# Patient Record
Sex: Male | Born: 1960 | Race: Black or African American | Hispanic: No | Marital: Single | Smoking: Never smoker
Health system: Southern US, Community
[De-identification: ages and names within clinical notes are randomized; demographics above are authoritative.]

## PROBLEM LIST (undated history)

## (undated) DIAGNOSIS — E119 Type 2 diabetes mellitus without complications: Secondary | ICD-10-CM

## (undated) DIAGNOSIS — I480 Paroxysmal atrial fibrillation: Secondary | ICD-10-CM

## (undated) DIAGNOSIS — I428 Other cardiomyopathies: Secondary | ICD-10-CM

## (undated) DIAGNOSIS — K633 Ulcer of intestine: Secondary | ICD-10-CM

## (undated) DIAGNOSIS — E669 Obesity, unspecified: Secondary | ICD-10-CM

## (undated) DIAGNOSIS — I219 Acute myocardial infarction, unspecified: Secondary | ICD-10-CM

## (undated) DIAGNOSIS — K559 Vascular disorder of intestine, unspecified: Secondary | ICD-10-CM

## (undated) DIAGNOSIS — H269 Unspecified cataract: Secondary | ICD-10-CM

## (undated) DIAGNOSIS — N183 Chronic kidney disease, stage 3 unspecified: Secondary | ICD-10-CM

## (undated) DIAGNOSIS — D126 Benign neoplasm of colon, unspecified: Secondary | ICD-10-CM

## (undated) DIAGNOSIS — E785 Hyperlipidemia, unspecified: Secondary | ICD-10-CM

## (undated) DIAGNOSIS — H35039 Hypertensive retinopathy, unspecified eye: Secondary | ICD-10-CM

## (undated) DIAGNOSIS — I2510 Atherosclerotic heart disease of native coronary artery without angina pectoris: Secondary | ICD-10-CM

## (undated) DIAGNOSIS — G4730 Sleep apnea, unspecified: Secondary | ICD-10-CM

## (undated) DIAGNOSIS — Z86010 Personal history of colonic polyps: Secondary | ICD-10-CM

## (undated) DIAGNOSIS — J352 Hypertrophy of adenoids: Secondary | ICD-10-CM

## (undated) DIAGNOSIS — M1990 Unspecified osteoarthritis, unspecified site: Secondary | ICD-10-CM

## (undated) DIAGNOSIS — K219 Gastro-esophageal reflux disease without esophagitis: Secondary | ICD-10-CM

## (undated) DIAGNOSIS — I10 Essential (primary) hypertension: Secondary | ICD-10-CM

## (undated) DIAGNOSIS — E11319 Type 2 diabetes mellitus with unspecified diabetic retinopathy without macular edema: Secondary | ICD-10-CM

## (undated) HISTORY — PX: POLYPECTOMY: SHX149

## (undated) HISTORY — DX: Hyperlipidemia, unspecified: E78.5

## (undated) HISTORY — DX: Chronic kidney disease, stage 3 unspecified: N18.30

## (undated) HISTORY — DX: Paroxysmal atrial fibrillation: I48.0

## (undated) HISTORY — DX: Benign neoplasm of colon, unspecified: D12.6

## (undated) HISTORY — DX: Ulcer of intestine: K63.3

## (undated) HISTORY — DX: Type 2 diabetes mellitus with unspecified diabetic retinopathy without macular edema: E11.319

## (undated) HISTORY — DX: Hypertrophy of adenoids: J35.2

## (undated) HISTORY — PX: COLONOSCOPY: SHX174

## (undated) HISTORY — DX: Obesity, unspecified: E66.9

## (undated) HISTORY — DX: Essential (primary) hypertension: I10

## (undated) HISTORY — PX: MUSCLE BIOPSY: SHX716

## (undated) HISTORY — DX: Gastro-esophageal reflux disease without esophagitis: K21.9

## (undated) HISTORY — DX: Personal history of colonic polyps: Z86.010

## (undated) HISTORY — DX: Unspecified cataract: H26.9

## (undated) HISTORY — DX: Hypertensive retinopathy, unspecified eye: H35.039

## (undated) HISTORY — DX: Atherosclerotic heart disease of native coronary artery without angina pectoris: I25.10

## (undated) HISTORY — DX: Unspecified osteoarthritis, unspecified site: M19.90

## (undated) HISTORY — DX: Sleep apnea, unspecified: G47.30

## (undated) HISTORY — PX: FOREARM SURGERY: SHX651

## (undated) HISTORY — DX: Vascular disorder of intestine, unspecified: K55.9

## (undated) MED ORDER — INSULIN LISPRO 100 UNIT/ML INJECTION
100 unit/mL | Freq: Three times a day (TID) | SUBCUTANEOUS | Status: DC
Start: ? — End: 2013-08-06

---

## 1998-05-24 ENCOUNTER — Emergency Department (HOSPITAL_COMMUNITY): Admission: EM | Admit: 1998-05-24 | Discharge: 1998-05-24 | Payer: Self-pay | Admitting: Emergency Medicine

## 2001-05-16 ENCOUNTER — Emergency Department (HOSPITAL_COMMUNITY): Admission: EM | Admit: 2001-05-16 | Discharge: 2001-05-16 | Payer: Self-pay | Admitting: Emergency Medicine

## 2001-05-16 ENCOUNTER — Encounter: Payer: Self-pay | Admitting: Emergency Medicine

## 2001-09-20 ENCOUNTER — Emergency Department (HOSPITAL_COMMUNITY): Admission: EM | Admit: 2001-09-20 | Discharge: 2001-09-21 | Payer: Self-pay | Admitting: Emergency Medicine

## 2001-09-20 ENCOUNTER — Encounter: Payer: Self-pay | Admitting: Emergency Medicine

## 2001-09-22 ENCOUNTER — Emergency Department (HOSPITAL_COMMUNITY): Admission: EM | Admit: 2001-09-22 | Discharge: 2001-09-23 | Payer: Self-pay | Admitting: Emergency Medicine

## 2001-09-22 ENCOUNTER — Encounter: Payer: Self-pay | Admitting: Emergency Medicine

## 2001-11-10 ENCOUNTER — Encounter: Payer: Self-pay | Admitting: Emergency Medicine

## 2001-11-10 ENCOUNTER — Emergency Department (HOSPITAL_COMMUNITY): Admission: EM | Admit: 2001-11-10 | Discharge: 2001-11-10 | Payer: Self-pay | Admitting: Emergency Medicine

## 2002-01-06 ENCOUNTER — Encounter: Payer: Self-pay | Admitting: Emergency Medicine

## 2002-01-06 ENCOUNTER — Emergency Department (HOSPITAL_COMMUNITY): Admission: EM | Admit: 2002-01-06 | Discharge: 2002-01-06 | Payer: Self-pay | Admitting: Emergency Medicine

## 2002-06-20 ENCOUNTER — Ambulatory Visit (HOSPITAL_COMMUNITY): Admission: RE | Admit: 2002-06-20 | Discharge: 2002-06-20 | Payer: Self-pay | Admitting: Internal Medicine

## 2002-06-20 ENCOUNTER — Encounter: Payer: Self-pay | Admitting: Internal Medicine

## 2004-05-03 ENCOUNTER — Emergency Department (HOSPITAL_COMMUNITY): Admission: EM | Admit: 2004-05-03 | Discharge: 2004-05-03 | Payer: Self-pay | Admitting: Emergency Medicine

## 2006-01-01 ENCOUNTER — Emergency Department (HOSPITAL_COMMUNITY): Admission: EM | Admit: 2006-01-01 | Discharge: 2006-01-01 | Payer: Self-pay | Admitting: Emergency Medicine

## 2006-05-12 ENCOUNTER — Encounter: Admission: RE | Admit: 2006-05-12 | Discharge: 2006-05-12 | Payer: Self-pay | Admitting: Neurology

## 2006-06-17 ENCOUNTER — Encounter: Admission: RE | Admit: 2006-06-17 | Discharge: 2006-06-17 | Payer: Self-pay | Admitting: Neurology

## 2007-10-03 ENCOUNTER — Emergency Department (HOSPITAL_COMMUNITY): Admission: EM | Admit: 2007-10-03 | Discharge: 2007-10-04 | Payer: Self-pay | Admitting: Emergency Medicine

## 2009-08-03 ENCOUNTER — Emergency Department (HOSPITAL_COMMUNITY): Admission: EM | Admit: 2009-08-03 | Discharge: 2009-08-03 | Payer: Self-pay | Admitting: Family Medicine

## 2009-10-20 ENCOUNTER — Emergency Department (HOSPITAL_COMMUNITY): Admission: EM | Admit: 2009-10-20 | Discharge: 2009-10-20 | Payer: Self-pay | Admitting: Family Medicine

## 2009-10-20 ENCOUNTER — Observation Stay (HOSPITAL_COMMUNITY): Admission: EM | Admit: 2009-10-20 | Discharge: 2009-10-21 | Payer: Self-pay | Admitting: Emergency Medicine

## 2010-02-12 ENCOUNTER — Inpatient Hospital Stay (HOSPITAL_COMMUNITY): Admission: EM | Admit: 2010-02-12 | Discharge: 2010-02-16 | Payer: Self-pay | Admitting: Emergency Medicine

## 2010-07-04 DIAGNOSIS — D126 Benign neoplasm of colon, unspecified: Secondary | ICD-10-CM

## 2010-07-04 HISTORY — DX: Benign neoplasm of colon, unspecified: D12.6

## 2010-09-17 LAB — COMPREHENSIVE METABOLIC PANEL
ALT: 32 U/L (ref 0–53)
AST: 28 U/L (ref 0–37)
Albumin: 3.1 g/dL — ABNORMAL LOW (ref 3.5–5.2)
Alkaline Phosphatase: 85 U/L (ref 39–117)
BUN: 9 mg/dL (ref 6–23)
CO2: 25 mEq/L (ref 19–32)
Calcium: 9.1 mg/dL (ref 8.4–10.5)
Chloride: 101 mEq/L (ref 96–112)
Creatinine, Ser: 1.17 mg/dL (ref 0.4–1.5)
GFR calc Af Amer: >60 mL/min (ref >60)
GFR calc non Af Amer: >60 mL/min (ref >60)
Glucose, Bld: 208 mg/dL — ABNORMAL HIGH (ref 70–99)
Potassium: 3.7 mEq/L (ref 3.5–5.1)
Sodium: 137 mEq/L (ref 135–145)
Total Bilirubin: 0.4 mg/dL (ref 0.3–1.2)
Total Protein: 5.9 g/dL — ABNORMAL LOW (ref 6.0–8.3)

## 2010-09-17 LAB — DIFFERENTIAL
Basophils Absolute: 0 10*3/uL (ref 0.0–0.1)
Basophils Absolute: 0 10*3/uL (ref 0.0–0.1)
Basophils Relative: 0 % (ref 0–1)
Basophils Relative: 0 % (ref 0–1)
Eosinophils Absolute: 0.1 10*3/uL (ref 0.0–0.7)
Eosinophils Absolute: 0.1 10*3/uL (ref 0.0–0.7)
Eosinophils Relative: 2 % (ref 0–5)
Eosinophils Relative: 1 % (ref 0–5)
Lymphocytes Relative: 42 % (ref 12–46)
Lymphocytes Relative: 36 % (ref 12–46)
Lymphs Abs: 2.7 10*3/uL (ref 0.7–4.0)
Lymphs Abs: 2.7 10*3/uL (ref 0.7–4.0)
Monocytes Absolute: 0.5 10*3/uL (ref 0.1–1.0)
Monocytes Absolute: 0.4 10*3/uL (ref 0.1–1.0)
Monocytes Relative: 8 % (ref 3–12)
Monocytes Relative: 6 % (ref 3–12)
Neutro Abs: 3.1 10*3/uL (ref 1.7–7.7)
Neutro Abs: 4.3 10*3/uL (ref 1.7–7.7)
Neutrophils Relative %: 48 % (ref 43–77)
Neutrophils Relative %: 57 % (ref 43–77)

## 2010-09-17 LAB — GLUCOSE, CAPILLARY
Glucose-Capillary: 162 mg/dL — ABNORMAL HIGH (ref 70–99)
Glucose-Capillary: 168 mg/dL — ABNORMAL HIGH (ref 70–99)
Glucose-Capillary: 215 mg/dL — ABNORMAL HIGH (ref 70–99)
Glucose-Capillary: 216 mg/dL — ABNORMAL HIGH (ref 70–99)
Glucose-Capillary: 246 mg/dL — ABNORMAL HIGH (ref 70–99)
Glucose-Capillary: 115 mg/dL — ABNORMAL HIGH (ref 70–99)
Glucose-Capillary: 152 mg/dL — ABNORMAL HIGH (ref 70–99)
Glucose-Capillary: 157 mg/dL — ABNORMAL HIGH (ref 70–99)
Glucose-Capillary: 224 mg/dL — ABNORMAL HIGH (ref 70–99)
Glucose-Capillary: 258 mg/dL — ABNORMAL HIGH (ref 70–99)
Glucose-Capillary: 259 mg/dL — ABNORMAL HIGH (ref 70–99)
Glucose-Capillary: 299 mg/dL — ABNORMAL HIGH (ref 70–99)
Glucose-Capillary: 310 mg/dL — ABNORMAL HIGH (ref 70–99)
Glucose-Capillary: 358 mg/dL — ABNORMAL HIGH (ref 70–99)
Glucose-Capillary: 363 mg/dL — ABNORMAL HIGH (ref 70–99)
Glucose-Capillary: 393 mg/dL — ABNORMAL HIGH (ref 70–99)
Glucose-Capillary: 446 mg/dL — ABNORMAL HIGH (ref 70–99)
Glucose-Capillary: 480 mg/dL — ABNORMAL HIGH (ref 70–99)
Glucose-Capillary: 519 mg/dL — ABNORMAL HIGH (ref 70–99)

## 2010-09-17 LAB — POCT CARDIAC MARKERS
CKMB, poc: 1.2 ng/mL (ref 1.0–8.0)
CKMB, poc: 2.1 ng/mL (ref 1.0–8.0)
Myoglobin, poc: 143 ng/mL (ref 12–200)
Myoglobin, poc: 156 ng/mL (ref 12–200)
Troponin i, poc: <0.05 ng/mL (ref 0.00–0.09)
Troponin i, poc: <0.05 ng/mL (ref 0.00–0.09)

## 2010-09-17 LAB — POCT I-STAT, CHEM 8
BUN: 17 mg/dL (ref 6–23)
Calcium, Ion: 1.04 mmol/L — ABNORMAL LOW (ref 1.12–1.32)
Chloride: 102 mEq/L (ref 96–112)
Creatinine, Ser: 1.3 mg/dL (ref 0.4–1.5)
Glucose, Bld: 531 mg/dL — ABNORMAL HIGH (ref 70–99)
HCT: 49 % (ref 39.0–52.0)
Hemoglobin: 16.7 g/dL (ref 13.0–17.0)
Potassium: 4.2 mEq/L (ref 3.5–5.1)
Sodium: 130 mEq/L — ABNORMAL LOW (ref 135–145)
TCO2: 22 mmol/L (ref 0–100)

## 2010-09-17 LAB — URINALYSIS, ROUTINE W REFLEX MICROSCOPIC
Bilirubin Urine: NEGATIVE
Glucose, UA: >1000 mg/dL — AB
Hgb urine dipstick: NEGATIVE
Ketones, ur: 15 mg/dL — AB
Nitrite: NEGATIVE
Protein, ur: NEGATIVE mg/dL
Specific Gravity, Urine: 1.038 — ABNORMAL HIGH (ref 1.005–1.030)
Urobilinogen, UA: 0.2 mg/dL (ref 0.0–1.0)
pH: 6 (ref 5.0–8.0)

## 2010-09-17 LAB — CBC
HCT: 44.6 % (ref 39.0–52.0)
HCT: 45.5 % (ref 39.0–52.0)
Hemoglobin: 15.8 g/dL (ref 13.0–17.0)
Hemoglobin: 16.3 g/dL (ref 13.0–17.0)
MCH: 30.8 pg (ref 26.0–34.0)
MCH: 30.4 pg (ref 26.0–34.0)
MCHC: 35.4 g/dL (ref 30.0–36.0)
MCHC: 35.8 g/dL (ref 30.0–36.0)
MCV: 86.9 fL (ref 78.0–100.0)
MCV: 84.9 fL (ref 78.0–100.0)
Platelets: 198 10*3/uL (ref 150–400)
Platelets: 207 10*3/uL (ref 150–400)
RBC: 5.13 MIL/uL (ref 4.22–5.81)
RBC: 5.36 MIL/uL (ref 4.22–5.81)
RDW: 12.7 % (ref 11.5–15.5)
RDW: 12.3 % (ref 11.5–15.5)
WBC: 6.4 10*3/uL (ref 4.0–10.5)
WBC: 7.5 10*3/uL (ref 4.0–10.5)

## 2010-09-17 LAB — URINE MICROSCOPIC-ADD ON

## 2010-09-17 LAB — URINE CULTURE
Colony Count: 85000
Culture  Setup Time: 201108130309

## 2010-09-17 LAB — HEMOGLOBIN A1C
Hgb A1c MFr Bld: 13.4 % — ABNORMAL HIGH (ref <5.7)
Hgb A1c MFr Bld: 13.6 % — ABNORMAL HIGH (ref <5.7)
Mean Plasma Glucose: 338 mg/dL — ABNORMAL HIGH (ref <117)
Mean Plasma Glucose: 344 mg/dL — ABNORMAL HIGH (ref <117)

## 2010-09-17 LAB — GLUCOSE, RANDOM: Glucose, Bld: 391 mg/dL — ABNORMAL HIGH (ref 70–99)

## 2010-09-17 LAB — GC/CHLAMYDIA PROBE AMP, URINE
Chlamydia, Swab/Urine, PCR: NEGATIVE
GC Probe Amp, Urine: NEGATIVE

## 2010-09-17 LAB — C-PEPTIDE: C-Peptide: 1.33 ng/mL (ref 0.80–3.90)

## 2010-09-17 LAB — URIC ACID: Uric Acid, Serum: 4.8 mg/dL (ref 4.0–7.8)

## 2010-09-17 LAB — HIV ANTIBODY (ROUTINE TESTING W REFLEX): HIV: NONREACTIVE

## 2010-09-17 LAB — C-REACTIVE PROTEIN: CRP: 1.4 mg/dL — ABNORMAL HIGH (ref <0.6)

## 2010-09-17 LAB — SEDIMENTATION RATE: Sed Rate: 7 mm/hr (ref 0–16)

## 2010-09-19 LAB — POCT I-STAT, CHEM 8
BUN: 11 mg/dL (ref 6–23)
Calcium, Ion: 1.1 mmol/L — ABNORMAL LOW (ref 1.12–1.32)
Chloride: 103 mEq/L (ref 96–112)
Creatinine, Ser: 1.5 mg/dL (ref 0.4–1.5)
Glucose, Bld: 266 mg/dL — ABNORMAL HIGH (ref 70–99)
HCT: 53 % — ABNORMAL HIGH (ref 39.0–52.0)
Hemoglobin: 18 g/dL — ABNORMAL HIGH (ref 13.0–17.0)
Potassium: 4 mEq/L (ref 3.5–5.1)
Sodium: 134 mEq/L — ABNORMAL LOW (ref 135–145)
TCO2: 25 mmol/L (ref 0–100)

## 2010-09-21 LAB — COMPREHENSIVE METABOLIC PANEL
ALT: 28 U/L (ref 0–53)
AST: 24 U/L (ref 0–37)
Albumin: 3.2 g/dL — ABNORMAL LOW (ref 3.5–5.2)
Alkaline Phosphatase: 70 U/L (ref 39–117)
BUN: 11 mg/dL (ref 6–23)
CO2: 27 mEq/L (ref 19–32)
Calcium: 8.6 mg/dL (ref 8.4–10.5)
Chloride: 107 mEq/L (ref 96–112)
Creatinine, Ser: 1.14 mg/dL (ref 0.4–1.5)
GFR calc Af Amer: >60 mL/min (ref >60)
GFR calc non Af Amer: >60 mL/min (ref >60)
Glucose, Bld: 249 mg/dL — ABNORMAL HIGH (ref 70–99)
Potassium: 4 mEq/L (ref 3.5–5.1)
Sodium: 137 mEq/L (ref 135–145)
Total Bilirubin: 0.7 mg/dL (ref 0.3–1.2)
Total Protein: 5.7 g/dL — ABNORMAL LOW (ref 6.0–8.3)

## 2010-09-21 LAB — POCT CARDIAC MARKERS
CKMB, poc: 1 ng/mL (ref 1.0–8.0)
CKMB, poc: <1 ng/mL — ABNORMAL LOW (ref 1.0–8.0)
Myoglobin, poc: 62.7 ng/mL (ref 12–200)
Myoglobin, poc: 65.8 ng/mL (ref 12–200)
Troponin i, poc: <0.05 ng/mL (ref 0.00–0.09)
Troponin i, poc: <0.05 ng/mL (ref 0.00–0.09)

## 2010-09-21 LAB — POCT I-STAT, CHEM 8
BUN: 10 mg/dL (ref 6–23)
Calcium, Ion: 1.16 mmol/L (ref 1.12–1.32)
Chloride: 104 mEq/L (ref 96–112)
Creatinine, Ser: 1.1 mg/dL (ref 0.4–1.5)
Glucose, Bld: 350 mg/dL — ABNORMAL HIGH (ref 70–99)
HCT: 48 % (ref 39.0–52.0)
Hemoglobin: 16.3 g/dL (ref 13.0–17.0)
Potassium: 3.6 mEq/L (ref 3.5–5.1)
Sodium: 136 mEq/L (ref 135–145)
TCO2: 23 mmol/L (ref 0–100)

## 2010-09-21 LAB — URINALYSIS, ROUTINE W REFLEX MICROSCOPIC
Bilirubin Urine: NEGATIVE
Glucose, UA: >1000 mg/dL — AB
Hgb urine dipstick: NEGATIVE
Ketones, ur: NEGATIVE mg/dL
Leukocytes, UA: NEGATIVE
Nitrite: NEGATIVE
Protein, ur: NEGATIVE mg/dL
Specific Gravity, Urine: 1.025 (ref 1.005–1.030)
Urobilinogen, UA: 0.2 mg/dL (ref 0.0–1.0)
pH: 5.5 (ref 5.0–8.0)

## 2010-09-21 LAB — GLUCOSE, CAPILLARY
Glucose-Capillary: 241 mg/dL — ABNORMAL HIGH (ref 70–99)
Glucose-Capillary: 253 mg/dL — ABNORMAL HIGH (ref 70–99)
Glucose-Capillary: 260 mg/dL — ABNORMAL HIGH (ref 70–99)
Glucose-Capillary: 272 mg/dL — ABNORMAL HIGH (ref 70–99)

## 2010-09-21 LAB — URINE MICROSCOPIC-ADD ON

## 2010-09-21 LAB — DIFFERENTIAL
Basophils Absolute: 0.1 10*3/uL (ref 0.0–0.1)
Basophils Relative: 1 % (ref 0–1)
Eosinophils Absolute: 0.1 10*3/uL (ref 0.0–0.7)
Eosinophils Relative: 1 % (ref 0–5)
Lymphocytes Relative: 32 % (ref 12–46)
Lymphs Abs: 2.5 10*3/uL (ref 0.7–4.0)
Monocytes Absolute: 0.3 10*3/uL (ref 0.1–1.0)
Monocytes Relative: 4 % (ref 3–12)
Neutro Abs: 5 10*3/uL (ref 1.7–7.7)
Neutrophils Relative %: 63 % (ref 43–77)

## 2010-09-21 LAB — CBC
HCT: 42.3 % (ref 39.0–52.0)
Hemoglobin: 14.4 g/dL (ref 13.0–17.0)
MCHC: 34 g/dL (ref 30.0–36.0)
MCV: 91.9 fL (ref 78.0–100.0)
Platelets: 186 10*3/uL (ref 150–400)
RBC: 4.6 MIL/uL (ref 4.22–5.81)
RDW: 13.4 % (ref 11.5–15.5)
WBC: 7.9 10*3/uL (ref 4.0–10.5)

## 2010-09-21 LAB — LIPASE, BLOOD: Lipase: 30 U/L (ref 11–59)

## 2010-12-15 ENCOUNTER — Encounter (HOSPITAL_COMMUNITY)
Admission: RE | Admit: 2010-12-15 | Discharge: 2010-12-15 | Disposition: A | Discharge status: Home / Self Care | Payer: BC Managed Care – PPO | Source: Ambulatory Visit | Attending: General Surgery | Admitting: General Surgery

## 2010-12-15 LAB — URINALYSIS, ROUTINE W REFLEX MICROSCOPIC
Bilirubin Urine: NEGATIVE
Glucose, UA: >1000 mg/dL — AB
Hgb urine dipstick: NEGATIVE
Ketones, ur: NEGATIVE mg/dL
Leukocytes, UA: NEGATIVE
Nitrite: NEGATIVE
Protein, ur: NEGATIVE mg/dL
Specific Gravity, Urine: 1.032 — ABNORMAL HIGH (ref 1.005–1.030)
Urobilinogen, UA: 0.2 mg/dL (ref 0.0–1.0)
pH: 5.5 (ref 5.0–8.0)

## 2010-12-15 LAB — CBC
HCT: 43.8 % (ref 39.0–52.0)
Hemoglobin: 15.5 g/dL (ref 13.0–17.0)
MCH: 30.6 pg (ref 26.0–34.0)
MCHC: 35.4 g/dL (ref 30.0–36.0)
MCV: 86.6 fL (ref 78.0–100.0)
Platelets: 220 10*3/uL (ref 150–400)
RBC: 5.06 MIL/uL (ref 4.22–5.81)
RDW: 12.7 % (ref 11.5–15.5)
WBC: 8.5 10*3/uL (ref 4.0–10.5)

## 2010-12-15 LAB — COMPREHENSIVE METABOLIC PANEL
ALT: 23 U/L (ref 0–53)
AST: 14 U/L (ref 0–37)
Albumin: 3.8 g/dL (ref 3.5–5.2)
Alkaline Phosphatase: 102 U/L (ref 39–117)
BUN: 17 mg/dL (ref 6–23)
CO2: 26 mEq/L (ref 19–32)
Calcium: 9.8 mg/dL (ref 8.4–10.5)
Chloride: 100 mEq/L (ref 96–112)
Creatinine, Ser: 1.25 mg/dL (ref 0.4–1.5)
GFR calc Af Amer: >60 mL/min (ref >60)
GFR calc non Af Amer: >60 mL/min (ref >60)
Glucose, Bld: 311 mg/dL — ABNORMAL HIGH (ref 70–99)
Potassium: 4.3 mEq/L (ref 3.5–5.1)
Sodium: 137 mEq/L (ref 135–145)
Total Bilirubin: 0.3 mg/dL (ref 0.3–1.2)
Total Protein: 7 g/dL (ref 6.0–8.3)

## 2010-12-15 LAB — SURGICAL PCR SCREEN
MRSA, PCR: NEGATIVE
Staphylococcus aureus: NEGATIVE

## 2010-12-15 LAB — DIFFERENTIAL
Basophils Absolute: 0 10*3/uL (ref 0.0–0.1)
Basophils Relative: 0 % (ref 0–1)
Eosinophils Absolute: 0.1 10*3/uL (ref 0.0–0.7)
Eosinophils Relative: 1 % (ref 0–5)
Lymphocytes Relative: 33 % (ref 12–46)
Lymphs Abs: 2.8 10*3/uL (ref 0.7–4.0)
Monocytes Absolute: 0.4 10*3/uL (ref 0.1–1.0)
Monocytes Relative: 5 % (ref 3–12)
Neutro Abs: 5.1 10*3/uL (ref 1.7–7.7)
Neutrophils Relative %: 61 % (ref 43–77)

## 2010-12-15 LAB — URINE MICROSCOPIC-ADD ON

## 2010-12-16 ENCOUNTER — Other Ambulatory Visit (INDEPENDENT_AMBULATORY_CARE_PROVIDER_SITE_OTHER): Payer: Self-pay | Admitting: General Surgery

## 2010-12-16 ENCOUNTER — Ambulatory Visit (HOSPITAL_COMMUNITY)
Admission: RE | Admit: 2010-12-16 | Discharge: 2010-12-16 | Disposition: A | Discharge status: Home / Self Care | Payer: BC Managed Care – PPO | Source: Ambulatory Visit | Attending: General Surgery | Admitting: General Surgery

## 2010-12-16 DIAGNOSIS — I10 Essential (primary) hypertension: Secondary | ICD-10-CM | POA: Insufficient documentation

## 2010-12-16 DIAGNOSIS — G4733 Obstructive sleep apnea (adult) (pediatric): Secondary | ICD-10-CM | POA: Insufficient documentation

## 2010-12-16 DIAGNOSIS — IMO0001 Reserved for inherently not codable concepts without codable children: Secondary | ICD-10-CM | POA: Insufficient documentation

## 2010-12-16 DIAGNOSIS — E119 Type 2 diabetes mellitus without complications: Secondary | ICD-10-CM | POA: Insufficient documentation

## 2010-12-16 LAB — GLUCOSE, CAPILLARY
Glucose-Capillary: 270 mg/dL — ABNORMAL HIGH (ref 70–99)
Glucose-Capillary: 249 mg/dL — ABNORMAL HIGH (ref 70–99)

## 2010-12-19 NOTE — Op Note (Signed)
  Calvin [name], WHITENACK NO.:  [id number]  MEDICAL RECORD NO.:  [id number]  LOCATION:  [facility]                         FACILITY:  [facility]  PHYSICIAN:  Edsel Petrin. [name], M.D.DATE OF BIRTH:  September 23, 1960  DATE OF PROCEDURE:  12/16/2010 DATE OF DISCHARGE:  12/16/2010                              OPERATIVE REPORT   PREOPERATIVE DIAGNOSIS:  Myositis.  POSTOPERATIVE DIAGNOSIS:  Myositis.  OPERATION PERFORMED:  Right thigh quadriceps muscle biopsy.  SURGEON:  Edsel Petrin. [name], MD  OPERATIVE INDICATIONS:  This is a 50 year old African American male who has been having problems with muscle pain, intermittent weakness and spasm.  He has been found to have elevated CK levels and elevated CRP levels.  He is referred for muscle biopsy by his rheumatologist.  He has been counseled as an outpatient.  He is brought to the operating room electively.  OPERATIVE TECHNIQUE:  Following induction with general LMA anesthetic, the patient's right thigh was prepped and draped in sterile fashion. Intravenous antibiotics were given. Surgical time-out was held identifying correct patient, correct procedure, and correct site. Xylocaine 1% with epinephrine was used as a local infiltration anesthetic.  A slightly oblique incision was made overlying the anterior quadriceps muscle compartment in the proximal right thigh.  The deep investing fascia was exposed and incised.  Self-retaining retractors were placed.  According to the protocol, I isolated a 2 cm x 5 cm slip of muscle and completely freed  this up.  It was clamped proximally and distally and the muscle was removed and placed in a moistened Telfa pad.  This was sent promptly to the pathology lab with the attached clinical history and lab work for processing and transferred for electron microscopy. The ends of the cut muscle were tied off with 2-0 Vicryl ties. Hemostasis was excellent.  The wound was irrigated with saline.   The deeper tissues and fascia were closed with interrupted sutures of 3-0 Vicryl.  Superficial subcutaneous tissue was closed with 3-0 Vicryl sutures.  Skin was closed with a running subcuticular suture of 4-0 Monocryl and Dermabond.  The patient tolerated the procedure well, was taken to the recovery room in stable condition. Estimated blood loss was about 10 mL or less.  Complications none. Sponge and instrument counts were correct.     Edsel Petrin. [name], M.D.     HMI/MEDQ  D:  12/16/2010  T:  12/16/2010  Job:  [id number]  cc:   Dr. Tobie Lords Dr. Lutricia Feil  Electronically Signed by Fanny Skates M.D. on 12/19/2010 11:37:30 AM

## 2010-12-24 ENCOUNTER — Encounter (INDEPENDENT_AMBULATORY_CARE_PROVIDER_SITE_OTHER): Payer: Self-pay | Admitting: General Surgery

## 2010-12-28 ENCOUNTER — Encounter (INDEPENDENT_AMBULATORY_CARE_PROVIDER_SITE_OTHER): Payer: Self-pay | Admitting: General Surgery

## 2010-12-28 DIAGNOSIS — G8918 Other acute postprocedural pain: Secondary | ICD-10-CM

## 2010-12-28 NOTE — Progress Notes (Signed)
Patient walk in requesting RTW note - incision closed - no signs/symptoms of infection - patient c/o soreness on ambulation - RTW note written for 01/03/11, patient evaluated and note signed by Michela Pitcher PA

## 2010-12-28 NOTE — Progress Notes (Signed)
This encounter was created in error - please disregard.

## 2010-12-30 ENCOUNTER — Encounter (INDEPENDENT_AMBULATORY_CARE_PROVIDER_SITE_OTHER): Payer: BC Managed Care – PPO | Admitting: General Surgery

## 2011-01-03 ENCOUNTER — Telehealth (INDEPENDENT_AMBULATORY_CARE_PROVIDER_SITE_OTHER): Payer: Self-pay | Admitting: General Surgery

## 2011-01-03 NOTE — Telephone Encounter (Signed)
Pt given  rtw slip-- called pt/[id number] eh

## 2011-01-03 NOTE — Telephone Encounter (Signed)
Pt called not ready to go back to work today needs RTW form pls call

## 2011-03-29 LAB — GC/CHLAMYDIA PROBE AMP, GENITAL
Chlamydia, DNA Probe: NEGATIVE
GC Probe Amp, Genital: NEGATIVE

## 2011-03-29 LAB — RPR: RPR Ser Ql: NONREACTIVE

## 2011-04-05 ENCOUNTER — Encounter: Payer: Self-pay | Admitting: Internal Medicine

## 2011-04-08 ENCOUNTER — Encounter (INDEPENDENT_AMBULATORY_CARE_PROVIDER_SITE_OTHER): Payer: Self-pay | Admitting: General Surgery

## 2011-04-11 ENCOUNTER — Encounter (INDEPENDENT_AMBULATORY_CARE_PROVIDER_SITE_OTHER): Payer: BC Managed Care – PPO | Admitting: General Surgery

## 2011-04-15 ENCOUNTER — Encounter (INDEPENDENT_AMBULATORY_CARE_PROVIDER_SITE_OTHER): Payer: Self-pay | Admitting: General Surgery

## 2011-04-29 ENCOUNTER — Encounter: Payer: Self-pay | Admitting: Internal Medicine

## 2011-04-29 ENCOUNTER — Ambulatory Visit (AMBULATORY_SURGERY_CENTER): Payer: BC Managed Care – PPO | Admitting: *Deleted

## 2011-04-29 ENCOUNTER — Other Ambulatory Visit: Payer: Self-pay | Admitting: Internal Medicine

## 2011-04-29 VITALS — Ht 65.0 in | Wt 195.7 lb

## 2011-04-29 DIAGNOSIS — Z1211 Encounter for screening for malignant neoplasm of colon: Secondary | ICD-10-CM

## 2011-04-29 MED ORDER — PEG-KCL-NACL-NASULF-NA ASC-C 100 G PO SOLR: ORAL | Status: DC

## 2011-04-30 ENCOUNTER — Emergency Department (HOSPITAL_COMMUNITY)
Admission: EM | Admit: 2011-04-30 | Discharge: 2011-04-30 | Disposition: A | Discharge status: Home / Self Care | Payer: BC Managed Care – PPO | Attending: Emergency Medicine | Admitting: Emergency Medicine

## 2011-04-30 ENCOUNTER — Emergency Department (HOSPITAL_COMMUNITY): Payer: BC Managed Care – PPO

## 2011-04-30 DIAGNOSIS — R059 Cough, unspecified: Secondary | ICD-10-CM | POA: Insufficient documentation

## 2011-04-30 DIAGNOSIS — R05 Cough: Secondary | ICD-10-CM | POA: Insufficient documentation

## 2011-04-30 DIAGNOSIS — Z7982 Long term (current) use of aspirin: Secondary | ICD-10-CM | POA: Insufficient documentation

## 2011-04-30 DIAGNOSIS — Z79899 Other long term (current) drug therapy: Secondary | ICD-10-CM | POA: Insufficient documentation

## 2011-04-30 DIAGNOSIS — I10 Essential (primary) hypertension: Secondary | ICD-10-CM | POA: Insufficient documentation

## 2011-04-30 DIAGNOSIS — M25519 Pain in unspecified shoulder: Secondary | ICD-10-CM | POA: Insufficient documentation

## 2011-04-30 DIAGNOSIS — Z794 Long term (current) use of insulin: Secondary | ICD-10-CM | POA: Insufficient documentation

## 2011-04-30 DIAGNOSIS — E119 Type 2 diabetes mellitus without complications: Secondary | ICD-10-CM | POA: Insufficient documentation

## 2011-04-30 DIAGNOSIS — R071 Chest pain on breathing: Secondary | ICD-10-CM | POA: Insufficient documentation

## 2011-04-30 DIAGNOSIS — IMO0001 Reserved for inherently not codable concepts without codable children: Secondary | ICD-10-CM | POA: Insufficient documentation

## 2011-04-30 DIAGNOSIS — M2560 Stiffness of unspecified joint, not elsewhere classified: Secondary | ICD-10-CM | POA: Insufficient documentation

## 2011-04-30 DIAGNOSIS — M2550 Pain in unspecified joint: Secondary | ICD-10-CM | POA: Insufficient documentation

## 2011-04-30 DIAGNOSIS — R112 Nausea with vomiting, unspecified: Secondary | ICD-10-CM | POA: Insufficient documentation

## 2011-04-30 DIAGNOSIS — M79609 Pain in unspecified limb: Secondary | ICD-10-CM | POA: Insufficient documentation

## 2011-04-30 DIAGNOSIS — R6883 Chills (without fever): Secondary | ICD-10-CM | POA: Insufficient documentation

## 2011-04-30 LAB — DIFFERENTIAL
Basophils Absolute: 0 10*3/uL (ref 0.0–0.1)
Basophils Relative: 0 % (ref 0–1)
Eosinophils Absolute: 0.1 10*3/uL (ref 0.0–0.7)
Eosinophils Relative: 1 % (ref 0–5)
Lymphocytes Relative: 35 % (ref 12–46)
Lymphs Abs: 3.2 10*3/uL (ref 0.7–4.0)
Monocytes Absolute: 0.5 10*3/uL (ref 0.1–1.0)
Monocytes Relative: 6 % (ref 3–12)
Neutro Abs: 5.2 10*3/uL (ref 1.7–7.7)
Neutrophils Relative %: 58 % (ref 43–77)

## 2011-04-30 LAB — COMPREHENSIVE METABOLIC PANEL
ALT: 21 U/L (ref 0–53)
AST: 19 U/L (ref 0–37)
Albumin: 3.8 g/dL (ref 3.5–5.2)
Alkaline Phosphatase: 96 U/L (ref 39–117)
BUN: 15 mg/dL (ref 6–23)
CO2: 23 mEq/L (ref 19–32)
Calcium: 9.9 mg/dL (ref 8.4–10.5)
Chloride: 100 mEq/L (ref 96–112)
Creatinine, Ser: 1.11 mg/dL (ref 0.50–1.35)
GFR calc Af Amer: 87 mL/min — ABNORMAL LOW (ref >90)
GFR calc non Af Amer: 75 mL/min — ABNORMAL LOW (ref >90)
Glucose, Bld: 380 mg/dL — ABNORMAL HIGH (ref 70–99)
Potassium: 4 mEq/L (ref 3.5–5.1)
Sodium: 134 mEq/L — ABNORMAL LOW (ref 135–145)
Total Bilirubin: 0.3 mg/dL (ref 0.3–1.2)
Total Protein: 7.2 g/dL (ref 6.0–8.3)

## 2011-04-30 LAB — CBC
HCT: 43.2 % (ref 39.0–52.0)
Hemoglobin: 15.3 g/dL (ref 13.0–17.0)
MCH: 31.1 pg (ref 26.0–34.0)
MCHC: 35.4 g/dL (ref 30.0–36.0)
MCV: 87.8 fL (ref 78.0–100.0)
Platelets: 213 10*3/uL (ref 150–400)
RBC: 4.92 MIL/uL (ref 4.22–5.81)
RDW: 12.7 % (ref 11.5–15.5)
WBC: 9.1 10*3/uL (ref 4.0–10.5)

## 2011-04-30 LAB — POCT I-STAT TROPONIN I: Troponin i, poc: 0.01 ng/mL (ref 0.00–0.08)

## 2011-05-13 ENCOUNTER — Encounter: Payer: Self-pay | Admitting: Internal Medicine

## 2011-05-13 ENCOUNTER — Ambulatory Visit (AMBULATORY_SURGERY_CENTER): Payer: BC Managed Care – PPO | Admitting: Internal Medicine

## 2011-05-13 VITALS — BP 123/66 | HR 64 | Temp 97.4°F | Resp 16 | Ht 65.0 in | Wt 195.0 lb

## 2011-05-13 DIAGNOSIS — D1330 Benign neoplasm of unspecified part of small intestine: Secondary | ICD-10-CM

## 2011-05-13 DIAGNOSIS — D126 Benign neoplasm of colon, unspecified: Secondary | ICD-10-CM

## 2011-05-13 DIAGNOSIS — Z1211 Encounter for screening for malignant neoplasm of colon: Secondary | ICD-10-CM

## 2011-05-13 HISTORY — PX: COLONOSCOPY: SHX174

## 2011-05-13 LAB — GLUCOSE, CAPILLARY
Glucose-Capillary: 225 mg/dL — ABNORMAL HIGH (ref 70–99)
Glucose-Capillary: 240 mg/dL — ABNORMAL HIGH (ref 70–99)

## 2011-05-13 MED ORDER — SODIUM CHLORIDE 0.9 % IV SOLN: 500.0000 mL | INTRAVENOUS | Status: DC

## 2011-05-13 NOTE — Patient Instructions (Addendum)
Nine (9) polyps were removed today. That is a large number of polyps but they were all small and appear benign. I will send you a letter about the results and timing of next routine colonoscopy to help prevent colorectal cancer. [name], MD, [facility]  Please follow discharge instructions handouts given today. Resume your current medications. Blood sugar was 240. Call us with any questions or concerns [phone]. We will call you Monday morning for follow up. Thank you!

## 2011-05-16 ENCOUNTER — Telehealth: Payer: Self-pay | Admitting: *Deleted

## 2011-05-16 NOTE — Telephone Encounter (Signed)
Spoke with sister,she said saw him yesterday and he was fine,but she will let him know we called.

## 2011-05-18 ENCOUNTER — Encounter: Payer: Self-pay | Admitting: Internal Medicine

## 2011-05-18 NOTE — Progress Notes (Signed)
Quick Note:  9 adenomas Repeat 05/2014 ______

## 2011-05-19 DIAGNOSIS — Z860100 Personal history of colon polyps, unspecified: Secondary | ICD-10-CM | POA: Insufficient documentation

## 2011-05-19 DIAGNOSIS — Z860101 Personal history of adenomatous and serrated colon polyps: Secondary | ICD-10-CM

## 2011-05-19 HISTORY — DX: Personal history of colonic polyps: Z86.010

## 2011-05-19 HISTORY — DX: Personal history of adenomatous and serrated colon polyps: Z86.0101

## 2011-07-10 ENCOUNTER — Emergency Department (HOSPITAL_COMMUNITY)
Admission: EM | Admit: 2011-07-10 | Discharge: 2011-07-10 | Disposition: A | Discharge status: Home / Self Care | Payer: BC Managed Care – PPO | Attending: Emergency Medicine | Admitting: Emergency Medicine

## 2011-07-10 ENCOUNTER — Encounter (HOSPITAL_COMMUNITY): Payer: Self-pay | Admitting: *Deleted

## 2011-07-10 DIAGNOSIS — R109 Unspecified abdominal pain: Secondary | ICD-10-CM | POA: Insufficient documentation

## 2011-07-10 DIAGNOSIS — R6883 Chills (without fever): Secondary | ICD-10-CM | POA: Insufficient documentation

## 2011-07-10 DIAGNOSIS — G4730 Sleep apnea, unspecified: Secondary | ICD-10-CM | POA: Insufficient documentation

## 2011-07-10 DIAGNOSIS — J111 Influenza due to unidentified influenza virus with other respiratory manifestations: Secondary | ICD-10-CM | POA: Insufficient documentation

## 2011-07-10 DIAGNOSIS — I10 Essential (primary) hypertension: Secondary | ICD-10-CM | POA: Insufficient documentation

## 2011-07-10 DIAGNOSIS — R197 Diarrhea, unspecified: Secondary | ICD-10-CM | POA: Insufficient documentation

## 2011-07-10 DIAGNOSIS — E785 Hyperlipidemia, unspecified: Secondary | ICD-10-CM | POA: Insufficient documentation

## 2011-07-10 DIAGNOSIS — E119 Type 2 diabetes mellitus without complications: Secondary | ICD-10-CM | POA: Insufficient documentation

## 2011-07-10 DIAGNOSIS — IMO0001 Reserved for inherently not codable concepts without codable children: Secondary | ICD-10-CM | POA: Insufficient documentation

## 2011-07-10 DIAGNOSIS — Z794 Long term (current) use of insulin: Secondary | ICD-10-CM | POA: Insufficient documentation

## 2011-07-10 LAB — URINALYSIS, ROUTINE W REFLEX MICROSCOPIC
Bilirubin Urine: NEGATIVE
Glucose, UA: >1000 mg/dL — AB
Hgb urine dipstick: NEGATIVE
Ketones, ur: NEGATIVE mg/dL
Leukocytes, UA: NEGATIVE
Nitrite: NEGATIVE
Protein, ur: NEGATIVE mg/dL
Specific Gravity, Urine: 1.035 — ABNORMAL HIGH (ref 1.005–1.030)
Urobilinogen, UA: 0.2 mg/dL (ref 0.0–1.0)
pH: 5 (ref 5.0–8.0)

## 2011-07-10 LAB — CBC
HCT: 43.1 % (ref 39.0–52.0)
Hemoglobin: 15.3 g/dL (ref 13.0–17.0)
MCH: 30.5 pg (ref 26.0–34.0)
MCHC: 35.5 g/dL (ref 30.0–36.0)
MCV: 86 fL (ref 78.0–100.0)
Platelets: 205 10*3/uL (ref 150–400)
RBC: 5.01 MIL/uL (ref 4.22–5.81)
RDW: 12.2 % (ref 11.5–15.5)
WBC: 7.5 10*3/uL (ref 4.0–10.5)

## 2011-07-10 LAB — BASIC METABOLIC PANEL
BUN: 11 mg/dL (ref 6–23)
CO2: 23 mEq/L (ref 19–32)
Calcium: 9.3 mg/dL (ref 8.4–10.5)
Chloride: 100 mEq/L (ref 96–112)
Creatinine, Ser: 1.08 mg/dL (ref 0.50–1.35)
GFR calc Af Amer: >90 mL/min (ref >90)
GFR calc non Af Amer: 78 mL/min — ABNORMAL LOW (ref >90)
Glucose, Bld: 314 mg/dL — ABNORMAL HIGH (ref 70–99)
Potassium: 4.1 mEq/L (ref 3.5–5.1)
Sodium: 134 mEq/L — ABNORMAL LOW (ref 135–145)

## 2011-07-10 LAB — URINE MICROSCOPIC-ADD ON

## 2011-07-10 LAB — DIFFERENTIAL
Basophils Absolute: 0 10*3/uL (ref 0.0–0.1)
Basophils Relative: 0 % (ref 0–1)
Eosinophils Absolute: 0.1 10*3/uL (ref 0.0–0.7)
Eosinophils Relative: 1 % (ref 0–5)
Lymphocytes Relative: 43 % (ref 12–46)
Lymphs Abs: 3.2 10*3/uL (ref 0.7–4.0)
Monocytes Absolute: 0.4 10*3/uL (ref 0.1–1.0)
Monocytes Relative: 5 % (ref 3–12)
Neutro Abs: 3.8 10*3/uL (ref 1.7–7.7)
Neutrophils Relative %: 52 % (ref 43–77)

## 2011-07-10 MED ORDER — SODIUM CHLORIDE 0.9 % IV BOLUS (SEPSIS)
500.0000 mL | Freq: Once | INTRAVENOUS | Status: AC
  Administered 2011-07-10: 500 mL via INTRAVENOUS

## 2011-07-10 NOTE — ED Notes (Signed)
Pt states he is still unable to void. Encouraged pt to try again. Pt states he would.

## 2011-07-10 NOTE — ED Notes (Signed)
Instructed pt to void as soon as possible. Informed pt it was the last test md was waiting on for dispostion. Pt stated he would try to void

## 2011-07-10 NOTE — ED Provider Notes (Signed)
History     CSN: [id number]  Arrival date & time 07/10/11  52   First MD Initiated Contact with Patient 07/10/11 1539      Chief Complaint  Patient presents with  . Abdominal Pain  . Generalized Body Aches    (Consider location/radiation/quality/duration/timing/severity/associated sxs/prior treatment) Patient is a 51 y.o. male presenting with abdominal pain.  Abdominal Pain The primary symptoms of the illness include abdominal pain.   Reports having diarrhea, chills, body aches x 4 days.  Patient has been urinating a lot.  Patient is a diabetic.  Patient has not taken his temperature.  Past Medical History  Diagnosis Date  . Diabetes mellitus   . Hypertension   . Arthritis   . Hyperlipidemia   . Sleep apnea     cpap    Past Surgical History  Procedure Date  . Forearm surgery   . Muscle biopsy   . Colonoscopy 05/13/11    9 adenomas    Family History  Problem Relation Age of Onset  . Heart disease Mother   . Heart disease Father   . Colon cancer Neg Hx   . Stomach cancer Neg Hx     History  Substance Use Topics  . Smoking status: Never Smoker   . Smokeless tobacco: Not on file  . Alcohol Use: No      Review of Systems  Gastrointestinal: Positive for abdominal pain.  All other systems reviewed and are negative.    Allergies  Review of patient's allergies indicates no known allergies.  Home Medications   Current Outpatient Rx  Name Route Sig Dispense Refill  . ASPIRIN 81 MG PO TABS Oral Take 81 mg by mouth daily.      . INSULIN ASPART 100 UNIT/ML [place] SOLN Subcutaneous Inject 20 Units into the skin 3 (three) times daily before meals.     . INSULIN GLARGINE 100 UNIT/ML [place] SOLN Subcutaneous Inject 50 Units into the skin at bedtime.      [name] LISINOPRIL 20 MG PO TABS Oral Take 20 mg by mouth daily.      [name] METFORMIN HCL 1000 MG PO TABS Oral Take 1,000 mg by mouth 2 (two) times daily with a meal.      . NAPROXEN 500 MG PO TABS Oral Take 500 mg by mouth 2  (two) times daily with a meal.        BP 129/77  Pulse 85  Temp(Src) 98 F (36.7 C) (Oral)  Resp 20  SpO2 98%  Physical Exam  Nursing note and vitals reviewed. Constitutional: He is oriented to person, place, and time. He appears well-developed and well-nourished. No distress.  HENT:  Head: Normocephalic and atraumatic.  Mouth/Throat: No oropharyngeal exudate.  Eyes: Pupils are equal, round, and reactive to light.  Neck: Normal range of motion.  Cardiovascular: Normal rate and intact distal pulses.   Pulmonary/Chest: No respiratory distress.  Abdominal: Soft. Normal appearance and bowel sounds are normal. He exhibits no distension. There is tenderness. There is rebound. There is no guarding.  Musculoskeletal: Normal range of motion.  Neurological: He is alert and oriented to person, place, and time. No cranial nerve deficit.  Skin: Skin is warm and dry. No rash noted.  Psychiatric: He has a normal mood and affect. His behavior is normal.    ED Course  Procedures (including critical care time) Patient given a liter of IV fluids prior to discharge.  Patient encouraged followup with his PMD and to keep close watch on  his blood sugars. Labs Reviewed  BASIC METABOLIC PANEL - Abnormal; Notable for the following:    Sodium 134 (*)    Glucose, Bld 314 (*)    GFR calc non Af Amer 78 (*)    All other components within normal limits  CBC  DIFFERENTIAL  URINALYSIS, ROUTINE W REFLEX MICROSCOPIC   No results found.   1. Influenza       MDM   Evaluation of his blood sugar at 314 reveals that this is generally where he lives.         Dot Lanes, MD 07/10/11 [phone]

## 2011-07-10 NOTE — ED Notes (Signed)
In and out cath performed via sterile technique by Jeral Fruit, nurse tech. UA to lab

## 2011-07-10 NOTE — ED Notes (Signed)
Encouraged pt to void for urine specimen

## 2011-07-10 NOTE — ED Notes (Signed)
Reports having n/v/d, chills, body aches x 4 days.

## 2011-11-11 DIAGNOSIS — J40 Bronchitis, not specified as acute or chronic: Secondary | ICD-10-CM | POA: Insufficient documentation

## 2011-11-11 DIAGNOSIS — Z79899 Other long term (current) drug therapy: Secondary | ICD-10-CM | POA: Insufficient documentation

## 2011-11-11 DIAGNOSIS — M129 Arthropathy, unspecified: Secondary | ICD-10-CM | POA: Insufficient documentation

## 2011-11-11 DIAGNOSIS — I10 Essential (primary) hypertension: Secondary | ICD-10-CM | POA: Insufficient documentation

## 2011-11-11 DIAGNOSIS — Z794 Long term (current) use of insulin: Secondary | ICD-10-CM | POA: Insufficient documentation

## 2011-11-11 DIAGNOSIS — E119 Type 2 diabetes mellitus without complications: Secondary | ICD-10-CM | POA: Insufficient documentation

## 2011-11-11 DIAGNOSIS — R51 Headache: Secondary | ICD-10-CM | POA: Insufficient documentation

## 2011-11-11 DIAGNOSIS — J3489 Other specified disorders of nose and nasal sinuses: Secondary | ICD-10-CM | POA: Insufficient documentation

## 2011-11-11 DIAGNOSIS — IMO0001 Reserved for inherently not codable concepts without codable children: Secondary | ICD-10-CM | POA: Insufficient documentation

## 2011-11-11 DIAGNOSIS — R059 Cough, unspecified: Secondary | ICD-10-CM | POA: Insufficient documentation

## 2011-11-11 DIAGNOSIS — R070 Pain in throat: Secondary | ICD-10-CM | POA: Insufficient documentation

## 2011-11-11 DIAGNOSIS — R05 Cough: Secondary | ICD-10-CM | POA: Insufficient documentation

## 2011-11-11 DIAGNOSIS — R509 Fever, unspecified: Secondary | ICD-10-CM | POA: Insufficient documentation

## 2011-11-11 DIAGNOSIS — E785 Hyperlipidemia, unspecified: Secondary | ICD-10-CM | POA: Insufficient documentation

## 2011-11-11 DIAGNOSIS — Z7982 Long term (current) use of aspirin: Secondary | ICD-10-CM | POA: Insufficient documentation

## 2011-11-12 ENCOUNTER — Encounter (HOSPITAL_COMMUNITY): Payer: Self-pay | Admitting: *Deleted

## 2011-11-12 ENCOUNTER — Emergency Department (HOSPITAL_COMMUNITY): Payer: BC Managed Care – PPO

## 2011-11-12 ENCOUNTER — Emergency Department (HOSPITAL_COMMUNITY)
Admission: EM | Admit: 2011-11-12 | Discharge: 2011-11-12 | Disposition: A | Discharge status: Home / Self Care | Payer: BC Managed Care – PPO | Attending: Emergency Medicine | Admitting: Emergency Medicine

## 2011-11-12 DIAGNOSIS — J40 Bronchitis, not specified as acute or chronic: Secondary | ICD-10-CM

## 2011-11-12 LAB — POCT I-STAT, CHEM 8
BUN: 19 mg/dL (ref 6–23)
Calcium, Ion: 1.24 mmol/L (ref 1.12–1.32)
Chloride: 105 mEq/L (ref 96–112)
Creatinine, Ser: 1.2 mg/dL (ref 0.50–1.35)
Glucose, Bld: 240 mg/dL — ABNORMAL HIGH (ref 70–99)
HCT: 46 % (ref 39.0–52.0)
Hemoglobin: 15.6 g/dL (ref 13.0–17.0)
Potassium: 4.2 mEq/L (ref 3.5–5.1)
Sodium: 139 mEq/L (ref 135–145)
TCO2: 25 mmol/L (ref 0–100)

## 2011-11-12 LAB — DIFFERENTIAL
Basophils Absolute: 0 10*3/uL (ref 0.0–0.1)
Basophils Relative: 0 % (ref 0–1)
Eosinophils Absolute: 0.1 10*3/uL (ref 0.0–0.7)
Eosinophils Relative: 1 % (ref 0–5)
Lymphocytes Relative: 22 % (ref 12–46)
Lymphs Abs: 2.6 10*3/uL (ref 0.7–4.0)
Monocytes Absolute: 0.8 10*3/uL (ref 0.1–1.0)
Monocytes Relative: 6 % (ref 3–12)
Neutro Abs: 8.7 10*3/uL — ABNORMAL HIGH (ref 1.7–7.7)
Neutrophils Relative %: 71 % (ref 43–77)

## 2011-11-12 LAB — URINALYSIS, ROUTINE W REFLEX MICROSCOPIC
Bilirubin Urine: NEGATIVE
Glucose, UA: >1000 mg/dL — AB
Hgb urine dipstick: NEGATIVE
Ketones, ur: NEGATIVE mg/dL
Nitrite: NEGATIVE
Protein, ur: NEGATIVE mg/dL
Specific Gravity, Urine: 1.033 — ABNORMAL HIGH (ref 1.005–1.030)
Urobilinogen, UA: 0.2 mg/dL (ref 0.0–1.0)
pH: 5 (ref 5.0–8.0)

## 2011-11-12 LAB — CBC
HCT: 43.6 % (ref 39.0–52.0)
Hemoglobin: 15.1 g/dL (ref 13.0–17.0)
MCH: 30.4 pg (ref 26.0–34.0)
MCHC: 34.6 g/dL (ref 30.0–36.0)
MCV: 87.7 fL (ref 78.0–100.0)
Platelets: 210 10*3/uL (ref 150–400)
RBC: 4.97 MIL/uL (ref 4.22–5.81)
RDW: 13.2 % (ref 11.5–15.5)
WBC: 12.2 10*3/uL — ABNORMAL HIGH (ref 4.0–10.5)

## 2011-11-12 LAB — URINE MICROSCOPIC-ADD ON

## 2011-11-12 LAB — POCT I-STAT TROPONIN I: Troponin i, poc: 0 ng/mL (ref 0.00–0.08)

## 2011-11-12 LAB — RAPID STREP SCREEN (MED CTR MEBANE ONLY): Streptococcus, Group A Screen (Direct): NEGATIVE

## 2011-11-12 MED ORDER — NITROGLYCERIN 0.4 MG SL SUBL: 0.4000 mg | SUBLINGUAL_TABLET | SUBLINGUAL | Status: DC | PRN

## 2011-11-12 MED ORDER — AEROCHAMBER PLUS W/MASK MISC
Status: AC
  Administered 2011-11-12: 07:00:00
  Filled 2011-11-12: qty 1

## 2011-11-12 MED ORDER — ASPIRIN 81 MG PO CHEW: 324.0000 mg | CHEWABLE_TABLET | Freq: Once | ORAL | Status: DC

## 2011-11-12 MED ORDER — AZITHROMYCIN 250 MG PO TABS: 250.0000 mg | ORAL_TABLET | Freq: Every day | ORAL | Status: AC

## 2011-11-12 MED ORDER — ALBUTEROL SULFATE HFA 108 (90 BASE) MCG/ACT IN AERS
2.0000 | INHALATION_SPRAY | Freq: Once | RESPIRATORY_TRACT | Status: AC
  Administered 2011-11-12: 2 via RESPIRATORY_TRACT
  Filled 2011-11-12: qty 6.7

## 2011-11-12 MED ORDER — HYDROCOD POLST-CHLORPHEN POLST 10-8 MG/5ML PO LQCR: 5.0000 mL | Freq: Two times a day (BID) | ORAL | Status: DC | PRN

## 2011-11-12 NOTE — ED Provider Notes (Signed)
Medical screening examination/treatment/procedure(s) were performed by non-physician practitioner and as supervising physician I was immediately available for consultation/collaboration.  Carlisle Beers, MD 11/12/11 [phone]

## 2011-11-12 NOTE — ED Notes (Signed)
C/o HA, nv, chills, L shoulder pain & sore throat. Onset 2 weeks ago ("have been putting off seeing MD"). Alert, NAD, calm, interactive, skin W&D, resps e/u, speaking in hoarse voice. LS CTA, radial pulses strong.

## 2011-11-12 NOTE — ED Provider Notes (Signed)
History     CSN: [id number]  Arrival date & time 11/11/11  2359   First MD Initiated Contact with Patient 11/12/11 0344      Chief Complaint  Patient presents with  . Emesis  . Headache  . Chills    (Consider location/radiation/quality/duration/timing/severity/associated sxs/prior treatment) HPI Comments: Patient here with a week history of headache, fever, chills, cough with green sputum production and sore throat, reports onset 2 weeks ago and he has not seen his PCP - denies chest pain or shortness of breath.  Reports has vomited related to the cough a couple of times.    Patient is a 51 y.o. male presenting with headaches and cough. The history is provided by the patient. No language interpreter was used.  Headache  Pertinent negatives include no shortness of breath.  Cough This is a new problem. The current episode started more than 1 week ago. The problem occurs constantly. The problem has not changed since onset.The cough is productive of sputum. The maximum temperature recorded prior to his arrival was 100 to 100.9 F. The fever has been present for 5 days or more. Associated symptoms include chills, headaches, rhinorrhea, sore throat and myalgias. Pertinent negatives include no chest pain, no sweats, no weight loss, no ear congestion, no ear pain, no shortness of breath, no wheezing and no eye redness. He has tried nothing for the symptoms. The treatment provided no relief. He is not a smoker. His past medical history does not include pneumonia or COPD.    Past Medical History  Diagnosis Date  . Diabetes mellitus   . Hypertension   . Arthritis   . Hyperlipidemia   . Sleep apnea     cpap    Past Surgical History  Procedure Date  . Forearm surgery   . Muscle biopsy   . Colonoscopy 05/13/11    9 adenomas    Family History  Problem Relation Age of Onset  . Heart disease Mother   . Heart disease Father   . Colon cancer Neg Hx   . Stomach cancer Neg Hx     History   Substance Use Topics  . Smoking status: Never Smoker   . Smokeless tobacco: Not on file  . Alcohol Use: No      Review of Systems  Constitutional: Positive for chills. Negative for weight loss.  HENT: Positive for sore throat and rhinorrhea. Negative for ear pain.   Eyes: Negative for redness.  Respiratory: Positive for cough. Negative for shortness of breath and wheezing.   Cardiovascular: Negative for chest pain.  Musculoskeletal: Positive for myalgias.  Neurological: Positive for headaches.  All other systems reviewed and are negative.    Allergies  Review of patient's allergies indicates no known allergies.  Home Medications   Current Outpatient Rx  Name Route Sig Dispense Refill  . ASPIRIN 81 MG PO TABS Oral Take 81 mg by mouth daily.      [name] EZETIMIBE 10 MG PO TABS Oral Take 10 mg by mouth daily.    . INSULIN ASPART 100 UNIT/ML [place] SOLN Subcutaneous Inject 45 Units into the skin 3 (three) times daily before meals.     . INSULIN GLARGINE 100 UNIT/ML [place] SOLN Subcutaneous Inject 55 Units into the skin at bedtime.     [name] LISINOPRIL 20 MG PO TABS Oral Take 20 mg by mouth daily.        BP 129/75  Pulse 85  Temp(Src) 98.4 F (36.9 C) (Oral)  Resp 18  SpO2 98%  Physical Exam  Nursing note and vitals reviewed. Constitutional: He is oriented to person, place, and time. He appears well-developed and well-nourished. No distress.  HENT:  Head: Normocephalic and atraumatic.  Right Ear: External ear normal.  Left Ear: External ear normal.  Nose: Nose normal.  Mouth/Throat: Oropharynx is clear and moist. No oropharyngeal exudate.       Pharyngeal erythema   Eyes: Conjunctivae are normal. Pupils are equal, round, and reactive to light. No scleral icterus.  Neck: Normal range of motion. Neck supple.  Cardiovascular: Normal rate, regular rhythm and normal heart sounds.  Exam reveals no gallop and no friction rub.   No murmur heard. Pulmonary/Chest: Effort normal and breath  sounds normal. No respiratory distress. He has no wheezes. He has no rales. He exhibits no tenderness.  Abdominal: Soft. Bowel sounds are normal. He exhibits no distension. There is no tenderness.  Musculoskeletal: Normal range of motion. He exhibits no edema and no tenderness.  Lymphadenopathy:    He has no cervical adenopathy.  Neurological: He is alert and oriented to person, place, and time. No cranial nerve deficit.  Skin: Skin is warm and dry. No rash noted. No erythema. No pallor.  Psychiatric: He has a normal mood and affect. His behavior is normal. Judgment and thought content normal.    ED Course  Procedures (including critical care time)  Labs Reviewed  CBC - Abnormal; Notable for the following:    WBC 12.2 (*)    All other components within normal limits  DIFFERENTIAL - Abnormal; Notable for the following:    Neutro Abs 8.7 (*)    All other components within normal limits  URINALYSIS, ROUTINE W REFLEX MICROSCOPIC - Abnormal; Notable for the following:    APPearance CLOUDY (*)    Specific Gravity, Urine 1.033 (*)    Glucose, UA >1000 (*)    Leukocytes, UA TRACE (*)    All other components within normal limits  POCT I-STAT, CHEM 8 - Abnormal; Notable for the following:    Glucose, Bld 240 (*)    All other components within normal limits  URINE MICROSCOPIC-ADD ON - Abnormal; Notable for the following:    Squamous Epithelial / LPF FEW (*)    All other components within normal limits  POCT I-STAT TROPONIN I  RAPID STREP SCREEN   Dg Chest 2 View  11/12/2011  *RADIOLOGY REPORT*  Clinical Data: Productive cough, fever, chills, vomiting.  CHEST - 2 VIEW  Comparison: 04/30/2011  Findings: The heart size and pulmonary vascularity are normal. The lungs appear clear and expanded without focal air space disease or consolidation. No blunting of the costophrenic angles.  No pneumothorax.  No significant change since previous study.  IMPRESSION: No evidence of active pulmonary disease.   Original Report Authenticated By: Neale Burly, M.D.    Results for orders placed during the hospital encounter of 11/12/11  CBC      Component Value Range   WBC 12.2 (*) 4.0 - 10.5 (K/uL)   RBC 4.97  4.22 - 5.81 (MIL/uL)   Hemoglobin 15.1  13.0 - 17.0 (g/dL)   HCT 43.6  39.0 - 52.0 (%)   MCV 87.7  78.0 - 100.0 (fL)   MCH 30.4  26.0 - 34.0 (pg)   MCHC 34.6  30.0 - 36.0 (g/dL)   RDW 13.2  11.5 - 15.5 (%)   Platelets 210  150 - 400 (K/uL)  DIFFERENTIAL      Component Value Range  Neutrophils Relative 71  43 - 77 (%)   Neutro Abs 8.7 (*) 1.7 - 7.7 (K/uL)   Lymphocytes Relative 22  12 - 46 (%)   Lymphs Abs 2.6  0.7 - 4.0 (K/uL)   Monocytes Relative 6  3 - 12 (%)   Monocytes Absolute 0.8  0.1 - 1.0 (K/uL)   Eosinophils Relative 1  0 - 5 (%)   Eosinophils Absolute 0.1  0.0 - 0.7 (K/uL)   Basophils Relative 0  0 - 1 (%)   Basophils Absolute 0.0  0.0 - 0.1 (K/uL)  URINALYSIS, ROUTINE W REFLEX MICROSCOPIC      Component Value Range   Color, Urine YELLOW  YELLOW    APPearance CLOUDY (*) CLEAR    Specific Gravity, Urine 1.033 (*) 1.005 - 1.030    pH 5.0  5.0 - 8.0    Glucose, UA >1000 (*) NEGATIVE (mg/dL)   Hgb urine dipstick NEGATIVE  NEGATIVE    Bilirubin Urine NEGATIVE  NEGATIVE    Ketones, ur NEGATIVE  NEGATIVE (mg/dL)   Protein, ur NEGATIVE  NEGATIVE (mg/dL)   Urobilinogen, UA 0.2  0.0 - 1.0 (mg/dL)   Nitrite NEGATIVE  NEGATIVE    Leukocytes, UA TRACE (*) NEGATIVE   POCT I-STAT, CHEM 8      Component Value Range   Sodium 139  135 - 145 (mEq/L)   Potassium 4.2  3.5 - 5.1 (mEq/L)   Chloride 105  96 - 112 (mEq/L)   BUN 19  6 - 23 (mg/dL)   Creatinine, Ser 1.20  0.50 - 1.35 (mg/dL)   Glucose, Bld 240 (*) 70 - 99 (mg/dL)   Calcium, Ion 1.24  1.12 - 1.32 (mmol/L)   TCO2 25  0 - 100 (mmol/L)   Hemoglobin 15.6  13.0 - 17.0 (g/dL)   HCT 46.0  39.0 - 52.0 (%)  POCT I-STAT TROPONIN I      Component Value Range   Troponin i, poc 0.00  0.00 - 0.08 (ng/mL)   Comment 3            RAPID STREP SCREEN      Component Value Range   Streptococcus, Group A Screen (Direct) NEGATIVE  NEGATIVE   URINE MICROSCOPIC-ADD ON      Component Value Range   Squamous Epithelial / LPF FEW (*) RARE    WBC, UA 0-2  <3 (WBC/hpf)   Urine-Other MUCOUS PRESENT     Dg Chest 2 View  11/12/2011  *RADIOLOGY REPORT*  Clinical Data: Productive cough, fever, chills, vomiting.  CHEST - 2 VIEW  Comparison: 04/30/2011  Findings: The heart size and pulmonary vascularity are normal. The lungs appear clear and expanded without focal air space disease or consolidation. No blunting of the costophrenic angles.  No pneumothorax.  No significant change since previous study.  IMPRESSION: No evidence of active pulmonary disease.  Original Report Authenticated By: Neale Burly, M.D.    Bronchitis     MDM  Patien there with cough, sore throat, headache and fever for the past week - mild leukocytosis noted without left shift, hyperglycemia without acidosis.  Plan to treat as bronchitis and give course of antibiotics as well as cough medication and inhaler.        Idalia Needle [place], [place] 11/12/11 [phone]

## 2011-11-12 NOTE — Discharge Instructions (Signed)

## 2011-12-20 ENCOUNTER — Emergency Department (HOSPITAL_COMMUNITY): Payer: BC Managed Care – PPO

## 2011-12-20 ENCOUNTER — Emergency Department (HOSPITAL_COMMUNITY)
Admission: EM | Admit: 2011-12-20 | Discharge: 2011-12-20 | Disposition: A | Discharge status: Home / Self Care | Payer: BC Managed Care – PPO | Attending: Emergency Medicine | Admitting: Emergency Medicine

## 2011-12-20 ENCOUNTER — Encounter (HOSPITAL_COMMUNITY): Payer: Self-pay | Admitting: Emergency Medicine

## 2011-12-20 DIAGNOSIS — Z794 Long term (current) use of insulin: Secondary | ICD-10-CM | POA: Insufficient documentation

## 2011-12-20 DIAGNOSIS — M79609 Pain in unspecified limb: Secondary | ICD-10-CM | POA: Insufficient documentation

## 2011-12-20 DIAGNOSIS — R0602 Shortness of breath: Secondary | ICD-10-CM | POA: Insufficient documentation

## 2011-12-20 DIAGNOSIS — M7989 Other specified soft tissue disorders: Secondary | ICD-10-CM | POA: Insufficient documentation

## 2011-12-20 DIAGNOSIS — R079 Chest pain, unspecified: Secondary | ICD-10-CM | POA: Insufficient documentation

## 2011-12-20 DIAGNOSIS — R071 Chest pain on breathing: Secondary | ICD-10-CM | POA: Insufficient documentation

## 2011-12-20 DIAGNOSIS — E119 Type 2 diabetes mellitus without complications: Secondary | ICD-10-CM | POA: Insufficient documentation

## 2011-12-20 DIAGNOSIS — R0789 Other chest pain: Secondary | ICD-10-CM

## 2011-12-20 DIAGNOSIS — I10 Essential (primary) hypertension: Secondary | ICD-10-CM | POA: Insufficient documentation

## 2011-12-20 DIAGNOSIS — K7689 Other specified diseases of liver: Secondary | ICD-10-CM | POA: Insufficient documentation

## 2011-12-20 DIAGNOSIS — R000 Tachycardia, unspecified: Secondary | ICD-10-CM | POA: Insufficient documentation

## 2011-12-20 DIAGNOSIS — I951 Orthostatic hypotension: Secondary | ICD-10-CM | POA: Insufficient documentation

## 2011-12-20 LAB — CARDIAC PANEL(CRET KIN+CKTOT+MB+TROPI)
CK, MB: 3.6 ng/mL (ref 0.3–4.0)
CK, MB: 2.8 ng/mL (ref 0.3–4.0)
Relative Index: 0.9 (ref 0.0–2.5)
Relative Index: 1 (ref 0.0–2.5)
Total CK: 398 U/L — ABNORMAL HIGH (ref 7–232)
Total CK: 294 U/L — ABNORMAL HIGH (ref 7–232)
Troponin I: <0.3 ng/mL (ref <0.30)
Troponin I: <0.3 ng/mL (ref <0.30)

## 2011-12-20 LAB — HEPATIC FUNCTION PANEL
ALT: 23 U/L (ref 0–53)
AST: 17 U/L (ref 0–37)
Albumin: 3.2 g/dL — ABNORMAL LOW (ref 3.5–5.2)
Alkaline Phosphatase: 83 U/L (ref 39–117)
Bilirubin, Direct: <0.1 mg/dL (ref 0.0–0.3)
Total Bilirubin: 0.5 mg/dL (ref 0.3–1.2)
Total Protein: 6.4 g/dL (ref 6.0–8.3)

## 2011-12-20 LAB — BASIC METABOLIC PANEL
BUN: 13 mg/dL (ref 6–23)
CO2: 23 mEq/L (ref 19–32)
Calcium: 9.6 mg/dL (ref 8.4–10.5)
Chloride: 95 mEq/L — ABNORMAL LOW (ref 96–112)
Creatinine, Ser: 1.21 mg/dL (ref 0.50–1.35)
GFR calc Af Amer: 79 mL/min — ABNORMAL LOW (ref >90)
GFR calc non Af Amer: 68 mL/min — ABNORMAL LOW (ref >90)
Glucose, Bld: 297 mg/dL — ABNORMAL HIGH (ref 70–99)
Potassium: 4.3 mEq/L (ref 3.5–5.1)
Sodium: 132 mEq/L — ABNORMAL LOW (ref 135–145)

## 2011-12-20 LAB — DIFFERENTIAL
Basophils Absolute: 0 10*3/uL (ref 0.0–0.1)
Basophils Relative: 0 % (ref 0–1)
Eosinophils Absolute: 0 10*3/uL (ref 0.0–0.7)
Eosinophils Relative: 0 % (ref 0–5)
Lymphocytes Relative: 12 % (ref 12–46)
Lymphs Abs: 1.3 10*3/uL (ref 0.7–4.0)
Monocytes Absolute: 0.4 10*3/uL (ref 0.1–1.0)
Monocytes Relative: 4 % (ref 3–12)
Neutro Abs: 8.6 10*3/uL — ABNORMAL HIGH (ref 1.7–7.7)
Neutrophils Relative %: 84 % — ABNORMAL HIGH (ref 43–77)

## 2011-12-20 LAB — LIPASE, BLOOD: Lipase: 25 U/L (ref 11–59)

## 2011-12-20 LAB — D-DIMER, QUANTITATIVE: D-Dimer, Quant: 0.25 ug/mL-FEU (ref 0.00–0.48)

## 2011-12-20 LAB — CBC
HCT: 45.6 % (ref 39.0–52.0)
Hemoglobin: 16.4 g/dL (ref 13.0–17.0)
MCH: 31.2 pg (ref 26.0–34.0)
MCHC: 36 g/dL (ref 30.0–36.0)
MCV: 86.9 fL (ref 78.0–100.0)
Platelets: 181 10*3/uL (ref 150–400)
RBC: 5.25 MIL/uL (ref 4.22–5.81)
RDW: 12.7 % (ref 11.5–15.5)
WBC: 10.3 10*3/uL (ref 4.0–10.5)

## 2011-12-20 MED ORDER — IOHEXOL 350 MG/ML SOLN
100.0000 mL | Freq: Once | INTRAVENOUS | Status: AC | PRN
Start: 2011-12-20 — End: 2011-12-20
  Administered 2011-12-20: 100 mL via INTRAVENOUS

## 2011-12-20 MED ORDER — SODIUM CHLORIDE 0.9 % IV BOLUS (SEPSIS)
1000.0000 mL | Freq: Once | INTRAVENOUS | Status: AC
  Administered 2011-12-20: 1000 mL via INTRAVENOUS

## 2011-12-20 MED ORDER — HYDROMORPHONE HCL PF 1 MG/ML IJ SOLN
1.0000 mg | Freq: Once | INTRAMUSCULAR | Status: AC
  Administered 2011-12-20: 1 mg via INTRAVENOUS
  Filled 2011-12-20: qty 1

## 2011-12-20 MED ORDER — KETOROLAC TROMETHAMINE 30 MG/ML IJ SOLN
30.0000 mg | Freq: Once | INTRAMUSCULAR | Status: AC
  Administered 2011-12-20: 30 mg via INTRAVENOUS
  Filled 2011-12-20: qty 1

## 2011-12-20 NOTE — ED Notes (Signed)
Pt presents to department for evaluation of diffuse chest pain radiating down R leg. Started last night while at home resting. States 10/10 pain at the time. Also states SOB and generalized weakness. Pain increases with movement and deep breathing. Skin warm and dry. Pt is conscious alert and oriented x4.

## 2011-12-20 NOTE — ED Notes (Signed)
Pt still unable to urinate at this time

## 2011-12-20 NOTE — ED Notes (Signed)
Pt was made aware that he may have to be cathed for urine per RN; pt understands and acknowledges

## 2011-12-20 NOTE — ED Notes (Signed)
Pt unable to urinate at this time; will check back later

## 2011-12-20 NOTE — ED Notes (Signed)
Patient transported to X-ray 

## 2011-12-20 NOTE — Discharge Instructions (Signed)
Chest Wall Pain Follow up with Dr. Brigitte Pulse regarding the abnormalities of your pancreas.  Return to the ED if you develop new or worsening symptoms. Chest wall pain is pain in or around the bones and muscles of your chest. It may take up to 6 weeks to get better. It may take longer if you must stay physically active in your work and activities.  CAUSES  Chest wall pain may happen on its own. However, it may be caused by:  A viral illness like the flu.   Injury.   Coughing.   Exercise.   Arthritis.   Fibromyalgia.   Shingles.  HOME CARE INSTRUCTIONS   Avoid overtiring physical activity. Try not to strain or perform activities that cause pain. This includes any activities using your chest or your abdominal and side muscles, especially if heavy weights are used.   Put ice on the sore area.   Put ice in a plastic bag.   Place a towel between your skin and the bag.   Leave the ice on for 15 to 20 minutes per hour while awake for the first 2 days.   Only take over-the-counter or prescription medicines for pain, discomfort, or fever as directed by your caregiver.  SEEK IMMEDIATE MEDICAL CARE IF:   Your pain increases, or you are very uncomfortable.   You have a fever.   Your chest pain becomes worse.   You have new, unexplained symptoms.   You have nausea or vomiting.   You feel sweaty or lightheaded.   You have a cough with phlegm (sputum), or you cough up blood.  MAKE SURE YOU:   Understand these instructions.   Will watch your condition.   Will get help right away if you are not doing well or get worse.  Document Released: 06/20/2005 Document Revised: 06/09/2011 Document Reviewed: 02/14/2011 [facility] Patient Information 2012 [place].  Orthostatic Hypotension Orthostatic hypotension is a sudden fall in blood pressure. It occurs when a person goes from a sitting or lying position to a standing position. CAUSES   Loss of body fluids (dehydration).    Medicines that lower blood pressure.   Sudden changes in posture, such as sudden standing when you have been sitting or lying down.   Taking too much of your medicine.  SYMPTOMS   Lightheadedness or dizziness.   Fainting or near-fainting.   A fast heart rate (tachycardia).   Weakness.   Feeling tired (fatigue).  DIAGNOSIS  Your caregiver may find the cause of orthostatic hypotension through:  A history and/or physical exam.   Checking your blood pressure. Your caregiver will check your blood pressure when you are:   Lying down.   Sitting.   Standing.   Tilt table testing. In this test, you are placed on a table that goes from a lying position to a standing position. You will be strapped to the table. This test helps to monitor your blood pressure and heart rate when you are in different positions.  TREATMENT   If orthostatic hypotension is caused by your medicines, your caregiver will need to adjust your dosage. Do not stop or adjust your medicine on your own.   When changing positions, make these changes slowly. This allows your body to adjust to the different position.   Compression stockings that are worn on your lower legs may be helpful.   Your caregiver may have you consume extra salt. Do not add extra salt to your diet unless directed by your caregiver.  Eat frequent, small meals. Avoid sudden standing after eating.   Avoid hot showers or excessive heat.   Your caregiver may give you fluids through the vein (intravenous).   Your caregiver may put you on medicine to help enhance fluid retention.  SEEK IMMEDIATE MEDICAL CARE IF:   You faint or have a near-fainting episode. Call your local emergency services (911 in [place]   You have or develop chest pain.   You feel sick to your stomach (nauseous) or vomit.   You have a loss of feeling or movement in your arms or legs.   You have difficulty talking, slurred speech, or you are unable to talk.   You  have difficulty thinking or have confused thinking.  MAKE SURE YOU:   Understand these instructions.   Will watch your condition.   Will get help right away if you are not doing well or get worse.  Document Released: 06/10/2002 Document Revised: 06/09/2011 Document Reviewed: 10/03/2008 [facility] Patient Information 2012 [place].

## 2011-12-20 NOTE — ED Notes (Signed)
Pt states some relief of pain with medication, now states 7/10 diffuse chest pain. He is resting quietly at the time. Remains on cardiac monitor. No signs of distress noted at the time.

## 2011-12-20 NOTE — ED Notes (Signed)
Pt. Stated, I just don't feel good at all I'm having chest pain and both arms are hurting.  I'm also SOB.  This all started last night.

## 2011-12-20 NOTE — ED Notes (Signed)
Pt transported to CT scan.

## 2011-12-20 NOTE — ED Provider Notes (Addendum)
History     CSN: [id number]  Arrival date & time 12/20/11  1000   First MD Initiated Contact with Patient 12/20/11 1044      Chief Complaint  Patient presents with  . Chest Pain    (Consider location/radiation/quality/duration/timing/severity/associated sxs/prior treatment) HPI Comments: Patient presents with anterior chest pain that spreads to the left and right of his chest has been constant for the past one day. Associated with shortness of breath pain radiates into both arms. His history diabetes, hypertension but no cardiac history. He reports having a stress test one year ago through Dr. Brigitte Pulse. He states is arousable to the ER he started having pain in his right thigh that feels crampy. He denies any weakness, numbness, tingling, fevers, bladder continence, back pain.  The history is provided by the patient.    Past Medical History  Diagnosis Date  . Diabetes mellitus   . Hypertension   . Arthritis   . Hyperlipidemia   . Sleep apnea     cpap    Past Surgical History  Procedure Date  . Forearm surgery   . Muscle biopsy   . Colonoscopy 05/13/11    9 adenomas    Family History  Problem Relation Age of Onset  . Heart disease Mother   . Heart disease Father   . Colon cancer Neg Hx   . Stomach cancer Neg Hx     History  Substance Use Topics  . Smoking status: Never Smoker   . Smokeless tobacco: Not on file  . Alcohol Use: No      Review of Systems  Constitutional: Negative for fever, activity change and appetite change.  HENT: Negative for congestion and rhinorrhea.   Respiratory: Positive for chest tightness and shortness of breath. Negative for cough.   Cardiovascular: Positive for chest pain and leg swelling.  Gastrointestinal: Negative for nausea, vomiting and abdominal pain.  Genitourinary: Negative for dysuria and hematuria.  Musculoskeletal: Positive for myalgias and arthralgias. Negative for back pain.    Allergies  Review of patient's allergies  indicates no known allergies.  Home Medications   Current Outpatient Rx  Name Route Sig Dispense Refill  . ASPIRIN EC 81 MG PO TBEC Oral Take 81 mg by mouth daily.    [name] EZETIMIBE 10 MG PO TABS Oral Take 10 mg by mouth daily.    . INSULIN ASPART 100 UNIT/ML [place] SOLN Subcutaneous Inject 45 Units into the skin 3 (three) times daily before meals.     . INSULIN GLARGINE 100 UNIT/ML [place] SOLN Subcutaneous Inject 50 Units into the skin 2 (two) times daily.     [name] LISINOPRIL 20 MG PO TABS Oral Take 20 mg by mouth daily.        BP 137/82  Pulse 106  Temp 98.9 F (37.2 C) (Oral)  Resp 16  Ht 6' 4"  (1.93 m)  Wt 225 lb (102.059 kg)  BMI 27.39 kg/m2  SpO2 99%  Physical Exam  Constitutional: He is oriented to person, place, and time. He appears well-developed and well-nourished. No distress.  HENT:  Head: Normocephalic and atraumatic.  Mouth/Throat: Oropharynx is clear and moist. No oropharyngeal exudate.  Eyes: Conjunctivae are normal. Pupils are equal, round, and reactive to light.  Neck: Normal range of motion. Neck supple.  Cardiovascular: Normal rate, regular rhythm and normal heart sounds.   No murmur heard.      Tachycardic  Pulmonary/Chest: Effort normal and breath sounds normal. No respiratory distress. He exhibits tenderness.  Diffuse reproducible chest wall pain  Abdominal: Soft. There is no tenderness. There is no rebound and no guarding.  Musculoskeletal: Normal range of motion. He exhibits no edema and no tenderness.       Tender to palpation right anterior and lateral thigh  Neurological: He is alert and oriented to person, place, and time. No cranial nerve deficit.  Skin: Skin is warm.    ED Course  Procedures (including critical care time)  Labs Reviewed  DIFFERENTIAL - Abnormal; Notable for the following:    Neutrophils Relative 84 (*)     Neutro Abs 8.6 (*)     All other components within normal limits  CBC  D-DIMER, QUANTITATIVE  BASIC METABOLIC PANEL    CARDIAC PANEL(CRET KIN+CKTOT+MB+TROPI)  URINALYSIS, ROUTINE W REFLEX MICROSCOPIC   Dg Chest 2 View  12/20/2011  *RADIOLOGY REPORT*  Clinical Data: 51 year old male with chest pain, shortness of breath.  CHEST - 2 VIEW  Comparison: 11/12/2011 and earlier.  Findings: Stable lung volumes.  Cardiac size and mediastinal contours are stable and within normal limits (right cardiophrenic angle epicardial fat described on chest CT 09/22/2001, no images available).  Visualized tracheal air column is within normal limits.  The lungs remain clear.  No pneumothorax or effusion. No acute osseous abnormality identified.  IMPRESSION: No acute cardiopulmonary abnormality.  Original Report Authenticated By: Randall An, M.D.     No diagnosis found.    MDM  Reproducible chest pain associated with shortness of breath and bilateral arm pain. No EKG changes.  Chest x-ray negative, d-dimer negative  Chest pain and leg pain much improved after analgesia. Orthostasis noted. CT negative for pulmonary embolism or aortic dissection. Chest pain is reproducible and atypical for ACS. He has no EKG changes ears negative cardiac enzymes. Patient's leg pain is resolved. There is no evidence of acute limb ischemia. His +2 pulses throughout and no motor deficits.   Date: 12/20/2011  Rate: 108  Rhythm: sinus tachycardia  QRS Axis: normal  Intervals: normal  ST/T Wave abnormalities: nonspecific ST/T changes  Conduction Disutrbances:none  Narrative Interpretation:   Old EKG Reviewed: unchanged  Patient informed of pancreas abnormalities.  States he will follow up with Dr. Brigitte Pulse.  Ezequiel Essex, MD 12/20/11 1517  Ezequiel Essex, MD 12/20/11 1524

## 2011-12-20 NOTE — ED Notes (Signed)
Pt.still unable to viod at this time.

## 2011-12-20 NOTE — ED Notes (Signed)
Pt aware of need of urine specimen; pt handed an urinal to see if he can provide an urine specimen at this time

## 2011-12-23 ENCOUNTER — Other Ambulatory Visit: Payer: Self-pay | Admitting: Internal Medicine

## 2011-12-23 DIAGNOSIS — E279 Disorder of adrenal gland, unspecified: Secondary | ICD-10-CM

## 2011-12-23 DIAGNOSIS — E278 Other specified disorders of adrenal gland: Secondary | ICD-10-CM

## 2011-12-26 ENCOUNTER — Ambulatory Visit
Admission: RE | Admit: 2011-12-26 | Discharge: 2011-12-26 | Disposition: A | Discharge status: Home / Self Care | Payer: BC Managed Care – PPO | Source: Ambulatory Visit | Attending: Internal Medicine | Admitting: Internal Medicine

## 2011-12-26 DIAGNOSIS — E278 Other specified disorders of adrenal gland: Secondary | ICD-10-CM

## 2011-12-26 MED ORDER — GADOBENATE DIMEGLUMINE 529 MG/ML IV SOLN
18.0000 mL | Freq: Once | INTRAVENOUS | Status: AC | PRN
  Administered 2011-12-26: 18 mL via INTRAVENOUS

## 2011-12-27 ENCOUNTER — Telehealth: Payer: Self-pay | Admitting: Internal Medicine

## 2011-12-27 NOTE — Telephone Encounter (Signed)
[facility] is closed for lunch, I will try and reach sharon after 2 pm

## 2011-12-27 NOTE — Telephone Encounter (Signed)
I have left a message for Calvin [name] to return my call.  I have also attempted to reach the patient

## 2011-12-28 NOTE — Telephone Encounter (Signed)
I have left another message for Calvin [name] to return my call

## 2011-12-29 NOTE — Telephone Encounter (Signed)
Patient will come in and see Tye Savoy RNP on Monday 01/02/12 @ 1:30

## 2012-01-02 ENCOUNTER — Ambulatory Visit: Payer: BC Managed Care – PPO | Admitting: Nurse Practitioner

## 2012-02-17 ENCOUNTER — Emergency Department (HOSPITAL_COMMUNITY): Payer: No Typology Code available for payment source

## 2012-02-17 ENCOUNTER — Emergency Department (HOSPITAL_COMMUNITY)
Admission: EM | Admit: 2012-02-17 | Discharge: 2012-02-17 | Disposition: A | Discharge status: Home / Self Care | Payer: No Typology Code available for payment source | Attending: Emergency Medicine | Admitting: Emergency Medicine

## 2012-02-17 ENCOUNTER — Encounter (HOSPITAL_COMMUNITY): Payer: Self-pay | Admitting: Emergency Medicine

## 2012-02-17 DIAGNOSIS — Y92410 Unspecified street and highway as the place of occurrence of the external cause: Secondary | ICD-10-CM | POA: Insufficient documentation

## 2012-02-17 DIAGNOSIS — M779 Enthesopathy, unspecified: Secondary | ICD-10-CM

## 2012-02-17 DIAGNOSIS — E785 Hyperlipidemia, unspecified: Secondary | ICD-10-CM | POA: Insufficient documentation

## 2012-02-17 DIAGNOSIS — Z794 Long term (current) use of insulin: Secondary | ICD-10-CM | POA: Insufficient documentation

## 2012-02-17 DIAGNOSIS — T148XXA Other injury of unspecified body region, initial encounter: Secondary | ICD-10-CM

## 2012-02-17 DIAGNOSIS — I10 Essential (primary) hypertension: Secondary | ICD-10-CM | POA: Insufficient documentation

## 2012-02-17 DIAGNOSIS — G4730 Sleep apnea, unspecified: Secondary | ICD-10-CM | POA: Insufficient documentation

## 2012-02-17 DIAGNOSIS — E119 Type 2 diabetes mellitus without complications: Secondary | ICD-10-CM | POA: Insufficient documentation

## 2012-02-17 DIAGNOSIS — M898X9 Other specified disorders of bone, unspecified site: Secondary | ICD-10-CM | POA: Insufficient documentation

## 2012-02-17 DIAGNOSIS — M549 Dorsalgia, unspecified: Secondary | ICD-10-CM | POA: Insufficient documentation

## 2012-02-17 MED ORDER — IBUPROFEN 800 MG PO TABS: 800.0000 mg | ORAL_TABLET | Freq: Three times a day (TID) | ORAL | Status: AC | PRN

## 2012-02-17 MED ORDER — CYCLOBENZAPRINE HCL 10 MG PO TABS: 10.0000 mg | ORAL_TABLET | Freq: Three times a day (TID) | ORAL | Status: AC | PRN
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
## 2012-02-17 NOTE — ED Provider Notes (Signed)
Medical screening examination/treatment/procedure(s) were performed by non-physician practitioner and as supervising physician I was immediately available for consultation/collaboration.  Orlie Dakin, MD 02/17/12 [phone]
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
## 2012-06-04 ENCOUNTER — Emergency Department (HOSPITAL_COMMUNITY): Payer: Self-pay

## 2012-06-04 ENCOUNTER — Emergency Department (HOSPITAL_COMMUNITY)
Admission: EM | Admit: 2012-06-04 | Discharge: 2012-06-04 | Disposition: A | Discharge status: Home / Self Care | Payer: Self-pay | Attending: Emergency Medicine | Admitting: Emergency Medicine

## 2012-06-04 ENCOUNTER — Encounter (HOSPITAL_COMMUNITY): Payer: Self-pay | Admitting: Emergency Medicine

## 2012-06-04 DIAGNOSIS — Z79899 Other long term (current) drug therapy: Secondary | ICD-10-CM | POA: Insufficient documentation

## 2012-06-04 DIAGNOSIS — R51 Headache: Secondary | ICD-10-CM | POA: Insufficient documentation

## 2012-06-04 DIAGNOSIS — E785 Hyperlipidemia, unspecified: Secondary | ICD-10-CM | POA: Insufficient documentation

## 2012-06-04 DIAGNOSIS — Z794 Long term (current) use of insulin: Secondary | ICD-10-CM | POA: Insufficient documentation

## 2012-06-04 DIAGNOSIS — R739 Hyperglycemia, unspecified: Secondary | ICD-10-CM

## 2012-06-04 DIAGNOSIS — E1169 Type 2 diabetes mellitus with other specified complication: Secondary | ICD-10-CM | POA: Insufficient documentation

## 2012-06-04 DIAGNOSIS — G4730 Sleep apnea, unspecified: Secondary | ICD-10-CM | POA: Insufficient documentation

## 2012-06-04 DIAGNOSIS — Z8739 Personal history of other diseases of the musculoskeletal system and connective tissue: Secondary | ICD-10-CM | POA: Insufficient documentation

## 2012-06-04 DIAGNOSIS — Z7982 Long term (current) use of aspirin: Secondary | ICD-10-CM | POA: Insufficient documentation

## 2012-06-04 DIAGNOSIS — I10 Essential (primary) hypertension: Secondary | ICD-10-CM | POA: Insufficient documentation

## 2012-06-04 DIAGNOSIS — R509 Fever, unspecified: Secondary | ICD-10-CM | POA: Insufficient documentation

## 2012-06-04 DIAGNOSIS — R197 Diarrhea, unspecified: Secondary | ICD-10-CM | POA: Insufficient documentation

## 2012-06-04 DIAGNOSIS — R112 Nausea with vomiting, unspecified: Secondary | ICD-10-CM | POA: Insufficient documentation

## 2012-06-04 LAB — CBC WITH DIFFERENTIAL/PLATELET
Basophils Absolute: 0 10*3/uL (ref 0.0–0.1)
Basophils Relative: 0 % (ref 0–1)
Eosinophils Absolute: 0 10*3/uL (ref 0.0–0.7)
Eosinophils Relative: 1 % (ref 0–5)
HCT: 44.9 % (ref 39.0–52.0)
Hemoglobin: 16 g/dL (ref 13.0–17.0)
Lymphocytes Relative: 38 % (ref 12–46)
Lymphs Abs: 1.8 10*3/uL (ref 0.7–4.0)
MCH: 30.8 pg (ref 26.0–34.0)
MCHC: 35.6 g/dL (ref 30.0–36.0)
MCV: 86.5 fL (ref 78.0–100.0)
Monocytes Absolute: 0.5 10*3/uL (ref 0.1–1.0)
Monocytes Relative: 10 % (ref 3–12)
Neutro Abs: 2.5 10*3/uL (ref 1.7–7.7)
Neutrophils Relative %: 51 % (ref 43–77)
Platelets: 168 10*3/uL (ref 150–400)
RBC: 5.19 MIL/uL (ref 4.22–5.81)
RDW: 12 % (ref 11.5–15.5)
WBC: 4.8 10*3/uL (ref 4.0–10.5)

## 2012-06-04 LAB — BASIC METABOLIC PANEL
BUN: 11 mg/dL (ref 6–23)
CO2: 25 mEq/L (ref 19–32)
Calcium: 9.2 mg/dL (ref 8.4–10.5)
Chloride: 96 mEq/L (ref 96–112)
Creatinine, Ser: 1.04 mg/dL (ref 0.50–1.35)
GFR calc Af Amer: >90 mL/min (ref >90)
GFR calc non Af Amer: 81 mL/min — ABNORMAL LOW (ref >90)
Glucose, Bld: 318 mg/dL — ABNORMAL HIGH (ref 70–99)
Potassium: 3.9 mEq/L (ref 3.5–5.1)
Sodium: 133 mEq/L — ABNORMAL LOW (ref 135–145)

## 2012-06-04 LAB — URINALYSIS, ROUTINE W REFLEX MICROSCOPIC
Bilirubin Urine: NEGATIVE
Glucose, UA: >1000 mg/dL — AB
Hgb urine dipstick: NEGATIVE
Ketones, ur: 15 mg/dL — AB
Leukocytes, UA: NEGATIVE
Nitrite: NEGATIVE
Protein, ur: NEGATIVE mg/dL
Specific Gravity, Urine: 1.04 — ABNORMAL HIGH (ref 1.005–1.030)
Urobilinogen, UA: 0.2 mg/dL (ref 0.0–1.0)
pH: 5 (ref 5.0–8.0)

## 2012-06-04 LAB — URINE MICROSCOPIC-ADD ON

## 2012-06-04 LAB — GLUCOSE, CAPILLARY
Glucose-Capillary: 296 mg/dL — ABNORMAL HIGH (ref 70–99)
Glucose-Capillary: 220 mg/dL — ABNORMAL HIGH (ref 70–99)

## 2012-06-04 MED ORDER — SODIUM CHLORIDE 0.9 % IV BOLUS (SEPSIS)
1000.0000 mL | Freq: Once | INTRAVENOUS | Status: AC
  Administered 2012-06-04: 1000 mL via INTRAVENOUS

## 2012-06-04 MED ORDER — SODIUM CHLORIDE 0.9 % IV BOLUS (SEPSIS): 1000.0000 mL | Freq: Once | INTRAVENOUS | Status: DC

## 2012-06-04 NOTE — ED Provider Notes (Signed)
History     CSN: [id number]  Arrival date & time 06/04/12  1054   First MD Initiated Contact with Patient 06/04/12 1138      Chief Complaint  Patient presents with  . Hyperglycemia  . Headache    (Consider location/radiation/quality/duration/timing/severity/associated sxs/prior treatment) HPI  Calvin [name] is a 51 y.o. male is an insulin-dependent diabetic complaining of headache and hyperglycemia for the last 3 days home sugar checks have been between 300- 400 patient has had several episodes of vomiting and diarrhea over the past 2 days he denies any chest pain or shortness of breath. He endorses subjective fever and dry cough, polydipsia and polyuria. Patient has positive sick contact: his daughter who was seen and evaluated in the pediatric ED earlier today for gastroenteritis.  Past Medical History  Diagnosis Date  . Diabetes mellitus   . Hypertension   . Arthritis   . Hyperlipidemia   . Sleep apnea     cpap    Past Surgical History  Procedure Date  . Forearm surgery   . Muscle biopsy   . Colonoscopy 05/13/11    9 adenomas    Family History  Problem Relation Age of Onset  . Heart disease Mother   . Heart disease Father   . Colon cancer Neg Hx   . Stomach cancer Neg Hx     History  Substance Use Topics  . Smoking status: Never Smoker   . Smokeless tobacco: Not on file  . Alcohol Use: No      Review of Systems  Constitutional: Positive for fever.  Respiratory: Negative for shortness of breath.   Cardiovascular: Negative for chest pain.  Gastrointestinal: Positive for nausea, vomiting and diarrhea. Negative for abdominal pain.  Genitourinary: Positive for frequency.  All other systems reviewed and are negative.    Allergies  Review of patient's allergies indicates no known allergies.  Home Medications   Current Outpatient Rx  Name  Route  Sig  Dispense  Refill  . ASPIRIN EC 81 MG PO TBEC   Oral   Take 81 mg by mouth daily.         .  ATORVASTATIN CALCIUM 40 MG PO TABS   Oral   Take 40 mg by mouth daily.         [name] EZETIMIBE 10 MG PO TABS   Oral   Take 10 mg by mouth daily.         . INSULIN ASPART 100 UNIT/ML [place] SOLN   Subcutaneous   Inject 45 Units into the skin 3 (three) times daily before meals.          . INSULIN GLARGINE 100 UNIT/ML [place] SOLN   Subcutaneous   Inject 50 Units into the skin 2 (two) times daily.          [name] LISINOPRIL 20 MG PO TABS   Oral   Take 20 mg by mouth daily.             BP 135/85  Pulse 96  Temp 97.7 F (36.5 C)  Resp 18  SpO2 100%  Physical Exam  Nursing note and vitals reviewed. Constitutional: He is oriented to person, place, and time. He appears well-developed and well-nourished. No distress.  HENT:  Head: Normocephalic and atraumatic.  Mouth/Throat: Oropharynx is clear and moist.  Eyes: Conjunctivae normal and EOM are normal. Pupils are equal, round, and reactive to light.  Neck: Normal range of motion.  Cardiovascular: Normal rate, regular rhythm and intact distal  pulses.   Pulmonary/Chest: Effort normal and breath sounds normal. No stridor. No respiratory distress. He has no wheezes. He has no rales. He exhibits no tenderness.  Abdominal: Soft. Bowel sounds are normal. He exhibits no distension and no mass. There is no tenderness. There is no rebound and no guarding.  Musculoskeletal: Normal range of motion.  Neurological: He is alert and oriented to person, place, and time.  Psychiatric: He has a normal mood and affect.    ED Course  Procedures (including critical care time)  Labs Reviewed  GLUCOSE, CAPILLARY - Abnormal; Notable for the following:    Glucose-Capillary 296 (*)     All other components within normal limits  BASIC METABOLIC PANEL - Abnormal; Notable for the following:    Sodium 133 (*)     Glucose, Bld 318 (*)     GFR calc non Af Amer 81 (*)     All other components within normal limits  URINALYSIS, ROUTINE W REFLEX MICROSCOPIC -  Abnormal; Notable for the following:    Specific Gravity, Urine 1.040 (*)     Glucose, UA >1000 (*)     Ketones, ur 15 (*)     All other components within normal limits  GLUCOSE, CAPILLARY - Abnormal; Notable for the following:    Glucose-Capillary 220 (*)     All other components within normal limits  URINE MICROSCOPIC-ADD ON - Abnormal; Notable for the following:    Squamous Epithelial / LPF FEW (*)     Casts HYALINE CASTS (*)     All other components within normal limits  CBC WITH DIFFERENTIAL   No results found.   1. Hyperglycemia without ketosis       MDM  Patient with hyperglycemia normal anion gap of 12.0.  Chest x-ray shows no abnormalities. Patient has no leukocytosis and his electrolytes are normal.  Pt's CBG reduced to 220 after hydration. He is tolerating PO and has outpatient follow up with primary care.    Pt verbalized understanding and agrees with care plan. Outpatient follow-up and return precautions given.           Monico Blitz, [name] 06/04/12 1559

## 2012-06-04 NOTE — ED Notes (Signed)
Pt is in peds w/ his  52 yo daughter will be seen after her

## 2012-06-04 NOTE — ED Notes (Signed)
Pt received lunch bag & is tolerating PO fluids & food well

## 2012-06-04 NOTE — ED Notes (Signed)
C/o HA X2d, also reports hypergylcemia X3d, CBG 400 pta, V/D X2-3d, denies CP/SOB, pt A/O and ambulatory, NAD

## 2012-06-05 NOTE — ED Provider Notes (Signed)
Medical screening examination/treatment/procedure(s) were performed by non-physician practitioner and as supervising physician I was immediately available for consultation/collaboration.   Julianne Rice, MD 06/05/12 [phone]

## 2012-09-28 ENCOUNTER — Emergency Department (HOSPITAL_COMMUNITY): Payer: Self-pay

## 2012-09-28 ENCOUNTER — Encounter (HOSPITAL_COMMUNITY): Payer: Self-pay

## 2012-09-28 ENCOUNTER — Emergency Department (HOSPITAL_COMMUNITY)
Admission: EM | Admit: 2012-09-28 | Discharge: 2012-09-28 | Disposition: A | Discharge status: Home / Self Care | Payer: Self-pay | Attending: Emergency Medicine | Admitting: Emergency Medicine

## 2012-09-28 DIAGNOSIS — R630 Anorexia: Secondary | ICD-10-CM | POA: Insufficient documentation

## 2012-09-28 DIAGNOSIS — R51 Headache: Secondary | ICD-10-CM | POA: Insufficient documentation

## 2012-09-28 DIAGNOSIS — Z79899 Other long term (current) drug therapy: Secondary | ICD-10-CM | POA: Insufficient documentation

## 2012-09-28 DIAGNOSIS — R079 Chest pain, unspecified: Secondary | ICD-10-CM | POA: Insufficient documentation

## 2012-09-28 DIAGNOSIS — I10 Essential (primary) hypertension: Secondary | ICD-10-CM | POA: Insufficient documentation

## 2012-09-28 DIAGNOSIS — Z794 Long term (current) use of insulin: Secondary | ICD-10-CM | POA: Insufficient documentation

## 2012-09-28 DIAGNOSIS — H538 Other visual disturbances: Secondary | ICD-10-CM | POA: Insufficient documentation

## 2012-09-28 DIAGNOSIS — G4730 Sleep apnea, unspecified: Secondary | ICD-10-CM | POA: Insufficient documentation

## 2012-09-28 DIAGNOSIS — M129 Arthropathy, unspecified: Secondary | ICD-10-CM | POA: Insufficient documentation

## 2012-09-28 DIAGNOSIS — E1169 Type 2 diabetes mellitus with other specified complication: Secondary | ICD-10-CM | POA: Insufficient documentation

## 2012-09-28 DIAGNOSIS — Z7982 Long term (current) use of aspirin: Secondary | ICD-10-CM | POA: Insufficient documentation

## 2012-09-28 DIAGNOSIS — R42 Dizziness and giddiness: Secondary | ICD-10-CM | POA: Insufficient documentation

## 2012-09-28 DIAGNOSIS — R739 Hyperglycemia, unspecified: Secondary | ICD-10-CM

## 2012-09-28 DIAGNOSIS — R631 Polydipsia: Secondary | ICD-10-CM | POA: Insufficient documentation

## 2012-09-28 DIAGNOSIS — E785 Hyperlipidemia, unspecified: Secondary | ICD-10-CM | POA: Insufficient documentation

## 2012-09-28 LAB — GLUCOSE, CAPILLARY
Glucose-Capillary: 342 mg/dL — ABNORMAL HIGH (ref 70–99)
Glucose-Capillary: 263 mg/dL — ABNORMAL HIGH (ref 70–99)

## 2012-09-28 LAB — BASIC METABOLIC PANEL
BUN: 9 mg/dL (ref 6–23)
CO2: 24 mEq/L (ref 19–32)
Calcium: 9.5 mg/dL (ref 8.4–10.5)
Chloride: 99 mEq/L (ref 96–112)
Creatinine, Ser: 1.08 mg/dL (ref 0.50–1.35)
GFR calc Af Amer: 89 mL/min — ABNORMAL LOW (ref >90)
GFR calc non Af Amer: 77 mL/min — ABNORMAL LOW (ref >90)
Glucose, Bld: 357 mg/dL — ABNORMAL HIGH (ref 70–99)
Potassium: 4.3 mEq/L (ref 3.5–5.1)
Sodium: 135 mEq/L (ref 135–145)

## 2012-09-28 LAB — CBC
HCT: 46.6 % (ref 39.0–52.0)
Hemoglobin: 16 g/dL (ref 13.0–17.0)
MCH: 29.7 pg (ref 26.0–34.0)
MCHC: 34.3 g/dL (ref 30.0–36.0)
MCV: 86.6 fL (ref 78.0–100.0)
Platelets: 220 10*3/uL (ref 150–400)
RBC: 5.38 MIL/uL (ref 4.22–5.81)
RDW: 12.5 % (ref 11.5–15.5)
WBC: 6 10*3/uL (ref 4.0–10.5)

## 2012-09-28 LAB — URINALYSIS, ROUTINE W REFLEX MICROSCOPIC
Bilirubin Urine: NEGATIVE
Glucose, UA: >1000 mg/dL — AB
Hgb urine dipstick: NEGATIVE
Ketones, ur: NEGATIVE mg/dL
Nitrite: NEGATIVE
Protein, ur: NEGATIVE mg/dL
Specific Gravity, Urine: 1.044 — ABNORMAL HIGH (ref 1.005–1.030)
Urobilinogen, UA: 0.2 mg/dL (ref 0.0–1.0)
pH: 5 (ref 5.0–8.0)

## 2012-09-28 LAB — POCT I-STAT TROPONIN I: Troponin i, poc: 0.01 ng/mL (ref 0.00–0.08)

## 2012-09-28 LAB — URINE MICROSCOPIC-ADD ON

## 2012-09-28 MED ORDER — INSULIN GLARGINE 100 UNIT/ML ~~LOC~~ SOLN
50.0000 [IU] | Freq: Two times a day (BID) | SUBCUTANEOUS | Status: DC
  Administered 2012-09-28: 50 [IU] via SUBCUTANEOUS
  Filled 2012-09-28 (×2): qty 0.5

## 2012-09-28 MED ORDER — LISINOPRIL 20 MG PO TABS
20.0000 mg | ORAL_TABLET | Freq: Every day | ORAL | Status: DC
Start: 2012-09-28 — End: 2012-09-28
  Administered 2012-09-28: 20 mg via ORAL
  Filled 2012-09-28: qty 1

## 2012-09-28 MED ORDER — EZETIMIBE 10 MG PO TABS
10.0000 mg | ORAL_TABLET | Freq: Every day | ORAL | Status: DC
  Administered 2012-09-28: 10 mg via ORAL
  Filled 2012-09-28: qty 1

## 2012-09-28 MED ORDER — SODIUM CHLORIDE 0.9 % IV BOLUS (SEPSIS)
2000.0000 mL | Freq: Once | INTRAVENOUS | Status: AC
  Administered 2012-09-28: 2000 mL via INTRAVENOUS

## 2012-09-28 MED ORDER — INSULIN ASPART 100 UNIT/ML ~~LOC~~ SOLN
45.0000 [IU] | Freq: Three times a day (TID) | SUBCUTANEOUS | Status: DC
  Administered 2012-09-28: 45 [IU] via SUBCUTANEOUS
  Filled 2012-09-28 (×2): qty 1

## 2012-09-28 MED ORDER — ASPIRIN EC 81 MG PO TBEC
81.0000 mg | DELAYED_RELEASE_TABLET | Freq: Every day | ORAL | Status: DC
  Administered 2012-09-28: 81 mg via ORAL
  Filled 2012-09-28: qty 1

## 2012-09-28 NOTE — ED Notes (Signed)
Called x 2 for triage.

## 2012-09-28 NOTE — ED Notes (Signed)
Called pt for triage; no answer. Registration sts pt is with daughter in [facility] ED

## 2012-09-28 NOTE — ED Provider Notes (Signed)
History     CSN: [id number]  Arrival date & time 09/28/12  1129   First MD Initiated Contact with Patient 09/28/12 1253      Chief Complaint  Patient presents with  . Hyperglycemia  . Chest Pain     HPI Pt c/o dizziness, weakness, blurred vision, headache, and elevated blood sugar starting this am. Pt also c/o (R) side chest pain and decrease appetite x1 week, reports increase thirst. Pt recently had URI and GI symptoms last week   Past Medical History  Diagnosis Date  . Diabetes mellitus   . Hypertension   . Arthritis   . Hyperlipidemia   . Sleep apnea     cpap    Past Surgical History  Procedure Laterality Date  . Forearm surgery    . Muscle biopsy    . Colonoscopy  05/13/11    9 adenomas    Family History  Problem Relation Age of Onset  . Heart disease Mother   . Heart disease Father   . Colon cancer Neg Hx   . Stomach cancer Neg Hx     History  Substance Use Topics  . Smoking status: Never Smoker   . Smokeless tobacco: Not on file  . Alcohol Use: No      Review of Systems  All other systems reviewed and are negative.    Allergies  Review of patient's allergies indicates no known allergies.  Home Medications   Current Outpatient Rx  Name  Route  Sig  Dispense  Refill  . aspirin EC 81 MG tablet   Oral   Take 81 mg by mouth daily.         [name] atorvastatin (LIPITOR) 40 MG tablet   Oral   Take 40 mg by mouth daily.         [name] ezetimibe (ZETIA) 10 MG tablet   Oral   Take 10 mg by mouth daily.         . insulin aspart (NOVOLOG) 100 UNIT/ML injection   Subcutaneous   Inject 45 Units into the skin 3 (three) times daily before meals.          . insulin glargine (LANTUS) 100 UNIT/ML injection   Subcutaneous   Inject 50 Units into the skin 2 (two) times daily.          [name] lisinopril (PRINIVIL,ZESTRIL) 20 MG tablet   Oral   Take 20 mg by mouth daily.             BP 125/65  Pulse 92  Temp(Src) 97.7 F (36.5 C) (Oral)  Resp 17   SpO2 98%  Physical Exam  Nursing note and vitals reviewed. Constitutional: He is oriented to person, place, and time. He appears well-developed and well-nourished. No distress.  HENT:  Head: Normocephalic and atraumatic.  Eyes: Pupils are equal, round, and reactive to light.  Neck: Normal range of motion.  Cardiovascular: Normal rate and intact distal pulses.   Pulmonary/Chest: No respiratory distress.  Abdominal: Normal appearance. He exhibits no distension. There is no tenderness.  Musculoskeletal: Normal range of motion.  Neurological: He is alert and oriented to person, place, and time. No cranial nerve deficit. Coordination normal.  Skin: Skin is warm and dry. No rash noted.  Psychiatric: He has a normal mood and affect. His behavior is normal.    ED Course  Procedures (including critical care time)  Date: 09/30/2012  Rate: 89  Rhythm: normal sinus rhythm  QRS Axis: normal  Intervals:  normal  ST/T Wave abnormalities: normal  Conduction Disutrbances: none  Narrative Interpretation: unremarkable      Labs Reviewed  BASIC METABOLIC PANEL - Abnormal; Notable for the following:    Glucose, Bld 357 (*)    GFR calc non Af Amer 77 (*)    GFR calc Af Amer 89 (*)    All other components within normal limits  GLUCOSE, CAPILLARY - Abnormal; Notable for the following:    Glucose-Capillary 342 (*)    All other components within normal limits  URINALYSIS, ROUTINE W REFLEX MICROSCOPIC - Abnormal; Notable for the following:    APPearance CLOUDY (*)    Specific Gravity, Urine 1.044 (*)    Glucose, UA >1000 (*)    Leukocytes, UA SMALL (*)    All other components within normal limits  GLUCOSE, CAPILLARY - Abnormal; Notable for the following:    Glucose-Capillary 263 (*)    All other components within normal limits  URINE CULTURE  CBC  URINE MICROSCOPIC-ADD ON  POCT I-STAT TROPONIN I   No results found.   1. Hyperglycemia       MDM          Dot Lanes,  MD 09/30/12 2137

## 2012-09-28 NOTE — ED Notes (Signed)
Pt c/o dizziness, weakness, blurred vision, headache, and elevated blood sugar starting this am. Pt also c/o (R) side chest pain and decrease appetite x1 week, reports increase thirst. Pt recently had URI and GI symptoms last week

## 2012-09-30 LAB — URINE CULTURE: Colony Count: 45000

## 2012-11-28 ENCOUNTER — Encounter (HOSPITAL_COMMUNITY): Payer: Self-pay

## 2012-11-28 ENCOUNTER — Emergency Department (HOSPITAL_COMMUNITY): Payer: Self-pay

## 2012-11-28 ENCOUNTER — Emergency Department (HOSPITAL_COMMUNITY)
Admission: EM | Admit: 2012-11-28 | Discharge: 2012-11-28 | Disposition: A | Discharge status: Home / Self Care | Payer: Self-pay | Attending: Emergency Medicine | Admitting: Emergency Medicine

## 2012-11-28 DIAGNOSIS — Z794 Long term (current) use of insulin: Secondary | ICD-10-CM | POA: Insufficient documentation

## 2012-11-28 DIAGNOSIS — X58XXXA Exposure to other specified factors, initial encounter: Secondary | ICD-10-CM | POA: Insufficient documentation

## 2012-11-28 DIAGNOSIS — Y939 Activity, unspecified: Secondary | ICD-10-CM | POA: Insufficient documentation

## 2012-11-28 DIAGNOSIS — Y929 Unspecified place or not applicable: Secondary | ICD-10-CM | POA: Insufficient documentation

## 2012-11-28 DIAGNOSIS — Z79899 Other long term (current) drug therapy: Secondary | ICD-10-CM | POA: Insufficient documentation

## 2012-11-28 DIAGNOSIS — R079 Chest pain, unspecified: Secondary | ICD-10-CM | POA: Insufficient documentation

## 2012-11-28 DIAGNOSIS — S139XXA Sprain of joints and ligaments of unspecified parts of neck, initial encounter: Secondary | ICD-10-CM | POA: Insufficient documentation

## 2012-11-28 DIAGNOSIS — I10 Essential (primary) hypertension: Secondary | ICD-10-CM | POA: Insufficient documentation

## 2012-11-28 DIAGNOSIS — Z8739 Personal history of other diseases of the musculoskeletal system and connective tissue: Secondary | ICD-10-CM | POA: Insufficient documentation

## 2012-11-28 DIAGNOSIS — R739 Hyperglycemia, unspecified: Secondary | ICD-10-CM

## 2012-11-28 DIAGNOSIS — S161XXA Strain of muscle, fascia and tendon at neck level, initial encounter: Secondary | ICD-10-CM

## 2012-11-28 DIAGNOSIS — E1169 Type 2 diabetes mellitus with other specified complication: Secondary | ICD-10-CM | POA: Insufficient documentation

## 2012-11-28 DIAGNOSIS — Z7982 Long term (current) use of aspirin: Secondary | ICD-10-CM | POA: Insufficient documentation

## 2012-11-28 LAB — COMPREHENSIVE METABOLIC PANEL
ALT: 19 U/L (ref 0–53)
AST: 17 U/L (ref 0–37)
Albumin: 3.9 g/dL (ref 3.5–5.2)
Alkaline Phosphatase: 100 U/L (ref 39–117)
BUN: 11 mg/dL (ref 6–23)
CO2: 27 mEq/L (ref 19–32)
Calcium: 9.8 mg/dL (ref 8.4–10.5)
Chloride: 95 mEq/L — ABNORMAL LOW (ref 96–112)
Creatinine, Ser: 1.15 mg/dL (ref 0.50–1.35)
GFR calc Af Amer: 83 mL/min — ABNORMAL LOW (ref >90)
GFR calc non Af Amer: 72 mL/min — ABNORMAL LOW (ref >90)
Glucose, Bld: 319 mg/dL — ABNORMAL HIGH (ref 70–99)
Potassium: 3.9 mEq/L (ref 3.5–5.1)
Sodium: 134 mEq/L — ABNORMAL LOW (ref 135–145)
Total Bilirubin: 0.5 mg/dL (ref 0.3–1.2)
Total Protein: 7.3 g/dL (ref 6.0–8.3)

## 2012-11-28 LAB — URINE MICROSCOPIC-ADD ON

## 2012-11-28 LAB — CBC WITH DIFFERENTIAL/PLATELET
Basophils Absolute: 0 10*3/uL (ref 0.0–0.1)
Basophils Relative: 0 % (ref 0–1)
Eosinophils Absolute: 0.1 10*3/uL (ref 0.0–0.7)
Eosinophils Relative: 1 % (ref 0–5)
HCT: 48.1 % (ref 39.0–52.0)
Hemoglobin: 16.6 g/dL (ref 13.0–17.0)
Lymphocytes Relative: 38 % (ref 12–46)
Lymphs Abs: 2.9 10*3/uL (ref 0.7–4.0)
MCH: 30.3 pg (ref 26.0–34.0)
MCHC: 34.5 g/dL (ref 30.0–36.0)
MCV: 87.8 fL (ref 78.0–100.0)
Monocytes Absolute: 0.4 10*3/uL (ref 0.1–1.0)
Monocytes Relative: 5 % (ref 3–12)
Neutro Abs: 4.3 10*3/uL (ref 1.7–7.7)
Neutrophils Relative %: 56 % (ref 43–77)
Platelets: 226 10*3/uL (ref 150–400)
RBC: 5.48 MIL/uL (ref 4.22–5.81)
RDW: 12.2 % (ref 11.5–15.5)
WBC: 7.7 10*3/uL (ref 4.0–10.5)

## 2012-11-28 LAB — URINALYSIS, ROUTINE W REFLEX MICROSCOPIC
Bilirubin Urine: NEGATIVE
Glucose, UA: >1000 mg/dL — AB
Hgb urine dipstick: NEGATIVE
Ketones, ur: NEGATIVE mg/dL
Leukocytes, UA: NEGATIVE
Nitrite: NEGATIVE
Protein, ur: NEGATIVE mg/dL
Specific Gravity, Urine: 1.02 (ref 1.005–1.030)
Urobilinogen, UA: 0.2 mg/dL (ref 0.0–1.0)
pH: 5.5 (ref 5.0–8.0)

## 2012-11-28 LAB — GLUCOSE, CAPILLARY
Glucose-Capillary: 345 mg/dL — ABNORMAL HIGH (ref 70–99)
Glucose-Capillary: 299 mg/dL — ABNORMAL HIGH (ref 70–99)

## 2012-11-28 LAB — POCT I-STAT TROPONIN I: Troponin i, poc: 0 ng/mL (ref 0.00–0.08)

## 2012-11-28 MED ORDER — ONDANSETRON HCL 4 MG/2ML IJ SOLN
4.0000 mg | Freq: Once | INTRAMUSCULAR | Status: AC
  Administered 2012-11-28: 4 mg via INTRAVENOUS
  Filled 2012-11-28 (×2): qty 2

## 2012-11-28 MED ORDER — SODIUM CHLORIDE 0.9 % IV BOLUS (SEPSIS)
1000.0000 mL | Freq: Once | INTRAVENOUS | Status: AC
  Administered 2012-11-28: 1000 mL via INTRAVENOUS

## 2012-11-28 MED ORDER — HYDROMORPHONE HCL PF 1 MG/ML IJ SOLN
1.0000 mg | Freq: Once | INTRAMUSCULAR | Status: AC
  Administered 2012-11-28: 1 mg via INTRAVENOUS
  Filled 2012-11-28: qty 1

## 2012-11-28 MED ORDER — INSULIN ASPART 100 UNIT/ML ~~LOC~~ SOLN
5.0000 [IU] | Freq: Once | SUBCUTANEOUS | Status: AC
  Administered 2012-11-28: 5 [IU] via SUBCUTANEOUS
  Filled 2012-11-28: qty 1

## 2012-11-28 MED ORDER — KETOROLAC TROMETHAMINE 30 MG/ML IJ SOLN
15.0000 mg | Freq: Once | INTRAMUSCULAR | Status: AC
  Administered 2012-11-28: 15 mg via INTRAVENOUS
  Filled 2012-11-28: qty 1

## 2012-11-28 NOTE — ED Provider Notes (Signed)
Complains of left-sided chest pain and left arm pain lightheadedness onset yesterday. No shortness of breath no abdominal pain no other complaint on exam alert nontoxic lungs clear auscultation heart regular rate and rhythm no murmurs chest pain is easily reproducible by forcible abduction of left shoulder.  Orlie Dakin, MD 11/29/12 [phone]

## 2012-11-28 NOTE — ED Notes (Signed)
The patient is AOx4 and comfortable with the discharge instructions.

## 2012-11-28 NOTE — Discharge Instructions (Signed)
Chest Pain (Nonspecific) It is often hard to give a specific diagnosis for the cause of chest pain. There is always a chance that your pain could be related to something serious, such as a heart attack or a blood clot in the lungs. You need to follow up with your caregiver for further evaluation. CAUSES   Heartburn.  Pneumonia or bronchitis.  Anxiety or stress.  Inflammation around your heart (pericarditis) or lung (pleuritis or pleurisy).  A blood clot in the lung.  A collapsed lung (pneumothorax). It can develop suddenly on its own (spontaneous pneumothorax) or from injury (trauma) to the chest.  Shingles infection (herpes zoster virus). The chest wall is composed of bones, muscles, and cartilage. Any of these can be the source of the pain.  The bones can be bruised by injury.  The muscles or cartilage can be strained by coughing or overwork.  The cartilage can be affected by inflammation and become sore (costochondritis). DIAGNOSIS  Lab tests or other studies, such as X-rays, electrocardiography, stress testing, or cardiac imaging, may be needed to find the cause of your pain.  TREATMENT   Treatment depends on what may be causing your chest pain. Treatment may include:  Acid blockers for heartburn.  Anti-inflammatory medicine.  Pain medicine for inflammatory conditions.  Antibiotics if an infection is present.  You may be advised to change lifestyle habits. This includes stopping smoking and avoiding alcohol, caffeine, and chocolate.  You may be advised to keep your head raised (elevated) when sleeping. This reduces the chance of acid going backward from your stomach into your esophagus.  Most of the time, nonspecific chest pain will improve within 2 to 3 days with rest and mild pain medicine. HOME CARE INSTRUCTIONS   If antibiotics were prescribed, take your antibiotics as directed. Finish them even if you start to feel better.  For the next few days, avoid physical  activities that bring on chest pain. Continue physical activities as directed.  Do not smoke.  Avoid drinking alcohol.  Only take over-the-counter or prescription medicine for pain, discomfort, or fever as directed by your caregiver.  Follow your caregiver's suggestions for further testing if your chest pain does not go away.  Keep any follow-up appointments you made. If you do not go to an appointment, you could develop lasting (chronic) problems with pain. If there is any problem keeping an appointment, you must call to reschedule. SEEK MEDICAL CARE IF:   You think you are having problems from the medicine you are taking. Read your medicine instructions carefully.  Your chest pain does not go away, even after treatment.  You develop a rash with blisters on your chest. SEEK IMMEDIATE MEDICAL CARE IF:   You have increased chest pain or pain that spreads to your arm, neck, jaw, back, or abdomen.  You develop shortness of breath, an increasing cough, or you are coughing up blood.  You have severe back or abdominal pain, feel nauseous, or vomit.  You develop severe weakness, fainting, or chills.  You have a fever. THIS IS AN EMERGENCY. Do not wait to see if the pain will go away. Get medical help at once. Call your local emergency services (911 in [place] Do not drive yourself to the hospital. MAKE SURE YOU:   Understand these instructions.  Will watch your condition.  Will get help right away if you are not doing well or get worse. Document Released: 03/30/2005 Document Revised: 09/12/2011 Document Reviewed: 01/24/2008 [facility] Patient Information 2014 [employer]  Cervical Sprain A cervical sprain is when the ligaments in the neck stretch or tear. The ligaments are the tissues that hold the neck bones in place. HOME CARE   Put ice on the injured area.  Put ice in a plastic bag.  Place a towel between your skin and the bag.  Leave the ice on for 15-20 minutes, 3-4  times a day.  Only take medicine as told by your doctor.  Keep all doctor visits as told.  Keep all physical therapy visits as told.  If your doctor gives you a neck collar, wear it as told.  Do not drive while wearing a neck collar.  Adjust your work station so that you have good posture while you work.  Avoid positions and activities that make your problems worse.  Warm up and stretch before being active. GET HELP RIGHT AWAY IF:   You are bleeding or your stomach is upset.  You have an allergic reaction to your medicine.  Your problems (symptoms) get worse.  You develop new problems.  You lose feeling (numbness) or you cannot move (paralysis) any part of your body.  You have tingling or weakness in any part of your body.  Your pain is not controlled with medicine.  You cannot take less pain medicine over time as planned.  Your activity level does not improve as expected. MAKE SURE YOU:   Understand these instructions.  Will watch your condition.  Will get help right away if you are not doing well or get worse. Document Released: 12/07/2007 Document Revised: 09/12/2011 Document Reviewed: 03/24/2011 [facility] Patient Information 2014 [place], [place].

## 2012-11-28 NOTE — ED Notes (Signed)
CBG 345 in triage

## 2012-11-28 NOTE — ED Notes (Signed)
Pt reports constant elevated blood sugars in spite of taking his diabetes medication. Pt also c/o dizziness, headache, Left arm pain, Left side chest pain, and increase urination x3 days

## 2012-11-28 NOTE — ED Provider Notes (Signed)
History     CSN: [id number]  Arrival date & time 11/28/12  1818   First MD Initiated Contact with Patient 11/28/12 1915      Chief Complaint  Patient presents with  . Hyperglycemia    HPI 52 year old male history of diabetes and hypertension presents complaining of chest pain and he is also noted to be hyperglycemic for the 350s.  His chest pain started yesterday. It is located to the lateral aspect of his chest on the left side with radiation to the left trapezius, down his left arm, down his left torso, down his left leg. Pain is described as a sharp pain. It is constant and has been present constantly without waxing or waning since yesterday morning. States he never had similar pain before. His pain is worsened by movements of the shoulder, turning and bending his neck. He's had no shortness of breath, nausea, vomiting, diaphoresis, dizziness, abdominal pain, leg swelling, leg pain or any other significant symptoms.  To person his diabetes is always very poorly controlled. He takes insulin and states that he is compliant with his insulin. He states that 350 is good for him. States that usually his blood glucoses in the 500s. He doesn't force polydipsia and polyuria, but states that this is usual for him no worse than normal.  He has been followed by Doctor Brigitte Pulse. However he states that he is unable to get in to see his primary care physician currently because he doesn't have any insurance.  He has no history of coronary artery disease, congestive heart failure, or other heart disease. He also has a history of PE or DVT. He has not had any prolonged immobilization, recent trauma, recent surgery, recent hospitalization. No hemoptysis, no cough, no dyspnea.   Past Medical History  Diagnosis Date  . Diabetes mellitus   . Hypertension   . Arthritis   . Hyperlipidemia   . Sleep apnea     cpap    Past Surgical History  Procedure Laterality Date  . Forearm surgery    . Muscle biopsy     . Colonoscopy  05/13/11    9 adenomas    Family History  Problem Relation Age of Onset  . Heart disease Mother   . Heart disease Father   . Colon cancer Neg Hx   . Stomach cancer Neg Hx     History  Substance Use Topics  . Smoking status: Never Smoker   . Smokeless tobacco: Not on file  . Alcohol Use: No      Review of Systems  Constitutional: Negative for fever and chills.  HENT: Negative for congestion, rhinorrhea, neck pain and neck stiffness.   Eyes: Negative for visual disturbance.  Respiratory: Negative for cough and shortness of breath.   Cardiovascular: Positive for chest pain. Negative for leg swelling.  Gastrointestinal: Negative for nausea, vomiting, abdominal pain and diarrhea.  Endocrine: Positive for polydipsia and polyuria.  Genitourinary: Negative for dysuria, urgency, frequency, flank pain and difficulty urinating.  Musculoskeletal: Negative for back pain.  Skin: Negative for rash.  Neurological: Positive for headaches. Negative for syncope, weakness and numbness.  All other systems reviewed and are negative.    Allergies  Review of patient's allergies indicates no known allergies.  Home Medications   Current Outpatient Rx  Name  Route  Sig  Dispense  Refill  . aspirin EC 81 MG tablet   Oral   Take 81 mg by mouth daily.         [name]  atorvastatin (LIPITOR) 40 MG tablet   Oral   Take 40 mg by mouth daily.         . insulin aspart (NOVOLOG) 100 UNIT/ML injection   Subcutaneous   Inject 45 Units into the skin 3 (three) times daily before meals.          . insulin glargine (LANTUS) 100 UNIT/ML injection   Subcutaneous   Inject 50 Units into the skin 2 (two) times daily.          [name] lisinopril (PRINIVIL,ZESTRIL) 20 MG tablet   Oral   Take 20 mg by mouth daily.             BP 141/86  Pulse 75  Temp(Src) 98.1 F (36.7 C) (Oral)  Resp 16  SpO2 100%  Physical Exam  Nursing note and vitals reviewed. Constitutional: He is oriented  to person, place, and time. He appears well-developed and well-nourished. No distress.  HENT:  Head: Normocephalic and atraumatic.  Mouth/Throat: Oropharynx is clear and moist.  Eyes: Conjunctivae and EOM are normal. Pupils are equal, round, and reactive to light. No scleral icterus.  Neck: Normal range of motion. Neck supple. No JVD present.  Cardiovascular: Normal rate, regular rhythm, normal heart sounds and intact distal pulses.  Exam reveals no gallop and no friction rub.   No murmur heard. Pulmonary/Chest: Effort normal and breath sounds normal. No respiratory distress. He has no wheezes. He has no rales.  Pain is reproducible with palpation over the lateral aspect of the chest, left side. Also reproducible in the left trapezius and left shoulder. He is reproducible with range of motion at the left shoulder particularly with overhead and abduction movements.  Abdominal: Soft. Bowel sounds are normal. He exhibits no distension. There is no tenderness. There is no rebound and no guarding.  Musculoskeletal: He exhibits no edema.  Neurological: He is alert and oriented to person, place, and time. No cranial nerve deficit. He exhibits normal muscle tone. Coordination normal.  Skin: Skin is warm and dry. He is not diaphoretic.    ED Course  Procedures (including critical care time)  Labs Reviewed  GLUCOSE, CAPILLARY - Abnormal; Notable for the following:    Glucose-Capillary 345 (*)    All other components within normal limits  COMPREHENSIVE METABOLIC PANEL - Abnormal; Notable for the following:    Sodium 134 (*)    Chloride 95 (*)    Glucose, Bld 319 (*)    GFR calc non Af Amer 72 (*)    GFR calc Af Amer 83 (*)    All other components within normal limits  URINALYSIS, ROUTINE W REFLEX MICROSCOPIC - Abnormal; Notable for the following:    Glucose, UA >1000 (*)    All other components within normal limits  URINE MICROSCOPIC-ADD ON - Abnormal; Notable for the following:    Squamous  Epithelial / LPF FEW (*)    All other components within normal limits  GLUCOSE, CAPILLARY - Abnormal; Notable for the following:    Glucose-Capillary 299 (*)    All other components within normal limits  CBC WITH DIFFERENTIAL  POCT I-STAT TROPONIN I   Dg Chest 2 View  11/28/2012   *RADIOLOGY REPORT*  Clinical Data: Hyperglycemia  CHEST - 2 VIEW  Comparison: Chest radiograph 09/28/2012  Findings: Normal mediastinum and heart silhouette.  Costophrenic angles are clear.  No effusion, infiltrate,  or pneumothorax.  IMPRESSION: No acute cardiopulmonary process.   Original Report Authenticated By: Suzy Bouchard, M.D.  Date: 11/29/2012  Rate: 87  Rhythm: normal sinus rhythm  QRS Axis: normal  Intervals: normal  ST/T Wave abnormalities: nonspecific T wave changes  Conduction Disutrbances:none  Narrative Interpretation:   Old EKG Reviewed: unchanged    1. Hyperglycemia   2. Chest pain   3. Neck strain, initial encounter       MDM  52 year old male with a history of diabetes and hypertension presents complaining of atypical chest pain. Onset yesterday. Pain has been absolutely constant since yesterday morning. The pain is worse with turning and bending his neck and with movements at his left shoulder. He also has pain radiating down his left arm, left torso, and left leg.  He also has hyperglycemia with glucose in the 350s.  His blood pressure is 141/86, vitals otherwise within normal limits. He is well appearing, in no acute distress. He has pain in his left trapezius, left shoulder, and left lateral chest reproducible with palpation. Cardiopulmonary exam is unremarkable. He has 2+ bilateral radial pulses and 2+ bilateral DP pulses. No peripheral edema.  EKG as documented above there is trace no acute ischemic changes.  Chest x-ray injuries no acute cardiopulmonary process. His complete metabolic panel, CBC, and urine studies are remarkable only for hyperglycemia as described,  sodium 134, large glucose in his urine but no ketones. He is not acidotic.  Although his chest pain is highly atypical and I do not feel like he needs further cardiac workup, the triage nurse did order a troponin per protocol and it is negative. His hyperglycemia is treated with 1 L IV fluid bolus and 5 units of insulin with improvement to the 200s. His chest pain is musculoskeletal in nature and possibly related to cervical radiculopathy as well given the pain radiating in his left arm and left leg as well as his left torso. Do not suspect aortic dissection or pulmonary embolus or other emergent cause. He is discharged home with instructions to followup with his primary care physician. He is given return precautions.        Wendall Papa, MD 11/29/12 [id number]  Wendall Papa, MD 11/29/12 9937

## 2012-11-29 NOTE — ED Provider Notes (Signed)
I have personally seen and examined the patient.  I have discussed the plan of care with the resident.  I have reviewed the documentation on PMH/FH/Soc. History.  I have reviewed the documentation of the resident and agree.  Orlie Dakin, MD 11/29/12 0110

## 2012-12-21 ENCOUNTER — Encounter (HOSPITAL_COMMUNITY): Payer: Self-pay | Admitting: Family Medicine

## 2012-12-21 ENCOUNTER — Emergency Department (HOSPITAL_COMMUNITY)
Admission: EM | Admit: 2012-12-21 | Discharge: 2012-12-21 | Disposition: A | Discharge status: Home / Self Care | Payer: Self-pay | Attending: Emergency Medicine | Admitting: Emergency Medicine

## 2012-12-21 DIAGNOSIS — G4730 Sleep apnea, unspecified: Secondary | ICD-10-CM | POA: Insufficient documentation

## 2012-12-21 DIAGNOSIS — Z794 Long term (current) use of insulin: Secondary | ICD-10-CM | POA: Insufficient documentation

## 2012-12-21 DIAGNOSIS — E785 Hyperlipidemia, unspecified: Secondary | ICD-10-CM | POA: Insufficient documentation

## 2012-12-21 DIAGNOSIS — Z7982 Long term (current) use of aspirin: Secondary | ICD-10-CM | POA: Insufficient documentation

## 2012-12-21 DIAGNOSIS — L259 Unspecified contact dermatitis, unspecified cause: Secondary | ICD-10-CM | POA: Insufficient documentation

## 2012-12-21 DIAGNOSIS — L539 Erythematous condition, unspecified: Secondary | ICD-10-CM | POA: Insufficient documentation

## 2012-12-21 DIAGNOSIS — M129 Arthropathy, unspecified: Secondary | ICD-10-CM | POA: Insufficient documentation

## 2012-12-21 DIAGNOSIS — R739 Hyperglycemia, unspecified: Secondary | ICD-10-CM

## 2012-12-21 DIAGNOSIS — I10 Essential (primary) hypertension: Secondary | ICD-10-CM | POA: Insufficient documentation

## 2012-12-21 DIAGNOSIS — E1069 Type 1 diabetes mellitus with other specified complication: Secondary | ICD-10-CM | POA: Insufficient documentation

## 2012-12-21 LAB — POCT I-STAT, CHEM 8
BUN: 13 mg/dL (ref 6–23)
Calcium, Ion: 1.08 mmol/L — ABNORMAL LOW (ref 1.12–1.23)
Chloride: 102 mEq/L (ref 96–112)
Creatinine, Ser: 1 mg/dL (ref 0.50–1.35)
Glucose, Bld: 402 mg/dL — ABNORMAL HIGH (ref 70–99)
HCT: 45 % (ref 39.0–52.0)
Hemoglobin: 15.3 g/dL (ref 13.0–17.0)
Potassium: 4 mEq/L (ref 3.5–5.1)
Sodium: 133 mEq/L — ABNORMAL LOW (ref 135–145)
TCO2: 22 mmol/L (ref 0–100)

## 2012-12-21 LAB — GLUCOSE, CAPILLARY
Glucose-Capillary: 403 mg/dL — ABNORMAL HIGH (ref 70–99)
Glucose-Capillary: 336 mg/dL — ABNORMAL HIGH (ref 70–99)
Glucose-Capillary: 337 mg/dL — ABNORMAL HIGH (ref 70–99)

## 2012-12-21 MED ORDER — CALAMINE EX LOTN: TOPICAL_LOTION | CUTANEOUS | Status: DC | PRN

## 2012-12-21 MED ORDER — INSULIN ASPART 100 UNIT/ML ~~LOC~~ SOLN
10.0000 [IU] | Freq: Once | SUBCUTANEOUS | Status: AC
  Administered 2012-12-21: 10 [IU] via SUBCUTANEOUS
  Filled 2012-12-21: qty 1

## 2012-12-21 MED ORDER — DIPHENHYDRAMINE HCL 12.5 MG/5ML PO ELIX
25.0000 mg | ORAL_SOLUTION | Freq: Once | ORAL | Status: AC
  Administered 2012-12-21: 25 mg via ORAL
  Filled 2012-12-21: qty 10

## 2012-12-21 MED ORDER — HYDROCORTISONE 1 % EX CREA: TOPICAL_CREAM | CUTANEOUS | Status: DC

## 2012-12-21 MED ORDER — DIPHENHYDRAMINE HCL 25 MG PO CAPS
25.0000 mg | ORAL_CAPSULE | Freq: Once | ORAL | Status: DC
  Filled 2012-12-21: qty 1

## 2012-12-21 NOTE — ED Notes (Signed)
Patient states that he has had a rash to his arms and abdomen since Sunday. States rash itches and burns.

## 2012-12-21 NOTE — ED Notes (Signed)
Patient initially offered a benadryl capsule. Patient states he is unable to swallow pills. Patient then offered liquid benadryl.

## 2012-12-21 NOTE — ED Provider Notes (Signed)
History     CSN: [id number]  Arrival date & time 12/21/12  0144   First MD Initiated Contact with Patient 12/21/12 0241      Chief Complaint  Patient presents with  . Rash   HPI  History provided by the patient. Patient is a 52 year old male with history of hypertension, hyperlipidemia and diabetes who presents with complaints of persistent purpuric rash to his bilateral arms. Rash first began on Sunday. He does state that he was outside cutting down a tree at that time. He is unsure of any contact with poison oak or poison ivy. He does admit to scratching his arms frequently. He has not scratched to the point of any bleeding. He denies any pain to the area on the itching. Patient did use some lemon juice over the areas which seem to help with the itching. He has not used any other treatments for symptoms. Denies any other aggravating or alleviating factors. No other associated symptoms. No fever, chills or sweats.    Past Medical History  Diagnosis Date  . Diabetes mellitus   . Hypertension   . Arthritis   . Hyperlipidemia   . Sleep apnea     cpap    Past Surgical History  Procedure Laterality Date  . Forearm surgery    . Muscle biopsy    . Colonoscopy  05/13/11    9 adenomas    Family History  Problem Relation Age of Onset  . Heart disease Mother   . Heart disease Father   . Colon cancer Neg Hx   . Stomach cancer Neg Hx     History  Substance Use Topics  . Smoking status: Never Smoker   . Smokeless tobacco: Not on file  . Alcohol Use: No      Review of Systems  Constitutional: Negative for fever, chills and diaphoresis.  Skin: Positive for rash.  All other systems reviewed and are negative.    Allergies  Review of patient's allergies indicates no known allergies.  Home Medications   Current Outpatient Rx  Name  Route  Sig  Dispense  Refill  . albuterol (PROVENTIL HFA;VENTOLIN HFA) 108 (90 BASE) MCG/ACT inhaler   Inhalation   Inhale 2 puffs into  the lungs every 6 (six) hours as needed for wheezing.         [name] aspirin EC 81 MG tablet   Oral   Take 81 mg by mouth daily.         [name] atorvastatin (LIPITOR) 40 MG tablet   Oral   Take 40 mg by mouth daily.         . insulin aspart (NOVOLOG) 100 UNIT/ML injection   Subcutaneous   Inject 45 Units into the skin 3 (three) times daily before meals.          . insulin glargine (LANTUS) 100 UNIT/ML injection   Subcutaneous   Inject 40 Units into the skin 2 (two) times daily.            BP 129/71  Pulse 77  Temp(Src) 98.7 F (37.1 C) (Oral)  Resp 18  SpO2 99%  Physical Exam  Nursing note and vitals reviewed. Constitutional: He appears well-developed and well-nourished. No distress.  HENT:  Head: Normocephalic.  Cardiovascular: Normal rate and regular rhythm.   Pulmonary/Chest: Effort normal and breath sounds normal. No respiratory distress. He has no wheezes. He has no rales.  Abdominal: Soft.  Musculoskeletal: Normal range of motion.  Skin: Skin is warm. Rash  noted. There is erythema.  Subtle areas of maculopapular lesions with erythema to the bilateral forearms. There are some secondary excoriations present. No induration of the skin. No increased warmth. No erythematous streaks.    ED Course  Procedures   Results for orders placed during the hospital encounter of 12/21/12  GLUCOSE, CAPILLARY      Result Value Range   Glucose-Capillary 403 (*) 70 - 99 mg/dL   Comment 1 Documented in Chart     Comment 2 Notify RN    GLUCOSE, CAPILLARY      Result Value Range   Glucose-Capillary 336 (*) 70 - 99 mg/dL  GLUCOSE, CAPILLARY      Result Value Range   Glucose-Capillary 337 (*) 70 - 99 mg/dL   Comment 1 Documented in Chart     Comment 2 Notify RN    POCT I-STAT, CHEM 8      Result Value Range   Sodium 133 (*) 135 - 145 mEq/L   Potassium 4.0  3.5 - 5.1 mEq/L   Chloride 102  96 - 112 mEq/L   BUN 13  6 - 23 mg/dL   Creatinine, Ser 1.00  0.50 - 1.35 mg/dL    Glucose, Bld 402 (*) 70 - 99 mg/dL   Calcium, Ion 1.08 (*) 1.12 - 1.23 mmol/L   TCO2 22  0 - 100 mmol/L   Hemoglobin 15.3  13.0 - 17.0 g/dL   HCT 45.0  39.0 - 52.0 %         1. Contact dermatitis   2. Hyperglycemia       MDM  Patient seen and evaluated. Patient well-appearing in no acute distress. Patient with pruritic rash bilateral upper extremities. Consistent with contact dermatitis. Given his elevated blood sugars we'll not treat with steroids. Will advise on topical ointments.  Blood sugar elevated. No signs of anion gap. Patient instructed to have close followup of his elevated sugars with PCP.      Martie Lee, [name] 12/21/12 [phone]

## 2012-12-21 NOTE — ED Provider Notes (Signed)
Medical screening examination/treatment/procedure(s) were performed by non-physician practitioner and as supervising physician I was immediately available for consultation/collaboration.  Kalman Drape, MD 12/21/12 [phone]

## 2012-12-21 NOTE — ED Notes (Signed)
Patient with rash and itching to BUE. Patient states he was cutting down a tree and may have gotten into poison ivy/oak. Patient states he has used lemon juice at home to help with the itching. Patient reports a hx of diabetes.

## 2012-12-24 LAB — AMB EXT HGBA1C: Hemoglobin A1c, External: 13.4

## 2012-12-24 LAB — AMB EXT LDL-C: LDL-C, External: 123

## 2012-12-25 LAB — AMB EXT EJECTION FRACTION

## 2013-02-12 LAB — AMB POC GLUCOSE BLOOD, BY GLUCOSE MONITORING DEVICE: Glucose POC: 196 mg/dL

## 2013-02-12 MED ORDER — CARVEDILOL 3.125 MG TAB
3.125 mg | ORAL_TABLET | Freq: Two times a day (BID) | ORAL | Status: DC
Start: 2013-02-12 — End: 2013-06-04

## 2013-02-12 NOTE — Progress Notes (Signed)
Kendrick Ranch, MSN, RN, FNP-BC, BC-ADM  Office Visit  02/12/2013  [facility]  Subjective:   Alec Martin is a 52 y.o. male.  Chief Complaint   Patient presents with   ??? Blood sugar problem   ??? Heart Problem     pt had a heart attack 3 months ago and has not followed up with a dr since     History of Present Illness:  12/27/12, was hospitalized in [place] for a week.  Had chest pain.    Currently, he has noticed that he gets tired easily, dizzy if he changes positions too quickly; still has occasional chest pain and occasional left arm pain.    Current Medications:  Aspirin ec 81mg   Carvedilol 3.125mg  bid  Lantus 8 units daily (in the morning)  Humalog 8 units with meals  Lisinopril 2.5mg  at 11pm  Omeprazole 20mg .    Surgeries: History reviewed. No pertinent past surgical history.  Past Medical History: History reviewed. No pertinent past medical history.  There are no active problems to display for this patient.    Social History:  reports that he has never smoked. He does not have any smokeless tobacco history on file. He reports that he does not drink alcohol or use illicit drugs.    Family History: History reviewed. No pertinent family history.    Allergies: No Known Allergies    Review of Systems:  CARDIOVASCULAR: Denies: dyspnea on exertion, orthopnea, paroxysmal nocturnal dyspnea, edema, palpitations  RESPIRATORY: Denies: cough, shortness of breath, wheezing  Objective:   BP 139/84   Pulse 76   Temp(Src) 97.5 ??F (36.4 ??C) (Oral)   Ht 5' 5.25" (1.657 m)   Wt 178 lb (80.74 kg)   BMI 29.41 kg/m2    AMB POC GLUCOSE BLOOD, BY GLUCOSE MONITORING DEVICE       Result Value Range    Glucose POC 196 non fasting       Physical Examination:   General appearance: alert, cooperative, no distress, appears stated age  Eyes: no papilledema  Neck: supple, symmetrical, trachea midline, no adenopathy, thyroid: not enlarged, symmetric, no tenderness/mass/nodules, no carotid bruit and no JVD  Lungs:  clear to auscultation bilaterally  Heart: regular rate and rhythm, S1, S2 normal, no murmur, click, rub or gallop  Abdomen: soft, non-tender. Bowel sounds normal. No masses,  no organomegaly  Extremities: extremities normal, atraumatic, no cyanosis or edema  Pulses: 2+ and symmetric  Assessment / Plan:       ICD-9-CM    1. Diabetes 250.00 AMB POC GLUCOSE BLOOD, BY GLUCOSE MONITORING DEVICE     HEMOGLOBIN A1C     MICROALBUMIN, UR, RAND     PR COLLECTION VENOUS BLOOD,VENIPUNCTURE   2. Hypertension 401.9 METABOLIC PANEL, COMPREHENSIVE     carvedilol (COREG) 3.125 mg tablet     lisinopril (PRINIVIL, ZESTRIL) 2.5 mg tablet   3. History of MI (myocardial infarction) 412      Follow-up Disposition:  Return in about 4 weeks (around 03/12/2013) for as a walk in; may need cardiac consult.  Plan:  -do all meds from [facility].  He cannot afford to buy any medicines.  (research regarding carvedilol and lisinopril 2.5mg , if substitutions can be chosen that are available from [facility])  -arrange for 1 vial of NPH and 2 vials of regular  (he will need to use NPH bid and regular tid)  -he reports that he does have sleep apnea, untreated  -get hospital records.    -has  he had test for sleep apnea?

## 2013-02-12 NOTE — Progress Notes (Signed)
Alec Martin  Request To Health Care Provider For Disclosure Of Confidential Health Information document signed by patient for retrieval of records from [facility]. Will be faxed and confirmation to be attached and returned to [facility] with daily [facility] paperwork. Lantus (or Levemir) and Humalog to be prescribed by provider and obtained through [facility]. Explained [facility] program and required financial screening and application. Assisted patient with partial completion of application. Patient to return fully completed [facility] application and income documentation to [facility] at his earliest convenience. [facility] calendar given. Two documents from patient's [facility] visit to be scanned into chart. Expressed understanding of above stated items and had no further questions.  Richardine Service, RN

## 2013-02-12 NOTE — Telephone Encounter (Signed)
Nurses,  This patient will soon run out of his insulins.  He is applying for PAP insulin.  In the meantime, I will switch him to NPH and regular insulin.    He will need to take it as follows:  NPH 5 units sc every 12 hours (this will replace his Lantus; he will need a total of 10 units NPH to replace his once a day 8 units of Lantus)  Regular insulin, 8 units sc 30-60 minutes BEFORE meals three times a day (he currently takes his humalog insulin immediately after meals.  I reviewed with him but please remind him that this substitution requires him to use this insulin BEFORE meals because it takes longer to start working).    I'll document these in the chart.  Please call him to arrange for a meeting time/place for these insulins.

## 2013-02-13 LAB — METABOLIC PANEL, COMPREHENSIVE
A-G Ratio: 1.5 (ref 1.1–2.2)
ALT (SGPT): 24 U/L (ref 12–78)
AST (SGOT): 8 U/L — ABNORMAL LOW (ref 15–37)
Albumin: 4.1 g/dL (ref 3.5–5.0)
Alk. phosphatase: 86 U/L (ref 45–117)
Anion gap: 10 mmol/L (ref 5–15)
BUN/Creatinine ratio: 11 — ABNORMAL LOW (ref 12–20)
BUN: 11 MG/DL (ref 6–20)
Bilirubin, total: 0.5 MG/DL (ref 0.2–1.0)
CO2: 26 mmol/L (ref 21–32)
Calcium: 9.6 MG/DL (ref 8.5–10.1)
Chloride: 104 mmol/L (ref 97–108)
Creatinine: 1.02 MG/DL (ref 0.45–1.15)
GFR est AA: >60 mL/min/{1.73_m2} (ref >60)
GFR est non-AA: >60 mL/min/{1.73_m2} (ref >60)
Globulin: 2.8 g/dL (ref 2.0–4.0)
Glucose: 171 mg/dL — ABNORMAL HIGH (ref 65–100)
Potassium: 4.2 mmol/L (ref 3.5–5.1)
Protein, total: 6.9 g/dL (ref 6.4–8.2)
Sodium: 140 mmol/L (ref 136–145)

## 2013-02-13 LAB — MICROALBUMIN, UR, RAND W/ MICROALB/CREAT RATIO
Creatinine, urine random: 179.4 mg/dL
Microalbumin,urine random: 0.93 MG/DL
Microalbumin/Creat ratio (mg/g creat): 5 mg/g (ref 0–30)

## 2013-02-13 LAB — HEMOGLOBIN A1C WITH EAG
Est. average glucose: 255 mg/dL
Hemoglobin A1c: 10.5 % — ABNORMAL HIGH (ref 4.2–6.3)

## 2013-02-13 NOTE — Progress Notes (Signed)
Quick Note:    A1C 10.5 (eAG 255), patient has not been taking full doses of insulin; arrangements to provide insulin are being made. Encourage patient to take his doses exactly as prescribed for the new NPH and Regular. He may need to be on more insulin for a short amount of time now to overcome "insulin resistance" which happens when the blood glucose has been high for a while. Kidney tests normal.  ______

## 2013-02-14 NOTE — Telephone Encounter (Addendum)
RN spoke with Desma Maxim, FNP, regarding availability of one Levemir vial in stock, and also Humalog vials .  Misty Stanley advised that pt should have Levemir instead of NPH, and he is to take 8 units daily.  She also advised that pt may continue to take Humalog 8 units directly after meals, tid.  RN called pt and made arrangements for him to come to [facility] tomorrow, 02/15/13, to pick up one vial of Levemir and one vial of Humalog, and gave him the directions for each per Misty Stanley. Pt stated that he will run out of his insulin on 02/16/13.   RN also encouraged pt to bring the completed TPC application with required documentation.  He said he would bring this also on 02/15/13.   Pt expressed understanding of the above information.  Brooks Sailors.

## 2013-02-14 NOTE — Progress Notes (Signed)
Quick Note:    RN spoke with Desma Maxim, FNP, regarding availability of one Levemir vial in stock, and also Humalog vials . Misty Stanley advised that pt should have Levemir instead of NPH, and he is to take 8 units daily. She also advised that pt may continue to take Humalog 8 units directly after meals, tid. RN called pt and made arrangements for him to come to [facility] tomorrow, 02/15/13, to pick up one vial of Levemir and one vial of Humalog, and gave him the directions for each per Misty Stanley. Pt stated that he will run out of his insulin on 02/16/13. RN also encouraged pt to bring the completed TPC application with required documentation. He said he would bring this also on 02/15/13. RN also gave pt lab results as stated in Lisa's note. Pt expressed understanding of the above information. Brooks Sailors.  ______

## 2013-02-14 NOTE — Telephone Encounter (Signed)
Pt called [facility] office and left message asking if his sister can pick up his insulin tomorrow.  Rn called his number and left a message that this would be ok.  Alec Martin.

## 2013-02-15 NOTE — Progress Notes (Signed)
Patient presents to [facility] to pick up insulin.  RN gives patient one vial of of Levemir insulin and one vial of Humalog insulin.  Reviewed dosage with patient per provider notes, patient is taking 8 units of each. Patient signed for pick up of these vials.  Patient asks about needles for his pen.  Did not have any on this van but Deidre Ala, RN advises they have some at [facility] site and patient will go there now and pick them up.  Confirmed meds were sent electronically to [facility] in [place].  Patient given calendar with address to [facility] site to pick up needles.  Patient expresses understanding of above information and denies questions at discharge. Camelia Eng, RN

## 2013-02-16 ENCOUNTER — Encounter

## 2013-02-16 NOTE — Progress Notes (Signed)
In review of his records, he is taking carvedilol 3.125mg  po bid and lisinopril 2.5mg  po qday s/p MI.  He has ischemic cardiomyopathy and reduced LVEF.  He has continued to have mild chest and left arm pain.  MRI of c-spine showed cervical degenerative disc disease with disc bulging and spondylosis.    He has uncontrolled diabetes.  A1C in the hospital was 13.4.  Will likely need to augment his basal (Levemir) insulin and consider using it twice a day.  He had a normal cardiac stress test and normal lipid profile in the hospital (except for LDL 123).

## 2013-02-18 NOTE — Progress Notes (Signed)
Faxed to Morene Rankins at [phone] with the Doc RX/PAP program the following: new PAP for Lantus or Levemir 8 units sq daily in am and humalog 8 units sq tid with meals. Ordered by Desma Maxim. [name], RN

## 2013-03-05 NOTE — Progress Notes (Signed)
Faxed to Morene Rankins at [phone] with the Doc RX/PAP program the following: [facility] PAP for Humalog 8 units TID with meals for 24 units a day. Signed by Dr. Illene Labrador. And Novonordisk PAP for Levemir vials 8 units q am signed by dr. Illene Labrador. [name], RN

## 2013-03-14 LAB — AMB POC GLUCOSE BLOOD, BY GLUCOSE MONITORING DEVICE: Glucose POC: 265 mg/dL

## 2013-03-14 NOTE — Progress Notes (Signed)
Pt not known to me, here for f/up DM. Told my nurse that he is having acute chest pain, left substernal, that was the same as when he had MI 3 months ago. Was treated at [facility] with a week admission. Doesn't recall if he had a cath or stents.   Has some ? Radiation of pain to left shoulder  Vitals are stable and blood sugar is 260    I quickly assessed him and am calling 911 as he reiterates the same history to me that he has acute left sided chest pain  He is being given an aspirin today.  He has some paperwork from the hospital but he needs to be seen emergently, not wait for me to review his folder    F/up in one week in order to go over this ER eval and possible re-admission

## 2013-03-14 NOTE — Telephone Encounter (Signed)
Pt's sister, Arcenio Mullaly (on PHI), called [facility] office and inquired about patient's insulin, which will need to be discarded on 03/16/13, as it will be 28 days since it was opened.  She is with pt in the ED at [facility] (sent to ER from [facility] today).   She stated that it has not been decided whether pt will need to stay in the hospital.   RN advised her that pt's insulin from the PAP has not yet arrived (just faxed 03/05/13 to [facility]ins), but that it may be possible to arrange for insulin from stock  be given to him.   Advised her to let [facility] office know when they know more about whether pt will be admitted.  She agreed to do so.  THis message will be routed to Rolm Gala, Environmental education officer.  Brooks Sailors.

## 2013-03-14 NOTE — Progress Notes (Signed)
Results for orders placed in visit on 03/14/13   AMB POC GLUCOSE BLOOD, BY GLUCOSE MONITORING DEVICE       Result Value Range    Glucose POC 265 non fasting

## 2013-03-15 NOTE — Telephone Encounter (Signed)
RN made contact with patient by phone.  He was discharged from the ER, but they did tell him he had fluid around his heart.  He says his insulin is expiring, but he has enough to last him a few days.  He would like to pick up at [facility] a vial to hold him until his PAP medicine comes in if possible.  RN told him we would call him if that is not possible.  Patient expresses understanding of above information and denies questions at close of call. Camelia Eng, RN    RN reviewed chart.  Patient uses Levemir insulin and Humalog insulin.  There is plenty of Humalog insulin vials in the fridge and Levemir pens.  Will route message to Dr. Illene Labrador to see if it is okay to give a vial and also copy Rolm Gala, RN supervisor as well as Leeanne Mannan, RN in charge of PAP program.  Camelia Eng, RN

## 2013-03-15 NOTE — Telephone Encounter (Signed)
Ok to give 1 vial or box of pens of each, I will check to see if we have levemir.  Looks like we sent him to the hospital yesterday, is he out already?

## 2013-03-19 NOTE — Telephone Encounter (Signed)
Pt called and asked to sign a records release for [facility] provider to get recent records while at clinic this afternoon.

## 2013-03-19 NOTE — Telephone Encounter (Signed)
Pt is out of insulin and is able to walk to to [facility] clinic . Will send insulin Levemir pens 1 box of 5 and Humalog pen 1 box of 5 to clinic for pick up between 1 and 3:30p today. Pt informed. Reviewed PAP insulin take 8 to 12 weeks to arrive once paperwork completed and faxed. Press photographer on site today Dorien Chihuahua RN informed

## 2013-03-19 NOTE — Progress Notes (Signed)
Pt came to clinic and picked up 1 box of Humalog pens, 1 box of Levemir pens, and 1 box of pen needles.  Pt signed authorization for release of medical records from [facility] for 03/14/13 ER visit.  Donelle Hise Concha Se, RN

## 2013-03-25 NOTE — Progress Notes (Signed)
His last A1C was > 10 on these doses.  He just picked up some insulin.  Before he runs out, please have him measure 3 fasting glucoses and 3 post-prandial (2 hours after dinner, for example) glucoses so that we have some information on how we might adjust his insulin to improve his A1C.  This follow up should be with a provider.

## 2013-03-25 NOTE — Progress Notes (Signed)
Logged into logbook that pt has 4 vials of Humalog insulin and three vials of Levemir vials to deliver. Pt was a no show to appt on 03/21/13. Routing message to provider to see if okay to deliver insulin to pt.Misty Stanley, will dosages of humalog as 8 units tid be the same? Levemir 8 units sc in the morning?  [name], RN

## 2013-04-09 NOTE — Telephone Encounter (Signed)
RN attempted to make contact with patient by phone.  Mailbox was full.  No way to leave message at home and cell phone number.  Will route this to CBN to try again. If we reach patient we need to make a follow up appointment with provider, but patient needs to log glucoses as directed below before appointment.   Alec Martin A Alec Matthews, RN    Note from [employer]:  His last A1C was > 10 on these doses.  He just picked up some insulin.  Before he runs out, please have him measure 3 fasting glucoses and 3 post-prandial (2 hours after dinner, for example) glucoses so that we have some information on how we might adjust his insulin to improve his A1C.  This follow up should be with a provider.

## 2013-04-09 NOTE — Progress Notes (Signed)
His last A1C was > 10 on these doses.  He just picked up some insulin.  Before he runs out, please have him measure 3 fasting glucoses and 3 post-prandial (2 hours after dinner, for example) glucoses so that we have some information on how we might adjust his insulin to improve his A1C.  This follow up should be with a provider.

## 2013-04-10 NOTE — Telephone Encounter (Signed)
Attempt to reach on both numbers in demographics. Neither number would take messages. Letter done.

## 2013-05-06 NOTE — Telephone Encounter (Signed)
Patient never received M. Meadows letter asking him to call for an appointment and take fasting readings prior to visit.  However, he was calling for an appointment.  I could not find one at a location where he could reach in the month of November.  I encouraged him to make the line at [facility] on Thursday, 11/6, and he said he would get there at 5am.    Alec Martin

## 2013-05-08 NOTE — Telephone Encounter (Signed)
Patient's sister called late Tuesday and said she had a call from [facility] and they were going to fax a prescription to Dr. Illene Labrador, and she wanted me to check to see if prescription was here.  It wasn't.  I asked Dr. Illene Labrador about it, and she had never heard of [facility], but she said we do have his medicine, and he just needs to make the line and see the provider.  I returned the call this morning and talked with the sister, and she said she would bring him to the [facility] either Thursday or Saturday.    Vinetta Bergamo April      .

## 2013-05-08 NOTE — Telephone Encounter (Signed)
This morning there was a fax from [employer] PAP.  It is a reorder request.  I put it in [name]'s box.    Vinetta Bergamo April

## 2013-05-09 NOTE — Progress Notes (Signed)
Requests flu vaccine; denies fever, egg allergy.  Immunization given per protocol by Mary Meadows, RN and recorded in VIIS.  VIS information sheet given, explained possible S/E.  Reviewed sx indicating need to be seen in ER.  Pt had no adverse reaction at time of discharge. Joal Eakle A Marasia Newhall, RN

## 2013-06-04 LAB — AMB POC HEMOGLOBIN (HGB): Hemoglobin (POC): 15.2

## 2013-06-04 LAB — AMB POC GLUCOSE BLOOD, BY GLUCOSE MONITORING DEVICE: Glucose POC: 379 mg/dL

## 2013-06-04 NOTE — Progress Notes (Signed)
HISTORY OF PRESENT ILLNESS  Alec Martin is a 52 y.o. male.  HPI Comments: Reports neuropathic sx in foot are much better, and reflux sx improved.  Checking glucoses ac each meal and they continue to be 200s/300s.  Has been inching up on levemir as directed last visit, 1 unit q night (whenever hs glucose is >200, he increases the levemir by 1 unit the next night), so now he is on 20 units at hs, with 8 units humalog each meal.  Eating 3 meals a day with a snack between breakfast and lunch and lunch and supper.  24 hour recall for foods/drinks yesterday was healthy, about 1 carb/meal with 1 carb snacks.  No sx of hypoglycemia.    Glucose at hs last night arounde 240, today fasting over 300 here.        Reports known h/o diabetes x 3 years, weight initially 245 and sounds as if uncontrolled diabetes has dropped his weight to current, about 60 lb lighter..    Chief c/o is tiredness during the day.  Finds exercising difficult because he's tired, isn't working and states he 'isn't doing anything'.  H/o sleep apnea dx when he was at the heavier weight, still using cpap but finds it hard to wear, no update of equipment.  Family members still report he snores a lot.    H/o ischemic cardiomyopathy with last ef 46%. Taking lisinopril and carvedolol as directed.        Diabetes        ROS    Physical Exam   Constitutional: He appears well-developed and well-nourished. No distress.       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  At about .5 units insulin/kg now.  Will have him continue to increase levemir as above for 3 more weeks.  If still no improvement in glucoses next visit, could consider metformin in case insulin-resistance is a major factor.  At 1 unit/kg, total daily dose of insulin would need to be 80 units, for instance 50 levemir and 10 of humalog each meal.  Possible sleep apnea-- sleep eval through [facility].  Cardiomyopathy, ischemic.  On appropriate meds  except no statin, need to discuss next visit.

## 2013-06-04 NOTE — Progress Notes (Signed)
Chief Complaint   Patient presents with   ??? Diabetes     f/u     Results for orders placed in visit on 06/04/13   AMB POC GLUCOSE BLOOD, BY GLUCOSE MONITORING DEVICE       Result Value Range    Glucose POC 379 fsting     AMB POC HEMOGLOBIN (HGB)       Result Value Range    Hemoglobin (POC) 15.2

## 2013-06-04 NOTE — Progress Notes (Signed)
Jossie Ng  Labs drawn today - reviewed use of dial tell to access lab results. Advised that any abnormal results will be address by CBN/provider via telephone. Reviewed escribed rx's for Coreg, Lisinopril, Amitriptyline, and Zantac. Confirmed pharmacy of choice. Consultation for sleep evaluation scheduled for Thursday, 06/13/2013 at 11:30 am with Dr. Theodoro Kos at [facility], [address] [place], [place] [address], telephone [phone]. Appointment/location/contact information provided to patient. Spoke to Beatrice in their office. Explained Care Card/financial assistance process and emphasized need to inquire about this at outpatient registration. Advised that [facility] will also attempt to establish contact either in the hospital/provider's office or via telephone and further emphasized the necessity of this additional financial screening. Follow up [facility] appointment scheduled for 06/25/2013 at 0900 with M. McFerren, [name] at [facility]. Appointment card given to patient. Expressed understanding of above stated items and had no further questions.Richardine Service, RN

## 2013-06-05 LAB — METABOLIC PANEL, COMPREHENSIVE
A-G Ratio: 1.2 (ref 1.1–2.2)
ALT (SGPT): 20 U/L (ref 12–78)
AST (SGOT): 10 U/L — ABNORMAL LOW (ref 15–37)
Albumin: 3.8 g/dL (ref 3.5–5.0)
Alk. phosphatase: 106 U/L (ref 45–117)
Anion gap: 8 mmol/L (ref 5–15)
BUN/Creatinine ratio: 9 — ABNORMAL LOW (ref 12–20)
BUN: 10 MG/DL (ref 6–20)
Bilirubin, total: 0.4 MG/DL (ref 0.2–1.0)
CO2: 27 mmol/L (ref 21–32)
Calcium: 9.4 MG/DL (ref 8.5–10.1)
Chloride: 100 mmol/L (ref 97–108)
Creatinine: 1.1 MG/DL (ref 0.45–1.15)
GFR est AA: >60 mL/min/{1.73_m2} (ref >60)
GFR est non-AA: >60 mL/min/{1.73_m2} (ref >60)
Globulin: 3.2 g/dL (ref 2.0–4.0)
Glucose: 358 mg/dL — ABNORMAL HIGH (ref 65–100)
Potassium: 4.6 mmol/L (ref 3.5–5.1)
Protein, total: 7 g/dL (ref 6.4–8.2)
Sodium: 135 mmol/L — ABNORMAL LOW (ref 136–145)

## 2013-06-05 LAB — HEMOGLOBIN A1C WITH EAG
Est. average glucose: 312 mg/dL
Hemoglobin A1c: 12.5 % — ABNORMAL HIGH (ref 4.2–6.3)

## 2013-06-05 LAB — MICROALBUMIN, UR, RAND W/ MICROALB/CREAT RATIO
Creatinine, urine random: 155.32 mg/dL
Microalbumin,urine random: 0.67 MG/DL
Microalbumin/Creat ratio (mg/g creat): 4 mg/g (ref 0–30)

## 2013-06-06 NOTE — Progress Notes (Signed)
Quick Note:    A1C higher than previous, LM on dialtell to continue the recs made at the last visit, should have f/u soon.  ______

## 2013-06-17 NOTE — Telephone Encounter (Signed)
Patient said he is completely out of Insulin. Said he called in last week and someone gave him a number to call but the number was out of order.

## 2013-06-17 NOTE — Progress Notes (Signed)
Per phone call with Leeanne Mannan, RN, patient's glucoses are still 300s or over, he thinks he's up to 30 units of levemir once daily, and is still taking 8 units humalog with each meal. Leaving to go out of town emergently in about 4 days and may not be back until 1/15, doesn't have enough insulin.   Will have him increase the humalog to 10 units with each meal now, come in this week for nurse so we can determine how much levemir he his taking, will increase that dose and supply him with levemir pens/show him how to use, and give him more humalog. If he is on 30 units levemir, will have him increase by about 10 units and then continue to step up as he has been by 1 unit every night for fbs over 200.

## 2013-06-17 NOTE — Telephone Encounter (Signed)
I called pt and he is not quite out of insulin but has one vial of Humalog left and 1/2 vial of Levemir left. He is not coming for a follow up appt until 07/09/13 and heading out of town by Friday for family emergencies. He reports that he checks his sugars tid and they are consistently over 400 even on the insulin doses he is taking and that he is taking a "full syringe" of Levemir which he thinks is reading at 40 units. Dr. Illene Labrador notified and she called me back. She would like pt to come to [facility]. [facility] tomorrow with his insulin and his syringes to review with a nurse and his insulin may be adjusted and reordered after that visit. She asked me to instruct pt to increase his humalog to 10 units TID and to keep his Levemir the same at this point. Also, we are sending to the [facility] one box of Levemir flexpens and one box of humalog kwikpens in the meantime since we do not have any stock of the vials of these insulins available. [name], RN

## 2013-06-18 NOTE — Progress Notes (Signed)
Documented pt instructions and reviewed information about Levemir Flexpens and Humalog Kwikpens.  Pt stated he has used pens before.  Printed AVS and provided to pt.  Pt indicated understanding and had no questions.  Ryanna Teschner Concha Se, RN

## 2013-06-18 NOTE — Patient Instructions (Signed)
You were given 1 box of Humalog Kwikpens and 1 box of Levemir Flexpens and 1 box of pen needles.  Continue using Humalog at 10 units after each meal.  Increase Levemir to 40 units at bedtime and increase by 1 unit every time your blood sugar is > 200 at bedtime.  Record your blood sugar readings and bring to next appointment on 07/09/13.  Keirsten Matuska Concha Se, RN

## 2013-06-19 NOTE — Telephone Encounter (Signed)
See clinical note support note Dorien Chihuahua, RN) regarding [facility] visit on 06/18/13.  Brooks Sailors.

## 2013-07-09 LAB — AMB POC URINALYSIS DIP STICK MANUAL W/O MICRO
Bilirubin (UA POC): NEGATIVE
Blood (UA POC): NEGATIVE
Ketones (UA POC): NEGATIVE
Leukocyte esterase (UA POC): NEGATIVE
Nitrites (UA POC): NEGATIVE
Protein (UA POC): NEGATIVE mg/dL
Specific gravity (UA POC): 1.015 (ref 1.001–1.035)
Urobilinogen (UA POC): 0.2 (ref 0.2–1)
pH (UA POC): 5 (ref 4.6–8.0)

## 2013-07-09 LAB — AMB POC GLUCOSE BLOOD, BY GLUCOSE MONITORING DEVICE: Glucose POC: 357 mg/dL

## 2013-07-09 NOTE — Progress Notes (Signed)
E-mail sent to Leeanne MannanKatherine Dowell, RN, Trinda PascalSandra Newton, LPN-B and Rolm GalaKathy Carroll, RN Manager to see if we have stock insulin that we can get [facility] next Tuesday for patient so that he does not run out before his insulin comes in from PAP program.  Message also sent through connect care to the same people.  Camelia EngAMANDA A Serafino Burciaga, RN

## 2013-07-09 NOTE — Progress Notes (Signed)
S:  Established patient presents for a recheck after titrating insulin to get blood sugar under control at last visit last month.  Currently his long acting insulin, Levemir, is at 52 units daily and the humalog is at 12 units with each meal.  He states he is compliant with medication and checking blood sugars and they are consistently in upper 200's and 300's and have not come down significantly since last visit (despite insulin dosage being increased quite a bit).  He doses himself in the abdomen.  He keeps the insulin in a refrigerator.  He denies changes to activity or diet.  He stays with his sisters - one with with one sisteer and the next week with another sister.  He does not sleep much at night and wakes aroud 2-3 and will have a snack of crackers.  He has not checked his blood sugar at this time.  He naps a lot during the day.  Dr. [name] has ordered a sleep study evaluation on him but due to an emergency he was not able to make the appt so this needs to be rescheduled.    Patient Active Problem List   Diagnosis Code   ??? Ischemic cardiomyopathy 414.8   ??? Ejection fraction < 50% (40-45%) 785.9   ??? Degenerative disc disease, cervical 722.4   ??? Myocardial infarction, lateral wall, subsequent care 410.52   ??? Normal cardiac stress test V81.2   ??? Type II or unspecified type diabetes mellitus without mention of complication, not stated as uncontrolled 250.00     We reviewed his medication bottles (he brought all except the insulins) and med list was reviewed.  The only changes are the insulin dosages as noted above.    ROS:  Chest pain ongoing is improved but not resolved.  He has never been treated with nitroglycerin for this but was given ranitidine.  He denies SOB and edema.  Blurry vision for the past several weeks.  C/o fatigue.    O:  WNWD in NAD  Filed Vitals:    07/09/13 0859   BP: 139/87   Pulse: 93   Temp: 98 ??F (36.7 ??C)   TempSrc: Oral   Weight: 177 lb (80.287 kg)   Eyes: unable to visualize fundi due  to equipment problem  Neck:  No masses or thyromegaly  CV:  RRR, no M/R/G  Lungs:  CTAB  Ext:  No clubbing or edema    Encounter Diagnoses   Name Primary?   ??? Type II or unspecified type diabetes mellitus without mention of complication, not stated as uncontrolled Yes   ??? Diabetes mellitus    ??? Ischemic cardiomyopathy    ??? Blurred vision    ??? Fatigue    ??? Sleep disorder      Instructed him to keep a blood sugar log for next visit.  I reviewed his meter's memory and it confirms his reported blood sugars.  I tried to update the date and time so that we may have a little more accurate idea of patterns.  I advised him that sleeping at night and not during the day would help blood sugar control.  Will have DCRN work on new appt with sleep center.    He is apparently getting insulin through PAP program and will need updated Rx's for this since his dosages have changed.  I send rx's to be printed and forwarded to the person in charge of the PAP program.  He will continue to titrate levemir up  when fasting blood sugar is > 200 and will also increase his humalog to 14 units tid with meals.  Total insulin dose will be 94 units.   We also reviewed sick day care if he becomes ill to the point that he is not eating.  If unable to eat at all, he should skip the humalog and decrease levemir to 40 units but continue to monitor blood sugars.  Next appt one month    Orders Placed This Encounter   ??? AMB POC GLUCOSE BLOOD, BY GLUCOSE MONITORING DEVICE   ??? AMB POC URINALYSIS DIP STICK MANUAL W/O MICRO   ??? insulin lispro (HUMALOG) 100 unit/mL injection     Sig: 14 Units by SubCUTAneous route three (3) times daily (after meals).     Dispense:  3 vial     Refill:  0   ??? insulin detemir (LEVEMIR) 100 unit/mL (3 mL) pen     Sig: 52 Units by SubCUTAneous route nightly. And increase by 1 unit the next night each night that hs glucose is over 200     Dispense:  50 mL     Refill:  0

## 2013-07-09 NOTE — Patient Instructions (Addendum)
Please call Alec Martin 2-3 weeks before your insulin runs out for a refill at [phone].    [facility] on Thursday, January 15th at 3:00 PM.  Please arrive at 2:45 PM.  [address]  [place]  [address]  [phone]    Make an appointment for 1 month with any provider.    Learning About Sleeping Well  What does sleeping well mean?  Sleeping well means getting enough sleep. How much sleep is enough varies among people.  The number of hours you sleep is not as important as how you feel when you wake up. If you do not feel refreshed, you probably need more sleep. Another sign of not getting enough sleep is feeling tired during the day.  The average total nightly sleep time is 7?? to 8 hours. Healthy adults may need a little more or a little less than this.  Why is getting enough sleep important?  Getting enough quality sleep is a basic part of good health. When your sleep suffers, your mood and your thoughts can suffer too. You may find yourself feeling more grumpy or stressed. Not getting enough sleep also can lead to serious problems, including injury, accidents, anxiety, and depression.  What might cause poor sleeping?  Many things can cause sleep problems, including:  ?? Stress. Stress can be caused by fear about a single event, such as giving a speech. Or you may have ongoing stress, such as worry about work or school.  ?? Depression, anxiety, and other mental or emotional conditions.  ?? Changes in your sleep habits or surroundings. This includes changes that happen where you sleep, such as noise, light, or sleeping in a different bed. It also includes changes in your sleep pattern, such as having jet lag or working a late shift.  ?? Health problems, such as pain, breathing problems, and restless legs syndrome.  ?? Lack of regular exercise.  How can you help yourself?  Here are some tips that may help you sleep more soundly and wake up feeling more  refreshed.  Your sleeping area   ?? Use your bedroom only for sleeping and sex. A bit of light reading may help you fall asleep. But if it doesn't, do your reading elsewhere in the house. Don't watch TV in bed.  ?? Be sure your bed is big enough to stretch out comfortably, especially if you have a sleep partner.  ?? Keep your bedroom quiet, dark, and cool. Use curtains, blinds, or a sleep mask to block out light. To block out noise, use earplugs, soothing music, or a "white noise" machine.  Your evening and bedtime routine   ?? Get regular exercise, but not within 3 to 4 hours before your bedtime.  ?? Create a relaxing bedtime routine. You might want to take a warm shower or bath, listen to soothing music, or drink a cup of noncaffeinated tea.  ?? Go to bed at the same time every night. And get up at the same time every morning, even if you feel tired.  What to avoid   ?? Limit caffeine (coffee, tea, caffeinated sodas) during the day, and don't have any for at least 4 to 6 hours before bedtime.  ?? Don't drink alcohol before bedtime. Alcohol can cause you to wake up more often during the night.  ?? Don't smoke or use tobacco, especially in the evening. Nicotine can keep you awake.  ?? Don't take naps  during the day, especially close to bedtime.  ?? Don't lie in bed awake for too long. If you can't fall asleep, or if you wake up in the middle of the night and can't get back to sleep within 15 minutes or so, get out of bed and go to another room until you feel sleepy.  ?? Don't take medicine right before bed that may keep you awake or make you feel hyper or energized. Your doctor can tell you if your medicine may do this and if you can take it earlier in the day.  If you can't sleep   ?? Imagine yourself in a peaceful, pleasant scene. Focus on the details and feelings of being in a place that is relaxing.  ?? Get up and do a quiet or boring activity until you feel sleepy.  ?? Don't drink any liquids after 6 p.m. if you wake up often  because you have to go to the bathroom.   Where can you learn more?   Go to [email or web address].huhttp://www.healthwise.net/[facility]  Enter [id number] in the search box to learn more about "Learning About Sleeping Well."   ?? 2006-2014 Healthwise, Incorporated. Care instructions adapted under license by [employer] (which disclaims liability or warranty for this information). This care instruction is for use with your licensed healthcare professional. If you have questions about a medical condition or this instruction, always ask your healthcare professional. Healthwise, Incorporated disclaims any warranty or liability for your use of this information.  Content Version: 10.2.346038; Current as of: September 12, 2012

## 2013-07-09 NOTE — Progress Notes (Signed)
The following was performed at the discharge station per provider:  Insulin diary given and RN demonstrated how to use it to keep track of glucose measurements.   RN explained patient needs to call [name] BreachKatherin Carmon for refills.  RN called Morene RankinsKatherine Carmon and ordered refills for Levemir 52 units subcutaneous nightly and increase by 1 unit the next night each night glucose is over 200.  Also ordered Insulin 14 units by route 3 x daily after meals. Stressed importance of calling Kathering Carmon when patient is 2-3 weeks before needing insulin. Provided patient with Rockie NeighboursKatherine Carmon's name and phone number.   Patient now with insulin increase, so was did not realize he would run out.  Prescriptions were printed, but on GoodRx, even with coupon these meds are over $800 and patient cannot afford.  RN advised I would check to see if we can obtain at office.  RN scheduled patient with sleep disorder doctor at [facility] location for Thursday, January 15th at 3:00pm.  Told patient to arrive at 2:45 pm.  Told pt to ask about the care card which is our financial assistance program.  Also told pt that they will be required to do a financial screening and that they will receive a phone call from a company named [employer].  Advised them that they must talk to this company in order to qualify for the care card so it is very important not to avoid this phone call.  Provided patient with practice name, address and date and time of appointment.  An After Visit Summary was printed and given to the patient. Sent pt to registration to schedule f/u appt for one month.   Patient verbalizes understanding of above information. Patient denies questions at time of discharge.  ABowles/RN

## 2013-07-09 NOTE — Progress Notes (Signed)
1. Have you been to the ER, urgent care clinic since your last visit?  Hospitalized since your last visit?No    2. Have you seen or consulted any other health care providers outside of the [facility] since your last visit?  Include any pap smears or colon screening. No  Results for orders placed in visit on 07/09/13   AMB POC GLUCOSE BLOOD, BY GLUCOSE MONITORING DEVICE       Result Value Range    Glucose POC 357       FASTING

## 2013-07-15 NOTE — Progress Notes (Signed)
Faxed to Morene RankinsKatherine Carmon at [phone] with the Doc RX/PAP program the following: [employer] [facility] PAP for Humalog 14 units three times a day. Signed by Dr. [name] for Dr. [name].Ewing SchleinKATHERINE H Keviana Guida, RN

## 2013-07-15 NOTE — Progress Notes (Signed)
Faxed to Morene RankinsKatherine Carmon at [phone] with the Doc RX/PAP program the following: Novonordisk PAP for Levemir vials 55 units sliding scale daily. Signed by Dr. [name] for Dr. [name]. Ewing SchleinKATHERINE H Denay Pleitez, RN
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
## 2013-07-17 NOTE — Telephone Encounter (Signed)
This Care-A-Van patient's Le[facility].  Routing this message to [facility] Dowell,RN.  Lynnell GrainJoyce R Skyelar Swigart, LPN
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
## 2013-07-25 NOTE — Telephone Encounter (Signed)
See note for 07/18/13.  Iantha FallenEBORAH W Tierney Behl, RN

## 2013-07-29 NOTE — Progress Notes (Signed)
Logged into logbook that pt has 6 vials of Humalog and 7 vials of Levemir on 07/18/12 and has appt on 08/06/13 with [name] Will send insulin then to clinic. Dr. [name], please let discharge nurse know if dosage is changed. Ewing SchleinKATHERINE H Acxel Dingee, RN

## 2013-07-31 NOTE — Progress Notes (Signed)
ok 

## 2013-08-06 LAB — AMB POC GLUCOSE BLOOD, BY GLUCOSE MONITORING DEVICE: Glucose POC: 341 mg/dL

## 2013-08-06 NOTE — Progress Notes (Signed)
HISTORY OF PRESENT ILLNESS  Alec Martin is a 53 y.o. male.  HPI Comments: Here for f/u of diabetes but can't stay as he has appt with DSS because his food stamps were stopped.  Taking levemir 60 units at hs now, and 16 units humalog with each meal.  Has written in log-- still has ppglucoses over 300 and sometimes 400 at hs; ac glucoses sometimes in 200s at lunch/supper now.    Hypertension     Diabetes        ROS    Physical Exam    ASSESSMENT and PLAN  Didn't have a chance to do anything else besides glance at his log.  He is to continue to increase the levemir by 1 unit each night that glucose is over 200 and continue humalog at 16 units with each meal.  I am adding metformin 500 mg daily, and I want him to see Dr. Laural BenesJohnson.  Will have nurse contact him for [facility] fin. Screen and appt with Dr. Laural BenesJohnson and also needs 2 week f/u to further assess glucoses and increase metformin if he tolerates it.

## 2013-08-06 NOTE — Progress Notes (Signed)
The following was performed at discharge station per provider:  Gave pt insulin from PAP.  Gave pt 6 vials of Humalog and 7 vials of Levemir.   Iantha FallenEBORAH W Yasin Ducat, RN

## 2013-08-06 NOTE — Progress Notes (Signed)
Pt picked up PAP insulin today.  Humalog 6 vials, Levemir 7 vials.  Iantha FallenEBORAH W Briton Sellman, RN

## 2013-08-07 NOTE — Telephone Encounter (Signed)
Caregiver, Cordelia Penthel, left msg that she needs to talk with somebody regarding his visit yesterday.  She understands that he is no longer allowed to come to [facility].  Please call her.  Also, we need to do something about getting her name on the template for calls so her name and phone number shows up as an option in addition to patient's name and phone number.    Alec BergamoBarbara Martin April

## 2013-08-07 NOTE — Telephone Encounter (Signed)
RN returned phone call and left message that pt will be seeing Desma MaximLisa Krieg, FNP on 08/20/13 @ 1:15PM.  Also left message regarding financial screening process at [facility].  RN advised that support letter forms are being sent to pt and will need to be completed by both sisters, notarized, and brought to the financial screening appt on 08/20/13.  Iantha FallenEBORAH W Tyrell Seifer, RN

## 2013-08-07 NOTE — Telephone Encounter (Signed)
Alec Martin returned Alec Martin's call and said 2/17 appointment at 1:15 with Dr. Illene Martin is great.    Alec Martin

## 2013-08-07 NOTE — Telephone Encounter (Signed)
Alec Martin said there are still outstanding issues.  [name] was working on this and thought the matter was settled.  Will somebody please call Alec Martin again.    [name]

## 2013-08-07 NOTE — Telephone Encounter (Signed)
RN returned call to pt's sister, Eldred Mangesthel Smith, (on [place]), who stated that pt lives one week with her and the next week with their sister.  She stated that they are trying to help pt take his medications accurately as he doesn't always seem to remember what the provider tells him.  RN reviewed with Cordelia PenEthel Dr. Sharrie RothmanEddy's instructions regarding his insulin, per Dr. Sharrie RothmanEddy's note from visit on 08/06/13.  RN also scheduled [facility] financial screening appt for 08/20/13 @ 8:45AM.  THe F/S appt for 08/20/13 @ 10:30AM was cancelled as the sister stated that the earlier time was preferred due to transportation issues.  RN also scheduled an appt for follow up with appt with Desma MaximLisa Krieg, FNP, on 08/20/13 @ 1:15PM.  Pt also has a [facility] appt scheduled for 08/20/13 @ 4:30PM.  Cordelia Penthel advised that they will not be able to provide transportation for pt to that appt and it should be cancelled.  RN called pt to discuss all above information with him.  RN gave him the dates and times of the new appts and explained [facility] financial screening process.  Pt stated that he is not working and is not getting food stamps any longer.  His sisters are providing for his financial needs.  RN explained to pt that financial support letters would be mailed to him for the sisters to complete and have notarized and are to be taken to [facility] for financial screening.  Pt stated that he does not want to cancel the [facility] study and thinks his nephew will be able to take him.  RN advised him to call the [facility] office if this needs to be cancelled.   Pt expressed understanding of the above information.  Brooks Sailorseborah Amariah Kierstead,RN.

## 2013-08-08 NOTE — Telephone Encounter (Signed)
error 

## 2013-08-08 NOTE — Telephone Encounter (Signed)
I returned her phone call and she wants it noted that the pt will not have a ride to the sleep center on the date scheduled. I called Theresia MajorsDebbie Bradley who talked at length yesterday to pt and he wants to keep this appointment. After talking with [name] and knowing that [facility] is [facility] on that same day for follow up, I am leaving the appt as scheduled since pt has not called us to cancel. Ewing SchleinKATHERINE H Jet Armbrust, RN

## 2013-08-30 NOTE — Progress Notes (Signed)
A user error has taken place: encounter opened in error, closed for administrative reasons.
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
## 2013-09-30 NOTE — Telephone Encounter (Signed)
3/30 - Again we placed another f/u call to patient's cell and his sibling phone who is on the HIPPA.  (sa)
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
## 2014-01-17 ENCOUNTER — Encounter (HOSPITAL_COMMUNITY): Payer: Self-pay | Admitting: Emergency Medicine

## 2014-01-17 ENCOUNTER — Emergency Department (HOSPITAL_COMMUNITY)
Admission: EM | Admit: 2014-01-17 | Discharge: 2014-01-17 | Disposition: A | Discharge status: Home / Self Care | Payer: BC Managed Care – PPO | Attending: Emergency Medicine | Admitting: Emergency Medicine

## 2014-01-17 ENCOUNTER — Emergency Department (HOSPITAL_COMMUNITY): Payer: BC Managed Care – PPO

## 2014-01-17 DIAGNOSIS — R109 Unspecified abdominal pain: Secondary | ICD-10-CM | POA: Insufficient documentation

## 2014-01-17 DIAGNOSIS — Z9981 Dependence on supplemental oxygen: Secondary | ICD-10-CM | POA: Insufficient documentation

## 2014-01-17 DIAGNOSIS — G4730 Sleep apnea, unspecified: Secondary | ICD-10-CM | POA: Insufficient documentation

## 2014-01-17 DIAGNOSIS — Z79899 Other long term (current) drug therapy: Secondary | ICD-10-CM | POA: Insufficient documentation

## 2014-01-17 DIAGNOSIS — I10 Essential (primary) hypertension: Secondary | ICD-10-CM | POA: Insufficient documentation

## 2014-01-17 DIAGNOSIS — E119 Type 2 diabetes mellitus without complications: Secondary | ICD-10-CM | POA: Insufficient documentation

## 2014-01-17 DIAGNOSIS — M129 Arthropathy, unspecified: Secondary | ICD-10-CM | POA: Insufficient documentation

## 2014-01-17 DIAGNOSIS — I252 Old myocardial infarction: Secondary | ICD-10-CM | POA: Insufficient documentation

## 2014-01-17 DIAGNOSIS — Z7982 Long term (current) use of aspirin: Secondary | ICD-10-CM | POA: Insufficient documentation

## 2014-01-17 DIAGNOSIS — R739 Hyperglycemia, unspecified: Secondary | ICD-10-CM

## 2014-01-17 DIAGNOSIS — E785 Hyperlipidemia, unspecified: Secondary | ICD-10-CM | POA: Insufficient documentation

## 2014-01-17 DIAGNOSIS — Z794 Long term (current) use of insulin: Secondary | ICD-10-CM | POA: Insufficient documentation

## 2014-01-17 HISTORY — DX: Acute myocardial infarction, unspecified: I21.9

## 2014-01-17 LAB — BASIC METABOLIC PANEL
Anion gap: 13 (ref 5–15)
BUN: 12 mg/dL (ref 6–23)
CO2: 24 mEq/L (ref 19–32)
Calcium: 9.2 mg/dL (ref 8.4–10.5)
Chloride: 97 mEq/L (ref 96–112)
Creatinine, Ser: 1.09 mg/dL (ref 0.50–1.35)
GFR calc Af Amer: 88 mL/min — ABNORMAL LOW (ref >90)
GFR calc non Af Amer: 76 mL/min — ABNORMAL LOW (ref >90)
Glucose, Bld: 314 mg/dL — ABNORMAL HIGH (ref 70–99)
Potassium: 4.1 mEq/L (ref 3.7–5.3)
Sodium: 134 mEq/L — ABNORMAL LOW (ref 137–147)

## 2014-01-17 LAB — CBC
HCT: 47.4 % (ref 39.0–52.0)
Hemoglobin: 16.4 g/dL (ref 13.0–17.0)
MCH: 30.8 pg (ref 26.0–34.0)
MCHC: 34.6 g/dL (ref 30.0–36.0)
MCV: 88.9 fL (ref 78.0–100.0)
Platelets: 212 K/uL (ref 150–400)
RBC: 5.33 MIL/uL (ref 4.22–5.81)
RDW: 12.8 % (ref 11.5–15.5)
WBC: 6.7 K/uL (ref 4.0–10.5)

## 2014-01-17 LAB — I-STAT TROPONIN, ED: Troponin i, poc: 0.02 ng/mL (ref 0.00–0.08)

## 2014-01-17 LAB — CBG MONITORING, ED: Glucose-Capillary: 268 mg/dL — ABNORMAL HIGH (ref 70–99)

## 2014-01-17 MED ORDER — GI COCKTAIL ~~LOC~~
30.0000 mL | Freq: Once | ORAL | Status: AC
  Administered 2014-01-17: 30 mL via ORAL
  Filled 2014-01-17: qty 30

## 2014-01-17 MED ORDER — PROMETHAZINE HCL 25 MG PO TABS: 25.0000 mg | ORAL_TABLET | Freq: Four times a day (QID) | ORAL | Status: DC | PRN

## 2014-01-17 MED ORDER — OMEPRAZOLE 20 MG PO CPDR: 20.0000 mg | DELAYED_RELEASE_CAPSULE | Freq: Every day | ORAL | Status: DC

## 2014-01-17 MED ORDER — KETOROLAC TROMETHAMINE 30 MG/ML IJ SOLN
30.0000 mg | Freq: Once | INTRAMUSCULAR | Status: AC
  Administered 2014-01-17: 30 mg via INTRAVENOUS
  Filled 2014-01-17: qty 1

## 2014-01-17 MED ORDER — SODIUM CHLORIDE 0.9 % IV SOLN
1000.0000 mL | INTRAVENOUS | Status: DC
  Administered 2014-01-17: 1000 mL via INTRAVENOUS

## 2014-01-17 MED ORDER — MORPHINE SULFATE 4 MG/ML IJ SOLN
6.0000 mg | Freq: Once | INTRAMUSCULAR | Status: AC
  Administered 2014-01-17: 6 mg via INTRAVENOUS
  Filled 2014-01-17: qty 2

## 2014-01-17 MED ORDER — SODIUM CHLORIDE 0.9 % IV SOLN
1000.0000 mL | Freq: Once | INTRAVENOUS | Status: AC
  Administered 2014-01-17: 1000 mL via INTRAVENOUS

## 2014-01-17 NOTE — ED Notes (Signed)
PT ambulated with baseline gait; VSS; A&Ox3; no signs of distress; respirations even and unlabored; skin warm and dry; no questions upon discharge.  

## 2014-01-17 NOTE — Discharge Instructions (Signed)

## 2014-01-17 NOTE — ED Notes (Signed)
Patient transported to X-ray without distress.  

## 2014-01-17 NOTE — ED Notes (Addendum)
He states "my blood sugar was 450 at home and im cold and dizzy and my head  And chest hurts.' hes A&Ox4, resp e/u

## 2014-01-17 NOTE — ED Provider Notes (Signed)
CSN: [id number]     Arrival date & time 01/17/14  1809 History   First MD Initiated Contact with Patient 01/17/14 1935     Chief Complaint  Patient presents with  . Hyperglycemia      HPI Patient presents complaining of elevated blood sugars.  The patient states he is diabetic and compliant with his medications.  He's had some mild upper abdominal discomfort.  He does report some mild chest discomfort which has been constant over the past 24 hours.  Pain is worsened by movement and palpation of his chest.  No shortness of breath.  No diaphoresis.  No history of cardiac disease.  Denies active discomfort at this time.  No urinary complaints.  No diarrhea.  Denies focal abdominal discomfort.   Past Medical History  Diagnosis Date  . Diabetes mellitus   . Hypertension   . Arthritis   . Hyperlipidemia   . Sleep apnea     cpap  . Heart attack    Past Surgical History  Procedure Laterality Date  . Forearm surgery    . Muscle biopsy    . Colonoscopy  05/13/11    9 adenomas   Family History  Problem Relation Age of Onset  . Heart disease Mother   . Heart disease Father   . Colon cancer Neg Hx   . Stomach cancer Neg Hx    History  Substance Use Topics  . Smoking status: Never Smoker   . Smokeless tobacco: Not on file  . Alcohol Use: No    Review of Systems  All other systems reviewed and are negative.     Allergies  Review of patient's allergies indicates no known allergies.  Home Medications   Prior to Admission medications   Medication Sig Start Date End Date Taking? Authorizing Provider  albuterol (PROVENTIL HFA;VENTOLIN HFA) 108 (90 BASE) MCG/ACT inhaler Inhale 2 puffs into the lungs every 6 (six) hours as needed for wheezing.   Yes Historical Provider, MD  amitriptyline (ELAVIL) 25 MG tablet Take 25 mg by mouth at bedtime.   Yes Historical Provider, MD  aspirin EC 81 MG tablet Take 81 mg by mouth daily.   Yes Historical Provider, MD  atorvastatin (LIPITOR) 40 MG  tablet Take 40 mg by mouth daily.    Yes Historical Provider, MD  calamine lotion Apply 1 application topically as needed for itching.   Yes Historical Provider, MD  glucose blood test strip 1 each by Other route See admin instructions. Check blood sugar 3 times daily.   Yes Historical Provider, MD  insulin aspart (NOVOLOG) 100 UNIT/ML injection Inject 30-45 Units into the skin 3 (three) times daily before meals. Per sliding scale   Yes Historical Provider, MD  insulin detemir (LEVEMIR) 100 UNIT/ML injection Inject into the skin 2 (two) times daily.   Yes Historical Provider, MD  nitroGLYCERIN (NITROSTAT) 0.4 MG SL tablet Place 0.4 mg under the tongue every 5 (five) minutes as needed for chest pain.   Yes Historical Provider, MD   BP 141/74  Pulse 54  Temp(Src) 97.6 F (36.4 C) (Oral)  Resp 14  Ht 5' 5"  (1.651 m)  Wt 195 lb (88.451 kg)  BMI 32.45 kg/m2  SpO2 100% Physical Exam  Nursing note and vitals reviewed. Constitutional: He is oriented to person, place, and time. He appears well-developed and well-nourished.  HENT:  Head: Normocephalic and atraumatic.  Eyes: EOM are normal.  Neck: Normal range of motion.  Cardiovascular: Normal rate, regular rhythm, normal  heart sounds and intact distal pulses.   Pulmonary/Chest: Effort normal and breath sounds normal. No respiratory distress.  Abdominal: Soft. He exhibits no distension. There is no tenderness.  Musculoskeletal: Normal range of motion.  Neurological: He is alert and oriented to person, place, and time.  Skin: Skin is warm and dry.  Psychiatric: He has a normal mood and affect. Judgment normal.    ED Course  Procedures  Labs Review Labs Reviewed  BASIC METABOLIC PANEL - Abnormal; Notable for the following:    Sodium 134 (*)    Glucose, Bld 314 (*)    GFR calc non Af Amer 76 (*)    GFR calc Af Amer 88 (*)    All other components within normal limits  CBG MONITORING, ED - Abnormal; Notable for the following:     Glucose-Capillary 268 (*)    All other components within normal limits  CBC  I-STAT TROPOININ, ED  CBG MONITORING, ED    Imaging Review Dg Chest 2 View  01/17/2014   CLINICAL DATA:  Hyperglycemia. Diabetes. Hypertension. Sleep apnea. Previous myocardial infarct.  EXAM: CHEST  2 VIEW  COMPARISON:  11/28/2012  FINDINGS: The heart size and mediastinal contours are within normal limits. Both lungs are clear. The visualized skeletal structures are unremarkable.  IMPRESSION: No active cardiopulmonary disease.   Electronically Signed   By: Earle Gell M.D.   On: 01/17/2014 21:02  I personally reviewed the imaging tests through PACS system I reviewed available ER/hospitalization records through the EMR     EKG Interpretation   Date/Time:  Friday January 17 2014 18:30:21 EDT Ventricular Rate:  87 PR Interval:  182 QRS Duration: 88 QT Interval:  364 QTC Calculation: 438 R Axis:   4 Text Interpretation:  Normal sinus rhythm Nonspecific T wave abnormality  Abnormal ECG No significant change was found Confirmed by Reigna Ruperto  MD,  Lennette Bihari ([id number]) on 01/17/2014 8:26:17 PM      MDM   Final diagnoses:  Hyperglycemia  Abdominal pain, unspecified abdominal location   Patient is feeling better in emergency apartment this time.  His abdominal exam is benign.  Labs, EKG, troponin, chest x-ray without abnormality.  Doubt ACS.  Doubt significant intra-abdominal issues.  Patient will need to followup with his primary care physician.  Vas that the patient return to the ER for any new or worsening symptoms.  Vital signs stable.  Patient currently has lost his insurance and no longer follows with Dr. Brigitte Pulse.  He has an orange card and is working at closely following up with his physicians at the [place], MD 01/17/14 2215

## 2014-06-16 ENCOUNTER — Encounter: Payer: Self-pay | Admitting: Internal Medicine

## 2014-08-28 NOTE — Progress Notes (Signed)
Computer down. See scanned in office note

## 2014-10-07 ENCOUNTER — Encounter: Payer: Self-pay | Admitting: Internal Medicine

## 2014-10-09 ENCOUNTER — Ambulatory Visit: Payer: Self-pay

## 2014-12-19 ENCOUNTER — Encounter (HOSPITAL_COMMUNITY): Payer: Self-pay | Admitting: *Deleted

## 2014-12-19 ENCOUNTER — Emergency Department (HOSPITAL_COMMUNITY)
Admission: EM | Admit: 2014-12-19 | Discharge: 2014-12-19 | Disposition: A | Discharge status: Home / Self Care | Payer: Self-pay | Attending: Emergency Medicine | Admitting: Emergency Medicine

## 2014-12-19 DIAGNOSIS — R739 Hyperglycemia, unspecified: Secondary | ICD-10-CM

## 2014-12-19 DIAGNOSIS — M1990 Unspecified osteoarthritis, unspecified site: Secondary | ICD-10-CM | POA: Insufficient documentation

## 2014-12-19 DIAGNOSIS — E1165 Type 2 diabetes mellitus with hyperglycemia: Secondary | ICD-10-CM | POA: Insufficient documentation

## 2014-12-19 DIAGNOSIS — E785 Hyperlipidemia, unspecified: Secondary | ICD-10-CM | POA: Insufficient documentation

## 2014-12-19 DIAGNOSIS — L02416 Cutaneous abscess of left lower limb: Secondary | ICD-10-CM | POA: Insufficient documentation

## 2014-12-19 DIAGNOSIS — Z794 Long term (current) use of insulin: Secondary | ICD-10-CM | POA: Insufficient documentation

## 2014-12-19 DIAGNOSIS — L0291 Cutaneous abscess, unspecified: Secondary | ICD-10-CM

## 2014-12-19 DIAGNOSIS — Z7982 Long term (current) use of aspirin: Secondary | ICD-10-CM | POA: Insufficient documentation

## 2014-12-19 DIAGNOSIS — Z8674 Personal history of sudden cardiac arrest: Secondary | ICD-10-CM | POA: Insufficient documentation

## 2014-12-19 DIAGNOSIS — I10 Essential (primary) hypertension: Secondary | ICD-10-CM | POA: Insufficient documentation

## 2014-12-19 DIAGNOSIS — Z79899 Other long term (current) drug therapy: Secondary | ICD-10-CM | POA: Insufficient documentation

## 2014-12-19 DIAGNOSIS — G4730 Sleep apnea, unspecified: Secondary | ICD-10-CM | POA: Insufficient documentation

## 2014-12-19 DIAGNOSIS — Z9981 Dependence on supplemental oxygen: Secondary | ICD-10-CM | POA: Insufficient documentation

## 2014-12-19 LAB — CBC WITH DIFFERENTIAL/PLATELET
Basophils Absolute: 0 10*3/uL (ref 0.0–0.1)
Basophils Relative: 0 % (ref 0–1)
Eosinophils Absolute: 0.2 10*3/uL (ref 0.0–0.7)
Eosinophils Relative: 3 % (ref 0–5)
HCT: 45.5 % (ref 39.0–52.0)
Hemoglobin: 15.8 g/dL (ref 13.0–17.0)
Lymphocytes Relative: 41 % (ref 12–46)
Lymphs Abs: 2.6 10*3/uL (ref 0.7–4.0)
MCH: 30.8 pg (ref 26.0–34.0)
MCHC: 34.7 g/dL (ref 30.0–36.0)
MCV: 88.7 fL (ref 78.0–100.0)
Monocytes Absolute: 0.3 10*3/uL (ref 0.1–1.0)
Monocytes Relative: 5 % (ref 3–12)
Neutro Abs: 3.3 10*3/uL (ref 1.7–7.7)
Neutrophils Relative %: 51 % (ref 43–77)
Platelets: 198 10*3/uL (ref 150–400)
RBC: 5.13 MIL/uL (ref 4.22–5.81)
RDW: 12.1 % (ref 11.5–15.5)
WBC: 6.4 10*3/uL (ref 4.0–10.5)

## 2014-12-19 LAB — BASIC METABOLIC PANEL
Anion gap: 5 (ref 5–15)
BUN: 11 mg/dL (ref 6–20)
CO2: 28 mmol/L (ref 22–32)
Calcium: 9 mg/dL (ref 8.9–10.3)
Chloride: 100 mmol/L — ABNORMAL LOW (ref 101–111)
Creatinine, Ser: 1.16 mg/dL (ref 0.61–1.24)
GFR calc Af Amer: >60 mL/min (ref >60)
GFR calc non Af Amer: >60 mL/min (ref >60)
Glucose, Bld: 378 mg/dL — ABNORMAL HIGH (ref 65–99)
Potassium: 4 mmol/L (ref 3.5–5.1)
Sodium: 133 mmol/L — ABNORMAL LOW (ref 135–145)

## 2014-12-19 LAB — CBG MONITORING, ED: Glucose-Capillary: 465 mg/dL — ABNORMAL HIGH (ref 65–99)

## 2014-12-19 MED ORDER — SODIUM CHLORIDE 0.9 % IV BOLUS (SEPSIS): 1000.0000 mL | INTRAVENOUS | Status: DC

## 2014-12-19 MED ORDER — CEPHALEXIN 500 MG PO CAPS: 500.0000 mg | ORAL_CAPSULE | Freq: Four times a day (QID) | ORAL | Status: DC

## 2014-12-19 MED ORDER — BACITRACIN ZINC 500 UNIT/GM EX OINT
1.0000 | TOPICAL_OINTMENT | Freq: Two times a day (BID) | CUTANEOUS | Status: DC
Start: 2014-12-19 — End: 2015-11-22

## 2014-12-19 MED ORDER — BACITRACIN ZINC 500 UNIT/GM EX OINT
TOPICAL_OINTMENT | CUTANEOUS | Status: AC
  Administered 2014-12-19: 1
  Filled 2014-12-19: qty 0.9

## 2014-12-19 NOTE — ED Notes (Signed)
MD at bedside. 

## 2014-12-19 NOTE — ED Provider Notes (Signed)
CSN: [id number]     Arrival date & time 12/19/14  6546 History   First MD Initiated Contact with Patient 12/19/14 2211     Chief Complaint  Patient presents with  . Abscess   (Consider location/radiation/quality/duration/timing/severity/associated sxs/prior Treatment) HPI   Calvin [name] is a 54 year old male with history of uncontrolled insulin-dependent diabetes, hypertension, hyperlipidemia, who presents to the emergency department tonight with a sore on his left shin which has been there for approximately one week.  There was no trauma, and there was no pain, he just noticed that there was a bump on his leg, which then started draining spontaneously. The drainage was initially purulent, but now is mostly clear.  He describes it as an abscess that was initially coming to a head, but he has not treated it with anything and has not bandaged it at all.  It does not cause him any pain unless directly touching it.  He has not had any streaking redness, spreading edema, fever, chills, sweats. He cannot tell me any of his daily medicines, and regularly has sugars in the 500-600.  He says he is frequently thirsty and urinates a lot, however does not feel dehydrated, is not lightheaded or weak and does not have any other constitutional symptoms or complaints at this time.  Past Medical History  Diagnosis Date  . Diabetes mellitus   . Hypertension   . Arthritis   . Hyperlipidemia   . Sleep apnea     cpap  . Heart attack    Past Surgical History  Procedure Laterality Date  . Forearm surgery    . Muscle biopsy    . Colonoscopy  05/13/11    9 adenomas   Family History  Problem Relation Age of Onset  . Heart disease Mother   . Heart disease Father   . Colon cancer Neg Hx   . Stomach cancer Neg Hx    History  Substance Use Topics  . Smoking status: Never Smoker   . Smokeless tobacco: Not on file  . Alcohol Use: No    Review of Systems  All other systems reviewed and are  negative.  Allergies  Review of patient's allergies indicates no known allergies.  Home Medications   Prior to Admission medications   Medication Sig Start Date End Date Taking? Authorizing Provider  amitriptyline (ELAVIL) 25 MG tablet Take 25 mg by mouth at bedtime.   Yes Historical Provider, MD  aspirin EC 81 MG tablet Take 81 mg by mouth daily.   Yes Historical Provider, MD  atorvastatin (LIPITOR) 40 MG tablet Take 40 mg by mouth daily.    Yes Historical Provider, MD  glucose blood test strip 1 each by Other route See admin instructions. Check blood sugar 3 times daily.   Yes Historical Provider, MD  insulin aspart (NOVOLOG) 100 UNIT/ML injection Inject 30-45 Units into the skin 3 (three) times daily before meals. Per sliding scale   Yes Historical Provider, MD  insulin detemir (LEVEMIR) 100 UNIT/ML injection Inject into the skin 2 (two) times daily.   Yes Historical Provider, MD  omeprazole (PRILOSEC) 20 MG capsule Take 1 capsule (20 mg total) by mouth daily. 01/17/14  Yes Jola Schmidt, MD  albuterol (PROVENTIL HFA;VENTOLIN HFA) 108 (90 BASE) MCG/ACT inhaler Inhale 2 puffs into the lungs every 6 (six) hours as needed for wheezing.    Historical Provider, MD  bacitracin ointment Apply 1 application topically 2 (two) times daily. 12/19/14   Delsa Grana, [name]  cephALEXin (KEFLEX)  500 MG capsule Take 1 capsule (500 mg total) by mouth 4 (four) times daily. 12/19/14   Delsa Grana, [name]  nitroGLYCERIN (NITROSTAT) 0.4 MG SL tablet Place 0.4 mg under the tongue every 5 (five) minutes as needed for chest pain.    Historical Provider, MD  promethazine (PHENERGAN) 25 MG tablet Take 1 tablet (25 mg total) by mouth every 6 (six) hours as needed for nausea or vomiting. Patient not taking: Reported on 12/19/2014 01/17/14   Jola Schmidt, MD   BP 146/90 mmHg  Pulse 89  Temp(Src) 98.4 F (36.9 C) (Oral)  Resp 17  SpO2 99% Physical Exam  Constitutional: He is oriented to person, place, and time. He appears  well-developed and well-nourished. No distress.  HENT:  Head: Normocephalic and atraumatic.  Right Ear: External ear normal.  Left Ear: External ear normal.  Nose: Nose normal.  Mouth/Throat: Uvula is midline, oropharynx is clear and moist and mucous membranes are normal. Mucous membranes are not pale, not dry and not cyanotic. No posterior oropharyngeal edema.  Eyes: Conjunctivae and EOM are normal. Pupils are equal, round, and reactive to light. Right eye exhibits no discharge. Left eye exhibits no discharge. No scleral icterus.  Neck: Normal range of motion. No JVD present. No tracheal deviation present.  Cardiovascular: Normal rate, regular rhythm and normal heart sounds.  Exam reveals no gallop and no friction rub.   No murmur heard. Pulmonary/Chest: Effort normal and breath sounds normal. No accessory muscle usage. No tachypnea. No respiratory distress. He has no decreased breath sounds. He has no wheezes. He has no rhonchi. He has no rales. He exhibits no tenderness.  Musculoskeletal: Normal range of motion. He exhibits no edema or tenderness.  Neurological: He is alert and oriented to person, place, and time. He exhibits normal muscle tone. Coordination normal.  Skin: Skin is warm and dry. Lesion noted. No rash noted. He is not diaphoretic. No cyanosis or erythema. No pallor. Nails show no clubbing.     Psychiatric: He has a normal mood and affect. His behavior is normal. Judgment and thought content normal.   ED Course  Procedures (including critical care time) Labs Review Labs Reviewed  BASIC METABOLIC PANEL - Abnormal; Notable for the following:    Sodium 133 (*)    Chloride 100 (*)    Glucose, Bld 378 (*)    All other components within normal limits  CBG MONITORING, ED - Abnormal; Notable for the following:    Glucose-Capillary 465 (*)    All other components within normal limits  CBC WITH DIFFERENTIAL/PLATELET   Imaging Review No results found.   EKG  Interpretation None      MDM   Final diagnoses:  Hyperglycemia  Abscess    Patient is a 54 year old male with uncontrolled insulin-dependent diabetes, presents with a sore on his left lower extremity. There was no trauma, he just noticed it about a week ago, it was bulging and then spontaneously drained.  She denies any fever, chills, nausea, vomiting, diarrhea.  He is afebrile here in the ER.  CBG is 465, patient states his sugars regularly ran 500-600 and he sees Dr. Brigitte Pulse, who he has seen recently.  Vision does not want workup at this time for hyperglycemia.  He appears well-hydrated, and is not ill-appearing.  We will give him a prescription of abx to cover for any future infectious process, however, exam and labs are not concerning for infection or cellulitis.  Pt says he is going out of town  tomorrow, he was given return precautions for infection and DKA, pt verbalizes understanding.  Pt clear to discharge at this time.  Dr. Aline Brochure has also seen the pt, and is in agreement with this plan, which is inline with the patients wishes.   Medications  bacitracin 500 UNIT/GM ointment (1 application  Given 7/39/58 2354)   Filed Vitals:   12/19/14 1918 12/19/14 2354  BP: 137/72 146/90  Pulse: 100 89  Temp: 98.5 F (36.9 C) 98.4 F (36.9 C)  TempSrc: Oral Oral  Resp: 20 17  SpO2: 100% 99%         Delsa Grana, [name] 12/20/14 0105  Pamella Pert, MD 12/20/14 1004

## 2014-12-19 NOTE — Discharge Instructions (Signed)
Cellulitis Cellulitis is an infection of the skin and the tissue under the skin. The infected area is usually red and tender. This happens most often in the arms and lower legs. HOME CARE   Take your antibiotic medicine as told. Finish the medicine even if you start to feel better.  Keep the infected arm or leg raised (elevated).  Put a warm cloth on the area up to 4 times per day.  Only take medicines as told by your doctor.  Keep all doctor visits as told. GET HELP IF:  You see red streaks on the skin coming from the infected area.  Your red area gets bigger or turns a dark color.  Your bone or joint under the infected area is painful after the skin heals.  Your infection comes back in the same area or different area.  You have a puffy (swollen) bump in the infected area.  You have new symptoms.  You have a fever. GET HELP RIGHT AWAY IF:   You feel very sleepy.  You throw up (vomit) or have watery poop (diarrhea).  You feel sick and have muscle aches and pains. MAKE SURE YOU:   Understand these instructions.  Will watch your condition.  Will get help right away if you are not doing well or get worse. Document Released: 12/07/2007 Document Revised: 11/04/2013 Document Reviewed: 09/05/2011 [facility] Patient Information 2015 [place], [place]. This information is not intended to replace advice given to you by your health care provider. Make sure you discuss any questions you have with your health care provider.  Blood Glucose Monitoring Monitoring your blood glucose (also know as blood sugar) helps you to manage your diabetes. It also helps you and your health care provider monitor your diabetes and determine how well your treatment plan is working. WHY SHOULD YOU MONITOR YOUR BLOOD GLUCOSE?  It can help you understand how food, exercise, and medicine affect your blood glucose.  It allows you to know what your blood glucose is at any given moment. You can quickly tell  if you are having low blood glucose (hypoglycemia) or high blood glucose (hyperglycemia).  It can help you and your health care provider know how to adjust your medicines.  It can help you understand how to manage an illness or adjust medicine for exercise. WHEN SHOULD YOU TEST? Your health care provider will help you decide how often you should check your blood glucose. This may depend on the type of diabetes you have, your diabetes control, or the types of medicines you are taking. Be sure to write down all of your blood glucose readings so that this information can be reviewed with your health care provider. See below for examples of testing times that your health care provider may suggest. Type 1 Diabetes  Test 4 times a day if you are in good control, using an insulin pump, or perform multiple daily injections.  If your diabetes is not well controlled or if you are sick, you may need to monitor more often.  It is a good idea to also monitor:  Before and after exercise.  Between meals and 2 hours after a meal.  Occasionally between 2:00 a.m. and 3:00 a.m. Type 2 Diabetes  It can vary with each person, but generally, if you are on insulin, test 4 times a day.  If you take medicines by mouth (orally), test 2 times a day.  If you are on a controlled diet, test once a day.  If your diabetes  is not well controlled or if you are sick, you may need to monitor more often. HOW TO MONITOR YOUR BLOOD GLUCOSE Supplies Needed  Blood glucose meter.  Test strips for your meter. Each meter has its own strips. You must use the strips that go with your own meter.  A pricking needle (lancet).  A device that holds the lancet (lancing device).  A journal or log book to write down your results. Procedure  Wash your hands with soap and water. Alcohol is not preferred.  Prick the side of your finger (not the tip) with the lancet.  Gently milk the finger until a small drop of blood  appears.  Follow the instructions that come with your meter for inserting the test strip, applying blood to the strip, and using your blood glucose meter. Other Areas to Get Blood for Testing Some meters allow you to use other areas of your body (other than your finger) to test your blood. These areas are called alternative sites. The most common alternative sites are:  The forearm.  The thigh.  The back area of the lower leg.  The palm of the hand. The blood flow in these areas is slower. Therefore, the blood glucose values you get may be delayed, and the numbers are different from what you would get from your fingers. Do not use alternative sites if you think you are having hypoglycemia. Your reading will not be accurate. Always use a finger if you are having hypoglycemia. Also, if you cannot feel your lows (hypoglycemia unawareness), always use your fingers for your blood glucose checks. ADDITIONAL TIPS FOR GLUCOSE MONITORING  Do not reuse lancets.  Always carry your supplies with you.  All blood glucose meters have a 24-hour "hotline" number to call if you have questions or need help.  Adjust (calibrate) your blood glucose meter with a control solution after finishing a few boxes of strips. BLOOD GLUCOSE RECORD KEEPING It is a good idea to keep a daily record or log of your blood glucose readings. Most glucose meters, if not all, keep your glucose records stored in the meter. Some meters come with the ability to download your records to your home computer. Keeping a record of your blood glucose readings is especially helpful if you are wanting to look for patterns. Make notes to go along with the blood glucose readings because you might forget what happened at that exact time. Keeping good records helps you and your health care provider to work together to achieve good diabetes management.  Document Released: 06/23/2003 Document Revised: 11/04/2013 Document Reviewed:  11/12/2012 [facility] Patient Information 2015 [place], [place]. This information is not intended to replace advice given to you by your health care provider. Make sure you discuss any questions you have with your health care provider.

## 2014-12-19 NOTE — ED Notes (Signed)
Pt states that he has had a wound / abscess to his left lower leg x 1 week; pt states that his MD advised to get it evaluated today bc she could not see him; pt is a diabetic and is concerned over wound; area is reddened with drainage

## 2015-04-16 ENCOUNTER — Encounter (HOSPITAL_COMMUNITY): Payer: Self-pay | Admitting: Emergency Medicine

## 2015-04-16 ENCOUNTER — Emergency Department (HOSPITAL_COMMUNITY)
Admission: EM | Admit: 2015-04-16 | Discharge: 2015-04-16 | Disposition: A | Discharge status: Home / Self Care | Payer: Self-pay | Attending: Emergency Medicine | Admitting: Emergency Medicine

## 2015-04-16 ENCOUNTER — Emergency Department (HOSPITAL_COMMUNITY): Payer: Self-pay

## 2015-04-16 DIAGNOSIS — Z7982 Long term (current) use of aspirin: Secondary | ICD-10-CM | POA: Insufficient documentation

## 2015-04-16 DIAGNOSIS — E785 Hyperlipidemia, unspecified: Secondary | ICD-10-CM | POA: Insufficient documentation

## 2015-04-16 DIAGNOSIS — I10 Essential (primary) hypertension: Secondary | ICD-10-CM | POA: Insufficient documentation

## 2015-04-16 DIAGNOSIS — G4730 Sleep apnea, unspecified: Secondary | ICD-10-CM | POA: Insufficient documentation

## 2015-04-16 DIAGNOSIS — Z9981 Dependence on supplemental oxygen: Secondary | ICD-10-CM | POA: Insufficient documentation

## 2015-04-16 DIAGNOSIS — I252 Old myocardial infarction: Secondary | ICD-10-CM | POA: Insufficient documentation

## 2015-04-16 DIAGNOSIS — Z79899 Other long term (current) drug therapy: Secondary | ICD-10-CM | POA: Insufficient documentation

## 2015-04-16 DIAGNOSIS — Z794 Long term (current) use of insulin: Secondary | ICD-10-CM | POA: Insufficient documentation

## 2015-04-16 DIAGNOSIS — L03211 Cellulitis of face: Secondary | ICD-10-CM | POA: Insufficient documentation

## 2015-04-16 DIAGNOSIS — Z792 Long term (current) use of antibiotics: Secondary | ICD-10-CM | POA: Insufficient documentation

## 2015-04-16 DIAGNOSIS — E119 Type 2 diabetes mellitus without complications: Secondary | ICD-10-CM | POA: Insufficient documentation

## 2015-04-16 LAB — BASIC METABOLIC PANEL
Anion gap: 10 (ref 5–15)
BUN: 7 mg/dL (ref 6–20)
CO2: 28 mmol/L (ref 22–32)
Calcium: 9.5 mg/dL (ref 8.9–10.3)
Chloride: 97 mmol/L — ABNORMAL LOW (ref 101–111)
Creatinine, Ser: 1.17 mg/dL (ref 0.61–1.24)
GFR calc Af Amer: >60 mL/min (ref >60)
GFR calc non Af Amer: >60 mL/min (ref >60)
Glucose, Bld: 304 mg/dL — ABNORMAL HIGH (ref 65–99)
Potassium: 4.1 mmol/L (ref 3.5–5.1)
Sodium: 135 mmol/L (ref 135–145)

## 2015-04-16 LAB — CBC
HCT: 45.2 % (ref 39.0–52.0)
Hemoglobin: 16 g/dL (ref 13.0–17.0)
MCH: 31.4 pg (ref 26.0–34.0)
MCHC: 35.4 g/dL (ref 30.0–36.0)
MCV: 88.8 fL (ref 78.0–100.0)
Platelets: 188 10*3/uL (ref 150–400)
RBC: 5.09 MIL/uL (ref 4.22–5.81)
RDW: 11.8 % (ref 11.5–15.5)
WBC: 11 10*3/uL — ABNORMAL HIGH (ref 4.0–10.5)

## 2015-04-16 LAB — CBG MONITORING, ED: Glucose-Capillary: 274 mg/dL — ABNORMAL HIGH (ref 65–99)

## 2015-04-16 MED ORDER — SULFAMETHOXAZOLE-TRIMETHOPRIM 800-160 MG PO TABS: 1.0000 | ORAL_TABLET | Freq: Two times a day (BID) | ORAL | Status: AC

## 2015-04-16 MED ORDER — SODIUM CHLORIDE 0.9 % IV BOLUS (SEPSIS)
1000.0000 mL | Freq: Once | INTRAVENOUS | Status: AC
  Administered 2015-04-16: 1000 mL via INTRAVENOUS

## 2015-04-16 MED ORDER — SULFAMETHOXAZOLE-TRIMETHOPRIM 800-160 MG PO TABS
1.0000 | ORAL_TABLET | Freq: Once | ORAL | Status: AC
  Administered 2015-04-16: 1 via ORAL
  Filled 2015-04-16: qty 1

## 2015-04-16 MED ORDER — CEPHALEXIN 250 MG PO CAPS
500.0000 mg | ORAL_CAPSULE | Freq: Once | ORAL | Status: AC
  Administered 2015-04-16: 500 mg via ORAL
  Filled 2015-04-16: qty 2

## 2015-04-16 MED ORDER — HYDROCODONE-ACETAMINOPHEN 5-325 MG PO TABS: 1.0000 | ORAL_TABLET | Freq: Four times a day (QID) | ORAL | Status: DC | PRN

## 2015-04-16 MED ORDER — IOHEXOL 300 MG/ML  SOLN
75.0000 mL | Freq: Once | INTRAMUSCULAR | Status: AC | PRN
  Administered 2015-04-16: 75 mL via INTRAVENOUS

## 2015-04-16 MED ORDER — CEPHALEXIN 500 MG PO CAPS: 500.0000 mg | ORAL_CAPSULE | Freq: Three times a day (TID) | ORAL | Status: DC

## 2015-04-16 MED ORDER — CLINDAMYCIN PHOSPHATE 600 MG/50ML IV SOLN
600.0000 mg | Freq: Once | INTRAVENOUS | Status: AC
  Administered 2015-04-16: 600 mg via INTRAVENOUS
  Filled 2015-04-16: qty 50

## 2015-04-16 NOTE — ED Notes (Signed)
Pt arrives POV from home with left sided facial swelling for the last day. Denies recent fever, cough, congestion, dental pain. Pt c/o headache, alert, oriented x4, VSS.

## 2015-04-16 NOTE — ED Provider Notes (Signed)
CSN: [id number]     Arrival date & time 04/16/15  1322 History   First MD Initiated Contact with Patient 04/16/15 1421     Chief Complaint  Patient presents with  . Facial Swelling     (Consider location/radiation/quality/duration/timing/severity/associated sxs/prior Treatment) Patient is a 54 y.o. male presenting with general illness. The history is provided by the patient.  Illness Location:  Left cheek Quality:  Pain, swelling Severity:  Moderate Onset quality:  Gradual Duration:  2 days Timing:  Constant Progression:  Unchanged Chronicity:  New Context:  Left cheek pain, began yesterday morning when awakening Relieved by:  Nothing Worsened by:  Nothing Associated symptoms: no cough, no diarrhea, no fever and no shortness of breath     Past Medical History  Diagnosis Date  . Diabetes mellitus   . Hypertension   . Arthritis   . Hyperlipidemia   . Sleep apnea     cpap  . Heart attack [facility])    Past Surgical History  Procedure Laterality Date  . Forearm surgery    . Muscle biopsy    . Colonoscopy  05/13/11    9 adenomas   Family History  Problem Relation Age of Onset  . Heart disease Mother   . Heart disease Father   . Colon cancer Neg Hx   . Stomach cancer Neg Hx    Social History  Substance Use Topics  . Smoking status: Never Smoker   . Smokeless tobacco: None  . Alcohol Use: No    Review of Systems  Constitutional: Negative for fever.  Respiratory: Negative for cough and shortness of breath.   Gastrointestinal: Negative for diarrhea.  All other systems reviewed and are negative.     Allergies  Review of patient's allergies indicates no known allergies.  Home Medications   Prior to Admission medications   Medication Sig Start Date End Date Taking? Authorizing Provider  albuterol (PROVENTIL HFA;VENTOLIN HFA) 108 (90 BASE) MCG/ACT inhaler Inhale 2 puffs into the lungs every 6 (six) hours as needed for wheezing.    Historical Provider, MD    amitriptyline (ELAVIL) 25 MG tablet Take 25 mg by mouth at bedtime.    Historical Provider, MD  aspirin EC 81 MG tablet Take 81 mg by mouth daily.    Historical Provider, MD  atorvastatin (LIPITOR) 40 MG tablet Take 40 mg by mouth daily.     Historical Provider, MD  bacitracin ointment Apply 1 application topically 2 (two) times daily. 12/19/14   Delsa Grana, [name]  cephALEXin (KEFLEX) 500 MG capsule Take 1 capsule (500 mg total) by mouth 4 (four) times daily. 12/19/14   Delsa Grana, [name]  glucose blood test strip 1 each by Other route See admin instructions. Check blood sugar 3 times daily.    Historical Provider, MD  insulin aspart (NOVOLOG) 100 UNIT/ML injection Inject 30-45 Units into the skin 3 (three) times daily before meals. Per sliding scale    Historical Provider, MD  insulin detemir (LEVEMIR) 100 UNIT/ML injection Inject into the skin 2 (two) times daily.    Historical Provider, MD  nitroGLYCERIN (NITROSTAT) 0.4 MG SL tablet Place 0.4 mg under the tongue every 5 (five) minutes as needed for chest pain.    Historical Provider, MD  omeprazole (PRILOSEC) 20 MG capsule Take 1 capsule (20 mg total) by mouth daily. 01/17/14   Jola Schmidt, MD  promethazine (PHENERGAN) 25 MG tablet Take 1 tablet (25 mg total) by mouth every 6 (six) hours as needed for nausea  or vomiting. Patient not taking: Reported on 12/19/2014 01/17/14   Jola Schmidt, MD   BP 135/80 mmHg  Pulse 87  Temp(Src) 98.3 F (36.8 C) (Oral)  Resp 18  SpO2 100% Physical Exam  Constitutional: He is oriented to person, place, and time. He appears well-developed and well-nourished. No distress.  HENT:  Head: Normocephalic and atraumatic.    Mouth/Throat: Oropharynx is clear and moist. No oropharyngeal exudate.  Eyes: EOM are normal. Pupils are equal, round, and reactive to light.  Neck: Normal range of motion. Neck supple.  Cardiovascular: Normal rate and regular rhythm.  Exam reveals no friction rub.   No murmur  heard. Pulmonary/Chest: Effort normal and breath sounds normal. No respiratory distress. He has no wheezes. He has no rales.  Abdominal: Soft. He exhibits no distension. There is no tenderness. There is no rebound.  Musculoskeletal: Normal range of motion. He exhibits no edema.  Neurological: He is alert and oriented to person, place, and time.  Skin: No rash noted. He is not diaphoretic.  Nursing note and vitals reviewed.   ED Course  Procedures (including critical care time) Labs Review Labs Reviewed  CBC  BASIC METABOLIC PANEL    Imaging Review Ct Maxillofacial W/cm  04/16/2015  CLINICAL DATA:  Left cheek swelling. EXAM: CT MAXILLOFACIAL WITH CONTRAST TECHNIQUE: Multidetector CT imaging of the maxillofacial structures was performed with intravenous contrast. Multiplanar CT image reconstructions were also generated. A small metallic BB was placed on the right temple in order to reliably differentiate right from left. CONTRAST:  78m OMNIPAQUE IOHEXOL 300 MG/ML  SOLN COMPARISON:  None. FINDINGS: The globes are intact. The orbital walls are intact. The orbital floors are intact. The maxilla is intact. The mandible is intact. The zygomatic arches are intact. The nasal septum is midline. There is no nasal bone fracture. The temporomandibular joints are normal. There is mild soft tissue edema within the subcutaneous fat of the left cheek. There is no drainable fluid collection. There is no hematoma. There are dental caries involving the left upper first and second premolars and left lower second premolar. There are dental caries involving the right upper first and second premolars. There is bilateral carotid artery atherosclerosis. There is mild bilateral maxillary sinus mucosal thickening. The visualized portions of the mastoid sinuses are well aerated. IMPRESSION: 1. Findings concerning for left facial cellulitis. No drainable abscess. 2. Multiple dental caries as described above. Recommend  correlation with dental exam. Electronically Signed   By: HKathreen Devoid  On: 04/16/2015 16:23   I have personally reviewed and evaluated these images and lab results as part of my medical decision-making.   EKG Interpretation None      MDM   Final diagnoses:  Cellulitis, face    54year old male with left sided facial swelling. Woke up like this earlier int he day, no fever, cough, rhinorrhea, dental pain. He doesn't feel like this originated with a tooth. Here L cheek swollen, noticeable swelling on buccal mucosa on the left. No discrete identifiable dental abscess. Will scan his face. Will give antibiotics. Well appearing otherwise, airway patent, nontoxic appearing.  CT shows mild L facial cellulitis, no abscess. I discussed admission vs discharge, patient would rather go home. I agree, he's well appearing and hasn't tried outpatient therapy. No fever or vomiting, I think he will do well. Will send him home on keflex and bactrim.   BEvelina Bucy MD 04/16/15 [phone]

## 2015-04-16 NOTE — Discharge Instructions (Signed)

## 2015-06-03 ENCOUNTER — Emergency Department (HOSPITAL_COMMUNITY)
Admission: EM | Admit: 2015-06-03 | Discharge: 2015-06-03 | Disposition: A | Discharge status: Home / Self Care | Payer: Self-pay | Attending: Emergency Medicine | Admitting: Emergency Medicine

## 2015-06-03 ENCOUNTER — Encounter (HOSPITAL_COMMUNITY): Payer: Self-pay | Admitting: Emergency Medicine

## 2015-06-03 DIAGNOSIS — Z9981 Dependence on supplemental oxygen: Secondary | ICD-10-CM | POA: Insufficient documentation

## 2015-06-03 DIAGNOSIS — Z7982 Long term (current) use of aspirin: Secondary | ICD-10-CM | POA: Insufficient documentation

## 2015-06-03 DIAGNOSIS — I252 Old myocardial infarction: Secondary | ICD-10-CM | POA: Insufficient documentation

## 2015-06-03 DIAGNOSIS — G4730 Sleep apnea, unspecified: Secondary | ICD-10-CM | POA: Insufficient documentation

## 2015-06-03 DIAGNOSIS — E785 Hyperlipidemia, unspecified: Secondary | ICD-10-CM | POA: Insufficient documentation

## 2015-06-03 DIAGNOSIS — R0981 Nasal congestion: Secondary | ICD-10-CM | POA: Insufficient documentation

## 2015-06-03 DIAGNOSIS — R05 Cough: Secondary | ICD-10-CM | POA: Insufficient documentation

## 2015-06-03 DIAGNOSIS — I10 Essential (primary) hypertension: Secondary | ICD-10-CM | POA: Insufficient documentation

## 2015-06-03 DIAGNOSIS — Z79899 Other long term (current) drug therapy: Secondary | ICD-10-CM | POA: Insufficient documentation

## 2015-06-03 DIAGNOSIS — H65191 Other acute nonsuppurative otitis media, right ear: Secondary | ICD-10-CM

## 2015-06-03 DIAGNOSIS — H6591 Unspecified nonsuppurative otitis media, right ear: Secondary | ICD-10-CM | POA: Insufficient documentation

## 2015-06-03 DIAGNOSIS — Z794 Long term (current) use of insulin: Secondary | ICD-10-CM | POA: Insufficient documentation

## 2015-06-03 DIAGNOSIS — M1990 Unspecified osteoarthritis, unspecified site: Secondary | ICD-10-CM | POA: Insufficient documentation

## 2015-06-03 DIAGNOSIS — R51 Headache: Secondary | ICD-10-CM | POA: Insufficient documentation

## 2015-06-03 DIAGNOSIS — R238 Other skin changes: Secondary | ICD-10-CM | POA: Insufficient documentation

## 2015-06-03 DIAGNOSIS — E119 Type 2 diabetes mellitus without complications: Secondary | ICD-10-CM | POA: Insufficient documentation

## 2015-06-03 MED ORDER — HYDROCODONE-ACETAMINOPHEN 5-325 MG PO TABS: 1.0000 | ORAL_TABLET | Freq: Four times a day (QID) | ORAL | Status: DC | PRN

## 2015-06-03 MED ORDER — AMOXICILLIN 500 MG PO CAPS: 500.0000 mg | ORAL_CAPSULE | Freq: Three times a day (TID) | ORAL | Status: DC

## 2015-06-03 MED ORDER — VALACYCLOVIR HCL 1 G PO TABS: 1000.0000 mg | ORAL_TABLET | Freq: Three times a day (TID) | ORAL | Status: DC

## 2015-06-03 MED ORDER — IBUPROFEN 400 MG PO TABS
800.0000 mg | ORAL_TABLET | Freq: Once | ORAL | Status: AC
  Administered 2015-06-03: 800 mg via ORAL
  Filled 2015-06-03: qty 2

## 2015-06-03 NOTE — ED Notes (Signed)
Patient states headache and R ear pain that started last night.   Patient states R ear pain only.   Patient denies drainage.  Patient denies taking anything at home for pain.

## 2015-06-03 NOTE — ED Provider Notes (Signed)
CSN: [id number]     Arrival date & time 06/03/15  0165 History  By signing my name below, I, Calvin [name], attest that this documentation has been prepared under the direction and in the presence of [name], [name]. Electronically Signed: Stephania [name], ED Scribe. 06/03/2015. 9:54 AM.    Chief Complaint  Patient presents with  . Otalgia   The history is provided by the patient. No language interpreter was used.    HPI Comments: Calvin [name] is a 54 y.o. male who presents to the Emergency Department complaining of atraumatic, gradual-onset, constant, right otalgia that began last night. He complains of an associated generalized headache, nasal congestion, and cough. Patient has not tried any medications or treatments for this at home. He reports a history of DM, sleep apnea, and MI that occurred 2-3 years ago. Patient denies any recent medication changes. He denies any left otalgia, ear drainage, or hearing loss. Patient has NKDA.   When asked about the blister in his ear, patient states he has not tried inserting anything, such as Q-tips or a bobby pin, to scratch or dig into his ear.  Past Medical History  Diagnosis Date  . Diabetes mellitus   . Hypertension   . Arthritis   . Hyperlipidemia   . Sleep apnea     cpap  . Heart attack [facility])    Past Surgical History  Procedure Laterality Date  . Forearm surgery    . Muscle biopsy    . Colonoscopy  05/13/11    9 adenomas   Family History  Problem Relation Age of Onset  . Heart disease Mother   . Heart disease Father   . Colon cancer Neg Hx   . Stomach cancer Neg Hx    Social History  Substance Use Topics  . Smoking status: Never Smoker   . Smokeless tobacco: Not on file  . Alcohol Use: No    Review of Systems  HENT: Positive for congestion and ear pain.   Respiratory: Positive for cough.   Neurological: Positive for headaches.    Allergies  Review of patient's allergies indicates no known allergies.  Home  Medications   Prior to Admission medications   Medication Sig Start Date End Date Taking? Authorizing Provider  albuterol (PROVENTIL HFA;VENTOLIN HFA) 108 (90 BASE) MCG/ACT inhaler Inhale 2 puffs into the lungs every 6 (six) hours as needed for wheezing.    Historical Provider, MD  amitriptyline (ELAVIL) 25 MG tablet Take 25 mg by mouth at bedtime.    Historical Provider, MD  aspirin EC 81 MG tablet Take 81 mg by mouth daily.    Historical Provider, MD  atorvastatin (LIPITOR) 40 MG tablet Take 40 mg by mouth daily.     Historical Provider, MD  bacitracin ointment Apply 1 application topically 2 (two) times daily. 12/19/14   Delsa Grana, [name]  cephALEXin (KEFLEX) 500 MG capsule Take 1 capsule (500 mg total) by mouth 4 (four) times daily. 12/19/14   Delsa Grana, [name]  cephALEXin (KEFLEX) 500 MG capsule Take 1 capsule (500 mg total) by mouth 3 (three) times daily. 04/16/15   Evelina Bucy, MD  glucose blood test strip 1 each by Other route See admin instructions. Check blood sugar 3 times daily.    Historical Provider, MD  HYDROcodone-acetaminophen (NORCO/VICODIN) 5-325 MG tablet Take 1 tablet by mouth every 6 (six) hours as needed for moderate pain. 04/16/15   Evelina Bucy, MD  insulin aspart (NOVOLOG) 100 UNIT/ML injection Inject 30-45 Units into  the skin 3 (three) times daily before meals. Per sliding scale    Historical Provider, MD  insulin detemir (LEVEMIR) 100 UNIT/ML injection Inject into the skin 2 (two) times daily.    Historical Provider, MD  nitroGLYCERIN (NITROSTAT) 0.4 MG SL tablet Place 0.4 mg under the tongue every 5 (five) minutes as needed for chest pain.    Historical Provider, MD  omeprazole (PRILOSEC) 20 MG capsule Take 1 capsule (20 mg total) by mouth daily. 01/17/14   Jola Schmidt, MD  promethazine (PHENERGAN) 25 MG tablet Take 1 tablet (25 mg total) by mouth every 6 (six) hours as needed for nausea or vomiting. Patient not taking: Reported on 12/19/2014 01/17/14   Jola Schmidt, MD    BP 134/74 mmHg  Pulse 97  Temp(Src) 97.8 F (36.6 C) (Oral)  Resp 17  SpO2 100% Physical Exam  Constitutional: He is oriented to person, place, and time. He appears well-developed and well-nourished. No distress.  HENT:  Head: Normocephalic and atraumatic.  Right Ear: Tympanic membrane is bulging.  Left Ear: Tympanic membrane and ear canal normal.  Right ear: Bulging right TM with air-fluid levels. There is also a fluid-filled blister in the ear canal, with some blood settled at the bottom of the canal.   Left ear: Normal.  Eyes: Conjunctivae and EOM are normal.  Neck: Neck supple. No tracheal deviation present.  Cardiovascular: Normal rate.   Pulmonary/Chest: Effort normal. No respiratory distress.  Musculoskeletal: Normal range of motion.  Neurological: He is alert and oriented to person, place, and time.  Skin: Skin is warm and dry.  Psychiatric: He has a normal mood and affect. His behavior is normal.  Nursing note and vitals reviewed.   ED Course  Procedures (including critical care time)  DIAGNOSTIC STUDIES: Oxygen Saturation is 100% on RA, normal by my interpretation.    COORDINATION OF CARE: 9:45 AM - Discussed treatment plan with pt at bedside which includes discussion of case with attending physician Dr. [place] Singer. Pt verbalized understanding and agreed to plan.   9:49 AM - Discussed patient's case with attending physician Dr. [place] Singer. He advises using Valtrex to cover for possible herpetic etiology of blister. Discussed this with pt, who verbalized understanding and agreed to plan.    MDM   Final diagnoses:  None    Patient presents with otalgia and exam consistent with acute otitis media. No concern for acute mastoiditis, meningitis.  No antibiotic use in the last month.  Patient discharged home with Amoxicillin. Will also discharge home with Valtrex to cover for possible herpetic etiology of blister in patient's ear.  Advised patient to call ENT for follow-up.  I  have also discussed reasons to return immediately to the ER, including if he develops any facial rashes, visual changes, and/or eye pain.  Patient expresses understanding and agrees with plan.   I personally performed the services described in this documentation, which was scribed in my presence. The recorded information has been reviewed and is accurate.        [name], [name] 06/03/15 [place], [name] 06/03/15 5300  Virgel Manifold, MD 06/06/15 2100

## 2015-06-03 NOTE — Discharge Instructions (Signed)

## 2015-11-20 ENCOUNTER — Emergency Department (HOSPITAL_COMMUNITY): Payer: Self-pay

## 2015-11-20 ENCOUNTER — Encounter (HOSPITAL_COMMUNITY): Payer: Self-pay | Admitting: Emergency Medicine

## 2015-11-20 ENCOUNTER — Observation Stay (HOSPITAL_COMMUNITY)
Admission: EM | Admit: 2015-11-20 | Discharge: 2015-11-22 | Disposition: A | Discharge status: Home / Self Care | Payer: Self-pay | Attending: Internal Medicine | Admitting: Internal Medicine

## 2015-11-20 DIAGNOSIS — G4730 Sleep apnea, unspecified: Secondary | ICD-10-CM | POA: Insufficient documentation

## 2015-11-20 DIAGNOSIS — E785 Hyperlipidemia, unspecified: Secondary | ICD-10-CM

## 2015-11-20 DIAGNOSIS — Z79899 Other long term (current) drug therapy: Secondary | ICD-10-CM | POA: Insufficient documentation

## 2015-11-20 DIAGNOSIS — R9431 Abnormal electrocardiogram [ECG] [EKG]: Secondary | ICD-10-CM | POA: Insufficient documentation

## 2015-11-20 DIAGNOSIS — Z7982 Long term (current) use of aspirin: Secondary | ICD-10-CM | POA: Insufficient documentation

## 2015-11-20 DIAGNOSIS — E119 Type 2 diabetes mellitus without complications: Secondary | ICD-10-CM

## 2015-11-20 DIAGNOSIS — I119 Hypertensive heart disease without heart failure: Secondary | ICD-10-CM

## 2015-11-20 DIAGNOSIS — Z8673 Personal history of transient ischemic attack (TIA), and cerebral infarction without residual deficits: Secondary | ICD-10-CM | POA: Insufficient documentation

## 2015-11-20 DIAGNOSIS — Z9981 Dependence on supplemental oxygen: Secondary | ICD-10-CM | POA: Insufficient documentation

## 2015-11-20 DIAGNOSIS — R739 Hyperglycemia, unspecified: Secondary | ICD-10-CM

## 2015-11-20 DIAGNOSIS — E1165 Type 2 diabetes mellitus with hyperglycemia: Secondary | ICD-10-CM

## 2015-11-20 DIAGNOSIS — E118 Type 2 diabetes mellitus with unspecified complications: Secondary | ICD-10-CM

## 2015-11-20 DIAGNOSIS — I10 Essential (primary) hypertension: Secondary | ICD-10-CM | POA: Insufficient documentation

## 2015-11-20 DIAGNOSIS — M1990 Unspecified osteoarthritis, unspecified site: Secondary | ICD-10-CM | POA: Insufficient documentation

## 2015-11-20 DIAGNOSIS — R079 Chest pain, unspecified: Principal | ICD-10-CM | POA: Insufficient documentation

## 2015-11-20 DIAGNOSIS — Z792 Long term (current) use of antibiotics: Secondary | ICD-10-CM | POA: Insufficient documentation

## 2015-11-20 DIAGNOSIS — E1159 Type 2 diabetes mellitus with other circulatory complications: Secondary | ICD-10-CM

## 2015-11-20 DIAGNOSIS — Z794 Long term (current) use of insulin: Secondary | ICD-10-CM | POA: Insufficient documentation

## 2015-11-20 DIAGNOSIS — R071 Chest pain on breathing: Secondary | ICD-10-CM

## 2015-11-20 DIAGNOSIS — I5022 Chronic systolic (congestive) heart failure: Secondary | ICD-10-CM | POA: Diagnosis present

## 2015-11-20 HISTORY — DX: Type 2 diabetes mellitus with other circulatory complications: E11.59

## 2015-11-20 HISTORY — DX: Hyperlipidemia, unspecified: E78.5

## 2015-11-20 HISTORY — DX: Type 2 diabetes mellitus without complications: E11.9

## 2015-11-20 LAB — COMPREHENSIVE METABOLIC PANEL
ALT: 21 U/L (ref 17–63)
AST: 20 U/L (ref 15–41)
Albumin: 3.5 g/dL (ref 3.5–5.0)
Alkaline Phosphatase: 78 U/L (ref 38–126)
Anion gap: 9 (ref 5–15)
BUN: 11 mg/dL (ref 6–20)
CO2: 23 mmol/L (ref 22–32)
Calcium: 9 mg/dL (ref 8.9–10.3)
Chloride: 106 mmol/L (ref 101–111)
Creatinine, Ser: 1.03 mg/dL (ref 0.61–1.24)
GFR calc Af Amer: >60 mL/min (ref >60)
GFR calc non Af Amer: >60 mL/min (ref >60)
Glucose, Bld: 202 mg/dL — ABNORMAL HIGH (ref 65–99)
Potassium: 3.5 mmol/L (ref 3.5–5.1)
Sodium: 138 mmol/L (ref 135–145)
Total Bilirubin: 0.6 mg/dL (ref 0.3–1.2)
Total Protein: 6.2 g/dL — ABNORMAL LOW (ref 6.5–8.1)

## 2015-11-20 LAB — CBC
HCT: 43.3 % (ref 39.0–52.0)
Hemoglobin: 15.1 g/dL (ref 13.0–17.0)
MCH: 30.2 pg (ref 26.0–34.0)
MCHC: 34.9 g/dL (ref 30.0–36.0)
MCV: 86.6 fL (ref 78.0–100.0)
Platelets: 180 10*3/uL (ref 150–400)
RBC: 5 MIL/uL (ref 4.22–5.81)
RDW: 12.1 % (ref 11.5–15.5)
WBC: 5.6 10*3/uL (ref 4.0–10.5)

## 2015-11-20 LAB — TROPONIN I
Troponin I: 0.03 ng/mL (ref <0.031)
Troponin I: <0.03 ng/mL (ref <0.031)
Troponin I: <0.03 ng/mL (ref <0.031)

## 2015-11-20 LAB — GLUCOSE, CAPILLARY: Glucose-Capillary: 335 mg/dL — ABNORMAL HIGH (ref 65–99)

## 2015-11-20 LAB — PROTIME-INR
INR: 1.09 (ref 0.00–1.49)
Prothrombin Time: 14.3 seconds (ref 11.6–15.2)

## 2015-11-20 LAB — I-STAT TROPONIN, ED: Troponin i, poc: 0.02 ng/mL (ref 0.00–0.08)

## 2015-11-20 LAB — HEPARIN LEVEL (UNFRACTIONATED): Heparin Unfractionated: 0.38 IU/mL (ref 0.30–0.70)

## 2015-11-20 LAB — BRAIN NATRIURETIC PEPTIDE: B Natriuretic Peptide: 88.6 pg/mL (ref 0.0–100.0)

## 2015-11-20 MED ORDER — PROMETHAZINE HCL 25 MG PO TABS: 25.0000 mg | ORAL_TABLET | Freq: Four times a day (QID) | ORAL | Status: DC | PRN

## 2015-11-20 MED ORDER — ACETAMINOPHEN 325 MG PO TABS
650.0000 mg | ORAL_TABLET | ORAL | Status: DC | PRN
  Administered 2015-11-21: 650 mg via ORAL
  Filled 2015-11-20: qty 2

## 2015-11-20 MED ORDER — ASPIRIN EC 81 MG PO TBEC
81.0000 mg | DELAYED_RELEASE_TABLET | Freq: Every day | ORAL | Status: DC
  Administered 2015-11-21 – 2015-11-22 (×2): 81 mg via ORAL
  Filled 2015-11-20 (×2): qty 1

## 2015-11-20 MED ORDER — INSULIN DETEMIR 100 UNIT/ML ~~LOC~~ SOLN
10.0000 [IU] | Freq: Two times a day (BID) | SUBCUTANEOUS | Status: DC
  Administered 2015-11-20 – 2015-11-21 (×2): 10 [IU] via SUBCUTANEOUS
  Filled 2015-11-20 (×3): qty 0.1

## 2015-11-20 MED ORDER — ASPIRIN 81 MG PO CHEW
324.0000 mg | CHEWABLE_TABLET | Freq: Once | ORAL | Status: DC
Start: 2015-11-20 — End: 2015-11-22

## 2015-11-20 MED ORDER — HEPARIN BOLUS VIA INFUSION
4000.0000 [IU] | Freq: Once | INTRAVENOUS | Status: AC
  Administered 2015-11-20: 4000 [IU] via INTRAVENOUS
  Filled 2015-11-20: qty 4000

## 2015-11-20 MED ORDER — HEPARIN (PORCINE) IN NACL 100-0.45 UNIT/ML-% IJ SOLN
850.0000 [IU]/h | INTRAMUSCULAR | Status: DC
  Administered 2015-11-20 – 2015-11-21 (×2): 850 [IU]/h via INTRAVENOUS
  Filled 2015-11-20 (×2): qty 250

## 2015-11-20 MED ORDER — ATORVASTATIN CALCIUM 40 MG PO TABS
40.0000 mg | ORAL_TABLET | Freq: Every day | ORAL | Status: DC
  Administered 2015-11-21 – 2015-11-22 (×2): 40 mg via ORAL
  Filled 2015-11-20 (×2): qty 1

## 2015-11-20 MED ORDER — HYDROCODONE-ACETAMINOPHEN 5-325 MG PO TABS
1.0000 | ORAL_TABLET | Freq: Four times a day (QID) | ORAL | Status: DC | PRN
  Administered 2015-11-21: 1 via ORAL

## 2015-11-20 MED ORDER — PANTOPRAZOLE SODIUM 40 MG PO TBEC
40.0000 mg | DELAYED_RELEASE_TABLET | Freq: Every day | ORAL | Status: DC
  Administered 2015-11-21 – 2015-11-22 (×2): 40 mg via ORAL
  Filled 2015-11-20 (×2): qty 1

## 2015-11-20 MED ORDER — ALBUTEROL SULFATE (2.5 MG/3ML) 0.083% IN NEBU: 2.5000 mg | INHALATION_SOLUTION | Freq: Four times a day (QID) | RESPIRATORY_TRACT | Status: DC | PRN

## 2015-11-20 MED ORDER — HEPARIN (PORCINE) IN NACL 100-0.45 UNIT/ML-% IJ SOLN: 1000.0000 [IU]/h | INTRAMUSCULAR | Status: DC

## 2015-11-20 MED ORDER — ONDANSETRON HCL 4 MG/2ML IJ SOLN: 4.0000 mg | Freq: Four times a day (QID) | INTRAMUSCULAR | Status: DC | PRN

## 2015-11-20 MED ORDER — HEPARIN BOLUS VIA INFUSION: 4000.0000 [IU] | Freq: Once | INTRAVENOUS | Status: DC

## 2015-11-20 MED ORDER — INSULIN ASPART 100 UNIT/ML ~~LOC~~ SOLN
0.0000 [IU] | Freq: Three times a day (TID) | SUBCUTANEOUS | Status: DC
  Administered 2015-11-21 (×2): 5 [IU] via SUBCUTANEOUS
  Administered 2015-11-21 – 2015-11-22 (×2): 11 [IU] via SUBCUTANEOUS

## 2015-11-20 MED ORDER — NITROGLYCERIN 0.4 MG SL SUBL
0.4000 mg | SUBLINGUAL_TABLET | SUBLINGUAL | Status: DC | PRN
  Administered 2015-11-20 (×2): 0.4 mg via SUBLINGUAL
  Filled 2015-11-20 (×2): qty 1

## 2015-11-20 NOTE — ED Notes (Signed)
Dinner tray at bedside

## 2015-11-20 NOTE — Consult Note (Signed)
Cardiology Consult    Patient ID: Calvin [name] MRN: [id number], DOB/AGE: 56/22/1962   Admit date: 11/20/2015 Date of Consult: 11/20/2015  Primary Physician: No PCP Per Patient Primary Cardiologist: New Requesting Provider: Dr. Jeanell Sparrow  Patient Profile    55 yo male with PMH of DM II Insulin Dependent/HTN/HLD reports an MI about 2 years ago while living in [place] who presented to the [facility] ED from PCP office c/o chest pain.   Past Medical History   Past Medical History  Diagnosis Date  . Diabetes mellitus   . Hypertension   . Arthritis   . Hyperlipidemia   . Sleep apnea     cpap  . Heart attack ([place])     While living in [place].    Past Surgical History  Procedure Laterality Date  . Forearm surgery    . Muscle biopsy    . Colonoscopy  05/13/11    9 adenomas     Allergies  No Known Allergies  History of Present Illness    Calvin [name] is a 55 year old African-American male with a past medical history of hypertension/hyperlipidemia/DM II insulin-dependent who is normally seen at [facility]. Reports he was living in [place] about 2 years ago when he had a reported MI, states this was without intervention and was only placed on "medications". Reports being compliant with medications.  Reports being in his normal state of health up until about one week ago. States he was watching television whenever he developed left-sided chest pain that was sharp in nature. Reports this pain radiated throughout the entire left side of his body. Rates the pain in 9/10, with symptoms noted with rest and activity. Associated symptoms of dyspnea and fatigue. Reports some decrease of his activities of daily living related to this pain. Also had a dry cough, but denies any fevers chills or lower extremity edema. He held out on being seen for the symptoms until today when he was seen at his PCP office. He reports that this pain stayed consistent mostly at a 9/10 for the entire week. At that visit  he reported these symptoms to his PCP and instructed to come to the ER for further evaluation.  In the ED, he was noted to have a negative when of care troponin, BNP 88.6, hemoglobin 15.1, creatinine 1.03, along with a negative chest x-ray. EKG did show sinus rhythm with some abnormalities with noted T-wave inversion in the anterior/ lateral leads, which appears to be a new finding since tracing in 01/17/2014. He was given aspirin along with 2 sublingual nitroglycerin, but pain still persists. Was started on IV heparin via EDP.  Cardiology has been consulted in relation to patient's report of chest pain.   Inpatient Medications    . aspirin  324 mg Oral Once  . heparin  4,000 Units Intravenous Once    Family History    Family History  Problem Relation Age of Onset  . Heart disease Mother   . Heart disease Father   . Colon cancer Neg Hx   . Stomach cancer Neg Hx   . Heart attack Father 58  . Heart attack Mother 25  . Heart attack Sister 43  . Hypertension Father   . Hypertension Mother   . Hyperlipidemia Father   . Hyperlipidemia Mother     Social History    Social History   Social History  . Marital Status: Single    Spouse Name: N/A  . Number of Children: N/A  . Years  of Education: N/A   Occupational History  . Not on file.   Social History Main Topics  . Smoking status: Never Smoker   . Smokeless tobacco: Not on file  . Alcohol Use: No  . Drug Use: No  . Sexual Activity: Not on file   Other Topics Concern  . Not on file   Social History Narrative     Review of Systems    General:  No chills, fever, night sweats or weight changes.  Cardiovascular:  + chest pain, dyspnea on exertion, edema, orthopnea, palpitations, paroxysmal nocturnal dyspnea. Dermatological: No rash, lesions/masses Respiratory: No cough, + dyspnea Urologic: No hematuria, dysuria Abdominal:   No nausea, vomiting, diarrhea, bright red blood per rectum, melena, or hematemesis Neurologic:   No visual changes, wkns, changes in mental status, +fatigue All other systems reviewed and are otherwise negative except as noted above.  Physical Exam    Blood pressure 144/83, pulse 85, temperature 97.7 F (36.5 C), resp. rate 12, weight 162 lb 12.8 oz (73.846 kg), SpO2 99 %.  General: Pleasant AA male, NAD Psych: Normal affect. Neuro: Alert and oriented X 3. Moves all extremities spontaneously. HEENT: Normal  Neck: Supple without bruits or JVD. Lungs:  Resp regular and unlabored, CTA. Heart: RRR no s3, + s4, or murmurs. Abdomen: Soft, non-tender, non-distended, BS + x 4.  Extremities: No clubbing, cyanosis or edema. DP/PT/Radials 2+ and equal bilaterally.  Labs    Troponin [facility] of Care Test)  Recent Labs  11/20/15 1256  TROPIPOC 0.02   No results for input(s): CKTOTAL, CKMB, TROPONINI in the last 72 hours. Lab Results  Component Value Date   WBC 5.6 11/20/2015   HGB 15.1 11/20/2015   HCT 43.3 11/20/2015   MCV 86.6 11/20/2015   PLT 180 11/20/2015     Recent Labs Lab 11/20/15 1250  NA 138  K 3.5  CL 106  CO2 23  BUN 11  CREATININE 1.03  CALCIUM 9.0  PROT 6.2*  BILITOT 0.6  ALKPHOS 78  ALT 21  AST 20  GLUCOSE 202*   No results found for: CHOL, HDL, LDLCALC, TRIG Lab Results  Component Value Date   DDIMER 0.25 12/20/2011     Radiology Studies    Dg Chest Portable 1 View  11/20/2015  CLINICAL DATA:  Chest pain. EXAM: PORTABLE CHEST 1 VIEW COMPARISON:  January 17, 2014. FINDINGS: The heart size and mediastinal contours are within normal limits. Both lungs are clear. No pneumothorax or pleural effusion is noted. The visualized skeletal structures are unremarkable. IMPRESSION: No acute cardiopulmonary abnormality seen. Electronically Signed   By: Marijo Conception, M.D.   On: 11/20/2015 13:22    ECG & Cardiac Imaging    EKG: Sinus rhythm Rate-78, T-wave inversion in anterior/lateral leads   Assessment & Plan    1. Chest pain with moderate risk factors:  He reports a one-week history of left-sided chest pain, with radiation up and down the entire left side of his body. Associated symptoms of dyspnea and fatigue with chest pain. He was sent to the ED for further evaluation from PCP office today when he presented with the symptoms. In the ED has had a negative POC troponin, stable creatinine along with hemoglobin. Chest x-ray was negative, vital signs stable and not currently hypertensive. He was given 324 of aspirin, along with 2 sublingual nitroglycerin which did not relieve his pain. On physical exam he is noted to have chest wall pain, which does suggest a musculoskeletal component. --  Given both his personal and family risk factors, would set patient up for treadmill stress test. --Cycle troponins 3 --Started on heparin via EDP, will continue.  --Will make NPO tonight  2: HTN: Currently controlled, we'll assess the need for further agent.  3: HLD: On statin, no recent lipid panel. Will recheck  4. DM II: Currently on insulin at home, with continued.   Barnet Pall, NP-C Pager [phone] 11/20/2015, 4:56 PM  I have seen and examined the patient along with Reino Bellis, NP-C.  I have reviewed the chart, notes and new data.  I agree with NP's note.  Key new complaints: chest pain tenderness is clearly costochondritis. Key examination changes: loud distinct S4, no overt hypervolemia Key new findings / data: ECG shows prominent inferolateral T wave inversions that could be ascribed to either LVH or ischemia. They are similar to 2014, but were not present on ECG from 2015  PLAN: I am more concerned about his ECG changes (new from most recent tracing) then I am about his chest pain symptoms. I suspect he has LVH. Not sure how to explain the normal tracing from 2015. He has virtually every known conventional CAD risk factor. Recommend echo and stress test tomorrow. Rx for costochondritis with antiinflammatories.  Sanda Klein,  MD, [place] [phone] 11/20/2015, 5:20 PM

## 2015-11-20 NOTE — Progress Notes (Signed)
ANTICOAGULATION CONSULT NOTE - Initial Consult  Pharmacy Consult for heparin Indication: chest pain/ACS  No Known Allergies  Patient Measurements: Weight: 162 lb 12.8 oz (73.846 kg) Heparin Dosing Weight: 73.8 kg  Vital Signs: Temp: 97.7 F (36.5 C) (05/19 1223) BP: 129/86 mmHg (05/19 1615) Pulse Rate: 78 (05/19 1615)  Labs:  Recent Labs  11/20/15 1250  HGB 15.1  HCT 43.3  PLT 180  LABPROT 14.3  INR 1.09  CREATININE 1.03    CrCl cannot be calculated (Unknown ideal weight.).   Medical History: Past Medical History  Diagnosis Date  . Diabetes mellitus   . Hypertension   . Arthritis   . Hyperlipidemia   . Sleep apnea     cpap  . Heart attack ([place])     Medications:  Scheduled:  . aspirin  324 mg Oral Once  . heparin  4,000 Units Intravenous Once    Assessment: 55 yo man admitted 11/20/2015 with CP. Pharmacy consulted to dose heparin.  PMH DM, HTN  CBC wnl, no noted bleeding. No anticoagulants prior to admission.   Goal of Therapy:  Heparin level 0.3-0.7 units/ml Monitor platelets by anticoagulation protocol: Yes   Plan:  Give 4000 units bolus x 1 Start heparin infusion at 850 units/hr Check anti-Xa level in 6 hours and daily while on heparin Continue to monitor H&H and platelets  Monitor s/sx bleeding   Heloise Ochoa, Pharm.D., BCPS PGY2 Cardiology Pharmacy Resident Pager: [phone] 11/20/2015,4:39 PM

## 2015-11-20 NOTE — ED Notes (Signed)
Pt arrives via [facility] from his pcp, upon arrival to his pcp for med refills, pt c/o left sided chest pain x1 week with some sob and and a headache. Pt was found to by hyperglycemic at his pcp and was given 10 units of insulin, ems reports cbg of 254. Pt received 1 sl nitro and 341m asa prior to ems arrival. Pt reports increase of pain with movement of arms and palpation of chest.  Pt a/ox4.

## 2015-11-20 NOTE — ED Notes (Signed)
Patient reported no chest pain relief from 1 sl nitro and refused second sl nitro.

## 2015-11-20 NOTE — ED Notes (Signed)
MD at bedside. 

## 2015-11-20 NOTE — ED Provider Notes (Signed)
CSN: [id number]     Arrival date & time 11/20/15  1216 History   First MD Initiated Contact with Patient 11/20/15 1225     Chief Complaint  Patient presents with  . Chest Pain     (Consider location/radiation/quality/duration/timing/severity/associated sxs/prior Treatment) HPI   55 year old male with diabetes and hypertension who presents today stating that he has had chest pain for week. He describes the pain is in the left chest. It is severe. He is not able to tell me anything that has made it better or worse. He states it is on the entire left side of his body radiating from chest all the way down to his leg. He has not had any trauma, ho dvt or pe, no weakness, headache. He has had exertional dyspnea that has caused him to stop walking around.   Past Medical History  Diagnosis Date  . Diabetes mellitus   . Hypertension   . Arthritis   . Hyperlipidemia   . Sleep apnea     cpap  . Heart attack [facility])    Past Surgical History  Procedure Laterality Date  . Forearm surgery    . Muscle biopsy    . Colonoscopy  05/13/11    9 adenomas   Family History  Problem Relation Age of Onset  . Heart disease Mother   . Heart disease Father   . Colon cancer Neg Hx   . Stomach cancer Neg Hx    Social History  Substance Use Topics  . Smoking status: Never Smoker   . Smokeless tobacco: None  . Alcohol Use: No    Review of Systems  All other systems reviewed and are negative.     Allergies  Review of patient's allergies indicates no known allergies.  Home Medications   Prior to Admission medications   Medication Sig Start Date End Date Taking? Authorizing Provider  aspirin EC 81 MG tablet Take 81 mg by mouth daily.   Yes Historical Provider, MD  atorvastatin (LIPITOR) 40 MG tablet Take 40 mg by mouth daily.    Yes Historical Provider, MD  insulin aspart (NOVOLOG) 100 UNIT/ML injection Inject 30-45 Units into the skin 3 (three) times daily before meals. Per sliding scale   Yes  Historical Provider, MD  insulin detemir (LEVEMIR) 100 UNIT/ML injection Inject into the skin 2 (two) times daily.   Yes Historical Provider, MD  omeprazole (PRILOSEC) 20 MG capsule Take 1 capsule (20 mg total) by mouth daily. 01/17/14  Yes Jola Schmidt, MD  albuterol (PROVENTIL HFA;VENTOLIN HFA) 108 (90 BASE) MCG/ACT inhaler Inhale 2 puffs into the lungs every 6 (six) hours as needed for wheezing.    Historical Provider, MD  amitriptyline (ELAVIL) 25 MG tablet Take 25 mg by mouth at bedtime.    Historical Provider, MD  amoxicillin (AMOXIL) 500 MG capsule Take 1 capsule (500 mg total) by mouth 3 (three) times daily. 06/03/15   [name], [name]  bacitracin ointment Apply 1 application topically 2 (two) times daily. 12/19/14   Delsa Grana, [name]  cephALEXin (KEFLEX) 500 MG capsule Take 1 capsule (500 mg total) by mouth 4 (four) times daily. 12/19/14   Delsa Grana, [name]  cephALEXin (KEFLEX) 500 MG capsule Take 1 capsule (500 mg total) by mouth 3 (three) times daily. 04/16/15   Evelina Bucy, MD  glucose blood test strip 1 each by Other route See admin instructions. Check blood sugar 3 times daily.    Historical Provider, MD  HYDROcodone-acetaminophen (NORCO/VICODIN) 5-325 MG tablet Take  1-2 tablets by mouth every 6 (six) hours as needed for moderate pain. 06/03/15   [name], [name]  nitroGLYCERIN (NITROSTAT) 0.4 MG SL tablet Place 0.4 mg under the tongue every 5 (five) minutes as needed for chest pain.    Historical Provider, MD  promethazine (PHENERGAN) 25 MG tablet Take 1 tablet (25 mg total) by mouth every 6 (six) hours as needed for nausea or vomiting. 01/17/14   Jola Schmidt, MD  valACYclovir (VALTREX) 1000 MG tablet Take 1 tablet (1,000 mg total) by mouth 3 (three) times daily. 06/03/15   Abigail Harris, [name]   BP 132/84 mmHg  Pulse 86  Temp(Src) 97.7 F (36.5 C)  Resp 20  SpO2 100% Physical Exam  Constitutional: He is oriented to person, place, and time. He appears well-developed and  well-nourished.  HENT:  Head: Normocephalic and atraumatic.  Right Ear: External ear normal.  Left Ear: External ear normal.  Nose: Nose normal.  Mouth/Throat: Oropharynx is clear and moist.  Eyes: Conjunctivae and EOM are normal. Pupils are equal, round, and reactive to light.  Neck: Normal range of motion. Neck supple.  Cardiovascular: Normal rate, regular rhythm, normal heart sounds and intact distal pulses.   Pulmonary/Chest: Effort normal and breath sounds normal.  Abdominal: Soft. Bowel sounds are normal.  Musculoskeletal: Normal range of motion.  Neurological: He is alert and oriented to person, place, and time.  Skin: Skin is warm and dry.  Psychiatric: He has a normal mood and affect. His behavior is normal. Judgment and thought content normal.  Nursing note and vitals reviewed.   ED Course  Procedures (including critical care time) Labs Review Labs Reviewed  COMPREHENSIVE METABOLIC PANEL - Abnormal; Notable for the following:    Glucose, Bld 202 (*)    Total Protein 6.2 (*)    All other components within normal limits  CBC  BRAIN NATRIURETIC PEPTIDE  PROTIME-INR  I-STAT TROPOININ, ED  Randolm Idol, ED    Imaging Review Dg Chest Portable 1 View  11/20/2015  CLINICAL DATA:  Chest pain. EXAM: PORTABLE CHEST 1 VIEW COMPARISON:  January 17, 2014. FINDINGS: The heart size and mediastinal contours are within normal limits. Both lungs are clear. No pneumothorax or pleural effusion is noted. The visualized skeletal structures are unremarkable. IMPRESSION: No acute cardiopulmonary abnormality seen. Electronically Signed   By: Marijo Conception, M.D.   On: 11/20/2015 13:22   I have personally reviewed and evaluated these images and lab results as part of my medical decision-making.   EKG Interpretation   Date/Time:  Friday Nov 20 2015 12:21:19 EDT Ventricular Rate:  78 PR Interval:  177 QRS Duration: 104 QT Interval:  362 QTC Calculation: 412 R Axis:   35 Text  Interpretation:  Sinus rhythm Abnormal T, consider ischemia, diffuse  leads Confirmed by Darlynn Ricco MD, Atziry Baranski [phone]) on 11/20/2015 12:41:23 PM      MDM   Final diagnoses:  Abnormal EKG  Chest pain, unspecified chest pain type  Hyperglycemia   Heart score 5-  Heart pathway would indicate admit to observation wit cardiac imaging.  Patient with chest pain decreased after one nitro and asa.  Initial troponin negative.  Patient agreed to second nitro.  Doubt nstemi as negative trop with pain present for a week, but given t wave inversion with chest pain will need to be admitted for further evaluation.  Discussed with Dr. Ellyn Hack. Plan heparinization. Cardiology will be in to evaluate   Pattricia Boss, MD 11/20/15 [phone]

## 2015-11-20 NOTE — ED Notes (Signed)
Pt ate 1/4 of his meal, stated he "thought I was hungry, but I guess I was wrong...just tired now"

## 2015-11-20 NOTE — ED Notes (Signed)
Dr. Jeanell Sparrow in to speak with patient. Dr. Jeanell Sparrow aware that patient refused second nitro earlier from this RN. MD advised this RN to administer another nitro to patient at this time as patient has agreed to take it.

## 2015-11-20 NOTE — H&P (Signed)
History and Physical    Akito Boomhower YSA:[id number] DOB: [birth year] DOA: 11/20/2015  PCP: No PCP Per Patient  Patient coming from: home   Chief Complaint: chest pain  HPI: Calvin [name] is a 55 y.o. male with medical history significant of hypertension, hyperlipidemia, presents to the emergency room with a chief complaint of chest pain. Patient states that he has had chest pain for the past week, he started while he was watching TV and not doing anything extending [place]. He states that his chest pain. On the left side of the chest, also feeling on the left side of his whole body, and has been there for the past week pretty much all the time without complete remission. He also endorses that when he walks his chest pain is getting worse, and he also gets short of breath and he needs to stop. With resting shortness of breath is improving and his chest pain is easing off some. He denies any nausea or vomiting, denies any abdominal pain. He denies any fever or chills however he "feels cold all the time". He endorses a headache. He has no cough or chest congestion. He tells me his diabetes is poorly controlled, he is not sure how much insulin he is injecting himself as he tells me he has a sliding scale at home. States that he is using his Levemir as sliding scale and NovoLog as fixed number of units with each meal. He is a very poor historian. He also tells her that he had a heart attack about 2 years ago when he was in [place], and he was managed medically. He never smoked and never consumed alcohol.   ED Course: In the ED he was found to have an EKG with T-wave inversions which is new, cardiology was consulted and recommended admission for chest pain rule out, recommended heparin infusion as well as stress test in the morning. His vital signs are stable, his blood work is fairly unremarkable, CBC is normal.  Review of Systems: As per HPI otherwise 10 point review of systems negative.   Past  Medical History  Diagnosis Date  . Diabetes mellitus   . Hypertension   . Arthritis   . Hyperlipidemia   . Sleep apnea     cpap  . Heart attack ([place])     While living in [place]    Past Surgical History  Procedure Laterality Date  . Forearm surgery    . Muscle biopsy    . Colonoscopy  05/13/11    9 adenomas     reports that he has never smoked. He does not have any smokeless tobacco history on file. He reports that he does not drink alcohol or use illicit drugs.  No Known Allergies  Family History  Problem Relation Age of Onset  . Heart disease Mother   . Heart disease Father   . Colon cancer Neg Hx   . Stomach cancer Neg Hx   . Heart attack Father 35  . Heart attack Mother 72  . Heart attack Sister 52  . Hypertension Father   . Hypertension Mother   . Hyperlipidemia Father   . Hyperlipidemia Mother     Prior to Admission medications   Medication Sig Start Date End Date Taking? Authorizing Provider  aspirin EC 81 MG tablet Take 81 mg by mouth daily.   Yes Historical Provider, MD  atorvastatin (LIPITOR) 40 MG tablet Take 40 mg by mouth daily.    Yes Historical Provider, MD  insulin aspart (  NOVOLOG) 100 UNIT/ML injection Inject 30-45 Units into the skin 3 (three) times daily before meals. Per sliding scale   Yes Historical Provider, MD  insulin detemir (LEVEMIR) 100 UNIT/ML injection Inject into the skin 2 (two) times daily.   Yes Historical Provider, MD  omeprazole (PRILOSEC) 20 MG capsule Take 1 capsule (20 mg total) by mouth daily. 01/17/14  Yes Jola Schmidt, MD  albuterol (PROVENTIL HFA;VENTOLIN HFA) 108 (90 BASE) MCG/ACT inhaler Inhale 2 puffs into the lungs every 6 (six) hours as needed for wheezing.    Historical Provider, MD  amitriptyline (ELAVIL) 25 MG tablet Take 25 mg by mouth at bedtime.    Historical Provider, MD  amoxicillin (AMOXIL) 500 MG capsule Take 1 capsule (500 mg total) by mouth 3 (three) times daily. 06/03/15   [name], [name]  bacitracin ointment  Apply 1 application topically 2 (two) times daily. 12/19/14   Delsa Grana, [name]  cephALEXin (KEFLEX) 500 MG capsule Take 1 capsule (500 mg total) by mouth 4 (four) times daily. 12/19/14   Delsa Grana, [name]  cephALEXin (KEFLEX) 500 MG capsule Take 1 capsule (500 mg total) by mouth 3 (three) times daily. 04/16/15   Evelina Bucy, MD  glucose blood test strip 1 each by Other route See admin instructions. Check blood sugar 3 times daily.    Historical Provider, MD  HYDROcodone-acetaminophen (NORCO/VICODIN) 5-325 MG tablet Take 1-2 tablets by mouth every 6 (six) hours as needed for moderate pain. 06/03/15   [name], [name]  nitroGLYCERIN (NITROSTAT) 0.4 MG SL tablet Place 0.4 mg under the tongue every 5 (five) minutes as needed for chest pain.    Historical Provider, MD  promethazine (PHENERGAN) 25 MG tablet Take 1 tablet (25 mg total) by mouth every 6 (six) hours as needed for nausea or vomiting. 01/17/14   Jola Schmidt, MD  valACYclovir (VALTREX) 1000 MG tablet Take 1 tablet (1,000 mg total) by mouth 3 (three) times daily. 06/03/15   [name], [name]    Physical Exam: Filed Vitals:   11/20/15 1645 11/20/15 1700 11/20/15 1715 11/20/15 1730  BP: 144/83 124/73 137/84 147/86  Pulse: 85 83 78 77  Temp:      Resp: 12 22 16 15   Weight:      SpO2: 99% 100% 100% 97%      Constitutional: NAD, calm, comfortable Filed Vitals:   11/20/15 1645 11/20/15 1700 11/20/15 1715 11/20/15 1730  BP: 144/83 124/73 137/84 147/86  Pulse: 85 83 78 77  Temp:      Resp: 12 22 16 15   Weight:      SpO2: 99% 100% 100% 97%   Eyes: PERRL, lids and conjunctivae normal ENMT: Mucous membranes are moist. Posterior pharynx clear of any exudate or lesions. Respiratory: clear to auscultation bilaterally, no wheezing, no crackles. Normal respiratory effort. No accessory muscle use.  Cardiovascular: Regular rate and rhythm, no murmurs / rubs / gallops. No extremity edema. 2+ pedal pulses.  Abdomen: no tenderness, no  masses palpated. Bowel sounds positive. Musculoskeletal: no clubbing / cyanosis. Very tender to palpation on anterior chest.  Skin: no rashes, lesions, ulcers. No induration Neurologic: CN 2-12 grossly intact. Sensation intact, DTR normal. Strength 5/5 in all 4.  Psychiatric: Normal judgment and insight. Alert and oriented x 3. Normal mood.   Labs on Admission: I have personally reviewed following labs and imaging studies  CBC:  Recent Labs Lab 11/20/15 1250  WBC 5.6  HGB 15.1  HCT 43.3  MCV 86.6  PLT 180  Basic Metabolic Panel:  Recent Labs Lab 11/20/15 1250  NA 138  K 3.5  CL 106  CO2 23  GLUCOSE 202*  BUN 11  CREATININE 1.03  CALCIUM 9.0   GFR: CrCl cannot be calculated (Unknown ideal weight.). Liver Function Tests:  Recent Labs Lab 11/20/15 1250  AST 20  ALT 21  ALKPHOS 78  BILITOT 0.6  PROT 6.2*  ALBUMIN 3.5   No results for input(s): LIPASE, AMYLASE in the last 168 hours. No results for input(s): AMMONIA in the last 168 hours. Coagulation Profile:  Recent Labs Lab 11/20/15 1250  INR 1.09   Cardiac Enzymes: No results for input(s): CKTOTAL, CKMB, CKMBINDEX, TROPONINI in the last 168 hours. BNP (last 3 results) No results for input(s): PROBNP in the last 8760 hours. HbA1C: No results for input(s): HGBA1C in the last 72 hours. CBG: No results for input(s): GLUCAP in the last 168 hours. Lipid Profile: No results for input(s): CHOL, HDL, LDLCALC, TRIG, CHOLHDL, LDLDIRECT in the last 72 hours. Thyroid Function Tests: No results for input(s): TSH, T4TOTAL, FREET4, T3FREE, THYROIDAB in the last 72 hours. Anemia Panel: No results for input(s): VITAMINB12, FOLATE, FERRITIN, TIBC, IRON, RETICCTPCT in the last 72 hours. Urine analysis:    Component Value Date/Time   COLORURINE YELLOW 11/28/2012 1857   APPEARANCEUR CLEAR 11/28/2012 1857   LABSPEC 1.020 11/28/2012 1857   PHURINE 5.5 11/28/2012 1857   GLUCOSEU >1000* 11/28/2012 1857   HGBUR  NEGATIVE 11/28/2012 1857   BILIRUBINUR NEGATIVE 11/28/2012 1857   KETONESUR NEGATIVE 11/28/2012 1857   PROTEINUR NEGATIVE 11/28/2012 1857   UROBILINOGEN 0.2 11/28/2012 1857   NITRITE NEGATIVE 11/28/2012 1857   LEUKOCYTESUR NEGATIVE 11/28/2012 1857    Radiological Exams on Admission: Dg Chest Portable 1 View  11/20/2015  CLINICAL DATA:  Chest pain. EXAM: PORTABLE CHEST 1 VIEW COMPARISON:  January 17, 2014. FINDINGS: The heart size and mediastinal contours are within normal limits. Both lungs are clear. No pneumothorax or pleural effusion is noted. The visualized skeletal structures are unremarkable. IMPRESSION: No acute cardiopulmonary abnormality seen. Electronically Signed   By: Marijo Conception, M.D.   On: 11/20/2015 13:22    EKG: Independently reviewed. Sinus rhythm with T-wave inversions in V4-5-6  Assessment/Plan Active Problems:   Chest pain   DM (diabetes mellitus) (HCC)   HLD (hyperlipidemia)   Chest pain - Patient's chest pain is very atypical as it is reproducible to palpation, has been constant for the past week. Troponin is negative in the ED. His EKG is however concerning with T-wave inversions which are new. He is diabetic, has family history of heart attacks in his parents in their 33s. Cardiology was consulted in the ED, recommended admission for observation and plan for stress test in the morning. On heparin gtt. We'll make nothing by mouth after midnight. Cycle cardiac enzymes overnight. Repeat EKG in the morning.  Diabetes mellitus - Obtain hemoglobin A1c to determine control. We'll need to discuss again with the patient as it seems unlikely that he should be getting his Levemir sliding scale and NovoLog is fixed mealtime dosed. Diabetes educator consult. - Place on Levemir 10 units twice a day and moderate sliding scale  Hyperlipidemia - Continue home statin  HTN - borderline hypertensive in the ED, monitor and will start tx if necessary    DVT prophylaxis:  heparin gtt  Code Status: Full  Family Communication:  No family bedside Disposition Plan: admit to tele Consults called: cardiology   Admission status: obs  Marzetta Board, MD [facility] Pager [phone]  If 7PM-7AM, please contact night-coverage [email or web address] Password [facility]  11/20/2015, 6:13 PM

## 2015-11-20 NOTE — ED Notes (Signed)
Patient undressed in gown, on monitor, continuous pulse oximetry and blood pressure cuff

## 2015-11-21 ENCOUNTER — Observation Stay (HOSPITAL_COMMUNITY): Payer: Self-pay

## 2015-11-21 DIAGNOSIS — R079 Chest pain, unspecified: Secondary | ICD-10-CM

## 2015-11-21 DIAGNOSIS — R0789 Other chest pain: Secondary | ICD-10-CM

## 2015-11-21 LAB — GLUCOSE, CAPILLARY
Glucose-Capillary: 235 mg/dL — ABNORMAL HIGH (ref 65–99)
Glucose-Capillary: 238 mg/dL — ABNORMAL HIGH (ref 65–99)
Glucose-Capillary: 306 mg/dL — ABNORMAL HIGH (ref 65–99)
Glucose-Capillary: 235 mg/dL — ABNORMAL HIGH (ref 65–99)

## 2015-11-21 LAB — LIPID PANEL
Cholesterol: 221 mg/dL — ABNORMAL HIGH (ref 0–200)
HDL: 45 mg/dL (ref >40)
LDL Cholesterol: 149 mg/dL — ABNORMAL HIGH (ref 0–99)
Total CHOL/HDL Ratio: 4.9 RATIO
Triglycerides: 135 mg/dL (ref <150)
VLDL: 27 mg/dL (ref 0–40)

## 2015-11-21 LAB — NM MYOCAR MULTI W/SPECT W/WALL MOTION / EF
Peak HR: 104 {beats}/min
Rest HR: 80 {beats}/min

## 2015-11-21 LAB — BASIC METABOLIC PANEL
Anion gap: 10 (ref 5–15)
BUN: 11 mg/dL (ref 6–20)
CO2: 25 mmol/L (ref 22–32)
Calcium: 8.7 mg/dL — ABNORMAL LOW (ref 8.9–10.3)
Chloride: 102 mmol/L (ref 101–111)
Creatinine, Ser: 1.14 mg/dL (ref 0.61–1.24)
GFR calc Af Amer: >60 mL/min (ref >60)
GFR calc non Af Amer: >60 mL/min (ref >60)
Glucose, Bld: 256 mg/dL — ABNORMAL HIGH (ref 65–99)
Potassium: 3.7 mmol/L (ref 3.5–5.1)
Sodium: 137 mmol/L (ref 135–145)

## 2015-11-21 LAB — CBC
HCT: 44.3 % (ref 39.0–52.0)
Hemoglobin: 15.2 g/dL (ref 13.0–17.0)
MCH: 30 pg (ref 26.0–34.0)
MCHC: 34.3 g/dL (ref 30.0–36.0)
MCV: 87.4 fL (ref 78.0–100.0)
Platelets: 187 10*3/uL (ref 150–400)
RBC: 5.07 MIL/uL (ref 4.22–5.81)
RDW: 12.3 % (ref 11.5–15.5)
WBC: 5.8 10*3/uL (ref 4.0–10.5)

## 2015-11-21 LAB — TROPONIN I: Troponin I: <0.03 ng/mL (ref <0.031)

## 2015-11-21 LAB — HEPARIN LEVEL (UNFRACTIONATED): Heparin Unfractionated: 0.54 IU/mL (ref 0.30–0.70)

## 2015-11-21 MED ORDER — REGADENOSON 0.4 MG/5ML IV SOLN
INTRAVENOUS | Status: AC
  Administered 2015-11-21: 10:00:00
  Filled 2015-11-21: qty 5

## 2015-11-21 MED ORDER — TECHNETIUM TC 99M TETROFOSMIN IV KIT
10.0000 | PACK | Freq: Once | INTRAVENOUS | Status: AC | PRN
  Administered 2015-11-21: 10 via INTRAVENOUS

## 2015-11-21 MED ORDER — INSULIN ASPART 100 UNIT/ML ~~LOC~~ SOLN
3.0000 [IU] | Freq: Three times a day (TID) | SUBCUTANEOUS | Status: DC
  Administered 2015-11-22: 3 [IU] via SUBCUTANEOUS

## 2015-11-21 MED ORDER — INSULIN DETEMIR 100 UNIT/ML ~~LOC~~ SOLN
12.0000 [IU] | Freq: Two times a day (BID) | SUBCUTANEOUS | Status: DC
  Administered 2015-11-21 – 2015-11-22 (×2): 12 [IU] via SUBCUTANEOUS
  Filled 2015-11-21 (×3): qty 0.12

## 2015-11-21 MED ORDER — HYDROCODONE-ACETAMINOPHEN 5-325 MG PO TABS
ORAL_TABLET | ORAL | Status: AC
  Administered 2015-11-21: 10:00:00
  Filled 2015-11-21: qty 1

## 2015-11-21 MED ORDER — TECHNETIUM TC 99M TETROFOSMIN IV KIT
30.0000 | PACK | Freq: Once | INTRAVENOUS | Status: AC | PRN
  Administered 2015-11-21: 30 via INTRAVENOUS

## 2015-11-21 MED ORDER — CARVEDILOL 3.125 MG PO TABS
3.1250 mg | ORAL_TABLET | Freq: Two times a day (BID) | ORAL | Status: DC
  Administered 2015-11-21 – 2015-11-22 (×2): 3.125 mg via ORAL
  Filled 2015-11-21 (×2): qty 1

## 2015-11-21 MED ORDER — TECHNETIUM TC 99M SESTAMIBI GENERIC - CARDIOLITE: 10.0000 | Freq: Once | INTRAVENOUS | Status: DC | PRN

## 2015-11-21 MED ORDER — METHOCARBAMOL 500 MG PO TABS
500.0000 mg | ORAL_TABLET | Freq: Once | ORAL | Status: AC
  Administered 2015-11-21: 500 mg via ORAL
  Filled 2015-11-21: qty 1

## 2015-11-21 MED ORDER — REGADENOSON 0.4 MG/5ML IV SOLN
0.4000 mg | Freq: Once | INTRAVENOUS | Status: AC
  Administered 2015-11-21: 0.4 mg via INTRAVENOUS

## 2015-11-21 NOTE — Progress Notes (Signed)
    Subjective: Still with a little CP 6/10.  Objective: Vital signs in last 24 hours: Temp:  [97.6 F (36.4 C)-98.4 F (36.9 C)] 97.6 F (36.4 C) (05/20 0400) Pulse Rate:  [77-95] 92 (05/20 0900) Resp:  [11-25] 17 (05/20 0400) BP: (115-167)/(64-96) 115/81 mmHg (05/20 0900) SpO2:  [97 %-100 %] 100 % (05/20 0400) Weight:  [161 lb 14.4 oz (73.437 kg)-162 lb 12.8 oz (73.846 kg)] 161 lb 14.4 oz (73.437 kg) (05/20 0400) Last BM Date:  (can't recall last BM)  Intake/Output from previous day: 05/19 0701 - 05/20 0700 In: 240 [P.O.:240] Out: 525 [Urine:525] Intake/Output this shift:    Medications Scheduled Meds: . aspirin  324 mg Oral Once  . aspirin EC  81 mg Oral Daily  . atorvastatin  40 mg Oral Daily  . insulin aspart  0-15 Units Subcutaneous TID WC  . insulin detemir  10 Units Subcutaneous BID  . pantoprazole  40 mg Oral Daily   Continuous Infusions: . heparin 850 Units/hr (11/20/15 1735)   PRN Meds:.acetaminophen, albuterol, HYDROcodone-acetaminophen, nitroGLYCERIN, ondansetron (ZOFRAN) IV, promethazine  PE:  Well nourished, well developed, in no acute distress HEENT: Pupils are equal round react to light accommodation extraocular movements are intact.  Neck: no JVDNo cervical lymphadenopathy. Cardiac: Regular rate and rhythm without murmurs rubs or gallops. Lungs:  clear to auscultation bilaterally, no wheezing, rhonchi or rales Ext: no lower extremity edema.  2+ radial and dorsalis pedis pulses. Skin: warm and dry Neuro:  Grossly normal  Lab Results:   Recent Labs  11/20/15 1250 11/21/15 0545  WBC 5.6 5.8  HGB 15.1 15.2  HCT 43.3 44.3  PLT 180 187   BMET  Recent Labs  11/20/15 1250 11/21/15 0545  NA 138 137  K 3.5 3.7  CL 106 102  CO2 23 25  GLUCOSE 202* 256*  BUN 11 11  CREATININE 1.03 1.14  CALCIUM 9.0 8.7*   PT/INR  Recent Labs  11/20/15 1250  LABPROT 14.3  INR 1.09   Cholesterol  Recent Labs  11/21/15 0545  CHOL 221*    Lipid Panel     Component Value Date/Time   CHOL 221* 11/21/2015 0545   TRIG 135 11/21/2015 0545   HDL 45 11/21/2015 0545   CHOLHDL 4.9 11/21/2015 0545   VLDL 27 11/21/2015 0545   LDLCALC 149* 11/21/2015 0545   Cardiac Panel (last 3 results)  Recent Labs  11/20/15 2012 11/20/15 2312 11/21/15 0545  TROPONINI <0.03 <0.03 <0.03    Assessment/Plan  Active Problems:   Chest pain   DM (diabetes mellitus) (HCC)   HLD (hyperlipidemia)  Ruled out for MI.  CP 6/10 radiating down left side. [place] he was on the treadmill at a HR of 117, the Cp did not get worse and there was not change in EKG.   Stopped treadmill due to excessive fatigue and changed to [place] which he tolerated well.  BP stable.     Tarri Fuller [name] 11/21/2015 9:47 AM  Patient examined chart reviewed Atypical pain fairly constant R/O Unable to achieve adequate HR with exercise Changed to [place] pending Ok to d/c home if normal  [employer]

## 2015-11-21 NOTE — Progress Notes (Signed)
Inpatient Diabetes Program Recommendations  AACE/ADA: New Consensus Statement on Inpatient Glycemic Control (2015)  Target Ranges:  Prepandial:   less than 140 mg/dL      Peak postprandial:   less than 180 mg/dL (1-2 hours)      Critically ill patients:  140 - 180 mg/dL   Review of Glycemic Control  Diabetes history: DM 2 Outpatient Diabetes medications: Levemir BID (dose not listed on home med rec), Novolog 30-45 units TID Current orders for Inpatient glycemic control: Levemir 10 units BID, Novolog Moderate TID  Inpatient Diabetes Program Recommendations:  Patient NPO this am received 10 units of Levemir last night and glucose is elevated still at 235 mg/dl. Consider increasing Levemir tonight to 12 units BID.  A1c is pending. Will most likely result on Monday due to the lab being done out of the facility. Did note elevated glucose in the 200s on admission. Tried to call in patient's room for dose verification of Levemir and compliance since patient is showing as no insurance. No answer. Will follow trends while here.  Thanks,  Tama Headings RN, MSN, [facility] Inpatient Diabetes Coordinator Team Pager [phone] (8a-5p)

## 2015-11-21 NOTE — Progress Notes (Signed)
ANTICOAGULATION CONSULT NOTE - Follow-up Consult  Pharmacy Consult for heparin Indication: chest pain/ACS  No Known Allergies  Patient Measurements: Height: 5' 9"  (175.3 cm) Weight: 162 lb 4.8 oz (73.619 kg) IBW/kg (Calculated) : 70.7 Heparin Dosing Weight: 73.8 kg  Vital Signs: Temp: 98.4 F (36.9 C) (05/19 2107) Temp Source: Oral (05/19 2107) BP: 117/64 mmHg (05/19 2107) Pulse Rate: 93 (05/19 2107)  Labs:  Recent Labs  11/20/15 1250 11/20/15 1730 11/20/15 2012 11/20/15 2312 11/20/15 2314  HGB 15.1  --   --   --   --   HCT 43.3  --   --   --   --   PLT 180  --   --   --   --   LABPROT 14.3  --   --   --   --   INR 1.09  --   --   --   --   HEPARINUNFRC  --   --   --   --  0.38  CREATININE 1.03  --   --   --   --   TROPONINI  --  0.03 <0.03 <0.03  --     Estimated Creatinine Clearance: 81 mL/min (by C-G formula based on Cr of 1.03).   Assessment: 55 yo male on heparin for r/o ACS. Heparin level therapeutic on 850 units/hr. No bleeding noted.  Goal of Therapy:  Heparin level 0.3-0.7 units/ml Monitor platelets by anticoagulation protocol: Yes   Plan:  Continue heparin 850 units/hr F/u a.m. level to confirm therapeutic  Sherlon Handing, PharmD, BCPS Clinical pharmacist, pager [phone] 11/21/2015,12:15 AM

## 2015-11-21 NOTE — Progress Notes (Signed)
ANTICOAGULATION CONSULT NOTE - Follow-up Consult  Pharmacy Consult for heparin Indication: chest pain/ACS  No Known Allergies  Patient Measurements: Height: 5' 9"  (175.3 cm) Weight: 161 lb 14.4 oz (73.437 kg) IBW/kg (Calculated) : 70.7 Heparin Dosing Weight: 73.8 kg  Vital Signs: Temp: 97.6 F (36.4 C) (05/20 0400) BP: 121/64 mmHg (05/20 1009) Pulse Rate: 96 (05/20 1009)  Labs:  Recent Labs  11/20/15 1250  11/20/15 2012 11/20/15 2312 11/20/15 2314 11/21/15 0545  HGB 15.1  --   --   --   --  15.2  HCT 43.3  --   --   --   --  44.3  PLT 180  --   --   --   --  187  LABPROT 14.3  --   --   --   --   --   INR 1.09  --   --   --   --   --   HEPARINUNFRC  --   --   --   --  0.38 0.54  CREATININE 1.03  --   --   --   --  1.14  TROPONINI  --   < > <0.03 <0.03  --  <0.03  < > = values in this interval not displayed.  Estimated Creatinine Clearance: 73.2 mL/min (by C-G formula based on Cr of 1.14).   Assessment: 55 yo male on heparin for r/o ACS. Heparin level therapeutic on 850 units/hr. No bleeding noted. Pending lexiscan  Goal of Therapy:  Heparin level 0.3-0.7 units/ml Monitor platelets by anticoagulation protocol: Yes   Plan:  Continue heparin 850 units/hr F/u a.m. level to confirm therapeutic  Onnie Boer, PharmD Pager: [phone] 11/21/2015 12:11 PM

## 2015-11-21 NOTE — Progress Notes (Signed)
PROGRESS NOTE  Shooter Tangen JEH:[id number] DOB: Jul 20, 1960 DOA: 11/20/2015 PCP: No PCP Per Patient     Brief Narrative: 55 y.o. male with medical history significant of hypertension, hyperlipidemia, presents to the emergency room with a chief complaint of chest pain.  Assessment & Plan: Active Problems:   Chest pain   DM (diabetes mellitus) (HCC)   HLD (hyperlipidemia)   Chest pain - atypical however patient had significant EKG changes - underwent a stress test today which shows no reversible ischemia however it did sho global hypokinesis, LV dilatation and EF 30% - appreciate cardiology input re further testing - obtain a 2D echo  Patient offers additional information today that he has been having intermittent chest tightness over the past 2 years with occasional LE swelling that "comes and goes"  CHFrEF - appears chronic, without decompensation currently - start low dose Coreg - will need ACEI prior to d/c, if BP OK on Coreg will start tomorrow  Diabetes mellitus - A1C not back yet - CBGs need further control, increase Lantus and add scheduled mealtime insulin  Hyperlipidemia - Continue home statin  HTN - start Coreg, ACEI tomorrow  DVT prophylaxis: Heparin Code Status: Full Family Communication: d/w family bedside Disposition Plan: home when ready   Consultants:   Cardiology   Procedures:   2D echo: pending  Stress test  Antimicrobials:  None    Subjective: - ongoing chest pain, no palpitations.   Objective: Filed Vitals:   11/21/15 1005 11/21/15 1007 11/21/15 1009 11/21/15 1410  BP: 132/84 119/63 121/64 149/85  Pulse: 102 98 96 91  Temp:    97.8 F (36.6 C)  TempSrc:    Oral  Resp:    18  Height:      Weight:      SpO2:    100%    Intake/Output Summary (Last 24 hours) at 11/21/15 1707 Last data filed at 11/21/15 1300  Gross per 24 hour  Intake    720 ml  Output    525 ml  Net    195 ml   Filed Weights   11/20/15 1610  11/20/15 2011 11/21/15 0400  Weight: 73.846 kg (162 lb 12.8 oz) 73.619 kg (162 lb 4.8 oz) 73.437 kg (161 lb 14.4 oz)    Examination: Constitutional: NAD Filed Vitals:   11/21/15 1005 11/21/15 1007 11/21/15 1009 11/21/15 1410  BP: 132/84 119/63 121/64 149/85  Pulse: 102 98 96 91  Temp:    97.8 F (36.6 C)  TempSrc:    Oral  Resp:    18  Height:      Weight:      SpO2:    100%   Eyes: lids and conjunctivae normal ENMT: Mucous membranes are moist.  Respiratory: clear to auscultation bilaterally, no wheezing, no crackles. Normal respiratory effort. No accessory muscle use.  Cardiovascular: Regular rate and rhythm, no murmurs / rubs / gallops. No LE edema. 2+ pedal pulses. No carotid bruits.  Abdomen: no tenderness. Bowel sounds positive.   Data Reviewed: I have personally reviewed following labs and imaging studies  CBC:  Recent Labs Lab 11/20/15 1250 11/21/15 0545  WBC 5.6 5.8  HGB 15.1 15.2  HCT 43.3 44.3  MCV 86.6 87.4  PLT 180 378   Basic Metabolic Panel:  Recent Labs Lab 11/20/15 1250 11/21/15 0545  NA 138 137  K 3.5 3.7  CL 106 102  CO2 23 25  GLUCOSE 202* 256*  BUN 11 11  CREATININE 1.03 1.14  CALCIUM 9.0  8.7*   GFR: Estimated Creatinine Clearance: 73.2 mL/min (by C-G formula based on Cr of 1.14). Liver Function Tests:  Recent Labs Lab 11/20/15 1250  AST 20  ALT 21  ALKPHOS 78  BILITOT 0.6  PROT 6.2*  ALBUMIN 3.5   No results for input(s): LIPASE, AMYLASE in the last 168 hours. No results for input(s): AMMONIA in the last 168 hours. Coagulation Profile:  Recent Labs Lab 11/20/15 1250  INR 1.09   Cardiac Enzymes:  Recent Labs Lab 11/20/15 1730 11/20/15 2012 11/20/15 2312 11/21/15 0545  TROPONINI 0.03 <0.03 <0.03 <0.03   BNP (last 3 results) No results for input(s): PROBNP in the last 8760 hours. HbA1C: No results for input(s): HGBA1C in the last 72 hours. CBG:  Recent Labs Lab 11/20/15 2115 11/21/15 0749 11/21/15 1148    GLUCAP 335* 235* 238*   Lipid Profile:  Recent Labs  11/21/15 0545  CHOL 221*  HDL 45  LDLCALC 149*  TRIG 135  CHOLHDL 4.9   Thyroid Function Tests: No results for input(s): TSH, T4TOTAL, FREET4, T3FREE, THYROIDAB in the last 72 hours. Anemia Panel: No results for input(s): VITAMINB12, FOLATE, FERRITIN, TIBC, IRON, RETICCTPCT in the last 72 hours. Urine analysis:    Component Value Date/Time   COLORURINE YELLOW 11/28/2012 1857   APPEARANCEUR CLEAR 11/28/2012 1857   LABSPEC 1.020 11/28/2012 1857   PHURINE 5.5 11/28/2012 1857   GLUCOSEU >1000* 11/28/2012 1857   HGBUR NEGATIVE 11/28/2012 1857   BILIRUBINUR NEGATIVE 11/28/2012 1857   KETONESUR NEGATIVE 11/28/2012 1857   PROTEINUR NEGATIVE 11/28/2012 1857   UROBILINOGEN 0.2 11/28/2012 1857   NITRITE NEGATIVE 11/28/2012 1857   LEUKOCYTESUR NEGATIVE 11/28/2012 1857   Sepsis Labs: Invalid input(s): PROCALCITONIN, LACTICIDVEN  No results found for this or any previous visit (from the past 240 hour(s)).    Radiology Studies: Nm Myocar Multi W/spect W/wall Motion / Ef  11/21/2015  ADDENDUM REPORT: 11/21/2015 15:48 ADDENDUM: Dictation error in the findings and impression of the original report. There is no evidence of a fixed defect. Additionally, there is a typo in the perfusion section, which should read "No findings to suggest reversible ischemia." ADDENDED IMPRESSION: 1.  No reversible ischemia or infarction. 2.  Global hypokinesis.  Associated left ventricular dilatation. 3.  Left ventricular ejection fraction 30%. 4. Non invasive risk stratification*: High, based on low ejection fraction. *2012 Appropriate Use Criteria for Coronary Revascularization Focused Update: J Am Coll Cardiol. 3220;25(4):270-623. http://content.[email or web address]?[email or web address] Electronically Signed   By: Julian Hy M.D.   On: 11/21/2015 15:48  11/21/2015  CLINICAL DATA:  Cardiomyopathy, increasing shortness of breath EXAM: MYOCARDIAL  IMAGING WITH SPECT (REST AND PHARMACOLOGIC-STRESS) GATED LEFT VENTRICULAR WALL MOTION STUDY LEFT VENTRICULAR EJECTION FRACTION TECHNIQUE: Standard myocardial SPECT imaging was performed after resting intravenous injection of 10 mCi Tc-51mtetrofosmin. Subsequently, intravenous infusion of Lexiscan was performed under the supervision of the Cardiology staff. At peak effect of the drug, 30 mCi Tc-924metrofosmin was injected intravenously and standard myocardial SPECT imaging was performed. Quantitative gated imaging was also performed to evaluate left ventricular wall motion, and estimate left ventricular ejection fraction. COMPARISON:  None. FINDINGS: Perfusion: Large fixed defect along the inferolateral wall. No findings to suggest reversible is the. Wall Motion: Global hypokinesis. Associated left ventricular dilatation. Left Ventricular Ejection Fraction: 30 % End diastolic volume 13762l End systolic volume 94 ml IMPRESSION: 1. No reversible ischemia or infarction. Large fixed defect along the inferolateral wall, raising the possibility of prior myocardial infarction. 2. Global hypokinesis.  Associated left  ventricular dilatation. 3. Left ventricular ejection fraction 30% 4. Non invasive risk stratification*: High, based on low ejection fraction and a large fixed defect with LV dilatation. *2012 Appropriate Use Criteria for Coronary Revascularization Focused Update: J Am Coll Cardiol. 8916;94(5):038-882. http://content.[email or web address]?[email or web address] Electronically Signed: By: Julian Hy M.D. On: 11/21/2015 15:32   Dg Chest Portable 1 View  11/20/2015  CLINICAL DATA:  Chest pain. EXAM: PORTABLE CHEST 1 VIEW COMPARISON:  January 17, 2014. FINDINGS: The heart size and mediastinal contours are within normal limits. Both lungs are clear. No pneumothorax or pleural effusion is noted. The visualized skeletal structures are unremarkable. IMPRESSION: No acute cardiopulmonary abnormality seen.  Electronically Signed   By: Marijo Conception, M.D.   On: 11/20/2015 13:22     Scheduled Meds: . aspirin  324 mg Oral Once  . aspirin EC  81 mg Oral Daily  . atorvastatin  40 mg Oral Daily  . carvedilol  3.125 mg Oral BID WC  . HYDROcodone-acetaminophen      . insulin aspart  0-15 Units Subcutaneous TID WC  . insulin detemir  10 Units Subcutaneous BID  . pantoprazole  40 mg Oral Daily  . regadenoson       Continuous Infusions: . heparin 850 Units/hr (11/20/15 1735)    Marzetta Board, MD, PhD [facility] Pager [phone]  If 7PM-7AM, please contact night-coverage [email or web address] Password [email or web address] 11/21/2015, 5:07 PM

## 2015-11-22 ENCOUNTER — Observation Stay (HOSPITAL_BASED_OUTPATIENT_CLINIC_OR_DEPARTMENT_OTHER): Payer: Self-pay

## 2015-11-22 DIAGNOSIS — I5022 Chronic systolic (congestive) heart failure: Secondary | ICD-10-CM | POA: Diagnosis present

## 2015-11-22 DIAGNOSIS — I509 Heart failure, unspecified: Secondary | ICD-10-CM

## 2015-11-22 DIAGNOSIS — I10 Essential (primary) hypertension: Secondary | ICD-10-CM | POA: Diagnosis present

## 2015-11-22 HISTORY — DX: Chronic systolic (congestive) heart failure: I50.22

## 2015-11-22 HISTORY — DX: Essential (primary) hypertension: I10

## 2015-11-22 LAB — BASIC METABOLIC PANEL
Anion gap: 8 (ref 5–15)
BUN: 12 mg/dL (ref 6–20)
CO2: 26 mmol/L (ref 22–32)
Calcium: 8.8 mg/dL — ABNORMAL LOW (ref 8.9–10.3)
Chloride: 102 mmol/L (ref 101–111)
Creatinine, Ser: 1.23 mg/dL (ref 0.61–1.24)
GFR calc Af Amer: >60 mL/min (ref >60)
GFR calc non Af Amer: >60 mL/min (ref >60)
Glucose, Bld: 298 mg/dL — ABNORMAL HIGH (ref 65–99)
Potassium: 3.9 mmol/L (ref 3.5–5.1)
Sodium: 136 mmol/L (ref 135–145)

## 2015-11-22 LAB — GLUCOSE, CAPILLARY
Glucose-Capillary: 322 mg/dL — ABNORMAL HIGH (ref 65–99)
Glucose-Capillary: 79 mg/dL (ref 65–99)

## 2015-11-22 LAB — ECHOCARDIOGRAM COMPLETE
Height: 69 in
Weight: 2665.6 oz

## 2015-11-22 LAB — CBC
HCT: 45.1 % (ref 39.0–52.0)
Hemoglobin: 15.2 g/dL (ref 13.0–17.0)
MCH: 29.8 pg (ref 26.0–34.0)
MCHC: 33.7 g/dL (ref 30.0–36.0)
MCV: 88.4 fL (ref 78.0–100.0)
Platelets: 201 10*3/uL (ref 150–400)
RBC: 5.1 MIL/uL (ref 4.22–5.81)
RDW: 12.3 % (ref 11.5–15.5)
WBC: 5.9 10*3/uL (ref 4.0–10.5)

## 2015-11-22 LAB — HEPARIN LEVEL (UNFRACTIONATED): Heparin Unfractionated: 0.4 IU/mL (ref 0.30–0.70)

## 2015-11-22 MED ORDER — CARVEDILOL 3.125 MG PO TABS: 3.1250 mg | ORAL_TABLET | Freq: Two times a day (BID) | ORAL | Status: DC

## 2015-11-22 MED ORDER — LISINOPRIL 10 MG PO TABS
10.0000 mg | ORAL_TABLET | Freq: Every day | ORAL | Status: DC
  Administered 2015-11-22: 10 mg via ORAL
  Filled 2015-11-22: qty 1

## 2015-11-22 MED ORDER — LISINOPRIL 10 MG PO TABS: 10.0000 mg | ORAL_TABLET | Freq: Every day | ORAL | Status: DC

## 2015-11-22 MED ORDER — HYDROCODONE-ACETAMINOPHEN 5-325 MG PO TABS: 1.0000 | ORAL_TABLET | Freq: Four times a day (QID) | ORAL | Status: DC | PRN

## 2015-11-22 NOTE — Progress Notes (Signed)
  Echocardiogram 2D Echocardiogram has been performed.  Calvin [name] 11/22/2015, 9:56 AM

## 2015-11-22 NOTE — Discharge Summary (Signed)
Physician Discharge Summary  Calvin [name] OZH:[id number] DOB: [birth year] DOA: 11/20/2015  PCP: No PCP Per Patient  Admit date: 11/20/2015 Discharge date: 11/22/2015  Time spent: > 30 minutes  Recommendations for Outpatient Follow-up:  1. Follow-up with cardiology in 1-2 weeks 2. Follow-up with PCP in 1-2 weeks for diabetes management  Discharge Diagnoses:  Active Problems:   Chest pain   DM (diabetes mellitus) ([place])   HLD (hyperlipidemia)   Chronic systolic heart failure [facility])   Essential hypertension   Discharge Condition: Stable  Diet recommendation: Heart healthy/diabetic  Filed Weights   11/20/15 2011 11/21/15 0400 11/22/15 0625  Weight: 73.619 kg (162 lb 4.8 oz) 73.437 kg (161 lb 14.4 oz) 75.569 kg (166 lb 9.6 oz)    History of present illness:  See H&P, Labs, Consult and Test reports for all details in brief, patient is a 55 y.o. male with medical history significant of hypertension, hyperlipidemia, presents to the emergency room with a chief complaint of chest pain  Hospital Course:  Chest pain in the setting of congestive heart failure with reduced ejection fraction, unknown chronicity - Patient was admitted to the hospital with atypical chest pain, reproducible with palpation, on the left side of his chest, left axilla, left arm, shoulder and scapula as well as left flank and left lower extremity. This is very atypical to be cardiac in origin however his initial EKG on admission showed T wave inversions in V4, 5 and 6 which are new. Cardiology was consulted and followed patient while hospitalized. He underwent a stress test on 5/20 which showed no reversible ischemia, however he did show global hypokinesis and depressed ejection fraction. He underwent a 2-D echo on 5/21 which showed an ejection fraction of 40% with diffuse hypokinesis, and grade 1 diastolic dysfunction. Patient had no edema, and no other signs of fluid overload, his heart failure appears to be  chronic in nature. He did report that occasionally he gets chest tightness. He occasionally also reports lower extremity swelling that "come and go". He was placed on Coreg as well as an ACE inhibitor, tolerating well. Cardiology. The patient for discharge home, he was sent home in stable condition.  Diabetes mellitus - A1c is not back yet, however suspect poorly controlled, advised patient to discuss this further as an outpatient Hypertension - started on Coreg and lisinopril, monitor in an outpatient setting further Hyperlipidemia - already on statin  Procedures:  2D echo Study Conclusions - Left ventricle: The cavity size was mildly dilated. Wall thickness was normal. The estimated ejection fraction was 40%. Diffuse hypokinesis. Doppler parameters are consistent with abnormal left ventricular relaxation (grade 1 diastolic dysfunction). - Atrial septum: No defect or patent foramen ovale was identified.   Consultations:  Cardiology   Discharge Exam: Filed Vitals:   11/21/15 1009 11/21/15 1410 11/21/15 2100 11/22/15 0625  BP: 121/64 149/85 132/79 135/77  Pulse: 96 91 91 85  Temp:  97.8 F (36.6 C) 98.3 F (36.8 C) 98.2 F (36.8 C)  TempSrc:  Oral    Resp:  18 20 18   Height:      Weight:    75.569 kg (166 lb 9.6 oz)  SpO2:  100% 100% 98%    General: No acute distress Cardiovascular: Regular rate and rhythm, no murmurs  Respiratory: Clear to auscultation bilaterally   Discharge Instructions Activity:  As tolerated   Get Medicines reviewed and adjusted: Please take all your medications with you for your next visit with your Primary MD  Please  request your Primary MD to go over all hospital tests and procedure/radiological results at the follow up, please ask your Primary MD to get all Hospital records sent to his/her office.  If you experience worsening of your admission symptoms, develop shortness of breath, life threatening emergency, suicidal or homicidal thoughts you  must seek medical attention immediately by calling 911 or calling your MD immediately if symptoms less severe.  You must read complete instructions/literature along with all the possible adverse reactions/side effects for all the Medicines you take and that have been prescribed to you. Take any new Medicines after you have completely understood and accpet all the possible adverse reactions/side effects.   Do not drive when taking Pain medications.   Do not take more than prescribed Pain, Sleep and Anxiety Medications  Special Instructions: If you have smoked or chewed Tobacco in the last 2 yrs please stop smoking, stop any regular Alcohol and or any Recreational drug use.  Wear Seat belts while driving.  Please note  You were cared for by a hospitalist during your hospital stay. Once you are discharged, your primary care physician will handle any further medical issues. Please note that NO REFILLS for any discharge medications will be authorized once you are discharged, as it is imperative that you return to your primary care physician (or establish a relationship with a primary care physician if you do not have one) for your aftercare needs so that they can reassess your need for medications and monitor your lab values.    Medication List    STOP taking these medications        amoxicillin 500 MG capsule  Commonly known as:  AMOXIL     bacitracin ointment     cephALEXin 500 MG capsule  Commonly known as:  KEFLEX     valACYclovir 1000 MG tablet  Commonly known as:  VALTREX      TAKE these medications        albuterol 108 (90 Base) MCG/ACT inhaler  Commonly known as:  PROVENTIL HFA;VENTOLIN HFA  Inhale 2 puffs into the lungs every 6 (six) hours as needed for wheezing.     amitriptyline 25 MG tablet  Commonly known as:  ELAVIL  Take 25 mg by mouth at bedtime.     aspirin EC 81 MG tablet  Take 81 mg by mouth daily.     atorvastatin 40 MG tablet  Commonly known as:   LIPITOR  Take 40 mg by mouth daily.     carvedilol 3.125 MG tablet  Commonly known as:  COREG  Take 1 tablet (3.125 mg total) by mouth 2 (two) times daily with a meal.     glucose blood test strip  1 each by Other route See admin instructions. Check blood sugar 3 times daily.     HYDROcodone-acetaminophen 5-325 MG tablet  Commonly known as:  NORCO/VICODIN  Take 1-2 tablets by mouth every 6 (six) hours as needed for moderate pain.     insulin aspart 100 UNIT/ML injection  Commonly known as:  novoLOG  Inject 30-45 Units into the skin 3 (three) times daily before meals. Per sliding scale     insulin detemir 100 UNIT/ML injection  Commonly known as:  LEVEMIR  Inject into the skin 2 (two) times daily.     lisinopril 10 MG tablet  Commonly known as:  PRINIVIL,ZESTRIL  Take 1 tablet (10 mg total) by mouth daily.     nitroGLYCERIN 0.4 MG SL tablet  Commonly known  as:  NITROSTAT  Place 0.4 mg under the tongue every 5 (five) minutes as needed for chest pain.     omeprazole 20 MG capsule  Commonly known as:  PRILOSEC  Take 1 capsule (20 mg total) by mouth daily.     promethazine 25 MG tablet  Commonly known as:  PHENERGAN  Take 1 tablet (25 mg total) by mouth every 6 (six) hours as needed for nausea or vomiting.           Follow-up Information    Follow up with Sanda Klein, MD. Schedule an appointment as soon as possible for a visit in 2 weeks.   Specialty:  Cardiology   Contact information:   [address] [place] [address] [phone]       The results of significant diagnostics from this hospitalization (including imaging, microbiology, ancillary and laboratory) are listed below for reference.    Significant Diagnostic Studies: Nm Myocar Multi W/spect W/wall Motion / Ef  11/21/2015  ADDENDUM REPORT: 11/21/2015 15:48 ADDENDUM: Dictation error in the findings and impression of the original report. There is no evidence of a fixed defect.  Additionally, there is a typo in the perfusion section, which should read "No findings to suggest reversible ischemia." ADDENDED IMPRESSION: 1.  No reversible ischemia or infarction. 2.  Global hypokinesis.  Associated left ventricular dilatation. 3.  Left ventricular ejection fraction 30%. 4. Non invasive risk stratification*: High, based on low ejection fraction. *2012 Appropriate Use Criteria for Coronary Revascularization Focused Update: J Am Coll Cardiol. 9629;52(8):413-244. http://content.[email or web address]?articleid=[email or web address] Electronically Signed   By: Julian Hy M.D.   On: 11/21/2015 15:48  11/21/2015  CLINICAL DATA:  Cardiomyopathy, increasing shortness of breath EXAM: MYOCARDIAL IMAGING WITH SPECT (REST AND PHARMACOLOGIC-STRESS) GATED LEFT VENTRICULAR WALL MOTION STUDY LEFT VENTRICULAR EJECTION FRACTION TECHNIQUE: Standard myocardial SPECT imaging was performed after resting intravenous injection of 10 mCi Tc-25mtetrofosmin. Subsequently, intravenous infusion of Lexiscan was performed under the supervision of the Cardiology staff. At peak effect of the drug, 30 mCi Tc-911metrofosmin was injected intravenously and standard myocardial SPECT imaging was performed. Quantitative gated imaging was also performed to evaluate left ventricular wall motion, and estimate left ventricular ejection fraction. COMPARISON:  None. FINDINGS: Perfusion: Large fixed defect along the inferolateral wall. No findings to suggest reversible is the. Wall Motion: Global hypokinesis. Associated left ventricular dilatation. Left Ventricular Ejection Fraction: 30 % End diastolic volume 13010l End systolic volume 94 ml IMPRESSION: 1. No reversible ischemia or infarction. Large fixed defect along the inferolateral wall, raising the possibility of prior myocardial infarction. 2. Global hypokinesis.  Associated left ventricular dilatation. 3. Left ventricular ejection fraction 30% 4. Non invasive risk stratification*:  High, based on low ejection fraction and a large fixed defect with LV dilatation. *2012 Appropriate Use Criteria for Coronary Revascularization Focused Update: J Am Coll Cardiol. 202725;36(6):440-347http://content.[email or web address]?[email or web address] Electronically Signed: By: SrJulian Hy.D. On: 11/21/2015 15:32   Dg Chest Portable 1 View  11/20/2015  CLINICAL DATA:  Chest pain. EXAM: PORTABLE CHEST 1 VIEW COMPARISON:  January 17, 2014. FINDINGS: The heart size and mediastinal contours are within normal limits. Both lungs are clear. No pneumothorax or pleural effusion is noted. The visualized skeletal structures are unremarkable. IMPRESSION: No acute cardiopulmonary abnormality seen. Electronically Signed   By: JaMarijo ConceptionM.D.   On: 11/20/2015 13:22    Microbiology: No results found for this or any previous visit (from the past 240 hour(s)).   Labs: Basic Metabolic Panel:  Recent  Labs Lab 11/20/15 1250 11/21/15 0545 11/22/15 0429  NA 138 137 136  K 3.5 3.7 3.9  CL 106 102 102  CO2 23 25 26   GLUCOSE 202* 256* 298*  BUN 11 11 12   CREATININE 1.03 1.14 1.23  CALCIUM 9.0 8.7* 8.8*   Liver Function Tests:  Recent Labs Lab 11/20/15 1250  AST 20  ALT 21  ALKPHOS 78  BILITOT 0.6  PROT 6.2*  ALBUMIN 3.5   No results for input(s): LIPASE, AMYLASE in the last 168 hours. No results for input(s): AMMONIA in the last 168 hours. CBC:  Recent Labs Lab 11/20/15 1250 11/21/15 0545 11/22/15 0429  WBC 5.6 5.8 5.9  HGB 15.1 15.2 15.2  HCT 43.3 44.3 45.1  MCV 86.6 87.4 88.4  PLT 180 187 201   Cardiac Enzymes:  Recent Labs Lab 11/20/15 1730 11/20/15 2012 11/20/15 2312 11/21/15 0545  TROPONINI 0.03 <0.03 <0.03 <0.03   BNP: BNP (last 3 results)  Recent Labs  11/20/15 1250  BNP 88.6    ProBNP (last 3 results) No results for input(s): PROBNP in the last 8760 hours.  CBG:  Recent Labs Lab 11/21/15 1148 11/21/15 1712 11/21/15 2057 11/22/15 0752  11/22/15 1147  GLUCAP 238* 306* 235* 322* 79       Signed:  Neema Barreira  [facility] 11/22/2015, 2:26 PM

## 2015-11-22 NOTE — Progress Notes (Signed)
Patient ID: Calvin [name], male   DOB: Mar 29, 1961, 55 y.o.   MRN: [id number]    Subjective: No pain "need more rest"  Objective: Vital signs in last 24 hours: Temp:  [97.8 F (36.6 C)-98.3 F (36.8 C)] 98.2 F (36.8 C) (05/21 0625) Pulse Rate:  [85-91] 85 (05/21 0625) Resp:  [18-20] 18 (05/21 0625) BP: (132-149)/(77-85) 135/77 mmHg (05/21 0625) SpO2:  [98 %-100 %] 98 % (05/21 0625) Weight:  [75.569 kg (166 lb 9.6 oz)] 75.569 kg (166 lb 9.6 oz) (05/21 0625) Last BM Date:  (can't recall last BM)  Intake/Output from previous day: 05/20 0701 - 05/21 0700 In: 933.5 [P.O.:840; I.V.:93.5] Out: 525 [Urine:525] Intake/Output this shift:    Medications Scheduled Meds: . aspirin  324 mg Oral Once  . aspirin EC  81 mg Oral Daily  . atorvastatin  40 mg Oral Daily  . carvedilol  3.125 mg Oral BID WC  . insulin aspart  0-15 Units Subcutaneous TID WC  . insulin aspart  3 Units Subcutaneous TID WC  . insulin detemir  12 Units Subcutaneous BID  . pantoprazole  40 mg Oral Daily   Continuous Infusions: . heparin 850 Units/hr (11/21/15 1924)   PRN Meds:.acetaminophen, albuterol, HYDROcodone-acetaminophen, nitroGLYCERIN, ondansetron (ZOFRAN) IV, promethazine  PE:  Well nourished, well developed, in no acute distress HEENT: Pupils are equal round react to light accommodation extraocular movements are intact.  Neck: no JVDNo cervical lymphadenopathy. Cardiac: Regular rate and rhythm without murmurs rubs or gallops. Lungs:  clear to auscultation bilaterally, no wheezing, rhonchi or rales Ext: no lower extremity edema.  2+ radial and dorsalis pedis pulses. Skin: warm and dry Neuro:  Grossly normal  Lab Results:   Recent Labs  11/20/15 1250 11/21/15 0545 11/22/15 0429  WBC 5.6 5.8 5.9  HGB 15.1 15.2 15.2  HCT 43.3 44.3 45.1  PLT 180 187 201   BMET  Recent Labs  11/20/15 1250 11/21/15 0545 11/22/15 0429  NA 138 137 136  K 3.5 3.7 3.9  CL 106 102 102  CO2 23 25 26     GLUCOSE 202* 256* 298*  BUN 11 11 12   CREATININE 1.03 1.14 1.23  CALCIUM 9.0 8.7* 8.8*   PT/INR  Recent Labs  11/20/15 1250  LABPROT 14.3  INR 1.09   Cholesterol  Recent Labs  11/21/15 0545  CHOL 221*   Lipid Panel     Component Value Date/Time   CHOL 221* 11/21/2015 0545   TRIG 135 11/21/2015 0545   HDL 45 11/21/2015 0545   CHOLHDL 4.9 11/21/2015 0545   VLDL 27 11/21/2015 0545   LDLCALC 149* 11/21/2015 0545   Cardiac Panel (last 3 results)  Recent Labs  11/20/15 2012 11/20/15 2312 11/21/15 0545  TROPONINI <0.03 <0.03 <0.03    Assessment/Plan  Active Problems:   Chest pain   DM (diabetes mellitus) ([facility]CC)   HLD (hyperlipidemia)  Chest Pain: R/O lateral ST changes ? From LVH.  Reviewed myovue and no ischemia or infarct EF 30%.  However review of echo shows 40% DCM:  Likely HTN DCM add ACE to coreg will arrange outpatient f/u with Dr Recardo Evangelist HTN;  Improved adding ACE due to DCM and DM DM:  Discussed low carb diet.  Target hemoglobin A1c is 6.5 or less.  Continue current medications.  Ok to d/c latter today at discretion of primary service  Jenkins Rouge

## 2015-11-22 NOTE — Progress Notes (Addendum)
ANTICOAGULATION CONSULT NOTE - Follow-up Consult  Pharmacy Consult for heparin Indication: chest pain/ACS  No Known Allergies  Patient Measurements: Height: 5' 9"  (175.3 cm) Weight: 166 lb 9.6 oz (75.569 kg) IBW/kg (Calculated) : 70.7 Heparin Dosing Weight: 73.8 kg  Vital Signs: Temp: 98.2 F (36.8 C) (05/21 0625) BP: 135/77 mmHg (05/21 0625) Pulse Rate: 85 (05/21 0625)  Labs:  Recent Labs  11/20/15 1250  11/20/15 2012 11/20/15 2312 11/20/15 2314 11/21/15 0545 11/22/15 0429  HGB 15.1  --   --   --   --  15.2 15.2  HCT 43.3  --   --   --   --  44.3 45.1  PLT 180  --   --   --   --  187 201  LABPROT 14.3  --   --   --   --   --   --   INR 1.09  --   --   --   --   --   --   HEPARINUNFRC  --   --   --   --  0.38 0.54 0.40  CREATININE 1.03  --   --   --   --  1.14 1.23  TROPONINI  --   < > <0.03 <0.03  --  <0.03  --   < > = values in this interval not displayed.  Estimated Creatinine Clearance: 67.9 mL/min (by C-G formula based on Cr of 1.23).   Assessment: 55 yo male on heparin for r/o ACS. Heparin level therapeutic on 850 units/hr. No bleeding noted. Pending lexiscan. ECHO today.  Goal of Therapy:  Heparin level 0.3-0.7 units/ml Monitor platelets by anticoagulation protocol: Yes   Plan:   Continue heparin 850 units/hr Daily HL and CBC  Onnie Boer, PharmD Pager: [phone] 11/22/2015 10:22 AM

## 2015-11-23 LAB — HEMOGLOBIN A1C
Hgb A1c MFr Bld: 13.1 % — ABNORMAL HIGH (ref 4.8–5.6)
Mean Plasma Glucose: 329 mg/dL

## 2016-01-26 ENCOUNTER — Emergency Department (HOSPITAL_COMMUNITY): Payer: Self-pay

## 2016-01-26 ENCOUNTER — Emergency Department (HOSPITAL_COMMUNITY)
Admission: EM | Admit: 2016-01-26 | Discharge: 2016-01-27 | Disposition: A | Discharge status: Home / Self Care | Payer: Self-pay | Attending: Emergency Medicine | Admitting: Emergency Medicine

## 2016-01-26 ENCOUNTER — Encounter (HOSPITAL_COMMUNITY): Payer: Self-pay | Admitting: Emergency Medicine

## 2016-01-26 DIAGNOSIS — Z794 Long term (current) use of insulin: Secondary | ICD-10-CM | POA: Insufficient documentation

## 2016-01-26 DIAGNOSIS — R739 Hyperglycemia, unspecified: Secondary | ICD-10-CM

## 2016-01-26 DIAGNOSIS — E1165 Type 2 diabetes mellitus with hyperglycemia: Secondary | ICD-10-CM | POA: Insufficient documentation

## 2016-01-26 DIAGNOSIS — R0602 Shortness of breath: Secondary | ICD-10-CM

## 2016-01-26 DIAGNOSIS — Z7982 Long term (current) use of aspirin: Secondary | ICD-10-CM | POA: Insufficient documentation

## 2016-01-26 DIAGNOSIS — I110 Hypertensive heart disease with heart failure: Secondary | ICD-10-CM | POA: Insufficient documentation

## 2016-01-26 DIAGNOSIS — I5022 Chronic systolic (congestive) heart failure: Secondary | ICD-10-CM | POA: Insufficient documentation

## 2016-01-26 LAB — URINE MICROSCOPIC-ADD ON: RBC / HPF: NONE SEEN RBC/hpf (ref 0–5)

## 2016-01-26 LAB — CBC
HCT: 45.7 % (ref 39.0–52.0)
Hemoglobin: 15.6 g/dL (ref 13.0–17.0)
MCH: 30.6 pg (ref 26.0–34.0)
MCHC: 34.1 g/dL (ref 30.0–36.0)
MCV: 89.6 fL (ref 78.0–100.0)
Platelets: 229 10*3/uL (ref 150–400)
RBC: 5.1 MIL/uL (ref 4.22–5.81)
RDW: 12.1 % (ref 11.5–15.5)
WBC: 4.7 10*3/uL (ref 4.0–10.5)

## 2016-01-26 LAB — BASIC METABOLIC PANEL
Anion gap: 6 (ref 5–15)
BUN: 15 mg/dL (ref 6–20)
CO2: 26 mmol/L (ref 22–32)
Calcium: 8.9 mg/dL (ref 8.9–10.3)
Chloride: 103 mmol/L (ref 101–111)
Creatinine, Ser: 1.42 mg/dL — ABNORMAL HIGH (ref 0.61–1.24)
GFR calc Af Amer: >60 mL/min (ref >60)
GFR calc non Af Amer: 54 mL/min — ABNORMAL LOW (ref >60)
Glucose, Bld: 272 mg/dL — ABNORMAL HIGH (ref 65–99)
Potassium: 3.7 mmol/L (ref 3.5–5.1)
Sodium: 135 mmol/L (ref 135–145)

## 2016-01-26 LAB — URINALYSIS, ROUTINE W REFLEX MICROSCOPIC
Glucose, UA: 500 mg/dL — AB
Hgb urine dipstick: NEGATIVE
Ketones, ur: 15 mg/dL — AB
Nitrite: NEGATIVE
Protein, ur: 30 mg/dL — AB
Specific Gravity, Urine: 1.034 — ABNORMAL HIGH (ref 1.005–1.030)
pH: 5 (ref 5.0–8.0)

## 2016-01-26 LAB — CBG MONITORING, ED: Glucose-Capillary: 270 mg/dL — ABNORMAL HIGH (ref 65–99)

## 2016-01-26 LAB — TROPONIN I: Troponin I: <0.03 ng/mL (ref <0.03)

## 2016-01-26 MED ORDER — SODIUM CHLORIDE 0.9 % IV BOLUS (SEPSIS)
1000.0000 mL | Freq: Once | INTRAVENOUS | Status: AC
  Administered 2016-01-26: 1000 mL via INTRAVENOUS

## 2016-01-26 MED ORDER — ALBUTEROL SULFATE HFA 108 (90 BASE) MCG/ACT IN AERS
2.0000 | INHALATION_SPRAY | Freq: Once | RESPIRATORY_TRACT | Status: AC
  Administered 2016-01-26: 2 via RESPIRATORY_TRACT
  Filled 2016-01-26: qty 6.7

## 2016-01-26 MED ORDER — IPRATROPIUM-ALBUTEROL 0.5-2.5 (3) MG/3ML IN SOLN
3.0000 mL | Freq: Once | RESPIRATORY_TRACT | Status: AC
  Administered 2016-01-26: 3 mL via RESPIRATORY_TRACT
  Filled 2016-01-26: qty 3

## 2016-01-26 NOTE — ED Triage Notes (Signed)
Patient reports elevated blood sugar today with fatigue , SOB and chest tightness . Denies emesis or fever .

## 2016-01-26 NOTE — ED Notes (Signed)
Patient ambulated in the hall sats stayed at 99% on [employer]

## 2016-01-26 NOTE — Discharge Instructions (Signed)
Use an inhaler, 2 puffs every 4-6 hours, as needed for shortness of breath and/or cough. Continue taking your daily medications. Follow up with a primary care doctor regarding your visit today. Return to the ED, as needed, for worsening symptoms.

## 2016-01-26 NOTE — ED Provider Notes (Signed)
[place] DEPT Provider Note   CSN: [id number] Arrival date & time: 01/26/16  2012  First Provider Contact:  First MD Initiated Contact with Patient 01/26/16 2133        History   Chief Complaint Chief Complaint  Patient presents with  . Hyperglycemia  . Fatigue  . Shortness of Breath    HPI [name] is a 55 y.o. male.  55 year old male with a history of diabetes mellitus, hypertension, dyslipidemia presents to the emergency department for multiple complaints. He reports a generalized feeling of being unwell and fatigued which began yesterday. He believes that this may be due to elevated blood sugar. He states that his CBG is usually between 200-300. He states that he has been compliant with his insulin regimen. Patient also complaining of intermittent shortness of breath with associated chest tightness. He denies any modifying factors of his symptoms, stating that they will happen at rest or with activity. He denies worsening of his symptoms with activity, specifically. Patient denies taking any medications for his symptoms. He believes that he may have been wheezing some, but denies a history of asthma. He denies sick contacts, other upper respiratory symptoms, nausea, vomiting, and fever.  Patient with hx of hospitalization for chest pain in May 2017. He states that his chest tightness and shortness of breath today feel different than the symptoms he experienced during this admission. Per d/c summary, "He underwent a stress test on 5/20 which showed no reversible ischemia, however he did show global hypokinesis and depressed ejection fraction. He underwent a 2-D echo on 5/21 which showed an ejection fraction of 40% with diffuse hypokinesis, and grade 1 diastolic dysfunction. Patient had no edema, and no other signs of fluid overload, his heart failure appears to be chronic in nature".      Past Medical History:  Diagnosis Date  . Arthritis   . Diabetes mellitus   .  Heart attack ([place])    While living in [place]  [name] Hyperlipidemia   . Hypertension   . Sleep apnea    cpap    Patient Active Problem List   Diagnosis Date Noted  . Chronic systolic heart failure ([place]) 11/22/2015  . Essential hypertension 11/22/2015  . Chest pain 11/20/2015  . DM (diabetes mellitus) ([place]) 11/20/2015  . HLD (hyperlipidemia) 11/20/2015    Past Surgical History:  Procedure Laterality Date  . COLONOSCOPY  05/13/11   9 adenomas  . FOREARM SURGERY    . MUSCLE BIOPSY         Home Medications    Prior to Admission medications   Medication Sig Start Date End Date Taking? Authorizing Provider  amitriptyline (ELAVIL) 25 MG tablet Take 25 mg by mouth at bedtime.   Yes Historical Provider, MD  aspirin EC 81 MG tablet Take 81 mg by mouth daily.   Yes Historical Provider, MD  atorvastatin (LIPITOR) 40 MG tablet Take 40 mg by mouth daily.    Yes Historical Provider, MD  carvedilol (COREG) 3.125 MG tablet Take 1 tablet (3.125 mg total) by mouth 2 (two) times daily with a meal. 11/22/15  Yes Costin Karlyne Greenspan, MD  glucose blood test strip 1 each by Other route See admin instructions. Check blood sugar 3 times daily.   Yes Historical Provider, MD  insulin aspart (NOVOLOG) 100 UNIT/ML injection Inject 30-45 Units into the skin 3 (three) times daily before meals. Per sliding scale   Yes Historical Provider, MD  insulin detemir (LEVEMIR) 100 UNIT/ML injection Inject 6-8 Units  into the skin 2 (two) times daily.    Yes Historical Provider, MD  lisinopril (PRINIVIL,ZESTRIL) 10 MG tablet Take 1 tablet (10 mg total) by mouth daily. 11/22/15  Yes Costin Karlyne Greenspan, MD  nitroGLYCERIN (NITROSTAT) 0.4 MG SL tablet Place 0.4 mg under the tongue every 5 (five) minutes as needed for chest pain.   Yes Historical Provider, MD  omeprazole (PRILOSEC) 20 MG capsule Take 1 capsule (20 mg total) by mouth daily. Patient not taking: Reported on 01/26/2016 01/17/14   Jola Schmidt, MD  promethazine (PHENERGAN) 25  MG tablet Take 1 tablet (25 mg total) by mouth every 6 (six) hours as needed for nausea or vomiting. Patient not taking: Reported on 01/26/2016 01/17/14   Jola Schmidt, MD    Family History Family History  Problem Relation Age of Onset  . Heart disease Mother   . Heart attack Mother 26  . Hypertension Mother   . Hyperlipidemia Mother   . Heart disease Father   . Heart attack Father 15  . Hypertension Father   . Hyperlipidemia Father   . Heart attack Sister 39  . Colon cancer Neg Hx   . Stomach cancer Neg Hx     Social History Social History  Substance Use Topics  . Smoking status: Never Smoker  . Smokeless tobacco: Not on file  . Alcohol use No     Allergies   Review of patient's allergies indicates no known allergies.   Review of Systems Review of Systems  Constitutional: Positive for fatigue.  Respiratory: Positive for chest tightness and shortness of breath.   Gastrointestinal: Negative for nausea and vomiting.  Neurological: Positive for weakness (generalized).  All other systems reviewed and are negative.    Physical Exam Updated Vital Signs BP 123/71   Pulse 90   Temp 98.9 F (37.2 C) (Oral)   Resp 22   SpO2 99%   Physical Exam  Constitutional: He is oriented to person, place, and time. He appears well-developed and well-nourished. No distress.  Nontoxic/nonseptic appearing  HENT:  Head: Normocephalic and atraumatic.  Mildly dry mucous membranes  Eyes: Conjunctivae and EOM are normal. No scleral icterus.  Neck: Normal range of motion.  No JVD  Cardiovascular: Normal rate, regular rhythm and intact distal pulses.   Pulmonary/Chest: Effort normal. No respiratory distress. He has no wheezes. He has no rales.  Respirations even and unlabored  Abdominal: Soft. He exhibits no distension. There is no tenderness. There is no guarding.  Soft, nontender, nondistended abdomen.  Musculoskeletal: Normal range of motion.  No edema in bilateral lower  extremities  Neurological: He is alert and oriented to person, place, and time.  Skin: Skin is warm and dry. No rash noted. He is not diaphoretic. No erythema. No pallor.  Psychiatric: He has a normal mood and affect. His behavior is normal.  Nursing note and vitals reviewed.    ED Treatments / Results  Labs (all labs ordered are listed, but only abnormal results are displayed) Labs Reviewed  BASIC METABOLIC PANEL - Abnormal; Notable for the following:       Result Value   Glucose, Bld 272 (*)    Creatinine, Ser 1.42 (*)    GFR calc non Af Amer 54 (*)    All other components within normal limits  URINALYSIS, ROUTINE W REFLEX MICROSCOPIC (NOT AT [facility]) - Abnormal; Notable for the following:    Color, Urine AMBER (*)    APPearance CLOUDY (*)    Specific Gravity, Urine 1.034 (*)  Glucose, UA 500 (*)    Bilirubin Urine SMALL (*)    Ketones, ur 15 (*)    Protein, ur 30 (*)    Leukocytes, UA SMALL (*)    All other components within normal limits  URINE MICROSCOPIC-ADD ON - Abnormal; Notable for the following:    Squamous Epithelial / LPF 0-5 (*)    Bacteria, UA RARE (*)    Casts HYALINE CASTS (*)    All other components within normal limits  CBG MONITORING, ED - Abnormal; Notable for the following:    Glucose-Capillary 270 (*)    All other components within normal limits  CBC  TROPONIN I  I-STAT TROPOININ, ED    EKG  EKG Interpretation None       Radiology Dg Chest 2 View  Result Date: 01/26/2016 CLINICAL DATA:  Shortness of breath and chest tightness for 2 days EXAM: CHEST  2 VIEW COMPARISON:  11/20/2015 FINDINGS: The heart size and mediastinal contours are within normal limits. Both lungs are clear. The visualized skeletal structures are unremarkable. IMPRESSION: No active cardiopulmonary disease. Electronically Signed   By: Inez Catalina M.D.   On: 01/26/2016 21:15   Procedures Procedures (including critical care time)  Medications Ordered in ED Medications    sodium chloride 0.9 % bolus 1,000 mL (0 mLs Intravenous Stopped 01/26/16 2347)  ipratropium-albuterol (DUONEB) 0.5-2.5 (3) MG/3ML nebulizer solution 3 mL (3 mLs Nebulization Given 01/26/16 2219)  albuterol (PROVENTIL HFA;VENTOLIN HFA) 108 (90 Base) MCG/ACT inhaler 2 puff (2 puffs Inhalation Given 01/26/16 2351)     Initial Impression / Assessment and Plan / ED Course  I have reviewed the triage vital signs and the nursing notes.  Pertinent labs & imaging results that were available during my care of the patient were reviewed by me and considered in my medical decision making (see chart for details).  Clinical Course    55 year old male presents to the emergency department for evaluation of fatigue. He also has complaints of shortness of breath and chest tightness. Symptoms have been persistent since yesterday. Shortness of breath and chest tightness have been intermittent and without modifying factors. Patient denies shortness of breath being aggravated with activity or exercise. Patient has a history of hospitalization in May for evaluation of chest pain. He had an echo at this time which showed chronic hypokinesis. No concern for MI during this evaluation.  Patient today with a reassuring workup. He is noted to be hyperglycemic, but this is fairly consistent with his baseline from prior ED visits. No evidence of DKA. Normal anion gap. Cardiac workup performed. Troponin is negative and chest x-ray without any evidence of acute cardiopulmonary changes. EKG is largely unchanged from prior. No evidence of STEMI.  Shortness of breath and chest tightness have improved with an albuterol treatment. Patient also hydrated with IV fluids. He states that he is feeling much better. Patient ambulatory in the ED without hypoxia and without complaints of chest pain. He states that he is ready for discharge. I have recommended that he follow up with a primary care doctor regarding his visit today. Albuterol  inhaler given for symptom management. Return precautions discussed and provided. Patient discharged in satisfactory condition with no unaddressed concerns.   Final Clinical Impressions(s) / ED Diagnoses   Final diagnoses:  Hyperglycemia  Shortness of breath    New Prescriptions New Prescriptions   No medications on file     Antonietta Breach, [name] 01/27/16 0018    Charlesetta Shanks, MD 01/27/16 2308

## 2016-01-27 NOTE — ED Notes (Signed)
Patient Alert and oriented X4. Stable and ambulatory. Patient verbalized understanding of the discharge instructions.  Patient belongings were taken by the patient.  

## 2016-03-07 ENCOUNTER — Encounter (HOSPITAL_COMMUNITY): Payer: Self-pay | Admitting: Emergency Medicine

## 2016-03-07 DIAGNOSIS — E119 Type 2 diabetes mellitus without complications: Secondary | ICD-10-CM | POA: Insufficient documentation

## 2016-03-07 DIAGNOSIS — Z7982 Long term (current) use of aspirin: Secondary | ICD-10-CM | POA: Insufficient documentation

## 2016-03-07 DIAGNOSIS — I5022 Chronic systolic (congestive) heart failure: Secondary | ICD-10-CM | POA: Insufficient documentation

## 2016-03-07 DIAGNOSIS — Z79899 Other long term (current) drug therapy: Secondary | ICD-10-CM | POA: Insufficient documentation

## 2016-03-07 DIAGNOSIS — I110 Hypertensive heart disease with heart failure: Secondary | ICD-10-CM | POA: Insufficient documentation

## 2016-03-07 DIAGNOSIS — L03115 Cellulitis of right lower limb: Secondary | ICD-10-CM | POA: Insufficient documentation

## 2016-03-07 DIAGNOSIS — Z794 Long term (current) use of insulin: Secondary | ICD-10-CM | POA: Insufficient documentation

## 2016-03-07 LAB — CBC WITH DIFFERENTIAL/PLATELET
Basophils Absolute: 0 10*3/uL (ref 0.0–0.1)
Basophils Relative: 0 %
Eosinophils Absolute: 0.1 10*3/uL (ref 0.0–0.7)
Eosinophils Relative: 1 %
HCT: 43.7 % (ref 39.0–52.0)
Hemoglobin: 14.6 g/dL (ref 13.0–17.0)
Lymphocytes Relative: 36 %
Lymphs Abs: 2.2 10*3/uL (ref 0.7–4.0)
MCH: 30.1 pg (ref 26.0–34.0)
MCHC: 33.4 g/dL (ref 30.0–36.0)
MCV: 90.1 fL (ref 78.0–100.0)
Monocytes Absolute: 0.4 10*3/uL (ref 0.1–1.0)
Monocytes Relative: 6 %
Neutro Abs: 3.5 10*3/uL (ref 1.7–7.7)
Neutrophils Relative %: 57 %
Platelets: 249 10*3/uL (ref 150–400)
RBC: 4.85 MIL/uL (ref 4.22–5.81)
RDW: 12.2 % (ref 11.5–15.5)
WBC: 6.2 10*3/uL (ref 4.0–10.5)

## 2016-03-07 LAB — COMPREHENSIVE METABOLIC PANEL
ALT: 15 U/L — ABNORMAL LOW (ref 17–63)
AST: 17 U/L (ref 15–41)
Albumin: 3.9 g/dL (ref 3.5–5.0)
Alkaline Phosphatase: 81 U/L (ref 38–126)
Anion gap: 6 (ref 5–15)
BUN: 14 mg/dL (ref 6–20)
CO2: 23 mmol/L (ref 22–32)
Calcium: 9.2 mg/dL (ref 8.9–10.3)
Chloride: 104 mmol/L (ref 101–111)
Creatinine, Ser: 1.29 mg/dL — ABNORMAL HIGH (ref 0.61–1.24)
GFR calc Af Amer: >60 mL/min (ref >60)
GFR calc non Af Amer: >60 mL/min (ref >60)
Glucose, Bld: 357 mg/dL — ABNORMAL HIGH (ref 65–99)
Potassium: 3.7 mmol/L (ref 3.5–5.1)
Sodium: 133 mmol/L — ABNORMAL LOW (ref 135–145)
Total Bilirubin: 0.4 mg/dL (ref 0.3–1.2)
Total Protein: 6.9 g/dL (ref 6.5–8.1)

## 2016-03-07 NOTE — ED Triage Notes (Signed)
Pt. reports wound infections with itching/" burning" for 2 weeks at right posterior lower leg with no drainage . Denies fever or chills.

## 2016-03-08 ENCOUNTER — Emergency Department (HOSPITAL_COMMUNITY)
Admission: EM | Admit: 2016-03-08 | Discharge: 2016-03-08 | Disposition: A | Discharge status: Home / Self Care | Payer: Self-pay | Attending: Emergency Medicine | Admitting: Emergency Medicine

## 2016-03-08 DIAGNOSIS — L03115 Cellulitis of right lower limb: Secondary | ICD-10-CM

## 2016-03-08 MED ORDER — CEPHALEXIN 250 MG PO CAPS
500.0000 mg | ORAL_CAPSULE | Freq: Once | ORAL | Status: AC
  Administered 2016-03-08: 500 mg via ORAL
  Filled 2016-03-08: qty 2

## 2016-03-08 MED ORDER — SULFAMETHOXAZOLE-TRIMETHOPRIM 800-160 MG PO TABS: 1.0000 | ORAL_TABLET | Freq: Two times a day (BID) | ORAL | 0 refills | Status: AC

## 2016-03-08 MED ORDER — SULFAMETHOXAZOLE-TRIMETHOPRIM 800-160 MG PO TABS
1.0000 | ORAL_TABLET | Freq: Once | ORAL | Status: AC
  Administered 2016-03-08: 1 via ORAL
  Filled 2016-03-08: qty 1

## 2016-03-08 MED ORDER — CEPHALEXIN 500 MG PO CAPS: 500.0000 mg | ORAL_CAPSULE | Freq: Four times a day (QID) | ORAL | 0 refills | Status: DC

## 2016-03-08 NOTE — ED Provider Notes (Signed)
[place] DEPT Provider Note   CSN: [id number] Arrival date & time: 03/07/16  2149     History   Chief Complaint Chief Complaint  Patient presents with  . Cellulitis    HPI Calvin [name] is a 55 y.o. male.  Patient presents to the ED with a chief complaint of sores on right leg.  He states that the sores have been there for the past 3-4 weeks. He states that he was told to have the wounds looked at because he is diabetic.  He has not had a fever, chills, nausea, or vomiting.  He states that the wounds are very itchy, but not painful.  He denies any bug bites or injuries.  Denies taking anything for his symptoms.   The history is provided by the patient. No language interpreter was used.    Past Medical History:  Diagnosis Date  . Arthritis   . Diabetes mellitus   . Heart attack ([place])    While living in [place]  [name] Hyperlipidemia   . Hypertension   . Sleep apnea    cpap    Patient Active Problem List   Diagnosis Date Noted  . Chronic systolic heart failure ([place]) 11/22/2015  . Essential hypertension 11/22/2015  . Chest pain 11/20/2015  . DM (diabetes mellitus) ([place]) 11/20/2015  . HLD (hyperlipidemia) 11/20/2015    Past Surgical History:  Procedure Laterality Date  . COLONOSCOPY  05/13/11   9 adenomas  . FOREARM SURGERY    . MUSCLE BIOPSY         Home Medications    Prior to Admission medications   Medication Sig Start Date End Date Taking? Authorizing Provider  amitriptyline (ELAVIL) 25 MG tablet Take 25 mg by mouth at bedtime.    Historical Provider, MD  aspirin EC 81 MG tablet Take 81 mg by mouth daily.    Historical Provider, MD  atorvastatin (LIPITOR) 40 MG tablet Take 40 mg by mouth daily.     Historical Provider, MD  carvedilol (COREG) 3.125 MG tablet Take 1 tablet (3.125 mg total) by mouth 2 (two) times daily with a meal. 11/22/15   Costin Karlyne Greenspan, MD  glucose blood test strip 1 each by Other route See admin instructions. Check blood sugar 3  times daily.    Historical Provider, MD  insulin aspart (NOVOLOG) 100 UNIT/ML injection Inject 30-45 Units into the skin 3 (three) times daily before meals. Per sliding scale    Historical Provider, MD  insulin detemir (LEVEMIR) 100 UNIT/ML injection Inject 6-8 Units into the skin 2 (two) times daily.     Historical Provider, MD  lisinopril (PRINIVIL,ZESTRIL) 10 MG tablet Take 1 tablet (10 mg total) by mouth daily. 11/22/15   Costin Karlyne Greenspan, MD  nitroGLYCERIN (NITROSTAT) 0.4 MG SL tablet Place 0.4 mg under the tongue every 5 (five) minutes as needed for chest pain.    Historical Provider, MD  omeprazole (PRILOSEC) 20 MG capsule Take 1 capsule (20 mg total) by mouth daily. Patient not taking: Reported on 01/26/2016 01/17/14   Jola Schmidt, MD  promethazine (PHENERGAN) 25 MG tablet Take 1 tablet (25 mg total) by mouth every 6 (six) hours as needed for nausea or vomiting. Patient not taking: Reported on 01/26/2016 01/17/14   Jola Schmidt, MD    Family History Family History  Problem Relation Age of Onset  . Heart disease Mother   . Heart attack Mother 51  . Hypertension Mother   . Hyperlipidemia Mother   . Heart disease  Father   . Heart attack Father 47  . Hypertension Father   . Hyperlipidemia Father   . Heart attack Sister 19  . Colon cancer Neg Hx   . Stomach cancer Neg Hx     Social History Social History  Substance Use Topics  . Smoking status: Never Smoker  . Smokeless tobacco: Not on file  . Alcohol use No     Allergies   Review of patient's allergies indicates no known allergies.   Review of Systems Review of Systems  All other systems reviewed and are negative.    Physical Exam Updated Vital Signs BP 134/78 (BP Location: Left Arm)   Pulse 105   Temp 98.3 F (36.8 C) (Oral)   Resp 20   Ht 5' 6"  (1.676 m)   Wt 73.9 kg   SpO2 98%   BMI 26.31 kg/m   Physical Exam  Constitutional: He is oriented to person, place, and time. He appears well-developed and  well-nourished.  HENT:  Head: Normocephalic and atraumatic.  Eyes: Conjunctivae and EOM are normal. Pupils are equal, round, and reactive to light. Right eye exhibits no discharge. Left eye exhibits no discharge. No scleral icterus.  Neck: Normal range of motion. Neck supple. No JVD present.  Cardiovascular: Normal rate, regular rhythm and normal heart sounds.  Exam reveals no gallop and no friction rub.   No murmur heard. Pulmonary/Chest: Effort normal and breath sounds normal. No respiratory distress. He has no wheezes. He has no rales. He exhibits no tenderness.  Abdominal: Soft. He exhibits no distension and no mass. There is no tenderness. There is no rebound and no guarding.  Musculoskeletal: Normal range of motion. He exhibits no edema or tenderness.  Neurological: He is alert and oriented to person, place, and time.  Skin: Skin is warm and dry.  As pictured below  Psychiatric: He has a normal mood and affect. His behavior is normal. Judgment and thought content normal.  Nursing note and vitals reviewed.      ED Treatments / Results  Labs (all labs ordered are listed, but only abnormal results are displayed) Labs Reviewed  COMPREHENSIVE METABOLIC PANEL - Abnormal; Notable for the following:       Result Value   Sodium 133 (*)    Glucose, Bld 357 (*)    Creatinine, Ser 1.29 (*)    ALT 15 (*)    All other components within normal limits  CBC WITH DIFFERENTIAL/PLATELET    EKG  EKG Interpretation None       Radiology No results found.  Procedures Procedures (including critical care time)  Medications Ordered in ED Medications - No data to display   Initial Impression / Assessment and Plan / ED Course  I have reviewed the triage vital signs and the nursing notes.  Pertinent labs & imaging results that were available during my care of the patient were reviewed by me and considered in my medical decision making (see chart for details).  Clinical Course     Patient seen by and discussed with Dr. Claudine Mouton, who agrees with plan for outpatient treatment.  Wound check in 3 days.  Patient understands and agrees with the plan.  He is stable and ready for discharge.  Final Clinical Impressions(s) / ED Diagnoses   Final diagnoses:  Cellulitis of right leg    New Prescriptions New Prescriptions   CEPHALEXIN (KEFLEX) 500 MG CAPSULE    Take 1 capsule (500 mg total) by mouth 4 (four) times daily.  SULFAMETHOXAZOLE-TRIMETHOPRIM (BACTRIM DS,SEPTRA DS) 800-160 MG TABLET    Take 1 tablet by mouth 2 (two) times daily.     Montine Circle, [name] 03/08/16 1025    Everlene Balls, MD 03/08/16 0530

## 2016-05-24 ENCOUNTER — Encounter (HOSPITAL_COMMUNITY): Payer: Self-pay

## 2016-05-24 ENCOUNTER — Emergency Department (HOSPITAL_COMMUNITY): Payer: Self-pay

## 2016-05-24 DIAGNOSIS — Z79899 Other long term (current) drug therapy: Secondary | ICD-10-CM | POA: Insufficient documentation

## 2016-05-24 DIAGNOSIS — R0789 Other chest pain: Secondary | ICD-10-CM | POA: Insufficient documentation

## 2016-05-24 DIAGNOSIS — E119 Type 2 diabetes mellitus without complications: Secondary | ICD-10-CM | POA: Insufficient documentation

## 2016-05-24 DIAGNOSIS — Z7982 Long term (current) use of aspirin: Secondary | ICD-10-CM | POA: Insufficient documentation

## 2016-05-24 DIAGNOSIS — I110 Hypertensive heart disease with heart failure: Secondary | ICD-10-CM | POA: Insufficient documentation

## 2016-05-24 DIAGNOSIS — Z794 Long term (current) use of insulin: Secondary | ICD-10-CM | POA: Insufficient documentation

## 2016-05-24 DIAGNOSIS — I252 Old myocardial infarction: Secondary | ICD-10-CM | POA: Insufficient documentation

## 2016-05-24 DIAGNOSIS — I5022 Chronic systolic (congestive) heart failure: Secondary | ICD-10-CM | POA: Insufficient documentation

## 2016-05-24 LAB — I-STAT TROPONIN, ED: Troponin i, poc: 0.02 ng/mL (ref 0.00–0.08)

## 2016-05-24 NOTE — ED Triage Notes (Signed)
Pt states 2 days ago he started having  Chest pain across entire chest; pt states pain has increased over 2 days; pt states he is having hot flashes as well; pt states pain at 10/10 on arrival. Pt a&ox 4 on arrival.

## 2016-05-25 ENCOUNTER — Encounter (HOSPITAL_COMMUNITY): Payer: Self-pay | Admitting: *Deleted

## 2016-05-25 ENCOUNTER — Emergency Department (HOSPITAL_COMMUNITY)
Admission: EM | Admit: 2016-05-25 | Discharge: 2016-05-25 | Disposition: A | Discharge status: Home / Self Care | Payer: Self-pay | Attending: Emergency Medicine | Admitting: Emergency Medicine

## 2016-05-25 DIAGNOSIS — R0789 Other chest pain: Secondary | ICD-10-CM

## 2016-05-25 LAB — BASIC METABOLIC PANEL
Anion gap: 10 (ref 5–15)
BUN: 8 mg/dL (ref 6–20)
CO2: 22 mmol/L (ref 22–32)
Calcium: 9.2 mg/dL (ref 8.9–10.3)
Chloride: 97 mmol/L — ABNORMAL LOW (ref 101–111)
Creatinine, Ser: 1.22 mg/dL (ref 0.61–1.24)
GFR calc Af Amer: >60 mL/min (ref >60)
GFR calc non Af Amer: >60 mL/min (ref >60)
Glucose, Bld: 512 mg/dL (ref 65–99)
Potassium: 4.1 mmol/L (ref 3.5–5.1)
Sodium: 129 mmol/L — ABNORMAL LOW (ref 135–145)

## 2016-05-25 LAB — CBC
HCT: 46.4 % (ref 39.0–52.0)
Hemoglobin: 16.1 g/dL (ref 13.0–17.0)
MCH: 30.6 pg (ref 26.0–34.0)
MCHC: 34.7 g/dL (ref 30.0–36.0)
MCV: 88 fL (ref 78.0–100.0)
Platelets: 200 10*3/uL (ref 150–400)
RBC: 5.27 MIL/uL (ref 4.22–5.81)
RDW: 12.3 % (ref 11.5–15.5)
WBC: 6.2 10*3/uL (ref 4.0–10.5)

## 2016-05-25 LAB — CBG MONITORING, ED
Glucose-Capillary: 347 mg/dL — ABNORMAL HIGH (ref 65–99)
Glucose-Capillary: 320 mg/dL — ABNORMAL HIGH (ref 65–99)

## 2016-05-25 LAB — I-STAT TROPONIN, ED: Troponin i, poc: 0 ng/mL (ref 0.00–0.08)

## 2016-05-25 MED ORDER — NAPROXEN 500 MG PO TABS: 500.0000 mg | ORAL_TABLET | Freq: Two times a day (BID) | ORAL | 0 refills | Status: DC

## 2016-05-25 MED ORDER — HYDROCODONE-ACETAMINOPHEN 5-325 MG PO TABS
1.0000 | ORAL_TABLET | Freq: Once | ORAL | Status: AC
  Administered 2016-05-25: 1 via ORAL
  Filled 2016-05-25: qty 1

## 2016-05-25 MED ORDER — SODIUM CHLORIDE 0.9 % IV BOLUS (SEPSIS): 1000.0000 mL | Freq: Once | INTRAVENOUS | Status: DC

## 2016-05-25 MED ORDER — NAPROXEN 250 MG PO TABS
500.0000 mg | ORAL_TABLET | Freq: Once | ORAL | Status: AC
  Administered 2016-05-25: 500 mg via ORAL
  Filled 2016-05-25: qty 2

## 2016-05-25 MED ORDER — INSULIN ASPART 100 UNIT/ML ~~LOC~~ SOLN: 10.0000 [IU] | Freq: Once | SUBCUTANEOUS | Status: DC

## 2016-05-25 MED ORDER — SODIUM CHLORIDE 0.9 % IV BOLUS (SEPSIS)
1000.0000 mL | Freq: Once | INTRAVENOUS | Status: AC
  Administered 2016-05-25: 1000 mL via INTRAVENOUS

## 2016-05-25 NOTE — ED Notes (Signed)
EDP at bedside  

## 2016-05-25 NOTE — ED Provider Notes (Signed)
[facility] DEPT Provider Note   CSN: [id number] Arrival date & time: 05/24/16  2315   By signing my name below, I, Dolores Hoose, attest that this documentation has been prepared under the direction and in the presence of Merryl Hacker, MD . Electronically Signed: Dolores Hoose, Scribe. 05/25/2016. 2:50 AM.   History   Chief Complaint Chief Complaint  Patient presents with  . Chest Pain   HPI  HPI Comments:  Calvin [name] is a 55 y.o. male with pmhx of MI, DM, HTN and HLDwho presents to the Emergency Department complaining of sudden-onset constant slightly worsening chest pain beginning 2 days ago. He describes his pain as 10/10 sharp pain that radiates down his left arm. Pt takes aspirin daily but his aspirin has not relieved his pain. He notes that he has had similar pain in the past.  Pt reports associated SOB, exacerbated by exertion. Reports compliance with insulin but has not taken insulin since yesterday afternoon.  Reports that his blood sugars are normally 300-400.  Patient had a stress test in May 2017. Considered high risk secondary to low EF; however, there was no inducible ischemia. He was due to follow up with cardiology. He has not done that.  Past Medical History:  Diagnosis Date  . Arthritis   . Diabetes mellitus   . Heart attack    While living in [place]  [name] Hyperlipidemia   . Hypertension   . Sleep apnea    cpap    Patient Active Problem List   Diagnosis Date Noted  . Chronic systolic heart failure ([place]) 11/22/2015  . Essential hypertension 11/22/2015  . Chest pain 11/20/2015  . DM (diabetes mellitus) ([place]) 11/20/2015  . HLD (hyperlipidemia) 11/20/2015    Past Surgical History:  Procedure Laterality Date  . COLONOSCOPY  05/13/11   9 adenomas  . FOREARM SURGERY    . MUSCLE BIOPSY         Home Medications    Prior to Admission medications   Medication Sig Start Date End Date Taking? Authorizing Provider  amitriptyline (ELAVIL) 25 MG  tablet Take 25 mg by mouth at bedtime.   Yes Historical Provider, MD  aspirin EC 81 MG tablet Take 81 mg by mouth daily.   Yes Historical Provider, MD  atorvastatin (LIPITOR) 40 MG tablet Take 40 mg by mouth daily.    Yes Historical Provider, MD  carvedilol (COREG) 3.125 MG tablet Take 1 tablet (3.125 mg total) by mouth 2 (two) times daily with a meal. 11/22/15  Yes Costin Karlyne Greenspan, MD  insulin aspart (NOVOLOG) 100 UNIT/ML injection Inject 30-45 Units into the skin 3 (three) times daily before meals. Per sliding scale   Yes Historical Provider, MD  insulin detemir (LEVEMIR) 100 UNIT/ML injection Inject 8 Units into the skin 2 (two) times daily.    Yes Historical Provider, MD  lisinopril (PRINIVIL,ZESTRIL) 10 MG tablet Take 1 tablet (10 mg total) by mouth daily. 11/22/15  Yes Costin Karlyne Greenspan, MD  nitroGLYCERIN (NITROSTAT) 0.4 MG SL tablet Place 0.4 mg under the tongue every 5 (five) minutes as needed for chest pain.   Yes Historical Provider, MD  cephALEXin (KEFLEX) 500 MG capsule Take 1 capsule (500 mg total) by mouth 4 (four) times daily. Patient not taking: Reported on 05/25/2016 03/08/16   Montine Circle, [name]  glucose blood test strip 1 each by Other route See admin instructions. Check blood sugar 3 times daily.    Historical Provider, MD  naproxen (NAPROSYN) 500 MG tablet  Take 1 tablet (500 mg total) by mouth 2 (two) times daily. Limit use to 5 days 05/25/16   Merryl Hacker, MD  omeprazole (PRILOSEC) 20 MG capsule Take 1 capsule (20 mg total) by mouth daily. Patient not taking: Reported on 05/25/2016 01/17/14   Jola Schmidt, MD  promethazine (PHENERGAN) 25 MG tablet Take 1 tablet (25 mg total) by mouth every 6 (six) hours as needed for nausea or vomiting. Patient not taking: Reported on 05/25/2016 01/17/14   Jola Schmidt, MD    Family History Family History  Problem Relation Age of Onset  . Heart disease Mother   . Heart attack Mother 26  . Hypertension Mother   . Hyperlipidemia Mother    . Heart disease Father   . Heart attack Father 56  . Hypertension Father   . Hyperlipidemia Father   . Heart attack Sister 70  . Colon cancer Neg Hx   . Stomach cancer Neg Hx     Social History Social History  Substance Use Topics  . Smoking status: Never Smoker  . Smokeless tobacco: Never Used  . Alcohol use No     Allergies   Patient has no known allergies.   Review of Systems Review of Systems  Constitutional: Negative for fever.  Respiratory: Positive for shortness of breath.   Cardiovascular: Positive for chest pain.  Neurological: Positive for dizziness.  All other systems reviewed and are negative.    Physical Exam Updated Vital Signs BP 146/87   Pulse 79   Temp 97.4 F (36.3 C) (Oral)   Resp 16   SpO2 100%   Physical Exam  Constitutional: He is oriented to person, place, and time. No distress.  HENT:  Head: Normocephalic and atraumatic.  Eyes: Pupils are equal, round, and reactive to light.  Cardiovascular: Normal rate, regular rhythm and normal heart sounds.   No murmur heard. Pulmonary/Chest: Effort normal and breath sounds normal. No respiratory distress. He has no wheezes. He exhibits tenderness.  Tenderness palpation anterior chest wall  Abdominal: Soft. Bowel sounds are normal. There is no tenderness. There is no rebound.  Musculoskeletal: He exhibits edema.  Trace bilateral lower extremity edema  Neurological: He is alert and oriented to person, place, and time.  Skin: Skin is warm and dry.  Psychiatric: He has a normal mood and affect.  Nursing note and vitals reviewed.    ED Treatments / Results  DIAGNOSTIC STUDIES:  Oxygen Saturation is 100% on Ra, Normal by my interpretation.    COORDINATION OF CARE:  3:00 AM Discussed treatment plan with pt at bedside which includes lab work and pt agreed to plan.  Labs (all labs ordered are listed, but only abnormal results are displayed) Labs Reviewed  BASIC METABOLIC PANEL - Abnormal;  Notable for the following:       Result Value   Sodium 129 (*)    Chloride 97 (*)    Glucose, Bld 512 (*)    All other components within normal limits  CBG MONITORING, ED - Abnormal; Notable for the following:    Glucose-Capillary 347 (*)    All other components within normal limits  CBG MONITORING, ED - Abnormal; Notable for the following:    Glucose-Capillary 320 (*)    All other components within normal limits  CBC  I-STAT TROPOININ, ED  Randolm Idol, ED    EKG  EKG Interpretation  Date/Time:  Tuesday May 24 2016 23:25:19 EST Ventricular Rate:  103 PR Interval:  162 QRS Duration: 84  QT Interval:  340 QTC Calculation: 445 R Axis:   33 Text Interpretation:  Sinus tachycardia T wave abnormality, consider lateral ischemia Abnormal ECG No significant change since last tracing Confirmed by Shalaine Payson  MD, Loma Sousa ([id number]) on 05/25/2016 3:04:57 AM       Radiology Dg Chest 2 View  Result Date: 05/24/2016 CLINICAL DATA:  55 y/o M; chest pain tonight across the chest with left arm pain. History of heart attack. EXAM: CHEST  2 VIEW COMPARISON:  01/26/2016 chest radiograph FINDINGS: The heart size and mediastinal contours are within normal limits and stable. Both lungs are clear. The visualized skeletal structures are unremarkable. IMPRESSION: No acute pulmonary process. Electronically Signed   By: Kristine Garbe M.D.   On: 05/24/2016 23:46    Procedures Procedures (including critical care time)  Medications Ordered in ED Medications  sodium chloride 0.9 % bolus 1,000 mL (0 mLs Intravenous Stopped 05/25/16 0609)  HYDROcodone-acetaminophen (NORCO/VICODIN) 5-325 MG per tablet 1 tablet (1 tablet Oral Given 05/25/16 0310)  naproxen (NAPROSYN) tablet 500 mg (500 mg Oral Given 05/25/16 0606)     Initial Impression / Assessment and Plan / ED Course  I have reviewed the triage vital signs and the nursing notes.  Pertinent labs & imaging results that were available  during my care of the patient were reviewed by me and considered in my medical decision making (see chart for details).  Clinical Course     Patient presents with chest pain. Ongoing for last 2 days. Very atypical. It is also reproducible. However, patient does have a history of reduced EF. No inducible ischemia on recent stress post. He has not followed up with cardiology. He is otherwise nontoxic. Blood sugar noted to be greater than 500. No anion gap. Patient was given 1 L of fluid. We'll hold further fluid resuscitation given known EF of 40%. Initial troponin and EKG are reassuring. Repeat troponin ordered at 4 hours. Patient was given Norco and naproxen for pain. On recheck, he does report some improvement. He continues to be reproducible. Repeat troponin is negative. Doubt ACS; however, patient will need close cardiology follow-up and I have encouraged him to see Dr. Sallyanne Kuster asap.    After history, exam, and medical workup I feel the patient has been appropriately medically screened and is safe for discharge home. Pertinent diagnoses were discussed with the patient. Patient was given return precautions.   Final Clinical Impressions(s) / ED Diagnoses   Final diagnoses:  Atypical chest pain    New Prescriptions New Prescriptions   NAPROXEN (NAPROSYN) 500 MG TABLET    Take 1 tablet (500 mg total) by mouth 2 (two) times daily. Limit use to 5 days   I personally performed the services described in this documentation, which was scribed in my presence. The recorded information has been reviewed and is accurate.     Merryl Hacker, MD 05/25/16 0630

## 2016-05-25 NOTE — Discharge Instructions (Signed)
You were seen today for chest pain. Your workup is reassuring including your heart tests. Because the pain is reproducible on exam or hurts when I touch, you may have a musculoskeletal component. However, given your history of low ejection fraction, it is very important that you follow-up with Dr. Sallyanne Kuster as above.

## 2016-07-15 LAB — BASIC METABOLIC PANEL
BUN: 12 (ref 4–21)
Creatinine: 1.2 (ref 0.6–1.3)
Glucose: 337
Potassium: 4.2 (ref 3.4–5.3)
Sodium: 132 — AB (ref 137–147)

## 2016-07-15 LAB — VITAMIN D 25 HYDROXY (VIT D DEFICIENCY, FRACTURES): Vit D, 25-Hydroxy: 14

## 2016-07-15 LAB — CBC AND DIFFERENTIAL
HCT: 48 (ref 41–53)
Hemoglobin: 16.2 (ref 13.5–17.5)
Platelets: 240 (ref 150–399)
WBC: 7.6

## 2016-07-15 LAB — LIPID PANEL
Cholesterol: 244 — AB (ref 0–200)
HDL: 54 (ref 35–70)
LDL Cholesterol: 166
Triglycerides: 119 (ref 40–160)

## 2016-07-15 LAB — HEPATIC FUNCTION PANEL
ALT: 15 (ref 10–40)
AST: 16 (ref 14–40)
Alkaline Phosphatase: 97 (ref 25–125)
Bilirubin, Total: 0.5

## 2016-07-15 LAB — HEMOGLOBIN A1C: Hemoglobin A1C: 13.1

## 2016-11-18 ENCOUNTER — Emergency Department (HOSPITAL_COMMUNITY)
Admission: EM | Admit: 2016-11-18 | Discharge: 2016-11-18 | Disposition: A | Discharge status: Home / Self Care | Payer: Self-pay | Attending: Emergency Medicine | Admitting: Emergency Medicine

## 2016-11-18 ENCOUNTER — Encounter (HOSPITAL_COMMUNITY): Payer: Self-pay

## 2016-11-18 ENCOUNTER — Emergency Department (HOSPITAL_COMMUNITY): Payer: Self-pay

## 2016-11-18 DIAGNOSIS — I5022 Chronic systolic (congestive) heart failure: Secondary | ICD-10-CM | POA: Insufficient documentation

## 2016-11-18 DIAGNOSIS — I110 Hypertensive heart disease with heart failure: Secondary | ICD-10-CM | POA: Insufficient documentation

## 2016-11-18 DIAGNOSIS — E1165 Type 2 diabetes mellitus with hyperglycemia: Secondary | ICD-10-CM | POA: Insufficient documentation

## 2016-11-18 DIAGNOSIS — Z794 Long term (current) use of insulin: Secondary | ICD-10-CM | POA: Insufficient documentation

## 2016-11-18 DIAGNOSIS — Z79899 Other long term (current) drug therapy: Secondary | ICD-10-CM | POA: Insufficient documentation

## 2016-11-18 DIAGNOSIS — Z7982 Long term (current) use of aspirin: Secondary | ICD-10-CM | POA: Insufficient documentation

## 2016-11-18 DIAGNOSIS — R0789 Other chest pain: Secondary | ICD-10-CM | POA: Insufficient documentation

## 2016-11-18 DIAGNOSIS — R739 Hyperglycemia, unspecified: Secondary | ICD-10-CM

## 2016-11-18 LAB — I-STAT TROPONIN, ED
Troponin i, poc: 0.01 ng/mL (ref 0.00–0.08)
Troponin i, poc: 0.01 ng/mL (ref 0.00–0.08)

## 2016-11-18 LAB — COMPREHENSIVE METABOLIC PANEL
ALT: 20 U/L (ref 17–63)
AST: 19 U/L (ref 15–41)
Albumin: 3.6 g/dL (ref 3.5–5.0)
Alkaline Phosphatase: 80 U/L (ref 38–126)
Anion gap: 8 (ref 5–15)
BUN: 11 mg/dL (ref 6–20)
CO2: 25 mmol/L (ref 22–32)
Calcium: 8.9 mg/dL (ref 8.9–10.3)
Chloride: 101 mmol/L (ref 101–111)
Creatinine, Ser: 1.14 mg/dL (ref 0.61–1.24)
GFR calc Af Amer: >60 mL/min (ref >60)
GFR calc non Af Amer: >60 mL/min (ref >60)
Glucose, Bld: 340 mg/dL — ABNORMAL HIGH (ref 65–99)
Potassium: 4.1 mmol/L (ref 3.5–5.1)
Sodium: 134 mmol/L — ABNORMAL LOW (ref 135–145)
Total Bilirubin: 0.7 mg/dL (ref 0.3–1.2)
Total Protein: 6.4 g/dL — ABNORMAL LOW (ref 6.5–8.1)

## 2016-11-18 LAB — CBC WITH DIFFERENTIAL/PLATELET
Basophils Absolute: 0 10*3/uL (ref 0.0–0.1)
Basophils Relative: 0 %
Eosinophils Absolute: 0.1 10*3/uL (ref 0.0–0.7)
Eosinophils Relative: 2 %
HCT: 44 % (ref 39.0–52.0)
Hemoglobin: 15.3 g/dL (ref 13.0–17.0)
Lymphocytes Relative: 33 %
Lymphs Abs: 1.7 10*3/uL (ref 0.7–4.0)
MCH: 30.8 pg (ref 26.0–34.0)
MCHC: 34.8 g/dL (ref 30.0–36.0)
MCV: 88.5 fL (ref 78.0–100.0)
Monocytes Absolute: 0.4 10*3/uL (ref 0.1–1.0)
Monocytes Relative: 9 %
Neutro Abs: 2.8 10*3/uL (ref 1.7–7.7)
Neutrophils Relative %: 56 %
Platelets: 183 10*3/uL (ref 150–400)
RBC: 4.97 MIL/uL (ref 4.22–5.81)
RDW: 12.1 % (ref 11.5–15.5)
WBC: 5 10*3/uL (ref 4.0–10.5)

## 2016-11-18 LAB — CBG MONITORING, ED: Glucose-Capillary: 251 mg/dL — ABNORMAL HIGH (ref 65–99)

## 2016-11-18 MED ORDER — SODIUM CHLORIDE 0.9 % IV BOLUS (SEPSIS)
1000.0000 mL | Freq: Once | INTRAVENOUS | Status: AC
  Administered 2016-11-18: 1000 mL via INTRAVENOUS

## 2016-11-18 MED ORDER — CYCLOBENZAPRINE HCL 5 MG PO TABS: 5.0000 mg | ORAL_TABLET | Freq: Three times a day (TID) | ORAL | 0 refills | Status: DC | PRN

## 2016-11-18 MED ORDER — INSULIN ASPART 100 UNIT/ML ~~LOC~~ SOLN
5.0000 [IU] | Freq: Once | SUBCUTANEOUS | Status: AC
  Administered 2016-11-18: 5 [IU] via SUBCUTANEOUS
  Filled 2016-11-18: qty 1

## 2016-11-18 MED ORDER — MORPHINE SULFATE (PF) 4 MG/ML IV SOLN
4.0000 mg | Freq: Once | INTRAVENOUS | Status: AC
  Administered 2016-11-18: 4 mg via INTRAVENOUS
  Filled 2016-11-18: qty 1

## 2016-11-18 MED ORDER — CYCLOBENZAPRINE HCL 10 MG PO TABS
5.0000 mg | ORAL_TABLET | Freq: Once | ORAL | Status: AC
  Administered 2016-11-18: 5 mg via ORAL
  Filled 2016-11-18: qty 1

## 2016-11-18 NOTE — ED Provider Notes (Signed)
[facility] DEPT Provider Note   CSN: [id number] Arrival date & time: 11/18/16  1013     History   Chief Complaint Chief Complaint  Patient presents with  . Chest Pain    HPI Calvin [name] is a 56 y.o. male history of diabetes, hypertension, hyperlipidemia, previous MI, here presenting with chest pain. Patient states that Calvin [name] has intermittent substernal chest pain with radiation to the left arm about the last 2-3 days. Patient states that it is not associated with exertion or food or positioning. Denies any back pain or abdominal pain or weakness or numbness. Patient was seen here in November of last year for similar symptoms and had negative troponins in the ER was sent home. Patient saw Calvin [name] doctor today and had nonspecific EKG changes so sent here for evaluation. Patient states that Calvin [name] did have MI several years ago while in [place] but never had a cath or stents in Calvin [name] heart. Does not see a heart doctor currently.   The history is provided by the patient.    Past Medical History:  Diagnosis Date  . Arthritis   . Diabetes mellitus   . Heart attack ([place])    While living in [place]  [name] Hyperlipidemia   . Hypertension   . Sleep apnea    cpap    Patient Active Problem List   Diagnosis Date Noted  . Chronic systolic heart failure ([place]) 11/22/2015  . Essential hypertension 11/22/2015  . Chest pain 11/20/2015  . DM (diabetes mellitus) ([place]) 11/20/2015  . HLD (hyperlipidemia) 11/20/2015    Past Surgical History:  Procedure Laterality Date  . COLONOSCOPY  05/13/11   9 adenomas  . FOREARM SURGERY    . MUSCLE BIOPSY         Home Medications    Prior to Admission medications   Medication Sig Start Date End Date Taking? Authorizing Provider  amitriptyline (ELAVIL) 25 MG tablet Take 25 mg by mouth at bedtime.    [provider]  aspirin EC 81 MG tablet Take 81 mg by mouth daily.    [provider]  atorvastatin (LIPITOR) 40 MG tablet Take 40 mg by  mouth daily.     [provider]  carvedilol (COREG) 3.125 MG tablet Take 1 tablet (3.125 mg total) by mouth 2 (two) times daily with a meal. 11/22/15   Gherghe, Vella Redhead, MD  cephALEXin (KEFLEX) 500 MG capsule Take 1 capsule (500 mg total) by mouth 4 (four) times daily. Patient not taking: Reported on 05/25/2016 03/08/16   Montine Circle, [name]  glucose blood test strip 1 each by Other route See admin instructions. Check blood sugar 3 times daily.    [provider]  insulin aspart (NOVOLOG) 100 UNIT/ML injection Inject 30-45 Units into the skin 3 (three) times daily before meals. Per sliding scale    [provider]  insulin detemir (LEVEMIR) 100 UNIT/ML injection Inject 8 Units into the skin 2 (two) times daily.     [provider]  lisinopril (PRINIVIL,ZESTRIL) 10 MG tablet Take 1 tablet (10 mg total) by mouth daily. 11/22/15   Caren Griffins, MD  naproxen (NAPROSYN) 500 MG tablet Take 1 tablet (500 mg total) by mouth 2 (two) times daily. Limit use to 5 days 05/25/16   Horton, Barbette Hair, MD  nitroGLYCERIN (NITROSTAT) 0.4 MG SL tablet Place 0.4 mg under the tongue every 5 (five) minutes as needed for chest pain.    [provider]  omeprazole (PRILOSEC) 20 MG capsule  Take 1 capsule (20 mg total) by mouth daily. Patient not taking: Reported on 05/25/2016 01/17/14   Jola Schmidt, MD  promethazine (PHENERGAN) 25 MG tablet Take 1 tablet (25 mg total) by mouth every 6 (six) hours as needed for nausea or vomiting. Patient not taking: Reported on 05/25/2016 01/17/14   Jola Schmidt, MD    Family History Family History  Problem Relation Age of Onset  . Heart disease Mother   . Heart attack Mother 48  . Hypertension Mother   . Hyperlipidemia Mother   . Heart disease Father   . Heart attack Father 38  . Hypertension Father   . Hyperlipidemia Father   . Heart attack Sister 34  . Colon cancer Neg Hx   . Stomach cancer Neg Hx     Social  History Social History  Substance Use Topics  . Smoking status: Never Smoker  . Smokeless tobacco: Never Used  . Alcohol use No     Allergies   Patient has no known allergies.   Review of Systems Review of Systems  Cardiovascular: Positive for chest pain.  All other systems reviewed and are negative.    Physical Exam Updated Vital Signs BP 108/67   Pulse 87   Temp 98 F (36.7 C) (Oral)   Resp 11   Ht 5' 10"  (1.778 m)   Wt 167 lb (75.8 kg)   SpO2 100%   BMI 23.96 kg/m   Physical Exam  Constitutional: Calvin [name] is oriented to person, place, and time. Calvin [name] appears well-developed and well-nourished.  HENT:  Head: Normocephalic.  Mouth/Throat: Oropharynx is clear and moist.  Eyes: EOM are normal. Pupils are equal, round, and reactive to light.  Neck: Normal range of motion. Neck supple.  Cardiovascular: Normal rate, regular rhythm and normal heart sounds.   Pulmonary/Chest: Effort normal and breath sounds normal. No respiratory distress. Calvin [name] has no wheezes.  + reproducible chest tenderness   Abdominal: Soft. Bowel sounds are normal.  Musculoskeletal: Normal range of motion. Calvin [name] exhibits no edema.  Neurological: Calvin [name] is alert and oriented to person, place, and time. No cranial nerve deficit. Coordination normal.  Skin: Skin is warm.  Psychiatric: Calvin [name] has a normal mood and affect. Calvin [name] behavior is normal.  Nursing note and vitals reviewed.    ED Treatments / Results  Labs (all labs ordered are listed, but only abnormal results are displayed) Labs Reviewed  COMPREHENSIVE METABOLIC PANEL - Abnormal; Notable for the following:       Result Value   Sodium 134 (*)    Glucose, Bld 340 (*)    Total Protein 6.4 (*)    All other components within normal limits  CBG MONITORING, ED - Abnormal; Notable for the following:    Glucose-Capillary 251 (*)    All other components within normal limits  CBC WITH DIFFERENTIAL/PLATELET  I-STAT TROPOININ, ED  Randolm Idol, ED    EKG  EKG  Interpretation  Date/Time:  Friday Nov 18 2016 10:13:57 EDT Ventricular Rate:  86 PR Interval:    QRS Duration: 91 QT Interval:  382 QTC Calculation: 457 R Axis:   -22 Text Interpretation:  Sinus rhythm Borderline left axis deviation Nonspecific T abnrm, anterolateral leads Minimal ST elevation, anterior leads No significant change since last tracing Confirmed by Krimson Massmann  MD, Latriece Anstine ([id number]) on 11/18/2016 10:17:13 AM       Radiology Dg Chest 2 View  Result Date: 11/18/2016 CLINICAL DATA:  Mid to left-sided chest pain and cough over the last week.  EXAM: CHEST  2 VIEW COMPARISON:  05/24/2016 FINDINGS: Artifact overlies chest. Heart size is normal. Mediastinal shadows are normal. The lungs are clear. No bronchial thickening. No infiltrate, mass, effusion or collapse. Pulmonary vascularity is normal. No bony abnormality. IMPRESSION: Overlying artifact.  Otherwise normal. Electronically Signed   By: Nelson Chimes M.D.   On: 11/18/2016 11:09    Procedures Procedures (including critical care time)  Medications Ordered in ED Medications  morphine 4 MG/ML injection 4 mg (4 mg Intravenous Given 11/18/16 1038)  cyclobenzaprine (FLEXERIL) tablet 5 mg (5 mg Oral Given 11/18/16 1130)  sodium chloride 0.9 % bolus 1,000 mL (1,000 mLs Intravenous New Bag/Given 11/18/16 1213)  insulin aspart (novoLOG) injection 5 Units (5 Units Subcutaneous Given 11/18/16 1213)     Initial Impression / Assessment and Plan / ED Course  I have reviewed the triage vital signs and the nursing notes.  Pertinent labs & imaging results that were available during my care of the patient were reviewed by me and considered in my medical decision making (see chart for details).     Calvin [name] is a 56 y.o. male here with chest pain. Going on for several days. ? Hx of MI but never had a cath or stents. PCP thought there were some EKG changes but it is unchanged since previous. Pain is reproducible so I think likely MSK. Will get  labs, trop x 2, CXR   2:34 PM Trop neg x 2. Glucose 340, AG 8. Given NS 1 L bolus and insulin and glucose down to 251. Calvin [name] has levemir prescribed by PCP. Calvin [name] blood sugar is always 300-400s when Calvin [name] is in the ED. Calvin [name] chest pain likely MSK. Recommend close follow up with PCP for management of diabetes.    Final Clinical Impressions(s) / ED Diagnoses   Final diagnoses:  None    New Prescriptions New Prescriptions   No medications on file     Drenda Freeze, MD 11/18/16 1436

## 2016-11-18 NOTE — ED Notes (Signed)
Patient transported to X-ray 

## 2016-11-18 NOTE — ED Triage Notes (Signed)
Pt brought in by EMS from MD office due to having chest pain. Per pt, pain radiates to left arm. Pt a&ox4.

## 2016-11-18 NOTE — Discharge Instructions (Signed)
Take motrin, tylenol for pain.   Take flexeril for muscle spasms.   Your blood sugar is over 300. Please take your meds for diabetes as prescribed.   Please check your sugar frequently.  See your doctor next week for follow up   Return to ER if you have worse chest pain, trouble breathing, abdominal pain, fever, vomiting, sugar > 500

## 2017-01-13 ENCOUNTER — Emergency Department (HOSPITAL_COMMUNITY): Payer: Self-pay

## 2017-01-13 ENCOUNTER — Other Ambulatory Visit: Payer: Self-pay

## 2017-01-13 ENCOUNTER — Encounter (HOSPITAL_COMMUNITY): Payer: Self-pay

## 2017-01-13 ENCOUNTER — Emergency Department (HOSPITAL_COMMUNITY)
Admission: EM | Admit: 2017-01-13 | Discharge: 2017-01-13 | Disposition: A | Discharge status: Home / Self Care | Payer: Self-pay | Attending: Emergency Medicine | Admitting: Emergency Medicine

## 2017-01-13 DIAGNOSIS — R0602 Shortness of breath: Secondary | ICD-10-CM | POA: Insufficient documentation

## 2017-01-13 DIAGNOSIS — Z7982 Long term (current) use of aspirin: Secondary | ICD-10-CM | POA: Insufficient documentation

## 2017-01-13 DIAGNOSIS — E119 Type 2 diabetes mellitus without complications: Secondary | ICD-10-CM | POA: Insufficient documentation

## 2017-01-13 DIAGNOSIS — M62838 Other muscle spasm: Secondary | ICD-10-CM | POA: Insufficient documentation

## 2017-01-13 DIAGNOSIS — Z794 Long term (current) use of insulin: Secondary | ICD-10-CM | POA: Insufficient documentation

## 2017-01-13 DIAGNOSIS — Z8673 Personal history of transient ischemic attack (TIA), and cerebral infarction without residual deficits: Secondary | ICD-10-CM | POA: Insufficient documentation

## 2017-01-13 DIAGNOSIS — R0789 Other chest pain: Secondary | ICD-10-CM

## 2017-01-13 DIAGNOSIS — I5022 Chronic systolic (congestive) heart failure: Secondary | ICD-10-CM | POA: Insufficient documentation

## 2017-01-13 DIAGNOSIS — I110 Hypertensive heart disease with heart failure: Secondary | ICD-10-CM | POA: Insufficient documentation

## 2017-01-13 DIAGNOSIS — Z79899 Other long term (current) drug therapy: Secondary | ICD-10-CM | POA: Insufficient documentation

## 2017-01-13 LAB — CBC
HCT: 44.8 % (ref 39.0–52.0)
Hemoglobin: 16 g/dL (ref 13.0–17.0)
MCH: 31.4 pg (ref 26.0–34.0)
MCHC: 35.7 g/dL (ref 30.0–36.0)
MCV: 87.8 fL (ref 78.0–100.0)
Platelets: 205 10*3/uL (ref 150–400)
RBC: 5.1 MIL/uL (ref 4.22–5.81)
RDW: 11.9 % (ref 11.5–15.5)
WBC: 5.2 10*3/uL (ref 4.0–10.5)

## 2017-01-13 LAB — I-STAT CHEM 8, ED
BUN: 7 mg/dL (ref 6–20)
Calcium, Ion: 1.17 mmol/L (ref 1.15–1.40)
Chloride: 102 mmol/L (ref 101–111)
Creatinine, Ser: 1 mg/dL (ref 0.61–1.24)
Glucose, Bld: 223 mg/dL — ABNORMAL HIGH (ref 65–99)
HCT: 44 % (ref 39.0–52.0)
Hemoglobin: 15 g/dL (ref 13.0–17.0)
Potassium: 3.6 mmol/L (ref 3.5–5.1)
Sodium: 140 mmol/L (ref 135–145)
TCO2: 24 mmol/L (ref 0–100)

## 2017-01-13 LAB — BASIC METABOLIC PANEL
Anion gap: 9 (ref 5–15)
BUN: 8 mg/dL (ref 6–20)
CO2: 21 mmol/L — ABNORMAL LOW (ref 22–32)
Calcium: 9 mg/dL (ref 8.9–10.3)
Chloride: 102 mmol/L (ref 101–111)
Creatinine, Ser: 1.19 mg/dL (ref 0.61–1.24)
GFR calc Af Amer: >60 mL/min (ref >60)
GFR calc non Af Amer: >60 mL/min (ref >60)
Glucose, Bld: 386 mg/dL — ABNORMAL HIGH (ref 65–99)
Potassium: 3.9 mmol/L (ref 3.5–5.1)
Sodium: 132 mmol/L — ABNORMAL LOW (ref 135–145)

## 2017-01-13 LAB — URINALYSIS, ROUTINE W REFLEX MICROSCOPIC
Bacteria, UA: NONE SEEN
Bilirubin Urine: NEGATIVE
Glucose, UA: >=500 mg/dL — AB
Hgb urine dipstick: NEGATIVE
Ketones, ur: NEGATIVE mg/dL
Leukocytes, UA: NEGATIVE
Nitrite: NEGATIVE
Protein, ur: NEGATIVE mg/dL
Specific Gravity, Urine: 1.03 (ref 1.005–1.030)
pH: 5 (ref 5.0–8.0)

## 2017-01-13 LAB — D-DIMER, QUANTITATIVE: D-Dimer, Quant: <0.27 ug/mL-FEU (ref 0.00–0.50)

## 2017-01-13 LAB — I-STAT TROPONIN, ED
Troponin i, poc: 0.01 ng/mL (ref 0.00–0.08)
Troponin i, poc: 0.01 ng/mL (ref 0.00–0.08)

## 2017-01-13 MED ORDER — SODIUM CHLORIDE 0.9 % IV BOLUS (SEPSIS)
1000.0000 mL | Freq: Once | INTRAVENOUS | Status: AC
  Administered 2017-01-13: 1000 mL via INTRAVENOUS

## 2017-01-13 MED ORDER — NAPROXEN 500 MG PO TABS: 500.0000 mg | ORAL_TABLET | Freq: Two times a day (BID) | ORAL | 0 refills | Status: DC

## 2017-01-13 MED ORDER — METHOCARBAMOL 500 MG PO TABS: 500.0000 mg | ORAL_TABLET | Freq: Every evening | ORAL | 0 refills | Status: DC | PRN

## 2017-01-13 NOTE — ED Triage Notes (Signed)
Pt reports he woke up this morning around 9am with right sided chest pain, dizziness and lightheadedness and SOB. Hx of MI 3 years ago.

## 2017-01-13 NOTE — Discharge Instructions (Signed)
As discussed, the medicine prescribed can help with muscle spasm but cannot betaken if driving, with alcohol or operating machinery.  Naproxen for pain.  Stay well-hydrated and follow up with your primary care provider for better management of your blood sugar.

## 2017-01-13 NOTE — ED Provider Notes (Signed)
[name] DEPT Provider Note   CSN: [id number] Arrival date & time: 01/13/17  1426     History   Chief Complaint Chief Complaint  Patient presents with  . Chest Pain    HPI Calvin [name] is a 56 y.o. male history of diabetes insulin-dependent, hypertension, sleep apnea, depression, presenting with 2 days of constant chest pain which he describes as right upper chest radiating across his chest to the left and left arm cramping pain. He also reports shortness of breath and fatigue with any exertion. He denies any aggravating or alleviating factors. He states that he has had an MI 3 years ago while in [place] but denies ever seeing a cardiologist or getting a stress test echo or cath. He states that he doesn't really remember what they did that time. He also states that he has had this kind of pain in the past.  He has not tried anything for the pain. He reports chills, denies fever, nausea, vomiting, history of DVT/PE, malignancy, recent surgery, prolonged immobilization, cough, hemoptysis, or unilateral leg swelling or calf pain. Although he reports getting cramping spasm in his calves bilaterally as well as his arm while in the emergency department.  HPI  Past Medical History:  Diagnosis Date  . Arthritis   . Diabetes mellitus   . Heart attack ([place])    While living in [place]  [name] Hyperlipidemia   . Hypertension   . Sleep apnea    cpap    Patient Active Problem List   Diagnosis Date Noted  . Chronic systolic heart failure ([place]) 11/22/2015  . Essential hypertension 11/22/2015  . Chest pain 11/20/2015  . DM (diabetes mellitus) ([place]) 11/20/2015  . HLD (hyperlipidemia) 11/20/2015    Past Surgical History:  Procedure Laterality Date  . COLONOSCOPY  05/13/11   9 adenomas  . FOREARM SURGERY    . MUSCLE BIOPSY         Home Medications    Prior to Admission medications   Medication Sig Start Date End Date Taking? Authorizing Provider  amitriptyline (ELAVIL) 25 MG  tablet Take 25 mg by mouth at bedtime.   Yes [provider]  aspirin EC 81 MG tablet Take 81 mg by mouth daily.   Yes [provider]  atorvastatin (LIPITOR) 40 MG tablet Take 40 mg by mouth daily.    Yes [provider]  carvedilol (COREG) 3.125 MG tablet Take 1 tablet (3.125 mg total) by mouth 2 (two) times daily with a meal. 11/22/15  Yes Gherghe, Vella Redhead, MD  cyclobenzaprine (FLEXERIL) 5 MG tablet Take 1 tablet (5 mg total) by mouth 3 (three) times daily as needed for muscle spasms. 11/18/16  Yes Drenda Freeze, MD  insulin aspart (NOVOLOG) 100 UNIT/ML injection Inject 30-45 Units into the skin 3 (three) times daily before meals. Per sliding scale   Yes [provider]  insulin detemir (LEVEMIR) 100 UNIT/ML injection Inject 8 Units into the skin 2 (two) times daily.    Yes [provider]  lisinopril (PRINIVIL,ZESTRIL) 10 MG tablet Take 1 tablet (10 mg total) by mouth daily. 11/22/15  Yes Gherghe, Vella Redhead, MD  cephALEXin (KEFLEX) 500 MG capsule Take 1 capsule (500 mg total) by mouth 4 (four) times daily. Patient not taking: Reported on 05/25/2016 03/08/16   Montine Circle, [name]  glucose blood test strip 1 each by Other route See admin instructions. Check blood sugar 3 times daily.    [provider]  methocarbamol (ROBAXIN) 500 MG tablet  Take 1 tablet (500 mg total) by mouth at bedtime as needed for muscle spasms. 01/13/17   Avie Echevaria B, [name]  naproxen (NAPROSYN) 500 MG tablet Take 1 tablet (500 mg total) by mouth 2 (two) times daily with a meal. 01/13/17   Avie Echevaria B, [name]  nitroGLYCERIN (NITROSTAT) 0.4 MG SL tablet Place 0.4 mg under the tongue every 5 (five) minutes as needed for chest pain.    [provider]  omeprazole (PRILOSEC) 20 MG capsule Take 1 capsule (20 mg total) by mouth daily. Patient not taking: Reported on 05/25/2016 01/17/14   Jola Schmidt, MD  promethazine (PHENERGAN) 25 MG tablet Take 1 tablet  (25 mg total) by mouth every 6 (six) hours as needed for nausea or vomiting. Patient not taking: Reported on 05/25/2016 01/17/14   Jola Schmidt, MD    Family History Family History  Problem Relation Age of Onset  . Heart disease Mother   . Heart attack Mother 29  . Hypertension Mother   . Hyperlipidemia Mother   . Heart disease Father   . Heart attack Father 19  . Hypertension Father   . Hyperlipidemia Father   . Heart attack Sister 38  . Colon cancer Neg Hx   . Stomach cancer Neg Hx     Social History Social History  Substance Use Topics  . Smoking status: Never Smoker  . Smokeless tobacco: Never Used  . Alcohol use No     Allergies   Patient has no known allergies.   Review of Systems Review of Systems  Constitutional: Positive for chills and fatigue. Negative for fever.  HENT: Negative for ear pain and sore throat.   Eyes: Negative for pain and visual disturbance.  Respiratory: Positive for chest tightness and shortness of breath. Negative for cough, choking, wheezing and stridor.   Cardiovascular: Positive for chest pain. Negative for palpitations and leg swelling.  Gastrointestinal: Negative for abdominal distention, abdominal pain, diarrhea, nausea and vomiting.  Genitourinary: Negative for dysuria and hematuria.  Musculoskeletal: Positive for myalgias. Negative for arthralgias, back pain, gait problem, joint swelling, neck pain and neck stiffness.  Skin: Negative for color change, pallor and rash.  Neurological: Negative for seizures, syncope and weakness.     Physical Exam Updated Vital Signs BP (!) 157/96   Pulse 83   Temp 98.1 F (36.7 C) (Oral)   Resp (!) 22   SpO2 100%   Physical Exam  Constitutional: He appears well-developed and well-nourished. No distress.  Afebrile, nontoxic-appearing, sitting comfortably in bed in no acute distress.  HENT:  Head: Normocephalic and atraumatic.  Eyes: Conjunctivae and EOM are normal.  Neck: Neck supple.    Cardiovascular: Normal rate, regular rhythm, normal heart sounds and intact distal pulses.   No murmur heard. Pulmonary/Chest: Effort normal and breath sounds normal. No respiratory distress. He has no wheezes. He has no rales. He exhibits tenderness.  Abdominal: Soft. He exhibits no distension. There is no tenderness.  Musculoskeletal: Normal range of motion. He exhibits tenderness. He exhibits no edema or deformity.  Patient was experiencing a cramp and spasm in his right calf as I was examining him which was of sudden onset while in the emergency department. Resolved. No edema bilaterally  Neurological: He is alert.  Skin: Skin is warm and dry. Capillary refill takes less than 2 seconds. No rash noted. He is not diaphoretic. No erythema. No pallor.  Psychiatric: He has a normal mood and affect.  Nursing note and vitals reviewed.  ED Treatments / Results  Labs (all labs ordered are listed, but only abnormal results are displayed) Labs Reviewed  BASIC METABOLIC PANEL - Abnormal; Notable for the following:       Result Value   Sodium 132 (*)    CO2 21 (*)    Glucose, Bld 386 (*)    All other components within normal limits  URINALYSIS, ROUTINE W REFLEX MICROSCOPIC - Abnormal; Notable for the following:    Glucose, UA >=500 (*)    Squamous Epithelial / LPF 0-5 (*)    All other components within normal limits  I-STAT CHEM 8, ED - Abnormal; Notable for the following:    Glucose, Bld 223 (*)    All other components within normal limits  CBC  D-DIMER, QUANTITATIVE (NOT AT [facility])  I-STAT TROPOININ, ED  I-STAT TROPOININ, ED    EKG  EKG Interpretation  Date/Time:  Friday January 13 2017 16:47:44 EDT Ventricular Rate:  82 PR Interval:  152 QRS Duration: 95 QT Interval:  373 QTC Calculation: 436 R Axis:   8 Text Interpretation:  Sinus rhythm Nonspecific T abnormalities, diffuse leads Baseline wander in lead(s) V6 Confirmed by Veryl Speak [phone]) on 01/13/2017 7:39:54 PM        Radiology Dg Chest 2 View  Result Date: 01/13/2017 CLINICAL DATA:  Chest pain EXAM: CHEST  2 VIEW COMPARISON:  Nov 18, 2016 FINDINGS: Lungs are clear. Heart size and pulmonary vascularity are normal. No adenopathy. No pneumothorax. No bone lesions. IMPRESSION: No edema or consolidation. Electronically Signed   By: Lowella Grip III M.D.   On: 01/13/2017 15:09    Procedures Procedures (including critical care time)  Medications Ordered in ED Medications  sodium chloride 0.9 % bolus 1,000 mL (0 mLs Intravenous Stopped 01/13/17 1820)     Initial Impression / Assessment and Plan / ED Course  I have reviewed the triage vital signs and the nursing notes.  Pertinent labs & imaging results that were available during my care of the patient were reviewed by me and considered in my medical decision making (see chart for details).     Patient presents with 2 days of chest pain and cramping spasm the left upper extremity and lower extremities. He also endorses shortness of breath and fatigue.  Workup today is negative, labs unremarkable other than hyperglycemia without anion gap or ketones in the urine. Patient was given IV fluids and glucose improved.  He is well-appearing, nontoxic and afebrile. Delta troponin negative. Low suspicion for ACS as etiology of patient's symptoms. Negative dimer, low suspicion for PE in this patient  Patient was experiencing muscle spasm while I was in the room with him. I believe that there is a musculoskeletal component to his symptoms and will discharge home with symptomatic relief including naproxen and Robaxin with close PCP follow-up.  Urge patient to have close follow-up with his PCP to discuss blood glucose management. Advised to stay well-hydrated. Patient was well-appearing and stable prior to discharge.  Discussed strict return precautions and advised to return to the emergency department if experiencing any new or worsening symptoms.  Instructions were understood and patient agreed with discharge plan.  Final Clinical Impressions(s) / ED Diagnoses   Final diagnoses:  Chest wall pain  Muscle spasm    New Prescriptions New Prescriptions   METHOCARBAMOL (ROBAXIN) 500 MG TABLET    Take 1 tablet (500 mg total) by mouth at bedtime as needed for muscle spasms.   NAPROXEN (NAPROSYN) 500 MG TABLET  Take 1 tablet (500 mg total) by mouth 2 (two) times daily with a meal.     Emeline General, [name] 01/13/17 Leonides Schanz    Veryl Speak, MD 01/13/17 2358

## 2017-03-18 ENCOUNTER — Emergency Department (HOSPITAL_COMMUNITY)
Admission: EM | Admit: 2017-03-18 | Discharge: 2017-03-18 | Disposition: A | Discharge status: Home / Self Care | Payer: Self-pay | Attending: Emergency Medicine | Admitting: Emergency Medicine

## 2017-03-18 ENCOUNTER — Encounter (HOSPITAL_COMMUNITY): Payer: Self-pay | Admitting: Emergency Medicine

## 2017-03-18 ENCOUNTER — Emergency Department (HOSPITAL_COMMUNITY): Payer: Self-pay

## 2017-03-18 DIAGNOSIS — Z7982 Long term (current) use of aspirin: Secondary | ICD-10-CM | POA: Insufficient documentation

## 2017-03-18 DIAGNOSIS — R0789 Other chest pain: Secondary | ICD-10-CM | POA: Insufficient documentation

## 2017-03-18 DIAGNOSIS — E1165 Type 2 diabetes mellitus with hyperglycemia: Secondary | ICD-10-CM | POA: Insufficient documentation

## 2017-03-18 DIAGNOSIS — Z794 Long term (current) use of insulin: Secondary | ICD-10-CM | POA: Insufficient documentation

## 2017-03-18 DIAGNOSIS — I5022 Chronic systolic (congestive) heart failure: Secondary | ICD-10-CM | POA: Insufficient documentation

## 2017-03-18 DIAGNOSIS — I252 Old myocardial infarction: Secondary | ICD-10-CM | POA: Insufficient documentation

## 2017-03-18 DIAGNOSIS — I110 Hypertensive heart disease with heart failure: Secondary | ICD-10-CM | POA: Insufficient documentation

## 2017-03-18 DIAGNOSIS — Z79899 Other long term (current) drug therapy: Secondary | ICD-10-CM | POA: Insufficient documentation

## 2017-03-18 DIAGNOSIS — R739 Hyperglycemia, unspecified: Secondary | ICD-10-CM

## 2017-03-18 LAB — BASIC METABOLIC PANEL
Anion gap: 9 (ref 5–15)
BUN: 17 mg/dL (ref 6–20)
CO2: 22 mmol/L (ref 22–32)
Calcium: 8.9 mg/dL (ref 8.9–10.3)
Chloride: 102 mmol/L (ref 101–111)
Creatinine, Ser: 1.26 mg/dL — ABNORMAL HIGH (ref 0.61–1.24)
GFR calc Af Amer: >60 mL/min (ref >60)
GFR calc non Af Amer: >60 mL/min (ref >60)
Glucose, Bld: 349 mg/dL — ABNORMAL HIGH (ref 65–99)
Potassium: 3.8 mmol/L (ref 3.5–5.1)
Sodium: 133 mmol/L — ABNORMAL LOW (ref 135–145)

## 2017-03-18 LAB — CBC
HCT: 45.6 % (ref 39.0–52.0)
Hemoglobin: 15.7 g/dL (ref 13.0–17.0)
MCH: 30.4 pg (ref 26.0–34.0)
MCHC: 34.4 g/dL (ref 30.0–36.0)
MCV: 88.2 fL (ref 78.0–100.0)
Platelets: 192 10*3/uL (ref 150–400)
RBC: 5.17 MIL/uL (ref 4.22–5.81)
RDW: 12.1 % (ref 11.5–15.5)
WBC: 4.6 10*3/uL (ref 4.0–10.5)

## 2017-03-18 LAB — CBG MONITORING, ED: Glucose-Capillary: 350 mg/dL — ABNORMAL HIGH (ref 65–99)

## 2017-03-18 LAB — URINALYSIS, ROUTINE W REFLEX MICROSCOPIC
Bilirubin Urine: NEGATIVE
Glucose, UA: >=500 mg/dL — AB
Hgb urine dipstick: NEGATIVE
Ketones, ur: NEGATIVE mg/dL
Leukocytes, UA: NEGATIVE
Nitrite: NEGATIVE
Protein, ur: NEGATIVE mg/dL
Specific Gravity, Urine: 1.021 (ref 1.005–1.030)
pH: 5 (ref 5.0–8.0)

## 2017-03-18 LAB — D-DIMER, QUANTITATIVE: D-Dimer, Quant: <0.27 ug/mL-FEU (ref 0.00–0.50)

## 2017-03-18 LAB — I-STAT TROPONIN, ED
Troponin i, poc: 0.04 ng/mL (ref 0.00–0.08)
Troponin i, poc: 0.01 ng/mL (ref 0.00–0.08)

## 2017-03-18 MED ORDER — SODIUM CHLORIDE 0.9 % IV BOLUS (SEPSIS)
500.0000 mL | Freq: Once | INTRAVENOUS | Status: AC
  Administered 2017-03-18: 500 mL via INTRAVENOUS

## 2017-03-18 MED ORDER — SODIUM CHLORIDE 0.9 % IV BOLUS (SEPSIS): 1000.0000 mL | Freq: Once | INTRAVENOUS | Status: DC

## 2017-03-18 MED ORDER — CARVEDILOL 3.125 MG PO TABS: 3.1250 mg | ORAL_TABLET | Freq: Two times a day (BID) | ORAL | 1 refills | Status: DC

## 2017-03-18 NOTE — ED Triage Notes (Addendum)
Pt states 3 days of left sided chest pain radiating into left neck/shoulder. Hx of MI but denies any cardiac caths/stents. Pain 10/10. Denies shortness of breath/nausea. Pt states he is a diabetic and his CBGS run in the 400-500s on a daily basis for 3 years. He does use insulin.

## 2017-03-18 NOTE — Discharge Instructions (Signed)
Use Tylenol as needed for chest pain. You may take this 3 times a day. Start taking Coreg twice a day. Continue to take baby aspirin and insulin as prescribed. Follow-up with your primary care doctor to discuss further management of your blood sugars. It is very important that you follow the diet that you and your primary care doctor have set. Try to decrease your soda intake. Follow-up with cardiology for further evaluation of your heart. This is very important. Return to the emergency room if you develop persistent chest pain, shortness of breath, difficulty breathing, or any new or worsening symptoms.

## 2017-03-18 NOTE — ED Notes (Signed)
Declined W/C at D/C and was escorted to lobby by RN. 

## 2017-03-18 NOTE — ED Provider Notes (Signed)
[facility] DEPT Provider Note   CSN: [id number] Arrival date & time: 03/18/17  1212     History   Chief Complaint Chief Complaint  Patient presents with  . Chest Pain    HPI Calvin [name] is a 56 y.o. male presenting with 3 days of L upper chest pain.  Pt states that his pain has been a constant, sharp pain in his L upper chest. It feels similar to when he had a MI 3 years ago. This happened when he was in [place], and he states he has never had cardiology f/u since. He does not think he has had a cath or stress testing done. He was seen in the ED last year and admitted with cardiology f/u, but never follow up after d/c. He has been seen here several times for CP in the past few months, without obvious cardiac cause of pain. He denies associated SOB, diaphoresis, or N/V. He pain does not radiate. Nothing makes it better, and touching his L chest wall makes it worse. He is not sure which medications he is taking, but is not taking his coreg. He is taking insulin for DM, but states he BGLs are usually 300-400. He has discussed this with his PCP, and they are working on a diet and trying to manage his blood sugar better.  He denies fevers, chills, HA, cough, palpitations, abd pain, urinary sxs or abnormal BMs. He reports feeling more thirsty recently. He denies leg pain or swelling.    HPI  Past Medical History:  Diagnosis Date  . Arthritis   . Diabetes mellitus   . Heart attack ([place])    While living in [place]  [name] Hyperlipidemia   . Hypertension   . Sleep apnea    cpap    Patient Active Problem List   Diagnosis Date Noted  . Chronic systolic heart failure ([place]) 11/22/2015  . Essential hypertension 11/22/2015  . Chest pain 11/20/2015  . DM (diabetes mellitus) ([place]) 11/20/2015  . HLD (hyperlipidemia) 11/20/2015    Past Surgical History:  Procedure Laterality Date  . COLONOSCOPY  05/13/11   9 adenomas  . FOREARM SURGERY    . MUSCLE BIOPSY         Home Medications     Prior to Admission medications   Medication Sig Start Date End Date Taking? Authorizing Provider  amitriptyline (ELAVIL) 25 MG tablet Take 25 mg by mouth at bedtime.   Yes [provider]  aspirin EC 81 MG tablet Take 81 mg by mouth daily.   Yes [provider]  atorvastatin (LIPITOR) 40 MG tablet Take 40 mg by mouth daily.    Yes [provider]  cyclobenzaprine (FLEXERIL) 5 MG tablet Take 1 tablet (5 mg total) by mouth 3 (three) times daily as needed for muscle spasms. 11/18/16  Yes Drenda Freeze, MD  insulin aspart (NOVOLOG) 100 UNIT/ML injection Inject 30-45 Units into the skin 3 (three) times daily before meals. Per sliding scale   Yes [provider]  insulin detemir (LEVEMIR) 100 UNIT/ML injection Inject 8 Units into the skin 2 (two) times daily after a meal.    Yes [provider]  lisinopril (PRINIVIL,ZESTRIL) 10 MG tablet Take 1 tablet (10 mg total) by mouth daily. 11/22/15  Yes Caren Griffins, MD  naproxen (NAPROSYN) 500 MG tablet Take 1 tablet (500 mg total) by mouth 2 (two) times daily with a meal. 01/13/17  Yes Avie Echevaria B, [name]  PRESCRIPTION MEDICATION Take 1 tablet by  mouth daily as needed (acid reflux). Prescription medication for acid reflux   Yes [provider]  carvedilol (COREG) 3.125 MG tablet Take 1 tablet (3.125 mg total) by mouth 2 (two) times daily with a meal. 03/18/17   Adolphe Fortunato, [name]  glucose blood test strip 1 each by Other route See admin instructions. Check blood sugar 3 times daily.    [provider]  methocarbamol (ROBAXIN) 500 MG tablet Take 1 tablet (500 mg total) by mouth at bedtime as needed for muscle spasms. Patient not taking: Reported on 03/18/2017 01/13/17   Avie Echevaria B, [name]  nitroGLYCERIN (NITROSTAT) 0.4 MG SL tablet Place 0.4 mg under the tongue every 5 (five) minutes as needed for chest pain.    [provider]  omeprazole (PRILOSEC) 20 MG capsule  Take 1 capsule (20 mg total) by mouth daily. Patient not taking: Reported on 05/25/2016 01/17/14   Jola Schmidt, MD  promethazine (PHENERGAN) 25 MG tablet Take 1 tablet (25 mg total) by mouth every 6 (six) hours as needed for nausea or vomiting. Patient not taking: Reported on 05/25/2016 01/17/14   Jola Schmidt, MD    Family History Family History  Problem Relation Age of Onset  . Heart disease Mother   . Heart attack Mother 67  . Hypertension Mother   . Hyperlipidemia Mother   . Heart disease Father   . Heart attack Father 39  . Hypertension Father   . Hyperlipidemia Father   . Heart attack Sister 52  . Colon cancer Neg Hx   . Stomach cancer Neg Hx     Social History Social History  Substance Use Topics  . Smoking status: Never Smoker  . Smokeless tobacco: Never Used  . Alcohol use No     Allergies   Patient has no known allergies.   Review of Systems Review of Systems  Constitutional: Negative for chills and fever.  HENT: Negative for congestion and sore throat.   Eyes: Negative for visual disturbance.  Respiratory: Negative for cough, chest tightness and shortness of breath.   Cardiovascular: Positive for chest pain. Negative for palpitations and leg swelling.  Gastrointestinal: Negative for abdominal pain, constipation, diarrhea, nausea and vomiting.  Endocrine: Positive for polydipsia. Negative for polyuria.  Genitourinary: Negative for dysuria, frequency and hematuria.  Musculoskeletal: Negative for back pain and neck stiffness.  Skin: Negative for wound.  Allergic/Immunologic: Negative for immunocompromised state.  Neurological: Negative for dizziness, light-headedness and headaches.  Hematological: Does not bruise/bleed easily.     Physical Exam Updated Vital Signs BP (!) 151/92   Pulse 83   Temp 97.7 F (36.5 C) (Oral)   Resp 17   SpO2 100%   Physical Exam  Constitutional: He is oriented to person, place, and time. He appears well-developed and  well-nourished. No distress.  HENT:  Head: Normocephalic and atraumatic.  Eyes: EOM are normal.  Neck: Normal range of motion.  Cardiovascular: Normal rate, regular rhythm and intact distal pulses.   Pulmonary/Chest: Effort normal and breath sounds normal. No respiratory distress. He has no decreased breath sounds. He has no wheezes. He has no rhonchi. He has no rales. He exhibits tenderness.    TTP of entire L chest wall, worse at L upper chest (see picture)  Abdominal: Soft. Bowel sounds are normal. He exhibits no distension. There is no tenderness. There is no rigidity, no rebound and no guarding.  Musculoskeletal: Normal range of motion. He exhibits no edema or tenderness.  Strength intact x4. Sensation intact  x4. Radial and pedal pulses equal bilaterally. No pain or swelling of legs.   Lymphadenopathy:    He has no cervical adenopathy.  Neurological: He is alert and oriented to person, place, and time. No sensory deficit.  Skin: Skin is warm and dry.  Psychiatric: He has a normal mood and affect.  Nursing note and vitals reviewed.    ED Treatments / Results  Labs (all labs ordered are listed, but only abnormal results are displayed) Labs Reviewed  BASIC METABOLIC PANEL - Abnormal; Notable for the following:       Result Value   Sodium 133 (*)    Glucose, Bld 349 (*)    Creatinine, Ser 1.26 (*)    All other components within normal limits  URINALYSIS, ROUTINE W REFLEX MICROSCOPIC - Abnormal; Notable for the following:    Glucose, UA >=500 (*)    Bacteria, UA RARE (*)    Squamous Epithelial / LPF 0-5 (*)    All other components within normal limits  CBG MONITORING, ED - Abnormal; Notable for the following:    Glucose-Capillary 350 (*)    All other components within normal limits  CBC  D-DIMER, QUANTITATIVE (NOT AT [facility])  I-STAT TROPONIN, ED  I-STAT TROPONIN, ED    EKG  EKG Interpretation  Date/Time:  Saturday March 18 2017 12:20:46 EDT Ventricular Rate:   97 PR Interval:  160 QRS Duration: 88 QT Interval:  344 QTC Calculation: 436 R Axis:   9 Text Interpretation:  Normal sinus rhythm Septal infarct , age undetermined T wave abnormality, consider lateral ischemia Abnormal ECG since last tracing no significant change Confirmed by Daleen Bo [id number]) on 03/18/2017 4:19:40 PM       Radiology Dg Chest 2 View  Result Date: 03/18/2017 CLINICAL DATA:  56 year old male with a history of left-sided chest pain EXAM: CHEST  2 VIEW COMPARISON:  01/13/2017 FINDINGS: The heart size and mediastinal contours are within normal limits. Both lungs are clear. The visualized skeletal structures are unremarkable. Similar appearance of asymmetric elevation the right hemidiaphragm IMPRESSION: No radiographic evidence of acute cardiopulmonary disease Electronically Signed   By: Corrie Mckusick D.O.   On: 03/18/2017 12:49    Procedures Procedures (including critical care time)  Medications Ordered in ED Medications  sodium chloride 0.9 % bolus 500 mL (0 mLs Intravenous Stopped 03/18/17 1742)     Initial Impression / Assessment and Plan / ED Course  I have reviewed the triage vital signs and the nursing notes.  Pertinent labs & imaging results that were available during my care of the patient were reviewed by me and considered in my medical decision making (see chart for details).     Pt presenting with 3 day h/o CP without associated SOB, n/v, or diaphoresis. Physical exam reassuring, as pt with reproducible chest wall pain, and otherwise negative exam. Initial cardiac labs show negative troponin, reassuring CBC and hyperglycemia. No anion gap or change in CO2, doubt DKA. UA negative for UTI. EKG unchanged from last tracing. CXR negative for infiltrate, PNX, or obvious injury. Will start IVF for hyperglycemia and repeat troponin after 3 hrs. D dimer to r/o PE.  Repeat troponin negative. D dimer negative. Pt heart score of 3/low risk. At this time, I believe  pt is safe for discharge. Will restart him on coreg and discussed importance of DM control and cardio f/u. Discussed case with attending, and Dr. Eulis Foster agrees to plan. Strict return precautions given. Pt states he understands and agrees to  plan.   Final Clinical Impressions(s) / ED Diagnoses   Final diagnoses:  Atypical chest pain  Hyperglycemia    New Prescriptions Discharge Medication List as of 03/18/2017  5:25 PM       Franchot Heidelberg, [name] 03/19/17 1730    Daleen Bo, MD 03/22/17 1646

## 2017-04-14 ENCOUNTER — Encounter (HOSPITAL_COMMUNITY): Payer: Self-pay | Admitting: Emergency Medicine

## 2017-04-14 ENCOUNTER — Emergency Department (HOSPITAL_COMMUNITY)
Admission: EM | Admit: 2017-04-14 | Discharge: 2017-04-14 | Disposition: A | Discharge status: Home / Self Care | Payer: Self-pay | Attending: Emergency Medicine | Admitting: Emergency Medicine

## 2017-04-14 DIAGNOSIS — I5022 Chronic systolic (congestive) heart failure: Secondary | ICD-10-CM | POA: Insufficient documentation

## 2017-04-14 DIAGNOSIS — Z7982 Long term (current) use of aspirin: Secondary | ICD-10-CM | POA: Insufficient documentation

## 2017-04-14 DIAGNOSIS — I252 Old myocardial infarction: Secondary | ICD-10-CM | POA: Insufficient documentation

## 2017-04-14 DIAGNOSIS — Z794 Long term (current) use of insulin: Secondary | ICD-10-CM | POA: Insufficient documentation

## 2017-04-14 DIAGNOSIS — Z79899 Other long term (current) drug therapy: Secondary | ICD-10-CM | POA: Insufficient documentation

## 2017-04-14 DIAGNOSIS — E119 Type 2 diabetes mellitus without complications: Secondary | ICD-10-CM | POA: Insufficient documentation

## 2017-04-14 DIAGNOSIS — L237 Allergic contact dermatitis due to plants, except food: Secondary | ICD-10-CM | POA: Insufficient documentation

## 2017-04-14 DIAGNOSIS — I110 Hypertensive heart disease with heart failure: Secondary | ICD-10-CM | POA: Insufficient documentation

## 2017-04-14 NOTE — ED Notes (Signed)
Pt after being seen by the PA walked out of the room and left. Pt unable to receive discharge paperwork. Or unable to sign for discharge.

## 2017-04-14 NOTE — ED Triage Notes (Signed)
Pt reports itchy rash to forearms for the last week. States has tried OTC benedryl capsules and cortisone cream with little improvement.

## 2017-04-14 NOTE — ED Provider Notes (Signed)
[name] DEPT Provider Note   CSN: [id number] Arrival date & time: 04/14/17  1352     History   Chief Complaint Chief Complaint  Patient presents with  . Rash    HPI Calvin [name] is a 56 y.o. male past medical history of uncontrolled diabetes, hyperlipidemia, hypertension, presenting to the ED for itchy persistent rash 2 days. She states he was working in the yard and may have come in contact with poison ivy. He states rashes to his bilateral forearms are not painful, no purulent drainage. He has been taking Benadryl and applying hydrocortisone cream with mild relief of symptoms. He denies new medications or recent antibiotics. Denies lip or tongue swelling, difficulty breathing or swallowing, fever, or any other symptoms.  The history is provided by the patient.    Past Medical History:  Diagnosis Date  . Arthritis   . Diabetes mellitus   . Heart attack ([place])    [place]  [name] Hyperlipidemia   . Hypertension   . Sleep apnea    cpap    Patient Active Problem List   Diagnosis Date Noted  . Chronic systolic heart failure ([place]) 11/22/2015  . Essential hypertension 11/22/2015  . Chest pain 11/20/2015  . DM (diabetes mellitus) ([place]) 11/20/2015  . HLD (hyperlipidemia) 11/20/2015    Past Surgical History:  Procedure Laterality Date  . COLONOSCOPY  05/13/11   9 adenomas  . FOREARM SURGERY    . MUSCLE BIOPSY         Home Medications    Prior to Admission medications   Medication Sig Start Date End Date Taking? Authorizing Provider  amitriptyline (ELAVIL) 25 MG tablet Take 25 mg by mouth at bedtime.    [provider]  aspirin EC 81 MG tablet Take 81 mg by mouth daily.    [provider]  atorvastatin (LIPITOR) 40 MG tablet Take 40 mg by mouth daily.     [provider]  carvedilol (COREG) 3.125 MG tablet Take 1 tablet (3.125 mg total) by mouth 2 (two) times daily with a meal. 03/18/17   [name], [name], [name]    cyclobenzaprine (FLEXERIL) 5 MG tablet Take 1 tablet (5 mg total) by mouth 3 (three) times daily as needed for muscle spasms. 11/18/16   Drenda Freeze, MD  glucose blood test strip 1 each by Other route See admin instructions. Check blood sugar 3 times daily.    [provider]  insulin aspart (NOVOLOG) 100 UNIT/ML injection Inject 30-45 Units into the skin 3 (three) times daily before meals. Per sliding scale    [provider]  insulin detemir (LEVEMIR) 100 UNIT/ML injection Inject 8 Units into the skin 2 (two) times daily after a meal.     [provider]  lisinopril (PRINIVIL,ZESTRIL) 10 MG tablet Take 1 tablet (10 mg total) by mouth daily. 11/22/15   Caren Griffins, MD  methocarbamol (ROBAXIN) 500 MG tablet Take 1 tablet (500 mg total) by mouth at bedtime as needed for muscle spasms. Patient not taking: Reported on 03/18/2017 01/13/17   Avie Echevaria B, [name]  naproxen (NAPROSYN) 500 MG tablet Take 1 tablet (500 mg total) by mouth 2 (two) times daily with a meal. 01/13/17   Avie Echevaria B, [name]  nitroGLYCERIN (NITROSTAT) 0.4 MG SL tablet Place 0.4 mg under the tongue every 5 (five) minutes as needed for chest pain.    [provider]  omeprazole (PRILOSEC) 20 MG capsule Take 1 capsule (20 mg total) by  mouth daily. Patient not taking: Reported on 05/25/2016 01/17/14   Jola Schmidt, MD  PRESCRIPTION MEDICATION Take 1 tablet by mouth daily as needed (acid reflux). Prescription medication for acid reflux    [provider]  promethazine (PHENERGAN) 25 MG tablet Take 1 tablet (25 mg total) by mouth every 6 (six) hours as needed for nausea or vomiting. Patient not taking: Reported on 05/25/2016 01/17/14   Jola Schmidt, MD    Family History Family History  Problem Relation Age of Onset  . Heart disease Mother   . Heart attack Mother 50  . Hypertension Mother   . Hyperlipidemia Mother   . Heart disease Father   . Heart attack Father 71   . Hypertension Father   . Hyperlipidemia Father   . Heart attack Sister 65  . Colon cancer Neg Hx   . Stomach cancer Neg Hx     Social History Social History  Substance Use Topics  . Smoking status: Never Smoker  . Smokeless tobacco: Never Used  . Alcohol use No     Allergies   Patient has no known allergies.   Review of Systems Review of Systems  Constitutional: Negative for fever.  HENT: Negative for facial swelling, trouble swallowing and voice change.   Respiratory: Negative for shortness of breath and stridor.   Skin: Positive for rash.     Physical Exam Updated Vital Signs BP 127/90 (BP Location: Right Arm)   Pulse (!) 107   Temp 98.3 F (36.8 C) (Oral)   Resp 18   Ht 5' 7"  (1.702 m)   SpO2 100%   Physical Exam  Constitutional: He appears well-developed and well-nourished. No distress.  well appearing, tolerating secretions.  HENT:  Head: Normocephalic and atraumatic.  Mouth/Throat: Oropharynx is clear and moist.  Eyes: Conjunctivae are normal.  Cardiovascular: Normal rate and intact distal pulses.   Pulmonary/Chest: Effort normal.  Skin:  Erythematous papular rash with some clear vesicles to bilateral forearms. Some are confluent and some are linearly distributed. No bulla, petechia, desquamation, or purulent drainage. Rash is nontender.  Psychiatric: He has a normal mood and affect. His behavior is normal.  Nursing note and vitals reviewed.    ED Treatments / Results  Labs (all labs ordered are listed, but only abnormal results are displayed) Labs Reviewed - No data to display  EKG  EKG Interpretation None       Radiology No results found.  Procedures Procedures (including critical care time)  Medications Ordered in ED Medications - No data to display   Initial Impression / Assessment and Plan / ED Course  I have reviewed the triage vital signs and the nursing notes.  Pertinent labs & imaging results that were available during  my care of the patient were reviewed by me and considered in my medical decision making (see chart for details).      Pt presentation consistant with contact dermatitis likely 2/t poison ivy. No recent antibiotics or medications.  No bulla, petechia, purulent drainage or desquamation. Pt afebrile and in NAD prior to dc. Airway intact without compromise. Given patient's uncontrolled diabetes, and reported daily blood glucose readings in the 400s to 500s, feel prednisone is not appropriate at this time. Plan for Rx for hydroxyzine. Patient to continue using hydrocortisone cream if it provides him with relief. Strict return precautions discussed.  Pt left prior to receiving discharge paperwork and Rx.  Discussed results, findings, treatment and follow up. Patient advised of return precautions. Patient verbalized understanding  and agreed with plan.  Final Clinical Impressions(s) / ED Diagnoses   Final diagnoses:  Allergic contact dermatitis due to plant    New Prescriptions Discharge Medication List as of 04/14/2017  4:10 PM       Russo, [place] N, [name] 04/14/17 1731    Nat Christen, MD 04/15/17 [phone]

## 2017-04-14 NOTE — Discharge Instructions (Signed)
Please read instructions below. Take 5m of benadryl every 6 hours, and 271mof pepcid every 12 hours for rash and itching.  You can continue applying hydrocortisone cream to your rash. Schedule an appointment with your primary care provider if rash persists. Return to the ER immediately for feeling your throat closing, swelling of your lips or tongue, difficulty breathing, or new or concerning symptoms.

## 2017-05-10 ENCOUNTER — Ambulatory Visit (INDEPENDENT_AMBULATORY_CARE_PROVIDER_SITE_OTHER): Payer: Medicare HMO | Admitting: Internal Medicine

## 2017-05-10 ENCOUNTER — Encounter: Payer: Self-pay | Admitting: Internal Medicine

## 2017-05-10 VITALS — BP 128/80 | HR 92 | Temp 97.7°F | Resp 16 | Ht 67.0 in | Wt 166.4 lb

## 2017-05-10 DIAGNOSIS — G4700 Insomnia, unspecified: Secondary | ICD-10-CM | POA: Diagnosis not present

## 2017-05-10 DIAGNOSIS — Z9989 Dependence on other enabling machines and devices: Secondary | ICD-10-CM | POA: Diagnosis not present

## 2017-05-10 DIAGNOSIS — E1142 Type 2 diabetes mellitus with diabetic polyneuropathy: Secondary | ICD-10-CM | POA: Diagnosis not present

## 2017-05-10 DIAGNOSIS — Z794 Long term (current) use of insulin: Secondary | ICD-10-CM

## 2017-05-10 DIAGNOSIS — B351 Tinea unguium: Secondary | ICD-10-CM | POA: Insufficient documentation

## 2017-05-10 DIAGNOSIS — G4733 Obstructive sleep apnea (adult) (pediatric): Secondary | ICD-10-CM | POA: Diagnosis not present

## 2017-05-10 DIAGNOSIS — E1140 Type 2 diabetes mellitus with diabetic neuropathy, unspecified: Secondary | ICD-10-CM

## 2017-05-10 DIAGNOSIS — I5022 Chronic systolic (congestive) heart failure: Secondary | ICD-10-CM | POA: Diagnosis not present

## 2017-05-10 DIAGNOSIS — K219 Gastro-esophageal reflux disease without esophagitis: Secondary | ICD-10-CM | POA: Diagnosis not present

## 2017-05-10 DIAGNOSIS — I10 Essential (primary) hypertension: Secondary | ICD-10-CM

## 2017-05-10 DIAGNOSIS — E785 Hyperlipidemia, unspecified: Secondary | ICD-10-CM

## 2017-05-10 DIAGNOSIS — E1169 Type 2 diabetes mellitus with other specified complication: Secondary | ICD-10-CM

## 2017-05-10 DIAGNOSIS — Z23 Encounter for immunization: Secondary | ICD-10-CM

## 2017-05-10 DIAGNOSIS — R079 Chest pain, unspecified: Secondary | ICD-10-CM | POA: Diagnosis not present

## 2017-05-10 HISTORY — DX: Type 2 diabetes mellitus with diabetic polyneuropathy: E11.42

## 2017-05-10 HISTORY — DX: Gastro-esophageal reflux disease without esophagitis: K21.9

## 2017-05-10 HISTORY — DX: Obstructive sleep apnea (adult) (pediatric): G47.33

## 2017-05-10 HISTORY — DX: Insomnia, unspecified: G47.00

## 2017-05-10 HISTORY — DX: Tinea unguium: B35.1

## 2017-05-10 MED ORDER — "INSULIN SYRINGE 29G X 1/2"" 0.3 ML MISC": 1.0000 | Freq: Three times a day (TID) | 3 refills | Status: AC

## 2017-05-10 MED ORDER — CARVEDILOL 3.125 MG PO TABS
3.1250 mg | ORAL_TABLET | Freq: Two times a day (BID) | ORAL | 3 refills | Status: DC
Start: 2017-05-10 — End: 2017-05-10

## 2017-05-10 MED ORDER — METFORMIN HCL 1000 MG PO TABS: 1000.0000 mg | ORAL_TABLET | Freq: Two times a day (BID) | ORAL | 3 refills | Status: DC

## 2017-05-10 MED ORDER — TRUE METRIX AIR GLUCOSE METER W/DEVICE KIT: 1.0000 | PACK | Freq: Three times a day (TID) | 0 refills | Status: DC

## 2017-05-10 MED ORDER — CARVEDILOL 3.125 MG PO TABS: 3.1250 mg | ORAL_TABLET | Freq: Two times a day (BID) | ORAL | 3 refills | Status: DC

## 2017-05-10 MED ORDER — NITROGLYCERIN 0.4 MG SL SUBL: 0.4000 mg | SUBLINGUAL_TABLET | SUBLINGUAL | 3 refills | Status: DC | PRN

## 2017-05-10 MED ORDER — LOVASTATIN 20 MG PO TABS: 20.0000 mg | ORAL_TABLET | Freq: Every day | ORAL | 3 refills | Status: DC

## 2017-05-10 MED ORDER — INSULIN DETEMIR 100 UNIT/ML ~~LOC~~ SOLN: SUBCUTANEOUS | 3 refills | Status: DC

## 2017-05-10 MED ORDER — LISINOPRIL 5 MG PO TABS: 5.0000 mg | ORAL_TABLET | Freq: Every day | ORAL | 3 refills | Status: DC

## 2017-05-10 NOTE — Patient Instructions (Addendum)
START MEDICATIONS AS DISCUSSED - CARVEDILOL, LISINOPRIL, ASPIRIN (81 mg), LOVASTATIN, LEVEMIR, NITROGLYCERIN, METFORMIN  Will call with lab results  Flu shot given today  Will call with referral appts  Check blood sugars 3 times daily and record. Call if blood sugar >400.  Follow up in 1 month for fasting CPE/AWV.
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
## 2017-05-11 ENCOUNTER — Telehealth: Payer: Self-pay

## 2017-05-11 ENCOUNTER — Encounter: Payer: Self-pay | Admitting: Internal Medicine

## 2017-05-11 ENCOUNTER — Other Ambulatory Visit: Payer: Medicare HMO

## 2017-05-11 DIAGNOSIS — Z794 Long term (current) use of insulin: Principal | ICD-10-CM

## 2017-05-11 DIAGNOSIS — R079 Chest pain, unspecified: Secondary | ICD-10-CM

## 2017-05-11 DIAGNOSIS — E785 Hyperlipidemia, unspecified: Secondary | ICD-10-CM | POA: Diagnosis not present

## 2017-05-11 DIAGNOSIS — E1140 Type 2 diabetes mellitus with diabetic neuropathy, unspecified: Secondary | ICD-10-CM

## 2017-05-11 DIAGNOSIS — Z9989 Dependence on other enabling machines and devices: Secondary | ICD-10-CM

## 2017-05-11 DIAGNOSIS — G4733 Obstructive sleep apnea (adult) (pediatric): Secondary | ICD-10-CM

## 2017-05-11 DIAGNOSIS — I5022 Chronic systolic (congestive) heart failure: Secondary | ICD-10-CM

## 2017-05-11 DIAGNOSIS — I10 Essential (primary) hypertension: Secondary | ICD-10-CM

## 2017-05-11 LAB — CBC WITH DIFFERENTIAL/PLATELET
Basophils Absolute: 17 cells/uL (ref 0–200)
Basophils Relative: 0.3 %
Eosinophils Absolute: 57 cells/uL (ref 15–500)
Eosinophils Relative: 1 %
HCT: 48.2 % (ref 38.5–50.0)
Hemoglobin: 16.5 g/dL (ref 13.2–17.1)
Lymphs Abs: 2132 cells/uL (ref 850–3900)
MCH: 30.5 pg (ref 27.0–33.0)
MCHC: 34.2 g/dL (ref 32.0–36.0)
MCV: 89.1 fL (ref 80.0–100.0)
MPV: 12.3 fL (ref 7.5–12.5)
Monocytes Relative: 5.8 %
Neutro Abs: 3164 cells/uL (ref 1500–7800)
Neutrophils Relative %: 55.5 %
Platelets: 232 10*3/uL (ref 140–400)
RBC: 5.41 10*6/uL (ref 4.20–5.80)
RDW: 12.1 % (ref 11.0–15.0)
Total Lymphocyte: 37.4 %
WBC mixed population: 331 cells/uL (ref 200–950)
WBC: 5.7 10*3/uL (ref 3.8–10.8)

## 2017-05-11 LAB — COMPLETE METABOLIC PANEL WITH GFR
AG Ratio: 1.6 (calc) (ref 1.0–2.5)
ALT: 13 U/L (ref 9–46)
AST: 11 U/L (ref 10–35)
Albumin: 4.2 g/dL (ref 3.6–5.1)
Alkaline phosphatase (APISO): 74 U/L (ref 40–115)
BUN: 11 mg/dL (ref 7–25)
CO2: 28 mmol/L (ref 20–32)
Calcium: 9.5 mg/dL (ref 8.6–10.3)
Chloride: 98 mmol/L (ref 98–110)
Creat: 1.27 mg/dL (ref 0.70–1.33)
GFR, Est African American: 72 mL/min/{1.73_m2} (ref >=60)
GFR, Est Non African American: 62 mL/min/{1.73_m2} (ref >=60)
Globulin: 2.6 g/dL (calc) (ref 1.9–3.7)
Glucose, Bld: 313 mg/dL — ABNORMAL HIGH (ref 65–99)
Potassium: 4.3 mmol/L (ref 3.5–5.3)
Sodium: 135 mmol/L (ref 135–146)
Total Bilirubin: 0.6 mg/dL (ref 0.2–1.2)
Total Protein: 6.8 g/dL (ref 6.1–8.1)

## 2017-05-11 LAB — LIPID PANEL
Cholesterol: 250 mg/dL — ABNORMAL HIGH (ref <200)
HDL: 60 mg/dL (ref >40)
LDL Cholesterol (Calc): 158 mg/dL (calc) — ABNORMAL HIGH
Non-HDL Cholesterol (Calc): 190 mg/dL (calc) — ABNORMAL HIGH (ref <130)
Total CHOL/HDL Ratio: 4.2 (calc) (ref <5.0)
Triglycerides: 172 mg/dL — ABNORMAL HIGH (ref <150)

## 2017-05-11 LAB — HEMOGLOBIN A1C
Hgb A1c MFr Bld: 11.9 % of total Hgb — ABNORMAL HIGH (ref <5.7)
Mean Plasma Glucose: 295 (calc)
eAG (mmol/L): 16.3 (calc)

## 2017-05-11 LAB — TSH: TSH: 1.3 mIU/L (ref 0.40–4.50)

## 2017-05-11 NOTE — Telephone Encounter (Signed)
Patient dropped off urine sample that he was unable to obtain at yesterday's appointment. Patient mentioned that he is unable to get his medications until next Wednesday when he gets paid.  I asked patient if he is interested in a referral to our Care Guide, she will be able to connect patient with resources for assistance with medications, patient was pleased to hear about this services    Referral pending if you agree with patient's need for medication assistance, please complete

## 2017-05-11 NOTE — Telephone Encounter (Signed)
Referral signed.

## 2017-05-12 LAB — MICROALBUMIN / CREATININE URINE RATIO
Creatinine, Urine: 62 mg/dL (ref 20–320)
Microalb Creat Ratio: 8 mcg/mg creat (ref <30)
Microalb, Ur: 0.5 mg/dL

## 2017-05-12 LAB — URINALYSIS, ROUTINE W REFLEX MICROSCOPIC
Bilirubin Urine: NEGATIVE
Hgb urine dipstick: NEGATIVE
Ketones, ur: NEGATIVE
Leukocytes, UA: NEGATIVE
Nitrite: NEGATIVE
Protein, ur: NEGATIVE
Specific Gravity, Urine: 1.029 (ref 1.001–1.03)
pH: 6 (ref 5.0–8.0)

## 2017-05-22 ENCOUNTER — Ambulatory Visit: Payer: Medicare HMO | Admitting: Podiatry

## 2017-05-22 ENCOUNTER — Encounter: Payer: Self-pay | Admitting: Podiatry

## 2017-05-22 DIAGNOSIS — B351 Tinea unguium: Secondary | ICD-10-CM | POA: Diagnosis not present

## 2017-05-22 DIAGNOSIS — M79676 Pain in unspecified toe(s): Secondary | ICD-10-CM | POA: Diagnosis not present

## 2017-05-22 DIAGNOSIS — E0843 Diabetes mellitus due to underlying condition with diabetic autonomic (poly)neuropathy: Secondary | ICD-10-CM

## 2017-05-22 MED ORDER — GABAPENTIN 100 MG PO CAPS: 100.0000 mg | ORAL_CAPSULE | Freq: Three times a day (TID) | ORAL | 3 refills | Status: DC

## 2017-05-24 MED ORDER — NONFORMULARY OR COMPOUNDED ITEM: 0 refills | Status: DC

## 2017-05-27 NOTE — Progress Notes (Signed)
   SUBJECTIVE Patient with a history of diabetes mellitus presents to office today complaining of elongated, thickened nails. Pain while ambulating in shoes. Patient is unable to trim their own nails.   Past Medical History:  Diagnosis Date  . Adenoidal hypertrophy   . Arthritis   . Diabetes mellitus   . GERD (gastroesophageal reflux disease)   . Heart attack ([place])    [place] living in [place]  [name] Hyperlipidemia   . Hypertension   . Obesity   . Sleep apnea    cpap  . Tobacco abuse     OBJECTIVE General Patient is awake, alert, and oriented x 3 and in no acute distress. Derm Skin is dry and supple bilateral. Negative open lesions or macerations. Remaining integument unremarkable. Nails are tender, long, thickened and dystrophic with subungual debris, consistent with onychomycosis, 1-5 bilateral. No signs of infection noted. Vasc  DP and PT pedal pulses palpable bilaterally. Temperature gradient within normal limits.  Neuro Epicritic and protective threshold sensation diminished bilaterally.  Musculoskeletal Exam No symptomatic pedal deformities noted bilateral. Muscular strength within normal limits.  ASSESSMENT 1. Diabetes Mellitus w/ peripheral neuropathy 2. Onychomycosis of nail due to dermatophyte bilateral 3. Pain in foot bilateral  PLAN OF CARE 1. Patient evaluated today. 2. Instructed to maintain good pedal hygiene and foot care. Stressed importance of controlling blood sugar.  3. Mechanical debridement of nails 1-5 bilaterally performed using a nail nipper. Filed with dremel without incident.  4. Prescription for gabapentin 100 mg three times daily provided for patient. 5. Prescription for peripheral neuropathy pain cream to be dispensed from [facility]. 6. Return to clinic in 3 mos.     Edrick Kins, DPM [facility]  Dr. Edrick Kins, [address]                                        [place], [place] [address]                Office  [phone]  Fax [phone]

## 2017-05-31 NOTE — Progress Notes (Deleted)
No chief complaint on file.   History of Present Illness: 56 yo male with history of HTN, HLD, CAD with prior MI, obesity, sleep apnea, tobacco abuse *** and DM who is here today as a new consult, referred by Dr. [name], for the evaluation of chest pain. He tells me today that he ***  He was evaluated in May 2017 in the hospital by Dr. Recardo Evangelist.   Primary Care Physician: [name], DO  Primary Cardiologist: [place]  Past Medical History:  Diagnosis Date  . Adenoidal hypertrophy   . Arthritis   . Diabetes mellitus   . GERD (gastroesophageal reflux disease)   . Heart [place])    [place]  [name] Hyperlipidemia   . Hypertension   . Obesity   . Sleep apnea    cpap  . Tobacco abuse     Past Surgical History:  Procedure Laterality Date  . COLONOSCOPY  05/13/11   9 adenomas  . FOREARM SURGERY    . MUSCLE BIOPSY      Current Outpatient Medications  Medication Sig Dispense Refill  . aspirin EC 81 MG tablet Take 81 mg by mouth daily.    . Blood Glucose Monitoring Suppl (TRUE METRIX AIR GLUCOSE METER) w/Device KIT 1 strip 3 (three) times daily by Does not apply route. Use 1 strip to check blood sugar three (3) times daily. Dx:E11.9 1 kit 0  . carvedilol (COREG) 3.125 MG tablet Take 1 tablet (3.125 mg total) 2 (two) times daily with a meal by mouth. For blood pressure 60 tablet 3  . gabapentin (NEURONTIN) 100 MG capsule Take 1 capsule (100 mg total) 3 (three) times daily by mouth. 90 capsule 3  . glucose blood test strip 1 each by Other route See admin instructions. Check blood sugar 3 times daily. Dx: E11.9    . insulin detemir (LEVEMIR) 100 UNIT/ML injection Inject 20 units subcut daily for diabetes 10 mL 3  . Insulin Syringe-Needle U-100 (INSULIN SYRINGE .3CC/29GX1/2") 29G X 1/2" 0.3 ML MISC Inject 1 Syringe 3 (three) times daily as directed. Check blood sugar three times daily. Dx: E11.9 100 each 3  . Lancets MISC by Does not apply route. Use to check blood sugar  three times daily. Dx: E11.9    . lisinopril (PRINIVIL,ZESTRIL) 5 MG tablet Take 1 tablet (5 mg total) daily by mouth. For blood pressure and kidney protection 30 tablet 3  . lovastatin (MEVACOR) 20 MG tablet Take 1 tablet (20 mg total) at bedtime by mouth. For cholesterol 30 tablet 3  . metFORMIN (GLUCOPHAGE) 1000 MG tablet Take 1 tablet (1,000 mg total) 2 (two) times daily with a meal by mouth. For diabetes 60 tablet 3  . nitroGLYCERIN (NITROSTAT) 0.4 MG SL tablet Place 1 tablet (0.4 mg total) every 5 (five) minutes as needed under the tongue for chest pain. 20 tablet 3  . NONFORMULARY OR COMPOUNDED ITEM [facility]  Peripheral Neuropathy Cream- Bupivacaine 1%, Doxepin 3%, Gabapentin 6%, Pentoxifylline 3%, Topiramate 1% Apply 1-2 grams to affected area 3-4 times daily Qty. 120 gm 3 refills 1 each 0   No current facility-administered medications for this visit.     No Known Allergies  Social History   Socioeconomic History  . Marital status: Single    Spouse name: Not on file  . Number of children: Not on file  . Years of education: Not on file  . Highest education level: Not on file  Social Needs  . Emergency planning/management officer  strain: Not on file  . Food insecurity - worry: Not on file  . Food insecurity - inability: Not on file  . Transportation needs - medical: Not on file  . Transportation needs - non-medical: Not on file  Occupational History  . Not on file  Tobacco Use  . Smoking status: Never Smoker  . Smokeless tobacco: Never Used  Substance and Sexual Activity  . Alcohol use: No  . Drug use: No  . Sexual activity: Yes    Birth control/protection: None  Other Topics Concern  . Not on file  Social History Narrative   Social History      Diet?       Do you drink/eat things with caffeine? yes      Marital status?       single                             What year were you married?      Do you live in a house, apartment, assisted living, condo, trailer, etc.? yes       Is it one or more stories? One story      How many persons live in your home?      Do you have any pets in your home? (please list) none      Highest level of education completed? graduate      Current or past profession:      Do you exercise?            no                          Type & how often?      Advanced Directives      Do you have a living will? no      Do you have a DNR form?                                  If not, do you want to discuss one? no      Do you have signed POA/HPOA for forms? no      Functional Status      Do you have difficulty bathing or dressing yourself? no      Do you have difficulty preparing food or eating? no      Do you have difficulty managing your medications? no      Do you have difficulty managing your finances? no      Do you have difficulty affording your medications? no    Family History  Problem Relation Age of Onset  . Heart disease Mother   . Heart attack Mother 11  . Hypertension Mother   . Hyperlipidemia Mother   . Heart disease Father   . Heart attack Father 47  . Hypertension Father   . Hyperlipidemia Father   . Heart attack Sister 77  . Colon cancer Neg Hx   . Stomach cancer Neg Hx     Review of Systems:  As stated in the HPI and otherwise negative.   There were no vitals taken for this visit.  Physical Examination: General: Well developed, well nourished, NAD  HEENT: OP clear, mucus membranes moist  SKIN: warm, dry. No rashes. Neuro: No focal deficits  Musculoskeletal: Muscle strength 5/5 all ext  Psychiatric: Mood and affect normal  Neck: No JVD, no carotid bruits, no thyromegaly, no lymphadenopathy.  Lungs:Clear bilaterally, no wheezes, rhonci, crackles Cardiovascular: Regular rate and rhythm. No murmurs, gallops or rubs. Abdomen:Soft. Bowel sounds present. Non-tender.  Extremities: No lower extremity edema. Pulses are 2 + in the bilateral DP/PT.  EKG:  EKG {ACTION; IS/IS [id number]} ordered  today. The ekg ordered today demonstrates ***  Recent Labs: 05/10/2017: ALT 13; BUN 11; Creat 1.27; Hemoglobin 16.5; Platelets 232; Potassium 4.3; Sodium 135; TSH 1.30   Lipid Panel    Component Value Date/Time   CHOL 250 (H) 05/10/2017 1238   TRIG 172 (H) 05/10/2017 1238   HDL 60 05/10/2017 1238   CHOLHDL 4.2 05/10/2017 1238   VLDL 27 11/21/2015 0545   LDLCALC 166 07/15/2016     Wt Readings from Last 3 Encounters:  05/10/17 166 lb 6.4 oz (75.5 kg)  11/18/16 167 lb (75.8 kg)  03/07/16 163 lb (73.9 kg)     Other studies Reviewed: Additional studies/ records that were reviewed today include: ***. Review of the above records demonstrates: ***   Assessment and Plan:   1. Chest pain: ***  Current medicines are reviewed at length with the patient today.  The patient {ACTIONS; HAS/DOES NOT HAVE:19233} concerns regarding medicines.  The following changes have been made:  {PLAN; NO CHANGE:13088:s}  Labs/ tests ordered today include: *** No orders of the defined types were placed in this encounter.    Disposition:   FU with *** in {gen number [id number]} {TIME; UNITS DAY/WEEK/MONTH:19136}   Signed, [name], MD 05/31/2017 10:38 AM    [facility] [place], [place],   [address] Phone: [phone]; Fax: [phone]

## 2017-06-01 ENCOUNTER — Ambulatory Visit: Payer: Self-pay | Admitting: Cardiovascular Disease

## 2017-06-08 ENCOUNTER — Ambulatory Visit: Payer: Medicare HMO

## 2017-06-20 ENCOUNTER — Encounter: Payer: Self-pay | Admitting: Internal Medicine

## 2017-06-20 ENCOUNTER — Ambulatory Visit (INDEPENDENT_AMBULATORY_CARE_PROVIDER_SITE_OTHER): Payer: Medicare HMO | Admitting: Internal Medicine

## 2017-06-20 VITALS — BP 122/74 | HR 86 | Temp 97.5°F | Ht 67.0 in | Wt 168.2 lb

## 2017-06-20 DIAGNOSIS — Z0000 Encounter for general adult medical examination without abnormal findings: Secondary | ICD-10-CM | POA: Diagnosis not present

## 2017-06-20 DIAGNOSIS — Z23 Encounter for immunization: Secondary | ICD-10-CM | POA: Diagnosis not present

## 2017-06-20 DIAGNOSIS — Z1211 Encounter for screening for malignant neoplasm of colon: Secondary | ICD-10-CM

## 2017-06-20 DIAGNOSIS — G3184 Mild cognitive impairment, so stated: Secondary | ICD-10-CM

## 2017-06-20 MED ORDER — DONEPEZIL HCL 5 MG PO TABS: 5.0000 mg | ORAL_TABLET | Freq: Every day | ORAL | 3 refills | Status: DC

## 2017-06-20 NOTE — Progress Notes (Signed)
Patient ID: Calvin [name], male   DOB: 09-04-1960, 56 y.o.   MRN: [id number]   Location:  [facility]  Place of Service:  OFFICE  Provider: Arletha Grippe, DO  Patient Care Team: [name], DO as PCP - General (Internal Medicine)  Extended Emergency Contact Information Primary Emergency Contact: [facility] Address: [address]          [place], [place] [address] [place] Phone: [phone] Mobile Phone: [phone] Relation: Sister Secondary Emergency Contact: Herring,Martha Address: [place], [place] [address] [place] Phone: [phone] Relation: Other  Code Status:  Goals of Care: Advanced Directive information Advanced Directives 04/14/2017  Does Patient Have a Medical Advance Directive? No  Would patient like information on creating a medical advance directive? No - Patient declined     Chief Complaint  Patient presents with  . Welcome To Medicare    Welcome To Medicare  . Immunizations    Need for Pneu  . Colonoscopy    Colon screening    HPI: Patient is a 56 y.o. male seen in today for welcome to medicare exam.  He saw podiatry Dr Amalia Hailey and nails were trimmed. He was Rx gabapentin 169m TID but was unable to afford medicine for neuropathy.   He has not scheduled eye appt yet as he had to get new phone as old one with his telephone numbers died. He actually missed 105-Dec-2024appt with Dr GEulas Post  He had a family emergency and had to cancel cardiology appt  [facility]  Decreased Interest 0  Down, Depressed, Hopeless 0  PHQ - 2 Score 0    Fall Risk  06/20/2017  Falls in the past year? No   No flowsheet data found.   Health Maintenance  Topic Date Due  . Hepatitis C Screening  109-03-1961 . PNEUMOCOCCAL POLYSACCHARIDE VACCINE (1) 04/12/1962  . FOOT EXAM  04/12/1970  . OPHTHALMOLOGY EXAM  04/12/1970  . COLONOSCOPY  05/12/2014  . HEMOGLOBIN A1C  11/07/2017    . TETANUS/TDAP  09/27/2025  . INFLUENZA VACCINE  Completed  . HIV Screening  Completed    Urinary incontinence? No issues  Functional Status Survey: Is the patient deaf or have difficulty hearing?: No Does the patient have difficulty seeing, even when wearing glasses/contacts?: Yes(blurry) Does the patient have difficulty concentrating, remembering, or making decisions?: Yes(short term memory concerns) Does the patient have difficulty walking or climbing stairs?: No Does the patient have difficulty dressing or bathing?: No Does the patient have difficulty doing errands alone such as visiting a doctor's office or shopping?: No  Exercise? No regular exercise routine  Diet? He does not follow healthy diet; appetite down some days; he prefers baked meats; little to no fruit/vegetables   No exam data present  Hearing: no issues    Dentition: no regular dental care  Pain: none  Past Medical History:  Diagnosis Date  . Adenoidal hypertrophy   . Arthritis   . Diabetes mellitus   . GERD (gastroesophageal reflux disease)   [place]  .Marland KitchenHyperlipidemia   . Hypertension   . Obesity   . Sleep apnea    cpap  . Tobacco abuse     Past Surgical History:  Procedure Laterality Date  . COLONOSCOPY  05/13/11   9 adenomas  . FOREARM SURGERY    . MUSCLE BIOPSY  Family History  Problem Relation Age of Onset  . Heart disease Mother   . Heart attack Mother 27  . Hypertension Mother   . Hyperlipidemia Mother   . Heart disease Father   . Heart attack Father 14  . Hypertension Father   . Hyperlipidemia Father   . Heart attack Sister 57  . Colon cancer Neg Hx   . Stomach cancer Neg Hx    Family Status  Relation Name Status  . Mother  Deceased  . Father  Deceased  . Sister Darliss Ridgel  . Brother [employer]  . Sister Florian Buff  . Sister Diane Alive  . Sister [employer]  . Neg Hx  (Not Specified)    Social History   Socioeconomic  History  . Marital status: Single    Spouse name: Not on file  . Number of children: Not on file  . Years of education: Not on file  . Highest education level: Not on file  Social Needs  . Financial resource strain: Not on file  . Food insecurity - worry: Not on file  . Food insecurity - inability: Not on file  . Transportation needs - medical: Not on file  . Transportation needs - non-medical: Not on file  Occupational History  . Not on file  Tobacco Use  . Smoking status: Never Smoker  . Smokeless tobacco: Never Used  Substance and Sexual Activity  . Alcohol use: No  . Drug use: No  . Sexual activity: Yes    Birth control/protection: None  Other Topics Concern  . Not on file  Social History Narrative   Social History      Diet?       Do you drink/eat things with caffeine? yes      Marital status?       single                             What year were you married?      Do you live in a house, apartment, assisted living, condo, trailer, etc.? yes      Is it one or more stories? One story      How many persons live in your home?      Do you have any pets in your home? (please list) none      Highest level of education completed? graduate      Current or past profession:      Do you exercise?            no                          Type & how often?      Advanced Directives      Do you have a living will? no      Do you have a DNR form?                                  If not, do you want to discuss one? no      Do you have signed POA/HPOA for forms? no      Functional Status      Do you have difficulty bathing or dressing yourself? no      Do you have difficulty preparing food or eating? no  Do you have difficulty managing your medications? no      Do you have difficulty managing your finances? no      Do you have difficulty affording your medications? no    No Known Allergies  Allergies as of 06/20/2017   No Known Allergies     Medication  List        Accurate as of 06/20/17 10:51 AM. Always use your most recent med list.          aspirin EC 81 MG tablet Take 81 mg by mouth daily.   carvedilol 3.125 MG tablet Commonly known as:  COREG Take 1 tablet (3.125 mg total) 2 (two) times daily with a meal by mouth. For blood pressure   gabapentin 100 MG capsule Commonly known as:  NEURONTIN Take 1 capsule (100 mg total) 3 (three) times daily by mouth.   glucose blood test strip 1 each by Other route See admin instructions. Check blood sugar 3 times daily. Dx: E11.9   insulin detemir 100 UNIT/ML injection Commonly known as:  LEVEMIR Inject 20 units subcut daily for diabetes   INSULIN SYRINGE .3CC/29GX1/2" 29G X 1/2" 0.3 ML Misc Inject 1 Syringe 3 (three) times daily as directed. Check blood sugar three times daily. Dx: E11.9   Lancets Misc by Does not apply route. Use to check blood sugar three times daily. Dx: E11.9   lisinopril 5 MG tablet Commonly known as:  PRINIVIL,ZESTRIL Take 1 tablet (5 mg total) daily by mouth. For blood pressure and kidney protection   lovastatin 20 MG tablet Commonly known as:  MEVACOR Take 1 tablet (20 mg total) at bedtime by mouth. For cholesterol   metFORMIN 1000 MG tablet Commonly known as:  GLUCOPHAGE Take 1 tablet (1,000 mg total) 2 (two) times daily with a meal by mouth. For diabetes   nitroGLYCERIN 0.4 MG SL tablet Commonly known as:  NITROSTAT Place 1 tablet (0.4 mg total) every 5 (five) minutes as needed under the tongue for chest pain.   NONFORMULARY OR COMPOUNDED ITEM [facility]  Peripheral Neuropathy Cream- Bupivacaine 1%, Doxepin 3%, Gabapentin 6%, Pentoxifylline 3%, Topiramate 1% Apply 1-2 grams to affected area 3-4 times daily Qty. 120 gm 3 refills   TRUE METRIX AIR GLUCOSE METER w/Device Kit 1 strip 3 (three) times daily by Does not apply route. Use 1 strip to check blood sugar three (3) times daily. Dx:E11.9        Review of Systems:  Review of  Systems  Constitutional: Positive for fatigue.  Neurological: Positive for numbness.  Psychiatric/Behavioral: Positive for sleep disturbance.  All other systems reviewed and are negative.   Physical Exam: Vitals:   06/20/17 1009  BP: 122/74  Pulse: 86  Temp: (!) 97.5 F (36.4 C)  TempSrc: Oral  SpO2: 99%  Weight: 168 lb 3.2 oz (76.3 kg)  Height: 5' 7" (1.702 m)   Body mass index is 26.34 kg/m. Physical Exam  Constitutional: He appears well-developed and well-nourished.  Psychiatric: He has a normal mood and affect. His speech is normal and behavior is normal. Judgment and thought content normal. Cognition and memory are impaired.   MMSE - Mini Mental State Exam 06/20/2017  Orientation to time 5  Orientation to Place 5  Registration 3  Attention/ Calculation 0  Recall 1  Language- name 2 objects 2  Language- repeat 1  Language- follow 3 step command 2  Language- read & follow direction 1  Write a sentence 0  Copy design 1  Total score 21    Labs reviewed:  Basic Metabolic Panel: Recent Labs    01/13/17 1527 01/13/17 1902 03/18/17 1220 05/10/17 1238  NA 132* 140 133* 135  K 3.9 3.6 3.8 4.3  CL 102 102 102 98  CO2 21*  --  22 28  GLUCOSE 386* 223* 349* 313*  BUN _0 CREATININE 1.19 1.00 1.26* 1.27  CALCIUM 9.0  --  8.9 9.5  TSH  --   --   --  1.30   Liver Function Tests: Recent Labs    07/15/16 11/18/16 1025 05/10/17 1238  AST _1 ALT _2 ALKPHOS 97 80  --   BILITOT  --  0.7 0.6  PROT  --  6.4* 6.8  ALBUMIN  --  3.6  --    No results for input(s): LIPASE, AMYLASE in the last 8760 hours. No results for input(s): AMMONIA in the last 8760 hours. CBC: Recent Labs    11/18/16 1025 01/13/17 1527 01/13/17 1902 03/18/17 1220 05/10/17 1238  WBC 5.0 5.2  --  4.6 5.7  NEUTROABS 2.8  --   --   --  3,164  HGB 15.3 16.0 15.0 15.7 16.5  HCT 44.0 44.8 44.0 45.6 48.2  MCV 88.5 87.8  --  88.2 89.1  PLT 183 205  --  192 232   Lipid  Panel: Recent Labs    07/15/16 05/10/17 1238  CHOL 244* 250*  HDL 54 60  LDLCALC 166  --   TRIG 119 172*  CHOLHDL  --  4.2   Lab Results  Component Value Date   HGBA1C 11.9 (H) 05/10/2017    Procedures: No results found.  Assessment/Plan   ICD-10-CM   1. Welcome to Medicare preventive visit Z00.00   2. Screening for colon cancer Z12.11 Ambulatory referral to Gastroenterology  3. MCI (mild cognitive impairment) G31.84 donepezil (ARICEPT) 5 MG tablet  4. Need for vaccination with 13-polyvalent pneumococcal conjugate vaccine Z23 Pneumococcal conjugate vaccine 13-valent   Follow up as scheduled for routine visit for DM, hyperlipidemia, CP  F/u with specialists as scheduled  prevnar vaccine given today  START ARICEPT 5MG AT BEDTIME FOR MEMORY LOSS  Keeping you healthy handout given   S. Perlie Gold  [facility] [address] [place], [place] [address] [phone] Cell (Monday-Friday 8 AM - 5 PM) [phone] After 5 PM and follow prompts

## 2017-06-20 NOTE — Patient Instructions (Addendum)
Follow up as scheduled for routine visit for DM, hyperlipidemia, CP  prevnar vaccine given today  START ARICEPT 5MG AT BEDTIME FOR MEMORY LOSS  Keeping you healthy  Get these tests  Blood pressure- Have your blood pressure checked once a year by your healthcare provider.  Normal blood pressure is 120/80  Weight- Have your body mass index (BMI) calculated to screen for obesity.  BMI is a measure of body fat based on height and weight. You can also calculate your own BMI at ViewBanking.si.  Cholesterol- Have your cholesterol checked every year.  Diabetes- Have your blood sugar checked regularly if you have high blood pressure, high cholesterol, have a family history of diabetes or if you are overweight.  Screening for Colon Cancer- Colonoscopy starting at age 70.  Screening may begin sooner depending on your family history and other health conditions. Follow up colonoscopy as directed by your Gastroenterologist.  Screening for Prostate Cancer- Both blood work (PSA) and a rectal exam help screen for Prostate Cancer.  Screening begins at age 52 with African-American men and at age 22 with Caucasian men.  Screening may begin sooner depending on your family history.  Take these medicines  Aspirin- One aspirin daily can help prevent Heart disease and Stroke.  Flu shot- Every fall.  Tetanus- Every 10 years.  Zostavax- Once after the age of 46 to prevent Shingles.  Pneumonia shot- Once after the age of 35; if you are younger than 34, ask your healthcare provider if you need a Pneumonia shot.  Take these steps  Don't smoke- If you do smoke, talk to your doctor about quitting.  For tips on how to quit, go to www.smokefree.gov or call 1-800-QUIT-NOW.  Be physically active- Exercise 5 days a week for at least 30 minutes.  If you are not already physically active start slow and gradually work up to 30 minutes of moderate physical activity.  Examples of moderate activity include  walking briskly, mowing the yard, dancing, swimming, bicycling, etc.  Eat a healthy diet- Eat a variety of healthy food such as fruits, vegetables, low fat milk, low fat cheese, yogurt, lean meant, poultry, fish, beans, tofu, etc. For more information go to www.thenutritionsource.org  Drink alcohol in moderation- Limit alcohol intake to less than two drinks a day. Never drink and drive.  Dentist- Brush and floss twice daily; visit your dentist twice a year.  Depression- Your emotional health is as important as your physical health. If you're feeling down, or losing interest in things you would normally enjoy please talk to your healthcare provider.  Eye exam- Visit your eye doctor every year.  Safe sex- If you may be exposed to a sexually transmitted infection, use a condom.  Seat belts- Seat belts can save your life; always wear one.  Smoke/Carbon Monoxide detectors- These detectors need to be installed on the appropriate level of your home.  Replace batteries at least once a year.  Skin cancer- When out in the sun, cover up and use sunscreen 15 SPF or higher.  Violence- If anyone is threatening you, please tell your healthcare provider.  Living Will/ Health care power of attorney- Speak with your healthcare provider and family.   Dementia Dementia means losing some of your brain ability. People with dementia may have problems with:  Memory.  Making decisions.  Behavior.  Speaking.  Thinking.  Solving problems.  Follow these instructions at home: Medicine  Take over-the-counter and prescription medicines only as told by your doctor.  Avoid  taking medicines that can change how you think. These include pain or sleeping medicines. Lifestyle   Make healthy choices: ? Be active as told by your doctor. ? Do not use any tobacco products, such as cigarettes, chewing tobacco, and e-cigarettes. If you need help quitting, ask your doctor. ? Eat a healthy diet. ? When you  get stressed, do something to help yourself relax. Your doctor can give you tips. ? Spend time with other people.  Drink enough fluid to keep your pee (urine) clear or pale yellow.  Make sure you get good sleep. Use these tips to help you get a good night's rest: ? Try not to take naps during the day. ? Keep your sleeping area dark and cool. ? In the few hours before you go to bed, try not to do any exercise. ? Try not to have foods and drinks with caffeine in the evening. General instructions  Talk with your doctor to figure out: ? What you need help with. ? What your safety needs are.  If you were given a bracelet that tracks your location, make sure to wear it.  Keep all follow-up visits as told by your doctor. This is important. Contact a doctor if:  You have any new problems.  You have problems with choking or swallowing.  You have any symptoms of a different sickness. Get help right away if:  You have a fever.  You feel mixed up (confused) or more mixed up than before.  You have new sleepiness.  You have sleepiness that gets worse.  You have a hard time staying awake.  You or your family members are worried for your safety. This information is not intended to replace advice given to you by your health care provider. Make sure you discuss any questions you have with your health care provider. Document Released: 06/02/2008 Document Revised: 11/26/2015 Document Reviewed: 03/18/2015 Elsevier Interactive Patient Education  [employer].

## 2017-06-23 DIAGNOSIS — G3184 Mild cognitive impairment, so stated: Secondary | ICD-10-CM | POA: Insufficient documentation

## 2017-07-03 ENCOUNTER — Telehealth: Payer: Self-pay | Admitting: Internal Medicine

## 2017-07-03 NOTE — Telephone Encounter (Signed)
I spoke with the patient to find out if there are any medications in particular that he needs assistance with affording.  He stated that all of them are expensive.  He has Calvin [name] now but will be switching to [facility] 2019.  I will look into resources for the patient. VDM (DD)

## 2017-07-14 DIAGNOSIS — E113313 Type 2 diabetes mellitus with moderate nonproliferative diabetic retinopathy with macular edema, bilateral: Secondary | ICD-10-CM | POA: Diagnosis not present

## 2017-07-14 DIAGNOSIS — H524 Presbyopia: Secondary | ICD-10-CM | POA: Diagnosis not present

## 2017-07-14 DIAGNOSIS — H52223 Regular astigmatism, bilateral: Secondary | ICD-10-CM | POA: Diagnosis not present

## 2017-07-14 DIAGNOSIS — H2513 Age-related nuclear cataract, bilateral: Secondary | ICD-10-CM | POA: Diagnosis not present

## 2017-07-14 DIAGNOSIS — H35033 Hypertensive retinopathy, bilateral: Secondary | ICD-10-CM | POA: Diagnosis not present

## 2017-07-14 DIAGNOSIS — H5213 Myopia, bilateral: Secondary | ICD-10-CM | POA: Diagnosis not present

## 2017-07-17 ENCOUNTER — Ambulatory Visit: Payer: Self-pay | Admitting: Cardiovascular Disease

## 2017-07-24 ENCOUNTER — Ambulatory Visit (AMBULATORY_SURGERY_CENTER): Payer: Self-pay | Admitting: *Deleted

## 2017-07-24 ENCOUNTER — Other Ambulatory Visit: Payer: Self-pay

## 2017-07-24 VITALS — Ht 66.0 in | Wt 172.4 lb

## 2017-07-24 DIAGNOSIS — Z86010 Personal history of colonic polyps: Secondary | ICD-10-CM

## 2017-07-24 NOTE — Progress Notes (Signed)
Denies allergies to eggs or soy products. Denies complications with sedation or anesthesia. Denies O2 use. Denies use of diet or weight loss medications.  Emmi instructions not given for colonoscopy, pt does not have Internet.

## 2017-07-25 ENCOUNTER — Encounter: Payer: Self-pay | Admitting: Internal Medicine

## 2017-07-25 ENCOUNTER — Ambulatory Visit (INDEPENDENT_AMBULATORY_CARE_PROVIDER_SITE_OTHER): Payer: Medicare HMO | Admitting: Internal Medicine

## 2017-07-25 VITALS — BP 146/74 | HR 93 | Temp 97.4°F | Resp 20 | Ht 67.0 in | Wt 169.6 lb

## 2017-07-25 DIAGNOSIS — I10 Essential (primary) hypertension: Secondary | ICD-10-CM | POA: Diagnosis not present

## 2017-07-25 DIAGNOSIS — G4733 Obstructive sleep apnea (adult) (pediatric): Secondary | ICD-10-CM

## 2017-07-25 DIAGNOSIS — E785 Hyperlipidemia, unspecified: Secondary | ICD-10-CM

## 2017-07-25 DIAGNOSIS — K5900 Constipation, unspecified: Secondary | ICD-10-CM

## 2017-07-25 DIAGNOSIS — Z794 Long term (current) use of insulin: Secondary | ICD-10-CM | POA: Diagnosis not present

## 2017-07-25 DIAGNOSIS — G3184 Mild cognitive impairment, so stated: Secondary | ICD-10-CM | POA: Diagnosis not present

## 2017-07-25 DIAGNOSIS — E1140 Type 2 diabetes mellitus with diabetic neuropathy, unspecified: Secondary | ICD-10-CM | POA: Diagnosis not present

## 2017-07-25 DIAGNOSIS — Z9989 Dependence on other enabling machines and devices: Secondary | ICD-10-CM

## 2017-07-25 MED ORDER — GABAPENTIN 300 MG PO CAPS: 300.0000 mg | ORAL_CAPSULE | Freq: Two times a day (BID) | ORAL | 3 refills | Status: DC

## 2017-07-25 MED ORDER — DOCUSATE SODIUM 100 MG PO CAPS: 200.0000 mg | ORAL_CAPSULE | Freq: Every day | ORAL | 3 refills | Status: DC

## 2017-07-25 MED ORDER — DONEPEZIL HCL 10 MG PO TABS: 10.0000 mg | ORAL_TABLET | Freq: Every day | ORAL | 3 refills | Status: DC

## 2017-07-25 MED ORDER — INSULIN DETEMIR 100 UNIT/ML ~~LOC~~ SOLN: SUBCUTANEOUS | 3 refills | Status: DC
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
## 2017-07-25 NOTE — Patient Instructions (Addendum)
INCREASE DONEPEZIL TO 10MG AT BEDTIME FOR MEMORY  CHANGE GABAPENTIN TO 300MG 2 TIMES DAILY FOR NEUROPATHY  INCREASE LEVEMIR TO 26 UNITS DAILY INJECTION  START COLACE (docusate sodium) 100MG TAKE 2 CAPS DAILY FOR CONSTIPATION (please hold while doing prep for colonoscopy)  Follow up with GI for colonoscopy next month as scheduled  Will need follow up with sleep study and cardiology at next visit  Follow up with eye specialist as scheduled  Follow up in 2 mos for DM, HTN, hyperlipidemia, neuropathy. Fasting labs prior to appt

## 2017-07-27 ENCOUNTER — Encounter: Payer: Self-pay | Admitting: Internal Medicine

## 2017-07-28 ENCOUNTER — Other Ambulatory Visit: Payer: Self-pay

## 2017-07-28 DIAGNOSIS — Z794 Long term (current) use of insulin: Principal | ICD-10-CM

## 2017-07-28 DIAGNOSIS — E1140 Type 2 diabetes mellitus with diabetic neuropathy, unspecified: Secondary | ICD-10-CM

## 2017-07-28 MED ORDER — LOVASTATIN 20 MG PO TABS: 20.0000 mg | ORAL_TABLET | Freq: Every day | ORAL | 1 refills | Status: DC

## 2017-07-28 MED ORDER — GABAPENTIN 300 MG PO CAPS: 300.0000 mg | ORAL_CAPSULE | Freq: Two times a day (BID) | ORAL | 1 refills | Status: DC

## 2017-07-28 MED ORDER — LISINOPRIL 5 MG PO TABS: 5.0000 mg | ORAL_TABLET | Freq: Every day | ORAL | 1 refills | Status: DC

## 2017-07-28 MED ORDER — NITROGLYCERIN 0.4 MG SL SUBL: 0.4000 mg | SUBLINGUAL_TABLET | SUBLINGUAL | 3 refills | Status: DC | PRN

## 2017-07-28 MED ORDER — CARVEDILOL 3.125 MG PO TABS: 3.1250 mg | ORAL_TABLET | Freq: Two times a day (BID) | ORAL | 1 refills | Status: DC

## 2017-07-28 MED ORDER — METFORMIN HCL 1000 MG PO TABS: 1000.0000 mg | ORAL_TABLET | Freq: Two times a day (BID) | ORAL | 1 refills | Status: DC

## 2017-07-28 NOTE — Telephone Encounter (Signed)
Forms for [facility] were completed by patient and prescriptions for the following medications were faxed to [phone].   Carvedilol 3.125 mg Gabapentin 300 mg Lisinopril 5 mg  Lovastatin 20 mg metformin 1000 mg  Nitroglycerin 0.4 mg   Patient was informed that application and prescriptions have been faxed to [facility].

## 2017-08-07 ENCOUNTER — Ambulatory Visit: Payer: Self-pay | Admitting: Cardiovascular Disease

## 2017-08-10 ENCOUNTER — Other Ambulatory Visit: Payer: Self-pay

## 2017-08-10 ENCOUNTER — Ambulatory Visit (AMBULATORY_SURGERY_CENTER): Payer: Medicare HMO | Admitting: Internal Medicine

## 2017-08-10 ENCOUNTER — Encounter: Payer: Self-pay | Admitting: Internal Medicine

## 2017-08-10 VITALS — BP 121/71 | HR 74 | Temp 98.9°F | Resp 13 | Ht 66.0 in | Wt 172.0 lb

## 2017-08-10 DIAGNOSIS — D122 Benign neoplasm of ascending colon: Secondary | ICD-10-CM | POA: Diagnosis not present

## 2017-08-10 DIAGNOSIS — Z1211 Encounter for screening for malignant neoplasm of colon: Secondary | ICD-10-CM | POA: Diagnosis not present

## 2017-08-10 DIAGNOSIS — Z86010 Personal history of colonic polyps: Secondary | ICD-10-CM | POA: Diagnosis present

## 2017-08-10 DIAGNOSIS — D124 Benign neoplasm of descending colon: Secondary | ICD-10-CM

## 2017-08-10 DIAGNOSIS — D123 Benign neoplasm of transverse colon: Secondary | ICD-10-CM

## 2017-08-10 MED ORDER — SODIUM CHLORIDE 0.9 % IV SOLN: 500.0000 mL | Freq: Once | INTRAVENOUS | Status: DC

## 2017-08-10 NOTE — Progress Notes (Signed)
Patient abdomin soft and non distended.  Denies pain or discomfort. Instructed to drink hot coffee or tea. May take mylanta or gas x.

## 2017-08-10 NOTE — Patient Instructions (Addendum)
   I found and removed 7 small polyps today. I will let you know pathology results and when to have another routine colonoscopy by mail and/or My Chart.  I appreciate the opportunity to care for you. [name], MD, FACG    YOU HAD AN ENDOSCOPIC PROCEDURE TODAY: Refer to the procedure report and other information in the discharge instructions given to you for any specific questions about what was found during the examination. If this information does not answer your questions, please call [place] office at [phone] to clarify.   YOU SHOULD EXPECT: Some feelings of bloating in the abdomen. Passage of more gas than usual. Walking can help get rid of the air that was put into your GI tract during the procedure and reduce the bloating. If you had a lower endoscopy (such as a colonoscopy or flexible sigmoidoscopy) you may notice spotting of blood in your stool or on the toilet paper. Some abdominal soreness may be present for a day or two, also.  DIET: Your first meal following the procedure should be a light meal and then it is ok to progress to your normal diet. A half-sandwich or bowl of soup is an example of a good first meal. Heavy or fried foods are harder to digest and may make you feel nauseous or bloated. Drink plenty of fluids but you should avoid alcoholic beverages for 24 hours. If you had a esophageal dilation, please see attached instructions for diet.    ACTIVITY: Your care partner should take you home directly after the procedure. You should plan to take it easy, moving slowly for the rest of the day. You can resume normal activity the day after the procedure however YOU SHOULD NOT DRIVE, use power tools, machinery or perform tasks that involve climbing or major physical exertion for 24 hours (because of the sedation medicines used during the test).   SYMPTOMS TO REPORT IMMEDIATELY: A gastroenterologist can be reached at any hour. Please call [phone]  for any of the  following symptoms:  Following lower endoscopy (colonoscopy, flexible sigmoidoscopy) Excessive amounts of blood in the stool  Significant tenderness, worsening of abdominal pains  Swelling of the abdomen that is new, acute  Fever of 100 or higher   Please see handout on colon polyps.  FOLLOW UP:  If any biopsies were taken you will be contacted by phone or by letter within the next 1-3 weeks. Call [phone]  if you have not heard about the biopsies in 3 weeks.  Please also call with any specific questions about appointments or follow up tests.   Thank you for allowing [place] to provide your health care needs today.

## 2017-08-10 NOTE — Progress Notes (Signed)
Called to room to assist during endoscopic procedure.  Patient ID and intended procedure confirmed with present staff. Received instructions for my participation in the procedure from the performing physician.  

## 2017-08-10 NOTE — Progress Notes (Signed)
To PACU, VSS. Report to [email or web address] 

## 2017-08-10 NOTE — Op Note (Signed)
[facility] Patient Name: Calvin [name] Procedure Date: 08/10/2017 11:52 AM MRN: [id number] Endoscopist: [name] , MD Age: 57 Referring MD:  Date of Birth: 05/29/1961 Gender: Male Account #: [id number] Procedure:                Colonoscopy Indications:              Surveillance: Personal history of adenomatous                            polyps on last colonoscopy > 5 years ago Medicines:                Propofol per Anesthesia, Monitored Anesthesia Care Procedure:                Pre-Anesthesia Assessment:                           - Prior to the procedure, a History and Physical                            was performed, and patient medications and                            allergies were reviewed. The patient's tolerance of                            previous anesthesia was also reviewed. The risks                            and benefits of the procedure and the sedation                            options and risks were discussed with the patient.                            All questions were answered, and informed consent                            was obtained. Prior Anticoagulants: The patient has                            taken no previous anticoagulant or antiplatelet                            agents. ASA Grade Assessment: III - A patient with                            severe systemic disease. After reviewing the risks                            and benefits, the patient was deemed in                            satisfactory condition to undergo the procedure.  After obtaining informed consent, the colonoscope                            was passed under direct vision. Throughout the                            procedure, the patient's blood pressure, pulse, and                            oxygen saturations were monitored continuously. The                            Colonoscope was introduced through the anus and         advanced to the the cecum, identified by                            appendiceal orifice and ileocecal valve. The                            colonoscopy was performed without difficulty. The                            patient tolerated the procedure well. The quality                            of the bowel preparation was good. The ileocecal                            valve, appendiceal orifice, and rectum were                            photographed. The bowel preparation used was                            Miralax. Scope In: 11:57:06 AM Scope Out: 12:15:55 PM Scope Withdrawal Time: 0 hours 9 minutes 57 seconds  Total Procedure Duration: 0 hours 18 minutes 49 seconds  Findings:                 The perianal and digital rectal examinations were                            normal.                           Two sessile polyps were found in the descending                            colon and transverse colon. The polyps were                            diminutive in size. These polyps were removed with                            a cold snare. Resection and  retrieval were                            complete. Verification of patient identification                            for the specimen was done. Estimated blood loss was                            minimal.                           Five sessile polyps were found in the ascending                            colon. The polyps were 1 to 2 mm in size. These                            polyps were removed with a cold biopsy forceps.                            Resection and retrieval were complete. Verification                            of patient identification for the specimen was                            done. Estimated blood loss was minimal.                           The exam was otherwise without abnormality on                            direct and retroflexion views. Complications:            No immediate complications. Estimated  Blood Loss:     Estimated blood loss was minimal. Impression:               - Two diminutive polyps in the descending colon and                            in the transverse colon, removed with a cold snare.                            Resected and retrieved.                           - Five 1 to 2 mm polyps in the ascending colon,                            removed with a cold biopsy forceps. Resected and                            retrieved.                           -  The examination was otherwise normal on direct                            and retroflexion views.                           - Personal history of colonic polyps. 9 adenomas                            2012 Recommendation:           - Patient has a contact number available for                            emergencies. The signs and symptoms of potential                            delayed complications were discussed with the                            patient. Return to normal activities tomorrow.                            Written discharge instructions were provided to the                            patient.                           - Resume previous diet.                           - Continue present medications.                           - Repeat colonoscopy is recommended for                            surveillance. The colonoscopy date will be                            determined after pathology results from today's                            exam become available for review. [name], MD 08/10/2017 12:22:09 PM This report has been signed electronically.

## 2017-08-11 ENCOUNTER — Telehealth: Payer: Self-pay

## 2017-08-11 ENCOUNTER — Telehealth: Payer: Self-pay | Admitting: *Deleted

## 2017-08-11 NOTE — Telephone Encounter (Signed)
No answer, message left for the patient. 

## 2017-08-11 NOTE — Telephone Encounter (Signed)
Attempted to reach patient for post-procedure f/u call. No answer. Left message that we hope is doing well and for him to please call us if he has any questions/concerns regarding his care.

## 2017-08-15 NOTE — Progress Notes (Signed)
Chief Complaint  Patient presents with  . Follow-up    CAD    History of Present Illness: 57 yo male with history of DM, CAD, HLD, HTN, obesity, sleep apnea and GERD who is here today for follow up. He is new to my clinic. He was seen by Dr. Recardo Evangelist in May 2017 as a consult at [facility]. He reports an MI in 31 in [place] but had no stents placed. He was admitted in May 2017 at [facility] with chest pain. He ruled out for an MI with serial troponin. Nuclear stress test with no ischemia. Echo with LVEF=40. No valve disease. His cardiomyopathy was presumed to be non-ischemic. He was discharged on Lisinopril and Coreg. He missed follow-up appointments with Dr. Recardo Evangelist.   He is here today for follow up. He describes chest pain every other day for three years. There is pounding of his heart. He has associated dyspnea. He has occasional palpitations. lower extremity edema, orthopnea, PND, dizziness, near syncope or syncope.    Primary Care Physician: [name], DO   Past Medical History:  Diagnosis Date  . Adenoidal hypertrophy   . Adenomatous colon polyps 2012  . Arthritis   . Diabetes mellitus   . GERD (gastroesophageal reflux disease)   . Heart attack ([place])    [place] living in [place]  [name] Hyperlipidemia   . Hypertension   . Obesity   . Sleep apnea    cpap    Past Surgical History:  Procedure Laterality Date  . COLONOSCOPY  05/13/11   9 adenomas  . FOREARM SURGERY    . MUSCLE BIOPSY      Current Outpatient Medications  Medication Sig Dispense Refill  . amitriptyline (ELAVIL) 25 MG tablet Take 25 mg by mouth at bedtime.    [name] aspirin EC 81 MG tablet Take 81 mg by mouth daily.    . Blood Glucose Monitoring Suppl (TRUE METRIX AIR GLUCOSE METER) w/Device KIT 1 strip 3 (three) times daily by Does not apply route. Use 1 strip to check blood sugar three (3) times daily. Dx:E11.9 1 kit 0  . carvedilol (COREG) 3.125 MG tablet Take 1 tablet (3.125 mg total) by mouth 2 (two) times daily with a  meal. For blood pressure 180 tablet 1  . docusate sodium (COLACE) 100 MG capsule Take 2 capsules (200 mg total) by mouth daily. 60 capsule 3  . donepezil (ARICEPT) 10 MG tablet Take 1 tablet (10 mg total) by mouth at bedtime. 30 tablet 3  . gabapentin (NEURONTIN) 300 MG capsule Take 1 capsule (300 mg total) by mouth 2 (two) times daily. 180 capsule 1  . glucose blood test strip 1 each by Other route See admin instructions. Check blood sugar 3 times daily. Dx: E11.9    . insulin detemir (LEVEMIR) 100 UNIT/ML injection Inject 26 units subcut daily for diabetes 10 mL 3  . Insulin Syringe-Needle U-100 (INSULIN SYRINGE .3CC/29GX1/2") 29G X 1/2" 0.3 ML MISC Inject 1 Syringe 3 (three) times daily as directed. Check blood sugar three times daily. Dx: E11.9 100 each 3  . Lancets MISC by Does not apply route. Use to check blood sugar three times daily. Dx: E11.9    . lisinopril (PRINIVIL,ZESTRIL) 5 MG tablet Take 1 tablet (5 mg total) by mouth daily. For blood pressure and kidney protection 90 tablet 1  . lovastatin (MEVACOR) 20 MG tablet Take 1 tablet (20 mg total) by mouth at bedtime. For cholesterol 90 tablet 1  . metFORMIN (GLUCOPHAGE) 1000  MG tablet Take 1 tablet (1,000 mg total) by mouth 2 (two) times daily with a meal. For diabetes 180 tablet 1  . nitroGLYCERIN (NITROSTAT) 0.4 MG SL tablet Place 1 tablet (0.4 mg total) under the tongue every 5 (five) minutes as needed for chest pain. 30 tablet 3  . NONFORMULARY OR COMPOUNDED ITEM [facility]  Peripheral Neuropathy Cream- Bupivacaine 1%, Doxepin 3%, Gabapentin 6%, Pentoxifylline 3%, Topiramate 1% Apply 1-2 grams to affected area 3-4 times daily Qty. 120 gm 3 refills 1 each 0   Current Facility-Administered Medications  Medication Dose Route Frequency Provider Last Rate Last Dose  . 0.9 %  sodium chloride infusion  500 mL Intravenous Once Gessner, Carl E, MD        No Known Allergies  Social History   Socioeconomic History  . Marital  status: Single    Spouse name: Not on file  . Number of children: 0  . Years of education: Not on file  . Highest education level: Not on file  Social Needs  . Financial resource strain: Not on file  . Food insecurity - worry: Not on file  . Food insecurity - inability: Not on file  . Transportation needs - medical: Not on file  . Transportation needs - non-medical: Not on file  Occupational History  . Occupation: Disability  Tobacco Use  . Smoking status: Never Smoker  . Smokeless tobacco: Never Used  Substance and Sexual Activity  . Alcohol use: No  . Drug use: No  . Sexual activity: Yes    Birth control/protection: None  Other Topics Concern  . Not on file  Social History Narrative   Social History      Diet?       Do you drink/eat things with caffeine? yes      Marital status?       single                             What year were you married?      Do you live in a house, apartment, assisted living, condo, trailer, etc.? yes      Is it one or more stories? One story      How many persons live in your home?      Do you have any pets in your home? (please list) none      Highest level of education completed? graduate      Current or past profession:      Do you exercise?            no                          Type & how often?      Advanced Directives      Do you have a living will? no      Do you have a DNR form?                                  If not, do you want to discuss one? no      Do you have signed POA/HPOA for forms? no      Functional Status      Do you have difficulty bathing or dressing yourself? no      Do you have difficulty preparing food or eating?   no      Do you have difficulty managing your medications? no      Do you have difficulty managing your finances? no      Do you have difficulty affording your medications? no    Family History  Problem Relation Age of Onset  . Heart disease Mother   . Heart attack Mother 50  .  Hypertension Mother   . Hyperlipidemia Mother   . Heart disease Father   . Heart attack Father 60  . Hypertension Father   . Hyperlipidemia Father   . Heart attack Sister 53  . Colon cancer Neg Hx   . Stomach cancer Neg Hx   . Esophageal cancer Neg Hx   . Rectal cancer Neg Hx     Review of Systems:  As stated in the HPI and otherwise negative.   BP 122/70   Pulse 85   Ht 5' 6" (1.676 m)   Wt 170 lb (77.1 kg)   SpO2 98%   BMI 27.44 kg/m   Physical Examination: General: Well developed, well nourished, NAD  HEENT: OP clear, mucus membranes moist  SKIN: warm, dry. No rashes. Neuro: No focal deficits  Musculoskeletal: Muscle strength 5/5 all ext  Psychiatric: Mood and affect normal  Neck: No JVD, no carotid bruits, no thyromegaly, no lymphadenopathy.  Lungs:Clear bilaterally, no wheezes, rhonci, crackles Cardiovascular: Regular rate and rhythm. No murmurs, gallops or rubs. Abdomen:Soft. Bowel sounds present. Non-tender.  Extremities: No lower extremity edema. Pulses are 2 + in the bilateral DP/PT.  Echo may 2017: - Left ventricle: The cavity size was mildly dilated. Wall   thickness was normal. The estimated ejection fraction was 40%.   Diffuse hypokinesis. Doppler parameters are consistent with   abnormal left ventricular relaxation (grade 1 diastolic   dysfunction). - Atrial septum: No defect or patent foramen ovale was identified.  EKG:  EKG is ordered today. The ekg ordered today demonstrates NSR, rate 85 bpm. LVH. Possible prior septal infarct. T wave inversions lateral, anterolateral and inferior leads. Unchanged from EKG in 2017.   Recent Labs: 05/10/2017: ALT 13; BUN 11; Creat 1.27; Hemoglobin 16.5; Platelets 232; Potassium 4.3; Sodium 135; TSH 1.30   Lipid Panel    Component Value Date/Time   CHOL 250 (H) 05/10/2017 1238   TRIG 172 (H) 05/10/2017 1238   HDL 60 05/10/2017 1238   CHOLHDL 4.2 05/10/2017 1238   VLDL 27 11/21/2015 0545   LDLCALC 166 07/15/2016       Wt Readings from Last 3 Encounters:  08/16/17 170 lb (77.1 kg)  08/10/17 172 lb (78 kg)  07/25/17 169 lb 9.6 oz (76.9 kg)     Other studies Reviewed: Additional studies/ records that were reviewed today include:  Review of the above records demonstrates:    Assessment and Plan:   1. CAD with unstable angina: Reported CAD by cath in [place] in 2015 but those cath records are not available for review. He is on ASA and a beta blocker. He is having chest pain several days per week. Cannot exclude unstable angina. Will arrange right and left heart cath at [facility] 08/25/17 with possible PCI.  I have reviewed the risks, indications, and alternatives to cardiac catheterization, possible angioplasty, and stenting with the patient. Risks include but are not limited to bleeding, infection, vascular injury, stroke, myocardial infection, arrhythmia, kidney injury, radiation-related injury in the case of prolonged fluoroscopy use, emergency cardiac surgery, and death. The patient understands the risks of serious complication is 1-2   in 1000 with diagnostic cardiac cath and 1-2% or less with angioplasty/stenting. Continue ASA, statin and beta blocker.   2. Cardiomyopathy: Ongoing dyspnea. No weight change or evidence of CHF. Will repeat echo now to assess LVEF. Right and left heart cath as above.   3. HTN: BP controlled  4. HLD: continue statin  Current medicines are reviewed at length with the patient today.  The patient does not have concerns regarding medicines.  The following changes have been made:  no change  Labs/ tests ordered today include:   Orders Placed This Encounter  Procedures  . Basic Metabolic Panel (BMET)  . CBC  . INR/PT  . EKG 12-Lead  . ECHOCARDIOGRAM COMPLETE     Disposition:   FU with me or office APP 3-4 weeks.    Signed, [name], MD 08/16/2017 10:28 AM    [address], [place], [place]  [address] Phone: [phone]; Fax: [phone]

## 2017-08-15 NOTE — H&P (View-Only) (Signed)
Chief Complaint  Patient presents with  . Follow-up    CAD    History of Present Illness: 57 yo male with history of DM, CAD, HLD, HTN, obesity, sleep apnea and GERD who is here today for follow up. He is new to my clinic. He was seen by Dr. Recardo Evangelist in May 2017 as a consult at [facility]. He reports an MI in 31 in [place] but had no stents placed. He was admitted in May 2017 at [facility] with chest pain. He ruled out for an MI with serial troponin. Nuclear stress test with no ischemia. Echo with LVEF=40. No valve disease. His cardiomyopathy was presumed to be non-ischemic. He was discharged on Lisinopril and Coreg. He missed follow-up appointments with Dr. Recardo Evangelist.   He is here today for follow up. He describes chest pain every other day for three years. There is pounding of his heart. He has associated dyspnea. He has occasional palpitations. lower extremity edema, orthopnea, PND, dizziness, near syncope or syncope.    Primary Care Physician: [name], DO   Past Medical History:  Diagnosis Date  . Adenoidal hypertrophy   . Adenomatous colon polyps 2012  . Arthritis   . Diabetes mellitus   . GERD (gastroesophageal reflux disease)   . Heart attack ([place])    [place] living in [place]  [name] Hyperlipidemia   . Hypertension   . Obesity   . Sleep apnea    cpap    Past Surgical History:  Procedure Laterality Date  . COLONOSCOPY  05/13/11   9 adenomas  . FOREARM SURGERY    . MUSCLE BIOPSY      Current Outpatient Medications  Medication Sig Dispense Refill  . amitriptyline (ELAVIL) 25 MG tablet Take 25 mg by mouth at bedtime.    [name] aspirin EC 81 MG tablet Take 81 mg by mouth daily.    . Blood Glucose Monitoring Suppl (TRUE METRIX AIR GLUCOSE METER) w/Device KIT 1 strip 3 (three) times daily by Does not apply route. Use 1 strip to check blood sugar three (3) times daily. Dx:E11.9 1 kit 0  . carvedilol (COREG) 3.125 MG tablet Take 1 tablet (3.125 mg total) by mouth 2 (two) times daily with a  meal. For blood pressure 180 tablet 1  . docusate sodium (COLACE) 100 MG capsule Take 2 capsules (200 mg total) by mouth daily. 60 capsule 3  . donepezil (ARICEPT) 10 MG tablet Take 1 tablet (10 mg total) by mouth at bedtime. 30 tablet 3  . gabapentin (NEURONTIN) 300 MG capsule Take 1 capsule (300 mg total) by mouth 2 (two) times daily. 180 capsule 1  . glucose blood test strip 1 each by Other route See admin instructions. Check blood sugar 3 times daily. Dx: E11.9    . insulin detemir (LEVEMIR) 100 UNIT/ML injection Inject 26 units subcut daily for diabetes 10 mL 3  . Insulin Syringe-Needle U-100 (INSULIN SYRINGE .3CC/29GX1/2") 29G X 1/2" 0.3 ML MISC Inject 1 Syringe 3 (three) times daily as directed. Check blood sugar three times daily. Dx: E11.9 100 each 3  . Lancets MISC by Does not apply route. Use to check blood sugar three times daily. Dx: E11.9    . lisinopril (PRINIVIL,ZESTRIL) 5 MG tablet Take 1 tablet (5 mg total) by mouth daily. For blood pressure and kidney protection 90 tablet 1  . lovastatin (MEVACOR) 20 MG tablet Take 1 tablet (20 mg total) by mouth at bedtime. For cholesterol 90 tablet 1  . metFORMIN (GLUCOPHAGE) 1000  MG tablet Take 1 tablet (1,000 mg total) by mouth 2 (two) times daily with a meal. For diabetes 180 tablet 1  . nitroGLYCERIN (NITROSTAT) 0.4 MG SL tablet Place 1 tablet (0.4 mg total) under the tongue every 5 (five) minutes as needed for chest pain. 30 tablet 3  . NONFORMULARY OR COMPOUNDED ITEM [facility]  Peripheral Neuropathy Cream- Bupivacaine 1%, Doxepin 3%, Gabapentin 6%, Pentoxifylline 3%, Topiramate 1% Apply 1-2 grams to affected area 3-4 times daily Qty. 120 gm 3 refills 1 each 0   Current Facility-Administered Medications  Medication Dose Route Frequency Provider Last Rate Last Dose  . 0.9 %  sodium chloride infusion  500 mL Intravenous Once [name], MD        No Known Allergies  Social History   Socioeconomic History  . Marital  status: Single    Spouse name: Not on file  . Number of children: 0  . Years of education: Not on file  . Highest education level: Not on file  Social Needs  . Financial resource strain: Not on file  . Food insecurity - worry: Not on file  . Food insecurity - inability: Not on file  . Transportation needs - medical: Not on file  . Transportation needs - non-medical: Not on file  Occupational History  . Occupation: Disability  Tobacco Use  . Smoking status: Never Smoker  . Smokeless tobacco: Never Used  Substance and Sexual Activity  . Alcohol use: No  . Drug use: No  . Sexual activity: Yes    Birth control/protection: None  Other Topics Concern  . Not on file  Social History Narrative   Social History      Diet?       Do you drink/eat things with caffeine? yes      Marital status?       single                             What year were you married?      Do you live in a house, apartment, assisted living, condo, trailer, etc.? yes      Is it one or more stories? One story      How many persons live in your home?      Do you have any pets in your home? (please list) none      Highest level of education completed? graduate      Current or past profession:      Do you exercise?            no                          Type & how often?      Advanced Directives      Do you have a living will? no      Do you have a DNR form?                                  If not, do you want to discuss one? no      Do you have signed POA/HPOA for forms? no      Functional Status      Do you have difficulty bathing or dressing yourself? no      Do you have difficulty preparing food or eating?  no      Do you have difficulty managing your medications? no      Do you have difficulty managing your finances? no      Do you have difficulty affording your medications? no    Family History  Problem Relation Age of Onset  . Heart disease Mother   . Heart attack Mother 8  .  Hypertension Mother   . Hyperlipidemia Mother   . Heart disease Father   . Heart attack Father 16  . Hypertension Father   . Hyperlipidemia Father   . Heart attack Sister 14  . Colon cancer Neg Hx   . Stomach cancer Neg Hx   . Esophageal cancer Neg Hx   . Rectal cancer Neg Hx     Review of Systems:  As stated in the HPI and otherwise negative.   BP 122/70   Pulse 85   Ht _0  (1.676 m)   Wt 170 lb (77.1 kg)   SpO2 98%   BMI 27.44 kg/m   Physical Examination: General: Well developed, well nourished, NAD  HEENT: OP clear, mucus membranes moist  SKIN: warm, dry. No rashes. Neuro: No focal deficits  Musculoskeletal: Muscle strength 5/5 all ext  Psychiatric: Mood and affect normal  Neck: No JVD, no carotid bruits, no thyromegaly, no lymphadenopathy.  Lungs:Clear bilaterally, no wheezes, rhonci, crackles Cardiovascular: Regular rate and rhythm. No murmurs, gallops or rubs. Abdomen:Soft. Bowel sounds present. Non-tender.  Extremities: No lower extremity edema. Pulses are 2 + in the bilateral DP/PT.  Echo may 2017: - Left ventricle: The cavity size was mildly dilated. Wall   thickness was normal. The estimated ejection fraction was 40%.   Diffuse hypokinesis. Doppler parameters are consistent with   abnormal left ventricular relaxation (grade 1 diastolic   dysfunction). - Atrial septum: No defect or patent foramen ovale was identified.  EKG:  EKG is ordered today. The ekg ordered today demonstrates NSR, rate 85 bpm. LVH. Possible prior septal infarct. T wave inversions lateral, anterolateral and inferior leads. Unchanged from EKG in 2017.   Recent Labs: 05/10/2017: ALT 13; BUN 11; Creat 1.27; Hemoglobin 16.5; Platelets 232; Potassium 4.3; Sodium 135; TSH 1.30   Lipid Panel    Component Value Date/Time   CHOL 250 (H) 05/10/2017 1238   TRIG 172 (H) 05/10/2017 1238   HDL 60 05/10/2017 1238   CHOLHDL 4.2 05/10/2017 1238   VLDL 27 11/21/2015 0545   LDLCALC 166 07/15/2016       Wt Readings from Last 3 Encounters:  08/16/17 170 lb (77.1 kg)  08/10/17 172 lb (78 kg)  07/25/17 169 lb 9.6 oz (76.9 kg)     Other studies Reviewed: Additional studies/ records that were reviewed today include:  Review of the above records demonstrates:    Assessment and Plan:   1. CAD with unstable angina: Reported CAD by cath in [place] in 2015 but those cath records are not available for review. He is on ASA and a beta blocker. He is having chest pain several days per week. Cannot exclude unstable angina. Will arrange right and left heart cath at [facility] 08/25/17 with possible PCI.  I have reviewed the risks, indications, and alternatives to cardiac catheterization, possible angioplasty, and stenting with the patient. Risks include but are not limited to bleeding, infection, vascular injury, stroke, myocardial infection, arrhythmia, kidney injury, radiation-related injury in the case of prolonged fluoroscopy use, emergency cardiac surgery, and death. The patient understands the risks of serious complication is 1-2  in 1000 with diagnostic cardiac cath and 1-2% or less with angioplasty/stenting. Continue ASA, statin and beta blocker.   2. Cardiomyopathy: Ongoing dyspnea. No weight change or evidence of CHF. Will repeat echo now to assess LVEF. Right and left heart cath as above.   3. HTN: BP controlled  4. HLD: continue statin  Current medicines are reviewed at length with the patient today.  The patient does not have concerns regarding medicines.  The following changes have been made:  no change  Labs/ tests ordered today include:   Orders Placed This Encounter  Procedures  . Basic Metabolic Panel (BMET)  . CBC  . INR/PT  . EKG 12-Lead  . ECHOCARDIOGRAM COMPLETE     Disposition:   FU with me or office APP 3-4 weeks.    Signed, [name], MD 08/16/2017 10:28 AM    [address], [place], [place]  [address] Phone: [phone]; Fax: [phone]

## 2017-08-16 ENCOUNTER — Encounter: Payer: Self-pay | Admitting: Cardiovascular Disease

## 2017-08-16 ENCOUNTER — Ambulatory Visit (INDEPENDENT_AMBULATORY_CARE_PROVIDER_SITE_OTHER): Payer: Medicare HMO | Admitting: Cardiovascular Disease

## 2017-08-16 VITALS — BP 122/70 | HR 85 | Ht 66.0 in | Wt 170.0 lb

## 2017-08-16 DIAGNOSIS — I25110 Atherosclerotic heart disease of native coronary artery with unstable angina pectoris: Secondary | ICD-10-CM | POA: Diagnosis not present

## 2017-08-16 DIAGNOSIS — I428 Other cardiomyopathies: Secondary | ICD-10-CM | POA: Diagnosis not present

## 2017-08-16 NOTE — Patient Instructions (Addendum)
Medication Instructions:  Your physician recommends that you continue on your current medications as directed. Please refer to the Current Medication list given to you today.   Labwork: Lab work to be done today--BMP, CBC, PT  Testing/Procedures: Your physician has requested that you have an echocardiogram. Echocardiography is a painless test that uses sound waves to create images of your heart. It provides your doctor with information about the size and shape of your heart and how well your heart's chambers and valves are working. This procedure takes approximately one hour. There are no restrictions for this procedure.  Your physician has requested that you have a cardiac catheterization. Cardiac catheterization is used to diagnose and/or treat various heart conditions. Doctors may recommend this procedure for a number of different reasons. The most common reason is to evaluate chest pain. Chest pain can be a symptom of coronary artery disease (CAD), and cardiac catheterization can show whether plaque is narrowing or blocking your heart's arteries. This procedure is also used to evaluate the valves, as well as measure the blood flow and oxygen levels in different parts of your heart. For further information please visit [email or web address]. Please follow instruction sheet, as given.  Scheduled for 08/25/17  Follow-Up: Your physician recommends that you schedule a follow-up appointment in: 3-4 weeks with NP or PA    Any Other Special Instructions Will Be Listed Below (If Applicable).    [place] OFFICE [address] [place] [address] [place] [address] Dept: [phone] Loc: [phone]  Julias Mould  08/16/2017  You are scheduled for a Cardiac Catheterization on Friday, February 22 with Dr. [name].  1. Please arrive at the [facility] (Main Entrance A) at [facility]: [place], [place] [address] at 10:00 AM (two hours before your procedure to ensure your preparation). Free valet parking service is available.   Special note: Every effort is made to have your procedure done on time. Please understand that emergencies sometimes delay scheduled procedures.  2. Diet: Do not eat or drink anything after midnight prior to your procedure except sips of water to take medications.  3. Labs: Lab work was done in office on 08/16/17  4. Medication instructions in preparation for your procedure:  Do not take metformin the  evening before the procedure, morning of the procedure and for 48 hours after procedure. Do not take any insulin or diabetes medication the morning of the procedure      On the morning of your procedure, take your Aspirin and any morning medicines NOT listed above.  You may use sips of water.  5. Plan for one night stay--bring personal belongings. 6. Bring a current list of your medications and current insurance cards. 7. You MUST have a responsible person to drive you home. 8. Someone MUST be with you the first 24 hours after you arrive home or your discharge will be delayed. 9. Please wear clothes that are easy to get on and off and wear slip-on shoes.  Thank you for allowing [place] to care for you!   -- [facility]    If you need a refill on your cardiac medications before your next appointment, please call your pharmacy.

## 2017-08-17 LAB — CBC
Hematocrit: 44.7 % (ref 37.5–51.0)
Hemoglobin: 15.6 g/dL (ref 13.0–17.7)
MCH: 30.9 pg (ref 26.6–33.0)
MCHC: 34.9 g/dL (ref 31.5–35.7)
MCV: 89 fL (ref 79–97)
Platelets: 226 10*3/uL (ref 150–379)
RBC: 5.05 x10E6/uL (ref 4.14–5.80)
RDW: 13 % (ref 12.3–15.4)
WBC: 5.6 10*3/uL (ref 3.4–10.8)

## 2017-08-17 LAB — BASIC METABOLIC PANEL
BUN/Creatinine Ratio: 9 (ref 9–20)
BUN: 11 mg/dL (ref 6–24)
CO2: 22 mmol/L (ref 20–29)
Calcium: 9.3 mg/dL (ref 8.7–10.2)
Chloride: 100 mmol/L (ref 96–106)
Creatinine, Ser: 1.24 mg/dL (ref 0.76–1.27)
GFR calc Af Amer: 74 mL/min/{1.73_m2} (ref >59)
GFR calc non Af Amer: 64 mL/min/{1.73_m2} (ref >59)
Glucose: 254 mg/dL — ABNORMAL HIGH (ref 65–99)
Potassium: 4.1 mmol/L (ref 3.5–5.2)
Sodium: 137 mmol/L (ref 134–144)

## 2017-08-17 LAB — PROTIME-INR
INR: 1.1 (ref 0.8–1.2)
Prothrombin Time: 11 s (ref 9.1–12.0)

## 2017-08-18 ENCOUNTER — Encounter: Payer: Self-pay | Admitting: Internal Medicine

## 2017-08-18 DIAGNOSIS — Z86010 Personal history of colonic polyps: Secondary | ICD-10-CM

## 2017-08-18 HISTORY — DX: Personal history of colonic polyps: Z86.010

## 2017-08-18 NOTE — Progress Notes (Signed)
5 adenomas recall 2022

## 2017-08-21 ENCOUNTER — Ambulatory Visit: Payer: Medicare HMO | Admitting: Podiatry

## 2017-08-24 ENCOUNTER — Telehealth: Payer: Self-pay | Admitting: *Deleted

## 2017-08-24 ENCOUNTER — Ambulatory Visit (HOSPITAL_COMMUNITY): Payer: Medicare HMO

## 2017-08-24 NOTE — Telephone Encounter (Signed)
Pt contacted pre-catheterization scheduled at [facility] for: Friday February 22,2019 12 noon Verified arrival time and place: [place] at: 10 AM Nothing to eat or drink after midnight night before cath. Verified no known allergies.  Hold: No Metformin 08/24/17, 08/25/17 and 48 hours post procedure. No Insulin AM of cath   Except hold medications AM meds can be  taken pre-cath with sip of water including: ASA 81 mg am of cath   Confirmed patient has responsible person to drive home post procedure and observe patient for 24 hours: yes

## 2017-08-25 ENCOUNTER — Ambulatory Visit (HOSPITAL_COMMUNITY)
Admission: RE | Admit: 2017-08-25 | Discharge: 2017-08-25 | Disposition: A | Discharge status: Home / Self Care | Payer: Medicare HMO | Source: Ambulatory Visit | Attending: Cardiovascular Disease | Admitting: Cardiovascular Disease

## 2017-08-25 ENCOUNTER — Ambulatory Visit (HOSPITAL_COMMUNITY)
Admission: RE | Disposition: A | Discharge status: Home / Self Care | Payer: Self-pay | Source: Ambulatory Visit | Attending: Cardiovascular Disease

## 2017-08-25 DIAGNOSIS — I428 Other cardiomyopathies: Secondary | ICD-10-CM | POA: Diagnosis not present

## 2017-08-25 DIAGNOSIS — I10 Essential (primary) hypertension: Secondary | ICD-10-CM | POA: Insufficient documentation

## 2017-08-25 DIAGNOSIS — K219 Gastro-esophageal reflux disease without esophagitis: Secondary | ICD-10-CM | POA: Diagnosis not present

## 2017-08-25 DIAGNOSIS — Z8249 Family history of ischemic heart disease and other diseases of the circulatory system: Secondary | ICD-10-CM | POA: Insufficient documentation

## 2017-08-25 DIAGNOSIS — E669 Obesity, unspecified: Secondary | ICD-10-CM | POA: Insufficient documentation

## 2017-08-25 DIAGNOSIS — I252 Old myocardial infarction: Secondary | ICD-10-CM | POA: Diagnosis not present

## 2017-08-25 DIAGNOSIS — E785 Hyperlipidemia, unspecified: Secondary | ICD-10-CM | POA: Insufficient documentation

## 2017-08-25 DIAGNOSIS — G4730 Sleep apnea, unspecified: Secondary | ICD-10-CM | POA: Diagnosis not present

## 2017-08-25 DIAGNOSIS — R072 Precordial pain: Secondary | ICD-10-CM

## 2017-08-25 DIAGNOSIS — E119 Type 2 diabetes mellitus without complications: Secondary | ICD-10-CM | POA: Diagnosis not present

## 2017-08-25 DIAGNOSIS — Z7982 Long term (current) use of aspirin: Secondary | ICD-10-CM | POA: Diagnosis not present

## 2017-08-25 DIAGNOSIS — M1990 Unspecified osteoarthritis, unspecified site: Secondary | ICD-10-CM | POA: Insufficient documentation

## 2017-08-25 DIAGNOSIS — I25110 Atherosclerotic heart disease of native coronary artery with unstable angina pectoris: Secondary | ICD-10-CM | POA: Diagnosis not present

## 2017-08-25 DIAGNOSIS — Z794 Long term (current) use of insulin: Secondary | ICD-10-CM | POA: Diagnosis not present

## 2017-08-25 HISTORY — PX: RIGHT/LEFT HEART CATH AND CORONARY ANGIOGRAPHY: CATH118266

## 2017-08-25 LAB — POCT I-STAT 3, ART BLOOD GAS (G3+)
Acid-base deficit: 2 mmol/L (ref 0.0–2.0)
Bicarbonate: 23.9 mmol/L (ref 20.0–28.0)
O2 Saturation: 98 %
TCO2: 25 mmol/L (ref 22–32)
pCO2 arterial: 44.4 mmHg (ref 32.0–48.0)
pH, Arterial: 7.338 — ABNORMAL LOW (ref 7.350–7.450)
pO2, Arterial: 105 mmHg (ref 83.0–108.0)

## 2017-08-25 LAB — POCT I-STAT 3, VENOUS BLOOD GAS (G3P V)
Acid-base deficit: 8 mmol/L — ABNORMAL HIGH (ref 0.0–2.0)
Bicarbonate: 18 mmol/L — ABNORMAL LOW (ref 20.0–28.0)
O2 Saturation: 78 %
TCO2: 19 mmol/L — ABNORMAL LOW (ref 22–32)
pCO2, Ven: 36.2 mmHg — ABNORMAL LOW (ref 44.0–60.0)
pH, Ven: 7.304 (ref 7.250–7.430)
pO2, Ven: 46 mmHg — ABNORMAL HIGH (ref 32.0–45.0)

## 2017-08-25 LAB — GLUCOSE, CAPILLARY
Glucose-Capillary: 281 mg/dL — ABNORMAL HIGH (ref 65–99)
Glucose-Capillary: 244 mg/dL — ABNORMAL HIGH (ref 65–99)

## 2017-08-25 SURGERY — RIGHT/LEFT HEART CATH AND CORONARY ANGIOGRAPHY
Anesthesia: LOCAL

## 2017-08-25 MED ORDER — ASPIRIN 81 MG PO CHEW
81.0000 mg | CHEWABLE_TABLET | ORAL | Status: AC
  Administered 2017-08-25: 81 mg via ORAL

## 2017-08-25 MED ORDER — IOPAMIDOL (ISOVUE-370) INJECTION 76%
INTRAVENOUS | Status: DC | PRN
  Administered 2017-08-25: 90 mL

## 2017-08-25 MED ORDER — SODIUM CHLORIDE 0.9% FLUSH: 3.0000 mL | Freq: Two times a day (BID) | INTRAVENOUS | Status: DC

## 2017-08-25 MED ORDER — SODIUM CHLORIDE 0.9 % IV SOLN: INTRAVENOUS | Status: AC

## 2017-08-25 MED ORDER — HEPARIN (PORCINE) IN NACL 2-0.9 UNIT/ML-% IJ SOLN
INTRAMUSCULAR | Status: AC | PRN
  Administered 2017-08-25 (×2): 500 mL

## 2017-08-25 MED ORDER — MIDAZOLAM HCL 2 MG/2ML IJ SOLN
INTRAMUSCULAR | Status: AC
  Filled 2017-08-25: qty 2

## 2017-08-25 MED ORDER — VERAPAMIL HCL 2.5 MG/ML IV SOLN
INTRAVENOUS | Status: AC
  Filled 2017-08-25: qty 2

## 2017-08-25 MED ORDER — SODIUM CHLORIDE 0.9 % IV SOLN: 250.0000 mL | INTRAVENOUS | Status: DC | PRN

## 2017-08-25 MED ORDER — HEPARIN SODIUM (PORCINE) 1000 UNIT/ML IJ SOLN
INTRAMUSCULAR | Status: DC | PRN
  Administered 2017-08-25: 4000 [IU] via INTRAVENOUS

## 2017-08-25 MED ORDER — HEPARIN SODIUM (PORCINE) 1000 UNIT/ML IJ SOLN
INTRAMUSCULAR | Status: AC
  Filled 2017-08-25: qty 1

## 2017-08-25 MED ORDER — ASPIRIN 81 MG PO CHEW
CHEWABLE_TABLET | ORAL | Status: AC
  Administered 2017-08-25: 81 mg via ORAL
  Filled 2017-08-25: qty 1

## 2017-08-25 MED ORDER — MIDAZOLAM HCL 2 MG/2ML IJ SOLN
INTRAMUSCULAR | Status: DC | PRN
  Administered 2017-08-25: 2 mg via INTRAVENOUS

## 2017-08-25 MED ORDER — SODIUM CHLORIDE 0.9% FLUSH: 3.0000 mL | INTRAVENOUS | Status: DC | PRN

## 2017-08-25 MED ORDER — HEPARIN (PORCINE) IN NACL 2-0.9 UNIT/ML-% IJ SOLN
INTRAMUSCULAR | Status: AC
  Filled 2017-08-25: qty 1000

## 2017-08-25 MED ORDER — SODIUM CHLORIDE 0.9 % IV SOLN
INTRAVENOUS | Status: AC
  Administered 2017-08-25: 11:00:00 via INTRAVENOUS

## 2017-08-25 MED ORDER — VERAPAMIL HCL 2.5 MG/ML IV SOLN
INTRAVENOUS | Status: DC | PRN
  Administered 2017-08-25: 10 mL via INTRA_ARTERIAL

## 2017-08-25 MED ORDER — IOPAMIDOL (ISOVUE-370) INJECTION 76%
INTRAVENOUS | Status: AC
  Filled 2017-08-25: qty 100

## 2017-08-25 MED ORDER — LIDOCAINE HCL (PF) 1 % IJ SOLN
INTRAMUSCULAR | Status: AC
  Filled 2017-08-25: qty 30

## 2017-08-25 MED ORDER — LIDOCAINE HCL (PF) 1 % IJ SOLN
INTRAMUSCULAR | Status: DC | PRN
  Administered 2017-08-25 (×2): 2 mL via INTRADERMAL

## 2017-08-25 MED ORDER — FENTANYL CITRATE (PF) 100 MCG/2ML IJ SOLN
INTRAMUSCULAR | Status: AC
  Filled 2017-08-25: qty 2

## 2017-08-25 MED ORDER — FENTANYL CITRATE (PF) 100 MCG/2ML IJ SOLN
INTRAMUSCULAR | Status: DC | PRN
  Administered 2017-08-25: 50 ug via INTRAVENOUS

## 2017-08-25 SURGICAL SUPPLY — 15 items
CATH 5FR JL3.5 JR4 ANG PIG MP (CATHETERS) ×1 IMPLANT
CATH BALLN WEDGE 5F 110CM (CATHETERS) ×1 IMPLANT
CATH INFINITI 5FR AL1 (CATHETERS) ×1 IMPLANT
CATH VISTA GUIDE 6FR XBLAD3.5 (CATHETERS) ×1 IMPLANT
DEVICE RAD COMP TR BAND LRG (VASCULAR PRODUCTS) ×1 IMPLANT
GLIDESHEATH SLEND SS 6F .021 (SHEATH) ×1 IMPLANT
GUIDEWIRE .025 260CM (WIRE) ×1 IMPLANT
GUIDEWIRE INQWIRE 1.5J.035X260 (WIRE) IMPLANT
INQWIRE 1.5J .035X260CM (WIRE) ×2
KIT HEART LEFT (KITS) ×2 IMPLANT
PACK CARDIAC CATHETERIZATION (CUSTOM PROCEDURE TRAY) ×2 IMPLANT
SHEATH GLIDE SLENDER 4/5FR (SHEATH) ×1 IMPLANT
SYR MEDRAD MARK V 150ML (SYRINGE) ×2 IMPLANT
TRANSDUCER W/STOPCOCK (MISCELLANEOUS) ×2 IMPLANT
TUBING CIL FLEX 10 FLL-RA (TUBING) ×2 IMPLANT

## 2017-08-25 NOTE — Discharge Instructions (Signed)

## 2017-08-25 NOTE — Interval H&P Note (Signed)
History and Physical Interval Note:  08/25/2017 10:41 AM  Calvin [name]  has presented today for cardiac cath with the diagnosis of unstable angina - shortness of breath - cad  The various methods of treatment have been discussed with the patient and family. After consideration of risks, benefits and other options for treatment, the patient has consented to  Procedure(s): RIGHT/LEFT HEART CATH AND CORONARY ANGIOGRAPHY (N/A) as a surgical intervention .  The patient's history has been reviewed, patient examined, no change in status, stable for surgery.  I have reviewed the patient's chart and labs.  Questions were answered to the patient's satisfaction.    Cath Lab Visit (complete for each Cath Lab visit)  Clinical Evaluation Leading to the Procedure:   ACS: No.  Non-ACS:    Anginal Classification: CCS III  Anti-ischemic medical therapy: Minimal Therapy (1 class of medications)  Non-Invasive Test Results: No non-invasive testing performed  Prior CABG: No previous CABG       Calvin [name]

## 2017-08-25 NOTE — Progress Notes (Signed)
L brachial sheath pulled, pressure held 10 minutes. VSS. Level 0. Gauze/tegaderm dsg applied. Clean, dry, intact.

## 2017-08-28 ENCOUNTER — Encounter (HOSPITAL_COMMUNITY): Payer: Self-pay | Admitting: Cardiovascular Disease

## 2017-08-28 ENCOUNTER — Telehealth: Payer: Self-pay | Admitting: *Deleted

## 2017-08-28 DIAGNOSIS — I428 Other cardiomyopathies: Secondary | ICD-10-CM

## 2017-08-28 MED FILL — Heparin Sodium (Porcine) 2 Unit/ML in Sodium Chloride 0.9%: INTRAMUSCULAR | Qty: 1000 | Status: AC

## 2017-08-28 NOTE — Telephone Encounter (Signed)
Pt had echo scheduled for 08/24/17 but he arrived late for this appointment. Per Dr. Angelena Form pt needs to have echo rescheduled. I spoke with pt and scheduled echo for March 4,2019 at 11:30.  Pt aware to arrive at 11:00

## 2017-09-04 ENCOUNTER — Other Ambulatory Visit: Payer: Self-pay

## 2017-09-04 ENCOUNTER — Ambulatory Visit (HOSPITAL_COMMUNITY): Payer: Medicare HMO | Attending: Cardiology

## 2017-09-04 DIAGNOSIS — I509 Heart failure, unspecified: Secondary | ICD-10-CM | POA: Insufficient documentation

## 2017-09-04 DIAGNOSIS — I110 Hypertensive heart disease with heart failure: Secondary | ICD-10-CM | POA: Insufficient documentation

## 2017-09-04 DIAGNOSIS — E119 Type 2 diabetes mellitus without complications: Secondary | ICD-10-CM | POA: Diagnosis not present

## 2017-09-04 DIAGNOSIS — E785 Hyperlipidemia, unspecified: Secondary | ICD-10-CM | POA: Diagnosis not present

## 2017-09-04 DIAGNOSIS — G4733 Obstructive sleep apnea (adult) (pediatric): Secondary | ICD-10-CM | POA: Diagnosis not present

## 2017-09-04 DIAGNOSIS — I428 Other cardiomyopathies: Secondary | ICD-10-CM | POA: Diagnosis not present

## 2017-09-04 LAB — ECHOCARDIOGRAM COMPLETE
Ao-asc: 35 cm
Area-P 1/2: 3.55 cm2
E decel time: 211 msec
E/e' ratio: 9.81
FS: 10 % — AB (ref 28–44)
IVS/LV PW RATIO, ED: 0.82
LA ID, A-P, ES: 33 mm
LA diam end sys: 33 mm
LA diam index: 1.74 cm/m2
LA vol A4C: 35.4 ml
LA vol index: 24.3 mL/m2
LA vol: 46.1 mL
LV E/e' medial: 9.81
LV E/e'average: 9.81
LV PW d: 11 mm — AB (ref 0.6–1.1)
LV e' LATERAL: 7 cm/s
LVOT SV: 48 mL
LVOT VTI: 12.5 cm
LVOT area: 3.8 cm2
LVOT diameter: 22 mm
LVOT peak vel: 71.2 cm/s
Lateral S' vel: 5.81 cm/s
MV Dec: 211
MV pk A vel: 84 m/s
MV pk E vel: 68.7 m/s
P 1/2 time: 62 ms
TAPSE: 17.5 mm
TDI e' lateral: 7
TDI e' medial: 5.62

## 2017-09-15 ENCOUNTER — Ambulatory Visit: Payer: Medicare HMO | Admitting: Cardiology

## 2017-09-15 ENCOUNTER — Encounter: Payer: Self-pay | Admitting: Cardiology

## 2017-09-15 VITALS — BP 120/80 | HR 87 | Ht 66.0 in | Wt 166.0 lb

## 2017-09-15 DIAGNOSIS — E785 Hyperlipidemia, unspecified: Secondary | ICD-10-CM

## 2017-09-15 MED ORDER — ATORVASTATIN CALCIUM 40 MG PO TABS: 40.0000 mg | ORAL_TABLET | Freq: Every day | ORAL | 3 refills | Status: DC

## 2017-09-15 NOTE — Progress Notes (Signed)
09/15/2017 Calvin [name]   07-31-1960  [id number]  Primary Physician [name], DO Primary Cardiologist: Dr. Angelena Form   Reason for Visit/CC: Post Cath F/u + Management of Nonischemic CM/ Systolic HF  HPI:  Calvin [name] is a 57 y.o. male who is being seen today for post cath f/u and recent presentation for chest pain as well as for management of systolic HF/ nonischemic CM.   He was recently seen by Dr. Angelena Form and describes symptoms concerning for unstable angina.  Echocardiogram was obtained which showed cardiomyopathy.  EF was reduced down to 20-25%.  Patient was set up for left heart catheterization 320-second 2019 which showed mild nonobstructive CAD.  Normal filling pressures.  EF measurement my cath was slightly higher than echo at 35-45%. His cardiomyopathy was felt to be nonischemic.  Medical therapy was recommended for CAD as well as for his cardiomyopathy.  He was placed on low-dose carvedilol and lisinopril.  Additional past medical history includes hyperlipidemia and poorly controlled diabetes mellitus, on insulin.    In clinic today, he appears euvolemic on exam. Lungs are CTAB. No LEE. He denies resting dyspnea. No exertional dyspnea. He sleeps with 2 pillows. No chest pain. Denies palpitations but offen feels dizzy. Denies syncope/near syncope. BP this morning, before taking coreg and lisinopril 120/80. Pulse rate 87 bpm. Right radial cath site is stable. 2+ radial pulse.   Cardiac Studies  Cardiac Cath 08/25/17 Procedures   RIGHT/LEFT HEART CATH AND CORONARY ANGIOGRAPHY  Conclusion     Prox RCA to Mid RCA lesion is 30% stenosed.  Mid RCA to Dist RCA lesion is 30% stenosed.  Prox Cx to Dist Cx lesion is 20% stenosed.  Mid LAD lesion is 30% stenosed.  There is mild to moderate left ventricular systolic dysfunction.  LV end diastolic pressure is mildly elevated.  The left ventricular ejection fraction is 35-45% by visual estimate.  There is no  mitral valve regurgitation.   1. Mild non-obstructive CAD 2. Moderate LV systolic dysfunction. Non-ischemic cardiomyopathy 3. Normal filling pressures.     Procedures   RIGHT/LEFT HEART CATH AND CORONARY ANGIOGRAPHY  Conclusion     Prox RCA to Mid RCA lesion is 30% stenosed.  Mid RCA to Dist RCA lesion is 30% stenosed.  Prox Cx to Dist Cx lesion is 20% stenosed.  Mid LAD lesion is 30% stenosed.  There is mild to moderate left ventricular systolic dysfunction.  LV end diastolic pressure is mildly elevated.  The left ventricular ejection fraction is 35-45% by visual estimate.  There is no mitral valve regurgitation.   1. Mild non-obstructive CAD 2. Moderate LV systolic dysfunction. Non-ischemic cardiomyopathy 3. Normal filling pressures.   Recommendations: Medical management of CAD and cardiomyopathy.      2D Echo 09/04/17 Study Conclusions  - Left ventricle: The cavity size was normal. Wall thickness was   increased in a pattern of mild LVH. Systolic function was   severely reduced. The estimated ejection fraction was in the   range of 20% to 25%. Diffuse hypokinesis. Doppler parameters are   consistent with abnormal left ventricular relaxation (grade 1   diastolic dysfunction). - Aortic valve: There was trivial regurgitation.  Impressions:  - Severe global reduction in LV systolic function; mild LVH; mild   diastolic dysfunction; trace AI, MR and TR.    Current Meds  Medication Sig  . aspirin EC 81 MG tablet Take 81 mg by mouth daily.  . Blood Glucose Monitoring Suppl (TRUE METRIX AIR GLUCOSE METER) w/Device  KIT 1 strip 3 (three) times daily by Does not apply route. Use 1 strip to check blood sugar three (3) times daily. Dx:E11.9  . carvedilol (COREG) 3.125 MG tablet Take 1 tablet (3.125 mg total) by mouth 2 (two) times daily with a meal. For blood pressure  . docusate sodium (COLACE) 100 MG capsule Take 2 capsules (200 mg total) by mouth daily.  [name]  donepezil (ARICEPT) 10 MG tablet Take 1 tablet (10 mg total) by mouth at bedtime.  . gabapentin (NEURONTIN) 300 MG capsule Take 1 capsule (300 mg total) by mouth 2 (two) times daily.  . insulin detemir (LEVEMIR) 100 UNIT/ML injection Inject 26 units subcut daily for diabetes  . Insulin Syringe-Needle U-100 (INSULIN SYRINGE .3CC/29GX1/2") 29G X 1/2" 0.3 ML MISC Inject 1 Syringe 3 (three) times daily as directed. Check blood sugar three times daily. Dx: E11.9  . lisinopril (PRINIVIL,ZESTRIL) 5 MG tablet Take 1 tablet (5 mg total) by mouth daily. For blood pressure and kidney protection  . metFORMIN (GLUCOPHAGE) 1000 MG tablet Take 1 tablet (1,000 mg total) by mouth 2 (two) times daily with a meal. For diabetes  . nitroGLYCERIN (NITROSTAT) 0.4 MG SL tablet Place 1 tablet (0.4 mg total) under the tongue every 5 (five) minutes as needed for chest pain.  . NONFORMULARY OR COMPOUNDED ITEM [facility]  Peripheral Neuropathy Cream- Bupivacaine 1%, Doxepin 3%, Gabapentin 6%, Pentoxifylline 3%, Topiramate 1% Apply 1-2 grams to affected area 3-4 times daily Qty. 120 gm 3 refills  . [DISCONTINUED] lovastatin (MEVACOR) 20 MG tablet Take 1 tablet (20 mg total) by mouth at bedtime. For cholesterol   Current Facility-Administered Medications for the 09/15/17 encounter (Office Visit) with Consuelo Pandy, [name]  Medication  . 0.9 %  sodium chloride infusion   No Known Allergies Past Medical History:  Diagnosis Date  . Adenoidal hypertrophy   . Adenomatous colon polyps 2012  . Arthritis   . Diabetes mellitus   . GERD (gastroesophageal reflux disease)   . Heart attack ([place])    While living in [place]  [name] Hx of adenomatous colonic polyps 08/18/2017  . Hyperlipidemia   . Hypertension   . Obesity   . Sleep apnea    cpap   Family History  Problem Relation Age of Onset  . Heart disease Mother   . Heart attack Mother 85  . Hypertension Mother   . Hyperlipidemia Mother   . Heart disease Father   .  Heart attack Father 35  . Hypertension Father   . Hyperlipidemia Father   . Heart attack Sister 73  . Colon cancer Neg Hx   . Stomach cancer Neg Hx   . Esophageal cancer Neg Hx   . Rectal cancer Neg Hx    Past Surgical History:  Procedure Laterality Date  . COLONOSCOPY  05/13/11   9 adenomas  . FOREARM SURGERY    . MUSCLE BIOPSY    . RIGHT/LEFT HEART CATH AND CORONARY ANGIOGRAPHY N/A 08/25/2017   Procedure: RIGHT/LEFT HEART CATH AND CORONARY ANGIOGRAPHY;  Surgeon: [name], MD;  Location: [facility];  Service: Cardiovascular;  Laterality: N/A;   Social History   Socioeconomic History  . Marital status: Single    Spouse name: Not on file  . Number of children: 0  . Years of education: Not on file  . Highest education level: Not on file  Social Needs  . Financial resource strain: Not on file  . Food insecurity - worry: Not on file  .  Food insecurity - inability: Not on file  . Transportation needs - medical: Not on file  . Transportation needs - non-medical: Not on file  Occupational History  . Occupation: Disability  Tobacco Use  . Smoking status: Never Smoker  . Smokeless tobacco: Never Used  Substance and Sexual Activity  . Alcohol use: No  . Drug use: No  . Sexual activity: Yes    Birth control/protection: None  Other Topics Concern  . Not on file  Social History Narrative   Social History      Diet?       Do you drink/eat things with caffeine? yes      Marital status?       single                             What year were you married?      Do you live in a house, apartment, assisted living, condo, trailer, etc.? yes      Is it one or more stories? One story      How many persons live in your home?      Do you have any pets in your home? (please list) none      Highest level of education completed? graduate      Current or past profession:      Do you exercise?            no                          Type & how often?       Advanced Directives      Do you have a living will? no      Do you have a DNR form?                                  If not, do you want to discuss one? no      Do you have signed POA/HPOA for forms? no      Functional Status      Do you have difficulty bathing or dressing yourself? no      Do you have difficulty preparing food or eating? no      Do you have difficulty managing your medications? no      Do you have difficulty managing your finances? no      Do you have difficulty affording your medications? no     Review of Systems: General: negative for chills, fever, night sweats or weight changes.  Cardiovascular: negative for chest pain, dyspnea on exertion, edema, orthopnea, palpitations, paroxysmal nocturnal dyspnea or shortness of breath Dermatological: negative for rash Respiratory: negative for cough or wheezing Urologic: negative for hematuria Abdominal: negative for nausea, vomiting, diarrhea, bright red blood per rectum, melena, or hematemesis Neurologic: negative for visual changes, syncope, or dizziness All other systems reviewed and are otherwise negative except as noted above.   Physical Exam:  Blood pressure 120/80, pulse 87, height _0  (1.676 m), weight 166 lb (75.3 kg), SpO2 99 %.  General appearance: alert, cooperative and no distress Neck: no carotid bruit and no JVD Lungs: clear to auscultation bilaterally Heart: regular rate and rhythm, S1, S2 normal, no murmur, click, rub or gallop Extremities: extremities normal, atraumatic, no cyanosis or edema Pulses: 2+ and symmetric Skin: Skin color,  texture, turgor normal. No rashes or lesions Neurologic: Grossly normal  EKG not performed -- personally reviewed   ASSESSMENT AND PLAN:    1. Nonischemic CM/ Systolic HF: minimal CAD on recent cath. EF by echo 20-25%. EF by cath estimate 35-45%. Continue medical management with Coreg and lisinopril. His BP prior to any medications this morning is 120/80,  thus unable to further titrate HF meds today, as I think he would not have much BP room. He is euvolemic on exam. We discussed low sodium diet and daily weights.   2. CAD: mild nonobstructive as outlined in cath report above. Continue aggressive risk factor reduction. Will change statin for better LDL reduction. Stop Lovastatin. Add Lipitor 40 mg Continue ASA.   3. HLD: LDL was 158 mg/dL 05/2017. Stop Lovastatin. Start Lipitor 40 mg nightly. Recheck FLP and HFTs in 8 weeks. Goal LDL < 70 mg/dL.   4. DM: poorly controlled. Hgb A1c in 05/2017 was 11.9. He is on multiple DM meds. He has f/u with his PCP next month. Needs to get levels down to < 7.0.   PLAN    Follow-Up w/ PharmD in 3 weeks to readust HF meds. F/u with Dr. Angelena Form or myself in 6 weeks.   Mykenzi Vanzile Ladoris Gene, [facility] 09/15/2017 10:45 AM

## 2017-09-15 NOTE — Patient Instructions (Addendum)
Medication Instructions:    STOP TAKING LOVASTATIN  START TAKING LIPITOR 40 MG ONCE  A DAY   If you need a refill on your cardiac medications before your next appointment, please call your pharmacy.  Labwork:  RETURN IN  8 WEEKS FOR FASTING LIPID AND LIVER   Testing/Procedures: NONE ORDERED  TODAY    Follow-Up: 3 WEEKS WITH  [facility]  FOR MEDICATION MANAGEMENT    6  TO 8 WEEKS WITH [facility] OR [facility]   Any Other Special Instructions Will Be Listed Below (If Applicable).

## 2017-09-22 ENCOUNTER — Other Ambulatory Visit: Payer: Medicare HMO

## 2017-09-22 DIAGNOSIS — E1140 Type 2 diabetes mellitus with diabetic neuropathy, unspecified: Secondary | ICD-10-CM | POA: Diagnosis not present

## 2017-09-22 DIAGNOSIS — E785 Hyperlipidemia, unspecified: Secondary | ICD-10-CM | POA: Diagnosis not present

## 2017-09-22 DIAGNOSIS — Z794 Long term (current) use of insulin: Secondary | ICD-10-CM | POA: Diagnosis not present

## 2017-09-23 LAB — COMPLETE METABOLIC PANEL WITH GFR
AG Ratio: 1.6 (calc) (ref 1.0–2.5)
ALT: 17 U/L (ref 9–46)
AST: 16 U/L (ref 10–35)
Albumin: 4 g/dL (ref 3.6–5.1)
Alkaline phosphatase (APISO): 65 U/L (ref 40–115)
BUN: 13 mg/dL (ref 7–25)
CO2: 29 mmol/L (ref 20–32)
Calcium: 9.4 mg/dL (ref 8.6–10.3)
Chloride: 101 mmol/L (ref 98–110)
Creat: 1.13 mg/dL (ref 0.70–1.33)
GFR, Est African American: 83 mL/min/{1.73_m2} (ref >=60)
GFR, Est Non African American: 72 mL/min/{1.73_m2} (ref >=60)
Globulin: 2.5 g/dL (calc) (ref 1.9–3.7)
Glucose, Bld: 264 mg/dL — ABNORMAL HIGH (ref 65–99)
Potassium: 4.4 mmol/L (ref 3.5–5.3)
Sodium: 135 mmol/L (ref 135–146)
Total Bilirubin: 0.6 mg/dL (ref 0.2–1.2)
Total Protein: 6.5 g/dL (ref 6.1–8.1)

## 2017-09-23 LAB — HEMOGLOBIN A1C
Hgb A1c MFr Bld: 11.6 % of total Hgb — ABNORMAL HIGH (ref <5.7)
Mean Plasma Glucose: 286 (calc)
eAG (mmol/L): 15.9 (calc)

## 2017-09-23 LAB — LIPID PANEL
Cholesterol: 208 mg/dL — ABNORMAL HIGH (ref <200)
HDL: 53 mg/dL (ref >40)
LDL Cholesterol (Calc): 135 mg/dL (calc) — ABNORMAL HIGH
Non-HDL Cholesterol (Calc): 155 mg/dL (calc) — ABNORMAL HIGH (ref <130)
Total CHOL/HDL Ratio: 3.9 (calc) (ref <5.0)
Triglycerides: 96 mg/dL (ref <150)

## 2017-09-26 ENCOUNTER — Ambulatory Visit (INDEPENDENT_AMBULATORY_CARE_PROVIDER_SITE_OTHER): Payer: Medicare HMO | Admitting: Internal Medicine

## 2017-09-26 ENCOUNTER — Encounter: Payer: Self-pay | Admitting: Internal Medicine

## 2017-09-26 VITALS — BP 128/70 | HR 80 | Temp 97.7°F | Ht 66.0 in | Wt 167.0 lb

## 2017-09-26 DIAGNOSIS — Z794 Long term (current) use of insulin: Secondary | ICD-10-CM | POA: Diagnosis not present

## 2017-09-26 DIAGNOSIS — E1149 Type 2 diabetes mellitus with other diabetic neurological complication: Secondary | ICD-10-CM

## 2017-09-26 DIAGNOSIS — E1140 Type 2 diabetes mellitus with diabetic neuropathy, unspecified: Secondary | ICD-10-CM

## 2017-09-26 DIAGNOSIS — G3184 Mild cognitive impairment, so stated: Secondary | ICD-10-CM

## 2017-09-26 DIAGNOSIS — E785 Hyperlipidemia, unspecified: Secondary | ICD-10-CM | POA: Diagnosis not present

## 2017-09-26 HISTORY — DX: Type 2 diabetes mellitus with diabetic neuropathy, unspecified: E11.40

## 2017-09-26 MED ORDER — METFORMIN HCL 500 MG/5ML PO SOLN: 1000.0000 mg | Freq: Two times a day (BID) | ORAL | 6 refills | Status: DC

## 2017-09-26 MED ORDER — GABAPENTIN 300 MG/6ML PO SOLN: 300.0000 mg | Freq: Two times a day (BID) | ORAL | 6 refills | Status: DC

## 2017-09-26 NOTE — Progress Notes (Signed)
Location:  [facility]  Provider: DR Arletha Grippe  Goals of Care:  Advanced Directives 08/25/2017  Does Patient Have a Medical Advance Directive? No  Does patient want to make changes to medical advance directive? No - Patient declined  Would patient like information on creating a medical advance directive? Yes (MAU/Ambulatory/Procedural Areas - Information given)     Chief Complaint  Patient presents with  . Medical Management of Chronic Issues    2 month follow-up on DM, HTN Hyperlipidemia, and Neuropathy. DM foot exam today and patient c/o headaches and decreased appetite   . Medication Management    Patient not sure which medications to take. Patient having trouble taking Gabapentin and Metformin due to size of pill   . Health Maintenance    Eye exam due, patient states eye exam is UTD, could not recall name of eye doctor. Discuss need for Hep C screening     HPI: Patient is a 57 y.o. male seen today for medical management of chronic diseases.  He has trouble swallowing larger pills (metformin and gabapentin) and has not taken them in 2 mos.   HTN -controlled on coreg and lisinopril. He takes ASA daily.  Cognitive impairment - stable on aricpet. He still gets confused  DM - BS >400. No low BS reactions. He has numbness/tingling in hands/feet.  He is unable to swallow gabapentin 376m daily. He takes levemir TID; unable to swallow metformin 10075mBID. A1c 11.6% (prev 11.9%). Urine microalbumin/Cr ratio 8; LDL 158  Hyperlipidemia - uncontrolled but improved. He states he has not been taking statin as ordered yet cholesterol improved from 4 mos ago per labs. He saw cardio on 09/15/17 and lipitor 4045mtarted. LDL 135; Tchol 208; TG 96; HDL 53.   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 2D echo in 09/2017 revealed EF 20-25%. He takes ASA daily, coreg and lisinopril. Followed by cardio Dr McAAngelena Forme is on  permanent disability/SSI 2/2 diabetes, sleep apnea.  Living arrangements - he is now living in an apt  Past Medical History:  Diagnosis Date  . Adenoidal hypertrophy   . Adenomatous colon polyps 2012  . Arthritis   . Diabetes mellitus   . GERD (gastroesophageal reflux disease)   . [place]  [place]  . HMarland Kitchen of adenomatous colonic polyps 08/18/2017  . Hyperlipidemia   . Hypertension   . Obesity   . Sleep apnea    cpap    Past Surgical History:  Procedure Laterality Date  . COLONOSCOPY  05/13/11   9 adenomas  . FOREARM SURGERY    . MUSCLE BIOPSY    . RIGHT/LEFT HEART CATH AND CORONARY ANGIOGRAPHY N/A 08/25/2017   Procedure: RIGHT/LEFT HEART CATH AND CORONARY ANGIOGRAPHY;  Surgeon: McABurnell BlanksD;  Location: [facility];  Service: Cardiovascular;  Laterality: N/A;     reports that he has never smoked. He has never used smokeless tobacco. He reports that he does not drink alcohol or use drugs. Social History   Socioeconomic History  . Marital status: Single    Spouse name: Not on file  . Number of children: 0  . Years of education: Not on file  . Highest education level: Not on file  Occupational History  . Occupation: Disability  Social Needs  . Financial resource strain: Not on file  . Food insecurity:    Worry: Not  on file    Inability: Not on file  . Transportation needs:    Medical: Not on file    Non-medical: Not on file  Tobacco Use  . Smoking status: Never Smoker  . Smokeless tobacco: Never Used  Substance and Sexual Activity  . Alcohol use: No  . Drug use: No  . Sexual activity: Yes    Birth control/protection: None  Lifestyle  . Physical activity:    Days per week: Not on file    Minutes per session: Not on file  . Stress: Not on file  Relationships  . Social connections:    Talks on phone: Not on file    Gets together: Not on file    Attends religious service: Not on file    Active member of club or  organization: Not on file    Attends meetings of clubs or organizations: Not on file    Relationship status: Not on file  . Intimate partner violence:    Fear of current or ex partner: Not on file    Emotionally abused: Not on file    Physically abused: Not on file    Forced sexual activity: Not on file  Other Topics Concern  . Not on file  Social History Narrative   Social History      Diet?       Do you drink/eat things with caffeine? yes      Marital status?       single                             What year were you married?      Do you live in a house, apartment, assisted living, condo, trailer, etc.? yes      Is it one or more stories? One story      How many persons live in your home?      Do you have any pets in your home? (please list) none      Highest level of education completed? graduate      Current or past profession:      Do you exercise?            no                          Type & how often?      Advanced Directives      Do you have a living will? no      Do you have a DNR form?                                  If not, do you want to discuss one? no      Do you have signed POA/HPOA for forms? no      Functional Status      Do you have difficulty bathing or dressing yourself? no      Do you have difficulty preparing food or eating? no      Do you have difficulty managing your medications? no      Do you have difficulty managing your finances? no      Do you have difficulty affording your medications? no    Family History  Problem Relation Age of Onset  . Heart disease Mother   . Heart attack Mother 29  .  Hypertension Mother   . Hyperlipidemia Mother   . Heart disease Father   . Heart attack Father 17  . Hypertension Father   . Hyperlipidemia Father   . Heart attack Sister 19  . Colon cancer Neg Hx   . Stomach cancer Neg Hx   . Esophageal cancer Neg Hx   . Rectal cancer Neg Hx     No Known Allergies  Outpatient Encounter  Medications as of 09/26/2017  Medication Sig  . aspirin EC 81 MG tablet Take 81 mg by mouth daily.  . carvedilol (COREG) 3.125 MG tablet Take 1 tablet (3.125 mg total) by mouth 2 (two) times daily with a meal. For blood pressure  . donepezil (ARICEPT) 10 MG tablet Take 1 tablet (10 mg total) by mouth at bedtime.  . insulin detemir (LEVEMIR) 100 UNIT/ML injection Inject 26 units subcut daily for diabetes  . Insulin Syringe-Needle U-100 (INSULIN SYRINGE .3CC/29GX1/2") 29G X 1/2" 0.3 ML MISC Inject 1 Syringe 3 (three) times daily as directed. Check blood sugar three times daily. Dx: E11.9  . lisinopril (PRINIVIL,ZESTRIL) 5 MG tablet Take 1 tablet (5 mg total) by mouth daily. For blood pressure and kidney protection  . nitroGLYCERIN (NITROSTAT) 0.4 MG SL tablet Place 1 tablet (0.4 mg total) under the tongue every 5 (five) minutes as needed for chest pain.  [name] atorvastatin (LIPITOR) 40 MG tablet Take 1 tablet (40 mg total) by mouth daily. (Patient not taking: Reported on 09/26/2017)  . Blood Glucose Monitoring Suppl (TRUE METRIX AIR GLUCOSE METER) w/Device KIT 1 strip 3 (three) times daily by Does not apply route. Use 1 strip to check blood sugar three (3) times daily. Dx:E11.9 (Patient not taking: Reported on 09/26/2017)  . docusate sodium (COLACE) 100 MG capsule Take 2 capsules (200 mg total) by mouth daily. (Patient not taking: Reported on 09/26/2017)  . Gabapentin 300 MG/6ML SOLN Take 300 mg by mouth 2 (two) times daily. For nerve pain  . Metformin HCl 500 MG/5ML SOLN Take 10 mLs (1,000 mg total) by mouth 2 (two) times daily. For diabetes  . [DISCONTINUED] gabapentin (NEURONTIN) 300 MG capsule Take 1 capsule (300 mg total) by mouth 2 (two) times daily. (Patient not taking: Reported on 09/26/2017)  . [DISCONTINUED] metFORMIN (GLUCOPHAGE) 1000 MG tablet Take 1 tablet (1,000 mg total) by mouth 2 (two) times daily with a meal. For diabetes (Patient not taking: Reported on 09/26/2017)  . [DISCONTINUED]  NONFORMULARY OR COMPOUNDED ITEM [facility]  Peripheral Neuropathy Cream- Bupivacaine 1%, Doxepin 3%, Gabapentin 6%, Pentoxifylline 3%, Topiramate 1% Apply 1-2 grams to affected area 3-4 times daily Qty. 120 gm 3 refills (Patient not taking: Reported on 09/26/2017)   Facility-Administered Encounter Medications as of 09/26/2017  Medication  . 0.9 %  sodium chloride infusion    Review of Systems:  Review of Systems  Unable to perform ROS: Other (COGNITIVE IMPAIRMENT)    Health Maintenance  Topic Date Due  . Hepatitis C Screening  [birth year]  . FOOT EXAM  04/12/1970  . OPHTHALMOLOGY EXAM  04/12/1970  . PNEUMOCOCCAL POLYSACCHARIDE VACCINE (1) 06/13/2018 (Originally 04/12/1962)  . HEMOGLOBIN A1C  03/25/2018  . COLONOSCOPY  08/10/2020  . TETANUS/TDAP  09/27/2025  . INFLUENZA VACCINE  Completed  . HIV Screening  Completed    Physical Exam: Vitals:   09/26/17 1042  BP: 128/70  Pulse: 80  Temp: 97.7 F (36.5 C)  TempSrc: Oral  SpO2: 99%  Weight: 167 lb (75.8 kg)  Height: 5' 6"  (1.676 m)  Body mass index is 26.95 kg/m. Physical Exam  Constitutional: He appears well-developed and well-nourished.  HENT:  Mouth/Throat: Oropharynx is clear and moist.  MMM; no oral thrush  Eyes: Pupils are equal, round, and reactive to light. No scleral icterus.  Neck: Neck supple. Carotid bruit is not present. No thyromegaly present.  Cardiovascular: Normal rate, regular rhythm and intact distal pulses. Exam reveals no gallop and no friction rub.  Murmur (1/6 sem) heard. No distal LE edema. No calf TTP  Pulmonary/Chest: Effort normal and breath sounds normal. He has no wheezes. He has no rales. He exhibits no tenderness.  Abdominal: Soft. Normal appearance and bowel sounds are normal. He exhibits no distension, no abdominal bruit, no pulsatile midline mass and no mass. There is no hepatomegaly. There is no tenderness. There is no rigidity, no rebound and no guarding. No hernia.    Musculoskeletal: He exhibits edema.  Lymphadenopathy:    He has no cervical adenopathy.  Neurological: He is alert.  Skin: Skin is warm and dry. No rash noted.  Psychiatric: He has a normal mood and affect. His behavior is normal.   Diabetic Foot Exam - Simple   Simple Foot Form Diabetic Foot exam was performed with the following findings:  Yes 09/26/2017 12:35 PM  Visual Inspection See comments:  Yes Sensation Testing See comments:  Yes Pulse Check Posterior Tibialis and Dorsalis pulse intact bilaterally:  Yes Comments Reduced monofilament testing b/l toes > foot; R>L bunion; no calluses or ulceration; b/l toenail dystrophic changes      Labs reviewed: Basic Metabolic Panel: Recent Labs    05/10/17 1238 08/16/17 1037 09/22/17 0954  NA 135 137 135  K 4.3 4.1 4.4  CL 98 100 101  CO2 28 22 29   GLUCOSE 313* 254* 264*  BUN 11 11 13   CREATININE 1.27 1.24 1.13  CALCIUM 9.5 9.3 9.4  TSH 1.30  --   --    Liver Function Tests: Recent Labs    11/18/16 1025 05/10/17 1238 09/22/17 0954  AST 19 11 16   ALT 20 13 17   ALKPHOS 80  --   --   BILITOT 0.7 0.6 0.6  PROT 6.4* 6.8 6.5  ALBUMIN 3.6  --   --    No results for input(s): LIPASE, AMYLASE in the last 8760 hours. No results for input(s): AMMONIA in the last 8760 hours. CBC: Recent Labs    11/18/16 1025  03/18/17 1220 05/10/17 1238 08/16/17 1037  WBC 5.0   < > 4.6 5.7 5.6  NEUTROABS 2.8  --   --  3,164  --   HGB 15.3   < > 15.7 16.5 15.6  HCT 44.0   < > 45.6 48.2 44.7  MCV 88.5   < > 88.2 89.1 89  PLT 183   < > 192 232 226   < > = values in this interval not displayed.   Lipid Panel: Recent Labs    05/10/17 1238 09/22/17 0954  CHOL 250* 208*  HDL 60 53  LDLCALC 158* 135*  TRIG 172* 96  CHOLHDL 4.2 3.9   Lab Results  Component Value Date   HGBA1C 11.6 (H) 09/22/2017    Procedures since last visit: No results found.  Assessment/Plan   ICD-10-CM   1. Type 2 diabetes mellitus with diabetic  neuropathy, with long-term current use of insulin (HCC) - uncontrolled due to medication noncompliance E11.40 Metformin HCl 500 MG/5ML SOLN   Z79.4   2. Other diabetic neurological complication associated with  type 2 diabetes mellitus (HCC) E11.49 Gabapentin 300 MG/6ML SOLN  3. MCI (mild cognitive impairment) G31.84   4. Hyperlipidemia, unspecified hyperlipidemia type E78.5      STOP METFORMIN AND GABAPENTIN PILLS  START LIQUID METFORMIN AND GABAPENTIN AS ORDERED to improve compliance of meds  PLEASE SCHEDULE APPT WITH CATHEY MILLER FOR DIABETIC EDUCATION/MANAGEMENT  Continue other medications as ordered  Follow up with cardiology as scheduled  Follow up in 3 mos for DM, HTN, CAD, hyperlipidemia. Will assess need of labs at next [facility] S. Perlie Gold  [facility] [address] [place], [place] [address] [phone] Cell (Monday-Friday 8 AM - 5 PM) [phone] After 5 PM and follow prompts

## 2017-09-26 NOTE — Patient Instructions (Signed)
STOP METFORMIN AND GABAPENTIN PILLS  START LIQUID METFORMIN AND GABAPENTIN AS ORDERED  PLEASE SCHEDULE APPT WITH CATHEY MILLER FOR DIABETIC EDUCATION/MANAGEMENT  Continue other medications as ordered  Follow up with cardiology as scheduled  Follow up in 3 mos for DM, HTN, CAD, hyperlipidemia

## 2017-10-09 ENCOUNTER — Ambulatory Visit: Payer: Medicare HMO | Admitting: Pharmacotherapy

## 2017-10-10 ENCOUNTER — Ambulatory Visit: Payer: Medicare HMO

## 2017-10-10 NOTE — Progress Notes (Deleted)
Patient ID: Calvin [name]                 DOB: 1960/08/28                      MRN: [id number]     HPI: Calvin [name] is a 57 y.o. male patient of Dr. Angelena Form, referred by Ellen Henri, PA who presents today for hypertension evaluation. PMH significant for systolic HF/nonischemic CM, HTN, DM2, HLD, and mild cognitive impairment. At his most recent OV, his medications were not titrated as pressure was 120/80 without taking medications.   He presents today for additional medication titration if warranted.    Current HTN meds:  Carvedilol 3.138m BID Lisinopril 575mdaily   Previously tried:   BP goal: <130/80  Family History: mother with MI, HTN, HLD, and heart disease; father with MI, HTN, HLD, heart disease; sister with MI  Social History: denies tobacco products and alcohol  Diet:   Exercise:   Home BP readings:   Wt Readings from Last 3 Encounters:  09/26/17 167 lb (75.8 kg)  09/15/17 166 lb (75.3 kg)  08/25/17 178 lb (80.7 kg)   BP Readings from Last 3 Encounters:  09/26/17 128/70  09/15/17 120/80  08/25/17 (!) 173/94   Pulse Readings from Last 3 Encounters:  09/26/17 80  09/15/17 87  08/25/17 85    Renal function: Estimated Creatinine Clearance: 65.1 mL/min (by C-G formula based on SCr of 1.13 mg/dL).  Past Medical History:  Diagnosis Date  . Adenoidal hypertrophy   . Adenomatous colon polyps 2012  . Arthritis   . Diabetes mellitus   . GERD (gastroesophageal reflux disease)   . Heart attack [place]   [place]  . Marland Kitchenx of adenomatous colonic polyps 08/18/2017  . Hyperlipidemia   . Hypertension   . Obesity   . Sleep apnea    cpap    Current Outpatient Medications on File Prior to Visit  Medication Sig Dispense Refill  . aspirin EC 81 MG tablet Take 81 mg by mouth daily.    . Marland Kitchentorvastatin (LIPITOR) 40 MG tablet Take 1 tablet (40 mg total) by mouth daily. (Patient not taking: Reported on 09/26/2017) 90 tablet 3  . Blood Glucose  Monitoring Suppl (TRUE METRIX AIR GLUCOSE METER) w/Device KIT 1 strip 3 (three) times daily by Does not apply route. Use 1 strip to check blood sugar three (3) times daily. Dx:E11.9 (Patient not taking: Reported on 09/26/2017) 1 kit 0  . carvedilol (COREG) 3.125 MG tablet Take 1 tablet (3.125 mg total) by mouth 2 (two) times daily with a meal. For blood pressure 180 tablet 1  . docusate sodium (COLACE) 100 MG capsule Take 2 capsules (200 mg total) by mouth daily. (Patient not taking: Reported on 09/26/2017) 60 capsule 3  . donepezil (ARICEPT) 10 MG tablet Take 1 tablet (10 mg total) by mouth at bedtime. 30 tablet 3  . Gabapentin 300 MG/6ML SOLN Take 300 mg by mouth 2 (two) times daily. For nerve pain 360 mL 6  . insulin detemir (LEVEMIR) 100 UNIT/ML injection Inject 26 units subcut daily for diabetes 10 mL 3  . Insulin Syringe-Needle U-100 (INSULIN SYRINGE .3CC/29GX1/2") 29G X 1/2" 0.3 ML MISC Inject 1 Syringe 3 (three) times daily as directed. Check blood sugar three times daily. Dx: E11.9 100 each 3  . lisinopril (PRINIVIL,ZESTRIL) 5 MG tablet Take 1 tablet (5 mg total) by mouth daily. For blood pressure and kidney protection 90  tablet 1  . Metformin HCl 500 MG/5ML SOLN Take 10 mLs (1,000 mg total) by mouth 2 (two) times daily. For diabetes 600 mL 6  . nitroGLYCERIN (NITROSTAT) 0.4 MG SL tablet Place 1 tablet (0.4 mg total) under the tongue every 5 (five) minutes as needed for chest pain. 30 tablet 3   Current Facility-Administered Medications on File Prior to Visit  Medication Dose Route Frequency Provider Last Rate Last Dose  . 0.9 %  sodium chloride infusion  500 mL Intravenous Once [name], MD        No Known Allergies  There were no vitals taken for this visit.   Assessment/Plan: Hypertension: increase carvedilol?   Thank you, Lelan Pons. Patterson Hammersmith, [facility]  10/10/2017 7:52 AM

## 2017-10-14 ENCOUNTER — Other Ambulatory Visit: Payer: Self-pay

## 2017-10-14 ENCOUNTER — Emergency Department (HOSPITAL_COMMUNITY): Payer: Medicare HMO

## 2017-10-14 ENCOUNTER — Observation Stay (HOSPITAL_COMMUNITY): Payer: Medicare HMO

## 2017-10-14 ENCOUNTER — Encounter (HOSPITAL_COMMUNITY): Payer: Self-pay

## 2017-10-14 ENCOUNTER — Observation Stay (HOSPITAL_COMMUNITY)
Admission: EM | Admit: 2017-10-14 | Discharge: 2017-10-15 | Disposition: A | Discharge status: Home / Self Care | Payer: Medicare HMO | Attending: Internal Medicine | Admitting: Internal Medicine

## 2017-10-14 DIAGNOSIS — G309 Alzheimer's disease, unspecified: Secondary | ICD-10-CM | POA: Diagnosis present

## 2017-10-14 DIAGNOSIS — E1140 Type 2 diabetes mellitus with diabetic neuropathy, unspecified: Secondary | ICD-10-CM | POA: Diagnosis present

## 2017-10-14 DIAGNOSIS — Z9119 Patient's noncompliance with other medical treatment and regimen: Secondary | ICD-10-CM | POA: Insufficient documentation

## 2017-10-14 DIAGNOSIS — M1990 Unspecified osteoarthritis, unspecified site: Secondary | ICD-10-CM | POA: Diagnosis not present

## 2017-10-14 DIAGNOSIS — W01190A Fall on same level from slipping, tripping and stumbling with subsequent striking against furniture, initial encounter: Secondary | ICD-10-CM | POA: Diagnosis not present

## 2017-10-14 DIAGNOSIS — R0602 Shortness of breath: Secondary | ICD-10-CM | POA: Diagnosis not present

## 2017-10-14 DIAGNOSIS — I2510 Atherosclerotic heart disease of native coronary artery without angina pectoris: Secondary | ICD-10-CM | POA: Diagnosis not present

## 2017-10-14 DIAGNOSIS — E1165 Type 2 diabetes mellitus with hyperglycemia: Secondary | ICD-10-CM | POA: Insufficient documentation

## 2017-10-14 DIAGNOSIS — M25512 Pain in left shoulder: Secondary | ICD-10-CM | POA: Insufficient documentation

## 2017-10-14 DIAGNOSIS — I10 Essential (primary) hypertension: Secondary | ICD-10-CM | POA: Diagnosis present

## 2017-10-14 DIAGNOSIS — Z9989 Dependence on other enabling machines and devices: Secondary | ICD-10-CM | POA: Insufficient documentation

## 2017-10-14 DIAGNOSIS — K219 Gastro-esophageal reflux disease without esophagitis: Secondary | ICD-10-CM | POA: Diagnosis not present

## 2017-10-14 DIAGNOSIS — E785 Hyperlipidemia, unspecified: Secondary | ICD-10-CM | POA: Diagnosis present

## 2017-10-14 DIAGNOSIS — E119 Type 2 diabetes mellitus without complications: Secondary | ICD-10-CM

## 2017-10-14 DIAGNOSIS — Z79899 Other long term (current) drug therapy: Secondary | ICD-10-CM | POA: Insufficient documentation

## 2017-10-14 DIAGNOSIS — E1149 Type 2 diabetes mellitus with other diabetic neurological complication: Secondary | ICD-10-CM | POA: Diagnosis not present

## 2017-10-14 DIAGNOSIS — I5022 Chronic systolic (congestive) heart failure: Secondary | ICD-10-CM | POA: Diagnosis not present

## 2017-10-14 DIAGNOSIS — S0992XA Unspecified injury of nose, initial encounter: Secondary | ICD-10-CM | POA: Diagnosis not present

## 2017-10-14 DIAGNOSIS — Z8249 Family history of ischemic heart disease and other diseases of the circulatory system: Secondary | ICD-10-CM | POA: Insufficient documentation

## 2017-10-14 DIAGNOSIS — R0789 Other chest pain: Secondary | ICD-10-CM | POA: Diagnosis not present

## 2017-10-14 DIAGNOSIS — R531 Weakness: Secondary | ICD-10-CM

## 2017-10-14 DIAGNOSIS — I429 Cardiomyopathy, unspecified: Secondary | ICD-10-CM | POA: Diagnosis not present

## 2017-10-14 DIAGNOSIS — F0390 Unspecified dementia without behavioral disturbance: Secondary | ICD-10-CM | POA: Insufficient documentation

## 2017-10-14 DIAGNOSIS — M79602 Pain in left arm: Secondary | ICD-10-CM | POA: Diagnosis not present

## 2017-10-14 DIAGNOSIS — Z794 Long term (current) use of insulin: Secondary | ICD-10-CM | POA: Insufficient documentation

## 2017-10-14 DIAGNOSIS — Z86010 Personal history of colonic polyps: Secondary | ICD-10-CM | POA: Diagnosis not present

## 2017-10-14 DIAGNOSIS — Z7982 Long term (current) use of aspirin: Secondary | ICD-10-CM | POA: Insufficient documentation

## 2017-10-14 DIAGNOSIS — E1142 Type 2 diabetes mellitus with diabetic polyneuropathy: Secondary | ICD-10-CM | POA: Diagnosis not present

## 2017-10-14 DIAGNOSIS — G3184 Mild cognitive impairment, so stated: Secondary | ICD-10-CM | POA: Diagnosis not present

## 2017-10-14 DIAGNOSIS — G4733 Obstructive sleep apnea (adult) (pediatric): Secondary | ICD-10-CM | POA: Diagnosis not present

## 2017-10-14 DIAGNOSIS — I110 Hypertensive heart disease with heart failure: Secondary | ICD-10-CM | POA: Diagnosis not present

## 2017-10-14 DIAGNOSIS — R51 Headache: Secondary | ICD-10-CM | POA: Diagnosis not present

## 2017-10-14 DIAGNOSIS — N400 Enlarged prostate without lower urinary tract symptoms: Secondary | ICD-10-CM | POA: Insufficient documentation

## 2017-10-14 DIAGNOSIS — R079 Chest pain, unspecified: Secondary | ICD-10-CM | POA: Diagnosis not present

## 2017-10-14 DIAGNOSIS — Y9302 Activity, running: Secondary | ICD-10-CM | POA: Insufficient documentation

## 2017-10-14 DIAGNOSIS — I252 Old myocardial infarction: Secondary | ICD-10-CM | POA: Diagnosis not present

## 2017-10-14 DIAGNOSIS — E1159 Type 2 diabetes mellitus with other circulatory complications: Secondary | ICD-10-CM

## 2017-10-14 DIAGNOSIS — I6523 Occlusion and stenosis of bilateral carotid arteries: Secondary | ICD-10-CM | POA: Insufficient documentation

## 2017-10-14 DIAGNOSIS — F067 Alzheimer's disease, unspecified: Secondary | ICD-10-CM | POA: Diagnosis present

## 2017-10-14 HISTORY — DX: Other cardiomyopathies: I42.8

## 2017-10-14 LAB — CBC
HCT: 47.4 % (ref 39.0–52.0)
Hemoglobin: 16.7 g/dL (ref 13.0–17.0)
MCH: 31.3 pg (ref 26.0–34.0)
MCHC: 35.2 g/dL (ref 30.0–36.0)
MCV: 88.8 fL (ref 78.0–100.0)
Platelets: 214 10*3/uL (ref 150–400)
RBC: 5.34 MIL/uL (ref 4.22–5.81)
RDW: 11.9 % (ref 11.5–15.5)
WBC: 5.9 10*3/uL (ref 4.0–10.5)

## 2017-10-14 LAB — RAPID URINE DRUG SCREEN, HOSP PERFORMED
Amphetamines: NOT DETECTED
Barbiturates: NOT DETECTED
Benzodiazepines: NOT DETECTED
Cocaine: NOT DETECTED
Opiates: NOT DETECTED
Tetrahydrocannabinol: NOT DETECTED

## 2017-10-14 LAB — BASIC METABOLIC PANEL
Anion gap: 9 (ref 5–15)
BUN: 8 mg/dL (ref 6–20)
CO2: 25 mmol/L (ref 22–32)
Calcium: 9.3 mg/dL (ref 8.9–10.3)
Chloride: 102 mmol/L (ref 101–111)
Creatinine, Ser: 1.12 mg/dL (ref 0.61–1.24)
GFR calc Af Amer: >60 mL/min (ref >60)
GFR calc non Af Amer: >60 mL/min (ref >60)
Glucose, Bld: 288 mg/dL — ABNORMAL HIGH (ref 65–99)
Potassium: 4.1 mmol/L (ref 3.5–5.1)
Sodium: 136 mmol/L (ref 135–145)

## 2017-10-14 LAB — TROPONIN I: Troponin I: <0.03 ng/mL (ref <0.03)

## 2017-10-14 LAB — I-STAT TROPONIN, ED: Troponin i, poc: 0 ng/mL (ref 0.00–0.08)

## 2017-10-14 LAB — BRAIN NATRIURETIC PEPTIDE: B Natriuretic Peptide: 130.9 pg/mL — ABNORMAL HIGH (ref 0.0–100.0)

## 2017-10-14 MED ORDER — IOPAMIDOL (ISOVUE-370) INJECTION 76%
100.0000 mL | Freq: Once | INTRAVENOUS | Status: AC | PRN
  Administered 2017-10-14: 100 mL via INTRAVENOUS

## 2017-10-14 MED ORDER — IOPAMIDOL (ISOVUE-370) INJECTION 76%
INTRAVENOUS | Status: AC
  Filled 2017-10-14: qty 100

## 2017-10-14 MED ORDER — SODIUM CHLORIDE 0.9 % IV BOLUS
1000.0000 mL | Freq: Once | INTRAVENOUS | Status: AC
  Administered 2017-10-14: 1000 mL via INTRAVENOUS

## 2017-10-14 MED ORDER — HYDROMORPHONE HCL 2 MG/ML IJ SOLN
1.0000 mg | Freq: Once | INTRAMUSCULAR | Status: AC
  Administered 2017-10-14: 1 mg via INTRAVENOUS
  Filled 2017-10-14: qty 1

## 2017-10-14 MED ORDER — PROCHLORPERAZINE EDISYLATE 5 MG/ML IJ SOLN
10.0000 mg | Freq: Once | INTRAMUSCULAR | Status: AC
  Administered 2017-10-14: 10 mg via INTRAVENOUS
  Filled 2017-10-14: qty 2

## 2017-10-14 MED ORDER — FENTANYL CITRATE (PF) 100 MCG/2ML IJ SOLN
50.0000 ug | Freq: Once | INTRAMUSCULAR | Status: AC
  Administered 2017-10-14: 50 ug via INTRAVENOUS
  Filled 2017-10-14: qty 2

## 2017-10-14 MED ORDER — SODIUM CHLORIDE 0.9 % IV SOLN
INTRAVENOUS | Status: DC
  Administered 2017-10-14: 22:00:00 via INTRAVENOUS

## 2017-10-14 NOTE — ED Notes (Signed)
Patient transported to CT 

## 2017-10-14 NOTE — ED Triage Notes (Signed)
Pt endorses left sided chest pain with radiation to the left arm, dizziness and shob. Has hx of MI and has DM.

## 2017-10-14 NOTE — ED Provider Notes (Signed)
[facility] EMERGENCY DEPARTMENT Provider Note   CSN: [id number] Arrival date & time: 10/14/17  1431     History   Chief Complaint Chief Complaint  Patient presents with  . Chest Pain  . Headache    HPI Calvin [name] is a 57 y.o. male with a history of DM, hyperlipidemia, hypertension, sleep apnea, and CAD who presents to the emergency department today with complaint of chest pain that started yesterday afternoon.  Patient describes the pain as being in his left chest radiating down the left arm.  States the pain is sharp.  States he has a numbness sensation to the left upper extremity.  Had some nausea with 2 episodes of nonbloody emesis.  States that the symptoms have been constant, no alleviating or aggravating factors.  Rates pain a 10 out of 10 in severity.  Has not tried intervention prior to arrival.  Patient states this is similar to previous problems he has had with his heart.  Patient with additional complaint of gradual onset steady progression headache after hitting his head yesterday.  States that he bumped the bridge of his nose region on a table.  No loss of consciousness.  States the headache gradually progressed after this incident.  States he felt a little lightheaded/off balance.  Denies change in vision, weakness, change in speech, or dyspnea.   HPI  Past Medical History:  Diagnosis Date  . Adenoidal hypertrophy   . Adenomatous colon polyps 2012  . Arthritis   . Diabetes mellitus   . GERD (gastroesophageal reflux disease)   . Heart attack ([place])    While living in [place]  [name] Hx of adenomatous colonic polyps 08/18/2017  . Hyperlipidemia   . Hypertension   . Obesity   . Sleep apnea    cpap    Patient Active Problem List   Diagnosis Date Noted  . Diabetic neuropathy ([place]) 09/26/2017  . MCI (mild cognitive impairment) 06/23/2017  . OSA on CPAP 05/10/2017  . Gastroesophageal reflux disease 05/10/2017  . Insomnia 05/10/2017  . Onychomycosis  of multiple toenails with type 2 diabetes mellitus and peripheral neuropathy ([place]) 05/10/2017  . Chronic systolic heart failure ([place]) 11/22/2015  . Essential hypertension 11/22/2015  . Chest pain 11/20/2015  . DM (diabetes mellitus) ([place]) 11/20/2015  . HLD (hyperlipidemia) 11/20/2015  . Hx of adenomatous colonic polyps 05/19/2011    Past Surgical History:  Procedure Laterality Date  . COLONOSCOPY  05/13/11   9 adenomas  . FOREARM SURGERY    . MUSCLE BIOPSY    . RIGHT/LEFT HEART CATH AND CORONARY ANGIOGRAPHY N/A 08/25/2017   Procedure: RIGHT/LEFT HEART CATH AND CORONARY ANGIOGRAPHY;  Surgeon: [name], MD;  Location: [facility];  Service: Cardiovascular;  Laterality: N/A;        Home Medications    Prior to Admission medications   Medication Sig Start Date End Date Taking? Authorizing Provider  aspirin EC 81 MG tablet Take 81 mg by mouth daily.    [provider]  atorvastatin (LIPITOR) 40 MG tablet Take 1 tablet (40 mg total) by mouth daily. Patient not taking: Reported on 09/26/2017 09/15/17 12/14/17  Lyda Jester M, [name]  Blood Glucose Monitoring Suppl (TRUE METRIX AIR GLUCOSE METER) w/Device KIT 1 strip 3 (three) times daily by Does not apply route. Use 1 strip to check blood sugar three (3) times daily. Dx:E11.9 Patient not taking: Reported on 09/26/2017 05/10/17   [name], DO  carvedilol (COREG) 3.125 MG tablet Take  1 tablet (3.125 mg total) by mouth 2 (two) times daily with a meal. For blood pressure 07/28/17   [name], DO  docusate sodium (COLACE) 100 MG capsule Take 2 capsules (200 mg total) by mouth daily. Patient not taking: Reported on 09/26/2017 07/25/17   [name], DO  donepezil (ARICEPT) 10 MG tablet Take 1 tablet (10 mg total) by mouth at bedtime. 07/25/17   [name], DO  Gabapentin 300 MG/6ML SOLN Take 300 mg by mouth 2 (two) times daily. For nerve pain 09/26/17   [name], DO  insulin detemir (LEVEMIR) 100  UNIT/ML injection Inject 26 units subcut daily for diabetes 07/25/17   [name], DO  Insulin Syringe-Needle U-100 (INSULIN SYRINGE .3CC/29GX1/2") 29G X 1/2" 0.3 ML MISC Inject 1 Syringe 3 (three) times daily as directed. Check blood sugar three times daily. Dx: E11.9 05/10/17   [name], DO  lisinopril (PRINIVIL,ZESTRIL) 5 MG tablet Take 1 tablet (5 mg total) by mouth daily. For blood pressure and kidney protection 07/28/17   [name], DO  Metformin HCl 500 MG/5ML SOLN Take 10 mLs (1,000 mg total) by mouth 2 (two) times daily. For diabetes 09/26/17   [name], DO  nitroGLYCERIN (NITROSTAT) 0.4 MG SL tablet Place 1 tablet (0.4 mg total) under the tongue every 5 (five) minutes as needed for chest pain. 07/28/17   [name], DO    Family History Family History  Problem Relation Age of Onset  . Heart disease Mother   . Heart attack Mother 81  . Hypertension Mother   . Hyperlipidemia Mother   . Heart disease Father   . Heart attack Father 66  . Hypertension Father   . Hyperlipidemia Father   . Heart attack Sister 20  . Colon cancer Neg Hx   . Stomach cancer Neg Hx   . Esophageal cancer Neg Hx   . Rectal cancer Neg Hx     Social History Social History   Tobacco Use  . Smoking status: Never Smoker  . Smokeless tobacco: Never Used  Substance Use Topics  . Alcohol use: No  . Drug use: No     Allergies   Patient has no known allergies.   Review of Systems Review of Systems  Constitutional: Negative for chills and fever.  Respiratory: Negative for shortness of breath.   Cardiovascular: Positive for chest pain. Negative for palpitations and leg swelling.  Gastrointestinal: Positive for nausea and vomiting. Negative for abdominal pain, constipation and diarrhea.  Musculoskeletal: Negative for neck stiffness.  Neurological: Positive for light-headedness (and off balance), numbness (LUE) and headaches. Negative for dizziness, syncope, speech difficulty and  weakness.  All other systems reviewed and are negative.    Physical Exam Updated Vital Signs BP 133/80 (BP Location: Right Arm)   Pulse 86   Temp 98.1 F (36.7 C) (Oral)   Resp (!) 25   Ht 5' 6"  (1.676 m)   Wt 75.8 kg (167 lb)   SpO2 98%   BMI 26.95 kg/m   Physical Exam  Constitutional: He appears well-developed and well-nourished.  Non-toxic appearance. No distress.  HENT:  Head: Normocephalic.    Mouth/Throat: Uvula is midline and oropharynx is clear and moist.  Eyes: Pupils are equal, round, and reactive to light. Conjunctivae and EOM are normal. Right eye exhibits no discharge. Left eye exhibits no discharge.  Neck: Normal range of motion. Neck supple. No neck rigidity.  Cardiovascular: Normal rate and regular rhythm.  No murmur heard. Pulses:  Radial pulses are 2+ on the right side, and 2+ on the left side.  Pulmonary/Chest: Breath sounds normal. No respiratory distress. He has no wheezes. He has no rhonchi. He has no rales. He exhibits no tenderness.  Abdominal: Soft. He exhibits no distension. There is no tenderness.  Neurological: He is alert.  Clear speech.  No facial droop.  CN III through XII grossly intact.  Patient has 5 out of 5 symmetric grip strength.  Patient has 5 out of 5 strength with plantar and dorsiflexion bilaterally.  Patient sensation grossly intact to right upper extremity and bilateral lower extremities.  Patient has decreased sensation to the entire left upper extremity, attempted sharp/dull discrimination which he is unable to perform. Normal finger to nose bilaterally. Negative pronator drift. Patient a bit unsteady when attempting to ambulate.   Skin: Skin is warm and dry. No rash noted.  Psychiatric: He has a normal mood and affect. His behavior is normal.  Nursing note and vitals reviewed.    ED Treatments / Results  Labs Results for orders placed or performed during the hospital encounter of 34/19/37  Basic metabolic panel  Result  Value Ref Range   Sodium 136 135 - 145 mmol/L   Potassium 4.1 3.5 - 5.1 mmol/L   Chloride 102 101 - 111 mmol/L   CO2 25 22 - 32 mmol/L   Glucose, Bld 288 (H) 65 - 99 mg/dL   BUN 8 6 - 20 mg/dL   Creatinine, Ser 1.12 0.61 - 1.24 mg/dL   Calcium 9.3 8.9 - 10.3 mg/dL   GFR calc non Af Amer >60 >60 mL/min   GFR calc Af Amer >60 >60 mL/min   Anion gap 9 5 - 15  CBC  Result Value Ref Range   WBC 5.9 4.0 - 10.5 K/uL   RBC 5.34 4.22 - 5.81 MIL/uL   Hemoglobin 16.7 13.0 - 17.0 g/dL   HCT 47.4 39.0 - 52.0 %   MCV 88.8 78.0 - 100.0 fL   MCH 31.3 26.0 - 34.0 pg   MCHC 35.2 30.0 - 36.0 g/dL   RDW 11.9 11.5 - 15.5 %   Platelets 214 150 - 400 K/uL  I-stat troponin, ED  Result Value Ref Range   Troponin i, poc 0.00 0.00 - 0.08 ng/mL   Comment 3           EKG EKG Interpretation  Date/Time:  Saturday October 14 2017 14:38:20 EDT Ventricular Rate:  90 PR Interval:  174 QRS Duration: 88 QT Interval:  352 QTC Calculation: 430 R Axis:   30 Text Interpretation:  Normal sinus rhythm ST & T wave abnormality, consider inferolateral ischemia Abnormal ECG Since last EKG, ST depressions more evident in inferolateral leads Confirmed by Duffy Bruce [phone]) on 10/14/2017 5:44:20 PM   Radiology Dg Chest 2 View  Result Date: 10/14/2017 CLINICAL DATA:  Left-sided chest pain with radiation to left arm. Shortness of breath. Dizziness. Previous myocardial infarct. EXAM: CHEST - 2 VIEW COMPARISON:  03/18/2017 FINDINGS: The heart size and mediastinal contours are within normal limits. Stable prominence of epicardial fat pad in the right cardiophrenic angle. Both lungs are clear. The visualized skeletal structures are unremarkable. IMPRESSION: Stable exam.  No active cardiopulmonary disease. Electronically Signed   By: Earle Gell M.D.   On: 10/14/2017 15:15   Procedures Procedures (including critical care time)  Medications Ordered in ED Medications - No data to display   Initial Impression /  Assessment and Plan / ED Course  I have reviewed the triage vital signs and the nursing notes.  Pertinent labs & imaging results that were available during my care of the patient were reviewed by me and considered in my medical decision making (see chart for details).   Prior Results Reviewed:  08/25/2017: Right/Left Heart Cath & Coronary angiography Conclusion    Prox RCA to Mid RCA lesion is 30% stenosed.  Mid RCA to Dist RCA lesion is 30% stenosed.  Prox Cx to Dist Cx lesion is 20% stenosed.  Mid LAD lesion is 30% stenosed.  There is mild to moderate left ventricular systolic dysfunction.  LV end diastolic pressure is mildly elevated.  The left ventricular ejection fraction is 35-45% by visual estimate.  There is no mitral valve regurgitation.   1. Mild non-obstructive CAD 2. Moderate LV systolic dysfunction. Non-ischemic cardiomyopathy 3. Normal filling pressures.   Recommendations: Medical management of CAD and cardiomyopathy.   09/04/2017: ECHO Study Conclusions - Left ventricle: The cavity size was normal. Wall thickness was   increased in a pattern of mild LVH. Systolic function was   severely reduced. The estimated ejection fraction was in the   range of 20% to 25%. Diffuse hypokinesis. Doppler parameters are   consistent with abnormal left ventricular relaxation (grade 1   diastolic dysfunction). - Aortic valve: There was trivial regurgitation. Impressions: - Severe global reduction in LV systolic function; mild LVH; mild   diastolic dysfunction; trace AI, MR and TR.   Patient presents with complaints of chest pain and headache after mild head trauma.  Patient is nontoxic-appearing, in no apparent distress, initial vitals WNL.  Patient's labs are grossly unremarkable with the exception of hyperglycemia at 288 this appears consistent with previous blood work, of note no leukocytosis, no anemia, no significant electrolyte abnormalities.  Initial troponin within  normal limits.  Chest x-ray without significant abnormalities. ST depressions more evident in inferolateral leads on EKG. Given patient's chest pain with left upper extremity sensory deficit there is concern for dissection, CT order with dissection protocol.  Will additionally evaluate with CT head given headache following minor trauma.   CT for dissection negative.  CT head negative for acute intracranial abnormality.  20:45: RE-EVAL: Patient chest pain and LUE numbness resolved, still having headache. Patient sensation intact to bilateral upper extremities on exam. Discussed results and plan for admission for observation.   Headache-suspect mild post minor trauma headache, no evidence of bleed on CT scan. Patient presents with complaint of headache. Migraine cocktail ordered.  Chest pain- Given patient with CAD and high risk, will plan for admission for chest pain observation.  21:33: CONSULT: Discussed case with hospitalist Dr. Roel Cluck who accepts admission.   Findings and plan of care discussed with supervising physician Dr. Ellender Hose who has evaluated patient and is in agreement.    Final Clinical Impressions(s) / ED Diagnoses   Final diagnoses:  Chest pain, unspecified type    ED Discharge Orders    None       Amaryllis Dyke, [name] 10/15/17 0101    Duffy Bruce, MD 10/15/17 1112

## 2017-10-14 NOTE — H&P (Signed)
Calvin [name] OXB:[id number] DOB: [birth year] DOA: 10/14/2017     PCP: [name], DO   Outpatient Specialists:   CARDS:  Dr.McAlhany   Patient arrived to ER on 10/14/17 at 1431  Patient coming from:    home Lives alone,      Chief Complaint:  Chief Complaint  Patient presents with  . Chest Pain  . Headache    HPI: Calvin [name] is a 57 y.o. male with medical history significant of CAD, DM, HTN Hyperlipidemia, and Neuropathy, chronic systolic CHF, sleep apnea, dementia    Presented with left-sided chest pain with radiation to left arm associated shortness of breath and lightheadedness and discomfort started yesterday afternoon pain feels like sharp radiates to left arm nausea and has had 2 episodes of vomiting nothing seems to make it better or worse.  Initially pain was severe 10 out of 10 when he presented to emergency department Also reports headache since he hit his nose on a table yesterday no LOC. He was playing with the kids and running yesterday and tripped. He states while he was playing with the kids he was having chest pain too but ignored it.  At some point patient did state he felt off balance.  No localized weakness slurred speech  Reports numbness over all left side of his body. Reports he have had episodes that last few days long. This episode started 5 days ago.   Regarding pertinent Chronic problems: History of CAD last cardiac catheterization February 2019 mild nonobstructive CAD EF 35-45% in March 2D echo showed EF 20-25% patient on Coreg and lisinopril and aspirin followed by cardiology History of diabetes on Levemir last hemoglobin A1c 11.6 While in ER:   Following Medications were ordered in ER: Medications  iopamidol (ISOVUE-370) 76 % injection (has no administration in time range)  sodium chloride 0.9 % bolus 1,000 mL (1,000 mLs Intravenous New Bag/Given 10/14/17 2116)    And  0.9 %  sodium chloride infusion (has no administration in time  range)  fentaNYL (SUBLIMAZE) injection 50 mcg (50 mcg Intravenous Given 10/14/17 1903)  iopamidol (ISOVUE-370) 76 % injection 100 mL (100 mLs Intravenous Contrast Given 10/14/17 1951)  prochlorperazine (COMPAZINE) injection 10 mg (10 mg Intravenous Given 10/14/17 2115)  HYDROmorphone (DILAUDID) injection 1 mg (1 mg Intravenous Given 10/14/17 2115)    Significant initial  Findings: Abnormal Labs Reviewed  BASIC METABOLIC PANEL - Abnormal; Notable for the following components:      Result Value   Glucose, Bld 288 (*)    All other components within normal limits     Na 136 K 4.1  Cr   stable,    Lab Results  Component Value Date   CREATININE 1.12 10/14/2017   CREATININE 1.13 09/22/2017   CREATININE 1.24 08/16/2017      WBC 5.9  HG/HCT  stable,       Component Value Date/Time   HGB 16.7 10/14/2017 1444   HGB 15.6 08/16/2017 1037   HCT 47.4 10/14/2017 1444   HCT 44.7 08/16/2017 1037       Troponin (Point of Care Test) Recent Labs    10/14/17 1503  TROPIPOC 0.00    BNP (last 3 results) No results for input(s): BNP in the last 8760 hours.  ProBNP (last 3 results) No results for input(s): PROBNP in the last 8760 hours.     UA  not ordered   CT HEAD    NON acute  CXR - NON acute  CT A chest -  Non acute, no dissection  ECG:  Personally reviewed by me showing: HR : 90 Rhythm: NSR,   Ischemic changes ST segment depression in lateral leads QTC 430     ED Triage Vitals  Enc Vitals Group     BP 10/14/17 1446 132/84     Pulse Rate 10/14/17 1446 85     Resp 10/14/17 1446 20     Temp 10/14/17 1446 98.1 F (36.7 C)     Temp Source 10/14/17 1446 Oral     SpO2 10/14/17 1446 100 %     Weight 10/14/17 1450 167 lb (75.8 kg)     Height 10/14/17 1450 5' 6"  (1.676 m)     Head Circumference --      Peak Flow --      Pain Score 10/14/17 1450 10     Pain Loc --      Pain Edu? --      Excl. in [place]? --   TMAX(24)@       Latest  Blood pressure (!) 152/81, pulse  76, temperature 98.1 F (36.7 C), temperature source Oral, resp. rate 16, height 5' 6"  (1.676 m), weight 75.8 kg (167 lb), SpO2 99 %.    Hospitalist was called for admission for chest pain evaluation   Review of Systems:    Pertinent positives include:  shortness of breath at rest, chest pain,tingling  Weakness, left arm  Constitutional:  No weight loss, night sweats, Fevers, chills, fatigue, weight loss  HEENT:  No headaches, Difficulty swallowing,Tooth/dental problems,Sore throat,  No sneezing, itching, ear ache, nasal congestion, post nasal drip,  Cardio-vascular:  No  Orthopnea, PND, anasarca, dizziness, palpitations.no Bilateral lower extremity swelling  GI:  No heartburn, indigestion, abdominal pain, nausea, vomiting, diarrhea, change in bowel habits, loss of appetite, melena, blood in stool, hematemesis Resp:  no. No dyspnea on exertion, No excess mucus, no productive cough, No non-productive cough, No coughing up of blood.No change in color of mucus.No wheezing. Skin:  no rash or lesions. No jaundice GU:  no dysuria, change in color of urine, no urgency or frequency. No straining to urinate.  No flank pain.  Musculoskeletal:  No joint pain or no joint swelling. No decreased range of motion. No back pain.  Psych:  No change in mood or affect. No depression or anxiety. No memory loss.  Neuro: no localizing neurological complaints, no  no double vision, no gait abnormality, no slurred speech, no confusion  As per HPI otherwise 10 point review of systems negative.   Past Medical History:   Past Medical History:  Diagnosis Date  . Adenoidal hypertrophy   . Adenomatous colon polyps 2012  . Arthritis   . Diabetes mellitus   . GERD (gastroesophageal reflux disease)   . Heart attack ([place])    While living in [place]  [name] Hx of adenomatous colonic polyps 08/18/2017  . Hyperlipidemia   . Hypertension   . Obesity   . Sleep apnea    cpap      Past Surgical History:    Procedure Laterality Date  . COLONOSCOPY  05/13/11   9 adenomas  . FOREARM SURGERY    . MUSCLE BIOPSY    . RIGHT/LEFT HEART CATH AND CORONARY ANGIOGRAPHY N/A 08/25/2017   Procedure: RIGHT/LEFT HEART CATH AND CORONARY ANGIOGRAPHY;  Surgeon: [name], MD;  Location: [facility];  Service: Cardiovascular;  Laterality: N/A;    Social History:  Ambulatory  independently       reports  that he has never smoked. He has never used smokeless tobacco. He reports that he does not drink alcohol or use drugs.     Family History:   Family History  Problem Relation Age of Onset  . Heart disease Mother   . Heart attack Mother 63  . Hypertension Mother   . Hyperlipidemia Mother   . Heart disease Father   . Heart attack Father 77  . Hypertension Father   . Hyperlipidemia Father   . Heart attack Sister 53  . Colon cancer Neg Hx   . Stomach cancer Neg Hx   . Esophageal cancer Neg Hx   . Rectal cancer Neg Hx     Allergies: No Known Allergies   Prior to Admission medications   Medication Sig Start Date End Date Taking? Authorizing Provider  aspirin EC 81 MG tablet Take 81 mg by mouth daily.   Yes [provider]  atorvastatin (LIPITOR) 40 MG tablet Take 1 tablet (40 mg total) by mouth daily. 09/15/17 12/14/17 Yes Simmons, Brittainy M, [name]  carvedilol (COREG) 3.125 MG tablet Take 1 tablet (3.125 mg total) by mouth 2 (two) times daily with a meal. For blood pressure 07/28/17  Yes [name], DO  docusate sodium (COLACE) 100 MG capsule Take 2 capsules (200 mg total) by mouth daily. 07/25/17  Yes [name], [name], DO  insulin detemir (LEVEMIR) 100 UNIT/ML injection Inject 26 units subcut daily for diabetes 07/25/17  Yes [name], [name], DO  Blood Glucose Monitoring Suppl (TRUE METRIX AIR GLUCOSE METER) w/Device KIT 1 strip 3 (three) times daily by Does not apply route. Use 1 strip to check blood sugar three (3) times daily. Dx:E11.9 Patient not taking: Reported on  09/26/2017 05/10/17   [name], DO  donepezil (ARICEPT) 10 MG tablet Take 1 tablet (10 mg total) by mouth at bedtime. 07/25/17   [name], DO  Gabapentin 300 MG/6ML SOLN Take 300 mg by mouth 2 (two) times daily. For nerve pain 09/26/17   [name], DO  Insulin Syringe-Needle U-100 (INSULIN SYRINGE .3CC/29GX1/2") 29G X 1/2" 0.3 ML MISC Inject 1 Syringe 3 (three) times daily as directed. Check blood sugar three times daily. Dx: E11.9 05/10/17   [name], DO  lisinopril (PRINIVIL,ZESTRIL) 5 MG tablet Take 1 tablet (5 mg total) by mouth daily. For blood pressure and kidney protection 07/28/17   [name], DO  Metformin HCl 500 MG/5ML SOLN Take 10 mLs (1,000 mg total) by mouth 2 (two) times daily. For diabetes 09/26/17   [name], DO  nitroGLYCERIN (NITROSTAT) 0.4 MG SL tablet Place 1 tablet (0.4 mg total) under the tongue every 5 (five) minutes as needed for chest pain. 07/28/17   [name], DO   Physical Exam: Blood pressure (!) 152/81, pulse 76, temperature 98.1 F (36.7 C), temperature source Oral, resp. rate 16, height 5' 6"  (1.676 m), weight 75.8 kg (167 lb), SpO2 99 %. 1. General:  in No Acute distress  Chronically ill  -appearing 2. Psychological: Alert and Oriented 3. Head/ENT:    Dry Mucous Membranes                          Head Non traumatic, neck supple                          Poor Dentition 4. SKIN: normal  Skin turgor,  Skin clean Dry and intact no rash 5. Heart: Regular rate and  rhythm no  Murmur, no Rub or gallop 6. Lungs:   no wheezes or crackles   7. Abdomen: Soft,  non-tender, Non distended bowel sounds present 8. Lower extremities: no clubbing, cyanosis, or edema 9. Neurologically   strength 5 out of 5 in upper extremities, diminished on the left lower ext.  cranial nerves II through XII intact 10. MSK: Normal range of motion   LABS:     Recent Labs  Lab 10/14/17 1444  WBC 5.9  HGB 16.7  HCT 47.4  MCV 88.8  PLT 662   Basic  Metabolic Panel: Recent Labs  Lab 10/14/17 1444  NA 136  K 4.1  CL 102  CO2 25  GLUCOSE 288*  BUN 8  CREATININE 1.12  CALCIUM 9.3      No results for input(s): AST, ALT, ALKPHOS, BILITOT, PROT, ALBUMIN in the last 168 hours. No results for input(s): LIPASE, AMYLASE in the last 168 hours. No results for input(s): AMMONIA in the last 168 hours.    HbA1C: No results for input(s): HGBA1C in the last 72 hours. CBG: No results for input(s): GLUCAP in the last 168 hours.       Cultures:    Component Value Date/Time   SDES URINE, CLEAN CATCH 09/28/2012 1422   [place] [id number] 09/28/2012 1422   CULT  09/28/2012 1422    Multiple bacterial morphotypes present, none predominant. Suggest appropriate recollection if clinically indicated.   REPTSTATUS 09/30/2012 FINAL 09/28/2012 1422     Radiological Exams on Admission: Dg Chest 2 View  Result Date: 10/14/2017 CLINICAL DATA:  Left-sided chest pain with radiation to left arm. Shortness of breath. Dizziness. Previous myocardial infarct. EXAM: CHEST - 2 VIEW COMPARISON:  03/18/2017 FINDINGS: The heart size and mediastinal contours are within normal limits. Stable prominence of epicardial fat pad in the right cardiophrenic angle. Both lungs are clear. The visualized skeletal structures are unremarkable. IMPRESSION: Stable exam.  No active cardiopulmonary disease. Electronically Signed   By: Earle Gell M.D.   On: 10/14/2017 15:15   Ct Head Wo Contrast  Result Date: 10/14/2017 CLINICAL DATA:  Headache and dizziness. EXAM: CT HEAD WITHOUT CONTRAST TECHNIQUE: Contiguous axial images were obtained from the base of the skull through the vertex without intravenous contrast. COMPARISON:  Head CT February 12, 2010 FINDINGS: Brain: The ventricles are normal in size and configuration. There is no intracranial mass, hemorrhage, extra-axial fluid collection, or midline shift. The gray-white compartments appear normal. No evident acute  infarct. Vascular: No hyperdense vessel. There is mild calcification in each carotid siphon. Skull: The bony calvarium appears intact. Sinuses/Orbits: There is mucosal thickening in several ethmoid air cells. There is a retention cyst in the superior posterior right maxillary antrum. Other visualized paranasal sinuses are clear. Orbits appear symmetric bilaterally. Other: Mastoids on the left are clear. Mastoids on the right are nearly aplastic. IMPRESSION: No intracranial mass or hemorrhage. Gray-white compartments appear normal. There are foci of arterial vascular calcification. There are foci of paranasal sinus disease. Mastoids on the right are nearly aplastic. Electronically Signed   By: Lowella Grip III M.D.   On: 10/14/2017 20:23   Ct Angio Chest/abd/pel For Dissection W And/or W/wo  Result Date: 10/14/2017 CLINICAL DATA:  Left-sided chest pain radiating to the left arm with dizziness and dyspnea. History of myocardial infarction and diabetes. EXAM: CT ANGIOGRAPHY CHEST, ABDOMEN AND PELVIS TECHNIQUE: Multidetector CT imaging through the chest, abdomen and pelvis was performed using the standard protocol during bolus administration of  intravenous contrast. Multiplanar reconstructed images and MIPs were obtained and reviewed to evaluate the vascular anatomy. CONTRAST:  124m ISOVUE-370 IOPAMIDOL (ISOVUE-370) INJECTION 76% COMPARISON:  CXR 10/14/2017, CT chest, abdomen and pelvis from 12/20/2011 FINDINGS: CTA CHEST FINDINGS Cardiovascular: Normal branch pattern of the great vessels without stenosis, dissection or aneurysm. Mild ectasia of the thoracic aorta without aneurysm or dissection. Minimal aortic atherosclerosis at the arch and descending aorta. Borderline stable cardiomegaly without pericardial effusion. No acute pulmonary embolus. Minimal coronary arteriosclerosis suggested along the RCA. Mediastinum/Nodes: No enlarged mediastinal, hilar, or axillary lymph nodes. Thyroid gland, trachea, and  esophagus demonstrate no significant findings. Lungs/Pleura: Lungs are clear given motion degraded artifacts. No pleural effusion or pneumothorax. Musculoskeletal: No chest wall abnormality. No acute or significant osseous findings. Review of the MIP images confirms the above findings. CTA ABDOMEN AND PELVIS FINDINGS VASCULAR Aorta: Normal caliber aorta without aneurysm, dissection, vasculitis or significant stenosis. Celiac: Patent without evidence of aneurysm, dissection, vasculitis or significant stenosis. SMA: Patent without evidence of aneurysm, dissection, vasculitis or significant stenosis. Renals: Both renal arteries are patent without evidence of aneurysm, dissection, vasculitis, fibromuscular dysplasia or significant stenosis. IMA: Patent without evidence of aneurysm, dissection, vasculitis or significant stenosis. Inflow: Mild calcific atherosclerosis of the common iliac arteries without significant luminal narrowing. Atherosclerosis of both internal carotid arteries and branch vessels of without stenosis. Veins: No obvious venous abnormality within the limitations of this arterial phase study. Review of the MIP images confirms the above findings. NON-VASCULAR Hepatobiliary: No space-occupying mass of the liver. There is no biliary dilatation. The gallbladder is unremarkable. Pancreas: Normal without enhancing mass or inflammation. Stable 4 mm pancreatic ductal dilatation near the ampulla, unchanged. No ductal stone. Spleen: Normal size spleen. Heterogeneous enhancement likely due to timing of contrast bolus. Adrenals/Urinary Tract: Stable hyperdense nodule in the anterior limb of the left adrenal gland measuring 1 cm, stable in appearance from 2013 consistent with a benign finding. Symmetric cortical enhancement of both kidneys without renal mass, nephrolithiasis nor obstructive uropathy. Unremarkable bladder for the degree of distention. Stomach/Bowel: Normal appendix. Nondistended stomach with  unremarkable appearance of the small intestine. No large bowel obstruction or inflammation. Lymphatic: No lymphadenopathy by CT size criteria. Reproductive: Enlarged prostate measuring 5.6 x 4.1 x 4.7 cm. Other: No free air nor free fluid. Musculoskeletal: No acute or significant osseous findings. Review of the MIP images confirms the above findings. IMPRESSION: 1. No thoracic nor abdominal aortic aneurysm or dissection. 2. No acute solid nor hollow visceral organ abnormality. 3. Minimal right coronary arteriosclerosis suggested. 4. Stable 1 cm hyperdense left adrenal nodule dating back to 2013 unchanged in size and appearance compatible with a benign finding. Electronically Signed   By: DAshley RoyaltyM.D.   On: 10/14/2017 20:35    Chart has been reviewed    Assessment/Plan  57y.o. male with medical history significant of CAD, DM, HTN Hyperlipidemia, and Neuropathy, chronic systolic CHF, sleep apnea, dementia    Admitted for chest pain evaluation left-sided weakness  Present on Admission: . Chest pain known history of CAD high risk but somewhat atypical story of negative cardiac markers I would appreciate cardiology consult admit to stepdown given continuous chest pain.  Cycle cardiac enzymes obtain echogram Left-sided weakness unclear etiology recurrent will obtain MRI to evaluate for central abnormality if abnormal will need neurology consult given duration for past 5 days negative CT scan is somewhat reassuring no indication for TPA . Chronic systolic heart failure (HCC) currently appears to be somewhat on the  dry side patient will repeat echogram . Diabetic neuropathy ([place]) continue Neurontin . Essential hypertension continue beta-blocker otherwise allow some permissive hypertension while being evaluated for potential CVA . Gastroesophageal reflux disease currently symptomatic . HLD (hyperlipidemia) stable continue statin . MCI (mild cognitive impairment) we will continue Aricept DM 2 -  -  Order Sensitive  SSI   - continue home insulin regimen      Lantus decrease  20 units in case decreased PO intake ,  -  check TSH and HgA1C Control will benefit from diabetes coordinator consult  - Hold by mouth medications  History of sleep apnea patient has not been using CPAP at home   Other plan as per orders.  DVT prophylaxis:  SCD  Code Status:  FULL CODE  as per patient   I had personally discussed CODE STATUS with patient and family  Family Communication:   Family   at  Bedside  plan of care was discussed with  Sisters   Disposition Plan:    To home once workup is complete and patient is stable                        Would benefit from PT/OT eval prior to DC   ordered                       Diabetes coordinator     consulted                          Consults called: email cardiology  Admission status:   obs   Level of care    SDU          Toy Baker 10/14/2017, 10:49 PM    [facility]  Pager [phone]   after 2 AM please page floor coverage PA If 7AM-7PM, please contact the day team taking care of the patient  [email or web address]  Password [email or web address]

## 2017-10-15 ENCOUNTER — Other Ambulatory Visit: Payer: Self-pay

## 2017-10-15 ENCOUNTER — Encounter (HOSPITAL_COMMUNITY): Payer: Self-pay | Admitting: Student

## 2017-10-15 DIAGNOSIS — E785 Hyperlipidemia, unspecified: Secondary | ICD-10-CM

## 2017-10-15 DIAGNOSIS — E1140 Type 2 diabetes mellitus with diabetic neuropathy, unspecified: Secondary | ICD-10-CM | POA: Diagnosis not present

## 2017-10-15 DIAGNOSIS — R51 Headache: Secondary | ICD-10-CM | POA: Diagnosis not present

## 2017-10-15 DIAGNOSIS — I5022 Chronic systolic (congestive) heart failure: Secondary | ICD-10-CM | POA: Diagnosis not present

## 2017-10-15 DIAGNOSIS — R079 Chest pain, unspecified: Secondary | ICD-10-CM | POA: Diagnosis not present

## 2017-10-15 DIAGNOSIS — Z794 Long term (current) use of insulin: Secondary | ICD-10-CM | POA: Diagnosis not present

## 2017-10-15 DIAGNOSIS — I10 Essential (primary) hypertension: Secondary | ICD-10-CM

## 2017-10-15 LAB — CBC
HCT: 43.2 % (ref 39.0–52.0)
Hemoglobin: 15 g/dL (ref 13.0–17.0)
MCH: 30.4 pg (ref 26.0–34.0)
MCHC: 34.7 g/dL (ref 30.0–36.0)
MCV: 87.4 fL (ref 78.0–100.0)
Platelets: 196 10*3/uL (ref 150–400)
RBC: 4.94 MIL/uL (ref 4.22–5.81)
RDW: 12 % (ref 11.5–15.5)
WBC: 6.7 10*3/uL (ref 4.0–10.5)

## 2017-10-15 LAB — LIPID PANEL
Cholesterol: 219 mg/dL — ABNORMAL HIGH (ref 0–200)
HDL: 49 mg/dL (ref >40)
LDL Cholesterol: 154 mg/dL — ABNORMAL HIGH (ref 0–99)
Total CHOL/HDL Ratio: 4.5 RATIO
Triglycerides: 79 mg/dL (ref <150)
VLDL: 16 mg/dL (ref 0–40)

## 2017-10-15 LAB — URINALYSIS, COMPLETE (UACMP) WITH MICROSCOPIC
Bacteria, UA: NONE SEEN
Bilirubin Urine: NEGATIVE
Glucose, UA: >=500 mg/dL — AB
Hgb urine dipstick: NEGATIVE
Ketones, ur: NEGATIVE mg/dL
Leukocytes, UA: NEGATIVE
Nitrite: NEGATIVE
Protein, ur: NEGATIVE mg/dL
RBC / HPF: NONE SEEN RBC/hpf (ref 0–5)
Specific Gravity, Urine: 1.011 (ref 1.005–1.030)
pH: 5 (ref 5.0–8.0)

## 2017-10-15 LAB — COMPREHENSIVE METABOLIC PANEL
ALT: 14 U/L — ABNORMAL LOW (ref 17–63)
AST: 15 U/L (ref 15–41)
Albumin: 3.3 g/dL — ABNORMAL LOW (ref 3.5–5.0)
Alkaline Phosphatase: 60 U/L (ref 38–126)
Anion gap: 9 (ref 5–15)
BUN: 6 mg/dL (ref 6–20)
CO2: 22 mmol/L (ref 22–32)
Calcium: 8.5 mg/dL — ABNORMAL LOW (ref 8.9–10.3)
Chloride: 105 mmol/L (ref 101–111)
Creatinine, Ser: 1.03 mg/dL (ref 0.61–1.24)
GFR calc Af Amer: >60 mL/min (ref >60)
GFR calc non Af Amer: >60 mL/min (ref >60)
Glucose, Bld: 257 mg/dL — ABNORMAL HIGH (ref 65–99)
Potassium: 4.1 mmol/L (ref 3.5–5.1)
Sodium: 136 mmol/L (ref 135–145)
Total Bilirubin: 0.5 mg/dL (ref 0.3–1.2)
Total Protein: 5.8 g/dL — ABNORMAL LOW (ref 6.5–8.1)

## 2017-10-15 LAB — HEMOGLOBIN A1C
Hgb A1c MFr Bld: 10.3 % — ABNORMAL HIGH (ref 4.8–5.6)
Mean Plasma Glucose: 248.91 mg/dL

## 2017-10-15 LAB — TROPONIN I
Troponin I: <0.03 ng/mL (ref <0.03)
Troponin I: <0.03 ng/mL (ref <0.03)

## 2017-10-15 LAB — PHOSPHORUS: Phosphorus: 2.9 mg/dL (ref 2.5–4.6)

## 2017-10-15 LAB — CBG MONITORING, ED
Glucose-Capillary: 226 mg/dL — ABNORMAL HIGH (ref 65–99)
Glucose-Capillary: 223 mg/dL — ABNORMAL HIGH (ref 65–99)

## 2017-10-15 LAB — HIV ANTIBODY (ROUTINE TESTING W REFLEX): HIV Screen 4th Generation wRfx: NONREACTIVE

## 2017-10-15 LAB — TSH: TSH: 1.362 u[IU]/mL (ref 0.350–4.500)

## 2017-10-15 LAB — MAGNESIUM: Magnesium: 1.9 mg/dL (ref 1.7–2.4)

## 2017-10-15 MED ORDER — ASPIRIN 300 MG RE SUPP: 300.0000 mg | Freq: Every day | RECTAL | Status: DC

## 2017-10-15 MED ORDER — DONEPEZIL HCL 10 MG PO TABS: 10.0000 mg | ORAL_TABLET | Freq: Every day | ORAL | Status: DC

## 2017-10-15 MED ORDER — ONDANSETRON HCL 4 MG/2ML IJ SOLN: 4.0000 mg | Freq: Four times a day (QID) | INTRAMUSCULAR | Status: DC | PRN

## 2017-10-15 MED ORDER — STROKE: EARLY STAGES OF RECOVERY BOOK
Freq: Once | Status: DC
  Filled 2017-10-15: qty 1

## 2017-10-15 MED ORDER — HYDROCODONE-ACETAMINOPHEN 5-325 MG PO TABS: 1.0000 | ORAL_TABLET | ORAL | Status: DC | PRN

## 2017-10-15 MED ORDER — CARVEDILOL 3.125 MG PO TABS
3.1250 mg | ORAL_TABLET | Freq: Two times a day (BID) | ORAL | Status: DC
  Administered 2017-10-15: 3.125 mg via ORAL
  Filled 2017-10-15 (×2): qty 1

## 2017-10-15 MED ORDER — GABAPENTIN 250 MG/5ML PO SOLN
300.0000 mg | Freq: Two times a day (BID) | ORAL | Status: DC
  Administered 2017-10-15: 300 mg via ORAL
  Filled 2017-10-15: qty 6

## 2017-10-15 MED ORDER — ACETAMINOPHEN 325 MG PO TABS: 650.0000 mg | ORAL_TABLET | Freq: Four times a day (QID) | ORAL | Status: DC | PRN

## 2017-10-15 MED ORDER — SENNOSIDES-DOCUSATE SODIUM 8.6-50 MG PO TABS: 1.0000 | ORAL_TABLET | Freq: Every evening | ORAL | Status: DC | PRN

## 2017-10-15 MED ORDER — SODIUM CHLORIDE 0.9 % IV SOLN
INTRAVENOUS | Status: DC
  Administered 2017-10-15: 12:00:00 via INTRAVENOUS

## 2017-10-15 MED ORDER — INSULIN DETEMIR 100 UNIT/ML ~~LOC~~ SOLN
20.0000 [IU] | Freq: Every day | SUBCUTANEOUS | Status: DC
  Filled 2017-10-15: qty 0.2

## 2017-10-15 MED ORDER — ATORVASTATIN CALCIUM 40 MG PO TABS
40.0000 mg | ORAL_TABLET | Freq: Every evening | ORAL | Status: DC
  Filled 2017-10-15: qty 1

## 2017-10-15 MED ORDER — ASPIRIN 325 MG PO TABS: 325.0000 mg | ORAL_TABLET | Freq: Every day | ORAL | Status: DC

## 2017-10-15 MED ORDER — ACETAMINOPHEN 650 MG RE SUPP: 650.0000 mg | Freq: Four times a day (QID) | RECTAL | Status: DC | PRN

## 2017-10-15 MED ORDER — INSULIN ASPART 100 UNIT/ML ~~LOC~~ SOLN: 0.0000 [IU] | Freq: Every day | SUBCUTANEOUS | Status: DC

## 2017-10-15 MED ORDER — ONDANSETRON HCL 4 MG PO TABS: 4.0000 mg | ORAL_TABLET | Freq: Four times a day (QID) | ORAL | Status: DC | PRN

## 2017-10-15 MED ORDER — INSULIN ASPART 100 UNIT/ML ~~LOC~~ SOLN
0.0000 [IU] | Freq: Three times a day (TID) | SUBCUTANEOUS | Status: DC
  Administered 2017-10-15 (×2): 3 [IU] via SUBCUTANEOUS
  Filled 2017-10-15 (×2): qty 1

## 2017-10-15 NOTE — Discharge Summary (Addendum)
DISCHARGE SUMMARY  Deronte Solis  MR#: [id number]  DOB:1961-03-05  Date of Admission: 10/14/2017 Date of Discharge: 10/15/2017  Attending Physician:Jeffrey T McClung  Patient's XQJ:JHERDE, [name], DO  Consults:  [facility]   Disposition: D/C home   Follow-up Appts: Follow-up Information    [name], DO Follow up in 1 week(s).   Specialty:  Internal Medicine Contact information: [place] [address] [phone]        [name], MD .   Specialty:  Cardiology Contact information: [address] [place] [address] [phone]           Discharge Diagnoses: Atypical Chest Pain Nonischemic Cardiomyopathy Left-Sided Weakness HLD Uncontrolled DM  Initial presentation: 57 y.o. male with a history of CAD, DM, HTN Hyperlipidemia, Neuropathy, chronic systolic CHF, sleep apnea, and mild dementia who presented to the ED with a sharp left-sided chest and shoulder pain with radiation to left arm, associated with shortness of breath and lightheadedness.  As per Cardiology: "Mr. Munter was recently evaluated by Dr. Angelena Form in 08/2017 and reported having frequent episodes of chest pain for over the past 3 years with associated dyspnea. With his ongoing symptoms and reported history of CAD, a repeat cardiac catheterization was arranged. This was performed on 08/25/2017 and showed mild nonobstructive CAD with 20-30% scattered stenosis along LAD, LCx, and RCA. EF was estimated at 35-45% and a repeat echocardiogram was recommended. This showed his EF was reduced at 20-25% with diffuse HK and Grade 1 DD.   At the time of his follow-up visit on 09/15/2017, he was continued on Coreg 3.179m BID and Lisinopril 532mdaily as BP did not allow for further titration of his medications and he reported frequent dizziness concerning for orthostasis.   [name]2017 for evaluation of chest pain  which was radiating down his left arm. Reports having pain for the past 2-3 weeks which he describes as a sharp pain, 10/10, and can last for several days at a time. No association with exertion or positional changes. Also reported having a headache after hitting his nose on a table but denied any loss of consciousness. Did experience left-sided numbness along his upper and lower extremities. Denies any recent dyspnea on exertion, orthopnea, PND, or lower extremity edema.   Initial labs show WBC 5.9, Hgb 16.7, platelets 214, Na+ 136, K+ 4.1, and creatinine 1.12. BNP slightly elevated at 130.9. Initial and delta troponin have been negative. UDS negative. EKG shows NSR, HR 90, with diffuse TWI along inferior and lateral leads. CXR showing no active cardiopulmonary disease. CTA showing no evidence of abdominal aortic aneurysm or dissection and stable left adrenal nodule. CT Head with no acute abnormalities. MRI of Brain with no acute findings."   Hospital Course: The patient was admitted by the [facility] service.  Serial troponins were cycled and were in fact unremarkable.  EKGs remained unremarkable.  The patient's pain greatly improved during the course of his hospital stay.  Cardiology was consulted given his extensive cardiac history.  As per Cardiology "Chest pain has resolved.   Unlikely cardiac. Conservative management advised.  No further inpatient cardiology workup.  Follow-up with cardiology as previously scheduled as an outpatient."  The pt has also reported some L sided weakness at the time of his presentation.  This was not present at the time of his d/c.  MRI/A of the brain was obtained during his hospital stay and was without acute findings.  The pt was in agreement with d/c home, and no outstanding medical issues warranted prolongation of his hospital stay.     No adjustments were made in his usual home medications.  He was d/c home in stable condition.    Allergies as of  10/15/2017   No Known Allergies     Medication List    TAKE these medications   aspirin EC 81 MG tablet Take 81 mg by mouth daily.   atorvastatin 40 MG tablet Commonly known as:  LIPITOR Take 1 tablet (40 mg total) by mouth daily.   carvedilol 3.125 MG tablet Commonly known as:  COREG Take 1 tablet (3.125 mg total) by mouth 2 (two) times daily with a meal. For blood pressure   docusate sodium 100 MG capsule Commonly known as:  COLACE Take 2 capsules (200 mg total) by mouth daily.   donepezil 10 MG tablet Commonly known as:  ARICEPT Take 1 tablet (10 mg total) by mouth at bedtime.   Gabapentin 300 MG/6ML Soln Take 300 mg by mouth 2 (two) times daily. For nerve pain   insulin detemir 100 UNIT/ML injection Commonly known as:  LEVEMIR Inject 26 units subcut daily for diabetes   INSULIN SYRINGE .3CC/29GX1/2" 29G X 1/2" 0.3 ML Misc Inject 1 Syringe 3 (three) times daily as directed. Check blood sugar three times daily. Dx: E11.9   lisinopril 5 MG tablet Commonly known as:  PRINIVIL,ZESTRIL Take 1 tablet (5 mg total) by mouth daily. For blood pressure and kidney protection   Metformin HCl 500 MG/5ML Soln Take 10 mLs (1,000 mg total) by mouth 2 (two) times daily. For diabetes   nitroGLYCERIN 0.4 MG SL tablet Commonly known as:  NITROSTAT Place 1 tablet (0.4 mg total) under the tongue every 5 (five) minutes as needed for chest pain.   TRUE METRIX AIR GLUCOSE METER w/Device Kit 1 strip 3 (three) times daily by Does not apply route. Use 1 strip to check blood sugar three (3) times daily. Dx:E11.9       Day of Discharge BP (!) 120/57   Pulse 86   Temp 98.1 F (36.7 C)   Resp 13   Ht 5' 6" (1.676 m)   Wt 75.8 kg (167 lb)   SpO2 98%   BMI 26.95 kg/m   Physical Exam: General: No acute respiratory distress Lungs: Clear to auscultation bilaterally without wheezes or crackles Cardiovascular: Regular rate and rhythm without murmur gallop or rub normal S1 and  S2 Abdomen: Nontender, nondistended, soft, bowel sounds positive, no rebound, no ascites, no appreciable mass Extremities: No significant cyanosis, clubbing, or edema bilateral lower extremities  Basic Metabolic Panel: Recent Labs  Lab 10/14/17 1444 10/15/17 0754  NA 136 136  K 4.1 4.1  CL 102 105  CO2 25 22  GLUCOSE 288* 257*  BUN 8 6  CREATININE 1.12 1.03  CALCIUM 9.3 8.5*  MG  --  1.9  PHOS  --  2.9    Liver Function Tests: Recent Labs  Lab 10/15/17 0754  AST 15  ALT 14*  ALKPHOS 60  BILITOT 0.5  PROT 5.8*  ALBUMIN 3.3*    CBC: Recent Labs  Lab 10/14/17 1444 10/15/17 0754  WBC 5.9 6.7  HGB 16.7 15.0  HCT 47.4 43.2  MCV 88.8 87.4  PLT 214 196    Cardiac Enzymes: Recent Labs  Lab 10/14/17 2146 10/15/17 0424 10/15/17 0754  TROPONINI <0.03 <0.03 <0.03   BNP (last 3 results) Recent Labs    10/14/17  1444  BNP 130.9*    CBG: Recent Labs  Lab 10/15/17 0754 10/15/17 1208  GLUCAP 226* 223*    Time spent in discharge (includes decision making & examination of pt): 30 minutes  10/15/2017, 2:11 PM   Cherene Altes, MD [facility] Office  [phone] Pager [phone]  On-Call/Text Page:      [email or web address]      password [facility]

## 2017-10-15 NOTE — Consult Note (Addendum)
Cardiology Consult    Patient ID: Calvin [name]; [id number]; 1960/11/12   Admit date: 10/14/2017 Date of Consult: 10/15/2017  Primary Care Provider: [name], DO Primary Cardiologist: [name], MD    Patient Profile    Calvin [name] is a 57 y.o. male with past medical history of CAD (nonobstructive CAD by recent cath in 08/2017), nonischemic cardiomyopathy (EF 20-25% by echo in 09/2017), HTN, HLD, IDDM, and OSA who is being seen today for the evaluation of chest pain at the request of Dr. Roel Cluck.   History of Present Illness    Calvin [name] was recently evaluated by Dr. Angelena Form in 08/2017 and reported having frequent episodes of chest pain for over the past 3 years with associated dyspnea. With his ongoing symptoms and reported history of CAD, a repeat cardiac catheterization was arranged. This was performed on 08/25/2017 and showed mild nonobstructive CAD with 20-30% scattered stenosis along LAD, LCx, and RCA. EF was estimated at 35-45% and a repeat echocardiogram was recommended. This showed his EF was reduced at 20-25% with diffuse HK and Grade 1 DD.   At the time of his follow-up visit on 09/15/2017, he was continued on Coreg 3.130m BID and Lisinopril 542mdaily as BP did not allow for further titration of his medications and he reported frequent dizziness concerning for orthostasis.   [name] for evaluation of chest pain which was radiating down his left arm. Reports having pain for the past 2-3 weeks which he describes as a sharp pain, 10/10, and can last for several days at a time. No association with exertion or positional changes. Also reported having a headache after hitting his nose on a table but denied any loss of consciousness. Did experience left-sided numbness along his upper and lower extremities. Denies any recent dyspnea on exertion, orthopnea, PND, or lower extremity edema.   Initial labs show WBC 5.9, Hgb  16.7, platelets 214, Na+ 136, K+ 4.1, and creatinine 1.12. BNP slightly elevated at 130.9. Initial and delta troponin have been negative. UDS negative. EKG shows NSR, HR 90, with diffuse TWI along inferior and lateral leads. CXR showing no active cardiopulmonary disease. CTA showing no evidence of abdominal aortic aneurysm or dissection and stable left adrenal nodule. CT Head with no acute abnormalities. MRI of Brain with no acute findings.   Past Medical History:  Diagnosis Date  . Adenoidal hypertrophy   . Adenomatous colon polyps 2012  . Arthritis   . CAD (coronary artery disease)    a. cath in 08/2017 showing mild nonobstructive CAD with scattered 20-30% stenosis.   . Diabetes mellitus   . GERD (gastroesophageal reflux disease)   . [place]  . Marland Kitchenx of adenomatous colonic polyps 08/18/2017  . Hyperlipidemia   . Hypertension   . [place]   a. EF 20-25% by echo in 09/2017 with cath showing mild CAD.   . Marland Kitchenbesity   . Sleep apnea    cpap    Past Surgical History:  Procedure Laterality Date  . COLONOSCOPY  05/13/11   9 adenomas  . FOREARM SURGERY    . MUSCLE BIOPSY    . RIGHT/LEFT HEART CATH AND CORONARY ANGIOGRAPHY N/A 08/25/2017   Procedure: RIGHT/LEFT HEART CATH AND CORONARY ANGIOGRAPHY;  Surgeon: McBurnell BlanksMD;  [facility];  Service: Cardiovascular;  Laterality: N/A;     Home Medications:  Prior to Admission medications  Medication Sig Start Date End Date Taking? Authorizing Provider  aspirin EC 81 MG tablet Take 81 mg by mouth daily.   Yes [provider]  atorvastatin (LIPITOR) 40 MG tablet Take 1 tablet (40 mg total) by mouth daily. 09/15/17 12/14/17 Yes Simmons, Brittainy M, [name]  carvedilol (COREG) 3.125 MG tablet Take 1 tablet (3.125 mg total) by mouth 2 (two) times daily with a meal. For blood pressure 07/28/17  Yes [name], DO  docusate sodium (COLACE) 100 MG capsule Take  2 capsules (200 mg total) by mouth daily. 07/25/17  Yes [name], [name], DO  insulin detemir (LEVEMIR) 100 UNIT/ML injection Inject 26 units subcut daily for diabetes 07/25/17  Yes [name], [name], DO  Blood Glucose Monitoring Suppl (TRUE METRIX AIR GLUCOSE METER) w/Device KIT 1 strip 3 (three) times daily by Does not apply route. Use 1 strip to check blood sugar three (3) times daily. Dx:E11.9 Patient not taking: Reported on 09/26/2017 05/10/17   [name], DO  donepezil (ARICEPT) 10 MG tablet Take 1 tablet (10 mg total) by mouth at bedtime. 07/25/17   [name], DO  Gabapentin 300 MG/6ML SOLN Take 300 mg by mouth 2 (two) times daily. For nerve pain 09/26/17   [name], DO  Insulin Syringe-Needle U-100 (INSULIN SYRINGE .3CC/29GX1/2") 29G X 1/2" 0.3 ML MISC Inject 1 Syringe 3 (three) times daily as directed. Check blood sugar three times daily. Dx: E11.9 05/10/17   [name], DO  lisinopril (PRINIVIL,ZESTRIL) 5 MG tablet Take 1 tablet (5 mg total) by mouth daily. For blood pressure and kidney protection 07/28/17   [name], DO  Metformin HCl 500 MG/5ML SOLN Take 10 mLs (1,000 mg total) by mouth 2 (two) times daily. For diabetes 09/26/17   [name], DO  nitroGLYCERIN (NITROSTAT) 0.4 MG SL tablet Place 1 tablet (0.4 mg total) under the tongue every 5 (five) minutes as needed for chest pain. 07/28/17   [name], DO    Inpatient Medications: Scheduled Meds: .  stroke: mapping our early stages of recovery book   Does not apply Once  . atorvastatin  40 mg Oral QPM  . carvedilol  3.125 mg Oral BID WC  . donepezil  10 mg Oral QHS  . Gabapentin  300 mg Oral BID  . insulin aspart  0-5 Units Subcutaneous QHS  . insulin aspart  0-9 Units Subcutaneous TID WC  . insulin detemir  20 Units Subcutaneous QHS   Continuous Infusions: . sodium chloride     PRN Meds: acetaminophen **OR** acetaminophen, HYDROcodone-acetaminophen, ondansetron **OR** ondansetron (ZOFRAN) IV,  senna-docusate  Allergies:   No Known Allergies  Social History:   Social History   Socioeconomic History  . Marital status: Single    Spouse name: Not on file  . Number of children: 0  . Years of education: Not on file  . Highest education level: Not on file  Occupational History  . Occupation: Disability  Social Needs  . Financial resource strain: Not on file  . Food insecurity:    Worry: Not on file    Inability: Not on file  . Transportation needs:    Medical: Not on file    Non-medical: Not on file  Tobacco Use  . Smoking status: Never Smoker  . Smokeless tobacco: Never Used  Substance and Sexual Activity  . Alcohol use: No  . Drug use: No  . Sexual activity: Yes    Birth control/protection: None  Lifestyle  . Physical activity:    Days  per week: Not on file    Minutes per session: Not on file  . Stress: Not on file  Relationships  . Social connections:    Talks on phone: Not on file    Gets together: Not on file    Attends religious service: Not on file    Active member of club or organization: Not on file    Attends meetings of clubs or organizations: Not on file    Relationship status: Not on file  . Intimate partner violence:    Fear of current or ex partner: Not on file    Emotionally abused: Not on file    Physically abused: Not on file    Forced sexual activity: Not on file  Other Topics Concern  . Not on file  Social History Narrative   Social History      Diet?       Do you drink/eat things with caffeine? yes      Marital status?       single                             What year were you married?      Do you live in a house, apartment, assisted living, condo, trailer, etc.? yes      Is it one or more stories? One story      How many persons live in your home?      Do you have any pets in your home? (please list) none      Highest level of education completed? graduate      Current or past profession:      Do you exercise?             no                          Type & how often?      Advanced Directives      Do you have a living will? no      Do you have a DNR form?                                  If not, do you want to discuss one? no      Do you have signed POA/HPOA for forms? no      Functional Status      Do you have difficulty bathing or dressing yourself? no      Do you have difficulty preparing food or eating? no      Do you have difficulty managing your medications? no      Do you have difficulty managing your finances? no      Do you have difficulty affording your medications? no     Family History:    Family History  Problem Relation Age of Onset  . Heart disease Mother   . Heart attack Mother 72  . Hypertension Mother   . Hyperlipidemia Mother   . Heart disease Father   . Heart attack Father 48  . Hypertension Father   . Hyperlipidemia Father   . Heart attack Sister 5  . Colon cancer Neg Hx   . Stomach cancer Neg Hx   . Esophageal cancer Neg Hx   . Rectal cancer Neg Hx       Review of Systems  General:  No chills, fever, night sweats or weight changes.  Cardiovascular:  No dyspnea on exertion, edema, orthopnea, palpitations, paroxysmal nocturnal dyspnea. Positive for chest pain.  Dermatological: No rash, lesions/masses Respiratory: No cough, dyspnea Urologic: No hematuria, dysuria Abdominal:   No nausea, vomiting, diarrhea, bright red blood per rectum, melena, or hematemesis Neurologic:  No visual changes, wkns, changes in mental status. Positive for left-sided weakness.   All other systems reviewed and are otherwise negative except as noted above.  Physical Exam/Data    Vitals:   10/15/17 0722 10/15/17 0730 10/15/17 0800 10/15/17 0805  BP:  133/75 (!) 141/88 (!) 141/88  Pulse:  97 79   Resp:  (!) 21 12   Temp: 98.1 F (36.7 C)     TempSrc:      SpO2:  99% 100%   Weight:      Height:        Intake/Output Summary (Last 24 hours) at 10/15/2017 0837 Last data  filed at 10/14/2017 2221 Gross per 24 hour  Intake 975 ml  Output -  Net 975 ml   Filed Weights   10/14/17 1450  Weight: 167 lb (75.8 kg)   Body mass index is 26.95 kg/m.   General: Pleasant, African American male appearing in NAD Psych: Normal affect. Neuro: Alert and oriented X 3. Moves all extremities spontaneously. HEENT: Normal  Neck: Supple without bruits or JVD. Lungs:  Resp regular and unlabored, CTA without wheezing or rales. Heart: RRR no s3, s4, or murmurs. Abdomen: Soft, non-tender, non-distended, BS + x 4.  Extremities: No clubbing, cyanosis or edema. DP/PT/Radials 2+ and equal bilaterally.   EKG:  The EKG was personally reviewed and demonstrates: NSR, HR 90, with diffuse TWI along inferior and lateral leads.    Labs/Studies     Relevant CV Studies:  Cardiac Catheterization: 08/25/2017  Prox RCA to Mid RCA lesion is 30% stenosed.  Mid RCA to Dist RCA lesion is 30% stenosed.  Prox Cx to Dist Cx lesion is 20% stenosed.  Mid LAD lesion is 30% stenosed.  There is mild to moderate left ventricular systolic dysfunction.  LV end diastolic pressure is mildly elevated.  The left ventricular ejection fraction is 35-45% by visual estimate.  There is no mitral valve regurgitation.   1. Mild non-obstructive CAD 2. Moderate LV systolic dysfunction. Non-ischemic cardiomyopathy 3. Normal filling pressures.   Recommendations: Medical management of CAD and cardiomyopathy.   Echocardiogram: 09/04/2017 Study Conclusions  - Left ventricle: The cavity size was normal. Wall thickness was   increased in a pattern of mild LVH. Systolic function was   severely reduced. The estimated ejection fraction was in the   range of 20% to 25%. Diffuse hypokinesis. Doppler parameters are   consistent with abnormal left ventricular relaxation (grade 1   diastolic dysfunction). - Aortic valve: There was trivial regurgitation.  Impressions:  - Severe global reduction in  LV systolic function; mild LVH; mild   diastolic dysfunction; trace AI, MR and TR.  Laboratory Data:  Chemistry Recent Labs  Lab 10/14/17 1444  NA 136  K 4.1  CL 102  CO2 25  GLUCOSE 288*  BUN 8  CREATININE 1.12  CALCIUM 9.3  GFRNONAA >60  GFRAA >60  ANIONGAP 9    No results for input(s): PROT, ALBUMIN, AST, ALT, ALKPHOS, BILITOT in the last 168 hours. Hematology Recent Labs  Lab 10/14/17 1444 10/15/17 0754  WBC 5.9 6.7  RBC 5.34 4.94  HGB 16.7 15.0  HCT 47.4 43.2  MCV 88.8 87.4  MCH 31.3 30.4  MCHC 35.2 34.7  RDW 11.9 12.0  PLT 214 196   Cardiac Enzymes Recent Labs  Lab 10/14/17 2146 10/15/17 0424  TROPONINI <0.03 <0.03    Recent Labs  Lab 10/14/17 1503  TROPIPOC 0.00    BNP Recent Labs  Lab 10/14/17 1444  BNP 130.9*    DDimer No results for input(s): DDIMER in the last 168 hours.  Radiology/Studies:  Dg Chest 2 View  Result Date: 10/14/2017 CLINICAL DATA:  Left-sided chest pain with radiation to left arm. Shortness of breath. Dizziness. Previous myocardial infarct. EXAM: CHEST - 2 VIEW COMPARISON:  03/18/2017 FINDINGS: The heart size and mediastinal contours are within normal limits. Stable prominence of epicardial fat pad in the right cardiophrenic angle. Both lungs are clear. The visualized skeletal structures are unremarkable. IMPRESSION: Stable exam.  No active cardiopulmonary disease. Electronically Signed   By: Earle Gell M.D.   On: 10/14/2017 15:15   Ct Head Wo Contrast  Result Date: 10/14/2017 CLINICAL DATA:  Headache and dizziness. EXAM: CT HEAD WITHOUT CONTRAST TECHNIQUE: Contiguous axial images were obtained from the base of the skull through the vertex without intravenous contrast. COMPARISON:  Head CT February 12, 2010 FINDINGS: Brain: The ventricles are normal in size and configuration. There is no intracranial mass, hemorrhage, extra-axial fluid collection, or midline shift. The gray-white compartments appear normal. No evident acute  infarct. Vascular: No hyperdense vessel. There is mild calcification in each carotid siphon. Skull: The bony calvarium appears intact. Sinuses/Orbits: There is mucosal thickening in several ethmoid air cells. There is a retention cyst in the superior posterior right maxillary antrum. Other visualized paranasal sinuses are clear. Orbits appear symmetric bilaterally. Other: Mastoids on the left are clear. Mastoids on the right are nearly aplastic. IMPRESSION: No intracranial mass or hemorrhage. Gray-white compartments appear normal. There are foci of arterial vascular calcification. There are foci of paranasal sinus disease. Mastoids on the right are nearly aplastic. Electronically Signed   By: Lowella Grip III M.D.   On: 10/14/2017 20:23   Mr Jodene Nam Head Wo Contrast  Result Date: 10/15/2017 CLINICAL DATA:  Struck face on table yesterday, headache. Gait imbalance, LEFT body numbness for 5 days. Suspect stroke. History of hypertension, hyperlipidemia, diabetes. EXAM: MRI HEAD WITHOUT CONTRAST MRA HEAD WITHOUT CONTRAST TECHNIQUE: Multiplanar, multiecho pulse sequences of the brain and surrounding structures were obtained without intravenous contrast. Angiographic images of the head were obtained using MRA technique without contrast. COMPARISON:  CT HEAD October 14, 2017 and MRA of the head June 17, 2006. FINDINGS: MRI HEAD FINDINGS INTRACRANIAL CONTENTS: No reduced diffusion to suggest acute ischemia. Chronic microhemorrhage LEFT cerebellum. The ventricles and sulci are normal for patient's age. Faint supratentorial pontine white matter FLAIR T2 hyperintensities compatible with minimal chronic small vessel ischemic disease. No suspicious parenchymal signal, masses, mass effect. No abnormal extra-axial fluid collections. No extra-axial masses. VASCULAR: Normal major intracranial vascular flow voids present at skull base. SKULL AND UPPER CERVICAL SPINE: No abnormal sellar expansion. No suspicious calvarial bone  marrow signal. Craniocervical junction maintained. SINUSES/ORBITS: RIGHT maxillary mucosal retention cyst. Under pneumatized RIGHT mastoid air cells with trace fluid signal.The included ocular globes and orbital contents are non-suspicious. OTHER: None. MRA HEAD FINDINGS ANTERIOR CIRCULATION: Normal flow related enhancement of the included cervical, petrous, cavernous and supraclinoid internal carotid arteries. Patent anterior communicating artery. Patent anterior and middle cerebral arteries, including distal segments. No large vessel occlusion, flow limiting stenosis, aneurysm. POSTERIOR CIRCULATION: LEFT vertebral artery is  dominant. Basilar artery is patent, with normal flow related enhancement of the main branch vessels. Patent posterior cerebral arteries. RIGHT communicating artery present. No large vessel occlusion, flow limiting stenosis,  aneurysm. ANATOMIC VARIANTS: None. Source images and MIP images were reviewed. IMPRESSION: MRI HEAD: 1. No acute intracranial process. 2. Minimal chronic small vessel ischemic changes. MRA HEAD: 1. Normal noncontrast MRA head. Electronically Signed   By: Elon Alas M.D.   On: 10/15/2017 00:55   Mr Brain Wo Contrast  Result Date: 10/15/2017 CLINICAL DATA:  Struck face on table yesterday, headache. Gait imbalance, LEFT body numbness for 5 days. Suspect stroke. History of hypertension, hyperlipidemia, diabetes. EXAM: MRI HEAD WITHOUT CONTRAST MRA HEAD WITHOUT CONTRAST TECHNIQUE: Multiplanar, multiecho pulse sequences of the brain and surrounding structures were obtained without intravenous contrast. Angiographic images of the head were obtained using MRA technique without contrast. COMPARISON:  CT HEAD October 14, 2017 and MRA of the head June 17, 2006. FINDINGS: MRI HEAD FINDINGS INTRACRANIAL CONTENTS: No reduced diffusion to suggest acute ischemia. Chronic microhemorrhage LEFT cerebellum. The ventricles and sulci are normal for patient's age. Faint  supratentorial pontine white matter FLAIR T2 hyperintensities compatible with minimal chronic small vessel ischemic disease. No suspicious parenchymal signal, masses, mass effect. No abnormal extra-axial fluid collections. No extra-axial masses. VASCULAR: Normal major intracranial vascular flow voids present at skull base. SKULL AND UPPER CERVICAL SPINE: No abnormal sellar expansion. No suspicious calvarial bone marrow signal. Craniocervical junction maintained. SINUSES/ORBITS: RIGHT maxillary mucosal retention cyst. Under pneumatized RIGHT mastoid air cells with trace fluid signal.The included ocular globes and orbital contents are non-suspicious. OTHER: None. MRA HEAD FINDINGS ANTERIOR CIRCULATION: Normal flow related enhancement of the included cervical, petrous, cavernous and supraclinoid internal carotid arteries. Patent anterior communicating artery. Patent anterior and middle cerebral arteries, including distal segments. No large vessel occlusion, flow limiting stenosis, aneurysm. POSTERIOR CIRCULATION: LEFT vertebral artery is dominant. Basilar artery is patent, with normal flow related enhancement of the main branch vessels. Patent posterior cerebral arteries. RIGHT communicating artery present. No large vessel occlusion, flow limiting stenosis,  aneurysm. ANATOMIC VARIANTS: None. Source images and MIP images were reviewed. IMPRESSION: MRI HEAD: 1. No acute intracranial process. 2. Minimal chronic small vessel ischemic changes. MRA HEAD: 1. Normal noncontrast MRA head. Electronically Signed   By: Elon Alas M.D.   On: 10/15/2017 00:55   Ct Angio Chest/abd/pel For Dissection W And/or W/wo  Result Date: 10/14/2017 CLINICAL DATA:  Left-sided chest pain radiating to the left arm with dizziness and dyspnea. History of myocardial infarction and diabetes. EXAM: CT ANGIOGRAPHY CHEST, ABDOMEN AND PELVIS TECHNIQUE: Multidetector CT imaging through the chest, abdomen and pelvis was performed using the  standard protocol during bolus administration of intravenous contrast. Multiplanar reconstructed images and MIPs were obtained and reviewed to evaluate the vascular anatomy. CONTRAST:  158m ISOVUE-370 IOPAMIDOL (ISOVUE-370) INJECTION 76% COMPARISON:  CXR 10/14/2017, CT chest, abdomen and pelvis from 12/20/2011 FINDINGS: CTA CHEST FINDINGS Cardiovascular: Normal branch pattern of the great vessels without stenosis, dissection or aneurysm. Mild ectasia of the thoracic aorta without aneurysm or dissection. Minimal aortic atherosclerosis at the arch and descending aorta. Borderline stable cardiomegaly without pericardial effusion. No acute pulmonary embolus. Minimal coronary arteriosclerosis suggested along the RCA. Mediastinum/Nodes: No enlarged mediastinal, hilar, or axillary lymph nodes. Thyroid gland, trachea, and esophagus demonstrate no significant findings. Lungs/Pleura: Lungs are clear given motion degraded artifacts. No pleural effusion or pneumothorax. Musculoskeletal: No chest wall abnormality. No acute or significant osseous findings. Review of the MIP images  confirms the above findings. CTA ABDOMEN AND PELVIS FINDINGS VASCULAR Aorta: Normal caliber aorta without aneurysm, dissection, vasculitis or significant stenosis. Celiac: Patent without evidence of aneurysm, dissection, vasculitis or significant stenosis. SMA: Patent without evidence of aneurysm, dissection, vasculitis or significant stenosis. Renals: Both renal arteries are patent without evidence of aneurysm, dissection, vasculitis, fibromuscular dysplasia or significant stenosis. IMA: Patent without evidence of aneurysm, dissection, vasculitis or significant stenosis. Inflow: Mild calcific atherosclerosis of the common iliac arteries without significant luminal narrowing. Atherosclerosis of both internal carotid arteries and branch vessels of without stenosis. Veins: No obvious venous abnormality within the limitations of this arterial phase study.  Review of the MIP images confirms the above findings. NON-VASCULAR Hepatobiliary: No space-occupying mass of the liver. There is no biliary dilatation. The gallbladder is unremarkable. Pancreas: Normal without enhancing mass or inflammation. Stable 4 mm pancreatic ductal dilatation near the ampulla, unchanged. No ductal stone. Spleen: Normal size spleen. Heterogeneous enhancement likely due to timing of contrast bolus. Adrenals/Urinary Tract: Stable hyperdense nodule in the anterior limb of the left adrenal gland measuring 1 cm, stable in appearance from 2013 consistent with a benign finding. Symmetric cortical enhancement of both kidneys without renal mass, nephrolithiasis nor obstructive uropathy. Unremarkable bladder for the degree of distention. Stomach/Bowel: Normal appendix. Nondistended stomach with unremarkable appearance of the small intestine. No large bowel obstruction or inflammation. Lymphatic: No lymphadenopathy by CT size criteria. Reproductive: Enlarged prostate measuring 5.6 x 4.1 x 4.7 cm. Other: No free air nor free fluid. Musculoskeletal: No acute or significant osseous findings. Review of the MIP images confirms the above findings. IMPRESSION: 1. No thoracic nor abdominal aortic aneurysm or dissection. 2. No acute solid nor hollow visceral organ abnormality. 3. Minimal right coronary arteriosclerosis suggested. 4. Stable 1 cm hyperdense left adrenal nodule dating back to 2013 unchanged in size and appearance compatible with a benign finding. Electronically Signed   By: Ashley Royalty M.D.   On: 10/14/2017 20:35     Assessment & Plan    1. Atypical Chest Pain - reports episodes of a shooting chest pain occurring at rest and lasting for several days at a time. Pain is not worse with exertion or positional changes. No associated symptoms. Recent catheterization in 08/2017 showed mild nonobstructive CAD with 20-30% scattered stenosis along LAD, LCx, and RCA. - initial and delta troponin values  have been negative with EKG showing NSR, HR 90, with diffuse TWI along inferior and lateral leads (noted on prior tracings).  - with his atypical symptoms and recent cardiac catheterization less than 2 months ago showing mild CAD, would not plan for further ischemic evaluation this admission.  - continue statin and BB therapy.   2. Nonischemic Cardiomyopathy - EF 40% by echo in 2017 with repeat imagining in 09/2017 showing EF is further reduced at 20-25%. Will cancel repeat echo as this was just obtained last month.  - BNP minimally elevated at 130. CXR shows no acute findings.  - on Coreg and Lisinopril PTA. Lisinopril held for unclear reasons. Once held for > 36 hours, can consider initiation of low-dose Entersto 24-50m BID given his reduced EF (this can also be performed as an outpatient if discharged prior to this timeframe). Would need repeat BMET in 2 weeks following initiation of this.   3. Left-Sided Weakness - CT Head and MRI without acute findings. No focal neurological deficits by examination.  - per admitting team.   4. HLD - continue Atorvastatin 432mdaily. Goal LDL < 70 with known  CAD.    For questions or updates, please contact [facility] Please consult [email or web address] for contact info under Cardiology/STEMI.  Signed, Erma Heritage, [name] 10/15/2017, 8:37 AM Pager: [phone]   I have seen, examined the patient, and reviewed the above assessment and plan.  Changes to above are made where necessary.  On exam, resting comfortably.  NAD.  Chest pain has resolved.   Unlikely cardiac.  Conservative management advised.  No further inpatient cardiology workup.  Follow-up with cardiology as previously scheduled as an outpatient.   Please call with questions.  Co Sign: Thompson Grayer, MD 10/15/2017 11:40 AM

## 2017-10-15 NOTE — ED Notes (Signed)
Patient placed on hospital bed for comfort. No complaints at this time; states that he is feeling better.

## 2017-10-15 NOTE — ED Notes (Signed)
Ordered diet tray for pt  

## 2017-10-15 NOTE — ED Notes (Signed)
Pt provided with meal tray at this time

## 2017-10-15 NOTE — ED Notes (Signed)
Pt states he is ready to go home now. Attending MD paged

## 2017-10-15 NOTE — Discharge Instructions (Signed)
Chest Wall Pain Chest wall pain is pain in or around the bones and muscles of your chest. Sometimes, an injury causes this pain. Sometimes, the cause may not be known. This pain may take several weeks or longer to get better. Follow these instructions at home: Pay attention to any changes in your symptoms. Take these actions to help with your pain:  Rest as told by your doctor.  Avoid activities that cause pain. Try not to use your chest, belly (abdominal), or side muscles to lift heavy things.  If directed, apply ice to the painful area: ? Put ice in a plastic bag. ? Place a towel between your skin and the bag. ? Leave the ice on for 20 minutes, 2-3 times per day.  Take over-the-counter and prescription medicines only as told by your doctor.  Do not use tobacco products, including cigarettes, chewing tobacco, and e-cigarettes. If you need help quitting, ask your doctor.  Keep all follow-up visits as told by your doctor. This is important.  Contact a doctor if:  You have a fever.  Your chest pain gets worse.  You have new symptoms. Get help right away if:  You feel sick to your stomach (nauseous) or you throw up (vomit).  You feel sweaty or light-headed.  You have a cough with phlegm (sputum) or you cough up blood.  You are short of breath. This information is not intended to replace advice given to you by your health care provider. Make sure you discuss any questions you have with your health care provider. Document Released: 12/07/2007 Document Revised: 11/26/2015 Document Reviewed: 09/15/2014 Elsevier Interactive Patient Education  [employer].

## 2017-10-15 NOTE — ED Notes (Signed)
Pt sitting at this bedside eating meal tray

## 2017-10-15 NOTE — ED Notes (Signed)
Patient verbalizes understanding of discharge instructions. Opportunity for questioning and answers were provided. Armband removed by staff, pt discharged from ED ambulatory with family.

## 2017-10-16 ENCOUNTER — Telehealth: Payer: Self-pay

## 2017-10-16 NOTE — Telephone Encounter (Signed)
Called to do toc call and schedule toc visit. A young boy answered the phone and said nobody was home right now. I will try again later today

## 2017-10-16 NOTE — Telephone Encounter (Signed)
Transition Care Management Follow-Up Telephone Call   Date discharged and where: [facility] on 10/15/2017   How have you been since you were released from the hospital? Still has chest pain 6/10. Headache is 8/10 pain. Is lightheaded but has had no vomiting. He feels extra fatigued-going up the stairs feels like a workout  Any patient concerns? None  Items Reviewed:   Meds: Y  Allergies: Y  Dietary Changes Reviewed: Y  Functional Questionnaire:  Independent-I Dependent-D  ADLs:   Dressing- I    Eating-I   Maintaining continence-I   Transferring-I   Transportation-D   Meal Prep-I   Managing Meds- I  Confirmed importance and Date/Time of follow-up visits scheduled: Yes, 10/27/2017 Dr. [name] @ 8:45am   Confirmed with patient if condition worsens to call PCP or go to the Emergency Dept. Patient was given office number and encouraged to call back with questions or concerns: Yes

## 2017-10-27 ENCOUNTER — Encounter: Payer: Self-pay | Admitting: Internal Medicine

## 2017-10-27 ENCOUNTER — Ambulatory Visit (INDEPENDENT_AMBULATORY_CARE_PROVIDER_SITE_OTHER): Payer: Medicare HMO | Admitting: Internal Medicine

## 2017-10-27 VITALS — BP 116/60 | HR 88 | Temp 97.4°F | Resp 10 | Ht 66.0 in | Wt 162.0 lb

## 2017-10-27 DIAGNOSIS — E785 Hyperlipidemia, unspecified: Secondary | ICD-10-CM | POA: Diagnosis not present

## 2017-10-27 DIAGNOSIS — K219 Gastro-esophageal reflux disease without esophagitis: Secondary | ICD-10-CM | POA: Diagnosis not present

## 2017-10-27 DIAGNOSIS — E1149 Type 2 diabetes mellitus with other diabetic neurological complication: Secondary | ICD-10-CM | POA: Diagnosis not present

## 2017-10-27 DIAGNOSIS — G3184 Mild cognitive impairment, so stated: Secondary | ICD-10-CM

## 2017-10-27 DIAGNOSIS — Z794 Long term (current) use of insulin: Secondary | ICD-10-CM | POA: Diagnosis not present

## 2017-10-27 DIAGNOSIS — I5022 Chronic systolic (congestive) heart failure: Secondary | ICD-10-CM | POA: Diagnosis not present

## 2017-10-27 DIAGNOSIS — R079 Chest pain, unspecified: Secondary | ICD-10-CM

## 2017-10-27 DIAGNOSIS — I10 Essential (primary) hypertension: Secondary | ICD-10-CM | POA: Diagnosis not present

## 2017-10-27 DIAGNOSIS — J019 Acute sinusitis, unspecified: Secondary | ICD-10-CM | POA: Diagnosis not present

## 2017-10-27 DIAGNOSIS — E1140 Type 2 diabetes mellitus with diabetic neuropathy, unspecified: Secondary | ICD-10-CM

## 2017-10-27 MED ORDER — METFORMIN HCL 500 MG PO TABS: 1000.0000 mg | ORAL_TABLET | Freq: Two times a day (BID) | ORAL | 6 refills | Status: DC

## 2017-10-27 MED ORDER — NITROGLYCERIN 0.4 MG SL SUBL: 0.4000 mg | SUBLINGUAL_TABLET | SUBLINGUAL | 1 refills | Status: DC | PRN

## 2017-10-27 MED ORDER — ATORVASTATIN CALCIUM 40 MG PO TABS: 40.0000 mg | ORAL_TABLET | Freq: Every day | ORAL | 11 refills | Status: DC

## 2017-10-27 MED ORDER — AMOXICILLIN 500 MG PO CAPS: 500.0000 mg | ORAL_CAPSULE | Freq: Two times a day (BID) | ORAL | 0 refills | Status: DC

## 2017-10-27 MED ORDER — OMEPRAZOLE 20 MG PO CPDR: 20.0000 mg | DELAYED_RELEASE_CAPSULE | Freq: Every day | ORAL | 6 refills | Status: DC

## 2017-10-27 MED ORDER — CARVEDILOL 3.125 MG PO TABS: 3.1250 mg | ORAL_TABLET | Freq: Two times a day (BID) | ORAL | 11 refills | Status: DC

## 2017-10-27 MED ORDER — DONEPEZIL HCL 10 MG PO TABS: 10.0000 mg | ORAL_TABLET | Freq: Every day | ORAL | 11 refills | Status: DC

## 2017-10-27 MED ORDER — GABAPENTIN 300 MG/6ML PO SOLN: 300.0000 mg | Freq: Two times a day (BID) | ORAL | 6 refills | Status: DC

## 2017-10-27 MED ORDER — LISINOPRIL 5 MG PO TABS: 5.0000 mg | ORAL_TABLET | Freq: Every day | ORAL | 11 refills | Status: DC

## 2017-10-27 NOTE — Patient Instructions (Signed)
PLEASE INJECT 26 UNITS LEVEMIR AS DIRECTED  START AMOXIL 500MG 2 TIMES DAILY FOR SINUS INFECTION  TAKE PROBIOTIC (ACTIVIA YOGURT, CULTURELLE, FLORASTER, ETC) DAILY WHILE ON AMOXIL TO KEEP COLON HEALTHY  Continue other medications as ordered  Follow up with cardiology as scheduled  Follow up in 2 mos for DM, HTN, CP, GERD, HF.

## 2017-10-27 NOTE — Progress Notes (Signed)
Patient ID: Calvin [name], male   DOB: 12/19/1960, 57 y.o.   MRN: [id number]   Location:  [facility] of Service:  OFFICE Provider: DR Arletha Grippe  Code Status:  Goals of Care:  Advanced Directives 10/15/2017  Does Patient Have a Medical Advance Directive? No  Does patient want to make changes to medical advance directive? -  Would patient like information on creating a medical advance directive? No - Patient declined     Chief Complaint  Patient presents with  . Follow-up    ER follow-up, chest pains. Patient still with chest pain off/on, headache and light-headedness   . Cough    Ongoing cough since last on on 09/26/17, ? if related to any medications   . Medication Refill    [facility], [facility]     HPI: Patient is a 57 y.o. male seen today for ED follow-up for CP. CT chest neg for dissection. He had fallen 2-3 days prior to ED presentation and struck forehead on tree stump. CT head neg for acute process but did show sinus disease. He had LHC in Feb 2019 and no significant CAD requiring surgery. EF <35% by 2D echo. He has not used prn SLNTG for CP in last week. He c/o "pulling" sensation in left chest and intermittent cough with associated itchy eyes and ear pressure. He is on ACEI x > 2 yrs. Pt also repots HA and dizziness. He is a poor historian due to short term memory loss. Hx obtained from chart. He has chills but no fever.  Past Medical History:  Diagnosis Date  . Adenoidal hypertrophy   . Adenomatous colon polyps 2012  . Arthritis   . CAD (coronary artery disease)    a. cath in 08/2017 showing mild nonobstructive CAD with scattered 20-30% stenosis.   . Diabetes mellitus   . GERD (gastroesophageal reflux disease)   . Heart attack ([place])    While living in [place]  [name] Hx of adenomatous colonic polyps 08/18/2017  . Hyperlipidemia   . Hypertension   . Nonischemic cardiomyopathy ([place])    a. EF 20-25% by echo in 09/2017 with cath showing mild CAD.   [name] Obesity     . Sleep apnea    cpap    Past Surgical History:  Procedure Laterality Date  . COLONOSCOPY  05/13/11   9 adenomas  . FOREARM SURGERY    . MUSCLE BIOPSY    . RIGHT/LEFT HEART CATH AND CORONARY ANGIOGRAPHY N/A 08/25/2017   Procedure: RIGHT/LEFT HEART CATH AND CORONARY ANGIOGRAPHY;  Surgeon: [name], MD;  Location: [facility];  Service: Cardiovascular;  Laterality: N/A;    No Known Allergies  Outpatient Encounter Medications as of 10/27/2017  Medication Sig  . aspirin EC 81 MG tablet Take 81 mg by mouth daily.  [name] atorvastatin (LIPITOR) 40 MG tablet Take 1 tablet (40 mg total) by mouth daily.  . Blood Glucose Monitoring Suppl (TRUE METRIX AIR GLUCOSE METER) w/Device KIT 1 strip 3 (three) times daily by Does not apply route. Use 1 strip to check blood sugar three (3) times daily. Dx:E11.9  . carvedilol (COREG) 3.125 MG tablet Take 1 tablet (3.125 mg total) by mouth 2 (two) times daily with a meal. For blood pressure  . docusate sodium (COLACE) 100 MG capsule Take 2 capsules (200 mg total) by mouth daily.  [name] donepezil (ARICEPT) 10 MG tablet Take 1 tablet (10 mg total) by mouth at bedtime.  . Gabapentin 300 MG/6ML  SOLN Take 300 mg by mouth 2 (two) times daily. For nerve pain  . insulin detemir (LEVEMIR) 100 UNIT/ML injection Inject 26 units subcut daily for diabetes  . Insulin Syringe-Needle U-100 (INSULIN SYRINGE .3CC/29GX1/2") 29G X 1/2" 0.3 ML MISC Inject 1 Syringe 3 (three) times daily as directed. Check blood sugar three times daily. Dx: E11.9  . lisinopril (PRINIVIL,ZESTRIL) 5 MG tablet Take 1 tablet (5 mg total) by mouth daily. For blood pressure and kidney protection  . Metformin HCl 500 MG/5ML SOLN Take 10 mLs (1,000 mg total) by mouth 2 (two) times daily. For diabetes  . nitroGLYCERIN (NITROSTAT) 0.4 MG SL tablet Place 1 tablet (0.4 mg total) under the tongue every 5 (five) minutes as needed for chest pain.   No facility-administered encounter medications on  file as of 10/27/2017.     Review of Systems:  Review of Systems  Unable to perform ROS: Other (memory loss)    Health Maintenance  Topic Date Due  . Hepatitis C Screening  1960/07/31  . OPHTHALMOLOGY EXAM  04/12/1970  . PNEUMOCOCCAL POLYSACCHARIDE VACCINE (1) 06/13/2018 (Originally 04/12/1962)  . INFLUENZA VACCINE  02/01/2018  . HEMOGLOBIN A1C  04/16/2018  . FOOT EXAM  09/27/2018  . COLONOSCOPY  08/10/2020  . TETANUS/TDAP  09/27/2025  . HIV Screening  Completed    Physical Exam: Vitals:   10/27/17 0857  BP: 116/60  Pulse: 88  Resp: 10  Temp: (!) 97.4 F (36.3 C)  TempSrc: Oral  SpO2: 98%  Weight: 162 lb (73.5 kg)  Height: 5' 6" (1.676 m)   Body mass index is 26.15 kg/m. Physical Exam  Constitutional: He appears well-developed and well-nourished.  Looks well in NAD  HENT:  TMs intact and dull b/l with no redness or bulging but fluid level noted on right; nares patent with red dry turbinates; R>L maxillary sinus TTP with boggy tissue texture changes; oropharynx cobblestoning and redness but no exudate  Eyes: Pupils are equal, round, and reactive to light. No scleral icterus.  Neck: Neck supple. Muscular tenderness present. Carotid bruit is not present. Decreased range of motion present. No thyromegaly present.  Cardiovascular: Normal rate, regular rhythm and intact distal pulses. Exam reveals no gallop and no friction rub.  Murmur heard.  Systolic murmur is present with a grade of 1/6. no distal LE swelling. No calf TTP  Pulmonary/Chest: Effort normal and breath sounds normal. He has no wheezes. He has no rales. He exhibits no tenderness.  Abdominal: Soft. Bowel sounds are normal. He exhibits distension. He exhibits no abdominal bruit, no pulsatile midline mass and no mass. There is no hepatomegaly. There is tenderness (epigastric). There is no rebound and no guarding.  Musculoskeletal: He exhibits edema.  Lymphadenopathy:    He has cervical adenopathy (L>R proximal,  TTP).  Neurological: He is alert. He has normal reflexes.  Skin: Skin is warm and dry. No rash noted.  Psychiatric: He has a normal mood and affect. His behavior is normal. Thought content normal.    Labs reviewed: Basic Metabolic Panel: Recent Labs    05/10/17 1238  09/22/17 0954 10/14/17 1444 10/15/17 0754  NA 135   < > 135 136 136  K 4.3   < > 4.4 4.1 4.1  CL 98   < > 101 102 105  CO2 28   < > _0 GLUCOSE 313*   < > 264* 288* 257*  BUN 11   < > _1 CREATININE 1.27   < >  1.13 1.12 1.03  CALCIUM 9.5   < > 9.4 9.3 8.5*  MG  --   --   --   --  1.9  PHOS  --   --   --   --  2.9  TSH 1.30  --   --   --  1.362   < > = values in this interval not displayed.   Liver Function Tests: Recent Labs    11/18/16 1025 05/10/17 1238 09/22/17 0954 10/15/17 0754  AST _0 ALT _1 14*  ALKPHOS 80  --   --  60  BILITOT 0.7 0.6 0.6 0.5  PROT 6.4* 6.8 6.5 5.8*  ALBUMIN 3.6  --   --  3.3*   No results for input(s): LIPASE, AMYLASE in the last 8760 hours. No results for input(s): AMMONIA in the last 8760 hours. CBC: Recent Labs    11/18/16 1025  05/10/17 1238 08/16/17 1037 10/14/17 1444 10/15/17 0754  WBC 5.0   < > 5.7 5.6 5.9 6.7  NEUTROABS 2.8  --  3,164  --   --   --   HGB 15.3   < > 16.5 15.6 16.7 15.0  HCT 44.0   < > 48.2 44.7 47.4 43.2  MCV 88.5   < > 89.1 89 88.8 87.4  PLT 183   < > 232 226 214 196   < > = values in this interval not displayed.   Lipid Panel: Recent Labs    05/10/17 1238 09/22/17 0954 10/15/17 0754  CHOL 250* 208* 219*  HDL 60 53 49  LDLCALC 158* 135* 154*  TRIG 172* 96 79  CHOLHDL 4.2 3.9 4.5   Lab Results  Component Value Date   HGBA1C 10.3 (H) 10/15/2017    Procedures since last visit: Dg Chest 2 View  Result Date: 10/14/2017 CLINICAL DATA:  Left-sided chest pain with radiation to left arm. Shortness of breath. Dizziness. Previous myocardial infarct. EXAM: CHEST - 2 VIEW COMPARISON:  03/18/2017 FINDINGS: The  heart size and mediastinal contours are within normal limits. Stable prominence of epicardial fat pad in the right cardiophrenic angle. Both lungs are clear. The visualized skeletal structures are unremarkable. IMPRESSION: Stable exam.  No active cardiopulmonary disease. Electronically Signed   By: Earle Gell M.D.   On: 10/14/2017 15:15   Ct Head Wo Contrast  Result Date: 10/14/2017 CLINICAL DATA:  Headache and dizziness. EXAM: CT HEAD WITHOUT CONTRAST TECHNIQUE: Contiguous axial images were obtained from the base of the skull through the vertex without intravenous contrast. COMPARISON:  Head CT February 12, 2010 FINDINGS: Brain: The ventricles are normal in size and configuration. There is no intracranial mass, hemorrhage, extra-axial fluid collection, or midline shift. The gray-white compartments appear normal. No evident acute infarct. Vascular: No hyperdense vessel. There is mild calcification in each carotid siphon. Skull: The bony calvarium appears intact. Sinuses/Orbits: There is mucosal thickening in several ethmoid air cells. There is a retention cyst in the superior posterior right maxillary antrum. Other visualized paranasal sinuses are clear. Orbits appear symmetric bilaterally. Other: Mastoids on the left are clear. Mastoids on the right are nearly aplastic. IMPRESSION: No intracranial mass or hemorrhage. Gray-white compartments appear normal. There are foci of arterial vascular calcification. There are foci of paranasal sinus disease. Mastoids on the right are nearly aplastic. Electronically Signed   By: Lowella Grip III M.D.   On: 10/14/2017 20:23   Mr Jodene Nam Head Wo Contrast  Result Date: 10/15/2017 CLINICAL  DATA:  Struck face on table yesterday, headache. Gait imbalance, LEFT body numbness for 5 days. Suspect stroke. History of hypertension, hyperlipidemia, diabetes. EXAM: MRI HEAD WITHOUT CONTRAST MRA HEAD WITHOUT CONTRAST TECHNIQUE: Multiplanar, multiecho pulse sequences of the brain and  surrounding structures were obtained without intravenous contrast. Angiographic images of the head were obtained using MRA technique without contrast. COMPARISON:  CT HEAD October 14, 2017 and MRA of the head June 17, 2006. FINDINGS: MRI HEAD FINDINGS INTRACRANIAL CONTENTS: No reduced diffusion to suggest acute ischemia. Chronic microhemorrhage LEFT cerebellum. The ventricles and sulci are normal for patient's age. Faint supratentorial pontine white matter FLAIR T2 hyperintensities compatible with minimal chronic small vessel ischemic disease. No suspicious parenchymal signal, masses, mass effect. No abnormal extra-axial fluid collections. No extra-axial masses. VASCULAR: Normal major intracranial vascular flow voids present at skull base. SKULL AND UPPER CERVICAL SPINE: No abnormal sellar expansion. No suspicious calvarial bone marrow signal. Craniocervical junction maintained. SINUSES/ORBITS: RIGHT maxillary mucosal retention cyst. Under pneumatized RIGHT mastoid air cells with trace fluid signal.The included ocular globes and orbital contents are non-suspicious. OTHER: None. MRA HEAD FINDINGS ANTERIOR CIRCULATION: Normal flow related enhancement of the included cervical, petrous, cavernous and supraclinoid internal carotid arteries. Patent anterior communicating artery. Patent anterior and middle cerebral arteries, including distal segments. No large vessel occlusion, flow limiting stenosis, aneurysm. POSTERIOR CIRCULATION: LEFT vertebral artery is dominant. Basilar artery is patent, with normal flow related enhancement of the main branch vessels. Patent posterior cerebral arteries. RIGHT communicating artery present. No large vessel occlusion, flow limiting stenosis,  aneurysm. ANATOMIC VARIANTS: None. Source images and MIP images were reviewed. IMPRESSION: MRI HEAD: 1. No acute intracranial process. 2. Minimal chronic small vessel ischemic changes. MRA HEAD: 1. Normal noncontrast MRA head. Electronically  Signed   By: Elon Alas M.D.   On: 10/15/2017 00:55   Mr Brain Wo Contrast  Result Date: 10/15/2017 CLINICAL DATA:  Struck face on table yesterday, headache. Gait imbalance, LEFT body numbness for 5 days. Suspect stroke. History of hypertension, hyperlipidemia, diabetes. EXAM: MRI HEAD WITHOUT CONTRAST MRA HEAD WITHOUT CONTRAST TECHNIQUE: Multiplanar, multiecho pulse sequences of the brain and surrounding structures were obtained without intravenous contrast. Angiographic images of the head were obtained using MRA technique without contrast. COMPARISON:  CT HEAD October 14, 2017 and MRA of the head June 17, 2006. FINDINGS: MRI HEAD FINDINGS INTRACRANIAL CONTENTS: No reduced diffusion to suggest acute ischemia. Chronic microhemorrhage LEFT cerebellum. The ventricles and sulci are normal for patient's age. Faint supratentorial pontine white matter FLAIR T2 hyperintensities compatible with minimal chronic small vessel ischemic disease. No suspicious parenchymal signal, masses, mass effect. No abnormal extra-axial fluid collections. No extra-axial masses. VASCULAR: Normal major intracranial vascular flow voids present at skull base. SKULL AND UPPER CERVICAL SPINE: No abnormal sellar expansion. No suspicious calvarial bone marrow signal. Craniocervical junction maintained. SINUSES/ORBITS: RIGHT maxillary mucosal retention cyst. Under pneumatized RIGHT mastoid air cells with trace fluid signal.The included ocular globes and orbital contents are non-suspicious. OTHER: None. MRA HEAD FINDINGS ANTERIOR CIRCULATION: Normal flow related enhancement of the included cervical, petrous, cavernous and supraclinoid internal carotid arteries. Patent anterior communicating artery. Patent anterior and middle cerebral arteries, including distal segments. No large vessel occlusion, flow limiting stenosis, aneurysm. POSTERIOR CIRCULATION: LEFT vertebral artery is dominant. Basilar artery is patent, with normal flow related  enhancement of the main branch vessels. Patent posterior cerebral arteries. RIGHT communicating artery present. No large vessel occlusion, flow limiting stenosis,  aneurysm. ANATOMIC VARIANTS: None. Source images and MIP images  were reviewed. IMPRESSION: MRI HEAD: 1. No acute intracranial process. 2. Minimal chronic small vessel ischemic changes. MRA HEAD: 1. Normal noncontrast MRA head. Electronically Signed   By: Elon Alas M.D.   On: 10/15/2017 00:55   Ct Angio Chest/abd/pel For Dissection W And/or W/wo  Result Date: 10/14/2017 CLINICAL DATA:  Left-sided chest pain radiating to the left arm with dizziness and dyspnea. History of myocardial infarction and diabetes. EXAM: CT ANGIOGRAPHY CHEST, ABDOMEN AND PELVIS TECHNIQUE: Multidetector CT imaging through the chest, abdomen and pelvis was performed using the standard protocol during bolus administration of intravenous contrast. Multiplanar reconstructed images and MIPs were obtained and reviewed to evaluate the vascular anatomy. CONTRAST:  163m ISOVUE-370 IOPAMIDOL (ISOVUE-370) INJECTION 76% COMPARISON:  CXR 10/14/2017, CT chest, abdomen and pelvis from 12/20/2011 FINDINGS: CTA CHEST FINDINGS Cardiovascular: Normal branch pattern of the great vessels without stenosis, dissection or aneurysm. Mild ectasia of the thoracic aorta without aneurysm or dissection. Minimal aortic atherosclerosis at the arch and descending aorta. Borderline stable cardiomegaly without pericardial effusion. No acute pulmonary embolus. Minimal coronary arteriosclerosis suggested along the RCA. Mediastinum/Nodes: No enlarged mediastinal, hilar, or axillary lymph nodes. Thyroid gland, trachea, and esophagus demonstrate no significant findings. Lungs/Pleura: Lungs are clear given motion degraded artifacts. No pleural effusion or pneumothorax. Musculoskeletal: No chest wall abnormality. No acute or significant osseous findings. Review of the MIP images confirms the above findings. CTA  ABDOMEN AND PELVIS FINDINGS VASCULAR Aorta: Normal caliber aorta without aneurysm, dissection, vasculitis or significant stenosis. Celiac: Patent without evidence of aneurysm, dissection, vasculitis or significant stenosis. SMA: Patent without evidence of aneurysm, dissection, vasculitis or significant stenosis. Renals: Both renal arteries are patent without evidence of aneurysm, dissection, vasculitis, fibromuscular dysplasia or significant stenosis. IMA: Patent without evidence of aneurysm, dissection, vasculitis or significant stenosis. Inflow: Mild calcific atherosclerosis of the common iliac arteries without significant luminal narrowing. Atherosclerosis of both internal carotid arteries and branch vessels of without stenosis. Veins: No obvious venous abnormality within the limitations of this arterial phase study. Review of the MIP images confirms the above findings. NON-VASCULAR Hepatobiliary: No space-occupying mass of the liver. There is no biliary dilatation. The gallbladder is unremarkable. Pancreas: Normal without enhancing mass or inflammation. Stable 4 mm pancreatic ductal dilatation near the ampulla, unchanged. No ductal stone. Spleen: Normal size spleen. Heterogeneous enhancement likely due to timing of contrast bolus. Adrenals/Urinary Tract: Stable hyperdense nodule in the anterior limb of the left adrenal gland measuring 1 cm, stable in appearance from 2013 consistent with a benign finding. Symmetric cortical enhancement of both kidneys without renal mass, nephrolithiasis nor obstructive uropathy. Unremarkable bladder for the degree of distention. Stomach/Bowel: Normal appendix. Nondistended stomach with unremarkable appearance of the small intestine. No large bowel obstruction or inflammation. Lymphatic: No lymphadenopathy by CT size criteria. Reproductive: Enlarged prostate measuring 5.6 x 4.1 x 4.7 cm. Other: No free air nor free fluid. Musculoskeletal: No acute or significant osseous findings.  Review of the MIP images confirms the above findings. IMPRESSION: 1. No thoracic nor abdominal aortic aneurysm or dissection. 2. No acute solid nor hollow visceral organ abnormality. 3. Minimal right coronary arteriosclerosis suggested. 4. Stable 1 cm hyperdense left adrenal nodule dating back to 2013 unchanged in size and appearance compatible with a benign finding. Electronically Signed   By: DAshley RoyaltyM.D.   On: 10/14/2017 20:35    Assessment/Plan   ICD-10-CM   1. Acute sinusitis, recurrence not specified, unspecified location J01.90 amoxicillin (AMOXIL) 500 MG capsule  2. MCI (mild cognitive impairment)  G31.84 donepezil (ARICEPT) 10 MG tablet  3. Other diabetic neurological complication associated with type 2 diabetes mellitus ([place]) E11.49   4. Type 2 diabetes mellitus with diabetic neuropathy, with long-term current use of insulin (HCC) E11.40 Gabapentin 300 MG/6ML SOLN   Z79.4 metFORMIN (GLUCOPHAGE) 500 MG tablet  5. Gastroesophageal reflux disease, esophagitis presence not specified K21.9 omeprazole (PRILOSEC) 20 MG capsule  6. Essential hypertension I10 lisinopril (PRINIVIL,ZESTRIL) 5 MG tablet  7. Chronic systolic heart failure (HCC) I50.22 carvedilol (COREG) 3.125 MG tablet  8. Hyperlipidemia, unspecified hyperlipidemia type E78.5 atorvastatin (LIPITOR) 40 MG tablet  9. Chest pain, unspecified type R07.9 nitroGLYCERIN (NITROSTAT) 0.4 MG SL tablet   PLEASE INJECT 26 UNITS LEVEMIR AS DIRECTED  START AMOXIL 500MG 2 TIMES DAILY FOR SINUS INFECTION  TAKE PROBIOTIC (ACTIVIA YOGURT, CULTURELLE, FLORASTER, ETC) DAILY WHILE ON AMOXIL TO KEEP COLON HEALTHY  Continue other medications as ordered  Follow up with cardiology as scheduled  Follow up in 2 mos for DM, HTN, CP, GERD, HF.    S. Perlie Gold  [facility] [address] [place], [place] [address] [phone] Cell (Monday-Friday 8 AM - 5 PM) [phone] After 5 PM and  follow prompts

## 2017-11-10 ENCOUNTER — Other Ambulatory Visit: Payer: Medicare HMO | Admitting: *Deleted

## 2017-11-10 DIAGNOSIS — E785 Hyperlipidemia, unspecified: Secondary | ICD-10-CM | POA: Diagnosis not present

## 2017-11-10 LAB — HEPATIC FUNCTION PANEL
ALT: 14 IU/L (ref 0–44)
AST: 14 IU/L (ref 0–40)
Albumin: 3.8 g/dL (ref 3.5–5.5)
Alkaline Phosphatase: 76 IU/L (ref 39–117)
Bilirubin Total: 0.4 mg/dL (ref 0.0–1.2)
Bilirubin, Direct: 0.11 mg/dL (ref 0.00–0.40)
Total Protein: 6.2 g/dL (ref 6.0–8.5)

## 2017-11-10 LAB — LIPID PANEL
Chol/HDL Ratio: 4 ratio (ref 0.0–5.0)
Cholesterol, Total: 199 mg/dL (ref 100–199)
HDL: 50 mg/dL (ref >39)
LDL Calculated: 135 mg/dL — ABNORMAL HIGH (ref 0–99)
Triglycerides: 70 mg/dL (ref 0–149)
VLDL Cholesterol Cal: 14 mg/dL (ref 5–40)

## 2017-11-15 ENCOUNTER — Ambulatory Visit: Payer: Medicare HMO | Admitting: Cardiology

## 2017-11-15 ENCOUNTER — Encounter: Payer: Self-pay | Admitting: Cardiology

## 2017-11-15 VITALS — BP 116/72 | HR 84 | Ht 66.0 in | Wt 163.0 lb

## 2017-11-15 DIAGNOSIS — I5022 Chronic systolic (congestive) heart failure: Secondary | ICD-10-CM

## 2017-11-15 NOTE — Patient Instructions (Addendum)
Medication Instructions:   Your physician recommends that you continue on your current medications as directed. Please refer to the Current Medication list given to you today.    If you need a refill on your cardiac medications before your next appointment, please call your pharmacy.  Labwork:  NONE ORDERED  TODAY   Testing/Procedures:  IN ONE MONTH Your physician has requested that you have an echocardiogram. Echocardiography is a painless test that uses sound waves to create images of your heart. It provides your doctor with information about the size and shape of your heart and how well your heart's chambers and valves are working. This procedure takes approximately one hour. There are no restrictions for this procedure.   Follow-Up: 1 TO 2 WEEKS AFTER ECHO  WITH [facility] OR [facility]     Any Other Special Instructions Will Be Listed Below (If Applicable).  PLEASE MAKE SURE YOUR NEXT  OFFICE VISIT YOU BRING ALL YOUR MEDICATION BOTTLES IN A BAG

## 2017-11-15 NOTE — Progress Notes (Signed)
11/15/2017 Calvin [name]   October 02, 1960  [id number]  Primary Physician [name], DO Primary Cardiologist: Dr. Angelena Form  Reason for Visit/CC: Chronic systolic heart failure/nonischemic cardiomyopathy  HPI:  Calvin [name] is a 57 y.o. 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  This is a new diagnosis made earlier this year in February.  Cardiac catheterization was performed by Dr. Angelena Form on August 25, 2017 for exertional dyspnea and he was found to have mild nonobstructive coronary artery disease with 30% mid to distal RCA, 20% proximal to distal left circumflex and 30% mid LAD.  Echocardiogram showed severe LV dysfunction with an ejection fraction of 20 to 25%.  Filling pressures were normal.  Medical management for CAD and his cardiomyopathy were recommended.  Additional past medical history also includes poorly controlled type 2 diabetes and hyperlipidemia.  Since the diagnoses of systolic heart failure, we have been attempting to gradually titrate his heart failure regimen.  However, he has only been able to tolerate low-dose carvedilol and lisinopril due to borderline hypotension and dizziness.  He is currently on 3.125 mg of carvedilol twice daily and 5 mg of lisinopril once daily.  At his last office visit in March, we were unable to titrate meds further due to BP.  He was seen again by our practice in the hospital in April 2019 for atypical chest pain and his blood pressure was also noted to be soft at that time, limiting further titration.  He is back again today for follow-up.  His blood pressure before taking any medications this morning is 116/68.  He tells me that he often feels dizzy and fatigue but he denies syncope/near syncope.  He denies any resting dyspnea.  His exercise tolerance is "ok".  Does get short of breath if he tries to walk a long distance but does fairly well with basic ADLs.  No  weight gain or lower extremity edema.  Current Meds  Medication Sig  . amoxicillin (AMOXIL) 500 MG capsule Take 1 capsule (500 mg total) by mouth 2 (two) times daily. For sinus infection  . aspirin EC 81 MG tablet Take 81 mg by mouth daily.  [name] atorvastatin (LIPITOR) 40 MG tablet Take 1 tablet (40 mg total) by mouth daily.  . Blood Glucose Monitoring Suppl (TRUE METRIX AIR GLUCOSE METER) w/Device KIT 1 strip 3 (three) times daily by Does not apply route. Use 1 strip to check blood sugar three (3) times daily. Dx:E11.9  . carvedilol (COREG) 3.125 MG tablet Take 1 tablet (3.125 mg total) by mouth 2 (two) times daily with a meal. For blood pressure  . docusate sodium (COLACE) 100 MG capsule Take 2 capsules (200 mg total) by mouth daily.  [name] donepezil (ARICEPT) 10 MG tablet Take 1 tablet (10 mg total) by mouth at bedtime.  . Gabapentin 300 MG/6ML SOLN Take 300 mg by mouth 2 (two) times daily. For nerve pain  . insulin detemir (LEVEMIR) 100 UNIT/ML injection Inject 26 units subcut daily for diabetes  . Insulin Syringe-Needle U-100 (INSULIN SYRINGE .3CC/29GX1/2") 29G X 1/2" 0.3 ML MISC Inject 1 Syringe 3 (three) times daily as directed. Check blood sugar three times daily. Dx: E11.9  . lisinopril (PRINIVIL,ZESTRIL) 5 MG tablet Take 1 tablet (5 mg total) by mouth daily. For blood pressure and kidney protection  . metFORMIN (GLUCOPHAGE) 500 MG tablet Take 2 tablets (1,000 mg total) by mouth 2 (two) times daily with a meal.  . nitroGLYCERIN (NITROSTAT) 0.4  MG SL tablet Place 1 tablet (0.4 mg total) under the tongue every 5 (five) minutes as needed for chest pain.  [name] omeprazole (PRILOSEC) 20 MG capsule Take 1 capsule (20 mg total) by mouth daily.   No Known Allergies Past Medical History:  Diagnosis Date  . Adenoidal hypertrophy   . Adenomatous colon polyps 2012  . Arthritis   . CAD (coronary artery disease)    a. cath in 08/2017 showing mild nonobstructive CAD with scattered 20-30% stenosis.   .  Diabetes mellitus   . GERD (gastroesophageal reflux disease)   . Heart attack ([place])    [place] in [place]  [name] Hx of adenomatous colonic polyps 08/18/2017  . Hyperlipidemia   . Hypertension   . Nonischemic cardiomyopathy ([place])    a. EF 20-25% by echo in 09/2017 with cath showing mild CAD.   [name] Obesity   . Sleep apnea    cpap   Family History  Problem Relation Age of Onset  . Heart disease Mother   . Heart attack Mother 7  . Hypertension Mother   . Hyperlipidemia Mother   . Heart disease Father   . Heart attack Father 55  . Hypertension Father   . Hyperlipidemia Father   . Heart attack Sister 37  . Colon cancer Neg Hx   . Stomach cancer Neg Hx   . Esophageal cancer Neg Hx   . Rectal cancer Neg Hx    Past Surgical History:  Procedure Laterality Date  . COLONOSCOPY  05/13/11   9 adenomas  . FOREARM SURGERY    . MUSCLE BIOPSY    . RIGHT/LEFT HEART CATH AND CORONARY ANGIOGRAPHY N/A 08/25/2017   Procedure: RIGHT/LEFT HEART CATH AND CORONARY ANGIOGRAPHY;  Surgeon: [name], MD;  Location: [facility];  Service: Cardiovascular;  Laterality: N/A;   Social History   Socioeconomic History  . Marital status: Single    Spouse name: Not on file  . Number of children: 0  . Years of education: Not on file  . Highest education level: Not on file  Occupational History  . Occupation: Disability  Social Needs  . Financial resource strain: Not on file  . Food insecurity:    Worry: Not on file    Inability: Not on file  . Transportation needs:    Medical: Not on file    Non-medical: Not on file  Tobacco Use  . Smoking status: Never Smoker  . Smokeless tobacco: Never Used  Substance and Sexual Activity  . Alcohol use: No  . Drug use: No  . Sexual activity: Yes    Birth control/protection: None  Lifestyle  . Physical activity:    Days per week: Not on file    Minutes per session: Not on file  . Stress: Not on file  Relationships  . Social connections:      Talks on phone: Not on file    Gets together: Not on file    Attends religious service: Not on file    Active member of club or organization: Not on file    Attends meetings of clubs or organizations: Not on file    Relationship status: Not on file  . Intimate partner violence:    Fear of current or ex partner: Not on file    Emotionally abused: Not on file    Physically abused: Not on file    Forced sexual activity: Not on file  Other Topics Concern  . Not on file  Social History Narrative   Social History      Diet?       Do you drink/eat things with caffeine? yes      Marital status?       single                             What year were you married?      Do you live in a house, apartment, assisted living, condo, trailer, etc.? yes      Is it one or more stories? One story      How many persons live in your home?      Do you have any pets in your home? (please list) none      Highest level of education completed? graduate      Current or past profession:      Do you exercise?            no                          Type & how often?      Advanced Directives      Do you have a living will? no      Do you have a DNR form?                                  If not, do you want to discuss one? no      Do you have signed POA/HPOA for forms? no      Functional Status      Do you have difficulty bathing or dressing yourself? no      Do you have difficulty preparing food or eating? no      Do you have difficulty managing your medications? no      Do you have difficulty managing your finances? no      Do you have difficulty affording your medications? no     Review of Systems: General: negative for chills, fever, night sweats or weight changes.  Cardiovascular: negative for chest pain, dyspnea on exertion, edema, orthopnea, palpitations, paroxysmal nocturnal dyspnea or shortness of breath Dermatological: negative for rash Respiratory: negative for cough or  wheezing Urologic: negative for hematuria Abdominal: negative for nausea, vomiting, diarrhea, bright red blood per rectum, melena, or hematemesis Neurologic: negative for visual changes, syncope, or dizziness All other systems reviewed and are otherwise negative except as noted above.   Physical Exam:  Blood pressure 116/72, pulse 84, height _0  (1.676 m), weight 163 lb (73.9 kg), SpO2 97 %.  General appearance: alert, cooperative and no distress Neck: no carotid bruit and no JVD Lungs: clear to auscultation bilaterally Abdomen: soft, non-tender; bowel sounds normal; no masses,  no organomegaly Extremities: extremities normal, atraumatic, no cyanosis or edema Pulses: 2+ and symmetric Skin: Skin color, texture, turgor normal. No rashes or lesions Neurologic: Grossly normal  EKG not performed -- personally reviewed   ASSESSMENT AND PLAN:   1. Chronic systolic heart failure/nonischemic cardiomyopathy: EF 20 to 25% by echocardiogram September 04, 2017.  Cardiac catheterization revealed mild nonobstructive CAD.  He is stable from a volume standpoint but continues to have fatigue.  We have made efforts over the last several months to titrate his heart failure meds, based on guidelines.  However, this has been limited by soft  blood pressure and dizziness.  He has only been able to tolerate low-dose carvedilol and lisinopril.  He is on 3.125 mg of carvedilol twice daily along with 5 mg of lisinopril.  His blood pressure today in clinic is 116/68 before medications.  He reports frequent dizziness and fatigue but denies syncope/near syncope.  We will plan to repeat echocardiogram next month for repeat assessment.  If EF remains less than 35% we will refer to EP for consideration for ICD.   2.  CAD: cardiac catheterization August 25, 2017 showed 30% mid to distal RCA, 20% proximal to distal circumflex and 30% mid LAD disease.  Continue medical therapy and aggressive risk factor modification.  Continue  aspirin, high-dose statin therapy, beta-blocker and ACE inhibitor along with better control of his diabetes and hyperlipidemia  3.  Type 2 diabetes: poorly controlled.  Hemoglobin A1c remains elevated but is improved down from 11.8>>10.3.  He is on insulin and metformin, closely followed by his PCP.  He reports that he is compliant with yearly eye exams and also does routine foot checks at home.  4.  Hyperlipidemia: poorly controlled.  He has mild nonobstructive coronary artery disease and goal LDL is less than 70 mg/dL.  His statin was recently changed from lovastatin to Lipitor 40 mg however repeat fasting lipid panel on Nov 10, 2017 showed elevated LDL at 135 mg/dL.  When questioned about his compliance with Lipitor, the patient stated that he is unsure if he is taking this or not.  He plans to check his medications when he returns home and will bring all of his home medications with him at his next office visit for review.  Daily compliance with statin therapy was stressed.   Follow-Up in 1 month after echocardiogram  Calvin [name], M[facility] 11/15/2017 10:05 AM

## 2017-12-18 ENCOUNTER — Ambulatory Visit (HOSPITAL_COMMUNITY): Payer: Medicare HMO | Attending: Cardiology

## 2017-12-18 ENCOUNTER — Other Ambulatory Visit: Payer: Self-pay

## 2017-12-18 DIAGNOSIS — G4733 Obstructive sleep apnea (adult) (pediatric): Secondary | ICD-10-CM | POA: Diagnosis not present

## 2017-12-18 DIAGNOSIS — E119 Type 2 diabetes mellitus without complications: Secondary | ICD-10-CM | POA: Insufficient documentation

## 2017-12-18 DIAGNOSIS — E785 Hyperlipidemia, unspecified: Secondary | ICD-10-CM | POA: Insufficient documentation

## 2017-12-18 DIAGNOSIS — I5022 Chronic systolic (congestive) heart failure: Secondary | ICD-10-CM | POA: Insufficient documentation

## 2017-12-27 ENCOUNTER — Ambulatory Visit: Payer: Medicare HMO | Admitting: Internal Medicine

## 2017-12-28 ENCOUNTER — Encounter (HOSPITAL_COMMUNITY): Payer: Self-pay | Admitting: Emergency Medicine

## 2017-12-28 ENCOUNTER — Other Ambulatory Visit: Payer: Self-pay

## 2017-12-28 ENCOUNTER — Emergency Department (HOSPITAL_COMMUNITY)
Admission: EM | Admit: 2017-12-28 | Discharge: 2017-12-28 | Disposition: A | Discharge status: Home / Self Care | Payer: Medicare HMO | Attending: Emergency Medicine | Admitting: Emergency Medicine

## 2017-12-28 DIAGNOSIS — L299 Pruritus, unspecified: Secondary | ICD-10-CM | POA: Diagnosis not present

## 2017-12-28 DIAGNOSIS — Z7982 Long term (current) use of aspirin: Secondary | ICD-10-CM | POA: Insufficient documentation

## 2017-12-28 DIAGNOSIS — Z794 Long term (current) use of insulin: Secondary | ICD-10-CM | POA: Insufficient documentation

## 2017-12-28 DIAGNOSIS — I2510 Atherosclerotic heart disease of native coronary artery without angina pectoris: Secondary | ICD-10-CM | POA: Diagnosis not present

## 2017-12-28 DIAGNOSIS — E1140 Type 2 diabetes mellitus with diabetic neuropathy, unspecified: Secondary | ICD-10-CM | POA: Diagnosis not present

## 2017-12-28 DIAGNOSIS — Z79899 Other long term (current) drug therapy: Secondary | ICD-10-CM | POA: Insufficient documentation

## 2017-12-28 DIAGNOSIS — I110 Hypertensive heart disease with heart failure: Secondary | ICD-10-CM | POA: Insufficient documentation

## 2017-12-28 DIAGNOSIS — I5022 Chronic systolic (congestive) heart failure: Secondary | ICD-10-CM | POA: Insufficient documentation

## 2017-12-28 DIAGNOSIS — R21 Rash and other nonspecific skin eruption: Secondary | ICD-10-CM | POA: Insufficient documentation

## 2017-12-28 MED ORDER — CETIRIZINE HCL 10 MG PO TABS: 10.0000 mg | ORAL_TABLET | Freq: Every day | ORAL | 0 refills | Status: DC

## 2017-12-28 MED ORDER — RANITIDINE HCL 150 MG PO CAPS: 150.0000 mg | ORAL_CAPSULE | Freq: Every day | ORAL | 0 refills | Status: DC

## 2017-12-28 MED ORDER — TRIAMCINOLONE ACETONIDE 0.5 % EX CREA
TOPICAL_CREAM | Freq: Two times a day (BID) | CUTANEOUS | Status: DC
  Administered 2017-12-28: 15:00:00 via TOPICAL
  Filled 2017-12-28: qty 15

## 2017-12-28 NOTE — ED Notes (Signed)
Patient made aware of delay for cream from pharmacy for d/c

## 2017-12-28 NOTE — ED Notes (Signed)
Delay in triage due to patient leaving department to get his phone from his car prior to triage.

## 2017-12-28 NOTE — ED Notes (Signed)
Patient able to ambulate independently  

## 2017-12-28 NOTE — Discharge Instructions (Addendum)
Take Zyrtec and zantac as prescribed.  Use Kenalog cream as needed twice a day for itching. Do not use on the face.  Follow up with your primary care doctor if your symptoms are not improving.  Return to the ER if you develop fevers, skin peeling, or any new or concerning symptoms.

## 2017-12-28 NOTE — ED Provider Notes (Signed)
[facility] EMERGENCY DEPARTMENT Provider Note   CSN: [id number] Arrival date & time: 12/28/17  1311     History   Chief Complaint Chief Complaint  Patient presents with  . Rash    HPI Calvin [name] is a 57 y.o. male presenting for evaluation of rash on bilateral forearms.  Patient states that a month ago, he noticed a similar rash which is present for several days, then resolved.  4 days ago, rash returned.  It is on his forearms, no extension to his upper arms, head, face, or torso.  He reports some itching of his knees bilaterally, this is new.  Patient states rash is pruritic.  He scratches at it until it is excoriated and create scabs.  He dates that prior to the initial rash, he was outside doing yard work, may have come in contact with poison ivy.  He states that he is often outside, but no known contact.  He denies tick exposure.  He denies fevers, chills, cough, chest pain, shortness of breath, nausea, vomiting, abdominal pain.  He has a history of diabetes, which is poorly controlled.  He states his blood sugars normally 400-500.  No one else at home with similar rash.  He denies new environments, exposures, soaps, detergents, shampoos.  He has been using Benadryl with improvement of the itching, but no improvement of rash.  HPI  Past Medical History:  Diagnosis Date  . Adenoidal hypertrophy   . Adenomatous colon polyps 2012  . Arthritis   . CAD (coronary artery disease)    a. cath in 08/2017 showing mild nonobstructive CAD with scattered 20-30% stenosis.   . Diabetes mellitus   . GERD (gastroesophageal reflux disease)   . Heart attack ([place])    [place]  [name] Hx of adenomatous colonic polyps 08/18/2017  . Hyperlipidemia   . Hypertension   . Nonischemic cardiomyopathy ([place])    a. EF 20-25% by echo in 09/2017 with cath showing mild CAD.   [name] Obesity   . Sleep apnea    cpap    Patient Active Problem List   Diagnosis Date Noted  .  Diabetic neuropathy ([place]) 09/26/2017  . MCI (mild cognitive impairment) 06/23/2017  . OSA on CPAP 05/10/2017  . Gastroesophageal reflux disease 05/10/2017  . Insomnia 05/10/2017  . Onychomycosis of multiple toenails with type 2 diabetes mellitus and peripheral neuropathy ([place]) 05/10/2017  . Chronic systolic heart failure ([place]) 11/22/2015  . Essential hypertension 11/22/2015  . DM (diabetes mellitus) ([place]) 11/20/2015  . HLD (hyperlipidemia) 11/20/2015  . Hx of adenomatous colonic polyps 05/19/2011    Past Surgical History:  Procedure Laterality Date  . COLONOSCOPY  05/13/11   9 adenomas  . FOREARM SURGERY    . MUSCLE BIOPSY    . RIGHT/LEFT HEART CATH AND CORONARY ANGIOGRAPHY N/A 08/25/2017   Procedure: RIGHT/LEFT HEART CATH AND CORONARY ANGIOGRAPHY;  Surgeon: [name], MD;  Location: [facility];  Service: Cardiovascular;  Laterality: N/A;        Home Medications    Prior to Admission medications   Medication Sig Start Date End Date Taking? Authorizing Provider  amoxicillin (AMOXIL) 500 MG capsule Take 1 capsule (500 mg total) by mouth 2 (two) times daily. For sinus infection 10/27/17   [name], DO  aspirin EC 81 MG tablet Take 81 mg by mouth daily.    [provider]  atorvastatin (LIPITOR) 40 MG tablet Take 1 tablet (40 mg total) by mouth  daily. 10/27/17 01/25/18  [name], DO  Blood Glucose Monitoring Suppl (TRUE METRIX AIR GLUCOSE METER) w/Device KIT 1 strip 3 (three) times daily by Does not apply route. Use 1 strip to check blood sugar three (3) times daily. Dx:E11.9 05/10/17   [name], DO  carvedilol (COREG) 3.125 MG tablet Take 1 tablet (3.125 mg total) by mouth 2 (two) times daily with a meal. For blood pressure 10/27/17   [name], DO  cetirizine (ZYRTEC) 10 MG tablet Take 1 tablet (10 mg total) by mouth daily. 12/28/17   Sostenes Kauffmann, [name]  docusate sodium (COLACE) 100 MG capsule Take 2 capsules (200 mg total) by mouth  daily. 07/25/17   [name], DO  donepezil (ARICEPT) 10 MG tablet Take 1 tablet (10 mg total) by mouth at bedtime. 10/27/17   [name], DO  Gabapentin 300 MG/6ML SOLN Take 300 mg by mouth 2 (two) times daily. For nerve pain 10/27/17   [name], DO  insulin detemir (LEVEMIR) 100 UNIT/ML injection Inject 26 units subcut daily for diabetes 07/25/17   [name], DO  Insulin Syringe-Needle U-100 (INSULIN SYRINGE .3CC/29GX1/2") 29G X 1/2" 0.3 ML MISC Inject 1 Syringe 3 (three) times daily as directed. Check blood sugar three times daily. Dx: E11.9 05/10/17   [name], DO  lisinopril (PRINIVIL,ZESTRIL) 5 MG tablet Take 1 tablet (5 mg total) by mouth daily. For blood pressure and kidney protection 10/27/17   [name], DO  metFORMIN (GLUCOPHAGE) 500 MG tablet Take 2 tablets (1,000 mg total) by mouth 2 (two) times daily with a meal. 10/27/17   [name], DO  nitroGLYCERIN (NITROSTAT) 0.4 MG SL tablet Place 1 tablet (0.4 mg total) under the tongue every 5 (five) minutes as needed for chest pain. 10/27/17   [name], DO  omeprazole (PRILOSEC) 20 MG capsule Take 1 capsule (20 mg total) by mouth daily. 10/27/17   [name], DO  ranitidine (ZANTAC) 150 MG capsule Take 1 capsule (150 mg total) by mouth daily. 12/28/17   Tache Bobst, [name]    Family History Family History  Problem Relation Age of Onset  . Heart disease Mother   . Heart attack Mother 4  . Hypertension Mother   . Hyperlipidemia Mother   . Heart disease Father   . Heart attack Father 51  . Hypertension Father   . Hyperlipidemia Father   . Heart attack Sister 71  . Colon cancer Neg Hx   . Stomach cancer Neg Hx   . Esophageal cancer Neg Hx   . Rectal cancer Neg Hx     Social History Social History   Tobacco Use  . Smoking status: Never Smoker  . Smokeless tobacco: Never Used  Substance Use Topics  . Alcohol use: No  . Drug use: No     Allergies   Patient has no known  allergies.   Review of Systems Review of Systems  Constitutional: Negative for fever.  Skin: Positive for rash.     Physical Exam Updated Vital Signs BP 110/71 (BP Location: Right Arm)   Pulse 91   Temp 98 F (36.7 C) (Oral)   Resp 16   SpO2 100%   Physical Exam  Constitutional: He is oriented to person, place, and time. He appears well-developed and well-nourished. No distress.  Appears in no distress  HENT:  Head: Normocephalic and atraumatic.  Right Ear: Tympanic membrane, external ear and ear canal normal.  Left Ear: Tympanic membrane, external ear and ear canal normal.  Nose: Nose normal.  Mouth/Throat: Uvula is midline, oropharynx is clear and moist and mucous membranes are normal.  Eyes: Pupils are equal, round, and reactive to light. Conjunctivae and EOM are normal.  Neck: Normal range of motion. Neck supple.  Cardiovascular: Normal rate, regular rhythm and intact distal pulses.  Pulmonary/Chest: Effort normal and breath sounds normal. No respiratory distress. He has no wheezes.  Speaking in speaking full sentences.  Clear lung sounds in all fields.  No respiratory distress.  Abdominal: Soft. He exhibits no distension and no mass. There is no tenderness. There is no guarding.  Musculoskeletal: Normal range of motion.  Neurological: He is alert and oriented to person, place, and time.  Skin: Skin is warm and dry. Capillary refill takes less than 2 seconds. Rash noted.  Rash noted on bilateral forearms.  Ear;y rash with small macular raised grouped bumps.  Older rash is excoriated and scabbed.  No obvious vesicles.  No mucous membranes involvement.  No skin sloughing.  No involvement of the palms of the feet.  No blisters or bulla.  Psychiatric: He has a normal mood and affect.  Nursing note and vitals reviewed.    ED Treatments / Results  Labs (all labs ordered are listed, but only abnormal results are displayed) Labs Reviewed - No data to  display  EKG None  Radiology No results found.  Procedures Procedures (including critical care time)  Medications Ordered in ED Medications  triamcinolone cream (KENALOG) 0.5 % ( Topical Given 12/28/17 1454)     Initial Impression / Assessment and Plan / ED Course  I have reviewed the triage vital signs and the nursing notes.  Pertinent labs & imaging results that were available during my care of the patient were reviewed by me and considered in my medical decision making (see chart for details).     Patient presenting for evaluation of rash.  Physical exam reassuring, he is afebrile not tachycardic.  Appears nontoxic.  Rash without mucous membrane involvement or respiratory involvement.  Doubt SJS, TEN, RMSF, or syphilis rash.  Rash is only on forearms, likely contact related, ?poison ivy vs other.  Discussed with patient.  Discussed that at this time, I do not want to use prednisone due to his poorly controlled diabetes.  Will d/c with H1 and H2 blocker, and kenalog cream for itching.  Of note, heart rate is listed as 9, this is a typo.  On my exam, heart rate in the low 90s.  Discussed follow-up with primary care for evaluation of rash and for management of his diabetes.  At this time, patient appears safe for discharge.  He understands and agrees with   Final Clinical Impressions(s) / ED Diagnoses   Final diagnoses:  Rash and nonspecific skin eruption    ED Discharge Orders        Ordered    cetirizine (ZYRTEC) 10 MG tablet  Daily     12/28/17 1427    ranitidine (ZANTAC) 150 MG capsule  Daily     12/28/17 Laughlin, Jil Penland, [name] 12/28/17 1720    Fredia Sorrow, MD 01/01/18 [phone]

## 2017-12-28 NOTE — ED Triage Notes (Signed)
Patient complains of itching rashes to bilateral forearms x3 days. Denies exposure to known irritant, denies changing soap or detergent. Patient alert, oriented, and ambulating independently with steady gait.

## 2017-12-29 ENCOUNTER — Ambulatory Visit: Payer: Medicare HMO | Admitting: Internal Medicine

## 2018-01-11 ENCOUNTER — Ambulatory Visit: Payer: Medicare HMO | Admitting: Cardiology

## 2018-01-25 ENCOUNTER — Encounter (HOSPITAL_COMMUNITY): Payer: Self-pay | Admitting: *Deleted

## 2018-01-25 ENCOUNTER — Other Ambulatory Visit: Payer: Self-pay

## 2018-01-25 ENCOUNTER — Emergency Department (HOSPITAL_COMMUNITY): Payer: Medicare HMO

## 2018-01-25 ENCOUNTER — Emergency Department (HOSPITAL_COMMUNITY)
Admission: EM | Admit: 2018-01-25 | Discharge: 2018-01-26 | Disposition: A | Discharge status: Home / Self Care | Payer: Medicare HMO | Attending: Emergency Medicine | Admitting: Emergency Medicine

## 2018-01-25 DIAGNOSIS — R21 Rash and other nonspecific skin eruption: Secondary | ICD-10-CM | POA: Insufficient documentation

## 2018-01-25 DIAGNOSIS — I110 Hypertensive heart disease with heart failure: Secondary | ICD-10-CM | POA: Diagnosis not present

## 2018-01-25 DIAGNOSIS — Z794 Long term (current) use of insulin: Secondary | ICD-10-CM | POA: Insufficient documentation

## 2018-01-25 DIAGNOSIS — E1140 Type 2 diabetes mellitus with diabetic neuropathy, unspecified: Secondary | ICD-10-CM | POA: Diagnosis not present

## 2018-01-25 DIAGNOSIS — I428 Other cardiomyopathies: Secondary | ICD-10-CM | POA: Insufficient documentation

## 2018-01-25 DIAGNOSIS — R079 Chest pain, unspecified: Secondary | ICD-10-CM | POA: Insufficient documentation

## 2018-01-25 DIAGNOSIS — E785 Hyperlipidemia, unspecified: Secondary | ICD-10-CM | POA: Diagnosis not present

## 2018-01-25 DIAGNOSIS — Z7982 Long term (current) use of aspirin: Secondary | ICD-10-CM | POA: Insufficient documentation

## 2018-01-25 DIAGNOSIS — R0602 Shortness of breath: Secondary | ICD-10-CM | POA: Diagnosis not present

## 2018-01-25 DIAGNOSIS — I2510 Atherosclerotic heart disease of native coronary artery without angina pectoris: Secondary | ICD-10-CM | POA: Diagnosis not present

## 2018-01-25 DIAGNOSIS — Z79899 Other long term (current) drug therapy: Secondary | ICD-10-CM | POA: Insufficient documentation

## 2018-01-25 DIAGNOSIS — I5022 Chronic systolic (congestive) heart failure: Secondary | ICD-10-CM | POA: Diagnosis not present

## 2018-01-25 LAB — BASIC METABOLIC PANEL
Anion gap: 8 (ref 5–15)
BUN: 11 mg/dL (ref 6–20)
CO2: 24 mmol/L (ref 22–32)
Calcium: 8.8 mg/dL — ABNORMAL LOW (ref 8.9–10.3)
Chloride: 107 mmol/L (ref 98–111)
Creatinine, Ser: 1.33 mg/dL — ABNORMAL HIGH (ref 0.61–1.24)
GFR calc Af Amer: >60 mL/min (ref >60)
GFR calc non Af Amer: 58 mL/min — ABNORMAL LOW (ref >60)
Glucose, Bld: 248 mg/dL — ABNORMAL HIGH (ref 70–99)
Potassium: 3.9 mmol/L (ref 3.5–5.1)
Sodium: 139 mmol/L (ref 135–145)

## 2018-01-25 LAB — CBC WITH DIFFERENTIAL/PLATELET
Abs Immature Granulocytes: 0 10*3/uL (ref 0.0–0.1)
Basophils Absolute: 0 10*3/uL (ref 0.0–0.1)
Basophils Relative: 0 %
Eosinophils Absolute: 0.1 10*3/uL (ref 0.0–0.7)
Eosinophils Relative: 1 %
HCT: 46 % (ref 39.0–52.0)
Hemoglobin: 15.4 g/dL (ref 13.0–17.0)
Immature Granulocytes: 0 %
Lymphocytes Relative: 31 %
Lymphs Abs: 1.8 10*3/uL (ref 0.7–4.0)
MCH: 30.4 pg (ref 26.0–34.0)
MCHC: 33.5 g/dL (ref 30.0–36.0)
MCV: 90.9 fL (ref 78.0–100.0)
Monocytes Absolute: 0.3 10*3/uL (ref 0.1–1.0)
Monocytes Relative: 5 %
Neutro Abs: 3.5 10*3/uL (ref 1.7–7.7)
Neutrophils Relative %: 63 %
Platelets: 191 10*3/uL (ref 150–400)
RBC: 5.06 MIL/uL (ref 4.22–5.81)
RDW: 12.1 % (ref 11.5–15.5)
WBC: 5.7 10*3/uL (ref 4.0–10.5)

## 2018-01-25 LAB — I-STAT TROPONIN, ED: Troponin i, poc: 0.01 ng/mL (ref 0.00–0.08)

## 2018-01-25 MED ORDER — DIPHENHYDRAMINE HCL 50 MG/ML IJ SOLN
25.0000 mg | Freq: Once | INTRAMUSCULAR | Status: AC
  Administered 2018-01-25: 25 mg via INTRAVENOUS
  Filled 2018-01-25: qty 1

## 2018-01-25 MED ORDER — ASPIRIN 81 MG PO CHEW
324.0000 mg | CHEWABLE_TABLET | Freq: Once | ORAL | Status: AC
  Administered 2018-01-25: 324 mg via ORAL
  Filled 2018-01-25: qty 4

## 2018-01-25 MED ORDER — METHYLPREDNISOLONE SODIUM SUCC 125 MG IJ SOLR
125.0000 mg | Freq: Once | INTRAMUSCULAR | Status: AC
  Administered 2018-01-25: 125 mg via INTRAVENOUS
  Filled 2018-01-25: qty 2

## 2018-01-25 MED ORDER — FAMOTIDINE IN NACL 20-0.9 MG/50ML-% IV SOLN
20.0000 mg | Freq: Once | INTRAVENOUS | Status: AC
  Administered 2018-01-25: 20 mg via INTRAVENOUS
  Filled 2018-01-25: qty 50

## 2018-01-25 NOTE — ED Triage Notes (Addendum)
Pt took a shower about 30 minutes ago, noticed a rash, and has had itching since. Rash noted to arms, swelling noted to lip, hives noted to abd and back. Denies use of new medications, new soaps or new clothing

## 2018-01-25 NOTE — ED Provider Notes (Signed)
[facility] EMERGENCY DEPARTMENT Provider Note   CSN: [id number] Arrival date & time: 01/25/18  2150     History   Chief Complaint Chief Complaint  Patient presents with  . Rash  . Chest Pain    HPI Calvin [name] is a 57 y.o. male.  Patient presents to the emergency department with a chief complaint of rash.  He states that after getting out of the shower he noticed a rash on his trunk and extremities.  He states that it is very pruritic.  He denies any fevers or chills.  He states that he uses Dial soap, but has never had a reaction of this before.  He reports having had very sensitive skin.  He states that since arriving in the emergency department he feels short of breath and has chest pain.  He denies any cough.  Denies any other associated symptoms.  He has not taken anything for the symptoms.  The history is provided by the patient. No language interpreter was used.    Past Medical History:  Diagnosis Date  . Adenoidal hypertrophy   . Adenomatous colon polyps 2012  . Arthritis   . CAD (coronary artery disease)    a. cath in 08/2017 showing mild nonobstructive CAD with scattered 20-30% stenosis.   . Diabetes mellitus   . GERD (gastroesophageal reflux disease)   . Heart attack ([place])    While living in [place]  [name] Hx of adenomatous colonic polyps 08/18/2017  . Hyperlipidemia   . Hypertension   . Nonischemic cardiomyopathy ([place])    a. EF 20-25% by echo in 09/2017 with cath showing mild CAD.   [name] Obesity   . Sleep apnea    cpap    Patient Active Problem List   Diagnosis Date Noted  . Diabetic neuropathy ([place]) 09/26/2017  . MCI (mild cognitive impairment) 06/23/2017  . OSA on CPAP 05/10/2017  . Gastroesophageal reflux disease 05/10/2017  . Insomnia 05/10/2017  . Onychomycosis of multiple toenails with type 2 diabetes mellitus and peripheral neuropathy ([place]) 05/10/2017  . Chronic systolic heart failure ([place]) 11/22/2015  . Essential hypertension  11/22/2015  . DM (diabetes mellitus) ([place]) 11/20/2015  . HLD (hyperlipidemia) 11/20/2015  . Hx of adenomatous colonic polyps 05/19/2011    Past Surgical History:  Procedure Laterality Date  . COLONOSCOPY  05/13/11   9 adenomas  . FOREARM SURGERY    . MUSCLE BIOPSY    . RIGHT/LEFT HEART CATH AND CORONARY ANGIOGRAPHY N/A 08/25/2017   Procedure: RIGHT/LEFT HEART CATH AND CORONARY ANGIOGRAPHY;  Surgeon: [name], MD;  Location: [facility];  Service: Cardiovascular;  Laterality: N/A;        Home Medications    Prior to Admission medications   Medication Sig Start Date End Date Taking? Authorizing Provider  amoxicillin (AMOXIL) 500 MG capsule Take 1 capsule (500 mg total) by mouth 2 (two) times daily. For sinus infection 10/27/17   [name], DO  aspirin EC 81 MG tablet Take 81 mg by mouth daily.    [provider]  atorvastatin (LIPITOR) 40 MG tablet Take 1 tablet (40 mg total) by mouth daily. 10/27/17 01/25/18  [name], DO  Blood Glucose Monitoring Suppl (TRUE METRIX AIR GLUCOSE METER) w/Device KIT 1 strip 3 (three) times daily by Does not apply route. Use 1 strip to check blood sugar three (3) times daily. Dx:E11.9 05/10/17   [name], DO  carvedilol (COREG) 3.125 MG tablet Take 1 tablet (3.125 mg total)  by mouth 2 (two) times daily with a meal. For blood pressure 10/27/17   [name], DO  cetirizine (ZYRTEC) 10 MG tablet Take 1 tablet (10 mg total) by mouth daily. 12/28/17   [name], [name], [name]  docusate sodium (COLACE) 100 MG capsule Take 2 capsules (200 mg total) by mouth daily. 07/25/17   [name], DO  donepezil (ARICEPT) 10 MG tablet Take 1 tablet (10 mg total) by mouth at bedtime. 10/27/17   [name], DO  Gabapentin 300 MG/6ML SOLN Take 300 mg by mouth 2 (two) times daily. For nerve pain 10/27/17   [name], DO  insulin detemir (LEVEMIR) 100 UNIT/ML injection Inject 26 units subcut daily for diabetes 07/25/17    [name], DO  Insulin Syringe-Needle U-100 (INSULIN SYRINGE .3CC/29GX1/2") 29G X 1/2" 0.3 ML MISC Inject 1 Syringe 3 (three) times daily as directed. Check blood sugar three times daily. Dx: E11.9 05/10/17   [name], DO  lisinopril (PRINIVIL,ZESTRIL) 5 MG tablet Take 1 tablet (5 mg total) by mouth daily. For blood pressure and kidney protection 10/27/17   [name], DO  metFORMIN (GLUCOPHAGE) 500 MG tablet Take 2 tablets (1,000 mg total) by mouth 2 (two) times daily with a meal. 10/27/17   [name], DO  nitroGLYCERIN (NITROSTAT) 0.4 MG SL tablet Place 1 tablet (0.4 mg total) under the tongue every 5 (five) minutes as needed for chest pain. 10/27/17   [name], DO  omeprazole (PRILOSEC) 20 MG capsule Take 1 capsule (20 mg total) by mouth daily. 10/27/17   [name], DO  ranitidine (ZANTAC) 150 MG capsule Take 1 capsule (150 mg total) by mouth daily. 12/28/17   [name], [name], [name]    Family History Family History  Problem Relation Age of Onset  . Heart disease Mother   . Heart attack Mother 76  . Hypertension Mother   . Hyperlipidemia Mother   . Heart disease Father   . Heart attack Father 10  . Hypertension Father   . Hyperlipidemia Father   . Heart attack Sister 43  . Colon cancer Neg Hx   . Stomach cancer Neg Hx   . Esophageal cancer Neg Hx   . Rectal cancer Neg Hx     Social History Social History   Tobacco Use  . Smoking status: Never Smoker  . Smokeless tobacco: Never Used  Substance Use Topics  . Alcohol use: No  . Drug use: No     Allergies   Patient has no known allergies.   Review of Systems Review of Systems  All other systems reviewed and are negative.    Physical Exam Updated Vital Signs BP 113/70   Pulse (!) 114   Temp 98 F (36.7 C)   Resp (!) 22   SpO2 99%   Physical Exam  Constitutional: He is oriented to person, place, and time. He appears well-developed and well-nourished.  HENT:  Head: Normocephalic and  atraumatic.  Eyes: Pupils are equal, round, and reactive to light. Conjunctivae and EOM are normal. Right eye exhibits no discharge. Left eye exhibits no discharge. No scleral icterus.  Neck: Normal range of motion. Neck supple. No JVD present.  Cardiovascular: Normal rate, regular rhythm and normal heart sounds. Exam reveals no gallop and no friction rub.  No murmur heard. Pulmonary/Chest: Effort normal and breath sounds normal. No respiratory distress. He has no wheezes. He has no rales. He exhibits no tenderness.  Lungs are clear to auscultation bilaterally  Abdominal: Soft. He exhibits no  distension and no mass. There is no tenderness. There is no rebound and no guarding.  Musculoskeletal: Normal range of motion. He exhibits no edema or tenderness.  Neurological: He is alert and oriented to person, place, and time.  Skin: Skin is warm and dry.  Hives on trunk and upper extremities  Psychiatric: He has a normal mood and affect. His behavior is normal. Judgment and thought content normal.  Nursing note and vitals reviewed.    ED Treatments / Results  Labs (all labs ordered are listed, but only abnormal results are displayed) Labs Reviewed  BASIC METABOLIC PANEL - Abnormal; Notable for the following components:      Result Value   Glucose, Bld 248 (*)    Creatinine, Ser 1.33 (*)    Calcium 8.8 (*)    GFR calc non Af Amer 58 (*)    All other components within normal limits  CBC WITH DIFFERENTIAL/PLATELET  I-STAT TROPONIN, ED  I-STAT TROPONIN, ED    EKG EKG Interpretation  Date/Time:  Thursday January 25 2018 22:47:19 EDT Ventricular Rate:  87 PR Interval:    QRS Duration: 108 QT Interval:  360 QTC Calculation: 433 R Axis:   -27 Text Interpretation:  Sinus rhythm Borderline left axis deviation RSR' in V1 or V2, probably normal variant Nonspecific T abnrm, anterolateral leads Minimal ST elevation, anterior leads No significant change since last tracing Confirmed by Isla Pence [phone]) on 01/25/2018 10:52:10 PM   Radiology Dg Chest Port 1 View  Result Date: 01/25/2018 CLINICAL DATA:  Chest pain EXAM: PORTABLE CHEST 1 VIEW COMPARISON:  CT 10/14/2017, radiograph 10/14/2017 FINDINGS: The heart size and mediastinal contours are within normal limits. Both lungs are clear. The visualized skeletal structures are unremarkable. IMPRESSION: No active disease. Electronically Signed   By: Donavan Foil M.D.   On: 01/25/2018 22:40    Procedures Procedures (including critical care time)  Medications Ordered in ED Medications  diphenhydrAMINE (BENADRYL) injection 25 mg (25 mg Intravenous Given 01/25/18 2304)  aspirin chewable tablet 324 mg (324 mg Oral Given 01/25/18 2305)  famotidine (PEPCID) IVPB 20 mg premix (0 mg Intravenous Stopped 01/25/18 2339)  methylPREDNISolone sodium succinate (SOLU-MEDROL) 125 mg/2 mL injection 125 mg (125 mg Intravenous Given 01/25/18 2304)     Initial Impression / Assessment and Plan / ED Course  I have reviewed the triage vital signs and the nursing notes.  Pertinent labs & imaging results that were available during my care of the patient were reviewed by me and considered in my medical decision making (see chart for details).     Patient with 2 complaints.  1.  Rash: Appears to be urticaria.  Possibly secondary to heat versus exposure to soap while taking a shower.  Will give Benadryl, Pepcid, and Solu-Medrol.  Doubt anaphylaxis, no vomiting, no diarrhea, no wheezing, no throat swelling.  2.  Chest pain: Symptoms started when he arrived in the emergency department.  Could be anxiety, however patient does have a significant heart history, will check basic labs, troponin, and EKG.  No ischemic changes seen on EKG.  Troponin and repeat troponin are negative.  Patient feels improved.  We will discharged home.  Discussed with Dr. Leonette Monarch, who agrees with plan.  Final Clinical Impressions(s) / ED Diagnoses   Final diagnoses:  Rash  Chest  pain, unspecified type    ED Discharge Orders    None       Montine Circle, [name] 01/26/18 0301    Isla Pence, MD 01/26/18  1729  

## 2018-01-26 LAB — I-STAT TROPONIN, ED: Troponin i, poc: 0.02 ng/mL (ref 0.00–0.08)

## 2018-01-26 NOTE — Discharge Instructions (Signed)
Take Benadryl for the rash.  Return if symptoms worsen.  Please follow-up with your regular doctor and/or cardiologist.

## 2018-02-21 ENCOUNTER — Encounter: Payer: Self-pay | Admitting: Internal Medicine

## 2018-02-27 ENCOUNTER — Ambulatory Visit: Payer: Medicare HMO | Admitting: Cardiology

## 2018-03-01 ENCOUNTER — Other Ambulatory Visit: Payer: Self-pay

## 2018-03-01 DIAGNOSIS — E1140 Type 2 diabetes mellitus with diabetic neuropathy, unspecified: Secondary | ICD-10-CM

## 2018-03-01 DIAGNOSIS — Z794 Long term (current) use of insulin: Principal | ICD-10-CM

## 2018-03-02 ENCOUNTER — Other Ambulatory Visit: Payer: Medicare HMO

## 2018-03-02 DIAGNOSIS — E1140 Type 2 diabetes mellitus with diabetic neuropathy, unspecified: Secondary | ICD-10-CM

## 2018-03-02 DIAGNOSIS — Z794 Long term (current) use of insulin: Secondary | ICD-10-CM | POA: Diagnosis not present

## 2018-03-03 LAB — HEMOGLOBIN A1C
Hgb A1c MFr Bld: 12.1 % of total Hgb — ABNORMAL HIGH (ref <5.7)
Mean Plasma Glucose: 301 (calc)
eAG (mmol/L): 16.6 (calc)

## 2018-03-06 ENCOUNTER — Telehealth: Payer: Self-pay

## 2018-03-06 DIAGNOSIS — E1140 Type 2 diabetes mellitus with diabetic neuropathy, unspecified: Secondary | ICD-10-CM

## 2018-03-06 DIAGNOSIS — Z794 Long term (current) use of insulin: Principal | ICD-10-CM

## 2018-03-06 MED ORDER — INSULIN DETEMIR 100 UNIT/ML ~~LOC~~ SOLN: SUBCUTANEOUS | 3 refills | Status: DC

## 2018-03-06 NOTE — Telephone Encounter (Signed)
-----   Message from [place], [place] sent at 03/04/2018  9:49 AM EDT ----- Diabetes uncontrolled - avoid complex carbs; increase insulin to 30 units daily; continue other medications as ordered; highly recommend endocrinology referral (diabetic specialist) due to uncontrolled sugars; follow up as scheduled

## 2018-03-06 NOTE — Telephone Encounter (Signed)
Discussed results with patient, patient verbalized understanding of results  Patient in agreement with referral to endocrinology referral, order placed   RX sent for increased dose of insulin

## 2018-03-09 ENCOUNTER — Observation Stay (HOSPITAL_COMMUNITY)
Admission: EM | Admit: 2018-03-09 | Discharge: 2018-03-11 | Disposition: A | Discharge status: Home / Self Care | Payer: Medicare HMO | Attending: Internal Medicine | Admitting: Internal Medicine

## 2018-03-09 ENCOUNTER — Emergency Department (HOSPITAL_COMMUNITY): Payer: Medicare HMO

## 2018-03-09 ENCOUNTER — Encounter (HOSPITAL_COMMUNITY): Payer: Self-pay | Admitting: Emergency Medicine

## 2018-03-09 ENCOUNTER — Other Ambulatory Visit: Payer: Self-pay

## 2018-03-09 DIAGNOSIS — R0602 Shortness of breath: Secondary | ICD-10-CM

## 2018-03-09 DIAGNOSIS — I2510 Atherosclerotic heart disease of native coronary artery without angina pectoris: Secondary | ICD-10-CM | POA: Diagnosis not present

## 2018-03-09 DIAGNOSIS — Z794 Long term (current) use of insulin: Secondary | ICD-10-CM | POA: Diagnosis not present

## 2018-03-09 DIAGNOSIS — E1159 Type 2 diabetes mellitus with other circulatory complications: Secondary | ICD-10-CM

## 2018-03-09 DIAGNOSIS — Z86010 Personal history of colonic polyps: Secondary | ICD-10-CM

## 2018-03-09 DIAGNOSIS — Z9989 Dependence on other enabling machines and devices: Secondary | ICD-10-CM

## 2018-03-09 DIAGNOSIS — G3184 Mild cognitive impairment, so stated: Secondary | ICD-10-CM | POA: Diagnosis not present

## 2018-03-09 DIAGNOSIS — B351 Tinea unguium: Secondary | ICD-10-CM

## 2018-03-09 DIAGNOSIS — G4733 Obstructive sleep apnea (adult) (pediatric): Secondary | ICD-10-CM | POA: Diagnosis not present

## 2018-03-09 DIAGNOSIS — I252 Old myocardial infarction: Secondary | ICD-10-CM | POA: Insufficient documentation

## 2018-03-09 DIAGNOSIS — I110 Hypertensive heart disease with heart failure: Secondary | ICD-10-CM | POA: Insufficient documentation

## 2018-03-09 DIAGNOSIS — E785 Hyperlipidemia, unspecified: Secondary | ICD-10-CM | POA: Diagnosis present

## 2018-03-09 DIAGNOSIS — I480 Paroxysmal atrial fibrillation: Secondary | ICD-10-CM | POA: Diagnosis present

## 2018-03-09 DIAGNOSIS — Z7982 Long term (current) use of aspirin: Secondary | ICD-10-CM | POA: Insufficient documentation

## 2018-03-09 DIAGNOSIS — R112 Nausea with vomiting, unspecified: Secondary | ICD-10-CM | POA: Diagnosis not present

## 2018-03-09 DIAGNOSIS — R079 Chest pain, unspecified: Secondary | ICD-10-CM | POA: Diagnosis not present

## 2018-03-09 DIAGNOSIS — K219 Gastro-esophageal reflux disease without esophagitis: Secondary | ICD-10-CM | POA: Diagnosis not present

## 2018-03-09 DIAGNOSIS — E1169 Type 2 diabetes mellitus with other specified complication: Secondary | ICD-10-CM

## 2018-03-09 DIAGNOSIS — E119 Type 2 diabetes mellitus without complications: Secondary | ICD-10-CM

## 2018-03-09 DIAGNOSIS — M1990 Unspecified osteoarthritis, unspecified site: Secondary | ICD-10-CM | POA: Insufficient documentation

## 2018-03-09 DIAGNOSIS — Z860101 Personal history of adenomatous and serrated colon polyps: Secondary | ICD-10-CM

## 2018-03-09 DIAGNOSIS — I5022 Chronic systolic (congestive) heart failure: Secondary | ICD-10-CM | POA: Diagnosis not present

## 2018-03-09 DIAGNOSIS — I429 Cardiomyopathy, unspecified: Secondary | ICD-10-CM | POA: Insufficient documentation

## 2018-03-09 DIAGNOSIS — Z79899 Other long term (current) drug therapy: Secondary | ICD-10-CM | POA: Diagnosis not present

## 2018-03-09 DIAGNOSIS — E1149 Type 2 diabetes mellitus with other diabetic neurological complication: Secondary | ICD-10-CM

## 2018-03-09 DIAGNOSIS — Z7901 Long term (current) use of anticoagulants: Secondary | ICD-10-CM | POA: Insufficient documentation

## 2018-03-09 DIAGNOSIS — E1140 Type 2 diabetes mellitus with diabetic neuropathy, unspecified: Secondary | ICD-10-CM | POA: Diagnosis not present

## 2018-03-09 DIAGNOSIS — E1142 Type 2 diabetes mellitus with diabetic polyneuropathy: Secondary | ICD-10-CM | POA: Insufficient documentation

## 2018-03-09 DIAGNOSIS — R0789 Other chest pain: Principal | ICD-10-CM | POA: Diagnosis present

## 2018-03-09 DIAGNOSIS — G4700 Insomnia, unspecified: Secondary | ICD-10-CM

## 2018-03-09 DIAGNOSIS — I10 Essential (primary) hypertension: Secondary | ICD-10-CM | POA: Diagnosis present

## 2018-03-09 DIAGNOSIS — E1165 Type 2 diabetes mellitus with hyperglycemia: Secondary | ICD-10-CM | POA: Insufficient documentation

## 2018-03-09 DIAGNOSIS — R9431 Abnormal electrocardiogram [ECG] [EKG]: Secondary | ICD-10-CM

## 2018-03-09 LAB — HEMOGLOBIN A1C
Hgb A1c MFr Bld: 11.9 % — ABNORMAL HIGH (ref 4.8–5.6)
Mean Plasma Glucose: 294.83 mg/dL

## 2018-03-09 LAB — TROPONIN I
Troponin I: <0.03 ng/mL (ref <0.03)
Troponin I: <0.03 ng/mL (ref <0.03)

## 2018-03-09 LAB — COMPREHENSIVE METABOLIC PANEL
ALT: 18 U/L (ref 0–44)
AST: 18 U/L (ref 15–41)
Albumin: 3.7 g/dL (ref 3.5–5.0)
Alkaline Phosphatase: 72 U/L (ref 38–126)
Anion gap: 12 (ref 5–15)
BUN: 9 mg/dL (ref 6–20)
CO2: 26 mmol/L (ref 22–32)
Calcium: 9.5 mg/dL (ref 8.9–10.3)
Chloride: 99 mmol/L (ref 98–111)
Creatinine, Ser: 1.23 mg/dL (ref 0.61–1.24)
GFR calc Af Amer: >60 mL/min (ref >60)
GFR calc non Af Amer: >60 mL/min (ref >60)
Glucose, Bld: 274 mg/dL — ABNORMAL HIGH (ref 70–99)
Potassium: 4.2 mmol/L (ref 3.5–5.1)
Sodium: 137 mmol/L (ref 135–145)
Total Bilirubin: 1 mg/dL (ref 0.3–1.2)
Total Protein: 6.4 g/dL — ABNORMAL LOW (ref 6.5–8.1)

## 2018-03-09 LAB — CREATININE, SERUM
Creatinine, Ser: 1.14 mg/dL (ref 0.61–1.24)
GFR calc Af Amer: >60 mL/min (ref >60)
GFR calc non Af Amer: >60 mL/min (ref >60)

## 2018-03-09 LAB — LIPID PANEL
Cholesterol: 222 mg/dL — ABNORMAL HIGH (ref 0–200)
HDL: 58 mg/dL (ref >40)
LDL Cholesterol: 154 mg/dL — ABNORMAL HIGH (ref 0–99)
Total CHOL/HDL Ratio: 3.8 RATIO
Triglycerides: 52 mg/dL (ref <150)
VLDL: 10 mg/dL (ref 0–40)

## 2018-03-09 LAB — CBC
HCT: 46 % (ref 39.0–52.0)
HCT: 47.3 % (ref 39.0–52.0)
Hemoglobin: 15.7 g/dL (ref 13.0–17.0)
Hemoglobin: 15.7 g/dL (ref 13.0–17.0)
MCH: 30.5 pg (ref 26.0–34.0)
MCH: 30.1 pg (ref 26.0–34.0)
MCHC: 34.1 g/dL (ref 30.0–36.0)
MCHC: 33.2 g/dL (ref 30.0–36.0)
MCV: 89.3 fL (ref 78.0–100.0)
MCV: 90.6 fL (ref 78.0–100.0)
Platelets: 208 10*3/uL (ref 150–400)
Platelets: 229 10*3/uL (ref 150–400)
RBC: 5.15 MIL/uL (ref 4.22–5.81)
RBC: 5.22 MIL/uL (ref 4.22–5.81)
RDW: 11.8 % (ref 11.5–15.5)
RDW: 11.9 % (ref 11.5–15.5)
WBC: 7.6 10*3/uL (ref 4.0–10.5)
WBC: 7.8 10*3/uL (ref 4.0–10.5)

## 2018-03-09 LAB — I-STAT TROPONIN, ED
Troponin i, poc: 0.02 ng/mL (ref 0.00–0.08)
Troponin i, poc: 0.02 ng/mL (ref 0.00–0.08)

## 2018-03-09 LAB — PHOSPHORUS: Phosphorus: 3.5 mg/dL (ref 2.5–4.6)

## 2018-03-09 LAB — BRAIN NATRIURETIC PEPTIDE: B Natriuretic Peptide: 132.4 pg/mL — ABNORMAL HIGH (ref 0.0–100.0)

## 2018-03-09 LAB — LIPASE, BLOOD: Lipase: 33 U/L (ref 11–51)

## 2018-03-09 LAB — CBG MONITORING, ED: Glucose-Capillary: 262 mg/dL — ABNORMAL HIGH (ref 70–99)

## 2018-03-09 LAB — MAGNESIUM: Magnesium: 1.9 mg/dL (ref 1.7–2.4)

## 2018-03-09 LAB — GLUCOSE, CAPILLARY: Glucose-Capillary: 258 mg/dL — ABNORMAL HIGH (ref 70–99)

## 2018-03-09 MED ORDER — ONDANSETRON HCL 4 MG/2ML IJ SOLN
4.0000 mg | Freq: Once | INTRAMUSCULAR | Status: AC
  Administered 2018-03-09: 4 mg via INTRAVENOUS
  Filled 2018-03-09: qty 2

## 2018-03-09 MED ORDER — CARVEDILOL 3.125 MG PO TABS
3.1250 mg | ORAL_TABLET | Freq: Two times a day (BID) | ORAL | Status: DC
  Administered 2018-03-10: 3.125 mg via ORAL
  Filled 2018-03-09: qty 1

## 2018-03-09 MED ORDER — ONDANSETRON HCL 4 MG/2ML IJ SOLN
4.0000 mg | Freq: Four times a day (QID) | INTRAMUSCULAR | Status: DC | PRN
  Administered 2018-03-09 – 2018-03-10 (×2): 4 mg via INTRAVENOUS
  Filled 2018-03-09 (×2): qty 2

## 2018-03-09 MED ORDER — ASPIRIN EC 81 MG PO TBEC
81.0000 mg | DELAYED_RELEASE_TABLET | Freq: Every day | ORAL | Status: DC
  Administered 2018-03-10: 81 mg via ORAL
  Filled 2018-03-09 (×2): qty 1

## 2018-03-09 MED ORDER — ONDANSETRON HCL 4 MG/2ML IJ SOLN
INTRAMUSCULAR | Status: AC
  Filled 2018-03-09: qty 2

## 2018-03-09 MED ORDER — FAMOTIDINE IN NACL 20-0.9 MG/50ML-% IV SOLN
20.0000 mg | Freq: Once | INTRAVENOUS | Status: AC
  Administered 2018-03-09: 20 mg via INTRAVENOUS
  Filled 2018-03-09: qty 50

## 2018-03-09 MED ORDER — DONEPEZIL HCL 10 MG PO TABS
10.0000 mg | ORAL_TABLET | Freq: Every day | ORAL | Status: DC
  Administered 2018-03-09 – 2018-03-10 (×2): 10 mg via ORAL
  Filled 2018-03-09 (×2): qty 1

## 2018-03-09 MED ORDER — FAMOTIDINE IN NACL 20-0.9 MG/50ML-% IV SOLN: 20.0000 mg | Freq: Two times a day (BID) | INTRAVENOUS | Status: DC

## 2018-03-09 MED ORDER — LISINOPRIL 5 MG PO TABS
5.0000 mg | ORAL_TABLET | Freq: Every day | ORAL | Status: DC
  Administered 2018-03-09 – 2018-03-11 (×3): 5 mg via ORAL
  Filled 2018-03-09 (×3): qty 1

## 2018-03-09 MED ORDER — OXYCODONE-ACETAMINOPHEN 5-325 MG PO TABS: 1.0000 | ORAL_TABLET | Freq: Once | ORAL | Status: DC

## 2018-03-09 MED ORDER — MORPHINE SULFATE (PF) 4 MG/ML IV SOLN
4.0000 mg | Freq: Once | INTRAVENOUS | Status: AC
  Administered 2018-03-09: 4 mg via INTRAVENOUS
  Filled 2018-03-09: qty 1

## 2018-03-09 MED ORDER — ALUM & MAG HYDROXIDE-SIMETH 200-200-20 MG/5ML PO SUSP
30.0000 mL | ORAL | Status: DC | PRN
  Administered 2018-03-09 – 2018-03-10 (×2): 30 mL via ORAL
  Filled 2018-03-09 (×2): qty 30

## 2018-03-09 MED ORDER — ASPIRIN 81 MG PO CHEW
324.0000 mg | CHEWABLE_TABLET | Freq: Once | ORAL | Status: AC
Start: 2018-03-09 — End: 2018-03-09
  Administered 2018-03-09: 324 mg via ORAL
  Filled 2018-03-09: qty 4

## 2018-03-09 MED ORDER — GI COCKTAIL ~~LOC~~
30.0000 mL | Freq: Once | ORAL | Status: AC
  Administered 2018-03-09: 30 mL via ORAL
  Filled 2018-03-09: qty 30

## 2018-03-09 MED ORDER — ENOXAPARIN SODIUM 40 MG/0.4ML ~~LOC~~ SOLN
40.0000 mg | SUBCUTANEOUS | Status: DC
  Administered 2018-03-09: 40 mg via SUBCUTANEOUS
  Filled 2018-03-09: qty 0.4

## 2018-03-09 MED ORDER — LORATADINE 10 MG PO TABS
10.0000 mg | ORAL_TABLET | Freq: Every day | ORAL | Status: DC
  Administered 2018-03-09 – 2018-03-11 (×3): 10 mg via ORAL
  Filled 2018-03-09 (×3): qty 1

## 2018-03-09 MED ORDER — SODIUM CHLORIDE 0.9 % IV SOLN
INTRAVENOUS | Status: DC
  Administered 2018-03-09 – 2018-03-10 (×2): via INTRAVENOUS

## 2018-03-09 MED ORDER — SODIUM CHLORIDE 0.9 % IV BOLUS
1000.0000 mL | Freq: Once | INTRAVENOUS | Status: AC
  Administered 2018-03-09: 1000 mL via INTRAVENOUS

## 2018-03-09 MED ORDER — INSULIN ASPART 100 UNIT/ML ~~LOC~~ SOLN
0.0000 [IU] | SUBCUTANEOUS | Status: DC
  Administered 2018-03-09 (×2): 5 [IU] via SUBCUTANEOUS
  Administered 2018-03-10: 2 [IU] via SUBCUTANEOUS
  Administered 2018-03-10 (×2): 3 [IU] via SUBCUTANEOUS
  Administered 2018-03-10: 2 [IU] via SUBCUTANEOUS
  Administered 2018-03-10 – 2018-03-11 (×2): 3 [IU] via SUBCUTANEOUS
  Administered 2018-03-11: 2 [IU] via SUBCUTANEOUS

## 2018-03-09 MED ORDER — FAMOTIDINE IN NACL 20-0.9 MG/50ML-% IV SOLN
20.0000 mg | Freq: Two times a day (BID) | INTRAVENOUS | Status: DC
  Administered 2018-03-09 – 2018-03-10 (×2): 20 mg via INTRAVENOUS
  Filled 2018-03-09 (×2): qty 50

## 2018-03-09 MED ORDER — MORPHINE SULFATE (PF) 4 MG/ML IV SOLN
4.0000 mg | Freq: Once | INTRAVENOUS | Status: AC
Start: 2018-03-09 — End: 2018-03-09
  Administered 2018-03-09: 4 mg via INTRAVENOUS
  Filled 2018-03-09: qty 1

## 2018-03-09 MED ORDER — ONDANSETRON HCL 4 MG/2ML IJ SOLN
4.0000 mg | Freq: Once | INTRAMUSCULAR | Status: AC
  Administered 2018-03-09: 14:00:00 via INTRAVENOUS

## 2018-03-09 NOTE — ED Notes (Signed)
ED Provider at bedside. 

## 2018-03-09 NOTE — ED Provider Notes (Signed)
[facility]ER[facility] DEPARTMENT Provider Note   CSN: [id number] Arrival date & time: 03/09/18  5188     History   Chief Complaint Chief Complaint  Patient presents with  . Chest Pain  . Vomiting    HPI Calvin [name] is a 57 y.o. male.  He is complaining of substernal chest pain, nausea  vomiting, dizzy, since last evening.  He said it started at rest.  He vomited multiple times along with chest pain.  No blood in the vomitus.  His symptoms continued this morning and so he took the bus here.  He rates the pain as 10 out of 10 in his chest.  He states he gets chest pain about once or twice every couple weeks.  He is not sure if he has a heart attack but he was told he had one while in [place].  He denies any cocaine or alcohol.  Non-smoker.  He also states he knows his sugars are high.  The history is provided by the patient.  Chest Pain   This is a recurrent problem. The current episode started 6 to 12 hours ago. The problem occurs constantly. The problem has not changed since onset.The pain is associated with rest. The pain is present in the substernal region. The pain is at a severity of 10/10. The quality of the pain is described as pressure-like. The pain does not radiate. Associated symptoms include abdominal pain, diaphoresis, dizziness, nausea, near-syncope, shortness of breath, vomiting and weakness. Pertinent negatives include no fever, no headaches, no hemoptysis, no leg pain and no lower extremity edema. He has tried nothing for the symptoms. The treatment provided no relief. Risk factors include male gender.  His past medical history is significant for CAD and diabetes.  Procedure history is positive for cardiac catheterization.    Past Medical History:  Diagnosis Date  . Adenoidal hypertrophy   . Adenomatous colon polyps 2012  . Arthritis   . CAD (coronary artery disease)    a. cath in 08/2017 showing mild nonobstructive CAD with scattered 20-30%  stenosis.   . Diabetes mellitus   . GERD (gastroesophageal reflux disease)   . Heart attack ([place])    While living in [place]  [name] Hx of adenomatous colonic polyps 08/18/2017  . Hyperlipidemia   . Hypertension   . Nonischemic cardiomyopathy ([place])    a. EF 20-25% by echo in 09/2017 with cath showing mild CAD.   [name] Obesity   . Sleep apnea    cpap    Patient Active Problem List   Diagnosis Date Noted  . Diabetic neuropathy ([place]) 09/26/2017  . MCI (mild cognitive impairment) 06/23/2017  . OSA on CPAP 05/10/2017  . Gastroesophageal reflux disease 05/10/2017  . Insomnia 05/10/2017  . Onychomycosis of multiple toenails with type 2 diabetes mellitus and peripheral neuropathy ([place]) 05/10/2017  . Chronic systolic heart failure ([place]) 11/22/2015  . Essential hypertension 11/22/2015  . DM (diabetes mellitus) ([place]) 11/20/2015  . HLD (hyperlipidemia) 11/20/2015  . Hx of adenomatous colonic polyps 05/19/2011    Past Surgical History:  Procedure Laterality Date  . COLONOSCOPY  05/13/11   9 adenomas  . FOREARM SURGERY    . MUSCLE BIOPSY    . RIGHT/LEFT HEART CATH AND CORONARY ANGIOGRAPHY N/A 08/25/2017   Procedure: RIGHT/LEFT HEART CATH AND CORONARY ANGIOGRAPHY;  Surgeon: [name], MD;  Location: [facility];  Service: Cardiovascular;  Laterality: N/A;        Home Medications  Prior to Admission medications   Medication Sig Start Date End Date Taking? Authorizing Provider  amoxicillin (AMOXIL) 500 MG capsule Take 1 capsule (500 mg total) by mouth 2 (two) times daily. For sinus infection Patient not taking: Reported on 01/25/2018 10/27/17   [name], DO  aspirin EC 81 MG tablet Take 81 mg by mouth daily.    [provider]  atorvastatin (LIPITOR) 40 MG tablet Take 1 tablet (40 mg total) by mouth daily. Patient not taking: Reported on 01/25/2018 10/27/17 01/25/18  [name], DO  Blood Glucose Monitoring Suppl (TRUE METRIX AIR GLUCOSE METER) w/Device KIT 1  strip 3 (three) times daily by Does not apply route. Use 1 strip to check blood sugar three (3) times daily. Dx:E11.9 05/10/17   [name], DO  carvedilol (COREG) 3.125 MG tablet Take 1 tablet (3.125 mg total) by mouth 2 (two) times daily with a meal. For blood pressure 10/27/17   [name], DO  cetirizine (ZYRTEC) 10 MG tablet Take 1 tablet (10 mg total) by mouth daily. 12/28/17   [name], [name], [name]  docusate sodium (COLACE) 100 MG capsule Take 2 capsules (200 mg total) by mouth daily. Patient not taking: Reported on 01/25/2018 07/25/17   [name], DO  donepezil (ARICEPT) 10 MG tablet Take 1 tablet (10 mg total) by mouth at bedtime. 10/27/17   [name], DO  Gabapentin 300 MG/6ML SOLN Take 300 mg by mouth 2 (two) times daily. For nerve pain 10/27/17   [name], DO  insulin detemir (LEVEMIR) 100 UNIT/ML injection Inject 30 units subcut daily for diabetes 03/06/18   [name], DO  Insulin Syringe-Needle U-100 (INSULIN SYRINGE .3CC/29GX1/2") 29G X 1/2" 0.3 ML MISC Inject 1 Syringe 3 (three) times daily as directed. Check blood sugar three times daily. Dx: E11.9 05/10/17   [name], DO  lisinopril (PRINIVIL,ZESTRIL) 5 MG tablet Take 1 tablet (5 mg total) by mouth daily. For blood pressure and kidney protection 10/27/17   [name], DO  metFORMIN (GLUCOPHAGE) 500 MG tablet Take 2 tablets (1,000 mg total) by mouth 2 (two) times daily with a meal. 10/27/17   [name], DO  nitroGLYCERIN (NITROSTAT) 0.4 MG SL tablet Place 1 tablet (0.4 mg total) under the tongue every 5 (five) minutes as needed for chest pain. 10/27/17   [name], DO  omeprazole (PRILOSEC) 20 MG capsule Take 1 capsule (20 mg total) by mouth daily. 10/27/17   [name], DO  ranitidine (ZANTAC) 150 MG capsule Take 1 capsule (150 mg total) by mouth daily. 12/28/17   [name], [name], [name]    Family History Family History  Problem Relation Age of Onset  . Heart disease Mother   . Heart  attack Mother 24  . Hypertension Mother   . Hyperlipidemia Mother   . Heart disease Father   . Heart attack Father 57  . Hypertension Father   . Hyperlipidemia Father   . Heart attack Sister 2  . Colon cancer Neg Hx   . Stomach cancer Neg Hx   . Esophageal cancer Neg Hx   . Rectal cancer Neg Hx     Social History Social History   Tobacco Use  . Smoking status: Never Smoker  . Smokeless tobacco: Never Used  Substance Use Topics  . Alcohol use: No  . Drug use: No     Allergies   Patient has no known allergies.   Review of Systems Review of Systems  Constitutional: Positive for diaphoresis. Negative for fever.  HENT: Negative for sore throat.   Eyes: Negative for visual disturbance.  Respiratory: Positive for shortness of breath. Negative for hemoptysis.   Cardiovascular: Positive for chest pain and near-syncope.  Gastrointestinal: Positive for abdominal pain, nausea and vomiting.  Genitourinary: Negative for dysuria.  Musculoskeletal: Negative for neck pain.  Skin: Negative for rash.  Neurological: Positive for dizziness and weakness. Negative for headaches.     Physical Exam Updated Vital Signs BP 123/74 (BP Location: Right Arm)   Pulse 82   Temp (!) 97.2 F (36.2 C) (Oral)   Resp 16   SpO2 98%   Physical Exam  Constitutional: He appears well-developed and well-nourished.  HENT:  Head: Normocephalic and atraumatic.  Eyes: Conjunctivae are normal.  Neck: Neck supple.  Cardiovascular: Normal rate, regular rhythm and normal pulses.  No murmur heard. Pulmonary/Chest: Effort normal and breath sounds normal. No respiratory distress.  Abdominal: Soft. There is no tenderness.  Musculoskeletal: He exhibits no edema.       Right lower leg: He exhibits no tenderness and no edema.       Left lower leg: He exhibits no tenderness and no edema.  Neurological: He is alert.  Skin: Skin is warm and dry. Capillary refill takes less than 2 seconds.  Psychiatric: He  has a normal mood and affect.  Nursing note and vitals reviewed.    ED Treatments / Results  Labs (all labs ordered are listed, but only abnormal results are displayed) Labs Reviewed  COMPREHENSIVE METABOLIC PANEL - Abnormal; Notable for the following components:      Result Value   Glucose, Bld 274 (*)    Total Protein 6.4 (*)    All other components within normal limits  RAPID URINE DRUG SCREEN, HOSP PERFORMED - Abnormal; Notable for the following components:   Opiates POSITIVE (*)    All other components within normal limits  HEMOGLOBIN A1C - Abnormal; Notable for the following components:   Hgb A1c MFr Bld 11.9 (*)    All other components within normal limits  LIPID PANEL - Abnormal; Notable for the following components:   Cholesterol 222 (*)    LDL Cholesterol 154 (*)    All other components within normal limits  BRAIN NATRIURETIC PEPTIDE - Abnormal; Notable for the following components:   B Natriuretic Peptide 132.4 (*)    All other components within normal limits  URINALYSIS, ROUTINE W REFLEX MICROSCOPIC - Abnormal; Notable for the following components:   Glucose, UA >=500 (*)    Ketones, ur 5 (*)    All other components within normal limits  BASIC METABOLIC PANEL - Abnormal; Notable for the following components:   Glucose, Bld 241 (*)    Calcium 8.6 (*)    All other components within normal limits  GLUCOSE, CAPILLARY - Abnormal; Notable for the following components:   Glucose-Capillary 258 (*)    All other components within normal limits  GLUCOSE, CAPILLARY - Abnormal; Notable for the following components:   Glucose-Capillary 148 (*)    All other components within normal limits  GLUCOSE, CAPILLARY - Abnormal; Notable for the following components:   Glucose-Capillary 228 (*)    All other components within normal limits  GLUCOSE, CAPILLARY - Abnormal; Notable for the following components:   Glucose-Capillary 159 (*)    All other components within normal limits    GLUCOSE, CAPILLARY - Abnormal; Notable for the following components:   Glucose-Capillary 218 (*)    All other components within normal limits  CBG MONITORING, ED -  Abnormal; Notable for the following components:   Glucose-Capillary 262 (*)    All other components within normal limits  CBC  LIPASE, BLOOD  TROPONIN I  TROPONIN I  CBC  CREATININE, SERUM  MAGNESIUM  PHOSPHORUS  CBC  TROPONIN I  I-STAT TROPONIN, ED  I-STAT TROPONIN, ED    EKG EKG Interpretation  Date/Time:  Friday March 09 2018 09:21:29 EDT Ventricular Rate:  82 PR Interval:  174 QRS Duration: 88 QT Interval:  374 QTC Calculation: 436 R Axis:   33 Text Interpretation:  Normal sinus rhythm Cannot rule out Anterior infarct , age undetermined ST & T wave abnormality, consider inferolateral ischemia Abnormal ECG new inferolateral changes c/w 7/19 Confirmed by Aletta Edouard [phone]) on 03/09/2018 9:32:32 AM   Radiology Dg Chest Port 1 View  Result Date: 03/09/2018 CLINICAL DATA:  Patient reports sob, and chest pains since last night. Not exerting self at onset. Hx of cad, diabetes controlled with insulin, prior MI, htn controlled with medication, nonischemic cardiomyopathy. Hx of heart cath and coronary angiography 08/25/2017. Non smoker. EXAM: PORTABLE CHEST 1 VIEW COMPARISON:  01/25/2018 FINDINGS: The heart size and mediastinal contours are within normal limits. Both lungs are clear. No pleural effusion or pneumothorax. The visualized skeletal structures are unremarkable. IMPRESSION: No active disease. Electronically Signed   By: Lajean Manes M.D.   On: 03/09/2018 10:17    Procedures Procedures (including critical care time)  Medications Ordered in ED Medications  aspirin chewable tablet 324 mg (has no administration in time range)  ondansetron (ZOFRAN) injection 4 mg (has no administration in time range)  morphine 4 MG/ML injection 4 mg (has no administration in time range)  sodium chloride 0.9 % bolus  1,000 mL (has no administration in time range)     Initial Impression / Assessment and Plan / ED Course  I have reviewed the triage vital signs and the nursing notes.  Pertinent labs & imaging results that were available during my care of the patient were reviewed by me and considered in my medical decision making (see chart for details).  Clinical Course as of Mar 11 1527  Fri Mar 09, 7054  7127 57 year old male with multiple cardiac risk factors here with multiple complaints that started last evening.  His EKG may be suspicious for some ischemia and I reviewed it with interventional cardiology.  They do not see any indication for Cath Lab activation and recommend continuing to work patient up and consult as needed.  For the patient get some aspirin along with morphine and Zofran.  Is getting labs chest x-ray.   [MB]  1101 Patient states his pain is unchanged.  His lab work is fairly unremarkable other than an elevated glucose of 274.  First troponin negative at 0.02.   [MB]  4562 Cath 2/19 -  Prox RCA to Mid RCA lesion is 30% stenosed.  Mid RCA to Dist RCA lesion is 30% stenosed.  Prox Cx to Dist Cx lesion is 20% stenosed.  Mid LAD lesion is 30% stenosed.  There is mild to moderate left ventricular systolic dysfunction.  LV end diastolic pressure is mildly elevated.  The left ventricular ejection fraction is 35-45% by visual estimate.  There is no mitral valve regurgitation.   1. Mild non-obstructive CAD 2. Moderate LV systolic dysfunction. Non-ischemic cardiomyopathy 3. Normal filling pressures.     [MB]  1106 Echo 2/19 - Study Conclusions  - Left ventricle: The cavity size was normal. Wall thickness was   normal. Systolic  function was moderately reduced. The estimated   ejection fraction was in the range of 35% to 40%. Diffuse   hypokinesis. Features are consistent with a pseudonormal left   ventricular filling pattern, with concomitant abnormal relaxation   and  increased filling pressure (grade 2 diastolic dysfunction).   Doppler parameters are consistent with high ventricular filling   pressure. - Mitral valve: Calcified annulus. Mildly thickened leaflets .  Impressions:  - Moderate global reduction in LV systolic function; moderate   diastolic dysfunction.   [MB]  1301 patients second troponin negative.  He still having ongoing pain with no improvement with a GI cocktail.   [MB]  1336 Discussed with tried hospitalist.  They will admit the patient and asked if I can connect with cardiology for a formal consult.  CO2: 26 [MB]    Clinical Course User Index [MB] Hayden Rasmussen, MD     Final Clinical Impressions(s) / ED Diagnoses   Final diagnoses:  Other chest pain  Type 2 diabetes mellitus with diabetic neuropathy, with long-term current use of insulin ([place])  Hyperlipidemia, unspecified hyperlipidemia type  Chronic systolic heart failure ([place])  Essential hypertension  OSA on CPAP  Gastroesophageal reflux disease without esophagitis  Insomnia, unspecified type  Onychomycosis of multiple toenails with type 2 diabetes mellitus and peripheral neuropathy ([facility])  MCI (mild cognitive impairment)  Hx of adenomatous colonic polyps  Other diabetic neurological complication associated with type 2 diabetes mellitus [facility])    ED Discharge Orders    None       Hayden Rasmussen, MD 03/10/18 1529

## 2018-03-09 NOTE — Plan of Care (Signed)
  Problem: Education: Goal: Knowledge of General Education information will improve Description Including pain rating scale, medication(s)/side effects and non-pharmacologic comfort measures Outcome: Completed/Met

## 2018-03-09 NOTE — H&P (Signed)
History and Physical  Calvin [name] KGM:[id number] DOB: 03/02/1961 DOA: 03/09/2018  Referring physician: ER provider PCP: [name], DO  Outpatient Specialists:    Patient coming from: Home  Chief Complaint: Chest pain and nausea and vomiting.  HPI: Patient is a 57 year old African-American male, with past medical history significant for coronary artery disease, arthritis, diabetes mellitus, GERD, MI, hyperlipidemia, hypertension, OSA and combined congestive heart failure.  Echocardiogram done on 12/18/2017 revealed an EF of 35 to 40% and grade 2 diastolic dysfunction.  Patient was admitted with nausea and vomiting and left-sided chest pain.  Chest pain is reported to be intermittent, radiating to the left shoulder, non-pressure-like, but described as pin-like sensation.  2 sets of troponin have been negative.  EKG done today revealed minimal minimal ST segment elevation in lead I and aVL, and new T wave inversion in leads II, III and aVF.  ER physician has discussed EKG findings with the interventional cardiology team, and has also contacted the general cardiology team for consultation.  According to the patient, he has vomited 4 times since yesterday.  The patient has chronic shortness of breath.  No diaphoresis.  Patient will be admitted for further assessment and management.  ED Course: On presentation to the ER, temperature was 97.2, blood pressure of 167/91, heart rate of 83 with respiratory rate of 15.  Chemistry reveals sodium of 137, potassium of 4.2, chloride of 9 9, CO2 26, BUN of 9 9 creatinine of 1.23 with blood sugar of 274.  Troponin has been 0.02 on 2 occasions.  CBC revealed WBC of 7.6, hemoglobin of 15.7, hematocrit of 46, MCV of 89.3 with a platelet count of 208. Pertinent labs: See above EKG: Independently reviewed.  Imaging: independently reviewed.   Review of Systems:  Negative for fever, visual changes, sore throat, rash, new muscle aches, dysuria, bleeding,  abdominal pain.  Past Medical History:  Diagnosis Date  . Adenoidal hypertrophy   . Adenomatous colon polyps 2012  . Arthritis   . CAD (coronary artery disease)    a. cath in 08/2017 showing mild nonobstructive CAD with scattered 20-30% stenosis.   . Diabetes mellitus   . GERD (gastroesophageal reflux disease)   . Heart attack ([place])    While living in [place]  [name] Hx of adenomatous colonic polyps 08/18/2017  . Hyperlipidemia   . Hypertension   . Nonischemic cardiomyopathy ([place])    a. EF 20-25% by echo in 09/2017 with cath showing mild CAD.   [name] Obesity   . Sleep apnea    cpap    Past Surgical History:  Procedure Laterality Date  . COLONOSCOPY  05/13/11   9 adenomas  . FOREARM SURGERY    . MUSCLE BIOPSY    . RIGHT/LEFT HEART CATH AND CORONARY ANGIOGRAPHY N/A 08/25/2017   Procedure: RIGHT/LEFT HEART CATH AND CORONARY ANGIOGRAPHY;  Surgeon: [name], MD;  Location: [facility];  Service: Cardiovascular;  Laterality: N/A;     reports that he has never smoked. He has never used smokeless tobacco. He reports that he does not drink alcohol or use drugs.  No Known Allergies  Family History  Problem Relation Age of Onset  . Heart disease Mother   . Heart attack Mother 38  . Hypertension Mother   . Hyperlipidemia Mother   . Heart disease Father   . Heart attack Father 85  . Hypertension Father   . Hyperlipidemia Father   . Heart attack Sister 81  . Colon cancer Neg Hx   .  Stomach cancer Neg Hx   . Esophageal cancer Neg Hx   . Rectal cancer Neg Hx      Prior to Admission medications   Medication Sig Start Date End Date Taking? Authorizing Provider  aspirin EC 81 MG tablet Take 81 mg by mouth daily.   Yes [provider]  carvedilol (COREG) 3.125 MG tablet Take 1 tablet (3.125 mg total) by mouth 2 (two) times daily with a meal. For blood pressure 10/27/17  Yes [name], DO  cetirizine (ZYRTEC) 10 MG tablet Take 1 tablet (10 mg total) by mouth  daily. 12/28/17  Yes [name], [name], [name]  diphenhydrAMINE (BENADRYL) 25 MG tablet Take 25 mg by mouth as needed for itching.   Yes [provider]  donepezil (ARICEPT) 10 MG tablet Take 1 tablet (10 mg total) by mouth at bedtime. 10/27/17  Yes [name], [name], DO  insulin detemir (LEVEMIR) 100 UNIT/ML injection Inject 30 units subcut daily for diabetes 03/06/18  Yes [name], [name], DO  lisinopril (PRINIVIL,ZESTRIL) 5 MG tablet Take 1 tablet (5 mg total) by mouth daily. For blood pressure and kidney protection 10/27/17  Yes [name], [name], DO  metFORMIN (GLUCOPHAGE) 500 MG tablet Take 2 tablets (1,000 mg total) by mouth 2 (two) times daily with a meal. 10/27/17  Yes [name], DO  nitroGLYCERIN (NITROSTAT) 0.4 MG SL tablet Place 1 tablet (0.4 mg total) under the tongue every 5 (five) minutes as needed for chest pain. 10/27/17  Yes [name], [name], DO  omeprazole (PRILOSEC) 20 MG capsule Take 1 capsule (20 mg total) by mouth daily. 10/27/17  Yes [name], [name], DO  ranitidine (ZANTAC) 150 MG capsule Take 1 capsule (150 mg total) by mouth daily. 12/28/17  Yes [name], [name], [name]  atorvastatin (LIPITOR) 40 MG tablet Take 1 tablet (40 mg total) by mouth daily. Patient not taking: Reported on 01/25/2018 10/27/17 01/25/18  [name], DO  Blood Glucose Monitoring Suppl (TRUE METRIX AIR GLUCOSE METER) w/Device KIT 1 strip 3 (three) times daily by Does not apply route. Use 1 strip to check blood sugar three (3) times daily. Dx:E11.9 05/10/17   [name], DO  Insulin Syringe-Needle U-100 (INSULIN SYRINGE .3CC/29GX1/2") 29G X 1/2" 0.3 ML MISC Inject 1 Syringe 3 (three) times daily as directed. Check blood sugar three times daily. Dx: E11.9 05/10/17   [name], DO    Physical Exam: Vitals:   03/09/18 1139 03/09/18 1200 03/09/18 1215 03/09/18 1230  BP: (!) 163/87 (!) 143/77 (!) 142/77 139/76  Pulse: 80 82 84 82  Resp: (!) 21 18 15 16   Temp:      TempSrc:      SpO2: 100% 100% 100% 100%      Constitutional:  . Appears calm and comfortable Eyes:  . No pallor. No jaundice.  ENMT:  . external ears, nose appear normal Neck:  . Neck is supple. No JVD Respiratory:  . CTA bilaterally, no w/r/r.  . Respiratory effort normal. No retractions or accessory muscle use Cardiovascular:  . E7O3, soft systolic murmur with increased intensity of S2 component of the heart sound.   . No LE extremity edema   Abdomen:  . Abdomen is soft and non tender. Organs are difficult to assess. Neurologic:  . Awake and alert. . Moves all limbs.  Wt Readings from Last 3 Encounters:  11/15/17 73.9 kg  10/27/17 73.5 kg  10/14/17 75.8 kg    I have personally reviewed following labs and imaging studies  Labs on Admission:  CBC: Recent  Labs  Lab 03/09/18 0949  WBC 7.6  HGB 15.7  HCT 46.0  MCV 89.3  PLT 462   Basic Metabolic Panel: Recent Labs  Lab 03/09/18 0949  NA 137  K 4.2  CL 99  CO2 26  GLUCOSE 274*  BUN 9  CREATININE 1.23  CALCIUM 9.5   Liver Function Tests: Recent Labs  Lab 03/09/18 0949  AST 18  ALT 18  ALKPHOS 72  BILITOT 1.0  PROT 6.4*  ALBUMIN 3.7   Recent Labs  Lab 03/09/18 0949  LIPASE 33   No results for input(s): AMMONIA in the last 168 hours. Coagulation Profile: No results for input(s): INR, PROTIME in the last 168 hours. Cardiac Enzymes: No results for input(s): CKTOTAL, CKMB, CKMBINDEX, TROPONINI in the last 168 hours. BNP (last 3 results) No results for input(s): PROBNP in the last 8760 hours. HbA1C: No results for input(s): HGBA1C in the last 72 hours. CBG: No results for input(s): GLUCAP in the last 168 hours. Lipid Profile: No results for input(s): CHOL, HDL, LDLCALC, TRIG, CHOLHDL, LDLDIRECT in the last 72 hours. Thyroid Function Tests: No results for input(s): TSH, T4TOTAL, FREET4, T3FREE, THYROIDAB in the last 72 hours. Anemia Panel: No results for input(s): VITAMINB12, FOLATE, FERRITIN, TIBC, IRON, RETICCTPCT in the last 72  hours. Urine analysis:    Component Value Date/Time   COLORURINE STRAW (A) 10/15/2017 0754   APPEARANCEUR CLEAR 10/15/2017 0754   LABSPEC 1.011 10/15/2017 0754   PHURINE 5.0 10/15/2017 0754   GLUCOSEU >=500 (A) 10/15/2017 0754   HGBUR NEGATIVE 10/15/2017 0754   BILIRUBINUR NEGATIVE 10/15/2017 0754   KETONESUR NEGATIVE 10/15/2017 0754   PROTEINUR NEGATIVE 10/15/2017 0754   UROBILINOGEN 0.2 11/28/2012 1857   NITRITE NEGATIVE 10/15/2017 0754   LEUKOCYTESUR NEGATIVE 10/15/2017 0754   Sepsis Labs: @LABRCNTIP (procalcitonin:4,lacticidven:4) )No results found for this or any previous visit (from the past 240 hour(s)).    Radiological Exams on Admission: Dg Chest Port 1 View  Result Date: 03/09/2018 CLINICAL DATA:  Patient reports sob, and chest pains since last night. Not exerting self at onset. Hx of cad, diabetes controlled with insulin, prior MI, htn controlled with medication, nonischemic cardiomyopathy. Hx of heart cath and coronary angiography 08/25/2017. Non smoker. EXAM: PORTABLE CHEST 1 VIEW COMPARISON:  01/25/2018 FINDINGS: The heart size and mediastinal contours are within normal limits. Both lungs are clear. No pleural effusion or pneumothorax. The visualized skeletal structures are unremarkable. IMPRESSION: No active disease. Electronically Signed   By: Lajean Manes M.D.   On: 03/09/2018 10:17    EKG: Independently reviewed.  Active Problems:   Chest pain   Assessment/Plan Chest pain: -Repeat EKG -Repeat troponin. Continue aspirin.   ECHO Cardiology input is appreciated Check urine drug screening  Nausea and vomiting: Managed supportively. Hydrate patient. Monitor renal function and electrolytes.  Chronic shortness of breath: Check cardiac BNP Follow echocardiogram Patient denies orthopnea or paroxysmal nocturnal dyspnea Patient has history of OSA and combined congestive heart failure  Diabetes mellitus: Sliding scale insulin coverage Check A1c Monitor  closely  Hypertension: Continue to optimize. Continue current medications.  Hyperlipidemia: Check fasting lipid profile.  DVT prophylaxis: Subcutaneous Lovenox Code Status: Full Family Communication:  Disposition Plan: Home eventually Consults called: ER physician discussed with cardiology team Admission status: Observation  Time spent: 65 minutes  Dana Allan, MD  [facility] Pager #: [phone] 7PM-7AM contact night coverage as above   03/09/2018, 2:09 PM

## 2018-03-09 NOTE — ED Notes (Signed)
XR at bedside

## 2018-03-09 NOTE — ED Triage Notes (Signed)
Cp n/v and dizzy since last night

## 2018-03-09 NOTE — ED Notes (Signed)
CBG collected. Result "[id number]." RN, Larena Glassman., notified.

## 2018-03-10 ENCOUNTER — Other Ambulatory Visit (HOSPITAL_COMMUNITY): Payer: Medicare HMO

## 2018-03-10 ENCOUNTER — Encounter (HOSPITAL_COMMUNITY): Payer: Self-pay | Admitting: Internal Medicine

## 2018-03-10 DIAGNOSIS — E1140 Type 2 diabetes mellitus with diabetic neuropathy, unspecified: Secondary | ICD-10-CM | POA: Diagnosis not present

## 2018-03-10 DIAGNOSIS — G4733 Obstructive sleep apnea (adult) (pediatric): Secondary | ICD-10-CM | POA: Diagnosis not present

## 2018-03-10 DIAGNOSIS — I480 Paroxysmal atrial fibrillation: Secondary | ICD-10-CM | POA: Diagnosis not present

## 2018-03-10 DIAGNOSIS — R0789 Other chest pain: Secondary | ICD-10-CM

## 2018-03-10 DIAGNOSIS — I5022 Chronic systolic (congestive) heart failure: Secondary | ICD-10-CM | POA: Diagnosis not present

## 2018-03-10 DIAGNOSIS — E785 Hyperlipidemia, unspecified: Secondary | ICD-10-CM | POA: Diagnosis not present

## 2018-03-10 DIAGNOSIS — I2510 Atherosclerotic heart disease of native coronary artery without angina pectoris: Secondary | ICD-10-CM | POA: Diagnosis not present

## 2018-03-10 DIAGNOSIS — R072 Precordial pain: Secondary | ICD-10-CM

## 2018-03-10 DIAGNOSIS — I110 Hypertensive heart disease with heart failure: Secondary | ICD-10-CM | POA: Diagnosis not present

## 2018-03-10 DIAGNOSIS — Z7982 Long term (current) use of aspirin: Secondary | ICD-10-CM | POA: Diagnosis not present

## 2018-03-10 DIAGNOSIS — R112 Nausea with vomiting, unspecified: Secondary | ICD-10-CM | POA: Diagnosis present

## 2018-03-10 DIAGNOSIS — R079 Chest pain, unspecified: Secondary | ICD-10-CM | POA: Diagnosis not present

## 2018-03-10 HISTORY — DX: Paroxysmal atrial fibrillation: I48.0

## 2018-03-10 LAB — CBC
HCT: 42.9 % (ref 39.0–52.0)
Hemoglobin: 14.6 g/dL (ref 13.0–17.0)
MCH: 30.5 pg (ref 26.0–34.0)
MCHC: 34 g/dL (ref 30.0–36.0)
MCV: 89.6 fL (ref 78.0–100.0)
Platelets: 212 10*3/uL (ref 150–400)
RBC: 4.79 MIL/uL (ref 4.22–5.81)
RDW: 11.8 % (ref 11.5–15.5)
WBC: 7.5 10*3/uL (ref 4.0–10.5)

## 2018-03-10 LAB — URINALYSIS, ROUTINE W REFLEX MICROSCOPIC
Bacteria, UA: NONE SEEN
Bilirubin Urine: NEGATIVE
Glucose, UA: >=500 mg/dL — AB
Hgb urine dipstick: NEGATIVE
Ketones, ur: 5 mg/dL — AB
Leukocytes, UA: NEGATIVE
Nitrite: NEGATIVE
Protein, ur: NEGATIVE mg/dL
Specific Gravity, Urine: 1.023 (ref 1.005–1.030)
pH: 6 (ref 5.0–8.0)

## 2018-03-10 LAB — RAPID URINE DRUG SCREEN, HOSP PERFORMED
Amphetamines: NOT DETECTED
Barbiturates: NOT DETECTED
Benzodiazepines: NOT DETECTED
Cocaine: NOT DETECTED
Opiates: POSITIVE — AB
Tetrahydrocannabinol: NOT DETECTED

## 2018-03-10 LAB — BASIC METABOLIC PANEL
Anion gap: 7 (ref 5–15)
BUN: 7 mg/dL (ref 6–20)
CO2: 26 mmol/L (ref 22–32)
Calcium: 8.6 mg/dL — ABNORMAL LOW (ref 8.9–10.3)
Chloride: 104 mmol/L (ref 98–111)
Creatinine, Ser: 1.07 mg/dL (ref 0.61–1.24)
GFR calc Af Amer: >60 mL/min (ref >60)
GFR calc non Af Amer: >60 mL/min (ref >60)
Glucose, Bld: 241 mg/dL — ABNORMAL HIGH (ref 70–99)
Potassium: 3.8 mmol/L (ref 3.5–5.1)
Sodium: 137 mmol/L (ref 135–145)

## 2018-03-10 LAB — GLUCOSE, CAPILLARY
Glucose-Capillary: 148 mg/dL — ABNORMAL HIGH (ref 70–99)
Glucose-Capillary: 159 mg/dL — ABNORMAL HIGH (ref 70–99)
Glucose-Capillary: 159 mg/dL — ABNORMAL HIGH (ref 70–99)
Glucose-Capillary: 190 mg/dL — ABNORMAL HIGH (ref 70–99)
Glucose-Capillary: 210 mg/dL — ABNORMAL HIGH (ref 70–99)
Glucose-Capillary: 218 mg/dL — ABNORMAL HIGH (ref 70–99)
Glucose-Capillary: 228 mg/dL — ABNORMAL HIGH (ref 70–99)

## 2018-03-10 LAB — TROPONIN I: Troponin I: <0.03 ng/mL (ref <0.03)

## 2018-03-10 MED ORDER — CARVEDILOL 6.25 MG PO TABS
6.2500 mg | ORAL_TABLET | Freq: Two times a day (BID) | ORAL | Status: DC
  Administered 2018-03-10: 6.25 mg via ORAL
  Filled 2018-03-10: qty 1

## 2018-03-10 MED ORDER — ATORVASTATIN CALCIUM 40 MG PO TABS
40.0000 mg | ORAL_TABLET | Freq: Every day | ORAL | Status: DC
  Administered 2018-03-10: 40 mg via ORAL
  Filled 2018-03-10: qty 1

## 2018-03-10 MED ORDER — APIXABAN 5 MG PO TABS
5.0000 mg | ORAL_TABLET | Freq: Two times a day (BID) | ORAL | Status: DC
  Administered 2018-03-10 – 2018-03-11 (×3): 5 mg via ORAL
  Filled 2018-03-10 (×3): qty 1

## 2018-03-10 MED ORDER — ACETAMINOPHEN 325 MG PO TABS
650.0000 mg | ORAL_TABLET | Freq: Four times a day (QID) | ORAL | Status: DC | PRN
  Administered 2018-03-10: 650 mg via ORAL
  Filled 2018-03-10: qty 2

## 2018-03-10 MED ORDER — MAGNESIUM HYDROXIDE 400 MG/5ML PO SUSP
30.0000 mL | Freq: Every day | ORAL | Status: DC | PRN
  Administered 2018-03-10: 30 mL via ORAL
  Filled 2018-03-10: qty 30

## 2018-03-10 MED ORDER — CARVEDILOL 6.25 MG PO TABS: 6.2500 mg | ORAL_TABLET | Freq: Two times a day (BID) | ORAL | 0 refills | Status: DC

## 2018-03-10 MED ORDER — APIXABAN 5 MG PO TABS: 5.0000 mg | ORAL_TABLET | Freq: Two times a day (BID) | ORAL | 0 refills | Status: DC

## 2018-03-10 MED ORDER — ATORVASTATIN CALCIUM 40 MG PO TABS: 40.0000 mg | ORAL_TABLET | Freq: Every day | ORAL | 0 refills | Status: DC

## 2018-03-10 NOTE — Consult Note (Addendum)
Cardiology Consultation:   Patient ID: Calvin [name] MRN: [id number]; DOB: [birth year]  Admit date: 03/09/2018 Date of Consult: 03/10/2018  Primary Care Provider: [name], DO Primary Cardiologist: [name], MD  Primary Electrophysiologist:  None    Patient Profile:   Calvin [name] is a 57 y.o. male with a hx of non obstructive CAD in 08/2017 NICM-dilated and chronic systolic HF who is being seen today for the evaluation of chest pain at the request of Dr. Evangeline Gula.  History of Present Illness:   Mr. Ostrom with above hx -on cath mild nonobstructive coronary artery disease with 30% mid to distal RCA, 20% proximal to distal left circumflex and 30% mid LAD.  Echocardiogram showed severe LV dysfunction with an ejection fraction of 20 to 25%.   Also with poorly controlled DM-2 and HLD, GERD, .  Echo had improved on echo in June this year to 35-40% and G2 DD.  Now presents with chest pain, n/V and dizziness from night before admit.    EKG SR with LVH and T wave inversion inf. Laterally  Though increased in inf. Leads. I personally reviewed.    Tele:  I personally reviewed. Episode a fib.   troponins are all neg 0.02 to <0.03  BNP 132 Na 137, K+ 3.8, glucose 241, Cr 1.07  LDL 154  Currently no chest pain   Past Medical History:  Diagnosis Date  . Adenoidal hypertrophy   . Adenomatous colon polyps 2012  . Arthritis   . Atrial fibrillation ([place])   . CAD (coronary artery disease)    a. cath in 08/2017 showing mild nonobstructive CAD with scattered 20-30% stenosis.   . Diabetes mellitus   . GERD (gastroesophageal reflux disease)   . Heart attack ([place])    While living in [place]  [name] Hx of adenomatous colonic polyps 08/18/2017  . Hyperlipidemia   . Hypertension   . Nonischemic cardiomyopathy ([place])    a. EF 20-25% by echo in 09/2017 with cath showing mild CAD.   [name] Obesity   . Sleep apnea    cpap    Past Surgical History:  Procedure Laterality Date  .  COLONOSCOPY  05/13/11   9 adenomas  . FOREARM SURGERY    . MUSCLE BIOPSY    . RIGHT/LEFT HEART CATH AND CORONARY ANGIOGRAPHY N/A 08/25/2017   Procedure: RIGHT/LEFT HEART CATH AND CORONARY ANGIOGRAPHY;  Surgeon: [name], MD;  Location: [facility];  Service: Cardiovascular;  Laterality: N/A;     Home Medications:  Prior to Admission medications   Medication Sig Start Date End Date Taking? Authorizing Provider  aspirin EC 81 MG tablet Take 81 mg by mouth daily.   Yes [provider]  carvedilol (COREG) 3.125 MG tablet Take 1 tablet (3.125 mg total) by mouth 2 (two) times daily with a meal. For blood pressure 10/27/17  Yes [name], DO  cetirizine (ZYRTEC) 10 MG tablet Take 1 tablet (10 mg total) by mouth daily. 12/28/17  Yes [name], [name], [name]  diphenhydrAMINE (BENADRYL) 25 MG tablet Take 25 mg by mouth as needed for itching.   Yes [provider]  donepezil (ARICEPT) 10 MG tablet Take 1 tablet (10 mg total) by mouth at bedtime. 10/27/17  Yes [name], [name], DO  insulin detemir (LEVEMIR) 100 UNIT/ML injection Inject 30 units subcut daily for diabetes 03/06/18  Yes [name], [name], DO  lisinopril (PRINIVIL,ZESTRIL) 5 MG tablet Take 1 tablet (5 mg total) by mouth daily. For blood pressure and kidney protection 10/27/17  Yes [name], DO  metFORMIN (GLUCOPHAGE) 500 MG tablet Take 2 tablets (1,000 mg total) by mouth 2 (two) times daily with a meal. 10/27/17  Yes [name], DO  nitroGLYCERIN (NITROSTAT) 0.4 MG SL tablet Place 1 tablet (0.4 mg total) under the tongue every 5 (five) minutes as needed for chest pain. 10/27/17  Yes [name], [name], DO  omeprazole (PRILOSEC) 20 MG capsule Take 1 capsule (20 mg total) by mouth daily. 10/27/17  Yes [name], [name], DO  ranitidine (ZANTAC) 150 MG capsule Take 1 capsule (150 mg total) by mouth daily. 12/28/17  Yes [name], [name], [name]  atorvastatin (LIPITOR) 40 MG tablet Take 1 tablet (40 mg total) by mouth  daily. Patient not taking: Reported on 01/25/2018 10/27/17 01/25/18  [name], DO  Blood Glucose Monitoring Suppl (TRUE METRIX AIR GLUCOSE METER) w/Device KIT 1 strip 3 (three) times daily by Does not apply route. Use 1 strip to check blood sugar three (3) times daily. Dx:E11.9 05/10/17   [name], DO  Insulin Syringe-Needle U-100 (INSULIN SYRINGE .3CC/29GX1/2") 29G X 1/2" 0.3 ML MISC Inject 1 Syringe 3 (three) times daily as directed. Check blood sugar three times daily. Dx: E11.9 05/10/17   [name], DO    Inpatient Medications: Scheduled Meds: . aspirin EC  81 mg Oral Daily  . carvedilol  3.125 mg Oral BID WC  . donepezil  10 mg Oral QHS  . enoxaparin (LOVENOX) injection  40 mg Subcutaneous Q24H  . insulin aspart  0-9 Units Subcutaneous Q4H  . lisinopril  5 mg Oral Daily  . loratadine  10 mg Oral Daily   Continuous Infusions: . sodium chloride 75 mL/hr at 03/10/18 0400  . famotidine (PEPCID) IV 20 mg (03/10/18 0911)   PRN Meds: acetaminophen, alum & mag hydroxide-simeth, magnesium hydroxide, ondansetron (ZOFRAN) IV  Allergies:   No Known Allergies  Social History:   Social History   Socioeconomic History  . Marital status: Single    Spouse name: Not on file  . Number of children: 0  . Years of education: Not on file  . Highest education level: Not on file  Occupational History  . Occupation: Disability  Social Needs  . Financial resource strain: Not on file  . Food insecurity:    Worry: Not on file    Inability: Not on file  . Transportation needs:    Medical: Not on file    Non-medical: Not on file  Tobacco Use  . Smoking status: Never Smoker  . Smokeless tobacco: Never Used  Substance and Sexual Activity  . Alcohol use: No  . Drug use: No  . Sexual activity: Yes    Birth control/protection: None  Lifestyle  . Physical activity:    Days per week: Not on file    Minutes per session: Not on file  . Stress: Not on file  Relationships  . Social  connections:    Talks on phone: Not on file    Gets together: Not on file    Attends religious service: Not on file    Active member of club or organization: Not on file    Attends meetings of clubs or organizations: Not on file    Relationship status: Not on file  . Intimate partner violence:    Fear of current or ex partner: Not on file    Emotionally abused: Not on file    Physically abused: Not on file    Forced sexual activity: Not on file  Other Topics Concern  .  Not on file  Social History Narrative   Social History      Diet?       Do you drink/eat things with caffeine? yes      Marital status?       single                             What year were you married?      Do you live in a house, apartment, assisted living, condo, trailer, etc.? yes      Is it one or more stories? One story      How many persons live in your home?      Do you have any pets in your home? (please list) none      Highest level of education completed? graduate      Current or past profession:      Do you exercise?            no                          Type & how often?      Advanced Directives      Do you have a living will? no      Do you have a DNR form?                                  If not, do you want to discuss one? no      Do you have signed POA/HPOA for forms? no      Functional Status      Do you have difficulty bathing or dressing yourself? no      Do you have difficulty preparing food or eating? no      Do you have difficulty managing your medications? no      Do you have difficulty managing your finances? no      Do you have difficulty affording your medications? no    Family History:    Family History  Problem Relation Age of Onset  . Heart disease Mother   . Heart attack Mother 52  . Hypertension Mother   . Hyperlipidemia Mother   . Heart disease Father   . Heart attack Father 99  . Hypertension Father   . Hyperlipidemia Father   . Heart attack  Sister 22  . Colon cancer Neg Hx   . Stomach cancer Neg Hx   . Esophageal cancer Neg Hx   . Rectal cancer Neg Hx      ROS:  Please see the history of present illness.  General:no colds or fevers, no weight changes Skin:no rashes or ulcers HEENT:no blurred vision, no congestion CV:see HPI PUL:see HPI GI:no diarrhea constipation or melena, no indigestion GU:no hematuria, no dysuria MS:no joint pain, no claudication Neuro:no syncope, no lightheadedness Endo:+ diabetes, no thyroid disease  All other ROS reviewed and negative.     Physical Exam/Data:   Vitals:   03/09/18 1925 03/09/18 2109 03/10/18 0603 03/10/18 0907  BP: (!) 156/77 (!) 158/76 (!) 155/89 139/86  Pulse: 82 78 85 80  Resp: 19 20 17    Temp: 97.6 F (36.4 C) 97.7 F (36.5 C) (!) 97.4 F (36.3 C)   TempSrc: Oral Oral Oral   SpO2: 100% 99% 100%   Weight:   73 kg  Height:        Intake/Output Summary (Last 24 hours) at 03/10/2018 0951 Last data filed at 03/10/2018 0400 Gross per 24 hour  Intake 1581.6 ml  Output 300 ml  Net 1281.6 ml   Filed Weights   03/09/18 1827 03/09/18 1831 03/10/18 0603  Weight: 73.9 kg 72.8 kg 73 kg   Body mass index is 25.21 kg/m.   Physical exam HEENT is normal.  Normal eyelids.  Well-developed well-nourished no acute distress. Neck is supple with normal carotid upstroke and no bruits. Chest is clear to auscultation with normal expansion. Cardiovascular reveals regular rate and rhythm with normal S1 and S2.  No murmurs rubs or gallops. Abdominal exam nontender or distended positive bowel sounds no hepatospleno megaly and no masses.  2+ femoral pulses bilaterally.  No bruits. Extremities show no edema. Neurological exam grossly intact.  Relevant CV Studies: Echo 12/18/17 Study Conclusions  - Left ventricle: The cavity size was normal. Wall thickness was   normal. Systolic function was moderately reduced. The estimated   ejection fraction was in the range of 35% to 40%.  Diffuse   hypokinesis. Features are consistent with a pseudonormal left   ventricular filling pattern, with concomitant abnormal relaxation   and increased filling pressure (grade 2 diastolic dysfunction).   Doppler parameters are consistent with high ventricular filling   pressure. - Mitral valve: Calcified annulus. Mildly thickened leaflets .  Impressions:  - Moderate global reduction in LV systolic function; moderate   diastolic dysfunction.  Laboratory Data:  Chemistry Recent Labs  Lab 03/09/18 0949 03/09/18 1849 03/10/18 0345  NA 137  --  137  K 4.2  --  3.8  CL 99  --  104  CO2 26  --  26  GLUCOSE 274*  --  241*  BUN 9  --  7  CREATININE 1.23 1.14 1.07  CALCIUM 9.5  --  8.6*  GFRNONAA >60 >60 >60  GFRAA >60 >60 >60  ANIONGAP 12  --  7    Recent Labs  Lab 03/09/18 0949  PROT 6.4*  ALBUMIN 3.7  AST 18  ALT 18  ALKPHOS 72  BILITOT 1.0   Hematology Recent Labs  Lab 03/09/18 0949 03/09/18 1849 03/10/18 0345  WBC 7.6 7.8 7.5  RBC 5.15 5.22 4.79  HGB 15.7 15.7 14.6  HCT 46.0 47.3 42.9  MCV 89.3 90.6 89.6  MCH 30.5 30.1 30.5  MCHC 34.1 33.2 34.0  RDW 11.8 11.9 11.8  PLT 208 229 212   Cardiac Enzymes Recent Labs  Lab 03/09/18 1500 03/09/18 1727 03/10/18 0820  TROPONINI <0.03 <0.03 <0.03    Recent Labs  Lab 03/09/18 1002 03/09/18 1248  TROPIPOC 0.02 0.02    BNP Recent Labs  Lab 03/09/18 1849  BNP 132.4*     Radiology/Studies:  Dg Chest Port 1 View  Result Date: 03/09/2018 CLINICAL DATA:  Patient reports sob, and chest pains since last night. Not exerting self at onset. Hx of cad, diabetes controlled with insulin, prior MI, htn controlled with medication, nonischemic cardiomyopathy. Hx of heart cath and coronary angiography 08/25/2017. Non smoker. EXAM: PORTABLE CHEST 1 VIEW COMPARISON:  01/25/2018 FINDINGS: The heart size and mediastinal contours are within normal limits. Both lungs are clear. No pleural effusion or pneumothorax. The  visualized skeletal structures are unremarkable. IMPRESSION: No active disease. Electronically Signed   By: Lajean Manes M.D.   On: 03/09/2018 10:17    Assessment and Plan:   1. Chest pain, neg troponin minimal  CAD on cath  2. NICM with improved EF  3. PAF see Dr Jacalyn Lefevre note      For questions or updates, please contact [facility] Please consult [email or web address] for contact info under     Signed, Kirk Ruths, MD  03/10/2018 9:51 AM As above, patient seen and examined.  Briefly he is a 57 year old male with history of nonischemic cardiomyopathy, hypertension, hyperlipidemia, diabetes mellitus, sleep apnea who I am asked to evaluate for chest pain.  Patient underwent cardiac catheterization February 2019.  At that time he was found to have a 30% mid right coronary artery, 20% circumflex and 30% mid LAD.  Ejection fraction 35 to 45%.  Medical therapy recommended.  Most recent echocardiogram June 2019 showed ejection fraction 35 to 40%, moderate diastolic dysfunction.  Patient has had intermittent chest pain since his catheterization.  Patient complains of nausea and vomiting as well as chest pain that is substernal, radiates to left upper extremity and described as pin-like sensation.  Cardiology asked to evaluate.  Patient does have some dyspnea on exertion by his report.  Chest x-ray shows no active disease.  Troponins are normal.  BNP 132.  Hemoglobin 14.6.  Electrocardiogram shows sinus rhythm, slight lateral ST elevation in inferior lateral T wave inversion slightly more prominent compared to previous.  Review of telemetry shows paroxysmal atrial fibrillation.  1 chest pain-symptoms are atypical.  Electrocardiogram may demonstrate slight increased inferior lateral T wave inversion.  However it is similar to previous.  Enzymes are negative.  Recent catheterization revealed nonobstructive coronary disease.  Would not pursue further ischemia evaluation.  2 paroxysmal atrial  fibrillation-patient is noted to have paroxysmal atrial fibrillation on telemetry.  He had a recent echocardiogram that showed ejection fraction 30 to 40%.  Recent TSH normal.  Increase carvedilol to 6.25 mg twice daily. CHADSvasc 4.  Add apixaban 5 mg twice daily.  3 nonischemic cardiomyopathy-plan to increase carvedilol 6.25 mg twice daily and continue lisinopril.  These medications have not been titrated in the office as blood pressure felt to be borderline.  However pressure here is high.  We will follow and increase medications as tolerated.  4 history of nonobstructive coronary disease-discontinue aspirin given need for apixaban.  Add Lipitor 40 mg daily.  Check lipids and liver in 4 weeks.  5 hypertension-blood pressure is elevated.  Increase carvedilol and adjust regimen as needed.  Kirk Ruths, MD

## 2018-03-10 NOTE — Progress Notes (Signed)
PROGRESS NOTE    Calvin [name]  OFB:[id number] DOB: [birth year] DOA: 03/09/2018 PCP: [name], DO (Confirm with patient/family/NH records and if not entered, this HAS to be entered at [name] point of entry. "No PCP" if truly none.)   Brief Narrative:Patient is a 57 year old African-American male, with past medical history significant for coronary artery disease, arthritis, diabetes mellitus, GERD, MI, hyperlipidemia, hypertension, OSA and combined congestive heart failure.  Echocardiogram done on 12/18/2017 revealed an EF of 35 to 40% and grade 2 diastolic dysfunction.  Patient was admitted with nausea and vomiting and left-sided chest pain.    Assessment & Plan:   Principal Problem:   Chest pain Active Problems:   AF (paroxysmal atrial fibrillation) (HCC)   DM (diabetes mellitus) (HCC)   HLD (hyperlipidemia)   Chronic systolic heart failure (HCC)   Essential hypertension   OSA on CPAP   Diabetic neuropathy (HCC)   Nausea and vomiting  Chest pain: Symptoms are atypical.  Enzymes are negative.  Patient has had a recent cardiac catheterization and do not believe that further ischemic evaluation at this point is warranted.  Appreciate cardiology input.  Paroxysmal atrial fibrillation: This is a new diagnosis.  Appreciate cardiology input.  I have started apixaban 5 mg twice daily given chads VASC 2 score of 4.  A recent TSH was normal.  Echocardiogram has improved as recently as June to 30 to 40% ejection fraction.  Patient has a history of nonischemic cardiomyopathy.  Given his rates carb D[place]lo will increase to 6.25 mg twice a day.   Nausea and vomiting: Managed supportively. Hydrate patient. Monitor renal function and electrolytes.  Chronic shortness of breath: Related to nonischemic cardiomyopathy.  Echocardiogram was discontinued this morning as patient has recently had one.  Patient also with sleep apnea for which he uses CPAP.  Diabetes mellitus: Sliding scale  insulin coverage Hemoglobin A1c is grossly elevated at 11.9 suggesting a mean plasma glucose of 294.  Had an extensive discussion with the patient regarding dietary indiscretion and the need for close monitoring of glucoses as well as a significantly improved diet to include more plant-based foods, fewer processed foods, no preservatives, no additives, organic based foods, grass fed beef, avoidance of soy, dairy, and gluten to decrease inflammation.  Patient would benefit from evaluation by a nutritionist.  Hypertension: Continue to optimize. Continue current medications.  Hyperlipidemia: Cholesterol grossly elevated at 222.  Triglycerides acceptable at 52 HDL acceptable at 58.  LDL is elevated at 154.  States he has not been taking his atorvastatin at home.  Him the importance of taking this medication.  DVT prophylaxis: Now on apixaban; enoxaparin discontinued. Code Status: Full Family Communication:  Disposition Plan: Home eventually Consults called: ER physician discussed with cardiology team Admission status: Observation    Consultants:   Dr. Stanford Breed from cardiology     Subjective: 57 year old man who has not been taking his statin and has been having significant dietary indiscretion regarding his diabetes type 2 presents with nausea vomiting and left-sided chest pain.  Medications are being adjusted.  Has had a recent echocardiogram so I discontinued the one that was ordered.  He reported continued chest pain and medications have been adjusted.  Cardiology input very much appreciated.  Patient has ruled out for myocardial infarction  Objective: Vitals:   03/09/18 1925 03/09/18 2109 03/10/18 0603 03/10/18 0907  BP: (!) 156/77 (!) 158/76 (!) 155/89 139/86  Pulse: 82 78 85 80  Resp: 19 20 17    Temp: 97.6 F (36.4  C) 97.7 F (36.5 C) (!) 97.4 F (36.3 C)   TempSrc: Oral Oral Oral   SpO2: 100% 99% 100%   Weight:   73 kg   Height:        Intake/Output Summary (Last  24 hours) at 03/10/2018 1315 Last data filed at 03/10/2018 1101 Gross per 24 hour  Intake 1095.82 ml  Output 300 ml  Net 795.82 ml   Filed Weights   03/09/18 1827 03/09/18 1831 03/10/18 0603  Weight: 73.9 kg 72.8 kg 73 kg    Examination:  General exam: Appears calm and comfortable  Respiratory system: Clear to auscultation. Respiratory effort normal. Cardiovascular system: S1 & S2 heard, RRR. No JVD, murmurs, rubs, gallops or clicks. No pedal edema. Gastrointestinal system: Abdomen is nondistended, soft and nontender. No organomegaly or masses felt. Normal bowel sounds heard. Central nervous system: Alert and oriented. No focal neurological deficits. Extremities: Symmetric 5 x 5 power. Skin: No rashes, lesions or ulcers Psychiatry: Judgement and insight appear normal. Mood & affect appropriate.     Data Reviewed: I have personally reviewed following labs and imaging studies  CBC: Recent Labs  Lab 03/09/18 0949 03/09/18 1849 03/10/18 0345  WBC 7.6 7.8 7.5  HGB 15.7 15.7 14.6  HCT 46.0 47.3 42.9  MCV 89.3 90.6 89.6  PLT 208 229 481   Basic Metabolic Panel: Recent Labs  Lab 03/09/18 0949 03/09/18 1849 03/10/18 0345  NA 137  --  137  K 4.2  --  3.8  CL 99  --  104  CO2 26  --  26  GLUCOSE 274*  --  241*  BUN 9  --  7  CREATININE 1.23 1.14 1.07  CALCIUM 9.5  --  8.6*  MG  --  1.9  --   PHOS  --  3.5  --    GFR: Estimated Creatinine Clearance: 71.2 mL/min (by C-G formula based on SCr of 1.07 mg/dL). Liver Function Tests: Recent Labs  Lab 03/09/18 0949  AST 18  ALT 18  ALKPHOS 72  BILITOT 1.0  PROT 6.4*  ALBUMIN 3.7   Recent Labs  Lab 03/09/18 0949  LIPASE 33   No results for input(s): AMMONIA in the last 168 hours. Coagulation Profile: No results for input(s): INR, PROTIME in the last 168 hours. Cardiac Enzymes: Recent Labs  Lab 03/09/18 1500 03/09/18 1727 03/10/18 0820  TROPONINI <0.03 <0.03 <0.03   BNP (last 3 results) No results for  input(s): PROBNP in the last 8760 hours. HbA1C: Recent Labs    03/09/18 0949  HGBA1C 11.9*   CBG: Recent Labs  Lab 03/09/18 1951 03/10/18 0020 03/10/18 0421 03/10/18 0725 03/10/18 1124  GLUCAP 258* 148* 228* 159* 218*   Lipid Profile: Recent Labs    03/09/18 1500  CHOL 222*  HDL 58  LDLCALC 154*  TRIG 52  CHOLHDL 3.8    No results found for this or any previous visit (from the past 240 hour(s)).       Radiology Studies: Dg Chest Port 1 View  Result Date: 03/09/2018 CLINICAL DATA:  Patient reports sob, and chest pains since last night. Not exerting self at onset. Hx of cad, diabetes controlled with insulin, prior MI, htn controlled with medication, nonischemic cardiomyopathy. Hx of heart cath and coronary angiography 08/25/2017. Non smoker. EXAM: PORTABLE CHEST 1 VIEW COMPARISON:  01/25/2018 FINDINGS: The heart size and mediastinal contours are within normal limits. Both lungs are clear. No pleural effusion or pneumothorax. The visualized skeletal structures are unremarkable.  IMPRESSION: No active disease. Electronically Signed   By: Lajean Manes M.D.   On: 03/09/2018 10:17        Scheduled Meds: . apixaban  5 mg Oral BID  . atorvastatin  40 mg Oral q1800  . carvedilol  6.25 mg Oral BID WC  . donepezil  10 mg Oral QHS  . insulin aspart  0-9 Units Subcutaneous Q4H  . lisinopril  5 mg Oral Daily  . loratadine  10 mg Oral Daily   Continuous Infusions:   LOS: 0 days    Time spent: 60 minutes    Lady Deutscher, MD, FACP [facility] Pager [phone]  If 7PM-7AM, please contact night-coverage [email or web address] Password [email or web address] 03/10/2018, 1:15 PM

## 2018-03-10 NOTE — Discharge Instructions (Signed)

## 2018-03-11 DIAGNOSIS — R072 Precordial pain: Secondary | ICD-10-CM | POA: Diagnosis not present

## 2018-03-11 DIAGNOSIS — R0789 Other chest pain: Secondary | ICD-10-CM | POA: Diagnosis not present

## 2018-03-11 DIAGNOSIS — I480 Paroxysmal atrial fibrillation: Secondary | ICD-10-CM | POA: Diagnosis not present

## 2018-03-11 LAB — GLUCOSE, CAPILLARY
Glucose-Capillary: 194 mg/dL — ABNORMAL HIGH (ref 70–99)
Glucose-Capillary: 232 mg/dL — ABNORMAL HIGH (ref 70–99)
Glucose-Capillary: 180 mg/dL — ABNORMAL HIGH (ref 70–99)

## 2018-03-11 MED ORDER — CARVEDILOL 12.5 MG PO TABS
12.5000 mg | ORAL_TABLET | Freq: Two times a day (BID) | ORAL | Status: DC
  Administered 2018-03-11: 12.5 mg via ORAL
  Filled 2018-03-11: qty 1

## 2018-03-11 MED ORDER — INSULIN NPH ISOPHANE & REGULAR (70-30) 100 UNIT/ML ~~LOC~~ SUSP: SUBCUTANEOUS | 3 refills | Status: DC

## 2018-03-11 NOTE — Progress Notes (Signed)
Progress Note  Patient Name: Calvin [name] Date of Encounter: 58/02/2018  Primary Cardiologist: [name], MD   Subjective   No chest pain or dyspnea  Inpatient Medications    Scheduled Meds: . apixaban  5 mg Oral BID  . atorvastatin  40 mg Oral q1800  . carvedilol  6.25 mg Oral BID WC  . donepezil  10 mg Oral QHS  . insulin aspart  0-9 Units Subcutaneous Q4H  . lisinopril  5 mg Oral Daily  . loratadine  10 mg Oral Daily   Continuous Infusions:  PRN Meds: acetaminophen, alum & mag hydroxide-simeth, magnesium hydroxide, ondansetron (ZOFRAN) IV   Vital Signs    Vitals:   03/10/18 1730 03/10/18 2022 03/10/18 2342 03/11/18 0600  BP: (!) 167/99 133/75 (!) 154/83 (!) 141/86  Pulse: 79 77 77 80  Resp:  18 18 18   Temp:  97.9 F (36.6 C) (!) 97.5 F (36.4 C) 97.6 F (36.4 C)  TempSrc:  Oral Oral Oral  SpO2:  100% 100% 100%  Weight:    68.6 kg  Height:        Intake/Output Summary (Last 24 hours) at 03/11/2018 0749 Last data filed at 03/10/2018 2000 Gross per 24 hour  Intake 736.22 ml  Output -  Net 736.22 ml   Filed Weights   03/09/18 1831 03/10/18 0603 03/11/18 0600  Weight: 72.8 kg 73 kg 68.6 kg    Telemetry    Sinus- Personally Reviewed   Physical Exam   GEN: No acute distress.   Neck: No JVD Cardiac: RRR, no murmurs, rubs, or gallops.  Respiratory: Clear to auscultation bilaterally. GI: Soft, nontender, non-distended  MS: No edema Neuro:  Nonfocal  Psych: Normal affect   Labs    Chemistry Recent Labs  Lab 03/09/18 0949 03/09/18 1849 03/10/18 0345  NA 137  --  137  K 4.2  --  3.8  CL 99  --  104  CO2 26  --  26  GLUCOSE 274*  --  241*  BUN 9  --  7  CREATININE 1.23 1.14 1.07  CALCIUM 9.5  --  8.6*  PROT 6.4*  --   --   ALBUMIN 3.7  --   --   AST 18  --   --   ALT 18  --   --   ALKPHOS 72  --   --   BILITOT 1.0  --   --   GFRNONAA >60 >60 >60  GFRAA >60 >60 >60  ANIONGAP 12  --  7     Hematology Recent Labs    Lab 03/09/18 0949 03/09/18 1849 03/10/18 0345  WBC 7.6 7.8 7.5  RBC 5.15 5.22 4.79  HGB 15.7 15.7 14.6  HCT 46.0 47.3 42.9  MCV 89.3 90.6 89.6  MCH 30.5 30.1 30.5  MCHC 34.1 33.2 34.0  RDW 11.8 11.9 11.8  PLT 208 229 212    Cardiac Enzymes Recent Labs  Lab 03/09/18 1500 03/09/18 1727 03/10/18 0820  TROPONINI <0.03 <0.03 <0.03    Recent Labs  Lab 03/09/18 1002 03/09/18 1248  TROPIPOC 0.02 0.02     BNP Recent Labs  Lab 03/09/18 1849  BNP 132.4*      Radiology    Dg Chest Port 1 View  Result Date: 03/09/2018 CLINICAL DATA:  Patient reports sob, and chest pains since last night. Not exerting self at onset. Hx of cad, diabetes controlled with insulin, prior MI, htn controlled with medication, nonischemic cardiomyopathy. Hx of  heart cath and coronary angiography 08/25/2017. Non smoker. EXAM: PORTABLE CHEST 1 VIEW COMPARISON:  01/25/2018 FINDINGS: The heart size and mediastinal contours are within normal limits. Both lungs are clear. No pleural effusion or pneumothorax. The visualized skeletal structures are unremarkable. IMPRESSION: No active disease. Electronically Signed   By: Lajean Manes M.D.   On: 03/09/2018 10:17    Patient Profile     57 year old male with history of nonischemic cardiomyopathy, hypertension, hyperlipidemia, diabetes mellitus, sleep apnea who I am asked to evaluate for chest pain.  Patient underwent cardiac catheterization February 2019.  At that time he was found to have a 30% mid right coronary artery, 20% circumflex and 30% mid LAD.  Ejection fraction 35 to 45%.  Medical therapy recommended.  Most recent echocardiogram June 2019 showed ejection fraction 35 to 40%, moderate diastolic dysfunction.  Patient has had intermittent chest pain since his catheterization. Review of telemetry shows paroxysmal atrial fibrillation.  Assessment & Plan    1 chest pain-as outlined previously symptoms are felt to be atypical.  Enzymes negative.  Recent  catheterization without obstructive coronary disease.  We will not pursue further ischemia evaluation.    2 paroxysmal atrial fibrillation-patient remains in sinus rhythm this morning.  I will increase carvedilol to 12.5 mg twice daily.  Continue apixaban 5 mg twice daily.  He had a recent echocardiogram that showed ejection fraction 30 to 40%.  Recent TSH normal.    3 nonischemic cardiomyopathy-plan to continue ACE inhibitor and increase carvedilol to 12.5 mg twice daily.  4 history of nonobstructive coronary disease-continue statin.  No aspirin given need for anticoagulation.  5 hypertension-blood pressure is mildly elevated.  Increase carvedilol as outlined.  Patient can be discharged from a cardiac standpoint.  Follow-up with Dr. Angelena Form 4 to 6 weeks.  [facility] will sign off.   Medication Recommendations: Present medications as listed in MAR but increase carvedilol to 12.5 mg twice daily. Other recommendations (labs, testing, etc): Check hemoglobin, potassium, renal function, lipids and liver in 4 weeks. Follow up as an outpatient: Dr. Angelena Form 4 to 6 weeks.  For questions or updates, please contact [place] Please consult [email or web address] for contact info under        Signed, Kirk Ruths, MD  03/11/2018, 7:49 AM

## 2018-03-11 NOTE — Care Management Note (Signed)
Case Management Note  Patient Details  Name: Karen Kinnard MRN: [id number] Date of Birth: 1960/11/09  Subjective/Objective:               A fib flutter     Action/Plan:  Spoke w patient at bedside. Provided w 30 day Eliquis card. Patient verbalized understanding for use. Patient understands to check with pharmacist at pick up what cost will be for next month. Consulted for help affording Levemir. Unfortunately with patient's insurance there is no assistance. He states he will be able to get it Wednesday when he gets paid.  Spoke with nurse, suggested she ask MD if patient could switch to 70/30 as this is ~$25 a vial at [facility]. Spoke w patient about meter. Informed him if prescribed one is too expensive, [facility] sells them at pharmacy for ~$20 and it may end up being a more affordable option.    Expected Discharge Date:  03/11/18               Expected Discharge Plan:  Home/Self Care  In-House Referral:     Discharge planning Services  CM Consult, Medication Assistance  Post Acute Care Choice:    Choice offered to:     DME Arranged:    DME Agency:     HH Arranged:    HH Agency:     Status of Service:  Completed, signed off  If discussed at [employer] of Stay Meetings, dates discussed:    Additional Comments:  Carles Collet, RN 03/11/2018, 12:02 PM

## 2018-03-11 NOTE — Discharge Summary (Signed)
Physician Discharge Summary  Calvin [name] ZOX:[id number] DOB: Jun 12, 1961 DOA: 03/09/2018  PCP: [name], DO  Admit date: 03/09/2018 Discharge date: 03/11/2018  Admitted From: Home Disposition: Home  Recommendations for Outpatient Follow-up:  1. Follow up with PCP in 3-4 days 2. Make and keep follow-up appointment with cardiologist in 4 to 6 weeks 3. Take Xarelto as prescribed 5 mg twice daily until follow-up appointment with cardiologist then as directed by cardiology. 4. Speak with Dr. [name] about arranging outpatient diabetes education  Home Health: No Equipment/Devices: None  Discharge Condition: Stable CODE STATUS: Full code Diet recommendation: Heart Healthy / Carb Modified  Brief/Interim Summary: Patient is a 57 year old African-American male, with past medical history significant for coronary artery disease, arthritis, diabetes mellitus, GERD, MI, hyperlipidemia, hypertension, OSA and combined congestive heart failure. Echocardiogram done on 12/18/2017 revealed an EF of 35 to 40% and grade 2 diastolic dysfunction. Patient was admitted with nausea and vomiting and left-sided chest pain.   1 chest pain-as outlined previously symptoms are felt to be atypical.  Enzymes negative.  Recent catheterization without obstructive coronary disease.  We will not pursue further ischemia evaluation.    2 paroxysmal atrial fibrillation-patient remains in sinus rhythm.  Carvedilol increased to 12.5 mg twice daily.  Continue apixaban 5 mg twice daily.  He had a recent echocardiogram that showed ejection fraction 30 to 40%. Recent TSH normal.   3 nonischemic cardiomyopathy-plan to continue ACE inhibitor and increase carvedilol to 12.5 mg twice daily.  4history of nonobstructive coronary disease-continue statin.  No aspirin given need for anticoagulation.  5 hypertension-blood pressure is mildly elevated.  Increase carvedilol as outlined.  6.  Diabetes type 2: Patient unable to  afford his insulin.  We are switching him to 70 3030 units in the morning and 15 units in the p.m.  Case management can assist him with that.  7.  Hyperlipidemia: Patient admits to not taking his medication at home.  I have encouraged him to take his atorvastatin.  Patient has reached maximal benefit of hospitalization.  Discharge diagnosis, prognosis, plans, follow-up, medications and treatments discussed with the patient(or responsible party) and is in agreement with the plans as described.  Patient is stable for discharge.  Discharge Diagnoses:  Principal Problem:   Chest pain Active Problems:   AF (paroxysmal atrial fibrillation) (HCC)   DM (diabetes mellitus) (HCC)   HLD (hyperlipidemia)   Chronic systolic heart failure (HCC)   Essential hypertension   OSA on CPAP   Diabetic neuropathy (HCC)   Nausea and vomiting    Discharge Instructions  Discharge Instructions    (HEART FAILURE PATIENTS) Call MD:  Anytime you have any of the following symptoms: 1) 3 pound weight gain in 24 hours or 5 pounds in 1 week 2) shortness of breath, with or without a dry hacking cough 3) swelling in the hands, feet or stomach 4) if you have to sleep on extra pillows at night in order to breathe.   Complete by:  As directed    Call MD for:  extreme fatigue   Complete by:  As directed    Call MD for:  persistant dizziness or light-headedness   Complete by:  As directed    Call MD for:  persistant nausea and vomiting   Complete by:  As directed    Diet - low sodium heart healthy   Complete by:  As directed    Diet - low sodium heart healthy   Complete by:  As directed  Diet Carb Modified   Complete by:  As directed    Discharge instructions   Complete by:  As directed    Make and keep FU appt with Dr. Glennie Hawk from cardiology to be seen in 4-6 weeks See Dr [name] in the next 4-5 days for hospital follow up Take Eliquis 24m twice daily without fail until follow up with Cardiology, then as  directed by them   Increase activity slowly   Complete by:  As directed    Increase activity slowly   Complete by:  As directed      Allergies as of 03/11/2018   No Known Allergies     Medication List    STOP taking these medications   aspirin EC 81 MG tablet   insulin detemir 100 UNIT/ML injection Commonly known as:  LEVEMIR     TAKE these medications   apixaban 5 MG Tabs tablet Commonly known as:  ELIQUIS Take 1 tablet (5 mg total) by mouth 2 (two) times daily.   atorvastatin 40 MG tablet Commonly known as:  LIPITOR Take 1 tablet (40 mg total) by mouth daily at 6 PM. What changed:  when to take this   carvedilol 6.25 MG tablet Commonly known as:  COREG Take 1 tablet (6.25 mg total) by mouth 2 (two) times daily with a meal. What changed:    medication strength  how much to take  additional instructions   cetirizine 10 MG tablet Commonly known as:  ZYRTEC Take 1 tablet (10 mg total) by mouth daily.   diphenhydrAMINE 25 MG tablet Commonly known as:  BENADRYL Take 25 mg by mouth as needed for itching.   donepezil 10 MG tablet Commonly known as:  ARICEPT Take 1 tablet (10 mg total) by mouth at bedtime.   insulin NPH-regular Human (70-30) 100 UNIT/ML injection Commonly known as:  NOVOLIN 70/30 30 units SQ in the am and 15 units SQ in the pm   INSULIN SYRINGE .3CC/29GX1/2" 29G X 1/2" 0.3 ML Misc Inject 1 Syringe 3 (three) times daily as directed. Check blood sugar three times daily. Dx: E11.9   lisinopril 5 MG tablet Commonly known as:  PRINIVIL,ZESTRIL Take 1 tablet (5 mg total) by mouth daily. For blood pressure and kidney protection   metFORMIN 500 MG tablet Commonly known as:  GLUCOPHAGE Take 2 tablets (1,000 mg total) by mouth 2 (two) times daily with a meal.   nitroGLYCERIN 0.4 MG SL tablet Commonly known as:  NITROSTAT Place 1 tablet (0.4 mg total) under the tongue every 5 (five) minutes as needed for chest pain.   omeprazole 20 MG  capsule Commonly known as:  PRILOSEC Take 1 capsule (20 mg total) by mouth daily.   ranitidine 150 MG capsule Commonly known as:  ZANTAC Take 1 capsule (150 mg total) by mouth daily.   TRUE METRIX AIR GLUCOSE METER w/Device Kit 1 strip 3 (three) times daily by Does not apply route. Use 1 strip to check blood sugar three (3) times daily. Dx:E11.9      Follow-up Information    CGildardo Cranker DO. Schedule an appointment as soon as possible for a visit in 4 day(s).   Specialty:  Internal Medicine Contact information: [phone]       MBurnell Blanks MD Follow up in 4 week(s).   Specialty:  Cardiology Why:  the office will call with date and time  Contact information: [address]  [address] [phone]          No Known Allergies  Consultations: Dr. Stanford Breed from cardiology  Procedures/Studies: Dg Chest Port 1 View  Result Date: 03/09/2018 CLINICAL DATA:  Patient reports sob, and chest pains since last night. Not exerting self at onset. Hx of cad, diabetes controlled with insulin, prior MI, htn controlled with medication, nonischemic cardiomyopathy. Hx of heart cath and coronary angiography 08/25/2017. Non smoker. EXAM: PORTABLE CHEST 1 VIEW COMPARISON:  01/25/2018 FINDINGS: The heart size and mediastinal contours are within normal limits. Both lungs are clear. No pleural effusion or pneumothorax. The visualized skeletal structures are unremarkable. IMPRESSION: No active disease. Electronically Signed   By: Lajean Manes M.D.   On: 03/09/2018 10:17     Subjective: No new complaints  Discharge Exam: Vitals:   03/10/18 2342 03/11/18 0600  BP: (!) 154/83 (!) 141/86  Pulse: 77 80  Resp: 18 18  Temp: (!) 97.5 F (36.4 C) 97.6 F (36.4 C)  SpO2: 100% 100%   Vitals:   03/10/18 1730 03/10/18 2022 03/10/18 2342 03/11/18 0600  BP: (!) 167/99 133/75 (!) 154/83 (!) 141/86  Pulse: 79 77 77 80   Resp:  18 18 18   Temp:  97.9 F (36.6 C) (!) 97.5 F (36.4 C) 97.6 F (36.4 C)  TempSrc:  Oral Oral Oral  SpO2:  100% 100% 100%  Weight:    68.6 kg  Height:        General: Pt is alert, awake, not in acute distress Cardiovascular: RRR, S1/S2 +, no rubs, no gallops Respiratory: CTA bilaterally, no wheezing, no rhonchi Abdominal: Soft, NT, ND, bowel sounds + Extremities: no edema, no cyanosis    The results of significant diagnostics from this hospitalization (including imaging, microbiology, ancillary and laboratory) are listed below for reference.     Microbiology: No results found for this or any previous visit (from the past 240 hour(s)).   Labs: BNP (last 3 results) Recent Labs    10/14/17 1444 03/09/18 1849  BNP 130.9* 654.6*   Basic Metabolic Panel: Recent Labs  Lab 03/09/18 0949 03/09/18 1849 03/10/18 0345  NA 137  --  137  K 4.2  --  3.8  CL 99  --  104  CO2 26  --  26  GLUCOSE 274*  --  241*  BUN 9  --  7  CREATININE 1.23 1.14 1.07  CALCIUM 9.5  --  8.6*  MG  --  1.9  --   PHOS  --  3.5  --    Liver Function Tests: Recent Labs  Lab 03/09/18 0949  AST 18  ALT 18  ALKPHOS 72  BILITOT 1.0  PROT 6.4*  ALBUMIN 3.7   Recent Labs  Lab 03/09/18 0949  LIPASE 33   No results for input(s): AMMONIA in the last 168 hours. CBC: Recent Labs  Lab 03/09/18 0949 03/09/18 1849 03/10/18 0345  WBC 7.6 7.8 7.5  HGB 15.7 15.7 14.6  HCT 46.0 47.3 42.9  MCV 89.3 90.6 89.6  PLT 208 229 212   Cardiac Enzymes: Recent Labs  Lab 03/09/18 1500 03/09/18 1727 03/10/18 0820  TROPONINI <0.03 <0.03 <0.03   BNP: Invalid input(s): POCBNP CBG: Recent Labs  Lab 03/10/18 2016 03/10/18 2340 03/11/18 0601 03/11/18 0733 03/11/18 1158  GLUCAP 159* 190* 194* 232* 180*   Hgb A1c Recent Labs    03/09/18 0949  HGBA1C 11.9*   Lipid Profile Recent Labs    03/09/18 1500  CHOL 222*  HDL  58  LDLCALC 154*  TRIG 52  CHOLHDL 3.8   Urinalysis     Component Value Date/Time   COLORURINE YELLOW 03/10/2018 0436   APPEARANCEUR CLEAR 03/10/2018 0436   LABSPEC 1.023 03/10/2018 0436   PHURINE 6.0 03/10/2018 0436   GLUCOSEU >=500 (A) 03/10/2018 0436   HGBUR NEGATIVE 03/10/2018 0436   BILIRUBINUR NEGATIVE 03/10/2018 0436   KETONESUR 5 (A) 03/10/2018 0436   PROTEINUR NEGATIVE 03/10/2018 0436   UROBILINOGEN 0.2 11/28/2012 1857   NITRITE NEGATIVE 03/10/2018 0436   LEUKOCYTESUR NEGATIVE 03/10/2018 0436    Time coordinating discharge: 42 minutes  SIGNED:   Lady Deutscher, MD  FACP [facility] 03/11/2018, 12:06 PM Pager   If 7PM-7AM, please contact night-coverage [email or web address] Password [email or web address]

## 2018-03-12 ENCOUNTER — Telehealth: Payer: Self-pay

## 2018-03-12 NOTE — Telephone Encounter (Signed)
error 

## 2018-03-12 NOTE — Telephone Encounter (Signed)
I have made the 2nd attempt to contact the patient or family member in charge, in order to follow up from recently being discharged from the hospital. I left a message on voicemail but I will make another attempt at a different time.  

## 2018-03-12 NOTE — Telephone Encounter (Signed)
I have made the 1st attempt to contact the patient or family member in charge, in order to follow up from recently being discharged from the hospital. I left a message on voicemail but I will make another attempt at a different time.  

## 2018-03-20 ENCOUNTER — Encounter: Payer: Self-pay | Admitting: Physician Assistant

## 2018-03-20 NOTE — Progress Notes (Addendum)
Cardiology Office Note    Date:  03/21/2018  ID:  [name], DOB 14-Jun-1961, MRN [id number] PCP:  [name], DO  Cardiologist:  [name], MD   Chief Complaint: f/u atrial fib  History of Present Illness:  Cal[facility] [name] is a 57 y.o. male with history of frequent chest pain, nonischemic cardiomyopathy, PAF, hypertension, hyperlipidemia, diabetes mellitus, sleep apnea who presents for post-hospital follow-up.    He was seen by Dr. Sallyanne Kuster in May 2017 as a consult at [name] for chest pain. He had reported an MI in 50 in [name] but had no stents placed. During that admission in 2017 he ruled out for MI. Nuclear stress test showed no ischemia. Echo showedd LVEF 40%, felt to represent NICM. He missed his follow-up appointments with Dr. Sallyanne Kuster. He came to follow-up in 08/2017 reporting intermittent chest pain so definitive cath was arranged. This was done 08/25/17 showing mild nonobstructive CAD with LVEF 35-45% with normal filling pressures. F/u echo 09/2017 showed EF 20-25%. His med titration has been limited by orthostatic-type diziness. He returned to the hospital 10/2017 with chest pain. UDS was negative. CTA showed no evidence of PE, abdominal aortic aneurysm or dissection, + stable left adrenal nodule. He also had some left sided weakness after hitting nose on a table and there was no evidence of reported neurologic abnormality. Brain imaging was nonacute. TSH 10/2017 wnl.  Last echo 12/2017 EF 35-40%, grade 2 DD. He was readmitted 03/2018 with recurrent chest pain and found to have PAF on telemetry. he was started on apixaban. It is not clear to me from the notes if this was correlated with his chest pain. Otherwise recent labs 03/2018 showed Hgb 14.6, Cr 1.07, K 3.8, BNP 134, Mg 1.9, LDL 154 (started on atorvastatin).  He returns for follow-up continuing to report daily episodes of chronic sharp stabbing chest pain without particular trigger or relieving factor. He  confirms this has been consistent for years. It lasts 10-15 minutes without any other associated factors. He told the intake nurse he stopped taking his lisinopril. When questioned about this, he isn't really sure why. He denies any specific adverse reaction but continues to report chronic lightheadedness which has an orthostatic component. He manages his own medications. He stopped taking ranitidine and omeprazole as they made no change in his chest pain. He takes Aricept for memory loss as prescribed by his PCP. He states this was started when he realized sometimes he would walk into a room and not recognize where he was. The etiology for his memory issues is not clear per his report, but father did have Alzheimer's. He does not currently use CPAP. He is looking for someone to follow his OSA.   Past Medical History:  Diagnosis Date  . Adenoidal hypertrophy   . Adenomatous colon polyps 2012  . Arthritis   . Diabetes mellitus   . GERD (gastroesophageal reflux disease)   . Heart attack ([name])    While living in [place]  [name] Hx of adenomatous colonic polyps 08/18/2017  . Hyperlipidemia   . Hypertension   . Mild CAD    a. cath in 08/2017 showing mild nonobstructive CAD with scattered 20-30% stenosis.   . Nonischemic cardiomyopathy ([name])    a. EF 20-25% by echo in 09/2017 with cath showing mild CAD. b.  Last echo 12/2017 EF 35-40%, grade 2 DD.  [name] Obesity   . PAF (paroxysmal atrial fibrillation) ([name])   . Sleep apnea    cpap  Past Surgical History:  Procedure Laterality Date  . COLONOSCOPY  05/13/11   9 adenomas  . FOREARM SURGERY    . MUSCLE BIOPSY    . RIGHT/LEFT HEART CATH AND CORONARY ANGIOGRAPHY N/A 08/25/2017   Procedure: RIGHT/LEFT HEART CATH AND CORONARY ANGIOGRAPHY;  Surgeon: [name], MD;  Location: [name];  Service: Cardiovascular;  Laterality: N/A;    Current Medications: Current Meds  Medication Sig  . apixaban (ELIQUIS) 5 MG TABS tablet Take 1 tablet (5  mg total) by mouth 2 (two) times daily.  [name] atorvastatin (LIPITOR) 40 MG tablet Take 1 tablet (40 mg total) by mouth daily at 6 PM.  . Blood Glucose Monitoring Suppl (TRUE METRIX AIR GLUCOSE METER) w/Device KIT 1 strip 3 (three) times daily by Does not apply route. Use 1 strip to check blood sugar three (3) times daily. Dx:E11.9  . carvedilol (COREG) 6.25 MG tablet Take 1 tablet (6.25 mg total) by mouth 2 (two) times daily with a meal.  . cetirizine (ZYRTEC) 10 MG tablet Take 1 tablet (10 mg total) by mouth daily.  . diphenhydrAMINE (BENADRYL) 25 MG tablet Take 25 mg by mouth as needed for itching.  . donepezil (ARICEPT) 10 MG tablet Take 1 tablet (10 mg total) by mouth at bedtime.  . insulin NPH-regular Human (NOVOLIN 70/30) (70-30) 100 UNIT/ML injection 30 units SQ in the am and 15 units SQ in the pm  . Insulin Syringe-Needle U-100 (INSULIN SYRINGE .3CC/29GX1/2") 29G X 1/2" 0.3 ML MISC Inject 1 Syringe 3 (three) times daily as directed. Check blood sugar three times daily. Dx: E11.9  . metFORMIN (GLUCOPHAGE) 500 MG tablet Take 2 tablets (1,000 mg total) by mouth 2 (two) times daily with a meal.  . nitroGLYCERIN (NITROSTAT) 0.4 MG SL tablet Place 1 tablet (0.4 mg total) under the tongue every 5 (five) minutes as needed for chest pain.      Allergies:   Patient has no known allergies.   Social History   Socioeconomic History  . Marital status: Single    Spouse name: Not on file  . Number of children: 0  . Years of education: Not on file  . Highest education level: Not on file  Occupational History  . Occupation: Disability  Social Needs  . Financial resource strain: Not on file  . Food insecurity:    Worry: Not on file    Inability: Not on file  . Transportation needs:    Medical: Not on file    Non-medical: Not on file  Tobacco Use  . Smoking status: Never Smoker  . Smokeless tobacco: Never Used  Substance and Sexual Activity  . Alcohol use: No  . Drug use: No  . Sexual  activity: Yes    Birth control/protection: None  Lifestyle  . Physical activity:    Days per week: Not on file    Minutes per session: Not on file  . Stress: Not on file  Relationships  . Social connections:    Talks on phone: Not on file    Gets together: Not on file    Attends religious service: Not on file    Active member of club or organization: Not on file    Attends meetings of clubs or organizations: Not on file    Relationship status: Not on file  Other Topics Concern  . Not on file  Social History Narrative   Social History      Diet?  Do you drink/eat things with caffeine? yes      Marital status?       single                             What year were you married?      Do you live in a house, apartment, assisted living, condo, trailer, etc.? yes      Is it one or more stories? One story      How many persons live in your home?      Do you have any pets in your home? (please list) none      Highest level of education completed? graduate      Current or past profession:      Do you exercise?            no                          Type & how often?      Advanced Directives      Do you have a living will? no      Do you have a DNR form?                                  If not, do you want to discuss one? no      Do you have signed POA/HPOA for forms? no      Functional Status      Do you have difficulty bathing or dressing yourself? no      Do you have difficulty preparing food or eating? no      Do you have difficulty managing your medications? no      Do you have difficulty managing your finances? no      Do you have difficulty affording your medications? no     Family History:  The patient's family history includes Heart attack (age of onset: 51) in his mother; Heart attack (age of onset: 8) in his sister; Heart attack (age of onset: 73) in his father; Heart disease in his father and mother; Hyperlipidemia in his father and mother;  Hypertension in his father and mother. There is no history of Colon cancer, Stomach cancer, Esophageal cancer, or Rectal cancer.  ROS:   Please see the history of present illness.   All other systems are reviewed and otherwise negative.    PHYSICAL EXAM:   VS:  BP 126/86   Pulse 89   Ht 5' 7" (1.702 m)   Wt 163 lb (73.9 kg)   SpO2 98%   BMI 25.53 kg/m   BMI: Body mass index is 25.53 kg/m. GEN: Well nourished, well developed AAM, in no acute distress HEENT: normocephalic, atraumatic. Sclera mildly icteric Neck: no JVD, carotid bruits, or masses Cardiac: RRR; no murmurs, rubs, or gallops, no edema  Respiratory:  clear to auscultation bilaterally, normal work of breathing GI: soft, nontender, nondistended, + BS MS: no deformity or atrophy Skin: warm and dry, no rash Neuro:  Alert and Oriented x 3, Strength and sensation are intact, follows commands Psych: euthymic mood, full affect  Wt Readings from Last 3 Encounters:  03/21/18 163 lb (73.9 kg)  03/11/18 151 lb 3.2 oz (68.6 kg)  11/15/17 163 lb (73.9 kg)      Studies/Labs Reviewed:   EKG:  EKG  was ordered today and personally reviewed by me and demonstrates NSR 89bpm, nonspecific TW changes, similar to prior.  Recent Labs: 10/15/2017: TSH 1.362 03/09/2018: ALT 18; B Natriuretic Peptide 132.4; Magnesium 1.9 03/10/2018: BUN 7; Creatinine, Ser 1.07; Hemoglobin 14.6; Platelets 212; Potassium 3.8; Sodium 137   Lipid Panel    Component Value Date/Time   CHOL 222 (H) 03/09/2018 1500   CHOL 199 11/10/2017 0953   TRIG 52 03/09/2018 1500   HDL 58 03/09/2018 1500   HDL 50 11/10/2017 0953   CHOLHDL 3.8 03/09/2018 1500   VLDL 10 03/09/2018 1500   LDLCALC 154 (H) 03/09/2018 1500   LDLCALC 135 (H) 11/10/2017 0953   LDLCALC 135 (H) 09/22/2017 0954    Additional studies/ records that were reviewed today include: Summarized above, and  Cardiac Cath 08/25/17 Procedures   RIGHT/LEFT HEART CATH AND CORONARY ANGIOGRAPHY Conclusion       Prox RCA to Mid RCA lesion is 30% stenosed.  Mid RCA to Dist RCA lesion is 30% stenosed.  Prox Cx to Dist Cx lesion is 20% stenosed.  Mid LAD lesion is 30% stenosed.  There is mild to moderate left ventricular systolic dysfunction.  LV end diastolic pressure is mildly elevated.  The left ventricular ejection fraction is 35-45% by visual estimate.  There is no mitral valve regurgitation.  1. Mild non-obstructive CAD 2. Moderate LV systolic dysfunction. Non-ischemic cardiomyopathy 3. Normal filling pressures.    ASSESSMENT & PLAN:   1. Atypical chest pain - etiology unclear, no evidence of obstructive CAD, ACS, or thoracic vascular abnormality by CT imaging. Have recommended he discuss further evaluation with his PCP as he may require GI evaluation to exclude gastrointestinal cause. He stopped antiacid therapy as it made no difference in his symptoms. 2. Paroxysmal atrial fibrillation - in NSR today. Will place 48-hour Holter to get an idea as to whether or not perhaps he is experiencing episodes that correlate to his chest pain. Continue BB, apixaban. At next f/u visit, would consider a repeat surveillance CBC/BMET. 3. NICM - outlined as above. Etiology not clear. He stopped lisinopril and isn't sure why. He continues to experience daily lightheadedness with orthostatic component. BP did drop from 126 to 110 with standing with exacerbation of dizziness. Will leave off lisinopril for now and await Holter (if still having paroxysms of afib, may benefit from titration of carvedilol first). I wonder if cardiac MRI would be useful to further delineate his cardiomyopathy since he has not tolerated traditional HF therapies. I will d/w Dr. Angelena Form. 4. Hyperlipidemia - started on Lipitor in the hospital. However, upon review of records earlier this year he had been started on it in the office as well but with questionable compliance. If tolerating and taking at f/u OV, will need repeat  liver/lipids. 5. CAD - mild, not on ASA due to concomitant apixaban use. 6. Memory loss - he is A+Ox3 but describes this as sometimes he will walk into a room and forget where he is. I will refer to neurology to further investigate given his fairly young age. 7. OSA - he reports this is not followed by anyone at this point, and no one is managing his CPAP so he hasn't been wearing reliably. Question if this relates to #6 - will repeat sleep study and refer to Dr. Radford Pax to help manage.  Disposition: F/u with Dr. Angelena Form care team APP in 3-4 weeks.   Medication Adjustments/Labs and Tests Ordered: Current medicines are reviewed at length with the patient today.  Concerns regarding medicines are outlined above. Medication changes, Labs and Tests ordered today are summarized above and listed in the Patient Instructions accessible in Encounters.   Signed, Charlie Pitter, [name]  03/21/2018 11:33 AM    [facility] [address], [place], [place]  [address] Phone: [phone]; Fax: [phone]

## 2018-03-21 ENCOUNTER — Ambulatory Visit: Payer: Medicare HMO | Admitting: Physician Assistant

## 2018-03-21 ENCOUNTER — Encounter: Payer: Self-pay | Admitting: Physician Assistant

## 2018-03-21 ENCOUNTER — Encounter: Payer: Self-pay | Admitting: Neurology

## 2018-03-21 ENCOUNTER — Telehealth: Payer: Self-pay | Admitting: Physician Assistant

## 2018-03-21 VITALS — BP 126/86 | HR 89 | Ht 67.0 in | Wt 163.0 lb

## 2018-03-21 DIAGNOSIS — I2510 Atherosclerotic heart disease of native coronary artery without angina pectoris: Secondary | ICD-10-CM

## 2018-03-21 DIAGNOSIS — G4733 Obstructive sleep apnea (adult) (pediatric): Secondary | ICD-10-CM | POA: Diagnosis not present

## 2018-03-21 DIAGNOSIS — I428 Other cardiomyopathies: Secondary | ICD-10-CM

## 2018-03-21 DIAGNOSIS — R0789 Other chest pain: Secondary | ICD-10-CM | POA: Diagnosis not present

## 2018-03-21 DIAGNOSIS — R413 Other amnesia: Secondary | ICD-10-CM

## 2018-03-21 DIAGNOSIS — E785 Hyperlipidemia, unspecified: Secondary | ICD-10-CM | POA: Diagnosis not present

## 2018-03-21 DIAGNOSIS — I480 Paroxysmal atrial fibrillation: Secondary | ICD-10-CM

## 2018-03-21 DIAGNOSIS — I10 Essential (primary) hypertension: Secondary | ICD-10-CM | POA: Diagnosis not present

## 2018-03-21 DIAGNOSIS — I5022 Chronic systolic (congestive) heart failure: Secondary | ICD-10-CM

## 2018-03-21 NOTE — Telephone Encounter (Signed)
Please call pt. I reviewed his chart with Dr. Angelena Form. Since his EF is low and he has not been able to tolerate traditional CHF meds, we are recommending he have a cardiac MRI (MRI of his heart) to exclude any sort of unusual infiltrative disease that would precipitate a decline in his heart function the way it has presented in the last year. Please arrange. I was also going through the chart more in depth after his visit to make sure he's had the full workup for this LV dysfunction and the only other thing out of date would be an HIV screen. If agreeable, would suggest this blood draw. Tya Haughey [name]

## 2018-03-21 NOTE — Patient Instructions (Addendum)
Medication Instructions:  Your physician recommends that you continue on your current medications as directed. Please refer to the Current Medication list given to you today.   Labwork: None ordered  Testing/Procedures: Your physician has recommended that you wear a holter monitor. Holter monitors are medical devices that record the heart's electrical activity. Doctors most often use these monitors to diagnose arrhythmias. Arrhythmias are problems with the speed or rhythm of the heartbeat. The monitor is a small, portable device. You can wear one while you do your normal daily activities. This is usually used to diagnose what is causing palpitations/syncope (passing out).  You have been referred to [facility].  THEY WILL CONTACT YOU FOR AN APPT.  Your physician has recommended that you have a sleep study. This test records several body functions during sleep, including: brain activity, eye movement, oxygen and carbon dioxide blood levels, heart rate and rhythm, breathing rate and rhythm, the flow of air through your mouth and nose, snoring, body muscle movements, and chest and belly movement.     Follow-Up: Your physician recommends that you schedule a follow-up appointment in: 3-4 WEEKS WITH DAYNA DUNN, [name] OR DR. [facility] TEAM APP   Any Other Special Instructions Will Be Listed Below (If Applicable). Sleep Studies A sleep study (polysomnogram) is a series of tests done while you are sleeping. It can show how well you sleep. This can help your health care provider diagnose a sleep disorder and show how severe your sleep disorder is. A sleep study may lead to treatment that will help you sleep better and prevent other medical problems caused by poor sleep. If you have a sleep disorder, you may also be at risk for:  Sleep-related accidents.  High blood pressure.  Heart disease.  Stroke.  Other medical conditions.  Sleep disorders are common. Your health care provider  may suspect a sleep disorder if you:  Have loud snoring most nights.  Have brief periods when you stop breathing at night.  Feel sleepy on most days.  Fall asleep suddenly during the day.  Have trouble falling asleep or staying asleep.  Feel like you need to move your legs when trying to fall asleep.  Have dreams that seem very real shortly after falling asleep.  Feel like you cannot move when you first wake up.  Which tests will I need to have? Most sleep studies last all night and include these tests:  Recordings of your brain activity.  Recordings of your eye movements.  Recording of your heart rate and rhythm.  Blood pressure readings.  Readings of the amount of oxygen in your blood.  Measurements of your chest and belly movement as you breathe during sleep.  If you have signs of the sleep disorder called sleep apnea during your test, you may get a mask to wear for the second half of the night.  The mask provides continuous positive airway pressure (CPAP). This may improve sleep apnea significantly.  You will then have all tests done again with the mask in place to see if your measurements and recordings change.  How are sleep studies done? Most sleep studies are done over one full night of sleep.  You will arrive at the study center in the evening and can go home in the morning.  Bring your pajamas and toothbrush.  Do not have caffeine on the day of your sleep study.  Your health care provider will let you know if you need to stop taking any of your  regular medicines before the test.  To do the tests included in a polysomnogram, you will have:  Round, sticky patches with sensors attached to recording wires (electrodes) placed on your scalp, face, chest, and limbs.  Wires from all the electrodes and sensors run from your bed to a computer. The wires can be taken off and put back on if you need to get out of bed to go to the bathroom.  A sensor placed over  your nose to measure airflow.  A finger clip put on one finger to measure your blood oxygen level.  A belt around your belly and a belt around your chest to measure breathing movements.  Where are sleep studies done? Sleep studies are done at sleep centers. A sleep center may be inside a hospital, office, or clinic. The room where you have the study may look like a hospital room or a hotel room. The health care providers doing the study may come in and out of the room during the study. Most of the time, they will be in another room monitoring your test. How is information from sleep studies helpful? A polysomnogram can be used along with your medical history and a physical exam to diagnose conditions, such as:  Sleep apnea.  Restless legs syndrome.  Sleep-related seizure disorders.  Sleep-related movement disorders.  A medical doctor who specializes in sleep will evaluate your sleep study. The specialist will share the results with your primary health care provider. Treatments based on your sleep study may include:  Improving your sleep habits (sleep hygiene).  Wearing a CPAP mask.  Wearing an oral device at night to improve breathing and reduce snoring.  Taking medicine for: ? Restless legs syndrome. ? Sleep-related seizure disorder. ? Sleep-related movement disorder.  This information is not intended to replace advice given to you by your health care provider. Make sure you discuss any questions you have with your health care provider. Document Released: 12/25/2002 Document Revised: 02/14/2016 Document Reviewed: 08/26/2013 Elsevier Interactive Patient Education  2018 [place].     Holter Monitoring A Holter monitor is a small device that is used to detect abnormal heart rhythms. It clips to your clothing and is connected by wires to flat, sticky disks (electrodes) that attach to your chest. It is worn continuously for 24-48 hours. Follow these instructions at  home:  Wear your Holter monitor at all times, even while exercising and sleeping, for as long as directed by your health care provider.  Make sure that the Holter monitor is safely clipped to your clothing or close to your body as recommended by your health care provider.  Do not get the monitor or wires wet.  Do not put body lotion or moisturizer on your chest.  Keep your skin clean.  Keep a diary of your daily activities, such as walking and doing chores. If you feel that your heartbeat is abnormal or that your heart is fluttering or skipping a beat: ? Record what you are doing when it happens. ? Record what time of day the symptoms occur.  Return your Holter monitor as directed by your health care provider.  Keep all follow-up visits as directed by your health care provider. This is important. Get help right away if:  You feel lightheaded or you faint.  You have trouble breathing.  You feel pain in your chest, upper arm, or jaw.  You feel sick to your stomach and your skin is pale, cool, or damp.  You heartbeat feels  unusual or abnormal. This information is not intended to replace advice given to you by your health care provider. Make sure you discuss any questions you have with your health care provider. Document Released: 03/18/2004 Document Revised: 11/26/2015 Document Reviewed: 01/27/2014 Elsevier Interactive Patient Education  [employer].  If you need a refill on your cardiac medications before your next appointment, please call your pharmacy.

## 2018-03-22 NOTE — Telephone Encounter (Signed)
Spoke with pt re: [facility]opa, [name]'s message below re: Cardiac MRI and HIV Lab work. Pt is in agreeance with the recommendations. Order has been placed for both and pt will get the lab drawn at [facility] when he goes for his MRI. Pt thanked me for the call.

## 2018-03-27 ENCOUNTER — Encounter: Payer: Self-pay | Admitting: Endocrinology

## 2018-03-27 ENCOUNTER — Ambulatory Visit: Payer: Medicare HMO | Admitting: Endocrinology

## 2018-03-27 VITALS — BP 100/62 | HR 95 | Ht 67.0 in | Wt 164.4 lb

## 2018-03-27 DIAGNOSIS — E1140 Type 2 diabetes mellitus with diabetic neuropathy, unspecified: Secondary | ICD-10-CM | POA: Diagnosis not present

## 2018-03-27 DIAGNOSIS — Z794 Long term (current) use of insulin: Secondary | ICD-10-CM | POA: Diagnosis not present

## 2018-03-27 MED ORDER — GLUCOSE BLOOD VI STRP: 1.0000 | ORAL_STRIP | Freq: Two times a day (BID) | 12 refills | Status: DC

## 2018-03-27 NOTE — Progress Notes (Signed)
Subjective:    Patient ID: Calvin [name], male    DOB: [birth year], 57 y.o.   MRN: [id number]  HPI pt is referred by Dr [name], for diabetes.  Pt states DM was dx'ed in 2009; he has severe neuropathy of the lower extremities, and assoc pain; he also has CAD and DR; he has been on insulin since soon after dx; pt says his diet and exercise are poor; he has never had pancreatitis, pancreatic surgery, severe hypoglycemia or DKA.  He takes only human insulin, due to cost.  Pt says he was missing insulin prior to recent hospitalization, but since then, he does not miss it.   Past Medical History:  Diagnosis Date  . Adenoidal hypertrophy   . Adenomatous colon polyps 2012  . Arthritis   . Diabetes mellitus   . GERD (gastroesophageal reflux disease)   . Heart attack ([place])    [place] in [place]  [name] Hx of adenomatous colonic polyps 08/18/2017  . Hyperlipidemia   . Hypertension   . Mild CAD    a. cath in 08/2017 showing mild nonobstructive CAD with scattered 20-30% stenosis.   . Nonischemic cardiomyopathy ([place])    a. EF 20-25% by echo in 09/2017 with cath showing mild CAD. b.  Last echo 12/2017 EF 35-40%, grade 2 DD.  [name] Obesity   . PAF (paroxysmal atrial fibrillation) ([place])   . Sleep apnea    cpap    Past Surgical History:  Procedure Laterality Date  . COLONOSCOPY  05/13/11   9 adenomas  . FOREARM SURGERY    . MUSCLE BIOPSY    . RIGHT/LEFT HEART CATH AND CORONARY ANGIOGRAPHY N/A 08/25/2017   Procedure: RIGHT/LEFT HEART CATH AND CORONARY ANGIOGRAPHY;  Surgeon: [name], MD;  Location: [facility];  Service: Cardiovascular;  Laterality: N/A;    Social History   Socioeconomic History  . Marital status: Single    Spouse name: Not on file  . Number of children: 0  . Years of education: Not on file  . Highest education level: Not on file  Occupational History  . Occupation: Disability  Social Needs  . Financial resource strain: Not on file  . Food insecurity:      Worry: Not on file    Inability: Not on file  . Transportation needs:    Medical: Not on file    Non-medical: Not on file  Tobacco Use  . Smoking status: Never Smoker  . Smokeless tobacco: Never Used  Substance and Sexual Activity  . Alcohol use: No  . Drug use: No  . Sexual activity: Yes    Birth control/protection: None  Lifestyle  . Physical activity:    Days per week: Not on file    Minutes per session: Not on file  . Stress: Not on file  Relationships  . Social connections:    Talks on phone: Not on file    Gets together: Not on file    Attends religious service: Not on file    Active member of club or organization: Not on file    Attends meetings of clubs or organizations: Not on file    Relationship status: Not on file  . Intimate partner violence:    Fear of current or ex partner: Not on file    Emotionally abused: Not on file    Physically abused: Not on file    Forced sexual activity: Not on file  Other Topics Concern  . Not on file  Social History Narrative   Social History      Diet?       Do you drink/eat things with caffeine? yes      Marital status?       single                             What year were you married?      Do you live in a house, apartment, assisted living, condo, trailer, etc.? yes      Is it one or more stories? One story      How many persons live in your home?      Do you have any pets in your home? (please list) none      Highest level of education completed? graduate      Current or past profession:      Do you exercise?            no                          Type & how often?      Advanced Directives      Do you have a living will? no      Do you have a DNR form?                                  If not, do you want to discuss one? no      Do you have signed POA/HPOA for forms? no      Functional Status      Do you have difficulty bathing or dressing yourself? no      Do you have difficulty preparing food or  eating? no      Do you have difficulty managing your medications? no      Do you have difficulty managing your finances? no      Do you have difficulty affording your medications? no    Current Outpatient Medications on File Prior to Visit  Medication Sig Dispense Refill  . apixaban (ELIQUIS) 5 MG TABS tablet Take 1 tablet (5 mg total) by mouth 2 (two) times daily. 60 tablet 0  . atorvastatin (LIPITOR) 40 MG tablet Take 1 tablet (40 mg total) by mouth daily at 6 PM. 30 tablet 0  . Blood Glucose Monitoring Suppl (TRUE METRIX AIR GLUCOSE METER) w/Device KIT 1 strip 3 (three) times daily by Does not apply route. Use 1 strip to check blood sugar three (3) times daily. Dx:E11.9 1 kit 0  . carvedilol (COREG) 6.25 MG tablet Take 1 tablet (6.25 mg total) by mouth 2 (two) times daily with a meal. 60 tablet 0  . donepezil (ARICEPT) 10 MG tablet Take 1 tablet (10 mg total) by mouth at bedtime. 30 tablet 11  . insulin NPH-regular Human (NOVOLIN 70/30) (70-30) 100 UNIT/ML injection 30 units SQ in the am and 15 units SQ in the pm 10 mL 3  . metFORMIN (GLUCOPHAGE) 500 MG tablet Take 2 tablets (1,000 mg total) by mouth 2 (two) times daily with a meal. 120 tablet 6  . nitroGLYCERIN (NITROSTAT) 0.4 MG SL tablet Place 1 tablet (0.4 mg total) under the tongue every 5 (five) minutes as needed for chest pain. 30 tablet 1  . Insulin Syringe-Needle U-100 (INSULIN SYRINGE .3CC/29GX1/2") 29G X 1/2" 0.3 ML  MISC Inject 1 Syringe 3 (three) times daily as directed. Check blood sugar three times daily. Dx: E11.9 (Patient not taking: Reported on 03/27/2018) 100 each 3   No current facility-administered medications on file prior to visit.     No Known Allergies  Family History  Problem Relation Age of Onset  . Heart disease Mother   . Heart attack Mother 62  . Hypertension Mother   . Hyperlipidemia Mother   . Diabetes Mother   . Heart disease Father   . Heart attack Father 10  . Hypertension Father   .  Hyperlipidemia Father   . Diabetes Father   . Heart attack Sister 50  . Colon cancer Neg Hx   . Stomach cancer Neg Hx   . Esophageal cancer Neg Hx   . Rectal cancer Neg Hx     BP 100/62 (BP Location: Left Arm)   Pulse 95   Ht _0  (1.702 m)   Wt 164 lb 6.4 oz (74.6 kg)   SpO2 95%   BMI 25.75 kg/m     Review of Systems denies blurry vision, headache, chest pain, urinary frequency, cold intolerance, rhinorrhea, and easy bruising.  He has lost 40 lbs x a few years.  He has chronic sxs of headache, leg cramps, memory loss, diaphoresis, depression, and doe     Objective:   Physical Exam VS: see vs page GEN: no distress HEAD: head: no deformity eyes: no periorbital swelling, no proptosis external nose and ears are normal mouth: no lesion seen NECK: supple, thyroid is not enlarged CHEST WALL: no deformity LUNGS: clear to auscultation CV: reg rate and rhythm, no murmur ABD: abdomen is soft, nontender.  no hepatosplenomegaly.  not distended.  no hernia.  MUSCULOSKELETAL: muscle bulk and strength are grossly normal.  no obvious joint swelling.  gait is normal and steady.  EXTEMITIES: no deformity.  no ulcer on the feet.  feet are of normal color and temp.  no edema.  There is bilateral onychomycosis of the toenails.   PULSES: dorsalis pedis intact bilat.  no carotid bruit NEURO:  cn 2-12 grossly intact.   readily moves all 4's.  sensation is intact to touch on the feet, but decreased from normal.   SKIN:  Normal texture and temperature.  No rash or suspicious lesion is visible.   NODES:  None palpable at the neck PSYCH: alert, well-oriented.  Does not appear anxious nor depressed.  I have reviewed outside records, and summarized: Pt was noted to have elevated a1c, and referred here.  He was recently admitted wil multiple cardiovascular problems.    Lab Results  Component Value Date   HGBA1C 11.9 (H) 03/09/2018   Lab Results  Component Value Date   CREATININE 1.07  03/10/2018   BUN 7 03/10/2018   NA 137 03/10/2018   K 3.8 03/10/2018   CL 104 03/10/2018   CO2 26 03/10/2018   Lab Results  Component Value Date   TSH 1.362 10/15/2017    I personally reviewed electrocardiogram tracing (03/21/18): Indication: chest pain Impression: NSR.  No MI.  NS-TWA Compared to 03/09/18: no significant change      Assessment & Plan:  Insulin-requiring type 2 DM, with CAD: severe exacerbation Noncompliance with cbg recording and insulin, new to me.  We discussed.  Pt says she is doing better now  Patient Instructions  good diet and exercise significantly improve the control of your diabetes.  please let me know if you wish to be referred  to a dietician.  high blood sugar is very risky to your health.  you should see an eye doctor and dentist every year.  It is very important to get all recommended vaccinations.  Controlling your blood pressure and cholesterol drastically reduces the damage diabetes does to your body.  Those who smoke should quit.  Please discuss these with your doctor.  Here is a new meter.  I have sent a prescription to your pharmacy, for strips. check your blood sugar twice a day.  vary the time of day when you check, between before the 3 meals, and at bedtime.  also check if you have symptoms of your blood sugar being too high or too low.  please keep a record of the readings and bring it to your next appointment here (or you can bring the meter itself).  You can write it on any piece of paper.  please call us sooner if your blood sugar goes below 70, or if you have a lot of readings over 200. A different type of blood test is requested for you today.  We'll let you know about the results.   Please come back for a follow-up appointment in 2 months.

## 2018-03-27 NOTE — Patient Instructions (Addendum)
good diet and exercise significantly improve the control of your diabetes.  please let me know if you wish to be referred to a dietician.  high blood sugar is very risky to your health.  you should see an eye doctor and dentist every year.  It is very important to get all recommended vaccinations.  Controlling your blood pressure and cholesterol drastically reduces the damage diabetes does to your body.  Those who smoke should quit.  Please discuss these with your doctor.  Here is a new meter.  I have sent a prescription to your pharmacy, for strips. check your blood sugar twice a day.  vary the time of day when you check, between before the 3 meals, and at bedtime.  also check if you have symptoms of your blood sugar being too high or too low.  please keep a record of the readings and bring it to your next appointment here (or you can bring the meter itself).  You can write it on any piece of paper.  please call us sooner if your blood sugar goes below 70, or if you have a lot of readings over 200. A different type of blood test is requested for you today.  We'll let you know about the results.   Please come back for a follow-up appointment in 2 months.

## 2018-03-28 ENCOUNTER — Ambulatory Visit (INDEPENDENT_AMBULATORY_CARE_PROVIDER_SITE_OTHER): Payer: Medicare HMO

## 2018-03-28 ENCOUNTER — Telehealth: Payer: Self-pay

## 2018-03-28 ENCOUNTER — Other Ambulatory Visit: Payer: Self-pay | Admitting: Physician Assistant

## 2018-03-28 DIAGNOSIS — I428 Other cardiomyopathies: Secondary | ICD-10-CM

## 2018-03-28 DIAGNOSIS — I480 Paroxysmal atrial fibrillation: Secondary | ICD-10-CM | POA: Diagnosis not present

## 2018-03-28 DIAGNOSIS — G4733 Obstructive sleep apnea (adult) (pediatric): Secondary | ICD-10-CM

## 2018-03-28 DIAGNOSIS — I2510 Atherosclerotic heart disease of native coronary artery without angina pectoris: Secondary | ICD-10-CM

## 2018-03-28 DIAGNOSIS — I10 Essential (primary) hypertension: Secondary | ICD-10-CM

## 2018-03-28 DIAGNOSIS — R42 Dizziness and giddiness: Secondary | ICD-10-CM

## 2018-03-28 DIAGNOSIS — R0789 Other chest pain: Secondary | ICD-10-CM

## 2018-03-28 DIAGNOSIS — E785 Hyperlipidemia, unspecified: Secondary | ICD-10-CM

## 2018-03-28 DIAGNOSIS — R413 Other amnesia: Secondary | ICD-10-CM | POA: Diagnosis not present

## 2018-03-28 NOTE — Telephone Encounter (Signed)
Pt came in today to have a holter monitor placed. During his visit he had some c/o of right sided pain from his shoulder to his right foot. Pt denies SOB, back pain, mid or left sided chest pain. Pain does not radiate or become worse with activity. I advised pt this may be more muscular skeletal pain vs CP. I suggested he contact his PCP regarding this. He may also take tylenol to see if this relieves his pain. However, if it becomes worse, he should report to the ED.

## 2018-04-01 LAB — FRUCTOSAMINE: Fructosamine: 367 umol/L — ABNORMAL HIGH (ref 190–270)

## 2018-04-09 ENCOUNTER — Telehealth: Payer: Self-pay

## 2018-04-09 ENCOUNTER — Ambulatory Visit (INDEPENDENT_AMBULATORY_CARE_PROVIDER_SITE_OTHER): Payer: Medicare HMO | Admitting: Nurse Practitioner

## 2018-04-09 ENCOUNTER — Encounter: Payer: Self-pay | Admitting: Nurse Practitioner

## 2018-04-09 VITALS — BP 108/68 | HR 94 | Temp 98.7°F | Ht 67.0 in | Wt 163.4 lb

## 2018-04-09 DIAGNOSIS — G3184 Mild cognitive impairment, so stated: Secondary | ICD-10-CM | POA: Diagnosis not present

## 2018-04-09 DIAGNOSIS — I5022 Chronic systolic (congestive) heart failure: Secondary | ICD-10-CM | POA: Diagnosis not present

## 2018-04-09 DIAGNOSIS — I480 Paroxysmal atrial fibrillation: Secondary | ICD-10-CM

## 2018-04-09 DIAGNOSIS — Z794 Long term (current) use of insulin: Secondary | ICD-10-CM | POA: Diagnosis not present

## 2018-04-09 DIAGNOSIS — K5909 Other constipation: Secondary | ICD-10-CM | POA: Diagnosis not present

## 2018-04-09 DIAGNOSIS — E1140 Type 2 diabetes mellitus with diabetic neuropathy, unspecified: Secondary | ICD-10-CM

## 2018-04-09 NOTE — Progress Notes (Signed)
Careteam: Patient Care Team: [name], DO as PCP - General (Internal Medicine) [name], MD as PCP - Cardiology (Cardiology)  Advanced Directive information Does Patient Have a Medical Advance Directive?: No  No Known Allergies  Chief Complaint  Patient presents with  . Medical Management of Chronic Issues    Pt is being seen for a routine visit. Pt reports that he is getting very dizzy today.      HPI: Patient is a 57 y.o. male seen in the office today for routine follow up.  Previous pt of Dr [name] pt with hx of CHF/cardiomyopathy following with cardiology, GERD, DM, hyperlipidemia, OSA  DM- following with endocrinologist- currently taking 70/30. 30 units in the morning and 15 units in the evening. States his readings are high. Reports blood sugars in the 400s and he is taking his insulin.  Unsure what to do about food. Used to his blood sugars being around 400. Appears he has nutrition consult next month  Paroxysmal a fib- recently hospitalization due to chest pain. Hx of a fib but in SR on discharge. Reports he is taking eliquis 5 mg BID. EF 30-40%  CHF/cardiomyopathy- recently hospitalized due to chest pain pt with hx of chest pain. Per records pt reporting intermittent chest pain in feb 2019 so definitive cath was arranged. This was done 08/25/17 showing mild nonobstructive CAD with LVEF 35-45% with normal filling pressures. F/u echo 09/2017 showed EF 20-25%. His med titration has been limited by orthostatic-type dizziness. He returned to the hospital 10/2017 with chest pain. UDS was negative. CTA showed no evidence of PE, abdominal aortic aneurysm or dissection, + stable left adrenal nodule. He also had some left sided weakness after hitting nose on a table and there was no evidence of reported neurologic abnormality. Brain imaging was nonacute. TSH 10/2017 wnl.  Last echo 12/2017 EF 35-40%, grade 2 DD. He was readmitted 03/2018 with recurrent chest pain and  found to have PAF on telemetry. he was started on apixaban. Pt continues to complain of dizziness and lightheaded currently taking coreg 3.125 mg BID, reports he feels the same when he takes medication and when he does not. There has been a lot of adjustments in medications due to tolerability and he is actually not sure what he is taking  Pt reports he has trouble remembering.  States he has been having a "short memory" for as long as he has been coming here. ~1 year Has scheduled appt with neurologist for evaluation. MRI head done 10/14/17. 1. No acute intracranial process. 2. Minimal chronic small vessel ischemic changes  Not having stools. Bowels really slow.  Review of Systems:  Review of Systems  Constitutional: Negative for chills, fever and weight loss.  HENT: Negative for tinnitus.   Respiratory: Negative for cough, sputum production and shortness of breath.   Cardiovascular: Negative for chest pain, palpitations and leg swelling.  Gastrointestinal: Positive for constipation. Negative for abdominal pain, diarrhea and heartburn.  Genitourinary: Negative for dysuria, frequency and urgency.  Musculoskeletal: Negative for back pain, falls, joint pain and myalgias.  Skin: Negative.   Neurological: Negative for dizziness and headaches.  Psychiatric/Behavioral: Positive for memory loss. Negative for depression. The patient does not have insomnia.      Past Medical History:  Diagnosis Date  . Adenoidal hypertrophy   . Adenomatous colon polyps 2012  . Arthritis   . Diabetes mellitus   . GERD (gastroesophageal reflux disease)   . Heart attack ([place])  [place]  [name] Hx of adenomatous colonic polyps 08/18/2017  . Hyperlipidemia   . Hypertension   . Mild CAD    a. cath in 08/2017 showing mild nonobstructive CAD with scattered 20-30% stenosis.   . Nonischemic cardiomyopathy ([place])    a. EF 20-25% by echo in 09/2017 with cath showing mild CAD. b.  Last echo 12/2017 EF 35-40%, grade  2 DD.  [name] Obesity   . PAF (paroxysmal atrial fibrillation) ([place])   . Sleep apnea    cpap   Past Surgical History:  Procedure Laterality Date  . COLONOSCOPY  05/13/11   9 adenomas  . FOREARM SURGERY    . MUSCLE BIOPSY    . RIGHT/LEFT HEART CATH AND CORONARY ANGIOGRAPHY N/A 08/25/2017   Procedure: RIGHT/LEFT HEART CATH AND CORONARY ANGIOGRAPHY;  Surgeon: [name], MD;  Location: [facility];  Service: Cardiovascular;  Laterality: N/A;   Social History:   reports that he has never smoked. He has never used smokeless tobacco. He reports that he does not drink alcohol or use drugs.  Family History  Problem Relation Age of Onset  . Heart disease Mother   . Heart attack Mother 48  . Hypertension Mother   . Hyperlipidemia Mother   . Diabetes Mother   . Heart disease Father   . Heart attack Father 81  . Hypertension Father   . Hyperlipidemia Father   . Diabetes Father   . Heart attack Sister 67  . Colon cancer Neg Hx   . Stomach cancer Neg Hx   . Esophageal cancer Neg Hx   . Rectal cancer Neg Hx     Medications: Patient's Medications  New Prescriptions   No medications on file  Previous Medications   APIXABAN (ELIQUIS) 5 MG TABS TABLET    Take 1 tablet (5 mg total) by mouth 2 (two) times daily.   ATORVASTATIN (LIPITOR) 40 MG TABLET    Take 1 tablet (40 mg total) by mouth daily at 6 PM.   BLOOD GLUCOSE MONITORING SUPPL (TRUE METRIX AIR GLUCOSE METER) W/DEVICE KIT    1 strip 3 (three) times daily by Does not apply route. Use 1 strip to check blood sugar three (3) times daily. Dx:E11.9   CARVEDILOL (COREG) 3.125 MG TABLET    Take 3.125 mg by mouth 2 (two) times daily with a meal.   CETIRIZINE (ZYRTEC) 10 MG TABLET    Take 10 mg by mouth daily.   DONEPEZIL (ARICEPT) 10 MG TABLET    Take 1 tablet (10 mg total) by mouth at bedtime.   GLUCOSE BLOOD (ONETOUCH VERIO) TEST STRIP    1 each by Other route 2 (two) times daily. And lancets 2/day.   INSULIN NPH-REGULAR  HUMAN (NOVOLIN 70/30) (70-30) 100 UNIT/ML INJECTION    30 units SQ in the am and 15 units SQ in the pm   INSULIN SYRINGE-NEEDLE U-100 (INSULIN SYRINGE .3CC/29GX1/2") 29G X 1/2" 0.3 ML MISC    Inject 1 Syringe 3 (three) times daily as directed. Check blood sugar three times daily. Dx: E11.9   METFORMIN (GLUCOPHAGE) 500 MG TABLET    Take 2 tablets (1,000 mg total) by mouth 2 (two) times daily with a meal.   NITROGLYCERIN (NITROSTAT) 0.4 MG SL TABLET    Place 1 tablet (0.4 mg total) under the tongue every 5 (five) minutes as needed for chest pain.  Modified Medications   No medications on file  Discontinued Medications   CARVEDILOL (COREG) 6.25  MG TABLET    Take 1 tablet (6.25 mg total) by mouth 2 (two) times daily with a meal.     Physical Exam:  Vitals:   04/09/18 1422  BP: 108/68  Pulse: 94  Temp: 98.7 F (37.1 C)  TempSrc: Oral  SpO2: 99%  Weight: 163 lb 6.4 oz (74.1 kg)  Height: 5' 7"  (1.702 m)   Body mass index is 25.59 kg/m.  Physical Exam  Constitutional: He appears well-developed and well-nourished.  HENT:  Mouth/Throat: Oropharynx is clear and moist.  MMM; no oral thrush  Eyes: Pupils are equal, round, and reactive to light. No scleral icterus.  Neck: Neck supple. Carotid bruit is not present. No thyromegaly present.  Cardiovascular: Normal rate, regular rhythm and intact distal pulses. Exam reveals no gallop and no friction rub.  Murmur (1/6 sem) heard. No distal LE edema. No calf TTP  Pulmonary/Chest: Effort normal and breath sounds normal. He has no wheezes. He has no rales. He exhibits no tenderness.  Abdominal: Soft. Normal appearance and bowel sounds are normal. He exhibits no distension, no abdominal bruit, no pulsatile midline mass and no mass. There is no hepatomegaly. There is no tenderness. There is no rigidity, no rebound and no guarding. No hernia.  Musculoskeletal: He exhibits edema.  Lymphadenopathy:    He has no cervical adenopathy.  Neurological: He  is alert.  Skin: Skin is warm and dry. No rash noted.  Psychiatric: He has a normal mood and affect. His behavior is normal.    Labs reviewed: Basic Metabolic Panel: Recent Labs    05/10/17 1238  10/15/17 0754 01/25/18 2245 03/09/18 0949 03/09/18 1849 03/10/18 0345  NA 135   < > 136 139 137  --  137  K 4.3   < > 4.1 3.9 4.2  --  3.8  CL 98   < > 105 107 99  --  104  CO2 28   < > 22 24 26   --  26  GLUCOSE 313*   < > 257* 248* 274*  --  241*  BUN 11   < > 6 11 9   --  7  CREATININE 1.27   < > 1.03 1.33* 1.23 1.14 1.07  CALCIUM 9.5   < > 8.5* 8.8* 9.5  --  8.6*  MG  --   --  1.9  --   --  1.9  --   PHOS  --   --  2.9  --   --  3.5  --   TSH 1.30  --  1.362  --   --   --   --    < > = values in this interval not displayed.   Liver Function Tests: Recent Labs    10/15/17 0754 11/10/17 0953 03/09/18 0949  AST 15 14 18   ALT 14* 14 18  ALKPHOS 60 76 72  BILITOT 0.5 0.4 1.0  PROT 5.8* 6.2 6.4*  ALBUMIN 3.3* 3.8 3.7   Recent Labs    03/09/18 0949  LIPASE 33   No results for input(s): AMMONIA in the last 8760 hours. CBC: Recent Labs    05/10/17 1238  01/25/18 2245 03/09/18 0949 03/09/18 1849 03/10/18 0345  WBC 5.7   < > 5.7 7.6 7.8 7.5  NEUTROABS 3,164  --  3.5  --   --   --   HGB 16.5   < > 15.4 15.7 15.7 14.6  HCT 48.2   < > 46.0 46.0 47.3 42.9  MCV  89.1   < > 90.9 89.3 90.6 89.6  PLT 232   < > 191 208 229 212   < > = values in this interval not displayed.   Lipid Panel: Recent Labs    10/15/17 0754 11/10/17 0953 03/09/18 1500  CHOL 219* 199 222*  HDL 49 50 58  LDLCALC 154* 135* 154*  TRIG 79 70 52  CHOLHDL 4.5 4.0 3.8   TSH: Recent Labs    05/10/17 1238 10/15/17 0754  TSH 1.30 1.362   A1C: Lab Results  Component Value Date   HGBA1C 11.9 (H) 03/09/2018     Assessment/Plan 1. Type 2 diabetes mellitus with diabetic neuropathy, with long-term current use of insulin ([place]) -does not appear that endocrinology has added additional medication.  Pt does not have blood sugar logs but reports blood sugars remain high. Recent a1c of 11.9, call placed to endocrine office and plan is for follow up with Dr Loanne Drilling and to bring blood sugar log to visit.  - Ambulatory referral to Podiatry  2. MCI (mild cognitive impairment) Poor memory and recall. Most of hx obtained from chart. Pt unsure of medications and does not have them at today's visit. He has follow up with neurology scheduled  3. Chronic systolic heart failure (HCC) Stable, continue current medications at this time  4. Paroxysmal atrial fibrillation (HCC) Rate controlled. Continues on coreg and eliquis   5. Other constipation -to use miralax daily to help control  Next appt: 05/21/2018 Janett Billow K. [place], [facility] [phone]

## 2018-04-09 NOTE — Patient Instructions (Addendum)
Endocrinologist (Dr Cordelia Pen office) will call you today with instruction   To use miralax 17 gm - this is over the counter-- mix with water  Increase fluid intake.   Diabetes Mellitus and Nutrition When you have diabetes (diabetes mellitus), it is very important to have healthy eating habits because your blood sugar (glucose) levels are greatly affected by what you eat and drink. Eating healthy foods in the appropriate amounts, at about the same times every day, can help you:  Control your blood glucose.  Lower your risk of heart disease.  Improve your blood pressure.  Reach or maintain a healthy weight.  Every person with diabetes is different, and each person has different needs for a meal plan. Your health care provider may recommend that you work with a diet and nutrition specialist (dietitian) to make a meal plan that is best for you. Your meal plan may vary depending on factors such as:  The calories you need.  The medicines you take.  Your weight.  Your blood glucose, blood pressure, and cholesterol levels.  Your activity level.  Other health conditions you have, such as heart or kidney disease.  How do carbohydrates affect me? Carbohydrates affect your blood glucose level more than any other type of food. Eating carbohydrates naturally increases the amount of glucose in your blood. Carbohydrate counting is a method for keeping track of how many carbohydrates you eat. Counting carbohydrates is important to keep your blood glucose at a healthy level, especially if you use insulin or take certain oral diabetes medicines. It is important to know how many carbohydrates you can safely have in each meal. This is different for every person. Your dietitian can help you calculate how many carbohydrates you should have at each meal and for snack. Foods that contain carbohydrates include:  Bread, cereal, rice, pasta, and crackers.  Potatoes and corn.  Peas, beans, and  lentils.  Milk and yogurt.  Fruit and juice.  Desserts, such as cakes, cookies, ice cream, and candy.  How does alcohol affect me? Alcohol can cause a sudden decrease in blood glucose (hypoglycemia), especially if you use insulin or take certain oral diabetes medicines. Hypoglycemia can be a life-threatening condition. Symptoms of hypoglycemia (sleepiness, dizziness, and confusion) are similar to symptoms of having too much alcohol. If your health care provider says that alcohol is safe for you, follow these guidelines:  Limit alcohol intake to no more than 1 drink per day for nonpregnant women and 2 drinks per day for men. One drink equals 12 oz of beer, 5 oz of wine, or 1 oz of hard liquor.  Do not drink on an empty stomach.  Keep yourself hydrated with water, diet soda, or unsweetened iced tea.  Keep in mind that regular soda, juice, and other mixers may contain a lot of sugar and must be counted as carbohydrates.  What are tips for following this plan? Reading food labels  Start by checking the serving size on the label. The amount of calories, carbohydrates, fats, and other nutrients listed on the label are based on one serving of the food. Many foods contain more than one serving per package.  Check the total grams (g) of carbohydrates in one serving. You can calculate the number of servings of carbohydrates in one serving by dividing the total carbohydrates by 15. For example, if a food has 30 g of total carbohydrates, it would be equal to 2 servings of carbohydrates.  Check the number of grams (g) of  saturated and trans fats in one serving. Choose foods that have low or no amount of these fats.  Check the number of milligrams (mg) of sodium in one serving. Most people should limit total sodium intake to less than 2,300 mg per day.  Always check the nutrition information of foods labeled as "low-fat" or "nonfat". These foods may be higher in added sugar or refined carbohydrates  and should be avoided.  Talk to your dietitian to identify your daily goals for nutrients listed on the label. Shopping  Avoid buying canned, premade, or processed foods. These foods tend to be high in fat, sodium, and added sugar.  Shop around the outside edge of the grocery store. This includes fresh fruits and vegetables, bulk grains, fresh meats, and fresh dairy. Cooking  Use low-heat cooking methods, such as baking, instead of high-heat cooking methods like deep frying.  Cook using healthy oils, such as olive, canola, or sunflower oil.  Avoid cooking with butter, cream, or high-fat meats. Meal planning  Eat meals and snacks regularly, preferably at the same times every day. Avoid going long periods of time without eating.  Eat foods high in fiber, such as fresh fruits, vegetables, beans, and whole grains. Talk to your dietitian about how many servings of carbohydrates you can eat at each meal.  Eat 4-6 ounces of lean protein each day, such as lean meat, chicken, fish, eggs, or tofu. 1 ounce is equal to 1 ounce of meat, chicken, or fish, 1 egg, or 1/4 cup of tofu.  Eat some foods each day that contain healthy fats, such as avocado, nuts, seeds, and fish. Lifestyle   Check your blood glucose regularly.  Exercise at least 30 minutes 5 or more days each week, or as told by your health care provider.  Take medicines as told by your health care provider.  Do not use any products that contain nicotine or tobacco, such as cigarettes and e-cigarettes. If you need help quitting, ask your health care provider.  Work with a Social worker or diabetes educator to identify strategies to manage stress and any emotional and social challenges. What are some questions to ask my health care provider?  Do I need to meet with a diabetes educator?  Do I need to meet with a dietitian?  What number can I call if I have questions?  When are the best times to check my blood glucose? Where to find  more information:  American Diabetes Association: diabetes.org/food-and-fitness/food  Academy of Nutrition and Dietetics: PokerClues.dk  [employer] of Diabetes and Digestive and Kidney Diseases (NIH): ContactWire.be Summary  A healthy meal plan will help you control your blood glucose and maintain a healthy lifestyle.  Working with a diet and nutrition specialist (dietitian) can help you make a meal plan that is best for you.  Keep in mind that carbohydrates and alcohol have immediate effects on your blood glucose levels. It is important to count carbohydrates and to use alcohol carefully. This information is not intended to replace advice given to you by your health care provider. Make sure you discuss any questions you have with your health care provider. Document Released: 03/17/2005 Document Revised: 07/25/2016 Document Reviewed: 07/25/2016 Elsevier Interactive Patient Education  2018 [place] Eating Plan DASH stands for "Dietary Approaches to Stop Hypertension." The DASH eating plan is a healthy eating plan that has been shown to reduce high blood pressure (hypertension). It may also reduce your risk for type 2 diabetes, heart disease, and stroke. The  DASH eating plan may also help with weight loss. What are tips for following this plan? General guidelines  Avoid eating more than 2,300 mg (milligrams) of salt (sodium) a day. If you have hypertension, you may need to reduce your sodium intake to 1,500 mg a day.  Limit alcohol intake to no more than 1 drink a day for nonpregnant women and 2 drinks a day for men. One drink equals 12 oz of beer, 5 oz of wine, or 1 oz of hard liquor.  Work with your health care provider to maintain a healthy body weight or to lose weight. Ask what an ideal weight is for you.  Get at least 30 minutes of exercise  that causes your heart to beat faster (aerobic exercise) most days of the week. Activities may include walking, swimming, or biking.  Work with your health care provider or diet and nutrition specialist (dietitian) to adjust your eating plan to your individual calorie needs. Reading food labels  Check food labels for the amount of sodium per serving. Choose foods with less than 5 percent of the Daily Value of sodium. Generally, foods with less than 300 mg of sodium per serving fit into this eating plan.  To find whole grains, look for the word "whole" as the first word in the ingredient list. Shopping  Buy products labeled as "low-sodium" or "no salt added."  Buy fresh foods. Avoid canned foods and premade or frozen meals. Cooking  Avoid adding salt when cooking. Use salt-free seasonings or herbs instead of table salt or sea salt. Check with your health care provider or pharmacist before using salt substitutes.  Do not fry foods. Cook foods using healthy methods such as baking, boiling, grilling, and broiling instead.  Cook with heart-healthy oils, such as olive, canola, soybean, or sunflower oil. Meal planning   Eat a balanced diet that includes: ? 5 or more servings of fruits and vegetables each day. At each meal, try to fill half of your plate with fruits and vegetables. ? Up to 6-8 servings of whole grains each day. ? Less than 6 oz of lean meat, poultry, or fish each day. A 3-oz serving of meat is about the same size as a deck of cards. One egg equals 1 oz. ? 2 servings of low-fat dairy each day. ? A serving of nuts, seeds, or beans 5 times each week. ? Heart-healthy fats. Healthy fats called Omega-3 fatty acids are found in foods such as flaxseeds and coldwater fish, like sardines, salmon, and mackerel.  Limit how much you eat of the following: ? Canned or prepackaged foods. ? Food that is high in trans fat, such as fried foods. ? Food that is high in saturated fat, such as  fatty meat. ? Sweets, desserts, sugary drinks, and other foods with added sugar. ? Full-fat dairy products.  Do not salt foods before eating.  Try to eat at least 2 vegetarian meals each week.  Eat more home-cooked food and less restaurant, buffet, and fast food.  When eating at a restaurant, ask that your food be prepared with less salt or no salt, if possible. What foods are recommended? The items listed may not be a complete list. Talk with your dietitian about what dietary choices are best for you. Grains Whole-grain or whole-wheat bread. Whole-grain or whole-wheat pasta. Brown rice. [place]. Bulgur. Whole-grain and low-sodium cereals. Pita bread. Low-fat, low-sodium crackers. Whole-wheat flour tortillas. Vegetables Fresh or frozen vegetables (raw, steamed, roasted, or grilled). Low-sodium or  reduced-sodium tomato and vegetable juice. Low-sodium or reduced-sodium tomato sauce and tomato paste. Low-sodium or reduced-sodium canned vegetables. Fruits All fresh, dried, or frozen fruit. Canned fruit in natural juice (without added sugar). Meat and other protein foods Skinless chicken or [place]. Ground chicken or [place]. Pork with fat trimmed off. Fish and seafood. Egg whites. Dried beans, peas, or lentils. Unsalted nuts, nut butters, and seeds. Unsalted canned beans. Lean cuts of beef with fat trimmed off. Low-sodium, lean deli meat. Dairy Low-fat (1%) or fat-free (skim) milk. Fat-free, low-fat, or reduced-fat cheeses. Nonfat, low-sodium ricotta or cottage cheese. Low-fat or nonfat yogurt. Low-fat, low-sodium cheese. Fats and oils Soft margarine without trans fats. Vegetable oil. Low-fat, reduced-fat, or light mayonnaise and salad dressings (reduced-sodium). Canola, safflower, olive, soybean, and sunflower oils. Avocado. Seasoning and other foods Herbs. Spices. Seasoning mixes without salt. Unsalted popcorn and pretzels. Fat-free sweets. What foods are not recommended? The items  listed may not be a complete list. Talk with your dietitian about what dietary choices are best for you. Grains Baked goods made with fat, such as croissants, muffins, or some breads. Dry pasta or rice meal packs. Vegetables Creamed or fried vegetables. Vegetables in a cheese sauce. Regular canned vegetables (not low-sodium or reduced-sodium). Regular canned tomato sauce and paste (not low-sodium or reduced-sodium). Regular tomato and vegetable juice (not low-sodium or reduced-sodium). Angie Fava. Olives. Fruits Canned fruit in a light or heavy syrup. Fried fruit. Fruit in cream or butter sauce. Meat and other protein foods Fatty cuts of meat. Ribs. Fried meat. Berniece Salines. Sausage. Bologna and other processed lunch meats. Salami. Fatback. Hotdogs. Bratwurst. Salted nuts and seeds. Canned beans with added salt. Canned or smoked fish. Whole eggs or egg yolks. Chicken or [place] with skin. Dairy Whole or 2% milk, cream, and half-and-half. Whole or full-fat cream cheese. Whole-fat or sweetened yogurt. Full-fat cheese. Nondairy creamers. Whipped toppings. Processed cheese and cheese spreads. Fats and oils Butter. Stick margarine. Lard. Shortening. Ghee. Bacon fat. Tropical oils, such as coconut, palm kernel, or palm oil. Seasoning and other foods Salted popcorn and pretzels. Onion salt, garlic salt, seasoned salt, table salt, and sea salt. Worcestershire sauce. Tartar sauce. Barbecue sauce. Teriyaki sauce. Soy sauce, including reduced-sodium. Steak sauce. Canned and packaged gravies. Fish sauce. Oyster sauce. Cocktail sauce. Horseradish that you find on the shelf. Ketchup. Mustard. Meat flavorings and tenderizers. Bouillon cubes. Hot sauce and Tabasco sauce. Premade or packaged marinades. Premade or packaged taco seasonings. Relishes. Regular salad dressings. Where to find more information:  National Heart, Lung, and [place]: https://wilson-eaton.com/  American Heart Association: www.heart.org Summary  The  DASH eating plan is a healthy eating plan that has been shown to reduce high blood pressure (hypertension). It may also reduce your risk for type 2 diabetes, heart disease, and stroke.  With the DASH eating plan, you should limit salt (sodium) intake to 2,300 mg a day. If you have hypertension, you may need to reduce your sodium intake to 1,500 mg a day.  When on the DASH eating plan, aim to eat more fresh fruits and vegetables, whole grains, lean proteins, low-fat dairy, and heart-healthy fats.  Work with your health care provider or diet and nutrition specialist (dietitian) to adjust your eating plan to your individual calorie needs. This information is not intended to replace advice given to you by your health care provider. Make sure you discuss any questions you have with your health care provider. Document Released: 06/09/2011 Document Revised: 06/13/2016 Document Reviewed: 06/13/2016 Elsevier Interactive Patient Education  2018 [place].

## 2018-04-09 NOTE — Telephone Encounter (Signed)
Pt aware of increase and appt scheduled for 04/13/18 @ 8:30

## 2018-04-09 NOTE — Telephone Encounter (Signed)
Please increase the insulin to 40 units with breakfast, and 20 units with supper. Ov in 3-4 days.

## 2018-04-09 NOTE — Telephone Encounter (Signed)
Tervisery from [facility] called and stated that pt has had b/s running form 300-400 and wanted to know if pt should come in before 2 months to follow up or if dosage should be adjusted, please advise

## 2018-04-13 ENCOUNTER — Ambulatory Visit (INDEPENDENT_AMBULATORY_CARE_PROVIDER_SITE_OTHER): Payer: Medicare HMO | Admitting: Endocrinology

## 2018-04-13 ENCOUNTER — Encounter: Payer: Self-pay | Admitting: Endocrinology

## 2018-04-13 ENCOUNTER — Encounter: Payer: Medicare HMO | Admitting: *Deleted

## 2018-04-13 VITALS — BP 106/72 | HR 76 | Ht 67.0 in | Wt 165.0 lb

## 2018-04-13 DIAGNOSIS — E1140 Type 2 diabetes mellitus with diabetic neuropathy, unspecified: Secondary | ICD-10-CM | POA: Diagnosis not present

## 2018-04-13 DIAGNOSIS — Z794 Long term (current) use of insulin: Secondary | ICD-10-CM

## 2018-04-13 DIAGNOSIS — Z006 Encounter for examination for normal comparison and control in clinical research program: Secondary | ICD-10-CM

## 2018-04-13 MED ORDER — INSULIN NPH ISOPHANE & REGULAR (70-30) 100 UNIT/ML ~~LOC~~ SUSP: SUBCUTANEOUS | 3 refills | Status: DC

## 2018-04-13 NOTE — Patient Instructions (Addendum)
check your blood sugar twice a day.  vary the time of day when you check, between before the 3 meals, and at bedtime.  also check if you have symptoms of your blood sugar being too high or too low.  please keep a record of the readings and bring it to your next appointment here (or you can bring the meter itself).  You can write it on any piece of paper.  please call us sooner if your blood sugar goes below 70, or if you have a lot of readings over 200. Please increase the insulin to 70 units with breakfast, and 30 units with supper.   Please come back for a follow-up appointment in 1 month.

## 2018-04-13 NOTE — Progress Notes (Signed)
Subjective:    Patient ID: Calvin [name], male    DOB: September 30, 1960, 57 y.o.   MRN: [id number]  HPI  Pt returns for f/u of diabetes mellitus: DM type: Insulin-requiring type 2. Dx'ed: 6433 Complications: polyneuropathy, CAD, and DR Therapy: insulin since soon after dx.  DKA: never Severe hypoglycemia: never Pancreatitis: never Pancreatic imaging: normal on 2003 CT. Other: he takes human insulin, due to cost; due to noncompliance, he is not a candidate for multiple daily injections.   Interval history:  Despite the recent increase in insulin, cbg's are still in the 300's.  He says the insulin does not leak out.  He says he never misses the insulin.   Past Medical History:  Diagnosis Date  . Adenoidal hypertrophy   . Adenomatous colon polyps 2012  . Arthritis   . Diabetes mellitus   . GERD (gastroesophageal reflux disease)   . Heart attack ([place])    While living in [place]  [name] Hx of adenomatous colonic polyps 08/18/2017  . Hyperlipidemia   . Hypertension   . Mild CAD    a. cath in 08/2017 showing mild nonobstructive CAD with scattered 20-30% stenosis.   . Nonischemic cardiomyopathy ([place])    a. EF 20-25% by echo in 09/2017 with cath showing mild CAD. b.  Last echo 12/2017 EF 35-40%, grade 2 DD.  [name] Obesity   . PAF (paroxysmal atrial fibrillation) ([place])   . Sleep apnea    cpap    Past Surgical History:  Procedure Laterality Date  . COLONOSCOPY  05/13/11   9 adenomas  . FOREARM SURGERY    . MUSCLE BIOPSY    . RIGHT/LEFT HEART CATH AND CORONARY ANGIOGRAPHY N/A 08/25/2017   Procedure: RIGHT/LEFT HEART CATH AND CORONARY ANGIOGRAPHY;  Surgeon: [name], MD;  Location: [facility];  Service: Cardiovascular;  Laterality: N/A;    Social History   Socioeconomic History  . Marital status: Single    Spouse name: Not on file  . Number of children: 0  . Years of education: Not on file  . Highest education level: Not on file  Occupational History  . Occupation:  Disability  Social Needs  . Financial resource strain: Not on file  . Food insecurity:    Worry: Not on file    Inability: Not on file  . Transportation needs:    Medical: Not on file    Non-medical: Not on file  Tobacco Use  . Smoking status: Never Smoker  . Smokeless tobacco: Never Used  Substance and Sexual Activity  . Alcohol use: No  . Drug use: No  . Sexual activity: Yes    Birth control/protection: None  Lifestyle  . Physical activity:    Days per week: Not on file    Minutes per session: Not on file  . Stress: Not on file  Relationships  . Social connections:    Talks on phone: Not on file    Gets together: Not on file    Attends religious service: Not on file    Active member of club or organization: Not on file    Attends meetings of clubs or organizations: Not on file    Relationship status: Not on file  . Intimate partner violence:    Fear of current or ex partner: Not on file    Emotionally abused: Not on file    Physically abused: Not on file    Forced sexual activity: Not on file  Other Topics Concern  .  Not on file  Social History Narrative   Social History      Diet?       Do you drink/eat things with caffeine? yes      Marital status?       single                             What year were you married?      Do you live in a house, apartment, assisted living, condo, trailer, etc.? yes      Is it one or more stories? One story      How many persons live in your home?      Do you have any pets in your home? (please list) none      Highest level of education completed? graduate      Current or past profession:      Do you exercise?            no                          Type & how often?      Advanced Directives      Do you have a living will? no      Do you have a DNR form?                                  If not, do you want to discuss one? no      Do you have signed POA/HPOA for forms? no      Functional Status      Do you have  difficulty bathing or dressing yourself? no      Do you have difficulty preparing food or eating? no      Do you have difficulty managing your medications? no      Do you have difficulty managing your finances? no      Do you have difficulty affording your medications? no    Current Outpatient Medications on File Prior to Visit  Medication Sig Dispense Refill  . apixaban (ELIQUIS) 5 MG TABS tablet Take 1 tablet (5 mg total) by mouth 2 (two) times daily. 60 tablet 0  . atorvastatin (LIPITOR) 40 MG tablet Take 1 tablet (40 mg total) by mouth daily at 6 PM. 30 tablet 0  . Blood Glucose Monitoring Suppl (TRUE METRIX AIR GLUCOSE METER) w/Device KIT 1 strip 3 (three) times daily by Does not apply route. Use 1 strip to check blood sugar three (3) times daily. Dx:E11.9 1 kit 0  . carvedilol (COREG) 3.125 MG tablet Take 3.125 mg by mouth 2 (two) times daily with a meal.    . cetirizine (ZYRTEC) 10 MG tablet Take 10 mg by mouth daily.    [name] donepezil (ARICEPT) 10 MG tablet Take 1 tablet (10 mg total) by mouth at bedtime. 30 tablet 11  . glucose blood (ONETOUCH VERIO) test strip 1 each by Other route 2 (two) times daily. And lancets 2/day. 100 each 12  . Insulin Syringe-Needle U-100 (INSULIN SYRINGE .3CC/29GX1/2") 29G X 1/2" 0.3 ML MISC Inject 1 Syringe 3 (three) times daily as directed. Check blood sugar three times daily. Dx: E11.9 100 each 3  . metFORMIN (GLUCOPHAGE) 500 MG tablet Take 2 tablets (1,000 mg total) by mouth 2 (two) times daily with a meal.  120 tablet 6  . nitroGLYCERIN (NITROSTAT) 0.4 MG SL tablet Place 1 tablet (0.4 mg total) under the tongue every 5 (five) minutes as needed for chest pain. 30 tablet 1   No current facility-administered medications on file prior to visit.     No Known Allergies  Family History  Problem Relation Age of Onset  . Heart disease Mother   . Heart attack Mother 67  . Hypertension Mother   . Hyperlipidemia Mother   . Diabetes Mother   . Heart  disease Father   . Heart attack Father 44  . Hypertension Father   . Hyperlipidemia Father   . Diabetes Father   . Heart attack Sister 71  . Colon cancer Neg Hx   . Stomach cancer Neg Hx   . Esophageal cancer Neg Hx   . Rectal cancer Neg Hx     BP 106/72 (BP Location: Right Arm)   Pulse 76   Ht _0  (1.702 m)   Wt 165 lb (74.8 kg)   SpO2 98%   BMI 25.84 kg/m    Review of Systems He denies hypoglycemia.      Objective:   Physical Exam VITAL SIGNS:  See vs page GENERAL: no distress Pulses: dorsalis pedis intact bilat.   MSK: no deformity of the feet CV: no leg edema Skin:  no ulcer on the feet.  normal color and temp on the feet. Neuro: sensation is intact to touch on the feet.   Lab Results  Component Value Date   CREATININE 1.07 03/10/2018   BUN 7 03/10/2018   NA 137 03/10/2018   K 3.8 03/10/2018   CL 104 03/10/2018   CO2 26 03/10/2018       Assessment & Plan:  Insulin-requiring type 2 DM, with DR: he needs increased rx.   Patient Instructions  check your blood sugar twice a day.  vary the time of day when you check, between before the 3 meals, and at bedtime.  also check if you have symptoms of your blood sugar being too high or too low.  please keep a record of the readings and bring it to your next appointment here (or you can bring the meter itself).  You can write it on any piece of paper.  please call us sooner if your blood sugar goes below 70, or if you have a lot of readings over 200. Please increase the insulin to 70 units with breakfast, and 30 units with supper.   Please come back for a follow-up appointment in 1 month.

## 2018-04-16 ENCOUNTER — Encounter: Payer: Self-pay | Admitting: *Deleted

## 2018-04-16 DIAGNOSIS — Z006 Encounter for examination for normal comparison and control in clinical research program: Secondary | ICD-10-CM

## 2018-04-16 NOTE — Research (Addendum)
Late entry:  Subject met inclusion and exclusion criteria.  The informed consent form, study requirements and expectations were reviewed with the subject and questions and concerns were addressed prior to the signing of the consent form.  The subject verbalized understanding of the trial requirements.  The subject agreed to participate in the [facility] 4 trial and signed the informed consent.  The informed consent was obtained prior to performance of any protocol-specific procedures for the subject.  A copy of the signed informed consent was given to the subject and a copy was placed in the subject's medical record.  [facility] 4 screening visit complete on Friday 04/13/18.

## 2018-04-16 NOTE — Research (Signed)
Late entry:  Calvin [name] came to the research clinic for a screening visit in the [facility] 4 research trial.  He was a screen failure due to total cholesterol level of 141 mg/dL.

## 2018-04-20 ENCOUNTER — Encounter: Payer: Self-pay | Admitting: Cardiology

## 2018-04-20 ENCOUNTER — Ambulatory Visit: Payer: Medicare HMO | Admitting: Cardiology

## 2018-04-20 VITALS — BP 114/74 | HR 93 | Ht 67.0 in | Wt 163.4 lb

## 2018-04-20 DIAGNOSIS — I2510 Atherosclerotic heart disease of native coronary artery without angina pectoris: Secondary | ICD-10-CM

## 2018-04-20 DIAGNOSIS — I5022 Chronic systolic (congestive) heart failure: Secondary | ICD-10-CM

## 2018-04-20 DIAGNOSIS — I10 Essential (primary) hypertension: Secondary | ICD-10-CM | POA: Diagnosis not present

## 2018-04-20 DIAGNOSIS — E785 Hyperlipidemia, unspecified: Secondary | ICD-10-CM

## 2018-04-20 MED ORDER — ATORVASTATIN CALCIUM 80 MG PO TABS: 80.0000 mg | ORAL_TABLET | Freq: Every day | ORAL | 3 refills | Status: DC

## 2018-04-20 NOTE — Progress Notes (Signed)
04/20/2018 Calvin [name]   1960/12/12  [id number]  Primary Physician [name], DO Primary Cardiologist: Dr. Angelena Form   Reason for Visit/CC: f/u for Atypical CP, also h/o NICM  HPI:  Calvin [name] is a 57 y.o. male who is being seen today for f/u for atypical CP.   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  This is a new diagnosis made earlier this year in February.  Cardiac catheterization was performed by Dr. Angelena Form on August 25, 2017 for exertional dyspnea and he was found to have mild nonobstructive coronary artery disease with 30% mid to distal RCA, 20% proximal to distal left circumflex and 30% mid LAD.  Echocardiogram showed severe LV dysfunction with an ejection fraction of 20 to 25%.  Filling pressures were normal.  Medical management for CAD and his cardiomyopathy were recommended.  Additional past medical history also includes poorly controlled type 2 diabetes and hyperlipidemia.  He was placed on guidelines medical therapy with an ACEi and BB, however med titration was limited due to soft BP. He eventually had to stop lisinopril due to low BP and dizziness. He has continued on Coreg however. He had a f/u 2D Echo in June that showed improvement in LVEF, up from 20-25% to 35-40%.   He was recently seen by Melina Copa, [name], in September for evaluation of chest pain. Atypical sharp chest pain. She felt this was likely noncardiac and perhaps acid reflux and instructed him to f/u with his PCP. However there was also concern for possible PAF, given reported history, thus she ordered at 30 day monitor to assess for arrhthymias as the cause of his symptoms. 30 day monitor was reviewed by Dr. Angelena Form. This showed no afib. Monitor showed SR and sinus tach.   He is back today for f/u. Still with daily sharp CP. Some occasional exertional dyspnea but denies resting dyspnea, orthopnea and PND. He tells me his PCP  is referring him to see a gastroenterologist and an endocrinologist.   Current Meds  Medication Sig  . apixaban (ELIQUIS) 5 MG TABS tablet Take 1 tablet (5 mg total) by mouth 2 (two) times daily.  [name] atorvastatin (LIPITOR) 40 MG tablet Take 1 tablet (40 mg total) by mouth daily at 6 PM.  . Blood Glucose Monitoring Suppl (TRUE METRIX AIR GLUCOSE METER) w/Device KIT 1 strip 3 (three) times daily by Does not apply route. Use 1 strip to check blood sugar three (3) times daily. Dx:E11.9  . carvedilol (COREG) 3.125 MG tablet Take 3.125 mg by mouth 2 (two) times daily with a meal.  . cetirizine (ZYRTEC) 10 MG tablet Take 10 mg by mouth daily.  [name] donepezil (ARICEPT) 10 MG tablet Take 1 tablet (10 mg total) by mouth at bedtime.  [name] glucose blood (ONETOUCH VERIO) test strip 1 each by Other route 2 (two) times daily. And lancets 2/day.  . insulin NPH-regular Human (NOVOLIN 70/30) (70-30) 100 UNIT/ML injection 70 units with breakfast, and 30 units with supper.  . Insulin Syringe-Needle U-100 (INSULIN SYRINGE .3CC/29GX1/2") 29G X 1/2" 0.3 ML MISC Inject 1 Syringe 3 (three) times daily as directed. Check blood sugar three times daily. Dx: E11.9  . metFORMIN (GLUCOPHAGE) 500 MG tablet Take 2 tablets (1,000 mg total) by mouth 2 (two) times daily with a meal.  . nitroGLYCERIN (NITROSTAT) 0.4 MG SL tablet Place 1 tablet (0.4 mg total) under the tongue every 5 (five) minutes as needed for chest pain.   No Known  Allergies Past Medical History:  Diagnosis Date  . Adenoidal hypertrophy   . Adenomatous colon polyps 2012  . Arthritis   . Diabetes mellitus   . GERD (gastroesophageal reflux disease)   . Heart attack ([place])    While living in [place]  [name] Hx of adenomatous colonic polyps 08/18/2017  . Hyperlipidemia   . Hypertension   . Mild CAD    a. cath in 08/2017 showing mild nonobstructive CAD with scattered 20-30% stenosis.   . Nonischemic cardiomyopathy ([place])    a. EF 20-25% by echo in 09/2017 with cath showing  mild CAD. b.  Last echo 12/2017 EF 35-40%, grade 2 DD.  [name] Obesity   . PAF (paroxysmal atrial fibrillation) ([place])   . Sleep apnea    cpap   Family History  Problem Relation Age of Onset  . Heart disease Mother   . Heart attack Mother 34  . Hypertension Mother   . Hyperlipidemia Mother   . Diabetes Mother   . Heart disease Father   . Heart attack Father 30  . Hypertension Father   . Hyperlipidemia Father   . Diabetes Father   . Heart attack Sister 69  . Colon cancer Neg Hx   . Stomach cancer Neg Hx   . Esophageal cancer Neg Hx   . Rectal cancer Neg Hx    Past Surgical History:  Procedure Laterality Date  . COLONOSCOPY  05/13/11   9 adenomas  . FOREARM SURGERY    . MUSCLE BIOPSY    . RIGHT/LEFT HEART CATH AND CORONARY ANGIOGRAPHY N/A 08/25/2017   Procedure: RIGHT/LEFT HEART CATH AND CORONARY ANGIOGRAPHY;  Surgeon: [name], MD;  Location: [facility];  Service: Cardiovascular;  Laterality: N/A;   Social History   Socioeconomic History  . Marital status: Single    Spouse name: Not on file  . Number of children: 0  . Years of education: Not on file  . Highest education level: Not on file  Occupational History  . Occupation: Disability  Social Needs  . Financial resource strain: Not on file  . Food insecurity:    Worry: Not on file    Inability: Not on file  . Transportation needs:    Medical: Not on file    Non-medical: Not on file  Tobacco Use  . Smoking status: Never Smoker  . Smokeless tobacco: Never Used  Substance and Sexual Activity  . Alcohol use: No  . Drug use: No  . Sexual activity: Yes    Birth control/protection: None  Lifestyle  . Physical activity:    Days per week: Not on file    Minutes per session: Not on file  . Stress: Not on file  Relationships  . Social connections:    Talks on phone: Not on file    Gets together: Not on file    Attends religious service: Not on file    Active member of club or organization: Not  on file    Attends meetings of clubs or organizations: Not on file    Relationship status: Not on file  . Intimate partner violence:    Fear of current or ex partner: Not on file    Emotionally abused: Not on file    Physically abused: Not on file    Forced sexual activity: Not on file  Other Topics Concern  . Not on file  Social History Narrative   Social History      Diet?  Do you drink/eat things with caffeine? yes      Marital status?       single                             What year were you married?      Do you live in a house, apartment, assisted living, condo, trailer, etc.? yes      Is it one or more stories? One story      How many persons live in your home?      Do you have any pets in your home? (please list) none      Highest level of education completed? graduate      Current or past profession:      Do you exercise?            no                          Type & how often?      Advanced Directives      Do you have a living will? no      Do you have a DNR form?                                  If not, do you want to discuss one? no      Do you have signed POA/HPOA for forms? no      Functional Status      Do you have difficulty bathing or dressing yourself? no      Do you have difficulty preparing food or eating? no      Do you have difficulty managing your medications? no      Do you have difficulty managing your finances? no      Do you have difficulty affording your medications? no     Review of Systems: General: negative for chills, fever, night sweats or weight changes.  Cardiovascular: negative for chest pain, dyspnea on exertion, edema, orthopnea, palpitations, paroxysmal nocturnal dyspnea or shortness of breath Dermatological: negative for rash Respiratory: negative for cough or wheezing Urologic: negative for hematuria Abdominal: negative for nausea, vomiting, diarrhea, bright red blood per rectum, melena, or  hematemesis Neurologic: negative for visual changes, syncope, or dizziness All other systems reviewed and are otherwise negative except as noted above.   Physical Exam:  Blood pressure 114/74, pulse 93, height 5' 7"  (1.702 m), weight 163 lb 6.4 oz (74.1 kg), SpO2 98 %.  General appearance: alert, cooperative and no distress Neck: no carotid bruit and no JVD Lungs: clear to auscultation bilaterally Heart: regular rate and rhythm, S1, S2 normal, no murmur, click, rub or gallop Extremities: extremities normal, atraumatic, no cyanosis or edema Pulses: 2+ and symmetric Skin: Skin color, texture, turgor normal. No rashes or lesions Neurologic: Grossly normal  EKG not performed -- personally reviewed   ASSESSMENT AND PLAN:   1. Chronic systolic heart failure/nonischemic cardiomyopathy: EF 20 to 25% by echocardiogram September 04, 2017.  Cardiac catheterization revealed mild nonobstructive CAD.   He was placed on guidelines medical therapy with an ACEi and BB, however med titration was limited due to soft BP. He eventually had to stop lisinopril due to low BP and dizziness. He has continued on Coreg however. He had a f/u 2D Echo in June that showed improvement in LVEF,  up from 20-25% to 35-40%.  He overall looks stable/ euvolemic. No significant dyspnea and no orthopnea, PND or LEE. Continue Coreg. Would prefer to keep on BB vs stopping to allow room in BP for low dose ACE-I, given his HR in the 90s and CAD. We discussed low salt diet and daily weights.   2.  CAD: cardiac catheterization August 25, 2017 showed 30% mid to distal RCA, 20% proximal to distal circumflex and 30% mid LAD disease.  Continue medical therapy and aggressive risk factor modification.  Continue aspirin, high-dose statin therapy, beta-blocker along with better control of his diabetes and hyperlipidemia. I am increasing Lipitor to 80 mg nightly. His PCP is referring him to see an endocrinologist.   3.  Type 2 diabetes: poorly  controlled.  Hemoglobin A1c remains elevated at 11.8. He is on insulin and metformin, closely followed by his PCP and is being referred to endocrinology.   4.  Hyperlipidemia: poorly controlled.  He has mild nonobstructive coronary artery disease and goal LDL is less than 70 mg/dL. Fasting lipid panel in August showed elevated LDL at 154 mg/dL.  He reports full compliance with Lipitor 40. Will increase to 80 mg. Repeat FLP and HFTs in 8 weeks.  5. Chest Pain: atypical CP, not c/w cardiac etiology. Recent LHC showed mild nonobstructive CAD. 30 day monitor showed no arrhthymias. ? Reflux. He tells me his PCP has placed referral for him to see GI.   6. PAF: reported history. He is on Eliquis for a/c. Recent 30 day monitor showed no Afib. Continue BB for rate control.    Follow-Up w/ Dr. Angelena Form in 6 months.   Calvin [name], M[facility] 04/20/2018 8:39 AM

## 2018-04-20 NOTE — Patient Instructions (Signed)
Medication Instructions:  Your physician has recommended you make the following change in your medication:  1. INCREASE LIPITOR TO 80 MG DAILY.   If you need a refill on your cardiac medications before your next appointment, please call your pharmacy.   Lab work: LABS TO BE DONE IN 8 WEEKS: FASTING LIPIDS AND LFTS  If you have labs (blood work) drawn today and your tests are completely normal, you will receive your results only by: [name] MyChart Message (if you have MyChart) OR . A paper copy in the mail If you have any lab test that is abnormal or we need to change your treatment, we will call you to review the results.  Testing/Procedures: NONE  Follow-Up: At [facility], you and your health needs are our priority.  As part of our continuing mission to provide you with exceptional heart care, we have created designated Provider Care Teams.  These Care Teams include your primary Cardiologist (physician) and Advanced Practice Providers (APPs -  Physician Assistants and Nurse Practitioners) who all work together to provide you with the care you need, when you need it. You will need a follow up appointment in 6 months.  Please call our office 2 months in advance to schedule this appointment.  You may see [name], MD or one of the following Advanced Practice Providers on your designated Care Team:   [place], [name] Melina Copa, [name] . Ermalinda Barrios, [name]  Any Other Special Instructions Will Be Listed Below (If Applicable).

## 2018-04-22 ENCOUNTER — Other Ambulatory Visit: Payer: Self-pay | Admitting: Internal Medicine

## 2018-04-22 DIAGNOSIS — Z794 Long term (current) use of insulin: Principal | ICD-10-CM

## 2018-04-22 DIAGNOSIS — E1140 Type 2 diabetes mellitus with diabetic neuropathy, unspecified: Secondary | ICD-10-CM

## 2018-04-24 ENCOUNTER — Encounter: Payer: Self-pay | Admitting: Cardiology

## 2018-04-26 ENCOUNTER — Encounter: Payer: Medicare HMO | Attending: Endocrinology | Admitting: Dietician

## 2018-04-26 ENCOUNTER — Encounter: Payer: Self-pay | Admitting: Dietician

## 2018-04-26 DIAGNOSIS — Z794 Long term (current) use of insulin: Secondary | ICD-10-CM | POA: Diagnosis not present

## 2018-04-26 DIAGNOSIS — E1140 Type 2 diabetes mellitus with diabetic neuropathy, unspecified: Secondary | ICD-10-CM | POA: Insufficient documentation

## 2018-04-26 DIAGNOSIS — Z713 Dietary counseling and surveillance: Secondary | ICD-10-CM | POA: Diagnosis not present

## 2018-04-26 NOTE — Patient Instructions (Addendum)
Increase your insulin to 70 units with breakfast and 30 units with supper.  (per [name] note 04/13/18)  Buy Glucose tabs or hard candy.  Always take your bag when you walk or take the bus and carry these items:  Meter  Glucose tabs or hard candy  Snack (such as Peanut butter crackers)  Water  To treat a low blood sugar: (less than 70)  Drink 1/2 cup juice OR  3 glucose tabs OR  5 hard candies Have a snack when your sugar is normal.  Continue to walk as recommended by your MD.  Continue to avoid added salt. Rinse canned food or choose frozen. Fresh meat when able or rinse canned. Choose brown rice, beans, or baked sweet potato with dinner

## 2018-04-26 NOTE — Progress Notes (Signed)
Diabetes Self-Management Education  Visit Type: First/Initial  Appt. Start Time: 0905 Appt. End Time: 1010  04/26/2018  Mr. Kedarius Aloisi, identified by name and date of birth, is a 57 y.o. male with a diagnosis of Diabetes: Type 2.  History includes CHF- EF 20-25%, HLD, GERD, OSA (does not wear c-pap-lost it and is to go to sleep study again). Labs noted 03/27/18 Fructosamine 367 Medications include:  Novolin 70/30 40 units in the am and 20 units q HS, Metformin, Aricept  Patient's 58 yo [name] lives with him most of the time.  Patient does the shopping and cooking.  He is on disability.  Sometimes does not have enough money for adequate food.  He gets $15 per month in food stamps. He complains of a poor appetite.  He takes the city bus or walks. He has been having memory problems and has an appointment with a neurologist.  He states that his granddaughter wants information provided during today's visit and plans on cooking him dinner at times.  Patient states that he is more a a meat person and does not care for a lot of vegetables.  Chicken is frozen, vegetables are canned.  He does not use added salt but due to walking or bus to get groceries, purchasing fresh meat has been an issue.   ASSESSMENT  Height 5' 7"  (1.702 m), weight 167 lb (75.8 kg). Body mass index is 26.16 kg/m.  Diabetes Self-Management Education - 04/26/18 0921      Visit Information   Visit Type  First/Initial      Initial Visit   Diabetes Type  Type 2    Are you currently following a meal plan?  No    Are you taking your medications as prescribed?  No   sometimes forgets insulin   Date Diagnosed  2007      Health Coping   How would you rate your overall health?  Poor      Psychosocial Assessment   Patient Belief/Attitude about Diabetes  Motivated to manage diabetes    Self-care barriers  Lack of material resources;Other (comment)   lack of knowledge, poor memory   Self-management support   Doctor's office    Other persons present  Patient    Patient Concerns  Glycemic Control;Nutrition/Meal planning    Special Needs  Simplified materials    Preferred Learning Style  No preference indicated    Learning Readiness  Ready    How often do you need to have someone help you when you read instructions, pamphlets, or other written materials from your doctor or pharmacy?  2 - Rarely    What is the last grade level you completed in school?  12th grade      Pre-Education Assessment   Patient understands the diabetes disease and treatment process.  Needs Review    Patient understands incorporating nutritional management into lifestyle.  Needs Review    Patient undertands incorporating physical activity into lifestyle.  Needs Review    Patient understands using medications safely.  Needs Review    Patient understands monitoring blood glucose, interpreting and using results  Needs Review    Patient understands prevention, detection, and treatment of acute complications.  Needs Review    Patient understands prevention, detection, and treatment of chronic complications.  Needs Review    Patient understands how to develop strategies to address psychosocial issues.  Needs Review    Patient understands how to develop strategies to promote health/change behavior.  Needs Review  Complications   Last HgB A1C per patient/outside source  11.9 %   03/14/18 decreased from 12.1% 03/02/18   How often do you check your blood sugar?  1-2 times/day    Fasting Blood glucose range (mg/dL)  >200   240-300 each am   Postprandial Blood glucose range (mg/dL)  >200   240-300 after lunch   Number of hypoglycemic episodes per month  0    Number of hyperglycemic episodes per week  21    Can you tell when your blood sugar is high?  Yes    Have you had a dilated eye exam in the past 12 months?  Yes    Have you had a dental exam in the past 12 months?  No    Are you checking your feet?  Yes    How many days  per week are you checking your feet?  7      Dietary Intake   Breakfast  often skips    Snack (morning)  NABS    Lunch  bologna sandwich, occasional chips or fruit    Snack (afternoon)  none    Dinner  baked chicken, canned green beans or canned peas rarely eats out    Snack (evening)  grapes or chips    Beverage(s)  diet sierra mist and flavored water      Exercise   Exercise Type  Light (walking / raking leaves)    How many days per week to you exercise?  7    How many minutes per day do you exercise?  45    Total minutes per week of exercise  315      Patient Education   Previous Diabetes Education  Yes (please comment)   Classes at [employer] years ago   Nutrition management   Role of diet in the treatment of diabetes and the relationship between the three main macronutrients and blood glucose level;Other (comment);Meal options for control of blood glucose level and chronic complications.   portion control, plate method, low sodium diet   Physical activity and exercise   Role of exercise on diabetes management, blood pressure control and cardiac health.    Medications  Reviewed patients medication for diabetes, action, purpose, timing of dose and side effects.    Monitoring  Purpose and frequency of SMBG.    Acute complications  Taught treatment of hypoglycemia - the 15 rule.    Chronic complications  Relationship between chronic complications and blood glucose control    Psychosocial adjustment  Worked with patient to identify barriers to care and solutions      Individualized Goals (developed by patient)   Nutrition  General guidelines for healthy choices and portions discussed;Other (comment)   low sodium   Physical Activity  Exercise 5-7 days per week;30 minutes per day    Medications  take my medication as prescribed    Monitoring   test my blood glucose as discussed    Problem Solving  food access    Reducing Risk  do foot checks daily;treat hypoglycemia with 15 grams  of carbs if blood glucose less than 94m/dL    Health Coping  discuss diabetes with (comment)   MD, RD, CDE     Post-Education Assessment   Patient understands the diabetes disease and treatment process.  Demonstrates understanding / competency    Patient understands incorporating nutritional management into lifestyle.  Needs Review    Patient undertands incorporating physical activity into lifestyle.  Demonstrates understanding /  competency    Patient understands using medications safely.  Needs Review    Patient understands monitoring blood glucose, interpreting and using results  Needs Review    Patient understands prevention, detection, and treatment of acute complications.  Demonstrates understanding / competency    Patient understands prevention, detection, and treatment of chronic complications.  Demonstrates understanding / competency    Patient understands how to develop strategies to address psychosocial issues.  Needs Review    Patient understands how to develop strategies to promote health/change behavior.  Needs Review      Outcomes   Expected Outcomes  Demonstrated interest in learning. Expect positive outcomes    Future DMSE  4-6 wks    Program Status  Completed       Individualized Plan for Diabetes Self-Management Training:   Learning Objective:  Patient will have a greater understanding of diabetes self-management. Patient education plan is to attend individual and/or group sessions per assessed needs and concerns.   Plan:   Patient Instructions  Increase your insulin to 70 units with breakfast and 30 units with supper.  (per [name] note 04/13/18)  Buy Glucose tabs or hard candy.  Always take your bag when you walk or take the bus and carry these items:  Meter  Glucose tabs or hard candy  Snack (such as Peanut butter crackers)  Water  To treat a low blood sugar: (less than 70)  Drink 1/2 cup juice OR  3 glucose tabs OR  5 hard candies Have a snack  when your sugar is normal.  Continue to walk as recommended by your MD.  Continue to avoid added salt. Rinse canned food or choose frozen. Fresh meat when able or rinse canned. Choose brown rice, beans, or baked sweet potato with dinner        Expected Outcomes:  Demonstrated interest in learning. Expect positive outcomes  Education material provided: My plate, Heart Healthy Consistant, Carbohydrate Nutrition Therapy, Food Pantry list and food resource sheet  If problems or questions, patient to contact team via:  Phone  Future DSME appointment: 4-6 wks

## 2018-05-04 ENCOUNTER — Telehealth: Payer: Self-pay

## 2018-05-04 ENCOUNTER — Ambulatory Visit (HOSPITAL_COMMUNITY)
Admission: RE | Admit: 2018-05-04 | Discharge: 2018-05-04 | Disposition: A | Discharge status: Home / Self Care | Payer: Medicare HMO | Source: Ambulatory Visit | Attending: Physician Assistant | Admitting: Physician Assistant

## 2018-05-04 DIAGNOSIS — N289 Disorder of kidney and ureter, unspecified: Secondary | ICD-10-CM

## 2018-05-04 DIAGNOSIS — I5022 Chronic systolic (congestive) heart failure: Secondary | ICD-10-CM | POA: Insufficient documentation

## 2018-05-04 DIAGNOSIS — I517 Cardiomegaly: Secondary | ICD-10-CM | POA: Diagnosis not present

## 2018-05-04 LAB — CREATININE, SERUM
Creatinine, Ser: 1.38 mg/dL — ABNORMAL HIGH (ref 0.61–1.24)
GFR calc Af Amer: >60 mL/min (ref >60)
GFR calc non Af Amer: 55 mL/min — ABNORMAL LOW (ref >60)

## 2018-05-04 MED ORDER — GADOBUTROL 1 MMOL/ML IV SOLN
10.0000 mL | Freq: Once | INTRAVENOUS | Status: AC | PRN
  Administered 2018-05-04: 9 mL via INTRAVENOUS

## 2018-05-04 NOTE — Telephone Encounter (Signed)
Notes recorded by Frederik Schmidt, RN on 05/04/2018 at 4:51 PM EDT The patient has been notified of the result and verbalized understanding. He will come in Monday 11/4 for a BMET and will refer him to [facility]. All questions (if any) were answered. Frederik Schmidt, RN 05/04/2018 4:49 PM   ------

## 2018-05-04 NOTE — Telephone Encounter (Signed)
Notes recorded by Frederik Schmidt, RN on 05/04/2018 at 4:35 PM EDT [id number] 11/1 ------

## 2018-05-04 NOTE — Telephone Encounter (Signed)
-----   Message from Charlie Pitter, [place] sent at 05/04/2018  4:09 PM EDT ----- See other result note on cardiac MRI for additional info on results This was done before cMRI - kidney function appears elevated compared to most recent value, but similar to 01/2018. Please get a full BMET to trend, can be Monday. Dayna Dunn [name]

## 2018-05-07 ENCOUNTER — Telehealth: Payer: Self-pay | Admitting: *Deleted

## 2018-05-07 ENCOUNTER — Other Ambulatory Visit: Payer: Medicare HMO | Admitting: *Deleted

## 2018-05-07 DIAGNOSIS — N289 Disorder of kidney and ureter, unspecified: Secondary | ICD-10-CM | POA: Diagnosis not present

## 2018-05-07 DIAGNOSIS — I5022 Chronic systolic (congestive) heart failure: Secondary | ICD-10-CM

## 2018-05-07 LAB — BASIC METABOLIC PANEL
BUN/Creatinine Ratio: 10 (ref 9–20)
BUN: 13 mg/dL (ref 6–24)
CO2: 23 mmol/L (ref 20–29)
Calcium: 9.1 mg/dL (ref 8.7–10.2)
Chloride: 99 mmol/L (ref 96–106)
Creatinine, Ser: 1.34 mg/dL — ABNORMAL HIGH (ref 0.76–1.27)
GFR calc Af Amer: 67 mL/min/{1.73_m2} (ref >59)
GFR calc non Af Amer: 58 mL/min/{1.73_m2} — ABNORMAL LOW (ref >59)
Glucose: 266 mg/dL — ABNORMAL HIGH (ref 65–99)
Potassium: 4.5 mmol/L (ref 3.5–5.2)
Sodium: 138 mmol/L (ref 134–144)

## 2018-05-07 NOTE — Telephone Encounter (Signed)
-----   Message from Charlie Pitter, [place] sent at 05/04/2018  4:07 PM EDT ----- Please let patient know cardiac MRI showed no specific infiltrative disease but his EF is even lower on this scan than before. I would suggest he get referred to CHF clinic given inability to tolerate traditional HF therapies and continued low EF. He may eventually require a defibrillator due to his low EF - a device that treats abnormal heart rhythms due to weak heart muscles - but will need to see if CHF clinic can offer any other insight. Will also cc to Brittainy to make her aware (as she has since seen him after I did). Dayna Dunn [name]

## 2018-05-09 ENCOUNTER — Other Ambulatory Visit: Payer: Self-pay

## 2018-05-09 ENCOUNTER — Ambulatory Visit (INDEPENDENT_AMBULATORY_CARE_PROVIDER_SITE_OTHER): Payer: Medicare HMO | Admitting: Neurology

## 2018-05-09 ENCOUNTER — Encounter: Payer: Self-pay | Admitting: Neurology

## 2018-05-09 VITALS — BP 110/68 | HR 90 | Ht 67.0 in | Wt 163.0 lb

## 2018-05-09 DIAGNOSIS — R202 Paresthesia of skin: Secondary | ICD-10-CM

## 2018-05-09 DIAGNOSIS — G3184 Mild cognitive impairment, so stated: Secondary | ICD-10-CM

## 2018-05-09 DIAGNOSIS — R200 Anesthesia of skin: Secondary | ICD-10-CM

## 2018-05-09 NOTE — Progress Notes (Signed)
NEUROLOGY CONSULTATION NOTE  Calvin [name] MRN: [id number] DOB: 11-Jan-1961  Referring provider: Melina Copa, [name] Primary care provider: Dr. [name]  Reason for consult:  Memory loss  Thank you for your kind referral of Calvin [name] for consultation of the above symptoms. Although his history is well known to you, please allow me to reiterate it for the purpose of our medical record. He is alone in the office today. Records and images were personally reviewed where available.  HISTORY OF PRESENT ILLNESS: This is a pleasant 57 year old right-handed man with a history of nonischemic cardiomyopathy, paroxysmal atrial fibrillation on anticoagulation with Eliquis, hypertension, hyperlipidemia, diabetes mellitus, sleep apnea, presenting for evaluation of memory loss. He started noticing memory changes around 1-1.5 years ago. He has started to need to write down when he takes his medications or doctors appointments, otherwise he would forget them. He lives with his goddaughter but manages bills, he states he forgets them at least once a month but tries to put them in a pile as a reminder. He stopped driving 6 years ago due to vision issues. He has noticed increased irritability over the same time period, he gets ill over little things several times a week. No hallucinations. He has poor appetite. He has a diagnosis of sleep apnea but has lost his machine several years ago. He started having heart issues in his early 47s. EF in March 2019 was 20-25%, improved to 35-40% on last echo done June 2019. He has difficulties with his glucose levels, most recent HbA1c last September 2019 was 11.9. He had an MMSE of 21/30 in December 2018 and has been taking Donepezil 66m daily without side effects.  He has had daily headaches for the past 2 years, usually with pressure over the right temporal region, lasting 2-3 hours, then coming back. No associated nausea/vomiting, photo/phonophobia. He does not  take any medications for the headaches. He gets lightheaded all the time and has a history of orthostatic hypotension. Vision is blurred without his glasses. No dysarthria/dysphagia, neck/back pain, bladder dysfunction. He has occasional constipation. He has had pain over the right side of his body from the arm, down his trunk and leg for the past 2-3 years. Both feet get numb. No anosmia. He has occasional tremors. His parents had memory issues. No history of significant head injuries or alcohol use.   I personally reviewed MRI/MRA brain done 10/2017 which did not show any acute changes, there was minimal chronic microvascular disease.  Laboratory Data: Lab Results  Component Value Date   HGBA1C 11.9 (H) 03/09/2018   Lab Results  Component Value Date   TSH 1.362 10/15/2017    PAST MEDICAL HISTORY: Past Medical History:  Diagnosis Date  . Adenoidal hypertrophy   . Adenomatous colon polyps 2012  . Arthritis   . Diabetes mellitus   . GERD (gastroesophageal reflux disease)   [place]  .Marland KitchenHx of adenomatous colonic polyps 08/18/2017  . Hyperlipidemia   . Hypertension   . Mild CAD    a. cath in 08/2017 showing mild nonobstructive CAD with scattered 20-30% stenosis.   . Nonischemic cardiomyopathy ([facility]    a. EF 20-25% by echo in 09/2017 with cath showing mild CAD. b.  Last echo 12/2017 EF 35-40%, grade 2 DD.  .Marland KitchenObesity   . [place]mal [place]   . Sleep apnea    cpap    PAST SURGICAL HISTORY: Past Surgical  History:  Procedure Laterality Date  . COLONOSCOPY  05/13/11   9 adenomas  . FOREARM SURGERY    . MUSCLE BIOPSY    . RIGHT/LEFT HEART CATH AND CORONARY ANGIOGRAPHY N/A 08/25/2017   Procedure: RIGHT/LEFT HEART CATH AND CORONARY ANGIOGRAPHY;  Surgeon: [name], MD;  Location: [facility];  Service: Cardiovascular;  Laterality: N/A;    MEDICATIONS: Current Outpatient Medications on File Prior to Visit    Medication Sig Dispense Refill  . apixaban (ELIQUIS) 5 MG TABS tablet Take 1 tablet (5 mg total) by mouth 2 (two) times daily. 60 tablet 0  . atorvastatin (LIPITOR) 80 MG tablet Take 1 tablet (80 mg total) by mouth daily. 90 tablet 3  . Blood Glucose Monitoring Suppl (TRUE METRIX AIR GLUCOSE METER) w/Device KIT 1 strip 3 (three) times daily by Does not apply route. Use 1 strip to check blood sugar three (3) times daily. Dx:E11.9 1 kit 0  . carvedilol (COREG) 3.125 MG tablet Take 3.125 mg by mouth 2 (two) times daily with a meal.    . cetirizine (ZYRTEC) 10 MG tablet Take 10 mg by mouth daily.    [name] donepezil (ARICEPT) 10 MG tablet Take 1 tablet (10 mg total) by mouth at bedtime. 30 tablet 11  . glucose blood (ONETOUCH VERIO) test strip 1 each by Other route 2 (two) times daily. And lancets 2/day. 100 each 12  . insulin NPH-regular Human (NOVOLIN 70/30) (70-30) 100 UNIT/ML injection 70 units with breakfast, and 30 units with supper. 40 mL 3  . Insulin Syringe-Needle U-100 (INSULIN SYRINGE .3CC/29GX1/2") 29G X 1/2" 0.3 ML MISC Inject 1 Syringe 3 (three) times daily as directed. Check blood sugar three times daily. Dx: E11.9 100 each 3  . metFORMIN (GLUCOPHAGE) 500 MG tablet TAKE 2 TABLETS(1000 MG) BY MOUTH TWICE DAILY WITH A MEAL 120 tablet 2  . nitroGLYCERIN (NITROSTAT) 0.4 MG SL tablet Place 1 tablet (0.4 mg total) under the tongue every 5 (five) minutes as needed for chest pain. (Patient not taking: Reported on 04/26/2018) 30 tablet 1   No current facility-administered medications on file prior to visit.     ALLERGIES: No Known Allergies  FAMILY HISTORY: Family History  Problem Relation Age of Onset  . Heart disease Mother   . Heart attack Mother 53  . Hypertension Mother   . Hyperlipidemia Mother   . Diabetes Mother   . Heart disease Father   . Heart attack Father 44  . Hypertension Father   . Hyperlipidemia Father   . Diabetes Father   . Heart attack Sister 76  . Colon cancer Neg  Hx   . Stomach cancer Neg Hx   . Esophageal cancer Neg Hx   . Rectal cancer Neg Hx     SOCIAL HISTORY: Social History   Socioeconomic History  . Marital status: Single    Spouse name: Not on file  . Number of children: 0  . Years of education: Not on file  . Highest education level: Not on file  Occupational History  . Occupation: Disability  Social Needs  . Financial resource strain: Not on file  . Food insecurity:    Worry: Not on file    Inability: Not on file  . Transportation needs:    Medical: Not on file    Non-medical: Not on file  Tobacco Use  . Smoking status: Never Smoker  . Smokeless tobacco: Never Used  Substance and Sexual Activity  . Alcohol use: No  .  Drug use: No  . Sexual activity: Yes    Birth control/protection: None  Lifestyle  . Physical activity:    Days per week: Not on file    Minutes per session: Not on file  . Stress: Not on file  Relationships  . Social connections:    Talks on phone: Not on file    Gets together: Not on file    Attends religious service: Not on file    Active member of club or organization: Not on file    Attends meetings of clubs or organizations: Not on file    Relationship status: Not on file  . Intimate partner violence:    Fear of current or ex partner: Not on file    Emotionally abused: Not on file    Physically abused: Not on file    Forced sexual activity: Not on file  Other Topics Concern  . Not on file  Social History Narrative   Social History      Diet?       Do you drink/eat things with caffeine? yes      Marital status?       single                             What year were you married?      Do you live in a house, apartment, assisted living, condo, trailer, etc.? yes      Is it one or more stories? One story      How many persons live in your home?      Do you have any pets in your home? (please list) none      Highest level of education completed? graduate      Current or past  profession:      Do you exercise?            no                          Type & how often?      Advanced Directives      Do you have a living will? no      Do you have a DNR form?                                  If not, do you want to discuss one? no      Do you have signed POA/HPOA for forms? no      Functional Status      Do you have difficulty bathing or dressing yourself? no      Do you have difficulty preparing food or eating? no      Do you have difficulty managing your medications? no      Do you have difficulty managing your finances? no      Do you have difficulty affording your medications? no    REVIEW OF SYSTEMS: Constitutional: No fevers, chills, or sweats, no generalized fatigue, change in appetite Eyes: No visual changes, double vision, eye pain Ear, nose and throat: No hearing loss, ear pain, nasal congestion, sore throat Cardiovascular: No chest pain, palpitations Respiratory:  No shortness of breath at rest or with exertion, wheezes GastrointestinaI: No nausea, vomiting, diarrhea, abdominal pain, fecal incontinence Genitourinary:  No dysuria, urinary retention or frequency Musculoskeletal:  No neck pain, back  pain Integumentary: No rash, pruritus, skin lesions Neurological: as above Psychiatric: No depression, insomnia, anxiety Endocrine: No palpitations, fatigue, diaphoresis, mood swings, change in appetite, change in weight, increased thirst Hematologic/Lymphatic:  No anemia, purpura, petechiae. Allergic/Immunologic: no itchy/runny eyes, nasal congestion, recent allergic reactions, rashes  PHYSICAL EXAM: Vitals:   05/09/18 1036  BP: 110/68  Pulse: 90  SpO2: 99%   General: No acute distress Head:  Normocephalic/atraumatic Eyes: Fundoscopic exam shows bilateral sharp discs, no vessel changes, exudates, or hemorrhages Neck: supple, no paraspinal tenderness, full range of motion Back: No paraspinal tenderness Heart: regular rate and rhythm Lungs:  Clear to auscultation bilaterally. Vascular: No carotid bruits. Skin/Extremities: No rash, no edema Neurological Exam: Mental status: alert and oriented to person, place, and time, no dysarthria or aphasia, Fund of knowledge is appropriate.  Recent and remote memory are intact.  Attention and concentration are reduced.    Able to name objects and repeat phrases. CDT 0/5, drew numbers counterclockwise MMSE - Mini Mental State Exam 05/09/2018 06/20/2017  Orientation to time 5 5  Orientation to Place 5 5  Registration 3 3  Attention/ Calculation 4 0  Recall 2 1  Language- name 2 objects 2 2  Language- repeat 1 1  Language- follow 3 step command 3 2  Language- read & follow direction 1 1  Write a sentence 1 0  Copy design 1 1  Total score 28 21    Cranial nerves: CN I: not tested CN II: pupils equal, round and reactive to light, visual fields intact, fundi unremarkable. CN III, IV, VI:  full range of motion, no nystagmus, no ptosis CN V: decreased pin on right V1, V2 CN VII: upper and lower face symmetric CN VIII: hearing intact to finger rub CN IX, X: gag intact, uvula midline CN XI: sternocleidomastoid and trapezius muscles intact CN XII: tongue midline Bulk & Tone: normal, no fasciculations. Motor: 5/5 throughout with no pronator drift. Sensation: decreased pin on left UE, decreased cold on right UE, decreased pin and cold on right LE, decreased vibration sense to knees bilaterally. Romberg test negative Deep Tendon Reflexes: +1 throughout except for absent ankle reflexes, no ankle clonus Plantar responses: downgoing bilaterally Cerebellar: no incoordination on finger to nose, heel to shin. No dysdiadochokinesia Gait: slow and cautious, favoring right leg, reporting pain. Difficulty with tandem walk Tremor: none  IMPRESSION: This is a pleasant 57 year old right-handed man with a history of  nonischemic cardiomyopathy, paroxysmal atrial fibrillation on anticoagulation with  Eliquis, hypertension, hyperlipidemia, diabetes mellitus, sleep apnea, presenting for evaluation of memory loss. 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 MMSE today is normal, 28/30. He is taking Donepezil 46m daily. We discussed different causes of memory loss, cognitive dysfunction can be seen in patients with heart failure, particularly decreased attention, memory loss, psychomotor slowing, and diminished executive functioning. Poorly controlled sleep apnea and diabetes can also cause cognitive issues. We discussed proceeding with sleep study and CPAP titration, continue with working with his cardiologist and endocrinologist, and will reassess on follow-up visit. We discussed right-sided pain and numbness, cervical MRI without contrast will be ordered to assess for underlying structural abnormality, he will be referred to PT. We discussed the importance of control of vascular risk factors, physical exercise, and brain stimulation exercises for brain health. He will follow-up in 6 months and knows to call for any changes.   Thank you for allowing me to participate  in the care of this patient. Please do not hesitate to call for any questions or concerns.   Ellouise Newer, M.D.  CC: Melina Copa, [name], Dr. [name]

## 2018-05-09 NOTE — Patient Instructions (Addendum)
1. Schedule MRI cervical spine without contrast 2. Refer to Physical Therapy for right arm and leg pain 3. Please contact the [facility] to schedule your sleep study at [phone] 4. Continue Donepezil 32m daily 5. Follow-up in 6 months, call for any changes  We have [facility] and they will call you directly to schedule your appt. They are located at [address] If you need to contact them directly please call [phone]    RECOMMENDATIONS FOR ALL PATIENTS WITH MEMORY PROBLEMS: 1. Continue to exercise (Recommend 30 minutes of walking everyday, or 3 hours every week) 2. Increase social interactions - continue going to [place]and enjoy social gatherings with friends and family 3. Eat healthy, avoid fried foods and eat more fruits and vegetables 4. Maintain adequate blood pressure, blood sugar, and blood cholesterol level. Reducing the risk of stroke and cardiovascular disease also helps promoting better memory. 5. Avoid stressful situations. Live a simple life and avoid aggravations. Organize your time and prepare for the next day in anticipation. 6. Sleep well, avoid any interruptions of sleep and avoid any distractions in the bedroom that may interfere with adequate sleep quality 7. Avoid sugar, avoid sweets as there is a strong link between excessive sugar intake, diabetes, and cognitive impairment The Mediterranean diet has been shown to help patients reduce the risk of progressive memory disorders and reduces cardiovascular risk. This includes eating fish, eat fruits and green leafy vegetables, nuts like almonds and hazelnuts, walnuts, and also use olive oil. Avoid fast foods and fried foods as much as possible. Avoid sweets and sugar as sugar use has been linked to worsening of memory function.

## 2018-05-14 ENCOUNTER — Other Ambulatory Visit: Payer: Medicare HMO

## 2018-05-16 ENCOUNTER — Ambulatory Visit: Payer: Medicare HMO | Admitting: Endocrinology

## 2018-05-16 DIAGNOSIS — Z0289 Encounter for other administrative examinations: Secondary | ICD-10-CM

## 2018-05-17 ENCOUNTER — Ambulatory Visit: Payer: Medicare HMO | Admitting: Podiatry

## 2018-05-21 ENCOUNTER — Ambulatory Visit: Payer: Self-pay

## 2018-05-21 ENCOUNTER — Ambulatory Visit: Payer: Medicare HMO | Admitting: Nurse Practitioner

## 2018-05-22 ENCOUNTER — Telehealth (HOSPITAL_COMMUNITY): Payer: Self-pay | Admitting: Vascular Surgery

## 2018-05-22 NOTE — Telephone Encounter (Signed)
Left pt message giving NEW CHF appt, asked pt to call back to confirm appt

## 2018-05-28 ENCOUNTER — Ambulatory Visit: Payer: Medicare HMO | Admitting: Dietician

## 2018-05-28 ENCOUNTER — Ambulatory Visit: Payer: Medicare HMO | Admitting: Endocrinology

## 2018-05-28 DIAGNOSIS — Z0289 Encounter for other administrative examinations: Secondary | ICD-10-CM

## 2018-06-13 ENCOUNTER — Ambulatory Visit
Admission: RE | Admit: 2018-06-13 | Discharge: 2018-06-13 | Disposition: A | Discharge status: Home / Self Care | Payer: Medicare HMO | Source: Ambulatory Visit | Attending: Neurology | Admitting: Neurology

## 2018-06-13 DIAGNOSIS — M4802 Spinal stenosis, cervical region: Secondary | ICD-10-CM | POA: Diagnosis not present

## 2018-06-13 DIAGNOSIS — R202 Paresthesia of skin: Principal | ICD-10-CM

## 2018-06-13 DIAGNOSIS — R200 Anesthesia of skin: Secondary | ICD-10-CM

## 2018-06-15 ENCOUNTER — Other Ambulatory Visit: Payer: Medicare HMO | Admitting: *Deleted

## 2018-06-15 DIAGNOSIS — I2510 Atherosclerotic heart disease of native coronary artery without angina pectoris: Secondary | ICD-10-CM | POA: Diagnosis not present

## 2018-06-15 DIAGNOSIS — I5022 Chronic systolic (congestive) heart failure: Secondary | ICD-10-CM

## 2018-06-15 DIAGNOSIS — E785 Hyperlipidemia, unspecified: Secondary | ICD-10-CM | POA: Diagnosis not present

## 2018-06-15 DIAGNOSIS — I10 Essential (primary) hypertension: Secondary | ICD-10-CM | POA: Diagnosis not present

## 2018-06-15 LAB — LIPID PANEL
Chol/HDL Ratio: 4.6 ratio (ref 0.0–5.0)
Cholesterol, Total: 244 mg/dL — ABNORMAL HIGH (ref 100–199)
HDL: 53 mg/dL (ref >39)
LDL Calculated: 177 mg/dL — ABNORMAL HIGH (ref 0–99)
Triglycerides: 72 mg/dL (ref 0–149)
VLDL Cholesterol Cal: 14 mg/dL (ref 5–40)

## 2018-06-15 LAB — HEPATIC FUNCTION PANEL
ALT: 17 IU/L (ref 0–44)
AST: 16 IU/L (ref 0–40)
Albumin: 4.2 g/dL (ref 3.5–5.5)
Alkaline Phosphatase: 82 IU/L (ref 39–117)
Bilirubin Total: 0.3 mg/dL (ref 0.0–1.2)
Bilirubin, Direct: 0.08 mg/dL (ref 0.00–0.40)
Total Protein: 6.3 g/dL (ref 6.0–8.5)

## 2018-06-18 ENCOUNTER — Ambulatory Visit: Payer: Medicare HMO | Admitting: Dietician

## 2018-06-18 ENCOUNTER — Telehealth: Payer: Self-pay

## 2018-06-18 DIAGNOSIS — E785 Hyperlipidemia, unspecified: Secondary | ICD-10-CM

## 2018-06-18 NOTE — Telephone Encounter (Signed)
Notes recorded by Frederik Schmidt, RN on 06/18/2018 at 8:54 AM EST The patient has been notified of the result and verbalized understanding. All questions (if any) were answered. Frederik Schmidt, RN 06/18/2018 8:54 AM

## 2018-06-18 NOTE — Telephone Encounter (Signed)
-----   Message from Consuelo Pandy, [place] sent at 06/15/2018  4:29 PM EST ----- His LDL remains too high. We have been following this for a while and he always reports compliance with his statin. He has been on a high dose and I don't think adding Zetia will give him the reduction that he needs. Refer to Lipid clinic for further management.

## 2018-06-21 ENCOUNTER — Telehealth: Payer: Self-pay

## 2018-06-21 DIAGNOSIS — R200 Anesthesia of skin: Secondary | ICD-10-CM

## 2018-06-21 DIAGNOSIS — R202 Paresthesia of skin: Principal | ICD-10-CM

## 2018-06-21 NOTE — Telephone Encounter (Signed)
Spoke with pt relaying message below.  Pt states that he has not heard from physical therapy as of yet.  Looking it pt's chart it does not appear that referral to PT was placed.  Will place referral now.

## 2018-06-21 NOTE — Telephone Encounter (Signed)
Order placed for PT

## 2018-06-21 NOTE — Telephone Encounter (Signed)
-----   Message from Cameron Sprang, MD sent at 06/14/2018 10:07 AM EST ----- Pls let him know the MRI cervical spine showed arthritis changes with pinched nerves in the neck, more on the right, which is why he has the numbness and pain. Proceed with PT if he has not started it yet. Thanks

## 2018-06-22 ENCOUNTER — Ambulatory Visit (HOSPITAL_COMMUNITY)
Admission: RE | Admit: 2018-06-22 | Discharge: 2018-06-22 | Disposition: A | Discharge status: Home / Self Care | Payer: Medicare HMO | Source: Ambulatory Visit | Attending: Internal Medicine | Admitting: Internal Medicine

## 2018-06-22 ENCOUNTER — Encounter (HOSPITAL_COMMUNITY): Payer: Self-pay | Admitting: Internal Medicine

## 2018-06-22 VITALS — BP 150/80 | HR 94 | Wt 166.2 lb

## 2018-06-22 DIAGNOSIS — I5042 Chronic combined systolic (congestive) and diastolic (congestive) heart failure: Secondary | ICD-10-CM | POA: Insufficient documentation

## 2018-06-22 DIAGNOSIS — Z8249 Family history of ischemic heart disease and other diseases of the circulatory system: Secondary | ICD-10-CM | POA: Insufficient documentation

## 2018-06-22 DIAGNOSIS — E119 Type 2 diabetes mellitus without complications: Secondary | ICD-10-CM | POA: Insufficient documentation

## 2018-06-22 DIAGNOSIS — E785 Hyperlipidemia, unspecified: Secondary | ICD-10-CM | POA: Insufficient documentation

## 2018-06-22 DIAGNOSIS — I110 Hypertensive heart disease with heart failure: Secondary | ICD-10-CM | POA: Insufficient documentation

## 2018-06-22 DIAGNOSIS — R413 Other amnesia: Secondary | ICD-10-CM | POA: Diagnosis not present

## 2018-06-22 DIAGNOSIS — Z9989 Dependence on other enabling machines and devices: Secondary | ICD-10-CM | POA: Insufficient documentation

## 2018-06-22 DIAGNOSIS — I428 Other cardiomyopathies: Secondary | ICD-10-CM

## 2018-06-22 DIAGNOSIS — Z7901 Long term (current) use of anticoagulants: Secondary | ICD-10-CM | POA: Insufficient documentation

## 2018-06-22 DIAGNOSIS — E669 Obesity, unspecified: Secondary | ICD-10-CM | POA: Insufficient documentation

## 2018-06-22 DIAGNOSIS — Z833 Family history of diabetes mellitus: Secondary | ICD-10-CM | POA: Insufficient documentation

## 2018-06-22 DIAGNOSIS — Z79899 Other long term (current) drug therapy: Secondary | ICD-10-CM | POA: Diagnosis not present

## 2018-06-22 DIAGNOSIS — I2510 Atherosclerotic heart disease of native coronary artery without angina pectoris: Secondary | ICD-10-CM | POA: Diagnosis not present

## 2018-06-22 DIAGNOSIS — G4730 Sleep apnea, unspecified: Secondary | ICD-10-CM | POA: Insufficient documentation

## 2018-06-22 DIAGNOSIS — Z794 Long term (current) use of insulin: Secondary | ICD-10-CM | POA: Insufficient documentation

## 2018-06-22 DIAGNOSIS — I252 Old myocardial infarction: Secondary | ICD-10-CM | POA: Insufficient documentation

## 2018-06-22 DIAGNOSIS — I5022 Chronic systolic (congestive) heart failure: Secondary | ICD-10-CM

## 2018-06-22 DIAGNOSIS — I10 Essential (primary) hypertension: Secondary | ICD-10-CM | POA: Diagnosis not present

## 2018-06-22 DIAGNOSIS — I480 Paroxysmal atrial fibrillation: Secondary | ICD-10-CM | POA: Insufficient documentation

## 2018-06-22 MED ORDER — LISINOPRIL 10 MG PO TABS: 10.0000 mg | ORAL_TABLET | Freq: Every day | ORAL | 6 refills | Status: DC

## 2018-06-22 NOTE — Progress Notes (Signed)
ADVANCED HF CLINIC CONSULT NOTE  Referring Physician: Primary Care: Dr [name]  Primary Cardiologist: Dr Angelena Form  Neurology: Dr Delice Lesch   HPI: Calvin [name] is a 57 year old referred to the HF clinic by Dr [name] for HF evaluation.   Calvin [name] is 57 year old with a history of chronic systolic heart failure due to NICM with EF 35-40%, uncontrolled DMII, GERD, HTN, MI 2015, memory issues, and hyperlipidemia. Of note he was homeless for 6 years but was able to get housing in February of this year.   Admitted with 08/2017 with chest pain. 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  Readmitted in 10/2017 with chest pain. CTA was negative for PE. Marland KitchenHe was also admitted in 03/2018 of this year with chest pain. He had a brief episode of A fib and was started on eliquis and carvedilol was increased to 6.25 mg twice a day.  This was not thought to be ACS.   In the past he was on entresto but this was later stopped due to dizziness.  He was last seen by Ellen Henri [name] on 04/20/2018. From HF perspective he was stable. he was stable at that time. He was referred to HF clinic at that time. In November he was seen by Neurology for memory loss. He has been donepezil for several months. He was referred for sleep study.   Today he is feeling ok. Complaining of dizziness and headache. Complains of day time fatigue. SOB walking up steps. Denies chest pain. Denies fever of chills. He does not have a scale. Says he has had trouble taking his medication but he forgets his medications a lot. He has trouble paying for medications.  Says he can read a little. He asks his family to read medication bottles. He has trouble paying for medications and paying for food. He walks to many places and occasionally takes the bus. .  SH: Disabled for 6 years due to diabetes. He does not smoke. Denies cocaine abuse.   ECHO 12/2017  EF 35-40% Grade II DD. Moderate global reduction in LV systolic  function; moderate   diastolic dysfunction. ECHO 09/2017  EF 20-25% Grade IDD  LHC/RHC 08/2017  RA 5  PA 22/11 (15)  PCWP 9  CO 6 CI 3/14   Prox RCA to Mid RCA lesion is 30% stenosed.  Mid RCA to Dist RCA lesion is 30% stenosed.  Prox Cx to Dist Cx lesion is 20% stenosed.  Mid LAD lesion is 30% stenosed.  There is mild to moderate left ventricular systolic dysfunction.  LV end diastolic pressure is mildly elevated.  The left ventricular ejection fraction is 35-45% by visual estimate.  There is no mitral valve regurgitation.     Review of Systems: [y] = yes, _0  = no   General: Weight gain _1 ; Weight loss _2 ; Anorexia _3 ; Fatigue [ Y]; Fever _4 ; Chills _5 ; Weakness [ Y]  Cardiac: Chest pain/pressure _6 ; Resting SOB _7 ; Exertional SOB [ Y]; Orthopnea _8 ; Pedal Edema _9 ; Palpitations _10 ; Syncope _11 ; Presyncope _12 ; Paroxysmal nocturnal dyspnea_13   Pulmonary: Cough _14 ; Wheezing_15 ; Hemoptysis_16 ; Sputum _17 ; Snoring _18   GI: Vomiting_19 ; Dysphagia_20 ; Melena_21 ; Hematochezia _22 ; Heartburn_23 ; Abdominal pain _24 ; Constipation _25 ; Diarrhea _26 ; BRBPR _27   GU: Hematuria_28 ; Dysuria _29 ; Nocturia_30   Vascular: Pain in legs with  walking _0 ; Pain in feet with lying flat _1 ; Non-healing sores _2 ; Stroke _3 ; TIA _4 ; Slurred speech _5 ;  Neuro: Headaches_6 ; Vertigo_7 ; Seizures_8 ; Paresthesias_9 ;Blurred vision _10 ; Diplopia _11 ; Vision changes _12   Ortho/Skin: Arthritis _13 ; Joint pain [ Y]; Muscle pain _14 ; Joint swelling _15 ; Back Pain [ Y]; Rash _16   Psych: Depression_17 ; Anxiety_18   Heme: Bleeding problems _19 ; Clotting disorders _20 ; Anemia _21   Endocrine: Diabetes [Y ]; Thyroid dysfunction_22    Past Medical History:  Diagnosis Date  . Adenoidal hypertrophy   . Adenomatous colon polyps 2012  . Arthritis   . Diabetes mellitus   . GERD (gastroesophageal reflux disease)   . Heart attack ([place])    [place] living in [place]  [name] Hx of adenomatous colonic polyps  08/18/2017  . Hyperlipidemia   . Hypertension   . Mild CAD    a. cath in 08/2017 showing mild nonobstructive CAD with scattered 20-30% stenosis.   . Nonischemic cardiomyopathy ([place])    a. EF 20-25% by echo in 09/2017 with cath showing mild CAD. b.  Last echo 12/2017 EF 35-40%, grade 2 DD.  [name] Obesity   . PAF (paroxysmal atrial fibrillation) ([place])   . Sleep apnea    cpap    Current Outpatient Medications  Medication Sig Dispense Refill  . apixaban (ELIQUIS) 5 MG TABS tablet Take 1 tablet (5 mg total) by mouth 2 (two) times daily. 60 tablet 0  . atorvastatin (LIPITOR) 40 MG tablet   6  . Blood Glucose Monitoring Suppl (TRUE METRIX AIR GLUCOSE METER) w/Device KIT 1 strip 3 (three) times daily by Does not apply route. Use 1 strip to check blood sugar three (3) times daily. Dx:E11.9 1 kit 0  . carvedilol (COREG) 3.125 MG tablet Take 3.125 mg by mouth 2 (two) times daily with a meal.    . cetirizine (ZYRTEC) 10 MG tablet Take 10 mg by mouth daily.    [name] donepezil (ARICEPT) 10 MG tablet Take 1 tablet (10 mg total) by mouth at bedtime. 30 tablet 11  . glucose blood (ONETOUCH VERIO) test strip 1 each by Other route 2 (two) times daily. And lancets 2/day. 100 each 12  . insulin NPH-regular Human (NOVOLIN 70/30) (70-30) 100 UNIT/ML injection 70 units with breakfast, and 30 units with supper. 40 mL 3  . Insulin Syringe-Needle U-100 (INSULIN SYRINGE .3CC/29GX1/2") 29G X 1/2" 0.3 ML MISC Inject 1 Syringe 3 (three) times daily as directed. Check blood sugar three times daily. Dx: E11.9 100 each 3  . lisinopril (PRINIVIL,ZESTRIL) 5 MG tablet TK 1 T PO D FOR BLOOD PRESSURE AND KIDNEY PROTECTION  9  . metFORMIN (GLUCOPHAGE) 500 MG tablet TAKE 2 TABLETS(1000 MG) BY MOUTH TWICE DAILY WITH A MEAL 120 tablet 2  . nitroGLYCERIN (NITROSTAT) 0.4 MG SL tablet Place 1 tablet (0.4 mg total) under the tongue every 5 (five) minutes as needed for chest pain. (Patient not taking: Reported on 06/22/2018) 30 tablet 1   No  current facility-administered medications for this encounter.     No Known Allergies    Social History   Socioeconomic History  . Marital status: Single    Spouse name: Not on file  . Number of children: 0  . Years of education: Not on file  . Highest education level: Not on file  Occupational History  . Occupation: Disability  Social Needs  . Emergency planning/management officer  strain: Not on file  . Food insecurity:    Worry: Not on file    Inability: Not on file  . Transportation needs:    Medical: Not on file    Non-medical: Not on file  Tobacco Use  . Smoking status: Never Smoker  . Smokeless tobacco: Never Used  Substance and Sexual Activity  . Alcohol use: No  . Drug use: No  . Sexual activity: Yes    Birth control/protection: None  Lifestyle  . Physical activity:    Days per week: Not on file    Minutes per session: Not on file  . Stress: Not on file  Relationships  . Social connections:    Talks on phone: Not on file    Gets together: Not on file    Attends religious service: Not on file    Active member of club or organization: Not on file    Attends meetings of clubs or organizations: Not on file    Relationship status: Not on file  . Intimate partner violence:    Fear of current or ex partner: Not on file    Emotionally abused: Not on file    Physically abused: Not on file    Forced sexual activity: Not on file  Other Topics Concern  . Not on file  Social History Narrative   Social History      Diet?       Do you drink/eat things with caffeine? yes      Marital status?       single                             What year were you married?      Do you live in a house, apartment, assisted living, condo, trailer, etc.? yes      Is it one or more stories? One story      How many persons live in your home?      Do you have any pets in your home? (please list) none      Highest level of education completed? graduate      Current or past profession:      Do  you exercise?            no                          Type & how often?      Advanced Directives      Do you have a living will? no      Do you have a DNR form?                                  If not, do you want to discuss one? no      Do you have signed POA/HPOA for forms? no      Functional Status      Do you have difficulty bathing or dressing yourself? no      Do you have difficulty preparing food or eating? no      Do you have difficulty managing your medications? no      Do you have difficulty managing your finances? no      Do you have difficulty affording your medications? no      Family History  Problem Relation Age of  Onset  . Heart disease Mother   . Heart attack Mother 57  . Hypertension Mother   . Hyperlipidemia Mother   . Diabetes Mother   . Heart disease Father   . Heart attack Father 13  . Hypertension Father   . Hyperlipidemia Father   . Diabetes Father   . Heart attack Sister 7  . Colon cancer Neg Hx   . Stomach cancer Neg Hx   . Esophageal cancer Neg Hx   . Rectal cancer Neg Hx     Vitals:   06/22/18 1124  BP: (!) 150/80  Pulse: 94  SpO2: 98%  Weight: 75.4 kg (166 lb 3.2 oz)   Wt Readings from Last 3 Encounters:  06/22/18 75.4 kg (166 lb 3.2 oz)  05/09/18 73.9 kg (163 lb)  04/26/18 75.8 kg (167 lb)     PHYSICAL EXAM: General:  Well appearing. No resp difficulty HEENT: normal Neck: supple. no JVD. Carotids 2+ bilat; no bruits. No lymphadenopathy or thryomegaly appreciated. Cor: PMI nondisplaced. Regular rate & rhythm. No rubs, gallops or murmurs. Lungs: clear Abdomen: soft, nontender, nondistended. No hepatosplenomegaly. No bruits or masses. Good bowel sounds. Extremities: no cyanosis, clubbing, rash, edema Neuro: alert & orientedx3, cranial nerves grossly intact. moves all 4 extremities w/o difficulty. Affect pleasant   ECG: NSR 94 bpm  Personally reviewed.    ASSESSMENT & PLAN: 1. Chronic Systolic /Diastolic Heart Failure,  NICM ECHO 12/2017 EF 35-40%. Has LHC in February of this year with  Nonobstructive CAD. Plan to repeat ECHO after HF meds optimized.  NYHA II-III.  -Continue carvedilol 3.125 mg twice a day.  - Increase lisinopril to 10 mg at bed time. Repeat BMET in 7 days. He was intolerant entresto due to dizziness.  - He is not volume overloaded and does not need diuretics.  - Would not add spiro at this time due to compliance issues.   2. HTN Elevated but he has not had his medications. Increase lisinopril as abov.e  3. Memory Loss On Aricept per Neurology  - He will need help with medications.   4. Uncotrolled DM hgb A1C >11.   5. Daytime faitue He has been referred for sleep study   Today he was referred to HF Paramedicine. We need to know how/when he is taking his medications. Today he was provided with a pill box.  He will need a scale and we ask our Paramedic to provide. Many social barriers to care. He is at high risk for readmit due to numerous social/medical issues.   Follow up in the APP clinic in 4-6 weeks.   Amy Clegg NP-C  Patient seen and examined with the above-signed Advanced Practice Provider and/or Housestaff. I personally reviewed laboratory data, imaging studies and relevant notes. I independently examined the patient and formulated the important aspects of the plan. I have edited the note to reflect any of my changes or salient points. I have personally discussed the plan with the patient and/or family.  57 y/o male with HTN, DM2, memory loss, frequent noncardiac CP, chronic dizziness and systolic HF due to NICM referred for HF evaluation. He has limited insight into his health issues. Overall seems to be doing ok with NYHA II symptoms but medical therapy limited by dizziness which does not appear to be orthostatic in nature. Volume status ok.   We will need to try to increase his medication regimen slowly but steadily but I think this may be a challenge given his dizziness and  limited  insight. Will increase lisinopril to 108m daily today and engage Paramedicine program for him.   I have reviewed echo and cath images personally.   DGlori Bickers MD  7:20 PM

## 2018-06-22 NOTE — Patient Instructions (Signed)
You have been referred to paramedicine. They will call you to schedule an appointment  INCREASE lisinopril 12m (1 tab) daily at bedtime  Lab work will need to be done in 1 week.  Follow up with the Advanced Practice Provider in 4 weeks

## 2018-06-25 ENCOUNTER — Telehealth (HOSPITAL_COMMUNITY): Payer: Self-pay

## 2018-06-25 NOTE — Telephone Encounter (Signed)
Contacted pt regarding new refer to paramedicine program, due to time off this week I will see pt next Monday for home visit. Pt was very interested and looking forward to it.   Marylouise Stacks, EMT-Paramedic  06/25/18

## 2018-06-26 ENCOUNTER — Ambulatory Visit: Payer: Medicare HMO | Attending: Neurology | Admitting: Physical Therapy

## 2018-06-26 ENCOUNTER — Encounter: Payer: Self-pay | Admitting: Physical Therapy

## 2018-06-26 ENCOUNTER — Other Ambulatory Visit: Payer: Self-pay

## 2018-06-26 DIAGNOSIS — M542 Cervicalgia: Secondary | ICD-10-CM | POA: Insufficient documentation

## 2018-06-26 DIAGNOSIS — R208 Other disturbances of skin sensation: Secondary | ICD-10-CM | POA: Insufficient documentation

## 2018-06-26 DIAGNOSIS — R293 Abnormal posture: Secondary | ICD-10-CM | POA: Diagnosis not present

## 2018-06-26 DIAGNOSIS — R29818 Other symptoms and signs involving the nervous system: Secondary | ICD-10-CM | POA: Diagnosis not present

## 2018-06-26 NOTE — Therapy (Signed)
[facility] [address] [place], [place], [address] Phone: [phone]   Fax:  [phone]  Physical Therapy Evaluation  Patient Details  Name: Calvin [name] MRN: [id number] Date of Birth: 1960/08/06 Referring Provider (PT): Cameron Sprang, MD   Encounter Date: 06/26/2018  PT End of Session - 06/26/18 1531    Visit Number  1    Number of Visits  5    Date for PT Re-Evaluation  07/26/18    Authorization Type  [employer] $40 copay    Authorization Time Period  06/26/18 to 09/24/2018    PT Start Time  1025    PT Stop Time  1108    PT Time Calculation (min)  43 min    Activity Tolerance  Patient limited by pain    Behavior During Therapy  [facility] for tasks assessed/performed       Past Medical History:  Diagnosis Date  . Adenoidal hypertrophy   . Adenomatous colon polyps 2012  . Arthritis   . Diabetes mellitus   . GERD (gastroesophageal reflux disease)   . Heart attack ([place])    [place] living [place]  [name] Hx of adenomatous colonic polyps 08/18/2017  . Hyperlipidemia   . Hypertension   . Mild CAD    a. cath in 08/2017 showing mild nonobstructive CAD with scattered 20-30% stenosis.   . Nonischemic cardiomyopathy ([place])    a. EF 20-25% by echo in 09/2017 with cath showing mild CAD. b.  Last echo 12/2017 EF 35-40%, grade 2 DD.  [name] Obesity   . PAF (paroxysmal atrial fibrillation) ([place])   . Sleep apnea    cpap    Past Surgical History:  Procedure Laterality Date  . COLONOSCOPY  05/13/11   9 adenomas  . FOREARM SURGERY    . MUSCLE BIOPSY    . RIGHT/LEFT HEART CATH AND CORONARY ANGIOGRAPHY N/A 08/25/2017   Procedure: RIGHT/LEFT HEART CATH AND CORONARY ANGIOGRAPHY;  Surgeon: [name], MD;  Location: [facility];  Service: Cardiovascular;  Laterality: N/A;    There were no vitals filed for this visit.   Subjective Assessment - 06/26/18 1029    Subjective  It's been going on for years.  Numbness and tingling on the entire rt side of body. Asking the purpose of PT interventions and what to expect    Pertinent History  PMH-chronic systolic heart failure due to NICM with EF 35-40%, uncontrolled DMII, GERD, HTN, MI 2015, memory issues, and hyperlipidemia    Diagnostic tests  MRI cervical spine 06/13/18 "No compressive central canal stenosis. Foraminal narrowing that could possibly cause neural compression at C3-4, C4-5 and C5-6."    Currently in Pain?  Yes    Pain Score  9     Pain Location  Arm    Pain Orientation  Right    Pain Descriptors / Indicators  Aching;Tingling;Numbness    Pain Type  Chronic pain    Pain Onset  More than a month ago    Pain Frequency  Constant    Aggravating Factors   none    Pain Relieving Factors  none    Effect of Pain on Daily Activities  stopped me from coaching basketball; I don't do hardly do any activities anymore as the pain takes my energy away         [facility] PT Assessment - 06/26/18 1031      Assessment   Medical Diagnosis  Numbness and tingling of right arm  and leg    Referring Provider (PT)  Cameron Sprang, MD    Onset Date/Surgical Date  --   MD referral 06/21/18   Hand Dominance  Right    Prior Therapy  none      Precautions   Precautions  Fall      Balance Screen   Has the patient fallen in the past 6 months  No    Has the patient had a decrease in activity level because of a fear of falling?   No    Is the patient reluctant to leave their home because of a fear of falling?   No      Prior Function   Level of Independence  Independent    Vocation  On disability   heart trouble     Cognition   Overall Cognitive Status  Within Functional Limits for tasks assessed      Sensation   Light Touch  Impaired Detail   hypersensitive entire rt side     Posture/Postural Control   Posture/Postural Control  Postural limitations    Postural Limitations  Rounded Shoulders;Forward head      ROM / Strength   AROM / PROM /  Strength  AROM      AROM   Overall AROM   Deficits    AROM Assessment Site  Cervical    Cervical Flexion  8    Cervical Extension  23    Cervical - Right Side Bend  25    Cervical - Left Side Bend  35    Cervical - Right Rotation  32    Cervical - Left Rotation  33      Palpation   Palpation comment  palpation of cervical paraspinals and upper trapezius with tight bands and limited tolerance for pressure/palpation on his rt side      Special Tests    Special Tests  Cervical    Cervical Tests  Spurling's;Dictraction;other      Spurling's   Findings  Positive    Side  Right   and left; rt worse     Distraction Test   Findngs  Negative    Comment  increases pain with distraction; relieves when released;       other    Findings  Positive    Side  --   bil   Comment  UE neural tension test      Bed Mobility   Bed Mobility  Supine to Sit;Sit to Supine    Supine to Sit  Independent    Sit to Supine  Independent      Transfers   Transfers  Sit to Stand    Sit to Stand  7: Independent                Objective measurements completed on examination: See above findings.      Treatment- See pt instructions for HEP that pt was educated in and completed all reps        PT Education - 06/26/18 1530    Education Details  role of PT; results of eval; PT POC; importance of doing HEP; may decr to <1x/week if progressing and benefitting (due to financial concerns)    Person(s) Educated  Patient    Methods  Explanation;Demonstration;Tactile cues;Verbal cues;Handout    Comprehension  Verbalized understanding;Returned demonstration;Verbal cues required;Tactile cues required;Need further instruction          PT Long Term Goals - 06/26/18 1554  PT LONG TERM GOAL #1   Title  Patient will increase limited cervical ROM by at least 10 degrees (where appropriate). Target all LTGs Neurological consultation 05/2018 states, "His neurological exam shows evidence of  a length dependent neuropathy.")    Time  4    Period  Weeks    Status  New    Target Date  07/26/18      PT LONG TERM GOAL #2   Title  Patient will tolerate moderate to deep manual therapy on rt cervical paraspinals and UT.    Time  4    Period  Weeks    Status  New      PT LONG TERM GOAL #3   Title  Patient will report decrease in baseline pain to <=3/10.     Time  4    Period  Weeks    Status  New      PT LONG TERM GOAL #4   Title  Patient will be independent with HEP with written instructions.    Time  4    Period  Weeks    Status  New             Plan - 06/26/18 1530    Clinical Impression Statement  Patient referred to OPPT due to numbness and tingling of right arm and leg. Of note, Brain MRI April 2019 negative; Neurological consultation 05/2018 states, "His neurological exam shows evidence of a length dependent neuropathy." Cervical MRI 06/2018 with foraminal narrowing C4-C6 without sign of cord compression. Patient very hypersensitive to touch on right side of body. Although his Spurlings test and UE neural tension tests were both positive, this in part could be due to his hypersensitivity or possibly anticipated pain. His cervical distraction test was negative with more pain with distraction and less pain when released. Discussed possible referral for dry needling at some point in the future and he does not want to consider anything with needles. Patient educated in gentle ROM stretches to begin his HEP. Patient may benefit from PT to address cervical ROM, tightness, and trying to open up space around cervical nerves, however this does not explain the pain, numbness and tingling below his arm. Will utilize the interventions listed below to address the deficits listed below.     History and Personal Factors relevant to plan of care:  PMH-chronic systolic heart failure due to NICM with EF 35-40%, uncontrolled DMII, GERD, HTN, MI 2015, memory issues, hyperlipidemia     Clinical Presentation  Evolving    Clinical Presentation due to:  no clear diagnosis    Clinical Decision Making  Moderate    Rehab Potential  Fair    Clinical Impairments Affecting Rehab Potential  Due to unclear cause of his symptoms    PT Frequency  1x / week    PT Duration  4 weeks    PT Treatment/Interventions  ADLs/Self Care Home Management;Aquatic Therapy;Biofeedback;Electrical Stimulation;Ultrasound;Moist Heat;Therapeutic exercise;Neuromuscular re-education;Cognitive remediation;Patient/family education;Manual techniques;Passive range of motion;Dry needling    PT Next Visit Plan  see if he has done limited HEP given on eval; continue to address cervical muscle spasms/tightness (heat, manual therapy) and add to HEP    PT Home Exercise Plan  [email or web address]    Consulted and Agree with Plan of Care  Patient       Patient will benefit from skilled therapeutic intervention in order to improve the following deficits and impairments:  Decreased activity tolerance, Decreased cognition, Decreased range of motion, Increased fascial restricitons,  Increased muscle spasms, Impaired flexibility, Impaired UE functional use, Impaired sensation, Postural dysfunction  Visit Diagnosis: Cervicalgia - Plan: PT plan of care cert/re-cert  Abnormal posture - Plan: PT plan of care cert/re-cert  Other disturbances of skin sensation - Plan: PT plan of care cert/re-cert  Other symptoms and signs involving the nervous system - Plan: PT plan of care cert/re-cert     Problem List Patient Active Problem List   Diagnosis Date Noted  . Nausea and vomiting 03/10/2018  . AF (paroxysmal atrial fibrillation) ([place]) 03/10/2018  . Non-cardiac chest pain 03/09/2018  . Diabetic neuropathy ([place]) 09/26/2017  . MCI (mild cognitive impairment) 06/23/2017  . OSA on CPAP 05/10/2017  . Gastroesophageal reflux disease 05/10/2017  . Insomnia 05/10/2017  . Onychomycosis of multiple toenails with type 2 diabetes mellitus and  peripheral neuropathy ([place]) 05/10/2017  . Chronic systolic heart failure ([place]) 11/22/2015  . Essential hypertension 11/22/2015  . DM (diabetes mellitus) ([place]) 11/20/2015  . HLD (hyperlipidemia) 11/20/2015  . Hx of adenomatous colonic polyps 05/19/2011    Rexanne Mano, PT 06/26/2018, 4:01 PM  [facility] [address] [place], [place], [address] Phone: [phone]   Fax:  [phone]  Name: Calvin [name] MRN: [id number] Date of Birth: [birth year]

## 2018-06-26 NOTE — Patient Instructions (Signed)
Access Code: [email or web address]  URL: [email or web address]  Date: 06/26/2018  Prepared by: Barry Brunner   Exercises  Supine Cervical Rotation AROM on Pillow - 3 reps - 1 sets - 30 seconds hold - 2-3x daily - 7x weekly  Supine Chin Tuck - 3 reps - 1 sets - 30 seconds hold - 2-3x daily - 7x weekly

## 2018-07-02 ENCOUNTER — Ambulatory Visit (HOSPITAL_COMMUNITY)
Admission: RE | Admit: 2018-07-02 | Discharge: 2018-07-02 | Disposition: A | Discharge status: Home / Self Care | Payer: Medicare HMO | Source: Ambulatory Visit | Attending: Cardiology | Admitting: Cardiology

## 2018-07-02 ENCOUNTER — Other Ambulatory Visit (HOSPITAL_COMMUNITY): Payer: Self-pay

## 2018-07-02 DIAGNOSIS — I5022 Chronic systolic (congestive) heart failure: Secondary | ICD-10-CM | POA: Diagnosis not present

## 2018-07-02 LAB — BASIC METABOLIC PANEL
Anion gap: 9 (ref 5–15)
BUN: 10 mg/dL (ref 6–20)
CO2: 28 mmol/L (ref 22–32)
Calcium: 9 mg/dL (ref 8.9–10.3)
Chloride: 102 mmol/L (ref 98–111)
Creatinine, Ser: 1.31 mg/dL — ABNORMAL HIGH (ref 0.61–1.24)
GFR calc Af Amer: >60 mL/min (ref >60)
GFR calc non Af Amer: 60 mL/min — ABNORMAL LOW (ref >60)
Glucose, Bld: 252 mg/dL — ABNORMAL HIGH (ref 70–99)
Potassium: 4.4 mmol/L (ref 3.5–5.1)
Sodium: 139 mmol/L (ref 135–145)

## 2018-07-02 NOTE — Progress Notes (Signed)
Paramedicine Encounter    Patient ID: Calvin [name], male    DOB: 1961-07-04, 57 y.o.   MRN: [id number]   [name], NP as PCP - General (Geriatric Medicine) [name], MD as PCP - Cardiology (Cardiology)  Patient Active Problem List   Diagnosis Date Noted  . Nausea and vomiting 03/10/2018  . AF (paroxysmal atrial fibrillation) ([place]) 03/10/2018  . Non-cardiac chest pain 03/09/2018  . Diabetic neuropathy ([place]) 09/26/2017  . MCI (mild cognitive impairment) 06/23/2017  . OSA on CPAP 05/10/2017  . Gastroesophageal reflux disease 05/10/2017  . Insomnia 05/10/2017  . Onychomycosis of multiple toenails with type 2 diabetes mellitus and peripheral neuropathy ([place]) 05/10/2017  . Chronic systolic heart failure ([place]) 11/22/2015  . Essential hypertension 11/22/2015  . DM (diabetes mellitus) ([place]) 11/20/2015  . HLD (hyperlipidemia) 11/20/2015  . Hx of adenomatous colonic polyps 05/19/2011    Current Outpatient Medications:  .  atorvastatin (LIPITOR) 40 MG tablet, , Disp: , Rfl: 6 .  Blood Glucose Monitoring Suppl (TRUE METRIX AIR GLUCOSE METER) w/Device KIT, 1 strip 3 (three) times daily by Does not apply route. Use 1 strip to check blood sugar three (3) times daily. Dx:E11.9, Disp: 1 kit, Rfl: 0 .  carvedilol (COREG) 3.125 MG tablet, Take 3.125 mg by mouth 2 (two) times daily with a meal., Disp: , Rfl:  .  donepezil (ARICEPT) 10 MG tablet, Take 1 tablet (10 mg total) by mouth at bedtime., Disp: 30 tablet, Rfl: 11 .  glucose blood (ONETOUCH VERIO) test strip, 1 each by Other route 2 (two) times daily. And lancets 2/day., Disp: 100 each, Rfl: 12 .  insulin NPH-regular Human (NOVOLIN 70/30) (70-30) 100 UNIT/ML injection, 70 units with breakfast, and 30 units with supper., Disp: 40 mL, Rfl: 3 .  Insulin Syringe-Needle U-100 (INSULIN SYRINGE .3CC/29GX1/2") 29G X 1/2" 0.3 ML MISC, Inject 1 Syringe 3 (three) times daily as directed. Check blood sugar  three times daily. Dx: E11.9, Disp: 100 each, Rfl: 3 .  lisinopril (PRINIVIL,ZESTRIL) 10 MG tablet, Take 1 tablet (10 mg total) by mouth at bedtime., Disp: 30 tablet, Rfl: 6 .  metFORMIN (GLUCOPHAGE) 500 MG tablet, TAKE 2 TABLETS(1000 MG) BY MOUTH TWICE DAILY WITH A MEAL, Disp: 120 tablet, Rfl: 2 .  apixaban (ELIQUIS) 5 MG TABS tablet, Take 1 tablet (5 mg total) by mouth 2 (two) times daily. (Patient not taking: Reported on 07/02/2018), Disp: 60 tablet, Rfl: 0 .  cetirizine (ZYRTEC) 10 MG tablet, Take 10 mg by mouth daily., Disp: , Rfl:  .  nitroGLYCERIN (NITROSTAT) 0.4 MG SL tablet, Place 1 tablet (0.4 mg total) under the tongue every 5 (five) minutes as needed for chest pain. (Patient not taking: Reported on 06/22/2018), Disp: 30 tablet, Rfl: 1 No Known Allergies    Social History   Socioeconomic History  . Marital status: Single    Spouse name: Not on file  . Number of children: 0  . Years of education: Not on file  . Highest education level: Not on file  Occupational History  . Occupation: Disability  Social Needs  . Financial resource strain: Not on file  . Food insecurity:    Worry: Not on file    Inability: Not on file  . Transportation needs:    Medical: Not on file    Non-medical: Not on file  Tobacco Use  . Smoking status: Never Smoker  . Smokeless tobacco: Never Used  Substance and Sexual Activity  . Alcohol use:  No  . Drug use: No  . Sexual activity: Yes    Birth control/protection: None  Lifestyle  . Physical activity:    Days per week: Not on file    Minutes per session: Not on file  . Stress: Not on file  Relationships  . Social connections:    Talks on phone: Not on file    Gets together: Not on file    Attends religious service: Not on file    Active member of club or organization: Not on file    Attends meetings of clubs or organizations: Not on file    Relationship status: Not on file  . Intimate partner violence:    Fear of current or ex partner:  Not on file    Emotionally abused: Not on file    Physically abused: Not on file    Forced sexual activity: Not on file  Other Topics Concern  . Not on file  Social History Narrative   Social History      Diet?       Do you drink/eat things with caffeine? yes      Marital status?       single                             What year were you married?      Do you live in a house, apartment, assisted living, condo, trailer, etc.? yes      Is it one or more stories? One story      How many persons live in your home?      Do you have any pets in your home? (please list) none      Highest level of education completed? graduate      Current or past profession:      Do you exercise?            no                          Type & how often?      Advanced Directives      Do you have a living will? no      Do you have a DNR form?                                  If not, do you want to discuss one? no      Do you have signed POA/HPOA for forms? no      Functional Status      Do you have difficulty bathing or dressing yourself? no      Do you have difficulty preparing food or eating? no      Do you have difficulty managing your medications? no      Do you have difficulty managing your finances? no      Do you have difficulty affording your medications? no    Physical Exam      Future Appointments  Date Time Provider [place]  07/09/2018 11:30 AM [facility] PHARMACIST [facility]  07/13/2018 10:15 AM Parcell, Rosey Bath, PT [facility]  07/18/2018  9:30 AM Mervyn Gay, PT [facility]  07/19/2018  9:30 AM Marzetta Board, DPM [facility]  07/20/2018 11:00 AM [facility] PA/NP [facility] None  07/26/2018  9:30 AM Mervyn Gay, PT [facility]  [facility]  08/01/2018  9:30 AM Mervyn Gay, PT [facility]  12/26/2018  8:30 AM Cameron Sprang, MD [facility] None    BP (!) 144/80   Pulse 84   Resp 15   SpO2 99%   CBG EMS-234  Last  visit weight-166 @ clinic  First visit with pt, pt just got back from his lab appointment this morning, he just taken his insulin, he did take his other meds this morning.  He lives with family at apt.  His god-daughter, her husband and child. He uses [employer]. He states his insulin is expensive but his pills are usually low co-pay.  He has disability income-$1200/humana insurance.   Pt reports this is a new diagnosis for him, he states he can get out and walk without getting too tired, he likes to stay active, he states if he sits around too much he gets tired. He used to work for [employer] for [employer].  He doesn't sleep much at night. He states he doesn't feel sleepy. When he does sleep he uses 2-3 pillows, he has not heard from a sleep study yet- he reports numerous attempts for this sleep study from other providers as well but never heard back about it. I advised him to give it another week or so and we can address it if he hasnt heard anything,  He goes to rehab for a pinched nerve in his neck. He sometimes gets tingling and numbness to his rt arm and he also has it to bottom of feet-explained that it could be from his neuropathy.  He uses public bus transportation or uses friends to take him to visits. I advised him to lets call [employer] and see what his benefits is for transportation.  24 trips for transportation for $0 co-pay--[employer] transportation- [phone]  He has trouble remembering to take his meds---his carvedilol is dated 4/19, he does not have the eliquis, does not have zyrtec, needs insulin--humana advised it should cost him $13.61 each month.  The lisinopril 66m is being started tonight. He has been taking 552m  He does state that he sometimes feels like his heart is racing and beating out of rhythm and he feels light headed.   He denies any issues with swelling, scales I have did not work so I  Will have to get him another set of  scales. Explained the importance He has not been checking his CBG--  Called pharmacy and they advised the lisinopril 1076meady, they are going to file refill for zyrtec, and the novolin is $45 not the $13.61 like humana said it would cost.  He is seeing a pharmacist next Monday to assist with med cost--I asked him to let them know of this about his insulin or if they need to call me that is perfectly fine as well.  Spoke with LisLattie Hawout his eliquis-she will look into it and get back with me. ---she advised he was in [place] and she is awaiting to hear if he needs to restart it.  His pill box was filled with what he had--per Epic list he is missing the zyrtec and eliquis.  Pharmacy advised they only have aviva plus test strips-his test strips are agamatrix that is dated in 2016. His meter is one touch verio IQ-he has not strips for that machine. Will send note to his PCP to see if he can get another meter set since these are old and his are mis-matching sets.  Marylouise Stacks, [place] [facility] Paramedic  07/02/18

## 2018-07-03 ENCOUNTER — Telehealth: Payer: Self-pay

## 2018-07-03 NOTE — Telephone Encounter (Signed)
I called patient to inform him that we received correspondence from Marylouise Stacks with the paramedicine program about his diabetic concerns and needs. I advised patient that I will forward this information to his diabetic doctor, Dr.Ellison to further address. Patient agreed with action plan  Message forwarded to Dr.Ellison

## 2018-07-03 NOTE — Telephone Encounter (Signed)
-----   Message from [name], NP sent at 07/03/2018  7:06 AM EST ----- Regarding: RE: glucometer I will fwd this to my CMA to help with getting him a new glucometer ordered. What kind of assistance does he need with his insulin? He is also over due for follow up in our office  ----- Message ----- From: Marylouise Stacks, EMT Sent: 07/02/2018   4:53 PM EST To: [name], NP Subject: glucometer                                     Good evening,   Mr. Schreier was referred to the paramedicine program from the heart failure clinic and my first home visit was today.  I'm not sure how long he has had the glucometer that he showed me today, however he has some mis-matching supplies for it. He does not have matching test strips to the machine. His machine is one touch verio IQ and his strips are agamatrix that is dated 2016. I [facility], and they do not carry the test strips for his machine. Can you help with this?   Also, I called humana to verify his insulin co-pay cost and it is suppose to be $13.61 since it is a tier 3 drug and [facility] told us it would be $45.  Anyway you can assist with that as well?   Thanks,  Marylouise Stacks, [place] 07/02/18

## 2018-07-06 NOTE — Telephone Encounter (Signed)
Ov is overdue.  Please advise pt to make appt

## 2018-07-09 ENCOUNTER — Ambulatory Visit (INDEPENDENT_AMBULATORY_CARE_PROVIDER_SITE_OTHER): Payer: Medicare HMO | Admitting: Pharmacist

## 2018-07-09 DIAGNOSIS — E1140 Type 2 diabetes mellitus with diabetic neuropathy, unspecified: Secondary | ICD-10-CM | POA: Diagnosis not present

## 2018-07-09 DIAGNOSIS — Z794 Long term (current) use of insulin: Secondary | ICD-10-CM

## 2018-07-09 DIAGNOSIS — I10 Essential (primary) hypertension: Secondary | ICD-10-CM

## 2018-07-09 MED ORDER — EMPAGLIFLOZIN 25 MG PO TABS: 25.0000 mg | ORAL_TABLET | Freq: Every day | ORAL | 3 refills | Status: DC

## 2018-07-09 MED ORDER — APIXABAN 5 MG PO TABS: 5.0000 mg | ORAL_TABLET | Freq: Two times a day (BID) | ORAL | 1 refills | Status: DC

## 2018-07-09 MED ORDER — ATORVASTATIN CALCIUM 80 MG PO TABS: 80.0000 mg | ORAL_TABLET | Freq: Every day | ORAL | 3 refills | Status: DC

## 2018-07-09 NOTE — Patient Instructions (Addendum)
For your cholesterol:  Take your atorvastatin 48m once a day  We submitted paperwork to your insurance for Repatha shots every 2 weeks and will let you know when you can pick this up from the pharmacy  We sent in an Eliquis refill to your pharmacy as well  Start taking Jardiance (empagliflozin) 1 tablet once a day to help with your blood sugar, blood pressure, and heart  Call the Medicare social security office and apply for their [place]: #[phone] This will help with the cost of your medications

## 2018-07-09 NOTE — Progress Notes (Addendum)
Patient ID: Calvin [name]                 DOB: August 22, 1960                    MRN: [id number]     HPI: Calvin [name] is a 58 y.o. male patient of Dr Angelena Form referred to lipid clinic by Lyda Jester, PA. 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 Patient was seen in clinic in October and atorvastatin was increased to 80 mg daily due to poorly controlled LDL. Patient presents today for further HLD management.   Patients presents today in good spirits. Patient reports that due to his memory loss he sometimes forgets to take medications. Patient uses a pillbox and a nurse comes to his house weekly to fill it up. Patient tries to take his medications in the morning and some in the evening. Patient has been taking atorvastatin 40 mg instead of 80 mg.   In clinic, patient reports that he has not been taking apixaban because he ran out of refills. Refills have been [facility] to [facility]. Also discussed Repatha for HLD management and empagliflozin to help with his heart and T2DM management. Patient reports that he is comfortable using injectables. Submitted PA for Repatha.  Patient expresses financial restraints. Called [facility] to inquire about his co-pay on Eliquis ($135 for a 90d supply) and (empagliflozin $135 for a 90d supply). Patient currently has [facility] and is likely that he has not met the deductible for the year. He has never applied for Medicaid or [place] program before.  Current Medications: atorvastatin 80 mg daily - pt has been taking 62m daily Intolerances: None Risk Factors: CAD, family history of CAD, T2DM LDL goal: <70 mg/dL  Diet: Baked chicken. Enjoys snacking on cookies, chips. Eats mostly at home.  Exercise: Patient likes to walk but reports forgetting his location at times.  Family History: Mother and father with CAD, heart attack (age of 543and 660, HTN, HLD, DM, HTN. Sister  with heart attack at age of 559  Social History: Disabled for 6 years due to diabetes. Denies tobacco and cocaine use.  Labs: (06/25/18) TC 244, TG 72, HDL 53, LDL 177 (atorvastatin 40 mg daily)  Past Medical History:  Diagnosis Date  . Adenoidal hypertrophy   . Adenomatous colon polyps 2012  . Arthritis   . Diabetes mellitus   . GERD (gastroesophageal reflux disease)   . [place]  .Marland KitchenHx of adenomatous colonic polyps 08/18/2017  . Hyperlipidemia   . Hypertension   . Mild CAD    a. cath in 08/2017 showing mild nonobstructive CAD with scattered 20-30% stenosis.   . Nonischemic cardiomyopathy ([place]    a. EF 20-25% by echo in 09/2017 with cath showing mild CAD. b.  Last echo 12/2017 EF 35-40%, grade 2 DD.  .Marland KitchenObesity   . PAF (paroxysmal atrial fibrillation) ([place]   . Sleep apnea    cpap    Current Outpatient Medications on File Prior to Visit  Medication Sig Dispense Refill  . apixaban (ELIQUIS) 5 MG TABS tablet Take 1 tablet (5 mg total) by mouth 2 (two) times daily. (Patient not taking: Reported on 07/02/2018) 60 tablet 0  . atorvastatin (LIPITOR) 40 MG tablet   6  . Blood Glucose Monitoring Suppl (TRUE METRIX AIR GLUCOSE METER) w/Device KIT 1  strip 3 (three) times daily by Does not apply route. Use 1 strip to check blood sugar three (3) times daily. Dx:E11.9 1 kit 0  . carvedilol (COREG) 3.125 MG tablet Take 3.125 mg by mouth 2 (two) times daily with a meal.    . cetirizine (ZYRTEC) 10 MG tablet Take 10 mg by mouth daily.    [name] donepezil (ARICEPT) 10 MG tablet Take 1 tablet (10 mg total) by mouth at bedtime. 30 tablet 11  . glucose blood (ONETOUCH VERIO) test strip 1 each by Other route 2 (two) times daily. And lancets 2/day. 100 each 12  . insulin NPH-regular Human (NOVOLIN 70/30) (70-30) 100 UNIT/ML injection 70 units with breakfast, and 30 units with supper. 40 mL 3  . Insulin Syringe-Needle U-100 (INSULIN SYRINGE .3CC/29GX1/2") 29G X 1/2" 0.3 ML  MISC Inject 1 Syringe 3 (three) times daily as directed. Check blood sugar three times daily. Dx: E11.9 100 each 3  . lisinopril (PRINIVIL,ZESTRIL) 10 MG tablet Take 1 tablet (10 mg total) by mouth at bedtime. 30 tablet 6  . metFORMIN (GLUCOPHAGE) 500 MG tablet TAKE 2 TABLETS(1000 MG) BY MOUTH TWICE DAILY WITH A MEAL 120 tablet 2  . nitroGLYCERIN (NITROSTAT) 0.4 MG SL tablet Place 1 tablet (0.4 mg total) under the tongue every 5 (five) minutes as needed for chest pain. (Patient not taking: Reported on 06/22/2018) 30 tablet 1   No current facility-administered medications on file prior to visit.     No Known Allergies  Assessment/Plan:  1. Hyperlipidemia - Patient's LDL above goal  <70 mg/dL due to CAD, T2DM, and family history of CAD. Will increase atorvastatin to 12m daily. Repatha PA submitted and approved. Rx sent to pharmacy to determine copay.  2. Diabetes - DM poorly controlled with Hgb A1c 11.9 in September 2019. Will start Jardiance 26mdaily due to DM, BP, and cardiac benefit. Pt will continue on insulin and metformin. Advised pt to keep follow up with Dr ElLoanne Drillingomorrow as scheduled.  3. Medication access - Pt has [employer] Part D, however multiple medications are currently cost prohibitive since he has not met his deductible for 2020. Provided pt with 1 month of Eliquis samples. With financial constraints, encouraged the patient to apply [place] [facility] [place]. [place]ided [place] as filling out paper applications is more difficult for pt to complete.  Seen in clinic by JiAlmira BarPharmD Candidate.   E. Supple, PharmD, BCACP, [id number]. Ch[address] LaneGrInstitute[place] [address]hone: [phone]: [phone]/12/2018 12:51 PM

## 2018-07-09 NOTE — Telephone Encounter (Signed)
Called pt to schedule appt. Agreed to be seen 07/10/18 @ 3pm.

## 2018-07-10 ENCOUNTER — Telehealth: Payer: Self-pay | Admitting: *Deleted

## 2018-07-10 ENCOUNTER — Ambulatory Visit (INDEPENDENT_AMBULATORY_CARE_PROVIDER_SITE_OTHER): Payer: Medicare HMO | Admitting: Endocrinology

## 2018-07-10 ENCOUNTER — Encounter: Payer: Self-pay | Admitting: Endocrinology

## 2018-07-10 VITALS — BP 118/84 | HR 94 | Ht 67.0 in | Wt 165.8 lb

## 2018-07-10 DIAGNOSIS — Z794 Long term (current) use of insulin: Secondary | ICD-10-CM | POA: Diagnosis not present

## 2018-07-10 DIAGNOSIS — E1140 Type 2 diabetes mellitus with diabetic neuropathy, unspecified: Secondary | ICD-10-CM

## 2018-07-10 DIAGNOSIS — G4733 Obstructive sleep apnea (adult) (pediatric): Secondary | ICD-10-CM

## 2018-07-10 LAB — POCT GLYCOSYLATED HEMOGLOBIN (HGB A1C): Hemoglobin A1C: 10.1 % — AB (ref 4.0–5.6)

## 2018-07-10 MED ORDER — INSULIN NPH ISOPHANE & REGULAR (70-30) 100 UNIT/ML ~~LOC~~ SUSP: SUBCUTANEOUS | 3 refills | Status: DC

## 2018-07-10 MED ORDER — EVOLOCUMAB 140 MG/ML ~~LOC~~ SOAJ: 1.0000 "pen " | SUBCUTANEOUS | 11 refills | Status: DC

## 2018-07-10 NOTE — Telephone Encounter (Signed)
-----   Message from Sueanne Margarita, MD sent at 07/09/2018  9:34 PM EST ----- Regarding: RE: Sleep study referral Gae Bon can you address this?  Traci ----- Message ----- From: Georgiana Shore, NP Sent: 07/09/2018   2:31 PM EST To: Sueanne Margarita, MD Subject: Sleep study referral                           Hey Dr Radford Pax,  I was wondering if you could help me with one of our paramedicine patients. He was referred for a sleep study in September and hasn't heard back anything yet. It's still listed as an active request under appointments. Can you help me get him scheduled?  Thanks, Georgiana Shore, NP

## 2018-07-10 NOTE — Telephone Encounter (Signed)
Split night ordered today

## 2018-07-10 NOTE — Patient Instructions (Addendum)
check your blood sugar twice a day.  vary the time of day when you check, between before the 3 meals, and at bedtime.  also check if you have symptoms of your blood sugar being too high or too low.  please keep a record of the readings and bring it to your next appointment here (or you can bring the meter itself).  You can write it on any piece of paper.  please call us sooner if your blood sugar goes below 70, or if you have a lot of readings over 200. Please increase the insulin to 80 units with breakfast, and 40 units with supper.   Please come back for a follow-up appointment in 2 months.

## 2018-07-10 NOTE — Addendum Note (Signed)
Addended by: SUPPLE, MEGAN E on: 07/10/2018 07:20 AM   Modules accepted: Orders

## 2018-07-10 NOTE — Progress Notes (Signed)
Subjective:    Patient ID: Calvin [name], male    DOB: 1961/06/07, 58 y.o.   MRN: [id number]  HPI Pt returns for f/u of diabetes mellitus: DM type: Insulin-requiring type 2. Dx'ed: 4967 Complications: polyneuropathy, CAD, and DR Therapy: insulin since soon after dx.  DKA: never Severe hypoglycemia: never Pancreatitis: never Pancreatic imaging: normal on 2003 CT. Other: he takes human insulin, due to cost; due to noncompliance, he is not a candidate for multiple daily injections.   Interval history:no cbg record, but states cbg's vary from 140-200.  There is no trend throughout the day.  He says he never misses the insulin.   Past Medical History:  Diagnosis Date  . Adenoidal hypertrophy   . Adenomatous colon polyps 2012  . Arthritis   . Diabetes mellitus   . GERD (gastroesophageal reflux disease)   . Heart attack ([place])    While living in [place]  [name] Hx of adenomatous colonic polyps 08/18/2017  . Hyperlipidemia   . Hypertension   . Mild CAD    a. cath in 08/2017 showing mild nonobstructive CAD with scattered 20-30% stenosis.   . Nonischemic cardiomyopathy ([place])    a. EF 20-25% by echo in 09/2017 with cath showing mild CAD. b.  Last echo 12/2017 EF 35-40%, grade 2 DD.  [name] Obesity   . PAF (paroxysmal atrial fibrillation) ([place])   . Sleep apnea    cpap    Past Surgical History:  Procedure Laterality Date  . COLONOSCOPY  05/13/11   9 adenomas  . FOREARM SURGERY    . MUSCLE BIOPSY    . RIGHT/LEFT HEART CATH AND CORONARY ANGIOGRAPHY N/A 08/25/2017   Procedure: RIGHT/LEFT HEART CATH AND CORONARY ANGIOGRAPHY;  Surgeon: [name], MD;  Location: [facility];  Service: Cardiovascular;  Laterality: N/A;    Social History   Socioeconomic History  . Marital status: Single    Spouse name: Not on file  . Number of children: 0  . Years of education: Not on file  . Highest education level: Not on file  Occupational History  . Occupation: Disability  Social  Needs  . Financial resource strain: Not on file  . Food insecurity:    Worry: Not on file    Inability: Not on file  . Transportation needs:    Medical: Not on file    Non-medical: Not on file  Tobacco Use  . Smoking status: Never Smoker  . Smokeless tobacco: Never Used  Substance and Sexual Activity  . Alcohol use: No  . Drug use: No  . Sexual activity: Yes    Birth control/protection: None  Lifestyle  . Physical activity:    Days per week: Not on file    Minutes per session: Not on file  . Stress: Not on file  Relationships  . Social connections:    Talks on phone: Not on file    Gets together: Not on file    Attends religious service: Not on file    Active member of club or organization: Not on file    Attends meetings of clubs or organizations: Not on file    Relationship status: Not on file  . Intimate partner violence:    Fear of current or ex partner: Not on file    Emotionally abused: Not on file    Physically abused: Not on file    Forced sexual activity: Not on file  Other Topics Concern  . Not on file  Social History Narrative  Social History      Diet?       Do you drink/eat things with caffeine? yes      Marital status?       single                             What year were you married?      Do you live in a house, apartment, assisted living, condo, trailer, etc.? yes      Is it one or more stories? One story      How many persons live in your home?      Do you have any pets in your home? (please list) none      Highest level of education completed? graduate      Current or past profession:      Do you exercise?            no                          Type & how often?      Advanced Directives      Do you have a living will? no      Do you have a DNR form?                                  If not, do you want to discuss one? no      Do you have signed POA/HPOA for forms? no      Functional Status      Do you have difficulty bathing or  dressing yourself? no      Do you have difficulty preparing food or eating? no      Do you have difficulty managing your medications? no      Do you have difficulty managing your finances? no      Do you have difficulty affording your medications? no    Current Outpatient Medications on File Prior to Visit  Medication Sig Dispense Refill  . apixaban (ELIQUIS) 5 MG TABS tablet Take 1 tablet (5 mg total) by mouth 2 (two) times daily. (Patient not taking: Reported on 07/11/2018) 180 tablet 1  . atorvastatin (LIPITOR) 80 MG tablet Take 1 tablet (80 mg total) by mouth daily. 90 tablet 3  . Blood Glucose Monitoring Suppl (TRUE METRIX AIR GLUCOSE METER) w/Device KIT 1 strip 3 (three) times daily by Does not apply route. Use 1 strip to check blood sugar three (3) times daily. Dx:E11.9 1 kit 0  . carvedilol (COREG) 3.125 MG tablet Take 3.125 mg by mouth 2 (two) times daily with a meal.    . cetirizine (ZYRTEC) 10 MG tablet Take 10 mg by mouth daily.    [name] donepezil (ARICEPT) 10 MG tablet Take 1 tablet (10 mg total) by mouth at bedtime. 30 tablet 11  . empagliflozin (JARDIANCE) 25 MG TABS tablet Take 25 mg by mouth daily. (Patient not taking: Reported on 07/11/2018) 90 tablet 3  . Evolocumab (REPATHA SURECLICK) 378 MG/ML SOAJ Inject 1 pen into the skin every 14 (fourteen) days. (Patient not taking: Reported on 07/11/2018) 2 pen 11  . glucose blood (ONETOUCH VERIO) test strip 1 each by Other route 2 (two) times daily. And lancets 2/day. 100 each 12  . Insulin Syringe-Needle U-100 (INSULIN SYRINGE .3CC/29GX1/2") 29G X 1/2" 0.3  ML MISC Inject 1 Syringe 3 (three) times daily as directed. Check blood sugar three times daily. Dx: E11.9 100 each 3  . lisinopril (PRINIVIL,ZESTRIL) 10 MG tablet Take 1 tablet (10 mg total) by mouth at bedtime. 30 tablet 6  . metFORMIN (GLUCOPHAGE) 500 MG tablet TAKE 2 TABLETS(1000 MG) BY MOUTH TWICE DAILY WITH A MEAL 120 tablet 2  . nitroGLYCERIN (NITROSTAT) 0.4 MG SL tablet Place 1  tablet (0.4 mg total) under the tongue every 5 (five) minutes as needed for chest pain. (Patient not taking: Reported on 07/11/2018) 30 tablet 1   No current facility-administered medications on file prior to visit.     No Known Allergies  Family History  Problem Relation Age of Onset  . Heart disease Mother   . Heart attack Mother 73  . Hypertension Mother   . Hyperlipidemia Mother   . Diabetes Mother   . Heart disease Father   . Heart attack Father 40  . Hypertension Father   . Hyperlipidemia Father   . Diabetes Father   . Heart attack Sister 36  . Colon cancer Neg Hx   . Stomach cancer Neg Hx   . Esophageal cancer Neg Hx   . Rectal cancer Neg Hx     BP 118/84 (BP Location: Right Arm, Patient Position: Sitting, Cuff Size: Normal)   Pulse 94   Ht _0  (1.702 m)   Wt 165 lb 12.8 oz (75.2 kg)   SpO2 94%   BMI 25.97 kg/m    Review of Systems He denies hypoglycemia.      Objective:   Physical Exam VITAL SIGNS:  See vs page GENERAL: no distress Pulses: dorsalis pedis intact bilat.   MSK: no deformity of the feet CV: no leg edema Skin:  no ulcer on the feet.  normal color and temp on the feet. Neuro: sensation is intact to touch on the feet Ext: very long toenails.  Lab Results  Component Value Date   HGBA1C 10.1 (A) 07/10/2018       Assessment & Plan:  Insulin-requiring type 2 DM, with DR: worse CAD: in this setting, he should avoid hypoglycemia, so we'll increase insulin slowly.    Patient Instructions  check your blood sugar twice a day.  vary the time of day when you check, between before the 3 meals, and at bedtime.  also check if you have symptoms of your blood sugar being too high or too low.  please keep a record of the readings and bring it to your next appointment here (or you can bring the meter itself).  You can write it on any piece of paper.  please call us sooner if your blood sugar goes below 70, or if you have a lot of readings over 200. Please  increase the insulin to 80 units with breakfast, and 40 units with supper.   Please come back for a follow-up appointment in 2 months.

## 2018-07-11 ENCOUNTER — Telehealth: Payer: Self-pay | Admitting: *Deleted

## 2018-07-11 ENCOUNTER — Other Ambulatory Visit (HOSPITAL_COMMUNITY): Payer: Self-pay

## 2018-07-11 NOTE — Telephone Encounter (Signed)
-----   Message from Freada Bergeron, [place] sent at 07/10/2018  2:05 PM EST ----- Regarding: precert Split night

## 2018-07-11 NOTE — Progress Notes (Signed)
Paramedicine Encounter    Patient ID: Calvin [name], male    DOB: [birth year], 59 y.o.   MRN: [id number]   [name], NP as PCP - General (Geriatric Medicine) [name], MD as PCP - Cardiology (Cardiology)  Patient Active Problem List   Diagnosis Date Noted  . Nausea and vomiting 03/10/2018  . AF (paroxysmal atrial fibrillation) ([place]) 03/10/2018  . Non-cardiac chest pain 03/09/2018  . Diabetic neuropathy ([place]) 09/26/2017  . MCI (mild cognitive impairment) 06/23/2017  . OSA on CPAP 05/10/2017  . Gastroesophageal reflux disease 05/10/2017  . Insomnia 05/10/2017  . Onychomycosis of multiple toenails with type 2 diabetes mellitus and peripheral neuropathy ([place]) 05/10/2017  . Chronic systolic heart failure ([place]) 11/22/2015  . Essential hypertension 11/22/2015  . DM (diabetes mellitus) ([place]) 11/20/2015  . HLD (hyperlipidemia) 11/20/2015  . Hx of adenomatous colonic polyps 05/19/2011    Current Outpatient Medications:  .  atorvastatin (LIPITOR) 80 MG tablet, Take 1 tablet (80 mg total) by mouth daily., Disp: 90 tablet, Rfl: 3 .  Blood Glucose Monitoring Suppl (TRUE METRIX AIR GLUCOSE METER) w/Device KIT, 1 strip 3 (three) times daily by Does not apply route. Use 1 strip to check blood sugar three (3) times daily. Dx:E11.9, Disp: 1 kit, Rfl: 0 .  carvedilol (COREG) 3.125 MG tablet, Take 3.125 mg by mouth 2 (two) times daily with a meal., Disp: , Rfl:  .  donepezil (ARICEPT) 10 MG tablet, Take 1 tablet (10 mg total) by mouth at bedtime., Disp: 30 tablet, Rfl: 11 .  glucose blood (ONETOUCH VERIO) test strip, 1 each by Other route 2 (two) times daily. And lancets 2/day., Disp: 100 each, Rfl: 12 .  insulin NPH-regular Human (NOVOLIN 70/30) (70-30) 100 UNIT/ML injection, 80 units with breakfast, and 40 units with supper., Disp: 40 mL, Rfl: 3 .  Insulin Syringe-Needle U-100 (INSULIN SYRINGE .3CC/29GX1/2") 29G X 1/2" 0.3 ML MISC, Inject 1 Syringe 3  (three) times daily as directed. Check blood sugar three times daily. Dx: E11.9, Disp: 100 each, Rfl: 3 .  lisinopril (PRINIVIL,ZESTRIL) 10 MG tablet, Take 1 tablet (10 mg total) by mouth at bedtime., Disp: 30 tablet, Rfl: 6 .  metFORMIN (GLUCOPHAGE) 500 MG tablet, TAKE 2 TABLETS(1000 MG) BY MOUTH TWICE DAILY WITH A MEAL, Disp: 120 tablet, Rfl: 2 .  apixaban (ELIQUIS) 5 MG TABS tablet, Take 1 tablet (5 mg total) by mouth 2 (two) times daily. (Patient not taking: Reported on 07/11/2018), Disp: 180 tablet, Rfl: 1 .  cetirizine (ZYRTEC) 10 MG tablet, Take 10 mg by mouth daily., Disp: , Rfl:  .  empagliflozin (JARDIANCE) 25 MG TABS tablet, Take 25 mg by mouth daily. (Patient not taking: Reported on 07/11/2018), Disp: 90 tablet, Rfl: 3 .  Evolocumab (REPATHA SURECLICK) 989 MG/ML SOAJ, Inject 1 pen into the skin every 14 (fourteen) days. (Patient not taking: Reported on 07/11/2018), Disp: 2 pen, Rfl: 11 .  nitroGLYCERIN (NITROSTAT) 0.4 MG SL tablet, Place 1 tablet (0.4 mg total) under the tongue every 5 (five) minutes as needed for chest pain. (Patient not taking: Reported on 07/11/2018), Disp: 30 tablet, Rfl: 1 No Known Allergies    Social History   Socioeconomic History  . Marital status: Single    Spouse name: Not on file  . Number of children: 0  . Years of education: Not on file  . Highest education level: Not on file  Occupational History  . Occupation: Disability  Social Needs  . Financial resource strain:  Not on file  . Food insecurity:    Worry: Not on file    Inability: Not on file  . Transportation needs:    Medical: Not on file    Non-medical: Not on file  Tobacco Use  . Smoking status: Never Smoker  . Smokeless tobacco: Never Used  Substance and Sexual Activity  . Alcohol use: No  . Drug use: No  . Sexual activity: Yes    Birth control/protection: None  Lifestyle  . Physical activity:    Days per week: Not on file    Minutes per session: Not on file  . Stress: Not on file   Relationships  . Social connections:    Talks on phone: Not on file    Gets together: Not on file    Attends religious service: Not on file    Active member of club or organization: Not on file    Attends meetings of clubs or organizations: Not on file    Relationship status: Not on file  . Intimate partner violence:    Fear of current or ex partner: Not on file    Emotionally abused: Not on file    Physically abused: Not on file    Forced sexual activity: Not on file  Other Topics Concern  . Not on file  Social History Narrative   Social History      Diet?       Do you drink/eat things with caffeine? yes      Marital status?       single                             What year were you married?      Do you live in a house, apartment, assisted living, condo, trailer, etc.? yes      Is it one or more stories? One story      How many persons live in your home?      Do you have any pets in your home? (please list) none      Highest level of education completed? graduate      Current or past profession:      Do you exercise?            no                          Type & how often?      Advanced Directives      Do you have a living will? no      Do you have a DNR form?                                  If not, do you want to discuss one? no      Do you have signed POA/HPOA for forms? no      Functional Status      Do you have difficulty bathing or dressing yourself? no      Do you have difficulty preparing food or eating? no      Do you have difficulty managing your medications? no      Do you have difficulty managing your finances? no      Do you have difficulty affording your medications? no    Physical Exam      Future Appointments  Date Time Provider [place]  07/13/2018 10:15 AM Berniece Andreas, PT [facility]  07/18/2018  9:30 AM Mervyn Gay, PT [facility]  07/19/2018  9:30 AM Marzetta Board, DPM [facility]   07/19/2018  8:00 PM [facility] ROOM 2 [facility]  07/20/2018 11:00 AM [facility] PA/NP [facility] None  07/26/2018  9:30 AM Mervyn Gay, PT [facility]  08/01/2018  9:30 AM Mervyn Gay, PT [facility]  09/12/2018 10:00 AM [name], MD [facility] None  12/26/2018  8:30 AM Cameron Sprang, MD [facility] None    BP 130/72   Pulse 96   Resp 15   SpO2 99%  CBG EMS-300 Weight yesterday?? Last weight-165 @ clinic  Pt reports he is feeling up and down, he went to PCP yesterday for diabetes, and a pharmacist on Monday. His insulin was increased. And [facility] instead for lower pricing.  Spoke to [place], pharmacist about his eliquis--still awaiting to hear about if he needs to restart his eliquis or not.  He has not picked up his jardiance yet. Called the pharmacy and for 90 day supply it is $100 and even a 30 day supply is $45.  His CBG is elevated, he reports taking all meds--except jardiance.  He denies any sob, he does get dizzy when he bends over. Advised him to take his time. Pt denies c/p. No swelling noted.  Need to get more scales for him.   Marylouise Stacks, [place] [facility] Paramedic  07/11/18

## 2018-07-11 NOTE — Telephone Encounter (Signed)
PA submitted via web portal to [facility] for sleep study scheduled on 07/19/18.

## 2018-07-12 ENCOUNTER — Telehealth: Payer: Self-pay | Admitting: *Deleted

## 2018-07-12 NOTE — Telephone Encounter (Signed)
Staff message sent to [place] received. Ok to schedule sleep study. Auth # [id number]. Valid 07/19/18 to 08/18/18.

## 2018-07-12 NOTE — Telephone Encounter (Signed)
-----   Message from Freada Bergeron, [place] sent at 07/10/2018  2:05 PM EST ----- Regarding: precert Split night

## 2018-07-13 ENCOUNTER — Ambulatory Visit: Payer: Medicare HMO | Admitting: Rehabilitation

## 2018-07-16 ENCOUNTER — Encounter (HOSPITAL_COMMUNITY): Payer: Self-pay

## 2018-07-16 ENCOUNTER — Telehealth: Payer: Self-pay | Admitting: Endocrinology

## 2018-07-16 ENCOUNTER — Emergency Department (HOSPITAL_COMMUNITY): Payer: Medicare HMO

## 2018-07-16 ENCOUNTER — Emergency Department (HOSPITAL_COMMUNITY)
Admission: EM | Admit: 2018-07-16 | Discharge: 2018-07-17 | Disposition: A | Discharge status: Home / Self Care | Payer: Medicare HMO | Attending: Emergency Medicine | Admitting: Emergency Medicine

## 2018-07-16 DIAGNOSIS — E119 Type 2 diabetes mellitus without complications: Secondary | ICD-10-CM | POA: Diagnosis not present

## 2018-07-16 DIAGNOSIS — Z79899 Other long term (current) drug therapy: Secondary | ICD-10-CM | POA: Insufficient documentation

## 2018-07-16 DIAGNOSIS — R0602 Shortness of breath: Secondary | ICD-10-CM | POA: Diagnosis not present

## 2018-07-16 DIAGNOSIS — Z794 Long term (current) use of insulin: Secondary | ICD-10-CM | POA: Diagnosis not present

## 2018-07-16 DIAGNOSIS — I110 Hypertensive heart disease with heart failure: Secondary | ICD-10-CM | POA: Insufficient documentation

## 2018-07-16 DIAGNOSIS — M79604 Pain in right leg: Secondary | ICD-10-CM | POA: Diagnosis not present

## 2018-07-16 DIAGNOSIS — M791 Myalgia, unspecified site: Secondary | ICD-10-CM | POA: Diagnosis not present

## 2018-07-16 DIAGNOSIS — R079 Chest pain, unspecified: Secondary | ICD-10-CM | POA: Diagnosis not present

## 2018-07-16 DIAGNOSIS — I5022 Chronic systolic (congestive) heart failure: Secondary | ICD-10-CM | POA: Insufficient documentation

## 2018-07-16 DIAGNOSIS — R52 Pain, unspecified: Secondary | ICD-10-CM

## 2018-07-16 DIAGNOSIS — M79605 Pain in left leg: Secondary | ICD-10-CM | POA: Diagnosis not present

## 2018-07-16 LAB — BASIC METABOLIC PANEL
Anion gap: 11 (ref 5–15)
BUN: 11 mg/dL (ref 6–20)
CO2: 24 mmol/L (ref 22–32)
Calcium: 9.6 mg/dL (ref 8.9–10.3)
Chloride: 103 mmol/L (ref 98–111)
Creatinine, Ser: 1.45 mg/dL — ABNORMAL HIGH (ref 0.61–1.24)
GFR calc Af Amer: >60 mL/min (ref >60)
GFR calc non Af Amer: 53 mL/min — ABNORMAL LOW (ref >60)
Glucose, Bld: 219 mg/dL — ABNORMAL HIGH (ref 70–99)
Potassium: 3.8 mmol/L (ref 3.5–5.1)
Sodium: 138 mmol/L (ref 135–145)

## 2018-07-16 LAB — CBC
HCT: 45.6 % (ref 39.0–52.0)
Hemoglobin: 15.4 g/dL (ref 13.0–17.0)
MCH: 30.4 pg (ref 26.0–34.0)
MCHC: 33.8 g/dL (ref 30.0–36.0)
MCV: 89.9 fL (ref 80.0–100.0)
Platelets: 222 10*3/uL (ref 150–400)
RBC: 5.07 MIL/uL (ref 4.22–5.81)
RDW: 11.9 % (ref 11.5–15.5)
WBC: 6.7 10*3/uL (ref 4.0–10.5)
nRBC: 0 % (ref 0.0–0.2)

## 2018-07-16 LAB — I-STAT TROPONIN, ED: Troponin i, poc: 0.01 ng/mL (ref 0.00–0.08)

## 2018-07-16 NOTE — ED Notes (Signed)
PT stated arm and chest pain worsening. Repeat EKG ran. RN notified.

## 2018-07-16 NOTE — Telephone Encounter (Signed)
-----   Message from Marylouise Stacks, EMT sent at 07/11/2018  3:40 PM EST ----- Regarding: medication Hello,   I called [facility] pharmacy for Mr. Touhey to see if his Vania Rea was ready and for 90 day supply it was $100 and 30 day supply was $45, pt states he cannot afford that. He is able to get the insulin from [facility]. I'm not sure if [facility] would be any cheaper, it is not on the $4 list.  Any suggestions?  Thanks,  Marylouise Stacks, [place] 07/11/18

## 2018-07-16 NOTE — ED Triage Notes (Signed)
Pt reports chest pain since yesterday along with dizziness and a headache. Pt has a home health nurse that comes to his house and they recommended him come here. NAD noted in triage.

## 2018-07-16 NOTE — Telephone Encounter (Signed)
Thank you for your message.  He can stop the jardiance.  Our office will call him, to tell him that.

## 2018-07-16 NOTE — Telephone Encounter (Signed)
Patient is scheduled for lab study on 07/19/18. Patient understands her sleep study will be done at [facility] sleep lab. Patient understands she will receive a sleep packet in a week or so. Patient understands to call if she does not receive the sleep packet in a timely manner. Patient agrees with treatment and thanked me for call Left detailed message on voicemail with date and time of titration and informed patient to call back to confirm or reschedule.

## 2018-07-17 LAB — I-STAT TROPONIN, ED: Troponin i, poc: 0.02 ng/mL (ref 0.00–0.08)

## 2018-07-17 LAB — CK: Total CK: 511 U/L — ABNORMAL HIGH (ref 49–397)

## 2018-07-17 LAB — D-DIMER, QUANTITATIVE: D-Dimer, Quant: <0.27 ug/mL-FEU (ref 0.00–0.50)

## 2018-07-17 MED ORDER — OSELTAMIVIR PHOSPHATE 75 MG PO CAPS: 75.0000 mg | ORAL_CAPSULE | Freq: Two times a day (BID) | ORAL | 0 refills | Status: DC

## 2018-07-17 MED ORDER — ONDANSETRON HCL 4 MG/2ML IJ SOLN
4.0000 mg | Freq: Once | INTRAMUSCULAR | Status: AC
  Administered 2018-07-17: 4 mg via INTRAVENOUS
  Filled 2018-07-17: qty 2

## 2018-07-17 MED ORDER — MORPHINE SULFATE (PF) 4 MG/ML IV SOLN
4.0000 mg | Freq: Once | INTRAVENOUS | Status: AC
  Administered 2018-07-17: 4 mg via INTRAVENOUS
  Filled 2018-07-17: qty 1

## 2018-07-17 MED ORDER — CYCLOBENZAPRINE HCL 10 MG PO TABS: 10.0000 mg | ORAL_TABLET | Freq: Two times a day (BID) | ORAL | 0 refills | Status: DC | PRN

## 2018-07-17 NOTE — Telephone Encounter (Signed)
Pt informed

## 2018-07-17 NOTE — Telephone Encounter (Signed)
  Lauralee Evener, CMA  Freada Bergeron, [place] Auth received. Ok to schedule sleep study. Auth # [id number] Valid dates 07/19/18 to 08/18/18.     ----- Message -----  From: Freada Bergeron, CMA  Sent: 07/10/2018  2:05 PM EST  To: [facility] Sleep Studies  Subject: precert                      Split night

## 2018-07-17 NOTE — ED Provider Notes (Signed)
[facility] EMERGENCY DEPARTMENT Provider Note   CSN: [id number] Arrival date & time: 07/16/18  1913     History   Chief Complaint Chief Complaint  Patient presents with  . Chest Pain    HPI Calvin [name] is a 58 y.o. male.  Patient presents to the emergency department with a chief complaint of chest pain and leg pain.  He states that his symptoms started yesterday.  He states that the pain radiates from his chest down both legs.  He denies any abdominal pain, but states that he has had some indigestion.  States that he has not eaten anything today.  Denies any vomiting or diarrhea.  Denies any fevers or chills.  Denies any cough or shortness of breath.  He states that his symptoms are worsened with movement and with palpation.  He has not taken anything for the pain.  The history is provided by the patient. No language interpreter was used.    Past Medical History:  Diagnosis Date  . Adenoidal hypertrophy   . Adenomatous colon polyps 2012  . Arthritis   . Diabetes mellitus   . GERD (gastroesophageal reflux disease)   . Heart attack ([place])    While living in [place]  [name] Hx of adenomatous colonic polyps 08/18/2017  . Hyperlipidemia   . Hypertension   . Mild CAD    a. cath in 08/2017 showing mild nonobstructive CAD with scattered 20-30% stenosis.   . Nonischemic cardiomyopathy ([place])    a. EF 20-25% by echo in 09/2017 with cath showing mild CAD. b.  Last echo 12/2017 EF 35-40%, grade 2 DD.  [name] Obesity   . PAF (paroxysmal atrial fibrillation) ([place])   . Sleep apnea    cpap    Patient Active Problem List   Diagnosis Date Noted  . Nausea and vomiting 03/10/2018  . AF (paroxysmal atrial fibrillation) ([place]) 03/10/2018  . Non-cardiac chest pain 03/09/2018  . Diabetic neuropathy ([place]) 09/26/2017  . MCI (mild cognitive impairment) 06/23/2017  . OSA on CPAP 05/10/2017  . Gastroesophageal reflux disease 05/10/2017  . Insomnia 05/10/2017  . Onychomycosis of  multiple toenails with type 2 diabetes mellitus and peripheral neuropathy ([place]) 05/10/2017  . Chronic systolic heart failure ([place]) 11/22/2015  . Essential hypertension 11/22/2015  . DM (diabetes mellitus) ([place]) 11/20/2015  . HLD (hyperlipidemia) 11/20/2015  . Hx of adenomatous colonic polyps 05/19/2011    Past Surgical History:  Procedure Laterality Date  . COLONOSCOPY  05/13/11   9 adenomas  . FOREARM SURGERY    . MUSCLE BIOPSY    . RIGHT/LEFT HEART CATH AND CORONARY ANGIOGRAPHY N/A 08/25/2017   Procedure: RIGHT/LEFT HEART CATH AND CORONARY ANGIOGRAPHY;  Surgeon: [name], MD;  Location: [facility];  Service: Cardiovascular;  Laterality: N/A;        Home Medications    Prior to Admission medications   Medication Sig Start Date End Date Taking? Authorizing Provider  apixaban (ELIQUIS) 5 MG TABS tablet Take 1 tablet (5 mg total) by mouth 2 (two) times daily. 07/09/18  Yes [name], MD  atorvastatin (LIPITOR) 80 MG tablet Take 1 tablet (80 mg total) by mouth daily. 07/09/18 10/07/18 Yes [name], MD  Blood Glucose Monitoring Suppl (TRUE METRIX AIR GLUCOSE METER) w/Device KIT 1 strip 3 (three) times daily by Does not apply route. Use 1 strip to check blood sugar three (3) times daily. Dx:E11.9 05/10/17   [name], DO  carvedilol (COREG) 3.125 MG tablet  Take 3.125 mg by mouth 2 (two) times daily with a meal.    [provider]  cetirizine (ZYRTEC) 10 MG tablet Take 10 mg by mouth daily.    [provider]  donepezil (ARICEPT) 10 MG tablet Take 1 tablet (10 mg total) by mouth at bedtime. 10/27/17   [name], DO  empagliflozin (JARDIANCE) 25 MG TABS tablet Take 25 mg by mouth daily. 07/09/18   [name], MD  Evolocumab (REPATHA SURECLICK) 161 MG/ML SOAJ Inject 1 pen into the skin every 14 (fourteen) days. 07/10/18   [name], MD  glucose blood (ONETOUCH VERIO) test strip 1 each by Other route  2 (two) times daily. And lancets 2/day. 03/27/18   [name], MD  insulin NPH-regular Human (NOVOLIN 70/30) (70-30) 100 UNIT/ML injection 80 units with breakfast, and 40 units with supper. 07/10/18   [name], MD  Insulin Syringe-Needle U-100 (INSULIN SYRINGE .3CC/29GX1/2") 29G X 1/2" 0.3 ML MISC Inject 1 Syringe 3 (three) times daily as directed. Check blood sugar three times daily. Dx: E11.9 05/10/17   [name], DO  lisinopril (PRINIVIL,ZESTRIL) 10 MG tablet Take 1 tablet (10 mg total) by mouth at bedtime. 06/22/18   [name], [name], MD  metFORMIN (GLUCOPHAGE) 500 MG tablet TAKE 2 TABLETS(1000 MG) BY MOUTH TWICE DAILY WITH A MEAL 04/23/18   [name], DO  nitroGLYCERIN (NITROSTAT) 0.4 MG SL tablet Place 1 tablet (0.4 mg total) under the tongue every 5 (five) minutes as needed for chest pain. 10/27/17   [name], DO    Family History Family History  Problem Relation Age of Onset  . Heart disease Mother   . Heart attack Mother 45  . Hypertension Mother   . Hyperlipidemia Mother   . Diabetes Mother   . Heart disease Father   . Heart attack Father 60  . Hypertension Father   . Hyperlipidemia Father   . Diabetes Father   . Heart attack Sister 42  . Colon cancer Neg Hx   . Stomach cancer Neg Hx   . Esophageal cancer Neg Hx   . Rectal cancer Neg Hx     Social History Social History   Tobacco Use  . Smoking status: Never Smoker  . Smokeless tobacco: Never Used  Substance Use Topics  . Alcohol use: No  . Drug use: No     Allergies   Patient has no known allergies.   Review of Systems Review of Systems  All other systems reviewed and are negative.    Physical Exam Updated Vital Signs BP (!) 143/78   Pulse 75   Temp 98.2 F (36.8 C) (Oral)   Resp 15   SpO2 98%   Physical Exam Vitals signs and nursing note reviewed.  Constitutional:      Appearance: He is well-developed.  HENT:     Head: Normocephalic and atraumatic.  Eyes:     General:  No scleral icterus.       Right eye: No discharge.        Left eye: No discharge.     Conjunctiva/sclera: Conjunctivae normal.     Pupils: Pupils are equal, round, and reactive to light.  Neck:     Musculoskeletal: Normal range of motion and neck supple.     Vascular: No JVD.  Cardiovascular:     Rate and Rhythm: Normal rate and regular rhythm.     Heart sounds: Normal heart sounds. No murmur. No friction rub. No gallop.   Pulmonary:  Effort: Pulmonary effort is normal. No respiratory distress.     Breath sounds: Normal breath sounds. No wheezing or rales.  Chest:     Chest wall: No tenderness.  Abdominal:     General: There is no distension.     Palpations: Abdomen is soft. There is no mass.     Tenderness: There is no abdominal tenderness. There is no guarding or rebound.  Musculoskeletal: Normal range of motion.        General: No tenderness.  Skin:    General: Skin is warm and dry.  Neurological:     Mental Status: He is alert and oriented to person, place, and time.  Psychiatric:        Behavior: Behavior normal.        Thought Content: Thought content normal.        Judgment: Judgment normal.      ED Treatments / Results  Labs (all labs ordered are listed, but only abnormal results are displayed) Labs Reviewed  BASIC METABOLIC PANEL - Abnormal; Notable for the following components:      Result Value   Glucose, Bld 219 (*)    Creatinine, Ser 1.45 (*)    GFR calc non Af Amer 53 (*)    All other components within normal limits  CBC  D-DIMER, QUANTITATIVE (NOT AT [facility])  I-STAT TROPONIN, ED  I-STAT TROPONIN, ED    EKG None  Radiology Dg Chest 2 View  Result Date: 07/16/2018 CLINICAL DATA:  Chest pain radiating down left side of chest since yesterday. Dizziness and shortness of breath. EXAM: CHEST - 2 VIEW COMPARISON:  March 09, 2018 FINDINGS: The heart size and mediastinal contours are within normal limits. Both lungs are clear. The visualized skeletal  structures are unremarkable. IMPRESSION: No active cardiopulmonary disease. Electronically Signed   By: Dorise Bullion III M.D   On: 07/16/2018 20:27    Procedures Procedures (including critical care time)  Medications Ordered in ED Medications  morphine 4 MG/ML injection 4 mg (has no administration in time range)  ondansetron (ZOFRAN) injection 4 mg (has no administration in time range)     Initial Impression / Assessment and Plan / ED Course  I have reviewed the triage vital signs and the nursing notes.  Pertinent labs & imaging results that were available during my care of the patient were reviewed by me and considered in my medical decision making (see chart for details).     Patient with chest pain and leg pain that started yesterday.  Symptoms are easily reproducible with palpation.  There is no rash or sign of infection.  His symptoms are also worsened with movement.  Denies any trauma or injury.  Laboratory work-up is reassuring.  Troponin is negative.  Chest x-ray is clear.  Remaining labs are at or near patient's baseline.  EKG shows no acute ischemic changes.  I do not believe the patient's symptoms to be ACS.  I doubt PE or DVT, but given that he does have leg pain and chest pain, will check a d-dimer.  He is not hypoxic or tachycardic.  Again, his symptoms are easily reproducible with movement and palpation, but no trauma.  Question early influenza, but no fever or cough.  If repeat troponin and d-dimer are negative, I believe that the patient can be safely discharged home and have outpatient follow-up.  Repeat troponin is negative, d-dimer negative.  CK mildly elevated, but not enough to be suspicious of rhabdo.    Patient  now having cough.  Suspect early flu.  Final Clinical Impressions(s) / ED Diagnoses   Final diagnoses:  Body aches    ED Discharge Orders         Ordered    cyclobenzaprine (FLEXERIL) 10 MG tablet  2 times daily PRN,   Status:  Discontinued      07/17/18 0210    oseltamivir (TAMIFLU) 75 MG capsule  Every 12 hours     07/17/18 0215           Montine Circle, [name] 07/17/18 0227    Ward, Delice Bison, DO 07/17/18 0122

## 2018-07-18 ENCOUNTER — Ambulatory Visit: Payer: Medicare HMO | Admitting: Rehabilitative and Restorative Service Providers"

## 2018-07-18 ENCOUNTER — Other Ambulatory Visit (HOSPITAL_COMMUNITY): Payer: Self-pay
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
## 2018-07-19 ENCOUNTER — Ambulatory Visit (HOSPITAL_BASED_OUTPATIENT_CLINIC_OR_DEPARTMENT_OTHER): Payer: Medicare HMO | Attending: Physician Assistant | Admitting: Cardiology

## 2018-07-19 ENCOUNTER — Ambulatory Visit (INDEPENDENT_AMBULATORY_CARE_PROVIDER_SITE_OTHER): Payer: Medicare HMO | Admitting: Podiatry

## 2018-07-19 ENCOUNTER — Encounter: Payer: Self-pay | Admitting: Podiatry

## 2018-07-19 DIAGNOSIS — R413 Other amnesia: Secondary | ICD-10-CM | POA: Diagnosis not present

## 2018-07-19 DIAGNOSIS — I10 Essential (primary) hypertension: Secondary | ICD-10-CM | POA: Diagnosis not present

## 2018-07-19 DIAGNOSIS — G4733 Obstructive sleep apnea (adult) (pediatric): Secondary | ICD-10-CM | POA: Diagnosis not present

## 2018-07-19 DIAGNOSIS — I2510 Atherosclerotic heart disease of native coronary artery without angina pectoris: Secondary | ICD-10-CM

## 2018-07-19 DIAGNOSIS — M79676 Pain in unspecified toe(s): Secondary | ICD-10-CM | POA: Diagnosis not present

## 2018-07-19 DIAGNOSIS — E1142 Type 2 diabetes mellitus with diabetic polyneuropathy: Secondary | ICD-10-CM

## 2018-07-19 DIAGNOSIS — B351 Tinea unguium: Secondary | ICD-10-CM

## 2018-07-19 DIAGNOSIS — I480 Paroxysmal atrial fibrillation: Secondary | ICD-10-CM | POA: Diagnosis not present

## 2018-07-19 DIAGNOSIS — R0789 Other chest pain: Secondary | ICD-10-CM

## 2018-07-19 DIAGNOSIS — L84 Corns and callosities: Secondary | ICD-10-CM

## 2018-07-19 DIAGNOSIS — I428 Other cardiomyopathies: Secondary | ICD-10-CM

## 2018-07-19 DIAGNOSIS — E785 Hyperlipidemia, unspecified: Secondary | ICD-10-CM | POA: Diagnosis not present

## 2018-07-19 NOTE — Patient Instructions (Addendum)
Diabetes Mellitus and Foot Care Foot care is an important part of your health, especially when you have diabetes. Diabetes may cause you to have problems because of poor blood flow (circulation) to your feet and legs, which can cause your skin to:  Become thinner and drier.  Break more easily.  Heal more slowly.  Peel and crack. You may also have nerve damage (neuropathy) in your legs and feet, causing decreased feeling in them. This means that you may not notice minor injuries to your feet that could lead to more serious problems. Noticing and addressing any potential problems early is the best way to prevent future foot problems. How to care for your feet Foot hygiene  Wash your feet daily with warm water and mild soap. Do not use hot water. Then, pat your feet and the areas between your toes until they are completely dry. Do not soak your feet as this can dry your skin.  Trim your toenails straight across. Do not dig under them or around the cuticle. File the edges of your nails with an emery board or nail file.  Apply a moisturizing lotion or petroleum jelly to the skin on your feet and to dry, brittle toenails. Use lotion that does not contain alcohol and is unscented. Do not apply lotion between your toes. Shoes and socks  Wear clean socks or stockings every day. Make sure they are not too tight. Do not wear knee-high stockings since they may decrease blood flow to your legs.  Wear shoes that fit properly and have enough cushioning. Always look in your shoes before you put them on to be sure there are no objects inside.  To break in new shoes, wear them for just a few hours a day. This prevents injuries on your feet. Wounds, scrapes, corns, and calluses  Check your feet daily for blisters, cuts, bruises, sores, and redness. If you cannot see the bottom of your feet, use a mirror or ask someone for help.  Do not cut corns or calluses or try to remove them with medicine.  If you  find a minor scrape, cut, or break in the skin on your feet, keep it and the skin around it clean and dry. You may clean these areas with mild soap and water. Do not clean the area with peroxide, alcohol, or iodine.  If you have a wound, scrape, corn, or callus on your foot, look at it several times a day to make sure it is healing and not infected. Check for: ? Redness, swelling, or pain. ? Fluid or blood. ? Warmth. ? Pus or a bad smell. General instructions  Do not cross your legs. This may decrease blood flow to your feet.  Do not use heating pads or hot water bottles on your feet. They may burn your skin. If you have lost feeling in your feet or legs, you may not know this is happening until it is too late.  Protect your feet from hot and cold by wearing shoes, such as at the beach or on hot pavement.  Schedule a complete foot exam at least once a year (annually) or more often if you have foot problems. If you have foot problems, report any cuts, sores, or bruises to your health care provider immediately. Contact a health care provider if:  You have a medical condition that increases your risk of infection and you have any cuts, sores, or bruises on your feet.  You have an injury that is not  healing.  You have redness on your legs or feet.  You feel burning or tingling in your legs or feet.  You have pain or cramps in your legs and feet.  Your legs or feet are numb.  Your feet always feel cold.  You have pain around a toenail. Get help right away if:  You have a wound, scrape, corn, or callus on your foot and: ? You have pain, swelling, or redness that gets worse. ? You have fluid or blood coming from the wound, scrape, corn, or callus. ? Your wound, scrape, corn, or callus feels warm to the touch. ? You have pus or a bad smell coming from the wound, scrape, corn, or callus. ? You have a fever. ? You have a red line going up your leg. Summary  Check your feet every day  for cuts, sores, red spots, swelling, and blisters.  Moisturize feet and legs daily.  Wear shoes that fit properly and have enough cushioning.  If you have foot problems, report any cuts, sores, or bruises to your health care provider immediately.  Schedule a complete foot exam at least once a year (annually) or more often if you have foot problems. This information is not intended to replace advice given to you by your health care provider. Make sure you discuss any questions you have with your health care provider. Document Released: 06/17/2000 Document Revised: 08/02/2017 Document Reviewed: 07/22/2016 Elsevier Interactive Patient Education  2019 [employer]  Diabetic Neuropathy Diabetic neuropathy refers to nerve damage that is caused by diabetes (diabetes mellitus). Over time, people with diabetes can develop nerve damage throughout the body. There are several types of diabetic neuropathy:  Peripheral neuropathy. This is the most common type of diabetic neuropathy. It causes damage to nerves that carry signals between the spinal cord and other parts of the body (peripheral nerves). This usually affects nerves in the feet and legs first, and may eventually affect the hands and arms. The damage affects the ability to sense touch or temperature.  Autonomic neuropathy. This type causes damage to nerves that control involuntary functions (autonomic nerves). These nerves carry signals that control: ? Heartbeat. ? Body temperature. ? Blood pressure. ? Urination. ? Digestion. ? Sweating. ? Sexual function. ? Response to changing blood sugar (glucose) levels.  Focal neuropathy. This type of nerve damage affects one area of the body, such as an arm, a leg, or the face. The injury may involve one nerve or a small group of nerves. Focal neuropathy can be painful and unpredictable, and occurs most often in older adults with diabetes. This often develops suddenly, but usually improves over time  and does not cause long-term problems.  Proximal neuropathy. This type of nerve damage affects the nerves of the thighs, hips, buttocks, or legs. It causes severe pain, weakness, and muscle death (atrophy), usually in the thigh muscles. It is more common among older men and people who have type 2 diabetes. The length of recovery time may vary. What are the causes? Peripheral, autonomic, and focal neuropathies are caused by diabetes that is not well controlled with treatment. The cause of proximal neuropathy is not known, but it may be caused by inflammation related to uncontrolled blood glucose levels. What are the signs or symptoms? Peripheral neuropathy Peripheral neuropathy develops slowly over time. When the nerves of the feet and legs no longer work, you may experience:  Burning, stabbing, or aching pain in the legs or feet.  Pain or cramping in the  legs or feet.  Loss of feeling (numbness) and inability to feel pressure or pain in the feet. This can lead to: ? Thick calluses or sores on areas of constant pressure. ? Ulcers. ? Reduced ability to feel temperature changes.  Foot deformities.  Muscle weakness.  Loss of balance or coordination. Autonomic neuropathy The symptoms of autonomic neuropathy vary depending on which nerves are affected. Symptoms may include:  Problems with digestion, such as: ? Nausea or vomiting. ? Poor appetite. ? Bloating. ? Diarrhea or constipation. ? Trouble swallowing. ? Losing weight without trying to.  Problems with the heart, blood and lungs, such as: ? Dizziness, especially when standing up. ? Fainting. ? Shortness of breath. ? Irregular heartbeat.  Bladder problems, such as: ? Trouble starting or stopping urination. ? Leaking urine. ? Trouble emptying the bladder. ? Urinary tract infections (UTIs).  Problems with other body functions, such as: ? Sweat. You may sweat too much or too little. ? Temperature. You might get hot easily.  Or, you might feel cold more than usual. ? Sexual function. Men may not be able to get or maintain an erection. Women may have vaginal dryness and difficulty with arousal. Focal neuropathy Symptoms affect only one area of the body. Common symptoms include:  Numbness.  Tingling.  Burning pain.  Prickling feeling.  Very sensitive skin.  Weakness.  Inability to move (paralysis).  Muscle twitching.  Muscles getting smaller (wasting).  Poor coordination.  Double or blurred vision. Proximal neuropathy  Sudden, severe pain in the hip, thigh, or buttocks. Pain may spread from the back into the legs (sciatica).  Pain and numbness in the arms and legs.  Tingling.  Loss of bladder control or bowel control.  Weakness and wasting of thigh muscles.  Difficulty getting up from a seated position.  Abdominal swelling.  Unexplained weight loss. How is this diagnosed? Diagnosis usually involves reviewing your medical history and any symptoms you have. Diagnosis varies depending on the type of neuropathy your health care provider suspects. Peripheral neuropathy Your health care provider will check areas that are affected by your nervous system (neurologic exam), such as your reflexes, how you move, and what you can feel. You may have other tests, such as:  Blood tests.  Removal and examination of fluid that surrounds the spinal cord (lumbar puncture).  CT scan.  MRI.  A test to check the nerves that control muscles (electromyogram, EMG).  Tests of how quickly messages pass through your nerves (nerve conduction velocity tests).  Removal of a small piece of nerve to be examined under a microscope (biopsy). Autonomic neuropathy You may have tests, such as:  Tests to measure your blood pressure and heart rate. This may include monitoring you while you are safely secured to an exam table that moves you from a lying position to an upright position (table tilt test).  Breathing  tests to check your lungs.  Tests to check how food moves through the digestive system (gastric emptying tests).  Blood, sweat, or urine tests.  Ultrasound of your bladder.  Spinal fluid tests. Focal neuropathy This condition may be diagnosed with:  A neurologic exam.  CT scan.  MRI.  EMG.  Nerve conduction velocity tests. Proximal neuropathy There is no test to diagnose this type of neuropathy. You may have tests to rule out other possible causes of this type of neuropathy. Tests may include:  X-rays of your spine and lumbar region.  Lumbar puncture.  MRI. How is this treated? The  goal of treatment is to keep nerve damage from getting worse. The most important part of treatment is keeping your blood glucose level and your A1C level within your target range by following your diabetes management plan. Over time, maintaining lower blood glucose levels helps lessen symptoms. In some cases, you may need prescription pain medicine. Follow these instructions at home:  Lifestyle   Do not use any products that contain nicotine or tobacco, such as cigarettes and e-cigarettes. If you need help quitting, ask your health care provider.  Be physically active every day. Include strength training and balance exercises.  Follow a healthy meal plan.  Work with your health care provider to manage your blood pressure. General instructions  Follow your diabetes management plan as directed. ? Check your blood glucose levels as directed by your health care provider. ? Keep your blood glucose in your target range as directed by your health care provider. ? Have your A1C level checked at least two times a year, or as often as told by your health care provider.  Take over the counter and prescription medicines only as told by your health care provider. This includes insulin and diabetes medicine.  Do not drive or use heavy machinery while taking prescription pain medicines.  Check your  skin and feet every day for cuts, bruises, redness, blisters, or sores.  Keep all follow up visits as told by your health care provider. This is important. Contact a health care provider if:  You have burning, stabbing, or aching pain in your legs or feet.  You are unable to feel pressure or pain in your feet.  You develop problems with digestion, such as: ? Nausea. ? Vomiting. ? Bloating. ? Constipation. ? Diarrhea. ? Abdominal pain.  You have difficulty with urination, such as inability: ? To control when you urinate (incontinence). ? To completely empty the bladder (retention).  You have palpitations.  You feel dizzy, weak, or faint when you stand up. Get help right away if:  You cannot urinate.  You have sudden weakness or loss of coordination.  You have trouble speaking.  You have pain or pressure in your chest.  You have an irregular heart beat.  You have sudden inability to move a part of your body. Summary  Diabetic neuropathy refers to nerve damage that is caused by diabetes. It can affect nerves throughout the entire body, causing numbness and pain in the arms, legs, digestive tract, heart, and other body systems.  Keep your blood glucose level and your blood pressure in your target range, as directed by your health care provider. This can help prevent neuropathy from getting worse.  Check your skin and feet every day for cuts, bruises, redness, blisters, or sores.  Do not use any products that contain nicotine or tobacco, such as cigarettes and e-cigarettes. If you need help quitting, ask your health care provider. This information is not intended to replace advice given to you by your health care provider. Make sure you discuss any questions you have with your health care provider. Document Released: 08/29/2001 Document Revised: 08/02/2017 Document Reviewed: 07/25/2016 Elsevier Interactive Patient Education  2019 [employer]  are small areas of thickened skin that occur on the top, sides, or tip of a toe. They contain a cone-shaped core with a point that can press on a nerve below. This causes pain.  Calluses are areas of thickened skin that can occur anywhere on the body, including the hands, fingers,  palms, soles of the feet, and heels. Calluses are usually larger than corns. What are the causes? Corns and calluses are caused by rubbing (friction) or pressure, such as from shoes that are too tight or do not fit properly. What increases the risk? Corns are more likely to develop in people who have misshapen toes (toe deformities), such as hammer toes. Calluses can occur with friction to any area of the skin. They are more likely to develop in people who:  Work with their hands.  Wear shoes that fit poorly, are too tight, or are high-heeled.  Have toe deformities. What are the signs or symptoms? Symptoms of a corn or callus include:  A hard growth on the skin.  Pain or tenderness under the skin.  Redness and swelling.  Increased discomfort while wearing tight-fitting shoes, if your feet are affected. If a corn or callus becomes infected, symptoms may include:  Redness and swelling that gets worse.  Pain.  Fluid, blood, or pus draining from the corn or callus. How is this diagnosed? Corns and calluses may be diagnosed based on your symptoms, your medical history, and a physical exam. How is this treated? Treatment for corns and calluses may include:  Removing the cause of the friction or pressure. This may involve: ? Changing your shoes. ? Wearing shoe inserts (orthotics) or other protective layers in your shoes, such as a corn pad. ? Wearing gloves.  Applying medicine to the skin (topical medicine) to help soften skin in the hardened, thickened areas.  Removing layers of dead skin with a file to reduce the size of the corn or callus.  Removing the corn or callus with a scalpel or  laser.  Taking antibiotic medicines, if your corn or callus is infected.  Having surgery, if a toe deformity is the cause. Follow these instructions at home:   Take over-the-counter and prescription medicines only as told by your health care provider.  If you were prescribed an antibiotic, take it as told by your health care provider. Do not stop taking it even if your condition starts to improve.  Wear shoes that fit well. Avoid wearing high-heeled shoes and shoes that are too tight or too loose.  Wear any padding, protective layers, gloves, or orthotics as told by your health care provider.  Soak your hands or feet and then use a file or pumice stone to soften your corn or callus. Do this as told by your health care provider.  Check your corn or callus every day for symptoms of infection. Contact a health care provider if you:  Notice that your symptoms do not improve with treatment.  Have redness or swelling that gets worse.  Notice that your corn or callus becomes painful.  Have fluid, blood, or pus coming from your corn or callus.  Have new symptoms. Summary  Corns are small areas of thickened skin that occur on the top, sides, or tip of a toe.  Calluses are areas of thickened skin that can occur anywhere on the body, including the hands, fingers, palms, and soles of the feet. Calluses are usually larger than corns.  Corns and calluses are caused by rubbing (friction) or pressure, such as from shoes that are too tight or do not fit properly.  Treatment may include wearing any padding, protective layers, gloves, or orthotics as told by your health care provider. This information is not intended to replace advice given to you by your health care provider. Make sure you discuss any  questions you have with your health care provider. Document Released: 03/26/2004 Document Revised: 05/03/2017 Document Reviewed: 05/03/2017 Elsevier Interactive Patient Education  2019 [employer]  A bunion is a bump on the base of the big toe that forms when the bones of the big toe joint move out of position. Bunions may be small at first, but they often get larger over time. They can make walking painful. What are the causes? A bunion may be caused by:  Wearing narrow or pointed shoes that force the big toe to press against the other toes.  Abnormal foot development that causes the foot to roll inward (pronate).  Changes in the foot that are caused by certain diseases, such as rheumatoid arthritis or polio.  A foot injury. What increases the risk? The following factors may make you more likely to develop this condition:  Wearing shoes that squeeze the toes together.  Having certain diseases, such as: ? Rheumatoid arthritis. ? Polio. ? Cerebral palsy.  Having family members who have bunions.  Being born with a foot deformity, such as flat feet or low arches.  Doing activities that put a lot of pressure on the feet, such as ballet dancing. What are the signs or symptoms? The main symptom of a bunion is a noticeable bump on the big toe. Other symptoms may include:  Pain.  Swelling around the big toe.  Redness and inflammation.  Thick or hardened skin on the big toe or between the toes.  Stiffness or loss of motion in the big toe.  Trouble with walking. How is this diagnosed? A bunion may be diagnosed based on your symptoms, medical history, and activities. You may have tests, such as:  X-rays. These allow your health care provider to check the position of the bones in your foot and look for damage to your joint. They also help your health care provider determine the severity of your bunion and the best way to treat it.  Joint aspiration. In this test, a sample of fluid is removed from the toe joint. This test may be done if you are in a lot of pain. It helps rule out diseases that cause painful swelling of the joints, such as arthritis. How is  this treated? Treatment depends on the severity of your symptoms. The goal of treatment is to relieve symptoms and prevent the bunion from getting worse. Your health care provider may recommend:  Wearing shoes that have a wide toe box.  Using bunion pads to cushion the affected area.  Taping your toes together to keep them in a normal position.  Placing a device inside your shoe (orthotics) to help reduce pressure on your toe joint.  Taking medicine to ease pain, inflammation, and swelling.  Applying heat or ice to the affected area.  Doing stretching exercises.  Surgery to remove scar tissue and move the toes back into their normal position. This treatment is rare. Follow these instructions at home: Managing pain, stiffness, and swelling   If directed, put ice on the painful area: ? Put ice in a plastic bag. ? Place a towel between your skin and the bag. ? Leave the ice on for 20 minutes, 2-3 times a day. Activity   If directed, apply heat to the affected area before you exercise. Use the heat source that your health care provider recommends, such as a moist heat pack or a heating pad. ? Place a towel between your skin and the heat source. ? Leave  the heat on for 20-30 minutes. ? Remove the heat if your skin turns bright red. This is especially important if you are unable to feel pain, heat, or cold. You may have a greater risk of getting burned.  Do exercises as told by your health care provider. General instructions  Support your toe joint with proper footwear, shoe padding, or taping as told by your health care provider.  Take over-the-counter and prescription medicines only as told by your health care provider.  Keep all follow-up visits as told by your health care provider. This is important. Contact a health care provider if your symptoms:  Get worse.  Do not improve in 2 weeks. Get help right away if you have:  Severe pain and trouble with  walking. Summary  A bunion is a bump on the base of the big toe that forms when the bones of the big toe joint move out of position.  Bunions can make walking painful.  Treatment depends on the severity of your symptoms.  Support your toe joint with proper footwear, shoe padding, or taping as told by your health care provider. This information is not intended to replace advice given to you by your health care provider. Make sure you discuss any questions you have with your health care provider. Document Released: 06/20/2005 Document Revised: 10/31/2017 Document Reviewed: 10/31/2017 Elsevier Interactive Patient Education  2019 [employer].

## 2018-07-19 NOTE — Progress Notes (Signed)
Subjective: Calvin [name] presents to clinic for preventative diabetic foot care. His last visit to our clinic was 05/22/2017. He voices pain in feet and describes it as numbness, tingling and burning.  He is inquiring about obtaining diabetic shoes on today. Patient states he sees Dr. Loanne Drilling for his diabetes.  He is on blood thinner, Eliquis.  [name], NP    Current Outpatient Medications:  .  apixaban (ELIQUIS) 5 MG TABS tablet, Take 1 tablet (5 mg total) by mouth 2 (two) times daily., Disp: 180 tablet, Rfl: 1 .  atorvastatin (LIPITOR) 80 MG tablet, Take 1 tablet (80 mg total) by mouth daily., Disp: 90 tablet, Rfl: 3 .  Blood Glucose Monitoring Suppl (TRUE METRIX AIR GLUCOSE METER) w/Device KIT, 1 strip 3 (three) times daily by Does not apply route. Use 1 strip to check blood sugar three (3) times daily. Dx:E11.9, Disp: 1 kit, Rfl: 0 .  carvedilol (COREG) 3.125 MG tablet, Take 3.125 mg by mouth 2 (two) times daily with a meal., Disp: , Rfl:  .  cetirizine (ZYRTEC) 10 MG tablet, Take 10 mg by mouth daily., Disp: , Rfl:  .  donepezil (ARICEPT) 10 MG tablet, Take 1 tablet (10 mg total) by mouth at bedtime., Disp: 30 tablet, Rfl: 11 .  empagliflozin (JARDIANCE) 25 MG TABS tablet, Take 25 mg by mouth daily., Disp: 90 tablet, Rfl: 3 .  Evolocumab (REPATHA SURECLICK) 672 MG/ML SOAJ, Inject 1 pen into the skin every 14 (fourteen) days., Disp: 2 pen, Rfl: 11 .  glucose blood (ONETOUCH VERIO) test strip, 1 each by Other route 2 (two) times daily. And lancets 2/day., Disp: 100 each, Rfl: 12 .  insulin NPH-regular Human (NOVOLIN 70/30) (70-30) 100 UNIT/ML injection, 80 units with breakfast, and 40 units with supper., Disp: 40 mL, Rfl: 3 .  Insulin Syringe-Needle U-100 (INSULIN SYRINGE .3CC/29GX1/2") 29G X 1/2" 0.3 ML MISC, Inject 1 Syringe 3 (three) times daily as directed. Check blood sugar three times daily. Dx: E11.9, Disp: 100 each, Rfl: 3 .  lisinopril (PRINIVIL,ZESTRIL) 10 MG tablet,  Take 1 tablet (10 mg total) by mouth at bedtime., Disp: 30 tablet, Rfl: 6 .  metFORMIN (GLUCOPHAGE) 500 MG tablet, TAKE 2 TABLETS(1000 MG) BY MOUTH TWICE DAILY WITH A MEAL, Disp: 120 tablet, Rfl: 2 .  nitroGLYCERIN (NITROSTAT) 0.4 MG SL tablet, Place 1 tablet (0.4 mg total) under the tongue every 5 (five) minutes as needed for chest pain., Disp: 30 tablet, Rfl: 1 .  oseltamivir (TAMIFLU) 75 MG capsule, Take 1 capsule (75 mg total) by mouth every 12 (twelve) hours., Disp: 10 capsule, Rfl: 0  No Known Allergies  Vascular Examination: Capillary refill time <3 seconds x 10 digits Dorsalis pedis 2/4 b/l  Posterior tibial pulses 2/4 b/l No digital hair x 10 digits Skin temperature WNL b/l  Dermatological Examination: Skin with normal turgor, texture and tone b/l  Toenails 1-5 b/l discolored, thick, dystrophic with subungual debris and pain with palpation to nailbeds due to thickness of nails.  Hyperkeratotic lesion medial hallux b/l  Musculoskeletal: Muscle strength 5/5 to all LE muscle groups  HAV with bunion b/l  Neurological: Sensation intact with 10 gram monofilament. Vibratory sensation diminished b/l  Assessment: 1. Painful onychomycosis toenails 1-5 b/l 2. Callus b/l hallux  3. NIDDM with subjective diabetic neuropathy symptoms  Plan: 1. Continue diabetic foot care principles. Literature dispensed. 2. Toenails 1-5 b/l were debrided in length and girth without iatrogenic bleeding. 3. Hyperkeratotic lesion pared with sterile chisel blade b/l  hallux without incident. 4. Discussed process for obtaining diabetic shoes with patient. He may discuss at his next visit Dr. Loanne Drilling and let me know. 5. Patient to report any pedal injuries to medical professional. 6. Follow up 3 months.  7. Patient/POA to call should there be a concern in the interim.

## 2018-07-20 ENCOUNTER — Encounter (HOSPITAL_COMMUNITY): Payer: Self-pay

## 2018-07-20 ENCOUNTER — Other Ambulatory Visit: Payer: Self-pay

## 2018-07-20 ENCOUNTER — Ambulatory Visit (HOSPITAL_COMMUNITY)
Admission: RE | Admit: 2018-07-20 | Discharge: 2018-07-20 | Disposition: A | Discharge status: Home / Self Care | Payer: Medicare HMO | Source: Ambulatory Visit | Attending: Internal Medicine | Admitting: Internal Medicine

## 2018-07-20 VITALS — BP 146/98 | HR 98 | Wt 159.1 lb

## 2018-07-20 DIAGNOSIS — I5042 Chronic combined systolic (congestive) and diastolic (congestive) heart failure: Secondary | ICD-10-CM | POA: Insufficient documentation

## 2018-07-20 DIAGNOSIS — Z7901 Long term (current) use of anticoagulants: Secondary | ICD-10-CM | POA: Diagnosis not present

## 2018-07-20 DIAGNOSIS — I428 Other cardiomyopathies: Secondary | ICD-10-CM | POA: Diagnosis not present

## 2018-07-20 DIAGNOSIS — Z833 Family history of diabetes mellitus: Secondary | ICD-10-CM | POA: Diagnosis not present

## 2018-07-20 DIAGNOSIS — G4730 Sleep apnea, unspecified: Secondary | ICD-10-CM | POA: Insufficient documentation

## 2018-07-20 DIAGNOSIS — E119 Type 2 diabetes mellitus without complications: Secondary | ICD-10-CM | POA: Insufficient documentation

## 2018-07-20 DIAGNOSIS — Z9119 Patient's noncompliance with other medical treatment and regimen: Secondary | ICD-10-CM | POA: Diagnosis not present

## 2018-07-20 DIAGNOSIS — I480 Paroxysmal atrial fibrillation: Secondary | ICD-10-CM | POA: Diagnosis not present

## 2018-07-20 DIAGNOSIS — Z79899 Other long term (current) drug therapy: Secondary | ICD-10-CM | POA: Diagnosis not present

## 2018-07-20 DIAGNOSIS — I10 Essential (primary) hypertension: Secondary | ICD-10-CM | POA: Diagnosis not present

## 2018-07-20 DIAGNOSIS — E785 Hyperlipidemia, unspecified: Secondary | ICD-10-CM | POA: Diagnosis not present

## 2018-07-20 DIAGNOSIS — Z8249 Family history of ischemic heart disease and other diseases of the circulatory system: Secondary | ICD-10-CM | POA: Insufficient documentation

## 2018-07-20 DIAGNOSIS — I2510 Atherosclerotic heart disease of native coronary artery without angina pectoris: Secondary | ICD-10-CM | POA: Diagnosis not present

## 2018-07-20 DIAGNOSIS — I252 Old myocardial infarction: Secondary | ICD-10-CM | POA: Diagnosis not present

## 2018-07-20 DIAGNOSIS — Z794 Long term (current) use of insulin: Secondary | ICD-10-CM | POA: Insufficient documentation

## 2018-07-20 DIAGNOSIS — I5022 Chronic systolic (congestive) heart failure: Secondary | ICD-10-CM | POA: Diagnosis not present

## 2018-07-20 DIAGNOSIS — K219 Gastro-esophageal reflux disease without esophagitis: Secondary | ICD-10-CM | POA: Insufficient documentation

## 2018-07-20 DIAGNOSIS — I110 Hypertensive heart disease with heart failure: Secondary | ICD-10-CM | POA: Diagnosis not present

## 2018-07-20 DIAGNOSIS — M1990 Unspecified osteoarthritis, unspecified site: Secondary | ICD-10-CM | POA: Diagnosis not present

## 2018-07-20 DIAGNOSIS — R413 Other amnesia: Secondary | ICD-10-CM

## 2018-07-20 NOTE — Patient Instructions (Signed)
Your physician recommends that you schedule a follow-up appointment in [place].  Your physician recommends that you schedule a follow-up appointment in [place].

## 2018-07-20 NOTE — Progress Notes (Signed)
[facility]  Referring Physician: Primary Care: Dr [name]  Primary Cardiologist: Dr Angelena Form  Neurology: Dr Delice Lesch   HPI: Calvin [name] is a 58 year old referred to the HF clinic by Dr [name] for HF evaluation.   Calvin [name] is 58 year old with a history of chronic systolic heart failure due to NICM with EF 35-40%, uncontrolled DMII, GERD, HTN, MI 2015, memory issues, and hyperlipidemia. Of note he was homeless for 6 years but was able to get housing in February of this year.   Admitted with 08/2017 with chest pain. 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  Readmitted in 10/2017 with chest pain. CTA was negative for PE. Marland KitchenHe was also admitted in 03/2018 of this year with chest pain. He had a brief episode of A fib and was started on eliquis and carvedilol was increased to 6.25 mg twice a day.  This was not thought to be ACS.   In the past he was on entresto but this was later stopped due to dizziness.  He was last seen by Ellen Henri [name] on 04/20/2018. From HF perspective he was stable. he was stable at that time. He was referred to HF clinic at that time. In November he was seen by Neurology for memory loss. He has been donepezil for several months. He was referred for sleep study.   Today he returns for HF follow up. Has ongoing memory issues. Last visit lisinopril was increased and he was referred to HF paramedicine. Had sleep study last night. Overall feeling fine. Denies SOB/PND/Orthopnea. Appetite ok. No fever or chills. Not weighing at home. Taking all medications. Lives with his goddaughter and uncle. Requires transportation assistance. Gets food stamps. Followed by Paramedicine.   ECHO 12/2017  EF 35-40% Grade II DD. Moderate global reduction in LV systolic function; moderate   diastolic dysfunction. ECHO 09/2017  EF 20-25% Grade IDD  LHC/RHC 08/2017  RA 5  PA 22/11 (15)  PCWP 9  CO 6 CI 3/14   Prox RCA to Mid RCA lesion is 30%  stenosed.  Mid RCA to Dist RCA lesion is 30% stenosed.  Prox Cx to Dist Cx lesion is 20% stenosed.  Mid LAD lesion is 30% stenosed.  There is mild to moderate left ventricular systolic dysfunction.  LV end diastolic pressure is mildly elevated.  The left ventricular ejection fraction is 35-45% by visual estimate.  There is no mitral valve regurgitation.    SH: Disabled for 6 years due to diabetes. He does not smoke. Denies cocaine abuse.  Past Medical History:  Diagnosis Date  . Adenoidal hypertrophy   . Adenomatous colon polyps 2012  . Arthritis   . Diabetes mellitus   . GERD (gastroesophageal reflux disease)   . Heart attack ([place])    While living in [place]  [name] Hx of adenomatous colonic polyps 08/18/2017  . Hyperlipidemia   . Hypertension   . Mild CAD    a. cath in 08/2017 showing mild nonobstructive CAD with scattered 20-30% stenosis.   . Nonischemic cardiomyopathy ([place])    a. EF 20-25% by echo in 09/2017 with cath showing mild CAD. b.  Last echo 12/2017 EF 35-40%, grade 2 DD.  [name] Obesity   . PAF (paroxysmal atrial fibrillation) ([place])   . Sleep apnea    cpap    Current Outpatient Medications  Medication Sig Dispense Refill  . apixaban (ELIQUIS) 5 MG TABS tablet Take 1 tablet (5 mg total) by  mouth 2 (two) times daily. 180 tablet 1  . atorvastatin (LIPITOR) 80 MG tablet Take 1 tablet (80 mg total) by mouth daily. 90 tablet 3  . Blood Glucose Monitoring Suppl (TRUE METRIX AIR GLUCOSE METER) w/Device KIT 1 strip 3 (three) times daily by Does not apply route. Use 1 strip to check blood sugar three (3) times daily. Dx:E11.9 1 kit 0  . carvedilol (COREG) 3.125 MG tablet Take 3.125 mg by mouth 2 (two) times daily with a meal.    . donepezil (ARICEPT) 10 MG tablet Take 1 tablet (10 mg total) by mouth at bedtime. 30 tablet 11  . empagliflozin (JARDIANCE) 25 MG TABS tablet Take 25 mg by mouth daily. 90 tablet 3  . glucose blood (ONETOUCH VERIO) test strip 1 each by Other route 2 (two)  times daily. And lancets 2/day. 100 each 12  . insulin NPH-regular Human (NOVOLIN 70/30) (70-30) 100 UNIT/ML injection 80 units with breakfast, and 40 units with supper. 40 mL 3  . Insulin Syringe-Needle U-100 (INSULIN SYRINGE .3CC/29GX1/2") 29G X 1/2" 0.3 ML MISC Inject 1 Syringe 3 (three) times daily as directed. Check blood sugar three times daily. Dx: E11.9 100 each 3  . lisinopril (PRINIVIL,ZESTRIL) 10 MG tablet Take 1 tablet (10 mg total) by mouth at bedtime. 30 tablet 6  . metFORMIN (GLUCOPHAGE) 500 MG tablet TAKE 2 TABLETS(1000 MG) BY MOUTH TWICE DAILY WITH A MEAL 120 tablet 2  . nitroGLYCERIN (NITROSTAT) 0.4 MG SL tablet Place 1 tablet (0.4 mg total) under the tongue every 5 (five) minutes as needed for chest pain. 30 tablet 1   No current facility-administered medications for this encounter.     No Known Allergies    Social History   Socioeconomic History  . Marital status: Single    Spouse name: Not on file  . Number of children: 0  . Years of education: Not on file  . Highest education level: Not on file  Occupational History  . Occupation: Disability  Social Needs  . Financial resource strain: Not on file  . Food insecurity:    Worry: Not on file    Inability: Not on file  . Transportation needs:    Medical: Not on file    Non-medical: Not on file  Tobacco Use  . Smoking status: Never Smoker  . Smokeless tobacco: Never Used  Substance and Sexual Activity  . Alcohol use: No  . Drug use: No  . Sexual activity: Yes    Birth control/protection: None  Lifestyle  . Physical activity:    Days per week: Not on file    Minutes per session: Not on file  . Stress: Not on file  Relationships  . Social connections:    Talks on phone: Not on file    Gets together: Not on file    Attends religious service: Not on file    Active member of club or organization: Not on file    Attends meetings of clubs or organizations: Not on file    Relationship status: Not on file  .  Intimate partner violence:    Fear of current or ex partner: Not on file    Emotionally abused: Not on file    Physically abused: Not on file    Forced sexual activity: Not on file  Other Topics Concern  . Not on file  Social History Narrative   Social History      Diet?       Do you drink/eat things  with caffeine? yes      Marital status?       single                             What year were you married?      Do you live in a house, apartment, assisted living, condo, trailer, etc.? yes      Is it one or more stories? One story      How many persons live in your home?      Do you have any pets in your home? (please list) none      Highest level of education completed? graduate      Current or past profession:      Do you exercise?            no                          Type & how often?      Advanced Directives      Do you have a living will? no      Do you have a DNR form?                                  If not, do you want to discuss one? no      Do you have signed POA/HPOA for forms? no      Functional Status      Do you have difficulty bathing or dressing yourself? no      Do you have difficulty preparing food or eating? no      Do you have difficulty managing your medications? no      Do you have difficulty managing your finances? no      Do you have difficulty affording your medications? no      Family History  Problem Relation Age of Onset  . Heart disease Mother   . Heart attack Mother 64  . Hypertension Mother   . Hyperlipidemia Mother   . Diabetes Mother   . Heart disease Father   . Heart attack Father 73  . Hypertension Father   . Hyperlipidemia Father   . Diabetes Father   . Heart attack Sister 10  . Colon cancer Neg Hx   . Stomach cancer Neg Hx   . Esophageal cancer Neg Hx   . Rectal cancer Neg Hx     Vitals:   07/20/18 1105  BP: (!) 146/98  Pulse: 98  SpO2: 100%  Weight: 72.2 kg (159 lb 1.6 oz)   Wt Readings from Last 3  Encounters:  07/20/18 72.2 kg (159 lb 1.6 oz)  07/19/18 74.4 kg (164 lb)  07/10/18 75.2 kg (165 lb 12.8 oz)     PHYSICAL EXAM: General:  Well appearing. No resp difficulty HEENT: normal Neck: supple. no JVD. Carotids 2+ bilat; no bruits. No lymphadenopathy or thryomegaly appreciated. Cor: PMI nondisplaced. Regular rate & rhythm. No rubs, gallops or murmurs. Lungs: clear Abdomen: soft, nontender, nondistended. No hepatosplenomegaly. No bruits or masses. Good bowel sounds. Extremities: no cyanosis, clubbing, rash, edema Neuro: alert & orientedx3, cranial nerves grossly intact. moves all 4 extremities w/o difficulty. Affect pleasant   ASSESSMENT & PLAN: 1. Chronic Systolic /Diastolic Heart Failure, NICM ECHO 12/2017 EF 35-40%. Has LHC in February of this year with  Nonobstructive CAD.  Plan to repeat ECHO after HF meds optimized.  NYHA II.  -Increase carvedilol 6.25 mg twice a day.   - Continue lisinopril to 10 mg at bed time.  He was intolerant entresto due to dizziness.  - Does not need diuretics.  - Would not add spiro at this time due to compliance issues.   2. HTN Elevated but did not have his  medications today.   3. Memory Loss On Aricept per Neurology  - He will need help with medications.   4. Uncotrolled DM hgb A1C >11.   5. Daytime fatigue.  Had sleep study last night.   Follow up in 4 week with pharmacy and 8 week with APP. Continue HF Paramedicine.  Discussed medications though  I am not sure he will recall the conversation.    Amy Clegg NP-C 11:25 AM

## 2018-07-22 NOTE — Procedures (Signed)
   Patient Name: Calvin [name], Calvin [name] Date: 07/19/2018 Gender: Male D.O.B: Sep 16, 1960 Age (years): 63 Referring Provider: Melina Copa Height (inches): 60 Interpreting Physician: Fransico Him MD, ABSM Weight (lbs): 164 RPSGT: Earney [place] BMI: 28 MRN: [id number] Neck Size: 15.00  CLINICAL INFORMATION Sleep Study Type: NPSG  Indication for sleep study: OSA, Snoring  Epworth Sleepiness Score: 3  SLEEP STUDY TECHNIQUE As per the AASM Manual for the Scoring of Sleep and Associated Events v2.3 (April 2016) with a hypopnea requiring 4% desaturations.  The channels recorded and monitored were frontal, central and occipital EEG, electrooculogram (EOG), submentalis EMG (chin), nasal and oral airflow, thoracic and abdominal wall motion, anterior tibialis EMG, snore microphone, electrocardiogram, and pulse oximetry.  MEDICATIONS Medications self-administered by patient taken the night of the study : N/A  SLEEP ARCHITECTURE The study was initiated at 9:25:14 PM and ended at 4:24:21 AM.  Sleep onset time was 7.3 minutes and the sleep efficiency was 71.5%. The total sleep time was 299.5 minutes.  Stage REM latency was 160.5 minutes.  The patient spent 3.0% of the night in stage N1 sleep, 74.3% in stage N2 sleep, 0.0% in stage N3 and 22.7% in REM.  Alpha intrusion was absent.  Supine sleep was 51.39%.  RESPIRATORY PARAMETERS The overall apnea/hypopnea index (AHI) was 1.4 per hour. There were 1 total apneas, including 1 obstructive, 0 central and 0 mixed apneas. There were 6 hypopneas and 7 RERAs.  The AHI during Stage REM sleep was 0.9 per hour.  AHI while supine was 2.3 per hour.  The mean oxygen saturation was 96.0%. The minimum SpO2 during sleep was 88.0%.  moderate snoring was noted during this study.  CARDIAC DATA The 2 lead EKG demonstrated sinus rhythm. The mean heart rate was 79.7 beats per minute. Other EKG findings include: None.  LEG MOVEMENT DATA The  total PLMS were 0 with a resulting PLMS index of 0.0. Associated arousal with leg movement index was 0.0 .  IMPRESSIONS - No significant obstructive sleep apnea occurred during this study (AHI = 1.4/h). - No significant central sleep apnea occurred during this study (CAI = 0.0/h). - The patient had minimal or no oxygen desaturation during the study (Min O2 = 88.0%) - The patient snored with moderate snoring volume. - No cardiac abnormalities were noted during this study. - Clinically significant periodic limb movements did not occur during sleep. No significant associated arousals.  DIAGNOSIS - Normal Study  RECOMMENDATIONS - Avoid alcohol, sedatives and other CNS depressants that may worsen sleep apnea and disrupt normal sleep architecture. - Sleep hygiene should be reviewed to assess factors that may improve sleep quality. - Weight management and regular exercise should be initiated or continued if appropriate.  [Electronically signed] 07/22/2018 10:40 PM  Fransico Him MD, ABSM Diplomate, American Board of Sleep Medicine

## 2018-07-23 ENCOUNTER — Other Ambulatory Visit: Payer: Self-pay | Admitting: *Deleted

## 2018-07-23 ENCOUNTER — Telehealth: Payer: Self-pay | Admitting: *Deleted

## 2018-07-23 DIAGNOSIS — E1140 Type 2 diabetes mellitus with diabetic neuropathy, unspecified: Secondary | ICD-10-CM

## 2018-07-23 DIAGNOSIS — Z794 Long term (current) use of insulin: Principal | ICD-10-CM

## 2018-07-23 MED ORDER — METFORMIN HCL 500 MG PO TABS: ORAL_TABLET | ORAL | 0 refills | Status: DC

## 2018-07-23 NOTE — Telephone Encounter (Signed)
Tried calling patient. LMOM to return call. Patient needs an appointment before anymore future refills

## 2018-07-23 NOTE — Telephone Encounter (Signed)
Informed patient of sleep study results and patient understanding was verbalized. Patient understands his sleep study showed no significant sleep apnea.   Pt is aware and agreeable to normal results. 

## 2018-07-23 NOTE — Telephone Encounter (Signed)
-----   Message from Sueanne Margarita, MD sent at 07/22/2018 10:42 PM EST ----- Please let patient know that sleep study showed no significant sleep apnea.

## 2018-07-23 NOTE — Progress Notes (Signed)
Please let pt know normal

## 2018-07-24 ENCOUNTER — Telehealth: Payer: Self-pay | Admitting: *Deleted

## 2018-07-24 NOTE — Telephone Encounter (Signed)
-----   Message from Charlie Pitter, [place] sent at 07/23/2018  6:02 AM EST -----   ----- Message ----- From: Sueanne Margarita, MD Sent: 07/22/2018  10:42 PM EST To: Charlie Pitter, [name]

## 2018-07-24 NOTE — Telephone Encounter (Signed)
Pt has made aware that his sleep study is normal. Pt verbalized understanding.

## 2018-07-25 ENCOUNTER — Other Ambulatory Visit (HOSPITAL_COMMUNITY): Payer: Self-pay

## 2018-07-25 NOTE — Progress Notes (Signed)
Paramedicine Encounter    Patient ID: Calvin [name], male    DOB: 1960-12-31, 58 y.o.   MRN: [id number]   [name], NP as PCP - General (Geriatric Medicine) [name], MD as PCP - Cardiology (Cardiology)  Patient Active Problem List   Diagnosis Date Noted  . Nausea and vomiting 03/10/2018  . AF (paroxysmal atrial fibrillation) ([place]) 03/10/2018  . Non-cardiac chest pain 03/09/2018  . Diabetic neuropathy ([place]) 09/26/2017  . MCI (mild cognitive impairment) 06/23/2017  . OSA on CPAP 05/10/2017  . Gastroesophageal reflux disease 05/10/2017  . Insomnia 05/10/2017  . Onychomycosis of multiple toenails with type 2 diabetes mellitus and peripheral neuropathy ([place]) 05/10/2017  . Chronic systolic heart failure ([place]) 11/22/2015  . Essential hypertension 11/22/2015  . DM (diabetes mellitus) ([place]) 11/20/2015  . HLD (hyperlipidemia) 11/20/2015  . Hx of adenomatous colonic polyps 05/19/2011    Current Outpatient Medications:  .  atorvastatin (LIPITOR) 80 MG tablet, Take 1 tablet (80 mg total) by mouth daily., Disp: 90 tablet, Rfl: 3 .  Blood Glucose Monitoring Suppl (TRUE METRIX AIR GLUCOSE METER) w/Device KIT, 1 strip 3 (three) times daily by Does not apply route. Use 1 strip to check blood sugar three (3) times daily. Dx:E11.9, Disp: 1 kit, Rfl: 0 .  carvedilol (COREG) 3.125 MG tablet, Take 6.25 mg by mouth 2 (two) times daily with a meal., Disp: , Rfl:  .  donepezil (ARICEPT) 10 MG tablet, Take 1 tablet (10 mg total) by mouth at bedtime., Disp: 30 tablet, Rfl: 11 .  glucose blood (ONETOUCH VERIO) test strip, 1 each by Other route 2 (two) times daily. And lancets 2/day., Disp: 100 each, Rfl: 12 .  insulin NPH-regular Human (NOVOLIN 70/30) (70-30) 100 UNIT/ML injection, 80 units with breakfast, and 40 units with supper., Disp: 40 mL, Rfl: 3 .  Insulin Syringe-Needle U-100 (INSULIN SYRINGE .3CC/29GX1/2") 29G X 1/2" 0.3 ML MISC, Inject 1 Syringe 3  (three) times daily as directed. Check blood sugar three times daily. Dx: E11.9, Disp: 100 each, Rfl: 3 .  lisinopril (PRINIVIL,ZESTRIL) 10 MG tablet, Take 1 tablet (10 mg total) by mouth at bedtime., Disp: 30 tablet, Rfl: 6 .  metFORMIN (GLUCOPHAGE) 500 MG tablet, Take two tablets by mouth twice daily with meal. Patient Needs appointment before anymore future refills., Disp: 120 tablet, Rfl: 0 .  apixaban (ELIQUIS) 5 MG TABS tablet, Take 1 tablet (5 mg total) by mouth 2 (two) times daily. (Patient not taking: Reported on 07/25/2018), Disp: 180 tablet, Rfl: 1 .  empagliflozin (JARDIANCE) 25 MG TABS tablet, Take 25 mg by mouth daily. (Patient not taking: Reported on 07/25/2018), Disp: 90 tablet, Rfl: 3 .  nitroGLYCERIN (NITROSTAT) 0.4 MG SL tablet, Place 1 tablet (0.4 mg total) under the tongue every 5 (five) minutes as needed for chest pain. (Patient not taking: Reported on 07/25/2018), Disp: 30 tablet, Rfl: 1 No Known Allergies    Social History   Socioeconomic History  . Marital status: Single    Spouse name: Not on file  . Number of children: 0  . Years of education: Not on file  . Highest education level: Not on file  Occupational History  . Occupation: Disability  Social Needs  . Financial resource strain: Not on file  . Food insecurity:    Worry: Not on file    Inability: Not on file  . Transportation needs:    Medical: Not on file    Non-medical: Not on file  Tobacco  Use  . Smoking status: Never Smoker  . Smokeless tobacco: Never Used  Substance and Sexual Activity  . Alcohol use: No  . Drug use: No  . Sexual activity: Yes    Birth control/protection: None  Lifestyle  . Physical activity:    Days per week: Not on file    Minutes per session: Not on file  . Stress: Not on file  Relationships  . Social connections:    Talks on phone: Not on file    Gets together: Not on file    Attends religious service: Not on file    Active member of club or organization: Not on file     Attends meetings of clubs or organizations: Not on file    Relationship status: Not on file  . Intimate partner violence:    Fear of current or ex partner: Not on file    Emotionally abused: Not on file    Physically abused: Not on file    Forced sexual activity: Not on file  Other Topics Concern  . Not on file  Social History Narrative   Social History      Diet?       Do you drink/eat things with caffeine? yes      Marital status?       single                             What year were you married?      Do you live in a house, apartment, assisted living, condo, trailer, etc.? yes      Is it one or more stories? One story      How many persons live in your home?      Do you have any pets in your home? (please list) none      Highest level of education completed? graduate      Current or past profession:      Do you exercise?            no                          Type & how often?      Advanced Directives      Do you have a living will? no      Do you have a DNR form?                                  If not, do you want to discuss one? no      Do you have signed POA/HPOA for forms? no      Functional Status      Do you have difficulty bathing or dressing yourself? no      Do you have difficulty preparing food or eating? no      Do you have difficulty managing your medications? no      Do you have difficulty managing your finances? no      Do you have difficulty affording your medications? no    Physical Exam      Future Appointments  Date Time Provider [place]  07/26/2018  9:30 AM Mervyn Gay, PT [facility]  08/01/2018  9:30 AM Mervyn Gay, PT [facility]  08/27/2018  2:00 PM [place] [facility] None  09/12/2018 10:00 AM Loanne Drilling,  Hilliard Clark, MD [facility] None  09/14/2018 10:00 AM [facility] PA/NP [facility] None  10/18/2018  9:15 AM Marzetta Board, DPM [facility]  12/26/2018  8:30 AM Cameron Sprang, MD  [facility] None    BP 118/68   Pulse 84   Resp 15   Wt 151 lb (68.5 kg)   SpO2 99%   BMI 25.92 kg/m   Weight yesterday-? Last visit weight-159 @ clinic  CBG's-230-240  Pt reports he is doing better since last week. His flu symptoms have resolved. He was able to pick up the tamiflu and that helped him feel better.  He denies any increased sob. He did do sleep study and they advised he does not have sleep apnea and to f/u with PCP regarding further issues of him feeling so tired. He gets tired but is not sleepy and gets no rest at night time.  Still waiting to hear back about his eliquis.  meds verified and pill box refilled.  Dr. Loanne Drilling advised he could stop the jardiance since the costs was too significant for pt to afford.  Poor appetite.  Pt states he will start weighing daily, he was given scales at last visit- I seen a note that he needs to sch appoint with his geriatric doc for metformin refills, however he is seeing dr Loanne Drilling for diabetic care too. Advised him he needed to f/u with one of the docs about his diabetes since his CBG's are still running elevated.  I am concerned about his weight due to poor appetite so hopefully with him weighing daily we can see how his weight is trending.   Marylouise Stacks, [place] [name] Paramedic  07/25/18

## 2018-07-26 ENCOUNTER — Ambulatory Visit: Payer: Medicare HMO | Admitting: Rehabilitative and Restorative Service Providers"

## 2018-07-30 ENCOUNTER — Other Ambulatory Visit: Payer: Self-pay | Admitting: *Deleted

## 2018-07-30 ENCOUNTER — Other Ambulatory Visit: Payer: Self-pay | Admitting: Nurse Practitioner

## 2018-07-30 DIAGNOSIS — Z794 Long term (current) use of insulin: Principal | ICD-10-CM

## 2018-07-30 DIAGNOSIS — E1140 Type 2 diabetes mellitus with diabetic neuropathy, unspecified: Secondary | ICD-10-CM

## 2018-07-30 MED ORDER — METFORMIN HCL 500 MG PO TABS: ORAL_TABLET | ORAL | 0 refills | Status: DC

## 2018-07-30 NOTE — Telephone Encounter (Signed)
Patient has an up coming appointment on 08/06/2018 Last OV 04/09/2018 Ok to refill?

## 2018-07-30 NOTE — Telephone Encounter (Signed)
[employer]ed forces technical officer

## 2018-08-01 ENCOUNTER — Ambulatory Visit: Payer: Medicare HMO | Admitting: Rehabilitative and Restorative Service Providers"

## 2018-08-01 ENCOUNTER — Other Ambulatory Visit (HOSPITAL_COMMUNITY): Payer: Self-pay

## 2018-08-01 NOTE — Progress Notes (Signed)
Paramedicine Encounter    Patient ID: Calvin [name], male    DOB: 02-18-1961, 58 y.o.   MRN: [id number]   [name], NP as PCP - General (Geriatric Medicine) [name], MD as PCP - Cardiology (Cardiology)  Patient Active Problem List   Diagnosis Date Noted  . Nausea and vomiting 03/10/2018  . AF (paroxysmal atrial fibrillation) ([place]) 03/10/2018  . Non-cardiac chest pain 03/09/2018  . Diabetic neuropathy ([place]) 09/26/2017  . MCI (mild cognitive impairment) 06/23/2017  . OSA on CPAP 05/10/2017  . Gastroesophageal reflux disease 05/10/2017  . Insomnia 05/10/2017  . Onychomycosis of multiple toenails with type 2 diabetes mellitus and peripheral neuropathy ([place]) 05/10/2017  . Chronic systolic heart failure ([place]) 11/22/2015  . Essential hypertension 11/22/2015  . DM (diabetes mellitus) ([place]) 11/20/2015  . HLD (hyperlipidemia) 11/20/2015  . Hx of adenomatous colonic polyps 05/19/2011    Current Outpatient Medications:  .  atorvastatin (LIPITOR) 80 MG tablet, Take 1 tablet (80 mg total) by mouth daily., Disp: 90 tablet, Rfl: 3 .  Blood Glucose Monitoring Suppl (TRUE METRIX AIR GLUCOSE METER) w/Device KIT, 1 strip 3 (three) times daily by Does not apply route. Use 1 strip to check blood sugar three (3) times daily. Dx:E11.9, Disp: 1 kit, Rfl: 0 .  carvedilol (COREG) 3.125 MG tablet, Take 6.25 mg by mouth 2 (two) times daily with a meal., Disp: , Rfl:  .  donepezil (ARICEPT) 10 MG tablet, Take 1 tablet (10 mg total) by mouth at bedtime., Disp: 30 tablet, Rfl: 11 .  glucose blood (ONETOUCH VERIO) test strip, 1 each by Other route 2 (two) times daily. And lancets 2/day., Disp: 100 each, Rfl: 12 .  insulin NPH-regular Human (NOVOLIN 70/30) (70-30) 100 UNIT/ML injection, 80 units with breakfast, and 40 units with supper., Disp: 40 mL, Rfl: 3 .  Insulin Syringe-Needle U-100 (INSULIN SYRINGE .3CC/29GX1/2") 29G X 1/2" 0.3 ML MISC, Inject 1 Syringe 3  (three) times daily as directed. Check blood sugar three times daily. Dx: E11.9, Disp: 100 each, Rfl: 3 .  lisinopril (PRINIVIL,ZESTRIL) 10 MG tablet, Take 1 tablet (10 mg total) by mouth at bedtime., Disp: 30 tablet, Rfl: 6 .  metFORMIN (GLUCOPHAGE) 500 MG tablet, TAKE 2 TABLETS BY MOUTH TWICE DAILY WITH MEAL, Disp: 40 tablet, Rfl: 0 .  apixaban (ELIQUIS) 5 MG TABS tablet, Take 1 tablet (5 mg total) by mouth 2 (two) times daily. (Patient not taking: Reported on 07/25/2018), Disp: 180 tablet, Rfl: 1 .  empagliflozin (JARDIANCE) 25 MG TABS tablet, Take 25 mg by mouth daily. (Patient not taking: Reported on 07/25/2018), Disp: 90 tablet, Rfl: 3 .  nitroGLYCERIN (NITROSTAT) 0.4 MG SL tablet, Place 1 tablet (0.4 mg total) under the tongue every 5 (five) minutes as needed for chest pain. (Patient not taking: Reported on 07/25/2018), Disp: 30 tablet, Rfl: 1 No Known Allergies    Social History   Socioeconomic History  . Marital status: Single    Spouse name: Not on file  . Number of children: 0  . Years of education: Not on file  . Highest education level: Not on file  Occupational History  . Occupation: Disability  Social Needs  . Financial resource strain: Not on file  . Food insecurity:    Worry: Not on file    Inability: Not on file  . Transportation needs:    Medical: Not on file    Non-medical: Not on file  Tobacco Use  . Smoking status: Never Smoker  .  Smokeless tobacco: Never Used  Substance and Sexual Activity  . Alcohol use: No  . Drug use: No  . Sexual activity: Yes    Birth control/protection: None  Lifestyle  . Physical activity:    Days per week: Not on file    Minutes per session: Not on file  . Stress: Not on file  Relationships  . Social connections:    Talks on phone: Not on file    Gets together: Not on file    Attends religious service: Not on file    Active member of club or organization: Not on file    Attends meetings of clubs or organizations: Not on file     Relationship status: Not on file  . Intimate partner violence:    Fear of current or ex partner: Not on file    Emotionally abused: Not on file    Physically abused: Not on file    Forced sexual activity: Not on file  Other Topics Concern  . Not on file  Social History Narrative   Social History      Diet?       Do you drink/eat things with caffeine? yes      Marital status?       single                             What year were you married?      Do you live in a house, apartment, assisted living, condo, trailer, etc.? yes      Is it one or more stories? One story      How many persons live in your home?      Do you have any pets in your home? (please list) none      Highest level of education completed? graduate      Current or past profession:      Do you exercise?            no                          Type & how often?      Advanced Directives      Do you have a living will? no      Do you have a DNR form?                                  If not, do you want to discuss one? no      Do you have signed POA/HPOA for forms? no      Functional Status      Do you have difficulty bathing or dressing yourself? no      Do you have difficulty preparing food or eating? no      Do you have difficulty managing your medications? no      Do you have difficulty managing your finances? no      Do you have difficulty affording your medications? no    Physical Exam      Future Appointments  Date Time Provider [place]  08/06/2018  9:30 AM [name], NP [facility] None  08/27/2018  2:00 PM [facility] PHARMACY [facility] None  09/12/2018 10:00 AM [name], MD [facility] None  09/14/2018 10:00 AM [facility] PA/NP [facility] None  10/18/2018  9:15 AM Marzetta Board,  DPM [facility]  12/26/2018  8:30 AM Cameron Sprang, MD [facility] None    Wt 147 lb (66.7 kg)   BMI 25.23 kg/m   Weight yesterday-147 Last visit weight-151 CBG's-300-320   Pt  reports he is feeling better, pt reports breathing is doing better. He took meds this morning. He reports keeping a h/a. He doesn't take anything for it, he lays down to help it go away. His b/p have been fine. He goes to geriatric care doc on Monday, told him to relay this to doc along with his sleeping issues.  No swelling noted.  meds verified and pill box refilled.   Marylouise Stacks, [facility] Paramedic  08/01/18

## 2018-08-06 ENCOUNTER — Ambulatory Visit (INDEPENDENT_AMBULATORY_CARE_PROVIDER_SITE_OTHER): Payer: Medicare HMO | Admitting: Nurse Practitioner

## 2018-08-06 ENCOUNTER — Encounter: Payer: Self-pay | Admitting: Nurse Practitioner

## 2018-08-06 VITALS — BP 120/74 | HR 87 | Temp 97.6°F | Ht 64.0 in | Wt 159.2 lb

## 2018-08-06 DIAGNOSIS — K219 Gastro-esophageal reflux disease without esophagitis: Secondary | ICD-10-CM | POA: Diagnosis not present

## 2018-08-06 DIAGNOSIS — I480 Paroxysmal atrial fibrillation: Secondary | ICD-10-CM

## 2018-08-06 DIAGNOSIS — Z794 Long term (current) use of insulin: Secondary | ICD-10-CM

## 2018-08-06 DIAGNOSIS — E1140 Type 2 diabetes mellitus with diabetic neuropathy, unspecified: Secondary | ICD-10-CM | POA: Diagnosis not present

## 2018-08-06 DIAGNOSIS — I10 Essential (primary) hypertension: Secondary | ICD-10-CM

## 2018-08-06 DIAGNOSIS — J0140 Acute pansinusitis, unspecified: Secondary | ICD-10-CM

## 2018-08-06 DIAGNOSIS — K5909 Other constipation: Secondary | ICD-10-CM | POA: Diagnosis not present

## 2018-08-06 DIAGNOSIS — E785 Hyperlipidemia, unspecified: Secondary | ICD-10-CM

## 2018-08-06 DIAGNOSIS — I5022 Chronic systolic (congestive) heart failure: Secondary | ICD-10-CM | POA: Diagnosis not present

## 2018-08-06 DIAGNOSIS — G3184 Mild cognitive impairment, so stated: Secondary | ICD-10-CM | POA: Diagnosis not present

## 2018-08-06 MED ORDER — APIXABAN 5 MG PO TABS: 5.0000 mg | ORAL_TABLET | Freq: Two times a day (BID) | ORAL | 1 refills | Status: DC

## 2018-08-06 MED ORDER — FLUTICASONE PROPIONATE 50 MCG/ACT NA SUSP: 1.0000 | Freq: Two times a day (BID) | NASAL | 1 refills | Status: DC | PRN

## 2018-08-06 MED ORDER — AMOXICILLIN-POT CLAVULANATE 875-125 MG PO TABS: 1.0000 | ORAL_TABLET | Freq: Two times a day (BID) | ORAL | 0 refills | Status: DC

## 2018-08-06 NOTE — Patient Instructions (Signed)
neti pot daily Plain nasal saline spray throughout the day as needed May use tylenol 325 mg 2 tablets every 6 hours as needed aches and pains or sore throat humidifier in the home to help with the dry air Mucinex DM by mouth twice daily as needed for cough and congestion with full glass of water  Keep well hydrated Avoid forcefully blowing nose To use flonase 1 spray into each nare twice daily x1 week then as needed    Sinusitis, Adult Sinusitis is inflammation of your sinuses. Sinuses are hollow spaces in the bones around your face. Your sinuses are located:  Around your eyes.  In the middle of your forehead.  Behind your nose.  In your cheekbones. Mucus normally drains out of your sinuses. When your nasal tissues become inflamed or swollen, mucus can become trapped or blocked. This allows bacteria, viruses, and fungi to grow, which leads to infection. Most infections of the sinuses are caused by a virus. Sinusitis can develop quickly. It can last for up to 4 weeks (acute) or for more than 12 weeks (chronic). Sinusitis often develops after a cold. What are the causes? This condition is caused by anything that creates swelling in the sinuses or stops mucus from draining. This includes:  Allergies.  Asthma.  Infection from bacteria or viruses.  Deformities or blockages in your nose or sinuses.  Abnormal growths in the nose (nasal polyps).  Pollutants, such as chemicals or irritants in the air.  Infection from fungi (rare). What increases the risk? You are more likely to develop this condition if you:  Have a weak body defense system (immune system).  Do a lot of swimming or diving.  Overuse nasal sprays.  Smoke. What are the signs or symptoms? The main symptoms of this condition are pain and a feeling of pressure around the affected sinuses. Other symptoms include:  Stuffy nose or congestion.  Thick drainage from your nose.  Swelling and warmth over the affected  sinuses.  Headache.  Upper toothache.  A cough that may get worse at night.  Extra mucus that collects in the throat or the back of the nose (postnasal drip).  Decreased sense of smell and taste.  Fatigue.  A fever.  Sore throat.  Bad breath. How is this diagnosed? This condition is diagnosed based on:  Your symptoms.  Your medical history.  A physical exam.  Tests to find out if your condition is acute or chronic. This may include: ? Checking your nose for nasal polyps. ? Viewing your sinuses using a device that has a light (endoscope). ? Testing for allergies or bacteria. ? Imaging tests, such as an MRI or CT scan. In rare cases, a bone biopsy may be done to rule out more serious types of fungal sinus disease. How is this treated? Treatment for sinusitis depends on the cause and whether your condition is chronic or acute.  If caused by a virus, your symptoms should go away on their own within 10 days. You may be given medicines to relieve symptoms. They include: ? Medicines that shrink swollen nasal passages (topical intranasal decongestants). ? Medicines that treat allergies (antihistamines). ? A spray that eases inflammation of the nostrils (topical intranasal corticosteroids). ? Rinses that help get rid of thick mucus in your nose (nasal saline washes).  If caused by bacteria, your health care provider may recommend waiting to see if your symptoms improve. Most bacterial infections will get better without antibiotic medicine. You may be given antibiotics  if you have: ? A severe infection. ? A weak immune system.  If caused by narrow nasal passages or nasal polyps, you may need to have surgery. Follow these instructions at home: Medicines  Take, use, or apply over-the-counter and prescription medicines only as told by your health care provider. These may include nasal sprays.  If you were prescribed an antibiotic medicine, take it as told by your health care  provider. Do not stop taking the antibiotic even if you start to feel better. Hydrate and humidify   Drink enough fluid to keep your urine pale yellow. Staying hydrated will help to thin your mucus.  Use a cool mist humidifier to keep the humidity level in your home above 50%.  Inhale steam for 10-15 minutes, 3-4 times a day, or as told by your health care provider. You can do this in the bathroom while a hot shower is running.  Limit your exposure to cool or dry air. Rest  Rest as much as possible.  Sleep with your head raised (elevated).  Make sure you get enough sleep each night. General instructions   Apply a warm, moist washcloth to your face 3-4 times a day or as told by your health care provider. This will help with discomfort.  Wash your hands often with soap and water to reduce your exposure to germs. If soap and water are not available, use hand sanitizer.  Do not smoke. Avoid being around people who are smoking (secondhand smoke).  Keep all follow-up visits as told by your health care provider. This is important. Contact a health care provider if:  You have a fever.  Your symptoms get worse.  Your symptoms do not improve within 10 days. Get help right away if:  You have a severe headache.  You have persistent vomiting.  You have severe pain or swelling around your face or eyes.  You have vision problems.  You develop confusion.  Your neck is stiff.  You have trouble breathing. Summary  Sinusitis is soreness and inflammation of your sinuses. Sinuses are hollow spaces in the bones around your face.  This condition is caused by nasal tissues that become inflamed or swollen. The swelling traps or blocks the flow of mucus. This allows bacteria, viruses, and fungi to grow, which leads to infection.  If you were prescribed an antibiotic medicine, take it as told by your health care provider. Do not stop taking the antibiotic even if you start to feel  better.  Keep all follow-up visits as told by your health care provider. This is important. This information is not intended to replace advice given to you by your health care provider. Make sure you discuss any questions you have with your health care provider. Document Released: 06/20/2005 Document Revised: 11/20/2017 Document Reviewed: 11/20/2017 Elsevier Interactive Patient Education  2019 [employer].

## 2018-08-06 NOTE — Progress Notes (Addendum)
Careteam: [name], NP as PCP - General (Geriatric Medicine) [name], MD as PCP - Cardiology (Cardiology)  Advanced Directive information Does Patient Have a Medical Advance Directive?: No, Would patient like information on creating a medical advance directive?: Yes (ED - Information included in AVS)  No Known Allergies  Chief Complaint  Patient presents with  . Medical Management of Chronic Issues    trouble sleeping, having headaches for several months,due for flu vaccine      HPI: Patient is a 58 y.o. male seen in the office today for routine follow up.   Previous pt of Dr [name] pt with hx of CHF/cardiomyopathy following with cardiology, GERD, DM, hyperlipidemia, OSA   DM- following with endocrinologist- has no idea what he is taking. States his "nurse is in charge of that"     Paroxysmal a fib- Reports he is taking eliquis 5 mg BID, coreg for rate control.  EF 30-40%   CHF/cardiomyopathy- hx of chest pain. Currently without chest pains but reports on and off chest pains. Following with cardiology routinely. Per records pt reporting intermittent chest pain in feb 2019 so definitive cath was arranged. This was done 08/25/17 showing mild nonobstructive CAD with LVEF 35-45% with normal filling pressures. F/u echo 09/2017 showed EF 20-25%. His med titration has been limited by orthostatic-type dizziness. He returned to the hospital 10/2017 with chest pain. UDS was negative. CTA showed no evidence of PE, abdominal aortic aneurysm or dissection, + stable left adrenal nodule. He also had some left sided weakness after hitting nose on a table and there was no evidence of reported neurologic abnormality. Brain imaging was nonacute. TSH 10/2017 wnl.  Last echo 12/2017 EF 35-40%, grade 2 DD. He was readmitted 03/2018 with recurrent chest pain and found to have PAF on telemetry. he was started on apixaban. Pt continues to complain of dizziness and  lightheaded currently taking coreg 3.125 mg BID, reports he feels the same when he takes medication and when he does not. There has been a lot of adjustments in medications due to tolerability and he is actually not sure what he is taking   Mild cognitive impairment- Pt reports he has trouble remembering.  States he has been having a "short memory" for as long as he has been coming here. ~1 year. Remains stable.  Has scheduled appt with neurologist for evaluation. MRI head done 10/14/17. 1. No acute intracranial process. 2. Minimal chronic small vessel ischemic changes   Hyperlipidemia- on lipitor 80 mg daily, has been referred to the lipid clinic for further management.    Review of Systems:  Review of Systems  Constitutional: Positive for malaise/fatigue. Negative for chills, fever and weight loss.  HENT: Positive for congestion. Negative for sore throat and tinnitus.   Respiratory: Negative for cough, sputum production and shortness of breath.   Cardiovascular: Positive for chest pain (off and on). Negative for palpitations and leg swelling.  Gastrointestinal: Negative for abdominal pain, constipation, diarrhea and heartburn.  Genitourinary: Negative for dysuria, frequency and urgency.  Musculoskeletal: Negative for back pain, falls, joint pain and myalgias.  Skin: Negative.   Neurological: Positive for headaches. Negative for dizziness.  Psychiatric/Behavioral: Positive for memory loss. Negative for depression. The patient does not have insomnia.    Past Medical History:  Diagnosis Date  . Adenoidal hypertrophy   . Adenomatous colon polyps 2012  . Arthritis   . Diabetes mellitus   . GERD (gastroesophageal reflux disease)   .  Heart attack (Home)    While living in [place]  [name] Hx of adenomatous colonic polyps 08/18/2017  . Hyperlipidemia   . Hypertension   . Mild CAD    a. cath in 08/2017 showing mild nonobstructive CAD with scattered 20-30% stenosis.   . Nonischemic cardiomyopathy  ([place])    a. EF 20-25% by echo in 09/2017 with cath showing mild CAD. b.  Last echo 12/2017 EF 35-40%, grade 2 DD.  [name] Obesity   . PAF (paroxysmal atrial fibrillation) ([place])   . Sleep apnea    cpap   Past Surgical History:  Procedure Laterality Date  . COLONOSCOPY  05/13/11   9 adenomas  . FOREARM SURGERY    . MUSCLE BIOPSY    . RIGHT/LEFT HEART CATH AND CORONARY ANGIOGRAPHY N/A 08/25/2017   Procedure: RIGHT/LEFT HEART CATH AND CORONARY ANGIOGRAPHY;  Surgeon: [name], MD;  Location: [facility];  Service: Cardiovascular;  Laterality: N/A;   Social History:   reports that he has never smoked. He has never used smokeless tobacco. He reports that he does not drink alcohol or use drugs.  Family History  Problem Relation Age of Onset  . Heart disease Mother   . Heart attack Mother 53  . Hypertension Mother   . Hyperlipidemia Mother   . Diabetes Mother   . Heart disease Father   . Heart attack Father 19  . Hypertension Father   . Hyperlipidemia Father   . Diabetes Father   . Heart attack Sister 55  . Colon cancer Neg Hx   . Stomach cancer Neg Hx   . Esophageal cancer Neg Hx   . Rectal cancer Neg Hx     Medications: Patient's Medications  New Prescriptions   No medications on file  Previous Medications   APIXABAN (ELIQUIS) 5 MG TABS TABLET    Take 1 tablet (5 mg total) by mouth 2 (two) times daily.   ATORVASTATIN (LIPITOR) 80 MG TABLET    Take 1 tablet (80 mg total) by mouth daily.   BLOOD GLUCOSE MONITORING SUPPL (TRUE METRIX AIR GLUCOSE METER) W/DEVICE KIT    1 strip 3 (three) times daily by Does not apply route. Use 1 strip to check blood sugar three (3) times daily. Dx:E11.9   CARVEDILOL (COREG) 3.125 MG TABLET    Take 6.25 mg by mouth 2 (two) times daily with a meal.   DONEPEZIL (ARICEPT) 10 MG TABLET    Take 1 tablet (10 mg total) by mouth at bedtime.   EMPAGLIFLOZIN (JARDIANCE) 25 MG TABS TABLET    Take 25 mg by mouth daily.   GLUCOSE BLOOD (ONETOUCH  VERIO) TEST STRIP    1 each by Other route 2 (two) times daily. And lancets 2/day.   INSULIN NPH-REGULAR HUMAN (NOVOLIN 70/30) (70-30) 100 UNIT/ML INJECTION    80 units with breakfast, and 40 units with supper.   INSULIN SYRINGE-NEEDLE U-100 (INSULIN SYRINGE .3CC/29GX1/2") 29G X 1/2" 0.3 ML MISC    Inject 1 Syringe 3 (three) times daily as directed. Check blood sugar three times daily. Dx: E11.9   LISINOPRIL (PRINIVIL,ZESTRIL) 10 MG TABLET    Take 1 tablet (10 mg total) by mouth at bedtime.   METFORMIN (GLUCOPHAGE) 500 MG TABLET    TAKE 2 TABLETS BY MOUTH TWICE DAILY WITH MEAL   NITROGLYCERIN (NITROSTAT) 0.4 MG SL TABLET    Place 1 tablet (0.4 mg total) under the tongue every 5 (five) minutes as needed for chest pain.  Modified Medications  No medications on file  Discontinued Medications   No medications on file     Physical Exam:  Vitals:   08/06/18 0930  BP: 120/74  Pulse: 87  Temp: 97.6 F (36.4 C)  TempSrc: Oral  SpO2: 99%  Weight: 159 lb 3.2 oz (72.2 kg)  Height: _0  (1.626 m)   Body mass index is 27.33 kg/m.  Physical Exam Constitutional:      Appearance: Normal appearance. He is well-developed.  HENT:     Right Ear: Tympanic membrane, ear canal and external ear normal.     Left Ear: Tympanic membrane, ear canal and external ear normal.     Nose: Congestion present.     Mouth/Throat:     Mouth: Mucous membranes are moist.  Eyes:     General: No scleral icterus.    Pupils: Pupils are equal, round, and reactive to light.  Neck:     Musculoskeletal: Neck supple.     Thyroid: No thyromegaly.     Vascular: No carotid bruit.  Cardiovascular:     Rate and Rhythm: Normal rate and regular rhythm.     Heart sounds: Murmur (1/6 sem) present. No friction rub. No gallop.   Pulmonary:     Effort: Pulmonary effort is normal.     Breath sounds: Normal breath sounds. No wheezing or rales.  Chest:     Chest wall: No tenderness.  Abdominal:     General: Bowel sounds are  normal. There is no distension or abdominal bruit.     Palpations: Abdomen is soft. Abdomen is not rigid. There is no hepatomegaly, mass or pulsatile mass.     Tenderness: There is no abdominal tenderness. There is no guarding or rebound.     Hernia: No hernia is present.  Musculoskeletal:     Right lower leg: No edema.  Lymphadenopathy:     Cervical: No cervical adenopathy.  Skin:    General: Skin is warm and dry.     Findings: No rash.  Neurological:     Mental Status: He is alert.  Psychiatric:        Behavior: Behavior normal.     Labs reviewed: Basic Metabolic Panel: Recent Labs    10/15/17 0754  03/09/18 1849  05/07/18 0929 07/02/18 0928 07/16/18 1928  NA 136   < >  --    < > 138 139 138  K 4.1   < >  --    < > 4.5 4.4 3.8  CL 105   < >  --    < > 99 102 103  CO2 22   < >  --    < > _1 GLUCOSE 257*   < >  --    < > 266* 252* 219*  BUN 6   < >  --    < > _2 CREATININE 1.03   < > 1.14   < > 1.34* 1.31* 1.45*  CALCIUM 8.5*   < >  --    < > 9.1 9.0 9.6  MG 1.9  --  1.9  --   --   --   --   PHOS 2.9  --  3.5  --   --   --   --   TSH 1.362  --   --   --   --   --   --    < > = values in this interval not displayed.   Liver  Function Tests: Recent Labs    11/10/17 0953 03/09/18 0949 06/15/18 1033  AST _0 ALT _1 ALKPHOS 76 72 82  BILITOT 0.4 1.0 0.3  PROT 6.2 6.4* 6.3  ALBUMIN 3.8 3.7 4.2   Recent Labs    03/09/18 0949  LIPASE 33   No results for input(s): AMMONIA in the last 8760 hours. CBC: Recent Labs    01/25/18 2245  03/09/18 1849 03/10/18 0345 07/16/18 1928  WBC 5.7   < > 7.8 7.5 6.7  NEUTROABS 3.5  --   --   --   --   HGB 15.4   < > 15.7 14.6 15.4  HCT 46.0   < > 47.3 42.9 45.6  MCV 90.9   < > 90.6 89.6 89.9  PLT 191   < > 229 212 222   < > = values in this interval not displayed.   Lipid Panel: Recent Labs    11/10/17 0953 03/09/18 1500 06/15/18 1033  CHOL 199 222* 244*  HDL 50 58 53  LDLCALC 135* 154*  177*  TRIG 70 52 72  CHOLHDL 4.0 3.8 4.6   TSH: Recent Labs    10/15/17 0754  TSH 1.362   A1C: Lab Results  Component Value Date   HGBA1C 10.1 (A) 07/10/2018     Assessment/Plan 1. Type 2 diabetes mellitus with diabetic neuropathy, with long-term current use of insulin (HCC) -following with endocrine at this time. Not taking jardiance which is on medication list, verfied with Dr Loanne Drilling that this was stopped due to cost.  -continues on novolin and metformin. Does not have blood sugar readings today.    2. MCI (mild cognitive impairment) Stable. Following with neurology. Continues on aricept   3. Chronic systolic heart failure (HCC) -euvolemic at this time. Continues follow up with cardiology  4. Paroxysmal atrial fibrillation (HCC) Rate controlled. Continues on coreg. - apixaban (ELIQUIS) 5 MG TABS tablet; Take 1 tablet (5 mg total) by mouth 2 (two) times daily.  Dispense: 180 tablet; Refill: 1  5. Other constipation -controlled at this time   6. Acute non-recurrent pansinusitis neti pot twice daily Plain nasal saline spray throughout the day as needed May use tylenol 325 mg 2 tablets every 6 hours as needed aches and pains or sore throat humidifier in the home to help with the dry air Mucinex DM by mouth twice daily as needed for cough and congestion with full glass of water  Keep well hydrated Avoid forcefully blowing nose - amoxicillin-clavulanate (AUGMENTIN) 875-125 MG tablet; Take 1 tablet by mouth 2 (two) times daily.  Dispense: 20 tablet; Refill: 0 - fluticasone (FLONASE) 50 MCG/ACT nasal spray; Place 1 spray into both nostrils 2 (two) times daily as needed for allergies or rhinitis.  Dispense: 16 g; Refill: 1  7. Gastroesophageal reflux disease, esophagitis presence not specified Stable   8. Essential hypertension Blood pressure at goal. Continues on coreg.   9. Hyperlipidemia, unspecified hyperlipidemia type LDL not at goal. Has been referred to lipid  clinic at this time. Continues on lipitor 80 mg daily.   Next appt: 6 months Amor Hyle K. [place], [facility] [phone]

## 2018-08-07 ENCOUNTER — Telehealth: Payer: Self-pay | Admitting: Pharmacist

## 2018-08-07 NOTE — Addendum Note (Signed)
Addended by: [name] on: 08/07/2018 01:04 PM   Modules accepted: Orders

## 2018-08-07 NOTE — Telephone Encounter (Signed)
Spoke with patient - he has not started Repatha or Jardiance due to cost. He has Fiserv but copays are prohibitive. Had previously discussed applying to Medicaid or Medicare [place] programs. Pt states that he applied for Medicaid the other day and it may take 45 - 90 days to hear back regarding his application. Will plan to reach out to patient in another month or two to see if he has heard back.

## 2018-08-09 ENCOUNTER — Other Ambulatory Visit (HOSPITAL_COMMUNITY): Payer: Self-pay

## 2018-08-09 NOTE — Progress Notes (Signed)
Paramedicine Encounter    Patient ID: Calvin [name], male    DOB: Nov 15, 1960, 58 y.o.   MRN: [id number]   [name], NP as PCP - General (Geriatric Medicine) [name], MD as PCP - Cardiology (Cardiology)  Patient Active Problem List   Diagnosis Date Noted  . Nausea and vomiting 03/10/2018  . AF (paroxysmal atrial fibrillation) ([place]) 03/10/2018  . Non-cardiac chest pain 03/09/2018  . Diabetic neuropathy ([place]) 09/26/2017  . MCI (mild cognitive impairment) 06/23/2017  . OSA on CPAP 05/10/2017  . Gastroesophageal reflux disease 05/10/2017  . Insomnia 05/10/2017  . Onychomycosis of multiple toenails with type 2 diabetes mellitus and peripheral neuropathy ([place]) 05/10/2017  . Chronic systolic heart failure ([place]) 11/22/2015  . Essential hypertension 11/22/2015  . DM (diabetes mellitus) ([place]) 11/20/2015  . HLD (hyperlipidemia) 11/20/2015  . Hx of adenomatous colonic polyps 05/19/2011    Current Outpatient Medications:  .  apixaban (ELIQUIS) 5 MG TABS tablet, Take 1 tablet (5 mg total) by mouth 2 (two) times daily., Disp: 180 tablet, Rfl: 1 .  atorvastatin (LIPITOR) 80 MG tablet, Take 1 tablet (80 mg total) by mouth daily., Disp: 90 tablet, Rfl: 3 .  Blood Glucose Monitoring Suppl (TRUE METRIX AIR GLUCOSE METER) w/Device KIT, 1 strip 3 (three) times daily by Does not apply route. Use 1 strip to check blood sugar three (3) times daily. Dx:E11.9, Disp: 1 kit, Rfl: 0 .  carvedilol (COREG) 3.125 MG tablet, Take 6.25 mg by mouth 2 (two) times daily with a meal. , Disp: , Rfl:  .  donepezil (ARICEPT) 10 MG tablet, Take 1 tablet (10 mg total) by mouth at bedtime., Disp: 30 tablet, Rfl: 11 .  fluticasone (FLONASE) 50 MCG/ACT nasal spray, Place 1 spray into both nostrils 2 (two) times daily as needed for allergies or rhinitis., Disp: 16 g, Rfl: 1 .  glucose blood (ONETOUCH VERIO) test strip, 1 each by Other route 2 (two) times daily. And lancets 2/day.,  Disp: 100 each, Rfl: 12 .  insulin NPH-regular Human (NOVOLIN 70/30) (70-30) 100 UNIT/ML injection, 80 units with breakfast, and 40 units with supper., Disp: 40 mL, Rfl: 3 .  Insulin Syringe-Needle U-100 (INSULIN SYRINGE .3CC/29GX1/2") 29G X 1/2" 0.3 ML MISC, Inject 1 Syringe 3 (three) times daily as directed. Check blood sugar three times daily. Dx: E11.9, Disp: 100 each, Rfl: 3 .  lisinopril (PRINIVIL,ZESTRIL) 10 MG tablet, Take 1 tablet (10 mg total) by mouth at bedtime., Disp: 30 tablet, Rfl: 6 .  metFORMIN (GLUCOPHAGE) 500 MG tablet, TAKE 2 TABLETS BY MOUTH TWICE DAILY WITH MEAL, Disp: 40 tablet, Rfl: 0 .  amoxicillin-clavulanate (AUGMENTIN) 875-125 MG tablet, Take 1 tablet by mouth 2 (two) times daily. (Patient not taking: Reported on 08/09/2018), Disp: 20 tablet, Rfl: 0 .  nitroGLYCERIN (NITROSTAT) 0.4 MG SL tablet, Place 1 tablet (0.4 mg total) under the tongue every 5 (five) minutes as needed for chest pain. (Patient not taking: Reported on 08/09/2018), Disp: 30 tablet, Rfl: 1 No Known Allergies    Social History   Socioeconomic History  . Marital status: Single    Spouse name: Not on file  . Number of children: 0  . Years of education: Not on file  . Highest education level: Not on file  Occupational History  . Occupation: Disability  Social Needs  . Financial resource strain: Not on file  . Food insecurity:    Worry: Not on file    Inability: Not on file  .  Transportation needs:    Medical: Not on file    Non-medical: Not on file  Tobacco Use  . Smoking status: Never Smoker  . Smokeless tobacco: Never Used  Substance and Sexual Activity  . Alcohol use: No  . Drug use: No  . Sexual activity: Yes    Birth control/protection: None  Lifestyle  . Physical activity:    Days per week: Not on file    Minutes per session: Not on file  . Stress: Not on file  Relationships  . Social connections:    Talks on phone: Not on file    Gets together: Not on file    Attends  religious service: Not on file    Active member of club or organization: Not on file    Attends meetings of clubs or organizations: Not on file    Relationship status: Not on file  . Intimate partner violence:    Fear of current or ex partner: Not on file    Emotionally abused: Not on file    Physically abused: Not on file    Forced sexual activity: Not on file  Other Topics Concern  . Not on file  Social History Narrative   Social History      Diet?       Do you drink/eat things with caffeine? yes      Marital status?       single                             What year were you married?      Do you live in a house, apartment, assisted living, condo, trailer, etc.? yes      Is it one or more stories? One story      How many persons live in your home?      Do you have any pets in your home? (please list) none      Highest level of education completed? graduate      Current or past profession:      Do you exercise?            no                          Type & how often?      Advanced Directives      Do you have a living will? no      Do you have a DNR form?                                  If not, do you want to discuss one? no      Do you have signed POA/HPOA for forms? no      Functional Status      Do you have difficulty bathing or dressing yourself? no      Do you have difficulty preparing food or eating? no      Do you have difficulty managing your medications? no      Do you have difficulty managing your finances? no      Do you have difficulty affording your medications? no    Physical Exam      Future Appointments  Date Time Provider [place]  08/27/2018  2:00 PM [facility]ACY [facility] None  09/12/2018 10:00 AM [name], MD [facility] None  09/14/2018 10:00 AM [facility] PA/NP [facility] None  10/18/2018  9:15 AM Marzetta Board, DPM [facility]  12/26/2018  8:30 AM Cameron Sprang, MD [facility] None  02/04/2019 11:00 AM  [name], NP [facility] None    BP 118/72   Pulse 82   Resp 15   Wt 148 lb (67.1 kg)   SpO2 98%   BMI 25.40 kg/m   Weight yesterday-148 Last visit weight-147 CBG'S- 230-240  Pt reports doing ok, he was recently given antibiotic for productive cough of brown colored sputum, pt denies any sob, no dizziness, he has not picked up the antibiotic yet but plans to get it next week. He also needs metformin for refill, he said he will pick it up as well.  Just started the eliquis today, it was question if he needed it since he has been off of it so long and his last EKG was NSR. But it was confirmed by Lattie Haw, pharmacist yesterday for him to start it again.  meds verified, no missed doses of meds, pill box refilled.   Marylouise Stacks, [place] [facility] Paramedic  08/09/18

## 2018-08-10 ENCOUNTER — Telehealth (HOSPITAL_COMMUNITY): Payer: Self-pay | Admitting: Licensed Clinical Social Worker

## 2018-08-10 NOTE — Telephone Encounter (Signed)
CSW consulted to assist with medication costs.  CSW spoke with pt who confirms that his biggest problem at this time is paying for Eliquis (over $100 copay at this time).  Pt has applied for Medicaid but won't hear back for up to 3 months.  CSW mailed Eliquis assistance program application to pt to complete- no grant options available for pt at this time  Pt to complete application and return to clinic  CSW will continue to follow and assist as needed  Jorge Ny, [place] Worker [facility] [phone]

## 2018-08-16 ENCOUNTER — Other Ambulatory Visit (HOSPITAL_COMMUNITY): Payer: Self-pay

## 2018-08-16 NOTE — Progress Notes (Signed)
Paramedicine Encounter    Patient ID: Calvin [name], male    DOB: 09-13-1960, 58 y.o.   MRN: [id number]   [name], NP as PCP - General (Geriatric Medicine) [name], MD as PCP - Cardiology (Cardiology)  Patient Active Problem List   Diagnosis Date Noted  . Nausea and vomiting 03/10/2018  . AF (paroxysmal atrial fibrillation) ([place]) 03/10/2018  . Non-cardiac chest pain 03/09/2018  . Diabetic neuropathy ([place]) 09/26/2017  . MCI (mild cognitive impairment) 06/23/2017  . OSA on CPAP 05/10/2017  . Gastroesophageal reflux disease 05/10/2017  . Insomnia 05/10/2017  . Onychomycosis of multiple toenails with type 2 diabetes mellitus and peripheral neuropathy ([place]) 05/10/2017  . Chronic systolic heart failure ([place]) 11/22/2015  . Essential hypertension 11/22/2015  . DM (diabetes mellitus) ([place]) 11/20/2015  . HLD (hyperlipidemia) 11/20/2015  . Hx of adenomatous colonic polyps 05/19/2011    Current Outpatient Medications:  .  apixaban (ELIQUIS) 5 MG TABS tablet, Take 1 tablet (5 mg total) by mouth 2 (two) times daily., Disp: 180 tablet, Rfl: 1 .  atorvastatin (LIPITOR) 80 MG tablet, Take 1 tablet (80 mg total) by mouth daily., Disp: 90 tablet, Rfl: 3 .  Blood Glucose Monitoring Suppl (TRUE METRIX AIR GLUCOSE METER) w/Device KIT, 1 strip 3 (three) times daily by Does not apply route. Use 1 strip to check blood sugar three (3) times daily. Dx:E11.9, Disp: 1 kit, Rfl: 0 .  carvedilol (COREG) 3.125 MG tablet, Take 6.25 mg by mouth 2 (two) times daily with a meal. , Disp: , Rfl:  .  donepezil (ARICEPT) 10 MG tablet, Take 1 tablet (10 mg total) by mouth at bedtime., Disp: 30 tablet, Rfl: 11 .  fluticasone (FLONASE) 50 MCG/ACT nasal spray, Place 1 spray into both nostrils 2 (two) times daily as needed for allergies or rhinitis., Disp: 16 g, Rfl: 1 .  glucose blood (ONETOUCH VERIO) test strip, 1 each by Other route 2 (two) times daily. And lancets 2/day.,  Disp: 100 each, Rfl: 12 .  insulin NPH-regular Human (NOVOLIN 70/30) (70-30) 100 UNIT/ML injection, 80 units with breakfast, and 40 units with supper., Disp: 40 mL, Rfl: 3 .  Insulin Syringe-Needle U-100 (INSULIN SYRINGE .3CC/29GX1/2") 29G X 1/2" 0.3 ML MISC, Inject 1 Syringe 3 (three) times daily as directed. Check blood sugar three times daily. Dx: E11.9, Disp: 100 each, Rfl: 3 .  lisinopril (PRINIVIL,ZESTRIL) 10 MG tablet, Take 1 tablet (10 mg total) by mouth at bedtime., Disp: 30 tablet, Rfl: 6 .  metFORMIN (GLUCOPHAGE) 500 MG tablet, TAKE 2 TABLETS BY MOUTH TWICE DAILY WITH MEAL, Disp: 40 tablet, Rfl: 0 .  amoxicillin-clavulanate (AUGMENTIN) 875-125 MG tablet, Take 1 tablet by mouth 2 (two) times daily. (Patient not taking: Reported on 08/09/2018), Disp: 20 tablet, Rfl: 0 .  nitroGLYCERIN (NITROSTAT) 0.4 MG SL tablet, Place 1 tablet (0.4 mg total) under the tongue every 5 (five) minutes as needed for chest pain. (Patient not taking: Reported on 08/09/2018), Disp: 30 tablet, Rfl: 1 No Known Allergies    Social History   Socioeconomic History  . Marital status: Single    Spouse name: Not on file  . Number of children: 0  . Years of education: Not on file  . Highest education level: Not on file  Occupational History  . Occupation: Disability  Social Needs  . Financial resource strain: Not on file  . Food insecurity:    Worry: Not on file    Inability: Not on file  .  Transportation needs:    Medical: Not on file    Non-medical: Not on file  Tobacco Use  . Smoking status: Never Smoker  . Smokeless tobacco: Never Used  Substance and Sexual Activity  . Alcohol use: No  . Drug use: No  . Sexual activity: Yes    Birth control/protection: None  Lifestyle  . Physical activity:    Days per week: Not on file    Minutes per session: Not on file  . Stress: Not on file  Relationships  . Social connections:    Talks on phone: Not on file    Gets together: Not on file    Attends  religious service: Not on file    Active member of club or organization: Not on file    Attends meetings of clubs or organizations: Not on file    Relationship status: Not on file  . Intimate partner violence:    Fear of current or ex partner: Not on file    Emotionally abused: Not on file    Physically abused: Not on file    Forced sexual activity: Not on file  Other Topics Concern  . Not on file  Social History Narrative   Social History      Diet?       Do you drink/eat things with caffeine? yes      Marital status?       single                             What year were you married?      Do you live in a house, apartment, assisted living, condo, trailer, etc.? yes      Is it one or more stories? One story      How many persons live in your home?      Do you have any pets in your home? (please list) none      Highest level of education completed? graduate      Current or past profession:      Do you exercise?            no                          Type & how often?      Advanced Directives      Do you have a living will? no      Do you have a DNR form?                                  If not, do you want to discuss one? no      Do you have signed POA/HPOA for forms? no      Functional Status      Do you have difficulty bathing or dressing yourself? no      Do you have difficulty preparing food or eating? no      Do you have difficulty managing your medications? no      Do you have difficulty managing your finances? no      Do you have difficulty affording your medications? no    Physical Exam      Future Appointments  Date Time Provider [place]  08/27/2018  2:00 PM [facility] PHARMACY [facility] None  09/12/2018 10:00 AM [name], MD [facility] None  09/14/2018 10:00 AM [facility] PA/NP [facility] None  10/18/2018  9:15 AM Marzetta Board, DPM [facility]  12/26/2018  8:30 AM Cameron Sprang, MD [facility] None  02/04/2019 11:00 AM  [name], NP [facility] None    BP 128/70   Pulse 90   Resp 15   SpO2 99%   Weight yesterday-146  Last visit weight-148  Pt reports feeling well, he was up sweeping and cleaning his apt when I arrived.  No missed doses of his meds. meds verified and pill box refilled.  His cough has improved, he no longer feels the need for the antibiotic so he will tell pharmacy to put it back. He will need to get the metformin and finish placing it in his pill box.  He said a lady from [place] was suppose to come out to see him today about his meds but nobody came. Will discuss next week with [employer] what mail order options and benefits he has for this meds if this other lady doesn't show up by next week.    Marylouise Stacks, [facility] Paramedic  08/16/18

## 2018-08-20 NOTE — Telephone Encounter (Signed)
-  closing encounter only

## 2018-08-27 ENCOUNTER — Other Ambulatory Visit: Payer: Self-pay

## 2018-08-27 ENCOUNTER — Ambulatory Visit (HOSPITAL_COMMUNITY)
Admission: RE | Admit: 2018-08-27 | Discharge: 2018-08-27 | Disposition: A | Discharge status: Home / Self Care | Payer: Medicare HMO | Source: Ambulatory Visit | Attending: Cardiology | Admitting: Cardiology

## 2018-08-27 ENCOUNTER — Other Ambulatory Visit (HOSPITAL_COMMUNITY): Payer: Self-pay

## 2018-08-27 VITALS — BP 124/85 | HR 92 | Wt 150.0 lb

## 2018-08-27 DIAGNOSIS — Z79899 Other long term (current) drug therapy: Secondary | ICD-10-CM | POA: Insufficient documentation

## 2018-08-27 DIAGNOSIS — E1140 Type 2 diabetes mellitus with diabetic neuropathy, unspecified: Secondary | ICD-10-CM

## 2018-08-27 DIAGNOSIS — I252 Old myocardial infarction: Secondary | ICD-10-CM | POA: Diagnosis not present

## 2018-08-27 DIAGNOSIS — I110 Hypertensive heart disease with heart failure: Secondary | ICD-10-CM | POA: Insufficient documentation

## 2018-08-27 DIAGNOSIS — I5022 Chronic systolic (congestive) heart failure: Secondary | ICD-10-CM | POA: Diagnosis not present

## 2018-08-27 DIAGNOSIS — Z7901 Long term (current) use of anticoagulants: Secondary | ICD-10-CM | POA: Insufficient documentation

## 2018-08-27 DIAGNOSIS — E1165 Type 2 diabetes mellitus with hyperglycemia: Secondary | ICD-10-CM | POA: Insufficient documentation

## 2018-08-27 DIAGNOSIS — I428 Other cardiomyopathies: Secondary | ICD-10-CM | POA: Diagnosis not present

## 2018-08-27 DIAGNOSIS — R413 Other amnesia: Secondary | ICD-10-CM | POA: Insufficient documentation

## 2018-08-27 DIAGNOSIS — Z794 Long term (current) use of insulin: Principal | ICD-10-CM

## 2018-08-27 DIAGNOSIS — R5383 Other fatigue: Secondary | ICD-10-CM | POA: Insufficient documentation

## 2018-08-27 LAB — BASIC METABOLIC PANEL
Anion gap: 10 (ref 5–15)
BUN: 9 mg/dL (ref 6–20)
CO2: 24 mmol/L (ref 22–32)
Calcium: 8.8 mg/dL — ABNORMAL LOW (ref 8.9–10.3)
Chloride: 100 mmol/L (ref 98–111)
Creatinine, Ser: 1.22 mg/dL (ref 0.61–1.24)
GFR calc Af Amer: >60 mL/min (ref >60)
GFR calc non Af Amer: >60 mL/min (ref >60)
Glucose, Bld: 316 mg/dL — ABNORMAL HIGH (ref 70–99)
Potassium: 4.3 mmol/L (ref 3.5–5.1)
Sodium: 134 mmol/L — ABNORMAL LOW (ref 135–145)

## 2018-08-27 MED ORDER — SPIRONOLACTONE 25 MG PO TABS: 12.5000 mg | ORAL_TABLET | Freq: Every day | ORAL | 2 refills | Status: DC

## 2018-08-27 MED ORDER — METFORMIN HCL 500 MG PO TABS: ORAL_TABLET | ORAL | 2 refills | Status: DC

## 2018-08-27 NOTE — Patient Instructions (Signed)
It was nice meeting you today in the Heart failure clinic at [facility]    Medication changes: Depending on what your lab work shows, we mad add spironolactone 12.5 mg daily. I will call you with the lab results and if this will be the plan.  Look into if [facility] will cover a blood pressure monitor and the potential for mail order prescriptions.     Follow up with PA/NP in 3 weeks   Do the following things EVERYDAY: 1. Weigh yourself in the morning before breakfast. Write it down and keep it in a log. 2. Take your medicines as prescribed 3. Eat low salt foods-Limit salt (sodium) to 2000 mg per day.  4. Stay as active as you can everyday 5. Limit all fluids for the day to less than 2 liters

## 2018-08-27 NOTE — Progress Notes (Signed)
Paramedicine Encounter   Patient ID: Calvin [name] , male,   DOB: [birth year],57 y.o.,  MRN: [id number]   Met patient in clinic today with provider.   B/P-124/86 P-92 SP02- Weight @ clinic-163  Pt was unable to meet last week, he called me and reported he had to go out of town abruptly for an emergency. I just reminded him to take his meds with him. Pt reports he needs new rx sent in to pharmacy so they can fill it--It was the metformin that he needs-will have to get PCP or dr Loanne Drilling office to refill it. He reports sometimes when he walks he will begin to trip--he denies any increased sob or dizziness when this occurs.  He does get dizzy if he stands up too fast--he takes his time doing this.  Little appetite. Still not sleeping well at night.  Need to look into mail order pharmacy for better pricing. Lab work done and depending on the results if pt will be started on spiro--pharmacist will call pt after results are in and discuss plan.  pts pill box filled by pharmacist--I called dr Loanne Drilling office to order his metformin and they will send in refill.  Pt had atorvastatin in his metformin bottle--when I was there the other week he said he had another bottle of metformin upstairs as he was running out the other week at our visit. He must have just placed those other pills in the wrong bottle.   Marylouise Stacks, EMT-Paramedic [phone] 08/27/2018  ACTION: Home visit completed

## 2018-08-27 NOTE — Progress Notes (Signed)
Primary Care: Dr [name]  Primary Cardiologist: Dr Angelena Form  Neurology: Dr Delice Lesch   HPI: [name] is 58 year old with a history of chronic systolic heart failure due to NICM with EF 35-40%, uncontrolled DMII, GERD, HTN, MI 2015, memory issues, and hyperlipidemia. Of note he was homeless for 6 years but was able to get housing in February of this year.   Admitted with 08/2017 with chest pain. 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  Readmitted in 10/2017 with chest pain. CTA was negative for PE. Marland KitchenHe was also admitted in 03/2018 of this year with chest pain. He had a brief episode of A fib and was started on eliquis and carvedilol was increased to 6.25 mg twice a day.  This was not thought to be ACS.   In the past he was on entresto but this was later stopped due to dizziness.  He was last seen by Ellen Henri [name] on 04/20/2018. From HF perspective he was stable. he was stable at that time. He was referred to HF clinic at that time. In November he was seen by Neurology for memory loss. He has been donepezil for several months. He was referred for sleep study.   Today he returns for pharmacist-led HF medication titration. At last visit with Darrick Grinder, NP, his carvedilol was increased to 6.25 mg twice daily. He is followed by paramedicine, last seen 08/16/2018. BP 128/70 mmHg, HR 90 bpm. Katie from paramedicine was present for visit today. He reports he gets tired sometimes, but overall feeling great. He likes walking (about an hour a day), and will choose to walk home sometimes instead of taking the bus. Patient brought in his medications, reviewed medications and doses with Katie from paramedicine who confirms compliance. He has ongoing memory issues. Not weighing himself at home. Lives with his goddaughter and uncle. Requires transportation assistance. Gets food stamps.     . Shortness of breath/dyspnea on exertion? no  . Orthopnea/PND? no . Edema?  no . Lightheadedness/dizziness? no . Daily weights at home? Yes (home 150-155 lb) . Blood pressure/heart rate monitoring at home? No - paramedicine checks, has been pretty controlled  . Following low-sodium/fluid-restricted diet? yes  HF Medications: Carvedilol 6.25 mg twice daily Lisinopril 10 mg daily  Has the patient been experiencing any side effects to the medications prescribed?  No    Does the patient have any problems obtaining medications due to transportation or finances?   Yes patient receiving Eliquis through patient assistance   Understanding of regimen: good Understanding of indications: good Potential of compliance: good Patient understands to avoid NSAIDs. Patient understands to avoid decongestants.    Pertinent Lab Values: . 08/27/2018: Scr 1.22, K 4.3 . 07/16/2018: Scr 1.45, K 3.8 . 07/02/18: Scr 1.31, K 4.4 . 05/07/18: Scr 1.34, K 4.5   Vital Signs: . Weight: 163lb (home dry weight: 150-155) . Blood pressure:124/85 mmHg  . Heart rate: 92 bpm   Assessment: 1. Chronic systolic CHF (EF 81-19%, 07/4780), due to NICM. NYHA class II symptoms. - volume stable on exam, reports stable weights at home 150-155 lb - Continue carvedilol 6.25 mg twice a day.   - Continue lisinopril to 10 mg at bed time.  He was intolerant entresto due to dizziness.  - Start spironolactone 12.5 mg daily, Scr/K stable today - Does not need diuretics.  - Patient reports good compliance with medications, and Katie from paramedicine confirms. He uses a pill box at home by his  bed which has helped him with compliance.  - Joellen Jersey will work with him to see if he can get a blood pressure monitor through his insurance and if mail order is a more cost-effective option  - Basic disease state pathophysiology, medication indication, mechanism and side effects reviewed at length with patient and he verbalized understanding   2. HTN - Controlled today on above medications   3. Memory Loss On Aricept  per Neurology  - Katie from paramedicine helps fill his pillbox to increase compliance  4. Uncotrolled DM - A1C 10.1. Continue on novolin and metformin per endocrine. Has appointment 3/11  5. Daytime fatigue.  - Had sleep study 07/19/2018, normal study, no significant obstructive sleep apnea noted  Plan: 1) Medication changes: Based on clinical presentation, vital signs and recent labs will start spironolactone 12.5 mg daily 2) Labs: Today and in 1 week  3) Follow-up: PA/NP 09/14/2018  Claiborne Billings, PharmD PGY2 Cardiology Pharmacy Resident 08/27/2018 5:12 PM

## 2018-09-04 ENCOUNTER — Telehealth (HOSPITAL_COMMUNITY): Payer: Self-pay

## 2018-09-05 ENCOUNTER — Other Ambulatory Visit (HOSPITAL_COMMUNITY): Payer: Medicare HMO

## 2018-09-06 ENCOUNTER — Telehealth (HOSPITAL_COMMUNITY): Payer: Self-pay | Admitting: Pharmacist

## 2018-09-06 NOTE — Telephone Encounter (Signed)
Called patient regarding canceled lab appointment. He is currently out of town for a funeral and will return on Saturday. Rescheduled BMET f/u for newly start spironolactone for Monday.

## 2018-09-10 ENCOUNTER — Ambulatory Visit (HOSPITAL_COMMUNITY)
Admission: RE | Admit: 2018-09-10 | Discharge: 2018-09-10 | Disposition: A | Discharge status: Home / Self Care | Payer: Medicare HMO | Source: Ambulatory Visit | Attending: Internal Medicine | Admitting: Internal Medicine

## 2018-09-10 DIAGNOSIS — I5022 Chronic systolic (congestive) heart failure: Secondary | ICD-10-CM | POA: Insufficient documentation

## 2018-09-10 LAB — BASIC METABOLIC PANEL
Anion gap: 6 (ref 5–15)
BUN: 13 mg/dL (ref 6–20)
CO2: 26 mmol/L (ref 22–32)
Calcium: 9.2 mg/dL (ref 8.9–10.3)
Chloride: 103 mmol/L (ref 98–111)
Creatinine, Ser: 1.29 mg/dL — ABNORMAL HIGH (ref 0.61–1.24)
GFR calc Af Amer: >60 mL/min (ref >60)
GFR calc non Af Amer: >60 mL/min (ref >60)
Glucose, Bld: 260 mg/dL — ABNORMAL HIGH (ref 70–99)
Potassium: 4.2 mmol/L (ref 3.5–5.1)
Sodium: 135 mmol/L (ref 135–145)

## 2018-09-12 ENCOUNTER — Other Ambulatory Visit: Payer: Self-pay

## 2018-09-12 ENCOUNTER — Ambulatory Visit (INDEPENDENT_AMBULATORY_CARE_PROVIDER_SITE_OTHER): Payer: Medicare HMO | Admitting: Endocrinology

## 2018-09-12 ENCOUNTER — Encounter: Payer: Self-pay | Admitting: Endocrinology

## 2018-09-12 VITALS — BP 124/70 | HR 86 | Ht 64.0 in | Wt 163.4 lb

## 2018-09-12 DIAGNOSIS — E1140 Type 2 diabetes mellitus with diabetic neuropathy, unspecified: Secondary | ICD-10-CM

## 2018-09-12 DIAGNOSIS — Z794 Long term (current) use of insulin: Secondary | ICD-10-CM | POA: Diagnosis not present

## 2018-09-12 LAB — POCT GLYCOSYLATED HEMOGLOBIN (HGB A1C): Hemoglobin A1C: 10.9 % — AB (ref 4.0–5.6)

## 2018-09-12 MED ORDER — INSULIN NPH ISOPHANE & REGULAR (70-30) 100 UNIT/ML ~~LOC~~ SUSP: SUBCUTANEOUS | 3 refills | Status: DC

## 2018-09-12 NOTE — Patient Instructions (Addendum)
check your blood sugar twice a day.  vary the time of day when you check, between before the 3 meals, and at bedtime.  also check if you have symptoms of your blood sugar being too high or too low.  please keep a record of the readings and bring it to your next appointment here (or you can bring the meter itself).  You can write it on any piece of paper.  please call us sooner if your blood sugar goes below 70, or if you have a lot of readings over 200. Please increase the insulin to 90 units with breakfast, and 50 units with supper.   Here are some numbers to call, to get a new meter and strips.  Because of your kidneys, please stop taking the metformin.    Please come back for a follow-up appointment in 2 months.

## 2018-09-12 NOTE — Progress Notes (Signed)
Subjective:    Patient ID: Calvin [name], male    DOB: January 06, 1961, 58 y.o.   MRN: [id number]  HPI Pt returns for f/u of diabetes mellitus: DM type: Insulin-requiring type 2. Dx'ed: 3009 Complications: polyneuropathy, CAD, and DR Therapy: insulin since soon after dx.  DKA: never Severe hypoglycemia: never Pancreatitis: never Pancreatic imaging: normal on 2003 CT. Other: he takes human insulin, due to cost; due to noncompliance, he is not a candidate for multiple daily injections.   Interval history: He says he never misses the insulin.  Pt says he lost cbg meter.  pt states he feels well in general. Past Medical History:  Diagnosis Date  . Adenoidal hypertrophy   . Adenomatous colon polyps 2012  . Arthritis   . Diabetes mellitus   . GERD (gastroesophageal reflux disease)   . Heart attack ([place])    [place] living in [place]  [name] Hx of adenomatous colonic polyps 08/18/2017  . Hyperlipidemia   . Hypertension   . Mild CAD    a. cath in 08/2017 showing mild nonobstructive CAD with scattered 20-30% stenosis.   . Nonischemic cardiomyopathy ([place])    a. EF 20-25% by echo in 09/2017 with cath showing mild CAD. b.  Last echo 12/2017 EF 35-40%, grade 2 DD.  [name] Obesity   . PAF (paroxysmal atrial fibrillation) ([place])   . Sleep apnea    cpap    Past Surgical History:  Procedure Laterality Date  . COLONOSCOPY  05/13/11   9 adenomas  . FOREARM SURGERY    . MUSCLE BIOPSY    . RIGHT/LEFT HEART CATH AND CORONARY ANGIOGRAPHY N/A 08/25/2017   Procedure: RIGHT/LEFT HEART CATH AND CORONARY ANGIOGRAPHY;  Surgeon: [name], MD;  Location: [facility];  Service: Cardiovascular;  Laterality: N/A;    Social History   Socioeconomic History  . Marital status: Single    Spouse name: Not on file  . Number of children: 0  . Years of education: Not on file  . Highest education level: Not on file  Occupational History  . Occupation: Disability  Social Needs  . Financial resource  strain: Not on file  . Food insecurity:    Worry: Not on file    Inability: Not on file  . Transportation needs:    Medical: Not on file    Non-medical: Not on file  Tobacco Use  . Smoking status: Never Smoker  . Smokeless tobacco: Never Used  Substance and Sexual Activity  . Alcohol use: No  . Drug use: No  . Sexual activity: Yes    Birth control/protection: None  Lifestyle  . Physical activity:    Days per week: Not on file    Minutes per session: Not on file  . Stress: Not on file  Relationships  . Social connections:    Talks on phone: Not on file    Gets together: Not on file    Attends religious service: Not on file    Active member of club or organization: Not on file    Attends meetings of clubs or organizations: Not on file    Relationship status: Not on file  . Intimate partner violence:    Fear of current or ex partner: Not on file    Emotionally abused: Not on file    Physically abused: Not on file    Forced sexual activity: Not on file  Other Topics Concern  . Not on file  Social History Narrative   Social History  Diet?       Do you drink/eat things with caffeine? yes      Marital status?       single                             What year were you married?      Do you live in a house, apartment, assisted living, condo, trailer, etc.? yes      Is it one or more stories? One story      How many persons live in your home?      Do you have any pets in your home? (please list) none      Highest level of education completed? graduate      Current or past profession:      Do you exercise?            no                          Type & how often?      Advanced Directives      Do you have a living will? no      Do you have a DNR form?                                  If not, do you want to discuss one? no      Do you have signed POA/HPOA for forms? no      Functional Status      Do you have difficulty bathing or dressing yourself? no       Do you have difficulty preparing food or eating? no      Do you have difficulty managing your medications? no      Do you have difficulty managing your finances? no      Do you have difficulty affording your medications? no    Current Outpatient Medications on File Prior to Visit  Medication Sig Dispense Refill  . apixaban (ELIQUIS) 5 MG TABS tablet Take 1 tablet (5 mg total) by mouth 2 (two) times daily. 180 tablet 1  . atorvastatin (LIPITOR) 80 MG tablet Take 1 tablet (80 mg total) by mouth daily. 90 tablet 3  . carvedilol (COREG) 3.125 MG tablet Take 6.25 mg by mouth 2 (two) times daily with a meal.     . donepezil (ARICEPT) 10 MG tablet Take 1 tablet (10 mg total) by mouth at bedtime. 30 tablet 11  . fluticasone (FLONASE) 50 MCG/ACT nasal spray Place 1 spray into both nostrils 2 (two) times daily as needed for allergies or rhinitis. 16 g 1  . Insulin Syringe-Needle U-100 (INSULIN SYRINGE .3CC/29GX1/2") 29G X 1/2" 0.3 ML MISC Inject 1 Syringe 3 (three) times daily as directed. Check blood sugar three times daily. Dx: E11.9 100 each 3  . lisinopril (PRINIVIL,ZESTRIL) 10 MG tablet Take 1 tablet (10 mg total) by mouth at bedtime. 30 tablet 6  . nitroGLYCERIN (NITROSTAT) 0.4 MG SL tablet Place 1 tablet (0.4 mg total) under the tongue every 5 (five) minutes as needed for chest pain. (Patient not taking: Reported on 09/14/2018) 30 tablet 1  . spironolactone (ALDACTONE) 25 MG tablet Take 0.5 tablets (12.5 mg total) by mouth daily. (Patient not taking: Reported on 09/14/2018) 45 tablet 2   No current facility-administered medications on file prior to  visit.     No Known Allergies  Family History  Problem Relation Age of Onset  . Heart disease Mother   . Heart attack Mother 30  . Hypertension Mother   . Hyperlipidemia Mother   . Diabetes Mother   . Heart disease Father   . Heart attack Father 51  . Hypertension Father   . Hyperlipidemia Father   . Diabetes Father   . Heart attack Sister  53  . Colon cancer Neg Hx   . Stomach cancer Neg Hx   . Esophageal cancer Neg Hx   . Rectal cancer Neg Hx     BP 124/70 (BP Location: Left Arm, Patient Position: Sitting, Cuff Size: Normal)   Pulse 86   Ht 5' 4"  (1.626 m)   Wt 163 lb 6.4 oz (74.1 kg)   SpO2 95%   BMI 28.05 kg/m    Review of Systems He denies hypoglycemia.     Objective:   Physical Exam VITAL SIGNS:  See vs page GENERAL: no distress Pulses: dorsalis pedis intact bilat.   MSK: no deformity of the feet CV: no leg edema Skin:  no ulcer on the feet.  normal color and temp on the feet. Neuro: sensation is intact to touch on the feet Ext: very long toenails.  Lab Results  Component Value Date   HGBA1C 10.9 (A) 09/12/2018       Assessment & Plan:  Insulin-requiring type 2 DM, with CAD: worse Noncompliance with cbg recording.    Patient Instructions  check your blood sugar twice a day.  vary the time of day when you check, between before the 3 meals, and at bedtime.  also check if you have symptoms of your blood sugar being too high or too low.  please keep a record of the readings and bring it to your next appointment here (or you can bring the meter itself).  You can write it on any piece of paper.  please call us sooner if your blood sugar goes below 70, or if you have a lot of readings over 200. Please increase the insulin to 90 units with breakfast, and 50 units with supper.   Here are some numbers to call, to get a new meter and strips.  Because of your kidneys, please stop taking the metformin.    Please come back for a follow-up appointment in 2 months.

## 2018-09-13 ENCOUNTER — Other Ambulatory Visit (HOSPITAL_COMMUNITY): Payer: Self-pay

## 2018-09-13 NOTE — Progress Notes (Signed)
[facility]  Primary Care: Dr [name]  Primary Cardiologist: Dr Angelena Form  Neurology: Dr Delice Lesch   HPI: Calvin [name] is a 58 year old referred to the HF clinic by Dr [name] for HF evaluation.   Calvin [name] is a 58 y.o. male with a history of chronic systolic heart failure due to NICM with EF 35-40%, uncontrolled DMII, GERD, HTN, MI 2015, memory issues, and hyperlipidemia. Of note he was homeless for 6 years but was able to get housing in February of this year.   Admitted with 08/2017 with chest pain. 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  Readmitted in 10/2017 with chest pain. CTA was negative for PE. Marland KitchenHe was also admitted in 03/2018 of this year with chest pain. He had a brief episode of A fib and was started on eliquis and carvedilol was increased to 6.25 mg twice a day.  This was not thought to be ACS.   In the past he was on entresto but this was later stopped due to dizziness.  He was seen by Ellen Henri [name] on 04/20/2018. From HF perspective he was stable. he was stable at that time. He was referred to HF clinic at that time. In November he was seen by Neurology for memory loss. He has been donepezil for several months. He was referred for sleep study.   He returns today for HF follow up. Has ongoing memory issues. Sleep study came back normal. Last visit coreg was increased. He had pharm visit and spiro was started. Repeat labs were stable. Overall doing fine. Did not get spiro because it was over $30 at his pharmacy. Today he has a headache and feels lightheaded. Also has chills. No body aches or cough. Symptoms just started this morning. He gets SOB after walking long distances. + fatigue. Does not sleep much. Chronic 3-4 pillow orthopnea. No edema. He has been tripping when walking, had a few episodes where he caught himself from falling. Sometimes gets dizzy standing, but not always. No CP. Lives with his goddaughter and uncle. Requires  transportation assistance. Gets food stamps. Followed by Paramedicine, but has not been seen in a few weeks. Taking all medications. He filled his pillbox this past month. He missed Katie's visit yesterday. Limits fluid and salt intake.    ECHO 12/2017  EF 35-40% Grade II DD. Moderate global reduction in LV systolic function; moderate   diastolic dysfunction. ECHO 09/2017  EF 20-25% Grade IDD  LHC/RHC 08/2017  RA 5  PA 22/11 (15)  PCWP 9  CO 6 CI 3/14   Prox RCA to Mid RCA lesion is 30% stenosed.  Mid RCA to Dist RCA lesion is 30% stenosed.  Prox Cx to Dist Cx lesion is 20% stenosed.  Mid LAD lesion is 30% stenosed.  There is mild to moderate left ventricular systolic dysfunction.  LV end diastolic pressure is mildly elevated.  The left ventricular ejection fraction is 35-45% by visual estimate.  There is no mitral valve regurgitation.    SH: Disabled for 6 years due to diabetes. He does not smoke. Denies cocaine abuse.   Review of systems complete and found to be negative unless listed in HPI.   Past Medical History:  Diagnosis Date  . Adenoidal hypertrophy   . Adenomatous colon polyps 2012  . Arthritis   . Diabetes mellitus   . GERD (gastroesophageal reflux disease)   . Heart attack ([place])    While living in [place]  [name]  Hx of adenomatous colonic polyps 08/18/2017  . Hyperlipidemia   . Hypertension   . Mild CAD    a. cath in 08/2017 showing mild nonobstructive CAD with scattered 20-30% stenosis.   . Nonischemic cardiomyopathy ([place])    a. EF 20-25% by echo in 09/2017 with cath showing mild CAD. b.  Last echo 12/2017 EF 35-40%, grade 2 DD.  [name] Obesity   . PAF (paroxysmal atrial fibrillation) ([place])   . Sleep apnea    cpap    Current Outpatient Medications  Medication Sig Dispense Refill  . apixaban (ELIQUIS) 5 MG TABS tablet Take 1 tablet (5 mg total) by mouth 2 (two) times daily. 180 tablet 1  . atorvastatin (LIPITOR) 80 MG tablet Take 1 tablet (80 mg total) by mouth  daily. 90 tablet 3  . carvedilol (COREG) 3.125 MG tablet Take 6.25 mg by mouth 2 (two) times daily with a meal.     . donepezil (ARICEPT) 10 MG tablet Take 1 tablet (10 mg total) by mouth at bedtime. 30 tablet 11  . fluticasone (FLONASE) 50 MCG/ACT nasal spray Place 1 spray into both nostrils 2 (two) times daily as needed for allergies or rhinitis. 16 g 1  . insulin NPH-regular Human (NOVOLIN 70/30) (70-30) 100 UNIT/ML injection 90 units with breakfast, and 50 units with supper. 40 mL 3  . Insulin Syringe-Needle U-100 (INSULIN SYRINGE .3CC/29GX1/2") 29G X 1/2" 0.3 ML MISC Inject 1 Syringe 3 (three) times daily as directed. Check blood sugar three times daily. Dx: E11.9 100 each 3  . lisinopril (PRINIVIL,ZESTRIL) 10 MG tablet Take 1 tablet (10 mg total) by mouth at bedtime. 30 tablet 6  . nitroGLYCERIN (NITROSTAT) 0.4 MG SL tablet Place 1 tablet (0.4 mg total) under the tongue every 5 (five) minutes as needed for chest pain. (Patient not taking: Reported on 09/14/2018) 30 tablet 1  . spironolactone (ALDACTONE) 25 MG tablet Take 0.5 tablets (12.5 mg total) by mouth daily. (Patient not taking: Reported on 09/14/2018) 45 tablet 2   No current facility-administered medications for this encounter.     No Known Allergies    Social History   Socioeconomic History  . Marital status: Single    Spouse name: Not on file  . Number of children: 0  . Years of education: Not on file  . Highest education level: Not on file  Occupational History  . Occupation: Disability  Social Needs  . Financial resource strain: Not on file  . Food insecurity:    Worry: Not on file    Inability: Not on file  . Transportation needs:    Medical: Not on file    Non-medical: Not on file  Tobacco Use  . Smoking status: Never Smoker  . Smokeless tobacco: Never Used  Substance and Sexual Activity  . Alcohol use: No  . Drug use: No  . Sexual activity: Yes    Birth control/protection: None  Lifestyle  . Physical  activity:    Days per week: Not on file    Minutes per session: Not on file  . Stress: Not on file  Relationships  . Social connections:    Talks on phone: Not on file    Gets together: Not on file    Attends religious service: Not on file    Active member of club or organization: Not on file    Attends meetings of clubs or organizations: Not on file    Relationship status: Not on file  . Intimate partner violence:  Fear of current or ex partner: Not on file    Emotionally abused: Not on file    Physically abused: Not on file    Forced sexual activity: Not on file  Other Topics Concern  . Not on file  Social History Narrative   Social History      Diet?       Do you drink/eat things with caffeine? yes      Marital status?       single                             What year were you married?      Do you live in a house, apartment, assisted living, condo, trailer, etc.? yes      Is it one or more stories? One story      How many persons live in your home?      Do you have any pets in your home? (please list) none      Highest level of education completed? graduate      Current or past profession:      Do you exercise?            no                          Type & how often?      Advanced Directives      Do you have a living will? no      Do you have a DNR form?                                  If not, do you want to discuss one? no      Do you have signed POA/HPOA for forms? no      Functional Status      Do you have difficulty bathing or dressing yourself? no      Do you have difficulty preparing food or eating? no      Do you have difficulty managing your medications? no      Do you have difficulty managing your finances? no      Do you have difficulty affording your medications? no      Family History  Problem Relation Age of Onset  . Heart disease Mother   . Heart attack Mother 74  . Hypertension Mother   . Hyperlipidemia Mother   . Diabetes  Mother   . Heart disease Father   . Heart attack Father 44  . Hypertension Father   . Hyperlipidemia Father   . Diabetes Father   . Heart attack Sister 72  . Colon cancer Neg Hx   . Stomach cancer Neg Hx   . Esophageal cancer Neg Hx   . Rectal cancer Neg Hx     Vitals:   09/14/18 0959  BP: 132/70  Pulse: 80  SpO2: 98%  Weight: 74.6 kg (164 lb 6.4 oz)   Wt Readings from Last 3 Encounters:  09/14/18 74.6 kg (164 lb 6.4 oz)  09/12/18 74.1 kg (163 lb 6.4 oz)  08/27/18 68 kg (150 lb)   Orthostatics: Sitting 136/86 Standing 130/92 +dizziness  PHYSICAL EXAM: General: No resp difficulty. HEENT: Normal Neck: Supple. JVP flat. Carotids 2+ bilat; no bruits. No thyromegaly or nodule noted. Cor: PMI nondisplaced. RRR, No M/G/R noted Lungs:  CTAB, normal effort. Abdomen: Soft, non-tender, non-distended, no HSM. No bruits or masses. +BS  Extremities: No cyanosis, clubbing, or rash. R and LLE no edema.  Neuro: Alert & orientedx3, cranial nerves grossly intact. moves all 4 extremities w/o difficulty. Affect pleasant   ASSESSMENT & PLAN: 1. Chronic Systolic /Diastolic Heart Failure, NICM ECHO 12/2017 EF 35-40%. Has LHC in February of this year with  Nonobstructive CAD.  - NYHA II-III. Volume stable on exam. Does not need diuretics.  - Continue carvedilol 6.25 mg twice a day.  Will not increase today with fatigue and poor appetite. - Continue lisinopril 10 mg at bed time.  He was intolerant entresto due to dizziness. - He says spiro was >$30, so he did not start. Will have Katie with HF paramedicine verify this.  - Repeat echo in 6 weeks at follow up.   2. HTN - Mildly elevated today. I am not sure how he is taking his medications. He is dizzy today, but not orthostatic. Will have Katie with HF paramedicine check on his pill box.   3. Memory Loss On Aricept per Neurology  - He needs help with medications. Now followed by HF paramedicine.   4. Uncotrolled DM - hgb A1C 10.9  09/12/18. Per PCP  5. Daytime fatigue.  - Sleep study negative 07/2018. Continues to be an issue for him. He does not sleep much.   6. Headache and chills - No cough or body aches.  - Encouraged him to follow up with PCP if symptoms do not improve.   Recent labs were stable. I am not sure if he is taking his medications correctly. Will have Katie with HF paramedicine check on him early next week to verify his pill box. Follow up in 6 weeks with Echo  Georgiana Shore, NP 10:10 AM  Greater than 50% of the 25 minute visit was spent in counseling/coordination of care regarding disease state education, salt/fluid restriction, sliding scale diuretics, and medication compliance.

## 2018-09-13 NOTE — Progress Notes (Signed)
Came out today for pt visit, however his roommate said he had to leave out and he would call me later to reschedule.   Marylouise Stacks, EMT-Paramedic  09/13/18

## 2018-09-14 ENCOUNTER — Encounter (HOSPITAL_COMMUNITY): Payer: Self-pay

## 2018-09-14 ENCOUNTER — Ambulatory Visit (HOSPITAL_COMMUNITY)
Admission: RE | Admit: 2018-09-14 | Discharge: 2018-09-14 | Disposition: A | Discharge status: Home / Self Care | Payer: Medicare HMO | Source: Ambulatory Visit | Attending: Cardiology | Admitting: Cardiology

## 2018-09-14 ENCOUNTER — Other Ambulatory Visit: Payer: Self-pay

## 2018-09-14 VITALS — BP 132/70 | HR 80 | Wt 164.4 lb

## 2018-09-14 DIAGNOSIS — I10 Essential (primary) hypertension: Secondary | ICD-10-CM

## 2018-09-14 DIAGNOSIS — Z7901 Long term (current) use of anticoagulants: Secondary | ICD-10-CM | POA: Diagnosis not present

## 2018-09-14 DIAGNOSIS — I252 Old myocardial infarction: Secondary | ICD-10-CM | POA: Diagnosis not present

## 2018-09-14 DIAGNOSIS — Z8249 Family history of ischemic heart disease and other diseases of the circulatory system: Secondary | ICD-10-CM | POA: Insufficient documentation

## 2018-09-14 DIAGNOSIS — I480 Paroxysmal atrial fibrillation: Secondary | ICD-10-CM | POA: Insufficient documentation

## 2018-09-14 DIAGNOSIS — Z794 Long term (current) use of insulin: Secondary | ICD-10-CM | POA: Insufficient documentation

## 2018-09-14 DIAGNOSIS — R5383 Other fatigue: Secondary | ICD-10-CM | POA: Diagnosis not present

## 2018-09-14 DIAGNOSIS — Z79899 Other long term (current) drug therapy: Secondary | ICD-10-CM | POA: Diagnosis not present

## 2018-09-14 DIAGNOSIS — E669 Obesity, unspecified: Secondary | ICD-10-CM | POA: Insufficient documentation

## 2018-09-14 DIAGNOSIS — M1990 Unspecified osteoarthritis, unspecified site: Secondary | ICD-10-CM | POA: Insufficient documentation

## 2018-09-14 DIAGNOSIS — K219 Gastro-esophageal reflux disease without esophagitis: Secondary | ICD-10-CM | POA: Diagnosis not present

## 2018-09-14 DIAGNOSIS — R51 Headache: Secondary | ICD-10-CM | POA: Diagnosis not present

## 2018-09-14 DIAGNOSIS — I5022 Chronic systolic (congestive) heart failure: Secondary | ICD-10-CM | POA: Diagnosis not present

## 2018-09-14 DIAGNOSIS — I428 Other cardiomyopathies: Secondary | ICD-10-CM | POA: Diagnosis not present

## 2018-09-14 DIAGNOSIS — G4730 Sleep apnea, unspecified: Secondary | ICD-10-CM | POA: Diagnosis not present

## 2018-09-14 DIAGNOSIS — R6883 Chills (without fever): Secondary | ICD-10-CM | POA: Diagnosis not present

## 2018-09-14 DIAGNOSIS — E1165 Type 2 diabetes mellitus with hyperglycemia: Secondary | ICD-10-CM | POA: Diagnosis not present

## 2018-09-14 DIAGNOSIS — I110 Hypertensive heart disease with heart failure: Secondary | ICD-10-CM | POA: Insufficient documentation

## 2018-09-14 DIAGNOSIS — I5042 Chronic combined systolic (congestive) and diastolic (congestive) heart failure: Secondary | ICD-10-CM | POA: Insufficient documentation

## 2018-09-14 DIAGNOSIS — Z833 Family history of diabetes mellitus: Secondary | ICD-10-CM | POA: Diagnosis not present

## 2018-09-14 DIAGNOSIS — R413 Other amnesia: Secondary | ICD-10-CM | POA: Diagnosis not present

## 2018-09-14 DIAGNOSIS — I2510 Atherosclerotic heart disease of native coronary artery without angina pectoris: Secondary | ICD-10-CM | POA: Diagnosis not present

## 2018-09-14 DIAGNOSIS — E785 Hyperlipidemia, unspecified: Secondary | ICD-10-CM | POA: Diagnosis not present

## 2018-09-14 NOTE — Patient Instructions (Signed)
Please follow up with Katie from paramedicine for help with your pill box  Your physician has requested that you have an echocardiogram. Echocardiography is a painless test that uses sound waves to create images of your heart. It provides your doctor with information about the size and shape of your heart and how well your heart's chambers and valves are working. This procedure takes approximately one hour. There are no restrictions for this procedure. This will be done at your next appointment.  Follow up with the Advanced Practice Provider in 6 weeks.

## 2018-09-17 ENCOUNTER — Other Ambulatory Visit (HOSPITAL_COMMUNITY): Payer: Self-pay

## 2018-09-17 MED ORDER — ATORVASTATIN CALCIUM 80 MG PO TABS: 80.0000 mg | ORAL_TABLET | Freq: Every day | ORAL | 3 refills | Status: DC

## 2018-09-17 MED ORDER — SPIRONOLACTONE 25 MG PO TABS: 12.5000 mg | ORAL_TABLET | Freq: Every day | ORAL | 2 refills | Status: DC

## 2018-09-17 NOTE — Telephone Encounter (Signed)
Pt called me to cancel our appointment this week as there was a death in the family and he is going out of town for that. Will f/u next week.   Marylouise Stacks, EMT -Paramedic  09/17/18

## 2018-09-17 NOTE — Progress Notes (Signed)
Paramedicine Encounter    Patient ID: Calvin [name], male    DOB: [birth year], 58 y.o.   MRN: [id number]   [name], NP as PCP - General (Geriatric Medicine) [name], MD as PCP - Cardiology (Cardiology) [name], MD as Consulting Physician (Endocrinology)  Patient Active Problem List   Diagnosis Date Noted  . Nausea and vomiting 03/10/2018  . AF (paroxysmal atrial fibrillation) ([place]) 03/10/2018  . Non-cardiac chest pain 03/09/2018  . Diabetic neuropathy ([place]) 09/26/2017  . MCI (mild cognitive impairment) 06/23/2017  . OSA on CPAP 05/10/2017  . Gastroesophageal reflux disease 05/10/2017  . Insomnia 05/10/2017  . Onychomycosis of multiple toenails with type 2 diabetes mellitus and peripheral neuropathy ([place]) 05/10/2017  . Chronic systolic heart failure ([place]) 11/22/2015  . Essential hypertension 11/22/2015  . DM (diabetes mellitus) ([place]) 11/20/2015  . HLD (hyperlipidemia) 11/20/2015  . Hx of adenomatous colonic polyps 05/19/2011    Current Outpatient Medications:  .  apixaban (ELIQUIS) 5 MG TABS tablet, Take 1 tablet (5 mg total) by mouth 2 (two) times daily., Disp: 180 tablet, Rfl: 1 .  carvedilol (COREG) 3.125 MG tablet, Take 6.25 mg by mouth 2 (two) times daily with a meal. , Disp: , Rfl:  .  donepezil (ARICEPT) 10 MG tablet, Take 1 tablet (10 mg total) by mouth at bedtime., Disp: 30 tablet, Rfl: 11 .  Insulin Syringe-Needle U-100 (INSULIN SYRINGE .3CC/29GX1/2") 29G X 1/2" 0.3 ML MISC, Inject 1 Syringe 3 (three) times daily as directed. Check blood sugar three times daily. Dx: E11.9, Disp: 100 each, Rfl: 3 .  lisinopril (PRINIVIL,ZESTRIL) 10 MG tablet, Take 1 tablet (10 mg total) by mouth at bedtime., Disp: 30 tablet, Rfl: 6 .  atorvastatin (LIPITOR) 80 MG tablet, Take 1 tablet (80 mg total) by mouth daily., Disp: 90 tablet, Rfl: 3 .  fluticasone (FLONASE) 50 MCG/ACT nasal spray, Place 1 spray into both nostrils 2 (two) times  daily as needed for allergies or rhinitis. (Patient not taking: Reported on 09/17/2018), Disp: 16 g, Rfl: 1 .  insulin NPH-regular Human (NOVOLIN 70/30) (70-30) 100 UNIT/ML injection, 90 units with breakfast, and 50 units with supper., Disp: 40 mL, Rfl: 3 .  nitroGLYCERIN (NITROSTAT) 0.4 MG SL tablet, Place 1 tablet (0.4 mg total) under the tongue every 5 (five) minutes as needed for chest pain. (Patient not taking: Reported on 09/14/2018), Disp: 30 tablet, Rfl: 1 .  spironolactone (ALDACTONE) 25 MG tablet, Take 0.5 tablets (12.5 mg total) by mouth daily., Disp: 45 tablet, Rfl: 2 No Known Allergies    Social History   Socioeconomic History  . Marital status: Single    Spouse name: Not on file  . Number of children: 0  . Years of education: Not on file  . Highest education level: Not on file  Occupational History  . Occupation: Disability  Social Needs  . Financial resource strain: Not on file  . Food insecurity:    Worry: Not on file    Inability: Not on file  . Transportation needs:    Medical: Not on file    Non-medical: Not on file  Tobacco Use  . Smoking status: Never Smoker  . Smokeless tobacco: Never Used  Substance and Sexual Activity  . Alcohol use: No  . Drug use: No  . Sexual activity: Yes    Birth control/protection: None  Lifestyle  . Physical activity:    Days per week: Not on file    Minutes per session: Not  on file  . Stress: Not on file  Relationships  . Social connections:    Talks on phone: Not on file    Gets together: Not on file    Attends religious service: Not on file    Active member of club or organization: Not on file    Attends meetings of clubs or organizations: Not on file    Relationship status: Not on file  . Intimate partner violence:    Fear of current or ex partner: Not on file    Emotionally abused: Not on file    Physically abused: Not on file    Forced sexual activity: Not on file  Other Topics Concern  . Not on file  Social  History Narrative   Social History      Diet?       Do you drink/eat things with caffeine? yes      Marital status?       single                             What year were you married?      Do you live in a house, apartment, assisted living, condo, trailer, etc.? yes      Is it one or more stories? One story      How many persons live in your home?      Do you have any pets in your home? (please list) none      Highest level of education completed? graduate      Current or past profession:      Do you exercise?            no                          Type & how often?      Advanced Directives      Do you have a living will? no      Do you have a DNR form?                                  If not, do you want to discuss one? no      Do you have signed POA/HPOA for forms? no      Functional Status      Do you have difficulty bathing or dressing yourself? no      Do you have difficulty preparing food or eating? no      Do you have difficulty managing your medications? no      Do you have difficulty managing your finances? no      Do you have difficulty affording your medications? no    Physical Exam      Future Appointments  Date Time Provider [place]  10/18/2018  9:15 AM Marzetta Board, DPM [facility]  10/25/2018 10:00 AM MC ECHO 1-BUZZ [facility]  10/25/2018 11:00 AM [facility] PA/NP [facility] None  11/13/2018 10:00 AM [name], MD [facility] None  12/26/2018  8:30 AM Cameron Sprang, MD [facility] None  02/04/2019 11:00 AM [name], NP [facility] None    BP 126/80   Pulse 100   Resp 15   SpO2 99%   Weight yesterday-? Last visit weight-164 @ clinic   Pt finally available for home visit, he reports he has no  complaints at this time. He was out of town for a death in the family the other week and when I arrived at our appoint time last week he was not home. ` Pt denies sob, no edema noted, no dizziness, no c/p. Although pt  states he does feels light headed sometimes.  meds verified and pill box refilled.  He was not able to get the spiro from [facility] due to costs. He does have a rxcard he is going to try at [facility] for that and the atorvastatin.  Pt has been weighing but he cant remember what it was past few days.  We tried calling his insurance however there was no service in the home and the call failed.  We made the call outside and got through to get him registered for the [facility] mail order pharmacy. Reviewed his meds.with [employer] all his meds except novolin and eliquis are $0 co pay.  Mcarthur Rossetti is going to fax the doctors to get the prescriptions sent over and then 7-10 days it will be sent out to pt.   Marylouise Stacks, [facility] Paramedic  09/17/18

## 2018-09-18 ENCOUNTER — Other Ambulatory Visit: Payer: Self-pay

## 2018-09-18 DIAGNOSIS — G3184 Mild cognitive impairment, so stated: Secondary | ICD-10-CM

## 2018-09-18 MED ORDER — DONEPEZIL HCL 10 MG PO TABS: 10.0000 mg | ORAL_TABLET | Freq: Every day | ORAL | 3 refills | Status: DC

## 2018-09-18 NOTE — Progress Notes (Signed)
Received request for Donepezil Rx from [facility] mail order pharmacy.  Rx is currently prescribed by Dr. [name].  LOV note states to continue.    Dr. Delice Lesch - are you taking over this Rx?  Will send if so.

## 2018-09-18 NOTE — Progress Notes (Signed)
That is fine, thanks 

## 2018-09-18 NOTE — Progress Notes (Signed)
Donepezil 35m #90 with 3 refills Sig = take 1 tab QHS  [facility] [place] [facility]

## 2018-09-19 ENCOUNTER — Other Ambulatory Visit (HOSPITAL_COMMUNITY): Payer: Self-pay

## 2018-09-24 ENCOUNTER — Telehealth (HOSPITAL_COMMUNITY): Payer: Self-pay | Admitting: Licensed Clinical Social Worker

## 2018-09-24 NOTE — Telephone Encounter (Signed)
CSW reached out to pt to check in regarding food and medication status at this time. Pt reports no concerns at this time.  CSW encouraged pt to reach out with any concerns and will continue to follow and assist as needed  Calvin [name], [facility] Worker [facility] [phone]

## 2018-09-25 ENCOUNTER — Other Ambulatory Visit (HOSPITAL_COMMUNITY): Payer: Self-pay

## 2018-09-25 MED ORDER — SPIRONOLACTONE 25 MG PO TABS: 12.5000 mg | ORAL_TABLET | Freq: Every day | ORAL | 2 refills | Status: DC

## 2018-09-25 MED ORDER — ATORVASTATIN CALCIUM 80 MG PO TABS: 80.0000 mg | ORAL_TABLET | Freq: Every day | ORAL | 3 refills | Status: DC

## 2018-09-25 NOTE — Progress Notes (Signed)
Paramedicine Encounter    Patient ID: Calvin [name], male    DOB: 05-Feb-1961, 58 y.o.   MRN: [id number]   [name], NP as PCP - General (Geriatric Medicine) [name], MD as PCP - Cardiology (Cardiology) [name], MD as Consulting Physician (Endocrinology)  Patient Active Problem List   Diagnosis Date Noted  . Nausea and vomiting 03/10/2018  . AF (paroxysmal atrial fibrillation) ([place]) 03/10/2018  . Non-cardiac chest pain 03/09/2018  . Diabetic neuropathy ([place]) 09/26/2017  . MCI (mild cognitive impairment) 06/23/2017  . OSA on CPAP 05/10/2017  . Gastroesophageal reflux disease 05/10/2017  . Insomnia 05/10/2017  . Onychomycosis of multiple toenails with type 2 diabetes mellitus and peripheral neuropathy ([place]) 05/10/2017  . Chronic systolic heart failure ([place]) 11/22/2015  . Essential hypertension 11/22/2015  . DM (diabetes mellitus) ([place]) 11/20/2015  . HLD (hyperlipidemia) 11/20/2015  . Hx of adenomatous colonic polyps 05/19/2011    Current Outpatient Medications:  .  apixaban (ELIQUIS) 5 MG TABS tablet, Take 1 tablet (5 mg total) by mouth 2 (two) times daily., Disp: 180 tablet, Rfl: 1 .  carvedilol (COREG) 3.125 MG tablet, Take 6.25 mg by mouth 2 (two) times daily with a meal. , Disp: , Rfl:  .  donepezil (ARICEPT) 10 MG tablet, Take 1 tablet (10 mg total) by mouth at bedtime., Disp: 90 tablet, Rfl: 3 .  insulin NPH-regular Human (NOVOLIN 70/30) (70-30) 100 UNIT/ML injection, 90 units with breakfast, and 50 units with supper., Disp: 40 mL, Rfl: 3 .  Insulin Syringe-Needle U-100 (INSULIN SYRINGE .3CC/29GX1/2") 29G X 1/2" 0.3 ML MISC, Inject 1 Syringe 3 (three) times daily as directed. Check blood sugar three times daily. Dx: E11.9, Disp: 100 each, Rfl: 3 .  lisinopril (PRINIVIL,ZESTRIL) 10 MG tablet, Take 1 tablet (10 mg total) by mouth at bedtime., Disp: 30 tablet, Rfl: 6 .  atorvastatin (LIPITOR) 80 MG tablet, Take 1 tablet (80 mg  total) by mouth daily., Disp: 90 tablet, Rfl: 3 .  fluticasone (FLONASE) 50 MCG/ACT nasal spray, Place 1 spray into both nostrils 2 (two) times daily as needed for allergies or rhinitis. (Patient not taking: Reported on 09/17/2018), Disp: 16 g, Rfl: 1 .  nitroGLYCERIN (NITROSTAT) 0.4 MG SL tablet, Place 1 tablet (0.4 mg total) under the tongue every 5 (five) minutes as needed for chest pain. (Patient not taking: Reported on 09/14/2018), Disp: 30 tablet, Rfl: 1 .  spironolactone (ALDACTONE) 25 MG tablet, Take 0.5 tablets (12.5 mg total) by mouth daily., Disp: 45 tablet, Rfl: 2 No Known Allergies    Social History   Socioeconomic History  . Marital status: Single    Spouse name: Not on file  . Number of children: 0  . Years of education: Not on file  . Highest education level: Not on file  Occupational History  . Occupation: Disability  Social Needs  . Financial resource strain: Not on file  . Food insecurity:    Worry: Not on file    Inability: Not on file  . Transportation needs:    Medical: Not on file    Non-medical: Not on file  Tobacco Use  . Smoking status: Never Smoker  . Smokeless tobacco: Never Used  Substance and Sexual Activity  . Alcohol use: No  . Drug use: No  . Sexual activity: Yes    Birth control/protection: None  Lifestyle  . Physical activity:    Days per week: Not on file    Minutes per session: Not  on file  . Stress: Not on file  Relationships  . Social connections:    Talks on phone: Not on file    Gets together: Not on file    Attends religious service: Not on file    Active member of club or organization: Not on file    Attends meetings of clubs or organizations: Not on file    Relationship status: Not on file  . Intimate partner violence:    Fear of current or ex partner: Not on file    Emotionally abused: Not on file    Physically abused: Not on file    Forced sexual activity: Not on file  Other Topics Concern  . Not on file  Social History  Narrative   Social History      Diet?       Do you drink/eat things with caffeine? yes      Marital status?       single                             What year were you married?      Do you live in a house, apartment, assisted living, condo, trailer, etc.? yes      Is it one or more stories? One story      How many persons live in your home?      Do you have any pets in your home? (please list) none      Highest level of education completed? graduate      Current or past profession:      Do you exercise?            no                          Type & how often?      Advanced Directives      Do you have a living will? no      Do you have a DNR form?                                  If not, do you want to discuss one? no      Do you have signed POA/HPOA for forms? no      Functional Status      Do you have difficulty bathing or dressing yourself? no      Do you have difficulty preparing food or eating? no      Do you have difficulty managing your medications? no      Do you have difficulty managing your finances? no      Do you have difficulty affording your medications? no    Physical Exam      Future Appointments  Date Time Provider [place]  10/18/2018  9:15 AM Marzetta Board, DPM [facility]  10/25/2018 10:00 AM MC ECHO 1-BUZZ [facility]  10/25/2018 11:00 AM [facility] PA/NP [facility] None  11/13/2018 10:00 AM [name], MD [facility] None  12/26/2018  8:30 AM Cameron Sprang, MD LBN[facility] None  02/04/2019 11:00 AM [name], NP PSC-[facility] None    BP 128/80   Pulse 92   Temp 98.1 F (36.7 C)   Resp 15   Wt 162 lb (73.5 kg)   SpO2 98%   BMI 27.81 kg/m   Weight  yesterday-162 Last visit weight-? Didn't weigh CBG PTA-242  Pt reports he is doing well, pt denies increased sob, no dizziness, no edema. meds verified and pill box refilled. Pt states he has been able to sleep better at night time, decreasing his sleepiness  during the day.  No missed doses of his meds.  His CBG are remaining elevated.   Marylouise Stacks, [facility] Paramedic  09/25/18

## 2018-09-27 ENCOUNTER — Other Ambulatory Visit (HOSPITAL_COMMUNITY): Payer: Self-pay | Admitting: Cardiology

## 2018-09-27 MED ORDER — CARVEDILOL 6.25 MG PO TABS: 6.2500 mg | ORAL_TABLET | Freq: Two times a day (BID) | ORAL | 3 refills | Status: DC

## 2018-09-27 MED ORDER — LISINOPRIL 10 MG PO TABS: 10.0000 mg | ORAL_TABLET | Freq: Every day | ORAL | 3 refills | Status: DC

## 2018-10-02 ENCOUNTER — Other Ambulatory Visit (HOSPITAL_COMMUNITY): Payer: Self-pay

## 2018-10-02 NOTE — Progress Notes (Signed)
Paramedicine Encounter    Patient ID: Calvin [name], male    DOB: Aug 21, 1960, 58 y.o.   MRN: [id number]   [name], NP as PCP - General (Geriatric Medicine) [name], MD as PCP - Cardiology (Cardiology) [name], MD as Consulting Physician (Endocrinology)  Patient Active Problem List   Diagnosis Date Noted  . Nausea and vomiting 03/10/2018  . AF (paroxysmal atrial fibrillation) ([place]) 03/10/2018  . Non-cardiac chest pain 03/09/2018  . Diabetic neuropathy ([place]) 09/26/2017  . MCI (mild cognitive impairment) 06/23/2017  . OSA on CPAP 05/10/2017  . Gastroesophageal reflux disease 05/10/2017  . Insomnia 05/10/2017  . Onychomycosis of multiple toenails with type 2 diabetes mellitus and peripheral neuropathy ([place]) 05/10/2017  . Chronic systolic heart failure ([place]) 11/22/2015  . Essential hypertension 11/22/2015  . DM (diabetes mellitus) ([place]) 11/20/2015  . HLD (hyperlipidemia) 11/20/2015  . Hx of adenomatous colonic polyps 05/19/2011    Current Outpatient Medications:  .  apixaban (ELIQUIS) 5 MG TABS tablet, Take 1 tablet (5 mg total) by mouth 2 (two) times daily., Disp: 180 tablet, Rfl: 1 .  atorvastatin (LIPITOR) 80 MG tablet, Take 1 tablet (80 mg total) by mouth daily., Disp: 90 tablet, Rfl: 3 .  carvedilol (COREG) 6.25 MG tablet, Take 1 tablet (6.25 mg total) by mouth 2 (two) times daily with a meal., Disp: 180 tablet, Rfl: 3 .  donepezil (ARICEPT) 10 MG tablet, Take 1 tablet (10 mg total) by mouth at bedtime., Disp: 90 tablet, Rfl: 3 .  insulin NPH-regular Human (NOVOLIN 70/30) (70-30) 100 UNIT/ML injection, 90 units with breakfast, and 50 units with supper., Disp: 40 mL, Rfl: 3 .  Insulin Syringe-Needle U-100 (INSULIN SYRINGE .3CC/29GX1/2") 29G X 1/2" 0.3 ML MISC, Inject 1 Syringe 3 (three) times daily as directed. Check blood sugar three times daily. Dx: E11.9, Disp: 100 each, Rfl: 3 .  lisinopril (PRINIVIL,ZESTRIL) 10 MG tablet,  Take 1 tablet (10 mg total) by mouth at bedtime., Disp: 90 tablet, Rfl: 3 .  spironolactone (ALDACTONE) 25 MG tablet, Take 0.5 tablets (12.5 mg total) by mouth daily., Disp: 45 tablet, Rfl: 2 .  fluticasone (FLONASE) 50 MCG/ACT nasal spray, Place 1 spray into both nostrils 2 (two) times daily as needed for allergies or rhinitis. (Patient not taking: Reported on 09/17/2018), Disp: 16 g, Rfl: 1 .  nitroGLYCERIN (NITROSTAT) 0.4 MG SL tablet, Place 1 tablet (0.4 mg total) under the tongue every 5 (five) minutes as needed for chest pain. (Patient not taking: Reported on 09/14/2018), Disp: 30 tablet, Rfl: 1 No Known Allergies    Social History   Socioeconomic History  . Marital status: Single    Spouse name: Not on file  . Number of children: 0  . Years of education: Not on file  . Highest education level: Not on file  Occupational History  . Occupation: Disability  Social Needs  . Financial resource strain: Not on file  . Food insecurity:    Worry: Not on file    Inability: Not on file  . Transportation needs:    Medical: Not on file    Non-medical: Not on file  Tobacco Use  . Smoking status: Never Smoker  . Smokeless tobacco: Never Used  Substance and Sexual Activity  . Alcohol use: No  . Drug use: No  . Sexual activity: Yes    Birth control/protection: None  Lifestyle  . Physical activity:    Days per week: Not on file    Minutes  per session: Not on file  . Stress: Not on file  Relationships  . Social connections:    Talks on phone: Not on file    Gets together: Not on file    Attends religious service: Not on file    Active member of club or organization: Not on file    Attends meetings of clubs or organizations: Not on file    Relationship status: Not on file  . Intimate partner violence:    Fear of current or ex partner: Not on file    Emotionally abused: Not on file    Physically abused: Not on file    Forced sexual activity: Not on file  Other Topics Concern  .  Not on file  Social History Narrative   Social History      Diet?       Do you drink/eat things with caffeine? yes      Marital status?       single                             What year were you married?      Do you live in a house, apartment, assisted living, condo, trailer, etc.? yes      Is it one or more stories? One story      How many persons live in your home?      Do you have any pets in your home? (please list) none      Highest level of education completed? graduate      Current or past profession:      Do you exercise?            no                          Type & how often?      Advanced Directives      Do you have a living will? no      Do you have a DNR form?                                  If not, do you want to discuss one? no      Do you have signed POA/HPOA for forms? no      Functional Status      Do you have difficulty bathing or dressing yourself? no      Do you have difficulty preparing food or eating? no      Do you have difficulty managing your medications? no      Do you have difficulty managing your finances? no      Do you have difficulty affording your medications? no    Physical Exam      Future Appointments  Date Time Provider [place]  10/18/2018  9:15 AM Marzetta Board, DPM [facility]  10/25/2018 10:00 AM MC ECHO 1-BUZZ [facility]  10/25/2018 11:00 AM MC-[facility] PA/NP [facility] None  11/13/2018 10:00 AM [name], MD [facility] None  12/26/2018  8:30 AM Cameron Sprang, MD LBN[facility] None  02/04/2019 11:00 AM [name], NP [facility] None    BP (!) 148/78   Pulse (!) 114   Resp 16   Wt 164 lb (74.4 kg)   SpO2 98%   BMI 28.15 kg/m   Weight yesterday-? Last  visit weight-164  Pt reports doing ok, he has been dealing with some personal family issues that has gotten him stressed out. His b/p and HR is elevated-he just got in the apt when I arrived and had been out and about today.  No missed  doses of his meds. In previous visits his b/p have been great. He reports having a headache lately-contributing to that for stress. No fever, no sob. I have advised him to limit visitation with anyone due to COVID-19. He does not have much of an appetite. He usually eats about once a day. He does eat a small supper to take his insulin but not much. His CBG is still running elevated.  He tried calling for extra help but they advised he makes too much and does not qualify. Will f/u next week.   Marylouise Stacks, [facility] Paramedic  10/02/18

## 2018-10-03 ENCOUNTER — Other Ambulatory Visit (HOSPITAL_COMMUNITY): Payer: Self-pay | Admitting: *Deleted

## 2018-10-03 ENCOUNTER — Telehealth (HOSPITAL_COMMUNITY): Payer: Self-pay | Admitting: *Deleted

## 2018-10-03 DIAGNOSIS — I480 Paroxysmal atrial fibrillation: Secondary | ICD-10-CM

## 2018-10-03 MED ORDER — APIXABAN 5 MG PO TABS: 5.0000 mg | ORAL_TABLET | Freq: Two times a day (BID) | ORAL | 3 refills | Status: DC

## 2018-10-03 NOTE — Telephone Encounter (Signed)
Received completed and signed [employer] pt assist form from pt.  MD section completed and signed by Dr Haroldine Laws and faxed to [facility]

## 2018-10-09 ENCOUNTER — Telehealth (HOSPITAL_COMMUNITY): Payer: Self-pay | Admitting: Licensed Clinical Social Worker

## 2018-10-09 NOTE — Telephone Encounter (Signed)
CSW received notification that pt is not approved for [employer] assistance with Eliquis at this time.  CSW called foundation who confirms pt will need to have paid $229.22 on copays for his medications before he qualifies.  Confirmed with pt which pharmacies he uses and called them to inquire about current copay history for 2020- pt has only spent $4 at [facility] and has not been using insurance to purchase his insulin as it is more expensive through insurance at this time.    CSW informed pt that he does not currently qualify for assistance for Eliquis until he reaches their copay requirement- informed him to let us know when that requirement has been met and we would help send in verification to [employer]- pt expressed understanding  CSW will continue to follow and assist as needed.  Jorge Ny, LCSW Clinical Social Worker [facility] Desk#: [phone] Cell#: [phone]

## 2018-10-10 ENCOUNTER — Other Ambulatory Visit (HOSPITAL_COMMUNITY): Payer: Self-pay

## 2018-10-10 NOTE — Telephone Encounter (Signed)
Called pt to follow up with Medicaid application. He states he was denied. Repatha and Jardiance unfortunately remain cost prohibitive with his [employer].

## 2018-10-10 NOTE — Progress Notes (Signed)
Paramedicine Encounter    Patient ID: Calvin [name], male    DOB: 1961/01/01, 58 y.o.   MRN: [id number]   [name], NP as PCP - General (Geriatric Medicine) [name], MD as PCP - Cardiology (Cardiology) [name], [name], MD as PCP - Advanced Heart Failure (Cardiology) [name], MD as Consulting Physician (Endocrinology)  Patient Active Problem List   Diagnosis Date Noted  . Nausea and vomiting 03/10/2018  . AF (paroxysmal atrial fibrillation) ([place]) 03/10/2018  . Non-cardiac chest pain 03/09/2018  . Diabetic neuropathy ([place]) 09/26/2017  . MCI (mild cognitive impairment) 06/23/2017  . OSA on CPAP 05/10/2017  . Gastroesophageal reflux disease 05/10/2017  . Insomnia 05/10/2017  . Onychomycosis of multiple toenails with type 2 diabetes mellitus and peripheral neuropathy ([place]) 05/10/2017  . Chronic systolic heart failure ([place]) 11/22/2015  . Essential hypertension 11/22/2015  . DM (diabetes mellitus) ([place]) 11/20/2015  . HLD (hyperlipidemia) 11/20/2015  . Hx of adenomatous colonic polyps 05/19/2011    Current Outpatient Medications:  .  apixaban (ELIQUIS) 5 MG TABS tablet, Take 1 tablet (5 mg total) by mouth 2 (two) times daily., Disp: 180 tablet, Rfl: 3 .  atorvastatin (LIPITOR) 80 MG tablet, Take 1 tablet (80 mg total) by mouth daily., Disp: 90 tablet, Rfl: 3 .  carvedilol (COREG) 6.25 MG tablet, Take 1 tablet (6.25 mg total) by mouth 2 (two) times daily with a meal., Disp: 180 tablet, Rfl: 3 .  donepezil (ARICEPT) 10 MG tablet, Take 1 tablet (10 mg total) by mouth at bedtime., Disp: 90 tablet, Rfl: 3 .  insulin NPH-regular Human (NOVOLIN 70/30) (70-30) 100 UNIT/ML injection, 90 units with breakfast, and 50 units with supper., Disp: 40 mL, Rfl: 3 .  Insulin Syringe-Needle U-100 (INSULIN SYRINGE .3CC/29GX1/2") 29G X 1/2" 0.3 ML MISC, Inject 1 Syringe 3 (three) times daily as directed. Check blood sugar three times daily. Dx: E11.9,  Disp: 100 each, Rfl: 3 .  lisinopril (PRINIVIL,ZESTRIL) 10 MG tablet, Take 1 tablet (10 mg total) by mouth at bedtime., Disp: 90 tablet, Rfl: 3 .  spironolactone (ALDACTONE) 25 MG tablet, Take 0.5 tablets (12.5 mg total) by mouth daily., Disp: 45 tablet, Rfl: 2 .  fluticasone (FLONASE) 50 MCG/ACT nasal spray, Place 1 spray into both nostrils 2 (two) times daily as needed for allergies or rhinitis. (Patient not taking: Reported on 09/17/2018), Disp: 16 g, Rfl: 1 .  nitroGLYCERIN (NITROSTAT) 0.4 MG SL tablet, Place 1 tablet (0.4 mg total) under the tongue every 5 (five) minutes as needed for chest pain. (Patient not taking: Reported on 09/14/2018), Disp: 30 tablet, Rfl: 1 No Known Allergies    Social History   Socioeconomic History  . Marital status: Single    Spouse name: Not on file  . Number of children: 0  . Years of education: Not on file  . Highest education level: Not on file  Occupational History  . Occupation: Disability  Social Needs  . Financial resource strain: Not on file  . Food insecurity:    Worry: Not on file    Inability: Not on file  . Transportation needs:    Medical: Not on file    Non-medical: Not on file  Tobacco Use  . Smoking status: Never Smoker  . Smokeless tobacco: Never Used  Substance and Sexual Activity  . Alcohol use: No  . Drug use: No  . Sexual activity: Yes    Birth control/protection: None  Lifestyle  . Physical activity:  Days per week: Not on file    Minutes per session: Not on file  . Stress: Not on file  Relationships  . Social connections:    Talks on phone: Not on file    Gets together: Not on file    Attends religious service: Not on file    Active member of club or organization: Not on file    Attends meetings of clubs or organizations: Not on file    Relationship status: Not on file  . Intimate partner violence:    Fear of current or ex partner: Not on file    Emotionally abused: Not on file    Physically abused: Not on  file    Forced sexual activity: Not on file  Other Topics Concern  . Not on file  Social History Narrative   Social History      Diet?       Do you drink/eat things with caffeine? yes      Marital status?       single                             What year were you married?      Do you live in a house, apartment, assisted living, condo, trailer, etc.? yes      Is it one or more stories? One story      How many persons live in your home?      Do you have any pets in your home? (please list) none      Highest level of education completed? graduate      Current or past profession:      Do you exercise?            no                          Type & how often?      Advanced Directives      Do you have a living will? no      Do you have a DNR form?                                  If not, do you want to discuss one? no      Do you have signed POA/HPOA for forms? no      Functional Status      Do you have difficulty bathing or dressing yourself? no      Do you have difficulty preparing food or eating? no      Do you have difficulty managing your medications? no      Do you have difficulty managing your finances? no      Do you have difficulty affording your medications? no    Physical Exam      Future Appointments  Date Time Provider [place]  10/18/2018  9:15 AM Marzetta Board, DPM [facility]  10/25/2018 10:00 AM MC ECHO 1-BUZZ [facility]  10/25/2018 11:00 AM MC-[facility] PA/NP MC-[facility] None  11/13/2018 10:00 AM [name], MD [facility] None  12/26/2018  8:30 AM Cameron Sprang, MD LBN[facility] None  02/04/2019 11:00 AM [name], NP [facility] None    BP 140/72   Pulse 90   Temp 99 F (37.2 C)   Resp 16   SpO2 99%  Weight yesterday-164 Last visit weight-164 CBG PTA-300  Pt reports he has had a lot of drama lately and has been stressed out. Pt has c/o h/a and not feeling well. Pt states he has been getting dizzy and light  headed more often.  no slurring speech, no facial droop, no weakness, no arm drift. He also reports a "pulling" feeling from his left shoulder down his left arm. I advised this could be heart related or poss stroke symptoms. He said his doc told him he may have feelings like that since his last stroke but I still have high concern about this. right now he is not wanting to pursue further care for it. 12EKG done and no changes from EKG a couple months ago.  I advised him if he changed his mind to call 911 or go to hosp by car. His CBG are elevated-poor appetite so I dont think its much of what he is eating. He reports taking 90U in am and 60U in pm.  eliquis-in thru wed pm to mon am  I sent Dr Loanne Drilling message regarding elevated CBG's. Per Dr. Loanne Drilling he is to increase his insulin to 110U in morn and 70U at supper and will recheck him next week.  He has been getting more sob and feeling tired.  I will have to get him samples of eliquis right now as he cannot afford the co-pay. He is going to call [employer] again and to see how much it is through them.   Marylouise Stacks, [facility] Paramedic  10/10/18

## 2018-10-11 ENCOUNTER — Telehealth: Payer: Self-pay | Admitting: Cardiovascular Disease

## 2018-10-11 NOTE — Telephone Encounter (Signed)
Looks like the pt has applied for pt asst with [employer] and was denied. See phone notes from 10/03/2018 and 10/09/2018.  Please reach out to Kevan Rosebush, RN at our CHF clinic as the pt will need to meet his deductible or be switched to a lower costing medication as the office cannot provide him his Eliquis. Thanks.

## 2018-10-11 NOTE — Telephone Encounter (Signed)
New message      Patient calling the office for samples of medication:   1.  What medication and dosage are you requesting samples for? Eliquis 5 mg   2.  Are you currently out of this medication? Yes

## 2018-10-11 NOTE — Telephone Encounter (Signed)
Pt is requesting samples of Eliquis. Can you address and advise what needs to be done? Thanks

## 2018-10-12 ENCOUNTER — Telehealth (HOSPITAL_COMMUNITY): Payer: Self-pay | Admitting: Cardiology

## 2018-10-12 NOTE — Telephone Encounter (Signed)
Tier exception completed with [employer] for eliquis [phone] so patient can receive medication with lower co pay Ref# [id number]  Turn around time is 24-72 hours for determination

## 2018-10-12 NOTE — Telephone Encounter (Signed)
Received fax from [facility], tier exception for Eliquis was denied b/c "since there isn't a brand name drug for your condition in a lower cost-sharting tier, a tier exception is not permitted."  Per chart pt was also denied assistance from [employer], currently we do not have any Eliquis samples.  Will send back to [place] and [place]

## 2018-10-12 NOTE — Telephone Encounter (Signed)
See phone note 4/10

## 2018-10-15 ENCOUNTER — Telehealth: Payer: Self-pay | Admitting: Cardiovascular Disease

## 2018-10-15 ENCOUNTER — Telehealth: Payer: Self-pay

## 2018-10-15 MED ORDER — WARFARIN SODIUM 5 MG PO TABS: 5.0000 mg | ORAL_TABLET | Freq: Every day | ORAL | 1 refills | Status: DC

## 2018-10-15 NOTE — Telephone Encounter (Signed)
Pt was switched to coumadin Ok to file

## 2018-10-15 NOTE — Telephone Encounter (Signed)
Called pt to inform to increase insulin per [name] orders. Scheduled to be seen 10/16/18 for face to face visit.

## 2018-10-15 NOTE — Telephone Encounter (Signed)
-----   Message from [name], MD sent at 10/10/2018  3:20 PM EDT ----- Thank you.  Please increase the insulin to 110 units with breakfast, and 70 units with supper.  Please sched f/u visit next week.   ----- Message ----- From: Marylouise Stacks, EMT Sent: 10/10/2018  10:22 AM EDT To: [name], MD  Good morning,   I am here seeing Mr. Calvin [name], his CBG have been consistently elevated running over 240 and the past few days it has been 300. He has been taking 90U insulin in the am and he thought his pm dose of insulin was 60U, instead of 50U.   Thanks,  Marylouise Stacks, [place] 10/10/18

## 2018-10-15 NOTE — Telephone Encounter (Signed)
The pt is advised that we are limited on our supply of Eliquis samples in our office.  He has [employer] but copays are prohibitive.  He has been denied a tier exception as Eliquis is a brand name drug that is already at a tier 3 and cannot be lowered any further per Medicare part D rules. He was denied PT Asst with [employer] because he has insurance coverage and owes $230 on his deductible.  He states that he cannot afford Eliquis and asked if there is a lower costing medication he can take instead.  I briefly discussed Coumadin with the pt and he states that he is not opposed to taking it.  He is aware that I am forwarding this message to Dr Angelena Form and his nurse for advisement to the pt. FYI He states that he took his last dose of Eliquis this morning and is currently out.

## 2018-10-15 NOTE — Telephone Encounter (Signed)
Jeani Hawking, LPN could you take a look at this matter for me and advise because our samples are limited as well. thanks

## 2018-10-15 NOTE — Telephone Encounter (Signed)
Returned call to pt, he is completely out of Eliquis. Will start warfarin 15m daily, first dose today. Scheduled new Coumadin visit this Friday at 10:30am.

## 2018-10-15 NOTE — Telephone Encounter (Signed)
Tammy Sours, LCSW called again asking if we had any samples of Eliquis for the pt. Her mobile number is [phone]. Please get in touch with her if the office has any samples for the patient because she is out.

## 2018-10-15 NOTE — Telephone Encounter (Signed)
Thanks Dr Lonna Cobb.  Calvin [name] and [place], Please let me know if there is anything else I can do to help.

## 2018-10-15 NOTE — Telephone Encounter (Signed)
He can be changed to coumadin from Eliquis due to cost. I will include Megan Supple and Tana Coast on this as well.   Thanks, chris

## 2018-10-16 ENCOUNTER — Ambulatory Visit (INDEPENDENT_AMBULATORY_CARE_PROVIDER_SITE_OTHER): Payer: Medicare HMO | Admitting: Endocrinology

## 2018-10-16 ENCOUNTER — Encounter: Payer: Self-pay | Admitting: Endocrinology

## 2018-10-16 ENCOUNTER — Other Ambulatory Visit: Payer: Self-pay

## 2018-10-16 VITALS — BP 130/80 | HR 101 | Ht 64.0 in | Wt 163.2 lb

## 2018-10-16 DIAGNOSIS — E1140 Type 2 diabetes mellitus with diabetic neuropathy, unspecified: Secondary | ICD-10-CM

## 2018-10-16 DIAGNOSIS — Z794 Long term (current) use of insulin: Secondary | ICD-10-CM | POA: Diagnosis not present

## 2018-10-16 MED ORDER — INSULIN NPH ISOPHANE & REGULAR (70-30) 100 UNIT/ML ~~LOC~~ SUSP: SUBCUTANEOUS | 3 refills | Status: DC

## 2018-10-16 NOTE — Patient Instructions (Addendum)
check your blood sugar twice a day.  vary the time of day when you check, between before the 3 meals, and at bedtime.  also check if you have symptoms of your blood sugar being too high or too low.  please keep a record of the readings and bring it to your next appointment here (or you can bring the meter itself).  You can write it on any piece of paper.  please call us sooner if your blood sugar goes below 70, or if you have a lot of readings over 200. Please increase the insulin to 130 units with breakfast, and 90 units with supper.     Please come back for a follow-up appointment in 1 month.

## 2018-10-16 NOTE — Progress Notes (Signed)
Subjective:    Patient ID: Calvin [name], male    DOB: 17-Nov-1960, 58 y.o.   MRN: [id number]  HPI Pt returns for f/u of diabetes mellitus: DM type: Insulin-requiring type 2. Dx'ed: 2563 Complications: polyneuropathy, CAD, renal insuff, and DR.   Therapy: insulin since soon after dx.  DKA: never Severe hypoglycemia: never.   Pancreatitis: never Pancreatic imaging: normal on 2003 CT. Other: he takes human insulin, due to cost; due to noncompliance, he is not a candidate for multiple daily injections.   Interval history: pt states he feels well in general.  Please increase the insulin to 115 units with breakfast, and 80 units with supper.  The increase has not helped.  no cbg record, but states cbg's vary from 240-260.  There is no trend throughout the day.  Pt says he never miss the insulin.   Past Medical History:  Diagnosis Date   Adenoidal hypertrophy    Adenomatous colon polyps 2012   Arthritis    Diabetes mellitus    GERD (gastroesophageal reflux disease)    Heart attack ([place])    While living in [place]   Hx of adenomatous colonic polyps 08/18/2017   Hyperlipidemia    Hypertension    Mild CAD    a. cath in 08/2017 showing mild nonobstructive CAD with scattered 20-30% stenosis.    Nonischemic cardiomyopathy ([place])    a. EF 20-25% by echo in 09/2017 with cath showing mild CAD. b.  Last echo 12/2017 EF 35-40%, grade 2 DD.   Obesity    PAF (paroxysmal atrial fibrillation) ([place])    Sleep apnea    cpap    Past Surgical History:  Procedure Laterality Date   COLONOSCOPY  05/13/11   9 adenomas   FOREARM SURGERY     MUSCLE BIOPSY     RIGHT/LEFT HEART CATH AND CORONARY ANGIOGRAPHY N/A 08/25/2017   Procedure: RIGHT/LEFT HEART CATH AND CORONARY ANGIOGRAPHY;  Surgeon: [name], MD;  Location: [facility];  Service: Cardiovascular;  Laterality: N/A;    Social History   Socioeconomic History   Marital status: Single    Spouse name: Not on  file   Number of children: 0   Years of education: Not on file   Highest education level: Not on file  Occupational History   Occupation: Disability  Social Needs   Financial resource strain: Not on file   Food insecurity:    Worry: Not on file    Inability: Not on file   Transportation needs:    Medical: Not on file    Non-medical: Not on file  Tobacco Use   Smoking status: Never Smoker   Smokeless tobacco: Never Used  Substance and Sexual Activity   Alcohol use: No   Drug use: No   Sexual activity: Yes    Birth control/protection: None  Lifestyle   Physical activity:    Days per week: Not on file    Minutes per session: Not on file   Stress: Not on file  Relationships   Social connections:    Talks on phone: Not on file    Gets together: Not on file    Attends religious service: Not on file    Active member of club or organization: Not on file    Attends meetings of clubs or organizations: Not on file    Relationship status: Not on file   Intimate partner violence:    Fear of current or ex partner: Not on file  Emotionally abused: Not on file    Physically abused: Not on file    Forced sexual activity: Not on file  Other Topics Concern   Not on file  Social History Narrative   Social History      Diet?       Do you drink/eat things with caffeine? yes      Marital status?       single                             What year were you married?      Do you live in a house, apartment, assisted living, condo, trailer, etc.? yes      Is it one or more stories? One story      How many persons live in your home?      Do you have any pets in your home? (please list) none      Highest level of education completed? graduate      Current or past profession:      Do you exercise?            no                          Type & how often?      Advanced Directives      Do you have a living will? no      Do you have a DNR form?                                   If not, do you want to discuss one? no      Do you have signed POA/HPOA for forms? no      Functional Status      Do you have difficulty bathing or dressing yourself? no      Do you have difficulty preparing food or eating? no      Do you have difficulty managing your medications? no      Do you have difficulty managing your finances? no      Do you have difficulty affording your medications? no    Current Outpatient Medications on File Prior to Visit  Medication Sig Dispense Refill   atorvastatin (LIPITOR) 80 MG tablet Take 1 tablet (80 mg total) by mouth daily. 90 tablet 3   carvedilol (COREG) 6.25 MG tablet Take 1 tablet (6.25 mg total) by mouth 2 (two) times daily with a meal. 180 tablet 3   donepezil (ARICEPT) 10 MG tablet Take 1 tablet (10 mg total) by mouth at bedtime. 90 tablet 3   fluticasone (FLONASE) 50 MCG/ACT nasal spray Place 1 spray into both nostrils 2 (two) times daily as needed for allergies or rhinitis. (Patient not taking: Reported on 10/17/2018) 16 g 1   Insulin Syringe-Needle U-100 (INSULIN SYRINGE .3CC/29GX1/2") 29G X 1/2" 0.3 ML MISC Inject 1 Syringe 3 (three) times daily as directed. Check blood sugar three times daily. Dx: E11.9 100 each 3   lisinopril (PRINIVIL,ZESTRIL) 10 MG tablet Take 1 tablet (10 mg total) by mouth at bedtime. 90 tablet 3   nitroGLYCERIN (NITROSTAT) 0.4 MG SL tablet Place 1 tablet (0.4 mg total) under the tongue every 5 (five) minutes as needed for chest pain. (Patient not taking: Reported on 10/17/2018) 30 tablet 1   spironolactone (ALDACTONE) 25  MG tablet Take 0.5 tablets (12.5 mg total) by mouth daily. 45 tablet 2   warfarin (COUMADIN) 5 MG tablet Take 1 tablet (5 mg total) by mouth daily. 30 tablet 1   No current facility-administered medications on file prior to visit.     No Known Allergies  Family History  Problem Relation Age of Onset   Heart disease Mother    Heart attack Mother 59   Hypertension  Mother    Hyperlipidemia Mother    Diabetes Mother    Heart disease Father    Heart attack Father 11   Hypertension Father    Hyperlipidemia Father    Diabetes Father    Heart attack Sister 56   Colon cancer Neg Hx    Stomach cancer Neg Hx    Esophageal cancer Neg Hx    Rectal cancer Neg Hx     BP 130/80 (BP Location: Left Arm, Patient Position: Sitting, Cuff Size: Normal)    Pulse (!) 101    Ht 5' 4"  (1.626 m)    Wt 163 lb 3.2 oz (74 kg)    SpO2 97%    BMI 28.01 kg/m   Review of Systems He denies hypoglycemia    Objective:   Physical Exam VITAL SIGNS:  See vs page GENERAL: no distress Pulses: dorsalis pedis intact bilat.   MSK: no deformity of the feet CV: no leg edema Skin:  no ulcer on the feet.  normal color and temp on the feet.  Neuro: sensation is intact to touch on the feet.  Ext: There is bilateral onychomycosis of the toenails.     Lab Results  Component Value Date   HGBA1C 10.9 (A) 09/12/2018   Lab Results  Component Value Date   CREATININE 1.29 (H) 09/10/2018   BUN 13 09/10/2018   NA 135 09/10/2018   K 4.2 09/10/2018   CL 103 09/10/2018   CO2 26 09/10/2018       Assessment & Plan:  Insulin-requiring type 2 DM, with CAD: worse. Renal insuff: in this setting, he needs mostly AM insulin. Pattern of cbg's supports this  Patient Instructions  check your blood sugar twice a day.  vary the time of day when you check, between before the 3 meals, and at bedtime.  also check if you have symptoms of your blood sugar being too high or too low.  please keep a record of the readings and bring it to your next appointment here (or you can bring the meter itself).  You can write it on any piece of paper.  please call us sooner if your blood sugar goes below 70, or if you have a lot of readings over 200. Please increase the insulin to 130 units with breakfast, and 90 units with supper.     Please come back for a follow-up appointment in 1 month.

## 2018-10-17 ENCOUNTER — Other Ambulatory Visit (HOSPITAL_COMMUNITY): Payer: Self-pay

## 2018-10-17 NOTE — Progress Notes (Addendum)
Paramedicine Encounter    Patient ID: Calvin [name], male    DOB: [birth year], 58 y.o.   MRN: [id number]   [name], NP as PCP - General (Geriatric Medicine) [name], MD as PCP - Cardiology (Cardiology) [name], [name], MD as PCP - Advanced Heart Failure (Cardiology) [name], MD as Consulting Physician (Endocrinology)  Patient Active Problem List   Diagnosis Date Noted  . Nausea and vomiting 03/10/2018  . AF (paroxysmal atrial fibrillation) ([place]) 03/10/2018  . Non-cardiac chest pain 03/09/2018  . Diabetic neuropathy ([place]) 09/26/2017  . MCI (mild cognitive impairment) 06/23/2017  . OSA on CPAP 05/10/2017  . Gastroesophageal reflux disease 05/10/2017  . Insomnia 05/10/2017  . Onychomycosis of multiple toenails with type 2 diabetes mellitus and peripheral neuropathy ([place]) 05/10/2017  . Chronic systolic heart failure ([place]) 11/22/2015  . Essential hypertension 11/22/2015  . DM (diabetes mellitus) ([place]) 11/20/2015  . HLD (hyperlipidemia) 11/20/2015  . Hx of adenomatous colonic polyps 05/19/2011    Current Outpatient Medications:  .  atorvastatin (LIPITOR) 80 MG tablet, Take 1 tablet (80 mg total) by mouth daily., Disp: 90 tablet, Rfl: 3 .  carvedilol (COREG) 6.25 MG tablet, Take 1 tablet (6.25 mg total) by mouth 2 (two) times daily with a meal., Disp: 180 tablet, Rfl: 3 .  donepezil (ARICEPT) 10 MG tablet, Take 1 tablet (10 mg total) by mouth at bedtime., Disp: 90 tablet, Rfl: 3 .  insulin NPH-regular Human (NOVOLIN 70/30) (70-30) 100 UNIT/ML injection, 130 units with breakfast, and 90 units with supper., Disp: 40 mL, Rfl: 3 .  Insulin Syringe-Needle U-100 (INSULIN SYRINGE .3CC/29GX1/2") 29G X 1/2" 0.3 ML MISC, Inject 1 Syringe 3 (three) times daily as directed. Check blood sugar three times daily. Dx: E11.9, Disp: 100 each, Rfl: 3 .  lisinopril (PRINIVIL,ZESTRIL) 10 MG tablet, Take 1 tablet (10 mg total) by mouth at bedtime.,  Disp: 90 tablet, Rfl: 3 .  spironolactone (ALDACTONE) 25 MG tablet, Take 0.5 tablets (12.5 mg total) by mouth daily., Disp: 45 tablet, Rfl: 2 .  warfarin (COUMADIN) 5 MG tablet, Take 1 tablet (5 mg total) by mouth daily., Disp: 30 tablet, Rfl: 1 .  fluticasone (FLONASE) 50 MCG/ACT nasal spray, Place 1 spray into both nostrils 2 (two) times daily as needed for allergies or rhinitis. (Patient not taking: Reported on 10/17/2018), Disp: 16 g, Rfl: 1 .  nitroGLYCERIN (NITROSTAT) 0.4 MG SL tablet, Place 1 tablet (0.4 mg total) under the tongue every 5 (five) minutes as needed for chest pain. (Patient not taking: Reported on 10/17/2018), Disp: 30 tablet, Rfl: 1 No Known Allergies    Social History   Socioeconomic History  . Marital status: Single    Spouse name: Not on file  . Number of children: 0  . Years of education: Not on file  . Highest education level: Not on file  Occupational History  . Occupation: Disability  Social Needs  . Financial resource strain: Not on file  . Food insecurity:    Worry: Not on file    Inability: Not on file  . Transportation needs:    Medical: Not on file    Non-medical: Not on file  Tobacco Use  . Smoking status: Never Smoker  . Smokeless tobacco: Never Used  Substance and Sexual Activity  . Alcohol use: No  . Drug use: No  . Sexual activity: Yes    Birth control/protection: None  Lifestyle  . Physical activity:    Days per week:  Not on file    Minutes per session: Not on file  . Stress: Not on file  Relationships  . Social connections:    Talks on phone: Not on file    Gets together: Not on file    Attends religious service: Not on file    Active member of club or organization: Not on file    Attends meetings of clubs or organizations: Not on file    Relationship status: Not on file  . Intimate partner violence:    Fear of current or ex partner: Not on file    Emotionally abused: Not on file    Physically abused: Not on file    Forced  sexual activity: Not on file  Other Topics Concern  . Not on file  Social History Narrative   Social History      Diet?       Do you drink/eat things with caffeine? yes      Marital status?       single                             What year were you married?      Do you live in a house, apartment, assisted living, condo, trailer, etc.? yes      Is it one or more stories? One story      How many persons live in your home?      Do you have any pets in your home? (please list) none      Highest level of education completed? graduate      Current or past profession:      Do you exercise?            no                          Type & how often?      Advanced Directives      Do you have a living will? no      Do you have a DNR form?                                  If not, do you want to discuss one? no      Do you have signed POA/HPOA for forms? no      Functional Status      Do you have difficulty bathing or dressing yourself? no      Do you have difficulty preparing food or eating? no      Do you have difficulty managing your medications? no      Do you have difficulty managing your finances? no      Do you have difficulty affording your medications? no    Physical Exam      Future Appointments  Date Time Provider [place]  10/19/2018 10:30 AM [facility]  10/25/2018 10:00 AM [facility] ECHO 1-[facility]  10/25/2018 11:00 AM [facility] PA/NP [facility] None  11/13/2018 10:00 AM [name], MD [facility] None  11/19/2018  3:30 PM Marzetta Board, DPM [facility]  12/26/2018  8:30 AM Cameron Sprang, MD [facility] None  02/04/2019 11:00 AM [name], NP [facility] None    BP 140/90   Pulse 90   Temp 97.9 F (36.6 C)   Resp 15  Wt 164 lb (74.4 kg)   SpO2 99%   BMI 28.15 kg/m   Weight yesterday-164 Last visit weight-163 CBG PTA-345  Pt reports that he is doing ok, he was seen at his PCP  office yesterday for elevated CBG, his insulin was increased insulin to 130U at breakfast and 90U in the evening.  Reviewed his diet with him and he eats baked chicken, veggies, no sweets, no pastas, no breads, diet sodas.  Switching his coumadin to him taking it at night time instead of morning. He took it this morning-placed it in pill box for evening starting tomor.  He did a lot of walking yesterday as well. No issues or sob while doing that.  meds verified and pill box refilled. I advised him since he goes Friday for INR check if there are any changes he would have to do that and he felt comfortable. No c/p, no dizziness.  He still has h/a. No edema noted. Pt denies sob.  B/p elevated --he took his meds about an hour ago.  Yesterday at his PCP office his b/p was good.  He goes to coumadin clinic Friday-supposedly his co-pay should be $0-5-will see when he goes.  Asked him to contact insurance company to see if he is eligible for home b/p cuff.   Marylouise Stacks, [facility] Paramedic  10/17/18

## 2018-10-18 ENCOUNTER — Telehealth: Payer: Self-pay

## 2018-10-18 ENCOUNTER — Ambulatory Visit: Payer: Medicare HMO | Admitting: Podiatry

## 2018-10-18 NOTE — Telephone Encounter (Signed)

## 2018-10-19 NOTE — Telephone Encounter (Signed)
Called pt as missed Coumadin appt for today. He is rescheduled for Monday for NEW Coumadin visit.

## 2018-10-22 ENCOUNTER — Other Ambulatory Visit: Payer: Self-pay

## 2018-10-22 ENCOUNTER — Ambulatory Visit (INDEPENDENT_AMBULATORY_CARE_PROVIDER_SITE_OTHER): Payer: Medicare HMO | Admitting: Pharmacist

## 2018-10-22 DIAGNOSIS — Z5181 Encounter for therapeutic drug level monitoring: Secondary | ICD-10-CM | POA: Insufficient documentation

## 2018-10-22 DIAGNOSIS — I480 Paroxysmal atrial fibrillation: Secondary | ICD-10-CM | POA: Diagnosis not present

## 2018-10-22 LAB — POCT INR: INR: 1 — AB (ref 2.0–3.0)

## 2018-10-22 NOTE — Patient Instructions (Signed)
Description   Start taking 1.5 tablets (7.49m) every day. Recheck INR in 1 week. Coumadin clinic #[phone]

## 2018-10-23 ENCOUNTER — Other Ambulatory Visit (HOSPITAL_COMMUNITY): Payer: Self-pay

## 2018-10-23 NOTE — Progress Notes (Signed)
Paramedicine Encounter    Patient ID: Calvin [name], male    DOB: May 23, 1961, 58 y.o.   MRN: [id number]   [name], NP as PCP - General (Geriatric Medicine) [name], MD as PCP - Cardiology (Cardiology) [name], [name], MD as PCP - Advanced Heart Failure (Cardiology) [name], MD as Consulting Physician (Endocrinology)  Patient Active Problem List   Diagnosis Date Noted  . Encounter for therapeutic drug monitoring 10/22/2018  . Nausea and vomiting 03/10/2018  . AF (paroxysmal atrial fibrillation) ([place]) 03/10/2018  . Non-cardiac chest pain 03/09/2018  . Diabetic neuropathy ([place]) 09/26/2017  . MCI (mild cognitive impairment) 06/23/2017  . OSA on CPAP 05/10/2017  . Gastroesophageal reflux disease 05/10/2017  . Insomnia 05/10/2017  . Onychomycosis of multiple toenails with type 2 diabetes mellitus and peripheral neuropathy ([place]) 05/10/2017  . Chronic systolic heart failure ([place]) 11/22/2015  . Essential hypertension 11/22/2015  . DM (diabetes mellitus) ([place]) 11/20/2015  . HLD (hyperlipidemia) 11/20/2015  . Hx of adenomatous colonic polyps 05/19/2011    Current Outpatient Medications:  .  atorvastatin (LIPITOR) 80 MG tablet, Take 1 tablet (80 mg total) by mouth daily., Disp: 90 tablet, Rfl: 3 .  carvedilol (COREG) 6.25 MG tablet, Take 1 tablet (6.25 mg total) by mouth 2 (two) times daily with a meal., Disp: 180 tablet, Rfl: 3 .  donepezil (ARICEPT) 10 MG tablet, Take 1 tablet (10 mg total) by mouth at bedtime., Disp: 90 tablet, Rfl: 3 .  insulin NPH-regular Human (NOVOLIN 70/30) (70-30) 100 UNIT/ML injection, 130 units with breakfast, and 90 units with supper., Disp: 40 mL, Rfl: 3 .  Insulin Syringe-Needle U-100 (INSULIN SYRINGE .3CC/29GX1/2") 29G X 1/2" 0.3 ML MISC, Inject 1 Syringe 3 (three) times daily as directed. Check blood sugar three times daily. Dx: E11.9, Disp: 100 each, Rfl: 3 .  lisinopril (PRINIVIL,ZESTRIL) 10 MG  tablet, Take 1 tablet (10 mg total) by mouth at bedtime., Disp: 90 tablet, Rfl: 3 .  spironolactone (ALDACTONE) 25 MG tablet, Take 0.5 tablets (12.5 mg total) by mouth daily., Disp: 45 tablet, Rfl: 2 .  warfarin (COUMADIN) 5 MG tablet, Take 1 tablet (5 mg total) by mouth daily., Disp: 30 tablet, Rfl: 1 .  fluticasone (FLONASE) 50 MCG/ACT nasal spray, Place 1 spray into both nostrils 2 (two) times daily as needed for allergies or rhinitis. (Patient not taking: Reported on 10/17/2018), Disp: 16 g, Rfl: 1 .  nitroGLYCERIN (NITROSTAT) 0.4 MG SL tablet, Place 1 tablet (0.4 mg total) under the tongue every 5 (five) minutes as needed for chest pain. (Patient not taking: Reported on 10/17/2018), Disp: 30 tablet, Rfl: 1 No Known Allergies    Social History   Socioeconomic History  . Marital status: Single    Spouse name: Not on file  . Number of children: 0  . Years of education: Not on file  . Highest education level: Not on file  Occupational History  . Occupation: Disability  Social Needs  . Financial resource strain: Not on file  . Food insecurity:    Worry: Not on file    Inability: Not on file  . Transportation needs:    Medical: Not on file    Non-medical: Not on file  Tobacco Use  . Smoking status: Never Smoker  . Smokeless tobacco: Never Used  Substance and Sexual Activity  . Alcohol use: No  . Drug use: No  . Sexual activity: Yes    Birth control/protection: None  Lifestyle  .  Physical activity:    Days per week: Not on file    Minutes per session: Not on file  . Stress: Not on file  Relationships  . Social connections:    Talks on phone: Not on file    Gets together: Not on file    Attends religious service: Not on file    Active member of club or organization: Not on file    Attends meetings of clubs or organizations: Not on file    Relationship status: Not on file  . Intimate partner violence:    Fear of current or ex partner: Not on file    Emotionally abused: Not  on file    Physically abused: Not on file    Forced sexual activity: Not on file  Other Topics Concern  . Not on file  Social History Narrative   Social History      Diet?       Do you drink/eat things with caffeine? yes      Marital status?       single                             What year were you married?      Do you live in a house, apartment, assisted living, condo, trailer, etc.? yes      Is it one or more stories? One story      How many persons live in your home?      Do you have any pets in your home? (please list) none      Highest level of education completed? graduate      Current or past profession:      Do you exercise?            no                          Type & how often?      Advanced Directives      Do you have a living will? no      Do you have a DNR form?                                  If not, do you want to discuss one? no      Do you have signed POA/HPOA for forms? no      Functional Status      Do you have difficulty bathing or dressing yourself? no      Do you have difficulty preparing food or eating? no      Do you have difficulty managing your medications? no      Do you have difficulty managing your finances? no      Do you have difficulty affording your medications? no    Physical Exam      Future Appointments  Date Time Provider [place]  10/29/2018 11:30 AM [facility]  11/13/2018 10:00 AM [name], MD [facility] None  11/19/2018  3:30 PM Marzetta Board, DPM [facility]  12/26/2018  8:30 AM Cameron Sprang, MD [facility] None  02/04/2019 11:00 AM [name], NP [facility] None    BP 122/70   Pulse 90   Temp 98.6 F (37 C)   Resp 15   Wt 161 lb (73 kg)  SpO2 99%   BMI 27.64 kg/m   Weight yesterday-161 Last visit weight-164 CBG PTA-247  Pt reports doing eh--he feels more tired, his CBG's are still running elevated. Appetite is ok-- No  edema noted. No increased sob. He is still having h/a. I suggested he contact his neurologist for follow up. I sent message last week and have not heard back yet. His ECHO is being rescheduled.  meds verified and pill box refilled.  No missed doses of his meds.  His h/a started when he had trouble out of his last room mate a few wks ago.   Marylouise Stacks, [facility] Paramedic  10/23/18

## 2018-10-25 ENCOUNTER — Ambulatory Visit (HOSPITAL_COMMUNITY): Payer: Medicare HMO

## 2018-10-25 ENCOUNTER — Encounter (HOSPITAL_COMMUNITY): Payer: Medicare HMO

## 2018-10-25 ENCOUNTER — Telehealth (HOSPITAL_COMMUNITY): Payer: Self-pay | Admitting: Licensed Clinical Social Worker

## 2018-10-25 NOTE — Telephone Encounter (Signed)
CSW reached out to pt to check in regarding food and medication status at this time. Pt reports he has everything he needs at this time.  Pt lives alone but states his [name] is there often so he is not feeling isolated at this time.  Reports he stays busy watching TV, completing crossword puzzles, and getting outside when he can.  CSW encouraged pt to reach out with any concerns and will continue to follow and assist as needed  Jorge Ny, [facility] Worker [facility] [phone]

## 2018-10-26 ENCOUNTER — Telehealth: Payer: Self-pay

## 2018-10-26 NOTE — Telephone Encounter (Signed)

## 2018-10-29 ENCOUNTER — Ambulatory Visit (INDEPENDENT_AMBULATORY_CARE_PROVIDER_SITE_OTHER): Payer: Medicare HMO | Admitting: Pharmacist

## 2018-10-29 ENCOUNTER — Other Ambulatory Visit: Payer: Self-pay

## 2018-10-29 DIAGNOSIS — I480 Paroxysmal atrial fibrillation: Secondary | ICD-10-CM

## 2018-10-29 DIAGNOSIS — Z5181 Encounter for therapeutic drug level monitoring: Secondary | ICD-10-CM | POA: Diagnosis not present

## 2018-10-29 LAB — POCT INR: INR: 1 — AB (ref 2.0–3.0)

## 2018-10-29 MED ORDER — WARFARIN SODIUM 5 MG PO TABS: 5.0000 mg | ORAL_TABLET | Freq: Every day | ORAL | 1 refills | Status: DC

## 2018-11-01 ENCOUNTER — Other Ambulatory Visit (HOSPITAL_COMMUNITY): Payer: Self-pay

## 2018-11-01 NOTE — Progress Notes (Signed)
Paramedicine Encounter    Patient ID: Calvin [name], male    DOB: [birth year], 58 y.o.   MRN: [id number]   [name], NP as PCP - General (Geriatric Medicine) [name], MD as PCP - Cardiology (Cardiology) [name], [name], MD as PCP - Advanced Heart Failure (Cardiology) [name], MD as Consulting Physician (Endocrinology)  Patient Active Problem List   Diagnosis Date Noted  . Encounter for therapeutic drug monitoring 10/22/2018  . Nausea and vomiting 03/10/2018  . AF (paroxysmal atrial fibrillation) ([place]) 03/10/2018  . Non-cardiac chest pain 03/09/2018  . Diabetic neuropathy ([place]) 09/26/2017  . MCI (mild cognitive impairment) 06/23/2017  . OSA on CPAP 05/10/2017  . Gastroesophageal reflux disease 05/10/2017  . Insomnia 05/10/2017  . Onychomycosis of multiple toenails with type 2 diabetes mellitus and peripheral neuropathy ([place]) 05/10/2017  . Chronic systolic heart failure ([place]) 11/22/2015  . Essential hypertension 11/22/2015  . DM (diabetes mellitus) ([place]) 11/20/2015  . HLD (hyperlipidemia) 11/20/2015  . Hx of adenomatous colonic polyps 05/19/2011    Current Outpatient Medications:  .  atorvastatin (LIPITOR) 80 MG tablet, Take 1 tablet (80 mg total) by mouth daily., Disp: 90 tablet, Rfl: 3 .  carvedilol (COREG) 6.25 MG tablet, Take 1 tablet (6.25 mg total) by mouth 2 (two) times daily with a meal., Disp: 180 tablet, Rfl: 3 .  donepezil (ARICEPT) 10 MG tablet, Take 1 tablet (10 mg total) by mouth at bedtime., Disp: 90 tablet, Rfl: 3 .  insulin NPH-regular Human (NOVOLIN 70/30) (70-30) 100 UNIT/ML injection, 130 units with breakfast, and 90 units with supper., Disp: 40 mL, Rfl: 3 .  Insulin Syringe-Needle U-100 (INSULIN SYRINGE .3CC/29GX1/2") 29G X 1/2" 0.3 ML MISC, Inject 1 Syringe 3 (three) times daily as directed. Check blood sugar three times daily. Dx: E11.9, Disp: 100 each, Rfl: 3 .  lisinopril (PRINIVIL,ZESTRIL) 10 MG  tablet, Take 1 tablet (10 mg total) by mouth at bedtime., Disp: 90 tablet, Rfl: 3 .  spironolactone (ALDACTONE) 25 MG tablet, Take 0.5 tablets (12.5 mg total) by mouth daily., Disp: 45 tablet, Rfl: 2 .  warfarin (COUMADIN) 5 MG tablet, Take 1 tablet (5 mg total) by mouth daily., Disp: 60 tablet, Rfl: 1 .  fluticasone (FLONASE) 50 MCG/ACT nasal spray, Place 1 spray into both nostrils 2 (two) times daily as needed for allergies or rhinitis. (Patient not taking: Reported on 10/17/2018), Disp: 16 g, Rfl: 1 .  nitroGLYCERIN (NITROSTAT) 0.4 MG SL tablet, Place 1 tablet (0.4 mg total) under the tongue every 5 (five) minutes as needed for chest pain. (Patient not taking: Reported on 10/17/2018), Disp: 30 tablet, Rfl: 1 No Known Allergies    Social History   Socioeconomic History  . Marital status: Single    Spouse name: Not on file  . Number of children: 0  . Years of education: Not on file  . Highest education level: Not on file  Occupational History  . Occupation: Disability  Social Needs  . Financial resource strain: Not on file  . Food insecurity:    Worry: Not on file    Inability: Not on file  . Transportation needs:    Medical: Not on file    Non-medical: Not on file  Tobacco Use  . Smoking status: Never Smoker  . Smokeless tobacco: Never Used  Substance and Sexual Activity  . Alcohol use: No  . Drug use: No  . Sexual activity: Yes    Birth control/protection: None  Lifestyle  .  Physical activity:    Days per week: Not on file    Minutes per session: Not on file  . Stress: Not on file  Relationships  . Social connections:    Talks on phone: Not on file    Gets together: Not on file    Attends religious service: Not on file    Active member of club or organization: Not on file    Attends meetings of clubs or organizations: Not on file    Relationship status: Not on file  . Intimate partner violence:    Fear of current or ex partner: Not on file    Emotionally abused: Not  on file    Physically abused: Not on file    Forced sexual activity: Not on file  Other Topics Concern  . Not on file  Social History Narrative   Social History      Diet?       Do you drink/eat things with caffeine? yes      Marital status?       single                             What year were you married?      Do you live in a house, apartment, assisted living, condo, trailer, etc.? yes      Is it one or more stories? One story      How many persons live in your home?      Do you have any pets in your home? (please list) none      Highest level of education completed? graduate      Current or past profession:      Do you exercise?            no                          Type & how often?      Advanced Directives      Do you have a living will? no      Do you have a DNR form?                                  If not, do you want to discuss one? no      Do you have signed POA/HPOA for forms? no      Functional Status      Do you have difficulty bathing or dressing yourself? no      Do you have difficulty preparing food or eating? no      Do you have difficulty managing your medications? no      Do you have difficulty managing your finances? no      Do you have difficulty affording your medications? no    Physical Exam      Future Appointments  Date Time Provider [place]  11/05/2018 11:00 AM [facility]  11/13/2018 10:00 AM [name], MD [facility] None  11/19/2018  3:30 PM Marzetta Board, DPM [facility]  12/26/2018  8:30 AM Cameron Sprang, MD [facility] None  02/04/2019 11:00 AM [name], NP [facility] None    BP 134/80   Pulse (!) 108   Resp (!) 22   SpO2 98%   Weight yesterday-cant remember  Last visit weight-161  Pt reports he is under a lot of stress right now with his roommate. She was there and they began arguing and getting very loud with each other. He has had issus  with his roommates for the past several wks.  Pt denies increased sob, no edema noted. Lungs clear.  I asked her to stop speaking to him and getting him upset,v/s as noted. Surprised his b/p is not up more than that what it is.  meds verified and pill box refilled.  He states going weekly to get INR checked is not a problem. His dosing is as per Epic anti-coag noted. Will f/u next week.   Marylouise Stacks, [place] [facility] Paramedic  11/01/18

## 2018-11-02 ENCOUNTER — Telehealth: Payer: Self-pay

## 2018-11-02 ENCOUNTER — Telehealth (HOSPITAL_COMMUNITY): Payer: Self-pay | Admitting: Licensed Clinical Social Worker

## 2018-11-02 NOTE — Telephone Encounter (Signed)
CSW informed by community paramedic that pt having issues with bedbugs and unable afford exterminator. CSW ordered at home bedbug spray to be sent to pt home.  CSW will continue to follow and assist as needed  Jorge Ny, [facility] Desk#: [phone] Cell#: [phone]

## 2018-11-02 NOTE — Telephone Encounter (Signed)

## 2018-11-05 ENCOUNTER — Other Ambulatory Visit: Payer: Self-pay

## 2018-11-05 ENCOUNTER — Ambulatory Visit (INDEPENDENT_AMBULATORY_CARE_PROVIDER_SITE_OTHER): Payer: Medicare HMO | Admitting: *Deleted

## 2018-11-05 DIAGNOSIS — Z5181 Encounter for therapeutic drug level monitoring: Secondary | ICD-10-CM | POA: Diagnosis not present

## 2018-11-05 DIAGNOSIS — I480 Paroxysmal atrial fibrillation: Secondary | ICD-10-CM

## 2018-11-05 LAB — POCT INR
INR: 1 — AB (ref 2.0–3.0)
INR: 1 — AB (ref 2.0–3.0)

## 2018-11-05 NOTE — Patient Instructions (Signed)
Spoke with pt instructed pt to take 2.5 tablets today then change dose to 2.5 tablets daily except 2 tablets on Monday, Wednesday and Friday. Recheck INR in 1 week. Coumadin clinic [phone]

## 2018-11-08 ENCOUNTER — Other Ambulatory Visit (HOSPITAL_COMMUNITY): Payer: Self-pay

## 2018-11-08 NOTE — Progress Notes (Signed)
Paramedicine Encounter    Patient ID: Calvin [name], male    DOB: 1961-01-18, 58 y.o.   MRN: [id number]   [name], NP as PCP - General (Geriatric Medicine) [name], MD as PCP - Cardiology (Cardiology) [name], [name], MD as PCP - Advanced Heart Failure (Cardiology) [name], MD as Consulting Physician (Endocrinology)  Patient Active Problem List   Diagnosis Date Noted  . Encounter for therapeutic drug monitoring 10/22/2018  . Nausea and vomiting 03/10/2018  . AF (paroxysmal atrial fibrillation) ([place]) 03/10/2018  . Non-cardiac chest pain 03/09/2018  . Diabetic neuropathy ([place]) 09/26/2017  . MCI (mild cognitive impairment) 06/23/2017  . OSA on CPAP 05/10/2017  . Gastroesophageal reflux disease 05/10/2017  . Insomnia 05/10/2017  . Onychomycosis of multiple toenails with type 2 diabetes mellitus and peripheral neuropathy ([place]) 05/10/2017  . Chronic systolic heart failure ([place]) 11/22/2015  . Essential hypertension 11/22/2015  . DM (diabetes mellitus) ([place]) 11/20/2015  . HLD (hyperlipidemia) 11/20/2015  . Hx of adenomatous colonic polyps 05/19/2011    Current Outpatient Medications:  .  atorvastatin (LIPITOR) 80 MG tablet, Take 1 tablet (80 mg total) by mouth daily., Disp: 90 tablet, Rfl: 3 .  carvedilol (COREG) 6.25 MG tablet, Take 1 tablet (6.25 mg total) by mouth 2 (two) times daily with a meal., Disp: 180 tablet, Rfl: 3 .  donepezil (ARICEPT) 10 MG tablet, Take 1 tablet (10 mg total) by mouth at bedtime., Disp: 90 tablet, Rfl: 3 .  insulin NPH-regular Human (NOVOLIN 70/30) (70-30) 100 UNIT/ML injection, 130 units with breakfast, and 90 units with supper., Disp: 40 mL, Rfl: 3 .  Insulin Syringe-Needle U-100 (INSULIN SYRINGE .3CC/29GX1/2") 29G X 1/2" 0.3 ML MISC, Inject 1 Syringe 3 (three) times daily as directed. Check blood sugar three times daily. Dx: E11.9, Disp: 100 each, Rfl: 3 .  lisinopril (PRINIVIL,ZESTRIL) 10 MG  tablet, Take 1 tablet (10 mg total) by mouth at bedtime., Disp: 90 tablet, Rfl: 3 .  spironolactone (ALDACTONE) 25 MG tablet, Take 0.5 tablets (12.5 mg total) by mouth daily., Disp: 45 tablet, Rfl: 2 .  warfarin (COUMADIN) 5 MG tablet, Take 1 tablet (5 mg total) by mouth daily., Disp: 60 tablet, Rfl: 1 .  fluticasone (FLONASE) 50 MCG/ACT nasal spray, Place 1 spray into both nostrils 2 (two) times daily as needed for allergies or rhinitis. (Patient not taking: Reported on 10/17/2018), Disp: 16 g, Rfl: 1 .  nitroGLYCERIN (NITROSTAT) 0.4 MG SL tablet, Place 1 tablet (0.4 mg total) under the tongue every 5 (five) minutes as needed for chest pain. (Patient not taking: Reported on 10/17/2018), Disp: 30 tablet, Rfl: 1 No Known Allergies    Social History   Socioeconomic History  . Marital status: Single    Spouse name: Not on file  . Number of children: 0  . Years of education: Not on file  . Highest education level: Not on file  Occupational History  . Occupation: Disability  Social Needs  . Financial resource strain: Not on file  . Food insecurity:    Worry: Not on file    Inability: Not on file  . Transportation needs:    Medical: Not on file    Non-medical: Not on file  Tobacco Use  . Smoking status: Never Smoker  . Smokeless tobacco: Never Used  Substance and Sexual Activity  . Alcohol use: No  . Drug use: No  . Sexual activity: Yes    Birth control/protection: None  Lifestyle  .  Physical activity:    Days per week: Not on file    Minutes per session: Not on file  . Stress: Not on file  Relationships  . Social connections:    Talks on phone: Not on file    Gets together: Not on file    Attends religious service: Not on file    Active member of club or organization: Not on file    Attends meetings of clubs or organizations: Not on file    Relationship status: Not on file  . Intimate partner violence:    Fear of current or ex partner: Not on file    Emotionally abused: Not  on file    Physically abused: Not on file    Forced sexual activity: Not on file  Other Topics Concern  . Not on file  Social History Narrative   Social History      Diet?       Do you drink/eat things with caffeine? yes      Marital status?       single                             What year were you married?      Do you live in a house, apartment, assisted living, condo, trailer, etc.? yes      Is it one or more stories? One story      How many persons live in your home?      Do you have any pets in your home? (please list) none      Highest level of education completed? graduate      Current or past profession:      Do you exercise?            no                          Type & how often?      Advanced Directives      Do you have a living will? no      Do you have a DNR form?                                  If not, do you want to discuss one? no      Do you have signed POA/HPOA for forms? no      Functional Status      Do you have difficulty bathing or dressing yourself? no      Do you have difficulty preparing food or eating? no      Do you have difficulty managing your medications? no      Do you have difficulty managing your finances? no      Do you have difficulty affording your medications? no    Physical Exam      Future Appointments  Date Time Provider [place]  11/12/2018 11:00 AM [facility]  11/13/2018 10:00 AM [name], MD [facility] None  11/19/2018  3:30 PM Marzetta Board, DPM [facility]  12/26/2018  8:30 AM Cameron Sprang, MD [facility] None  02/04/2019 11:00 AM [name], NP [facility] None    BP 130/76 (BP Location: Left Arm, Patient Position: Sitting, Cuff Size: Normal)   Pulse 96   Temp (!) 97.5 F (36.4 C)  Resp 15   Wt 161 lb (73 kg)   SpO2 99%   BMI 27.64 kg/m   Weight yesterday-161 Last visit weight-? Didn't weigh CBG PTA-240  Pt reports he is  doing ok, he reports his CBG have remained elevated.  meds verified and pill box refilled, since he is going for INR check on Monday then I will leave the mon/tues/wed bedtime dose for coumadin empty and he can fill it up per INR check. He is able to tell me exactly how he is to take that correctly. The other meds he is not comfortable with filling his own pill box--we are going to be working on that to gain his independence for medication management.  His h/a still occurs, no word back from his neurologist. I advised him to call that office and see if he can get someone on the phone.   He is having intermittent hearing issues. He will contact his PCP regarding that.   Marylouise Stacks, [place] [facility] Paramedic  11/08/18

## 2018-11-09 ENCOUNTER — Telehealth: Payer: Self-pay

## 2018-11-09 NOTE — Telephone Encounter (Signed)
lmom for prescreen  

## 2018-11-12 ENCOUNTER — Other Ambulatory Visit (HOSPITAL_COMMUNITY): Payer: Self-pay

## 2018-11-12 ENCOUNTER — Ambulatory Visit (INDEPENDENT_AMBULATORY_CARE_PROVIDER_SITE_OTHER): Payer: Medicare HMO | Admitting: *Deleted

## 2018-11-12 ENCOUNTER — Telehealth: Payer: Self-pay | Admitting: Neurology

## 2018-11-12 ENCOUNTER — Telehealth (HOSPITAL_COMMUNITY): Payer: Self-pay | Admitting: Licensed Clinical Social Worker

## 2018-11-12 ENCOUNTER — Other Ambulatory Visit: Payer: Self-pay

## 2018-11-12 DIAGNOSIS — Z5181 Encounter for therapeutic drug level monitoring: Secondary | ICD-10-CM

## 2018-11-12 DIAGNOSIS — I480 Paroxysmal atrial fibrillation: Secondary | ICD-10-CM

## 2018-11-12 LAB — POCT INR: INR: 1 — AB (ref 2.0–3.0)

## 2018-11-12 NOTE — Telephone Encounter (Signed)
Patient is wanting to know if he can get a sooner appt than his June one. Is he a canididate for the E-visit? He has been having increase headaches and other problems and just wants to talk with you. Otherwise, do you have any medication help until his June appt. Thanks!

## 2018-11-12 NOTE — Telephone Encounter (Signed)
Pt called no answer voice mail left for him to call office

## 2018-11-12 NOTE — Telephone Encounter (Signed)
CSW informed by community paramedic that bed bug spray delivery never arrived.  CSW looked through order history and order was supposed to arrive on May 3rd.  Per patient packages often go missing in his area and he believes they were likely stolen.  CSW ordered sprays again and having delivered to alternative address to avoid packages going missing and will have community paramedic deliver packages to patient home.  CSW also informed that pt has been having headaches but has been unable to get a hold of anyone at his neurology office.  CSW identified that pt has been to [facility] in the past.  [place] called their office and was able to speak with representative.  CSW explained that pt is having recurrent headaches that are causing him concern.  CSW informed that pt has an appt on June 24th at this time- CSW requested that they see if pt can be scheduled earlier.  Pt will be followed up with by phone to see if an earlier appt can be made to assess headaches.  CSW will continue to follow and assist as needed  Jorge Ny, [facility] Desk#: [phone] Cell#: [phone]

## 2018-11-12 NOTE — Telephone Encounter (Signed)
Pls let him know there is no magic pill for headaches, especially since he has been having this for a long time. We can start a daily medication to help reduce the frequency and intensity of his headaches, we have to start at a low dose and slowly increase it, otherwise he will have side effects. The name of the medication is amitriptyline 66m: Take 1 tab at bedtime for a week, then increase to 2 tabs at bedtime. Main side effect is drowsiness so take it at bedtime. It is a low dose and will not magically take pain away, but we can increase dose further as long as he is not having side effects. On his follow-up in June, we can talk about it further. If he is okay with starting med, pls send in Rx, thanks

## 2018-11-12 NOTE — Progress Notes (Signed)
Paramedicine Encounter    Patient ID: Muhamed Luecke, male    DOB: April 09, 1961, 58 y.o.   MRN: [id number]   [name], NP as PCP - General (Geriatric Medicine) [name], MD as PCP - Cardiology (Cardiology) [name], [name], MD as PCP - Advanced Heart Failure (Cardiology) [name], MD as Consulting Physician (Endocrinology)  Patient Active Problem List   Diagnosis Date Noted  . Encounter for therapeutic drug monitoring 10/22/2018  . Nausea and vomiting 03/10/2018  . AF (paroxysmal atrial fibrillation) ([place]) 03/10/2018  . Non-cardiac chest pain 03/09/2018  . Diabetic neuropathy ([place]) 09/26/2017  . MCI (mild cognitive impairment) 06/23/2017  . OSA on CPAP 05/10/2017  . Gastroesophageal reflux disease 05/10/2017  . Insomnia 05/10/2017  . Onychomycosis of multiple toenails with type 2 diabetes mellitus and peripheral neuropathy ([place]) 05/10/2017  . Chronic systolic heart failure ([place]) 11/22/2015  . Essential hypertension 11/22/2015  . DM (diabetes mellitus) ([place]) 11/20/2015  . HLD (hyperlipidemia) 11/20/2015  . Hx of adenomatous colonic polyps 05/19/2011    Current Outpatient Medications:  .  atorvastatin (LIPITOR) 80 MG tablet, Take 1 tablet (80 mg total) by mouth daily., Disp: 90 tablet, Rfl: 3 .  carvedilol (COREG) 6.25 MG tablet, Take 1 tablet (6.25 mg total) by mouth 2 (two) times daily with a meal., Disp: 180 tablet, Rfl: 3 .  donepezil (ARICEPT) 10 MG tablet, Take 1 tablet (10 mg total) by mouth at bedtime., Disp: 90 tablet, Rfl: 3 .  insulin NPH-regular Human (NOVOLIN 70/30) (70-30) 100 UNIT/ML injection, 130 units with breakfast, and 90 units with supper., Disp: 40 mL, Rfl: 3 .  Insulin Syringe-Needle U-100 (INSULIN SYRINGE .3CC/29GX1/2") 29G X 1/2" 0.3 ML MISC, Inject 1 Syringe 3 (three) times daily as directed. Check blood sugar three times daily. Dx: E11.9, Disp: 100 each, Rfl: 3 .  lisinopril (PRINIVIL,ZESTRIL) 10 MG  tablet, Take 1 tablet (10 mg total) by mouth at bedtime., Disp: 90 tablet, Rfl: 3 .  spironolactone (ALDACTONE) 25 MG tablet, Take 0.5 tablets (12.5 mg total) by mouth daily., Disp: 45 tablet, Rfl: 2 .  warfarin (COUMADIN) 5 MG tablet, Take 1 tablet (5 mg total) by mouth daily., Disp: 60 tablet, Rfl: 1 .  fluticasone (FLONASE) 50 MCG/ACT nasal spray, Place 1 spray into both nostrils 2 (two) times daily as needed for allergies or rhinitis. (Patient not taking: Reported on 10/17/2018), Disp: 16 g, Rfl: 1 .  nitroGLYCERIN (NITROSTAT) 0.4 MG SL tablet, Place 1 tablet (0.4 mg total) under the tongue every 5 (five) minutes as needed for chest pain. (Patient not taking: Reported on 10/17/2018), Disp: 30 tablet, Rfl: 1 No Known Allergies    Social History   Socioeconomic History  . Marital status: Single    Spouse name: Not on file  . Number of children: 0  . Years of education: Not on file  . Highest education level: Not on file  Occupational History  . Occupation: Disability  Social Needs  . Financial resource strain: Not on file  . Food insecurity:    Worry: Not on file    Inability: Not on file  . Transportation needs:    Medical: Not on file    Non-medical: Not on file  Tobacco Use  . Smoking status: Never Smoker  . Smokeless tobacco: Never Used  Substance and Sexual Activity  . Alcohol use: No  . Drug use: No  . Sexual activity: Yes    Birth control/protection: None  Lifestyle  .  Physical activity:    Days per week: Not on file    Minutes per session: Not on file  . Stress: Not on file  Relationships  . Social connections:    Talks on phone: Not on file    Gets together: Not on file    Attends religious service: Not on file    Active member of club or organization: Not on file    Attends meetings of clubs or organizations: Not on file    Relationship status: Not on file  . Intimate partner violence:    Fear of current or ex partner: Not on file    Emotionally abused: Not  on file    Physically abused: Not on file    Forced sexual activity: Not on file  Other Topics Concern  . Not on file  Social History Narrative   Social History      Diet?       Do you drink/eat things with caffeine? yes      Marital status?       single                             What year were you married?      Do you live in a house, apartment, assisted living, condo, trailer, etc.? yes      Is it one or more stories? One story      How many persons live in your home?      Do you have any pets in your home? (please list) none      Highest level of education completed? graduate      Current or past profession:      Do you exercise?            no                          Type & how often?      Advanced Directives      Do you have a living will? no      Do you have a DNR form?                                  If not, do you want to discuss one? no      Do you have signed POA/HPOA for forms? no      Functional Status      Do you have difficulty bathing or dressing yourself? no      Do you have difficulty preparing food or eating? no      Do you have difficulty managing your medications? no      Do you have difficulty managing your finances? no      Do you have difficulty affording your medications? no    Physical Exam      Future Appointments  Date Time Provider [place]  11/13/2018 10:00 AM [name], MD [facility] None  11/19/2018  3:30 PM Marzetta Board, DPM [facility]  12/26/2018  8:30 AM Cameron Sprang, MD [facility] None  02/04/2019 11:00 AM [name], NP [facility] None    BP (!) 150/78 (BP Location: Left Arm, Patient Position: Sitting, Cuff Size: Normal)   Pulse 72   Temp (!) 96.4 F (35.8 C)   Resp 15   Wt 161 lb (73  kg)   SpO2 99%   BMI 27.64 kg/m   Weight yesterday-160 Last visit weight-161  Pt reports he isnt feeling well today, pt states he has had bad headache today-sounds like migraine-he denies  increased sob, no chest pain, he has tried to contact neuro doc as well without any luck. He denies missing doses of his meds. He also states that he has been feeling very cold then feeling hot--sounds like chills but no fever at this time. He denies cough. His arms do feel very chilled. He states if he continues to feel worse then he will seek emergency care, he is going to get family member to get him some tylenol and try that.  meds verified and pill box refilled.   Marylouise Stacks, [facility] Paramedic  11/12/18

## 2018-11-13 ENCOUNTER — Ambulatory Visit: Payer: Medicare HMO | Admitting: Endocrinology

## 2018-11-13 NOTE — Telephone Encounter (Signed)
Pt called at new number given to office today by Marylouise Stacks EMT no answer voice mail was left for pt to call the office tomorrow due to we close at 3:30 . I will try and call him again tomorrow at his new number

## 2018-11-13 NOTE — Telephone Encounter (Signed)
Pt called no answer voice mail left for pt to call back

## 2018-11-14 ENCOUNTER — Other Ambulatory Visit: Payer: Self-pay

## 2018-11-14 MED ORDER — AMITRIPTYLINE HCL 10 MG PO TABS: ORAL_TABLET | ORAL | 3 refills | Status: DC

## 2018-11-14 NOTE — Telephone Encounter (Signed)
Pt called he agreed to start amitriptyline medication sent to [facility]

## 2018-11-15 ENCOUNTER — Encounter: Payer: Self-pay | Admitting: Endocrinology

## 2018-11-15 ENCOUNTER — Ambulatory Visit (INDEPENDENT_AMBULATORY_CARE_PROVIDER_SITE_OTHER): Payer: Medicare HMO | Admitting: Endocrinology

## 2018-11-15 ENCOUNTER — Other Ambulatory Visit: Payer: Self-pay

## 2018-11-15 VITALS — BP 132/70 | HR 85 | Ht 64.0 in | Wt 163.4 lb

## 2018-11-15 DIAGNOSIS — E1140 Type 2 diabetes mellitus with diabetic neuropathy, unspecified: Secondary | ICD-10-CM

## 2018-11-15 DIAGNOSIS — Z794 Long term (current) use of insulin: Secondary | ICD-10-CM

## 2018-11-15 LAB — POCT GLYCOSYLATED HEMOGLOBIN (HGB A1C): Hemoglobin A1C: 11.1 % — AB (ref 4.0–5.6)

## 2018-11-15 LAB — GLUCOSE, POCT (MANUAL RESULT ENTRY): POC Glucose: 291 mg/dl — AB (ref 70–99)

## 2018-11-15 MED ORDER — INSULIN GLARGINE 100 UNIT/ML SOLOSTAR PEN: 200.0000 [IU] | PEN_INJECTOR | SUBCUTANEOUS | 11 refills | Status: DC

## 2018-11-15 NOTE — Patient Instructions (Addendum)
I have sent a prescription to your pharmacy, to change to Lantus, 200 units each morning.  If your insurance does not like this, we'll change to a similar one that they like.  check your blood sugar twice a day.  vary the time of day when you check, between before the 3 meals, and at bedtime.  also check if you have symptoms of your blood sugar being too high or too low.  please keep a record of the readings and bring it to your next appointment here (or you can bring the meter itself).  You can write it on any piece of paper.  please call us sooner if your blood sugar goes below 70, or if you have a lot of readings over 200. Please come back for a follow-up appointment in 2 weeks.

## 2018-11-15 NOTE — Progress Notes (Signed)
Subjective:    Patient ID: Calvin [name], male    DOB: 02-05-1961, 58 y.o.   MRN: [id number]  HPI Pt returns for f/u of diabetes mellitus: DM type: Insulin-requiring type 2. Dx'ed: 7322 Complications: polyneuropathy, CAD, renal insuff, and DR.   Therapy: insulin since soon after dx.  DKA: never Severe hypoglycemia: never.   Pancreatitis: never Pancreatic imaging: normal on 2003 CT. Other: he takes human insulin, due to cost; due to noncompliance, he is not a candidate for multiple daily injections.   Interval history: no cbg record, but states cbg's vary from 240-315.  There is no trend throughout the day.  Pt says he sometimes misses the insulin.  He says he can afford the insulin.   Past Medical History:  Diagnosis Date  . Adenoidal hypertrophy   . Adenomatous colon polyps 2012  . Arthritis   . Diabetes mellitus   . GERD (gastroesophageal reflux disease)   . Heart attack ([place])    [place] living in [place]  [name] Hx of adenomatous colonic polyps 08/18/2017  . Hyperlipidemia   . Hypertension   . Mild CAD    a. cath in 08/2017 showing mild nonobstructive CAD with scattered 20-30% stenosis.   . Nonischemic cardiomyopathy ([place])    a. EF 20-25% by echo in 09/2017 with cath showing mild CAD. b.  Last echo 12/2017 EF 35-40%, grade 2 DD.  [name] Obesity   . PAF (paroxysmal atrial fibrillation) ([place])   . Sleep apnea    cpap    Past Surgical History:  Procedure Laterality Date  . COLONOSCOPY  05/13/11   9 adenomas  . FOREARM SURGERY    . MUSCLE BIOPSY    . RIGHT/LEFT HEART CATH AND CORONARY ANGIOGRAPHY N/A 08/25/2017   Procedure: RIGHT/LEFT HEART CATH AND CORONARY ANGIOGRAPHY;  Surgeon: [name], MD;  Location: [facility];  Service: Cardiovascular;  Laterality: N/A;    Social History   Socioeconomic History  . Marital status: Single    Spouse name: Not on file  . Number of children: 0  . Years of education: Not on file  . Highest education level: Not on file   Occupational History  . Occupation: Disability  Social Needs  . Financial resource strain: Not on file  . Food insecurity:    Worry: Not on file    Inability: Not on file  . Transportation needs:    Medical: Not on file    Non-medical: Not on file  Tobacco Use  . Smoking status: Never Smoker  . Smokeless tobacco: Never Used  Substance and Sexual Activity  . Alcohol use: No  . Drug use: No  . Sexual activity: Yes    Birth control/protection: None  Lifestyle  . Physical activity:    Days per week: Not on file    Minutes per session: Not on file  . Stress: Not on file  Relationships  . Social connections:    Talks on phone: Not on file    Gets together: Not on file    Attends religious service: Not on file    Active member of club or organization: Not on file    Attends meetings of clubs or organizations: Not on file    Relationship status: Not on file  . Intimate partner violence:    Fear of current or ex partner: Not on file    Emotionally abused: Not on file    Physically abused: Not on file    Forced sexual activity: Not  on file  Other Topics Concern  . Not on file  Social History Narrative   Social History      Diet?       Do you drink/eat things with caffeine? yes      Marital status?       single                             What year were you married?      Do you live in a house, apartment, assisted living, condo, trailer, etc.? yes      Is it one or more stories? One story      How many persons live in your home?      Do you have any pets in your home? (please list) none      Highest level of education completed? graduate      Current or past profession:      Do you exercise?            no                          Type & how often?      Advanced Directives      Do you have a living will? no      Do you have a DNR form?                                  If not, do you want to discuss one? no      Do you have signed POA/HPOA for forms? no       Functional Status      Do you have difficulty bathing or dressing yourself? no      Do you have difficulty preparing food or eating? no      Do you have difficulty managing your medications? no      Do you have difficulty managing your finances? no      Do you have difficulty affording your medications? no    Current Outpatient Medications on File Prior to Visit  Medication Sig Dispense Refill  . amitriptyline (ELAVIL) 10 MG tablet Take 1 tablet at bed time for one week and then increase 2 tablets at bed time 60 tablet 3  . atorvastatin (LIPITOR) 80 MG tablet Take 1 tablet (80 mg total) by mouth daily. 90 tablet 3  . carvedilol (COREG) 6.25 MG tablet Take 1 tablet (6.25 mg total) by mouth 2 (two) times daily with a meal. 180 tablet 3  . donepezil (ARICEPT) 10 MG tablet Take 1 tablet (10 mg total) by mouth at bedtime. 90 tablet 3  . fluticasone (FLONASE) 50 MCG/ACT nasal spray Place 1 spray into both nostrils 2 (two) times daily as needed for allergies or rhinitis. 16 g 1  . Insulin Syringe-Needle U-100 (INSULIN SYRINGE .3CC/29GX1/2") 29G X 1/2" 0.3 ML MISC Inject 1 Syringe 3 (three) times daily as directed. Check blood sugar three times daily. Dx: E11.9 100 each 3  . lisinopril (PRINIVIL,ZESTRIL) 10 MG tablet Take 1 tablet (10 mg total) by mouth at bedtime. 90 tablet 3  . nitroGLYCERIN (NITROSTAT) 0.4 MG SL tablet Place 1 tablet (0.4 mg total) under the tongue every 5 (five) minutes as needed for chest pain. 30 tablet 1  . spironolactone (ALDACTONE) 25 MG tablet Take 0.5 tablets (  12.5 mg total) by mouth daily. 45 tablet 2  . warfarin (COUMADIN) 5 MG tablet Take 1 tablet (5 mg total) by mouth daily. 60 tablet 1   No current facility-administered medications on file prior to visit.     No Known Allergies  Family History  Problem Relation Age of Onset  . Heart disease Mother   . Heart attack Mother 27  . Hypertension Mother   . Hyperlipidemia Mother   . Diabetes Mother   . Heart  disease Father   . Heart attack Father 67  . Hypertension Father   . Hyperlipidemia Father   . Diabetes Father   . Heart attack Sister 41  . Colon cancer Neg Hx   . Stomach cancer Neg Hx   . Esophageal cancer Neg Hx   . Rectal cancer Neg Hx     BP 132/70 (BP Location: Left Arm, Patient Position: Sitting, Cuff Size: Normal)   Pulse 85   Ht 5' 4"  (1.626 m)   Wt 163 lb 6.4 oz (74.1 kg)   SpO2 96%   BMI 28.05 kg/m    Review of Systems He denies hypoglycemia.      Objective:   Physical Exam VITAL SIGNS:  See vs page GENERAL: no distress Pulses: dorsalis pedis intact bilat.   MSK: no deformity of the feet CV: no leg edema Skin:  no ulcer on the feet.  normal color and temp on the feet. Neuro: sensation is intact to touch on the feet Ext: There is bilateral onychomycosis of the toenails  Lab Results  Component Value Date   HGBA1C 11.1 (A) 11/15/2018        Assessment & Plan:  Insulin-requiring type 2 DM, with renal insuff: worse.  Noncompliance with cbg recording and insulin.  We'll re-try qd insulin analog, to see if pt can afford copay.   Patient Instructions  I have sent a prescription to your pharmacy, to change to Lantus, 200 units each morning.  If your insurance does not like this, we'll change to a similar one that they like.  check your blood sugar twice a day.  vary the time of day when you check, between before the 3 meals, and at bedtime.  also check if you have symptoms of your blood sugar being too high or too low.  please keep a record of the readings and bring it to your next appointment here (or you can bring the meter itself).  You can write it on any piece of paper.  please call us sooner if your blood sugar goes below 70, or if you have a lot of readings over 200. Please come back for a follow-up appointment in 2 weeks.

## 2018-11-16 NOTE — Patient Instructions (Signed)
Description   Spoke with pt instructed pt to take 2.5 tablets today then change dose to 2.5 tablets daily except 2 tablets on Monday, Wednesday and Friday. Recheck INR in 1 week. Coumadin clinic [phone]

## 2018-11-19 ENCOUNTER — Other Ambulatory Visit (HOSPITAL_COMMUNITY): Payer: Self-pay

## 2018-11-19 ENCOUNTER — Ambulatory Visit: Payer: Medicare HMO | Admitting: Podiatry

## 2018-11-19 NOTE — Progress Notes (Signed)
Paramedicine Encounter    Patient ID: Calvin [name], male    DOB: Aug 13, 1960, 58 y.o.   MRN: [id number]   [name], NP as PCP - General (Geriatric Medicine) [name], MD as PCP - Cardiology (Cardiology) [name], [name], MD as PCP - Advanced Heart Failure (Cardiology) [name], MD as Consulting Physician (Endocrinology)  Patient Active Problem List   Diagnosis Date Noted  . Encounter for therapeutic drug monitoring 10/22/2018  . Nausea and vomiting 03/10/2018  . AF (paroxysmal atrial fibrillation) ([place]) 03/10/2018  . Non-cardiac chest pain 03/09/2018  . Diabetic neuropathy ([place]) 09/26/2017  . MCI (mild cognitive impairment) 06/23/2017  . OSA on CPAP 05/10/2017  . Gastroesophageal reflux disease 05/10/2017  . Insomnia 05/10/2017  . Onychomycosis of multiple toenails with type 2 diabetes mellitus and peripheral neuropathy ([place]) 05/10/2017  . Chronic systolic heart failure ([place]) 11/22/2015  . Essential hypertension 11/22/2015  . DM (diabetes mellitus) ([place]) 11/20/2015  . HLD (hyperlipidemia) 11/20/2015  . Hx of adenomatous colonic polyps 05/19/2011    Current Outpatient Medications:  .  amitriptyline (ELAVIL) 10 MG tablet, Take 1 tablet at bed time for one week and then increase 2 tablets at bed time, Disp: 60 tablet, Rfl: 3 .  atorvastatin (LIPITOR) 80 MG tablet, Take 1 tablet (80 mg total) by mouth daily., Disp: 90 tablet, Rfl: 3 .  carvedilol (COREG) 6.25 MG tablet, Take 1 tablet (6.25 mg total) by mouth 2 (two) times daily with a meal., Disp: 180 tablet, Rfl: 3 .  donepezil (ARICEPT) 10 MG tablet, Take 1 tablet (10 mg total) by mouth at bedtime., Disp: 90 tablet, Rfl: 3 .  Insulin Glargine (LANTUS SOLOSTAR) 100 UNIT/ML Solostar Pen, Inject 200 Units into the skin every morning., Disp: 25 pen, Rfl: 11 .  Insulin Syringe-Needle U-100 (INSULIN SYRINGE .3CC/29GX1/2") 29G X 1/2" 0.3 ML MISC, Inject 1 Syringe 3 (three) times  daily as directed. Check blood sugar three times daily. Dx: E11.9, Disp: 100 each, Rfl: 3 .  lisinopril (PRINIVIL,ZESTRIL) 10 MG tablet, Take 1 tablet (10 mg total) by mouth at bedtime., Disp: 90 tablet, Rfl: 3 .  spironolactone (ALDACTONE) 25 MG tablet, Take 0.5 tablets (12.5 mg total) by mouth daily., Disp: 45 tablet, Rfl: 2 .  warfarin (COUMADIN) 5 MG tablet, Take 1 tablet (5 mg total) by mouth daily., Disp: 60 tablet, Rfl: 1 .  fluticasone (FLONASE) 50 MCG/ACT nasal spray, Place 1 spray into both nostrils 2 (two) times daily as needed for allergies or rhinitis. (Patient not taking: Reported on 11/19/2018), Disp: 16 g, Rfl: 1 .  nitroGLYCERIN (NITROSTAT) 0.4 MG SL tablet, Place 1 tablet (0.4 mg total) under the tongue every 5 (five) minutes as needed for chest pain. (Patient not taking: Reported on 11/19/2018), Disp: 30 tablet, Rfl: 1 No Known Allergies    Social History   Socioeconomic History  . Marital status: Single    Spouse name: Not on file  . Number of children: 0  . Years of education: Not on file  . Highest education level: Not on file  Occupational History  . Occupation: Disability  Social Needs  . Financial resource strain: Not on file  . Food insecurity:    Worry: Not on file    Inability: Not on file  . Transportation needs:    Medical: Not on file    Non-medical: Not on file  Tobacco Use  . Smoking status: Never Smoker  . Smokeless tobacco: Never Used  Substance and  Sexual Activity  . Alcohol use: No  . Drug use: No  . Sexual activity: Yes    Birth control/protection: None  Lifestyle  . Physical activity:    Days per week: Not on file    Minutes per session: Not on file  . Stress: Not on file  Relationships  . Social connections:    Talks on phone: Not on file    Gets together: Not on file    Attends religious service: Not on file    Active member of club or organization: Not on file    Attends meetings of clubs or organizations: Not on file     Relationship status: Not on file  . Intimate partner violence:    Fear of current or ex partner: Not on file    Emotionally abused: Not on file    Physically abused: Not on file    Forced sexual activity: Not on file  Other Topics Concern  . Not on file  Social History Narrative   Social History      Diet?       Do you drink/eat things with caffeine? yes      Marital status?       single                             What year were you married?      Do you live in a house, apartment, assisted living, condo, trailer, etc.? yes      Is it one or more stories? One story      How many persons live in your home?      Do you have any pets in your home? (please list) none      Highest level of education completed? graduate      Current or past profession:      Do you exercise?            no                          Type & how often?      Advanced Directives      Do you have a living will? no      Do you have a DNR form?                                  If not, do you want to discuss one? no      Do you have signed POA/HPOA for forms? no      Functional Status      Do you have difficulty bathing or dressing yourself? no      Do you have difficulty preparing food or eating? no      Do you have difficulty managing your medications? no      Do you have difficulty managing your finances? no      Do you have difficulty affording your medications? no    Physical Exam      Future Appointments  Date Time Provider [place]  11/23/2018 11:15 AM [facility]  11/29/2018 10:15 AM [name], MD [facility] None  12/04/2018  9:45 AM Marzetta Board, DPM [facility]  12/26/2018  8:30 AM Cameron Sprang, MD [facility] None  02/04/2019 11:00 AM [name], NP [facility] None  BP 134/76   Pulse 92   Temp (!) 97.3 F (36.3 C)   Resp 16   Wt 159 lb (72.1 kg)   SpO2 99%   BMI 27.29 kg/m   Weight  yesterday-160 Last visit weight-164  Pts neuro got back in touch with him to start amitriptyline for his h/a-he states those are much improved and he is able to sleep better. He states he is feeling better, appetite still on the low side, he is losing weight since last visit.  meds verified and pill box refilled, he goes back to coumadin clinic on Friday so once he gets those results he is able to place it in pill box.  His insulin was increased recently as well.  Will f/u next week on Monday.   Marylouise Stacks, [place] Paramedic  11/19/18

## 2018-11-22 ENCOUNTER — Telehealth: Payer: Self-pay

## 2018-11-22 NOTE — Telephone Encounter (Signed)

## 2018-11-23 ENCOUNTER — Other Ambulatory Visit: Payer: Self-pay

## 2018-11-23 ENCOUNTER — Ambulatory Visit (INDEPENDENT_AMBULATORY_CARE_PROVIDER_SITE_OTHER): Payer: Medicare HMO | Admitting: *Deleted

## 2018-11-23 DIAGNOSIS — I480 Paroxysmal atrial fibrillation: Secondary | ICD-10-CM | POA: Diagnosis not present

## 2018-11-23 DIAGNOSIS — Z5181 Encounter for therapeutic drug level monitoring: Secondary | ICD-10-CM | POA: Diagnosis not present

## 2018-11-23 LAB — POCT INR: INR: 1.3 — AB (ref 2.0–3.0)

## 2018-11-27 ENCOUNTER — Other Ambulatory Visit (HOSPITAL_COMMUNITY): Payer: Self-pay

## 2018-11-27 NOTE — Progress Notes (Addendum)
Paramedicine Encounter    Patient ID: Calvin [name], male    DOB: June 14, 1961, 58 y.o.   MRN: [id number]   [name], NP as PCP - General (Geriatric Medicine) [name], MD as PCP - Cardiology (Cardiology) [name], [name], MD as PCP - Advanced Heart Failure (Cardiology) [name], MD as Consulting Physician (Endocrinology)  Patient Active Problem List   Diagnosis Date Noted  . Encounter for therapeutic drug monitoring 10/22/2018  . Nausea and vomiting 03/10/2018  . AF (paroxysmal atrial fibrillation) ([place]) 03/10/2018  . Non-cardiac chest pain 03/09/2018  . Diabetic neuropathy ([place]) 09/26/2017  . MCI (mild cognitive impairment) 06/23/2017  . OSA on CPAP 05/10/2017  . Gastroesophageal reflux disease 05/10/2017  . Insomnia 05/10/2017  . Onychomycosis of multiple toenails with type 2 diabetes mellitus and peripheral neuropathy ([place]) 05/10/2017  . Chronic systolic heart failure ([place]) 11/22/2015  . Essential hypertension 11/22/2015  . DM (diabetes mellitus) ([place]) 11/20/2015  . HLD (hyperlipidemia) 11/20/2015  . Hx of adenomatous colonic polyps 05/19/2011    Current Outpatient Medications:  .  amitriptyline (ELAVIL) 10 MG tablet, Take 1 tablet at bed time for one week and then increase 2 tablets at bed time, Disp: 60 tablet, Rfl: 3 .  atorvastatin (LIPITOR) 80 MG tablet, Take 1 tablet (80 mg total) by mouth daily., Disp: 90 tablet, Rfl: 3 .  carvedilol (COREG) 6.25 MG tablet, Take 1 tablet (6.25 mg total) by mouth 2 (two) times daily with a meal., Disp: 180 tablet, Rfl: 3 .  donepezil (ARICEPT) 10 MG tablet, Take 1 tablet (10 mg total) by mouth at bedtime., Disp: 90 tablet, Rfl: 3 .  Insulin Glargine (LANTUS SOLOSTAR) 100 UNIT/ML Solostar Pen, Inject 200 Units into the skin every morning., Disp: 25 pen, Rfl: 11 .  Insulin Syringe-Needle U-100 (INSULIN SYRINGE .3CC/29GX1/2") 29G X 1/2" 0.3 ML MISC, Inject 1 Syringe 3 (three) times  daily as directed. Check blood sugar three times daily. Dx: E11.9, Disp: 100 each, Rfl: 3 .  lisinopril (PRINIVIL,ZESTRIL) 10 MG tablet, Take 1 tablet (10 mg total) by mouth at bedtime., Disp: 90 tablet, Rfl: 3 .  spironolactone (ALDACTONE) 25 MG tablet, Take 0.5 tablets (12.5 mg total) by mouth daily., Disp: 45 tablet, Rfl: 2 .  warfarin (COUMADIN) 5 MG tablet, Take 1 tablet (5 mg total) by mouth daily., Disp: 60 tablet, Rfl: 1 .  fluticasone (FLONASE) 50 MCG/ACT nasal spray, Place 1 spray into both nostrils 2 (two) times daily as needed for allergies or rhinitis. (Patient not taking: Reported on 11/19/2018), Disp: 16 g, Rfl: 1 .  nitroGLYCERIN (NITROSTAT) 0.4 MG SL tablet, Place 1 tablet (0.4 mg total) under the tongue every 5 (five) minutes as needed for chest pain. (Patient not taking: Reported on 11/19/2018), Disp: 30 tablet, Rfl: 1 No Known Allergies    Social History   Socioeconomic History  . Marital status: Single    Spouse name: Not on file  . Number of children: 0  . Years of education: Not on file  . Highest education level: Not on file  Occupational History  . Occupation: Disability  Social Needs  . Financial resource strain: Not on file  . Food insecurity:    Worry: Not on file    Inability: Not on file  . Transportation needs:    Medical: Not on file    Non-medical: Not on file  Tobacco Use  . Smoking status: Never Smoker  . Smokeless tobacco: Never Used  Substance and  Sexual Activity  . Alcohol use: No  . Drug use: No  . Sexual activity: Yes    Birth control/protection: None  Lifestyle  . Physical activity:    Days per week: Not on file    Minutes per session: Not on file  . Stress: Not on file  Relationships  . Social connections:    Talks on phone: Not on file    Gets together: Not on file    Attends religious service: Not on file    Active member of club or organization: Not on file    Attends meetings of clubs or organizations: Not on file     Relationship status: Not on file  . Intimate partner violence:    Fear of current or ex partner: Not on file    Emotionally abused: Not on file    Physically abused: Not on file    Forced sexual activity: Not on file  Other Topics Concern  . Not on file  Social History Narrative   Social History      Diet?       Do you drink/eat things with caffeine? yes      Marital status?       single                             What year were you married?      Do you live in a house, apartment, assisted living, condo, trailer, etc.? yes      Is it one or more stories? One story      How many persons live in your home?      Do you have any pets in your home? (please list) none      Highest level of education completed? graduate      Current or past profession:      Do you exercise?            no                          Type & how often?      Advanced Directives      Do you have a living will? no      Do you have a DNR form?                                  If not, do you want to discuss one? no      Do you have signed POA/HPOA for forms? no      Functional Status      Do you have difficulty bathing or dressing yourself? no      Do you have difficulty preparing food or eating? no      Do you have difficulty managing your medications? no      Do you have difficulty managing your finances? no      Do you have difficulty affording your medications? no    Physical Exam      Future Appointments  Date Time Provider [place]  11/29/2018 10:15 AM [name], MD [facility] None  11/30/2018 11:30 AM [facility]  12/04/2018  9:45 AM Marzetta Board, DPM [facility]  12/26/2018  8:30 AM Cameron Sprang, MD [facility] None  02/04/2019 11:00 AM [name], NP [facility] None  BP 116/70   Pulse (!) 104   Temp 97.7 F (36.5 C)   Resp 15   Wt 160 lb (72.6 kg)   SpO2 98%   BMI 27.46 kg/m   Weight  yesterday-160 Last visit weight-159 CBG PTA-245  Pt reports he is doing ok. He has been busy helping his niece out on her apt since she just had a baby.  He states his h/a have improved, sleeping much better. But still poor appetite. No c/p, no dizziness.  Pt is concerned that his INR is still below goal and he is considering going back to eliquis and just paying the more expensive co-pay each month and then it will also save him from going to the doctor each week.  He goes back this Friday for next INR check and will talk to them about it when he goes Friday. Need to get his atorvastatin, spiro sent to [place].  Need to send message to his neuro doc that his amitriptyline needs to be sent to [facility] as well.  Pt reports no increased sob. He does a lot of walking without difficulty.  No missed doses noted. meds verified and pill box refilled.  He is going to do a virtual visit with diabetic doc. His CBG's are still running elevated-will discuss that as his visit this week.  Will f/u next week.   Marylouise Stacks, [facility] [facility] Paramedic  11/27/18

## 2018-11-28 ENCOUNTER — Other Ambulatory Visit (HOSPITAL_COMMUNITY): Payer: Self-pay | Admitting: Cardiology

## 2018-11-28 ENCOUNTER — Other Ambulatory Visit: Payer: Self-pay

## 2018-11-28 MED ORDER — AMITRIPTYLINE HCL 10 MG PO TABS: ORAL_TABLET | ORAL | 3 refills | Status: DC

## 2018-11-28 MED ORDER — SPIRONOLACTONE 25 MG PO TABS: 12.5000 mg | ORAL_TABLET | Freq: Every day | ORAL | 3 refills | Status: DC

## 2018-11-28 MED ORDER — ATORVASTATIN CALCIUM 80 MG PO TABS: 80.0000 mg | ORAL_TABLET | Freq: Every day | ORAL | 3 refills | Status: DC

## 2018-11-28 NOTE — Telephone Encounter (Signed)
Calvin [name] with Paramedicine called to request a refill on spiro and [facility]atin [facility] As requested refills sent

## 2018-11-29 ENCOUNTER — Ambulatory Visit (INDEPENDENT_AMBULATORY_CARE_PROVIDER_SITE_OTHER): Payer: Medicare HMO | Admitting: Endocrinology

## 2018-11-29 ENCOUNTER — Telehealth (HOSPITAL_COMMUNITY): Payer: Self-pay | Admitting: Licensed Clinical Social Worker

## 2018-11-29 ENCOUNTER — Other Ambulatory Visit: Payer: Self-pay

## 2018-11-29 ENCOUNTER — Telehealth: Payer: Self-pay

## 2018-11-29 DIAGNOSIS — E1140 Type 2 diabetes mellitus with diabetic neuropathy, unspecified: Secondary | ICD-10-CM

## 2018-11-29 DIAGNOSIS — Z794 Long term (current) use of insulin: Secondary | ICD-10-CM | POA: Diagnosis not present

## 2018-11-29 MED ORDER — INSULIN GLARGINE 100 UNIT/ML SOLOSTAR PEN: 200.0000 [IU] | PEN_INJECTOR | SUBCUTANEOUS | 11 refills | Status: DC

## 2018-11-29 NOTE — Telephone Encounter (Signed)
1. COVID-19 Pre-Screening Questions:  . In the past 7 to 10 days have you had a cough,  shortness of breath, headache, congestion, fever (100 or greater) body aches, chills, sore throat, or sudden loss of taste or sense of smell?no . Have you been around anyone with known Covid 19. no . Have you been around anyone who is awaiting Covid 19 test results in the past 7 to 10 days?no . Have you been around anyone who has been exposed to Covid 19, or has mentioned symptoms of Covid 19 within the past 7 to 10 days?no   2. Pt advised of visitor restrictions (no visitors allowed except if needed to conduct the visit). Also advised to arrive at appointment time and wear a mask.  yes

## 2018-11-29 NOTE — Patient Instructions (Addendum)
I have sent a prescription to your [facility], to change to Lantus, 200 units each morning.  If your insurance does not like this, we'll change to a similar one that they like.  check your blood sugar twice a day.  vary the time of day when you check, between before the 3 meals, and at bedtime.  also check if you have symptoms of your blood sugar being too high or too low.  please keep a record of the readings and bring it to your next appointment here (or you can bring the meter itself).  You can write it on any piece of paper.  please call us sooner if your blood sugar goes below 70, or if you have a lot of readings over 200. Please have a follow-up appointment next week.

## 2018-11-29 NOTE — Telephone Encounter (Signed)
CSW informed by community paramedic that pt is not liking being on coumadin and is planning to go back to getting Eliquis- pt had originally stopped Eliquis due to copay costs but feels as if he can budget accordingly and afford medication short term while we finalize patient assistance.  Pt currently not eligible to receive medication through [facility] [place] because he had to meet out of pocket requirement.  CSW called [facility] to get current copay balance for 2020- [facility] requires that pt signs consent- CSW informed pt who will go by the pharmacy today to sign and get medication cost print out.  CSW called [facility] who is faxing statement to clinic  Pt also uses [facility] pharmacy but they report he has had no copay costs so far for this year.  CSW will continue to follow and assist as needed- will coordinate finalizing patients application for assistance once he has met out of pocket requirement.   H. , LCSW Clinical Social Worker [facility] Desk#: [phone] Cell#: [phone]   

## 2018-11-29 NOTE — Progress Notes (Signed)
Subjective:    Patient ID: Calvin [name], male    DOB: 02/19/1961, 58 y.o.   MRN: [id number]  HPI  telehealth visit today via doxy video visit.  Alternatives to telehealth are presented to this patient, and the patient agrees to the telehealth visit. Pt is advised of the cost of the visit, and agrees to this, also.   Patient is at home, and I am at the office.   Persons attending the telehealth visit: the patient and I Pt returns for f/u of diabetes mellitus: DM type: Insulin-requiring type 2. Dx'ed: 2831 Complications: polyneuropathy, CAD, renal insuff, and DR.   Therapy: insulin since soon after dx.  DKA: never Severe hypoglycemia: never.   Pancreatitis: never Pancreatic imaging: normal on 2003 CT. Other: he takes human insulin, due to cost; due to noncompliance, he is not a candidate for multiple daily injections.   Interval history: no cbg record, but states cbg's vary from 200-500.  Pt says he never misses the insulin.  He says [facility] would not fill the Lantus.  Main symptoms are lack of appetite and insomnia.   Past Medical History:  Diagnosis Date  . Adenoidal hypertrophy   . Adenomatous colon polyps 2012  . Arthritis   . Diabetes mellitus   . GERD (gastroesophageal reflux disease)   . Heart attack ([place])    [place] living in [place]  [name] Hx of adenomatous colonic polyps 08/18/2017  . Hyperlipidemia   . Hypertension   . Mild CAD    a. cath in 08/2017 showing mild nonobstructive CAD with scattered 20-30% stenosis.   . Nonischemic cardiomyopathy ([place])    a. EF 20-25% by echo in 09/2017 with cath showing mild CAD. b.  Last echo 12/2017 EF 35-40%, grade 2 DD.  [name] Obesity   . PAF (paroxysmal atrial fibrillation) ([place])   . Sleep apnea    cpap    Past Surgical History:  Procedure Laterality Date  . COLONOSCOPY  05/13/11   9 adenomas  . FOREARM SURGERY    . MUSCLE BIOPSY    . RIGHT/LEFT HEART CATH AND CORONARY ANGIOGRAPHY N/A 08/25/2017   Procedure: RIGHT/LEFT HEART CATH  AND CORONARY ANGIOGRAPHY;  Surgeon: [name], MD;  Location: [facility];  Service: Cardiovascular;  Laterality: N/A;    Social History   Socioeconomic History  . Marital status: Single    Spouse name: Not on file  . Number of children: 0  . Years of education: Not on file  . Highest education level: Not on file  Occupational History  . Occupation: Disability  Social Needs  . Financial resource strain: Not on file  . Food insecurity:    Worry: Not on file    Inability: Not on file  . Transportation needs:    Medical: Not on file    Non-medical: Not on file  Tobacco Use  . Smoking status: Never Smoker  . Smokeless tobacco: Never Used  Substance and Sexual Activity  . Alcohol use: No  . Drug use: No  . Sexual activity: Yes    Birth control/protection: None  Lifestyle  . Physical activity:    Days per week: Not on file    Minutes per session: Not on file  . Stress: Not on file  Relationships  . Social connections:    Talks on phone: Not on file    Gets together: Not on file    Attends religious service: Not on file    Active member of club or  organization: Not on file    Attends meetings of clubs or organizations: Not on file    Relationship status: Not on file  . Intimate partner violence:    Fear of current or ex partner: Not on file    Emotionally abused: Not on file    Physically abused: Not on file    Forced sexual activity: Not on file  Other Topics Concern  . Not on file  Social History Narrative   Social History      Diet?       Do you drink/eat things with caffeine? yes      Marital status?       single                             What year were you married?      Do you live in a house, apartment, assisted living, condo, trailer, etc.? yes      Is it one or more stories? One story      How many persons live in your home?      Do you have any pets in your home? (please list) none      Highest level of education completed?  graduate      Current or past profession:      Do you exercise?            no                          Type & how often?      Advanced Directives      Do you have a living will? no      Do you have a DNR form?                                  If not, do you want to discuss one? no      Do you have signed POA/HPOA for forms? no      Functional Status      Do you have difficulty bathing or dressing yourself? no      Do you have difficulty preparing food or eating? no      Do you have difficulty managing your medications? no      Do you have difficulty managing your finances? no      Do you have difficulty affording your medications? no    Current Outpatient Medications on File Prior to Visit  Medication Sig Dispense Refill  . amitriptyline (ELAVIL) 10 MG tablet Take 1 tablet at bed time for one week and then increase 2 tablets at bed time 60 tablet 3  . atorvastatin (LIPITOR) 80 MG tablet Take 1 tablet (80 mg total) by mouth daily. 90 tablet 3  . carvedilol (COREG) 6.25 MG tablet Take 1 tablet (6.25 mg total) by mouth 2 (two) times daily with a meal. 180 tablet 3  . donepezil (ARICEPT) 10 MG tablet Take 1 tablet (10 mg total) by mouth at bedtime. 90 tablet 3  . fluticasone (FLONASE) 50 MCG/ACT nasal spray Place 1 spray into both nostrils 2 (two) times daily as needed for allergies or rhinitis. 16 g 1  . insulin NPH-regular Human (70-30) 100 UNIT/ML injection Take 120 units in AM and 90 units in PM    . Insulin Syringe-Needle U-100 (INSULIN SYRINGE .3CC/29GX1/2")  29G X 1/2" 0.3 ML MISC Inject 1 Syringe 3 (three) times daily as directed. Check blood sugar three times daily. Dx: E11.9 100 each 3  . lisinopril (PRINIVIL,ZESTRIL) 10 MG tablet Take 1 tablet (10 mg total) by mouth at bedtime. 90 tablet 3  . nitroGLYCERIN (NITROSTAT) 0.4 MG SL tablet Place 1 tablet (0.4 mg total) under the tongue every 5 (five) minutes as needed for chest pain. 30 tablet 1  . spironolactone (ALDACTONE) 25  MG tablet Take 0.5 tablets (12.5 mg total) by mouth daily. 45 tablet 3  . warfarin (COUMADIN) 5 MG tablet Take 1 tablet (5 mg total) by mouth daily. 60 tablet 1   No current facility-administered medications on file prior to visit.     No Known Allergies  Family History  Problem Relation Age of Onset  . Heart disease Mother   . Heart attack Mother 89  . Hypertension Mother   . Hyperlipidemia Mother   . Diabetes Mother   . Heart disease Father   . Heart attack Father 99  . Hypertension Father   . Hyperlipidemia Father   . Diabetes Father   . Heart attack Sister 63  . Colon cancer Neg Hx   . Stomach cancer Neg Hx   . Esophageal cancer Neg Hx   . Rectal cancer Neg Hx       Review of Systems He denies hypoglycemia    Objective:   Physical Exam    Lab Results  Component Value Date   CREATININE 1.29 (H) 09/10/2018   BUN 13 09/10/2018   NA 135 09/10/2018   K 4.2 09/10/2018   CL 103 09/10/2018   CO2 26 09/10/2018      Assessment & Plan:  Insulin-requiring type 2 DM, with CAD: he needs increased rx. Renal failure: he is at risk for hypoglycemia in the early hrs of the morning, so we'll start at just 200 units qam.  Noncompliance with insulin: we'll try sending to a different pharmacy   Patient Instructions  I have sent a prescription to your [facility], to change to Lantus, 200 units each morning.  If your insurance does not like this, we'll change to a similar one that they like.  check your blood sugar twice a day.  vary the time of day when you check, between before the 3 meals, and at bedtime.  also check if you have symptoms of your blood sugar being too high or too low.  please keep a record of the readings and bring it to your next appointment here (or you can bring the meter itself).  You can write it on any piece of paper.  please call us sooner if your blood sugar goes below 70, or if you have a lot of readings over 200. Please have a follow-up appointment  next week.

## 2018-12-03 ENCOUNTER — Ambulatory Visit: Payer: Medicare HMO | Admitting: Nurse Practitioner

## 2018-12-04 ENCOUNTER — Telehealth: Payer: Self-pay | Admitting: Endocrinology

## 2018-12-04 ENCOUNTER — Encounter: Payer: Self-pay | Admitting: Endocrinology

## 2018-12-04 ENCOUNTER — Ambulatory Visit: Payer: Medicare HMO | Admitting: Podiatry

## 2018-12-04 ENCOUNTER — Other Ambulatory Visit (HOSPITAL_COMMUNITY): Payer: Self-pay

## 2018-12-04 ENCOUNTER — Telehealth: Payer: Self-pay

## 2018-12-04 NOTE — Telephone Encounter (Signed)
PA initiated [facility] @ [phone], Ref #[id number]. Spoke with Cassandra. States PA for qty exception for Lantus Solostar will take 24 hours for review and response. Will receive fax re: results of PA. Will await insurance response re: approval/denial.

## 2018-12-04 NOTE — Progress Notes (Signed)
Paramedicine Encounter    Patient ID: Calvin [name], male    DOB: 15-Jun-1961, 58 y.o.   MRN: [id number]   [name], NP as PCP - General (Geriatric Medicine) [name], MD as PCP - Cardiology (Cardiology) [name], [name], MD as PCP - Advanced Heart Failure (Cardiology) [name], MD as Consulting Physician (Endocrinology)  Patient Active Problem List   Diagnosis Date Noted  . Encounter for therapeutic drug monitoring 10/22/2018  . Nausea and vomiting 03/10/2018  . AF (paroxysmal atrial fibrillation) ([place]) 03/10/2018  . Non-cardiac chest pain 03/09/2018  . Diabetic neuropathy ([place]) 09/26/2017  . MCI (mild cognitive impairment) 06/23/2017  . OSA on CPAP 05/10/2017  . Gastroesophageal reflux disease 05/10/2017  . Insomnia 05/10/2017  . Onychomycosis of multiple toenails with type 2 diabetes mellitus and peripheral neuropathy ([place]) 05/10/2017  . Chronic systolic heart failure ([place]) 11/22/2015  . Essential hypertension 11/22/2015  . DM (diabetes mellitus) ([place]) 11/20/2015  . HLD (hyperlipidemia) 11/20/2015  . Hx of adenomatous colonic polyps 05/19/2011    Current Outpatient Medications:  .  amitriptyline (ELAVIL) 10 MG tablet, Take 1 tablet at bed time for one week and then increase 2 tablets at bed time, Disp: 60 tablet, Rfl: 3 .  atorvastatin (LIPITOR) 80 MG tablet, Take 1 tablet (80 mg total) by mouth daily., Disp: 90 tablet, Rfl: 3 .  carvedilol (COREG) 6.25 MG tablet, Take 1 tablet (6.25 mg total) by mouth 2 (two) times daily with a meal., Disp: 180 tablet, Rfl: 3 .  donepezil (ARICEPT) 10 MG tablet, Take 1 tablet (10 mg total) by mouth at bedtime., Disp: 90 tablet, Rfl: 3 .  fluticasone (FLONASE) 50 MCG/ACT nasal spray, Place 1 spray into both nostrils 2 (two) times daily as needed for allergies or rhinitis., Disp: 16 g, Rfl: 1 .  Insulin Glargine (LANTUS SOLOSTAR) 100 UNIT/ML Solostar Pen, Inject 200 Units into the skin  every morning., Disp: 25 pen, Rfl: 11 .  insulin NPH-regular Human (70-30) 100 UNIT/ML injection, Take 120 units in AM and 90 units in PM, Disp: , Rfl:  .  Insulin Syringe-Needle U-100 (INSULIN SYRINGE .3CC/29GX1/2") 29G X 1/2" 0.3 ML MISC, Inject 1 Syringe 3 (three) times daily as directed. Check blood sugar three times daily. Dx: E11.9, Disp: 100 each, Rfl: 3 .  lisinopril (PRINIVIL,ZESTRIL) 10 MG tablet, Take 1 tablet (10 mg total) by mouth at bedtime., Disp: 90 tablet, Rfl: 3 .  spironolactone (ALDACTONE) 25 MG tablet, Take 0.5 tablets (12.5 mg total) by mouth daily., Disp: 45 tablet, Rfl: 3 .  warfarin (COUMADIN) 5 MG tablet, Take 1 tablet (5 mg total) by mouth daily., Disp: 60 tablet, Rfl: 1 .  nitroGLYCERIN (NITROSTAT) 0.4 MG SL tablet, Place 1 tablet (0.4 mg total) under the tongue every 5 (five) minutes as needed for chest pain. (Patient not taking: Reported on 12/04/2018), Disp: 30 tablet, Rfl: 1 No Known Allergies    Social History   Socioeconomic History  . Marital status: Single    Spouse name: Not on file  . Number of children: 0  . Years of education: Not on file  . Highest education level: Not on file  Occupational History  . Occupation: Disability  Social Needs  . Financial resource strain: Not on file  . Food insecurity:    Worry: Not on file    Inability: Not on file  . Transportation needs:    Medical: Not on file    Non-medical: Not on file  Tobacco Use  . Smoking status: Never Smoker  . Smokeless tobacco: Never Used  Substance and Sexual Activity  . Alcohol use: No  . Drug use: No  . Sexual activity: Yes    Birth control/protection: None  Lifestyle  . Physical activity:    Days per week: Not on file    Minutes per session: Not on file  . Stress: Not on file  Relationships  . Social connections:    Talks on phone: Not on file    Gets together: Not on file    Attends religious service: Not on file    Active member of club or organization: Not on file     Attends meetings of clubs or organizations: Not on file    Relationship status: Not on file  . Intimate partner violence:    Fear of current or ex partner: Not on file    Emotionally abused: Not on file    Physically abused: Not on file    Forced sexual activity: Not on file  Other Topics Concern  . Not on file  Social History Narrative   Social History      Diet?       Do you drink/eat things with caffeine? yes      Marital status?       single                             What year were you married?      Do you live in a house, apartment, assisted living, condo, trailer, etc.? yes      Is it one or more stories? One story      How many persons live in your home?      Do you have any pets in your home? (please list) none      Highest level of education completed? graduate      Current or past profession:      Do you exercise?            no                          Type & how often?      Advanced Directives      Do you have a living will? no      Do you have a DNR form?                                  If not, do you want to discuss one? no      Do you have signed POA/HPOA for forms? no      Functional Status      Do you have difficulty bathing or dressing yourself? no      Do you have difficulty preparing food or eating? no      Do you have difficulty managing your medications? no      Do you have difficulty managing your finances? no      Do you have difficulty affording your medications? no    Physical Exam      Future Appointments  Date Time Provider [place]  12/06/2018  1:45 PM [facility]  12/26/2018  8:30 AM Cameron Sprang, MD [facility] None  02/04/2019 11:00 AM [name], NP [facility] None    BP  132/80   Pulse 82   Temp (!) 97.2 F (36.2 C)   Resp 15   Wt 161 lb (73 kg)   SpO2 99%   BMI 27.64 kg/m   Weight yesterday-161 Last visit weight-160 CBG PTA-247  Pt reports he is  doing great, no more h/a, sleeping much better. Pt denies sob, no dizziness., no c/p. No edema noted.  He no longer wants to take the coumadin and wants to go back to eliquis-he goes to INR check on Thursday and at that point if its below 2.0 then he can go straight to taking the eliquis. Called that refill in for him to pick up on Thursday.  Pharmacy had to fax his doc about the increased dose of insulin before it can be filled--so pharmacy is still waiting to hear back.  meds verified and pill box refilled--coumadin is in thru wed pm and depending on results from INR on Thursday is if he is going to continue coumadin or start the eliquis.   Marylouise Stacks, [facility] Paramedic  12/04/18

## 2018-12-06 ENCOUNTER — Other Ambulatory Visit: Payer: Self-pay

## 2018-12-06 ENCOUNTER — Ambulatory Visit (INDEPENDENT_AMBULATORY_CARE_PROVIDER_SITE_OTHER): Payer: Medicare HMO | Admitting: Pharmacist

## 2018-12-06 ENCOUNTER — Other Ambulatory Visit: Payer: Self-pay | Admitting: Pharmacist

## 2018-12-06 DIAGNOSIS — Z5181 Encounter for therapeutic drug level monitoring: Secondary | ICD-10-CM | POA: Diagnosis not present

## 2018-12-06 DIAGNOSIS — I480 Paroxysmal atrial fibrillation: Secondary | ICD-10-CM | POA: Diagnosis not present

## 2018-12-06 LAB — POCT INR: INR: 1 — AB (ref 2.0–3.0)

## 2018-12-06 MED ORDER — APIXABAN 5 MG PO TABS: 5.0000 mg | ORAL_TABLET | Freq: Two times a day (BID) | ORAL | 5 refills | Status: DC

## 2018-12-06 NOTE — Patient Instructions (Signed)
Description   Spoke with pt instructed pt to take 3.5 tablets today, then  change dose to 3 tablets daily except 2.5 tablets Sundays, Tuesdays and Thursdays. Recheck INR in 1 week. Coumadin clinic [phone]

## 2018-12-07 NOTE — Telephone Encounter (Signed)
[place], [phone], Ref# [id number]. Spoke with [place]. States supporting documentation was received and has been forwarded to [employer]. States appeal is still in process. Will not receive results of appeal for 5-7 days from 12/05/18. Will continue to await response re: approval/denial.

## 2018-12-11 ENCOUNTER — Telehealth (HOSPITAL_COMMUNITY): Payer: Self-pay

## 2018-12-11 NOTE — Telephone Encounter (Signed)
Pt called me to reschedule our appointment today--he reports his car broke down and he will not be able to get back home by end of day. Will resch for tomor afternoon.    Marylouise Stacks, EMT-Paramedic  12/11/18

## 2018-12-12 ENCOUNTER — Other Ambulatory Visit (HOSPITAL_COMMUNITY): Payer: Self-pay

## 2018-12-12 NOTE — Progress Notes (Signed)
Paramedicine Encounter    Patient ID: Calvin [name], male    DOB: 08-Mar-1961, 58 y.o.   MRN: [id number]   [name], NP as PCP - General (Geriatric Medicine) [name], MD as PCP - Cardiology (Cardiology) [name], [name], MD as PCP - Advanced Heart Failure (Cardiology) [name], MD as Consulting Physician (Endocrinology)  Patient Active Problem List   Diagnosis Date Noted  . Encounter for therapeutic drug monitoring 10/22/2018  . Nausea and vomiting 03/10/2018  . AF (paroxysmal atrial fibrillation) ([place]) 03/10/2018  . Non-cardiac chest pain 03/09/2018  . Diabetic neuropathy ([place]) 09/26/2017  . MCI (mild cognitive impairment) 06/23/2017  . OSA on CPAP 05/10/2017  . Gastroesophageal reflux disease 05/10/2017  . Insomnia 05/10/2017  . Onychomycosis of multiple toenails with type 2 diabetes mellitus and peripheral neuropathy ([place]) 05/10/2017  . Chronic systolic heart failure ([place]) 11/22/2015  . Essential hypertension 11/22/2015  . DM (diabetes mellitus) ([place]) 11/20/2015  . HLD (hyperlipidemia) 11/20/2015  . Hx of adenomatous colonic polyps 05/19/2011    Current Outpatient Medications:  .  amitriptyline (ELAVIL) 10 MG tablet, Take 1 tablet at bed time for one week and then increase 2 tablets at bed time, Disp: 60 tablet, Rfl: 3 .  apixaban (ELIQUIS) 5 MG TABS tablet, Take 5 mg by mouth 2 (two) times daily., Disp: , Rfl:  .  atorvastatin (LIPITOR) 80 MG tablet, Take 1 tablet (80 mg total) by mouth daily., Disp: 90 tablet, Rfl: 3 .  carvedilol (COREG) 6.25 MG tablet, Take 1 tablet (6.25 mg total) by mouth 2 (two) times daily with a meal., Disp: 180 tablet, Rfl: 3 .  donepezil (ARICEPT) 10 MG tablet, Take 1 tablet (10 mg total) by mouth at bedtime., Disp: 90 tablet, Rfl: 3 .  fluticasone (FLONASE) 50 MCG/ACT nasal spray, Place 1 spray into both nostrils 2 (two) times daily as needed for allergies or rhinitis., Disp: 16 g, Rfl: 1 .   Insulin Glargine (LANTUS SOLOSTAR) 100 UNIT/ML Solostar Pen, Inject 200 Units into the skin every morning., Disp: 25 pen, Rfl: 11 .  insulin NPH-regular Human (70-30) 100 UNIT/ML injection, Take 120 units in AM and 90 units in PM, Disp: , Rfl:  .  Insulin Syringe-Needle U-100 (INSULIN SYRINGE .3CC/29GX1/2") 29G X 1/2" 0.3 ML MISC, Inject 1 Syringe 3 (three) times daily as directed. Check blood sugar three times daily. Dx: E11.9, Disp: 100 each, Rfl: 3 .  lisinopril (PRINIVIL,ZESTRIL) 10 MG tablet, Take 1 tablet (10 mg total) by mouth at bedtime., Disp: 90 tablet, Rfl: 3 .  spironolactone (ALDACTONE) 25 MG tablet, Take 0.5 tablets (12.5 mg total) by mouth daily., Disp: 45 tablet, Rfl: 3 .  nitroGLYCERIN (NITROSTAT) 0.4 MG SL tablet, Place 1 tablet (0.4 mg total) under the tongue every 5 (five) minutes as needed for chest pain. (Patient not taking: Reported on 12/04/2018), Disp: 30 tablet, Rfl: 1 No Known Allergies    Social History   Socioeconomic History  . Marital status: Single    Spouse name: Not on file  . Number of children: 0  . Years of education: Not on file  . Highest education level: Not on file  Occupational History  . Occupation: Disability  Social Needs  . Financial resource strain: Not on file  . Food insecurity:    Worry: Not on file    Inability: Not on file  . Transportation needs:    Medical: Not on file    Non-medical: Not on file  Tobacco Use  . Smoking status: Never Smoker  . Smokeless tobacco: Never Used  Substance and Sexual Activity  . Alcohol use: No  . Drug use: No  . Sexual activity: Yes    Birth control/protection: None  Lifestyle  . Physical activity:    Days per week: Not on file    Minutes per session: Not on file  . Stress: Not on file  Relationships  . Social connections:    Talks on phone: Not on file    Gets together: Not on file    Attends religious service: Not on file    Active member of club or organization: Not on file    Attends  meetings of clubs or organizations: Not on file    Relationship status: Not on file  . Intimate partner violence:    Fear of current or ex partner: Not on file    Emotionally abused: Not on file    Physically abused: Not on file    Forced sexual activity: Not on file  Other Topics Concern  . Not on file  Social History Narrative   Social History      Diet?       Do you drink/eat things with caffeine? yes      Marital status?       single                             What year were you married?      Do you live in a house, apartment, assisted living, condo, trailer, etc.? yes      Is it one or more stories? One story      How many persons live in your home?      Do you have any pets in your home? (please list) none      Highest level of education completed? graduate      Current or past profession:      Do you exercise?            no                          Type & how often?      Advanced Directives      Do you have a living will? no      Do you have a DNR form?                                  If not, do you want to discuss one? no      Do you have signed POA/HPOA for forms? no      Functional Status      Do you have difficulty bathing or dressing yourself? no      Do you have difficulty preparing food or eating? no      Do you have difficulty managing your medications? no      Do you have difficulty managing your finances? no      Do you have difficulty affording your medications? no    Physical Exam      Future Appointments  Date Time Provider [place]  12/14/2018 11:00 AM Cameron Sprang, MD [facility] None  02/04/2019 11:00 AM [name], NP [facility] None    BP 116/70   Pulse 92   Temp 97.7 F (36.5  C)   Resp 16   Wt 161 lb (73 kg)   SpO2 98%   BMI 27.64 kg/m   Weight yesterday-161 Last visit weight-161 CBG PTA-380  Pt reports feeling great, he was not able to meet yesterday due to car issues.  He has changed from coumadin  to eliquis.  He is still not able to get his insulin due to insurance. He states he is doing a conf call with dr Loanne Drilling this Friday and will let them know then about him not being able to get it.  Pt states he is doing great, no c/p, no dizziness, no sob, sleeping great, no h/a anymore.  No missed doses of his meds, meds verified and pill box refilled.    Marylouise Stacks, [facility] Paramedic  12/12/18

## 2018-12-13 ENCOUNTER — Other Ambulatory Visit: Payer: Self-pay

## 2018-12-13 ENCOUNTER — Telehealth: Payer: Self-pay | Admitting: Endocrinology

## 2018-12-13 DIAGNOSIS — Z794 Long term (current) use of insulin: Secondary | ICD-10-CM

## 2018-12-13 DIAGNOSIS — E1140 Type 2 diabetes mellitus with diabetic neuropathy, unspecified: Secondary | ICD-10-CM

## 2018-12-13 MED ORDER — LANTUS SOLOSTAR 100 UNIT/ML ~~LOC~~ SOPN: 200.0000 [IU] | PEN_INJECTOR | SUBCUTANEOUS | 3 refills | Status: DC

## 2018-12-13 MED ORDER — TOUJEO MAX SOLOSTAR 300 UNIT/ML ~~LOC~~ SOPN: 200.0000 [IU] | PEN_INJECTOR | SUBCUTANEOUS | 2 refills | Status: DC

## 2018-12-13 NOTE — Telephone Encounter (Signed)
[place] to see if new Rx is covered as stated on letter we received via fax.  Spoke with Thurmond Butts, states that the Rx is still not going through, denied d/t high dosing - can only be dosed at a max dose of 80units per dose. Rx is written for 200units daily QAM.   Appeal will have to done to override quantity/dose limitation.  Spoke with Dr. Loanne Drilling. Agreed to change Rx to [facility], same dosage. Rx sent today. Called pt to make aware. LVM requesting returned call.

## 2018-12-13 NOTE — Telephone Encounter (Signed)
I have resent rx for 20 pens per month.  Does not need PA.

## 2018-12-13 NOTE — Telephone Encounter (Signed)
I resent for 20 pens per month, which letter said did not need PA

## 2018-12-13 NOTE — Telephone Encounter (Signed)
[place] to see if new Rx is covered as stated on letter we received via fax.  Spoke with Thurmond Butts, states that the Rx is still not going through, denied d/t high dosing - can only be dosed at a max dose of 80units per dose. Rx is written for 200units daily QAM.   Appeal will have to done to override quantity/dose limitation.

## 2018-12-13 NOTE — Telephone Encounter (Signed)
Insulin Glargine, 2 Unit Dial, (TOUJEO MAX SOLOSTAR) 300 UNIT/ML SOPN 10 pen 2 12/13/2018    Sig - Route: Inject 200 Units into the skin every morning. Dx Code: E11.9 - Subcutaneous   Sent to pharmacy as: Insulin Glargine, 2 Unit Dial, (TOUJEO MAX SOLOSTAR) 300 UNIT/ML Solution Pen-injector   E-Prescribing Status: Receipt confirmed by pharmacy (12/13/2018 11:53 AM EDT)    Hulen Skains pt to provide explanation for Rx change. LVM requesting returned call.

## 2018-12-14 ENCOUNTER — Telehealth: Payer: Medicare HMO | Admitting: Neurology

## 2018-12-14 ENCOUNTER — Other Ambulatory Visit: Payer: Self-pay

## 2018-12-19 ENCOUNTER — Other Ambulatory Visit (HOSPITAL_COMMUNITY): Payer: Self-pay

## 2018-12-19 ENCOUNTER — Telehealth: Payer: Self-pay

## 2018-12-19 NOTE — Telephone Encounter (Signed)
LOV 11/29/18. Per Dr. Loanne Drilling, f/u in 1 week. Called pt to schedule appt. LVM requesting returned call.

## 2018-12-19 NOTE — Progress Notes (Signed)
Paramedicine Encounter    Patient ID: Brazen Domangue, male    DOB: February 21, 1961, 58 y.o.   MRN: [id number]   [name], NP as PCP - General (Geriatric Medicine) [name], MD as PCP - Cardiology (Cardiology) [name], [name], MD as PCP - Advanced Heart Failure (Cardiology) [name], MD as Consulting Physician (Endocrinology)  Patient Active Problem List   Diagnosis Date Noted  . Encounter for therapeutic drug monitoring 10/22/2018  . Nausea and vomiting 03/10/2018  . AF (paroxysmal atrial fibrillation) ([place]) 03/10/2018  . Non-cardiac chest pain 03/09/2018  . Diabetic neuropathy ([place]) 09/26/2017  . MCI (mild cognitive impairment) 06/23/2017  . OSA on CPAP 05/10/2017  . Gastroesophageal reflux disease 05/10/2017  . Insomnia 05/10/2017  . Onychomycosis of multiple toenails with type 2 diabetes mellitus and peripheral neuropathy ([place]) 05/10/2017  . Chronic systolic heart failure ([place]) 11/22/2015  . Essential hypertension 11/22/2015  . DM (diabetes mellitus) ([place]) 11/20/2015  . HLD (hyperlipidemia) 11/20/2015  . Hx of adenomatous colonic polyps 05/19/2011    Current Outpatient Medications:  .  amitriptyline (ELAVIL) 10 MG tablet, Take 1 tablet at bed time for one week and then increase 2 tablets at bed time (Patient taking differently: 20 mg. Take 1 tablet at bed time for one week and then increase 2 tablets at bed time), Disp: 60 tablet, Rfl: 3 .  apixaban (ELIQUIS) 5 MG TABS tablet, Take 5 mg by mouth 2 (two) times daily., Disp: , Rfl:  .  atorvastatin (LIPITOR) 80 MG tablet, Take 1 tablet (80 mg total) by mouth daily., Disp: 90 tablet, Rfl: 3 .  carvedilol (COREG) 6.25 MG tablet, Take 1 tablet (6.25 mg total) by mouth 2 (two) times daily with a meal., Disp: 180 tablet, Rfl: 3 .  donepezil (ARICEPT) 10 MG tablet, Take 1 tablet (10 mg total) by mouth at bedtime., Disp: 90 tablet, Rfl: 3 .  Insulin Syringe-Needle U-100 (INSULIN  SYRINGE .3CC/29GX1/2") 29G X 1/2" 0.3 ML MISC, Inject 1 Syringe 3 (three) times daily as directed. Check blood sugar three times daily. Dx: E11.9, Disp: 100 each, Rfl: 3 .  lisinopril (PRINIVIL,ZESTRIL) 10 MG tablet, Take 1 tablet (10 mg total) by mouth at bedtime., Disp: 90 tablet, Rfl: 3 .  spironolactone (ALDACTONE) 25 MG tablet, Take 0.5 tablets (12.5 mg total) by mouth daily., Disp: 45 tablet, Rfl: 3 .  fluticasone (FLONASE) 50 MCG/ACT nasal spray, Place 1 spray into both nostrils 2 (two) times daily as needed for allergies or rhinitis. (Patient not taking: Reported on 12/19/2018), Disp: 16 g, Rfl: 1 .  Insulin Glargine, 2 Unit Dial, (TOUJEO MAX SOLOSTAR) 300 UNIT/ML SOPN, Inject 200 Units into the skin every morning. Dx Code: E11.9 (Patient not taking: Reported on 12/19/2018), Disp: 10 pen, Rfl: 2 .  nitroGLYCERIN (NITROSTAT) 0.4 MG SL tablet, Place 1 tablet (0.4 mg total) under the tongue every 5 (five) minutes as needed for chest pain. (Patient not taking: Reported on 12/04/2018), Disp: 30 tablet, Rfl: 1 No Known Allergies    Social History   Socioeconomic History  . Marital status: Single    Spouse name: Not on file  . Number of children: 0  . Years of education: Not on file  . Highest education level: Not on file  Occupational History  . Occupation: Disability  Social Needs  . Financial resource strain: Not on file  . Food insecurity    Worry: Not on file    Inability: Not on file  .  Transportation needs    Medical: Not on file    Non-medical: Not on file  Tobacco Use  . Smoking status: Never Smoker  . Smokeless tobacco: Never Used  Substance and Sexual Activity  . Alcohol use: No  . Drug use: No  . Sexual activity: Yes    Birth control/protection: None  Lifestyle  . Physical activity    Days per week: Not on file    Minutes per session: Not on file  . Stress: Not on file  Relationships  . Social Herbalist on phone: Not on file    Gets together: Not on  file    Attends religious service: Not on file    Active member of club or organization: Not on file    Attends meetings of clubs or organizations: Not on file    Relationship status: Not on file  . Intimate partner violence    Fear of current or ex partner: Not on file    Emotionally abused: Not on file    Physically abused: Not on file    Forced sexual activity: Not on file  Other Topics Concern  . Not on file  Social History Narrative   Social History      Diet?       Do you drink/eat things with caffeine? yes      Marital status?       single                             What year were you married?      Do you live in a house, apartment, assisted living, condo, trailer, etc.? yes      Is it one or more stories? One story      How many persons live in your home?      Do you have any pets in your home? (please list) none      Highest level of education completed? graduate      Current or past profession:      Do you exercise?            no                          Type & how often?      Advanced Directives      Do you have a living will? no      Do you have a DNR form?                                  If not, do you want to discuss one? no      Do you have signed POA/HPOA for forms? no      Functional Status      Do you have difficulty bathing or dressing yourself? no      Do you have difficulty preparing food or eating? no      Do you have difficulty managing your medications? no      Do you have difficulty managing your finances? no      Do you have difficulty affording your medications? no    Physical Exam      Future Appointments  Date Time Provider [place]  12/20/2018 10:30 AM Ngetich, Nelda Bucks, NP [facility] None  02/04/2019 11:00 AM [name], NP  [facility] None    BP (!) 150/70   Pulse 100   Temp (!) 97.2 F (36.2 C)   Resp 16   Wt 165 lb (74.8 kg)   SpO2 98%   BMI 28.32 kg/m   Weight yesterday-165 Last visit  weight-161 CBG PTA-343   Pt reports he has been having pains to his rt calf area, this has been going on the past 3-4 days occurs mainly at night time. He states its a sharp pain that shoots through his leg. He states it feels like it is swollen to him and feels tight. No edema to his ankle/foot. He denies increased sob. No dizziness. No c/p.no h/a.  I am concerned worst case scenario a blood clot since he has recently switched from coumadin to eliquis. He missed his neuro appoint and his PCP appoint and needs to [place].  Called PCP to sch visit for his leg pains and was able to sch to be seen at 1030 tomor.  meds verified and pill box refilled. Appetite good and he is sleeping much better. Denies missing any doses of his meds.  Called dr Loanne Drilling office and it was after hours--it was about his insulin being denied from his insurance company.  Called to resch his neuro appoint but it was after hours as well. I left pt the number to call tomor.   Marylouise Stacks, [facility] Paramedic  12/19/18

## 2018-12-20 ENCOUNTER — Encounter: Payer: Self-pay | Admitting: Family

## 2018-12-20 ENCOUNTER — Ambulatory Visit (INDEPENDENT_AMBULATORY_CARE_PROVIDER_SITE_OTHER): Payer: Medicare HMO | Admitting: Family

## 2018-12-20 ENCOUNTER — Ambulatory Visit
Admission: RE | Admit: 2018-12-20 | Discharge: 2018-12-20 | Disposition: A | Discharge status: Home / Self Care | Payer: Medicare HMO | Source: Ambulatory Visit | Attending: Family | Admitting: Family

## 2018-12-20 ENCOUNTER — Other Ambulatory Visit: Payer: Self-pay

## 2018-12-20 VITALS — BP 138/68 | HR 79 | Temp 97.6°F | Ht 64.0 in | Wt 166.0 lb

## 2018-12-20 DIAGNOSIS — L602 Onychogryphosis: Secondary | ICD-10-CM | POA: Insufficient documentation

## 2018-12-20 DIAGNOSIS — M79604 Pain in right leg: Secondary | ICD-10-CM

## 2018-12-20 DIAGNOSIS — Z794 Long term (current) use of insulin: Secondary | ICD-10-CM

## 2018-12-20 DIAGNOSIS — R2681 Unsteadiness on feet: Secondary | ICD-10-CM | POA: Diagnosis not present

## 2018-12-20 DIAGNOSIS — M1611 Unilateral primary osteoarthritis, right hip: Secondary | ICD-10-CM | POA: Diagnosis not present

## 2018-12-20 DIAGNOSIS — E1140 Type 2 diabetes mellitus with diabetic neuropathy, unspecified: Secondary | ICD-10-CM

## 2018-12-20 HISTORY — DX: Unsteadiness on feet: R26.81

## 2018-12-20 NOTE — Progress Notes (Signed)
Provider: Agnieszka Newhouse FNP-C  [name], NP  [name], NP as PCP - General (Geriatric Medicine) [name], MD as PCP - Cardiology (Cardiology) [name], [name], MD as PCP - Advanced Heart Failure (Cardiology) [name], MD as Consulting Physician (Endocrinology)  Extended Emergency Contact Information Primary Emergency Contact: [facility] Address: [address]          [place], [place] [address] [place] Phone: [phone] Mobile Phone: [phone] Relation: Sister Secondary Emergency Contact: Herring,Martha Address: [place], [place] [address] [place] Phone: [phone] Relation: Other   Goals of care: Advanced Directive information Advanced Directives 08/06/2018  Does Patient Have a Medical Advance Directive? No  Type of Advance Directive -  Does patient want to make changes to medical advance directive? -  Copy of [place] in Chart? -  Would patient like information on creating a medical advance directive? Yes (ED - Information included in AVS)     Chief Complaint  Patient presents with   Acute Visit    Right Leg swelling duration of 5 to 6 days and pain and medication concerns, patient also fell coming into office but states he was not injured     HPI:  Pt is a 58 y.o. male seen today for an acute visit for evaluation of right leg swelling.He follows up with PCP Sherrie Mustache NP.He is new to me.Patient complains of pain on right inner/back thigh and calf muscle for which has been present for 5 to 6 days.Pain is described as sharp ache and is 6-8 on scale of 10.Associated symptoms include leg giving out while walking.He has not taken any pain relievers.Nothing relieves the pain but worsen with touching and walking.He denies any injuries to leg.He states right leg seems more swollen than the left leg.He states fell on the  parking lot while coming into the office but was able to support himself on a tree.He did not fall to the ground.He states no injuries " right leg just gave out". He denies any lower back pain,weakness,numbness or tingling on legs. He states his eyes sight has worsen seems to be blurry.He states last saw an ophthalmology one year ago requires reading glasses.He has a significant medical history of type 2 DM with neuropathy.He states blood sugars are uncontrolled sometimes in the 300's.No log brought in for visit.He takes Toujeo 200 units in the morning.unclear what insulin he takes in the evening.    Past Medical History:  Diagnosis Date   Adenoidal hypertrophy    Adenomatous colon polyps 2012   Arthritis    Diabetes mellitus    GERD (gastroesophageal reflux disease)    Heart attack ([place])    While living in [place]   Hx of adenomatous colonic polyps 08/18/2017   Hyperlipidemia    Hypertension    Mild CAD    a. cath in 08/2017 showing mild nonobstructive CAD with scattered 20-30% stenosis.    Nonischemic cardiomyopathy ([place])    a. EF 20-25% by echo in 09/2017 with cath showing mild CAD. b.  Last echo 12/2017 EF 35-40%, grade 2 DD.   Obesity    PAF (paroxysmal atrial fibrillation) ([place])    Sleep apnea    cpap   Past Surgical History:  Procedure Laterality Date   COLONOSCOPY  05/13/11   9 adenomas   FOREARM SURGERY     MUSCLE BIOPSY  RIGHT/LEFT HEART CATH AND CORONARY ANGIOGRAPHY N/A 08/25/2017   Procedure: RIGHT/LEFT HEART CATH AND CORONARY ANGIOGRAPHY;  Surgeon: [name], MD;  Location: [facility];  Service: Cardiovascular;  Laterality: N/A;    No Known Allergies  Outpatient Encounter Medications as of 12/20/2018  Medication Sig   amitriptyline (ELAVIL) 10 MG tablet Take 1 tablet at bed time for one week and then increase 2 tablets at bed time (Patient taking differently: 20 mg. Take 1 tablet at bed time for one week and then increase 2  tablets at bed time)   apixaban (ELIQUIS) 5 MG TABS tablet Take 5 mg by mouth 2 (two) times daily.   atorvastatin (LIPITOR) 80 MG tablet Take 1 tablet (80 mg total) by mouth daily.   carvedilol (COREG) 6.25 MG tablet Take 1 tablet (6.25 mg total) by mouth 2 (two) times daily with a meal.   donepezil (ARICEPT) 10 MG tablet Take 1 tablet (10 mg total) by mouth at bedtime.   Insulin Glargine, 2 Unit Dial, (TOUJEO MAX SOLOSTAR) 300 UNIT/ML SOPN Inject 200 Units into the skin every morning. Dx Code: E11.9   Insulin Syringe-Needle U-100 (INSULIN SYRINGE .3CC/29GX1/2") 29G X 1/2" 0.3 ML MISC Inject 1 Syringe 3 (three) times daily as directed. Check blood sugar three times daily. Dx: E11.9   lisinopril (PRINIVIL,ZESTRIL) 10 MG tablet Take 1 tablet (10 mg total) by mouth at bedtime.   nitroGLYCERIN (NITROSTAT) 0.4 MG SL tablet Place 1 tablet (0.4 mg total) under the tongue every 5 (five) minutes as needed for chest pain.   spironolactone (ALDACTONE) 25 MG tablet Take 0.5 tablets (12.5 mg total) by mouth daily.   [DISCONTINUED] fluticasone (FLONASE) 50 MCG/ACT nasal spray Place 1 spray into both nostrils 2 (two) times daily as needed for allergies or rhinitis. (Patient not taking: Reported on 12/19/2018)   No facility-administered encounter medications on file as of 12/20/2018.     Review of Systems  Eyes: Positive for visual disturbance. Negative for discharge, redness and itching.       Blurry vision.  Respiratory: Negative for cough, chest tightness, shortness of breath and wheezing.   Cardiovascular: Positive for leg swelling. Negative for chest pain and palpitations.  Gastrointestinal: Negative for abdominal distention, abdominal pain, constipation, nausea and vomiting.  Musculoskeletal: Positive for gait problem.       Right leg   Neurological: Negative for speech difficulty, weakness and numbness.       Chronic dizziness and light-headedness     Immunization History  Administered  Date(s) Administered   Influenza,inj,Quad PF,6+ Mos 05/10/2017   Influenza-Unspecified 04/02/2014   Pneumococcal Conjugate-13 06/20/2017   Tdap 09/28/2015   Pertinent  Health Maintenance Due  Topic Date Due   OPHTHALMOLOGY EXAM  04/12/1970   INFLUENZA VACCINE  02/02/2019   HEMOGLOBIN A1C  05/18/2019   FOOT EXAM  09/12/2019   COLONOSCOPY  08/10/2020   Fall Risk  12/20/2018 08/06/2018 05/09/2018 04/26/2018 04/09/2018  Falls in the past year? 1 0 0 No No  Number falls in past yr: 0 0 0 - -  Injury with Fall? 0 0 0 - -    Vitals:   12/20/18 1029  BP: 138/68  Pulse: 79  Temp: 97.6 F (36.4 C)  TempSrc: Oral  SpO2: 99%  Weight: 166 lb (75.3 kg)  Height: 5' 4"  (1.626 m)   Body mass index is 28.49 kg/m. Physical Exam Vitals signs reviewed.  Constitutional:      General: He is not in acute distress.  Appearance: He is overweight. He is not ill-appearing.  HENT:     Head: Normocephalic.     Nose: No congestion or rhinorrhea.     Mouth/Throat:     Mouth: Mucous membranes are moist.     Pharynx: Oropharynx is clear. No oropharyngeal exudate or posterior oropharyngeal erythema.  Neck:     Musculoskeletal: Normal range of motion. No muscular tenderness.  Cardiovascular:     Rate and Rhythm: Normal rate and regular rhythm.     Pulses: Normal pulses.     Heart sounds: Murmur present. No friction rub. No gallop.   Pulmonary:     Effort: Pulmonary effort is normal. No respiratory distress.     Breath sounds: Normal breath sounds. No wheezing, rhonchi or rales.  Chest:     Chest wall: No tenderness.  Abdominal:     General: Bowel sounds are normal. There is no distension.     Palpations: Abdomen is soft. There is no mass.     Tenderness: There is no abdominal tenderness. There is no right CVA tenderness, left CVA tenderness, guarding or rebound.  Musculoskeletal: Normal range of motion.        General: No swelling.     Right lower leg: No edema.     Left lower leg: No  edema.     Comments: Unsteady gait.right inner thigh and calf muscle tender to palpation.No redness or swelling noted.bilateral calf muscle measures the same.   Lymphadenopathy:     Cervical: No cervical adenopathy.  Skin:    General: Skin is warm and dry.     Coloration: Skin is not pale.     Findings: No bruising, erythema or rash.     Comments: Bilateral overgrown toenails   Neurological:     Mental Status: He is alert and oriented to person, place, and time.     Cranial Nerves: No cranial nerve deficit.     Sensory: Sensory deficit present.     Motor: No weakness, tremor, atrophy, abnormal muscle tone or pronator drift.     Coordination: Coordination normal. Finger-Nose-Finger Test normal.     Gait: Gait abnormal.     Comments: Romberg test and Heel to shin not performed due to right leg pain and unsteady gait.decreased sensation to both feet with filament pricking.   Psychiatric:        Mood and Affect: Mood normal.        Speech: Speech normal.        Behavior: Behavior normal.        Thought Content: Thought content normal.        Judgment: Judgment normal.     Labs reviewed: Recent Labs    03/09/18 1849  07/16/18 1928 08/27/18 1434 09/10/18 1015  NA  --    < > 138 134* 135  K  --    < > 3.8 4.3 4.2  CL  --    < > 103 100 103  CO2  --    < > 24 24 26   GLUCOSE  --    < > 219* 316* 260*  BUN  --    < > 11 9 13   CREATININE 1.14   < > 1.45* 1.22 1.29*  CALCIUM  --    < > 9.6 8.8* 9.2  MG 1.9  --   --   --   --   PHOS 3.5  --   --   --   --    < > =  values in this interval not displayed.   Recent Labs    03/09/18 0949 06/15/18 1033  AST 18 16  ALT 18 17  ALKPHOS 72 82  BILITOT 1.0 0.3  PROT 6.4* 6.3  ALBUMIN 3.7 4.2   Recent Labs    01/25/18 2245  03/09/18 1849 03/10/18 0345 07/16/18 1928  WBC 5.7   < > 7.8 7.5 6.7  NEUTROABS 3.5  --   --   --   --   HGB 15.4   < > 15.7 14.6 15.4  HCT 46.0   < > 47.3 42.9 45.6  MCV 90.9   < > 90.6 89.6 89.9  PLT  191   < > 229 212 222   < > = values in this interval not displayed.   Lab Results  Component Value Date   TSH 1.362 10/15/2017   Lab Results  Component Value Date   HGBA1C 11.1 (A) 11/15/2018   Lab Results  Component Value Date   CHOL 244 (H) 06/15/2018   HDL 53 06/15/2018   LDLCALC 177 (H) 06/15/2018   TRIG 72 06/15/2018   CHOLHDL 4.6 06/15/2018    Significant Diagnostic Results in last 30 days:  No results found.  Assessment/Plan 1. Right leg pain Tenderness elicited with palpation on right inner thigh and calf muscle. - US Venous Img Lower Unilateral Right; Future - DG FEMUR, MIN 2 VIEWS RIGHT; Future - Ambulatory referral to Physical Therapy - Extra strength tylenol 500 mg tablet one by mouth every 6 hours as needed for right leg pain   2. Overgrown toenails Bilateral overgrown toenails. - Ambulatory referral to Podiatry to trim toenails.   3. Type 2 diabetes mellitus with diabetic neuropathy, with long-term current use of insulin ([place]) Continue current medication.Record CBG and notify provider if > 200. - Ambulatory referral to Podiatry for annual foot exam - Ambulatory referral to Ophthalmology for annual eye exam.   4. Unsteady gait Reports right leg giving out and fall episode on parking lot while coming to visit today.No injuries sustained.  - Ambulatory referral to Physical Therapy for evaluation of gait,muscle strengthening and exercise.  - Fall precaution discussed with patient verbalized understanding.   Family/ staff Communication: Reviewed plan of care with patient.   Labs/tests ordered:  - US Venous Img Lower Unilateral Right; Future - DG FEMUR, MIN 2 VIEWS RIGHT; Future  Sandrea Hughs, NP

## 2018-12-20 NOTE — Patient Instructions (Signed)
May take Extra strength tylenol 500 mg tablet one by mouth every 6 hours as needed for right leg pain

## 2018-12-26 ENCOUNTER — Encounter

## 2018-12-26 ENCOUNTER — Ambulatory Visit: Payer: Medicare HMO | Admitting: Neurology

## 2018-12-27 ENCOUNTER — Other Ambulatory Visit (HOSPITAL_COMMUNITY): Payer: Self-pay

## 2018-12-27 NOTE — Progress Notes (Signed)
Paramedicine Encounter    Patient ID: Calvin [name], male    DOB: January 12, 1961, 58 y.o.   MRN: [id number]   [name], NP as PCP - General (Geriatric Medicine) [name], MD as PCP - Cardiology (Cardiology) [name], [name], MD as PCP - Advanced Heart Failure (Cardiology) [name], MD as Consulting Physician (Endocrinology)  Patient Active Problem List   Diagnosis Date Noted  . Right leg pain 12/20/2018  . Overgrown toenails 12/20/2018  . Unsteady gait 12/20/2018  . Encounter for therapeutic drug monitoring 10/22/2018  . Nausea and vomiting 03/10/2018  . AF (paroxysmal atrial fibrillation) ([place]) 03/10/2018  . Non-cardiac chest pain 03/09/2018  . Diabetic neuropathy ([place]) 09/26/2017  . MCI (mild cognitive impairment) 06/23/2017  . OSA on CPAP 05/10/2017  . Gastroesophageal reflux disease 05/10/2017  . Insomnia 05/10/2017  . Onychomycosis of multiple toenails with type 2 diabetes mellitus and peripheral neuropathy ([place]) 05/10/2017  . Chronic systolic heart failure ([place]) 11/22/2015  . Essential hypertension 11/22/2015  . DM (diabetes mellitus) ([place]) 11/20/2015  . HLD (hyperlipidemia) 11/20/2015  . Hx of adenomatous colonic polyps 05/19/2011    Current Outpatient Medications:  .  amitriptyline (ELAVIL) 10 MG tablet, Take 1 tablet at bed time for one week and then increase 2 tablets at bed time (Patient taking differently: 20 mg. Take 1 tablet at bed time for one week and then increase 2 tablets at bed time), Disp: 60 tablet, Rfl: 3 .  apixaban (ELIQUIS) 5 MG TABS tablet, Take 5 mg by mouth 2 (two) times daily., Disp: , Rfl:  .  atorvastatin (LIPITOR) 80 MG tablet, Take 1 tablet (80 mg total) by mouth daily., Disp: 90 tablet, Rfl: 3 .  carvedilol (COREG) 6.25 MG tablet, Take 1 tablet (6.25 mg total) by mouth 2 (two) times daily with a meal., Disp: 180 tablet, Rfl: 3 .  donepezil (ARICEPT) 10 MG tablet, Take 1 tablet (10 mg total)  by mouth at bedtime., Disp: 90 tablet, Rfl: 3 .  Insulin Syringe-Needle U-100 (INSULIN SYRINGE .3CC/29GX1/2") 29G X 1/2" 0.3 ML MISC, Inject 1 Syringe 3 (three) times daily as directed. Check blood sugar three times daily. Dx: E11.9, Disp: 100 each, Rfl: 3 .  lisinopril (PRINIVIL,ZESTRIL) 10 MG tablet, Take 1 tablet (10 mg total) by mouth at bedtime., Disp: 90 tablet, Rfl: 3 .  spironolactone (ALDACTONE) 25 MG tablet, Take 0.5 tablets (12.5 mg total) by mouth daily., Disp: 45 tablet, Rfl: 3 .  Insulin Glargine, 2 Unit Dial, (TOUJEO MAX SOLOSTAR) 300 UNIT/ML SOPN, Inject 200 Units into the skin every morning. Dx Code: E11.9 (Patient not taking: Reported on 12/27/2018), Disp: 10 pen, Rfl: 2 .  nitroGLYCERIN (NITROSTAT) 0.4 MG SL tablet, Place 1 tablet (0.4 mg total) under the tongue every 5 (five) minutes as needed for chest pain. (Patient not taking: Reported on 12/27/2018), Disp: 30 tablet, Rfl: 1 No Known Allergies    Social History   Socioeconomic History  . Marital status: Single    Spouse name: Not on file  . Number of children: 0  . Years of education: Not on file  . Highest education level: Not on file  Occupational History  . Occupation: Disability  Social Needs  . Financial resource strain: Not on file  . Food insecurity    Worry: Not on file    Inability: Not on file  . Transportation needs    Medical: Not on file    Non-medical: Not on file  Tobacco Use  .  Smoking status: Never Smoker  . Smokeless tobacco: Never Used  Substance and Sexual Activity  . Alcohol use: No  . Drug use: No  . Sexual activity: Yes    Birth control/protection: None  Lifestyle  . Physical activity    Days per week: Not on file    Minutes per session: Not on file  . Stress: Not on file  Relationships  . Social Herbalist on phone: Not on file    Gets together: Not on file    Attends religious service: Not on file    Active member of club or organization: Not on file    Attends  meetings of clubs or organizations: Not on file    Relationship status: Not on file  . Intimate partner violence    Fear of current or ex partner: Not on file    Emotionally abused: Not on file    Physically abused: Not on file    Forced sexual activity: Not on file  Other Topics Concern  . Not on file  Social History Narrative   Social History      Diet?       Do you drink/eat things with caffeine? yes      Marital status?       single                             What year were you married?      Do you live in a house, apartment, assisted living, condo, trailer, etc.? yes      Is it one or more stories? One story      How many persons live in your home?      Do you have any pets in your home? (please list) none      Highest level of education completed? graduate      Current or past profession:      Do you exercise?            no                          Type & how often?      Advanced Directives      Do you have a living will? no      Do you have a DNR form?                                  If not, do you want to discuss one? no      Do you have signed POA/HPOA for forms? no      Functional Status      Do you have difficulty bathing or dressing yourself? no      Do you have difficulty preparing food or eating? no      Do you have difficulty managing your medications? no      Do you have difficulty managing your finances? no      Do you have difficulty affording your medications? no    Physical Exam      Future Appointments  Date Time Provider [place]  01/01/2019 11:45 AM Debbe Odea, PT [facility]  01/18/2019  3:00 PM Marzetta Board, DPM [facility]  02/04/2019 11:00 AM [name], NP [facility] None    BP 138/80   Pulse  99   Temp 97.9 F (36.6 C)   Resp 16   Wt 165 lb (74.8 kg)   SpO2 99%   BMI 28.32 kg/m   Weight yesterday-165 Last visit weight-165 CBG PTA-365  Pt reports doing ok, he is still having  f/u done on that rt leg pain.  His CBG's still elevated. His insurance still not covering his insulin. Will ask jenna for further assistance for this.  Pt denies c/p, no sob, sleeping ok, headaches off and on. Appetite not too well. meds verified and pill box refilled.   Marylouise Stacks, [place] [facility] Paramedic  12/27/18

## 2019-01-01 ENCOUNTER — Ambulatory Visit: Payer: Medicare HMO | Attending: Family | Admitting: Physical Therapy

## 2019-01-02 ENCOUNTER — Other Ambulatory Visit (HOSPITAL_COMMUNITY): Payer: Self-pay

## 2019-01-02 NOTE — Progress Notes (Signed)
Paramedicine Encounter    Patient ID: Calvin [name], male    DOB: Dec 09, 1960, 58 y.o.   MRN: [id number]   [name], NP as PCP - General (Geriatric Medicine) [name], MD as PCP - Cardiology (Cardiology) [name], [name], MD as PCP - Advanced Heart Failure (Cardiology) [name], MD as Consulting Physician (Endocrinology)  Patient Active Problem List   Diagnosis Date Noted  . Right leg pain 12/20/2018  . Overgrown toenails 12/20/2018  . Unsteady gait 12/20/2018  . Encounter for therapeutic drug monitoring 10/22/2018  . Nausea and vomiting 03/10/2018  . AF (paroxysmal atrial fibrillation) ([place]) 03/10/2018  . Non-cardiac chest pain 03/09/2018  . Diabetic neuropathy ([place]) 09/26/2017  . MCI (mild cognitive impairment) 06/23/2017  . OSA on CPAP 05/10/2017  . Gastroesophageal reflux disease 05/10/2017  . Insomnia 05/10/2017  . Onychomycosis of multiple toenails with type 2 diabetes mellitus and peripheral neuropathy ([place]) 05/10/2017  . Chronic systolic heart failure ([place]) 11/22/2015  . Essential hypertension 11/22/2015  . DM (diabetes mellitus) ([place]) 11/20/2015  . HLD (hyperlipidemia) 11/20/2015  . Hx of adenomatous colonic polyps 05/19/2011    Current Outpatient Medications:  .  amitriptyline (ELAVIL) 10 MG tablet, Take 1 tablet at bed time for one week and then increase 2 tablets at bed time (Patient taking differently: 20 mg. Take 1 tablet at bed time for one week and then increase 2 tablets at bed time), Disp: 60 tablet, Rfl: 3 .  apixaban (ELIQUIS) 5 MG TABS tablet, Take 5 mg by mouth 2 (two) times daily., Disp: , Rfl:  .  atorvastatin (LIPITOR) 80 MG tablet, Take 1 tablet (80 mg total) by mouth daily., Disp: 90 tablet, Rfl: 3 .  carvedilol (COREG) 6.25 MG tablet, Take 1 tablet (6.25 mg total) by mouth 2 (two) times daily with a meal., Disp: 180 tablet, Rfl: 3 .  donepezil (ARICEPT) 10 MG tablet, Take 1 tablet (10 mg total)  by mouth at bedtime., Disp: 90 tablet, Rfl: 3 .  Insulin Syringe-Needle U-100 (INSULIN SYRINGE .3CC/29GX1/2") 29G X 1/2" 0.3 ML MISC, Inject 1 Syringe 3 (three) times daily as directed. Check blood sugar three times daily. Dx: E11.9, Disp: 100 each, Rfl: 3 .  lisinopril (PRINIVIL,ZESTRIL) 10 MG tablet, Take 1 tablet (10 mg total) by mouth at bedtime., Disp: 90 tablet, Rfl: 3 .  spironolactone (ALDACTONE) 25 MG tablet, Take 0.5 tablets (12.5 mg total) by mouth daily., Disp: 45 tablet, Rfl: 3 .  Insulin Glargine, 2 Unit Dial, (TOUJEO MAX SOLOSTAR) 300 UNIT/ML SOPN, Inject 200 Units into the skin every morning. Dx Code: E11.9 (Patient not taking: Reported on 12/27/2018), Disp: 10 pen, Rfl: 2 .  nitroGLYCERIN (NITROSTAT) 0.4 MG SL tablet, Place 1 tablet (0.4 mg total) under the tongue every 5 (five) minutes as needed for chest pain. (Patient not taking: Reported on 12/27/2018), Disp: 30 tablet, Rfl: 1 No Known Allergies    Social History   Socioeconomic History  . Marital status: Single    Spouse name: Not on file  . Number of children: 0  . Years of education: Not on file  . Highest education level: Not on file  Occupational History  . Occupation: Disability  Social Needs  . Financial resource strain: Not on file  . Food insecurity    Worry: Not on file    Inability: Not on file  . Transportation needs    Medical: Not on file    Non-medical: Not on file  Tobacco Use  .  Smoking status: Never Smoker  . Smokeless tobacco: Never Used  Substance and Sexual Activity  . Alcohol use: No  . Drug use: No  . Sexual activity: Yes    Birth control/protection: None  Lifestyle  . Physical activity    Days per week: Not on file    Minutes per session: Not on file  . Stress: Not on file  Relationships  . Social Herbalist on phone: Not on file    Gets together: Not on file    Attends religious service: Not on file    Active member of club or organization: Not on file    Attends  meetings of clubs or organizations: Not on file    Relationship status: Not on file  . Intimate partner violence    Fear of current or ex partner: Not on file    Emotionally abused: Not on file    Physically abused: Not on file    Forced sexual activity: Not on file  Other Topics Concern  . Not on file  Social History Narrative   Social History      Diet?       Do you drink/eat things with caffeine? yes      Marital status?       single                             What year were you married?      Do you live in a house, apartment, assisted living, condo, trailer, etc.? yes      Is it one or more stories? One story      How many persons live in your home?      Do you have any pets in your home? (please list) none      Highest level of education completed? graduate      Current or past profession:      Do you exercise?            no                          Type & how often?      Advanced Directives      Do you have a living will? no      Do you have a DNR form?                                  If not, do you want to discuss one? no      Do you have signed POA/HPOA for forms? no      Functional Status      Do you have difficulty bathing or dressing yourself? no      Do you have difficulty preparing food or eating? no      Do you have difficulty managing your medications? no      Do you have difficulty managing your finances? no      Do you have difficulty affording your medications? no    Physical Exam      Future Appointments  Date Time Provider [place]  01/18/2019  3:00 PM Marzetta Board, DPM [facility]  02/04/2019 11:00 AM [name], NP [facility] None    BP 134/70   Pulse 98   Temp (!) 97.3 F (36.3 C)  Resp 18   Wt 165 lb (74.8 kg)   SpO2 99%   BMI 28.32 kg/m   Weight yesterday-165  Last visit weight-165  Pt reports he is doing ok, [place] contacted his doc managing his diabetes to assist with the issue of  getting his toujeo.  She was unable to reach him, so I relayed this info to him today. He will call to get an appointment.  No missed doses of his meds. meds verified and pill box refilled.  Ordered his eliquis from [facility],  Ordered from [facility]-carvedilol, lisinopril, donepezil.  Order confirmation #[id number] He denies c/p, no increased sob, his leg is still hurting and is being f.u on that. No dizziness. He does state sometimes he trips due to his leg giving out that is causing him pain.  No edema, lungs clear.   Marylouise Stacks, [place] [facility] Paramedic  01/02/19

## 2019-01-09 ENCOUNTER — Other Ambulatory Visit (HOSPITAL_COMMUNITY): Payer: Self-pay

## 2019-01-09 NOTE — Progress Notes (Signed)
Paramedicine Encounter    Patient ID: Calvin [name], male    DOB: June 11, 1961, 58 y.o.   MRN: [id number]   [name], NP as PCP - General (Geriatric Medicine) [name], MD as PCP - Cardiology (Cardiology) [name], [name], MD as PCP - Advanced Heart Failure (Cardiology) [name], MD as Consulting Physician (Endocrinology)  Patient Active Problem List   Diagnosis Date Noted  . Right leg pain 12/20/2018  . Overgrown toenails 12/20/2018  . Unsteady gait 12/20/2018  . Encounter for therapeutic drug monitoring 10/22/2018  . Nausea and vomiting 03/10/2018  . AF (paroxysmal atrial fibrillation) ([place]) 03/10/2018  . Non-cardiac chest pain 03/09/2018  . Diabetic neuropathy ([place]) 09/26/2017  . MCI (mild cognitive impairment) 06/23/2017  . OSA on CPAP 05/10/2017  . Gastroesophageal reflux disease 05/10/2017  . Insomnia 05/10/2017  . Onychomycosis of multiple toenails with type 2 diabetes mellitus and peripheral neuropathy ([place]) 05/10/2017  . Chronic systolic heart failure ([place]) 11/22/2015  . Essential hypertension 11/22/2015  . DM (diabetes mellitus) ([place]) 11/20/2015  . HLD (hyperlipidemia) 11/20/2015  . Hx of adenomatous colonic polyps 05/19/2011    Current Outpatient Medications:  .  amitriptyline (ELAVIL) 10 MG tablet, Take 1 tablet at bed time for one week and then increase 2 tablets at bed time (Patient taking differently: 20 mg. Take 1 tablet at bed time for one week and then increase 2 tablets at bed time), Disp: 60 tablet, Rfl: 3 .  apixaban (ELIQUIS) 5 MG TABS tablet, Take 5 mg by mouth 2 (two) times daily., Disp: , Rfl:  .  atorvastatin (LIPITOR) 80 MG tablet, Take 1 tablet (80 mg total) by mouth daily., Disp: 90 tablet, Rfl: 3 .  carvedilol (COREG) 6.25 MG tablet, Take 1 tablet (6.25 mg total) by mouth 2 (two) times daily with a meal., Disp: 180 tablet, Rfl: 3 .  donepezil (ARICEPT) 10 MG tablet, Take 1 tablet (10 mg total)  by mouth at bedtime., Disp: 90 tablet, Rfl: 3 .  Insulin Glargine, 2 Unit Dial, (TOUJEO MAX SOLOSTAR) 300 UNIT/ML SOPN, Inject 200 Units into the skin every morning. Dx Code: E11.9 (Patient not taking: Reported on 12/27/2018), Disp: 10 pen, Rfl: 2 .  Insulin Syringe-Needle U-100 (INSULIN SYRINGE .3CC/29GX1/2") 29G X 1/2" 0.3 ML MISC, Inject 1 Syringe 3 (three) times daily as directed. Check blood sugar three times daily. Dx: E11.9, Disp: 100 each, Rfl: 3 .  lisinopril (PRINIVIL,ZESTRIL) 10 MG tablet, Take 1 tablet (10 mg total) by mouth at bedtime., Disp: 90 tablet, Rfl: 3 .  nitroGLYCERIN (NITROSTAT) 0.4 MG SL tablet, Place 1 tablet (0.4 mg total) under the tongue every 5 (five) minutes as needed for chest pain. (Patient not taking: Reported on 12/27/2018), Disp: 30 tablet, Rfl: 1 .  spironolactone (ALDACTONE) 25 MG tablet, Take 0.5 tablets (12.5 mg total) by mouth daily., Disp: 45 tablet, Rfl: 3 No Known Allergies    Social History   Socioeconomic History  . Marital status: Single    Spouse name: Not on file  . Number of children: 0  . Years of education: Not on file  . Highest education level: Not on file  Occupational History  . Occupation: Disability  Social Needs  . Financial resource strain: Not on file  . Food insecurity    Worry: Not on file    Inability: Not on file  . Transportation needs    Medical: Not on file    Non-medical: Not on file  Tobacco Use  .  Smoking status: Never Smoker  . Smokeless tobacco: Never Used  Substance and Sexual Activity  . Alcohol use: No  . Drug use: No  . Sexual activity: Yes    Birth control/protection: None  Lifestyle  . Physical activity    Days per week: Not on file    Minutes per session: Not on file  . Stress: Not on file  Relationships  . Social Herbalist on phone: Not on file    Gets together: Not on file    Attends religious service: Not on file    Active member of club or organization: Not on file    Attends  meetings of clubs or organizations: Not on file    Relationship status: Not on file  . Intimate partner violence    Fear of current or ex partner: Not on file    Emotionally abused: Not on file    Physically abused: Not on file    Forced sexual activity: Not on file  Other Topics Concern  . Not on file  Social History Narrative   Social History      Diet?       Do you drink/eat things with caffeine? yes      Marital status?       single                             What year were you married?      Do you live in a house, apartment, assisted living, condo, trailer, etc.? yes      Is it one or more stories? One story      How many persons live in your home?      Do you have any pets in your home? (please list) none      Highest level of education completed? graduate      Current or past profession:      Do you exercise?            no                          Type & how often?      Advanced Directives      Do you have a living will? no      Do you have a DNR form?                                  If not, do you want to discuss one? no      Do you have signed POA/HPOA for forms? no      Functional Status      Do you have difficulty bathing or dressing yourself? no      Do you have difficulty preparing food or eating? no      Do you have difficulty managing your medications? no      Do you have difficulty managing your finances? no      Do you have difficulty affording your medications? no    Physical Exam      Future Appointments  Date Time Provider [place]  01/18/2019  3:00 PM Marzetta Board, DPM [facility]  02/04/2019 11:00 AM [name], NP [facility] None    BP 136/78   Pulse 98   Temp 97.9 F (36.6 C)  Resp 16   Wt 166 lb (75.3 kg)   SpO2 98%   BMI 28.49 kg/m   Weight yesterday-165 Last visit weight-165 CBG PTA-345  Pt reports he is doing well, he denies increased sob, no dizziness, no h/a, sleeping ok,  appetite ok. meds verified and pill box refilled. No edema noted.  CBG's are still running elevated-he has tried to contact dr. Loanne Drilling many times but unable to reach them. Advised him to keep calling.   Marylouise Stacks, [place] [facility] Paramedic  01/09/19

## 2019-01-16 ENCOUNTER — Other Ambulatory Visit (HOSPITAL_COMMUNITY): Payer: Self-pay

## 2019-01-16 NOTE — Progress Notes (Signed)
Paramedicine Encounter    Patient ID: Calvin [name], male    DOB: 1961-02-22, 58 y.o.   MRN: [id number]   [name], NP as PCP - General (Geriatric Medicine) [name], MD as PCP - Cardiology (Cardiology) [name], [name], MD as PCP - Advanced Heart Failure (Cardiology) [name], MD as Consulting Physician (Endocrinology)  Patient Active Problem List   Diagnosis Date Noted  . Right leg pain 12/20/2018  . Overgrown toenails 12/20/2018  . Unsteady gait 12/20/2018  . Encounter for therapeutic drug monitoring 10/22/2018  . Nausea and vomiting 03/10/2018  . AF (paroxysmal atrial fibrillation) ([place]) 03/10/2018  . Non-cardiac chest pain 03/09/2018  . Diabetic neuropathy ([place]) 09/26/2017  . MCI (mild cognitive impairment) 06/23/2017  . OSA on CPAP 05/10/2017  . Gastroesophageal reflux disease 05/10/2017  . Insomnia 05/10/2017  . Onychomycosis of multiple toenails with type 2 diabetes mellitus and peripheral neuropathy ([place]) 05/10/2017  . Chronic systolic heart failure ([place]) 11/22/2015  . Essential hypertension 11/22/2015  . DM (diabetes mellitus) ([place]) 11/20/2015  . HLD (hyperlipidemia) 11/20/2015  . Hx of adenomatous colonic polyps 05/19/2011    Current Outpatient Medications:  .  amitriptyline (ELAVIL) 10 MG tablet, Take 1 tablet at bed time for one week and then increase 2 tablets at bed time (Patient taking differently: 20 mg. Take 1 tablet at bed time for one week and then increase 2 tablets at bed time), Disp: 60 tablet, Rfl: 3 .  apixaban (ELIQUIS) 5 MG TABS tablet, Take 5 mg by mouth 2 (two) times daily., Disp: , Rfl:  .  atorvastatin (LIPITOR) 80 MG tablet, Take 1 tablet (80 mg total) by mouth daily., Disp: 90 tablet, Rfl: 3 .  carvedilol (COREG) 6.25 MG tablet, Take 1 tablet (6.25 mg total) by mouth 2 (two) times daily with a meal., Disp: 180 tablet, Rfl: 3 .  donepezil (ARICEPT) 10 MG tablet, Take 1 tablet (10 mg total)  by mouth at bedtime., Disp: 90 tablet, Rfl: 3 .  Insulin Glargine, 2 Unit Dial, (TOUJEO MAX SOLOSTAR) 300 UNIT/ML SOPN, Inject 200 Units into the skin every morning. Dx Code: E11.9 (Patient not taking: Reported on 12/27/2018), Disp: 10 pen, Rfl: 2 .  Insulin Syringe-Needle U-100 (INSULIN SYRINGE .3CC/29GX1/2") 29G X 1/2" 0.3 ML MISC, Inject 1 Syringe 3 (three) times daily as directed. Check blood sugar three times daily. Dx: E11.9, Disp: 100 each, Rfl: 3 .  lisinopril (PRINIVIL,ZESTRIL) 10 MG tablet, Take 1 tablet (10 mg total) by mouth at bedtime., Disp: 90 tablet, Rfl: 3 .  nitroGLYCERIN (NITROSTAT) 0.4 MG SL tablet, Place 1 tablet (0.4 mg total) under the tongue every 5 (five) minutes as needed for chest pain. (Patient not taking: Reported on 12/27/2018), Disp: 30 tablet, Rfl: 1 .  spironolactone (ALDACTONE) 25 MG tablet, Take 0.5 tablets (12.5 mg total) by mouth daily., Disp: 45 tablet, Rfl: 3 No Known Allergies    Social History   Socioeconomic History  . Marital status: Single    Spouse name: Not on file  . Number of children: 0  . Years of education: Not on file  . Highest education level: Not on file  Occupational History  . Occupation: Disability  Social Needs  . Financial resource strain: Not on file  . Food insecurity    Worry: Not on file    Inability: Not on file  . Transportation needs    Medical: Not on file    Non-medical: Not on file  Tobacco Use  .  Smoking status: Never Smoker  . Smokeless tobacco: Never Used  Substance and Sexual Activity  . Alcohol use: No  . Drug use: No  . Sexual activity: Yes    Birth control/protection: None  Lifestyle  . Physical activity    Days per week: Not on file    Minutes per session: Not on file  . Stress: Not on file  Relationships  . Social Herbalist on phone: Not on file    Gets together: Not on file    Attends religious service: Not on file    Active member of club or organization: Not on file    Attends  meetings of clubs or organizations: Not on file    Relationship status: Not on file  . Intimate partner violence    Fear of current or ex partner: Not on file    Emotionally abused: Not on file    Physically abused: Not on file    Forced sexual activity: Not on file  Other Topics Concern  . Not on file  Social History Narrative   Social History      Diet?       Do you drink/eat things with caffeine? yes      Marital status?       single                             What year were you married?      Do you live in a house, apartment, assisted living, condo, trailer, etc.? yes      Is it one or more stories? One story      How many persons live in your home?      Do you have any pets in your home? (please list) none      Highest level of education completed? graduate      Current or past profession:      Do you exercise?            no                          Type & how often?      Advanced Directives      Do you have a living will? no      Do you have a DNR form?                                  If not, do you want to discuss one? no      Do you have signed POA/HPOA for forms? no      Functional Status      Do you have difficulty bathing or dressing yourself? no      Do you have difficulty preparing food or eating? no      Do you have difficulty managing your medications? no      Do you have difficulty managing your finances? no      Do you have difficulty affording your medications? no    Physical Exam      Future Appointments  Date Time Provider [place]  01/18/2019  3:00 PM Marzetta Board, DPM [facility]  02/04/2019 11:00 AM [name], NP [facility] None    BP (!) 144/78   Pulse 88   Temp (!) 97.2 F (36.2 C)  Resp 16   Wt 162 lb (73.5 kg)   SpO2 99%   BMI 27.81 kg/m   Weight yesterday-162?  Last visit weight-166lungs clear,  CBG PTA-445  Pt reports he is doing ok, he denies c/p, he did get light headed when he  jumped up to answer the door and came down the stairs. His CBG is high at 445. He has not been able to get in touch with his endo doc to adjust the insulin dose. No edema noted,  he does report h/a are coming back. He has been drinking sodas and cranberry juice. I advised him that those contain a lot of sugar and he needs to avoid those. No missed doses of his meds. meds verified and pill box refilled.  Advised him to continue to reach out to endo doc to get his insulin adjusted.  Lungs clear, no sob.   Marylouise Stacks, [facility] Paramedic  01/16/19

## 2019-01-17 ENCOUNTER — Other Ambulatory Visit: Payer: Self-pay | Admitting: Pharmacist

## 2019-01-18 ENCOUNTER — Encounter: Payer: Self-pay | Admitting: Podiatry

## 2019-01-18 ENCOUNTER — Other Ambulatory Visit: Payer: Self-pay

## 2019-01-18 ENCOUNTER — Ambulatory Visit: Payer: Medicare HMO | Admitting: Podiatry

## 2019-01-18 ENCOUNTER — Telehealth: Payer: Self-pay | Admitting: Pharmacist

## 2019-01-18 DIAGNOSIS — L84 Corns and callosities: Secondary | ICD-10-CM | POA: Diagnosis not present

## 2019-01-18 DIAGNOSIS — E1142 Type 2 diabetes mellitus with diabetic polyneuropathy: Secondary | ICD-10-CM | POA: Diagnosis not present

## 2019-01-18 DIAGNOSIS — B351 Tinea unguium: Secondary | ICD-10-CM

## 2019-01-18 DIAGNOSIS — M79676 Pain in unspecified toe(s): Secondary | ICD-10-CM | POA: Diagnosis not present

## 2019-01-18 MED ORDER — REPATHA SURECLICK 140 MG/ML ~~LOC~~ SOAJ: 1.0000 "pen " | SUBCUTANEOUS | 3 refills | Status: DC

## 2019-01-18 NOTE — Patient Instructions (Signed)
Diabetes Mellitus and Foot Care Foot care is an important part of your health, especially when you have diabetes. Diabetes may cause you to have problems because of poor blood flow (circulation) to your feet and legs, which can cause your skin to:  Become thinner and drier.  Break more easily.  Heal more slowly.  Peel and crack. You may also have nerve damage (neuropathy) in your legs and feet, causing decreased feeling in them. This means that you may not notice minor injuries to your feet that could lead to more serious problems. Noticing and addressing any potential problems early is the best way to prevent future foot problems. How to care for your feet Foot hygiene  Wash your feet daily with warm water and mild soap. Do not use hot water. Then, pat your feet and the areas between your toes until they are completely dry. Do not soak your feet as this can dry your skin.  Trim your toenails straight across. Do not dig under them or around the cuticle. File the edges of your nails with an emery board or nail file.  Apply a moisturizing lotion or petroleum jelly to the skin on your feet and to dry, brittle toenails. Use lotion that does not contain alcohol and is unscented. Do not apply lotion between your toes. Shoes and socks  Wear clean socks or stockings every day. Make sure they are not too tight. Do not wear knee-high stockings since they may decrease blood flow to your legs.  Wear shoes that fit properly and have enough cushioning. Always look in your shoes before you put them on to be sure there are no objects inside.  To break in new shoes, wear them for just a few hours a day. This prevents injuries on your feet. Wounds, scrapes, corns, and calluses  Check your feet daily for blisters, cuts, bruises, sores, and redness. If you cannot see the bottom of your feet, use a mirror or ask someone for help.  Do not cut corns or calluses or try to remove them with medicine.  If you  find a minor scrape, cut, or break in the skin on your feet, keep it and the skin around it clean and dry. You may clean these areas with mild soap and water. Do not clean the area with peroxide, alcohol, or iodine.  If you have a wound, scrape, corn, or callus on your foot, look at it several times a day to make sure it is healing and not infected. Check for: ? Redness, swelling, or pain. ? Fluid or blood. ? Warmth. ? Pus or a bad smell. General instructions  Do not cross your legs. This may decrease blood flow to your feet.  Do not use heating pads or hot water bottles on your feet. They may burn your skin. If you have lost feeling in your feet or legs, you may not know this is happening until it is too late.  Protect your feet from hot and cold by wearing shoes, such as at the beach or on hot pavement.  Schedule a complete foot exam at least once a year (annually) or more often if you have foot problems. If you have foot problems, report any cuts, sores, or bruises to your health care provider immediately. Contact a health care provider if:  You have a medical condition that increases your risk of infection and you have any cuts, sores, or bruises on your feet.  You have an injury that is not   healing.  You have redness on your legs or feet.  You feel burning or tingling in your legs or feet.  You have pain or cramps in your legs and feet.  Your legs or feet are numb.  Your feet always feel cold.  You have pain around a toenail. Get help right away if:  You have a wound, scrape, corn, or callus on your foot and: ? You have pain, swelling, or redness that gets worse. ? You have fluid or blood coming from the wound, scrape, corn, or callus. ? Your wound, scrape, corn, or callus feels warm to the touch. ? You have pus or a bad smell coming from the wound, scrape, corn, or callus. ? You have a fever. ? You have a red line going up your leg. Summary  Check your feet every day  for cuts, sores, red spots, swelling, and blisters.  Moisturize feet and legs daily.  Wear shoes that fit properly and have enough cushioning.  If you have foot problems, report any cuts, sores, or bruises to your health care provider immediately.  Schedule a complete foot exam at least once a year (annually) or more often if you have foot problems. This information is not intended to replace advice given to you by your health care provider. Make sure you discuss any questions you have with your health care provider. Document Released: 06/17/2000 Document Revised: 08/02/2017 Document Reviewed: 07/22/2016 Elsevier Patient Education  2020 [employer]   Onychomycosis/Fungal Toenails  WHAT IS IT? An infection that lies within the keratin of your nail plate that is caused by a fungus.  WHY ME? Fungal infections affect all ages, sexes, races, and creeds.  There may be many factors that predispose you to a fungal infection such as age, coexisting medical conditions such as diabetes, or an autoimmune disease; stress, medications, fatigue, genetics, etc.  Bottom line: fungus thrives in a warm, moist environment and your shoes offer such a location.  IS IT CONTAGIOUS? Theoretically, yes.  You do not want to share shoes, nail clippers or files with someone who has fungal toenails.  Walking around barefoot in the same room or sleeping in the same bed is unlikely to transfer the organism.  It is important to realize, however, that fungus can spread easily from one nail to the next on the same foot.  HOW DO WE TREAT THIS?  There are several ways to treat this condition.  Treatment may depend on many factors such as age, medications, pregnancy, liver and kidney conditions, etc.  It is best to ask your doctor which options are available to you.  1. No treatment.   Unlike many other medical concerns, you can live with this condition.  However for many people this can be a painful condition and may lead to  ingrown toenails or a bacterial infection.  It is recommended that you keep the nails cut short to help reduce the amount of fungal nail. 2. Topical treatment.  These range from herbal remedies to prescription strength nail lacquers.  About 40-50% effective, topicals require twice daily application for approximately 9 to 12 months or until an entirely new nail has grown out.  The most effective topicals are medical grade medications available through physicians offices. 3. Oral antifungal medications.  With an 80-90% cure rate, the most common oral medication requires 3 to 4 months of therapy and stays in your system for a year as the new nail grows out.  Oral antifungal medications do require   blood work to make sure it is a safe drug for you.  A liver function panel will be performed prior to starting the medication and after the first month of treatment.  It is important to have the blood work performed to avoid any harmful side effects.  In general, this medication safe but blood work is required. 4. Laser Therapy.  This treatment is performed by applying a specialized laser to the affected nail plate.  This therapy is noninvasive, fast, and non-painful.  It is not covered by insurance and is therefore, out of pocket.  The results have been very good with a 80-95% cure rate.  [facility] is the only practice in the area to offer this therapy. 5. Permanent Nail Avulsion.  Removing the entire nail so that a new nail will not grow back. 

## 2019-01-18 NOTE — Telephone Encounter (Addendum)
Pt previously approved for Repatha however copay was cost prohibitive. Reached back out to pt since we have been having success with copay assistance through the [facility]. Pt was approved for $2500 copay assistance through 12/18/19. Mail order pharmacy will not accept grant info, rx sent to local pharmacy. Pt is aware and will call with concerns.

## 2019-01-23 ENCOUNTER — Encounter (HOSPITAL_COMMUNITY): Payer: Self-pay

## 2019-01-23 ENCOUNTER — Emergency Department (HOSPITAL_COMMUNITY): Payer: Medicare HMO

## 2019-01-23 ENCOUNTER — Other Ambulatory Visit: Payer: Self-pay

## 2019-01-23 ENCOUNTER — Other Ambulatory Visit (HOSPITAL_COMMUNITY): Payer: Self-pay

## 2019-01-23 ENCOUNTER — Observation Stay (HOSPITAL_COMMUNITY)
Admission: EM | Admit: 2019-01-23 | Discharge: 2019-01-25 | Disposition: A | Discharge status: Home / Self Care | Payer: Medicare HMO | Attending: Family Medicine | Admitting: Family Medicine

## 2019-01-23 DIAGNOSIS — I480 Paroxysmal atrial fibrillation: Secondary | ICD-10-CM | POA: Diagnosis not present

## 2019-01-23 DIAGNOSIS — E119 Type 2 diabetes mellitus without complications: Secondary | ICD-10-CM | POA: Insufficient documentation

## 2019-01-23 DIAGNOSIS — R1011 Right upper quadrant pain: Secondary | ICD-10-CM | POA: Diagnosis not present

## 2019-01-23 DIAGNOSIS — I10 Essential (primary) hypertension: Secondary | ICD-10-CM | POA: Diagnosis present

## 2019-01-23 DIAGNOSIS — Z794 Long term (current) use of insulin: Secondary | ICD-10-CM | POA: Diagnosis not present

## 2019-01-23 DIAGNOSIS — R0789 Other chest pain: Secondary | ICD-10-CM | POA: Diagnosis not present

## 2019-01-23 DIAGNOSIS — R109 Unspecified abdominal pain: Secondary | ICD-10-CM

## 2019-01-23 DIAGNOSIS — K219 Gastro-esophageal reflux disease without esophagitis: Secondary | ICD-10-CM | POA: Diagnosis not present

## 2019-01-23 DIAGNOSIS — Z79899 Other long term (current) drug therapy: Secondary | ICD-10-CM | POA: Diagnosis not present

## 2019-01-23 DIAGNOSIS — E785 Hyperlipidemia, unspecified: Secondary | ICD-10-CM | POA: Diagnosis not present

## 2019-01-23 DIAGNOSIS — R079 Chest pain, unspecified: Secondary | ICD-10-CM | POA: Diagnosis not present

## 2019-01-23 DIAGNOSIS — I428 Other cardiomyopathies: Secondary | ICD-10-CM

## 2019-01-23 DIAGNOSIS — R072 Precordial pain: Secondary | ICD-10-CM

## 2019-01-23 DIAGNOSIS — R7989 Other specified abnormal findings of blood chemistry: Secondary | ICD-10-CM

## 2019-01-23 DIAGNOSIS — R1084 Generalized abdominal pain: Secondary | ICD-10-CM

## 2019-01-23 DIAGNOSIS — Z20828 Contact with and (suspected) exposure to other viral communicable diseases: Secondary | ICD-10-CM | POA: Insufficient documentation

## 2019-01-23 DIAGNOSIS — I110 Hypertensive heart disease with heart failure: Secondary | ICD-10-CM | POA: Insufficient documentation

## 2019-01-23 DIAGNOSIS — E1159 Type 2 diabetes mellitus with other circulatory complications: Secondary | ICD-10-CM

## 2019-01-23 DIAGNOSIS — I5022 Chronic systolic (congestive) heart failure: Secondary | ICD-10-CM | POA: Diagnosis present

## 2019-01-23 DIAGNOSIS — Z7901 Long term (current) use of anticoagulants: Secondary | ICD-10-CM | POA: Diagnosis not present

## 2019-01-23 DIAGNOSIS — E1140 Type 2 diabetes mellitus with diabetic neuropathy, unspecified: Secondary | ICD-10-CM | POA: Diagnosis not present

## 2019-01-23 DIAGNOSIS — R1013 Epigastric pain: Secondary | ICD-10-CM | POA: Diagnosis not present

## 2019-01-23 LAB — GLUCOSE, CAPILLARY
Glucose-Capillary: 261 mg/dL — ABNORMAL HIGH (ref 70–99)
Glucose-Capillary: 109 mg/dL — ABNORMAL HIGH (ref 70–99)
Glucose-Capillary: 113 mg/dL — ABNORMAL HIGH (ref 70–99)

## 2019-01-23 LAB — BASIC METABOLIC PANEL
Anion gap: 13 (ref 5–15)
BUN: 14 mg/dL (ref 6–20)
CO2: 19 mmol/L — ABNORMAL LOW (ref 22–32)
Calcium: 8.7 mg/dL — ABNORMAL LOW (ref 8.9–10.3)
Chloride: 103 mmol/L (ref 98–111)
Creatinine, Ser: 1.45 mg/dL — ABNORMAL HIGH (ref 0.61–1.24)
GFR calc Af Amer: >60 mL/min (ref >60)
GFR calc non Af Amer: 53 mL/min — ABNORMAL LOW (ref >60)
Glucose, Bld: 424 mg/dL — ABNORMAL HIGH (ref 70–99)
Potassium: 3.7 mmol/L (ref 3.5–5.1)
Sodium: 135 mmol/L (ref 135–145)

## 2019-01-23 LAB — HEPATIC FUNCTION PANEL
ALT: 20 U/L (ref 0–44)
AST: 21 U/L (ref 15–41)
Albumin: 3.5 g/dL (ref 3.5–5.0)
Alkaline Phosphatase: 63 U/L (ref 38–126)
Bilirubin, Direct: 0.1 mg/dL (ref 0.0–0.2)
Indirect Bilirubin: 0.4 mg/dL (ref 0.3–0.9)
Total Bilirubin: 0.5 mg/dL (ref 0.3–1.2)
Total Protein: 6.4 g/dL — ABNORMAL LOW (ref 6.5–8.1)

## 2019-01-23 LAB — URINALYSIS, ROUTINE W REFLEX MICROSCOPIC
Bacteria, UA: NONE SEEN
Bilirubin Urine: NEGATIVE
Glucose, UA: >=500 mg/dL — AB
Hgb urine dipstick: NEGATIVE
Ketones, ur: NEGATIVE mg/dL
Leukocytes,Ua: NEGATIVE
Nitrite: NEGATIVE
Protein, ur: NEGATIVE mg/dL
Specific Gravity, Urine: 1.043 — ABNORMAL HIGH (ref 1.005–1.030)
pH: 5 (ref 5.0–8.0)

## 2019-01-23 LAB — TROPONIN I (HIGH SENSITIVITY)
Troponin I (High Sensitivity): 28 ng/L — ABNORMAL HIGH (ref <18)
Troponin I (High Sensitivity): 61 ng/L — ABNORMAL HIGH (ref <18)
Troponin I (High Sensitivity): 46 ng/L — ABNORMAL HIGH (ref <18)

## 2019-01-23 LAB — CBC
HCT: 46.8 % (ref 39.0–52.0)
Hemoglobin: 16 g/dL (ref 13.0–17.0)
MCH: 31.1 pg (ref 26.0–34.0)
MCHC: 34.2 g/dL (ref 30.0–36.0)
MCV: 90.9 fL (ref 80.0–100.0)
Platelets: 255 10*3/uL (ref 150–400)
RBC: 5.15 MIL/uL (ref 4.22–5.81)
RDW: 11.9 % (ref 11.5–15.5)
WBC: 11.5 10*3/uL — ABNORMAL HIGH (ref 4.0–10.5)
nRBC: 0 % (ref 0.0–0.2)

## 2019-01-23 LAB — SARS CORONAVIRUS 2 BY RT PCR (HOSPITAL ORDER, PERFORMED IN ~~LOC~~ HOSPITAL LAB): SARS Coronavirus 2: NEGATIVE

## 2019-01-23 LAB — LIPASE, BLOOD: Lipase: 31 U/L (ref 11–51)

## 2019-01-23 LAB — CBG MONITORING, ED
Glucose-Capillary: 293 mg/dL — ABNORMAL HIGH (ref 70–99)
Glucose-Capillary: 310 mg/dL — ABNORMAL HIGH (ref 70–99)

## 2019-01-23 MED ORDER — ACETAMINOPHEN 325 MG PO TABS
650.0000 mg | ORAL_TABLET | ORAL | Status: DC | PRN
  Administered 2019-01-24: 650 mg via ORAL
  Filled 2019-01-23: qty 2

## 2019-01-23 MED ORDER — FENTANYL CITRATE (PF) 100 MCG/2ML IJ SOLN
100.0000 ug | Freq: Once | INTRAMUSCULAR | Status: AC
  Administered 2019-01-23: 100 ug via INTRAVENOUS
  Filled 2019-01-23: qty 2

## 2019-01-23 MED ORDER — LISINOPRIL 10 MG PO TABS
10.0000 mg | ORAL_TABLET | Freq: Every day | ORAL | Status: DC
  Administered 2019-01-24 (×2): 10 mg via ORAL
  Filled 2019-01-23 (×2): qty 1

## 2019-01-23 MED ORDER — CARVEDILOL 6.25 MG PO TABS
6.2500 mg | ORAL_TABLET | Freq: Two times a day (BID) | ORAL | Status: DC
  Administered 2019-01-24 – 2019-01-25 (×4): 6.25 mg via ORAL
  Filled 2019-01-23 (×4): qty 1

## 2019-01-23 MED ORDER — IOHEXOL 300 MG/ML  SOLN
100.0000 mL | Freq: Once | INTRAMUSCULAR | Status: AC | PRN
  Administered 2019-01-23: 100 mL via INTRAVENOUS

## 2019-01-23 MED ORDER — APIXABAN 5 MG PO TABS
5.0000 mg | ORAL_TABLET | Freq: Two times a day (BID) | ORAL | Status: DC
  Administered 2019-01-24 – 2019-01-25 (×4): 5 mg via ORAL
  Filled 2019-01-23 (×4): qty 1

## 2019-01-23 MED ORDER — AMITRIPTYLINE HCL 10 MG PO TABS
20.0000 mg | ORAL_TABLET | Freq: Every day | ORAL | Status: DC
  Administered 2019-01-24 (×2): 20 mg via ORAL
  Filled 2019-01-23 (×2): qty 2

## 2019-01-23 MED ORDER — NITROGLYCERIN 0.4 MG SL SUBL: 0.4000 mg | SUBLINGUAL_TABLET | SUBLINGUAL | Status: DC | PRN

## 2019-01-23 MED ORDER — ASPIRIN 81 MG PO CHEW
324.0000 mg | CHEWABLE_TABLET | Freq: Once | ORAL | Status: AC
  Administered 2019-01-23: 324 mg via ORAL
  Filled 2019-01-23: qty 4

## 2019-01-23 MED ORDER — ONDANSETRON HCL 4 MG/2ML IJ SOLN
4.0000 mg | Freq: Four times a day (QID) | INTRAMUSCULAR | Status: DC | PRN
  Administered 2019-01-24: 4 mg via INTRAVENOUS
  Filled 2019-01-23: qty 2

## 2019-01-23 MED ORDER — SODIUM CHLORIDE 0.9 % IV SOLN
INTRAVENOUS | Status: AC
  Administered 2019-01-23: 19:00:00 via INTRAVENOUS

## 2019-01-23 MED ORDER — INSULIN ASPART 100 UNIT/ML ~~LOC~~ SOLN
0.0000 [IU] | Freq: Three times a day (TID) | SUBCUTANEOUS | Status: DC
  Administered 2019-01-23: 7 [IU] via SUBCUTANEOUS
  Administered 2019-01-24 (×2): 2 [IU] via SUBCUTANEOUS
  Administered 2019-01-24 – 2019-01-25 (×2): 3 [IU] via SUBCUTANEOUS

## 2019-01-23 MED ORDER — SODIUM CHLORIDE 0.9% FLUSH: 3.0000 mL | Freq: Once | INTRAVENOUS | Status: DC

## 2019-01-23 MED ORDER — SPIRONOLACTONE 12.5 MG HALF TABLET
12.5000 mg | ORAL_TABLET | Freq: Every day | ORAL | Status: DC
  Administered 2019-01-24 – 2019-01-25 (×3): 12.5 mg via ORAL
  Filled 2019-01-23 (×4): qty 1

## 2019-01-23 MED ORDER — ATORVASTATIN CALCIUM 80 MG PO TABS
80.0000 mg | ORAL_TABLET | Freq: Every day | ORAL | Status: DC
  Administered 2019-01-24 – 2019-01-25 (×3): 80 mg via ORAL
  Filled 2019-01-23 (×2): qty 1

## 2019-01-23 MED ORDER — SODIUM CHLORIDE 0.9 % IV BOLUS
500.0000 mL | Freq: Once | INTRAVENOUS | Status: AC
  Administered 2019-01-23: 500 mL via INTRAVENOUS

## 2019-01-23 MED ORDER — INSULIN ASPART 100 UNIT/ML ~~LOC~~ SOLN: 0.0000 [IU] | Freq: Every day | SUBCUTANEOUS | Status: DC

## 2019-01-23 MED ORDER — DONEPEZIL HCL 10 MG PO TABS
10.0000 mg | ORAL_TABLET | Freq: Every day | ORAL | Status: DC
  Administered 2019-01-24 (×2): 10 mg via ORAL
  Filled 2019-01-23 (×3): qty 1

## 2019-01-23 MED ORDER — INSULIN GLARGINE 100 UNIT/ML ~~LOC~~ SOLN
70.0000 [IU] | Freq: Two times a day (BID) | SUBCUTANEOUS | Status: DC
  Administered 2019-01-24 – 2019-01-25 (×4): 70 [IU] via SUBCUTANEOUS
  Filled 2019-01-23 (×5): qty 0.7

## 2019-01-23 NOTE — ED Notes (Addendum)
Pt given turkey sandwich and diet ginger ale.   

## 2019-01-23 NOTE — ED Notes (Signed)
Cardiology at bedside.

## 2019-01-23 NOTE — H&P (Signed)
TRH H&P   Patient Demographics:    Calvin [name], is a 58 y.o. male  MRN: [id number]   DOB - August 21, 1960  Admit Date - 01/23/2019  Outpatient Primary MD for the patient is [name], Carlos American, NP  Referring MD/NP/PA: PA Geiple  Patient coming from: home  Chief Complaint  Patient presents with  . Chest Pain      HPI:    Calvin [name]  is a 58 y.o. male, with a hx of NICM, S-CHF w/ EF 35-40% 12/2017 Echo, uncontrolled DM, HTN, GERD, reported MI 2015 in [place], Afib 03/2018 on Coreg and Eliquis, intol Entresto  presenting today with complaints of chest pain , midsternal, across his chest, and in the radiation, not provoked by exertion, no relieving factors, intermittent, chronic, but has much worsened over last few hours, which prompted him to come to ED, denies any dyspnea, diarrhea, constipation, but he does report some nausea and vomiting, no syncope, presyncope, or cough, no fever or chills. -in ED and at 1.4, which is his baseline, troponin trended up from 28-61, EKG showing normal sinus rhythm, with some flattening of lateral ST waves, patient was seen by cardiologist, who recommended admission given his troponins trending up, but felt chest pain most likely musculoskeletal in origin, especially with cardiac cath last year significant for nonobstructive CAD.     Review of systems:    In addition to the HPI above,  No Fever-chills, No Headache, No changes with Vision or hearing, No problems swallowing food or Liquids, Complaints of chest pain, no  Cough or Shortness of Breath, No Abdominal pain, No Nausea or Vommitting, Bowel movements are regular, No Blood in stool or Urine, No dysuria, No new skin rashes or bruises, No new joints pains-aches,  No new weakness, tingling, numbness in any extremity, No recent weight gain or loss, No polyuria, polydypsia or polyphagia,  No significant Mental Stressors.  A full 10 point Review of Systems was done, except as stated above, all other Review of Systems were negative.   With Past History of the following :    Past Medical History:  Diagnosis Date  . Adenoidal hypertrophy   . Adenomatous colon polyps 2012  . Arthritis   . Diabetes mellitus   . GERD (gastroesophageal reflux disease)   . Heart attack ([place])    While living in [place]  [name] Hx of adenomatous colonic polyps 08/18/2017  . Hyperlipidemia   . Hypertension   . Mild CAD    a. cath in 08/2017 showing mild nonobstructive CAD with scattered 20-30% stenosis.   . Nonischemic cardiomyopathy ([place])    a. EF 20-25% by echo in 09/2017 with cath showing mild CAD. b.  Last echo 12/2017 EF 35-40%, grade 2 DD.  [name] Obesity   . PAF (paroxysmal atrial fibrillation) ([place])   . Sleep apnea    cpap, pt says no longer has  Past Surgical History:  Procedure Laterality Date  . COLONOSCOPY  05/13/11   9 adenomas  . FOREARM SURGERY    . MUSCLE BIOPSY    . RIGHT/LEFT HEART CATH AND CORONARY ANGIOGRAPHY N/A 08/25/2017   Procedure: RIGHT/LEFT HEART CATH AND CORONARY ANGIOGRAPHY;  Surgeon: [name], MD;  Location: [facility];  Service: Cardiovascular;  Laterality: N/A;      Social History:     Social History   Tobacco Use  . Smoking status: Never Smoker  . Smokeless tobacco: Never Used  Substance Use Topics  . Alcohol use: No     Lives - at home  Mobility - independent   Family History :     Family History  Problem Relation Age of Onset  . Heart disease Mother   . Heart attack Mother 82  . Hypertension Mother   . Hyperlipidemia Mother   . Diabetes Mother   . Heart disease Father   . Heart attack Father 49  . Hypertension Father   . Hyperlipidemia Father   . Diabetes Father   . Heart attack Sister 27  . Colon cancer Neg Hx   . Stomach cancer Neg Hx   . Esophageal cancer Neg Hx   . Rectal cancer Neg Hx      Home  Medications:   Prior to Admission medications   Medication Sig Start Date End Date Taking? Authorizing Provider  amitriptyline (ELAVIL) 10 MG tablet Take 1 tablet at bed time for one week and then increase 2 tablets at bed time Patient taking differently: Take 20 mg by mouth at bedtime.  11/28/18  Yes Cameron Sprang, MD  apixaban (ELIQUIS) 5 MG TABS tablet Take 5 mg by mouth 2 (two) times daily.   Yes [provider]  atorvastatin (LIPITOR) 80 MG tablet Take 1 tablet (80 mg total) by mouth daily. 11/28/18 02/26/19 Yes Larey Dresser, MD  carvedilol (COREG) 6.25 MG tablet Take 1 tablet (6.25 mg total) by mouth 2 (two) times daily with a meal. 09/27/18  Yes Larey Dresser, MD  donepezil (ARICEPT) 10 MG tablet Take 1 tablet (10 mg total) by mouth at bedtime. 09/18/18  Yes Cameron Sprang, MD  Insulin Glargine, 2 Unit Dial, (TOUJEO MAX SOLOSTAR) 300 UNIT/ML SOPN Inject 200 Units into the skin every morning. Dx Code: E11.9 12/13/18  Yes [name], MD  lisinopril (PRINIVIL,ZESTRIL) 10 MG tablet Take 1 tablet (10 mg total) by mouth at bedtime. 09/27/18  Yes Larey Dresser, MD  nitroGLYCERIN (NITROSTAT) 0.4 MG SL tablet Place 1 tablet (0.4 mg total) under the tongue every 5 (five) minutes as needed for chest pain. 10/27/17  Yes [name], DO  spironolactone (ALDACTONE) 25 MG tablet Take 0.5 tablets (12.5 mg total) by mouth daily. 11/28/18 02/26/19 Yes Larey Dresser, MD  Evolocumab (REPATHA SURECLICK) 263 MG/ML SOAJ Inject 1 pen into the skin every 14 (fourteen) days. Patient not taking: Reported on 01/23/2019 01/18/19   Larey Dresser, MD  Insulin Syringe-Needle U-100 (INSULIN SYRINGE .3CC/29GX1/2") 29G X 1/2" 0.3 ML MISC Inject 1 Syringe 3 (three) times daily as directed. Check blood sugar three times daily. Dx: E11.9 05/10/17   [name], DO     Allergies:    No Known Allergies   Physical Exam:   Vitals  Blood pressure 130/81, pulse 89, temperature 97.9 F (36.6 C),  temperature source Oral, resp. rate (!) 21, SpO2 100 %.   1. General well developed  lying in  bed in NAD,   2. Normal affect and insight, Not Suicidal or Homicidal, Awake Alert, Oriented X 3.  3. No F.N deficits, ALL C.Nerves Intact, Strength 5/5 all 4 extremities, Sensation intact all 4 extremities, Plantars down going.  4. Ears and Eyes appear Normal, Conjunctivae clear, PERRLA. Moist Oral Mucosa.  5. Supple Neck, No JVD, No cervical lymphadenopathy appriciated, No Carotid Bruits.  6. Symmetrical Chest wall movement, Good air movement bilaterally, CTAB.  7. RRR, No Gallops, Rubs or Murmurs, No Parasternal Heave. has reproducible chest pain on palpation  8. Positive Bowel Sounds, Abdomen Soft, No tenderness, No organomegaly appriciated,No rebound -guarding or rigidity.  9.  No Cyanosis, Normal Skin Turgor, No Skin Rash or Bruise.  10. Good muscle tone,  joints appear normal , no effusions, Normal ROM.  11. No Palpable Lymph Nodes in Neck or Axillae    Data Review:    CBC Recent Labs  Lab 01/23/19 1253  WBC 11.5*  HGB 16.0  HCT 46.8  PLT 255  MCV 90.9  MCH 31.1  MCHC 34.2  RDW 11.9   ------------------------------------------------------------------------------------------------------------------  Chemistries  Recent Labs  Lab 01/23/19 1253 01/23/19 1500  NA 135  --   K 3.7  --   CL 103  --   CO2 19*  --   GLUCOSE 424*  --   BUN 14  --   CREATININE 1.45*  --   CALCIUM 8.7*  --   AST  --  21  ALT  --  20  ALKPHOS  --  63  BILITOT  --  0.5   ------------------------------------------------------------------------------------------------------------------ estimated creatinine clearance is 50.7 mL/min (A) (by C-G formula based on SCr of 1.45 mg/dL (H)). ------------------------------------------------------------------------------------------------------------------ No results for input(s): TSH, T4TOTAL, T3FREE, THYROIDAB in the last 72 hours.  Invalid  input(s): FREET3  Coagulation profile No results for input(s): INR, PROTIME in the last 168 hours. ------------------------------------------------------------------------------------------------------------------- No results for input(s): DDIMER in the last 72 hours. -------------------------------------------------------------------------------------------------------------------  Cardiac Enzymes No results for input(s): CKMB, TROPONINI, MYOGLOBIN in the last 168 hours.  Invalid input(s): CK ------------------------------------------------------------------------------------------------------------------    Component Value Date/Time   BNP 132.4 (H) 03/09/2018 1849     ---------------------------------------------------------------------------------------------------------------  Urinalysis    Component Value Date/Time   COLORURINE YELLOW 03/10/2018 0436   APPEARANCEUR CLEAR 03/10/2018 0436   LABSPEC 1.023 03/10/2018 0436   PHURINE 6.0 03/10/2018 0436   GLUCOSEU >=500 (A) 03/10/2018 0436   HGBUR NEGATIVE 03/10/2018 0436   BILIRUBINUR NEGATIVE 03/10/2018 0436   KETONESUR 5 (A) 03/10/2018 0436   PROTEINUR NEGATIVE 03/10/2018 0436   UROBILINOGEN 0.2 11/28/2012 1857   NITRITE NEGATIVE 03/10/2018 0436   LEUKOCYTESUR NEGATIVE 03/10/2018 0436    ----------------------------------------------------------------------------------------------------------------   Imaging Results:    Dg Chest 2 View  Result Date: 01/23/2019 CLINICAL DATA:  Chest pain EXAM: CHEST - 2 VIEW COMPARISON:  July 16, 2018 FINDINGS: Stable cardiomegaly. The hila, mediastinum, lungs, and pleura are otherwise unremarkable. IMPRESSION: No active cardiopulmonary disease. Electronically Signed   By: Dorise Bullion III M.D   On: 01/23/2019 13:27   Ct Abdomen Pelvis W Contrast  Result Date: 01/23/2019 CLINICAL DATA:  Acute epigastric abdominal pain. EXAM: CT ABDOMEN AND PELVIS WITH CONTRAST TECHNIQUE:  Multidetector CT imaging of the abdomen and pelvis was performed using the standard protocol following bolus administration of intravenous contrast. CONTRAST:  165m OMNIPAQUE IOHEXOL 300 MG/ML  SOLN COMPARISON:  CT scan dated 12/20/2011 FINDINGS: Lower chest: No acute abnormality. Hepatobiliary: No focal liver abnormality is seen. No gallstones,  gallbladder wall thickening, or biliary dilatation. Pancreas: Unremarkable. No pancreatic ductal dilatation or surrounding inflammatory changes. Spleen: Normal in size without focal abnormality. Adrenals/Urinary Tract: Adrenal glands are unremarkable. Kidneys are normal, without renal calculi, focal lesion, or hydronephrosis. Bladder is unremarkable. Stomach/Bowel: Stomach is within normal limits. Appendix appears normal. No evidence of bowel wall thickening, distention, or inflammatory changes. Vascular/Lymphatic: Aortic atherosclerosis. No enlarged abdominal or pelvic lymph nodes. Reproductive: Prostate is unremarkable. Other: No abdominal wall hernia or abnormality. No abdominopelvic ascites. Musculoskeletal: No acute or significant osseous findings. IMPRESSION: Benign-appearing abdomen and pelvis. Aortic Atherosclerosis (ICD10-I70.0). Electronically Signed   By: Lorriane Shire M.D.   On: 01/23/2019 16:52    My personal review of EKG: Rhythm NSR, Rate  105 /min, QTc 489 , mild nonspecific ST changes   Assessment & Plan:    Principal Problem:   Chest pain Active Problems:   DM (diabetes mellitus) (HCC)   HLD (hyperlipidemia)   Chronic systolic heart failure (H[facility])   Essential hypertension   Gastroesophageal reflux disease   Non-cardiac chest pain   AF (paroxysmal atrial fibrillation) ([facility])   Abdominal pain   Chest pain  -Urology input greatly appreciated, appears to be nontypical chest pain, reproduced by palpation, not provoked by exertion, last cardiac cath last year with nonobstructive coronary artery disease, but given his troponin is trending up,  he will be admitted for observation per cardiology recommendation, will continue to trend troponins and will await further recommendation.  Chronic systolic CHF/nonischemic cardiomyopathy -Appears to be euvolemic, evidence of volume overload, continue with ACE inhibitor, Aldactone, Coreg, intolerant to Entresto in the past -Most recent echo in 2019 significant for EF 35%, with grade 2 diastolic dysfunction.  History of nonobstructive coronary disease -continue statin. No aspirin given need for anticoagulation.  Hypertension -Continue with home medication  Diabetes type 2 -Patient reports he is on insulin 94 units twice daily, compliant all the time, does not recall the brand of insulin, for now I will keep him on glargine 70 units twice daily, with insulin sliding scale.  Hyperlipidemia -Continue with home dose Lipitor .  CKD stage III -Kidney function at baseline  DVT Prophylaxis Eliquis SCDs  AM Labs Ordered, also please review Full Orders  Family Communication: Admission, patients condition and plan of care including tests being ordered have been discussed with the patient  who indicate understanding and agree with the plan and Code Status.  Code Status Full  Likely DC to Home  Condition GUARDED    Consults called: Cardiology  Admission status: Observation  Time spent in minutes : 55 minutes   Phillips Climes M.D on 01/23/2019 at 7:18 PM  Between 7am to 7pm - Pager - [phone]. After 7pm go to [email or web address] - password [facility]  [facility] - Office  [phone]

## 2019-01-23 NOTE — ED Provider Notes (Signed)
Patient care assumed from [facility], [name] at shift change.  See his note for full H&P  Per his note, "Patient with history of non-ischemic cardiomyopathy with history of combined systolic and diastolic heart failure, diabetes, hypertension, coronary catheterization showing mild diffuse disease in 09/2017, afib on Eliquis -- presents the emergency department with complaint of chest pain, abdominal pain, nausea.  Patient states that he was awake approximately 6 or 7 hours ago and developed the symptoms.  No syncope.  He states that he broke out in a rash on his left arm.  No diarrhea or fever.  No cough.  He states that he felt short of breath.  No treatment prior to arrival.  The onset of this condition was acute. The course is constant. Aggravating factors: none. Alleviating factors: none. "   Physical Exam  BP 122/68 (BP Location: Left Arm)   Pulse 89   Temp 97.7 F (36.5 C) (Oral)   Resp (!) 24   SpO2 100%   Physical Exam Constitutional:      General: He is not in acute distress.    Appearance: He is well-developed.  Eyes:     Conjunctiva/sclera: Conjunctivae normal.  Cardiovascular:     Rate and Rhythm: Normal rate and regular rhythm.  Pulmonary:     Effort: Pulmonary effort is normal.     Breath sounds: Normal breath sounds.  Skin:    General: Skin is warm and dry.  Neurological:     Mental Status: He is alert and oriented to person, place, and time.     Comments: Clear speech, no facial droop     ED Course/Procedures     Procedures  Results for orders placed or performed during the hospital encounter of 01/23/19  SARS Coronavirus 2 (CEPHEID - Performed in [facility] lab), [facility] Order   Specimen: Nasopharyngeal Swab  Result Value Ref Range   SARS Coronavirus 2 NEGATIVE NEGATIVE  Basic metabolic panel  Result Value Ref Range   Sodium 135 135 - 145 mmol/L   Potassium 3.7 3.5 - 5.1 mmol/L   Chloride 103 98 - 111 mmol/L   CO2 19 (L) 22 - 32 mmol/L   Glucose,  Bld 424 (H) 70 - 99 mg/dL   BUN 14 6 - 20 mg/dL   Creatinine, Ser 1.45 (H) 0.61 - 1.24 mg/dL   Calcium 8.7 (L) 8.9 - 10.3 mg/dL   GFR calc non Af Amer 53 (L) >60 mL/min   GFR calc Af Amer >60 >60 mL/min   Anion gap 13 5 - 15  CBC  Result Value Ref Range   WBC 11.5 (H) 4.0 - 10.5 K/uL   RBC 5.15 4.22 - 5.81 MIL/uL   Hemoglobin 16.0 13.0 - 17.0 g/dL   HCT 46.8 39.0 - 52.0 %   MCV 90.9 80.0 - 100.0 fL   MCH 31.1 26.0 - 34.0 pg   MCHC 34.2 30.0 - 36.0 g/dL   RDW 11.9 11.5 - 15.5 %   Platelets 255 150 - 400 K/uL   nRBC 0.0 0.0 - 0.2 %  Hepatic function panel  Result Value Ref Range   Total Protein 6.4 (L) 6.5 - 8.1 g/dL   Albumin 3.5 3.5 - 5.0 g/dL   AST 21 15 - 41 U/L   ALT 20 0 - 44 U/L   Alkaline Phosphatase 63 38 - 126 U/L   Total Bilirubin 0.5 0.3 - 1.2 mg/dL   Bilirubin, Direct 0.1 0.0 - 0.2 mg/dL   Indirect  Bilirubin 0.4 0.3 - 0.9 mg/dL  Lipase, blood  Result Value Ref Range   Lipase 31 11 - 51 U/L  Urinalysis, Routine w reflex microscopic  Result Value Ref Range   Color, Urine YELLOW YELLOW   APPearance CLEAR CLEAR   Specific Gravity, Urine 1.043 (H) 1.005 - 1.030   pH 5.0 5.0 - 8.0   Glucose, UA >=500 (A) NEGATIVE mg/dL   Hgb urine dipstick NEGATIVE NEGATIVE   Bilirubin Urine NEGATIVE NEGATIVE   Ketones, ur NEGATIVE NEGATIVE mg/dL   Protein, ur NEGATIVE NEGATIVE mg/dL   Nitrite NEGATIVE NEGATIVE   Leukocytes,Ua NEGATIVE NEGATIVE   RBC / HPF 0-5 0 - 5 RBC/hpf   WBC, UA 0-5 0 - 5 WBC/hpf   Bacteria, UA NONE SEEN NONE SEEN   Squamous Epithelial / LPF 0-5 0 - 5   Mucus PRESENT   Glucose, capillary  Result Value Ref Range   Glucose-Capillary 261 (H) 70 - 99 mg/dL  Glucose, capillary  Result Value Ref Range   Glucose-Capillary 109 (H) 70 - 99 mg/dL  Glucose, capillary  Result Value Ref Range   Glucose-Capillary 113 (H) 70 - 99 mg/dL  CBG monitoring, ED  Result Value Ref Range   Glucose-Capillary 293 (H) 70 - 99 mg/dL  CBG monitoring, ED  Result Value Ref  Range   Glucose-Capillary 310 (H) 70 - 99 mg/dL  Troponin I (High Sensitivity)  Result Value Ref Range   Troponin I (High Sensitivity) 28 (H) <18 ng/L  Troponin I (High Sensitivity)  Result Value Ref Range   Troponin I (High Sensitivity) 61 (H) <18 ng/L  Troponin I (High Sensitivity)  Result Value Ref Range   Troponin I (High Sensitivity) 46 (H) <18 ng/L  Troponin I (High Sensitivity)  Result Value Ref Range   Troponin I (High Sensitivity) 40 (H) <18 ng/L   Dg Chest 2 View  Result Date: 01/23/2019 CLINICAL DATA:  Chest pain EXAM: CHEST - 2 VIEW COMPARISON:  July 16, 2018 FINDINGS: Stable cardiomegaly. The hila, mediastinum, lungs, and pleura are otherwise unremarkable. IMPRESSION: No active cardiopulmonary disease. Electronically Signed   By: Dorise Bullion III M.D   On: 01/23/2019 13:27   Ct Abdomen Pelvis W Contrast  Result Date: 01/23/2019 CLINICAL DATA:  Acute epigastric abdominal pain. EXAM: CT ABDOMEN AND PELVIS WITH CONTRAST TECHNIQUE: Multidetector CT imaging of the abdomen and pelvis was performed using the standard protocol following bolus administration of intravenous contrast. CONTRAST:  179m OMNIPAQUE IOHEXOL 300 MG/ML  SOLN COMPARISON:  CT scan dated 12/20/2011 FINDINGS: Lower chest: No acute abnormality. Hepatobiliary: No focal liver abnormality is seen. No gallstones, gallbladder wall thickening, or biliary dilatation. Pancreas: Unremarkable. No pancreatic ductal dilatation or surrounding inflammatory changes. Spleen: Normal in size without focal abnormality. Adrenals/Urinary Tract: Adrenal glands are unremarkable. Kidneys are normal, without renal calculi, focal lesion, or hydronephrosis. Bladder is unremarkable. Stomach/Bowel: Stomach is within normal limits. Appendix appears normal. No evidence of bowel wall thickening, distention, or inflammatory changes. Vascular/Lymphatic: Aortic atherosclerosis. No enlarged abdominal or pelvic lymph nodes. Reproductive: Prostate is  unremarkable. Other: No abdominal wall hernia or abnormality. No abdominopelvic ascites. Musculoskeletal: No acute or significant osseous findings. IMPRESSION: Benign-appearing abdomen and pelvis. Aortic Atherosclerosis (ICD10-I70.0). Electronically Signed   By: JLorriane ShireM.D.   On: 01/23/2019 16:52      EKG Interpretation  Date/Time:  Wednesday January 23 2019 12:37:45 EDT Ventricular Rate:  105 PR Interval:  156 QRS Duration: 78 QT Interval:  370 QTC Calculation: 489 R Axis:  6 Text Interpretation:  Sinus tachycardia Nonspecific ST and T wave abnormality Abnormal ECG when compared to prior, faster rate.  No STEMI Confirmed by Antony Blackbird [phone]) on 01/23/2019 12:59:50 PM        MDM   Briefly, 58 year old male with a history of ischemic cardiomyopathy presenting to the emergency department today complaining of midsternal chest pain that radiates to the bilateral shoulders.  He notes that he had associated nausea and one episode of vomiting.  He is also felt lightheaded and dizzy.  He has associated shortness of breath for the last 1 to 2 days.  Denies any diaphoresis cough or fevers.  States he had a similar pain 4 years ago and was told that it was due to his heart.   Reviewed workup CBC with mild leukocytosis.  No anemia. BMP with low bicarb at 19.  Elevated blood glucose of 424.  Normal anion gap.  Elevated creatinine at 1.45 which does appear consistent with prior. Liver enzymes are nonacute Lipase is normal. UA with glucosuria but no ketonuria to suggest DKA. COVID testing is negative Initial troponin is elevated at 28.  Delta troponin is increasing at 61.  EKG Sinus tachycardia Nonspecific ST and T wave abnormality Abnormal ECG when compared to prior, faster rate.  No STEMI   CXR negative  CT abd/pelvis nonacute  Patient with significant cardiac history presenting with typical chest pain symptoms and has uptrending troponin in the ED.  Given aspirin.  Cardiology  consulted.  Will plan for admission.  5:57 PM Dr. Ellyn Hack and APP with cardiology is at bedside evaluating the patient.  They will consult on the patient.  They recommended admission for observation.  Discussed case with Dr. Waldron [facility] who accepts patient for admission for chest pain r/o   Rodney Booze, [name] 01/24/19 [place], [place], DO 01/26/19 [phone]

## 2019-01-23 NOTE — Progress Notes (Addendum)
Patients CBG 310 after eating [place] sandwich in ED. ED RN gave 7 units. S/p insulin CBG is 261.  Patient had a fast food meal at bed side and stating he was hungry and was going to eat it. Patient educated about the importance of keeping CBG at baseline and health eating. Patient stated understanding and will continue to eat food. Will monitor patient closely.

## 2019-01-23 NOTE — Consult Note (Addendum)
Cardiology Consultation:   Patient ID: Calvin [name]; [id number]; 15-May-1961   Admit date: 01/23/2019 Date of Consult: 01/23/2019  Primary Care Provider: [name], NP Primary Cardiologist: [name], MD  Advanced Heart Failure: Glori Bickers, MD Primary Electrophysiologist:  None   Patient Profile:   Calvin [name] is a 58 y.o. male with a hx of NICM, S-CHF w/ EF 35-40% 12/2017 Echo, uncontrolled DM, HTN, GERD, reported MI 2015 in [place], Afib 03/2018 on Coreg and Eliquis, intol Entresto who is being seen today for the evaluation of chest pain at the request of Dr Tyrone Nine.  History of Present Illness:   Calvin [name] generally feels poorly. He has multiple symptoms including:  Chest pain, no change w/ exertion. It is across his chest, there is chest wall tenderness. No change w/ deep inspiration or movement. It can last from days to hours. Episodic, has been going on for a long time.  C/O N&V today. No diarrhea or constipation. +abd pain. No blood in stools, no melena.  Pt has pain in legs and arms, no numbness.   All sx except the chest pain are sudden in onset, he was fine yesterday morning.   No palpitations, no presycope or syncope.  Does not weigh daily, but does not think he has gained any weight. Does not wake w/ LE edema, does not sleep well, but denies orthopnea or PND.  No cough, no fevers or chills.   Says had a repeat sleep study and no longer has OSA.   Has dizzy spells at times, has not passed out or fallen from this.   He walks a great deal. Sometimes R leg gives out, and he falls. Uses a cane, this helps.   Also has memory problems at times.  Says he is compliant with medications and has not missed any doses of Eliquis.   Past Medical History:  Diagnosis Date  . Adenoidal hypertrophy   . Adenomatous colon polyps 2012  . Arthritis   . Diabetes mellitus   . GERD (gastroesophageal reflux disease)   . Heart attack ([place])    While living in [place]  [name] Hx of adenomatous colonic polyps 08/18/2017  . Hyperlipidemia   . Hypertension   . Mild CAD    a. cath in 08/2017 showing mild nonobstructive CAD with scattered 20-30% stenosis.   . Nonischemic cardiomyopathy ([place])    a. EF 20-25% by echo in 09/2017 with cath showing mild CAD. b.  Last echo 12/2017 EF 35-40%, grade 2 DD.  [name] Obesity   . PAF (paroxysmal atrial fibrillation) ([place])   . Sleep apnea    cpap, pt says no longer has    Past Surgical History:  Procedure Laterality Date  . COLONOSCOPY  05/13/11   9 adenomas  . FOREARM SURGERY    . MUSCLE BIOPSY    . RIGHT/LEFT HEART CATH AND CORONARY ANGIOGRAPHY N/A 08/25/2017   Procedure: RIGHT/LEFT HEART CATH AND CORONARY ANGIOGRAPHY;  Surgeon: [name], MD;  Location: [facility];  Service: Cardiovascular;  Laterality: N/A;     Prior to Admission medications   Medication Sig Start Date End Date Taking? Authorizing Provider  amitriptyline (ELAVIL) 10 MG tablet Take 1 tablet at bed time for one week and then increase 2 tablets at bed time Patient taking differently: Take 20 mg by mouth at bedtime.  11/28/18  Yes Cameron Sprang, MD  apixaban (ELIQUIS) 5 MG TABS tablet Take 5 mg by mouth 2 (two) times daily.  Yes [provider]  atorvastatin (LIPITOR) 80 MG tablet Take 1 tablet (80 mg total) by mouth daily. 11/28/18 02/26/19 Yes Larey Dresser, MD  carvedilol (COREG) 6.25 MG tablet Take 1 tablet (6.25 mg total) by mouth 2 (two) times daily with a meal. 09/27/18  Yes Larey Dresser, MD  donepezil (ARICEPT) 10 MG tablet Take 1 tablet (10 mg total) by mouth at bedtime. 09/18/18  Yes Cameron Sprang, MD  Insulin Glargine, 2 Unit Dial, (TOUJEO MAX SOLOSTAR) 300 UNIT/ML SOPN Inject 200 Units into the skin every morning. Dx Code: E11.9 12/13/18  Yes [name], MD  lisinopril (PRINIVIL,ZESTRIL) 10 MG tablet Take 1 tablet (10 mg total) by mouth at bedtime. 09/27/18  Yes Larey Dresser, MD   nitroGLYCERIN (NITROSTAT) 0.4 MG SL tablet Place 1 tablet (0.4 mg total) under the tongue every 5 (five) minutes as needed for chest pain. 10/27/17  Yes [name], DO  spironolactone (ALDACTONE) 25 MG tablet Take 0.5 tablets (12.5 mg total) by mouth daily. 11/28/18 02/26/19 Yes Larey Dresser, MD  Evolocumab (REPATHA SURECLICK) 092 MG/ML SOAJ Inject 1 pen into the skin every 14 (fourteen) days. Patient not taking: Reported on 01/23/2019 01/18/19   Larey Dresser, MD  Insulin Syringe-Needle U-100 (INSULIN SYRINGE .3CC/29GX1/2") 29G X 1/2" 0.3 ML MISC Inject 1 Syringe 3 (three) times daily as directed. Check blood sugar three times daily. Dx: E11.9 05/10/17   [name], DO    Inpatient Medications: Scheduled Meds: . sodium chloride flush  3 mL Intravenous Once   Continuous Infusions:  PRN Meds:   Allergies:   No Known Allergies  Social History:   Social History   Socioeconomic History  . Marital status: Single    Spouse name: Not on file  . Number of children: 0  . Years of education: Not on file  . Highest education level: Not on file  Occupational History  . Occupation: Disability  Social Needs  . Financial resource strain: Not on file  . Food insecurity    Worry: Not on file    Inability: Not on file  . Transportation needs    Medical: Not on file    Non-medical: Not on file  Tobacco Use  . Smoking status: Never Smoker  . Smokeless tobacco: Never Used  Substance and Sexual Activity  . Alcohol use: No  . Drug use: No  . Sexual activity: Yes    Birth control/protection: None  Lifestyle  . Physical activity    Days per week: Not on file    Minutes per session: Not on file  . Stress: Not on file  Relationships  . Social Herbalist on phone: Not on file    Gets together: Not on file    Attends religious service: Not on file    Active member of club or organization: Not on file    Attends meetings of clubs or organizations: Not on file     Relationship status: Not on file  . Intimate partner violence    Fear of current or ex partner: Not on file    Emotionally abused: Not on file    Physically abused: Not on file    Forced sexual activity: Not on file  Other Topics Concern  . Not on file  Social History Narrative   Social History   Diet?    Do you drink/eat things with caffeine? yes   Marital status?       single  Do you live in a house, apartment, assisted living, condo, trailer, etc.? yes   Is it one or more stories? One story   How many persons live in your home?   Do you have any pets in your home? (please list)    Highest level of education completed? graduate   Do you exercise?            no                          Type & how often?   Advanced Directives   Do you have a living will? no   Do you have a DNR form?                                  If not, do you want to discuss one? no   Do you have signed POA/HPOA for forms? no      Functional Status   Do you have difficulty bathing or dressing yourself? no   Do you have difficulty preparing food or eating? no   Do you have difficulty managing your medications? no   Do you have difficulty managing your finances? no   Do you have difficulty affording your medications? no    Family History:   Family History  Problem Relation Age of Onset  . Heart disease Mother   . Heart attack Mother 19  . Hypertension Mother   . Hyperlipidemia Mother   . Diabetes Mother   . Heart disease Father   . Heart attack Father 57  . Hypertension Father   . Hyperlipidemia Father   . Diabetes Father   . Heart attack Sister 68  . Colon cancer Neg Hx   . Stomach cancer Neg Hx   . Esophageal cancer Neg Hx   . Rectal cancer Neg Hx    Family Status:  Family Status  Relation Name Status  . Mother  Deceased  . Father  Deceased  . Sister Darliss Ridgel  . Brother [employer]  . Sister Florian Buff  . Sister Diane Alive  . Sister [employer]  . MGM  Deceased  . MGF  Deceased   . PGM  Deceased  . PGF  Deceased  . Neg Hx  (Not Specified)    ROS:  Please see the history of present illness.  All other ROS reviewed and negative.     Physical Exam/Data:   Vitals:   01/23/19 1430 01/23/19 1542 01/23/19 1545 01/23/19 1805  BP: 130/88 130/76 138/72 130/81  Pulse:  87 88 89  Resp: 13 19 16  (!) 21  Temp:      TempSrc:      SpO2:  100% 100% 100%   No intake or output data in the 24 hours ending 01/23/19 1837 There were no vitals filed for this visit. There is no height or weight on file to calculate BMI.  General:  Well nourished, well developed, male, appears uncomfortable HEENT: normal Lymph: no adenopathy Neck: no JVD Endocrine:  No thryomegaly Vascular: No carotid bruits; Upper extremity pulses 2+, LE pulses slightly diminished Cardiac:  normal S1, S2; RRR; no murmur  Lungs:  clear to auscultation bilaterally, no wheezing, rhonchi or rales  Abd: soft, nontender, no hepatomegaly  Ext: no edema Musculoskeletal:  No deformities, BUE and BLE strength normal and equal, ++chest wall tenderness L lateral sternal border Skin: warm  and dry  Neuro:  CNs 2-12 intact, no focal abnormalities noted Psych:  Normal affect   EKG:  The EKG was personally reviewed and demonstrates:  SR, HR 91, diffuse ST changes are slightly better than previous, but old.  Telemetry:  Telemetry was personally reviewed and demonstrates:  SR   Relevant CV Studies:  ECHO: 12/18/2017 - Left ventricle: The cavity size was normal. Wall thickness was   normal. Systolic function was moderately reduced. The estimated   ejection fraction was in the range of 35% to 40%. Diffuse   hypokinesis. Features are consistent with a pseudonormal left   ventricular filling pattern, with concomitant abnormal relaxation   and increased filling pressure (grade 2 diastolic dysfunction).   Doppler parameters are consistent with high ventricular filling   pressure. - Mitral valve: Calcified annulus. Mildly  thickened leaflets .  Impressions:  - Moderate global reduction in LV systolic function; moderate   diastolic dysfunction.  CATH: 08/25/2017  Prox RCA to Mid RCA lesion is 30% stenosed.  Mid RCA to Dist RCA lesion is 30% stenosed.  Prox Cx to Dist Cx lesion is 20% stenosed.  Mid LAD lesion is 30% stenosed.  There is mild to moderate left ventricular systolic dysfunction.  LV end diastolic pressure is mildly elevated.  The left ventricular ejection fraction is 35-45% by visual estimate.  There is no mitral valve regurgitation.   1. Mild non-obstructive CAD 2. Moderate LV systolic dysfunction. Non-ischemic cardiomyopathy 3. Normal filling pressures.   Recommendations: Medical management of CAD and cardiomyopathy.   Laboratory Data:  Chemistry Recent Labs  Lab 01/23/19 1253  NA 135  K 3.7  CL 103  CO2 19*  GLUCOSE 424*  BUN 14  CREATININE 1.45*  CALCIUM 8.7*  GFRNONAA 53*  GFRAA >60  ANIONGAP 13    Lab Results  Component Value Date   ALT 20 01/23/2019   AST 21 01/23/2019   ALKPHOS 63 01/23/2019   BILITOT 0.5 01/23/2019   Hematology Recent Labs  Lab 01/23/19 1253  WBC 11.5*  RBC 5.15  HGB 16.0  HCT 46.8  MCV 90.9  MCH 31.1  MCHC 34.2  RDW 11.9  PLT 255   Cardiac Enzymes  High Sensitivity Troponin:   Recent Labs  Lab 01/23/19 1253 01/23/19 1448  TROPONINIHS 28* 61*     TSH:  Lab Results  Component Value Date   TSH 1.362 10/15/2017   Lipids: Lab Results  Component Value Date   CHOL 244 (H) 06/15/2018   HDL 53 06/15/2018   LDLCALC 177 (H) 06/15/2018   TRIG 72 06/15/2018   CHOLHDL 4.6 06/15/2018   HgbA1c: Lab Results  Component Value Date   HGBA1C 11.1 (A) 11/15/2018   Magnesium:  Magnesium  Date Value Ref Range Status  03/09/2018 1.9 1.7 - 2.4 mg/dL Final    Comment:    Performed at [facility], [place] [address] [place], [place] [address]     Radiology/Studies:  Dg Chest 2 View  Result Date: 01/23/2019  CLINICAL DATA:  Chest pain EXAM: CHEST - 2 VIEW COMPARISON:  July 16, 2018 FINDINGS: Stable cardiomegaly. The hila, mediastinum, lungs, and pleura are otherwise unremarkable. IMPRESSION: No active cardiopulmonary disease. Electronically Signed   By: Dorise Bullion III M.D   On: 01/23/2019 13:27   Ct Abdomen Pelvis W Contrast  Result Date: 01/23/2019 CLINICAL DATA:  Acute epigastric abdominal pain. EXAM: CT ABDOMEN AND PELVIS WITH CONTRAST TECHNIQUE: Multidetector CT imaging of the abdomen and pelvis was  performed using the standard protocol following bolus administration of intravenous contrast. CONTRAST:  128m OMNIPAQUE IOHEXOL 300 MG/ML  SOLN COMPARISON:  CT scan dated 12/20/2011 FINDINGS: Lower chest: No acute abnormality. Hepatobiliary: No focal liver abnormality is seen. No gallstones, gallbladder wall thickening, or biliary dilatation. Pancreas: Unremarkable. No pancreatic ductal dilatation or surrounding inflammatory changes. Spleen: Normal in size without focal abnormality. Adrenals/Urinary Tract: Adrenal glands are unremarkable. Kidneys are normal, without renal calculi, focal lesion, or hydronephrosis. Bladder is unremarkable. Stomach/Bowel: Stomach is within normal limits. Appendix appears normal. No evidence of bowel wall thickening, distention, or inflammatory changes. Vascular/Lymphatic: Aortic atherosclerosis. No enlarged abdominal or pelvic lymph nodes. Reproductive: Prostate is unremarkable. Other: No abdominal wall hernia or abnormality. No abdominopelvic ascites. Musculoskeletal: No acute or significant osseous findings. IMPRESSION: Benign-appearing abdomen and pelvis. Aortic Atherosclerosis (ICD10-I70.0). Electronically Signed   By: JLorriane ShireM.D.   On: 01/23/2019 16:52    Assessment and Plan:   1.  Chest pain with moderate risk for cardiac etiology: - He has a long history of chest pain with no obstructive coronary artery disease. -[place] but we have no details on this and no stent was seen in subsequent cath - Last cath was 2019, results are above, nonobstructive coronary artery disease - With chest wall tenderness, feel the symptoms are most likely musculoskeletal in origin - His troponin is mildly elevated, but he has no significant volume overload by exam, continue to follow  2.  Chronic systolic CHF: -His creatinine is above his baseline, he may need mild hydration. - Follow daily weights, strict intake/output and continue home medications. - He is not currently on a diuretic at home, but does not seem to need one  Otherwise, per IM Principal Problem:   Abdominal pain Active Problems:   Chronic systolic heart failure (HCC)   Non-cardiac chest pain    Signed, RRosaria Ferries [name]  01/23/2019 6:37 PM  ATTENDING ATTESTATION  I have seen, examined and evaluated the patient this PM along with RRosaria Ferries [name] .  After reviewing all the available data and chart, we discussed the patients laboratory, study & physical findings as well as symptoms in detail. I agree with her findings, examination as well as impression recommendations as per our discussion.    NKaanhas a known history of nonischemic cardiomyopathy with cardiac catheterization about a year and a half ago showing minimal disease.  He presents here with a conglomeration of symptoms it makes me feel like he probably has some type of viral syndrome.  Does have any fevers or chills.  No cough or acute shortness of breath but he has a general achy sensation, he describes a chest discomfort but it is actually relatively reproducible on exam right at the lower sternal border bilaterally.  Simply rubbing my finger across it causes him to have discomfort.  He also is noticing some abdominal pain and nausea with one episode of emesis.  Abdominal CT scan did not show anything significant. EKG is relatively unremarkable as is exam with exception of  reproducible chest pain.  [facility] does [facility], however his symptoms really do not sound cardiac as far as ACS in nature.  This chest discomfort is not new.  It is not necessarily made worse with exertion, it happens at rest and does not get worse with exertion.  Very atypical for anginal type symptoms.  Recommendation will be to admit for  medicine and evaluate potential viral illnesses.  Certainly follow troponin trend, and if there is concern we could consider potential ischemic evaluation with Myoview stress test versus coronary CTA, however if it remains this level less inclined to evaluate.  We will follow along. Continue baseline cardiac medications    Glenetta Hew, M.D., M.S. Interventional Cardiologist   Pager # [phone] Phone # [phone] [address] [place], [place] [address]      For questions or updates, please contact [place] Please consult [email or web address] for contact info under Cardiology/STEMI.

## 2019-01-23 NOTE — ED Provider Notes (Signed)
[facility] EMERGENCY DEPARTMENT Provider Note   CSN: [id number] Arrival date & time: 01/23/19  1229     History   Chief Complaint Chief Complaint  Patient presents with  . Chest Pain    HPI Calvin [name] is a 58 y.o. male.     Patient with history of non-ischemic cardiomyopathy with history of combined systolic and diastolic heart failure, diabetes, hypertension, coronary catheterization showing mild diffuse disease in 09/2017, afib on Eliquis -- presents the emergency department with complaint of chest pain, abdominal pain, nausea.  Patient states that he was awake approximately 6 or 7 hours ago and developed the symptoms.  No syncope.  He states that he broke out in a rash on his left arm.  No diarrhea or fever.  No cough.  He states that he felt short of breath.  No treatment prior to arrival.  The onset of this condition was acute. The course is constant. Aggravating factors: none. Alleviating factors: none.       Past Medical History:  Diagnosis Date  . Adenoidal hypertrophy   . Adenomatous colon polyps 2012  . Arthritis   . Diabetes mellitus   . GERD (gastroesophageal reflux disease)   . Heart [place]    [place]  [name] Hx of adenomatous colonic polyps 08/18/2017  . Hyperlipidemia   . Hypertension   . Mild CAD    a. cath in 08/2017 showing mild nonobstructive CAD with scattered 20-30% stenosis.   . Nonischemic cardiomyopathy ([place])    a. EF 20-25% by echo in 09/2017 with cath showing mild CAD. b.  Last echo 12/2017 EF 35-40%, grade 2 DD.  [name] Obesity   . PAF (paroxysmal atrial fibrillation) ([place])   . Sleep apnea    cpap    Patient Active Problem List   Diagnosis Date Noted  . Right leg pain 12/20/2018  . Overgrown toenails 12/20/2018  . Unsteady gait 12/20/2018  . Encounter for therapeutic drug monitoring 10/22/2018  . Nausea and vomiting 03/10/2018  . AF (paroxysmal atrial fibrillation) ([place]) 03/10/2018  . Non-cardiac chest  pain 03/09/2018  . Diabetic neuropathy ([place]) 09/26/2017  . MCI (mild cognitive impairment) 06/23/2017  . OSA on CPAP 05/10/2017  . Gastroesophageal reflux disease 05/10/2017  . Insomnia 05/10/2017  . Onychomycosis of multiple toenails with type 2 diabetes mellitus and peripheral neuropathy ([place]) 05/10/2017  . Chronic systolic heart failure ([place]) 11/22/2015  . Essential hypertension 11/22/2015  . DM (diabetes mellitus) ([place]) 11/20/2015  . HLD (hyperlipidemia) 11/20/2015  . Hx of adenomatous colonic polyps 05/19/2011    Past Surgical History:  Procedure Laterality Date  . COLONOSCOPY  05/13/11   9 adenomas  . FOREARM SURGERY    . MUSCLE BIOPSY    . RIGHT/LEFT HEART CATH AND CORONARY ANGIOGRAPHY N/A 08/25/2017   Procedure: RIGHT/LEFT HEART CATH AND CORONARY ANGIOGRAPHY;  Surgeon: [name], MD;  Location: [facility];  Service: Cardiovascular;  Laterality: N/A;        Home Medications    Prior to Admission medications   Medication Sig Start Date End Date Taking? Authorizing Provider  amitriptyline (ELAVIL) 10 MG tablet Take 1 tablet at bed time for one week and then increase 2 tablets at bed time Patient taking differently: 20 mg. Take 1 tablet at bed time for one week and then increase 2 tablets at bed time 11/28/18   Cameron Sprang, MD  apixaban (ELIQUIS) 5 MG TABS tablet Take 5 mg by mouth  2 (two) times daily.    [provider]  atorvastatin (LIPITOR) 80 MG tablet Take 1 tablet (80 mg total) by mouth daily. 11/28/18 02/26/19  Larey Dresser, MD  carvedilol (COREG) 6.25 MG tablet Take 1 tablet (6.25 mg total) by mouth 2 (two) times daily with a meal. 09/27/18   Larey Dresser, MD  donepezil (ARICEPT) 10 MG tablet Take 1 tablet (10 mg total) by mouth at bedtime. 09/18/18   Cameron Sprang, MD  Evolocumab (REPATHA SURECLICK) 527 MG/ML SOAJ Inject 1 pen into the skin every 14 (fourteen) days. Patient not taking: Reported on 01/23/2019 01/18/19   Larey Dresser, MD  Insulin Glargine, 2 Unit Dial, (TOUJEO MAX SOLOSTAR) 300 UNIT/ML SOPN Inject 200 Units into the skin every morning. Dx Code: E11.9 Patient not taking: Reported on 01/23/2019 12/13/18   [name], MD  Insulin Syringe-Needle U-100 (INSULIN SYRINGE .3CC/29GX1/2") 29G X 1/2" 0.3 ML MISC Inject 1 Syringe 3 (three) times daily as directed. Check blood sugar three times daily. Dx: E11.9 05/10/17   [name], DO  lisinopril (PRINIVIL,ZESTRIL) 10 MG tablet Take 1 tablet (10 mg total) by mouth at bedtime. 09/27/18   Larey Dresser, MD  nitroGLYCERIN (NITROSTAT) 0.4 MG SL tablet Place 1 tablet (0.4 mg total) under the tongue every 5 (five) minutes as needed for chest pain. Patient not taking: Reported on 12/27/2018 10/27/17   [name], DO  spironolactone (ALDACTONE) 25 MG tablet Take 0.5 tablets (12.5 mg total) by mouth daily. 11/28/18 02/26/19  Larey Dresser, MD    Family History Family History  Problem Relation Age of Onset  . Heart disease Mother   . Heart attack Mother 69  . Hypertension Mother   . Hyperlipidemia Mother   . Diabetes Mother   . Heart disease Father   . Heart attack Father 68  . Hypertension Father   . Hyperlipidemia Father   . Diabetes Father   . Heart attack Sister 57  . Colon cancer Neg Hx   . Stomach cancer Neg Hx   . Esophageal cancer Neg Hx   . Rectal cancer Neg Hx     Social History Social History   Tobacco Use  . Smoking status: Never Smoker  . Smokeless tobacco: Never Used  Substance Use Topics  . Alcohol use: No  . Drug use: No     Allergies   Patient has no known allergies.   Review of Systems Review of Systems  Constitutional: Negative for fever.  HENT: Negative for rhinorrhea and sore throat.   Eyes: Negative for redness.  Respiratory: Negative for cough.   Cardiovascular: Positive for chest pain.  Gastrointestinal: Positive for nausea and vomiting. Negative for abdominal pain and diarrhea.  Genitourinary: Negative  for dysuria.  Musculoskeletal: Positive for myalgias.  Skin: Positive for rash.  Neurological: Negative for headaches.     Physical Exam Updated Vital Signs BP 95/70 (BP Location: Right Arm)   Pulse (!) 103   Temp 97.9 F (36.6 C) (Oral)   Resp (!) 22   SpO2 100%   Physical Exam Vitals signs and nursing note reviewed.  Constitutional:      Appearance: He is well-developed.  HENT:     Head: Normocephalic and atraumatic.  Eyes:     General:        Right eye: No discharge.        Left eye: No discharge.     Conjunctiva/sclera: Conjunctivae normal.  Neck:  Musculoskeletal: Normal range of motion and neck supple.  Cardiovascular:     Rate and Rhythm: Normal rate and regular rhythm.     Heart sounds: Normal heart sounds.  Pulmonary:     Effort: Pulmonary effort is normal.     Breath sounds: Normal breath sounds.  Abdominal:     Palpations: Abdomen is soft.     Tenderness: There is abdominal tenderness (mild) in the right upper quadrant, right lower quadrant, epigastric area and periumbilical area. There is no guarding or rebound.  Skin:    General: Skin is warm and dry.  Neurological:     Mental Status: He is alert.      ED Treatments / Results  Labs (all labs ordered are listed, but only abnormal results are displayed) Labs Reviewed  BASIC METABOLIC PANEL - Abnormal; Notable for the following components:      Result Value   CO2 19 (*)    Glucose, Bld 424 (*)    Creatinine, Ser 1.45 (*)    Calcium 8.7 (*)    GFR calc non Af Amer 53 (*)    All other components within normal limits  CBC - Abnormal; Notable for the following components:   WBC 11.5 (*)    All other components within normal limits  TROPONIN I (HIGH SENSITIVITY) - Abnormal; Notable for the following components:   Troponin I (High Sensitivity) 28 (*)    All other components within normal limits  HEPATIC FUNCTION PANEL  LIPASE, BLOOD  URINALYSIS, ROUTINE W REFLEX MICROSCOPIC  TROPONIN I (HIGH  SENSITIVITY)    EKG EKG Interpretation  Date/Time:  Wednesday January 23 2019 12:37:45 EDT Ventricular Rate:  105 PR Interval:  156 QRS Duration: 78 QT Interval:  370 QTC Calculation: 489 R Axis:   6 Text Interpretation:  Sinus tachycardia Nonspecific ST and T wave abnormality Abnormal ECG when compared to prior, faster rate.  No STEMI Confirmed by Antony Blackbird [phone]) on 01/23/2019 12:59:50 PM   Radiology Dg Chest 2 View  Result Date: 01/23/2019 CLINICAL DATA:  Chest pain EXAM: CHEST - 2 VIEW COMPARISON:  July 16, 2018 FINDINGS: Stable cardiomegaly. The hila, mediastinum, lungs, and pleura are otherwise unremarkable. IMPRESSION: No active cardiopulmonary disease. Electronically Signed   By: Dorise Bullion III M.D   On: 01/23/2019 13:27    Procedures Procedures (including critical care time)  Medications Ordered in ED Medications  sodium chloride flush (NS) 0.9 % injection 3 mL (0 mLs Intravenous Hold 01/23/19 1334)  fentaNYL (SUBLIMAZE) injection 100 mcg (has no administration in time range)  sodium chloride 0.9 % bolus 500 mL (500 mLs Intravenous New Bag/Given 01/23/19 1450)     Initial Impression / Assessment and Plan / ED Course  I have reviewed the triage vital signs and the nursing notes.  Pertinent labs & imaging results that were available during my care of the patient were reviewed by me and considered in my medical decision making (see chart for details).        Patient seen and examined. Work-up initiated. Medications ordered. Non-specific findings on EKG but no changes.   Vital signs reviewed and are as follows: BP 95/70 (BP Location: Right Arm)   Pulse (!) 103   Temp 97.9 F (36.6 C) (Oral)   Resp (!) 22   SpO2 100%   3:25 PM troponin with mild elevation, not unexpected given heart failure history however will need second marker change.  Hyperglycemia without DKA noted.  On reexam, patient complaining of pain  in the tops of the bilateral feet.  He  continues to wince in pain with palpation on the abdomen.  Will obtain CT imaging given significant amount of tenderness.  Signed out to [employer] at shift change.   Plan: repeat marker and EKG. Follow-up on CT results.   BP (!) 145/90   Pulse 92   Temp 97.9 F (36.6 C) (Oral)   Resp 13   SpO2 100%    Final Clinical Impressions(s) / ED Diagnoses   Final diagnoses:  Precordial pain  Generalized abdominal pain   CP/abd pain, unclear cause.   ED Discharge Orders    None       Carlisle Cater, Hershal Coria 01/23/19 1537    Tegeler, Gwenyth Allegra, MD 01/23/19 [phone]

## 2019-01-23 NOTE — ED Notes (Signed)
ED TO INPATIENT HANDOFF REPORT  ED Nurse Name and Phone #: [phone]  S Name/Age/Gender Calvin [name] 58 y.o. male Room/Bed: 013C/013C  Code Status   Code Status: Prior  Home/SNF/Other Home Patient oriented to: self, place, time and situation Is this baseline? Yes   Triage Complete: Triage complete  Chief Complaint CP  Triage Note Onset 5 hours ago intermittant epigastric pain.  Pt thought it was acid reflux, did not take any medications.  Pt reports he tried to vomit few times and small amount brown liquid came up.    Allergies No Known Allergies  Level of Care/Admitting Diagnosis ED Disposition    ED Disposition Condition [facility] Area: [facility] [[id number]]  Level of Care: Telemetry Cardiac [103]  I expect the patient will be discharged within 24 hours: Yes  LOW acuity---Tx typically complete <24 hrs---ACUTE conditions typically can be evaluated <24 hours---LABS likely to return to acceptable levels <24 hours---IS near functional baseline---EXPECTED to return to current living arrangement---NOT newly hypoxic: Meets criteria for 5C-Observation unit  Covid Evaluation: N/A  Diagnosis: Chest pain [163845]  Admitting Physician: Manfred Shirts  Attending Physician: [facility] Labs, DAWOOD S [[id number]]  PT Class (Do Not Modify): Observation [104]  PT Acc Code (Do Not Modify): Observation [10022]       B Medical/Surgery History Past Medical History:  Diagnosis Date  . Adenoidal hypertrophy   . Adenomatous colon polyps 2012  . Arthritis   . Diabetes mellitus   . GERD (gastroesophageal reflux disease)   . Heart attack ([place])    [place]  [name] Hx of adenomatous colonic polyps 08/18/2017  . Hyperlipidemia   . Hypertension   . Mild CAD    a. cath in 08/2017 showing mild nonobstructive CAD with scattered 20-30% stenosis.   . Nonischemic cardiomyopathy ([place])    a. EF 20-25% by echo in 09/2017 with cath showing mild CAD. b.   Last echo 12/2017 EF 35-40%, grade 2 DD.  [name] Obesity   . PAF (paroxysmal atrial fibrillation) ([place])   . Sleep apnea    cpap, pt says no longer has   Past Surgical History:  Procedure Laterality Date  . COLONOSCOPY  05/13/11   9 adenomas  . FOREARM SURGERY    . MUSCLE BIOPSY    . RIGHT/LEFT HEART CATH AND CORONARY ANGIOGRAPHY N/A 08/25/2017   Procedure: RIGHT/LEFT HEART CATH AND CORONARY ANGIOGRAPHY;  Surgeon: [name], MD;  Location: [facility];  Service: Cardiovascular;  Laterality: N/A;     A IV Location/Drains/Wounds Patient Lines/Drains/Airways Status   Active Line/Drains/Airways    Name:   Placement date:   Placement time:   Site:   Days:   Peripheral IV 01/23/19 Right Hand   01/23/19    1445    Hand   less than 1          Intake/Output Last 24 hours No intake or output data in the 24 hours ending 01/23/19 1937  Labs/Imaging Results for orders placed or performed during the hospital encounter of 01/23/19 (from the past 48 hour(s))  Basic metabolic panel     Status: Abnormal   Collection Time: 01/23/19 12:53 PM  Result Value Ref Range   Sodium 135 135 - 145 mmol/L   Potassium 3.7 3.5 - 5.1 mmol/L   Chloride 103 98 - 111 mmol/L   CO2 19 (L) 22 - 32 mmol/L   Glucose, Bld 424 (H) 70 - 99 mg/dL  BUN 14 6 - 20 mg/dL   Creatinine, Ser 1.45 (H) 0.61 - 1.24 mg/dL   Calcium 8.7 (L) 8.9 - 10.3 mg/dL   GFR calc non Af Amer 53 (L) >60 mL/min   GFR calc Af Amer >60 >60 mL/min   Anion gap 13 5 - 15    Comment: Performed at [facility] [address], [place], [place] [address]  CBC     Status: Abnormal   Collection Time: 01/23/19 12:53 PM  Result Value Ref Range   WBC 11.5 (H) 4.0 - 10.5 K/uL   RBC 5.15 4.22 - 5.81 MIL/uL   Hemoglobin 16.0 13.0 - 17.0 g/dL   HCT 46.8 39.0 - 52.0 %   MCV 90.9 80.0 - 100.0 fL   MCH 31.1 26.0 - 34.0 pg   MCHC 34.2 30.0 - 36.0 g/dL   RDW 11.9 11.5 - 15.5 %   Platelets 255 150 - 400 K/uL   nRBC 0.0 0.0 - 0.2 %     Comment: Performed at [facility], [place] [address], [place], [place] [address]  Troponin I (High Sensitivity)     Status: Abnormal   Collection Time: 01/23/19 12:53 PM  Result Value Ref Range   Troponin I (High Sensitivity) 28 (H) <18 ng/L    Comment: (NOTE) Elevated high sensitivity troponin I (hsTnI) values and significant  changes across serial measurements may suggest ACS but many other  chronic and acute conditions are known to elevate hsTnI results.  Refer to the "Links" section for chest pain algorithms and additional  guidance. Performed at [facility], [place] [address] [place], [place] [address]   Troponin I (High Sensitivity)     Status: Abnormal   Collection Time: 01/23/19  2:48 PM  Result Value Ref Range   Troponin I (High Sensitivity) 61 (H) <18 ng/L    Comment: RESULT CALLED TO, READ BACK BY AND VERIFIED WITH: C CHRISCO,RN 1603 01/23/2019 D BRADLEY (NOTE) Elevated high sensitivity troponin I (hsTnI) values and significant  changes across serial measurements may suggest ACS but many other  chronic and acute conditions are known to elevate hsTnI results.  Refer to the Links section for chest pain algorithms and additional  guidance. Performed at [facility], [place] [address] [place], [place] [address]   Hepatic function panel     Status: Abnormal   Collection Time: 01/23/19  3:00 PM  Result Value Ref Range   Total Protein 6.4 (L) 6.5 - 8.1 g/dL   Albumin 3.5 3.5 - 5.0 g/dL   AST 21 15 - 41 U/L   ALT 20 0 - 44 U/L   Alkaline Phosphatase 63 38 - 126 U/L   Total Bilirubin 0.5 0.3 - 1.2 mg/dL   Bilirubin, Direct 0.1 0.0 - 0.2 mg/dL   Indirect Bilirubin 0.4 0.3 - 0.9 mg/dL    Comment: Performed at [facility] [address] [place], [place] [address]  Lipase, blood     Status: None   Collection Time: 01/23/19  3:00 PM  Result Value Ref Range   Lipase 31 11 - 51 U/L    Comment: Performed at [facility] [address],  [place], [place] [address]  CBG monitoring, ED     Status: Abnormal   Collection Time: 01/23/19  4:02 PM  Result Value Ref Range   Glucose-Capillary 293 (H) 70 - 99 mg/dL   Dg Chest 2 View  Result Date: 01/23/2019 CLINICAL  DATA:  Chest pain EXAM: CHEST - 2 VIEW COMPARISON:  July 16, 2018 FINDINGS: Stable cardiomegaly. The hila, mediastinum, lungs, and pleura are otherwise unremarkable. IMPRESSION: No active cardiopulmonary disease. Electronically Signed   By: Dorise Bullion III M.D   On: 01/23/2019 13:27   Ct Abdomen Pelvis W Contrast  Result Date: 01/23/2019 CLINICAL DATA:  Acute epigastric abdominal pain. EXAM: CT ABDOMEN AND PELVIS WITH CONTRAST TECHNIQUE: Multidetector CT imaging of the abdomen and pelvis was performed using the standard protocol following bolus administration of intravenous contrast. CONTRAST:  159m OMNIPAQUE IOHEXOL 300 MG/ML  SOLN COMPARISON:  CT scan dated 12/20/2011 FINDINGS: Lower chest: No acute abnormality. Hepatobiliary: No focal liver abnormality is seen. No gallstones, gallbladder wall thickening, or biliary dilatation. Pancreas: Unremarkable. No pancreatic ductal dilatation or surrounding inflammatory changes. Spleen: Normal in size without focal abnormality. Adrenals/Urinary Tract: Adrenal glands are unremarkable. Kidneys are normal, without renal calculi, focal lesion, or hydronephrosis. Bladder is unremarkable. Stomach/Bowel: Stomach is within normal limits. Appendix appears normal. No evidence of bowel wall thickening, distention, or inflammatory changes. Vascular/Lymphatic: Aortic atherosclerosis. No enlarged abdominal or pelvic lymph nodes. Reproductive: Prostate is unremarkable. Other: No abdominal wall hernia or abnormality. No abdominopelvic ascites. Musculoskeletal: No acute or significant osseous findings. IMPRESSION: Benign-appearing abdomen and pelvis. Aortic Atherosclerosis (ICD10-I70.0). Electronically Signed   By: JLorriane ShireM.D.   On: 01/23/2019 16:52     Pending Labs Unresulted Labs (From admission, onward)    Start     Ordered   01/23/19 1921  SARS Coronavirus 2 (CE[facility]HEID - [facility]  Once,   STAT    Question:  Rule Out  Answer:  Yes   01/23/19 1921   01/23/19 1348  Urinalysis, Routine w reflex microscopic  Once,   STAT     01/23/19 1347          Vitals/Pain Today's Vitals   01/23/19 1545 01/23/19 1548 01/23/19 1552 01/23/19 1805  BP: 138/72   130/81  Pulse: 88   89  Resp: 16   (!) 21  Temp:      TempSrc:      SpO2: 100%   100%  PainSc:  10-Worst pain ever 10-Worst pain ever     Isolation Precautions No active isolations  Medications Medications  sodium chloride flush (NS) 0.9 % injection 3 mL (0 mLs Intravenous Hold 01/23/19 1334)  0.9 %  sodium chloride infusion ( Intravenous New Bag/Given 01/23/19 1903)  sodium chloride 0.9 % bolus 500 mL (0 mLs Intravenous Stopped 01/23/19 1547)  fentaNYL (SUBLIMAZE) injection 100 mcg (100 mcg Intravenous Given 01/23/19 1547)  iohexol (OMNIPAQUE) 300 MG/ML solution 100 mL (100 mLs Intravenous Contrast Given 01/23/19 1629)  aspirin chewable tablet 324 mg (324 mg Oral Given 01/23/19 1818)    Mobility walks Low fall risk   Focused Assessments Cardiac Assessment Handoff:  Cardiac Rhythm: Normal sinus rhythm Lab Results  Component Value Date   CKTOTAL 511 (H) 07/17/2018   CKMB 2.8 12/20/2011   TROPONINI <0.03 03/10/2018   Lab Results  Component Value Date   DDIMER <0.27 07/16/2018   Does the Patient currently have chest pain? Yes , chest pain tender on palpation, cardiology was consulted     R Recommendations: See Admitting Provider Note  Report given to:   Additional Notes:

## 2019-01-23 NOTE — Progress Notes (Addendum)
Paramedicine Encounter    Patient ID: Calvin [name], male    DOB: 21-Sep-1960, 58 y.o.   MRN: [id number]   [name], NP as PCP - General (Geriatric Medicine) [name], MD as PCP - Cardiology (Cardiology) [name], [name], MD as PCP - Advanced Heart Failure (Cardiology) [name], MD as Consulting Physician (Endocrinology)  Patient Active Problem List   Diagnosis Date Noted  . Right leg pain 12/20/2018  . Overgrown toenails 12/20/2018  . Unsteady gait 12/20/2018  . Encounter for therapeutic drug monitoring 10/22/2018  . Nausea and vomiting 03/10/2018  . AF (paroxysmal atrial fibrillation) ([place]) 03/10/2018  . Non-cardiac chest pain 03/09/2018  . Diabetic neuropathy ([place]) 09/26/2017  . MCI (mild cognitive impairment) 06/23/2017  . OSA on CPAP 05/10/2017  . Gastroesophageal reflux disease 05/10/2017  . Insomnia 05/10/2017  . Onychomycosis of multiple toenails with type 2 diabetes mellitus and peripheral neuropathy ([place]) 05/10/2017  . Chronic systolic heart failure ([place]) 11/22/2015  . Essential hypertension 11/22/2015  . DM (diabetes mellitus) ([place]) 11/20/2015  . HLD (hyperlipidemia) 11/20/2015  . Hx of adenomatous colonic polyps 05/19/2011   No current facility-administered medications for this visit.   Current Outpatient Medications:  .  amitriptyline (ELAVIL) 10 MG tablet, Take 1 tablet at bed time for one week and then increase 2 tablets at bed time (Patient taking differently: 20 mg. Take 1 tablet at bed time for one week and then increase 2 tablets at bed time), Disp: 60 tablet, Rfl: 3 .  apixaban (ELIQUIS) 5 MG TABS tablet, Take 5 mg by mouth 2 (two) times daily., Disp: , Rfl:  .  atorvastatin (LIPITOR) 80 MG tablet, Take 1 tablet (80 mg total) by mouth daily., Disp: 90 tablet, Rfl: 3 .  carvedilol (COREG) 6.25 MG tablet, Take 1 tablet (6.25 mg total) by mouth 2 (two) times daily with a meal., Disp: 180 tablet, Rfl: 3 .   donepezil (ARICEPT) 10 MG tablet, Take 1 tablet (10 mg total) by mouth at bedtime., Disp: 90 tablet, Rfl: 3 .  Insulin Syringe-Needle U-100 (INSULIN SYRINGE .3CC/29GX1/2") 29G X 1/2" 0.3 ML MISC, Inject 1 Syringe 3 (three) times daily as directed. Check blood sugar three times daily. Dx: E11.9, Disp: 100 each, Rfl: 3 .  lisinopril (PRINIVIL,ZESTRIL) 10 MG tablet, Take 1 tablet (10 mg total) by mouth at bedtime., Disp: 90 tablet, Rfl: 3 .  spironolactone (ALDACTONE) 25 MG tablet, Take 0.5 tablets (12.5 mg total) by mouth daily., Disp: 45 tablet, Rfl: 3 .  Evolocumab (REPATHA SURECLICK) 259 MG/ML SOAJ, Inject 1 pen into the skin every 14 (fourteen) days. (Patient not taking: Reported on 01/23/2019), Disp: 6 pen, Rfl: 3 .  Insulin Glargine, 2 Unit Dial, (TOUJEO MAX SOLOSTAR) 300 UNIT/ML SOPN, Inject 200 Units into the skin every morning. Dx Code: E11.9 (Patient not taking: Reported on 01/23/2019), Disp: 10 pen, Rfl: 2 .  nitroGLYCERIN (NITROSTAT) 0.4 MG SL tablet, Place 1 tablet (0.4 mg total) under the tongue every 5 (five) minutes as needed for chest pain. (Patient not taking: Reported on 12/27/2018), Disp: 30 tablet, Rfl: 1  Facility-Administered Medications Ordered in Other Visits:  .  sodium chloride flush (NS) 0.9 % injection 3 mL, 3 mL, Intravenous, Once, Tegeler, Gwenyth Allegra, MD No Known Allergies    Social History   Socioeconomic History  . Marital status: Single    Spouse name: Not on file  . Number of children: 0  . Years of education: Not on file  .  Highest education level: Not on file  Occupational History  . Occupation: Disability  Social Needs  . Financial resource strain: Not on file  . Food insecurity    Worry: Not on file    Inability: Not on file  . Transportation needs    Medical: Not on file    Non-medical: Not on file  Tobacco Use  . Smoking status: Never Smoker  . Smokeless tobacco: Never Used  Substance and Sexual Activity  . Alcohol use: No  . Drug use: No   . Sexual activity: Yes    Birth control/protection: None  Lifestyle  . Physical activity    Days per week: Not on file    Minutes per session: Not on file  . Stress: Not on file  Relationships  . Social Herbalist on phone: Not on file    Gets together: Not on file    Attends religious service: Not on file    Active member of club or organization: Not on file    Attends meetings of clubs or organizations: Not on file    Relationship status: Not on file  . Intimate partner violence    Fear of current or ex partner: Not on file    Emotionally abused: Not on file    Physically abused: Not on file    Forced sexual activity: Not on file  Other Topics Concern  . Not on file  Social History Narrative   Social History      Diet?       Do you drink/eat things with caffeine? yes      Marital status?       single                             What year were you married?      Do you live in a house, apartment, assisted living, condo, trailer, etc.? yes      Is it one or more stories? One story      How many persons live in your home?      Do you have any pets in your home? (please list) none      Highest level of education completed? graduate      Current or past profession:      Do you exercise?            no                          Type & how often?      Advanced Directives      Do you have a living will? no      Do you have a DNR form?                                  If not, do you want to discuss one? no      Do you have signed POA/HPOA for forms? no      Functional Status      Do you have difficulty bathing or dressing yourself? no      Do you have difficulty preparing food or eating? no      Do you have difficulty managing your medications? no      Do you have difficulty managing your finances? no  Do you have difficulty affording your medications? no    Physical Exam      Future Appointments  Date Time Provider [place]   02/04/2019 11:00 AM [name], NP [facility] None  04/22/2019  3:00 PM Marzetta Board, DPM [facility]    BP 110/70   Pulse 82   Temp (!) 97.2 F (36.2 C)   Resp 16   Wt 160 lb (72.6 kg)   SpO2 97%   BMI 27.46 kg/m   Weight yesterday-162 Last visit weight-162 CBG PTA-420  Pt reports he did yard work yesterday evening and has been itching all over ever since, he has tiny bumps all over his back, neck and arms. It does look like possible poison ivy.  He did report that he has this reaction before and its the same as now.  He states he is going to go to pharmacy after I leave to get some benadryl and some poison ivy body wash soap.  He denies sob, no dizziness, no c/p,  Headaches come and go.  No missed doses of his meds. meds verified and pill box refilled.  No swelling, weight staying stable.    Marylouise Stacks, [facility] Paramedic  01/23/19

## 2019-01-23 NOTE — Progress Notes (Signed)
Subjective: Calvin [name] is a 58 y.o. y.o. male who presents today for preventative diabetic foot care with cc of painful, discolored, thick toenails and b/l calluses which interfere with daily activities. Pain is aggravated when wearing enclosed shoe gear and relieved with periodic professional debridement.  [name], NP is his PCP.   He is followed by Dr. [name].   Current Outpatient Medications:  .  amitriptyline (ELAVIL) 10 MG tablet, Take 1 tablet at bed time for one week and then increase 2 tablets at bed time (Patient taking differently: 20 mg. Take 1 tablet at bed time for one week and then increase 2 tablets at bed time), Disp: 60 tablet, Rfl: 3 .  apixaban (ELIQUIS) 5 MG TABS tablet, Take 5 mg by mouth 2 (two) times daily., Disp: , Rfl:  .  atorvastatin (LIPITOR) 80 MG tablet, Take 1 tablet (80 mg total) by mouth daily., Disp: 90 tablet, Rfl: 3 .  carvedilol (COREG) 6.25 MG tablet, Take 1 tablet (6.25 mg total) by mouth 2 (two) times daily with a meal., Disp: 180 tablet, Rfl: 3 .  donepezil (ARICEPT) 10 MG tablet, Take 1 tablet (10 mg total) by mouth at bedtime., Disp: 90 tablet, Rfl: 3 .  Evolocumab (REPATHA SURECLICK) 196 MG/ML SOAJ, Inject 1 pen into the skin every 14 (fourteen) days., Disp: 6 pen, Rfl: 3 .  Insulin Glargine, 2 Unit Dial, (TOUJEO MAX SOLOSTAR) 300 UNIT/ML SOPN, Inject 200 Units into the skin every morning. Dx Code: E11.9, Disp: 10 pen, Rfl: 2 .  Insulin Syringe-Needle U-100 (INSULIN SYRINGE .3CC/29GX1/2") 29G X 1/2" 0.3 ML MISC, Inject 1 Syringe 3 (three) times daily as directed. Check blood sugar three times daily. Dx: E11.9, Disp: 100 each, Rfl: 3 .  lisinopril (PRINIVIL,ZESTRIL) 10 MG tablet, Take 1 tablet (10 mg total) by mouth at bedtime., Disp: 90 tablet, Rfl: 3 .  nitroGLYCERIN (NITROSTAT) 0.4 MG SL tablet, Place 1 tablet (0.4 mg total) under the tongue every 5 (five) minutes as needed for chest pain. (Patient not taking: Reported on  12/27/2018), Disp: 30 tablet, Rfl: 1 .  spironolactone (ALDACTONE) 25 MG tablet, Take 0.5 tablets (12.5 mg total) by mouth daily., Disp: 45 tablet, Rfl: 3  No Known Allergies  Objective: Vascular Examination: Capillary refill time <3 seconds x 10 digits.  Dorsalis pedis pulses palpable b/l.  Posterior tibial pulses palpable b/l.  Digital hair absent x 10 digits.  Skin temperature gradient WNL b/l.  Dermatological Examination: Skin with normal turgor, texture and tone b/l.  Toenails 1-5 b/l discolored, thick, dystrophic with subungual debris and pain with palpation to nailbeds due to thickness of nails.  Hyperkeratotic lesion b/l hallux. No erythema, no edema, no drainage, no flocculence noted.   Musculoskeletal: Muscle strength 5/5 to all LE muscle groups b/l.  HAV with bunion deformity b/l  Neurological: Sensation intact 5/5 b/l with 10 gram monofilament.  Vibratory sensation diminished b/l.  Assessment: 1. Painful onychomycosis toenails 1-5 b/l 2.  Callus b/l hallux 3.  NIDDM with subjective neuropathy   Plan: 1. Continue diabetic foot care principles. Literature dispensed on today. 2. Toenails 1-5 b/l were debrided in length and girth without iatrogenic bleeding. 3. Hyperkeratotic lesion(s)  pared  B/l hallux with sterile scalpel blade without incident. 4. Patient to continue soft, supportive shoe gear daily. 5. Patient to report any pedal injuries to medical professional immediately. 6. Follow up 3 months.  7. Patient/POA to call should there be a concern in the interim.

## 2019-01-23 NOTE — ED Triage Notes (Signed)
Onset 5 hours ago intermittant epigastric pain.  Pt thought it was acid reflux, did not take any medications.  Pt reports he tried to vomit few times and small amount brown liquid came up.

## 2019-01-24 DIAGNOSIS — K219 Gastro-esophageal reflux disease without esophagitis: Secondary | ICD-10-CM | POA: Diagnosis not present

## 2019-01-24 DIAGNOSIS — I480 Paroxysmal atrial fibrillation: Secondary | ICD-10-CM

## 2019-01-24 DIAGNOSIS — I5022 Chronic systolic (congestive) heart failure: Secondary | ICD-10-CM | POA: Diagnosis not present

## 2019-01-24 DIAGNOSIS — Z794 Long term (current) use of insulin: Secondary | ICD-10-CM

## 2019-01-24 DIAGNOSIS — R072 Precordial pain: Secondary | ICD-10-CM | POA: Diagnosis not present

## 2019-01-24 DIAGNOSIS — R0789 Other chest pain: Secondary | ICD-10-CM | POA: Diagnosis not present

## 2019-01-24 DIAGNOSIS — I10 Essential (primary) hypertension: Secondary | ICD-10-CM | POA: Diagnosis not present

## 2019-01-24 DIAGNOSIS — R7989 Other specified abnormal findings of blood chemistry: Secondary | ICD-10-CM | POA: Diagnosis not present

## 2019-01-24 DIAGNOSIS — R079 Chest pain, unspecified: Secondary | ICD-10-CM | POA: Diagnosis not present

## 2019-01-24 DIAGNOSIS — E1140 Type 2 diabetes mellitus with diabetic neuropathy, unspecified: Secondary | ICD-10-CM

## 2019-01-24 DIAGNOSIS — R1013 Epigastric pain: Secondary | ICD-10-CM | POA: Diagnosis not present

## 2019-01-24 LAB — GLUCOSE, CAPILLARY
Glucose-Capillary: 176 mg/dL — ABNORMAL HIGH (ref 70–99)
Glucose-Capillary: 219 mg/dL — ABNORMAL HIGH (ref 70–99)
Glucose-Capillary: 215 mg/dL — ABNORMAL HIGH (ref 70–99)
Glucose-Capillary: 111 mg/dL — ABNORMAL HIGH (ref 70–99)

## 2019-01-24 LAB — BASIC METABOLIC PANEL
Anion gap: 10 (ref 5–15)
BUN: 14 mg/dL (ref 6–20)
CO2: 23 mmol/L (ref 22–32)
Calcium: 8.7 mg/dL — ABNORMAL LOW (ref 8.9–10.3)
Chloride: 105 mmol/L (ref 98–111)
Creatinine, Ser: 1.24 mg/dL (ref 0.61–1.24)
GFR calc Af Amer: >60 mL/min (ref >60)
GFR calc non Af Amer: >60 mL/min (ref >60)
Glucose, Bld: 211 mg/dL — ABNORMAL HIGH (ref 70–99)
Potassium: 3.7 mmol/L (ref 3.5–5.1)
Sodium: 138 mmol/L (ref 135–145)

## 2019-01-24 LAB — TROPONIN I (HIGH SENSITIVITY): Troponin I (High Sensitivity): 40 ng/L — ABNORMAL HIGH (ref <18)

## 2019-01-24 LAB — C-REACTIVE PROTEIN: CRP: 1.8 mg/dL — ABNORMAL HIGH

## 2019-01-24 LAB — SEDIMENTATION RATE: Sed Rate: 3 mm/hr (ref 0–16)

## 2019-01-24 MED ORDER — TRAMADOL HCL 50 MG PO TABS
50.0000 mg | ORAL_TABLET | Freq: Four times a day (QID) | ORAL | Status: DC | PRN
  Administered 2019-01-25: 50 mg via ORAL
  Filled 2019-01-24: qty 1

## 2019-01-24 MED ORDER — LIDOCAINE VISCOUS HCL 2 % MT SOLN
15.0000 mL | Freq: Once | OROMUCOSAL | Status: AC
  Administered 2019-01-24: 15 mL via ORAL
  Filled 2019-01-24: qty 15

## 2019-01-24 MED ORDER — DICLOFENAC SODIUM 1 % TD GEL
2.0000 g | Freq: Four times a day (QID) | TRANSDERMAL | Status: DC
  Administered 2019-01-24 – 2019-01-25 (×3): 2 g via TOPICAL
  Filled 2019-01-24: qty 100

## 2019-01-24 MED ORDER — ALUM & MAG HYDROXIDE-SIMETH 200-200-20 MG/5ML PO SUSP
30.0000 mL | Freq: Once | ORAL | Status: AC
  Administered 2019-01-24: 30 mL via ORAL
  Filled 2019-01-24: qty 30

## 2019-01-24 NOTE — Progress Notes (Addendum)
Progress Note  Patient Name: Calvin [name] Date of Encounter: 01/24/2019  Primary Cardiologist: [name], MD   Subjective   Patient is still feeling short of breath with sharp chest pain. Nothing has seemed to help the pain. Pain not worse on exertion. Chest wall tender to palpation. Also notes that his stomach is gurgling and uncomfortable.  Feels somewhat nauseated.  Inpatient Medications    Scheduled Meds:  amitriptyline  20 mg Oral QHS   apixaban  5 mg Oral BID   atorvastatin  80 mg Oral Daily   carvedilol  6.25 mg Oral BID WC   donepezil  10 mg Oral QHS   insulin aspart  0-5 Units Subcutaneous QHS   insulin aspart  0-9 Units Subcutaneous TID WC   insulin glargine  70 Units Subcutaneous BID   lisinopril  10 mg Oral QHS   sodium chloride flush  3 mL Intravenous Once   spironolactone  12.5 mg Oral Daily   Continuous Infusions:  PRN Meds: acetaminophen, nitroGLYCERIN, ondansetron (ZOFRAN) IV   Vital Signs    Vitals:   01/23/19 1930 01/23/19 2000 01/23/19 2104 01/24/19 0507  BP: 135/62 133/72 122/68 (!) 143/67  Pulse: 92 93 89 88  Resp: (!) 24 (!) 24    Temp:   97.7 F (36.5 C) 97.9 F (36.6 C)  TempSrc:   Oral Oral  SpO2: 100% 100% 100% 98%  Weight:    75.1 kg   No intake or output data in the 24 hours ending 01/24/19 0904 Last 3 Weights 01/24/2019 01/23/2019 01/16/2019  Weight (lbs) 165 lb 9.6 oz 160 lb 162 lb  Weight (kg) 75.116 kg 72.576 kg 73.483 kg      Telemetry    NSR, HR 80-90s with no arrhythmias noted - Personally Reviewed  ECG    No new - Personally Reviewed  Physical Exam   GEN: No acute distress.  Just looks somewhat ill-appearing but nontoxic Neck: No JVD Cardiac: RRR, no murmur, rubs, or gallops.  Normal S1 and S2 Respiratory: Clear to auscultation bilaterally. GI: Soft, nontender, non-distended  MS: No edema; No deformity; chest wall TTP -most notably around the lower sternal border left worse than  right. Neuro:  Nonfocal  Psych: Normal affect   Labs    High Sensitivity Troponin:   Recent Labs  Lab 01/23/19 1253 01/23/19 1448 01/23/19 2115 01/23/19 2310  TROPONINIHS 28* 61* 46* 40*      Cardiac EnzymesNo results for input(s): TROPONINI in the last 168 hours. No results for input(s): TROPIPOC in the last 168 hours.   Chemistry Recent Labs  Lab 01/23/19 1253 01/23/19 1500  NA 135  --   K 3.7  --   CL 103  --   CO2 19*  --   GLUCOSE 424*  --   BUN 14  --   CREATININE 1.45*  --   CALCIUM 8.7*  --   PROT  --  6.4*  ALBUMIN  --  3.5  AST  --  21  ALT  --  20  ALKPHOS  --  63  BILITOT  --  0.5  GFRNONAA 53*  --   GFRAA >60  --   ANIONGAP 13  --      Hematology Recent Labs  Lab 01/23/19 1253  WBC 11.5*  RBC 5.15  HGB 16.0  HCT 46.8  MCV 90.9  MCH 31.1  MCHC 34.2  RDW 11.9  PLT 255    BNPNo results for input(s): BNP, PROBNP  in the last 168 hours.   DDimer No results for input(s): DDIMER in the last 168 hours.   Radiology    Dg Chest 2 View  Result Date: 01/23/2019 CLINICAL DATA:  Chest pain EXAM: CHEST - 2 VIEW COMPARISON:  July 16, 2018 FINDINGS: Stable cardiomegaly. The hila, mediastinum, lungs, and pleura are otherwise unremarkable. IMPRESSION: No active cardiopulmonary disease. Electronically Signed   By: Dorise Bullion III M.D   On: 01/23/2019 13:27   Ct Abdomen Pelvis W Contrast  Result Date: 01/23/2019 CLINICAL DATA:  Acute epigastric abdominal pain. EXAM: CT ABDOMEN AND PELVIS WITH CONTRAST TECHNIQUE: Multidetector CT imaging of the abdomen and pelvis was performed using the standard protocol following bolus administration of intravenous contrast. CONTRAST:  162m OMNIPAQUE IOHEXOL 300 MG/ML  SOLN COMPARISON:  CT scan dated 12/20/2011 FINDINGS: Lower chest: No acute abnormality. Hepatobiliary: No focal liver abnormality is seen. No gallstones, gallbladder wall thickening, or biliary dilatation. Pancreas: Unremarkable. No pancreatic ductal  dilatation or surrounding inflammatory changes. Spleen: Normal in size without focal abnormality. Adrenals/Urinary Tract: Adrenal glands are unremarkable. Kidneys are normal, without renal calculi, focal lesion, or hydronephrosis. Bladder is unremarkable. Stomach/Bowel: Stomach is within normal limits. Appendix appears normal. No evidence of bowel wall thickening, distention, or inflammatory changes. Vascular/Lymphatic: Aortic atherosclerosis. No enlarged abdominal or pelvic lymph nodes. Reproductive: Prostate is unremarkable. Other: No abdominal wall hernia or abnormality. No abdominopelvic ascites. Musculoskeletal: No acute or significant osseous findings. IMPRESSION: Benign-appearing abdomen and pelvis. Aortic Atherosclerosis (ICD10-I70.0). Electronically Signed   By: JLorriane ShireM.D.   On: 01/23/2019 16:52    Cardiac Studies   Echo 12/2017: Moderate global reduction in LV systolic function; moderate  diastolic dysfunction. Left ventricle: The cavity size was normal. Wall thickness was   normal. Systolic function was moderately reduced. The estimated   ejection fraction was in the range of 35% to 40%. Diffuse   hypokinesis. Features are consistent with a pseudonormal left   ventricular filling pattern, with concomitant abnormal relaxation   and increased filling pressure (grade 2 diastolic dysfunction).   Doppler parameters are consistent with high ventricular filling   pressure. - Mitral valve: Calcified annulus. Mildly thickened leaflets .   R/L Heart Cath 08/2017 --  Mild non-obstructive CAD. Moderate LV systolic dysfunction. Non-ischemic cardiomyopathy w/ Normal filling pressures.    Prox RCA to Mid RCA lesion is 30% stenosed.  Mid RCA to Dist RCA lesion is 30% stenosed.  Prox Cx to Dist Cx lesion is 20% stenosed.  Mid LAD lesion is 30% stenosed.  There is mild to moderate left ventricular systolic dysfunction.  LV end diastolic pressure is mildly elevated.  The left  ventricular ejection fraction is 35-45% by visual estimate.  There is no mitral valve regurgitation.   Holter Monitor 03/2018 Sinus rhythm with one episode of tachycardia which appears to be sinus tachycardia Rare Premature ventricular contractions (10 PVCs during monitoring period) Rare Premature atrial contractions (3 PACs during monitoring period) No evidence of atrial fibrillation  Patient Profile     58y.o. male with a hx of NICM, S-CHF w/ EF 35-40% 12/2017 Echo, uncontrolled DM, HTN, GERD[place] on Coreg and Eliquis, intol Entresto who is being evaluated for chest pain.    Assessment & Plan   1. Atypical Chest pain/SOB -clearly reproducible on exam. -Denies pain with exertion. Chest wall is tender to palpation. Appears pain has been occurring a long time, 6-7 years.  -HS troponin 61>46>40, EGK with no  new ischemic changes --not consistent with ACS. -Cath last year showed nonobstructive CAD. Will not likely pursue further ischemic work-up -Patient currently has 9/10 sharp chest pain, worse with breathing. Associated with sob.  -Pain possibly MSK in origin. - Echo pending.  2. NICM/Chronic Systolic Heart Failure --echo pending; euvolemic on exam -12/2017 EF 35-40% with moderate diastolic dysfunction -07/930 R/L HC showed mild nonobstructive CAD -Continue carvedilol, Spironolactone, Statin -No LLE edema  3. Paroxysmal Afib -Reports he has not missed doses of Eliquis -Eliquis and Coreg -Currently in NSR  4. HTN -Lisinopril 81m BID  -Stable  5. CKD stage 3  -Appears Cr is around baseline-mild increase in creatinine.  Follow closely.  Could actually consider gentle hydration. -Continue to monitor  6. Hyperlipidemia -Atorvastatin 866mBID -LDL 177 06/15/2018 -Goal <70  7. DM2 -per IM -Uncontrolled, A1C 11.1 11/2018     Signed, Cadence H Ninfa MeekerPA-C  01/24/2019, 9:04 AM     ATTENDING ATTESTATION  I have seen, examined and  evaluated the patient this AM along with Cadence H Ninfa MeekerPA-C .  After reviewing all the available data and chart, we discussed the patients laboratory, study & physical findings as well as symptoms in detail. I agree with HER findings, examination as well as impression recommendations as per our discussion.    Attending adjustments noted in italics.   NaTannor Pyoname in with a conglomeration of symptoms it does not seem consistent with ACS or heart failure exacerbation.  He has mild increased creatinine, he has musculoskeletal chest pain and abdominal pain with nausea.  He generally does not feel well which is more consistent with a potential viral illness.  COVID -19 is negative.  History echocardiogram pending, as long as there is stable, I think no further cardiac evaluation is required.  He is on stable regimen although we need to monitor his renal function and potentially consider holding ACE inhibitor. He may benefit from gentle hydration given his decreased p.o. intake over the last few days.  For musculoskeletal chest pain, best treatment option would be an NSAID, however with his worsening renal function that is not a good option.  Could consider steroid pulse taper.  He does not feel very well, may very well benefit from another night of observation.    DaGlenetta HewM.D., M.S. Interventional Cardiologist   Pager # [phone] # [phone] [address] [id number] questions or updates, please contact [facility] consult [email or web address] for contact info under

## 2019-01-24 NOTE — Discharge Instructions (Addendum)

## 2019-01-24 NOTE — Care Management Obs Status (Signed)
[facility] NOTIFICATION   Patient Details  Name: Bing Duffey MRN: [id number] Date of Birth: [birth year]   Medicare Observation Status Notification Given:  Yes    Gerrianne Scale Emalynn Clewis, LCSW 01/24/2019, 4:19 PM

## 2019-01-24 NOTE — Evaluation (Signed)
Physical Therapy Evaluation Patient Details Name: Calvin [name] MRN: [id number] DOB: [birth year] Today's Date: 01/24/2019   History of Present Illness  58 y.o. male, with a hx of NICM, S-CHF w/ EF 35-40% 12/2017 Echo, uncontrolled DM, HTN, GERD, reported MI 2015 in [place], Afib 03/2018 on Coreg and Eliquis, intol Entresto  presenting today with complaints of chest pain , midsternal, across his chest EKG showing normal sinus rhythm, with some flattening of lateral ST waves, troponins trending up, cardiologist recommend admission for observation 01/23/19  Clinical Impression  PTA pt living with family member in 77nd story apartment with 15 steps to enter, independent in mobility and iADLs. Pt reports long standing R sided weakness RLE>RUE as well as bouts of dizziness. Pt dizziness is positional but is not orthostatic, positive for dizziness with finger follow. Pt is supervision for bed mobility and min guard for transfers and ambulation to recliner. PT requested PT Vestibular exam for tomorrow. PT currently recommends HHPT level rehab at discharge currently, but if dizziness clears pt may not need further PT services. PT will continue to follow acutely.     Follow Up Recommendations Home health PT    Equipment Recommendations  Rolling walker with 5" wheels    Recommendations for Other Services Other (comment)(Vestibular consult)     Precautions / Restrictions Precautions Precautions: None Restrictions Weight Bearing Restrictions: No      Mobility  Bed Mobility Overal bed mobility: Needs Assistance Bed Mobility: Supine to Sit     Supine to sit: Supervision     General bed mobility comments: supervision for safety, HoB elevated, use of bedrail to pull to EoB  Transfers Overall transfer level: Needs assistance Equipment used: None Transfers: Sit to/from Stand Sit to Stand: Min guard         General transfer comment: min guard for safety, good power up, increased dizziness  with standing, use of gaze stabilization and posterior support of calves on side of bed  Ambulation/Gait Ambulation/Gait assistance: Min guard Gait Distance (Feet): 3 Feet Assistive device: None Gait Pattern/deviations: Step-to pattern;Decreased step length - right;Decreased step length - left;Shuffle Gait velocity: slowed Gait velocity interpretation: <1.31 ft/sec, indicative of household ambulator General Gait Details: min guard for slow, shuffling stepping to recliner      Balance Overall balance assessment: Needs assistance Sitting-balance support: Feet supported;Single extremity supported Sitting balance-Leahy Scale: Poor     Standing balance support: No upper extremity supported;During functional activity Standing balance-Leahy Scale: Poor Standing balance comment: posterior lean on bed for stability                             Pertinent Vitals/Pain Pain Assessment: Faces Pain Score: 4  Pain Location: chest Pain Descriptors / Indicators: Constant;Aching Pain Intervention(s): Limited activity within patient's tolerance;Monitored during session;Repositioned    Home Living Family/patient expects to be discharged to:: Private residence Living Arrangements: Other relatives Available Help at Discharge: Family;Available 24 hours/day Type of Home: Apartment Home Access: Stairs to enter Entrance Stairs-Rails: Left Entrance Stairs-Number of Steps: 15 Home Layout: One level Home Equipment: Cane - single point      Prior Function Level of Independence: Independent                  Extremity/Trunk Assessment   Upper Extremity Assessment Upper Extremity Assessment: Overall WFL for tasks assessed    Lower Extremity Assessment Lower Extremity Assessment: RLE deficits/detail;LLE deficits/detail RLE Deficits / Details: ROM WFL, strength grossly 4/5  RLE Sensation: WNL RLE Coordination: WNL LLE Deficits / Details: ROM WFL,strength grossly 5/5 LLE  Sensation: WNL LLE Coordination: WNL       Communication   Communication: No difficulties  Cognition Arousal/Alertness: Awake/alert Behavior During Therapy: WFL for tasks assessed/performed Overall Cognitive Status: Within Functional Limits for tasks assessed                                        General Comments General comments (skin integrity, edema, etc.): Pt with increased dizziness with finger follow, possible nystagmus with rotation to L, no increased dizziness with head turns and fixed gaze. Pt also reports double vision for some time. Pt does not report any recent falls or blows to the head. Pt is not orthostatic BP supine 128/29 sitting 124/64 standing 126/65 .      Assessment/Plan    PT Assessment Patient needs continued PT services  PT Problem List Decreased strength;Decreased balance;Decreased mobility;Decreased activity tolerance       PT Treatment Interventions DME instruction;Gait training;Stair training;Functional mobility training;Therapeutic activities;Therapeutic exercise;Balance training;Cognitive remediation;Patient/family education    PT Goals (Current goals can be found in the Care Plan section)  Acute Rehab PT Goals Patient Stated Goal: be able to walk for recreation again PT Goal Formulation: With patient Time For Goal Achievement: 02/07/19 Potential to Achieve Goals: Fair    Frequency Min 3X/week    AM-PAC PT "6 Clicks" Mobility  Outcome Measure Help needed turning from your back to your side while in a flat bed without using bedrails?: None Help needed moving from lying on your back to sitting on the side of a flat bed without using bedrails?: None Help needed moving to and from a bed to a chair (including a wheelchair)?: A Little Help needed standing up from a chair using your arms (e.g., wheelchair or bedside chair)?: A Little Help needed to walk in hospital room?: A Lot Help needed climbing 3-5 steps with a railing? : A  Lot 6 Click Score: 18    End of Session Equipment Utilized During Treatment: Gait belt Activity Tolerance: Other (comment)(limited by dizziness) Patient left: in chair;with call bell/phone within reach Nurse Communication: Mobility status PT Visit Diagnosis: Unsteadiness on feet (R26.81);Other abnormalities of gait and mobility (R26.89);Muscle weakness (generalized) (M62.81);Difficulty in walking, not elsewhere classified (R26.2);Dizziness and giddiness (R42)    Time: 4327-6147 PT Time Calculation (min) (ACUTE ONLY): 18 min   Charges:   PT Evaluation $PT Eval Moderate Complexity: 1 Mod          Miasha Emmons B. Migdalia Dk PT, DPT Acute Rehabilitation Services Pager [phone] Office [phone]   [place] 01/24/2019, 4:14 PM

## 2019-01-24 NOTE — Progress Notes (Signed)
PROGRESS NOTE    Calvin [name]  TZG:[id number] DOB: 02/04/1961 DOA: 01/23/2019 PCP: [name], NP   Brief Narrative: Calvin [name] is a 58 y.o. male, with a hx of NICM, S-CHF w/ EF 35-40% 12/2017 Echo, uncontrolled DM, HTN, GERD, reported MI 2015 in [place], Afib 03/2018 on Coreg andEliquis. Patient presented secondary to chest pain. ACS workup was negative.    Assessment & Plan:   Principal Problem:   Chest pain Active Problems:   DM (diabetes mellitus) (HCC)   HLD (hyperlipidemia)   Chronic systolic heart failure (HCC)   Essential hypertension   Gastroesophageal reflux disease   Non-cardiac chest pain   AF (paroxysmal atrial fibrillation) (HCC)   Abdominal pain   Chest pain Atypical and reproducible. Troponin has been flat/downtrending, not consistent with ACS. Recent cath significant for non-obstructive CAD. Cardiology consulted with recommendations for no ischemic inpatient workup. 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 -Continue Eliquis  Hyperlipidemia -Continue Lipitor  CKD stage III Baseline.  Essential hypertension -Continue spironolactone and lisinopril  Dizziness Occurred when attempting to obtain orthostatic vitals. Concern for possible vestibular disease. Difficult for patient to ambulate. -PT vestibular consult   DVT prophylaxis: Eliquis Code Status:   Code Status: Full Code Family Communication: None Disposition Plan: Discharge home with home health likely in 24 hours if dizziness improved   Consultants:   Cardiology  Procedures:   None  Antimicrobials:  None    Subjective: Nausea with lower chest  pain.  Objective: Vitals:   01/23/19 2104 01/24/19 0507 01/24/19 0946 01/24/19 1239  BP: 122/68 (!) 143/67 126/64 (!) 108/58  Pulse: 89 88 85 84  Resp:   (!) 22 18  Temp: 97.7 F (36.5 C) 97.9 F (36.6 C) 98 F (36.7 C) 98 F (36.7 C)  TempSrc: Oral Oral Axillary Oral  SpO2: 100% 98% 100% 100%  Weight:  75.1 kg      Intake/Output Summary (Last 24 hours) at 01/24/2019 1334 Last data filed at 01/24/2019 1100 Gross per 24 hour  Intake 118 ml  Output -  Net 118 ml   Filed Weights   01/24/19 0507  Weight: 75.1 kg    Examination:  General exam: Appears calm and comfortable Respiratory system: Clear to auscultation. Respiratory effort normal. Cardiovascular system: S1 & S2 heard, RRR. No murmurs, rubs, gallops or clicks. Gastrointestinal system: Abdomen is nondistended, soft and nontender. No organomegaly or masses felt. Normal bowel sounds heard. Central nervous system: Alert and oriented. No focal neurological deficits. Musculoskeletal: No edema. No calf tenderness. Reproducible chest wall pain Skin: No cyanosis. No rashes Psychiatry: Judgement and insight appear normal. Mood & affect appropriate.     Data Reviewed: I have personally reviewed following labs and imaging studies  CBC: Recent Labs  Lab 01/23/19 1253  WBC 11.5*  HGB 16.0  HCT 46.8  MCV 90.9  PLT 759   Basic Metabolic Panel: Recent Labs  Lab 01/23/19 1253 01/24/19 1142  NA 135 138  K 3.7 3.7  CL 103 105  CO2 19* 23  GLUCOSE 424* 211*  BUN 14 14  CREATININE 1.45* 1.24  CALCIUM 8.7* 8.7*   GFR: Estimated Creatinine Clearance: 60.3 mL/min (by C-G formula based on SCr of 1.24 mg/dL). Liver Function Tests:  Recent Labs  Lab 01/23/19 1500  AST 21  ALT 20  ALKPHOS 63  BILITOT 0.5  PROT 6.4*  ALBUMIN 3.5   Recent Labs  Lab 01/23/19 1500  LIPASE 31   No results for input(s): AMMONIA in the last 168 hours. Coagulation Profile: No results for input(s): INR, PROTIME in the last 168  hours. Cardiac Enzymes: No results for input(s): CKTOTAL, CKMB, CKMBINDEX, TROPONINI in the last 168 hours. BNP (last 3 results) No results for input(s): PROBNP in the last 8760 hours. HbA1C: No results for input(s): HGBA1C in the last 72 hours. CBG: Recent Labs  Lab 01/23/19 2114 01/23/19 2324 01/23/19 2327 01/24/19 0802 01/24/19 1126  GLUCAP 261* 109* 113* 176* 219*   Lipid Profile: No results for input(s): CHOL, HDL, LDLCALC, TRIG, CHOLHDL, LDLDIRECT in the last 72 hours. Thyroid Function Tests: No results for input(s): TSH, T4TOTAL, FREET4, T3FREE, THYROIDAB in the last 72 hours. Anemia Panel: No results for input(s): VITAMINB12, FOLATE, FERRITIN, TIBC, IRON, RETICCTPCT in the last 72 hours. Sepsis Labs: No results for input(s): PROCALCITON, LATICACIDVEN in the last 168 hours.  Recent Results (from the past 240 hour(s))  SARS Coronavirus 2 (CEPHEID - Performed in [place] hospital lab), Hosp Order     Status: None   Collection Time: 01/23/19  7:25 PM   Specimen: Nasopharyngeal Swab  Result Value Ref Range Status   SARS Coronavirus 2 NEGATIVE NEGATIVE Final    Comment: (NOTE) If result is NEGATIVE SARS-CoV-2 target nucleic acids are NOT DETECTED. The SARS-CoV-2 RNA is generally detectable in upper and lower  respiratory specimens during the acute phase of infection. The lowest  concentration of SARS-CoV-2 viral copies this assay can detect is 250  copies / mL. A negative result does not preclude SARS-CoV-2 infection  and should not be used as the sole basis for treatment or other  patient management decisions.  A negative result may occur with  improper specimen collection / handling, submission of specimen other  than nasopharyngeal swab, presence of viral mutation(s) within the  areas targeted by this assay, and inadequate number of viral copies  (<250 copies / mL). A negative result must be combined with clinical  observations, patient history, and  epidemiological information. If result is POSITIVE SARS-CoV-2 target nucleic acids are DETECTED. The SARS-CoV-2 RNA is generally detectable in upper and lower  respiratory specimens dur ing the acute phase of infection.  Positive  results are indicative of active infection with SARS-CoV-2.  Clinical  correlation with patient history and other diagnostic information is  necessary to determine patient infection status.  Positive results do  not rule out bacterial infection or co-infection with other viruses. If result is PRESUMPTIVE POSTIVE SARS-CoV-2 nucleic acids MAY BE PRESENT.   A presumptive positive result was obtained on the submitted specimen  and confirmed on repeat testing.  While 2019 novel coronavirus  (SARS-CoV-2) nucleic acids may be present in the submitted sample  additional confirmatory testing may be necessary for epidemiological  and / or clinical management purposes  to differentiate between  SARS-CoV-2 and other Sarbecovirus currently known to infect humans.  If clinically indicated additional testing with an alternate test  methodology [phone]) is advised. The SARS-CoV-2 RNA is generally  detectable in upper and lower respiratory sp ecimens during the acute  phase of infection. The expected result is Negative. Fact Sheet for Patients:  StrictlyIdeas.no Fact Sheet for Healthcare Providers: BankingDealers.co.za This test is not yet approved or cleared by the [place] FDA and  has been authorized for detection and/or diagnosis of SARS-CoV-2 by FDA under an Emergency Use Authorization (EUA).  This EUA will remain in effect (meaning this test can be used) for the duration of the COVID-19 declaration under Section 564(b)(1) of the Act, 21 U.S.C. section 360bbb-3(b)(1), unless the authorization is terminated or revoked sooner. Performed at [facility], [place] [address] [place], [place] [address]           Radiology Studies: Dg Chest 2 View  Result Date: 01/23/2019 CLINICAL DATA:  Chest pain EXAM: CHEST - 2 VIEW COMPARISON:  July 16, 2018 FINDINGS: Stable cardiomegaly. The hila, mediastinum, lungs, and pleura are otherwise unremarkable. IMPRESSION: No active cardiopulmonary disease. Electronically Signed   By: Dorise Bullion III M.D   On: 01/23/2019 13:27   Ct Abdomen Pelvis W Contrast  Result Date: 01/23/2019 CLINICAL DATA:  Acute epigastric abdominal pain. EXAM: CT ABDOMEN AND PELVIS WITH CONTRAST TECHNIQUE: Multidetector CT imaging of the abdomen and pelvis was performed using the standard protocol following bolus administration of intravenous contrast. CONTRAST:  141m OMNIPAQUE IOHEXOL 300 MG/ML  SOLN COMPARISON:  CT scan dated 12/20/2011 FINDINGS: Lower chest: No acute abnormality. Hepatobiliary: No focal liver abnormality is seen. No gallstones, gallbladder wall thickening, or biliary dilatation. Pancreas: Unremarkable. No pancreatic ductal dilatation or surrounding inflammatory changes. Spleen: Normal in size without focal abnormality. Adrenals/Urinary Tract: Adrenal glands are unremarkable. Kidneys are normal, without renal calculi, focal lesion, or hydronephrosis. Bladder is unremarkable. Stomach/Bowel: Stomach is within normal limits. Appendix appears normal. No evidence of bowel wall thickening, distention, or inflammatory changes. Vascular/Lymphatic: Aortic atherosclerosis. No enlarged abdominal or pelvic lymph nodes. Reproductive: Prostate is unremarkable. Other: No abdominal wall hernia or abnormality. No abdominopelvic ascites. Musculoskeletal: No acute or significant osseous findings. IMPRESSION: Benign-appearing abdomen and pelvis. Aortic Atherosclerosis (ICD10-I70.0). Electronically Signed   By: JLorriane ShireM.D.   On: 01/23/2019 16:52        Scheduled Meds: . amitriptyline  20 mg Oral QHS  . apixaban  5 mg Oral BID  . atorvastatin  80 mg Oral Daily  . carvedilol  6.25 mg  Oral BID WC  . donepezil  10 mg Oral QHS  . insulin aspart  0-5 Units Subcutaneous QHS  . insulin aspart  0-9 Units Subcutaneous TID WC  . insulin glargine  70 Units Subcutaneous BID  . lisinopril  10 mg Oral QHS  . sodium chloride flush  3 mL Intravenous Once  . spironolactone  12.5 mg Oral Daily   Continuous Infusions:   LOS: 0 days     RCordelia Poche MD [facility] 01/24/2019, 1:34 PM  If 7PM-7AM, please contact night-coverage [email or web address]

## 2019-01-25 ENCOUNTER — Observation Stay (HOSPITAL_BASED_OUTPATIENT_CLINIC_OR_DEPARTMENT_OTHER): Payer: Medicare HMO

## 2019-01-25 DIAGNOSIS — K219 Gastro-esophageal reflux disease without esophagitis: Secondary | ICD-10-CM | POA: Diagnosis not present

## 2019-01-25 DIAGNOSIS — R1013 Epigastric pain: Secondary | ICD-10-CM | POA: Diagnosis not present

## 2019-01-25 DIAGNOSIS — I340 Nonrheumatic mitral (valve) insufficiency: Secondary | ICD-10-CM

## 2019-01-25 DIAGNOSIS — R0789 Other chest pain: Secondary | ICD-10-CM | POA: Diagnosis not present

## 2019-01-25 DIAGNOSIS — I480 Paroxysmal atrial fibrillation: Secondary | ICD-10-CM | POA: Diagnosis not present

## 2019-01-25 DIAGNOSIS — R072 Precordial pain: Secondary | ICD-10-CM | POA: Diagnosis not present

## 2019-01-25 DIAGNOSIS — I361 Nonrheumatic tricuspid (valve) insufficiency: Secondary | ICD-10-CM | POA: Diagnosis not present

## 2019-01-25 DIAGNOSIS — M79604 Pain in right leg: Secondary | ICD-10-CM | POA: Diagnosis not present

## 2019-01-25 DIAGNOSIS — I10 Essential (primary) hypertension: Secondary | ICD-10-CM | POA: Diagnosis not present

## 2019-01-25 DIAGNOSIS — I5022 Chronic systolic (congestive) heart failure: Secondary | ICD-10-CM | POA: Diagnosis not present

## 2019-01-25 DIAGNOSIS — R7989 Other specified abnormal findings of blood chemistry: Secondary | ICD-10-CM | POA: Diagnosis not present

## 2019-01-25 DIAGNOSIS — R079 Chest pain, unspecified: Secondary | ICD-10-CM | POA: Diagnosis not present

## 2019-01-25 DIAGNOSIS — E1140 Type 2 diabetes mellitus with diabetic neuropathy, unspecified: Secondary | ICD-10-CM | POA: Diagnosis not present

## 2019-01-25 DIAGNOSIS — Z794 Long term (current) use of insulin: Secondary | ICD-10-CM | POA: Diagnosis not present

## 2019-01-25 LAB — ECHOCARDIOGRAM COMPLETE: Weight: 2681.6 oz

## 2019-01-25 LAB — GLUCOSE, CAPILLARY
Glucose-Capillary: 136 mg/dL — ABNORMAL HIGH (ref 70–99)
Glucose-Capillary: 230 mg/dL — ABNORMAL HIGH (ref 70–99)
Glucose-Capillary: 105 mg/dL — ABNORMAL HIGH (ref 70–99)

## 2019-01-25 NOTE — Progress Notes (Signed)
PT Cancellation Note  Patient Details Name: Calvin [name] MRN: [id number] DOB: [birth year]   Cancelled Treatment:    Reason Eval/Treat Not Completed: Patient at procedure or test/unavailable; undergoing echo in the room.  Will attempt later as able.   Reginia Naas 01/25/2019, 12:16 PM  Magda Kiel, [place] [phone] 01/25/2019

## 2019-01-25 NOTE — Progress Notes (Signed)
Progress Note  Patient Name: Calvin [name] Date of Encounter: 01/25/2019  Primary Cardiologist: [name], MD    Subjective   Patient reports he is chest pain free. After the aplication of the Voltaren gel last night his pain completely went away.   Inpatient Medications    Scheduled Meds:  amitriptyline  20 mg Oral QHS   apixaban  5 mg Oral BID   atorvastatin  80 mg Oral Daily   carvedilol  6.25 mg Oral BID WC   diclofenac sodium  2 g Topical QID   donepezil  10 mg Oral QHS   insulin aspart  0-5 Units Subcutaneous QHS   insulin aspart  0-9 Units Subcutaneous TID WC   insulin glargine  70 Units Subcutaneous BID   lisinopril  10 mg Oral QHS   sodium chloride flush  3 mL Intravenous Once   spironolactone  12.5 mg Oral Daily   Continuous Infusions:  PRN Meds: acetaminophen, nitroGLYCERIN, ondansetron (ZOFRAN) IV, traMADol   Vital Signs    Vitals:   01/24/19 1939 01/24/19 2137 01/24/19 2349 01/25/19 0447  BP: 130/70 133/71 122/67 (!) 155/78  Pulse: 88  74 83  Resp:      Temp: 97.6 F (36.4 C)  98 F (36.7 C) 98 F (36.7 C)  TempSrc: Oral  Oral Oral  SpO2: 98%  98% 99%  Weight:    76 kg    Intake/Output Summary (Last 24 hours) at 01/25/2019 0816 Last data filed at 01/25/2019 0630 Gross per 24 hour  Intake 568 ml  Output 400 ml  Net 168 ml   Last 3 Weights 01/25/2019 01/24/2019 01/23/2019  Weight (lbs) 167 lb 9.6 oz 165 lb 9.6 oz 160 lb  Weight (kg) 76.023 kg 75.116 kg 72.576 kg      Telemetry    NSR, rates 70-80s, no arrhythmias noted- Personally Reviewed  ECG    No new - Personally Reviewed  Physical Exam   GEN: No acute distress.   Neck: No JVD Cardiac: RRR, no murmurs, rubs, or gallops.  Respiratory: Clear to auscultation bilaterally. GI: Soft, nontender, non-distended  MS: No edema; No deformity. Neuro:  Nonfocal  Psych: Normal affect   Labs    High Sensitivity Troponin:   Recent Labs  Lab 01/23/19 1253  01/23/19 1448 01/23/19 2115 01/23/19 2310  TROPONINIHS 28* 61* 46* 40*      Cardiac EnzymesNo results for input(s): TROPONINI in the last 168 hours. No results for input(s): TROPIPOC in the last 168 hours.   Chemistry Recent Labs  Lab 01/23/19 1253 01/23/19 1500 01/24/19 1142  NA 135  --  138  K 3.7  --  3.7  CL 103  --  105  CO2 19*  --  23  GLUCOSE 424*  --  211*  BUN 14  --  14  CREATININE 1.45*  --  1.24  CALCIUM 8.7*  --  8.7*  PROT  --  6.4*  --   ALBUMIN  --  3.5  --   AST  --  21  --   ALT  --  20  --   ALKPHOS  --  63  --   BILITOT  --  0.5  --   GFRNONAA 53*  --  >60  GFRAA >60  --  >60  ANIONGAP 13  --  10     Hematology Recent Labs  Lab 01/23/19 1253  WBC 11.5*  RBC 5.15  HGB 16.0  HCT 46.8  MCV  90.9  MCH 31.1  MCHC 34.2  RDW 11.9  PLT 255    BNPNo results for input(s): BNP, PROBNP in the last 168 hours.   DDimer No results for input(s): DDIMER in the last 168 hours.   Radiology    Dg Chest 2 View  Result Date: 01/23/2019 CLINICAL DATA:  Chest pain EXAM: CHEST - 2 VIEW COMPARISON:  July 16, 2018 FINDINGS: Stable cardiomegaly. The hila, mediastinum, lungs, and pleura are otherwise unremarkable. IMPRESSION: No active cardiopulmonary disease. Electronically Signed   By: Dorise Bullion III M.D   On: 01/23/2019 13:27   Ct Abdomen Pelvis W Contrast  Result Date: 01/23/2019 CLINICAL DATA:  Acute epigastric abdominal pain. EXAM: CT ABDOMEN AND PELVIS WITH CONTRAST TECHNIQUE: Multidetector CT imaging of the abdomen and pelvis was performed using the standard protocol following bolus administration of intravenous contrast. CONTRAST:  164m OMNIPAQUE IOHEXOL 300 MG/ML  SOLN COMPARISON:  CT scan dated 12/20/2011 FINDINGS: Lower chest: No acute abnormality. Hepatobiliary: No focal liver abnormality is seen. No gallstones, gallbladder wall thickening, or biliary dilatation. Pancreas: Unremarkable. No pancreatic ductal dilatation or surrounding  inflammatory changes. Spleen: Normal in size without focal abnormality. Adrenals/Urinary Tract: Adrenal glands are unremarkable. Kidneys are normal, without renal calculi, focal lesion, or hydronephrosis. Bladder is unremarkable. Stomach/Bowel: Stomach is within normal limits. Appendix appears normal. No evidence of bowel wall thickening, distention, or inflammatory changes. Vascular/Lymphatic: Aortic atherosclerosis. No enlarged abdominal or pelvic lymph nodes. Reproductive: Prostate is unremarkable. Other: No abdominal wall hernia or abnormality. No abdominopelvic ascites. Musculoskeletal: No acute or significant osseous findings. IMPRESSION: Benign-appearing abdomen and pelvis. Aortic Atherosclerosis (ICD10-I70.0). Electronically Signed   By: JLorriane ShireM.D.   On: 01/23/2019 16:52    Cardiac Studies   Echo Pending 01/25/2019   Echo 12/2017: Moderate global reduction in LV systolic function; moderatediastolic dysfunction. Left ventricle: The cavity size was normal. Wall thickness was normal. Systolic function was moderately reduced. The estimated ejection fraction was in the range of 35% to 40%. Diffuse hypokinesis. Features are consistent with a pseudonormal left ventricular filling pattern, with concomitant abnormal relaxation and increased filling pressure (grade 2 diastolic dysfunction). Doppler parameters are consistent with high ventricular filling pressure. - Mitral valve: Calcified annulus. Mildly thickened leaflets .   R/L Heart Cath 08/2017 --  Mild non-obstructive CAD. Moderate LV systolic dysfunction. Non-ischemic cardiomyopathy w/ Normal filling pressures.    Prox RCA to Mid RCA lesion is 30% stenosed.  Mid RCA to Dist RCA lesion is 30% stenosed.  Prox Cx to Dist Cx lesion is 20% stenosed.  Mid LAD lesion is 30% stenosed.  There is mild to moderate left ventricular systolic dysfunction.  LV end diastolic pressure is mildly elevated.  The left  ventricular ejection fraction is 35-45% by visual estimate.  There is no mitral valve regurgitation.  Holter Monitor 03/2018 Sinus rhythm with one episode of tachycardia which appears to be sinus tachycardia Rare Premature ventricular contractions (10 PVCs during monitoring period) Rare Premature atrial contractions (3 PACs during monitoring period) No evidence of atrial fibrillation  Patient Profile     58y.o. male of NICM, S-CHF w/ EF 35-40% 12/2017 Echo, uncontrolled DM, HTN, GERD, [place]2018 on Coreg andEliquis, intol Entrestowho is being evaluated for chest pain.   Assessment & Plan    1. Atypical Chest pain-clearly reproducible on exam. -Chest wall is tender to palpation.   -HS troponin 61>46>40, EGK with no new ischemic changes --not consistent with ACS. -Cath last  year showed nonobstructive CAD. Will not pursue further ischemic work-up -GI cocktail did not improve the pain -Voltaren gel completely resolved pain. Patient currently chest pain free -Echo pending.  2. NICM/Chronic Systolic Heart Failure --echo pending -12/2017 EF 35-40% with moderate diastolic dysfunction -09/7003 R/L HC showed mild nonobstructive CAD -Continue carvedilol, Spironolactone, Statin -Euvolemic on exam  3. Paroxysmal Afib -Reports he has not missed doses of Eliquis -Continue Eliquis and Coreg -Currently in NSR, rates 70-80s  4. HTN -Lisinopril 47m BID  -Stable  5. CKD stage 3  -Creatinine 1.45 >> 1.24 -Continue to monitor  6. Hyperlipidemia -Atorvastatin 815mBID -LDL 177 06/15/2018 -Goal <70  7. DM2 -per IM -Uncontrolled, A1C 11.1 11/2018   For questions or updates, please contact [facility] consult [email or web address] for contact info under        Signed, Verle Brillhart H Ninfa MeekerPA-C  01/25/2019, 8:16 AM

## 2019-01-25 NOTE — Progress Notes (Signed)
Pt resting well. States all previous symptoms have resolved. Cont to monitor. Carroll Kinds RN

## 2019-01-25 NOTE — Progress Notes (Signed)
Physical Therapy Treatment Patient Details Name: Calvin [name] MRN: [id number] DOB: 1961-03-18 Today's Date: 01/25/2019    History of Present Illness 58 y.o. 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  Initiated education on condition and on gaze stability exercises, but needs outpatient vestibular PT follow up.  States family can assist him to clinic.  Also will need to use RW for ambulation at home.  PT follow up if not d/c.     Follow Up Recommendations  Outpatient PT(for vestibular rehab)     Equipment Recommendations  Rolling walker with 5" wheels    Recommendations for Other Services       Precautions / Restrictions Precautions Precautions: Fall    Mobility  Bed Mobility   Bed Mobility: Supine to Sit;Sit to Supine     Supine to sit: Modified independent (Device/Increase time) Sit to supine: Modified independent (Device/Increase time)   General bed mobility comments: cues for keeping eyes on stationary target as coming up to sit to decrease symptoms  Transfers   Equipment used: Rolling walker (2 wheeled) Transfers: Sit to/from Stand Sit to Stand: Supervision         General transfer comment: cues for hand placement  Ambulation/Gait Ambulation/Gait assistance: Supervision Gait Distance (Feet): 150 Feet Assistive device: Rolling walker (2 wheeled) Gait Pattern/deviations: Step-through pattern;Decreased stride length     General Gait Details: cues for eyes on stationary target for compensation and for slow  turns   Chief Strategy Officer    Modified Rankin (Stroke Patients Only)       Balance Overall balance assessment: Needs assistance   Sitting balance-Leahy Scale: Good     Standing balance support: Bilateral upper extremity supported Standing balance-Leahy Scale: Poor Standing balance comment: reliant on UE support right now due to symptoms                            Cognition Arousal/Alertness: Awake/alert Behavior During Therapy: WFL for tasks assessed/performed Overall Cognitive Status: Within Functional Limits for tasks assessed                                        Exercises Other Exercises Other Exercises: demonstrated seated gaze stabilization with using thumb versus card with letter.  Discussed using walker and taking time with using visual compensation.    General Comments General comments (skin integrity, edema, etc.): Educated on symptoms and possible vestibular neuritis due to some decreased hearing R ear and positive for L gaze nystagmus.  Issued handout for seated gaze stabilization and referral if needed when goes to PCP.  Vestibular Assessment - 01/25/19 1600      Symptom Behavior   Subjective history of current problem  Reports symptoms started suddenly without provocation, fall or URI.  Began as sitting and suddenly breaking out in bumps that itched, then noted imbalance and lightheadedness.  Likes to walk,  but sometimes R leg gives away and he will trip, but denies head trauma.    Type of Dizziness   Imbalance;Lightheadedness;Spinning    Frequency of Dizziness  intermittent    Duration of Dizziness  minutes    Symptom Nature  Intermittent;Motion provoked    Aggravating Factors  Looking up to the ceiling;Activity in general;Turning head quickly    Relieving Factors  Closing eyes;Rest    Progression of Symptoms  No change since onset    History of similar episodes  No      Oculomotor Exam    Oculomotor Alignment  Normal    Ocular ROM  WNL    Spontaneous  Absent    Gaze-induced   Left beating nystagmus with L gaze    Smooth Pursuits  Intact    Saccades  Intact;Slow   due to symptomatic     Vestibulo-Ocular Reflex   VOR 1 Head Only (x 1 viewing)  moves head horizontal and vertical and able to keep eyes on target, but exacerbates symptoms     VOR to Slow Head Movement  Comment   difficult to perform test appropriately due to guarding   VOR Cancellation  Corrective saccades   coming to midline from R     Auditory   Comments  decreased to scratch test on R ear as compared to L      Positional Testing   Sidelying Test  Sidelying Right;Sidelying Left      Sidelying Right   Sidelying Right Duration  30 sec    Sidelying Right Symptoms  No nystagmus      Sidelying Left   Sidelying Left Duration  30 sec    Sidelying Left Symptoms  No nystagmus   but feels somewhat symptomatic         Pertinent Vitals/Pain Pain Assessment: Faces Faces Pain Scale: Hurts little more Pain Location: R knee with ambulation Pain Descriptors / Indicators: Aching Pain Intervention(s): Monitored during session;Repositioned    Home Living                      Prior Function            PT Goals (current goals can now be found in the care plan section) Progress towards PT goals: Progressing toward goals    Frequency    Min 3X/week      PT Plan Discharge plan needs to be updated    Co-evaluation              AM-PAC PT "6 Clicks" Mobility   Outcome Measure  Help needed turning from your back to your side while in a flat bed without using bedrails?: None Help needed moving from lying on your back to sitting on the side of a flat bed without using bedrails?: None Help needed moving to and from a bed to a chair (including a wheelchair)?: None Help needed standing up from a chair using your arms (e.g., wheelchair or bedside chair)?: None Help needed to walk in hospital  room?: A Little Help needed climbing 3-5 steps with a railing? : A Little 6 Click Score: 22    End of Session   Activity Tolerance: Patient tolerated treatment well Patient left: in bed;with call bell/phone within reach Nurse Communication: Other (comment)(d/c needs) PT Visit Diagnosis: Other abnormalities of gait and mobility (R26.89);Dizziness and giddiness (R42)     Time: 1455-1530 PT Time Calculation (min) (ACUTE ONLY): 35 min  Charges:  $Gait Training: 8-22  mins $Neuromuscular Re-education: 8-22 mins                     Magda Kiel, [place] [facility] [phone] 01/25/2019    Reginia Naas 01/25/2019, 4:16 PM

## 2019-01-25 NOTE — TOC Transition Note (Signed)
Transition of Care [facility]) - CM/SW Discharge Note Marvetta Gibbons RN,BSN Transitions of Care Cross Coverage 6E - RN Case Manager [phone]   Patient Details  Name: Calvin [name] MRN: [id number] Date of Birth: 03-10-1961  Transition of Care [facility]) CM/SW Contact:  Dawayne Patricia, RN Phone Number: 01/25/2019, 4:50 PM   Clinical Narrative:    Notified by 6E staff pt to d/c this evening needs RW- and referral for outpt vestibular rehab- referral sent to [facility] for Vestibular rehab needs- notified Zach with [place] for DME needs- RW to be delivered to room prior to discharge.    Final next level of care: OP Rehab Barriers to Discharge: No Barriers Identified   Patient Goals and CMS Choice        Discharge Placement  Home with outpt f/u                     Discharge Plan and Services                DME Arranged: Walker rolling DME Agency: [employer] Date DME Agency Contacted: 01/25/19 Time DME Agency Contacted: 1552 Representative spoke with at DME Agency: [place]: NA [place] Agency: NA        Social Determinants of Health ([place]) Interventions     Readmission Risk Interventions No flowsheet data found.

## 2019-01-25 NOTE — Discharge Summary (Signed)
Physician Discharge Summary  Calvin [name] BWL:[id number] DOB: 1960/09/13 DOA: 01/23/2019  PCP: [name], NP  Admit date: 01/23/2019 Discharge date: 01/25/2019  Admitted From: Home Disposition: Home  Recommendations for Outpatient Follow-up:  1. Follow up with PCP in 1 week 2. Please obtain BMP/CBC in one week 3. Please follow up on the following pending results: None  Home Health: Outpatient PT (vestibular rehab) Equipment/Devices: Rolling walker  Discharge Condition: Stable CODE STATUS: Full code Diet recommendation: Heart healthy   Brief/Interim Summary:  Admission HPI written by Albertine Patricia, MD    Calvin [name]  is a 58 y.o. male, with a hx of NICM, S-CHF w/ EF 35-40% 12/2017 Echo, uncontrolled DM, HTN, GERD, reported MI 2015 in [place], Afib 03/2018 on Coreg andEliquis, intol Entresto presenting today with complaints of chest pain , midsternal, across his chest, and in the radiation, not provoked by exertion, no relieving factors, intermittent, chronic, but has much worsened over last few hours, which prompted him to come to ED, denies any dyspnea, diarrhea, constipation, but he does report some nausea and vomiting, no syncope, presyncope, or cough, no fever or chills. -in ED and at 1.4, which is his baseline, troponin trended up from 28-61, EKG showing normal sinus rhythm, with some flattening of lateral ST waves, patient was seen by cardiologist, who recommended admission given his troponins trending up, but felt chest pain most likely musculoskeletal in origin, especially with cardiac cath last year significant for nonobstructive CAD.   Hospital course:  Chest pain Atypical and reproducible. Troponin has been flat/downtrending, not consistent with ACS. Recent cath significant for non-obstructive CAD. Transthoracic Echocardiogram unchanged. Cardiology consulted with recommendations for no ischemic inpatient workup. Improved with Voltaren gel.   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 Continue Spironolactone, lisinopril  Diabetes mellitus, type 2 Continue Insulin regimen of Lantus 70 units BID and SSI  Paroxysmal atrial fibrillation Rate controlled. -Continue Eliquis  Hyperlipidemia Continue Lipitor  CKD stage III Baseline.  Essential hypertension Continue spironolactone and lisinopril  Dizziness Occurred when attempting to obtain orthostatic vitals. Concern for possible vestibular disease. Difficult for patient to ambulate. Exam unremarkable and symptoms resolved. Vestibular PT evaluated and recommendations for outpatient PT.  Discharge Diagnoses:  Principal Problem:   Chest pain Active Problems:   DM (diabetes mellitus) (HCC)   HLD (hyperlipidemia)   Chronic systolic heart failure (HCC)   Essential hypertension   Gastroesophageal reflux disease   Non-cardiac chest pain   AF (paroxysmal atrial fibrillation) (HCC)   Abdominal pain    Discharge Instructions   Allergies as of 01/25/2019   No Known Allergies     Medication List    STOP taking these medications   Repatha SureClick 681 MG/ML Soaj Generic drug: Evolocumab     TAKE these medications   amitriptyline 10 MG tablet Commonly known as: ELAVIL Take 1 tablet at bed time for one week and then increase 2 tablets at bed time What changed:   how much to take  how to take this  when to take this  additional instructions   apixaban 5 MG Tabs tablet Commonly known as: ELIQUIS Take 5 mg by mouth 2 (two) times daily.   atorvastatin 80 MG tablet Commonly known as: LIPITOR Take 1 tablet (80 mg total) by mouth daily.   carvedilol 6.25 MG tablet Commonly known as: COREG Take 1 tablet (6.25 mg total) by mouth 2 (two) times daily with a  meal.   donepezil 10 MG tablet Commonly known as: ARICEPT Take 1 tablet (10 mg total) by mouth at bedtime.    INSULIN SYRINGE .3CC/29GX1/2" 29G X 1/2" 0.3 ML Misc Inject 1 Syringe 3 (three) times daily as directed. Check blood sugar three times daily. Dx: E11.9   lisinopril 10 MG tablet Commonly known as: ZESTRIL Take 1 tablet (10 mg total) by mouth at bedtime.   nitroGLYCERIN 0.4 MG SL tablet Commonly known as: NITROSTAT Place 1 tablet (0.4 mg total) under the tongue every 5 (five) minutes as needed for chest pain.   spironolactone 25 MG tablet Commonly known as: ALDACTONE Take 0.5 tablets (12.5 mg total) by mouth daily.   Toujeo Max SoloStar 300 UNIT/ML Sopn Generic drug: Insulin Glargine (2 Unit Dial) Inject 200 Units into the skin every morning. Dx Code: E11.9            Durable Medical Equipment  (From admission, onward)         Start     Ordered   01/25/19 1628  For home use only DME Walker rolling  Once    Question:  Patient needs a walker to treat with the following condition  Answer:  Dizziness   01/25/19 1627         Follow-up Information    [name], NP In 3 days.   Specialty: Geriatric Medicine Contact information: [address] [place] [address] [phone]          No Known Allergies  Consultations:  Cardiology   Procedures/Studies: Dg Chest 2 View  Result Date: 01/23/2019 CLINICAL DATA:  Chest pain EXAM: CHEST - 2 VIEW COMPARISON:  July 16, 2018 FINDINGS: Stable cardiomegaly. The hila, mediastinum, lungs, and pleura are otherwise unremarkable. IMPRESSION: No active cardiopulmonary disease. Electronically Signed   By: Dorise Bullion III M.D   On: 01/23/2019 13:27   Ct Abdomen Pelvis W Contrast  Result Date: 01/23/2019 CLINICAL DATA:  Acute epigastric abdominal pain. EXAM: CT ABDOMEN AND PELVIS WITH CONTRAST TECHNIQUE: Multidetector CT imaging of the abdomen and pelvis was performed using the standard protocol following bolus administration of intravenous contrast. CONTRAST:  181m OMNIPAQUE IOHEXOL 300 MG/ML  SOLN  COMPARISON:  CT scan dated 12/20/2011 FINDINGS: Lower chest: No acute abnormality. Hepatobiliary: No focal liver abnormality is seen. No gallstones, gallbladder wall thickening, or biliary dilatation. Pancreas: Unremarkable. No pancreatic ductal dilatation or surrounding inflammatory changes. Spleen: Normal in size without focal abnormality. Adrenals/Urinary Tract: Adrenal glands are unremarkable. Kidneys are normal, without renal calculi, focal lesion, or hydronephrosis. Bladder is unremarkable. Stomach/Bowel: Stomach is within normal limits. Appendix appears normal. No evidence of bowel wall thickening, distention, or inflammatory changes. Vascular/Lymphatic: Aortic atherosclerosis. No enlarged abdominal or pelvic lymph nodes. Reproductive: Prostate is unremarkable. Other: No abdominal wall hernia or abnormality. No abdominopelvic ascites. Musculoskeletal: No acute or significant osseous findings. IMPRESSION: Benign-appearing abdomen and pelvis. Aortic Atherosclerosis (ICD10-I70.0). Electronically Signed   By: JLorriane ShireM.D.   On: 01/23/2019 16:52     Transthoracic Echocardiogram (01/25/2019) IMPRESSIONS    1. The left ventricle has moderately reduced systolic function, with an ejection fraction of 35-40%. The cavity size was mildly dilated. There is mild concentric left ventricular hypertrophy. Left ventricular diastolic Doppler parameters are consistent  with pseudonormalization. Elevated left ventricular end-diastolic pressure.  2. The right ventricle has normal systolic function. The cavity was normal. There is no increase in right ventricular wall thickness.  3. Left atrial size was mildly dilated.  4. Mild  thickening of the aortic valve. Mild calcification of the aortic valve. Aortic valve regurgitation is trivial by color flow Doppler. No stenosis of the aortic valve.  5. The aorta is normal in size and structure.   Subjective: No issues overnight. Still with some mild abdominal pain.    Discharge Exam: Vitals:   01/25/19 1258 01/25/19 1329  BP: 131/73 132/71  Pulse:  79  Resp:    Temp:  98.1 F (36.7 C)  SpO2:  100%   Vitals:   01/24/19 2349 01/25/19 0447 01/25/19 1258 01/25/19 1329  BP: 122/67 (!) 155/78 131/73 132/71  Pulse: 74 83  79  Resp:      Temp: 98 F (36.7 C) 98 F (36.7 C)  98.1 F (36.7 C)  TempSrc: Oral Oral  Oral  SpO2: 98% 99%  100%  Weight:  76 kg      General: Pt is alert, awake, not in acute distress Cardiovascular: RRR, S1/S2 +, no rubs, no gallops Respiratory: CTA bilaterally, no wheezing, no rhonchi Abdominal: Soft, NT, ND, bowel sounds + Extremities: no edema, no cyanosis    The results of significant diagnostics from this hospitalization (including imaging, microbiology, ancillary and laboratory) are listed below for reference.     Microbiology: Recent Results (from the past 240 hour(s))  SARS Coronavirus 2 (CEPHEID - Performed in [place] hospital lab), Hosp Order     Status: None   Collection Time: 01/23/19  7:25 PM   Specimen: Nasopharyngeal Swab  Result Value Ref Range Status   SARS Coronavirus 2 NEGATIVE NEGATIVE Final    Comment: (NOTE) If result is NEGATIVE SARS-CoV-2 target nucleic acids are NOT DETECTED. The SARS-CoV-2 RNA is generally detectable in upper and lower  respiratory specimens during the acute phase of infection. The lowest  concentration of SARS-CoV-2 viral copies this assay can detect is 250  copies / mL. A negative result does not preclude SARS-CoV-2 infection  and should not be used as the sole basis for treatment or other  patient management decisions.  A negative result may occur with  improper specimen collection / handling, submission of specimen other  than nasopharyngeal swab, presence of viral mutation(s) within the  areas targeted by this assay, and inadequate number of viral copies  (<250 copies / mL). A negative result must be combined with clinical  observations, patient history,  and epidemiological information. If result is POSITIVE SARS-CoV-2 target nucleic acids are DETECTED. The SARS-CoV-2 RNA is generally detectable in upper and lower  respiratory specimens dur ing the acute phase of infection.  Positive  results are indicative of active infection with SARS-CoV-2.  Clinical  correlation with patient history and other diagnostic information is  necessary to determine patient infection status.  Positive results do  not rule out bacterial infection or co-infection with other viruses. If result is PRESUMPTIVE POSTIVE SARS-CoV-2 nucleic acids MAY BE PRESENT.   A presumptive positive result was obtained on the submitted specimen  and confirmed on repeat testing.  While 2019 novel coronavirus  (SARS-CoV-2) nucleic acids may be present in the submitted sample  additional confirmatory testing may be necessary for epidemiological  and / or clinical management purposes  to differentiate between  SARS-CoV-2 and other Sarbecovirus currently known to infect humans.  If clinically indicated additional testing with an alternate test  methodology [phone]) is advised. The SARS-CoV-2 RNA is generally  detectable in upper and lower respiratory sp ecimens during the acute  phase of infection. The expected result is Negative. Fact  Sheet for Patients:  StrictlyIdeas.no Fact Sheet for Healthcare Providers: BankingDealers.co.za This test is not yet approved or cleared by the [place] FDA and has been authorized for detection and/or diagnosis of SARS-CoV-2 by FDA under an Emergency Use Authorization (EUA).  This EUA will remain in effect (meaning this test can be used) for the duration of the COVID-19 declaration under Section 564(b)(1) of the Act, 21 U.S.C. section 360bbb-3(b)(1), unless the authorization is terminated or revoked sooner. Performed at [facility], [place] [address], [place], [place] [address]       Labs: BNP (last 3 results) Recent Labs    03/09/18 1849  BNP 250.0*   Basic Metabolic Panel: Recent Labs  Lab 01/23/19 1253 01/24/19 1142  NA 135 138  K 3.7 3.7  CL 103 105  CO2 19* 23  GLUCOSE 424* 211*  BUN 14 14  CREATININE 1.45* 1.24  CALCIUM 8.7* 8.7*   Liver Function Tests: Recent Labs  Lab 01/23/19 1500  AST 21  ALT 20  ALKPHOS 63  BILITOT 0.5  PROT 6.4*  ALBUMIN 3.5   Recent Labs  Lab 01/23/19 1500  LIPASE 31   No results for input(s): AMMONIA in the last 168 hours. CBC: Recent Labs  Lab 01/23/19 1253  WBC 11.5*  HGB 16.0  HCT 46.8  MCV 90.9  PLT 255   Cardiac Enzymes: No results for input(s): CKTOTAL, CKMB, CKMBINDEX, TROPONINI in the last 168 hours. BNP: Invalid input(s): POCBNP CBG: Recent Labs  Lab 01/24/19 1126 01/24/19 1558 01/24/19 2120 01/25/19 0739 01/25/19 1105  GLUCAP 219* 215* 111* 136* 230*   D-Dimer No results for input(s): DDIMER in the last 72 hours. Hgb A1c No results for input(s): HGBA1C in the last 72 hours. Lipid Profile No results for input(s): CHOL, HDL, LDLCALC, TRIG, CHOLHDL, LDLDIRECT in the last 72 hours. Thyroid function studies No results for input(s): TSH, T4TOTAL, T3FREE, THYROIDAB in the last 72 hours.  Invalid input(s): FREET3 Anemia work up No results for input(s): VITAMINB12, FOLATE, FERRITIN, TIBC, IRON, RETICCTPCT in the last 72 hours. Urinalysis    Component Value Date/Time   COLORURINE YELLOW 01/23/2019 1922   APPEARANCEUR CLEAR 01/23/2019 1922   LABSPEC 1.043 (H) 01/23/2019 1922   PHURINE 5.0 01/23/2019 1922   GLUCOSEU >=500 (A) 01/23/2019 1922   HGBUR NEGATIVE 01/23/2019 [place] NEGATIVE 01/23/2019 [place] NEGATIVE 01/23/2019 1922   PROTEINUR NEGATIVE 01/23/2019 1922   UROBILINOGEN 0.2 11/28/2012 1857   NITRITE NEGATIVE 01/23/2019 1922   LEUKOCYTESUR NEGATIVE 01/23/2019 1922   Sepsis Labs Invalid input(s): PROCALCITONIN,  WBC,  LACTICIDVEN Microbiology  Recent Results (from the past 240 hour(s))  SARS Coronavirus 2 (CEPHEID - Performed in [place] hospital lab), Hosp Order     Status: None   Collection Time: 01/23/19  7:25 PM   Specimen: Nasopharyngeal Swab  Result Value Ref Range Status   SARS Coronavirus 2 NEGATIVE NEGATIVE Final    Comment: (NOTE) If result is NEGATIVE SARS-CoV-2 target nucleic acids are NOT DETECTED. The SARS-CoV-2 RNA is generally detectable in upper and lower  respiratory specimens during the acute phase of infection. The lowest  concentration of SARS-CoV-2 viral copies this assay can detect is 250  copies / mL. A negative result does not preclude SARS-CoV-2 infection  and should not be used as the sole basis for treatment or other  patient management decisions.  A negative result may occur with  improper specimen collection / handling, submission of specimen other  than nasopharyngeal swab, presence of viral mutation(s) within the  areas targeted by this assay, and inadequate number of viral copies  (<250 copies / mL). A negative result must be combined with clinical  observations, patient history, and epidemiological information. If result is POSITIVE SARS-CoV-2 target nucleic acids are DETECTED. The SARS-CoV-2 RNA is generally detectable in upper and lower  respiratory specimens dur ing the acute phase of infection.  Positive  results are indicative of active infection with SARS-CoV-2.  Clinical  correlation with patient history and other diagnostic information is  necessary to determine patient infection status.  Positive results do  not rule out bacterial infection or co-infection with other viruses. If result is PRESUMPTIVE POSTIVE SARS-CoV-2 nucleic acids MAY BE PRESENT.   A presumptive positive result was obtained on the submitted specimen  and confirmed on repeat testing.  While 2019 novel coronavirus  (SARS-CoV-2) nucleic acids may be present in the submitted sample  additional confirmatory  testing may be necessary for epidemiological  and / or clinical management purposes  to differentiate between  SARS-CoV-2 and other Sarbecovirus currently known to infect humans.  If clinically indicated additional testing with an alternate test  methodology [phone]) is advised. The SARS-CoV-2 RNA is generally  detectable in upper and lower respiratory sp ecimens during the acute  phase of infection. The expected result is Negative. Fact Sheet for Patients:  StrictlyIdeas.no Fact Sheet for Healthcare Providers: BankingDealers.co.za This test is not yet approved or cleared by the [place] FDA and has been authorized for detection and/or diagnosis of SARS-CoV-2 by FDA under an Emergency Use Authorization (EUA).  This EUA will remain in effect (meaning this test can be used) for the duration of the COVID-19 declaration under Section 564(b)(1) of the Act, 21 U.S.C. section 360bbb-3(b)(1), unless the authorization is terminated or revoked sooner. Performed at [facility], [place] [address] [place], [place] [address]     SIGNED:   Cordelia Poche, MD [facility] 01/25/2019, 3:48 PM

## 2019-01-25 NOTE — Progress Notes (Signed)
Pt c/o headache, dizziness and pins and needle feeling in left arm. Vital signs stable. Dr. Teryl Lucy notified. Pt given ultram for pain. Cont to monitor. Carroll Kinds RN

## 2019-01-28 ENCOUNTER — Telehealth: Payer: Self-pay | Admitting: *Deleted

## 2019-01-28 NOTE — Telephone Encounter (Signed)
Transition Care Management Follow-up Telephone Call  Date of discharge and from where: 01/25/2019 [facility]   How have you been since you were released from the hospital? Patient still experiencing some Chest pains. Moved appointment up from 8/3 to 7/29 per patient request  Any questions or concerns? No   Items Reviewed:  Did the pt receive and understand the discharge instructions provided? Yes   Medications obtained and verified? Yes   Any new allergies since your discharge? No   Dietary orders reviewed? Yes  Do you have support at home? Yes   Other (ie: DME, Home Health, etc) Home Health is coming in  Functional Questionnaire: (I = Independent and D = Dependent) ADL's: I  Bathing/Dressing- I   Meal Prep- I  Eating- I  Maintaining continence- I  Transferring/Ambulation- I  Managing Meds- I   Follow up appointments reviewed:    PCP Hospital f/u appt confirmed? Yes  Scheduled to see Janett Billow on 01/30/19   Specialist Hospital f/u appt confirmed? No    Are transportation arrangements needed? No   If their condition worsens, is the pt aware to call  their PCP or go to the ED? Yes  Was the patient provided with contact information for the PCP's office or ED? Yes  Was the pt encouraged to call back with questions or concerns? Yes

## 2019-01-30 ENCOUNTER — Encounter: Payer: Self-pay | Admitting: Nurse Practitioner

## 2019-01-30 ENCOUNTER — Encounter: Payer: Medicare HMO | Admitting: Nurse Practitioner

## 2019-01-30 ENCOUNTER — Other Ambulatory Visit (HOSPITAL_COMMUNITY): Payer: Self-pay

## 2019-01-30 ENCOUNTER — Other Ambulatory Visit: Payer: Self-pay

## 2019-01-30 ENCOUNTER — Ambulatory Visit (INDEPENDENT_AMBULATORY_CARE_PROVIDER_SITE_OTHER): Payer: Medicare HMO | Admitting: Nurse Practitioner

## 2019-01-30 VITALS — BP 132/70 | HR 90 | Temp 98.1°F | Ht 64.0 in | Wt 165.0 lb

## 2019-01-30 DIAGNOSIS — I10 Essential (primary) hypertension: Secondary | ICD-10-CM | POA: Diagnosis not present

## 2019-01-30 DIAGNOSIS — E785 Hyperlipidemia, unspecified: Secondary | ICD-10-CM | POA: Diagnosis not present

## 2019-01-30 DIAGNOSIS — R0602 Shortness of breath: Secondary | ICD-10-CM | POA: Diagnosis not present

## 2019-01-30 DIAGNOSIS — R2681 Unsteadiness on feet: Secondary | ICD-10-CM | POA: Diagnosis not present

## 2019-01-30 DIAGNOSIS — Z794 Long term (current) use of insulin: Secondary | ICD-10-CM

## 2019-01-30 DIAGNOSIS — R079 Chest pain, unspecified: Secondary | ICD-10-CM | POA: Diagnosis not present

## 2019-01-30 DIAGNOSIS — E1140 Type 2 diabetes mellitus with diabetic neuropathy, unspecified: Secondary | ICD-10-CM | POA: Diagnosis not present

## 2019-01-30 DIAGNOSIS — G3184 Mild cognitive impairment, so stated: Secondary | ICD-10-CM

## 2019-01-30 NOTE — Progress Notes (Signed)
Paramedicine Encounter    Patient ID: Calvin [name], male    DOB: [birth year], 58 y.o.   MRN: [id number]   [name], NP as PCP - General (Geriatric Medicine) [name], MD as PCP - Cardiology (Cardiology) [name], [name], MD as PCP - Advanced Heart Failure (Cardiology) [name], MD as Consulting Physician (Endocrinology)  Patient Active Problem List   Diagnosis Date Noted  . Abdominal pain 01/23/2019  . Chest pain 01/23/2019  . Right leg pain 12/20/2018  . Overgrown toenails 12/20/2018  . Unsteady gait 12/20/2018  . Encounter for therapeutic drug monitoring 10/22/2018  . Nausea and vomiting 03/10/2018  . AF (paroxysmal atrial fibrillation) ([place]) 03/10/2018  . Non-cardiac chest pain 03/09/2018  . Diabetic neuropathy ([place]) 09/26/2017  . MCI (mild cognitive impairment) 06/23/2017  . OSA on CPAP 05/10/2017  . Gastroesophageal reflux disease 05/10/2017  . Insomnia 05/10/2017  . Onychomycosis of multiple toenails with type 2 diabetes mellitus and peripheral neuropathy ([place]) 05/10/2017  . Chronic systolic heart failure ([place]) 11/22/2015  . Essential hypertension 11/22/2015  . DM (diabetes mellitus) ([place]) 11/20/2015  . HLD (hyperlipidemia) 11/20/2015  . Hx of adenomatous colonic polyps 05/19/2011    Current Outpatient Medications:  .  amitriptyline (ELAVIL) 10 MG tablet, Take 1 tablet at bed time for one week and then increase 2 tablets at bed time, Disp: 60 tablet, Rfl: 3 .  apixaban (ELIQUIS) 5 MG TABS tablet, Take 5 mg by mouth 2 (two) times daily., Disp: , Rfl:  .  atorvastatin (LIPITOR) 80 MG tablet, Take 1 tablet (80 mg total) by mouth daily., Disp: 90 tablet, Rfl: 3 .  carvedilol (COREG) 6.25 MG tablet, Take 1 tablet (6.25 mg total) by mouth 2 (two) times daily with a meal., Disp: 180 tablet, Rfl: 3 .  donepezil (ARICEPT) 10 MG tablet, Take 1 tablet (10 mg total) by mouth at bedtime., Disp: 90 tablet, Rfl: 3 .  Evolocumab  (REPATHA) 140 MG/ML SOSY, Inject into the skin every 14 (fourteen) days., Disp: , Rfl:  .  Insulin Glargine, 2 Unit Dial, (TOUJEO MAX SOLOSTAR) 300 UNIT/ML SOPN, Inject 200 Units into the skin every morning. Dx Code: E11.9, Disp: 10 pen, Rfl: 2 .  Insulin Syringe-Needle U-100 (INSULIN SYRINGE .3CC/29GX1/2") 29G X 1/2" 0.3 ML MISC, Inject 1 Syringe 3 (three) times daily as directed. Check blood sugar three times daily. Dx: E11.9, Disp: 100 each, Rfl: 3 .  lisinopril (PRINIVIL,ZESTRIL) 10 MG tablet, Take 1 tablet (10 mg total) by mouth at bedtime., Disp: 90 tablet, Rfl: 3 .  spironolactone (ALDACTONE) 25 MG tablet, Take 0.5 tablets (12.5 mg total) by mouth daily., Disp: 45 tablet, Rfl: 3 .  nitroGLYCERIN (NITROSTAT) 0.4 MG SL tablet, Place 1 tablet (0.4 mg total) under the tongue every 5 (five) minutes as needed for chest pain. (Patient not taking: Reported on 01/30/2019), Disp: 30 tablet, Rfl: 1 No Known Allergies    Social History   Socioeconomic History  . Marital status: Single    Spouse name: Not on file  . Number of children: 0  . Years of education: Not on file  . Highest education level: Not on file  Occupational History  . Occupation: Disability  Social Needs  . Financial resource strain: Not on file  . Food insecurity    Worry: Not on file    Inability: Not on file  . Transportation needs    Medical: Not on file    Non-medical: Not on file  Tobacco  Use  . Smoking status: Never Smoker  . Smokeless tobacco: Never Used  Substance and Sexual Activity  . Alcohol use: No  . Drug use: No  . Sexual activity: Yes    Birth control/protection: None  Lifestyle  . Physical activity    Days per week: Not on file    Minutes per session: Not on file  . Stress: Not on file  Relationships  . Social Herbalist on phone: Not on file    Gets together: Not on file    Attends religious service: Not on file    Active member of club or organization: Not on file    Attends  meetings of clubs or organizations: Not on file    Relationship status: Not on file  . Intimate partner violence    Fear of current or ex partner: Not on file    Emotionally abused: Not on file    Physically abused: Not on file    Forced sexual activity: Not on file  Other Topics Concern  . Not on file  Social History Narrative   Social History   Diet?    Do you drink/eat things with caffeine? yes   Marital status?       single      Do you live in a house, apartment, assisted living, condo, trailer, etc.? yes   Is it one or more stories? One story   How many persons live in your home?   Do you have any pets in your home? (please list)    Highest level of education completed? graduate   Do you exercise?            no                          Type & how often?   Advanced Directives   Do you have a living will? no   Do you have a DNR form?                                  If not, do you want to discuss one? no   Do you have signed POA/HPOA for forms? no      Functional Status   Do you have difficulty bathing or dressing yourself? no   Do you have difficulty preparing food or eating? no   Do you have difficulty managing your medications? no   Do you have difficulty managing your finances? no   Do you have difficulty affording your medications? no    Physical Exam      Future Appointments  Date Time Provider [place]  02/13/2019  8:00 AM Mervyn Gay, PT [facility]  04/22/2019  3:00 PM Marzetta Board, DPM [facility]  05/01/2019 11:00 AM [name], NP [facility] None    BP 122/82   Pulse 100   Resp 16   Wt 165 lb (74.8 kg)   SpO2 99%   BMI 28.32 kg/m   CBG PTA-415  Weight yesterday-?  Last visit weight-160  Pt recently home from [place]. When I left last week he got a lot worse and ended up begin admitted. hosp said that his allergic reaction set off more issues for him causing more symptoms.  He is being sent home with PT,   He still havent got his insulin straightened out for the dosing.  Advised him to get appoint with his endocrinologist. I also sent message to endo office asking them to reach out to him as well as he has not had good luck with reaching them. meds verified and pill box refilled. No missed doses.    Marylouise Stacks, [facility] Paramedic  01/30/19

## 2019-01-30 NOTE — Progress Notes (Signed)
Careteam: [name], NP as PCP - General (Geriatric Medicine) [name], MD as PCP - Cardiology (Cardiology) [name], [name], MD as PCP - Advanced Heart Failure (Cardiology) [name], MD as Consulting Physician (Endocrinology)  Advanced Directive information    No Known Allergies  Chief Complaint  Patient presents with  . [facility] admission follow-up 01/23/2019-01/25/2019 for chest pain, ongoing and blance issues   . Immunizations    Discuss need for pneumococcal   . Best Practice Recommendations    Discusss need for Hep C screening      HPI: Patient is a 58 y.o. male seen in the office today for hospital follow up. Pt with hx of hx of NICM, S-CHF w/ EF 35-40% 12/2017 Echo, uncontrolled DM, HTN, GERD, reported MI 2015 in [place], Afib 03/2018 on Coreg andEliquis, into Entrestopresenting today with complaints of chest pain ,midsternal, across his chest, and in the radiation, not provoked by exertion, no relieving factors, intermittent, chronic, but had worsened   EKG showing normal sinus rhythm, with some flattening of lateral ST waves, patient was seen by cardiologist, who recommended admission given his troponins trending up, but felt chest pain most likely musculoskeletal in origin, especially with cardiac cath last year significant for nonobstructive CAD. Chest pain was felt to be atypical and reproducible and not consistent with ACS.cardiology recommendations for no ischemic inpatient workup as pain Improved with Voltaren gel.  Reports he still has some chest pain off and on but not as bad as it was.  Reports he has ongoing shortness of breath which started prior to hospitalization.  Chest xray during hospitalization without active disease. Reports he gets short of breath just when sitting and with mild activity.   Pt was started on rapatha during hospitalization, but does not know how to take it or why to  take it. Reports he got a grant but still had to pay 125$ has enough for 3 months.  Has not taken yet. Was sent to him in the mail by cardiologist  Continues to be lightheaded.   Following with endocrinologist due to uncontrolled diabetes- blood sugars continue to be 300-400. Taking toujeo 200 units in the morning and 140 units in the evening.    Review of Systems:  Review of Systems  Constitutional: Positive for malaise/fatigue. Negative for chills, fever and weight loss.  HENT: Negative for congestion, sore throat and tinnitus.   Respiratory: Negative for cough, sputum production and shortness of breath.   Cardiovascular: Positive for chest pain (off and on). Negative for palpitations and leg swelling.  Gastrointestinal: Negative for abdominal pain, constipation, diarrhea and heartburn.  Genitourinary: Negative for dysuria, frequency and urgency.  Musculoskeletal: Negative for back pain, falls, joint pain and myalgias.  Skin: Negative.   Neurological: Negative for dizziness and headaches.  Psychiatric/Behavioral: Positive for memory loss. Negative for depression. The patient does not have insomnia.     Past Medical History:  Diagnosis Date  . Adenoidal hypertrophy   . Adenomatous colon polyps 2012  . Arthritis   . Diabetes mellitus   . GERD (gastroesophageal reflux disease)   . Heart attack ([place])    [place] living in [place]  [name] Hx of adenomatous colonic polyps 08/18/2017  . Hyperlipidemia   . Hypertension   . Mild CAD    a. cath in 08/2017 showing mild nonobstructive CAD with scattered 20-30% stenosis.   . Nonischemic cardiomyopathy ([place])    a. EF  20-25% by echo in 09/2017 with cath showing mild CAD. b.  Last echo 12/2017 EF 35-40%, grade 2 DD.  [name] Obesity   . PAF (paroxysmal atrial fibrillation) ([place])   . Sleep apnea    cpap, pt says no longer has   Past Surgical History:  Procedure Laterality Date  . COLONOSCOPY  05/13/11   9 adenomas  . FOREARM SURGERY    . MUSCLE BIOPSY     . RIGHT/LEFT HEART CATH AND CORONARY ANGIOGRAPHY N/A 08/25/2017   Procedure: RIGHT/LEFT HEART CATH AND CORONARY ANGIOGRAPHY;  Surgeon: [name], MD;  Location: [facility];  Service: Cardiovascular;  Laterality: N/A;   Social History:   reports that he has never smoked. He has never used smokeless tobacco. He reports that he does not drink alcohol or use drugs.  Family History  Problem Relation Age of Onset  . Heart disease Mother   . Heart attack Mother 37  . Hypertension Mother   . Hyperlipidemia Mother   . Diabetes Mother   . Heart disease Father   . Heart attack Father 18  . Hypertension Father   . Hyperlipidemia Father   . Diabetes Father   . Heart attack Sister 50  . Colon cancer Neg Hx   . Stomach cancer Neg Hx   . Esophageal cancer Neg Hx   . Rectal cancer Neg Hx     Medications: Patient's Medications  New Prescriptions   No medications on file  Previous Medications   AMITRIPTYLINE (ELAVIL) 10 MG TABLET    Take 1 tablet at bed time for one week and then increase 2 tablets at bed time   APIXABAN (ELIQUIS) 5 MG TABS TABLET    Take 5 mg by mouth 2 (two) times daily.   ATORVASTATIN (LIPITOR) 80 MG TABLET    Take 1 tablet (80 mg total) by mouth daily.   CARVEDILOL (COREG) 6.25 MG TABLET    Take 1 tablet (6.25 mg total) by mouth 2 (two) times daily with a meal.   DONEPEZIL (ARICEPT) 10 MG TABLET    Take 1 tablet (10 mg total) by mouth at bedtime.   EVOLOCUMAB (REPATHA) 140 MG/ML SOSY    Inject into the skin every 14 (fourteen) days.   INSULIN GLARGINE, 2 UNIT DIAL, (TOUJEO MAX SOLOSTAR) 300 UNIT/ML SOPN    Inject 200 Units into the skin every morning. Dx Code: E11.9   INSULIN SYRINGE-NEEDLE U-100 (INSULIN SYRINGE .3CC/29GX1/2") 29G X 1/2" 0.3 ML MISC    Inject 1 Syringe 3 (three) times daily as directed. Check blood sugar three times daily. Dx: E11.9   LISINOPRIL (PRINIVIL,ZESTRIL) 10 MG TABLET    Take 1 tablet (10 mg total) by mouth at bedtime.    NITROGLYCERIN (NITROSTAT) 0.4 MG SL TABLET    Place 1 tablet (0.4 mg total) under the tongue every 5 (five) minutes as needed for chest pain.   SPIRONOLACTONE (ALDACTONE) 25 MG TABLET    Take 0.5 tablets (12.5 mg total) by mouth daily.  Modified Medications   No medications on file  Discontinued Medications   No medications on file    Physical Exam:  Vitals:   01/30/19 1057  BP: 132/70  Pulse: 90  Temp: 98.1 F (36.7 C)  TempSrc: Oral  SpO2: 98%  Weight: 165 lb (74.8 kg)  Height: _0  (1.626 m)   Body mass index is 28.32 kg/m. Wt Readings from Last 3 Encounters:  01/30/19 165 lb (74.8 kg)  01/25/19 167 lb 9.6  oz (76 kg)  01/23/19 160 lb (72.6 kg)    Physical Exam Constitutional:      Appearance: Normal appearance. He is well-developed.  HENT:     Right Ear: Tympanic membrane, ear canal and external ear normal.     Left Ear: Tympanic membrane, ear canal and external ear normal.     Nose: Congestion present.     Mouth/Throat:     Mouth: Mucous membranes are moist.  Eyes:     General: No scleral icterus.    Pupils: Pupils are equal, round, and reactive to light.  Neck:     Musculoskeletal: Neck supple.     Thyroid: No thyromegaly.     Vascular: No carotid bruit.  Cardiovascular:     Rate and Rhythm: Normal rate and regular rhythm.     Heart sounds: Murmur (1/6 sem) present. No friction rub. No gallop.   Pulmonary:     Effort: Pulmonary effort is normal.     Breath sounds: Normal breath sounds. No wheezing or rales.  Chest:     Chest wall: No tenderness.  Abdominal:     General: Bowel sounds are normal. There is no distension or abdominal bruit.     Palpations: Abdomen is soft. Abdomen is not rigid. There is no hepatomegaly, mass or pulsatile mass.     Tenderness: There is no abdominal tenderness. There is no guarding or rebound.     Hernia: No hernia is present.  Musculoskeletal:     Right lower leg: No edema.  Lymphadenopathy:     Cervical: No cervical  adenopathy.  Skin:    General: Skin is warm and dry.     Findings: No rash.  Neurological:     Mental Status: He is alert.  Psychiatric:        Behavior: Behavior normal.     Labs reviewed: Basic Metabolic Panel: Recent Labs    03/09/18 1849  09/10/18 1015 01/23/19 1253 01/24/19 1142  NA  --    < > 135 135 138  K  --    < > 4.2 3.7 3.7  CL  --    < > 103 103 105  CO2  --    < > 26 19* 23  GLUCOSE  --    < > 260* 424* 211*  BUN  --    < > _0 CREATININE 1.14   < > 1.29* 1.45* 1.24  CALCIUM  --    < > 9.2 8.7* 8.7*  MG 1.9  --   --   --   --   PHOS 3.5  --   --   --   --    < > = values in this interval not displayed.   Liver Function Tests: Recent Labs    03/09/18 0949 06/15/18 1033 01/23/19 1500  AST _1 ALT _2 ALKPHOS 72 82 63  BILITOT 1.0 0.3 0.5  PROT 6.4* 6.3 6.4*  ALBUMIN 3.7 4.2 3.5   Recent Labs    03/09/18 0949 01/23/19 1500  LIPASE 33 31   No results for input(s): AMMONIA in the last 8760 hours. CBC: Recent Labs    03/10/18 0345 07/16/18 1928 01/23/19 1253  WBC 7.5 6.7 11.5*  HGB 14.6 15.4 16.0  HCT 42.9 45.6 46.8  MCV 89.6 89.9 90.9  PLT 212 222 255   Lipid Panel: Recent Labs    03/09/18 1500 06/15/18 1033  CHOL 222* 244*  HDL  58 53  LDLCALC 154* 177*  TRIG 52 72  CHOLHDL 3.8 4.6   TSH: No results for input(s): TSH in the last 8760 hours. A1C: Lab Results  Component Value Date   HGBA1C 11.1 (A) 11/15/2018   Dg Chest 2 View  Result Date: 01/23/2019 CLINICAL DATA:  Chest pain EXAM: CHEST - 2 VIEW COMPARISON:  July 16, 2018 FINDINGS: Stable cardiomegaly. The hila, mediastinum, lungs, and pleura are otherwise unremarkable. IMPRESSION: No active cardiopulmonary disease. Electronically Signed   By: Dorise Bullion III M.D   On: 01/23/2019 13:27   Ct Abdomen Pelvis W Contrast  Result Date: 01/23/2019 CLINICAL DATA:  Acute epigastric abdominal pain. EXAM: CT ABDOMEN AND PELVIS WITH CONTRAST TECHNIQUE:  Multidetector CT imaging of the abdomen and pelvis was performed using the standard protocol following bolus administration of intravenous contrast. CONTRAST:  166m OMNIPAQUE IOHEXOL 300 MG/ML  SOLN COMPARISON:  CT scan dated 12/20/2011 FINDINGS: Lower chest: No acute abnormality. Hepatobiliary: No focal liver abnormality is seen. No gallstones, gallbladder wall thickening, or biliary dilatation. Pancreas: Unremarkable. No pancreatic ductal dilatation or surrounding inflammatory changes. Spleen: Normal in size without focal abnormality. Adrenals/Urinary Tract: Adrenal glands are unremarkable. Kidneys are normal, without renal calculi, focal lesion, or hydronephrosis. Bladder is unremarkable. Stomach/Bowel: Stomach is within normal limits. Appendix appears normal. No evidence of bowel wall thickening, distention, or inflammatory changes. Vascular/Lymphatic: Aortic atherosclerosis. No enlarged abdominal or pelvic lymph nodes. Reproductive: Prostate is unremarkable. Other: No abdominal wall hernia or abnormality. No abdominopelvic ascites. Musculoskeletal: No acute or significant osseous findings. IMPRESSION: Benign-appearing abdomen and pelvis. Aortic Atherosclerosis (ICD10-I70.0). Electronically Signed   By: JLorriane ShireM.D.   On: 01/23/2019 16:52     Assessment/Plan 1. Shortness of breath Reports shortness of breath has been progressively worse over the last year but worse in the last 1 month. No cough or congestion. Chest xray without acute findings. Unable to go up and down his stairs without being short of breath and trips to the bathroom have been more taxing. O2 sats stable.  - Ambulatory referral to Pulmonology for further evaluation - BMP with eGFR([facility]) - CBC with Differential/Platelet  2. Chest pain, unspecified type Has improved, Felt to be musculoskeletal and reproducible on exam today. Continues on Voltaren   3. Unsteady gait -rehab referral placed.   4. MCI (mild cognitive  impairment) -ongoing, continues on aricept 10 mg daily. Needs frequent reminders.   5. Type 2 diabetes mellitus with diabetic neuropathy, with long-term current use of insulin (HCC) Uncontrolled, discussed diet modifications and medication compliance. Needs follow up with endocrinology as this is overdue.   6. Essential hypertension -blood pressure stable. Continues on coreg, spironolactone and lisinopril.   7. Hyperlipidemia, unspecified hyperlipidemia type Starting raptha per cardiology, bought into office today and receive first dose in office with instructions. Continues on lipitor 80 mg daily.   Next appt: 3 months.  JCarlos American EB[facility] [phone]

## 2019-01-30 NOTE — Patient Instructions (Signed)
Call Dr Rosario Adie office and set up follow up appt- [phone]

## 2019-01-31 ENCOUNTER — Telehealth: Payer: Self-pay | Admitting: Endocrinology

## 2019-01-31 LAB — CBC WITH DIFFERENTIAL/PLATELET
Absolute Monocytes: 370 cells/uL (ref 200–950)
Basophils Absolute: 22 cells/uL (ref 0–200)
Basophils Relative: 0.4 %
Eosinophils Absolute: 90 cells/uL (ref 15–500)
Eosinophils Relative: 1.6 %
HCT: 44.9 % (ref 38.5–50.0)
Hemoglobin: 15 g/dL (ref 13.2–17.1)
Lymphs Abs: 1982 cells/uL (ref 850–3900)
MCH: 30.6 pg (ref 27.0–33.0)
MCHC: 33.4 g/dL (ref 32.0–36.0)
MCV: 91.6 fL (ref 80.0–100.0)
MPV: 11.5 fL (ref 7.5–12.5)
Monocytes Relative: 6.6 %
Neutro Abs: 3136 cells/uL (ref 1500–7800)
Neutrophils Relative %: 56 %
Platelets: 236 10*3/uL (ref 140–400)
RBC: 4.9 10*6/uL (ref 4.20–5.80)
RDW: 12.3 % (ref 11.0–15.0)
Total Lymphocyte: 35.4 %
WBC: 5.6 10*3/uL (ref 3.8–10.8)

## 2019-01-31 LAB — BASIC METABOLIC PANEL WITH GFR
BUN: 16 mg/dL (ref 7–25)
CO2: 31 mmol/L (ref 20–32)
Calcium: 9.9 mg/dL (ref 8.6–10.3)
Chloride: 102 mmol/L (ref 98–110)
Creat: 1.32 mg/dL (ref 0.70–1.33)
GFR, Est African American: 68 mL/min/{1.73_m2} (ref >=60)
GFR, Est Non African American: 59 mL/min/{1.73_m2} — ABNORMAL LOW (ref >=60)
Glucose, Bld: 239 mg/dL — ABNORMAL HIGH (ref 65–99)
Potassium: 4.8 mmol/L (ref 3.5–5.3)
Sodium: 138 mmol/L (ref 135–146)

## 2019-01-31 NOTE — Telephone Encounter (Signed)
Completed. Patients appointment is on 02/01/19 @ 10 a.m.

## 2019-01-31 NOTE — Telephone Encounter (Signed)
-----   Message from Aleatha Borer, LPN sent at 7/61/4709  6:53 AM EDT ----- Please call pt to schedule an appt. See message below ----- Message ----- From: Marylouise Stacks, EMT Sent: 01/30/2019   5:18 PM EDT To: Aleatha Borer, LPN  Good afternoon,   Can you call patient for a visit? He has been trying to reach your office but unable to get through. Or let me know when it is and I can relay it to him. He needs to be seen to get his insulin straightened out. He was d/c from the hosp a few days ago and they were able to get it under control and since he has been home its been very high. Today it was 425.   Marylouise Stacks, EMT-Paramedic  01/30/19

## 2019-02-01 ENCOUNTER — Ambulatory Visit (INDEPENDENT_AMBULATORY_CARE_PROVIDER_SITE_OTHER): Payer: Medicare HMO | Admitting: Endocrinology

## 2019-02-01 ENCOUNTER — Encounter: Payer: Self-pay | Admitting: Endocrinology

## 2019-02-01 ENCOUNTER — Other Ambulatory Visit: Payer: Self-pay

## 2019-02-01 VITALS — BP 98/52 | HR 85 | Ht 64.0 in | Wt 166.2 lb

## 2019-02-01 DIAGNOSIS — M79672 Pain in left foot: Secondary | ICD-10-CM

## 2019-02-01 DIAGNOSIS — Z794 Long term (current) use of insulin: Secondary | ICD-10-CM

## 2019-02-01 DIAGNOSIS — M79671 Pain in right foot: Secondary | ICD-10-CM

## 2019-02-01 DIAGNOSIS — E1140 Type 2 diabetes mellitus with diabetic neuropathy, unspecified: Secondary | ICD-10-CM

## 2019-02-01 HISTORY — DX: Pain in left foot: M79.672

## 2019-02-01 HISTORY — DX: Pain in right foot: M79.671

## 2019-02-01 LAB — POCT GLYCOSYLATED HEMOGLOBIN (HGB A1C): Hemoglobin A1C: 10.6 % — AB (ref 4.0–5.6)

## 2019-02-01 MED ORDER — TOUJEO MAX SOLOSTAR 300 UNIT/ML ~~LOC~~ SOPN: 220.0000 [IU] | PEN_INJECTOR | SUBCUTANEOUS | 11 refills | Status: DC

## 2019-02-01 MED ORDER — CONTOUR TEST VI STRP: 1.0000 | ORAL_STRIP | Freq: Two times a day (BID) | 3 refills | Status: DC

## 2019-02-01 NOTE — Patient Instructions (Addendum)
Your blood pressure is slightly low today.  Please see your heart doctor soon, to have it rechecked I have sent a prescription to your [facility], to increase the Lantus to 220 units each morning.  Let's check the circulation.  you will receive a phone call, about a day and time for an appointment. check your blood sugar twice a day.  vary the time of day when you check, between before the 3 meals, and at bedtime.  also check if you have symptoms of your blood sugar being too high or too low.  please keep a record of the readings and bring it to your next appointment here (or you can bring the meter itself).  You can write it on any piece of paper.  please call us sooner if your blood sugar goes below 70, or if you have a lot of readings over 200. Please come back for a follow-up appointment in 2 months.

## 2019-02-01 NOTE — Progress Notes (Signed)
Subjective:    Patient ID: Calvin [name], male    DOB: Jan 22, 1961, 58 y.o.   MRN: [id number]  HPI Pt returns for f/u of diabetes mellitus: DM type: Insulin-requiring type 2. Dx'ed: 6979 Complications: polyneuropathy, CAD, renal insuff, and DR.   Therapy: insulin since soon after dx.  DKA: never Severe hypoglycemia: never.   Pancreatitis: never Pancreatic imaging: normal on 2003 CT. Other: he takes human insulin, due to cost; due to noncompliance, he is not a candidate for multiple daily injections.   Interval history: he does not check cbg's  Pt says he never misses the insulin.  Main symptom is foot pain.  He requests DM shoes.   Past Medical History:  Diagnosis Date  . Adenoidal hypertrophy   . Adenomatous colon polyps 2012  . Arthritis   . Diabetes mellitus   . GERD (gastroesophageal reflux disease)   . Heart attack ([place])    [place] living in [place]  [name] Hx of adenomatous colonic polyps 08/18/2017  . Hyperlipidemia   . Hypertension   . Mild CAD    a. cath in 08/2017 showing mild nonobstructive CAD with scattered 20-30% stenosis.   . Nonischemic cardiomyopathy ([place])    a. EF 20-25% by echo in 09/2017 with cath showing mild CAD. b.  Last echo 12/2017 EF 35-40%, grade 2 DD.  [name] Obesity   . PAF (paroxysmal atrial fibrillation) ([place])   . Sleep apnea    cpap, pt says no longer has    Past Surgical History:  Procedure Laterality Date  . COLONOSCOPY  05/13/11   9 adenomas  . FOREARM SURGERY    . MUSCLE BIOPSY    . RIGHT/LEFT HEART CATH AND CORONARY ANGIOGRAPHY N/A 08/25/2017   Procedure: RIGHT/LEFT HEART CATH AND CORONARY ANGIOGRAPHY;  Surgeon: [name], MD;  Location: [facility];  Service: Cardiovascular;  Laterality: N/A;    Social History   Socioeconomic History  . Marital status: Single    Spouse name: Not on file  . Number of children: 0  . Years of education: Not on file  . Highest education level: Not on file  Occupational History  .  Occupation: Disability  Social Needs  . Financial resource strain: Not on file  . Food insecurity    Worry: Not on file    Inability: Not on file  . Transportation needs    Medical: Not on file    Non-medical: Not on file  Tobacco Use  . Smoking status: Never Smoker  . Smokeless tobacco: Never Used  Substance and Sexual Activity  . Alcohol use: No  . Drug use: No  . Sexual activity: Yes    Birth control/protection: None  Lifestyle  . Physical activity    Days per week: Not on file    Minutes per session: Not on file  . Stress: Not on file  Relationships  . Social Herbalist on phone: Not on file    Gets together: Not on file    Attends religious service: Not on file    Active member of club or organization: Not on file    Attends meetings of clubs or organizations: Not on file    Relationship status: Not on file  . Intimate partner violence    Fear of current or ex partner: Not on file    Emotionally abused: Not on file    Physically abused: Not on file    Forced sexual activity: Not on file  Other  Topics Concern  . Not on file  Social History Narrative   Social History   Diet?    Do you drink/eat things with caffeine? yes   Marital status?       single      Do you live in a house, apartment, assisted living, condo, trailer, etc.? yes   Is it one or more stories? One story   How many persons live in your home?   Do you have any pets in your home? (please list)    Highest level of education completed? graduate   Do you exercise?            no                          Type & how often?   Advanced Directives   Do you have a living will? no   Do you have a DNR form?                                  If not, do you want to discuss one? no   Do you have signed POA/HPOA for forms? no      Functional Status   Do you have difficulty bathing or dressing yourself? no   Do you have difficulty preparing food or eating? no   Do you have difficulty managing your  medications? no   Do you have difficulty managing your finances? no   Do you have difficulty affording your medications? no    Current Outpatient Medications on File Prior to Visit  Medication Sig Dispense Refill  . amitriptyline (ELAVIL) 10 MG tablet Take 1 tablet at bed time for one week and then increase 2 tablets at bed time 60 tablet 3  . apixaban (ELIQUIS) 5 MG TABS tablet Take 5 mg by mouth 2 (two) times daily.    [name] atorvastatin (LIPITOR) 80 MG tablet Take 1 tablet (80 mg total) by mouth daily. 90 tablet 3  . carvedilol (COREG) 6.25 MG tablet Take 1 tablet (6.25 mg total) by mouth 2 (two) times daily with a meal. 180 tablet 3  . donepezil (ARICEPT) 10 MG tablet Take 1 tablet (10 mg total) by mouth at bedtime. 90 tablet 3  . Evolocumab (REPATHA) 140 MG/ML SOSY Inject into the skin every 14 (fourteen) days.    . Insulin Syringe-Needle U-100 (INSULIN SYRINGE .3CC/29GX1/2") 29G X 1/2" 0.3 ML MISC Inject 1 Syringe 3 (three) times daily as directed. Check blood sugar three times daily. Dx: E11.9 100 each 3  . lisinopril (PRINIVIL,ZESTRIL) 10 MG tablet Take 1 tablet (10 mg total) by mouth at bedtime. 90 tablet 3  . nitroGLYCERIN (NITROSTAT) 0.4 MG SL tablet Place 1 tablet (0.4 mg total) under the tongue every 5 (five) minutes as needed for chest pain. 30 tablet 1  . spironolactone (ALDACTONE) 25 MG tablet Take 0.5 tablets (12.5 mg total) by mouth daily. 45 tablet 3   No current facility-administered medications on file prior to visit.     No Known Allergies  Family History  Problem Relation Age of Onset  . Heart disease Mother   . Heart attack Mother 20  . Hypertension Mother   . Hyperlipidemia Mother   . Diabetes Mother   . Heart disease Father   . Heart attack Father 57  . Hypertension Father   . Hyperlipidemia Father   . Diabetes  Father   . Heart attack Sister 53  . Colon cancer Neg Hx   . Stomach cancer Neg Hx   . Esophageal cancer Neg Hx   . Rectal cancer Neg Hx     BP  (!) 98/52 (BP Location: Left Arm, Patient Position: Sitting, Cuff Size: Normal)   Pulse 85   Ht 5' 4"  (1.626 m)   Wt 166 lb 3.2 oz (75.4 kg)   SpO2 98%   BMI 28.53 kg/m    Review of Systems He denies hypoglycemia sxs.  Denies LOC.     Objective:   Physical Exam VITAL SIGNS:  See vs page GENERAL: no distress Pulses: dorsalis pedis intact bilat.   MSK: no deformity of the feet CV: no leg edema Skin:  no ulcer on the feet.  normal color and temp on the feet. Neuro: sensation is intact to touch on the feet Ext: there is bilateral onychomycosis of the toenails.    Lab Results  Component Value Date   HGBA1C 10.6 (A) 02/01/2019       Assessment & Plan:  Hypotension, mild Insulin-requiring type 2 DM, with renal insuff: he needs increased rx Foot pain: uncertain if ischemic in etiol.  Patient Instructions  Your blood pressure is slightly low today.  Please see your heart doctor soon, to have it rechecked I have [facility] a [facility] your [facility], to increase the Lantus to 220 units each morning.  Let's check the circulation.  you will receive a phone call, about a day and time for an appointment. check your blood sugar twice a day.  vary the time of day when you check, between before the 3 meals, and at bedtime.  also check if you have symptoms of your blood sugar being too high or too low.  please keep a record of the readings and bring it to your next appointment here (or you can bring the meter itself).  You can write it on any piece of paper.  please call us sooner if your blood sugar goes below 70, or if you have a lot of readings over 200. Please come back for a follow-up appointment in 2 months.

## 2019-02-04 ENCOUNTER — Ambulatory Visit: Payer: Medicare HMO | Admitting: Nurse Practitioner

## 2019-02-06 ENCOUNTER — Telehealth: Payer: Self-pay

## 2019-02-06 ENCOUNTER — Other Ambulatory Visit (HOSPITAL_COMMUNITY): Payer: Self-pay

## 2019-02-06 NOTE — Progress Notes (Signed)
Paramedicine Encounter    Patient ID: Calvin [name], male    DOB: 08-04-1960, 58 y.o.   MRN: [id number]   [name], NP as PCP - General (Geriatric Medicine) [name], MD as PCP - Cardiology (Cardiology) [name], [name], MD as PCP - Advanced Heart Failure (Cardiology) [name], MD as Consulting Physician (Endocrinology)  Patient Active Problem List   Diagnosis Date Noted  . Foot pain, bilateral 02/01/2019  . Abdominal pain 01/23/2019  . Chest pain 01/23/2019  . Right leg pain 12/20/2018  . Overgrown toenails 12/20/2018  . Unsteady gait 12/20/2018  . Encounter for therapeutic drug monitoring 10/22/2018  . Nausea and vomiting 03/10/2018  . AF (paroxysmal atrial fibrillation) ([place]) 03/10/2018  . Non-cardiac chest pain 03/09/2018  . Diabetic neuropathy ([place]) 09/26/2017  . MCI (mild cognitive impairment) 06/23/2017  . OSA on CPAP 05/10/2017  . Gastroesophageal reflux disease 05/10/2017  . Insomnia 05/10/2017  . Onychomycosis of multiple toenails with type 2 diabetes mellitus and peripheral neuropathy ([place]) 05/10/2017  . Chronic systolic heart failure ([place]) 11/22/2015  . Essential hypertension 11/22/2015  . DM (diabetes mellitus) ([place]) 11/20/2015  . HLD (hyperlipidemia) 11/20/2015  . Hx of adenomatous colonic polyps 05/19/2011    Current Outpatient Medications:  .  amitriptyline (ELAVIL) 10 MG tablet, Take 1 tablet at bed time for one week and then increase 2 tablets at bed time, Disp: 60 tablet, Rfl: 3 .  apixaban (ELIQUIS) 5 MG TABS tablet, Take 5 mg by mouth 2 (two) times daily., Disp: , Rfl:  .  atorvastatin (LIPITOR) 80 MG tablet, Take 1 tablet (80 mg total) by mouth daily., Disp: 90 tablet, Rfl: 3 .  carvedilol (COREG) 6.25 MG tablet, Take 1 tablet (6.25 mg total) by mouth 2 (two) times daily with a meal., Disp: 180 tablet, Rfl: 3 .  donepezil (ARICEPT) 10 MG tablet, Take 1 tablet (10 mg total) by mouth at bedtime.,  Disp: 90 tablet, Rfl: 3 .  Evolocumab (REPATHA) 140 MG/ML SOSY, Inject into the skin every 14 (fourteen) days., Disp: , Rfl:  .  glucose blood (CONTOUR TEST) test strip, 1 each by Other route 2 (two) times a day., Disp: 200 each, Rfl: 3 .  Insulin Glargine, 2 Unit Dial, (TOUJEO MAX SOLOSTAR) 300 UNIT/ML SOPN, Inject 220 Units into the skin every morning., Disp: 25 pen, Rfl: 11 .  Insulin Syringe-Needle U-100 (INSULIN SYRINGE .3CC/29GX1/2") 29G X 1/2" 0.3 ML MISC, Inject 1 Syringe 3 (three) times daily as directed. Check blood sugar three times daily. Dx: E11.9, Disp: 100 each, Rfl: 3 .  lisinopril (PRINIVIL,ZESTRIL) 10 MG tablet, Take 1 tablet (10 mg total) by mouth at bedtime., Disp: 90 tablet, Rfl: 3 .  nitroGLYCERIN (NITROSTAT) 0.4 MG SL tablet, Place 1 tablet (0.4 mg total) under the tongue every 5 (five) minutes as needed for chest pain., Disp: 30 tablet, Rfl: 1 .  spironolactone (ALDACTONE) 25 MG tablet, Take 0.5 tablets (12.5 mg total) by mouth daily., Disp: 45 tablet, Rfl: 3 No Known Allergies    Social History   Socioeconomic History  . Marital status: Single    Spouse name: Not on file  . Number of children: 0  . Years of education: Not on file  . Highest education level: Not on file  Occupational History  . Occupation: Disability  Social Needs  . Financial resource strain: Not on file  . Food insecurity    Worry: Not on file    Inability: Not on file  .  Transportation needs    Medical: Not on file    Non-medical: Not on file  Tobacco Use  . Smoking status: Never Smoker  . Smokeless tobacco: Never Used  Substance and Sexual Activity  . Alcohol use: No  . Drug use: No  . Sexual activity: Yes    Birth control/protection: None  Lifestyle  . Physical activity    Days per week: Not on file    Minutes per session: Not on file  . Stress: Not on file  Relationships  . Social Herbalist on phone: Not on file    Gets together: Not on file    Attends religious  service: Not on file    Active member of club or organization: Not on file    Attends meetings of clubs or organizations: Not on file    Relationship status: Not on file  . Intimate partner violence    Fear of current or ex partner: Not on file    Emotionally abused: Not on file    Physically abused: Not on file    Forced sexual activity: Not on file  Other Topics Concern  . Not on file  Social History Narrative   Social History   Diet?    Do you drink/eat things with caffeine? yes   Marital status?       single      Do you live in a house, apartment, assisted living, condo, trailer, etc.? yes   Is it one or more stories? One story   How many persons live in your home?   Do you have any pets in your home? (please list)    Highest level of education completed? graduate   Do you exercise?            no                          Type & how often?   Advanced Directives   Do you have a living will? no   Do you have a DNR form?                                  If not, do you want to discuss one? no   Do you have signed POA/HPOA for forms? no      Functional Status   Do you have difficulty bathing or dressing yourself? no   Do you have difficulty preparing food or eating? no   Do you have difficulty managing your medications? no   Do you have difficulty managing your finances? no   Do you have difficulty affording your medications? no    Physical Exam      Future Appointments  Date Time Provider [place]  02/07/2019  3:00 PM [facility] VASC 3 [facility]  02/13/2019  8:00 AM Mervyn Gay, PT [facility]  04/04/2019  9:45 AM [name], MD [facility] None  04/22/2019  3:00 PM Marzetta Board, DPM [facility]  05/01/2019 11:00 AM [name], NP [facility] None    BP 130/80   Pulse 86   Temp 97.7 F (36.5 C)   Resp 18   Wt 166 lb (75.3 kg)   SpO2 99%   BMI 28.49 kg/m   CBG PTA-345  Weight yesterday-166 Last visit  weight-165  Pt reports he has no complaints today. He denies c/p,  no dizziness, no sob, he states he has been doing well.  He goes tomor for appoint for his leg pain.  He has to call his insurance to see what test strips are covered, however he is good on all his test supplies for right now and does not need anything at the moment.  He states he will be going to PT next week. meds verified and pill box refilled. He did not get the eliquis from pharmacy last week that I called in for him last week, so that is the only med he has to place in pill box and he is able to do that. I have been trying to work with him to fill his own pill box but it seems like there has been something going on at each visit to where he is overwhelmed with some family issues. Today he didn't have all the meds so next week I will start with him on teaching the pill box.   Marylouise Stacks, [facility] Paramedic  02/06/19

## 2019-02-06 NOTE — Telephone Encounter (Signed)
Received notification from [facility] that Contour test strips are no longer covered by plan. Called pt to make him aware. Asked that he please call his insurance company to inquire further about what they cover and to call us back so orders can be placed. Verbalized acceptance and understanding.

## 2019-02-07 ENCOUNTER — Other Ambulatory Visit: Payer: Self-pay | Admitting: Endocrinology

## 2019-02-07 ENCOUNTER — Other Ambulatory Visit: Payer: Self-pay

## 2019-02-07 ENCOUNTER — Ambulatory Visit (HOSPITAL_COMMUNITY)
Admission: RE | Admit: 2019-02-07 | Discharge: 2019-02-07 | Disposition: A | Discharge status: Home / Self Care | Payer: Medicare HMO | Source: Ambulatory Visit | Attending: Cardiology | Admitting: Cardiology

## 2019-02-07 DIAGNOSIS — M79671 Pain in right foot: Secondary | ICD-10-CM | POA: Insufficient documentation

## 2019-02-07 DIAGNOSIS — I739 Peripheral vascular disease, unspecified: Secondary | ICD-10-CM | POA: Diagnosis not present

## 2019-02-07 DIAGNOSIS — M79672 Pain in left foot: Secondary | ICD-10-CM | POA: Diagnosis not present

## 2019-02-13 ENCOUNTER — Other Ambulatory Visit: Payer: Self-pay

## 2019-02-13 ENCOUNTER — Encounter: Payer: Self-pay | Admitting: Rehabilitative and Restorative Service Providers"

## 2019-02-13 ENCOUNTER — Other Ambulatory Visit (HOSPITAL_COMMUNITY): Payer: Self-pay

## 2019-02-13 ENCOUNTER — Ambulatory Visit: Payer: Medicare HMO | Attending: Family | Admitting: Rehabilitative and Restorative Service Providers"

## 2019-02-13 DIAGNOSIS — R42 Dizziness and giddiness: Secondary | ICD-10-CM

## 2019-02-13 DIAGNOSIS — R293 Abnormal posture: Secondary | ICD-10-CM

## 2019-02-13 DIAGNOSIS — R2689 Other abnormalities of gait and mobility: Secondary | ICD-10-CM | POA: Diagnosis not present

## 2019-02-13 DIAGNOSIS — R29818 Other symptoms and signs involving the nervous system: Secondary | ICD-10-CM | POA: Insufficient documentation
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
## 2019-02-13 NOTE — Therapy (Signed)
[facility] [address] [place], [place], [address] Phone: [phone]   Fax:  [phone]  Physical Therapy Treatment  Patient Details  Name: Calvin [name] MRN: [id number] Date of Birth: 1960-12-30 Referring Provider (PT): Marlowe Sax, NP  CLINIC OPERATION CHANGES: [facility] is open at lower capacity following universal masking, social distancing, and patient screening.  The patient's COVID risk of complications score is 4.  Encounter Date: 02/13/2019  PT End of Session - 02/13/19 0923    Visit Number  1    Number of Visits  9    Date for PT Re-Evaluation  04/14/19    Authorization Type  [employer] $40 copay    PT Start Time  0810    PT Stop Time  0850    PT Time Calculation (min)  40 min    Activity Tolerance  Patient tolerated treatment well    Behavior During Therapy  [facility] for tasks assessed/performed       Past Medical History:  Diagnosis Date  . Adenoidal hypertrophy   . Adenomatous colon polyps 2012  . Arthritis   . Diabetes mellitus   . GERD (gastroesophageal reflux disease)   . Heart attack ([place])    [place] living in [place]  [name] Hx of adenomatous colonic polyps 08/18/2017  . Hyperlipidemia   . Hypertension   . Mild CAD    a. cath in 08/2017 showing mild nonobstructive CAD with scattered 20-30% stenosis.   . Nonischemic cardiomyopathy ([place])    a. EF 20-25% by echo in 09/2017 with cath showing mild CAD. b.  Last echo 12/2017 EF 35-40%, grade 2 DD.  [name] Obesity   . PAF (paroxysmal atrial fibrillation) ([place])   . Sleep apnea    cpap, pt says no longer has    Past Surgical History:  Procedure Laterality Date  . COLONOSCOPY  05/13/11   9 adenomas  . FOREARM SURGERY    . MUSCLE BIOPSY    . RIGHT/LEFT HEART CATH AND CORONARY ANGIOGRAPHY N/A 08/25/2017   Procedure: RIGHT/LEFT HEART CATH AND CORONARY ANGIOGRAPHY;  Surgeon: [name], MD;  Location: [facility];   Service: Cardiovascular;  Laterality: N/A;    There were no vitals filed for this visit.  Subjective Assessment - 02/13/19 0816    Subjective  The patient reports sudden onset of chest pain in July, 2020 admitted to the hospital with dizziness, lightheadedness.  He reports his sugar has been high up in the 400s (took this morning), which can make him feel bad.  He reports spinning with large head movements, lightheadedness when looking down.  He reports something in the left leg is "sticking" and he has an antalgic appearing gait.  He forgot his RW today (left the hospital with a walker).  He also has intermittent R sided numbness.    Pertinent History  PMH-chronic systolic heart failure due to NICM with EF 35-40%, uncontrolled DMII, GERD, HTN, MI 2015, memory issues, and hyperlipidemia    Diagnostic tests  MRI cervical spine 06/13/18 "No compressive central canal stenosis. Foraminal narrowing that could possibly cause neural compression at C3-4, C4-5 and C5-6."    Patient Stated Goals  He is hoping wiht dizziness in his head, and pain in his leg.    Currently in Pain?  Yes    Pain Score  8    "I try to block it out"   Pain Location  Knee    Pain Orientation  Left  Pain Type  Acute pain    Pain Onset  1 to 4 weeks ago    Pain Frequency  Constant    Effect of Pain on Daily Activities  PAIN HAS BEEN EVALUATED:  PT recommended patient see primary care MD for calf pain.  He reports he saw MD and had doppler/ultrasound last week.         [facility] PT Assessment - 02/13/19 0826      Assessment   Medical Diagnosis  Dizziness, R unilateral vestibular hypofunction    Referring Provider (PT)  Marlowe Sax, NP    Hand Dominance  Right    Prior Therapy  therapy in [facility]/ Acute      Precautions   Precautions  Fall      Balance Screen   Has the patient fallen in the past 6 months  Yes    How many times?  12-13:  he reports he is tripping frequently    Has the patient had a  decrease in activity level because of a fear of falling?   Yes    Is the patient reluctant to leave their home because of a fear of falling?   No      Home Social worker  Private residence    Living Arrangements  Non-relatives/Friends    Type of [place] to enter    Entrance Stairs-Number of Steps  12-14    Entrance Stairs-Rails  Can reach both    Home Layout  Two level    [place] - 2 wheels;Cane - single point      Prior Function   Level of Independence  Independent    Vocation  On disability   diabetes, heart trouble     Sensation   Light Touch  Appears Intact    Additional Comments  Has intermittent episodes lasting 30-45 minutes of R sided numbness (arm and leg)  occuring 2x/week      ROM / Strength   AROM / PROM / Strength  Strength      Strength   Overall Strength Comments  4/5 bilateral hip flexion, 5/5 bilateral knee flexion/extension and 4+/5 bilateral ankle DF      Ambulation/Gait   Ambulation/Gait  Yes    Ambulation/Gait Assistance  5: Supervision    Ambulation/Gait Assistance Details  The patient walks into clinic without a device with antalgic gait pattern and hitting foot flat bilaterally    Ambulation Distance (Feet)  150 Feet    Assistive device  None    Gait Pattern  Antalgic;Decreased stride length;Left foot flat;Narrow base of support;Poor foot clearance - left    Ambulation Surface  Level;Indoor    Gait velocity  2.06 ft/sec         Vestibular Assessment - 02/13/19 0830      Vestibular Assessment   General Observation  Patient notes intermittent dizziness worse with looking down.      Symptom Behavior   Subjective history of current problem  He trips frequently and has L leg pain and both legs give way.    Type of Dizziness   Imbalance;Lightheadedness;"Funny feeling in head"    Frequency of Dizziness  intermittent    Duration of Dizziness  minutes    Symptom Nature   Intermittent;Motion provoked    Aggravating Factors  Forward bending    Relieving Factors  Head stationary      Oculomotor Exam  Oculomotor Alignment  Normal    Ocular ROM  Notes visual changes from diabetes with everything blurry noting due to high blood sugar, he cannot get glasses    Spontaneous  Absent    Gaze-induced   Absent    Smooth Pursuits  Intact    Saccades  Intact;Slow      Vestibulo-Ocular Reflex   VOR 1 Head Only (x 1 viewing)  VOR x 1 provokes dizziness and visual blurring noted within 5 reps    Comment  head impulse test=Unable to accurately assess due to muscle guarding      Positional Testing   Sidelying Test  Sidelying Right;Sidelying Left      Sidelying Right   Sidelying Right Duration  lightheaded sensation, no nystagmus viewed in room light    Sidelying Right Symptoms  No nystagmus      Sidelying Left   Sidelying Left Duration  no    Sidelying Left Symptoms  No nystagmus                       PT Education - 02/13/19 4098    Education Details  Habituation for vertical head motion; nature of PT; need to use a RW.    Person(s) Educated  Patient    Methods  Explanation    Comprehension  Verbalized understanding       PT Short Term Goals - 02/13/19 0925      PT SHORT TERM GOAL #1   Title  STGs=LTGs        PT Long Term Goals - 02/13/19 0926      PT LONG TERM GOAL #1   Title  The patient will return demo HEP for vestibular habituation, L E stretching, general strengthening and mobility.    Time  4    Period  Weeks    Target Date  04/14/19   due to wait to begin treatment b/c of schedule availability     PT LONG TERM GOAL #2   Title  The patient will look down and return to midline without subjective report of dizziness.    Time  4    Period  Weeks    Target Date  04/14/19      PT LONG TERM GOAL #3   Title  The patient will improve gait speed from 2.06 ft/sec to > or equal to 2.62 ft/sec to demo improving functional  mobility.    Time  4    Period  Weeks    Target Date  04/14/19      PT LONG TERM GOAL #4   Title  The patient will be further assessed on balance and goal to follow.    Time  4    Period  Weeks    Target Date  04/14/19      PT LONG TERM GOAL #5   Title  P[place]et will report L knee/leg pain < or equal to 3/10.    Baseline  baseline is 8/10    Time  4    Period  Weeks    Target Date  04/14/19            Plan - 02/13/19 0936    Clinical Impression Statement  The patient is a 58 year old male presenting to OP physical therapy with significant medical comorbidities reporting blood sugar >400 daily.  He had an onset of imbalance and dizziness in July and was referred for R unilateral vestibular hypofunction.  He has blurred  vision at rest due to elevated blood sugar and also has worsening vision + dizziness with gaze adaptation.  Patient also c/o positional motion sensitivity with looking down and return to sitting. Bilateral sidelying test was negative for nystagmus.  His gait is slowed with L antalgic pattern.  PT and patient discussed pain and patient was able to report that he had a doppler study last week that was negative.  Therefore, evaluation was able to be performed.    Personal Factors and Comorbidities  Comorbidity 1;Comorbidity 3+;Comorbidity 2;Behavior Pattern;Social Background;Education    Comorbidities  HTN, chronic systolic heart failure, uncontrolled diabetes, HTN, MI 2015, memory issues, frequent falls    Examination-Activity Limitations  Bed Mobility;Bend;Locomotion Level;Stairs;Stand    Examination-Participation Restrictions  Community Activity;Shop    Stability/Clinical Decision Making  Unstable/Unpredictable    Clinical Decision Making  High    Clinical Presentation due to:  elevated blood sugar, frequent falls, educatoin/social background, new onset of L leg pain, new onset of dizziness    Rehab Potential  Good    PT Frequency  2x / week   + eval   PT Duration  4  weeks    PT Treatment/Interventions  ADLs/Self Care Home Management;Aquatic Therapy;Biofeedback;Electrical Stimulation;Ultrasound;Moist Heat;Therapeutic exercise;Neuromuscular re-education;Cognitive remediation;Patient/family education;Manual techniques;Passive range of motion;Dry needling    PT Next Visit Plan  recheck vertical head motion habituation; add HEP for L LE stretching, work on LE strengthening, provide HEP  *ASK ABOUT BLOOD SUGAR*  Not indicated to exercise if >250 blood sugar.  PT can continue to add VOR and habituation, but would not add any strengthening/mobility HEP.  This appears to be patient's resting blood sugar level per his report.    Consulted and Agree with Plan of Care  Patient       Patient will benefit from skilled therapeutic intervention in order to improve the following deficits and impairments:  Pain, Dizziness  Visit Diagnosis: 1. Dizziness and giddiness   2. Abnormal posture   3. Other symptoms and signs involving the nervous system   4. Other abnormalities of gait and mobility        Problem List Patient Active Problem List   Diagnosis Date Noted  . Foot pain, bilateral 02/01/2019  . Abdominal pain 01/23/2019  . Chest pain 01/23/2019  . Right leg pain 12/20/2018  . Overgrown toenails 12/20/2018  . Unsteady gait 12/20/2018  . Encounter for therapeutic drug monitoring 10/22/2018  . Nausea and vomiting 03/10/2018  . AF (paroxysmal atrial fibrillation) ([place]) 03/10/2018  . Non-cardiac chest pain 03/09/2018  . Diabetic neuropathy ([place]) 09/26/2017  . MCI (mild cognitive impairment) 06/23/2017  . OSA on CPAP 05/10/2017  . Gastroesophageal reflux disease 05/10/2017  . Insomnia 05/10/2017  . Onychomycosis of multiple toenails with type 2 diabetes mellitus and peripheral neuropathy ([place]) 05/10/2017  . Chronic systolic heart failure ([place]) 11/22/2015  . Essential hypertension 11/22/2015  . DM (diabetes mellitus) ([place]) 11/20/2015  . HLD (hyperlipidemia)  11/20/2015  . Hx of adenomatous colonic polyps 05/19/2011    Nada Godley, PT 02/13/2019, 9:43 AM  [facility] [address] [place], [place], [address] Phone: [phone]   Fax:  [phone]  Name: Calvin [name] MRN: [id number] Date of Birth: April 02, 1961

## 2019-02-13 NOTE — Patient Instructions (Signed)
Head Motion: Up and Down    Sitting, slowly move head up with eyes open. Hold position until symptoms subside. Then, move head in opposite direction. Hold position until symptoms subside. Repeat __3__ times per session. Do _2-3___ sessions per day.  Copyright  VHI. All rights reserved.

## 2019-02-18 ENCOUNTER — Ambulatory Visit: Payer: Medicare HMO | Admitting: Physical Therapy

## 2019-02-20 ENCOUNTER — Other Ambulatory Visit (HOSPITAL_COMMUNITY): Payer: Self-pay

## 2019-02-20 ENCOUNTER — Ambulatory Visit: Payer: Medicare HMO | Admitting: Physical Therapy

## 2019-02-20 ENCOUNTER — Encounter: Payer: Self-pay | Admitting: Rehabilitative and Restorative Service Providers"

## 2019-02-20 NOTE — Progress Notes (Signed)
Paramedicine Encounter    Patient ID: Calvin [name], male    DOB: 09-Feb-1961, 58 y.o.   MRN: [id number]   [name], NP as PCP - General (Geriatric Medicine) [name], MD as PCP - Cardiology (Cardiology) [name], [name], MD as PCP - Advanced Heart Failure (Cardiology) [name], MD as Consulting Physician (Endocrinology)  Patient Active Problem List   Diagnosis Date Noted  . Foot pain, bilateral 02/01/2019  . Abdominal pain 01/23/2019  . Chest pain 01/23/2019  . Right leg pain 12/20/2018  . Overgrown toenails 12/20/2018  . Unsteady gait 12/20/2018  . Encounter for therapeutic drug monitoring 10/22/2018  . Nausea and vomiting 03/10/2018  . AF (paroxysmal atrial fibrillation) ([place]) 03/10/2018  . Non-cardiac chest pain 03/09/2018  . Diabetic neuropathy ([place]) 09/26/2017  . MCI (mild cognitive impairment) 06/23/2017  . OSA on CPAP 05/10/2017  . Gastroesophageal reflux disease 05/10/2017  . Insomnia 05/10/2017  . Onychomycosis of multiple toenails with type 2 diabetes mellitus and peripheral neuropathy ([place]) 05/10/2017  . Chronic systolic heart failure ([place]) 11/22/2015  . Essential hypertension 11/22/2015  . DM (diabetes mellitus) ([place]) 11/20/2015  . HLD (hyperlipidemia) 11/20/2015  . Hx of adenomatous colonic polyps 05/19/2011    Current Outpatient Medications:  .  amitriptyline (ELAVIL) 10 MG tablet, Take 1 tablet at bed time for one week and then increase 2 tablets at bed time, Disp: 60 tablet, Rfl: 3 .  apixaban (ELIQUIS) 5 MG TABS tablet, Take 5 mg by mouth 2 (two) times daily., Disp: , Rfl:  .  atorvastatin (LIPITOR) 80 MG tablet, Take 1 tablet (80 mg total) by mouth daily., Disp: 90 tablet, Rfl: 3 .  carvedilol (COREG) 6.25 MG tablet, Take 1 tablet (6.25 mg total) by mouth 2 (two) times daily with a meal., Disp: 180 tablet, Rfl: 3 .  donepezil (ARICEPT) 10 MG tablet, Take 1 tablet (10 mg total) by mouth at bedtime.,  Disp: 90 tablet, Rfl: 3 .  Evolocumab (REPATHA) 140 MG/ML SOSY, Inject into the skin every 14 (fourteen) days., Disp: , Rfl:  .  glucose blood (CONTOUR TEST) test strip, 1 each by Other route 2 (two) times a day., Disp: 200 each, Rfl: 3 .  Insulin Glargine, 2 Unit Dial, (TOUJEO MAX SOLOSTAR) 300 UNIT/ML SOPN, Inject 220 Units into the skin every morning., Disp: 25 pen, Rfl: 11 .  Insulin Syringe-Needle U-100 (INSULIN SYRINGE .3CC/29GX1/2") 29G X 1/2" 0.3 ML MISC, Inject 1 Syringe 3 (three) times daily as directed. Check blood sugar three times daily. Dx: E11.9, Disp: 100 each, Rfl: 3 .  lisinopril (PRINIVIL,ZESTRIL) 10 MG tablet, Take 1 tablet (10 mg total) by mouth at bedtime., Disp: 90 tablet, Rfl: 3 .  spironolactone (ALDACTONE) 25 MG tablet, Take 0.5 tablets (12.5 mg total) by mouth daily., Disp: 45 tablet, Rfl: 3 .  nitroGLYCERIN (NITROSTAT) 0.4 MG SL tablet, Place 1 tablet (0.4 mg total) under the tongue every 5 (five) minutes as needed for chest pain. (Patient not taking: Reported on 02/20/2019), Disp: 30 tablet, Rfl: 1 No Known Allergies    Social History   Socioeconomic History  . Marital status: Single    Spouse name: Not on file  . Number of children: 0  . Years of education: Not on file  . Highest education level: Not on file  Occupational History  . Occupation: Disability  Social Needs  . Financial resource strain: Not on file  . Food insecurity    Worry: Not on file  Inability: Not on file  . Transportation needs    Medical: Not on file    Non-medical: Not on file  Tobacco Use  . Smoking status: Never Smoker  . Smokeless tobacco: Never Used  Substance and Sexual Activity  . Alcohol use: No  . Drug use: No  . Sexual activity: Yes    Birth control/protection: None  Lifestyle  . Physical activity    Days per week: Not on file    Minutes per session: Not on file  . Stress: Not on file  Relationships  . Social Herbalist on phone: Not on file    Gets  together: Not on file    Attends religious service: Not on file    Active member of club or organization: Not on file    Attends meetings of clubs or organizations: Not on file    Relationship status: Not on file  . Intimate partner violence    Fear of current or ex partner: Not on file    Emotionally abused: Not on file    Physically abused: Not on file    Forced sexual activity: Not on file  Other Topics Concern  . Not on file  Social History Narrative   Social History   Diet?    Do you drink/eat things with caffeine? yes   Marital status?       single      Do you live in a house, apartment, assisted living, condo, trailer, etc.? yes   Is it one or more stories? One story   How many persons live in your home?   Do you have any pets in your home? (please list)    Highest level of education completed? graduate   Do you exercise?            no                          Type & how often?   Advanced Directives   Do you have a living will? no   Do you have a DNR form?                                  If not, do you want to discuss one? no   Do you have signed POA/HPOA for forms? no      Functional Status   Do you have difficulty bathing or dressing yourself? no   Do you have difficulty preparing food or eating? no   Do you have difficulty managing your medications? no   Do you have difficulty managing your finances? no   Do you have difficulty affording your medications? no    Physical Exam      Future Appointments  Date Time Provider [place]  04/04/2019  9:45 AM [name], MD [facility] None  04/22/2019  3:00 PM Marzetta Board, DPM [facility]  05/01/2019 11:00 AM [name], NP [facility] None    BP 126/72   Pulse 92   Temp (!) 97.2 F (36.2 C)   Resp 16   Wt 165 lb (74.8 kg)   SpO2 99%   BMI 28.32 kg/m  CBG PTA-410  Weight yesterday-165 Last visit weight-166  Pt reports he is feeling good, he states he feels tired easier,  he is active during the day though.  He was unable to do therapy  due to $45 each visit and they had him scheduled for twice a week--unable to afford.  Pt states his h/a is much better, still not sleeping too well.  He will have lingering dizziness from his prior stroke.  Pt denies sob, no c/p, no edema noted.  He states he will be leaving out of town tomor and will call me next Monday to verify if he will back in town for visit next week.  I advised him next visit I want him to fill pill box to verify he can do it correctly and after 2-3 times of that then he can be d/c from my standpoint, his CBG's still running elevated but he is in contact with his doc for this. He makes his appointments and knows how to resch them if he needs to.   Marylouise Stacks, [facility] Paramedic  02/21/19

## 2019-02-20 NOTE — Therapy (Signed)
[facility] [address] [place], [place], [address] Phone: [phone]   Fax:  [phone]  Patient Details  Name: Calvin [name] MRN: [id number] Date of Birth: [birth year] Referring Provider:  Marlowe Sax, NP  Encounter Date: 02/20/2019  PHYSICAL THERAPY DISCHARGE SUMMARY  Visits from Start of Care: eval only  Current functional level related to goals / functional outcomes: See initial evaluation for patient status and deficits.  Not able to come to therapy due to financial constraints. Requests discharge.   Remaining deficits: See eval   Education / Equipment: See eval  Plan: Patient agrees to discharge.  Patient goals were not met. Patient is being discharged due to the physician's request.  ?????       Thank you for the referral of this patient. Rudell Cobb, MPT  [place] 02/20/2019, 9:45 AM  [facility] [address] [place], [place], [address] Phone: [phone]   Fax:  [phone]

## 2019-02-28 ENCOUNTER — Encounter: Payer: Medicare HMO | Admitting: Physical Therapy

## 2019-03-04 ENCOUNTER — Encounter: Payer: Medicare HMO | Admitting: Physical Therapy

## 2019-03-06 ENCOUNTER — Encounter: Payer: Medicare HMO | Admitting: Rehabilitative and Restorative Service Providers"

## 2019-03-06 ENCOUNTER — Telehealth (HOSPITAL_COMMUNITY): Payer: Self-pay

## 2019-03-06 NOTE — Telephone Encounter (Signed)
I called pt to see what time he would be home this evening and he answered stating he is still out of town and will be home tomor so I will see him tomor afternoon.    Marylouise Stacks, EMT-Paramedic  03/06/19

## 2019-03-07 ENCOUNTER — Telehealth (HOSPITAL_COMMUNITY): Payer: Self-pay

## 2019-03-07 NOTE — Telephone Encounter (Signed)
Pt reports that he is still out of town and will not be available for appointment today. He request that we just follow up next week.   Marylouise Stacks, EMT-Paramedic  03/07/19

## 2019-03-08 ENCOUNTER — Telehealth (HOSPITAL_COMMUNITY): Payer: Self-pay | Admitting: Licensed Clinical Social Worker

## 2019-03-08 NOTE — Telephone Encounter (Signed)
CSW reached out to pt to check in regarding food and medication status at this time. Pt reports he is doing well at this time and has everything he needs.  CSW inquired if pt is still struggling with prescription costs and he confirmed that he is.    CSW assisted pt in applying for Extra Help program which if he is approved would help with prescription costs.  CSW encouraged pt to reach out with any concerns and will continue to follow and assist as needed  Jorge Ny, [place] Worker [facility] [phone]

## 2019-03-09 IMAGING — CT CT ANGIO CHEST-ABD-PELV FOR DISSECTION W/ AND WO/W CM
2 of 9 series · 13 of 46 positions shown, 15 images · IV contrast (iopamidol)
Comparison: CXR 10/14/2017, CT chest, abdomen and pelvis from
12/20/2011

CLINICAL DATA: Left-sided chest pain radiating to the left arm with
dizziness and dyspnea. History of myocardial infarction and
diabetes.

EXAM:
CT ANGIOGRAPHY CHEST, ABDOMEN AND PELVIS
TECHNIQUE: Multidetector CT imaging through the chest, abdomen and pelvis was
performed using the standard protocol during bolus administration of
intravenous contrast. Multiplanar reconstructed images and MIPs were
obtained and reviewed to evaluate the vascular anatomy.
CONTRAST:  100mL CMIDM2-STO IOPAMIDOL (CMIDM2-STO) INJECTION 76%

[Series 6: dissection 3.0 i30f 3 · axial · 0.73mm/px · z∈[-792,-231]mm · 10 of 213 slices shown, 12 images]
[im 13/213  soft-tissue]
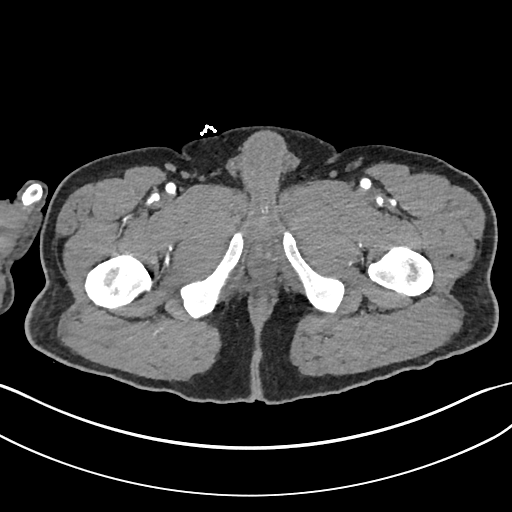
[im 13/213  bone]
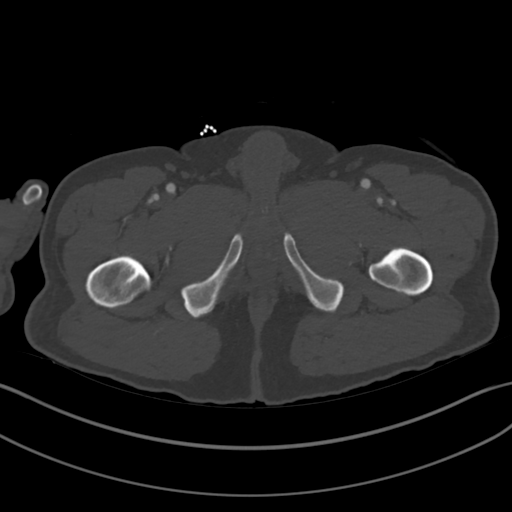
[im 38/213  soft-tissue]
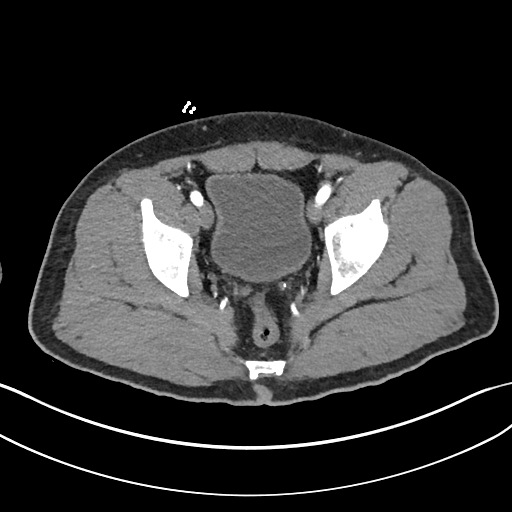
[im 63/213  soft-tissue]
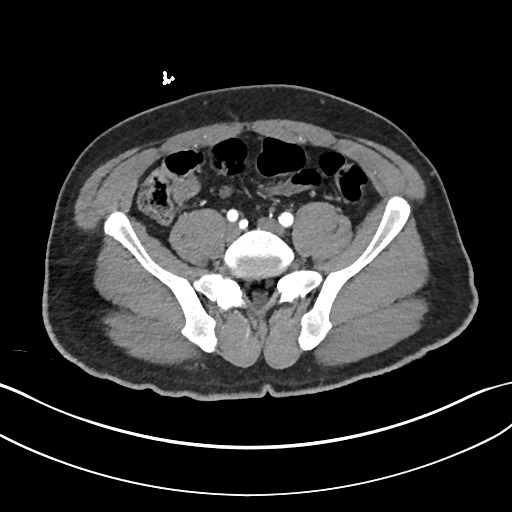
[im 75/213  soft-tissue]
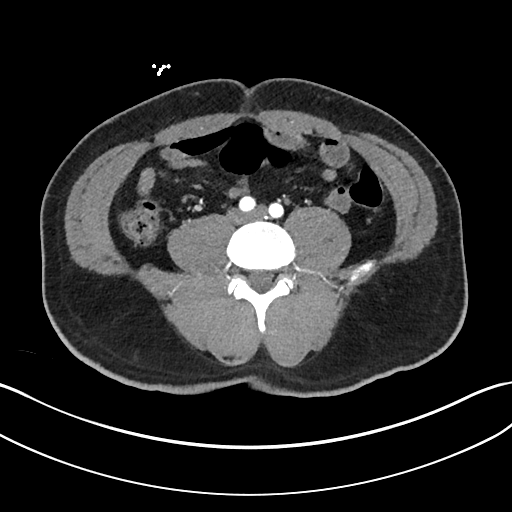
[im 100/213  soft-tissue]
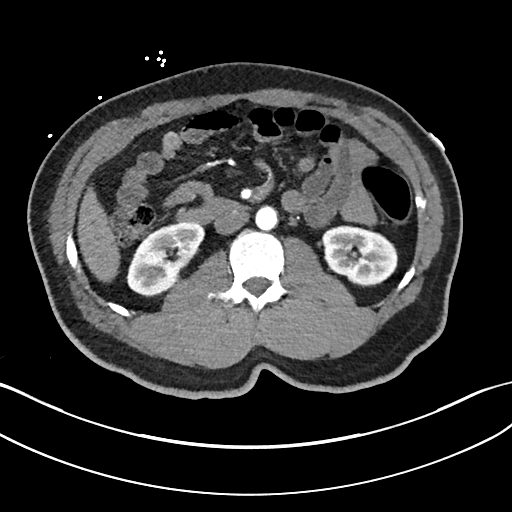
[im 113/213  soft-tissue]
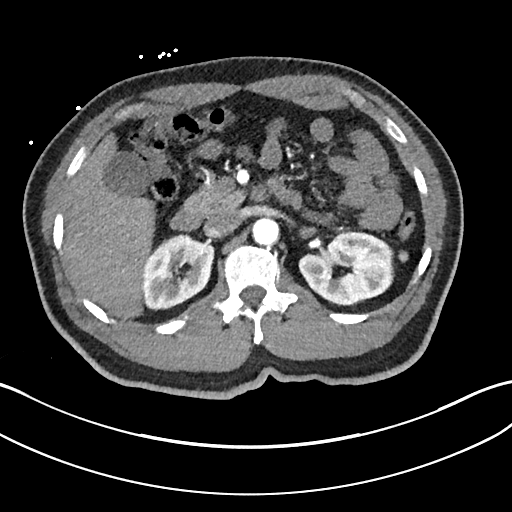
[im 138/213  soft-tissue]
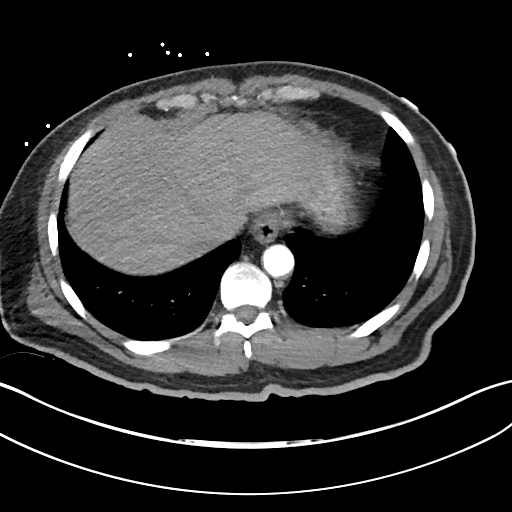
[im 163/213  soft-tissue]
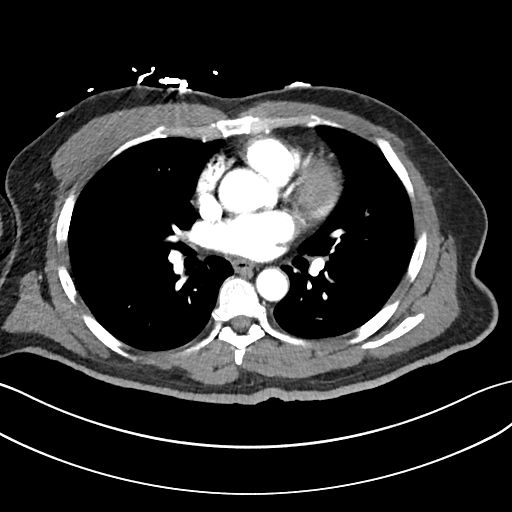
[im 175/213  soft-tissue]
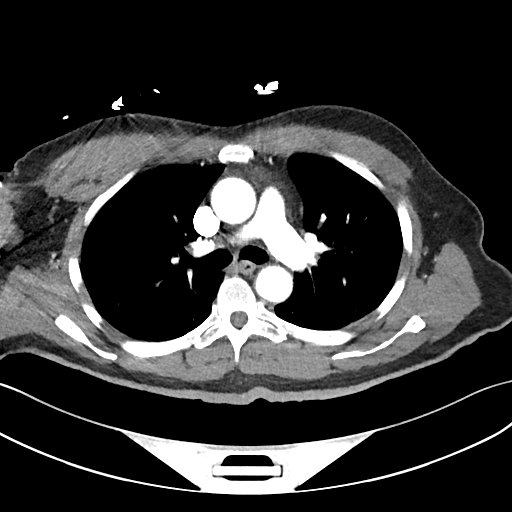
[im 175/213  bone]
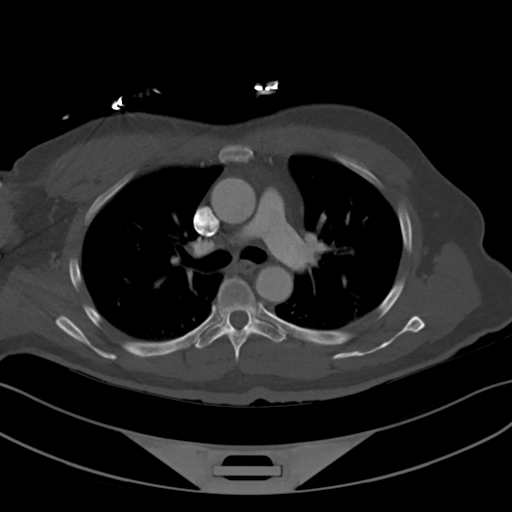
[im 200/213  soft-tissue]
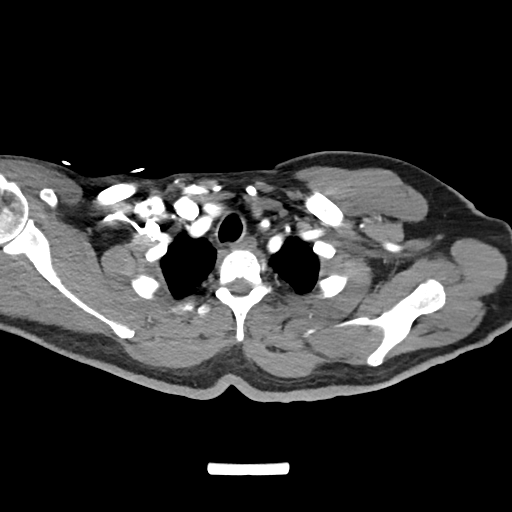

[Series 9: coronals · coronal · 0.76mm/px · 3 of 180 slices shown]
[im 45/180  soft-tissue]
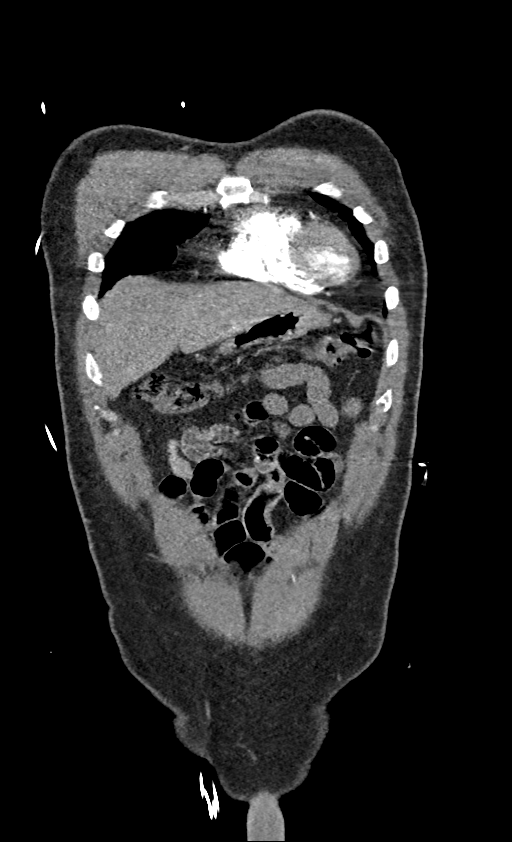
[im 90/180  soft-tissue]
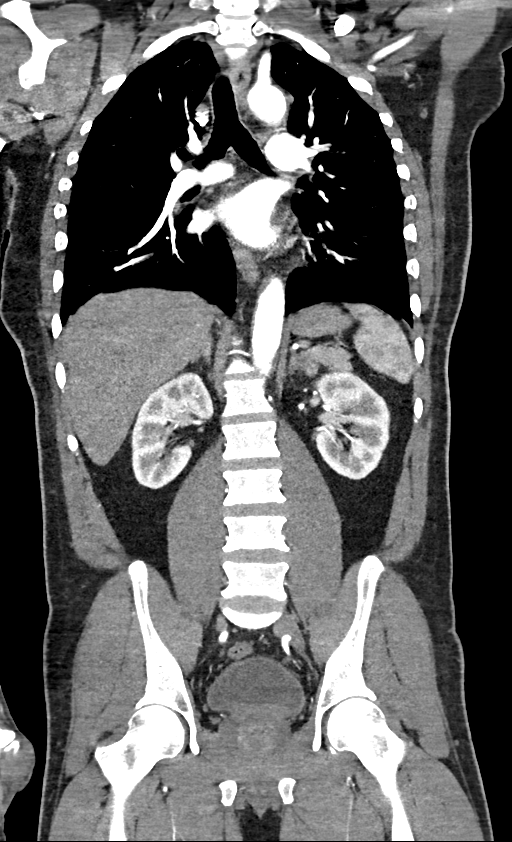
[im 135/180  soft-tissue]
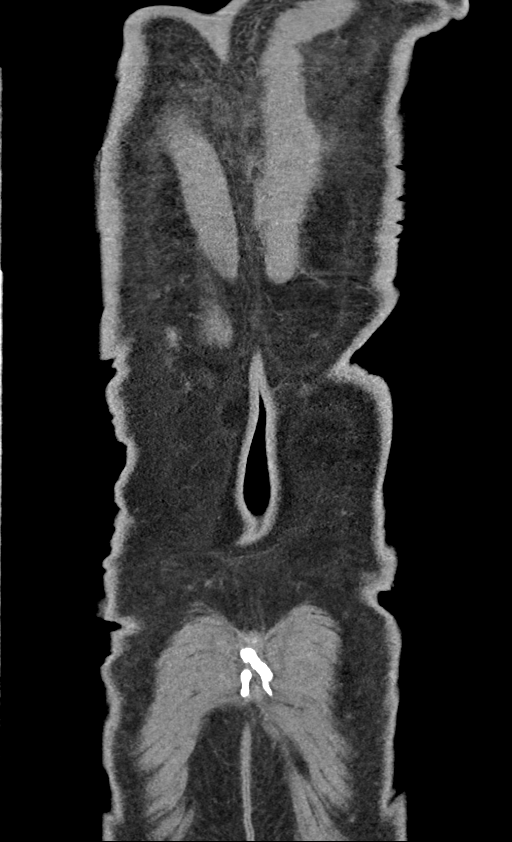

[13 of 46 positions shown; findings below may reference images not displayed]

FINDINGS: CTA CHEST FINDINGS

Cardiovascular: Normal branch pattern of the great vessels without
stenosis, dissection or aneurysm. Mild ectasia of the thoracic aorta
without aneurysm or dissection. Minimal aortic atherosclerosis at
the arch and descending aorta. Borderline stable cardiomegaly
without pericardial effusion. No acute pulmonary embolus. Minimal
coronary arteriosclerosis suggested along the RCA.

Mediastinum/Nodes: No enlarged mediastinal, hilar, or axillary lymph
nodes. Thyroid gland, trachea, and esophagus demonstrate no
significant findings.

Lungs/Pleura: Lungs are clear given motion degraded artifacts. No
pleural effusion or pneumothorax.

Musculoskeletal: No chest wall abnormality. No acute or significant
osseous findings.

Review of the MIP images confirms the above findings.

CTA ABDOMEN AND PELVIS FINDINGS

VASCULAR

Aorta: Normal caliber aorta without aneurysm, dissection, vasculitis
or significant stenosis.

Celiac: Patent without evidence of aneurysm, dissection, vasculitis
or significant stenosis.

SMA: Patent without evidence of aneurysm, dissection, vasculitis or
significant stenosis.

Renals: Both renal arteries are patent without evidence of aneurysm,
dissection, vasculitis, fibromuscular dysplasia or significant
stenosis.

IMA: Patent without evidence of aneurysm, dissection, vasculitis or
significant stenosis.

Inflow: Mild calcific atherosclerosis of the common iliac arteries
without significant luminal narrowing. Atherosclerosis of both
internal carotid arteries and branch vessels of without stenosis.

Veins: No obvious venous abnormality within the limitations of this
arterial phase study.

Review of the MIP images confirms the above findings.

NON-VASCULAR

Hepatobiliary: No space-occupying mass of the liver. There is no
biliary dilatation. The gallbladder is unremarkable.

Pancreas: Normal without enhancing mass or inflammation. Stable 4 mm
pancreatic ductal dilatation near the ampulla, unchanged. No ductal
stone.

Spleen: Normal size spleen. Heterogeneous enhancement likely due to
timing of contrast bolus.

Adrenals/Urinary Tract: Stable hyperdense nodule in the anterior
limb of the left adrenal gland measuring 1 cm, stable in appearance
from 2140 consistent with a benign finding. Symmetric cortical
enhancement of both kidneys without renal mass, nephrolithiasis nor
obstructive uropathy. Unremarkable bladder for the degree of
distention.

Stomach/Bowel: Normal appendix. Nondistended stomach with
unremarkable appearance of the small intestine. No large bowel
obstruction or inflammation.

Lymphatic: No lymphadenopathy by CT size criteria.

Reproductive: Enlarged prostate measuring 5.6 x 4.1 x 4.7 cm.

Other: No free air nor free fluid.

Musculoskeletal: No acute or significant osseous findings.

Review of the MIP images confirms the above findings.
IMPRESSION: 1. No thoracic nor abdominal aortic aneurysm or dissection.
2. No acute solid nor hollow visceral organ abnormality.
3. Minimal right coronary arteriosclerosis suggested.
4. Stable 1 cm hyperdense left adrenal nodule dating back to 2140
unchanged in size and appearance compatible with a benign finding.

## 2019-03-12 ENCOUNTER — Encounter: Payer: Medicare HMO | Admitting: Physical Therapy

## 2019-03-13 ENCOUNTER — Telehealth (HOSPITAL_COMMUNITY): Payer: Self-pay

## 2019-03-13 NOTE — Telephone Encounter (Signed)
I called pt to verify appointment today, he reports he is not home and would like to reschedule for tomor.   Marylouise Stacks, EMT-Paramedic 03/13/19

## 2019-03-14 ENCOUNTER — Encounter: Payer: Medicare HMO | Admitting: Rehabilitative and Restorative Service Providers"

## 2019-03-14 ENCOUNTER — Other Ambulatory Visit (HOSPITAL_COMMUNITY): Payer: Self-pay

## 2019-03-14 ENCOUNTER — Institutional Professional Consult (permissible substitution): Payer: Medicare HMO | Admitting: Internal Medicine

## 2019-03-14 NOTE — Progress Notes (Signed)
Paramedicine Encounter    Patient ID: Calvin [name], male    DOB: 14-Sep-1960, 58 y.o.   MRN: [id number]   [name], NP as PCP - General (Geriatric Medicine) [name], MD as PCP - Cardiology (Cardiology) [name], [name], MD as PCP - Advanced Heart Failure (Cardiology) [name], MD as Consulting Physician (Endocrinology)  Patient Active Problem List   Diagnosis Date Noted  . Foot pain, bilateral 02/01/2019  . Abdominal pain 01/23/2019  . Chest pain 01/23/2019  . Right leg pain 12/20/2018  . Overgrown toenails 12/20/2018  . Unsteady gait 12/20/2018  . Encounter for therapeutic drug monitoring 10/22/2018  . Nausea and vomiting 03/10/2018  . AF (paroxysmal atrial fibrillation) ([place]) 03/10/2018  . Non-cardiac chest pain 03/09/2018  . Diabetic neuropathy ([place]) 09/26/2017  . MCI (mild cognitive impairment) 06/23/2017  . OSA on CPAP 05/10/2017  . Gastroesophageal reflux disease 05/10/2017  . Insomnia 05/10/2017  . Onychomycosis of multiple toenails with type 2 diabetes mellitus and peripheral neuropathy ([place]) 05/10/2017  . Chronic systolic heart failure ([place]) 11/22/2015  . Essential hypertension 11/22/2015  . DM (diabetes mellitus) ([place]) 11/20/2015  . HLD (hyperlipidemia) 11/20/2015  . Hx of adenomatous colonic polyps 05/19/2011    Current Outpatient Medications:  .  amitriptyline (ELAVIL) 10 MG tablet, Take 1 tablet at bed time for one week and then increase 2 tablets at bed time, Disp: 60 tablet, Rfl: 3 .  apixaban (ELIQUIS) 5 MG TABS tablet, Take 5 mg by mouth 2 (two) times daily., Disp: , Rfl:  .  carvedilol (COREG) 6.25 MG tablet, Take 1 tablet (6.25 mg total) by mouth 2 (two) times daily with a meal., Disp: 180 tablet, Rfl: 3 .  donepezil (ARICEPT) 10 MG tablet, Take 1 tablet (10 mg total) by mouth at bedtime., Disp: 90 tablet, Rfl: 3 .  Evolocumab (REPATHA) 140 MG/ML SOSY, Inject into the skin every 14 (fourteen) days.,  Disp: , Rfl:  .  glucose blood (CONTOUR TEST) test strip, 1 each by Other route 2 (two) times a day., Disp: 200 each, Rfl: 3 .  Insulin Glargine, 2 Unit Dial, (TOUJEO MAX SOLOSTAR) 300 UNIT/ML SOPN, Inject 220 Units into the skin every morning., Disp: 25 pen, Rfl: 11 .  Insulin Syringe-Needle U-100 (INSULIN SYRINGE .3CC/29GX1/2") 29G X 1/2" 0.3 ML MISC, Inject 1 Syringe 3 (three) times daily as directed. Check blood sugar three times daily. Dx: E11.9, Disp: 100 each, Rfl: 3 .  lisinopril (PRINIVIL,ZESTRIL) 10 MG tablet, Take 1 tablet (10 mg total) by mouth at bedtime., Disp: 90 tablet, Rfl: 3 .  atorvastatin (LIPITOR) 80 MG tablet, Take 1 tablet (80 mg total) by mouth daily., Disp: 90 tablet, Rfl: 3 .  nitroGLYCERIN (NITROSTAT) 0.4 MG SL tablet, Place 1 tablet (0.4 mg total) under the tongue every 5 (five) minutes as needed for chest pain. (Patient not taking: Reported on 02/20/2019), Disp: 30 tablet, Rfl: 1 .  spironolactone (ALDACTONE) 25 MG tablet, Take 0.5 tablets (12.5 mg total) by mouth daily., Disp: 45 tablet, Rfl: 3 No Known Allergies    Social History   Socioeconomic History  . Marital status: Single    Spouse name: Not on file  . Number of children: 0  . Years of education: Not on file  . Highest education level: Not on file  Occupational History  . Occupation: Disability  Social Needs  . Financial resource strain: Not on file  . Food insecurity    Worry: Not on file  Inability: Not on file  . Transportation needs    Medical: Not on file    Non-medical: Not on file  Tobacco Use  . Smoking status: Never Smoker  . Smokeless tobacco: Never Used  Substance and Sexual Activity  . Alcohol use: No  . Drug use: No  . Sexual activity: Yes    Birth control/protection: None  Lifestyle  . Physical activity    Days per week: Not on file    Minutes per session: Not on file  . Stress: Not on file  Relationships  . Social Herbalist on phone: Not on file    Gets  together: Not on file    Attends religious service: Not on file    Active member of club or organization: Not on file    Attends meetings of clubs or organizations: Not on file    Relationship status: Not on file  . Intimate partner violence    Fear of current or ex partner: Not on file    Emotionally abused: Not on file    Physically abused: Not on file    Forced sexual activity: Not on file  Other Topics Concern  . Not on file  Social History Narrative   Social History   Diet?    Do you drink/eat things with caffeine? yes   Marital status?       single      Do you live in a house, apartment, assisted living, condo, trailer, etc.? yes   Is it one or more stories? One story   How many persons live in your home?   Do you have any pets in your home? (please list)    Highest level of education completed? graduate   Do you exercise?            no                          Type & how often?   Advanced Directives   Do you have a living will? no   Do you have a DNR form?                                  If not, do you want to discuss one? no   Do you have signed POA/HPOA for forms? no      Functional Status   Do you have difficulty bathing or dressing yourself? no   Do you have difficulty preparing food or eating? no   Do you have difficulty managing your medications? no   Do you have difficulty managing your finances? no   Do you have difficulty affording your medications? no    Physical Exam      Future Appointments  Date Time Provider [place]  04/04/2019  9:45 AM [name], MD [facility] None  04/22/2019  3:00 PM Marzetta Board, DPM [facility]  05/01/2019 11:00 AM [name], NP [facility] None    BP 140/88   Pulse 94   Temp 97.7 F (36.5 C)   Resp 16   Wt 165 lb (74.8 kg)   SpO2 99%   BMI 28.32 kg/m   Weight yesterday-165 Last visit weight-165 CBG PTA-447  Pt reports doing ok, no visit for the past few weeks due to him being  out of town.  He has been taking his meds just out  of the bottle. Its difficult for him to do the pill box due to his vision-but he does take them correctly.  meds verified and pill box filled for him today.  His CBG are still running very elevated. He has appoint with dr Loanne Drilling in the next couple weeks. He said he is taking insulin at morning and bedtime, but in epic its listed as morning only.  His memory isnt the best at times so im not sure exactly what he is doing with that. Will see him again next week.    Marylouise Stacks, [facility] Paramedic  03/14/19

## 2019-03-18 ENCOUNTER — Encounter: Payer: Medicare HMO | Admitting: Rehabilitative and Restorative Service Providers"

## 2019-03-19 ENCOUNTER — Encounter (INDEPENDENT_AMBULATORY_CARE_PROVIDER_SITE_OTHER): Payer: Self-pay | Admitting: *Deleted

## 2019-03-20 ENCOUNTER — Telehealth (HOSPITAL_COMMUNITY): Payer: Self-pay

## 2019-03-20 NOTE — Telephone Encounter (Signed)
I called pt to see about home visit today, he said there was a death in the family and if possible to reschedule our visit for tomorrow.   Marylouise Stacks, EMT-Paramedic  03/20/19

## 2019-03-21 ENCOUNTER — Other Ambulatory Visit (HOSPITAL_COMMUNITY): Payer: Self-pay

## 2019-03-21 NOTE — Progress Notes (Signed)
Paramedicine Encounter    Patient ID: Calvin [name], male    DOB: 1961-05-31, 58 y.o.   MRN: [id number]   [name], NP as PCP - General (Geriatric Medicine) [name], MD as PCP - Cardiology (Cardiology) [name], [name], MD as PCP - Advanced Heart Failure (Cardiology) [name], MD as Consulting Physician (Endocrinology)  Patient Active Problem List   Diagnosis Date Noted  . Foot pain, bilateral 02/01/2019  . Abdominal pain 01/23/2019  . Chest pain 01/23/2019  . Right leg pain 12/20/2018  . Overgrown toenails 12/20/2018  . Unsteady gait 12/20/2018  . Encounter for therapeutic drug monitoring 10/22/2018  . Nausea and vomiting 03/10/2018  . AF (paroxysmal atrial fibrillation) ([place]) 03/10/2018  . Non-cardiac chest pain 03/09/2018  . Diabetic neuropathy ([place]) 09/26/2017  . MCI (mild cognitive impairment) 06/23/2017  . OSA on CPAP 05/10/2017  . Gastroesophageal reflux disease 05/10/2017  . Insomnia 05/10/2017  . Onychomycosis of multiple toenails with type 2 diabetes mellitus and peripheral neuropathy ([place]) 05/10/2017  . Chronic systolic heart failure ([place]) 11/22/2015  . Essential hypertension 11/22/2015  . DM (diabetes mellitus) ([place]) 11/20/2015  . HLD (hyperlipidemia) 11/20/2015  . Hx of adenomatous colonic polyps 05/19/2011    Current Outpatient Medications:  .  amitriptyline (ELAVIL) 10 MG tablet, Take 1 tablet at bed time for one week and then increase 2 tablets at bed time, Disp: 60 tablet, Rfl: 3 .  apixaban (ELIQUIS) 5 MG TABS tablet, Take 5 mg by mouth 2 (two) times daily., Disp: , Rfl:  .  atorvastatin (LIPITOR) 80 MG tablet, Take 1 tablet (80 mg total) by mouth daily., Disp: 90 tablet, Rfl: 3 .  carvedilol (COREG) 6.25 MG tablet, Take 1 tablet (6.25 mg total) by mouth 2 (two) times daily with a meal., Disp: 180 tablet, Rfl: 3 .  donepezil (ARICEPT) 10 MG tablet, Take 1 tablet (10 mg total) by mouth at bedtime.,  Disp: 90 tablet, Rfl: 3 .  Evolocumab (REPATHA) 140 MG/ML SOSY, Inject into the skin every 14 (fourteen) days., Disp: , Rfl:  .  glucose blood (CONTOUR TEST) test strip, 1 each by Other route 2 (two) times a day., Disp: 200 each, Rfl: 3 .  Insulin Glargine, 2 Unit Dial, (TOUJEO MAX SOLOSTAR) 300 UNIT/ML SOPN, Inject 220 Units into the skin every morning., Disp: 25 pen, Rfl: 11 .  Insulin Syringe-Needle U-100 (INSULIN SYRINGE .3CC/29GX1/2") 29G X 1/2" 0.3 ML MISC, Inject 1 Syringe 3 (three) times daily as directed. Check blood sugar three times daily. Dx: E11.9, Disp: 100 each, Rfl: 3 .  lisinopril (PRINIVIL,ZESTRIL) 10 MG tablet, Take 1 tablet (10 mg total) by mouth at bedtime., Disp: 90 tablet, Rfl: 3 .  nitroGLYCERIN (NITROSTAT) 0.4 MG SL tablet, Place 1 tablet (0.4 mg total) under the tongue every 5 (five) minutes as needed for chest pain. (Patient not taking: Reported on 02/20/2019), Disp: 30 tablet, Rfl: 1 .  spironolactone (ALDACTONE) 25 MG tablet, Take 0.5 tablets (12.5 mg total) by mouth daily., Disp: 45 tablet, Rfl: 3 No Known Allergies    Social History   Socioeconomic History  . Marital status: Single    Spouse name: Not on file  . Number of children: 0  . Years of education: Not on file  . Highest education level: Not on file  Occupational History  . Occupation: Disability  Social Needs  . Financial resource strain: Not on file  . Food insecurity    Worry: Not on file  Inability: Not on file  . Transportation needs    Medical: Not on file    Non-medical: Not on file  Tobacco Use  . Smoking status: Never Smoker  . Smokeless tobacco: Never Used  Substance and Sexual Activity  . Alcohol use: No  . Drug use: No  . Sexual activity: Yes    Birth control/protection: None  Lifestyle  . Physical activity    Days per week: Not on file    Minutes per session: Not on file  . Stress: Not on file  Relationships  . Social Herbalist on phone: Not on file    Gets  together: Not on file    Attends religious service: Not on file    Active member of club or organization: Not on file    Attends meetings of clubs or organizations: Not on file    Relationship status: Not on file  . Intimate partner violence    Fear of current or ex partner: Not on file    Emotionally abused: Not on file    Physically abused: Not on file    Forced sexual activity: Not on file  Other Topics Concern  . Not on file  Social History Narrative   Social History   Diet?    Do you drink/eat things with caffeine? yes   Marital status?       single      Do you live in a house, apartment, assisted living, condo, trailer, etc.? yes   Is it one or more stories? One story   How many persons live in your home?   Do you have any pets in your home? (please list)    Highest level of education completed? graduate   Do you exercise?            no                          Type & how often?   Advanced Directives   Do you have a living will? no   Do you have a DNR form?                                  If not, do you want to discuss one? no   Do you have signed POA/HPOA for forms? no      Functional Status   Do you have difficulty bathing or dressing yourself? no   Do you have difficulty preparing food or eating? no   Do you have difficulty managing your medications? no   Do you have difficulty managing your finances? no   Do you have difficulty affording your medications? no    Physical Exam      Future Appointments  Date Time Provider [place]  04/04/2019  9:45 AM [name], MD [facility] None  04/22/2019  3:00 PM Marzetta Board, DPM [facility]  05/01/2019 11:00 AM [name], NP [facility] None    BP (!) 144/80   Pulse 100   Temp (!) 97.5 F (36.4 C)   Resp 16   Wt 165 lb (74.8 kg)   SpO2 98%   BMI 28.32 kg/m   Weight yesterday-165 Last visit weight-165  Pt reports he is feeling ok, he denies c/p, he still getting some h/a.  This week he has had a lot going on with family, there was  a death in his family and he has been coping with a family loss.  No missed doses of his meds.  meds verified and pill box refilled.  His CBG's are still staying in the 400s morning and night.  I advised him to contact his doctor to see if his appoint can be moved up. He is interested in the insulin pumps-I advised him to ask his doctor about it.  He denies sob, just feels tired this week.  No edema noted. Weight staying the same.   Marylouise Stacks, [facility] Paramedic  03/21/19

## 2019-03-25 ENCOUNTER — Other Ambulatory Visit: Payer: Self-pay

## 2019-03-25 DIAGNOSIS — E785 Hyperlipidemia, unspecified: Secondary | ICD-10-CM

## 2019-03-28 ENCOUNTER — Other Ambulatory Visit (HOSPITAL_COMMUNITY): Payer: Self-pay

## 2019-03-28 ENCOUNTER — Telehealth (HOSPITAL_COMMUNITY): Payer: Self-pay

## 2019-03-28 NOTE — Progress Notes (Signed)
I spoke to pt earlier in the day regarding time of appointment and he said he would be home around 5-530 for a home visit.  When I arrived to pts home I knocked but he did not answer the door, I called on phone and he did not answer either.  He has done this before in the past so not unusual.  Will f/u next week.   Marylouise Stacks, EMT-Paramedic  03/28/19

## 2019-03-28 NOTE — Telephone Encounter (Signed)
Pt called me back stating he was upstairs asleep and just woke up to see my missed call.  He will be able to take his meds on his own without issues.  Pt will be seen next week.   Marylouise Stacks, EMT-Paramedic  03/28/19

## 2019-04-02 ENCOUNTER — Other Ambulatory Visit: Payer: Self-pay

## 2019-04-04 ENCOUNTER — Other Ambulatory Visit: Payer: Self-pay

## 2019-04-04 ENCOUNTER — Encounter: Payer: Self-pay | Admitting: Endocrinology

## 2019-04-04 ENCOUNTER — Ambulatory Visit (INDEPENDENT_AMBULATORY_CARE_PROVIDER_SITE_OTHER): Payer: Medicare HMO | Admitting: Endocrinology

## 2019-04-04 ENCOUNTER — Telehealth (HOSPITAL_COMMUNITY): Payer: Self-pay

## 2019-04-04 VITALS — BP 112/68 | HR 86 | Ht 64.0 in | Wt 162.8 lb

## 2019-04-04 DIAGNOSIS — E1140 Type 2 diabetes mellitus with diabetic neuropathy, unspecified: Secondary | ICD-10-CM | POA: Diagnosis not present

## 2019-04-04 DIAGNOSIS — Z794 Long term (current) use of insulin: Secondary | ICD-10-CM

## 2019-04-04 LAB — POCT GLYCOSYLATED HEMOGLOBIN (HGB A1C): Hemoglobin A1C: 11 % — AB (ref 4.0–5.6)

## 2019-04-04 MED ORDER — TRESIBA FLEXTOUCH 200 UNIT/ML ~~LOC~~ SOPN: 220.0000 [IU] | PEN_INJECTOR | Freq: Every day | SUBCUTANEOUS | 3 refills | Status: DC

## 2019-04-04 NOTE — Progress Notes (Signed)
Subjective:    Patient ID: Calvin [name], male    DOB: [birth year], 58 y.o.   MRN: [id number]  HPI Pt returns for f/u of diabetes mellitus: DM type: Insulin-requiring type 2. Dx'ed: 7408 Complications: polyneuropathy, CAD, renal insuff, and DR.   Therapy: insulin since soon after dx.  DKA: never Severe hypoglycemia: never.   Pancreatitis: never Pancreatic imaging: normal on 2003 CT. Other: he takes human insulin, due to cost; due to noncompliance, he is not a candidate for multiple daily injections; he is retired.     Interval history: he does not check cbg's  Pt says he never misses the insulin.   Past Medical History:  Diagnosis Date  . Adenoidal hypertrophy   . Adenomatous colon polyps 2012  . Arthritis   . Diabetes mellitus   . GERD (gastroesophageal reflux disease)   . Heart attack ([place])    [place] in [place]  [name] Hx of adenomatous colonic polyps 08/18/2017  . Hyperlipidemia   . Hypertension   . Mild CAD    a. cath in 08/2017 showing mild nonobstructive CAD with scattered 20-30% stenosis.   . Nonischemic cardiomyopathy ([place])    a. EF 20-25% by echo in 09/2017 with cath showing mild CAD. b.  Last echo 12/2017 EF 35-40%, grade 2 DD.  [name] Obesity   . PAF (paroxysmal atrial fibrillation) ([place])   . Sleep apnea    cpap, pt says no longer has    Past Surgical History:  Procedure Laterality Date  . COLONOSCOPY  05/13/11   9 adenomas  . FOREARM SURGERY    . MUSCLE BIOPSY    . RIGHT/LEFT HEART CATH AND CORONARY ANGIOGRAPHY N/A 08/25/2017   Procedure: RIGHT/LEFT HEART CATH AND CORONARY ANGIOGRAPHY;  Surgeon: [name], MD;  Location: [facility];  Service: Cardiovascular;  Laterality: N/A;    Social History   Socioeconomic History  . Marital status: Single    Spouse name: Not on file  . Number of children: 0  . Years of education: Not on file  . Highest education level: Not on file  Occupational History  . Occupation: Disability  Social Needs   . Financial resource strain: Not on file  . Food insecurity    Worry: Not on file    Inability: Not on file  . Transportation needs    Medical: Not on file    Non-medical: Not on file  Tobacco Use  . Smoking status: Never Smoker  . Smokeless tobacco: Never Used  Substance and Sexual Activity  . Alcohol use: No  . Drug use: No  . Sexual activity: Yes    Birth control/protection: None  Lifestyle  . Physical activity    Days per week: Not on file    Minutes per session: Not on file  . Stress: Not on file  Relationships  . Social Herbalist on phone: Not on file    Gets together: Not on file    Attends religious service: Not on file    Active member of club or organization: Not on file    Attends meetings of clubs or organizations: Not on file    Relationship status: Not on file  . Intimate partner violence    Fear of current or ex partner: Not on file    Emotionally abused: Not on file    Physically abused: Not on file    Forced sexual activity: Not on file  Other Topics Concern  . Not on  file  Social History Narrative   Social History   Diet?    Do you drink/eat things with caffeine? yes   Marital status?       single      Do you live in a house, apartment, assisted living, condo, trailer, etc.? yes   Is it one or more stories? One story   How many persons live in your home?   Do you have any pets in your home? (please list)    Highest level of education completed? graduate   Do you exercise?            no                          Type & how often?   Advanced Directives   Do you have a living will? no   Do you have a DNR form?                                  If not, do you want to discuss one? no   Do you have signed POA/HPOA for forms? no      Functional Status   Do you have difficulty bathing or dressing yourself? no   Do you have difficulty preparing food or eating? no   Do you have difficulty managing your medications? no   Do you have difficulty  managing your finances? no   Do you have difficulty affording your medications? no    Current Outpatient Medications on File Prior to Visit  Medication Sig Dispense Refill  . amitriptyline (ELAVIL) 10 MG tablet Take 1 tablet at bed time for one week and then increase 2 tablets at bed time 60 tablet 3  . apixaban (ELIQUIS) 5 MG TABS tablet Take 5 mg by mouth 2 (two) times daily.    . carvedilol (COREG) 6.25 MG tablet Take 1 tablet (6.25 mg total) by mouth 2 (two) times daily with a meal. 180 tablet 3  . donepezil (ARICEPT) 10 MG tablet Take 1 tablet (10 mg total) by mouth at bedtime. 90 tablet 3  . Evolocumab (REPATHA) 140 MG/ML SOSY Inject into the skin every 14 (fourteen) days.    [name] glucose blood (CONTOUR TEST) test strip 1 each by Other route 2 (two) times a day. 200 each 3  . Insulin Syringe-Needle U-100 (INSULIN SYRINGE .3CC/29GX1/2") 29G X 1/2" 0.3 ML MISC Inject 1 Syringe 3 (three) times daily as directed. Check blood sugar three times daily. Dx: E11.9 100 each 3  . lisinopril (PRINIVIL,ZESTRIL) 10 MG tablet Take 1 tablet (10 mg total) by mouth at bedtime. 90 tablet 3  . nitroGLYCERIN (NITROSTAT) 0.4 MG SL tablet Place 1 tablet (0.4 mg total) under the tongue every 5 (five) minutes as needed for chest pain. 30 tablet 1  . atorvastatin (LIPITOR) 80 MG tablet Take 1 tablet (80 mg total) by mouth daily. 90 tablet 3  . spironolactone (ALDACTONE) 25 MG tablet Take 0.5 tablets (12.5 mg total) by mouth daily. 45 tablet 3   No current facility-administered medications on file prior to visit.     No Known Allergies  Family History  Problem Relation Age of Onset  . Heart disease Mother   . Heart attack Mother 53  . Hypertension Mother   . Hyperlipidemia Mother   . Diabetes Mother   . Heart disease Father   . Heart attack  Father 6  . Hypertension Father   . Hyperlipidemia Father   . Diabetes Father   . Heart attack Sister 72  . Colon cancer Neg Hx   . Stomach cancer Neg Hx   .  Esophageal cancer Neg Hx   . Rectal cancer Neg Hx     BP 112/68 (BP Location: Left Arm, Patient Position: Sitting, Cuff Size: Normal)   Pulse 86   Ht 5' 4"  (1.626 m)   Wt 162 lb 12.8 oz (73.8 kg)   SpO2 99%   BMI 27.94 kg/m    Review of Systems He .denies hypoglycemia.      Objective:   Physical Exam VITAL SIGNS:  See vs page GENERAL: no distress Pulses: dorsalis pedis intact bilat.   MSK: no deformity of the feet CV: no leg edema Skin:  no ulcer on the feet.  normal color and temp on the feet.   Neuro: sensation is intact to touch on the feet.   Ext: there is bilateral onychomycosis of the toenails.    Lab Results  Component Value Date   HGBA1C 11.0 (A) 04/04/2019       Assessment & Plan:  Insulin-requiring type 2 DM, with renal insuff: worse Noncompliance with cbg recording: he needs the simplest possible med regimen.  tresiba will give dosing flexibility   Patient Instructions  I have sent a prescription to your [facility], to change the Lantus to [place], 220 units daily.  This is a very slow time-release insulin.  If you miss it, you can make it up when you remember.   check your blood sugar twice a day.  vary the time of day when you check, between before the 3 meals, and at bedtime.  also check if you have symptoms of your blood sugar being too high or too low.  please keep a record of the readings and bring it to your next appointment here (or you can bring the meter itself).  You can write it on any piece of paper.  please call us sooner if your blood sugar goes below 70, or if you have a lot of readings over 200. Please come back for a follow-up appointment in 2 months.

## 2019-04-04 NOTE — Patient Instructions (Addendum)
I have sent a prescription to your [facility], to change the Lantus to [place], 220 units daily.  This is a very slow time-release insulin.  If you miss it, you can make it up when you remember.   check your blood sugar twice a day.  vary the time of day when you check, between before the 3 meals, and at bedtime.  also check if you have symptoms of your blood sugar being too high or too low.  please keep a record of the readings and bring it to your next appointment here (or you can bring the meter itself).  You can write it on any piece of paper.  please call us sooner if your blood sugar goes below 70, or if you have a lot of readings over 200. Please come back for a follow-up appointment in 2 months.

## 2019-04-04 NOTE — Telephone Encounter (Signed)
Pt reports he is still at doc office at this time.  He will not be able to meet this evening.  He states he will fill his pill box himself and I will see him next week.   Marylouise Stacks, EMT-Paramedic  04/04/19

## 2019-04-08 ENCOUNTER — Other Ambulatory Visit: Payer: Self-pay

## 2019-04-08 ENCOUNTER — Other Ambulatory Visit: Payer: Medicare HMO | Admitting: *Deleted

## 2019-04-08 DIAGNOSIS — E785 Hyperlipidemia, unspecified: Secondary | ICD-10-CM

## 2019-04-08 LAB — LIPID PANEL
Chol/HDL Ratio: 2.7 ratio (ref 0.0–5.0)
Cholesterol, Total: 152 mg/dL (ref 100–199)
HDL: 57 mg/dL (ref >39)
LDL Chol Calc (NIH): 81 mg/dL (ref 0–99)
Triglycerides: 70 mg/dL (ref 0–149)
VLDL Cholesterol Cal: 14 mg/dL (ref 5–40)

## 2019-04-08 LAB — HEPATIC FUNCTION PANEL
ALT: 14 IU/L (ref 0–44)
AST: 14 IU/L (ref 0–40)
Albumin: 4.3 g/dL (ref 3.8–4.9)
Alkaline Phosphatase: 88 IU/L (ref 39–117)
Bilirubin Total: 0.4 mg/dL (ref 0.0–1.2)
Bilirubin, Direct: 0.09 mg/dL (ref 0.00–0.40)
Total Protein: 6.9 g/dL (ref 6.0–8.5)

## 2019-04-11 ENCOUNTER — Other Ambulatory Visit (HOSPITAL_COMMUNITY): Payer: Self-pay

## 2019-04-11 ENCOUNTER — Telehealth: Payer: Self-pay | Admitting: *Deleted

## 2019-04-11 ENCOUNTER — Other Ambulatory Visit: Payer: Self-pay | Admitting: Neurology

## 2019-04-11 MED ORDER — EZETIMIBE 10 MG PO TABS: 10.0000 mg | ORAL_TABLET | Freq: Every day | ORAL | 3 refills | Status: DC

## 2019-04-11 NOTE — Telephone Encounter (Signed)
Pt notified of lab results and recommendations.  Will send prescription to [facility] mail order pharmacy

## 2019-04-11 NOTE — Telephone Encounter (Signed)
-----   Message from Leeroy Bock, [facility] sent at 04/09/2019  7:39 AM EDT ----- LDL improved from 177 on atorvastatin 85m daily to 81 after adding Repatha 1439mQ2W. LDL still remains slightly above goal < 70. Recommend adding Zetia 1012maily and continuing atorvastatin and Repatha.

## 2019-04-11 NOTE — Progress Notes (Signed)
Paramedicine Encounter    Patient ID: Calvin [name], male    DOB: [birth year], 59 y.o.   MRN: [id number]   [name], NP as PCP - General (Geriatric Medicine) [name], MD as PCP - Cardiology (Cardiology) [name], [name], MD as PCP - Advanced Heart Failure (Cardiology) [name], MD as Consulting Physician (Endocrinology)  Patient Active Problem List   Diagnosis Date Noted  . Foot pain, bilateral 02/01/2019  . Abdominal pain 01/23/2019  . Chest pain 01/23/2019  . Right leg pain 12/20/2018  . Overgrown toenails 12/20/2018  . Unsteady gait 12/20/2018  . Encounter for therapeutic drug monitoring 10/22/2018  . Nausea and vomiting 03/10/2018  . AF (paroxysmal atrial fibrillation) ([place]) 03/10/2018  . Non-cardiac chest pain 03/09/2018  . Diabetic neuropathy ([place]) 09/26/2017  . MCI (mild cognitive impairment) 06/23/2017  . OSA on CPAP 05/10/2017  . Gastroesophageal reflux disease 05/10/2017  . Insomnia 05/10/2017  . Onychomycosis of multiple toenails with type 2 diabetes mellitus and peripheral neuropathy ([place]) 05/10/2017  . Chronic systolic heart failure ([place]) 11/22/2015  . Essential hypertension 11/22/2015  . DM (diabetes mellitus) ([place]) 11/20/2015  . HLD (hyperlipidemia) 11/20/2015  . Hx of adenomatous colonic polyps 05/19/2011    Current Outpatient Medications:  .  amitriptyline (ELAVIL) 10 MG tablet, Take 1 tablet at bed time for one week and then increase 2 tablets at bed time, Disp: 60 tablet, Rfl: 3 .  apixaban (ELIQUIS) 5 MG TABS tablet, Take 5 mg by mouth 2 (two) times daily., Disp: , Rfl:  .  carvedilol (COREG) 6.25 MG tablet, Take 1 tablet (6.25 mg total) by mouth 2 (two) times daily with a meal., Disp: 180 tablet, Rfl: 3 .  donepezil (ARICEPT) 10 MG tablet, Take 1 tablet (10 mg total) by mouth at bedtime., Disp: 90 tablet, Rfl: 3 .  Evolocumab (REPATHA) 140 MG/ML SOSY, Inject into the skin every 14 (fourteen) days.,  Disp: , Rfl:  .  glucose blood (CONTOUR TEST) test strip, 1 each by Other route 2 (two) times a day., Disp: 200 each, Rfl: 3 .  Insulin Degludec (TRESIBA FLEXTOUCH) 200 UNIT/ML SOPN, Inject 220 Units into the skin daily. And pen needles 2/day, Disp: 36 pen, Rfl: 3 .  Insulin Syringe-Needle U-100 (INSULIN SYRINGE .3CC/29GX1/2") 29G X 1/2" 0.3 ML MISC, Inject 1 Syringe 3 (three) times daily as directed. Check blood sugar three times daily. Dx: E11.9, Disp: 100 each, Rfl: 3 .  lisinopril (PRINIVIL,ZESTRIL) 10 MG tablet, Take 1 tablet (10 mg total) by mouth at bedtime., Disp: 90 tablet, Rfl: 3 .  atorvastatin (LIPITOR) 80 MG tablet, Take 1 tablet (80 mg total) by mouth daily., Disp: 90 tablet, Rfl: 3 .  ezetimibe (ZETIA) 10 MG tablet, Take 1 tablet (10 mg total) by mouth daily. (Patient not taking: Reported on 04/11/2019), Disp: 90 tablet, Rfl: 3 .  nitroGLYCERIN (NITROSTAT) 0.4 MG SL tablet, Place 1 tablet (0.4 mg total) under the tongue every 5 (five) minutes as needed for chest pain. (Patient not taking: Reported on 04/11/2019), Disp: 30 tablet, Rfl: 1 .  spironolactone (ALDACTONE) 25 MG tablet, Take 0.5 tablets (12.5 mg total) by mouth daily., Disp: 45 tablet, Rfl: 3 No Known Allergies    Social History   Socioeconomic History  . Marital status: Single    Spouse name: Not on file  . Number of children: 0  . Years of education: Not on file  . Highest education level: Not on file  Occupational History  .  Occupation: Disability  Social Needs  . Financial resource strain: Not on file  . Food insecurity    Worry: Not on file    Inability: Not on file  . Transportation needs    Medical: Not on file    Non-medical: Not on file  Tobacco Use  . Smoking status: Never Smoker  . Smokeless tobacco: Never Used  Substance and Sexual Activity  . Alcohol use: No  . Drug use: No  . Sexual activity: Yes    Birth control/protection: None  Lifestyle  . Physical activity    Days per week: Not on  file    Minutes per session: Not on file  . Stress: Not on file  Relationships  . Social Herbalist on phone: Not on file    Gets together: Not on file    Attends religious service: Not on file    Active member of club or organization: Not on file    Attends meetings of clubs or organizations: Not on file    Relationship status: Not on file  . Intimate partner violence    Fear of current or ex partner: Not on file    Emotionally abused: Not on file    Physically abused: Not on file    Forced sexual activity: Not on file  Other Topics Concern  . Not on file  Social History Narrative   Social History   Diet?    Do you drink/eat things with caffeine? yes   Marital status?       single      Do you live in a house, apartment, assisted living, condo, trailer, etc.? yes   Is it one or more stories? One story   How many persons live in your home?   Do you have any pets in your home? (please list)    Highest level of education completed? graduate   Do you exercise?            no                          Type & how often?   Advanced Directives   Do you have a living will? no   Do you have a DNR form?                                  If not, do you want to discuss one? no   Do you have signed POA/HPOA for forms? no      Functional Status   Do you have difficulty bathing or dressing yourself? no   Do you have difficulty preparing food or eating? no   Do you have difficulty managing your medications? no   Do you have difficulty managing your finances? no   Do you have difficulty affording your medications? no    Physical Exam      Future Appointments  Date Time Provider [place]  04/22/2019  3:00 PM Marzetta Board, DPM [facility]  05/01/2019 11:00 AM [name], NP [facility] None  06/06/2019 10:00 AM [name], MD [facility] None    BP 126/70   Pulse 88   Temp 97.8 F (36.6 C)   Resp 16   Wt 165 lb (74.8 kg)   SpO2 99%   BMI  28.32 kg/m  CBG PTA-400 Weight yesterday-165 Last visit weight-165  Pt reports  he is doing ok, he has h/a, but he gets h/a often. He is being prescribed zetia and a different type of insulin which was sent to [place] so he hasnt gotten it yet to begin those.  He denies increased sob. He just feels tired a lot. Not sleeping too well at night. He does not have great appetite.  He denies c/p, no dizziness.  Ordered refills for-lisinopril, amitriptyline, donepezil, and eliquis.   Marylouise Stacks, [facility] Paramedic  04/11/19

## 2019-04-22 ENCOUNTER — Ambulatory Visit: Payer: Medicare HMO | Admitting: Podiatry

## 2019-04-25 ENCOUNTER — Telehealth (HOSPITAL_COMMUNITY): Payer: Self-pay

## 2019-04-25 NOTE — Telephone Encounter (Signed)
Called pt today to see when he would be home this evening for a home visit, no answer-he did send me a text and said he would call me right back, but have not heard back from him yet.   He is capable of filling pill box. Will f/u next week.  Marylouise Stacks, EMT-Paramedic  04/25/19

## 2019-05-01 ENCOUNTER — Other Ambulatory Visit: Payer: Self-pay

## 2019-05-01 ENCOUNTER — Ambulatory Visit (INDEPENDENT_AMBULATORY_CARE_PROVIDER_SITE_OTHER): Payer: Medicare HMO | Admitting: Nurse Practitioner

## 2019-05-01 ENCOUNTER — Encounter: Payer: Self-pay | Admitting: Nurse Practitioner

## 2019-05-01 DIAGNOSIS — E1140 Type 2 diabetes mellitus with diabetic neuropathy, unspecified: Secondary | ICD-10-CM

## 2019-05-01 DIAGNOSIS — R2681 Unsteadiness on feet: Secondary | ICD-10-CM | POA: Diagnosis not present

## 2019-05-01 DIAGNOSIS — G3184 Mild cognitive impairment, so stated: Secondary | ICD-10-CM

## 2019-05-01 DIAGNOSIS — I10 Essential (primary) hypertension: Secondary | ICD-10-CM

## 2019-05-01 DIAGNOSIS — Z1159 Encounter for screening for other viral diseases: Secondary | ICD-10-CM

## 2019-05-01 DIAGNOSIS — Z794 Long term (current) use of insulin: Secondary | ICD-10-CM | POA: Diagnosis not present

## 2019-05-01 DIAGNOSIS — E785 Hyperlipidemia, unspecified: Secondary | ICD-10-CM

## 2019-05-01 DIAGNOSIS — I5022 Chronic systolic (congestive) heart failure: Secondary | ICD-10-CM | POA: Diagnosis not present

## 2019-05-01 NOTE — Progress Notes (Signed)
This service is provided via telemedicine  No vital signs collected/recorded due to the encounter was a telemedicine visit.   Location of patient (ex: home, work):  Out and about  Patient consents to a telephone visit:  Yes  Location of the provider (ex: office, home):  [facility], Office   Name of any referring provider:  N/A  Names of all persons participating in the telemedicine service and their role in the encounter: S.Chrae B/CMA, Sherrie Mustache, NP, and Patient   Time spent on call:  8 min with medical assistant       Careteam: [name], NP as PCP - General (Geriatric Medicine) [name], MD as PCP - Cardiology (Cardiology) [name], [name], MD as PCP - Advanced Heart Failure (Cardiology) [name], MD as Consulting Physician (Endocrinology)  Advanced Directive information Does Patient Have a Medical Advance Directive?: No, Would patient like information on creating a medical advance directive?: Yes (MAU/Ambulatory/Procedural Areas - Information given)  No Known Allergies  Chief Complaint  Patient presents with  . Medical Management of Chronic Issues    3 month follow-up. Patient c/o balance and gait issues, patient with frequent falls and tripping.Patient also c/o sleep issues. Telephone visit   . Quality Metric Gaps    Discuss need for PNA, patient will schedule nurse visit for Flu vaccine   . Best Practice Recommendations    Hep C screening recommended    HPI: Patient is a 58 y.o. male via tele-visit originally scheduled for in office visit but forgot about appt States his memory comes and goes.  Needs flu and pneumococcal vaccine  DM- states he never got [place] - states he got the needles but never got the pens. Unsure what dose of the lantus he is taking- memory is poor- has it written down at home.  Blood sugar running around 400 and does not have an appetite. Has next follow up in  December.   Hyperlipidemia- continues on zetia and repatha- a nurse fills his pill pack he is unsure what he actually takes.   Hypertension- blood pressure checked by nurse, he is unsure what this is. Nurse writes it down for him but he does not have readings.   CHF- followed by CHF clinic, without worsening of shortness of breath, cough or congestion.   Abnormal gait- went to physical therapy but could not afford co-pay. Attempts to do exercises that were recommended at first visit.   MCI- memory deficit, he writes a lot of things down and does not have that in front of him today for the visit. Forgot he had in office visit. Continues to follow up with neurologist.   Headache- continues on amitriptyline  Review of Systems:  Review of Systems  Constitutional: Negative for chills, fever and weight loss.  HENT: Negative for tinnitus.   Respiratory: Negative for cough, sputum production and shortness of breath.   Cardiovascular: Negative for chest pain, palpitations and leg swelling.  Gastrointestinal: Negative for abdominal pain, constipation, diarrhea and heartburn.  Genitourinary: Negative for dysuria, frequency and urgency.  Musculoskeletal: Negative for back pain, falls, joint pain and myalgias.  Skin: Negative.   Neurological: Negative for dizziness and headaches.  Psychiatric/Behavioral: Positive for memory loss. Negative for depression. The patient does not have insomnia.     Past Medical History:  Diagnosis Date  . Adenoidal hypertrophy   . Adenomatous colon polyps 2012  . Arthritis   . Diabetes mellitus   .  GERD (gastroesophageal reflux disease)   . Heart attack ([place])    [place] living in [place]  [name] Hx of adenomatous colonic polyps 08/18/2017  . Hyperlipidemia   . Hypertension   . Mild CAD    a. cath in 08/2017 showing mild nonobstructive CAD with scattered 20-30% stenosis.   . Nonischemic cardiomyopathy ([place])    a. EF 20-25% by echo in 09/2017 with cath showing mild CAD. b.   Last echo 12/2017 EF 35-40%, grade 2 DD.  [name] Obesity   . PAF (paroxysmal atrial fibrillation) ([place])   . Sleep apnea    cpap, pt says no longer has   Past Surgical History:  Procedure Laterality Date  . COLONOSCOPY  05/13/11   9 adenomas  . FOREARM SURGERY    . MUSCLE BIOPSY    . RIGHT/LEFT HEART CATH AND CORONARY ANGIOGRAPHY N/A 08/25/2017   Procedure: RIGHT/LEFT HEART CATH AND CORONARY ANGIOGRAPHY;  Surgeon: [name], MD;  Location: [facility];  Service: Cardiovascular;  Laterality: N/A;   Social History:   reports that he has never smoked. He has never used smokeless tobacco. He reports that he does not drink alcohol or use drugs.  Family History  Problem Relation Age of Onset  . Heart disease Mother   . Heart attack Mother 42  . Hypertension Mother   . Hyperlipidemia Mother   . Diabetes Mother   . Heart disease Father   . Heart attack Father 41  . Hypertension Father   . Hyperlipidemia Father   . Diabetes Father   . Heart attack Sister 27  . Colon cancer Neg Hx   . Stomach cancer Neg Hx   . Esophageal cancer Neg Hx   . Rectal cancer Neg Hx     Medications: Patient's Medications  New Prescriptions   No medications on file  Previous Medications   AMITRIPTYLINE (ELAVIL) 10 MG TABLET    Take 1 tablet at bed time for one week and then increase 2 tablets at bed time   APIXABAN (ELIQUIS) 5 MG TABS TABLET    Take 5 mg by mouth 2 (two) times daily.   CARVEDILOL (COREG) 6.25 MG TABLET    Take 1 tablet (6.25 mg total) by mouth 2 (two) times daily with a meal.   DONEPEZIL (ARICEPT) 10 MG TABLET    Take 1 tablet (10 mg total) by mouth at bedtime.   EVOLOCUMAB (REPATHA) 140 MG/ML SOSY    Inject into the skin every 14 (fourteen) days.   EZETIMIBE (ZETIA) 10 MG TABLET    Take 1 tablet (10 mg total) by mouth daily.   GLUCOSE BLOOD (CONTOUR TEST) TEST STRIP    1 each by Other route 2 (two) times a day.   INSULIN DEGLUDEC (TRESIBA FLEXTOUCH) 200 UNIT/ML SOPN     Inject 220 Units into the skin daily. And pen needles 2/day   INSULIN SYRINGE-NEEDLE U-100 (INSULIN SYRINGE .3CC/29GX1/2") 29G X 1/2" 0.3 ML MISC    Inject 1 Syringe 3 (three) times daily as directed. Check blood sugar three times daily. Dx: E11.9   LISINOPRIL (PRINIVIL,ZESTRIL) 10 MG TABLET    Take 1 tablet (10 mg total) by mouth at bedtime.   NITROGLYCERIN (NITROSTAT) 0.4 MG SL TABLET    Place 1 tablet (0.4 mg total) under the tongue every 5 (five) minutes as needed for chest pain.  Modified Medications   No medications on file  Discontinued Medications   ATORVASTATIN (LIPITOR) 80 MG TABLET    Take  1 tablet (80 mg total) by mouth daily.   SPIRONOLACTONE (ALDACTONE) 25 MG TABLET    Take 0.5 tablets (12.5 mg total) by mouth daily.    Physical Exam:  There were no vitals filed for this visit. There is no height or weight on file to calculate BMI. Wt Readings from Last 3 Encounters:  04/11/19 165 lb (74.8 kg)  04/04/19 162 lb 12.8 oz (73.8 kg)  03/21/19 165 lb (74.8 kg)      Labs reviewed: Basic Metabolic Panel: Recent Labs    01/23/19 1253 01/24/19 1142 01/30/19 1158  NA 135 138 138  K 3.7 3.7 4.8  CL 103 105 102  CO2 19* 23 31  GLUCOSE 424* 211* 239*  BUN _0 CREATININE 1.45* 1.24 1.32  CALCIUM 8.7* 8.7* 9.9   Liver Function Tests: Recent Labs    06/15/18 1033 01/23/19 1500 04/08/19 0801  AST _1 ALT _2 ALKPHOS 82 63 88  BILITOT 0.3 0.5 0.4  PROT 6.3 6.4* 6.9  ALBUMIN 4.2 3.5 4.3   Recent Labs    01/23/19 1500  LIPASE 31   No results for input(s): AMMONIA in the last 8760 hours. CBC: Recent Labs    07/16/18 1928 01/23/19 1253 01/30/19 1158  WBC 6.7 11.5* 5.6  NEUTROABS  --   --  3,136  HGB 15.4 16.0 15.0  HCT 45.6 46.8 44.9  MCV 89.9 90.9 91.6  PLT 222 255 236   Lipid Panel: Recent Labs    06/15/18 1033 04/08/19 0801  CHOL 244* 152  HDL 53 57  LDLCALC 177* 81  TRIG 72 70  CHOLHDL 4.6 2.7   TSH: No results for  input(s): TSH in the last 8760 hours. A1C: Lab Results  Component Value Date   HGBA1C 11.0 (A) 04/04/2019     Assessment/Plan 1. Unsteady gait -ongoing. PT was ordered but did not go due to not being able to pay for co-pays.  - AMB Referral to [facility]   2. MCI (mild cognitive impairment) -ongoing making visit hard due to not remember important information regarding appts and medications.  - AMB Referral to [place] [facility]  3. Essential hypertension -does not have blood pressure readings for todays visit. Will continue current regimen at this time. Encouraged to notify if blood pressure not at goal. sbp <140/90.  - AMB Referral to [facility]  4. Type 2 diabetes mellitus with diabetic neuropathy, with long-term current use of insulin ([place]) -reports blood sugars remain high, he has not started tresiba yet, encouraged to reach out to his pharmacy to see what they delay is, this medication was sent in.  - AMB Referral to [place] [facility] - BMP with eGFR([facility]); Future - CBC with Differential/Platelet; Future  5. Hyperlipidemia, unspecified hyperlipidemia type -unsure if he is taking zetia but has someone filling his pill box -encouraged dietary modifications.  - AMB Referral to [facility]  6. Chronic systolic heart failure (HCC) -stable continues to follow up with heart failure clinic - AMB Referral to [place] [facility] - BMP with eGFR([facility]); Future - CBC with Differential/Platelet; Future  7. Need for hepatitis C screening test - Hepatitis C antibody; Future   Tarus Briski K. Harle Battiest  [facility] [phone]    Virtual Visit via Telephone Note  I connected with pt on 05/02/19 at 11:00 AM EDT by telephone and verified that I am speaking with the correct person  using two identifiers.  Location: Patient: out and about Provider: office    I discussed the limitations, risks, security and privacy  concerns of performing an evaluation and management service by telephone and the availability of in person appointments. I also discussed with the patient that there may be a patient responsible charge related to this service. The patient expressed understanding and agreed to proceed.   I discussed the assessment and treatment plan with the patient. The patient was provided an opportunity to ask questions and all were answered. The patient agreed with the plan and demonstrated an understanding of the instructions.   The patient was advised to call back or seek an in-person evaluation if the symptoms worsen or if the condition fails to improve as anticipated.  I provided 17 minutes of non-face-to-face time during this encounter.  Carlos American. Harle Battiest Avs printed and mailed

## 2019-05-01 NOTE — Patient Outreach (Signed)
[facility]) Care Management  05/01/2019  Calvin [name] 02/04/1961 [id number]   Initial Assessment   Telephone Screen  Referral Date: 05/01/2019 Referral Source: MD Office Referral Reason:unable to afford therapies/meds, doesn't know meds he's taking, DM,HF" Insurance: [employer]   Outreach attempt # 1 to patient. Spoke with patient who reports he is "out and about." Patient voices that he does not like to sit at home much and has been out a lot lately. RN CM reviewed with patient COVID-19 safety guidelines given that patient is in high risk group. Patient voiced understanding and reported he was adhering to safety guidelines. He states that he lives alone but he is trying to get approval at his apartment complex for his niece to move in with him to assist with his care needs. He voices that he is independent with ADLs/IADLs. He drives himself to MD appts or either catches a bus. He denies any recent falls. However, he voices that he "trips a lot and catches" himself. He has cane and walker in the home but does not use them and unable to provide reason.    Conditions: Per chart review, patient has PMH that includes but not limited to arthritis, DM(A1C 11.0-Oct 2020), GERD,MI, HLD, HTN,CAD, cardiomyopathy, obesity, PAF, sleep apnea and CHF. He states that he is monitoring cbgs in the home 2x/day. He reports for the past several months his cbgs have been in the 400s (per pt-MD aware). He complains of decreased appetite and only eating about 2x/day. However, he admits to drinking a 6pk case of [place] daily. RN CM discussed with patient importance of adhering to diabetic diet which includes limiting sugary intake of beevrages. He states he drinks sodas all day because he is "always thirsty." Discussed with pt that this was symptom of elevated glucose level and offered alternative measures. He has scale in the home and voices weighing qday. Weight stable at 165 lbs.  He denies any edema. However, patient voices that he has noticed that he is SOB a lot more lately. He reports that it is happening every day and usually in the evenings. He has not alerted MD of this and RN CM advised patient to do so ASAP. Patient also states that his memory is getting worse as he is getting more forgetful and having a a hard time remembering things. He has missed several MD appts-including PCP appt due to this issue. He reports a family history(his father) had dementia/Alzheimer's disease.   Appointments: Patient voices that he has "make up" appt with PCP on tomorrow and will obtain flu/PNA vaccine as well as have lab work done. He is followed by endocrinologist-Dr.Ellison but unsure of when next appt.  Medications: RN CM unable to review meds with pt as he reports that he is not at home and does not what meds he takes. He states that he has a Insurance claims handler that comes every Wednesday and fills pill box" for him but he is unable to recall name of agency nurse is from.   Encounter Medications:  Outpatient Encounter Medications as of 05/01/2019  Medication Sig  . amitriptyline (ELAVIL) 10 MG tablet Take 1 tablet at bed time for one week and then increase 2 tablets at bed time  . apixaban (ELIQUIS) 5 MG TABS tablet Take 5 mg by mouth 2 (two) times daily.  . carvedilol (COREG) 6.25 MG tablet Take 1 tablet (6.25 mg total) by mouth 2 (two) times daily with a meal.  . donepezil (ARICEPT) 10 MG  tablet Take 1 tablet (10 mg total) by mouth at bedtime.  . Evolocumab (REPATHA) 140 MG/ML SOSY Inject into the skin every 14 (fourteen) days.  [name] ezetimibe (ZETIA) 10 MG tablet Take 1 tablet (10 mg total) by mouth daily. (Patient not taking: Reported on 04/11/2019)  . glucose blood (CONTOUR TEST) test strip 1 each by Other route 2 (two) times a day.  . Insulin Degludec (TRESIBA FLEXTOUCH) 200 UNIT/ML SOPN Inject 220 Units into the skin daily. And pen needles 2/day (Patient not taking: Reported on 05/01/2019)   . Insulin Syringe-Needle U-100 (INSULIN SYRINGE .3CC/29GX1/2") 29G X 1/2" 0.3 ML MISC Inject 1 Syringe 3 (three) times daily as directed. Check blood sugar three times daily. Dx: E11.9  . lisinopril (PRINIVIL,ZESTRIL) 10 MG tablet Take 1 tablet (10 mg total) by mouth at bedtime.  . nitroGLYCERIN (NITROSTAT) 0.4 MG SL tablet Place 1 tablet (0.4 mg total) under the tongue every 5 (five) minutes as needed for chest pain.   No facility-administered encounter medications on file as of 05/01/2019.     Functional Status:  In your present state of health, do you have any difficulty performing the following activities: 05/01/2019 01/23/2019  Hearing? N N  Vision? N N  Difficulty concentrating or making decisions? Y N  Comment pt. reports increasing memory loss/forgetfulness -  Walking or climbing stairs? N N  Dressing or bathing? N N  Doing errands, shopping? N N  Preparing Food and eating ? N -  Using the Toilet? N -  In the past six months, have you accidently leaked urine? N -  Do you have problems with loss of bowel control? N -  Managing your Medications? Y -  Comment states "nurse" comes weekly to fill med planner -  Managing your Finances? N -  Housekeeping or managing your Housekeeping? N -  Some recent data might be hidden    Fall/Depression Screening: Fall Risk  05/01/2019 12/20/2018 08/06/2018  Falls in the past year? 0 1 0  Number falls in past yr: 0 0 0  Injury with Fall? 0 0 0  Risk for fall due to : History of fall(s);Impaired balance/gait;Impaired mobility;Medication side effect;Mental status change - -  Follow up Falls evaluation completed;Education provided;Falls prevention discussed - -   PHQ 2/9 Scores 05/01/2019 04/26/2018 06/20/2017  PHQ - 2 Score 0 0 0    Assessment:  THN CM Care Plan Problem One     Most Recent Value  Care Plan Problem One  Knowledge deficit related to disease process and mgmt of Diabetes  Role Documenting the Problem One  Care Management  Telephonic [place] for Problem One  Active  [facility] Long Term Goal   Patient will report a lowering/decrease in A1C level of 11.0 over the next 90 days.  THN Long Term Goal Start Date  05/01/19  Interventions for Problem One Long Term Goal  RN CM educated pt on diabetic diet and restricitions. RN CM discussed current A1C level and overall health risk.  THN CM Short Term Goal #1   Patient will be able to verbalize at 2-3 symptoms of hyperglycemia over the next 30days.  THN CM Short Term Goal #1 Start Date  05/01/19  Interventions for Short Term Goal #1  RN CM educated pt. on s/s of hyperglycemia and how to manage.   THN CM Short Term Goal #2   Patient will report a decrease in sugary drinks intake daily over the next 30 days.  THN CM Short  Term Goal #2 Start Date  05/01/19  Interventions for Short Term Goal #2  RN CM dsicsused with pt. diabetic restrictions along with healthier options to promote lowered cbgs.  THN CM Short Term Goal #3  Patient will report adherence to med regimen as ordered by MD over the next 60 days.  THN CM Short Term Goal #3 Start Date  05/01/19  Interventions for Short Tern Goal #3  RN CM assessed for med adherence barriers. RNCM placed [facility] pharmacy referral for assistance.    THN CM Care Plan Problem Two     Most Recent Value  Care Plan Problem Two  Patient at risk for falls.  Role Documenting the Problem Two  Care Management Telephonic [place] for Problem Two  Active  Interventions for Problem Two Long Term Goal   RN CM completed falls assessment. RNCM instructed pt. on fall/safety measures in the home.  THN Long Term Goal  Patient will report no falls in the home over the next 90 days.  THN Long Term Goal Start Date  05/01/19  [facility] CM Short Term Goal #1   Patient will report using his DME(cane/walker) in the home more frequently to decrease risk of falls over the next 30 days.  THN CM Short Term Goal #1 Start Date  05/01/19  Interventions  for Short Term Goal #2   RN CM educated pt. on the importance of using DME to aide in safety.      Consent: [facility] services reviewed and discussed with patient. Verbal consent for services given.   Plan:  RN CM will follow up with patient within a month. RN CM will send welcome letter to patient. RN CM will send [facility] pharmacy referral for possible med assistance,med review and to assess med adherence. RN CM will send barriers letter and route encounter to PCP.    Enzo Montgomery, RN,BSN,CCM [place] Management Telephonic Care Management Coordinator Direct Phone: [phone] Toll Free: [phone] Fax: [phone]

## 2019-05-02 ENCOUNTER — Other Ambulatory Visit: Payer: Self-pay

## 2019-05-02 ENCOUNTER — Other Ambulatory Visit: Payer: Medicare HMO

## 2019-05-02 ENCOUNTER — Ambulatory Visit (INDEPENDENT_AMBULATORY_CARE_PROVIDER_SITE_OTHER): Payer: Medicare HMO

## 2019-05-02 ENCOUNTER — Other Ambulatory Visit: Payer: Self-pay | Admitting: Pharmacist

## 2019-05-02 DIAGNOSIS — I5022 Chronic systolic (congestive) heart failure: Secondary | ICD-10-CM

## 2019-05-02 DIAGNOSIS — Z794 Long term (current) use of insulin: Secondary | ICD-10-CM | POA: Diagnosis not present

## 2019-05-02 DIAGNOSIS — Z1159 Encounter for screening for other viral diseases: Secondary | ICD-10-CM | POA: Diagnosis not present

## 2019-05-02 DIAGNOSIS — Z23 Encounter for immunization: Secondary | ICD-10-CM | POA: Diagnosis not present

## 2019-05-02 DIAGNOSIS — E1140 Type 2 diabetes mellitus with diabetic neuropathy, unspecified: Secondary | ICD-10-CM | POA: Diagnosis not present

## 2019-05-02 NOTE — Patient Outreach (Addendum)
[facility]) Care Management  [place]   05/02/2019  Brason Berthelot [birth year] [id number]  Reason for referral: Medication Management  Referral source: [facility] RN Current insurance: [employer]  PMHx includes but not limited to:  T2DM with polyneuropathy, CAD, renal insufficiency, retinopathy, hx noncompliance, mild cognitive impairment, GERD, HTN, HLD, NICM, PAF on anticoagulation, obesity  Per review of EMR:  Endocrinologist recently adjusted insulin from Lantus to [place] 220 units daily, RX sent to [facility] mail order pharmacy  Followed by EMT via HF service with home visits to fill pillbox for medication compliance but difficulty with patient reading labels due to vision difficulty  CSW at HF clinic assisted patient with Extra Help application on 02/09/3733 - unclear if this was approved or denied  Outreach:  Successful telephone call with Mr. Resnik.  HIPAA identifiers verified.  Patient reports he was supposed to have new insulin sent from [place] but he hasn't received it yet.  He states he did receive more needles but he has plenty of these already.  He is not sure the names of his medications and asks that I call him back after 3:00 PM when he will be back at home.   3:24PM: Successful call to patient however he is not home yet and does not think he will be home for at least another 30 minutes.  Scheduled phone visit with patient for next Monday.    Plan: Call patient again next week to review medications and assess medication adherence.   Ralene Bathe, PharmD, [place] [phone]

## 2019-05-03 LAB — HEPATITIS C ANTIBODY
Hepatitis C Ab: NONREACTIVE
SIGNAL TO CUT-OFF: 0.01 (ref <1.00)

## 2019-05-03 LAB — BASIC METABOLIC PANEL WITH GFR
BUN: 11 mg/dL (ref 7–25)
CO2: 27 mmol/L (ref 20–32)
Calcium: 9 mg/dL (ref 8.6–10.3)
Chloride: 103 mmol/L (ref 98–110)
Creat: 1.17 mg/dL (ref 0.70–1.33)
GFR, Est African American: 79 mL/min/{1.73_m2} (ref >=60)
GFR, Est Non African American: 68 mL/min/{1.73_m2} (ref >=60)
Glucose, Bld: 278 mg/dL — ABNORMAL HIGH (ref 65–99)
Potassium: 4.3 mmol/L (ref 3.5–5.3)
Sodium: 137 mmol/L (ref 135–146)

## 2019-05-03 LAB — CBC WITH DIFFERENTIAL/PLATELET
Absolute Monocytes: 271 cells/uL (ref 200–950)
Basophils Absolute: 18 cells/uL (ref 0–200)
Basophils Relative: 0.4 %
Eosinophils Absolute: 83 cells/uL (ref 15–500)
Eosinophils Relative: 1.8 %
HCT: 44.7 % (ref 38.5–50.0)
Hemoglobin: 14.8 g/dL (ref 13.2–17.1)
Lymphs Abs: 1891 cells/uL (ref 850–3900)
MCH: 30.5 pg (ref 27.0–33.0)
MCHC: 33.1 g/dL (ref 32.0–36.0)
MCV: 92 fL (ref 80.0–100.0)
MPV: 12.4 fL (ref 7.5–12.5)
Monocytes Relative: 5.9 %
Neutro Abs: 2337 cells/uL (ref 1500–7800)
Neutrophils Relative %: 50.8 %
Platelets: 207 10*3/uL (ref 140–400)
RBC: 4.86 10*6/uL (ref 4.20–5.80)
RDW: 12.1 % (ref 11.0–15.0)
Total Lymphocyte: 41.1 %
WBC: 4.6 10*3/uL (ref 3.8–10.8)

## 2019-05-06 ENCOUNTER — Other Ambulatory Visit: Payer: Self-pay | Admitting: Pharmacist

## 2019-05-06 ENCOUNTER — Ambulatory Visit: Payer: Self-pay | Admitting: Pharmacist

## 2019-05-06 NOTE — Patient Outreach (Signed)
[facility]) Care Management  [place]  05/06/2019  Calvin [name] 05-Jun-1961 [id number]  Reason for referral: Medication Management  Referral source: [facility] RN Current insurance: [employer]  PMHx includes but not limited to:  T2DM with polyneuropathy, CAD, renal insufficiency, retinopathy, hx noncompliance, mild cognitive impairment, GERD, HTN, HLD, NICM, PAF on anticoagulation, obesity  Per review of EMR:  Endocrinologist recently adjusted insulin from Lantus to [place] 220 units daily, RX sent to [facility] mail order pharmacy  Followed by EMT via HF service with home visits to fill pillbox for medication compliance but difficulty with patient reading labels due to vision difficulty  CSW at HF clinic assisted patient with Extra Help application on 0/09/90 - unclear if this was approved or denied  Outreach:  Unsuccessful telephone call attempt #2 to patient.   HIPAA compliant voicemail left requesting a return call  Plan:  -I will make another outreach attempt to patient within 3-4 business days.    Ralene Bathe, PharmD, [place] [phone]

## 2019-05-07 ENCOUNTER — Encounter (HOSPITAL_COMMUNITY): Payer: Medicare HMO

## 2019-05-09 ENCOUNTER — Ambulatory Visit: Payer: Self-pay | Admitting: Pharmacist

## 2019-05-09 ENCOUNTER — Other Ambulatory Visit: Payer: Self-pay

## 2019-05-09 NOTE — Patient Outreach (Addendum)
[facility]) Care Management  [place]   05/09/2019  Burdett Pinzon 05/10/1961 [id number]  Reason for referral: Medication Management  Referral source: [facility] RN Current insurance: [employer]  PMHx includes but not limited to: T2DM with polyneuropathy, CAD, renal insufficiency, retinopathy, hx noncompliance, mildcognitive impairment, GERD, HTN, HLD, NICM, PAF on anticoagulation, obesity  Per review of EMR:  Endocrinologist recently adjusted insulin from Lantus to [place] 220 units daily, RX sent to [facility] mail order pharmacy  Followed by EMT via HF service with home visits to fill pillbox for medication compliance but difficulty with patient reading labels due to vision difficulty  CSW at HF clinic assisted patient with Extra Help application on 07/04/1550 - unclear if this was approved or denied  Outreach:  Unsuccessful telephone call attempt #3 to patient.   HIPAA compliant voicemail left requesting a return call  Plan:  -I will close [place] case at this time as I have been unable to establish and/or maintain contact with patient.   Erick Blinks, PharmD Candidate [facility], PharmD, [place] [phone]

## 2019-05-13 ENCOUNTER — Other Ambulatory Visit (HOSPITAL_COMMUNITY): Payer: Self-pay

## 2019-05-13 NOTE — Progress Notes (Signed)
Paramedicine Encounter    Patient ID: Calvin [name], male    DOB: 02-15-1961, 58 y.o.   MRN: [id number]   [name], NP as PCP - General (Geriatric Medicine) [name], MD as PCP - Cardiology (Cardiology) [name], [name], MD as PCP - Advanced Heart Failure (Cardiology) [name], MD as Consulting Physician (Endocrinology) Florance, Tomasa Blase, RN as Ama Management  Patient Active Problem List   Diagnosis Date Noted  . Foot pain, bilateral 02/01/2019  . Abdominal pain 01/23/2019  . Chest pain 01/23/2019  . Right leg pain 12/20/2018  . Overgrown toenails 12/20/2018  . Unsteady gait 12/20/2018  . Encounter for therapeutic drug monitoring 10/22/2018  . Nausea and vomiting 03/10/2018  . AF (paroxysmal atrial fibrillation) ([place]) 03/10/2018  . Non-cardiac chest pain 03/09/2018  . Diabetic neuropathy ([place]) 09/26/2017  . MCI (mild cognitive impairment) 06/23/2017  . OSA on CPAP 05/10/2017  . Gastroesophageal reflux disease 05/10/2017  . Insomnia 05/10/2017  . Onychomycosis of multiple toenails with type 2 diabetes mellitus and peripheral neuropathy ([place]) 05/10/2017  . Chronic systolic heart failure ([place]) 11/22/2015  . Essential hypertension 11/22/2015  . DM (diabetes mellitus) ([place]) 11/20/2015  . HLD (hyperlipidemia) 11/20/2015  . Hx of adenomatous colonic polyps 05/19/2011    Current Outpatient Medications:  .  amitriptyline (ELAVIL) 10 MG tablet, Take 1 tablet at bed time for one week and then increase 2 tablets at bed time, Disp: 60 tablet, Rfl: 3 .  apixaban (ELIQUIS) 5 MG TABS tablet, Take 5 mg by mouth 2 (two) times daily., Disp: , Rfl:  .  carvedilol (COREG) 6.25 MG tablet, Take 1 tablet (6.25 mg total) by mouth 2 (two) times daily with a meal., Disp: 180 tablet, Rfl: 3 .  donepezil (ARICEPT) 10 MG tablet, Take 1 tablet (10 mg total) by mouth at bedtime., Disp: 90 tablet, Rfl: 3 .   Evolocumab (REPATHA) 140 MG/ML SOSY, Inject into the skin every 14 (fourteen) days., Disp: , Rfl:  .  ezetimibe (ZETIA) 10 MG tablet, Take 1 tablet (10 mg total) by mouth daily., Disp: 90 tablet, Rfl: 3 .  glucose blood (CONTOUR TEST) test strip, 1 each by Other route 2 (two) times a day., Disp: 200 each, Rfl: 3 .  Insulin Syringe-Needle U-100 (INSULIN SYRINGE .3CC/29GX1/2") 29G X 1/2" 0.3 ML MISC, Inject 1 Syringe 3 (three) times daily as directed. Check blood sugar three times daily. Dx: E11.9, Disp: 100 each, Rfl: 3 .  lisinopril (PRINIVIL,ZESTRIL) 10 MG tablet, Take 1 tablet (10 mg total) by mouth at bedtime., Disp: 90 tablet, Rfl: 3 .  Insulin Degludec (TRESIBA FLEXTOUCH) 200 UNIT/ML SOPN, Inject 220 Units into the skin daily. And pen needles 2/day (Patient not taking: Reported on 05/01/2019), Disp: 36 pen, Rfl: 3 .  nitroGLYCERIN (NITROSTAT) 0.4 MG SL tablet, Place 1 tablet (0.4 mg total) under the tongue every 5 (five) minutes as needed for chest pain. (Patient not taking: Reported on 05/13/2019), Disp: 30 tablet, Rfl: 1 No Known Allergies    Social History   Socioeconomic History  . Marital status: Single    Spouse name: Not on file  . Number of children: 0  . Years of education: Not on file  . Highest education level: Not on file  Occupational History  . Occupation: Disability  Social Needs  . Financial resource strain: Not on file  . Food insecurity    Worry: Not on file    Inability: Not on file  .  Transportation needs    Medical: No    Non-medical: No  Tobacco Use  . Smoking status: Never Smoker  . Smokeless tobacco: Never Used  Substance and Sexual Activity  . Alcohol use: No  . Drug use: No  . Sexual activity: Yes    Birth control/protection: None  Lifestyle  . Physical activity    Days per week: Not on file    Minutes per session: Not on file  . Stress: Not on file  Relationships  . Social Herbalist on phone: Not on file    Gets together: Not on  file    Attends religious service: Not on file    Active member of club or organization: Not on file    Attends meetings of clubs or organizations: Not on file    Relationship status: Not on file  . Intimate partner violence    Fear of current or ex partner: Not on file    Emotionally abused: Not on file    Physically abused: Not on file    Forced sexual activity: Not on file  Other Topics Concern  . Not on file  Social History Narrative   Social History   Diet?    Do you drink/eat things with caffeine? yes   Marital status?       single      Do you live in a house, apartment, assisted living, condo, trailer, etc.? yes   Is it one or more stories? One story   How many persons live in your home?   Do you have any pets in your home? (please list)    Highest level of education completed? graduate   Do you exercise?            no                          Type & how often?   Advanced Directives   Do you have a living will? no   Do you have a DNR form?                                  If not, do you want to discuss one? no   Do you have signed POA/HPOA for forms? no      Functional Status   Do you have difficulty bathing or dressing yourself? no   Do you have difficulty preparing food or eating? no   Do you have difficulty managing your medications? no   Do you have difficulty managing your finances? no   Do you have difficulty affording your medications? no    Physical Exam      Future Appointments  Date Time Provider [place]  05/28/2019 11:30 AM Florance, Tomasa Blase, RN THN-COM None  06/06/2019 10:00 AM [name], MD LBPC-LBENDO None  08/02/2019  8:30 AM [name], NP PSC-PSC None    BP 124/70   Pulse 94   Temp 98 F (36.7 C)   Resp 16   Wt 167 lb (75.8 kg)   SpO2 98%   BMI 28.67 kg/m  CBG'S PTA-400-450  Weight yesterday-165 Last visit weight-165  When I arrived at our appoint time pt was not home, I contacted him and he was at  the store up the road and was walking back down to the apt and be there in 31mn.  Have  not seen pt in a few wks due to him not being at home.   He denies missing any meds.  Need to refill amitriptyline Has not gotten the tresiba yet-- Called pharmacy to ask about that and order the amit He has not been in contact with [facility] pharmacist-he has not answered nor returned their phone calls. But it looks like the case has been closed-I could call them and see if they are willing to try to reach out again.  The spiro and atorvastatin has been removed from his med list.  It looks like his PCP removed it but I sent her a message just to be sure.  It looks like his doc denied the request for the amitrip to be refilled, I sent them a message asking if he needs appoint to get it refilled.  He states he has constant h/a, dizzy and feeling tired. His PCP advised it was likely due to his blood sugar out of control.  He denies any c/p.  2 pill boxes were filled since he is hit or miss with appointments weekly- the 2nd one is missing the amitriptyline.  Called to refill his eliquis as well. He is able to pick up this week.    Marylouise Stacks, [facility] Paramedic  05/13/19

## 2019-05-14 ENCOUNTER — Other Ambulatory Visit: Payer: Self-pay

## 2019-05-14 ENCOUNTER — Other Ambulatory Visit: Payer: Self-pay | Admitting: Pharmacist

## 2019-05-14 MED ORDER — AMITRIPTYLINE HCL 10 MG PO TABS: ORAL_TABLET | ORAL | 3 refills | Status: DC

## 2019-05-14 NOTE — Patient Outreach (Signed)
[place] [facility]) Care Management  [place]   05/14/2019  Alec Jaros 1961-04-23 [id number]  [facility] pharmacy previously referred to assist with medication management however was unable to engage with patient.  Message received from EMT Marylouise Stacks that patient is now willing to discuss medications with pharmacy team.  Will open up new [facility] pharmacy case.    Reason for referral: Medication Management  Referral source: [facility] RN Current insurance: [employer]  PMHx includes but not limited to:  T2DM with polyneuropathy, CAD, renal insufficiency, retinopathy, hx noncompliance, mild cognitive impairment, GERD, HTN, HLD, NICM, PAF on anticoagulation, obesity ----------------------------------------------------------------------------------- Per review of EMR:  Endocrinologist recently adjusted insulin from Lantus to [place] 220 units daily, RX sent to [facility] mail order pharmacy  Followed by EMT via HF service with home visits to fill pillbox for medication compliance, difficulty with patient reading labels due to vision difficulty  CSW at HF clinic assisted patient with Extra Help application on 11/09/1636 - unclear if this was approved or denied  PCP notes state patient has frequent falls, tripping, remains on anticoagulation ---------------------------------------------------------------------------------- Per notes from EMT on 11/9, pillbox filled x 2 weeks.  Patient has poor memory and struggles with compliance / refills.  He has medications filled at both [facility] mail order for most of medications and [place] for Eliquis.    -[facility]  -No refills on amitriptyline, unable to complete 1 week of pillbox fills as bottle ran out, neurology authorized refills for now but patient must be seen for further refills  -Tresiba in process of shipping from [place]  -Unclear if patient should still be on spironolactone and  atorvastatin as these were recently removed from active medication list but may be due to miscommunication. PCP office will confirm.    Outreach:  Call placed to [employer].  Confirmed patient has FULL Extra Help based on current co-pay for Eliquis x 30 day = $8.95.  Pharmacist able to update to 90 day supply for the same co-pay $8.95 and will have it ready for patient to pick up later today. No refills after this, will need new RX sent in by provider.    Amitriptyline RX sent into [place] by neurology office x 90 day supply, patient needs office visit for future refills.   No current o/v scheduled at this time.    Call placed to EMT Marylouise Stacks to provide update and discuss patient.  Message left.   Call placed to Mr. Quentin Cornwall.  Patient answered phone but stated he could not hear well. He states he will call me right back on another phone but did not return call.   Plan: -F/u with EMT for collaboration on medication management -Assist EMT and patient with scheduling office visits to neurology and cardiology -Discuss possible additional enrollment in the [facility] CHF monitoring program as patient would receive free scale / BP monitor with bluetooth capabilities and have 24 hour monitoring from RN with [employer] -Will need to confirm diabetes management, how patient is checking and recording CBGs and administering insulin with vision difficulties  Ralene Bathe, PharmD, [place] [phone]

## 2019-05-17 ENCOUNTER — Ambulatory Visit: Payer: Self-pay | Admitting: Pharmacist

## 2019-05-17 ENCOUNTER — Other Ambulatory Visit: Payer: Self-pay | Admitting: Pharmacist

## 2019-05-17 NOTE — Patient Outreach (Signed)
[facility]) Care Management  [place] 05/17/2019  Calvin [name] 03-11-1961 [id number]  Reason for call: f/u on medication management  Successful call with Mr. Brickey.  He is able to review medications and can read off the labels easily.  He reports that his CBGs are usually ~400 every day.  He states he has not received the [place] insulin yet from [facility] which was ordered earlier this week.  It should arrive within the next few days.  Patient is currently still using Lantus insulin BID.  Patient also reports he has 1 pen left of Repatha and will use this next Wednesday.  He does not recall what pharmacy filled it for him.    -Per review of notes, patient approved for [place] for [place] and RX was sent to local pharmacy.  [facility]. [facility] but does not have the [employer] copay card information.   Medications Reviewed Today    Reviewed by Rudean Haskell, [facility] (Pharmacist) on 05/17/19 at 1033  Med List Status: <None>  Medication Order Taking? Sig Documenting Provider Last Dose Status Informant  amitriptyline (ELAVIL) 10 MG tablet [id number] Yes Take 1 tablet at bed time for one week and then increase 2 tablets at bed time Cameron Sprang, MD Taking Active   apixaban (ELIQUIS) 5 MG TABS tablet [id number] Yes Take 5 mg by mouth 2 (two) times daily. [provider] Taking Active Self  carvedilol (COREG) 6.25 MG tablet [id number] Yes Take 1 tablet (6.25 mg total) by mouth 2 (two) times daily with a meal. Larey Dresser, MD Taking Active Self  donepezil (ARICEPT) 10 MG tablet [id number] Yes Take 1 tablet (10 mg total) by mouth at bedtime. Cameron Sprang, MD Taking Active Self  Evolocumab ([place]) 140 MG/ML Babette Relic [id number] Yes Inject into the skin every 14 (fourteen) days. [provider] Taking Active   ezetimibe (ZETIA) 10 MG tablet [id number] Yes Take 1 tablet (10 mg  total) by mouth daily. [name], MD Taking Active   glucose blood (CONTOUR TEST) test strip [id number]  1 each by Other route 2 (two) times a day. [name], MD  Active   Insulin Degludec [facility]) 200 UNIT/ML SOPN [id number] No Inject 220 Units into the skin daily. And pen needles 2/day  Patient not taking: Reported on 05/17/2019   [name], MD Not Taking Active   Insulin Syringe-Needle U-100 (INSULIN SYRINGE .3CC/29GX1/2") 29G X 1/2" 0.3 ML MISC [id number] Yes Inject 1 Syringe 3 (three) times daily as directed. Check blood sugar three times daily. Dx: E11.9 [name], DO Taking Active Self  lisinopril (PRINIVIL,ZESTRIL) 10 MG tablet [id number] Yes Take 1 tablet (10 mg total) by mouth at bedtime. Larey Dresser, MD Taking Active Self  nitroGLYCERIN (NITROSTAT) 0.4 MG SL tablet [id number] Yes Place 1 tablet (0.4 mg total) under the tongue every 5 (five) minutes as needed for chest pain. [name], DO Taking Active Self           Plan: -Will f/u with patient early next week regarding arrival of Tresiba insulin -Will f/u with EMT to find out where Repatha was last filled   Ralene Bathe, PharmD, [place] [phone]

## 2019-05-20 ENCOUNTER — Other Ambulatory Visit: Payer: Self-pay | Admitting: Pharmacist

## 2019-05-20 NOTE — Patient Outreach (Addendum)
[facility]) Care Management  [place]   05/20/2019  Calvin [name] January 30, 1961 [id number]  Communication with EMT Calvin [name] and PharmD at cardiology office re: patient.  -Unclear where patient had Repatha last filled.  No grant money used yet.  Calvin [name] co-pay card information received.  -PCP ok'd resuming spironolactone and atorvastatin.  EMT will stop by patient's home this week to add medications back into pillboxes.  Patient should still have medications at home.  -Per EMT, patient is 98% adherence with his medications per review of pillboxes.  -Patient not using [employer] OTC catalogue.  Will mail copy of catalogue to home and email copy to EMT.   Successful outreach to [employer].  Repatha able to be billed to [facility] x 90 day supply for $8.95 (due to Extra Help) then dual billed to grant foundation, lowering co-pay to $0.  Pharmacy will special order medication and it should be ready to pick up in 1-2 days.   Successful call to patient to provide update on Repatha.  Patient voiced understanding and will pick up later this week. Tresiba insulin has still not arrived from [place].  Reviewed with patient that medication can also be sent to [place] for the same co-pay.  If insulin has not arrived by the end of the week, will request RX be sent to [facility] to have filled locally for patient to pick up rather than continue to wait for [facility] mail order as CBGs are still very elevated.    Plan: F/u again with patient later this week re: Calvin [name], PharmD, [place] [phone]

## 2019-05-21 ENCOUNTER — Ambulatory Visit: Payer: Medicare HMO | Admitting: Pharmacist

## 2019-05-22 ENCOUNTER — Other Ambulatory Visit: Payer: Self-pay

## 2019-05-22 NOTE — Patient Outreach (Signed)
[place] [facility]) Care Management  05/22/2019  Calvin [name] 30-Oct-1960 [id number]   Telephone Assessment  Outreach attempt #1 to patient. Spoke briefly with patient as he voices he was not feeling well. Patient with complaints of chills and going from feeling cold to hot. He reports symptoms started about 30 minutes ago. RN CM assessed if pt has checked his vital signs in the home and he reported as well as he has not taken his cbg. However, he states that cbg this morning was 400 around 8am . He did eat breakfast and took his insulin. He has not rechecked cbg since then. RN CM instructed patient to check vital signs and cbg immediately and advised of abnormal value. Discussed s/s of hyperglycemia and how to treat with pt. Reviewed action plan to take and he voiced understanding.     Plan: RN CM discussed with patient next outreach within a week. Patient gave verbal consent and in agreement with RN CM follow up timeframe. Patient aware that they may contact RN CM sooner for any issues or concerns.  Enzo Montgomery, RN,BSN,CCM [place] Management Telephonic Care Management Coordinator Direct Phone: [phone] Toll Free: [phone] Fax: [phone]

## 2019-05-23 ENCOUNTER — Ambulatory Visit: Payer: Self-pay | Admitting: Pharmacist

## 2019-05-23 ENCOUNTER — Other Ambulatory Visit: Payer: Self-pay | Admitting: Endocrinology

## 2019-05-23 ENCOUNTER — Other Ambulatory Visit: Payer: Self-pay

## 2019-05-23 ENCOUNTER — Other Ambulatory Visit: Payer: Self-pay | Admitting: Pharmacist

## 2019-05-23 MED ORDER — TRESIBA FLEXTOUCH 200 UNIT/ML ~~LOC~~ SOPN: 220.0000 [IU] | PEN_INJECTOR | Freq: Every day | SUBCUTANEOUS | 3 refills | Status: DC

## 2019-05-23 NOTE — Patient Outreach (Signed)
[facility]) Care Management  05/23/2019  Calvin [name] February 22, 1961 [id number]   Telephone Assessment    Follow up call to check patient on back from previous call. Spoke with patient who voices that he still does not feel well. He reports that he is having the same symptoms he was having yesterday. He voices that his blood sugars are continuing to stay in the 400s despite taking his diabetic meds and drinking a lot of water. Patient has not contacted MD to alert them of this. RN CM discussed with patient the importance of getting cbgs lowered to prevent medical emergency. Discussed with patient that he needed to alert MD of this right away. Patient voiced understanding and reported that he would call his MD right away. He is aware to seek medical attention for any worsening and/or unresolved issues/concerns.  THN CM Care Plan Problem One     Most Recent Value  Care Plan Problem One  Knowledge deficit related to disease process and mgmt of Diabetes  Role Documenting the Problem One  Care Management Telephonic Coordinator  Care Plan for Problem One  Active  [facility] Long Term Goal   Patient will report a lowering/decrease in A1C level of 11.0 over the next 90 days.  THN Long Term Goal Start Date  05/01/19  Interventions for Problem One Long Term Goal  RN CM reviewed diabetic education with pt including normal vs abnormal cbgs readings and how to manage. RN CM reviewed with pt when to alert MD of abnormal values.  THN CM Short Term Goal #1   Patient will be able to verbalize at 2-3 symptoms of hyperglycemia over the next 30days.  THN CM Short Term Goal #1 Start Date  05/01/19  Interventions for Short Term Goal #1  RN CM reinforced to pt s/s of hyperglycemia and how to treat it.  THN CM Short Term Goal #2   Patient will report a decrease in sugary drinks intake daily over the next 30 days.  THN CM Short Term Goal #2 Start Date  05/01/19  [facility] CM Short Term Goal #3  Patient  will report adherence to med regimen as ordered by MD over the next 60 days.  THN CM Short Term Goal #3 Start Date  05/01/19  Interventions for Short Tern Goal #3  RN CM cofnirmed with pt that he is taking meds as ordered.    THN CM Care Plan Problem Two     Most Recent Value  Care Plan Problem Two  Patient at risk for falls.  Role Documenting the Problem Two  Care Management Telephonic [place] for Problem Two  Active  Interventions for Problem Two Long Term Goal   RN CM did not assess this call-due to acute issue  THN Long Term Goal  Patient will report no falls in the home over the next 90 days.  THN Long Term Goal Start Date  05/01/19  [facility] CM Short Term Goal #1   Patient will report using his DME(cane/walker) in the home more frequently to decrease risk of falls over the next 30 days.  THN CM Short Term Goal #1 Start Date  05/01/19  Interventions for Short Term Goal #2   RN CM did not assess this call-due to acute issue      Plan: RN CM discussed with patient next outreach within a month. Patient gave verbal consent and in agreement with RN CM follow up timeframe. Patient aware that they may contact RN CM  sooner for any issues or concerns.  Enzo Montgomery, RN,BSN,CCM [place] Management Telephonic Care Management Coordinator Direct Phone: [phone] Toll Free: [phone] Fax: [phone]

## 2019-05-23 NOTE — Patient Outreach (Addendum)
[place] [facility]) Care Management  [place]  05/23/2019  Calvin [name] 09-Oct-1960 [id number]   Reason for call: f/u on Tresiba  Per notes with [facility] RN, patient not feeling well, endorsed symptoms of chills, high and low temperatures, and elevated CBGs in 400s.    Outreach:  Unsuccessful telephone call attempt #1 to patient.  Left HIPAA compliant voicemail. Unsuccessful telephone call to EMT Marylouise Stacks to see if she has been able to do home visit this week with patient and if she can tell if Tresiba insulin has arrived from mail order pharmacy.   Plan:  Will request new RX for [name] be sent to [place] for immediate pick up if necessary.  Co-pay will be the same through [facility] or [facility] mail order pharmacy.   Will f/u again with patient tomorrow.    Ralene Bathe, PharmD, [place] [phone]

## 2019-05-24 ENCOUNTER — Ambulatory Visit: Payer: Self-pay | Admitting: Pharmacist

## 2019-05-24 ENCOUNTER — Other Ambulatory Visit: Payer: Self-pay | Admitting: Pharmacist

## 2019-05-24 ENCOUNTER — Telehealth: Payer: Self-pay

## 2019-05-24 ENCOUNTER — Other Ambulatory Visit: Payer: Self-pay

## 2019-05-24 NOTE — Telephone Encounter (Signed)
Routing this message to the front desk for scheduling purposes.  [name], MD  Rudean Haskell, [facility]; [place]istro Rauf, LPN        OK, [facility]   Aimee: please contact patient: please move up next appt to next avail, as cbg is really high.    Previous Messages  ----- Message -----  From: Rudean Haskell, [facility]  Sent: 05/23/2019 11:04 AM EST  To: [name], MD  Subject: Aline Brochure Dr. Loanne Drilling,   Can you please send in new RX for Tresiba to patient's local [facility]? He still has not received it from the mail order pharmacy and CBGs remain elevated in 400s. Co-pay will be the [facility] for him at [facility] so he should be able to afford ($8.95 x 90 day supply with Extra Help).   Thank you so much,  Ralene Bathe, PharmD, [place]  [phone]

## 2019-05-24 NOTE — Patient Outreach (Signed)
[facility]) Care Management  [place] 05/24/2019  Nghia Mcentee 01/03/1961 [id number]  Communication received from endocrinology office (Dr. Loanne Drilling) that Ernst Spell sent in to [place].  Office will contact patient to schedule soonest available appointment due to elevated CBGs.    Call placed to [place].   -2 medications ready for pick-up: Eliquis ($8.95) and Repatha ($0) -Tresiba $384 - may need claim reversed from [facility]  10:29AM Call placed to patient.  He reports [name] has still not arrived and he is busy at the moment and cannot talk with him more about it.  He reports he will call me in an hour.   12:09PM 2nd call placed to patient as I have not heard back from him yet.  No voicemail, call disconnected.   Plan: Will outreach EMT Marylouise Stacks to collaborate as she may be able to assist with call to [place] if she is going to patient's home today to [place], PharmD, [place] [phone]    s

## 2019-05-24 NOTE — Telephone Encounter (Signed)
Patient is scheduled for appointment on 05/27/19 at 8:00 a.m.

## 2019-05-27 ENCOUNTER — Telehealth (HOSPITAL_COMMUNITY): Payer: Self-pay

## 2019-05-27 ENCOUNTER — Ambulatory Visit (INDEPENDENT_AMBULATORY_CARE_PROVIDER_SITE_OTHER): Payer: Medicare HMO | Admitting: Endocrinology

## 2019-05-27 ENCOUNTER — Ambulatory Visit: Payer: Self-pay | Admitting: Pharmacist

## 2019-05-27 ENCOUNTER — Other Ambulatory Visit: Payer: Self-pay

## 2019-05-27 ENCOUNTER — Encounter: Payer: Self-pay | Admitting: Endocrinology

## 2019-05-27 VITALS — BP 116/72 | HR 92 | Ht 64.0 in | Wt 164.8 lb

## 2019-05-27 DIAGNOSIS — Z794 Long term (current) use of insulin: Secondary | ICD-10-CM

## 2019-05-27 DIAGNOSIS — E1140 Type 2 diabetes mellitus with diabetic neuropathy, unspecified: Secondary | ICD-10-CM

## 2019-05-27 MED ORDER — TRESIBA FLEXTOUCH 200 UNIT/ML ~~LOC~~ SOPN: 220.0000 [IU] | PEN_INJECTOR | Freq: Every day | SUBCUTANEOUS | 3 refills | Status: DC

## 2019-05-27 NOTE — Telephone Encounter (Signed)
Pt reports he will not be home this evening for our appointment. He still isnt feeling well, was seen at doc today and also has appoint tomor.  [facility] and we planned for me to come out tomor around 4.  We need to call [employer] while I am there to see why he never received his insulin b/c we called the [place] the last time I was there and ordered it.   Marylouise Stacks, EMT-Paramedic  05/27/19

## 2019-05-27 NOTE — Patient Instructions (Addendum)
I have sent a prescription to [place], to change the Lantus to [place], 220 units daily.  This is a very slow time-release insulin.  If you miss it, you can make it up when you remember.   check your blood sugar twice a day.  vary the time of day when you check, between before the 3 meals, and at bedtime.  also check if you have symptoms of your blood sugar being too high or too low.  please keep a record of the readings and bring it to your next appointment here (or you can bring the meter itself).  You can write it on any piece of paper.  please call us sooner if your blood sugar goes below 70, or if you have a lot of readings over 200. Please come back for a follow-up appointment in 2 months.

## 2019-05-27 NOTE — Progress Notes (Signed)
Subjective:    Patient ID: Calvin [name], male    DOB: Dec 05, 1960, 58 y.o.   MRN: [id number]  HPI Pt returns for f/u of diabetes mellitus: DM type: Insulin-requiring type 2. Dx'ed: 7253 Complications: polyneuropathy, CAD, renal insuff, and DR.   Therapy: insulin since soon after dx.  DKA: never Severe hypoglycemia: never.   Pancreatitis: never Pancreatic imaging: normal on 2003 CT. Other: due to noncompliance, he is not a candidate for multiple daily injections; he is retired.     Interval history: he does not check cbg's  Pt says he never misses the insulin.  He has not yet started the [place].  He says he wants to get via mail order pharmacy Past Medical History:  Diagnosis Date  . Adenoidal hypertrophy   . Adenomatous colon polyps 2012  . Arthritis   . Diabetes mellitus   . GERD (gastroesophageal reflux disease)   . Heart attack ([place])    [place] living in [place]  [name] Hx of adenomatous colonic polyps 08/18/2017  . Hyperlipidemia   . Hypertension   . Mild CAD    a. cath in 08/2017 showing mild nonobstructive CAD with scattered 20-30% stenosis.   . Nonischemic cardiomyopathy ([place])    a. EF 20-25% by echo in 09/2017 with cath showing mild CAD. b.  Last echo 12/2017 EF 35-40%, grade 2 DD.  [name] Obesity   . PAF (paroxysmal atrial fibrillation) ([place])   . Sleep apnea    cpap, pt says no longer has    Past Surgical History:  Procedure Laterality Date  . COLONOSCOPY  05/13/11   9 adenomas  . FOREARM SURGERY    . MUSCLE BIOPSY    . RIGHT/LEFT HEART CATH AND CORONARY ANGIOGRAPHY N/A 08/25/2017   Procedure: RIGHT/LEFT HEART CATH AND CORONARY ANGIOGRAPHY;  Surgeon: [name], MD;  Location: [facility];  Service: Cardiovascular;  Laterality: N/A;    Social History   Socioeconomic History  . Marital status: Single    Spouse name: Not on file  . Number of children: 0  . Years of education: Not on file  . Highest education level: Not on file  Occupational  History  . Occupation: Disability  Social Needs  . Financial resource strain: Not on file  . Food insecurity    Worry: Not on file    Inability: Not on file  . Transportation needs    Medical: No    Non-medical: No  Tobacco Use  . Smoking status: Never Smoker  . Smokeless tobacco: Never Used  Substance and Sexual Activity  . Alcohol use: No  . Drug use: No  . Sexual activity: Yes    Birth control/protection: None  Lifestyle  . Physical activity    Days per week: Not on file    Minutes per session: Not on file  . Stress: Not on file  Relationships  . Social Herbalist on phone: Not on file    Gets together: Not on file    Attends religious service: Not on file    Active member of club or organization: Not on file    Attends meetings of clubs or organizations: Not on file    Relationship status: Not on file  . Intimate partner violence    Fear of current or ex partner: Not on file    Emotionally abused: Not on file    Physically abused: Not on file    Forced sexual activity: Not on file  Other  Topics Concern  . Not on file  Social History Narrative   Social History   Diet?    Do you drink/eat things with caffeine? yes   Marital status?       single      Do you live in a house, apartment, assisted living, condo, trailer, etc.? yes   Is it one or more stories? One story   How many persons live in your home?   Do you have any pets in your home? (please list)    Highest level of education completed? graduate   Do you exercise?            no                          Type & how often?   Advanced Directives   Do you have a living will? no   Do you have a DNR form?                                  If not, do you want to discuss one? no   Do you have signed POA/HPOA for forms? no      Functional Status   Do you have difficulty bathing or dressing yourself? no   Do you have difficulty preparing food or eating? no   Do you have difficulty managing your  medications? no   Do you have difficulty managing your finances? no   Do you have difficulty affording your medications? no    Current Outpatient Medications on File Prior to Visit  Medication Sig Dispense Refill  . amitriptyline (ELAVIL) 10 MG tablet Take 1 tablet at bed time for one week and then increase 2 tablets at bed time 60 tablet 3  . apixaban (ELIQUIS) 5 MG TABS tablet Take 5 mg by mouth 2 (two) times daily.    . carvedilol (COREG) 6.25 MG tablet Take 1 tablet (6.25 mg total) by mouth 2 (two) times daily with a meal. 180 tablet 3  . donepezil (ARICEPT) 10 MG tablet Take 1 tablet (10 mg total) by mouth at bedtime. 90 tablet 3  . Evolocumab (REPATHA) 140 MG/ML SOSY Inject into the skin every 14 (fourteen) days.    [name] ezetimibe (ZETIA) 10 MG tablet Take 1 tablet (10 mg total) by mouth daily. 90 tablet 3  . glucose blood (CONTOUR TEST) test strip 1 each by Other route 2 (two) times a day. 200 each 3  . Insulin Syringe-Needle U-100 (INSULIN SYRINGE .3CC/29GX1/2") 29G X 1/2" 0.3 ML MISC Inject 1 Syringe 3 (three) times daily as directed. Check blood sugar three times daily. Dx: E11.9 100 each 3  . lisinopril (PRINIVIL,ZESTRIL) 10 MG tablet Take 1 tablet (10 mg total) by mouth at bedtime. 90 tablet 3  . nitroGLYCERIN (NITROSTAT) 0.4 MG SL tablet Place 1 tablet (0.4 mg total) under the tongue every 5 (five) minutes as needed for chest pain. 30 tablet 1   No current facility-administered medications on file prior to visit.     No Known Allergies  Family History  Problem Relation Age of Onset  . Heart disease Mother   . Heart attack Mother 71  . Hypertension Mother   . Hyperlipidemia Mother   . Diabetes Mother   . Heart disease Father   . Heart attack Father 50  . Hypertension Father   . Hyperlipidemia Father   .  Diabetes Father   . Heart attack Sister 68  . Colon cancer Neg Hx   . Stomach cancer Neg Hx   . Esophageal cancer Neg Hx   . Rectal cancer Neg Hx     BP 116/72 (BP  Location: Left Arm, Patient Position: Sitting, Cuff Size: Large)   Pulse 92   Ht 5' 4"  (1.626 m)   Wt 164 lb 12.8 oz (74.8 kg)   SpO2 96%   BMI 28.29 kg/m    Review of Systems He denies hypoglycemia.      Objective:   Physical Exam VITAL SIGNS:  See vs page GENERAL: no distress Pulses: dorsalis pedis intact bilat.   MSK: no deformity of the feet CV: no leg edema Skin:  no ulcer on the feet.  normal color and temp on the feet. Neuro: sensation is intact to touch on the feet.       Assessment & Plan:  Insulin-requiring type 2 DM, with renal insuff: he needs increased rx Noncompliance with cbg recording and insulin: we'll change to [place], for dosing flexibility.   Patient Instructions  I have sent a prescription to [facility], to change the Lantus to [place], 220 units daily.  This is a very slow time-release insulin.  If you miss it, you can make it up when you remember.   check your blood sugar twice a day.  vary the time of day when you check, between before the 3 meals, and at bedtime.  also check if you have symptoms of your blood sugar being too high or too low.  please keep a record of the readings and bring it to your next appointment here (or you can bring the meter itself).  You can write it on any piece of paper.  please call us sooner if your blood sugar goes below 70, or if you have a lot of readings over 200. Please come back for a follow-up appointment in 2 months.

## 2019-05-28 ENCOUNTER — Ambulatory Visit: Payer: Self-pay

## 2019-05-28 ENCOUNTER — Telehealth (HOSPITAL_COMMUNITY): Payer: Self-pay

## 2019-05-29 ENCOUNTER — Ambulatory Visit: Payer: Self-pay | Admitting: Pharmacist

## 2019-05-29 NOTE — Telephone Encounter (Signed)
Pt called me yesterday and left a VM stating he had a family emergency he was going out of town for and he would have to catch up with me next week hopefully. He still has not received his insulin and I am not sure if he picked up his eliquis from [facility] either.   Marylouise Stacks, EMT-Paramedic  05/29/19

## 2019-06-03 ENCOUNTER — Telehealth (HOSPITAL_COMMUNITY): Payer: Self-pay

## 2019-06-03 NOTE — Telephone Encounter (Signed)
Contacted pt today regarding of home visit this week. He reports that he is not sure if he will be back this week or not. If he does get home this week it will be late Friday but isnt sure yet. He will keep me posted.   Marylouise Stacks, EMT-Paramedic  06/03/19

## 2019-06-04 ENCOUNTER — Ambulatory Visit: Payer: Self-pay | Admitting: Pharmacist

## 2019-06-05 ENCOUNTER — Emergency Department (HOSPITAL_COMMUNITY): Payer: Medicare HMO

## 2019-06-05 ENCOUNTER — Encounter (HOSPITAL_COMMUNITY): Payer: Self-pay | Admitting: Emergency Medicine

## 2019-06-05 ENCOUNTER — Emergency Department (HOSPITAL_COMMUNITY)
Admission: EM | Admit: 2019-06-05 | Discharge: 2019-06-05 | Disposition: A | Discharge status: Home / Self Care | Payer: Medicare HMO | Attending: Emergency Medicine | Admitting: Emergency Medicine

## 2019-06-05 ENCOUNTER — Other Ambulatory Visit: Payer: Self-pay | Admitting: Pharmacist

## 2019-06-05 ENCOUNTER — Other Ambulatory Visit: Payer: Self-pay

## 2019-06-05 DIAGNOSIS — R52 Pain, unspecified: Secondary | ICD-10-CM

## 2019-06-05 DIAGNOSIS — R739 Hyperglycemia, unspecified: Secondary | ICD-10-CM

## 2019-06-05 DIAGNOSIS — R0789 Other chest pain: Secondary | ICD-10-CM | POA: Diagnosis not present

## 2019-06-05 DIAGNOSIS — Z794 Long term (current) use of insulin: Secondary | ICD-10-CM | POA: Insufficient documentation

## 2019-06-05 DIAGNOSIS — E1165 Type 2 diabetes mellitus with hyperglycemia: Secondary | ICD-10-CM | POA: Insufficient documentation

## 2019-06-05 DIAGNOSIS — Z7901 Long term (current) use of anticoagulants: Secondary | ICD-10-CM | POA: Diagnosis not present

## 2019-06-05 DIAGNOSIS — I2510 Atherosclerotic heart disease of native coronary artery without angina pectoris: Secondary | ICD-10-CM | POA: Diagnosis not present

## 2019-06-05 DIAGNOSIS — I10 Essential (primary) hypertension: Secondary | ICD-10-CM | POA: Insufficient documentation

## 2019-06-05 DIAGNOSIS — M25551 Pain in right hip: Secondary | ICD-10-CM | POA: Diagnosis not present

## 2019-06-05 DIAGNOSIS — Z79899 Other long term (current) drug therapy: Secondary | ICD-10-CM | POA: Insufficient documentation

## 2019-06-05 DIAGNOSIS — R0602 Shortness of breath: Secondary | ICD-10-CM | POA: Diagnosis not present

## 2019-06-05 DIAGNOSIS — Z20828 Contact with and (suspected) exposure to other viral communicable diseases: Secondary | ICD-10-CM | POA: Insufficient documentation

## 2019-06-05 DIAGNOSIS — R079 Chest pain, unspecified: Secondary | ICD-10-CM | POA: Diagnosis not present

## 2019-06-05 LAB — BASIC METABOLIC PANEL
Anion gap: 10 (ref 5–15)
BUN: 15 mg/dL (ref 6–20)
CO2: 27 mmol/L (ref 22–32)
Calcium: 9.4 mg/dL (ref 8.9–10.3)
Chloride: 99 mmol/L (ref 98–111)
Creatinine, Ser: 1.34 mg/dL — ABNORMAL HIGH (ref 0.61–1.24)
GFR calc Af Amer: >60 mL/min (ref >60)
GFR calc non Af Amer: 58 mL/min — ABNORMAL LOW (ref >60)
Glucose, Bld: 283 mg/dL — ABNORMAL HIGH (ref 70–99)
Potassium: 4.3 mmol/L (ref 3.5–5.1)
Sodium: 136 mmol/L (ref 135–145)

## 2019-06-05 LAB — BRAIN NATRIURETIC PEPTIDE: B Natriuretic Peptide: 39.4 pg/mL (ref 0.0–100.0)

## 2019-06-05 LAB — CBC
HCT: 45.6 % (ref 39.0–52.0)
Hemoglobin: 15.2 g/dL (ref 13.0–17.0)
MCH: 30.5 pg (ref 26.0–34.0)
MCHC: 33.3 g/dL (ref 30.0–36.0)
MCV: 91.4 fL (ref 80.0–100.0)
Platelets: 220 10*3/uL (ref 150–400)
RBC: 4.99 MIL/uL (ref 4.22–5.81)
RDW: 11.8 % (ref 11.5–15.5)
WBC: 5.6 10*3/uL (ref 4.0–10.5)
nRBC: 0 % (ref 0.0–0.2)

## 2019-06-05 LAB — POC SARS CORONAVIRUS 2 AG -  ED: SARS Coronavirus 2 Ag: NEGATIVE

## 2019-06-05 LAB — TROPONIN I (HIGH SENSITIVITY)
Troponin I (High Sensitivity): 13 ng/L (ref <18)
Troponin I (High Sensitivity): 13 ng/L (ref <18)

## 2019-06-05 LAB — D-DIMER, QUANTITATIVE: D-Dimer, Quant: 0.39 ug/mL-FEU (ref 0.00–0.50)

## 2019-06-05 MED ORDER — ACETAMINOPHEN 325 MG PO TABS
650.0000 mg | ORAL_TABLET | Freq: Once | ORAL | Status: AC
  Administered 2019-06-05: 650 mg via ORAL
  Filled 2019-06-05: qty 2

## 2019-06-05 MED ORDER — INSULIN ASPART 100 UNIT/ML ~~LOC~~ SOLN
5.0000 [IU] | Freq: Once | SUBCUTANEOUS | Status: AC
  Administered 2019-06-05: 5 [IU] via SUBCUTANEOUS

## 2019-06-05 MED ORDER — SODIUM CHLORIDE 0.9 % IV BOLUS
1000.0000 mL | Freq: Once | INTRAVENOUS | Status: AC
  Administered 2019-06-05: 1000 mL via INTRAVENOUS

## 2019-06-05 MED ORDER — INSULIN ASPART 100 UNIT/ML ~~LOC~~ SOLN: 7.0000 [IU] | Freq: Once | SUBCUTANEOUS | Status: DC

## 2019-06-05 MED ORDER — SODIUM CHLORIDE 0.9% FLUSH
3.0000 mL | Freq: Once | INTRAVENOUS | Status: AC
  Administered 2019-06-05: 3 mL via INTRAVENOUS

## 2019-06-05 NOTE — ED Notes (Signed)
Patient verbalizes understanding of discharge instructions. Opportunity for questioning and answers were provided. Armband removed by staff, pt discharged from ED.  

## 2019-06-05 NOTE — ED Notes (Signed)
Pt reminded of the need for urine.

## 2019-06-05 NOTE — Patient Outreach (Signed)
[facility]) Care Management  [place] 06/05/2019  Calvin [name] September 04, 1960 [id number]  Communication received from [facility] RN that patient is back in town, insulin has not arrived, and he is likely going to ER due to chest pain and not feeling well, CBGs > 400.  Successful call with patient.  He reports he may go to the ER but is still at home currently.  He is agreeable to call [facility] to check into his insulin.    3-way call to [facility].  Per representative, patient has Full Extra Help but still has coverage gap.  She did not have any information on maximum medication pricing to go into coverage gap with Full Extra Help.    Call placed to [employer] Immediate Supply program.  Patient approved for the 1x / year offer to receive 1 fill of insulin up to 50m (3 boxes of Tresiba 200 units/mL).  Received pharmacy card information for billing.   Call placed to [facility]  Provided card [place] for immediate supply program.   Claim approved at no charge.    Updated patient who voiced understanding.  He will pick up [place] today or tomorrow.    Plan: -Will f/u back up with patient later this week to ensure he picked up insulin -[place]ication [place] to inquire further into coverage gap with Full Extra Help -Will route note to EMT / endocrinologist   CRalene Bathe PharmD, [phone]

## 2019-06-05 NOTE — ED Triage Notes (Signed)
C/o generalized sharp pain across chest x 3 days with SOB, chills, and fatigue.

## 2019-06-05 NOTE — Discharge Instructions (Addendum)
Please rest and drink plenty of fluids Quarantine until you get the results of your test which could take up to 2 days Take Tylenol for pain or fever Return if worsening

## 2019-06-05 NOTE — ED Provider Notes (Signed)
[facility] EMERGENCY DEPARTMENT Provider Note   CSN: [id number] Arrival date & time: 06/05/19  1711     History   Chief Complaint Chief Complaint  Patient presents with  . Chest Pain    HPI Calvin [name] is a 58 y.o. male with insulin dependent DM, mild CAD, PAF, and CHF EF 35% who presents with CP/SOB. He states that symptoms have been going on for 2-3 days. He is having sharp stabbing chest pain across his chest. It is constant and severe. He reports associated feeling hot/cold, sweats, headache, and SOB. He feels lightheaded when he walks around. He also has been having R hip pain which radiates to his foot. He denies syncope, anosmia, sore throat, cough, abdominal pain, N/V/D, dysuria. He lives with a roommate. Denies sick contacts. He reports being admitted for similar symptoms in July. He has had a flu shot.     HPI  Past Medical History:  Diagnosis Date  . Adenoidal hypertrophy   . Adenomatous colon polyps 2012  . Arthritis   . Diabetes mellitus   . GERD (gastroesophageal reflux disease)   . Heart attack ([place])    [place]  [name] Hx of adenomatous colonic polyps 08/18/2017  . Hyperlipidemia   . Hypertension   . Mild CAD    a. cath in 08/2017 showing mild nonobstructive CAD with scattered 20-30% stenosis.   . Nonischemic cardiomyopathy ([place])    a. EF 20-25% by echo in 09/2017 with cath showing mild CAD. b.  Last echo 12/2017 EF 35-40%, grade 2 DD.  [name] Obesity   . PAF (paroxysmal atrial fibrillation) ([place])   . Sleep apnea    cpap, pt says no longer has    Patient Active Problem List   Diagnosis Date Noted  . Foot pain, bilateral 02/01/2019  . Abdominal pain 01/23/2019  . Chest pain 01/23/2019  . Right leg pain 12/20/2018  . Overgrown toenails 12/20/2018  . Unsteady gait 12/20/2018  . Encounter for therapeutic drug monitoring 10/22/2018  . Nausea and vomiting 03/10/2018  . AF (paroxysmal atrial fibrillation) ([place]) 03/10/2018  .  Non-cardiac chest pain 03/09/2018  . Diabetic neuropathy ([place]) 09/26/2017  . MCI (mild cognitive impairment) 06/23/2017  . OSA on CPAP 05/10/2017  . Gastroesophageal reflux disease 05/10/2017  . Insomnia 05/10/2017  . Onychomycosis of multiple toenails with type 2 diabetes mellitus and peripheral neuropathy ([place]) 05/10/2017  . Chronic systolic heart failure ([place]) 11/22/2015  . Essential hypertension 11/22/2015  . DM (diabetes mellitus) ([place]) 11/20/2015  . HLD (hyperlipidemia) 11/20/2015  . Hx of adenomatous colonic polyps 05/19/2011    Past Surgical History:  Procedure Laterality Date  . COLONOSCOPY  05/13/11   9 adenomas  . FOREARM SURGERY    . MUSCLE BIOPSY    . RIGHT/LEFT HEART CATH AND CORONARY ANGIOGRAPHY N/A 08/25/2017   Procedure: RIGHT/LEFT HEART CATH AND CORONARY ANGIOGRAPHY;  Surgeon: [name], MD;  Location: [facility];  Service: Cardiovascular;  Laterality: N/A;        Home Medications    Prior to Admission medications   Medication Sig Start Date End Date Taking? Authorizing Provider  amitriptyline (ELAVIL) 10 MG tablet Take 1 tablet at bed time for one week and then increase 2 tablets at bed time 05/14/19   Cameron Sprang, MD  apixaban (ELIQUIS) 5 MG TABS tablet Take 5 mg by mouth 2 (two) times daily.    [provider]  carvedilol (COREG) 6.25 MG tablet Take  1 tablet (6.25 mg total) by mouth 2 (two) times daily with a meal. 09/27/18   Larey Dresser, MD  donepezil (ARICEPT) 10 MG tablet Take 1 tablet (10 mg total) by mouth at bedtime. 09/18/18   Cameron Sprang, MD  Evolocumab (REPATHA) 140 MG/ML SOSY Inject into the skin every 14 (fourteen) days.    [provider]  ezetimibe (ZETIA) 10 MG tablet Take 1 tablet (10 mg total) by mouth daily. 04/11/19   [name], MD  glucose blood (CONTOUR TEST) test strip 1 each by Other route 2 (two) times a day. 02/01/19   [name], MD  Insulin Degludec (TRESIBA FLEXTOUCH)  200 UNIT/ML SOPN Inject 220 Units into the skin daily. And pen needles 2/day 05/27/19   [name], MD  Insulin Syringe-Needle U-100 (INSULIN SYRINGE .3CC/29GX1/2") 29G X 1/2" 0.3 ML MISC Inject 1 Syringe 3 (three) times daily as directed. Check blood sugar three times daily. Dx: E11.9 05/10/17   [name], DO  lisinopril (PRINIVIL,ZESTRIL) 10 MG tablet Take 1 tablet (10 mg total) by mouth at bedtime. 09/27/18   Larey Dresser, MD  nitroGLYCERIN (NITROSTAT) 0.4 MG SL tablet Place 1 tablet (0.4 mg total) under the tongue every 5 (five) minutes as needed for chest pain. 10/27/17   [name], DO    Family History Family History  Problem Relation Age of Onset  . Heart disease Mother   . Heart attack Mother 35  . Hypertension Mother   . Hyperlipidemia Mother   . Diabetes Mother   . Heart disease Father   . Heart attack Father 25  . Hypertension Father   . Hyperlipidemia Father   . Diabetes Father   . Heart attack Sister 40  . Colon cancer Neg Hx   . Stomach cancer Neg Hx   . Esophageal cancer Neg Hx   . Rectal cancer Neg Hx     Social History Social History   Tobacco Use  . Smoking status: Never Smoker  . Smokeless tobacco: Never Used  Substance Use Topics  . Alcohol use: No  . Drug use: No     Allergies   Patient has no known allergies.   Review of Systems Review of Systems  Constitutional: Positive for chills, diaphoresis and fever (feels "hot").  Respiratory: Positive for shortness of breath. Negative for cough.   Cardiovascular: Positive for chest pain.  Gastrointestinal: Negative for abdominal pain, diarrhea, nausea and vomiting.  Genitourinary: Negative for dysuria.  Musculoskeletal: Positive for arthralgias and myalgias.  Allergic/Immunologic: Positive for immunocompromised state.  Neurological: Positive for headaches.  All other systems reviewed and are negative.    Physical Exam Updated Vital Signs BP (!) 148/83   Pulse 79   Temp 98.5 F  (36.9 C)   Resp 19   SpO2 100%   Physical Exam Vitals signs and nursing note reviewed.  Constitutional:      General: He is not in acute distress.    Appearance: He is well-developed. He is not ill-appearing.  HENT:     Head: Normocephalic and atraumatic.  Eyes:     General: No scleral icterus.       Right eye: No discharge.        Left eye: No discharge.     Conjunctiva/sclera: Conjunctivae normal.     Pupils: Pupils are equal, round, and reactive to light.  Neck:     Musculoskeletal: Normal range of motion.  Cardiovascular:     Rate and Rhythm: Normal rate  and regular rhythm.  Pulmonary:     Effort: Pulmonary effort is normal. No respiratory distress.     Breath sounds: Normal breath sounds.  Abdominal:     General: There is no distension.     Palpations: Abdomen is soft.     Tenderness: There is no abdominal tenderness.  Musculoskeletal:     Comments: Tenderness over the R hip and leg. No focal tenderness. Able to range hip and knee without difficulty. 2+ DP pulse  Skin:    General: Skin is warm and dry.  Neurological:     Mental Status: He is alert and oriented to person, place, and time.  Psychiatric:        Behavior: Behavior normal.      ED Treatments / Results  Labs (all labs ordered are listed, but only abnormal results are displayed) Labs Reviewed  BASIC METABOLIC PANEL - Abnormal; Notable for the following components:      Result Value   Glucose, Bld 283 (*)    Creatinine, Ser 1.34 (*)    GFR calc non Af Amer 58 (*)    All other components within normal limits  NOVEL CORONAVIRUS, NAA (HOSP ORDER, SEND-OUT TO REF LAB; TAT 18-24 HRS)  CBC  D-DIMER, QUANTITATIVE (NOT AT [facility])  BRAIN NATRIURETIC PEPTIDE  URINALYSIS, ROUTINE W REFLEX MICROSCOPIC  POC SARS CORONAVIRUS 2 AG -  ED  TROPONIN I (HIGH SENSITIVITY)  TROPONIN I (HIGH SENSITIVITY)    EKG None  Radiology Dg Chest Portable 1 View  Result Date: 06/05/2019 CLINICAL DATA:  Chest pain and  shortness of breath. EXAM: PORTABLE CHEST 1 VIEW COMPARISON:  01/23/2019 FINDINGS: The heart size and mediastinal contours are within normal limits. Both lungs are clear. The visualized skeletal structures are unremarkable. IMPRESSION: Normal exam. Electronically Signed   By: Lorriane Shire M.D.   On: 06/05/2019 18:08   Dg Hip Unilat With Pelvis 1v Right  Result Date: 06/05/2019 CLINICAL DATA:  Right hip pain EXAM: DG HIP (WITH OR WITHOUT PELVIS) 1V RIGHT COMPARISON:  None. FINDINGS: There is no evidence of hip fracture or dislocation. There is no evidence of arthropathy or other focal bone abnormality. IMPRESSION: Negative. Electronically Signed   By: Rolm Baptise M.D.   On: 06/05/2019 20:36    Procedures Procedures (including critical care time)  Medications Ordered in ED Medications  sodium chloride flush (NS) 0.9 % injection 3 mL (3 mLs Intravenous Given 06/05/19 1953)  sodium chloride 0.9 % bolus 1,000 mL (0 mLs Intravenous Stopped 06/05/19 2146)  insulin aspart (novoLOG) injection 5 Units (5 Units Subcutaneous Given 06/05/19 2004)  acetaminophen (TYLENOL) tablet 650 mg (650 mg Oral Given 06/05/19 2014)     Initial Impression / Assessment and Plan / ED Course  I have reviewed the triage vital signs and the nursing notes.  Pertinent labs & imaging results that were available during my care of the patient were reviewed by me and considered in my medical decision making (see chart for details).  58 year old male presents with CP/SOB for 3 days with feeling hot/cold and having sweats. BP is mildly elevated but otherwise vitals are normal. EKG is SR. CXR is clear. CBC is normal. BMP is remarkable for hyperglycemia (283). Trop is 13. Will add BNP and D-dimer. Will obtain POC COVID test. Unclear etiology of symptoms.   COVID test is negative. R hip xray is negative. D-dimer is normal. 2nd trop is normal. BNP is normal. With reassuring work up can d/c home. He  has multiple similar presentations  and pain was thought to be MSK related. Advised f/u with PCP.   Tyrie Porzio was evaluated in Emergency Department on 06/05/2019 for the symptoms described in the history of present illness. He was evaluated in the context of the global COVID-19 pandemic, which necessitated consideration that the patient might be at risk for infection with the SARS-CoV-2 virus that causes COVID-19. Institutional protocols and algorithms that pertain to the evaluation of patients at risk for COVID-19 are in a state of rapid change based on information released by regulatory bodies including the CDC and federal and state organizations. These policies and algorithms were followed during the patient's care in the ED.  Final Clinical Impressions(s) / ED Diagnoses   Final diagnoses:  Atypical chest pain  Hyperglycemia    ED Discharge Orders    None       Recardo Evangelist, [name] 06/05/19 2206    Daleen Bo, MD 06/07/19 1205

## 2019-06-05 NOTE — Patient Outreach (Signed)
[facility]) Care Management  06/05/2019  Calvin [name] [birth year] [id number]   Telephone Assessment   Outreach attempt to patient to follow up from previous call. Spoke briefly with patient. He voiced that he was "just getting back into town and getting ready to head to ER". Patient voices that he is not feeling well and feels worse He reports that his blood sugars continue to run in the 400s. He states that he did not get his insulin as he was told it would be several hundreds of dollars out of pocket expense for him in which he can not afford to pay for it. Patient also states that he has been having some intermittent headaches and chest pain episdes.    Plan: RN CM ended call and advised patient to seek medical attention. RN CM will follow back up with patient at later time. RN CM will send notification to [facility] pharmacist regarding update on insulin.   Enzo Montgomery, RN,BSN,CCM [place] Management Telephonic Care Management Coordinator Direct Phone: [phone] Toll Free: [phone] Fax: [phone]

## 2019-06-06 ENCOUNTER — Ambulatory Visit: Payer: Medicare HMO | Admitting: Endocrinology

## 2019-06-06 ENCOUNTER — Other Ambulatory Visit: Payer: Self-pay

## 2019-06-06 NOTE — Patient Outreach (Signed)
[place] [facility]) Care Management  06/06/2019  Calvin [name] Apr 20, 1961 [id number]   Telephone Assessment  Follow up call to check on patient from previous call on yesterday. Patient states that he is feeling better. He did got to the ED to be evaluated and everything was ruled out and he was cleared to return home. He voices that he has has had no further episodes of chest pain or severe headaches. He is unable to recall what blood sugar this morning and not at home at present to look at meter. He voices that cbg was "better today." Patient states that he got a text message from [place] regarding his insulin that medication was out of stock and is unsure how long it will take for him to receive med. Otherwise, he voices he is doing fine and denies any RN CM needs or concerns at present.    THN CM Care Plan Problem One     Most Recent Value  Care Plan Problem One  Knowledge deficit related to disease process and mgmt of Diabetes  Role Documenting the Problem One  Care Management Telephonic Coordinator  Care Plan for Problem One  Active  [facility] Long Term Goal   Patient will report a lowering/decrease in A1C level of 11.0 over the next 90 days.  THN Long Term Goal Start Date  05/01/19  Interventions for Problem One Long Term Goal  RN CM reinforced diabetic education to pt, importance of med and diet adherence as well as cbg monitoring  THN CM Short Term Goal #1   Patient will be able to verbalize at 2-3 symptoms of hyperglycemia over the next 30days.  THN CM Short Term Goal #1 Start Date  05/01/19  Interventions for Short Term Goal #1  RNCM reinforced to pt. diabetic education and monitoring  THN CM Short Term Goal #2   Patient will report a decrease in sugary drinks intake daily over the next 30 days.  THN CM Short Term Goal #2 Start Date  05/01/19  Interventions for Short Term Goal #2  RN CM reviewed with pt. diabetic diet and importance of avoiding sugary drinks  THN CM  Short Term Goal #3  Patient will report adherence to med regimen as ordered by MD over the next 60 days.  THN CM Short Term Goal #3 Start Date  05/01/19  Interventions for Short Tern Goal #3  RN CM assessed for med adherence. RN CM coordinated and discussed case with [facility] Rx dept for further assistance.     THN CM Care Plan Problem Two     Most Recent Value  Care Plan Problem Two  Patient at risk for falls.  Role Documenting the Problem Two  Care Management Telephonic [place] for Problem Two  Active  THN Long Term Goal  Patient will report no falls in the home over the next 90 days.  THN Long Term Goal Start Date  05/01/19  [facility] CM Short Term Goal #1   Patient will report using his DME(cane/walker) in the home more frequently to decrease risk of falls over the next 30 days.  THN CM Short Term Goal #1 Start Date  05/01/19  Interventions for Short Term Goal #2   RNCM assessed for any falls and reviewed falls/safety measures with pt.      Plan: RN CM will discussed with patient next outreach within the month of January. Patient gave verbal consent and in agreement with RN CM follow up and  timeframe.  Patient aware that they may contact RN CM sooner for any issues or concerns. RN CM sent in basket message to assigned [facility] pharmacist to update on status of medication pickup.  Enzo Montgomery, RN,BSN,CCM [facility] Management Telephonic Care Management Coordinator Direct Phone: [phone] Toll Free: [phone] Fax: [phone]

## 2019-06-07 ENCOUNTER — Other Ambulatory Visit: Payer: Self-pay | Admitting: Pharmacist

## 2019-06-07 ENCOUNTER — Ambulatory Visit: Payer: Self-pay | Admitting: Pharmacist

## 2019-06-07 LAB — NOVEL CORONAVIRUS, NAA (HOSP ORDER, SEND-OUT TO REF LAB; TAT 18-24 HRS): SARS-CoV-2, NAA: NOT DETECTED

## 2019-06-07 NOTE — Patient Outreach (Signed)
[place] [facility]) Care Management  [place] 06/07/2019  Erwin Nishiyama 1961-01-18 [id number]  Information received from [place]:   1)Patient has Partial Extra Help, NOT Full Extra Help however he appears to be in the catastrophic stage 4.  If this is correct, brand name medications should be $8.95 and generic $3.60 for the rest of 2020.  They will continue to look into why [place] co-pay not aligning with this.   2)Suggests that patient request a Tier Reduction for Eliquis [phone]) however patient able to afford this co-pay.  3)Suggests re-applying for Extra Help for re-assessment of Partial vs Full Extra Help.    Successful call with patient.  Patient reports he was told that [name] had to be ordered from [facility] and would be available for pick-up on Monday.  This is the free supply from [employer].  He states he picked up Repatha last week but was not able to pick up Eliquis because he was told that the cost would be $45.  He is trying to switch insurance plans for 2021 but has not yet called [employer] to request information on alternative plans.  He states he is going to call today.    3-way call to [place] to call in Eliquis RX again.  Co-pay is $8.95 / 90 DS.  Patient will pick up later today.    Plan: Will f/u again with patient next week re: picking pu free supply of Tresiba insulin Will continue to communicate with [employer] regarding co-pay of Eliseo Gum, PharmD, [place] [phone]

## 2019-06-10 ENCOUNTER — Ambulatory Visit: Payer: Self-pay | Admitting: Pharmacist

## 2019-06-11 ENCOUNTER — Other Ambulatory Visit: Payer: Self-pay | Admitting: Pharmacist

## 2019-06-11 ENCOUNTER — Ambulatory Visit: Payer: Self-pay | Admitting: Pharmacist

## 2019-06-11 ENCOUNTER — Other Ambulatory Visit: Payer: Self-pay | Admitting: Endocrinology

## 2019-06-11 MED ORDER — TRESIBA FLEXTOUCH 200 UNIT/ML ~~LOC~~ SOPN: 220.0000 [IU] | PEN_INJECTOR | Freq: Every day | SUBCUTANEOUS | 3 refills | Status: DC

## 2019-06-11 NOTE — Patient Outreach (Signed)
[place] [facility]) Care Management  [place] 06/11/2019  Calvin [name] 01-15-1961 [id number]  Reason for call: f/u on Tresiba insulin  Successful call with patient.  He reports he picked up [place] from pharmacy and took first dose today.  He could not recall dose but agreed when I read directions (220 units).  He states he is feeling fine so far.  I encouraged him to check his CBGs through out the day to monitor response as this insulin is more concentrated.  Patient stated he would try to check CBGs.  He also reported he picked up Eliquis and has all his medications at home currently.  He denies needing EMT to come fill his pillbox this week as he reports he has had exposure with a person with COVID and is going to get tested today. Patient reports he signed up for a new Medicare plan in 2021, thinks it is [facility].   Plan: Will f/u with patient next week re: insulin control   Ralene Bathe, PharmD, [place] [phone]

## 2019-06-13 ENCOUNTER — Other Ambulatory Visit (HOSPITAL_COMMUNITY): Payer: Self-pay

## 2019-06-13 ENCOUNTER — Other Ambulatory Visit: Payer: Self-pay | Admitting: Endocrinology

## 2019-06-13 MED ORDER — TRESIBA FLEXTOUCH 200 UNIT/ML ~~LOC~~ SOPN: 220.0000 [IU] | PEN_INJECTOR | Freq: Every day | SUBCUTANEOUS | 3 refills | Status: DC

## 2019-06-13 NOTE — Progress Notes (Signed)
--  created in error-- Should be a telephone visit-- Pt called me to cancel home visit today due to him awaiting COVID test results-he has family members that have been sick in the household.  Will continue to follow.   Marylouise Stacks, EMT-Paramedic  06/13/19

## 2019-06-17 ENCOUNTER — Emergency Department (HOSPITAL_COMMUNITY): Payer: Medicare HMO

## 2019-06-17 ENCOUNTER — Inpatient Hospital Stay (HOSPITAL_COMMUNITY)
Admission: EM | Admit: 2019-06-17 | Discharge: 2019-07-01 | DRG: 177 | Disposition: A | Discharge status: Home Health | Payer: Medicare HMO | Attending: Internal Medicine | Admitting: Internal Medicine

## 2019-06-17 ENCOUNTER — Observation Stay (HOSPITAL_COMMUNITY): Payer: Medicare HMO

## 2019-06-17 ENCOUNTER — Encounter (HOSPITAL_COMMUNITY): Payer: Self-pay | Admitting: Emergency Medicine

## 2019-06-17 DIAGNOSIS — I10 Essential (primary) hypertension: Secondary | ICD-10-CM | POA: Diagnosis not present

## 2019-06-17 DIAGNOSIS — N179 Acute kidney failure, unspecified: Secondary | ICD-10-CM

## 2019-06-17 DIAGNOSIS — J9601 Acute respiratory failure with hypoxia: Secondary | ICD-10-CM | POA: Diagnosis not present

## 2019-06-17 DIAGNOSIS — I480 Paroxysmal atrial fibrillation: Secondary | ICD-10-CM | POA: Diagnosis present

## 2019-06-17 DIAGNOSIS — I252 Old myocardial infarction: Secondary | ICD-10-CM

## 2019-06-17 DIAGNOSIS — E1165 Type 2 diabetes mellitus with hyperglycemia: Secondary | ICD-10-CM | POA: Diagnosis present

## 2019-06-17 DIAGNOSIS — R296 Repeated falls: Secondary | ICD-10-CM | POA: Diagnosis not present

## 2019-06-17 DIAGNOSIS — R531 Weakness: Secondary | ICD-10-CM

## 2019-06-17 DIAGNOSIS — E1142 Type 2 diabetes mellitus with diabetic polyneuropathy: Secondary | ICD-10-CM | POA: Diagnosis present

## 2019-06-17 DIAGNOSIS — N1831 Chronic kidney disease, stage 3a: Secondary | ICD-10-CM | POA: Diagnosis present

## 2019-06-17 DIAGNOSIS — M5126 Other intervertebral disc displacement, lumbar region: Secondary | ICD-10-CM | POA: Diagnosis not present

## 2019-06-17 DIAGNOSIS — K219 Gastro-esophageal reflux disease without esophagitis: Secondary | ICD-10-CM | POA: Diagnosis present

## 2019-06-17 DIAGNOSIS — I5043 Acute on chronic combined systolic (congestive) and diastolic (congestive) heart failure: Secondary | ICD-10-CM | POA: Diagnosis present

## 2019-06-17 DIAGNOSIS — J9600 Acute respiratory failure, unspecified whether with hypoxia or hypercapnia: Secondary | ICD-10-CM | POA: Diagnosis not present

## 2019-06-17 DIAGNOSIS — E1122 Type 2 diabetes mellitus with diabetic chronic kidney disease: Secondary | ICD-10-CM | POA: Diagnosis present

## 2019-06-17 DIAGNOSIS — J1289 Other viral pneumonia: Secondary | ICD-10-CM | POA: Diagnosis present

## 2019-06-17 DIAGNOSIS — E1159 Type 2 diabetes mellitus with other circulatory complications: Secondary | ICD-10-CM

## 2019-06-17 DIAGNOSIS — I5022 Chronic systolic (congestive) heart failure: Secondary | ICD-10-CM | POA: Diagnosis not present

## 2019-06-17 DIAGNOSIS — I2510 Atherosclerotic heart disease of native coronary artery without angina pectoris: Secondary | ICD-10-CM | POA: Diagnosis present

## 2019-06-17 DIAGNOSIS — R059 Cough, unspecified: Secondary | ICD-10-CM

## 2019-06-17 DIAGNOSIS — Z794 Long term (current) use of insulin: Secondary | ICD-10-CM

## 2019-06-17 DIAGNOSIS — T380X5A Adverse effect of glucocorticoids and synthetic analogues, initial encounter: Secondary | ICD-10-CM | POA: Diagnosis not present

## 2019-06-17 DIAGNOSIS — G4733 Obstructive sleep apnea (adult) (pediatric): Secondary | ICD-10-CM | POA: Diagnosis present

## 2019-06-17 DIAGNOSIS — R5381 Other malaise: Secondary | ICD-10-CM | POA: Diagnosis not present

## 2019-06-17 DIAGNOSIS — E785 Hyperlipidemia, unspecified: Secondary | ICD-10-CM | POA: Diagnosis present

## 2019-06-17 DIAGNOSIS — R0902 Hypoxemia: Secondary | ICD-10-CM | POA: Diagnosis not present

## 2019-06-17 DIAGNOSIS — Z86010 Personal history of colonic polyps: Secondary | ICD-10-CM

## 2019-06-17 DIAGNOSIS — R05 Cough: Secondary | ICD-10-CM | POA: Diagnosis not present

## 2019-06-17 DIAGNOSIS — Z8719 Personal history of other diseases of the digestive system: Secondary | ICD-10-CM

## 2019-06-17 DIAGNOSIS — Z8249 Family history of ischemic heart disease and other diseases of the circulatory system: Secondary | ICD-10-CM | POA: Diagnosis not present

## 2019-06-17 DIAGNOSIS — U071 COVID-19: Secondary | ICD-10-CM | POA: Diagnosis present

## 2019-06-17 DIAGNOSIS — I428 Other cardiomyopathies: Secondary | ICD-10-CM | POA: Diagnosis present

## 2019-06-17 DIAGNOSIS — G3184 Mild cognitive impairment, so stated: Secondary | ICD-10-CM | POA: Diagnosis present

## 2019-06-17 DIAGNOSIS — R0789 Other chest pain: Secondary | ICD-10-CM | POA: Diagnosis not present

## 2019-06-17 DIAGNOSIS — R29818 Other symptoms and signs involving the nervous system: Secondary | ICD-10-CM | POA: Diagnosis not present

## 2019-06-17 DIAGNOSIS — R079 Chest pain, unspecified: Secondary | ICD-10-CM | POA: Diagnosis not present

## 2019-06-17 DIAGNOSIS — E1169 Type 2 diabetes mellitus with other specified complication: Secondary | ICD-10-CM | POA: Diagnosis present

## 2019-06-17 DIAGNOSIS — Z9989 Dependence on other enabling machines and devices: Secondary | ICD-10-CM

## 2019-06-17 DIAGNOSIS — Z79899 Other long term (current) drug therapy: Secondary | ICD-10-CM

## 2019-06-17 DIAGNOSIS — I130 Hypertensive heart and chronic kidney disease with heart failure and stage 1 through stage 4 chronic kidney disease, or unspecified chronic kidney disease: Secondary | ICD-10-CM | POA: Diagnosis present

## 2019-06-17 DIAGNOSIS — M546 Pain in thoracic spine: Secondary | ICD-10-CM | POA: Diagnosis not present

## 2019-06-17 DIAGNOSIS — B351 Tinea unguium: Secondary | ICD-10-CM | POA: Diagnosis present

## 2019-06-17 DIAGNOSIS — K5900 Constipation, unspecified: Secondary | ICD-10-CM | POA: Diagnosis not present

## 2019-06-17 DIAGNOSIS — Z833 Family history of diabetes mellitus: Secondary | ICD-10-CM

## 2019-06-17 DIAGNOSIS — R42 Dizziness and giddiness: Secondary | ICD-10-CM | POA: Diagnosis present

## 2019-06-17 DIAGNOSIS — R109 Unspecified abdominal pain: Secondary | ICD-10-CM

## 2019-06-17 DIAGNOSIS — Z8349 Family history of other endocrine, nutritional and metabolic diseases: Secondary | ICD-10-CM

## 2019-06-17 DIAGNOSIS — E119 Type 2 diabetes mellitus without complications: Secondary | ICD-10-CM

## 2019-06-17 DIAGNOSIS — G309 Alzheimer's disease, unspecified: Secondary | ICD-10-CM | POA: Diagnosis present

## 2019-06-17 DIAGNOSIS — S0990XA Unspecified injury of head, initial encounter: Secondary | ICD-10-CM | POA: Diagnosis not present

## 2019-06-17 DIAGNOSIS — Z7901 Long term (current) use of anticoagulants: Secondary | ICD-10-CM

## 2019-06-17 DIAGNOSIS — F067 Alzheimer's disease, unspecified: Secondary | ICD-10-CM | POA: Diagnosis present

## 2019-06-17 DIAGNOSIS — R0602 Shortness of breath: Secondary | ICD-10-CM | POA: Diagnosis not present

## 2019-06-17 DIAGNOSIS — E1140 Type 2 diabetes mellitus with diabetic neuropathy, unspecified: Secondary | ICD-10-CM | POA: Diagnosis present

## 2019-06-17 HISTORY — DX: Weakness: R53.1

## 2019-06-17 HISTORY — DX: Acute kidney failure, unspecified: N17.9

## 2019-06-17 LAB — I-STAT CHEM 8, ED
BUN: 26 mg/dL — ABNORMAL HIGH (ref 6–20)
Calcium, Ion: 1.06 mmol/L — ABNORMAL LOW (ref 1.15–1.40)
Chloride: 99 mmol/L (ref 98–111)
Creatinine, Ser: 1.5 mg/dL — ABNORMAL HIGH (ref 0.61–1.24)
Glucose, Bld: 222 mg/dL — ABNORMAL HIGH (ref 70–99)
HCT: 52 % (ref 39.0–52.0)
Hemoglobin: 17.7 g/dL — ABNORMAL HIGH (ref 13.0–17.0)
Potassium: 4.2 mmol/L (ref 3.5–5.1)
Sodium: 135 mmol/L (ref 135–145)
TCO2: 26 mmol/L (ref 22–32)

## 2019-06-17 LAB — URINALYSIS, ROUTINE W REFLEX MICROSCOPIC
Bilirubin Urine: NEGATIVE
Glucose, UA: >=500 mg/dL — AB
Ketones, ur: NEGATIVE mg/dL
Leukocytes,Ua: NEGATIVE
Nitrite: NEGATIVE
Protein, ur: 100 mg/dL — AB
Specific Gravity, Urine: 1.023 (ref 1.005–1.030)
pH: 5 (ref 5.0–8.0)

## 2019-06-17 LAB — COMPREHENSIVE METABOLIC PANEL
ALT: 16 U/L (ref 0–44)
AST: 23 U/L (ref 15–41)
Albumin: 3.4 g/dL — ABNORMAL LOW (ref 3.5–5.0)
Alkaline Phosphatase: 66 U/L (ref 38–126)
Anion gap: 14 (ref 5–15)
BUN: 21 mg/dL — ABNORMAL HIGH (ref 6–20)
CO2: 22 mmol/L (ref 22–32)
Calcium: 8.6 mg/dL — ABNORMAL LOW (ref 8.9–10.3)
Chloride: 97 mmol/L — ABNORMAL LOW (ref 98–111)
Creatinine, Ser: 1.65 mg/dL — ABNORMAL HIGH (ref 0.61–1.24)
GFR calc Af Amer: 52 mL/min — ABNORMAL LOW (ref >60)
GFR calc non Af Amer: 45 mL/min — ABNORMAL LOW (ref >60)
Glucose, Bld: 254 mg/dL — ABNORMAL HIGH (ref 70–99)
Potassium: 4 mmol/L (ref 3.5–5.1)
Sodium: 133 mmol/L — ABNORMAL LOW (ref 135–145)
Total Bilirubin: 0.6 mg/dL (ref 0.3–1.2)
Total Protein: 7.2 g/dL (ref 6.5–8.1)

## 2019-06-17 LAB — TROPONIN I (HIGH SENSITIVITY)
Troponin I (High Sensitivity): 29 ng/L — ABNORMAL HIGH (ref <18)
Troponin I (High Sensitivity): 30 ng/L — ABNORMAL HIGH (ref <18)

## 2019-06-17 LAB — PROTIME-INR
INR: 1 (ref 0.8–1.2)
Prothrombin Time: 12.8 seconds (ref 11.4–15.2)

## 2019-06-17 LAB — CBC
HCT: 47.6 % (ref 39.0–52.0)
Hemoglobin: 16.3 g/dL (ref 13.0–17.0)
MCH: 30.5 pg (ref 26.0–34.0)
MCHC: 34.2 g/dL (ref 30.0–36.0)
MCV: 89.1 fL (ref 80.0–100.0)
Platelets: 142 10*3/uL — ABNORMAL LOW (ref 150–400)
RBC: 5.34 MIL/uL (ref 4.22–5.81)
RDW: 11.6 % (ref 11.5–15.5)
WBC: 5.4 10*3/uL (ref 4.0–10.5)
nRBC: 0 % (ref 0.0–0.2)

## 2019-06-17 LAB — DIFFERENTIAL
Abs Immature Granulocytes: 0.02 10*3/uL (ref 0.00–0.07)
Basophils Absolute: 0 10*3/uL (ref 0.0–0.1)
Basophils Relative: 0 %
Eosinophils Absolute: 0 10*3/uL (ref 0.0–0.5)
Eosinophils Relative: 0 %
Immature Granulocytes: 0 %
Lymphocytes Relative: 20 %
Lymphs Abs: 1.1 10*3/uL (ref 0.7–4.0)
Monocytes Absolute: 0.4 10*3/uL (ref 0.1–1.0)
Monocytes Relative: 7 %
Neutro Abs: 3.9 10*3/uL (ref 1.7–7.7)
Neutrophils Relative %: 73 %

## 2019-06-17 LAB — SEDIMENTATION RATE: Sed Rate: 34 mm/hr — ABNORMAL HIGH (ref 0–16)

## 2019-06-17 LAB — MAGNESIUM: Magnesium: 2.4 mg/dL (ref 1.7–2.4)

## 2019-06-17 LAB — RAPID URINE DRUG SCREEN, HOSP PERFORMED
Amphetamines: NOT DETECTED
Barbiturates: NOT DETECTED
Benzodiazepines: NOT DETECTED
Cocaine: NOT DETECTED
Opiates: NOT DETECTED
Tetrahydrocannabinol: NOT DETECTED

## 2019-06-17 LAB — ETHANOL: Alcohol, Ethyl (B): <10 mg/dL (ref <10)

## 2019-06-17 LAB — CBG MONITORING, ED
Glucose-Capillary: 184 mg/dL — ABNORMAL HIGH (ref 70–99)
Glucose-Capillary: 188 mg/dL — ABNORMAL HIGH (ref 70–99)
Glucose-Capillary: 213 mg/dL — ABNORMAL HIGH (ref 70–99)

## 2019-06-17 LAB — PHOSPHORUS: Phosphorus: 3.1 mg/dL (ref 2.5–4.6)

## 2019-06-17 LAB — APTT: aPTT: 32 seconds (ref 24–36)

## 2019-06-17 LAB — TSH
TSH: 1.854 u[IU]/mL (ref 0.350–4.500)
TSH: 2.428 u[IU]/mL (ref 0.350–4.500)

## 2019-06-17 LAB — VITAMIN B12: Vitamin B-12: 315 pg/mL (ref 180–914)

## 2019-06-17 LAB — URIC ACID: Uric Acid, Serum: 6.3 mg/dL (ref 3.7–8.6)

## 2019-06-17 LAB — HIV ANTIBODY (ROUTINE TESTING W REFLEX): HIV Screen 4th Generation wRfx: NONREACTIVE

## 2019-06-17 LAB — PROCALCITONIN: Procalcitonin: 0.35 ng/mL

## 2019-06-17 LAB — CK: Total CK: 224 U/L (ref 49–397)

## 2019-06-17 LAB — VITAMIN D 25 HYDROXY (VIT D DEFICIENCY, FRACTURES): Vit D, 25-Hydroxy: 8.99 ng/mL — ABNORMAL LOW (ref 30–100)

## 2019-06-17 LAB — HEMOGLOBIN A1C
Hgb A1c MFr Bld: 11.9 % — ABNORMAL HIGH (ref 4.8–5.6)
Mean Plasma Glucose: 294.83 mg/dL

## 2019-06-17 LAB — SARS CORONAVIRUS 2 (TAT 6-24 HRS): SARS Coronavirus 2: POSITIVE — AB

## 2019-06-17 LAB — BRAIN NATRIURETIC PEPTIDE: B Natriuretic Peptide: 84.1 pg/mL (ref 0.0–100.0)

## 2019-06-17 LAB — C-REACTIVE PROTEIN: CRP: 16.5 mg/dL — ABNORMAL HIGH (ref <1.0)

## 2019-06-17 MED ORDER — INSULIN ASPART 100 UNIT/ML ~~LOC~~ SOLN
0.0000 [IU] | Freq: Every day | SUBCUTANEOUS | Status: DC
  Administered 2019-06-17: 2 [IU] via SUBCUTANEOUS
  Administered 2019-06-20 (×2): 3 [IU] via SUBCUTANEOUS
  Administered 2019-06-23 – 2019-06-25 (×2): 2 [IU] via SUBCUTANEOUS
  Administered 2019-06-26: 5 [IU] via SUBCUTANEOUS

## 2019-06-17 MED ORDER — INSULIN ASPART 100 UNIT/ML ~~LOC~~ SOLN
3.0000 [IU] | Freq: Three times a day (TID) | SUBCUTANEOUS | Status: DC
  Administered 2019-06-18 – 2019-06-25 (×12): 3 [IU] via SUBCUTANEOUS

## 2019-06-17 MED ORDER — ONDANSETRON HCL 4 MG PO TABS
4.0000 mg | ORAL_TABLET | Freq: Four times a day (QID) | ORAL | Status: DC | PRN
  Administered 2019-06-18: 4 mg via ORAL
  Filled 2019-06-17: qty 1

## 2019-06-17 MED ORDER — APIXABAN 5 MG PO TABS
5.0000 mg | ORAL_TABLET | Freq: Two times a day (BID) | ORAL | Status: DC
  Administered 2019-06-17 – 2019-07-01 (×28): 5 mg via ORAL
  Filled 2019-06-17 (×29): qty 1

## 2019-06-17 MED ORDER — ONDANSETRON HCL 4 MG/2ML IJ SOLN
4.0000 mg | Freq: Four times a day (QID) | INTRAMUSCULAR | Status: DC | PRN
  Administered 2019-06-19: 4 mg via INTRAVENOUS
  Filled 2019-06-17: qty 2

## 2019-06-17 MED ORDER — SENNOSIDES-DOCUSATE SODIUM 8.6-50 MG PO TABS: 2.0000 | ORAL_TABLET | Freq: Every evening | ORAL | Status: DC | PRN

## 2019-06-17 MED ORDER — ACETAMINOPHEN 650 MG RE SUPP: 650.0000 mg | Freq: Four times a day (QID) | RECTAL | Status: DC | PRN

## 2019-06-17 MED ORDER — GADOBUTROL 1 MMOL/ML IV SOLN
7.0000 mL | Freq: Once | INTRAVENOUS | Status: AC | PRN
  Administered 2019-06-17: 7 mL via INTRAVENOUS

## 2019-06-17 MED ORDER — DOCUSATE SODIUM 100 MG PO CAPS
100.0000 mg | ORAL_CAPSULE | Freq: Two times a day (BID) | ORAL | Status: DC
  Administered 2019-06-19 – 2019-06-30 (×23): 100 mg via ORAL
  Filled 2019-06-17 (×27): qty 1

## 2019-06-17 MED ORDER — ACETAMINOPHEN 325 MG PO TABS
650.0000 mg | ORAL_TABLET | Freq: Four times a day (QID) | ORAL | Status: DC | PRN
  Administered 2019-06-18 – 2019-06-21 (×3): 650 mg via ORAL
  Filled 2019-06-17 (×4): qty 2

## 2019-06-17 MED ORDER — INSULIN GLARGINE 100 UNIT/ML ~~LOC~~ SOLN
20.0000 [IU] | Freq: Every day | SUBCUTANEOUS | Status: DC
  Administered 2019-06-17 – 2019-06-24 (×7): 20 [IU] via SUBCUTANEOUS
  Filled 2019-06-17 (×9): qty 0.2

## 2019-06-17 MED ORDER — POLYETHYLENE GLYCOL 3350 17 G PO PACK: 17.0000 g | PACK | Freq: Every day | ORAL | Status: DC | PRN

## 2019-06-17 MED ORDER — INSULIN ASPART 100 UNIT/ML ~~LOC~~ SOLN
0.0000 [IU] | Freq: Three times a day (TID) | SUBCUTANEOUS | Status: DC
  Administered 2019-06-17: 20:00:00 3 [IU] via SUBCUTANEOUS
  Administered 2019-06-18: 2 [IU] via SUBCUTANEOUS
  Administered 2019-06-18: 100 [IU] via SUBCUTANEOUS
  Administered 2019-06-18: 2 [IU] via SUBCUTANEOUS
  Administered 2019-06-19 – 2019-06-20 (×4): 3 [IU] via SUBCUTANEOUS
  Administered 2019-06-20 – 2019-06-21 (×2): 2 [IU] via SUBCUTANEOUS
  Administered 2019-06-21: 3 [IU] via SUBCUTANEOUS
  Administered 2019-06-22: 5 [IU] via SUBCUTANEOUS
  Administered 2019-06-22: 11 [IU] via SUBCUTANEOUS
  Administered 2019-06-23: 8 [IU] via SUBCUTANEOUS
  Administered 2019-06-23: 3 [IU] via SUBCUTANEOUS
  Administered 2019-06-24: 5 [IU] via SUBCUTANEOUS
  Administered 2019-06-24: 11 [IU] via SUBCUTANEOUS
  Administered 2019-06-24 – 2019-06-26 (×3): 5 [IU] via SUBCUTANEOUS
  Administered 2019-06-26: 8 [IU] via SUBCUTANEOUS
  Administered 2019-06-26 – 2019-06-27 (×2): 0 [IU] via SUBCUTANEOUS

## 2019-06-17 MED ORDER — SODIUM CHLORIDE 0.9 % IV SOLN
INTRAVENOUS | Status: AC
  Administered 2019-06-17: 19:00:00 via INTRAVENOUS

## 2019-06-17 NOTE — ED Notes (Signed)
RN called Sister on contact list with patient verbal  Consent to inform of + test; Pt has personal cell at bedside to receive calls from family; Sister was updated on patient's status-Monique,RN

## 2019-06-17 NOTE — H&P (Signed)
History and Physical    Calvin [name] YBO:[id number] DOB: [birth year] DOA: 06/17/2019  PCP: [name], NP Patient coming from: Home   Chief Complaint: Right sided weakness  HPI: Calvin [name] is a 58 y.o. 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 fib on Eliquis, obstructive sleep apnea, HLD came to the hospital with complaints of right-sided weakness of upper and lower extremity has started 2 days ago.  Patient noticed this along with feelings of generalized fatigue, at first he thought it would improve but because it persisted he eventually came to the ER for further evaluation.  Typically does not use any assistance to ambulate but he has been requiring extra support over last 2 days.  Resides at home with a roommate.  Denies any fevers, chills, shortness of breath, cough or COVID-19 exposure.  Admits of medication compliance including Eliquis.  In the ER CT of his head was negative.  Basic lab work was overall unremarkable besides mild AKI and elevated inflammatory markers-CRP/sed rate.  Medical team requested to admit the patient  He was in the ED couple of weeks ago with complains of nonspecific chest pain and shortness of breath-basic work-up was negative including D-dimer therefore he was sent home after diagnosis of musculoskeletal chest pain.   Review of Systems: As per HPI otherwise 10 point review of systems negative.  Review of Systems Otherwise negative except as per HPI, including: General: Denies fever, chills, night sweats or unintended weight loss. Resp: Denies cough, wheezing, shortness of breath. Cardiac: Denies chest pain, palpitations, orthopnea, paroxysmal nocturnal dyspnea. GI: Denies abdominal pain, nausea, vomiting, diarrhea or constipation GU: Denies dysuria, frequency, hesitancy or incontinence MS: Denies muscle aches, joint pain or swelling Neuro:  Denies headache Psych: Denies anxiety, depression, SI/HI/AVH Skin: Denies new rashes or lesions ID: Denies sick contacts, exotic exposures, travel  Past Medical History:  Diagnosis Date  . Adenoidal hypertrophy   . Adenomatous colon polyps 2012  . Arthritis   . Diabetes mellitus   . GERD (gastroesophageal reflux disease)   . Heart attack ([place])    [place] living in [place]  [name] Hx of adenomatous colonic polyps 08/18/2017  . Hyperlipidemia   . Hypertension   . Mild CAD    a. cath in 08/2017 showing mild nonobstructive CAD with scattered 20-30% stenosis.   . Nonischemic cardiomyopathy ([place])    a. EF 20-25% by echo in 09/2017 with cath showing mild CAD. b.  Last echo 12/2017 EF 35-40%, grade 2 DD.  [name] Obesity   . PAF (paroxysmal atrial fibrillation) ([place])   . Sleep apnea    cpap, pt says no longer has    Past Surgical History:  Procedure Laterality Date  . COLONOSCOPY  05/13/11   9 adenomas  . FOREARM SURGERY    . MUSCLE BIOPSY    . RIGHT/LEFT HEART CATH AND CORONARY ANGIOGRAPHY N/A 08/25/2017   Procedure: RIGHT/LEFT HEART CATH AND CORONARY ANGIOGRAPHY;  Surgeon: [name], MD;  Location: [facility];  Service: Cardiovascular;  Laterality: N/A;    SOCIAL HISTORY:  reports that he has never smoked. He has never used smokeless tobacco. He reports that he does not drink alcohol or use drugs.  No Known Allergies  FAMILY HISTORY: Family History  Problem Relation Age of Onset  . Heart disease Mother   . Heart attack Mother 51  . Hypertension Mother   . Hyperlipidemia Mother   . Diabetes Mother   .  Heart disease Father   . Heart attack Father 56  . Hypertension Father   . Hyperlipidemia Father   . Diabetes Father   . Heart attack Sister 34  . Colon cancer Neg Hx   . Stomach cancer Neg Hx   . Esophageal cancer Neg Hx   . Rectal cancer Neg Hx      Prior to Admission medications   Medication Sig Start Date End Date Taking? Authorizing Provider  amitriptyline  (ELAVIL) 10 MG tablet Take 1 tablet at bed time for one week and then increase 2 tablets at bed time 05/14/19   Cameron Sprang, MD  apixaban (ELIQUIS) 5 MG TABS tablet Take 5 mg by mouth 2 (two) times daily.    [provider]  carvedilol (COREG) 6.25 MG tablet Take 1 tablet (6.25 mg total) by mouth 2 (two) times daily with a meal. 09/27/18   Larey Dresser, MD  donepezil (ARICEPT) 10 MG tablet Take 1 tablet (10 mg total) by mouth at bedtime. 09/18/18   Cameron Sprang, MD  Evolocumab (REPATHA) 140 MG/ML SOSY Inject into the skin every 14 (fourteen) days.    [provider]  ezetimibe (ZETIA) 10 MG tablet Take 1 tablet (10 mg total) by mouth daily. 04/11/19   [name], MD  glucose blood (CONTOUR TEST) test strip 1 each by Other route 2 (two) times a day. 02/01/19   [name], MD  Insulin Degludec (TRESIBA FLEXTOUCH) 200 UNIT/ML SOPN Inject 220 Units into the skin daily. And pen needles 2/day 06/13/19   [name], MD  Insulin Syringe-Needle U-100 (INSULIN SYRINGE .3CC/29GX1/2") 29G X 1/2" 0.3 ML MISC Inject 1 Syringe 3 (three) times daily as directed. Check blood sugar three times daily. Dx: E11.9 05/10/17   [name], DO  lisinopril (PRINIVIL,ZESTRIL) 10 MG tablet Take 1 tablet (10 mg total) by mouth at bedtime. 09/27/18   Larey Dresser, MD  nitroGLYCERIN (NITROSTAT) 0.4 MG SL tablet Place 1 tablet (0.4 mg total) under the tongue every 5 (five) minutes as needed for chest pain. 10/27/17   [name], DO    Physical Exam: Vitals:   06/17/19 1119 06/17/19 1120 06/17/19 1130 06/17/19 1200  BP:  (!) 154/77 (!) 164/82 (!) 146/93  Pulse:  100 (!) 102   Resp:   (!) 33 (!) 23  Temp:  98 F (36.7 C)    TempSrc:  Oral    SpO2: 100% 99% 100%       Constitutional: NAD, calm, comfortable Eyes: PERRL, lids and conjunctivae normal ENMT: Mucous membranes are moist. Posterior pharynx clear of any exudate or lesions.Normal dentition.  Neck: normal, supple,  no masses, no thyromegaly Respiratory: Very minimal bibasilar crackles Cardiovascular: Regular rate and rhythm, no murmurs / rubs / gallops. No extremity edema. 2+ pedal pulses. No carotid bruits.  Abdomen: no tenderness, no masses palpated. No hepatosplenomegaly. Bowel sounds positive.  Musculoskeletal: no clubbing / cyanosis. No joint deformity upper and lower extremities. Good ROM, no contractures. Normal muscle tone.  Skin: no rashes, lesions, ulcers. No induration Neurologic: CN 2-12 grossly intact. Sensation intact, DTR normal.  Right-sided strength 4/5, left-sided strength 5/5 both in upper and lower extremities Psychiatric: Somewhat poor judgment and insight. Alert and oriented x 3. Normal mood.     Labs on Admission: I have personally reviewed following labs and imaging studies  CBC: Recent Labs  Lab 06/17/19 1240  WBC 5.4  NEUTROABS 3.9  HGB 16.3  HCT 47.6  MCV 89.1  PLT 016*   Basic Metabolic Panel: Recent Labs  Lab 06/17/19 1240  NA 133*  K 4.0  CL 97*  CO2 22  GLUCOSE 254*  BUN 21*  CREATININE 1.65*  CALCIUM 8.6*  MG 2.4  PHOS 3.1   GFR: CrCl cannot be calculated (Unknown ideal weight.). Liver Function Tests: Recent Labs  Lab 06/17/19 1240  AST 23  ALT 16  ALKPHOS 66  BILITOT 0.6  PROT 7.2  ALBUMIN 3.4*   No results for input(s): LIPASE, AMYLASE in the last 168 hours. No results for input(s): AMMONIA in the last 168 hours. Coagulation Profile: Recent Labs  Lab 06/17/19 1240  INR 1.0   Cardiac Enzymes: No results for input(s): CKTOTAL, CKMB, CKMBINDEX, TROPONINI in the last 168 hours. BNP (last 3 results) No results for input(s): PROBNP in the last 8760 hours. HbA1C: No results for input(s): HGBA1C in the last 72 hours. CBG: No results for input(s): GLUCAP in the last 168 hours. Lipid Profile: No results for input(s): CHOL, HDL, LDLCALC, TRIG, CHOLHDL, LDLDIRECT in the last 72 hours. Thyroid Function Tests: Recent Labs     06/17/19 1300  TSH 1.854   Anemia Panel: No results for input(s): VITAMINB12, FOLATE, FERRITIN, TIBC, IRON, RETICCTPCT in the last 72 hours. Urine analysis:    Component Value Date/Time   COLORURINE YELLOW 06/17/2019 1600   APPEARANCEUR HAZY (A) 06/17/2019 1600   LABSPEC 1.023 06/17/2019 1600   PHURINE 5.0 06/17/2019 1600   GLUCOSEU >=500 (A) 06/17/2019 1600   HGBUR MODERATE (A) 06/17/2019 1600   BILIRUBINUR NEGATIVE 06/17/2019 1600   KETONESUR NEGATIVE 06/17/2019 1600   PROTEINUR 100 (A) 06/17/2019 1600   UROBILINOGEN 0.2 11/28/2012 1857   NITRITE NEGATIVE 06/17/2019 1600   LEUKOCYTESUR NEGATIVE 06/17/2019 1600   Sepsis Labs: !!!!!!!!!!!!!!!!!!!!!!!!!!!!!!!!!!!!!!!!!!!! _0 (procalcitonin:4,lacticidven:4) )No results found for this or any previous visit (from the past 240 hour(s)).   Radiological Exams on Admission: CT HEAD WO CONTRAST  Result Date: 06/17/2019 CLINICAL DATA:  Right-sided weakness, 2 days duration. Recent falling. EXAM: CT HEAD WITHOUT CONTRAST TECHNIQUE: Contiguous axial images were obtained from the base of the skull through the vertex without intravenous contrast. COMPARISON:  MRI 10/14/2017 FINDINGS: Brain: The brain shows a normal appearance without evidence of malformation, atrophy, old or acute small or large vessel infarction, mass lesion, hemorrhage, hydrocephalus or extra-axial collection. Vascular: There is atherosclerotic calcification of the major vessels at the base of the brain. Skull: Normal.  No traumatic finding.  No focal bone lesion. Sinuses/Orbits: Sinuses are clear. Orbits appear normal. Mastoids are clear. Other: None significant IMPRESSION: Normal head CT except for atherosclerotic calcification of the major vessels at the base of the brain. Electronically Signed   By: Nelson Chimes M.D.   On: 06/17/2019 14:41   DG Chest Port 1 View  Result Date: 06/17/2019 CLINICAL DATA:  Chest pain EXAM: PORTABLE CHEST 1 VIEW COMPARISON:  June 05, 2019 FINDINGS: The heart size and mediastinal contours are within normal limits. Both lungs are clear. The visualized skeletal structures are unremarkable. IMPRESSION: No active disease. Electronically Signed   By: Prudencio Pair M.D.   On: 06/17/2019 12:12     All images have been reviewed by me personally.  EKG: Independently reviewed.  V5 V6 T wave inversion.  Assessment/Plan Principal Problem:   Right sided weakness Active Problems:   DM (diabetes mellitus) (HCC)   HLD (hyperlipidemia)   Chronic systolic heart failure (HCC)   Essential hypertension   OSA on CPAP   Onychomycosis of  multiple toenails with type 2 diabetes mellitus and peripheral neuropathy (HCC)   MCI (mild cognitive impairment)   Diabetic neuropathy (HCC)   AF (paroxysmal atrial fibrillation) (HCC)   AKI (acute kidney injury) ([place])    Right-sided weakness, nonspecific -Admit for observation to Tele.  CT of the head negative -Question about medication compliance, in the setting of A. fib-we will obtain MRI brain to rule out CVA -Bedrest.  PT/OT. Neurochecks.  -Check TSH, B12, folate, vitamin D  Mild AKI on CKD stage IIIa -Baseline creatinine 1.3.  Admission creatinine 1.65.  Will provide very gentle hydration in the setting of CHF.  Monitor urine output.  Positive urine dipstick with glucosuria -No evidence of DKA.  No RBCs seen on UA -Check CK levels  Elevated ESR/CRP -Unsure of exact etiology.  No obvious evidence of infection.  Hold off on antibiotics. Can consider Pericarditis (CP x 2 weeks ago).  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  We will give him gentle hydration 75 cc/h.  Pending  med rec  Paroxysmal atrial fibrillation -Continue Eliquis.  Resume beta-blocker once confirmed by pharmacy  Hyperlipidemia -We will resume Zetia when confirmed by pharmacy  Essential hypertension -On Coreg and lisinopril  Currently awaiting pharmacy to complete preadmission med rec, if completed on time I will update it tonight.  Otherwise outpatient medications have been reviewed.  DVT prophylaxis: Eliquis Code Status: Full code Family Communication: None Disposition Plan: To be determined Consults called: None Admission status: Place patient in observation as he is undergoing evaluation for right-sided weakness, diagnosis is not specific at this time.  He will require at least 24-48-hour observation in the hospital.   Time Spent: 65 minutes.  >50% of the time was devoted to discussing the patients care, assessment, plan and disposition with other care givers along with counseling the patient about the risks and benefits of treatment.    Giavana Rooke Arsenio Loader MD [facility]  If 7PM-7AM, please contact night-coverage   06/17/2019, 4:53 PM

## 2019-06-17 NOTE — ED Triage Notes (Signed)
Patient here by [facility] with right-sided weakness X 2days and feeling "tired" Patient reported right chest in route per EMS  Patient says he has been falling for 2 days when he tries to walk sometimes walker/cane at baseline Neuro intact Equal grips

## 2019-06-17 NOTE — ED Notes (Signed)
Admitting notified of Covid + test results-Monique,RN

## 2019-06-17 NOTE — ED Provider Notes (Signed)
[facility] EMERGENCY DEPARTMENT Provider Note   CSN: [id number] Arrival date & time: 06/17/19  1114     History Chief Complaint  Patient presents with  . right sided weakness    Calvin [name] is a 58 y.o. male.  HPI Patient reports that he has weakness in his right leg.  He reports 2 days ago he got out of bed and the leg gave way and he collapsed to the floor.  He reports he has been using a cane or a walker to get around.  He reports the leg still feels weak to him compared to the left.  He does however report that he has had problems with the right lower extremity for up to a year.  He reports he does not know what the diagnosis is.  He states he has been prescribed a walker and a cane.  He indicates using them periodically.  He reports also, today he had a pain in his chest.  He reports this on the right side of his chest and then that pain radiates down his side and to his leg as well as into the right shoulder and arm.  He denies he has a headache.  He denies any blurred vision or double vision.  No fever, no cough.  No abdominal pain nausea or vomiting.  Patient denies any drug or alcohol use.  He reports he has periods whereupon he can use his leg without difficulty.  He could walk to the bus stop.  When symptoms resolve he does not have difficulty walking.  He reports this happens unexpectedly and suddenly his leg does not work.    Past Medical History:  Diagnosis Date  . Adenoidal hypertrophy   . Adenomatous colon polyps 2012  . Arthritis   . Diabetes mellitus   . GERD (gastroesophageal reflux disease)   . Heart attack ([place])    While living in [place]  [name] Hx of adenomatous colonic polyps 08/18/2017  . Hyperlipidemia   . Hypertension   . Mild CAD    a. cath in 08/2017 showing mild nonobstructive CAD with scattered 20-30% stenosis.   . Nonischemic cardiomyopathy ([place])    a. EF 20-25% by echo in 09/2017 with cath showing mild CAD. b.  Last echo 12/2017 EF  35-40%, grade 2 DD.  [name] Obesity   . PAF (paroxysmal atrial fibrillation) ([place])   . Sleep apnea    cpap, pt says no longer has    Patient Active Problem List   Diagnosis Date Noted  . Foot pain, bilateral 02/01/2019  . Abdominal pain 01/23/2019  . Chest pain 01/23/2019  . Right leg pain 12/20/2018  . Overgrown toenails 12/20/2018  . Unsteady gait 12/20/2018  . Encounter for therapeutic drug monitoring 10/22/2018  . Nausea and vomiting 03/10/2018  . AF (paroxysmal atrial fibrillation) ([place]) 03/10/2018  . Non-cardiac chest pain 03/09/2018  . Diabetic neuropathy ([place]) 09/26/2017  . MCI (mild cognitive impairment) 06/23/2017  . OSA on CPAP 05/10/2017  . Gastroesophageal reflux disease 05/10/2017  . Insomnia 05/10/2017  . Onychomycosis of multiple toenails with type 2 diabetes mellitus and peripheral neuropathy ([place]) 05/10/2017  . Chronic systolic heart failure ([place]) 11/22/2015  . Essential hypertension 11/22/2015  . DM (diabetes mellitus) ([place]) 11/20/2015  . HLD (hyperlipidemia) 11/20/2015  . Hx of adenomatous colonic polyps 05/19/2011    Past Surgical History:  Procedure Laterality Date  . COLONOSCOPY  05/13/11   9 adenomas  . FOREARM SURGERY    . MUSCLE  BIOPSY    . RIGHT/LEFT HEART CATH AND CORONARY ANGIOGRAPHY N/A 08/25/2017   Procedure: RIGHT/LEFT HEART CATH AND CORONARY ANGIOGRAPHY;  Surgeon: [name], MD;  Location: [facility];  Service: Cardiovascular;  Laterality: N/A;       Family History  Problem Relation Age of Onset  . Heart disease Mother   . Heart attack Mother 6  . Hypertension Mother   . Hyperlipidemia Mother   . Diabetes Mother   . Heart disease Father   . Heart attack Father 42  . Hypertension Father   . Hyperlipidemia Father   . Diabetes Father   . Heart attack Sister 54  . Colon cancer Neg Hx   . Stomach cancer Neg Hx   . Esophageal cancer Neg Hx   . Rectal cancer Neg Hx     Social History   Tobacco Use  . Smoking  status: Never Smoker  . Smokeless tobacco: Never Used  Substance Use Topics  . Alcohol use: No  . Drug use: No    Home Medications Prior to Admission medications   Medication Sig Start Date End Date Taking? Authorizing Provider  amitriptyline (ELAVIL) 10 MG tablet Take 1 tablet at bed time for one week and then increase 2 tablets at bed time 05/14/19   Cameron Sprang, MD  apixaban (ELIQUIS) 5 MG TABS tablet Take 5 mg by mouth 2 (two) times daily.    [provider]  carvedilol (COREG) 6.25 MG tablet Take 1 tablet (6.25 mg total) by mouth 2 (two) times daily with a meal. 09/27/18   Larey Dresser, MD  donepezil (ARICEPT) 10 MG tablet Take 1 tablet (10 mg total) by mouth at bedtime. 09/18/18   Cameron Sprang, MD  Evolocumab (REPATHA) 140 MG/ML SOSY Inject into the skin every 14 (fourteen) days.    [provider]  ezetimibe (ZETIA) 10 MG tablet Take 1 tablet (10 mg total) by mouth daily. 04/11/19   [name], MD  glucose blood (CONTOUR TEST) test strip 1 each by Other route 2 (two) times a day. 02/01/19   [name], MD  Insulin Degludec (TRESIBA FLEXTOUCH) 200 UNIT/ML SOPN Inject 220 Units into the skin daily. And pen needles 2/day 06/13/19   [name], MD  Insulin Syringe-Needle U-100 (INSULIN SYRINGE .3CC/29GX1/2") 29G X 1/2" 0.3 ML MISC Inject 1 Syringe 3 (three) times daily as directed. Check blood sugar three times daily. Dx: E11.9 05/10/17   [name], DO  lisinopril (PRINIVIL,ZESTRIL) 10 MG tablet Take 1 tablet (10 mg total) by mouth at bedtime. 09/27/18   Larey Dresser, MD  nitroGLYCERIN (NITROSTAT) 0.4 MG SL tablet Place 1 tablet (0.4 mg total) under the tongue every 5 (five) minutes as needed for chest pain. 10/27/17   [name], DO    Allergies    Patient has no known allergies.  Review of Systems   Review of Systems 10 Systems reviewed and are negative for acute change except as noted in the HPI.  Physical Exam Updated Vital  Signs BP (!) 146/93   Pulse (!) 102   Temp 98 F (36.7 C) (Oral)   Resp (!) 23   SpO2 100%   Physical Exam Constitutional:      Comments: Alert with clear mental status.  No respiratory distress.  Nontoxic.  HENT:     Head: Normocephalic and atraumatic.     Mouth/Throat:     Pharynx: Oropharynx is clear.  Eyes:  Extraocular Movements: Extraocular movements intact.     Pupils: Pupils are equal, round, and reactive to light.  Cardiovascular:     Rate and Rhythm: Normal rate and regular rhythm.     Comments: Patient endorses some discomfort to palpation over the right upper chest toward the shoulder.  No rashes or soft tissue abnormalities. Pulmonary:     Effort: Pulmonary effort is normal.     Breath sounds: Normal breath sounds.  Abdominal:     General: There is no distension.     Palpations: Abdomen is soft.     Tenderness: There is no abdominal tenderness. There is no guarding.  Musculoskeletal:        General: Normal range of motion.     Cervical back: Neck supple.     Comments: Lower extremities are symmetric.  No peripheral edema.  Calves are soft and pliable.  Skin:    General: Skin is warm and dry.  Neurological:     Comments: GCS 15.  Cognitive function intact.  Patient does not exhibit any confusion or somnolence.  Renal nerves II through XII intact.  Upper extremity grip strength 5\5 symmetric.  Patient has increased difficulty elevating his right lower extremity off of the bed relative to the left.  He is able to elevate the extremity on the right and hold briefly.  He also endorses sensation difference to light touch from left lower extremity to right.  Visual fields are intact.  Psychiatric:        Mood and Affect: Mood normal.     ED Results / Procedures / Treatments   Labs (all labs ordered are listed, but only abnormal results are displayed) Labs Reviewed  CBC - Abnormal; Notable for the following components:      Result Value   Platelets 142 (*)     All other components within normal limits  COMPREHENSIVE METABOLIC PANEL - Abnormal; Notable for the following components:   Sodium 133 (*)    Chloride 97 (*)    Glucose, Bld 254 (*)    BUN 21 (*)    Creatinine, Ser 1.65 (*)    Calcium 8.6 (*)    Albumin 3.4 (*)    GFR calc non Af Amer 45 (*)    GFR calc Af Amer 52 (*)    All other components within normal limits  SEDIMENTATION RATE - Abnormal; Notable for the following components:   Sed Rate 34 (*)    All other components within normal limits  C-REACTIVE PROTEIN - Abnormal; Notable for the following components:   CRP 16.5 (*)    All other components within normal limits  SARS CORONAVIRUS 2 (TAT 6-24 HRS)  ETHANOL  PROTIME-INR  APTT  DIFFERENTIAL  MAGNESIUM  PHOSPHORUS  TSH  RAPID URINE DRUG SCREEN, HOSP PERFORMED  URINALYSIS, ROUTINE W REFLEX MICROSCOPIC  TSH  I-STAT CHEM 8, ED  TROPONIN I (HIGH SENSITIVITY)    EKG EKG Interpretation  Date/Time:  Monday June 17 2019 11:16:56 EST Ventricular Rate:  98 PR Interval:    QRS Duration: 91 QT Interval:  343 QTC Calculation: 438 R Axis:   4 Text Interpretation: Sinus rhythm Probable left atrial enlargement Probable anteroseptal infarct, old Nonspecific T abnormalities, lateral leads no sig change from previous Confirmed by Charlesetta Shanks [phone]) on 06/17/2019 4:01:05 PM   Radiology CT HEAD WO CONTRAST  Result Date: 06/17/2019 CLINICAL DATA:  Right-sided weakness, 2 days duration. Recent falling. EXAM: CT HEAD WITHOUT CONTRAST TECHNIQUE: Contiguous axial images were obtained  from the base of the skull through the vertex without intravenous contrast. COMPARISON:  MRI 10/14/2017 FINDINGS: Brain: The brain shows a normal appearance without evidence of malformation, atrophy, old or acute small or large vessel infarction, mass lesion, hemorrhage, hydrocephalus or extra-axial collection. Vascular: There is atherosclerotic calcification of the major vessels at the base of the  brain. Skull: Normal.  No traumatic finding.  No focal bone lesion. Sinuses/Orbits: Sinuses are clear. Orbits appear normal. Mastoids are clear. Other: None significant IMPRESSION: Normal head CT except for atherosclerotic calcification of the major vessels at the base of the brain. Electronically Signed   By: Nelson Chimes M.D.   On: 06/17/2019 14:41   DG Chest Port 1 View  Result Date: 06/17/2019 CLINICAL DATA:  Chest pain EXAM: PORTABLE CHEST 1 VIEW COMPARISON:  June 05, 2019 FINDINGS: The heart size and mediastinal contours are within normal limits. Both lungs are clear. The visualized skeletal structures are unremarkable. IMPRESSION: No active disease. Electronically Signed   By: Prudencio Pair M.D.   On: 06/17/2019 12:12    Procedures Procedures (including critical care time)  Medications Ordered in ED Medications - No data to display  ED Course  I have reviewed the triage vital signs and the nursing notes.  Pertinent labs & imaging results that were available during my care of the patient were reviewed by me and considered in my medical decision making (see chart for details).  Clinical Course as of Jun 16 1622  Mon Jun 17, 2019  1613 Consult: Dr. Reesa Chew tired hospitalist accepts for admission.   [MP]    Clinical Course User Index [MP] Charlesetta Shanks, MD   MDM Rules/Calculators/A&P                     Patient with right lower extremity dysfunction.  He also describes some right upper extremity dysfunction.  Symptoms onset 2 days ago.  He is out of the window for acute stroke management.  CT head does not show any acute findings.  He describes these episodes as coming and going.  Consideration is for possible TIAs.  Patient does not endorse significant local back pain to suggest a neurologic spinal cord lesion.  However, this may be within the differential with waxing and waning weakness and lower extremity dysfunction.  He also has elevation in CRP and sed rate.  He does describe  some pain up around his right shoulder and upper back.  This he describes as being more recent.  However, no fever or cough.  Chest x-ray is clear.  No sign of pneumonia or effusion.  Will plan for admission for multiple concerning symptoms, specifically including gait dysfunction.  Final Clinical Impression(s) / ED Diagnoses Final diagnoses:  None    Rx / DC Orders ED Discharge Orders    None       Charlesetta Shanks, MD 06/17/19 1715

## 2019-06-18 ENCOUNTER — Other Ambulatory Visit: Payer: Self-pay | Admitting: Pharmacist

## 2019-06-18 ENCOUNTER — Encounter: Payer: Self-pay | Admitting: Family Medicine

## 2019-06-18 ENCOUNTER — Observation Stay (HOSPITAL_COMMUNITY): Payer: Medicare HMO

## 2019-06-18 ENCOUNTER — Other Ambulatory Visit: Payer: Self-pay

## 2019-06-18 DIAGNOSIS — Z8616 Personal history of COVID-19: Secondary | ICD-10-CM

## 2019-06-18 DIAGNOSIS — M5126 Other intervertebral disc displacement, lumbar region: Secondary | ICD-10-CM | POA: Diagnosis not present

## 2019-06-18 DIAGNOSIS — U071 COVID-19: Secondary | ICD-10-CM

## 2019-06-18 DIAGNOSIS — N179 Acute kidney failure, unspecified: Secondary | ICD-10-CM

## 2019-06-18 DIAGNOSIS — I10 Essential (primary) hypertension: Secondary | ICD-10-CM

## 2019-06-18 DIAGNOSIS — I5022 Chronic systolic (congestive) heart failure: Secondary | ICD-10-CM

## 2019-06-18 DIAGNOSIS — I480 Paroxysmal atrial fibrillation: Secondary | ICD-10-CM

## 2019-06-18 HISTORY — DX: COVID-19: U07.1

## 2019-06-18 HISTORY — DX: Personal history of COVID-19: Z86.16

## 2019-06-18 LAB — COMPREHENSIVE METABOLIC PANEL
ALT: 23 U/L (ref 0–44)
AST: 40 U/L (ref 15–41)
Albumin: 3 g/dL — ABNORMAL LOW (ref 3.5–5.0)
Alkaline Phosphatase: 61 U/L (ref 38–126)
Anion gap: 12 (ref 5–15)
BUN: 23 mg/dL — ABNORMAL HIGH (ref 6–20)
CO2: 22 mmol/L (ref 22–32)
Calcium: 8.5 mg/dL — ABNORMAL LOW (ref 8.9–10.3)
Chloride: 100 mmol/L (ref 98–111)
Creatinine, Ser: 1.39 mg/dL — ABNORMAL HIGH (ref 0.61–1.24)
GFR calc Af Amer: >60 mL/min (ref >60)
GFR calc non Af Amer: 55 mL/min — ABNORMAL LOW (ref >60)
Glucose, Bld: 176 mg/dL — ABNORMAL HIGH (ref 70–99)
Potassium: 5.3 mmol/L — ABNORMAL HIGH (ref 3.5–5.1)
Sodium: 134 mmol/L — ABNORMAL LOW (ref 135–145)
Total Bilirubin: 1.4 mg/dL — ABNORMAL HIGH (ref 0.3–1.2)
Total Protein: 6.8 g/dL (ref 6.5–8.1)

## 2019-06-18 LAB — CBC
HCT: 48.7 % (ref 39.0–52.0)
Hemoglobin: 16.4 g/dL (ref 13.0–17.0)
MCH: 30.3 pg (ref 26.0–34.0)
MCHC: 33.7 g/dL (ref 30.0–36.0)
MCV: 90 fL (ref 80.0–100.0)
Platelets: 134 10*3/uL — ABNORMAL LOW (ref 150–400)
RBC: 5.41 MIL/uL (ref 4.22–5.81)
RDW: 11.9 % (ref 11.5–15.5)
WBC: 6.1 10*3/uL (ref 4.0–10.5)
nRBC: 0 % (ref 0.0–0.2)

## 2019-06-18 LAB — C-REACTIVE PROTEIN: CRP: 18.5 mg/dL — ABNORMAL HIGH

## 2019-06-18 LAB — MAGNESIUM: Magnesium: 2.6 mg/dL — ABNORMAL HIGH (ref 1.7–2.4)

## 2019-06-18 LAB — FOLATE RBC
Folate, Hemolysate: 304 ng/mL
Folate, RBC: 596 ng/mL (ref >498)
Hematocrit: 51 % (ref 37.5–51.0)

## 2019-06-18 LAB — LACTATE DEHYDROGENASE: LDH: 465 U/L — ABNORMAL HIGH (ref 98–192)

## 2019-06-18 LAB — CBG MONITORING, ED
Glucose-Capillary: 148 mg/dL — ABNORMAL HIGH (ref 70–99)
Glucose-Capillary: 121 mg/dL — ABNORMAL HIGH (ref 70–99)
Glucose-Capillary: 125 mg/dL — ABNORMAL HIGH (ref 70–99)
Glucose-Capillary: 162 mg/dL — ABNORMAL HIGH (ref 70–99)

## 2019-06-18 LAB — D-DIMER, QUANTITATIVE: D-Dimer, Quant: 0.63 ug{FEU}/mL — ABNORMAL HIGH (ref 0.00–0.50)

## 2019-06-18 LAB — FERRITIN: Ferritin: 389 ng/mL — ABNORMAL HIGH (ref 24–336)

## 2019-06-18 MED ORDER — AMITRIPTYLINE HCL 10 MG PO TABS
20.0000 mg | ORAL_TABLET | Freq: Every day | ORAL | Status: DC
  Administered 2019-06-18 – 2019-06-30 (×13): 20 mg via ORAL
  Filled 2019-06-18 (×15): qty 2

## 2019-06-18 MED ORDER — CARVEDILOL 3.125 MG PO TABS
6.2500 mg | ORAL_TABLET | Freq: Two times a day (BID) | ORAL | Status: DC
  Administered 2019-06-18 – 2019-07-01 (×26): 6.25 mg via ORAL
  Filled 2019-06-18: qty 2
  Filled 2019-06-18: qty 1
  Filled 2019-06-18: qty 2
  Filled 2019-06-18: qty 1
  Filled 2019-06-18: qty 2
  Filled 2019-06-18 (×2): qty 1
  Filled 2019-06-18 (×2): qty 2
  Filled 2019-06-18 (×2): qty 1
  Filled 2019-06-18 (×2): qty 2
  Filled 2019-06-18: qty 1
  Filled 2019-06-18: qty 2
  Filled 2019-06-18 (×3): qty 1
  Filled 2019-06-18: qty 2
  Filled 2019-06-18: qty 1
  Filled 2019-06-18 (×3): qty 2
  Filled 2019-06-18 (×2): qty 1
  Filled 2019-06-18: qty 2

## 2019-06-18 MED ORDER — EZETIMIBE 10 MG PO TABS
10.0000 mg | ORAL_TABLET | Freq: Every day | ORAL | Status: DC
  Administered 2019-06-18 – 2019-07-01 (×14): 10 mg via ORAL
  Filled 2019-06-18 (×14): qty 1

## 2019-06-18 MED ORDER — DEXAMETHASONE 6 MG PO TABS
6.0000 mg | ORAL_TABLET | Freq: Every day | ORAL | Status: DC
  Administered 2019-06-18 – 2019-06-21 (×4): 6 mg via ORAL
  Filled 2019-06-18: qty 1
  Filled 2019-06-18: qty 2
  Filled 2019-06-18 (×2): qty 1

## 2019-06-18 NOTE — ED Notes (Signed)
Dr Teryl Lucy called and is aware of pt not being able to swallow pills - order received to crush meds - as pt is able to drink po fluids. Also aware of pt's VS's.

## 2019-06-18 NOTE — ED Notes (Signed)
Per PT, pt became orthostatic during eval - BP 129/71 supine - 89/61 sitting. RR 36. Paging Admitting MD.

## 2019-06-18 NOTE — Evaluation (Signed)
Physical Therapy Evaluation Patient Details Name: Calvin [name] MRN: [id number] DOB: April 30, 1961 Today's Date: 06/18/2019   History of Present Illness  58 y.o. male with medical history significant of combined systolic and diastolic CHF EF 58%, HTN, DM2-uncontrolled insulin-dependent, GERD, A. fib on Eliquis, obstructive sleep apnea, HLD came to the hospital with complaints of right-sided weakness of upper and lower extremity. CT negative.    Clinical Impression  Pt admitted with above diagnosis. PTA pt independent with mobility and ADLs, occasional use of RW vs SPC. On eval, pt required min guard assist bed mobility. Unable to progress beyond EOB due to orthostatic. BP 89/68 in sitting. Pt symptomatic with c/o dizziness/lightheaded requiring return to supine. BP 129/71 upon return to supine. Pt currently with functional limitations due to the deficits listed below (see PT Problem List). Pt will benefit from skilled PT to increase their independence and safety with mobility to allow discharge to the venue listed below.       Follow Up Recommendations Home health PT;Supervision for mobility/OOB    Equipment Recommendations  None recommended by PT    Recommendations for Other Services       Precautions / Restrictions Precautions Precautions: Fall;Other (comment) Precaution Comments: watch vitals      Mobility  Bed Mobility Overal bed mobility: Needs Assistance Bed Mobility: Supine to Sit;Sit to Supine     Supine to sit: Min guard;HOB elevated Sit to supine: Min guard;HOB elevated   General bed mobility comments: min guard for safety from stretcher in ED, increased time  Transfers                 General transfer comment: unable to progress beyond EOB due to pt orthostatic  Ambulation/Gait                Stairs            Wheelchair Mobility    Modified Rankin (Stroke Patients Only)       Balance                                              Pertinent Vitals/Pain Pain Assessment: No/denies pain    Home Living Family/patient expects to be discharged to:: Private residence Living Arrangements: Other relatives Available Help at Discharge: Family;Available 24 hours/day Type of Home: Apartment Home Access: Stairs to enter Entrance Stairs-Rails: Left Entrance Stairs-Number of Steps: 15 Home Layout: One level Home Equipment: Cane - single point;Walker - 2 wheels      Prior Function Level of Independence: Independent with assistive device(s)         Comments: occassional use of RW vs cane for amb     Hand Dominance        Extremity/Trunk Assessment   Upper Extremity Assessment Upper Extremity Assessment: Defer to OT evaluation    Lower Extremity Assessment Lower Extremity Assessment: RLE deficits/detail RLE Deficits / Details: 4/5, Pt reporting it feels 'heavy' and 'different from his left'    Cervical / Trunk Assessment Cervical / Trunk Assessment: Normal  Communication   Communication: No difficulties  Cognition Arousal/Alertness: Awake/alert Behavior During Therapy: WFL for tasks assessed/performed Overall Cognitive Status: Within Functional Limits for tasks assessed  General Comments General comments (skin integrity, edema, etc.): Pt with c/o feeling dizzy/lightheaded sitting EOB. BP taken in sitting, 89/68. Pt required return to supine. BP re-taken in supine, 129/71. Pt on RA with SpO2 >90%.    Exercises     Assessment/Plan    PT Assessment Patient needs continued PT services  PT Problem List Decreased strength;Decreased mobility;Decreased activity tolerance;Cardiopulmonary status limiting activity;Decreased balance       PT Treatment Interventions Therapeutic exercise;Gait training;Balance training;Stair training;Functional mobility training;Therapeutic activities;Patient/family education    PT Goals (Current goals can  be found in the Care Plan section)  Acute Rehab PT Goals Patient Stated Goal: not stated PT Goal Formulation: With patient Time For Goal Achievement: 07/02/19 Potential to Achieve Goals: Good    Frequency Min 3X/week   Barriers to discharge        Co-evaluation               AM-PAC PT "6 Clicks" Mobility  Outcome Measure Help needed turning from your back to your side while in a flat bed without using bedrails?: None Help needed moving from lying on your back to sitting on the side of a flat bed without using bedrails?: A Little Help needed moving to and from a bed to a chair (including a wheelchair)?: A Little Help needed standing up from a chair using your arms (e.g., wheelchair or bedside chair)?: A Little Help needed to walk in hospital room?: A Lot Help needed climbing 3-5 steps with a railing? : A Lot 6 Click Score: 17    End of Session   Activity Tolerance: Treatment limited secondary to medical complications (Comment)(orthostatic) Patient left: in bed;with call bell/phone within reach Nurse Communication: Mobility status PT Visit Diagnosis: Other abnormalities of gait and mobility (R26.89);History of falling (Z91.81)    Time: 4034-7425 PT Time Calculation (min) (ACUTE ONLY): 24 min   Charges:   PT Evaluation $PT Eval Moderate Complexity: 1 Mod PT Treatments $Therapeutic Activity: 8-22 mins        Lorrin Goodell, PT  Office # [phone] Pager [phone]   Lorriane Shire 06/18/2019, 11:33 AM

## 2019-06-18 NOTE — ED Notes (Signed)
Assisted pt to more comfortable position. Pt c/o toes tingling - advised not new - advised pt pending MRI Lumbar Spine. Voiced understanding and agreement w/plan.

## 2019-06-18 NOTE — ED Notes (Signed)
Admitting MD In w/pt.

## 2019-06-18 NOTE — ED Notes (Signed)
Message sent to Admitting MD asking for med to help pt relax and rest as pt requested.

## 2019-06-18 NOTE — ED Notes (Signed)
Pt eating peaches from lunch tray.

## 2019-06-18 NOTE — ED Notes (Signed)
Paging Admitting MD x 2.

## 2019-06-18 NOTE — ED Notes (Addendum)
Pt only ate a couple small bites of breakfast and drank 170m Sprite Zero - states he is unable to swallow meds. Denies n/v. States "It gets to the back of my throat and won't go down". Pt able to drink water w/o difficulty.

## 2019-06-18 NOTE — Progress Notes (Signed)
PROGRESS NOTE    Calvin [name]  OKH:[id number] DOB: 1961-05-18 DOA: 06/17/2019 PCP: [name], NP   Brief Narrative: Calvin [name] is a 58 y.o. 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 fib on Eliquis, obstructive sleep apnea, HLD. Patient presented secondary to right sided weakness and found to be positive for SARS-CoV-2 infection. MRI brain negative for stroke. No pneumonia on chest x-ray.   Assessment & Plan:   Principal Problem:   Right sided weakness Active Problems:   DM (diabetes mellitus) (HCC)   HLD (hyperlipidemia)   Chronic systolic heart failure (HCC)   Essential hypertension   OSA on CPAP   Onychomycosis of multiple toenails with type 2 diabetes mellitus and peripheral neuropathy (HCC)   MCI (mild cognitive impairment)   Diabetic neuropathy (HCC)   AF (paroxysmal atrial fibrillation) (HCC)   AKI (acute kidney injury) ([place])   COVID-19 virus detected   Right sided weakness Unknown etiology. Still with persistent symptoms. No other associated symptoms. No back pain. No numbness. -MRI lumbar spine -PT eval  COVID-19 infection Incidental finding. Patient with elevated CRP on admission of 16.5 which is up to 18.5 today. No hypoxia or infiltrate on chest x-ray -Decadron 6 mg daily -Watch symptoms daily -Daily CMP, CBC, Ferritin, D-dimer, CRP  AKI on CKD stage IIIa Baseline creatinine of about 1.2. Peak of 1.65 with improvement down to 1.39 with IV fluids  Diabetes mellitus, type 2 Hemoglobin A1C of 11.9% -Continue Lantus and SSI -Watch in setting of Decadron  Peripheral neuropathy -Continue amitriptyline  Chronic systolic heart failure Last Transthoracic Echocardiogram from July 2020 significant for an EF of 35-40%. -Watch daily weights -Strict in/out  Paroxysmal atrial fibrillation -Continue Coreg and Eliquis  Hyperlipidemia -Continue  Zetia  Essential hypertension -Continue Coreg and hold lisinopril (secondary to AKI)   DVT prophylaxis: Eliquis Code Status:   Code Status: Full Code Family Communication: None Disposition Plan: Discharge pending continued management of COVID-19 symptoms, improvement of AKI, workup for RLE weakness   Consultants:   None  Procedures:   None  Antimicrobials:  None    Subjective: No dyspnea or chest pain. Persistent right sided weakness  Objective: Vitals:   06/18/19 0700 06/18/19 0730 06/18/19 0800 06/18/19 0804  BP: 128/70  131/76   Pulse:   91   Resp: 20  19   Temp:   98.5 F (36.9 C) 98.5 F (36.9 C)  TempSrc:   Oral Oral  SpO2:  97% 97%    No intake or output data in the 24 hours ending 06/18/19 0933 There were no vitals filed for this visit.  Examination:  General exam: Appears calm and comfortable Respiratory system: Clear to auscultation. Respiratory effort normal. Cardiovascular system: S1 & S2 heard, RRR. No murmurs, rubs, gallops or clicks. Gastrointestinal system: Abdomen is nondistended, soft and nontender. No organomegaly or masses felt. Normal bowel sounds heard. Central nervous system: Alert and oriented. Right LE weakness (3-4/5) compared to left. Extremities: No edema. No calf tenderness Skin: No cyanosis. No rashes Psychiatry: Judgement and insight appear normal. Mood & affect appropriate.     Data Reviewed: I have personally reviewed following labs and imaging studies  CBC: Recent Labs  Lab 06/17/19 1240 06/17/19 1650 06/18/19 0510  WBC 5.4  --  6.1  NEUTROABS 3.9  --   --   HGB 16.3 17.7* 16.4  HCT 47.6 52.0 48.7  MCV 89.1  --  90.0  PLT  142*  --  528*   Basic Metabolic Panel: Recent Labs  Lab 06/17/19 1240 06/17/19 1650 06/18/19 0510  NA 133* 135 134*  K 4.0 4.2 5.3*  CL 97* 99 100  CO2 22  --  22  GLUCOSE 254* 222* 176*  BUN 21* 26* 23*  CREATININE 1.65* 1.50* 1.39*  CALCIUM 8.6*  --  8.5*  MG 2.4  --  2.6*    PHOS 3.1  --   --    GFR: CrCl cannot be calculated (Unknown ideal weight.). Liver Function Tests: Recent Labs  Lab 06/17/19 1240 06/18/19 0510  AST 23 40  ALT 16 23  ALKPHOS 66 61  BILITOT 0.6 1.4*  PROT 7.2 6.8  ALBUMIN 3.4* 3.0*   No results for input(s): LIPASE, AMYLASE in the last 168 hours. No results for input(s): AMMONIA in the last 168 hours. Coagulation Profile: Recent Labs  Lab 06/17/19 1240  INR 1.0   Cardiac Enzymes: Recent Labs  Lab 06/17/19 1643  CKTOTAL 224   BNP (last 3 results) No results for input(s): PROBNP in the last 8760 hours. HbA1C: Recent Labs    06/17/19 1643  HGBA1C 11.9*   CBG: Recent Labs  Lab 06/17/19 1659 06/17/19 1944 06/17/19 2218 06/18/19 0756  GLUCAP 184* 188* 213* 148*   Lipid Profile: No results for input(s): CHOL, HDL, LDLCALC, TRIG, CHOLHDL, LDLDIRECT in the last 72 hours. Thyroid Function Tests: Recent Labs    06/17/19 1643  TSH 2.428   Anemia Panel: Recent Labs    06/17/19 1643 06/18/19 0510  VITAMINB12 315  --   FERRITIN  --  389*   Sepsis Labs: Recent Labs  Lab 06/17/19 1643  PROCALCITON 0.35    Recent Results (from the past 240 hour(s))  SARS CORONAVIRUS 2 (TAT 6-24 HRS) Nasopharyngeal Nasopharyngeal Swab     Status: Abnormal   Collection Time: 06/17/19  4:20 PM   Specimen: Nasopharyngeal Swab  Result Value Ref Range Status   SARS Coronavirus 2 POSITIVE (A) NEGATIVE Final    Comment: RESULT CALLED TO, READ BACK BY AND VERIFIED WITH: RN MONIQUE DAVIS AT [facility] ON 06/17/2019 (NOTE) SARS-CoV-2 target nucleic acids are DETECTED. The SARS-CoV-2 RNA is generally detectable in upper and lower respiratory specimens during the acute phase of infection. Positive results are indicative of the presence of SARS-CoV-2 RNA. Clinical correlation with patient history and other diagnostic information is  necessary to determine patient infection status. Positive results do not rule out  bacterial infection or co-infection with other viruses.  The expected result is Negative. Fact Sheet for Patients: SugarRoll.be Fact Sheet for Healthcare Providers: https://www.woods-mathews.com/ This test is not yet approved or cleared by the [place] FDA and  has been authorized for detection and/or diagnosis of SARS-CoV-2 by FDA under an Emergency Use Authorization (EUA). This EUA will remain  in effect (meaning this t est can be used) for the duration of the COVID-19 declaration under Section 564(b)(1) of the Act, 21 U.S.C. section 360bbb-3(b)(1), unless the authorization is terminated or revoked sooner. Performed at [facility], [place] [address], [place], [place] [address]          Radiology Studies: CT HEAD WO CONTRAST  Result Date: 06/17/2019 CLINICAL DATA:  Right-sided weakness, 2 days duration. Recent falling. EXAM: CT HEAD WITHOUT CONTRAST TECHNIQUE: Contiguous axial images were obtained from the base of the skull through the vertex without intravenous contrast. COMPARISON:  MRI 10/14/2017 FINDINGS: Brain: The brain shows a normal appearance  without evidence of malformation, atrophy, old or acute small or large vessel infarction, mass lesion, hemorrhage, hydrocephalus or extra-axial collection. Vascular: There is atherosclerotic calcification of the major vessels at the base of the brain. Skull: Normal.  No traumatic finding.  No focal bone lesion. Sinuses/Orbits: Sinuses are clear. Orbits appear normal. Mastoids are clear. Other: None significant IMPRESSION: Normal head CT except for atherosclerotic calcification of the major vessels at the base of the brain. Electronically Signed   By: Nelson Chimes M.D.   On: 06/17/2019 14:41   MR BRAIN W WO CONTRAST  Result Date: 06/17/2019 CLINICAL DATA:  Neuro deficit. Subacute right-sided weakness. EXAM: MRI HEAD WITHOUT AND WITH CONTRAST TECHNIQUE: Multiplanar, multiecho pulse sequences  of the brain and surrounding structures were obtained without and with intravenous contrast. CONTRAST:  51m GADAVIST GADOBUTROL 1 MMOL/ML IV SOLN COMPARISON:  CT head 06/17/2019. MRI 10/14/2017 FINDINGS: Brain: No acute infarction, hemorrhage, hydrocephalus, extra-axial collection or mass lesion. No significant chronic ischemia Normal enhancement postcontrast administration Vascular: Normal arterial flow voids Skull and upper cervical spine: No focal skeletal lesion. Sinuses/Orbits: Mild mucosal edema paranasal sinuses. Normal orbit Other: None IMPRESSION: Negative MRI head with contrast. Electronically Signed   By: CFranchot GalloM.D.   On: 06/17/2019 19:01   DG Chest Port 1 View  Result Date: 06/17/2019 CLINICAL DATA:  Chest pain EXAM: PORTABLE CHEST 1 VIEW COMPARISON:  June 05, 2019 FINDINGS: The heart size and mediastinal contours are within normal limits. Both lungs are clear. The visualized skeletal structures are unremarkable. IMPRESSION: No active disease. Electronically Signed   By: BPrudencio PairM.D.   On: 06/17/2019 12:12        Scheduled Meds: . apixaban  5 mg Oral BID  . dexamethasone  6 mg Oral Daily  . docusate sodium  100 mg Oral BID  . insulin aspart  0-15 Units Subcutaneous TID WC  . insulin aspart  0-5 Units Subcutaneous QHS  . insulin aspart  3 Units Subcutaneous TID WC  . insulin glargine  20 Units Subcutaneous QHS   Continuous Infusions: . sodium chloride 75 mL/hr at 06/17/19 1903     LOS: 0 days     RCordelia Poche MD [facility] 06/18/2019, 9:33 AM  If 7PM-7AM, please contact night-coverage [email or web address]

## 2019-06-18 NOTE — Patient Outreach (Signed)
[name] D [facility]) Care Management  [place] 06/18/2019  Calvin [name] [birth year] [id number]  Call scheduled with patient to follow-up on diabetes management.  Noted patient currently in ED with right sided weakness, plans for patient to be admitted for observation.  CT head negative.  Will reschedule call to f/u with patient once discharged  Ralene Bathe, PharmD, [place] [phone]

## 2019-06-18 NOTE — Progress Notes (Signed)
Inpatient Diabetes Program Recommendations  AACE/ADA: New Consensus Statement on Inpatient Glycemic Control (2015)  Target Ranges:  Prepandial:   less than 140 mg/dL      Peak postprandial:   less than 180 mg/dL (1-2 hours)      Critically ill patients:  140 - 180 mg/dL   Lab Results  Component Value Date   GLUCAP 121 (H) 06/18/2019   HGBA1C 11.9 (H) 06/17/2019      Diabetes history: DM2 Outpatient Diabetes medications: Tresiba 20 units daily Current orders for Inpatient glycemic control: Lantus 20 units HS; Novolog 0-15 units TID and 0-5 HS; meal coverage Novolog 3 units TID with meals  Note:  Spoke with patient on the phone.  Reviewed patient's current A1c of 11.9. Explained what a A1c is and what it measures. Also reviewed goal A1c with patient, importance of good glucose control @ home, and blood sugar goals.  He states his blood sugars have been running 300-400 at home.  He drinks 2 [place] per day and does not limit carbohydrates.  Encouraged patient to change to diet drinks and water.  Discussed risks of elevated blood sugars.  He is current with Dr. Loanne Drilling and last saw him last month.  Denies difficulties obtaining medications and states he is taking his medicine as prescribed.  CBG's have been in mostly in range during hospitalization.    Thank you, Geoffry Paradise, RN, BSN Diabetes Coordinator Inpatient Diabetes Program [phone] (team pager from 8a-5p)

## 2019-06-19 ENCOUNTER — Inpatient Hospital Stay (HOSPITAL_COMMUNITY): Payer: Medicare HMO

## 2019-06-19 DIAGNOSIS — I428 Other cardiomyopathies: Secondary | ICD-10-CM | POA: Diagnosis present

## 2019-06-19 DIAGNOSIS — I252 Old myocardial infarction: Secondary | ICD-10-CM | POA: Diagnosis not present

## 2019-06-19 DIAGNOSIS — K219 Gastro-esophageal reflux disease without esophagitis: Secondary | ICD-10-CM | POA: Diagnosis present

## 2019-06-19 DIAGNOSIS — U071 COVID-19: Secondary | ICD-10-CM | POA: Diagnosis present

## 2019-06-19 DIAGNOSIS — I5043 Acute on chronic combined systolic (congestive) and diastolic (congestive) heart failure: Secondary | ICD-10-CM | POA: Diagnosis present

## 2019-06-19 DIAGNOSIS — J1289 Other viral pneumonia: Secondary | ICD-10-CM | POA: Diagnosis present

## 2019-06-19 DIAGNOSIS — R42 Dizziness and giddiness: Secondary | ICD-10-CM | POA: Diagnosis present

## 2019-06-19 DIAGNOSIS — K5900 Constipation, unspecified: Secondary | ICD-10-CM | POA: Diagnosis not present

## 2019-06-19 DIAGNOSIS — N1831 Chronic kidney disease, stage 3a: Secondary | ICD-10-CM | POA: Diagnosis present

## 2019-06-19 DIAGNOSIS — Z8249 Family history of ischemic heart disease and other diseases of the circulatory system: Secondary | ICD-10-CM | POA: Diagnosis not present

## 2019-06-19 DIAGNOSIS — J9600 Acute respiratory failure, unspecified whether with hypoxia or hypercapnia: Secondary | ICD-10-CM | POA: Diagnosis not present

## 2019-06-19 DIAGNOSIS — G3184 Mild cognitive impairment, so stated: Secondary | ICD-10-CM | POA: Diagnosis present

## 2019-06-19 DIAGNOSIS — E1142 Type 2 diabetes mellitus with diabetic polyneuropathy: Secondary | ICD-10-CM | POA: Diagnosis present

## 2019-06-19 DIAGNOSIS — I2510 Atherosclerotic heart disease of native coronary artery without angina pectoris: Secondary | ICD-10-CM | POA: Diagnosis present

## 2019-06-19 DIAGNOSIS — B351 Tinea unguium: Secondary | ICD-10-CM | POA: Diagnosis present

## 2019-06-19 DIAGNOSIS — E1165 Type 2 diabetes mellitus with hyperglycemia: Secondary | ICD-10-CM | POA: Diagnosis present

## 2019-06-19 DIAGNOSIS — R531 Weakness: Secondary | ICD-10-CM | POA: Diagnosis present

## 2019-06-19 DIAGNOSIS — T380X5A Adverse effect of glucocorticoids and synthetic analogues, initial encounter: Secondary | ICD-10-CM | POA: Diagnosis not present

## 2019-06-19 DIAGNOSIS — I480 Paroxysmal atrial fibrillation: Secondary | ICD-10-CM | POA: Diagnosis present

## 2019-06-19 DIAGNOSIS — N179 Acute kidney failure, unspecified: Secondary | ICD-10-CM | POA: Diagnosis present

## 2019-06-19 DIAGNOSIS — G4733 Obstructive sleep apnea (adult) (pediatric): Secondary | ICD-10-CM | POA: Diagnosis present

## 2019-06-19 DIAGNOSIS — I130 Hypertensive heart and chronic kidney disease with heart failure and stage 1 through stage 4 chronic kidney disease, or unspecified chronic kidney disease: Secondary | ICD-10-CM | POA: Diagnosis present

## 2019-06-19 DIAGNOSIS — Z7901 Long term (current) use of anticoagulants: Secondary | ICD-10-CM | POA: Diagnosis not present

## 2019-06-19 DIAGNOSIS — J9601 Acute respiratory failure with hypoxia: Secondary | ICD-10-CM | POA: Diagnosis not present

## 2019-06-19 DIAGNOSIS — E785 Hyperlipidemia, unspecified: Secondary | ICD-10-CM | POA: Diagnosis present

## 2019-06-19 DIAGNOSIS — E1122 Type 2 diabetes mellitus with diabetic chronic kidney disease: Secondary | ICD-10-CM | POA: Diagnosis present

## 2019-06-19 LAB — CBC
HCT: 43.5 % (ref 39.0–52.0)
Hemoglobin: 14.9 g/dL (ref 13.0–17.0)
MCH: 30.3 pg (ref 26.0–34.0)
MCHC: 34.3 g/dL (ref 30.0–36.0)
MCV: 88.4 fL (ref 80.0–100.0)
Platelets: 133 10*3/uL — ABNORMAL LOW (ref 150–400)
RBC: 4.92 MIL/uL (ref 4.22–5.81)
RDW: 11.7 % (ref 11.5–15.5)
WBC: 5.2 10*3/uL (ref 4.0–10.5)
nRBC: 0 % (ref 0.0–0.2)

## 2019-06-19 LAB — COMPREHENSIVE METABOLIC PANEL
ALT: 17 U/L (ref 0–44)
AST: 29 U/L (ref 15–41)
Albumin: 2.6 g/dL — ABNORMAL LOW (ref 3.5–5.0)
Alkaline Phosphatase: 49 U/L (ref 38–126)
Anion gap: 13 (ref 5–15)
BUN: 23 mg/dL — ABNORMAL HIGH (ref 6–20)
CO2: 20 mmol/L — ABNORMAL LOW (ref 22–32)
Calcium: 8.2 mg/dL — ABNORMAL LOW (ref 8.9–10.3)
Chloride: 101 mmol/L (ref 98–111)
Creatinine, Ser: 1.3 mg/dL — ABNORMAL HIGH (ref 0.61–1.24)
GFR calc Af Amer: >60 mL/min (ref >60)
GFR calc non Af Amer: 60 mL/min — ABNORMAL LOW (ref >60)
Glucose, Bld: 213 mg/dL — ABNORMAL HIGH (ref 70–99)
Potassium: 4.4 mmol/L (ref 3.5–5.1)
Sodium: 134 mmol/L — ABNORMAL LOW (ref 135–145)
Total Bilirubin: 0.5 mg/dL (ref 0.3–1.2)
Total Protein: 6.7 g/dL (ref 6.5–8.1)

## 2019-06-19 LAB — CK: Total CK: 138 U/L (ref 49–397)

## 2019-06-19 LAB — GLUCOSE, CAPILLARY
Glucose-Capillary: 195 mg/dL — ABNORMAL HIGH (ref 70–99)
Glucose-Capillary: 174 mg/dL — ABNORMAL HIGH (ref 70–99)
Glucose-Capillary: 80 mg/dL (ref 70–99)
Glucose-Capillary: 158 mg/dL — ABNORMAL HIGH (ref 70–99)
Glucose-Capillary: 274 mg/dL — ABNORMAL HIGH (ref 70–99)

## 2019-06-19 LAB — C-REACTIVE PROTEIN: CRP: 13.6 mg/dL — ABNORMAL HIGH (ref <1.0)

## 2019-06-19 LAB — MAGNESIUM: Magnesium: 2.5 mg/dL — ABNORMAL HIGH (ref 1.7–2.4)

## 2019-06-19 LAB — FERRITIN: Ferritin: 368 ng/mL — ABNORMAL HIGH (ref 24–336)

## 2019-06-19 MED ORDER — DM-GUAIFENESIN ER 30-600 MG PO TB12
1.0000 | ORAL_TABLET | Freq: Two times a day (BID) | ORAL | Status: DC | PRN
  Administered 2019-06-19 – 2019-06-23 (×4): 1 via ORAL
  Filled 2019-06-19 (×4): qty 1

## 2019-06-19 MED ORDER — SODIUM CHLORIDE 0.9 % IV SOLN
200.0000 mg | Freq: Once | INTRAVENOUS | Status: AC
  Administered 2019-06-19: 200 mg via INTRAVENOUS
  Filled 2019-06-19: qty 20

## 2019-06-19 MED ORDER — SODIUM CHLORIDE 0.9 % IV SOLN
100.0000 mg | Freq: Every day | INTRAVENOUS | Status: DC
  Administered 2019-06-20 – 2019-06-22 (×3): 100 mg via INTRAVENOUS
  Filled 2019-06-19 (×3): qty 20

## 2019-06-19 NOTE — ED Notes (Signed)
ED TO INPATIENT HANDOFF REPORT  ED Nurse Name and Phone #: [phone] Callie Fielding   S Name/Age/Gender Calvin [name] 58 y.o. male Room/Bed: 053C/053C  Code Status   Code Status: Full Code  Home/SNF/Other Home Patient oriented to: self, place, time and situation Is this baseline? Yes   Triage Complete: Triage complete  Chief Complaint Right sided weakness [R53.1]  Triage Note Patient here by [facility] with right-sided weakness X 2days and feeling "tired" Patient reported right chest in route per EMS  Patient says he has been falling for 2 days when he tries to walk sometimes walker/cane at baseline Neuro intact Equal grips    Allergies No Known Allergies  Level of Care/Admitting Diagnosis ED Disposition    ED Disposition Condition [place]: [place] [[id number]]  Level of Care: Telemetry Medical [104]  I expect the patient will be discharged within 24 hours: Yes  LOW acuity---Tx typically complete <24 hrs---ACUTE conditions typically can be evaluated <24 hours---LABS likely to return to acceptable levels <24 hours---IS near functional baseline---EXPECTED to return to current living arrangement---NOT newly hypoxic: Does not meet criteria for 5C-Observation unit  Covid Evaluation: Confirmed COVID Positive  Diagnosis: Right sided weakness [[id number]]  Admitting Physician: Gerlean Ren [facility] [[id number]]  Attending Physician: Gerlean Ren [facility] [[id number]]       B Medical/Surgery History Past Medical History:  Diagnosis Date  . Adenoidal hypertrophy   . Adenomatous colon polyps 2012  . Arthritis   . Diabetes mellitus   . GERD (gastroesophageal reflux disease)   . [place]    [place]  [name] Hx of adenomatous colonic polyps 08/18/2017  . Hyperlipidemia   . Hypertension   . Mild CAD    a. cath in 08/2017 showing mild nonobstructive CAD with scattered 20-30% stenosis.   . Nonischemic cardiomyopathy ([place])    a.  EF 20-25% by echo in 09/2017 with cath showing mild CAD. b.  Last echo 12/2017 EF 35-40%, grade 2 DD.  [name] Obesity   . PAF (paroxysmal atrial fibrillation) ([place])   . Sleep apnea    cpap, pt says no longer has   Past Surgical History:  Procedure Laterality Date  . COLONOSCOPY  05/13/11   9 adenomas  . FOREARM SURGERY    . MUSCLE BIOPSY    . RIGHT/LEFT HEART CATH AND CORONARY ANGIOGRAPHY N/A 08/25/2017   Procedure: RIGHT/LEFT HEART CATH AND CORONARY ANGIOGRAPHY;  Surgeon: [name], MD;  Location: [place] [facility];  Service: Cardiovascular;  Laterality: N/A;     A IV Location/Drains/Wounds Patient Lines/Drains/Airways Status   Active Line/Drains/Airways    Name:   Placement date:   Placement time:   Site:   Days:   Peripheral IV 06/17/19 Left Antecubital   06/17/19    1804    Antecubital   2   Peripheral IV 06/18/19 Right Forearm   06/18/19    0516    Forearm   1          Intake/Output Last 24 hours  Intake/Output Summary (Last 24 hours) at 06/19/2019 0006 Last data filed at 06/18/2019 1819 Gross per 24 hour  Intake 740 ml  Output 700 ml  Net 40 ml    Labs/Imaging Results for orders placed or performed during the hospital encounter of 06/17/19 (from the past 48 hour(s))  Ethanol     Status: None   Collection Time: 06/17/19 12:40 PM  Result Value Ref Range  Alcohol, Ethyl (B) <10 <10 mg/dL    Comment: (NOTE) Lowest detectable limit for serum alcohol is 10 mg/dL. For medical purposes only. Performed at [facility], [place] [address], [place], [place] [address]   Protime-INR     Status: None   Collection Time: 06/17/19 12:40 PM  Result Value Ref Range   Prothrombin Time 12.8 11.4 - 15.2 seconds   INR 1.0 0.8 - 1.2    Comment: (NOTE) INR goal varies based on device and disease states. Performed at [facility], [place] [address] [place], [place] [address]   APTT     Status: None   Collection Time: 06/17/19 12:40 PM  Result Value Ref Range    aPTT 32 24 - 36 seconds    Comment: Performed at [facility] [address], [place], [place] [address]  CBC     Status: Abnormal   Collection Time: 06/17/19 12:40 PM  Result Value Ref Range   WBC 5.4 4.0 - 10.5 K/uL   RBC 5.34 4.22 - 5.81 MIL/uL   Hemoglobin 16.3 13.0 - 17.0 g/dL   HCT 47.6 39.0 - 52.0 %   MCV 89.1 80.0 - 100.0 fL   MCH 30.5 26.0 - 34.0 pg   MCHC 34.2 30.0 - 36.0 g/dL   RDW 11.6 11.5 - 15.5 %   Platelets 142 (L) 150 - 400 K/uL   nRBC 0.0 0.0 - 0.2 %    Comment: Performed at [facility], [place] [address], [place], [place] [address]  Differential     Status: None   Collection Time: 06/17/19 12:40 PM  Result Value Ref Range   Neutrophils Relative % 73 %   Neutro Abs 3.9 1.7 - 7.7 K/uL   Lymphocytes Relative 20 %   Lymphs Abs 1.1 0.7 - 4.0 K/uL   Monocytes Relative 7 %   Monocytes Absolute 0.4 0.1 - 1.0 K/uL   Eosinophils Relative 0 %   Eosinophils Absolute 0.0 0.0 - 0.5 K/uL   Basophils Relative 0 %   Basophils Absolute 0.0 0.0 - 0.1 K/uL   Immature Granulocytes 0 %   Abs Immature Granulocytes 0.02 0.00 - 0.07 K/uL    Comment: Performed at [facility] [address] [place], [place] [address]  Comprehensive metabolic panel     Status: Abnormal   Collection Time: 06/17/19 12:40 PM  Result Value Ref Range   Sodium 133 (L) 135 - 145 mmol/L   Potassium 4.0 3.5 - 5.1 mmol/L   Chloride 97 (L) 98 - 111 mmol/L   CO2 22 22 - 32 mmol/L   Glucose, Bld 254 (H) 70 - 99 mg/dL   BUN 21 (H) 6 - 20 mg/dL   Creatinine, Ser 1.65 (H) 0.61 - 1.24 mg/dL   Calcium 8.6 (L) 8.9 - 10.3 mg/dL   Total Protein 7.2 6.5 - 8.1 g/dL   Albumin 3.4 (L) 3.5 - 5.0 g/dL   AST 23 15 - 41 U/L   ALT 16 0 - 44 U/L   Alkaline Phosphatase 66 38 - 126 U/L   Total Bilirubin 0.6 0.3 - 1.2 mg/dL   GFR calc non Af Amer 45 (L) >60 mL/min   GFR calc Af Amer 52 (L) >60 mL/min   Anion gap 14 5 - 15    Comment: Performed at [facility], [place] [address] [place], [place]  [address]  Magnesium     Status: None   Collection Time:  06/17/19 12:40 PM  Result Value Ref Range   Magnesium 2.4 1.7 - 2.4 mg/dL    Comment: Performed at [facility], [place] [address] [place], [place] [address]  Phosphorus     Status: None   Collection Time: 06/17/19 12:40 PM  Result Value Ref Range   Phosphorus 3.1 2.5 - 4.6 mg/dL    Comment: Performed at [facility] [address] [place], [place] [address]  Sedimentation rate     Status: Abnormal   Collection Time: 06/17/19 12:40 PM  Result Value Ref Range   Sed Rate 34 (H) 0 - 16 mm/hr    Comment: Performed at [facility] [address], [place], [place] [address]  C-reactive protein     Status: Abnormal   Collection Time: 06/17/19  1:00 PM  Result Value Ref Range   CRP 16.5 (H) <1.0 mg/dL    Comment: Performed at [facility], [place] [address], [place], [place] [address]  TSH     Status: None   Collection Time: 06/17/19  1:00 PM  Result Value Ref Range   TSH 1.854 0.350 - 4.500 uIU/mL    Comment: Performed by a 3rd Generation assay with a functional sensitivity of <=0.01 uIU/mL. Performed at [facility], [place] [address], [place], [place] [address]   Urinalysis, Routine w reflex microscopic     Status: Abnormal   Collection Time: 06/17/19  4:00 PM  Result Value Ref Range   Color, Urine YELLOW YELLOW   APPearance HAZY (A) CLEAR   Specific Gravity, Urine 1.023 1.005 - 1.030   pH 5.0 5.0 - 8.0   Glucose, UA >=500 (A) NEGATIVE mg/dL   Hgb urine dipstick MODERATE (A) NEGATIVE   Bilirubin Urine NEGATIVE NEGATIVE   Ketones, ur NEGATIVE NEGATIVE mg/dL   Protein, ur 100 (A) NEGATIVE mg/dL   Nitrite NEGATIVE NEGATIVE   Leukocytes,Ua NEGATIVE NEGATIVE   RBC / HPF 0-5 0 - 5 RBC/hpf   WBC, UA 0-5 0 - 5 WBC/hpf   Bacteria, UA RARE (A) NONE SEEN   Squamous Epithelial / LPF 0-5 0 - 5   Mucus PRESENT    Hyaline Casts, UA PRESENT     Comment: Performed at [facility], [place] [address],  [place], [place] [address]  Troponin I (High Sensitivity)     Status: Abnormal   Collection Time: 06/17/19  4:00 PM  Result Value Ref Range   Troponin I (High Sensitivity) 29 (H) <18 ng/L    Comment: (NOTE) Elevated high sensitivity troponin I (hsTnI) values and significant  changes across serial measurements may suggest ACS but many other  chronic and acute conditions are known to elevate hsTnI results.  Refer to the "Links" section for chest pain algorithms and additional  guidance. Performed at [facility], [place] [address], [place],  [address]   Urine rapid drug screen (hosp performed)     Status: None   Collection Time: 06/17/19  4:01 PM  Result Value Ref Range   Opiates NONE DETECTED NONE DETECTED   Cocaine NONE DETECTED NONE DETECTED   Benzodiazepines NONE DETECTED NONE DETECTED   Amphetamines NONE DETECTED NONE DETECTED   Tetrahydrocannabinol NONE DETECTED NONE DETECTED   Barbiturates NONE DETECTED NONE DETECTED    Comment: (NOTE) DRUG SCREEN FOR MEDICAL PURPOSES ONLY.  IF CONFIRMATION IS NEEDED FOR ANY PURPOSE, NOTIFY LAB WITHIN 5 DAYS. LOWEST DETECTABLE LIMITS FOR URINE DRUG SCREEN Drug Class  Cutoff (ng/mL) Amphetamine and metabolites    1000 Barbiturate and metabolites    200 Benzodiazepine                 256 Tricyclics and metabolites     300 Opiates and metabolites        300 Cocaine and metabolites        300 THC                            50 Performed at [facility], [place] [address], [place], [place] [address]   SARS CORONAVIRUS 2 (TAT 6-24 HRS) Nasopharyngeal Nasopharyngeal Swab     Status: Abnormal   Collection Time: 06/17/19  4:20 PM   Specimen: Nasopharyngeal Swab  Result Value Ref Range   SARS Coronavirus 2 POSITIVE (A) NEGATIVE    Comment: RESULT CALLED TO, READ BACK BY AND VERIFIED WITH: RN MONIQUE DAVIS AT [facility] ON 06/17/2019 (NOTE) SARS-CoV-2 target nucleic acids are DETECTED. The SARS-CoV-2  RNA is generally detectable in upper and lower respiratory specimens during the acute phase of infection. Positive results are indicative of the presence of SARS-CoV-2 RNA. Clinical correlation with patient history and other diagnostic information is  necessary to determine patient infection status. Positive results do not rule out bacterial infection or co-infection with other viruses.  The expected result is Negative. Fact Sheet for Patients: SugarRoll.be Fact Sheet for Healthcare Providers: https://www.woods-mathews.com/ This test is not yet approved or cleared by the [place] FDA and  has been authorized for detection and/or diagnosis of SARS-CoV-2 by FDA under an Emergency Use Authorization (EUA). This EUA will remain  in effect (meaning this t est can be used) for the duration of the COVID-19 declaration under Section 564(b)(1) of the Act, 21 U.S.C. section 360bbb-3(b)(1), unless the authorization is terminated or revoked sooner. Performed at [facility], [place] [address] [place], [place] [address]   TSH     Status: None   Collection Time: 06/17/19  4:43 PM  Result Value Ref Range   TSH 2.428 0.350 - 4.500 uIU/mL    Comment: Performed by a 3rd Generation assay with a functional sensitivity of <=0.01 uIU/mL. Performed at [facility], [place] [address], [place], [place] [address]   Vitamin B12     Status: None   Collection Time: 06/17/19  4:43 PM  Result Value Ref Range   Vitamin B-12 315 180 - 914 pg/mL    Comment: (NOTE) This assay is not validated for testing neonatal or myeloproliferative syndrome specimens for Vitamin B12 levels. Performed at [facility], [place] [address], [place], [place] [address]   Folate RBC     Status: None   Collection Time: 06/17/19  4:43 PM  Result Value Ref Range   Folate, Hemolysate 304.0 Not Estab. ng/mL   Hematocrit 51.0 37.5 - 51.0 %   Folate, RBC 596 >498 ng/mL    Comment:  (NOTE) Performed At: [facility] [place], [place] [id number] Rush Farmer MD AG:[id number]   Vit D     Status: Abnormal   Collection Time: 06/17/19  4:43 PM  Result Value Ref Range   Vit D, 25-Hydroxy 8.99 (L) 30 - 100 ng/mL    Comment: (NOTE) Vitamin D deficiency has been defined by the [place] practice guideline as a level of serum 25-OH  vitamin D less than  20 ng/mL (1,2). The Endocrine Society went on to  further define vitamin D insufficiency as a level between 21 and 29  ng/mL (2). 1. IOM (Institute of Medicine). 2010. Dietary reference intakes for  calcium and D. [place]: The [employer]. 2. Holick MF, Binkley [place], Bischoff-Ferrari HA, et al. Evaluation,  treatment, and prevention of vitamin D deficiency: an Endocrine  Society clinical practice guideline, JCEM. 2011 Jul; 96(7): 1911-30. Performed at [facility], [place] [address] [place], [place] [address]   CK     Status: None   Collection Time: 06/17/19  4:43 PM  Result Value Ref Range   Total CK 224 49 - 397 U/L    Comment: Performed at [facility], [place] [address], [place], [place] [address]  Uric acid     Status: None   Collection Time: 06/17/19  4:43 PM  Result Value Ref Range   Uric Acid, Serum 6.3 3.7 - 8.6 mg/dL    Comment: Performed at [facility] [address], [place], [place] [address]  Procalcitonin - Baseline     Status: None   Collection Time: 06/17/19  4:43 PM  Result Value Ref Range   Procalcitonin 0.35 ng/mL    Comment:        Interpretation: PCT (Procalcitonin) <= 0.5 ng/mL: Systemic infection (sepsis) is not likely. Local bacterial infection is possible. (NOTE)       Sepsis PCT Algorithm           Lower Respiratory Tract                                      Infection PCT Algorithm    ----------------------------     ----------------------------         PCT < 0.25 ng/mL                PCT <  0.10 ng/mL         Strongly encourage             Strongly discourage   discontinuation of antibiotics    initiation of antibiotics    ----------------------------     -----------------------------       PCT 0.25 - 0.50 ng/mL            PCT 0.10 - 0.25 ng/mL               OR       >80% decrease in PCT            Discourage initiation of                                            antibiotics      Encourage discontinuation           of antibiotics    ----------------------------     -----------------------------         PCT >= 0.50 ng/mL              PCT 0.26 - 0.50 ng/mL               AND        <80% decrease in PCT             Encourage initiation of  antibiotics       Encourage continuation           of antibiotics    ----------------------------     -----------------------------        PCT >= 0.50 ng/mL                  PCT > 0.50 ng/mL               AND         increase in PCT                  Strongly encourage                                      initiation of antibiotics    Strongly encourage escalation           of antibiotics                                     -----------------------------                                           PCT <= 0.25 ng/mL                                                 OR                                        > 80% decrease in PCT                                     Discontinue / Do not initiate                                             antibiotics Performed at [facility], [address] [place], [place] [address]   HIV Antibody (routine testing w rflx)     Status: None   Collection Time: 06/17/19  4:43 PM  Result Value Ref Range   HIV Screen 4th Generation wRfx NON REACTIVE NON REACTIVE    Comment: Performed at [facility] [address] [place], [place] [address]  Hemoglobin A1c     Status: Abnormal   Collection Time: 06/17/19  4:43 PM  Result Value Ref Range   Hgb A1c MFr  Bld 11.9 (H) 4.8 - 5.6 %    Comment: (NOTE) Pre diabetes:          5.7%-6.4% Diabetes:              >6.4% Glycemic control for   <7.0% adults with diabetes    Mean Plasma Glucose 294.83 mg/dL    Comment: Performed at [facility] [address], [place], [place] [address]  Brain natriuretic peptide  Status: None   Collection Time: 06/17/19  4:43 PM  Result Value Ref Range   B Natriuretic Peptide 84.1 0.0 - 100.0 pg/mL    Comment: Performed at [facility], [place] [address] [place], [place] [address]  I-stat chem 8, ED     Status: Abnormal   Collection Time: 06/17/19  4:50 PM  Result Value Ref Range   Sodium 135 135 - 145 mmol/L   Potassium 4.2 3.5 - 5.1 mmol/L   Chloride 99 98 - 111 mmol/L   BUN 26 (H) 6 - 20 mg/dL   Creatinine, Ser 1.50 (H) 0.61 - 1.24 mg/dL   Glucose, Bld 222 (H) 70 - 99 mg/dL   Calcium, Ion 1.06 (L) 1.15 - 1.40 mmol/L   TCO2 26 22 - 32 mmol/L   Hemoglobin 17.7 (H) 13.0 - 17.0 g/dL   HCT 52.0 39.0 - 52.0 %  CBG monitoring, ED     Status: Abnormal   Collection Time: 06/17/19  4:59 PM  Result Value Ref Range   Glucose-Capillary 184 (H) 70 - 99 mg/dL  CBG monitoring, ED     Status: Abnormal   Collection Time: 06/17/19  7:44 PM  Result Value Ref Range   Glucose-Capillary 188 (H) 70 - 99 mg/dL  Troponin I (High Sensitivity)     Status: Abnormal   Collection Time: 06/17/19  8:22 PM  Result Value Ref Range   Troponin I (High Sensitivity) 30 (H) <18 ng/L    Comment: (NOTE) Elevated high sensitivity troponin I (hsTnI) values and significant  changes across serial measurements may suggest ACS but many other  chronic and acute conditions are known to elevate hsTnI results.  Refer to the "Links" section for chest pain algorithms and additional  guidance. Performed at [facility], [place] [address], [place], [place] [address]   CBG monitoring, ED     Status: Abnormal   Collection Time: 06/17/19 10:18 PM  Result Value Ref Range    Glucose-Capillary 213 (H) 70 - 99 mg/dL  CBC Daily 5 days     Status: Abnormal   Collection Time: 06/18/19  5:10 AM  Result Value Ref Range   WBC 6.1 4.0 - 10.5 K/uL   RBC 5.41 4.22 - 5.81 MIL/uL   Hemoglobin 16.4 13.0 - 17.0 g/dL   HCT 48.7 39.0 - 52.0 %   MCV 90.0 80.0 - 100.0 fL   MCH 30.3 26.0 - 34.0 pg   MCHC 33.7 30.0 - 36.0 g/dL   RDW 11.9 11.5 - 15.5 %   Platelets 134 (L) 150 - 400 K/uL    Comment: REPEATED TO VERIFY   nRBC 0.0 0.0 - 0.2 %    Comment: Performed at [facility], [place] [address] [place], [place] [address]  CMP 3 days     Status: Abnormal   Collection Time: 06/18/19  5:10 AM  Result Value Ref Range   Sodium 134 (L) 135 - 145 mmol/L   Potassium 5.3 (H) 3.5 - 5.1 mmol/L   Chloride 100 98 - 111 mmol/L   CO2 22 22 - 32 mmol/L   Glucose, Bld 176 (H) 70 - 99 mg/dL   BUN 23 (H) 6 - 20 mg/dL   Creatinine, Ser 1.39 (H) 0.61 - 1.24 mg/dL   Calcium 8.5 (L) 8.9 - 10.3 mg/dL   Total Protein 6.8 6.5 - 8.1 g/dL   Albumin 3.0 (L) 3.5 - 5.0 g/dL   AST 40 15 - 41 U/L  ALT 23 0 - 44 U/L   Alkaline Phosphatase 61 38 - 126 U/L   Total Bilirubin 1.4 (H) 0.3 - 1.2 mg/dL   GFR calc non Af Amer 55 (L) >60 mL/min   GFR calc Af Amer >60 >60 mL/min   Anion gap 12 5 - 15    Comment: Performed at [facility] [address], [place], [place] [address]  Mg Daily 5 days     Status: Abnormal   Collection Time: 06/18/19  5:10 AM  Result Value Ref Range   Magnesium 2.6 (H) 1.7 - 2.4 mg/dL    Comment: Performed at [facility] [address], [place], [place] [address]  D-dimer, quantitative (not at [facility])     Status: Abnormal   Collection Time: 06/18/19  5:10 AM  Result Value Ref Range   D-Dimer, Quant 0.63 (H) 0.00 - 0.50 ug/mL-FEU    Comment: (NOTE) At the manufacturer cut-off of 0.50 ug/mL FEU, this assay has been documented to exclude PE with a sensitivity and negative predictive value of 97 to 99%.  At this time, this assay has not been approved by the  FDA to exclude DVT/VTE. Results should be correlated with clinical presentation. Performed at [facility], [place] [address] [place], [place] [address]   Ferritin     Status: Abnormal   Collection Time: 06/18/19  5:10 AM  Result Value Ref Range   Ferritin 389 (H) 24 - 336 ng/mL    Comment: Performed at [facility], [place] [address] [place], [place] [address]  Lactate dehydrogenase     Status: Abnormal   Collection Time: 06/18/19  5:10 AM  Result Value Ref Range   LDH 465 (H) 98 - 192 U/L    Comment: Performed at [facility], [place] [address], [place], [place] [address]  C-reactive protein     Status: Abnormal   Collection Time: 06/18/19  5:10 AM  Result Value Ref Range   CRP 18.5 (H) <1.0 mg/dL    Comment: Performed at [facility] [address] [place], [place] [address]  CBG monitoring, ED     Status: Abnormal   Collection Time: 06/18/19  7:56 AM  Result Value Ref Range   Glucose-Capillary 148 (H) 70 - 99 mg/dL  CBG monitoring, ED     Status: Abnormal   Collection Time: 06/18/19 12:43 PM  Result Value Ref Range   Glucose-Capillary 121 (H) 70 - 99 mg/dL  CBG monitoring, ED     Status: Abnormal   Collection Time: 06/18/19  5:52 PM  Result Value Ref Range   Glucose-Capillary 125 (H) 70 - 99 mg/dL  CBG monitoring, ED     Status: Abnormal   Collection Time: 06/18/19  9:32 PM  Result Value Ref Range   Glucose-Capillary 162 (H) 70 - 99 mg/dL   CT HEAD WO CONTRAST  Result Date: 06/17/2019 CLINICAL DATA:  Right-sided weakness, 2 days duration. Recent falling. EXAM: CT HEAD WITHOUT CONTRAST TECHNIQUE: Contiguous axial images were obtained from the base of the skull through the vertex without intravenous contrast. COMPARISON:  MRI 10/14/2017 FINDINGS: Brain: The brain shows a normal appearance without evidence of malformation, atrophy, old or acute small or large vessel infarction, mass lesion, hemorrhage, hydrocephalus or extra-axial collection. Vascular:  There is atherosclerotic calcification of the major vessels at the base of the brain. Skull: Normal.  No traumatic finding.  No focal bone lesion. Sinuses/Orbits: Sinuses are clear. Orbits  appear normal. Mastoids are clear. Other: None significant IMPRESSION: Normal head CT except for atherosclerotic calcification of the major vessels at the base of the brain. Electronically Signed   By: Nelson Chimes M.D.   On: 06/17/2019 14:41   MR BRAIN W WO CONTRAST  Result Date: 06/17/2019 CLINICAL DATA:  Neuro deficit. Subacute right-sided weakness. EXAM: MRI HEAD WITHOUT AND WITH CONTRAST TECHNIQUE: Multiplanar, multiecho pulse sequences of the brain and surrounding structures were obtained without and with intravenous contrast. CONTRAST:  27m GADAVIST GADOBUTROL 1 MMOL/ML IV SOLN COMPARISON:  CT head 06/17/2019. MRI 10/14/2017 FINDINGS: Brain: No acute infarction, hemorrhage, hydrocephalus, extra-axial collection or mass lesion. No significant chronic ischemia Normal enhancement postcontrast administration Vascular: Normal arterial flow voids Skull and upper cervical spine: No focal skeletal lesion. Sinuses/Orbits: Mild mucosal edema paranasal sinuses. Normal orbit Other: None IMPRESSION: Negative MRI head with contrast. Electronically Signed   By: CFranchot GalloM.D.   On: 06/17/2019 19:01   MR LUMBAR SPINE WO CONTRAST  Result Date: 06/18/2019 CLINICAL DATA:  Initial evaluation for unilateral right lower extremity weakness, tingling in toes. EXAM: MRI LUMBAR SPINE WITHOUT CONTRAST TECHNIQUE: Multiplanar, multisequence MR imaging of the lumbar spine was performed. No intravenous contrast was administered. COMPARISON:  Prior radiograph from 01/18/2012. FINDINGS: Segmentation: Standard. Lowest well-formed disc space labeled the L5-S1 level. Alignment: Physiologic with preservation of the normal lumbar lordosis. No listhesis. Vertebrae: Vertebral body height maintained without evidence for acute or chronic fracture.  Bone marrow signal intensity somewhat diffusely decreased on T1 weighted imaging, nonspecific, but most commonly related to anemia, smoking, or obesity. Subcentimeter benign hemangioma noted within the L4 vertebral body. No other discrete or worrisome osseous lesions. No abnormal marrow edema. Conus medullaris and cauda equina: Conus extends to the L1 level. Conus and cauda equina appear normal. Paraspinal and other soft tissues: Paraspinous soft tissues within normal limits. Partially visualized urinary bladder moderately distended. Visualized visceral structures otherwise unremarkable. Disc levels: L1-2: Disc desiccation without significant disc bulge. No canal or neural foraminal stenosis. No impingement. L2-3: Mild disc desiccation without significant disc bulge. No canal or neural foraminal stenosis. L3-4: Disc desiccation with minimal annular disc bulge. No significant canal or neural foraminal stenosis. No impingement. L4-5: Disc desiccation with minimal annular disc bulge. Superimposed small left foraminal disc protrusion closely approximates the exiting left L4 nerve root without frank neural impingement (series 4, image 11). No significant canal or lateral recess stenosis. Mild left greater than right L4 foraminal stenosis. L5-S1: Negative interspace. Mild epidural lipomatosis. No canal or lateral recess stenosis. Foramina remain patent. No impingement. IMPRESSION: 1. Small left foraminal disc protrusion at L4-5, closely approximating and potentially irritating the exiting left L4 nerve root. 2. Mild left greater than right L4 foraminal stenosis related to disc bulge and facet disease. 3. Additional minor for age degenerative disc disease elsewhere within the lumbar spine without significant stenosis or neural impingement. No other findings to explain patient's right lower extremity symptoms identified. 4. Diffusely decreased T1 marrow signal intensity, nonspecific, but most commonly related to anemia,  smoking, or obesity. Correlation with history and laboratory values recommended. Electronically Signed   By: BJeannine BogaM.D.   On: 06/18/2019 21:06   DG Chest Port 1 View  Result Date: 06/17/2019 CLINICAL DATA:  Chest pain EXAM: PORTABLE CHEST 1 VIEW COMPARISON:  June 05, 2019 FINDINGS: The heart size and mediastinal contours are within normal limits. Both lungs are clear. The visualized skeletal structures are unremarkable. IMPRESSION: No active disease. Electronically Signed  By: Prudencio Pair M.D.   On: 06/17/2019 12:12    Pending Labs Unresulted Labs (From admission, onward)    Start     Ordered   06/19/19 0500  Ferritin  Daily,   R     06/18/19 1415   06/19/19 0500  C-reactive protein  Daily,   R     06/18/19 1415   06/19/19 0500  CK  Tomorrow morning,   R     06/18/19 1544   06/18/19 0500  CBC Daily 5 days  Daily,   R    Question:  Specimen collection method  Answer:  Lab=Lab collect   06/17/19 1621   06/18/19 0500  CMP 3 days  Daily,   R    Question:  Specimen collection method  Answer:  Lab=Lab collect   06/17/19 1621   06/18/19 0500  Mg Daily 5 days  Daily,   R    Question:  Specimen collection method  Answer:  Lab=Lab collect   06/17/19 1621          Vitals/Pain Today's Vitals   06/18/19 1900 06/18/19 2100 06/18/19 2300 06/19/19 0000  BP: 126/67 127/73 118/71 132/69  Pulse:  78 76 74  Resp: (!) 22  18 (!) 25  Temp:      TempSrc:      SpO2:  98% 95% 96%  PainSc:        Isolation Precautions Airborne and Contact precautions  Medications Medications  senna-docusate (Senokot-S) tablet 2 tablet (has no administration in time range)  polyethylene glycol (MIRALAX / GLYCOLAX) packet 17 g (has no administration in time range)  apixaban (ELIQUIS) tablet 5 mg (5 mg Oral Given 06/18/19 2151)  0.9 %  sodium chloride infusion ( Intravenous Stopped 06/18/19 1819)  acetaminophen (TYLENOL) tablet 650 mg (650 mg Oral Given 06/18/19 1142)    Or  acetaminophen  (TYLENOL) suppository 650 mg ( Rectal See Alternative 06/18/19 1142)  docusate sodium (COLACE) capsule 100 mg (100 mg Oral Not Given 06/18/19 2149)  ondansetron (ZOFRAN) tablet 4 mg (4 mg Oral Given 06/18/19 1141)    Or  ondansetron (ZOFRAN) injection 4 mg ( Intravenous See Alternative 06/18/19 1141)  insulin glargine (LANTUS) injection 20 Units (20 Units Subcutaneous Not Given 06/18/19 2148)  insulin aspart (novoLOG) injection 0-15 Units (2 Units Subcutaneous Given 06/18/19 1817)  insulin aspart (novoLOG) injection 0-5 Units (0 Units Subcutaneous Not Given 06/18/19 2148)  insulin aspart (novoLOG) injection 3 Units (3 Units Subcutaneous Given 06/18/19 1818)  dexamethasone (DECADRON) tablet 6 mg (6 mg Oral Given 06/18/19 1142)  ezetimibe (ZETIA) tablet 10 mg (10 mg Oral Given 06/18/19 1600)  carvedilol (COREG) tablet 6.25 mg (6.25 mg Oral Given 06/18/19 1817)  amitriptyline (ELAVIL) tablet 20 mg (20 mg Oral Given 06/18/19 2151)  gadobutrol (GADAVIST) 1 MMOL/ML injection 7 mL (7 mLs Intravenous Contrast Given 06/17/19 1827)    Mobility walks     Focused Assessments Neuro Assessment Handoff:  Swallow screen pass? Yes    NIH Stroke Scale ( + Modified Stroke Scale Criteria)  Level of Consciousness (1a.)   : Alert, keenly responsive LOC Questions (1b. )   +: Answers both questions correctly LOC Commands (1c. )   + : Performs both tasks correctly Best Gaze (2. )  +: Normal Visual (3. )  +: No visual loss Facial Palsy (4. )    : Normal symmetrical movements Motor Arm, Left (5a. )   +: No drift Motor Arm, Right (5b. )   +:  No drift Motor Leg, Left (6a. )   +: No drift Motor Leg, Right (6b. )   +: No drift Limb Ataxia (7. ): Absent Sensory (8. )   +: Normal, no sensory loss Best Language (9. )   +: No aphasia Dysarthria (10. ): Normal Extinction/Inattention (11.)   +: No Abnormality Modified SS Total  +: 0 Complete NIHSS TOTAL: 0     Neuro Assessment: Exceptions to [facility] Neuro  Checks:      Last Documented NIHSS Modified Score: 0 (06/17/19 1854) Has TPA been given? No If patient is a Neuro Trauma and patient is going to OR before floor call report to [facility] nurse: [phone] or [phone]     R Recommendations: See Admitting Provider Note  Report given to:   Additional Notes: all scans are negative

## 2019-06-19 NOTE — Progress Notes (Signed)
Pharmacy Antibiotic Note  Calvin [name] is a 58 y.o. male admitted on 06/17/2019 with COVID positive, requiring supplemental oxygen, evidence of lower respiratory infection on chest imaging.  Pharmacy has been consulted for Remdesivir dosing. ALT 17  Plan: Remdesivir 225m IV x 1, then 1057mIV daily X 4 Monitor daily ALT     Temp (24hrs), Avg:98.5 F (36.9 C), Min:98.3 F (36.8 C), Max:98.7 F (37.1 C)  Recent Labs  Lab 06/17/19 1240 06/17/19 1650 06/18/19 0510 06/19/19 0356  WBC 5.4  --  6.1 5.2  CREATININE 1.65* 1.50* 1.39* 1.30*    CrCl cannot be calculated (Unknown ideal weight.).    No Known Allergies  Karol Skarzynski A. PiLevada DyPharmD, BCPS, FNKF Clinical Pharmacist [place] Please utilize Amion for appropriate phone number to reach the unit pharmacist ([place]  06/19/2019 1:16 PM

## 2019-06-19 NOTE — Progress Notes (Signed)
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  He was found to have SIRS coronavirus 2 infection.  MRI of the brain was negative for stroke.  MRI of the lumbar spine:Small left foraminal disc protrusion at L4-5, closely approximating and potentially irritating the exiting left L4 nerve root. Mild left greater than right L4 foraminal stenosis related to disc bulge and facet disease.      Assessment & Plan:   Principal Problem:   Right sided weakness Active Problems:   DM (diabetes mellitus) (HCC)   HLD (hyperlipidemia)   Chronic systolic heart failure (HCC)   Essential hypertension   OSA on CPAP   Onychomycosis of multiple toenails with type 2 diabetes mellitus and peripheral neuropathy (HCC)   MCI (mild cognitive impairment)   Diabetic neuropathy (HCC)   AF (paroxysmal atrial fibrillation) (HCC)   AKI (acute kidney injury) ([place])   COVID-19 virus detected  1-Right lower extremity weakness: MRI of brain negative MRI of lumbar spine:Small left foraminal disc protrusion at L4-5, closely approximating and potentially irritating the exiting left L4 nerve root. Mild left greater than right L4 foraminal stenosis related to disc bulge and facet disease. Prior episode of the same. On any steroids. Will consult neurosurgery.  2-COVID-19 infection: Patient presented with elevated inflammatory markers, CRP at 18. He report cough, will start Remdesivir. Will check Chest x ray.  Continue to monitor inflammatory markers. On dexamethasone for hypoxemia Patient with tachypnea. COVID-19 Labs  Recent Labs    06/17/19 1300 06/18/19 0510  06/19/19 0356  DDIMER  --  0.63*  --   FERRITIN  --  389* 368*  LDH  --  465*  --   CRP 16.5* 18.5* 13.6*    Lab Results  Component Value Date   SARSCOV2NAA POSITIVE (A) 06/17/2019   SARSCOV2NAA NOT DETECTED 06/05/2019   [place] NEGATIVE 01/23/2019    3-AKI on chronic kidney disease a stage III AB: Patient creatinine baseline 1.2.  Peak of 1.6 improved with IV fluids. Creatinine today 1.3.  4-Diabetes type 2: Poorly controlled Hemoglobin A1c 11.9 Continue with Lantus and sliding scale insulin  5-peripheral neuropathy: Continue with amitriptyline  Chronic systolic heart failure: Compensated Normal saline lock fluids  Hyperlipidemia: Continue with Zetia  Hypertension: Continue with Coreg.  Holding lisinopril due to AKI. Mild elevation of troponin: Suspect related to acute illness. chest pain-free Hyperkalemia; resolved with fluids.  Estimated body mass index is 28.29 kg/m as calculated from the following:   Height as of 05/27/19: 5' 4"  (1.626 m).   Weight as of 05/27/19: 74.8 kg.   DVT prophylaxis: on eliquis.  Code Status: full code Family Communication: care discussed with Patient.  Disposition Plan: Remain I the hospital for treatment of Covid 19 infection, AKI, hypoxemia, and tachypnea.  Consultants:   Neurosurgery   Procedures:   none  Antimicrobials:    Subjective: He report Right LE weakness stable not worse. He report prior episodes, flare at times. He report tingling pain B/L feet. He also report shooting pain form right arm to lower right Leg He report feeling weak , tired. Report cough.  He report poor oral   Objective: Vitals:   06/19/19 0000 06/19/19 0101 06/19/19  0400 06/19/19 0800  BP: 132/69 140/76 131/79 (!) 134/98  Pulse: 74 78 79 85  Resp: (!) 25 (!) 25 (!) 24 (!) 24  Temp:  98.3 F (36.8 C) 98.6 F (37 C) 98.7 F (37.1 C)  TempSrc:  Oral Oral Oral  SpO2: 96% 96% 94% 92%    Intake/Output Summary (Last 24 hours) at  06/19/2019 1330 Last data filed at 06/18/2019 1819 Gross per 24 hour  Intake 740 ml  Output 300 ml  Net 440 ml   There were no vitals filed for this visit.  Examination:  General exam: Appears calm and comfortable  Respiratory system: Clear to auscultation. Respiratory effort normal. Cardiovascular system: S1 & S2 heard, RRR. No JVD, murmurs, rubs, gallops or clicks. No pedal edema. Gastrointestinal system: Abdomen is nondistended, soft and nontender. No organomegaly or masses felt. Normal bowel sounds heard. Central nervous system: Alert and oriented. No focal neurological deficits. Extremities: right LE 4/5 left 5/5 Skin: No rashes, lesions or ulcers    Data Reviewed: I have personally reviewed following labs and imaging studies  CBC: Recent Labs  Lab 06/17/19 1240 06/17/19 1643 06/17/19 1650 06/18/19 0510 06/19/19 0356  WBC 5.4  --   --  6.1 5.2  NEUTROABS 3.9  --   --   --   --   HGB 16.3  --  17.7* 16.4 14.9  HCT 47.6 51.0 52.0 48.7 43.5  MCV 89.1  --   --  90.0 88.4  PLT 142*  --   --  134* 081*   Basic Metabolic Panel: Recent Labs  Lab 06/17/19 1240 06/17/19 1650 06/18/19 0510 06/19/19 0356  NA 133* 135 134* 134*  K 4.0 4.2 5.3* 4.4  CL 97* 99 100 101  CO2 22  --  22 20*  GLUCOSE 254* 222* 176* 213*  BUN 21* 26* 23* 23*  CREATININE 1.65* 1.50* 1.39* 1.30*  CALCIUM 8.6*  --  8.5* 8.2*  MG 2.4  --  2.6* 2.5*  PHOS 3.1  --   --   --    GFR: CrCl cannot be calculated (Unknown ideal weight.). Liver Function Tests: Recent Labs  Lab 06/17/19 1240 06/18/19 0510 06/19/19 0356  AST 23 40 29  ALT 16 23 17   ALKPHOS 66 61 49  BILITOT 0.6 1.4* 0.5  PROT 7.2 6.8 6.7  ALBUMIN 3.4* 3.0* 2.6*   No results for input(s): LIPASE, AMYLASE in the last 168 hours. No results for input(s): AMMONIA in the last 168 hours. Coagulation Profile: Recent Labs  Lab 06/17/19 1240  INR 1.0   Cardiac Enzymes: Recent Labs  Lab 06/17/19 1643 06/19/19 0356  CKTOTAL  224 138   BNP (last 3 results) No results for input(s): PROBNP in the last 8760 hours. HbA1C: Recent Labs    06/17/19 1643  HGBA1C 11.9*   CBG: Recent Labs  Lab 06/18/19 1752 06/18/19 2132 06/19/19 0105 06/19/19 0750 06/19/19 1144  GLUCAP 125* 162* 195* 174* 80   Lipid Profile: No results for input(s): CHOL, HDL, LDLCALC, TRIG, CHOLHDL, LDLDIRECT in the last 72 hours. Thyroid Function Tests: Recent Labs    06/17/19 1643  TSH 2.428   Anemia Panel: Recent Labs    06/17/19 1643 06/18/19 0510 06/19/19 0356  VITAMINB12 315  --   --   FERRITIN  --  389* 368*   Sepsis Labs: Recent Labs  Lab 06/17/19 1643  PROCALCITON 0.35    Recent Results (from the past 240 hour(s))  SARS CORONAVIRUS 2 (TAT  6-24 HRS) Nasopharyngeal Nasopharyngeal Swab     Status: Abnormal   Collection Time: 06/17/19  4:20 PM   Specimen: Nasopharyngeal Swab  Result Value Ref Range Status   SARS Coronavirus 2 POSITIVE (A) NEGATIVE Final    Comment: RESULT CALLED TO, READ BACK BY AND VERIFIED WITH: RN MONIQUE DAVIS AT [place] ON 06/17/2019 (NOTE) SARS-CoV-2 target nucleic acids are DETECTED. The SARS-CoV-2 RNA is generally detectable in upper and lower respiratory specimens during the acute phase of infection. Positive results are indicative of the presence of SARS-CoV-2 RNA. Clinical correlation with patient history and other diagnostic information is  necessary to determine patient infection status. Positive results do not rule out bacterial infection or co-infection with other viruses.  The expected result is Negative. Fact Sheet for Patients: SugarRoll.be Fact Sheet for Healthcare Providers: https://www.woods-mathews.com/ This test is not yet approved or cleared by the [place] FDA and  has been authorized for detection and/or diagnosis of SARS-CoV-2 by FDA under an Emergency Use Authorization (EUA). This EUA will remain  in  effect (meaning this t est can be used) for the duration of the COVID-19 declaration under Section 564(b)(1) of the Act, 21 U.S.C. section 360bbb-3(b)(1), unless the authorization is terminated or revoked sooner. Performed at [facility], [place] [address] [place], [place] [address]          Radiology Studies: CT HEAD WO CONTRAST  Result Date: 06/17/2019 CLINICAL DATA:  Right-sided weakness, 2 days duration. Recent falling. EXAM: CT HEAD WITHOUT CONTRAST TECHNIQUE: Contiguous axial images were obtained from the base of the skull through the vertex without intravenous contrast. COMPARISON:  MRI 10/14/2017 FINDINGS: Brain: The brain shows a normal appearance without evidence of malformation, atrophy, old or acute small or large vessel infarction, mass lesion, hemorrhage, hydrocephalus or extra-axial collection. Vascular: There is atherosclerotic calcification of the major vessels at the base of the brain. Skull: Normal.  No traumatic finding.  No focal bone lesion. Sinuses/Orbits: Sinuses are clear. Orbits appear normal. Mastoids are clear. Other: None significant IMPRESSION: Normal head CT except for atherosclerotic calcification of the major vessels at the base of the brain. Electronically Signed   By: Nelson Chimes M.D.   On: 06/17/2019 14:41   MR BRAIN W WO CONTRAST  Result Date: 06/17/2019 CLINICAL DATA:  Neuro deficit. Subacute right-sided weakness. EXAM: MRI HEAD WITHOUT AND WITH CONTRAST TECHNIQUE: Multiplanar, multiecho pulse sequences of the brain and surrounding structures were obtained without and with intravenous contrast. CONTRAST:  47m GADAVIST GADOBUTROL 1 MMOL/ML IV SOLN COMPARISON:  CT head 06/17/2019. MRI 10/14/2017 FINDINGS: Brain: No acute infarction, hemorrhage, hydrocephalus, extra-axial collection or mass lesion. No significant chronic ischemia Normal enhancement postcontrast administration Vascular: Normal arterial flow voids Skull and upper cervical spine: No focal  skeletal lesion. Sinuses/Orbits: Mild mucosal edema paranasal sinuses. Normal orbit Other: None IMPRESSION: Negative MRI head with contrast. Electronically Signed   By: CFranchot GalloM.D.   On: 06/17/2019 19:01   MR LUMBAR SPINE WO CONTRAST  Result Date: 06/18/2019 CLINICAL DATA:  Initial evaluation for unilateral right lower extremity weakness, tingling in toes. EXAM: MRI LUMBAR SPINE WITHOUT CONTRAST TECHNIQUE: Multiplanar, multisequence MR imaging of the lumbar spine was performed. No intravenous contrast was administered. COMPARISON:  Prior radiograph from 01/18/2012. FINDINGS: Segmentation: Standard. Lowest well-formed disc space labeled the L5-S1 level. Alignment: Physiologic with preservation of the normal lumbar lordosis. No listhesis. Vertebrae: Vertebral body height maintained without evidence for acute or chronic fracture. Bone marrow signal intensity  somewhat diffusely decreased on T1 weighted imaging, nonspecific, but most commonly related to anemia, smoking, or obesity. Subcentimeter benign hemangioma noted within the L4 vertebral body. No other discrete or worrisome osseous lesions. No abnormal marrow edema. Conus medullaris and cauda equina: Conus extends to the L1 level. Conus and cauda equina appear normal. Paraspinal and other soft tissues: Paraspinous soft tissues within normal limits. Partially visualized urinary bladder moderately distended. Visualized visceral structures otherwise unremarkable. Disc levels: L1-2: Disc desiccation without significant disc bulge. No canal or neural foraminal stenosis. No impingement. L2-3: Mild disc desiccation without significant disc bulge. No canal or neural foraminal stenosis. L3-4: Disc desiccation with minimal annular disc bulge. No significant canal or neural foraminal stenosis. No impingement. L4-5: Disc desiccation with minimal annular disc bulge. Superimposed small left foraminal disc protrusion closely approximates the exiting left L4 nerve root  without frank neural impingement (series 4, image 11). No significant canal or lateral recess stenosis. Mild left greater than right L4 foraminal stenosis. L5-S1: Negative interspace. Mild epidural lipomatosis. No canal or lateral recess stenosis. Foramina remain patent. No impingement. IMPRESSION: 1. Small left foraminal disc protrusion at L4-5, closely approximating and potentially irritating the exiting left L4 nerve root. 2. Mild left greater than right L4 foraminal stenosis related to disc bulge and facet disease. 3. Additional minor for age degenerative disc disease elsewhere within the lumbar spine without significant stenosis or neural impingement. No other findings to explain patient's right lower extremity symptoms identified. 4. Diffusely decreased T1 marrow signal intensity, nonspecific, but most commonly related to anemia, smoking, or obesity. Correlation with history and laboratory values recommended. Electronically Signed   By: Jeannine Boga M.D.   On: 06/18/2019 21:06        Scheduled Meds: . amitriptyline  20 mg Oral QHS  . apixaban  5 mg Oral BID  . carvedilol  6.25 mg Oral BID WC  . dexamethasone  6 mg Oral Daily  . docusate sodium  100 mg Oral BID  . ezetimibe  10 mg Oral Daily  . insulin aspart  0-15 Units Subcutaneous TID WC  . insulin aspart  0-5 Units Subcutaneous QHS  . insulin aspart  3 Units Subcutaneous TID WC  . insulin glargine  20 Units Subcutaneous QHS   Continuous Infusions: . remdesivir 200 mg in sodium chloride 0.9% 250 mL IVPB     Followed by  . [START ON 06/20/2019] remdesivir 100 mg in NS 100 mL       LOS: 0 days    Time spent: 35 minutes.     Elmarie Shiley, MD [facility]   If 7PM-7AM, please contact night-coverage [email or web address] Password [email or web address] 06/19/2019, 1:30 PM

## 2019-06-19 NOTE — Evaluation (Addendum)
Occupational Therapy Evaluation Patient Details Name: Calvin [name] MRN: [id number] DOB: [birth year] Today's Date: 06/19/2019    History of Present Illness 58 y.o. 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 fib on Eliquis, obstructive sleep apnea, HLD came to the hospital with complaints of right-sided weakness of upper and lower extremity. CT negative.   Clinical Impression   This 57 y/o male presents with the above. PTA pt reports mostly independent with ADL and functional mobility, reports intermittent use of SPC/RW. Pt presenting with overall weakness (pt endorses continued R side weakness > L side), lightheadedness with sitting EOB, and generally feeling unwell/nausea. Pt agreeable to sitting EOB with therapies but only able to tolerate sitting approx 5 min with minguard assist - pt reports increased lightheadedness and request need to return to supine. BP monitored (see below), pt on RA with O2 sats stable. Am hopeful pt will progress well once nausea/lightheadedness improves. Pending progress pt may be able to return home with [facility] services, if pt with slower progress may require ST SNF initially. Will follow while acutely admitted.   BP upon entry to room: 134/98 (108)  Initially sitting EOB: 138/103 (116) Retaken while sitting EOB: 100/62 (74) After return to supine: 118/86 (93)     Follow Up Recommendations  Home health OT;Supervision/Assistance - 24 hour(HH vs SNF, pending progress)    Equipment Recommendations  3 in 1 bedside commode;Other (comment)(to be further assessed)           Precautions / Restrictions Precautions Precautions: Fall;Other (comment) Precaution Comments: watch vitals Restrictions Weight Bearing Restrictions: No      Mobility Bed Mobility Overal bed mobility: Needs Assistance Bed Mobility: Supine to Sit;Sit to Supine     Supine to sit: Min guard Sit  to supine: Min guard   General bed mobility comments: for lines and safety, increased time/effort  Transfers                 General transfer comment: deferred today, pt with increased lightheadedness seated EOB and requesting return to supine    Balance Overall balance assessment: Needs assistance Sitting-balance support: Feet supported Sitting balance-Leahy Scale: Fair Sitting balance - Comments: minguard for safety and pt often relying on UE support                                   ADL either performed or assessed with clinical judgement   ADL Overall ADL's : Needs assistance/impaired Eating/Feeding: Set up;Sitting   Grooming: Set up;Min guard;Wash/dry face;Sitting   Upper Body Bathing: Min guard;Minimal assistance;Sitting       Upper Body Dressing : Minimal assistance;Min guard;Sitting                     General ADL Comments: pt only able to tolerate sitting EOB for approx 5 min during this session, limited due to weakness, increased nausea and lightheadedness seated EOB, overall feeling unwell      Vision Baseline Vision/History: Wears glasses Wears Glasses: Reading only Patient Visual Report: No change from baseline Additional Comments: pt denies vision changes, unable to assess today due to lightheadedness, will continue to monitor/assess     Perception     Praxis      Pertinent Vitals/Pain Pain Assessment: No/denies pain     Hand Dominance Right   Extremity/Trunk Assessment Upper Extremity Assessment Upper Extremity Assessment: Generalized  weakness;RUE deficits/detail RUE Deficits / Details: pt endorses continued RUE weakness, denies numbness/tingling; did not formally assess as pt with increased nausea/lightheadedness seated EOB, will continue to asess    Lower Extremity Assessment Lower Extremity Assessment: Defer to PT evaluation       Communication Communication Communication: No difficulties   Cognition  Arousal/Alertness: Awake/alert Behavior During Therapy: WFL for tasks assessed/performed Overall Cognitive Status: Within Functional Limits for tasks assessed                                     General Comments       Exercises     Shoulder Instructions      Home Living Family/patient expects to be discharged to:: Private residence Living Arrangements: Other relatives Available Help at Discharge: Family;Available 24 hours/day Type of Home: Apartment Home Access: Stairs to enter Entrance Stairs-Number of Steps: 15 Entrance Stairs-Rails: Left Home Layout: One level     Bathroom Shower/Tub: Teacher, early years/pre: Standard     Home Equipment: Cane - single point;Walker - 2 wheels          Prior Functioning/Environment Level of Independence: Independent with assistive device(s)        Comments: occassional use of RW vs cane for amb        OT Problem List: Decreased strength;Decreased range of motion;Decreased activity tolerance;Impaired balance (sitting and/or standing);Decreased knowledge of use of DME or AE;Impaired UE functional use      OT Treatment/Interventions: Self-care/ADL training;Therapeutic exercise;Neuromuscular education;Energy conservation;DME and/or AE instruction;Therapeutic activities;Cognitive remediation/compensation;Visual/perceptual remediation/compensation;Patient/family education;Balance training    OT Goals(Current goals can be found in the care plan section) Acute Rehab OT Goals Patient Stated Goal: not stated OT Goal Formulation: With patient Time For Goal Achievement: 07/03/19 Potential to Achieve Goals: Good  OT Frequency: Min 2X/week   Barriers to D/C:            Co-evaluation PT/OT/SLP Co-Evaluation/Treatment: Yes Reason for Co-Treatment: For patient/therapist safety   OT goals addressed during session: ADL's and self-care      AM-PAC OT "6 Clicks" Daily Activity     Outcome Measure Help from  another person eating meals?: None Help from another person taking care of personal grooming?: A Little Help from another person toileting, which includes using toliet, bedpan, or urinal?: Total Help from another person bathing (including washing, rinsing, drying)?: A Lot Help from another person to put on and taking off regular upper body clothing?: A Lot Help from another person to put on and taking off regular lower body clothing?: Total 6 Click Score: 13   End of Session Nurse Communication: Mobility status  Activity Tolerance: Patient limited by fatigue;Other (comment)(limited due to lightheadedness) Patient left: in bed;with call bell/phone within reach  OT Visit Diagnosis: Muscle weakness (generalized) (M62.81);Other symptoms and signs involving the nervous system (R29.898)                Time: 1941-7408 OT Time Calculation (min): 16 min Charges:  OT General Charges $OT Visit: 1 Visit OT Evaluation $OT Eval Moderate Complexity: 1 Mod  Calvin [name], OT [employer] Pager [phone] Office [phone]   Calvin [name] 06/19/2019, 1:07 PM

## 2019-06-19 NOTE — Progress Notes (Signed)
Called in regards to this patient's lumbar MRI. It was reported to me that the patient has had right lower extremity weakness for the last several days.  MRI lumbar is unremarkable with very mild left foraminal narrowing L4-5 and the patient has right leg weakness. MRI brain was also unremarkable. If further workup is warranted could consider thoracic mri.

## 2019-06-19 NOTE — Progress Notes (Signed)
Physical Therapy Treatment Patient Details Name: Calvin [name] MRN: [id number] DOB: 12/29/1960 Today's Date: 06/19/2019    History of Present Illness 58 y.o. male with medical history significant of combined systolic and diastolic CHF EF 15%, HTN, DM2-uncontrolled insulin-dependent, GERD, A. fib on Eliquis, obstructive sleep apnea, HLD came to the hospital with complaints of right-sided weakness of upper and lower extremity. CT negative.    PT Comments    Pt extremely limited in session today by nausea. Pt with history of vertigo during last session. PT will have vestibular specialist evaluate during next session. Pt also with possible orthostatic hypotension (see OT note). Pt is min guard for bed mobility. D/c plans will likely be appropriate when nausea clears. PT will continue to progress mobility.    Follow Up Recommendations  Home health PT;Supervision for mobility/OOB     Equipment Recommendations  None recommended by PT    Recommendations for Other Services       Precautions / Restrictions Precautions Precautions: Fall;Other (comment) Precaution Comments: watch vitals Restrictions Weight Bearing Restrictions: No    Mobility  Bed Mobility Overal bed mobility: Needs Assistance Bed Mobility: Supine to Sit;Sit to Supine     Supine to sit: Min guard Sit to supine: Min guard   General bed mobility comments: for lines and safety, increased time/effort  Transfers                 General transfer comment: deferred today, pt with increased lightheadedness seated EOB and requesting return to supine     Balance Overall balance assessment: Needs assistance Sitting-balance support: Feet supported Sitting balance-Leahy Scale: Fair Sitting balance - Comments: minguard for safety and pt often relying on UE support                                    Cognition Arousal/Alertness: Awake/alert Behavior During Therapy: WFL for tasks  assessed/performed Overall Cognitive Status: Within Functional Limits for tasks assessed                                           General Comments General comments (skin integrity, edema, etc.): pt with nausea in sitting, hx of vertigo,       Pertinent Vitals/Pain Pain Assessment: No/denies pain    Home Living Family/patient expects to be discharged to:: Private residence Living Arrangements: Other relatives Available Help at Discharge: Family;Available 24 hours/day Type of Home: Apartment Home Access: Stairs to enter Entrance Stairs-Rails: Left Home Layout: One level Home Equipment: Cane - single point;Walker - 2 wheels      Prior Function Level of Independence: Independent with assistive device(s)      Comments: occassional use of RW vs cane for amb   PT Goals (current goals can now be found in the care plan section) Acute Rehab PT Goals Patient Stated Goal: not stated PT Goal Formulation: With patient Time For Goal Achievement: 07/02/19 Potential to Achieve Goals: Good Progress towards PT goals: Not progressing toward goals - comment(limited by nausea)    Frequency    Min 3X/week      PT Plan      Co-evaluation PT/OT/SLP Co-Evaluation/Treatment: Yes Reason for Co-Treatment: For patient/therapist safety PT goals addressed during session: Mobility/safety with mobility        AM-PAC PT "6 Clicks" Mobility   Outcome Measure  Help needed turning from your back to your side while in a flat bed without using bedrails?: None Help needed moving from lying on your back to sitting on the side of a flat bed without using bedrails?: A Little Help needed moving to and from a bed to a chair (including a wheelchair)?: A Little Help needed standing up from a chair using your arms (e.g., wheelchair or bedside chair)?: A Little Help needed to walk in hospital room?: A Lot Help needed climbing 3-5 steps with a railing? : A Lot 6 Click Score: 17    End  of Session   Activity Tolerance: Treatment limited secondary to medical complications (Comment)(orthostatic) Patient left: in bed;with call bell/phone within reach Nurse Communication: Mobility status PT Visit Diagnosis: Other abnormalities of gait and mobility (R26.89);History of falling (Z91.81)     Time: 0350-0938 PT Time Calculation (min) (ACUTE ONLY): 16 min  Charges:  $Therapeutic Activity: 8-22 mins                     Gustavia Carie B. Migdalia Dk PT, DPT Acute Rehabilitation Services Pager [phone] Office [phone]    [place] 06/19/2019, 2:49 PM

## 2019-06-19 NOTE — Consult Note (Signed)
   [facility] Inpatient Consult   06/19/2019  Calvin [name] Jul 31, 1960 [id number]   Patient is currently active with [facility] for chronic disease management services.  Patient has been engaged by a [facility] Pharmacist.  Patient in the [facility] [place].    Our community based plan of care has focused on disease management and community resource support.    Patient admitted with lower extremity weakness and also COVID-19 positive. Patient has a HX of but not limited to HF, Atrial Fib, and Diabetes Type 2. ,  Primary Care Provider: Sherrie Mustache, MD [facility], this office is listed to provide the Transition of Care follow up.  Plan:  Notify [facility] CM staff of admission and follow for progress and needs.  Of note, [facility] Care Management services does not replace or interfere with any services that are needed or arranged by inpatient [facility] care management team.  For additional questions or referrals please contact:  Natividad Brood, RN BSN [facility] Liaison  [phone] business mobile phone Toll free office [phone]  Fax number: [phone] [name]

## 2019-06-19 NOTE — Progress Notes (Signed)
Calvin [name] is a 58 y.o. male patient admitted from ED awake, alert - oriented  X 4 - no acute distress noted.  VSS - Blood pressure 140/76, pulse 78, temperature 98.3 F (36.8 C), temperature source Oral, resp. rate (!) 25, SpO2 96 %.    IV in place, occlusive dsg intact without redness.  Orientation to room, and floor completed with information packet given to patient/family.  Patient declined safety video at this time.  Admission INP armband ID verified with patient/family, and in place.   SR up x 2, fall assessment complete, with patient and family able to verbalize understanding of risk associated with falls, and verbalized understanding to call nsg before up out of bed.  Call light within reach, patient able to voice, and demonstrate understanding.  Skin, clean-dry- intact without evidence of bruising, or skin tears.   No evidence of skin break down noted on exam.     Will cont to eval and treat per MD orders.  [place], RN 06/19/2019 1:38 AM

## 2019-06-20 LAB — GLUCOSE, CAPILLARY
Glucose-Capillary: 136 mg/dL — ABNORMAL HIGH (ref 70–99)
Glucose-Capillary: 153 mg/dL — ABNORMAL HIGH (ref 70–99)
Glucose-Capillary: 189 mg/dL — ABNORMAL HIGH (ref 70–99)
Glucose-Capillary: 283 mg/dL — ABNORMAL HIGH (ref 70–99)

## 2019-06-20 LAB — CBC
HCT: 41.7 % (ref 39.0–52.0)
Hemoglobin: 14.6 g/dL (ref 13.0–17.0)
MCH: 30.3 pg (ref 26.0–34.0)
MCHC: 35 g/dL (ref 30.0–36.0)
MCV: 86.5 fL (ref 80.0–100.0)
Platelets: 146 10*3/uL — ABNORMAL LOW (ref 150–400)
RBC: 4.82 MIL/uL (ref 4.22–5.81)
RDW: 11.7 % (ref 11.5–15.5)
WBC: 5.9 10*3/uL (ref 4.0–10.5)
nRBC: 0 % (ref 0.0–0.2)

## 2019-06-20 LAB — COMPREHENSIVE METABOLIC PANEL
ALT: 29 U/L (ref 0–44)
AST: 46 U/L — ABNORMAL HIGH (ref 15–41)
Albumin: 2.4 g/dL — ABNORMAL LOW (ref 3.5–5.0)
Alkaline Phosphatase: 54 U/L (ref 38–126)
Anion gap: 11 (ref 5–15)
BUN: 23 mg/dL — ABNORMAL HIGH (ref 6–20)
CO2: 21 mmol/L — ABNORMAL LOW (ref 22–32)
Calcium: 8.2 mg/dL — ABNORMAL LOW (ref 8.9–10.3)
Chloride: 101 mmol/L (ref 98–111)
Creatinine, Ser: 1.26 mg/dL — ABNORMAL HIGH (ref 0.61–1.24)
GFR calc Af Amer: >60 mL/min (ref >60)
GFR calc non Af Amer: >60 mL/min (ref >60)
Glucose, Bld: 134 mg/dL — ABNORMAL HIGH (ref 70–99)
Potassium: 4.2 mmol/L (ref 3.5–5.1)
Sodium: 133 mmol/L — ABNORMAL LOW (ref 135–145)
Total Bilirubin: 0.4 mg/dL (ref 0.3–1.2)
Total Protein: 6.1 g/dL — ABNORMAL LOW (ref 6.5–8.1)

## 2019-06-20 LAB — FERRITIN: Ferritin: 405 ng/mL — ABNORMAL HIGH (ref 24–336)

## 2019-06-20 LAB — MAGNESIUM: Magnesium: 2.6 mg/dL — ABNORMAL HIGH (ref 1.7–2.4)

## 2019-06-20 LAB — C-REACTIVE PROTEIN: CRP: 13.1 mg/dL — ABNORMAL HIGH (ref <1.0)

## 2019-06-20 MED ORDER — TRAMADOL HCL 50 MG PO TABS
50.0000 mg | ORAL_TABLET | Freq: Four times a day (QID) | ORAL | Status: DC | PRN
  Administered 2019-06-20 – 2019-06-23 (×2): 50 mg via ORAL
  Filled 2019-06-20 (×2): qty 1

## 2019-06-20 MED ORDER — POLYETHYLENE GLYCOL 3350 17 G PO PACK
17.0000 g | PACK | Freq: Every day | ORAL | Status: DC
  Administered 2019-06-20 – 2019-06-28 (×9): 17 g via ORAL
  Filled 2019-06-20 (×9): qty 1

## 2019-06-20 MED ORDER — SENNOSIDES-DOCUSATE SODIUM 8.6-50 MG PO TABS
2.0000 | ORAL_TABLET | Freq: Every day | ORAL | Status: DC
  Administered 2019-06-20 – 2019-06-29 (×10): 2 via ORAL
  Filled 2019-06-20 (×11): qty 2

## 2019-06-20 NOTE — Progress Notes (Signed)
Patient had $160 in cash and food stamp card that he wanted given to [employer]. It was put in a bag and taken to her at the main entrance.  Dene Gentry, RN

## 2019-06-20 NOTE — Progress Notes (Signed)
PT Note Orthostatic BPs  Supine 103/67, 76 bpm  Sitting 100/60, 76 bpm  Standing Would not register  Standing after3 min Would not register   After pt transferred to chair with feet down, BP 82/56 with 75 bpm;  After pt positioned in chair with feet reclined, BP 96/59 with 74 bpm.  O 2 sat 92% on 2L at rest.  Dropped to 90% once  In chair but after a few min 94% sitting at rest inchair. Pt happy to be up.  Full note to follow.   Erminia Mcnew W,PT Acute Rehabilitation Services Pager:  [phone]  Office:  [phone]

## 2019-06-20 NOTE — Progress Notes (Addendum)
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  He was found to have SIRS coronavirus 2 infection.  MRI of the brain was negative for stroke.  MRI of the lumbar spine:Small left foraminal disc protrusion at L4-5, closely approximating and potentially irritating the exiting left L4 nerve root. Mild left greater than right L4 foraminal stenosis related to disc bulge and facet disease.   Assessment & Plan:   Principal Problem:   Right sided weakness Active Problems:   DM (diabetes mellitus) (HCC)   HLD (hyperlipidemia)   Chronic systolic heart failure (HCC)   Essential hypertension   OSA on CPAP   Onychomycosis of multiple toenails with type 2 diabetes mellitus and peripheral neuropathy (HCC)   MCI (mild cognitive impairment)   Diabetic neuropathy (HCC)   AF (paroxysmal atrial fibrillation) (HCC)   AKI (acute kidney injury) ([place])   COVID-19 virus detected  1-Right lower extremity weakness: -MRI of brain negative. -MRI of lumbar spine:Small left foraminal disc protrusion at L4-5, closely approximating and potentially irritating the exiting left L4 nerve root. Mild left greater than right L4 foraminal stenosis related to disc bulge and facet disease. Prior episode of the same. -On  steroids. -MRI thoracic spine ordered. Showed; Multifocal disc protrusions involving the T3-4 through T9-10 interspaces as above, most prominent of which is positioned at the T7-8 level. No significant spinal stenosis. Findings could contribute to underlying back pain. -Will contact neurosurgery again.  Discussed with PA, from neurosurgery, MRI finding does not  explain weakness. Nothing that will need surgery. Patient needs to follow up with PCP.   2-COVID-19 infection: Patient presented with elevated inflammatory markers, CRP at 18. He report cough, chest x ray with opacification left bases.  Continue to monitor inflammatory markers. On dexamethasone for hypoxemia.  Started on remdesivir day 2.   COVID-19 Labs  Recent Labs    06/18/19 0510 06/19/19 0356 06/20/19 0529  DDIMER 0.63*  --   --   FERRITIN 389* 368* 405*  LDH 465*  --   --   CRP 18.5* 13.6* 13.1*    Lab Results  Component Value Date   SARSCOV2NAA POSITIVE (A) 06/17/2019   SARSCOV2NAA NOT DETECTED 06/05/2019   [place] NEGATIVE 01/23/2019    3-AKI on chronic kidney disease a stage III AB: Patient creatinine baseline 1.2.  Peak of 1.6 improved with IV fluids. Creatinine today 1.3.  4-Diabetes type 2: Poorly controlled Hemoglobin A1c 11.9 Continue with Lantus and sliding scale insulin  5-peripheral neuropathy: Continue with amitriptyline  Orthostatic hypotension;  Will avoid further fluids due t history of HF>  Encourage oral intake.  Holding lisinopril.  Chronic systolic heart failure: Compensated Normal saline lock fluids  Hyperlipidemia: Continue with Zetia  Hypertension: Continue with Coreg.  Holding lisinopril due to AKI. Mild elevation of troponin: Suspect related to acute illness. chest pain-free Hyperkalemia; resolved with fluids.  Estimated body mass index is 30.08 kg/m as calculated from the following:   Height as of 05/27/19: 5' 4"  (1.626 m).   Weight as of this encounter: 79.5 kg.   DVT prophylaxis: on eliquis.  Code Status: full code Family Communication: care discussed with Patient.  Disposition Plan: Remain I the hospital for  treatment of Covid 19 infection, AKI, hypoxemia, and tachypnea.  Consultants:   Neurosurgery   Procedures:   none  Antimicrobials:    Subjective: He is still complaining of back pain,. Report some  improvement of LE wekaness. Report constipation.  Report dyspnea.   Objective: Vitals:   06/20/19 0400 06/20/19 0611 06/20/19 0800 06/20/19 1350  BP: (!) 106/55 (!) 106/55 124/78 115/69  Pulse: 80  81 74  Resp: (!) 22  (!) 23 (!) 28  Temp:  98.8 F (37.1 C) 98.4 F (36.9 C) 98.2 F (36.8 C)  TempSrc:  Oral Oral Oral  SpO2: 92%  95% 97%  Weight:  79.5 kg      Intake/Output Summary (Last 24 hours) at 06/20/2019 1403 Last data filed at 06/20/2019 0530 Gross per 24 hour  Intake 500 ml  Output 875 ml  Net -375 ml   Filed Weights   06/20/19 0611  Weight: 79.5 kg    Examination:  General exam: NAD Respiratory system: decreased breath sounds Cardiovascular system: S 1, S 2 RRR Gastrointestinal system: BS present, soft, nt Central nervous system: alert.  Extremities: right LE 4/5 left 5/5 Skin: no rashes    Data Reviewed: I have personally reviewed following labs and imaging studies  CBC: Recent Labs  Lab 06/17/19 1240 06/17/19 1643 06/17/19 1650 06/18/19 0510 06/19/19 0356 06/20/19 0529  WBC 5.4  --   --  6.1 5.2 5.9  NEUTROABS 3.9  --   --   --   --   --   HGB 16.3  --  17.7* 16.4 14.9 14.6  HCT 47.6 51.0 52.0 48.7 43.5 41.7  MCV 89.1  --   --  90.0 88.4 86.5  PLT 142*  --   --  134* 133* 314*   Basic Metabolic Panel: Recent Labs  Lab 06/17/19 1240 06/17/19 1650 06/18/19 0510 06/19/19 0356 06/20/19 0529  NA 133* 135 134* 134* 133*  K 4.0 4.2 5.3* 4.4 4.2  CL 97* 99 100 101 101  CO2 22  --  22 20* 21*  GLUCOSE 254* 222* 176* 213* 134*  BUN 21* 26* 23* 23* 23*  CREATININE 1.65* 1.50* 1.39* 1.30* 1.26*  CALCIUM 8.6*  --  8.5* 8.2* 8.2*  MG 2.4  --  2.6* 2.5* 2.6*  PHOS 3.1  --   --   --   --    GFR: Estimated Creatinine Clearance: 60.1 mL/min (A) (by C-G formula based on SCr of 1.26 mg/dL (H)). Liver Function Tests: Recent Labs  Lab 06/17/19 1240 06/18/19 0510 06/19/19 0356 06/20/19 0529  AST 23 40 29 46*  ALT 16 23 17 29   ALKPHOS 66 61  49 54  BILITOT 0.6 1.4* 0.5 0.4  PROT 7.2 6.8 6.7 6.1*  ALBUMIN 3.4* 3.0* 2.6* 2.4*   No results for input(s): LIPASE, AMYLASE in the last 168 hours. No results for input(s): AMMONIA in the last 168 hours. Coagulation Profile: Recent Labs  Lab 06/17/19 1240  INR 1.0   Cardiac Enzymes: Recent Labs  Lab 06/17/19 1643 06/19/19 0356  CKTOTAL 224 138   BNP (last 3 results) No results for input(s): PROBNP in the last 8760 hours. HbA1C: Recent Labs    06/17/19 1643  HGBA1C 11.9*   CBG: Recent Labs  Lab 06/19/19 1144 06/19/19 1649 06/19/19 2113 06/20/19 0756 06/20/19 1200  GLUCAP 80 158* 274* 136* 153*   Lipid Profile: No results for input(s): CHOL, HDL, LDLCALC, TRIG, CHOLHDL, LDLDIRECT in the last 72 hours. Thyroid  Function Tests: Recent Labs    06/17/19 1643  TSH 2.428   Anemia Panel: Recent Labs    06/17/19 1643 06/19/19 0356 06/20/19 0529  VITAMINB12 315  --   --   FERRITIN  --  368* 405*   Sepsis Labs: Recent Labs  Lab 06/17/19 1643  PROCALCITON 0.35    Recent Results (from the past 240 hour(s))  SARS CORONAVIRUS 2 (TAT 6-24 HRS) Nasopharyngeal Nasopharyngeal Swab     Status: Abnormal   Collection Time: 06/17/19  4:20 PM   Specimen: Nasopharyngeal Swab  Result Value Ref Range Status   SARS Coronavirus 2 POSITIVE (A) NEGATIVE Final    Comment: RESULT CALLED TO, READ BACK BY AND VERIFIED WITH: RN MONIQUE DAVIS AT [facility] ON 06/17/2019 (NOTE) SARS-CoV-2 target nucleic acids are DETECTED. The SARS-CoV-2 RNA is generally detectable in upper and lower respiratory specimens during the acute phase of infection. Positive results are indicative of the presence of SARS-CoV-2 RNA. Clinical correlation with patient history and other diagnostic information is  necessary to determine patient infection status. Positive results do not rule out bacterial infection or co-infection with other viruses.  The expected result is Negative. Fact  Sheet for Patients: SugarRoll.be Fact Sheet for Healthcare Providers: https://www.woods-mathews.com/ This test is not yet approved or cleared by the [place] FDA and  has been authorized for detection and/or diagnosis of SARS-CoV-2 by FDA under an Emergency Use Authorization (EUA). This EUA will remain  in effect (meaning this t est can be used) for the duration of the COVID-19 declaration under Section 564(b)(1) of the Act, 21 U.S.C. section 360bbb-3(b)(1), unless the authorization is terminated or revoked sooner. Performed at [facility], [place] [address] [place] [address] [place], [place] [address]          Radiology Studies: MR THORACIC SPINE WO CONTRAST  Result Date: 06/20/2019 CLINICAL DATA:  Initial evaluation for mid back pain. EXAM: MRI THORACIC SPINE WITHOUT CONTRAST TECHNIQUE: Multiplanar, multisequence MR imaging of the thoracic spine was performed. No intravenous contrast was administered. COMPARISON:  None available. FINDINGS: Alignment: Vertebral bodies normally aligned with preservation of the normal thoracic kyphosis. No listhesis or subluxation. Vertebrae: Vertebral body height maintained without evidence for acute or chronic fracture. Bone marrow signal intensity within normal limits. Small benign hemangioma noted within the T9 vertebral body. No worrisome osseous lesions. No abnormal marrow edema. Cord: Signal intensity within the thoracic spinal cord is within normal limits. Paraspinal and other soft tissues: Paraspinal soft tissues demonstrate no acute finding. Patchy multifocal opacities noted within the partially visualized lungs, likely related history of COVID infection. Small T2 hyperintense simple right renal cyst noted. Visualized visceral structures otherwise unremarkable. Disc levels: T1-2:  Unremarkable. T2-3: Unremarkable. T3-4: Tiny left paracentral disc protrusion minimally indents the left ventral thecal sac. No significant  stenosis or cord deformity. T4-5:  Mild disc bulge. No significant canal or foraminal stenosis. T5-6: Small central disc protrusion minimally indents the ventral thecal sac. No significant stenosis or cord deformity. T6-7: Central/right paracentral disc protrusion indents the ventral thecal sac. Mild flattening of the ventral spinal cord without cord signal changes. No significant stenosis. T7-8: Central disc protrusion indents the ventral thecal sac, contacting and mildly flattening the ventral thoracic cord. Superimposed prominence of the dorsal epidural fat. No significant spinal stenosis. Foramina remain patent. T8-9: Small central disc protrusion indents the ventral thecal sac. Associated annular fissure. Minimal flattening of the ventral cord without cord signal changes. No significant stenosis. T9-10: Mild disc bulge  with disc desiccation. Mild posterior element hypertrophy. No significant stenosis. T10-11:  Unremarkable. T11-12:  Unremarkable. T12-L1:  Unremarkable. IMPRESSION: 1. Multifocal disc protrusions involving the T3-4 through T9-10 interspaces as above, most prominent of which is positioned at the T7-8 level. No significant spinal stenosis. Findings could contribute to underlying back pain. 2. Patchy multifocal opacities within the partially visualized lungs, likely related to history of COVID infection. Electronically Signed   By: Jeannine Boga M.D.   On: 06/20/2019 00:04   MR LUMBAR SPINE WO CONTRAST  Result Date: 06/18/2019 CLINICAL DATA:  Initial evaluation for unilateral right lower extremity weakness, tingling in toes. EXAM: MRI LUMBAR SPINE WITHOUT CONTRAST TECHNIQUE: Multiplanar, multisequence MR imaging of the lumbar spine was performed. No intravenous contrast was administered. COMPARISON:  Prior radiograph from 01/18/2012. FINDINGS: Segmentation: Standard. Lowest well-formed disc space labeled the L5-S1 level. Alignment: Physiologic with preservation of the normal lumbar  lordosis. No listhesis. Vertebrae: Vertebral body height maintained without evidence for acute or chronic fracture. Bone marrow signal intensity somewhat diffusely decreased on T1 weighted imaging, nonspecific, but most commonly related to anemia, smoking, or obesity. Subcentimeter benign hemangioma noted within the L4 vertebral body. No other discrete or worrisome osseous lesions. No abnormal marrow edema. Conus medullaris and cauda equina: Conus extends to the L1 level. Conus and cauda equina appear normal. Paraspinal and other soft tissues: Paraspinous soft tissues within normal limits. Partially visualized urinary bladder moderately distended. Visualized visceral structures otherwise unremarkable. Disc levels: L1-2: Disc desiccation without significant disc bulge. No canal or neural foraminal stenosis. No impingement. L2-3: Mild disc desiccation without significant disc bulge. No canal or neural foraminal stenosis. L3-4: Disc desiccation with minimal annular disc bulge. No significant canal or neural foraminal stenosis. No impingement. L4-5: Disc desiccation with minimal annular disc bulge. Superimposed small left foraminal disc protrusion closely approximates the exiting left L4 nerve root without frank neural impingement (series 4, image 11). No significant canal or lateral recess stenosis. Mild left greater than right L4 foraminal stenosis. L5-S1: Negative interspace. Mild epidural lipomatosis. No canal or lateral recess stenosis. Foramina remain patent. No impingement. IMPRESSION: 1. Small left foraminal disc protrusion at L4-5, closely approximating and potentially irritating the exiting left L4 nerve root. 2. Mild left greater than right L4 foraminal stenosis related to disc bulge and facet disease. 3. Additional minor for age degenerative disc disease elsewhere within the lumbar spine without significant stenosis or neural impingement. No other findings to explain patient's right lower extremity symptoms  identified. 4. Diffusely decreased T1 marrow signal intensity, nonspecific, but most commonly related to anemia, smoking, or obesity. Correlation with history and laboratory values recommended. Electronically Signed   By: Jeannine Boga M.D.   On: 06/18/2019 21:06   DG Chest Port 1 View  Result Date: 06/19/2019 CLINICAL DATA:  Cough. Right-sided weakness. EXAM: PORTABLE CHEST 1 VIEW COMPARISON:  06/17/2019 FINDINGS: There is a new focal area of atelectasis or infiltrate at the left lung base posterior medially. Minimal atelectasis at the right lung base. Lungs are otherwise clear. No effusions. Heart size and pulmonary vascularity are normal. No bone abnormality. IMPRESSION: 1. New focal area of atelectasis or infiltrate at the left lung base posterior medially. 2. Minimal new atelectasis at the right lung base. Electronically Signed   By: Lorriane Shire M.D.   On: 06/19/2019 16:46        Scheduled Meds: . amitriptyline  20 mg Oral QHS  . apixaban  5 mg Oral BID  . carvedilol  6.25  mg Oral BID WC  . dexamethasone  6 mg Oral Daily  . docusate sodium  100 mg Oral BID  . ezetimibe  10 mg Oral Daily  . insulin aspart  0-15 Units Subcutaneous TID WC  . insulin aspart  0-5 Units Subcutaneous QHS  . insulin aspart  3 Units Subcutaneous TID WC  . insulin glargine  20 Units Subcutaneous QHS  . polyethylene glycol  17 g Oral Daily  . senna-docusate  2 tablet Oral QHS   Continuous Infusions: . remdesivir 100 mg in NS 100 mL 100 mg (06/20/19 0846)     LOS: 1 day    Time spent: 35 minutes.     Elmarie Shiley, MD [facility]   If 7PM-7AM, please contact night-coverage [email or web address] Password [email or web address] 06/20/2019, 2:03 PM

## 2019-06-20 NOTE — Progress Notes (Signed)
Physical Therapy Treatment Patient Details Name: Calvin [name] MRN: [id number] DOB: 04-27-1961 Today's Date: 06/20/2019    History of Present Illness 58 y.o. male with medical history significant of combined systolic and diastolic CHF EF 41%, HTN, DM2-uncontrolled insulin-dependent, GERD, A. fib on Eliquis, obstructive sleep apnea, HLD came to the hospital with complaints of right-sided weakness of upper and lower extremity. CT negative.    PT Comments    Pt admitted with above diagnosis. Pt was able to get OOB with PT with min assist with dizziness due to orthostasis as well as he has some vertigo that was preexisting.  Pt was able to tolerate getting OOB today which was an improvement.  Will progress pt as able.  Pt currently with functional limitations due to the deficits listed below (see PT Problem List). Pt will benefit from skilled PT to increase their independence and safety with mobility to allow discharge to the venue listed below.    Orthostatic BPs  Supine 103/67, 76 bpm  Sitting 100/60, 76 bpm  Standing Would not register  Standing after3 min Would not register   After pt transferred to chair with feet down, BP 82/56 with 75 bpm;  After pt positioned in chair with feet reclined, BP 96/59 with 74 bpm.  O 2 sat 92% on 2L at rest.  Dropped to 90% once  In chair but after a few min 94% sitting at rest inchair. Pt happy to be up  Follow Up Recommendations  Home health PT;Supervision for mobility/OOB     Equipment Recommendations  None recommended by PT    Recommendations for Other Services       Precautions / Restrictions Precautions Precautions: Fall;Other (comment) Precaution Comments: watch vitals Restrictions Weight Bearing Restrictions: No    Mobility  Bed Mobility Overal bed mobility: Needs Assistance Bed Mobility: Supine to Sit;Sit to Supine     Supine to sit: Min guard Sit to supine: Min guard   General bed mobility comments: Pt wrapped up in  lines and had to get him untangled prior to moving. Assist for lines and safety, increased time/effort  Dizziness with position changes with pt orthostaatic.   Transfers Overall transfer level: Needs assistance Equipment used: 2 person hand held assist Transfers: Sit to/from [facility] Sit to Stand: Min assist Stand pivot transfers: Min assist       General transfer comment: Pt able to stand and pivot with bil  UE support.  Fairly steady. Some dizziness noted but pt was orthostatic.   Ambulation/Gait                 Stairs             Wheelchair Mobility    Modified Rankin (Stroke Patients Only)       Balance Overall balance assessment: Needs assistance Sitting-balance support: Feet supported Sitting balance-Leahy Scale: Fair Sitting balance - Comments: minguard for safety and pt often relying on UE support                                    Cognition Arousal/Alertness: Awake/alert Behavior During Therapy: WFL for tasks assessed/performed Overall Cognitive Status: Within Functional Limits for tasks assessed                                        Exercises  General Comments General comments (skin integrity, edema, etc.): Reviewed x1 exercises that pt was doing PTa and pt was able to perform but made pt dizzy. Encouraged him to continue as he has a right hypofunction.       Pertinent Vitals/Pain Pain Assessment: No/denies pain    Home Living                      Prior Function            PT Goals (current goals can now be found in the care plan section) Acute Rehab PT Goals Patient Stated Goal: not stated Progress towards PT goals: Progressing toward goals    Frequency    Min 3X/week      PT Plan Current plan remains appropriate    Co-evaluation              AM-PAC PT "6 Clicks" Mobility   Outcome Measure  Help needed turning from your back to your side while in a  flat bed without using bedrails?: None Help needed moving from lying on your back to sitting on the side of a flat bed without using bedrails?: A Little Help needed moving to and from a bed to a chair (including a wheelchair)?: A Little Help needed standing up from a chair using your arms (e.g., wheelchair or bedside chair)?: A Little Help needed to walk in hospital room?: A Lot Help needed climbing 3-5 steps with a railing? : A Lot 6 Click Score: 17    End of Session   Activity Tolerance: Treatment limited secondary to medical complications (Comment)(orthostatic) Patient left: with call bell/phone within reach;in chair Nurse Communication: Mobility status PT Visit Diagnosis: Other abnormalities of gait and mobility (R26.89);History of falling (Z91.81)     Time: 4680-3212 PT Time Calculation (min) (ACUTE ONLY): 24 min  Charges:  $Therapeutic Exercise: 8-22 mins $Therapeutic Activity: 8-22 mins                     Ansen Sayegh W,PT Acute Rehabilitation Services Pager:  [phone]  Office:  [place] 06/20/2019, 1:38 PM

## 2019-06-21 ENCOUNTER — Inpatient Hospital Stay (HOSPITAL_COMMUNITY): Payer: Medicare HMO

## 2019-06-21 DIAGNOSIS — R531 Weakness: Secondary | ICD-10-CM

## 2019-06-21 LAB — BASIC METABOLIC PANEL
Anion gap: 16 — ABNORMAL HIGH (ref 5–15)
Anion gap: 12 (ref 5–15)
BUN: 27 mg/dL — ABNORMAL HIGH (ref 6–20)
BUN: 26 mg/dL — ABNORMAL HIGH (ref 6–20)
CO2: 17 mmol/L — ABNORMAL LOW (ref 22–32)
CO2: 23 mmol/L (ref 22–32)
Calcium: 8.6 mg/dL — ABNORMAL LOW (ref 8.9–10.3)
Calcium: 8.6 mg/dL — ABNORMAL LOW (ref 8.9–10.3)
Chloride: 103 mmol/L (ref 98–111)
Chloride: 101 mmol/L (ref 98–111)
Creatinine, Ser: 1.16 mg/dL (ref 0.61–1.24)
Creatinine, Ser: 1.32 mg/dL — ABNORMAL HIGH (ref 0.61–1.24)
GFR calc Af Amer: >60 mL/min (ref >60)
GFR calc Af Amer: >60 mL/min (ref >60)
GFR calc non Af Amer: >60 mL/min (ref >60)
GFR calc non Af Amer: 59 mL/min — ABNORMAL LOW (ref >60)
Glucose, Bld: 119 mg/dL — ABNORMAL HIGH (ref 70–99)
Glucose, Bld: 198 mg/dL — ABNORMAL HIGH (ref 70–99)
Potassium: 5.2 mmol/L — ABNORMAL HIGH (ref 3.5–5.1)
Potassium: 4.4 mmol/L (ref 3.5–5.1)
Sodium: 136 mmol/L (ref 135–145)
Sodium: 136 mmol/L (ref 135–145)

## 2019-06-21 LAB — TROPONIN I (HIGH SENSITIVITY)
Troponin I (High Sensitivity): 11 ng/L (ref <18)
Troponin I (High Sensitivity): 10 ng/L (ref <18)

## 2019-06-21 LAB — CBC
HCT: 47 % (ref 39.0–52.0)
Hemoglobin: 16 g/dL (ref 13.0–17.0)
MCH: 30.5 pg (ref 26.0–34.0)
MCHC: 34 g/dL (ref 30.0–36.0)
MCV: 89.5 fL (ref 80.0–100.0)
Platelets: 172 10*3/uL (ref 150–400)
RBC: 5.25 MIL/uL (ref 4.22–5.81)
RDW: 11.9 % (ref 11.5–15.5)
WBC: 5.3 10*3/uL (ref 4.0–10.5)
nRBC: 0 % (ref 0.0–0.2)

## 2019-06-21 LAB — MAGNESIUM: Magnesium: 2.7 mg/dL — ABNORMAL HIGH (ref 1.7–2.4)

## 2019-06-21 LAB — GLUCOSE, CAPILLARY
Glucose-Capillary: 113 mg/dL — ABNORMAL HIGH (ref 70–99)
Glucose-Capillary: 172 mg/dL — ABNORMAL HIGH (ref 70–99)
Glucose-Capillary: 128 mg/dL — ABNORMAL HIGH (ref 70–99)
Glucose-Capillary: 166 mg/dL — ABNORMAL HIGH (ref 70–99)

## 2019-06-21 LAB — C-REACTIVE PROTEIN: CRP: 11.3 mg/dL — ABNORMAL HIGH (ref <1.0)

## 2019-06-21 LAB — D-DIMER, QUANTITATIVE: D-Dimer, Quant: 0.55 ug/mL-FEU — ABNORMAL HIGH (ref 0.00–0.50)

## 2019-06-21 LAB — BRAIN NATRIURETIC PEPTIDE: B Natriuretic Peptide: 112.9 pg/mL — ABNORMAL HIGH (ref 0.0–100.0)

## 2019-06-21 MED ORDER — ALUM & MAG HYDROXIDE-SIMETH 200-200-20 MG/5ML PO SUSP
30.0000 mL | Freq: Four times a day (QID) | ORAL | Status: DC | PRN
  Administered 2019-06-21: 30 mL via ORAL
  Filled 2019-06-21 (×2): qty 30

## 2019-06-21 MED ORDER — PANTOPRAZOLE SODIUM 40 MG IV SOLR
40.0000 mg | Freq: Two times a day (BID) | INTRAVENOUS | Status: DC
  Administered 2019-06-21 – 2019-06-24 (×8): 40 mg via INTRAVENOUS
  Filled 2019-06-21 (×8): qty 40

## 2019-06-21 MED ORDER — BISACODYL 10 MG RE SUPP
10.0000 mg | Freq: Once | RECTAL | Status: AC
  Administered 2019-06-21: 10 mg via RECTAL
  Filled 2019-06-21: qty 1

## 2019-06-21 MED ORDER — ASPIRIN EC 81 MG PO TBEC
81.0000 mg | DELAYED_RELEASE_TABLET | Freq: Every day | ORAL | Status: DC
  Administered 2019-06-21 – 2019-07-01 (×11): 81 mg via ORAL
  Filled 2019-06-21 (×11): qty 1

## 2019-06-21 MED ORDER — ALBUTEROL SULFATE HFA 108 (90 BASE) MCG/ACT IN AERS
1.0000 | INHALATION_SPRAY | Freq: Four times a day (QID) | RESPIRATORY_TRACT | Status: DC
  Administered 2019-06-21 – 2019-06-24 (×13): 2 via RESPIRATORY_TRACT
  Filled 2019-06-21: qty 6.7

## 2019-06-21 MED ORDER — MORPHINE SULFATE (PF) 2 MG/ML IV SOLN: 1.0000 mg | INTRAVENOUS | Status: DC | PRN

## 2019-06-21 MED ORDER — NITROGLYCERIN 0.4 MG SL SUBL
0.4000 mg | SUBLINGUAL_TABLET | SUBLINGUAL | Status: DC | PRN
  Administered 2019-06-21: 0.4 mg via SUBLINGUAL
  Filled 2019-06-21: qty 1

## 2019-06-21 MED ORDER — ATORVASTATIN CALCIUM 40 MG PO TABS
40.0000 mg | ORAL_TABLET | Freq: Every day | ORAL | Status: DC
  Administered 2019-06-21 – 2019-06-30 (×10): 40 mg via ORAL
  Filled 2019-06-21 (×11): qty 1

## 2019-06-21 MED ORDER — FUROSEMIDE 10 MG/ML IJ SOLN
20.0000 mg | Freq: Once | INTRAMUSCULAR | Status: AC
  Administered 2019-06-21: 20 mg via INTRAVENOUS
  Filled 2019-06-21: qty 2

## 2019-06-21 MED ORDER — MORPHINE SULFATE (PF) 2 MG/ML IV SOLN
1.0000 mg | INTRAVENOUS | Status: DC | PRN
  Administered 2019-06-26: 1 mg via INTRAVENOUS
  Filled 2019-06-21: qty 1

## 2019-06-21 MED ORDER — DEXAMETHASONE SODIUM PHOSPHATE 10 MG/ML IJ SOLN
6.0000 mg | INTRAMUSCULAR | Status: DC
  Administered 2019-06-22: 6 mg via INTRAVENOUS
  Filled 2019-06-21: qty 1

## 2019-06-21 NOTE — Progress Notes (Signed)
Occupational Therapy Treatment Patient Details Name: Calvin [name] MRN: [id number] DOB: January 20, 1961 Today's Date: 06/21/2019    History of present illness 58 y.o. 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 fib on Eliquis, obstructive sleep apnea, HLD came to the hospital with complaints of right-sided weakness of upper and lower extremity. CT negative.   OT comments  Pt making gradual progress towards OT goals this session. Pt reports being wet from missing urinal upon OT arrival. Pt agreeable to sit EOB to get cleaned up. Pt completed bed mobility with supervision. Pt able to sit EOB ~ 1.5 minutes before needing to lay back down. Pt reports general fatigue and discomfort found to have drop in BP ( see vitals below). Pt reports feeling dizzy like the objects on the table in front of him are "falling away."Assisted pt with LB bathing from supine with MAX A and UB dressing from supine with MIN A. Pt limited by generalized weakness, pain and hypotension. DC plan currently remains appropriate, but will continue to follow acutely per POC and update DC recs as needed.    Prior to session- 130/66, HR- 70, O2 100% on 4.5 L  EOB- 102/71, HR- 76, O2 100% on 4.5 L Returned to supine- 135/76, HR- 1mO2 100% on 4.5 L    Follow Up Recommendations  Home health OT;Supervision/Assistance - 24 hour(HH vs SNF, pending progress)    Equipment Recommendations  3 in 1 bedside commode;Other (comment)(to be further assessed)    Recommendations for Other Services      Precautions / Restrictions Precautions Precautions: Fall;Other (comment) Precaution Comments: watch vitals Restrictions Weight Bearing Restrictions: No       Mobility Bed Mobility Overal bed mobility: Needs Assistance Bed Mobility: Rolling Rolling: Supervision   Supine to sit: Supervision;HOB elevated Sit to supine: Supervision;HOB  elevated   General bed mobility comments: pt agreeable to sit EOB and completed transition with supervision. conpleted transfer in similar faship abruptly d/t pain, weakness and lightheadness  Transfers                 General transfer comment: unable to attempt this session    Balance Overall balance assessment: Needs assistance   Sitting balance-Leahy Scale: Fair                                     ADL either performed or assessed with clinical judgement   ADL Overall ADL's : Needs assistance/impaired             Lower Body Bathing: Minimal assistance;Bed level Lower Body Bathing Details (indicate cue type and reason): attempted sitting LB bathing with pt limited by faitgue and chest pain requiring MAX A from bed level Upper Body Dressing : Bed level;Minimal assistance         Toilet Transfer Details (indicate cue type and reason): unable d/t hypotension           General ADL Comments: pt only able to tolerate sitting EOB 1.5 minute this session. limited due to fatigue, hypotension and chest pain. pt required MAX A for LB bathing from bed level and MIN A for UB dressing from bed level     Vision Baseline Vision/History: Wears glasses Wears Glasses: Reading only Patient Visual Report: No change from baseline Additional Comments: pt reports feeling like items in front of him are "falling away" unable to  assess d/t general weakness. will continue to monitor   Perception     Praxis      Cognition Arousal/Alertness: Awake/alert Behavior During Therapy: [name] for tasks assessed/performed Overall Cognitive Status: Within Functional Limits for tasks assessed                                          Exercises     Shoulder Instructions       General Comments brought in level 1 theraband but unable to teach therex. Pt slightly hypotenaive during session reporting that once he was sitting up it seemed like the objects on the  table were "falling away" EOB BP- 102/71 HR 76 O2 100, BP supine after sitting EOB- 135/79 HR 80 O2 100. Pt on 4.5 L O2 via [place] with sats WNL    Pertinent Vitals/ Pain       Pain Assessment: Faces Faces Pain Scale: Hurts little more Pain Location: chest Pain Descriptors / Indicators: Aching;Grimacing;Discomfort Pain Intervention(s): Limited activity within patient's tolerance;Monitored during session;Repositioned  Home Living                                          Prior Functioning/Environment              Frequency  Min 2X/week        Progress Toward Goals  OT Goals(current goals can now be found in the care plan section)  Progress towards OT goals: Progressing toward goals  Acute Rehab OT Goals Patient Stated Goal: not stated OT Goal Formulation: With patient Time For Goal Achievement: 07/03/19 Potential to Achieve Goals: Good  Plan Discharge plan remains appropriate    Co-evaluation                 AM-PAC OT "6 Clicks" Daily Activity     Outcome Measure   Help from another person eating meals?: None Help from another person taking care of personal grooming?: A Little Help from another person toileting, which includes using toliet, bedpan, or urinal?: Total Help from another person bathing (including washing, rinsing, drying)?: A Lot Help from another person to put on and taking off regular upper body clothing?: A Lot Help from another person to put on and taking off regular lower body clothing?: Total 6 Click Score: 13    End of Session Equipment Utilized During Treatment: Oxygen;Other (comment)(4.5 L O2 via [place])  OT Visit Diagnosis: Muscle weakness (generalized) (M62.81);Other symptoms and signs involving the nervous system (R29.898)   Activity Tolerance Patient limited by fatigue;Other (comment)(limited due to lightheadedness)   Patient Left in bed;with call bell/phone within reach;with bed alarm set   Nurse Communication  Mobility status;Other (comment)(hyptotensive)        Time: 4098-1191 OT Time Calculation (min): 25 min  Charges: OT General Charges $OT Visit: 1 Visit OT Treatments $Self Care/Home Management : 23-37 mins  Lanier Clam., COTA/L Acute Rehabilitation Services [phone]    Ihor Gully 06/21/2019, 3:27 PM

## 2019-06-21 NOTE — Progress Notes (Signed)
   Vital Signs MEWS/VS Documentation      06/21/2019 0800 06/21/2019 1100 06/21/2019 1200 06/21/2019 1525   MEWS Score:  2  --  2  --   MEWS Score Color:  Yellow  --  Yellow  --   Resp:  (!) 26  --  --  --   Pulse:  79  --  74  --   BP:  137/86  --  130/66  --   Temp:  97.8 F (36.6 C)  --  --  --   O2 Device:  --  Nasal Cannula  --  Nasal Cannula   O2 Flow Rate (L/min):  --  4 L/min  --  3 L/min     - Patient has been having tachypnea since admission and on O2 3L at the moment, medications has been given as per MD; MD aware of patient's condition. - no further interventions at this time.      Nocole Zammit J Randee Huston 06/21/2019,3:27 PM

## 2019-06-21 NOTE — TOC Initial Note (Signed)
Transition of Care [facility]) - Initial/Assessment Note    Patient Details  Name: Calvin [name] MRN: [id number] Date of Birth: Dec 14, 1960  Transition of Care [facility]) CM/SW Contact:    Maryclare Labrador, RN Phone Number: 06/21/2019, 4:13 PM  Clinical Narrative: PTA independent from home - pt states he stays with family.  Pt has PCP and denied barriers with paying for medications.  Pt is interested in [facility] as recommended - medicare.gov HH list given - pt chose [facility] accepted.  Awaiting HH orders                  Expected Discharge Plan: [place] Barriers to Discharge: Continued Medical Work up   Patient Goals and CMS Choice Patient states their goals for this hospitalization and ongoing recovery are:: Pt states he is ready to get back home and enjoy the holidays CMS Medicare.gov Compare Post Acute Care list provided to:: Patient Choice offered to / list presented to : Patient  Expected Discharge Plan and Services Expected Discharge Plan: [place]       Living arrangements for the past 2 months: Single Family Home                           HH Arranged: PT [place]: [place] Date [facility] Agency Contacted: 06/21/19 Time HH Agency Contacted: [phone] Representative spoke with at [place]: Tommi Rumps  Prior Living Arrangements/Services Living arrangements for the past 2 months: [place]   Patient language and need for interpreter reviewed:: Yes Do you feel safe going back to the place where you live?: Yes      Need for Family Participation in Patient Care: Yes (Comment) Care giver support system in place?: Yes (comment) Current home services: DME(walker) Criminal Activity/Legal Involvement Pertinent to Current Situation/Hospitalization: No - Comment as needed  Activities of Daily Living      Permission Sought/Granted   Permission granted to share information with : Yes, Verbal Permission Granted      Permission granted to share info w AGENCY: Alvis [facility]        Emotional Assessment   Attitude/Demeanor/Rapport: Self-Confident, Engaged Affect (typically observed): Accepting, Adaptable Orientation: : Oriented to Self, Oriented to Place, Oriented to  Time, Oriented to Situation   Psych Involvement: No (comment)  Admission diagnosis:  Right sided weakness [R53.1] Patient Active Problem List   Diagnosis Date Noted  . COVID-19 virus detected 06/18/2019  . Right sided weakness 06/17/2019  . AKI (acute kidney injury) ([place]) 06/17/2019  . Foot pain, bilateral 02/01/2019  . Abdominal pain 01/23/2019  . Chest pain 01/23/2019  . Right leg pain 12/20/2018  . Overgrown toenails 12/20/2018  . Unsteady gait 12/20/2018  . Encounter for therapeutic drug monitoring 10/22/2018  . Nausea and vomiting 03/10/2018  . AF (paroxysmal atrial fibrillation) ([place]) 03/10/2018  . Non-cardiac chest pain 03/09/2018  . Diabetic neuropathy ([place]) 09/26/2017  . MCI (mild cognitive impairment) 06/23/2017  . OSA on CPAP 05/10/2017  . Gastroesophageal reflux disease 05/10/2017  . Insomnia 05/10/2017  . Onychomycosis of multiple toenails with type 2 diabetes mellitus and peripheral neuropathy ([place]) 05/10/2017  . Chronic systolic heart failure ([place]) 11/22/2015  . Essential hypertension 11/22/2015  . DM (diabetes mellitus) ([place]) 11/20/2015  . HLD (hyperlipidemia) 11/20/2015  . Hx of adenomatous colonic polyps 05/19/2011   PCP:  [name], NP Pharmacy:   [place],  [place] [address] Phone: [phone] Fax: [phone]  [facility] [place], [address] AT [address] [place] [address] Phone: [phone] Fax: [phone]     Social Determinants of Health (SDOH) Interventions    Readmission Risk Interventions No flowsheet data found.

## 2019-06-21 NOTE — Progress Notes (Signed)
PROGRESS NOTE    Calvin [name]  HQI:[id number] DOB: 1960/08/14 DOA: 06/17/2019 PCP: [name], NP   Brief Narrative: 58 year old with past medical history significant for combined systolic and diastolic heart failure ejection fraction 35%, HTN, DM type 2, a fib on eliquis, obstructive sleep apnea, HLD who presents complaining of right-sided weakness, especially lower extremity.  He was found to have SIRS coronavirus 2 infection.  MRI of the brain was negative for stroke.  MRI of the lumbar spine:Small left foraminal disc protrusion at L4-5, closely approximating and potentially irritating the exiting left L4 nerve root. Mild left greater than right L4 foraminal stenosis related to disc bulge and facet disease.   Assessment & Plan:   Principal Problem:   Right sided weakness Active Problems:   DM (diabetes mellitus) (HCC)   HLD (hyperlipidemia)   Chronic systolic heart failure (HCC)   Essential hypertension   OSA on CPAP   Onychomycosis of multiple toenails with type 2 diabetes mellitus and peripheral neuropathy (HCC)   MCI (mild cognitive impairment)   Diabetic neuropathy (HCC)   AF (paroxysmal atrial fibrillation) (HCC)   AKI (acute kidney injury) ([place])   COVID-19 virus detected  1--COVID-19 infection: Patient presented with elevated inflammatory markers, CRP at 18. He report cough, chest x ray with opacification left bases.  Continue to monitor inflammatory markers. On dexamethasone for hypoxemia.  Started on remdesivir day 3.  Worsening Hypoxemia today, during episode of chest pain. Chest x ray not significant changes. Remain stable on 5 L.  Added albuterol.  COVID-19 Labs  Recent Labs    06/19/19 0356 06/20/19 0529 06/21/19 0500  FERRITIN 368* 405*  --   CRP 13.6* 13.1* 11.3*    Lab Results  Component Value Date   SARSCOV2NAA POSITIVE (A) 06/17/2019   SARSCOV2NAA NOT DETECTED 06/05/2019   [place] NEGATIVE 01/23/2019    3-Chest pain.    Resolved after nitroglycerin.  EKG with mild st elevation only V 2. T wave inversion.  Cardiology consulted.  Added aspirin, lipitor.  He is on eliquis, D dimer not significantly elevated.   AKI on chronic kidney disease a stage III AB: Patient creatinine baseline 1.2.  Peak of 1.6 improved with IV fluids. Creatinine today 1.3.  -Diabetes type 2: Poorly controlled Hemoglobin A1c 11.9 Continue with Lantus and sliding scale insulin  -peripheral neuropathy: Continue with amitriptyline  Orthostatic hypotension;  Will avoid further fluids due t history of HF>  Encourage oral intake.  Holding lisinopril.  Chronic systolic heart failure: Compensated Will give a dose of lasix. BNP mildly elevated  Hyperlipidemia: Continue with Zetia  Right lower extremity weakness: -MRI of brain negative. -MRI of lumbar spine:Small left foraminal disc protrusion at L4-5, closely approximating and potentially irritating the exiting left L4 nerve root. Mild left greater than right L4 foraminal stenosis related to disc bulge and facet disease. Prior episode of the same. -On  steroids. -MRI thoracic spine ordered. Showed; Multifocal disc protrusions involving the T3-4 through T9-10 interspaces as above, most prominent of which is positioned at the T7-8 level. No significant spinal stenosis. Findings could contribute to underlying back pain. Discussed with PA, from neurosurgery, MRI finding does not explain weakness. Nothing that will need surgery. Patient needs to follow up with PCP.  Hypertension: Continue with Coreg.  Holding lisinopril due to AKI. Mild elevation of troponin: Suspect related to acute illness. chest pain-free Hyperkalemia; resolved with fluids.  Estimated body mass index is 30.08 kg/m as calculated from the following:  Height as of 05/27/19: 5' 4"  (1.626 m).   Weight as of this encounter: 79.5 kg.   DVT prophylaxis: on eliquis.  Code Status: full code Family Communication: care  discussed with Patient. Will update sister.  Disposition Plan: Remain I the hospital for treatment of Covid 19 infection, AKI, hypoxemia, and tachypnea.  Consultants:   Neurosurgery   Procedures:   none  Antimicrobials:    Subjective: He develops severe chest pain, tightness, oxygen drop to 85 on 2l. Complaining of dyspnea, complaints of abdominal pain   Objective: Vitals:   06/21/19 0400 06/21/19 0514 06/21/19 0757 06/21/19 0800  BP: 130/78   137/86  Pulse: 71   79  Resp: (!) 25   (!) 26  Temp:  98.3 F (36.8 C)  97.8 F (36.6 C)  TempSrc:  Oral  Oral  SpO2: 90%  94%   Weight:        Intake/Output Summary (Last 24 hours) at 06/21/2019 0936 Last data filed at 06/21/2019 0516 Gross per 24 hour  Intake 300 ml  Output 450 ml  Net -150 ml   Filed Weights   06/20/19 0611 06/21/19 0332  Weight: 79.5 kg 79.5 kg    Examination:  General exam: NAD Respiratory system: No wheezing, crackles bases Cardiovascular system: S 1, S  2 RRR Gastrointestinal system: BS present, soft, mild tender Central nervous system: Alert Extremities: right LE 4/5 left 5/5 Skin: no rashes    Data Reviewed: I have personally reviewed following labs and imaging studies  CBC: Recent Labs  Lab 06/17/19 1240 06/17/19 1650 06/18/19 0510 06/19/19 0356 06/20/19 0529 06/21/19 0500  WBC 5.4  --  6.1 5.2 5.9 5.3  NEUTROABS 3.9  --   --   --   --   --   HGB 16.3 17.7* 16.4 14.9 14.6 16.0  HCT 47.6 52.0 48.7 43.5 41.7 47.0  MCV 89.1  --  90.0 88.4 86.5 89.5  PLT 142*  --  134* 133* 146* 270   Basic Metabolic Panel: Recent Labs  Lab 06/17/19 1240 06/17/19 1650 06/18/19 0510 06/19/19 0356 06/20/19 0529 06/21/19 0500  NA 133* 135 134* 134* 133* 136  K 4.0 4.2 5.3* 4.4 4.2 5.2*  CL 97* 99 100 101 101 103  CO2 22  --  22 20* 21* 17*  GLUCOSE 254* 222* 176* 213* 134* 119*  BUN 21* 26* 23* 23* 23* 27*  CREATININE 1.65* 1.50* 1.39* 1.30* 1.26* 1.16  CALCIUM 8.6*  --  8.5* 8.2* 8.2*  8.6*  MG 2.4  --  2.6* 2.5* 2.6* 2.7*  PHOS 3.1  --   --   --   --   --    GFR: Estimated Creatinine Clearance: 65.3 mL/min (by C-G formula based on SCr of 1.16 mg/dL). Liver Function Tests: Recent Labs  Lab 06/17/19 1240 06/18/19 0510 06/19/19 0356 06/20/19 0529  AST 23 40 29 46*  ALT 16 23 17 29   ALKPHOS 66 61 49 54  BILITOT 0.6 1.4* 0.5 0.4  PROT 7.2 6.8 6.7 6.1*  ALBUMIN 3.4* 3.0* 2.6* 2.4*   No results for input(s): LIPASE, AMYLASE in the last 168 hours. No results for input(s): AMMONIA in the last 168 hours. Coagulation Profile: Recent Labs  Lab 06/17/19 1240  INR 1.0   Cardiac Enzymes: Recent Labs  Lab 06/17/19 1643 06/19/19 0356  CKTOTAL 224 138   BNP (last 3 results) No results for input(s): PROBNP in the last 8760 hours. HbA1C: No results for input(s): HGBA1C  in the last 72 hours. CBG: Recent Labs  Lab 06/20/19 0756 06/20/19 1200 06/20/19 1659 06/20/19 2122 06/21/19 0800  GLUCAP 136* 153* 189* 283* 113*   Lipid Profile: No results for input(s): CHOL, HDL, LDLCALC, TRIG, CHOLHDL, LDLDIRECT in the last 72 hours. Thyroid Function Tests: No results for input(s): TSH, T4TOTAL, FREET4, T3FREE, THYROIDAB in the last 72 hours. Anemia Panel: Recent Labs    06/19/19 0356 06/20/19 0529  FERRITIN 368* 405*   Sepsis Labs: Recent Labs  Lab 06/17/19 1643  PROCALCITON 0.35    Recent Results (from the past 240 hour(s))  SARS CORONAVIRUS 2 (TAT 6-24 HRS) Nasopharyngeal Nasopharyngeal Swab     Status: Abnormal   Collection Time: 06/17/19  4:20 PM   Specimen: Nasopharyngeal Swab  Result Value Ref Range Status   SARS Coronavirus 2 POSITIVE (A) NEGATIVE Final    Comment: RESULT CALLED TO, READ BACK BY AND VERIFIED WITH: RN MONIQUE DAVIS AT [facility] ON 06/17/2019 (NOTE) SARS-CoV-2 target nucleic acids are DETECTED. The SARS-CoV-2 RNA is generally detectable in upper and lower respiratory specimens during the acute phase of infection.  Positive results are indicative of the presence of SARS-CoV-2 RNA. Clinical correlation with patient history and other diagnostic information is  necessary to determine patient infection status. Positive results do not rule out bacterial infection or co-infection with other viruses.  The expected result is Negative. Fact Sheet for Patients: SugarRoll.be Fact Sheet for Healthcare Providers: https://www.woods-mathews.com/ This test is not yet approved or cleared by the [place] FDA and  has been authorized for detection and/or diagnosis of SARS-CoV-2 by FDA under an Emergency Use Authorization (EUA). This EUA will remain  in effect (meaning this t est can be used) for the duration of the COVID-19 declaration under Section 564(b)(1) of the Act, 21 U.S.C. section 360bbb-3(b)(1), unless the authorization is terminated or revoked sooner. Performed at [facility], [place] [address] [place],  [address]          Radiology Studies: MR THORACIC SPINE WO CONTRAST  Result Date: 06/20/2019 CLINICAL DATA:  Initial evaluation for mid back pain. EXAM: MRI THORACIC SPINE WITHOUT CONTRAST TECHNIQUE: Multiplanar, multisequence MR imaging of the thoracic spine was performed. No intravenous contrast was administered. COMPARISON:  None available. FINDINGS: Alignment: Vertebral bodies normally aligned with preservation of the normal thoracic kyphosis. No listhesis or subluxation. Vertebrae: Vertebral body height maintained without evidence for acute or chronic fracture. Bone marrow signal intensity within normal limits. Small benign hemangioma noted within the T9 vertebral body. No worrisome osseous lesions. No abnormal marrow edema. Cord: Signal intensity within the thoracic spinal cord is within normal limits. Paraspinal and other soft tissues: Paraspinal soft tissues demonstrate no acute finding. Patchy multifocal opacities noted within the partially  visualized lungs, likely related history of COVID infection. Small T2 hyperintense simple right renal cyst noted. Visualized visceral structures otherwise unremarkable. Disc levels: T1-2:  Unremarkable. T2-3: Unremarkable. T3-4: Tiny left paracentral disc protrusion minimally indents the left ventral thecal sac. No significant stenosis or cord deformity. T4-5:  Mild disc bulge. No significant canal or foraminal stenosis. T5-6: Small central disc protrusion minimally indents the ventral thecal sac. No significant stenosis or cord deformity. T6-7: Central/right paracentral disc protrusion indents the ventral thecal sac. Mild flattening of the ventral spinal cord without cord signal changes. No significant stenosis. T7-8: Central disc protrusion indents the ventral thecal sac, contacting and mildly flattening the ventral thoracic cord. Superimposed prominence of the dorsal epidural fat. No significant  spinal stenosis. Foramina remain patent. T8-9: Small central disc protrusion indents the ventral thecal sac. Associated annular fissure. Minimal flattening of the ventral cord without cord signal changes. No significant stenosis. T9-10: Mild disc bulge with disc desiccation. Mild posterior element hypertrophy. No significant stenosis. T10-11:  Unremarkable. T11-12:  Unremarkable. T12-L1:  Unremarkable. IMPRESSION: 1. Multifocal disc protrusions involving the T3-4 through T9-10 interspaces as above, most prominent of which is positioned at the T7-8 level. No significant spinal stenosis. Findings could contribute to underlying back pain. 2. Patchy multifocal opacities within the partially visualized lungs, likely related to history of COVID infection. Electronically Signed   By: Jeannine Boga M.D.   On: 06/20/2019 00:04   DG Chest Port 1 View  Result Date: 06/19/2019 CLINICAL DATA:  Cough. Right-sided weakness. EXAM: PORTABLE CHEST 1 VIEW COMPARISON:  06/17/2019 FINDINGS: There is a new focal area of atelectasis  or infiltrate at the left lung base posterior medially. Minimal atelectasis at the right lung base. Lungs are otherwise clear. No effusions. Heart size and pulmonary vascularity are normal. No bone abnormality. IMPRESSION: 1. New focal area of atelectasis or infiltrate at the left lung base posterior medially. 2. Minimal new atelectasis at the right lung base. Electronically Signed   By: Lorriane Shire M.D.   On: 06/19/2019 16:46        Scheduled Meds: . albuterol  1-2 puff Inhalation Q6H  . amitriptyline  20 mg Oral QHS  . apixaban  5 mg Oral BID  . carvedilol  6.25 mg Oral BID WC  . dexamethasone  6 mg Oral Daily  . docusate sodium  100 mg Oral BID  . ezetimibe  10 mg Oral Daily  . insulin aspart  0-15 Units Subcutaneous TID WC  . insulin aspart  0-5 Units Subcutaneous QHS  . insulin aspart  3 Units Subcutaneous TID WC  . insulin glargine  20 Units Subcutaneous QHS  . pantoprazole (PROTONIX) IV  40 mg Intravenous Q12H  . polyethylene glycol  17 g Oral Daily  . senna-docusate  2 tablet Oral QHS   Continuous Infusions: . remdesivir 100 mg in NS 100 mL 100 mg (06/21/19 0858)     LOS: 2 days    Time spent: 35 minutes.     Elmarie Shiley, MD [facility]   If 7PM-7AM, please contact night-coverage [email or web address] Password [facility] 06/21/2019, 9:36 AM

## 2019-06-21 NOTE — Consult Note (Addendum)
Cardiology Consultation:   Patient ID: Aviel Davalos MRN: [id number]; DOB: Oct 25, 1960  Admit date: 06/17/2019 Date of Consult: 06/21/2019  Primary Care Provider: [name], NP Primary Cardiologist: [name], MD  Primary Electrophysiologist:  None    Patient Profile:   Dontrail Blackwell is a 58 y.o. 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 Tyrell Antonio.  History of Present Illness:   Mr. Kearley is followed by Dr. Angelena Form. Patient reports an MI in 76 when living in [place]. 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 He was readmitted in 10/2017 with chest pain. CTA was negative for PE. He was admitted again 03/2018 for chest pain found to be in paroxysmal afib and was started on Eliquis. Carvedilol was increased.   The patient also follows with the [facility] clinic. In 2019 he was seen and felt to be stable at that time. He was started on Entresto but this was stopped due to dizziness. He was referred for a sleep study which came back normal. Arlyce Harman was prescribed but he did not pick it up for financial reasons. He remained on carvedilol and lisinopril. Reports chronic orthopnea. He is not on diuretics at baseline.  The patient was admitted in July 2020 for chest pain. Troponin was mildly elevated and he had chest wall tenderness. Echo showed EF 35-40%, mild LVH, RV with normal systolic function. No further ischemic evaluation was performed at that time.   The patient was admitted on 06/17/19 for right-sided weakness, dizziness, and shortness of breath. He said he was having chest pain as well at that time. Said he was compliant with Eliquis. CT head was negative. MRI of the brain  was also negative for stroke.  He had mild AKI and elevated inflammatory markers. COVID was positive. CXR showed atelectasis or infiltrate at the left lung base. Patient was admitted for further work-up.  MRI lumbar spine showed disc protrusion of the lumbar and neurosurgery was consulted. Patient complained of chest pain and cardiology was consulted.   I called into the patient room due to COVID positive status. He said he was not having active chest pain but had an episode about 30 minutes ago. On my interview the patient says he has intermittent chest pain. It starts on the right and radiates to the left. It lasts for about 10 minutes before resolving. Pain is reproducible when he presses on his chest. Nothing seems to make the pain better. He says this pain is similar to what he felt in July 2020. Denies tobacco/alcohol/drug use.   Potassium 4.4 Glucose 198 Creatinine 1.32 WBC 5.3 BNP 112 HS troponin 11 D-dimer 0.55  CXR today: stable hazy opacification over the left/base region  Heart Pathway Score:  HEAR Score: 4  Past Medical History:  Diagnosis Date  . Adenoidal hypertrophy   . Adenomatous colon polyps 2012  . Arthritis   . Diabetes mellitus   . GERD (gastroesophageal reflux disease)   . Heart attack ([place])    While living in [place]  [name] Hx of adenomatous colonic polyps 08/18/2017  . Hyperlipidemia   . Hypertension   . Mild CAD    a. cath in 08/2017 showing mild nonobstructive CAD with scattered 20-30% stenosis.   [name]  Nonischemic cardiomyopathy ([place])    a. EF 20-25% by echo in 09/2017 with cath showing mild CAD. b.  Last echo 12/2017 EF 35-40%, grade 2 DD.  [name] Obesity   . PAF (paroxysmal atrial fibrillation) ([place])   . Sleep apnea    cpap, pt says no longer has    Past Surgical History:  Procedure Laterality Date  . COLONOSCOPY  05/13/11   9 adenomas  . FOREARM SURGERY    . MUSCLE BIOPSY    . RIGHT/LEFT HEART CATH AND CORONARY ANGIOGRAPHY N/A 08/25/2017   Procedure: RIGHT/LEFT  HEART CATH AND CORONARY ANGIOGRAPHY;  Surgeon: [name], MD;  Location: [facility];  Service: Cardiovascular;  Laterality: N/A;     Home Medications:  Prior to Admission medications   Medication Sig Start Date End Date Taking? Authorizing Provider  amitriptyline (ELAVIL) 10 MG tablet Take 1 tablet at bed time for one week and then increase 2 tablets at bed time Patient taking differently: Take 20 mg by mouth at bedtime.  05/14/19  Yes Cameron Sprang, MD  apixaban (ELIQUIS) 5 MG TABS tablet Take 5 mg by mouth 2 (two) times daily.   Yes [provider]  carvedilol (COREG) 6.25 MG tablet Take 1 tablet (6.25 mg total) by mouth 2 (two) times daily with a meal. 09/27/18  Yes Larey Dresser, MD  donepezil (ARICEPT) 10 MG tablet Take 1 tablet (10 mg total) by mouth at bedtime. 09/18/18  Yes Cameron Sprang, MD  Evolocumab (REPATHA) 140 MG/ML SOSY Inject 140 mg into the skin every 14 (fourteen) days.    Yes [provider]  ezetimibe (ZETIA) 10 MG tablet Take 1 tablet (10 mg total) by mouth daily. 04/11/19  Yes [name], MD  glucose blood (CONTOUR TEST) test strip 1 each by Other route 2 (two) times a day. 02/01/19  Yes [name], MD  Insulin Degludec (TRESIBA FLEXTOUCH) 200 UNIT/ML SOPN Inject 220 Units into the skin daily. And pen needles 2/day 06/13/19  Yes [name], MD  Insulin Syringe-Needle U-100 (INSULIN SYRINGE .3CC/29GX1/2") 29G X 1/2" 0.3 ML MISC Inject 1 Syringe 3 (three) times daily as directed. Check blood sugar three times daily. Dx: E11.9 05/10/17  Yes [name], [name], DO  lisinopril (PRINIVIL,ZESTRIL) 10 MG tablet Take 1 tablet (10 mg total) by mouth at bedtime. 09/27/18  Yes Larey Dresser, MD  nitroGLYCERIN (NITROSTAT) 0.4 MG SL tablet Place 1 tablet (0.4 mg total) under the tongue every 5 (five) minutes as needed for chest pain. 10/27/17  Yes [name], DO    Inpatient Medications: Scheduled Meds: . albuterol  1-2 puff  Inhalation Q6H  . amitriptyline  20 mg Oral QHS  . apixaban  5 mg Oral BID  . aspirin EC  81 mg Oral Daily  . atorvastatin  40 mg Oral q1800  . carvedilol  6.25 mg Oral BID WC  . dexamethasone  6 mg Oral Daily  . docusate sodium  100 mg Oral BID  . ezetimibe  10 mg Oral Daily  . insulin aspart  0-15 Units Subcutaneous TID WC  . insulin aspart  0-5 Units Subcutaneous QHS  . insulin aspart  3 Units Subcutaneous TID WC  . insulin glargine  20 Units Subcutaneous QHS  . pantoprazole (PROTONIX) IV  40 mg Intravenous Q12H  . polyethylene glycol  17 g Oral Daily  . senna-docusate  2 tablet Oral QHS   Continuous Infusions: . remdesivir 100 mg in NS 100 mL  100 mg (06/21/19 0858)   PRN Meds: acetaminophen **OR** acetaminophen, alum & mag hydroxide-simeth, dextromethorphan-guaiFENesin, morphine injection, nitroGLYCERIN, ondansetron **OR** ondansetron (ZOFRAN) IV, traMADol  Allergies:   No Known Allergies  Social History:   Social History   Socioeconomic History  . Marital status: Single    Spouse name: Not on file  . Number of children: 0  . Years of education: Not on file  . Highest education level: Not on file  Occupational History  . Occupation: Disability  Tobacco Use  . Smoking status: Never Smoker  . Smokeless tobacco: Never Used  Substance and Sexual Activity  . Alcohol use: No  . Drug use: No  . Sexual activity: Yes    Birth control/protection: None  Other Topics Concern  . Not on file  Social History Narrative   Social History   Diet?    Do you drink/eat things with caffeine? yes   Marital status?       single      Do you live in a house, apartment, assisted living, condo, trailer, etc.? yes   Is it one or more stories? One story   How many persons live in your home?   Do you have any pets in your home? (please list)    Highest level of education completed? graduate   Do you exercise?            no                          Type & how often?   Advanced Directives     Do you have a living will? no   Do you have a DNR form?                                  If not, do you want to discuss one? no   Do you have signed POA/HPOA for forms? no      Functional Status   Do you have difficulty bathing or dressing yourself? no   Do you have difficulty preparing food or eating? no   Do you have difficulty managing your medications? no   Do you have difficulty managing your finances? no   Do you have difficulty affording your medications? no   Social Determinants of Health   Financial Resource Strain:   . Difficulty of Paying Living Expenses: Not on file  Food Insecurity:   . Worried About Charity fundraiser in the Last Year: Not on file  . Ran Out of Food in the Last Year: Not on file  Transportation Needs: No Transportation Needs  . Lack of Transportation (Medical): No  . Lack of Transportation (Non-Medical): No  Physical Activity:   . Days of Exercise per Week: Not on file  . Minutes of Exercise per Session: Not on file  Stress:   . Feeling of Stress : Not on file  Social Connections:   . Frequency of Communication with Friends and Family: Not on file  . Frequency of Social Gatherings with Friends and Family: Not on file  . Attends Religious Services: Not on file  . Active Member of Clubs or Organizations: Not on file  . Attends Archivist Meetings: Not on file  . Marital Status: Not on file  Intimate Partner Violence:   . Fear of Current or Ex-Partner: Not on file  . Emotionally Abused: Not  on file  . Physically Abused: Not on file  . Sexually Abused: Not on file    Family History:    Family History  Problem Relation Age of Onset  . Heart disease Mother   . Heart attack Mother 72  . Hypertension Mother   . Hyperlipidemia Mother   . Diabetes Mother   . Heart disease Father   . Heart attack Father 5  . Hypertension Father   . Hyperlipidemia Father   . Diabetes Father   . Heart attack Sister 77  . Colon cancer Neg Hx   .  Stomach cancer Neg Hx   . Esophageal cancer Neg Hx   . Rectal cancer Neg Hx      ROS:  Please see the history of present illness.  All other ROS reviewed and negative.     Physical Exam/Data:   Vitals:   06/21/19 0514 06/21/19 0757 06/21/19 0800 06/21/19 1200  BP:   137/86 130/66  Pulse:   79 74  Resp:   (!) 26   Temp: 98.3 F (36.8 C)  97.8 F (36.6 C)   TempSrc: Oral  Oral   SpO2:  94%    Weight:        Intake/Output Summary (Last 24 hours) at 06/21/2019 1244 Last data filed at 06/21/2019 0516 Gross per 24 hour  Intake 300 ml  Output 450 ml  Net -150 ml   Last 3 Weights 06/21/2019 06/20/2019 06/19/2019  Weight (lbs) 175 lb 4.3 oz 175 lb 4.3 oz 175 lb  Weight (kg) 79.5 kg 79.5 kg 79.379 kg     Body mass index is 30.08 kg/m.   Physical Exam not performed due to COVID positive status  EKG:  The EKG was personally reviewed and demonstrates:  NSR, 79 bpm, TWI V4-V6 (old, minimal ST elevation V2 Telemetry:  Telemetry was personally reviewed and demonstrates:  N/A  Relevant CV Studies:   Echo 01/25/19 1. The left ventricle has moderately reduced systolic function, with an ejection fraction of 35-40%. The cavity size was mildly dilated. There is mild concentric left ventricular hypertrophy. Left ventricular diastolic Doppler parameters are consistent  with pseudonormalization. Elevated left ventricular end-diastolic pressure.  2. The right ventricle has normal systolic function. The cavity was normal. There is no increase in right ventricular wall thickness.  3. Left atrial size was mildly dilated.  4. Mild thickening of the aortic valve. Mild calcification of the aortic valve. Aortic valve regurgitation is trivial by color flow Doppler. No stenosis of the aortic valve.  5. The aorta is normal in size and structure.    LHC/RHC 08/2017  RA 5  PA 22/11 (15)  PCWP 9  CO 6 CI 3/14   Prox RCA to Mid RCA lesion is 30% stenosed.  Mid RCA to Dist RCA lesion is 30%  stenosed.  Prox Cx to Dist Cx lesion is 20% stenosed.  Mid LAD lesion is 30% stenosed.  There is mild to moderate left ventricular systolic dysfunction.  LV end diastolic pressure is mildly elevated.  The left ventricular ejection fraction is 35-45% by visual estimate.  There is no mitral valve regurgitation.    Laboratory Data:  High Sensitivity Troponin:   Recent Labs  Lab 06/05/19 1738 06/05/19 1936 06/17/19 1600 06/17/19 2022 06/21/19 1005  TROPONINIHS 13 13 29* 30* 11     Chemistry Recent Labs  Lab 06/20/19 0529 06/21/19 0500 06/21/19 1005  NA 133* 136 136  K 4.2 5.2* 4.4  CL 101 103  101  CO2 21* 17* 23  GLUCOSE 134* 119* 198*  BUN 23* 27* 26*  CREATININE 1.26* 1.16 1.32*  CALCIUM 8.2* 8.6* 8.6*  GFRNONAA >60 >60 59*  GFRAA >60 >60 >60  ANIONGAP 11 16* 12    Recent Labs  Lab 06/18/19 0510 06/19/19 0356 06/20/19 0529  PROT 6.8 6.7 6.1*  ALBUMIN 3.0* 2.6* 2.4*  AST 40 29 46*  ALT 23 17 29   ALKPHOS 61 49 54  BILITOT 1.4* 0.5 0.4   Hematology Recent Labs  Lab 06/19/19 0356 06/20/19 0529 06/21/19 0500  WBC 5.2 5.9 5.3  RBC 4.92 4.82 5.25  HGB 14.9 14.6 16.0  HCT 43.5 41.7 47.0  MCV 88.4 86.5 89.5  MCH 30.3 30.3 30.5  MCHC 34.3 35.0 34.0  RDW 11.7 11.7 11.9  PLT 133* 146* 172   BNP Recent Labs  Lab 06/17/19 1643 06/21/19 1005  BNP 84.1 112.9*    DDimer  Recent Labs  Lab 06/18/19 0510 06/21/19 1005  DDIMER 0.63* 0.55*     Radiology/Studies:  CT HEAD WO CONTRAST  Result Date: 06/17/2019 CLINICAL DATA:  Right-sided weakness, 2 days duration. Recent falling. EXAM: CT HEAD WITHOUT CONTRAST TECHNIQUE: Contiguous axial images were obtained from the base of the skull through the vertex without intravenous contrast. COMPARISON:  MRI 10/14/2017 FINDINGS: Brain: The brain shows a normal appearance without evidence of malformation, atrophy, old or acute small or large vessel infarction, mass lesion, hemorrhage, hydrocephalus or  extra-axial collection. Vascular: There is atherosclerotic calcification of the major vessels at the base of the brain. Skull: Normal.  No traumatic finding.  No focal bone lesion. Sinuses/Orbits: Sinuses are clear. Orbits appear normal. Mastoids are clear. Other: None significant IMPRESSION: Normal head CT except for atherosclerotic calcification of the major vessels at the base of the brain. Electronically Signed   By: Nelson Chimes M.D.   On: 06/17/2019 14:41   MR BRAIN W WO CONTRAST  Result Date: 06/17/2019 CLINICAL DATA:  Neuro deficit. Subacute right-sided weakness. EXAM: MRI HEAD WITHOUT AND WITH CONTRAST TECHNIQUE: Multiplanar, multiecho pulse sequences of the brain and surrounding structures were obtained without and with intravenous contrast. CONTRAST:  71m GADAVIST GADOBUTROL 1 MMOL/ML IV SOLN COMPARISON:  CT head 06/17/2019. MRI 10/14/2017 FINDINGS: Brain: No acute infarction, hemorrhage, hydrocephalus, extra-axial collection or mass lesion. No significant chronic ischemia Normal enhancement postcontrast administration Vascular: Normal arterial flow voids Skull and upper cervical spine: No focal skeletal lesion. Sinuses/Orbits: Mild mucosal edema paranasal sinuses. Normal orbit Other: None IMPRESSION: Negative MRI head with contrast. Electronically Signed   By: CFranchot GalloM.D.   On: 06/17/2019 19:01   MR THORACIC SPINE WO CONTRAST  Result Date: 06/20/2019 CLINICAL DATA:  Initial evaluation for mid back pain. EXAM: MRI THORACIC SPINE WITHOUT CONTRAST TECHNIQUE: Multiplanar, multisequence MR imaging of the thoracic spine was performed. No intravenous contrast was administered. COMPARISON:  None available. FINDINGS: Alignment: Vertebral bodies normally aligned with preservation of the normal thoracic kyphosis. No listhesis or subluxation. Vertebrae: Vertebral body height maintained without evidence for acute or chronic fracture. Bone marrow signal intensity within normal limits. Small benign  hemangioma noted within the T9 vertebral body. No worrisome osseous lesions. No abnormal marrow edema. Cord: Signal intensity within the thoracic spinal cord is within normal limits. Paraspinal and other soft tissues: Paraspinal soft tissues demonstrate no acute finding. Patchy multifocal opacities noted within the partially visualized lungs, likely related history of COVID infection. Small T2 hyperintense simple right renal cyst noted. Visualized visceral structures otherwise  unremarkable. Disc levels: T1-2:  Unremarkable. T2-3: Unremarkable. T3-4: Tiny left paracentral disc protrusion minimally indents the left ventral thecal sac. No significant stenosis or cord deformity. T4-5:  Mild disc bulge. No significant canal or foraminal stenosis. T5-6: Small central disc protrusion minimally indents the ventral thecal sac. No significant stenosis or cord deformity. T6-7: Central/right paracentral disc protrusion indents the ventral thecal sac. Mild flattening of the ventral spinal cord without cord signal changes. No significant stenosis. T7-8: Central disc protrusion indents the ventral thecal sac, contacting and mildly flattening the ventral thoracic cord. Superimposed prominence of the dorsal epidural fat. No significant spinal stenosis. Foramina remain patent. T8-9: Small central disc protrusion indents the ventral thecal sac. Associated annular fissure. Minimal flattening of the ventral cord without cord signal changes. No significant stenosis. T9-10: Mild disc bulge with disc desiccation. Mild posterior element hypertrophy. No significant stenosis. T10-11:  Unremarkable. T11-12:  Unremarkable. T12-L1:  Unremarkable. IMPRESSION: 1. Multifocal disc protrusions involving the T3-4 through T9-10 interspaces as above, most prominent of which is positioned at the T7-8 level. No significant spinal stenosis. Findings could contribute to underlying back pain. 2. Patchy multifocal opacities within the partially visualized  lungs, likely related to history of COVID infection. Electronically Signed   By: Jeannine Boga M.D.   On: 06/20/2019 00:04   MR LUMBAR SPINE WO CONTRAST  Result Date: 06/18/2019 CLINICAL DATA:  Initial evaluation for unilateral right lower extremity weakness, tingling in toes. EXAM: MRI LUMBAR SPINE WITHOUT CONTRAST TECHNIQUE: Multiplanar, multisequence MR imaging of the lumbar spine was performed. No intravenous contrast was administered. COMPARISON:  Prior radiograph from 01/18/2012. FINDINGS: Segmentation: Standard. Lowest well-formed disc space labeled the L5-S1 level. Alignment: Physiologic with preservation of the normal lumbar lordosis. No listhesis. Vertebrae: Vertebral body height maintained without evidence for acute or chronic fracture. Bone marrow signal intensity somewhat diffusely decreased on T1 weighted imaging, nonspecific, but most commonly related to anemia, smoking, or obesity. Subcentimeter benign hemangioma noted within the L4 vertebral body. No other discrete or worrisome osseous lesions. No abnormal marrow edema. Conus medullaris and cauda equina: Conus extends to the L1 level. Conus and cauda equina appear normal. Paraspinal and other soft tissues: Paraspinous soft tissues within normal limits. Partially visualized urinary bladder moderately distended. Visualized visceral structures otherwise unremarkable. Disc levels: L1-2: Disc desiccation without significant disc bulge. No canal or neural foraminal stenosis. No impingement. L2-3: Mild disc desiccation without significant disc bulge. No canal or neural foraminal stenosis. L3-4: Disc desiccation with minimal annular disc bulge. No significant canal or neural foraminal stenosis. No impingement. L4-5: Disc desiccation with minimal annular disc bulge. Superimposed small left foraminal disc protrusion closely approximates the exiting left L4 nerve root without frank neural impingement (series 4, image 11). No significant canal or  lateral recess stenosis. Mild left greater than right L4 foraminal stenosis. L5-S1: Negative interspace. Mild epidural lipomatosis. No canal or lateral recess stenosis. Foramina remain patent. No impingement. IMPRESSION: 1. Small left foraminal disc protrusion at L4-5, closely approximating and potentially irritating the exiting left L4 nerve root. 2. Mild left greater than right L4 foraminal stenosis related to disc bulge and facet disease. 3. Additional minor for age degenerative disc disease elsewhere within the lumbar spine without significant stenosis or neural impingement. No other findings to explain patient's right lower extremity symptoms identified. 4. Diffusely decreased T1 marrow signal intensity, nonspecific, but most commonly related to anemia, smoking, or obesity. Correlation with history and laboratory values recommended. Electronically Signed   By: Jeannine Boga  M.D.   On: 06/18/2019 21:06   DG CHEST PORT 1 VIEW  Result Date: 06/21/2019 CLINICAL DATA:  Hypoxemia and chest pain.  COVID 19 positive. EXAM: PORTABLE CHEST 1 VIEW COMPARISON:  06/19/2019 FINDINGS: Lungs are hypoinflated and demonstrate mild hazy opacification over the left base/retrocardiac region unchanged. No significant effusion. Cardiomediastinal silhouette and remainder of the exam is unchanged. IMPRESSION: Stable hazy opacification over the left base/retrocardiac region. Electronically Signed   By: Marin Olp M.D.   On: 06/21/2019 10:10   DG Chest Port 1 View  Result Date: 06/19/2019 CLINICAL DATA:  Cough. Right-sided weakness. EXAM: PORTABLE CHEST 1 VIEW COMPARISON:  06/17/2019 FINDINGS: There is a new focal area of atelectasis or infiltrate at the left lung base posterior medially. Minimal atelectasis at the right lung base. Lungs are otherwise clear. No effusions. Heart size and pulmonary vascularity are normal. No bone abnormality. IMPRESSION: 1. New focal area of atelectasis or infiltrate at the left lung  base posterior medially. 2. Minimal new atelectasis at the right lung base. Electronically Signed   By: Lorriane Shire M.D.   On: 06/19/2019 16:46    HEAR Score (for undifferentiated chest pain):  HEAR Score: 4    Assessment and Plan:   Atypical chest pain Patient admitted for SOB, dizziness, and right sided weakness. Work-up negative for stroke. Found to have [place]. Reported intermittent chest pain that is reproducible on palpation, sharp right-sided pain that radiates to the left. Similar to MSK pain he had in July.  - He reported h/o of MI in 2015. He had a cath in 2019 showing nonobstructive CAD with EF 35-40% - Asked patient to press on his chest and says that is the same pain>> likely MSK - EKG showed old TWI and minimal ST elevation in V2 - HS troponin 11>> Continue to trend - With recent cath showing nonobstructive CAD and patient's COVID positive status would not likely proceed with further ischemic work-up. If troponin trends up or pain is worse/different can consider.   Chronic combined Heart Failure/Ischemic cardiomyopathy - EF in 01/2019 showed EF 35-40% - On lisinopril at home - Entresto discontinued for dizziness - Arlyce Harman unable to afford - continue Carvedilol - ACE held for kidney function  COVID - CXR with opacities at left base - On Remdesovor - per IM  Right lower extremity weakness - Stroke work-up negatve - MRI lumbar showed multifocal disc protrusion - Neurosurgery consulted and recommend PCP follow up  AKI on CKD III - Creatinine was up on admission and improved with IVF - creatinine today is 1.3 (around baseline)  DM2 - A1C 11.9 - SSI per IM   For questions or updates, please contact [facility] Please consult [email or web address] for contact info under     Signed, Cadence Ninfa Meeker, [name]  06/21/2019 12:44 PM  Patient examined chart reviewed. Discussed care with patient and PA Exam with pain to palpation chest, rhonchi lungs no murmur and euvolemic  appearing. BNP is essentially normal with no signs of CHF in setting of COVID infection.  Pain is atypical with negative troponin and no acute ECG changes are old in lateral leads Cath 202019 with non obstructive disease. Pain likely MSK related to COVID infection Dexamethasone should help He needs much better BS control especially in setting of steroid Rx. No need for further cardiac w/u will arrange outpatient f/u with Dr Julianne Handler  Continue statin and beta blocker Resume home dose lisinopril 10 mg over weekend when K < 4.5   Collier Salina  [employer]

## 2019-06-22 LAB — BASIC METABOLIC PANEL
Anion gap: 11 (ref 5–15)
BUN: 29 mg/dL — ABNORMAL HIGH (ref 6–20)
CO2: 24 mmol/L (ref 22–32)
Calcium: 8.7 mg/dL — ABNORMAL LOW (ref 8.9–10.3)
Chloride: 101 mmol/L (ref 98–111)
Creatinine, Ser: 1.35 mg/dL — ABNORMAL HIGH (ref 0.61–1.24)
GFR calc Af Amer: >60 mL/min (ref >60)
GFR calc non Af Amer: 57 mL/min — ABNORMAL LOW (ref >60)
Glucose, Bld: 147 mg/dL — ABNORMAL HIGH (ref 70–99)
Potassium: 4.3 mmol/L (ref 3.5–5.1)
Sodium: 136 mmol/L (ref 135–145)

## 2019-06-22 LAB — CBC
HCT: 49 % (ref 39.0–52.0)
Hemoglobin: 16.8 g/dL (ref 13.0–17.0)
MCH: 30.1 pg (ref 26.0–34.0)
MCHC: 34.3 g/dL (ref 30.0–36.0)
MCV: 87.7 fL (ref 80.0–100.0)
Platelets: 228 10*3/uL (ref 150–400)
RBC: 5.59 MIL/uL (ref 4.22–5.81)
RDW: 11.7 % (ref 11.5–15.5)
WBC: 8 10*3/uL (ref 4.0–10.5)
nRBC: 0 % (ref 0.0–0.2)

## 2019-06-22 LAB — GLUCOSE, CAPILLARY
Glucose-Capillary: 209 mg/dL — ABNORMAL HIGH (ref 70–99)
Glucose-Capillary: 98 mg/dL (ref 70–99)
Glucose-Capillary: 313 mg/dL — ABNORMAL HIGH (ref 70–99)
Glucose-Capillary: 105 mg/dL — ABNORMAL HIGH (ref 70–99)

## 2019-06-22 LAB — C-REACTIVE PROTEIN: CRP: 9.8 mg/dL — ABNORMAL HIGH (ref <1.0)

## 2019-06-22 MED ORDER — GLUCERNA SHAKE PO LIQD
237.0000 mL | Freq: Two times a day (BID) | ORAL | Status: DC
  Administered 2019-06-22 – 2019-07-01 (×19): 237 mL via ORAL
  Filled 2019-06-22 (×2): qty 237

## 2019-06-22 MED ORDER — VITAMIN D (ERGOCALCIFEROL) 1.25 MG (50000 UNIT) PO CAPS
50000.0000 [IU] | ORAL_CAPSULE | ORAL | Status: DC
  Administered 2019-06-22 – 2019-06-29 (×2): 50000 [IU] via ORAL
  Filled 2019-06-22 (×2): qty 1

## 2019-06-22 NOTE — Progress Notes (Signed)
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  He was found to have SIRS coronavirus 2 infection.  MRI of the brain was negative for stroke.  MRI of the lumbar spine:Small left foraminal disc protrusion at L4-5, closely approximating and potentially irritating the exiting left L4 nerve root. Mild left greater than right L4 foraminal stenosis related to disc bulge and facet disease.   Assessment & Plan:   Principal Problem:   Right sided weakness Active Problems:   DM (diabetes mellitus) (HCC)   HLD (hyperlipidemia)   Chronic systolic heart failure (HCC)   Essential hypertension   OSA on CPAP   Onychomycosis of multiple toenails with type 2 diabetes mellitus and peripheral neuropathy (HCC)   MCI (mild cognitive impairment)   Diabetic neuropathy (HCC)   AF (paroxysmal atrial fibrillation) (HCC)   AKI (acute kidney injury) ([place])   COVID-19 virus detected  1--COVID-19 infection: Patient presented with elevated inflammatory markers, CRP at 18. He report cough, chest x ray with opacification left bases.  Continue to monitor inflammatory markers. On dexamethasone for hypoxemia.  Started on remdesivir day 3.  Worsening Hypoxemia 12-18, during episode of chest pain. Chest x ray not significant changes. Continue with albuterol.  Oxygen sat 92 on 3 L.  He report breathing better, feels weak , tired,.  Received one dose if lasix.  COVID-19 Labs  Recent Labs    06/20/19 0529 06/21/19 0500 06/21/19 1005 06/22/19 0426  DDIMER  --   --  0.55*  --   FERRITIN 405*  --   --   --   CRP 13.1* 11.3*  --  9.8*    Lab Results  Component Value  Date   SARSCOV2NAA POSITIVE (A) 06/17/2019   SARSCOV2NAA NOT DETECTED 06/05/2019   [place] NEGATIVE 01/23/2019    3-Chest pain.  Resolved after nitroglycerin.  EKG with mild st elevation only V 2. T wave inversion.  Cardiology consulted. They think is more MSK pain.  Continue with aspirin, lipitor.  He is on eliquis, D dimer not significantly elevated.  Resolved.  Started IV PP.   AKI on chronic kidney disease a stage III AB: Patient creatinine baseline 1.2.  Peak of 1.6 improved with IV fluids. Stable.   -Diabetes type 2: Poorly controlled Hemoglobin A1c 11.9 Continue with Lantus and sliding scale insulin  -peripheral neuropathy: Continue with amitriptyline  Orthostatic hypotension;  Will avoid further fluids due t history of HF>  Encourage oral intake.  Holding lisinopril.  Chronic systolic heart failure: Compensated Received one dose of lasix 12-18.    Hyperlipidemia: Continue with Zetia  Right lower extremity weakness: -MRI of brain negative. -MRI of lumbar spine:Small left foraminal disc protrusion at L4-5, closely approximating and potentially irritating the exiting left L4 nerve root. Mild left greater than right L4 foraminal stenosis related to disc bulge and facet disease. Prior episode of the same. -On  steroids. -MRI thoracic spine ordered. Showed; Multifocal disc protrusions involving the T3-4 through T9-10 interspaces as above, most prominent of which is positioned at the T7-8 level. No significant spinal stenosis. Findings could contribute to underlying back pain. Discussed with PA, from neurosurgery, MRI finding does not explain weakness. Nothing that will  need surgery. Patient needs to follow up with PCP.  Hypertension: Continue with Coreg.  Holding lisinopril due to AKI. Mild elevation of troponin: Suspect related to acute illness. chest pain-free Hyperkalemia; resolved with fluids.  Estimated body mass index is 26.45 kg/m as calculated from the  following:   Height as of 05/27/19: 5' 4"  (4.259 m).   Weight as of this encounter: 69.9 kg.   DVT prophylaxis: on eliquis.  Code Status: full code Family Communication: Care discussed with Patient. Sister updated 12-18 Disposition Plan: Remain I the hospital for treatment of Covid 19 infection, AKI, hypoxemia, and tachypnea.  Consultants:   Neurosurgery   Procedures:   none  Antimicrobials:    Subjective: He is breathing better, denies chest pain or abdominal pain.  He is not eating, report poor appetite, feels tired, weak.   Objective: Vitals:   06/21/19 1658 06/21/19 2037 06/21/19 2358 06/22/19 0519  BP: 117/63  125/74 102/62  Pulse: 81  74   Resp:   18   Temp:  98.5 F (36.9 C) 97.9 F (36.6 C) 99.2 F (37.3 C)  TempSrc:   Oral   SpO2:   90%   Weight:    69.9 kg    Intake/Output Summary (Last 24 hours) at 06/22/2019 1033 Last data filed at 06/21/2019 1526 Gross per 24 hour  Intake -  Output 580 ml  Net -580 ml   Filed Weights   06/20/19 0611 06/21/19 0332 06/22/19 0519  Weight: 79.5 kg 79.5 kg 69.9 kg    Examination:  General exam: NAD Respiratory system: CTA Cardiovascular system: S 1, S 2 RRR Gastrointestinal system: BS present, soft, nt Central nervous system; alert, following command Extremities: right LE 4/5 left 5/5 Skin: no rashes    Data Reviewed: I have personally reviewed following labs and imaging studies  CBC: Recent Labs  Lab 06/17/19 1240 06/17/19 1643 06/18/19 0510 06/19/19 0356 06/20/19 0529 06/21/19 0500 06/22/19 0426  WBC 5.4  --  6.1 5.2 5.9 5.3 8.0  NEUTROABS 3.9  --   --   --   --   --   --   HGB 16.3   < > 16.4 14.9 14.6 16.0 16.8  HCT 47.6   < > 48.7 43.5 41.7 47.0 49.0  MCV 89.1  --  90.0 88.4 86.5 89.5 87.7  PLT 142*  --  134* 133* 146* 172 228   < > = values in this interval not displayed.   Basic Metabolic Panel: Recent Labs  Lab 06/17/19 1240 06/18/19 0510 06/19/19 0356 06/20/19 0529 06/21/19  0500 06/21/19 1005 06/22/19 0426  NA 133* 134* 134* 133* 136 136 136  K 4.0 5.3* 4.4 4.2 5.2* 4.4 4.3  CL 97* 100 101 101 103 101 101  CO2 22 22 20* 21* 17* 23 24  GLUCOSE 254* 176* 213* 134* 119* 198* 147*  BUN 21* 23* 23* 23* 27* 26* 29*  CREATININE 1.65* 1.39* 1.30* 1.26* 1.16 1.32* 1.35*  CALCIUM 8.6* 8.5* 8.2* 8.2* 8.6* 8.6* 8.7*  MG 2.4 2.6* 2.5* 2.6* 2.7*  --   --   PHOS 3.1  --   --   --   --   --   --    GFR: Estimated Creatinine Clearance: 49.3 mL/min (A) (by C-G formula based on SCr of 1.35 mg/dL (H)). Liver Function Tests: Recent Labs  Lab 06/17/19 1240 06/18/19 0510 06/19/19 0356 06/20/19 0529  AST 23 40 29 46*  ALT 16 23 17 29   ALKPHOS  66 61 49 54  BILITOT 0.6 1.4* 0.5 0.4  PROT 7.2 6.8 6.7 6.1*  ALBUMIN 3.4* 3.0* 2.6* 2.4*   No results for input(s): LIPASE, AMYLASE in the last 168 hours. No results for input(s): AMMONIA in the last 168 hours. Coagulation Profile: Recent Labs  Lab 06/17/19 1240  INR 1.0   Cardiac Enzymes: Recent Labs  Lab 06/17/19 1643 06/19/19 0356  CKTOTAL 224 138   BNP (last 3 results) No results for input(s): PROBNP in the last 8760 hours. HbA1C: No results for input(s): HGBA1C in the last 72 hours. CBG: Recent Labs  Lab 06/21/19 0800 06/21/19 1136 06/21/19 1648 06/21/19 2041 06/22/19 0738  GLUCAP 113* 172* 128* 166* 209*   Lipid Profile: No results for input(s): CHOL, HDL, LDLCALC, TRIG, CHOLHDL, LDLDIRECT in the last 72 hours. Thyroid Function Tests: No results for input(s): TSH, T4TOTAL, FREET4, T3FREE, THYROIDAB in the last 72 hours. Anemia Panel: Recent Labs    06/20/19 0529  FERRITIN 405*   Sepsis Labs: Recent Labs  Lab 06/17/19 1643  PROCALCITON 0.35    Recent Results (from the past 240 hour(s))  SARS CORONAVIRUS 2 (TAT 6-24 HRS) Nasopharyngeal Nasopharyngeal Swab     Status: Abnormal   Collection Time: 06/17/19  4:20 PM   Specimen: Nasopharyngeal Swab  Result Value Ref Range Status   SARS  Coronavirus 2 POSITIVE (A) NEGATIVE Final    Comment: RESULT CALLED TO, READ BACK BY AND VERIFIED WITH: RN MONIQUE DAVIS AT [place] ON 06/17/2019 (NOTE) SARS-CoV-2 target nucleic acids are DETECTED. The SARS-CoV-2 RNA is generally detectable in upper and lower respiratory specimens during the acute phase of infection. Positive results are indicative of the presence of SARS-CoV-2 RNA. Clinical correlation with patient history and other diagnostic information is  necessary to determine patient infection status. Positive results do not rule out bacterial infection or co-infection with other viruses.  The expected result is Negative. Fact Sheet for Patients: SugarRoll.be Fact Sheet for Healthcare Providers: https://www.woods-mathews.com/ This test is not yet approved or cleared by the [place] FDA and  has been authorized for detection and/or diagnosis of SARS-CoV-2 by FDA under an Emergency Use Authorization (EUA). This EUA will remain  in effect (meaning this t est can be used) for the duration of the COVID-19 declaration under Section 564(b)(1) of the Act, 21 U.S.C. section 360bbb-3(b)(1), unless the authorization is terminated or revoked sooner. Performed at [facility], [place] [address], [place], [place] [address]          Radiology Studies: DG Abd 1 View  Result Date: 06/21/2019 CLINICAL DATA:  Abdominal pain. EXAM: ABDOMEN - 1 VIEW COMPARISON:  Scout image for CT scan of the abdomen dated 01/23/2019 FINDINGS: There is air scattered throughout the nondistended large and small bowel. Stomach is not distended. No abnormal abdominal calcifications. Bones are normal. IMPRESSION: Benign-appearing abdomen. Electronically Signed   By: Lorriane Shire M.D.   On: 06/21/2019 14:19   DG CHEST PORT 1 VIEW  Result Date: 06/21/2019 CLINICAL DATA:  Hypoxemia and chest pain.  COVID 19 positive. EXAM: PORTABLE CHEST 1 VIEW  COMPARISON:  06/19/2019 FINDINGS: Lungs are hypoinflated and demonstrate mild hazy opacification over the left base/retrocardiac region unchanged. No significant effusion. Cardiomediastinal silhouette and remainder of the exam is unchanged. IMPRESSION: Stable hazy opacification over the left base/retrocardiac region. Electronically Signed   By: Marin Olp M.D.   On: 06/21/2019 10:10        Scheduled Meds: . albuterol  1-2 puff Inhalation Q6H  . amitriptyline  20 mg Oral QHS  . apixaban  5 mg Oral BID  . aspirin EC  81 mg Oral Daily  . atorvastatin  40 mg Oral q1800  . carvedilol  6.25 mg Oral BID WC  . dexamethasone (DECADRON) injection  6 mg Intravenous Q24H  . docusate sodium  100 mg Oral BID  . ezetimibe  10 mg Oral Daily  . feeding supplement (GLUCERNA SHAKE)  237 mL Oral BID BM  . insulin aspart  0-15 Units Subcutaneous TID WC  . insulin aspart  0-5 Units Subcutaneous QHS  . insulin aspart  3 Units Subcutaneous TID WC  . insulin glargine  20 Units Subcutaneous QHS  . pantoprazole (PROTONIX) IV  40 mg Intravenous Q12H  . polyethylene glycol  17 g Oral Daily  . senna-docusate  2 tablet Oral QHS  . Vitamin D (Ergocalciferol)  50,000 Units Oral Q7 days   Continuous Infusions: . remdesivir 100 mg in NS 100 mL 100 mg (06/22/19 0940)     LOS: 3 days    Time spent: 35 minutes.     Elmarie Shiley, MD [facility]   If 7PM-7AM, please contact night-coverage [email or web address] Password [email or web address] 06/22/2019, 10:33 AM

## 2019-06-23 ENCOUNTER — Inpatient Hospital Stay (HOSPITAL_COMMUNITY): Payer: Medicare HMO

## 2019-06-23 DIAGNOSIS — U071 COVID-19: Principal | ICD-10-CM

## 2019-06-23 DIAGNOSIS — J1289 Other viral pneumonia: Secondary | ICD-10-CM

## 2019-06-23 DIAGNOSIS — J9601 Acute respiratory failure with hypoxia: Secondary | ICD-10-CM

## 2019-06-23 LAB — BLOOD GAS, ARTERIAL
Acid-base deficit: 1.4 mmol/L (ref 0.0–2.0)
Bicarbonate: 21.8 mmol/L (ref 20.0–28.0)
Drawn by: 270221
FIO2: 60
O2 Saturation: 99 %
Patient temperature: 37
pCO2 arterial: 30.3 mmHg — ABNORMAL LOW (ref 32.0–48.0)
pH, Arterial: 7.47 — ABNORMAL HIGH (ref 7.350–7.450)
pO2, Arterial: 204 mmHg — ABNORMAL HIGH (ref 83.0–108.0)

## 2019-06-23 LAB — D-DIMER, QUANTITATIVE: D-Dimer, Quant: 0.65 ug/mL-FEU — ABNORMAL HIGH (ref 0.00–0.50)

## 2019-06-23 LAB — CBC
HCT: 42.1 % (ref 39.0–52.0)
Hemoglobin: 14.6 g/dL (ref 13.0–17.0)
MCH: 30.2 pg (ref 26.0–34.0)
MCHC: 34.7 g/dL (ref 30.0–36.0)
MCV: 87 fL (ref 80.0–100.0)
Platelets: 264 10*3/uL (ref 150–400)
RBC: 4.84 MIL/uL (ref 4.22–5.81)
RDW: 11.7 % (ref 11.5–15.5)
WBC: 8.6 10*3/uL (ref 4.0–10.5)
nRBC: 0 % (ref 0.0–0.2)

## 2019-06-23 LAB — COMPREHENSIVE METABOLIC PANEL
ALT: 31 U/L (ref 0–44)
AST: 36 U/L (ref 15–41)
Albumin: 2.5 g/dL — ABNORMAL LOW (ref 3.5–5.0)
Alkaline Phosphatase: 60 U/L (ref 38–126)
Anion gap: 10 (ref 5–15)
BUN: 21 mg/dL — ABNORMAL HIGH (ref 6–20)
CO2: 26 mmol/L (ref 22–32)
Calcium: 8.4 mg/dL — ABNORMAL LOW (ref 8.9–10.3)
Chloride: 100 mmol/L (ref 98–111)
Creatinine, Ser: 1.27 mg/dL — ABNORMAL HIGH (ref 0.61–1.24)
GFR calc Af Amer: >60 mL/min (ref >60)
GFR calc non Af Amer: >60 mL/min (ref >60)
Glucose, Bld: 67 mg/dL — ABNORMAL LOW (ref 70–99)
Potassium: 4.1 mmol/L (ref 3.5–5.1)
Sodium: 136 mmol/L (ref 135–145)
Total Bilirubin: 0.9 mg/dL (ref 0.3–1.2)
Total Protein: 6.2 g/dL — ABNORMAL LOW (ref 6.5–8.1)

## 2019-06-23 LAB — GLUCOSE, CAPILLARY
Glucose-Capillary: 163 mg/dL — ABNORMAL HIGH (ref 70–99)
Glucose-Capillary: 65 mg/dL — ABNORMAL LOW (ref 70–99)
Glucose-Capillary: 157 mg/dL — ABNORMAL HIGH (ref 70–99)
Glucose-Capillary: 286 mg/dL — ABNORMAL HIGH (ref 70–99)
Glucose-Capillary: 227 mg/dL — ABNORMAL HIGH (ref 70–99)

## 2019-06-23 LAB — C-REACTIVE PROTEIN: CRP: 20.9 mg/dL — ABNORMAL HIGH (ref <1.0)

## 2019-06-23 LAB — ABO/RH: ABO/RH(D): AB POS

## 2019-06-23 LAB — PROCALCITONIN: Procalcitonin: 0.21 ng/mL

## 2019-06-23 MED ORDER — SODIUM CHLORIDE 0.9 % IV SOLN
100.0000 mg | Freq: Every day | INTRAVENOUS | Status: AC
  Administered 2019-06-23: 100 mg via INTRAVENOUS
  Filled 2019-06-23: qty 20

## 2019-06-23 MED ORDER — DEXAMETHASONE SODIUM PHOSPHATE 10 MG/ML IJ SOLN: 6.0000 mg | Freq: Two times a day (BID) | INTRAMUSCULAR | Status: DC

## 2019-06-23 MED ORDER — FUROSEMIDE 10 MG/ML IJ SOLN
20.0000 mg | Freq: Once | INTRAMUSCULAR | Status: AC
  Administered 2019-06-23: 20 mg via INTRAVENOUS
  Filled 2019-06-23: qty 2

## 2019-06-23 MED ORDER — ZINC SULFATE 220 (50 ZN) MG PO CAPS
220.0000 mg | ORAL_CAPSULE | Freq: Every day | ORAL | Status: DC
  Administered 2019-06-23 – 2019-07-01 (×9): 220 mg via ORAL
  Filled 2019-06-23 (×9): qty 1

## 2019-06-23 MED ORDER — SODIUM CHLORIDE 0.9% IV SOLUTION: Freq: Once | INTRAVENOUS | Status: AC

## 2019-06-23 MED ORDER — ASCORBIC ACID 500 MG PO TABS
500.0000 mg | ORAL_TABLET | Freq: Every day | ORAL | Status: DC
  Administered 2019-06-23 – 2019-07-01 (×8): 500 mg via ORAL
  Filled 2019-06-23 (×10): qty 1

## 2019-06-23 MED ORDER — DEXAMETHASONE SODIUM PHOSPHATE 10 MG/ML IJ SOLN
6.0000 mg | Freq: Two times a day (BID) | INTRAMUSCULAR | Status: DC
  Administered 2019-06-23 – 2019-06-24 (×4): 6 mg via INTRAVENOUS
  Filled 2019-06-23 (×4): qty 1

## 2019-06-23 MED ORDER — TOCILIZUMAB 400 MG/20ML IV SOLN
8.0000 mg/kg | Freq: Once | INTRAVENOUS | Status: AC
  Administered 2019-06-23: 542 mg via INTRAVENOUS
  Filled 2019-06-23: qty 27.1

## 2019-06-23 NOTE — Progress Notes (Signed)
Patient placed on 15L [place] high flow nasal cannula.  Patient states that the cannula feels better than the mask. Will continue to monitor.

## 2019-06-23 NOTE — Progress Notes (Signed)
Helped placed patient in prone position.

## 2019-06-23 NOTE — Progress Notes (Signed)
Patient not able to maintain saturation with minimal movements. 02 saturation decreasing to mid 70s then increasing to low 80s after 10 minutes while on [place] 5L. Increased 02 to [place] 6L. 02 saturation increased to mid 80s after 10 minutes. Patient felt in distress verbalizing 'help me". Verbalizing feeling tired and weak. Patient placed on non-rebreather 10 L. 02 saturation increased to low 90s. Patient attempting to rest. Will monitor

## 2019-06-23 NOTE — Progress Notes (Signed)
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  He was found to have SIRS coronavirus 2 infection.  MRI of the brain was negative for stroke.  MRI of the lumbar spine:Small left foraminal disc protrusion at L4-5, closely approximating and potentially irritating the exiting left L4 nerve root. Mild left greater than right L4 foraminal stenosis related to disc bulge and facet disease.  Patient develops progressive respiratory failure, now requiring 15 L oxygen, CRP increase to 20, will Proceed with Actemra and Convalescent plasma.   Assessment & Plan:   Principal Problem:   Right sided weakness Active Problems:   DM (diabetes mellitus) (HCC)   HLD (hyperlipidemia)   Chronic systolic heart failure (HCC)   Essential hypertension   OSA on CPAP   Onychomycosis of multiple toenails with type 2 diabetes mellitus and peripheral neuropathy (HCC)   MCI (mild cognitive impairment)   Diabetic neuropathy (HCC)   AF (paroxysmal atrial fibrillation) (HCC)   AKI (acute kidney injury) ([place])   COVID-19 virus detected  1--COVID-19 infection: -Patient presented with elevated inflammatory markers, CRP at 18. -He report cough, chest x ray with opacification left bases.  -Continue to monitor inflammatory markers. -Will change Dexamethasone to BID>  -Started on remdesivir day 4. Will extend course until improved.   -Worsening Hypoxemia 12-18, during episode of chest pain. Chest x ray not significant changes. -Continue with albuterol.  -Received one dose if lasix 12-18.  -Worsening hypoxemia and work of breathing on 12-20, will check Stat Chest x ray, ABG, will  ask CCM to evaluate might need to be transfer to ICU to monitor respiratory status closely.  -Will check pro calcitonin, if not elevated will proceed with Actemra. Discussed with patient risk and benefit from actemra and its anecdotal use.   The treatment plan and use of convalescent plasma  and known side effects were discussed with patient/family, they were clearly explained that there is no proven definitive treatment for COVID-19 infection, any medications used here are based on published clinical articles/anecdotal data which are not peer-reviewed or randomized control trials.  Complete risks and long-term side effects are unknown, however in the best clinical judgment they seem to be of some clinical benefit rather than medical risks.  Patient/family agree with the treatment plan and want to receive the given medications.  COVID-19 Labs  Recent Labs    06/21/19 0500 06/21/19 1005 06/22/19 0426 06/23/19 0355  DDIMER  --  0.55*  --  0.65*  CRP 11.3*  --  9.8* 20.9*    Lab Results  Component Value Date   SARSCOV2NAA POSITIVE (A) 06/17/2019   SARSCOV2NAA NOT DETECTED 06/05/2019   [place] NEGATIVE 01/23/2019    3-Chest pain.  Resolved after nitroglycerin.  EKG with mild st elevation only V 2. T wave inversion.  Cardiology consulted. They think is more MSK pain.  Continue with aspirin, lipitor.  He is on eliquis, D dimer not significantly elevated.  Resolved.  Started IV PP.   AKI on chronic kidney disease a stage III AB: Patient creatinine baseline 1.2.  Peak of 1.6 improved with IV fluids. Stable.   -Diabetes type 2: Poorly controlled Hemoglobin A1c 11.9 Continue with Lantus and sliding scale  insulin  -peripheral neuropathy: Continue with amitriptyline  Orthostatic hypotension;  Will avoid further fluids due t history of HF>  Encourage oral intake.  Holding lisinopril.  Chronic systolic heart failure: Compensated Received one dose of lasix 12-18.     Hyperlipidemia: Continue with Zetia  Right lower extremity weakness: -MRI of brain negative. -MRI of lumbar spine:Small left foraminal disc protrusion at L4-5, closely approximating and potentially irritating the exiting left L4 nerve root. Mild left greater than right L4 foraminal stenosis related to disc bulge and facet disease. Prior episode of the same. -On  steroids. -MRI thoracic spine ordered. Showed; Multifocal disc protrusions involving the T3-4 through T9-10 interspaces as above, most prominent of which is positioned at the T7-8 level. No significant spinal stenosis. Findings could contribute to underlying back pain. Discussed with PA, from neurosurgery, MRI finding does not explain weakness. Nothing that will need surgery. Patient needs to follow up with PCP.  Hypertension: Continue with Coreg.  Holding lisinopril due to AKI. Mild elevation of troponin: Suspect related to acute illness. chest pain-free Hyperkalemia; resolved with fluids.  Estimated body mass index is 25.62 kg/m as calculated from the following:   Height as of 05/27/19: 5' 4"  (1.626 m).   Weight as of this encounter: 67.7 kg.   DVT prophylaxis: on eliquis.  Code Status: full code Family Communication: Care discussed with Patient. Sister updated 12-20 Disposition Plan: Remain I the hospital for treatment of Covid 19 infection, AKI, hypoxemia, and tachypnea.  Consultants:   Neurosurgery   Procedures:   none  Antimicrobials:    Subjective: He is feeling tired, cant breath, he feels more comfortable in prone position.    Objective: Vitals:   06/22/19 2215 06/23/19 0549 06/23/19 0609 06/23/19 0727  BP: 129/80 120/68  130/78  Pulse: 84 100  97  Resp: (!) 21 (!) 27  (!) 32  Temp: 97.7 F (36.5 C) 98.3 F (36.8 C)    TempSrc: Oral Oral    SpO2: 91% 91% 93% 91%  Weight:  67.7 kg      Intake/Output Summary (Last 24 hours) at 06/23/2019 0816 Last data filed at 06/22/2019 2200 Gross per 24  hour  Intake 120 ml  Output 1125 ml  Net -1005 ml   Filed Weights   06/21/19 0332 06/22/19 0519 06/23/19 0549  Weight: 79.5 kg 69.9 kg 67.7 kg    Examination:  General exam:  Distress, ill appearing Respiratory system: Bilateral air movement, crackles bases, tachypnea.  Cardiovascular system: S 1, S 2 RRR Gastrointestinal system: BS present, soft, nt Central nervous system; Alert, weak, follows command Extremities: BL LE no edema Skin: no rashes    Data Reviewed: I have personally reviewed following labs and imaging studies  CBC: Recent Labs  Lab 06/17/19 1240 06/17/19 1643 06/18/19 0510 06/19/19 0356 06/20/19 0529 06/21/19 0500 06/22/19 0426  WBC 5.4  --  6.1 5.2 5.9 5.3 8.0  NEUTROABS 3.9  --   --   --   --   --   --   HGB 16.3   < > 16.4 14.9 14.6 16.0 16.8  HCT 47.6   < > 48.7 43.5 41.7 47.0 49.0  MCV 89.1  --  90.0 88.4 86.5 89.5 87.7  PLT 142*  --  134* 133* 146* 172 228   < > = values in this interval not displayed.   Basic Metabolic Panel: Recent Labs  Lab 06/17/19 1240 06/18/19 0510 06/19/19 0356 06/20/19 0529 06/21/19 0500 06/21/19 1005 06/22/19 0426 06/23/19  0355  NA 133* 134* 134* 133* 136 136 136 136  K 4.0 5.3* 4.4 4.2 5.2* 4.4 4.3 4.1  CL 97* 100 101 101 103 101 101 100  CO2 22 22 20* 21* 17* 23 24 26   GLUCOSE 254* 176* 213* 134* 119* 198* 147* 67*  BUN 21* 23* 23* 23* 27* 26* 29* 21*  CREATININE 1.65* 1.39* 1.30* 1.26* 1.16 1.32* 1.35* 1.27*  CALCIUM 8.6* 8.5* 8.2* 8.2* 8.6* 8.6* 8.7* 8.4*  MG 2.4 2.6* 2.5* 2.6* 2.7*  --   --   --   PHOS 3.1  --   --   --   --   --   --   --    GFR: Estimated Creatinine Clearance: 52.4 mL/min (A) (by C-G formula based on SCr of 1.27 mg/dL (H)). Liver Function Tests: Recent Labs  Lab 06/17/19 1240 06/18/19 0510 06/19/19 0356 06/20/19 0529 06/23/19 0355  AST 23 40 29 46* 36  ALT 16 23 17 29 31   ALKPHOS 66 61 49 54 60  BILITOT 0.6 1.4* 0.5 0.4 0.9  PROT 7.2 6.8 6.7 6.1* 6.2*  ALBUMIN 3.4*  3.0* 2.6* 2.4* 2.5*   No results for input(s): LIPASE, AMYLASE in the last 168 hours. No results for input(s): AMMONIA in the last 168 hours. Coagulation Profile: Recent Labs  Lab 06/17/19 1240  INR 1.0   Cardiac Enzymes: Recent Labs  Lab 06/17/19 1643 06/19/19 0356  CKTOTAL 224 138   BNP (last 3 results) No results for input(s): PROBNP in the last 8760 hours. HbA1C: No results for input(s): HGBA1C in the last 72 hours. CBG: Recent Labs  Lab 06/22/19 0738 06/22/19 1146 06/22/19 1628 06/22/19 2156 06/23/19 0739  GLUCAP 209* 98 313* 105* 65*   Lipid Profile: No results for input(s): CHOL, HDL, LDLCALC, TRIG, CHOLHDL, LDLDIRECT in the last 72 hours. Thyroid Function Tests: No results for input(s): TSH, T4TOTAL, FREET4, T3FREE, THYROIDAB in the last 72 hours. Anemia Panel: No results for input(s): VITAMINB12, FOLATE, FERRITIN, TIBC, IRON, RETICCTPCT in the last 72 hours. Sepsis Labs: Recent Labs  Lab 06/17/19 1643  PROCALCITON 0.35    Recent Results (from the past 240 hour(s))  SARS CORONAVIRUS 2 (TAT 6-24 HRS) Nasopharyngeal Nasopharyngeal Swab     Status: Abnormal   Collection Time: 06/17/19  4:20 PM   Specimen: Nasopharyngeal Swab  Result Value Ref Range Status   SARS Coronavirus 2 POSITIVE (A) NEGATIVE Final    Comment: RESULT CALLED TO, READ BACK BY AND VERIFIED WITH: RN MONIQUE DAVIS AT [place] ON 06/17/2019 (NOTE) SARS-CoV-2 target nucleic acids are DETECTED. The SARS-CoV-2 RNA is generally detectable in upper and lower respiratory specimens during the acute phase of infection. Positive results are indicative of the presence of SARS-CoV-2 RNA. Clinical correlation with patient history and other diagnostic information is  necessary to determine patient infection status. Positive results do not rule out bacterial infection or co-infection with other viruses.  The expected result is Negative. Fact Sheet for  Patients: SugarRoll.be Fact Sheet for Healthcare Providers: https://www.woods-mathews.com/ This test is not yet approved or cleared by the [place] FDA and  has been authorized for detection and/or diagnosis of SARS-CoV-2 by FDA under an Emergency Use Authorization (EUA). This EUA will remain  in effect (meaning this t est can be used) for the duration of the COVID-19 declaration under Section 564(b)(1) of the Act, 21 U.S.C. section 360bbb-3(b)(1), unless the authorization is terminated or revoked sooner. Performed at [facility],  [address], [place], [place] [address]          Radiology Studies: DG Abd 1 View  Result Date: 06/21/2019 CLINICAL DATA:  Abdominal pain. EXAM: ABDOMEN - 1 VIEW COMPARISON:  Scout image for CT scan of the abdomen dated 01/23/2019 FINDINGS: There is air scattered throughout the nondistended large and small bowel. Stomach is not distended. No abnormal abdominal calcifications. Bones are normal. IMPRESSION: Benign-appearing abdomen. Electronically Signed   By: Lorriane Shire M.D.   On: 06/21/2019 14:19   DG CHEST PORT 1 VIEW  Result Date: 06/21/2019 CLINICAL DATA:  Hypoxemia and chest pain.  COVID 19 positive. EXAM: PORTABLE CHEST 1 VIEW COMPARISON:  06/19/2019 FINDINGS: Lungs are hypoinflated and demonstrate mild hazy opacification over the left base/retrocardiac region unchanged. No significant effusion. Cardiomediastinal silhouette and remainder of the exam is unchanged. IMPRESSION: Stable hazy opacification over the left base/retrocardiac region. Electronically Signed   By: Marin Olp M.D.   On: 06/21/2019 10:10        Scheduled Meds: . albuterol  1-2 puff Inhalation Q6H  . amitriptyline  20 mg Oral QHS  . apixaban  5 mg Oral BID  . aspirin EC  81 mg Oral Daily  . atorvastatin  40 mg Oral q1800  . carvedilol  6.25 mg Oral BID WC  . dexamethasone (DECADRON) injection  6 mg Intravenous Q12H   . docusate sodium  100 mg Oral BID  . ezetimibe  10 mg Oral Daily  . feeding supplement (GLUCERNA SHAKE)  237 mL Oral BID BM  . insulin aspart  0-15 Units Subcutaneous TID WC  . insulin aspart  0-5 Units Subcutaneous QHS  . insulin aspart  3 Units Subcutaneous TID WC  . insulin glargine  20 Units Subcutaneous QHS  . pantoprazole (PROTONIX) IV  40 mg Intravenous Q12H  . polyethylene glycol  17 g Oral Daily  . senna-docusate  2 tablet Oral QHS  . Vitamin D (Ergocalciferol)  50,000 Units Oral Q7 days   Continuous Infusions: . remdesivir 100 mg in NS 100 mL       LOS: 4 days    Time spent: 35 minutes.     Elmarie Shiley, MD [facility]   If 7PM-7AM, please contact night-coverage [email or web address] Password [email or web address] 06/23/2019, 8:16 AM

## 2019-06-23 NOTE — Progress Notes (Signed)
MEWS/VS Documentation      06/23/2019 0700 06/23/2019 0727 06/23/2019 0800 06/23/2019 0852   MEWS Score:  2  2  3   0   MEWS Score Color:  Yellow  Yellow  Yellow  Green   Resp:  --  (!) 32  (!) 33  20   Pulse:  --  97  (!) 102  --   BP:  --  130/78  135/81  132/71   Temp:  --  --  97.7 F (36.5 C)  --   O2 Device:  --  Non-rebreather Mask  --  Non-rebreather Mask   O2 Flow Rate (L/min):  --  10 L/min  --  10 L/min   Level of Consciousness:  --  Alert  --  --      - Patient placed on prone position, comfortable and with stable vital signs; verbalized that breathing is a lot better and sating 100 % on O2 10L via NRB mask.   - No further interventions and complaints from pt. at this time.

## 2019-06-23 NOTE — Consult Note (Signed)
NAME:  Calvin [name], MRN:  [id number], DOB:  [birth year], LOS: 4 ADMISSION DATE:  06/17/2019, CONSULTATION DATE:  06/23/2019 REFERRING MD:  Hospitalist CHIEF COMPLAINT:  Hypoxia, respiratory distress    Brief History   58 year old gentleman admitted 06/17/2019 with COVID-19 pneumonia and associated hypoxic respiratory failure.  Increasing FiO2 requirements prompted PCCM consultation.  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 fib on Eliquis, OSA, hyperlipidemia admitted 06/1419 diagnosed with COVID-19 pneumonia.  He originally presented with right-sided weakness, generalized fatigue, nonspecific chest pain and shortness of breath.  Cardiology was consulted for chest pain which was thought to be musculoskeletal in nature as he had nonobstructive coronary artery disease by cath in 2019 with EF 35 to 40%.  Started on course of remdesivir and Decadron.  He has required escalating FiO2 with associated difficulty breathing which improved and self proning position and nonrebreather mask.  PCCM consulted due to increasing FiO2 requirements.  Past Medical History   Past Medical History:  Diagnosis Date   Adenoidal hypertrophy    Adenomatous colon polyps 2012   Arthritis    Diabetes mellitus    GERD (gastroesophageal reflux disease)    Heart attack ([place])    While living in [place]   Hx of adenomatous colonic polyps 08/18/2017   Hyperlipidemia    Hypertension    Mild CAD    a. cath in 08/2017 showing mild nonobstructive CAD with scattered 20-30% stenosis.    Nonischemic cardiomyopathy ([place])    a. EF 20-25% by echo in 09/2017 with cath showing mild CAD. b.  Last echo 12/2017 EF 35-40%, grade 2 DD.   Obesity    PAF (paroxysmal atrial fibrillation) ([place])    Sleep apnea    cpap, pt says no longer has     Significant Hospital Events   12/14 Admitted    Consults:  Cardiology PCCM  Procedures:  NA  Significant Diagnostic Tests:  12/14 CT Head Normal head CT except for atherosclerotic calcification of the major vessels at the base of the brain.  12/18 CXR  hazy opacification over the left base/retrocardiac region.  12/20 Bilateral pneumonia which is stable or minimally progressed on the right. Micro Data:  12/14 SARS Coronavirus 2 POSITIVE    Antimicrobials/COVID19 treatments  Remdesivir 12/17>> Decadron 12/15>>   Interim history/subjective:  Patient states he is feeling better after turning to his side and self proning.  Also better on nonrebreather.  Poor appetite.  Objective   Blood pressure 132/71, pulse (!) 102, temperature 97.7 F (36.5 C), temperature source Axillary, resp. rate 20, weight 67.7 kg, SpO2 100 %.        Intake/Output Summary (Last 24 hours) at 06/23/2019 0936 Last data filed at 06/22/2019 2200 Gross per 24 hour  Intake --  Output 1125 ml  Net -1125 ml   Filed Weights   06/21/19 0332 06/22/19 0519 06/23/19 0549  Weight: 79.5 kg 69.9 kg 67.7 kg    Examination: General: Well-developed, well nourished adult male lying right lateral recumbent. NAD HENT: Normocephalic, PERRL.  Dry mucus membranes Neck: No JVD. Trachea midline. CV: RRR. S1S2. No MRG. +2 distal pulses Lungs: BBS present, clear, FNL, symmetrical ABD: +BS x4. SNT/ND. No masses, guarding or rigidity GU: No Foley EXT: MAE well. No edema Skin: PWD. In tact. No rashes or lesions Neuro: A&Ox3. CN II-XII in tact. No focal deficits Psych: Flat but appropriate and expected mood, insight and judgment  for time and situation     Resolved Hospital Problem list   NA  Assessment & Plan:  58 year old male with comorbidities as listed with COVID-19 pneumonia and associated hypoxic respiratory failure.  Recommendations: Continue doing as you are, weaning FiO2 for SPO2 greater than or equal to 88%.  Self proning.  Patient is  tolerating this well and feeling much better on same.  These patients can tolerate lower SpO2 and high requirements of oxygen, including up to high flow nasal cannula and nonrebreather as long as they are not in acute respiratory distress. I discussed his wishes if he were to require intubation mechanical ventilation he states that he would. Continue remdesivir and Decadron per protocol. Added zinc and vitamin C. Continue supportive care.  Unless patient decompensates to the point of requiring intubation not much left to offer from a PCCM standpoint.  Please call if patient decompensates warranting intubation and mechanical ventilation.   We will be available but not routinely round.  Best practice: Per primary    Labs   CBC: Recent Labs  Lab 06/17/19 1240 06/17/19 1643 06/19/19 0356 06/20/19 0529 06/21/19 0500 06/22/19 0426 06/23/19 0857  WBC 5.4   < > 5.2 5.9 5.3 8.0 8.6  NEUTROABS 3.9  --   --   --   --   --   --   HGB 16.3   < > 14.9 14.6 16.0 16.8 14.6  HCT 47.6   < > 43.5 41.7 47.0 49.0 42.1  MCV 89.1   < > 88.4 86.5 89.5 87.7 87.0  PLT 142*   < > 133* 146* 172 228 264   < > = values in this interval not displayed.    Basic Metabolic Panel: Recent Labs  Lab 06/17/19 1240 06/18/19 0510 06/19/19 0356 06/20/19 0529 06/21/19 0500 06/21/19 1005 06/22/19 0426 06/23/19 0355  NA 133* 134* 134* 133* 136 136 136 136  K 4.0 5.3* 4.4 4.2 5.2* 4.4 4.3 4.1  CL 97* 100 101 101 103 101 101 100  CO2 22 22 20* 21* 17* 23 24 26   GLUCOSE 254* 176* 213* 134* 119* 198* 147* 67*  BUN 21* 23* 23* 23* 27* 26* 29* 21*  CREATININE 1.65* 1.39* 1.30* 1.26* 1.16 1.32* 1.35* 1.27*  CALCIUM 8.6* 8.5* 8.2* 8.2* 8.6* 8.6* 8.7* 8.4*  MG 2.4 2.6* 2.5* 2.6* 2.7*  --   --   --   PHOS 3.1  --   --   --   --   --   --   --    GFR: Estimated Creatinine Clearance: 52.4 mL/min (A) (by C-G formula based on SCr of 1.27 mg/dL (H)). Recent Labs  Lab 06/17/19 1643 06/20/19 0529 06/21/19 0500  06/22/19 0426 06/23/19 0857  PROCALCITON 0.35  --   --   --   --   WBC  --  5.9 5.3 8.0 8.6    Liver Function Tests: Recent Labs  Lab 06/17/19 1240 06/18/19 0510 06/19/19 0356 06/20/19 0529 06/23/19 0355  AST 23 40 29 46* 36  ALT 16 23 17 29 31   ALKPHOS 66 61 49 54 60  BILITOT 0.6 1.4* 0.5 0.4 0.9  PROT 7.2 6.8 6.7 6.1* 6.2*  ALBUMIN 3.4* 3.0* 2.6* 2.4* 2.5*   No results for input(s): LIPASE, AMYLASE in the last 168 hours. No results for input(s): AMMONIA in the last 168 hours.  ABG    Component Value Date/Time   PHART 7.338 (L) 08/25/2017 1125   PCO2ART 44.4 08/25/2017  1125   PO2ART 105.0 08/25/2017 1125   HCO3 23.9 08/25/2017 1125   TCO2 26 06/17/2019 1650   ACIDBASEDEF 2.0 08/25/2017 1125   O2SAT 98.0 08/25/2017 1125     Coagulation Profile: Recent Labs  Lab 06/17/19 1240  INR 1.0    Cardiac Enzymes: Recent Labs  Lab 06/17/19 1643 06/19/19 0356  CKTOTAL 224 138    HbA1C: Hemoglobin A1C  Date/Time Value Ref Range Status  04/04/2019 09:45 AM 11.0 (A) 4.0 - 5.6 % Final  02/01/2019 10:00 AM 10.6 (A) 4.0 - 5.6 % Final  07/15/2016 12:00 AM 13.1  Final   Hgb A1c MFr Bld  Date/Time Value Ref Range Status  06/17/2019 04:43 PM 11.9 (H) 4.8 - 5.6 % Final    Comment:    (NOTE) Pre diabetes:          5.7%-6.4% Diabetes:              >6.4% Glycemic control for   <7.0% adults with diabetes   03/09/2018 09:49 AM 11.9 (H) 4.8 - 5.6 % Final    Comment:    (NOTE) Pre diabetes:          5.7%-6.4% Diabetes:              >6.4% Glycemic control for   <7.0% adults with diabetes     CBG: Recent Labs  Lab 06/22/19 1146 06/22/19 1628 06/22/19 2156 06/23/19 0739 06/23/19 0823  GLUCAP 98 313* 105* 65* 163*    Review of Systems: Positives in bold  Gen: Denies fever, chills, weight change, fatigue, night sweats HEENT: Denies blurred vision, double vision, hearing loss, tinnitus, sinus congestion, rhinorrhea, sore throat, neck stiffness, dysphagia PULM:  Denies shortness of breath, cough, sputum production, hemoptysis, wheezing CV: Denies chest pain, edema, orthopnea, paroxysmal nocturnal dyspnea, palpitations GI: Denies abdominal pain, nausea, vomiting, diarrhea, hematochezia, melena, constipation, change in bowel habits, poor appetite GU: Denies dysuria, hematuria, polyuria, oliguria, urethral discharge Endocrine: Denies hot or cold intolerance, polyuria, polyphagia or appetite change Derm: Denies rash, dry skin, scaling or peeling skin change Heme: Denies easy bruising, bleeding, bleeding gums Neuro: Denies headache, numbness, generalized weakness, slurred speech, loss of memory or consciousness   Past Medical History  He,  has a past medical history of Adenoidal hypertrophy, Adenomatous colon polyps (2012), Arthritis, Diabetes mellitus, GERD (gastroesophageal reflux disease), Heart attack ([place]), adenomatous colonic polyps (08/18/2017), Hyperlipidemia, Hypertension, Mild CAD, Nonischemic cardiomyopathy ([place]), Obesity, PAF (paroxysmal atrial fibrillation) ([place]), and Sleep apnea.   Surgical History    Past Surgical History:  Procedure Laterality Date   COLONOSCOPY  05/13/11   9 adenomas   FOREARM SURGERY     MUSCLE BIOPSY     RIGHT/LEFT HEART CATH AND CORONARY ANGIOGRAPHY N/A 08/25/2017   Procedure: RIGHT/LEFT HEART CATH AND CORONARY ANGIOGRAPHY;  Surgeon: [name], MD;  Location: [facility];  Service: Cardiovascular;  Laterality: N/A;     Social History   reports that he has never smoked. He has never used smokeless tobacco. He reports that he does not drink alcohol or use drugs.   Family History   His family history includes Diabetes in his father and mother; Heart attack (age of onset: 99) in his mother; Heart attack (age of onset: 44) in his sister; Heart attack (age of onset: 10) in his father; Heart disease in his father and mother; Hyperlipidemia in his father and mother; Hypertension in his father and  mother. There is no history of Colon cancer, Stomach cancer,  Esophageal cancer, or Rectal cancer.   Allergies No Known Allergies   Home Medications  Prior to Admission medications   Medication Sig Start Date End Date Taking? Authorizing Provider  amitriptyline (ELAVIL) 10 MG tablet Take 1 tablet at bed time for one week and then increase 2 tablets at bed time Patient taking differently: Take 20 mg by mouth at bedtime.  05/14/19  Yes Cameron Sprang, MD  apixaban (ELIQUIS) 5 MG TABS tablet Take 5 mg by mouth 2 (two) times daily.   Yes [provider]  carvedilol (COREG) 6.25 MG tablet Take 1 tablet (6.25 mg total) by mouth 2 (two) times daily with a meal. 09/27/18  Yes Larey Dresser, MD  donepezil (ARICEPT) 10 MG tablet Take 1 tablet (10 mg total) by mouth at bedtime. 09/18/18  Yes Cameron Sprang, MD  Evolocumab (REPATHA) 140 MG/ML SOSY Inject 140 mg into the skin every 14 (fourteen) days.    Yes [provider]  ezetimibe (ZETIA) 10 MG tablet Take 1 tablet (10 mg total) by mouth daily. 04/11/19  Yes [name], MD  glucose blood (CONTOUR TEST) test strip 1 each by Other route 2 (two) times a day. 02/01/19  Yes [name], MD  Insulin Degludec (TRESIBA FLEXTOUCH) 200 UNIT/ML SOPN Inject 220 Units into the skin daily. And pen needles 2/day 06/13/19  Yes [name], MD  Insulin Syringe-Needle U-100 (INSULIN SYRINGE .3CC/29GX1/2") 29G X 1/2" 0.3 ML MISC Inject 1 Syringe 3 (three) times daily as directed. Check blood sugar three times daily. Dx: E11.9 05/10/17  Yes [name], [name], DO  lisinopril (PRINIVIL,ZESTRIL) 10 MG tablet Take 1 tablet (10 mg total) by mouth at bedtime. 09/27/18  Yes Larey Dresser, MD  nitroGLYCERIN (NITROSTAT) 0.4 MG SL tablet Place 1 tablet (0.4 mg total) under the tongue every 5 (five) minutes as needed for chest pain. 10/27/17  Yes [name], DO     Francine Graven, MSN, [facility]

## 2019-06-24 DIAGNOSIS — J9600 Acute respiratory failure, unspecified whether with hypoxia or hypercapnia: Secondary | ICD-10-CM

## 2019-06-24 LAB — COMPREHENSIVE METABOLIC PANEL
ALT: 24 U/L (ref 0–44)
AST: 27 U/L (ref 15–41)
Albumin: 2.5 g/dL — ABNORMAL LOW (ref 3.5–5.0)
Alkaline Phosphatase: 58 U/L (ref 38–126)
Anion gap: 10 (ref 5–15)
BUN: 25 mg/dL — ABNORMAL HIGH (ref 6–20)
CO2: 23 mmol/L (ref 22–32)
Calcium: 8.4 mg/dL — ABNORMAL LOW (ref 8.9–10.3)
Chloride: 100 mmol/L (ref 98–111)
Creatinine, Ser: 1.23 mg/dL (ref 0.61–1.24)
GFR calc Af Amer: >60 mL/min (ref >60)
GFR calc non Af Amer: >60 mL/min (ref >60)
Glucose, Bld: 204 mg/dL — ABNORMAL HIGH (ref 70–99)
Potassium: 4.9 mmol/L (ref 3.5–5.1)
Sodium: 133 mmol/L — ABNORMAL LOW (ref 135–145)
Total Bilirubin: 0.6 mg/dL (ref 0.3–1.2)
Total Protein: 6.3 g/dL — ABNORMAL LOW (ref 6.5–8.1)

## 2019-06-24 LAB — D-DIMER, QUANTITATIVE: D-Dimer, Quant: 1.01 ug/mL-FEU — ABNORMAL HIGH (ref 0.00–0.50)

## 2019-06-24 LAB — PREPARE FRESH FROZEN PLASMA: Unit division: 0

## 2019-06-24 LAB — BPAM FFP
Blood Product Expiration Date: 202012211446
ISSUE DATE / TIME: 202012201548
Unit Type and Rh: 2800

## 2019-06-24 LAB — CBC
HCT: 41.5 % (ref 39.0–52.0)
Hemoglobin: 14.4 g/dL (ref 13.0–17.0)
MCH: 30.2 pg (ref 26.0–34.0)
MCHC: 34.7 g/dL (ref 30.0–36.0)
MCV: 87 fL (ref 80.0–100.0)
Platelets: 288 10*3/uL (ref 150–400)
RBC: 4.77 MIL/uL (ref 4.22–5.81)
RDW: 11.8 % (ref 11.5–15.5)
WBC: 6.1 10*3/uL (ref 4.0–10.5)
nRBC: 0 % (ref 0.0–0.2)

## 2019-06-24 LAB — GLUCOSE, CAPILLARY
Glucose-Capillary: 239 mg/dL — ABNORMAL HIGH (ref 70–99)
Glucose-Capillary: 250 mg/dL — ABNORMAL HIGH (ref 70–99)
Glucose-Capillary: 348 mg/dL — ABNORMAL HIGH (ref 70–99)
Glucose-Capillary: 117 mg/dL — ABNORMAL HIGH (ref 70–99)

## 2019-06-24 LAB — PROCALCITONIN: Procalcitonin: 0.22 ng/mL

## 2019-06-24 LAB — C-REACTIVE PROTEIN: CRP: 21.3 mg/dL — ABNORMAL HIGH (ref <1.0)

## 2019-06-24 MED ORDER — ALBUTEROL SULFATE HFA 108 (90 BASE) MCG/ACT IN AERS
1.0000 | INHALATION_SPRAY | RESPIRATORY_TRACT | Status: DC | PRN
  Filled 2019-06-24: qty 6.7

## 2019-06-24 NOTE — Progress Notes (Addendum)
PROGRESS NOTE    Calvin [name]  YHC:[id number] DOB: Sep 19, 1960 DOA: 06/17/2019 PCP: [name], NP   Brief Narrative: 58 year old with past medical history significant for combined systolic and diastolic heart failure ejection fraction 35%, HTN, DM type 2, a fib on eliquis, obstructive sleep apnea, HLD who presents complaining of right-sided weakness, especially lower extremity.  He was found to have SIRS coronavirus 2 infection.  MRI of the brain was negative for stroke.  MRI of the lumbar spine:Small left foraminal disc protrusion at L4-5, closely approximating and potentially irritating the exiting left L4 nerve root. Mild left greater than right L4 foraminal stenosis related to disc bulge and facet disease.  Patient develops progressive respiratory failure, now requiring 15 L oxygen, CRP increase to 20, will Proceed with Actemra and Convalescent plasma.   Assessment & Plan:   Principal Problem:   Right sided weakness Active Problems:   DM (diabetes mellitus) (HCC)   HLD (hyperlipidemia)   Chronic systolic heart failure (HCC)   Essential hypertension   OSA on CPAP   Onychomycosis of multiple toenails with type 2 diabetes mellitus and peripheral neuropathy (HCC)   MCI (mild cognitive impairment)   Diabetic neuropathy (HCC)   AF (paroxysmal atrial fibrillation) (HCC)   AKI (acute kidney injury) ([place])   COVID-19 virus detected  1--COVID-19 infection: -Patient presented with elevated inflammatory markers, CRP at 18. -He report cough, chest x ray with opacification left bases.  -Continue to monitor inflammatory markers. -on Dexamethasone to BID>  -Started on remdesivir day 5. Will extend course until improved.   -Worsening Hypoxemia 12-18, during episode of chest pain. Chest x ray not significant changes. -Continue with albuterol.  -Received one dose of lasix 12-18.  -Worsening hypoxemia and work of breathing on 12-20, Chest x ray stable PNA, progress right, ABG, CCM  was consulted.  -Received actemra and convalescent plasma 12-20. Still requiring significant amount of Oxygen.  He has been stable on NBM , Plan to transfer to [facility] if bed available.   COVID-19 Labs  Recent Labs    06/21/19 1005 06/22/19 0426 06/23/19 0355 06/24/19 0316  DDIMER 0.55*  --  0.65* 1.01*  CRP  --  9.8* 20.9* 21.3*    Lab Results  Component Value Date   SARSCOV2NAA POSITIVE (A) 06/17/2019   SARSCOV2NAA NOT DETECTED 06/05/2019   [place] NEGATIVE 01/23/2019    3-Chest pain.  Resolved after nitroglycerin.  EKG with mild st elevation only V 2. T wave inversion.  Cardiology consulted. They think is more MSK pain.  Continue with aspirin, lipitor.  He is on eliquis, D dimer not significantly elevated.  Resolved.  Started IV PP.   AKI on chronic kidney disease a stage III AB: Patient creatinine baseline 1.2.  Peak of 1.6 improved with IV fluids. Stable.   -Diabetes type 2: Poorly controlled Hemoglobin A1c 11.9 Continue with Lantus and sliding scale insulin  -peripheral neuropathy: Continue with amitriptyline  Orthostatic hypotension;  Will avoid further fluids due t history of HF>  Encourage oral intake.  Holding lisinopril.  Chronic systolic heart failure: Compensated Received one dose of lasix 12-18.    Hyperlipidemia: Continue with Zetia  Right lower extremity weakness: -MRI of brain negative. -MRI of lumbar spine:Small left foraminal disc protrusion at L4-5, closely approximating and potentially irritating the exiting left L4 nerve root. Mild left greater than right L4 foraminal stenosis related to disc bulge and facet disease. Prior episode of the same. -On  steroids. -MRI thoracic spine ordered.  Showed; Multifocal disc protrusions involving the T3-4 through T9-10 interspaces as above, most prominent of which is positioned at the T7-8 level. No significant spinal stenosis. Findings could contribute to underlying back pain. Discussed with PA,  from neurosurgery, MRI finding does not explain weakness. Nothing that will need surgery. Patient needs to follow up with PCP.  Hypertension: Continue with Coreg.  Holding lisinopril due to AKI. Mild elevation of troponin: Suspect related to acute illness. chest pain-free Hyperkalemia; resolved with fluids.  Estimated body mass index is 25.88 kg/m as calculated from the following:   Height as of 05/27/19: 5' 4"  (1.626 m).   Weight as of this encounter: 68.4 kg.   DVT prophylaxis: on eliquis.  Code Status: full code Family Communication: Care discussed with Patient. Sister updated 12-20 Disposition Plan: Remain I the hospital for treatment of Covid 19 infection, AKI, hypoxemia, and tachypnea.  Consultants:   Neurosurgery   Procedures:   none  Antimicrobials:    Subjective: desat while eating. Oxygen improved on Prone position.  Denies chest pain. Denies worsening dyspnea.    Objective: Vitals:   06/24/19 0447 06/24/19 0508 06/24/19 0752 06/24/19 0840  BP:  118/67 108/68   Pulse: 77 74 83   Resp: (!) 24 20 16    Temp:  98.7 F (37.1 C) 98.2 F (36.8 C)   TempSrc:  Oral Oral   SpO2: 94% 94% 94% 97%  Weight:  68.4 kg      Intake/Output Summary (Last 24 hours) at 06/24/2019 0849 Last data filed at 06/23/2019 1943 Gross per 24 hour  Intake 646 ml  Output --  Net 646 ml   Filed Weights   06/22/19 0519 06/23/19 0549 06/24/19 0508  Weight: 69.9 kg 67.7 kg 68.4 kg    Examination:  General exam:  NAD, prone position Respiratory system:  Bilateral air movement, no tachypnea.  Cardiovascular system: S 1, S 2 RRR Gastrointestinal system: BS present, soft, nt Central nervous system; Alert, follows command Extremities: no edema Skin: no rashes    Data Reviewed: I have personally reviewed following labs and imaging studies  CBC: Recent Labs  Lab 06/17/19 1240 06/17/19 1643 06/20/19 0529 06/21/19 0500 06/22/19 0426 06/23/19 0857 06/24/19 0316  WBC 5.4   <  > 5.9 5.3 8.0 8.6 6.1  NEUTROABS 3.9  --   --   --   --   --   --   HGB 16.3   < > 14.6 16.0 16.8 14.6 14.4  HCT 47.6   < > 41.7 47.0 49.0 42.1 41.5  MCV 89.1   < > 86.5 89.5 87.7 87.0 87.0  PLT 142*   < > 146* 172 228 264 288   < > = values in this interval not displayed.   Basic Metabolic Panel: Recent Labs  Lab 06/17/19 1240 06/18/19 0510 06/19/19 0356 06/20/19 0529 06/21/19 0500 06/21/19 1005 06/22/19 0426 06/23/19 0355 06/24/19 0316  NA 133* 134* 134* 133* 136 136 136 136 133*  K 4.0 5.3* 4.4 4.2 5.2* 4.4 4.3 4.1 4.9  CL 97* 100 101 101 103 101 101 100 100  CO2 22 22 20* 21* 17* 23 24 26 23   GLUCOSE 254* 176* 213* 134* 119* 198* 147* 67* 204*  BUN 21* 23* 23* 23* 27* 26* 29* 21* 25*  CREATININE 1.65* 1.39* 1.30* 1.26* 1.16 1.32* 1.35* 1.27* 1.23  CALCIUM 8.6* 8.5* 8.2* 8.2* 8.6* 8.6* 8.7* 8.4* 8.4*  MG 2.4 2.6* 2.5* 2.6* 2.7*  --   --   --   --  PHOS 3.1  --   --   --   --   --   --   --   --    GFR: Estimated Creatinine Clearance: 54.1 mL/min (by C-G formula based on SCr of 1.23 mg/dL). Liver Function Tests: Recent Labs  Lab 06/18/19 0510 06/19/19 0356 06/20/19 0529 06/23/19 0355 06/24/19 0316  AST 40 29 46* 36 27  ALT 23 17 29 31 24   ALKPHOS 61 49 54 60 58  BILITOT 1.4* 0.5 0.4 0.9 0.6  PROT 6.8 6.7 6.1* 6.2* 6.3*  ALBUMIN 3.0* 2.6* 2.4* 2.5* 2.5*   No results for input(s): LIPASE, AMYLASE in the last 168 hours. No results for input(s): AMMONIA in the last 168 hours. Coagulation Profile: Recent Labs  Lab 06/17/19 1240  INR 1.0   Cardiac Enzymes: Recent Labs  Lab 06/17/19 1643 06/19/19 0356  CKTOTAL 224 138   BNP (last 3 results) No results for input(s): PROBNP in the last 8760 hours. HbA1C: No results for input(s): HGBA1C in the last 72 hours. CBG: Recent Labs  Lab 06/23/19 0823 06/23/19 1152 06/23/19 1613 06/23/19 2125 06/24/19 0755  GLUCAP 163* 157* 286* 227* 239*   Lipid Profile: No results for input(s): CHOL, HDL, LDLCALC,  TRIG, CHOLHDL, LDLDIRECT in the last 72 hours. Thyroid Function Tests: No results for input(s): TSH, T4TOTAL, FREET4, T3FREE, THYROIDAB in the last 72 hours. Anemia Panel: No results for input(s): VITAMINB12, FOLATE, FERRITIN, TIBC, IRON, RETICCTPCT in the last 72 hours. Sepsis Labs: Recent Labs  Lab 06/17/19 1643 06/23/19 0857 06/24/19 0316  PROCALCITON 0.35 0.21 0.22    Recent Results (from the past 240 hour(s))  SARS CORONAVIRUS 2 (TAT 6-24 HRS) Nasopharyngeal Nasopharyngeal Swab     Status: Abnormal   Collection Time: 06/17/19  4:20 PM   Specimen: Nasopharyngeal Swab  Result Value Ref Range Status   SARS Coronavirus 2 POSITIVE (A) NEGATIVE Final    Comment: RESULT CALLED TO, READ BACK BY AND VERIFIED WITH: RN MONIQUE DAVIS AT [facility] ON 06/17/2019 (NOTE) SARS-CoV-2 target nucleic acids are DETECTED. The SARS-CoV-2 RNA is generally detectable in upper and lower respiratory specimens during the acute phase of infection. Positive results are indicative of the presence of SARS-CoV-2 RNA. Clinical correlation with patient history and other diagnostic information is  necessary to determine patient infection status. Positive results do not rule out bacterial infection or co-infection with other viruses.  The expected result is Negative. Fact Sheet for Patients: SugarRoll.be Fact Sheet for Healthcare Providers: https://www.woods-mathews.com/ This test is not yet approved or cleared by the [place] FDA and  has been authorized for detection and/or diagnosis of SARS-CoV-2 by FDA under an Emergency Use Authorization (EUA). This EUA will remain  in effect (meaning this t est can be used) for the duration of the COVID-19 declaration under Section 564(b)(1) of the Act, 21 U.S.C. section 360bbb-3(b)(1), unless the authorization is terminated or revoked sooner. Performed at [facility], [place] [address],  [place], [place] [address]          Radiology Studies: DG CHEST PORT 1 VIEW  Result Date: 06/23/2019 CLINICAL DATA:  COVID-19 EXAM: PORTABLE CHEST 1 VIEW COMPARISON:  Two days ago FINDINGS: Similar extent of bilateral pulmonary infiltrate that is greater on the left. There may be mild progression at the right base. Normal heart size. No effusion or pneumothorax. IMPRESSION: Bilateral pneumonia which is stable or minimally progressed on the right. Electronically Signed   By: Neva Seat.D.  On: 06/23/2019 08:59        Scheduled Meds:  albuterol  1-2 puff Inhalation Q6H   amitriptyline  20 mg Oral QHS   apixaban  5 mg Oral BID   vitamin C  500 mg Oral Daily   aspirin EC  81 mg Oral Daily   atorvastatin  40 mg Oral q1800   carvedilol  6.25 mg Oral BID WC   dexamethasone (DECADRON) injection  6 mg Intravenous Q12H   docusate sodium  100 mg Oral BID   ezetimibe  10 mg Oral Daily   feeding supplement (GLUCERNA SHAKE)  237 mL Oral BID BM   insulin aspart  0-15 Units Subcutaneous TID WC   insulin aspart  0-5 Units Subcutaneous QHS   insulin aspart  3 Units Subcutaneous TID WC   insulin glargine  20 Units Subcutaneous QHS   pantoprazole (PROTONIX) IV  40 mg Intravenous Q12H   polyethylene glycol  17 g Oral Daily   senna-docusate  2 tablet Oral QHS   Vitamin D (Ergocalciferol)  50,000 Units Oral Q7 days   zinc sulfate  220 mg Oral Daily   Continuous Infusions:    LOS: 5 days    Time spent: 35 minutes.     Elmarie Shiley, MD [facility]   If 7PM-7AM, please contact night-coverage [email or web address] Password [facility] 06/24/2019, 8:49 AM

## 2019-06-24 NOTE — Progress Notes (Signed)
PT Cancellation Note  Patient Details Name: Calvin [name] MRN: [id number] DOB: 1960-12-20   Cancelled Treatment:    Reason Eval/Treat Not Completed: (P) Medical issues which prohibited therapy Pt proned on 15L HFNC, with difficulty maintaining SaO2 >90% with eating breakfast. RN request follow back for treatment tomorrow.  Rakwon Letourneau B. Migdalia Dk PT, DPT Acute Rehabilitation Services Pager [phone] Office [phone]    [place] 06/24/2019, 10:20 AM

## 2019-06-24 NOTE — Consult Note (Signed)
NAME:  Calvin [name], MRN:  [id number], DOB:  [birth year], LOS: 5 ADMISSION DATE:  06/17/2019, CONSULTATION DATE:  06/23/2019 REFERRING MD:  Hospitalist CHIEF COMPLAINT:  Hypoxia, respiratory distress    Brief History   58 year old gentleman admitted 06/17/2019 with COVID-19 pneumonia and associated hypoxic respiratory failure.  Increasing FiO2 requirements prompted PCCM consultation.  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 fib on Eliquis, OSA, hyperlipidemia admitted 06/1419 diagnosed with COVID-19 pneumonia.  He originally presented with right-sided weakness, generalized fatigue, nonspecific chest pain and shortness of breath.  Cardiology was consulted for chest pain which was thought to be musculoskeletal in nature as he had nonobstructive coronary artery disease by cath in 2019 with EF 35 to 40%.  Started on course of remdesivir and Decadron.  He has required escalating FiO2 with associated difficulty breathing which improved and self proning position and nonrebreather mask.  PCCM consulted due to increasing FiO2 requirements.  Past Medical History  DM, GERD, CAD, HLD, HTN, non ischemic CM, PAF, OSA  Significant Hospital Events   12/14 Admitted   Consults:  Cardiology  Procedures:    Significant Diagnostic Tests:  12/14 CT Head >> atherosclerotic calcification of the major vessels at the base of the brain  12/17 MRI T spine >> multifocal disc protrusions T3-4 through T9-10  Micro Data:  12/14 SARS Coronavirus 2 POSITIVE   Antimicrobials/COVID19 treatments  Remdesivir 12/16 >> 12/20 Decadron 12/15 >>  Interim history/subjective:  Breathing okay at rest.  Winded with any movement.  Not having chest pain or sputum.  Objective   Blood pressure 125/79, pulse 86, temperature 97.8 F (36.6 C), temperature source Axillary, resp. rate 20, weight 68.4  kg, SpO2 94 %.    FiO2 (%):  [100 %] 100 %   Intake/Output Summary (Last 24 hours) at 06/24/2019 1411 Last data filed at 06/24/2019 1100 Gross per 24 hour  Intake 506 ml  Output 600 ml  Net -94 ml   Filed Weights   06/22/19 0519 06/23/19 0549 06/24/19 0508  Weight: 69.9 kg 67.7 kg 68.4 kg    Examination:  General - alert Eyes - pupils reactive ENT - no sinus tenderness, no stridor Cardiac - regular rate/rhythm, no murmur Chest - scattered rhonchi Abdomen - soft, non tender, + bowel sounds Extremities - no cyanosis, clubbing, or edema Skin - no rashes Neuro - follows commands Psych - normal mood and behavior   Resolved Hospital Problem list     Assessment & Plan:   Acute hypoxic respiratory failure from COVID 19 pneumonia. - completed remdesivir - day 7/10 of decadron - goal SpO2 85 to 95% - prone position as able - bronchial hygiene - zinc, Vit C - f/u CXR intermittently  Hx of CAD, non ischemic CM, PAF, HTN, HLD. - continue eliquis, ASA, lipitor, coreg, zetia  DM type II with steroid induced hyperglycemia. - SSI with lantus  Rt leg weakness. Peripheral neuropathy. - per primary team   Best practice:    DVT prophylaxis: eliquis SUP: protonix Nutrition: carb modified/heart healthy Goals of care: full code Disposition: progressive care  Labs    CMP Latest Ref Rng & Units 06/24/2019 06/23/2019 06/22/2019  Glucose 70 - 99 mg/dL 204(H) 67(L) 147(H)  BUN 6 - 20 mg/dL 25(H) 21(H) 29(H)  Creatinine 0.61 - 1.24 mg/dL 1.23 1.27(H) 1.35(H)  Sodium 135 - 145 mmol/L 133(L) 136 136  Potassium 3.5 - 5.1 mmol/L 4.9  4.1 4.3  Chloride 98 - 111 mmol/L 100 100 101  CO2 22 - 32 mmol/L 23 26 24   Calcium 8.9 - 10.3 mg/dL 8.4(L) 8.4(L) 8.7(L)  Total Protein 6.5 - 8.1 g/dL 6.3(L) 6.2(L) -  Total Bilirubin 0.3 - 1.2 mg/dL 0.6 0.9 -  Alkaline Phos 38 - 126 U/L 58 60 -  AST 15 - 41 U/L 27 36 -  ALT 0 - 44 U/L 24 31 -    CBC Latest Ref Rng & Units 06/24/2019  06/23/2019 06/22/2019  WBC 4.0 - 10.5 K/uL 6.1 8.6 8.0  Hemoglobin 13.0 - 17.0 g/dL 14.4 14.6 16.8  Hematocrit 39.0 - 52.0 % 41.5 42.1 49.0  Platelets 150 - 400 K/uL 288 264 228    ABG    Component Value Date/Time   PHART 7.470 (H) 06/23/2019 0920   PCO2ART 30.3 (L) 06/23/2019 0920   PO2ART 204 (H) 06/23/2019 0920   HCO3 21.8 06/23/2019 0920   TCO2 26 06/17/2019 1650   ACIDBASEDEF 1.4 06/23/2019 0920   O2SAT 99.0 06/23/2019 0920    CBG (last 3)  Recent Labs    06/23/19 2125 06/24/19 0755 06/24/19 1202  GLUCAP 227* 78* 250*    Chesley Mires, MD [facility] Pulmonary/Critical Care 06/24/2019, 2:21 PM

## 2019-06-24 NOTE — Progress Notes (Signed)
OT Cancellation Note  Patient Details Name: Sharmarke Cicio MRN: [id number] DOB: 02-08-1961   Cancelled Treatment:    Reason Eval/Treat Not Completed: Medical issues which prohibited therapy.  Pt with increased 02 needs.  He is now proned on 15L HFNC with difficulty maintaining sats.  RN requests OT hold off today.  Will reattempt.  Nilsa Nutting., OTR/L Acute Rehabilitation Services Pager [phone] Office [phone]   Lucille Passy M 06/24/2019, 12:10 PM

## 2019-06-24 NOTE — Progress Notes (Signed)
Patient sats where found in the low 80's while trying to eat, attempted to let him rest and recover sats cont low. Prone pt &  patient responded, Notified MD

## 2019-06-24 NOTE — Progress Notes (Addendum)
Patient transition to a non-rebreather and continues to tolerate prone position. Sats are 98% will continue to monitor, liquids taken, Sister Lelan Pons updated.

## 2019-06-25 ENCOUNTER — Encounter (HOSPITAL_COMMUNITY): Payer: Self-pay | Admitting: Internal Medicine

## 2019-06-25 ENCOUNTER — Inpatient Hospital Stay (HOSPITAL_COMMUNITY): Payer: Medicare HMO

## 2019-06-25 ENCOUNTER — Ambulatory Visit: Payer: Self-pay | Admitting: Pharmacist

## 2019-06-25 ENCOUNTER — Telehealth: Payer: Self-pay | Admitting: Cardiovascular Disease

## 2019-06-25 LAB — CBC
HCT: 43.3 % (ref 39.0–52.0)
HCT: 43 % (ref 39.0–52.0)
Hemoglobin: 14.9 g/dL (ref 13.0–17.0)
Hemoglobin: 14.8 g/dL (ref 13.0–17.0)
MCH: 30.3 pg (ref 26.0–34.0)
MCH: 30.2 pg (ref 26.0–34.0)
MCHC: 34.4 g/dL (ref 30.0–36.0)
MCHC: 34.4 g/dL (ref 30.0–36.0)
MCV: 88 fL (ref 80.0–100.0)
MCV: 87.8 fL (ref 80.0–100.0)
Platelets: 377 10*3/uL (ref 150–400)
Platelets: 370 10*3/uL (ref 150–400)
RBC: 4.92 MIL/uL (ref 4.22–5.81)
RBC: 4.9 MIL/uL (ref 4.22–5.81)
RDW: 11.8 % (ref 11.5–15.5)
RDW: 11.9 % (ref 11.5–15.5)
WBC: 13.1 10*3/uL — ABNORMAL HIGH (ref 4.0–10.5)
WBC: 13.1 10*3/uL — ABNORMAL HIGH (ref 4.0–10.5)
nRBC: 0 % (ref 0.0–0.2)
nRBC: 0 % (ref 0.0–0.2)

## 2019-06-25 LAB — GLUCOSE, CAPILLARY
Glucose-Capillary: 215 mg/dL — ABNORMAL HIGH (ref 70–99)
Glucose-Capillary: 187 mg/dL — ABNORMAL HIGH (ref 70–99)
Glucose-Capillary: 421 mg/dL — ABNORMAL HIGH (ref 70–99)
Glucose-Capillary: 293 mg/dL — ABNORMAL HIGH (ref 70–99)
Glucose-Capillary: 247 mg/dL — ABNORMAL HIGH (ref 70–99)

## 2019-06-25 LAB — BASIC METABOLIC PANEL
Anion gap: 12 (ref 5–15)
Anion gap: 11 (ref 5–15)
BUN: 30 mg/dL — ABNORMAL HIGH (ref 6–20)
BUN: 33 mg/dL — ABNORMAL HIGH (ref 6–20)
CO2: 24 mmol/L (ref 22–32)
CO2: 23 mmol/L (ref 22–32)
Calcium: 9 mg/dL (ref 8.9–10.3)
Calcium: 8.6 mg/dL — ABNORMAL LOW (ref 8.9–10.3)
Chloride: 99 mmol/L (ref 98–111)
Chloride: 99 mmol/L (ref 98–111)
Creatinine, Ser: 1.36 mg/dL — ABNORMAL HIGH (ref 0.61–1.24)
Creatinine, Ser: 1.22 mg/dL (ref 0.61–1.24)
GFR calc Af Amer: >60 mL/min (ref >60)
GFR calc Af Amer: >60 mL/min (ref >60)
GFR calc non Af Amer: 57 mL/min — ABNORMAL LOW (ref >60)
GFR calc non Af Amer: >60 mL/min (ref >60)
Glucose, Bld: 157 mg/dL — ABNORMAL HIGH (ref 70–99)
Glucose, Bld: 187 mg/dL — ABNORMAL HIGH (ref 70–99)
Potassium: 5 mmol/L (ref 3.5–5.1)
Potassium: 4.6 mmol/L (ref 3.5–5.1)
Sodium: 135 mmol/L (ref 135–145)
Sodium: 133 mmol/L — ABNORMAL LOW (ref 135–145)

## 2019-06-25 LAB — SAMPLE TO BLOOD BANK

## 2019-06-25 LAB — C-REACTIVE PROTEIN
CRP: 11.3 mg/dL — ABNORMAL HIGH (ref <1.0)
CRP: 9.4 mg/dL — ABNORMAL HIGH (ref <1.0)

## 2019-06-25 LAB — PROCALCITONIN
Procalcitonin: 0.11 ng/mL
Procalcitonin: <0.1 ng/mL

## 2019-06-25 LAB — BRAIN NATRIURETIC PEPTIDE: B Natriuretic Peptide: 59.8 pg/mL (ref 0.0–100.0)

## 2019-06-25 MED ORDER — FUROSEMIDE 10 MG/ML IJ SOLN: 20.0000 mg | Freq: Once | INTRAMUSCULAR | Status: DC

## 2019-06-25 MED ORDER — FUROSEMIDE 10 MG/ML IJ SOLN
60.0000 mg | Freq: Once | INTRAMUSCULAR | Status: AC
  Administered 2019-06-25: 60 mg via INTRAVENOUS
  Filled 2019-06-25: qty 6

## 2019-06-25 MED ORDER — DEXAMETHASONE SODIUM PHOSPHATE 4 MG/ML IJ SOLN
4.0000 mg | INTRAMUSCULAR | Status: DC
  Administered 2019-06-25 – 2019-06-27 (×3): 4 mg via INTRAVENOUS
  Filled 2019-06-25 (×3): qty 1

## 2019-06-25 MED ORDER — DEXAMETHASONE SODIUM PHOSPHATE 4 MG/ML IJ SOLN: 4.0000 mg | Freq: Two times a day (BID) | INTRAMUSCULAR | Status: DC

## 2019-06-25 MED ORDER — INSULIN GLARGINE 100 UNIT/ML ~~LOC~~ SOLN
30.0000 [IU] | Freq: Every day | SUBCUTANEOUS | Status: DC
  Administered 2019-06-25: 30 [IU] via SUBCUTANEOUS
  Filled 2019-06-25: qty 0.3

## 2019-06-25 MED ORDER — SODIUM POLYSTYRENE SULFONATE 15 GM/60ML PO SUSP
15.0000 g | Freq: Once | ORAL | Status: AC
  Administered 2019-06-25: 15 g via ORAL
  Filled 2019-06-25: qty 60

## 2019-06-25 MED ORDER — INSULIN ASPART 100 UNIT/ML ~~LOC~~ SOLN
6.0000 [IU] | Freq: Three times a day (TID) | SUBCUTANEOUS | Status: DC
  Administered 2019-06-26: 6 [IU] via SUBCUTANEOUS

## 2019-06-25 MED ORDER — PANTOPRAZOLE SODIUM 40 MG PO TBEC
40.0000 mg | DELAYED_RELEASE_TABLET | Freq: Every day | ORAL | Status: DC
  Administered 2019-06-25 – 2019-07-01 (×7): 40 mg via ORAL
  Filled 2019-06-25 (×7): qty 1

## 2019-06-25 MED ORDER — INSULIN ASPART 100 UNIT/ML ~~LOC~~ SOLN
30.0000 [IU] | Freq: Once | SUBCUTANEOUS | Status: AC
  Administered 2019-06-25: 30 [IU] via SUBCUTANEOUS

## 2019-06-25 NOTE — Telephone Encounter (Signed)
    I left a message for pt to call and make an appointment with one of Dr Valeria Batman  APP for 3 weeks Post hospital Follow UP appointment.

## 2019-06-25 NOTE — Progress Notes (Addendum)
This Rn put the patient on 4 liters nasal canula and patient rapidly desated to 80%. Placed the patient back on the non rebreather.  Patient inow out of bed and sitting in the chair. Patient put on 8 liters high flow oxygen and SAO2 80% respirations 40 . Patient complained of light headedness and dizziness. Patient placed on 18 liters high flow Nasal Canula . Sao2 88-89%. Respirations 24.

## 2019-06-25 NOTE — Progress Notes (Signed)
Physical Therapy Treatment Patient Details Name: Calvin [name] MRN: [id number] DOB: Jan 11, 1961 Today's Date: 06/25/2019    History of Present Illness 58 y.o. 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 fib on Eliquis, obstructive sleep apnea, HLD came to the hospital with complaints of right-sided weakness of upper and lower extremity. CT negative.   Positive for COVID,Acute Hypoxic Resp. Failure due to Acute Covid 19 Viral Pneumonitis    PT Comments    The patient noted to be restless in bed, using urinal. Patient is on 12 L HFNC, SPO2 dropping to 83% , RR high 30's for stand and pivot transfer to recliner. Patient is extremely weak and compromised for Respiratory status. Continue to encourage patient  To mobilize  As tolerated.    Follow Up Recommendations  Home health PT;Supervision for mobility/OOB     Equipment Recommendations  None recommended by PT    Recommendations for Other Services       Precautions / Restrictions Precautions Precaution Comments: watch vitals, desats    Mobility  Bed Mobility Overal bed mobility: Needs Assistance Bed Mobility: Supine to Sit     Supine to sit: Min assist     General bed mobility comments: gentle min assist for trunk to sit upright  Transfers Overall transfer level: Needs assistance Equipment used: 1 person hand held assist Transfers: Sit to/from Stand;Stand Pivot Transfers Sit to Stand: Min assist Stand pivot transfers: Min assist       General transfer comment: steady assist to rise from  and pivot to recliner  Ambulation/Gait             General Gait Details: nt   Stairs             Wheelchair Mobility    Modified Rankin (Stroke Patients Only)       Balance Overall balance assessment: Independent Sitting-balance support: Feet supported Sitting balance-Leahy Scale: Fair Sitting balance - Comments:  minguard for safety and pt often relying on UE support   Standing balance support: Single extremity supported;During functional activity Standing balance-Leahy Scale: Poor                              Cognition Arousal/Alertness: Awake/alert Behavior During Therapy: Restless;Flat affect;Anxious                                          Exercises      General Comments        Pertinent Vitals/Pain Faces Pain Scale: Hurts little more Pain Location: chest Pain Descriptors / Indicators: Aching;Grimacing;Discomfort Pain Intervention(s): Monitored during session    Home Living                      Prior Function            PT Goals (current goals can now be found in the care plan section) Progress towards PT goals: Progressing toward goals    Frequency    Min 3X/week      PT Plan Current plan remains appropriate    Co-evaluation              AM-PAC PT "6 Clicks" Mobility   Outcome Measure  Help needed turning from your back to your side while in a flat bed without  using bedrails?: A Little Help needed moving from lying on your back to sitting on the side of a flat bed without using bedrails?: A Little Help needed moving to and from a bed to a chair (including a wheelchair)?: A Little Help needed standing up from a chair using your arms (e.g., wheelchair or bedside chair)?: A Little Help needed to walk in hospital room?: A Lot Help needed climbing 3-5 steps with a railing? : Total 6 Click Score: 15    End of Session Equipment Utilized During Treatment: Oxygen Activity Tolerance: Treatment limited secondary to medical complications (Comment) Patient left: in chair;with call bell/phone within reach Nurse Communication: Mobility status PT Visit Diagnosis: Other abnormalities of gait and mobility (R26.89);History of falling (Z91.81)     Time: 9509-3267 PT Time Calculation (min) (ACUTE ONLY): 17 min  Charges:   $Therapeutic Activity: 8-22 mins                     Tresa Endo PT Acute Rehabilitation Services Pager [phone] Office [phone]    Claretha Cooper 06/25/2019, 4:52 PM

## 2019-06-25 NOTE — Progress Notes (Signed)
Inpatient Diabetes Program Recommendations  AACE/ADA: New Consensus Statement on Inpatient Glycemic Control (2015)  Target Ranges:  Prepandial:   less than 140 mg/dL      Peak postprandial:   less than 180 mg/dL (1-2 hours)      Critically ill patients:  140 - 180 mg/dL   Results for GAMALIEL, CHARNEY (MRN [id number]) as of 06/25/2019 08:56  Ref. Range 06/24/2019 07:55 06/24/2019 12:02 06/24/2019 15:24 06/24/2019 22:10  Glucose-Capillary Latest Ref Range: 70 - 99 mg/dL 239 (H)  5 units NOVOLOG  250 (H)  8 units NOVOLOG  348 (H)  14 units NOVOLOG  117 (H)    20 units LANTUS   Results for MAHMOOD, BOEHRINGER (MRN [id number]) as of 06/25/2019 08:56  Ref. Range 06/25/2019 07:22  Glucose-Capillary Latest Ref Range: 70 - 99 mg/dL 215 (H)    Home DM Meds: Tresiba 220 units Daily   Current Orders: Lantus 20 units at bedtime      Novolog Moderate Correction Scale/ SSI (0-15 units) TID AC + HS      Novolog 3 units TID with meals     Decadron reduced to 4 mg BID today.  AM CBG remains >200 mg/dl.  Afternoon CBGs elevated yesterday as well.    MD- Please consider the following in-hospital insulin adjustments:  1. Increase Lantus slightly to 22 units at bedtime  2. Increase Novolog Meal Coverage to 6 units TID with meals    --Will follow patient during hospitalization--  Wyn Quaker RN, MSN, CDE Diabetes Coordinator Inpatient Glycemic Control Team Team Pager: [phone] (8a-5p)

## 2019-06-25 NOTE — Progress Notes (Addendum)
PROGRESS NOTE                                                                                                                                                                                                             Patient Demographics:    Calvin [name], is a 58 y.o. male, DOB - [birth year], LTJ:[id number]  Outpatient Primary MD for the patient is [name], NP    LOS - 6  Admit date - 06/17/2019    Chief Complaint  Patient presents with  . 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  He was found to have SIRS coronavirus 2 infection, he developed some hypoxic respiratory failure was treated with steroids, remdesivir, convalescent plasma and Actemra at [facility].  He was also evaluated by neurosurgery.  Subsequently sent to [facility] on 06/25/2019.   Subjective:    Calvin [name] today has, No headache, No chest pain, No abdominal pain - No Nausea, No new weakness tingling or numbness, mild SOB, improved R leg weakness and pain.   Assessment  & Plan :     1. Acute Hypoxic Resp. Failure due to Acute Covid 19 Viral Pneumonitis during the ongoing 2020 Covid 19 Pandemic - he has moderate disease he has received appropriate treatment with steroids, remdesivir, convalescent plasma and Actemra at [facility].  He I also think he has some element of CHF, will continue steroids and start to diurese.  Increase activity, add I-S and flutter valve.  Titrate down oxygen, he is currently on 15 L but I do not think he requires that much, staff educated.  Encouraged the patient to sit up in chair in the daytime use I-S and flutter valve for pulmonary  toiletry and then prone in bed when at night.  SpO2: 92 % O2 Flow Rate (L/min): 15 L/min FiO2 (%): 100 %  Recent Labs  Lab 06/19/19 0356 06/20/19 0529 06/21/19 0500 06/21/19 1005 06/22/19 0426 06/23/19 0355 06/23/19 0857 06/24/19 0316 06/25/19 0500 06/25/19 0750  CRP 13.6* 13.1* 11.3*  --  9.8* 20.9*  --  21.3* 11.3*  --  DDIMER  --   --   --  0.55*  --  0.65*  --  1.01*  --   --   FERRITIN 368* 405*  --   --   --   --   --   --   --   --   BNP  --   --   --  112.9*  --   --   --   --   --  59.8  PROCALCITON  --   --   --   --   --   --  0.21 0.22 0.11  --     Hepatic Function Latest Ref Rng & Units 06/24/2019 06/23/2019 06/20/2019  Total Protein 6.5 - 8.1 g/dL 6.3(L) 6.2(L) 6.1(L)  Albumin 3.5 - 5.0 g/dL 2.5(L) 2.5(L) 2.4(L)  AST 15 - 41 U/L 27 36 46(H)  ALT 0 - 44 U/L _0 Alk Phosphatase 38 - 126 U/L 58 60 54  Total Bilirubin 0.3 - 1.2 mg/dL 0.6 0.9 0.4  Bilirubin, Direct 0.00 - 0.40 mg/dL - - -    2.  Acute on chronic right-sided lower back pain with radiation to right lower extremity and some right lower extremity weakness acute on chronic- previous MD had discussed the case and his MRI findings with neurosurgery, no further input, supportive care, weakness and pain improved.  Commence PT OT.  3.  Combined acute on chronic systolic and diastolic heart failure.  EF around 40% on echocardiogram done in 01/2019.  Continue Coreg, will add IV Lasix and monitor.  4.  Dyslipidemia.  Continue Zetia.  5.  Essential hypertension.  On Lasix and Coreg, ACE inhibitor on hold as he had developed mild AKI.   6.  Mild AKI on CKD 3.  Baseline creatinine anywhere between 1.15-1.3.  Monitor with diuresis.    Condition - Fair  Family Communication  :   Sister Lelan Pons called on her listed cell phone number on 06/25/2019 at 10:20 AM.  Message left.  She later called back the same day and she was updated.  Called again on 06/25/2019 at 3:58 AM.  Again message left.  Code  Status :  Full  Diet :   Diet Order            Diet heart healthy/carb modified Room service appropriate? Yes; Fluid consistency: Thin  Diet effective now               Disposition Plan  :  TBD  Consults  : Previous MD discussed his case with neurosurgery.  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  PUD Prophylaxis : PPI  DVT Prophylaxis  :  Eliquis  Lab Results  Component Value Date   PLT 377 06/25/2019    Inpatient Medications  Scheduled Meds: . amitriptyline  20 mg Oral QHS  . apixaban  5 mg Oral BID  . vitamin C  500 mg Oral Daily  . aspirin EC  81 mg Oral Daily  . atorvastatin  40 mg Oral q1800  . carvedilol  6.25 mg Oral BID WC  . [START ON 06/26/2019] dexamethasone (DECADRON) injection  4 mg Intravenous Q24H  . docusate sodium  100 mg Oral BID  . ezetimibe  10 mg Oral Daily  . feeding supplement (GLUCERNA SHAKE)  237 mL Oral BID BM  .  furosemide  60 mg Intravenous Once  . insulin aspart  0-15 Units Subcutaneous TID WC  . insulin aspart  0-5 Units Subcutaneous QHS  . insulin aspart  3 Units Subcutaneous TID WC  . insulin glargine  20 Units Subcutaneous QHS  . pantoprazole  40 mg Oral Daily  . polyethylene glycol  17 g Oral Daily  . senna-docusate  2 tablet Oral QHS  . sodium polystyrene  15 g Oral Once  . Vitamin D (Ergocalciferol)  50,000 Units Oral Q7 days  . zinc sulfate  220 mg Oral Daily   Continuous Infusions: PRN Meds:.albuterol, alum & mag hydroxide-simeth, dextromethorphan-guaiFENesin, morphine injection, nitroGLYCERIN, [DISCONTINUED] ondansetron **OR** ondansetron (ZOFRAN) IV, traMADol  Antibiotics  :    Anti-infectives (From admission, onward)   Start     Dose/Rate Route Frequency Ordered Stop   06/23/19 1000  remdesivir 100 mg in sodium chloride 0.9 % 100 mL IVPB     100 mg 200 mL/hr over  30 Minutes Intravenous Daily 06/23/19 0750 06/23/19 0850   06/20/19 1000  remdesivir 100 mg in sodium chloride 0.9 % 100 mL IVPB  Status:  Discontinued     100 mg 200 mL/hr over 30 Minutes Intravenous Daily 06/19/19 1321 06/23/19 0750   06/19/19 1330  remdesivir 200 mg in sodium chloride 0.9% 250 mL IVPB     200 mg 580 mL/hr over 30 Minutes Intravenous Once 06/19/19 1321 06/19/19 1539       Time Spent in minutes  30   Lala Lund M.D on 06/25/2019 at 10:30 AM  To page go to [email or web address] - password [email or web address]  [facility] -  Office  [phone]  See all Orders from today for further details    Objective:   Vitals:   06/25/19 0344 06/25/19 0346 06/25/19 0514 06/25/19 0735  BP:  126/75 122/70 125/73  Pulse:  79 79 80  Resp:  (!) 27 (!) 25 (!) 26  Temp: 98.6 F (37 C)  97.7 F (36.5 C) (!) 97.5 F (36.4 C)  TempSrc: Oral  Oral Axillary  SpO2:  93% 95% 92%  Weight:   69.2 kg   Height:   5' 4" (1.626 m)     Wt Readings from Last 3 Encounters:  06/25/19 69.2 kg  06/19/19 79.4 kg  05/27/19 74.8 kg     Intake/Output Summary (Last 24 hours) at 06/25/2019 1030 Last data filed at 06/25/2019 0600 Gross per 24 hour  Intake 250 ml  Output 750 ml  Net -500 ml     Physical Exam  Awake Alert,  No new F.N deficits, Normal affect [place].AT,PERRAL Supple Neck,No JVD, No cervical lymphadenopathy appriciated.  Symmetrical Chest wall movement, Good air movement bilaterally, few rales RRR,No Gallops,Rubs or new Murmurs, No Parasternal Heave +ve B.Sounds, Abd Soft, No tenderness, No organomegaly appriciated, No rebound - guarding or rigidity. No Cyanosis, Clubbing or edema, No new Rash or bruise       Data Review:    CBC Recent Labs  Lab 06/21/19 0500 06/22/19 0426 06/23/19 0857 06/24/19 0316 06/25/19 0500  WBC 5.3 8.0 8.6 6.1 13.1*  HGB 16.0 16.8 14.6 14.4 14.9  HCT 47.0 49.0 42.1 41.5 43.3  PLT 172 228 264 288 377  MCV 89.5 87.7 87.0 87.0 88.0  MCH 30.5  30.1 30.2 30.2 30.3  MCHC 34.0 34.3 34.7 34.7 34.4  RDW 11.9 11.7 11.7 11.8 11.8    Chemistries  Recent Labs  Lab 06/19/19 0356 06/20/19 0529 06/21/19 0500 06/21/19 1005 06/22/19 0426  06/23/19 0355 06/24/19 0316 06/25/19 0500  NA 134* 133* 136 136 136 136 133* 135  K 4.4 4.2 5.2* 4.4 4.3 4.1 4.9 5.0  CL 101 101 103 101 101 100 100 99  CO2 20* 21* 17* _0 GLUCOSE 213* 134* 119* 198* 147* 67* 204* 157*  BUN 23* 23* 27* 26* 29* 21* 25* 30*  CREATININE 1.30* 1.26* 1.16 1.32* 1.35* 1.27* 1.23 1.36*  CALCIUM 8.2* 8.2* 8.6* 8.6* 8.7* 8.4* 8.4* 9.0  MG 2.5* 2.6* 2.7*  --   --   --   --   --   AST 29 46*  --   --   --  36 27  --   ALT 17 29  --   --   --  31 24  --   ALKPHOS 49 54  --   --   --  60 58  --   BILITOT 0.5 0.4  --   --   --  0.9 0.6  --    ------------------------------------------------------------------------------------------------------------------ No results for input(s): CHOL, HDL, LDLCALC, TRIG, CHOLHDL, LDLDIRECT in the last 72 hours.  Lab Results  Component Value Date   HGBA1C 11.9 (H) 06/17/2019   ------------------------------------------------------------------------------------------------------------------ No results for input(s): TSH, T4TOTAL, T3FREE, THYROIDAB in the last 72 hours.  Invalid input(s): FREET3  Cardiac Enzymes No results for input(s): CKMB, TROPONINI, MYOGLOBIN in the last 168 hours.  Invalid input(s): CK ------------------------------------------------------------------------------------------------------------------    Component Value Date/Time   BNP 59.8 06/25/2019 0750    Micro Results Recent Results (from the past 240 hour(s))  SARS CORONAVIRUS 2 (TAT 6-24 HRS) Nasopharyngeal Nasopharyngeal Swab     Status: Abnormal   Collection Time: 06/17/19  4:20 PM   Specimen: Nasopharyngeal Swab  Result Value Ref Range Status   SARS Coronavirus 2 POSITIVE (A) NEGATIVE Final    Comment: RESULT CALLED TO, READ BACK BY AND  VERIFIED WITH: RN MONIQUE DAVIS AT [facility] ON 06/17/2019 (NOTE) SARS-CoV-2 target nucleic acids are DETECTED. The SARS-CoV-2 RNA is generally detectable in upper and lower respiratory specimens during the acute phase of infection. Positive results are indicative of the presence of SARS-CoV-2 RNA. Clinical correlation with patient history and other diagnostic information is  necessary to determine patient infection status. Positive results do not rule out bacterial infection or co-infection with other viruses.  The expected result is Negative. Fact Sheet for Patients: SugarRoll.be Fact Sheet for Healthcare Providers: https://www.woods-mathews.com/ This test is not yet approved or cleared by the [place] FDA and  has been authorized for detection and/or diagnosis of SARS-CoV-2 by FDA under an Emergency Use Authorization (EUA). This EUA will remain  in effect (meaning this t est can be used) for the duration of the COVID-19 declaration under Section 564(b)(1) of the Act, 21 U.S.C. section 360bbb-3(b)(1), unless the authorization is terminated or revoked sooner. Performed at [facility], [place] [address], [place], [place] [address]     Radiology Reports DG Abd 1 View  Result Date: 06/21/2019 CLINICAL DATA:  Abdominal pain. EXAM: ABDOMEN - 1 VIEW COMPARISON:  Scout image for CT scan of the abdomen dated 01/23/2019 FINDINGS: There is air scattered throughout the nondistended large and small bowel. Stomach is not distended. No abnormal abdominal calcifications. Bones are normal. IMPRESSION: Benign-appearing abdomen. Electronically Signed   By: Lorriane Shire M.D.   On: 06/21/2019 14:19   CT HEAD WO CONTRAST  Result Date: 06/17/2019 CLINICAL DATA:  Right-sided weakness, 2 days  duration. Recent falling. EXAM: CT HEAD WITHOUT CONTRAST TECHNIQUE: Contiguous axial images were obtained from the base of the skull through the  vertex without intravenous contrast. COMPARISON:  MRI 10/14/2017 FINDINGS: Brain: The brain shows a normal appearance without evidence of malformation, atrophy, old or acute small or large vessel infarction, mass lesion, hemorrhage, hydrocephalus or extra-axial collection. Vascular: There is atherosclerotic calcification of the major vessels at the base of the brain. Skull: Normal.  No traumatic finding.  No focal bone lesion. Sinuses/Orbits: Sinuses are clear. Orbits appear normal. Mastoids are clear. Other: None significant IMPRESSION: Normal head CT except for atherosclerotic calcification of the major vessels at the base of the brain. Electronically Signed   By: Nelson Chimes M.D.   On: 06/17/2019 14:41   MR BRAIN W WO CONTRAST  Result Date: 06/17/2019 CLINICAL DATA:  Neuro deficit. Subacute right-sided weakness. EXAM: MRI HEAD WITHOUT AND WITH CONTRAST TECHNIQUE: Multiplanar, multiecho pulse sequences of the brain and surrounding structures were obtained without and with intravenous contrast. CONTRAST:  90m GADAVIST GADOBUTROL 1 MMOL/ML IV SOLN COMPARISON:  CT head 06/17/2019. MRI 10/14/2017 FINDINGS: Brain: No acute infarction, hemorrhage, hydrocephalus, extra-axial collection or mass lesion. No significant chronic ischemia Normal enhancement postcontrast administration Vascular: Normal arterial flow voids Skull and upper cervical spine: No focal skeletal lesion. Sinuses/Orbits: Mild mucosal edema paranasal sinuses. Normal orbit Other: None IMPRESSION: Negative MRI head with contrast. Electronically Signed   By: CFranchot GalloM.D.   On: 06/17/2019 19:01   MR THORACIC SPINE WO CONTRAST  Result Date: 06/20/2019 CLINICAL DATA:  Initial evaluation for mid back pain. EXAM: MRI THORACIC SPINE WITHOUT CONTRAST TECHNIQUE: Multiplanar, multisequence MR imaging of the thoracic spine was performed. No intravenous contrast was administered. COMPARISON:  None available. FINDINGS: Alignment: Vertebral bodies  normally aligned with preservation of the normal thoracic kyphosis. No listhesis or subluxation. Vertebrae: Vertebral body height maintained without evidence for acute or chronic fracture. Bone marrow signal intensity within normal limits. Small benign hemangioma noted within the T9 vertebral body. No worrisome osseous lesions. No abnormal marrow edema. Cord: Signal intensity within the thoracic spinal cord is within normal limits. Paraspinal and other soft tissues: Paraspinal soft tissues demonstrate no acute finding. Patchy multifocal opacities noted within the partially visualized lungs, likely related history of COVID infection. Small T2 hyperintense simple right renal cyst noted. Visualized visceral structures otherwise unremarkable. Disc levels: T1-2:  Unremarkable. T2-3: Unremarkable. T3-4: Tiny left paracentral disc protrusion minimally indents the left ventral thecal sac. No significant stenosis or cord deformity. T4-5:  Mild disc bulge. No significant canal or foraminal stenosis. T5-6: Small central disc protrusion minimally indents the ventral thecal sac. No significant stenosis or cord deformity. T6-7: Central/right paracentral disc protrusion indents the ventral thecal sac. Mild flattening of the ventral spinal cord without cord signal changes. No significant stenosis. T7-8: Central disc protrusion indents the ventral thecal sac, contacting and mildly flattening the ventral thoracic cord. Superimposed prominence of the dorsal epidural fat. No significant spinal stenosis. Foramina remain patent. T8-9: Small central disc protrusion indents the ventral thecal sac. Associated annular fissure. Minimal flattening of the ventral cord without cord signal changes. No significant stenosis. T9-10: Mild disc bulge with disc desiccation. Mild posterior element hypertrophy. No significant stenosis. T10-11:  Unremarkable. T11-12:  Unremarkable. T12-L1:  Unremarkable. IMPRESSION: 1. Multifocal disc protrusions  involving the T3-4 through T9-10 interspaces as above, most prominent of which is positioned at the T7-8 level. No significant spinal stenosis. Findings could contribute to underlying back pain. 2.  Patchy multifocal opacities within the partially visualized lungs, likely related to history of COVID infection. Electronically Signed   By: Jeannine Boga M.D.   On: 06/20/2019 00:04   MR LUMBAR SPINE WO CONTRAST  Result Date: 06/18/2019 CLINICAL DATA:  Initial evaluation for unilateral right lower extremity weakness, tingling in toes. EXAM: MRI LUMBAR SPINE WITHOUT CONTRAST TECHNIQUE: Multiplanar, multisequence MR imaging of the lumbar spine was performed. No intravenous contrast was administered. COMPARISON:  Prior radiograph from 01/18/2012. FINDINGS: Segmentation: Standard. Lowest well-formed disc space labeled the L5-S1 level. Alignment: Physiologic with preservation of the normal lumbar lordosis. No listhesis. Vertebrae: Vertebral body height maintained without evidence for acute or chronic fracture. Bone marrow signal intensity somewhat diffusely decreased on T1 weighted imaging, nonspecific, but most commonly related to anemia, smoking, or obesity. Subcentimeter benign hemangioma noted within the L4 vertebral body. No other discrete or worrisome osseous lesions. No abnormal marrow edema. Conus medullaris and cauda equina: Conus extends to the L1 level. Conus and cauda equina appear normal. Paraspinal and other soft tissues: Paraspinous soft tissues within normal limits. Partially visualized urinary bladder moderately distended. Visualized visceral structures otherwise unremarkable. Disc levels: L1-2: Disc desiccation without significant disc bulge. No canal or neural foraminal stenosis. No impingement. L2-3: Mild disc desiccation without significant disc bulge. No canal or neural foraminal stenosis. L3-4: Disc desiccation with minimal annular disc bulge. No significant canal or neural foraminal  stenosis. No impingement. L4-5: Disc desiccation with minimal annular disc bulge. Superimposed small left foraminal disc protrusion closely approximates the exiting left L4 nerve root without frank neural impingement (series 4, image 11). No significant canal or lateral recess stenosis. Mild left greater than right L4 foraminal stenosis. L5-S1: Negative interspace. Mild epidural lipomatosis. No canal or lateral recess stenosis. Foramina remain patent. No impingement. IMPRESSION: 1. Small left foraminal disc protrusion at L4-5, closely approximating and potentially irritating the exiting left L4 nerve root. 2. Mild left greater than right L4 foraminal stenosis related to disc bulge and facet disease. 3. Additional minor for age degenerative disc disease elsewhere within the lumbar spine without significant stenosis or neural impingement. No other findings to explain patient's right lower extremity symptoms identified. 4. Diffusely decreased T1 marrow signal intensity, nonspecific, but most commonly related to anemia, smoking, or obesity. Correlation with history and laboratory values recommended. Electronically Signed   By: Jeannine Boga M.D.   On: 06/18/2019 21:06   DG Chest Port 1 View  Result Date: 06/25/2019 CLINICAL DATA:  Shortness of breath EXAM: PORTABLE CHEST 1 VIEW COMPARISON:  June 23, 2019 FINDINGS: There is increase in airspace opacity in the left base with small left pleural effusion. There is also airspace opacity in the lateral right base with small right pleural effusion. Heart is mildly enlarged with pulmonary vascularity normal. No adenopathy. There is aortic atherosclerosis. No bone lesions. IMPRESSION: Airspace opacity in both lower lung regions, more on the left on the right with small pleural effusions bilaterally. Heart mildly enlarged. Pulmonary vascularity normal. No adenopathy. Aortic Atherosclerosis (ICD10-I70.0). Electronically Signed   By: Lowella Grip III M.D.   On:  06/25/2019 08:12   DG CHEST PORT 1 VIEW  Result Date: 06/23/2019 CLINICAL DATA:  COVID-19 EXAM: PORTABLE CHEST 1 VIEW COMPARISON:  Two days ago FINDINGS: Similar extent of bilateral pulmonary infiltrate that is greater on the left. There may be mild progression at the right base. Normal heart size. No effusion or pneumothorax. IMPRESSION: Bilateral pneumonia which is stable or minimally progressed on the right. Electronically  Signed   By: Monte Fantasia M.D.   On: 06/23/2019 08:59   DG CHEST PORT 1 VIEW  Result Date: 06/21/2019 CLINICAL DATA:  Hypoxemia and chest pain.  COVID 19 positive. EXAM: PORTABLE CHEST 1 VIEW COMPARISON:  06/19/2019 FINDINGS: Lungs are hypoinflated and demonstrate mild hazy opacification over the left base/retrocardiac region unchanged. No significant effusion. Cardiomediastinal silhouette and remainder of the exam is unchanged. IMPRESSION: Stable hazy opacification over the left base/retrocardiac region. Electronically Signed   By: Marin Olp M.D.   On: 06/21/2019 10:10   DG Chest Port 1 View  Result Date: 06/19/2019 CLINICAL DATA:  Cough. Right-sided weakness. EXAM: PORTABLE CHEST 1 VIEW COMPARISON:  06/17/2019 FINDINGS: There is a new focal area of atelectasis or infiltrate at the left lung base posterior medially. Minimal atelectasis at the right lung base. Lungs are otherwise clear. No effusions. Heart size and pulmonary vascularity are normal. No bone abnormality. IMPRESSION: 1. New focal area of atelectasis or infiltrate at the left lung base posterior medially. 2. Minimal new atelectasis at the right lung base. Electronically Signed   By: Lorriane Shire M.D.   On: 06/19/2019 16:46   DG Chest Port 1 View  Result Date: 06/17/2019 CLINICAL DATA:  Chest pain EXAM: PORTABLE CHEST 1 VIEW COMPARISON:  June 05, 2019 FINDINGS: The heart size and mediastinal contours are within normal limits. Both lungs are clear. The visualized skeletal structures are unremarkable.  IMPRESSION: No active disease. Electronically Signed   By: Prudencio Pair M.D.   On: 06/17/2019 12:12   DG Chest Portable 1 View  Result Date: 06/05/2019 CLINICAL DATA:  Chest pain and shortness of breath. EXAM: PORTABLE CHEST 1 VIEW COMPARISON:  01/23/2019 FINDINGS: The heart size and mediastinal contours are within normal limits. Both lungs are clear. The visualized skeletal structures are unremarkable. IMPRESSION: Normal exam. Electronically Signed   By: Lorriane Shire M.D.   On: 06/05/2019 18:08   DG HIP UNILAT WITH PELVIS 1V RIGHT  Result Date: 06/05/2019 CLINICAL DATA:  Right hip pain EXAM: DG HIP (WITH OR WITHOUT PELVIS) 1V RIGHT COMPARISON:  None. FINDINGS: There is no evidence of hip fracture or dislocation. There is no evidence of arthropathy or other focal bone abnormality. IMPRESSION: Negative. Electronically Signed   By: Rolm Baptise M.D.   On: 06/05/2019 20:36

## 2019-06-25 NOTE — Progress Notes (Addendum)
Patient's blood Sugar is 412  Dr Candiss Norse notified..  Patient's sister updated on the patients status.Family  requesting to speak to Dr Candiss Norse .  This RN notified Dr Candiss Norse Per Dr Candiss Norse of patients blood sugar instructed to give the patient 30 Units Novolog and do not recheck  Until 9 pm tonight.

## 2019-06-25 NOTE — Progress Notes (Signed)
Pt transferred to [facility] via [facility]. Alert/oriented no s/s of acute distress. On 15L NRB.

## 2019-06-26 LAB — CBC WITH DIFFERENTIAL/PLATELET
Abs Immature Granulocytes: 0.07 10*3/uL (ref 0.00–0.07)
Basophils Absolute: 0 10*3/uL (ref 0.0–0.1)
Basophils Relative: 0 %
Eosinophils Absolute: 0.2 10*3/uL (ref 0.0–0.5)
Eosinophils Relative: 3 %
HCT: 49.3 % (ref 39.0–52.0)
Hemoglobin: 16.6 g/dL (ref 13.0–17.0)
Immature Granulocytes: 1 %
Lymphocytes Relative: 15 %
Lymphs Abs: 1.4 10*3/uL (ref 0.7–4.0)
MCH: 29.8 pg (ref 26.0–34.0)
MCHC: 33.7 g/dL (ref 30.0–36.0)
MCV: 88.5 fL (ref 80.0–100.0)
Monocytes Absolute: 0.3 10*3/uL (ref 0.1–1.0)
Monocytes Relative: 3 %
Neutro Abs: 7.1 10*3/uL (ref 1.7–7.7)
Neutrophils Relative %: 78 %
Platelets: 526 10*3/uL — ABNORMAL HIGH (ref 150–400)
RBC: 5.57 MIL/uL (ref 4.22–5.81)
RDW: 11.9 % (ref 11.5–15.5)
WBC: 9.2 10*3/uL (ref 4.0–10.5)
nRBC: 0 % (ref 0.0–0.2)

## 2019-06-26 LAB — COMPREHENSIVE METABOLIC PANEL
ALT: 26 U/L (ref 0–44)
AST: 32 U/L (ref 15–41)
Albumin: 2.8 g/dL — ABNORMAL LOW (ref 3.5–5.0)
Alkaline Phosphatase: 66 U/L (ref 38–126)
Anion gap: 13 (ref 5–15)
BUN: 36 mg/dL — ABNORMAL HIGH (ref 6–20)
CO2: 28 mmol/L (ref 22–32)
Calcium: 8.9 mg/dL (ref 8.9–10.3)
Chloride: 97 mmol/L — ABNORMAL LOW (ref 98–111)
Creatinine, Ser: 1.38 mg/dL — ABNORMAL HIGH (ref 0.61–1.24)
GFR calc Af Amer: >60 mL/min (ref >60)
GFR calc non Af Amer: 56 mL/min — ABNORMAL LOW (ref >60)
Glucose, Bld: 61 mg/dL — ABNORMAL LOW (ref 70–99)
Potassium: 3.9 mmol/L (ref 3.5–5.1)
Sodium: 138 mmol/L (ref 135–145)
Total Bilirubin: 0.5 mg/dL (ref 0.3–1.2)
Total Protein: 6.7 g/dL (ref 6.5–8.1)

## 2019-06-26 LAB — BRAIN NATRIURETIC PEPTIDE: B Natriuretic Peptide: 49.5 pg/mL (ref 0.0–100.0)

## 2019-06-26 LAB — D-DIMER, QUANTITATIVE: D-Dimer, Quant: 1.46 ug/mL-FEU — ABNORMAL HIGH (ref 0.00–0.50)

## 2019-06-26 LAB — GLUCOSE, CAPILLARY
Glucose-Capillary: 70 mg/dL (ref 70–99)
Glucose-Capillary: 214 mg/dL — ABNORMAL HIGH (ref 70–99)
Glucose-Capillary: 224 mg/dL — ABNORMAL HIGH (ref 70–99)
Glucose-Capillary: 207 mg/dL — ABNORMAL HIGH (ref 70–99)
Glucose-Capillary: 286 mg/dL — ABNORMAL HIGH (ref 70–99)
Glucose-Capillary: 349 mg/dL — ABNORMAL HIGH (ref 70–99)

## 2019-06-26 LAB — MAGNESIUM: Magnesium: 2.4 mg/dL (ref 1.7–2.4)

## 2019-06-26 LAB — C-REACTIVE PROTEIN: CRP: 6.1 mg/dL — ABNORMAL HIGH (ref <1.0)

## 2019-06-26 MED ORDER — DEXTROSE 50 % IV SOLN
INTRAVENOUS | Status: AC
  Administered 2019-06-26: 25 mL
  Filled 2019-06-26: qty 50

## 2019-06-26 MED ORDER — POTASSIUM CHLORIDE CRYS ER 20 MEQ PO TBCR
40.0000 meq | EXTENDED_RELEASE_TABLET | Freq: Once | ORAL | Status: AC
  Administered 2019-06-26: 40 meq via ORAL
  Filled 2019-06-26: qty 2

## 2019-06-26 MED ORDER — FUROSEMIDE 10 MG/ML IJ SOLN
40.0000 mg | Freq: Once | INTRAMUSCULAR | Status: AC
  Administered 2019-06-26: 40 mg via INTRAVENOUS
  Filled 2019-06-26: qty 4

## 2019-06-26 MED ORDER — INSULIN ASPART 100 UNIT/ML ~~LOC~~ SOLN
3.0000 [IU] | Freq: Three times a day (TID) | SUBCUTANEOUS | Status: DC
  Administered 2019-06-26 – 2019-06-27 (×3): 3 [IU] via SUBCUTANEOUS

## 2019-06-26 MED ORDER — INSULIN GLARGINE 100 UNIT/ML ~~LOC~~ SOLN
20.0000 [IU] | Freq: Every day | SUBCUTANEOUS | Status: DC
  Administered 2019-06-26: 20 [IU] via SUBCUTANEOUS
  Filled 2019-06-26: qty 0.2

## 2019-06-26 MED ORDER — DEXTROSE 50 % IV SOLN
25.0000 mL | Freq: Once | INTRAVENOUS | Status: AC
  Administered 2019-06-26: 25 mL via INTRAVENOUS

## 2019-06-26 NOTE — Progress Notes (Signed)
PROGRESS NOTE                                                                                                                                                                                                             Patient Demographics:    Calvin [name], is a 58 y.o. male, DOB - February 26, 1961, BLT:[id number]  Outpatient Primary MD for the patient is [name], NP    LOS - 7  Admit date - 06/17/2019    Chief Complaint  Patient presents with  . 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  He was found to have SIRS coronavirus 2 infection, he developed some hypoxic respiratory failure was treated with steroids, remdesivir, convalescent plasma and Actemra at [facility].  He was also evaluated by neurosurgery.  Subsequently sent to [facility] on 06/25/2019.   Subjective:   Patient in bed, denies any headache chest or abdominal pain.  Back and right leg pain and weakness improved.  Shortness of breath improved.  Feels little tired.   Assessment  & Plan :     1. Acute Hypoxic Resp. Failure due to Acute Covid 19 Viral Pneumonitis during the ongoing 2020 Covid 19 Pandemic - he has moderate disease he has received appropriate treatment with steroids, remdesivir, convalescent plasma and Actemra at [facility].  He I also think he has some element of CHF, will continue steroids and start to diurese.  Increase activity, add I-S and flutter valve.  At rest he is requiring about 4 to 5 L nasal cannula oxygen, upon movement his oxygen demand is increase, staff educated.  We will continue to monitor closely.  Inflammatory markers trending down.  Encouraged the  patient to sit up in chair in the daytime use I-S and flutter valve for pulmonary toiletry and then prone in bed when at night.  SpO2: (!) 89 % O2 Flow Rate (L/min): 5 L/min FiO2 (%): 98 %  Recent Labs  Lab 06/20/19 0529 06/21/19 1005 06/23/19 0355 06/23/19 0857 06/24/19 0316 06/25/19 0500 06/25/19 0750 06/26/19 0200  CRP 13.1*  --  20.9*  --  21.3* 11.3* 9.4* 6.1*  DDIMER  --  0.55* 0.65*  --  1.01*  --   --  1.46*  FERRITIN 405*  --   --   --   --   --   --   --   BNP  --  112.9*  --   --   --   --  59.8 49.5  PROCALCITON  --   --   --  0.21 0.22 0.11 <0.10  --     Hepatic Function Latest Ref Rng & Units 06/26/2019 06/24/2019 06/23/2019  Total Protein 6.5 - 8.1 g/dL 6.7 6.3(L) 6.2(L)  Albumin 3.5 - 5.0 g/dL 2.8(L) 2.5(L) 2.5(L)  AST 15 - 41 U/L 32 27 36  ALT 0 - 44 U/L _0 Alk Phosphatase 38 - 126 U/L 66 58 60  Total Bilirubin 0.3 - 1.2 mg/dL 0.5 0.6 0.9  Bilirubin, Direct 0.00 - 0.40 mg/dL - - -    2.  Acute on chronic right-sided lower back pain with radiation to right lower extremity and some right lower extremity weakness acute on chronic- previous MD had discussed the case and his MRI findings with neurosurgery, no further input, supportive care, weakness and pain improved.  Commence PT OT.  3.  Combined acute on chronic systolic and diastolic heart failure.  EF around 40% on echocardiogram done in 01/2019.  Continue Coreg, will add IV Lasix and monitor.  4.  Dyslipidemia.  Continue Zetia.  5.  Essential hypertension.  On Lasix and Coreg, ACE inhibitor on hold as he had developed mild AKI.   6.  Mild AKI on CKD 3.  Baseline creatinine anywhere between 1.15-1.3.  Monitor with diuresis.  7. DM2 -on Lantus, premeal NovoLog and sliding scale.  Was hypoglycemic 06/26/2019 early morning.  Doses adjusted, 1/2 amp of D50 given IV.  Poor outpatient control due to hyperglycemia, DM and insulin education will be provided prior to discharge.  Lab Results  Component  Value Date   HGBA1C 11.9 (H) 06/17/2019   CBG (last 3)  Recent Labs    06/25/19 1934 06/26/19 0750 06/26/19 0954  GLUCAP 247* 70 214*      Condition - Fair  Family Communication  :   Sister Lelan Pons called on her listed cell phone number on 06/25/2019 at 10:20 AM.  Message left.  She later called back the same day and she was updated.  Called again on 06/25/2019 at 3:58 AM.  Again message left.  Code Status :  Full  Diet :   Diet Order            Diet heart healthy/carb modified Room service appropriate? Yes; Fluid consistency: Thin  Diet effective now               Disposition Plan  :  TBD  Consults  : Previous MD discussed his case with neurosurgery.  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  PUD Prophylaxis : PPI  DVT Prophylaxis  :  Eliquis  Lab Results  Component Value Date   PLT 526 (H) 06/26/2019    Inpatient Medications  Scheduled Meds: . amitriptyline  20 mg Oral QHS  . apixaban  5 mg Oral BID  . vitamin C  500 mg Oral Daily  . aspirin EC  81 mg  Oral Daily  . atorvastatin  40 mg Oral q1800  . carvedilol  6.25 mg Oral BID WC  . dexamethasone (DECADRON) injection  4 mg Intravenous Q24H  . docusate sodium  100 mg Oral BID  . ezetimibe  10 mg Oral Daily  . feeding supplement (GLUCERNA SHAKE)  237 mL Oral BID BM  . insulin aspart  0-15 Units Subcutaneous TID WC  . insulin aspart  0-5 Units Subcutaneous QHS  . insulin aspart  3 Units Subcutaneous TID WC  . insulin glargine  20 Units Subcutaneous QHS  . pantoprazole  40 mg Oral Daily  . polyethylene glycol  17 g Oral Daily  . senna-docusate  2 tablet Oral QHS  . Vitamin D (Ergocalciferol)  50,000 Units Oral Q7 days  . zinc sulfate  220 mg Oral Daily   Continuous Infusions: PRN Meds:.albuterol, alum & mag hydroxide-simeth,  dextromethorphan-guaiFENesin, morphine injection, nitroGLYCERIN, [DISCONTINUED] ondansetron **OR** ondansetron (ZOFRAN) IV, traMADol  Antibiotics  :    Anti-infectives (From admission, onward)   Start     Dose/Rate Route Frequency Ordered Stop   06/23/19 1000  remdesivir 100 mg in sodium chloride 0.9 % 100 mL IVPB     100 mg 200 mL/hr over 30 Minutes Intravenous Daily 06/23/19 0750 06/23/19 0850   06/20/19 1000  remdesivir 100 mg in sodium chloride 0.9 % 100 mL IVPB  Status:  Discontinued     100 mg 200 mL/hr over 30 Minutes Intravenous Daily 06/19/19 1321 06/23/19 0750   06/19/19 1330  remdesivir 200 mg in sodium chloride 0.9% 250 mL IVPB     200 mg 580 mL/hr over 30 Minutes Intravenous Once 06/19/19 1321 06/19/19 1539       Time Spent in minutes  30   Lala Lund M.D on 06/26/2019 at 12:32 PM  To page go to [email or web address] - password [facility]  [facility] -  Office  [phone]  See all Orders from today for further details    Objective:   Vitals:   06/26/19 0500 06/26/19 0813 06/26/19 0850 06/26/19 1153  BP:  117/70  120/70  Pulse:  93 92   Resp:  (!) 23 (!) 26 (!) 24  Temp:  (!) 97.3 F (36.3 C)  (!) 97.2 F (36.2 C)  TempSrc:  Oral  Oral  SpO2:  95% 90% (!) 89%  Weight: 67.5 kg     Height:        Wt Readings from Last 3 Encounters:  06/26/19 67.5 kg  06/19/19 79.4 kg  05/27/19 74.8 kg     Intake/Output Summary (Last 24 hours) at 06/26/2019 1232 Last data filed at 06/26/2019 2376 Gross per 24 hour  Intake 810 ml  Output 2000 ml  Net -1190 ml     Physical Exam  Awake Alert,  No new F.N deficits, Normal affect [place].AT,PERRAL Supple Neck,No JVD, No cervical lymphadenopathy appriciated.  Symmetrical Chest wall movement, Good air movement bilaterally, CTAB RRR,No Gallops, Rubs or new Murmurs, No Parasternal Heave +ve B.Sounds, Abd Soft, No tenderness, No organomegaly appriciated, No rebound - guarding or rigidity. No Cyanosis, Clubbing or edema,  No new Rash or bruise   Data Review:    CBC Recent Labs  Lab 06/23/19 0857 06/24/19 0316 06/25/19 0500 06/25/19 0750 06/26/19 0200  WBC 8.6 6.1 13.1* 13.1* 9.2  HGB 14.6 14.4 14.9 14.8 16.6  HCT 42.1 41.5 43.3 43.0 49.3  PLT 264 288 377 370 526*  MCV 87.0 87.0 88.0 87.8 88.5  MCH 30.2 30.2 30.3  30.2 29.8  MCHC 34.7 34.7 34.4 34.4 33.7  RDW 11.7 11.8 11.8 11.9 11.9  LYMPHSABS  --   --   --   --  1.4  MONOABS  --   --   --   --  0.3  EOSABS  --   --   --   --  0.2  BASOSABS  --   --   --   --  0.0    Chemistries  Recent Labs  Lab 06/20/19 0529 06/21/19 0500 06/23/19 0355 06/24/19 0316 06/25/19 0500 06/25/19 0750 06/26/19 0200  NA 133* 136 136 133* 135 133* 138  K 4.2 5.2* 4.1 4.9 5.0 4.6 3.9  CL 101 103 100 100 99 99 97*  CO2 21* 17* _0 GLUCOSE 134* 119* 67* 204* 157* 187* 61*  BUN 23* 27* 21* 25* 30* 33* 36*  CREATININE 1.26* 1.16 1.27* 1.23 1.36* 1.22 1.38*  CALCIUM 8.2* 8.6* 8.4* 8.4* 9.0 8.6* 8.9  MG 2.6* 2.7*  --   --   --   --  2.4  AST 46*  --  36 27  --   --  32  ALT 29  --  31 24  --   --  26  ALKPHOS 54  --  60 58  --   --  66  BILITOT 0.4  --  0.9 0.6  --   --  0.5   ------------------------------------------------------------------------------------------------------------------ No results for input(s): CHOL, HDL, LDLCALC, TRIG, CHOLHDL, LDLDIRECT in the last 72 hours.  Lab Results  Component Value Date   HGBA1C 11.9 (H) 06/17/2019   ------------------------------------------------------------------------------------------------------------------ No results for input(s): TSH, T4TOTAL, T3FREE, THYROIDAB in the last 72 hours.  Invalid input(s): FREET3  Cardiac Enzymes No results for input(s): CKMB, TROPONINI, MYOGLOBIN in the last 168 hours.  Invalid input(s): CK ------------------------------------------------------------------------------------------------------------------    Component Value Date/Time   BNP 49.5 06/26/2019  0200    Micro Results Recent Results (from the past 240 hour(s))  SARS CORONAVIRUS 2 (TAT 6-24 HRS) Nasopharyngeal Nasopharyngeal Swab     Status: Abnormal   Collection Time: 06/17/19  4:20 PM   Specimen: Nasopharyngeal Swab  Result Value Ref Range Status   SARS Coronavirus 2 POSITIVE (A) NEGATIVE Final    Comment: RESULT CALLED TO, READ BACK BY AND VERIFIED WITH: RN MONIQUE DAVIS AT [facility] ON 06/17/2019 (NOTE) SARS-CoV-2 target nucleic acids are DETECTED. The SARS-CoV-2 RNA is generally detectable in upper and lower respiratory specimens during the acute phase of infection. Positive results are indicative of the presence of SARS-CoV-2 RNA. Clinical correlation with patient history and other diagnostic information is  necessary to determine patient infection status. Positive results do not rule out bacterial infection or co-infection with other viruses.  The expected result is Negative. Fact Sheet for Patients: SugarRoll.be Fact Sheet for Healthcare Providers: https://www.woods-mathews.com/ This test is not yet approved or cleared by the [place] FDA and  has been authorized for detection and/or diagnosis of SARS-CoV-2 by FDA under an Emergency Use Authorization (EUA). This EUA will remain  in effect (meaning this t est can be used) for the duration of the COVID-19 declaration under Section 564(b)(1) of the Act, 21 U.S.C. section 360bbb-3(b)(1), unless the authorization is terminated or revoked sooner. Performed at [facility], [place] [address], [place], [place] [address]     Radiology Reports DG Abd 1 View  Result Date: 06/21/2019 CLINICAL DATA:  Abdominal pain. EXAM: ABDOMEN - 1  VIEW COMPARISON:  Scout image for CT scan of the abdomen dated 01/23/2019 FINDINGS: There is air scattered throughout the nondistended large and small bowel. Stomach is not distended. No abnormal abdominal calcifications. Bones are  normal. IMPRESSION: Benign-appearing abdomen. Electronically Signed   By: Lorriane Shire M.D.   On: 06/21/2019 14:19   CT HEAD WO CONTRAST  Result Date: 06/17/2019 CLINICAL DATA:  Right-sided weakness, 2 days duration. Recent falling. EXAM: CT HEAD WITHOUT CONTRAST TECHNIQUE: Contiguous axial images were obtained from the base of the skull through the vertex without intravenous contrast. COMPARISON:  MRI 10/14/2017 FINDINGS: Brain: The brain shows a normal appearance without evidence of malformation, atrophy, old or acute small or large vessel infarction, mass lesion, hemorrhage, hydrocephalus or extra-axial collection. Vascular: There is atherosclerotic calcification of the major vessels at the base of the brain. Skull: Normal.  No traumatic finding.  No focal bone lesion. Sinuses/Orbits: Sinuses are clear. Orbits appear normal. Mastoids are clear. Other: None significant IMPRESSION: Normal head CT except for atherosclerotic calcification of the major vessels at the base of the brain. Electronically Signed   By: Nelson Chimes M.D.   On: 06/17/2019 14:41   MR BRAIN W WO CONTRAST  Result Date: 06/17/2019 CLINICAL DATA:  Neuro deficit. Subacute right-sided weakness. EXAM: MRI HEAD WITHOUT AND WITH CONTRAST TECHNIQUE: Multiplanar, multiecho pulse sequences of the brain and surrounding structures were obtained without and with intravenous contrast. CONTRAST:  93m GADAVIST GADOBUTROL 1 MMOL/ML IV SOLN COMPARISON:  CT head 06/17/2019. MRI 10/14/2017 FINDINGS: Brain: No acute infarction, hemorrhage, hydrocephalus, extra-axial collection or mass lesion. No significant chronic ischemia Normal enhancement postcontrast administration Vascular: Normal arterial flow voids Skull and upper cervical spine: No focal skeletal lesion. Sinuses/Orbits: Mild mucosal edema paranasal sinuses. Normal orbit Other: None IMPRESSION: Negative MRI head with contrast. Electronically Signed   By: CFranchot GalloM.D.   On: 06/17/2019 19:01    MR THORACIC SPINE WO CONTRAST  Result Date: 06/20/2019 CLINICAL DATA:  Initial evaluation for mid back pain. EXAM: MRI THORACIC SPINE WITHOUT CONTRAST TECHNIQUE: Multiplanar, multisequence MR imaging of the thoracic spine was performed. No intravenous contrast was administered. COMPARISON:  None available. FINDINGS: Alignment: Vertebral bodies normally aligned with preservation of the normal thoracic kyphosis. No listhesis or subluxation. Vertebrae: Vertebral body height maintained without evidence for acute or chronic fracture. Bone marrow signal intensity within normal limits. Small benign hemangioma noted within the T9 vertebral body. No worrisome osseous lesions. No abnormal marrow edema. Cord: Signal intensity within the thoracic spinal cord is within normal limits. Paraspinal and other soft tissues: Paraspinal soft tissues demonstrate no acute finding. Patchy multifocal opacities noted within the partially visualized lungs, likely related history of COVID infection. Small T2 hyperintense simple right renal cyst noted. Visualized visceral structures otherwise unremarkable. Disc levels: T1-2:  Unremarkable. T2-3: Unremarkable. T3-4: Tiny left paracentral disc protrusion minimally indents the left ventral thecal sac. No significant stenosis or cord deformity. T4-5:  Mild disc bulge. No significant canal or foraminal stenosis. T5-6: Small central disc protrusion minimally indents the ventral thecal sac. No significant stenosis or cord deformity. T6-7: Central/right paracentral disc protrusion indents the ventral thecal sac. Mild flattening of the ventral spinal cord without cord signal changes. No significant stenosis. T7-8: Central disc protrusion indents the ventral thecal sac, contacting and mildly flattening the ventral thoracic cord. Superimposed prominence of the dorsal epidural fat. No significant spinal stenosis. Foramina remain patent. T8-9: Small central disc protrusion indents the ventral thecal  sac. Associated annular fissure. Minimal  flattening of the ventral cord without cord signal changes. No significant stenosis. T9-10: Mild disc bulge with disc desiccation. Mild posterior element hypertrophy. No significant stenosis. T10-11:  Unremarkable. T11-12:  Unremarkable. T12-L1:  Unremarkable. IMPRESSION: 1. Multifocal disc protrusions involving the T3-4 through T9-10 interspaces as above, most prominent of which is positioned at the T7-8 level. No significant spinal stenosis. Findings could contribute to underlying back pain. 2. Patchy multifocal opacities within the partially visualized lungs, likely related to history of COVID infection. Electronically Signed   By: Jeannine Boga M.D.   On: 06/20/2019 00:04   MR LUMBAR SPINE WO CONTRAST  Result Date: 06/18/2019 CLINICAL DATA:  Initial evaluation for unilateral right lower extremity weakness, tingling in toes. EXAM: MRI LUMBAR SPINE WITHOUT CONTRAST TECHNIQUE: Multiplanar, multisequence MR imaging of the lumbar spine was performed. No intravenous contrast was administered. COMPARISON:  Prior radiograph from 01/18/2012. FINDINGS: Segmentation: Standard. Lowest well-formed disc space labeled the L5-S1 level. Alignment: Physiologic with preservation of the normal lumbar lordosis. No listhesis. Vertebrae: Vertebral body height maintained without evidence for acute or chronic fracture. Bone marrow signal intensity somewhat diffusely decreased on T1 weighted imaging, nonspecific, but most commonly related to anemia, smoking, or obesity. Subcentimeter benign hemangioma noted within the L4 vertebral body. No other discrete or worrisome osseous lesions. No abnormal marrow edema. Conus medullaris and cauda equina: Conus extends to the L1 level. Conus and cauda equina appear normal. Paraspinal and other soft tissues: Paraspinous soft tissues within normal limits. Partially visualized urinary bladder moderately distended. Visualized visceral structures  otherwise unremarkable. Disc levels: L1-2: Disc desiccation without significant disc bulge. No canal or neural foraminal stenosis. No impingement. L2-3: Mild disc desiccation without significant disc bulge. No canal or neural foraminal stenosis. L3-4: Disc desiccation with minimal annular disc bulge. No significant canal or neural foraminal stenosis. No impingement. L4-5: Disc desiccation with minimal annular disc bulge. Superimposed small left foraminal disc protrusion closely approximates the exiting left L4 nerve root without frank neural impingement (series 4, image 11). No significant canal or lateral recess stenosis. Mild left greater than right L4 foraminal stenosis. L5-S1: Negative interspace. Mild epidural lipomatosis. No canal or lateral recess stenosis. Foramina remain patent. No impingement. IMPRESSION: 1. Small left foraminal disc protrusion at L4-5, closely approximating and potentially irritating the exiting left L4 nerve root. 2. Mild left greater than right L4 foraminal stenosis related to disc bulge and facet disease. 3. Additional minor for age degenerative disc disease elsewhere within the lumbar spine without significant stenosis or neural impingement. No other findings to explain patient's right lower extremity symptoms identified. 4. Diffusely decreased T1 marrow signal intensity, nonspecific, but most commonly related to anemia, smoking, or obesity. Correlation with history and laboratory values recommended. Electronically Signed   By: Jeannine Boga M.D.   On: 06/18/2019 21:06   DG Chest Port 1 View  Result Date: 06/25/2019 CLINICAL DATA:  Shortness of breath EXAM: PORTABLE CHEST 1 VIEW COMPARISON:  June 23, 2019 FINDINGS: There is increase in airspace opacity in the left base with small left pleural effusion. There is also airspace opacity in the lateral right base with small right pleural effusion. Heart is mildly enlarged with pulmonary vascularity normal. No adenopathy.  There is aortic atherosclerosis. No bone lesions. IMPRESSION: Airspace opacity in both lower lung regions, more on the left on the right with small pleural effusions bilaterally. Heart mildly enlarged. Pulmonary vascularity normal. No adenopathy. Aortic Atherosclerosis (ICD10-I70.0). Electronically Signed   By: Lowella Grip III M.D.  On: 06/25/2019 08:12   DG CHEST PORT 1 VIEW  Result Date: 06/23/2019 CLINICAL DATA:  COVID-19 EXAM: PORTABLE CHEST 1 VIEW COMPARISON:  Two days ago FINDINGS: Similar extent of bilateral pulmonary infiltrate that is greater on the left. There may be mild progression at the right base. Normal heart size. No effusion or pneumothorax. IMPRESSION: Bilateral pneumonia which is stable or minimally progressed on the right. Electronically Signed   By: Monte Fantasia M.D.   On: 06/23/2019 08:59   DG CHEST PORT 1 VIEW  Result Date: 06/21/2019 CLINICAL DATA:  Hypoxemia and chest pain.  COVID 19 positive. EXAM: PORTABLE CHEST 1 VIEW COMPARISON:  06/19/2019 FINDINGS: Lungs are hypoinflated and demonstrate mild hazy opacification over the left base/retrocardiac region unchanged. No significant effusion. Cardiomediastinal silhouette and remainder of the exam is unchanged. IMPRESSION: Stable hazy opacification over the left base/retrocardiac region. Electronically Signed   By: Marin Olp M.D.   On: 06/21/2019 10:10   DG Chest Port 1 View  Result Date: 06/19/2019 CLINICAL DATA:  Cough. Right-sided weakness. EXAM: PORTABLE CHEST 1 VIEW COMPARISON:  06/17/2019 FINDINGS: There is a new focal area of atelectasis or infiltrate at the left lung base posterior medially. Minimal atelectasis at the right lung base. Lungs are otherwise clear. No effusions. Heart size and pulmonary vascularity are normal. No bone abnormality. IMPRESSION: 1. New focal area of atelectasis or infiltrate at the left lung base posterior medially. 2. Minimal new atelectasis at the right lung base. Electronically  Signed   By: Lorriane Shire M.D.   On: 06/19/2019 16:46   DG Chest Port 1 View  Result Date: 06/17/2019 CLINICAL DATA:  Chest pain EXAM: PORTABLE CHEST 1 VIEW COMPARISON:  June 05, 2019 FINDINGS: The heart size and mediastinal contours are within normal limits. Both lungs are clear. The visualized skeletal structures are unremarkable. IMPRESSION: No active disease. Electronically Signed   By: Prudencio Pair M.D.   On: 06/17/2019 12:12   DG Chest Portable 1 View  Result Date: 06/05/2019 CLINICAL DATA:  Chest pain and shortness of breath. EXAM: PORTABLE CHEST 1 VIEW COMPARISON:  01/23/2019 FINDINGS: The heart size and mediastinal contours are within normal limits. Both lungs are clear. The visualized skeletal structures are unremarkable. IMPRESSION: Normal exam. Electronically Signed   By: Lorriane Shire M.D.   On: 06/05/2019 18:08   DG HIP UNILAT WITH PELVIS 1V RIGHT  Result Date: 06/05/2019 CLINICAL DATA:  Right hip pain EXAM: DG HIP (WITH OR WITHOUT PELVIS) 1V RIGHT COMPARISON:  None. FINDINGS: There is no evidence of hip fracture or dislocation. There is no evidence of arthropathy or other focal bone abnormality. IMPRESSION: Negative. Electronically Signed   By: Rolm Baptise M.D.   On: 06/05/2019 20:36

## 2019-06-26 NOTE — Progress Notes (Signed)
Inpatient Diabetes Program Recommendations  AACE/ADA: New Consensus Statement on Inpatient Glycemic Control (2015)  Target Ranges:  Prepandial:   less than 140 mg/dL      Peak postprandial:   less than 180 mg/dL (1-2 hours)      Critically ill patients:  140 - 180 mg/dL   Lab Results  Component Value Date   GLUCAP 214 (H) 06/26/2019   HGBA1C 11.9 (H) 06/17/2019     Noted consult placed for DM and insulin teaching.  Patient was education on 12/15 earlier this admission. See note for further details. Pt already takes insulin at home.  Thanks,  Tama Headings RN, MSN, BC-ADM Inpatient Diabetes Coordinator Team Pager [phone] (8a-5p)

## 2019-06-26 NOTE — Progress Notes (Signed)
POC reviewed with patient, cont to encourgae q2 turns, vital signs closely monitored and any concerns addressed at this time. Will continue to monitor. NO BM since 06/17/2019  BP 128/85 (BP Location: Left Arm)   Pulse (!) 102   Temp 98 F (36.7 C) (Oral)   Resp (!) 28   Ht 5' 4"  (1.626 m) Comment: 162  Wt 67.5 kg   SpO2 98%   BMI 25.54 kg/m     Vital Signs MEWS/VS Documentation      06/26/2019 1800 06/26/2019 1900 06/26/2019 1936 06/26/2019 2025   MEWS Score:  1  3  3  3    MEWS Score Color:  Green  Yellow  Yellow  Yellow   Resp:  (!) 23  -  (!) 31  (!) 28   Pulse:  -  -  100  (!) 102   BP:  132/72  -  128/85  -   Temp:  (!) 97.5 F (36.4 C)  -  -  98 F (36.7 C)   O2 Device:  HFNC  -  HFNC  HFNC   O2 Flow Rate (L/min):  5 L/min  -  5 L/min  5 L/min        Intake/Output Summary (Last 24 hours) at 06/26/2019 2131 Last data filed at 06/26/2019 2000 Gross per 24 hour  Intake 700 ml  Output 1150 ml  Net -450 ml   bowel movement 06/17/2019    Calvin [name] Calvin [name] 06/26/2019,9:30 PM

## 2019-06-26 NOTE — Plan of Care (Signed)
  Problem: Education: Goal: Knowledge of General Education information will improve Description: Including pain rating scale, medication(s)/side effects and non-pharmacologic comfort measures Outcome: Progressing   Problem: Health Behavior/Discharge Planning: Goal: Ability to manage health-related needs will improve Outcome: Progressing   Problem: Clinical Measurements: Goal: Ability to maintain clinical measurements within normal limits will improve Outcome: Progressing   Problem: Clinical Measurements: Goal: Will remain free from infection Outcome: Progressing   Problem: Clinical Measurements: Goal: Diagnostic test results will improve Outcome: Progressing   Problem: Clinical Measurements: Goal: Respiratory complications will improve Outcome: Progressing   Problem: Clinical Measurements: Goal: Cardiovascular complication will be avoided Outcome: Progressing   Rested well throughout night.  Maintaining saturation on 10L HFNC.

## 2019-06-26 NOTE — Progress Notes (Signed)
Inpatient Diabetes Program Recommendations  AACE/ADA: New Consensus Statement on Inpatient Glycemic Control (2015)  Target Ranges:  Prepandial:   less than 140 mg/dL      Peak postprandial:   less than 180 mg/dL (1-2 hours)      Critically ill patients:  140 - 180 mg/dL   Lab Results  Component Value Date   GLUCAP 70 06/26/2019   HGBA1C 11.9 (H) 06/17/2019    Review of Glycemic Control Results for Calvin [name], [name] (MRN [id number]) as of 06/26/2019 09:10  Ref. Range 06/25/2019 12:09 06/25/2019 15:43 06/25/2019 17:22 06/25/2019 19:34 06/26/2019 07:50  Glucose-Capillary Latest Ref Range: 70 - 99 mg/dL 187 (H) 421 (H) 293 (H) 247 (H) 70    Outpatient Diabetes medications: Tresiba 220 units QD Current orders for Inpatient glycemic control: Novolog 3 units TID, Levemir 30 units at bedtime, Novolog 30 x 1, Novolog 0-15 units TID & HS Decadron reduced 4 mg BID on 12/22  Inpatient Diabetes Program Recommendations:    If post prandials continue to exceed inpatient goals consider increasing meal coverage to Novolog 6 units TID (assuming patient is consuming >50% of meal).   Thanks, Bronson Curb, MSN, RNC-OB Diabetes Coordinator [phone] (8a-5p)

## 2019-06-27 LAB — CBC WITH DIFFERENTIAL/PLATELET
Abs Immature Granulocytes: 0.03 10*3/uL (ref 0.00–0.07)
Basophils Absolute: 0 10*3/uL (ref 0.0–0.1)
Basophils Relative: 0 %
Eosinophils Absolute: 0.1 10*3/uL (ref 0.0–0.5)
Eosinophils Relative: 2 %
HCT: 47.8 % (ref 39.0–52.0)
Hemoglobin: 16.3 g/dL (ref 13.0–17.0)
Immature Granulocytes: 0 %
Lymphocytes Relative: 20 %
Lymphs Abs: 1.3 10*3/uL (ref 0.7–4.0)
MCH: 29.9 pg (ref 26.0–34.0)
MCHC: 34.1 g/dL (ref 30.0–36.0)
MCV: 87.5 fL (ref 80.0–100.0)
Monocytes Absolute: 0.2 10*3/uL (ref 0.1–1.0)
Monocytes Relative: 3 %
Neutro Abs: 5 10*3/uL (ref 1.7–7.7)
Neutrophils Relative %: 75 %
Platelets: 454 10*3/uL — ABNORMAL HIGH (ref 150–400)
RBC: 5.46 MIL/uL (ref 4.22–5.81)
RDW: 12 % (ref 11.5–15.5)
WBC: 6.7 10*3/uL (ref 4.0–10.5)
nRBC: 0 % (ref 0.0–0.2)

## 2019-06-27 LAB — BPAM RBC
Blood Product Expiration Date: 202012232359
Unit Type and Rh: 8400

## 2019-06-27 LAB — TYPE AND SCREEN
ABO/RH(D): AB POS
Antibody Screen: NEGATIVE
Unit division: 0

## 2019-06-27 LAB — D-DIMER, QUANTITATIVE: D-Dimer, Quant: 2.75 ug/mL-FEU — ABNORMAL HIGH (ref 0.00–0.50)

## 2019-06-27 LAB — COMPREHENSIVE METABOLIC PANEL
ALT: 23 U/L (ref 0–44)
AST: 29 U/L (ref 15–41)
Albumin: 2.7 g/dL — ABNORMAL LOW (ref 3.5–5.0)
Alkaline Phosphatase: 63 U/L (ref 38–126)
Anion gap: 11 (ref 5–15)
BUN: 35 mg/dL — ABNORMAL HIGH (ref 6–20)
CO2: 25 mmol/L (ref 22–32)
Calcium: 8.6 mg/dL — ABNORMAL LOW (ref 8.9–10.3)
Chloride: 99 mmol/L (ref 98–111)
Creatinine, Ser: 1.17 mg/dL (ref 0.61–1.24)
GFR calc Af Amer: >60 mL/min (ref >60)
GFR calc non Af Amer: >60 mL/min (ref >60)
Glucose, Bld: 87 mg/dL (ref 70–99)
Potassium: 4.2 mmol/L (ref 3.5–5.1)
Sodium: 135 mmol/L (ref 135–145)
Total Bilirubin: 0.5 mg/dL (ref 0.3–1.2)
Total Protein: 6.3 g/dL — ABNORMAL LOW (ref 6.5–8.1)

## 2019-06-27 LAB — GLUCOSE, CAPILLARY
Glucose-Capillary: 74 mg/dL (ref 70–99)
Glucose-Capillary: 95 mg/dL (ref 70–99)
Glucose-Capillary: 159 mg/dL — ABNORMAL HIGH (ref 70–99)
Glucose-Capillary: 206 mg/dL — ABNORMAL HIGH (ref 70–99)
Glucose-Capillary: 274 mg/dL — ABNORMAL HIGH (ref 70–99)
Glucose-Capillary: 245 mg/dL — ABNORMAL HIGH (ref 70–99)

## 2019-06-27 LAB — C-REACTIVE PROTEIN: CRP: 2.5 mg/dL — ABNORMAL HIGH (ref <1.0)

## 2019-06-27 LAB — MAGNESIUM: Magnesium: 2.3 mg/dL (ref 1.7–2.4)

## 2019-06-27 LAB — BRAIN NATRIURETIC PEPTIDE: B Natriuretic Peptide: 38.8 pg/mL (ref 0.0–100.0)

## 2019-06-27 MED ORDER — INSULIN ASPART 100 UNIT/ML ~~LOC~~ SOLN
0.0000 [IU] | Freq: Three times a day (TID) | SUBCUTANEOUS | Status: DC
  Administered 2019-06-27: 3 [IU] via SUBCUTANEOUS
  Administered 2019-06-27 – 2019-06-28 (×2): 5 [IU] via SUBCUTANEOUS
  Administered 2019-06-28: 9 [IU] via SUBCUTANEOUS
  Administered 2019-06-28: 2 [IU] via SUBCUTANEOUS
  Administered 2019-06-29: 7 [IU] via SUBCUTANEOUS
  Administered 2019-06-29: 1 [IU] via SUBCUTANEOUS
  Administered 2019-06-29 – 2019-06-30 (×2): 2 [IU] via SUBCUTANEOUS
  Administered 2019-06-30: 5 [IU] via SUBCUTANEOUS
  Administered 2019-06-30: 2 [IU] via SUBCUTANEOUS
  Administered 2019-07-01 (×2): 3 [IU] via SUBCUTANEOUS

## 2019-06-27 MED ORDER — MAGNESIUM HYDROXIDE 400 MG/5ML PO SUSP
30.0000 mL | Freq: Two times a day (BID) | ORAL | Status: AC
  Administered 2019-06-27 (×2): 30 mL via ORAL
  Filled 2019-06-27 (×2): qty 30

## 2019-06-27 MED ORDER — INSULIN ASPART 100 UNIT/ML ~~LOC~~ SOLN
0.0000 [IU] | Freq: Every day | SUBCUTANEOUS | Status: DC
  Administered 2019-06-27: 2 [IU] via SUBCUTANEOUS
  Administered 2019-06-28 – 2019-06-30 (×3): 3 [IU] via SUBCUTANEOUS

## 2019-06-27 NOTE — Progress Notes (Signed)
PROGRESS NOTE                                                                                                                                                                                                             Patient Demographics:    Calvin [name], is a 58 y.o. male, DOB - 11-14-1960, IHK:[id number]  Outpatient Primary MD for the patient is [name], NP    LOS - 8  Admit date - 06/17/2019    Chief Complaint  Patient presents with  . 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  He was found to have SIRS coronavirus 2 infection, he developed some hypoxic respiratory failure was treated with steroids, remdesivir, convalescent plasma and Actemra at [facility].  He was also evaluated by neurosurgery.  Subsequently sent to [facility] on 06/25/2019.   Subjective:   Patient in bed, appears comfortable, denies any headache, no fever, no chest pain or pressure, much improved shortness of breath , no abdominal pain.  Lower back pain radiating to right leg much improved.   Assessment  & Plan :     1. Acute Hypoxic Resp. Failure due to Acute Covid 19 Viral Pneumonitis during the ongoing 2020 Covid 19 Pandemic - he has moderate disease he has received appropriate treatment with steroids, remdesivir, convalescent plasma and Actemra at [facility].  He I also think he has some element of CHF, will continue steroids and start to diurese.  Increase activity, add I-S and flutter valve.  Clinically he is improving now down to 2 to 3 L nasal cannula oxygen, in no respiratory distress, continue tapering down steroids, advancing activity and tapering oxygen  down.  Encouraged the patient to sit up in chair in the daytime use I-S and flutter valve for pulmonary toiletry and then prone in bed when at night.  SpO2: 93 % O2 Flow Rate (L/min): 3 L/min FiO2 (%): 98 %  Recent Labs  Lab 06/21/19 1005 06/23/19 0355 06/23/19 0857 06/24/19 0316 06/25/19 0500 06/25/19 0750 06/26/19 0200 06/27/19 0205  CRP  --  20.9*  --  21.3* 11.3* 9.4* 6.1* 2.5*  DDIMER 0.55* 0.65*  --  1.01*  --   --  1.46* 2.75*  BNP 112.9*  --   --   --   --  59.8 49.5 38.8  PROCALCITON  --   --  0.21 0.22 0.11 <0.10  --   --     Hepatic Function Latest Ref Rng & Units 06/27/2019 06/26/2019 06/24/2019  Total Protein 6.5 - 8.1 g/dL 6.3(L) 6.7 6.3(L)  Albumin 3.5 - 5.0 g/dL 2.7(L) 2.8(L) 2.5(L)  AST 15 - 41 U/L 29 32 27  ALT 0 - 44 U/L 23 26 24   Alk Phosphatase 38 - 126 U/L 63 66 58  Total Bilirubin 0.3 - 1.2 mg/dL 0.5 0.5 0.6  Bilirubin, Direct 0.00 - 0.40 mg/dL - - -    2.  Acute on chronic right-sided lower back pain with radiation to right lower extremity and some right lower extremity weakness acute on chronic- previous MD had discussed the case and his MRI findings with neurosurgery, no further input, supportive care, weakness and pain improved.  Commence PT OT.  3.  Combined acute on chronic systolic and diastolic heart failure.  EF around 40% on echocardiogram done in 01/2019.  Continue Coreg, will add IV Lasix and monitor.  4.  Dyslipidemia.  Continue Zetia.  5.  Essential hypertension.  On Lasix and Coreg, ACE inhibitor on hold as he had developed mild AKI.   6.  Mild AKI on CKD 3.  Baseline creatinine anywhere between 1.15-1.3.  Monitor with diuresis.  7. DM2 -was on Lantus, premeal NovoLog and sliding scale.  Early morning hypoglycemia, dose adjusted, will get DM and insulin education prior to discharge as he had poor outpatient glycemic control due to hyperglycemia.  Lab Results  Component Value Date   HGBA1C 11.9 (H) 06/17/2019   CBG (last 3)   Recent Labs    06/27/19 0751 06/27/19 0817 06/27/19 0934  GLUCAP 95 74 159*      Condition - Fair  Family Communication  :   Sister Lelan Pons called on her listed cell phone number on 06/25/2019 at 10:20 AM.  Message left.  She later called back the same day and she was updated.  Called again on 06/25/2019 at 3:58 AM.  Again message left.  Called again on 06/27/2019 at 10:20 AM.  Message left again  Code Status :  Full  Diet :   Diet Order            Diet heart healthy/carb modified Room service appropriate? Yes; Fluid consistency: Thin  Diet effective now               Disposition Plan  :  TBD  Consults  : Previous MD discussed his case with neurosurgery.  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  PUD Prophylaxis : PPI  DVT Prophylaxis  :  Eliquis  Lab Results  Component Value Date   PLT 454 (H) 06/27/2019    Inpatient Medications  Scheduled Meds: . amitriptyline  20 mg Oral QHS  . apixaban  5 mg Oral BID  . vitamin C  500 mg Oral Daily  . aspirin EC  81 mg Oral Daily  . atorvastatin  40 mg Oral q1800  . carvedilol  6.25 mg Oral BID WC  . dexamethasone (DECADRON) injection  4 mg Intravenous Q24H  . docusate sodium  100 mg Oral BID  . ezetimibe  10 mg Oral Daily  . feeding supplement (GLUCERNA SHAKE)  237 mL Oral BID BM  . insulin aspart  0-5 Units Subcutaneous QHS  . insulin aspart  0-9 Units Subcutaneous TID WC  . magnesium hydroxide  30 mL Oral BID  . pantoprazole  40 mg Oral Daily  . polyethylene glycol  17 g Oral Daily  . senna-docusate  2 tablet Oral QHS  . Vitamin D (Ergocalciferol)  50,000 Units Oral Q7 days  . zinc sulfate  220 mg Oral Daily   Continuous Infusions: PRN Meds:.albuterol, alum & mag hydroxide-simeth, dextromethorphan-guaiFENesin, morphine injection, nitroGLYCERIN,  [DISCONTINUED] ondansetron **OR** ondansetron (ZOFRAN) IV, traMADol  Antibiotics  :    Anti-infectives (From admission, onward)   Start     Dose/Rate Route Frequency Ordered Stop   06/23/19 1000  remdesivir 100 mg in sodium chloride 0.9 % 100 mL IVPB     100 mg 200 mL/hr over 30 Minutes Intravenous Daily 06/23/19 0750 06/23/19 0850   06/20/19 1000  remdesivir 100 mg in sodium chloride 0.9 % 100 mL IVPB  Status:  Discontinued     100 mg 200 mL/hr over 30 Minutes Intravenous Daily 06/19/19 1321 06/23/19 0750   06/19/19 1330  remdesivir 200 mg in sodium chloride 0.9% 250 mL IVPB     200 mg 580 mL/hr over 30 Minutes Intravenous Once 06/19/19 1321 06/19/19 1539       Time Spent in minutes  30   Lala Lund M.D on 06/27/2019 at 10:19 AM  To page go to [email or web address] - password [facility]  [facility] -  Office  [phone]  See all Orders from today for further details    Objective:   Vitals:   06/27/19 0100 06/27/19 0431 06/27/19 0500 06/27/19 0823  BP:  117/75  105/67  Pulse:  95  92  Resp: 20 18  18   Temp:  98.1 F (36.7 C)  99.9 F (37.7 C)  TempSrc:  Oral  Oral  SpO2:  90%  93%  Weight:   69.3 kg   Height:   5' 4"  (1.626 m)     Wt Readings from Last 3 Encounters:  06/27/19 69.3 kg  06/19/19 79.4 kg  05/27/19 74.8 kg     Intake/Output Summary (Last 24 hours) at 06/27/2019 1019 Last data filed at 06/27/2019 0511 Gross per 24 hour  Intake 640 ml  Output 800 ml  Net -160 ml     Physical Exam  Awake Alert,   No new F.N deficits, Normal affect [place].AT,PERRAL Supple Neck,No JVD, No cervical lymphadenopathy appriciated.  Symmetrical Chest wall movement, Good air movement bilaterally, CTAB RRR,No Gallops, Rubs or new Murmurs, No Parasternal Heave +ve B.Sounds, Abd Soft, No tenderness, No organomegaly appriciated, No rebound - guarding or rigidity. No Cyanosis, Clubbing or edema, No new Rash or bruise    Data Review:    CBC Recent Labs  Lab  06/24/19 0316 06/25/19 0500 06/25/19 0750 06/26/19 0200 06/27/19 0205  WBC 6.1 13.1* 13.1* 9.2 6.7  HGB 14.4 14.9 14.8 16.6 16.3  HCT 41.5 43.3 43.0 49.3 47.8  PLT 288 377 370 526* 454*  MCV 87.0 88.0 87.8 88.5 87.5  MCH 30.2 30.3 30.2 29.8 29.9  MCHC 34.7 34.4 34.4 33.7 34.1  RDW 11.8 11.8 11.9 11.9 12.0  LYMPHSABS  --   --   --  1.4 1.3  MONOABS  --   --   --  0.3 0.2  EOSABS  --   --   --  0.2 0.1  BASOSABS  --   --   --  0.0 0.0    Chemistries  Recent Labs  Lab 06/21/19 0500 06/23/19 0355 06/24/19 0316 06/25/19 0500 06/25/19 0750 06/26/19 0200 06/27/19 0205  NA 136 136 133* 135 133* 138 135  K 5.2* 4.1 4.9 5.0 4.6 3.9 4.2  CL 103 100 100 99 99 97* 99  CO2 17* 26 23 24 23 28 25   GLUCOSE 119* 67* 204* 157* 187* 61* 87  BUN 27* 21* 25* 30* 33* 36* 35*  CREATININE 1.16 1.27* 1.23 1.36* 1.22 1.38* 1.17  CALCIUM 8.6* 8.4* 8.4* 9.0 8.6* 8.9 8.6*  MG 2.7*  --   --   --   --  2.4 2.3  AST  --  36 27  --   --  32 29  ALT  --  31 24  --   --  26 23  ALKPHOS  --  60 58  --   --  66 63  BILITOT  --  0.9 0.6  --   --  0.5 0.5   ------------------------------------------------------------------------------------------------------------------ No results for input(s): CHOL, HDL, LDLCALC, TRIG, CHOLHDL, LDLDIRECT in the last 72 hours.  Lab Results  Component Value Date   HGBA1C 11.9 (H) 06/17/2019   ------------------------------------------------------------------------------------------------------------------ No results for input(s): TSH, T4TOTAL, T3FREE, THYROIDAB in the last 72 hours.  Invalid input(s): FREET3  Cardiac Enzymes No results for input(s): CKMB, TROPONINI, MYOGLOBIN in the last 168 hours.  Invalid input(s): CK ------------------------------------------------------------------------------------------------------------------    Component Value Date/Time   BNP 38.8 06/27/2019 0205    Micro Results Recent Results (from the past 240 hour(s))  SARS  CORONAVIRUS 2 (TAT 6-24 HRS) Nasopharyngeal Nasopharyngeal Swab     Status: Abnormal   Collection Time: 06/17/19  4:20 PM   Specimen: Nasopharyngeal Swab  Result Value Ref Range Status   SARS Coronavirus 2 POSITIVE (A) NEGATIVE Final    Comment: RESULT CALLED TO, READ BACK BY AND VERIFIED WITH: RN MONIQUE DAVIS AT [facility] ON 06/17/2019 (NOTE) SARS-CoV-2 target nucleic acids are DETECTED. The SARS-CoV-2 RNA is generally detectable in upper and lower respiratory specimens during the acute phase of infection. Positive results are indicative of the presence of SARS-CoV-2 RNA. Clinical correlation with patient history and other diagnostic information is  necessary to determine patient infection status. Positive results do not rule out bacterial infection or co-infection with other viruses.  The expected result is Negative. Fact Sheet for Patients: SugarRoll.be Fact Sheet for Healthcare Providers: https://www.woods-mathews.com/ This test is not yet approved or cleared by the [place] FDA and  has been authorized for detection and/or diagnosis of SARS-CoV-2 by FDA under an Emergency Use Authorization (EUA). This EUA will remain  in effect (meaning this t est can be used) for the duration of the COVID-19 declaration under Section 564(b)(1) of the Act, 21 U.S.C. section 360bbb-3(b)(1), unless the authorization is terminated or revoked sooner. Performed at [facility], [place] [address] [place], [place] [address]     Radiology Reports DG Abd 1 View  Result Date: 06/21/2019 CLINICAL DATA:  Abdominal pain. EXAM: ABDOMEN - 1 VIEW COMPARISON:  Scout image for CT scan of the abdomen dated 01/23/2019 FINDINGS: There is air scattered throughout the nondistended large and small bowel. Stomach is not distended. No abnormal abdominal calcifications. Bones are normal. IMPRESSION: Benign-appearing abdomen. Electronically Signed   By:  Lorriane Shire  M.D.   On: 06/21/2019 14:19   CT HEAD WO CONTRAST  Result Date: 06/17/2019 CLINICAL DATA:  Right-sided weakness, 2 days duration. Recent falling. EXAM: CT HEAD WITHOUT CONTRAST TECHNIQUE: Contiguous axial images were obtained from the base of the skull through the vertex without intravenous contrast. COMPARISON:  MRI 10/14/2017 FINDINGS: Brain: The brain shows a normal appearance without evidence of malformation, atrophy, old or acute small or large vessel infarction, mass lesion, hemorrhage, hydrocephalus or extra-axial collection. Vascular: There is atherosclerotic calcification of the major vessels at the base of the brain. Skull: Normal.  No traumatic finding.  No focal bone lesion. Sinuses/Orbits: Sinuses are clear. Orbits appear normal. Mastoids are clear. Other: None significant IMPRESSION: Normal head CT except for atherosclerotic calcification of the major vessels at the base of the brain. Electronically Signed   By: Nelson Chimes M.D.   On: 06/17/2019 14:41   MR BRAIN W WO CONTRAST  Result Date: 06/17/2019 CLINICAL DATA:  Neuro deficit. Subacute right-sided weakness. EXAM: MRI HEAD WITHOUT AND WITH CONTRAST TECHNIQUE: Multiplanar, multiecho pulse sequences of the brain and surrounding structures were obtained without and with intravenous contrast. CONTRAST:  55m GADAVIST GADOBUTROL 1 MMOL/ML IV SOLN COMPARISON:  CT head 06/17/2019. MRI 10/14/2017 FINDINGS: Brain: No acute infarction, hemorrhage, hydrocephalus, extra-axial collection or mass lesion. No significant chronic ischemia Normal enhancement postcontrast administration Vascular: Normal arterial flow voids Skull and upper cervical spine: No focal skeletal lesion. Sinuses/Orbits: Mild mucosal edema paranasal sinuses. Normal orbit Other: None IMPRESSION: Negative MRI head with contrast. Electronically Signed   By: CFranchot GalloM.D.   On: 06/17/2019 19:01   MR THORACIC SPINE WO CONTRAST  Result Date: 06/20/2019 CLINICAL  DATA:  Initial evaluation for mid back pain. EXAM: MRI THORACIC SPINE WITHOUT CONTRAST TECHNIQUE: Multiplanar, multisequence MR imaging of the thoracic spine was performed. No intravenous contrast was administered. COMPARISON:  None available. FINDINGS: Alignment: Vertebral bodies normally aligned with preservation of the normal thoracic kyphosis. No listhesis or subluxation. Vertebrae: Vertebral body height maintained without evidence for acute or chronic fracture. Bone marrow signal intensity within normal limits. Small benign hemangioma noted within the T9 vertebral body. No worrisome osseous lesions. No abnormal marrow edema. Cord: Signal intensity within the thoracic spinal cord is within normal limits. Paraspinal and other soft tissues: Paraspinal soft tissues demonstrate no acute finding. Patchy multifocal opacities noted within the partially visualized lungs, likely related history of COVID infection. Small T2 hyperintense simple right renal cyst noted. Visualized visceral structures otherwise unremarkable. Disc levels: T1-2:  Unremarkable. T2-3: Unremarkable. T3-4: Tiny left paracentral disc protrusion minimally indents the left ventral thecal sac. No significant stenosis or cord deformity. T4-5:  Mild disc bulge. No significant canal or foraminal stenosis. T5-6: Small central disc protrusion minimally indents the ventral thecal sac. No significant stenosis or cord deformity. T6-7: Central/right paracentral disc protrusion indents the ventral thecal sac. Mild flattening of the ventral spinal cord without cord signal changes. No significant stenosis. T7-8: Central disc protrusion indents the ventral thecal sac, contacting and mildly flattening the ventral thoracic cord. Superimposed prominence of the dorsal epidural fat. No significant spinal stenosis. Foramina remain patent. T8-9: Small central disc protrusion indents the ventral thecal sac. Associated annular fissure. Minimal flattening of the ventral cord  without cord signal changes. No significant stenosis. T9-10: Mild disc bulge with disc desiccation. Mild posterior element hypertrophy. No significant stenosis. T10-11:  Unremarkable. T11-12:  Unremarkable. T12-L1:  Unremarkable. IMPRESSION: 1. Multifocal disc protrusions involving the T3-4 through T9-10 interspaces as above,  most prominent of which is positioned at the T7-8 level. No significant spinal stenosis. Findings could contribute to underlying back pain. 2. Patchy multifocal opacities within the partially visualized lungs, likely related to history of COVID infection. Electronically Signed   By: Jeannine Boga M.D.   On: 06/20/2019 00:04   MR LUMBAR SPINE WO CONTRAST  Result Date: 06/18/2019 CLINICAL DATA:  Initial evaluation for unilateral right lower extremity weakness, tingling in toes. EXAM: MRI LUMBAR SPINE WITHOUT CONTRAST TECHNIQUE: Multiplanar, multisequence MR imaging of the lumbar spine was performed. No intravenous contrast was administered. COMPARISON:  Prior radiograph from 01/18/2012. FINDINGS: Segmentation: Standard. Lowest well-formed disc space labeled the L5-S1 level. Alignment: Physiologic with preservation of the normal lumbar lordosis. No listhesis. Vertebrae: Vertebral body height maintained without evidence for acute or chronic fracture. Bone marrow signal intensity somewhat diffusely decreased on T1 weighted imaging, nonspecific, but most commonly related to anemia, smoking, or obesity. Subcentimeter benign hemangioma noted within the L4 vertebral body. No other discrete or worrisome osseous lesions. No abnormal marrow edema. Conus medullaris and cauda equina: Conus extends to the L1 level. Conus and cauda equina appear normal. Paraspinal and other soft tissues: Paraspinous soft tissues within normal limits. Partially visualized urinary bladder moderately distended. Visualized visceral structures otherwise unremarkable. Disc levels: L1-2: Disc desiccation without  significant disc bulge. No canal or neural foraminal stenosis. No impingement. L2-3: Mild disc desiccation without significant disc bulge. No canal or neural foraminal stenosis. L3-4: Disc desiccation with minimal annular disc bulge. No significant canal or neural foraminal stenosis. No impingement. L4-5: Disc desiccation with minimal annular disc bulge. Superimposed small left foraminal disc protrusion closely approximates the exiting left L4 nerve root without frank neural impingement (series 4, image 11). No significant canal or lateral recess stenosis. Mild left greater than right L4 foraminal stenosis. L5-S1: Negative interspace. Mild epidural lipomatosis. No canal or lateral recess stenosis. Foramina remain patent. No impingement. IMPRESSION: 1. Small left foraminal disc protrusion at L4-5, closely approximating and potentially irritating the exiting left L4 nerve root. 2. Mild left greater than right L4 foraminal stenosis related to disc bulge and facet disease. 3. Additional minor for age degenerative disc disease elsewhere within the lumbar spine without significant stenosis or neural impingement. No other findings to explain patient's right lower extremity symptoms identified. 4. Diffusely decreased T1 marrow signal intensity, nonspecific, but most commonly related to anemia, smoking, or obesity. Correlation with history and laboratory values recommended. Electronically Signed   By: Jeannine Boga M.D.   On: 06/18/2019 21:06   DG Chest Port 1 View  Result Date: 06/25/2019 CLINICAL DATA:  Shortness of breath EXAM: PORTABLE CHEST 1 VIEW COMPARISON:  June 23, 2019 FINDINGS: There is increase in airspace opacity in the left base with small left pleural effusion. There is also airspace opacity in the lateral right base with small right pleural effusion. Heart is mildly enlarged with pulmonary vascularity normal. No adenopathy. There is aortic atherosclerosis. No bone lesions. IMPRESSION: Airspace  opacity in both lower lung regions, more on the left on the right with small pleural effusions bilaterally. Heart mildly enlarged. Pulmonary vascularity normal. No adenopathy. Aortic Atherosclerosis (ICD10-I70.0). Electronically Signed   By: Lowella Grip III M.D.   On: 06/25/2019 08:12   DG CHEST PORT 1 VIEW  Result Date: 06/23/2019 CLINICAL DATA:  COVID-19 EXAM: PORTABLE CHEST 1 VIEW COMPARISON:  Two days ago FINDINGS: Similar extent of bilateral pulmonary infiltrate that is greater on the left. There may be mild progression at the  right base. Normal heart size. No effusion or pneumothorax. IMPRESSION: Bilateral pneumonia which is stable or minimally progressed on the right. Electronically Signed   By: Monte Fantasia M.D.   On: 06/23/2019 08:59   DG CHEST PORT 1 VIEW  Result Date: 06/21/2019 CLINICAL DATA:  Hypoxemia and chest pain.  COVID 19 positive. EXAM: PORTABLE CHEST 1 VIEW COMPARISON:  06/19/2019 FINDINGS: Lungs are hypoinflated and demonstrate mild hazy opacification over the left base/retrocardiac region unchanged. No significant effusion. Cardiomediastinal silhouette and remainder of the exam is unchanged. IMPRESSION: Stable hazy opacification over the left base/retrocardiac region. Electronically Signed   By: Marin Olp M.D.   On: 06/21/2019 10:10   DG Chest Port 1 View  Result Date: 06/19/2019 CLINICAL DATA:  Cough. Right-sided weakness. EXAM: PORTABLE CHEST 1 VIEW COMPARISON:  06/17/2019 FINDINGS: There is a new focal area of atelectasis or infiltrate at the left lung base posterior medially. Minimal atelectasis at the right lung base. Lungs are otherwise clear. No effusions. Heart size and pulmonary vascularity are normal. No bone abnormality. IMPRESSION: 1. New focal area of atelectasis or infiltrate at the left lung base posterior medially. 2. Minimal new atelectasis at the right lung base. Electronically Signed   By: Lorriane Shire M.D.   On: 06/19/2019 16:46   DG Chest  Port 1 View  Result Date: 06/17/2019 CLINICAL DATA:  Chest pain EXAM: PORTABLE CHEST 1 VIEW COMPARISON:  June 05, 2019 FINDINGS: The heart size and mediastinal contours are within normal limits. Both lungs are clear. The visualized skeletal structures are unremarkable. IMPRESSION: No active disease. Electronically Signed   By: Prudencio Pair M.D.   On: 06/17/2019 12:12   DG Chest Portable 1 View  Result Date: 06/05/2019 CLINICAL DATA:  Chest pain and shortness of breath. EXAM: PORTABLE CHEST 1 VIEW COMPARISON:  01/23/2019 FINDINGS: The heart size and mediastinal contours are within normal limits. Both lungs are clear. The visualized skeletal structures are unremarkable. IMPRESSION: Normal exam. Electronically Signed   By: Lorriane Shire M.D.   On: 06/05/2019 18:08   DG HIP UNILAT WITH PELVIS 1V RIGHT  Result Date: 06/05/2019 CLINICAL DATA:  Right hip pain EXAM: DG HIP (WITH OR WITHOUT PELVIS) 1V RIGHT COMPARISON:  None. FINDINGS: There is no evidence of hip fracture or dislocation. There is no evidence of arthropathy or other focal bone abnormality. IMPRESSION: Negative. Electronically Signed   By: Rolm Baptise M.D.   On: 06/05/2019 20:36

## 2019-06-27 NOTE — Progress Notes (Signed)
Occupational Therapy Treatment Patient Details Name: Calvin [name] MRN: [id number] DOB: 08-25-1960 Today's Date: 06/27/2019    History of present illness 58 y.o. 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 fib on Eliquis, obstructive sleep apnea, HLD came to the hospital with complaints of right-sided weakness of upper and lower extremity. CT negative.   Positive for COVID,Acute Hypoxic Resp. Failure due to Acute Covid 19 Viral Pneumonitis   OT comments  Pt continues to make progress in therapy, requiring less assistance with self-care and functional transfer tasks as well as requiring less supplemental oxygen. Pt on 3L [place] with SpO2 95% at rest. Pt engaged in simple grooming/hygiene tasks while seated in bedside chair requiring frequent rest breaks due to fatigue. Pt able to transfer to/from bedside commode with min guard, noting 0 instances of loss of balance. Pt's SpO2 decreased to mid 80s with transfer, requiring ~2 min seated rest break to return to 90s. RR in 30s during activity. 3/4 DOE. Pt required increased time to complete all tasks due to fatigue. Continued to encourage pt to sit up in bedside chair throughout the day and work on breathing exercises with fair understanding.    Follow Up Recommendations  Home health OT;Supervision/Assistance - 24 hour(HH vs SNF depending on progress in therapy)    Equipment Recommendations  3 in 1 bedside commode    Recommendations for Other Services      Precautions / Restrictions Precautions Precautions: Fall Restrictions Weight Bearing Restrictions: No       Mobility Bed Mobility               General bed mobility comments: Pt seated in bedside chair upon OT arrival  Transfers Overall transfer level: Needs assistance Equipment used: None Transfers: Sit to/from Stand;Stand Pivot Transfers Sit to Stand: Min guard Stand pivot  transfers: Min guard       General transfer comment: Pt transferred to/from [facility] from bedside chair.     Balance Overall balance assessment: Needs assistance   Sitting balance-Leahy Scale: Fair       Standing balance-Leahy Scale: Fair                             ADL either performed or assessed with clinical judgement   ADL Overall ADL's : Needs assistance/impaired Eating/Feeding: Set up;Sitting   Grooming: Set up;Supervision/safety;Sitting;Wash/dry hands;Wash/dry Sports administrator: Min guard;BSC   Toileting- Water quality scientist and Hygiene: Minimal assistance;Sit to/from stand       Functional mobility during ADLs: Min guard General ADL Comments: Pt able to transfer to/from [facility] with min guard and without use of AD.     Vision       Perception     Praxis      Cognition Arousal/Alertness: Lethargic Behavior During Therapy: Restless;Flat affect Overall Cognitive Status: Within Functional Limits for tasks assessed                                          Exercises Exercises: Other exercises Other Exercises Other Exercises: Incentive spirometer x 10 with mod cues on technique. Pulling 59m Other Exercises: Flutter valve x 10 with mod cues on technique.    Shoulder Instructions  General Comments Pt on 3L [place] with SpO2 at 95% at rest. SpO2 decreased to 84% during transfers on 3L [place] with pt requiring ~2 min seated rest break to increase back to 90s. Fatigue, SOB, and back pain limiting performance this session.    Pertinent Vitals/ Pain       Pain Assessment: 0-10 Pain Score: 8  Pain Location: Back Pain Descriptors / Indicators: Aching;Grimacing;Discomfort Pain Intervention(s): Limited activity within patient's tolerance;Monitored during session;Repositioned  Home Living                                          Prior Functioning/Environment              Frequency            Progress Toward Goals  OT Goals(current goals can now be found in the care plan section)  Progress towards OT goals: Progressing toward goals  ADL Goals Pt Will Perform Grooming: sitting;with modified independence Pt Will Perform Upper Body Dressing: with modified independence;sitting Pt/caregiver will Perform Home Exercise Program: Increased strength;Increased ROM;Right Upper extremity;Independently;With written HEP provided Additional ADL Goal #1: Pt will participate in OOB ADL at [facility] assist level. Additional ADL Goal #2: Pt will tolerate ADL activity >7 min with VSS prior to requiring rest break.  Plan Discharge plan remains appropriate;Frequency remains appropriate    Co-evaluation                 AM-PAC OT "6 Clicks" Daily Activity     Outcome Measure   Help from another person eating meals?: None Help from another person taking care of personal grooming?: A Little Help from another person toileting, which includes using toliet, bedpan, or urinal?: A Lot Help from another person bathing (including washing, rinsing, drying)?: A Lot Help from another person to put on and taking off regular upper body clothing?: A Lot Help from another person to put on and taking off regular lower body clothing?: Total 6 Click Score: 14    End of Session Equipment Utilized During Treatment: Oxygen  OT Visit Diagnosis: Muscle weakness (generalized) (M62.81);Unsteadiness on feet (R26.81)   Activity Tolerance Patient limited by fatigue;Patient limited by lethargy;Patient limited by pain(Limited by SOB)   Patient Left in chair;with call bell/phone within reach   Nurse Communication Mobility status        Time: 7078-6754 OT Time Calculation (min): 41 min  Charges: OT General Charges $OT Visit: 1 Visit OT Treatments $Self Care/Home Management : 23-37 mins $Therapeutic Exercise: 8-22 mins  Mauri Brooklyn OTR/L [phone]    Mauri Brooklyn 06/27/2019, 12:52  PM

## 2019-06-28 LAB — CBC WITH DIFFERENTIAL/PLATELET
Abs Immature Granulocytes: 0.05 10*3/uL (ref 0.00–0.07)
Basophils Absolute: 0 10*3/uL (ref 0.0–0.1)
Basophils Relative: 0 %
Eosinophils Absolute: 0.1 10*3/uL (ref 0.0–0.5)
Eosinophils Relative: 1 %
HCT: 48.3 % (ref 39.0–52.0)
Hemoglobin: 16.3 g/dL (ref 13.0–17.0)
Immature Granulocytes: 1 %
Lymphocytes Relative: 14 %
Lymphs Abs: 1.2 10*3/uL (ref 0.7–4.0)
MCH: 30.2 pg (ref 26.0–34.0)
MCHC: 33.7 g/dL (ref 30.0–36.0)
MCV: 89.6 fL (ref 80.0–100.0)
Monocytes Absolute: 0.3 10*3/uL (ref 0.1–1.0)
Monocytes Relative: 4 %
Neutro Abs: 6.9 10*3/uL (ref 1.7–7.7)
Neutrophils Relative %: 80 %
Platelets: 416 10*3/uL — ABNORMAL HIGH (ref 150–400)
RBC: 5.39 MIL/uL (ref 4.22–5.81)
RDW: 11.9 % (ref 11.5–15.5)
WBC: 8.6 10*3/uL (ref 4.0–10.5)
nRBC: 0 % (ref 0.0–0.2)

## 2019-06-28 LAB — BASIC METABOLIC PANEL
Anion gap: 12 (ref 5–15)
BUN: 28 mg/dL — ABNORMAL HIGH (ref 6–20)
CO2: 26 mmol/L (ref 22–32)
Calcium: 8.6 mg/dL — ABNORMAL LOW (ref 8.9–10.3)
Chloride: 93 mmol/L — ABNORMAL LOW (ref 98–111)
Creatinine, Ser: 1.17 mg/dL (ref 0.61–1.24)
GFR calc Af Amer: >60 mL/min (ref >60)
GFR calc non Af Amer: >60 mL/min (ref >60)
Glucose, Bld: 364 mg/dL — ABNORMAL HIGH (ref 70–99)
Potassium: 4.8 mmol/L (ref 3.5–5.1)
Sodium: 131 mmol/L — ABNORMAL LOW (ref 135–145)

## 2019-06-28 LAB — GLUCOSE, CAPILLARY
Glucose-Capillary: 152 mg/dL — ABNORMAL HIGH (ref 70–99)
Glucose-Capillary: 233 mg/dL — ABNORMAL HIGH (ref 70–99)
Glucose-Capillary: 386 mg/dL — ABNORMAL HIGH (ref 70–99)
Glucose-Capillary: 329 mg/dL — ABNORMAL HIGH (ref 70–99)

## 2019-06-28 LAB — C-REACTIVE PROTEIN: CRP: 0.8 mg/dL (ref <1.0)

## 2019-06-28 LAB — BRAIN NATRIURETIC PEPTIDE: B Natriuretic Peptide: 103.5 pg/mL — ABNORMAL HIGH (ref 0.0–100.0)

## 2019-06-28 LAB — MAGNESIUM: Magnesium: 2.6 mg/dL — ABNORMAL HIGH (ref 1.7–2.4)

## 2019-06-28 LAB — D-DIMER, QUANTITATIVE: D-Dimer, Quant: 2.38 ug/mL-FEU — ABNORMAL HIGH (ref 0.00–0.50)

## 2019-06-28 MED ORDER — DEXAMETHASONE SODIUM PHOSPHATE 4 MG/ML IJ SOLN
2.0000 mg | INTRAMUSCULAR | Status: DC
  Administered 2019-06-28: 2 mg via INTRAVENOUS
  Filled 2019-06-28: qty 1

## 2019-06-28 NOTE — Progress Notes (Addendum)
PROGRESS NOTE                                                                                                                                                                                                             Patient Demographics:    Calvin [name], is a 58 y.o. male, DOB - 16-Jun-1961, NFA:[id number]  Outpatient Primary MD for the patient is [name], NP    LOS - 9  Admit date - 06/17/2019    Chief Complaint  Patient presents with  . 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  He was found to have SIRS coronavirus 2 infection, he developed some hypoxic respiratory failure was treated with steroids, remdesivir, convalescent plasma and Actemra at [facility].  He was also evaluated by neurosurgery.  Subsequently sent to [facility] on 06/25/2019.   Subjective:   Patient in bed, appears comfortable, denies any headache, no fever, no chest pain or pressure, no shortness of breath , no abdominal pain. No focal weakness.   Assessment  & Plan :     1. Acute Hypoxic Resp. Failure due to Acute Covid 19 Viral Pneumonitis during the ongoing 2020 Covid 19 Pandemic - he has moderate disease he has received appropriate treatment with steroids, remdesivir, convalescent plasma and Actemra at [facility].  He also had some element of CHF, will continue steroids and start to diurese.  Increase activity, add I-S and flutter valve.  Clinically he is improving now down to 2 to 3 L nasal cannula oxygen, in no respiratory distress, continue tapering down steroids, advancing activity and tapering oxygen down.  Encouraged the patient to sit up in chair in the  daytime use I-S and flutter valve for pulmonary toiletry and then prone in bed when at night.  SpO2: 97 % O2 Flow Rate (L/min): 3 L/min FiO2 (%): 98 %  Recent Labs  Lab 06/21/19 1005 06/23/19 0355 06/23/19 0857 06/24/19 0316 06/25/19 0500 06/25/19 0750 06/26/19 0200 06/27/19 0205  CRP  --  20.9*  --  21.3* 11.3* 9.4* 6.1* 2.5*  DDIMER 0.55* 0.65*  --  1.01*  --   --  1.46* 2.75*  BNP 112.9*  --   --   --   --  59.8 49.5 38.8  PROCALCITON  --   --  0.21 0.22 0.11 <0.10  --   --     Hepatic Function Latest Ref Rng & Units 06/27/2019 06/26/2019 06/24/2019  Total Protein 6.5 - 8.1 g/dL 6.3(L) 6.7 6.3(L)  Albumin 3.5 - 5.0 g/dL 2.7(L) 2.8(L) 2.5(L)  AST 15 - 41 U/L 29 32 27  ALT 0 - 44 U/L 23 26 24   Alk Phosphatase 38 - 126 U/L 63 66 58  Total Bilirubin 0.3 - 1.2 mg/dL 0.5 0.5 0.6  Bilirubin, Direct 0.00 - 0.40 mg/dL - - -    2.  Acute on chronic right-sided lower back pain with radiation to right lower extremity and some right lower extremity weakness acute on chronic- previous MD had discussed the case and his MRI findings with neurosurgery, no further input, supportive care, weakness and pain improved.  Commence PT OT.  3.  Combined acute on chronic systolic and diastolic heart failure.  EF around 40% on echocardiogram done in 01/2019.  Continue Coreg, will add IV Lasix and monitor.  4.  Dyslipidemia.  Continue Zetia.  5.  Essential hypertension.  On Lasix and Coreg, ACE inhibitor on hold as he had developed mild AKI.   6.  Mild AKI on CKD 3.  Baseline creatinine anywhere between 1.15-1.3.  Monitor with diuresis.  7. DM2 -was on Lantus, premeal NovoLog and sliding scale.  Early morning hypoglycemia, dose adjusted, will get DM and insulin education prior to discharge as he had poor outpatient glycemic control due to hyperglycemia.  Lab Results  Component Value Date   HGBA1C 11.9 (H) 06/17/2019   CBG (last 3)  Recent Labs    06/27/19 1137 06/27/19 1717 06/27/19 2104    GLUCAP 206* 274* 245*      Condition - Fair  Family Communication  :   Sister Lelan Pons called on her listed cell phone number on 06/25/2019 at 10:20 AM.  Message left.  She later called back the same day and she was updated.  Called again on 06/25/2019 at 3:58 AM.  Again message left.  Called again on 06/27/2019 at 10:20 AM.  Message left again  Code Status :  Full  Diet :   Diet Order            Diet heart healthy/carb modified Room service appropriate? Yes; Fluid consistency: Thin  Diet effective now               Disposition Plan  :  TBD  Consults  : Previous MD discussed his case with neurosurgery.  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  PUD Prophylaxis : PPI  DVT Prophylaxis  :  Eliquis  Lab Results  Component Value Date   PLT 454 (H) 06/27/2019    Inpatient Medications  Scheduled Meds: . amitriptyline  20 mg Oral QHS  . apixaban  5 mg Oral BID  . vitamin C  500 mg Oral Daily  . aspirin EC  81 mg Oral Daily  . atorvastatin  40 mg Oral q1800  . carvedilol  6.25 mg Oral BID WC  . dexamethasone (DECADRON) injection  4 mg Intravenous Q24H  . docusate sodium  100 mg  Oral BID  . ezetimibe  10 mg Oral Daily  . feeding supplement (GLUCERNA SHAKE)  237 mL Oral BID BM  . insulin aspart  0-5 Units Subcutaneous QHS  . insulin aspart  0-9 Units Subcutaneous TID WC  . pantoprazole  40 mg Oral Daily  . polyethylene glycol  17 g Oral Daily  . senna-docusate  2 tablet Oral QHS  . Vitamin D (Ergocalciferol)  50,000 Units Oral Q7 days  . zinc sulfate  220 mg Oral Daily   Continuous Infusions: PRN Meds:.albuterol, alum & mag hydroxide-simeth, dextromethorphan-guaiFENesin, morphine injection, nitroGLYCERIN, [DISCONTINUED] ondansetron **OR** ondansetron (ZOFRAN) IV, traMADol  Antibiotics  :    Anti-infectives  (From admission, onward)   Start     Dose/Rate Route Frequency Ordered Stop   06/23/19 1000  remdesivir 100 mg in sodium chloride 0.9 % 100 mL IVPB     100 mg 200 mL/hr over 30 Minutes Intravenous Daily 06/23/19 0750 06/23/19 0850   06/20/19 1000  remdesivir 100 mg in sodium chloride 0.9 % 100 mL IVPB  Status:  Discontinued     100 mg 200 mL/hr over 30 Minutes Intravenous Daily 06/19/19 1321 06/23/19 0750   06/19/19 1330  remdesivir 200 mg in sodium chloride 0.9% 250 mL IVPB     200 mg 580 mL/hr over 30 Minutes Intravenous Once 06/19/19 1321 06/19/19 1539       Time Spent in minutes  30   Lala Lund M.D on 06/28/2019 at 9:08 AM  To page go to [email or web address] - password [facility]  [facility] -  Office  [phone]  See all Orders from today for further details    Objective:   Vitals:   06/27/19 1938 06/28/19 0015 06/28/19 0500 06/28/19 0734  BP: 118/79 106/67 114/65 126/85  Pulse: 93 87 88 84  Resp: 19 16 15  (!) 22  Temp: 98 F (36.7 C) 98.4 F (36.9 C) 98 F (36.7 C) 98.5 F (36.9 C)  TempSrc: Oral Oral Oral Oral  SpO2: 91% 95% 96% 97%  Weight:   68.9 kg   Height:        Wt Readings from Last 3 Encounters:  06/28/19 68.9 kg  06/19/19 79.4 kg  05/27/19 74.8 kg     Intake/Output Summary (Last 24 hours) at 06/28/2019 0908 Last data filed at 06/28/2019 0159 Gross per 24 hour  Intake 1000 ml  Output 1250 ml  Net -250 ml     Physical Exam  Awake Alert,  No new F.N deficits, Normal affect [place].AT,PERRAL Supple Neck,No JVD, No cervical lymphadenopathy appriciated.  Symmetrical Chest wall movement, Good air movement bilaterally, CTAB RRR,No Gallops, Rubs or new Murmurs, No Parasternal Heave +ve B.Sounds, Abd Soft, No tenderness, No organomegaly appriciated, No rebound - guarding or rigidity. No Cyanosis, Clubbing or edema, No new Rash or bruise    Data Review:    CBC Recent Labs  Lab 06/24/19 0316 06/25/19 0500 06/25/19 0750 06/26/19 0200  06/27/19 0205  WBC 6.1 13.1* 13.1* 9.2 6.7  HGB 14.4 14.9 14.8 16.6 16.3  HCT 41.5 43.3 43.0 49.3 47.8  PLT 288 377 370 526* 454*  MCV 87.0 88.0 87.8 88.5 87.5  MCH 30.2 30.3 30.2 29.8 29.9  MCHC 34.7 34.4 34.4 33.7 34.1  RDW 11.8 11.8 11.9 11.9 12.0  LYMPHSABS  --   --   --  1.4 1.3  MONOABS  --   --   --  0.3 0.2  EOSABS  --   --   --  0.2 0.1  BASOSABS  --   --   --  0.0 0.0    Chemistries  Recent Labs  Lab 06/23/19 0355 06/24/19 0316 06/25/19 0500 06/25/19 0750 06/26/19 0200 06/27/19 0205  NA 136 133* 135 133* 138 135  K 4.1 4.9 5.0 4.6 3.9 4.2  CL 100 100 99 99 97* 99  CO2 26 23 24 23 28 25   GLUCOSE 67* 204* 157* 187* 61* 87  BUN 21* 25* 30* 33* 36* 35*  CREATININE 1.27* 1.23 1.36* 1.22 1.38* 1.17  CALCIUM 8.4* 8.4* 9.0 8.6* 8.9 8.6*  MG  --   --   --   --  2.4 2.3  AST 36 27  --   --  32 29  ALT 31 24  --   --  26 23  ALKPHOS 60 58  --   --  66 63  BILITOT 0.9 0.6  --   --  0.5 0.5   ------------------------------------------------------------------------------------------------------------------ No results for input(s): CHOL, HDL, LDLCALC, TRIG, CHOLHDL, LDLDIRECT in the last 72 hours.  Lab Results  Component Value Date   HGBA1C 11.9 (H) 06/17/2019   ------------------------------------------------------------------------------------------------------------------ No results for input(s): TSH, T4TOTAL, T3FREE, THYROIDAB in the last 72 hours.  Invalid input(s): FREET3  Cardiac Enzymes No results for input(s): CKMB, TROPONINI, MYOGLOBIN in the last 168 hours.  Invalid input(s): CK ------------------------------------------------------------------------------------------------------------------    Component Value Date/Time   BNP 38.8 06/27/2019 0205    Micro Results No results found for this or any previous visit (from the past 240 hour(s)).  Radiology Reports DG Abd 1 View  Result Date: 06/21/2019 CLINICAL DATA:  Abdominal pain. EXAM: ABDOMEN -  1 VIEW COMPARISON:  Scout image for CT scan of the abdomen dated 01/23/2019 FINDINGS: There is air scattered throughout the nondistended large and small bowel. Stomach is not distended. No abnormal abdominal calcifications. Bones are normal. IMPRESSION: Benign-appearing abdomen. Electronically Signed   By: Lorriane Shire M.D.   On: 06/21/2019 14:19   CT HEAD WO CONTRAST  Result Date: 06/17/2019 CLINICAL DATA:  Right-sided weakness, 2 days duration. Recent falling. EXAM: CT HEAD WITHOUT CONTRAST TECHNIQUE: Contiguous axial images were obtained from the base of the skull through the vertex without intravenous contrast. COMPARISON:  MRI 10/14/2017 FINDINGS: Brain: The brain shows a normal appearance without evidence of malformation, atrophy, old or acute small or large vessel infarction, mass lesion, hemorrhage, hydrocephalus or extra-axial collection. Vascular: There is atherosclerotic calcification of the major vessels at the base of the brain. Skull: Normal.  No traumatic finding.  No focal bone lesion. Sinuses/Orbits: Sinuses are clear. Orbits appear normal. Mastoids are clear. Other: None significant IMPRESSION: Normal head CT except for atherosclerotic calcification of the major vessels at the base of the brain. Electronically Signed   By: Nelson Chimes M.D.   On: 06/17/2019 14:41   MR BRAIN W WO CONTRAST  Result Date: 06/17/2019 CLINICAL DATA:  Neuro deficit. Subacute right-sided weakness. EXAM: MRI HEAD WITHOUT AND WITH CONTRAST TECHNIQUE: Multiplanar, multiecho pulse sequences of the brain and surrounding structures were obtained without and with intravenous contrast. CONTRAST:  34m GADAVIST GADOBUTROL 1 MMOL/ML IV SOLN COMPARISON:  CT head 06/17/2019. MRI 10/14/2017 FINDINGS: Brain: No acute infarction, hemorrhage, hydrocephalus, extra-axial collection or mass lesion. No significant chronic ischemia Normal enhancement postcontrast administration Vascular: Normal arterial flow voids Skull and upper  cervical spine: No focal skeletal lesion. Sinuses/Orbits: Mild mucosal edema paranasal sinuses. Normal orbit Other: None IMPRESSION: Negative MRI head with contrast. Electronically Signed  By: Franchot Gallo M.D.   On: 06/17/2019 19:01   MR THORACIC SPINE WO CONTRAST  Result Date: 06/20/2019 CLINICAL DATA:  Initial evaluation for mid back pain. EXAM: MRI THORACIC SPINE WITHOUT CONTRAST TECHNIQUE: Multiplanar, multisequence MR imaging of the thoracic spine was performed. No intravenous contrast was administered. COMPARISON:  None available. FINDINGS: Alignment: Vertebral bodies normally aligned with preservation of the normal thoracic kyphosis. No listhesis or subluxation. Vertebrae: Vertebral body height maintained without evidence for acute or chronic fracture. Bone marrow signal intensity within normal limits. Small benign hemangioma noted within the T9 vertebral body. No worrisome osseous lesions. No abnormal marrow edema. Cord: Signal intensity within the thoracic spinal cord is within normal limits. Paraspinal and other soft tissues: Paraspinal soft tissues demonstrate no acute finding. Patchy multifocal opacities noted within the partially visualized lungs, likely related history of COVID infection. Small T2 hyperintense simple right renal cyst noted. Visualized visceral structures otherwise unremarkable. Disc levels: T1-2:  Unremarkable. T2-3: Unremarkable. T3-4: Tiny left paracentral disc protrusion minimally indents the left ventral thecal sac. No significant stenosis or cord deformity. T4-5:  Mild disc bulge. No significant canal or foraminal stenosis. T5-6: Small central disc protrusion minimally indents the ventral thecal sac. No significant stenosis or cord deformity. T6-7: Central/right paracentral disc protrusion indents the ventral thecal sac. Mild flattening of the ventral spinal cord without cord signal changes. No significant stenosis. T7-8: Central disc protrusion indents the ventral thecal  sac, contacting and mildly flattening the ventral thoracic cord. Superimposed prominence of the dorsal epidural fat. No significant spinal stenosis. Foramina remain patent. T8-9: Small central disc protrusion indents the ventral thecal sac. Associated annular fissure. Minimal flattening of the ventral cord without cord signal changes. No significant stenosis. T9-10: Mild disc bulge with disc desiccation. Mild posterior element hypertrophy. No significant stenosis. T10-11:  Unremarkable. T11-12:  Unremarkable. T12-L1:  Unremarkable. IMPRESSION: 1. Multifocal disc protrusions involving the T3-4 through T9-10 interspaces as above, most prominent of which is positioned at the T7-8 level. No significant spinal stenosis. Findings could contribute to underlying back pain. 2. Patchy multifocal opacities within the partially visualized lungs, likely related to history of COVID infection. Electronically Signed   By: Jeannine Boga M.D.   On: 06/20/2019 00:04   MR LUMBAR SPINE WO CONTRAST  Result Date: 06/18/2019 CLINICAL DATA:  Initial evaluation for unilateral right lower extremity weakness, tingling in toes. EXAM: MRI LUMBAR SPINE WITHOUT CONTRAST TECHNIQUE: Multiplanar, multisequence MR imaging of the lumbar spine was performed. No intravenous contrast was administered. COMPARISON:  Prior radiograph from 01/18/2012. FINDINGS: Segmentation: Standard. Lowest well-formed disc space labeled the L5-S1 level. Alignment: Physiologic with preservation of the normal lumbar lordosis. No listhesis. Vertebrae: Vertebral body height maintained without evidence for acute or chronic fracture. Bone marrow signal intensity somewhat diffusely decreased on T1 weighted imaging, nonspecific, but most commonly related to anemia, smoking, or obesity. Subcentimeter benign hemangioma noted within the L4 vertebral body. No other discrete or worrisome osseous lesions. No abnormal marrow edema. Conus medullaris and cauda equina: Conus  extends to the L1 level. Conus and cauda equina appear normal. Paraspinal and other soft tissues: Paraspinous soft tissues within normal limits. Partially visualized urinary bladder moderately distended. Visualized visceral structures otherwise unremarkable. Disc levels: L1-2: Disc desiccation without significant disc bulge. No canal or neural foraminal stenosis. No impingement. L2-3: Mild disc desiccation without significant disc bulge. No canal or neural foraminal stenosis. L3-4: Disc desiccation with minimal annular disc bulge. No significant canal or neural foraminal stenosis. No impingement.  L4-5: Disc desiccation with minimal annular disc bulge. Superimposed small left foraminal disc protrusion closely approximates the exiting left L4 nerve root without frank neural impingement (series 4, image 11). No significant canal or lateral recess stenosis. Mild left greater than right L4 foraminal stenosis. L5-S1: Negative interspace. Mild epidural lipomatosis. No canal or lateral recess stenosis. Foramina remain patent. No impingement. IMPRESSION: 1. Small left foraminal disc protrusion at L4-5, closely approximating and potentially irritating the exiting left L4 nerve root. 2. Mild left greater than right L4 foraminal stenosis related to disc bulge and facet disease. 3. Additional minor for age degenerative disc disease elsewhere within the lumbar spine without significant stenosis or neural impingement. No other findings to explain patient's right lower extremity symptoms identified. 4. Diffusely decreased T1 marrow signal intensity, nonspecific, but most commonly related to anemia, smoking, or obesity. Correlation with history and laboratory values recommended. Electronically Signed   By: Jeannine Boga M.D.   On: 06/18/2019 21:06   DG Chest Port 1 View  Result Date: 06/25/2019 CLINICAL DATA:  Shortness of breath EXAM: PORTABLE CHEST 1 VIEW COMPARISON:  June 23, 2019 FINDINGS: There is increase in  airspace opacity in the left base with small left pleural effusion. There is also airspace opacity in the lateral right base with small right pleural effusion. Heart is mildly enlarged with pulmonary vascularity normal. No adenopathy. There is aortic atherosclerosis. No bone lesions. IMPRESSION: Airspace opacity in both lower lung regions, more on the left on the right with small pleural effusions bilaterally. Heart mildly enlarged. Pulmonary vascularity normal. No adenopathy. Aortic Atherosclerosis (ICD10-I70.0). Electronically Signed   By: Lowella Grip III M.D.   On: 06/25/2019 08:12   DG CHEST PORT 1 VIEW  Result Date: 06/23/2019 CLINICAL DATA:  COVID-19 EXAM: PORTABLE CHEST 1 VIEW COMPARISON:  Two days ago FINDINGS: Similar extent of bilateral pulmonary infiltrate that is greater on the left. There may be mild progression at the right base. Normal heart size. No effusion or pneumothorax. IMPRESSION: Bilateral pneumonia which is stable or minimally progressed on the right. Electronically Signed   By: Monte Fantasia M.D.   On: 06/23/2019 08:59   DG CHEST PORT 1 VIEW  Result Date: 06/21/2019 CLINICAL DATA:  Hypoxemia and chest pain.  COVID 19 positive. EXAM: PORTABLE CHEST 1 VIEW COMPARISON:  06/19/2019 FINDINGS: Lungs are hypoinflated and demonstrate mild hazy opacification over the left base/retrocardiac region unchanged. No significant effusion. Cardiomediastinal silhouette and remainder of the exam is unchanged. IMPRESSION: Stable hazy opacification over the left base/retrocardiac region. Electronically Signed   By: Marin Olp M.D.   On: 06/21/2019 10:10   DG Chest Port 1 View  Result Date: 06/19/2019 CLINICAL DATA:  Cough. Right-sided weakness. EXAM: PORTABLE CHEST 1 VIEW COMPARISON:  06/17/2019 FINDINGS: There is a new focal area of atelectasis or infiltrate at the left lung base posterior medially. Minimal atelectasis at the right lung base. Lungs are otherwise clear. No effusions.  Heart size and pulmonary vascularity are normal. No bone abnormality. IMPRESSION: 1. New focal area of atelectasis or infiltrate at the left lung base posterior medially. 2. Minimal new atelectasis at the right lung base. Electronically Signed   By: Lorriane Shire M.D.   On: 06/19/2019 16:46   DG Chest Port 1 View  Result Date: 06/17/2019 CLINICAL DATA:  Chest pain EXAM: PORTABLE CHEST 1 VIEW COMPARISON:  June 05, 2019 FINDINGS: The heart size and mediastinal contours are within normal limits. Both lungs are clear. The visualized skeletal structures are  unremarkable. IMPRESSION: No active disease. Electronically Signed   By: Prudencio Pair M.D.   On: 06/17/2019 12:12   DG Chest Portable 1 View  Result Date: 06/05/2019 CLINICAL DATA:  Chest pain and shortness of breath. EXAM: PORTABLE CHEST 1 VIEW COMPARISON:  01/23/2019 FINDINGS: The heart size and mediastinal contours are within normal limits. Both lungs are clear. The visualized skeletal structures are unremarkable. IMPRESSION: Normal exam. Electronically Signed   By: Lorriane Shire M.D.   On: 06/05/2019 18:08   DG HIP UNILAT WITH PELVIS 1V RIGHT  Result Date: 06/05/2019 CLINICAL DATA:  Right hip pain EXAM: DG HIP (WITH OR WITHOUT PELVIS) 1V RIGHT COMPARISON:  None. FINDINGS: There is no evidence of hip fracture or dislocation. There is no evidence of arthropathy or other focal bone abnormality. IMPRESSION: Negative. Electronically Signed   By: Rolm Baptise M.D.   On: 06/05/2019 20:36

## 2019-06-28 NOTE — Progress Notes (Signed)
   06/28/19 1131  Vitals  Temp 97.9 F (36.6 C)  Temp Source Oral  BP 98/70  MAP (mmHg) 81  BP Location Left Arm  BP Method Automatic  Patient Position (if appropriate) Sitting  Pulse Rate 89  Pulse Rate Source Monitor  Cardiac Rhythm NSR  Resp (!) 23  Level of Consciousness  Level of Consciousness Alert  Oxygen Therapy  SpO2 92 %  O2 Device Nasal Cannula  O2 Flow Rate (L/min) 1.5 L/min  [place]ew[place] yellow Dr Merita Norton notified Reassessed the BP 111/60 patient is up to the chair and encouraged to work with the incentive spirometer. Patient stated that he feels much better but tires easy.

## 2019-06-28 NOTE — Progress Notes (Signed)
Call placed to the patient's sister with updates. No answer, message left on the answering machine

## 2019-06-29 LAB — CBC WITH DIFFERENTIAL/PLATELET
Abs Immature Granulocytes: 0.09 10*3/uL — ABNORMAL HIGH (ref 0.00–0.07)
Basophils Absolute: 0 10*3/uL (ref 0.0–0.1)
Basophils Relative: 0 %
Eosinophils Absolute: 0.1 10*3/uL (ref 0.0–0.5)
Eosinophils Relative: 1 %
HCT: 44.8 % (ref 39.0–52.0)
Hemoglobin: 15.5 g/dL (ref 13.0–17.0)
Immature Granulocytes: 1 %
Lymphocytes Relative: 21 %
Lymphs Abs: 2.3 10*3/uL (ref 0.7–4.0)
MCH: 30.1 pg (ref 26.0–34.0)
MCHC: 34.6 g/dL (ref 30.0–36.0)
MCV: 87 fL (ref 80.0–100.0)
Monocytes Absolute: 0.6 10*3/uL (ref 0.1–1.0)
Monocytes Relative: 5 %
Neutro Abs: 8.2 10*3/uL — ABNORMAL HIGH (ref 1.7–7.7)
Neutrophils Relative %: 72 %
Platelets: 417 10*3/uL — ABNORMAL HIGH (ref 150–400)
RBC: 5.15 MIL/uL (ref 4.22–5.81)
RDW: 11.7 % (ref 11.5–15.5)
WBC: 11.3 10*3/uL — ABNORMAL HIGH (ref 4.0–10.5)
nRBC: 0 % (ref 0.0–0.2)

## 2019-06-29 LAB — COMPREHENSIVE METABOLIC PANEL
ALT: 23 U/L (ref 0–44)
AST: 23 U/L (ref 15–41)
Albumin: 2.6 g/dL — ABNORMAL LOW (ref 3.5–5.0)
Alkaline Phosphatase: 54 U/L (ref 38–126)
Anion gap: 13 (ref 5–15)
BUN: 26 mg/dL — ABNORMAL HIGH (ref 6–20)
CO2: 25 mmol/L (ref 22–32)
Calcium: 8.6 mg/dL — ABNORMAL LOW (ref 8.9–10.3)
Chloride: 96 mmol/L — ABNORMAL LOW (ref 98–111)
Creatinine, Ser: 1 mg/dL (ref 0.61–1.24)
GFR calc Af Amer: >60 mL/min (ref >60)
GFR calc non Af Amer: >60 mL/min (ref >60)
Glucose, Bld: 143 mg/dL — ABNORMAL HIGH (ref 70–99)
Potassium: 4.7 mmol/L (ref 3.5–5.1)
Sodium: 134 mmol/L — ABNORMAL LOW (ref 135–145)
Total Bilirubin: 0.4 mg/dL (ref 0.3–1.2)
Total Protein: 5.6 g/dL — ABNORMAL LOW (ref 6.5–8.1)

## 2019-06-29 LAB — GLUCOSE, CAPILLARY
Glucose-Capillary: 125 mg/dL — ABNORMAL HIGH (ref 70–99)
Glucose-Capillary: 265 mg/dL — ABNORMAL HIGH (ref 70–99)

## 2019-06-29 LAB — C-REACTIVE PROTEIN: CRP: 1 mg/dL — ABNORMAL HIGH (ref <1.0)

## 2019-06-29 LAB — BRAIN NATRIURETIC PEPTIDE: B Natriuretic Peptide: 44.1 pg/mL (ref 0.0–100.0)

## 2019-06-29 LAB — D-DIMER, QUANTITATIVE: D-Dimer, Quant: 1.93 ug/mL-FEU — ABNORMAL HIGH (ref 0.00–0.50)

## 2019-06-29 LAB — MAGNESIUM: Magnesium: 2 mg/dL (ref 1.7–2.4)

## 2019-06-29 MED ORDER — LIVING WELL WITH DIABETES BOOK
Freq: Once | Status: AC
  Filled 2019-06-29: qty 1

## 2019-06-29 MED ORDER — DEXAMETHASONE SODIUM PHOSPHATE 4 MG/ML IJ SOLN
1.0000 mg | INTRAMUSCULAR | Status: AC
  Administered 2019-06-29: 1 mg via INTRAVENOUS
  Filled 2019-06-29: qty 1

## 2019-06-29 MED ORDER — MAGNESIUM HYDROXIDE 400 MG/5ML PO SUSP
30.0000 mL | Freq: Two times a day (BID) | ORAL | Status: AC
  Administered 2019-06-29 (×2): 30 mL via ORAL
  Filled 2019-06-29 (×2): qty 30

## 2019-06-29 MED ORDER — POLYETHYLENE GLYCOL 3350 17 G PO PACK
17.0000 g | PACK | Freq: Two times a day (BID) | ORAL | Status: DC
  Administered 2019-06-29 – 2019-06-30 (×3): 17 g via ORAL
  Filled 2019-06-29 (×5): qty 1

## 2019-06-29 MED ORDER — FUROSEMIDE 10 MG/ML IJ SOLN
40.0000 mg | Freq: Once | INTRAMUSCULAR | Status: AC
  Administered 2019-06-29: 40 mg via INTRAVENOUS
  Filled 2019-06-29: qty 4

## 2019-06-29 NOTE — Progress Notes (Signed)
PROGRESS NOTE                                                                                                                                                                                                             Patient Demographics:    Calvin [name], is a 58 y.o. male, DOB - Jan 06, 1961, VHQ:[id number]  Outpatient Primary MD for the patient is [name], NP    LOS - 10  Admit date - 06/17/2019    Chief Complaint  Patient presents with  . 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  He was found to have SIRS coronavirus 2 infection, he developed some hypoxic respiratory failure was treated with steroids, remdesivir, convalescent plasma and Actemra at [facility].  He was also evaluated by neurosurgery.  Subsequently sent to [facility] on 06/25/2019.   Subjective:   Patient in bed, appears comfortable, denies any headache, no fever, no chest pain or pressure, no shortness of breath , no abdominal pain. No focal weakness. No focal weakness.   Assessment  & Plan :     1. Acute Hypoxic Resp. Failure due to Acute Covid 19 Viral Pneumonitis during the ongoing 2020 Covid 19 Pandemic - he has moderate disease he has received appropriate treatment with steroids, remdesivir, convalescent plasma and Actemra at [facility].  He also had some element of CHF, will continue steroids and start to diurese.  Increase activity, add I-S and flutter valve.  Clinically he is improving now down to 2 L nasal cannula oxygen, in no respiratory distress, continue tapering down steroids, advancing activity and tapering oxygen down.  Encouraged the patient to sit up in  chair in the daytime use I-S and flutter valve for pulmonary toiletry and then prone in bed when at night.  SpO2: 93 % O2 Flow Rate (L/min): 1.5 L/min FiO2 (%): 98 %  Recent Labs  Lab 06/23/19 0857 06/24/19 0316 06/25/19 0500 06/25/19 0750 06/26/19 0200 06/27/19 0205 06/28/19 1100 06/28/19 1240 06/29/19 0250  CRP  --  21.3* 11.3* 9.4* 6.1* 2.5* 0.8  --  1.0*  DDIMER  --  1.01*  --   --  1.46* 2.75*  --  2.38* 1.93*  BNP  --   --   --  59.8 49.5 38.8  --  103.5* 44.1  PROCALCITON 0.21 0.22 0.11 <0.10  --   --   --   --   --     Hepatic Function Latest Ref Rng & Units 06/29/2019 06/27/2019 06/26/2019  Total Protein 6.5 - 8.1 g/dL 5.6(L) 6.3(L) 6.7  Albumin 3.5 - 5.0 g/dL 2.6(L) 2.7(L) 2.8(L)  AST 15 - 41 U/L 23 29 32  ALT 0 - 44 U/L _0 Alk Phosphatase 38 - 126 U/L 54 63 66  Total Bilirubin 0.3 - 1.2 mg/dL 0.4 0.5 0.5  Bilirubin, Direct 0.00 - 0.40 mg/dL - - -    2.  Acute on chronic right-sided lower back pain with radiation to right lower extremity and some right lower extremity weakness acute on chronic- previous MD had discussed the case and his MRI findings with neurosurgery, no further input, supportive care, weakness and pain improved.  Commence PT OT.  3.  Combined acute on chronic systolic and diastolic heart failure.  EF around 40% on echocardiogram done in 01/2019.  Continue Coreg, continue IV Lasix.  4.  Dyslipidemia.  Continue Zetia.  5.  Essential hypertension.  On Lasix and Coreg, ACE inhibitor on hold as he had developed mild AKI.   6. Constipation - B regimen started.  7. Mild AKI on CKD 3.  Baseline creatinine anywhere between 1.15-1.3. Improving with diuresis.  8. DM2 -was on Lantus, premeal NovoLog and sliding scale.  Early morning hypoglycemia, dose adjusted, will get DM and insulin education prior to discharge as he had poor outpatient glycemic control due to hyperglycemia.  Lab Results  Component Value Date   HGBA1C 11.9 (H) 06/17/2019    CBG (last 3)  Recent Labs    06/28/19 1557 06/28/19 2144 06/29/19 0714  GLUCAP 386* 329* 125*      Condition - Fair  Family Communication  :   Sister Lelan Pons called on her listed cell phone number on 06/25/2019 at 10:20 AM.  Message left.  She later called back the same day and she was updated.  Called again on 06/25/2019 at 3:58 AM.  Again message left.  Called again on 06/27/2019 at 10:20 AM.  Message left again  Code Status :  Full  Diet :   Diet Order            Diet heart healthy/carb modified Room service appropriate? Yes; Fluid consistency: Thin  Diet effective now               Disposition Plan  :  TBD  Consults  : Previous MD discussed his case with neurosurgery.  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  PUD Prophylaxis : PPI  DVT Prophylaxis  :  Eliquis  Lab Results  Component Value Date   PLT 417 (H) 06/29/2019    Inpatient Medications  Scheduled Meds: . amitriptyline  20 mg Oral QHS  . apixaban  5 mg Oral BID  . vitamin C  500 mg Oral Daily  . aspirin EC  81 mg Oral Daily  . atorvastatin  40 mg Oral q1800  . carvedilol  6.25 mg Oral BID WC  . dexamethasone (  DECADRON) injection  1 mg Intravenous Q24H  . docusate sodium  100 mg Oral BID  . ezetimibe  10 mg Oral Daily  . feeding supplement (GLUCERNA SHAKE)  237 mL Oral BID BM  . insulin aspart  0-5 Units Subcutaneous QHS  . insulin aspart  0-9 Units Subcutaneous TID WC  . magnesium hydroxide  30 mL Oral BID  . pantoprazole  40 mg Oral Daily  . polyethylene glycol  17 g Oral BID  . senna-docusate  2 tablet Oral QHS  . Vitamin D (Ergocalciferol)  50,000 Units Oral Q7 days  . zinc sulfate  220 mg Oral Daily   Continuous Infusions: PRN Meds:.albuterol, alum & mag hydroxide-simeth, dextromethorphan-guaiFENesin, morphine injection,  nitroGLYCERIN, [DISCONTINUED] ondansetron **OR** ondansetron (ZOFRAN) IV, traMADol  Antibiotics  :    Anti-infectives (From admission, onward)   Start     Dose/Rate Route Frequency Ordered Stop   06/23/19 1000  remdesivir 100 mg in sodium chloride 0.9 % 100 mL IVPB     100 mg 200 mL/hr over 30 Minutes Intravenous Daily 06/23/19 0750 06/23/19 0850   06/20/19 1000  remdesivir 100 mg in sodium chloride 0.9 % 100 mL IVPB  Status:  Discontinued     100 mg 200 mL/hr over 30 Minutes Intravenous Daily 06/19/19 1321 06/23/19 0750   06/19/19 1330  remdesivir 200 mg in sodium chloride 0.9% 250 mL IVPB     200 mg 580 mL/hr over 30 Minutes Intravenous Once 06/19/19 1321 06/19/19 1539       Time Spent in minutes  30   Lala Lund M.D on 06/29/2019 at 9:42 AM  To page go to [email or web address] - password [facility]  [facility] -  Office  [phone]  See all Orders from today for further details    Objective:   Vitals:   06/29/19 0000 06/29/19 0400 06/29/19 0438 06/29/19 0752  BP: 122/69 127/71  124/75  Pulse: 91 90  88  Resp: 19 18  (!) 22  Temp: 98.5 F (36.9 C) 98.2 F (36.8 C)  97.9 F (36.6 C)  TempSrc: Oral Oral  Oral  SpO2: 95% 93%    Weight:   70.2 kg   Height:        Wt Readings from Last 3 Encounters:  06/29/19 70.2 kg  06/19/19 79.4 kg  05/27/19 74.8 kg     Intake/Output Summary (Last 24 hours) at 06/29/2019 0942 Last data filed at 06/29/2019 0000 Gross per 24 hour  Intake 1020 ml  Output 1550 ml  Net -530 ml     Physical Exam  Awake Alert,  No new F.N deficits, Normal affect Lena.AT,PERRAL Supple Neck,No JVD, No cervical lymphadenopathy appriciated.  Symmetrical Chest wall movement, Good air movement bilaterally, CTAB RRR,No Gallops, Rubs or new Murmurs, No Parasternal Heave +ve B.Sounds, Abd Soft, No tenderness, No organomegaly appriciated, No rebound - guarding or rigidity. No Cyanosis, Clubbing or edema, No new Rash or bruise   Data Review:     CBC Recent Labs  Lab 06/25/19 0750 06/26/19 0200 06/27/19 0205 06/28/19 1240 06/29/19 0250  WBC 13.1* 9.2 6.7 8.6 11.3*  HGB 14.8 16.6 16.3 16.3 15.5  HCT 43.0 49.3 47.8 48.3 44.8  PLT 370 526* 454* 416* 417*  MCV 87.8 88.5 87.5 89.6 87.0  MCH 30.2 29.8 29.9 30.2 30.1  MCHC 34.4 33.7 34.1 33.7 34.6  RDW 11.9 11.9 12.0 11.9 11.7  LYMPHSABS  --  1.4 1.3 1.2 2.3  MONOABS  --  0.3 0.2 0.3 0.6  EOSABS  --  0.2 0.1 0.1 0.1  BASOSABS  --  0.0 0.0 0.0 0.0    Chemistries  Recent Labs  Lab 06/23/19 0355 06/24/19 0316 06/25/19 0750 06/26/19 0200 06/27/19 0205 06/28/19 1240 06/28/19 1819 06/29/19 0250  NA 136 133* 133* 138 135  --  131* 134*  K 4.1 4.9 4.6 3.9 4.2  --  4.8 4.7  CL 100 100 99 97* 99  --  93* 96*  CO2 _0 --  26 25  GLUCOSE 67* 204* 187* 61* 87  --  364* 143*  BUN 21* 25* 33* 36* 35*  --  28* 26*  CREATININE 1.27* 1.23 1.22 1.38* 1.17  --  1.17 1.00  CALCIUM 8.4* 8.4* 8.6* 8.9 8.6*  --  8.6* 8.6*  MG  --   --   --  2.4 2.3 2.6*  --  2.0  AST 36 27  --  32 29  --   --  23  ALT 31 24  --  26 23  --   --  23  ALKPHOS 60 58  --  66 63  --   --  54  BILITOT 0.9 0.6  --  0.5 0.5  --   --  0.4   ------------------------------------------------------------------------------------------------------------------ No results for input(s): CHOL, HDL, LDLCALC, TRIG, CHOLHDL, LDLDIRECT in the last 72 hours.  Lab Results  Component Value Date   HGBA1C 11.9 (H) 06/17/2019   ------------------------------------------------------------------------------------------------------------------ No results for input(s): TSH, T4TOTAL, T3FREE, THYROIDAB in the last 72 hours.  Invalid input(s): FREET3  Cardiac Enzymes No results for input(s): CKMB, TROPONINI, MYOGLOBIN in the last 168 hours.  Invalid input(s): CK ------------------------------------------------------------------------------------------------------------------    Component Value Date/Time   BNP  44.1 06/29/2019 0250    Micro Results No results found for this or any previous visit (from the past 240 hour(s)).  Radiology Reports DG Abd 1 View  Result Date: 06/21/2019 CLINICAL DATA:  Abdominal pain. EXAM: ABDOMEN - 1 VIEW COMPARISON:  Scout image for CT scan of the abdomen dated 01/23/2019 FINDINGS: There is air scattered throughout the nondistended large and small bowel. Stomach is not distended. No abnormal abdominal calcifications. Bones are normal. IMPRESSION: Benign-appearing abdomen. Electronically Signed   By: Lorriane Shire M.D.   On: 06/21/2019 14:19   CT HEAD WO CONTRAST  Result Date: 06/17/2019 CLINICAL DATA:  Right-sided weakness, 2 days duration. Recent falling. EXAM: CT HEAD WITHOUT CONTRAST TECHNIQUE: Contiguous axial images were obtained from the base of the skull through the vertex without intravenous contrast. COMPARISON:  MRI 10/14/2017 FINDINGS: Brain: The brain shows a normal appearance without evidence of malformation, atrophy, old or acute small or large vessel infarction, mass lesion, hemorrhage, hydrocephalus or extra-axial collection. Vascular: There is atherosclerotic calcification of the major vessels at the base of the brain. Skull: Normal.  No traumatic finding.  No focal bone lesion. Sinuses/Orbits: Sinuses are clear. Orbits appear normal. Mastoids are clear. Other: None significant IMPRESSION: Normal head CT except for atherosclerotic calcification of the major vessels at the base of the brain. Electronically Signed   By: Nelson Chimes M.D.   On: 06/17/2019 14:41   MR BRAIN W WO CONTRAST  Result Date: 06/17/2019 CLINICAL DATA:  Neuro deficit. Subacute right-sided weakness. EXAM: MRI HEAD WITHOUT AND WITH CONTRAST TECHNIQUE: Multiplanar, multiecho pulse sequences of the brain and surrounding structures were obtained without and with intravenous contrast. CONTRAST:  43m GADAVIST GADOBUTROL 1 MMOL/ML IV SOLN COMPARISON:  CT head 06/17/2019.  MRI 10/14/2017  FINDINGS: Brain: No acute infarction, hemorrhage, hydrocephalus, extra-axial collection or mass lesion. No significant chronic ischemia Normal enhancement postcontrast administration Vascular: Normal arterial flow voids Skull and upper cervical spine: No focal skeletal lesion. Sinuses/Orbits: Mild mucosal edema paranasal sinuses. Normal orbit Other: None IMPRESSION: Negative MRI head with contrast. Electronically Signed   By: Franchot Gallo M.D.   On: 06/17/2019 19:01   MR THORACIC SPINE WO CONTRAST  Result Date: 06/20/2019 CLINICAL DATA:  Initial evaluation for mid back pain. EXAM: MRI THORACIC SPINE WITHOUT CONTRAST TECHNIQUE: Multiplanar, multisequence MR imaging of the thoracic spine was performed. No intravenous contrast was administered. COMPARISON:  None available. FINDINGS: Alignment: Vertebral bodies normally aligned with preservation of the normal thoracic kyphosis. No listhesis or subluxation. Vertebrae: Vertebral body height maintained without evidence for acute or chronic fracture. Bone marrow signal intensity within normal limits. Small benign hemangioma noted within the T9 vertebral body. No worrisome osseous lesions. No abnormal marrow edema. Cord: Signal intensity within the thoracic spinal cord is within normal limits. Paraspinal and other soft tissues: Paraspinal soft tissues demonstrate no acute finding. Patchy multifocal opacities noted within the partially visualized lungs, likely related history of COVID infection. Small T2 hyperintense simple right renal cyst noted. Visualized visceral structures otherwise unremarkable. Disc levels: T1-2:  Unremarkable. T2-3: Unremarkable. T3-4: Tiny left paracentral disc protrusion minimally indents the left ventral thecal sac. No significant stenosis or cord deformity. T4-5:  Mild disc bulge. No significant canal or foraminal stenosis. T5-6: Small central disc protrusion minimally indents the ventral thecal sac. No significant stenosis or cord  deformity. T6-7: Central/right paracentral disc protrusion indents the ventral thecal sac. Mild flattening of the ventral spinal cord without cord signal changes. No significant stenosis. T7-8: Central disc protrusion indents the ventral thecal sac, contacting and mildly flattening the ventral thoracic cord. Superimposed prominence of the dorsal epidural fat. No significant spinal stenosis. Foramina remain patent. T8-9: Small central disc protrusion indents the ventral thecal sac. Associated annular fissure. Minimal flattening of the ventral cord without cord signal changes. No significant stenosis. T9-10: Mild disc bulge with disc desiccation. Mild posterior element hypertrophy. No significant stenosis. T10-11:  Unremarkable. T11-12:  Unremarkable. T12-L1:  Unremarkable. IMPRESSION: 1. Multifocal disc protrusions involving the T3-4 through T9-10 interspaces as above, most prominent of which is positioned at the T7-8 level. No significant spinal stenosis. Findings could contribute to underlying back pain. 2. Patchy multifocal opacities within the partially visualized lungs, likely related to history of COVID infection. Electronically Signed   By: Jeannine Boga M.D.   On: 06/20/2019 00:04   MR LUMBAR SPINE WO CONTRAST  Result Date: 06/18/2019 CLINICAL DATA:  Initial evaluation for unilateral right lower extremity weakness, tingling in toes. EXAM: MRI LUMBAR SPINE WITHOUT CONTRAST TECHNIQUE: Multiplanar, multisequence MR imaging of the lumbar spine was performed. No intravenous contrast was administered. COMPARISON:  Prior radiograph from 01/18/2012. FINDINGS: Segmentation: Standard. Lowest well-formed disc space labeled the L5-S1 level. Alignment: Physiologic with preservation of the normal lumbar lordosis. No listhesis. Vertebrae: Vertebral body height maintained without evidence for acute or chronic fracture. Bone marrow signal intensity somewhat diffusely decreased on T1 weighted imaging, nonspecific,  but most commonly related to anemia, smoking, or obesity. Subcentimeter benign hemangioma noted within the L4 vertebral body. No other discrete or worrisome osseous lesions. No abnormal marrow edema. Conus medullaris and cauda equina: Conus extends to the L1 level. Conus and cauda equina appear normal. Paraspinal and other soft tissues: Paraspinous soft tissues within normal limits. Partially visualized  urinary bladder moderately distended. Visualized visceral structures otherwise unremarkable. Disc levels: L1-2: Disc desiccation without significant disc bulge. No canal or neural foraminal stenosis. No impingement. L2-3: Mild disc desiccation without significant disc bulge. No canal or neural foraminal stenosis. L3-4: Disc desiccation with minimal annular disc bulge. No significant canal or neural foraminal stenosis. No impingement. L4-5: Disc desiccation with minimal annular disc bulge. Superimposed small left foraminal disc protrusion closely approximates the exiting left L4 nerve root without frank neural impingement (series 4, image 11). No significant canal or lateral recess stenosis. Mild left greater than right L4 foraminal stenosis. L5-S1: Negative interspace. Mild epidural lipomatosis. No canal or lateral recess stenosis. Foramina remain patent. No impingement. IMPRESSION: 1. Small left foraminal disc protrusion at L4-5, closely approximating and potentially irritating the exiting left L4 nerve root. 2. Mild left greater than right L4 foraminal stenosis related to disc bulge and facet disease. 3. Additional minor for age degenerative disc disease elsewhere within the lumbar spine without significant stenosis or neural impingement. No other findings to explain patient's right lower extremity symptoms identified. 4. Diffusely decreased T1 marrow signal intensity, nonspecific, but most commonly related to anemia, smoking, or obesity. Correlation with history and laboratory values recommended. Electronically  Signed   By: Jeannine Boga M.D.   On: 06/18/2019 21:06   DG Chest Port 1 View  Result Date: 06/25/2019 CLINICAL DATA:  Shortness of breath EXAM: PORTABLE CHEST 1 VIEW COMPARISON:  June 23, 2019 FINDINGS: There is increase in airspace opacity in the left base with small left pleural effusion. There is also airspace opacity in the lateral right base with small right pleural effusion. Heart is mildly enlarged with pulmonary vascularity normal. No adenopathy. There is aortic atherosclerosis. No bone lesions. IMPRESSION: Airspace opacity in both lower lung regions, more on the left on the right with small pleural effusions bilaterally. Heart mildly enlarged. Pulmonary vascularity normal. No adenopathy. Aortic Atherosclerosis (ICD10-I70.0). Electronically Signed   By: Lowella Grip III M.D.   On: 06/25/2019 08:12   DG CHEST PORT 1 VIEW  Result Date: 06/23/2019 CLINICAL DATA:  COVID-19 EXAM: PORTABLE CHEST 1 VIEW COMPARISON:  Two days ago FINDINGS: Similar extent of bilateral pulmonary infiltrate that is greater on the left. There may be mild progression at the right base. Normal heart size. No effusion or pneumothorax. IMPRESSION: Bilateral pneumonia which is stable or minimally progressed on the right. Electronically Signed   By: Monte Fantasia M.D.   On: 06/23/2019 08:59   DG CHEST PORT 1 VIEW  Result Date: 06/21/2019 CLINICAL DATA:  Hypoxemia and chest pain.  COVID 19 positive. EXAM: PORTABLE CHEST 1 VIEW COMPARISON:  06/19/2019 FINDINGS: Lungs are hypoinflated and demonstrate mild hazy opacification over the left base/retrocardiac region unchanged. No significant effusion. Cardiomediastinal silhouette and remainder of the exam is unchanged. IMPRESSION: Stable hazy opacification over the left base/retrocardiac region. Electronically Signed   By: Marin Olp M.D.   On: 06/21/2019 10:10   DG Chest Port 1 View  Result Date: 06/19/2019 CLINICAL DATA:  Cough. Right-sided weakness. EXAM:  PORTABLE CHEST 1 VIEW COMPARISON:  06/17/2019 FINDINGS: There is a new focal area of atelectasis or infiltrate at the left lung base posterior medially. Minimal atelectasis at the right lung base. Lungs are otherwise clear. No effusions. Heart size and pulmonary vascularity are normal. No bone abnormality. IMPRESSION: 1. New focal area of atelectasis or infiltrate at the left lung base posterior medially. 2. Minimal new atelectasis at the right lung base. Electronically Signed  By: Lorriane Shire M.D.   On: 06/19/2019 16:46   DG Chest Port 1 View  Result Date: 06/17/2019 CLINICAL DATA:  Chest pain EXAM: PORTABLE CHEST 1 VIEW COMPARISON:  June 05, 2019 FINDINGS: The heart size and mediastinal contours are within normal limits. Both lungs are clear. The visualized skeletal structures are unremarkable. IMPRESSION: No active disease. Electronically Signed   By: Prudencio Pair M.D.   On: 06/17/2019 12:12   DG Chest Portable 1 View  Result Date: 06/05/2019 CLINICAL DATA:  Chest pain and shortness of breath. EXAM: PORTABLE CHEST 1 VIEW COMPARISON:  01/23/2019 FINDINGS: The heart size and mediastinal contours are within normal limits. Both lungs are clear. The visualized skeletal structures are unremarkable. IMPRESSION: Normal exam. Electronically Signed   By: Lorriane Shire M.D.   On: 06/05/2019 18:08   DG HIP UNILAT WITH PELVIS 1V RIGHT  Result Date: 06/05/2019 CLINICAL DATA:  Right hip pain EXAM: DG HIP (WITH OR WITHOUT PELVIS) 1V RIGHT COMPARISON:  None. FINDINGS: There is no evidence of hip fracture or dislocation. There is no evidence of arthropathy or other focal bone abnormality. IMPRESSION: Negative. Electronically Signed   By: Rolm Baptise M.D.   On: 06/05/2019 20:36

## 2019-06-29 NOTE — Progress Notes (Signed)
Inpatient Diabetes Program Recommendations  AACE/ADA: New Consensus Statement on Inpatient Glycemic Control (2015)  Target Ranges:  Prepandial:   less than 140 mg/dL      Peak postprandial:   less than 180 mg/dL (1-2 hours)      Critically ill patients:  140 - 180 mg/dL   Lab Results  Component Value Date   GLUCAP 125 (H) 06/29/2019   HGBA1C 11.9 (H) 06/17/2019    Review of Glycemic Control Results for Calvin [name], Calvin [name] (MRN [id number]) as of 06/29/2019 14:50  Ref. Range 06/28/2019 11:29 06/28/2019 15:57 06/28/2019 21:44 06/29/2019 07:14  Glucose-Capillary Latest Ref Range: 70 - 99 mg/dL 233 (H) 386 (H) 329 (H) 125 (H)  Diabetes history: DM2 Outpatient Diabetes medications: Tresiba 20 units daily Current orders for Inpatient glycemic control:  Novolog sensitive tid with meals and HS  Inpatient Diabetes Program Recommendations:    Note steroids reduced.  Blood sugars continue to be increased post-prandial.  Patient was on insulin prior to admit and Diabetes coordinator spoke with patient regarding home control and A1C on 06/18/19.  Will order LWWD booklet for patient and ask bedside RN to review insulin with patient when it is due.   Consider restarting Lantus 15 units daily and add Novolog 3 units tid w/ meals.    Thanks,  Adah Perl, RN, BC-ADM Inpatient Diabetes Coordinator Pager [phone] (8a-5p)

## 2019-06-29 NOTE — Progress Notes (Signed)
Pt did well overnight with no events.  Was able to wean pt off of oxygen.  Pulse ox readings stayed in the 92-94 range on room air.  Pt family member has concerns with pt returning home and thinks it would be best for him to go to a facility for some rehab before returning home.  She will call and discuss her concerns with the pt and mention it to the MD.

## 2019-06-29 NOTE — Progress Notes (Signed)
Patient was on room air and vitals stable. Put the patient in the chair respirations at 40 and sao2 60% 3 liters oxygen with Nasal canula Patient recovered  Now Sao2 91 and respirations 22. Dr Merita Norton notified

## 2019-06-29 NOTE — Progress Notes (Signed)
Call placed to the patient's sister . No answer message left on the answering machine.

## 2019-06-30 LAB — GLUCOSE, CAPILLARY
Glucose-Capillary: 262 mg/dL — ABNORMAL HIGH (ref 70–99)
Glucose-Capillary: 240 mg/dL — ABNORMAL HIGH (ref 70–99)
Glucose-Capillary: 182 mg/dL — ABNORMAL HIGH (ref 70–99)
Glucose-Capillary: 255 mg/dL — ABNORMAL HIGH (ref 70–99)

## 2019-06-30 MED ORDER — POLYETHYLENE GLYCOL 3350 17 G PO PACK: 17.0000 g | PACK | Freq: Two times a day (BID) | ORAL | 0 refills | Status: DC

## 2019-06-30 MED ORDER — MAGNESIUM HYDROXIDE 400 MG/5ML PO SUSP
30.0000 mL | Freq: Two times a day (BID) | ORAL | Status: AC
  Administered 2019-06-30: 30 mL via ORAL
  Filled 2019-06-30 (×2): qty 30

## 2019-06-30 MED ORDER — SENNA 8.6 MG PO TABS: 1.0000 | ORAL_TABLET | Freq: Every day | ORAL | 0 refills | Status: DC | PRN

## 2019-06-30 MED ORDER — SENNOSIDES-DOCUSATE SODIUM 8.6-50 MG PO TABS
1.0000 | ORAL_TABLET | Freq: Two times a day (BID) | ORAL | Status: AC
  Administered 2019-06-30: 1 via ORAL
  Filled 2019-06-30 (×2): qty 1

## 2019-06-30 MED ORDER — ALBUTEROL SULFATE HFA 108 (90 BASE) MCG/ACT IN AERS: 2.0000 | INHALATION_SPRAY | Freq: Four times a day (QID) | RESPIRATORY_TRACT | 0 refills | Status: DC | PRN

## 2019-06-30 NOTE — NC FL2 (Signed)
[place] LEVEL OF CARE SCREENING TOOL     IDENTIFICATION  Patient Name: Calvin [name] Birthdate: 09/23/1960 Sex: male Admission Date (Current Location): 06/17/2019  [facility] and [place] Number:  Herbalist and Address:  [facility] [place], [place] [address], [place], [place])      Provider Number: [id number]  Attending Physician Name and Address:  Thurnell Lose, MD  Relative Name and Phone Number:       Current Level of Care: Hospital Recommended Level of Care: [place] Prior Approval Number:    Date Approved/Denied:   PASRR Number: [id number]  Discharge Plan: SNF    Current Diagnoses: Patient Active Problem List   Diagnosis Date Noted  . COVID-19 virus detected 06/18/2019  . Right sided weakness 06/17/2019  . AKI (acute kidney injury) ([place]) 06/17/2019  . Foot pain, bilateral 02/01/2019  . Abdominal pain 01/23/2019  . Chest pain 01/23/2019  . Right leg pain 12/20/2018  . Overgrown toenails 12/20/2018  . Unsteady gait 12/20/2018  . Encounter for therapeutic drug monitoring 10/22/2018  . Nausea and vomiting 03/10/2018  . AF (paroxysmal atrial fibrillation) ([place]) 03/10/2018  . Non-cardiac chest pain 03/09/2018  . Diabetic neuropathy ([place]) 09/26/2017  . MCI (mild cognitive impairment) 06/23/2017  . OSA on CPAP 05/10/2017  . Gastroesophageal reflux disease 05/10/2017  . Insomnia 05/10/2017  . Onychomycosis of multiple toenails with type 2 diabetes mellitus and peripheral neuropathy ([place]) 05/10/2017  . Chronic systolic heart failure ([place]) 11/22/2015  . Essential hypertension 11/22/2015  . DM (diabetes mellitus) ([place]) 11/20/2015  . HLD (hyperlipidemia) 11/20/2015  . Hx of adenomatous colonic polyps 05/19/2011    Orientation RESPIRATION BLADDER Height & Weight     Self, Time, Situation, Place  Normal Continent Weight: 154 lb 12.2 oz (70.2 kg) Height:  5' 4"  (162.6 cm)   BEHAVIORAL SYMPTOMS/MOOD NEUROLOGICAL BOWEL NUTRITION STATUS      Continent Diet(heart healthy/carb modified)  AMBULATORY STATUS COMMUNICATION OF NEEDS Skin   Limited Assist Verbally Normal                       Personal Care Assistance Level of Assistance  Bathing, Feeding, Dressing Bathing Assistance: Limited assistance Feeding assistance: Independent Dressing Assistance: Limited assistance     Functional Limitations Info  Sight Sight Info: Impaired        SPECIAL CARE FACTORS FREQUENCY  PT (By licensed PT), OT (By licensed OT)     PT Frequency: 5x/wk OT Frequency: 5x/wk            Contractures Contractures Info: Not present    Additional Factors Info  Code Status, Allergies, Insulin Sliding Scale Code Status Info: 5x/wk Allergies Info: 5x/wk   Insulin Sliding Scale Info: 0-9 units 3x/day with meals; 0-5 units daily at bed       Current Medications (06/30/2019):  This is the current hospital active medication list Current Facility-Administered Medications  Medication Dose Route Frequency Provider Last Rate Last Admin  . albuterol (VENTOLIN HFA) 108 (90 Base) MCG/ACT inhaler 1-2 puff  1-2 puff Inhalation Q4H PRN Chesley Mires, MD      . alum & mag hydroxide-simeth (MAALOX/MYLANTA) 200-200-20 MG/5ML suspension 30 mL  30 mL Oral Q6H PRN Regalado, Belkys A, MD   30 mL at 06/21/19 1004  . amitriptyline (ELAVIL) tablet 20 mg  20 mg Oral QHS Mariel Aloe, MD   20 mg at 06/29/19 2046  . apixaban (ELIQUIS)  tablet 5 mg  5 mg Oral BID Damita Lack, MD   5 mg at 06/30/19 0928  . ascorbic acid (VITAMIN C) tablet 500 mg  500 mg Oral Daily Alfonzo Feller, NP   500 mg at 06/30/19 2353  . aspirin EC tablet 81 mg  81 mg Oral Daily Regalado, Belkys A, MD   81 mg at 06/30/19 0927  . atorvastatin (LIPITOR) tablet 40 mg  40 mg Oral q1800 Regalado, Belkys A, MD   40 mg at 06/29/19 1838  . carvedilol (COREG) tablet 6.25 mg  6.25 mg Oral BID WC Mariel Aloe, MD   6.25  mg at 06/30/19 6144  . dextromethorphan-guaiFENesin (MUCINEX DM) 30-600 MG per 12 hr tablet 1 tablet  1 tablet Oral BID PRN Regalado, Belkys A, MD   1 tablet at 06/23/19 1103  . docusate sodium (COLACE) capsule 100 mg  100 mg Oral BID Amin, Ankit Chirag, MD   100 mg at 06/30/19 0927  . ezetimibe (ZETIA) tablet 10 mg  10 mg Oral Daily Mariel Aloe, MD   10 mg at 06/30/19 3154  . feeding supplement (GLUCERNA SHAKE) (GLUCERNA SHAKE) liquid 237 mL  237 mL Oral BID BM Regalado, Belkys A, MD   237 mL at 06/30/19 0929  . insulin aspart (novoLOG) injection 0-5 Units  0-5 Units Subcutaneous QHS Thurnell Lose, MD   3 Units at 06/29/19 2047  . insulin aspart (novoLOG) injection 0-9 Units  0-9 Units Subcutaneous TID WC Thurnell Lose, MD   2 Units at 06/30/19 [phone]  . magnesium hydroxide (MILK OF MAGNESIA) suspension 30 mL  30 mL Oral BID Lala Lund K, MD      . morphine 2 MG/ML injection 1 mg  1 mg Intravenous Q4H PRN Regalado, Belkys A, MD   1 mg at 06/26/19 1657  . nitroGLYCERIN (NITROSTAT) SL tablet 0.4 mg  0.4 mg Sublingual Q5 min PRN Regalado, Belkys A, MD   0.4 mg at 06/21/19 0952  . ondansetron (ZOFRAN) injection 4 mg  4 mg Intravenous Q6H PRN Damita Lack, MD   4 mg at 06/19/19 0837  . pantoprazole (PROTONIX) EC tablet 40 mg  40 mg Oral Daily Thurnell Lose, MD   40 mg at 06/30/19 7619  . polyethylene glycol (MIRALAX / GLYCOLAX) packet 17 g  17 g Oral BID Thurnell Lose, MD   17 g at 06/30/19 0927  . senna-docusate (Senokot-S) tablet 1 tablet  1 tablet Oral BID Thurnell Lose, MD      . traMADol Veatrice Bourbon) tablet 50 mg  50 mg Oral Q6H PRN Regalado, Belkys A, MD   50 mg at 06/23/19 1112  . Vitamin D (Ergocalciferol) (DRISDOL) capsule 50,000 Units  50,000 Units Oral Q7 days Regalado, Belkys A, MD   50,000 Units at 06/29/19 1036  . zinc sulfate capsule 220 mg  220 mg Oral Daily Alfonzo Feller, NP   220 mg at 06/30/19 5093     Discharge Medications: Please see discharge  summary for a list of discharge medications.  Relevant Imaging Results:  Relevant Lab Results:   Additional Information SS#: [id number]  Geralynn Ochs, LCSW
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
## 2019-06-30 NOTE — Progress Notes (Signed)
Received Pt from 1E. Pt condition unchanged. AAOx4. NAD noted. Stable on RA sitting up in chair. Vitals WDL. No complaints at this time. Oriented to room and unit. Care transferred without incident, will continue to monitor.

## 2019-06-30 NOTE — Discharge Summary (Signed)
Calvin [name] XAJ:[id number] DOB: May 04, 1961 DOA: 06/17/2019  PCP: [name], NP  Admit date: 06/17/2019  Discharge date: 06/30/2019  Admitted From: Home  Disposition:  Home   Recommendations for Outpatient Follow-up:   Follow up with PCP in 1-2 weeks  PCP Please obtain BMP/CBC, 2 view CXR in 1week,  (see Discharge instructions)   PCP Please follow up on the following pending results: Needs outpatient neurosurgery follow-up for back pain and chronic spine disc disease.   Home Health: PR, RN, social work Equipment/Devices: Conservation officer, nature Consultations: None  Discharge Condition: Stable    CODE STATUS: Full    Diet Recommendation: Heart Healthy, low carbohydrate.  1.5 L/day total fluid restriction.   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 He was found to have SIRS coronavirus 2 infection, he developed some hypoxic respiratory failure was treated with steroids, remdesivir, convalescent plasma and Actemra at [facility].  He was also evaluated by neurosurgery.  Subsequently sent to [facility] on 06/25/2019.                                                                  Hospital Course   1. Acute Hypoxic Resp. Failure due to Acute Covid 19 Viral Pneumonitis during the ongoing 2020 Covid 19 Pandemic - he had moderate disease he has received appropriate treatment with steroids, remdesivir, convalescent plasma and  Actemra at [facility].  He is considerably improved and now symptom-free on room air currently, eager to go home, will be discharged home with albuterol as needed rescue inhaler and outpatient PCP follow-up, home PT, RN also ordered upon discharge.   Recent Labs  Lab 06/24/19 0316 06/25/19 0500 06/25/19 0750 06/26/19 0200 06/27/19 0205 06/28/19 1100 06/28/19 1240 06/29/19 0250  CRP 21.3* 11.3* 9.4* 6.1* 2.5* 0.8  --  1.0*  DDIMER 1.01*  --   --  1.46* 2.75*  --  2.38* 1.93*  BNP  --   --  59.8 49.5 38.8  --  103.5* 44.1  PROCALCITON 0.22 0.11 <0.10  --   --   --   --   --     Hepatic Function Latest Ref Rng & Units 06/29/2019 06/27/2019 06/26/2019  Total Protein 6.5 - 8.1 g/dL 5.6(L) 6.3(L) 6.7  Albumin 3.5 - 5.0 g/dL 2.6(L) 2.7(L) 2.8(L)  AST 15 - 41 U/L 23 29 32  ALT 0 - 44 U/L _0 Alk Phosphatase 38 - 126 U/L 54 63 66  Total Bilirubin 0.3 - 1.2 mg/dL 0.4 0.5 0.5  Bilirubin, Direct 0.00 - 0.40 mg/dL - - -    2.  Acute on chronic right-sided lower back pain with radiation to right lower extremity and some right lower extremity weakness acute on chronic- previous MD had discussed the case and his MRI findings with neurosurgery, no further input, supportive care, weakness and pain improved.  Received PT OT which she will get at home and will follow with neurosurgery outpatient.  PCP to assist in referral.  3.  Combined acute on chronic systolic and diastolic heart failure.  EF around 40% on echocardiogram done in 01/2019.  Continue Coreg, ACE inhibitor and home regimen unchanged.  4.  Dyslipidemia.  Continue Zetia.  5.  Essential hypertension.  On Lasix and Coreg, ACE inhibitor on hold as he had developed mild AKI.   6. Constipation - B regimen started.  7. Mild AKI on CKD 3.  Baseline creatinine anywhere between 1.15-1.3. Improving with diuresis.  8.  Paroxysmal A. fib.  Mali vas 2 score of at least 3.  Continue beta-blocker and Eliquis as before.    9. DM2  -continue home regimen and follow with PCP for glycemic control, he received insulin and diabetic education here by diabetic coordinator,  DM and insulin education prior to discharge as he had poor outpatient glycemic control due to hyperglycemia.  Lab Results  Component Value Date   HGBA1C 11.9 (H) 06/17/2019     Discharge diagnosis     Principal Problem:   Right sided weakness Active Problems:   DM (diabetes mellitus) (HCC)   HLD (hyperlipidemia)   Chronic systolic heart failure (HCC)   Essential hypertension   OSA on CPAP   Onychomycosis of multiple toenails with type 2 diabetes mellitus and peripheral neuropathy (HCC)   MCI (mild cognitive impairment)   Diabetic neuropathy (HCC)   AF (paroxysmal atrial fibrillation) (HCC)   AKI (acute kidney injury) ([place])   COVID-19 virus detected    Discharge instructions    Discharge Instructions    Discharge instructions   Complete by: As directed    Follow with Primary MD [name], NP in 7 days   Get CBC, CMP, 2 view Chest X ray -  checked next visit within 1 week by Primary MD or SNF MD ( we routinely change or add medications that can affect your baseline labs and fluid status, therefore we recommend that you get the mentioned basic workup next visit with your PCP, your PCP may decide not to get them or add new tests based on their clinical decision)  Activity: As tolerated with Full fall precautions use walker/cane & assistance as needed  Disposition Home   Diet: Heart Healthy Low Carb, 1.5lit/day fluid restriction  Accuchecks 4 times/day, Once in AM empty stomach and then before each meal. Log in all results and show them to your Prim.MD in 3 days. If  any glucose reading is under 80 or above 300 call your Prim MD immidiately. Follow Low glucose instructions for glucose under 80 as instructed.    Special Instructions: If you have smoked or chewed Tobacco  in the last 2 yrs please stop smoking, stop any regular  Alcohol  and or any Recreational drug use.  On your next visit with your primary care physician please Get Medicines reviewed and adjusted.  Please request your Prim.MD to go over all Hospital Tests and Procedure/Radiological results at the follow up, please get all Hospital records sent to your Prim MD by signing hospital release before you go home.  If you experience worsening of your admission symptoms, develop shortness of breath, life threatening emergency, suicidal or homicidal thoughts you must seek medical attention immediately by calling 911 or calling your MD immediately  if symptoms less severe.  You Must read complete instructions/literature along with all the possible adverse reactions/side effects for all the Medicines you take and that have been prescribed to you. Take any new Medicines after you have completely understood and accpet all the possible adverse reactions/side effects.   Increase activity slowly   Complete by: As directed    MyChart COVID-19 home monitoring program   Complete by: Jun 30, 2019    Is the patient willing to use the [place] for home monitoring?: Yes   Temperature monitoring   Complete by: Jun 30, 2019    After how many days would you like to receive a notification of this patient's flowsheet entries?: 1      Discharge Medications   Allergies as of 06/30/2019   No Known Allergies     Medication List    TAKE these medications   albuterol 108 (90 Base) MCG/ACT inhaler Commonly known as: VENTOLIN HFA Inhale 2 puffs into the lungs every 6 (six) hours as needed for wheezing or shortness of breath.   amitriptyline 10 MG tablet Commonly known as: ELAVIL Take 1 tablet at bed time for one week and then increase 2 tablets at bed time What changed:   how much to take  how to take this  when to take this  additional instructions   apixaban 5 MG Tabs tablet Commonly known as: ELIQUIS Take 5 mg by mouth 2 (two) times daily.     carvedilol 6.25 MG tablet Commonly known as: COREG Take 1 tablet (6.25 mg total) by mouth 2 (two) times daily with a meal.   Contour Test test strip Generic drug: glucose blood 1 each by Other route 2 (two) times a day.   donepezil 10 MG tablet Commonly known as: ARICEPT Take 1 tablet (10 mg total) by mouth at bedtime.   ezetimibe 10 MG tablet Commonly known as: ZETIA Take 1 tablet (10 mg total) by mouth daily.   INSULIN SYRINGE .3CC/29GX1/2" 29G X 1/2" 0.3 ML Misc Inject 1 Syringe 3 (three) times daily as directed. Check blood sugar three times daily. Dx: E11.9   lisinopril 10 MG tablet Commonly known as: ZESTRIL Take 1 tablet (10 mg total) by mouth at bedtime.   nitroGLYCERIN 0.4 MG SL tablet Commonly known as: NITROSTAT Place 1 tablet (0.4 mg total) under the tongue every 5 (five) minutes as needed for chest pain.   polyethylene glycol 17 g packet Commonly known as: MIRALAX / GLYCOLAX Take 17 g by mouth 2 (two) times daily.   Repatha 140 MG/ML Sosy Generic drug: Evolocumab Inject 140 mg into the skin every 14 (fourteen) days.  senna 8.6 MG Tabs tablet Commonly known as: SENOKOT Take 1 tablet (8.6 mg total) by mouth daily as needed for mild constipation.   [name] FlexTouch 200 UNIT/ML Sopn Generic drug: Insulin Degludec Inject 220 Units into the skin daily. And pen needles 2/day            Durable Medical Equipment  (From admission, onward)         Start     Ordered   06/30/19 0722  DME Walker rolling 5 wheels  Once    Comments: 5 wheel  Question:  Patient needs a walker to treat with the following condition  Answer:  Weakness   06/30/19 0721          Follow-up Information    [facility], [facility] Follow up.   Specialty: [place] Why: Home Health Contact information: [address] [place] [address] [phone]        [name], NP. Schedule an appointment as soon as possible for a visit in 1  week(s).   Specialty: Geriatric Medicine Contact information: [address]. [place] [address] [phone]        [name], MD. Schedule an appointment as soon as possible for a visit in 1 week(s).   Specialty: Cardiology Contact information: [address] [place] [address] [phone]        [name], [name], MD .   Specialty: Cardiology Contact information: [place] [address] [phone]        Kristeen Miss, MD Follow up in 2 week(s).   Specialty: Neurosurgery Why: back pain Contact information: [address] [place] [address] [place] [address] [phone]           Major procedures and Radiology Reports - PLEASE review detailed and final reports thoroughly  -       DG Abd 1 View  Result Date: 06/21/2019 CLINICAL DATA:  Abdominal pain. EXAM: ABDOMEN - 1 VIEW COMPARISON:  Scout image for CT scan of the abdomen dated 01/23/2019 FINDINGS: There is air scattered throughout the nondistended large and small bowel. Stomach is not distended. No abnormal abdominal calcifications. Bones are normal. IMPRESSION: Benign-appearing abdomen. Electronically Signed   By: Lorriane Shire M.D.   On: 06/21/2019 14:19   CT HEAD WO CONTRAST  Result Date: 06/17/2019 CLINICAL DATA:  Right-sided weakness, 2 days duration. Recent falling. EXAM: CT HEAD WITHOUT CONTRAST TECHNIQUE: Contiguous axial images were obtained from the base of the skull through the vertex without intravenous contrast. COMPARISON:  MRI 10/14/2017 FINDINGS: Brain: The brain shows a normal appearance without evidence of malformation, atrophy, old or acute small or large vessel infarction, mass lesion, hemorrhage, hydrocephalus or extra-axial collection. Vascular: There is atherosclerotic calcification of the major vessels at the base of the brain. Skull: Normal.  No traumatic finding.  No focal bone lesion. Sinuses/Orbits: Sinuses are  clear. Orbits appear normal. Mastoids are clear. Other: None significant IMPRESSION: Normal head CT except for atherosclerotic calcification of the major vessels at the base of the brain. Electronically Signed   By: Nelson Chimes M.D.   On: 06/17/2019 14:41   MR BRAIN W WO CONTRAST  Result Date: 06/17/2019 CLINICAL DATA:  Neuro deficit. Subacute right-sided weakness. EXAM: MRI HEAD WITHOUT AND WITH CONTRAST TECHNIQUE: Multiplanar, multiecho pulse sequences of the brain and surrounding structures were obtained without and with intravenous contrast. CONTRAST:  54m GADAVIST GADOBUTROL 1 MMOL/ML IV SOLN COMPARISON:  CT head 06/17/2019. MRI 10/14/2017 FINDINGS: Brain: No acute infarction, hemorrhage, hydrocephalus, extra-axial collection or mass lesion. No significant chronic ischemia Normal enhancement postcontrast administration Vascular: Normal arterial flow voids Skull and upper cervical spine: No focal skeletal lesion. Sinuses/Orbits: Mild mucosal edema paranasal sinuses. Normal orbit Other: None IMPRESSION: Negative MRI head with contrast. Electronically Signed   By: Franchot Gallo M.D.   On: 06/17/2019 19:01   MR THORACIC SPINE WO CONTRAST  Result Date: 06/20/2019 CLINICAL DATA:  Initial evaluation for mid back pain. EXAM: MRI THORACIC SPINE WITHOUT CONTRAST TECHNIQUE: Multiplanar, multisequence MR imaging of the thoracic spine was performed. No intravenous contrast was administered. COMPARISON:  None available. FINDINGS: Alignment: Vertebral bodies normally aligned with preservation of the normal thoracic kyphosis. No listhesis or subluxation. Vertebrae: Vertebral body height maintained without evidence for acute or chronic fracture. Bone marrow signal intensity within normal limits. Small benign hemangioma noted within the T9 vertebral body. No worrisome osseous lesions. No abnormal marrow edema. Cord: Signal intensity within the thoracic spinal cord is within normal limits. Paraspinal and other soft  tissues: Paraspinal soft tissues demonstrate no acute finding. Patchy multifocal opacities noted within the partially visualized lungs, likely related history of COVID infection. Small T2 hyperintense simple right renal cyst noted. Visualized visceral structures otherwise unremarkable. Disc levels: T1-2:  Unremarkable. T2-3: Unremarkable. T3-4: Tiny left paracentral disc protrusion minimally indents the left ventral thecal sac. No significant stenosis or cord deformity. T4-5:  Mild disc bulge. No significant canal or foraminal stenosis. T5-6: Small central disc protrusion minimally indents the ventral thecal sac. No significant stenosis or cord deformity. T6-7: Central/right paracentral disc protrusion indents the ventral thecal sac. Mild flattening of the ventral spinal cord without cord signal changes. No significant stenosis. T7-8: Central disc protrusion indents the ventral thecal sac, contacting and mildly flattening the ventral thoracic cord. Superimposed prominence of the dorsal epidural fat. No significant spinal stenosis. Foramina remain patent. T8-9: Small central disc protrusion indents the ventral thecal sac. Associated annular fissure. Minimal flattening of the ventral cord without cord signal changes. No significant stenosis. T9-10: Mild disc bulge with disc desiccation. Mild posterior element hypertrophy. No significant stenosis. T10-11:  Unremarkable. T11-12:  Unremarkable. T12-L1:  Unremarkable. IMPRESSION: 1. Multifocal disc protrusions involving the T3-4 through T9-10 interspaces as above, most prominent of which is positioned at the T7-8 level. No significant spinal stenosis. Findings could contribute to underlying back pain. 2. Patchy multifocal opacities within the partially visualized lungs, likely related to history of COVID infection. Electronically Signed   By: Jeannine Boga M.D.   On: 06/20/2019 00:04   MR LUMBAR SPINE WO CONTRAST  Result Date: 06/18/2019 CLINICAL DATA:   Initial evaluation for unilateral right lower extremity weakness, tingling in toes. EXAM: MRI LUMBAR SPINE WITHOUT CONTRAST TECHNIQUE: Multiplanar, multisequence MR imaging of the lumbar spine was performed. No intravenous contrast was administered. COMPARISON:  Prior radiograph from 01/18/2012. FINDINGS: Segmentation: Standard. Lowest well-formed disc space labeled the L5-S1 level. Alignment: Physiologic with preservation of the normal lumbar lordosis. No listhesis. Vertebrae: Vertebral body height maintained without evidence for acute or chronic fracture. Bone marrow signal intensity somewhat diffusely decreased on T1 weighted imaging, nonspecific, but most commonly related to anemia, smoking, or obesity. Subcentimeter benign hemangioma noted within the L4 vertebral body. No other discrete or worrisome osseous lesions. No abnormal marrow edema. Conus medullaris and cauda equina: Conus extends to the L1 level. Conus and cauda equina appear normal. Paraspinal and other soft tissues: Paraspinous soft tissues within normal limits.  Partially visualized urinary bladder moderately distended. Visualized visceral structures otherwise unremarkable. Disc levels: L1-2: Disc desiccation without significant disc bulge. No canal or neural foraminal stenosis. No impingement. L2-3: Mild disc desiccation without significant disc bulge. No canal or neural foraminal stenosis. L3-4: Disc desiccation with minimal annular disc bulge. No significant canal or neural foraminal stenosis. No impingement. L4-5: Disc desiccation with minimal annular disc bulge. Superimposed small left foraminal disc protrusion closely approximates the exiting left L4 nerve root without frank neural impingement (series 4, image 11). No significant canal or lateral recess stenosis. Mild left greater than right L4 foraminal stenosis. L5-S1: Negative interspace. Mild epidural lipomatosis. No canal or lateral recess stenosis. Foramina remain patent. No impingement.  IMPRESSION: 1. Small left foraminal disc protrusion at L4-5, closely approximating and potentially irritating the exiting left L4 nerve root. 2. Mild left greater than right L4 foraminal stenosis related to disc bulge and facet disease. 3. Additional minor for age degenerative disc disease elsewhere within the lumbar spine without significant stenosis or neural impingement. No other findings to explain patient's right lower extremity symptoms identified. 4. Diffusely decreased T1 marrow signal intensity, nonspecific, but most commonly related to anemia, smoking, or obesity. Correlation with history and laboratory values recommended. Electronically Signed   By: Jeannine Boga M.D.   On: 06/18/2019 21:06   DG Chest Port 1 View  Result Date: 06/25/2019 CLINICAL DATA:  Shortness of breath EXAM: PORTABLE CHEST 1 VIEW COMPARISON:  June 23, 2019 FINDINGS: There is increase in airspace opacity in the left base with small left pleural effusion. There is also airspace opacity in the lateral right base with small right pleural effusion. Heart is mildly enlarged with pulmonary vascularity normal. No adenopathy. There is aortic atherosclerosis. No bone lesions. IMPRESSION: Airspace opacity in both lower lung regions, more on the left on the right with small pleural effusions bilaterally. Heart mildly enlarged. Pulmonary vascularity normal. No adenopathy. Aortic Atherosclerosis (ICD10-I70.0). Electronically Signed   By: Lowella Grip III M.D.   On: 06/25/2019 08:12   DG CHEST PORT 1 VIEW  Result Date: 06/23/2019 CLINICAL DATA:  COVID-19 EXAM: PORTABLE CHEST 1 VIEW COMPARISON:  Two days ago FINDINGS: Similar extent of bilateral pulmonary infiltrate that is greater on the left. There may be mild progression at the right base. Normal heart size. No effusion or pneumothorax. IMPRESSION: Bilateral pneumonia which is stable or minimally progressed on the right. Electronically Signed   By: Monte Fantasia M.D.    On: 06/23/2019 08:59   DG CHEST PORT 1 VIEW  Result Date: 06/21/2019 CLINICAL DATA:  Hypoxemia and chest pain.  COVID 19 positive. EXAM: PORTABLE CHEST 1 VIEW COMPARISON:  06/19/2019 FINDINGS: Lungs are hypoinflated and demonstrate mild hazy opacification over the left base/retrocardiac region unchanged. No significant effusion. Cardiomediastinal silhouette and remainder of the exam is unchanged. IMPRESSION: Stable hazy opacification over the left base/retrocardiac region. Electronically Signed   By: Marin Olp M.D.   On: 06/21/2019 10:10   DG Chest Port 1 View  Result Date: 06/19/2019 CLINICAL DATA:  Cough. Right-sided weakness. EXAM: PORTABLE CHEST 1 VIEW COMPARISON:  06/17/2019 FINDINGS: There is a new focal area of atelectasis or infiltrate at the left lung base posterior medially. Minimal atelectasis at the right lung base. Lungs are otherwise clear. No effusions. Heart size and pulmonary vascularity are normal. No bone abnormality. IMPRESSION: 1. New focal area of atelectasis or infiltrate at the left lung base posterior medially. 2. Minimal new atelectasis at the right lung base. Electronically  Signed   By: Lorriane Shire M.D.   On: 06/19/2019 16:46   DG Chest Port 1 View  Result Date: 06/17/2019 CLINICAL DATA:  Chest pain EXAM: PORTABLE CHEST 1 VIEW COMPARISON:  June 05, 2019 FINDINGS: The heart size and mediastinal contours are within normal limits. Both lungs are clear. The visualized skeletal structures are unremarkable. IMPRESSION: No active disease. Electronically Signed   By: Prudencio Pair M.D.   On: 06/17/2019 12:12   DG Chest Portable 1 View  Result Date: 06/05/2019 CLINICAL DATA:  Chest pain and shortness of breath. EXAM: PORTABLE CHEST 1 VIEW COMPARISON:  01/23/2019 FINDINGS: The heart size and mediastinal contours are within normal limits. Both lungs are clear. The visualized skeletal structures are unremarkable. IMPRESSION: Normal exam. Electronically Signed   By: Lorriane Shire M.D.   On: 06/05/2019 18:08   DG HIP UNILAT WITH PELVIS 1V RIGHT  Result Date: 06/05/2019 CLINICAL DATA:  Right hip pain EXAM: DG HIP (WITH OR WITHOUT PELVIS) 1V RIGHT COMPARISON:  None. FINDINGS: There is no evidence of hip fracture or dislocation. There is no evidence of arthropathy or other focal bone abnormality. IMPRESSION: Negative. Electronically Signed   By: Rolm Baptise M.D.   On: 06/05/2019 20:36    Micro Results    No results found for this or any previous visit (from the past 240 hour(s)).  Today   Subjective    Calvin [name] today has no headache,no chest abdominal pain,no new weakness tingling or numbness, feels much better wants to go home today.     Objective   Blood pressure 128/77, pulse 91, temperature 97.9 F (36.6 C), temperature source Oral, resp. rate 20, height 5' 4" (1.626 m), weight 70.2 kg, SpO2 93 %.   Intake/Output Summary (Last 24 hours) at 06/30/2019 0908 Last data filed at 06/30/2019 0020 Gross per 24 hour  Intake 580 ml  Output 1900 ml  Net -1320 ml    Exam  Awake Alert, Oriented x 3, No new F.N deficits, Normal affect [place].AT,PERRAL Supple Neck,No JVD, No cervical lymphadenopathy appriciated.  Symmetrical Chest wall movement, Good air movement bilaterally, CTAB RRR,No Gallops,Rubs or new Murmurs, No Parasternal Heave +ve B.Sounds, Abd Soft, Non tender, No organomegaly appriciated, No rebound -guarding or rigidity. No Cyanosis, Clubbing or edema, No new Rash or bruise   Data Review   CBC w Diff:  Lab Results  Component Value Date   WBC 11.3 (H) 06/29/2019   HGB 15.5 06/29/2019   HGB 15.6 08/16/2017   HCT 44.8 06/29/2019   HCT 51.0 06/17/2019   PLT 417 (H) 06/29/2019   PLT 226 08/16/2017   LYMPHOPCT 21 06/29/2019   MONOPCT 5 06/29/2019   EOSPCT 1 06/29/2019   BASOPCT 0 06/29/2019    CMP:  Lab Results  Component Value Date   NA 134 (L) 06/29/2019   NA 138 05/07/2018   K 4.7 06/29/2019   CL 96 (L) 06/29/2019    CO2 25 06/29/2019   BUN 26 (H) 06/29/2019   BUN 13 05/07/2018   CREATININE 1.00 06/29/2019   CREATININE 1.17 05/02/2019   GLU 337 07/15/2016   PROT 5.6 (L) 06/29/2019   PROT 6.9 04/08/2019   ALBUMIN 2.6 (L) 06/29/2019   ALBUMIN 4.3 04/08/2019   BILITOT 0.4 06/29/2019   BILITOT 0.4 04/08/2019   ALKPHOS 54 06/29/2019   AST 23 06/29/2019   ALT 23 06/29/2019  .   Total Time in preparing paper work, data evaluation and todays exam - 35 minutes

## 2019-06-30 NOTE — Progress Notes (Signed)
Patient's family called with updates

## 2019-06-30 NOTE — Progress Notes (Signed)
Patient agreed tlo have his contact updated to call his God Daughter Calvin [name]. He has an apartment that she stays at with him and is agreeable to have him com back to the apartment after rehab. The patient will talk with his family to explant the changes to his contact list. This information was discussed with the charge RN.

## 2019-06-30 NOTE — TOC Transition Note (Signed)
Transition of Care [facility]) - CM/SW Discharge Note   Patient Details  Name: Akiva Brassfield MRN: [id number] Date of Birth: Jun 15, 1961  [facility] of [facility] CM/SW Contact:  Geralynn Ochs, LCSW Phone Number: 06/30/2019, 8:48 AM   Clinical Narrative:   CSW spoke with patient about discharge home. CSW discussed with patient about his family's concerns with requesting SNF placement, and patient indicated that he would do whatever was best, but if he was able to go home, then he wanted to go home. CSW asked patient about order for walker, and patient indicated that he already had one at home. Patient said he can reach out to his family for a ride home and someone can pick him up, will not need transportation. CSW alerted Alvis Lemmings that patient was discharging home, discussed the family concern for wanting SNF. [place] requested adding a Education officer, museum to home health orders, just in case. CSW sent message to MD to order social worker.     Final next level of care: [place] Barriers to Discharge: Barriers Resolved   Patient Goals and CMS Choice Patient states their goals for this hospitalization and ongoing recovery are:: Pt states he is ready to get back home and enjoy the holidays CMS Medicare.gov Compare Post Acute Care list provided to:: Patient Choice offered to / list presented to : Patient  Discharge Placement                Patient to be transferred to facility by: Family car Name of family member notified: Self Patient and family notified of of transfer: 06/30/19  Discharge Plan and Services                          HH Arranged: PT [facility] Agency: [place] Date [place]: 06/21/19 Time [place]: [phone] Representative spoke with at [place]: [place] ([place]) Interventions     Readmission Risk Interventions No flowsheet data found.

## 2019-07-01 ENCOUNTER — Ambulatory Visit: Payer: Self-pay | Admitting: Pharmacist

## 2019-07-01 LAB — GLUCOSE, CAPILLARY
Glucose-Capillary: 303 mg/dL — ABNORMAL HIGH (ref 70–99)
Glucose-Capillary: 279 mg/dL — ABNORMAL HIGH (ref 70–99)
Glucose-Capillary: 185 mg/dL — ABNORMAL HIGH (ref 70–99)
Glucose-Capillary: 215 mg/dL — ABNORMAL HIGH (ref 70–99)
Glucose-Capillary: 243 mg/dL — ABNORMAL HIGH (ref 70–99)

## 2019-07-01 NOTE — Discharge Instructions (Signed)
COVID-19 COVID-19 is a respiratory infection that is caused by a virus called severe acute respiratory syndrome coronavirus 2 (SARS-CoV-2). The disease is also known as coronavirus disease or novel coronavirus. In some people, the virus may not cause any symptoms. In others, it may cause a serious infection. The infection can get worse quickly and can lead to complications, such as:  Pneumonia, or infection of the lungs.  Acute respiratory distress syndrome or ARDS. This is fluid build-up in the lungs.  Acute respiratory failure. This is a condition in which there is not enough oxygen passing from the lungs to the body.  Sepsis or septic shock. This is a serious bodily reaction to an infection.  Blood clotting problems.  Secondary infections due to bacteria or fungus. The virus that causes COVID-19 is contagious. This means that it can spread from person to person through droplets from coughs and sneezes (respiratory secretions). What are the causes? This illness is caused by a virus. You may catch the virus by:  Breathing in droplets from an infected person's cough or sneeze.  Touching something, like a table or a doorknob, that was exposed to the virus (contaminated) and then touching your mouth, nose, or eyes. What increases the risk? Risk for infection You are more likely to be infected with this virus if you:  Live in or travel to an area with a COVID-19 outbreak.  Come in contact with a sick person who recently traveled to an area with a COVID-19 outbreak.  Provide care for or live with a person who is infected with COVID-19. Risk for serious illness You are more likely to become seriously ill from the virus if you:  Are 42 years of age or older.  Have a long-term disease that lowers your body's ability to fight infection (immunocompromised).  Live in a nursing home or long-term care facility.  Have a long-term (chronic) disease such as: ? Chronic lung disease,  including chronic obstructive pulmonary disease or asthma ? Heart disease. ? Diabetes. ? Chronic kidney disease. ? Liver disease.  Are obese. What are the signs or symptoms? Symptoms of this condition can range from mild to severe. Symptoms may appear any time from 2 to 14 days after being exposed to the virus. They include:  A fever.  A cough.  Difficulty breathing.  Chills.  Muscle pains.  A sore throat.  Loss of taste or smell. Some people may also have stomach problems, such as nausea, vomiting, or diarrhea. Other people may not have any symptoms of COVID-19. How is this diagnosed? This condition may be diagnosed based on:  Your signs and symptoms, especially if: ? You live in an area with a COVID-19 outbreak. ? You recently traveled to or from an area where the virus is common. ? You provide care for or live with a person who was diagnosed with COVID-19.  A physical exam.  Lab tests, which may include: ? A nasal swab to take a sample of fluid from your nose. ? A throat swab to take a sample of fluid from your throat. ? A sample of mucus from your lungs (sputum). ? Blood tests.  Imaging tests, which may include, X-rays, CT scan, or ultrasound. How is this treated? At present, there is no medicine to treat COVID-19. Medicines that treat other diseases are being used on a trial basis to see if they are effective against COVID-19. Your health care provider will talk with you about ways to treat your symptoms. For  most people, the infection is mild and can be managed at home with rest, fluids, and over-the-counter medicines. Treatment for a serious infection usually takes places in a hospital intensive care unit (ICU). It may include one or more of the following treatments. These treatments are given until your symptoms improve.  Receiving fluids and medicines through an IV.  Supplemental oxygen. Extra oxygen is given through a tube in the nose, a face mask, or a  hood.  Positioning you to lie on your stomach (prone position). This makes it easier for oxygen to get into the lungs.  Continuous positive airway pressure (CPAP) or bi-level positive airway pressure (BPAP) machine. This treatment uses mild air pressure to keep the airways open. A tube that is connected to a motor delivers oxygen to the body.  Ventilator. This treatment moves air into and out of the lungs by using a tube that is placed in your windpipe.  Tracheostomy. This is a procedure to create a hole in the neck so that a breathing tube can be inserted.  Extracorporeal membrane oxygenation (ECMO). This procedure gives the lungs a chance to recover by taking over the functions of the heart and lungs. It supplies oxygen to the body and removes carbon dioxide. Follow these instructions at home: Lifestyle  If you are sick, stay home except to get medical care. Your health care provider will tell you how long to stay home. Call your health care provider before you go for medical care.  Rest at home as told by your health care provider.  Do not use any products that contain nicotine or tobacco, such as cigarettes, e-cigarettes, and chewing tobacco. If you need help quitting, ask your health care provider.  Return to your normal activities as told by your health care provider. Ask your health care provider what activities are safe for you. General instructions  Take over-the-counter and prescription medicines only as told by your health care provider.  Drink enough fluid to keep your urine pale yellow.  Keep all follow-up visits as told by your health care provider. This is important. How is this prevented?  There is no vaccine to help prevent COVID-19 infection. However, there are steps you can take to protect yourself and others from this virus. To protect yourself:   Do not travel to areas where COVID-19 is a risk. The areas where COVID-19 is reported change often. To identify  high-risk areas and travel restrictions, check the CDC travel website: FatFares.com.br  If you live in, or must travel to, an area where COVID-19 is a risk, take precautions to avoid infection. ? Stay away from people who are sick. ? Wash your hands often with soap and water for 20 seconds. If soap and water are not available, use an alcohol-based hand sanitizer. ? Avoid touching your mouth, face, eyes, or nose. ? Avoid going out in public, follow guidance from your state and local health authorities. ? If you must go out in public, wear a cloth face covering or face mask. ? Disinfect objects and surfaces that are frequently touched every day. This may include:  Counters and tables.  Doorknobs and light switches.  Sinks and faucets.  Electronics, such as phones, remote controls, keyboards, computers, and tablets. To protect others: If you have symptoms of COVID-19, take steps to prevent the virus from spreading to others.  If you think you have a COVID-19 infection, contact your health care provider right away. Tell your health care team that you think you  may have a COVID-19 infection.  Stay home. Leave your house only to seek medical care. Do not use public transport.  Do not travel while you are sick.  Wash your hands often with soap and water for 20 seconds. If soap and water are not available, use alcohol-based hand sanitizer.  Stay away from other members of your household. Let healthy household members care for children and pets, if possible. If you have to care for children or pets, wash your hands often and wear a mask. If possible, stay in your own room, separate from others. Use a different bathroom.  Make sure that all people in your household wash their hands well and often.  Cough or sneeze into a tissue or your sleeve or elbow. Do not cough or sneeze into your hand or into the air.  Wear a cloth face covering or face mask. Where to find more  information  Centers for Disease Control and Prevention: PurpleGadgets.be  World Health Organization: https://www.castaneda.info/ Contact a health care provider if:  You live in or have traveled to an area where COVID-19 is a risk and you have symptoms of the infection.  You have had contact with someone who has COVID-19 and you have symptoms of the infection. Get help right away if:  You have trouble breathing.  You have pain or pressure in your chest.  You have confusion.  You have bluish lips and fingernails.  You have difficulty waking from sleep.  You have symptoms that get worse. These symptoms may represent a serious problem that is an emergency. Do not wait to see if the symptoms will go away. Get medical help right away. Call your local emergency services (911 in the [place] Do not drive yourself to the hospital. Let the emergency medical personnel know if you think you have COVID-19. Summary  COVID-19 is a respiratory infection that is caused by a virus. It is also known as coronavirus disease or novel coronavirus. It can cause serious infections, such as pneumonia, acute respiratory distress syndrome, acute respiratory failure, or sepsis.  The virus that causes COVID-19 is contagious. This means that it can spread from person to person through droplets from coughs and sneezes.  You are more likely to develop a serious illness if you are 12 years of age or older, have a weak immunity, live in a nursing home, or have chronic disease.  There is no medicine to treat COVID-19. Your health care provider will talk with you about ways to treat your symptoms.  Take steps to protect yourself and others from infection. Wash your hands often and disinfect objects and surfaces that are frequently touched every day. Stay away from people who are sick and wear a mask if you are sick. This information is not intended to replace advice given to you by  your health care provider. Make sure you discuss any questions you have with your health care provider. Document Released: 07/26/2018 Document Revised: 11/15/2018 Document Reviewed: 07/26/2018 Elsevier Patient Education  2020 [place]: How to Protect Yourself and Others Know how it spreads  There is currently no vaccine to prevent coronavirus disease 2019 (COVID-19).  The best way to prevent illness is to avoid being exposed to this virus.  The virus is thought to spread mainly from person-to-person. ? Between people who are in close contact with one another (within about 6 feet). ? Through respiratory droplets produced when an infected person coughs, sneezes or talks. ? These droplets can land  in the mouths or noses of people who are nearby or possibly be inhaled into the lungs. ? Some recent studies have suggested that COVID-19 may be spread by people who are not showing symptoms. Everyone should Clean your hands often  Wash your hands often with soap and water for at least 20 seconds especially after you have been in a public place, or after blowing your nose, coughing, or sneezing.  If soap and water are not readily available, use a hand sanitizer that contains at least 60% alcohol. Cover all surfaces of your hands and rub them together until they feel dry.  Avoid touching your eyes, nose, and mouth with unwashed hands. Avoid close contact  Stay home if you are sick.  Avoid close contact with people who are sick.  Put distance between yourself and other people. ? Remember that some people without symptoms may be able to spread virus. ? This is especially important for people who are at higher risk of getting very GainPain.com.cy Cover your mouth and nose with a cloth face cover when around others  You could spread COVID-19 to others even if you do not feel sick.  Everyone should wear a  cloth face cover when they have to go out in public, for example to the grocery store or to pick up other necessities. ? Cloth face coverings should not be placed on young children under age 94, anyone who has trouble breathing, or is unconscious, incapacitated or otherwise unable to remove the mask without assistance.  The cloth face cover is meant to protect other people in case you are infected.  Do NOT use a facemask meant for a Dietitian.  Continue to keep about 6 feet between yourself and others. The cloth face cover is not a substitute for social distancing. Cover coughs and sneezes  If you are in a private setting and do not have on your cloth face covering, remember to always cover your mouth and nose with a tissue when you cough or sneeze or use the inside of your elbow.  Throw used tissues in the trash.  Immediately wash your hands with soap and water for at least 20 seconds. If soap and water are not readily available, clean your hands with a hand sanitizer that contains at least 60% alcohol. Clean and disinfect  Clean AND disinfect frequently touched surfaces daily. This includes tables, doorknobs, light switches, countertops, handles, desks, phones, keyboards, toilets, faucets, and sinks. RackRewards.fr  If surfaces are dirty, clean them: Use detergent or soap and water prior to disinfection.  Then, use a household disinfectant. You can see a list of EPA-registered household disinfectants here. michellinders.com 11/06/2018 This information is not intended to replace advice given to you by your health care provider. Make sure you discuss any questions you have with your health care provider. Document Released: 10/16/2018 Document Revised: 11/14/2018 Document Reviewed: 10/16/2018 Elsevier Patient Education  2020 [employer].   COVID-19 Frequently Asked Questions COVID-19 (coronavirus disease) is  an infection that is caused by a large family of viruses. Some viruses cause illness in people and others cause illness in animals like camels, cats, and bats. In some cases, the viruses that cause illness in animals can spread to humans. Where did the coronavirus come from? In December 2019, [place] told the [facility]) of several cases of lung disease (human respiratory illness). These cases were linked to an open seafood and livestock market in the city of [place]. The link to the seafood  and livestock market suggests that the virus may have spread from animals to humans. However, since that first outbreak in December, the virus has also been shown to spread from person to person. What is the name of the disease and the virus? Disease name Early on, this disease was called novel coronavirus. This is because scientists determined that the disease was caused by a new (novel) respiratory virus. The World Health Organization [facility]) has now named the disease COVID-19, or coronavirus disease. Virus name The virus that causes the disease is called severe acute respiratory syndrome coronavirus 2 (SARS-CoV-2). More information on disease and virus naming World Health Organization [facility]): www.who.int/emergencies/diseases/novel-coronavirus-2019/technical-guidance/naming-the-coronavirus-disease-(covid-2019)-and-the-virus-that-causes-it Who is at risk for complications from coronavirus disease? Some people may be at higher risk for complications from coronavirus disease. This includes older adults and people who have chronic diseases, such as heart disease, diabetes, and lung disease. If you are at higher risk for complications, take these extra precautions:  Avoid close contact with people who are sick or have a fever or cough. Stay at least 3-6 ft (1-2 m) away from them, if possible.  Wash your hands often with soap and water for at least 20 seconds.  Avoid touching your face, mouth, nose, or  eyes.  Keep supplies on hand at home, such as food, medicine, and cleaning supplies.  Stay home as much as possible.  Avoid social gatherings and travel. How does coronavirus disease spread? The virus that causes coronavirus disease spreads easily from person to person (is contagious). There are also cases of community-spread disease. This means the disease has spread to:  People who have no known contact with other infected people.  People who have not traveled to areas where there are known cases. It appears to spread from one person to another through droplets from coughing or sneezing. Can I get the virus from touching surfaces or objects? There is still a lot that we do not know about the virus that causes coronavirus disease. Scientists are basing a lot of information on what they know about similar viruses, such as:  Viruses cannot generally survive on surfaces for long. They need a human body (host) to survive.  It is more likely that the virus is spread by close contact with people who are sick (direct contact), such as through: ? Shaking hands or hugging. ? Breathing in respiratory droplets that travel through the air. This can happen when an infected person coughs or sneezes on or near other people.  It is less likely that the virus is spread when a person touches a surface or object that has the virus on it (indirect contact). The virus may be able to enter the body if the person touches a surface or object and then touches his or her face, eyes, nose, or mouth. Can a person spread the virus without having symptoms of the disease? It may be possible for the virus to spread before a person has symptoms of the disease, but this is most likely not the main way the virus is spreading. It is more likely for the virus to spread by being in close contact with people who are sick and breathing in the respiratory droplets of a sick person's cough or sneeze. What are the symptoms of  coronavirus disease? Symptoms vary from person to person and can range from mild to severe. Symptoms may include:  Fever.  Cough.  Tiredness, weakness, or fatigue.  Fast breathing or feeling short of breath. These symptoms can appear anywhere  from 2 to 14 days after you have been exposed to the virus. If you develop symptoms, call your health care provider. People with severe symptoms may need hospital care. If I am exposed to the virus, how long does it take before symptoms start? Symptoms of coronavirus disease may appear anywhere from 2 to 14 days after a person has been exposed to the virus. If you develop symptoms, call your health care provider. Should I be tested for this virus? Your health care provider will decide whether to test you based on your symptoms, history of exposure, and your risk factors. How does a health care provider test for this virus? Health care providers will collect samples to send for testing. Samples may include:  Taking a swab of fluid from the nose.  Taking fluid from the lungs by having you cough up mucus (sputum) into a sterile cup.  Taking a blood sample.  Taking a stool or urine sample. Is there a treatment or vaccine for this virus? Currently, there is no vaccine to prevent coronavirus disease. Also, there are no medicines like antibiotics or antivirals to treat the virus. A person who becomes sick is given supportive care, which means rest and fluids. A person may also relieve his or her symptoms by using over-the-counter medicines that treat sneezing, coughing, and runny nose. These are the same medicines that a person takes for the common cold. If you develop symptoms, call your health care provider. People with severe symptoms may need hospital care. What can I do to protect myself and my family from this virus?     You can protect yourself and your family by taking the same actions that you would take to prevent the spread of other viruses.  Take the following actions:  Wash your hands often with soap and water for at least 20 seconds. If soap and water are not available, use alcohol-based hand sanitizer.  Avoid touching your face, mouth, nose, or eyes.  Cough or sneeze into a tissue, sleeve, or elbow. Do not cough or sneeze into your hand or the air. ? If you cough or sneeze into a tissue, throw it away immediately and wash your hands.  Disinfect objects and surfaces that you frequently touch every day.  Avoid close contact with people who are sick or have a fever or cough. Stay at least 3-6 ft (1-2 m) away from them, if possible.  Stay home if you are sick, except to get medical care. Call your health care provider before you get medical care.  Make sure your vaccines are up to date. Ask your health care provider what vaccines you need. What should I do if I need to travel? Follow travel recommendations from your local health authority, the CDC, and WHO. Travel information and advice  Centers for Disease Control and Prevention (CDC): BodyEditor.hu  World Health Organization [facility]): ThirdIncome.ca Know the risks and take action to protect your health  You are at higher risk of getting coronavirus disease if you are traveling to areas with an outbreak or if you are exposed to travelers from areas with an outbreak.  Wash your hands often and practice good hygiene to lower the risk of catching or spreading the virus. What should I do if I am sick? General instructions to stop the spread of infection  Wash your hands often with soap and water for at least 20 seconds. If soap and water are not available, use alcohol-based hand sanitizer.  Cough or sneeze into  a tissue, sleeve, or elbow. Do not cough or sneeze into your hand or the air.  If you cough or sneeze into a tissue, throw it away immediately and wash your hands.  Stay  home unless you must get medical care. Call your health care provider or local health authority before you get medical care.  Avoid public areas. Do not take public transportation, if possible.  If you can, wear a mask if you must go out of the house or if you are in close contact with someone who is not sick. Keep your home clean  Disinfect objects and surfaces that are frequently touched every day. This may include: ? Counters and tables. ? Doorknobs and light switches. ? Sinks and faucets. ? Electronics such as phones, remote controls, keyboards, computers, and tablets.  Wash dishes in hot, soapy water or use a dishwasher. Air-dry your dishes.  Wash laundry in hot water. Prevent infecting other household members  Let healthy household members care for children and pets, if possible. If you have to care for children or pets, wash your hands often and wear a mask.  Sleep in a different bedroom or bed, if possible.  Do not share personal items, such as razors, toothbrushes, deodorant, combs, brushes, towels, and washcloths. Where to find more information Centers for Disease Control and Prevention (CDC)  Information and news updates: https://www.butler-gonzalez.com/ World Health Organization [facility])  Information and news updates: MissExecutive.com.ee  Coronavirus health topic: https://www.castaneda.info/  Questions and answers on COVID-19: OpportunityDebt.at  Global tracker: who.sprinklr.com American Academy of Pediatrics (AAP)  Information for families: www.healthychildren.org/English/health-issues/conditions/chest-lungs/Pages/2019-Novel-Coronavirus.aspx The coronavirus situation is changing rapidly. Check your local health authority website or the CDC and [facility] websites for updates and news. When should I contact a health care provider?  Contact your health care provider if you have symptoms of an  infection, such as fever or cough, and you: ? Have been near anyone who is known to have coronavirus disease. ? Have come into contact with a person who is suspected to have coronavirus disease. ? Have traveled outside of the country. When should I get emergency medical care?  Get help right away by calling your local emergency services (911 in the [place] if you have: ? Trouble breathing. ? Pain or pressure in your chest. ? Confusion. ? Blue-tinged lips and fingernails. ? Difficulty waking from sleep. ? Symptoms that get worse. Let the emergency medical personnel know if you think you have coronavirus disease. Summary  A new respiratory virus is spreading from person to person and causing COVID-19 (coronavirus disease).  The virus that causes COVID-19 appears to spread easily. It spreads from one person to another through droplets from coughing or sneezing.  Older adults and those with chronic diseases are at higher risk of disease. If you are at higher risk for complications, take extra precautions.  There is currently no vaccine to prevent coronavirus disease. There are no medicines, such as antibiotics or antivirals, to treat the virus.  You can protect yourself and your family by washing your hands often, avoiding touching your face, and covering your coughs and sneezes. This information is not intended to replace advice given to you by your health care provider. Make sure you discuss any questions you have with your health care provider. Document Released: 10/16/2018 Document Revised: 10/16/2018 Document Reviewed: 10/16/2018 Elsevier Patient Education  [place] with Primary MD [name], NP in 7 days   Get CBC, CMP, 2 view Chest X ray -  checked next visit within 1 week by Primary MD or SNF MD ( we routinely change or add medications that can affect your baseline labs and fluid status, therefore we recommend that you get the mentioned basic workup next  visit with your PCP, your PCP may decide not to get them or add new tests based on their clinical decision)  Activity: As tolerated with Full fall precautions use walker/cane & assistance as needed  Disposition Home    Diet: Heart Healthy Low Carb, 1.5 lit/day fluid restriction  Accuchecks 4 times/day, Once in AM empty stomach and then before each meal. Log in all results and show them to your Prim.MD in 3 days. If any glucose reading is under 80 or above 300 call your Prim MD immidiately. Follow Low glucose instructions for glucose under 80 as instructed.    Special Instructions: If you have smoked or chewed Tobacco  in the last 2 yrs please stop smoking, stop any regular Alcohol  and or any Recreational drug use.  On your next visit with your primary care physician please Get Medicines reviewed and adjusted.  Please request your Prim.MD to go over all Hospital Tests and Procedure/Radiological results at the follow up, please get all Hospital records sent to your Prim MD by signing hospital release before you go home.  If you experience worsening of your admission symptoms, develop shortness of breath, life threatening emergency, suicidal or homicidal thoughts you must seek medical attention immediately by calling 911 or calling your MD immediately  if symptoms less severe.  You Must read complete instructions/literature along with all the possible adverse reactions/side effects for all the Medicines you take and that have been prescribed to you. Take any new Medicines after you have completely understood and accpet all the possible adverse reactions/side effects.       Person Under Monitoring Name: Calvin [name]  Location: [place] [address]   Infection Prevention Recommendations for Individuals Confirmed to have, or Being Evaluated for, 2019 Novel Coronavirus (COVID-19) Infection Who Receive Care at Home  Individuals who are confirmed to have, or  are being evaluated for, COVID-19 should follow the prevention steps below until a healthcare provider or local or state health department says they can return to normal activities.  Stay home except to get medical care You should restrict activities outside your home, except for getting medical care. Do not go to work, school, or public areas, and do not use public transportation or taxis.  Call ahead before visiting your doctor Before your medical appointment, call the healthcare provider and tell them that you have, or are being evaluated for, COVID-19 infection. This will help the healthcare provider's office take steps to keep other people from getting infected. Ask your healthcare provider to call the local or state health department.  Monitor your symptoms Seek prompt medical attention if your illness is worsening (e.g., difficulty breathing). Before going to your medical appointment, call the healthcare provider and tell them that you have, or are being evaluated for, COVID-19 infection. Ask your healthcare provider to call the local or state health department.  Wear a facemask You should wear a facemask that covers your nose and mouth when you are in the same room with other people and when you visit a healthcare provider. People who live with or visit you should also wear a facemask while they are in the same room with you.  Separate yourself from other people in your home As much  as possible, you should stay in a different room from other people in your home. Also, you should use a separate bathroom, if available.  Avoid sharing household items You should not share dishes, drinking glasses, cups, eating utensils, towels, bedding, or other items with other people in your home. After using these items, you should wash them thoroughly with soap and water.  Cover your coughs and sneezes Cover your mouth and nose with a tissue when you cough or sneeze, or you can cough or sneeze  into your sleeve. Throw used tissues in a lined trash can, and immediately wash your hands with soap and water for at least 20 seconds or use an alcohol-based hand rub.  Wash your [facility] your hands often and thoroughly with soap and water for at least 20 seconds. You can use an alcohol-based hand sanitizer if soap and water are not available and if your hands are not visibly dirty. Avoid touching your eyes, nose, and mouth with unwashed hands.   Prevention Steps for Caregivers and Household Members of Individuals Confirmed to have, or Being Evaluated for, COVID-19 Infection Being Cared for in the Home  If you live with, or provide care at home for, a person confirmed to have, or being evaluated for, COVID-19 infection please follow these guidelines to prevent infection:  Follow healthcare provider's instructions Make sure that you understand and can help the patient follow any healthcare provider instructions for all care.  Provide for the patient's basic needs You should help the patient with basic needs in the home and provide support for getting groceries, prescriptions, and other personal needs.  Monitor the patient's symptoms If they are getting sicker, call his or her medical provider and tell them that the patient has, or is being evaluated for, COVID-19 infection. This will help the healthcare provider's office take steps to keep other people from getting infected. Ask the healthcare provider to call the local or state health department.  Limit the number of people who have contact with the patient  If possible, have only one caregiver for the patient.  Other household members should stay in another home or place of residence. If this is not possible, they should stay  in another room, or be separated from the patient as much as possible. Use a separate bathroom, if available.  Restrict visitors who do not have an essential need to be in the home.  Keep older adults,  very young children, and other sick people away from the patient Keep older adults, very young children, and those who have compromised immune systems or chronic health conditions away from the patient. This includes people with chronic heart, lung, or kidney conditions, diabetes, and cancer.  Ensure good ventilation Make sure that shared spaces in the home have good air flow, such as from an air conditioner or an opened window, weather permitting.  Wash your hands often  Wash your hands often and thoroughly with soap and water for at least 20 seconds. You can use an alcohol based hand sanitizer if soap and water are not available and if your hands are not visibly dirty.  Avoid touching your eyes, nose, and mouth with unwashed hands.  Use disposable paper towels to dry your hands. If not available, use dedicated cloth towels and replace them when they become wet.  Wear a facemask and gloves  Wear a disposable facemask at all times in the room and gloves when you touch or have contact with the patient's blood, body  fluids, and/or secretions or excretions, such as sweat, saliva, sputum, nasal mucus, vomit, urine, or feces.  Ensure the mask fits over your nose and mouth tightly, and do not touch it during use.  Throw out disposable facemasks and gloves after using them. Do not reuse.  Wash your hands immediately after removing your facemask and gloves.  If your personal clothing becomes contaminated, carefully remove clothing and launder. Wash your hands after handling contaminated clothing.  Place all used disposable facemasks, gloves, and other waste in a lined container before disposing them with other household waste.  Remove gloves and wash your hands immediately after handling these items.  Do not share dishes, glasses, or other household items with the patient  Avoid sharing household items. You should not share dishes, drinking glasses, cups, eating utensils, towels, bedding, or  other items with a patient who is confirmed to have, or being evaluated for, COVID-19 infection.  After the person uses these items, you should wash them thoroughly with soap and water.  Wash laundry thoroughly  Immediately remove and wash clothes or bedding that have blood, body fluids, and/or secretions or excretions, such as sweat, saliva, sputum, nasal mucus, vomit, urine, or feces, on them.  Wear gloves when handling laundry from the patient.  Read and follow directions on labels of laundry or clothing items and detergent. In general, wash and dry with the warmest temperatures recommended on the label.  Clean all areas the individual has used often  Clean all touchable surfaces, such as counters, tabletops, doorknobs, bathroom fixtures, toilets, phones, keyboards, tablets, and bedside tables, every day. Also, clean any surfaces that may have blood, body fluids, and/or secretions or excretions on them.  Wear gloves when cleaning surfaces the patient has come in contact with.  Use a diluted bleach solution (e.g., dilute bleach with 1 part bleach and 10 parts water) or a household disinfectant with a label that says EPA-registered for coronaviruses. To make a bleach solution at home, add 1 tablespoon of bleach to 1 quart (4 cups) of water. For a larger supply, add  cup of bleach to 1 gallon (16 cups) of water.  Read labels of cleaning products and follow recommendations provided on product labels. Labels contain instructions for safe and effective use of the cleaning product including precautions you should take when applying the product, such as wearing gloves or eye protection and making sure you have good ventilation during use of the product.  Remove gloves and wash hands immediately after cleaning.  Monitor yourself for signs and symptoms of illness Caregivers and household members are considered close contacts, should monitor their health, and will be asked to limit movement  outside of the home to the extent possible. Follow the monitoring steps for close contacts listed on the symptom monitoring form.   ? If you have additional questions, contact your local health department or call the epidemiologist on call at [phone] (available 24/7). ? This guidance is subject to change. For the most up-to-date guidance from [facility], please refer to their website: [email or web address]

## 2019-07-01 NOTE — TOC Progression Note (Signed)
Transition of Care [facility]) - Progression Note    Patient Details  Name: Calvin [name] MRN: [id number] Date of Birth: 05-06-1961  Transition of Care [facility]) CM/SW Contact  Calvin Courts, RN Phone Number: 07/01/2019, 12:54 PM  Clinical Narrative:    CM spoke with patient regarding discharge planning. Patient states to CM that he does not want to go to a SNF he wants to return home to his apartment where he has been living with his god daughter for the last 3 years.  Cm discussed [facility] services and need for 4 wheel rolling walker, patient was agreeable to services.  CM contacted [employer] and arranged for 4 wheel walker to be delivered to apartment this afternoon.  Calvin [name] to provide home health services.  CM informed patient that his sisters have been calling to discuss his discharge plans and obtained permission from patient to speak with sisters.   CM called sisters numerous times and was able to get in touch with Calvin [name], who had her other sisters on a multiway call.  The sister's informed CM that patient was not able to return to the apartment because he is not on the lease and the person on the lease is getting evicted.  Sisters state they felt the situation was unsafe because they state multiple people were living in the apartment who had gotten sick with Covid and there was no one to provide care for their brother.  Sister's state they have called the apartment complex corporate office and shared their concerns and that the corporate office stated they would issue an eviction.  Sister's state the patient's god daughter is not who he lived with and that she was not taking care of the patient. CM discussed the possible discharge options open to patient. Patient has been d/c from the hospital and the sister's wanted him to go to a SNF.  CM explained that short term SNF stay would require recommendations from PT for SNF as well as a bed offer and authorization from his insurance.  At this time patient  has been re-evaluated by PT and the recommendations are for home health services.  Given that the sister's state the patient is now homeless, CM asked if any of his family members were able to give the patient a place to stay/take him in.  All sister's declined to assist, stating they have pre-existing condition's and have had other family members who have battled Covid and cannot take him in. Two of his sister's are in [place].  CM explained that the next option would be to reach out to one of the agencies working with the homeless and find placement in a homeless shelter.  The sister's became very upset by this, going over the various medical conditions the patient has and asking how he woul dgive himself medication.  CM once again attempted to explain the limited options available to patient given a lack of  family support.  Sister's asked if CM can call the insurance company and explain the patient's medical condition. CM explained the process for obtaining auth from insurance and explained the requirement for a short term rehab stay versus custodial placement.  Sisters state they want a short term stay.  CM additionally, explained that the patient has expressed that he does not want to go to a rehab facility and is alert and oriented and capable of making his own medical decisions. CM explained that she will discuss the new developments with the patient in regard to potential homelessness. CM  spoke with patient over the telephone and explained the nature of the conversation held with his sisters.  Patient states that he has told his sisters that he does not have the money to pay for SNF and wants to go home.  CM asked him about the possible eviction and patient stated that he doesn't understand how he could be evicted as he wasn't notified of this and has been paying his rent.  Patient further stated that he has spoken with his god-daughter who is on her way to pick up the patient and take him home to the  apartment and that the god daughter has not gotten any notice of eviction.  CM asked for permission from patient to speak with god daughter which the patient granted and gave CM phone number to contact Calvin [name].   CM spoke with Calvin [name] who was very upset about the situation and the interference from the patient's sisters.  She stated that she was on her way to pick up the patient and take him home, that he lives in an apartment that is in her cousin's name and they have attempted to call the main office of the complex to find out about eviction and that they have no idea what the sister's are talking about. Per Calvin [name], no one has been evicted and patient has a safe place to go to.  Calvin [name] further states that patient has had home health nurse services in the past and she was hopeful that those will continue.   Cm spoke with patient who reiterates that he wishes to return home. Patient will discharge to home with [facility] services from [place] and a 4-wheel rolling walker to be delivered by [place].        Expected Discharge Plan: [place] Barriers to Discharge: No Barriers Identified  Expected Discharge Plan and Services Expected Discharge Plan: [place] arrangements for the past 2 months: Single Family Home Expected Discharge Date: 07/01/19                         HH Arranged: PT HH Agency: [place] Date [place]: 06/21/19 Time [place]: [phone] Representative spoke with at [place]: [place] ([place]) Interventions    Readmission Risk Interventions No flowsheet data found.

## 2019-07-01 NOTE — Progress Notes (Signed)
Calvin [name] MGN:[id number] DOB: [birth year] DOA: 06/17/2019  PCP: [name], NP  Admit date: 06/17/2019  Discharge date: 07/01/2019  Admitted From: Home  Disposition:  Home   Recommendations for Outpatient Follow-up:   Follow up with PCP in 1-2 weeks  PCP Please obtain BMP/CBC, 2 view CXR in 1week,  (see Discharge instructions)   PCP Please follow up on the following pending results: Needs outpatient neurosurgery follow-up for back pain and chronic spine disc disease.   Home Health: PR, RN, social work Equipment/Devices: Conservation officer, nature Consultations: None  Discharge Condition: Stable    CODE STATUS: Full    Diet Recommendation: Heart Healthy, low carbohydrate.  1.5 L/day total fluid restriction.   Chief Complaint  Patient presents with  . 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 He was found to have SIRS coronavirus 2 infection, he developed some hypoxic respiratory failure was treated with steroids, remdesivir, convalescent plasma and Actemra at [facility].  He was also evaluated by neurosurgery.  Subsequently sent to [facility] on 06/25/2019.                                                                  Hospital Course   1. Acute Hypoxic Resp. Failure due to Acute Covid 19 Viral Pneumonitis during the ongoing 2020 Covid 19 Pandemic - he had moderate disease he has received appropriate treatment with steroids, remdesivir, convalescent plasma and  Actemra at [facility].  He is considerably improved and now symptom-free on room air currently, eager to go home, will be discharged home with albuterol as needed rescue inhaler and outpatient PCP follow-up, home PT, RN also ordered upon discharge.  Note patient was discharged on 06/30/2019, sister insisted that he go to SNF, he is completely symptom-free, ambulated in the hallway with PT without any distress.  He is competent to make decisions and states that he refuses to be placed anywhere else and wants to go home, he will be discharged home.  Remains symptom-free.   Recent Labs  Lab 06/25/19 0500 06/25/19 0750 06/26/19 0200 06/27/19 0205 06/28/19 1100 06/28/19 1240 06/29/19 0250  CRP 11.3* 9.4* 6.1* 2.5* 0.8  --  1.0*  DDIMER  --   --  1.46* 2.75*  --  2.38* 1.93*  BNP  --  59.8 49.5 38.8  --  103.5* 44.1  PROCALCITON 0.11 <0.10  --   --   --   --   --     Hepatic  Function Latest Ref Rng & Units 06/29/2019 06/27/2019 06/26/2019  Total Protein 6.5 - 8.1 g/dL 5.6(L) 6.3(L) 6.7  Albumin 3.5 - 5.0 g/dL 2.6(L) 2.7(L) 2.8(L)  AST 15 - 41 U/L 23 29 32  ALT 0 - 44 U/L _0 Alk Phosphatase 38 - 126 U/L 54 63 66  Total Bilirubin 0.3 - 1.2 mg/dL 0.4 0.5 0.5  Bilirubin, Direct 0.00 - 0.40 mg/dL - - -    2.  Acute on chronic right-sided lower back pain with radiation to right lower extremity and some right lower extremity weakness acute on chronic- previous MD had discussed the case and his MRI findings with neurosurgery, no further input, supportive care, weakness and pain improved.  Received PT OT which she will get at home and will follow with neurosurgery outpatient.  PCP to assist in referral.  3.  Combined acute on chronic systolic and diastolic heart failure.  EF around 40% on echocardiogram done in 01/2019.  Continue Coreg, ACE inhibitor and home regimen unchanged.  4.  Dyslipidemia.  Continue Zetia.  5.  Essential hypertension.  On Lasix and Coreg, ACE inhibitor on hold  as he had developed mild AKI.   6. Constipation - B regimen started.  7. Mild AKI on CKD 3.  Baseline creatinine anywhere between 1.15-1.3. Improving with diuresis.  8.  Paroxysmal A. fib.  Mali vas 2 score of at least 3.  Continue beta-blocker and Eliquis as before.    9. DM2 -continue home regimen and follow with PCP for glycemic control, he received insulin and diabetic education here by diabetic coordinator,  DM and insulin education prior to discharge as he had poor outpatient glycemic control due to hyperglycemia.  Lab Results  Component Value Date   HGBA1C 11.9 (H) 06/17/2019     Discharge diagnosis     Principal Problem:   Right sided weakness Active Problems:   DM (diabetes mellitus) ([place])   HLD (hyperlipidemia)   Chronic systolic heart failure (HCC)   Essential hypertension   OSA on CPAP   Onychomycosis of multiple toenails with type 2 diabetes mellitus and peripheral neuropathy (HCC)   MCI (mild cognitive impairment)   Diabetic neuropathy (HCC)   AF (paroxysmal atrial fibrillation) ([place])   AKI (acute kidney injury) ([place])   COVID-19 virus detected    Discharge instructions    Discharge Instructions    MyChart COVID-19 home monitoring program   Complete by: Jun 30, 2019    Is the patient willing to use the [place] for home monitoring?: Yes   Temperature monitoring   Complete by: Jun 30, 2019    After how many days would you like to receive a notification of this patient's flowsheet entries?: 1   Discharge instructions   Complete by: As directed    Follow with Primary MD [name], NP in 7 days   Get CBC, CMP, 2 view Chest X ray -  checked next visit within 1 week by Primary MD or SNF MD ( we routinely change or add medications that can affect your baseline labs and fluid status, therefore we recommend that you get the mentioned basic workup next visit with your PCP, your PCP may decide not to get them or add new tests based on their  clinical decision)  Activity: As tolerated with Full fall precautions use walker/cane & assistance as needed  Disposition Home   Diet: Heart Healthy Low Carb, 1.5lit/day fluid restriction  Accuchecks 4 times/day, Once in AM  empty stomach and then before each meal. Log in all results and show them to your Prim.MD in 3 days. If any glucose reading is under 80 or above 300 call your Prim MD immidiately. Follow Low glucose instructions for glucose under 80 as instructed.    Special Instructions: If you have smoked or chewed Tobacco  in the last 2 yrs please stop smoking, stop any regular Alcohol  and or any Recreational drug use.  On your next visit with your primary care physician please Get Medicines reviewed and adjusted.  Please request your Prim.MD to go over all Hospital Tests and Procedure/Radiological results at the follow up, please get all Hospital records sent to your Prim MD by signing hospital release before you go home.  If you experience worsening of your admission symptoms, develop shortness of breath, life threatening emergency, suicidal or homicidal thoughts you must seek medical attention immediately by calling 911 or calling your MD immediately  if symptoms less severe.  You Must read complete instructions/literature along with all the possible adverse reactions/side effects for all the Medicines you take and that have been prescribed to you. Take any new Medicines after you have completely understood and accpet all the possible adverse reactions/side effects.   Increase activity slowly   Complete by: As directed       Discharge Medications   Allergies as of 07/01/2019   No Known Allergies     Medication List    TAKE these medications   albuterol 108 (90 Base) MCG/ACT inhaler Commonly known as: VENTOLIN HFA Inhale 2 puffs into the lungs every 6 (six) hours as needed for wheezing or shortness of breath.   amitriptyline 10 MG tablet Commonly known as:  ELAVIL Take 1 tablet at bed time for one week and then increase 2 tablets at bed time What changed:   how much to take  how to take this  when to take this  additional instructions   apixaban 5 MG Tabs tablet Commonly known as: ELIQUIS Take 5 mg by mouth 2 (two) times daily.   carvedilol 6.25 MG tablet Commonly known as: COREG Take 1 tablet (6.25 mg total) by mouth 2 (two) times daily with a meal.   Contour Test test strip Generic drug: glucose blood 1 each by Other route 2 (two) times a day.   donepezil 10 MG tablet Commonly known as: ARICEPT Take 1 tablet (10 mg total) by mouth at bedtime.   ezetimibe 10 MG tablet Commonly known as: ZETIA Take 1 tablet (10 mg total) by mouth daily.   INSULIN SYRINGE .3CC/29GX1/2" 29G X 1/2" 0.3 ML Misc Inject 1 Syringe 3 (three) times daily as directed. Check blood sugar three times daily. Dx: E11.9   lisinopril 10 MG tablet Commonly known as: ZESTRIL Take 1 tablet (10 mg total) by mouth at bedtime.   nitroGLYCERIN 0.4 MG SL tablet Commonly known as: NITROSTAT Place 1 tablet (0.4 mg total) under the tongue every 5 (five) minutes as needed for chest pain.   polyethylene glycol 17 g packet Commonly known as: MIRALAX / GLYCOLAX Take 17 g by mouth 2 (two) times daily.   Repatha 140 MG/ML Sosy Generic drug: Evolocumab Inject 140 mg into the skin every 14 (fourteen) days.   senna 8.6 MG Tabs tablet Commonly known as: SENOKOT Take 1 tablet (8.6 mg total) by mouth daily as needed for mild constipation.   [name] FlexTouch 200 UNIT/ML Sopn Generic drug: Insulin Degludec Inject 220 Units into the skin daily. And  pen needles 2/day            Durable Medical Equipment  (From admission, onward)         Start     Ordered   06/30/19 0722  DME Walker rolling 5 wheels  Once    Comments: 5 wheel  Question:  Patient needs a walker to treat with the following condition  Answer:  Weakness   06/30/19 0721          Follow-up  Information    Care, [facility] Follow up.   Specialty: [place] Why: Home Health Contact information: [address] [place] [address] [phone]        [name], NP. Schedule an appointment as soon as possible for a visit in 1 week(s).   Specialty: Geriatric Medicine Why: Follow up with PCP in 1-2 weeks  PCP Please obtain BMP/CBC, 2 view CXR in 1week Contact information: [address] [place] [address] [phone]        [name], MD. Schedule an appointment as soon as possible for a visit in 1 week(s).   Specialty: Cardiology Contact information: [address] [place] [address] [phone]        [name], [name], MD .   Specialty: Cardiology Contact information: [place] [address] [phone]        Kristeen Miss, MD Follow up in 2 week(s).   Specialty: Neurosurgery Why: back pain Contact information: [address] [place] [address] [phone]           Major procedures and Radiology Reports - PLEASE review detailed and final reports thoroughly  -       DG Abd 1 View  Result Date: 06/21/2019 CLINICAL DATA:  Abdominal pain. EXAM: ABDOMEN - 1 VIEW COMPARISON:  Scout image for CT scan of the abdomen dated 01/23/2019 FINDINGS: There is air scattered throughout the nondistended large and small bowel. Stomach is not distended. No abnormal abdominal calcifications. Bones are normal. IMPRESSION: Benign-appearing abdomen. Electronically Signed   By: Lorriane Shire M.D.   On: 06/21/2019 14:19   CT HEAD WO CONTRAST  Result Date: 06/17/2019 CLINICAL DATA:  Right-sided weakness, 2 days duration. Recent falling. EXAM: CT HEAD WITHOUT CONTRAST TECHNIQUE: Contiguous axial images were obtained from the base of the skull through the vertex without intravenous contrast. COMPARISON:  MRI 10/14/2017 FINDINGS: Brain:  The brain shows a normal appearance without evidence of malformation, atrophy, old or acute small or large vessel infarction, mass lesion, hemorrhage, hydrocephalus or extra-axial collection. Vascular: There is atherosclerotic calcification of the major vessels at the base of the brain. Skull: Normal.  No traumatic finding.  No focal bone lesion. Sinuses/Orbits: Sinuses are clear. Orbits appear normal. Mastoids are clear. Other: None significant IMPRESSION: Normal head CT except for atherosclerotic calcification of the major vessels at the base of the brain. Electronically Signed   By: Nelson Chimes M.D.   On: 06/17/2019 14:41   MR BRAIN W WO CONTRAST  Result Date: 06/17/2019 CLINICAL DATA:  Neuro deficit. Subacute right-sided weakness. EXAM: MRI HEAD WITHOUT AND WITH CONTRAST TECHNIQUE: Multiplanar, multiecho pulse sequences of the brain and surrounding structures were obtained without and with intravenous contrast. CONTRAST:  38m GADAVIST GADOBUTROL 1 MMOL/ML IV SOLN COMPARISON:  CT head 06/17/2019. MRI 10/14/2017 FINDINGS: Brain: No acute infarction, hemorrhage, hydrocephalus, extra-axial collection or mass lesion. No significant chronic ischemia Normal enhancement  postcontrast administration Vascular: Normal arterial flow voids Skull and upper cervical spine: No focal skeletal lesion. Sinuses/Orbits: Mild mucosal edema paranasal sinuses. Normal orbit Other: None IMPRESSION: Negative MRI head with contrast. Electronically Signed   By: Franchot Gallo M.D.   On: 06/17/2019 19:01   MR THORACIC SPINE WO CONTRAST  Result Date: 06/20/2019 CLINICAL DATA:  Initial evaluation for mid back pain. EXAM: MRI THORACIC SPINE WITHOUT CONTRAST TECHNIQUE: Multiplanar, multisequence MR imaging of the thoracic spine was performed. No intravenous contrast was administered. COMPARISON:  None available. FINDINGS: Alignment: Vertebral bodies normally aligned with preservation of the normal thoracic kyphosis. No listhesis or  subluxation. Vertebrae: Vertebral body height maintained without evidence for acute or chronic fracture. Bone marrow signal intensity within normal limits. Small benign hemangioma noted within the T9 vertebral body. No worrisome osseous lesions. No abnormal marrow edema. Cord: Signal intensity within the thoracic spinal cord is within normal limits. Paraspinal and other soft tissues: Paraspinal soft tissues demonstrate no acute finding. Patchy multifocal opacities noted within the partially visualized lungs, likely related history of COVID infection. Small T2 hyperintense simple right renal cyst noted. Visualized visceral structures otherwise unremarkable. Disc levels: T1-2:  Unremarkable. T2-3: Unremarkable. T3-4: Tiny left paracentral disc protrusion minimally indents the left ventral thecal sac. No significant stenosis or cord deformity. T4-5:  Mild disc bulge. No significant canal or foraminal stenosis. T5-6: Small central disc protrusion minimally indents the ventral thecal sac. No significant stenosis or cord deformity. T6-7: Central/right paracentral disc protrusion indents the ventral thecal sac. Mild flattening of the ventral spinal cord without cord signal changes. No significant stenosis. T7-8: Central disc protrusion indents the ventral thecal sac, contacting and mildly flattening the ventral thoracic cord. Superimposed prominence of the dorsal epidural fat. No significant spinal stenosis. Foramina remain patent. T8-9: Small central disc protrusion indents the ventral thecal sac. Associated annular fissure. Minimal flattening of the ventral cord without cord signal changes. No significant stenosis. T9-10: Mild disc bulge with disc desiccation. Mild posterior element hypertrophy. No significant stenosis. T10-11:  Unremarkable. T11-12:  Unremarkable. T12-L1:  Unremarkable. IMPRESSION: 1. Multifocal disc protrusions involving the T3-4 through T9-10 interspaces as above, most prominent of which is positioned  at the T7-8 level. No significant spinal stenosis. Findings could contribute to underlying back pain. 2. Patchy multifocal opacities within the partially visualized lungs, likely related to history of COVID infection. Electronically Signed   By: Jeannine Boga M.D.   On: 06/20/2019 00:04   MR LUMBAR SPINE WO CONTRAST  Result Date: 06/18/2019 CLINICAL DATA:  Initial evaluation for unilateral right lower extremity weakness, tingling in toes. EXAM: MRI LUMBAR SPINE WITHOUT CONTRAST TECHNIQUE: Multiplanar, multisequence MR imaging of the lumbar spine was performed. No intravenous contrast was administered. COMPARISON:  Prior radiograph from 01/18/2012. FINDINGS: Segmentation: Standard. Lowest well-formed disc space labeled the L5-S1 level. Alignment: Physiologic with preservation of the normal lumbar lordosis. No listhesis. Vertebrae: Vertebral body height maintained without evidence for acute or chronic fracture. Bone marrow signal intensity somewhat diffusely decreased on T1 weighted imaging, nonspecific, but most commonly related to anemia, smoking, or obesity. Subcentimeter benign hemangioma noted within the L4 vertebral body. No other discrete or worrisome osseous lesions. No abnormal marrow edema. Conus medullaris and cauda equina: Conus extends to the L1 level. Conus and cauda equina appear normal. Paraspinal and other soft tissues: Paraspinous soft tissues within normal limits. Partially visualized urinary bladder moderately distended. Visualized visceral structures otherwise unremarkable. Disc levels: L1-2: Disc desiccation without significant disc bulge. No canal or  neural foraminal stenosis. No impingement. L2-3: Mild disc desiccation without significant disc bulge. No canal or neural foraminal stenosis. L3-4: Disc desiccation with minimal annular disc bulge. No significant canal or neural foraminal stenosis. No impingement. L4-5: Disc desiccation with minimal annular disc bulge. Superimposed small  left foraminal disc protrusion closely approximates the exiting left L4 nerve root without frank neural impingement (series 4, image 11). No significant canal or lateral recess stenosis. Mild left greater than right L4 foraminal stenosis. L5-S1: Negative interspace. Mild epidural lipomatosis. No canal or lateral recess stenosis. Foramina remain patent. No impingement. IMPRESSION: 1. Small left foraminal disc protrusion at L4-5, closely approximating and potentially irritating the exiting left L4 nerve root. 2. Mild left greater than right L4 foraminal stenosis related to disc bulge and facet disease. 3. Additional minor for age degenerative disc disease elsewhere within the lumbar spine without significant stenosis or neural impingement. No other findings to explain patient's right lower extremity symptoms identified. 4. Diffusely decreased T1 marrow signal intensity, nonspecific, but most commonly related to anemia, smoking, or obesity. Correlation with history and laboratory values recommended. Electronically Signed   By: Jeannine Boga M.D.   On: 06/18/2019 21:06   DG Chest Port 1 View  Result Date: 06/25/2019 CLINICAL DATA:  Shortness of breath EXAM: PORTABLE CHEST 1 VIEW COMPARISON:  June 23, 2019 FINDINGS: There is increase in airspace opacity in the left base with small left pleural effusion. There is also airspace opacity in the lateral right base with small right pleural effusion. Heart is mildly enlarged with pulmonary vascularity normal. No adenopathy. There is aortic atherosclerosis. No bone lesions. IMPRESSION: Airspace opacity in both lower lung regions, more on the left on the right with small pleural effusions bilaterally. Heart mildly enlarged. Pulmonary vascularity normal. No adenopathy. Aortic Atherosclerosis (ICD10-I70.0). Electronically Signed   By: Lowella Grip III M.D.   On: 06/25/2019 08:12   DG CHEST PORT 1 VIEW  Result Date: 06/23/2019 CLINICAL DATA:  COVID-19 EXAM:  PORTABLE CHEST 1 VIEW COMPARISON:  Two days ago FINDINGS: Similar extent of bilateral pulmonary infiltrate that is greater on the left. There may be mild progression at the right base. Normal heart size. No effusion or pneumothorax. IMPRESSION: Bilateral pneumonia which is stable or minimally progressed on the right. Electronically Signed   By: Monte Fantasia M.D.   On: 06/23/2019 08:59   DG CHEST PORT 1 VIEW  Result Date: 06/21/2019 CLINICAL DATA:  Hypoxemia and chest pain.  COVID 19 positive. EXAM: PORTABLE CHEST 1 VIEW COMPARISON:  06/19/2019 FINDINGS: Lungs are hypoinflated and demonstrate mild hazy opacification over the left base/retrocardiac region unchanged. No significant effusion. Cardiomediastinal silhouette and remainder of the exam is unchanged. IMPRESSION: Stable hazy opacification over the left base/retrocardiac region. Electronically Signed   By: Marin Olp M.D.   On: 06/21/2019 10:10   DG Chest Port 1 View  Result Date: 06/19/2019 CLINICAL DATA:  Cough. Right-sided weakness. EXAM: PORTABLE CHEST 1 VIEW COMPARISON:  06/17/2019 FINDINGS: There is a new focal area of atelectasis or infiltrate at the left lung base posterior medially. Minimal atelectasis at the right lung base. Lungs are otherwise clear. No effusions. Heart size and pulmonary vascularity are normal. No bone abnormality. IMPRESSION: 1. New focal area of atelectasis or infiltrate at the left lung base posterior medially. 2. Minimal new atelectasis at the right lung base. Electronically Signed   By: Lorriane Shire M.D.   On: 06/19/2019 16:46   DG Chest Port 1 View  Result Date:  06/17/2019 CLINICAL DATA:  Chest pain EXAM: PORTABLE CHEST 1 VIEW COMPARISON:  June 05, 2019 FINDINGS: The heart size and mediastinal contours are within normal limits. Both lungs are clear. The visualized skeletal structures are unremarkable. IMPRESSION: No active disease. Electronically Signed   By: Prudencio Pair M.D.   On: 06/17/2019 12:12    DG Chest Portable 1 View  Result Date: 06/05/2019 CLINICAL DATA:  Chest pain and shortness of breath. EXAM: PORTABLE CHEST 1 VIEW COMPARISON:  01/23/2019 FINDINGS: The heart size and mediastinal contours are within normal limits. Both lungs are clear. The visualized skeletal structures are unremarkable. IMPRESSION: Normal exam. Electronically Signed   By: Lorriane Shire M.D.   On: 06/05/2019 18:08   DG HIP UNILAT WITH PELVIS 1V RIGHT  Result Date: 06/05/2019 CLINICAL DATA:  Right hip pain EXAM: DG HIP (WITH OR WITHOUT PELVIS) 1V RIGHT COMPARISON:  None. FINDINGS: There is no evidence of hip fracture or dislocation. There is no evidence of arthropathy or other focal bone abnormality. IMPRESSION: Negative. Electronically Signed   By: Rolm Baptise M.D.   On: 06/05/2019 20:36    Micro Results    No results found for this or any previous visit (from the past 240 hour(s)).  Today   Subjective    Sarath Privott today has no headache,no chest abdominal pain,no new weakness tingling or numbness, feels much better wants to go home today.     Objective   Blood pressure 111/65, pulse 94, temperature 97.7 F (36.5 C), temperature source Oral, resp. rate 17, height _0  (1.626 m), weight 70.2 kg, SpO2 96 %.   Intake/Output Summary (Last 24 hours) at 07/01/2019 0914 Last data filed at 06/30/2019 2100 Gross per 24 hour  Intake 140 ml  Output --  Net 140 ml    Exam  Awake Alert,   No new F.N deficits, Normal affect [place].AT,PERRAL Supple Neck,No JVD, No cervical lymphadenopathy appriciated.  Symmetrical Chest wall movement, Good air movement bilaterally, CTAB RRR,No Gallops, Rubs or new Murmurs, No Parasternal Heave +ve B.Sounds, Abd Soft, No tenderness, No organomegaly appriciated, No rebound - guarding or rigidity. No Cyanosis, Clubbing or edema, No new Rash or bruise    Data Review   CBC w Diff:  Lab Results  Component Value Date   WBC 11.3 (H) 06/29/2019   HGB 15.5  06/29/2019   HGB 15.6 08/16/2017   HCT 44.8 06/29/2019   HCT 51.0 06/17/2019   PLT 417 (H) 06/29/2019   PLT 226 08/16/2017   LYMPHOPCT 21 06/29/2019   MONOPCT 5 06/29/2019   EOSPCT 1 06/29/2019   BASOPCT 0 06/29/2019    CMP:  Lab Results  Component Value Date   NA 134 (L) 06/29/2019   NA 138 05/07/2018   K 4.7 06/29/2019   CL 96 (L) 06/29/2019   CO2 25 06/29/2019   BUN 26 (H) 06/29/2019   BUN 13 05/07/2018   CREATININE 1.00 06/29/2019   CREATININE 1.17 05/02/2019   GLU 337 07/15/2016   PROT 5.6 (L) 06/29/2019   PROT 6.9 04/08/2019   ALBUMIN 2.6 (L) 06/29/2019   ALBUMIN 4.3 04/08/2019   BILITOT 0.4 06/29/2019   BILITOT 0.4 04/08/2019   ALKPHOS 54 06/29/2019   AST 23 06/29/2019   ALT 23 06/29/2019  .   Total Time in preparing paper work, data evaluation and todays exam - 84 minutes  Lala Lund M.D on 07/01/2019 at 9:14 AM  [facility]   Office  [phone]

## 2019-07-01 NOTE — Plan of Care (Signed)
Plan of Care reviewed. 

## 2019-07-01 NOTE — Progress Notes (Signed)
Physical Therapy Treatment Patient Details Name: Calvin [name] MRN: [id number] DOB: 01-Jul-1961 Today's Date: 07/01/2019    History of Present Illness 58 y.o. 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 fib on Eliquis, obstructive sleep apnea, HLD came to the hospital with complaints of right-sided weakness of upper and lower extremity. CT negative.   Positive for COVID,Acute Hypoxic Resp. Failure due to Acute Covid 19 Viral Pneumonitis    PT Comments    The patient  Ambulated in room, 20' x 2  on RA with SPO2 on ear probe 96-100%, on finger  Reading 86%. Patient was SOB after ambulated to BR. Then ambulated x 50' with RW and was not as SOB. Encouraged IS and Flutter.  Discussed energy conservation, has second level B/B, he plans to stay upstairs, states his family will assist. MD  Now states family request SNF. Patient will benefit from HHPT and a 4 wheeled RW  For home to allow rest breaks. Patient's EF 35%.   patient reports mild dizziness when ambulating but not worsening when up.   Follow Up Recommendations  Home health PT;Supervision for mobility/OOB      Equipment Recommendations  (4 wheeled RW.)    Recommendations for Other Services       Precautions / Restrictions Precautions Precaution Comments: watch vitals,    Mobility  Bed Mobility Overal bed mobility: Independent                Transfers Overall transfer level: Needs assistance Equipment used: None;Rolling walker (2 wheeled) Transfers: Sit to/from Stand Sit to Stand: Supervision         General transfer comment: no assistance  Ambulation/Gait Ambulation/Gait assistance: Min guard;Supervision Gait Distance (Feet): 20 Feet(x2) Assistive device: None;Rolling walker (2 wheeled) Gait Pattern/deviations: Step-through pattern;Staggering left;Staggering right Gait velocity: decr   General Gait Details:  patient amblated to BR without RW, tends to cruise on wall. Ambulated x 50' with RW, slight LOB x 1. Ambulated on RA. Spo2 83% on finger on ear 96%. Initially 3/4 dyspnea to Br, not after ambulated in room w/ RW.   Stairs             Wheelchair Mobility    Modified Rankin (Stroke Patients Only)       Balance Overall balance assessment: Mild deficits observed, not formally tested   Sitting balance-Leahy Scale: Good     Standing balance support: During functional activity;Single extremity supported Standing balance-Leahy Scale: Poor Standing balance comment: mild deficits with dynamic without UE support,                            Cognition Arousal/Alertness: Awake/alert Behavior During Therapy: WFL for tasks assessed/performed Overall Cognitive Status: Within Functional Limits for tasks assessed                                 General Comments: patient is calm and much better.      Exercises Other Exercises Other Exercises: Flutter valve x 10 with mod cues on technique.  Other Exercises: Orange TB for UE. Other Exercises: HEP  stand  for legs    General Comments        Pertinent Vitals/Pain Pain Assessment: No/denies pain    Home Living  Prior Function            PT Goals (current goals can now be found in the care plan section) Progress towards PT goals: Progressing toward goals    Frequency    Min 3X/week      PT Plan Current plan remains appropriate    Co-evaluation              AM-PAC PT "6 Clicks" Mobility   Outcome Measure  Help needed turning from your back to your side while in a flat bed without using bedrails?: None Help needed moving from lying on your back to sitting on the side of a flat bed without using bedrails?: None Help needed moving to and from a bed to a chair (including a wheelchair)?: None Help needed standing up from a chair using your arms (e.g., wheelchair  or bedside chair)?: None Help needed to walk in hospital room?: A Little Help needed climbing 3-5 steps with a railing? : A Lot 6 Click Score: 21    End of Session   Activity Tolerance: Patient tolerated treatment well Patient left: in chair;with call bell/phone within reach Nurse Communication: Mobility status       Time: 0254-2706 PT Time Calculation (min) (ACUTE ONLY): 47 min  Charges:  $Gait Training: 23-37 mins $Self Care/Home Management: [place] Pager [phone] Office [phone]    Claretha Cooper 07/01/2019, 8:48 AM

## 2019-07-01 NOTE — Plan of Care (Signed)
Discharge

## 2019-07-02 ENCOUNTER — Telehealth: Payer: Self-pay | Admitting: *Deleted

## 2019-07-02 ENCOUNTER — Other Ambulatory Visit: Payer: Self-pay

## 2019-07-02 NOTE — Telephone Encounter (Signed)
I have made the 1st attempt to contact the patient or family member in charge, in order to follow up from recently being discharged from the hospital. I left a message on voicemail but I will make another attempt at a different time.  
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
## 2019-07-03 ENCOUNTER — Other Ambulatory Visit: Payer: Self-pay | Admitting: Pharmacist

## 2019-07-03 ENCOUNTER — Other Ambulatory Visit: Payer: Self-pay

## 2019-07-03 ENCOUNTER — Ambulatory Visit: Payer: Self-pay | Admitting: Pharmacist

## 2019-07-03 DIAGNOSIS — J9601 Acute respiratory failure with hypoxia: Secondary | ICD-10-CM | POA: Diagnosis not present

## 2019-07-03 DIAGNOSIS — J1289 Other viral pneumonia: Secondary | ICD-10-CM | POA: Diagnosis not present

## 2019-07-03 DIAGNOSIS — I130 Hypertensive heart and chronic kidney disease with heart failure and stage 1 through stage 4 chronic kidney disease, or unspecified chronic kidney disease: Secondary | ICD-10-CM | POA: Diagnosis not present

## 2019-07-03 DIAGNOSIS — U071 COVID-19: Secondary | ICD-10-CM | POA: Diagnosis not present

## 2019-07-03 DIAGNOSIS — I5043 Acute on chronic combined systolic (congestive) and diastolic (congestive) heart failure: Secondary | ICD-10-CM

## 2019-07-03 DIAGNOSIS — I480 Paroxysmal atrial fibrillation: Secondary | ICD-10-CM | POA: Diagnosis not present

## 2019-07-03 DIAGNOSIS — E1142 Type 2 diabetes mellitus with diabetic polyneuropathy: Secondary | ICD-10-CM

## 2019-07-03 DIAGNOSIS — N183 Chronic kidney disease, stage 3 unspecified: Secondary | ICD-10-CM

## 2019-07-03 DIAGNOSIS — E1122 Type 2 diabetes mellitus with diabetic chronic kidney disease: Secondary | ICD-10-CM

## 2019-07-03 NOTE — Telephone Encounter (Signed)
I have made the 2nd attempt to contact the patient or family member in charge, in order to follow up from recently being discharged from the hospital. I left a message on voicemail but I will make another attempt at a different time.  
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
## 2019-07-04 DIAGNOSIS — U071 COVID-19: Secondary | ICD-10-CM | POA: Diagnosis not present

## 2019-07-04 DIAGNOSIS — N183 Chronic kidney disease, stage 3 unspecified: Secondary | ICD-10-CM | POA: Diagnosis not present

## 2019-07-04 DIAGNOSIS — E1122 Type 2 diabetes mellitus with diabetic chronic kidney disease: Secondary | ICD-10-CM | POA: Diagnosis not present

## 2019-07-04 DIAGNOSIS — I130 Hypertensive heart and chronic kidney disease with heart failure and stage 1 through stage 4 chronic kidney disease, or unspecified chronic kidney disease: Secondary | ICD-10-CM | POA: Diagnosis not present

## 2019-07-04 DIAGNOSIS — I480 Paroxysmal atrial fibrillation: Secondary | ICD-10-CM | POA: Diagnosis not present

## 2019-07-04 DIAGNOSIS — J1289 Other viral pneumonia: Secondary | ICD-10-CM | POA: Diagnosis not present

## 2019-07-04 DIAGNOSIS — E1142 Type 2 diabetes mellitus with diabetic polyneuropathy: Secondary | ICD-10-CM | POA: Diagnosis not present

## 2019-07-04 DIAGNOSIS — I5043 Acute on chronic combined systolic (congestive) and diastolic (congestive) heart failure: Secondary | ICD-10-CM | POA: Diagnosis not present

## 2019-07-04 DIAGNOSIS — J9601 Acute respiratory failure with hypoxia: Secondary | ICD-10-CM | POA: Diagnosis not present

## 2019-07-08 ENCOUNTER — Ambulatory Visit: Payer: Medicare HMO

## 2019-07-08 ENCOUNTER — Other Ambulatory Visit: Payer: Self-pay
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
## 2019-07-09 ENCOUNTER — Telehealth (HOSPITAL_COMMUNITY): Payer: Self-pay

## 2019-07-09 ENCOUNTER — Ambulatory Visit: Payer: Self-pay

## 2019-07-09 ENCOUNTER — Other Ambulatory Visit: Payer: Self-pay | Admitting: Pharmacy Technician

## 2019-07-09 ENCOUNTER — Other Ambulatory Visit: Payer: Self-pay | Admitting: Pharmacist

## 2019-07-09 ENCOUNTER — Ambulatory Visit: Payer: Self-pay | Admitting: Pharmacist

## 2019-07-09 NOTE — Patient Outreach (Signed)
[facility]) Care Management  07/09/2019  Calvin [name] Nov 08, 1960 [id number]                                       Medication Assistance Referral  Referral From: [place]  Medication/Company: [name] / Novo Nordisk Patient application portion:  Mailed Provider application portion: Faxed  to [name] Provider address/fax verified via: Office website   Follow up:  Will follow up with patient in 10-14 business days to confirm application(s) have been received.  [name] [name] [name] Certified Pharmacy Technician [place] Management Direct Dial:[phone]

## 2019-07-09 NOTE — Patient Outreach (Addendum)
[facility]) Care Management  [place] 07/09/2019  Calvin [name] [id number]  Reason for call: medication mgmt  Communication received from [facility] representative from 2020 insurance plan stating issue with co-pays was due to claims in process at both [facility] and mail order pharmacy which were then reversed.    Successful call with patient who states he is doing well and has all medications.  He reports he thinks he has ~5 days left of Toujeo insulin.  He reports he has his new [facility] Medicare card for 2021 insurance and can provide updated information to pharmacy to call in refill. He reports EMT Joellen Jersey is planning on stopping by today to help fill pillbox for him.    3-way call placed to [place].  Unfortunately, [name] does not have the correct card.  Pharmacist was able to find his [employer].   Unfortunately, due to high insulin requirements, 1x fill will put patient into the coverage gap and patient unable to afford co-pay.  Also appears he has $95 deductible with his new plan.   Patient may be eligible for [employer] patient assistance program if he only has PARTIAL Extra Help rather than Full Extra Help.  Reviewed program with patient who is agreeable to apply.  He is staying with goddaughter and requests application be mailed here.  He states he will go to bank to get copies of statements to provide for proof of income.    Patient reports he has not contacted endocrinologist regarding need for glucometer.  UHC preferred Accu-check Aviva, One Touch Ultra MIni and One Touch Ultra 2 in 2020.   Plan: I will route patient assistance letter to [place] technician who will coordinate patient assistance program application process for medications listed above.  [facility] pharmacy technician will assist with obtaining all required documents from both patient and provider(s) and submit application(s) once  completed.    Will update endocrinologist as patient will likely run out of insulin later this week and next appt is not until 07/30/2019. Office may have samples of insulin for patient.  Will also requet provider send in new RXs for glucometer and testing supplies to [facility])  Ralene Bathe, PharmD, [place] [phone]

## 2019-07-10 ENCOUNTER — Ambulatory Visit: Payer: Medicare HMO

## 2019-07-10 ENCOUNTER — Telehealth: Payer: Self-pay

## 2019-07-10 ENCOUNTER — Other Ambulatory Visit: Payer: Self-pay
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
## 2019-07-10 NOTE — Telephone Encounter (Signed)
Called out to pt about home visit this week. No answer, I LVM for him to return my call.   Marylouise Stacks, EMT-Paramedic  07/10/19

## 2019-07-10 NOTE — Telephone Encounter (Signed)
Company: [facility]  Document: NovoNordisk PAP Other records requested: None requested  All above requested information has been faxed successfully to [employer] listed above. Documents and fax confirmation have been placed in the faxed file for future reference.

## 2019-07-11 ENCOUNTER — Other Ambulatory Visit: Payer: Self-pay

## 2019-07-11 NOTE — Patient Outreach (Signed)
[place] [facility]) Care Management  07/11/2019  Calvin [name] [birth year] [id number]   Follow up call to patient regarding housing situation.  See note from 07/10/19.  Informed patient that Regional Freight forwarder with [employer], [name], is unable to speak with me about apartment situation until she has verbal consent from him.  Patient was with [facility] RN at time of call so he asked me to provide contact information for [name] to his goddaughter.  Requested a call back once he has spoken with [name]; will call her at that time to discuss situation   [name], [place] Worker [phone]

## 2019-07-13 ENCOUNTER — Encounter (HOSPITAL_COMMUNITY): Payer: Self-pay | Admitting: Emergency Medicine

## 2019-07-13 ENCOUNTER — Emergency Department (HOSPITAL_COMMUNITY): Payer: Medicare Other

## 2019-07-13 ENCOUNTER — Other Ambulatory Visit: Payer: Self-pay

## 2019-07-13 ENCOUNTER — Emergency Department (HOSPITAL_COMMUNITY)
Admission: EM | Admit: 2019-07-13 | Discharge: 2019-07-14 | Disposition: A | Discharge status: Home / Self Care | Payer: Medicare Other | Attending: Emergency Medicine | Admitting: Emergency Medicine

## 2019-07-13 DIAGNOSIS — I2510 Atherosclerotic heart disease of native coronary artery without angina pectoris: Secondary | ICD-10-CM | POA: Diagnosis not present

## 2019-07-13 DIAGNOSIS — Z7901 Long term (current) use of anticoagulants: Secondary | ICD-10-CM | POA: Insufficient documentation

## 2019-07-13 DIAGNOSIS — R1032 Left lower quadrant pain: Secondary | ICD-10-CM | POA: Diagnosis not present

## 2019-07-13 DIAGNOSIS — I10 Essential (primary) hypertension: Secondary | ICD-10-CM | POA: Insufficient documentation

## 2019-07-13 DIAGNOSIS — K5641 Fecal impaction: Secondary | ICD-10-CM | POA: Diagnosis not present

## 2019-07-13 DIAGNOSIS — M549 Dorsalgia, unspecified: Secondary | ICD-10-CM

## 2019-07-13 DIAGNOSIS — I4891 Unspecified atrial fibrillation: Secondary | ICD-10-CM | POA: Diagnosis not present

## 2019-07-13 DIAGNOSIS — Z794 Long term (current) use of insulin: Secondary | ICD-10-CM | POA: Insufficient documentation

## 2019-07-13 DIAGNOSIS — E119 Type 2 diabetes mellitus without complications: Secondary | ICD-10-CM | POA: Insufficient documentation

## 2019-07-13 DIAGNOSIS — Z79899 Other long term (current) drug therapy: Secondary | ICD-10-CM | POA: Insufficient documentation

## 2019-07-13 LAB — CBC WITH DIFFERENTIAL/PLATELET
Abs Immature Granulocytes: 0.01 10*3/uL (ref 0.00–0.07)
Basophils Absolute: 0 10*3/uL (ref 0.0–0.1)
Basophils Relative: 1 %
Eosinophils Absolute: 0.2 10*3/uL (ref 0.0–0.5)
Eosinophils Relative: 3 %
HCT: 42.9 % (ref 39.0–52.0)
Hemoglobin: 14.5 g/dL (ref 13.0–17.0)
Immature Granulocytes: 0 %
Lymphocytes Relative: 34 %
Lymphs Abs: 2.1 10*3/uL (ref 0.7–4.0)
MCH: 30.4 pg (ref 26.0–34.0)
MCHC: 33.8 g/dL (ref 30.0–36.0)
MCV: 89.9 fL (ref 80.0–100.0)
Monocytes Absolute: 0.5 10*3/uL (ref 0.1–1.0)
Monocytes Relative: 8 %
Neutro Abs: 3.3 10*3/uL (ref 1.7–7.7)
Neutrophils Relative %: 54 %
Platelets: 162 10*3/uL (ref 150–400)
RBC: 4.77 MIL/uL (ref 4.22–5.81)
RDW: 13.1 % (ref 11.5–15.5)
WBC: 6.1 10*3/uL (ref 4.0–10.5)
nRBC: 0 % (ref 0.0–0.2)

## 2019-07-13 LAB — BASIC METABOLIC PANEL
Anion gap: 9 (ref 5–15)
BUN: 10 mg/dL (ref 6–20)
CO2: 23 mmol/L (ref 22–32)
Calcium: 9.8 mg/dL (ref 8.9–10.3)
Chloride: 109 mmol/L (ref 98–111)
Creatinine, Ser: 1.17 mg/dL (ref 0.61–1.24)
GFR calc Af Amer: >60 mL/min (ref >60)
GFR calc non Af Amer: >60 mL/min (ref >60)
Glucose, Bld: 217 mg/dL — ABNORMAL HIGH (ref 70–99)
Potassium: 4.1 mmol/L (ref 3.5–5.1)
Sodium: 141 mmol/L (ref 135–145)

## 2019-07-13 MED ORDER — MORPHINE SULFATE (PF) 4 MG/ML IV SOLN
4.0000 mg | Freq: Once | INTRAVENOUS | Status: AC
  Administered 2019-07-13: 4 mg via INTRAVENOUS
  Filled 2019-07-13: qty 1

## 2019-07-13 MED ORDER — MINERAL OIL RE ENEM
1.0000 | ENEMA | Freq: Once | RECTAL | Status: AC
  Administered 2019-07-13: 1 via RECTAL
  Filled 2019-07-13: qty 1

## 2019-07-13 MED ORDER — MINERAL OIL RE ENEM
1.0000 | ENEMA | Freq: Once | RECTAL | Status: DC
  Filled 2019-07-13: qty 1

## 2019-07-13 MED ORDER — IOHEXOL 300 MG/ML  SOLN
100.0000 mL | Freq: Once | INTRAMUSCULAR | Status: AC | PRN
  Administered 2019-07-13: 100 mL via INTRAVENOUS

## 2019-07-13 MED ORDER — SODIUM CHLORIDE 0.9 % IV BOLUS
1000.0000 mL | Freq: Once | INTRAVENOUS | Status: AC
  Administered 2019-07-13: 1000 mL via INTRAVENOUS

## 2019-07-13 NOTE — Discharge Instructions (Addendum)
Mix 3-4 17 g doses of Miralax (capful) in 8 oz liquid of your choice. Repeat every 12 hours until stool.  May use over the counter fleet enema daily over next 2 days until stool.  Make sure to drink plenty of liquids at home to stay hydrated.

## 2019-07-13 NOTE — ED Provider Notes (Signed)
[facility] EMERGENCY DEPARTMENT Provider Note   CSN: [id number] Arrival date & time: 07/13/19  2015    History Chief Complaint  Patient presents with   Back Pain   Deion Swift is a 59 y.o. male with past medical history significant for diabetes, GERD, hypertension, hyperlipidemia, cardiomyopathy, A. fib who presents for evaluation of abdominal pain and back pain.  Patient states for the last 2 days he has had left lower quadrant abdominal pain that radiates into his back.  Patient states he has been able to have a bowel movement.  He has tried MiraLAX and Ex-Lax without resolve of symptoms.  Patient states he is passing flatulence.  He denies fever, chills, nausea, vomiting, chest pain, shortness of breath, recent falls, injuries, IV drug use, bowel or bladder incontinence, saddle paresthesia, melena or bright red blood per rectum.  Denies additional aggravating or alleviating factors.  Rates his current pain a 10/10.  States he has also had some generalized left lower back pain which is worse with movement.  No trauma. No prior hx of AAA, dissection.  History obtained from patient and past medical records.  No interpreter is used.  HPI     Past Medical History:  Diagnosis Date   Adenoidal hypertrophy    Adenomatous colon polyps 2012   Arthritis    Diabetes mellitus    GERD (gastroesophageal reflux disease)    Heart attack ([place])    [place]   Hx of adenomatous colonic polyps 08/18/2017   Hyperlipidemia    Hypertension    Mild CAD    a. cath in 08/2017 showing mild nonobstructive CAD with scattered 20-30% stenosis.    Nonischemic cardiomyopathy ([place])    a. EF 20-25% by echo in 09/2017 with cath showing mild CAD. b.  Last echo 12/2017 EF 35-40%, grade 2 DD.   Obesity    PAF (paroxysmal atrial fibrillation) ([place])    Sleep apnea    cpap, pt says no longer has    Patient Active Problem List   Diagnosis Date Noted   COVID-19  virus detected 06/18/2019   Right sided weakness 06/17/2019   AKI (acute kidney injury) ([place]) 06/17/2019   Foot pain, bilateral 02/01/2019   Abdominal pain 01/23/2019   Chest pain 01/23/2019   Right leg pain 12/20/2018   Overgrown toenails 12/20/2018   Unsteady gait 12/20/2018   Encounter for therapeutic drug monitoring 10/22/2018   Nausea and vomiting 03/10/2018   AF (paroxysmal atrial fibrillation) ([place]) 03/10/2018   Non-cardiac chest pain 03/09/2018   Diabetic neuropathy ([place]) 09/26/2017   MCI (mild cognitive impairment) 06/23/2017   OSA on CPAP 05/10/2017   Gastroesophageal reflux disease 05/10/2017   Insomnia 05/10/2017   Onychomycosis of multiple toenails with type 2 diabetes mellitus and peripheral neuropathy ([place]) 61/95/0932   Chronic systolic heart failure ([place]) 11/22/2015   Essential hypertension 11/22/2015   DM (diabetes mellitus) ([place]) 11/20/2015   HLD (hyperlipidemia) 11/20/2015   Hx of adenomatous colonic polyps 05/19/2011    Past Surgical History:  Procedure Laterality Date   COLONOSCOPY  05/13/11   9 adenomas   FOREARM SURGERY     MUSCLE BIOPSY     RIGHT/LEFT HEART CATH AND CORONARY ANGIOGRAPHY N/A 08/25/2017   Procedure: RIGHT/LEFT HEART CATH AND CORONARY ANGIOGRAPHY;  Surgeon: [name], MD;  Location: [facility];  Service: Cardiovascular;  Laterality: N/A;       Family History  Problem Relation Age of Onset   Heart disease  Mother    Heart attack Mother 72   Hypertension Mother    Hyperlipidemia Mother    Diabetes Mother    Heart disease Father    Heart attack Father 29   Hypertension Father    Hyperlipidemia Father    Diabetes Father    Heart attack Sister 5   Colon cancer Neg Hx    Stomach cancer Neg Hx    Esophageal cancer Neg Hx    Rectal cancer Neg Hx     Social History   Tobacco Use   Smoking status: Never Smoker   Smokeless tobacco: Never Used  Substance Use Topics     Alcohol use: No   Drug use: No    Home Medications Prior to Admission medications   Medication Sig Start Date End Date Taking? Authorizing Provider  albuterol (VENTOLIN HFA) 108 (90 Base) MCG/ACT inhaler Inhale 2 puffs into the lungs every 6 (six) hours as needed for wheezing or shortness of breath. 06/30/19   Thurnell Lose, MD  amitriptyline (ELAVIL) 10 MG tablet Take 1 tablet at bed time for one week and then increase 2 tablets at bed time Patient taking differently: Take 20 mg by mouth at bedtime.  05/14/19   Cameron Sprang, MD  apixaban (ELIQUIS) 5 MG TABS tablet Take 5 mg by mouth 2 (two) times daily.    [provider]  carvedilol (COREG) 6.25 MG tablet Take 1 tablet (6.25 mg total) by mouth 2 (two) times daily with a meal. 09/27/18   Larey Dresser, MD  donepezil (ARICEPT) 10 MG tablet Take 1 tablet (10 mg total) by mouth at bedtime. 09/18/18   Cameron Sprang, MD  Evolocumab (REPATHA) 140 MG/ML SOSY Inject 140 mg into the skin every 14 (fourteen) days.     [provider]  ezetimibe (ZETIA) 10 MG tablet Take 1 tablet (10 mg total) by mouth daily. 04/11/19   [name], MD  glucose blood (CONTOUR TEST) test strip 1 each by Other route 2 (two) times a day. 02/01/19   [name], MD  Insulin Degludec (TRESIBA FLEXTOUCH) 200 UNIT/ML SOPN Inject 220 Units into the skin daily. And pen needles 2/day 06/13/19   [name], MD  Insulin Syringe-Needle U-100 (INSULIN SYRINGE .3CC/29GX1/2") 29G X 1/2" 0.3 ML MISC Inject 1 Syringe 3 (three) times daily as directed. Check blood sugar three times daily. Dx: E11.9 05/10/17   [name], DO  lisinopril (PRINIVIL,ZESTRIL) 10 MG tablet Take 1 tablet (10 mg total) by mouth at bedtime. 09/27/18   Larey Dresser, MD  nitroGLYCERIN (NITROSTAT) 0.4 MG SL tablet Place 1 tablet (0.4 mg total) under the tongue every 5 (five) minutes as needed for chest pain. 10/27/17   [name], DO  polyethylene glycol (MIRALAX  / GLYCOLAX) 17 g packet Take 17 g by mouth 2 (two) times daily. 06/30/19   Thurnell Lose, MD  senna (SENOKOT) 8.6 MG TABS tablet Take 1 tablet (8.6 mg total) by mouth daily as needed for mild constipation. 06/30/19   Thurnell Lose, MD    Allergies    Patient has no known allergies.  Review of Systems   Review of Systems  Constitutional: Negative.   HENT: Negative.   Respiratory: Negative.   Cardiovascular: Negative.   Gastrointestinal: Positive for abdominal pain and constipation. Negative for abdominal distention, anal bleeding, blood in stool, diarrhea, nausea, rectal pain and vomiting.  Genitourinary: Negative.   Musculoskeletal: Positive for back pain. Negative for neck pain  and neck stiffness.       Left lower back pain  Skin: Negative.   Neurological: Negative.   All other systems reviewed and are negative.   Physical Exam Updated Vital Signs BP (!) 150/82 (BP Location: Right Arm)    Pulse (!) 109    Temp 98.5 F (36.9 C) (Oral)    Resp 18    SpO2 97%   Physical Exam Vitals and nursing note reviewed. Exam conducted with a chaperone present.  Constitutional:      General: He is not in acute distress.    Appearance: He is well-developed. He is not ill-appearing, toxic-appearing or diaphoretic.  HENT:     Head: Normocephalic and atraumatic.     Mouth/Throat:     Mouth: Mucous membranes are moist.     Pharynx: Oropharynx is clear.  Eyes:     Pupils: Pupils are equal, round, and reactive to light.  Cardiovascular:     Rate and Rhythm: Normal rate and regular rhythm.     Pulses: Normal pulses.     Heart sounds: Normal heart sounds.  Pulmonary:     Effort: Pulmonary effort is normal. No respiratory distress.     Breath sounds: Normal breath sounds.  Abdominal:     General: Bowel sounds are normal. There is no distension.     Palpations: Abdomen is soft.     Tenderness: There is abdominal tenderness in the left lower quadrant. There is guarding. There is no  right CVA tenderness, left CVA tenderness or rebound. Negative signs include Murphy's sign, Rovsing's sign and McBurney's sign.     Hernia: No hernia is present.     Comments: Palpation to left lower quadrant with guarding.  No rebound.  No flank tenderness  Genitourinary:    Rectum: Normal.     Comments: Large amount of impacted stool.  There is some light brown stool seepage at his anal sphincter.  Patient with some disimpaction manually. Musculoskeletal:        General: Normal range of motion.     Cervical back: Normal range of motion and neck supple.     Comments: No midline lumbar tenderness.  He does have diffuse left spinal tenderness to his left lower quadrant.  Negative straight leg raise.  Ambulatory that difficulty.  No overlying skin changes.  No tenderness over SI or piriformis muscle.  Skin:    General: Skin is warm and dry.     Capillary Refill: Capillary refill takes less than 2 seconds.     Comments: Brisk capillary refill.  No rashes or lesions  Neurological:     Mental Status: He is alert.     Cranial Nerves: Cranial nerves are intact.     Sensory: Sensation is intact.     Motor: Motor function is intact.     Coordination: Coordination is intact.     Gait: Gait is intact.     Deep Tendon Reflexes: Reflexes are normal and symmetric.     Comments: Ambulatory in ED without difficulty.    ED Results / Procedures / Treatments   Labs (all labs ordered are listed, but only abnormal results are displayed) Labs Reviewed  BASIC METABOLIC PANEL - Abnormal; Notable for the following components:      Result Value   Glucose, Bld 217 (*)    All other components within normal limits  CBC WITH DIFFERENTIAL/PLATELET  URINALYSIS, ROUTINE W REFLEX MICROSCOPIC   MR lumbar 06/2019 IMPRESSION: 1. Small left foraminal disc protrusion at  L4-5, closely approximating and potentially irritating the exiting left L4 nerve root. 2. Mild left greater than right L4 foraminal stenosis  related to disc bulge and facet disease. 3. Additional minor for age degenerative disc disease elsewhere within the lumbar spine without significant stenosis or neural impingement. No other findings to explain patient's right lower extremity symptoms identified. 4. Diffusely decreased T1 marrow signal intensity, nonspecific, but most commonly related to anemia, smoking, or obesity. Correlation with history and laboratory values recommended.     EKG None  Radiology CT Abdomen Pelvis W Contrast  Result Date: 07/13/2019 CLINICAL DATA:  Left lower back pain. EXAM: CT ABDOMEN AND PELVIS WITH CONTRAST TECHNIQUE: Multidetector CT imaging of the abdomen and pelvis was performed using the standard protocol following bolus administration of intravenous contrast. CONTRAST:  136m OMNIPAQUE IOHEXOL 300 MG/ML  SOLN COMPARISON:  01/23/2019 FINDINGS: Lower chest: Peripheral ground-glass airspace concerning opacities in the lower lungs for pneumonia. COVID pneumonia could have this appearance. Heart is normal size. No effusions. Hepatobiliary: No focal hepatic abnormality. Gallbladder unremarkable. Pancreas: No focal abnormality or ductal dilatation. Spleen: No focal abnormality.  Normal size. Adrenals/Urinary Tract: No adrenal abnormality. No focal renal abnormality. No stones or hydronephrosis. Urinary bladder is unremarkable. Stomach/Bowel: Large stool burden throughout the colon, particularly rectosigmoid colon. Normal appendix. Stomach, large and small bowel grossly unremarkable. Vascular/Lymphatic: Aortic atherosclerosis. No enlarged abdominal or pelvic lymph nodes. Reproductive: No visible focal abnormality. Other: No free fluid or free air. Musculoskeletal: No acute bony abnormality. IMPRESSION: Peripheral ground-glass airspace opacities in the lower lobes. This could reflect pneumonia, including COVID pneumonia. Large stool burden throughout the colon, particularly rectosigmoid colon. Cannot exclude  fecal impaction. Scattered aortic atherosclerosis. Electronically Signed   By: KRolm BaptiseM.D.   On: 07/13/2019 23:24   CT L-SPINE NO CHARGE  Result Date: 07/13/2019 CLINICAL DATA:  59year old male with back pain. EXAM: CT LUMBAR SPINE WITHOUT CONTRAST TECHNIQUE: Multidetector CT imaging of the lumbar spine was performed without intravenous contrast administration. Multiplanar CT image reconstructions were also generated. COMPARISON:  CT of the abdomen pelvis dated 07/13/2019 and lumbar spine MRI dated 06/18/2019. FINDINGS: Segmentation: 5 lumbar type vertebrae. Alignment: Normal. Vertebrae: No acute fracture or focal pathologic process. Paraspinal and other soft tissues: Negative. Bibasilar patchy hazy density and mild bronchiectasis. Disc levels: No acute findings. The disc space heights are preserved. No significant degenerative changes. Mild diffuse disc bulge at L3-L4 and L4-L5. Evaluation of the disc and neural foramina however is very limited on this CT and better evaluated on the MRI of 06/18/2019. There is mild levoscoliosis centered at L3-L4. IMPRESSION: 1. No acute/traumatic lumbar spine pathology. 2. Mild diffuse disc bulge at L3-L4 and L4-L5. The disc and neural foramina are better evaluated on the MRI of 06/18/2019. Electronically Signed   By: AAnner CreteM.D.   On: 07/13/2019 23:33    Procedures Procedures (including critical care time)  Medications Ordered in ED Medications  mineral oil enema 1 enema (has no administration in time range)  sodium chloride 0.9 % bolus 1,000 mL (0 mLs Intravenous Stopped 07/13/19 2251)  morphine 4 MG/ML injection 4 mg (4 mg Intravenous Given 07/13/19 2125)  iohexol (OMNIPAQUE) 300 MG/ML solution 100 mL (100 mLs Intravenous Contrast Given 07/13/19 2316)      ED Course  I have reviewed the triage vital signs and the nursing notes.  Pertinent labs & imaging results that were available during my care of the patient were reviewed by me and considered  in my  medical decision making (see chart for details).  59 year old male, recent Covid diagnosis presents for evaluation of left lower quadrant abdominal pain and left back pain.  Afebrile, nonseptic, non-ill-appearing.  No radicular symptoms into the legs.  No overlying skin changes.  No recent injury or trauma.  Last bowel movement 3 days ago.  No melena or bright blood per rectum.  He is tolerating p.o. intake at home and is passing flatulence.  He does have significant tenderness to his left lower quadrant with guarding.  No rebound.  Normoactive bowel sounds.  No red flags for back pain.  No chest pain, shortness of breath, upper flank pain or over lungs to suggest PE as cause of his pain.  Labs obtained from triage.  Will obtain CT abdomen pelvis.  Patient did have an MRI of his lumbar spine in December, less than 1 month ago. He does not have symptoms or exam findings here today to suggest cauda equina.  Labs personally reviewed: CBC without leukocytosis, hemoglobin stable at 23.5 Metabolic panel with mild hyperglycemia to 17, no additional electrolyte, renal or liver abnormality UA pending  Patient with large amount of impacted stool in the rectum.  There are some light brown stool seepage at his anal sphincter.  Patient disimpacted manually with moderate light brown stool removal.  CT AP with moderate stool. Will give enema. CT lumbar with disc bulge   [facility] transfered to [facility] who will follow up with patient after enema. Patient likely to dc home with stool regime which I have discussed with patient already.    MDM Rules/Calculators/A&P                      Jourdyn Ferrin was evaluated in Emergency Department on 07/13/2019 for the symptoms described in the history of present illness. He was evaluated in the context of the global COVID-19 pandemic, which necessitated consideration that the patient might be at risk for infection with the SARS-CoV-2 virus that causes COVID-19.  Institutional protocols and algorithms that pertain to the evaluation of patients at risk for COVID-19 are in a state of rapid change based on information released by regulatory bodies including the CDC and federal and state organizations. These policies and algorithms were followed during the patient's care in the ED. Final Clinical Impression(s) / ED Diagnoses Final diagnoses:  Back pain  Impacted stool in rectum [facility])  Left lower quadrant abdominal pain    Rx / DC Orders ED Discharge Orders    None       Elianys Conry A, [name] 07/13/19 2338    Little, Wenda Overland, MD 07/14/19 1453

## 2019-07-13 NOTE — ED Provider Notes (Addendum)
59 year old male received at signout from [facility] pending enema and re-evaluation. Per her HPI:   "Calvin [name] is a 59 y.o. male with past medical history significant for diabetes, GERD, hypertension, hyperlipidemia, cardiomyopathy, A. fib who presents for evaluation of abdominal pain and back pain.  Patient states for the last 2 days he has had left lower quadrant abdominal pain that radiates into his back.  Patient states he has been able to have a bowel movement.  He has tried MiraLAX and Ex-Lax without resolve of symptoms.  Patient states he is passing flatulence.  He denies fever, chills, nausea, vomiting, chest pain, shortness of breath, recent falls, injuries, IV drug use, bowel or bladder incontinence, saddle paresthesia, melena or bright red blood per rectum.  Denies additional aggravating or alleviating factors.  Rates his current pain a 10/10.  States he has also had some generalized left lower back pain which is worse with movement.  No trauma. No prior hx of AAA, dissection.  History obtained from patient and past medical records.  No interpreter is used."  Physical Exam  BP (!) 167/96   Pulse (!) 115   Temp 97.9 F (36.6 C) (Oral)   Resp 16   SpO2 98%   Physical Exam Vitals and nursing note reviewed.  Constitutional:      General: He is not in acute distress.    Appearance: He is well-developed. He is not ill-appearing, toxic-appearing or diaphoretic.     Comments: Well-appearing.  No acute distress.  HENT:     Head: Normocephalic.  Eyes:     Conjunctiva/sclera: Conjunctivae normal.  Cardiovascular:     Rate and Rhythm: Normal rate and regular rhythm.     Heart sounds: No murmur.  Pulmonary:     Effort: Pulmonary effort is normal.  Abdominal:     General: There is no distension.     Palpations: Abdomen is soft.     Tenderness: There is no abdominal tenderness.  Musculoskeletal:     Cervical back: Neck supple.  Skin:    General: Skin is warm and dry.   Neurological:     Mental Status: He is alert.  Psychiatric:        Behavior: Behavior normal.     ED Course/Procedures     Procedures  MDM   59 year old male received at signout from PA [place] ending enema and reevaluation.  Please see her note for further work-up and medical decision making.  He was given a mineral oil enema and had a large bowel movement while in the ER.  He has a feeling much better and would like to be discharged home.  Will discharge the patient with bowel regimen per PA [name].  All questions answered.  He is hemodynamically stable in no acute distress.  Safe for discharge home with outpatient follow-up as indicated.     Joanne Gavel, [name] 70/35/00 [phone]    Delora Fuel, MD 82/99/37 0037  ADDENDUM: Notified by nursing staff that the patient's heart rate is in the 110s at discharge.  The patient's chart has been reviewed.  He is due for his nighttime dose of Coreg.  The patient did not have any signs of hypovolemia on my evaluation.  He is afebrile.  He is having no chest pain and had no electrolyte derangements.  The patient has been encouraged to go home and take his nighttime dose of Coreg for rate control.  Strict return precautions to the ER given.   Joline Maxcy  A, [name] 47/65/46 [id number]    Delora Fuel, MD 46/56/81 [phone]

## 2019-07-13 NOTE — ED Notes (Signed)
Pt out of room in CT

## 2019-07-13 NOTE — ED Triage Notes (Signed)
Patient reports left lower back pain onset 2 days ago , denies injury /no urinary discomfort or hematuria , pain increases with movement /changing positions .

## 2019-07-15 ENCOUNTER — Encounter: Payer: Self-pay | Admitting: *Deleted

## 2019-07-15 ENCOUNTER — Other Ambulatory Visit: Payer: Self-pay

## 2019-07-15 ENCOUNTER — Other Ambulatory Visit: Payer: Self-pay | Admitting: Pharmacist

## 2019-07-15 ENCOUNTER — Encounter: Payer: Self-pay | Admitting: Endocrinology

## 2019-07-15 ENCOUNTER — Other Ambulatory Visit: Payer: Self-pay | Admitting: *Deleted

## 2019-07-15 ENCOUNTER — Ambulatory Visit (INDEPENDENT_AMBULATORY_CARE_PROVIDER_SITE_OTHER): Payer: Medicare Other | Admitting: Endocrinology

## 2019-07-15 DIAGNOSIS — Z794 Long term (current) use of insulin: Secondary | ICD-10-CM

## 2019-07-15 DIAGNOSIS — E1140 Type 2 diabetes mellitus with diabetic neuropathy, unspecified: Secondary | ICD-10-CM | POA: Diagnosis not present

## 2019-07-15 MED ORDER — TOUJEO MAX SOLOSTAR 300 UNIT/ML ~~LOC~~ SOPN: 250.0000 [IU] | PEN_INJECTOR | SUBCUTANEOUS | 11 refills | Status: DC

## 2019-07-15 NOTE — Patient Outreach (Signed)
[facility]) Care Management  [place] 07/15/2019  Donaciano Range [birth year] [phone]  Communication received from [facility] RN that patient will run out of Tresiba insulin tomorrow.  He has contacted endocrinologist regarding samples.  Tresiba PAP application mailed to patient on 07/09/2019.   Per review of chart, patient had virtual office visit with provider today and insulin changed from Tresiba (200 units/mL) 220 units per day --> Toujeo (300 units/mL) 250 units per day.  Unclear if samples available.  RX sent to [facility].    Call placed to [place] Patient Assistance Program.  Per representative, patient may be eligible for TAP (Temporary Access Program) for 30 day emergency supply if application submitted with voucher information provided to pharmacy for immediate fill.  If Toujeo covered through Medicare Part D, patient will have to spend 2% of annual income on copays before he will qualify for annual program.  If Toujeo NOT covered, patient does not have to spent 2% of annual income.    Call placed to EMT Marylouise Stacks.  EMT has been trying to get in touch with patient and can bring Toujeo patient assistance program application with her to visit patient.  We reviewed that patient will need to submit proof of income with application.  EMT can fax to [facility] or drop at office if patient able to complete.   Call placed to [place].  Toujeo RX for 28 day supply is $142 which includes the $99 deductible patient has to pay.  Patient reported to pharmacy he will pick up Toujeo on Wednesday when he gets paid.    Plan: Will route note to [place] and EMT to provide update re: insulin pick-up Wednesday Will route note to [place] technician to re-send application to patient for Toujeo instead of Tresiba due to change in therapy today  Ralene Bathe, PharmD, [place] [phone]

## 2019-07-15 NOTE — Progress Notes (Signed)
Subjective:    Patient ID: Calvin [name], male    DOB: 1960-08-19, 59 y.o.   MRN: [id number]  HPI telehealth visit today via phone x 9 minutes Alternatives to telehealth are presented to this patient, and the patient agrees to the telehealth visit.  Pt is advised of the cost of the visit, and agrees to this, also.   Patient is at home, and I am at the office.   Persons attending the telehealth visit: the patient and I.   Pt returns for f/u of diabetes mellitus: DM type: Insulin-requiring type 2. Dx'ed: 9242 Complications: polyneuropathy, CAD, renal insuff, and DR.   Therapy: insulin since soon after dx.  DKA: never Severe hypoglycemia: never.   Pancreatitis: never Pancreatic imaging: normal on 2003 CT. Other: due to noncompliance, he is not a candidate for multiple daily injections; he is retired.     Interval history: pt says cbg's are in the 300's  Pt says he takes the [place] as rx'ed, but he cannot afford to buy any more.  He recently got out of the hospital, after rx for coronavirus infection.  No recent steroid rx.   Past Medical History:  Diagnosis Date  . Adenoidal hypertrophy   . Adenomatous colon polyps 2012  . Arthritis   . Diabetes mellitus   . GERD (gastroesophageal reflux disease)   . Heart attack ([place])    While living in [place]  [name] Hx of adenomatous colonic polyps 08/18/2017  . Hyperlipidemia   . Hypertension   . Mild CAD    a. cath in 08/2017 showing mild nonobstructive CAD with scattered 20-30% stenosis.   . Nonischemic cardiomyopathy ([place])    a. EF 20-25% by echo in 09/2017 with cath showing mild CAD. b.  Last echo 12/2017 EF 35-40%, grade 2 DD.  [name] Obesity   . PAF (paroxysmal atrial fibrillation) ([place])   . Sleep apnea    cpap, pt says no longer has    Past Surgical History:  Procedure Laterality Date  . COLONOSCOPY  05/13/11   9 adenomas  . FOREARM SURGERY    . MUSCLE BIOPSY    . RIGHT/LEFT HEART CATH AND CORONARY ANGIOGRAPHY N/A 08/25/2017   Procedure: RIGHT/LEFT HEART CATH AND CORONARY ANGIOGRAPHY;  Surgeon: [name], MD;  Location: [facility];  Service: Cardiovascular;  Laterality: N/A;    Social History   Socioeconomic History  . Marital status: Single    Spouse name: Not on file  . Number of children: 0  . Years of education: Not on file  . Highest education level: Not on file  Occupational History  . Occupation: Disability  Tobacco Use  . Smoking status: Never Smoker  . Smokeless tobacco: Never Used  Substance and Sexual Activity  . Alcohol use: No  . Drug use: No  . Sexual activity: Yes    Birth control/protection: None  Other Topics Concern  . Not on file  Social History Narrative   Social History   Diet?    Do you drink/eat things with caffeine? yes   Marital status?       single      Do you live in a house, apartment, assisted living, condo, trailer, etc.? yes   Is it one or more stories? One story   How many persons live in your home?   Do you have any pets in your home? (please list)    Highest level of education completed? graduate   Do you exercise?  no                          Type & how often?   Advanced Directives   Do you have a living will? no   Do you have a DNR form?                                  If not, do you want to discuss one? no   Do you have signed POA/HPOA for forms? no      Functional Status   Do you have difficulty bathing or dressing yourself? no   Do you have difficulty preparing food or eating? no   Do you have difficulty managing your medications? no   Do you have difficulty managing your finances? no   Do you have difficulty affording your medications? no   Social Determinants of Health   Financial Resource Strain:   . Difficulty of Paying Living Expenses: Not on file  Food Insecurity: No Food Insecurity  . Worried About Charity fundraiser in the Last Year: Never true  . Ran Out of Food in the Last Year: Never true  Transportation  Needs: Unmet Transportation Needs  . Lack of Transportation (Medical): Yes  . Lack of Transportation (Non-Medical): Yes  Physical Activity:   . Days of Exercise per Week: Not on file  . Minutes of Exercise per Session: Not on file  Stress:   . Feeling of Stress : Not on file  Social Connections:   . Frequency of Communication with Friends and Family: Not on file  . Frequency of Social Gatherings with Friends and Family: Not on file  . Attends Religious Services: Not on file  . Active Member of Clubs or Organizations: Not on file  . Attends Archivist Meetings: Not on file  . Marital Status: Not on file  Intimate Partner Violence:   . Fear of Current or Ex-Partner: Not on file  . Emotionally Abused: Not on file  . Physically Abused: Not on file  . Sexually Abused: Not on file    Current Outpatient Medications on File Prior to Visit  Medication Sig Dispense Refill  . albuterol (VENTOLIN HFA) 108 (90 Base) MCG/ACT inhaler Inhale 2 puffs into the lungs every 6 (six) hours as needed for wheezing or shortness of breath. 6.7 g 0  . amitriptyline (ELAVIL) 10 MG tablet Take 1 tablet at bed time for one week and then increase 2 tablets at bed time (Patient taking differently: Take 20 mg by mouth at bedtime. ) 60 tablet 3  . apixaban (ELIQUIS) 5 MG TABS tablet Take 5 mg by mouth 2 (two) times daily.    . carvedilol (COREG) 6.25 MG tablet Take 1 tablet (6.25 mg total) by mouth 2 (two) times daily with a meal. 180 tablet 3  . donepezil (ARICEPT) 10 MG tablet Take 1 tablet (10 mg total) by mouth at bedtime. 90 tablet 3  . Evolocumab (REPATHA) 140 MG/ML SOSY Inject 140 mg into the skin every 14 (fourteen) days.     [name] ezetimibe (ZETIA) 10 MG tablet Take 1 tablet (10 mg total) by mouth daily. 90 tablet 3  . glucose blood (CONTOUR TEST) test strip 1 each by Other route 2 (two) times a day. 200 each 3  . Insulin Syringe-Needle U-100 (INSULIN SYRINGE .3CC/29GX1/2") 29G X 1/2" 0.3 ML MISC Inject  1  Syringe 3 (three) times daily as directed. Check blood sugar three times daily. Dx: E11.9 100 each 3  . lisinopril (PRINIVIL,ZESTRIL) 10 MG tablet Take 1 tablet (10 mg total) by mouth at bedtime. 90 tablet 3  . nitroGLYCERIN (NITROSTAT) 0.4 MG SL tablet Place 1 tablet (0.4 mg total) under the tongue every 5 (five) minutes as needed for chest pain. 30 tablet 1  . polyethylene glycol (MIRALAX / GLYCOLAX) 17 g packet Take 17 g by mouth 2 (two) times daily. 14 each 0  . senna (SENOKOT) 8.6 MG TABS tablet Take 1 tablet (8.6 mg total) by mouth daily as needed for mild constipation. 15 tablet 0   No current facility-administered medications on file prior to visit.    No Known Allergies  Family History  Problem Relation Age of Onset  . Heart disease Mother   . Heart attack Mother 37  . Hypertension Mother   . Hyperlipidemia Mother   . Diabetes Mother   . Heart disease Father   . Heart attack Father 73  . Hypertension Father   . Hyperlipidemia Father   . Diabetes Father   . Heart attack Sister 28  . Colon cancer Neg Hx   . Stomach cancer Neg Hx   . Esophageal cancer Neg Hx   . Rectal cancer Neg Hx     There were no vitals taken for this visit.  Review of Systems He has lost 7 lbs recently.     Objective:   Physical Exam       Assessment & Plan:  Insulin-requiring type 2 DM, with renal insuff: he needs increased rx Noncompliance with insulin, poss due to cost.  We'll change to a covered insulin at the same dosage first, to see if that helps.   Patient Instructions  I have sent a prescription to your pharmacy, to change the insulin to "Toujeo," at the same amount. check your blood sugar twice a day.  vary the time of day when you check, between before the 3 meals, and at bedtime.  also check if you have symptoms of your blood sugar being too high or too low.  please keep a record of the readings and bring it to your next appointment here (or you can bring the meter itself).  You  can write it on any piece of paper.  please call us sooner if your blood sugar goes below 70, or if you have a lot of readings over 200. Please come back for a follow-up appointment in 2 weeks.

## 2019-07-15 NOTE — Patient Outreach (Signed)
[facility]) Care Management  07/15/2019  Calvin [name] May 02, 1961 [id number]   Received member from previously assigned care manager due to change in insurance from [facility] to [facility].  Per chart, he was recently admitted to hospital for Covid infection, discharged on 12/28.  Presented again to the ED on 1/9 for constipation.  Noted to have history of diabetes (A1C of 11.9 on 12/14), CHF, HTN, A-fib, and HLD.  Initial post discharge call was completed by previously assigned care manager, primary MD office completed transition of care assessment.  Call placed to member to follow up on recent ED visit and current management of care, identity verified.  This care manager introduced self and stated purpose of call.  He report he is doing well, denies any lingering shortness of breath since discharge.  He also report his constipation is better now.  His blood sugar today was 368, report immediate concern for Tresiba as hs only has one more dose for today.  State once he changed his insurance he was told his copay for insulin would be $45 but was most recently told that it would be $1000.  Noted that [facility] pharmacist is involved and providing resources for medication assistance.  Report while he is with his goddaughter, she has been providing meals.  State he has decreased his soda intake, now drinking diet drinks, will start drinking more water with flavored packs.  They are open to receiving more education on diabetic diet and portion control.  Has follow up appointment today with endocrinologist.  This care manager inquired about management of heart failure, report he does weigh himself daily, readings "up and down."  State he was 145 pounds yesterday, 147 pounds today.  Noted per chart that he had been active with paramedicine, advised that EMT was trying to contact him for a visit and left a message.  Advised to call back as soon as possible, offered contact information but he state he  already has it.    He has appointements with PCP on 1/13, pulmonology on 1/19, and with cardiology on 2/4.  State his goddaughter will provide transportation on 1/13 if the visit is not virtual but will possibly need assistance with other appointments.  Social worker already involved, will provide update.  Denies any urgent concerns, will follow up within the next 2 weeks.  Fall Risk  07/15/2019 05/01/2019 12/20/2018 08/06/2018 05/09/2018  Falls in the past year? 0 0 1 0 0  Number falls in past yr: 0 0 0 0 0  Injury with Fall? 0 0 0 0 0  Risk for fall due to : No Fall Risks History of fall(s);Impaired balance/gait;Impaired mobility;Medication side effect;Mental status change - - -  Follow up - Falls evaluation completed;Education provided;Falls prevention discussed - - -   Depression screen [facility] 2/9 07/15/2019 05/01/2019 04/26/2018 06/20/2017  Decreased Interest 0 0 0 0  Down, Depressed, Hopeless 0 0 0 0  PHQ - 2 Score 0 0 0 0  Some recent data might be hidden   THN CM Care Plan Problem One     Most Recent Value  Care Plan Problem One  Knowledge deficit related to disease process and mgmt of Diabetes  Role Documenting the Problem One  Care Management Telephonic [place] for Problem One  Active  [facility] Long Term Goal   Patient will report a lowering/decrease in A1C level of 11.0 over the next 90 days.  THN Long Term Goal Start Date  05/01/19  Interventions  for Problem One Long Term Goal  Provided education on managing diabetes and complications of uncontrolled diabetes.  Provided with EMMI education on diabetes management  THN CM Short Term Goal #2   Patient will report a decrease in sugary drinks intake daily over the next 30 days.  THN CM Short Term Goal #2 Start Date  05/01/19  [facility] CM Short Term Goal #2 Met Date  07/17/19    [facility] CM Care Plan Problem Two     Most Recent Value  Care Plan Problem Two  Patient at risk for falls.  Role Documenting the Problem Two  Care Management  Telephonic [place] for Problem Two  Active  Interventions for Problem Two Long Term Goal   Educated on importance of using cane/walker for ambulation.  Discussed involvement with home health to increase strength  THN Long Term Goal  Patient will report no falls in the home over the next 90 days.  THN Long Term Goal Start Date  05/01/19    [facility] CM Care Plan Problem Three     Most Recent Value  Care Plan Problem Three  Patient at risk for hospital readmission due to co-morbidities.  Role Documenting the Problem Three  Care Management Telephonic Coordinator  Care Plan for Problem Three  Active  THN Long Term Goal   Patient will have no readmission to hospital over the next 31 days.  THN Long Term Goal Start Date  07/08/19  Interventions for Problem Three Long Term Goal  Reviewed discharge instructions with member.  Educated on importance of following plan of care in effort to decrease risk of readmission  THN CM Short Term Goal #1   Patient will complete all MD follow up appts in the next 30 days.  THN CM Short Term Goal #1 Start Date  07/08/19  Interventions for Short Term Goal #1  Follow up appointments reviewed with member.  Assessed need for transportation.  Educated on services through [facility], will update social worker regarding need for transportation assistance  [facility] CM Short Term Goal #2   Patient will report adhering to med regimen 100% of the time over the next 30 days.  THN CM Short Term Goal #2 Start Date  07/08/19  Interventions for Short Term Goal #2  Reviewed medications with member, educated on importance of taking as insructed.  Collaborated with [facility] pharmacist in effort to obtain insulin     [name], RN, MSN [place] [phone]

## 2019-07-15 NOTE — Patient Instructions (Signed)
I have sent a prescription to your pharmacy, to change the insulin to "Toujeo," at the same amount. check your blood sugar twice a day.  vary the time of day when you check, between before the 3 meals, and at bedtime.  also check if you have symptoms of your blood sugar being too high or too low.  please keep a record of the readings and bring it to your next appointment here (or you can bring the meter itself).  You can write it on any piece of paper.  please call us sooner if your blood sugar goes below 70, or if you have a lot of readings over 200. Please come back for a follow-up appointment in 2 weeks.

## 2019-07-16 ENCOUNTER — Other Ambulatory Visit: Payer: Self-pay

## 2019-07-16 ENCOUNTER — Ambulatory Visit: Payer: Self-pay | Admitting: Pharmacist

## 2019-07-16 NOTE — Patient Outreach (Signed)
[facility]) Care Management  07/16/2019  Arby Dahir 09/07/1960 [id number]   Follow up call to patient today.  Patient reports that all belongings have been moved out of apartment and he has turned key in.  He is longer asking that I communicate with Regional Manager of complex and stated "I'm just ready to move on and find somewhere else."  Patient still residing with goddaughter who is assisting with search for new apartment.  Informed him about socialserve.com as resource for locating housing. Talked with patient about Medicaid eligibility and application process.  He asked that application be mailed to him and said that goddaughter can assist with completion.  Will follow up within the next two weeks to ensure receipt and assist with completion if needed.  [name], BSW Social Worker [phone]

## 2019-07-17 ENCOUNTER — Encounter: Payer: Self-pay | Admitting: Nurse Practitioner

## 2019-07-17 ENCOUNTER — Other Ambulatory Visit: Payer: Self-pay

## 2019-07-17 ENCOUNTER — Ambulatory Visit (INDEPENDENT_AMBULATORY_CARE_PROVIDER_SITE_OTHER): Payer: Medicare Other | Admitting: Nurse Practitioner

## 2019-07-17 VITALS — BP 138/88 | HR 91 | Temp 97.3°F | Ht 64.0 in | Wt 156.0 lb

## 2019-07-17 DIAGNOSIS — N179 Acute kidney failure, unspecified: Secondary | ICD-10-CM | POA: Diagnosis not present

## 2019-07-17 DIAGNOSIS — R531 Weakness: Secondary | ICD-10-CM | POA: Diagnosis not present

## 2019-07-17 DIAGNOSIS — U071 COVID-19: Secondary | ICD-10-CM

## 2019-07-17 DIAGNOSIS — I10 Essential (primary) hypertension: Secondary | ICD-10-CM

## 2019-07-17 DIAGNOSIS — E1140 Type 2 diabetes mellitus with diabetic neuropathy, unspecified: Secondary | ICD-10-CM

## 2019-07-17 DIAGNOSIS — G3184 Mild cognitive impairment, so stated: Secondary | ICD-10-CM

## 2019-07-17 DIAGNOSIS — K5904 Chronic idiopathic constipation: Secondary | ICD-10-CM

## 2019-07-17 DIAGNOSIS — Z794 Long term (current) use of insulin: Secondary | ICD-10-CM

## 2019-07-17 NOTE — Patient Instructions (Addendum)
Set a reminder on your phone to take your medication.   Please have someone who helps you come with you to your next appt.  To follow up in 6 weeks.  Diabetic education referral placed    Diabetes Mellitus and Nutrition, Adult When you have diabetes (diabetes mellitus), it is very important to have healthy eating habits because your blood sugar (glucose) levels are greatly affected by what you eat and drink. Eating healthy foods in the appropriate amounts, at about the same times every day, can help you:  Control your blood glucose.  Lower your risk of heart disease.  Improve your blood pressure.  Reach or maintain a healthy weight. Every person with diabetes is different, and each person has different needs for a meal plan. Your health care provider may recommend that you work with a diet and nutrition specialist (dietitian) to make a meal plan that is best for you. Your meal plan may vary depending on factors such as:  The calories you need.  The medicines you take.  Your weight.  Your blood glucose, blood pressure, and cholesterol levels.  Your activity level.  Other health conditions you have, such as heart or kidney disease. How do carbohydrates affect me? Carbohydrates, also called carbs, affect your blood glucose level more than any other type of food. Eating carbs naturally raises the amount of glucose in your blood. Carb counting is a method for keeping track of how many carbs you eat. Counting carbs is important to keep your blood glucose at a healthy level, especially if you use insulin or take certain oral diabetes medicines. It is important to know how many carbs you can safely have in each meal. This is different for every person. Your dietitian can help you calculate how many carbs you should have at each meal and for each snack. Foods that contain carbs include:  Bread, cereal, rice, pasta, and crackers.  Potatoes and corn.  Peas, beans, and lentils.  Milk  and yogurt.  Fruit and juice.  Desserts, such as cakes, cookies, ice cream, and candy. How does alcohol affect me? Alcohol can cause a sudden decrease in blood glucose (hypoglycemia), especially if you use insulin or take certain oral diabetes medicines. Hypoglycemia can be a life-threatening condition. Symptoms of hypoglycemia (sleepiness, dizziness, and confusion) are similar to symptoms of having too much alcohol. If your health care provider says that alcohol is safe for you, follow these guidelines:  Limit alcohol intake to no more than 1 drink per day for nonpregnant women and 2 drinks per day for men. One drink equals 12 oz of beer, 5 oz of wine, or 1 oz of hard liquor.  Do not drink on an empty stomach.  Keep yourself hydrated with water, diet soda, or unsweetened iced tea.  Keep in mind that regular soda, juice, and other mixers may contain a lot of sugar and must be counted as carbs. What are tips for following this plan?  Reading food labels  Start by checking the serving size on the "Nutrition Facts" label of packaged foods and drinks. The amount of calories, carbs, fats, and other nutrients listed on the label is based on one serving of the item. Many items contain more than one serving per package.  Check the total grams (g) of carbs in one serving. You can calculate the number of servings of carbs in one serving by dividing the total carbs by 15. For example, if a food has 30 g of total carbs,  it would be equal to 2 servings of carbs.  Check the number of grams (g) of saturated and trans fats in one serving. Choose foods that have low or no amount of these fats.  Check the number of milligrams (mg) of salt (sodium) in one serving. Most people should limit total sodium intake to less than 2,300 mg per day.  Always check the nutrition information of foods labeled as "low-fat" or "nonfat". These foods may be higher in added sugar or refined carbs and should be  avoided.  Talk to your dietitian to identify your daily goals for nutrients listed on the label. Shopping  Avoid buying canned, premade, or processed foods. These foods tend to be high in fat, sodium, and added sugar.  Shop around the outside edge of the grocery store. This includes fresh fruits and vegetables, bulk grains, fresh meats, and fresh dairy. Cooking  Use low-heat cooking methods, such as baking, instead of high-heat cooking methods like deep frying.  Cook using healthy oils, such as olive, canola, or sunflower oil.  Avoid cooking with butter, cream, or high-fat meats. Meal planning  Eat meals and snacks regularly, preferably at the same times every day. Avoid going long periods of time without eating.  Eat foods high in fiber, such as fresh fruits, vegetables, beans, and whole grains. Talk to your dietitian about how many servings of carbs you can eat at each meal.  Eat 4-6 ounces (oz) of lean protein each day, such as lean meat, chicken, fish, eggs, or tofu. One oz of lean protein is equal to: ? 1 oz of meat, chicken, or fish. ? 1 egg. ?  cup of tofu.  Eat some foods each day that contain healthy fats, such as avocado, nuts, seeds, and fish. Lifestyle  Check your blood glucose regularly.  Exercise regularly as told by your health care provider. This may include: ? 150 minutes of moderate-intensity or vigorous-intensity exercise each week. This could be brisk walking, biking, or water aerobics. ? Stretching and doing strength exercises, such as yoga or weightlifting, at least 2 times a week.  Take medicines as told by your health care provider.  Do not use any products that contain nicotine or tobacco, such as cigarettes and e-cigarettes. If you need help quitting, ask your health care provider.  Work with a Social worker or diabetes educator to identify strategies to manage stress and any emotional and social challenges. Questions to ask a health care provider  Do  I need to meet with a diabetes educator?  Do I need to meet with a dietitian?  What number can I call if I have questions?  When are the best times to check my blood glucose? Where to find more information:  American Diabetes Association: diabetes.org  Academy of Nutrition and Dietetics: www.eatright.[employer] of Diabetes and Digestive and Kidney Diseases (NIH): [employer] Summary  A healthy meal plan will help you control your blood glucose and maintain a healthy lifestyle.  Working with a diet and nutrition specialist (dietitian) can help you make a meal plan that is best for you.  Keep in mind that carbohydrates (carbs) and alcohol have immediate effects on your blood glucose levels. It is important to count carbs and to use alcohol carefully. This information is not intended to replace advice given to you by your health care provider. Make sure you discuss any questions you have with your health care provider. Document Revised: 06/02/2017 Document Reviewed: 07/25/2016 Elsevier Patient Education  2020 Elsevier  Inc.
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
## 2019-07-18 ENCOUNTER — Other Ambulatory Visit (HOSPITAL_COMMUNITY): Payer: Self-pay

## 2019-07-18 ENCOUNTER — Ambulatory Visit: Payer: Self-pay | Admitting: Pharmacist

## 2019-07-18 ENCOUNTER — Telehealth: Payer: Self-pay

## 2019-07-18 ENCOUNTER — Other Ambulatory Visit: Payer: Self-pay | Admitting: Pharmacist

## 2019-07-18 ENCOUNTER — Other Ambulatory Visit: Payer: Self-pay | Admitting: Pharmacy Technician

## 2019-07-18 NOTE — Telephone Encounter (Signed)
Company: [employer]  Document: [employer] PAP for [employer] Other records requested: None requested  All above requested information has been faxed successfully to [employer] listed above. Documents and fax confirmation have been placed in the faxed file for future reference.

## 2019-07-18 NOTE — Patient Outreach (Signed)
[facility]) Care Management  07/18/2019  Hansel Devan [birth year] [id number]                                       Medication Assistance Referral  Referral From: [place] / [employer] Patient application portion:  Mailed Provider application portion: Faxed  to [name] Provider address/fax verified via: Office website   Follow up:  Will follow up with patient in 10-14 business days to confirm application(s) have been received.  [name] [name] [place] Certified Pharmacy Technician [place] Management Direct Dial:[phone]

## 2019-07-18 NOTE — Patient Outreach (Signed)
[facility]) Care Management  [place] 07/18/2019  Hy Swiatek [birth year] [id number]  Successful call with Mr. Joss to f/u on Toujeo.  Patient reports he picked up Toujeo from pharmacy yesterday and started using it.  He states his CBGs are still in the "300s."  He is aware to watch for application for patient assistance program for Toujeo to arrive in the mail at God-Daughter's address.  EMT is at home with patient during our call and is helping fill patient's pillbox.    Call placed to EMT after her visit to discuss patient's insulin and possible need to assess his administration technique.  Patient may not be using correct technique if CBGs are not improving with increased dose and concentration of insulin.  EMT and I reviewed correct administration technique and she will try to watch him at her next home visit as well as double check his CBG on her own glucometer to assess if patient's home glucometer is working accurately.  Request already sent to endocrinologist earlier this month to send in RX for new glucometer and testing supplies to mail order pharmacy.    EMT also requests assistance with ordering refills for patient to be sent from [facility] mail order as patient has new insurance.  He can receive all Tier 1 medications at no charge through mail order.  Recommend he continue to buy Eliquis (Tier 3) from local pharmacy as co-pay for 3 month supply would be too expensive for patient to pay ($135/90 DS).    Medication assistance: Patient may benefit from re-applying to Extra Help to assess if patient qualifies for FULL Extra Help rather than Partial Extra Help as SSA increases FPL eligibility requirements each year.  Unclear if SS income will match increase in FPL increase.    Will work on having patient complete Toujeo PAP documents to have these ready once 2% TROOP met.  If patient unable to meet TROOP for [employer]i, only other option is to discuss possible  substitution to U-500 insulin pen and apply for program through [employer].    Plan: -[place] technician will f/u with patient and provider office regarding Toujeo PAP documents -Will request new refills from providers be sent to [facility] mail order ([facility]) -Will f/u with patient and EMT next week.   Ralene Bathe, PharmD, [place] [phone]
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
## 2019-07-19 ENCOUNTER — Telehealth: Payer: Self-pay

## 2019-07-19 ENCOUNTER — Telehealth: Payer: Self-pay | Admitting: *Deleted

## 2019-07-19 NOTE — Telephone Encounter (Signed)
It looks like we haven't prescribed anything except Nitroglycerin. Please Advise.

## 2019-07-19 NOTE — Telephone Encounter (Signed)
Home health nurse came to check on patient and patient was not home    Please call and advise

## 2019-07-19 NOTE — Telephone Encounter (Signed)
-----   Message from [name], NP sent at 07/19/2019  2:26 PM EST ----- Can you help me with this?  Thank you  ----- Message ----- From: Rudean Haskell, [facility] Sent: 07/19/2019   2:15 PM EST To: [name], NP  Hi Dr. [name],  Can you please send in new RXs (omitting Eliquis) that you Riad Wagley be prescribing for patient to his mail order pharmacy [facility]?  He switched insurance plans in 2021 to [facility].    Thanks so much, Ralene Bathe, PharmD, [place] [phone]

## 2019-07-22 ENCOUNTER — Other Ambulatory Visit: Payer: Self-pay | Admitting: Pharmacist

## 2019-07-22 MED ORDER — EZETIMIBE 10 MG PO TABS: 10.0000 mg | ORAL_TABLET | Freq: Every day | ORAL | 3 refills | Status: DC

## 2019-07-22 MED ORDER — REPATHA SURECLICK 140 MG/ML ~~LOC~~ SOAJ: 1.0000 "pen " | SUBCUTANEOUS | 3 refills | Status: DC

## 2019-07-22 MED ORDER — LISINOPRIL 10 MG PO TABS: 10.0000 mg | ORAL_TABLET | Freq: Every day | ORAL | 3 refills | Status: DC

## 2019-07-22 MED ORDER — CARVEDILOL 6.25 MG PO TABS: 6.2500 mg | ORAL_TABLET | Freq: Two times a day (BID) | ORAL | 3 refills | Status: DC

## 2019-07-22 NOTE — Telephone Encounter (Signed)
Sent msg to pharmacy staff and they are consulting with specialist.

## 2019-07-23 ENCOUNTER — Institutional Professional Consult (permissible substitution): Payer: Medicare HMO | Admitting: Pulmonary Disease

## 2019-07-23 ENCOUNTER — Telehealth: Payer: Self-pay | Admitting: *Deleted

## 2019-07-23 NOTE — Telephone Encounter (Signed)
It looks like he has lost weight since he had COVID. Has his appetite increased and do they feel like it is a good weight gain? Is he experiencing any shortness of breath? Chest pains? Any swelling?

## 2019-07-23 NOTE — Telephone Encounter (Signed)
Calvin [name] with Calvin [name] called and stated that patient has gained 7 pounds since last week. Patient is taking his medications as directed. Vitals normal. Stated that patient was in the hospital recently with Covid.  Please Advise.

## 2019-07-24 NOTE — Telephone Encounter (Signed)
Spoke with Inez Catalina with [facility] and she stated that patient has no fluid in extremities or abdomen. Stated that he is now eating 3 meals/day. No Chest Pains and No SOB.

## 2019-07-24 NOTE — Telephone Encounter (Signed)
Great sounds like a positive weight gain

## 2019-07-25 ENCOUNTER — Ambulatory Visit: Payer: Self-pay | Admitting: Pharmacist

## 2019-07-25 ENCOUNTER — Other Ambulatory Visit (HOSPITAL_COMMUNITY): Payer: Self-pay

## 2019-07-25 ENCOUNTER — Telehealth: Payer: Self-pay | Admitting: Neurology

## 2019-07-25 ENCOUNTER — Other Ambulatory Visit: Payer: Self-pay | Admitting: Pharmacist

## 2019-07-25 ENCOUNTER — Other Ambulatory Visit: Payer: Self-pay

## 2019-07-25 DIAGNOSIS — G3184 Mild cognitive impairment, so stated: Secondary | ICD-10-CM

## 2019-07-25 MED ORDER — AMITRIPTYLINE HCL 10 MG PO TABS: ORAL_TABLET | ORAL | 0 refills | Status: DC

## 2019-07-25 MED ORDER — DONEPEZIL HCL 10 MG PO TABS: 10.0000 mg | ORAL_TABLET | Freq: Every day | ORAL | 0 refills | Status: DC

## 2019-07-25 NOTE — Telephone Encounter (Signed)
Vandiver for refill until f/u, thanks

## 2019-07-25 NOTE — Patient Outreach (Signed)
[facility]) Care Management  [place] 07/25/2019  Calvin [name] March 17, 1961 [id number]    Successful call with patient to f/u on diabetes management and patient assistance program application for Toujeo.    Patient reports he still feels tired which has been normal for him since he was discharged from hospital in December. He reports he has a worsening cough though and has not called any provider yet to discuss.  He reports otherwise he is "doing alright."  He reports he is doing PT therapy and going for walks.  He remains living with his [name] and states he is going to go to [place] "soon" to officially change his address.  I encouraged him to reach out to provider if cough worsens / does not improve in the next few days.    Medication assistance:  Calvin [name] states he "thinks" he has received the patient assistance program application for Toujeo and will try to return in mail tomorrow to [facility].  He is aware he will need to include proof of income documents for application.      Assisted patient with applying on-line for Extra Help in 2021 in case he is now eligible to switch to FULL Extra Help from Partial Extra Help.    Medication management:  Refills:  -request placed with all providers for refills on medications to be sent to [facility] mail order ([facility]) omitting Tier 3 medications as these are too expensive for him to pay for 3 month supply up front.  Tier 3 medications will be filled at local pharmacy [facility]).   -Cardiology sent in refills on 1/18 for ezetimibe, carvedilol, lisinopril, and Repatha. Repatha co-pay will be covered with grant foundation money approved in 2020.   -Neurology requested patient make appt before refills renewed as > 1 year since last visit.  3-way call to Dr. Amparo [name] office and appt scheduled in Feb.  Refills for donepezil and amitriptyline sent to [facility].  -Endocrinology already sent in [place] recently, request  placed for glucometer earlier this month, unclear if this has been sent in yet to [facility], may need to request again  -PCP currently not prescribing any medications, only specialists  Adherence: -Patient reports adherence with pillbox filled by EMT last week.  Awaiting arrival of refills at new address (already updated address on file with [facility])    Diabetes:  Patient reports he is taking insulin consistently and using appropriate administration technique that we reviewed telephonically.  States CBGs over the last 2 days were 100 - 220 which are much improved.  We also reviewed hypoglycemia treatment in case CBGs continue to go down < 70 or symptomatic.   Care coordination call placed to [facility], EMT, to discuss patient.  She is planning on home visit with patient today and will also review CBG, insulin administration, and assist patient with Toujeo application if needed.     Plan: Will f/u with patient in 1-2 weeks or sooner if needed  Ralene Bathe, PharmD, [place] [phone]

## 2019-07-25 NOTE — Telephone Encounter (Signed)
Dr. Delice Lesch,  Are you alright with refilling Amitriptyline for pt? Last visit was 05/2018. He also takes Donepezil but he did not ask for that to be renewed.

## 2019-07-25 NOTE — Telephone Encounter (Signed)
Patient called to schedule a VV with Dr. Delice Lesch for next month. He would like to refill his Amitriptyline through [facility]. Thank you

## 2019-07-25 NOTE — Progress Notes (Signed)
Paramedicine Encounter    Patient ID: Calvin [name], male    DOB: [birth year], 59 y.o.   MRN: [id number]   [name], NP as PCP - General (Geriatric Medicine) [name], MD as PCP - Cardiology (Cardiology) [name], [name], MD as PCP - Advanced Heart Failure (Cardiology) [name], MD as Consulting Physician (Endocrinology) Rudean Haskell, [facility] as [place] Management (Pharmacist) Chrismon, Museum/gallery conservator as [place], [name], RN as [place] Management [name], [name], CPhT as [employer] banker, corporate)  Patient Active Problem List   Diagnosis Date Noted  . COVID-19 virus detected 06/18/2019  . Right sided weakness 06/17/2019  . AKI (acute kidney injury) ([place]) 06/17/2019  . Foot pain, bilateral 02/01/2019  . Abdominal pain 01/23/2019  . Chest pain 01/23/2019  . Right leg pain 12/20/2018  . Overgrown toenails 12/20/2018  . Unsteady gait 12/20/2018  . Encounter for therapeutic drug monitoring 10/22/2018  . Nausea and vomiting 03/10/2018  . AF (paroxysmal atrial fibrillation) ([place]) 03/10/2018  . Non-cardiac chest pain 03/09/2018  . Diabetic neuropathy ([place]) 09/26/2017  . MCI (mild cognitive impairment) 06/23/2017  . OSA on CPAP 05/10/2017  . Gastroesophageal reflux disease 05/10/2017  . Insomnia 05/10/2017  . Onychomycosis of multiple toenails with type 2 diabetes mellitus and peripheral neuropathy ([place]) 05/10/2017  . Chronic systolic heart failure ([place]) 11/22/2015  . Essential hypertension 11/22/2015  . DM (diabetes mellitus) ([place]) 11/20/2015  . HLD (hyperlipidemia) 11/20/2015  . Hx of adenomatous colonic polyps 05/19/2011    Current Outpatient Medications:  .  albuterol (VENTOLIN HFA) 108 (90 Base) MCG/ACT inhaler, Inhale 2 puffs into the lungs every 6 (six) hours as needed for wheezing or shortness of breath., Disp:  6.7 g, Rfl: 0 .  apixaban (ELIQUIS) 5 MG TABS tablet, Take 5 mg by mouth 2 (two) times daily., Disp: , Rfl:  .  carvedilol (COREG) 6.25 MG tablet, Take 1 tablet (6.25 mg total) by mouth 2 (two) times daily with a meal., Disp: 180 tablet, Rfl: 3 .  donepezil (ARICEPT) 10 MG tablet, Take 1 tablet (10 mg total) by mouth at bedtime., Disp: 90 tablet, Rfl: 0 .  ezetimibe (ZETIA) 10 MG tablet, Take 1 tablet (10 mg total) by mouth daily., Disp: 90 tablet, Rfl: 3 .  glucose blood (CONTOUR TEST) test strip, 1 each by Other route 2 (two) times a day., Disp: 200 each, Rfl: 3 .  Insulin Glargine, 2 Unit Dial, (TOUJEO MAX SOLOSTAR) 300 UNIT/ML SOPN, Inject 250 Units into the skin every morning. And pen needles 1/day, Disp: 8 pen, Rfl: 11 .  Insulin Syringe-Needle U-100 (INSULIN SYRINGE .3CC/29GX1/2") 29G X 1/2" 0.3 ML MISC, Inject 1 Syringe 3 (three) times daily as directed. Check blood sugar three times daily. Dx: E11.9, Disp: 100 each, Rfl: 3 .  lisinopril (ZESTRIL) 10 MG tablet, Take 1 tablet (10 mg total) by mouth at bedtime., Disp: 90 tablet, Rfl: 3 .  polyethylene glycol (MIRALAX / GLYCOLAX) 17 g packet, Take 17 g by mouth 2 (two) times daily., Disp: 14 each, Rfl: 0 .  amitriptyline (ELAVIL) 10 MG tablet, Take 1 tablet at bed time for one week and then increase 2 tablets at bed time (Patient not taking: Reported on 07/25/2019), Disp: 180 tablet, Rfl: 0 .  Evolocumab (REPATHA SURECLICK) 947 MG/ML SOAJ, Inject 1 pen into the skin every 14 (fourteen) days. (Patient not taking: Reported on 07/25/2019), Disp: 6 pen,  Rfl: 3 .  nitroGLYCERIN (NITROSTAT) 0.4 MG SL tablet, Place 1 tablet (0.4 mg total) under the tongue every 5 (five) minutes as needed for chest pain. (Patient not taking: Reported on 07/18/2019), Disp: 30 tablet, Rfl: 1 .  senna (SENOKOT) 8.6 MG TABS tablet, Take 1 tablet (8.6 mg total) by mouth daily as needed for mild constipation. (Patient not taking: Reported on 07/25/2019), Disp: 15 tablet, Rfl: 0 No  Known Allergies    Social History   Socioeconomic History  . Marital status: Single    Spouse name: Not on file  . Number of children: 0  . Years of education: Not on file  . Highest education level: Not on file  Occupational History  . Occupation: Disability  Tobacco Use  . Smoking status: Never Smoker  . Smokeless tobacco: Never Used  Substance and Sexual Activity  . Alcohol use: No  . Drug use: No  . Sexual activity: Yes    Birth control/protection: None  Other Topics Concern  . Not on file  Social History Narrative   Social History   Diet?    Do you drink/eat things with caffeine? yes   Marital status?       single      Do you live in a house, apartment, assisted living, condo, trailer, etc.? yes   Is it one or more stories? One story   How many persons live in your home?   Do you have any pets in your home? (please list)    Highest level of education completed? graduate   Do you exercise?            no                          Type & how often?   Advanced Directives   Do you have a living will? no   Do you have a DNR form?                                  If not, do you want to discuss one? no   Do you have signed POA/HPOA for forms? no      Functional Status   Do you have difficulty bathing or dressing yourself? no   Do you have difficulty preparing food or eating? no   Do you have difficulty managing your medications? no   Do you have difficulty managing your finances? no   Do you have difficulty affording your medications? no   Social Determinants of Health   Financial Resource Strain:   . Difficulty of Paying Living Expenses: Not on file  Food Insecurity: No Food Insecurity  . Worried About Charity fundraiser in the Last Year: Never true  . Ran Out of Food in the Last Year: Never true  Transportation Needs: Unmet Transportation Needs  . Lack of Transportation (Medical): Yes  . Lack of Transportation (Non-Medical): Yes  Physical Activity:   . Days of  Exercise per Week: Not on file  . Minutes of Exercise per Session: Not on file  Stress:   . Feeling of Stress : Not on file  Social Connections:   . Frequency of Communication with Friends and Family: Not on file  . Frequency of Social Gatherings with Friends and Family: Not on file  . Attends Religious Services: Not on file  . Active Member of Clubs or  Organizations: Not on file  . Attends Archivist Meetings: Not on file  . Marital Status: Not on file  Intimate Partner Violence:   . Fear of Current or Ex-Partner: Not on file  . Emotionally Abused: Not on file  . Physically Abused: Not on file  . Sexually Abused: Not on file    Physical Exam      Future Appointments  Date Time Provider [place]  07/29/2019 10:30 AM [name], RN THN-COM None  07/30/2019 10:00 AM Chrismon, Amber THN-COM None  08/06/2019  9:00 AM Summe, Colleen E, RPH THN-COM None  08/08/2019  2:45 PM Charlie Pitter, [name] [facility]  08/23/2019  2:00 PM Clydell Hakim, RD [place] NDM  08/27/2019  4:00 PM Cameron Sprang, MD LBN-LBNG None  08/28/2019  9:00 AM [name], NP PSC-PSC None    BP 136/78   Pulse 100   Temp 97.7 F (36.5 C)   Resp 18   Wt 164 lb (74.4 kg)   SpO2 99%   BMI 28.15 kg/m   Weight yesterday- Last visit weight-158 CBG EMS-264 His glucometer-275  Pt reports he is doing ok. He is c/o just feeling tired. More so than normal.  He is c/o having h/a. Also feeling like he gets hot then cold, he has had that in the past. Advised him to take tylenol He has neuro appoint in February  so he can get the amitriptyline refilled.  He has not taken his toujeo yet today. He has not checked his CBG yet today either.  He took his toujeo while I was here-did so correctly.  1/19-100 1/11-306 1/7-268  that is all on the memory of his machine.  He states breathing is doing the same, gets dizzy at times. Drinking fluids ok, appetite is good. Pt reports is  remaining active as he can tolerate. He is getting nurse care and PT.  meds verified and pill box refilled.   He needs to get back in to see clinic. Will call Monday to get appoint scheduled due to late time of the day and clinic is closed.    Marylouise Stacks, [place] Paramedic  07/25/19

## 2019-07-25 NOTE — Telephone Encounter (Signed)
3 month supply of each medication sent with 0 refills to pts mail order pharmacy today.

## 2019-07-29 ENCOUNTER — Other Ambulatory Visit: Payer: Self-pay | Admitting: *Deleted

## 2019-07-29 NOTE — Patient Outreach (Signed)
[facility]) Care Management  07/29/2019  Calvin [name] 18-May-1961 [id number]   Call placed to member to follow up on recovery from Covid infection and diabetes management.  He report he is still getting tired easy and still has cough at times, but report he is otherwise "fine."  He is using his inhaler a couple times a day, state he does have relief after use.  He has been working with PT, had visit today and will have visit with RN on Wednesday.  State he has been checking his blood sugar twice a day but has been having trouble with his meter, would like to have a new one.  Report he has bee compliant with daily weights, yesterday was 157 pounds, state it has remained in the 150's.    He has been seen by his endocrinologist and PCP since his discharge but has not been seen at the heart failure clinic.  He was seen by EMT through the paramedicine program, she will attempt to make appointment.  Has appointment with regular cardiologist on 2/4, registered dietician on 2/19, Neuro on 2/23 and with PCP on 2/24.  Denies any urgent concerns, will follow up within the next month.  THN CM Care Plan Problem One     Most Recent Value  Care Plan Problem One  Knowledge deficit related to disease process and mgmt of Diabetes  Role Documenting the Problem One  Care Management Telephonic Coordinator  Care Plan for Problem One  Active  [facility] Long Term Goal   Patient will report a lowering/decrease in A1C level of 11.0 over the next 90 days.  THN Long Term Goal Start Date  05/01/19  Interventions for Problem One Long Term Goal  Discussed appointment with dietician, assigned member EMMI education regarding diabetic diet    THN CM Care Plan Problem Two     Most Recent Value  Care Plan Problem Two  Patient at risk for falls.  Role Documenting the Problem Two  Care Management Telephonic [place] for Problem Two  Active  THN Long Term Goal  Patient will report no falls in the  home over the next 90 days.  THN Long Term Goal Start Date  05/01/19  THN Long Term Goal Met Date  07/29/19    [facility] CM Care Plan Problem Three     Most Recent Value  Care Plan Problem Three  Patient at risk for hospital readmission due to co-morbidities.  Role Documenting the Problem Three  Care Management Telephonic Coordinator  Care Plan for Problem Three  Active  THN Long Term Goal   Patient will have no readmission to hospital over the next 31 days.  THN Long Term Goal Start Date  07/08/19  Interventions for Problem Three Long Term Goal  Educated on importance of following plan of care (medications, MD appointments, paramedicine, and home health) in effort to decrease risk of readmission.  THN CM Short Term Goal #1   Patient will complete all MD follow up appts in the next 30 days.  THN CM Short Term Goal #1 Start Date  07/08/19  Interventions for Short Term Goal #1  Call placed to HF clinic to schedule next appointment, advised to call back for appointment, EMT made aware of need for appointment  [facility] CM Short Term Goal #2   Patient will report adhering to med regimen 100% of the time over the next 30 days.  THN CM Short Term Goal #2 Start Date  07/08/19  Interventions for Short Term Goal #2  Educated on importance of taking medications as prescribed in effort to decrease complications     [name], Therapist, sports, MSN [place] Manager [phone]

## 2019-07-30 ENCOUNTER — Ambulatory Visit: Payer: Medicare HMO | Admitting: Endocrinology

## 2019-07-30 ENCOUNTER — Other Ambulatory Visit: Payer: Self-pay

## 2019-07-30 NOTE — Patient Outreach (Signed)
[facility]) Care Management  07/30/2019  Calvin [name] 1961-01-27 [id number]   Follow up call to patient today to ensure receipt of Medicaid application.  Patient stated today that completed application was recently completed/mailed.  Patient stated that he mailed it in the envelope that was enclosed with documentation.  It seems that patient mailed completed application in envelope that is intended for [facility] consent.  Informed patient that application needs to be mailed to [place].  Informed patient that application will be forwarded to DSS if received at [facility] office.  Calvin [name] asking her to keep an eye out for application.  If application is not received at [facility] office by Friday of next week, will contact patient and mail new application. Closing social work case but will follow up with Web designer next week to determine if application has been received.    Calvin [name], BSW Social Worker [phone]

## 2019-08-01 ENCOUNTER — Other Ambulatory Visit (HOSPITAL_COMMUNITY): Payer: Self-pay

## 2019-08-01 NOTE — Progress Notes (Signed)
Paramedicine Encounter    Patient ID: Calvin [name], male    DOB: [birth year], 59 y.o.   MRN: [id number]   [name], NP as PCP - General (Geriatric Medicine) [name], MD as PCP - Cardiology (Cardiology) [name], [name], MD as PCP - Advanced Heart Failure (Cardiology) [name], MD as Consulting Physician (Endocrinology) Rudean Haskell, [facility] as [place] Management (Pharmacist) [name], RN as [place] Management Adaline Sill, CPhT as [employer] banker, corporate)  Patient Active Problem List   Diagnosis Date Noted  . COVID-19 virus detected 06/18/2019  . Right sided weakness 06/17/2019  . AKI (acute kidney injury) ([place]) 06/17/2019  . Foot pain, bilateral 02/01/2019  . Abdominal pain 01/23/2019  . Chest pain 01/23/2019  . Right leg pain 12/20/2018  . Overgrown toenails 12/20/2018  . Unsteady gait 12/20/2018  . Encounter for therapeutic drug monitoring 10/22/2018  . Nausea and vomiting 03/10/2018  . AF (paroxysmal atrial fibrillation) ([place]) 03/10/2018  . Non-cardiac chest pain 03/09/2018  . Diabetic neuropathy ([place]) 09/26/2017  . MCI (mild cognitive impairment) 06/23/2017  . OSA on CPAP 05/10/2017  . Gastroesophageal reflux disease 05/10/2017  . Insomnia 05/10/2017  . Onychomycosis of multiple toenails with type 2 diabetes mellitus and peripheral neuropathy ([place]) 05/10/2017  . Chronic systolic heart failure ([place]) 11/22/2015  . Essential hypertension 11/22/2015  . DM (diabetes mellitus) ([place]) 11/20/2015  . HLD (hyperlipidemia) 11/20/2015  . Hx of adenomatous colonic polyps 05/19/2011    Current Outpatient Medications:  .  albuterol (VENTOLIN HFA) 108 (90 Base) MCG/ACT inhaler, Inhale 2 puffs into the lungs every 6 (six) hours as needed for wheezing or shortness of breath., Disp: 6.7 g, Rfl: 0 .  amitriptyline (ELAVIL) 10 MG tablet, Take  1 tablet at bed time for one week and then increase 2 tablets at bed time, Disp: 180 tablet, Rfl: 0 .  apixaban (ELIQUIS) 5 MG TABS tablet, Take 5 mg by mouth 2 (two) times daily., Disp: , Rfl:  .  carvedilol (COREG) 6.25 MG tablet, Take 1 tablet (6.25 mg total) by mouth 2 (two) times daily with a meal., Disp: 180 tablet, Rfl: 3 .  donepezil (ARICEPT) 10 MG tablet, Take 1 tablet (10 mg total) by mouth at bedtime., Disp: 90 tablet, Rfl: 0 .  ezetimibe (ZETIA) 10 MG tablet, Take 1 tablet (10 mg total) by mouth daily., Disp: 90 tablet, Rfl: 3 .  glucose blood (CONTOUR TEST) test strip, 1 each by Other route 2 (two) times a day., Disp: 200 each, Rfl: 3 .  Insulin Glargine, 2 Unit Dial, (TOUJEO MAX SOLOSTAR) 300 UNIT/ML SOPN, Inject 250 Units into the skin every morning. And pen needles 1/day, Disp: 8 pen, Rfl: 11 .  Insulin Syringe-Needle U-100 (INSULIN SYRINGE .3CC/29GX1/2") 29G X 1/2" 0.3 ML MISC, Inject 1 Syringe 3 (three) times daily as directed. Check blood sugar three times daily. Dx: E11.9, Disp: 100 each, Rfl: 3 .  lisinopril (ZESTRIL) 10 MG tablet, Take 1 tablet (10 mg total) by mouth at bedtime., Disp: 90 tablet, Rfl: 3 .  polyethylene glycol (MIRALAX / GLYCOLAX) 17 g packet, Take 17 g by mouth 2 (two) times daily., Disp: 14 each, Rfl: 0 .  senna (SENOKOT) 8.6 MG TABS tablet, Take 1 tablet (8.6 mg total) by mouth daily as needed for mild constipation., Disp: 15 tablet, Rfl: 0 .  Evolocumab (REPATHA SURECLICK) 381 MG/ML SOAJ, Inject 1 pen into the skin  every 14 (fourteen) days. (Patient not taking: Reported on 07/25/2019), Disp: 6 pen, Rfl: 3 .  nitroGLYCERIN (NITROSTAT) 0.4 MG SL tablet, Place 1 tablet (0.4 mg total) under the tongue every 5 (five) minutes as needed for chest pain. (Patient not taking: Reported on 07/18/2019), Disp: 30 tablet, Rfl: 1 No Known Allergies    Social History   Socioeconomic History  . Marital status: Single    Spouse name: Not on file  . Number of children: 0   . Years of education: Not on file  . Highest education level: Not on file  Occupational History  . Occupation: Disability  Tobacco Use  . Smoking status: Never Smoker  . Smokeless tobacco: Never Used  Substance and Sexual Activity  . Alcohol use: No  . Drug use: No  . Sexual activity: Yes    Birth control/protection: None  Other Topics Concern  . Not on file  Social History Narrative   Social History   Diet?    Do you drink/eat things with caffeine? yes   Marital status?       single      Do you live in a house, apartment, assisted living, condo, trailer, etc.? yes   Is it one or more stories? One story   How many persons live in your home?   Do you have any pets in your home? (please list)    Highest level of education completed? graduate   Do you exercise?            no                          Type & how often?   Advanced Directives   Do you have a living will? no   Do you have a DNR form?                                  If not, do you want to discuss one? no   Do you have signed POA/HPOA for forms? no      Functional Status   Do you have difficulty bathing or dressing yourself? no   Do you have difficulty preparing food or eating? no   Do you have difficulty managing your medications? no   Do you have difficulty managing your finances? no   Do you have difficulty affording your medications? no   Social Determinants of Health   Financial Resource Strain:   . Difficulty of Paying Living Expenses: Not on file  Food Insecurity: No Food Insecurity  . Worried About Charity fundraiser in the Last Year: Never true  . Ran Out of Food in the Last Year: Never true  Transportation Needs: Unmet Transportation Needs  . Lack of Transportation (Medical): Yes  . Lack of Transportation (Non-Medical): Yes  Physical Activity:   . Days of Exercise per Week: Not on file  . Minutes of Exercise per Session: Not on file  Stress:   . Feeling of Stress : Not on file  Social  Connections:   . Frequency of Communication with Friends and Family: Not on file  . Frequency of Social Gatherings with Friends and Family: Not on file  . Attends Religious Services: Not on file  . Active Member of Clubs or Organizations: Not on file  . Attends Archivist Meetings: Not on file  . Marital Status: Not on  file  Intimate Partner Violence:   . Fear of Current or Ex-Partner: Not on file  . Emotionally Abused: Not on file  . Physically Abused: Not on file  . Sexually Abused: Not on file    Physical Exam      Future Appointments  Date Time Provider [place]  08/06/2019  9:00 AM Summe, Octavio Graves, RPH THN-COM None  08/08/2019  2:45 PM Charlie Pitter, [name] [facility]  08/23/2019  2:00 PM Clydell Hakim, RD [place] NDM  08/27/2019  4:00 PM Cameron Sprang, MD LBN-LBNG None  08/28/2019  9:00 AM [name], NP PSC-PSC None    BP (!) 148/82   Pulse 99   Temp (!) 97.5 F (36.4 C)   Resp 16   Wt 159 lb (72.1 kg)   SpO2 98%   BMI 27.29 kg/m   Weight yesterday-157 Last visit weight-164 CBG EMS-202  Pt reports he is doing ok. He reports his breathing has improved, less sob than last week.  He is wanting to go out today to look around for apartments for a place of his own. He still gets tired easily.  meds verified and pill box refilled.  I called clinic to sch appointment but no answer.  Will try again.    Marylouise Stacks, [place] Paramedic  08/01/19

## 2019-08-02 ENCOUNTER — Ambulatory Visit: Payer: Medicare HMO | Admitting: Nurse Practitioner

## 2019-08-05 ENCOUNTER — Other Ambulatory Visit: Payer: Self-pay

## 2019-08-05 ENCOUNTER — Ambulatory Visit: Payer: Medicare Other | Admitting: Cardiovascular Disease

## 2019-08-05 ENCOUNTER — Telehealth: Payer: Self-pay | Admitting: Physician Assistant

## 2019-08-05 ENCOUNTER — Ambulatory Visit: Payer: Medicare Other | Admitting: Internal Medicine

## 2019-08-05 ENCOUNTER — Encounter: Payer: Self-pay | Admitting: Cardiovascular Disease

## 2019-08-05 VITALS — BP 96/62 | HR 92 | Ht 64.0 in | Wt 162.0 lb

## 2019-08-05 DIAGNOSIS — I2510 Atherosclerotic heart disease of native coronary artery without angina pectoris: Secondary | ICD-10-CM

## 2019-08-05 DIAGNOSIS — R079 Chest pain, unspecified: Secondary | ICD-10-CM | POA: Diagnosis not present

## 2019-08-05 DIAGNOSIS — I480 Paroxysmal atrial fibrillation: Secondary | ICD-10-CM

## 2019-08-05 DIAGNOSIS — I10 Essential (primary) hypertension: Secondary | ICD-10-CM | POA: Diagnosis not present

## 2019-08-05 DIAGNOSIS — I428 Other cardiomyopathies: Secondary | ICD-10-CM

## 2019-08-05 NOTE — Telephone Encounter (Signed)
Patient stated he has been having chest pain, SOB, and cough for a while. Patient stated his chest pain comes and goes since before he went into the hospital with COVID. Patient stated his chest pain is sharp, and is on both sides. Patient stated he can be resting or active and it will come on. Patient stated coughing does not make the chest pain better or worse. Patient has not had any major weight fluctuations. Made patient an appointment with DOD today and informed patient if his symptoms got worse to go to the ED.

## 2019-08-05 NOTE — Patient Instructions (Signed)
Medication Instructions:  Your physician recommends that you continue on your current medications as directed. Please refer to the Current Medication list given to you today.  *If you need a refill on your cardiac medications before your next appointment, please call your pharmacy*  Lab Work: None Ordered If you have labs (blood work) drawn today and your tests are completely normal, you will receive your results only by: [name] MyChart Message (if you have MyChart) OR . A paper copy in the mail If you have any lab test that is abnormal or we need to change your treatment, we will call you to review the results.   Testing/Procedures: None Ordered   Follow-Up: At [facility], you and your health needs are our priority.  As part of our continuing mission to provide you with exceptional heart care, we have created designated Provider Care Teams.  These Care Teams include your primary Cardiologist (physician) and Advanced Practice Providers (APPs -  Physician Assistants and Nurse Practitioners) who all work together to provide you with the care you need, when you need it.  Your next appointment:   6 month(s)  The format for your next appointment:   Either In Person or Virtual  Provider:   Melina Copa, [name] or Ermalinda Barrios, [name]

## 2019-08-05 NOTE — Telephone Encounter (Signed)
New Message    Pt is calling about his appt on 02/04. He says he is still having symptoms from Covid, Headache, Chest Pain, and Cough    Please call

## 2019-08-05 NOTE — Progress Notes (Signed)
Chief Complaint  Patient presents with  . Follow-up    chest pain    History of Present Illness: 59 yo male with history of DM, CAD, HLD, HTN, obesity, sleep apnea, atrial fibrillation and GERD who is here today for follow up. I saw him once in 2019. He was seen by Dr. Recardo Evangelist in May 2017 as a consult at [facility]. He reports an MI in 54 in [place] but had no stents placed. He was admitted in May 2017 at [facility] with chest pain. He ruled out for an MI with serial troponin. Nuclear stress test with no ischemia. Echo in 2017 with LVEF=40. No valve disease. His cardiomyopathy was presumed to be non-ischemic. He was discharged on Lisinopril and Coreg. He missed follow-up appointments with Dr. Recardo Evangelist. Cardiac cath February 2019 with mild CAD. He was started on beta blocker and Ace-inh but did not tolerate Lisinopril due to low BP and dizziness. He was admitted to [facility] September 2019 with chest pain and was found to be in atrial fibrillation. He was started on Eliquis. He has been seen in our several times in 2019 with atypical chest pain. He has been followed in the Advanced Heart Failure clinic. He was started on Entresto in 2019 but this was stopped due to dizziness. Sleep study was normal. He was started on aldactone but never started due to cost. He was admitted to [facility] July 2020 with chest pain with chest wall tenderness. No ischemic evaluation was performed. Echo July 2020 with LVEF=35-40%.He was admitted to [facility] December 2020 with chest pain, dizziness and right sided weakness. MRI brain negative for stroke. He was Covid positive. His chest pain was felt to be non-cardiac.   He is here today for follow up. The patient denies any palpitations, lower extremity edema, orthopnea, PND, dizziness, near syncope or syncope. He describes chest pain that is his typical daily pain, across his chest and worse with palpation. He has ongoing dyspnea following his hospitalization with cough and sweating. He does not  think he has had fevers.   Primary Care Physician: [name], NP   Past Medical History:  Diagnosis Date  . Adenoidal hypertrophy   . Adenomatous colon polyps 2012  . Arthritis   . Diabetes mellitus   . GERD (gastroesophageal reflux disease)   . Heart attack ([place])    [place] living in [place]  [name] Hx of adenomatous colonic polyps 08/18/2017  . Hyperlipidemia   . Hypertension   . Mild CAD    a. cath in 08/2017 showing mild nonobstructive CAD with scattered 20-30% stenosis.   . Nonischemic cardiomyopathy ([place])    a. EF 20-25% by echo in 09/2017 with cath showing mild CAD. b.  Last echo 12/2017 EF 35-40%, grade 2 DD.  [name] Obesity   . PAF (paroxysmal atrial fibrillation) ([place])   . Sleep apnea    cpap, pt says no longer has    Past Surgical History:  Procedure Laterality Date  . COLONOSCOPY  05/13/11   9 adenomas  . FOREARM SURGERY    . MUSCLE BIOPSY    . RIGHT/LEFT HEART CATH AND CORONARY ANGIOGRAPHY N/A 08/25/2017   Procedure: RIGHT/LEFT HEART CATH AND CORONARY ANGIOGRAPHY;  Surgeon: [name], MD;  Location: Echo CV LAB;  Service: Cardiovascular;  Laterality: N/A;    Current Outpatient Medications  Medication Sig Dispense Refill  . albuterol (VENTOLIN HFA) 108 (90 Base) MCG/ACT inhaler Inhale 2 puffs into the lungs every 6 (six) hours as needed  for wheezing or shortness of breath. 6.7 g 0  . amitriptyline (ELAVIL) 10 MG tablet Take 1 tablet at bed time for one week and then increase 2 tablets at bed time 180 tablet 0  . apixaban (ELIQUIS) 5 MG TABS tablet Take 5 mg by mouth 2 (two) times daily.    . carvedilol (COREG) 6.25 MG tablet Take 1 tablet (6.25 mg total) by mouth 2 (two) times daily with a meal. 180 tablet 3  . donepezil (ARICEPT) 10 MG tablet Take 1 tablet (10 mg total) by mouth at bedtime. 90 tablet 0  . Evolocumab (REPATHA SURECLICK) 242 MG/ML SOAJ Inject 1 pen into the skin every 14 (fourteen) days. 6 pen 3  . ezetimibe (ZETIA) 10 MG tablet Take  1 tablet (10 mg total) by mouth daily. 90 tablet 3  . glucose blood (CONTOUR TEST) test strip 1 each by Other route 2 (two) times a day. 200 each 3  . Insulin Glargine, 2 Unit Dial, (TOUJEO MAX SOLOSTAR) 300 UNIT/ML SOPN Inject 250 Units into the skin every morning. And pen needles 1/day 8 pen 11  . Insulin Syringe-Needle U-100 (INSULIN SYRINGE .3CC/29GX1/2") 29G X 1/2" 0.3 ML MISC Inject 1 Syringe 3 (three) times daily as directed. Check blood sugar three times daily. Dx: E11.9 100 each 3  . lisinopril (ZESTRIL) 10 MG tablet Take 1 tablet (10 mg total) by mouth at bedtime. 90 tablet 3  . polyethylene glycol (MIRALAX / GLYCOLAX) 17 g packet Take 17 g by mouth 2 (two) times daily. 14 each 0  . senna (SENOKOT) 8.6 MG TABS tablet Take 1 tablet (8.6 mg total) by mouth daily as needed for mild constipation. 15 tablet 0  . nitroGLYCERIN (NITROSTAT) 0.4 MG SL tablet Place 1 tablet (0.4 mg total) under the tongue every 5 (five) minutes as needed for chest pain. (Patient not taking: Reported on 08/05/2019) 30 tablet 1   No current facility-administered medications for this visit.    No Known Allergies  Social History   Socioeconomic History  . Marital status: Single    Spouse name: Not on file  . Number of children: 0  . Years of education: Not on file  . Highest education level: Not on file  Occupational History  . Occupation: Disability  Tobacco Use  . Smoking status: Never Smoker  . Smokeless tobacco: Never Used  Substance and Sexual Activity  . Alcohol use: No  . Drug use: No  . Sexual activity: Yes    Birth control/protection: None  Other Topics Concern  . Not on file  Social History Narrative   Social History   Diet?    Do you drink/eat things with caffeine? yes   Marital status?       single      Do you live in a house, apartment, assisted living, condo, trailer, etc.? yes   Is it one or more stories? One story   How many persons live in your home?   Do you have any pets in your  home? (please list)    Highest level of education completed? graduate   Do you exercise?            no                          Type & how often?   Advanced Directives   Do you have a living will? no   Do you have a DNR form?  If not, do you want to discuss one? no   Do you have signed POA/HPOA for forms? no      Functional Status   Do you have difficulty bathing or dressing yourself? no   Do you have difficulty preparing food or eating? no   Do you have difficulty managing your medications? no   Do you have difficulty managing your finances? no   Do you have difficulty affording your medications? no   Social Determinants of Health   Financial Resource Strain:   . Difficulty of Paying Living Expenses: Not on file  Food Insecurity: No Food Insecurity  . Worried About Charity fundraiser in the Last Year: Never true  . Ran Out of Food in the Last Year: Never true  Transportation Needs: Unmet Transportation Needs  . Lack of Transportation (Medical): Yes  . Lack of Transportation (Non-Medical): Yes  Physical Activity:   . Days of Exercise per Week: Not on file  . Minutes of Exercise per Session: Not on file  Stress:   . Feeling of Stress : Not on file  Social Connections:   . Frequency of Communication with Friends and Family: Not on file  . Frequency of Social Gatherings with Friends and Family: Not on file  . Attends Religious Services: Not on file  . Active Member of Clubs or Organizations: Not on file  . Attends Archivist Meetings: Not on file  . Marital Status: Not on file  Intimate Partner Violence:   . Fear of Current or Ex-Partner: Not on file  . Emotionally Abused: Not on file  . Physically Abused: Not on file  . Sexually Abused: Not on file    Family History  Problem Relation Age of Onset  . Heart disease Mother   . Heart attack Mother 45  . Hypertension Mother   . Hyperlipidemia Mother   . Diabetes Mother   . Heart  disease Father   . Heart attack Father 54  . Hypertension Father   . Hyperlipidemia Father   . Diabetes Father   . Heart attack Sister 54  . Colon cancer Neg Hx   . Stomach cancer Neg Hx   . Esophageal cancer Neg Hx   . Rectal cancer Neg Hx     Review of Systems:  As stated in the HPI and otherwise negative.   BP 96/62   Pulse 92   Ht 5' 4"  (1.626 m)   Wt 162 lb (73.5 kg)   SpO2 98%   BMI 27.81 kg/m   Physical Examination:  General: Well developed, well nourished, NAD  HEENT: OP clear, mucus membranes moist  SKIN: warm, dry. No rashes. Neuro: No focal deficits  Musculoskeletal: Muscle strength 5/5 all ext  Psychiatric: Mood and affect normal  Neck: No JVD, no carotid bruits, no thyromegaly, no lymphadenopathy.  Lungs:Clear bilaterally, no wheezes, rhonci, crackles Cardiovascular: Regular rate and rhythm. No murmurs, gallops or rubs. Abdomen:Soft. Bowel sounds present. Non-tender.  Extremities: No lower extremity edema. Pulses are 2 + in the bilateral DP/PT.  Echo July 2020: 1. The left ventricle has moderately reduced systolic function, with an  ejection fraction of 35-40%. The cavity size was mildly dilated. There is  mild concentric left ventricular hypertrophy. Left ventricular diastolic  Doppler parameters are consistent  with pseudonormalization. Elevated left ventricular end-diastolic  pressure.  2. The right ventricle has normal systolic function. The cavity was  normal. There is no increase in right ventricular wall thickness.  3. Left  atrial size was mildly dilated.  4. Mild thickening of the aortic valve. Mild calcification of the aortic  valve. Aortic valve regurgitation is trivial by color flow Doppler. No  stenosis of the aortic valve.  5. The aorta is normal in size and structure.   EKG:  EKG is not ordered today. The ekg ordered today demonstrates    Recent Labs: 06/17/2019: TSH 2.428 06/29/2019: ALT 23; B Natriuretic Peptide 44.1; Magnesium  2.0 07/13/2019: BUN 10; Creatinine, Ser 1.17; Hemoglobin 14.5; Platelets 162; Potassium 4.1; Sodium 141   Lipid Panel    Component Value Date/Time   CHOL 152 04/08/2019 0801   TRIG 70 04/08/2019 0801   HDL 57 04/08/2019 0801   CHOLHDL 2.7 04/08/2019 0801   CHOLHDL 3.8 03/09/2018 1500   VLDL 10 03/09/2018 1500   LDLCALC 81 04/08/2019 0801   LDLCALC 135 (H) 09/22/2017 0954     Wt Readings from Last 3 Encounters:  08/05/19 162 lb (73.5 kg)  08/01/19 159 lb (72.1 kg)  07/25/19 164 lb (74.4 kg)     Other studies Reviewed: Additional studies/ records that were reviewed today include:  Review of the above records demonstrates:    Assessment and Plan:   1. CAD without angina: His chest pain is atypical. He has mild CAD by cath in 2019. Continue ASA, statin and beta blocker.   2. Non-ischemic Cardiomyopathy: LVEF=35-40% by echo in July 2020. He is on Coreg and Lisinopril. He did not tolerate Entresto and refused to start aldactone.   3. HTN: BP controlled  4. HLD: continue statin  5. Dyspnea/cough: He has ongoing cough, fatigue and dyspnea following his recent hospitalization for Covid pneumonia. Today he has good O2 sats and clear lungs on exam. I do not think he current symptoms are cardiac related. EKG unchanged with sinus rhythm. Atypical chest pain, worsened with palpation. Weight is stable. No evidence of volume overload. I suspect that his ongoing fatigue is related to his recent Covid infection. He will call primary care today to discuss. He has no clinical indication today for inpatient evaluation.   6. PAF: Sinus today. Continue beta blocker and Eliquis.   Current medicines are reviewed at length with the patient today.  The patient does not have concerns regarding medicines.  The following changes have been made:  no change  Labs/ tests ordered today include:   Orders Placed This Encounter  Procedures  . EKG 12-Lead     Disposition:   F/U with me or office APP in  6 months.    Signed, [name], MD 08/05/2019 3:50 PM    [facility] [place], [place], [place]  [address] Phone: [phone]; Fax: [phone]

## 2019-08-06 ENCOUNTER — Other Ambulatory Visit: Payer: Self-pay | Admitting: Pharmacist

## 2019-08-06 ENCOUNTER — Ambulatory Visit: Payer: Self-pay | Admitting: Pharmacist

## 2019-08-06 NOTE — Patient Outreach (Signed)
[facility]) Care Management  [place] 08/06/2019  Talley Kreiser [birth year] [id number]  Reason for call: f/u on medication mgmt / diabetes / patient assistance  Successful call with Mr. Iovino.  Patient reports "I'm doing great." He states he had o/v yesterday with cardiology that went well but he needs to call PCP for f/u per instructions.    Patient reports he has all medications right now that he needs.  Several medications arrived from mail order pharmacy and EMT has filled up his pillbox.  Upon further questioning, he does not have Repatha and Eliquis and thinks his insulin will run out next week.  Unclear when patient will go into the coverage gap with medications but due to high insulin requirements, may be very soon.    He is not checking blood sugars regularly and states last CBG from 2 days ago was 254.  He states it is up and down but cannot recall other readings.  He reports he is still using 250 units of Toujeo daily. Insulin administration reviewed last week with EMT.  Patient denies any low CBGs or signs / symptoms of hypoglycemia.   Patient reports he returned patient assistance program application in the mail last week.  It has not yet been received at [facility].    Call placed to [place] to request refills.   Per [employer], unable to calculate cost of Toujeo as it is out-of-stock and also too early to fill.  Will need to call back later this week for refills.  Per initial rejection, claim co-pay is $338 / 28 day supply, however per [place] included in insulin savings program to cost $35 / month even through coverage gap.  Will need to f/u further with [facility].   Plan: -Will f/u with [employer] and refills with [facility] later this week  Ralene Bathe, PharmD, [place] [phone]

## 2019-08-08 ENCOUNTER — Ambulatory Visit: Payer: Self-pay | Admitting: Pharmacist

## 2019-08-08 ENCOUNTER — Other Ambulatory Visit (HOSPITAL_COMMUNITY): Payer: Self-pay

## 2019-08-08 ENCOUNTER — Ambulatory Visit: Payer: Medicare HMO | Admitting: Physician Assistant

## 2019-08-08 NOTE — Progress Notes (Signed)
Paramedicine Encounter    Patient ID: Calvin [name], male    DOB: 1961/06/16, 59 y.o.   MRN: [id number]   [name], NP as PCP - General (Geriatric Medicine) [name], MD as PCP - Cardiology (Cardiology) [name], [name], MD as PCP - Advanced Heart Failure (Cardiology) [name], MD as Consulting Physician (Endocrinology) Rudean Haskell, [facility] as [place] Management (Pharmacist) [name], RN as [place] Management Adaline Sill, CPhT as [employer] banker, corporate)  Patient Active Problem List   Diagnosis Date Noted  . COVID-19 virus detected 06/18/2019  . Right sided weakness 06/17/2019  . AKI (acute kidney injury) ([place]) 06/17/2019  . Foot pain, bilateral 02/01/2019  . Abdominal pain 01/23/2019  . Chest pain 01/23/2019  . Right leg pain 12/20/2018  . Overgrown toenails 12/20/2018  . Unsteady gait 12/20/2018  . Encounter for therapeutic drug monitoring 10/22/2018  . Nausea and vomiting 03/10/2018  . AF (paroxysmal atrial fibrillation) ([place]) 03/10/2018  . Non-cardiac chest pain 03/09/2018  . Diabetic neuropathy ([place]) 09/26/2017  . MCI (mild cognitive impairment) 06/23/2017  . OSA on CPAP 05/10/2017  . Gastroesophageal reflux disease 05/10/2017  . Insomnia 05/10/2017  . Onychomycosis of multiple toenails with type 2 diabetes mellitus and peripheral neuropathy ([place]) 05/10/2017  . Chronic systolic heart failure ([place]) 11/22/2015  . Essential hypertension 11/22/2015  . DM (diabetes mellitus) ([place]) 11/20/2015  . HLD (hyperlipidemia) 11/20/2015  . Hx of adenomatous colonic polyps 05/19/2011    Current Outpatient Medications:  .  albuterol (VENTOLIN HFA) 108 (90 Base) MCG/ACT inhaler, Inhale 2 puffs into the lungs every 6 (six) hours as needed for wheezing or shortness of breath., Disp: 6.7 g, Rfl: 0 .  amitriptyline (ELAVIL) 10 MG tablet, Take  1 tablet at bed time for one week and then increase 2 tablets at bed time, Disp: 180 tablet, Rfl: 0 .  apixaban (ELIQUIS) 5 MG TABS tablet, Take 5 mg by mouth 2 (two) times daily., Disp: , Rfl:  .  carvedilol (COREG) 6.25 MG tablet, Take 1 tablet (6.25 mg total) by mouth 2 (two) times daily with a meal., Disp: 180 tablet, Rfl: 3 .  donepezil (ARICEPT) 10 MG tablet, Take 1 tablet (10 mg total) by mouth at bedtime., Disp: 90 tablet, Rfl: 0 .  ezetimibe (ZETIA) 10 MG tablet, Take 1 tablet (10 mg total) by mouth daily., Disp: 90 tablet, Rfl: 3 .  glucose blood (CONTOUR TEST) test strip, 1 each by Other route 2 (two) times a day., Disp: 200 each, Rfl: 3 .  Insulin Glargine, 2 Unit Dial, (TOUJEO MAX SOLOSTAR) 300 UNIT/ML SOPN, Inject 250 Units into the skin every morning. And pen needles 1/day, Disp: 8 pen, Rfl: 11 .  Insulin Syringe-Needle U-100 (INSULIN SYRINGE .3CC/29GX1/2") 29G X 1/2" 0.3 ML MISC, Inject 1 Syringe 3 (three) times daily as directed. Check blood sugar three times daily. Dx: E11.9, Disp: 100 each, Rfl: 3 .  lisinopril (ZESTRIL) 10 MG tablet, Take 1 tablet (10 mg total) by mouth at bedtime., Disp: 90 tablet, Rfl: 3 .  polyethylene glycol (MIRALAX / GLYCOLAX) 17 g packet, Take 17 g by mouth 2 (two) times daily., Disp: 14 each, Rfl: 0 .  senna (SENOKOT) 8.6 MG TABS tablet, Take 1 tablet (8.6 mg total) by mouth daily as needed for mild constipation., Disp: 15 tablet, Rfl: 0 .  Evolocumab (REPATHA SURECLICK) 263 MG/ML SOAJ, Inject 1 pen into the skin  every 14 (fourteen) days. (Patient not taking: Reported on 08/08/2019), Disp: 6 pen, Rfl: 3 .  nitroGLYCERIN (NITROSTAT) 0.4 MG SL tablet, Place 1 tablet (0.4 mg total) under the tongue every 5 (five) minutes as needed for chest pain. (Patient not taking: Reported on 08/05/2019), Disp: 30 tablet, Rfl: 1 No Known Allergies    Social History   Socioeconomic History  . Marital status: Single    Spouse name: Not on file  . Number of children: 0  .  Years of education: Not on file  . Highest education level: Not on file  Occupational History  . Occupation: Disability  Tobacco Use  . Smoking status: Never Smoker  . Smokeless tobacco: Never Used  Substance and Sexual Activity  . Alcohol use: No  . Drug use: No  . Sexual activity: Yes    Birth control/protection: None  Other Topics Concern  . Not on file  Social History Narrative   Social History   Diet?    Do you drink/eat things with caffeine? yes   Marital status?       single      Do you live in a house, apartment, assisted living, condo, trailer, etc.? yes   Is it one or more stories? One story   How many persons live in your home?   Do you have any pets in your home? (please list)    Highest level of education completed? graduate   Do you exercise?            no                          Type & how often?   Advanced Directives   Do you have a living will? no   Do you have a DNR form?                                  If not, do you want to discuss one? no   Do you have signed POA/HPOA for forms? no      Functional Status   Do you have difficulty bathing or dressing yourself? no   Do you have difficulty preparing food or eating? no   Do you have difficulty managing your medications? no   Do you have difficulty managing your finances? no   Do you have difficulty affording your medications? no   Social Determinants of Health   Financial Resource Strain:   . Difficulty of Paying Living Expenses: Not on file  Food Insecurity: No Food Insecurity  . Worried About Charity fundraiser in the Last Year: Never true  . Ran Out of Food in the Last Year: Never true  Transportation Needs: Unmet Transportation Needs  . Lack of Transportation (Medical): Yes  . Lack of Transportation (Non-Medical): Yes  Physical Activity:   . Days of Exercise per Week: Not on file  . Minutes of Exercise per Session: Not on file  Stress:   . Feeling of Stress : Not on file  Social Connections:    . Frequency of Communication with Friends and Family: Not on file  . Frequency of Social Gatherings with Friends and Family: Not on file  . Attends Religious Services: Not on file  . Active Member of Clubs or Organizations: Not on file  . Attends Archivist Meetings: Not on file  . Marital Status: Not on  file  Intimate Partner Violence:   . Fear of Current or Ex-Partner: Not on file  . Emotionally Abused: Not on file  . Physically Abused: Not on file  . Sexually Abused: Not on file    Physical Exam      Future Appointments  Date Time Provider [place]  08/08/2019  2:30 PM Rudean Haskell, [facility] THN-COM None  08/20/2019 12:00 PM [facility] PA/NP MC-HVSC None  08/23/2019  2:00 PM Clydell Hakim, RD [place] NDM  08/26/2019  9:30 AM [name], RN THN-COM None  08/27/2019  4:00 PM Cameron Sprang, MD LBN-LBNG None  08/28/2019  9:00 AM [name], NP PSC-PSC None    BP 130/70   Pulse 90   Temp 97.7 F (36.5 C)   Resp 18   Wt 157 lb (71.2 kg)   SpO2 98%   BMI 26.95 kg/m  CBG PTA-220 Weight yesterday-157 Last visit weight-159   Pt reports feeling ok, he made f/u with cardio the other day b/c he had been having c/p, coughing and increased tiredness. That comes and goes. Doc seemed to think it was due to ongoing issues post covid. Lungs clear but decreased some to lower bases.  He states his CBG's running between 160s-200s.  No missed doses of his meds.  meds verified and pill box refilled.  He no longer has the repatha. He said he was taken off of it until he goes back to see the prescribing doc.  He has OTC benefit of $40 through [email or web address]  His HHRN came during visit today. This is her last visit.    Calvin [name], [place] [facility] Paramedic  08/08/19

## 2019-08-09 ENCOUNTER — Ambulatory Visit: Payer: Self-pay | Admitting: Pharmacist

## 2019-08-12 ENCOUNTER — Other Ambulatory Visit: Payer: Self-pay | Admitting: Pharmacist

## 2019-08-12 ENCOUNTER — Ambulatory Visit: Payer: Self-pay | Admitting: Pharmacist

## 2019-08-12 NOTE — Patient Outreach (Signed)
[facility]) Care Management  [place] 08/12/2019  Calvin [name] [birth year] [id number]  Incoming call from EMT Marylouise Stacks regarding Calvin [name].  She will see him on Thursday this week to fill pillbox.  She reports patient has Eliquis at home and has not run out of this medication.    Outgoing call to [place].  Per pharmacy team member, patient is already in the coverage gap.  He is listed as having Partial LIS therefore does not qualify for the insulin savings model even though Partial LIS does not reduce co-pay.     Email sent to [facility] pharmacist to inquire further on medication options for Toujeo and insulin savings plan.    Call placed to [place] technician to see if patient assistance program paperwork has been received yet.    Plan: Will await feedback re: PAP from [facility] and [place], PharmD, [place] [phone]

## 2019-08-13 ENCOUNTER — Ambulatory Visit: Payer: Self-pay | Admitting: Pharmacist

## 2019-08-15 ENCOUNTER — Other Ambulatory Visit (HOSPITAL_COMMUNITY): Payer: Self-pay

## 2019-08-15 ENCOUNTER — Other Ambulatory Visit: Payer: Self-pay | Admitting: Pharmacist

## 2019-08-15 ENCOUNTER — Ambulatory Visit: Payer: Self-pay | Admitting: Pharmacist

## 2019-08-15 NOTE — Patient Outreach (Signed)
[facility]) Care Management  [place] 08/15/2019  Rorik Vespa Jun 13, 1961 [id number]  Successful call with patient.   Patient reports he has not received letter back from Social Security regarding Extra Help yet.  Application submitted on 07/25/2019 so letter should arrive in the next 1-2 weeks.  Patient aware to look out for this letter.   Patient reports he still has copy of proof of income.  We discussed that his portion of application has not yet been returned to [facility].  Katie EMT will bring him another copy when she sees him next week.  [facility] pharmacy technician will email Katie copy of application.    Insulin:  1) Still waiting to hear back from [facility] regarding insulin savings model .  2) per discussion with [facility] pharmacy technician, since patient has partial Extra Help, he is not eligible for [employer] or [employer] patient assistance programs.  Only insulin program is [employer] which does not have concentrated insulin.  Basaglar is the only long-acting insulin from [employer] or Humalog U-500.  Will have patient complete application and will contact provider to see if either of these will be option for patient.    3) would benefit from [employer] / nutrition counseling on a regular basis - will contact [facility] RN to assist with reinforcing this knowledge  Ralene Bathe, PharmD, [place] [phone]

## 2019-08-15 NOTE — Progress Notes (Signed)
Paramedicine Encounter    Patient ID: Calvin [name], male    DOB: [birth year], 60 y.o.   MRN: [id number]   [name], NP as PCP - General (Geriatric Medicine) [name], MD as PCP - Cardiology (Cardiology) [name], [name], MD as PCP - Advanced Heart Failure (Cardiology) [name], MD as Consulting Physician (Endocrinology) Rudean Haskell, [facility] as [place] Management (Pharmacist) [name], RN as [place] Management Adaline Sill, CPhT as [employer] banker, corporate)  Patient Active Problem List   Diagnosis Date Noted  . COVID-19 virus detected 06/18/2019  . Right sided weakness 06/17/2019  . AKI (acute kidney injury) ([place]) 06/17/2019  . Foot pain, bilateral 02/01/2019  . Abdominal pain 01/23/2019  . Chest pain 01/23/2019  . Right leg pain 12/20/2018  . Overgrown toenails 12/20/2018  . Unsteady gait 12/20/2018  . Encounter for therapeutic drug monitoring 10/22/2018  . Nausea and vomiting 03/10/2018  . AF (paroxysmal atrial fibrillation) ([place]) 03/10/2018  . Non-cardiac chest pain 03/09/2018  . Diabetic neuropathy ([place]) 09/26/2017  . MCI (mild cognitive impairment) 06/23/2017  . OSA on CPAP 05/10/2017  . Gastroesophageal reflux disease 05/10/2017  . Insomnia 05/10/2017  . Onychomycosis of multiple toenails with type 2 diabetes mellitus and peripheral neuropathy ([place]) 05/10/2017  . Chronic systolic heart failure ([place]) 11/22/2015  . Essential hypertension 11/22/2015  . DM (diabetes mellitus) ([place]) 11/20/2015  . HLD (hyperlipidemia) 11/20/2015  . Hx of adenomatous colonic polyps 05/19/2011    Current Outpatient Medications:  .  albuterol (VENTOLIN HFA) 108 (90 Base) MCG/ACT inhaler, Inhale 2 puffs into the lungs every 6 (six) hours as needed for wheezing or shortness of breath., Disp: 6.7 g, Rfl: 0 .  amitriptyline (ELAVIL) 10 MG tablet, Take  1 tablet at bed time for one week and then increase 2 tablets at bed time, Disp: 180 tablet, Rfl: 0 .  apixaban (ELIQUIS) 5 MG TABS tablet, Take 5 mg by mouth 2 (two) times daily., Disp: , Rfl:  .  carvedilol (COREG) 6.25 MG tablet, Take 1 tablet (6.25 mg total) by mouth 2 (two) times daily with a meal., Disp: 180 tablet, Rfl: 3 .  donepezil (ARICEPT) 10 MG tablet, Take 1 tablet (10 mg total) by mouth at bedtime., Disp: 90 tablet, Rfl: 0 .  ezetimibe (ZETIA) 10 MG tablet, Take 1 tablet (10 mg total) by mouth daily., Disp: 90 tablet, Rfl: 3 .  glucose blood (CONTOUR TEST) test strip, 1 each by Other route 2 (two) times a day., Disp: 200 each, Rfl: 3 .  Insulin Glargine, 2 Unit Dial, (TOUJEO MAX SOLOSTAR) 300 UNIT/ML SOPN, Inject 250 Units into the skin every morning. And pen needles 1/day, Disp: 8 pen, Rfl: 11 .  Insulin Syringe-Needle U-100 (INSULIN SYRINGE .3CC/29GX1/2") 29G X 1/2" 0.3 ML MISC, Inject 1 Syringe 3 (three) times daily as directed. Check blood sugar three times daily. Dx: E11.9, Disp: 100 each, Rfl: 3 .  lisinopril (ZESTRIL) 10 MG tablet, Take 1 tablet (10 mg total) by mouth at bedtime., Disp: 90 tablet, Rfl: 3 .  polyethylene glycol (MIRALAX / GLYCOLAX) 17 g packet, Take 17 g by mouth 2 (two) times daily., Disp: 14 each, Rfl: 0 .  senna (SENOKOT) 8.6 MG TABS tablet, Take 1 tablet (8.6 mg total) by mouth daily as needed for mild constipation., Disp: 15 tablet, Rfl: 0 .  Evolocumab (REPATHA SURECLICK) 952 MG/ML SOAJ, Inject 1 pen into the skin  every 14 (fourteen) days. (Patient not taking: Reported on 08/08/2019), Disp: 6 pen, Rfl: 3 .  nitroGLYCERIN (NITROSTAT) 0.4 MG SL tablet, Place 1 tablet (0.4 mg total) under the tongue every 5 (five) minutes as needed for chest pain. (Patient not taking: Reported on 08/05/2019), Disp: 30 tablet, Rfl: 1 No Known Allergies    Social History   Socioeconomic History  . Marital status: Single    Spouse name: Not on file  . Number of children: 0  .  Years of education: Not on file  . Highest education level: Not on file  Occupational History  . Occupation: Disability  Tobacco Use  . Smoking status: Never Smoker  . Smokeless tobacco: Never Used  Substance and Sexual Activity  . Alcohol use: No  . Drug use: No  . Sexual activity: Yes    Birth control/protection: None  Other Topics Concern  . Not on file  Social History Narrative   Social History   Diet?    Do you drink/eat things with caffeine? yes   Marital status?       single      Do you live in a house, apartment, assisted living, condo, trailer, etc.? yes   Is it one or more stories? One story   How many persons live in your home?   Do you have any pets in your home? (please list)    Highest level of education completed? graduate   Do you exercise?            no                          Type & how often?   Advanced Directives   Do you have a living will? no   Do you have a DNR form?                                  If not, do you want to discuss one? no   Do you have signed POA/HPOA for forms? no      Functional Status   Do you have difficulty bathing or dressing yourself? no   Do you have difficulty preparing food or eating? no   Do you have difficulty managing your medications? no   Do you have difficulty managing your finances? no   Do you have difficulty affording your medications? no   Social Determinants of Health   Financial Resource Strain:   . Difficulty of Paying Living Expenses: Not on file  Food Insecurity: No Food Insecurity  . Worried About Charity fundraiser in the Last Year: Never true  . Ran Out of Food in the Last Year: Never true  Transportation Needs: Unmet Transportation Needs  . Lack of Transportation (Medical): Yes  . Lack of Transportation (Non-Medical): Yes  Physical Activity:   . Days of Exercise per Week: Not on file  . Minutes of Exercise per Session: Not on file  Stress:   . Feeling of Stress : Not on file  Social Connections:    . Frequency of Communication with Friends and Family: Not on file  . Frequency of Social Gatherings with Friends and Family: Not on file  . Attends Religious Services: Not on file  . Active Member of Clubs or Organizations: Not on file  . Attends Archivist Meetings: Not on file  . Marital Status: Not on  file  Intimate Partner Violence:   . Fear of Current or Ex-Partner: Not on file  . Emotionally Abused: Not on file  . Physically Abused: Not on file  . Sexually Abused: Not on file    Physical Exam      Future Appointments  Date Time Provider [place]  08/15/2019  3:00 PM Rudean Haskell, RPH [facility]COM None  08/20/2019 12:00 PM [facility] PA/NP [facility] None  08/23/2019  2:00 PM Clydell Hakim, RD [place] NDM  08/26/2019  9:30 AM [name], RN [facility]COM None  08/27/2019  4:00 PM Cameron Sprang, MD LBN-LBNG None  08/28/2019  9:00 AM [name], NP PSC-PSC None    BP 138/72   Pulse 88   Temp 97.7 F (36.5 C)   Resp 16   Wt 157 lb (71.2 kg)   SpO2 99%   BMI 26.95 kg/m  CBG PTA-220 CBG EMS-252 Weight yesterday-157 Last visit weight-157  Pt reports he is feeling good, he is feeling better.  No c/p, he still gets h/a and dizziness at times.  Breathing is doing the same--lingering effects from COVID.  If he speaks a lot then he gets sob and starts coughing.  CBG's still running consistently above 200.  He has enough toujeo to last for 3 more weeks. Jaclyn Shaggy is trying to work on solution for that problem.  No missed doses of meds.  meds verified and pill box refilled.  He had pancakes and lite syrup for breakfast. Will see if he is able to talk with nutriotionist for better menu planning.  Will f/u next week.   Marylouise Stacks, [place] [facility] Paramedic  08/15/19

## 2019-08-20 ENCOUNTER — Encounter (HOSPITAL_COMMUNITY): Payer: Self-pay

## 2019-08-20 ENCOUNTER — Other Ambulatory Visit: Payer: Self-pay

## 2019-08-20 ENCOUNTER — Ambulatory Visit (HOSPITAL_COMMUNITY)
Admission: RE | Admit: 2019-08-20 | Discharge: 2019-08-20 | Disposition: A | Discharge status: Home / Self Care | Payer: Medicare Other | Source: Ambulatory Visit | Attending: Cardiology | Admitting: Cardiology

## 2019-08-20 ENCOUNTER — Other Ambulatory Visit (HOSPITAL_COMMUNITY): Payer: Self-pay

## 2019-08-20 VITALS — BP 136/74 | HR 93 | Wt 162.0 lb

## 2019-08-20 DIAGNOSIS — I252 Old myocardial infarction: Secondary | ICD-10-CM | POA: Insufficient documentation

## 2019-08-20 DIAGNOSIS — Z86010 Personal history of colonic polyps: Secondary | ICD-10-CM | POA: Insufficient documentation

## 2019-08-20 DIAGNOSIS — K219 Gastro-esophageal reflux disease without esophagitis: Secondary | ICD-10-CM | POA: Insufficient documentation

## 2019-08-20 DIAGNOSIS — Z794 Long term (current) use of insulin: Secondary | ICD-10-CM | POA: Diagnosis not present

## 2019-08-20 DIAGNOSIS — Z8616 Personal history of COVID-19: Secondary | ICD-10-CM | POA: Insufficient documentation

## 2019-08-20 DIAGNOSIS — Z79899 Other long term (current) drug therapy: Secondary | ICD-10-CM | POA: Diagnosis not present

## 2019-08-20 DIAGNOSIS — Z833 Family history of diabetes mellitus: Secondary | ICD-10-CM | POA: Insufficient documentation

## 2019-08-20 DIAGNOSIS — I5042 Chronic combined systolic (congestive) and diastolic (congestive) heart failure: Secondary | ICD-10-CM | POA: Diagnosis present

## 2019-08-20 DIAGNOSIS — E119 Type 2 diabetes mellitus without complications: Secondary | ICD-10-CM | POA: Insufficient documentation

## 2019-08-20 DIAGNOSIS — I428 Other cardiomyopathies: Secondary | ICD-10-CM | POA: Diagnosis not present

## 2019-08-20 DIAGNOSIS — R413 Other amnesia: Secondary | ICD-10-CM | POA: Insufficient documentation

## 2019-08-20 DIAGNOSIS — I110 Hypertensive heart disease with heart failure: Secondary | ICD-10-CM | POA: Diagnosis not present

## 2019-08-20 DIAGNOSIS — Z7901 Long term (current) use of anticoagulants: Secondary | ICD-10-CM | POA: Diagnosis not present

## 2019-08-20 DIAGNOSIS — G4730 Sleep apnea, unspecified: Secondary | ICD-10-CM | POA: Insufficient documentation

## 2019-08-20 DIAGNOSIS — I5022 Chronic systolic (congestive) heart failure: Secondary | ICD-10-CM | POA: Diagnosis not present

## 2019-08-20 DIAGNOSIS — I2510 Atherosclerotic heart disease of native coronary artery without angina pectoris: Secondary | ICD-10-CM | POA: Insufficient documentation

## 2019-08-20 DIAGNOSIS — I480 Paroxysmal atrial fibrillation: Secondary | ICD-10-CM | POA: Diagnosis not present

## 2019-08-20 DIAGNOSIS — Z8249 Family history of ischemic heart disease and other diseases of the circulatory system: Secondary | ICD-10-CM | POA: Diagnosis not present

## 2019-08-20 DIAGNOSIS — Z8349 Family history of other endocrine, nutritional and metabolic diseases: Secondary | ICD-10-CM | POA: Insufficient documentation

## 2019-08-20 DIAGNOSIS — E785 Hyperlipidemia, unspecified: Secondary | ICD-10-CM | POA: Insufficient documentation

## 2019-08-20 LAB — BASIC METABOLIC PANEL
Anion gap: 9 (ref 5–15)
BUN: 13 mg/dL (ref 6–20)
CO2: 25 mmol/L (ref 22–32)
Calcium: 9.2 mg/dL (ref 8.9–10.3)
Chloride: 106 mmol/L (ref 98–111)
Creatinine, Ser: 1.08 mg/dL (ref 0.61–1.24)
GFR calc Af Amer: >60 mL/min (ref >60)
GFR calc non Af Amer: >60 mL/min (ref >60)
Glucose, Bld: 162 mg/dL — ABNORMAL HIGH (ref 70–99)
Potassium: 4.1 mmol/L (ref 3.5–5.1)
Sodium: 140 mmol/L (ref 135–145)

## 2019-08-20 MED ORDER — SPIRONOLACTONE 25 MG PO TABS: 12.5000 mg | ORAL_TABLET | Freq: Every day | ORAL | 3 refills | Status: DC

## 2019-08-20 MED ORDER — DIGOXIN 125 MCG PO TABS: 0.1250 mg | ORAL_TABLET | Freq: Every day | ORAL | 3 refills | Status: DC

## 2019-08-20 NOTE — Progress Notes (Signed)
ADVANCED [facility] CLINIC  Referring Physician: Primary Care: Dr [name]  Primary Cardiologist: Dr Angelena Form  Neurology: Dr Delice Lesch  [facility]: Dr. Haroldine Laws   HPI:  Calvin [name] is 59 year old with a history of chronic systolic heart failure due to NICM with EF 35-40%, uncontrolled DMII, GERD, HTN, MI 2015, memory issues, and hyperlipidemia. Of note he was homeless for 6 years but was able to get housing in February of last year.   Admitted with 08/2017 with chest pain. 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  Readmitted in 10/2017 with chest pain. CTA was negative for PE. He was also admitted in 03/2018 with chest pain. He had a brief episode of A fib and was started on eliquis and carvedilol was increased to 6.25 mg twice a day.  This was not thought to be ACS.   In the past he was on Entresto but this was later stopped due to dizziness. Placed back on lisinopril.   Had sleep study 07/2018 that was normal.   Had repeat 2D Echo 01/2019 that showed EF 35-40%, mild LVH, normal RV size and function. No significant valvular dysfunction.   06/2019, he was admitted to [facility] for Covid 19 infection. Was placed on BiPAP but did not require intubation. Was there for about 3 weeks.   Since Covid, he has continued w/ persistent fatigue and persistent dyspnea. Has no energy. No CP, palpitations, LEE, orthopnea/ PND. He is euvolemic by exam and by ReDs clip measurement at 32%. He is followed closely by Paramedicine and med compliance is good. BP 136/74. No orthostatic symptoms.   Recently seen by Dr. Angelena Form 2/1. Had EKG that showed NSR. No afib. RRR on exam today. He remains on Eliquis for PAF. Recent CBC this month showed stable hgb at 14.      ECHO 12/2017  EF 35-40% Grade II DD. Moderate global reduction in LV systolic function; moderate   diastolic dysfunction. ECHO 09/2017  EF 20-25% Grade IDD  LHC/RHC 08/2017  RA 5  PA 22/11 (15)  PCWP 9  CO 6 CI 3/14   Prox  RCA to Mid RCA lesion is 30% stenosed.  Mid RCA to Dist RCA lesion is 30% stenosed.  Prox Cx to Dist Cx lesion is 20% stenosed.  Mid LAD lesion is 30% stenosed.  There is mild to moderate left ventricular systolic dysfunction.  LV end diastolic pressure is mildly elevated.  The left ventricular ejection fraction is 35-45% by visual estimate.  There is no mitral valve regurgitation.    SH: Disabled for 6 years due to diabetes. He does not smoke. Denies cocaine abuse.  Past Medical History:  Diagnosis Date  . Adenoidal hypertrophy   . Adenomatous colon polyps 2012  . Arthritis   . Diabetes mellitus   . GERD (gastroesophageal reflux disease)   . Heart attack ([place])    While living in [place]  [name] Hx of adenomatous colonic polyps 08/18/2017  . Hyperlipidemia   . Hypertension   . Mild CAD    a. cath in 08/2017 showing mild nonobstructive CAD with scattered 20-30% stenosis.   . Nonischemic cardiomyopathy ([place])    a. EF 20-25% by echo in 09/2017 with cath showing mild CAD. b.  Last echo 12/2017 EF 35-40%, grade 2 DD.  [name] Obesity   . PAF (paroxysmal atrial fibrillation) ([place])   . Sleep apnea    cpap, pt says no longer has    Current Outpatient Medications  Medication Sig Dispense Refill  . albuterol (VENTOLIN HFA) 108 (90 Base) MCG/ACT inhaler Inhale 2 puffs into the lungs every 6 (six) hours as needed for wheezing or shortness of breath. 6.7 g 0  . amitriptyline (ELAVIL) 10 MG tablet Take 1 tablet at bed time for one week and then increase 2 tablets at bed time 180 tablet 0  . apixaban (ELIQUIS) 5 MG TABS tablet Take 5 mg by mouth 2 (two) times daily.    . carvedilol (COREG) 6.25 MG tablet Take 1 tablet (6.25 mg total) by mouth 2 (two) times daily with a meal. 180 tablet 3  . donepezil (ARICEPT) 10 MG tablet Take 1 tablet (10 mg total) by mouth at bedtime. 90 tablet 0  . ezetimibe (ZETIA) 10 MG tablet Take 1 tablet (10 mg total) by mouth daily. 90 tablet 3  . glucose blood (CONTOUR  TEST) test strip 1 each by Other route 2 (two) times a day. 200 each 3  . Insulin Glargine, 2 Unit Dial, (TOUJEO MAX SOLOSTAR) 300 UNIT/ML SOPN Inject 250 Units into the skin every morning. And pen needles 1/day 8 pen 11  . Insulin Syringe-Needle U-100 (INSULIN SYRINGE .3CC/29GX1/2") 29G X 1/2" 0.3 ML MISC Inject 1 Syringe 3 (three) times daily as directed. Check blood sugar three times daily. Dx: E11.9 100 each 3  . lisinopril (ZESTRIL) 10 MG tablet Take 1 tablet (10 mg total) by mouth at bedtime. 90 tablet 3  . nitroGLYCERIN (NITROSTAT) 0.4 MG SL tablet Place 1 tablet (0.4 mg total) under the tongue every 5 (five) minutes as needed for chest pain. 30 tablet 1  . polyethylene glycol (MIRALAX / GLYCOLAX) 17 g packet Take 17 g by mouth 2 (two) times daily. 14 each 0  . senna (SENOKOT) 8.6 MG TABS tablet Take 1 tablet (8.6 mg total) by mouth daily as needed for mild constipation. 15 tablet 0  . Evolocumab (REPATHA SURECLICK) 496 MG/ML SOAJ Inject 1 pen into the skin every 14 (fourteen) days. (Patient not taking: Reported on 08/08/2019) 6 pen 3   No current facility-administered medications for this encounter.    No Known Allergies    Social History   Socioeconomic History  . Marital status: Single    Spouse name: Not on file  . Number of children: 0  . Years of education: Not on file  . Highest education level: Not on file  Occupational History  . Occupation: Disability  Tobacco Use  . Smoking status: Never Smoker  . Smokeless tobacco: Never Used  Substance and Sexual Activity  . Alcohol use: No  . Drug use: No  . Sexual activity: Yes    Birth control/protection: None  Other Topics Concern  . Not on file  Social History Narrative   Social History   Diet?    Do you drink/eat things with caffeine? yes   Marital status?       single      Do you live in a house, apartment, assisted living, condo, trailer, etc.? yes   Is it one or more stories? One story   How many persons live in  your home?   Do you have any pets in your home? (please list)    Highest level of education completed? graduate   Do you exercise?            no  Type & how often?   Advanced Directives   Do you have a living will? no   Do you have a DNR form?                                  If not, do you want to discuss one? no   Do you have signed POA/HPOA for forms? no      Functional Status   Do you have difficulty bathing or dressing yourself? no   Do you have difficulty preparing food or eating? no   Do you have difficulty managing your medications? no   Do you have difficulty managing your finances? no   Do you have difficulty affording your medications? no   Social Determinants of Health   Financial Resource Strain:   . Difficulty of Paying Living Expenses: Not on file  Food Insecurity: No Food Insecurity  . Worried About Charity fundraiser in the Last Year: Never true  . Ran Out of Food in the Last Year: Never true  Transportation Needs: Unmet Transportation Needs  . Lack of Transportation (Medical): Yes  . Lack of Transportation (Non-Medical): Yes  Physical Activity:   . Days of Exercise per Week: Not on file  . Minutes of Exercise per Session: Not on file  Stress:   . Feeling of Stress : Not on file  Social Connections:   . Frequency of Communication with Friends and Family: Not on file  . Frequency of Social Gatherings with Friends and Family: Not on file  . Attends Religious Services: Not on file  . Active Member of Clubs or Organizations: Not on file  . Attends Archivist Meetings: Not on file  . Marital Status: Not on file  Intimate Partner Violence:   . Fear of Current or Ex-Partner: Not on file  . Emotionally Abused: Not on file  . Physically Abused: Not on file  . Sexually Abused: Not on file      Family History  Problem Relation Age of Onset  . Heart disease Mother   . Heart attack Mother 39  . Hypertension Mother   .  Hyperlipidemia Mother   . Diabetes Mother   . Heart disease Father   . Heart attack Father 31  . Hypertension Father   . Hyperlipidemia Father   . Diabetes Father   . Heart attack Sister 15  . Colon cancer Neg Hx   . Stomach cancer Neg Hx   . Esophageal cancer Neg Hx   . Rectal cancer Neg Hx     Vitals:   08/20/19 1211  BP: 136/74  Pulse: 93  SpO2: 99%  Weight: 73.5 kg (162 lb)   Wt Readings from Last 3 Encounters:  08/20/19 73.5 kg (162 lb)  08/15/19 71.2 kg (157 lb)  08/08/19 71.2 kg (157 lb)     PHYSICAL EXAM: ReDs Clip 32% General:  Fatigue appearing. No respiratory difficulty HEENT: normal Neck: supple. no JVD. Carotids 2+ bilat; no bruits. No lymphadenopathy or thyromegaly appreciated. Cor: PMI nondisplaced. Regular rate & rhythm. No rubs, gallops or murmurs. Lungs: clear Abdomen: soft, nontender, nondistended. No hepatosplenomegaly. No bruits or masses. Good bowel sounds. Extremities: no cyanosis, clubbing, rash, edema Neuro: alert & oriented x 3, cranial nerves grossly intact. moves all 4 extremities w/o difficulty. Affect pleasant.    ASSESSMENT & PLAN: 1. Chronic Systolic /Diastolic Heart Failure, NICM ECHO 12/2017 EF 35-40%. LHC 2/219  with nonobstructive CAD.  ECHO 01/2019, EF 35-40%. Now complains of worsening fatigue and dyspnea after COVID 19 infection 06/2019 - Will repeat 2D echo to ensure no drop in EF after recent viral infection - NYHA Class III but euvolemic by exam and by ReDs clip measurement at 32%.  - Continue lisinopril 10 mg qhs.  He was intolerant entresto due to dizziness.  - Continue Coreg 6.25 mg bid - Add spironolactone 12.5 mg daily - Add Digoxin 0.125 mg daily  - Check BMP today and again in 7 days along w/ digoxin level.    2. HTN - controlled on current regimen. No orthostatic symptoms.   3. Memory Loss - On Aricept per Neurology  - Paramedicine to continue to help with medications.   4. Uncotrolled DM - Most recent hgb  A1C 11.9  - Management per PCP - Consider later addition of SGLT2i    5. CAD:  -mild nonobstructive CAD by cath 08/2017 - no chest pain - no ASA w/ Eliquis  - continue  blocker - Zetia for cholesterol  9. PAF - recent EKG 08/05/19 showed NSR. RRR on exam today.  - continue coreg for rate control - Eliquis for a/c. Denies abnormal bleeding.    F/u w/ provider in 4 weeks.    Lyda Jester, [name] 12:19 PM

## 2019-08-20 NOTE — Patient Instructions (Signed)
Lab work done today. We will notify you of any abnormal lab work. No news is good news.  Lab work will need to be done again after you receive your medications in the mail.  START Spironolactone 12.88m (0.5 tab) daily.  START Digoxin 0.1216mtab daily.  Your physician has requested that you have an echocardiogram. Echocardiography is a painless test that uses sound waves to create images of your heart. It provides your doctor with information about the size and shape of your heart and how well your heart's chambers and valves are working. This procedure takes approximately one hour. There are no restrictions for this procedure.  [facility] with [facility].  At the [facility] and your health needs are our priority. As part of our continuing mission to provide you with exceptional heart care, we have created designated Provider Care Teams. These Care Teams include your primary Cardiologist (physician) and Advanced Practice Providers (APPs- Physician Assistants and Nurse Practitioners) who all work together to provide you with the care you need, when you need it.   You may see any of the following providers on your designated Care Team at your next follow up: . Marland Kitchenr DaGlori Bickers Dr DaLoralie Champagne AmDarrick GrinderNP . BrLyda JesterPA . LaAudry RilesPharmD   Please be sure to bring in all your medications bottles to every appointment.

## 2019-08-20 NOTE — Progress Notes (Signed)
Paramedicine Encounter   Patient ID: Calvin [name] , male,   DOB: Sep 21, 1960,58 y.o.,  MRN: [id number]   Met patient in clinic today with provider.  CBG yesterday- 225  Weight @ clinic-162 B/p-136/74 p-93 sp02-99  Pt here for follow up, its been awhile since he was seen in clinic.  No new symptoms since last visit.  He is feeling tired this morning. He did not check his CBG this morning, labs were done today in clinic. He did not eat breakfast this morning yet either.  [place] is adding spiro and digoxin to his regimen. He will need labs once he starts those meds which comes in mail so I will have to call once he gets those and sch that.  REDS clip is 32% meds verified and pill box refilled.   Marylouise Stacks, [place] 08/20/2019

## 2019-08-22 ENCOUNTER — Telehealth: Payer: Self-pay | Admitting: Endocrinology

## 2019-08-22 ENCOUNTER — Ambulatory Visit: Payer: Self-pay | Admitting: Pharmacist

## 2019-08-22 ENCOUNTER — Other Ambulatory Visit: Payer: Self-pay | Admitting: Pharmacist

## 2019-08-22 NOTE — Patient Outreach (Addendum)
[facility]) Care Management  [place] 08/22/2019  Calvin [name] [birth year] [id number]  Update received today from Calvin [name] insurance plan:  "I have confirmed that the member has active LIS 4 for the 2021 Plan year.As per Knowledge [employer], Part D Senior Savings ModelEligibilityMembers eligible for the program include.Non-LIS members.PDP Preferred plan members (excludes territories).MAPD MAPD, C-SNP, IE-SNP, Connector Model (Non-defined Standard plans)Note LIS members will not participate in the programs and will pay their standard LIS copays for the preferred insulins.Per my review of RxClaim, I verified the member is currently in the Coverage Gap. The member's [place] claim on 07/15/19 processed as a crossover claim taking the member from the Deductible Phase to the Initial Coverage Phase. Because the CMS Defined Standard benefit cost share is less than the member's LIS 4 benefit cost share, the member is responsible for the CMS Defined Standard benefit cost share; $142.00 ($95 Deductible + $47.00 copay). The member will always be responsible for the lesser cost share. Otherwise, below is a breakdown of how the claim would have processed with the member's LIS 4 benefit $2,207.96 (Contracted Rate) + $.75 (Dispensing Fee) = $2,208.71 - $92 (Deductible) = $2,116.71 x 15% (LIS 4 Coinsurance) = $317.51 + $92 (Deductible) = $409.51 (Total Member LIS 4 Responsibility)The member's [place] claim on 08/07/19 processed as a crossover claim taking the member from the Initial Coverage Phase to the Coverage Gap Phase. Since the member met the total Drug Spend accumulation of $4130.00 in the Initial Coverage Phase, the member is now responsible for their LIS 4 cost share until they reach the Catastrophic Phase. The member's plan benefits in the Benefit Matrix show they have CMS Defined Standard cost sharing during the Coverage Gap. Because the member's LIS benefit cost share is less than the CMS  Defined Standard benefit cost share, the member is responsible for the LIS cost share; 15% coinsurance. Unfortunately, because this is a brand drug crossing into the Coverage Gap Phase, the member's plan responsibility during the Gap Phase is a 95% coinsurance on the allowed amount of cost, not the 25% coinsurance that might normally be expected during the Coverage Gap Phase. This is because LIS members are excluded from participating in the Calvin [name]. The member will always be responsible for the lesser cost share. Below is a breakdown of how the claim processed Submitted Cost - $2,495.89 Allowed Cost - $2,208.71What applied to the ICL - $1,784.82Plan benefit in ICL - 15% coinsuranceMember's Cost in ICL - $267.72What applied to the Gap - $423.89 Plan Benefit in Gap - 15% coinsuranceMember's Cost in Gap - $63.59 Total Member Cost - $331.31Total Plan Payment - $1,877.40All claims are processing correctly and for the appropriate cost share."  In summary, 1) patient's plan will not allow patient to be included in insulin savings program as he has LIS 4 (Partial Extra Help) and 2) confirms co-pay amount for insulin will be $331 / month which patient is unable to afford.   PAP options for insulin -Toujeo and [place] programs: patient NOT eligible due to having partial LIS Sport and exercise psychologist program with Calvin [name]:  only manufacturer that will accept an application from patient with partial LIS.     -Patient has completed initial paperwork but still working on obtaining proof of income documents to submit for application.    [name]  She is assisting with obtaining documents from patient.    Call placed to Calvin [name] to request refill on Repatha.  Repatha successfully dual-billed to  [employer] / [employer] for $0 co-pay.  Updated patient who will pick up medication later today.    Plan: Will update endocrinologist and Calvin [name] who will f/u with provider on appropriate  PAP forms needed for insulin.    Calvin [name], PharmD, [place] [phone]

## 2019-08-22 NOTE — Progress Notes (Signed)
Date: 08/28/2019 Status: Sch  Time: 11:15 AM Length: 15  Visit Type: FOLLOW UP 15 [1002]     Pt has been scheduled as indicated above.

## 2019-08-23 ENCOUNTER — Ambulatory Visit: Payer: Medicare Other | Admitting: Dietician

## 2019-08-23 ENCOUNTER — Other Ambulatory Visit: Payer: Self-pay | Admitting: Pharmacy Technician

## 2019-08-23 NOTE — Patient Outreach (Signed)
[facility]) Care Management  08/23/2019  Calvin [name] 05-Sep-1960 [id number]   Patient unable to apply for [place] thru [place] thru Calvin [name] due to having partial low income subsidy.  Received request from [place] to fax provider portion of [employer] application for Basaglar to Dr. Loanne Drilling for completion since Ralph Leyden allows patients to apply that have low income subsidy.  -Faxed to providers office for completion  [employer]. [name] [place] Certified Pharmacy Technician [place] Management Direct Dial:[phone]   \

## 2019-08-26 ENCOUNTER — Other Ambulatory Visit: Payer: Self-pay

## 2019-08-26 ENCOUNTER — Other Ambulatory Visit: Payer: Self-pay | Admitting: *Deleted

## 2019-08-26 NOTE — Patient Outreach (Signed)
[facility]) Care Management  08/26/2019  [place] Andrus 02/17/1961 [id number]   Call placed to member to follow up on diabetes management.  He report he is still having blood sugars in the mid to upper 200's.  State he has been trying to change his diet, eating more salads and using lite dressing.  He has appointment with RD this week as well as with endocrinologist.  Report he has been compliant with medication/insulin management.  Was seen in the heart failure clinic last week, will have visit from EMT this Thursday.  State weight has been stable, was 157 pounds today, denies any swelling.  He does question the use of new medication Repatha.  State he has used it in the past but was provided a new prescription for it.  Educated on use, verbalizes understanding.  Denies any urgent concerns at this time, will follow up with member within the next month.  THN CM Care Plan Problem One     Most Recent Value  Care Plan Problem One  Knowledge deficit related to disease process and mgmt of Diabetes  Role Documenting the Problem One  Care Management Telephonic Coordinator  Care Plan for Problem One  Active  [facility] Long Term Goal   Patient will report a lowering/decrease in A1C level of 11.0 over the next 90 days.  THN Long Term Goal Start Date  08/26/19 [Not met, date reset]  Interventions for Problem One Long Term Goal  Diabetes management reivewed, including diet and importance of following meal plan  [facility]N CM Short Term Goal #1   Member will report keeping and attending follow up appointments with endocrinologist and RD within the next 2 weeks  THN CM Short Term Goal #1 Start Date  08/26/19  Interventions for Short Term Goal #1  Reviewed upcoming appointments with member. Assessed the need for transportation resources.    THN CM Care Plan Problem Three     Most Recent Value  Care Plan Problem Three  Patient at risk for hospital readmission due to co-morbidities.  Role  Documenting the Problem Three  Care Management Telephonic Coordinator  Care Plan for Problem Three  Active  THN Long Term Goal   Patient will have no readmission to hospital over the next 31 days.  THN Long Term Goal Start Date  07/08/19  THN Long Term Goal Met Date  08/26/19  [facility] CM Short Term Goal #1   Patient will complete all MD follow up appts in the next 30 days.  THN CM Short Term Goal #1 Start Date  07/08/19  [facility]N CM Short Term Goal #1 Met Date  08/26/19  [facility]N CM Short Term Goal #2   Patient will report adhering to med regimen 100% of the time over the next 30 days.  THN CM Short Term Goal #2 Start Date  07/08/19  [facility] CM Short Term Goal #2 Met Date  08/26/19     [name], RN, MSN [place] Manager [phone]

## 2019-08-27 ENCOUNTER — Encounter: Payer: Self-pay | Admitting: Neurology

## 2019-08-27 ENCOUNTER — Telehealth (INDEPENDENT_AMBULATORY_CARE_PROVIDER_SITE_OTHER): Payer: Medicare Other | Admitting: Neurology

## 2019-08-27 ENCOUNTER — Other Ambulatory Visit (HOSPITAL_COMMUNITY): Payer: Self-pay

## 2019-08-27 DIAGNOSIS — G3184 Mild cognitive impairment, so stated: Secondary | ICD-10-CM

## 2019-08-27 DIAGNOSIS — R519 Headache, unspecified: Secondary | ICD-10-CM | POA: Diagnosis not present

## 2019-08-27 DIAGNOSIS — F413 Other mixed anxiety disorders: Secondary | ICD-10-CM | POA: Diagnosis not present

## 2019-08-27 MED ORDER — DONEPEZIL HCL 10 MG PO TABS: 10.0000 mg | ORAL_TABLET | Freq: Every day | ORAL | 3 refills | Status: DC

## 2019-08-27 MED ORDER — AMITRIPTYLINE HCL 10 MG PO TABS: ORAL_TABLET | ORAL | 3 refills | Status: DC

## 2019-08-27 NOTE — Progress Notes (Signed)
Paramedicine Encounter    Patient ID: Calvin [name], male    DOB: 01-10-1961, 59 y.o.   MRN: [id number]   [name], NP as PCP - General (Geriatric Medicine) [name], MD as PCP - Cardiology (Cardiology) [name], [name], MD as PCP - Advanced Heart Failure (Cardiology) [name], MD as Consulting Physician (Endocrinology) Rudean Haskell, [facility] as [place] Management (Pharmacist) [name], RN as [place] Management Adaline Sill, CPhT as [employer] banker, corporate)  Patient Active Problem List   Diagnosis Date Noted  . COVID-19 virus detected 06/18/2019  . Right sided weakness 06/17/2019  . AKI (acute kidney injury) ([place]) 06/17/2019  . Foot pain, bilateral 02/01/2019  . Abdominal pain 01/23/2019  . Chest pain 01/23/2019  . Right leg pain 12/20/2018  . Overgrown toenails 12/20/2018  . Unsteady gait 12/20/2018  . Encounter for therapeutic drug monitoring 10/22/2018  . Nausea and vomiting 03/10/2018  . AF (paroxysmal atrial fibrillation) ([place]) 03/10/2018  . Non-cardiac chest pain 03/09/2018  . Diabetic neuropathy ([place]) 09/26/2017  . MCI (mild cognitive impairment) 06/23/2017  . OSA on CPAP 05/10/2017  . Gastroesophageal reflux disease 05/10/2017  . Insomnia 05/10/2017  . Onychomycosis of multiple toenails with type 2 diabetes mellitus and peripheral neuropathy ([place]) 05/10/2017  . Chronic systolic heart failure ([place]) 11/22/2015  . Essential hypertension 11/22/2015  . DM (diabetes mellitus) ([place]) 11/20/2015  . HLD (hyperlipidemia) 11/20/2015  . Hx of adenomatous colonic polyps 05/19/2011    Current Outpatient Medications:  .  albuterol (VENTOLIN HFA) 108 (90 Base) MCG/ACT inhaler, Inhale 2 puffs into the lungs every 6 (six) hours as needed for wheezing or shortness of breath., Disp: 6.7 g, Rfl: 0 .  amitriptyline (ELAVIL) 10 MG tablet, Take  1 tablet at bed time for one week and then increase 2 tablets at bed time, Disp: 180 tablet, Rfl: 0 .  apixaban (ELIQUIS) 5 MG TABS tablet, Take 5 mg by mouth 2 (two) times daily., Disp: , Rfl:  .  carvedilol (COREG) 6.25 MG tablet, Take 1 tablet (6.25 mg total) by mouth 2 (two) times daily with a meal., Disp: 180 tablet, Rfl: 3 .  digoxin (LANOXIN) 0.125 MG tablet, Take 1 tablet (0.125 mg total) by mouth daily., Disp: 90 tablet, Rfl: 3 .  donepezil (ARICEPT) 10 MG tablet, Take 1 tablet (10 mg total) by mouth at bedtime., Disp: 90 tablet, Rfl: 0 .  Evolocumab (REPATHA SURECLICK) 938 MG/ML SOAJ, Inject 1 pen into the skin every 14 (fourteen) days., Disp: 6 pen, Rfl: 3 .  ezetimibe (ZETIA) 10 MG tablet, Take 1 tablet (10 mg total) by mouth daily., Disp: 90 tablet, Rfl: 3 .  glucose blood (CONTOUR TEST) test strip, 1 each by Other route 2 (two) times a day., Disp: 200 each, Rfl: 3 .  Insulin Glargine, 2 Unit Dial, (TOUJEO MAX SOLOSTAR) 300 UNIT/ML SOPN, Inject 250 Units into the skin every morning. And pen needles 1/day, Disp: 8 pen, Rfl: 11 .  Insulin Syringe-Needle U-100 (INSULIN SYRINGE .3CC/29GX1/2") 29G X 1/2" 0.3 ML MISC, Inject 1 Syringe 3 (three) times daily as directed. Check blood sugar three times daily. Dx: E11.9, Disp: 100 each, Rfl: 3 .  lisinopril (ZESTRIL) 10 MG tablet, Take 1 tablet (10 mg total) by mouth at bedtime., Disp: 90 tablet, Rfl: 3 .  polyethylene glycol (MIRALAX / GLYCOLAX) 17 g packet, Take 17 g by mouth 2 (two) times daily., Disp: 14  each, Rfl: 0 .  senna (SENOKOT) 8.6 MG TABS tablet, Take 1 tablet (8.6 mg total) by mouth daily as needed for mild constipation., Disp: 15 tablet, Rfl: 0 .  spironolactone (ALDACTONE) 25 MG tablet, Take 0.5 tablets (12.5 mg total) by mouth daily. (Patient taking differently: Take 12.5 mg by mouth at bedtime. ), Disp: 90 tablet, Rfl: 3 .  nitroGLYCERIN (NITROSTAT) 0.4 MG SL tablet, Place 1 tablet (0.4 mg total) under the tongue every 5 (five)  minutes as needed for chest pain. (Patient not taking: Reported on 08/27/2019), Disp: 30 tablet, Rfl: 1 No Known Allergies    Social History   Socioeconomic History  . Marital status: Single    Spouse name: Not on file  . Number of children: 0  . Years of education: Not on file  . Highest education level: Not on file  Occupational History  . Occupation: Disability  Tobacco Use  . Smoking status: Never Smoker  . Smokeless tobacco: Never Used  Substance and Sexual Activity  . Alcohol use: No  . Drug use: No  . Sexual activity: Yes    Birth control/protection: None  Other Topics Concern  . Not on file  Social History Narrative   Social History   Diet?    Do you drink/eat things with caffeine? yes   Marital status?       single      Do you live in a house, apartment, assisted living, condo, trailer, etc.? yes   Is it one or more stories? One story   How many persons live in your home?   Do you have any pets in your home? (please list)    Highest level of education completed? graduate   Do you exercise?            no                          Type & how often?   Advanced Directives   Do you have a living will? no   Do you have a DNR form?                                  If not, do you want to discuss one? no   Do you have signed POA/HPOA for forms? no      Functional Status   Do you have difficulty bathing or dressing yourself? no   Do you have difficulty preparing food or eating? no   Do you have difficulty managing your medications? no   Do you have difficulty managing your finances? no   Do you have difficulty affording your medications? no   Social Determinants of Health   Financial Resource Strain:   . Difficulty of Paying Living Expenses: Not on file  Food Insecurity: No Food Insecurity  . Worried About Charity fundraiser in the Last Year: Never true  . Ran Out of Food in the Last Year: Never true  Transportation Needs: Unmet Transportation Needs  . Lack of  Transportation (Medical): Yes  . Lack of Transportation (Non-Medical): Yes  Physical Activity:   . Days of Exercise per Week: Not on file  . Minutes of Exercise per Session: Not on file  Stress:   . Feeling of Stress : Not on file  Social Connections:   . Frequency of Communication with Friends and Family: Not on file  .  Frequency of Social Gatherings with Friends and Family: Not on file  . Attends Religious Services: Not on file  . Active Member of Clubs or Organizations: Not on file  . Attends Archivist Meetings: Not on file  . Marital Status: Not on file  Intimate Partner Violence:   . Fear of Current or Ex-Partner: Not on file  . Emotionally Abused: Not on file  . Physically Abused: Not on file  . Sexually Abused: Not on file    Physical Exam      Future Appointments  Date Time Provider [place]  08/27/2019  4:00 PM Cameron Sprang, MD LBN-LBNG None  08/28/2019  9:00 AM [name], NP PSC-PSC None  08/28/2019 11:15 AM [name], MD LBPC-LBENDO None  08/30/2019  8:00 AM Christella Hartigan, RD [place] NDM  09/03/2019 11:00 AM MC-HVSC LAB MC-HVSC None  09/05/2019  3:45 PM Summe, Colleen E, RPH THN-COM None  09/27/2019  2:00 PM [place] ECHO OP 1 MC-ECHOLAB [facility]  09/27/2019  3:00 PM [name], [name], MD MC-HVSC None    BP 130/78   Pulse 96   Temp 97.8 F (36.6 C)   Resp 16   Wt 157 lb (71.2 kg)   SpO2 99%   BMI 24.59 kg/m   Weight yesterday-157 Last visit weight-162 @ clinic [facility] PTA-245 before breakfast   Pt reports hes doing good.  He got letter in mail from extra help needing more info to update his address, sign and return-that was done-he will place in mail today. He has not gotten income statement yet-he called last Friday and was told it would be 7-10 business days before he would receive it in mail.  He got the repatha and it was for $0.  He has eliquis and he got it from [facility] in 12/20 but he was in hosp for so long he still has a  lot of that left so we are good for a bit longer.  He has f/u with dr Loanne Drilling about his insulin.  He has the spiro and digoxin that was added at his last clinic appoint so I will sch that lab appoint-- 3/2 @ 11.    Marylouise Stacks, [place] [facility] Paramedic  08/27/19

## 2019-08-27 NOTE — Progress Notes (Signed)
Telephone (Audio) Visit The purpose of this telephone visit is to provide medical care while limiting exposure to the novel coronavirus.    Consent was obtained for telephone visit:  Yes.   Answered questions that patient had about telehealth interaction:  Yes.   I discussed the limitations, risks, security and privacy concerns of performing an evaluation and management service by telephone. I also discussed with the patient that there may be a patient responsible charge related to this service. The patient expressed understanding and agreed to proceed.  Pt location: Home Physician Location: office Name of referring provider:  [name], NP I connected with .Calvin [name] at patients initiation/request on 08/27/2019 at  4:00 PM EST by telephone and verified that I am speaking with the correct person using two identifiers.  Pt MRN:  [id number] Pt DOB:  September 22, 1960   History of Present Illness:  The patient had a telephone visit on 08/27/2019. He was last seen in the neurology clinic in November 2019 when he presented for memory loss. He was also reporting daily headaches. MRI brain in 2019 no acute changes. He had another MRI brain in 06/2019 for right-sided weakness which was normal. On his visit in 2019, he was reporting pain over the right side of his body from the arm, down his trunk and leg for the past 2-3 years. I personally reviewed MRI cervical spine without contrast done 06/2018 which did not show any compressive central canal stenosis. There was foraminal narrowing that could possibly cause neural compression at C3-4, C4-5, C5-6. He had an MRI lumbar spine in 06/2019 which showed small left foraminal dis protrusion at L4-5, closely approximating and potentially irritating the exiting left L4 nerve root, mild left greater than right L4 foraminal stenosis, degenerative disc disease. He did PT but still feels weaker on the whole right side. He also had Covid-19 in December and  still has problem breathing, unable to talk long. He was started on amitriptyline 43m qhs in May 2020 for daily headaches, this has helped significantly, headaches are now off and on around twice a week. No side effects. His memory "comes and goes." He is on Donepezil 139mdaily without side effects. He is trying to get help with bills because he keeps forgetting. He denies missing medications. He drives infrequently, someone is always with him. Sleep is good but he does not "sleep-sleep." He feels drowsy during the day. He had a repeat sleep study over a year ago and was told he no longer has sleep apnea.    History On Initial Assessment 05/09/2018: This is a pleasant [age]ear old right-handed man with a history of nonischemic cardiomyopathy, paroxysmal atrial fibrillation on anticoagulation with Eliquis, hypertension, hyperlipidemia, diabetes mellitus, sleep apnea, presenting for evaluation of memory loss. He started noticing memory changes around 1-1.5 years ago. He has started to need to write down when he takes his medications or doctors appointments, otherwise he would forget them. He lives with his goddaughter but manages bills, he states he forgets them at least once a month but tries to put them in a pile as a reminder. He stopped driving 6 years ago due to vision issues. He has noticed increased irritability over the same time period, he gets ill over little things several times a week. No hallucinations. He has poor appetite. He has a diagnosis of sleep apnea but has lost his machine several years ago. He started having heart issues in his early 5970sEF in March 2019 was  20-25%, improved to 35-40% on last echo done June 2019. He has difficulties with his glucose levels, most recent HbA1c last September 2019 was 11.9. He had an MMSE of 21/30 in December 2018 and has been taking Donepezil 9m daily without side effects.  He has had daily headaches for the past 2 years, usually with pressure over the  right temporal region, lasting 2-3 hours, then coming back. No associated nausea/vomiting, photo/phonophobia. He does not take any medications for the headaches. He gets lightheaded all the time and has a history of orthostatic hypotension. Vision is blurred without his glasses. No dysarthria/dysphagia, neck/back pain, bladder dysfunction. He has occasional constipation. He has had pain over the right side of his body from the arm, down his trunk and leg for the past 2-3 years. Both feet get numb. No anosmia. He has occasional tremors. His parents had memory issues. No history of significant head injuries or alcohol use.   I personally reviewed MRI/MRA brain done 10/2017 which did not show any acute changes, there was minimal chronic microvascular disease.    MEDICATIONS: Current Outpatient Medications on File Prior to Visit  Medication Sig Dispense Refill  . albuterol (VENTOLIN HFA) 108 (90 Base) MCG/ACT inhaler Inhale 2 puffs into the lungs every 6 (six) hours as needed for wheezing or shortness of breath. 6.7 g 0  . amitriptyline (ELAVIL) 10 MG tablet Take 1 tablet at bed time for one week and then increase 2 tablets at bed time 180 tablet 0  . apixaban (ELIQUIS) 5 MG TABS tablet Take 5 mg by mouth 2 (two) times daily.    . carvedilol (COREG) 6.25 MG tablet Take 1 tablet (6.25 mg total) by mouth 2 (two) times daily with a meal. 180 tablet 3  . digoxin (LANOXIN) 0.125 MG tablet Take 1 tablet (0.125 mg total) by mouth daily. 90 tablet 3  . donepezil (ARICEPT) 10 MG tablet Take 1 tablet (10 mg total) by mouth at bedtime. 90 tablet 0  . Evolocumab (REPATHA SURECLICK) 1950MG/ML SOAJ Inject 1 pen into the skin every 14 (fourteen) days. 6 pen 3  . ezetimibe (ZETIA) 10 MG tablet Take 1 tablet (10 mg total) by mouth daily. 90 tablet 3  . glucose blood (CONTOUR TEST) test strip 1 each by Other route 2 (two) times a day. 200 each 3  . Insulin Glargine, 2 Unit Dial, (TOUJEO MAX SOLOSTAR) 300 UNIT/ML SOPN  Inject 250 Units into the skin every morning. And pen needles 1/day 8 pen 11  . Insulin Syringe-Needle U-100 (INSULIN SYRINGE .3CC/29GX1/2") 29G X 1/2" 0.3 ML MISC Inject 1 Syringe 3 (three) times daily as directed. Check blood sugar three times daily. Dx: E11.9 100 each 3  . lisinopril (ZESTRIL) 10 MG tablet Take 1 tablet (10 mg total) by mouth at bedtime. 90 tablet 3  . nitroGLYCERIN (NITROSTAT) 0.4 MG SL tablet Place 1 tablet (0.4 mg total) under the tongue every 5 (five) minutes as needed for chest pain. (Patient not taking: Reported on 08/27/2019) 30 tablet 1  . polyethylene glycol (MIRALAX / GLYCOLAX) 17 g packet Take 17 g by mouth 2 (two) times daily. 14 each 0  . senna (SENOKOT) 8.6 MG TABS tablet Take 1 tablet (8.6 mg total) by mouth daily as needed for mild constipation. 15 tablet 0  . spironolactone (ALDACTONE) 25 MG tablet Take 0.5 tablets (12.5 mg total) by mouth daily. (Patient taking differently: Take 12.5 mg by mouth at bedtime. ) 90 tablet 3   No current  facility-administered medications on file prior to visit.    Observations/Objective:  Exam limited due to nature of phone visit. Patient is awake, alert, able to answer questions without dysarthria or confusion.  Assessment and Plan:   This is a pleasant 59 yo RH man with a history of  nonischemic cardiomyopathy, paroxysmal atrial fibrillation on anticoagulation with Eliquis, hypertension, hyperlipidemia, diabetes mellitus, sleep apnea, who presented for memory loss. He is on Donepezil 26m daily, refills sent. He has had a good response to low dose amitriptyline with reduction in headaches, increase to 269mqhs. This may help with sleep as well. He continues to report weakness on his right side, MRI brain, cervical, and lumbar spine unremarkable. Etiology unclear. Continue to monitor. Follow-up in 6 months, he knows to call for any changes.    Follow Up Instructions:   -I discussed the assessment and treatment plan with the  patient. The patient was provided an opportunity to ask questions and all were answered. The patient agreed with the plan and demonstrated an understanding of the instructions.   The patient was advised to call back or seek an in-person evaluation if the symptoms worsen or if the condition fails to improve as anticipated.    Total Time spent in visit with the patient was:  7:18 minutes, of which 100% of the time was spent in counseling and/or coordinating care on the above.   Pt understands and agrees with the plan of care outlined.     KaC[place]on SprangMD

## 2019-08-28 ENCOUNTER — Other Ambulatory Visit: Payer: Self-pay | Admitting: Pharmacist

## 2019-08-28 ENCOUNTER — Other Ambulatory Visit: Payer: Self-pay

## 2019-08-28 ENCOUNTER — Ambulatory Visit (INDEPENDENT_AMBULATORY_CARE_PROVIDER_SITE_OTHER): Payer: Medicare Other | Admitting: Nurse Practitioner

## 2019-08-28 ENCOUNTER — Ambulatory Visit (INDEPENDENT_AMBULATORY_CARE_PROVIDER_SITE_OTHER): Payer: Medicare Other | Admitting: Endocrinology

## 2019-08-28 ENCOUNTER — Telehealth: Payer: Self-pay | Admitting: Endocrinology

## 2019-08-28 ENCOUNTER — Encounter: Payer: Self-pay | Admitting: Nurse Practitioner

## 2019-08-28 ENCOUNTER — Encounter: Payer: Self-pay | Admitting: Endocrinology

## 2019-08-28 VITALS — BP 124/70 | HR 82 | Ht 64.0 in | Wt 163.0 lb

## 2019-08-28 VITALS — BP 140/78 | HR 85 | Temp 96.9°F | Ht 64.0 in | Wt 163.0 lb

## 2019-08-28 DIAGNOSIS — E1140 Type 2 diabetes mellitus with diabetic neuropathy, unspecified: Secondary | ICD-10-CM

## 2019-08-28 DIAGNOSIS — R5383 Other fatigue: Secondary | ICD-10-CM

## 2019-08-28 DIAGNOSIS — Z794 Long term (current) use of insulin: Secondary | ICD-10-CM | POA: Diagnosis not present

## 2019-08-28 DIAGNOSIS — K5909 Other constipation: Secondary | ICD-10-CM

## 2019-08-28 DIAGNOSIS — I10 Essential (primary) hypertension: Secondary | ICD-10-CM | POA: Diagnosis not present

## 2019-08-28 DIAGNOSIS — R0602 Shortness of breath: Secondary | ICD-10-CM

## 2019-08-28 DIAGNOSIS — G3184 Mild cognitive impairment, so stated: Secondary | ICD-10-CM

## 2019-08-28 DIAGNOSIS — U071 COVID-19: Secondary | ICD-10-CM

## 2019-08-28 LAB — CBC WITH DIFFERENTIAL/PLATELET
Absolute Monocytes: 340 cells/uL (ref 200–950)
Basophils Absolute: 32 cells/uL (ref 0–200)
Basophils Relative: 0.7 %
Eosinophils Absolute: 189 cells/uL (ref 15–500)
Eosinophils Relative: 4.1 %
HCT: 38.9 % (ref 38.5–50.0)
Hemoglobin: 13.2 g/dL (ref 13.2–17.1)
Lymphs Abs: 1909 cells/uL (ref 850–3900)
MCH: 31.2 pg (ref 27.0–33.0)
MCHC: 33.9 g/dL (ref 32.0–36.0)
MCV: 92 fL (ref 80.0–100.0)
MPV: 12.2 fL (ref 7.5–12.5)
Monocytes Relative: 7.4 %
Neutro Abs: 2130 cells/uL (ref 1500–7800)
Neutrophils Relative %: 46.3 %
Platelets: 232 10*3/uL (ref 140–400)
RBC: 4.23 10*6/uL (ref 4.20–5.80)
RDW: 12.8 % (ref 11.0–15.0)
Total Lymphocyte: 41.5 %
WBC: 4.6 10*3/uL (ref 3.8–10.8)

## 2019-08-28 LAB — COMPLETE METABOLIC PANEL WITH GFR
AG Ratio: 1.9 (calc) (ref 1.0–2.5)
ALT: 12 U/L (ref 9–46)
AST: 12 U/L (ref 10–35)
Albumin: 3.8 g/dL (ref 3.6–5.1)
Alkaline phosphatase (APISO): 59 U/L (ref 35–144)
BUN: 11 mg/dL (ref 7–25)
CO2: 27 mmol/L (ref 20–32)
Calcium: 9.1 mg/dL (ref 8.6–10.3)
Chloride: 105 mmol/L (ref 98–110)
Creat: 1.17 mg/dL (ref 0.70–1.33)
GFR, Est African American: 79 mL/min/{1.73_m2} (ref >=60)
GFR, Est Non African American: 68 mL/min/{1.73_m2} (ref >=60)
Globulin: 2 g/dL (calc) (ref 1.9–3.7)
Glucose, Bld: 258 mg/dL — ABNORMAL HIGH (ref 65–139)
Potassium: 3.9 mmol/L (ref 3.5–5.3)
Sodium: 139 mmol/L (ref 135–146)
Total Bilirubin: 0.4 mg/dL (ref 0.2–1.2)
Total Protein: 5.8 g/dL — ABNORMAL LOW (ref 6.1–8.1)

## 2019-08-28 LAB — POCT GLYCOSYLATED HEMOGLOBIN (HGB A1C): Hemoglobin A1C: 9 % — AB (ref 4.0–5.6)

## 2019-08-28 MED ORDER — BASAGLAR KWIKPEN 100 UNIT/ML ~~LOC~~ SOPN: 280.0000 [IU] | PEN_INJECTOR | SUBCUTANEOUS | 11 refills | Status: DC

## 2019-08-28 MED ORDER — TRESIBA FLEXTOUCH 200 UNIT/ML ~~LOC~~ SOPN: 280.0000 [IU] | PEN_INJECTOR | Freq: Every day | SUBCUTANEOUS | 3 refills | Status: DC

## 2019-08-28 MED ORDER — CONTOUR MONITOR W/DEVICE KIT: 1.0000 | PACK | Freq: Once | 0 refills | Status: AC

## 2019-08-28 NOTE — Patient Instructions (Addendum)
Please increase the Tresiba to 280 units daily. I have sent a prescription to your pharmacy, for a blood sugar monitor. check your blood sugar twice a day.  vary the time of day when you check, between before the 3 meals, and at bedtime.  also check if you have symptoms of your blood sugar being too high or too low.  please keep a record of the readings and bring it to your next appointment here (or you can bring the meter itself).  You can write it on any piece of paper.  please call us sooner if your blood sugar goes below 70, or if you have a lot of readings over 200. Please come back for a follow-up appointment in 6 weeks.

## 2019-08-28 NOTE — Progress Notes (Deleted)
Careteam: [name], NP as PCP - General (Geriatric Medicine) [name], MD as PCP - Cardiology (Cardiology) [name], [name], MD as PCP - Advanced Heart Failure (Cardiology) [name], MD as Consulting Physician (Endocrinology) Rudean Haskell, [facility] as [place] Management (Pharmacist) [name], RN as [place] Management Adaline Sill, CPhT as [employer], corporate)  Advanced Directive information Does Patient Have a Catering manager?: No, Would patient like information on creating a medical advance directive?: Yes (MAU/Ambulatory/Procedural Areas - Information given)(Given paperwork at previous visit)  No Known Allergies  Chief Complaint  Patient presents with  . Follow-up    6 week follow-up. Moderate fall risk      HPI: Patient is a 59 y.o. Calvin [name] ***  Review of Systems:  ROS***  Past Medical History:  Diagnosis Date  . Adenoidal hypertrophy   . Adenomatous colon polyps 2012  . Arthritis   . Diabetes mellitus   . GERD (gastroesophageal reflux disease)   . Heart attack ([place])    [place]  [name] Hx of adenomatous colonic polyps 08/18/2017  . Hyperlipidemia   . Hypertension   . Mild CAD    a. cath in 08/2017 showing mild nonobstructive CAD with scattered 20-30% stenosis.   . Nonischemic cardiomyopathy ([place])    a. EF 20-25% by echo in 09/2017 with cath showing mild CAD. b.  Last echo 12/2017 EF 35-40%, grade 2 DD.  [name] Obesity   . PAF (paroxysmal atrial fibrillation) ([place])   . Sleep apnea    cpap, pt says no longer has   Past Surgical History:  Procedure Laterality Date  . COLONOSCOPY  05/13/11   9 adenomas  . FOREARM SURGERY    . MUSCLE BIOPSY    . RIGHT/LEFT HEART CATH AND CORONARY ANGIOGRAPHY N/A 08/25/2017   Procedure: RIGHT/LEFT HEART CATH AND CORONARY ANGIOGRAPHY;  Surgeon: [name], MD;   Location: [facility] [facility];  Service: Cardiovascular;  Laterality: N/A;   Social History:   reports that he has never smoked. He has never used smokeless tobacco. He reports that he does not drink alcohol or use drugs.  Family History  Problem Relation Age of Onset  . Heart disease Mother   . Heart attack Mother 52  . Hypertension Mother   . Hyperlipidemia Mother   . Diabetes Mother   . Heart disease Father   . Heart attack Father 74  . Hypertension Father   . Hyperlipidemia Father   . Diabetes Father   . Heart attack Sister 58  . Colon cancer Neg Hx   . Stomach cancer Neg Hx   . Esophageal cancer Neg Hx   . Rectal cancer Neg Hx     Medications: Patient's Medications  New Prescriptions   No medications on file  Previous Medications   ALBUTEROL (VENTOLIN HFA) 108 (90 BASE) MCG/ACT INHALER    Inhale 2 puffs into the lungs every 6 (six) hours as needed for wheezing or shortness of breath.   AMITRIPTYLINE (ELAVIL) 10 MG TABLET    Take 2 tablets at bed time   APIXABAN (ELIQUIS) 5 MG TABS TABLET    Take 5 mg by mouth 2 (two) times daily.   CARVEDILOL (COREG) 6.25 MG TABLET    Take 1 tablet (6.25 mg total) by mouth 2 (two) times daily with a meal.   DIGOXIN (LANOXIN) 0.125 MG TABLET  Take 1 tablet (0.125 mg total) by mouth daily.   DONEPEZIL (ARICEPT) 10 MG TABLET    Take 1 tablet (10 mg total) by mouth at bedtime.   EVOLOCUMAB (REPATHA SURECLICK) 629 MG/ML SOAJ    Inject 1 pen into the skin every 14 (fourteen) days.   EZETIMIBE (ZETIA) 10 MG TABLET    Take 1 tablet (10 mg total) by mouth daily.   GLUCOSE BLOOD (CONTOUR TEST) TEST STRIP    1 each by Other route 2 (two) times a day.   INSULIN GLARGINE, 2 UNIT DIAL, (TOUJEO MAX SOLOSTAR) 300 UNIT/ML SOPN    Inject 250 Units into the skin every morning. And pen needles 1/day   INSULIN SYRINGE-NEEDLE U-100 (INSULIN SYRINGE .3CC/29GX1/2") 29G X 1/2" 0.3 ML MISC    Inject 1 Syringe 3 (three) times daily as directed. Check blood sugar  three times daily. Dx: E11.9   LISINOPRIL (ZESTRIL) 10 MG TABLET    Take 1 tablet (10 mg total) by mouth at bedtime.   NITROGLYCERIN (NITROSTAT) 0.4 MG SL TABLET    Place 1 tablet (0.4 mg total) under the tongue every 5 (five) minutes as needed for chest pain.   POLYETHYLENE GLYCOL (MIRALAX / GLYCOLAX) 17 G PACKET    Take 17 g by mouth 2 (two) times daily.   SENNA (SENOKOT) 8.6 MG TABS TABLET    Take 1 tablet (8.6 mg total) by mouth daily as needed for mild constipation.   SPIRONOLACTONE (ALDACTONE) 25 MG TABLET    Take 0.5 tablets (12.5 mg total) by mouth daily.  Modified Medications   No medications on file  Discontinued Medications   No medications on file    Physical Exam:  Vitals:   08/28/19 0906  BP: 140/78  Pulse: 85  Temp: (!) 96.9 F (36.1 C)  TempSrc: Temporal  SpO2: 99%  Weight: 163 lb (73.9 kg)  Height: 5' 4"  (1.626 m)   Body mass index is 27.98 kg/m. Wt Readings from Last 3 Encounters:  08/28/19 163 lb (73.9 kg)  08/27/19 157 lb (71.2 kg)  08/26/19 157 lb (71.2 kg)    Physical Exam***  Labs reviewed: Basic Metabolic Panel: Recent Labs    06/17/19 1240 06/17/19 1300 06/17/19 1643 06/17/19 1650 06/27/19 0205 06/28/19 1240 06/28/19 1819 06/29/19 0250 07/13/19 2042 08/20/19 1238  NA 133*  --   --    < > 135  --    < > 134* 141 140  K 4.0  --   --    < > 4.2  --    < > 4.7 4.1 4.1  CL 97*  --   --    < > 99  --    < > 96* 109 106  CO2 22  --   --    < > 25  --    < > 25 23 25   GLUCOSE 254*  --   --    < > 87  --    < > 143* 217* 162*  BUN 21*  --   --    < > 35*  --    < > 26* 10 13  CREATININE 1.65*  --   --    < > 1.17  --    < > 1.00 1.17 1.08  CALCIUM 8.6*  --   --    < > 8.6*  --    < > 8.6* 9.8 9.2  MG 2.4  --   --    < >  2.3 2.6*  --  2.0  --   --   PHOS 3.1  --   --   --   --   --   --   --   --   --   TSH  --  1.854 2.428  --   --   --   --   --   --   --    < > = values in this interval not displayed.   Liver Function Tests: Recent Labs     06/26/19 0200 06/27/19 0205 06/29/19 0250  AST 32 29 23  ALT 26 23 23   ALKPHOS Calvin 63 54  BILITOT 0.5 0.5 0.4  PROT 6.7 6.3* 5.6*  ALBUMIN 2.8* 2.7* 2.6*   Recent Labs    01/23/19 1500  LIPASE 31   No results for input(s): AMMONIA in the last 8760 hours. CBC: Recent Labs    06/28/19 1240 06/29/19 0250 07/13/19 2042  WBC 8.6 11.3* 6.1  NEUTROABS 6.9 8.2* 3.3  HGB 16.3 15.5 14.5  HCT 48.3 44.8 42.9  MCV 89.6 87.0 89.9  PLT 416* 417* 162   Lipid Panel: Recent Labs    04/08/19 0801  CHOL 152  HDL 57  LDLCALC 81  TRIG 70  CHOLHDL 2.7   TSH: Recent Labs    06/17/19 1300 06/17/19 1643  TSH 1.854 2.428   A1C: Lab Results  Component Value Date   HGBA1C 11.9 (H) 06/17/2019     Assessment/Plan There are no diagnoses linked to this encounter.  Next appt: *** Lisaanne Lawrie K. [place], [facility] [phone]

## 2019-08-28 NOTE — Telephone Encounter (Signed)
Please refer to conversation below re: PAP  Below is the conversation between [facility] pharmacist and [name] nurse:  From Ralene Bathe, [place]:  Hi Calvin [name], I am a pharmacist with [facility]. We are trying to find a way to help Mr. Calvin [name] with patient assistance for his insulin. The only insulin program that will accept his application is [employer]. This company makes Basaglar, Humulin / Humalog products. We received a fax from your office today requesting Calvin [name] application forms but unfortunately, this company [employer]) will not accept his application due to him having Partial Extra Help.  He is alrealdy in the coverage gap and insulin co-pays are going to be very expensive. Doubt he will be able to afford [place] moving forward.  Response from Aleatha Borer, LPN:  I do apologize, but please send this information as a telephone encounter so that Dr. Loanne Drilling can address. Thank you

## 2019-08-28 NOTE — Patient Outreach (Signed)
[place] [facility]) Care Management  [place] 08/28/2019  Maxxon Schwanke 10/01/1960 [id number]  Reason for call: Fax received from [name] office stating patient and provider prefer to continue with [place] and request patient assistance program forms for [name] be sent to them.   Call and message left with [name] office to relay message that patient is NOT eligible for Tresiba patient assistance program due to having Partial Extra Help.  [employer] program is only insulin program that accepts patients with Partial Extra Help. [facility] visitor and Humalog / Humulin products.    Patient currently in the coverage gap due to high cost of insulin.  Unclear if he will be able to afford Tresiba copay in coverage gap.    Plan: Message left providing update to provider.  If provider would like to apply for [employer], [facility] is happy to assist.  No other medication assistance options unfortunately.     Ralene Bathe, PharmD, [place] [phone]

## 2019-08-28 NOTE — Patient Instructions (Signed)
Placed pulmonary referral again- please make sure to make and go to appt due to ongoing shortness of breath  You have to eat a proper diet- STOP JUICE and REGULAR SODAS -make sure you are eating enough protein -keep appt with nutritionist later this week

## 2019-08-28 NOTE — Progress Notes (Signed)
Subjective:    Patient ID: Calvin [name], male    DOB: [birth year], 59 y.o.   MRN: [id number]  HPI Pt returns for f/u of diabetes mellitus: DM type: Insulin-requiring type 2. Dx'ed: 4970 Complications: polyneuropathy, CAD, renal insuff, and DR.   Therapy: insulin since soon after dx.  DKA: never Severe hypoglycemia: never.   Pancreatitis: never Pancreatic imaging: normal on 2003 CT. Other: due to noncompliance, he is not a candidate for multiple daily injections; he is retired.   Interval history: pt says cbg's are in the 200's  Pt says he takes the [place] as rx'ed, and he is not close to running out.  No recent steroid rx.   Past Medical History:  Diagnosis Date  . Adenoidal hypertrophy   . Adenomatous colon polyps 2012  . Arthritis   . Diabetes mellitus   . GERD (gastroesophageal reflux disease)   . Heart attack ([place])    [place]  [name] Hx of adenomatous colonic polyps 08/18/2017  . Hyperlipidemia   . Hypertension   . Mild CAD    a. cath in 08/2017 showing mild nonobstructive CAD with scattered 20-30% stenosis.   . Nonischemic cardiomyopathy ([place])    a. EF 20-25% by echo in 09/2017 with cath showing mild CAD. b.  Last echo 12/2017 EF 35-40%, grade 2 DD.  [name] Obesity   . PAF (paroxysmal atrial fibrillation) ([place])   . Sleep apnea    cpap, pt says no longer has    Past Surgical History:  Procedure Laterality Date  . COLONOSCOPY  05/13/11   9 adenomas  . FOREARM SURGERY    . MUSCLE BIOPSY    . RIGHT/LEFT HEART CATH AND CORONARY ANGIOGRAPHY N/A 08/25/2017   Procedure: RIGHT/LEFT HEART CATH AND CORONARY ANGIOGRAPHY;  Surgeon: [name], MD;  Location: [facility];  Service: Cardiovascular;  Laterality: N/A;    Social History   Socioeconomic History  . Marital status: Single    Spouse name: Not on file  . Number of children: 0  . Years of education: Not on file  . Highest education level: Not on file  Occupational History  .  Occupation: Disability  Tobacco Use  . Smoking status: Never Smoker  . Smokeless tobacco: Never Used  Substance and Sexual Activity  . Alcohol use: No  . Drug use: No  . Sexual activity: Yes    Birth control/protection: None  Other Topics Concern  . Not on file  Social History Narrative   Social History   Diet?    Do you drink/eat things with caffeine? yes   Marital status?       single      Do you live in a house, apartment, assisted living, condo, trailer, etc.? yes   Is it one or more stories? One story   How many persons live in your home?   Do you have any pets in your home? (please list)    Highest level of education completed? graduate   Do you exercise?            no                          Type & how often?   Advanced Directives   Do you have a living will? no   Do you have a DNR form?  If not, do you want to discuss one? no   Do you have signed POA/HPOA for forms? no      Functional Status   Do you have difficulty bathing or dressing yourself? no   Do you have difficulty preparing food or eating? no   Do you have difficulty managing your medications? no   Do you have difficulty managing your finances? no   Do you have difficulty affording your medications? no   Social Determinants of Health   Financial Resource Strain:   . Difficulty of Paying Living Expenses: Not on file  Food Insecurity: No Food Insecurity  . Worried About Charity fundraiser in the Last Year: Never true  . Ran Out of Food in the Last Year: Never true  Transportation Needs: Unmet Transportation Needs  . Lack of Transportation (Medical): Yes  . Lack of Transportation (Non-Medical): Yes  Physical Activity:   . Days of Exercise per Week: Not on file  . Minutes of Exercise per Session: Not on file  Stress:   . Feeling of Stress : Not on file  Social Connections:   . Frequency of Communication with Friends and Family: Not on file  . Frequency of Social  Gatherings with Friends and Family: Not on file  . Attends Religious Services: Not on file  . Active Member of Clubs or Organizations: Not on file  . Attends Archivist Meetings: Not on file  . Marital Status: Not on file  Intimate Partner Violence:   . Fear of Current or Ex-Partner: Not on file  . Emotionally Abused: Not on file  . Physically Abused: Not on file  . Sexually Abused: Not on file    Current Outpatient Medications on File Prior to Visit  Medication Sig Dispense Refill  . albuterol (VENTOLIN HFA) 108 (90 Base) MCG/ACT inhaler Inhale 2 puffs into the lungs every 6 (six) hours as needed for wheezing or shortness of breath. 6.7 g 0  . amitriptyline (ELAVIL) 10 MG tablet Take 2 tablets at bed time 180 tablet 3  . apixaban (ELIQUIS) 5 MG TABS tablet Take 5 mg by mouth 2 (two) times daily.    . carvedilol (COREG) 6.25 MG tablet Take 1 tablet (6.25 mg total) by mouth 2 (two) times daily with a meal. 180 tablet 3  . digoxin (LANOXIN) 0.125 MG tablet Take 1 tablet (0.125 mg total) by mouth daily. 90 tablet 3  . donepezil (ARICEPT) 10 MG tablet Take 1 tablet (10 mg total) by mouth at bedtime. 90 tablet 3  . Evolocumab (REPATHA SURECLICK) 440 MG/ML SOAJ Inject 1 pen into the skin every 14 (fourteen) days. 6 pen 3  . ezetimibe (ZETIA) 10 MG tablet Take 1 tablet (10 mg total) by mouth daily. 90 tablet 3  . Insulin Syringe-Needle U-100 (INSULIN SYRINGE .3CC/29GX1/2") 29G X 1/2" 0.3 ML MISC Inject 1 Syringe 3 (three) times daily as directed. Check blood sugar three times daily. Dx: E11.9 100 each 3  . lisinopril (ZESTRIL) 10 MG tablet Take 1 tablet (10 mg total) by mouth at bedtime. 90 tablet 3  . nitroGLYCERIN (NITROSTAT) 0.4 MG SL tablet Place 1 tablet (0.4 mg total) under the tongue every 5 (five) minutes as needed for chest pain. 30 tablet 1  . polyethylene glycol (MIRALAX / GLYCOLAX) 17 g packet Take 17 g by mouth 2 (two) times daily. 14 each 0  . senna (SENOKOT) 8.6 MG TABS  tablet Take 1 tablet (8.6 mg total) by mouth daily as  needed for mild constipation. 15 tablet 0  . spironolactone (ALDACTONE) 25 MG tablet Take 0.5 tablets (12.5 mg total) by mouth daily. 90 tablet 3   No current facility-administered medications on file prior to visit.    No Known Allergies  Family History  Problem Relation Age of Onset  . Heart disease Mother   . Heart attack Mother 22  . Hypertension Mother   . Hyperlipidemia Mother   . Diabetes Mother   . Heart disease Father   . Heart attack Father 27  . Hypertension Father   . Hyperlipidemia Father   . Diabetes Father   . Heart attack Sister 15  . Colon cancer Neg Hx   . Stomach cancer Neg Hx   . Esophageal cancer Neg Hx   . Rectal cancer Neg Hx     BP 124/70 (BP Location: Left Arm, Patient Position: Sitting, Cuff Size: Normal)   Pulse 82   Ht 5' 4"  (1.626 m)   Wt 163 lb (73.9 kg)   SpO2 98%   BMI 27.98 kg/m    Review of Systems He denies hypoglycemia    Objective:   Physical Exam VITAL SIGNS:  See vs page GENERAL: no distress Pulses: dorsalis pedis intact bilat.   MSK: no deformity of the feet CV: no leg edema Skin:  no ulcer on the feet.  normal color and temp on the feet. Neuro: sensation is intact to touch on the feet.     Lab Results  Component Value Date   HGBA1C 9.0 (A) 08/28/2019       Assessment & Plan:  Insulin-requiring type 2 DM, with DR: he needs increased rx   Patient Instructions  Please increase the Tresiba to 280 units daily. I have sent a prescription to your pharmacy, for a blood sugar monitor. check your blood sugar twice a day.  vary the time of day when you check, between before the 3 meals, and at bedtime.  also check if you have symptoms of your blood sugar being too high or too low.  please keep a record of the readings and bring it to your next appointment here (or you can bring the meter itself).  You can write it on any piece of paper.  please call us sooner if your  blood sugar goes below 70, or if you have a lot of readings over 200. Please come back for a follow-up appointment in 6 weeks.

## 2019-08-28 NOTE — Telephone Encounter (Signed)
Ok, i'll sign the [place] form.  Pt can use up his [name] first.  Skip a day before starting basaglar

## 2019-08-28 NOTE — Telephone Encounter (Signed)
[facility] Pharmacist is calling about switching this patientto  [place] based products in order for him to qualify for patient assistance.  Patient is not able to afford the [place].  [place] products are Basaglar, Humulin, Humalog, or a combo of these products etc   Please contact [place] at [phone] with any questions

## 2019-08-28 NOTE — Telephone Encounter (Signed)
Message sent to [facility] with [facility] asking for [place] PAP to be re-faxed to our office for completion.

## 2019-08-28 NOTE — Progress Notes (Signed)
Careteam: [name], NP as PCP - General (Geriatric Medicine) [name], MD as PCP - Cardiology (Cardiology) [name], [name], MD as PCP - Advanced Heart Failure (Cardiology) [name], MD as Consulting Physician (Endocrinology) Rudean Haskell, [facility] as [place] Management (Pharmacist) [name], RN as [place] Management Adaline Sill, CPhT as [employer], corporate)  Advanced Directive information Does Patient Have a Catering manager?: No, Would patient like information on creating a medical advance directive?: Yes (MAU/Ambulatory/Procedural Areas - Information given)(Given paperwork at previous visit)  No Known Allergies  Chief Complaint  Patient presents with  . Follow-up    6 week follow-up. Moderate fall risk      HPI: Patient is a 59 y.o. male is here today for 6 week follow-up. Pt reports ongoing shortness of breath and fatigue that has been persistent since having COVID in December 2020. Patient reports that he has been sleeping more frequently. When he gets frustrated his symptoms worsen and it brings on cough. Can't do the things that he used to do.  Right sided weakness persists, has not followed up with neurology regarding this.  DM - Blood sugars have been high in the 200s, reports he is taking his medications daily. Has appt with endocrinologist after he leaves here today.  He reports some days he does not have an appetite. He also reports drinking juice and soda frequently. He is seeing a dietician on Friday.  Reports a fluctuation in body temperature - has hot flashes and chills on and off.   Headaches - comes and goes. Saw neurology regarding this yesterday.   Constipation - has improved. Taking Miralax daily.   Shortness of breath- ongoing. This has been going on since before COVID but he never made pulmonary appt  after referral was made. Cardiology does not feel like this is cardiac related. He does not have increase in shortness of breath with activity.  Patient lives with people that smoke frequently. He smells heavily of smoke today. Reports that he does not smoke.   Review of Systems:  Review of Systems  Constitutional: Positive for malaise/fatigue. Negative for fever and weight loss.  HENT: Negative.   Respiratory: Positive for shortness of breath.   Cardiovascular: Negative for chest pain and leg swelling.  Gastrointestinal: Positive for constipation. Negative for abdominal pain.  Genitourinary: Negative.   Musculoskeletal: Negative.   Skin: Negative.   Neurological: Positive for headaches.  Psychiatric/Behavioral: Positive for memory loss.    Past Medical History:  Diagnosis Date  . Adenoidal hypertrophy   . Adenomatous colon polyps 2012  . Arthritis   . Diabetes mellitus   . GERD (gastroesophageal reflux disease)   . Heart attack ([place])    While living in [place]  [name] Hx of adenomatous colonic polyps 08/18/2017  . Hyperlipidemia   . Hypertension   . Mild CAD    a. cath in 08/2017 showing mild nonobstructive CAD with scattered 20-30% stenosis.   . Nonischemic cardiomyopathy ([place])    a. EF 20-25% by echo in 09/2017 with cath showing mild CAD. b.  Last echo 12/2017 EF 35-40%, grade 2 DD.  [name] Obesity   . PAF (paroxysmal atrial fibrillation) ([place])   . Sleep apnea    cpap, pt says no longer has   Past Surgical History:  Procedure Laterality Date  . COLONOSCOPY  05/13/11   9 adenomas  . FOREARM SURGERY    .  MUSCLE BIOPSY    . RIGHT/LEFT HEART CATH AND CORONARY ANGIOGRAPHY N/A 08/25/2017   Procedure: RIGHT/LEFT HEART CATH AND CORONARY ANGIOGRAPHY;  Surgeon: [name], MD;  Location: [facility];  Service: Cardiovascular;  Laterality: N/A;   Social History:   reports that he has never smoked. He has never used smokeless tobacco. He reports that he does not drink  alcohol or use drugs.  Family History  Problem Relation Age of Onset  . Heart disease Mother   . Heart attack Mother 77  . Hypertension Mother   . Hyperlipidemia Mother   . Diabetes Mother   . Heart disease Father   . Heart attack Father 65  . Hypertension Father   . Hyperlipidemia Father   . Diabetes Father   . Heart attack Sister 66  . Colon cancer Neg Hx   . Stomach cancer Neg Hx   . Esophageal cancer Neg Hx   . Rectal cancer Neg Hx     Medications: Patient's Medications  New Prescriptions   No medications on file  Previous Medications   ALBUTEROL (VENTOLIN HFA) 108 (90 BASE) MCG/ACT INHALER    Inhale 2 puffs into the lungs every 6 (six) hours as needed for wheezing or shortness of breath.   AMITRIPTYLINE (ELAVIL) 10 MG TABLET    Take 2 tablets at bed time   APIXABAN (ELIQUIS) 5 MG TABS TABLET    Take 5 mg by mouth 2 (two) times daily.   CARVEDILOL (COREG) 6.25 MG TABLET    Take 1 tablet (6.25 mg total) by mouth 2 (two) times daily with a meal.   DIGOXIN (LANOXIN) 0.125 MG TABLET    Take 1 tablet (0.125 mg total) by mouth daily.   DONEPEZIL (ARICEPT) 10 MG TABLET    Take 1 tablet (10 mg total) by mouth at bedtime.   EVOLOCUMAB (REPATHA SURECLICK) 967 MG/ML SOAJ    Inject 1 pen into the skin every 14 (fourteen) days.   EZETIMIBE (ZETIA) 10 MG TABLET    Take 1 tablet (10 mg total) by mouth daily.   GLUCOSE BLOOD (CONTOUR TEST) TEST STRIP    1 each by Other route 2 (two) times a day.   INSULIN GLARGINE, 2 UNIT DIAL, (TOUJEO MAX SOLOSTAR) 300 UNIT/ML SOPN    Inject 250 Units into the skin every morning. And pen needles 1/day   INSULIN SYRINGE-NEEDLE U-100 (INSULIN SYRINGE .3CC/29GX1/2") 29G X 1/2" 0.3 ML MISC    Inject 1 Syringe 3 (three) times daily as directed. Check blood sugar three times daily. Dx: E11.9   LISINOPRIL (ZESTRIL) 10 MG TABLET    Take 1 tablet (10 mg total) by mouth at bedtime.   NITROGLYCERIN (NITROSTAT) 0.4 MG SL TABLET    Place 1 tablet (0.4 mg total) under  the tongue every 5 (five) minutes as needed for chest pain.   POLYETHYLENE GLYCOL (MIRALAX / GLYCOLAX) 17 G PACKET    Take 17 g by mouth 2 (two) times daily.   SENNA (SENOKOT) 8.6 MG TABS TABLET    Take 1 tablet (8.6 mg total) by mouth daily as needed for mild constipation.   SPIRONOLACTONE (ALDACTONE) 25 MG TABLET    Take 0.5 tablets (12.5 mg total) by mouth daily.  Modified Medications   No medications on file  Discontinued Medications   No medications on file    Physical Exam:  Vitals:   08/28/19 0906  BP: 140/78  Pulse: 85  Temp: (!) 96.9 F (36.1 C)  TempSrc:  Temporal  SpO2: 99%  Weight: 163 lb (73.9 kg)  Height: 5' 4"  (1.626 m)   Body mass index is 27.98 kg/m. Wt Readings from Last 3 Encounters:  08/28/19 163 lb (73.9 kg)  08/27/19 157 lb (71.2 kg)  08/26/19 157 lb (71.2 kg)    Physical Exam Constitutional:      Appearance: Normal appearance.  HENT:     Head: Normocephalic and atraumatic.  Cardiovascular:     Rate and Rhythm: Normal rate and regular rhythm.  Pulmonary:     Effort: Pulmonary effort is normal.     Breath sounds: Normal breath sounds.  Abdominal:     General: Bowel sounds are normal.     Palpations: Abdomen is soft.  Musculoskeletal:        General: Normal range of motion.     Cervical back: Normal range of motion and neck supple.  Neurological:     Mental Status: He is alert.     Labs reviewed: Basic Metabolic Panel: Recent Labs    06/17/19 1240 06/17/19 1300 06/17/19 1643 06/17/19 1650 06/27/19 0205 06/28/19 1240 06/28/19 1819 06/29/19 0250 07/13/19 2042 08/20/19 1238  NA 133*  --   --    < > 135  --    < > 134* 141 140  K 4.0  --   --    < > 4.2  --    < > 4.7 4.1 4.1  CL 97*  --   --    < > 99  --    < > 96* 109 106  CO2 22  --   --    < > 25  --    < > 25 23 25   GLUCOSE 254*  --   --    < > 87  --    < > 143* 217* 162*  BUN 21*  --   --    < > 35*  --    < > 26* 10 13  CREATININE 1.65*  --   --    < > 1.17  --    < >  1.00 1.17 1.08  CALCIUM 8.6*  --   --    < > 8.6*  --    < > 8.6* 9.8 9.2  MG 2.4  --   --    < > 2.3 2.6*  --  2.0  --   --   PHOS 3.1  --   --   --   --   --   --   --   --   --   TSH  --  1.854 2.428  --   --   --   --   --   --   --    < > = values in this interval not displayed.   Liver Function Tests: Recent Labs    06/26/19 0200 06/27/19 0205 06/29/19 0250  AST 32 29 23  ALT 26 23 23   ALKPHOS 66 63 54  BILITOT 0.5 0.5 0.4  PROT 6.7 6.3* 5.6*  ALBUMIN 2.8* 2.7* 2.6*   Recent Labs    01/23/19 1500  LIPASE 31   No results for input(s): AMMONIA in the last 8760 hours. CBC: Recent Labs    06/28/19 1240 06/29/19 0250 07/13/19 2042  WBC 8.6 11.3* 6.1  NEUTROABS 6.9 8.2* 3.3  HGB 16.3 15.5 14.5  HCT 48.3 44.8 42.9  MCV 89.6 87.0 89.9  PLT 416* 417* 162   Lipid Panel:  Recent Labs    04/08/19 0801  CHOL 152  HDL 57  LDLCALC 81  TRIG 70  CHOLHDL 2.7   TSH: Recent Labs    06/17/19 1300 06/17/19 1643  TSH 1.854 2.428   A1C: Lab Results  Component Value Date   HGBA1C 11.9 (H) 06/17/2019     Assessment/Plan 1. COVID-19 virus detected Continues to be debilitated from having COVID infection. He is still very fatigued with increased shortness of breath. He has home health services with [facility] and family support. He has a history of shortness of breath that has since worsened.  2. MCI (mild cognitive impairment) Followed by neurology. Has help at home with medications and appointment reminders. He has [facility] helping with appts and medication at this time.  3. Type 2 diabetes mellitus with diabetic neuropathy, with long-term current use of insulin ([place]) Following up with endocrinologist today and nutritionist on Friday. Advised on the importance of avoiding high sugar drinks such as juice and sodas.   4. Essential hypertension Stable - continue on current regimen.   5. Shortness of breath -ongoing, O2 maintains with walking in the hallways - CBC with  Differential/Platelet - COMPLETE METABOLIC PANEL WITH GFR - Ambulatory referral to Pulmonology  6. Other constipation Continue taking Miralax. Advised to increase water intake and the importance of controlling blood sugar.   7. Other fatigue -likely due to post covid but also uncontrolled blood sugar could play a role, will also get labs to evaluate and make sure he is not anemic or other cause. - CBC with Differential/Platelet - COMPLETE METABOLIC PANEL WITH GFR   Next appt: follow-up in 6 weeks Dianca Owensby K. Harle Battiest I personally was present during the history, physical exam and medical decision-making activities of this service and have verified that the service and findings are accurately documented in the student's note [facility] [phone]

## 2019-08-29 ENCOUNTER — Other Ambulatory Visit: Payer: Self-pay

## 2019-08-29 DIAGNOSIS — E1140 Type 2 diabetes mellitus with diabetic neuropathy, unspecified: Secondary | ICD-10-CM

## 2019-08-29 MED ORDER — ONETOUCH DELICA PLUS LANCET30G MISC: 1.0000 | Freq: Two times a day (BID) | 0 refills | Status: DC

## 2019-08-29 MED ORDER — ONETOUCH ULTRA VI STRP: 1.0000 | ORAL_STRIP | Freq: Two times a day (BID) | 0 refills | Status: DC

## 2019-08-29 NOTE — Telephone Encounter (Signed)
From ColleenFran Lowes, I'm off tomorrow but wanted to include you on this message. Can you please re-fax [employer] forms to [name] office? Thanks so much!  Will await forms for completion

## 2019-08-30 ENCOUNTER — Encounter: Payer: Medicare Other | Attending: Nurse Practitioner | Admitting: Registered"

## 2019-08-30 ENCOUNTER — Telehealth: Payer: Self-pay

## 2019-08-30 NOTE — Telephone Encounter (Signed)
Company: [employer] for [employer] Other records requested: None requested  All above requested information has been faxed successfully to [employer] listed above. Documents and fax confirmation have been placed in the faxed file for future reference.

## 2019-09-03 ENCOUNTER — Other Ambulatory Visit (HOSPITAL_COMMUNITY): Payer: Self-pay

## 2019-09-03 ENCOUNTER — Other Ambulatory Visit: Payer: Self-pay

## 2019-09-03 ENCOUNTER — Ambulatory Visit (HOSPITAL_COMMUNITY)
Admission: RE | Admit: 2019-09-03 | Discharge: 2019-09-03 | Disposition: A | Discharge status: Home / Self Care | Payer: Medicare Other | Source: Ambulatory Visit | Attending: Cardiology | Admitting: Cardiology

## 2019-09-03 DIAGNOSIS — I5022 Chronic systolic (congestive) heart failure: Secondary | ICD-10-CM | POA: Insufficient documentation

## 2019-09-03 DIAGNOSIS — R079 Chest pain, unspecified: Secondary | ICD-10-CM

## 2019-09-03 LAB — BASIC METABOLIC PANEL
Anion gap: 7 (ref 5–15)
BUN: 6 mg/dL (ref 6–20)
CO2: 30 mmol/L (ref 22–32)
Calcium: 8.7 mg/dL — ABNORMAL LOW (ref 8.9–10.3)
Chloride: 102 mmol/L (ref 98–111)
Creatinine, Ser: 1.05 mg/dL (ref 0.61–1.24)
GFR calc Af Amer: >60 mL/min (ref >60)
GFR calc non Af Amer: >60 mL/min (ref >60)
Glucose, Bld: 215 mg/dL — ABNORMAL HIGH (ref 70–99)
Potassium: 3.4 mmol/L — ABNORMAL LOW (ref 3.5–5.1)
Sodium: 139 mmol/L (ref 135–145)

## 2019-09-03 LAB — DIGOXIN LEVEL: Digoxin Level: 0.3 ng/mL — ABNORMAL LOW (ref 0.8–2.0)

## 2019-09-03 NOTE — Addendum Note (Signed)
Addended by: Valeda Malm on: 09/03/2019 10:01 AM   Modules accepted: Orders

## 2019-09-04 ENCOUNTER — Telehealth (HOSPITAL_COMMUNITY): Payer: Self-pay

## 2019-09-04 MED ORDER — SPIRONOLACTONE 25 MG PO TABS: 25.0000 mg | ORAL_TABLET | Freq: Every day | ORAL | 3 refills | Status: DC

## 2019-09-04 NOTE — Telephone Encounter (Signed)
-----   Message from Consuelo Pandy, [place] sent at 09/03/2019  4:02 PM EST ----- Scr stable. K low at 3.4. increase spiro to 25 mg daily. Notify paramedicine of change.

## 2019-09-04 NOTE — Telephone Encounter (Signed)
Error

## 2019-09-04 NOTE — Telephone Encounter (Signed)
Spoke with Joellen Jersey EMT-P. Joellen Jersey will facilitate med changes. New script sent in. No further questions.

## 2019-09-05 ENCOUNTER — Other Ambulatory Visit (HOSPITAL_COMMUNITY): Payer: Self-pay

## 2019-09-05 ENCOUNTER — Ambulatory Visit: Payer: Self-pay | Admitting: Pharmacist

## 2019-09-06 ENCOUNTER — Ambulatory Visit: Payer: Self-pay | Admitting: Pharmacist

## 2019-09-10 NOTE — Progress Notes (Signed)
Arrived for out appointment time, pts family member met me outside stating he left several hours ago and not sure when he will return.  Will try to see him next week.   Marylouise Stacks, EMT-Paramedic  09/10/19

## 2019-09-11 ENCOUNTER — Ambulatory Visit: Payer: Self-pay | Admitting: Pharmacist

## 2019-09-12 ENCOUNTER — Other Ambulatory Visit (HOSPITAL_COMMUNITY): Payer: Self-pay

## 2019-09-12 NOTE — Progress Notes (Signed)
Paramedicine Encounter    Patient ID: Calvin [name], male    DOB: Apr 10, 1961, 59 y.o.   MRN: [id number]   [name], NP as PCP - General (Geriatric Medicine) [name], MD as PCP - Cardiology (Cardiology) [name], [name], MD as PCP - Advanced Heart Failure (Cardiology) [name], MD as Consulting Physician (Endocrinology) Rudean Haskell, [facility] as [place] Management (Pharmacist) [name], RN as [place] Management Adaline Sill, CPhT as [employer] banker, corporate)  Patient Active Problem List   Diagnosis Date Noted  . COVID-19 virus detected 06/18/2019  . Right sided weakness 06/17/2019  . AKI (acute kidney injury) ([place]) 06/17/2019  . Foot pain, bilateral 02/01/2019  . Abdominal pain 01/23/2019  . Chest pain 01/23/2019  . Right leg pain 12/20/2018  . Overgrown toenails 12/20/2018  . Unsteady gait 12/20/2018  . Encounter for therapeutic drug monitoring 10/22/2018  . Nausea and vomiting 03/10/2018  . AF (paroxysmal atrial fibrillation) ([place]) 03/10/2018  . Non-cardiac chest pain 03/09/2018  . Diabetic neuropathy ([place]) 09/26/2017  . MCI (mild cognitive impairment) 06/23/2017  . OSA on CPAP 05/10/2017  . Gastroesophageal reflux disease 05/10/2017  . Insomnia 05/10/2017  . Onychomycosis of multiple toenails with type 2 diabetes mellitus and peripheral neuropathy ([place]) 05/10/2017  . Chronic systolic heart failure ([place]) 11/22/2015  . Essential hypertension 11/22/2015  . DM (diabetes mellitus) ([place]) 11/20/2015  . HLD (hyperlipidemia) 11/20/2015  . Hx of adenomatous colonic polyps 05/19/2011    Current Outpatient Medications:  .  albuterol (VENTOLIN HFA) 108 (90 Base) MCG/ACT inhaler, Inhale 2 puffs into the lungs every 6 (six) hours as needed for wheezing or shortness of breath., Disp: 6.7 g, Rfl: 0 .  amitriptyline (ELAVIL) 10 MG tablet, Take  2 tablets at bed time, Disp: 180 tablet, Rfl: 3 .  apixaban (ELIQUIS) 5 MG TABS tablet, Take 5 mg by mouth 2 (two) times daily., Disp: , Rfl:  .  carvedilol (COREG) 6.25 MG tablet, Take 1 tablet (6.25 mg total) by mouth 2 (two) times daily with a meal., Disp: 180 tablet, Rfl: 3 .  digoxin (LANOXIN) 0.125 MG tablet, Take 1 tablet (0.125 mg total) by mouth daily., Disp: 90 tablet, Rfl: 3 .  donepezil (ARICEPT) 10 MG tablet, Take 1 tablet (10 mg total) by mouth at bedtime., Disp: 90 tablet, Rfl: 3 .  Evolocumab (REPATHA SURECLICK) 448 MG/ML SOAJ, Inject 1 pen into the skin every 14 (fourteen) days., Disp: 6 pen, Rfl: 3 .  ezetimibe (ZETIA) 10 MG tablet, Take 1 tablet (10 mg total) by mouth daily., Disp: 90 tablet, Rfl: 3 .  glucose blood (ONETOUCH ULTRA) test strip, 1 each by Other route 2 (two) times daily. E11.9, Disp: 180 each, Rfl: 0 .  Insulin Glargine (BASAGLAR KWIKPEN) 100 UNIT/ML SOPN, Inject 2.8 mLs (280 Units total) into the skin every morning., Disp: 35 pen, Rfl: 11 .  Insulin Syringe-Needle U-100 (INSULIN SYRINGE .3CC/29GX1/2") 29G X 1/2" 0.3 ML MISC, Inject 1 Syringe 3 (three) times daily as directed. Check blood sugar three times daily. Dx: E11.9, Disp: 100 each, Rfl: 3 .  Lancets (ONETOUCH DELICA PLUS [id number]) MISC, 1 each by Does not apply route 2 (two) times daily. E11.9, Disp: 180 each, Rfl: 0 .  lisinopril (ZESTRIL) 10 MG tablet, Take 1 tablet (10 mg total) by mouth at bedtime., Disp: 90 tablet, Rfl: 3 .  polyethylene glycol (MIRALAX / GLYCOLAX) 17 g packet, Take 17  g by mouth 2 (two) times daily., Disp: 14 each, Rfl: 0 .  senna (SENOKOT) 8.6 MG TABS tablet, Take 1 tablet (8.6 mg total) by mouth daily as needed for mild constipation., Disp: 15 tablet, Rfl: 0 .  spironolactone (ALDACTONE) 25 MG tablet, Take 1 tablet (25 mg total) by mouth daily., Disp: 90 tablet, Rfl: 3 .  nitroGLYCERIN (NITROSTAT) 0.4 MG SL tablet, Place 1 tablet (0.4 mg total) under the tongue every 5 (five) minutes  as needed for chest pain. (Patient not taking: Reported on 09/12/2019), Disp: 30 tablet, Rfl: 1 No Known Allergies    Social History   Socioeconomic History  . Marital status: Single    Spouse name: Not on file  . Number of children: 0  . Years of education: Not on file  . Highest education level: Not on file  Occupational History  . Occupation: Disability  Tobacco Use  . Smoking status: Never Smoker  . Smokeless tobacco: Never Used  Substance and Sexual Activity  . Alcohol use: No  . Drug use: No  . Sexual activity: Yes    Birth control/protection: None  Other Topics Concern  . Not on file  Social History Narrative   Social History   Diet?    Do you drink/eat things with caffeine? yes   Marital status?       single      Do you live in a house, apartment, assisted living, condo, trailer, etc.? yes   Is it one or more stories? One story   How many persons live in your home?   Do you have any pets in your home? (please list)    Highest level of education completed? graduate   Do you exercise?            no                          Type & how often?   Advanced Directives   Do you have a living will? no   Do you have a DNR form?                                  If not, do you want to discuss one? no   Do you have signed POA/HPOA for forms? no      Functional Status   Do you have difficulty bathing or dressing yourself? no   Do you have difficulty preparing food or eating? no   Do you have difficulty managing your medications? no   Do you have difficulty managing your finances? no   Do you have difficulty affording your medications? no   Social Determinants of Health   Financial Resource Strain:   . Difficulty of Paying Living Expenses:   Food Insecurity: No Food Insecurity  . Worried About Charity fundraiser in the Last Year: Never true  . Ran Out of Food in the Last Year: Never true  Transportation Needs: Unmet Transportation Needs  . Lack of Transportation  (Medical): Yes  . Lack of Transportation (Non-Medical): Yes  Physical Activity:   . Days of Exercise per Week:   . Minutes of Exercise per Session:   Stress:   . Feeling of Stress :   Social Connections:   . Frequency of Communication with Friends and Family:   . Frequency of Social Gatherings with Friends and Family:   .  Attends Religious Services:   . Active Member of Clubs or Organizations:   . Attends Archivist Meetings:   [name] Marital Status:   Intimate Partner Violence:   . Fear of Current or Ex-Partner:   . Emotionally Abused:   [name] Physically Abused:   . Sexually Abused:     Physical Exam      Future Appointments  Date Time Provider [place]  09/23/2019 10:30 AM [name], RN [facility] None  09/27/2019  2:00 PM [place] ECHO OP 1 [facility]  09/27/2019  3:00 PM [name], [name], MD [facility] None  10/09/2019  9:15 AM [name], MD [facility] None  10/09/2019 10:30 AM [name], NP [facility] None  03/18/2020 11:30 AM Cameron Sprang, MD [facility] None    BP 138/74   Pulse 90   Temp 98.2 F (36.8 C)   Resp 18   Wt 162 lb (73.5 kg)   SpO2 99%   BMI 27.81 kg/m  CBG EMS-365  Weight yesterday-??  Last visit weight-157  Pt reports he is doing ok, he wakes up tired every morning, sleeps good--this is likely leftover effects from COVID. last week he forgot about our appointment so it was missed. He denied missing any of his meds.    He has enough insulin to get him through next week.  He states he has sharp c/p when he takes a deep breath or moves around too much. Lungs clear. No edema noted. Appetite comes and goes.  Pt states he is light headed a lot and h/a. The med did help at first but now it is not helping as much.  meds verified and pill box refilled  --denied for extra help-got letter in [place] relay that to colleen.  -got new glucometer-checked his CBG as noted, reviewed with him on how to use it.    Marylouise Stacks,  [facility] Paramedic  09/12/19

## 2019-09-19 ENCOUNTER — Other Ambulatory Visit (HOSPITAL_COMMUNITY): Payer: Self-pay

## 2019-09-19 NOTE — Progress Notes (Signed)
Paramedicine Encounter    Patient ID: Calvin [name], male    DOB: Jun 13, 1961, 59 y.o.   MRN: [id number]   [name], NP as PCP - General (Geriatric Medicine) [name], MD as PCP - Cardiology (Cardiology) [name], [name], MD as PCP - Advanced Heart Failure (Cardiology) [name], MD as Consulting Physician (Endocrinology) Rudean Haskell, [facility] as [place] Management (Pharmacist) [name], RN as [place] Management Adaline Sill, CPhT as [employer], corporate)  Patient Active Problem List   Diagnosis Date Noted  . COVID-19 virus detected 06/18/2019  . Right sided weakness 06/17/2019  . AKI (acute kidney injury) ([place]) 06/17/2019  . Foot pain, bilateral 02/01/2019  . Abdominal pain 01/23/2019  . Chest pain 01/23/2019  . Right leg pain 12/20/2018  . Overgrown toenails 12/20/2018  . Unsteady gait 12/20/2018  . Encounter for therapeutic drug monitoring 10/22/2018  . Nausea and vomiting 03/10/2018  . AF (paroxysmal atrial fibrillation) ([place]) 03/10/2018  . Non-cardiac chest pain 03/09/2018  . Diabetic neuropathy ([place]) 09/26/2017  . MCI (mild cognitive impairment) 06/23/2017  . OSA on CPAP 05/10/2017  . Gastroesophageal reflux disease 05/10/2017  . Insomnia 05/10/2017  . Onychomycosis of multiple toenails with type 2 diabetes mellitus and peripheral neuropathy ([place]) 05/10/2017  . Chronic systolic heart failure ([place]) 11/22/2015  . Essential hypertension 11/22/2015  . DM (diabetes mellitus) ([place]) 11/20/2015  . HLD (hyperlipidemia) 11/20/2015  . Hx of adenomatous colonic polyps 05/19/2011    Current Outpatient Medications:  .  albuterol (VENTOLIN HFA) 108 (90 Base) MCG/ACT inhaler, Inhale 2 puffs into the lungs every 6 (six) hours as needed for wheezing or shortness of breath., Disp: 6.7 g, Rfl: 0 .  amitriptyline (ELAVIL) 10 MG tablet, Take  2 tablets at bed time, Disp: 180 tablet, Rfl: 3 .  apixaban (ELIQUIS) 5 MG TABS tablet, Take 5 mg by mouth 2 (two) times daily., Disp: , Rfl:  .  carvedilol (COREG) 6.25 MG tablet, Take 1 tablet (6.25 mg total) by mouth 2 (two) times daily with a meal., Disp: 180 tablet, Rfl: 3 .  digoxin (LANOXIN) 0.125 MG tablet, Take 1 tablet (0.125 mg total) by mouth daily., Disp: 90 tablet, Rfl: 3 .  donepezil (ARICEPT) 10 MG tablet, Take 1 tablet (10 mg total) by mouth at bedtime., Disp: 90 tablet, Rfl: 3 .  Evolocumab (REPATHA SURECLICK) 619 MG/ML SOAJ, Inject 1 pen into the skin every 14 (fourteen) days., Disp: 6 pen, Rfl: 3 .  ezetimibe (ZETIA) 10 MG tablet, Take 1 tablet (10 mg total) by mouth daily., Disp: 90 tablet, Rfl: 3 .  glucose blood (ONETOUCH ULTRA) test strip, 1 each by Other route 2 (two) times daily. E11.9, Disp: 180 each, Rfl: 0 .  Insulin Glargine (BASAGLAR KWIKPEN) 100 UNIT/ML SOPN, Inject 2.8 mLs (280 Units total) into the skin every morning., Disp: 35 pen, Rfl: 11 .  Insulin Syringe-Needle U-100 (INSULIN SYRINGE .3CC/29GX1/2") 29G X 1/2" 0.3 ML MISC, Inject 1 Syringe 3 (three) times daily as directed. Check blood sugar three times daily. Dx: E11.9, Disp: 100 each, Rfl: 3 .  Lancets (ONETOUCH DELICA PLUS JKDTOI71I) MISC, 1 each by Does not apply route 2 (two) times daily. E11.9, Disp: 180 each, Rfl: 0 .  lisinopril (ZESTRIL) 10 MG tablet, Take 1 tablet (10 mg total) by mouth at bedtime., Disp: 90 tablet, Rfl: 3 .  polyethylene glycol (MIRALAX / GLYCOLAX) 17 g packet, Take 17  g by mouth 2 (two) times daily., Disp: 14 each, Rfl: 0 .  senna (SENOKOT) 8.6 MG TABS tablet, Take 1 tablet (8.6 mg total) by mouth daily as needed for mild constipation., Disp: 15 tablet, Rfl: 0 .  spironolactone (ALDACTONE) 25 MG tablet, Take 1 tablet (25 mg total) by mouth daily., Disp: 90 tablet, Rfl: 3 .  nitroGLYCERIN (NITROSTAT) 0.4 MG SL tablet, Place 1 tablet (0.4 mg total) under the tongue every 5 (five) minutes  as needed for chest pain. (Patient not taking: Reported on 09/12/2019), Disp: 30 tablet, Rfl: 1 No Known Allergies    Social History   Socioeconomic History  . Marital status: Single    Spouse name: Not on file  . Number of children: 0  . Years of education: Not on file  . Highest education level: Not on file  Occupational History  . Occupation: Disability  Tobacco Use  . Smoking status: Never Smoker  . Smokeless tobacco: Never Used  Substance and Sexual Activity  . Alcohol use: No  . Drug use: No  . Sexual activity: Yes    Birth control/protection: None  Other Topics Concern  . Not on file  Social History Narrative   Social History   Diet?    Do you drink/eat things with caffeine? yes   Marital status?       single      Do you live in a house, apartment, assisted living, condo, trailer, etc.? yes   Is it one or more stories? One story   How many persons live in your home?   Do you have any pets in your home? (please list)    Highest level of education completed? graduate   Do you exercise?            no                          Type & how often?   Advanced Directives   Do you have a living will? no   Do you have a DNR form?                                  If not, do you want to discuss one? no   Do you have signed POA/HPOA for forms? no      Functional Status   Do you have difficulty bathing or dressing yourself? no   Do you have difficulty preparing food or eating? no   Do you have difficulty managing your medications? no   Do you have difficulty managing your finances? no   Do you have difficulty affording your medications? no   Social Determinants of Health   Financial Resource Strain:   . Difficulty of Paying Living Expenses:   Food Insecurity: No Food Insecurity  . Worried About Charity fundraiser in the Last Year: Never true  . Ran Out of Food in the Last Year: Never true  Transportation Needs: Unmet Transportation Needs  . Lack of Transportation  (Medical): Yes  . Lack of Transportation (Non-Medical): Yes  Physical Activity:   . Days of Exercise per Week:   . Minutes of Exercise per Session:   Stress:   . Feeling of Stress :   Social Connections:   . Frequency of Communication with Friends and Family:   . Frequency of Social Gatherings with Friends and Family:   .  Attends Religious Services:   . Active Member of Clubs or Organizations:   . Attends Archivist Meetings:   [name] Marital Status:   Intimate Partner Violence:   . Fear of Current or Ex-Partner:   . Emotionally Abused:   [name] Physically Abused:   . Sexually Abused:     Physical Exam      Future Appointments  Date Time Provider [place]  09/23/2019 10:30 AM [name], RN THN-COM None  09/27/2019  2:00 PM [place] ECHO OP 1 MC-ECHOLAB [facility]  09/27/2019  3:00 PM [name], [name], MD MC-HVSC None  10/09/2019  9:15 AM [name], MD LBPC-LBENDO None  10/09/2019 10:30 AM [name], NP PSC-PSC None  10/14/2019 12:15 PM [place] PEC-PEC PEC  03/18/2020 11:30 AM Cameron Sprang, MD LBN-LBNG None    BP 136/78   Pulse 98   Temp 97.6 F (36.4 C)   Resp 16   Wt 158 lb (71.7 kg)   SpO2 99%   BMI 27.12 kg/m  CBG EMS- Weight yesterday-157 Last visit weight-162  Pt reports he is feeling the same for most part, his breathing is improving but he does still get tired easily.  his CBG's are running elevated. Pt is eating a cook out tray with a milkshake when I arrived. We have discussed his diet before and things that he should avoid. He said he knows he shouldn't have ate that but he feels like his appetite hasnt been good since covid so he took advantage of being hungry so he ate.   No missed doses of his meds. meds verified and pill box refilled.  He found the social security benefits paper so that info was relayed to [facility] for his insulin assistance.  He does get $148 withdrawn from his check for insurance premium. He did not  qualify for the extra help due to him not sending in the benefits paperwork.   He needs his insulin refilled-pharmacy's phone line was not working and couldn't get through. Pt will try again later on.  I sent message to his doc office to see if they have any samples to give him as I believe the cost of the insulin is expensive.  hes got a few more days left of insulin.  No c/p, no edema noted, no dizziness.   Marylouise Stacks, [place] [facility] Paramedic  09/19/19

## 2019-09-23 ENCOUNTER — Other Ambulatory Visit: Payer: Self-pay | Admitting: *Deleted

## 2019-09-23 NOTE — Patient Outreach (Signed)
[place] [facility]) Care Management  09/23/2019  Raydan Schlabach 26-Sep-1960 [id number]   Call placed to member to follow up on diabetes management and continued recovery from Covid infection.  He report he has continued to feel tired with little energy.  Denies having any ongoing shortness of breath, cough or fever.  He has had intermittent chest pain, cardiologist aware.  Appointment scheduled for echocardiogram on 3/26, will have appointment at heart failure clinic following test.  His last visit with Paramedicine/EMT was last week, state he will have another visit this week.    Member report that he took his last dose of insulin yesterday, state he is unable to pay the copay which he reports is over $1000.  EMT called endocrinologist office last week to see if there were any samples available, state he is still waiting on call back.  [facility] pharmacist made aware that member is in need of insulin, no more doses left.  He report his blood sugar has been ranging in the 240s.  Does not always follow the proper diet but state he is "working on it."  He has appointment with endocrinologist and PCP on 4/7.  He is also scheduled for his first Covid shot on 4/12.    Denies any urgent concerns, will follow up within the next month.  THN CM Care Plan Problem One     Most Recent Value  Care Plan Problem One  Knowledge deficit related to disease process and mgmt of Diabetes  Role Documenting the Problem One  Care Management Telephonic Coordinator  Care Plan for Problem One  Active  [facility] Long Term Goal   Patient will report a lowering/decrease in A1C level of 11.0 over the next 90 days.  THN Long Term Goal Start Date  08/26/19 [Not met, date reset]  THN Long Term Goal Met Date  09/23/19  [facility] CM Short Term Goal #1   Member will report keeping and attending follow up appointments with endocrinologist and RD within the next 2 weeks  THN CM Short Term Goal #1 Start Date  08/26/19  [facility] CM Short Term  Goal #1 Met Date  09/23/19  THN CM Short Term Goal #2   Member will report having refill on insulin within the next week  THN CM Short Term Goal #2 Start Date  09/23/19  Interventions for Short Term Goal #2  Member educated on importance of getting refills prior to running out of medication.  Collaborated with [facility] pharmacist regarding need for refill.    THN CM Care Plan Problem Three     Most Recent Value  Care Plan Problem Three  Patient at risk for hospital readmission due to co-morbidities.  Role Documenting the Problem Three  Care Management Telephonic Coordinator  Care Plan for Problem Three  Active  THN Long Term Goal   Patient will have no readmission to hospital over the next 31 days.  THN Long Term Goal Start Date  07/08/19  THN Long Term Goal Met Date  08/26/19  [facility] CM Short Term Goal #1   Patient will complete all MD follow up appts in the next 30 days.  THN CM Short Term Goal #1 Start Date  07/08/19  THN CM Short Term Goal #1 Met Date  08/26/19  THN CM Short Term Goal #2   Patient will report adhering to med regimen 100% of the time over the next 30 days.  THN CM Short Term Goal #2 Start Date  07/08/19  [facility] CM  Short Term Goal #2 Met Date  08/26/19     [name], RN, MSN [place] Manager [phone]

## 2019-09-24 ENCOUNTER — Other Ambulatory Visit: Payer: Self-pay | Admitting: Endocrinology

## 2019-09-24 ENCOUNTER — Other Ambulatory Visit: Payer: Self-pay | Admitting: Pharmacist

## 2019-09-24 ENCOUNTER — Other Ambulatory Visit: Payer: Self-pay

## 2019-09-24 MED ORDER — BASAGLAR KWIKPEN 100 UNIT/ML ~~LOC~~ SOPN: 280.0000 [IU] | PEN_INJECTOR | SUBCUTANEOUS | 11 refills | Status: DC

## 2019-09-24 NOTE — Progress Notes (Signed)
Outpatient Medication Detail   Disp Refills Start End   Insulin Glargine (BASAGLAR KWIKPEN) 100 UNIT/ML 35 pen 11 09/24/2019    Sig - Route: Inject 2.8 mLs (280 Units total) into the skin every morning. - Subcutaneous   Sent to pharmacy as: Insulin Glargine (BASAGLAR KWIKPEN) 100 UNIT/ML   E-Prescribing Status: Receipt confirmed by pharmacy (09/24/2019  2:58 PM EDT)

## 2019-09-24 NOTE — Patient Outreach (Signed)
[place] [facility])  [place] 09/24/2019  Sreekar Broyhill 31-Oct-1960 [id number]  Patient assistance program:  Financial documents (social security statement) AND Extra Help denial letter received from patient via EMT Marylouise Stacks.      Per review of paperwork:  Patient denied for Extra Help (applied 07/25/2019) due to not responding to request for additional information.  He will no longer have any form of Extra Help as of April 1.   Based on new income for 2021 and SSI, he may now qualify for FULL Extra Help based on monthly income of $1466 and limit for FULL Extra Help is $1469.  This is likely why additional information requested from patient but unfortunately he did not turn this in.    Options:  1. Re-apply for Extra Help 2. Continue to try to apply for PAPs. Since patient at this moment does not have Extra Help, he may qualify for the [employer] Patient Assistance Program through [employer] (excludes patients with any form of Extra Help).  3. Bill insulin through [facility] Medicare for $35 / month starting April 1 if above options are not approved yet 4. Obtain emergency supply through [employer]) as patient already used up emergency supply through [employer] in Dec 2020.    Spoke with patient re: adherence.  Discussed importance of diet with patient.  He confirmed he will work hard to follow diabetic diet to help keep CBGs at goal.  He has 1 more insulin pen at home.    Spoke with [facility] pharmacy technician who is not in the office today.  She will fax in paperwork to [employer] for [place] with updated financial information from EMT.   [place] for emergency supply coupon card.  Provided this information to [facility].  Unfortunately, last RX for [employer] sent to [facility] instead of [facility] so new RX will need to be sent to pharmacy for patient to fill.    Updated EMT on the phone.  Will route note to [facility] RN.    Plan: Will route  note to office to make request for Basaglar RX be sent to [facility]  Will f/u next week to assist patient with re-applying for Extra Help and find out if approved for [place] PAP  Ralene Bathe, PharmD, [place] [phone]

## 2019-09-25 ENCOUNTER — Other Ambulatory Visit: Payer: Self-pay | Admitting: Pharmacy Technician

## 2019-09-25 NOTE — Patient Outreach (Signed)
[place] [facility]) Care Management  09/25/2019  Celso Granja [birth year] [id number]   Received patient and provider portion(s) of patient assistance application(s) for [place]. Faxed completed application and required documents into [employer].  Will follow up with company(ies) in 10-14 business days to check status of application(s).  [name] [name], [place] Certified Pharmacy Technician [employer] engineer

## 2019-09-27 ENCOUNTER — Encounter (HOSPITAL_COMMUNITY): Payer: Self-pay | Admitting: Internal Medicine

## 2019-09-27 ENCOUNTER — Ambulatory Visit (HOSPITAL_BASED_OUTPATIENT_CLINIC_OR_DEPARTMENT_OTHER)
Admission: RE | Admit: 2019-09-27 | Discharge: 2019-09-27 | Disposition: A | Discharge status: Home / Self Care | Payer: Medicare Other | Source: Ambulatory Visit

## 2019-09-27 ENCOUNTER — Other Ambulatory Visit: Payer: Self-pay

## 2019-09-27 ENCOUNTER — Other Ambulatory Visit (HOSPITAL_COMMUNITY): Payer: Self-pay

## 2019-09-27 ENCOUNTER — Ambulatory Visit (HOSPITAL_COMMUNITY)
Admission: RE | Admit: 2019-09-27 | Discharge: 2019-09-27 | Disposition: A | Discharge status: Home / Self Care | Payer: Medicare Other | Source: Ambulatory Visit | Attending: Internal Medicine | Admitting: Internal Medicine

## 2019-09-27 VITALS — BP 120/66 | HR 93 | Ht 64.0 in | Wt 154.2 lb

## 2019-09-27 DIAGNOSIS — I5022 Chronic systolic (congestive) heart failure: Secondary | ICD-10-CM | POA: Diagnosis not present

## 2019-09-27 DIAGNOSIS — R0602 Shortness of breath: Secondary | ICD-10-CM | POA: Diagnosis not present

## 2019-09-27 DIAGNOSIS — I5042 Chronic combined systolic (congestive) and diastolic (congestive) heart failure: Secondary | ICD-10-CM | POA: Insufficient documentation

## 2019-09-27 DIAGNOSIS — R413 Other amnesia: Secondary | ICD-10-CM | POA: Insufficient documentation

## 2019-09-27 DIAGNOSIS — I110 Hypertensive heart disease with heart failure: Secondary | ICD-10-CM | POA: Insufficient documentation

## 2019-09-27 DIAGNOSIS — Z79899 Other long term (current) drug therapy: Secondary | ICD-10-CM | POA: Insufficient documentation

## 2019-09-27 DIAGNOSIS — E1140 Type 2 diabetes mellitus with diabetic neuropathy, unspecified: Secondary | ICD-10-CM | POA: Diagnosis not present

## 2019-09-27 DIAGNOSIS — Z8616 Personal history of COVID-19: Secondary | ICD-10-CM | POA: Diagnosis not present

## 2019-09-27 DIAGNOSIS — I480 Paroxysmal atrial fibrillation: Secondary | ICD-10-CM | POA: Insufficient documentation

## 2019-09-27 DIAGNOSIS — Z8249 Family history of ischemic heart disease and other diseases of the circulatory system: Secondary | ICD-10-CM | POA: Diagnosis not present

## 2019-09-27 DIAGNOSIS — I252 Old myocardial infarction: Secondary | ICD-10-CM | POA: Insufficient documentation

## 2019-09-27 DIAGNOSIS — R079 Chest pain, unspecified: Secondary | ICD-10-CM

## 2019-09-27 DIAGNOSIS — E669 Obesity, unspecified: Secondary | ICD-10-CM | POA: Insufficient documentation

## 2019-09-27 DIAGNOSIS — I2510 Atherosclerotic heart disease of native coronary artery without angina pectoris: Secondary | ICD-10-CM | POA: Insufficient documentation

## 2019-09-27 DIAGNOSIS — E785 Hyperlipidemia, unspecified: Secondary | ICD-10-CM | POA: Insufficient documentation

## 2019-09-27 DIAGNOSIS — I10 Essential (primary) hypertension: Secondary | ICD-10-CM | POA: Diagnosis not present

## 2019-09-27 DIAGNOSIS — Z794 Long term (current) use of insulin: Secondary | ICD-10-CM | POA: Insufficient documentation

## 2019-09-27 DIAGNOSIS — I428 Other cardiomyopathies: Secondary | ICD-10-CM | POA: Insufficient documentation

## 2019-09-27 DIAGNOSIS — Z7901 Long term (current) use of anticoagulants: Secondary | ICD-10-CM | POA: Insufficient documentation

## 2019-09-27 DIAGNOSIS — R5383 Other fatigue: Secondary | ICD-10-CM | POA: Diagnosis not present

## 2019-09-27 LAB — BASIC METABOLIC PANEL
Anion gap: 13 (ref 5–15)
BUN: 15 mg/dL (ref 6–20)
CO2: 24 mmol/L (ref 22–32)
Calcium: 9.3 mg/dL (ref 8.9–10.3)
Chloride: 100 mmol/L (ref 98–111)
Creatinine, Ser: 1.43 mg/dL — ABNORMAL HIGH (ref 0.61–1.24)
GFR calc Af Amer: >60 mL/min (ref >60)
GFR calc non Af Amer: 53 mL/min — ABNORMAL LOW (ref >60)
Glucose, Bld: 264 mg/dL — ABNORMAL HIGH (ref 70–99)
Potassium: 4.4 mmol/L (ref 3.5–5.1)
Sodium: 137 mmol/L (ref 135–145)

## 2019-09-27 LAB — CBC
HCT: 44.2 % (ref 39.0–52.0)
Hemoglobin: 15.1 g/dL (ref 13.0–17.0)
MCH: 30.8 pg (ref 26.0–34.0)
MCHC: 34.2 g/dL (ref 30.0–36.0)
MCV: 90.2 fL (ref 80.0–100.0)
Platelets: 270 10*3/uL (ref 150–400)
RBC: 4.9 MIL/uL (ref 4.22–5.81)
RDW: 11.9 % (ref 11.5–15.5)
WBC: 6.2 10*3/uL (ref 4.0–10.5)
nRBC: 0 % (ref 0.0–0.2)

## 2019-09-27 MED ORDER — SODIUM CHLORIDE 0.9% FLUSH: 3.0000 mL | Freq: Two times a day (BID) | INTRAVENOUS | Status: DC

## 2019-09-27 NOTE — Progress Notes (Signed)
  Echocardiogram 2D Echocardiogram has been performed.  Calvin [name] 09/27/2019, 2:45 PM

## 2019-09-27 NOTE — Patient Instructions (Addendum)
Labs today We will only contact you if something comes back abnormal or we need to make some changes. Otherwise no news is good news!  Your physician recommends that you schedule a follow-up appointment in: 6 months with Dr Haroldine Laws. You will get a call to schedule this appointment.    Please call office at [phone] option 2 if you have any questions or concerns.    [facility] [place] [id number] [place] [id number] Dept: [phone] Loc: [phone]  Jaime Grizzell  09/27/2019  You are scheduled for a Cardiac Catheterization on Tuesday, March 30 with Dr. Glori Bickers.  1. Please arrive at the [facility] (Main Entrance A) at [facility]: [address] [place], [place] [address] at 5:30 AM (This time is two hours before your procedure to ensure your preparation). Free valet parking service is available.   Special note: Every effort is made to have your procedure done on time. Please understand that emergencies sometimes delay scheduled procedures.  2. Diet: Do not eat solid foods or drink fluids after midnight.   3. Labs: You will need a pre procedure COVID test    WHEN:  Saturday, March 27th, 2021 at 11:35am WHERE: [facility]       [place] [id number]  This is a drive thru testing site, you will remain in your car. Be sure to get in the line FOR PROCEDURES Once you have been swabbed you will need to remain home in quarantine until you return for your procedure.   4. Medication instructions in preparation for your procedure:   Contrast Allergy: No  *For reference purposes while preparing patient instructions.   Delete this med list prior to printing instructions for patient.*  HOLD Eliquis on Monday 09/30/19 and the morning of your procedure    HOLD ALL Medications on the morning of your procedure.   DO NOT TAKE Your diabetes  medicine on the morning of your procedure   5. Plan for one night stay--bring personal belongings. 6. Bring a current list of your medications and current insurance cards. 7. You MUST have a responsible person to drive you home. 8. Someone MUST be with you the first 24 hours after you arrive home or your discharge will be delayed. 9. Please wear clothes that are easy to get on and off and wear slip-on shoes.  Thank you for allowing [place] to care for you!   -- [place] [facility]

## 2019-09-27 NOTE — Progress Notes (Signed)
ADVANCED HF CLINIC NOTE  Referring Physician: Primary Care: Dr [name]  Primary Cardiologist: Dr Angelena Form  Neurology: Dr Delice Lesch  [facility]: Dr. Haroldine Laws   HPI  Mr Vanscyoc is 59 year old with a history of chronic systolic heart failure due to NICM with EF 35-40%, uncontrolled DMII, GERD, HTN, MI 2015, memory issues, and hyperlipidemia. Of note he was homeless for 6 years but was able to get housing in February of last year.   Admitted with 08/2017 with chest pain. 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  Readmitted in 10/2017 with chest pain. CTA was negative for PE. He was also admitted in 03/2018 with chest pain. He had a brief episode of A fib and was started on eliquis and carvedilol was increased to 6.25 mg twice a day.  This was not thought to be ACS.   In the past he was on Entresto but this was later stopped due to dizziness. Placed back on lisinopril.   Had sleep study 07/2018 that was normal.   Had repeat 2D Echo 01/2019 that showed EF 35-40%, mild LVH, normal RV size and function. No significant valvular dysfunction.   06/2019, he was admitted to [facility] for Covid 19 infection. Was placed on BiPAP but did not require intubation. Was there for about 3 weeks.   Since Covid, he has continued w/ persistent fatigue and persistent dyspnea. Followed by Paramedicine once a week.   Seen by Dr. Angelena Form 2/1. Had EKG that showed NSR. No afib. RRR on exam today. He remains on Eliquis for PAF.  Says ever since COVID he has been tired all the time. SOB with any activity. Off/on CP. Has to use walker. C/o diabetic neuropathy. Doesn't smoke or drink. Add last visit added spiro and digoxin. No improvement.   Echo today 09/27/19 EF 40-45% RV ok.   ECHO 12/2017  EF 35-40% Grade II DD. Moderate global reduction in LV systolic function; moderate   diastolic dysfunction. ECHO 09/2017  EF 20-25% Grade IDD  LHC/RHC 08/2017  RA 5  PA 22/11 (15)  PCWP 9  CO 6  CI 3/14   Prox RCA to Mid RCA lesion is 30% stenosed.  Mid RCA to Dist RCA lesion is 30% stenosed.  Prox Cx to Dist Cx lesion is 20% stenosed.  Mid LAD lesion is 30% stenosed.  There is mild to moderate left ventricular systolic dysfunction.  LV end diastolic pressure is mildly elevated.  The left ventricular ejection fraction is 35-45% by visual estimate.  There is no mitral valve regurgitation.    SH: Disabled for 6 years due to diabetes. He does not smoke. Denies cocaine abuse.  Past Medical History:  Diagnosis Date  . Adenoidal hypertrophy   . Adenomatous colon polyps 2012  . Arthritis   . Diabetes mellitus   . GERD (gastroesophageal reflux disease)   . Heart attack ([place])    [place] living in [place]  [name] Hx of adenomatous colonic polyps 08/18/2017  . Hyperlipidemia   . Hypertension   . Mild CAD    a. cath in 08/2017 showing mild nonobstructive CAD with scattered 20-30% stenosis.   . Nonischemic cardiomyopathy ([place])    a. EF 20-25% by echo in 09/2017 with cath showing mild CAD. b.  Last echo 12/2017 EF 35-40%, grade 2 DD.  [name] Obesity   . PAF (paroxysmal atrial fibrillation) ([place])   . Sleep apnea    cpap, pt says no longer has  Current Outpatient Medications  Medication Sig Dispense Refill  . albuterol (VENTOLIN HFA) 108 (90 Base) MCG/ACT inhaler Inhale 2 puffs into the lungs every 6 (six) hours as needed for wheezing or shortness of breath. 6.7 g 0  . amitriptyline (ELAVIL) 10 MG tablet Take 2 tablets at bed time 180 tablet 3  . apixaban (ELIQUIS) 5 MG TABS tablet Take 5 mg by mouth 2 (two) times daily.    . carvedilol (COREG) 6.25 MG tablet Take 1 tablet (6.25 mg total) by mouth 2 (two) times daily with a meal. 180 tablet 3  . digoxin (LANOXIN) 0.125 MG tablet Take 1 tablet (0.125 mg total) by mouth daily. 90 tablet 3  . donepezil (ARICEPT) 10 MG tablet Take 1 tablet (10 mg total) by mouth at bedtime. 90 tablet 3  . Evolocumab (REPATHA SURECLICK) 267 MG/ML SOAJ Inject 1  pen into the skin every 14 (fourteen) days. 6 pen 3  . ezetimibe (ZETIA) 10 MG tablet Take 1 tablet (10 mg total) by mouth daily. 90 tablet 3  . glucose blood (ONETOUCH ULTRA) test strip 1 each by Other route 2 (two) times daily. E11.9 180 each 0  . Insulin Glargine (BASAGLAR KWIKPEN) 100 UNIT/ML Inject 2.8 mLs (280 Units total) into the skin every morning. 10 pen 11  . Insulin Syringe-Needle U-100 (INSULIN SYRINGE .3CC/29GX1/2") 29G X 1/2" 0.3 ML MISC Inject 1 Syringe 3 (three) times daily as directed. Check blood sugar three times daily. Dx: E11.9 100 each 3  . Lancets (ONETOUCH DELICA PLUS [id number]) MISC 1 each by Does not apply route 2 (two) times daily. E11.9 180 each 0  . lisinopril (ZESTRIL) 10 MG tablet Take 1 tablet (10 mg total) by mouth at bedtime. 90 tablet 3  . nitroGLYCERIN (NITROSTAT) 0.4 MG SL tablet Place 1 tablet (0.4 mg total) under the tongue every 5 (five) minutes as needed for chest pain. 30 tablet 1  . polyethylene glycol (MIRALAX / GLYCOLAX) 17 g packet Take 17 g by mouth 2 (two) times daily. 14 each 0  . senna (SENOKOT) 8.6 MG TABS tablet Take 1 tablet (8.6 mg total) by mouth daily as needed for mild constipation. 15 tablet 0  . spironolactone (ALDACTONE) 25 MG tablet Take 1 tablet (25 mg total) by mouth daily. 90 tablet 3   No current facility-administered medications for this encounter.    No Known Allergies    Social History   Socioeconomic History  . Marital status: Single    Spouse name: Not on file  . Number of children: 0  . Years of education: Not on file  . Highest education level: Not on file  Occupational History  . Occupation: Disability  Tobacco Use  . Smoking status: Never Smoker  . Smokeless tobacco: Never Used  Substance and Sexual Activity  . Alcohol use: No  . Drug use: No  . Sexual activity: Yes    Birth control/protection: None  Other Topics Concern  . Not on file  Social History Narrative   Social History   Diet?    Do you  drink/eat things with caffeine? yes   Marital status?       single      Do you live in a house, apartment, assisted living, condo, trailer, etc.? yes   Is it one or more stories? One story   How many persons live in your home?   Do you have any pets in your home? (please list)    Highest level of education completed?  graduate   Do you exercise?            no                          Type & how often?   Advanced Directives   Do you have a living will? no   Do you have a DNR form?                                  If not, do you want to discuss one? no   Do you have signed POA/HPOA for forms? no      Functional Status   Do you have difficulty bathing or dressing yourself? no   Do you have difficulty preparing food or eating? no   Do you have difficulty managing your medications? no   Do you have difficulty managing your finances? no   Do you have difficulty affording your medications? no   Social Determinants of Health   Financial Resource Strain:   . Difficulty of Paying Living Expenses:   Food Insecurity: No Food Insecurity  . Worried About Charity fundraiser in the Last Year: Never true  . Ran Out of Food in the Last Year: Never true  Transportation Needs: Unmet Transportation Needs  . Lack of Transportation (Medical): Yes  . Lack of Transportation (Non-Medical): Yes  Physical Activity:   . Days of Exercise per Week:   . Minutes of Exercise per Session:   Stress:   . Feeling of Stress :   Social Connections:   . Frequency of Communication with Friends and Family:   . Frequency of Social Gatherings with Friends and Family:   . Attends Religious Services:   . Active Member of Clubs or Organizations:   . Attends Archivist Meetings:   [name] Marital Status:   Intimate Partner Violence:   . Fear of Current or Ex-Partner:   . Emotionally Abused:   [name] Physically Abused:   . Sexually Abused:       Family History  Problem Relation Age of Onset  . Heart disease Mother    . Heart attack Mother 69  . Hypertension Mother   . Hyperlipidemia Mother   . Diabetes Mother   . Heart disease Father   . Heart attack Father 62  . Hypertension Father   . Hyperlipidemia Father   . Diabetes Father   . Heart attack Sister 52  . Colon cancer Neg Hx   . Stomach cancer Neg Hx   . Esophageal cancer Neg Hx   . Rectal cancer Neg Hx     Vitals:   09/27/19 1445  BP: 120/66  Pulse: 93  SpO2: 99%  Weight: 69.9 kg (154 lb 3.2 oz)  Height: 5' 4"  (1.626 m)   Wt Readings from Last 3 Encounters:  09/27/19 69.9 kg (154 lb 3.2 oz)  09/19/19 71.7 kg (158 lb)  09/12/19 73.5 kg (162 lb)     PHYSICAL EXAM:  General:  Fatigue appearing. SOB with exertion HEENT: normal Neck: supple. no JVD. Carotids 2+ bilat; no bruits. No lymphadenopathy or thryomegaly appreciated. Cor: PMI nondisplaced. Regular rate & rhythm. No rubs, gallops or murmurs. Lungs: clear Abdomen: soft, nontender, nondistended. No hepatosplenomegaly. No bruits or masses. Good bowel sounds. Extremities: no cyanosis, clubbing, rash, edema Neuro: alert & orientedx3, cranial nerves grossly intact. moves all 4 extremities w/o difficulty.  Affect pleasant    ASSESSMENT & PLAN: 1. Chronic Systolic /Diastolic Heart Failure, NICM - ECHO 12/2017 EF 35-40%. LHC 2/219 with nonobstructive CAD.  - ECHO 01/2019, EF 35-40% - Echo today (09/27/19) EF 40-45% RV ok  - Continue to complain of severe fatigue and dyspnea after COVID 19 infection 06/2019 - Volume status ok  - Continue lisinopril 10 mg qhs.  He was intolerant entresto due to dizziness.  - Continue Coreg 6.25 mg bid - Continue spironolactone 12.5 mg daily - Coninue digoxin 0.125 mg daily  - I suspect most of symptoms not cardiac related but he is very symptomatic. We discussed options and he wants to proceed with repeat cath to more definitively evaluate. - I suspect depression/stress playing a role as he is essentially homeless but living with the mother of his  goddaughter and he says it is very stressful   2. HTN - controlled on current regimen. No orthostatic symptoms.   3. Memory Loss - On Aricept per Neurology  - Paramedicine to continue to help with medications.   4. Uncotrolled DM - Most recent hgb A1C 11.9  - Management per Dr. Loanne Drilling  - Consider later addition of SGLT2i   5. CAD:  -mild nonobstructive CAD by cath 08/2017 - having episodes of CP - no ASA w/ Eliquis  - continue  blocker - Zetia for cholesterol - repeat cath as above  9. PAF - recent EKG 08/05/19 showed NSR.  - in NSR on exam today - Eliquis for a/c. Denies abnormal bleeding.     Glori Bickers, MD  3:14 PM

## 2019-09-27 NOTE — H&P (View-Only) (Signed)
ADVANCED HF CLINIC NOTE  Referring Physician: Primary Care: Dr [name]  Primary Cardiologist: Dr Angelena Form  Neurology: Dr Delice Lesch  [facility]: Dr. Haroldine Laws   HPI  Calvin [name] is 59 year old with a history of chronic systolic heart failure due to NICM with EF 35-40%, uncontrolled DMII, GERD, HTN, MI 2015, memory issues, and hyperlipidemia. Of note he was homeless for 6 years but was able to get housing in February of last year.   Admitted with 08/2017 with chest pain. 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  Readmitted in 10/2017 with chest pain. CTA was negative for PE. He was also admitted in 03/2018 with chest pain. He had a brief episode of A fib and was started on eliquis and carvedilol was increased to 6.25 mg twice a day.  This was not thought to be ACS.   In the past he was on Entresto but this was later stopped due to dizziness. Placed back on lisinopril.   Had sleep study 07/2018 that was normal.   Had repeat 2D Echo 01/2019 that showed EF 35-40%, mild LVH, normal RV size and function. No significant valvular dysfunction.   06/2019, he was admitted to [facility] for Covid 19 infection. Was placed on BiPAP but did not require intubation. Was there for about 3 weeks.   Since Covid, he has continued w/ persistent fatigue and persistent dyspnea. Followed by Paramedicine once a week.   Seen by Dr. Angelena Form 2/1. Had EKG that showed NSR. No afib. RRR on exam today. He remains on Eliquis for PAF.  Says ever since COVID he has been tired all the time. SOB with any activity. Off/on CP. Has to use walker. C/o diabetic neuropathy. Doesn't smoke or drink. Add last visit added spiro and digoxin. No improvement.   Echo today 09/27/19 EF 40-45% RV ok.   ECHO 12/2017  EF 35-40% Grade II DD. Moderate global reduction in LV systolic function; moderate   diastolic dysfunction. ECHO 09/2017  EF 20-25% Grade IDD  LHC/RHC 08/2017  RA 5  PA 22/11 (15)  PCWP 9  CO 6  CI 3/14   Prox RCA to Mid RCA lesion is 30% stenosed.  Mid RCA to Dist RCA lesion is 30% stenosed.  Prox Cx to Dist Cx lesion is 20% stenosed.  Mid LAD lesion is 30% stenosed.  There is mild to moderate left ventricular systolic dysfunction.  LV end diastolic pressure is mildly elevated.  The left ventricular ejection fraction is 35-45% by visual estimate.  There is no mitral valve regurgitation.    SH: Disabled for 6 years due to diabetes. He does not smoke. Denies cocaine abuse.  Past Medical History:  Diagnosis Date  . Adenoidal hypertrophy   . Adenomatous colon polyps 2012  . Arthritis   . Diabetes mellitus   . GERD (gastroesophageal reflux disease)   . Heart attack ([place])    [place]  [name] Hx of adenomatous colonic polyps 08/18/2017  . Hyperlipidemia   . Hypertension   . Mild CAD    a. cath in 08/2017 showing mild nonobstructive CAD with scattered 20-30% stenosis.   . Nonischemic cardiomyopathy ([place])    a. EF 20-25% by echo in 09/2017 with cath showing mild CAD. b.  Last echo 12/2017 EF 35-40%, grade 2 DD.  [name] Obesity   . PAF (paroxysmal atrial fibrillation) ([place])   . Sleep apnea    cpap, pt says no longer has  Current Outpatient Medications  Medication Sig Dispense Refill  . albuterol (VENTOLIN HFA) 108 (90 Base) MCG/ACT inhaler Inhale 2 puffs into the lungs every 6 (six) hours as needed for wheezing or shortness of breath. 6.7 g 0  . amitriptyline (ELAVIL) 10 MG tablet Take 2 tablets at bed time 180 tablet 3  . apixaban (ELIQUIS) 5 MG TABS tablet Take 5 mg by mouth 2 (two) times daily.    . carvedilol (COREG) 6.25 MG tablet Take 1 tablet (6.25 mg total) by mouth 2 (two) times daily with a meal. 180 tablet 3  . digoxin (LANOXIN) 0.125 MG tablet Take 1 tablet (0.125 mg total) by mouth daily. 90 tablet 3  . donepezil (ARICEPT) 10 MG tablet Take 1 tablet (10 mg total) by mouth at bedtime. 90 tablet 3  . Evolocumab (REPATHA SURECLICK) 361 MG/ML SOAJ Inject 1  pen into the skin every 14 (fourteen) days. 6 pen 3  . ezetimibe (ZETIA) 10 MG tablet Take 1 tablet (10 mg total) by mouth daily. 90 tablet 3  . glucose blood (ONETOUCH ULTRA) test strip 1 each by Other route 2 (two) times daily. E11.9 180 each 0  . Insulin Glargine (BASAGLAR KWIKPEN) 100 UNIT/ML Inject 2.8 mLs (280 Units total) into the skin every morning. 10 pen 11  . Insulin Syringe-Needle U-100 (INSULIN SYRINGE .3CC/29GX1/2") 29G X 1/2" 0.3 ML MISC Inject 1 Syringe 3 (three) times daily as directed. Check blood sugar three times daily. Dx: E11.9 100 each 3  . Lancets (ONETOUCH DELICA PLUS [id number]) MISC 1 each by Does not apply route 2 (two) times daily. E11.9 180 each 0  . lisinopril (ZESTRIL) 10 MG tablet Take 1 tablet (10 mg total) by mouth at bedtime. 90 tablet 3  . nitroGLYCERIN (NITROSTAT) 0.4 MG SL tablet Place 1 tablet (0.4 mg total) under the tongue every 5 (five) minutes as needed for chest pain. 30 tablet 1  . polyethylene glycol (MIRALAX / GLYCOLAX) 17 g packet Take 17 g by mouth 2 (two) times daily. 14 each 0  . senna (SENOKOT) 8.6 MG TABS tablet Take 1 tablet (8.6 mg total) by mouth daily as needed for mild constipation. 15 tablet 0  . spironolactone (ALDACTONE) 25 MG tablet Take 1 tablet (25 mg total) by mouth daily. 90 tablet 3   No current facility-administered medications for this encounter.    No Known Allergies    Social History   Socioeconomic History  . Marital status: Single    Spouse name: Not on file  . Number of children: 0  . Years of education: Not on file  . Highest education level: Not on file  Occupational History  . Occupation: Disability  Tobacco Use  . Smoking status: Never Smoker  . Smokeless tobacco: Never Used  Substance and Sexual Activity  . Alcohol use: No  . Drug use: No  . Sexual activity: Yes    Birth control/protection: None  Other Topics Concern  . Not on file  Social History Narrative   Social History   Diet?    Do you  drink/eat things with caffeine? yes   Marital status?       single      Do you live in a house, apartment, assisted living, condo, trailer, etc.? yes   Is it one or more stories? One story   How many persons live in your home?   Do you have any pets in your home? (please list)    Highest level of education completed?  graduate   Do you exercise?            no                          Type & how often?   Advanced Directives   Do you have a living will? no   Do you have a DNR form?                                  If not, do you want to discuss one? no   Do you have signed POA/HPOA for forms? no      Functional Status   Do you have difficulty bathing or dressing yourself? no   Do you have difficulty preparing food or eating? no   Do you have difficulty managing your medications? no   Do you have difficulty managing your finances? no   Do you have difficulty affording your medications? no   Social Determinants of Health   Financial Resource Strain:   . Difficulty of Paying Living Expenses:   Food Insecurity: No Food Insecurity  . Worried About Charity fundraiser in the Last Year: Never true  . Ran Out of Food in the Last Year: Never true  Transportation Needs: Unmet Transportation Needs  . Lack of Transportation (Medical): Yes  . Lack of Transportation (Non-Medical): Yes  Physical Activity:   . Days of Exercise per Week:   . Minutes of Exercise per Session:   Stress:   . Feeling of Stress :   Social Connections:   . Frequency of Communication with Friends and Family:   . Frequency of Social Gatherings with Friends and Family:   . Attends Religious Services:   . Active Member of Clubs or Organizations:   . Attends Archivist Meetings:   [name] Marital Status:   Intimate Partner Violence:   . Fear of Current or Ex-Partner:   . Emotionally Abused:   [name] Physically Abused:   . Sexually Abused:       Family History  Problem Relation Age of Onset  . Heart disease Mother    . Heart attack Mother 29  . Hypertension Mother   . Hyperlipidemia Mother   . Diabetes Mother   . Heart disease Father   . Heart attack Father 67  . Hypertension Father   . Hyperlipidemia Father   . Diabetes Father   . Heart attack Sister 49  . Colon cancer Neg Hx   . Stomach cancer Neg Hx   . Esophageal cancer Neg Hx   . Rectal cancer Neg Hx     Vitals:   09/27/19 1445  BP: 120/66  Pulse: 93  SpO2: 99%  Weight: 69.9 kg (154 lb 3.2 oz)  Height: 5' 4"  (1.626 m)   Wt Readings from Last 3 Encounters:  09/27/19 69.9 kg (154 lb 3.2 oz)  09/19/19 71.7 kg (158 lb)  09/12/19 73.5 kg (162 lb)     PHYSICAL EXAM:  General:  Fatigue appearing. SOB with exertion HEENT: normal Neck: supple. no JVD. Carotids 2+ bilat; no bruits. No lymphadenopathy or thryomegaly appreciated. Cor: PMI nondisplaced. Regular rate & rhythm. No rubs, gallops or murmurs. Lungs: clear Abdomen: soft, nontender, nondistended. No hepatosplenomegaly. No bruits or masses. Good bowel sounds. Extremities: no cyanosis, clubbing, rash, edema Neuro: alert & orientedx3, cranial nerves grossly intact. moves all 4 extremities w/o difficulty.  Affect pleasant    ASSESSMENT & PLAN: 1. Chronic Systolic /Diastolic Heart Failure, NICM - ECHO 12/2017 EF 35-40%. LHC 2/219 with nonobstructive CAD.  - ECHO 01/2019, EF 35-40% - Echo today (09/27/19) EF 40-45% RV ok  - Continue to complain of severe fatigue and dyspnea after COVID 19 infection 06/2019 - Volume status ok  - Continue lisinopril 10 mg qhs.  He was intolerant entresto due to dizziness.  - Continue Coreg 6.25 mg bid - Continue spironolactone 12.5 mg daily - Coninue digoxin 0.125 mg daily  - I suspect most of symptoms not cardiac related but he is very symptomatic. We discussed options and he wants to proceed with repeat cath to more definitively evaluate. - I suspect depression/stress playing a role as he is essentially homeless but living with the mother of his  goddaughter and he says it is very stressful   2. HTN - controlled on current regimen. No orthostatic symptoms.   3. Memory Loss - On Aricept per Neurology  - Paramedicine to continue to help with medications.   4. Uncotrolled DM - Most recent hgb A1C 11.9  - Management per Dr. Loanne Drilling  - Consider later addition of SGLT2i   5. CAD:  -mild nonobstructive CAD by cath 08/2017 - having episodes of CP - no ASA w/ Eliquis  - continue  blocker - Zetia for cholesterol - repeat cath as above  9. PAF - recent EKG 08/05/19 showed NSR.  - in NSR on exam today - Eliquis for a/c. Denies abnormal bleeding.     Glori Bickers, MD  3:14 PM

## 2019-09-28 ENCOUNTER — Other Ambulatory Visit (HOSPITAL_COMMUNITY)
Admission: RE | Admit: 2019-09-28 | Discharge: 2019-09-28 | Disposition: A | Discharge status: Home / Self Care | Payer: Medicare Other | Source: Ambulatory Visit | Attending: Internal Medicine | Admitting: Internal Medicine

## 2019-09-28 DIAGNOSIS — Z01812 Encounter for preprocedural laboratory examination: Secondary | ICD-10-CM | POA: Diagnosis not present

## 2019-09-28 DIAGNOSIS — Z20822 Contact with and (suspected) exposure to covid-19: Secondary | ICD-10-CM | POA: Insufficient documentation

## 2019-09-28 LAB — SARS CORONAVIRUS 2 (TAT 6-24 HRS): SARS Coronavirus 2: NEGATIVE

## 2019-09-30 ENCOUNTER — Ambulatory Visit: Payer: Self-pay | Admitting: Pharmacist

## 2019-10-01 ENCOUNTER — Other Ambulatory Visit: Payer: Self-pay

## 2019-10-01 ENCOUNTER — Ambulatory Visit (HOSPITAL_COMMUNITY)
Admission: RE | Admit: 2019-10-01 | Discharge: 2019-10-01 | Disposition: A | Discharge status: Home / Self Care | Payer: Medicare Other | Attending: Internal Medicine | Admitting: Internal Medicine

## 2019-10-01 ENCOUNTER — Encounter (HOSPITAL_COMMUNITY)
Admission: RE | Disposition: A | Discharge status: Home / Self Care | Payer: Self-pay | Source: Home / Self Care | Attending: Internal Medicine

## 2019-10-01 ENCOUNTER — Encounter: Payer: Self-pay | Admitting: Cardiology

## 2019-10-01 DIAGNOSIS — I5042 Chronic combined systolic (congestive) and diastolic (congestive) heart failure: Secondary | ICD-10-CM | POA: Diagnosis not present

## 2019-10-01 DIAGNOSIS — I252 Old myocardial infarction: Secondary | ICD-10-CM | POA: Diagnosis not present

## 2019-10-01 DIAGNOSIS — I2510 Atherosclerotic heart disease of native coronary artery without angina pectoris: Secondary | ICD-10-CM | POA: Diagnosis not present

## 2019-10-01 DIAGNOSIS — K219 Gastro-esophageal reflux disease without esophagitis: Secondary | ICD-10-CM | POA: Insufficient documentation

## 2019-10-01 DIAGNOSIS — I5022 Chronic systolic (congestive) heart failure: Secondary | ICD-10-CM

## 2019-10-01 DIAGNOSIS — I480 Paroxysmal atrial fibrillation: Secondary | ICD-10-CM | POA: Diagnosis not present

## 2019-10-01 DIAGNOSIS — E785 Hyperlipidemia, unspecified: Secondary | ICD-10-CM | POA: Diagnosis not present

## 2019-10-01 DIAGNOSIS — E669 Obesity, unspecified: Secondary | ICD-10-CM | POA: Insufficient documentation

## 2019-10-01 DIAGNOSIS — Z794 Long term (current) use of insulin: Secondary | ICD-10-CM | POA: Insufficient documentation

## 2019-10-01 DIAGNOSIS — G4730 Sleep apnea, unspecified: Secondary | ICD-10-CM | POA: Insufficient documentation

## 2019-10-01 DIAGNOSIS — I428 Other cardiomyopathies: Secondary | ICD-10-CM | POA: Insufficient documentation

## 2019-10-01 DIAGNOSIS — Z6825 Body mass index (BMI) 25.0-25.9, adult: Secondary | ICD-10-CM | POA: Diagnosis not present

## 2019-10-01 DIAGNOSIS — E119 Type 2 diabetes mellitus without complications: Secondary | ICD-10-CM | POA: Insufficient documentation

## 2019-10-01 DIAGNOSIS — Z79899 Other long term (current) drug therapy: Secondary | ICD-10-CM | POA: Diagnosis not present

## 2019-10-01 DIAGNOSIS — R413 Other amnesia: Secondary | ICD-10-CM | POA: Insufficient documentation

## 2019-10-01 DIAGNOSIS — Z8616 Personal history of COVID-19: Secondary | ICD-10-CM | POA: Diagnosis not present

## 2019-10-01 DIAGNOSIS — I110 Hypertensive heart disease with heart failure: Secondary | ICD-10-CM | POA: Diagnosis not present

## 2019-10-01 DIAGNOSIS — Z7901 Long term (current) use of anticoagulants: Secondary | ICD-10-CM | POA: Diagnosis not present

## 2019-10-01 HISTORY — PX: RIGHT/LEFT HEART CATH AND CORONARY ANGIOGRAPHY: CATH118266

## 2019-10-01 LAB — POCT I-STAT EG7
Acid-base deficit: 2 mmol/L (ref 0.0–2.0)
Acid-base deficit: 2 mmol/L (ref 0.0–2.0)
Acid-base deficit: 2 mmol/L (ref 0.0–2.0)
Bicarbonate: 22.9 mmol/L (ref 20.0–28.0)
Bicarbonate: 23.8 mmol/L (ref 20.0–28.0)
Bicarbonate: 24.4 mmol/L (ref 20.0–28.0)
Calcium, Ion: 1.09 mmol/L — ABNORMAL LOW (ref 1.15–1.40)
Calcium, Ion: 1.17 mmol/L (ref 1.15–1.40)
Calcium, Ion: 1.2 mmol/L (ref 1.15–1.40)
HCT: 36 % — ABNORMAL LOW (ref 39.0–52.0)
HCT: 36 % — ABNORMAL LOW (ref 39.0–52.0)
HCT: 38 % — ABNORMAL LOW (ref 39.0–52.0)
Hemoglobin: 12.2 g/dL — ABNORMAL LOW (ref 13.0–17.0)
Hemoglobin: 12.2 g/dL — ABNORMAL LOW (ref 13.0–17.0)
Hemoglobin: 12.9 g/dL — ABNORMAL LOW (ref 13.0–17.0)
O2 Saturation: 100 %
O2 Saturation: 78 %
O2 Saturation: 85 %
Potassium: 3.5 mmol/L (ref 3.5–5.1)
Potassium: 3.6 mmol/L (ref 3.5–5.1)
Potassium: 3.7 mmol/L (ref 3.5–5.1)
Sodium: 141 mmol/L (ref 135–145)
Sodium: 141 mmol/L (ref 135–145)
Sodium: 142 mmol/L (ref 135–145)
TCO2: 24 mmol/L (ref 22–32)
TCO2: 25 mmol/L (ref 22–32)
TCO2: 26 mmol/L (ref 22–32)
pCO2, Ven: 40.2 mmHg — ABNORMAL LOW (ref 44.0–60.0)
pCO2, Ven: 45.9 mmHg (ref 44.0–60.0)
pCO2, Ven: 47.4 mmHg (ref 44.0–60.0)
pH, Ven: 7.319 (ref 7.250–7.430)
pH, Ven: 7.324 (ref 7.250–7.430)
pH, Ven: 7.363 (ref 7.250–7.430)
pO2, Ven: 178 mmHg — ABNORMAL HIGH (ref 32.0–45.0)
pO2, Ven: 47 mmHg — ABNORMAL HIGH (ref 32.0–45.0)
pO2, Ven: 54 mmHg — ABNORMAL HIGH (ref 32.0–45.0)

## 2019-10-01 LAB — GLUCOSE, CAPILLARY: Glucose-Capillary: 236 mg/dL — ABNORMAL HIGH (ref 70–99)

## 2019-10-01 SURGERY — RIGHT/LEFT HEART CATH AND CORONARY ANGIOGRAPHY
Anesthesia: LOCAL

## 2019-10-01 MED ORDER — MIDAZOLAM HCL 2 MG/2ML IJ SOLN
INTRAMUSCULAR | Status: DC | PRN
  Administered 2019-10-01 (×2): 1 mg via INTRAVENOUS

## 2019-10-01 MED ORDER — ONDANSETRON HCL 4 MG/2ML IJ SOLN: 4.0000 mg | Freq: Four times a day (QID) | INTRAMUSCULAR | Status: DC | PRN

## 2019-10-01 MED ORDER — SODIUM CHLORIDE 0.9 % IV SOLN: INTRAVENOUS | Status: AC

## 2019-10-01 MED ORDER — SODIUM CHLORIDE 0.9 % IV SOLN: 250.0000 mL | INTRAVENOUS | Status: DC | PRN

## 2019-10-01 MED ORDER — LIDOCAINE HCL (PF) 1 % IJ SOLN
INTRAMUSCULAR | Status: AC
  Filled 2019-10-01: qty 30

## 2019-10-01 MED ORDER — LIDOCAINE HCL (PF) 1 % IJ SOLN
INTRAMUSCULAR | Status: DC | PRN
  Administered 2019-10-01 (×2): 3 mL

## 2019-10-01 MED ORDER — FENTANYL CITRATE (PF) 100 MCG/2ML IJ SOLN
INTRAMUSCULAR | Status: AC
  Filled 2019-10-01: qty 2

## 2019-10-01 MED ORDER — HEPARIN SODIUM (PORCINE) 1000 UNIT/ML IJ SOLN
INTRAMUSCULAR | Status: DC | PRN
  Administered 2019-10-01: 3500 [IU] via INTRAVENOUS

## 2019-10-01 MED ORDER — IOHEXOL 350 MG/ML SOLN
INTRAVENOUS | Status: DC | PRN
  Administered 2019-10-01: 30 mL

## 2019-10-01 MED ORDER — SODIUM CHLORIDE 0.9% FLUSH: 3.0000 mL | Freq: Two times a day (BID) | INTRAVENOUS | Status: DC

## 2019-10-01 MED ORDER — VERAPAMIL HCL 2.5 MG/ML IV SOLN
INTRAVENOUS | Status: AC
  Filled 2019-10-01: qty 2

## 2019-10-01 MED ORDER — FENTANYL CITRATE (PF) 100 MCG/2ML IJ SOLN
INTRAMUSCULAR | Status: DC | PRN
  Administered 2019-10-01: 25 ug via INTRAVENOUS

## 2019-10-01 MED ORDER — HEPARIN (PORCINE) IN NACL 1000-0.9 UT/500ML-% IV SOLN
INTRAVENOUS | Status: DC | PRN
  Administered 2019-10-01: 500 mL

## 2019-10-01 MED ORDER — HYDRALAZINE HCL 20 MG/ML IJ SOLN: 10.0000 mg | INTRAMUSCULAR | Status: DC | PRN

## 2019-10-01 MED ORDER — HEPARIN (PORCINE) IN NACL 1000-0.9 UT/500ML-% IV SOLN
INTRAVENOUS | Status: AC
  Filled 2019-10-01: qty 1000

## 2019-10-01 MED ORDER — ASPIRIN 81 MG PO CHEW
81.0000 mg | CHEWABLE_TABLET | ORAL | Status: AC
  Administered 2019-10-01: 81 mg via ORAL
  Filled 2019-10-01: qty 1

## 2019-10-01 MED ORDER — VERAPAMIL HCL 2.5 MG/ML IV SOLN
INTRAVENOUS | Status: DC | PRN
  Administered 2019-10-01: 10 mL via INTRA_ARTERIAL

## 2019-10-01 MED ORDER — ACETAMINOPHEN 325 MG PO TABS: 650.0000 mg | ORAL_TABLET | ORAL | Status: DC | PRN

## 2019-10-01 MED ORDER — SODIUM CHLORIDE 0.9% FLUSH: 3.0000 mL | INTRAVENOUS | Status: DC | PRN

## 2019-10-01 MED ORDER — HEPARIN SODIUM (PORCINE) 1000 UNIT/ML IJ SOLN
INTRAMUSCULAR | Status: AC
  Filled 2019-10-01: qty 1

## 2019-10-01 MED ORDER — SODIUM CHLORIDE 0.9 % IV SOLN: INTRAVENOUS | Status: DC

## 2019-10-01 MED ORDER — MIDAZOLAM HCL 2 MG/2ML IJ SOLN
INTRAMUSCULAR | Status: AC
  Filled 2019-10-01: qty 2

## 2019-10-01 MED ORDER — LABETALOL HCL 5 MG/ML IV SOLN: 10.0000 mg | INTRAVENOUS | Status: DC | PRN

## 2019-10-01 SURGICAL SUPPLY — 11 items
CATH 5FR JL3.5 JR4 ANG PIG MP (CATHETERS) ×1 IMPLANT
CATH BALLN WEDGE 5F 110CM (CATHETERS) ×1 IMPLANT
DEVICE RAD COMP TR BAND LRG (VASCULAR PRODUCTS) ×1 IMPLANT
GLIDESHEATH SLEND SS 6F .021 (SHEATH) ×1 IMPLANT
GUIDEWIRE INQWIRE 1.5J.035X260 (WIRE) IMPLANT
INQWIRE 1.5J .035X260CM (WIRE) ×2
KIT MICROPUNCTURE NIT STIFF (SHEATH) ×1 IMPLANT
PACK CARDIAC CATHETERIZATION (CUSTOM PROCEDURE TRAY) ×2 IMPLANT
SHEATH GLIDE SLENDER 4/5FR (SHEATH) ×1 IMPLANT
SHEATH PROBE COVER 6X72 (BAG) ×1 IMPLANT
TRANSDUCER W/STOPCOCK (MISCELLANEOUS) ×2 IMPLANT

## 2019-10-01 NOTE — Interval H&P Note (Signed)
History and Physical Interval Note:  10/01/2019 7:58 AM  Calvin [name]  has presented today for surgery, with the diagnosis of heart failure.  The various methods of treatment have been discussed with the patient and family. After consideration of risks, benefits and other options for treatment, the patient has consented to  Procedure(s): RIGHT/LEFT HEART CATH AND CORONARY ANGIOGRAPHY (N/A)and possible coronary angioplasty  as a surgical intervention.  The patient's history has been reviewed, patient examined, no change in status, stable for surgery.  I have reviewed the patient's chart and labs.  Questions were answered to the patient's satisfaction.     [name]

## 2019-10-01 NOTE — Discharge Instructions (Signed)
Radial Site Care  This sheet gives you information about how to care for yourself after your procedure. Your health care provider may also give you more specific instructions. If you have problems or questions, contact your health care provider. What can I expect after the procedure? After the procedure, it is common to have:  Bruising and tenderness at the catheter insertion area. Follow these instructions at home: Medicines  Take over-the-counter and prescription medicines only as told by your health care provider. Insertion site care  Follow instructions from your health care provider about how to take care of your insertion site. Make sure you: ? Wash your hands with soap and water before you change your bandage (dressing). If soap and water are not available, use hand sanitizer. ? Change your dressing as told by your health care provider. ? Leave stitches (sutures), skin glue, or adhesive strips in place. These skin closures may need to stay in place for 2 weeks or longer. If adhesive strip edges start to loosen and curl up, you may trim the loose edges. Do not remove adhesive strips completely unless your health care provider tells you to do that.  Check your insertion site every day for signs of infection. Check for: ? Redness, swelling, or pain. ? Fluid or blood. ? Pus or a bad smell. ? Warmth.  Do not take baths, swim, or use a hot tub until your health care provider approves.  You may shower 24-48 hours after the procedure, or as directed by your health care provider. ? Remove the dressing and gently wash the site with plain soap and water. ? Pat the area dry with a clean towel. ? Do not rub the site. That could cause bleeding.  Do not apply powder or lotion to the site. Activity   For 24 hours after the procedure, or as directed by your health care provider: ? Do not flex or bend the affected arm. ? Do not push or pull heavy objects with the affected arm. ? Do not  drive yourself home from the hospital or clinic. You may drive 24 hours after the procedure unless your health care provider tells you not to. ? Do not operate machinery or power tools.  Do not lift anything that is heavier than 10 lb (4.5 kg), or the limit that you are told, until your health care provider says that it is safe.  Ask your health care provider when it is okay to: ? Return to work or school. ? Resume usual physical activities or sports. ? Resume sexual activity. General instructions  If the catheter site starts to bleed, raise your arm and put firm pressure on the site. If the bleeding does not stop, get help right away. This is a medical emergency.  If you went home on the same day as your procedure, a responsible adult should be with you for the first 24 hours after you arrive home.  Keep all follow-up visits as told by your health care provider. This is important. Contact a health care provider if:  You have a fever.  You have redness, swelling, or yellow drainage around your insertion site. Get help right away if:  You have unusual pain at the radial site.  The catheter insertion area swells very fast.  The insertion area is bleeding, and the bleeding does not stop when you hold steady pressure on the area.  Your arm or hand becomes pale, cool, tingly, or numb. These symptoms may represent a serious problem   that is an emergency. Do not wait to see if the symptoms will go away. Get medical help right away. Call your local emergency services (911 in the [place] Do not drive yourself to the hospital. Summary  After the procedure, it is common to have bruising and tenderness at the site.  Follow instructions from your health care provider about how to take care of your radial site wound. Check the wound every day for signs of infection.  Do not lift anything that is heavier than 10 lb (4.5 kg), or the limit that you are told, until your health care provider says  that it is safe. This information is not intended to replace advice given to you by your health care provider. Make sure you discuss any questions you have with your health care provider. Document Revised: 07/26/2017 Document Reviewed: 07/26/2017 Elsevier Patient Education  2020 Else[employer] Inc[employer]  

## 2019-10-02 ENCOUNTER — Other Ambulatory Visit (HOSPITAL_COMMUNITY): Payer: Self-pay

## 2019-10-02 NOTE — Progress Notes (Signed)
Paramedicine Encounter    Patient ID: Calvin [name], male    DOB: 14-Aug-1960, 59 y.o.   MRN: [id number]   [name], NP as PCP - General (Geriatric Medicine) [name], MD as PCP - Cardiology (Cardiology) [name], [name], MD as PCP - Advanced Heart Failure (Cardiology) [name], MD as Consulting Physician (Endocrinology) Rudean Haskell, [facility] as [place] Management (Pharmacist) [name], RN as [place] Management Adaline Sill, CPhT as [employer], corporate)  Patient Active Problem List   Diagnosis Date Noted  . COVID-19 virus detected 06/18/2019  . Right sided weakness 06/17/2019  . AKI (acute kidney injury) ([place]) 06/17/2019  . Foot pain, bilateral 02/01/2019  . Abdominal pain 01/23/2019  . Chest pain 01/23/2019  . Right leg pain 12/20/2018  . Overgrown toenails 12/20/2018  . Unsteady gait 12/20/2018  . Encounter for therapeutic drug monitoring 10/22/2018  . Nausea and vomiting 03/10/2018  . AF (paroxysmal atrial fibrillation) ([place]) 03/10/2018  . Non-cardiac chest pain 03/09/2018  . Diabetic neuropathy ([place]) 09/26/2017  . MCI (mild cognitive impairment) 06/23/2017  . OSA on CPAP 05/10/2017  . Gastroesophageal reflux disease 05/10/2017  . Insomnia 05/10/2017  . 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 Chronic systolic heart failure ([place]) 11/22/2015  . Essential hypertension 11/22/2015  . DM (diabetes mellitus) ([place]) 11/20/2015  . HLD (hyperlipidemia) 11/20/2015  . Hx of adenomatous colonic polyps 05/19/2011    Current Outpatient Medications:  .  albuterol (VENTOLIN HFA) 108 (90 Base) MCG/ACT inhaler, Inhale 2 puffs into the lungs every 6 (six) hours as needed for wheezing or shortness of breath., Disp: 6.7 g, Rfl: 0 .  amitriptyline (ELAVIL) 10 MG tablet, Take  2 tablets at bed time (Patient taking differently: Take 20 mg by mouth at bedtime. ), Disp: 180 tablet, Rfl: 3 .  apixaban (ELIQUIS) 5 MG TABS tablet, Take 5 mg by mouth 2 (two) times daily., Disp: , Rfl:  .  carvedilol (COREG) 6.25 MG tablet, Take 1 tablet (6.25 mg total) by mouth 2 (two) times daily with a meal., Disp: 180 tablet, Rfl: 3 .  digoxin (LANOXIN) 0.125 MG tablet, Take 1 tablet (0.125 mg total) by mouth daily., Disp: 90 tablet, Rfl: 3 .  donepezil (ARICEPT) 10 MG tablet, Take 1 tablet (10 mg total) by mouth at bedtime., Disp: 90 tablet, Rfl: 3 .  Evolocumab (REPATHA SURECLICK) 607 MG/ML SOAJ, Inject 1 pen into the skin every 14 (fourteen) days. (Patient taking differently: Inject 140 mg into the skin every 14 (fourteen) days. ), Disp: 6 pen, Rfl: 3 .  ezetimibe (ZETIA) 10 MG tablet, Take 1 tablet (10 mg total) by mouth daily. (Patient taking differently: Take 10 mg by mouth at bedtime. ), Disp: 90 tablet, Rfl: 3 .  glucose blood (ONETOUCH ULTRA) test strip, 1 each by Other route 2 (two) times daily. E11.9, Disp: 180 each, Rfl: 0 .  Insulin Glargine (BASAGLAR KWIKPEN) 100 UNIT/ML, Inject 2.8 mLs (280 Units total) into the skin every morning., Disp: 10 pen, Rfl: 11 .  Insulin Syringe-Needle U-100 (INSULIN SYRINGE .3CC/29GX1/2") 29G X 1/2" 0.3 ML MISC, Inject 1 Syringe 3 (three) times daily as directed. Check blood sugar three times daily. Dx: E11.9, Disp: 100 each, Rfl: 3 .  Lancets (ONETOUCH DELICA PLUS PXTGGY69S) MISC, 1 each by Does not apply route 2 (two) times daily. E11.9, Disp: 180 each, Rfl:  0 .  lisinopril (ZESTRIL) 10 MG tablet, Take 1 tablet (10 mg total) by mouth at bedtime., Disp: 90 tablet, Rfl: 3 .  polyethylene glycol (MIRALAX / GLYCOLAX) 17 g packet, Take 17 g by mouth 2 (two) times daily., Disp: 14 each, Rfl: 0 .  senna (SENOKOT) 8.6 MG TABS tablet, Take 1 tablet (8.6 mg total) by mouth daily as needed for mild constipation., Disp: 15 tablet, Rfl: 0 .  spironolactone  (ALDACTONE) 25 MG tablet, Take 1 tablet (25 mg total) by mouth daily., Disp: 90 tablet, Rfl: 3 .  nitroGLYCERIN (NITROSTAT) 0.4 MG SL tablet, Place 1 tablet (0.4 mg total) under the tongue every 5 (five) minutes as needed for chest pain. (Patient not taking: Reported on 10/02/2019), Disp: 30 tablet, Rfl: 1 No Known Allergies    Social History   Socioeconomic History  . Marital status: Single    Spouse name: Not on file  . Number of children: 0  . Years of education: Not on file  . Highest education level: Not on file  Occupational History  . Occupation: Disability  Tobacco Use  . Smoking status: Never Smoker  . Smokeless tobacco: Never Used  Substance and Sexual Activity  . Alcohol use: No  . Drug use: No  . Sexual activity: Yes    Birth control/protection: None  Other Topics Concern  . Not on file  Social History Narrative   Social History   Diet?    Do you drink/eat things with caffeine? yes   Marital status?       single      Do you live in a house, apartment, assisted living, condo, trailer, etc.? yes   Is it one or more stories? One story   How many persons live in your home?   Do you have any pets in your home? (please list)    Highest level of education completed? graduate   Do you exercise?            no                          Type & how often?   Advanced Directives   Do you have a living will? no   Do you have a DNR form?                                  If not, do you want to discuss one? no   Do you have signed POA/HPOA for forms? no      Functional Status   Do you have difficulty bathing or dressing yourself? no   Do you have difficulty preparing food or eating? no   Do you have difficulty managing your medications? no   Do you have difficulty managing your finances? no   Do you have difficulty affording your medications? no   Social Determinants of Health   Financial Resource Strain:   . Difficulty of Paying Living Expenses:   Food Insecurity: No  Food Insecurity  . Worried About Charity fundraiser in the Last Year: Never true  . Ran Out of Food in the Last Year: Never true  Transportation Needs: Unmet Transportation Needs  . Lack of Transportation (Medical): Yes  . Lack of Transportation (Non-Medical): Yes  Physical Activity:   . Days of Exercise per Week:   . Minutes of Exercise per Session:   Stress:   .  Feeling of Stress :   Social Connections:   . Frequency of Communication with Friends and Family:   . Frequency of Social Gatherings with Friends and Family:   . Attends Religious Services:   . Active Member of Clubs or Organizations:   . Attends Archivist Meetings:   [name] Marital Status:   Intimate Partner Violence:   . Fear of Current or Ex-Partner:   . Emotionally Abused:   [name] Physically Abused:   . Sexually Abused:     Physical Exam      Future Appointments  Date Time Provider [place]  10/09/2019  9:15 AM [name], MD [facility] None  10/09/2019 10:30 AM [name], NP [facility] None  10/14/2019 12:15 PM [place] [facility]  03/18/2020 11:30 AM Cameron Sprang, MD [facility] None    BP 132/78   Pulse 99   Temp 98.3 F (36.8 C)   Resp 16   Wt 158 lb (71.7 kg)   SpO2 99%   BMI 27.12 kg/m  [facility]-283 Weight yesterday- Last visit weight-158   Pt was seen in clinic last week, heart cath was sch and that was done yesterday.  He reports he always wakes up tired since he had COVID.    -insulin one more day left-[facility]leen is working on assistance apps for him--some has been turned in but right now its a waiting process.   --no meds missed meds verified and pill box refilled  I advised him to be very, very cautious with his sugar/pastas/breads intake since he will run out of insulin in the next day or so. And it looks like it will take another week or so to hear back from any med assist program.   Marylouise Stacks, [place]  Paramedic  10/02/19

## 2019-10-03 NOTE — Telephone Encounter (Signed)
I see Nancee Liter was worked on for patient assistance however the box we just got is tresiba. Is this ok? What will the dosing be due to this change

## 2019-10-04 NOTE — Telephone Encounter (Signed)
bsaglar please

## 2019-10-07 ENCOUNTER — Other Ambulatory Visit: Payer: Self-pay

## 2019-10-08 ENCOUNTER — Telehealth (HOSPITAL_COMMUNITY): Payer: Self-pay

## 2019-10-08 LAB — POCT I-STAT EG7
Acid-base deficit: 6 mmol/L — ABNORMAL HIGH (ref 0.0–2.0)
Bicarbonate: 19.5 mmol/L — ABNORMAL LOW (ref 20.0–28.0)
Calcium, Ion: 0.79 mmol/L — CL (ref 1.15–1.40)
HCT: 30 % — ABNORMAL LOW (ref 39.0–52.0)
Hemoglobin: 10.2 g/dL — ABNORMAL LOW (ref 13.0–17.0)
O2 Saturation: 82 %
Potassium: 2.7 mmol/L — CL (ref 3.5–5.1)
Sodium: 149 mmol/L — ABNORMAL HIGH (ref 135–145)
TCO2: 21 mmol/L — ABNORMAL LOW (ref 22–32)
pCO2, Ven: 37.8 mmHg — ABNORMAL LOW (ref 44.0–60.0)
pH, Ven: 7.32 (ref 7.250–7.430)
pO2, Ven: 49 mmHg — ABNORMAL HIGH (ref 32.0–45.0)

## 2019-10-08 NOTE — Telephone Encounter (Signed)
I contacted pt regarding what time he was able to be seen today. i told him last week I would have to see him today as I will be out rest of the week. He texted back stating he was out of town. So I will have to see him next week. He should have ran out of insulin by now, still waiting approval of med assist apps. So I told him to be extra careful with what he puts in.  Will f/u next week.   Marylouise Stacks, EMT-Paramedic  10/08/19

## 2019-10-09 ENCOUNTER — Encounter: Payer: Self-pay | Admitting: Endocrinology

## 2019-10-09 ENCOUNTER — Ambulatory Visit (INDEPENDENT_AMBULATORY_CARE_PROVIDER_SITE_OTHER): Payer: Medicare Other | Admitting: Endocrinology

## 2019-10-09 ENCOUNTER — Other Ambulatory Visit: Payer: Self-pay

## 2019-10-09 ENCOUNTER — Ambulatory Visit (INDEPENDENT_AMBULATORY_CARE_PROVIDER_SITE_OTHER): Payer: Medicare Other | Admitting: Nurse Practitioner

## 2019-10-09 ENCOUNTER — Encounter: Payer: Self-pay | Admitting: Nurse Practitioner

## 2019-10-09 VITALS — BP 120/70 | HR 84 | Temp 97.1°F | Ht 64.0 in | Wt 160.2 lb

## 2019-10-09 VITALS — BP 130/70 | HR 83 | Ht 64.0 in | Wt 161.0 lb

## 2019-10-09 DIAGNOSIS — I10 Essential (primary) hypertension: Secondary | ICD-10-CM | POA: Diagnosis not present

## 2019-10-09 DIAGNOSIS — K5909 Other constipation: Secondary | ICD-10-CM

## 2019-10-09 DIAGNOSIS — R0602 Shortness of breath: Secondary | ICD-10-CM | POA: Diagnosis not present

## 2019-10-09 DIAGNOSIS — Z794 Long term (current) use of insulin: Secondary | ICD-10-CM

## 2019-10-09 DIAGNOSIS — E1140 Type 2 diabetes mellitus with diabetic neuropathy, unspecified: Secondary | ICD-10-CM

## 2019-10-09 DIAGNOSIS — L602 Onychogryphosis: Secondary | ICD-10-CM

## 2019-10-09 DIAGNOSIS — E785 Hyperlipidemia, unspecified: Secondary | ICD-10-CM

## 2019-10-09 DIAGNOSIS — G3184 Mild cognitive impairment, so stated: Secondary | ICD-10-CM

## 2019-10-09 DIAGNOSIS — E1149 Type 2 diabetes mellitus with other diabetic neurological complication: Secondary | ICD-10-CM

## 2019-10-09 LAB — POCT GLYCOSYLATED HEMOGLOBIN (HGB A1C): Hemoglobin A1C: 9.2 % — AB (ref 4.0–5.6)

## 2019-10-09 MED ORDER — BASAGLAR KWIKPEN 100 UNIT/ML ~~LOC~~ SOPN: 300.0000 [IU] | PEN_INJECTOR | SUBCUTANEOUS | 11 refills | Status: DC

## 2019-10-09 NOTE — Patient Instructions (Addendum)
To get fleets enema due to constipation - to use this today to help make bowels move  To increase water intake- make sure you are using 8 oz to mix with the miralax  Make sure you are drinking enough fluids.    Constipation, Adult Constipation is when a person has fewer bowel movements in a week than normal, has difficulty having a bowel movement, or has stools that are dry, hard, or larger than normal. Constipation may be caused by an underlying condition. It may become worse with age if a person takes certain medicines and does not take in enough fluids. Follow these instructions at home: Eating and drinking   Eat foods that have a lot of fiber, such as fresh fruits and vegetables, whole grains, and beans.  Limit foods that are high in fat, low in fiber, or overly processed, such as french fries, hamburgers, cookies, candies, and soda.  Drink enough fluid to keep your urine clear or pale yellow. General instructions  Exercise regularly or as told by your health care provider.  Go to the restroom when you have the urge to go. Do not hold it in.  Take over-the-counter and prescription medicines only as told by your health care provider. These include any fiber supplements.  Practice pelvic floor retraining exercises, such as deep breathing while relaxing the lower abdomen and pelvic floor relaxation during bowel movements.  Watch your condition for any changes.  Keep all follow-up visits as told by your health care provider. This is important. Contact a health care provider if:  You have pain that gets worse.  You have a fever.  You do not have a bowel movement after 4 days.  You vomit.  You are not hungry.  You lose weight.  You are bleeding from the anus.  You have thin, pencil-like stools. Get help right away if:  You have a fever and your symptoms suddenly get worse.  You leak stool or have blood in your stool.  Your abdomen is bloated.  You have severe  pain in your abdomen.  You feel dizzy or you faint. This information is not intended to replace advice given to you by your health care provider. Make sure you discuss any questions you have with your health care provider. Document Revised: 06/02/2017 Document Reviewed: 12/09/2015 Elsevier Patient Education  2020 [employer].

## 2019-10-09 NOTE — Patient Instructions (Addendum)
Please increase the Basaglar to 280 units daily.  check your blood sugar twice a day.  vary the time of day when you check, between before the 3 meals, and at bedtime.  also check if you have symptoms of your blood sugar being too high or too low.  please keep a record of the readings and bring it to your next appointment here (or you can bring the meter itself).  You can write it on any piece of paper.  please call us sooner if your blood sugar goes below 70, or if you have a lot of readings over 200. Please come back for a follow-up appointment in 2 months.

## 2019-10-09 NOTE — Progress Notes (Signed)
Careteam: [name], NP as PCP - General (Geriatric Medicine) [name], MD as PCP - Cardiology (Cardiology) [name], [name], MD as PCP - Advanced Heart Failure (Cardiology) [name], MD as Consulting Physician (Endocrinology) Rudean Haskell, [facility] as [place] Management (Pharmacist) [name], RN as [place] Management [name], [name], CPhT as [employer], corporate)  [place]:  [place] Directive information    No Known Allergies  Chief Complaint  Patient presents with  . Medical Management of Chronic Issues    6 week follow-up, moderate fall risk   . Form Completion    Disability placard   . Constipation    Ongoing concern, tried laxtives with minimual relief   . Medication Management    Discuss Repatha and if patient is to continue      HPI: Patient is a 59 y.o. male seen in office for 6 week follow up.   Reports he has a nurse that helps him with medication and make sure he gets to his appts.  Pt following closely with cardiologist due to ongoing shortness of breat and debility after COVID. He opted to repeat cath for more definitve evaluation, done on 10/01/19.  He saw endocrinologist this morning, noncompliant with medication regimen. A1c 9.2, difficult to treat due to his noncompliance with medication regimen- reports he ran out of money to get medications last month. He has refilled medication at this time.   Continues with constipation- taking miralax and senakot not effective.reports he barely drinks any water- using a little water to mix the miralax. Minimal fluids throughout the day.   Hyperlipidemia- states he is not taking rapatha.   Review of Systems:  Review of Systems  Constitutional: Negative for chills, fever and weight loss.  Respiratory: Positive for shortness of breath. Negative for cough  and sputum production.   Cardiovascular: Negative for chest pain, palpitations and leg swelling.  Gastrointestinal: Positive for constipation. Negative for abdominal pain, diarrhea and heartburn.  Genitourinary: Negative for dysuria, frequency and urgency.  Musculoskeletal: Negative for back pain, falls, joint pain and myalgias.  Skin: Negative.   Neurological: Negative for dizziness and headaches.  Psychiatric/Behavioral: Negative for depression and memory loss. The patient does not have insomnia.     Past Medical History:  Diagnosis Date  . Adenoidal hypertrophy   . Adenomatous colon polyps 2012  . Arthritis   . Diabetes mellitus   . GERD (gastroesophageal reflux disease)   . Heart attack ([place])    [place]  [name] Hx of adenomatous colonic polyps 08/18/2017  . Hyperlipidemia   . Hypertension   . Mild CAD    a. cath in 08/2017 showing mild nonobstructive CAD with scattered 20-30% stenosis.   . Nonischemic cardiomyopathy ([place])    a. EF 20-25% by echo in 09/2017 with cath showing mild CAD. b.  Last echo 12/2017 EF 35-40%, grade 2 DD.  [name] Obesity   . PAF (paroxysmal atrial fibrillation) ([place])   . Sleep apnea    cpap, pt says no longer has   Past Surgical History:  Procedure Laterality Date  . COLONOSCOPY  05/13/11   9 adenomas  . FOREARM SURGERY    . MUSCLE BIOPSY    . RIGHT/LEFT HEART CATH AND CORONARY ANGIOGRAPHY N/A 08/25/2017   Procedure: RIGHT/LEFT HEART CATH AND CORONARY ANGIOGRAPHY;  Surgeon: [name], MD;  Location: [place] [facility];  Service: Cardiovascular;  Laterality: N/A;  . RIGHT/LEFT HEART CATH AND CORONARY ANGIOGRAPHY N/A 10/01/2019   Procedure: RIGHT/LEFT HEART CATH AND CORONARY ANGIOGRAPHY;  Surgeon: [name], MD;  Location: [place] [facility];  Service: Cardiovascular;  Laterality: N/A;   Social History:   reports that he has never smoked. He has never used smokeless tobacco. He reports that he does not drink alcohol or use  drugs.  Family History  Problem Relation Age of Onset  . Heart disease Mother   . Heart attack Mother 107  . Hypertension Mother   . Hyperlipidemia Mother   . Diabetes Mother   . Heart disease Father   . Heart attack Father 34  . Hypertension Father   . Hyperlipidemia Father   . Diabetes Father   . Heart attack Sister 15  . Colon cancer Neg Hx   . Stomach cancer Neg Hx   . Esophageal cancer Neg Hx   . Rectal cancer Neg Hx     Medications: Patient's Medications  New Prescriptions   No medications on file  Previous Medications   ALBUTEROL (VENTOLIN HFA) 108 (90 BASE) MCG/ACT INHALER    Inhale 2 puffs into the lungs every 6 (six) hours as needed for wheezing or shortness of breath.   AMITRIPTYLINE (ELAVIL) 10 MG TABLET    Take 2 tablets at bed time   APIXABAN (ELIQUIS) 5 MG TABS TABLET    Take 5 mg by mouth 2 (two) times daily.   CARVEDILOL (COREG) 6.25 MG TABLET    Take 1 tablet (6.25 mg total) by mouth 2 (two) times daily with a meal.   DIGOXIN (LANOXIN) 0.125 MG TABLET    Take 1 tablet (0.125 mg total) by mouth daily.   DONEPEZIL (ARICEPT) 10 MG TABLET    Take 1 tablet (10 mg total) by mouth at bedtime.   EVOLOCUMAB (REPATHA SURECLICK) 409 MG/ML SOAJ    Inject 1 pen into the skin every 14 (fourteen) days.   EZETIMIBE (ZETIA) 10 MG TABLET    Take 1 tablet (10 mg total) by mouth daily.   GLUCOSE BLOOD (ONETOUCH ULTRA) TEST STRIP    1 each by Other route 2 (two) times daily. E11.9   INSULIN GLARGINE (BASAGLAR KWIKPEN) 100 UNIT/ML    Inject 3 mLs (300 Units total) into the skin every morning.   INSULIN SYRINGE-NEEDLE U-100 (INSULIN SYRINGE .3CC/29GX1/2") 29G X 1/2" 0.3 ML MISC    Inject 1 Syringe 3 (three) times daily as directed. Check blood sugar three times daily. Dx: E11.9   LANCETS (ONETOUCH DELICA PLUS [id number]) MISC    1 each by Does not apply route 2 (two) times daily. E11.9   LISINOPRIL (ZESTRIL) 10 MG TABLET    Take 1 tablet (10 mg total) by mouth at bedtime.    NITROGLYCERIN (NITROSTAT) 0.4 MG SL TABLET    Place 1 tablet (0.4 mg total) under the tongue every 5 (five) minutes as needed for chest pain.   POLYETHYLENE GLYCOL (MIRALAX / GLYCOLAX) 17 G PACKET    Take 17 g by mouth 2 (two) times daily.   SENNA (SENOKOT) 8.6 MG TABS TABLET    Take 1 tablet (8.6 mg total) by mouth daily as needed for mild constipation.   SPIRONOLACTONE (ALDACTONE) 25 MG TABLET    Take 1 tablet (25 mg total) by mouth daily.  Modified Medications   No medications on file  Discontinued Medications   No medications on file    Physical Exam:  Vitals:   10/09/19 1028  BP: 120/70  Pulse: 84  Temp: (!) 97.1 F (36.2 C)  TempSrc: Temporal  SpO2: 99%  Weight: 160 lb 3.2 oz (72.7 kg)  Height: 5' 4"  (1.626 m)   Body mass index is 27.5 kg/m. Wt Readings from Last 3 Encounters:  10/09/19 160 lb 3.2 oz (72.7 kg)  10/09/19 161 lb (73 kg)  10/02/19 158 lb (71.7 kg)    Physical Exam Exam conducted with a chaperone present.  Constitutional:      General: He is not in acute distress.    Appearance: He is well-developed. He is not diaphoretic.  HENT:     Head: Normocephalic and atraumatic.     Mouth/Throat:     Pharynx: No oropharyngeal exudate.  Eyes:     Conjunctiva/sclera: Conjunctivae normal.     Pupils: Pupils are equal, round, and reactive to light.  Cardiovascular:     Rate and Rhythm: Normal rate and regular rhythm.     Heart sounds: Normal heart sounds.  Pulmonary:     Effort: Pulmonary effort is normal.     Breath sounds: Normal breath sounds.  Abdominal:     General: Bowel sounds are normal.     Palpations: Abdomen is soft.  Genitourinary:    Rectum: Normal.     Comments: Stool noted, high in rectal vault, no impaction.  Musculoskeletal:        General: No tenderness.     Cervical back: Normal range of motion and neck supple.  Skin:    General: Skin is warm and dry.  Neurological:     Mental Status: He is alert and oriented to person, place, and  time.  Psychiatric:        Cognition and Memory: Cognition is impaired.     Comments: Very forgetful     Labs reviewed: Basic Metabolic Panel: Recent Labs     0000 06/17/19 1240 06/17/19 1300 06/17/19 1643 06/17/19 1650 06/27/19 0205 06/28/19 1240 06/28/19 1819 06/29/19 0250 07/13/19 2042 08/28/19 0943 08/28/19 0943 09/03/19 1200 09/03/19 1200 09/27/19 1527 09/27/19 1527 10/01/19 0809 10/01/19 0820 10/01/19 0825  NA  --  133*  --   --    < > 135  --    < > 134*   < > 139   < > 139   < > 137   < > 142 149*  141 141  K  --  4.0  --   --    < > 4.2  --    < > 4.7   < > 3.9   < > 3.4*   < > 4.4   < > 3.5 2.7*  3.7 3.6  CL  --  97*  --   --    < > 99  --    < > 96*   < > 105  --  102  --  100  --   --   --   --   CO2  --  22  --   --    < > 25  --    < > 25   < > 27  --  30  --  24  --   --   --   --   GLUCOSE  --  254*  --   --    < > 87  --    < > 143*   < > 258*  --  215*  --  264*  --   --   --   --  BUN  --  21*  --   --    < > 35*  --    < > 26*   < > 11  --  6  --  15  --   --   --   --   CREATININE   < > 1.65*  --   --    < > 1.17  --    < > 1.00   < > 1.17  --  1.05  --  1.43*  --   --   --   --   CALCIUM  --  8.6*  --   --    < > 8.6*  --    < > 8.6*   < > 9.1  --  8.7*  --  9.3  --   --   --   --   MG  --  2.4  --   --    < > 2.3 2.6*  --  2.0  --   --   --   --   --   --   --   --   --   --   PHOS  --  3.1  --   --   --   --   --   --   --   --   --   --   --   --   --   --   --   --   --   TSH  --   --  1.854 2.428  --   --   --   --   --   --   --   --   --   --   --   --   --   --   --    < > = values in this interval not displayed.   Liver Function Tests: Recent Labs    06/26/19 0200 06/26/19 0200 06/27/19 0205 06/29/19 0250 08/28/19 0943  AST 32   < > 29 23 12   ALT 26   < > 23 23 12   ALKPHOS 66  --  63 54  --   BILITOT 0.5   < > 0.5 0.4 0.4  PROT 6.7   < > 6.3* 5.6* 5.8*  ALBUMIN 2.8*  --  2.7* 2.6*  --    < > = values in this interval not  displayed.   Recent Labs    01/23/19 1500  LIPASE 31   No results for input(s): AMMONIA in the last 8760 hours. CBC: Recent Labs    06/29/19 0250 06/29/19 0250 07/13/19 2042 07/13/19 2042 08/28/19 0943 08/28/19 0943 09/27/19 1527 09/27/19 1527 10/01/19 0809 10/01/19 0820 10/01/19 0825  WBC 11.3*   < > 6.1  --  4.6  --  6.2  --   --   --   --   NEUTROABS 8.2*  --  3.3  --  2,130  --   --   --   --   --   --   HGB 15.5   < > 14.5   < > 13.2   < > 15.1   < > 12.2* 10.2*  12.9* 12.2*  HCT 44.8   < > 42.9   < > 38.9   < > 44.2   < > 36.0* 30.0*  38.0* 36.0*  MCV 87.0   < > 89.9  --  92.0  --  90.2  --   --   --   --  PLT 417*   < > 162  --  232  --  270  --   --   --   --    < > = values in this interval not displayed.   Lipid Panel: Recent Labs    04/08/19 0801  CHOL 152  HDL 57  LDLCALC 81  TRIG 70  CHOLHDL 2.7   TSH: Recent Labs    06/17/19 1300 06/17/19 1643  TSH 1.854 2.428   A1C: Lab Results  Component Value Date   HGBA1C 9.2 (A) 10/09/2019     Assessment/Plan 1. MCI (mild cognitive impairment) -ongoing, he states he has more help. He has not made appt for pulmomary to follow up on shortness of breath and seems confused over medication. Reports nurse is helping him with that and god daughter helping with appts  2. Type 2 diabetes mellitus with diabetic neuropathy, with long-term current use of insulin ([place]) -saw endocrine today, reinforced need for compliance with diet, routine foot exams and eye exam - Ambulatory referral to Podiatry  3. Essential hypertension Stable. Continue on current regimen at this time.   4. Shortness of breath Overall seems better today, he did go to pulmonary appts  5. Other constipation Handout given on constipation, he needs to be drinking more fluids and educated him on taking miralax with 8 oz of fluids, he can use a fleets enema today and as needed for severe constipation, to notify if this is not successful.    6. Other diabetic neurological complication associated with type 2 diabetes mellitus ([place]) Noted to have neuropathy to LE, sensation intact, needs diabetic nail trim, podiatry referral placed.  Encouraged dietary compliance, routine foot care/monitoring and to keep up with diabetic eye exams through ophthalmology   7. Overgrown toenails - Ambulatory referral to Podiatry  8. Hyperlipidemia, unspecified hyperlipidemia type Unsure if he should still be on rapatha, educated this is an ongoing medication, being prescribed by cardiologist at this time.   Next appt: 3 months. Carlos American. [place], [facility] [phone]

## 2019-10-09 NOTE — Progress Notes (Signed)
Subjective:    Patient ID: Calvin [name], male    DOB: 06-Feb-1961, 59 y.o.   MRN: [id number]  HPI Pt returns for f/u of diabetes mellitus: DM type: Insulin-requiring type 2. Dx'ed: 4034 Complications: polyneuropathy, CAD, renal insuff, and DR.   Therapy: insulin since soon after dx.  DKA: never Severe hypoglycemia: never.   Pancreatitis: never Pancreatic imaging: normal on 2003 CT. Other: due to noncompliance, he is not a candidate for multiple daily injections; he is retired; he gets Engineer, agricultural from pt assistance.   Interval history: pt says cbg's are in the mid-200's.  Pt says he takes the [place] as rx'ed, except for a few days, a few weeks ago.  No recent steroid rx.   Past Medical History:  Diagnosis Date  . Adenoidal hypertrophy   . Adenomatous colon polyps 2012  . Arthritis   . Diabetes mellitus   . GERD (gastroesophageal reflux disease)   . Heart attack ([place])    [place]  [name] Hx of adenomatous colonic polyps 08/18/2017  . Hyperlipidemia   . Hypertension   . Mild CAD    a. cath in 08/2017 showing mild nonobstructive CAD with scattered 20-30% stenosis.   . Nonischemic cardiomyopathy ([place])    a. EF 20-25% by echo in 09/2017 with cath showing mild CAD. b.  Last echo 12/2017 EF 35-40%, grade 2 DD.  [name] Obesity   . PAF (paroxysmal atrial fibrillation) ([place])   . Sleep apnea    cpap, pt says no longer has    Past Surgical History:  Procedure Laterality Date  . COLONOSCOPY  05/13/11   9 adenomas  . FOREARM SURGERY    . MUSCLE BIOPSY    . RIGHT/LEFT HEART CATH AND CORONARY ANGIOGRAPHY N/A 08/25/2017   Procedure: RIGHT/LEFT HEART CATH AND CORONARY ANGIOGRAPHY;  Surgeon: [name], MD;  Location: [facility];  Service: Cardiovascular;  Laterality: N/A;  . RIGHT/LEFT HEART CATH AND CORONARY ANGIOGRAPHY N/A 10/01/2019   Procedure: RIGHT/LEFT HEART CATH AND CORONARY ANGIOGRAPHY;  Surgeon: [name], MD;  Location: [facility];   Service: Cardiovascular;  Laterality: N/A;    Social History   Socioeconomic History  . Marital status: Single    Spouse name: Not on file  . Number of children: 0  . Years of education: Not on file  . Highest education level: Not on file  Occupational History  . Occupation: Disability  Tobacco Use  . Smoking status: Never Smoker  . Smokeless tobacco: Never Used  Substance and Sexual Activity  . Alcohol use: No  . Drug use: No  . Sexual activity: Yes    Birth control/protection: None  Other Topics Concern  . Not on file  Social History Narrative   Social History   Diet?    Do you drink/eat things with caffeine? yes   Marital status?       single      Do you live in a house, apartment, assisted living, condo, trailer, etc.? yes   Is it one or more stories? One story   How many persons live in your home?   Do you have any pets in your home? (please list)    Highest level of education completed? graduate   Do you exercise?            no  Type & how often?   Advanced Directives   Do you have a living will? no   Do you have a DNR form?                                  If not, do you want to discuss one? no   Do you have signed POA/HPOA for forms? no      Functional Status   Do you have difficulty bathing or dressing yourself? no   Do you have difficulty preparing food or eating? no   Do you have difficulty managing your medications? no   Do you have difficulty managing your finances? no   Do you have difficulty affording your medications? no   Social Determinants of Health   Financial Resource Strain:   . Difficulty of Paying Living Expenses:   Food Insecurity: No Food Insecurity  . Worried About Charity fundraiser in the Last Year: Never true  . Ran Out of Food in the Last Year: Never true  Transportation Needs: Unmet Transportation Needs  . Lack of Transportation (Medical): Yes  . Lack of Transportation (Non-Medical): Yes  Physical  Activity:   . Days of Exercise per Week:   . Minutes of Exercise per Session:   Stress:   . Feeling of Stress :   Social Connections:   . Frequency of Communication with Friends and Family:   . Frequency of Social Gatherings with Friends and Family:   . Attends Religious Services:   . Active Member of Clubs or Organizations:   . Attends Archivist Meetings:   [name] Marital Status:   Intimate Partner Violence:   . Fear of Current or Ex-Partner:   . Emotionally Abused:   [name] Physically Abused:   . Sexually Abused:     Current Outpatient Medications on File Prior to Visit  Medication Sig Dispense Refill  . albuterol (VENTOLIN HFA) 108 (90 Base) MCG/ACT inhaler Inhale 2 puffs into the lungs every 6 (six) hours as needed for wheezing or shortness of breath. 6.7 g 0  . amitriptyline (ELAVIL) 10 MG tablet Take 2 tablets at bed time 180 tablet 3  . apixaban (ELIQUIS) 5 MG TABS tablet Take 5 mg by mouth 2 (two) times daily.    . carvedilol (COREG) 6.25 MG tablet Take 1 tablet (6.25 mg total) by mouth 2 (two) times daily with a meal. 180 tablet 3  . digoxin (LANOXIN) 0.125 MG tablet Take 1 tablet (0.125 mg total) by mouth daily. 90 tablet 3  . donepezil (ARICEPT) 10 MG tablet Take 1 tablet (10 mg total) by mouth at bedtime. 90 tablet 3  . Evolocumab (REPATHA SURECLICK) 681 MG/ML SOAJ Inject 1 pen into the skin every 14 (fourteen) days. (Patient not taking: Reported on 10/09/2019) 6 pen 3  . ezetimibe (ZETIA) 10 MG tablet Take 1 tablet (10 mg total) by mouth daily. 90 tablet 3  . glucose blood (ONETOUCH ULTRA) test strip 1 each by Other route 2 (two) times daily. E11.9 180 each 0  . Insulin Syringe-Needle U-100 (INSULIN SYRINGE .3CC/29GX1/2") 29G X 1/2" 0.3 ML MISC Inject 1 Syringe 3 (three) times daily as directed. Check blood sugar three times daily. Dx: E11.9 100 each 3  . Lancets (ONETOUCH DELICA PLUS [id number]) MISC 1 each by Does not apply route 2 (two) times daily. E11.9 180 each 0  .  lisinopril (ZESTRIL) 10 MG tablet Take 1  tablet (10 mg total) by mouth at bedtime. 90 tablet 3  . nitroGLYCERIN (NITROSTAT) 0.4 MG SL tablet Place 1 tablet (0.4 mg total) under the tongue every 5 (five) minutes as needed for chest pain. 30 tablet 1  . polyethylene glycol (MIRALAX / GLYCOLAX) 17 g packet Take 17 g by mouth 2 (two) times daily. 14 each 0  . senna (SENOKOT) 8.6 MG TABS tablet Take 1 tablet (8.6 mg total) by mouth daily as needed for mild constipation. 15 tablet 0  . spironolactone (ALDACTONE) 25 MG tablet Take 1 tablet (25 mg total) by mouth daily. 90 tablet 3   No current facility-administered medications on file prior to visit.    No Known Allergies  Family History  Problem Relation Age of Onset  . Heart disease Mother   . Heart attack Mother 70  . Hypertension Mother   . Hyperlipidemia Mother   . Diabetes Mother   . Heart disease Father   . Heart attack Father 24  . Hypertension Father   . Hyperlipidemia Father   . Diabetes Father   . Heart attack Sister 64  . Colon cancer Neg Hx   . Stomach cancer Neg Hx   . Esophageal cancer Neg Hx   . Rectal cancer Neg Hx     BP 130/70   Pulse 83   Ht 5' 4"  (1.626 m)   Wt 161 lb (73 kg)   SpO2 98%   BMI 27.64 kg/m    Review of Systems He denies hypoglycemia.      Objective:   Physical Exam VITAL SIGNS:  See vs page GENERAL: no distress Pulses: dorsalis pedis intact bilat.   MSK: no deformity of the feet CV: no leg edema Skin:  no ulcer on the feet.  normal color and temp on the feet. Neuro: sensation is intact to touch on the feet Ext: there is bilateral onychomycosis of the toenails.   Lab Results  Component Value Date   HGBA1C 9.2 (A) 10/09/2019       Assessment & Plan:  Insulin-requiring type 2 DM, with DR: worse.  Noncompliance with insulin: he is not a candidate for multiple daily injections.   Patient Instructions  Please increase the Basaglar to 280 units daily.  check your blood sugar twice  a day.  vary the time of day when you check, between before the 3 meals, and at bedtime.  also check if you have symptoms of your blood sugar being too high or too low.  please keep a record of the readings and bring it to your next appointment here (or you can bring the meter itself).  You can write it on any piece of paper.  please call us sooner if your blood sugar goes below 70, or if you have a lot of readings over 200. Please come back for a follow-up appointment in 2 months.

## 2019-10-10 ENCOUNTER — Other Ambulatory Visit: Payer: Self-pay | Admitting: Pharmacy Technician

## 2019-10-10 ENCOUNTER — Ambulatory Visit: Payer: Medicare Other | Attending: Internal Medicine

## 2019-10-10 DIAGNOSIS — Z23 Encounter for immunization: Secondary | ICD-10-CM

## 2019-10-10 NOTE — Patient Outreach (Signed)
[facility]) Care Management  10/10/2019  Cleburn Maiolo 03-Dec-1960 [id number]    Follow up call placed to [employer] regarding patient assistance application(s) for Jessica Priest confirms patient has been approved as of 3/25 until 06/02/20. He states medication has been delivered to [name] office.  Follow up:  Will send note to [place] to inform and will remove myself from care team.  [name]. [name], [place] Certified Pharmacy Technician [employer] engineer

## 2019-10-10 NOTE — Progress Notes (Signed)
   Covid-19 Vaccination Clinic  Name:  Calvin [name]    MRN: [id number] DOB: 1960/09/09  10/10/2019  [name] was observed post Covid-19 immunization for 15 minutes without incident. He was provided with Vaccine Information Sheet and instruction to access the V-Safe system.   [name] was instructed to call 911 with any severe reactions post vaccine: [name] Difficulty breathing  . Swelling of face and throat  . A fast heartbeat  . A bad rash all over body  . Dizziness and weakness   Immunizations Administered    Name Date Dose VIS Date Route   Pfizer COVID-19 Vaccine 10/10/2019  8:36 AM 0.3 mL 06/14/2019 Intramuscular   Manufacturer: [employer], [employer]   Lot: [id number]   [place]: [id number]

## 2019-10-11 ENCOUNTER — Other Ambulatory Visit: Payer: Self-pay | Admitting: Pharmacist

## 2019-10-11 NOTE — Patient Outreach (Signed)
[facility])  [place] 10/11/2019  Calvin [name] Dec 10, 1960 [id number]  Update received from [place] technician that patient approved for Tresiba patient assistance program 09/26/2019 through 06/02/2020.    Successful call with patient today to update.   -Patient confirms he picked up shipment of Tresiba from endocrinology office (Dr. Loanne Drilling) on 10/07/19.  He states he is planning on buying another fridge to store all of these insulin boxes.  We reviewed that he will need to request refills at least 2 weeks before running out to [name] office.   -Assisted patient with re-applying for Extra Help.  He is eligible for FULL extra help based on income documents.   Updated [name] office regarding [name] approval.  Requested office contact patient's pharmacy to de-active insulin prescriptions.    Plan: Will f/u with patient in 1 month re: Extra Help Will route note to EMT with update  Ralene Bathe, PharmD, [place] [phone]

## 2019-10-13 ENCOUNTER — Emergency Department (HOSPITAL_COMMUNITY)
Admission: EM | Admit: 2019-10-13 | Discharge: 2019-10-14 | Disposition: A | Discharge status: Home / Self Care | Payer: Medicare Other | Source: Home / Self Care | Attending: Emergency Medicine | Admitting: Emergency Medicine

## 2019-10-13 ENCOUNTER — Encounter (HOSPITAL_COMMUNITY): Payer: Self-pay

## 2019-10-13 ENCOUNTER — Other Ambulatory Visit: Payer: Self-pay

## 2019-10-13 DIAGNOSIS — E1140 Type 2 diabetes mellitus with diabetic neuropathy, unspecified: Secondary | ICD-10-CM | POA: Diagnosis not present

## 2019-10-13 DIAGNOSIS — E161 Other hypoglycemia: Secondary | ICD-10-CM | POA: Diagnosis not present

## 2019-10-13 DIAGNOSIS — I252 Old myocardial infarction: Secondary | ICD-10-CM | POA: Diagnosis not present

## 2019-10-13 DIAGNOSIS — E11649 Type 2 diabetes mellitus with hypoglycemia without coma: Secondary | ICD-10-CM | POA: Diagnosis not present

## 2019-10-13 DIAGNOSIS — G4733 Obstructive sleep apnea (adult) (pediatric): Secondary | ICD-10-CM | POA: Diagnosis not present

## 2019-10-13 DIAGNOSIS — K5900 Constipation, unspecified: Secondary | ICD-10-CM | POA: Diagnosis not present

## 2019-10-13 DIAGNOSIS — Z20822 Contact with and (suspected) exposure to covid-19: Secondary | ICD-10-CM | POA: Diagnosis not present

## 2019-10-13 DIAGNOSIS — E162 Hypoglycemia, unspecified: Secondary | ICD-10-CM

## 2019-10-13 DIAGNOSIS — I428 Other cardiomyopathies: Secondary | ICD-10-CM | POA: Diagnosis not present

## 2019-10-13 DIAGNOSIS — E785 Hyperlipidemia, unspecified: Secondary | ICD-10-CM | POA: Diagnosis not present

## 2019-10-13 DIAGNOSIS — Z9989 Dependence on other enabling machines and devices: Secondary | ICD-10-CM | POA: Diagnosis not present

## 2019-10-13 DIAGNOSIS — E1142 Type 2 diabetes mellitus with diabetic polyneuropathy: Secondary | ICD-10-CM | POA: Diagnosis not present

## 2019-10-13 DIAGNOSIS — Z8616 Personal history of COVID-19: Secondary | ICD-10-CM | POA: Insufficient documentation

## 2019-10-13 DIAGNOSIS — I110 Hypertensive heart disease with heart failure: Secondary | ICD-10-CM | POA: Diagnosis not present

## 2019-10-13 DIAGNOSIS — T383X1A Poisoning by insulin and oral hypoglycemic [antidiabetic] drugs, accidental (unintentional), initial encounter: Secondary | ICD-10-CM | POA: Diagnosis not present

## 2019-10-13 DIAGNOSIS — Z79899 Other long term (current) drug therapy: Secondary | ICD-10-CM | POA: Insufficient documentation

## 2019-10-13 DIAGNOSIS — I10 Essential (primary) hypertension: Secondary | ICD-10-CM | POA: Insufficient documentation

## 2019-10-13 DIAGNOSIS — Z7901 Long term (current) use of anticoagulants: Secondary | ICD-10-CM | POA: Diagnosis not present

## 2019-10-13 DIAGNOSIS — I480 Paroxysmal atrial fibrillation: Secondary | ICD-10-CM | POA: Diagnosis not present

## 2019-10-13 DIAGNOSIS — R410 Disorientation, unspecified: Secondary | ICD-10-CM | POA: Diagnosis not present

## 2019-10-13 DIAGNOSIS — Z794 Long term (current) use of insulin: Secondary | ICD-10-CM | POA: Insufficient documentation

## 2019-10-13 DIAGNOSIS — I5022 Chronic systolic (congestive) heart failure: Secondary | ICD-10-CM | POA: Diagnosis not present

## 2019-10-13 DIAGNOSIS — E160 Drug-induced hypoglycemia without coma: Secondary | ICD-10-CM | POA: Diagnosis not present

## 2019-10-13 DIAGNOSIS — G3184 Mild cognitive impairment, so stated: Secondary | ICD-10-CM | POA: Diagnosis not present

## 2019-10-13 DIAGNOSIS — T383X5A Adverse effect of insulin and oral hypoglycemic [antidiabetic] drugs, initial encounter: Secondary | ICD-10-CM | POA: Diagnosis not present

## 2019-10-13 DIAGNOSIS — Z833 Family history of diabetes mellitus: Secondary | ICD-10-CM | POA: Diagnosis not present

## 2019-10-13 DIAGNOSIS — K219 Gastro-esophageal reflux disease without esophagitis: Secondary | ICD-10-CM | POA: Diagnosis not present

## 2019-10-13 DIAGNOSIS — I2510 Atherosclerotic heart disease of native coronary artery without angina pectoris: Secondary | ICD-10-CM | POA: Insufficient documentation

## 2019-10-13 DIAGNOSIS — R531 Weakness: Secondary | ICD-10-CM | POA: Diagnosis not present

## 2019-10-13 DIAGNOSIS — I5042 Chronic combined systolic (congestive) and diastolic (congestive) heart failure: Secondary | ICD-10-CM | POA: Diagnosis not present

## 2019-10-13 LAB — COMPREHENSIVE METABOLIC PANEL
ALT: 28 U/L (ref 0–44)
AST: 28 U/L (ref 15–41)
Albumin: 3.5 g/dL (ref 3.5–5.0)
Alkaline Phosphatase: 58 U/L (ref 38–126)
Anion gap: 8 (ref 5–15)
BUN: <5 mg/dL — ABNORMAL LOW (ref 6–20)
CO2: 27 mmol/L (ref 22–32)
Calcium: 8.7 mg/dL — ABNORMAL LOW (ref 8.9–10.3)
Chloride: 105 mmol/L (ref 98–111)
Creatinine, Ser: 0.93 mg/dL (ref 0.61–1.24)
GFR calc Af Amer: >60 mL/min (ref >60)
GFR calc non Af Amer: >60 mL/min (ref >60)
Glucose, Bld: 108 mg/dL — ABNORMAL HIGH (ref 70–99)
Potassium: 3.7 mmol/L (ref 3.5–5.1)
Sodium: 140 mmol/L (ref 135–145)
Total Bilirubin: 0.4 mg/dL (ref 0.3–1.2)
Total Protein: 6.2 g/dL — ABNORMAL LOW (ref 6.5–8.1)

## 2019-10-13 LAB — I-STAT CHEM 8, ED
BUN: 3 mg/dL — ABNORMAL LOW (ref 6–20)
Calcium, Ion: 1.18 mmol/L (ref 1.15–1.40)
Chloride: 101 mmol/L (ref 98–111)
Creatinine, Ser: 1 mg/dL (ref 0.61–1.24)
Glucose, Bld: 101 mg/dL — ABNORMAL HIGH (ref 70–99)
HCT: 43 % (ref 39.0–52.0)
Hemoglobin: 14.6 g/dL (ref 13.0–17.0)
Potassium: 3.6 mmol/L (ref 3.5–5.1)
Sodium: 139 mmol/L (ref 135–145)
TCO2: 27 mmol/L (ref 22–32)

## 2019-10-13 LAB — CBC WITH DIFFERENTIAL/PLATELET
Abs Immature Granulocytes: 0 10*3/uL (ref 0.00–0.07)
Basophils Absolute: 0 10*3/uL (ref 0.0–0.1)
Basophils Relative: 0 %
Eosinophils Absolute: 0 10*3/uL (ref 0.0–0.5)
Eosinophils Relative: 0 %
HCT: 43.6 % (ref 39.0–52.0)
Hemoglobin: 14.7 g/dL (ref 13.0–17.0)
Immature Granulocytes: 0 %
Lymphocytes Relative: 17 %
Lymphs Abs: 0.8 10*3/uL (ref 0.7–4.0)
MCH: 31.3 pg (ref 26.0–34.0)
MCHC: 33.7 g/dL (ref 30.0–36.0)
MCV: 93 fL (ref 80.0–100.0)
Monocytes Absolute: 0.2 10*3/uL (ref 0.1–1.0)
Monocytes Relative: 5 %
Neutro Abs: 3.6 10*3/uL (ref 1.7–7.7)
Neutrophils Relative %: 78 %
Platelets: 203 10*3/uL (ref 150–400)
RBC: 4.69 MIL/uL (ref 4.22–5.81)
RDW: 12 % (ref 11.5–15.5)
WBC: 4.7 10*3/uL (ref 4.0–10.5)
nRBC: 0 % (ref 0.0–0.2)

## 2019-10-13 LAB — CBG MONITORING, ED
Glucose-Capillary: 54 mg/dL — ABNORMAL LOW (ref 70–99)
Glucose-Capillary: 152 mg/dL — ABNORMAL HIGH (ref 70–99)
Glucose-Capillary: 79 mg/dL (ref 70–99)

## 2019-10-13 MED ORDER — DEXTROSE 50 % IV SOLN
INTRAVENOUS | Status: AC
  Filled 2019-10-13: qty 50

## 2019-10-13 MED ORDER — DEXTROSE 50 % IV SOLN
50.0000 mL | Freq: Once | INTRAVENOUS | Status: AC
  Administered 2019-10-13: 21:00:00 50 mL via INTRAVENOUS

## 2019-10-13 NOTE — ED Notes (Signed)
Pt. CBG 79, RN, Levada Dy made aware.

## 2019-10-13 NOTE — ED Notes (Signed)
Pt. CBG 54, RN, Levada Dy made aware.

## 2019-10-13 NOTE — ED Triage Notes (Signed)
Arrived by EMS patient was outside of [facility] confused. Initial CBG was 47, EMS administered D10IV, CBG increased to 146 then decreased to 97 before arriving to this facility. EMS reports patient was confused to situation but oriented to self. Patient reports he does not know what happened between [place] and 2100 tonight. Patient says his CBG has never been this low.

## 2019-10-13 NOTE — ED Notes (Signed)
Pt. CBG 152, RN, Levada Dy made aware.

## 2019-10-13 NOTE — ED Provider Notes (Signed)
[facility] DEPT Provider Note   CSN: [id number] Arrival date & time: 10/13/19  2047     History Chief Complaint  Patient presents with  . Hypoglycemia    Calvin [name] is a 59 y.o. male hx of DM, hypertension, hyperlipidemia, nonischemic cardiomyopathy, here presenting with hypoglycemia Patient states that he takes insulin at baseline. Per his medical records, he is on Premeal insulin 3 times daily.  He is not on any long-acting insulin.  He states that he took his insulin around 11 AM.  He did not eat anything all day.  He states that he was just not hungry.  He states that he feels more confused and tired.  When EMS got there, his CBG was 41 .  He was given D10 prior to arrival.  Patient denies any fevers or vomiting  The history is provided by the patient.       Past Medical History:  Diagnosis Date  . Adenoidal hypertrophy   . Adenomatous colon polyps 2012  . Arthritis   . Diabetes mellitus   . GERD (gastroesophageal reflux disease)   . Heart attack ([place])    [place]  [name] Hx of adenomatous colonic polyps 08/18/2017  . Hyperlipidemia   . Hypertension   . Mild CAD    a. cath in 08/2017 showing mild nonobstructive CAD with scattered 20-30% stenosis.   . Nonischemic cardiomyopathy ([place])    a. EF 20-25% by echo in 09/2017 with cath showing mild CAD. b.  Last echo 12/2017 EF 35-40%, grade 2 DD.  [name] Obesity   . PAF (paroxysmal atrial fibrillation) ([place])   . Sleep apnea    cpap, pt says no longer has    Patient Active Problem List   Diagnosis Date Noted  . COVID-19 virus detected 06/18/2019  . Right sided weakness 06/17/2019  . AKI (acute kidney injury) ([place]) 06/17/2019  . Foot pain, bilateral 02/01/2019  . Abdominal pain 01/23/2019  . Chest pain 01/23/2019  . Right leg pain 12/20/2018  . Overgrown toenails 12/20/2018  . Unsteady gait 12/20/2018  . Encounter for therapeutic drug monitoring 10/22/2018  . Nausea and vomiting  03/10/2018  . AF (paroxysmal atrial fibrillation) ([place]) 03/10/2018  . Non-cardiac chest pain 03/09/2018  . Diabetic neuropathy ([place]) 09/26/2017  . MCI (mild cognitive impairment) 06/23/2017  . OSA on CPAP 05/10/2017  . Gastroesophageal reflux disease 05/10/2017  . Insomnia 05/10/2017  . Onychomycosis of multiple toenails with type 2 diabetes mellitus and peripheral neuropathy ([place]) 05/10/2017  . Chronic systolic heart failure ([place]) 11/22/2015  . Essential hypertension 11/22/2015  . DM (diabetes mellitus) ([place]) 11/20/2015  . HLD (hyperlipidemia) 11/20/2015  . Hx of adenomatous colonic polyps 05/19/2011    Past Surgical History:  Procedure Laterality Date  . COLONOSCOPY  05/13/11   9 adenomas  . FOREARM SURGERY    . MUSCLE BIOPSY    . RIGHT/LEFT HEART CATH AND CORONARY ANGIOGRAPHY N/A 08/25/2017   Procedure: RIGHT/LEFT HEART CATH AND CORONARY ANGIOGRAPHY;  Surgeon: [name], MD;  Location: [facility];  Service: Cardiovascular;  Laterality: N/A;  . RIGHT/LEFT HEART CATH AND CORONARY ANGIOGRAPHY N/A 10/01/2019   Procedure: RIGHT/LEFT HEART CATH AND CORONARY ANGIOGRAPHY;  Surgeon: [name], MD;  Location: [facility];  Service: Cardiovascular;  Laterality: N/A;       Family History  Problem Relation Age of Onset  . Heart disease Mother   . Heart attack Mother 49  . Hypertension Mother   .  Hyperlipidemia Mother   . Diabetes Mother   . Heart disease Father   . Heart attack Father 55  . Hypertension Father   . Hyperlipidemia Father   . Diabetes Father   . Heart attack Sister 65  . Colon cancer Neg Hx   . Stomach cancer Neg Hx   . Esophageal cancer Neg Hx   . Rectal cancer Neg Hx     Social History   Tobacco Use  . Smoking status: Never Smoker  . Smokeless tobacco: Never Used  Substance Use Topics  . Alcohol use: No  . Drug use: No    Home Medications Prior to Admission medications   Medication Sig Start Date End Date Taking?  Authorizing Provider  albuterol (VENTOLIN HFA) 108 (90 Base) MCG/ACT inhaler Inhale 2 puffs into the lungs every 6 (six) hours as needed for wheezing or shortness of breath. 06/30/19   Thurnell Lose, MD  amitriptyline (ELAVIL) 10 MG tablet Take 2 tablets at bed time 08/27/19   Cameron Sprang, MD  apixaban (ELIQUIS) 5 MG TABS tablet Take 5 mg by mouth 2 (two) times daily.    [provider]  carvedilol (COREG) 6.25 MG tablet Take 1 tablet (6.25 mg total) by mouth 2 (two) times daily with a meal. 07/22/19   Larey Dresser, MD  digoxin (LANOXIN) 0.125 MG tablet Take 1 tablet (0.125 mg total) by mouth daily. 08/20/19   Lyda Jester M, [name]  donepezil (ARICEPT) 10 MG tablet Take 1 tablet (10 mg total) by mouth at bedtime. 08/27/19   Cameron Sprang, MD  Evolocumab (REPATHA SURECLICK) 540 MG/ML SOAJ Inject 1 pen into the skin every 14 (fourteen) days. Patient not taking: Reported on 10/09/2019 07/22/19   [name], MD  ezetimibe (ZETIA) 10 MG tablet Take 1 tablet (10 mg total) by mouth daily. 07/22/19   [name], MD  glucose blood (ONETOUCH ULTRA) test strip 1 each by Other route 2 (two) times daily. E11.9 08/29/19   [name], MD  Insulin Glargine [facility]) 100 UNIT/ML Inject 3 mLs (300 Units total) into the skin every morning. 10/09/19   [name], MD  Insulin Syringe-Needle U-100 (INSULIN SYRINGE .3CC/29GX1/2") 29G X 1/2" 0.3 ML MISC Inject 1 Syringe 3 (three) times daily as directed. Check blood sugar three times daily. Dx: E11.9 05/10/17   [name], DO  Lancets [facility] DELICA PLUS [id number]) [place] 1 each by Does not apply route 2 (two) times daily. E11.9 08/29/19   [name], MD  lisinopril (ZESTRIL) 10 MG tablet Take 1 tablet (10 mg total) by mouth at bedtime. 07/22/19   Larey Dresser, MD  nitroGLYCERIN (NITROSTAT) 0.4 MG SL tablet Place 1 tablet (0.4 mg total) under the tongue every 5 (five) minutes as needed for chest pain. 10/27/17    [name], DO  polyethylene glycol (MIRALAX / GLYCOLAX) 17 g packet Take 17 g by mouth 2 (two) times daily. 06/30/19   Thurnell Lose, MD  senna (SENOKOT) 8.6 MG TABS tablet Take 1 tablet (8.6 mg total) by mouth daily as needed for mild constipation. 06/30/19   Thurnell Lose, MD  spironolactone (ALDACTONE) 25 MG tablet Take 1 tablet (25 mg total) by mouth daily. 09/04/19 12/03/19  Consuelo Pandy, [name]    Allergies    Patient has no known allergies.  Review of Systems   Review of Systems  Neurological: Positive for weakness.  All other systems reviewed and are negative.   Physical  Exam Updated Vital Signs BP (!) 152/95   Pulse 81   Resp 18   SpO2 100%   Physical Exam Vitals and nursing note reviewed.  Constitutional:      Comments: Tired but A & O x 3.   HENT:     Head: Normocephalic.     Nose: Nose normal.     Mouth/Throat:     Mouth: Mucous membranes are moist.  Eyes:     Extraocular Movements: Extraocular movements intact.     Pupils: Pupils are equal, round, and reactive to light.  Cardiovascular:     Rate and Rhythm: Normal rate and regular rhythm.     Pulses: Normal pulses.     Heart sounds: Normal heart sounds.  Pulmonary:     Effort: Pulmonary effort is normal.     Breath sounds: Normal breath sounds.  Abdominal:     General: Abdomen is flat.     Palpations: Abdomen is soft.  Musculoskeletal:        General: Normal range of motion.     Cervical back: Normal range of motion.  Skin:    General: Skin is warm.     Capillary Refill: Capillary refill takes less than 2 seconds.  Neurological:     General: No focal deficit present.     Mental Status: He is oriented to person, place, and time.     Comments: CN 2- 12 intact, nl strength throughout   Psychiatric:        Mood and Affect: Mood normal.     ED Results / Procedures / Treatments   Labs (all labs ordered are listed, but only abnormal results are displayed) Labs Reviewed    COMPREHENSIVE METABOLIC PANEL - Abnormal; Notable for the following components:      Result Value   Glucose, Bld 108 (*)    BUN <5 (*)    Calcium 8.7 (*)    Total Protein 6.2 (*)    All other components within normal limits  I-STAT CHEM 8, ED - Abnormal; Notable for the following components:   BUN 3 (*)    Glucose, Bld 101 (*)    All other components within normal limits  CBG MONITORING, ED - Abnormal; Notable for the following components:   Glucose-Capillary 54 (*)    All other components within normal limits  CBG MONITORING, ED - Abnormal; Notable for the following components:   Glucose-Capillary 152 (*)    All other components within normal limits  CBC WITH DIFFERENTIAL/PLATELET  CBG MONITORING, ED    EKG None  Radiology No results found.  Procedures Procedures (including critical care time)  Medications Ordered in ED Medications  dextrose 50 % solution 50 mL (50 mLs Intravenous Given 10/13/19 2116)    ED Course  I have reviewed the triage vital signs and the nursing notes.  Pertinent labs & imaging results that were available during my care of the patient were reviewed by me and considered in my medical decision making (see chart for details).    MDM Rules/Calculators/A&P                      Calvin [name] is a 59 y.o. male presenting with hypoglycemia .  Patient is on insulin and did not eat anything. He is not on long-acting insulin last took it around 11 AM.  On arrival, patient's blood sugar is 50.  Will give D50 and check labs and give oral fluids.  11:30 PM CBG is  79 now. Chemistry unremarkable. Signed out to Dr. Betsey Holiday to follow up CBG in an hour and reassess patient. If CBG stable, anticipate dc home.    Final Clinical Impression(s) / ED Diagnoses Final diagnoses:  None    Rx / DC Orders ED Discharge Orders    None       Drenda Freeze, MD 10/13/19 2330

## 2019-10-14 ENCOUNTER — Encounter: Payer: Self-pay | Admitting: Endocrinology

## 2019-10-14 ENCOUNTER — Other Ambulatory Visit: Payer: Self-pay

## 2019-10-14 ENCOUNTER — Ambulatory Visit (INDEPENDENT_AMBULATORY_CARE_PROVIDER_SITE_OTHER): Payer: Medicare Other | Admitting: Endocrinology

## 2019-10-14 ENCOUNTER — Ambulatory Visit: Payer: Medicare Other

## 2019-10-14 VITALS — BP 124/72 | HR 99 | Ht 64.0 in | Wt 161.6 lb

## 2019-10-14 DIAGNOSIS — E1140 Type 2 diabetes mellitus with diabetic neuropathy, unspecified: Secondary | ICD-10-CM

## 2019-10-14 DIAGNOSIS — Z794 Long term (current) use of insulin: Secondary | ICD-10-CM

## 2019-10-14 LAB — CBG MONITORING, ED: Glucose-Capillary: 117 mg/dL — ABNORMAL HIGH (ref 70–99)

## 2019-10-14 MED ORDER — TRESIBA FLEXTOUCH 200 UNIT/ML ~~LOC~~ SOPN: 270.0000 [IU] | PEN_INJECTOR | Freq: Every day | SUBCUTANEOUS | 11 refills | Status: DC

## 2019-10-14 NOTE — Discharge Instructions (Addendum)
Make sure that you do not skip meals if you are taking your insulin.  Follow-up with your doctor for further instructions.

## 2019-10-14 NOTE — Progress Notes (Signed)
Subjective:    Patient ID: Calvin [name], male    DOB: [birth year], 59 y.o.   MRN: [id number]  HPI Pt returns for f/u of diabetes mellitus: DM type: Insulin-requiring type 2. Dx'ed: 7341 Complications: polyneuropathy, CAD, renal insuff, and DR.   Therapy: insulin since soon after dx.  DKA: never Severe hypoglycemia: once, in 2021. Pancreatitis: never Pancreatic imaging: normal on 2003 CT. Other: due to noncompliance, he is not a candidate for multiple daily injections; he is retired; he gets Engineer, agricultural from pt assistance.   Interval history: no cbg record, but states cbg's were in the mid-200's, on Basaglar.  No recent steroid rx.  He has been on Basaglar 300/d, x 6 days.  He is uncertain what he took prior to that.  He received [place] from our office (from pt assistance) last week, and he is about to start this.  He has severe hypoglycemia yesterday evening, and again this AM.  He is uncertain why this happened.  He says paramedics found cbg 41 yesterday, and 51 today.   Past Medical History:  Diagnosis Date  . Adenoidal hypertrophy   . Adenomatous colon polyps 2012  . Arthritis   . Diabetes mellitus   . GERD (gastroesophageal reflux disease)   . Heart attack ([place])    [place]  [name] Hx of adenomatous colonic polyps 08/18/2017  . Hyperlipidemia   . Hypertension   . Mild CAD    a. cath in 08/2017 showing mild nonobstructive CAD with scattered 20-30% stenosis.   . Nonischemic cardiomyopathy ([place])    a. EF 20-25% by echo in 09/2017 with cath showing mild CAD. b.  Last echo 12/2017 EF 35-40%, grade 2 DD.  [name] Obesity   . PAF (paroxysmal atrial fibrillation) ([place])   . Sleep apnea    cpap, pt says no longer has    Past Surgical History:  Procedure Laterality Date  . COLONOSCOPY  05/13/11   9 adenomas  . FOREARM SURGERY    . MUSCLE BIOPSY    . RIGHT/LEFT HEART CATH AND CORONARY ANGIOGRAPHY N/A 08/25/2017   Procedure: RIGHT/LEFT HEART CATH AND CORONARY ANGIOGRAPHY;   Surgeon: [name], MD;  Location: [facility];  Service: Cardiovascular;  Laterality: N/A;  . RIGHT/LEFT HEART CATH AND CORONARY ANGIOGRAPHY N/A 10/01/2019   Procedure: RIGHT/LEFT HEART CATH AND CORONARY ANGIOGRAPHY;  Surgeon: [name], MD;  Location: [facility];  Service: Cardiovascular;  Laterality: N/A;    Social History   Socioeconomic History  . Marital status: Single    Spouse name: Not on file  . Number of children: 0  . Years of education: Not on file  . Highest education level: Not on file  Occupational History  . Occupation: Disability  Tobacco Use  . Smoking status: Never Smoker  . Smokeless tobacco: Never Used  Substance and Sexual Activity  . Alcohol use: No  . Drug use: No  . Sexual activity: Yes    Birth control/protection: None  Other Topics Concern  . Not on file  Social History Narrative   Social History   Diet?    Do you drink/eat things with caffeine? yes   Marital status?       single      Do you live in a house, apartment, assisted living, condo, trailer, etc.? yes   Is it one or more stories? One story   How many persons live in your home?   Do you have any pets  in your home? (please list)    Highest level of education completed? graduate   Do you exercise?            no                          Type & how often?   Advanced Directives   Do you have a living will? no   Do you have a DNR form?                                  If not, do you want to discuss one? no   Do you have signed POA/HPOA for forms? no      Functional Status   Do you have difficulty bathing or dressing yourself? no   Do you have difficulty preparing food or eating? no   Do you have difficulty managing your medications? no   Do you have difficulty managing your finances? no   Do you have difficulty affording your medications? no   Social Determinants of Health   Financial Resource Strain:   . Difficulty of Paying Living Expenses:    Food Insecurity: No Food Insecurity  . Worried About Charity fundraiser in the Last Year: Never true  . Ran Out of Food in the Last Year: Never true  Transportation Needs: Unmet Transportation Needs  . Lack of Transportation (Medical): Yes  . Lack of Transportation (Non-Medical): Yes  Physical Activity:   . Days of Exercise per Week:   . Minutes of Exercise per Session:   Stress:   . Feeling of Stress :   Social Connections:   . Frequency of Communication with Friends and Family:   . Frequency of Social Gatherings with Friends and Family:   . Attends Religious Services:   . Active Member of Clubs or Organizations:   . Attends Archivist Meetings:   [name] Marital Status:   Intimate Partner Violence:   . Fear of Current or Ex-Partner:   . Emotionally Abused:   [name] Physically Abused:   . Sexually Abused:     Current Outpatient Medications on File Prior to Visit  Medication Sig Dispense Refill  . albuterol (VENTOLIN HFA) 108 (90 Base) MCG/ACT inhaler Inhale 2 puffs into the lungs every 6 (six) hours as needed for wheezing or shortness of breath. 6.7 g 0  . amitriptyline (ELAVIL) 10 MG tablet Take 2 tablets at bed time 180 tablet 3  . apixaban (ELIQUIS) 5 MG TABS tablet Take 5 mg by mouth 2 (two) times daily.    . carvedilol (COREG) 6.25 MG tablet Take 1 tablet (6.25 mg total) by mouth 2 (two) times daily with a meal. 180 tablet 3  . digoxin (LANOXIN) 0.125 MG tablet Take 1 tablet (0.125 mg total) by mouth daily. 90 tablet 3  . donepezil (ARICEPT) 10 MG tablet Take 1 tablet (10 mg total) by mouth at bedtime. 90 tablet 3  . Evolocumab (REPATHA SURECLICK) 161 MG/ML SOAJ Inject 1 pen into the skin every 14 (fourteen) days. (Patient not taking: Reported on 10/09/2019) 6 pen 3  . ezetimibe (ZETIA) 10 MG tablet Take 1 tablet (10 mg total) by mouth daily. 90 tablet 3  . glucose blood (ONETOUCH ULTRA) test strip 1 each by Other route 2 (two) times daily. E11.9 180 each 0  . Insulin  Syringe-Needle U-100 (INSULIN SYRINGE .3CC/29GX1/2") 29G X  1/2" 0.3 ML MISC Inject 1 Syringe 3 (three) times daily as directed. Check blood sugar three times daily. Dx: E11.9 100 each 3  . Lancets (ONETOUCH DELICA PLUS [id number]) MISC 1 each by Does not apply route 2 (two) times daily. E11.9 180 each 0  . lisinopril (ZESTRIL) 10 MG tablet Take 1 tablet (10 mg total) by mouth at bedtime. 90 tablet 3  . nitroGLYCERIN (NITROSTAT) 0.4 MG SL tablet Place 1 tablet (0.4 mg total) under the tongue every 5 (five) minutes as needed for chest pain. 30 tablet 1  . polyethylene glycol (MIRALAX / GLYCOLAX) 17 g packet Take 17 g by mouth 2 (two) times daily. 14 each 0  . senna (SENOKOT) 8.6 MG TABS tablet Take 1 tablet (8.6 mg total) by mouth daily as needed for mild constipation. 15 tablet 0  . spironolactone (ALDACTONE) 25 MG tablet Take 1 tablet (25 mg total) by mouth daily. 90 tablet 3   No current facility-administered medications on file prior to visit.    No Known Allergies  Family History  Problem Relation Age of Onset  . Heart disease Mother   . Heart attack Mother 42  . Hypertension Mother   . Hyperlipidemia Mother   . Diabetes Mother   . Heart disease Father   . Heart attack Father 75  . Hypertension Father   . Hyperlipidemia Father   . Diabetes Father   . Heart attack Sister 52  . Colon cancer Neg Hx   . Stomach cancer Neg Hx   . Esophageal cancer Neg Hx   . Rectal cancer Neg Hx     BP 124/72 (BP Location: Left Arm, Patient Position: Sitting, Cuff Size: Normal)   Pulse 99   Ht 5' 4"  (1.626 m)   Wt 161 lb 9.6 oz (73.3 kg)   SpO2 99%   BMI 27.74 kg/m    Review of Systems Denies weight change.      Objective:   Physical Exam VITAL SIGNS:  See vs page GENERAL: no distress Pulses: dorsalis pedis intact bilat.   MSK: no deformity of the feet CV: no leg edema Skin:  no ulcer on the feet.  normal color and temp on the feet. Neuro: sensation is intact to touch on the feet   Lab Results  Component Value Date   HGBA1C 9.2 (A) 10/09/2019   Lab Results  Component Value Date   CREATININE 1.00 10/13/2019   BUN 3 (L) 10/13/2019   NA 139 10/13/2019   K 3.6 10/13/2019   CL 101 10/13/2019   CO2 27 10/13/2019      Assessment & Plan:  Insulin-requiring type 2 DM: poor glycemic control.  Hypoglycemia, much worse: It is critically important that he be on a consistent insulin.    Patient Instructions  Please change the Basaglar to [place], 270 units daily.  On this type of insulin, you should eat meals on a regular schedule.  If a meal is missed or significantly delayed, your blood sugar could go low.   check your blood sugar twice a day.  vary the time of day when you check, between before the 3 meals, and at bedtime.  also check if you have symptoms of your blood sugar being too high or too low.  please keep a record of the readings and bring it to your next appointment here (or you can bring the meter itself).  You can write it on any piece of paper.  please call us sooner if your  blood sugar goes below 70, or if you have a lot of readings over 200. Please come back for a follow-up appointment in 2 weeks.

## 2019-10-14 NOTE — ED Provider Notes (Signed)
Patient signed out to me by Dr. Darl Householder to follow his blood sugars.  Patient has been taking his insulin (short acting) but not eating and presented with hypoglycemia.  He was administered IV dextrose and fed and monitored.  Blood sugars have remained stable.  Patient will be appropriate for discharge   Orpah Greek, MD 10/14/19 0127

## 2019-10-14 NOTE — Patient Instructions (Addendum)
Please change the Basaglar to [place], 270 units daily.  On this type of insulin, you should eat meals on a regular schedule.  If a meal is missed or significantly delayed, your blood sugar could go low.   check your blood sugar twice a day.  vary the time of day when you check, between before the 3 meals, and at bedtime.  also check if you have symptoms of your blood sugar being too high or too low.  please keep a record of the readings and bring it to your next appointment here (or you can bring the meter itself).  You can write it on any piece of paper.  please call us sooner if your blood sugar goes below 70, or if you have a lot of readings over 200. Please come back for a follow-up appointment in 2 weeks.

## 2019-10-14 NOTE — ED Notes (Signed)
Pt. CBG 117, RN, Levada Dy made aware.

## 2019-10-15 ENCOUNTER — Encounter (HOSPITAL_COMMUNITY): Payer: Self-pay | Admitting: Emergency Medicine

## 2019-10-15 ENCOUNTER — Other Ambulatory Visit: Payer: Self-pay

## 2019-10-15 ENCOUNTER — Telehealth (HOSPITAL_COMMUNITY): Payer: Self-pay | Admitting: *Deleted

## 2019-10-15 ENCOUNTER — Inpatient Hospital Stay (HOSPITAL_COMMUNITY)
Admission: EM | Admit: 2019-10-15 | Discharge: 2019-10-19 | DRG: 918 | Disposition: A | Discharge status: Home Health | Payer: Medicare Other | Attending: Family Medicine | Admitting: Family Medicine

## 2019-10-15 ENCOUNTER — Telehealth: Payer: Self-pay

## 2019-10-15 DIAGNOSIS — I252 Old myocardial infarction: Secondary | ICD-10-CM

## 2019-10-15 DIAGNOSIS — I10 Essential (primary) hypertension: Secondary | ICD-10-CM | POA: Diagnosis present

## 2019-10-15 DIAGNOSIS — I110 Hypertensive heart disease with heart failure: Secondary | ICD-10-CM | POA: Diagnosis present

## 2019-10-15 DIAGNOSIS — G4733 Obstructive sleep apnea (adult) (pediatric): Secondary | ICD-10-CM

## 2019-10-15 DIAGNOSIS — Z7901 Long term (current) use of anticoagulants: Secondary | ICD-10-CM

## 2019-10-15 DIAGNOSIS — G309 Alzheimer's disease, unspecified: Secondary | ICD-10-CM | POA: Diagnosis present

## 2019-10-15 DIAGNOSIS — Z8616 Personal history of COVID-19: Secondary | ICD-10-CM

## 2019-10-15 DIAGNOSIS — T383X1A Poisoning by insulin and oral hypoglycemic [antidiabetic] drugs, accidental (unintentional), initial encounter: Principal | ICD-10-CM | POA: Diagnosis present

## 2019-10-15 DIAGNOSIS — I5022 Chronic systolic (congestive) heart failure: Secondary | ICD-10-CM | POA: Diagnosis not present

## 2019-10-15 DIAGNOSIS — I428 Other cardiomyopathies: Secondary | ICD-10-CM | POA: Diagnosis present

## 2019-10-15 DIAGNOSIS — I2510 Atherosclerotic heart disease of native coronary artery without angina pectoris: Secondary | ICD-10-CM | POA: Diagnosis present

## 2019-10-15 DIAGNOSIS — Z794 Long term (current) use of insulin: Secondary | ICD-10-CM

## 2019-10-15 DIAGNOSIS — K5900 Constipation, unspecified: Secondary | ICD-10-CM | POA: Diagnosis present

## 2019-10-15 DIAGNOSIS — Z79899 Other long term (current) drug therapy: Secondary | ICD-10-CM

## 2019-10-15 DIAGNOSIS — E1142 Type 2 diabetes mellitus with diabetic polyneuropathy: Secondary | ICD-10-CM | POA: Diagnosis present

## 2019-10-15 DIAGNOSIS — E160 Drug-induced hypoglycemia without coma: Secondary | ICD-10-CM

## 2019-10-15 DIAGNOSIS — Z833 Family history of diabetes mellitus: Secondary | ICD-10-CM

## 2019-10-15 DIAGNOSIS — K219 Gastro-esophageal reflux disease without esophagitis: Secondary | ICD-10-CM | POA: Diagnosis present

## 2019-10-15 DIAGNOSIS — I5042 Chronic combined systolic (congestive) and diastolic (congestive) heart failure: Secondary | ICD-10-CM | POA: Diagnosis present

## 2019-10-15 DIAGNOSIS — E162 Hypoglycemia, unspecified: Secondary | ICD-10-CM

## 2019-10-15 DIAGNOSIS — I480 Paroxysmal atrial fibrillation: Secondary | ICD-10-CM | POA: Diagnosis present

## 2019-10-15 DIAGNOSIS — F0670 Mild neurocognitive disorder due to known physiological condition without behavioral disturbance: Secondary | ICD-10-CM | POA: Diagnosis present

## 2019-10-15 DIAGNOSIS — Z9989 Dependence on other enabling machines and devices: Secondary | ICD-10-CM

## 2019-10-15 DIAGNOSIS — G3184 Mild cognitive impairment, so stated: Secondary | ICD-10-CM | POA: Diagnosis present

## 2019-10-15 DIAGNOSIS — T383X5A Adverse effect of insulin and oral hypoglycemic [antidiabetic] drugs, initial encounter: Secondary | ICD-10-CM

## 2019-10-15 DIAGNOSIS — E11649 Type 2 diabetes mellitus with hypoglycemia without coma: Secondary | ICD-10-CM | POA: Diagnosis present

## 2019-10-15 DIAGNOSIS — E785 Hyperlipidemia, unspecified: Secondary | ICD-10-CM | POA: Diagnosis present

## 2019-10-15 HISTORY — DX: Long term (current) use of anticoagulants: Z79.01

## 2019-10-15 HISTORY — DX: Adverse effect of insulin and oral hypoglycemic (antidiabetic) drugs, initial encounter: E16.0

## 2019-10-15 LAB — CBG MONITORING, ED
Glucose-Capillary: 148 mg/dL — ABNORMAL HIGH (ref 70–99)
Glucose-Capillary: <10 mg/dL — CL (ref 70–99)
Glucose-Capillary: 117 mg/dL — ABNORMAL HIGH (ref 70–99)
Glucose-Capillary: 133 mg/dL — ABNORMAL HIGH (ref 70–99)

## 2019-10-15 LAB — CBC
HCT: 45.5 % (ref 39.0–52.0)
Hemoglobin: 15 g/dL (ref 13.0–17.0)
MCH: 30.2 pg (ref 26.0–34.0)
MCHC: 33 g/dL (ref 30.0–36.0)
MCV: 91.5 fL (ref 80.0–100.0)
Platelets: 290 10*3/uL (ref 150–400)
RBC: 4.97 MIL/uL (ref 4.22–5.81)
RDW: 12 % (ref 11.5–15.5)
WBC: 8.1 10*3/uL (ref 4.0–10.5)
nRBC: 0 % (ref 0.0–0.2)

## 2019-10-15 LAB — BASIC METABOLIC PANEL
Anion gap: 8 (ref 5–15)
BUN: <5 mg/dL — ABNORMAL LOW (ref 6–20)
CO2: 28 mmol/L (ref 22–32)
Calcium: 9.1 mg/dL (ref 8.9–10.3)
Chloride: 108 mmol/L (ref 98–111)
Creatinine, Ser: 1.17 mg/dL (ref 0.61–1.24)
GFR calc Af Amer: >60 mL/min (ref >60)
GFR calc non Af Amer: >60 mL/min (ref >60)
Glucose, Bld: <20 mg/dL — CL (ref 70–99)
Potassium: 3.5 mmol/L (ref 3.5–5.1)
Sodium: 144 mmol/L (ref 135–145)

## 2019-10-15 LAB — POCT I-STAT EG7
Acid-Base Excess: 3 mmol/L — ABNORMAL HIGH (ref 0.0–2.0)
Bicarbonate: 28.6 mmol/L — ABNORMAL HIGH (ref 20.0–28.0)
Calcium, Ion: 1.21 mmol/L (ref 1.15–1.40)
HCT: 45 % (ref 39.0–52.0)
Hemoglobin: 15.3 g/dL (ref 13.0–17.0)
O2 Saturation: 93 %
Potassium: 3.5 mmol/L (ref 3.5–5.1)
Sodium: 144 mmol/L (ref 135–145)
TCO2: 30 mmol/L (ref 22–32)
pCO2, Ven: 45.3 mmHg (ref 44.0–60.0)
pH, Ven: 7.408 (ref 7.250–7.430)
pO2, Ven: 66 mmHg — ABNORMAL HIGH (ref 32.0–45.0)

## 2019-10-15 LAB — RESPIRATORY PANEL BY RT PCR (FLU A&B, COVID)
Influenza A by PCR: NEGATIVE
Influenza B by PCR: NEGATIVE
SARS Coronavirus 2 by RT PCR: NEGATIVE

## 2019-10-15 LAB — I-STAT CHEM 8, ED
BUN: 4 mg/dL — ABNORMAL LOW (ref 6–20)
Calcium, Ion: 1.2 mmol/L (ref 1.15–1.40)
Chloride: 105 mmol/L (ref 98–111)
Creatinine, Ser: 1.2 mg/dL (ref 0.61–1.24)
Glucose, Bld: <20 mg/dL — CL (ref 70–99)
HCT: 44 % (ref 39.0–52.0)
Hemoglobin: 15 g/dL (ref 13.0–17.0)
Potassium: 3.4 mmol/L — ABNORMAL LOW (ref 3.5–5.1)
Sodium: 143 mmol/L (ref 135–145)
TCO2: 31 mmol/L (ref 22–32)

## 2019-10-15 LAB — GLUCOSE, CAPILLARY
Glucose-Capillary: 130 mg/dL — ABNORMAL HIGH (ref 70–99)
Glucose-Capillary: 19 mg/dL — CL (ref 70–99)

## 2019-10-15 MED ORDER — DEXTROSE 50 % IV SOLN
INTRAVENOUS | Status: AC
  Filled 2019-10-15: qty 50

## 2019-10-15 MED ORDER — POLYETHYLENE GLYCOL 3350 17 G PO PACK
17.0000 g | PACK | Freq: Two times a day (BID) | ORAL | Status: DC
  Administered 2019-10-16 – 2019-10-19 (×8): 17 g via ORAL
  Filled 2019-10-15 (×8): qty 1

## 2019-10-15 MED ORDER — CARVEDILOL 6.25 MG PO TABS
6.2500 mg | ORAL_TABLET | Freq: Two times a day (BID) | ORAL | Status: DC
  Administered 2019-10-15 – 2019-10-19 (×9): 6.25 mg via ORAL
  Filled 2019-10-15 (×9): qty 1

## 2019-10-15 MED ORDER — STERILE WATER FOR INJECTION IJ SOLN
INTRAMUSCULAR | Status: AC
  Filled 2019-10-15: qty 10

## 2019-10-15 MED ORDER — DEXTROSE 10 % IV SOLN: INTRAVENOUS | Status: AC

## 2019-10-15 MED ORDER — DEXTROSE 10 % IV SOLN: INTRAVENOUS | Status: DC

## 2019-10-15 MED ORDER — ONDANSETRON HCL 4 MG PO TABS: 4.0000 mg | ORAL_TABLET | Freq: Four times a day (QID) | ORAL | Status: DC | PRN

## 2019-10-15 MED ORDER — EZETIMIBE 10 MG PO TABS
10.0000 mg | ORAL_TABLET | Freq: Every day | ORAL | Status: DC
  Administered 2019-10-16 – 2019-10-19 (×4): 10 mg via ORAL
  Filled 2019-10-15 (×4): qty 1

## 2019-10-15 MED ORDER — ACETAMINOPHEN 650 MG RE SUPP: 650.0000 mg | Freq: Four times a day (QID) | RECTAL | Status: DC | PRN

## 2019-10-15 MED ORDER — ZIPRASIDONE MESYLATE 20 MG IM SOLR
INTRAMUSCULAR | Status: AC
  Filled 2019-10-15: qty 20

## 2019-10-15 MED ORDER — GLUCAGON HCL RDNA (DIAGNOSTIC) 1 MG IJ SOLR
INTRAMUSCULAR | Status: AC
  Filled 2019-10-15: qty 1

## 2019-10-15 MED ORDER — LISINOPRIL 10 MG PO TABS
10.0000 mg | ORAL_TABLET | Freq: Every day | ORAL | Status: DC
  Administered 2019-10-16 – 2019-10-18 (×4): 10 mg via ORAL
  Filled 2019-10-15 (×4): qty 1

## 2019-10-15 MED ORDER — DEXTROSE 50 % IV SOLN
1.0000 | Freq: Once | INTRAVENOUS | Status: AC
  Administered 2019-10-15: 50 mL via INTRAVENOUS

## 2019-10-15 MED ORDER — SENNA 8.6 MG PO TABS: 1.0000 | ORAL_TABLET | Freq: Every day | ORAL | Status: DC | PRN

## 2019-10-15 MED ORDER — APIXABAN 5 MG PO TABS
5.0000 mg | ORAL_TABLET | Freq: Two times a day (BID) | ORAL | Status: DC
  Administered 2019-10-16 – 2019-10-19 (×8): 5 mg via ORAL
  Filled 2019-10-15 (×9): qty 1

## 2019-10-15 MED ORDER — AMITRIPTYLINE HCL 10 MG PO TABS
20.0000 mg | ORAL_TABLET | Freq: Every day | ORAL | Status: DC
  Administered 2019-10-16 – 2019-10-18 (×4): 20 mg via ORAL
  Filled 2019-10-15 (×6): qty 2

## 2019-10-15 MED ORDER — ACETAMINOPHEN 325 MG PO TABS: 650.0000 mg | ORAL_TABLET | Freq: Four times a day (QID) | ORAL | Status: DC | PRN

## 2019-10-15 MED ORDER — DIGOXIN 125 MCG PO TABS
0.1250 mg | ORAL_TABLET | Freq: Every day | ORAL | Status: DC
  Administered 2019-10-16 – 2019-10-19 (×4): 0.125 mg via ORAL
  Filled 2019-10-15 (×4): qty 1

## 2019-10-15 MED ORDER — ZIPRASIDONE MESYLATE 20 MG IM SOLR: 20.0000 mg | Freq: Once | INTRAMUSCULAR | Status: DC

## 2019-10-15 MED ORDER — ALBUTEROL SULFATE (2.5 MG/3ML) 0.083% IN NEBU: 2.5000 mg | INHALATION_SOLUTION | Freq: Four times a day (QID) | RESPIRATORY_TRACT | Status: DC | PRN

## 2019-10-15 MED ORDER — POTASSIUM CHLORIDE CRYS ER 20 MEQ PO TBCR
40.0000 meq | EXTENDED_RELEASE_TABLET | ORAL | Status: AC
  Administered 2019-10-15: 40 meq via ORAL
  Filled 2019-10-15: qty 2

## 2019-10-15 MED ORDER — DONEPEZIL HCL 10 MG PO TABS
10.0000 mg | ORAL_TABLET | Freq: Every day | ORAL | Status: DC
  Administered 2019-10-16 – 2019-10-18 (×4): 10 mg via ORAL
  Filled 2019-10-15 (×4): qty 1

## 2019-10-15 MED ORDER — SODIUM CHLORIDE 0.9 % IV SOLN: 250.0000 mL | INTRAVENOUS | Status: DC | PRN

## 2019-10-15 MED ORDER — ONDANSETRON HCL 4 MG/2ML IJ SOLN: 4.0000 mg | Freq: Once | INTRAMUSCULAR | Status: DC

## 2019-10-15 MED ORDER — SPIRONOLACTONE 25 MG PO TABS
25.0000 mg | ORAL_TABLET | Freq: Every day | ORAL | Status: DC
  Administered 2019-10-16 – 2019-10-19 (×4): 25 mg via ORAL
  Filled 2019-10-15 (×4): qty 1

## 2019-10-15 MED ORDER — SODIUM CHLORIDE 0.9% FLUSH
3.0000 mL | Freq: Two times a day (BID) | INTRAVENOUS | Status: DC
  Administered 2019-10-15 – 2019-10-19 (×4): 3 mL via INTRAVENOUS

## 2019-10-15 MED ORDER — SODIUM CHLORIDE 0.9% FLUSH: 3.0000 mL | INTRAVENOUS | Status: DC | PRN

## 2019-10-15 MED ORDER — DEXTROSE 50 % IV SOLN
25.0000 g | INTRAVENOUS | Status: AC
  Administered 2019-10-15: 25 g via INTRAVENOUS

## 2019-10-15 MED ORDER — DEXTROSE 10 % IV SOLN: 100.0000 mL | Freq: Once | INTRAVENOUS | Status: DC

## 2019-10-15 MED ORDER — ONDANSETRON HCL 4 MG/2ML IJ SOLN: 4.0000 mg | Freq: Four times a day (QID) | INTRAMUSCULAR | Status: DC | PRN

## 2019-10-15 NOTE — Telephone Encounter (Signed)
-----   Message from [name], MD sent at 10/10/2019  1:11 PM EDT ----- Repeat BMET in 2 weeks

## 2019-10-15 NOTE — ED Notes (Signed)
Patient given orange juice, crackers, and sandwich

## 2019-10-15 NOTE — Telephone Encounter (Signed)
Dr. Langston Masker called from ER and stated pt was there twice this week and is currently there with a blood sugar less than 10. Dr. Langston Masker requests that on call MD advise and call back the oncoming MD, Dr. Johnney Killian. Phone number given the Dr. Kelton Pillar, who is on call.

## 2019-10-15 NOTE — Progress Notes (Signed)
Received report from ED.

## 2019-10-15 NOTE — Telephone Encounter (Signed)
Harvie Junior, [place]  10/15/2019 8:48 AM EDT    Spoke with pt lab appt scheduled for 4/22   [name], MD  10/10/2019 1:11 PM EDT    Repeat BMET in 2 weeks

## 2019-10-15 NOTE — ED Provider Notes (Addendum)
Patient has history of of overdosing accidentally on insulin.  Ostensibly this morning took 180 units of short acting insulin between 7 and 9 AM.  Patient has responded well to treatment.  Anticipate observation for another 2 hours and reassess for possible discharge.   Charlesetta Shanks, MD 10/15/19 1537  Consult: Dr. Kelton Pillar of [facility] endocrinology advises that [name] has a 25-hour half-life.  Patient should be in observation for up to 2 days.  If admitting team has additional questions can contact Dr. Loanne Drilling tomorrow.  Consult: Dr. Tamala Julian tried hospitalist for admission.   Charlesetta Shanks, MD 10/15/19 1642

## 2019-10-15 NOTE — ED Triage Notes (Signed)
Patient arrives to ED with [facility] with complaints of AMS. Per EMS patients friends called due to new onset confusion and agitation. EMS initial blood sugar was 93 but on arrival to ED patients blood sugar was <10. IV placed and D50 ampule given. Patient now calm, alert and oriented.

## 2019-10-15 NOTE — H&P (Addendum)
History and Physical    Calvin [name]:[id number] DOB: 04-07-1961 DOA: 10/15/2019  Referring MD/NP/PA: Charlesetta Shanks, MD  PCP: [name], NP  Patient coming from: [facility]  Chief Complaint: Low blood sugar   I have personally briefly reviewed patient's old medical records in [place]   HPI: Calvin [name] is a 59 y.o. 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  He normally administers his own insulin and is followed in outpatient setting by Dr. Loanne Drilling of endocrinology.  Seen in the ED 2 days ago or hypoglycemia, but was able to be discharged home.  He recently was changed from [place] to [place].  Per review of records it appears that he had multiple office visits with Dr. Loanne Drilling.  On 4/7  he was recommended to take Basaglar 280 units daily, and was switched to [place] 270 units at office visit on 4/12 following his ED visit.  However, in asking the patient how much insulin he took he states that he took 180 units as advised this morning.  Reports taking 200 units yesterday, and 300 units the day before.  He notes that his blood sugar is usually elevated in the mornings and has been low by midmorning.  Patient notes that he does eat 3 meals daily.  He reports having blurry vision, but is able to see once he uses his glasses to administer his insulin.  He only takes his insulin once daily and does not take it more than once.  He has a home health nurse that comes and arranges his medicines.  ED Course: On admission into the emergency department patient was noted to have relatively stable vital signs.  Labs for initial glucose less than 10.  Patient was given an amp of D50 with  symptoms.  Review of Systems  Constitutional: Negative for fever and malaise/fatigue.  HENT: Negative for congestion and nosebleeds.   Eyes: Positive for blurred vision. Negative for pain.  Respiratory: Negative for cough and shortness of breath.   Cardiovascular: Negative for chest pain and leg swelling.  Gastrointestinal: Positive for constipation. Negative for nausea and vomiting.  Genitourinary: Negative for dysuria and hematuria.  Musculoskeletal: Negative for falls.  Skin: Negative for rash.  Neurological: Positive for weakness. Negative for loss of consciousness.  Psychiatric/Behavioral: Positive for memory loss. Negative for hallucinations. The patient is not nervous/anxious.     Past Medical History:  Diagnosis Date  . Adenoidal hypertrophy   . Adenomatous colon polyps 2012  . Arthritis   . Diabetes mellitus   . GERD (gastroesophageal reflux disease)   . Heart attack ([place])    [place] living in [place]  [name] Hx of adenomatous colonic polyps 08/18/2017  . Hyperlipidemia   . Hypertension   . Mild CAD    a. cath in 08/2017 showing mild nonobstructive CAD with scattered 20-30% stenosis.   . Nonischemic cardiomyopathy ([place])    a. EF 20-25% by echo in 09/2017 with cath showing mild CAD. b.  Last echo 12/2017 EF 35-40%, grade 2 DD.  [name] Obesity   . PAF (paroxysmal atrial fibrillation) ([place])   . Sleep apnea    cpap, pt says no longer has    Past Surgical History:  Procedure Laterality Date  . COLONOSCOPY  05/13/11  9 adenomas  . FOREARM SURGERY    . MUSCLE BIOPSY    . RIGHT/LEFT HEART CATH AND CORONARY ANGIOGRAPHY N/A 08/25/2017   Procedure: RIGHT/LEFT HEART CATH AND CORONARY ANGIOGRAPHY;  Surgeon: [name], MD;  Location: [facility];  Service: Cardiovascular;  Laterality: N/A;  . RIGHT/LEFT HEART CATH AND CORONARY ANGIOGRAPHY N/A 10/01/2019   Procedure: RIGHT/LEFT HEART CATH AND CORONARY ANGIOGRAPHY;  Surgeon: [name], MD;  Location: [facility];  Service: Cardiovascular;  Laterality: N/A;     reports that he has never smoked. He has never used smokeless tobacco. He reports that he does not drink alcohol or use drugs.  No Known Allergies  Family History  Problem Relation Age of Onset  . Heart disease Mother   . Heart attack Mother 62  . Hypertension Mother   . Hyperlipidemia Mother   . Diabetes Mother   . Heart disease Father   . Heart attack Father 45  . Hypertension Father   . Hyperlipidemia Father   . Diabetes Father   . Heart attack Sister 50  . Colon cancer Neg Hx   . Stomach cancer Neg Hx   . Esophageal cancer Neg Hx   . Rectal cancer Neg Hx     Prior to Admission medications   Medication Sig Start Date End Date Taking? Authorizing Provider  albuterol (VENTOLIN HFA) 108 (90 Base) MCG/ACT inhaler Inhale 2 puffs into the lungs every 6 (six) hours as needed for wheezing or shortness of breath. 06/30/19   Thurnell Lose, MD  amitriptyline (ELAVIL) 10 MG tablet Take 2 tablets at bed time Patient taking differently: Take 20 mg by mouth at bedtime.  08/27/19   Cameron Sprang, MD  apixaban (ELIQUIS) 5 MG TABS tablet Take 5 mg by mouth 2 (two) times daily.    [provider]  carvedilol (COREG) 6.25 MG tablet Take 1 tablet (6.25 mg total) by mouth 2 (two) times daily with a meal. 07/22/19   Larey Dresser, MD  digoxin (LANOXIN) 0.125 MG tablet Take 1 tablet (0.125 mg total) by mouth daily. 08/20/19   Lyda Jester M, [name]  donepezil (ARICEPT) 10 MG tablet Take 1 tablet (10 mg total) by mouth at bedtime. 08/27/19   Cameron Sprang, MD  Evolocumab (REPATHA SURECLICK) 449 MG/ML SOAJ Inject 1 pen into the skin every 14 (fourteen) days. Patient not taking: Reported on 10/09/2019 07/22/19   [name], MD  ezetimibe (ZETIA) 10 MG tablet Take 1 tablet (10 mg total) by mouth daily. 07/22/19   [name], MD  glucose blood (ONETOUCH ULTRA) test strip 1 each by Other route 2 (two) times  daily. E11.9 08/29/19   [name], MD  insulin degludec (TRESIBA FLEXTOUCH) 200 UNIT/ML FlexTouch Pen Inject 270 Units into the skin daily. 10/14/19   [name], MD  Insulin Syringe-Needle U-100 (INSULIN SYRINGE .3CC/29GX1/2") 29G X 1/2" 0.3 ML MISC Inject 1 Syringe 3 (three) times daily as directed. Check blood sugar three times daily. Dx: E11.9 Patient taking differently: Inject 1 Syringe as directed See admin instructions. Check blood sugar three times daily. Dx: E11.9  05/10/17   [name], DO  Lancets [facility] DELICA PLUS [id number]) [place] 1 each by Does not apply route 2 (two) times daily. E11.9 08/29/19   [name], MD  lisinopril (ZESTRIL) 10 MG tablet Take 1 tablet (10 mg total) by mouth at bedtime. 07/22/19   Larey Dresser, MD  nitroGLYCERIN (NITROSTAT) 0.4  MG SL tablet Place 1 tablet (0.4 mg total) under the tongue every 5 (five) minutes as needed for chest pain. 10/27/17   [name], DO  polyethylene glycol (MIRALAX / GLYCOLAX) 17 g packet Take 17 g by mouth 2 (two) times daily. 06/30/19   Thurnell Lose, MD  senna (SENOKOT) 8.6 MG TABS tablet Take 1 tablet (8.6 mg total) by mouth daily as needed for mild constipation. 06/30/19   Thurnell Lose, MD  spironolactone (ALDACTONE) 25 MG tablet Take 1 tablet (25 mg total) by mouth daily. 09/04/19 12/03/19  Consuelo Pandy, [name]    Physical Exam:  Constitutional: Middle-age male who appears to be in no acute distress at this time. Vitals:   10/15/19 1357 10/15/19 1500  BP: (!) 150/78   Pulse: 76 97  Resp: 19 (!) 22  Temp: 98 F (36.7 C)   TempSrc: Oral   SpO2: 100% 100%   Eyes: PERRL, lids and conjunctivae normal ENMT: Mucous membranes are moist. Posterior pharynx clear of any exudate or lesions.Normal dentition.  Neck: normal, supple, no masses, no thyromegaly Respiratory: clear to auscultation bilaterally, no wheezing, no crackles. Normal respiratory effort. No accessory muscle use.  Cardiovascular:  Regular rate and rhythm, no murmurs / rubs / gallops. No extremity edema. 2+ pedal pulses. No carotid bruits.  Abdomen: no tenderness, no masses palpated. No hepatosplenomegaly. Bowel sounds positive.  Musculoskeletal: no clubbing / cyanosis. No joint deformity upper and lower extremities. Good ROM, no contractures. Normal muscle tone.  Skin: no rashes, lesions, ulcers. No induration Neurologic: CN 2-12 grossly intact. Sensation intact, DTR normal. Strength 5/5 in all 4.  Psychiatric: Normal judgment and insight. Alert and oriented x 3. Normal mood.     Labs on Admission: I have personally reviewed following labs and imaging studies  CBC: Recent Labs  Lab 10/13/19 2202 10/13/19 2211 10/15/19 1345 10/15/19 1359 10/15/19 1400  WBC 4.7  --  8.1  --   --   NEUTROABS 3.6  --   --   --   --   HGB 14.7 14.6 15.0 15.0 15.3  HCT 43.6 43.0 45.5 44.0 45.0  MCV 93.0  --  91.5  --   --   PLT 203  --  290  --   --    Basic Metabolic Panel: Recent Labs  Lab 10/13/19 2202 10/13/19 2211 10/15/19 1345 10/15/19 1359 10/15/19 1400  NA 140 139 144 143 144  K 3.7 3.6 3.5 3.4* 3.5  CL 105 101 108 105  --   CO2 27  --  28  --   --   GLUCOSE 108* 101* <20* <20*  --   BUN <5* 3* <5* 4*  --   CREATININE 0.93 1.00 1.17 1.20  --   CALCIUM 8.7*  --  9.1  --   --    GFR: Estimated Creatinine Clearance: 60.8 mL/min (by C-G formula based on SCr of 1.2 mg/dL). Liver Function Tests: Recent Labs  Lab 10/13/19 2202  AST 28  ALT 28  ALKPHOS 58  BILITOT 0.4  PROT 6.2*  ALBUMIN 3.5   No results for input(s): LIPASE, AMYLASE in the last 168 hours. No results for input(s): AMMONIA in the last 168 hours. Coagulation Profile: No results for input(s): INR, PROTIME in the last 168 hours. Cardiac Enzymes: No results for input(s): CKTOTAL, CKMB, CKMBINDEX, TROPONINI in the last 168 hours. BNP (last 3 results) No results for input(s): PROBNP in the last 8760 hours. HbA1C: No results  for input(s):  HGBA1C in the last 72 hours. CBG: Recent Labs  Lab 10/13/19 2248 10/14/19 0050 10/15/19 1332 10/15/19 1349 10/15/19 1450  GLUCAP 79 117* <10* 148* 117*   Lipid Profile: No results for input(s): CHOL, HDL, LDLCALC, TRIG, CHOLHDL, LDLDIRECT in the last 72 hours. Thyroid Function Tests: No results for input(s): TSH, T4TOTAL, FREET4, T3FREE, THYROIDAB in the last 72 hours. Anemia Panel: No results for input(s): VITAMINB12, FOLATE, FERRITIN, TIBC, IRON, RETICCTPCT in the last 72 hours. Urine analysis:    Component Value Date/Time   COLORURINE YELLOW 06/17/2019 1600   APPEARANCEUR HAZY (A) 06/17/2019 1600   LABSPEC 1.023 06/17/2019 1600   PHURINE 5.0 06/17/2019 1600   GLUCOSEU >=500 (A) 06/17/2019 1600   HGBUR MODERATE (A) 06/17/2019 1600   BILIRUBINUR NEGATIVE 06/17/2019 1600   KETONESUR NEGATIVE 06/17/2019 1600   PROTEINUR 100 (A) 06/17/2019 1600   UROBILINOGEN 0.2 11/28/2012 1857   NITRITE NEGATIVE 06/17/2019 1600   LEUKOCYTESUR NEGATIVE 06/17/2019 1600   Sepsis Labs: No results found for this or any previous visit (from the past 240 hour(s)).   Radiological Exams on Admission: No results found.  EKG: Independently reviewed.  Sinus arrhythmia 73 bpm  Assessment/Plan Diabetes mellitus type 2 with hypoglycemia: Acute.  Patient initially found to have blood sugars less than 10. The patient reports only taking 180 units of Tresiba this a.m. Review of records notes that [name] was prescribed by his endocrinologist Dr. Loanne Drilling, but he was supposed to be on 270 units daily. Listening to the patient it's not clear if he is giving him self the correct amounts of insulin. -Admit to a progressive bed -Hypoglycemic protocol -CBGs every 4 hours -D10 drip at 50 mL/h with orders placed to discontinue after blood sugars greater than 200 on 2 consecutive checks at least 4 hours apart. -Given amp of D50 as needed for low blood sugars -diabetic education -Consider reaching out to Dr.  Loanne Drilling in a.m.  Paroxysmal atrial fibrillation on chronic anticoagulation: Patient currently appears to be in sinus rhythm.  On digoxin 0.125 mg daily. -Continue Eliquis and digoxin  Essential hypertension:: Home blood pressure medications include lisinopril 10 mg daily, spironolactone 25 mg daily, and Coreg 6.25 mg twice daily. -continue current regimen  Constipation: Patient reports last bowel movement was yesterday, but had required an enema to have a bowel movement. -Continue MiraLAX  Combined systolic and diastolic CHF: Patient appears to be euvolemic at this time.  Last EF noted to be around 35%. -Strict intake and output -Daily weights  Mild cognitive impairment: Patient reports that his home health aide that comes and organizes medicines.  It would seem that he is unsafe to administer any of his medications on his own. -Continue Aricept -Transitions of care consult question safety patient to give on medication  Dyslipidemia -Continue Zetia  History of Covid-19   DVT prophylaxis: Eliquis Code Status: Full Family Communication: Unable to be reached by phone at this time Disposition Plan: To be determined Consults called: None Admission status: Observation  Norval Morton MD [facility] Pager [phone]   If 7PM-7AM, please contact night-coverage [email or web address] Password [facility]  10/15/2019, 4:39 PM

## 2019-10-15 NOTE — ED Provider Notes (Signed)
[facility] EMERGENCY DEPARTMENT Provider Note  CSN: [id number] Arrival date & time: 10/15/19  1323     History Chief Complaint  Patient presents with  . Hypoglycemia    Calvin [name] is a 59 y.o. male w/ hx of DM2 on insulin presenting to the ED with confusion, hypoglycemia.  Patient brought in by the paramedics report that he had been picked up by friends in a car today noted that he was behaving strangely.  The time the paramedics arrived on scene the patient was laying prostate on the ground and was screaming intermittently at people.  There was no reported trauma prior to this.  On arrival in the ER the patient was noted to be extremely hyperglycemic with a blood sugar less than 10.  He was unable to provide a history other than to repeat "help me".  His mental status subsequently improved significantly after IV dextrose was given.   Currently he feels "weak all over."  Patient tells me that he ate breakfast this per normal this morning.  He said that after eating breakfast around 930 he gave himself an injection of Tresiba, his long acting insulin that was started by Dr [name] his endocrinologist yesterday in the office.  He says he took "180 units" which he was told to take  (of note, Dr Cordelia Pen office note recommends 270 units daily).  Patient reports been having issues this week ongoing with hypoglycemia which she says is "never been a problem for me the past".  He says he got the first round of a Covid vaccine approximately 7 days ago.  Dr Loanne Drilling notes that the patient has a history of "noncompliance with insulin" and therefore made the decision to transition him to long-acting insulin form short-acting forms.  HPI     Past Medical History:  Diagnosis Date  . Adenoidal hypertrophy   . Adenomatous colon polyps 2012  . Arthritis   . Diabetes mellitus   . GERD (gastroesophageal reflux disease)   . Heart attack ([place])    While living in [place]  [name] Hx  of adenomatous colonic polyps 08/18/2017  . Hyperlipidemia   . Hypertension   . Mild CAD    a. cath in 08/2017 showing mild nonobstructive CAD with scattered 20-30% stenosis.   . Nonischemic cardiomyopathy ([place])    a. EF 20-25% by echo in 09/2017 with cath showing mild CAD. b.  Last echo 12/2017 EF 35-40%, grade 2 DD.  [name] Obesity   . PAF (paroxysmal atrial fibrillation) ([place])   . Sleep apnea    cpap, pt says no longer has    Patient Active Problem List   Diagnosis Date Noted  . Hypoglycemia due to insulin 10/15/2019  . COVID-19 virus detected 06/18/2019  . Right sided weakness 06/17/2019  . AKI (acute kidney injury) ([place]) 06/17/2019  . Foot pain, bilateral 02/01/2019  . Abdominal pain 01/23/2019  . Chest pain 01/23/2019  . Right leg pain 12/20/2018  . Overgrown toenails 12/20/2018  . Unsteady gait 12/20/2018  . Encounter for therapeutic drug monitoring 10/22/2018  . Nausea and vomiting 03/10/2018  . AF (paroxysmal atrial fibrillation) ([place]) 03/10/2018  . Non-cardiac chest pain 03/09/2018  . Diabetic neuropathy ([place]) 09/26/2017  . MCI (mild cognitive impairment) 06/23/2017  . OSA on CPAP 05/10/2017  . Gastroesophageal reflux disease 05/10/2017  . Insomnia 05/10/2017  . Onychomycosis of multiple toenails with type 2 diabetes mellitus and peripheral neuropathy ([place]) 05/10/2017  . Chronic systolic heart failure ([place]) 11/22/2015  .  Essential hypertension 11/22/2015  . DM (diabetes mellitus) ([place]) 11/20/2015  . HLD (hyperlipidemia) 11/20/2015  . Hx of adenomatous colonic polyps 05/19/2011    Past Surgical History:  Procedure Laterality Date  . COLONOSCOPY  05/13/11   9 adenomas  . FOREARM SURGERY    . MUSCLE BIOPSY    . RIGHT/LEFT HEART CATH AND CORONARY ANGIOGRAPHY N/A 08/25/2017   Procedure: RIGHT/LEFT HEART CATH AND CORONARY ANGIOGRAPHY;  Surgeon: [name], MD;  Location: [facility];  Service: Cardiovascular;  Laterality: N/A;  . RIGHT/LEFT HEART CATH  AND CORONARY ANGIOGRAPHY N/A 10/01/2019   Procedure: RIGHT/LEFT HEART CATH AND CORONARY ANGIOGRAPHY;  Surgeon: [name], MD;  Location: [facility];  Service: Cardiovascular;  Laterality: N/A;       Family History  Problem Relation Age of Onset  . Heart disease Mother   . Heart attack Mother 87  . Hypertension Mother   . Hyperlipidemia Mother   . Diabetes Mother   . Heart disease Father   . Heart attack Father 12  . Hypertension Father   . Hyperlipidemia Father   . Diabetes Father   . Heart attack Sister 59  . Colon cancer Neg Hx   . Stomach cancer Neg Hx   . Esophageal cancer Neg Hx   . Rectal cancer Neg Hx     Social History   Tobacco Use  . Smoking status: Never Smoker  . Smokeless tobacco: Never Used  Substance Use Topics  . Alcohol use: No  . Drug use: No    Home Medications Prior to Admission medications   Medication Sig Start Date End Date Taking? Authorizing Provider  albuterol (VENTOLIN HFA) 108 (90 Base) MCG/ACT inhaler Inhale 2 puffs into the lungs every 6 (six) hours as needed for wheezing or shortness of breath. 06/30/19  Yes Thurnell Lose, MD  amitriptyline (ELAVIL) 10 MG tablet Take 2 tablets at bed time Patient taking differently: Take 20 mg by mouth at bedtime.  08/27/19  Yes Cameron Sprang, MD  apixaban (ELIQUIS) 5 MG TABS tablet Take 5 mg by mouth 2 (two) times daily.   Yes [provider]  carvedilol (COREG) 6.25 MG tablet Take 1 tablet (6.25 mg total) by mouth 2 (two) times daily with a meal. 07/22/19  Yes Larey Dresser, MD  digoxin (LANOXIN) 0.125 MG tablet Take 1 tablet (0.125 mg total) by mouth daily. 08/20/19  Yes Simmons, Brittainy M, [name]  donepezil (ARICEPT) 10 MG tablet Take 1 tablet (10 mg total) by mouth at bedtime. 08/27/19  Yes Cameron Sprang, MD  Evolocumab (REPATHA SURECLICK) 469 MG/ML SOAJ Inject 1 pen into the skin every 14 (fourteen) days. 07/22/19  Yes [name], MD  ezetimibe (ZETIA) 10  MG tablet Take 1 tablet (10 mg total) by mouth daily. 07/22/19  Yes [name], MD  glucose blood (ONETOUCH ULTRA) test strip 1 each by Other route 2 (two) times daily. E11.9 08/29/19  Yes [name], MD  insulin degludec (TRESIBA FLEXTOUCH) 200 UNIT/ML FlexTouch Pen Inject 270 Units into the skin daily. 10/14/19  Yes [name], MD  Insulin Syringe-Needle U-100 (INSULIN SYRINGE .3CC/29GX1/2") 29G X 1/2" 0.3 ML MISC Inject 1 Syringe 3 (three) times daily as directed. Check blood sugar three times daily. Dx: E11.9 Patient taking differently: Inject 1 Syringe as directed See admin instructions. Check blood sugar three times daily. Dx: E11.9  05/10/17  Yes [name], [name], DO  Lancets (ONETOUCH DELICA PLUS GEXBMW41L) [place]  1 each by Does not apply route 2 (two) times daily. E11.9 08/29/19  Yes [name], MD  lisinopril (ZESTRIL) 10 MG tablet Take 1 tablet (10 mg total) by mouth at bedtime. 07/22/19  Yes Larey Dresser, MD  nitroGLYCERIN (NITROSTAT) 0.4 MG SL tablet Place 1 tablet (0.4 mg total) under the tongue every 5 (five) minutes as needed for chest pain. 10/27/17  Yes [name], [name], DO  polyethylene glycol (MIRALAX / GLYCOLAX) 17 g packet Take 17 g by mouth 2 (two) times daily. 06/30/19  Yes Thurnell Lose, MD  senna (SENOKOT) 8.6 MG TABS tablet Take 1 tablet (8.6 mg total) by mouth daily as needed for mild constipation. 06/30/19  Yes Thurnell Lose, MD  spironolactone (ALDACTONE) 25 MG tablet Take 1 tablet (25 mg total) by mouth daily. 09/04/19 12/03/19 Yes Lyda Jester M, [name]    Allergies    Patient has no known allergies.  Review of Systems   Review of Systems  Constitutional: Negative for chills and fever.  HENT: Negative for ear pain and sore throat.   Eyes: Negative for photophobia and visual disturbance.  Respiratory: Negative for cough and shortness of breath.   Cardiovascular: Negative for chest pain and palpitations.  Gastrointestinal: Negative for nausea  and vomiting.  Genitourinary: Negative for dysuria and hematuria.  Musculoskeletal: Positive for arthralgias and myalgias.  Skin: Negative for color change and rash.  Neurological: Positive for headaches. Negative for syncope.  Psychiatric/Behavioral: Negative for agitation and confusion.  All other systems reviewed and are negative.   Physical Exam Updated Vital Signs BP (!) 164/94   Pulse 76   Temp 98 F (36.7 C) (Oral)   Resp 10   SpO2 100%   Physical Exam Vitals and nursing note reviewed.  Constitutional:      Appearance: He is well-developed. He is diaphoretic.     Comments: Diaphoretic, moaning, writhing and kicking on arrival  HENT:     Head: Normocephalic and atraumatic.     Right Ear: Tympanic membrane normal.     Left Ear: Tympanic membrane normal.  Eyes:     Conjunctiva/sclera: Conjunctivae normal.     Pupils: Pupils are equal, round, and reactive to light.  Cardiovascular:     Rate and Rhythm: Regular rhythm. Tachycardia present.     Pulses: Normal pulses.  Pulmonary:     Effort: Pulmonary effort is normal. No respiratory distress.     Breath sounds: Normal breath sounds.  Abdominal:     General: There is no distension.     Palpations: Abdomen is soft.     Tenderness: There is no abdominal tenderness.  Musculoskeletal:        General: No swelling, tenderness or deformity.     Cervical back: Neck supple.  Skin:    General: Skin is warm.  Neurological:     GCS: GCS eye subscore is 3. GCS verbal subscore is 3. GCS motor subscore is 5.     ED Results / Procedures / Treatments   Labs (all labs ordered are listed, but only abnormal results are displayed) Labs Reviewed  BASIC METABOLIC PANEL - Abnormal; Notable for the following components:      Result Value   Glucose, Bld <20 (*)    BUN <5 (*)    All other components within normal limits  CBG MONITORING, ED - Abnormal; Notable for the following components:   Glucose-Capillary <10 (*)    All other  components within normal limits  CBG MONITORING, ED -  Abnormal; Notable for the following components:   Glucose-Capillary 148 (*)    All other components within normal limits  CBG MONITORING, ED - Abnormal; Notable for the following components:   Glucose-Capillary 117 (*)    All other components within normal limits  I-STAT CHEM 8, ED - Abnormal; Notable for the following components:   Potassium 3.4 (*)    BUN 4 (*)    Glucose, Bld <20 (*)    All other components within normal limits  POCT I-STAT EG7 - Abnormal; Notable for the following components:   pO2, Ven 66.0 (*)    Bicarbonate 28.6 (*)    Acid-Base Excess 3.0 (*)    All other components within normal limits  CBC  CBC  BASIC METABOLIC PANEL  MAGNESIUM  DIGOXIN LEVEL  I-STAT VENOUS BLOOD GAS, ED  CBG MONITORING, ED    EKG None  Radiology No results found.  Procedures .Critical Care Performed by: Wyvonnia Dusky, MD Authorized by: Wyvonnia Dusky, MD   Critical care provider statement:    Critical care time (minutes):  45   Critical care was necessary to treat or prevent imminent or life-threatening deterioration of the following conditions:  Endocrine crisis   Critical care was time spent personally by me on the following activities:  Discussions with consultants, evaluation of patient's response to treatment, examination of patient, ordering and performing treatments and interventions, ordering and review of laboratory studies, ordering and review of radiographic studies, pulse oximetry, re-evaluation of patient's condition, obtaining history from patient or surrogate and review of old charts Comments:     Symptomatic hypoglycemia and altered mental status requiring multiple doses of dextrose, frequent bedside reassessment    (including critical care time)  Medications Ordered in ED Medications  sterile water (preservative free) injection (has no administration in time range)  sodium chloride flush (NS) 0.9  % injection 3 mL (has no administration in time range)  sodium chloride flush (NS) 0.9 % injection 3 mL (has no administration in time range)  0.9 %  sodium chloride infusion (has no administration in time range)  ondansetron (ZOFRAN) injection 4 mg (has no administration in time range)  acetaminophen (TYLENOL) tablet 650 mg (has no administration in time range)    Or  acetaminophen (TYLENOL) suppository 650 mg (has no administration in time range)  ondansetron (ZOFRAN) tablet 4 mg (has no administration in time range)    Or  ondansetron (ZOFRAN) injection 4 mg (has no administration in time range)  albuterol (PROVENTIL) (2.5 MG/3ML) 0.083% nebulizer solution 2.5 mg (has no administration in time range)  apixaban (ELIQUIS) tablet 5 mg (has no administration in time range)  dextrose 10 % infusion (has no administration in time range)  carvedilol (COREG) tablet 6.25 mg (6.25 mg Oral Given 10/15/19 1819)  polyethylene glycol (MIRALAX / GLYCOLAX) packet 17 g (has no administration in time range)  senna (SENOKOT) tablet 8.6 mg (has no administration in time range)  donepezil (ARICEPT) tablet 10 mg (has no administration in time range)  amitriptyline (ELAVIL) tablet 20 mg (has no administration in time range)  lisinopril (ZESTRIL) tablet 10 mg (has no administration in time range)  ezetimibe (ZETIA) tablet 10 mg (has no administration in time range)  digoxin (LANOXIN) tablet 0.125 mg (has no administration in time range)  spironolactone (ALDACTONE) tablet 25 mg (has no administration in time range)  glucagon (human recombinant) (GLUCAGEN) 1 MG injection (  Given 10/15/19 1335)  dextrose 50 % solution 50 mL (50 mLs  Intravenous Given 10/15/19 1343)  potassium chloride SA (KLOR-CON) CR tablet 40 mEq (40 mEq Oral Given 10/15/19 1743)    ED Course  I have reviewed the triage vital signs and the nursing notes.  Pertinent labs & imaging results that were available during my care of the patient were  reviewed by me and considered in my medical decision making (see chart for details).  59 yo male who presented to the ED with AMS Found to have BG < 10 on accucheck on arrival (paramedics reported BS in 90's on their check in the field) Rapidly improved mental status after receiving IM and then IV dextrose Now eating food, reporting just myalgia  No evidence of head trauma on exam  Unclear what is inciting this hypoglycemia.  He reports taking 180 units tresiba this morning around 9:30 am after eating breakfast.  His endocrinologist had recommended 270 units yesterday during his office visit.  It appears the patient has a history of insulin noncompliance in the past and has historically high blood sugars, but has had 2 ER visits this week for hypoglycemia (also seen on 10/13/19, discharged from ED with endo f/u).  I personally reviewed the patients medical records as noted above - endocrinology office notes I personally reviewed his labs including BMP, CBC, CBG, UA  Clinical Course as of Oct 15 1918  Tue Oct 15, 2019  1347 Patient arrived by EMS agitated and screaming comprehensively, writhing on the stretcher.  He was diaphoretic.  Accu-Chek here showed an undetectable blood sugar less than 10.  He was given IM glucagon as well as an amp of D50 with rapid improvement of his mental status.  He is confused but returning to baseline mental status.  Drinking OJ.  He is on short-acting insulin at home, reports he took "180 units" of insulin this morning because his sugar was "420."   [MT]  1412 Awake, mentating stilll, eating food   [MT]  1412 Glucose-Capillary(!): 148 [MT]    Clinical Course User Index [MT] Carlei Huang, Carola Rhine, MD   1600 - patient signed out to Dr Johnney Killian with plan who will continue to monitor his blood sugar and mental status.  Initially we had discussed 2 hour monitoring period, but we are attempting to reach out to his endocrinology office if possible to clarify his dosing and  the expected half-life of his medication, as [name] may have a prolonged half-life requiring further hospital observation.  Final Clinical Impression(s) / ED Diagnoses Final diagnoses:  Hypoglycemia    Rx / DC Orders ED Discharge Orders    None       Malacai Grantz, Carola Rhine, MD 10/15/19 [phone]

## 2019-10-15 NOTE — ED Notes (Signed)
Dinner tray ordered for pt

## 2019-10-15 NOTE — Progress Notes (Signed)
Hypoglycemic Event  CBG: 17  Treatment: D50 50 mL (25 gm)  Symptoms: Shaky  Follow-up CBG: Time:2337 CBG Result:130  Possible Reasons for Event: Unknown  Comments/MD notified:Mike Overby, CN notified Dr. Myna Hidalgo of CBG of 17 and Dr. Myna Hidalgo entered order for D10@75cc /hr. Will continue to monitor pt.     Candace Gallus

## 2019-10-16 ENCOUNTER — Other Ambulatory Visit: Payer: Self-pay

## 2019-10-16 DIAGNOSIS — I428 Other cardiomyopathies: Secondary | ICD-10-CM | POA: Diagnosis present

## 2019-10-16 DIAGNOSIS — T383X1A Poisoning by insulin and oral hypoglycemic [antidiabetic] drugs, accidental (unintentional), initial encounter: Secondary | ICD-10-CM | POA: Diagnosis present

## 2019-10-16 DIAGNOSIS — Z8616 Personal history of COVID-19: Secondary | ICD-10-CM | POA: Diagnosis not present

## 2019-10-16 DIAGNOSIS — E11649 Type 2 diabetes mellitus with hypoglycemia without coma: Secondary | ICD-10-CM | POA: Diagnosis present

## 2019-10-16 DIAGNOSIS — E160 Drug-induced hypoglycemia without coma: Secondary | ICD-10-CM | POA: Diagnosis not present

## 2019-10-16 DIAGNOSIS — G4733 Obstructive sleep apnea (adult) (pediatric): Secondary | ICD-10-CM | POA: Diagnosis present

## 2019-10-16 DIAGNOSIS — E1142 Type 2 diabetes mellitus with diabetic polyneuropathy: Secondary | ICD-10-CM | POA: Diagnosis present

## 2019-10-16 DIAGNOSIS — I10 Essential (primary) hypertension: Secondary | ICD-10-CM | POA: Diagnosis not present

## 2019-10-16 DIAGNOSIS — G3184 Mild cognitive impairment, so stated: Secondary | ICD-10-CM | POA: Diagnosis present

## 2019-10-16 DIAGNOSIS — I2510 Atherosclerotic heart disease of native coronary artery without angina pectoris: Secondary | ICD-10-CM | POA: Diagnosis present

## 2019-10-16 DIAGNOSIS — Z79899 Other long term (current) drug therapy: Secondary | ICD-10-CM | POA: Diagnosis not present

## 2019-10-16 DIAGNOSIS — K5900 Constipation, unspecified: Secondary | ICD-10-CM | POA: Diagnosis present

## 2019-10-16 DIAGNOSIS — Z7901 Long term (current) use of anticoagulants: Secondary | ICD-10-CM | POA: Diagnosis not present

## 2019-10-16 DIAGNOSIS — I5022 Chronic systolic (congestive) heart failure: Secondary | ICD-10-CM | POA: Diagnosis not present

## 2019-10-16 DIAGNOSIS — I110 Hypertensive heart disease with heart failure: Secondary | ICD-10-CM | POA: Diagnosis present

## 2019-10-16 DIAGNOSIS — Z833 Family history of diabetes mellitus: Secondary | ICD-10-CM | POA: Diagnosis not present

## 2019-10-16 DIAGNOSIS — I252 Old myocardial infarction: Secondary | ICD-10-CM | POA: Diagnosis not present

## 2019-10-16 DIAGNOSIS — Z794 Long term (current) use of insulin: Secondary | ICD-10-CM | POA: Diagnosis not present

## 2019-10-16 DIAGNOSIS — E785 Hyperlipidemia, unspecified: Secondary | ICD-10-CM | POA: Diagnosis present

## 2019-10-16 DIAGNOSIS — I480 Paroxysmal atrial fibrillation: Secondary | ICD-10-CM | POA: Diagnosis not present

## 2019-10-16 DIAGNOSIS — E162 Hypoglycemia, unspecified: Secondary | ICD-10-CM | POA: Diagnosis present

## 2019-10-16 DIAGNOSIS — K219 Gastro-esophageal reflux disease without esophagitis: Secondary | ICD-10-CM | POA: Diagnosis present

## 2019-10-16 DIAGNOSIS — I5042 Chronic combined systolic (congestive) and diastolic (congestive) heart failure: Secondary | ICD-10-CM | POA: Diagnosis present

## 2019-10-16 LAB — DIGOXIN LEVEL: Digoxin Level: 0.4 ng/mL — ABNORMAL LOW (ref 0.8–2.0)

## 2019-10-16 LAB — CBC
HCT: 41.3 % (ref 39.0–52.0)
Hemoglobin: 14 g/dL (ref 13.0–17.0)
MCH: 30.8 pg (ref 26.0–34.0)
MCHC: 33.9 g/dL (ref 30.0–36.0)
MCV: 90.8 fL (ref 80.0–100.0)
Platelets: 252 10*3/uL (ref 150–400)
RBC: 4.55 MIL/uL (ref 4.22–5.81)
RDW: 12.1 % (ref 11.5–15.5)
WBC: 7.8 10*3/uL (ref 4.0–10.5)
nRBC: 0 % (ref 0.0–0.2)

## 2019-10-16 LAB — BASIC METABOLIC PANEL
Anion gap: 9 (ref 5–15)
BUN: 8 mg/dL (ref 6–20)
CO2: 26 mmol/L (ref 22–32)
Calcium: 8.7 mg/dL — ABNORMAL LOW (ref 8.9–10.3)
Chloride: 103 mmol/L (ref 98–111)
Creatinine, Ser: 1.05 mg/dL (ref 0.61–1.24)
GFR calc Af Amer: >60 mL/min (ref >60)
GFR calc non Af Amer: >60 mL/min (ref >60)
Glucose, Bld: 146 mg/dL — ABNORMAL HIGH (ref 70–99)
Potassium: 3.9 mmol/L (ref 3.5–5.1)
Sodium: 138 mmol/L (ref 135–145)

## 2019-10-16 LAB — GLUCOSE, CAPILLARY
Glucose-Capillary: 157 mg/dL — ABNORMAL HIGH (ref 70–99)
Glucose-Capillary: 106 mg/dL — ABNORMAL HIGH (ref 70–99)
Glucose-Capillary: 130 mg/dL — ABNORMAL HIGH (ref 70–99)
Glucose-Capillary: 131 mg/dL — ABNORMAL HIGH (ref 70–99)
Glucose-Capillary: 106 mg/dL — ABNORMAL HIGH (ref 70–99)

## 2019-10-16 LAB — MRSA PCR SCREENING: MRSA by PCR: NEGATIVE

## 2019-10-16 LAB — MAGNESIUM: Magnesium: 1.9 mg/dL (ref 1.7–2.4)

## 2019-10-16 MED ORDER — DEXTROSE 10 % IV SOLN: INTRAVENOUS | Status: DC

## 2019-10-16 MED ORDER — DEXTROSE 10 % IV SOLN: INTRAVENOUS | Status: AC

## 2019-10-16 MED ORDER — SENNA 8.6 MG PO TABS
1.0000 | ORAL_TABLET | Freq: Every day | ORAL | Status: DC
  Administered 2019-10-16 – 2019-10-18 (×3): 8.6 mg via ORAL
  Filled 2019-10-16 (×3): qty 1

## 2019-10-16 NOTE — Progress Notes (Signed)
PROGRESS NOTE  Calvin [name]  LEX:[id number] DOB: 1961/02/03 DOA: 10/15/2019 PCP: [name], NP  Outpatient Specialists: Endocrinology, Dr. Loanne Drilling Brief Narrative: Calvin [name] is a 59 y.o. 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 These hypoglycemic episodes continued despite decreasing insulin from 280u daily to 180u daily, last taken on the morning of arrival. Initial labs revealed glucose <10 for which dextrose pushes were given. Despite holding insulin, giving D10 continuously, and eating 100% meals, blood sugar remains in low 100's.  Assessment & Plan: Principal Problem:   Hypoglycemia due to insulin Active Problems:   HLD (hyperlipidemia)   Chronic systolic heart failure (HCC)   Essential hypertension   OSA on CPAP   MCI (mild cognitive impairment)   AF (paroxysmal atrial fibrillation) (HCC)   Chronic anticoagulation  T2DM with severe hypogylcemia:  - Continue checking CBGs with dextrose infusion until CBGs come up. Eating well, will give D50 prn as well.  - Once CBGs coming up, plan to start back basal insulin and additional bolus novolog.  - Diabetes coordinator appreciated  PAF:  - Continue digoxin and eliquis  Chronic combined HFrEF:  - Strict I/O, daily weights.  - Given need for continuous dextrose, monitoring volume status closely, no indication for diuretic at this time.  Constipation: LBM 4/12 - Miralax, senna added  HTN:  - Continue coreg, lisinopril, spironolactone.   Mild cognitive impairment:  - Will benefit from continued home health RN, pt's god-daughter to assist with medications as well.   Dyslipidemia:  - Continue zetia.   DVT prophylaxis:  Eliquis Code Status: Full Family Communication: None at bedside Disposition Plan:  Status is: Inpatient  Remains inpatient appropriate because:Persistent severe electrolyte disturbances and IV treatments appropriate due to intensity of illness or inability to take PO   Dispo: The patient is from: Home              Anticipated d/c is to: Home              Anticipated d/c date is: 1 day              Patient currently is not medically stable to d/c.  Consultants:   Diabetes coordinator  Procedures:   None  Antimicrobials:  None   Subjective: Feels fine now, felt terrible when sugar was low. Having no chest pain, shortness of breath, leg swelling, or N/V/D.   Objective: Vitals:   10/16/19 0800 10/16/19 0900 10/16/19 1200 10/16/19 1359  BP: 124/65   (!) 115/55  Pulse: 92 87 93 89  Resp: 16 12 17 20   Temp:    98.2 F (36.8 C)  TempSrc:    Oral  SpO2: 100% 100% 100% 100%  Weight:      Height:        Intake/Output Summary (Last 24 hours) at 10/16/2019 1600 Last data filed at 10/16/2019 1435 Gross per 24 hour  Intake 1469.21 ml  Output 1200 ml  Net 269.21 ml   Filed Weights   10/15/19 2313 10/16/19 0348  Weight: 72.6 kg 73 kg    Gen: 59 y.o. male in no distress  Pulm: Non-labored breathing room air. Clear to auscultation bilaterally.  CV: Regular rate and rhythm. No murmur, rub, or gallop. No JVD, no pedal edema. GI:  Abdomen soft, non-tender, non-distended, with normoactive bowel sounds. No organomegaly or masses felt. Ext: Warm, no deformities Skin: No rashes, lesions or ulcers Neuro: Alert and oriented. No focal neurological deficits. Psych: Judgement and insight appear normal. Mood & affect appropriate.   Data Reviewed: I have personally reviewed following labs and imaging studies  CBC: Recent Labs  Lab 10/13/19 2202 10/13/19 2202 10/13/19 2211 10/15/19 1345 10/15/19 1359 10/15/19 1400 10/16/19 0403  WBC 4.7  --   --  8.1  --   --  7.8  NEUTROABS  3.6  --   --   --   --   --   --   HGB 14.7   < > 14.6 15.0 15.0 15.3 14.0  HCT 43.6   < > 43.0 45.5 44.0 45.0 41.3  MCV 93.0  --   --  91.5  --   --  90.8  PLT 203  --   --  290  --   --  252   < > = values in this interval not displayed.   Basic Metabolic Panel: Recent Labs  Lab 10/13/19 2202 10/13/19 2202 10/13/19 2211 10/15/19 1345 10/15/19 1359 10/15/19 1400 10/16/19 0403  NA 140   < > 139 144 143 144 138  K 3.7   < > 3.6 3.5 3.4* 3.5 3.9  CL 105  --  101 108 105  --  103  CO2 27  --   --  28  --   --  26  GLUCOSE 108*  --  101* <20* <20*  --  146*  BUN <5*  --  3* <5* 4*  --  8  CREATININE 0.93  --  1.00 1.17 1.20  --  1.05  CALCIUM 8.7*  --   --  9.1  --   --  8.7*  MG  --   --   --   --   --   --  1.9   < > = values in this interval not displayed.   GFR: Estimated Creatinine Clearance: 69.3 mL/min (by C-G formula based on SCr of 1.05 mg/dL). Liver Function Tests: Recent Labs  Lab 10/13/19 2202  AST 28  ALT 28  ALKPHOS 58  BILITOT 0.4  PROT 6.2*  ALBUMIN 3.5   No results for input(s): LIPASE, AMYLASE in the last 168 hours. No results for input(s): AMMONIA in the last 168 hours. Coagulation Profile: No results for input(s): INR, PROTIME in the last 168 hours. Cardiac Enzymes: No results for input(s): CKTOTAL, CKMB, CKMBINDEX, TROPONINI in the last 168 hours. BNP (last 3 results) No results for input(s): PROBNP in the last 8760 hours. HbA1C: No results for input(s): HGBA1C in the last 72 hours. CBG: Recent Labs  Lab 10/15/19 2319 10/15/19 2337 10/16/19 0352 10/16/19 0736 10/16/19 1205  GLUCAP 19* 130* 157* 106* 130*   Lipid Profile: No results for input(s): CHOL, HDL, LDLCALC, TRIG, CHOLHDL, LDLDIRECT in the last 72 hours. Thyroid Function Tests: No results for input(s): TSH, T4TOTAL, FREET4, T3FREE, THYROIDAB in the last 72 hours. Anemia Panel: No results for input(s): VITAMINB12, FOLATE, FERRITIN, TIBC, IRON, RETICCTPCT in the last 72  hours. Urine analysis:    Component Value Date/Time   COLORURINE YELLOW 06/17/2019 1600   APPEARANCEUR HAZY (A) 06/17/2019 1600   LABSPEC 1.023 06/17/2019 1600   PHURINE 5.0 06/17/2019 1600   GLUCOSEU >=500 (A) 06/17/2019 1600   HGBUR MODERATE (A) 06/17/2019 1600   BILIRUBINUR NEGATIVE 06/17/2019 1600   KETONESUR NEGATIVE  06/17/2019 1600   PROTEINUR 100 (A) 06/17/2019 1600   UROBILINOGEN 0.2 11/28/2012 1857   NITRITE NEGATIVE 06/17/2019 1600   LEUKOCYTESUR NEGATIVE 06/17/2019 1600   Recent Results (from the past 240 hour(s))  Respiratory Panel by RT PCR (Flu A&B, Covid) - Nasopharyngeal Swab     Status: None   Collection Time: 10/15/19 10:42 PM   Specimen: Nasopharyngeal Swab  Result Value Ref Range Status   SARS Coronavirus 2 by RT PCR NEGATIVE NEGATIVE Final    Comment: (NOTE) SARS-CoV-2 target nucleic acids are NOT DETECTED. The SARS-CoV-2 RNA is generally detectable in upper respiratoy specimens during the acute phase of infection. The lowest concentration of SARS-CoV-2 viral copies this assay can detect is 131 copies/mL. A negative result does not preclude SARS-Cov-2 infection and should not be used as the sole basis for treatment or other patient management decisions. A negative result may occur with  improper specimen collection/handling, submission of specimen other than nasopharyngeal swab, presence of viral mutation(s) within the areas targeted by this assay, and inadequate number of viral copies (<131 copies/mL). A negative result must be combined with clinical observations, patient history, and epidemiological information. The expected result is Negative. Fact Sheet for Patients:  PinkCheek.be Fact Sheet for Healthcare Providers:  GravelBags.it This test is not yet ap proved or cleared by the [place] FDA and  has been authorized for detection and/or diagnosis of SARS-CoV-2 by FDA under an Emergency  Use Authorization (EUA). This EUA will remain  in effect (meaning this test can be used) for the duration of the COVID-19 declaration under Section 564(b)(1) of the Act, 21 U.S.C. section 360bbb-3(b)(1), unless the authorization is terminated or revoked sooner.    Influenza A by PCR NEGATIVE NEGATIVE Final   Influenza B by PCR NEGATIVE NEGATIVE Final    Comment: (NOTE) The Xpert Xpress SARS-CoV-2/FLU/RSV assay is intended as an aid in  the diagnosis of influenza from Nasopharyngeal swab specimens and  should not be used as a sole basis for treatment. Nasal washings and  aspirates are unacceptable for Xpert Xpress SARS-CoV-2/FLU/RSV  testing. Fact Sheet for Patients: PinkCheek.be Fact Sheet for Healthcare Providers: GravelBags.it This test is not yet approved or cleared by the [place] FDA and  has been authorized for detection and/or diagnosis of SARS-CoV-2 by  FDA under an Emergency Use Authorization (EUA). This EUA will remain  in effect (meaning this test can be used) for the duration of the  Covid-19 declaration under Section 564(b)(1) of the Act, 21  U.S.C. section 360bbb-3(b)(1), unless the authorization is  terminated or revoked. Performed at [facility], [place] [address], [place], [place] [address]   MRSA PCR Screening     Status: None   Collection Time: 10/16/19 12:17 AM   Specimen: Nasal Mucosa; Nasopharyngeal  Result Value Ref Range Status   MRSA by PCR NEGATIVE NEGATIVE Final    Comment:        The GeneXpert MRSA Assay (FDA approved for NASAL specimens only), is one component of a comprehensive MRSA colonization surveillance program. It is not intended to diagnose MRSA infection nor to guide or monitor treatment for MRSA infections. Performed at [facility], [place] [address], [place], [place] [address]       Radiology Studies: No results found.  Scheduled Meds: . amitriptyline  20 mg  Oral QHS  . apixaban  5 mg Oral BID  . carvedilol  6.25 mg Oral BID WC  . digoxin  0.125 mg Oral  Daily  . donepezil  10 mg Oral QHS  . ezetimibe  10 mg Oral Daily  . lisinopril  10 mg Oral QHS  . ondansetron  4 mg Intravenous Once  . polyethylene glycol  17 g Oral BID  . sodium chloride flush  3 mL Intravenous Q12H  . spironolactone  25 mg Oral Daily   Continuous Infusions: . sodium chloride    . dextrose       LOS: 0 days   Time spent: 25 minutes.  Patrecia Pour, MD [facility] [email or web address] 10/16/2019, 4:00 PM

## 2019-10-16 NOTE — Discharge Instructions (Addendum)
Information on my medicine - ELIQUIS (apixaban)  Why was Eliquis prescribed for you? Eliquis was prescribed for you to reduce the risk of a blood clot forming that can cause a stroke if you have a medical condition called atrial fibrillation (a type of irregular heartbeat).  What do You need to know about Eliquis ? Take your Eliquis TWICE DAILY - one tablet in the morning and one tablet in the evening with or without food. If you have difficulty swallowing the tablet whole please discuss with your pharmacist how to take the medication safely.  Take Eliquis exactly as prescribed by your doctor and DO NOT stop taking Eliquis without talking to the doctor who prescribed the medication.  Stopping may increase your risk of developing a stroke.  Refill your prescription before you run out.  After discharge, you should have regular check-up appointments with your healthcare provider that is prescribing your Eliquis.  In the future your dose may need to be changed if your kidney function or weight changes by a significant amount or as you get older.  What do you do if you miss a dose? If you miss a dose, take it as soon as you remember on the same day and resume taking twice daily.  Do not take more than one dose of ELIQUIS at the same time to make up a missed dose.  Important Safety Information A possible side effect of Eliquis is bleeding. You should call your healthcare provider right away if you experience any of the following: ? Bleeding from an injury or your nose that does not stop. ? Unusual colored urine (red or dark brown) or unusual colored stools (red or black). ? Unusual bruising for unknown reasons. ? A serious fall or if you hit your head (even if there is no bleeding).  Some medicines may interact with Eliquis and might increase your risk of bleeding or clotting while on Eliquis. To help avoid this, consult your healthcare provider or pharmacist prior to using any new  prescription or non-prescription medications, including herbals, vitamins, non-steroidal anti-inflammatory drugs (NSAIDs) and supplements.  This website has more information on Eliquis (apixaban): http://www.eliquis.com/eliquis/home  Local Endocrinologists  [facility] [phone]) 1. Dr. Delrae Rend [facility] [phone]) 1. Dr. Jacelyn Pi 2. Dr. Anda Kraft [facility] [phone] 1. Dr. Daneil D[facility] [phone]) [[place] office]  [phone]) [[place] office] 1. Dr. Lenna Sciara [place] 2. Dr. Mee Hives [facility]) [phone]) 1. Autumn Hudnall Ronnald Ramp), PA 2. Dr. Amalia Greenhouse 3. Dr. Marsh Dolly. [facility] [phone]) 1. Dr. Glade Lloyd [facility] [phone]) 1. Dr. Orville Govern 2. Dr. Lelon Huh 3. Dr. Jerelene Redden 4. Alwyn Ren, FNP Dr. Carolynn Serve. [facility] in [place] [phone]) Hemoglobin A1c Test Why am I having this test? You may have the hemoglobin A1c test (HbA1c test) done to:  Evaluate your risk for developing diabetes (diabetes mellitus).  Diagnose diabetes.  Monitor long-term control of blood sugar (glucose) in people who have diabetes and help make treatment decisions. This test may be done with other blood glucose tests, such as fasting blood glucose and oral glucose tolerance tests. What is being tested? Hemoglobin is a type of protein in the blood that carries oxygen. Glucose attaches to hemoglobin to form glycated hemoglobin. This test checks the amount of glycated hemoglobin in your blood, which is a good indicator of the average amount of glucose in your blood during the  past 2-3 months. What kind of sample is taken?  A blood sample is required for this test. It is usually collected by inserting a needle into a blood vessel. Tell a health care  provider about:  All medicines you are taking, including vitamins, herbs, eye drops, creams, and over-the-counter medicines.  Any blood disorders you have.  Any surgeries you have had.  Any medical conditions you have.  Whether you are pregnant or may be pregnant. How are the results reported? Your results will be reported as a percentage that indicates how much of your hemoglobin has glucose attached to it (is glycated). Your health care provider will compare your results to normal ranges that were established after testing a large group of people (reference ranges). Reference ranges may vary among labs and hospitals. For this test, common reference ranges are:  Adult or child without diabetes: 4-5.6%.  Adult or child with diabetes and good blood glucose control: less than 7%. What do the results mean? If you have diabetes:  A result of less than 7% is considered normal, meaning that your blood glucose is well controlled.  A result higher than 7% means that your blood glucose is not well controlled, and your treatment plan may need to be adjusted. If you do not have diabetes:  A result within the reference range is considered normal, meaning that you are not at high risk for diabetes.  A result of 5.7-6.4% means that you have a high risk of developing diabetes, and you may have prediabetes. Prediabetes is the condition of having a blood glucose level that is higher than it should be, but not high enough for you to be diagnosed with diabetes. Having prediabetes puts you at risk for developing type 2 diabetes (type 2 diabetes mellitus). You may have more tests, including a repeat HbA1c test.  Results of 6.5% or higher on two separate HbA1c tests mean that you have diabetes. You may have more tests to confirm the diagnosis. Abnormally low HbA1c values may be caused by:  Pregnancy.  Severe blood loss.  Receiving donated blood (transfusions).  Low red blood cell count  (anemia).  Long-term kidney failure.  Some unusual forms (variants) of hemoglobin. Talk with your health care provider about what your results mean. Questions to ask your health care provider Ask your health care provider, or the department that is doing the test:  When will my results be ready?  How will I get my results?  What are my treatment options?  What other tests do I need?  What are my next steps? Summary  The hemoglobin A1c test (HbA1c test) may be done to evaluate your risk for developing diabetes, to diagnose diabetes, and to monitor long-term control of blood sugar (glucose) in people who have diabetes and help make treatment decisions.  Hemoglobin is a type of protein in the blood that carries oxygen. Glucose attaches to hemoglobin to form glycated hemoglobin. This test checks the amount of glycated hemoglobin in your blood, which is a good indicator of the average amount of glucose in your blood during the past 2-3 months.  Talk with your health care provider about what your results mean. This information is not intended to replace advice given to you by your health care provider. Make sure you discuss any questions you have with your health care provider. Document Revised: 06/02/2017 Document Reviewed: 01/31/2017 Elsevier Patient Education  [place].   Hypoglycemia Hypoglycemia is when the sugar (glucose) level in your blood  is too low. Signs of low blood sugar may include:  Feeling: ? Hungry. ? Worried or nervous (anxious). ? Sweaty and clammy. ? Confused. ? Dizzy. ? Sleepy. ? Sick to your stomach (nauseous).  Having: ? A fast heartbeat. ? A headache. ? A change in your vision. ? Tingling or no feeling (numbness) around your mouth, lips, or tongue. ? Jerky movements that you cannot control (seizure).  Having trouble with: ? Moving (coordination). ? Sleeping. ? Passing out (fainting). ? Getting upset easily (irritability). Low blood  sugar can happen to people who have diabetes and people who do not have diabetes. Low blood sugar can happen quickly, and it can be an emergency. Treating low blood sugar Low blood sugar is often treated by eating or drinking something sugary right away, such as:  Fruit juice, 4-6 oz (120-150 mL).  Regular soda (not diet soda), 4-6 oz (120-150 mL).  Low-fat milk, 4 oz (120 mL).  Several pieces of hard candy.  Sugar or honey, 1 Tbsp (15 mL). Treating low blood sugar if you have diabetes If you can think clearly and swallow safely, follow the 15:15 rule:  Take 15 grams of a fast-acting carb (carbohydrate). Talk with your doctor about how much you should take.  Always keep a source of fast-acting carb with you, such as: ? Sugar tablets (glucose pills). Take 3-4 pills. ? 6-8 pieces of hard candy. ? 4-6 oz (120-150 mL) of fruit juice. ? 4-6 oz (120-150 mL) of regular (not diet) soda. ? 1 Tbsp (15 mL) honey or sugar.  Check your blood sugar 15 minutes after you take the carb.  If your blood sugar is still at or below 70 mg/dL (3.9 mmol/L), take 15 grams of a carb again.  If your blood sugar does not go above 70 mg/dL (3.9 mmol/L) after 3 tries, get help right away.  After your blood sugar goes back to normal, eat a meal or a snack within 1 hour.  Treating very low blood sugar If your blood sugar is at or below 54 mg/dL (3 mmol/L), you have very low blood sugar (severe hypoglycemia). This may also cause:  Passing out.  Jerky movements you cannot control (seizure).  Losing consciousness (coma). This is an emergency. Do not wait to see if the symptoms will go away. Get medical help right away. Call your local emergency services (911 in the [place] Do not drive yourself to the hospital. If you have very low blood sugar and you cannot eat or drink, you may need a glucagon shot (injection). A family member or friend should learn how to check your blood sugar and how to give you a  glucagon shot. Ask your doctor if you need to have a glucagon shot kit at home. Follow these instructions at home: General instructions  Take over-the-counter and prescription medicines only as told by your doctor.  Stay aware of your blood sugar as told by your doctor.  Limit alcohol intake to no more than 1 drink a day for nonpregnant women and 2 drinks a day for men. One drink equals 12 oz of beer (355 mL), 5 oz of wine (148 mL), or 1 oz of hard liquor (44 mL).  Keep all follow-up visits as told by your doctor. This is important. If you have diabetes:   Follow your diabetes care plan as told by your doctor. Make sure you: ? Know the signs of low blood sugar. ? Take your medicines as told. ? Follow your exercise  and meal plan. ? Eat on time. Do not skip meals. ? Check your blood sugar as often as told by your doctor. Always check it before and after exercise. ? Follow your sick day plan when you cannot eat or drink normally. Make this plan ahead of time with your doctor.  Share your diabetes care plan with: ? Your work or school. ? People you live with.  Check your pee (urine) for ketones: ? When you are sick. ? As told by your doctor.  Carry a card or wear jewelry that says you have diabetes. Contact a doctor if:  You have trouble keeping your blood sugar in your target range.  You have low blood sugar often. Get help right away if:  You still have symptoms after you eat or drink something sugary.  Your blood sugar is at or below 54 mg/dL (3 mmol/L).  You have jerky movements that you cannot control.  You pass out. These symptoms may be an emergency. Do not wait to see if the symptoms will go away. Get medical help right away. Call your local emergency services (911 in the [place] Do not drive yourself to the hospital. Summary  Hypoglycemia happens when the level of sugar (glucose) in your blood is too low.  Low blood sugar can happen to people who have diabetes  and people who do not have diabetes. Low blood sugar can happen quickly, and it can be an emergency.  Make sure you know the signs of low blood sugar and know how to treat it.  Always keep a source of sugar (fast-acting carb) with you to treat low blood sugar. This information is not intended to replace advice given to you by your health care provider. Make sure you discuss any questions you have with your health care provider. Document Revised: 10/11/2018 Document Reviewed: 07/24/2015 Elsevier Patient Education  2020 [employer].

## 2019-10-16 NOTE — Progress Notes (Signed)
Inpatient Diabetes Program Recommendations  AACE/ADA: New Consensus Statement on Inpatient Glycemic Control (2015)  Target Ranges:  Prepandial:   less than 140 mg/dL      Peak postprandial:   less than 180 mg/dL (1-2 hours)      Critically ill patients:  140 - 180 mg/dL   Lab Results  Component Value Date   GLUCAP 106 (H) 10/16/2019   HGBA1C 9.2 (A) 10/09/2019    Review of Glycemic Control Results for Calvin [name], Calvin [name] (MRN [id number]) as of 10/16/2019 11:53  Ref. Range 10/15/2019 23:15 10/15/2019 23:19 10/15/2019 23:37 10/16/2019 03:52  Glucose-Capillary Latest Ref Range: 70 - 99 mg/dL 17 (LL) 19 (LL) 130 (H) 157 (H)   Diabetes history: Type 2 DM Outpatient Diabetes medications: Tresiba 280 units QD Current orders for Inpatient glycemic control: none, CBG Q4H D10% @ 75 ml/hr  Inpatient Diabetes Program Recommendations:    Noted significant hypoglycemic events occurring 4/11 & 4/13. Both following administration of Tresiba. On 4/11, patient administered dose of 280 units of Tresiba. Dr Loanne Drilling contacted and patient instructed to decrease to 180 units QD and subsequently experienced another event. Patient reports eating well on both days of occurrence. At this time, would continue with current management. Once CBGs exceed 180 mg/dL would discontinue IV Dextrose and consider adding Levemir 12 units QD (73 kg x 0.17) and Novolog 0-6 units TID & HS. Anticipate further adjustment on 4/15. Discussed case with Dr Bonner Puna.  Spoke with patient regarding outpatient diabetes management. Patient was last seen by Dr Loanne Drilling, outpatient endocrinology on 10/14/2019 for hypoglycemia following ED visit on 4/11. Per patient, he was instructed to decrease dose of Tresiba from 280 units down to 180 units. MD note, does not reflect this change. Instead, states they discussed changing from [place] to [place]. Regardless, patient administered Tresiba 180 units following ED visit and continued to experience  hypoglycemia, thus readmitted.  In talking with the patient, he does not seem confused and his report of dosages remain consistent and concise.  Reviewed patient's current A1c of 9.2%. Explained what a A1c is and what it measures. Also reviewed goal A1c with patient, importance of good glucose control @ home, and blood sugar goals. Reviewed patho of DM, need for insulin, increased risk for mortality with recurrent hypoglycemic events, vascular changes and comorbidites.  Patient has a meter and usually checks 1-2 times per day. Reports that when he checks it has been 400's mg/dL. Reports over the last week, he has been more hungry than usual. Denies having issues with hypoglycemia in the past. Education provided on the importance of obtaining A1C goals, while avoiding hypoglycemia from a safety standpoint. Also, discussed the possible need for insulin regimen adjustment. Patient had been on 70/30 in the past, however, was changed d/t occassionally missing doses. Patient has 3 month supply of Tresiba, so would recommend decreasing dose at discharge and feel patient could add a set dose of Novolog with meals using a phone timer. Patient feels confident that this would work and would eliminate the possibility for lows or missed doses. Patient reports that he has help now compared to before and after this "scare" he will do better. Discussed [employer] [employer] per patient request and is interested. Patient requesting endocrinology list and plans to follow up and make appointment.  Will continue to follow.  Thanks, Bronson Curb, MSN, CDE, RNC-OB Diabetes Coordinator [phone] (8a-5p)

## 2019-10-16 NOTE — Progress Notes (Signed)
Pt arrived to 5w34 from ED. Pt A&Ox4. Pt oriented to room. Told to call for assistance. Pt verbalized understanding. Pt's skin is warm, dry and intact. Pt placed on PC monitor in room. Will continue to monitor pt. Ranelle Oyster, RN

## 2019-10-17 LAB — GLUCOSE, CAPILLARY
Glucose-Capillary: 102 mg/dL — ABNORMAL HIGH (ref 70–99)
Glucose-Capillary: 109 mg/dL — ABNORMAL HIGH (ref 70–99)
Glucose-Capillary: 173 mg/dL — ABNORMAL HIGH (ref 70–99)
Glucose-Capillary: 248 mg/dL — ABNORMAL HIGH (ref 70–99)
Glucose-Capillary: 75 mg/dL (ref 70–99)
Glucose-Capillary: 92 mg/dL (ref 70–99)

## 2019-10-17 LAB — BASIC METABOLIC PANEL
Anion gap: 7 (ref 5–15)
BUN: 8 mg/dL (ref 6–20)
CO2: 29 mmol/L (ref 22–32)
Calcium: 8.4 mg/dL — ABNORMAL LOW (ref 8.9–10.3)
Chloride: 104 mmol/L (ref 98–111)
Creatinine, Ser: 1.06 mg/dL (ref 0.61–1.24)
GFR calc Af Amer: >60 mL/min (ref >60)
GFR calc non Af Amer: >60 mL/min (ref >60)
Glucose, Bld: 66 mg/dL — ABNORMAL LOW (ref 70–99)
Potassium: 3.5 mmol/L (ref 3.5–5.1)
Sodium: 140 mmol/L (ref 135–145)

## 2019-10-17 MED ORDER — DEXTROSE 10 % IV SOLN: INTRAVENOUS | Status: DC

## 2019-10-17 NOTE — Care Management (Addendum)
CM acknowledges consults for daily insulin administration.  Pt alert and oriented.   CM discussed concerns regarding insulin administration.  Pt denied all concerns with administration of insulin  and states that his god daughter can assist if needed.   Pt informed CM that he is active with the paramedicine program - he gets  once a week visits to assist with arranging his meds in his pill bottles.   CM also spoke with HF program liaison Jeannine Kitten to reach out to pts paramedic Katie to see if additional visits are appropriate or if other recommendations are in order.  CM offered [facility] for medication and disease management however pt declining at this time. Also if pt would agree to [facility]; [facility] can only provide once or twice weekly visit).    Update:  Jenna with HF team followed up with pt's paramedic Katie;  HF team denied concerns with pt's ability to administer or understand medication regimen.  HF team contributed  insulin dose discrepancies to recent changes to DM medications secondary to insurance coverage, not secondary to cognitive impairment.   Attending aware

## 2019-10-17 NOTE — Progress Notes (Signed)
Inpatient Diabetes Program Recommendations  AACE/ADA: New Consensus Statement on Inpatient Glycemic Control (2015)  Target Ranges:  Prepandial:   less than 140 mg/dL      Peak postprandial:   less than 180 mg/dL (1-2 hours)      Critically ill patients:  140 - 180 mg/dL   Lab Results  Component Value Date   GLUCAP 92 10/17/2019   HGBA1C 9.2 (A) 10/09/2019    Review of Glycemic Control Results for ALANTE, TOLAN (MRN [id number]) as of 10/17/2019 09:08  Ref. Range 10/16/2019 21:00 10/17/2019 00:01 10/17/2019 04:43 10/17/2019 08:08  Glucose-Capillary Latest Ref Range: 70 - 99 mg/dL 106 (H) 109 (H) 75 92   Diabetes history: Type 2 DM Outpatient Diabetes medications: Tresiba 280 units QD Current orders for Inpatient glycemic control: none, CBG Q4H D10% @ 75 ml/hr  Inpatient Diabetes Program Recommendations:    Glucose trends remain in low 100's mg/dL. Mild low this AM of 66 mg/dL per serum. D10% to continue. Continue with current management.  Once CBGs exceed 180 mg/dL would discontinue IV Dextrose and consider adding Levemir 12 units QD (73 kg x 0.17) and Novolog 0-6 units TID & HS.  Will follow.   Thanks, Bronson Curb, MSN, RNC-OB Diabetes Coordinator [phone] (8a-5p)

## 2019-10-17 NOTE — Progress Notes (Signed)
PROGRESS NOTE  Calvin [name]  PTW:[id number] DOB: 1961-01-21 DOA: 10/15/2019 PCP: [name], NP  Outpatient Specialists: Endocrinology, Dr. Loanne Drilling Brief Narrative: Calvin [name] is a 59 y.o. 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 These hypoglycemic episodes continued despite decreasing insulin from 280u daily to 180u daily, last taken on the morning of arrival. Initial labs revealed glucose <10 for which dextrose pushes were given. Despite holding insulin, giving D10 continuously, and eating 100% meals, blood sugar remains in low 100's.  Assessment & Plan: Principal Problem:   Hypoglycemia due to insulin Active Problems:   HLD (hyperlipidemia)   Chronic systolic heart failure (HCC)   Essential hypertension   OSA on CPAP   MCI (mild cognitive impairment)   AF (paroxysmal atrial fibrillation) (HCC)   Chronic anticoagulation  T2DM with severe hypogylcemia: HbA1c 9.2%. - Continue checking CBGs with dextrose infusion until CBGs come up. Eating well, will give D50 prn as well. Hypoglycemia again this AM, persistently very surprisingly normal CBGs requiring D10 infusion. Will check AM cortisol to r/o adrenal insufficiency.  - Diabetes coordinator appreciated. Will start weight-based levemir dose and SSI AC/HS once CBGs rising.  - With intermittent symptoms of polyneuropathy, continue amitriptyline. No longer on gabapentin for unclear reasons.  PAF:  - Continue digoxin and eliquis  Chronic combined HFrEF: DC weight 70.2kg in Dec 2020, weights between 70-74kg, currently 74kg, may be approaching need for lasix.  - Strict I/O, daily weights.  - Given need for continuous dextrose, monitoring  volume status closely. LUE swelling associated with PIV site, though no dependent swelling or crackles.  Constipation: LBM 4/12 - Miralax, senna added. Still no BM. Has needed enemas in the past, will order this today.  HTN:  - Continue coreg, lisinopril, spironolactone.   Mild cognitive impairment:  - Will benefit from continued home health RN, pt's god-daughter to assist with medications as well.   Dyslipidemia:  - Continue zetia, restart repatha as outpatient.  Left arm swelling: Distal to peripheral IV without tenderness, erythema.  - Elevate extremity, consider venous U/S though this is unlikely to reveal anything significant and pt to be on anticoagulation regardless.  - Can replace IV in RUE  Fatigue: Since recovering from hospitalization from covid-19 pneumonia Dec 2020. Also possibly related to glucose shifts/hypoglycemia.   Shortness of breath: No respiratory distress or hypoxemia.  - Has been scheduled to follow up with pulmonology post-covid.   DVT prophylaxis: Eliquis Code Status: Full Family Communication: None at bedside. Called sister without answer today. LVM to call back. Disposition Plan:  Status is: Inpatient  Remains inpatient appropriate because:Persistent severe electrolyte disturbances and IV treatments appropriate due to intensity of illness or inability to take PO   Dispo: The patient is from: Home              Anticipated d/c is to: Home              Anticipated d/c date is: 1 day              Patient currently is not medically stable to d/c.  Consultants:   Diabetes coordinator  Procedures:   None  Antimicrobials:  None   Subjective: Feels fine now, felt terrible when sugar was low. Having no chest pain, shortness  of breath, leg swelling, or N/V/D.   Objective: Vitals:   10/17/19 0800 10/17/19 0810 10/17/19 0950 10/17/19 1152  BP: 123/64 123/64 127/76 130/70  Pulse: 90 93 83 87  Resp: 16 20 20  (!) 22  Temp:      TempSrc:       SpO2: 96% 97% 98% 99%  Weight:      Height:        Intake/Output Summary (Last 24 hours) at 10/17/2019 1215 Last data filed at 10/17/2019 0603 Gross per 24 hour  Intake 1399.08 ml  Output 1900 ml  Net -500.92 ml   Filed Weights   10/15/19 2313 10/16/19 0348 10/17/19 0441  Weight: 72.6 kg 73 kg 74.8 kg    Gen: 59 y.o. male in no distress  Pulm: Non-labored breathing room air. Clear to auscultation bilaterally.  CV: Regular rate and rhythm. No murmur, rub, or gallop. No JVD, no pedal edema. GI: Abdomen soft, non-tender, non-distended, with normoactive bowel sounds. No organomegaly or masses felt. Ext: Warm, no deformities Skin: No rashes, lesions or ulcers Neuro: Alert and oriented. No focal neurological deficits. Psych: Judgement and insight appear normal. Mood & affect appropriate.   Data Reviewed: I have personally reviewed following labs and imaging studies  CBC: Recent Labs  Lab 10/13/19 2202 10/13/19 2202 10/13/19 2211 10/15/19 1345 10/15/19 1359 10/15/19 1400 10/16/19 0403  WBC 4.7  --   --  8.1  --   --  7.8  NEUTROABS 3.6  --   --   --   --   --   --   HGB 14.7   < > 14.6 15.0 15.0 15.3 14.0  HCT 43.6   < > 43.0 45.5 44.0 45.0 41.3  MCV 93.0  --   --  91.5  --   --  90.8  PLT 203  --   --  290  --   --  252   < > = values in this interval not displayed.   Basic Metabolic Panel: Recent Labs  Lab 10/13/19 2202 10/13/19 2202 10/13/19 2211 10/13/19 2211 10/15/19 1345 10/15/19 1359 10/15/19 1400 10/16/19 0403 10/17/19 0419  NA 140   < > 139   < > 144 143 144 138 140  K 3.7   < > 3.6   < > 3.5 3.4* 3.5 3.9 3.5  CL 105   < > 101  --  108 105  --  103 104  CO2 27  --   --   --  28  --   --  26 29  GLUCOSE 108*   < > 101*  --  <20* <20*  --  146* 66*  BUN <5*   < > 3*  --  <5* 4*  --  8 8  CREATININE 0.93   < > 1.00  --  1.17 1.20  --  1.05 1.06  CALCIUM 8.7*  --   --   --  9.1  --   --  8.7* 8.4*  MG  --   --   --   --   --   --   --  1.9  --    < > =  values in this interval not displayed.   GFR: Estimated Creatinine Clearance: 69.4 mL/min (by C-G formula based on SCr of 1.06 mg/dL). Liver Function Tests: Recent Labs  Lab 10/13/19 2202  AST 28  ALT 28  ALKPHOS 58  BILITOT 0.4  PROT 6.2*  ALBUMIN 3.5  No results for input(s): LIPASE, AMYLASE in the last 168 hours. No results for input(s): AMMONIA in the last 168 hours. Coagulation Profile: No results for input(s): INR, PROTIME in the last 168 hours. Cardiac Enzymes: No results for input(s): CKTOTAL, CKMB, CKMBINDEX, TROPONINI in the last 168 hours. BNP (last 3 results) No results for input(s): PROBNP in the last 8760 hours. HbA1C: No results for input(s): HGBA1C in the last 72 hours. CBG: Recent Labs  Lab 10/16/19 2100 10/17/19 0001 10/17/19 0443 10/17/19 0808 10/17/19 1151  GLUCAP 106* 109* 75 92 102*   Lipid Profile: No results for input(s): CHOL, HDL, LDLCALC, TRIG, CHOLHDL, LDLDIRECT in the last 72 hours. Thyroid Function Tests: No results for input(s): TSH, T4TOTAL, FREET4, T3FREE, THYROIDAB in the last 72 hours. Anemia Panel: No results for input(s): VITAMINB12, FOLATE, FERRITIN, TIBC, IRON, RETICCTPCT in the last 72 hours. Urine analysis:    Component Value Date/Time   COLORURINE YELLOW 06/17/2019 1600   APPEARANCEUR HAZY (A) 06/17/2019 1600   LABSPEC 1.023 06/17/2019 1600   PHURINE 5.0 06/17/2019 1600   GLUCOSEU >=500 (A) 06/17/2019 1600   HGBUR MODERATE (A) 06/17/2019 1600   BILIRUBINUR NEGATIVE 06/17/2019 1600   KETONESUR NEGATIVE 06/17/2019 1600   PROTEINUR 100 (A) 06/17/2019 1600   UROBILINOGEN 0.2 11/28/2012 1857   NITRITE NEGATIVE 06/17/2019 1600   LEUKOCYTESUR NEGATIVE 06/17/2019 1600   Recent Results (from the past 240 hour(s))  Respiratory Panel by RT PCR (Flu A&B, Covid) - Nasopharyngeal Swab     Status: None   Collection Time: 10/15/19 10:42 PM   Specimen: Nasopharyngeal Swab  Result Value Ref Range Status   SARS Coronavirus 2 by RT  PCR NEGATIVE NEGATIVE Final    Comment: (NOTE) SARS-CoV-2 target nucleic acids are NOT DETECTED. The SARS-CoV-2 RNA is generally detectable in upper respiratoy specimens during the acute phase of infection. The lowest concentration of SARS-CoV-2 viral copies this assay can detect is 131 copies/mL. A negative result does not preclude SARS-Cov-2 infection and should not be used as the sole basis for treatment or other patient management decisions. A negative result may occur with  improper specimen collection/handling, submission of specimen other than nasopharyngeal swab, presence of viral mutation(s) within the areas targeted by this assay, and inadequate number of viral copies (<131 copies/mL). A negative result must be combined with clinical observations, patient history, and epidemiological information. The expected result is Negative. Fact Sheet for Patients:  PinkCheek.be Fact Sheet for Healthcare Providers:  GravelBags.it This test is not yet ap proved or cleared by the [place] FDA and  has been authorized for detection and/or diagnosis of SARS-CoV-2 by FDA under an Emergency Use Authorization (EUA). This EUA will remain  in effect (meaning this test can be used) for the duration of the COVID-19 declaration under Section 564(b)(1) of the Act, 21 U.S.C. section 360bbb-3(b)(1), unless the authorization is terminated or revoked sooner.    Influenza A by PCR NEGATIVE NEGATIVE Final   Influenza B by PCR NEGATIVE NEGATIVE Final    Comment: (NOTE) The Xpert Xpress SARS-CoV-2/FLU/RSV assay is intended as an aid in  the diagnosis of influenza from Nasopharyngeal swab specimens and  should not be used as a sole basis for treatment. Nasal washings and  aspirates are unacceptable for Xpert Xpress SARS-CoV-2/FLU/RSV  testing. Fact Sheet for Patients: PinkCheek.be Fact Sheet for Healthcare  Providers: GravelBags.it This test is not yet approved or cleared by the [place] FDA and  has been authorized for detection and/or diagnosis of SARS-CoV-2  by  FDA under an Emergency Use Authorization (EUA). This EUA will remain  in effect (meaning this test can be used) for the duration of the  Covid-19 declaration under Section 564(b)(1) of the Act, 21  U.S.C. section 360bbb-3(b)(1), unless the authorization is  terminated or revoked. Performed at [facility], [place] [address], [place], [place] [address]   MRSA PCR Screening     Status: None   Collection Time: 10/16/19 12:17 AM   Specimen: Nasal Mucosa; Nasopharyngeal  Result Value Ref Range Status   MRSA by PCR NEGATIVE NEGATIVE Final    Comment:        The GeneXpert MRSA Assay (FDA approved for NASAL specimens only), is one component of a comprehensive MRSA colonization surveillance program. It is not intended to diagnose MRSA infection nor to guide or monitor treatment for MRSA infections. Performed at [facility], [place] [address], [place], [place] [address]       Radiology Studies: No results found.  Scheduled Meds: . amitriptyline  20 mg Oral QHS  . apixaban  5 mg Oral BID  . carvedilol  6.25 mg Oral BID WC  . digoxin  0.125 mg Oral Daily  . donepezil  10 mg Oral QHS  . ezetimibe  10 mg Oral Daily  . lisinopril  10 mg Oral QHS  . ondansetron  4 mg Intravenous Once  . polyethylene glycol  17 g Oral BID  . senna  1 tablet Oral Daily  . sodium chloride flush  3 mL Intravenous Q12H  . spironolactone  25 mg Oral Daily   Continuous Infusions: . sodium chloride    . dextrose       LOS: 1 day   Time spent: 25 minutes.  Patrecia Pour, MD [facility] [email or web address] 10/17/2019, 12:15 PM
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
## 2019-10-18 LAB — BASIC METABOLIC PANEL
Anion gap: 11 (ref 5–15)
BUN: 7 mg/dL (ref 6–20)
CO2: 25 mmol/L (ref 22–32)
Calcium: 8.5 mg/dL — ABNORMAL LOW (ref 8.9–10.3)
Chloride: 102 mmol/L (ref 98–111)
Creatinine, Ser: 1.08 mg/dL (ref 0.61–1.24)
GFR calc Af Amer: >60 mL/min (ref >60)
GFR calc non Af Amer: >60 mL/min (ref >60)
Glucose, Bld: 138 mg/dL — ABNORMAL HIGH (ref 70–99)
Potassium: 4.2 mmol/L (ref 3.5–5.1)
Sodium: 138 mmol/L (ref 135–145)

## 2019-10-18 LAB — CORTISOL-AM, BLOOD: Cortisol - AM: 8 ug/dL (ref 6.7–22.6)

## 2019-10-18 LAB — GLUCOSE, CAPILLARY
Glucose-Capillary: 109 mg/dL — ABNORMAL HIGH (ref 70–99)
Glucose-Capillary: 145 mg/dL — ABNORMAL HIGH (ref 70–99)
Glucose-Capillary: 166 mg/dL — ABNORMAL HIGH (ref 70–99)
Glucose-Capillary: 178 mg/dL — ABNORMAL HIGH (ref 70–99)
Glucose-Capillary: 180 mg/dL — ABNORMAL HIGH (ref 70–99)
Glucose-Capillary: 209 mg/dL — ABNORMAL HIGH (ref 70–99)

## 2019-10-18 MED ORDER — SENNOSIDES-DOCUSATE SODIUM 8.6-50 MG PO TABS
2.0000 | ORAL_TABLET | Freq: Two times a day (BID) | ORAL | Status: DC
  Administered 2019-10-18 – 2019-10-19 (×3): 2 via ORAL
  Filled 2019-10-18 (×3): qty 2

## 2019-10-18 NOTE — Progress Notes (Signed)
PROGRESS NOTE  Calvin [name]  XTK:[id number] DOB: 06/24/1961 DOA: 10/15/2019 PCP: [name], NP  Outpatient Specialists: Endocrinology, Dr. Loanne Drilling Brief Narrative: Calvin [name] is a 59 y.o. 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 These hypoglycemic episodes continued despite decreasing insulin from 280u daily to 180u daily, last taken on the morning of arrival. Initial labs revealed glucose <10 for which dextrose pushes were given. Despite holding insulin, giving D10 continuously, and eating 100% meals, blood sugar remained in low 100's. On 4/15, slight increase noted though back to normal range without insulin. Dextrose infusion is being stopped 4/16.   Assessment & Plan: Principal Problem:   Hypoglycemia due to insulin Active Problems:   HLD (hyperlipidemia)   Chronic systolic heart failure (HCC)   Essential hypertension   OSA on CPAP   MCI (mild cognitive impairment)   AF (paroxysmal atrial fibrillation) (HCC)   Chronic anticoagulation  T2DM with severe hypogylcemia: HbA1c 9.2%. Cortisol sufficient. - Stop dextrose and push po intake. If rises today, will start basal insulin. Urged close follow up with endocrinology to both patient and family.  - Diabetes coordinator appreciated.  - With intermittent symptoms of polyneuropathy, continue amitriptyline. No longer on gabapentin for unclear reasons.  PAF:  - Continue digoxin and eliquis  Chronic combined HFrEF: DC weight 70.2kg in Dec 2020, weights between 70-74kg, currently 74kg. - Strict I/O, daily weights.  - Stop IVF. Does not currently require diuretic, but will continue close monitoring by exam.   Constipation: LBM 4/16.  Chronic. - Miralax, senna added, will increase senna-ducosate dose and frequency to 2 tabs BID. Had successful enema 4/16, abd benign.   HTN:  - Continue coreg, lisinopril, spironolactone.   Mild cognitive impairment:  - Will benefit from continued home health RN, pt's god-daughter to assist with medications as well.  - Strongly consider MiniCog vs. formal neuropsychiatry evaluation as an outpatient.   Dyslipidemia:  - Continue zetia, restart repatha as outpatient.  Left arm swelling: Distal to peripheral IV without tenderness, erythema. Improved after IV removed.  - Elevate extremity  Fatigue: Since recovering from hospitalization from covid-19 pneumonia Dec 2020. Also possibly related to glucose shifts/hypoglycemia.   Shortness of breath: No respiratory distress or hypoxemia.  - Has been scheduled to follow up with pulmonology post-covid.   DVT prophylaxis: Eliquis Code Status: Full Family Communication: None at bedside. Spoke with sister at length 4/15. Disposition Plan:  Status is: Inpatient  Remains inpatient appropriate because:Persistent severe electrolyte disturbances and IV treatments appropriate due to intensity of illness or inability to take PO   Dispo: The patient is from: Home              Anticipated d/c is to: Home              Anticipated d/c date is: 1 day              Patient currently is not medically stable to d/c.  Consultants:   Diabetes coordinator  Procedures:   None  Antimicrobials:  None   Subjective: Feels much better after large BM from enema yesterday, no new pains, chest pain, dyspnea. Left arm swelling improved. Eating well. No new complaints.   Objective: Vitals:   10/18/19 0400 10/18/19 0727 10/18/19 0800 10/18/19 2426  BP: (!) 106/51 125/69 126/66   Pulse: 100 97 92 87  Resp: 19 18 14    Temp: 98.1 F (36.7 C) 98 F (36.7 C)    TempSrc: Oral Oral    SpO2: 98% 98% 98%   Weight: 74 kg     Height:        Intake/Output  Summary (Last 24 hours) at 10/18/2019 0930 Last data filed at 10/18/2019 0516 Gross per 24 hour  Intake 2025 ml  Output 1200 ml  Net 825 ml   Filed Weights   10/16/19 0348 10/17/19 0441 10/18/19 0400  Weight: 73 kg 74.8 kg 74 kg   Gen: 59 y.o. male in no distress Pulm: Nonlabored breathing room air. Clear. CV: Regular rate and rhythm. No murmur, rub, or gallop. No JVD, no dependent edema. GI: Abdomen soft, non-tender, non-distended, with normoactive bowel sounds.  Ext: Warm, no deformities Skin: No rashes, lesions or ulcers on visualized skin. Neuro: Alert and oriented. No focal neurological deficits. Psych: Judgement and insight appear intact. Mood euthymic & affect congruent. Behavior is appropriate.    Data Reviewed: I have personally reviewed following labs and imaging studies  CBC: Recent Labs  Lab 10/13/19 2202 10/13/19 2202 10/13/19 2211 10/15/19 1345 10/15/19 1359 10/15/19 1400 10/16/19 0403  WBC 4.7  --   --  8.1  --   --  7.8  NEUTROABS 3.6  --   --   --   --   --   --   HGB 14.7   < > 14.6 15.0 15.0 15.3 14.0  HCT 43.6   < > 43.0 45.5 44.0 45.0 41.3  MCV 93.0  --   --  91.5  --   --  90.8  PLT 203  --   --  290  --   --  252   < > = values in this interval not displayed.   Basic Metabolic Panel: Recent Labs  Lab 10/13/19 2202 10/13/19 2211 10/15/19 1345 10/15/19 1345 10/15/19 1359 10/15/19 1400 10/16/19 0403 10/17/19 0419 10/18/19 0607  NA 140   < > 144   < > 143 144 138 140 138  K 3.7   < > 3.5   < > 3.4* 3.5 3.9 3.5 4.2  CL 105   < > 108  --  105  --  103 104 102  CO2 27  --  28  --   --   --  26 29 25   GLUCOSE 108*   < > <20*  --  <20*  --  146* 66* 138*  BUN <5*   < > <5*  --  4*  --  8 8 7   CREATININE 0.93   < > 1.17  --  1.20  --  1.05 1.06 1.08  CALCIUM 8.7*  --  9.1  --   --   --  8.7* 8.4* 8.5*  MG  --   --   --   --   --   --  1.9  --   --    < > = values in this interval not displayed.   GFR: Estimated Creatinine Clearance: 67.8  mL/min (by C-G formula based on SCr of 1.08 mg/dL). Liver Function Tests: Recent Labs  Lab 10/13/19 2202  AST 28  ALT 28  ALKPHOS 58  BILITOT 0.4  PROT 6.2*  ALBUMIN 3.5   No results for input(s): LIPASE, AMYLASE in the last 168 hours. No results for input(s): AMMONIA in the last  168 hours. Coagulation Profile: No results for input(s): INR, PROTIME in the last 168 hours. Cardiac Enzymes: No results for input(s): CKTOTAL, CKMB, CKMBINDEX, TROPONINI in the last 168 hours. BNP (last 3 results) No results for input(s): PROBNP in the last 8760 hours. HbA1C: No results for input(s): HGBA1C in the last 72 hours. CBG: Recent Labs  Lab 10/17/19 1626 10/17/19 2056 10/18/19 0013 10/18/19 0421 10/18/19 0726  GLUCAP 173* 248* 166* 180* 109*   Lipid Profile: No results for input(s): CHOL, HDL, LDLCALC, TRIG, CHOLHDL, LDLDIRECT in the last 72 hours. Thyroid Function Tests: No results for input(s): TSH, T4TOTAL, FREET4, T3FREE, THYROIDAB in the last 72 hours. Anemia Panel: No results for input(s): VITAMINB12, FOLATE, FERRITIN, TIBC, IRON, RETICCTPCT in the last 72 hours. Urine analysis:    Component Value Date/Time   COLORURINE YELLOW 06/17/2019 1600   APPEARANCEUR HAZY (A) 06/17/2019 1600   LABSPEC 1.023 06/17/2019 1600   PHURINE 5.0 06/17/2019 1600   GLUCOSEU >=500 (A) 06/17/2019 1600   HGBUR MODERATE (A) 06/17/2019 1600   BILIRUBINUR NEGATIVE 06/17/2019 1600   KETONESUR NEGATIVE 06/17/2019 1600   PROTEINUR 100 (A) 06/17/2019 1600   UROBILINOGEN 0.2 11/28/2012 1857   NITRITE NEGATIVE 06/17/2019 1600   LEUKOCYTESUR NEGATIVE 06/17/2019 1600   Recent Results (from the past 240 hour(s))  Respiratory Panel by RT PCR (Flu A&B, Covid) - Nasopharyngeal Swab     Status: None   Collection Time: 10/15/19 10:42 PM   Specimen: Nasopharyngeal Swab  Result Value Ref Range Status   SARS Coronavirus 2 by RT PCR NEGATIVE NEGATIVE Final    Comment: (NOTE) SARS-CoV-2 target nucleic acids  are NOT DETECTED. The SARS-CoV-2 RNA is generally detectable in upper respiratoy specimens during the acute phase of infection. The lowest concentration of SARS-CoV-2 viral copies this assay can detect is 131 copies/mL. A negative result does not preclude SARS-Cov-2 infection and should not be used as the sole basis for treatment or other patient management decisions. A negative result may occur with  improper specimen collection/handling, submission of specimen other than nasopharyngeal swab, presence of viral mutation(s) within the areas targeted by this assay, and inadequate number of viral copies (<131 copies/mL). A negative result must be combined with clinical observations, patient history, and epidemiological information. The expected result is Negative. Fact Sheet for Patients:  PinkCheek.be Fact Sheet for Healthcare Providers:  GravelBags.it This test is not yet ap proved or cleared by the [place] FDA and  has been authorized for detection and/or diagnosis of SARS-CoV-2 by FDA under an Emergency Use Authorization (EUA). This EUA will remain  in effect (meaning this test can be used) for the duration of the COVID-19 declaration under Section 564(b)(1) of the Act, 21 U.S.C. section 360bbb-3(b)(1), unless the authorization is terminated or revoked sooner.    Influenza A by PCR NEGATIVE NEGATIVE Final   Influenza B by PCR NEGATIVE NEGATIVE Final    Comment: (NOTE) The Xpert Xpress SARS-CoV-2/FLU/RSV assay is intended as an aid in  the diagnosis of influenza from Nasopharyngeal swab specimens and  should not be used as a sole basis for treatment. Nasal washings and  aspirates are unacceptable for Xpert Xpress SARS-CoV-2/FLU/RSV  testing. Fact Sheet for Patients: PinkCheek.be Fact Sheet for Healthcare Providers: GravelBags.it This test is not yet approved or  cleared by the [place] FDA and  has been authorized for detection and/or diagnosis of SARS-CoV-2 by  FDA under an Emergency Use Authorization (EUA). This EUA will remain  in effect (meaning this test  can be used) for the duration of the  Covid-19 declaration under Section 564(b)(1) of the Act, 21  U.S.C. section 360bbb-3(b)(1), unless the authorization is  terminated or revoked. Performed at [facility], [place] [address] [place], [place] [address]   MRSA PCR Screening     Status: None   Collection Time: 10/16/19 12:17 AM   Specimen: Nasal Mucosa; Nasopharyngeal  Result Value Ref Range Status   MRSA by PCR NEGATIVE NEGATIVE Final    Comment:        The GeneXpert MRSA Assay (FDA approved for NASAL specimens only), is one component of a comprehensive MRSA colonization surveillance program. It is not intended to diagnose MRSA infection nor to guide or monitor treatment for MRSA infections. Performed at [facility], [place] [address], [place], [place] [address]       Radiology Studies: No results found.  Scheduled Meds: . amitriptyline  20 mg Oral QHS  . apixaban  5 mg Oral BID  . carvedilol  6.25 mg Oral BID WC  . digoxin  0.125 mg Oral Daily  . donepezil  10 mg Oral QHS  . ezetimibe  10 mg Oral Daily  . lisinopril  10 mg Oral QHS  . ondansetron  4 mg Intravenous Once  . polyethylene glycol  17 g Oral BID  . senna  1 tablet Oral Daily  . sodium chloride flush  3 mL Intravenous Q12H  . spironolactone  25 mg Oral Daily   Continuous Infusions: . sodium chloride       LOS: 2 days   Time spent: 25 minutes.  Patrecia Pour, MD [facility] [email or web address] 10/18/2019, 9:30 AM

## 2019-10-18 NOTE — Consult Note (Signed)
   [facility] Inpatient Consult   10/18/2019  Calvin [name] [birth year] [id number]   Patient is currently active with [facility] for chronic disease management services.  Patient has been engaged by a [facility] Management Coordinator and with [facility] Pharmacist.  Our community based plan of care has focused on disease management and community resource support.   Patient is active with [facility] paramedicine program. Patient will receive a post hospital call and will be evaluated for assessments and disease process education.    Plan: Follow up  From and spoke with Inpatient Transition Of Care [TOC] team member on 10/17/19 regarding [facility] following. Will alert [facility] Care Management and [facility] Pharmacist of transition of care issues/needs.  Of note, [facility] Care Management services does not replace or interfere with any services that are needed or arranged by inpatient [facility] care management team.  For additional questions or referrals please contact:  Natividad Brood, RN BSN [facility] Liaison  [phone] business mobile phone Toll free office [phone]  Fax number: [phone] [name]

## 2019-10-19 LAB — GLUCOSE, CAPILLARY
Glucose-Capillary: 133 mg/dL — ABNORMAL HIGH (ref 70–99)
Glucose-Capillary: 147 mg/dL — ABNORMAL HIGH (ref 70–99)
Glucose-Capillary: 150 mg/dL — ABNORMAL HIGH (ref 70–99)
Glucose-Capillary: 160 mg/dL — ABNORMAL HIGH (ref 70–99)
Glucose-Capillary: 169 mg/dL — ABNORMAL HIGH (ref 70–99)
Glucose-Capillary: 171 mg/dL — ABNORMAL HIGH (ref 70–99)

## 2019-10-19 MED ORDER — TRESIBA FLEXTOUCH 200 UNIT/ML ~~LOC~~ SOPN: PEN_INJECTOR | SUBCUTANEOUS | Status: DC

## 2019-10-19 NOTE — Discharge Summary (Signed)
Physician Discharge Summary  Calvin [name] HUT:[id number] DOB: 03/26/1961 DOA: 10/15/2019  PCP: [name], NP  Admit date: 10/15/2019 Discharge date: 10/19/2019  Admitted From: Home Disposition: Home   Recommendations for Outpatient Follow-up:  1. Follow up with endocrinology in less than 1 week. Admitted for severe hypoglycemia despite decreasing insulin as an outpatient. Since blood sugars have not risen significantly during hospitalization despite not administering ANY insulin, the cause of hypoglycemia is suspected to be due to new-onset adherence to insulin regimen.  2. Consider formal neuropsychiatry evaluation. Though there is considerable documentation to the effect of the patient's cognitive impairment, he has remained completely oriented throughout hospitalization.   Home Health: RN, [facility] referral Equipment/Devices: Has glucometer Discharge Condition: Stable CODE STATUS: Full Diet recommendation: Carb-modified  Brief/Interim Summary: Calvin [name] is a 59 y.o. 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 These hypoglycemic episodes continued despite decreasing insulin from 280u daily to 180u daily, last taken on the morning of arrival. Initial labs revealed glucose <10 for which dextrose pushes were given. Despite holding insulin, giving D10 continuously, and eating 100% meals, blood sugar remained in low 100's. On 4/15, slight increase noted though back to normal range without insulin. Dextrose infusion was stopped 4/16 and blood sugars remained within tolerable limits without further hypoglycemia. The patient was discharged on 4/17 in stable condition with verbal and written instructions  to avoid insulin and continue eating regularly. He will follow up with his endocrinologist in 48-72 hours and call sooner if blood sugar drops low or remains elevated.   Discharge Diagnoses:  Principal Problem:   Hypoglycemia due to insulin Active Problems:   HLD (hyperlipidemia)   Chronic systolic heart failure (HCC)   Essential hypertension   OSA on CPAP   MCI (mild cognitive impairment)   AF (paroxysmal atrial fibrillation) (HCC)   Chronic anticoagulation  T2DM with severe hypogylcemia: HbA1c 9.2%. Cortisol sufficient. - Blood sugars stable since discontinuing dextrose infusion. Will discharge with no scheduled insulin, but can take low dose if CBGs consistently >272m/dl. Urged close follow up with endocrinology to both patient and family.  - Diabetes coordinator appreciated.  - With intermittent symptoms of polyneuropathy, continue amitriptyline. No longer on gabapentin for unclear reasons.  PAF:  - Continue digoxin and eliquis  Chronic combined HFrEF: DC weight 70.2kg in Dec 2020, weights between 70-74kg, currently 74kg.  Constipation:  - Miralax, senna added, will increase senna-ducosate dose and frequency to 2 tabs BID. Had successful enema 4/16, abd benign.   HTN:  - Continue coreg, lisinopril, spironolactone.   Mild cognitive impairment:  - Will benefit from continued home health RN, pt's god-daughter to assist with medications as well.  - Strongly consider MiniCog vs. formal neuropsychiatry evaluation as an outpatient.   Dyslipidemia:  - Continue zetia, restart repatha as outpatient.  Left arm swelling: Distal to peripheral IV without tenderness, erythema. Improved after IV removed.   Fatigue: Since recovering from hospitalization from covid-19 pneumonia Dec 2020. Also possibly related to glucose shifts/hypoglycemia.   Shortness of breath: No respiratory distress or hypoxemia.  - Has been scheduled to follow up with pulmonology post-covid.   Discharge  Instructions Discharge Instructions    Discharge instructions   Complete by: As directed    You were admitted for extremely low blood sugar  which has improved. You no longer require continuous dextrose (sugar) infusion to keep up your blood sugar and are eating well. Oddly, your blood sugar has not risen to the point that you require any insulin at this time. You are stable for discharge with the following recommendations:  - Continue monitoring blood sugar closely, 4 times per day. If your blood sugar goes above 284m/dl for 2 checks in a row, inject only 12 units of insulin once daily. If blood sugar continues to rise, call your doctor for recommendations.  - You NEED to follow up with either your primary doctor or Dr. ELoanne Drillingin the next few days. Schedule this hospital follow up appointment as soon as possible. - If your blood sugar drops low again, eat carbohydrates and recheck. If still low or symptomatic, seek medical attention.     Allergies as of 10/19/2019   No Known Allergies     Medication List    TAKE these medications   albuterol 108 (90 Base) MCG/ACT inhaler Commonly known as: VENTOLIN HFA Inhale 2 puffs into the lungs every 6 (six) hours as needed for wheezing or shortness of breath.   amitriptyline 10 MG tablet Commonly known as: ELAVIL Take 2 tablets at bed time What changed:   how much to take  how to take this  when to take this  additional instructions   apixaban 5 MG Tabs tablet Commonly known as: ELIQUIS Take 5 mg by mouth 2 (two) times daily.   carvedilol 6.25 MG tablet Commonly known as: COREG Take 1 tablet (6.25 mg total) by mouth 2 (two) times daily with a meal.   digoxin 0.125 MG tablet Commonly known as: LANOXIN Take 1 tablet (0.125 mg total) by mouth daily.   donepezil 10 MG tablet Commonly known as: ARICEPT Take 1 tablet (10 mg total) by mouth at bedtime.   ezetimibe 10 MG tablet Commonly known as: ZETIA Take 1 tablet (10 mg total) by  mouth daily.   INSULIN SYRINGE .3CC/29GX1/2" 29G X 1/2" 0.3 ML Misc Inject 1 Syringe 3 (three) times daily as directed. Check blood sugar three times daily. Dx: E11.9 What changed: when to take this   lisinopril 10 MG tablet Commonly known as: ZESTRIL Take 1 tablet (10 mg total) by mouth at bedtime.   nitroGLYCERIN 0.4 MG SL tablet Commonly known as: NITROSTAT Place 1 tablet (0.4 mg total) under the tongue every 5 (five) minutes as needed for chest pain.   OneTouch Delica Plus [id number] 1 each by Does not apply route 2 (two) times daily. E11.9   OneTouch Ultra test strip Generic drug: glucose blood 1 each by Other route 2 (two) times daily. E11.9   polyethylene glycol 17 g packet Commonly known as: MIRALAX / GLYCOLAX Take 17 g by mouth 2 (two) times daily.   Repatha SureClick 1149MG/ML Soaj Generic drug: Evolocumab Inject 1 pen into the skin every 14 (fourteen) days.   senna 8.6 MG Tabs tablet Commonly known as: SENOKOT Take 1 tablet (8.6 mg total) by mouth daily as needed for mild constipation.   spironolactone 25 MG tablet Commonly known as: ALDACTONE Take 1 tablet (25 mg total) by mouth daily.   TTyler AasFlexTouch 200 UNIT/ML FlexTouch Pen Generic drug: insulin degludec If blood sugar rises consistently above 200 mg/dl, inject 12 units once daily What changed:   how much to take  how to take this  when to take this  additional instructions      Follow-up Information  [name], NP. Schedule an appointment as soon as possible for a visit in 1 week(s).   Specialty: Geriatric Medicine Contact information: [address] [place] [address] [phone]        [name], MD. Go on 10/21/2019.   Specialty: Endocrinology Contact information: [address] [place] [address] [phone]          No Known Allergies  Consultations:  None  Procedures/Studies: CARDIAC CATHETERIZATION  Result Date:  10/01/2019  Mid LAD lesion is 40% stenosed.  Prox Cx to Dist Cx lesion is 40% stenosed.  Prox RCA to Mid RCA lesion is 30% stenosed.  Mid RCA lesion is 50% stenosed.  Dist RCA lesion is 30% stenosed.  Dist LAD lesion is 60% stenosed.  Findings: Ao = 123/67 (91) LV = 123/10 RA =  3 RV = 22/4 PA = 21/8 (14) PCW = 8 Fick cardiac output/index = 7.4/4.3 PVR = 0.8 WU Ao sat = 100% PA sat = 79%, 80% High SVC sat = 85% Assessment: 1. Moderate non-obstructive CAD 2. NICM EF 35-40% 3. Normal filling pressures 4. High cardiac output without evidence of intracardiac shunting Plan/Discussion: Medical therapy. Consider w/u for peripheral shunting process. Glori Bickers, MD 8:46 AM   ECHOCARDIOGRAM COMPLETE  Result Date: 09/27/2019    ECHOCARDIOGRAM REPORT   Patient Name:   OBE AHLERS Date of Exam: 09/27/2019 Medical Rec #:  [id number]          Height:       64.0 in Accession #:    [id number]         Weight:       158.0 lb Date of Birth:  Feb 20, 1961         BSA:          1.770 m Patient Age:    65 years           BP:           96/62 mmHg Patient Gender: M                  HR:           88 bpm. Exam Location:  Outpatient Procedure: 2D Echo Indications:    congestive heart faillure 428.0  History:        Patient has prior history of Echocardiogram examinations, most                 recent 01/25/2019.  Sonographer:    Johny Chess RDCS Referring Phys: [place]  1. Moderate to severe global reduction in LV systolic function; mild LVH; grade 1 diastolic dysfunction.  2. Left ventricular ejection fraction, by estimation, is 30 to 35%. The left ventricle has moderate to severely decreased function. The left ventricle demonstrates global hypokinesis. There is mild left ventricular hypertrophy. Left ventricular diastolic parameters are consistent with Grade I diastolic dysfunction (impaired relaxation).  3. Right ventricular systolic function is normal. The right ventricular size is  normal.  4. The mitral valve is normal in structure. Trivial mitral valve regurgitation. No evidence of mitral stenosis.  5. The aortic valve is tricuspid. Aortic valve regurgitation is not visualized. Mild aortic valve sclerosis is present, with no evidence of aortic valve stenosis.  6. The inferior vena cava is normal in size with greater than 50% respiratory variability, suggesting right atrial pressure of 3 mmHg. FINDINGS  Left Ventricle: Left ventricular ejection fraction, by estimation, is 30 to 35%. The  left ventricle has moderate to severely decreased function. The left ventricle demonstrates global hypokinesis. The left ventricular internal cavity size was normal in size. There is mild left ventricular hypertrophy. Left ventricular diastolic parameters are consistent with Grade I diastolic dysfunction (impaired relaxation). Right Ventricle: The right ventricular size is normal.Right ventricular systolic function is normal. Left Atrium: Left atrial size was normal in size. Right Atrium: Right atrial size was normal in size. Pericardium: There is no evidence of pericardial effusion. Mitral Valve: The mitral valve is normal in structure. Normal mobility of the mitral valve leaflets. Mild mitral annular calcification. Trivial mitral valve regurgitation. No evidence of mitral valve stenosis. Tricuspid Valve: The tricuspid valve is normal in structure. Tricuspid valve regurgitation is trivial. No evidence of tricuspid stenosis. Aortic Valve: The aortic valve is tricuspid. Aortic valve regurgitation is not visualized. Mild aortic valve sclerosis is present, with no evidence of aortic valve stenosis. Pulmonic Valve: The pulmonic valve was normal in structure. Pulmonic valve regurgitation is trivial. No evidence of pulmonic stenosis. Aorta: The aortic root is normal in size and structure. Venous: The inferior vena cava is normal in size with greater than 50% respiratory variability, suggesting right atrial pressure  of 3 mmHg. IAS/Shunts: No atrial level shunt detected by color flow Doppler. Additional Comments: Moderate to severe global reduction in LV systolic function; mild LVH; grade 1 diastolic dysfunction.  LEFT VENTRICLE PLAX 2D LVIDd:         4.70 cm     Diastology LVIDs:         3.90 cm     LV e' lateral:   7.51 cm/s LV PW:         1.20 cm     LV E/e' lateral: 7.9 LV IVS:        1.20 cm     LV e' medial:    4.79 cm/s LVOT diam:     2.00 cm     LV E/e' medial:  12.4 LV SV:         46 LV SV Index:   26 LVOT Area:     3.14 cm  LV Volumes (MOD) LV vol d, MOD A2C: 71.3 ml LV vol d, MOD A4C: 68.9 ml LV vol s, MOD A2C: 47.2 ml LV vol s, MOD A4C: 47.0 ml LV SV MOD A2C:     24.1 ml LV SV MOD A4C:     68.9 ml LV SV MOD BP:      22.7 ml RIGHT VENTRICLE RV S prime:     9.25 cm/s TAPSE (M-mode): 1.3 cm LEFT ATRIUM             Index       RIGHT ATRIUM           Index LA diam:        3.80 cm 2.15 cm/m  RA Area:     11.70 cm LA Vol (A2C):   37.6 ml 21.24 ml/m RA Volume:   24.20 ml  13.67 ml/m LA Vol (A4C):   32.6 ml 18.42 ml/m LA Biplane Vol: 36.0 ml 20.34 ml/m  AORTIC VALVE LVOT Vmax:   90.70 cm/s LVOT Vmean:  58.100 cm/s LVOT VTI:    0.145 m  AORTA Ao Root diam: 3.40 cm Ao Asc diam:  3.30 cm MV E velocity: 59.60 cm/s MV A velocity: 103.00 cm/s  SHUNTS MV E/A ratio:  0.58         Systemic VTI:  0.14 m  Systemic Diam: 2.00 cm Kirk Ruths MD Electronically signed by Kirk Ruths MD Signature Date/Time: 09/27/2019/3:55:00 PM    Final       Subjective: Feels fine. No low blood sugars, had BM with enema and abdomen feels better, eating ok.   Discharge Exam: Vitals:   10/18/19 2015 10/19/19 0414  BP: (!) 123/56 (!) 122/56  Pulse: 88 89  Resp: 18 18  Temp: 98.5 F (36.9 C) 98.1 F (36.7 C)  SpO2: 100% 100%   General: Pt is alert, awake, not in acute distress Cardiovascular: RRR, S1/S2 +, no rubs, no gallops Respiratory: CTA bilaterally, no wheezing, no rhonchi Abdominal: Soft, NT,  ND, bowel sounds + Extremities: No edema, no cyanosis  Labs: BNP (last 3 results) Recent Labs    06/27/19 0205 06/28/19 1240 06/29/19 0250  BNP 38.8 103.5* 85.8   Basic Metabolic Panel: Recent Labs  Lab 10/13/19 2202 10/13/19 2211 10/15/19 1345 10/15/19 1345 10/15/19 1359 10/15/19 1400 10/16/19 0403 10/17/19 0419 10/18/19 0607  NA 140   < > 144   < > 143 144 138 140 138  K 3.7   < > 3.5   < > 3.4* 3.5 3.9 3.5 4.2  CL 105   < > 108  --  105  --  103 104 102  CO2 27  --  28  --   --   --  26 29 25   GLUCOSE 108*   < > <20*  --  <20*  --  146* 66* 138*  BUN <5*   < > <5*  --  4*  --  8 8 7   CREATININE 0.93   < > 1.17  --  1.20  --  1.05 1.06 1.08  CALCIUM 8.7*  --  9.1  --   --   --  8.7* 8.4* 8.5*  MG  --   --   --   --   --   --  1.9  --   --    < > = values in this interval not displayed.   Liver Function Tests: Recent Labs  Lab 10/13/19 2202  AST 28  ALT 28  ALKPHOS 58  BILITOT 0.4  PROT 6.2*  ALBUMIN 3.5   No results for input(s): LIPASE, AMYLASE in the last 168 hours. No results for input(s): AMMONIA in the last 168 hours. CBC: Recent Labs  Lab 10/13/19 2202 10/13/19 2202 10/13/19 2211 10/15/19 1345 10/15/19 1359 10/15/19 1400 10/16/19 0403  WBC 4.7  --   --  8.1  --   --  7.8  NEUTROABS 3.6  --   --   --   --   --   --   HGB 14.7   < > 14.6 15.0 15.0 15.3 14.0  HCT 43.6   < > 43.0 45.5 44.0 45.0 41.3  MCV 93.0  --   --  91.5  --   --  90.8  PLT 203  --   --  290  --   --  252   < > = values in this interval not displayed.   Cardiac Enzymes: No results for input(s): CKTOTAL, CKMB, CKMBINDEX, TROPONINI in the last 168 hours. BNP: Invalid input(s): POCBNP CBG: Recent Labs  Lab 10/18/19 2013 10/19/19 0007 10/19/19 0348 10/19/19 0412 10/19/19 0811  GLUCAP 209* 150* 133* 147* 160*   D-Dimer No results for input(s): DDIMER in the last 72 hours. Hgb A1c No results for input(s): HGBA1C in the last 72 hours. Lipid  Profile No results for  input(s): CHOL, HDL, LDLCALC, TRIG, CHOLHDL, LDLDIRECT in the last 72 hours. Thyroid function studies No results for input(s): TSH, T4TOTAL, T3FREE, THYROIDAB in the last 72 hours.  Invalid input(s): FREET3 Anemia work up No results for input(s): VITAMINB12, FOLATE, FERRITIN, TIBC, IRON, RETICCTPCT in the last 72 hours. Urinalysis    Component Value Date/Time   COLORURINE YELLOW 06/17/2019 1600   APPEARANCEUR HAZY (A) 06/17/2019 1600   LABSPEC 1.023 06/17/2019 1600   PHURINE 5.0 06/17/2019 1600   GLUCOSEU >=500 (A) 06/17/2019 1600   HGBUR MODERATE (A) 06/17/2019 1600   BILIRUBINUR NEGATIVE 06/17/2019 1600   KETONESUR NEGATIVE 06/17/2019 1600   PROTEINUR 100 (A) 06/17/2019 1600   UROBILINOGEN 0.2 11/28/2012 1857   NITRITE NEGATIVE 06/17/2019 1600   LEUKOCYTESUR NEGATIVE 06/17/2019 1600    Microbiology Recent Results (from the past 240 hour(s))  Respiratory Panel by RT PCR (Flu A&B, Covid) - Nasopharyngeal Swab     Status: None   Collection Time: 10/15/19 10:42 PM   Specimen: Nasopharyngeal Swab  Result Value Ref Range Status   SARS Coronavirus 2 by RT PCR NEGATIVE NEGATIVE Final    Comment: (NOTE) SARS-CoV-2 target nucleic acids are NOT DETECTED. The SARS-CoV-2 RNA is generally detectable in upper respiratoy specimens during the acute phase of infection. The lowest concentration of SARS-CoV-2 viral copies this assay can detect is 131 copies/mL. A negative result does not preclude SARS-Cov-2 infection and should not be used as the sole basis for treatment or other patient management decisions. A negative result may occur with  improper specimen collection/handling, submission of specimen other than nasopharyngeal swab, presence of viral mutation(s) within the areas targeted by this assay, and inadequate number of viral copies (<131 copies/mL). A negative result must be combined with clinical observations, patient history, and epidemiological information. The expected result  is Negative. Fact Sheet for Patients:  PinkCheek.be Fact Sheet for Healthcare Providers:  GravelBags.it This test is not yet ap proved or cleared by the [place] FDA and  has been authorized for detection and/or diagnosis of SARS-CoV-2 by FDA under an Emergency Use Authorization (EUA). This EUA will remain  in effect (meaning this test can be used) for the duration of the COVID-19 declaration under Section 564(b)(1) of the Act, 21 U.S.C. section 360bbb-3(b)(1), unless the authorization is terminated or revoked sooner.    Influenza A by PCR NEGATIVE NEGATIVE Final   Influenza B by PCR NEGATIVE NEGATIVE Final    Comment: (NOTE) The Xpert Xpress SARS-CoV-2/FLU/RSV assay is intended as an aid in  the diagnosis of influenza from Nasopharyngeal swab specimens and  should not be used as a sole basis for treatment. Nasal washings and  aspirates are unacceptable for Xpert Xpress SARS-CoV-2/FLU/RSV  testing. Fact Sheet for Patients: PinkCheek.be Fact Sheet for Healthcare Providers: GravelBags.it This test is not yet approved or cleared by the [place] FDA and  has been authorized for detection and/or diagnosis of SARS-CoV-2 by  FDA under an Emergency Use Authorization (EUA). This EUA will remain  in effect (meaning this test can be used) for the duration of the  Covid-19 declaration under Section 564(b)(1) of the Act, 21  U.S.C. section 360bbb-3(b)(1), unless the authorization is  terminated or revoked. Performed at [facility], [place] [address], [place], [place] [address]   MRSA PCR Screening     Status: None   Collection Time: 10/16/19 12:17 AM   Specimen: Nasal Mucosa; Nasopharyngeal  Result Value Ref Range Status  MRSA by PCR NEGATIVE NEGATIVE Final    Comment:        The GeneXpert MRSA Assay (FDA approved for NASAL specimens only), is one component of  a comprehensive MRSA colonization surveillance program. It is not intended to diagnose MRSA infection nor to guide or monitor treatment for MRSA infections. Performed at [facility], [place] [address] [place],  [address]     Time coordinating discharge: Approximately 40 minutes  Patrecia Pour, MD  [facility] 10/19/2019, 11:47 AM

## 2019-10-19 NOTE — Progress Notes (Signed)
Patient discharged from facility.  Left with all belongings.

## 2019-10-19 NOTE — Progress Notes (Signed)
AVS reviewed in detail with patient and sister on telephone.  All questions answered to their satisfaction.  Patient educated on how often to check sugars, what is a low sugar and at what point to take his insulin.  Patient to follow up with endo on Monday 10/21/19.

## 2019-10-21 ENCOUNTER — Other Ambulatory Visit: Payer: Self-pay | Admitting: Pharmacist

## 2019-10-21 ENCOUNTER — Ambulatory Visit (INDEPENDENT_AMBULATORY_CARE_PROVIDER_SITE_OTHER): Payer: Medicare Other | Admitting: Family

## 2019-10-21 ENCOUNTER — Ambulatory Visit (INDEPENDENT_AMBULATORY_CARE_PROVIDER_SITE_OTHER): Payer: Medicare Other | Admitting: Endocrinology

## 2019-10-21 ENCOUNTER — Encounter: Payer: Self-pay | Admitting: Family

## 2019-10-21 ENCOUNTER — Encounter: Payer: Self-pay | Admitting: Endocrinology

## 2019-10-21 ENCOUNTER — Other Ambulatory Visit: Payer: Self-pay

## 2019-10-21 ENCOUNTER — Telehealth: Payer: Self-pay | Admitting: *Deleted

## 2019-10-21 ENCOUNTER — Telehealth (HOSPITAL_COMMUNITY): Payer: Self-pay | Admitting: Licensed Clinical Social Worker

## 2019-10-21 VITALS — BP 100/70 | HR 106 | Ht 64.0 in | Wt 158.0 lb

## 2019-10-21 VITALS — BP 110/60 | HR 94 | Temp 97.5°F | Ht 64.0 in | Wt 157.0 lb

## 2019-10-21 DIAGNOSIS — Z794 Long term (current) use of insulin: Secondary | ICD-10-CM

## 2019-10-21 DIAGNOSIS — E1140 Type 2 diabetes mellitus with diabetic neuropathy, unspecified: Secondary | ICD-10-CM

## 2019-10-21 DIAGNOSIS — E785 Hyperlipidemia, unspecified: Secondary | ICD-10-CM | POA: Diagnosis not present

## 2019-10-21 DIAGNOSIS — I10 Essential (primary) hypertension: Secondary | ICD-10-CM

## 2019-10-21 DIAGNOSIS — I5022 Chronic systolic (congestive) heart failure: Secondary | ICD-10-CM

## 2019-10-21 DIAGNOSIS — Z0289 Encounter for other administrative examinations: Secondary | ICD-10-CM

## 2019-10-21 DIAGNOSIS — I480 Paroxysmal atrial fibrillation: Secondary | ICD-10-CM | POA: Diagnosis not present

## 2019-10-21 DIAGNOSIS — R2681 Unsteadiness on feet: Secondary | ICD-10-CM

## 2019-10-21 DIAGNOSIS — R630 Anorexia: Secondary | ICD-10-CM

## 2019-10-21 DIAGNOSIS — H538 Other visual disturbances: Secondary | ICD-10-CM

## 2019-10-21 LAB — CBC WITH DIFFERENTIAL/PLATELET
Absolute Monocytes: 497 cells/uL (ref 200–950)
Basophils Absolute: 29 cells/uL (ref 0–200)
Basophils Relative: 0.4 %
Eosinophils Absolute: 79 cells/uL (ref 15–500)
Eosinophils Relative: 1.1 %
HCT: 46.3 % (ref 38.5–50.0)
Hemoglobin: 15.5 g/dL (ref 13.2–17.1)
Lymphs Abs: 2614 cells/uL (ref 850–3900)
MCH: 30.6 pg (ref 27.0–33.0)
MCHC: 33.5 g/dL (ref 32.0–36.0)
MCV: 91.5 fL (ref 80.0–100.0)
MPV: 10.4 fL (ref 7.5–12.5)
Monocytes Relative: 6.9 %
Neutro Abs: 3982 cells/uL (ref 1500–7800)
Neutrophils Relative %: 55.3 %
Platelets: 338 10*3/uL (ref 140–400)
RBC: 5.06 10*6/uL (ref 4.20–5.80)
RDW: 11.8 % (ref 11.0–15.0)
Total Lymphocyte: 36.3 %
WBC: 7.2 10*3/uL (ref 3.8–10.8)

## 2019-10-21 LAB — BASIC METABOLIC PANEL WITH GFR
BUN: 17 mg/dL (ref 7–25)
CO2: 29 mmol/L (ref 20–32)
Calcium: 9.7 mg/dL (ref 8.6–10.3)
Chloride: 100 mmol/L (ref 98–110)
Creat: 1.19 mg/dL (ref 0.70–1.33)
GFR, Est African American: 77 mL/min/{1.73_m2} (ref >=60)
GFR, Est Non African American: 66 mL/min/{1.73_m2} (ref >=60)
Glucose, Bld: 162 mg/dL — ABNORMAL HIGH (ref 65–139)
Potassium: 4.9 mmol/L (ref 3.5–5.3)
Sodium: 138 mmol/L (ref 135–146)

## 2019-10-21 LAB — GLUCOSE, POCT (MANUAL RESULT ENTRY): POC Glucose: 188 mg/dl — AB (ref 70–99)

## 2019-10-21 LAB — TSH: TSH: 1.72 mIU/L (ref 0.40–4.50)

## 2019-10-21 MED ORDER — MIRTAZAPINE 7.5 MG PO TABS: 7.5000 mg | ORAL_TABLET | Freq: Every day | ORAL | 0 refills | Status: DC

## 2019-10-21 MED ORDER — TRESIBA FLEXTOUCH 200 UNIT/ML ~~LOC~~ SOPN: 10.0000 [IU] | PEN_INJECTOR | Freq: Every day | SUBCUTANEOUS | Status: DC

## 2019-10-21 NOTE — Patient Outreach (Signed)
[facility])  [place] 10/21/2019  Loel Betancur 09-02-1960 [id number]  Patient hospitalized 4/13-4/17 for severe hypoglycemia from [place] insulin requiring D10 continuous infusion.   Incoming call from Marylouise Stacks, EMT.  She spoke with patient earlier today and patient reported he misplaced his pillbox and all pill-bottles since his hospitalization.  He cannot find any of his oral medications and has not taken any medication since discharge on 4/17.  He still has insulin which he will not resume until he speaks with endocrinologist.  He has f/u appt today with PCP and endocrinologist.   Per review of patient's chart and AVS, he remains on 7 oral medications from [facility] mail order (amitriptyline, carvedilol, digoxin, donepezil, lisinopril, ezetimibe, spironolactone) and 1 oral medication from [facility] (Eliquis) Call placed with patient.  He is agreeable to 3-way call with [facility].  Spoke with representative who will put in request for override and expedited shipping.  Request will need to be reviewed and [facility] will call me back with update.    Ralene Bathe, PharmD, [place] [phone]

## 2019-10-21 NOTE — Progress Notes (Signed)
Provider: Jamel Dunton FNP-C   [name], NP  [name], NP as PCP - General (Geriatric Medicine) [name], MD as PCP - Cardiology (Cardiology) [name], [name], MD as PCP - Advanced Heart Failure (Cardiology) [name], MD as Consulting Physician (Endocrinology) Rudean Haskell, [facility] as [place] Management (Pharmacist) [name], RN as [place] Management  Extended Emergency Contact Information Primary Emergency Contact: Herring,Martha Address: [address]          [place], [place] [address] [place] of [place] Phone: [phone] Relation: Other Secondary Emergency Contact: [facility] Address: [address]          [place], [place] [address] [place] Phone: [phone] Mobile Phone: [phone] Relation: Sister  Code Status: Full code  Goals of care: Advanced Directive information Advanced Directives 10/15/2019  Does Patient Have a Medical Advance Directive? No  Type of Advance Directive -  Does patient want to make changes to medical advance directive? -  Copy of [place] in Chart? -  Would patient like information on creating a medical advance directive? No - Patient declined     Chief Complaint  Patient presents with  . Transitions Of Care    10/21/2019  discharged    HPI:  Pt is a 59 y.o. male seen today for transition of care post hospital admission 10/15/2019 - 4/ 17/2021 for Hypoglycemia after recent change from basaglar to tresiba.His glucose level was < 10 and dextrose push was given.His blood sugars continued to decrease despite decreasing insulin from 280 units daily to 180 units daily.He had Dextrose infusion which stabilized his blood sugars.He was discharged to follow up with Endocrinologist within 48-72 hours. He states had a visit today with Dr.sean at  [place] was advised to resume  [place] at 10 units daily.He was also advised to eat meals on a regular schedule while on insulin.He states CBG running in the 100's -170's s except had an episode x 1 of 240 after eating something sweet.  He states does not have any appetite.Request medication to help with his appetite.He has had a progressive weight loss: wt 160 lbs ( 10/09/2019); wt 158 lbs ( 10/21/2019). Also complains of blurry vision has not seen eye doctor in a long time.states symptoms were worst when his blood sugars were low.  Hypertension - No home record readings for evaluation.He does not have a blood pressure machine at home but states blood pressure has been normal whenever home health Nurse visit.on Lisinopril 10 mg tablet daily,carvedilol 6.25 mg tablet twice daily and Aldactone 25 mg tablet daily.Has not required his nitro 0.4 mg tablet for chest pain.No symptoms of hypotension.  Hyperlipidemia - on ezetimibe 10 mg tablet daily.state has poor appetite.latest LDL 81,TRG 70,chol 152 (04/08/2019)   Congestive Heart Failure - reports shortness of breath with exertion.denies any cough or edema.On Carvedilol 6.25 mg tablet twice daily, Lisinopril 10 mg tablet daily and Aldactone 25 mg tablet daily.Has had weight loss.Follows up with Cardiology   Afib - on EliQuis 5 mg tablet twice daily and digoxin 0.125 mg tablet daily.He denies any palpitation or chest pain.  He request DME placard filled due to his shortness of breath with exertion. He states his walker with seat comes hardy.whenever he is tried he turns around and sit.  He request all medication to be refilled states does not remember where he placed his medication except has his inhaler.He states  he tried to call [facility] mail pharmacy but was on the phone for a long time with no answer.CMA reviewed all medication and he does have refills on medication.    Past Medical History:  Diagnosis Date  . Adenoidal hypertrophy   . Adenomatous colon polyps 2012  . Arthritis     . Diabetes mellitus   . GERD (gastroesophageal reflux disease)   . Heart attack ([place])    [place] living in [place]  [name] Hx of adenomatous colonic polyps 08/18/2017  . Hyperlipidemia   . Hypertension   . Mild CAD    a. cath in 08/2017 showing mild nonobstructive CAD with scattered 20-30% stenosis.   . Nonischemic cardiomyopathy ([place])    a. EF 20-25% by echo in 09/2017 with cath showing mild CAD. b.  Last echo 12/2017 EF 35-40%, grade 2 DD.  [name] Obesity   . PAF (paroxysmal atrial fibrillation) ([place])   . Sleep apnea    cpap, pt says no longer has   Past Surgical History:  Procedure Laterality Date  . COLONOSCOPY  05/13/11   9 adenomas  . FOREARM SURGERY    . MUSCLE BIOPSY    . RIGHT/LEFT HEART CATH AND CORONARY ANGIOGRAPHY N/A 08/25/2017   Procedure: RIGHT/LEFT HEART CATH AND CORONARY ANGIOGRAPHY;  Surgeon: [name], MD;  Location: [facility];  Service: Cardiovascular;  Laterality: N/A;  . RIGHT/LEFT HEART CATH AND CORONARY ANGIOGRAPHY N/A 10/01/2019   Procedure: RIGHT/LEFT HEART CATH AND CORONARY ANGIOGRAPHY;  Surgeon: [name], MD;  Location: [facility];  Service: Cardiovascular;  Laterality: N/A;    No Known Allergies  Allergies as of 10/21/2019   No Known Allergies     Medication List       Accurate as of October 21, 2019  3:45 PM. If you have any questions, ask your nurse or doctor.        albuterol 108 (90 Base) MCG/ACT inhaler Commonly known as: VENTOLIN HFA Inhale 2 puffs into the lungs every 6 (six) hours as needed for wheezing or shortness of breath.   amitriptyline 10 MG tablet Commonly known as: ELAVIL Take 2 tablets at bed time What changed:   how much to take  how to take this  when to take this  additional instructions   apixaban 5 MG Tabs tablet Commonly known as: ELIQUIS Take 5 mg by mouth 2 (two) times daily.   carvedilol 6.25 MG tablet Commonly known as: COREG Take 1 tablet (6.25 mg total) by mouth 2 (two) times  daily with a meal.   digoxin 0.125 MG tablet Commonly known as: LANOXIN Take 1 tablet (0.125 mg total) by mouth daily.   donepezil 10 MG tablet Commonly known as: ARICEPT Take 1 tablet (10 mg total) by mouth at bedtime.   ezetimibe 10 MG tablet Commonly known as: ZETIA Take 1 tablet (10 mg total) by mouth daily.   INSULIN SYRINGE .3CC/29GX1/2" 29G X 1/2" 0.3 ML Misc Inject 1 Syringe 3 (three) times daily as directed. Check blood sugar three times daily. Dx: E11.9 What changed: when to take this   lisinopril 10 MG tablet Commonly known as: ZESTRIL Take 1 tablet (10 mg total) by mouth at bedtime.   nitroGLYCERIN 0.4 MG SL tablet Commonly known as: NITROSTAT Place 1 tablet (0.4 mg total) under the tongue every 5 (five) minutes as needed for chest pain.   OneTouch Delica Plus [id number] Misc 1 each by Does not apply route 2 (two) times daily. E11.9  OneTouch Ultra test strip Generic drug: glucose blood 1 each by Other route 2 (two) times daily. E11.9   polyethylene glycol 17 g packet Commonly known as: MIRALAX / GLYCOLAX Take 17 g by mouth 2 (two) times daily.   Repatha SureClick 975 MG/ML Soaj Generic drug: Evolocumab Inject 1 pen into the skin every 14 (fourteen) days.   senna 8.6 MG Tabs tablet Commonly known as: SENOKOT Take 1 tablet (8.6 mg total) by mouth daily as needed for mild constipation.   spironolactone 25 MG tablet Commonly known as: ALDACTONE Take 1 tablet (25 mg total) by mouth daily.   [name] FlexTouch 200 UNIT/ML FlexTouch Pen Generic drug: insulin degludec Inject 10 Units into the skin daily. If blood sugar rises consistently above 200 mg/dl, inject 12 units once daily What changed:   how much to take  how to take this  when to take this Changed by: [name], MD       Review of Systems  Constitutional: Positive for fatigue. Negative for appetite change, chills and fever.  HENT: Negative for congestion, hearing loss, postnasal drip,  rhinorrhea, sinus pressure, sinus pain, sneezing, sore throat and trouble swallowing.   Eyes: Positive for visual disturbance. Negative for discharge, redness and itching.       Occasional blurry vision   Respiratory: Negative for cough, chest tightness, shortness of breath and wheezing.        Shortness of breath with exertion   Cardiovascular: Negative for chest pain, palpitations and leg swelling.  Gastrointestinal: Negative for abdominal distention, abdominal pain, blood in stool, diarrhea, nausea and vomiting.       Miralax and senokot effective   Endocrine: Negative for cold intolerance, heat intolerance, polydipsia, polyphagia and polyuria.  Genitourinary: Negative for difficulty urinating, dysuria, flank pain, frequency and urgency.  Musculoskeletal: Positive for gait problem. Negative for arthralgias and back pain.  Skin: Negative for color change, pallor and rash.  Neurological: Negative for dizziness, speech difficulty, weakness, numbness and headaches.       Lightheaded when blood sugars are low.   Hematological: Does not bruise/bleed easily.  Psychiatric/Behavioral: Negative for agitation, behavioral problems and sleep disturbance. The patient is not nervous/anxious.     Immunization History  Administered Date(s) Administered  . Influenza,inj,Quad PF,6+ Mos 05/10/2017, 05/02/2019  . Influenza-Unspecified 04/02/2014  . PFIZER SARS-COV-2 Vaccination 10/10/2019  . Pneumococcal Conjugate-13 06/20/2017  . Pneumococcal Polysaccharide-23 05/02/2019  . Tdap 09/28/2015   Pertinent  Health Maintenance Due  Topic Date Due  . OPHTHALMOLOGY EXAM  Never done  . INFLUENZA VACCINE  02/02/2020  . HEMOGLOBIN A1C  04/09/2020  . COLONOSCOPY  08/10/2020  . FOOT EXAM  10/08/2020   Fall Risk  10/09/2019 08/28/2019 08/26/2019 07/17/2019 07/15/2019  Falls in the past year? 1 1 0 1 0  Number falls in past yr: 1 1 0 1 0  Injury with Fall? 0 0 0 0 0  Risk for fall due to : History of  fall(s);Impaired balance/gait History of fall(s);Impaired balance/gait - History of fall(s);Impaired balance/gait No Fall Risks  Follow up - - - - -    Vitals:   10/21/19 1526  BP: 110/60  Pulse: 94  Temp: (!) 97.5 F (36.4 C)  TempSrc: Oral  SpO2: 99%  Weight: 157 lb (71.2 kg)  Height: 5' 4"  (1.626 m)   Body mass index is 26.95 kg/m. Physical Exam Vitals reviewed.  Constitutional:      General: He is not in acute distress.    Appearance: He  is overweight. He is not ill-appearing.  HENT:     Head: Normocephalic.     Right Ear: Tympanic membrane, ear canal and external ear normal. There is no impacted cerumen.     Left Ear: Tympanic membrane, ear canal and external ear normal. There is no impacted cerumen.     Nose: Nose normal. No congestion or rhinorrhea.     Mouth/Throat:     Mouth: Mucous membranes are moist.     Pharynx: Oropharynx is clear. No oropharyngeal exudate or posterior oropharyngeal erythema.  Eyes:     General: No scleral icterus.       Right eye: No discharge.        Left eye: No discharge.     Extraocular Movements: Extraocular movements intact.     Conjunctiva/sclera: Conjunctivae normal.     Pupils: Pupils are equal, round, and reactive to light.  Neck:     Vascular: No carotid bruit.  Cardiovascular:     Rate and Rhythm: Normal rate and regular rhythm.     Pulses: Normal pulses.     Heart sounds: Normal heart sounds. No murmur. No friction rub. No gallop.   Pulmonary:     Effort: Pulmonary effort is normal. No respiratory distress.     Breath sounds: Normal breath sounds. No wheezing, rhonchi or rales.  Chest:     Chest wall: No tenderness.  Abdominal:     General: Bowel sounds are normal. There is no distension.     Palpations: Abdomen is soft. There is no mass.     Tenderness: There is no abdominal tenderness. There is no right CVA tenderness, left CVA tenderness, guarding or rebound.  Musculoskeletal:        General: No swelling or  tenderness.     Cervical back: Normal range of motion. No rigidity or tenderness.     Right lower leg: No edema.     Left lower leg: No edema.     Comments: Unsteady gait ambulates with a Rolator.   Lymphadenopathy:     Cervical: No cervical adenopathy.  Skin:    General: Skin is warm and dry.     Coloration: Skin is not pale.     Findings: No bruising, erythema or rash.  Neurological:     Mental Status: He is alert and oriented to person, place, and time.     Cranial Nerves: No cranial nerve deficit.     Sensory: No sensory deficit.     Motor: No weakness.     Coordination: Coordination normal.     Gait: Gait abnormal.  Psychiatric:        Mood and Affect: Mood normal.        Behavior: Behavior normal.        Thought Content: Thought content normal.        Judgment: Judgment normal.     Labs reviewed: Recent Labs    06/17/19 1240 06/17/19 1650 06/28/19 1240 06/28/19 1819 06/29/19 0250 07/13/19 2042 10/16/19 0403 10/17/19 0419 10/18/19 0607  NA 133*   < >  --    < > 134*   < > 138 140 138  K 4.0   < >  --    < > 4.7   < > 3.9 3.5 4.2  CL 97*   < >  --    < > 96*   < > 103 104 102  CO2 22   < >  --    < > 25   < >  26 29 25   GLUCOSE 254*   < >  --    < > 143*   < > 146* 66* 138*  BUN 21*   < >  --    < > 26*   < > 8 8 7   CREATININE 1.65*   < >  --    < > 1.00   < > 1.05 1.06 1.08  CALCIUM 8.6*   < >  --    < > 8.6*   < > 8.7* 8.4* 8.5*  MG 2.4   < > 2.6*  --  2.0  --  1.9  --   --   PHOS 3.1  --   --   --   --   --   --   --   --    < > = values in this interval not displayed.   Recent Labs    06/27/19 0205 06/27/19 0205 06/29/19 0250 08/28/19 0943 10/13/19 2202  AST 29   < > 23 12 28   ALT 23   < > 23 12 28   ALKPHOS 63  --  54  --  58  BILITOT 0.5   < > 0.4 0.4 0.4  PROT 6.3*   < > 5.6* 5.8* 6.2*  ALBUMIN 2.7*  --  2.6*  --  3.5   < > = values in this interval not displayed.   Recent Labs    07/13/19 2042 07/13/19 2042 08/28/19 0943 09/27/19 1527  10/13/19 2202 10/13/19 2211 10/15/19 1345 10/15/19 1345 10/15/19 1359 10/15/19 1400 10/16/19 0403  WBC 6.1   < > 4.6   < > 4.7  --  8.1  --   --   --  7.8  NEUTROABS 3.3  --  2,130  --  3.6  --   --   --   --   --   --   HGB 14.5   < > 13.2   < > 14.7   < > 15.0   < > 15.0 15.3 14.0  HCT 42.9   < > 38.9   < > 43.6   < > 45.5   < > 44.0 45.0 41.3  MCV 89.9   < > 92.0   < > 93.0  --  91.5  --   --   --  90.8  PLT 162   < > 232   < > 203  --  290  --   --   --  252   < > = values in this interval not displayed.   Lab Results  Component Value Date   TSH 2.428 06/17/2019   Lab Results  Component Value Date   HGBA1C 9.2 (A) 10/09/2019   Lab Results  Component Value Date   CHOL 152 04/08/2019   HDL 57 04/08/2019   LDLCALC 81 04/08/2019   TRIG 70 04/08/2019   CHOLHDL 2.7 04/08/2019    Significant Diagnostic Results in last 30 days:  CARDIAC CATHETERIZATION  Result Date: 10/01/2019  Mid LAD lesion is 40% stenosed.  Prox Cx to Dist Cx lesion is 40% stenosed.  Prox RCA to Mid RCA lesion is 30% stenosed.  Mid RCA lesion is 50% stenosed.  Dist RCA lesion is 30% stenosed.  Dist LAD lesion is 60% stenosed.  Findings: Ao = 123/67 (91) LV = 123/10 RA =  3 RV = 22/4 PA = 21/8 (14) PCW = 8 Fick cardiac output/index = 7.4/4.3 PVR = 0.8 WU Ao  sat = 100% PA sat = 79%, 80% High SVC sat = 85% Assessment: 1. Moderate non-obstructive CAD 2. NICM EF 35-40% 3. Normal filling pressures 4. High cardiac output without evidence of intracardiac shunting Plan/Discussion: Medical therapy. Consider w/u for peripheral shunting process. Glori Bickers, MD 8:46 AM   ECHOCARDIOGRAM COMPLETE  Result Date: 09/27/2019    ECHOCARDIOGRAM REPORT   Patient Name:   JERAY SHUGART Date of Exam: 09/27/2019 Medical Rec #:  [id number]          Height:       64.0 in Accession #:    [id number]         Weight:       158.0 lb Date of Birth:  Jan 07, 1961         BSA:          1.770 m Patient Age:    50 years           BP:            96/62 mmHg Patient Gender: M                  HR:           88 bpm. Exam Location:  Outpatient Procedure: 2D Echo Indications:    congestive heart faillure 428.0  History:        Patient has prior history of Echocardiogram examinations, most                 recent 01/25/2019.  Sonographer:    Johny Chess RDCS Referring Phys: [place]  1. Moderate to severe global reduction in LV systolic function; mild LVH; grade 1 diastolic dysfunction.  2. Left ventricular ejection fraction, by estimation, is 30 to 35%. The left ventricle has moderate to severely decreased function. The left ventricle demonstrates global hypokinesis. There is mild left ventricular hypertrophy. Left ventricular diastolic parameters are consistent with Grade I diastolic dysfunction (impaired relaxation).  3. Right ventricular systolic function is normal. The right ventricular size is normal.  4. The mitral valve is normal in structure. Trivial mitral valve regurgitation. No evidence of mitral stenosis.  5. The aortic valve is tricuspid. Aortic valve regurgitation is not visualized. Mild aortic valve sclerosis is present, with no evidence of aortic valve stenosis.  6. The inferior vena cava is normal in size with greater than 50% respiratory variability, suggesting right atrial pressure of 3 mmHg. FINDINGS  Left Ventricle: Left ventricular ejection fraction, by estimation, is 30 to 35%. The left ventricle has moderate to severely decreased function. The left ventricle demonstrates global hypokinesis. The left ventricular internal cavity size was normal in size. There is mild left ventricular hypertrophy. Left ventricular diastolic parameters are consistent with Grade I diastolic dysfunction (impaired relaxation). Right Ventricle: The right ventricular size is normal.Right ventricular systolic function is normal. Left Atrium: Left atrial size was normal in size. Right Atrium: Right atrial size was normal in  size. Pericardium: There is no evidence of pericardial effusion. Mitral Valve: The mitral valve is normal in structure. Normal mobility of the mitral valve leaflets. Mild mitral annular calcification. Trivial mitral valve regurgitation. No evidence of mitral valve stenosis. Tricuspid Valve: The tricuspid valve is normal in structure. Tricuspid valve regurgitation is trivial. No evidence of tricuspid stenosis. Aortic Valve: The aortic valve is tricuspid. Aortic valve regurgitation is not visualized. Mild aortic valve sclerosis is present, with no evidence of aortic valve stenosis. Pulmonic Valve: The pulmonic valve was normal in structure.  Pulmonic valve regurgitation is trivial. No evidence of pulmonic stenosis. Aorta: The aortic root is normal in size and structure. Venous: The inferior vena cava is normal in size with greater than 50% respiratory variability, suggesting right atrial pressure of 3 mmHg. IAS/Shunts: No atrial level shunt detected by color flow Doppler. Additional Comments: Moderate to severe global reduction in LV systolic function; mild LVH; grade 1 diastolic dysfunction.  LEFT VENTRICLE PLAX 2D LVIDd:         4.70 cm     Diastology LVIDs:         3.90 cm     LV e' lateral:   7.51 cm/s LV PW:         1.20 cm     LV E/e' lateral: 7.9 LV IVS:        1.20 cm     LV e' medial:    4.79 cm/s LVOT diam:     2.00 cm     LV E/e' medial:  12.4 LV SV:         46 LV SV Index:   26 LVOT Area:     3.14 cm  LV Volumes (MOD) LV vol d, MOD A2C: 71.3 ml LV vol d, MOD A4C: 68.9 ml LV vol s, MOD A2C: 47.2 ml LV vol s, MOD A4C: 47.0 ml LV SV MOD A2C:     24.1 ml LV SV MOD A4C:     68.9 ml LV SV MOD BP:      22.7 ml RIGHT VENTRICLE RV S prime:     9.25 cm/s TAPSE (M-mode): 1.3 cm LEFT ATRIUM             Index       RIGHT ATRIUM           Index LA diam:        3.80 cm 2.15 cm/m  RA Area:     11.70 cm LA Vol (A2C):   37.6 ml 21.24 ml/m RA Volume:   24.20 ml  13.67 ml/m LA Vol (A4C):   32.6 ml 18.42 ml/m LA Biplane  Vol: 36.0 ml 20.34 ml/m  AORTIC VALVE LVOT Vmax:   90.70 cm/s LVOT Vmean:  58.100 cm/s LVOT VTI:    0.145 m  AORTA Ao Root diam: 3.40 cm Ao Asc diam:  3.30 cm MV E velocity: 59.60 cm/s MV A velocity: 103.00 cm/s  SHUNTS MV E/A ratio:  0.58         Systemic VTI:  0.14 m                             Systemic Diam: 2.00 cm Kirk Ruths MD Electronically signed by Kirk Ruths MD Signature Date/Time: 09/27/2019/3:55:00 PM    Final     Assessment/Plan 1. Type 2 diabetes mellitus with diabetic neuropathy, with long-term current use of insulin ([place]) Status post hospital admission for hypoglycemia. - continue on Tresiba 10 units daily.follow up with Dr.sean at  [facility] as directed.  - Ambulatory referral to Ophthalmology for blurry vision and annual diabetic eye exam. - follow up with Podiatrist as ordered on 10/09/2019.  - Continue on ACE inhibitor for renal protection.    2. Essential hypertension B/p at goal.continue on Lisinopril 10 mg tablet daily,carvedilol 6.25 mg tablet twice daily and Aldactone 25 mg tablet daily.continue on Nitro 0.4 mg tablet for chest pain as needed.  - CBC with Differential/Platelet - BMP with eGFR([facility]) - TSH  3. Hyperlipidemia, unspecified hyperlipidemia type LDL at goal.continue on dietary modification.   4. Chronic systolic heart failure (HCC) Reports shortness of breath with exertion.No edema or abrupt weight gain.Lungs clear to auscultation.  - continue on  Lisinopril 10 mg tablet daily,carvedilol 6.25 mg tablet twice daily and Aldactone 25 mg tablet daily.Has not required his nitro 0.4 mg tablet for chest pain. - continue to follow with Cardiologist  5. Paroxysmal atrial fibrillation (HCC) HR controlled.continue on EliQuis 5 mg tablet twice daily and digoxin 0.125 mg tablet daily  6. Unsteady gait No recent fall episode. - fall and safety precautions. - continue to ambulate with Rolator. - DMV placard filled and signed.given to patient.     7. Blurry vision, bilateral Worsening blurry vision.will refer to ophthalmology for evaluation and annual diabetic eye exam.  - Ambulatory referral to Ophthalmology  8. Poor appetite Has had progressive weight loss. - Remeron 7.5 mg tablet one by mouth daily at bedtime.side effects discussed verbalized understanding.  - follow up in one month to reevaluate loss of appetite.   9. Encounter for other administrative examinations Has shortness of breath with exertion secondary to his congestive heart failure / Afib and ambulates with walker due to unsteady gait.  - DMV placard filled and signed.given to patient.   Family/ staff Communication: Reviewed plan of care with patient verbalized understanding.   Labs/tests ordered:  - CBC with Differential/Platelet - BMP with eGFR([facility]) - TSH   Next Appointment : 1 month for evaluation of poor appetite.   Sandrea Hughs, NP

## 2019-10-21 NOTE — Patient Instructions (Signed)
Start on Remeron 7.5 mg tablet one by mouth at bedtime for appetite

## 2019-10-21 NOTE — Telephone Encounter (Signed)
Transition Care Management Follow-up Telephone Call  Date of discharge and from where: 10/19/2019 [place]  How have you been since you were released from the hospital? Still very tired. Wants appointment scheduled for today  Any questions or concerns? No   Items Reviewed:  Did the pt receive and understand the discharge instructions provided? Yes   Medications obtained and verified? Yes  Patient stated they told him to stop his insulin and follow up with PCP  Any new allergies since your discharge? No   Dietary orders reviewed? Yes  Do you have support at home? Yes   Other (ie: DME, Home Health, etc) Stated that hospital talked about [place] but no one has contacted him.   Functional Questionnaire: (I = Independent and D = Dependent) ADL's: I  Bathing/Dressing- I   Meal Prep- I  Eating- I  Maintaining continence- I  Transferring/Ambulation- I  Managing Meds- I --has someone filling his medication boxes for him.    Follow up appointments reviewed:    PCP Hospital f/u appt confirmed? Yes  Scheduled to see Dinah on 10/21/2019 @ 2:45.  [facility] f/u appt confirmed? No    Are transportation arrangements needed? No   If their condition worsens, is the pt aware to call  their PCP or go to the ED? Yes  Was the patient provided with contact information for the PCP's office or ED? Yes  Was the pt encouraged to call back with questions or concerns? Yes

## 2019-10-21 NOTE — Telephone Encounter (Signed)
Patient identified as a candidate to receive 7 diabetes friendly meals per week for 4 weeks through [facility].  Completed referral sent in for review.  Anticipate patient will receive first shipment of food in 1-3 business days.  Jorge Ny, LCSW Clinical Social Worker [facility] Desk#: [phone] Cell#: [phone]

## 2019-10-21 NOTE — Patient Instructions (Addendum)
Please resume the [place], at 10 units daily.  On this type of insulin, you should eat meals on a regular schedule.  If a meal is missed or significantly delayed, your blood sugar could go low.   check your blood sugar twice a day.  vary the time of day when you check, between before the 3 meals, and at bedtime.  also check if you have symptoms of your blood sugar being too high or too low.  please keep a record of the readings and bring it to your next appointment here (or you can bring the meter itself).  You can write it on any piece of paper.  please call us sooner if your blood sugar goes below 70, or if you have a lot of readings over 200. Please come back for a follow-up appointment in 1-2 weeks.

## 2019-10-21 NOTE — Progress Notes (Signed)
Subjective:    Patient ID: Calvin [name], male    DOB: 05-Sep-1960, 59 y.o.   MRN: [id number]  HPI Pt returns for f/u of diabetes mellitus: DM type: Insulin-requiring type 2. Dx'ed: 4403 Complications: polyneuropathy, CAD, renal insuff, and DR.   Therapy: insulin since soon after dx.  DKA: never Severe hypoglycemia: once, in 2021. Pancreatitis: never Pancreatic imaging: normal on 2003 CT. SDOH: due to noncompliance, he is not a candidate for multiple daily injections; he is retired; he gets insulin from pt assistance; changes between brands must be minimized, due to LHL. Interval history: He has been off insulin since hosp stay last week. no cbg record, but states cbg's are in the 200's.   Past Medical History:  Diagnosis Date  . Adenoidal hypertrophy   . Adenomatous colon polyps 2012  . Arthritis   . Diabetes mellitus   . GERD (gastroesophageal reflux disease)   . Heart attack ([place])    [place] in [place]  [name] Hx of adenomatous colonic polyps 08/18/2017  . Hyperlipidemia   . Hypertension   . Mild CAD    a. cath in 08/2017 showing mild nonobstructive CAD with scattered 20-30% stenosis.   . Nonischemic cardiomyopathy ([place])    a. EF 20-25% by echo in 09/2017 with cath showing mild CAD. b.  Last echo 12/2017 EF 35-40%, grade 2 DD.  [name] Obesity   . PAF (paroxysmal atrial fibrillation) ([place])   . Sleep apnea    cpap, pt says no longer has    Past Surgical History:  Procedure Laterality Date  . COLONOSCOPY  05/13/11   9 adenomas  . FOREARM SURGERY    . MUSCLE BIOPSY    . RIGHT/LEFT HEART CATH AND CORONARY ANGIOGRAPHY N/A 08/25/2017   Procedure: RIGHT/LEFT HEART CATH AND CORONARY ANGIOGRAPHY;  Surgeon: [name], MD;  Location: [facility];  Service: Cardiovascular;  Laterality: N/A;  . RIGHT/LEFT HEART CATH AND CORONARY ANGIOGRAPHY N/A 10/01/2019   Procedure: RIGHT/LEFT HEART CATH AND CORONARY ANGIOGRAPHY;  Surgeon: [name], MD;  Location: [facility];  Service: Cardiovascular;  Laterality: N/A;    Social History   Socioeconomic History  . Marital status: Single    Spouse name: Not on file  . Number of children: 0  . Years of education: Not on file  . Highest education level: Not on file  Occupational History  . Occupation: Disability  Tobacco Use  . Smoking status: Never Smoker  . Smokeless tobacco: Never Used  Substance and Sexual Activity  . Alcohol use: No  . Drug use: No  . Sexual activity: Yes    Birth control/protection: None  Other Topics Concern  . Not on file  Social History Narrative   Social History   Diet?    Do you drink/eat things with caffeine? yes   Marital status?       single      Do you live in a house, apartment, assisted living, condo, trailer, etc.? yes   Is it one or more stories? One story   How many persons live in your home?   Do you have any pets in your home? (please list)    Highest level of education completed? graduate   Do you exercise?            no                          Type &  how often?   Advanced Directives   Do you have a living will? no   Do you have a DNR form?                                  If not, do you want to discuss one? no   Do you have signed POA/HPOA for forms? no      Functional Status   Do you have difficulty bathing or dressing yourself? no   Do you have difficulty preparing food or eating? no   Do you have difficulty managing your medications? no   Do you have difficulty managing your finances? no   Do you have difficulty affording your medications? no   Social Determinants of Health   Financial Resource Strain:   . Difficulty of Paying Living Expenses:   Food Insecurity: No Food Insecurity  . Worried About Charity fundraiser in the Last Year: Never true  . Ran Out of Food in the Last Year: Never true  Transportation Needs: Unmet Transportation Needs  . Lack of Transportation (Medical): Yes  . Lack of Transportation (Non-Medical): Yes    Physical Activity:   . Days of Exercise per Week:   . Minutes of Exercise per Session:   Stress:   . Feeling of Stress :   Social Connections:   . Frequency of Communication with Friends and Family:   . Frequency of Social Gatherings with Friends and Family:   . Attends Religious Services:   . Active Member of Clubs or Organizations:   . Attends Archivist Meetings:   [name] Marital Status:   Intimate Partner Violence:   . Fear of Current or Ex-Partner:   . Emotionally Abused:   [name] Physically Abused:   . Sexually Abused:     Current Outpatient Medications on File Prior to Visit  Medication Sig Dispense Refill  . albuterol (VENTOLIN HFA) 108 (90 Base) MCG/ACT inhaler Inhale 2 puffs into the lungs every 6 (six) hours as needed for wheezing or shortness of breath. 6.7 g 0  . amitriptyline (ELAVIL) 10 MG tablet Take 2 tablets at bed time (Patient taking differently: Take 20 mg by mouth at bedtime. ) 180 tablet 3  . apixaban (ELIQUIS) 5 MG TABS tablet Take 5 mg by mouth 2 (two) times daily.    . carvedilol (COREG) 6.25 MG tablet Take 1 tablet (6.25 mg total) by mouth 2 (two) times daily with a meal. 180 tablet 3  . digoxin (LANOXIN) 0.125 MG tablet Take 1 tablet (0.125 mg total) by mouth daily. 90 tablet 3  . donepezil (ARICEPT) 10 MG tablet Take 1 tablet (10 mg total) by mouth at bedtime. 90 tablet 3  . Evolocumab (REPATHA SURECLICK) 102 MG/ML SOAJ Inject 1 pen into the skin every 14 (fourteen) days. 6 pen 3  . ezetimibe (ZETIA) 10 MG tablet Take 1 tablet (10 mg total) by mouth daily. 90 tablet 3  . glucose blood (ONETOUCH ULTRA) test strip 1 each by Other route 2 (two) times daily. E11.9 180 each 0  . Insulin Syringe-Needle U-100 (INSULIN SYRINGE .3CC/29GX1/2") 29G X 1/2" 0.3 ML MISC Inject 1 Syringe 3 (three) times daily as directed. Check blood sugar three times daily. Dx: E11.9 (Patient taking differently: Inject 1 Syringe as directed See admin instructions. Check blood sugar  three times daily. Dx: E11.9 ) 100 each 3  . Lancets (ONETOUCH DELICA PLUS [id number]) [place]  1 each by Does not apply route 2 (two) times daily. E11.9 180 each 0  . lisinopril (ZESTRIL) 10 MG tablet Take 1 tablet (10 mg total) by mouth at bedtime. 90 tablet 3  . nitroGLYCERIN (NITROSTAT) 0.4 MG SL tablet Place 1 tablet (0.4 mg total) under the tongue every 5 (five) minutes as needed for chest pain. (Patient not taking: Reported on 10/22/2019) 30 tablet 1  . polyethylene glycol (MIRALAX / GLYCOLAX) 17 g packet Take 17 g by mouth 2 (two) times daily. 14 each 0  . senna (SENOKOT) 8.6 MG TABS tablet Take 1 tablet (8.6 mg total) by mouth daily as needed for mild constipation. 15 tablet 0  . spironolactone (ALDACTONE) 25 MG tablet Take 1 tablet (25 mg total) by mouth daily. 90 tablet 3   No current facility-administered medications on file prior to visit.    No Known Allergies  Family History  Problem Relation Age of Onset  . Heart disease Mother   . Heart attack Mother 25  . Hypertension Mother   . Hyperlipidemia Mother   . Diabetes Mother   . Heart disease Father   . Heart attack Father 22  . Hypertension Father   . Hyperlipidemia Father   . Diabetes Father   . Heart attack Sister 104  . Colon cancer Neg Hx   . Stomach cancer Neg Hx   . Esophageal cancer Neg Hx   . Rectal cancer Neg Hx     BP 100/70   Pulse (!) 106   Ht 5' 4"  (1.626 m)   Wt 158 lb (71.7 kg)   SpO2 98%   BMI 27.12 kg/m    Review of Systems No weight change.     Objective:   Physical Exam VITAL SIGNS:  See vs page GENERAL: no distress Pulses: dorsalis pedis intact bilat.   MSK: no deformity of the feet CV: no leg edema Skin:  no ulcer on the feet.  normal color and temp on the feet. Neuro: sensation is intact to touch on the feet.   Lab Results  Component Value Date   CREATININE 1.08 10/18/2019   BUN 7 10/18/2019   NA 138 10/18/2019   K 4.2 10/18/2019   CL 102 10/18/2019   CO2 25 10/18/2019   Lab  Results  Component Value Date   TSH 2.428 06/17/2019        Assessment & Plan:  Insulin-requiring type 2 DM, with CAD: she needs to resume insulin Hypoglycemia, severe.  This was due to increased compliance with insulin. SDOH: it is important to minimize med changes  Patient Instructions  Please resume the [place], at 10 units daily.  On this type of insulin, you should eat meals on a regular schedule.  If a meal is missed or significantly delayed, your blood sugar could go low.   check your blood sugar twice a day.  vary the time of day when you check, between before the 3 meals, and at bedtime.  also check if you have symptoms of your blood sugar being too high or too low.  please keep a record of the readings and bring it to your next appointment here (or you can bring the meter itself).  You can write it on any piece of paper.  please call us sooner if your blood sugar goes below 70, or if you have a lot of readings over 200. Please come back for a follow-up appointment in 1-2 weeks.

## 2019-10-22 ENCOUNTER — Other Ambulatory Visit (HOSPITAL_COMMUNITY): Payer: Self-pay

## 2019-10-22 ENCOUNTER — Other Ambulatory Visit: Payer: Self-pay | Admitting: *Deleted

## 2019-10-22 NOTE — Progress Notes (Signed)
Paramedicine Encounter    Patient ID: Calvin [name], male    DOB: Oct 02, 1960, 59 y.o.   MRN: [id number]   [name], NP as PCP - General (Geriatric Medicine) [name], MD as PCP - Cardiology (Cardiology) [name], [name], MD as PCP - Advanced Heart Failure (Cardiology) [name], MD as Consulting Physician (Endocrinology) Rudean Haskell, [facility] as [place] Management (Pharmacist) [name], RN as [place] Management  Patient Active Problem List   Diagnosis Date Noted  . Hypoglycemia due to insulin 10/15/2019  . Chronic anticoagulation 10/15/2019  . COVID-19 virus detected 06/18/2019  . Right sided weakness 06/17/2019  . AKI (acute kidney injury) ([place]) 06/17/2019  . Foot pain, bilateral 02/01/2019  . Abdominal pain 01/23/2019  . Chest pain 01/23/2019  . Right leg pain 12/20/2018  . Overgrown toenails 12/20/2018  . Unsteady gait 12/20/2018  . Encounter for therapeutic drug monitoring 10/22/2018  . Nausea and vomiting 03/10/2018  . AF (paroxysmal atrial fibrillation) ([place]) 03/10/2018  . Non-cardiac chest pain 03/09/2018  . Diabetic neuropathy ([place]) 09/26/2017  . MCI (mild cognitive impairment) 06/23/2017  . OSA on CPAP 05/10/2017  . Gastroesophageal reflux disease 05/10/2017  . Insomnia 05/10/2017  . Onychomycosis of multiple toenails with type 2 diabetes mellitus and peripheral neuropathy ([place]) 05/10/2017  . Chronic systolic heart failure ([place]) 11/22/2015  . Essential hypertension 11/22/2015  . DM (diabetes mellitus) ([place]) 11/20/2015  . HLD (hyperlipidemia) 11/20/2015  . Hx of adenomatous colonic polyps 05/19/2011    Current Outpatient Medications:  .  albuterol (VENTOLIN HFA) 108 (90 Base) MCG/ACT inhaler, Inhale 2 puffs into the lungs every 6 (six) hours as needed for wheezing or shortness of breath., Disp: 6.7 g, Rfl: 0 .  amitriptyline (ELAVIL) 10 MG tablet, Take 2 tablets  at bed time (Patient taking differently: Take 20 mg by mouth at bedtime. ), Disp: 180 tablet, Rfl: 3 .  apixaban (ELIQUIS) 5 MG TABS tablet, Take 5 mg by mouth 2 (two) times daily., Disp: , Rfl:  .  carvedilol (COREG) 6.25 MG tablet, Take 1 tablet (6.25 mg total) by mouth 2 (two) times daily with a meal., Disp: 180 tablet, Rfl: 3 .  digoxin (LANOXIN) 0.125 MG tablet, Take 1 tablet (0.125 mg total) by mouth daily., Disp: 90 tablet, Rfl: 3 .  donepezil (ARICEPT) 10 MG tablet, Take 1 tablet (10 mg total) by mouth at bedtime., Disp: 90 tablet, Rfl: 3 .  Evolocumab (REPATHA SURECLICK) 888 MG/ML SOAJ, Inject 1 pen into the skin every 14 (fourteen) days., Disp: 6 pen, Rfl: 3 .  ezetimibe (ZETIA) 10 MG tablet, Take 1 tablet (10 mg total) by mouth daily., Disp: 90 tablet, Rfl: 3 .  glucose blood (ONETOUCH ULTRA) test strip, 1 each by Other route 2 (two) times daily. E11.9, Disp: 180 each, Rfl: 0 .  insulin degludec (TRESIBA FLEXTOUCH) 200 UNIT/ML FlexTouch Pen, Inject 10 Units into the skin daily. If blood sugar rises consistently above 200 mg/dl, inject 12 units once daily, Disp: , Rfl:  .  Insulin Syringe-Needle U-100 (INSULIN SYRINGE .3CC/29GX1/2") 29G X 1/2" 0.3 ML MISC, Inject 1 Syringe 3 (three) times daily as directed. Check blood sugar three times daily. Dx: E11.9 (Patient taking differently: Inject 1 Syringe as directed See admin instructions. Check blood sugar three times daily. Dx: E11.9 ), Disp: 100 each, Rfl: 3 .  Lancets (ONETOUCH DELICA PLUS [id number]) MISC, 1 each by Does not apply route 2 (two) times daily.  E11.9, Disp: 180 each, Rfl: 0 .  lisinopril (ZESTRIL) 10 MG tablet, Take 1 tablet (10 mg total) by mouth at bedtime., Disp: 90 tablet, Rfl: 3 .  mirtazapine (REMERON) 7.5 MG tablet, Take 1 tablet (7.5 mg total) by mouth at bedtime., Disp: 30 tablet, Rfl: 0 .  nitroGLYCERIN (NITROSTAT) 0.4 MG SL tablet, Place 1 tablet (0.4 mg total) under the tongue every 5 (five) minutes as needed for chest  pain., Disp: 30 tablet, Rfl: 1 .  polyethylene glycol (MIRALAX / GLYCOLAX) 17 g packet, Take 17 g by mouth 2 (two) times daily., Disp: 14 each, Rfl: 0 .  senna (SENOKOT) 8.6 MG TABS tablet, Take 1 tablet (8.6 mg total) by mouth daily as needed for mild constipation., Disp: 15 tablet, Rfl: 0 .  spironolactone (ALDACTONE) 25 MG tablet, Take 1 tablet (25 mg total) by mouth daily., Disp: 90 tablet, Rfl: 3 No Known Allergies    Social History   Socioeconomic History  . Marital status: Single    Spouse name: Not on file  . Number of children: 0  . Years of education: Not on file  . Highest education level: Not on file  Occupational History  . Occupation: Disability  Tobacco Use  . Smoking status: Never Smoker  . Smokeless tobacco: Never Used  Substance and Sexual Activity  . Alcohol use: No  . Drug use: No  . Sexual activity: Yes    Birth control/protection: None  Other Topics Concern  . Not on file  Social History Narrative   Social History   Diet?    Do you drink/eat things with caffeine? yes   Marital status?       single      Do you live in a house, apartment, assisted living, condo, trailer, etc.? yes   Is it one or more stories? One story   How many persons live in your home?   Do you have any pets in your home? (please list)    Highest level of education completed? graduate   Do you exercise?            no                          Type & how often?   Advanced Directives   Do you have a living will? no   Do you have a DNR form?                                  If not, do you want to discuss one? no   Do you have signed POA/HPOA for forms? no      Functional Status   Do you have difficulty bathing or dressing yourself? no   Do you have difficulty preparing food or eating? no   Do you have difficulty managing your medications? no   Do you have difficulty managing your finances? no   Do you have difficulty affording your medications? no   Social Determinants of Health    Financial Resource Strain:   . Difficulty of Paying Living Expenses:   Food Insecurity: No Food Insecurity  . Worried About Charity fundraiser in the Last Year: Never true  . Ran Out of Food in the Last Year: Never true  Transportation Needs: Unmet Transportation Needs  . Lack of Transportation (Medical): Yes  . Lack of Transportation (Non-Medical): Yes  Physical Activity:   . Days of Exercise per Week:   . Minutes of Exercise per Session:   Stress:   . Feeling of Stress :   Social Connections:   . Frequency of Communication with Friends and Family:   . Frequency of Social Gatherings with Friends and Family:   . Attends Religious Services:   . Active Member of Clubs or Organizations:   . Attends Archivist Meetings:   [name] Marital Status:   Intimate Partner Violence:   . Fear of Current or Ex-Partner:   . Emotionally Abused:   [name] Physically Abused:   . Sexually Abused:     Physical Exam      Future Appointments  Date Time Provider [place]  10/24/2019 11:00 AM [facility] LAB [facility] None  10/28/2019  3:00 PM [name], MD [facility] None  10/29/2019  9:30 AM [name], RN [facility]N-COM None  11/01/2019  3:45 PM Felipa Furnace, DPM [facility]  11/04/2019  8:15 AM [place] [facility] PEC  11/05/2019  3:45 PM Sum[place], Octavio Graves, RPH [facility]COM None  11/18/2019  3:15 PM [name], NP PSC-PSC None  12/04/2019  9:45 AM [name], MD [facility] None  01/20/2020  9:30 AM [name], NP PSC-PSC None  03/18/2020 11:30 AM Cameron Sprang, MD LBN-LBNG None    There were no vitals taken for this visit. CBG PTA-270 Weight yesterday-?? Last visit weight-158   Pt was not home at our scheduled time appointment. I called him when I arrived and he was at a store getting his phone fixed. I did ask how his CBG was this morning--as noted.  There was a medication added for bedtime use-remeron-looks like it was added for increased  appetite. Was not able to speak further about diabetic meds/doc visits. His family brought me his pill bottles that they found in his god-daughters car. I called him but no answer before I left. I sent him message relaying what is missing.    -missing his zetia, lisinopril, [place], [place] Paramedic  10/22/19

## 2019-10-22 NOTE — Patient Outreach (Signed)
[facility]) Care Management  10/22/2019  Calvin [name] 1961/03/23 [id number]   Notified that member was discharged from hospital over the weekend.  Call placed to follow up on condition, state he is much better.  Report blood sugar today was 220.  His Tresiba dose was decrease from 300 units to 10 units daily.  He was seen by his PCP and endocrinologist yesterday.  Report EMT [name]) will be visiting today with the paramedicine program.  State he had lost all of his medications prior to admission but has since found them.  Other than the Tresiba, no other changes.  He was placed on Remeron at yesterday's PCP visit for his compliant of decreased appetite, state he has received medication and started taking last night.  Also report that he will start receiving prepared meals within the next couple days, meals will follow diabetic diet.  Denies any urgent concerns at this time, aware of how to treat hypoglycemic events, will seek emergency medical attention if needed.  Will follow up within the next 2 weeks.    [facility] CM Care Plan Problem One     Most Recent Value  Care Plan Problem One  Knowledge deficit related to disease process and mgmt of Diabetes  Role Documenting the Problem One  Care Management Telephonic Coordinator  Care Plan for Problem One  Active  [facility] Long Term Goal   Patient will report a lowering/decrease in A1C level of 9 over the next 90 days.  [facility] Long Term Goal Start Date  10/22/19  Interventions for Problem One Long Term Goal  Discharge instructions reviewed with member.  Re-educated on importance of managing diabetes and complications on uncontrolled condition  [facility] CM Short Term Goal #1   Member will report taking all medications as instructed over the next 4 weeks, especially insulin  [facility] CM Short Term Goal #1 Start Date  10/22/19  Interventions for Short Term Goal #1  Medication changes reviewed with member.  Confirmed he does have medications in the  home  [facility] CM Short Term Goal #2   Member will report having refill on insulin within the next week  [facility] CM Short Term Goal #2 Start Date  09/23/19  [facility] CM Short Term Goal #2 Met Date  10/22/19     [name], RN, MSN [place] Manager [phone]

## 2019-10-23 ENCOUNTER — Telehealth (HOSPITAL_COMMUNITY): Payer: Self-pay

## 2019-10-23 NOTE — Telephone Encounter (Signed)
Pt left me a message that he found his other meds that I told him were missing and he placed them in pill box.   Marylouise Stacks, EMT-Paramedic  10/23/19

## 2019-10-24 ENCOUNTER — Other Ambulatory Visit (HOSPITAL_COMMUNITY): Payer: Medicare Other

## 2019-10-24 ENCOUNTER — Other Ambulatory Visit: Payer: Self-pay

## 2019-10-25 ENCOUNTER — Ambulatory Visit (HOSPITAL_COMMUNITY)
Admission: RE | Admit: 2019-10-25 | Discharge: 2019-10-25 | Disposition: A | Discharge status: Home / Self Care | Payer: Medicare Other | Source: Ambulatory Visit | Attending: Internal Medicine | Admitting: Internal Medicine

## 2019-10-25 DIAGNOSIS — I5022 Chronic systolic (congestive) heart failure: Secondary | ICD-10-CM | POA: Diagnosis not present

## 2019-10-25 LAB — BASIC METABOLIC PANEL
Anion gap: 9 (ref 5–15)
BUN: 21 mg/dL — ABNORMAL HIGH (ref 6–20)
CO2: 27 mmol/L (ref 22–32)
Calcium: 9.1 mg/dL (ref 8.9–10.3)
Chloride: 101 mmol/L (ref 98–111)
Creatinine, Ser: 1.24 mg/dL (ref 0.61–1.24)
GFR calc Af Amer: >60 mL/min (ref >60)
GFR calc non Af Amer: >60 mL/min (ref >60)
Glucose, Bld: 247 mg/dL — ABNORMAL HIGH (ref 70–99)
Potassium: 4.5 mmol/L (ref 3.5–5.1)
Sodium: 137 mmol/L (ref 135–145)

## 2019-10-27 LAB — GLUCOSE, CAPILLARY: Glucose-Capillary: 17 mg/dL — CL (ref 70–99)

## 2019-10-28 ENCOUNTER — Encounter: Payer: Self-pay | Admitting: Endocrinology

## 2019-10-28 ENCOUNTER — Ambulatory Visit (INDEPENDENT_AMBULATORY_CARE_PROVIDER_SITE_OTHER): Payer: Medicare Other | Admitting: Endocrinology

## 2019-10-28 ENCOUNTER — Other Ambulatory Visit: Payer: Self-pay

## 2019-10-28 ENCOUNTER — Other Ambulatory Visit (HOSPITAL_COMMUNITY): Payer: Self-pay

## 2019-10-28 ENCOUNTER — Ambulatory Visit: Payer: Medicare Other | Admitting: Endocrinology

## 2019-10-28 VITALS — BP 124/72 | HR 90 | Ht 64.0 in | Wt 160.0 lb

## 2019-10-28 DIAGNOSIS — Z794 Long term (current) use of insulin: Secondary | ICD-10-CM | POA: Diagnosis not present

## 2019-10-28 DIAGNOSIS — E1140 Type 2 diabetes mellitus with diabetic neuropathy, unspecified: Secondary | ICD-10-CM | POA: Diagnosis not present

## 2019-10-28 MED ORDER — TRESIBA FLEXTOUCH 200 UNIT/ML ~~LOC~~ SOPN: 14.0000 [IU] | PEN_INJECTOR | Freq: Every day | SUBCUTANEOUS | Status: DC

## 2019-10-28 NOTE — Progress Notes (Signed)
Paramedicine Encounter    Patient ID: Calvin [name], male    DOB: [birth year], 60 y.o.   MRN: [id number]   [name], NP as PCP - General (Geriatric Medicine) [name], MD as PCP - Cardiology (Cardiology) [name], [name], MD as PCP - Advanced Heart Failure (Cardiology) [name], MD as Consulting Physician (Endocrinology) Rudean Haskell, [facility] as [place] Management (Pharmacist) [name], RN as [place] Management  Patient Active Problem List   Diagnosis Date Noted  . Hypoglycemia due to insulin 10/15/2019  . Chronic anticoagulation 10/15/2019  . COVID-19 virus detected 06/18/2019  . Right sided weakness 06/17/2019  . AKI (acute kidney injury) ([place]) 06/17/2019  . Foot pain, bilateral 02/01/2019  . Abdominal pain 01/23/2019  . Chest pain 01/23/2019  . Right leg pain 12/20/2018  . Overgrown toenails 12/20/2018  . Unsteady gait 12/20/2018  . Encounter for therapeutic drug monitoring 10/22/2018  . Nausea and vomiting 03/10/2018  . AF (paroxysmal atrial fibrillation) ([place]) 03/10/2018  . Non-cardiac chest pain 03/09/2018  . Diabetic neuropathy ([place]) 09/26/2017  . MCI (mild cognitive impairment) 06/23/2017  . OSA on CPAP 05/10/2017  . Gastroesophageal reflux disease 05/10/2017  . Insomnia 05/10/2017  . Onychomycosis of multiple toenails with type 2 diabetes mellitus and peripheral neuropathy ([place]) 05/10/2017  . Chronic systolic heart failure ([place]) 11/22/2015  . Essential hypertension 11/22/2015  . DM (diabetes mellitus) ([place]) 11/20/2015  . HLD (hyperlipidemia) 11/20/2015  . Hx of adenomatous colonic polyps 05/19/2011    Current Outpatient Medications:  .  albuterol (VENTOLIN HFA) 108 (90 Base) MCG/ACT inhaler, Inhale 2 puffs into the lungs every 6 (six) hours as needed for wheezing or shortness of breath., Disp: 6.7 g, Rfl: 0 .  amitriptyline (ELAVIL) 10 MG tablet, Take 2 tablets  at bed time (Patient taking differently: Take 20 mg by mouth at bedtime. ), Disp: 180 tablet, Rfl: 3 .  apixaban (ELIQUIS) 5 MG TABS tablet, Take 5 mg by mouth 2 (two) times daily., Disp: , Rfl:  .  carvedilol (COREG) 6.25 MG tablet, Take 1 tablet (6.25 mg total) by mouth 2 (two) times daily with a meal., Disp: 180 tablet, Rfl: 3 .  digoxin (LANOXIN) 0.125 MG tablet, Take 1 tablet (0.125 mg total) by mouth daily., Disp: 90 tablet, Rfl: 3 .  donepezil (ARICEPT) 10 MG tablet, Take 1 tablet (10 mg total) by mouth at bedtime., Disp: 90 tablet, Rfl: 3 .  Evolocumab (REPATHA SURECLICK) 409 MG/ML SOAJ, Inject 1 pen into the skin every 14 (fourteen) days., Disp: 6 pen, Rfl: 3 .  ezetimibe (ZETIA) 10 MG tablet, Take 1 tablet (10 mg total) by mouth daily., Disp: 90 tablet, Rfl: 3 .  glucose blood (ONETOUCH ULTRA) test strip, 1 each by Other route 2 (two) times daily. E11.9, Disp: 180 each, Rfl: 0 .  insulin degludec (TRESIBA FLEXTOUCH) 200 UNIT/ML FlexTouch Pen, Inject 10 Units into the skin daily. If blood sugar rises consistently above 200 mg/dl, inject 12 units once daily, Disp: , Rfl:  .  Insulin Syringe-Needle U-100 (INSULIN SYRINGE .3CC/29GX1/2") 29G X 1/2" 0.3 ML MISC, Inject 1 Syringe 3 (three) times daily as directed. Check blood sugar three times daily. Dx: E11.9 (Patient taking differently: Inject 1 Syringe as directed See admin instructions. Check blood sugar three times daily. Dx: E11.9 ), Disp: 100 each, Rfl: 3 .  Lancets (ONETOUCH DELICA PLUS [id number]) MISC, 1 each by Does not apply route 2 (two) times daily.  E11.9, Disp: 180 each, Rfl: 0 .  lisinopril (ZESTRIL) 10 MG tablet, Take 1 tablet (10 mg total) by mouth at bedtime., Disp: 90 tablet, Rfl: 3 .  mirtazapine (REMERON) 7.5 MG tablet, Take 1 tablet (7.5 mg total) by mouth at bedtime., Disp: 30 tablet, Rfl: 0 .  polyethylene glycol (MIRALAX / GLYCOLAX) 17 g packet, Take 17 g by mouth 2 (two) times daily., Disp: 14 each, Rfl: 0 .  senna  (SENOKOT) 8.6 MG TABS tablet, Take 1 tablet (8.6 mg total) by mouth daily as needed for mild constipation., Disp: 15 tablet, Rfl: 0 .  spironolactone (ALDACTONE) 25 MG tablet, Take 1 tablet (25 mg total) by mouth daily., Disp: 90 tablet, Rfl: 3 .  nitroGLYCERIN (NITROSTAT) 0.4 MG SL tablet, Place 1 tablet (0.4 mg total) under the tongue every 5 (five) minutes as needed for chest pain. (Patient not taking: Reported on 10/22/2019), Disp: 30 tablet, Rfl: 1 No Known Allergies    Social History   Socioeconomic History  . Marital status: Single    Spouse name: Not on file  . Number of children: 0  . Years of education: Not on file  . Highest education level: Not on file  Occupational History  . Occupation: Disability  Tobacco Use  . Smoking status: Never Smoker  . Smokeless tobacco: Never Used  Substance and Sexual Activity  . Alcohol use: No  . Drug use: No  . Sexual activity: Yes    Birth control/protection: None  Other Topics Concern  . Not on file  Social History Narrative   Social History   Diet?    Do you drink/eat things with caffeine? yes   Marital status?       single      Do you live in a house, apartment, assisted living, condo, trailer, etc.? yes   Is it one or more stories? One story   How many persons live in your home?   Do you have any pets in your home? (please list)    Highest level of education completed? graduate   Do you exercise?            no                          Type & how often?   Advanced Directives   Do you have a living will? no   Do you have a DNR form?                                  If not, do you want to discuss one? no   Do you have signed POA/HPOA for forms? no      Functional Status   Do you have difficulty bathing or dressing yourself? no   Do you have difficulty preparing food or eating? no   Do you have difficulty managing your medications? no   Do you have difficulty managing your finances? no   Do you have difficulty affording your  medications? no   Social Determinants of Health   Financial Resource Strain:   . Difficulty of Paying Living Expenses:   Food Insecurity: No Food Insecurity  . Worried About Charity fundraiser in the Last Year: Never true  . Ran Out of Food in the Last Year: Never true  Transportation Needs: Unmet Transportation Needs  . Lack of Transportation (Medical): Yes  .  Lack of Transportation (Non-Medical): Yes  Physical Activity:   . Days of Exercise per Week:   . Minutes of Exercise per Session:   Stress:   . Feeling of Stress :   Social Connections:   . Frequency of Communication with Friends and Family:   . Frequency of Social Gatherings with Friends and Family:   . Attends Religious Services:   . Active Member of Clubs or Organizations:   . Attends Archivist Meetings:   [name] Marital Status:   Intimate Partner Violence:   . Fear of Current or Ex-Partner:   . Emotionally Abused:   [name] Physically Abused:   . Sexually Abused:     Physical Exam      Future Appointments  Date Time Provider [place]  10/28/2019  3:00 PM [name], MD [facility] None  10/29/2019  9:30 AM [name], RN [facility] None  11/01/2019  3:45 PM Felipa Furnace, DPM [facility]  11/04/2019  8:15 AM [place] [facility]  11/05/2019  3:45 PM Sum[place], Octavio Graves, RPH [facility] None  11/18/2019  3:15 PM [name], NP [facility] None  12/04/2019  9:45 AM [name], MD [facility] None  01/20/2020  9:30 AM [name], NP [facility] None  03/18/2020 11:30 AM Cameron Sprang, MD [facility] None   h BP 124/78   Pulse 86   Temp (!) 97.2 F (36.2 C)   Resp 18   Wt 160 lb (72.6 kg)   SpO2 99%   BMI 27.46 kg/m  CBG PTA-277 Weight yesterday- Last visit weight-158  Pt states he hs been feeling better, no more CBG lows.  Insulin was adjusted. He was told do not take any if CBG is less than 200. He is staying at his god-daughters house now but met me at the  nieces house where he was staying at before. He states his god-daughters house is quieter and more peaceful, less arguing there and less stressful.  Pt denies increased sob, he states his breathing is getter better since he had covid so things are improving.  He denies dizziness, no c/p.  Due to his inconsistency with meeting me the past few wks I filled 2 pill boxes for him.  -meds verified and 2 pill boxes refilled.   -eliquis needed before next visit-he will call to reorder   Marylouise Stacks, [place] Paramedic  10/28/19

## 2019-10-28 NOTE — Progress Notes (Signed)
Subjective:    Patient ID: Calvin [name], male    DOB: [birth year], 60 y.o.   MRN: [id number]  HPI Pt returns for f/u of diabetes mellitus: DM type: Insulin-requiring type 2. Dx'ed: 3212 Complications: polyneuropathy, CAD, CRI, and DR.   Therapy: insulin since soon after dx.  DKA: never Severe hypoglycemia: once, in 2021. Pancreatitis: never Pancreatic imaging: normal on 2003 CT. SDOH: due to noncompliance, he is not a candidate for multiple daily injections; he is retired; he gets insulin from pt assistance; changes between brands must be minimized, due to LHL.  Interval history: no cbg record, but states cbg's vary from 170-300.  He says he takes insulin as rx'ed.   Past Medical History:  Diagnosis Date  . Adenoidal hypertrophy   . Adenomatous colon polyps 2012  . Arthritis   . Diabetes mellitus   . GERD (gastroesophageal reflux disease)   . Heart attack ([place])    While living in [place]  [name] Hx of adenomatous colonic polyps 08/18/2017  . Hyperlipidemia   . Hypertension   . Mild CAD    a. cath in 08/2017 showing mild nonobstructive CAD with scattered 20-30% stenosis.   . Nonischemic cardiomyopathy ([place])    a. EF 20-25% by echo in 09/2017 with cath showing mild CAD. b.  Last echo 12/2017 EF 35-40%, grade 2 DD.  [name] Obesity   . PAF (paroxysmal atrial fibrillation) ([place])   . Sleep apnea    cpap, pt says no longer has    Past Surgical History:  Procedure Laterality Date  . COLONOSCOPY  05/13/11   9 adenomas  . FOREARM SURGERY    . MUSCLE BIOPSY    . RIGHT/LEFT HEART CATH AND CORONARY ANGIOGRAPHY N/A 08/25/2017   Procedure: RIGHT/LEFT HEART CATH AND CORONARY ANGIOGRAPHY;  Surgeon: [name], MD;  Location: [facility];  Service: Cardiovascular;  Laterality: N/A;  . RIGHT/LEFT HEART CATH AND CORONARY ANGIOGRAPHY N/A 10/01/2019   Procedure: RIGHT/LEFT HEART CATH AND CORONARY ANGIOGRAPHY;  Surgeon: [name], MD;  Location: [facility];   Service: Cardiovascular;  Laterality: N/A;    Social History   Socioeconomic History  . Marital status: Single    Spouse name: Not on file  . Number of children: 0  . Years of education: Not on file  . Highest education level: Not on file  Occupational History  . Occupation: Disability  Tobacco Use  . Smoking status: Never Smoker  . Smokeless tobacco: Never Used  Substance and Sexual Activity  . Alcohol use: No  . Drug use: No  . Sexual activity: Yes    Birth control/protection: None  Other Topics Concern  . Not on file  Social History Narrative   Social History   Diet?    Do you drink/eat things with caffeine? yes   Marital status?       single      Do you live in a house, apartment, assisted living, condo, trailer, etc.? yes   Is it one or more stories? One story   How many persons live in your home?   Do you have any pets in your home? (please list)    Highest level of education completed? graduate   Do you exercise?            no                          Type & how often?  Advanced Directives   Do you have a living will? no   Do you have a DNR form?                                  If not, do you want to discuss one? no   Do you have signed POA/HPOA for forms? no      Functional Status   Do you have difficulty bathing or dressing yourself? no   Do you have difficulty preparing food or eating? no   Do you have difficulty managing your medications? no   Do you have difficulty managing your finances? no   Do you have difficulty affording your medications? no   Social Determinants of Health   Financial Resource Strain:   . Difficulty of Paying Living Expenses:   Food Insecurity: No Food Insecurity  . Worried About Charity fundraiser in the Last Year: Never true  . Ran Out of Food in the Last Year: Never true  Transportation Needs: Unmet Transportation Needs  . Lack of Transportation (Medical): Yes  . Lack of Transportation (Non-Medical): Yes  Physical  Activity:   . Days of Exercise per Week:   . Minutes of Exercise per Session:   Stress:   . Feeling of Stress :   Social Connections:   . Frequency of Communication with Friends and Family:   . Frequency of Social Gatherings with Friends and Family:   . Attends Religious Services:   . Active Member of Clubs or Organizations:   . Attends Archivist Meetings:   [name] Marital Status:   Intimate Partner Violence:   . Fear of Current or Ex-Partner:   . Emotionally Abused:   [name] Physically Abused:   . Sexually Abused:     Current Outpatient Medications on File Prior to Visit  Medication Sig Dispense Refill  . albuterol (VENTOLIN HFA) 108 (90 Base) MCG/ACT inhaler Inhale 2 puffs into the lungs every 6 (six) hours as needed for wheezing or shortness of breath. 6.7 g 0  . amitriptyline (ELAVIL) 10 MG tablet Take 2 tablets at bed time (Patient taking differently: Take 20 mg by mouth at bedtime. ) 180 tablet 3  . apixaban (ELIQUIS) 5 MG TABS tablet Take 5 mg by mouth 2 (two) times daily.    . carvedilol (COREG) 6.25 MG tablet Take 1 tablet (6.25 mg total) by mouth 2 (two) times daily with a meal. 180 tablet 3  . digoxin (LANOXIN) 0.125 MG tablet Take 1 tablet (0.125 mg total) by mouth daily. 90 tablet 3  . donepezil (ARICEPT) 10 MG tablet Take 1 tablet (10 mg total) by mouth at bedtime. 90 tablet 3  . Evolocumab (REPATHA SURECLICK) 300 MG/ML SOAJ Inject 1 pen into the skin every 14 (fourteen) days. 6 pen 3  . ezetimibe (ZETIA) 10 MG tablet Take 1 tablet (10 mg total) by mouth daily. 90 tablet 3  . Insulin Syringe-Needle U-100 (INSULIN SYRINGE .3CC/29GX1/2") 29G X 1/2" 0.3 ML MISC Inject 1 Syringe 3 (three) times daily as directed. Check blood sugar three times daily. Dx: E11.9 (Patient taking differently: Inject 1 Syringe as directed See admin instructions. Check blood sugar three times daily. Dx: E11.9 ) 100 each 3  . lisinopril (ZESTRIL) 10 MG tablet Take 1 tablet (10 mg total) by mouth at  bedtime. 90 tablet 3  . mirtazapine (REMERON) 7.5 MG tablet Take 1 tablet (7.5 mg total) by  mouth at bedtime. 30 tablet 0  . nitroGLYCERIN (NITROSTAT) 0.4 MG SL tablet Place 1 tablet (0.4 mg total) under the tongue every 5 (five) minutes as needed for chest pain. 30 tablet 1  . polyethylene glycol (MIRALAX / GLYCOLAX) 17 g packet Take 17 g by mouth 2 (two) times daily. 14 each 0  . senna (SENOKOT) 8.6 MG TABS tablet Take 1 tablet (8.6 mg total) by mouth daily as needed for mild constipation. 15 tablet 0  . spironolactone (ALDACTONE) 25 MG tablet Take 1 tablet (25 mg total) by mouth daily. 90 tablet 3   No current facility-administered medications on file prior to visit.    No Known Allergies  Family History  Problem Relation Age of Onset  . Heart disease Mother   . Heart attack Mother 70  . Hypertension Mother   . Hyperlipidemia Mother   . Diabetes Mother   . Heart disease Father   . Heart attack Father 56  . Hypertension Father   . Hyperlipidemia Father   . Diabetes Father   . Heart attack Sister 42  . Colon cancer Neg Hx   . Stomach cancer Neg Hx   . Esophageal cancer Neg Hx   . Rectal cancer Neg Hx     BP 124/72 (BP Location: Left Arm, Patient Position: Sitting, Cuff Size: Normal)   Pulse 90   Ht 5' 4"  (1.626 m)   Wt 160 lb (72.6 kg)   SpO2 98%   BMI 27.46 kg/m    Review of Systems He denies hypoglycemia.      Objective:   Physical Exam VITAL SIGNS:  See vs page GENERAL: no distress Pulses: dorsalis pedis intact bilat.   MSK: no deformity of the feet CV: no leg edema Skin:  no ulcer on the feet.  normal color and temp on the feet. Neuro: sensation is intact to touch on the feet.    Lab Results  Component Value Date   HGBA1C 9.2 (A) 10/09/2019       Assessment & Plan:  Insulin-requiring type 2 DM, with DR Noncompliance with cbg recording: this is apparently improved. His insulin need is still unknown.   SDOH: we'll minimize changes if insulin types   Patient Instructions  Please increase the Tresiba to 14 units daily.  On this type of insulin, you should eat meals on a regular schedule.  If a meal is missed or significantly delayed, your blood sugar could go low.   check your blood sugar twice a day.  vary the time of day when you check, between before the 3 meals, and at bedtime.  also check if you have symptoms of your blood sugar being too high or too low.  please keep a record of the readings and bring it to your next appointment here (or you can bring the meter itself).  You can write it on any piece of paper.  please call us sooner if your blood sugar goes below 70, or if you have a lot of readings over 200. Please come back for a follow-up appointment in 2-3 weeks.

## 2019-10-28 NOTE — Patient Instructions (Addendum)
Please increase the Tresiba to 14 units daily.  On this type of insulin, you should eat meals on a regular schedule.  If a meal is missed or significantly delayed, your blood sugar could go low.   check your blood sugar twice a day.  vary the time of day when you check, between before the 3 meals, and at bedtime.  also check if you have symptoms of your blood sugar being too high or too low.  please keep a record of the readings and bring it to your next appointment here (or you can bring the meter itself).  You can write it on any piece of paper.  please call us sooner if your blood sugar goes below 70, or if you have a lot of readings over 200. Please come back for a follow-up appointment in 2-3 weeks.

## 2019-10-29 ENCOUNTER — Other Ambulatory Visit: Payer: Self-pay | Admitting: *Deleted

## 2019-10-29 NOTE — Patient Outreach (Signed)
[facility]) Care Management  10/29/2019  Calvin [name] January 07, 1961 [id number]   Call placed to member to follow up diabetes management.  He denies any hypoglycemic episodes, report blood sugars are remaining stable in the 200s.  He was seen by endocrinologist on 4/26, insulin was increased to 14 units, up from 10 units after last visit.  He will have follow up on 5/17, will have follow up with PCP the same day.  State he has not received the prepared meals yet but report the will be delivered within the next week.  Denies any urgent concerns, will follow up within the next month.  [facility] CM Care Plan Problem One     Most Recent Value  Care Plan Problem One  Knowledge deficit related to disease process and mgmt of Diabetes  Role Documenting the Problem One  Care Management Telephonic Coordinator  Care Plan for Problem One  Active  [facility] Long Term Goal   Patient will report a lowering/decrease in A1C level of 9 over the next 90 days.  [facility] Long Term Goal Start Date  10/22/19  [facility] CM Short Term Goal #1   Member will report taking all medications as instructed over the next 4 weeks, especially insulin  [facility] CM Short Term Goal #1 Start Date  10/22/19  Interventions for Short Term Goal #1  Reviewed medication changes with member for understanding  [facility] CM Short Term Goal #2   Member will keep and attend follow up with endocrinologist within the next 4 weeks  [facility] CM Short Term Goal #2 Start Date  10/29/19  Interventions for Short Term Goal #2  AVS for most recent visit reviewed with member, including medication changes.  Upcoming appointment reviewed with member, advised of importance of keeping to manage blood sugars     [name], Therapist, sports, MSN [place] [phone]

## 2019-10-30 ENCOUNTER — Other Ambulatory Visit: Payer: Self-pay | Admitting: Endocrinology

## 2019-10-30 DIAGNOSIS — E1140 Type 2 diabetes mellitus with diabetic neuropathy, unspecified: Secondary | ICD-10-CM

## 2019-11-01 ENCOUNTER — Encounter: Payer: Self-pay | Admitting: Podiatry

## 2019-11-01 ENCOUNTER — Ambulatory Visit: Payer: Medicare Other | Admitting: Podiatry

## 2019-11-01 ENCOUNTER — Telehealth: Payer: Self-pay | Admitting: *Deleted

## 2019-11-01 ENCOUNTER — Other Ambulatory Visit: Payer: Self-pay

## 2019-11-01 VITALS — Temp 97.2°F

## 2019-11-01 DIAGNOSIS — M2042 Other hammer toe(s) (acquired), left foot: Secondary | ICD-10-CM | POA: Diagnosis not present

## 2019-11-01 DIAGNOSIS — M2041 Other hammer toe(s) (acquired), right foot: Secondary | ICD-10-CM | POA: Diagnosis not present

## 2019-11-01 DIAGNOSIS — B351 Tinea unguium: Secondary | ICD-10-CM | POA: Diagnosis not present

## 2019-11-01 DIAGNOSIS — E1142 Type 2 diabetes mellitus with diabetic polyneuropathy: Secondary | ICD-10-CM

## 2019-11-01 DIAGNOSIS — M79676 Pain in unspecified toe(s): Secondary | ICD-10-CM

## 2019-11-01 NOTE — Telephone Encounter (Signed)
Male caller Calvin [name] showed on my office phone states she had dropped off her godfather for an appt and she wanted to know if he was done his phone is dead. I checked the office and clinicals and there was no pt in office and I informed caller.

## 2019-11-01 NOTE — Progress Notes (Signed)
  Subjective:  Patient ID: Calvin [name], male    DOB: 09/24/1960,  MRN: [id number]  Chief Complaint  Patient presents with  . Nail Problem    Thick, long toenails  . Diabetes    Most recent HgbA1c on file = 9.2.   59 y.o. male returns for the above complaint.  Patient presents with thickened elongated dystrophic toenails x10.  No pain on palpation.  He states it hurts when ambulating.  He is diabetic with last A1c of 9.3.  He is also on Eliquis.  He would like to have them debrided down.  He has secondary complaint of bilateral bunions and hammertoe contracture.  He would like to discuss conservative care for them.  Objective:   Vitals:   11/01/19 1543  Temp: (!) 97.2 F (36.2 C)   Podiatric Exam: Vascular: dorsalis pedis and posterior tibial pulses are palpable bilateral. Capillary return is immediate. Temperature gradient is WNL. Skin turgor WNL  Sensorium: Normal Semmes Weinstein monofilament test. Normal tactile sensation bilaterally. Nail Exam: Pt has thick disfigured discolored nails with subungual debris noted bilateral entire nail hallux through fifth toenails Ulcer Exam: There is no evidence of ulcer or pre-ulcerative changes or infection. Orthopedic Exam: Muscle tone and strength are WNL. No limitations in general ROM. No crepitus or effusions noted. HAV  B/L.  Hammer toes 2-5  B/L. Skin: No Porokeratosis. No infection or ulcers  Assessment & Plan:  Patient was evaluated and treated and all questions answered.  Onychomycosis with pain  -Nails palliatively debrided as below. -Educated on self-care  Bilateral bunion/hammertoe deformities -I explained to the patient the etiology of bunions and hammertoe to deformities and various treatment options were extensively discussed.  Given the patient is a diabetic with A1c of 9.1 with excessive pressure points to both of his feet I believe he will benefit from diabetic shoes.  He will be scheduled to see rec for diabetic  shoes.   Procedure: Nail Debridement Rationale: pain  Type of Debridement: manual, sharp debridement. Instrumentation: Nail nipper, rotary burr. Number of Nails: 10  Procedures and Treatment: Consent by patient was obtained for treatment procedures. The patient understood the discussion of treatment and procedures well. All questions were answered thoroughly reviewed. Debridement of mycotic and hypertrophic toenails, 1 through 5 bilateral and clearing of subungual debris. No ulceration, no infection noted.  Return Visit-Office Procedure: Patient instructed to return to the office for a follow up visit 3 months for continued evaluation and treatment.  Boneta Lucks, DPM    No follow-ups on file.

## 2019-11-04 ENCOUNTER — Ambulatory Visit: Payer: Medicare Other | Attending: Internal Medicine

## 2019-11-04 DIAGNOSIS — Z23 Encounter for immunization: Secondary | ICD-10-CM

## 2019-11-04 NOTE — Progress Notes (Signed)
   Covid-19 Vaccination Clinic  Name:  Darrick Greenlaw    MRN: [id number] DOB: 1960/12/13  11/04/2019  Mr. Traynham was observed post Covid-19 immunization for 15 minutes without incident. He was provided with Vaccine Information Sheet and instruction to access the V-Safe system.   Mr. Cott was instructed to call 911 with any severe reactions post vaccine: [name] Difficulty breathing  . Swelling of face and throat  . A fast heartbeat  . A bad rash all over body  . Dizziness and weakness   Immunizations Administered    Name Date Dose VIS Date Route   Pfizer COVID-19 Vaccine 11/04/2019  8:12 AM 0.3 mL 08/28/2018 Intramuscular   Manufacturer: [place]   Lot: [id number]   [place]: [id number]

## 2019-11-05 ENCOUNTER — Ambulatory Visit: Payer: Self-pay | Admitting: Pharmacist

## 2019-11-05 ENCOUNTER — Other Ambulatory Visit: Payer: Self-pay | Admitting: Pharmacist

## 2019-11-05 MED ORDER — APIXABAN 5 MG PO TABS: 5.0000 mg | ORAL_TABLET | Freq: Two times a day (BID) | ORAL | 5 refills | Status: DC

## 2019-11-05 NOTE — Patient Outreach (Signed)
[facility]) Care Management  11/05/2019  Eissa Buchberger Jul 27, 1960 [id number]  F/u on Extra Help application.   Call placed to [place] to find out if co-pays have changed to confirm if patient approved for Extra Help.  Pharmacist reports no refills available for Eliquis therefore cannot run test claim to verify co-pay.    Request sent to cardiology pharmacist regarding need for refills on Eliquis.  Refills sent in to pharmacy.   2nd call placed to [place].  Eliquis is $9.20 co-pay x 1 month supply.  Pharmacy able to update to fill x 3 month supply as co-pay remains $9.20.   Based on this co-pay, patient has been approved for FULL Extra Help.   Successful phone call to patient to provide update.  He is very appreciative of [facility] assistance.  He will pick up Eliquis prescription today.   Communication sent to Marylouise Stacks, EMT, with update regarding Extra Help.  She is planning on refilling medication pillbox in the next week and will ensure Eliquis included.    No further concerns from [facility] or patient at this time.  Will update [facility] RN and close pharmacy case at this time.  Am happy to assist further as needed.   Ralene Bathe, PharmD, [place] [phone]

## 2019-11-11 ENCOUNTER — Other Ambulatory Visit (HOSPITAL_COMMUNITY): Payer: Self-pay

## 2019-11-11 NOTE — Progress Notes (Signed)
Paramedicine Encounter    Patient ID: Calvin [name], male    DOB: [birth year], 60 y.o.   MRN: [id number]   [name], NP as PCP - General (Geriatric Medicine) [name], MD as PCP - Cardiology (Cardiology) [name], [name], MD as PCP - Advanced Heart Failure (Cardiology) [name], MD as Consulting Physician (Endocrinology) [name], RN as [facility] Management  Patient Active Problem List   Diagnosis Date Noted  . Hypoglycemia due to insulin 10/15/2019  . Chronic anticoagulation 10/15/2019  . COVID-19 virus detected 06/18/2019  . Right sided weakness 06/17/2019  . AKI (acute kidney injury) ([place]) 06/17/2019  . Foot pain, bilateral 02/01/2019  . Abdominal pain 01/23/2019  . Chest pain 01/23/2019  . Right leg pain 12/20/2018  . Overgrown toenails 12/20/2018  . Unsteady gait 12/20/2018  . Encounter for therapeutic drug monitoring 10/22/2018  . Nausea and vomiting 03/10/2018  . AF (paroxysmal atrial fibrillation) ([place]) 03/10/2018  . Non-cardiac chest pain 03/09/2018  . Diabetic neuropathy ([place]) 09/26/2017  . MCI (mild cognitive impairment) 06/23/2017  . OSA on CPAP 05/10/2017  . Gastroesophageal reflux disease 05/10/2017  . Insomnia 05/10/2017  . Onychomycosis of multiple toenails with type 2 diabetes mellitus and peripheral neuropathy ([place]) 05/10/2017  . Chronic systolic heart failure ([place]) 11/22/2015  . Essential hypertension 11/22/2015  . DM (diabetes mellitus) ([place]) 11/20/2015  . HLD (hyperlipidemia) 11/20/2015  . Hx of adenomatous colonic polyps 05/19/2011    Current Outpatient Medications:  .  albuterol (VENTOLIN HFA) 108 (90 Base) MCG/ACT inhaler, Inhale 2 puffs into the lungs every 6 (six) hours as needed for wheezing or shortness of breath., Disp: 6.7 g, Rfl: 0 .  amitriptyline (ELAVIL) 10 MG tablet, Take 2 tablets at bed time (Patient taking differently: Take 20 mg by mouth at bedtime. ),  Disp: 180 tablet, Rfl: 3 .  apixaban (ELIQUIS) 5 MG TABS tablet, Take 1 tablet (5 mg total) by mouth 2 (two) times daily., Disp: 60 tablet, Rfl: 5 .  carvedilol (COREG) 6.25 MG tablet, Take 1 tablet (6.25 mg total) by mouth 2 (two) times daily with a meal., Disp: 180 tablet, Rfl: 3 .  digoxin (LANOXIN) 0.125 MG tablet, Take 1 tablet (0.125 mg total) by mouth daily., Disp: 90 tablet, Rfl: 3 .  donepezil (ARICEPT) 10 MG tablet, Take 1 tablet (10 mg total) by mouth at bedtime., Disp: 90 tablet, Rfl: 3 .  Evolocumab (REPATHA SURECLICK) 254 MG/ML SOAJ, Inject 1 pen into the skin every 14 (fourteen) days., Disp: 6 pen, Rfl: 3 .  ezetimibe (ZETIA) 10 MG tablet, Take 1 tablet (10 mg total) by mouth daily., Disp: 90 tablet, Rfl: 3 .  insulin degludec (TRESIBA FLEXTOUCH) 200 UNIT/ML FlexTouch Pen, Inject 14 Units into the skin daily., Disp: , Rfl:  .  Insulin Syringe-Needle U-100 (INSULIN SYRINGE .3CC/29GX1/2") 29G X 1/2" 0.3 ML MISC, Inject 1 Syringe 3 (three) times daily as directed. Check blood sugar three times daily. Dx: E11.9 (Patient taking differently: Inject 1 Syringe as directed See admin instructions. Check blood sugar three times daily. Dx: E11.9 ), Disp: 100 each, Rfl: 3 .  Lancets (ONETOUCH DELICA PLUS [id number]) MISC, USE TWICE DAILY, Disp: 200 each, Rfl: 3 .  lisinopril (ZESTRIL) 10 MG tablet, Take 1 tablet (10 mg total) by mouth at bedtime., Disp: 90 tablet, Rfl: 3 .  mirtazapine (REMERON) 7.5 MG tablet, Take 1 tablet (7.5 mg total) by mouth at bedtime., Disp: 30 tablet, Rfl: 0 .  ONETOUCH ULTRA test strip, USE TWICE DAILY, Disp: 200 strip, Rfl: 3 .  polyethylene glycol (MIRALAX / GLYCOLAX) 17 g packet, Take 17 g by mouth 2 (two) times daily., Disp: 14 each, Rfl: 0 .  senna (SENOKOT) 8.6 MG TABS tablet, Take 1 tablet (8.6 mg total) by mouth daily as needed for mild constipation., Disp: 15 tablet, Rfl: 0 .  spironolactone (ALDACTONE) 25 MG tablet, Take 1 tablet (25 mg total) by mouth daily.,  Disp: 90 tablet, Rfl: 3 .  nitroGLYCERIN (NITROSTAT) 0.4 MG SL tablet, Place 1 tablet (0.4 mg total) under the tongue every 5 (five) minutes as needed for chest pain. (Patient not taking: Reported on 11/11/2019), Disp: 30 tablet, Rfl: 1 No Known Allergies    Social History   Socioeconomic History  . Marital status: Single    Spouse name: Not on file  . Number of children: 0  . Years of education: Not on file  . Highest education level: Not on file  Occupational History  . Occupation: Disability  Tobacco Use  . Smoking status: Never Smoker  . Smokeless tobacco: Never Used  Substance and Sexual Activity  . Alcohol use: No  . Drug use: No  . Sexual activity: Yes    Birth control/protection: None  Other Topics Concern  . Not on file  Social History Narrative   Social History   Diet?    Do you drink/eat things with caffeine? yes   Marital status?       single      Do you live in a house, apartment, assisted living, condo, trailer, etc.? yes   Is it one or more stories? One story   How many persons live in your home?   Do you have any pets in your home? (please list)    Highest level of education completed? graduate   Do you exercise?            no                          Type & how often?   Advanced Directives   Do you have a living will? no   Do you have a DNR form?                                  If not, do you want to discuss one? no   Do you have signed POA/HPOA for forms? no      Functional Status   Do you have difficulty bathing or dressing yourself? no   Do you have difficulty preparing food or eating? no   Do you have difficulty managing your medications? no   Do you have difficulty managing your finances? no   Do you have difficulty affording your medications? no   Social Determinants of Health   Financial Resource Strain:   . Difficulty of Paying Living Expenses:   Food Insecurity: No Food Insecurity  . Worried About Charity fundraiser in the Last Year:  Never true  . Ran Out of Food in the Last Year: Never true  Transportation Needs: Unmet Transportation Needs  . Lack of Transportation (Medical): Yes  . Lack of Transportation (Non-Medical): Yes  Physical Activity:   . Days of Exercise per Week:   . Minutes of Exercise per Session:   Stress:   . Feeling of Stress :   Social Connections:   .  Frequency of Communication with Friends and Family:   . Frequency of Social Gatherings with Friends and Family:   . Attends Religious Services:   . Active Member of Clubs or Organizations:   . Attends Archivist Meetings:   [name] Marital Status:   Intimate Partner Violence:   . Fear of Current or Ex-Partner:   . Emotionally Abused:   [name] Physically Abused:   . Sexually Abused:     Physical Exam      Future Appointments  Date Time Provider [place]  11/14/2019 10:45 AM Velora Heckler [facility]  11/18/2019  2:15 PM [name], MD [facility] None  11/18/2019  3:15 PM [name], NP [facility] None  11/28/2019  9:30 AM [name], RN [facility] None  12/13/2019  4:15 PM Felipa Furnace, DPM [facility]  01/20/2020  9:30 AM [name], NP [facility] None  03/18/2020 11:30 AM Cameron Sprang, MD [facility] None    BP 110/70   Pulse 82   Temp 98.2 F (36.8 C)   Resp 16   Wt 161 lb (73 kg)   SpO2 99%   BMI 27.64 kg/m    CBG EMS-343 Weight yesterday-161 Last visit weight-160  Pt reports feeling well, he denies increased sob, no dizziness, no c/p.  He has been having trouble with his glucometer but I went over it with him and showed him again how to work it.  He reports using his insulin as directed.  No missed doses of his meds. meds verified and pill box refilled.  Will see him again in 2 wks.  He got approved for the full extra help so his eliquis was $9 for the 90 day supply.   Marylouise Stacks, [facility] Paramedic  11/11/19

## 2019-11-14 ENCOUNTER — Other Ambulatory Visit: Payer: Self-pay

## 2019-11-14 ENCOUNTER — Ambulatory Visit: Payer: Medicare Other | Admitting: Orthotics

## 2019-11-14 DIAGNOSIS — L84 Corns and callosities: Secondary | ICD-10-CM

## 2019-11-14 DIAGNOSIS — E1142 Type 2 diabetes mellitus with diabetic polyneuropathy: Secondary | ICD-10-CM

## 2019-11-14 DIAGNOSIS — M2042 Other hammer toe(s) (acquired), left foot: Secondary | ICD-10-CM

## 2019-11-14 DIAGNOSIS — M2041 Other hammer toe(s) (acquired), right foot: Secondary | ICD-10-CM

## 2019-11-14 DIAGNOSIS — M2010 Hallux valgus (acquired), unspecified foot: Secondary | ICD-10-CM

## 2019-11-14 NOTE — Progress Notes (Signed)

## 2019-11-18 ENCOUNTER — Ambulatory Visit (INDEPENDENT_AMBULATORY_CARE_PROVIDER_SITE_OTHER): Payer: Medicare Other | Admitting: Nurse Practitioner

## 2019-11-18 ENCOUNTER — Encounter: Payer: Self-pay | Admitting: Endocrinology

## 2019-11-18 ENCOUNTER — Ambulatory Visit (INDEPENDENT_AMBULATORY_CARE_PROVIDER_SITE_OTHER): Payer: Medicare Other | Admitting: Endocrinology

## 2019-11-18 ENCOUNTER — Encounter: Payer: Self-pay | Admitting: Nurse Practitioner

## 2019-11-18 ENCOUNTER — Other Ambulatory Visit: Payer: Self-pay

## 2019-11-18 VITALS — BP 122/78 | HR 82 | Temp 97.3°F | Ht 64.0 in | Wt 162.8 lb

## 2019-11-18 VITALS — BP 118/70 | HR 94 | Ht 64.0 in | Wt 163.0 lb

## 2019-11-18 DIAGNOSIS — R0602 Shortness of breath: Secondary | ICD-10-CM

## 2019-11-18 DIAGNOSIS — R630 Anorexia: Secondary | ICD-10-CM

## 2019-11-18 DIAGNOSIS — E1140 Type 2 diabetes mellitus with diabetic neuropathy, unspecified: Secondary | ICD-10-CM | POA: Diagnosis not present

## 2019-11-18 DIAGNOSIS — Z794 Long term (current) use of insulin: Secondary | ICD-10-CM | POA: Diagnosis not present

## 2019-11-18 MED ORDER — TRESIBA FLEXTOUCH 200 UNIT/ML ~~LOC~~ SOPN: 16.0000 [IU] | PEN_INJECTOR | Freq: Every day | SUBCUTANEOUS | Status: DC

## 2019-11-18 NOTE — Progress Notes (Signed)
Subjective:    Patient ID: Calvin [name], male    DOB: Nov 21, 1960, 59 y.o.   MRN: [id number]  HPI Pt returns for f/u of diabetes mellitus: DM type: Insulin-requiring type 2. Dx'ed: 6808 Complications: PN, CAD, CRI, and DR.   Therapy: insulin since soon after dx.  DKA: never Severe hypoglycemia: once, in 2021.   Pancreatitis: never Pancreatic imaging: normal on 2003 CT. SDOH: due to noncompliance, he is not a candidate for multiple daily injections; he is retired; he gets insulin from pt assistance; changes between brands must be minimized, due to LHL.  Other: up to early 2021, he was taking more than 200 units QD.   Interval history: no cbg record, but states cbg's vary from 180-248.  He says he takes insulin as rx'ed.   Past Medical History:  Diagnosis Date  . Adenoidal hypertrophy   . Adenomatous colon polyps 2012  . Arthritis   . Diabetes mellitus   . GERD (gastroesophageal reflux disease)   . Heart attack ([place])    While living in [place]  [name] Hx of adenomatous colonic polyps 08/18/2017  . Hyperlipidemia   . Hypertension   . Mild CAD    a. cath in 08/2017 showing mild nonobstructive CAD with scattered 20-30% stenosis.   . Nonischemic cardiomyopathy ([place])    a. EF 20-25% by echo in 09/2017 with cath showing mild CAD. b.  Last echo 12/2017 EF 35-40%, grade 2 DD.  [name] Obesity   . PAF (paroxysmal atrial fibrillation) ([place])   . Sleep apnea    cpap, pt says no longer has    Past Surgical History:  Procedure Laterality Date  . COLONOSCOPY  05/13/11   9 adenomas  . FOREARM SURGERY    . MUSCLE BIOPSY    . RIGHT/LEFT HEART CATH AND CORONARY ANGIOGRAPHY N/A 08/25/2017   Procedure: RIGHT/LEFT HEART CATH AND CORONARY ANGIOGRAPHY;  Surgeon: [name], MD;  Location: [facility];  Service: Cardiovascular;  Laterality: N/A;  . RIGHT/LEFT HEART CATH AND CORONARY ANGIOGRAPHY N/A 10/01/2019   Procedure: RIGHT/LEFT HEART CATH AND CORONARY ANGIOGRAPHY;  Surgeon:  [name], MD;  Location: [facility];  Service: Cardiovascular;  Laterality: N/A;    Social History   Socioeconomic History  . Marital status: Single    Spouse name: Not on file  . Number of children: 0  . Years of education: Not on file  . Highest education level: Not on file  Occupational History  . Occupation: Disability  Tobacco Use  . Smoking status: Never Smoker  . Smokeless tobacco: Never Used  Substance and Sexual Activity  . Alcohol use: No  . Drug use: No  . Sexual activity: Yes    Birth control/protection: None  Other Topics Concern  . Not on file  Social History Narrative   Social History   Diet?    Do you drink/eat things with caffeine? yes   Marital status?       single      Do you live in a house, apartment, assisted living, condo, trailer, etc.? yes   Is it one or more stories? One story   How many persons live in your home?   Do you have any pets in your home? (please list)    Highest level of education completed? graduate   Do you exercise?            no  Type & how often?   Advanced Directives   Do you have a living will? no   Do you have a DNR form?                                  If not, do you want to discuss one? no   Do you have signed POA/HPOA for forms? no      Functional Status   Do you have difficulty bathing or dressing yourself? no   Do you have difficulty preparing food or eating? no   Do you have difficulty managing your medications? no   Do you have difficulty managing your finances? no   Do you have difficulty affording your medications? no   Social Determinants of Health   Financial Resource Strain:   . Difficulty of Paying Living Expenses:   Food Insecurity: No Food Insecurity  . Worried About Charity fundraiser in the Last Year: Never true  . Ran Out of Food in the Last Year: Never true  Transportation Needs: Unmet Transportation Needs  . Lack of Transportation (Medical): Yes  .  Lack of Transportation (Non-Medical): Yes  Physical Activity:   . Days of Exercise per Week:   . Minutes of Exercise per Session:   Stress:   . Feeling of Stress :   Social Connections:   . Frequency of Communication with Friends and Family:   . Frequency of Social Gatherings with Friends and Family:   . Attends Religious Services:   . Active Member of Clubs or Organizations:   . Attends Archivist Meetings:   [name] Marital Status:   Intimate Partner Violence:   . Fear of Current or Ex-Partner:   . Emotionally Abused:   [name] Physically Abused:   . Sexually Abused:     Current Outpatient Medications on File Prior to Visit  Medication Sig Dispense Refill  . albuterol (VENTOLIN HFA) 108 (90 Base) MCG/ACT inhaler Inhale 2 puffs into the lungs every 6 (six) hours as needed for wheezing or shortness of breath. 6.7 g 0  . amitriptyline (ELAVIL) 10 MG tablet Take 2 tablets at bed time (Patient taking differently: Take 20 mg by mouth at bedtime. ) 180 tablet 3  . apixaban (ELIQUIS) 5 MG TABS tablet Take 1 tablet (5 mg total) by mouth 2 (two) times daily. 60 tablet 5  . carvedilol (COREG) 6.25 MG tablet Take 1 tablet (6.25 mg total) by mouth 2 (two) times daily with a meal. 180 tablet 3  . digoxin (LANOXIN) 0.125 MG tablet Take 1 tablet (0.125 mg total) by mouth daily. 90 tablet 3  . donepezil (ARICEPT) 10 MG tablet Take 1 tablet (10 mg total) by mouth at bedtime. 90 tablet 3  . Evolocumab (REPATHA SURECLICK) 950 MG/ML SOAJ Inject 1 pen into the skin every 14 (fourteen) days. 6 pen 3  . ezetimibe (ZETIA) 10 MG tablet Take 1 tablet (10 mg total) by mouth daily. 90 tablet 3  . Insulin Syringe-Needle U-100 (INSULIN SYRINGE .3CC/29GX1/2") 29G X 1/2" 0.3 ML MISC Inject 1 Syringe 3 (three) times daily as directed. Check blood sugar three times daily. Dx: E11.9 (Patient taking differently: Inject 1 Syringe as directed See admin instructions. Check blood sugar three times daily. Dx: E11.9 ) 100 each 3   . Lancets (ONETOUCH DELICA PLUS [id number]) MISC USE TWICE DAILY 200 each 3  . lisinopril (ZESTRIL) 10 MG tablet Take 1 tablet (  10 mg total) by mouth at bedtime. 90 tablet 3  . nitroGLYCERIN (NITROSTAT) 0.4 MG SL tablet Place 1 tablet (0.4 mg total) under the tongue every 5 (five) minutes as needed for chest pain. 30 tablet 1  . ONETOUCH ULTRA test strip USE TWICE DAILY 200 strip 3  . polyethylene glycol (MIRALAX / GLYCOLAX) 17 g packet Take 17 g by mouth 2 (two) times daily. 14 each 0  . senna (SENOKOT) 8.6 MG TABS tablet Take 1 tablet (8.6 mg total) by mouth daily as needed for mild constipation. 15 tablet 0  . spironolactone (ALDACTONE) 25 MG tablet Take 1 tablet (25 mg total) by mouth daily. 90 tablet 3   No current facility-administered medications on file prior to visit.    No Known Allergies  Family History  Problem Relation Age of Onset  . Heart disease Mother   . Heart attack Mother 61  . Hypertension Mother   . Hyperlipidemia Mother   . Diabetes Mother   . Heart disease Father   . Heart attack Father 69  . Hypertension Father   . Hyperlipidemia Father   . Diabetes Father   . Heart attack Sister 44  . Colon cancer Neg Hx   . Stomach cancer Neg Hx   . Esophageal cancer Neg Hx   . Rectal cancer Neg Hx     BP 118/70   Pulse 94   Ht 5' 4"  (1.626 m)   Wt 163 lb (73.9 kg)   SpO2 98%   BMI 27.98 kg/m    Review of Systems He denies hypoglycemia    Objective:   Physical Exam VITAL SIGNS:  See vs page GENERAL: no distress Pulses: dorsalis pedis intact bilat.   MSK: no deformity of the feet CV: no leg edema Skin:  no ulcer on the feet.  normal color and temp on the feet. Neuro: sensation is intact to touch on the feet      Assessment & Plan:  Insulin-requiring type 2 DM, with CAD: He needs increased rx.  He declines to increase the insulin to more than 16 units QD.   Patient Instructions  Please increase the Tresiba to 16 units daily.  On this type of  insulin, you should eat meals on a regular schedule.  If a meal is missed or significantly delayed, your blood sugar could go low.   check your blood sugar twice a day.  vary the time of day when you check, between before the 3 meals, and at bedtime.  also check if you have symptoms of your blood sugar being too high or too low.  please keep a record of the readings and bring it to your next appointment here (or you can bring the meter itself).  You can write it on any piece of paper.  please call us sooner if your blood sugar goes below 70, or if you have a lot of readings over 200. Please come back for a follow-up appointment in 5-6 weeks.

## 2019-11-18 NOTE — Patient Instructions (Signed)
Take Remeron  1/2 tablet nightly for 3 nights then 1/2 tablets every other night for 1 week then stop .

## 2019-11-18 NOTE — Progress Notes (Signed)
Careteam: [name], NP as PCP - General (Geriatric Medicine) [name], MD as PCP - Cardiology (Cardiology) [name], [name], MD as PCP - Advanced Heart Failure (Cardiology) [name], MD as Consulting Physician (Endocrinology) [name], RN as [place] Management  PLACE OF SERVICE:  [place] Directive information    No Known Allergies  Chief Complaint  Patient presents with  . Follow-up    1 month follow up on appetite and blood sugar  . FYI    Patient roomed by NP, medical assistant obtained vitals at the end of visit      HPI: Patient is a 59 y.o. male for 1 month follow up Pt was seen last month after hypoglycemia which caused hospitalization.  He saw Dinah, Np after hospitalization  He has been following with Dr Loanne Drilling who has been adjusting his insulin, went from 10 units of Tresiba to 16 units today. No hypoglycemia.  No blood sugar readings.  He was started on remeron at last OV, he was previously not sleeping and to help with appetite. Having bad dreams, he feels like he is somewhere that he can not get out of. Wants to stop the medication.  He is now getting 3 times daily, he has increased his intake.   shortness of breath has improved, off and on, overall still has it but better. Reports he walked from [place] to here and did okay, without significant shortness of breath.   Review of Systems:  Review of Systems  Constitutional: Negative for chills, fever and malaise/fatigue.  Respiratory: Positive for shortness of breath (has improved).   Cardiovascular: Negative for chest pain and leg swelling.  Neurological: Negative for dizziness, focal weakness and weakness.  Psychiatric/Behavioral: Negative for depression. The patient is not nervous/anxious and does not have insomnia.     Past Medical History:  Diagnosis Date  . Adenoidal hypertrophy   . Adenomatous colon polyps  2012  . Arthritis   . Diabetes mellitus   . GERD (gastroesophageal reflux disease)   . Heart attack ([place])    While living in [place]  [name] Hx of adenomatous colonic polyps 08/18/2017  . Hyperlipidemia   . Hypertension   . Mild CAD    a. cath in 08/2017 showing mild nonobstructive CAD with scattered 20-30% stenosis.   . Nonischemic cardiomyopathy ([place])    a. EF 20-25% by echo in 09/2017 with cath showing mild CAD. b.  Last echo 12/2017 EF 35-40%, grade 2 DD.  [name] Obesity   . PAF (paroxysmal atrial fibrillation) ([place])   . Sleep apnea    cpap, pt says no longer has   Past Surgical History:  Procedure Laterality Date  . COLONOSCOPY  05/13/11   9 adenomas  . FOREARM SURGERY    . MUSCLE BIOPSY    . RIGHT/LEFT HEART CATH AND CORONARY ANGIOGRAPHY N/A 08/25/2017   Procedure: RIGHT/LEFT HEART CATH AND CORONARY ANGIOGRAPHY;  Surgeon: [name], MD;  Location: [facility];  Service: Cardiovascular;  Laterality: N/A;  . RIGHT/LEFT HEART CATH AND CORONARY ANGIOGRAPHY N/A 10/01/2019   Procedure: RIGHT/LEFT HEART CATH AND CORONARY ANGIOGRAPHY;  Surgeon: [name], MD;  Location: [facility];  Service: Cardiovascular;  Laterality: N/A;   Social History:   reports that he has never smoked. He has never used smokeless tobacco. He reports that he does not drink alcohol or use drugs.  Family History  Problem Relation Age  of Onset  . Heart disease Mother   . Heart attack Mother 18  . Hypertension Mother   . Hyperlipidemia Mother   . Diabetes Mother   . Heart disease Father   . Heart attack Father 46  . Hypertension Father   . Hyperlipidemia Father   . Diabetes Father   . Heart attack Sister 13  . Colon cancer Neg Hx   . Stomach cancer Neg Hx   . Esophageal cancer Neg Hx   . Rectal cancer Neg Hx     Medications: Patient's Medications  New Prescriptions   No medications on file  Previous Medications   ALBUTEROL (VENTOLIN HFA) 108 (90 BASE) MCG/ACT INHALER     Inhale 2 puffs into the lungs every 6 (six) hours as needed for wheezing or shortness of breath.   AMITRIPTYLINE (ELAVIL) 10 MG TABLET    Take 2 tablets at bed time   APIXABAN (ELIQUIS) 5 MG TABS TABLET    Take 1 tablet (5 mg total) by mouth 2 (two) times daily.   CARVEDILOL (COREG) 6.25 MG TABLET    Take 1 tablet (6.25 mg total) by mouth 2 (two) times daily with a meal.   DIGOXIN (LANOXIN) 0.125 MG TABLET    Take 1 tablet (0.125 mg total) by mouth daily.   DONEPEZIL (ARICEPT) 10 MG TABLET    Take 1 tablet (10 mg total) by mouth at bedtime.   EVOLOCUMAB (REPATHA SURECLICK) 830 MG/ML SOAJ    Inject 1 pen into the skin every 14 (fourteen) days.   EZETIMIBE (ZETIA) 10 MG TABLET    Take 1 tablet (10 mg total) by mouth daily.   INSULIN DEGLUDEC (TRESIBA FLEXTOUCH) 200 UNIT/ML FLEXTOUCH PEN    Inject 16 Units into the skin daily.   INSULIN SYRINGE-NEEDLE U-100 (INSULIN SYRINGE .3CC/29GX1/2") 29G X 1/2" 0.3 ML MISC    Inject 1 Syringe 3 (three) times daily as directed. Check blood sugar three times daily. Dx: E11.9   LANCETS (ONETOUCH DELICA PLUS [id number]) MISC    USE TWICE DAILY   LISINOPRIL (ZESTRIL) 10 MG TABLET    Take 1 tablet (10 mg total) by mouth at bedtime.   NITROGLYCERIN (NITROSTAT) 0.4 MG SL TABLET    Place 1 tablet (0.4 mg total) under the tongue every 5 (five) minutes as needed for chest pain.   ONETOUCH ULTRA TEST STRIP    USE TWICE DAILY   POLYETHYLENE GLYCOL (MIRALAX / GLYCOLAX) 17 G PACKET    Take 17 g by mouth 2 (two) times daily.   SENNA (SENOKOT) 8.6 MG TABS TABLET    Take 1 tablet (8.6 mg total) by mouth daily as needed for mild constipation.   SPIRONOLACTONE (ALDACTONE) 25 MG TABLET    Take 1 tablet (25 mg total) by mouth daily.  Modified Medications   No medications on file  Discontinued Medications   MIRTAZAPINE (REMERON) 7.5 MG TABLET    Take 1 tablet (7.5 mg total) by mouth at bedtime.    Physical Exam:  Vitals:   11/18/19 1507  BP: 122/78  Pulse: 82  Temp: (!) 97.3  F (36.3 C)  TempSrc: Temporal  SpO2: 99%  Weight: 162 lb 12.8 oz (73.8 kg)  Height: 5' 4"  (1.626 m)   Body mass index is 27.94 kg/m. Wt Readings from Last 3 Encounters:  11/18/19 162 lb 12.8 oz (73.8 kg)  11/18/19 163 lb (73.9 kg)  11/11/19 161 lb (73 kg)    Physical Exam Constitutional:  General: He is not in acute distress.    Appearance: He is well-developed. He is not diaphoretic.  HENT:     Head: Normocephalic and atraumatic.  Eyes:     Conjunctiva/sclera: Conjunctivae normal.     Pupils: Pupils are equal, round, and reactive to light.  Cardiovascular:     Rate and Rhythm: Normal rate and regular rhythm.     Heart sounds: Normal heart sounds.  Pulmonary:     Effort: Pulmonary effort is normal.     Breath sounds: Normal breath sounds.  Musculoskeletal:        General: No tenderness.     Cervical back: Normal range of motion and neck supple.  Skin:    General: Skin is warm and dry.  Neurological:     Mental Status: He is alert and oriented to person, place, and time. Mental status is at baseline.  Psychiatric:        Mood and Affect: Mood normal.    Labs reviewed: Basic Metabolic Panel: Recent Labs    06/17/19 1240 06/17/19 1300 06/17/19 1643 06/17/19 1650 06/28/19 1240 06/28/19 1819 06/29/19 0250 07/13/19 2042 10/16/19 0403 10/17/19 0419 10/18/19 0607 10/21/19 1618 10/25/19 1014  NA 133*  --   --    < >  --    < > 134*   < > 138   < > 138 138 137  K 4.0  --   --    < >  --    < > 4.7   < > 3.9   < > 4.2 4.9 4.5  CL 97*  --   --    < >  --    < > 96*   < > 103   < > 102 100 101  CO2 22  --   --    < >  --    < > 25   < > 26   < > 25 29 27   GLUCOSE 254*  --   --    < >  --    < > 143*   < > 146*   < > 138* 162* 247*  BUN 21*  --   --    < >  --    < > 26*   < > 8   < > 7 17 21*  CREATININE 1.65*  --   --    < >  --    < > 1.00   < > 1.05   < > 1.08 1.19 1.24  CALCIUM 8.6*  --   --    < >  --    < > 8.6*   < > 8.7*   < > 8.5* 9.7 9.1  MG 2.4  --    --    < > 2.6*  --  2.0  --  1.9  --   --   --   --   PHOS 3.1  --   --   --   --   --   --   --   --   --   --   --   --   TSH  --  1.854 2.428  --   --   --   --   --   --   --   --  1.72  --    < > = values in this interval not displayed.   Liver Function Tests: Recent Labs    06/27/19  0205 06/27/19 0205 06/29/19 0250 08/28/19 0943 10/13/19 2202  AST 29   < > 23 12 28   ALT 23   < > 23 12 28   ALKPHOS 63  --  54  --  58  BILITOT 0.5   < > 0.4 0.4 0.4  PROT 6.3*   < > 5.6* 5.8* 6.2*  ALBUMIN 2.7*  --  2.6*  --  3.5   < > = values in this interval not displayed.   Recent Labs    01/23/19 1500  LIPASE 31   No results for input(s): AMMONIA in the last 8760 hours. CBC: Recent Labs    08/28/19 0943 09/27/19 1527 10/13/19 2202 10/13/19 2211 10/15/19 1345 10/15/19 1359 10/15/19 1400 10/16/19 0403 10/21/19 1618  WBC 4.6   < > 4.7   < > 8.1  --   --  7.8 7.2  NEUTROABS 2,130  --  3.6  --   --   --   --   --  3,982  HGB 13.2   < > 14.7   < > 15.0   < > 15.3 14.0 15.5  HCT 38.9   < > 43.6   < > 45.5   < > 45.0 41.3 46.3  MCV 92.0   < > 93.0   < > 91.5  --   --  90.8 91.5  PLT 232   < > 203   < > 290  --   --  252 338   < > = values in this interval not displayed.   Lipid Panel: Recent Labs    04/08/19 0801  CHOL 152  HDL 57  LDLCALC 81  TRIG 70  CHOLHDL 2.7   TSH: Recent Labs    06/17/19 1300 06/17/19 1643 10/21/19 1618  TSH 1.854 2.428 1.72   A1C: Lab Results  Component Value Date   HGBA1C 9.2 (A) 10/09/2019     Assessment/Plan 1. Type 2 diabetes mellitus with diabetic neuropathy, with long-term current use of insulin ([place]) Following closely with endocrinology at this time. Saw Endocrine today It appears his blood sugar has increased with reduction of tresiba but he was hesitate to increase insulin past 16 units daily. Encouraged proper nutrition and diabetic diet.   2. Poor appetite -he has been started on remeron however with vivid bad dreams  that he can not tolerate. Would like to come off medication. Will titrate off remeron and monitor. Continue to eat 3 meals a day with proper nutritional value to avoid hypoglycemia  3. Shortness of breath -ongoing but has improved post COVID- he was able to walk to appt without significant shortness of breath.   Next appt: 3 months for routine follow up  [place] [place], [facility] [phone]

## 2019-11-18 NOTE — Patient Instructions (Addendum)
Please increase the Tresiba to 16 units daily.  On this type of insulin, you should eat meals on a regular schedule.  If a meal is missed or significantly delayed, your blood sugar could go low.   check your blood sugar twice a day.  vary the time of day when you check, between before the 3 meals, and at bedtime.  also check if you have symptoms of your blood sugar being too high or too low.  please keep a record of the readings and bring it to your next appointment here (or you can bring the meter itself).  You can write it on any piece of paper.  please call us sooner if your blood sugar goes below 70, or if you have a lot of readings over 200. Please come back for a follow-up appointment in 5-6 weeks.

## 2019-11-22 ENCOUNTER — Telehealth: Payer: Self-pay

## 2019-11-22 NOTE — Telephone Encounter (Signed)
FAXED [place]: [facility]  Document: Statement from provider for therapeutic shoes, In person foot exam ([name] exam is on Epic) Other records requested: None  All above requested information has been faxed successfully to [employer] listed above. Documents and fax confirmation have been placed in the faxed file for future reference.

## 2019-11-25 ENCOUNTER — Other Ambulatory Visit (HOSPITAL_COMMUNITY): Payer: Self-pay

## 2019-11-26 NOTE — Progress Notes (Signed)
Pt did not make it home by our appointment time.  He did leave his pill boxes there so I did fill 2 of those up.    Marylouise Stacks, EMT-Paramedic  11/26/19

## 2019-11-27 ENCOUNTER — Other Ambulatory Visit: Payer: Self-pay | Admitting: Family

## 2019-11-28 ENCOUNTER — Other Ambulatory Visit: Payer: Self-pay | Admitting: *Deleted

## 2019-11-28 ENCOUNTER — Telehealth (HOSPITAL_COMMUNITY): Payer: Self-pay | Admitting: Licensed Clinical Social Worker

## 2019-11-28 NOTE — Patient Outreach (Signed)
[facility]) Care Management  11/28/2019  Calvin [name] June 18, 1961 [id number]   Call placed to member to follow up on management of diabetes and heart failure.  He report he is feeling better but blood sugars still slightly elevated, today was 245.  Was seen by PCP and endocrinologist on 5/17, [name] was increased to 16 units.  Titration has been slow due to risk for hypoglycemia.  State he is eating 3 meals a day even though at times he does not have an appetite.  He has been receiving meals, inquires about how much longer he will be getting them.  Advised that this care manager will contact CSW that set up meals to follow up with member directly.    Noted that EMT attempted to visit member in the home on 5/24 however he was not there.  She was able to fill his pill boxes.  Advised that it is important that he is available for assessment during paramedicine visits, he verbalizes understanding.  Will have next visit within the next 2 weeks.  He has appointments with pulmonology on 6/2, podiatrist on 6/11, and endocrinologist on 6/29.  Report he is trying to get order for diabetic shoes however order has not been received from endo.  Foot center will attempt again to get order.  Member denies any urgent concerns, will follow up within the next month.  [facility] CM Care Plan Problem One     Most Recent Value  Care Plan Problem One  Knowledge deficit related to disease process and mgmt of Diabetes  Role Documenting the Problem One  Care Management Telephonic Coordinator  Care Plan for Problem One  Active  [facility] Long Term Goal   Patient will report a lowering/decrease in A1C level of 9 over the next 90 days.  [facility] Long Term Goal Start Date  10/22/19  Interventions for Problem One Long Term Goal  MD visit AVS reviewed with member, PCP and endocrinologist, re-educated on plan of care to include diabetic diet and medication management  [facility] CM Short Term Goal #1   Member will report taking  all medications as instructed over the next 4 weeks, especially insulin  [facility] CM Short Term Goal #1 Start Date  10/22/19  [employer] CM Short Term Goal #1 Met Date  11/28/19  [facility] CM Short Term Goal #2   Member will keep and attend follow up with endocrinologist within the next 4 weeks  [facility] CM Short Term Goal #2 Start Date  10/29/19  [facility] CM Short Term Goal #2 Met Date  11/28/19     Calvin David, RN, MSN [place] [phone]

## 2019-11-28 NOTE — Telephone Encounter (Signed)
CSW contacted by [facility] coordinator who reports pt has questions about how much longer his [employer] will be going out.  Pt was referred for [employer] over a month ago so would not be getting anymore shipments as it was only for 4 weeks.  CSW called pt to inform.  Pt reports the meals have been very helpful to him so he is agreeable to having another month of meals- as his is active with [facility] he should be eligible to receive so CSW placed 4 week referral.  Will continue to follow and assist as needed  Jorge Ny, [facility] Desk#: [phone] Cell#: [phone]

## 2019-12-04 ENCOUNTER — Institutional Professional Consult (permissible substitution): Payer: Medicare Other | Admitting: Critical Care Medicine

## 2019-12-04 ENCOUNTER — Ambulatory Visit: Payer: Medicare Other | Admitting: Endocrinology

## 2019-12-04 NOTE — Progress Notes (Deleted)
Synopsis: Referred in June 2021 for SOB by [name], NP.  Subjective:   PATIENT ID: Calvin [name] GENDER: male DOB: [birth year], MRN: [id number]  No chief complaint on file.   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 fib on Eliquis,  cognitive impairment-Aricept CAD w/ MI 2015 OSA GERD COVID-22 June 2019; hospitalized with acute respiratory failure Ongoing fatigue since Covid  High CO--> cardiology recommends eval for peripheral shunting process Dr. Britta Mccreedy on 09/27/2019 note reviewed   Tobacco? Short of breath Cough Sputum Wheezing Can walk how far? Albuterol how often?   Past Medical History:  Diagnosis Date  . Adenoidal hypertrophy   . Adenomatous colon polyps 2012  . Arthritis   . Diabetes mellitus   . GERD (gastroesophageal reflux disease)   . Heart attack ([place])    [place] living in [place]  [name] Hx of adenomatous colonic polyps 08/18/2017  . Hyperlipidemia   . Hypertension   . Mild CAD    a. cath in 08/2017 showing mild nonobstructive CAD with scattered 20-30% stenosis.   . Nonischemic cardiomyopathy ([place])    a. EF 20-25% by echo in 09/2017 with cath showing mild CAD. b.  Last echo 12/2017 EF 35-40%, grade 2 DD.  [name] Obesity   . PAF (paroxysmal atrial fibrillation) ([place])   . Sleep apnea    cpap, pt says no longer has     Family History  Problem Relation Age of Onset  . Heart disease Mother   . Heart attack Mother 49  . Hypertension Mother   . Hyperlipidemia Mother   . Diabetes Mother   . Heart disease Father   . Heart attack Father 31  . Hypertension Father   . Hyperlipidemia Father   . Diabetes Father   . Heart attack Sister 2  . Colon cancer Neg Hx   . Stomach cancer Neg Hx   . Esophageal cancer Neg Hx   . Rectal cancer Neg Hx      Past Surgical History:  Procedure Laterality Date  . COLONOSCOPY  05/13/11   9 adenomas  . FOREARM SURGERY    . MUSCLE BIOPSY    . RIGHT/LEFT HEART CATH AND CORONARY ANGIOGRAPHY N/A 08/25/2017    Procedure: RIGHT/LEFT HEART CATH AND CORONARY ANGIOGRAPHY;  Surgeon: [name], MD;  Location: [facility];  Service: Cardiovascular;  Laterality: N/A;  . RIGHT/LEFT HEART CATH AND CORONARY ANGIOGRAPHY N/A 10/01/2019   Procedure: RIGHT/LEFT HEART CATH AND CORONARY ANGIOGRAPHY;  Surgeon: [name], MD;  Location: [facility];  Service: Cardiovascular;  Laterality: N/A;    Social History   Socioeconomic History  . Marital status: Single    Spouse name: Not on file  . Number of children: 0  . Years of education: Not on file  . Highest education level: Not on file  Occupational History  . Occupation: Disability  Tobacco Use  . Smoking status: Never Smoker  . Smokeless tobacco: Never Used  Substance and Sexual Activity  . Alcohol use: No  . Drug use: No  . Sexual activity: Yes    Birth control/protection: None  Other Topics Concern  . Not on file  Social History Narrative   Social History   Diet?    Do you drink/eat things with caffeine? yes   Marital status?       single      Do you live in a house, apartment, assisted living, condo, trailer, etc.? yes   Is it  one or more stories? One story   How many persons live in your home?   Do you have any pets in your home? (please list)    Highest level of education completed? graduate   Do you exercise?            no                          Type & how often?   Advanced Directives   Do you have a living will? no   Do you have a DNR form?                                  If not, do you want to discuss one? no   Do you have signed POA/HPOA for forms? no      Functional Status   Do you have difficulty bathing or dressing yourself? no   Do you have difficulty preparing food or eating? no   Do you have difficulty managing your medications? no   Do you have difficulty managing your finances? no   Do you have difficulty affording your medications? no   Social Determinants of Health   Financial  Resource Strain:   . Difficulty of Paying Living Expenses:   Food Insecurity: No Food Insecurity  . Worried About Charity fundraiser in the Last Year: Never true  . Ran Out of Food in the Last Year: Never true  Transportation Needs: Unmet Transportation Needs  . Lack of Transportation (Medical): Yes  . Lack of Transportation (Non-Medical): Yes  Physical Activity:   . Days of Exercise per Week:   . Minutes of Exercise per Session:   Stress:   . Feeling of Stress :   Social Connections:   . Frequency of Communication with Friends and Family:   . Frequency of Social Gatherings with Friends and Family:   . Attends Religious Services:   . Active Member of Clubs or Organizations:   . Attends Archivist Meetings:   [name] Marital Status:   Intimate Partner Violence:   . Fear of Current or Ex-Partner:   . Emotionally Abused:   [name] Physically Abused:   . Sexually Abused:      No Known Allergies   Immunization History  Administered Date(s) Administered  . Influenza,inj,Quad PF,6+ Mos 05/10/2017, 05/02/2019  . Influenza-Unspecified 04/02/2014  . PFIZER SARS-COV-2 Vaccination 10/10/2019, 11/04/2019  . Pneumococcal Conjugate-13 06/20/2017  . Pneumococcal Polysaccharide-23 05/02/2019  . Tdap 09/28/2015    Outpatient Medications Prior to Visit  Medication Sig Dispense Refill  . albuterol (VENTOLIN HFA) 108 (90 Base) MCG/ACT inhaler Inhale 2 puffs into the lungs every 6 (six) hours as needed for wheezing or shortness of breath. 6.7 g 0  . amitriptyline (ELAVIL) 10 MG tablet Take 2 tablets at bed time (Patient taking differently: Take 20 mg by mouth at bedtime. ) 180 tablet 3  . apixaban (ELIQUIS) 5 MG TABS tablet Take 1 tablet (5 mg total) by mouth 2 (two) times daily. 60 tablet 5  . carvedilol (COREG) 6.25 MG tablet Take 1 tablet (6.25 mg total) by mouth 2 (two) times daily with a meal. 180 tablet 3  . digoxin (LANOXIN) 0.125 MG tablet Take 1 tablet (0.125 mg total) by mouth daily.  90 tablet 3  . donepezil (ARICEPT) 10 MG tablet Take 1 tablet (10 mg total) by mouth at  bedtime. 90 tablet 3  . Evolocumab (REPATHA SURECLICK) 259 MG/ML SOAJ Inject 1 pen into the skin every 14 (fourteen) days. 6 pen 3  . ezetimibe (ZETIA) 10 MG tablet Take 1 tablet (10 mg total) by mouth daily. 90 tablet 3  . insulin degludec (TRESIBA FLEXTOUCH) 200 UNIT/ML FlexTouch Pen Inject 16 Units into the skin daily.    . Insulin Syringe-Needle U-100 (INSULIN SYRINGE .3CC/29GX1/2") 29G X 1/2" 0.3 ML MISC Inject 1 Syringe 3 (three) times daily as directed. Check blood sugar three times daily. Dx: E11.9 (Patient taking differently: Inject 1 Syringe as directed See admin instructions. Check blood sugar three times daily. Dx: E11.9 ) 100 each 3  . Lancets (ONETOUCH DELICA PLUS [id number]) MISC USE TWICE DAILY 200 each 3  . lisinopril (ZESTRIL) 10 MG tablet Take 1 tablet (10 mg total) by mouth at bedtime. 90 tablet 3  . nitroGLYCERIN (NITROSTAT) 0.4 MG SL tablet Place 1 tablet (0.4 mg total) under the tongue every 5 (five) minutes as needed for chest pain. 30 tablet 1  . ONETOUCH ULTRA test strip USE TWICE DAILY 200 strip 3  . polyethylene glycol (MIRALAX / GLYCOLAX) 17 g packet Take 17 g by mouth 2 (two) times daily. 14 each 0  . senna (SENOKOT) 8.6 MG TABS tablet Take 1 tablet (8.6 mg total) by mouth daily as needed for mild constipation. 15 tablet 0  . spironolactone (ALDACTONE) 25 MG tablet Take 1 tablet (25 mg total) by mouth daily. 90 tablet 3   No facility-administered medications prior to visit.    ROS   Objective:  There were no vitals filed for this visit.   on *** LPM *** RA BMI Readings from Last 3 Encounters:  11/18/19 27.94 kg/m  11/18/19 27.98 kg/m  11/11/19 27.64 kg/m   Wt Readings from Last 3 Encounters:  11/18/19 162 lb 12.8 oz (73.8 kg)  11/18/19 163 lb (73.9 kg)  11/11/19 161 lb (73 kg)    Physical Exam   CBC    Component Value Date/Time   WBC 7.2 10/21/2019 1618    RBC 5.06 10/21/2019 1618   HGB 15.5 10/21/2019 1618   HGB 15.6 08/16/2017 1037   HCT 46.3 10/21/2019 1618   HCT 51.0 06/17/2019 1643   PLT 338 10/21/2019 1618   PLT 226 08/16/2017 1037   MCV 91.5 10/21/2019 1618   MCV 89 08/16/2017 1037   MCH 30.6 10/21/2019 1618   MCHC 33.5 10/21/2019 1618   RDW 11.8 10/21/2019 1618   RDW 13.0 08/16/2017 1037   LYMPHSABS 2,614 10/21/2019 1618   MONOABS 0.2 10/13/2019 2202   EOSABS 79 10/21/2019 1618   BASOSABS 29 10/21/2019 1618    CHEMISTRY No results for input(s): NA, K, CL, CO2, GLUCOSE, BUN, CREATININE, CALCIUM, MG, PHOS in the last 168 hours. CrCl cannot be calculated (Patient's most recent lab result is older than the maximum 21 days allowed.). ***  Chest Imaging- films reviewed: CXR, 1 view 06/25/2019-cardiomegaly, likely atelectasis versus opacities bilateral bases.  Pulmonary Functions Testing Results: No flowsheet data found.   Echocardiogram 09/27/2019: LVEF 30 to 35%, moderately to severely decreased LV function with global hypokinesis, mild LVH.  Grade 1 diastolic dysfunction.  Normal LA, RV, RA.  Trivial MR mild aortic sclerosis without stenosis.  Heart Catheterization 10/01/19:      Mid LAD lesion is 40% stenosed.  Prox Cx to Dist Cx lesion is 40% stenosed.  Prox RCA to Mid RCA lesion is 30% stenosed.  Mid RCA lesion is  50% stenosed.  Dist RCA lesion is 30% stenosed.  Dist LAD lesion is 60% stenosed.   Findings:  Ao = 123/67 (91) LV = 123/10 RA =  3 RV = 22/4 PA = 21/8 (14) PCW = 8 Fick cardiac output/index = 7.4/4.3 PVR = 0.8 WU Ao sat = 100% PA sat = 79%, 80% High SVC sat = 85%  Assessment: 1. Moderate non-obstructive CAD 2. NICM EF 35-40% 3. Normal filling pressures 4. High cardiac output without evidence of intracardiac shunting  Plan/Discussion:  Medical therapy. Consider w/u for peripheral shunting process.   Assessment & Plan:     ICD-10-CM   1. Chronic HFrEF (heart failure with  reduced ejection fraction) (HCC)  I50.22   2. History of COVID-19  Z86.16       Current Outpatient Medications:  .  albuterol (VENTOLIN HFA) 108 (90 Base) MCG/ACT inhaler, Inhale 2 puffs into the lungs every 6 (six) hours as needed for wheezing or shortness of breath., Disp: 6.7 g, Rfl: 0 .  amitriptyline (ELAVIL) 10 MG tablet, Take 2 tablets at bed time (Patient taking differently: Take 20 mg by mouth at bedtime. ), Disp: 180 tablet, Rfl: 3 .  apixaban (ELIQUIS) 5 MG TABS tablet, Take 1 tablet (5 mg total) by mouth 2 (two) times daily., Disp: 60 tablet, Rfl: 5 .  carvedilol (COREG) 6.25 MG tablet, Take 1 tablet (6.25 mg total) by mouth 2 (two) times daily with a meal., Disp: 180 tablet, Rfl: 3 .  digoxin (LANOXIN) 0.125 MG tablet, Take 1 tablet (0.125 mg total) by mouth daily., Disp: 90 tablet, Rfl: 3 .  donepezil (ARICEPT) 10 MG tablet, Take 1 tablet (10 mg total) by mouth at bedtime., Disp: 90 tablet, Rfl: 3 .  Evolocumab (REPATHA SURECLICK) 245 MG/ML SOAJ, Inject 1 pen into the skin every 14 (fourteen) days., Disp: 6 pen, Rfl: 3 .  ezetimibe (ZETIA) 10 MG tablet, Take 1 tablet (10 mg total) by mouth daily., Disp: 90 tablet, Rfl: 3 .  insulin degludec (TRESIBA FLEXTOUCH) 200 UNIT/ML FlexTouch Pen, Inject 16 Units into the skin daily., Disp: , Rfl:  .  Insulin Syringe-Needle U-100 (INSULIN SYRINGE .3CC/29GX1/2") 29G X 1/2" 0.3 ML MISC, Inject 1 Syringe 3 (three) times daily as directed. Check blood sugar three times daily. Dx: E11.9 (Patient taking differently: Inject 1 Syringe as directed See admin instructions. Check blood sugar three times daily. Dx: E11.9 ), Disp: 100 each, Rfl: 3 .  Lancets (ONETOUCH DELICA PLUS [id number]) MISC, USE TWICE DAILY, Disp: 200 each, Rfl: 3 .  lisinopril (ZESTRIL) 10 MG tablet, Take 1 tablet (10 mg total) by mouth at bedtime., Disp: 90 tablet, Rfl: 3 .  nitroGLYCERIN (NITROSTAT) 0.4 MG SL tablet, Place 1 tablet (0.4 mg total) under the tongue every 5 (five)  minutes as needed for chest pain., Disp: 30 tablet, Rfl: 1 .  ONETOUCH ULTRA test strip, USE TWICE DAILY, Disp: 200 strip, Rfl: 3 .  polyethylene glycol (MIRALAX / GLYCOLAX) 17 g packet, Take 17 g by mouth 2 (two) times daily., Disp: 14 each, Rfl: 0 .  senna (SENOKOT) 8.6 MG TABS tablet, Take 1 tablet (8.6 mg total) by mouth daily as needed for mild constipation., Disp: 15 tablet, Rfl: 0 .  spironolactone (ALDACTONE) 25 MG tablet, Take 1 tablet (25 mg total) by mouth daily., Disp: 90 tablet, Rfl: 3   I spent *** minutes on this encounter, including face to face time and non-face to face time spent reviewing records, charting, coordinating care,  etc.   Julian Hy, DO [facility] 12/04/2019 8:20 AM

## 2019-12-09 ENCOUNTER — Other Ambulatory Visit (HOSPITAL_COMMUNITY): Payer: Self-pay

## 2019-12-10 NOTE — Progress Notes (Signed)
Paramedicine Encounter    Patient ID: Calvin [name], male    DOB: 1961-06-20, 59 y.o.   MRN: [id number]   [name], NP as PCP - General (Geriatric Medicine) [name], MD as PCP - Cardiology (Cardiology) [name], [name], MD as PCP - Advanced Heart Failure (Cardiology) [name], MD as Consulting Physician (Endocrinology) [name], RN as [facility]  Patient Active Problem List   Diagnosis Date Noted  . Hypoglycemia due to insulin 10/15/2019  . Chronic anticoagulation 10/15/2019  . COVID-19 virus detected 06/18/2019  . Right sided weakness 06/17/2019  . AKI (acute kidney injury) ([place]) 06/17/2019  . Foot pain, bilateral 02/01/2019  . Abdominal pain 01/23/2019  . Chest pain 01/23/2019  . Right leg pain 12/20/2018  . Overgrown toenails 12/20/2018  . Unsteady gait 12/20/2018  . Encounter for therapeutic drug monitoring 10/22/2018  . Nausea and vomiting 03/10/2018  . AF (paroxysmal atrial fibrillation) ([place]) 03/10/2018  . Non-cardiac chest pain 03/09/2018  . Diabetic neuropathy ([place]) 09/26/2017  . MCI (mild cognitive impairment) 06/23/2017  . OSA on CPAP 05/10/2017  . Gastroesophageal reflux disease 05/10/2017  . Insomnia 05/10/2017  . Onychomycosis of multiple toenails with type 2 diabetes mellitus and peripheral neuropathy ([place]) 05/10/2017  . Chronic systolic heart failure ([place]) 11/22/2015  . Essential hypertension 11/22/2015  . DM (diabetes mellitus) ([place]) 11/20/2015  . HLD (hyperlipidemia) 11/20/2015  . Hx of adenomatous colonic polyps 05/19/2011    Current Outpatient Medications:  .  albuterol (VENTOLIN HFA) 108 (90 Base) MCG/ACT inhaler, Inhale 2 puffs into the lungs every 6 (six) hours as needed for wheezing or shortness of breath., Disp: 6.7 g, Rfl: 0 .  amitriptyline (ELAVIL) 10 MG tablet, Take 2 tablets at bed time (Patient taking differently: Take 20 mg by mouth at bedtime. ),  Disp: 180 tablet, Rfl: 3 .  apixaban (ELIQUIS) 5 MG TABS tablet, Take 1 tablet (5 mg total) by mouth 2 (two) times daily., Disp: 60 tablet, Rfl: 5 .  carvedilol (COREG) 6.25 MG tablet, Take 1 tablet (6.25 mg total) by mouth 2 (two) times daily with a meal., Disp: 180 tablet, Rfl: 3 .  digoxin (LANOXIN) 0.125 MG tablet, Take 1 tablet (0.125 mg total) by mouth daily., Disp: 90 tablet, Rfl: 3 .  donepezil (ARICEPT) 10 MG tablet, Take 1 tablet (10 mg total) by mouth at bedtime., Disp: 90 tablet, Rfl: 3 .  Evolocumab (REPATHA SURECLICK) 621 MG/ML SOAJ, Inject 1 pen into the skin every 14 (fourteen) days., Disp: 6 pen, Rfl: 3 .  ezetimibe (ZETIA) 10 MG tablet, Take 1 tablet (10 mg total) by mouth daily., Disp: 90 tablet, Rfl: 3 .  insulin degludec (TRESIBA FLEXTOUCH) 200 UNIT/ML FlexTouch Pen, Inject 16 Units into the skin daily., Disp: , Rfl:  .  Insulin Syringe-Needle U-100 (INSULIN SYRINGE .3CC/29GX1/2") 29G X 1/2" 0.3 ML MISC, Inject 1 Syringe 3 (three) times daily as directed. Check blood sugar three times daily. Dx: E11.9 (Patient taking differently: Inject 1 Syringe as directed See admin instructions. Check blood sugar three times daily. Dx: E11.9 ), Disp: 100 each, Rfl: 3 .  Lancets (ONETOUCH DELICA PLUS [id number]) MISC, USE TWICE DAILY, Disp: 200 each, Rfl: 3 .  lisinopril (ZESTRIL) 10 MG tablet, Take 1 tablet (10 mg total) by mouth at bedtime., Disp: 90 tablet, Rfl: 3 .  nitroGLYCERIN (NITROSTAT) 0.4 MG SL tablet, Place 1 tablet (0.4 mg total) under the tongue every 5 (five) minutes as needed for  chest pain., Disp: 30 tablet, Rfl: 1 .  ONETOUCH ULTRA test strip, USE TWICE DAILY, Disp: 200 strip, Rfl: 3 .  polyethylene glycol (MIRALAX / GLYCOLAX) 17 g packet, Take 17 g by mouth 2 (two) times daily., Disp: 14 each, Rfl: 0 .  senna (SENOKOT) 8.6 MG TABS tablet, Take 1 tablet (8.6 mg total) by mouth daily as needed for mild constipation., Disp: 15 tablet, Rfl: 0 .  spironolactone (ALDACTONE) 25 MG  tablet, Take 1 tablet (25 mg total) by mouth daily., Disp: 90 tablet, Rfl: 3 No Known Allergies    Social History   Socioeconomic History  . Marital status: Single    Spouse name: Not on file  . Number of children: 0  . Years of education: Not on file  . Highest education level: Not on file  Occupational History  . Occupation: Disability  Tobacco Use  . Smoking status: Never Smoker  . Smokeless tobacco: Never Used  Substance and Sexual Activity  . Alcohol use: No  . Drug use: No  . Sexual activity: Yes    Birth control/protection: None  Other Topics Concern  . Not on file  Social History Narrative   Social History   Diet?    Do you drink/eat things with caffeine? yes   Marital status?       single      Do you live in a house, apartment, assisted living, condo, trailer, etc.? yes   Is it one or more stories? One story   How many persons live in your home?   Do you have any pets in your home? (please list)    Highest level of education completed? graduate   Do you exercise?            no                          Type & how often?   Advanced Directives   Do you have a living will? no   Do you have a DNR form?                                  If not, do you want to discuss one? no   Do you have signed POA/HPOA for forms? no      Functional Status   Do you have difficulty bathing or dressing yourself? no   Do you have difficulty preparing food or eating? no   Do you have difficulty managing your medications? no   Do you have difficulty managing your finances? no   Do you have difficulty affording your medications? no   Social Determinants of Health   Financial Resource Strain:   . Difficulty of Paying Living Expenses:   Food Insecurity: No Food Insecurity  . Worried About Charity fundraiser in the Last Year: Never true  . Ran Out of Food in the Last Year: Never true  Transportation Needs: Unmet Transportation Needs  . Lack of Transportation (Medical): Yes  . Lack  of Transportation (Non-Medical): Yes  Physical Activity:   . Days of Exercise per Week:   . Minutes of Exercise per Session:   Stress:   . Feeling of Stress :   Social Connections:   . Frequency of Communication with Friends and Family:   . Frequency of Social Gatherings with Friends and Family:   . Attends Religious Services:   .  Active Member of Clubs or Organizations:   . Attends Archivist Meetings:   [name] Marital Status:   Intimate Partner Violence:   . Fear of Current or Ex-Partner:   . Emotionally Abused:   [name] Physically Abused:   . Sexually Abused:     Physical Exam      Future Appointments  Date Time Provider [place]  12/13/2019  4:15 PM Felipa Furnace, DPM [facility]  12/31/2019  1:30 PM [name], MD [facility] None  01/01/2020  9:30 AM [name], RN [facility] None  01/20/2020  9:30 AM [name], NP [facility] None  02/04/2020  9:15 AM Imogene Burn, [name] [facility]  03/18/2020 11:30 AM Cameron Sprang, MD [facility] None    BP (!) 150/82   Pulse 98   Temp 97.9 F (36.6 C)   Resp 18   Wt 161 lb (73 kg)   SpO2 99%   BMI 27.64 kg/m  CBG EMS-316 Weight yesterday-161 Last visit weight-161  Pt reports he has been feeling better, much improved. Pt states his breathing is doing great. He denies c/p, he sometimes gets dizzy, then it subsides quickly. No missed doses of his meds.  2 weeks of pill boxes filled.  He reports his CBG's have been running around 240s-150s.  He has been drinking a lot of juices and not limiting his sugar/carb intake. He is getting the moms meals for now. I have encouraged him to control his diet better as that will contribute to better control of his diabetes.  He said he would try.   Marylouise Stacks, [facility] Paramedic  12/10/19

## 2019-12-13 ENCOUNTER — Ambulatory Visit: Payer: Medicare Other | Admitting: Podiatry

## 2019-12-13 ENCOUNTER — Telehealth: Payer: Self-pay | Admitting: Podiatry

## 2019-12-13 NOTE — Telephone Encounter (Signed)
Left message for pt that Dr Loanne Drilling is disagreeing with what our doctor is saying( pt has foot deformity) therefore we are not able to get diabetic shoes covered by insurance.Marland KitchenMarland Kitchen

## 2019-12-23 ENCOUNTER — Other Ambulatory Visit (HOSPITAL_COMMUNITY): Payer: Self-pay

## 2019-12-23 NOTE — Progress Notes (Signed)
Paramedicine Encounter    Patient ID: Calvin [name], male    DOB: 1961/06/08, 59 y.o.   MRN: [id number]   [name], NP as PCP - General (Geriatric Medicine) [name], MD as PCP - Cardiology (Cardiology) [name], [name], MD as PCP - Advanced Heart Failure (Cardiology) [name], MD as Consulting Physician (Endocrinology) [name], RN as [place] [facility]  Patient Active Problem List   Diagnosis Date Noted  . Hypoglycemia due to insulin 10/15/2019  . Chronic anticoagulation 10/15/2019  . COVID-19 virus detected 06/18/2019  . Right sided weakness 06/17/2019  . AKI (acute kidney injury) ([name]) 06/17/2019  . Foot pain, bilateral 02/01/2019  . Abdominal pain 01/23/2019  . Chest pain 01/23/2019  . Right leg pain 12/20/2018  . Overgrown toenails 12/20/2018  . Unsteady gait 12/20/2018  . Encounter for therapeutic drug monitoring 10/22/2018  . Nausea and vomiting 03/10/2018  . AF (paroxysmal atrial fibrillation) ([place]) 03/10/2018  . Non-cardiac chest pain 03/09/2018  . Diabetic neuropathy ([place]) 09/26/2017  . MCI (mild cognitive impairment) 06/23/2017  . OSA on CPAP 05/10/2017  . Gastroesophageal reflux disease 05/10/2017  . Insomnia 05/10/2017  . Onychomycosis of multiple toenails with type 2 diabetes mellitus and peripheral neuropathy ([place]) 05/10/2017  . Chronic systolic heart failure ([place]) 11/22/2015  . Essential hypertension 11/22/2015  . DM (diabetes mellitus) ([place]) 11/20/2015  . HLD (hyperlipidemia) 11/20/2015  . Hx of adenomatous colonic polyps 05/19/2011    Current Outpatient Medications:  .  albuterol (VENTOLIN HFA) 108 (90 Base) MCG/ACT inhaler, Inhale 2 puffs into the lungs every 6 (six) hours as needed for wheezing or shortness of breath., Disp: 6.7 g, Rfl: 0 .  amitriptyline (ELAVIL) 10 MG tablet, Take 2 tablets at bed time (Patient taking differently: Take 20 mg by mouth at bedtime. ),  Disp: 180 tablet, Rfl: 3 .  apixaban (ELIQUIS) 5 MG TABS tablet, Take 1 tablet (5 mg total) by mouth 2 (two) times daily., Disp: 60 tablet, Rfl: 5 .  carvedilol (COREG) 6.25 MG tablet, Take 1 tablet (6.25 mg total) by mouth 2 (two) times daily with a meal., Disp: 180 tablet, Rfl: 3 .  digoxin (LANOXIN) 0.125 MG tablet, Take 1 tablet (0.125 mg total) by mouth daily., Disp: 90 tablet, Rfl: 3 .  donepezil (ARICEPT) 10 MG tablet, Take 1 tablet (10 mg total) by mouth at bedtime., Disp: 90 tablet, Rfl: 3 .  Evolocumab (REPATHA SURECLICK) 027 MG/ML SOAJ, Inject 1 pen into the skin every 14 (fourteen) days., Disp: 6 pen, Rfl: 3 .  ezetimibe (ZETIA) 10 MG tablet, Take 1 tablet (10 mg total) by mouth daily., Disp: 90 tablet, Rfl: 3 .  insulin degludec (TRESIBA FLEXTOUCH) 200 UNIT/ML FlexTouch Pen, Inject 16 Units into the skin daily., Disp: , Rfl:  .  Insulin Syringe-Needle U-100 (INSULIN SYRINGE .3CC/29GX1/2") 29G X 1/2" 0.3 ML MISC, Inject 1 Syringe 3 (three) times daily as directed. Check blood sugar three times daily. Dx: E11.9 (Patient taking differently: Inject 1 Syringe as directed See admin instructions. Check blood sugar three times daily. Dx: E11.9 ), Disp: 100 each, Rfl: 3 .  Lancets (ONETOUCH DELICA PLUS [id number]) MISC, USE TWICE DAILY, Disp: 200 each, Rfl: 3 .  lisinopril (ZESTRIL) 10 MG tablet, Take 1 tablet (10 mg total) by mouth at bedtime., Disp: 90 tablet, Rfl: 3 .  ONETOUCH ULTRA test strip, USE TWICE DAILY, Disp: 200 strip, Rfl: 3 .  polyethylene glycol (MIRALAX / GLYCOLAX) 17 g packet,  Take 17 g by mouth 2 (two) times daily., Disp: 14 each, Rfl: 0 .  senna (SENOKOT) 8.6 MG TABS tablet, Take 1 tablet (8.6 mg total) by mouth daily as needed for mild constipation., Disp: 15 tablet, Rfl: 0 .  nitroGLYCERIN (NITROSTAT) 0.4 MG SL tablet, Place 1 tablet (0.4 mg total) under the tongue every 5 (five) minutes as needed for chest pain. (Patient not taking: Reported on 12/10/2019), Disp: 30 tablet, Rfl:  1 .  spironolactone (ALDACTONE) 25 MG tablet, Take 1 tablet (25 mg total) by mouth daily., Disp: 90 tablet, Rfl: 3 No Known Allergies    Social History   Socioeconomic History  . Marital status: Single    Spouse name: Not on file  . Number of children: 0  . Years of education: Not on file  . Highest education level: Not on file  Occupational History  . Occupation: Disability  Tobacco Use  . Smoking status: Never Smoker  . Smokeless tobacco: Never Used  Vaping Use  . Vaping Use: Never used  Substance and Sexual Activity  . Alcohol use: No  . Drug use: No  . Sexual activity: Yes    Birth control/protection: None  Other Topics Concern  . Not on file  Social History Narrative   Social History   Diet?    Do you drink/eat things with caffeine? yes   Marital status?       single      Do you live in a house, apartment, assisted living, condo, trailer, etc.? yes   Is it one or more stories? One story   How many persons live in your home?   Do you have any pets in your home? (please list)    Highest level of education completed? graduate   Do you exercise?            no                          Type & how often?   Advanced Directives   Do you have a living will? no   Do you have a DNR form?                                  If not, do you want to discuss one? no   Do you have signed POA/HPOA for forms? no      Functional Status   Do you have difficulty bathing or dressing yourself? no   Do you have difficulty preparing food or eating? no   Do you have difficulty managing your medications? no   Do you have difficulty managing your finances? no   Do you have difficulty affording your medications? no   Social Determinants of Health   Financial Resource Strain:   . Difficulty of Paying Living Expenses:   Food Insecurity: No Food Insecurity  . Worried About Charity fundraiser in the Last Year: Never true  . Ran Out of Food in the Last Year: Never true  Transportation Needs:  Unmet Transportation Needs  . Lack of Transportation (Medical): Yes  . Lack of Transportation (Non-Medical): Yes  Physical Activity:   . Days of Exercise per Week:   . Minutes of Exercise per Session:   Stress:   . Feeling of Stress :   Social Connections:   . Frequency of Communication with Friends and Family:   .  Frequency of Social Gatherings with Friends and Family:   . Attends Religious Services:   . Active Member of Clubs or Organizations:   . Attends Archivist Meetings:   [name] Marital Status:   Intimate Partner Violence:   . Fear of Current or Ex-Partner:   . Emotionally Abused:   [name] Physically Abused:   . Sexually Abused:     Physical Exam      Future Appointments  Date Time Provider [place]  12/31/2019  1:30 PM [name], MD [facility] None  01/01/2020  9:30 AM [name], RN [facility] None  01/17/2020  3:15 PM Felipa Furnace, DPM [facility]  01/20/2020  9:30 AM [name], NP [facility] None  02/04/2020  9:15 AM Imogene Burn, [name] [facility]  03/18/2020 11:30 AM Cameron Sprang, MD [facility] None    BP 110/68   Pulse 93   Temp 99.3 F (37.4 C)   Resp 18   Wt 161 lb (73 kg)   SpO2 99%   BMI 27.64 kg/m  CBG PTA-245 CBG EMS-408 Weight yesterday-161 Last visit weight-161  Pt reports he is doing ok, he has felt tired and h/a lately. Sleeping ok.  CBG's up and down. Today he ate hotdogs and fried chicken.  Eating a lot of food with a lot of salt in it. CBG high today. He drank a lot of fruit juices today as well.  Ive asked him to avoid those sugary drinks and high sodium foods.  His med assistance has been completed and all of his meds except eliquis is free and eliquis has a small co-pay.  His main issues is choosing healthier food options, we have gotten him with moms meals to help that and coach him at each visit about foods to eat and ones to avoid.  At next visit, we will work together with him filling  his own pill box and get him ready to be graduated from [employer].  He is agreeable to this plan and will d/c once he is comfortable with his med regimen.   Marylouise Stacks, [facility] Paramedic  12/23/19

## 2019-12-24 ENCOUNTER — Encounter (HOSPITAL_COMMUNITY): Payer: Self-pay | Admitting: Emergency Medicine

## 2019-12-24 ENCOUNTER — Other Ambulatory Visit: Payer: Self-pay

## 2019-12-24 ENCOUNTER — Emergency Department (HOSPITAL_COMMUNITY): Payer: Medicare Other

## 2019-12-24 ENCOUNTER — Emergency Department (HOSPITAL_COMMUNITY)
Admission: EM | Admit: 2019-12-24 | Discharge: 2019-12-24 | Disposition: A | Discharge status: Home / Self Care | Payer: Medicare Other | Attending: Emergency Medicine | Admitting: Emergency Medicine

## 2019-12-24 DIAGNOSIS — Z8616 Personal history of COVID-19: Secondary | ICD-10-CM | POA: Insufficient documentation

## 2019-12-24 DIAGNOSIS — Z794 Long term (current) use of insulin: Secondary | ICD-10-CM | POA: Diagnosis not present

## 2019-12-24 DIAGNOSIS — Z79899 Other long term (current) drug therapy: Secondary | ICD-10-CM | POA: Insufficient documentation

## 2019-12-24 DIAGNOSIS — J90 Pleural effusion, not elsewhere classified: Secondary | ICD-10-CM | POA: Diagnosis not present

## 2019-12-24 DIAGNOSIS — Z7901 Long term (current) use of anticoagulants: Secondary | ICD-10-CM | POA: Diagnosis not present

## 2019-12-24 DIAGNOSIS — E119 Type 2 diabetes mellitus without complications: Secondary | ICD-10-CM | POA: Diagnosis not present

## 2019-12-24 DIAGNOSIS — R0789 Other chest pain: Secondary | ICD-10-CM | POA: Diagnosis not present

## 2019-12-24 DIAGNOSIS — I2510 Atherosclerotic heart disease of native coronary artery without angina pectoris: Secondary | ICD-10-CM | POA: Diagnosis not present

## 2019-12-24 DIAGNOSIS — I10 Essential (primary) hypertension: Secondary | ICD-10-CM | POA: Diagnosis not present

## 2019-12-24 DIAGNOSIS — R079 Chest pain, unspecified: Secondary | ICD-10-CM | POA: Diagnosis not present

## 2019-12-24 LAB — CBC
HCT: 42.1 % (ref 39.0–52.0)
Hemoglobin: 14.2 g/dL (ref 13.0–17.0)
MCH: 30.1 pg (ref 26.0–34.0)
MCHC: 33.7 g/dL (ref 30.0–36.0)
MCV: 89.4 fL (ref 80.0–100.0)
Platelets: 251 10*3/uL (ref 150–400)
RBC: 4.71 MIL/uL (ref 4.22–5.81)
RDW: 12.1 % (ref 11.5–15.5)
WBC: 5.6 10*3/uL (ref 4.0–10.5)
nRBC: 0 % (ref 0.0–0.2)

## 2019-12-24 LAB — PROTIME-INR
INR: 1.1 (ref 0.8–1.2)
Prothrombin Time: 13.8 seconds (ref 11.4–15.2)

## 2019-12-24 LAB — TROPONIN I (HIGH SENSITIVITY)
Troponin I (High Sensitivity): 10 ng/L (ref <18)
Troponin I (High Sensitivity): 12 ng/L (ref <18)

## 2019-12-24 LAB — BASIC METABOLIC PANEL
Anion gap: 7 (ref 5–15)
BUN: 17 mg/dL (ref 6–20)
CO2: 25 mmol/L (ref 22–32)
Calcium: 9.2 mg/dL (ref 8.9–10.3)
Chloride: 102 mmol/L (ref 98–111)
Creatinine, Ser: 1.53 mg/dL — ABNORMAL HIGH (ref 0.61–1.24)
GFR calc Af Amer: 57 mL/min — ABNORMAL LOW (ref >60)
GFR calc non Af Amer: 49 mL/min — ABNORMAL LOW (ref >60)
Glucose, Bld: 246 mg/dL — ABNORMAL HIGH (ref 70–99)
Potassium: 4.1 mmol/L (ref 3.5–5.1)
Sodium: 134 mmol/L — ABNORMAL LOW (ref 135–145)

## 2019-12-24 MED ORDER — SODIUM CHLORIDE 0.9% FLUSH: 3.0000 mL | Freq: Once | INTRAVENOUS | Status: DC

## 2019-12-24 MED ORDER — ACETAMINOPHEN 500 MG PO TABS
500.0000 mg | ORAL_TABLET | Freq: Four times a day (QID) | ORAL | 0 refills | Status: DC | PRN
Start: 2019-12-24 — End: 2020-01-24

## 2019-12-24 MED ORDER — LIDOCAINE 4 % EX CREA
1.0000 | TOPICAL_CREAM | Freq: Three times a day (TID) | CUTANEOUS | 0 refills | Status: DC | PRN
Start: 2019-12-24 — End: 2021-01-15

## 2019-12-24 MED ORDER — ACETAMINOPHEN 500 MG PO TABS
1000.0000 mg | ORAL_TABLET | Freq: Once | ORAL | Status: AC
  Administered 2019-12-24: 1000 mg via ORAL
  Filled 2019-12-24: qty 2

## 2019-12-24 MED ORDER — CYCLOBENZAPRINE HCL 10 MG PO TABS
10.0000 mg | ORAL_TABLET | Freq: Two times a day (BID) | ORAL | 0 refills | Status: DC | PRN
Start: 2019-12-24 — End: 2020-05-25

## 2019-12-24 NOTE — Discharge Instructions (Addendum)
Your work-up today was reassuring with no evidence of heart attack or heart issues.  I suspect your pain is musculoskeletal as it hurts when I push on her chest wall.  I recommend treating this with extra strength Tylenol every 6 hours as needed for pain.  Do not exceed more than 4000 mg of Tylenol daily.  You can also take Flexeril which is a muscle relaxant twice daily as needed for muscle relaxation.  These medications can cause drowsiness so do not drive, drink alcohol or operate heavy machinery while taking these medications.  I typically recommend only taking them at night to help you get to sleep.  You can also cut them in half if they seem to be very strong.  I typically recommend only taking them around friends or family members that can make sure that you do not put yourself at an increased risk of falls due to drowsiness.  You can apply lidocaine cream to areas of pain.  You can also apply ice or heat, whichever feels best, 20 minutes of time a few times daily.  Follow-up with your primary care provider or cardiologist for reevaluation of symptoms.  Return to the emergency department if any concerning signs or symptoms develop such as worsening chest pains, shortness of breath, persistent vomiting, loss of consciousness.

## 2019-12-24 NOTE — ED Triage Notes (Signed)
Patient reports right chest pain with SOB and lightheaded onset yesterday , no cough or fever , denies emesis or diaphoresis .

## 2019-12-24 NOTE — ED Provider Notes (Signed)
[facility] EMERGENCY DEPARTMENT Provider Note   CSN: [id number] Arrival date & time: 12/24/19  1828     History Chief Complaint  Patient presents with  . Chest Pain    Calvin [name] is a 59 y.o. male with history of GERD, hypertension, hyperlipidemia, nonischemic cardiomyopathy, EF 30 to 35%, paroxysmal A. fib, OSA presents for evaluation of acute onset, constant chest pain beginning at around 10 AM yesterday morning while sitting at rest.  Reports that symptoms were initially on the right side and then began to radiate to the left side of the chest and into the left upper extremity at this point.  He reports chronic shortness of breath that manifested as dyspnea on exertion and orthopnea but states that this is baseline for him and unchanged.  He denies fevers, cough, nausea, vomiting, diaphoresis, lightheadedness, leg swelling, abdominal pain.  He has not tried anything for his symptoms.  He reports he has felt pain like this frequently in the past but that does not necessarily feel like the last time that he required admission for a catheterization.  He does report that last week he was moving heavy furniture.  He reports compliance with all of his medications, denies cigarette smoking or recreational drug use.   The history is provided by the patient.       Past Medical History:  Diagnosis Date  . Adenoidal hypertrophy   . Adenomatous colon polyps 2012  . Arthritis   . Diabetes mellitus   . GERD (gastroesophageal reflux disease)   . Heart attack ([place])    While living in [place]  [name] Hx of adenomatous colonic polyps 08/18/2017  . Hyperlipidemia   . Hypertension   . Mild CAD    a. cath in 08/2017 showing mild nonobstructive CAD with scattered 20-30% stenosis.   . Nonischemic cardiomyopathy ([place])    a. EF 20-25% by echo in 09/2017 with cath showing mild CAD. b.  Last echo 12/2017 EF 35-40%, grade 2 DD.  [name] Obesity   . PAF (paroxysmal atrial fibrillation) ([place])     . Sleep apnea    cpap, pt says no longer has    Patient Active Problem List   Diagnosis Date Noted  . Hypoglycemia due to insulin 10/15/2019  . Chronic anticoagulation 10/15/2019  . COVID-19 virus detected 06/18/2019  . Right sided weakness 06/17/2019  . AKI (acute kidney injury) ([place]) 06/17/2019  . Foot pain, bilateral 02/01/2019  . Abdominal pain 01/23/2019  . Chest pain 01/23/2019  . Right leg pain 12/20/2018  . Overgrown toenails 12/20/2018  . Unsteady gait 12/20/2018  . Encounter for therapeutic drug monitoring 10/22/2018  . Nausea and vomiting 03/10/2018  . AF (paroxysmal atrial fibrillation) ([place]) 03/10/2018  . Non-cardiac chest pain 03/09/2018  . Diabetic neuropathy ([place]) 09/26/2017  . MCI (mild cognitive impairment) 06/23/2017  . OSA on CPAP 05/10/2017  . Gastroesophageal reflux disease 05/10/2017  . Insomnia 05/10/2017  . Onychomycosis of multiple toenails with type 2 diabetes mellitus and peripheral neuropathy ([place]) 05/10/2017  . Chronic systolic heart failure ([place]) 11/22/2015  . Essential hypertension 11/22/2015  . DM (diabetes mellitus) ([place]) 11/20/2015  . HLD (hyperlipidemia) 11/20/2015  . Hx of adenomatous colonic polyps 05/19/2011    Past Surgical History:  Procedure Laterality Date  . COLONOSCOPY  05/13/11   9 adenomas  . FOREARM SURGERY    . MUSCLE BIOPSY    . RIGHT/LEFT HEART CATH AND CORONARY ANGIOGRAPHY N/A 08/25/2017   Procedure: RIGHT/LEFT HEART CATH AND CORONARY  ANGIOGRAPHY;  Surgeon: [name], MD;  Location: [facility] CV LAB;  Service: Cardiovascular;  Laterality: N/A;  . RIGHT/LEFT HEART CATH AND CORONARY ANGIOGRAPHY N/A 10/01/2019   Procedure: RIGHT/LEFT HEART CATH AND CORONARY ANGIOGRAPHY;  Surgeon: [name], MD;  Location: [facility] CV LAB;  Service: Cardiovascular;  Laterality: N/A;       Family History  Problem Relation Age of Onset  . Heart disease Mother   . Heart attack Mother 63  . Hypertension Mother    . Hyperlipidemia Mother   . Diabetes Mother   . Heart disease Father   . Heart attack Father 10  . Hypertension Father   . Hyperlipidemia Father   . Diabetes Father   . Heart attack Sister 53  . Colon cancer Neg Hx   . Stomach cancer Neg Hx   . Esophageal cancer Neg Hx   . Rectal cancer Neg Hx     Social History   Tobacco Use  . Smoking status: Never Smoker  . Smokeless tobacco: Never Used  Vaping Use  . Vaping Use: Never used  Substance Use Topics  . Alcohol use: No  . Drug use: No    Home Medications Prior to Admission medications   Medication Sig Start Date End Date Taking? Authorizing Provider  albuterol (VENTOLIN HFA) 108 (90 Base) MCG/ACT inhaler Inhale 2 puffs into the lungs every 6 (six) hours as needed for wheezing or shortness of breath. 06/30/19  Yes Thurnell Lose, MD  amitriptyline (ELAVIL) 10 MG tablet Take 2 tablets at bed time Patient taking differently: Take 20 mg by mouth at bedtime.  08/27/19  Yes Cameron Sprang, MD  acetaminophen (TYLENOL) 500 MG tablet Take 1 tablet (500 mg total) by mouth every 6 (six) hours as needed. 12/24/19   Nils Flack, Leoni Goodness A, [name]  apixaban (ELIQUIS) 5 MG TABS tablet Take 1 tablet (5 mg total) by mouth 2 (two) times daily. 11/05/19   Larey Dresser, MD  carvedilol (COREG) 6.25 MG tablet Take 1 tablet (6.25 mg total) by mouth 2 (two) times daily with a meal. 07/22/19   Larey Dresser, MD  cyclobenzaprine (FLEXERIL) 10 MG tablet Take 1 tablet (10 mg total) by mouth 2 (two) times daily as needed for muscle spasms. 12/24/19   Rodell Perna A, [name]  digoxin (LANOXIN) 0.125 MG tablet Take 1 tablet (0.125 mg total) by mouth daily. 08/20/19   Lyda Jester M, [name]  donepezil (ARICEPT) 10 MG tablet Take 1 tablet (10 mg total) by mouth at bedtime. 08/27/19   Cameron Sprang, MD  Evolocumab (REPATHA SURECLICK) 086 MG/ML SOAJ Inject 1 pen into the skin every 14 (fourteen) days. 07/22/19   [name], MD  ezetimibe (ZETIA) 10 MG  tablet Take 1 tablet (10 mg total) by mouth daily. 07/22/19   [name], MD  insulin degludec (TRESIBA FLEXTOUCH) 200 UNIT/ML FlexTouch Pen Inject 16 Units into the skin daily. 11/18/19   [name], MD  Insulin Syringe-Needle U-100 (INSULIN SYRINGE .3CC/29GX1/2") 29G X 1/2" 0.3 ML MISC Inject 1 Syringe 3 (three) times daily as directed. Check blood sugar three times daily. Dx: E11.9 Patient taking differently: Inject 1 Syringe as directed See admin instructions. Check blood sugar three times daily. Dx: E11.9  05/10/17   [name], DO  Lancets (ONETOUCH DELICA PLUS [id number]) [place] 10/30/19   [name], MD  lidocaine (LMX) 4 % cream Apply 1 application topically 3 (three) times daily as needed.  12/24/19   Nils Flack, Liana Camerer A, [name]  lisinopril (ZESTRIL) 10 MG tablet Take 1 tablet (10 mg total) by mouth at bedtime. 07/22/19   Larey Dresser, MD  nitroGLYCERIN (NITROSTAT) 0.4 MG SL tablet Place 1 tablet (0.4 mg total) under the tongue every 5 (five) minutes as needed for chest pain. Patient not taking: Reported on 12/10/2019 10/27/17   [name], DO  [facility] ULTRA test strip USE TWICE DAILY 10/30/19   [name], MD  polyethylene glycol (MIRALAX / GLYCOLAX) 17 g packet Take 17 g by mouth 2 (two) times daily. 06/30/19   Thurnell Lose, MD  senna (SENOKOT) 8.6 MG TABS tablet Take 1 tablet (8.6 mg total) by mouth daily as needed for mild constipation. 06/30/19   Thurnell Lose, MD  spironolactone (ALDACTONE) 25 MG tablet Take 1 tablet (25 mg total) by mouth daily. 09/04/19 12/03/19  Consuelo Pandy, [name]    Allergies    Patient has no known allergies.  Review of Systems   Review of Systems  Constitutional: Negative for chills and fever.  Respiratory: Negative for shortness of breath (chronic, unchanged).   Cardiovascular: Positive for chest pain. Negative for leg swelling.  Gastrointestinal: Negative for abdominal pain, nausea and vomiting.  All other  systems reviewed and are negative.   Physical Exam Updated Vital Signs BP (!) 144/80   Pulse 91   Temp 98.2 F (36.8 C) (Oral)   Resp 13   Ht 5' 8"  (1.727 m)   Wt 80 kg   SpO2 98%   BMI 26.82 kg/m   Physical Exam Vitals and nursing note reviewed.  Constitutional:      General: He is not in acute distress.    Appearance: He is well-developed.  HENT:     Head: Normocephalic and atraumatic.  Eyes:     General:        Right eye: No discharge.        Left eye: No discharge.     Conjunctiva/sclera: Conjunctivae normal.  Neck:     Vascular: No JVD.     Trachea: No tracheal deviation.     Comments: No midline cervical spine tenderness, bilateral paracervical muscle tenderness in the trapezius distribution noted. Cardiovascular:     Rate and Rhythm: Normal rate and regular rhythm.  Pulmonary:     Effort: Pulmonary effort is normal.  Chest:     Chest wall: No tenderness.     Comments: Diffuse anterior chest wall tenderness with no deformity, crepitus, ecchymosis or flail segment.  Also has some lateral and posterior chest wall tenderness. Abdominal:     General: There is no distension.  Skin:    Findings: No erythema.  Neurological:     Mental Status: He is alert.  Psychiatric:        Behavior: Behavior normal.     ED Results / Procedures / Treatments   Labs (all labs ordered are listed, but only abnormal results are displayed) Labs Reviewed  BASIC METABOLIC PANEL - Abnormal; Notable for the following components:      Result Value   Sodium 134 (*)    Glucose, Bld 246 (*)    Creatinine, Ser 1.53 (*)    GFR calc non Af Amer 49 (*)    GFR calc Af Amer 57 (*)    All other components within normal limits  CBC  PROTIME-INR  TROPONIN I (HIGH SENSITIVITY)  TROPONIN I (HIGH SENSITIVITY)    EKG EKG Interpretation  Date/Time:  Tuesday December 24 2019 19:31:37 EDT Ventricular Rate:  88 PR Interval:  170 QRS Duration: 88 QT Interval:  334 QTC Calculation: 404 R  Axis:   9 Text Interpretation: Normal sinus rhythm Cannot rule out Anterior infarct , age undetermined T wave abnormality, consider inferolateral ischemia Abnormal ECG No significant change since last tracing Confirmed by Lacretia Leigh ([id number]) on 12/25/2019 10:14:30 AM   Radiology DG Chest 2 View  Result Date: 12/24/2019 CLINICAL DATA:  Chest pain radiates to left arm. Shortness of breath. EXAM: CHEST - 2 VIEW COMPARISON:  06/25/2019 FINDINGS: The lungs are clear without focal pneumonia, edema, pneumothorax or pleural effusion. The cardiopericardial silhouette is within normal limits for size. The visualized bony structures of the thorax are intact. IMPRESSION: No active cardiopulmonary disease. Electronically Signed   By: Misty Stanley M.D.   On: 12/24/2019 20:16    Procedures Procedures (including critical care time)  Medications Ordered in ED Medications  acetaminophen (TYLENOL) tablet 1,000 mg (1,000 mg Oral Given 12/24/19 2239)    ED Course  I have reviewed the triage vital signs and the nursing notes.  Pertinent labs & imaging results that were available during my care of the patient were reviewed by me and considered in my medical decision making (see chart for details).    MDM Rules/Calculators/A&P                          Patient presenting for evaluation of sudden onset chest pains.  Initially began on the right side, began to radiate to the left and into the left upper extremity and upper back.  The patient is afebrile, vital signs are stable.  He is nontoxic in appearance.  His EKG shows T wave abnormalities that have been stable compared to prior EKGs, no acute ischemic abnormalities noted.  Chest x-ray shows no acute cardiopulmonary abnormalities.  Lab work reviewed and interpreted by myself shows no leukocytosis, no anemia, hyperglycemia at patient's baseline.  Mild elevation in creatinine but BUN is within normal limits and he appears well-hydrated.  Serial troponins are  negative and stable.  His pain is very much so reproducible on palpation suggesting likely musculoskeletal etiology.  He has had similar pain previously.  Doubt ACS/MI, PE, cardiac tamponade, esophageal rupture, pneumonia, pneumothorax at this time.  Abdomen soft and nontender.  Will discharge home with close PCP and cardiology follow-up, plan to treat for musculoskeletal pain with lidocaine cream, Tylenol.  Will give small amount of Flexeril as well, discussed appropriate use of medications and potential side effects.  Discussed strict ED return precautions. Patient verbalized understanding of and agreement with plan and is safe for discharge home at this time.   Final Clinical Impression(s) / ED Diagnoses Final diagnoses:  Atypical chest pain  Chest wall pain    Rx / DC Orders ED Discharge Orders         Ordered    acetaminophen (TYLENOL) 500 MG tablet  Every 6 hours PRN     Discontinue  Reprint     12/24/19 2313    cyclobenzaprine (FLEXERIL) 10 MG tablet  2 times daily PRN     Discontinue  Reprint     12/24/19 2313    lidocaine (LMX) 4 % cream  3 times daily PRN     Discontinue  Reprint     12/24/19 2313           Renita Papa, [name] 12/25/19 1509    Drenda Freeze,  MD 12/25/19 1519

## 2019-12-31 ENCOUNTER — Ambulatory Visit: Payer: Medicare Other | Admitting: Endocrinology

## 2019-12-31 DIAGNOSIS — Z0289 Encounter for other administrative examinations: Secondary | ICD-10-CM

## 2020-01-01 ENCOUNTER — Other Ambulatory Visit: Payer: Self-pay | Admitting: *Deleted

## 2020-01-01 NOTE — Patient Outreach (Signed)
[name]) Care Management  01/01/2020  Calvin [name] 1961/03/05 [id number]   Call placed to member to follow up on management of diabetes.  He report his blood sugars have remained elevated, today was 345. State he has been having blurred vision as well as headaches, state he has an appointment already scheduled with the eye doctor for next week.  Report he continue to have a decreased appetite, having a full sensation after taking just a few bites.  He has discussed this with PCP during previous visit, was placed on Remeron but wasn't able to continue regimen due to side effects.  He will discuss other options with PCP during next visit on 7/19.  State had a few bites of a bologna sandwich today, reminded of low sodium and diabetic diet.  He admits that he has been receiving the Mom's meals but state they don't have enough salt in them for him.  Advised that they are compliant with his recommended diet, he verbalizes understanding and state he will try to eat them.  Noted that he had Endocrinologist appointment scheduled for yesterday that he missed. He is aware, state he forgot about it.  He will call to reschedule.  Also has appointments with podiatrist on 7/16 and with cardiologist on 8/3.  Denies any urgent concerns at this time, will follow up within the next month.  [facility] CM Care Plan Problem One     Most Recent Value  Care Plan Problem One Knowledge deficit related to disease process and mgmt of Diabetes  Role Documenting the Problem One Care Management Telephonic Coordinator  Care Plan for Problem One Active  [facility] Long Term Goal  Patient will report a lowering/decrease in A1C level of 9 over the next 90 days.  [facility] Long Term Goal Start Date 10/22/19  Interventions for Problem One Long Term Goal Member re-educated on complications of uncontrolled diabetes, including heart, kidney, and eye damage  [facility] CM Short Term Goal #1  Member will report adhering to diabetic/low  sodium diet over the next 4 weeks  [facility] CM Short Term Goal #1 Start Date 01/01/20  Interventions for Short Term Goal #1 Appropriate foods for low sodium and low sugars reviewed with member  THN CM Short Term Goal #2  Member will have rescheduled appointment with endocrinologist within the next 4 weeks  THN CM Short Term Goal #2 Start Date 01/01/20  Interventions for Short Term Goal #2 Advised of missed appointment with endocrinologist, strongly encouraged to today to reschedule     [name], RN, MSN Bernice Manager [phone]

## 2020-01-07 ENCOUNTER — Telehealth: Payer: Self-pay

## 2020-01-07 NOTE — Telephone Encounter (Signed)
Received request to complete Rx renewal for [employer]. Unable to complete without an appt. Letter has been mailed advising pt to schedule an appt.

## 2020-01-13 ENCOUNTER — Ambulatory Visit: Payer: Medicare Other | Admitting: Endocrinology

## 2020-01-13 ENCOUNTER — Other Ambulatory Visit: Payer: Self-pay

## 2020-01-13 NOTE — Telephone Encounter (Signed)
According to documentation below, the paperwork cannot be filled out until after the patient has been seen.

## 2020-01-13 NOTE — Telephone Encounter (Signed)
Im not sure what this is. Can you help me on this one?   Thank you

## 2020-01-13 NOTE — Telephone Encounter (Signed)
Patient has rescheduled appointment for this Wednesday - patient called requesting renewal for [employer].

## 2020-01-13 NOTE — Telephone Encounter (Signed)
Ok thank you 

## 2020-01-15 ENCOUNTER — Other Ambulatory Visit: Payer: Self-pay

## 2020-01-15 ENCOUNTER — Ambulatory Visit (INDEPENDENT_AMBULATORY_CARE_PROVIDER_SITE_OTHER): Payer: Medicare Other | Admitting: Endocrinology

## 2020-01-15 ENCOUNTER — Encounter: Payer: Self-pay | Admitting: Endocrinology

## 2020-01-15 VITALS — BP 128/62 | HR 82 | Ht 68.0 in | Wt 162.0 lb

## 2020-01-15 DIAGNOSIS — Z794 Long term (current) use of insulin: Secondary | ICD-10-CM

## 2020-01-15 DIAGNOSIS — E1140 Type 2 diabetes mellitus with diabetic neuropathy, unspecified: Secondary | ICD-10-CM

## 2020-01-15 LAB — POCT GLYCOSYLATED HEMOGLOBIN (HGB A1C): Hemoglobin A1C: 10.1 % — AB (ref 4.0–5.6)

## 2020-01-15 MED ORDER — TRESIBA FLEXTOUCH 200 UNIT/ML ~~LOC~~ SOPN: 30.0000 [IU] | PEN_INJECTOR | Freq: Every day | SUBCUTANEOUS | 1 refills | Status: DC

## 2020-01-15 MED ORDER — TRESIBA FLEXTOUCH 200 UNIT/ML ~~LOC~~ SOPN: 30.0000 [IU] | PEN_INJECTOR | Freq: Every day | SUBCUTANEOUS | Status: DC

## 2020-01-15 NOTE — Patient Instructions (Addendum)
Please increase the Tresiba to 30 units daily.  On this type of insulin, you should eat meals on a regular schedule.  If a meal is missed or significantly delayed, your blood sugar could go low.   check your blood sugar twice a day.  vary the time of day when you check, between before the 3 meals, and at bedtime.  also check if you have symptoms of your blood sugar being too high or too low.  please keep a record of the readings and bring it to your next appointment here (or you can bring the meter itself).  You can write it on any piece of paper.  please call us sooner if your blood sugar goes below 70, or if you have a lot of readings over 200. Please come back for a follow-up appointment in 6 weeks.

## 2020-01-15 NOTE — Progress Notes (Signed)
Subjective:    Patient ID: Calvin [name], male    DOB: 19-Jul-1960, 59 y.o.   MRN: [id number]  HPI Pt returns for f/u of diabetes mellitus: DM type: Insulin-requiring type 2. Dx'ed: 3149 Complications: PN, CAD, CRI, and DR.   Therapy: insulin since soon after dx.  DKA: never Severe hypoglycemia: once, in 2021.   Pancreatitis: never Pancreatic imaging: normal on 2003 CT. SDOH: due to noncompliance, he is not a candidate for multiple daily injections; he is retired; he gets insulin from pt assistance; changes between brands must be minimized, due to LHL.  Other: up to early 2021, he was taking more than 200 units QD; pt requests that insulin be titrated up very slowly.    Interval history: no cbg record, but states cbg's are in the 300's.  He says he takes insulin as rx'ed.   Past Medical History:  Diagnosis Date  . Adenoidal hypertrophy   . Adenomatous colon polyps 2012  . Arthritis   . Diabetes mellitus   . GERD (gastroesophageal reflux disease)   . Heart attack ([place])    [place] living in [place]  [name] Hx of adenomatous colonic polyps 08/18/2017  . Hyperlipidemia   . Hypertension   . Mild CAD    a. cath in 08/2017 showing mild nonobstructive CAD with scattered 20-30% stenosis.   . Nonischemic cardiomyopathy ([place])    a. EF 20-25% by echo in 09/2017 with cath showing mild CAD. b.  Last echo 12/2017 EF 35-40%, grade 2 DD.  [name] Obesity   . PAF (paroxysmal atrial fibrillation) ([place])   . Sleep apnea    cpap, pt says no longer has    Past Surgical History:  Procedure Laterality Date  . COLONOSCOPY  05/13/11   9 adenomas  . FOREARM SURGERY    . MUSCLE BIOPSY    . RIGHT/LEFT HEART CATH AND CORONARY ANGIOGRAPHY N/A 08/25/2017   Procedure: RIGHT/LEFT HEART CATH AND CORONARY ANGIOGRAPHY;  Surgeon: [name], MD;  Location: [place] [facility];  Service: Cardiovascular;  Laterality: N/A;  . RIGHT/LEFT HEART CATH AND CORONARY ANGIOGRAPHY N/A 10/01/2019   Procedure: RIGHT/LEFT  HEART CATH AND CORONARY ANGIOGRAPHY;  Surgeon: [name], MD;  Location: [place] [facility];  Service: Cardiovascular;  Laterality: N/A;    Social History   Socioeconomic History  . Marital status: Single    Spouse name: Not on file  . Number of children: 0  . Years of education: Not on file  . Highest education level: Not on file  Occupational History  . Occupation: Disability  Tobacco Use  . Smoking status: Never Smoker  . Smokeless tobacco: Never Used  Vaping Use  . Vaping Use: Never used  Substance and Sexual Activity  . Alcohol use: No  . Drug use: No  . Sexual activity: Yes    Birth control/protection: None  Other Topics Concern  . Not on file  Social History Narrative   Social History   Diet?    Do you drink/eat things with caffeine? yes   Marital status?       single      Do you live in a house, apartment, assisted living, condo, trailer, etc.? yes   Is it one or more stories? One story   How many persons live in your home?   Do you have any pets in your home? (please list)    Highest level of education completed? graduate   Do you exercise?  no                          Type & how often?   Advanced Directives   Do you have a living will? no   Do you have a DNR form?                                  If not, do you want to discuss one? no   Do you have signed POA/HPOA for forms? no      Functional Status   Do you have difficulty bathing or dressing yourself? no   Do you have difficulty preparing food or eating? no   Do you have difficulty managing your medications? no   Do you have difficulty managing your finances? no   Do you have difficulty affording your medications? no   Social Determinants of Health   Financial Resource Strain:   . Difficulty of Paying Living Expenses:   Food Insecurity: No Food Insecurity  . Worried About Charity fundraiser in the Last Year: Never true  . Ran Out of Food in the Last Year: Never true   Transportation Needs: Unmet Transportation Needs  . Lack of Transportation (Medical): Yes  . Lack of Transportation (Non-Medical): Yes  Physical Activity:   . Days of Exercise per Week:   . Minutes of Exercise per Session:   Stress:   . Feeling of Stress :   Social Connections:   . Frequency of Communication with Friends and Family:   . Frequency of Social Gatherings with Friends and Family:   . Attends Religious Services:   . Active Member of Clubs or Organizations:   . Attends Archivist Meetings:   [name] Marital Status:   Intimate Partner Violence:   . Fear of Current or Ex-Partner:   . Emotionally Abused:   [name] Physically Abused:   . Sexually Abused:     Current Outpatient Medications on File Prior to Visit  Medication Sig Dispense Refill  . acetaminophen (TYLENOL) 500 MG tablet Take 1 tablet (500 mg total) by mouth every 6 (six) hours as needed. 30 tablet 0  . albuterol (VENTOLIN HFA) 108 (90 Base) MCG/ACT inhaler Inhale 2 puffs into the lungs every 6 (six) hours as needed for wheezing or shortness of breath. 6.7 g 0  . amitriptyline (ELAVIL) 10 MG tablet Take 2 tablets at bed time (Patient taking differently: Take 20 mg by mouth at bedtime. ) 180 tablet 3  . apixaban (ELIQUIS) 5 MG TABS tablet Take 1 tablet (5 mg total) by mouth 2 (two) times daily. 60 tablet 5  . carvedilol (COREG) 6.25 MG tablet Take 1 tablet (6.25 mg total) by mouth 2 (two) times daily with a meal. 180 tablet 3  . cyclobenzaprine (FLEXERIL) 10 MG tablet Take 1 tablet (10 mg total) by mouth 2 (two) times daily as needed for muscle spasms. 10 tablet 0  . digoxin (LANOXIN) 0.125 MG tablet Take 1 tablet (0.125 mg total) by mouth daily. 90 tablet 3  . donepezil (ARICEPT) 10 MG tablet Take 1 tablet (10 mg total) by mouth at bedtime. 90 tablet 3  . Evolocumab (REPATHA SURECLICK) 425 MG/ML SOAJ Inject 1 pen into the skin every 14 (fourteen) days. 6 pen 3  . ezetimibe (ZETIA) 10 MG tablet Take 1 tablet (10 mg  total) by mouth daily. 90 tablet 3  .  Insulin Syringe-Needle U-100 (INSULIN SYRINGE .3CC/29GX1/2") 29G X 1/2" 0.3 ML MISC Inject 1 Syringe 3 (three) times daily as directed. Check blood sugar three times daily. Dx: E11.9 (Patient taking differently: Inject 1 Syringe as directed See admin instructions. Check blood sugar three times daily. Dx: E11.9 ) 100 each 3  . Lancets (ONETOUCH DELICA PLUS [id number]) MISC USE TWICE DAILY 200 each 3  . lidocaine (LMX) 4 % cream Apply 1 application topically 3 (three) times daily as needed. 30 g 0  . lisinopril (ZESTRIL) 10 MG tablet Take 1 tablet (10 mg total) by mouth at bedtime. 90 tablet 3  . nitroGLYCERIN (NITROSTAT) 0.4 MG SL tablet Place 1 tablet (0.4 mg total) under the tongue every 5 (five) minutes as needed for chest pain. 30 tablet 1  . ONETOUCH ULTRA test strip USE TWICE DAILY 200 strip 3  . polyethylene glycol (MIRALAX / GLYCOLAX) 17 g packet Take 17 g by mouth 2 (two) times daily. 14 each 0  . senna (SENOKOT) 8.6 MG TABS tablet Take 1 tablet (8.6 mg total) by mouth daily as needed for mild constipation. 15 tablet 0  . spironolactone (ALDACTONE) 25 MG tablet Take 1 tablet (25 mg total) by mouth daily. 90 tablet 3   No current facility-administered medications on file prior to visit.    No Known Allergies  Family History  Problem Relation Age of Onset  . Heart disease Mother   . Heart attack Mother 3  . Hypertension Mother   . Hyperlipidemia Mother   . Diabetes Mother   . Heart disease Father   . Heart attack Father 29  . Hypertension Father   . Hyperlipidemia Father   . Diabetes Father   . Heart attack Sister 22  . Colon cancer Neg Hx   . Stomach cancer Neg Hx   . Esophageal cancer Neg Hx   . Rectal cancer Neg Hx     BP 128/62   Pulse 82   Ht 5' 8"  (1.727 m)   Wt 162 lb (73.5 kg)   SpO2 99%   BMI 24.63 kg/m    Review of Systems He denies hypoglycemia/n/v/sob    Objective:   Physical Exam VITAL SIGNS:  See vs page  GENERAL: no distress Pulses: dorsalis pedis intact bilat.   MSK: no deformity of the feet CV: no leg edema Skin:  no ulcer on the feet.  normal color and temp on the feet. Neuro: sensation is intact to touch on the feet  Lab Results  Component Value Date   HGBA1C 10.1 (A) 01/15/2020       Assessment & Plan:  Insulin-requiring type 2 DM, with CAD: worse  Patient Instructions  Please increase the Tresiba to 30 units daily.  On this type of insulin, you should eat meals on a regular schedule.  If a meal is missed or significantly delayed, your blood sugar could go low.   check your blood sugar twice a day.  vary the time of day when you check, between before the 3 meals, and at bedtime.  also check if you have symptoms of your blood sugar being too high or too low.  please keep a record of the readings and bring it to your next appointment here (or you can bring the meter itself).  You can write it on any piece of paper.  please call us sooner if your blood sugar goes below 70, or if you have a lot of readings over 200. Please come back for  a follow-up appointment in 6 weeks.   '

## 2020-01-17 ENCOUNTER — Other Ambulatory Visit: Payer: Self-pay

## 2020-01-17 ENCOUNTER — Encounter: Payer: Self-pay | Admitting: Podiatry

## 2020-01-17 ENCOUNTER — Ambulatory Visit: Payer: Medicare Other | Admitting: Podiatry

## 2020-01-17 DIAGNOSIS — B351 Tinea unguium: Secondary | ICD-10-CM | POA: Diagnosis not present

## 2020-01-17 DIAGNOSIS — L853 Xerosis cutis: Secondary | ICD-10-CM

## 2020-01-17 DIAGNOSIS — M79676 Pain in unspecified toe(s): Secondary | ICD-10-CM | POA: Diagnosis not present

## 2020-01-17 DIAGNOSIS — E1142 Type 2 diabetes mellitus with diabetic polyneuropathy: Secondary | ICD-10-CM

## 2020-01-20 ENCOUNTER — Other Ambulatory Visit: Payer: Self-pay

## 2020-01-20 ENCOUNTER — Ambulatory Visit (INDEPENDENT_AMBULATORY_CARE_PROVIDER_SITE_OTHER): Payer: Medicare Other | Admitting: Nurse Practitioner

## 2020-01-20 ENCOUNTER — Encounter (HOSPITAL_COMMUNITY): Payer: Self-pay | Admitting: Emergency Medicine

## 2020-01-20 ENCOUNTER — Encounter: Payer: Self-pay | Admitting: Nurse Practitioner

## 2020-01-20 ENCOUNTER — Emergency Department (HOSPITAL_COMMUNITY)
Admission: EM | Admit: 2020-01-20 | Discharge: 2020-01-20 | Disposition: A | Discharge status: Home / Self Care | Payer: Medicare Other | Attending: Emergency Medicine | Admitting: Emergency Medicine

## 2020-01-20 ENCOUNTER — Emergency Department (HOSPITAL_COMMUNITY): Payer: Medicare Other

## 2020-01-20 VITALS — BP 114/70 | HR 84 | Temp 96.8°F | Ht 68.0 in | Wt 164.8 lb

## 2020-01-20 DIAGNOSIS — Z7901 Long term (current) use of anticoagulants: Secondary | ICD-10-CM | POA: Insufficient documentation

## 2020-01-20 DIAGNOSIS — R079 Chest pain, unspecified: Secondary | ICD-10-CM

## 2020-01-20 DIAGNOSIS — Z79899 Other long term (current) drug therapy: Secondary | ICD-10-CM | POA: Diagnosis not present

## 2020-01-20 DIAGNOSIS — I2510 Atherosclerotic heart disease of native coronary artery without angina pectoris: Secondary | ICD-10-CM | POA: Diagnosis not present

## 2020-01-20 DIAGNOSIS — R0602 Shortness of breath: Secondary | ICD-10-CM | POA: Diagnosis not present

## 2020-01-20 DIAGNOSIS — R0789 Other chest pain: Secondary | ICD-10-CM | POA: Diagnosis not present

## 2020-01-20 DIAGNOSIS — I509 Heart failure, unspecified: Secondary | ICD-10-CM | POA: Insufficient documentation

## 2020-01-20 DIAGNOSIS — Z794 Long term (current) use of insulin: Secondary | ICD-10-CM | POA: Diagnosis not present

## 2020-01-20 DIAGNOSIS — E1140 Type 2 diabetes mellitus with diabetic neuropathy, unspecified: Secondary | ICD-10-CM | POA: Diagnosis not present

## 2020-01-20 DIAGNOSIS — E1165 Type 2 diabetes mellitus with hyperglycemia: Secondary | ICD-10-CM | POA: Diagnosis not present

## 2020-01-20 DIAGNOSIS — I110 Hypertensive heart disease with heart failure: Secondary | ICD-10-CM | POA: Insufficient documentation

## 2020-01-20 DIAGNOSIS — R42 Dizziness and giddiness: Secondary | ICD-10-CM | POA: Diagnosis not present

## 2020-01-20 LAB — CBC WITH DIFFERENTIAL/PLATELET
Abs Immature Granulocytes: 0.01 10*3/uL (ref 0.00–0.07)
Basophils Absolute: 0 10*3/uL (ref 0.0–0.1)
Basophils Relative: 1 %
Eosinophils Absolute: 0.2 10*3/uL (ref 0.0–0.5)
Eosinophils Relative: 5 %
HCT: 40.2 % (ref 39.0–52.0)
Hemoglobin: 13.3 g/dL (ref 13.0–17.0)
Immature Granulocytes: 0 %
Lymphocytes Relative: 34 %
Lymphs Abs: 1.8 10*3/uL (ref 0.7–4.0)
MCH: 30.4 pg (ref 26.0–34.0)
MCHC: 33.1 g/dL (ref 30.0–36.0)
MCV: 91.8 fL (ref 80.0–100.0)
Monocytes Absolute: 0.5 10*3/uL (ref 0.1–1.0)
Monocytes Relative: 9 %
Neutro Abs: 2.7 10*3/uL (ref 1.7–7.7)
Neutrophils Relative %: 51 %
Platelets: 228 10*3/uL (ref 150–400)
RBC: 4.38 MIL/uL (ref 4.22–5.81)
RDW: 12.5 % (ref 11.5–15.5)
WBC: 5.2 10*3/uL (ref 4.0–10.5)
nRBC: 0 % (ref 0.0–0.2)

## 2020-01-20 LAB — COMPREHENSIVE METABOLIC PANEL
ALT: 15 U/L (ref 0–44)
AST: 18 U/L (ref 15–41)
Albumin: 3.2 g/dL — ABNORMAL LOW (ref 3.5–5.0)
Alkaline Phosphatase: 64 U/L (ref 38–126)
Anion gap: 10 (ref 5–15)
BUN: 11 mg/dL (ref 6–20)
CO2: 22 mmol/L (ref 22–32)
Calcium: 8.7 mg/dL — ABNORMAL LOW (ref 8.9–10.3)
Chloride: 108 mmol/L (ref 98–111)
Creatinine, Ser: 1.26 mg/dL — ABNORMAL HIGH (ref 0.61–1.24)
GFR calc Af Amer: >60 mL/min (ref >60)
GFR calc non Af Amer: >60 mL/min (ref >60)
Glucose, Bld: 228 mg/dL — ABNORMAL HIGH (ref 70–99)
Potassium: 4.1 mmol/L (ref 3.5–5.1)
Sodium: 140 mmol/L (ref 135–145)
Total Bilirubin: 0.5 mg/dL (ref 0.3–1.2)
Total Protein: 5.8 g/dL — ABNORMAL LOW (ref 6.5–8.1)

## 2020-01-20 LAB — TROPONIN I (HIGH SENSITIVITY)
Troponin I (High Sensitivity): 12 ng/L (ref <18)
Troponin I (High Sensitivity): 17 ng/L (ref <18)

## 2020-01-20 MED ORDER — NITROGLYCERIN 0.4 MG SL SUBL
0.4000 mg | SUBLINGUAL_TABLET | SUBLINGUAL | Status: DC | PRN
  Administered 2020-01-20 (×2): 0.4 mg via SUBLINGUAL
  Filled 2020-01-20 (×2): qty 1

## 2020-01-20 NOTE — ED Triage Notes (Signed)
Pt BIB [facility] from Dr. office. Pt reports sudden onset chest pain as he was walking into office. Pt reports pressure to center of his chest. The pressure does not radiate anywhere. VSS. NAD.

## 2020-01-20 NOTE — ED Notes (Signed)
Pt discharge instructions and follow-up care reviewed with the patient. The patient verbalized understanding of instructions. Pt discharged.

## 2020-01-20 NOTE — Progress Notes (Signed)
Careteam: [name], NP as PCP - General (Geriatric Medicine) [name], MD as PCP - Cardiology (Cardiology) [name], [name], MD as PCP - Advanced Heart Failure (Cardiology) [name], MD as Consulting Physician (Endocrinology) [name], RN as [place] Management  PLACE OF SERVICE:  [place] Directive information Does Patient Have a Medical Advance Directive?: No, Does patient want to make changes to medical advance directive?: Yes (MAU/Ambulatory/Procedural Areas - Information given)  No Known Allergies  Chief Complaint  Patient presents with  . Medical Management of Chronic Issues    3 month follow-up   . Ear Problem    Bilateral ear ache   . Fatigue    Patient wakes up tired and feels light-headed  . Chest Pain    Patient c/o chest pain in the center of chest that onset this morning, patient thinks this is related to indigestion.   . Medication Management    Out of repatha x 1 week or longer   . Medication Refill    Refill request for lidocaine cream     HPI: Patient is a 59 y.o. male for routine follow up. Pt with hx of CHF, CAD and today reports a pain in the center of his chest that started when he came into the office. Has not had a pain like this before.  Rubbing the middle of his chest. Feels like something is sitting on chest. Heavy feeling. Pain 8/10.  States could be indigestion but has no hx of this and never felt before.  Reports he is now having a hard time breathing. States he is feeling lightheaded and weak.  No radiation of pain.  No cough or congestion.  No swelling to LE Hx of uncontrolled diabetes, blood sugar last night was 320 did not take blood sugar this morning.     Review of Systems:  Review of Systems  Constitutional: Positive for malaise/fatigue. Negative for chills and fever.  Respiratory: Positive for shortness of breath. Negative for cough,  hemoptysis and wheezing.   Cardiovascular: Positive for chest pain and palpitations. Negative for leg swelling.  Neurological: Positive for dizziness. Negative for weakness and headaches.       Lighheaded    Past Medical History:  Diagnosis Date  . Adenoidal hypertrophy   . Adenomatous colon polyps 2012  . Arthritis   . Diabetes mellitus   . GERD (gastroesophageal reflux disease)   . Heart attack ([place])    [place]  [name] Hx of adenomatous colonic polyps 08/18/2017  . Hyperlipidemia   . Hypertension   . Mild CAD    a. cath in 08/2017 showing mild nonobstructive CAD with scattered 20-30% stenosis.   . Nonischemic cardiomyopathy ([place])    a. EF 20-25% by echo in 09/2017 with cath showing mild CAD. b.  Last echo 12/2017 EF 35-40%, grade 2 DD.  [name] Obesity   . PAF (paroxysmal atrial fibrillation) ([place])   . Sleep apnea    cpap, pt says no longer has   Past Surgical History:  Procedure Laterality Date  . COLONOSCOPY  05/13/11   9 adenomas  . FOREARM SURGERY    . MUSCLE BIOPSY    . RIGHT/LEFT HEART CATH AND CORONARY ANGIOGRAPHY N/A 08/25/2017   Procedure: RIGHT/LEFT HEART CATH AND CORONARY ANGIOGRAPHY;  Surgeon: [name], MD;  Location: [facility] [facility];  Service: Cardiovascular;  Laterality: N/A;  . RIGHT/LEFT HEART CATH AND CORONARY  ANGIOGRAPHY N/A 10/01/2019   Procedure: RIGHT/LEFT HEART CATH AND CORONARY ANGIOGRAPHY;  Surgeon: [name], MD;  Location: [facility];  Service: Cardiovascular;  Laterality: N/A;   Social History:   reports that he has never smoked. He has never used smokeless tobacco. He reports that he does not drink alcohol and does not use drugs.  Family History  Problem Relation Age of Onset  . Heart disease Mother   . Heart attack Mother 34  . Hypertension Mother   . Hyperlipidemia Mother   . Diabetes Mother   . Heart disease Father   . Heart attack Father 85  . Hypertension Father   . Hyperlipidemia Father   .  Diabetes Father   . Heart attack Sister 50  . Colon cancer Neg Hx   . Stomach cancer Neg Hx   . Esophageal cancer Neg Hx   . Rectal cancer Neg Hx     Medications: Patient's Medications  New Prescriptions   No medications on file  Previous Medications   ACETAMINOPHEN (TYLENOL) 500 MG TABLET    Take 1 tablet (500 mg total) by mouth every 6 (six) hours as needed.   ALBUTEROL (VENTOLIN HFA) 108 (90 BASE) MCG/ACT INHALER    Inhale 2 puffs into the lungs every 6 (six) hours as needed for wheezing or shortness of breath.   AMITRIPTYLINE (ELAVIL) 10 MG TABLET    Take 2 tablets at bed time   APIXABAN (ELIQUIS) 5 MG TABS TABLET    Take 1 tablet (5 mg total) by mouth 2 (two) times daily.   CARVEDILOL (COREG) 6.25 MG TABLET    Take 1 tablet (6.25 mg total) by mouth 2 (two) times daily with a meal.   CYCLOBENZAPRINE (FLEXERIL) 10 MG TABLET    Take 1 tablet (10 mg total) by mouth 2 (two) times daily as needed for muscle spasms.   DIGOXIN (LANOXIN) 0.125 MG TABLET    Take 1 tablet (0.125 mg total) by mouth daily.   DONEPEZIL (ARICEPT) 10 MG TABLET    Take 1 tablet (10 mg total) by mouth at bedtime.   EVOLOCUMAB (REPATHA SURECLICK) 937 MG/ML SOAJ    Inject 1 pen into the skin every 14 (fourteen) days.   EZETIMIBE (ZETIA) 10 MG TABLET    Take 1 tablet (10 mg total) by mouth daily.   INSULIN DEGLUDEC (TRESIBA FLEXTOUCH) 200 UNIT/ML FLEXTOUCH PEN    Inject 30 Units into the skin daily.   INSULIN SYRINGE-NEEDLE U-100 (INSULIN SYRINGE .3CC/29GX1/2") 29G X 1/2" 0.3 ML MISC    Inject 1 Syringe 3 (three) times daily as directed. Check blood sugar three times daily. Dx: E11.9   LANCETS (ONETOUCH DELICA PLUS [id number]) MISC    USE TWICE DAILY   LIDOCAINE (LMX) 4 % CREAM    Apply 1 application topically 3 (three) times daily as needed.   LISINOPRIL (ZESTRIL) 10 MG TABLET    Take 1 tablet (10 mg total) by mouth at bedtime.   NITROGLYCERIN (NITROSTAT) 0.4 MG SL TABLET    Place 1 tablet (0.4 mg total) under the tongue  every 5 (five) minutes as needed for chest pain.   ONETOUCH ULTRA TEST STRIP    USE TWICE DAILY   POLYETHYLENE GLYCOL (MIRALAX / GLYCOLAX) 17 G PACKET    Take 17 g by mouth 2 (two) times daily.   SENNA (SENOKOT) 8.6 MG TABS TABLET    Take 1 tablet (8.6 mg total) by mouth daily as needed for mild constipation.  Modified Medications   No medications on file  Discontinued Medications   SPIRONOLACTONE (ALDACTONE) 25 MG TABLET    Take 1 tablet (25 mg total) by mouth daily.    Physical Exam:  Vitals:   01/20/20 0935  BP: 114/70  Pulse: 84  Temp: (!) 96.8 F (36 C)  TempSrc: Temporal  SpO2: 99%  Weight: 164 lb 12.8 oz (74.8 kg)  Height: 5' 8"  (1.727 m)   Body mass index is 25.06 kg/m. Wt Readings from Last 3 Encounters:  01/20/20 164 lb 12.8 oz (74.8 kg)  01/15/20 162 lb (73.5 kg)  12/24/19 176 lb 5.9 oz (80 kg)    Physical Exam Constitutional:      General: He is not in acute distress.    Appearance: He is well-developed. He is not diaphoretic.     Comments: appears fatigued   HENT:     Head: Normocephalic and atraumatic.     Mouth/Throat:     Pharynx: No oropharyngeal exudate.  Eyes:     Conjunctiva/sclera: Conjunctivae normal.     Pupils: Pupils are equal, round, and reactive to light.  Cardiovascular:     Rate and Rhythm: Normal rate and regular rhythm.  No extrasystoles are present.    Heart sounds: Normal heart sounds.  Pulmonary:     Effort: Pulmonary effort is normal.     Breath sounds: Normal breath sounds.  Abdominal:     General: Bowel sounds are normal.     Palpations: Abdomen is soft.  Musculoskeletal:        General: No tenderness.     Cervical back: Normal range of motion and neck supple.  Skin:    General: Skin is warm.  Neurological:     Mental Status: He is alert and oriented to person, place, and time.  Psychiatric:        Mood and Affect: Mood normal.     Labs reviewed: Basic Metabolic Panel: Recent Labs    06/17/19 1240 06/17/19 1300  06/17/19 1643 06/17/19 1650 06/28/19 1240 06/28/19 1819 06/29/19 0250 07/13/19 2042 10/16/19 0403 10/17/19 0419 10/21/19 1618 10/25/19 1014 12/24/19 1940  NA 133*  --   --    < >  --    < > 134*   < > 138   < > 138 137 134*  K 4.0  --   --    < >  --    < > 4.7   < > 3.9   < > 4.9 4.5 4.1  CL 97*  --   --    < >  --    < > 96*   < > 103   < > 100 101 102  CO2 22  --   --    < >  --    < > 25   < > 26   < > 29 27 25   GLUCOSE 254*  --   --    < >  --    < > 143*   < > 146*   < > 162* 247* 246*  BUN 21*  --   --    < >  --    < > 26*   < > 8   < > 17 21* 17  CREATININE 1.65*  --   --    < >  --    < > 1.00   < > 1.05   < > 1.19 1.24 1.53*  CALCIUM 8.6*  --   --    < >  --    < >  8.6*   < > 8.7*   < > 9.7 9.1 9.2  MG 2.4  --   --    < > 2.6*  --  2.0  --  1.9  --   --   --   --   PHOS 3.1  --   --   --   --   --   --   --   --   --   --   --   --   TSH  --  1.854 2.428  --   --   --   --   --   --   --  1.72  --   --    < > = values in this interval not displayed.   Liver Function Tests: Recent Labs    06/27/19 0205 06/27/19 0205 06/29/19 0250 08/28/19 0943 10/13/19 2202  AST 29   < > 23 12 28   ALT 23   < > 23 12 28   ALKPHOS 63  --  54  --  58  BILITOT 0.5   < > 0.4 0.4 0.4  PROT 6.3*   < > 5.6* 5.8* 6.2*  ALBUMIN 2.7*  --  2.6*  --  3.5   < > = values in this interval not displayed.   Recent Labs    01/23/19 1500  LIPASE 31   No results for input(s): AMMONIA in the last 8760 hours. CBC: Recent Labs    08/28/19 0943 09/27/19 1527 10/13/19 2202 10/13/19 2211 10/16/19 0403 10/21/19 1618 12/24/19 1940  WBC 4.6   < > 4.7   < > 7.8 7.2 5.6  NEUTROABS 2,130  --  3.6  --   --  3,982  --   HGB 13.2   < > 14.7   < > 14.0 15.5 14.2  HCT 38.9   < > 43.6   < > 41.3 46.3 42.1  MCV 92.0   < > 93.0   < > 90.8 91.5 89.4  PLT 232   < > 203   < > 252 338 251   < > = values in this interval not displayed.   Lipid Panel: Recent Labs    04/08/19 0801  CHOL 152  HDL 57    LDLCALC 81  TRIG 70  CHOLHDL 2.7   TSH: Recent Labs    06/17/19 1300 06/17/19 1643 10/21/19 1618  TSH 1.854 2.428 1.72   A1C: Lab Results  Component Value Date   HGBA1C 10.1 (A) 01/15/2020     Assessment/Plan 1. Chest pain, unspecified type -comes into office compling of 8/10 heavy chest pain, reports new shortness of breath. Pt reports increase in weakness, fatigue and dizziness. Pt agreeable to going to hospital.  - EKG 12-Lead completed in office and appears stable from previous and VSS however due to symptoms and history EMS called to transport to ED for for further evaluation.  Carlos American. [place], [facility] [phone]

## 2020-01-20 NOTE — ED Provider Notes (Signed)
[facility] EMERGENCY DEPARTMENT Provider Note   CSN: [id number] Arrival date & time: 01/20/20  1037     History Chief Complaint  Patient presents with  . Chest Pain    Calvin [name] is a 59 y.o. male.  59yo M w/ PMH including A fib on anticoagulation, HTN, HLD, T2DM who p/w chest pain. This morning, he was walking into the clinic for a routine PCP visit when he began having central, non-radiating chest pressure associated w/ SOB and dizziness. No N/V. He has never had this pain before. His pain is currently 8/10 in intensity. He denies any leg swelling, cough/cold symptoms, recent illness. He is compliant w/ medications. No recent problems with exertional activities and denies any recent episodes of chest pain. No tobacco, alcohol, or drug use. He received ASA by EMS prior to arrival.   The history is provided by the patient.  Chest Pain      Past Medical History:  Diagnosis Date  . Adenoidal hypertrophy   . Adenomatous colon polyps 2012  . Arthritis   . Diabetes mellitus   . GERD (gastroesophageal reflux disease)   . Heart attack ([place])    [place] living in [place]  [name] Hx of adenomatous colonic polyps 08/18/2017  . Hyperlipidemia   . Hypertension   . Mild CAD    a. cath in 08/2017 showing mild nonobstructive CAD with scattered 20-30% stenosis.   . Nonischemic cardiomyopathy ([place])    a. EF 20-25% by echo in 09/2017 with cath showing mild CAD. b.  Last echo 12/2017 EF 35-40%, grade 2 DD.  [name] Obesity   . PAF (paroxysmal atrial fibrillation) ([place])   . Sleep apnea    cpap, pt says no longer has    Patient Active Problem List   Diagnosis Date Noted  . Hypoglycemia due to insulin 10/15/2019  . Chronic anticoagulation 10/15/2019  . COVID-19 virus detected 06/18/2019  . Right sided weakness 06/17/2019  . AKI (acute kidney injury) ([place]) 06/17/2019  . Foot pain, bilateral 02/01/2019  . Abdominal pain 01/23/2019  . Chest pain 01/23/2019  . Right leg pain  12/20/2018  . Overgrown toenails 12/20/2018  . Unsteady gait 12/20/2018  . Encounter for therapeutic drug monitoring 10/22/2018  . Nausea and vomiting 03/10/2018  . AF (paroxysmal atrial fibrillation) ([place]) 03/10/2018  . Non-cardiac chest pain 03/09/2018  . Diabetic neuropathy ([place]) 09/26/2017  . MCI (mild cognitive impairment) 06/23/2017  . OSA on CPAP 05/10/2017  . Gastroesophageal reflux disease 05/10/2017  . Insomnia 05/10/2017  . Onychomycosis of multiple toenails with type 2 diabetes mellitus and peripheral neuropathy ([place]) 05/10/2017  . Chronic systolic heart failure ([place]) 11/22/2015  . Essential hypertension 11/22/2015  . DM (diabetes mellitus) ([place]) 11/20/2015  . HLD (hyperlipidemia) 11/20/2015  . Hx of adenomatous colonic polyps 05/19/2011    Past Surgical History:  Procedure Laterality Date  . COLONOSCOPY  05/13/11   9 adenomas  . FOREARM SURGERY    . MUSCLE BIOPSY    . RIGHT/LEFT HEART CATH AND CORONARY ANGIOGRAPHY N/A 08/25/2017   Procedure: RIGHT/LEFT HEART CATH AND CORONARY ANGIOGRAPHY;  Surgeon: [name], MD;  Location: [facility];  Service: Cardiovascular;  Laterality: N/A;  . RIGHT/LEFT HEART CATH AND CORONARY ANGIOGRAPHY N/A 10/01/2019   Procedure: RIGHT/LEFT HEART CATH AND CORONARY ANGIOGRAPHY;  Surgeon: [name], MD;  Location: [facility];  Service: Cardiovascular;  Laterality: N/A;       Family History  Problem Relation Age of Onset  .  Heart disease Mother   . Heart attack Mother 57  . Hypertension Mother   . Hyperlipidemia Mother   . Diabetes Mother   . Heart disease Father   . Heart attack Father 36  . Hypertension Father   . Hyperlipidemia Father   . Diabetes Father   . Heart attack Sister 16  . Colon cancer Neg Hx   . Stomach cancer Neg Hx   . Esophageal cancer Neg Hx   . Rectal cancer Neg Hx     Social History   Tobacco Use  . Smoking status: Never Smoker  . Smokeless tobacco: Never Used    Vaping Use  . Vaping Use: Never used  Substance Use Topics  . Alcohol use: No  . Drug use: No    Home Medications Prior to Admission medications   Medication Sig Start Date End Date Taking? Authorizing Provider  acetaminophen (TYLENOL) 500 MG tablet Take 1 tablet (500 mg total) by mouth every 6 (six) hours as needed. 12/24/19   Fawze, Mina A, [name]  albuterol (VENTOLIN HFA) 108 (90 Base) MCG/ACT inhaler Inhale 2 puffs into the lungs every 6 (six) hours as needed for wheezing or shortness of breath. 06/30/19   Thurnell Lose, MD  amitriptyline (ELAVIL) 10 MG tablet Take 2 tablets at bed time 08/27/19   Cameron Sprang, MD  apixaban (ELIQUIS) 5 MG TABS tablet Take 1 tablet (5 mg total) by mouth 2 (two) times daily. 11/05/19   Larey Dresser, MD  carvedilol (COREG) 6.25 MG tablet Take 1 tablet (6.25 mg total) by mouth 2 (two) times daily with a meal. 07/22/19   Larey Dresser, MD  cyclobenzaprine (FLEXERIL) 10 MG tablet Take 1 tablet (10 mg total) by mouth 2 (two) times daily as needed for muscle spasms. 12/24/19   Rodell Perna A, [name]  digoxin (LANOXIN) 0.125 MG tablet Take 1 tablet (0.125 mg total) by mouth daily. 08/20/19   Lyda Jester M, [name]  donepezil (ARICEPT) 10 MG tablet Take 1 tablet (10 mg total) by mouth at bedtime. 08/27/19   Cameron Sprang, MD  Evolocumab (REPATHA SURECLICK) 623 MG/ML SOAJ Inject 1 pen into the skin every 14 (fourteen) days. Patient not taking: Reported on 01/20/2020 07/22/19   [name], MD  ezetimibe (ZETIA) 10 MG tablet Take 1 tablet (10 mg total) by mouth daily. 07/22/19   [name], MD  insulin degludec (TRESIBA FLEXTOUCH) 200 UNIT/ML FlexTouch Pen Inject 30 Units into the skin daily. 01/15/20   [name], MD  Insulin Syringe-Needle U-100 (INSULIN SYRINGE .3CC/29GX1/2") 29G X 1/2" 0.3 ML MISC Inject 1 Syringe 3 (three) times daily as directed. Check blood sugar three times daily. Dx: E11.9 Patient taking differently: Inject 1  Syringe as directed See admin instructions. Check blood sugar three times daily. Dx: E11.9  05/10/17   [name], DO  Lancets (ONETOUCH DELICA PLUS [id number]) [place] 10/30/19   [name], MD  lidocaine (LMX) 4 % cream Apply 1 application topically 3 (three) times daily as needed. Patient not taking: Reported on 01/20/2020 12/24/19   Rodell Perna A, [name]  lisinopril (ZESTRIL) 10 MG tablet Take 1 tablet (10 mg total) by mouth at bedtime. 07/22/19   Larey Dresser, MD  nitroGLYCERIN (NITROSTAT) 0.4 MG SL tablet Place 1 tablet (0.4 mg total) under the tongue every 5 (five) minutes as needed for chest pain. 10/27/17   [name], DO  [facility] ULTRA test strip USE TWICE DAILY 10/30/19  [name], MD  polyethylene glycol (MIRALAX / GLYCOLAX) 17 g packet Take 17 g by mouth 2 (two) times daily. 06/30/19   Thurnell Lose, MD  senna (SENOKOT) 8.6 MG TABS tablet Take 1 tablet (8.6 mg total) by mouth daily as needed for mild constipation. 06/30/19   Thurnell Lose, MD    Allergies    Patient has no known allergies.  Review of Systems   Review of Systems  Cardiovascular: Positive for chest pain.   All other systems reviewed and are negative except that which was mentioned in HPI  Physical Exam Updated Vital Signs BP (!) 147/66 (BP Location: Left Arm)   Pulse 76   Temp 97.9 F (36.6 C) (Oral)   Resp 16   Ht 5' 8"  (1.727 m)   Wt 74.8 kg   SpO2 100%   BMI 25.07 kg/m   Physical Exam Vitals and nursing note reviewed.  Constitutional:      General: He is not in acute distress.    Appearance: He is well-developed.  HENT:     Head: Normocephalic and atraumatic.  Eyes:     Conjunctiva/sclera: Conjunctivae normal.  Cardiovascular:     Rate and Rhythm: Normal rate and regular rhythm.     Heart sounds: Normal heart sounds. No murmur heard.   Pulmonary:     Effort: Pulmonary effort is normal.     Breath sounds: Normal breath sounds.  Abdominal:     General:  Bowel sounds are normal. There is no distension.     Palpations: Abdomen is soft.     Tenderness: There is no abdominal tenderness.  Musculoskeletal:     Cervical back: Neck supple.     Right lower leg: No edema.     Left lower leg: No edema.  Skin:    General: Skin is warm and dry.  Neurological:     Mental Status: He is alert and oriented to person, place, and time.     Comments: Fluent speech  Psychiatric:        Judgment: Judgment normal.     ED Results / Procedures / Treatments   Labs (all labs ordered are listed, but only abnormal results are displayed) Labs Reviewed  COMPREHENSIVE METABOLIC PANEL - Abnormal; Notable for the following components:      Result Value   Glucose, Bld 228 (*)    Creatinine, Ser 1.26 (*)    Calcium 8.7 (*)    Total Protein 5.8 (*)    Albumin 3.2 (*)    All other components within normal limits  CBC WITH DIFFERENTIAL/PLATELET  TROPONIN I (HIGH SENSITIVITY)  TROPONIN I (HIGH SENSITIVITY)    EKG EKG Interpretation  Date/Time:  Monday January 20 2020 10:47:52 EDT Ventricular Rate:  73 PR Interval:  206 QRS Duration: 88 QT Interval:  372 QTC Calculation: 409 R Axis:   -22 Text Interpretation: Normal sinus rhythm Minimal voltage criteria for LVH, may be normal variant ( R in aVL ) Nonspecific T wave abnormality Abnormal ECG previous T wave inversions are less prominent, overall similar to previous Confirmed by Theotis Burrow [id number]) on 01/20/2020 10:51:52 AM   Radiology DG Chest 2 View  Result Date: 01/20/2020 CLINICAL DATA:  Central chest pain with SOB starting about 2 hours ago. EXAM: CHEST - 2 VIEW COMPARISON:  Chest radiograph 12/24/2019 FINDINGS: The heart size and mediastinal contours are within normal limits. The lungs are clear. No pneumothorax or pleural effusion. The visualized skeletal structures are unremarkable. IMPRESSION: No acute  cardiopulmonary process. Electronically Signed   By: Audie Pinto M.D.   On: 01/20/2020 11:31      Procedures Procedures (including critical care time)  Medications Ordered in ED Medications  nitroGLYCERIN (NITROSTAT) SL tablet 0.4 mg (0.4 mg Sublingual Given 01/20/20 1145)    ED Course  I have reviewed the triage vital signs and the nursing notes.  Pertinent labs & imaging results that were available during my care of the patient were reviewed by me and considered in my medical decision making (see chart for details).    MDM Rules/Calculators/A&P                          Well appearing on arrival, EKG without ischemic changes. Is complaint w/ anticoagulation making PE very unlikely. No ripping/tearing pain or radiating pain to suggest dissection. Gave NTG and obtained above labs.  Negative serial trops and stable Cr at 1.26 today. Pt reports feeling much better on reassessment. Chart review shows heart cath a few months ago. Given negative trops, resolution of pain, and reassuring EKG, I do not feel he needs emergent cardiology eval but have recommended he contact clinic for f/u appointment. Extensively reviewed return precautions and he voiced understanding.  Final Clinical Impression(s) / ED Diagnoses Final diagnoses:  Atypical chest pain    Rx / DC Orders ED Discharge Orders    None       Kaizer Dissinger, Wenda Overland, MD 01/20/20 1417

## 2020-01-21 ENCOUNTER — Telehealth (HOSPITAL_COMMUNITY): Payer: Self-pay | Admitting: *Deleted

## 2020-01-21 ENCOUNTER — Encounter: Payer: Self-pay | Admitting: Podiatry

## 2020-01-21 ENCOUNTER — Other Ambulatory Visit (HOSPITAL_COMMUNITY): Payer: Self-pay

## 2020-01-21 NOTE — Telephone Encounter (Signed)
Katie w/paramedicine messaged me to inform me spironolactone was on patients medication list until yesterday during his visit with pcp. Not sure if he was taken off medication due to weakness, fatigue, and dizziness (pts complaints during yesterdays office visit with PCP). Pt presented with chest pain during office visit and was sent by EMS to the ED for evaluation. Joellen Jersey is seeing pt today and wanted to know if spironolactone should be added back to patients meds (see note below)   "I am seeing patient today and was reviewing his provider notes from yesterday and his ED visit--ii noticed his PCP took him off spiro. I couldn't find a reason why she had taken him off but providers in clinic put him on it.  Could you review and perhaps let provider in clinic aware to see if they are ok with this med change?   Thanks!  Marylouise Stacks, EMT-Paramedic  01/21/20 "   Routed to Ann Maki for advice

## 2020-01-21 NOTE — Progress Notes (Signed)
  Subjective:  Patient ID: Calvin [name], male    DOB: June 13, 1961,  MRN: [id number]  Chief Complaint  Patient presents with  . Nail Problem    trim   59 y.o. male returns for the above complaint.  Patient presents with thickened elongated dystrophic toenails x10.  No pain on palpation.  He states it hurts when ambulating.  He is diabetic with last A1c of 9.3.  He is also on Eliquis.  He would like to have them debrided down.  He has secondary complaint of bilateral dry skin without itching.  He has been using over-the-counter lotion sporadically.  I encouraged him to use it more often.  Patient states understanding.  Objective:   There were no vitals filed for this visit. Podiatric Exam: Vascular: dorsalis pedis and posterior tibial pulses are palpable bilateral. Capillary return is immediate. Temperature gradient is WNL. Skin turgor WNL  Sensorium: Normal Semmes Weinstein monofilament test. Normal tactile sensation bilaterally. Nail Exam: Pt has thick disfigured discolored nails with subungual debris noted bilateral entire nail hallux through fifth toenails Ulcer Exam: There is no evidence of ulcer or pre-ulcerative changes or infection. Orthopedic Exam: Muscle tone and strength are WNL. No limitations in general ROM. No crepitus or effusions noted. HAV  B/L.  Hammer toes 2-5  B/L. Skin: No Porokeratosis. No infection or ulcers.  Bilateral dry skin mild to moderate without itching  Assessment & Plan:  Patient was evaluated and treated and all questions answered.  Onychomycosis with pain  -Nails palliatively debrided as below. -Educated on self-care  Bilateral bunion/hammertoe deformities -I explained to the patient the etiology of bunions and hammertoe to deformities and various treatment options were extensively discussed.  Given the patient is a diabetic with A1c of 9.1 with excessive pressure points to both of his feet I believe he will benefit from diabetic shoes.  He will be  scheduled to see rec for diabetic shoes.  Bilateral lower extremity xerosis -I explained to the patient the etiology of xerosis and various treatment options were extensively discussed.  I explained to the patient the importance of maintaining moisturization of the skin with application of over-the-counter lotion such as Eucerin or Luciderm.  I have asked the patient to apply this twice a day.  If unable to resolve patient will benefit from prescription lotion.   Procedure: Nail Debridement Rationale: pain  Type of Debridement: manual, sharp debridement. Instrumentation: Nail nipper, rotary burr. Number of Nails: 10  Procedures and Treatment: Consent by patient was obtained for treatment procedures. The patient understood the discussion of treatment and procedures well. All questions were answered thoroughly reviewed. Debridement of mycotic and hypertrophic toenails, 1 through 5 bilateral and clearing of subungual debris. No ulceration, no infection noted.  Return Visit-Office Procedure: Patient instructed to return to the office for a follow up visit 3 months for continued evaluation and treatment.  Boneta Lucks, DPM    No follow-ups on file.

## 2020-01-21 NOTE — Progress Notes (Signed)
Paramedicine Encounter    Patient ID: Calvin [name], male    DOB: Nov 24, 1960, 59 y.o.   MRN: [id number]   [name], NP as PCP - General (Geriatric Medicine) [name], MD as PCP - Cardiology (Cardiology) [name], [name], MD as PCP - Advanced Heart Failure (Cardiology) [name], MD as Consulting Physician (Endocrinology) [name], RN as [facility]  Patient Active Problem List   Diagnosis Date Noted   Hypoglycemia due to insulin 10/15/2019   Chronic anticoagulation 10/15/2019   COVID-19 virus detected 06/18/2019   Right sided weakness 06/17/2019   AKI (acute kidney injury) ([place]) 06/17/2019   Foot pain, bilateral 02/01/2019   Abdominal pain 01/23/2019   Chest pain 01/23/2019   Right leg pain 12/20/2018   Overgrown toenails 12/20/2018   Unsteady gait 12/20/2018   Encounter for therapeutic drug monitoring 10/22/2018   Nausea and vomiting 03/10/2018   AF (paroxysmal atrial fibrillation) ([place]) 03/10/2018   Non-cardiac chest pain 03/09/2018   Diabetic neuropathy ([place]) 09/26/2017   MCI (mild cognitive impairment) 06/23/2017   OSA on CPAP 05/10/2017   Gastroesophageal reflux disease 05/10/2017   Insomnia 05/10/2017   Onychomycosis of multiple toenails with type 2 diabetes mellitus and peripheral neuropathy ([place]) 61/60/7371   Chronic systolic heart failure ([place]) 11/22/2015   Essential hypertension 11/22/2015   DM (diabetes mellitus) ([place]) 11/20/2015   HLD (hyperlipidemia) 11/20/2015   Hx of adenomatous colonic polyps 05/19/2011    Current Outpatient Medications:    acetaminophen (TYLENOL) 500 MG tablet, Take 1 tablet (500 mg total) by mouth every 6 (six) hours as needed., Disp: 30 tablet, Rfl: 0   albuterol (VENTOLIN HFA) 108 (90 Base) MCG/ACT inhaler, Inhale 2 puffs into the lungs every 6 (six) hours as needed for wheezing or shortness of breath., Disp: 6.7 g, Rfl:  0   amitriptyline (ELAVIL) 10 MG tablet, Take 2 tablets at bed time, Disp: 180 tablet, Rfl: 3   apixaban (ELIQUIS) 5 MG TABS tablet, Take 1 tablet (5 mg total) by mouth 2 (two) times daily., Disp: 60 tablet, Rfl: 5   carvedilol (COREG) 6.25 MG tablet, Take 1 tablet (6.25 mg total) by mouth 2 (two) times daily with a meal., Disp: 180 tablet, Rfl: 3   digoxin (LANOXIN) 0.125 MG tablet, Take 1 tablet (0.125 mg total) by mouth daily., Disp: 90 tablet, Rfl: 3   donepezil (ARICEPT) 10 MG tablet, Take 1 tablet (10 mg total) by mouth at bedtime., Disp: 90 tablet, Rfl: 3   Evolocumab (REPATHA SURECLICK) 062 MG/ML SOAJ, Inject 1 pen into the skin every 14 (fourteen) days., Disp: 6 pen, Rfl: 3   ezetimibe (ZETIA) 10 MG tablet, Take 1 tablet (10 mg total) by mouth daily., Disp: 90 tablet, Rfl: 3   insulin degludec (TRESIBA FLEXTOUCH) 200 UNIT/ML FlexTouch Pen, Inject 30 Units into the skin daily., Disp: 15 mL, Rfl: 1   Insulin Syringe-Needle U-100 (INSULIN SYRINGE .3CC/29GX1/2") 29G X 1/2" 0.3 ML MISC, Inject 1 Syringe 3 (three) times daily as directed. Check blood sugar three times daily. Dx: E11.9 (Patient taking differently: Inject 1 Syringe as directed See admin instructions. Check blood sugar three times daily. Dx: E11.9 ), Disp: 100 each, Rfl: 3   Lancets (ONETOUCH DELICA PLUS [id number]) MISC, USE TWICE DAILY, Disp: 200 each, Rfl: 3   lidocaine (LMX) 4 % cream, Apply 1 application topically 3 (three) times daily as needed., Disp: 30 g, Rfl: 0   lisinopril (ZESTRIL) 10 MG tablet, Take  1 tablet (10 mg total) by mouth at bedtime., Disp: 90 tablet, Rfl: 3   ONETOUCH ULTRA test strip, USE TWICE DAILY, Disp: 200 strip, Rfl: 3   polyethylene glycol (MIRALAX / GLYCOLAX) 17 g packet, Take 17 g by mouth 2 (two) times daily., Disp: 14 each, Rfl: 0   senna (SENOKOT) 8.6 MG TABS tablet, Take 1 tablet (8.6 mg total) by mouth daily as needed for mild constipation., Disp: 15 tablet, Rfl: 0    cyclobenzaprine (FLEXERIL) 10 MG tablet, Take 1 tablet (10 mg total) by mouth 2 (two) times daily as needed for muscle spasms. (Patient not taking: Reported on 01/21/2020), Disp: 10 tablet, Rfl: 0   nitroGLYCERIN (NITROSTAT) 0.4 MG SL tablet, Place 1 tablet (0.4 mg total) under the tongue every 5 (five) minutes as needed for chest pain. (Patient not taking: Reported on 01/21/2020), Disp: 30 tablet, Rfl: 1 No Known Allergies    Social History   Socioeconomic History   Marital status: Single    Spouse name: Not on file   Number of children: 0   Years of education: Not on file   Highest education level: Not on file  Occupational History   Occupation: Disability  Tobacco Use   Smoking status: Never Smoker   Smokeless tobacco: Never Used  Vaping Use   Vaping Use: Never used  Substance and Sexual Activity   Alcohol use: No   Drug use: No   Sexual activity: Yes    Birth control/protection: None  Other Topics Concern   Not on file  Social History Narrative   Social History   Diet?    Do you drink/eat things with caffeine? yes   Marital status?       single      Do you live in a house, apartment, assisted living, condo, trailer, etc.? yes   Is it one or more stories? One story   How many persons live in your home?   Do you have any pets in your home? (please list)    Highest level of education completed? graduate   Do you exercise?            no                          Type & how often?   Advanced Directives   Do you have a living will? no   Do you have a DNR form?                                  If not, do you want to discuss one? no   Do you have signed POA/HPOA for forms? no      Functional Status   Do you have difficulty bathing or dressing yourself? no   Do you have difficulty preparing food or eating? no   Do you have difficulty managing your medications? no   Do you have difficulty managing your finances? no   Do you have difficulty affording your  medications? no   Social Determinants of Health   Financial Resource Strain:    Difficulty of Paying Living Expenses:   Food Insecurity: No Food Insecurity   Worried About Running Out of Food in the Last Year: Never true   Ran Out of Food in the Last Year: Never true  Transportation Needs: Unmet Transportation Needs   Lack of Transportation (Medical): Yes  Lack of Transportation (Non-Medical): Yes  Physical Activity:    Days of Exercise per Week:    Minutes of Exercise per Session:   Stress:    Feeling of Stress :   Social Connections:    Frequency of Communication with Friends and Family:    Frequency of Social Gatherings with Friends and Family:    Attends Religious Services:    Active Member of Clubs or Organizations:    Attends Archivist Meetings:    Marital Status:   Intimate Partner Violence:    Fear of Current or Ex-Partner:    Emotionally Abused:    Physically Abused:    Sexually Abused:     Physical Exam      Future Appointments  Date Time Provider [place]  01/24/2020  3:15 PM [name], NP [facility] None  02/04/2020  9:15 AM Imogene Burn, [name] [facility]  02/05/2020  9:30 AM [name], RN [facility] None  02/28/2020  1:30 PM [name], MD [facility] None  03/18/2020 11:30 AM Cameron Sprang, MD [facility] None  04/24/2020  3:15 PM Felipa Furnace, DPM [facility]    BP 124/76    Pulse 92    Temp 97.9 F (36.6 C)    Resp 18    Wt 164 lb (74.4 kg)    SpO2 98%    BMI 24.94 kg/m  CBG PTA-246 Weight yesterday- Last visit weight-164  Pt was seen at PCP for routine visit yesterday but then developed chest pain and dizziness so he was sent out to ER for eval. He was later d/c.  His PCP took him off spiro-he said she told him he didn't need it anymore. I sent message to nurse at clinic to see if she can reach providers to see if they agree with this or not since heart provider was the one  that placed him on it.  His appetite is slowly getting better. He is gaining weight. Up 3lbs from a month ago. I encouraged him to be sure to eat several small meals a day to help his CBG remain stable and so he dont have a lot of high and lows.  He reports after he was given NTG his c/p resolved. He reports c/p every now and then. Will see if he needs to come in for f/u since he had c/p yesterday and it does come and go. He reports c/p is off and on. He states it feels like something is pressing against his chest. He does get diaphoretic when he gets c/p, sometimes it will last as long as an hour but usually goes away within a few min.  No missed doses of his meds.  Verified meds and refilled 2 pill boxes with his assistance.  --heard back from provider and she advised that he can restart the spiro. This was relayed to the pt. He will be able to place it in pill box.     Marylouise Stacks, [facility] Paramedic  01/21/20

## 2020-01-23 NOTE — Telephone Encounter (Signed)
Late entry Calvin [name] w/paramedicine made aware on 7/20

## 2020-01-24 ENCOUNTER — Ambulatory Visit (INDEPENDENT_AMBULATORY_CARE_PROVIDER_SITE_OTHER): Payer: Medicare Other | Admitting: Nurse Practitioner

## 2020-01-24 ENCOUNTER — Other Ambulatory Visit: Payer: Self-pay

## 2020-01-24 ENCOUNTER — Encounter: Payer: Self-pay | Admitting: Nurse Practitioner

## 2020-01-24 VITALS — BP 134/78 | HR 92 | Temp 97.1°F | Ht 68.0 in | Wt 165.8 lb

## 2020-01-24 DIAGNOSIS — G4700 Insomnia, unspecified: Secondary | ICD-10-CM

## 2020-01-24 DIAGNOSIS — E785 Hyperlipidemia, unspecified: Secondary | ICD-10-CM

## 2020-01-24 DIAGNOSIS — K219 Gastro-esophageal reflux disease without esophagitis: Secondary | ICD-10-CM

## 2020-01-24 DIAGNOSIS — I480 Paroxysmal atrial fibrillation: Secondary | ICD-10-CM

## 2020-01-24 DIAGNOSIS — E1140 Type 2 diabetes mellitus with diabetic neuropathy, unspecified: Secondary | ICD-10-CM | POA: Diagnosis not present

## 2020-01-24 DIAGNOSIS — K5904 Chronic idiopathic constipation: Secondary | ICD-10-CM

## 2020-01-24 DIAGNOSIS — E1149 Type 2 diabetes mellitus with other diabetic neurological complication: Secondary | ICD-10-CM

## 2020-01-24 DIAGNOSIS — I5022 Chronic systolic (congestive) heart failure: Secondary | ICD-10-CM | POA: Diagnosis not present

## 2020-01-24 DIAGNOSIS — Z794 Long term (current) use of insulin: Secondary | ICD-10-CM

## 2020-01-24 DIAGNOSIS — I10 Essential (primary) hypertension: Secondary | ICD-10-CM | POA: Diagnosis not present

## 2020-01-24 DIAGNOSIS — R630 Anorexia: Secondary | ICD-10-CM

## 2020-01-24 NOTE — Patient Instructions (Signed)
To increase miralax to twice daily- decrease to daily if you start having loose stools  To call endocrinologist due to ongoing elevated blood sugars

## 2020-01-24 NOTE — Progress Notes (Signed)
Careteam: [name], NP as PCP - General (Geriatric Medicine) [name], MD as PCP - Cardiology (Cardiology) [name], [name], MD as PCP - Advanced Heart Failure (Cardiology) [name], MD as Consulting Physician (Endocrinology) [name], RN as [place] Management  PLACE OF SERVICE:  [place] Directive information    No Known Allergies  Chief Complaint  Patient presents with  . Medical Management of Chronic Issues    3 month follow-up  . Referral    Need a referral to an in network eye doctor   . Medication Management    Discuss a medication you told patient to d/c at last visit, per patient cardiology told patient to continue.      HPI: Patient is a 59 y.o. male for routine follow up. He was sent to ED during last OV, he had lightheadeness, chest pains and shortness of breath. EKG was without significant finding. He reports when he went to the ED they gave him ntg and it helped everything. Workup was unremarkable. He is planning to follow up with cardiologist. He has not had any other symptoms since then. No shortness of breath, swelling or chest pains.   GERD- no symptoms.   Insomnia- sleeps well even though he has vivid dreams on Remeron.   A fib- continues on eliquis for anticoagulation, coreg for rate.   DM- needs referral to a [place] eye doctor. Was referred but it was to an outside ophthalmologist. Recently saw endocrinologist, A1c up to 10.1, his tresiba was increased to 30 units, blood sugars in 200s. Eating 3 times daily. No blood sugars, always high. Diet "somewhat" good.   We had discussed coming off remeron at last visit due bad vivid dreams and pt wanted to come off medication, he was started on Remeron due to poor appetite. He is currently taking remeron. Continues with vivid dreams and poor appetite but his nurse Katie encouraged him to continue due to poor appetitie.  Weight has been stable.    Review of Systems:  Review of Systems  Constitutional: Negative for chills, fever and weight loss.  HENT: Negative for tinnitus.   Respiratory: Negative for cough, sputum production and shortness of breath.   Cardiovascular: Negative for chest pain, palpitations and leg swelling.  Gastrointestinal: Positive for constipation. Negative for abdominal pain, diarrhea and heartburn.  Genitourinary: Negative for dysuria, frequency and urgency.  Musculoskeletal: Negative for back pain, falls, joint pain and myalgias.  Skin: Negative.   Neurological: Positive for tingling. Negative for dizziness and headaches.  Psychiatric/Behavioral: Positive for memory loss. Negative for depression. The patient does not have insomnia.     Past Medical History:  Diagnosis Date  . Adenoidal hypertrophy   . Adenomatous colon polyps 2012  . Arthritis   . Diabetes mellitus   . GERD (gastroesophageal reflux disease)   . Heart attack ([place])    While living in [place]  [name] Hx of adenomatous colonic polyps 08/18/2017  . Hyperlipidemia   . Hypertension   . Mild CAD    a. cath in 08/2017 showing mild nonobstructive CAD with scattered 20-30% stenosis.   . Nonischemic cardiomyopathy ([place])    a. EF 20-25% by echo in 09/2017 with cath showing mild CAD. b.  Last echo 12/2017 EF 35-40%, grade 2 DD.  [name] Obesity   . PAF (paroxysmal atrial fibrillation) ([place])   . Sleep apnea    cpap, pt says no longer has  Past Surgical History:  Procedure Laterality Date  . COLONOSCOPY  05/13/11   9 adenomas  . FOREARM SURGERY    . MUSCLE BIOPSY    . RIGHT/LEFT HEART CATH AND CORONARY ANGIOGRAPHY N/A 08/25/2017   Procedure: RIGHT/LEFT HEART CATH AND CORONARY ANGIOGRAPHY;  Surgeon: [name], MD;  Location: [facility];  Service: Cardiovascular;  Laterality: N/A;  . RIGHT/LEFT HEART CATH AND CORONARY ANGIOGRAPHY N/A 10/01/2019   Procedure: RIGHT/LEFT HEART CATH AND CORONARY ANGIOGRAPHY;   Surgeon: [name], MD;  Location: [facility];  Service: Cardiovascular;  Laterality: N/A;   Social History:   reports that he has never smoked. He has never used smokeless tobacco. He reports that he does not drink alcohol and does not use drugs.  Family History  Problem Relation Age of Onset  . Heart disease Mother   . Heart attack Mother 50  . Hypertension Mother   . Hyperlipidemia Mother   . Diabetes Mother   . Heart disease Father   . Heart attack Father 49  . Hypertension Father   . Hyperlipidemia Father   . Diabetes Father   . Heart attack Sister 60  . Colon cancer Neg Hx   . Stomach cancer Neg Hx   . Esophageal cancer Neg Hx   . Rectal cancer Neg Hx     Medications: Patient's Medications  New Prescriptions   No medications on file  Previous Medications   ALBUTEROL (VENTOLIN HFA) 108 (90 BASE) MCG/ACT INHALER    Inhale 2 puffs into the lungs every 6 (six) hours as needed for wheezing or shortness of breath.   AMITRIPTYLINE (ELAVIL) 10 MG TABLET    Take 2 tablets at bed time   APIXABAN (ELIQUIS) 5 MG TABS TABLET    Take 1 tablet (5 mg total) by mouth 2 (two) times daily.   CARVEDILOL (COREG) 6.25 MG TABLET    Take 1 tablet (6.25 mg total) by mouth 2 (two) times daily with a meal.   CYCLOBENZAPRINE (FLEXERIL) 10 MG TABLET    Take 1 tablet (10 mg total) by mouth 2 (two) times daily as needed for muscle spasms.   DIGOXIN (LANOXIN) 0.125 MG TABLET    Take 1 tablet (0.125 mg total) by mouth daily.   DONEPEZIL (ARICEPT) 10 MG TABLET    Take 1 tablet (10 mg total) by mouth at bedtime.   EVOLOCUMAB (REPATHA SURECLICK) 476 MG/ML SOAJ    Inject 1 pen into the skin every 14 (fourteen) days.   EZETIMIBE (ZETIA) 10 MG TABLET    Take 1 tablet (10 mg total) by mouth daily.   INSULIN DEGLUDEC (TRESIBA FLEXTOUCH) 200 UNIT/ML FLEXTOUCH PEN    Inject 30 Units into the skin daily.   INSULIN SYRINGE-NEEDLE U-100 (INSULIN SYRINGE .3CC/29GX1/2") 29G X 1/2" 0.3 ML MISC     Inject 1 Syringe 3 (three) times daily as directed. Check blood sugar three times daily. Dx: E11.9   LANCETS (ONETOUCH DELICA PLUS [id number]) MISC    USE TWICE DAILY   LIDOCAINE (LMX) 4 % CREAM    Apply 1 application topically 3 (three) times daily as needed.   LISINOPRIL (ZESTRIL) 10 MG TABLET    Take 1 tablet (10 mg total) by mouth at bedtime.   NITROGLYCERIN (NITROSTAT) 0.4 MG SL TABLET    Place 1 tablet (0.4 mg total) under the tongue every 5 (five) minutes as needed for chest pain.   ONETOUCH ULTRA TEST STRIP    USE TWICE DAILY  POLYETHYLENE GLYCOL (MIRALAX / GLYCOLAX) 17 G PACKET    Take 17 g by mouth 2 (two) times daily.   SENNA (SENOKOT) 8.6 MG TABS TABLET    Take 1 tablet (8.6 mg total) by mouth daily as needed for mild constipation.  Modified Medications   No medications on file  Discontinued Medications   ACETAMINOPHEN (TYLENOL) 500 MG TABLET    Take 1 tablet (500 mg total) by mouth every 6 (six) hours as needed.    Physical Exam:  Vitals:   01/24/20 1517  BP: (!) 134/78  Pulse: 92  Temp: (!) 97.1 F (36.2 C)  TempSrc: Temporal  SpO2: 98%  Weight: 165 lb 12.8 oz (75.2 kg)  Height: 5' 8"  (1.727 m)   Body mass index is 25.21 kg/m. Wt Readings from Last 3 Encounters:  01/24/20 165 lb 12.8 oz (75.2 kg)  01/21/20 164 lb (74.4 kg)  01/20/20 164 lb 14.5 oz (74.8 kg)    Physical Exam Constitutional:      General: He is not in acute distress.    Appearance: He is well-developed. He is not diaphoretic.  HENT:     Head: Normocephalic and atraumatic.     Mouth/Throat:     Pharynx: No oropharyngeal exudate.  Eyes:     Conjunctiva/sclera: Conjunctivae normal.     Pupils: Pupils are equal, round, and reactive to light.  Cardiovascular:     Rate and Rhythm: Normal rate and regular rhythm.     Heart sounds: Normal heart sounds.  Pulmonary:     Effort: Pulmonary effort is normal.     Breath sounds: Normal breath sounds.  Abdominal:     General: Bowel sounds are normal.      Palpations: Abdomen is soft.  Musculoskeletal:        General: No tenderness.     Cervical back: Normal range of motion and neck supple.  Skin:    General: Skin is warm and dry.  Neurological:     Mental Status: He is alert and oriented to person, place, and time. Mental status is at baseline.     Gait: Gait normal.    Labs reviewed: Basic Metabolic Panel: Recent Labs    06/17/19 1240 06/17/19 1300 06/17/19 1643 06/17/19 1650 06/28/19 1240 06/28/19 1819 06/29/19 0250 07/13/19 2042 10/16/19 0403 10/17/19 0419 10/21/19 1618 10/21/19 1618 10/25/19 1014 12/24/19 1940 01/20/20 1059  NA 133*  --   --    < >  --    < > 134*   < > 138   < > 138   < > 137 134* 140  K 4.0  --   --    < >  --    < > 4.7   < > 3.9   < > 4.9   < > 4.5 4.1 4.1  CL 97*  --   --    < >  --    < > 96*   < > 103   < > 100   < > 101 102 108  CO2 22  --   --    < >  --    < > 25   < > 26   < > 29   < > 27 25 22   GLUCOSE 254*  --   --    < >  --    < > 143*   < > 146*   < > 162*   < > 247* 246* 228*  BUN  21*  --   --    < >  --    < > 26*   < > 8   < > 17   < > 21* 17 11  CREATININE 1.65*  --   --    < >  --    < > 1.00   < > 1.05   < > 1.19  --  1.24 1.53* 1.26*  CALCIUM 8.6*  --   --    < >  --    < > 8.6*   < > 8.7*   < > 9.7   < > 9.1 9.2 8.7*  MG 2.4  --   --    < > 2.6*  --  2.0  --  1.9  --   --   --   --   --   --   PHOS 3.1  --   --   --   --   --   --   --   --   --   --   --   --   --   --   TSH  --  1.854 2.428  --   --   --   --   --   --   --  1.72  --   --   --   --    < > = values in this interval not displayed.   Liver Function Tests: Recent Labs    06/29/19 0250 06/29/19 0250 08/28/19 0943 10/13/19 2202 01/20/20 1059  AST 23   < > 12 28 18   ALT 23   < > 12 28 15   ALKPHOS 54  --   --  58 64  BILITOT 0.4   < > 0.4 0.4 0.5  PROT 5.6*   < > 5.8* 6.2* 5.8*  ALBUMIN 2.6*  --   --  3.5 3.2*   < > = values in this interval not displayed.   No results for input(s): LIPASE, AMYLASE  in the last 8760 hours. No results for input(s): AMMONIA in the last 8760 hours. CBC: Recent Labs    10/13/19 2202 10/13/19 2211 10/21/19 1618 12/24/19 1940 01/20/20 1059  WBC 4.7   < > 7.2 5.6 5.2  NEUTROABS 3.6  --  3,982  --  2.7  HGB 14.7   < > 15.5 14.2 13.3  HCT 43.6   < > 46.3 42.1 40.2  MCV 93.0   < > 91.5 89.4 91.8  PLT 203   < > 338 251 228   < > = values in this interval not displayed.   Lipid Panel: Recent Labs    04/08/19 0801  CHOL 152  HDL 57  LDLCALC 81  TRIG 70  CHOLHDL 2.7   TSH: Recent Labs    06/17/19 1300 06/17/19 1643 10/21/19 1618  TSH 1.854 2.428 1.72   A1C: Lab Results  Component Value Date   HGBA1C 10.1 (A) 01/15/2020     Assessment/Plan 1. Type 2 diabetes mellitus with diabetic neuropathy, with long-term current use of insulin (HCC) -continues to have high blood sugar, has follow up with endocrinology but encouraged to call at this time to see if any adjustment needed.  - Ambulatory referral to Ophthalmology  2. Essential hypertension Stable, encouraged to continue dietary modifications. Continues on coreg, lisinopril.  3. Chronic systolic heart failure (HCC) Stable, without swelling, chest pains, shortness of breath.   4. AF (paroxysmal atrial fibrillation) ([place]) -  rate controlled, continues on digoxin, coreg for rate control with eliquis 5 mg daily   5. Gastroesophageal reflux disease without esophagitis Stable at this time. No further symptoms, not on medicatons.   6. Other diabetic neurological complication associated with type 2 diabetes mellitus ([place]) Stable at this time, no worsening of symptoms.   7. Hyperlipidemia, unspecified hyperlipidemia type Followed by cardiology, to continue zetia and raptha   8. Insomnia, unspecified type Continues on remeron which has helped.   9. Poor appetite Has decided to continue Remeron to help with appetite. Still have dreams but less intense.   10. Constipation  Improved but  still ongoing, to increase miralax to twice daily  Next appt: 4 month  Doyce Stonehouse K. [place], [facility] [phone]

## 2020-01-27 ENCOUNTER — Telehealth: Payer: Self-pay

## 2020-01-27 NOTE — Telephone Encounter (Signed)
In my absence, it appears whomever covered faxed Rx to [facility] on 01/15/20 @ 1538 but failed to document. This is a late entry for whomever faxed Rx as indicated above.

## 2020-01-28 NOTE — Progress Notes (Signed)
Cardiology Office Note    Date:  02/04/2020   ID:  Calvin [name], DOB [birth year], MRN [id number]  PCP:  [name], NP  Cardiologist: [name], MD EPS: None  Chief Complaint  Patient presents with  . Hospitalization Follow-up    History of Present Illness:  Calvin [name] is a 59 y.o. male with a history of chronic systolic heart failure due to NICM with EF 35-40%, uncontrolled DMII, GERD, HTN, MI 2015, memory issues, and hyperlipidemia.   Admitted with 08/2017 with chest pain. 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  Readmitted in 10/2017 with chest pain. CTA was negative for PE. He was also admitted in 03/2018 with chest pain. He had a brief episode of A fib and was started on eliquis and carvedilol was increased to 6.25 mg twice a day.  This was not thought to be ACS.    In the past he was on Entresto but this was later stopped due to dizziness. Placed back on lisinopril.    Had sleep study 07/2018 that was normal.    Had repeat 2D Echo 01/2019 that showed EF 35-40%, mild LVH, normal RV size and function. No significant valvular dysfunction.    06/2019, he was admitted to [facility] for Covid 19 infection. Was placed on BiPAP but did not require intubation. Was there for about 3 weeks.    Had a lot of chest pain fatigue and dyspnea after Covid and underwent another right and left heart catheterization 09/27/2019 with moderate nonobstructive CAD, LVEF 35 to 40%, normal filling pressures and high cardiac output without evidence of intracardiac shunting.  Medical therapy recommended and consider work-up for peripheral shunting process.  Back in the ED 01/20/2020 with chest pain shortness of breath and dizziness labs stable, troponins negative, EKG normal sinus rhythm with LVH nonspecific T wave inversion laterally, no acute change. CXR clear.  Patient comes in today for f/u. Still short of breath with little activity. No  chest pain since ER. Complains of dizziness and lightheaded at rest not with change of position. Didn't eat or check his blood sugar. They've been running high 200-300's.Forgot his cane today, feels off balance.   Past Medical History:  Diagnosis Date  . Adenoidal hypertrophy   . Adenomatous colon polyps 2012  . Arthritis   . Diabetes mellitus   . GERD (gastroesophageal reflux disease)   . Heart attack ([place])    While living in [place]  [name] Hx of adenomatous colonic polyps 08/18/2017  . Hyperlipidemia   . Hypertension   . Mild CAD    a. cath in 08/2017 showing mild nonobstructive CAD with scattered 20-30% stenosis.   . Nonischemic cardiomyopathy ([place])    a. EF 20-25% by echo in 09/2017 with cath showing mild CAD. b.  Last echo 12/2017 EF 35-40%, grade 2 DD.  [name] Obesity   . PAF (paroxysmal atrial fibrillation) ([place])   . Sleep apnea    cpap, pt says no longer has    Past Surgical History:  Procedure Laterality Date  . COLONOSCOPY  05/13/11   9 adenomas  . FOREARM SURGERY    . MUSCLE BIOPSY    . RIGHT/LEFT HEART CATH AND CORONARY ANGIOGRAPHY N/A 08/25/2017   Procedure: RIGHT/LEFT HEART CATH AND CORONARY ANGIOGRAPHY;  Surgeon: [name], MD;  Location: [place] [facility];  Service: Cardiovascular;  Laterality: N/A;  . RIGHT/LEFT HEART CATH AND CORONARY ANGIOGRAPHY N/A 10/01/2019   Procedure: RIGHT/LEFT HEART CATH  AND CORONARY ANGIOGRAPHY;  Surgeon: [name], MD;  Location: [place] [facility];  Service: Cardiovascular;  Laterality: N/A;    Current Medications: Current Meds  Medication Sig  . albuterol (VENTOLIN HFA) 108 (90 Base) MCG/ACT inhaler Inhale 2 puffs into the lungs every 6 (six) hours as needed for wheezing or shortness of breath.  [name] amitriptyline (ELAVIL) 10 MG tablet Take 2 tablets at bed time  . apixaban (ELIQUIS) 5 MG TABS tablet Take 1 tablet (5 mg total) by mouth 2 (two) times daily.  . carvedilol (COREG) 6.25 MG tablet Take 1 tablet (6.25 mg total)  by mouth 2 (two) times daily with a meal.  . cyclobenzaprine (FLEXERIL) 10 MG tablet Take 1 tablet (10 mg total) by mouth 2 (two) times daily as needed for muscle spasms.  . digoxin (LANOXIN) 0.125 MG tablet Take 1 tablet (0.125 mg total) by mouth daily.  [name] donepezil (ARICEPT) 10 MG tablet Take 1 tablet (10 mg total) by mouth at bedtime.  . Evolocumab (REPATHA SURECLICK) 491 MG/ML SOAJ Inject 1 pen into the skin every 14 (fourteen) days.  [name] ezetimibe (ZETIA) 10 MG tablet Take 1 tablet (10 mg total) by mouth daily.  . insulin degludec (TRESIBA FLEXTOUCH) 200 UNIT/ML FlexTouch Pen Inject 30 Units into the skin daily.  . Insulin Syringe-Needle U-100 (INSULIN SYRINGE .3CC/29GX1/2") 29G X 1/2" 0.3 ML MISC Inject 1 Syringe 3 (three) times daily as directed. Check blood sugar three times daily. Dx: E11.9 (Patient taking differently: Inject 1 Syringe as directed See admin instructions. Check blood sugar three times daily. Dx: E11.9 )  . Lancets (ONETOUCH DELICA PLUS PHXTAV69V) MISC USE TWICE DAILY  . lidocaine (LMX) 4 % cream Apply 1 application topically 3 (three) times daily as needed.  [name] lisinopril (ZESTRIL) 10 MG tablet Take 1 tablet (10 mg total) by mouth at bedtime.  . nitroGLYCERIN (NITROSTAT) 0.4 MG SL tablet Place 1 tablet (0.4 mg total) under the tongue every 5 (five) minutes as needed for chest pain.  [name] ULTRA test strip USE TWICE DAILY  . polyethylene glycol (MIRALAX / GLYCOLAX) 17 g packet Take 17 g by mouth 2 (two) times daily.  [name] senna (SENOKOT) 8.6 MG TABS tablet Take 1 tablet (8.6 mg total) by mouth daily as needed for mild constipation.     Allergies:   Patient has no known allergies.   Social History   Socioeconomic History  . Marital status: Single    Spouse name: Not on file  . Number of children: 0  . Years of education: Not on file  . Highest education level: Not on file  Occupational History  . Occupation: Disability  Tobacco Use  . Smoking status: Never Smoker   . Smokeless tobacco: Never Used  Vaping Use  . Vaping Use: Never used  Substance and Sexual Activity  . Alcohol use: No  . Drug use: No  . Sexual activity: Yes    Birth control/protection: None  Other Topics Concern  . Not on file  Social History Narrative   Social History   Diet?    Do you drink/eat things with caffeine? yes   Marital status?       single      Do you live in a house, apartment, assisted living, condo, trailer, etc.? yes   Is it one or more stories? One story   How many persons live in your home?   Do you have any pets in your home? (please list)  Highest level of education completed? graduate   Do you exercise?            no                          Type & how often?   Advanced Directives   Do you have a living will? no   Do you have a DNR form?                                  If not, do you want to discuss one? no   Do you have signed POA/HPOA for forms? no      Functional Status   Do you have difficulty bathing or dressing yourself? no   Do you have difficulty preparing food or eating? no   Do you have difficulty managing your medications? no   Do you have difficulty managing your finances? no   Do you have difficulty affording your medications? no   Social Determinants of Health   Financial Resource Strain:   . Difficulty of Paying Living Expenses:   Food Insecurity: No Food Insecurity  . Worried About Charity fundraiser in the Last Year: Never true  . Ran Out of Food in the Last Year: Never true  Transportation Needs: Unmet Transportation Needs  . Lack of Transportation (Medical): Yes  . Lack of Transportation (Non-Medical): Yes  Physical Activity:   . Days of Exercise per Week:   . Minutes of Exercise per Session:   Stress:   . Feeling of Stress :   Social Connections:   . Frequency of Communication with Friends and Family:   . Frequency of Social Gatherings with Friends and Family:   . Attends Religious Services:   . Active Member of  Clubs or Organizations:   . Attends Archivist Meetings:   [name] Marital Status:      Family History:  The patient's family history includes Diabetes in his father and mother; Heart attack (age of onset: 54) in his mother; Heart attack (age of onset: 43) in his sister; Heart attack (age of onset: 23) in his father; Heart disease in his father and mother; Hyperlipidemia in his father and mother; Hypertension in his father and mother.   ROS:   Please see the history of present illness.    ROS All other systems reviewed and are negative.   PHYSICAL EXAM:   VS:  BP 136/78   Pulse 89   Ht 5' 8"  (1.727 m)   Wt 166 lb 9.6 oz (75.6 kg)   SpO2 94%   BMI 25.33 kg/m   Physical Exam  GEN: Thin, in no acute distress  Neck: no JVD, carotid bruits, or masses Cardiac:RRR; S4, 2/6 systolic murmur LSB Respiratory:  clear to auscultation bilaterally, normal work of breathing GI: soft, nontender, nondistended, + BS Ext: without cyanosis, clubbing, or edema, Good distal pulses bilaterally Neuro:  Alert and Oriented x 3 Psych: euthymic mood, full affect  Wt Readings from Last 3 Encounters:  02/04/20 166 lb 9.6 oz (75.6 kg)  01/24/20 165 lb 12.8 oz (75.2 kg)  01/21/20 164 lb (74.4 kg)      Studies/Labs Reviewed:   EKG:  EKG is not ordered today.    Recent Labs: 06/29/2019: B Natriuretic Peptide 44.1 10/16/2019: Magnesium 1.9 10/21/2019: TSH 1.72 01/20/2020: ALT 15; BUN 11; Creatinine, Ser 1.26; Hemoglobin 13.3;  Platelets 228; Potassium 4.1; Sodium 140   Lipid Panel    Component Value Date/Time   CHOL 152 04/08/2019 0801   TRIG 70 04/08/2019 0801   HDL 57 04/08/2019 0801   CHOLHDL 2.7 04/08/2019 0801   CHOLHDL 3.8 03/09/2018 1500   VLDL 10 03/09/2018 1500   LDLCALC 81 04/08/2019 0801   LDLCALC 135 (H) 09/22/2017 0954    Additional studies/ records that were reviewed today include:  Right and left heart cath 10/01/2019  Mid LAD lesion is 40% stenosed.  Prox Cx to Dist Cx  lesion is 40% stenosed.  Prox RCA to Mid RCA lesion is 30% stenosed.  Mid RCA lesion is 50% stenosed.  Dist RCA lesion is 30% stenosed.  Dist LAD lesion is 60% stenosed.   Findings:   Ao = 123/67 (91) LV = 123/10 RA =  3 RV = 22/4 PA = 21/8 (14) PCW = 8 Fick cardiac output/index = 7.4/4.3 PVR = 0.8 WU Ao sat = 100% PA sat = 79%, 80% High SVC sat = 85%   Assessment: 1. Moderate non-obstructive CAD 2. NICM EF 35-40% 3. Normal filling pressures 4. High cardiac output without evidence of intracardiac shunting   Plan/Discussion:   Medical therapy. Consider w/u for peripheral shunting process.    Glori Bickers, MD  8:46 AM Echo  09/27/19 EF 40-45% RV ok.   IMPRESSIONS     1. Moderate to severe global reduction in LV systolic function; mild LVH;  grade 1 diastolic dysfunction.   2. Left ventricular ejection fraction, by estimation, is 30 to 35%. The  left ventricle has moderate to severely decreased function. The left  ventricle demonstrates global hypokinesis. There is mild left ventricular  hypertrophy. Left ventricular  diastolic parameters are consistent with Grade I diastolic dysfunction  (impaired relaxation).   3. Right ventricular systolic function is normal. The right ventricular  size is normal.   4. The mitral valve is normal in structure. Trivial mitral valve  regurgitation. No evidence of mitral stenosis.   5. The aortic valve is tricuspid. Aortic valve regurgitation is not  visualized. Mild aortic valve sclerosis is present, with no evidence of  aortic valve stenosis.   6. The inferior vena cava is normal in size with greater than 50%  respiratory variability, suggesting right atrial pressure of 3 mmHg.   ECHO 12/2017  EF 35-40% Grade II DD. Moderate global reduction in LV systolic function; moderate   diastolic dysfunction. ECHO 09/2017  EF 20-25% Grade IDD   LHC/RHC 08/2017  RA 5  PA 22/11 (15)  PCWP 9  CO 6 CI 3/14   Prox RCA to Mid RCA  lesion is 30% stenosed.  Mid RCA to Dist RCA lesion is 30% stenosed.  Prox Cx to Dist Cx lesion is 20% stenosed.  Mid LAD lesion is 30% stenosed.  There is mild to moderate left ventricular systolic dysfunction.  LV end diastolic pressure is mildly elevated.  The left ventricular ejection fraction is 35-45% by visual estimate.  There is no mitral valve regurgitation.       ASSESSMENT:    1. Precordial pain   2. Coronary artery disease involving native coronary artery of native heart without angina pectoris   3. NICM (nonischemic cardiomyopathy) ([place])   4. Essential hypertension   5. Hyperlipidemia, unspecified hyperlipidemia type   6. Paroxysmal atrial fibrillation (H[facility])   7. Type 2 diabetes mellitus with diabetic neuropathy, with long-term current use of insulin ([place])  PLAN:  In order of problems listed above:  Recurrent chest pain nitroglycerin responsive in the emergency room 01/20/2020 troponins negative EKG unchanged.  Significant work-up in the past for CAD with most recent cardiac cath 09/2019 see above. No further chest pain  Moderate nonobstructive CAD on cath 09/2019  Nonischemic cardiomyopathy ejection fraction 30-35 % on echo 09/2019-no CHF on exam. Short of breath since covid 19. F/u with AHF  Hypertension BP stable  Hyperlipidemia LDL 81 04/2019  PAF on Eliquis no bleeding problems and labs stable in ED.  Diabetes mellitus uncontrolled and dizziness with elevated blood sugars. F/u with Dr. Loanne Drilling.  Medication Adjustments/Labs and Tests Ordered: Current medicines are reviewed at length with the patient today.  Concerns regarding medicines are outlined above.  Medication changes, Labs and Tests ordered today are listed in the Patient Instructions below. Patient Instructions  Medication Instructions:  Your physician recommends that you continue on your current medications as directed. Please refer to the Current Medication list given to you today.   *If you need a refill on your cardiac medications before your next appointment, please call your pharmacy*   Lab Work: None If you have labs (blood work) drawn today and your tests are completely normal, you will receive your results only by: [name] MyChart Message (if you have MyChart) OR . A paper copy in the mail If you have any lab test that is abnormal or we need to change your treatment, we will call you to review the results.   Testing/Procedures: None   Follow-Up: At [facility], you and your health needs are our priority.  As part of our continuing mission to provide you with exceptional heart care, we have created designated Provider Care Teams.  These Care Teams include your primary Cardiologist (physician) and Advanced Practice Providers (APPs -  Physician Assistants and Nurse Practitioners) who all work together to provide you with the care you need, when you need it.  We recommend signing up for the patient portal called "MyChart".  Sign up information is provided on this After Visit Summary.  MyChart is used to connect with patients for Virtual Visits (Telemedicine).  Patients are able to view lab/test results, encounter notes, upcoming appointments, etc.  Non-urgent messages can be sent to your provider as well.   To learn more about what you can do with MyChart, go to [email or web address].    Your next appointment:   6 month(s)  The format for your next appointment:   In Person  Provider:   You may see [name], MD or one of the following Advanced Practice Providers on your designated Care Team:    Melina Copa, [name]  Ermalinda Barrios, [name]    Other Instructions Follow up with Dr Loanne Drilling for your Diabetes Follow up with Dr Haroldine Laws at the [facility] [phone]     Signed, Ermalinda Barrios, [name]  02/04/2020 9:46 AM    [address], [place], [place]  [id number] Phone: [phone]; Fax: [phone]

## 2020-02-04 ENCOUNTER — Encounter: Payer: Self-pay | Admitting: Physician Assistant

## 2020-02-04 ENCOUNTER — Ambulatory Visit (INDEPENDENT_AMBULATORY_CARE_PROVIDER_SITE_OTHER): Payer: Medicare Other | Admitting: Physician Assistant

## 2020-02-04 ENCOUNTER — Other Ambulatory Visit: Payer: Self-pay

## 2020-02-04 ENCOUNTER — Other Ambulatory Visit (HOSPITAL_COMMUNITY): Payer: Self-pay

## 2020-02-04 VITALS — BP 136/78 | HR 89 | Ht 68.0 in | Wt 166.6 lb

## 2020-02-04 DIAGNOSIS — I428 Other cardiomyopathies: Secondary | ICD-10-CM

## 2020-02-04 DIAGNOSIS — I480 Paroxysmal atrial fibrillation: Secondary | ICD-10-CM

## 2020-02-04 DIAGNOSIS — R072 Precordial pain: Secondary | ICD-10-CM

## 2020-02-04 DIAGNOSIS — E785 Hyperlipidemia, unspecified: Secondary | ICD-10-CM | POA: Diagnosis not present

## 2020-02-04 DIAGNOSIS — Z794 Long term (current) use of insulin: Secondary | ICD-10-CM

## 2020-02-04 DIAGNOSIS — I10 Essential (primary) hypertension: Secondary | ICD-10-CM

## 2020-02-04 DIAGNOSIS — E1140 Type 2 diabetes mellitus with diabetic neuropathy, unspecified: Secondary | ICD-10-CM

## 2020-02-04 DIAGNOSIS — I2510 Atherosclerotic heart disease of native coronary artery without angina pectoris: Secondary | ICD-10-CM | POA: Diagnosis not present

## 2020-02-04 NOTE — Patient Instructions (Signed)
Medication Instructions:  Your physician recommends that you continue on your current medications as directed. Please refer to the Current Medication list given to you today.  *If you need a refill on your cardiac medications before your next appointment, please call your pharmacy*   Lab Work: None If you have labs (blood work) drawn today and your tests are completely normal, you will receive your results only by: [name] MyChart Message (if you have MyChart) OR . A paper copy in the mail If you have any lab test that is abnormal or we need to change your treatment, we will call you to review the results.   Testing/Procedures: None   Follow-Up: At [facility], you and your health needs are our priority.  As part of our continuing mission to provide you with exceptional heart care, we have created designated Provider Care Teams.  These Care Teams include your primary Cardiologist (physician) and Advanced Practice Providers (APPs -  Physician Assistants and Nurse Practitioners) who all work together to provide you with the care you need, when you need it.  We recommend signing up for the patient portal called "MyChart".  Sign up information is provided on this After Visit Summary.  MyChart is used to connect with patients for Virtual Visits (Telemedicine).  Patients are able to view lab/test results, encounter notes, upcoming appointments, etc.  Non-urgent messages can be sent to your provider as well.   To learn more about what you can do with MyChart, go to [email or web address].    Your next appointment:   6 month(s)  The format for your next appointment:   In Person  Provider:   You may see [name], MD or one of the following Advanced Practice Providers on your designated Care Team:    Melina Copa, [name]  Ermalinda Barrios, [name]    Other Instructions Follow up with Dr Loanne Drilling for your Diabetes Follow up with Dr Haroldine Laws at the [facility] [phone]

## 2020-02-04 NOTE — Progress Notes (Signed)
Paramedicine Encounter    Patient ID: Calvin [name], male    DOB: Jul 22, 1960, 59 y.o.   MRN: [id number]   [name], NP as PCP - General (Geriatric Medicine) [name], MD as PCP - Cardiology (Cardiology) [name], [name], MD as PCP - Advanced Heart Failure (Cardiology) [name], MD as Consulting Physician (Endocrinology) [name], RN as [place] Management  Patient Active Problem List   Diagnosis Date Noted  . Hypoglycemia due to insulin 10/15/2019  . Chronic anticoagulation 10/15/2019  . COVID-19 virus detected 06/18/2019  . Right sided weakness 06/17/2019  . AKI (acute kidney injury) ([place]) 06/17/2019  . Foot pain, bilateral 02/01/2019  . Abdominal pain 01/23/2019  . Chest pain 01/23/2019  . Right leg pain 12/20/2018  . Overgrown toenails 12/20/2018  . Unsteady gait 12/20/2018  . Encounter for therapeutic drug monitoring 10/22/2018  . Nausea and vomiting 03/10/2018  . AF (paroxysmal atrial fibrillation) ([place]) 03/10/2018  . Non-cardiac chest pain 03/09/2018  . Diabetic neuropathy ([place]) 09/26/2017  . MCI (mild cognitive impairment) 06/23/2017  . OSA on CPAP 05/10/2017  . Gastroesophageal reflux disease 05/10/2017  . Insomnia 05/10/2017  . Onychomycosis of multiple toenails with type 2 diabetes mellitus and peripheral neuropathy ([place]) 05/10/2017  . Chronic systolic heart failure ([place]) 11/22/2015  . Essential hypertension 11/22/2015  . DM (diabetes mellitus) ([place]) 11/20/2015  . HLD (hyperlipidemia) 11/20/2015  . Hx of adenomatous colonic polyps 05/19/2011    Current Outpatient Medications:  .  albuterol (VENTOLIN HFA) 108 (90 Base) MCG/ACT inhaler, Inhale 2 puffs into the lungs every 6 (six) hours as needed for wheezing or shortness of breath., Disp: 6.7 g, Rfl: 0 .  amitriptyline (ELAVIL) 10 MG tablet, Take 2 tablets at bed time, Disp: 180 tablet, Rfl: 3 .  apixaban (ELIQUIS) 5 MG TABS tablet,  Take 1 tablet (5 mg total) by mouth 2 (two) times daily., Disp: 60 tablet, Rfl: 5 .  carvedilol (COREG) 6.25 MG tablet, Take 1 tablet (6.25 mg total) by mouth 2 (two) times daily with a meal., Disp: 180 tablet, Rfl: 3 .  digoxin (LANOXIN) 0.125 MG tablet, Take 1 tablet (0.125 mg total) by mouth daily., Disp: 90 tablet, Rfl: 3 .  donepezil (ARICEPT) 10 MG tablet, Take 1 tablet (10 mg total) by mouth at bedtime., Disp: 90 tablet, Rfl: 3 .  Evolocumab (REPATHA SURECLICK) 086 MG/ML SOAJ, Inject 1 pen into the skin every 14 (fourteen) days., Disp: 6 pen, Rfl: 3 .  ezetimibe (ZETIA) 10 MG tablet, Take 1 tablet (10 mg total) by mouth daily., Disp: 90 tablet, Rfl: 3 .  insulin degludec (TRESIBA FLEXTOUCH) 200 UNIT/ML FlexTouch Pen, Inject 30 Units into the skin daily., Disp: 15 mL, Rfl: 1 .  Insulin Syringe-Needle U-100 (INSULIN SYRINGE .3CC/29GX1/2") 29G X 1/2" 0.3 ML MISC, Inject 1 Syringe 3 (three) times daily as directed. Check blood sugar three times daily. Dx: E11.9 (Patient taking differently: Inject 1 Syringe as directed See admin instructions. Check blood sugar three times daily. Dx: E11.9 ), Disp: 100 each, Rfl: 3 .  Lancets (ONETOUCH DELICA PLUS [id number]) MISC, USE TWICE DAILY, Disp: 200 each, Rfl: 3 .  lidocaine (LMX) 4 % cream, Apply 1 application topically 3 (three) times daily as needed., Disp: 30 g, Rfl: 0 .  lisinopril (ZESTRIL) 10 MG tablet, Take 1 tablet (10 mg total) by mouth at bedtime., Disp: 90 tablet, Rfl: 3 .  ONETOUCH ULTRA test strip, USE TWICE DAILY, Disp: 200 strip,  Rfl: 3 .  polyethylene glycol (MIRALAX / GLYCOLAX) 17 g packet, Take 17 g by mouth 2 (two) times daily., Disp: 14 each, Rfl: 0 .  senna (SENOKOT) 8.6 MG TABS tablet, Take 1 tablet (8.6 mg total) by mouth daily as needed for mild constipation., Disp: 15 tablet, Rfl: 0 .  spironolactone (ALDACTONE) 25 MG tablet, Take 25 mg by mouth daily., Disp: , Rfl:  .  cyclobenzaprine (FLEXERIL) 10 MG tablet, Take 1 tablet (10 mg  total) by mouth 2 (two) times daily as needed for muscle spasms. (Patient not taking: Reported on 02/04/2020), Disp: 10 tablet, Rfl: 0 .  nitroGLYCERIN (NITROSTAT) 0.4 MG SL tablet, Place 1 tablet (0.4 mg total) under the tongue every 5 (five) minutes as needed for chest pain. (Patient not taking: Reported on 02/04/2020), Disp: 30 tablet, Rfl: 1 No Known Allergies    Social History   Socioeconomic History  . Marital status: Single    Spouse name: Not on file  . Number of children: 0  . Years of education: Not on file  . Highest education level: Not on file  Occupational History  . Occupation: Disability  Tobacco Use  . Smoking status: Never Smoker  . Smokeless tobacco: Never Used  Vaping Use  . Vaping Use: Never used  Substance and Sexual Activity  . Alcohol use: No  . Drug use: No  . Sexual activity: Yes    Birth control/protection: None  Other Topics Concern  . Not on file  Social History Narrative   Social History   Diet?    Do you drink/eat things with caffeine? yes   Marital status?       single      Do you live in a house, apartment, assisted living, condo, trailer, etc.? yes   Is it one or more stories? One story   How many persons live in your home?   Do you have any pets in your home? (please list)    Highest level of education completed? graduate   Do you exercise?            no                          Type & how often?   Advanced Directives   Do you have a living will? no   Do you have a DNR form?                                  If not, do you want to discuss one? no   Do you have signed POA/HPOA for forms? no      Functional Status   Do you have difficulty bathing or dressing yourself? no   Do you have difficulty preparing food or eating? no   Do you have difficulty managing your medications? no   Do you have difficulty managing your finances? no   Do you have difficulty affording your medications? no   Social Determinants of Health   Financial Resource  Strain:   . Difficulty of Paying Living Expenses:   Food Insecurity: No Food Insecurity  . Worried About Charity fundraiser in the Last Year: Never true  . Ran Out of Food in the Last Year: Never true  Transportation Needs: Unmet Transportation Needs  . Lack of Transportation (Medical): Yes  . Lack of Transportation (Non-Medical): Yes  Physical Activity:   .  Days of Exercise per Week:   . Minutes of Exercise per Session:   Stress:   . Feeling of Stress :   Social Connections:   . Frequency of Communication with Friends and Family:   . Frequency of Social Gatherings with Friends and Family:   . Attends Religious Services:   . Active Member of Clubs or Organizations:   . Attends Archivist Meetings:   [name] Marital Status:   Intimate Partner Violence:   . Fear of Current or Ex-Partner:   . Emotionally Abused:   [name] Physically Abused:   . Sexually Abused:     Physical Exam      Future Appointments  Date Time Provider [place]  02/05/2020  9:30 AM [name], RN [facility] None  02/28/2020  1:30 PM [name], MD [facility] None  03/18/2020 11:30 AM Cameron Sprang, MD [facility] None  04/24/2020  3:15 PM Felipa Furnace, DPM [facility]  05/25/2020 11:30 AM [name], Carlos American, NP [facility] None    BP (!) 152/70   Pulse 87   Temp 98 F (36.7 C)   Resp 18   Wt 167 lb (75.8 kg)   SpO2 99%   BMI 25.39 kg/m  CBG PTA-345 Weight yesterday-167 Last visit weight-164  Pt reports he is doing ok, he has felt dizzy/light headed, he went to doc today-they told him that dizziness was likely coming from his uncontrolled CBG's.  He reports CBG's running in the 300s.  Pt filled his own pill box with my supervision. He still needs some supervision while filling pill box. He made quite a few errors- He has a lot of distractions he needs to remove himself from before he fills this on his own.  He is going to call in his eliquis for refill. He is out for a few days.  So we will still continue to work on that.  He is out of the repatha-he will call pharmacy. The spiro was added back to his list.  He is drinking lots of fruit juices, advised him to get sugar free or drink water.  Pt denies c/p, no sob. Just trying to get his CBG under better control.   Marylouise Stacks, [place] Paramedic  02/04/20

## 2020-02-05 ENCOUNTER — Other Ambulatory Visit: Payer: Self-pay | Admitting: *Deleted

## 2020-02-05 NOTE — Patient Outreach (Signed)
[facility]) Care Management  02/05/2020  Calvin [name] 1960/11/16 [id number]   Call placed to member to follow up on management of diabetes.  Noted that last A1C has increased from 9.2 to 10.1.  He admits that he is still struggling to manage his diabetes, state he is "trying."  He verbalizes understanding regarding well balanced diet, does not always make the right choices in addition to decreased appetite.  He has been taking Remeron to increase appetite, does not feel it is working.  He has reached out to Dr. Loanne Drilling to try to move appointment date up from 8/27, PCP feels adjustment to medications may be warranted.  Member has been having episodes of dizziness, lightheadedness, and vision problems in relation to elevated blood sugars.  Report this morning was 345, readings average high 200's-300's consistently.  Referral was also placed to ophthalmology, he is waiting on a call back to schedule office visit.  Paramedicine made home visit yesterday as well.  Denies any urgent concerns at this time, encouraged to contact this care manager with questions.  Agrees to follow up within the next month.  Goals Addressed              This Visit's Progress   .  Get my blood sugars better (pt-stated)        CARE PLAN ENTRY (see longtitudinal plan of care for additional care plan information)  Objective:  Lab Results  Component Value Date   HGBA1C 10.1 (A) 01/15/2020 .   Lab Results  Component Value Date   CREATININE 1.26 (H) 01/20/2020   CREATININE 1.53 (H) 12/24/2019   CREATININE 1.24 10/25/2019 .   [name] No results found for: EGFR  Current Barriers:  [name] Knowledge Deficits related to basic Diabetes pathophysiology and self care/management . Knowledge Deficits related to medications used for management of diabetes  Case Manager Clinical Goal(s):  Over the next 60 days, patient will demonstrate improved adherence to prescribed treatment plan for diabetes self  care/management as evidenced by: decreased A1C </= 8 . Verbalize daily monitoring and recording of CBG within 28 days . Verbalize adherence to ADA/ carb modified diet within the next 28 days . Verbalize adherence to prescribed medication regimen within the next 28 days   Interventions:  . Provided education to patient about basic DM disease process . Provided patient with written educational materials related to hypo and hyperglycemia and importance of correct treatment . Reviewed scheduled/upcoming provider appointments including: endocrinologist . Advised patient, providing education and rationale, to check cbg 3 times a day and record, calling MD for findings outside established parameters.    Patient Self Care Activities:  . Self administers injectable DM medication [name]) as prescribed . Attends all scheduled provider appointments . Checks blood sugars as prescribed and utilize hyper and hypoglycemia protocol as needed  Initial goal documentation        [name], RN, MSN [place] Manager [phone]

## 2020-02-20 ENCOUNTER — Other Ambulatory Visit (HOSPITAL_COMMUNITY): Payer: Self-pay

## 2020-02-20 NOTE — Progress Notes (Signed)
Came out today for home visit at sch time, pt was not here like he said he would be. He had just left per family member right before I arrived. He went with someone to get a [place] car [place].  I did fill one week for him since he is leaving [place], but family member called him to tell him that and for the 2 wks he needs to be here. He is out of the eliquis.  I called pharmacy to refill it, it will be ready after 2pm today. I sent message to pt to let him know. Apparently he has been without it for a few wks now, he was suppose to go get it 2 wks ago after our last visit. I have advised him the dangers of missing the eliquis.  He now has the spiro bottle, not sure when or where he found it, but it was missing last time.  I did call his mail order pharmacy yesterday and they are mailing more of it for him and did a lost medicine claim for it.  Unknown of CBG's levels.  meds verified and one week pill box filled.   Marylouise Stacks, EMT-Paramedic  02/20/20

## 2020-02-28 ENCOUNTER — Ambulatory Visit: Payer: Medicare Other | Admitting: Endocrinology

## 2020-03-03 ENCOUNTER — Other Ambulatory Visit: Payer: Self-pay

## 2020-03-03 ENCOUNTER — Ambulatory Visit (INDEPENDENT_AMBULATORY_CARE_PROVIDER_SITE_OTHER): Payer: Medicare Other | Admitting: Endocrinology

## 2020-03-03 ENCOUNTER — Other Ambulatory Visit (HOSPITAL_COMMUNITY): Payer: Self-pay

## 2020-03-03 ENCOUNTER — Encounter: Payer: Self-pay | Admitting: Endocrinology

## 2020-03-03 VITALS — BP 124/80 | HR 104 | Ht 68.0 in | Wt 162.6 lb

## 2020-03-03 DIAGNOSIS — E1140 Type 2 diabetes mellitus with diabetic neuropathy, unspecified: Secondary | ICD-10-CM | POA: Diagnosis not present

## 2020-03-03 DIAGNOSIS — Z794 Long term (current) use of insulin: Secondary | ICD-10-CM

## 2020-03-03 LAB — POCT GLUCOSE (DEVICE FOR HOME USE): POC Glucose: 290 mg/dl — AB (ref 70–99)

## 2020-03-03 LAB — POCT GLYCOSYLATED HEMOGLOBIN (HGB A1C): Hemoglobin A1C: 9.9 % — AB (ref 4.0–5.6)

## 2020-03-03 MED ORDER — TRESIBA FLEXTOUCH 200 UNIT/ML ~~LOC~~ SOPN: 35.0000 [IU] | PEN_INJECTOR | Freq: Every day | SUBCUTANEOUS | 1 refills | Status: DC

## 2020-03-03 NOTE — Patient Instructions (Addendum)
Please increase the Tresiba to 35 units daily.  On this type of insulin, you should eat meals on a regular schedule.  If a meal is missed or significantly delayed, your blood sugar could go low.   check your blood sugar twice a day.  vary the time of day when you check, between before the 3 meals, and at bedtime.  also check if you have symptoms of your blood sugar being too high or too low.  please keep a record of the readings and bring it to your next appointment here (or you can bring the meter itself).  You can write it on any piece of paper.  please call us sooner if your blood sugar goes below 70, or if you have a lot of readings over 200. Please come back for a follow-up appointment in 6 weeks.

## 2020-03-03 NOTE — Progress Notes (Signed)
Subjective:    Patient ID: Calvin [name], male    DOB: 11-22-1960, 59 y.o.   MRN: [id number]  HPI Pt returns for f/u of diabetes mellitus:  DM type: Insulin-requiring type 2. Dx'ed: 7782 Complications: PN, CAD, CRI, and DR.   Therapy: insulin since soon after dx.  DKA: never Severe hypoglycemia: once, in 2021.   Pancreatitis: never Pancreatic imaging: normal on 2003 CT. SDOH: due to noncompliance, he is not a candidate for multiple daily injections; he is retired; he gets insulin from pt assistance; changes between brands must be minimized, due to LHL.  Other: up to early 2021, he was taking more than 200 units QD; pt requests that insulin be titrated up very slowly.   Interval history: no cbg record, but states cbg's are in the 200's-300's.  He says he takes insulin as rx'ed.   Past Medical History:  Diagnosis Date  . Adenoidal hypertrophy   . Adenomatous colon polyps 2012  . Arthritis   . Diabetes mellitus   . GERD (gastroesophageal reflux disease)   . Heart attack ([place])    While living in [place]  [name] Hx of adenomatous colonic polyps 08/18/2017  . Hyperlipidemia   . Hypertension   . Mild CAD    a. cath in 08/2017 showing mild nonobstructive CAD with scattered 20-30% stenosis.   . Nonischemic cardiomyopathy ([place])    a. EF 20-25% by echo in 09/2017 with cath showing mild CAD. b.  Last echo 12/2017 EF 35-40%, grade 2 DD.  [name] Obesity   . PAF (paroxysmal atrial fibrillation) ([place])   . Sleep apnea    cpap, pt says no longer has    Past Surgical History:  Procedure Laterality Date  . COLONOSCOPY  05/13/11   9 adenomas  . FOREARM SURGERY    . MUSCLE BIOPSY    . RIGHT/LEFT HEART CATH AND CORONARY ANGIOGRAPHY N/A 08/25/2017   Procedure: RIGHT/LEFT HEART CATH AND CORONARY ANGIOGRAPHY;  Surgeon: [name], MD;  Location: [facility];  Service: Cardiovascular;  Laterality: N/A;  . RIGHT/LEFT HEART CATH AND CORONARY ANGIOGRAPHY N/A 10/01/2019   Procedure:  RIGHT/LEFT HEART CATH AND CORONARY ANGIOGRAPHY;  Surgeon: [name], MD;  Location: [facility];  Service: Cardiovascular;  Laterality: N/A;    Social History   Socioeconomic History  . Marital status: Single    Spouse name: Not on file  . Number of children: 0  . Years of education: Not on file  . Highest education level: Not on file  Occupational History  . Occupation: Disability  Tobacco Use  . Smoking status: Never Smoker  . Smokeless tobacco: Never Used  Vaping Use  . Vaping Use: Never used  Substance and Sexual Activity  . Alcohol use: No  . Drug use: No  . Sexual activity: Yes    Birth control/protection: None  Other Topics Concern  . Not on file  Social History Narrative   Social History   Diet?    Do you drink/eat things with caffeine? yes   Marital status?       single      Do you live in a house, apartment, assisted living, condo, trailer, etc.? yes   Is it one or more stories? One story   How many persons live in your home?   Do you have any pets in your home? (please list)    Highest level of education completed? graduate   Do you exercise?  no                          Type & how often?   Advanced Directives   Do you have a living will? no   Do you have a DNR form?                                  If not, do you want to discuss one? no   Do you have signed POA/HPOA for forms? no      Functional Status   Do you have difficulty bathing or dressing yourself? no   Do you have difficulty preparing food or eating? no   Do you have difficulty managing your medications? no   Do you have difficulty managing your finances? no   Do you have difficulty affording your medications? no   Social Determinants of Health   Financial Resource Strain:   . Difficulty of Paying Living Expenses: Not on file  Food Insecurity: No Food Insecurity  . Worried About Charity fundraiser in the Last Year: Never true  . Ran Out of Food in the Last Year:  Never true  Transportation Needs: Unmet Transportation Needs  . Lack of Transportation (Medical): Yes  . Lack of Transportation (Non-Medical): Yes  Physical Activity:   . Days of Exercise per Week: Not on file  . Minutes of Exercise per Session: Not on file  Stress:   . Feeling of Stress : Not on file  Social Connections:   . Frequency of Communication with Friends and Family: Not on file  . Frequency of Social Gatherings with Friends and Family: Not on file  . Attends Religious Services: Not on file  . Active Member of Clubs or Organizations: Not on file  . Attends Archivist Meetings: Not on file  . Marital Status: Not on file  Intimate Partner Violence:   . Fear of Current or Ex-Partner: Not on file  . Emotionally Abused: Not on file  . Physically Abused: Not on file  . Sexually Abused: Not on file    Current Outpatient Medications on File Prior to Visit  Medication Sig Dispense Refill  . albuterol (VENTOLIN HFA) 108 (90 Base) MCG/ACT inhaler Inhale 2 puffs into the lungs every 6 (six) hours as needed for wheezing or shortness of breath. 6.7 g 0  . amitriptyline (ELAVIL) 10 MG tablet Take 2 tablets at bed time 180 tablet 3  . apixaban (ELIQUIS) 5 MG TABS tablet Take 1 tablet (5 mg total) by mouth 2 (two) times daily. 60 tablet 5  . carvedilol (COREG) 6.25 MG tablet Take 1 tablet (6.25 mg total) by mouth 2 (two) times daily with a meal. 180 tablet 3  . cyclobenzaprine (FLEXERIL) 10 MG tablet Take 1 tablet (10 mg total) by mouth 2 (two) times daily as needed for muscle spasms. 10 tablet 0  . digoxin (LANOXIN) 0.125 MG tablet Take 1 tablet (0.125 mg total) by mouth daily. 90 tablet 3  . donepezil (ARICEPT) 10 MG tablet Take 1 tablet (10 mg total) by mouth at bedtime. 90 tablet 3  . Evolocumab (REPATHA SURECLICK) 315 MG/ML SOAJ Inject 1 pen into the skin every 14 (fourteen) days. 6 pen 3  . ezetimibe (ZETIA) 10 MG tablet Take 1 tablet (10 mg total) by mouth daily. 90 tablet  3  . Insulin Syringe-Needle U-100 (INSULIN SYRINGE .3CC/29GX1/2")  29G X 1/2" 0.3 ML MISC Inject 1 Syringe 3 (three) times daily as directed. Check blood sugar three times daily. Dx: E11.9 (Patient taking differently: Inject 1 Syringe as directed See admin instructions. Check blood sugar three times daily. Dx: E11.9 ) 100 each 3  . Lancets (ONETOUCH DELICA PLUS [id number]) MISC USE TWICE DAILY 200 each 3  . lidocaine (LMX) 4 % cream Apply 1 application topically 3 (three) times daily as needed. 30 g 0  . lisinopril (ZESTRIL) 10 MG tablet Take 1 tablet (10 mg total) by mouth at bedtime. 90 tablet 3  . nitroGLYCERIN (NITROSTAT) 0.4 MG SL tablet Place 1 tablet (0.4 mg total) under the tongue every 5 (five) minutes as needed for chest pain. 30 tablet 1  . ONETOUCH ULTRA test strip USE TWICE DAILY 200 strip 3  . polyethylene glycol (MIRALAX / GLYCOLAX) 17 g packet Take 17 g by mouth 2 (two) times daily. 14 each 0  . senna (SENOKOT) 8.6 MG TABS tablet Take 1 tablet (8.6 mg total) by mouth daily as needed for mild constipation. 15 tablet 0  . spironolactone (ALDACTONE) 25 MG tablet Take 25 mg by mouth daily.      No current facility-administered medications on file prior to visit.    No Known Allergies  Family History  Problem Relation Age of Onset  . Heart disease Mother   . Heart attack Mother 7  . Hypertension Mother   . Hyperlipidemia Mother   . Diabetes Mother   . Heart disease Father   . Heart attack Father 41  . Hypertension Father   . Hyperlipidemia Father   . Diabetes Father   . Heart attack Sister 75  . Colon cancer Neg Hx   . Stomach cancer Neg Hx   . Esophageal cancer Neg Hx   . Rectal cancer Neg Hx     BP 124/80   Pulse (!) 104   Ht 5' 8"  (1.727 m)   Wt 162 lb 9.6 oz (73.8 kg)   SpO2 98%   BMI 24.72 kg/m    Review of Systems He denies hypoglycemia.  No recent weight change.  Denies n/v.     Objective:   Physical Exam VITAL SIGNS:  See vs page GENERAL: no  distress Pulses: dorsalis pedis intact bilat.   MSK: no deformity of the feet CV: no leg edema Skin:  no ulcer on the feet.  normal color and temp on the feet. Neuro: sensation is intact to touch on the feet  Lab Results  Component Value Date   HGBA1C 9.9 (A) 03/03/2020   Lab Results  Component Value Date   CREATININE 1.26 (H) 01/20/2020   BUN 11 01/20/2020   NA 140 01/20/2020   K 4.1 01/20/2020   CL 108 01/20/2020   CO2 22 01/20/2020       Assessment & Plan:  Insulin-requiring type 2 DM, with stage 3 CRI: uncontrolled  Patient Instructions  Please increase the Tresiba to 35 units daily.  On this type of insulin, you should eat meals on a regular schedule.  If a meal is missed or significantly delayed, your blood sugar could go low.   check your blood sugar twice a day.  vary the time of day when you check, between before the 3 meals, and at bedtime.  also check if you have symptoms of your blood sugar being too high or too low.  please keep a record of the readings and bring it to your next appointment  here (or you can bring the meter itself).  You can write it on any piece of paper.  please call us sooner if your blood sugar goes below 70, or if you have a lot of readings over 200. Please come back for a follow-up appointment in 6 weeks.

## 2020-03-03 NOTE — Progress Notes (Signed)
Pt presents for f/u appt today with Dr. Loanne Drilling for diabetes. While entering exam room, pt states, "I don't feel good". Asked tp to further elaborate about his symptoms. C/o dizziness, headache and dyspnea. CBG obtained with results of 290. Dr. Loanne Drilling made aware and states he will further eval.

## 2020-03-03 NOTE — Progress Notes (Signed)
Paramedicine Encounter    Patient ID: Calvin [name], male    DOB: 1961-04-26, 59 y.o.   MRN: [id number]   [name], NP as PCP - General (Geriatric Medicine) [name], MD as PCP - Cardiology (Cardiology) [name], [name], MD as PCP - Advanced Heart Failure (Cardiology) [name], MD as Consulting Physician (Endocrinology) [name], RN as [place] Management  Patient Active Problem List   Diagnosis Date Noted  . Hypoglycemia due to insulin 10/15/2019  . Chronic anticoagulation 10/15/2019  . COVID-19 virus detected 06/18/2019  . Right sided weakness 06/17/2019  . AKI (acute kidney injury) ([place]) 06/17/2019  . Foot pain, bilateral 02/01/2019  . Abdominal pain 01/23/2019  . Chest pain 01/23/2019  . Right leg pain 12/20/2018  . Overgrown toenails 12/20/2018  . Unsteady gait 12/20/2018  . Encounter for therapeutic drug monitoring 10/22/2018  . Nausea and vomiting 03/10/2018  . AF (paroxysmal atrial fibrillation) ([place]) 03/10/2018  . Non-cardiac chest pain 03/09/2018  . Diabetic neuropathy ([place]) 09/26/2017  . MCI (mild cognitive impairment) 06/23/2017  . OSA on CPAP 05/10/2017  . Gastroesophageal reflux disease 05/10/2017  . Insomnia 05/10/2017  . Onychomycosis of multiple toenails with type 2 diabetes mellitus and peripheral neuropathy ([place]) 05/10/2017  . Chronic systolic heart failure ([place]) 11/22/2015  . Essential hypertension 11/22/2015  . DM (diabetes mellitus) ([place]) 11/20/2015  . HLD (hyperlipidemia) 11/20/2015  . Hx of adenomatous colonic polyps 05/19/2011    Current Outpatient Medications:  .  albuterol (VENTOLIN HFA) 108 (90 Base) MCG/ACT inhaler, Inhale 2 puffs into the lungs every 6 (six) hours as needed for wheezing or shortness of breath., Disp: 6.7 g, Rfl: 0 .  amitriptyline (ELAVIL) 10 MG tablet, Take 2 tablets at bed time, Disp: 180 tablet, Rfl: 3 .  apixaban (ELIQUIS) 5 MG TABS tablet,  Take 1 tablet (5 mg total) by mouth 2 (two) times daily., Disp: 60 tablet, Rfl: 5 .  carvedilol (COREG) 6.25 MG tablet, Take 1 tablet (6.25 mg total) by mouth 2 (two) times daily with a meal., Disp: 180 tablet, Rfl: 3 .  digoxin (LANOXIN) 0.125 MG tablet, Take 1 tablet (0.125 mg total) by mouth daily., Disp: 90 tablet, Rfl: 3 .  donepezil (ARICEPT) 10 MG tablet, Take 1 tablet (10 mg total) by mouth at bedtime., Disp: 90 tablet, Rfl: 3 .  ezetimibe (ZETIA) 10 MG tablet, Take 1 tablet (10 mg total) by mouth daily., Disp: 90 tablet, Rfl: 3 .  insulin degludec (TRESIBA FLEXTOUCH) 200 UNIT/ML FlexTouch Pen, Inject 30 Units into the skin daily., Disp: 15 mL, Rfl: 1 .  Insulin Syringe-Needle U-100 (INSULIN SYRINGE .3CC/29GX1/2") 29G X 1/2" 0.3 ML MISC, Inject 1 Syringe 3 (three) times daily as directed. Check blood sugar three times daily. Dx: E11.9 (Patient taking differently: Inject 1 Syringe as directed See admin instructions. Check blood sugar three times daily. Dx: E11.9 ), Disp: 100 each, Rfl: 3 .  Lancets (ONETOUCH DELICA PLUS HMCNOB09G) MISC, USE TWICE DAILY, Disp: 200 each, Rfl: 3 .  lidocaine (LMX) 4 % cream, Apply 1 application topically 3 (three) times daily as needed., Disp: 30 g, Rfl: 0 .  lisinopril (ZESTRIL) 10 MG tablet, Take 1 tablet (10 mg total) by mouth at bedtime., Disp: 90 tablet, Rfl: 3 .  ONETOUCH ULTRA test strip, USE TWICE DAILY, Disp: 200 strip, Rfl: 3 .  polyethylene glycol (MIRALAX / GLYCOLAX) 17 g packet, Take 17 g by mouth 2 (two) times daily., Disp: 14  each, Rfl: 0 .  senna (SENOKOT) 8.6 MG TABS tablet, Take 1 tablet (8.6 mg total) by mouth daily as needed for mild constipation., Disp: 15 tablet, Rfl: 0 .  spironolactone (ALDACTONE) 25 MG tablet, Take 25 mg by mouth daily. , Disp: , Rfl:  .  cyclobenzaprine (FLEXERIL) 10 MG tablet, Take 1 tablet (10 mg total) by mouth 2 (two) times daily as needed for muscle spasms. (Patient not taking: Reported on 02/04/2020), Disp: 10 tablet,  Rfl: 0 .  Evolocumab (REPATHA SURECLICK) 213 MG/ML SOAJ, Inject 1 pen into the skin every 14 (fourteen) days. (Patient not taking: Reported on 03/03/2020), Disp: 6 pen, Rfl: 3 .  nitroGLYCERIN (NITROSTAT) 0.4 MG SL tablet, Place 1 tablet (0.4 mg total) under the tongue every 5 (five) minutes as needed for chest pain. (Patient not taking: Reported on 02/04/2020), Disp: 30 tablet, Rfl: 1 No Known Allergies    Social History   Socioeconomic History  . Marital status: Single    Spouse name: Not on file  . Number of children: 0  . Years of education: Not on file  . Highest education level: Not on file  Occupational History  . Occupation: Disability  Tobacco Use  . Smoking status: Never Smoker  . Smokeless tobacco: Never Used  Vaping Use  . Vaping Use: Never used  Substance and Sexual Activity  . Alcohol use: No  . Drug use: No  . Sexual activity: Yes    Birth control/protection: None  Other Topics Concern  . Not on file  Social History Narrative   Social History   Diet?    Do you drink/eat things with caffeine? yes   Marital status?       single      Do you live in a house, apartment, assisted living, condo, trailer, etc.? yes   Is it one or more stories? One story   How many persons live in your home?   Do you have any pets in your home? (please list)    Highest level of education completed? graduate   Do you exercise?            no                          Type & how often?   Advanced Directives   Do you have a living will? no   Do you have a DNR form?                                  If not, do you want to discuss one? no   Do you have signed POA/HPOA for forms? no      Functional Status   Do you have difficulty bathing or dressing yourself? no   Do you have difficulty preparing food or eating? no   Do you have difficulty managing your medications? no   Do you have difficulty managing your finances? no   Do you have difficulty affording your medications? no   Social  Determinants of Health   Financial Resource Strain:   . Difficulty of Paying Living Expenses: Not on file  Food Insecurity: No Food Insecurity  . Worried About Charity fundraiser in the Last Year: Never true  . Ran Out of Food in the Last Year: Never true  Transportation Needs: Unmet Transportation Needs  . Lack of Transportation (Medical): Yes  .  Lack of Transportation (Non-Medical): Yes  Physical Activity:   . Days of Exercise per Week: Not on file  . Minutes of Exercise per Session: Not on file  Stress:   . Feeling of Stress : Not on file  Social Connections:   . Frequency of Communication with Friends and Family: Not on file  . Frequency of Social Gatherings with Friends and Family: Not on file  . Attends Religious Services: Not on file  . Active Member of Clubs or Organizations: Not on file  . Attends Archivist Meetings: Not on file  . Marital Status: Not on file  Intimate Partner Violence:   . Fear of Current or Ex-Partner: Not on file  . Emotionally Abused: Not on file  . Physically Abused: Not on file  . Sexually Abused: Not on file    Physical Exam      Future Appointments  Date Time Provider [place]  03/03/2020  4:15 PM [name], MD [facility] None  03/06/2020 10:30 AM [name], RN [facility] None  03/18/2020 11:30 AM Cameron Sprang, MD [facility] None  04/24/2020  3:15 PM Felipa Furnace, DPM [facility]  05/25/2020 11:30 AM [name], Carlos American, NP [facility] None    BP 138/78   Pulse (!) 108   Temp 98.7 F (37.1 C)   Resp 18   Wt 167 lb (75.8 kg)   SpO2 99%   BMI 25.39 kg/m   Weight yesterday-167 Last visit weight-167 CBG's >400 consistently  Pt reports he has been having more h/a and dizziness. His CBG's are routinely high. He states he is trying to limit sugar in his diet.  He denies c/p, no sob.  He has appoint today for his diabetes.  meds verified.  He filled his 2 pill boxes  There were some errors in  there--too many in am dose and he left out eliquis in the pm dose.  Will continue to keep working on this with him.  His HR was elevated, regular rhythm-he is going to doc here shortly.  Appetite comes and goes.   Marylouise Stacks, [facility] Paramedic  03/03/20

## 2020-03-05 ENCOUNTER — Encounter (INDEPENDENT_AMBULATORY_CARE_PROVIDER_SITE_OTHER): Payer: Medicare Other | Admitting: Ophthalmology

## 2020-03-06 ENCOUNTER — Other Ambulatory Visit: Payer: Self-pay | Admitting: *Deleted

## 2020-03-06 NOTE — Patient Outreach (Signed)
[facility]) Care Management  03/06/2020  Calvin [name] 05/22/1961 [id number]   Call placed to member to follow up on management of diabetes.  Report he is still struggling to manage, blood sugar today was 355.  State he has still been getting readings as high as 400.  He was seen by endocrinologist on 8/31, Tresiba increased to 35 units daily.  He is hoping this change along with managing his diet will help decrease daily readings and ultimately continue to decrease his A1C.  Most recent A1C decreased from 10.1 to 9.9.  Continue to report decreased appetite but verbalizes understanding regarding need for well balanced diet.  Denies any urgent concerns, encouraged to contact this care manager with questions.  Goals Addressed              This Visit's Progress   .  Get my blood sugars better (pt-stated)   On track     [place] (see longtitudinal plan of care for additional care plan information)  Objective:  Lab Results  Component Value Date   HGBA1C 10.1 (A) 01/15/2020 .   Lab Results  Component Value Date   CREATININE 1.26 (H) 01/20/2020   CREATININE 1.53 (H) 12/24/2019   CREATININE 1.24 10/25/2019 .   [name] No results found for: EGFR  Current Barriers:  [name] Knowledge Deficits related to basic Diabetes pathophysiology and self care/management . Knowledge Deficits related to medications used for management of diabetes  Case Manager Clinical Goal(s):  Over the next 60 days, patient will demonstrate improved adherence to prescribed treatment plan for diabetes self care/management as evidenced by: decreased A1C </= 8 . Verbalize daily monitoring and recording of CBG within 28 days . Verbalize adherence to ADA/ carb modified diet within the next 28 days . Verbalize adherence to prescribed medication regimen within the next 28 days   Interventions:  . Provided education to patient about basic DM disease process . Provided patient with written educational  materials related to hypo and hyperglycemia and importance of correct treatment . Reviewed scheduled/upcoming provider appointments including: endocrinologist . Advised patient, providing education and rationale, to check cbg 3 times a day and record, calling MD for findings outside established parameters.    Patient Self Care Activities:  . Self administers injectable DM medication [name]) as prescribed . Attends all scheduled provider appointments . Checks blood sugars as prescribed and utilize hyper and hypoglycemia protocol as needed  Initial goal documentation       [name], RN, MSN [place] Manager [phone]

## 2020-03-16 ENCOUNTER — Other Ambulatory Visit (HOSPITAL_COMMUNITY): Payer: Self-pay

## 2020-03-16 NOTE — Progress Notes (Signed)
Paramedicine Encounter    Patient ID: Calvin [name], male    DOB: 07-Nov-1960, 59 y.o.   MRN: [id number]   [name], NP as PCP - General (Geriatric Medicine) [name], MD as PCP - Cardiology (Cardiology) [name], [name], MD as PCP - Advanced Heart Failure (Cardiology) [name], MD as Consulting Physician (Endocrinology) [name], RN as [place] Management  Patient Active Problem List   Diagnosis Date Noted  . Hypoglycemia due to insulin 10/15/2019  . Chronic anticoagulation 10/15/2019  . COVID-19 virus detected 06/18/2019  . Right sided weakness 06/17/2019  . AKI (acute kidney injury) ([place]) 06/17/2019  . Foot pain, bilateral 02/01/2019  . Abdominal pain 01/23/2019  . Chest pain 01/23/2019  . Right leg pain 12/20/2018  . Overgrown toenails 12/20/2018  . Unsteady gait 12/20/2018  . Encounter for therapeutic drug monitoring 10/22/2018  . Nausea and vomiting 03/10/2018  . AF (paroxysmal atrial fibrillation) ([place]) 03/10/2018  . Non-cardiac chest pain 03/09/2018  . Diabetic neuropathy ([place]) 09/26/2017  . MCI (mild cognitive impairment) 06/23/2017  . OSA on CPAP 05/10/2017  . Gastroesophageal reflux disease 05/10/2017  . Insomnia 05/10/2017  . Onychomycosis of multiple toenails with type 2 diabetes mellitus and peripheral neuropathy ([place]) 05/10/2017  . Chronic systolic heart failure ([place]) 11/22/2015  . Essential hypertension 11/22/2015  . DM (diabetes mellitus) ([place]) 11/20/2015  . HLD (hyperlipidemia) 11/20/2015  . Hx of adenomatous colonic polyps 05/19/2011    Current Outpatient Medications:  .  albuterol (VENTOLIN HFA) 108 (90 Base) MCG/ACT inhaler, Inhale 2 puffs into the lungs every 6 (six) hours as needed for wheezing or shortness of breath., Disp: 6.7 g, Rfl: 0 .  amitriptyline (ELAVIL) 10 MG tablet, Take 2 tablets at bed time, Disp: 180 tablet, Rfl: 3 .  apixaban (ELIQUIS) 5 MG TABS tablet,  Take 1 tablet (5 mg total) by mouth 2 (two) times daily., Disp: 60 tablet, Rfl: 5 .  carvedilol (COREG) 6.25 MG tablet, Take 1 tablet (6.25 mg total) by mouth 2 (two) times daily with a meal., Disp: 180 tablet, Rfl: 3 .  cyclobenzaprine (FLEXERIL) 10 MG tablet, Take 1 tablet (10 mg total) by mouth 2 (two) times daily as needed for muscle spasms., Disp: 10 tablet, Rfl: 0 .  digoxin (LANOXIN) 0.125 MG tablet, Take 1 tablet (0.125 mg total) by mouth daily., Disp: 90 tablet, Rfl: 3 .  donepezil (ARICEPT) 10 MG tablet, Take 1 tablet (10 mg total) by mouth at bedtime., Disp: 90 tablet, Rfl: 3 .  Evolocumab (REPATHA SURECLICK) 355 MG/ML SOAJ, Inject 1 pen into the skin every 14 (fourteen) days., Disp: 6 pen, Rfl: 3 .  ezetimibe (ZETIA) 10 MG tablet, Take 1 tablet (10 mg total) by mouth daily., Disp: 90 tablet, Rfl: 3 .  insulin degludec (TRESIBA FLEXTOUCH) 200 UNIT/ML FlexTouch Pen, Inject 36 Units into the skin daily., Disp: 15 mL, Rfl: 1 .  Insulin Syringe-Needle U-100 (INSULIN SYRINGE .3CC/29GX1/2") 29G X 1/2" 0.3 ML MISC, Inject 1 Syringe 3 (three) times daily as directed. Check blood sugar three times daily. Dx: E11.9 (Patient taking differently: Inject 1 Syringe as directed See admin instructions. Check blood sugar three times daily. Dx: E11.9 ), Disp: 100 each, Rfl: 3 .  Lancets (ONETOUCH DELICA PLUS [id number]) MISC, USE TWICE DAILY, Disp: 200 each, Rfl: 3 .  lidocaine (LMX) 4 % cream, Apply 1 application topically 3 (three) times daily as needed., Disp: 30 g, Rfl: 0 .  lisinopril (ZESTRIL) 10  MG tablet, Take 1 tablet (10 mg total) by mouth at bedtime., Disp: 90 tablet, Rfl: 3 .  nitroGLYCERIN (NITROSTAT) 0.4 MG SL tablet, Place 1 tablet (0.4 mg total) under the tongue every 5 (five) minutes as needed for chest pain., Disp: 30 tablet, Rfl: 1 .  ONETOUCH ULTRA test strip, USE TWICE DAILY, Disp: 200 strip, Rfl: 3 .  polyethylene glycol (MIRALAX / GLYCOLAX) 17 g packet, Take 17 g by mouth 2 (two) times  daily., Disp: 14 each, Rfl: 0 .  senna (SENOKOT) 8.6 MG TABS tablet, Take 1 tablet (8.6 mg total) by mouth daily as needed for mild constipation., Disp: 15 tablet, Rfl: 0 .  spironolactone (ALDACTONE) 25 MG tablet, Take 25 mg by mouth daily. , Disp: , Rfl:  No Known Allergies    Social History   Socioeconomic History  . Marital status: Single    Spouse name: Not on file  . Number of children: 0  . Years of education: Not on file  . Highest education level: Not on file  Occupational History  . Occupation: Disability  Tobacco Use  . Smoking status: Never Smoker  . Smokeless tobacco: Never Used  Vaping Use  . Vaping Use: Never used  Substance and Sexual Activity  . Alcohol use: No  . Drug use: No  . Sexual activity: Yes    Birth control/protection: None  Other Topics Concern  . Not on file  Social History Narrative   Social History   Diet?    Do you drink/eat things with caffeine? yes   Marital status?       single      Do you live in a house, apartment, assisted living, condo, trailer, etc.? yes   Is it one or more stories? One story   How many persons live in your home?   Do you have any pets in your home? (please list)    Highest level of education completed? graduate   Do you exercise?            no                          Type & how often?   Advanced Directives   Do you have a living will? no   Do you have a DNR form?                                  If not, do you want to discuss one? no   Do you have signed POA/HPOA for forms? no      Functional Status   Do you have difficulty bathing or dressing yourself? no   Do you have difficulty preparing food or eating? no   Do you have difficulty managing your medications? no   Do you have difficulty managing your finances? no   Do you have difficulty affording your medications? no   Social Determinants of Health   Financial Resource Strain:   . Difficulty of Paying Living Expenses: Not on file  Food Insecurity: No  Food Insecurity  . Worried About Charity fundraiser in the Last Year: Never true  . Ran Out of Food in the Last Year: Never true  Transportation Needs: Unmet Transportation Needs  . Lack of Transportation (Medical): Yes  . Lack of Transportation (Non-Medical): Yes  Physical Activity:   . Days of Exercise per Week: Not  on file  . Minutes of Exercise per Session: Not on file  Stress:   . Feeling of Stress : Not on file  Social Connections:   . Frequency of Communication with Friends and Family: Not on file  . Frequency of Social Gatherings with Friends and Family: Not on file  . Attends Religious Services: Not on file  . Active Member of Clubs or Organizations: Not on file  . Attends Archivist Meetings: Not on file  . Marital Status: Not on file  Intimate Partner Violence:   . Fear of Current or Ex-Partner: Not on file  . Emotionally Abused: Not on file  . Physically Abused: Not on file  . Sexually Abused: Not on file    Physical Exam      Future Appointments  Date Time Provider [place]  03/18/2020 11:30 AM Cameron Sprang, MD [facility] None  03/19/2020  8:15 AM [name], MD [facility] None  04/15/2020  2:30 PM [name], MD [facility] None  04/17/2020  9:30 AM [name], RN [facility] None  04/24/2020  3:15 PM Felipa Furnace, DPM [facility]  04/27/2020  9:20 AM [name], [name], MD [facility] None  05/25/2020 11:30 AM [name], NP [facility] None    BP 132/80   Pulse 98   Temp 98.4 F (36.9 C)   Resp 18   Wt 167 lb (75.8 kg)   SpO2 98%   BMI 25.39 kg/m  CBG PTA-320 Weight yesterday-168 Last visit weight-167  Pt reports he is doing ok, not always feeling the best. He stays busy watching his godkids a lot and going back and forth. Struggling to get his CBG's under control. He states he has been taking the increased insulin but cannot see any change. Appetite so-so. I think his food choices plays a big part of his  uncontrolled CBG's. Advised him to limit the breads, noodles, pastas-he states he doesn't eat that much of it. He does partake in a lot of juices/drinks, advised him to get diet/sugar free juices.  meds verified and pill boxes refilled. He is not comfortable filling own pill boxes yet. Still working with him on that. He ran out of the eliquis, had enough to do one week, he said he had another bottle at his house and will place where needed.   Marylouise Stacks, [place] [facility] Paramedic  03/16/20

## 2020-03-18 ENCOUNTER — Ambulatory Visit: Payer: Medicare Other | Admitting: Neurology

## 2020-03-18 NOTE — Progress Notes (Signed)
[facility] Note  03/19/2020     CHIEF COMPLAINT Patient presents for Retina Evaluation   HISTORY OF PRESENT ILLNESS: Calvin [name] is a 59 y.o. male who presents to the clinic today for:   HPI    Retina Evaluation    This started 1.5 years ago.  Associated Symptoms Glare and Weight Loss.  I, the attending physician,  performed the HPI with the patient and updated documentation appropriately.          Comments    Patient here for retina evaluation referred by Dr. [name]. Patient states vision bad. Hard to see at night. During day vision blurry. Tried using reading glasses everything blurry. Last saw an eye dr was 5 years ago. No eye pain . Eye gets watery.  Blood sugar this am was 245 sometimes higher than this. Doesn't know A1C. Pt does drive depending on how he feels (if he had a headache or feels bad he does not).  Poor vision started about 5-6 years ago.  Last eye doctor told him the "sugar" is what is causing the decrease in vision.       Last edited by [name], MD on 03/19/2020 10:55 AM. (History)    last A1c: 9.9 on 08.31.21, pt is here on the referral of his PCP, Sherrie Mustache, NP, for DM exam, pt states it has been several years since he saw on eye dr, pt states his blood sugar is running between 300-400, he states he does not check his BP at home (was 132/80 on 09.13.21), pt states vision is blurry  Referring physician: [name], NP [place],  [place] [address]  HISTORICAL INFORMATION:   Selected notes from the MEDICAL RECORD NUMBER Referred by Dr. [name] Uncontrolled DM II on insulin A1C: 10.1    CURRENT MEDICATIONS: No current outpatient medications on file. (Ophthalmic Drugs)   No current facility-administered medications for this visit. (Ophthalmic Drugs)   Current Outpatient Medications (Other)  Medication Sig  . albuterol (VENTOLIN HFA) 108 (90 Base) MCG/ACT inhaler Inhale 2 puffs into the  lungs every 6 (six) hours as needed for wheezing or shortness of breath.  [name] amitriptyline (ELAVIL) 10 MG tablet Take 2 tablets at bed time  . apixaban (ELIQUIS) 5 MG TABS tablet Take 1 tablet (5 mg total) by mouth 2 (two) times daily.  . carvedilol (COREG) 6.25 MG tablet Take 1 tablet (6.25 mg total) by mouth 2 (two) times daily with a meal.  . cyclobenzaprine (FLEXERIL) 10 MG tablet Take 1 tablet (10 mg total) by mouth 2 (two) times daily as needed for muscle spasms.  . digoxin (LANOXIN) 0.125 MG tablet Take 1 tablet (0.125 mg total) by mouth daily.  [name] donepezil (ARICEPT) 10 MG tablet Take 1 tablet (10 mg total) by mouth at bedtime.  . Evolocumab (REPATHA SURECLICK) 297 MG/ML SOAJ Inject 1 pen into the skin every 14 (fourteen) days.  [name] ezetimibe (ZETIA) 10 MG tablet Take 1 tablet (10 mg total) by mouth daily.  . insulin degludec (TRESIBA FLEXTOUCH) 200 UNIT/ML FlexTouch Pen Inject 36 Units into the skin daily.  . Insulin Syringe-Needle U-100 (INSULIN SYRINGE .3CC/29GX1/2") 29G X 1/2" 0.3 ML MISC Inject 1 Syringe 3 (three) times daily as directed. Check blood sugar three times daily. Dx: E11.9 (Patient taking differently: Inject 1 Syringe as directed See admin instructions. Check blood sugar three times daily. Dx: E11.9 )  . Lancets (ONETOUCH DELICA PLUS [id number]) MISC USE TWICE  DAILY  . lidocaine (LMX) 4 % cream Apply 1 application topically 3 (three) times daily as needed.  [name] lisinopril (ZESTRIL) 10 MG tablet Take 1 tablet (10 mg total) by mouth at bedtime.  . nitroGLYCERIN (NITROSTAT) 0.4 MG SL tablet Place 1 tablet (0.4 mg total) under the tongue every 5 (five) minutes as needed for chest pain.  [name] ULTRA test strip USE TWICE DAILY  . polyethylene glycol (MIRALAX / GLYCOLAX) 17 g packet Take 17 g by mouth 2 (two) times daily.  [name] senna (SENOKOT) 8.6 MG TABS tablet Take 1 tablet (8.6 mg total) by mouth daily as needed for mild constipation.  [name] spironolactone (ALDACTONE) 25 MG tablet Take  25 mg by mouth daily.    No current facility-administered medications for this visit. (Other)      REVIEW OF SYSTEMS: ROS    Positive for: Endocrine, Cardiovascular, Eyes   Last edited by [name], COA on 03/19/2020  8:59 AM. (History)       ALLERGIES No Known Allergies  PAST MEDICAL HISTORY Past Medical History:  Diagnosis Date  . Adenoidal hypertrophy   . Adenomatous colon polyps 2012  . Arthritis   . Diabetes mellitus   . GERD (gastroesophageal reflux disease)   . Heart attack ([place])    [place] living in [place]  [name] Hx of adenomatous colonic polyps 08/18/2017  . Hyperlipidemia   . Hypertension   . Mild CAD    a. cath in 08/2017 showing mild nonobstructive CAD with scattered 20-30% stenosis.   . Nonischemic cardiomyopathy ([place])    a. EF 20-25% by echo in 09/2017 with cath showing mild CAD. b.  Last echo 12/2017 EF 35-40%, grade 2 DD.  [name] Obesity   . PAF (paroxysmal atrial fibrillation) ([place])   . Sleep apnea    cpap, pt says no longer has   Past Surgical History:  Procedure Laterality Date  . COLONOSCOPY  05/13/11   9 adenomas  . FOREARM SURGERY    . MUSCLE BIOPSY    . RIGHT/LEFT HEART CATH AND CORONARY ANGIOGRAPHY N/A 08/25/2017   Procedure: RIGHT/LEFT HEART CATH AND CORONARY ANGIOGRAPHY;  Surgeon: [name], MD;  Location: [facility];  Service: Cardiovascular;  Laterality: N/A;  . RIGHT/LEFT HEART CATH AND CORONARY ANGIOGRAPHY N/A 10/01/2019   Procedure: RIGHT/LEFT HEART CATH AND CORONARY ANGIOGRAPHY;  Surgeon: [name], MD;  Location: [facility];  Service: Cardiovascular;  Laterality: N/A;    FAMILY HISTORY Family History  Problem Relation Age of Onset  . Heart disease Mother   . Heart attack Mother 21  . Hypertension Mother   . Hyperlipidemia Mother   . Diabetes Mother   . Heart disease Father   . Heart attack Father 82  . Hypertension Father   . Hyperlipidemia Father   . Diabetes Father   . Heart attack Sister  59  . Colon cancer Neg Hx   . Stomach cancer Neg Hx   . Esophageal cancer Neg Hx   . Rectal cancer Neg Hx     SOCIAL HISTORY Social History   Tobacco Use  . Smoking status: Never Smoker  . Smokeless tobacco: Never Used  Vaping Use  . Vaping Use: Never used  Substance Use Topics  . Alcohol use: No  . Drug use: No         OPHTHALMIC EXAM:  Base Eye Exam    Visual Acuity (Snellen - Linear)      Right Left  Dist [place] 20/200 +1 20/350   Dist ph [place] NI NI  Vision is fluctuating from 20/400 to what is entered in computer when checking vision. Becky started pts work up and states pt was able to see 20/40 OU.   Myself and Caryl Pina both checked vision and was unable to get any better than what is in chart now.       Tonometry (Tonopen, 8:54 AM)      Right Left   Pressure 16 12       Pupils      Dark Light Shape React APD   Right 3 2 Round Minimal None   Left 3 2 Round Minimal None       Visual Fields (Counting fingers)      Left Right    Full Full       Extraocular Movement      Right Left    Full, Ortho Full, Ortho       Neuro/Psych    Oriented x3: Yes   Mood/Affect: Normal       Dilation    Both eyes: 1.0% Mydriacyl, 2.5% Phenylephrine @ 8:54 AM        Slit Lamp and Fundus Exam    Slit Lamp Exam      Right Left   Lids/Lashes Dermatochalasis - upper lid, Meibomian gland dysfunction Dermatochalasis - upper lid, Meibomian gland dysfunction   Conjunctiva/Sclera nasal and temporal pinguecula, Melanosis nasal and temporal pinguecula, Melanosis   Cornea arcus, 2-3+ Punctate epithelial erosions arcus, 3+ Punctate epithelial erosions   Anterior Chamber Deep and quiet Deep and quiet   Iris Round and dilated, No NVI Round and dilated, No NVI   Lens 2-3+ Nuclear sclerosis, 2-3+ Cortical cataract 2-3+ Nuclear sclerosis, 2-3+ Cortical cataract   Vitreous Vitreous syneresis Vitreous syneresis       Fundus Exam      Right Left   Disc Pink and Sharp, no NVD Pink  and Sharp, no NVD   C/D Ratio 0.2 0.2   Macula Blunted foveal reflex, severe central edema, scattered exudates Blunted foveal reflex, massive central edema with severe exudates nasal to fovea, scattered IRH   Vessels Dilated and tortuous, Copper wiring, AV crossing changes, +IRMA Dilated and tortuous, Copper wiring, AV crossing changes   Periphery Attached, scattered IRH/DBH, exudate greatest posteriorly    Attached, scattered IRH/DBH, exudate greatest posteriorly           Refraction    Manifest Refraction      Sphere Dist [place]   Right [place] NI   Left [place] NI  NI with refration +/-3.00 NI with retinoscopy- pt was taking -0.25 OU          IMAGING AND PROCEDURES  Imaging and Procedures for 03/19/2020  OCT, Retina - OU - Both Eyes       Right Eye Quality was good. Central Foveal Thickness: 597. Progression has no prior data. Findings include abnormal foveal contour, intraretinal fluid, intraretinal hyper-reflective material, subretinal hyper-reflective material, subretinal fluid, vitreomacular adhesion  (Massive central DME).   Left Eye Quality was good. Central Foveal Thickness: 721. Progression has no prior data. Findings include abnormal foveal contour, intraretinal fluid, intraretinal hyper-reflective material, subretinal fluid (Massive central DME).   Notes *Images captured and stored on drive  Diagnosis / Impression:  Massive central DME OU (OS > OD)  Clinical management:  See below  Abbreviations: NFP - Normal foveal profile. CME - cystoid macular edema. PED - pigment epithelial detachment. IRF - intraretinal  fluid. SRF - subretinal fluid. EZ - ellipsoid zone. ERM - epiretinal membrane. ORA - outer retinal atrophy. ORT - outer retinal tubulation. SRHM - subretinal hyper-reflective material. IRHM - intraretinal hyper-reflective material        Fluorescein Angiography Optos (Transit OS)       Right Eye   Progression has no prior data. Early phase findings  include vascular perfusion defect, microaneurysm. Mid/Late phase findings include vascular perfusion defect, microaneurysm, leakage (Very leaky MA posterior pole, mild perivascular leakage temporal periphery; no NV).   Left Eye   Progression has no prior data. Early phase findings include microaneurysm, vascular perfusion defect. Mid/Late phase findings include leakage, microaneurysm, vascular perfusion defect (Very leaky MA posterior pole, mild perivascular leakage temporal periphery; no NV).   Notes **Images stored on drive**  Impression: Severe NPDR OU Late leaking MA greatest posteriorly Patches of peripheral non-perfusion greatest temporally OU Mild, late perivascular leakage temporal periphery OU No NV OU          Intravitreal Injection, Pharmacologic Agent - OS - Left Eye       Time Out 03/19/2020. 11:09 AM. Confirmed correct patient, procedure, site, and patient consented.   Anesthesia Topical anesthesia was used. Anesthetic medications included Lidocaine 2%, Proparacaine 0.5%.   Procedure Preparation included 5% betadine to ocular surface, eyelid speculum. A (32g) needle was used.   Injection:  1.25 mg Bevacizumab (AVASTIN) SOLN   NDC: 16967-893-81, Lot: [id number] , Expiration date: 05/12/2020   Route: Intravitreal, Site: Left Eye, Waste: 0.05 mL  Post-op Post injection exam found visual acuity of at least counting fingers. The patient tolerated the procedure well. There were no complications. The patient received written and verbal post procedure care education. Post injection medications were not given.                 ASSESSMENT/PLAN:    ICD-10-CM   1. Severe nonproliferative diabetic retinopathy of both eyes with macular edema associated with type 2 diabetes mellitus (HCC)  24/03/2020 Intravitreal Injection, Pharmacologic Agent - OS - Left Eye    Bevacizumab (AVASTIN) SOLN 1.25 mg  2. Retinal edema  H35.81 OCT, Retina - OU - Both Eyes  3. Essential  hypertension  I10   4. Hypertensive retinopathy of both eyes  H35.033 Fluorescein Angiography Optos (Transit OS)  5. Combined forms of age-related cataract of both eyes  H25.813     1,2. Severe Non-proliferative diabetic retinopathy, OU  - A1c 9.9 on 8.31.21 - The incidence, risk factors for progression, natural history and treatment options for diabetic retinopathy were discussed with patient.   - The need for close monitoring of blood glucose, blood pressure, and serum lipids, avoiding cigarette or any type of tobacco, and the need for long term follow up was also discussed with patient. - exam shows central edema OU, scattered IRH and severe exudates OU - FA (09.16.21) shows late leaking MA greatest posterior pole; no NV; +peripheral vascular perfusion defects - OCT shows diabetic macular edema, OU - The natural history, pathology, and characteristics of diabetic macular edema discussed with patient.  A generalized discussion of the major clinical trials concerning treatment of diabetic macular edema (ETDRS, DCT, SCORE, RISE / RIDE, and ongoing DRCR net studies) was completed.  This discussion included mention of the various approaches to treating diabetic macular edema (observation, laser photocoagulation, anti-VEGF injections with lucentis / Avastin / Eylea, steroid injections with Kenalog / Ozurdex, and intraocular surgery with vitrectomy).  The goal hemoglobin A1C of 6-7 was discussed, as  well as importance of smoking cessation and hypertension control.  Need for ongoing treatment and monitoring were specifically discussed with reference to chronic nature of diabetic macular edema. - recommend IVA OU, but OS #1 first today, 09.16.21 - pt wishes to proceed with injections - RBA of procedure discussed, questions answered - Avastin informed consent obtained, signed and scanned, 09.16.21 (OS) - see procedure note - f/u next Tuesday -- DFE/OCT, possible injection OD +/- PRP OS  3,4.  Hypertensive retinopathy OU - discussed importance of tight BP control - monitor  5. Mixed Cataract OU - The symptoms of cataract, surgical options, and treatments and risks were discussed with patient. - discussed diagnosis and progression - visually significant -- will refer for cat eval once DR more stable   Ophthalmic Meds Ordered this visit:  Meds ordered this encounter  Medications  . Bevacizumab (AVASTIN) SOLN 1.25 mg       Return in about 5 days (around 03/24/2020) for f/u NPDR OU, DFE, OCT.  There are no Patient Instructions on file for this visit.   Explained the diagnoses, plan, and follow up with the patient and they expressed understanding.  Patient expressed understanding of the importance of proper follow up care.   This document serves as a record of services personally performed by Gardiner Sleeper, MD, PhD. It was created on their behalf by Leonie Douglas, an ophthalmic technician. The creation of this record is the provider's dictation and/or activities during the visit.    Electronically signed by: Leonie Douglas COA, 03/19/20  8:59 PM   This document serves as a record of services personally performed by Gardiner Sleeper, MD, PhD. It was created on their behalf by San Jetty. Owens Shark, OA an ophthalmic technician. The creation of this record is the provider's dictation and/or activities during the visit.    Electronically signed by: San Jetty. Owens Shark, [place] 09.16.2021 8:59 PM   Gardiner Sleeper, M.D., Ph.D. Diseases & Surgery of the Retina and Vitreous [facility]  I have reviewed the above documentation for accuracy and completeness, and I agree with the above. Gardiner Sleeper, M.D., Ph.D. 03/19/20 8:59 PM    Abbreviations: M myopia (nearsighted); A astigmatism; H hyperopia (farsighted); P presbyopia; Mrx spectacle prescription;  CTL contact lenses; OD right eye; OS left eye; OU both eyes  XT exotropia; ET esotropia; PEK punctate epithelial  keratitis; PEE punctate epithelial erosions; DES dry eye syndrome; MGD meibomian gland dysfunction; ATs artificial tears; PFAT's preservative free artificial tears; [place] nuclear sclerotic cataract; PSC posterior subcapsular cataract; ERM epi-retinal membrane; PVD posterior vitreous detachment; RD retinal detachment; DM diabetes mellitus; DR diabetic retinopathy; NPDR non-proliferative diabetic retinopathy; PDR proliferative diabetic retinopathy; CSME clinically significant macular edema; DME diabetic macular edema; dbh dot blot hemorrhages; CWS cotton wool spot; POAG primary open angle glaucoma; C/D cup-to-disc ratio; HVF humphrey visual field; GVF goldmann visual field; OCT optical coherence tomography; IOP intraocular pressure; BRVO Branch retinal vein occlusion; CRVO central retinal vein occlusion; CRAO central retinal artery occlusion; BRAO branch retinal artery occlusion; RT retinal tear; SB scleral buckle; PPV pars plana vitrectomy; VH Vitreous hemorrhage; PRP panretinal laser photocoagulation; IVK intravitreal kenalog; VMT vitreomacular traction; MH Macular hole;  NVD neovascularization of the disc; NVE neovascularization elsewhere; AREDS age related eye disease study; ARMD age related macular degeneration; POAG primary open angle glaucoma; EBMD epithelial/anterior basement membrane dystrophy; ACIOL anterior chamber intraocular lens; IOL intraocular lens; PCIOL posterior chamber intraocular lens; Phaco/IOL phacoemulsification with intraocular lens placement; [place]  photorefractive keratectomy; LASIK laser assisted in situ keratomileusis; HTN hypertension; DM diabetes mellitus; COPD chronic obstructive pulmonary disease

## 2020-03-19 ENCOUNTER — Ambulatory Visit (INDEPENDENT_AMBULATORY_CARE_PROVIDER_SITE_OTHER): Payer: Medicare Other | Admitting: Ophthalmology

## 2020-03-19 ENCOUNTER — Other Ambulatory Visit: Payer: Self-pay

## 2020-03-19 ENCOUNTER — Encounter (INDEPENDENT_AMBULATORY_CARE_PROVIDER_SITE_OTHER): Payer: Self-pay | Admitting: Ophthalmology

## 2020-03-19 DIAGNOSIS — H35033 Hypertensive retinopathy, bilateral: Secondary | ICD-10-CM | POA: Diagnosis not present

## 2020-03-19 DIAGNOSIS — H3581 Retinal edema: Secondary | ICD-10-CM

## 2020-03-19 DIAGNOSIS — E113413 Type 2 diabetes mellitus with severe nonproliferative diabetic retinopathy with macular edema, bilateral: Secondary | ICD-10-CM | POA: Diagnosis not present

## 2020-03-19 DIAGNOSIS — H25813 Combined forms of age-related cataract, bilateral: Secondary | ICD-10-CM | POA: Diagnosis not present

## 2020-03-19 DIAGNOSIS — I10 Essential (primary) hypertension: Secondary | ICD-10-CM | POA: Diagnosis not present

## 2020-03-19 MED ORDER — BEVACIZUMAB CHEMO INJECTION 1.25MG/0.05ML SYRINGE FOR KALEIDOSCOPE
1.2500 mg | INTRAVITREAL | Status: AC | PRN
  Administered 2020-03-19: 1.25 mg via INTRAVITREAL

## 2020-03-23 NOTE — Progress Notes (Signed)
[facility] Note  03/24/2020     CHIEF COMPLAINT Patient presents for Retina Follow Up   HISTORY OF PRESENT ILLNESS: Calvin [name] is a 59 y.o. male who presents to the clinic today for:   HPI    Retina Follow Up    Patient presents with  Diabetic Retinopathy.  In both eyes.  This started 5 days ago.  I, the attending physician,  performed the HPI with the patient and updated documentation appropriately.          Comments    Patient here for 5 days retina follow up for NPDR OU. Patient states vision OS like a light. No eye pain.       Last edited by [name], MD on 03/24/2020 10:09 AM. (History)    pt states he had no problems after the first Avastin injection, he states his left eye is blurry and looks like he sees a "light"   Referring physician: [name], NP [place],  [place] [id number]  HISTORICAL INFORMATION:   Selected notes from the MEDICAL RECORD NUMBER Referred by Dr. [name] Uncontrolled DM II on insulin A1C: 10.1    CURRENT MEDICATIONS: No current outpatient medications on file. (Ophthalmic Drugs)   No current facility-administered medications for this visit. (Ophthalmic Drugs)   Current Outpatient Medications (Other)  Medication Sig  . albuterol (VENTOLIN HFA) 108 (90 Base) MCG/ACT inhaler Inhale 2 puffs into the lungs every 6 (six) hours as needed for wheezing or shortness of breath.  [name] amitriptyline (ELAVIL) 10 MG tablet Take 2 tablets at bed time  . apixaban (ELIQUIS) 5 MG TABS tablet Take 1 tablet (5 mg total) by mouth 2 (two) times daily.  . carvedilol (COREG) 6.25 MG tablet Take 1 tablet (6.25 mg total) by mouth 2 (two) times daily with a meal.  . cyclobenzaprine (FLEXERIL) 10 MG tablet Take 1 tablet (10 mg total) by mouth 2 (two) times daily as needed for muscle spasms.  . digoxin (LANOXIN) 0.125 MG tablet Take 1 tablet (0.125 mg total) by mouth daily.  [name] donepezil (ARICEPT) 10 MG tablet  Take 1 tablet (10 mg total) by mouth at bedtime.  . Evolocumab (REPATHA SURECLICK) 998 MG/ML SOAJ Inject 1 pen into the skin every 14 (fourteen) days.  [name] ezetimibe (ZETIA) 10 MG tablet Take 1 tablet (10 mg total) by mouth daily.  . insulin degludec (TRESIBA FLEXTOUCH) 200 UNIT/ML FlexTouch Pen Inject 36 Units into the skin daily.  . Insulin Syringe-Needle U-100 (INSULIN SYRINGE .3CC/29GX1/2") 29G X 1/2" 0.3 ML MISC Inject 1 Syringe 3 (three) times daily as directed. Check blood sugar three times daily. Dx: E11.9 (Patient taking differently: Inject 1 Syringe as directed See admin instructions. Check blood sugar three times daily. Dx: E11.9 )  . Lancets (ONETOUCH DELICA PLUS [id number]) MISC USE TWICE DAILY  . lidocaine (LMX) 4 % cream Apply 1 application topically 3 (three) times daily as needed.  [name] lisinopril (ZESTRIL) 10 MG tablet Take 1 tablet (10 mg total) by mouth at bedtime.  . nitroGLYCERIN (NITROSTAT) 0.4 MG SL tablet Place 1 tablet (0.4 mg total) under the tongue every 5 (five) minutes as needed for chest pain.  [name] ULTRA test strip USE TWICE DAILY  . polyethylene glycol (MIRALAX / GLYCOLAX) 17 g packet Take 17 g by mouth 2 (two) times daily.  [name] senna (SENOKOT) 8.6 MG TABS tablet Take 1 tablet (8.6 mg total) by mouth daily as needed for  mild constipation.  [name] spironolactone (ALDACTONE) 25 MG tablet Take 25 mg by mouth daily.    No current facility-administered medications for this visit. (Other)      REVIEW OF SYSTEMS: ROS    Positive for: Endocrine, Cardiovascular, Eyes   Last edited by [name], COA on 03/24/2020  9:24 AM. (History)       ALLERGIES No Known Allergies  PAST MEDICAL HISTORY Past Medical History:  Diagnosis Date  . Adenoidal hypertrophy   . Adenomatous colon polyps 2012  . Arthritis   . Diabetes mellitus   . GERD (gastroesophageal reflux disease)   . Heart attack ([place])    [place] in [place]  [name] Hx of adenomatous colonic polyps 08/18/2017  .  Hyperlipidemia   . Hypertension   . Mild CAD    a. cath in 08/2017 showing mild nonobstructive CAD with scattered 20-30% stenosis.   . Nonischemic cardiomyopathy ([place])    a. EF 20-25% by echo in 09/2017 with cath showing mild CAD. b.  Last echo 12/2017 EF 35-40%, grade 2 DD.  [name] Obesity   . PAF (paroxysmal atrial fibrillation) ([place])   . Sleep apnea    cpap, pt says no longer has   Past Surgical History:  Procedure Laterality Date  . COLONOSCOPY  05/13/11   9 adenomas  . FOREARM SURGERY    . MUSCLE BIOPSY    . RIGHT/LEFT HEART CATH AND CORONARY ANGIOGRAPHY N/A 08/25/2017   Procedure: RIGHT/LEFT HEART CATH AND CORONARY ANGIOGRAPHY;  Surgeon: [name], MD;  Location: [facility];  Service: Cardiovascular;  Laterality: N/A;  . RIGHT/LEFT HEART CATH AND CORONARY ANGIOGRAPHY N/A 10/01/2019   Procedure: RIGHT/LEFT HEART CATH AND CORONARY ANGIOGRAPHY;  Surgeon: [name], MD;  Location: [facility];  Service: Cardiovascular;  Laterality: N/A;    FAMILY HISTORY Family History  Problem Relation Age of Onset  . Heart disease Mother   . Heart attack Mother 62  . Hypertension Mother   . Hyperlipidemia Mother   . Diabetes Mother   . Heart disease Father   . Heart attack Father 67  . Hypertension Father   . Hyperlipidemia Father   . Diabetes Father   . Heart attack Sister 32  . Colon cancer Neg Hx   . Stomach cancer Neg Hx   . Esophageal cancer Neg Hx   . Rectal cancer Neg Hx     SOCIAL HISTORY Social History   Tobacco Use  . Smoking status: Never Smoker  . Smokeless tobacco: Never Used  Vaping Use  . Vaping Use: Never used  Substance Use Topics  . Alcohol use: No  . Drug use: No         OPHTHALMIC EXAM:  Base Eye Exam    Visual Acuity (Snellen - Linear)      Right Left   Dist [place] 20/40 20/70 +1   Dist ph [place] NI NI       Tonometry (Tonopen, 9:21 AM)      Right Left   Pressure 13 17       Pupils      Dark Light Shape React APD    Right 3 2 Round Brisk None   Left 3 2 Round Brisk None       Visual Fields (Counting fingers)      Left Right    Full Full       Extraocular Movement      Right Left    Full,  Ortho Full, Ortho       Neuro/Psych    Oriented x3: Yes   Mood/Affect: Normal       Dilation    Both eyes: 1.0% Mydriacyl, 2.5% Phenylephrine @ 9:21 AM        Slit Lamp and Fundus Exam    Slit Lamp Exam      Right Left   Lids/Lashes Dermatochalasis - upper lid, Meibomian gland dysfunction Dermatochalasis - upper lid, Meibomian gland dysfunction   Conjunctiva/Sclera nasal and temporal pinguecula, Melanosis nasal and temporal pinguecula, Melanosis   Cornea arcus, 2-3+ Punctate epithelial erosions arcus, 3+ Punctate epithelial erosions   Anterior Chamber Deep and quiet Deep and quiet   Iris Round and dilated, No NVI Round and dilated, No NVI   Lens 2-3+ Nuclear sclerosis, 2-3+ Cortical cataract 2-3+ Nuclear sclerosis, 2-3+ Cortical cataract   Vitreous Vitreous syneresis Vitreous syneresis       Fundus Exam      Right Left   Disc Pink and Sharp, no NVD Pink and Sharp, no NVD   C/D Ratio 0.2 0.2   Macula Blunted foveal reflex, mild increase in central edema, scattered exudates/MA/IRH Blunted foveal reflex, massive central edema with severe exudates nasal to fovea -- mild interval improvement, scattered IRH   Vessels Dilated and tortuous, Copper wiring, AV crossing changes, +IRMA Dilated and tortuous, Copper wiring, AV crossing changes   Periphery Attached, scattered IRH/DBH, exudate greatest posteriorly    Attached, scattered IRH/DBH, exudate greatest posteriorly             IMAGING AND PROCEDURES  Imaging and Procedures for 03/24/2020  OCT, Retina - OU - Both Eyes       Right Eye Quality was good. Central Foveal Thickness: 668. Progression has worsened. Findings include abnormal foveal contour, intraretinal fluid, intraretinal hyper-reflective material, subretinal hyper-reflective material,  subretinal fluid, vitreomacular adhesion  (Interval increase in IRF and central edema).   Left Eye Quality was good. Central Foveal Thickness: 641. Progression has improved. Findings include abnormal foveal contour, intraretinal fluid, intraretinal hyper-reflective material, subretinal fluid (Mild interval improvement in IRF/SRF ).   Notes *Images captured and stored on drive  Diagnosis / Impression:  Massive central DME OU (OS > OD) OD: Interval increase in IRF and central edema OS: Mild interval improvement in IRF/SRF   Clinical management:  See below  Abbreviations: NFP - Normal foveal profile. CME - cystoid macular edema. PED - pigment epithelial detachment. IRF - intraretinal fluid. SRF - subretinal fluid. EZ - ellipsoid zone. ERM - epiretinal membrane. ORA - outer retinal atrophy. ORT - outer retinal tubulation. SRHM - subretinal hyper-reflective material. IRHM - intraretinal hyper-reflective material        Intravitreal Injection, Pharmacologic Agent - OD - Right Eye       Time Out 03/24/2020. 10:12 AM. Confirmed correct patient, procedure, site, and patient consented.   Anesthesia Topical anesthesia was used. Anesthetic medications included Lidocaine 2%, Proparacaine 0.5%.   Procedure Preparation included 5% betadine to ocular surface, eyelid speculum. A (32g) needle was used.   Injection:  1.25 mg Bevacizumab (AVASTIN) SOLN   NDC: 44315-400-86, Lot: [id number], Expiration date: 04/30/2020   Route: Intravitreal, Site: Right Eye, Waste: 0.05 mL  Post-op Post injection exam found visual acuity of at least counting fingers. The patient tolerated the procedure well. There were no complications. The patient received written and verbal post procedure care education.                 ASSESSMENT/PLAN:  ICD-10-CM   1. Severe nonproliferative diabetic retinopathy of both eyes with macular edema associated with type 2 diabetes mellitus (HCC)  Z30.8657 Intravitreal  Injection, Pharmacologic Agent - OD - Right Eye    Bevacizumab (AVASTIN) SOLN 1.25 mg  2. Retinal edema  H35.81 OCT, Retina - OU - Both Eyes  3. Essential hypertension  I10   4. Hypertensive retinopathy of both eyes  H35.033   5. Combined forms of age-related cataract of both eyes  H25.813     1,2. Severe Non-proliferative diabetic retinopathy, OU  - A1c 9.9 on 8.31.21  - s/p IVA OS #1 (09.16.21) - exam shows central edema OU, scattered IRH and severe exudates OU - FA (09.16.21) shows late leaking MA greatest posterior pole; no NV; +peripheral vascular perfusion defects - OCT shows diabetic macular edema, OU -- early improvement in DME OS post IVA #1 - recommend IVA OD #1 today, 09.21.21 - pt wishes to proceed with injection  - RBA of procedure discussed, questions answered  - Avastin informed consent obtained, signed and scanned OD, 09.21.21 - see procedure note - Avastin informed consent obtained, signed and scanned, 09.16.21 (OS) - f/u 4 weeks, DFE, OCT, possible injection(s)  3,4. Hypertensive retinopathy OU - discussed importance of tight BP control - monitor  5. Mixed Cataract OU - The symptoms of cataract, surgical options, and treatments and risks were discussed with patient. - discussed diagnosis and progression - visually significant -- will refer for cat eval once DR more stable   Ophthalmic Meds Ordered this visit:  Meds ordered this encounter  Medications  . Bevacizumab (AVASTIN) SOLN 1.25 mg       Return in about 4 weeks (around 04/21/2020) for f/u NPDR OU, DFE, OCT.  There are no Patient Instructions on file for this visit.   Explained the diagnoses, plan, and follow up with the patient and they expressed understanding.  Patient expressed understanding of the importance of proper follow up care.   This document serves as a record of services personally performed by Gardiner Sleeper, MD, PhD. It was created on their behalf by Estill Bakes, COT an  ophthalmic technician. The creation of this record is the provider's dictation and/or activities during the visit.    Electronically signed by: Estill Bakes, COT 9.20.21 @ 11:51 AM   This document serves as a record of services personally performed by Gardiner Sleeper, MD, PhD. It was created on their behalf by San Jetty. Owens Shark, OA an ophthalmic technician. The creation of this record is the provider's dictation and/or activities during the visit.    Electronically signed by: San Jetty. Owens Shark, [place] 09.21.2021 11:51 AM  Gardiner Sleeper, M.D., Ph.D. Diseases & Surgery of the Retina and [place] 03/24/2020   I have reviewed the above documentation for accuracy and completeness, and I agree with the above. Gardiner Sleeper, M.D., Ph.D. 03/24/20 11:51 AM  Abbreviations: M myopia (nearsighted); A astigmatism; H hyperopia (farsighted); P presbyopia; Mrx spectacle prescription;  CTL contact lenses; OD right eye; OS left eye; OU both eyes  XT exotropia; ET esotropia; PEK punctate epithelial keratitis; PEE punctate epithelial erosions; DES dry eye syndrome; MGD meibomian gland dysfunction; ATs artificial tears; PFAT's preservative free artificial tears; [place] nuclear sclerotic cataract; PSC posterior subcapsular cataract; ERM epi-retinal membrane; PVD posterior vitreous detachment; RD retinal detachment; DM diabetes mellitus; DR diabetic retinopathy; NPDR non-proliferative diabetic retinopathy; PDR proliferative diabetic retinopathy; CSME clinically significant macular edema; DME diabetic macular edema; dbh  dot blot hemorrhages; CWS cotton wool spot; POAG primary open angle glaucoma; C/D cup-to-disc ratio; HVF humphrey visual field; GVF goldmann visual field; OCT optical coherence tomography; IOP intraocular pressure; BRVO Branch retinal vein occlusion; CRVO central retinal vein occlusion; CRAO central retinal artery occlusion; BRAO branch retinal artery occlusion; RT retinal tear;  SB scleral buckle; PPV pars plana vitrectomy; VH Vitreous hemorrhage; PRP panretinal laser photocoagulation; IVK intravitreal kenalog; VMT vitreomacular traction; MH Macular hole;  NVD neovascularization of the disc; NVE neovascularization elsewhere; AREDS age related eye disease study; ARMD age related macular degeneration; POAG primary open angle glaucoma; EBMD epithelial/anterior basement membrane dystrophy; ACIOL anterior chamber intraocular lens; IOL intraocular lens; PCIOL posterior chamber intraocular lens; Phaco/IOL phacoemulsification with intraocular lens placement; [place] photorefractive keratectomy; LASIK laser assisted in situ keratomileusis; HTN hypertension; DM diabetes mellitus; COPD chronic obstructive pulmonary disease

## 2020-03-24 ENCOUNTER — Ambulatory Visit (INDEPENDENT_AMBULATORY_CARE_PROVIDER_SITE_OTHER): Payer: Medicare Other | Admitting: Ophthalmology

## 2020-03-24 ENCOUNTER — Other Ambulatory Visit: Payer: Self-pay

## 2020-03-24 ENCOUNTER — Encounter (INDEPENDENT_AMBULATORY_CARE_PROVIDER_SITE_OTHER): Payer: Self-pay | Admitting: Ophthalmology

## 2020-03-24 DIAGNOSIS — H35033 Hypertensive retinopathy, bilateral: Secondary | ICD-10-CM | POA: Diagnosis not present

## 2020-03-24 DIAGNOSIS — I10 Essential (primary) hypertension: Secondary | ICD-10-CM | POA: Diagnosis not present

## 2020-03-24 DIAGNOSIS — E113413 Type 2 diabetes mellitus with severe nonproliferative diabetic retinopathy with macular edema, bilateral: Secondary | ICD-10-CM | POA: Diagnosis not present

## 2020-03-24 DIAGNOSIS — H25813 Combined forms of age-related cataract, bilateral: Secondary | ICD-10-CM

## 2020-03-24 DIAGNOSIS — H3581 Retinal edema: Secondary | ICD-10-CM

## 2020-03-24 MED ORDER — BEVACIZUMAB CHEMO INJECTION 1.25MG/0.05ML SYRINGE FOR KALEIDOSCOPE
1.2500 mg | INTRAVITREAL | Status: AC | PRN
  Administered 2020-03-24: 1.25 mg via INTRAVITREAL

## 2020-03-31 ENCOUNTER — Telehealth: Payer: Self-pay | Admitting: Pharmacist

## 2020-03-31 ENCOUNTER — Other Ambulatory Visit (HOSPITAL_COMMUNITY): Payer: Self-pay

## 2020-03-31 NOTE — Telephone Encounter (Signed)
Received a message from [place], [place], that Mr. Calvin [name]'s coupon for repatha need to be updated. I reviewed his healthwell profile and it appears pt has only filled once. However, his cost is low due to LIS. [employer] grant expired and cannot be renewed due to fund closing. I called pharmacy and confirmed cost of $9.20. I spoke with patient who states he can afford that and will pick up.

## 2020-03-31 NOTE — Progress Notes (Signed)
Paramedicine Encounter    Patient ID: Calvin [name], male    DOB: [birth year], 60 y.o.   MRN: [id number]   [name], NP as PCP - General (Geriatric Medicine) [name], MD as PCP - Cardiology (Cardiology) [name], [name], MD as PCP - Advanced Heart Failure (Cardiology) [name], MD as Consulting Physician (Endocrinology) [name], RN as [place] Management  Patient Active Problem List   Diagnosis Date Noted  . Hypoglycemia due to insulin 10/15/2019  . Chronic anticoagulation 10/15/2019  . COVID-19 virus detected 06/18/2019  . Right sided weakness 06/17/2019  . AKI (acute kidney injury) ([place]) 06/17/2019  . Foot pain, bilateral 02/01/2019  . Abdominal pain 01/23/2019  . Chest pain 01/23/2019  . Right leg pain 12/20/2018  . Overgrown toenails 12/20/2018  . Unsteady gait 12/20/2018  . Encounter for therapeutic drug monitoring 10/22/2018  . Nausea and vomiting 03/10/2018  . AF (paroxysmal atrial fibrillation) ([place]) 03/10/2018  . Non-cardiac chest pain 03/09/2018  . Diabetic neuropathy ([place]) 09/26/2017  . MCI (mild cognitive impairment) 06/23/2017  . OSA on CPAP 05/10/2017  . Gastroesophageal reflux disease 05/10/2017  . Insomnia 05/10/2017  . Onychomycosis of multiple toenails with type 2 diabetes mellitus and peripheral neuropathy ([place]) 05/10/2017  . Chronic systolic heart failure ([place]) 11/22/2015  . Essential hypertension 11/22/2015  . DM (diabetes mellitus) ([place]) 11/20/2015  . HLD (hyperlipidemia) 11/20/2015  . Hx of adenomatous colonic polyps 05/19/2011    Current Outpatient Medications:  .  albuterol (VENTOLIN HFA) 108 (90 Base) MCG/ACT inhaler, Inhale 2 puffs into the lungs every 6 (six) hours as needed for wheezing or shortness of breath., Disp: 6.7 g, Rfl: 0 .  amitriptyline (ELAVIL) 10 MG tablet, Take 2 tablets at bed time, Disp: 180 tablet, Rfl: 3 .  apixaban (ELIQUIS) 5 MG TABS tablet,  Take 1 tablet (5 mg total) by mouth 2 (two) times daily., Disp: 60 tablet, Rfl: 5 .  carvedilol (COREG) 6.25 MG tablet, Take 1 tablet (6.25 mg total) by mouth 2 (two) times daily with a meal., Disp: 180 tablet, Rfl: 3 .  digoxin (LANOXIN) 0.125 MG tablet, Take 1 tablet (0.125 mg total) by mouth daily., Disp: 90 tablet, Rfl: 3 .  donepezil (ARICEPT) 10 MG tablet, Take 1 tablet (10 mg total) by mouth at bedtime., Disp: 90 tablet, Rfl: 3 .  Evolocumab (REPATHA SURECLICK) 093 MG/ML SOAJ, Inject 1 pen into the skin every 14 (fourteen) days., Disp: 6 pen, Rfl: 3 .  ezetimibe (ZETIA) 10 MG tablet, Take 1 tablet (10 mg total) by mouth daily., Disp: 90 tablet, Rfl: 3 .  insulin degludec (TRESIBA FLEXTOUCH) 200 UNIT/ML FlexTouch Pen, Inject 36 Units into the skin daily., Disp: 15 mL, Rfl: 1 .  Insulin Syringe-Needle U-100 (INSULIN SYRINGE .3CC/29GX1/2") 29G X 1/2" 0.3 ML MISC, Inject 1 Syringe 3 (three) times daily as directed. Check blood sugar three times daily. Dx: E11.9 (Patient taking differently: Inject 1 Syringe as directed See admin instructions. Check blood sugar three times daily. Dx: E11.9 ), Disp: 100 each, Rfl: 3 .  Lancets (ONETOUCH DELICA PLUS [id number]) MISC, USE TWICE DAILY, Disp: 200 each, Rfl: 3 .  lidocaine (LMX) 4 % cream, Apply 1 application topically 3 (three) times daily as needed., Disp: 30 g, Rfl: 0 .  lisinopril (ZESTRIL) 10 MG tablet, Take 1 tablet (10 mg total) by mouth at bedtime., Disp: 90 tablet, Rfl: 3 .  ONETOUCH ULTRA test strip, USE TWICE DAILY, Disp: 200 strip,  Rfl: 3 .  polyethylene glycol (MIRALAX / GLYCOLAX) 17 g packet, Take 17 g by mouth 2 (two) times daily., Disp: 14 each, Rfl: 0 .  senna (SENOKOT) 8.6 MG TABS tablet, Take 1 tablet (8.6 mg total) by mouth daily as needed for mild constipation., Disp: 15 tablet, Rfl: 0 .  spironolactone (ALDACTONE) 25 MG tablet, Take 25 mg by mouth daily. , Disp: , Rfl:  .  cyclobenzaprine (FLEXERIL) 10 MG tablet, Take 1 tablet (10 mg  total) by mouth 2 (two) times daily as needed for muscle spasms. (Patient not taking: Reported on 03/31/2020), Disp: 10 tablet, Rfl: 0 .  nitroGLYCERIN (NITROSTAT) 0.4 MG SL tablet, Place 1 tablet (0.4 mg total) under the tongue every 5 (five) minutes as needed for chest pain. (Patient not taking: Reported on 03/31/2020), Disp: 30 tablet, Rfl: 1 No Known Allergies    Social History   Socioeconomic History  . Marital status: Single    Spouse name: Not on file  . Number of children: 0  . Years of education: Not on file  . Highest education level: Not on file  Occupational History  . Occupation: Disability  Tobacco Use  . Smoking status: Never Smoker  . Smokeless tobacco: Never Used  Vaping Use  . Vaping Use: Never used  Substance and Sexual Activity  . Alcohol use: No  . Drug use: No  . Sexual activity: Yes    Birth control/protection: None  Other Topics Concern  . Not on file  Social History Narrative   Social History   Diet?    Do you drink/eat things with caffeine? yes   Marital status?       single      Do you live in a house, apartment, assisted living, condo, trailer, etc.? yes   Is it one or more stories? One story   How many persons live in your home?   Do you have any pets in your home? (please list)    Highest level of education completed? graduate   Do you exercise?            no                          Type & how often?   Advanced Directives   Do you have a living will? no   Do you have a DNR form?                                  If not, do you want to discuss one? no   Do you have signed POA/HPOA for forms? no      Functional Status   Do you have difficulty bathing or dressing yourself? no   Do you have difficulty preparing food or eating? no   Do you have difficulty managing your medications? no   Do you have difficulty managing your finances? no   Do you have difficulty affording your medications? no   Social Determinants of Health   Financial  Resource Strain:   . Difficulty of Paying Living Expenses: Not on file  Food Insecurity: No Food Insecurity  . Worried About Charity fundraiser in the Last Year: Never true  . Ran Out of Food in the Last Year: Never true  Transportation Needs: Unmet Transportation Needs  . Lack of Transportation (Medical): Yes  . Lack of Transportation (Non-Medical): Yes  Physical Activity:   . Days of Exercise per Week: Not on file  . Minutes of Exercise per Session: Not on file  Stress:   . Feeling of Stress : Not on file  Social Connections:   . Frequency of Communication with Friends and Family: Not on file  . Frequency of Social Gatherings with Friends and Family: Not on file  . Attends Religious Services: Not on file  . Active Member of Clubs or Organizations: Not on file  . Attends Archivist Meetings: Not on file  . Marital Status: Not on file  Intimate Partner Violence:   . Fear of Current or Ex-Partner: Not on file  . Emotionally Abused: Not on file  . Physically Abused: Not on file  . Sexually Abused: Not on file    Physical Exam      Future Appointments  Date Time Provider Philip  04/15/2020  2:30 PM [name], MD [facility] None  04/17/2020  9:30 AM [name], RN [facility] None  04/21/2020  9:30 AM [name], MD [facility] None  04/24/2020  3:15 PM Felipa Furnace, DPM [facility]  04/27/2020  9:20 AM [name], [name], MD [facility] None  05/25/2020 11:30 AM [name], NP [facility] None    BP (!) 150/90   Pulse (!) 102   Temp 98.1 F (36.7 C)   Resp 18   Wt 167 lb (75.8 kg)   SpO2 99%   BMI 25.39 kg/m  CBG PTA-340 Weight yesterday-? Last visit weight-167  Pt reports that he is doing ok. He had eye procedure done last week and he needs help filling his pill box this week. However he is driving.  meds verified and 2 pill boxes refilled.  He denies increased sob. No dizziness, no c/p. No edema noted. Lungs clear. HR rapid  but normal rhythm.  He has however been all over the place during our visit, chasing a 59 y/o. Stressed about his niece, several other things going on too.  He said he has not been able to get repatha filled. He said the doctor hasnt sent it to pharmacy--I called pharmacy and a new coupon code is needed. I sent [place] a message asking how we can take care of this. Other than that he has all meds.  His CBG's still high. He states he takes insulin daily and he goes back to see his endo doc in 2 wks. Advised him to just cut back on his juice drinks and watch what he eats.    Marylouise Stacks, [place] [facility] Paramedic  03/31/20

## 2020-04-01 ENCOUNTER — Telehealth: Payer: Self-pay | Admitting: Pharmacist

## 2020-04-01 MED ORDER — ATORVASTATIN CALCIUM 80 MG PO TABS: 80.0000 mg | ORAL_TABLET | Freq: Every day | ORAL | 3 refills | Status: DC

## 2020-04-01 NOTE — Telephone Encounter (Signed)
-----   Message from [place], [facility] sent at 04/01/2020  1:56 PM EDT ----- Regarding: statin gap Hi Calvin [name],  Calvin [name] is failing SUPD measure as not on atorvastatin any longer (d/c'd 2/2 patient preference in med hx notes).  Has been on Repatha for a while.  Saw you had o/v with him last year.  Do you recall or know by chance if atorv d/c'd because of intolerance?    Thanks! [employer]

## 2020-04-01 NOTE — Telephone Encounter (Signed)
Called pt, he does not recall why he stopped taking atorvastatin. Advised him he should resume atorvastatin 1m daily (had been taking this for years without issues). He will pick up refill and resume taking atorvastatin, and is also planning to pick up his Repatha too.

## 2020-04-08 ENCOUNTER — Other Ambulatory Visit: Payer: Self-pay | Admitting: Endocrinology

## 2020-04-15 ENCOUNTER — Ambulatory Visit: Payer: Medicare Other | Admitting: Endocrinology

## 2020-04-17 ENCOUNTER — Other Ambulatory Visit: Payer: Self-pay | Admitting: *Deleted

## 2020-04-17 NOTE — Patient Outreach (Signed)
[facility]) Care Management  04/17/2020  Chrystian Cupples [birth year] [id number]   Call placed to member to follow up on HF and DM management, no answer initially however member immediately called back.  He report managing well but blood sugars remain elevated.  State reading was 320 this morning, usually in the 300 range.  He was supposed to see endocrinologist on 10/13 but rescheduled for 10/20.  He verbalizes importance of keeping this appointment in effort to manage diabetes.  Report K. 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  He admits he is not completely adherent to diabetic/low sodium diet, re-educated on importance.  Denies any change in daily weights.  Does not have any urgent concerns at this time, will follow up within the next month.  Goals Addressed            This Visit's Progress   . [facility] - Monitor and Manage My Blood Sugar       Follow Up Date 11/15   - check blood sugar at prescribed times - check blood sugar before and after exercise - check blood sugar if I feel it is too high or too low - take the blood sugar log to all doctor visits    Why is this important?   Checking your blood sugar at home helps to keep it from getting very high or very low.  Writing the results in a diary or log helps the doctor know how to care for you.  Your blood sugar log should have the time, date and the results.  Also, write down the amount of insulin or other medicine that you take.  Other information, like what you ate, exercise done and how you were feeling, will also be helpful.     Notes:     . [facility] - Set My Target A1C       Follow Up Date 12/15   - set target A1C    Why is this important?   Your target A1C is decided together by you and your doctor.  It is based on several things like your age and other health issues.    Notes:       [name], RN, MSN [place] [phone]

## 2020-04-20 ENCOUNTER — Other Ambulatory Visit: Payer: Self-pay | Admitting: Pharmacist

## 2020-04-20 ENCOUNTER — Other Ambulatory Visit (HOSPITAL_COMMUNITY): Payer: Self-pay

## 2020-04-20 MED ORDER — ATORVASTATIN CALCIUM 80 MG PO TABS: 80.0000 mg | ORAL_TABLET | Freq: Every day | ORAL | 3 refills | Status: DC

## 2020-04-20 NOTE — Progress Notes (Signed)
Paramedicine Encounter    Patient ID: Calvin [name], male    DOB: 11-May-1961, 59 y.o.   MRN: [id number]   [name], NP as PCP - General (Geriatric Medicine) [name], MD as PCP - Cardiology (Cardiology) [name], [name], MD as PCP - Advanced Heart Failure (Cardiology) [name], MD as Consulting Physician (Endocrinology) [name], RN as [place] Management  Patient Active Problem List   Diagnosis Date Noted  . Hypoglycemia due to insulin 10/15/2019  . Chronic anticoagulation 10/15/2019  . COVID-19 virus detected 06/18/2019  . Right sided weakness 06/17/2019  . AKI (acute kidney injury) ([place]) 06/17/2019  . Foot pain, bilateral 02/01/2019  . Abdominal pain 01/23/2019  . Chest pain 01/23/2019  . Right leg pain 12/20/2018  . Overgrown toenails 12/20/2018  . Unsteady gait 12/20/2018  . Encounter for therapeutic drug monitoring 10/22/2018  . Nausea and vomiting 03/10/2018  . AF (paroxysmal atrial fibrillation) ([place]) 03/10/2018  . Non-cardiac chest pain 03/09/2018  . Diabetic neuropathy ([place]) 09/26/2017  . MCI (mild cognitive impairment) 06/23/2017  . OSA on CPAP 05/10/2017  . Gastroesophageal reflux disease 05/10/2017  . Insomnia 05/10/2017  . Onychomycosis of multiple toenails with type 2 diabetes mellitus and peripheral neuropathy ([place]) 05/10/2017  . Chronic systolic heart failure ([place]) 11/22/2015  . Essential hypertension 11/22/2015  . DM (diabetes mellitus) ([place]) 11/20/2015  . HLD (hyperlipidemia) 11/20/2015  . Hx of adenomatous colonic polyps 05/19/2011    Current Outpatient Medications:  .  amitriptyline (ELAVIL) 10 MG tablet, Take 2 tablets at bed time, Disp: 180 tablet, Rfl: 3 .  apixaban (ELIQUIS) 5 MG TABS tablet, Take 1 tablet (5 mg total) by mouth 2 (two) times daily., Disp: 60 tablet, Rfl: 5 .  carvedilol (COREG) 6.25 MG tablet, Take 1 tablet (6.25 mg total) by mouth 2 (two) times  daily with a meal., Disp: 180 tablet, Rfl: 3 .  digoxin (LANOXIN) 0.125 MG tablet, Take 1 tablet (0.125 mg total) by mouth daily., Disp: 90 tablet, Rfl: 3 .  donepezil (ARICEPT) 10 MG tablet, Take 1 tablet (10 mg total) by mouth at bedtime., Disp: 90 tablet, Rfl: 3 .  Evolocumab (REPATHA SURECLICK) 948 MG/ML SOAJ, Inject 1 pen into the skin every 14 (fourteen) days., Disp: 6 pen, Rfl: 3 .  ezetimibe (ZETIA) 10 MG tablet, Take 1 tablet (10 mg total) by mouth daily., Disp: 90 tablet, Rfl: 3 .  Insulin Syringe-Needle U-100 (INSULIN SYRINGE .3CC/29GX1/2") 29G X 1/2" 0.3 ML MISC, Inject 1 Syringe 3 (three) times daily as directed. Check blood sugar three times daily. Dx: E11.9 (Patient taking differently: Inject 1 Syringe as directed See admin instructions. Check blood sugar three times daily. Dx: E11.9 ), Disp: 100 each, Rfl: 3 .  Lancets (ONETOUCH DELICA PLUS NIOEVO35K) MISC, USE TWICE DAILY, Disp: 200 each, Rfl: 3 .  lidocaine (LMX) 4 % cream, Apply 1 application topically 3 (three) times daily as needed., Disp: 30 g, Rfl: 0 .  lisinopril (ZESTRIL) 10 MG tablet, Take 1 tablet (10 mg total) by mouth at bedtime., Disp: 90 tablet, Rfl: 3 .  ONETOUCH ULTRA test strip, USE TWICE DAILY, Disp: 200 strip, Rfl: 3 .  polyethylene glycol (MIRALAX / GLYCOLAX) 17 g packet, Take 17 g by mouth 2 (two) times daily., Disp: 14 each, Rfl: 0 .  senna (SENOKOT) 8.6 MG TABS tablet, Take 1 tablet (8.6 mg total) by mouth daily as needed for mild constipation., Disp: 15 tablet, Rfl: 0 .  spironolactone (ALDACTONE) 25 MG tablet, Take 25 mg by mouth daily. , Disp: , Rfl:  .  TRESIBA FLEXTOUCH 200 UNIT/ML FlexTouch Pen, INJECT SUBCUTANEOUSLY 30  UNITS DAILY (Patient taking differently: 36 Units. ), Disp: 18 mL, Rfl: 3 .  albuterol (VENTOLIN HFA) 108 (90 Base) MCG/ACT inhaler, Inhale 2 puffs into the lungs every 6 (six) hours as needed for wheezing or shortness of breath. (Patient not taking: Reported on 04/20/2020), Disp: 6.7 g,  Rfl: 0 .  atorvastatin (LIPITOR) 80 MG tablet, Take 1 tablet (80 mg total) by mouth daily., Disp: 90 tablet, Rfl: 3 .  cyclobenzaprine (FLEXERIL) 10 MG tablet, Take 1 tablet (10 mg total) by mouth 2 (two) times daily as needed for muscle spasms. (Patient not taking: Reported on 03/31/2020), Disp: 10 tablet, Rfl: 0 .  nitroGLYCERIN (NITROSTAT) 0.4 MG SL tablet, Place 1 tablet (0.4 mg total) under the tongue every 5 (five) minutes as needed for chest pain. (Patient not taking: Reported on 03/31/2020), Disp: 30 tablet, Rfl: 1 No Known Allergies    Social History   Socioeconomic History  . Marital status: Single    Spouse name: Not on file  . Number of children: 0  . Years of education: Not on file  . Highest education level: Not on file  Occupational History  . Occupation: Disability  Tobacco Use  . Smoking status: Never Smoker  . Smokeless tobacco: Never Used  Vaping Use  . Vaping Use: Never used  Substance and Sexual Activity  . Alcohol use: No  . Drug use: No  . Sexual activity: Yes    Birth control/protection: None  Other Topics Concern  . Not on file  Social History Narrative   Social History   Diet?    Do you drink/eat things with caffeine? yes   Marital status?       single      Do you live in a house, apartment, assisted living, condo, trailer, etc.? yes   Is it one or more stories? One story   How many persons live in your home?   Do you have any pets in your home? (please list)    Highest level of education completed? graduate   Do you exercise?            no                          Type & how often?   Advanced Directives   Do you have a living will? no   Do you have a DNR form?                                  If not, do you want to discuss one? no   Do you have signed POA/HPOA for forms? no      Functional Status   Do you have difficulty bathing or dressing yourself? no   Do you have difficulty preparing food or eating? no   Do you have difficulty managing  your medications? no   Do you have difficulty managing your finances? no   Do you have difficulty affording your medications? no   Social Determinants of Health   Financial Resource Strain:   . Difficulty of Paying Living Expenses: Not on file  Food Insecurity: No Food Insecurity  . Worried About Charity fundraiser in the Last Year: Never true  .  Ran Out of Food in the Last Year: Never true  Transportation Needs: Unmet Transportation Needs  . Lack of Transportation (Medical): Yes  . Lack of Transportation (Non-Medical): Yes  Physical Activity:   . Days of Exercise per Week: Not on file  . Minutes of Exercise per Session: Not on file  Stress:   . Feeling of Stress : Not on file  Social Connections:   . Frequency of Communication with Friends and Family: Not on file  . Frequency of Social Gatherings with Friends and Family: Not on file  . Attends Religious Services: Not on file  . Active Member of Clubs or Organizations: Not on file  . Attends Archivist Meetings: Not on file  . Marital Status: Not on file  Intimate Partner Violence:   . Fear of Current or Ex-Partner: Not on file  . Emotionally Abused: Not on file  . Physically Abused: Not on file  . Sexually Abused: Not on file    Physical Exam      Future Appointments  Date Time Provider [place]  04/21/2020  9:30 AM [name], MD [facility] None  04/22/2020  1:00 PM [name], MD [facility] None  04/24/2020  3:15 PM Felipa Furnace, DPM [facility]  04/27/2020  9:20 AM [name], [name], MD [facility] None  05/11/2020 10:30 AM [name], RN [facility] None  05/25/2020 11:30 AM [name], NP [facility] None    BP 138/76   Pulse 86   Temp 97.6 F (36.4 C)   Resp 18   Wt 167 lb (75.8 kg)   SpO2 94%   BMI 25.39 kg/m   Weight yesterday-167 Last visit weight-167  Pt reports he has been doing better. He denies increased sob. No dizziness, he has been getting sharp chest  pains in upper portion of chest. He said mostly it comes when he is laying down, no sob with it.  Pt had gone out of town last week so unable to see him when I came back to work-he said he filled his pill box on his own and felt comfortable. He did fill them today and I watched over him as he did it.  His atorvastatin got added back onto his regimen.  Not sure why they even got removed-it may have been when he was in hosp last year for COVID. He has another bottle of the eliquis at his other house.  He will fill up the other pill box when he gets back home.  pts insulin has been increased to 36U-CBG's still running the 300s.  No other issues or concerns at this time.  His atorvastatin will be sent to [facility] for future refills.  Will see him at clinic next week.    Marylouise Stacks, [facility] Paramedic  04/20/20

## 2020-04-20 NOTE — Progress Notes (Signed)
[facility] Note  04/21/2020     CHIEF COMPLAINT Patient presents for Retina Follow Up   HISTORY OF PRESENT ILLNESS: Calvin [name] is a 59 y.o. male who presents to the clinic today for:   HPI    Retina Follow Up    Patient presents with  Diabetic Retinopathy.  In both eyes.  Duration of 4 weeks.  Since onset it is rapidly worsening.  I, the attending physician,  performed the HPI with the patient and updated documentation appropriately.          Comments    4 week follow up NPDR OU- Vision is very cloudy OU.  When the sunlight hits his eyes he can't see anything.  BS: 342 this morning   A1C: 9.9 02/2020 Normal range 300-400       Last edited by [name], MD on 04/21/2020 12:22 PM. (History)    Pt feels he is doing alright; seeing occasional floaters OU.  BP doing alright; BS still running high.  Pt is seeing his endocrinologist tomorrow.  Referring physician: [name], NP [place],  [place] [address]  HISTORICAL INFORMATION:   Selected notes from the MEDICAL RECORD NUMBER Referred by Dr. [name] Uncontrolled DM II on insulin A1C: 10.1   CURRENT MEDICATIONS: No current outpatient medications on file. (Ophthalmic Drugs)   No current facility-administered medications for this visit. (Ophthalmic Drugs)   Current Outpatient Medications (Other)  Medication Sig  . albuterol (VENTOLIN HFA) 108 (90 Base) MCG/ACT inhaler Inhale 2 puffs into the lungs every 6 (six) hours as needed for wheezing or shortness of breath.  [name] amitriptyline (ELAVIL) 10 MG tablet Take 2 tablets at bed time  . apixaban (ELIQUIS) 5 MG TABS tablet Take 1 tablet (5 mg total) by mouth 2 (two) times daily.  [name] atorvastatin (LIPITOR) 80 MG tablet Take 1 tablet (80 mg total) by mouth daily.  . carvedilol (COREG) 6.25 MG tablet Take 1 tablet (6.25 mg total) by mouth 2 (two) times daily with a meal.  . cyclobenzaprine (FLEXERIL) 10 MG tablet Take 1  tablet (10 mg total) by mouth 2 (two) times daily as needed for muscle spasms.  . digoxin (LANOXIN) 0.125 MG tablet Take 1 tablet (0.125 mg total) by mouth daily.  [name] donepezil (ARICEPT) 10 MG tablet Take 1 tablet (10 mg total) by mouth at bedtime.  . Evolocumab (REPATHA SURECLICK) 427 MG/ML SOAJ Inject 1 pen into the skin every 14 (fourteen) days.  [name] ezetimibe (ZETIA) 10 MG tablet Take 1 tablet (10 mg total) by mouth daily.  [name] lidocaine (LMX) 4 % cream Apply 1 application topically 3 (three) times daily as needed.  [name] lisinopril (ZESTRIL) 10 MG tablet Take 1 tablet (10 mg total) by mouth at bedtime.  . nitroGLYCERIN (NITROSTAT) 0.4 MG SL tablet Place 1 tablet (0.4 mg total) under the tongue every 5 (five) minutes as needed for chest pain.  . polyethylene glycol (MIRALAX / GLYCOLAX) 17 g packet Take 17 g by mouth 2 (two) times daily.  [name] senna (SENOKOT) 8.6 MG TABS tablet Take 1 tablet (8.6 mg total) by mouth daily as needed for mild constipation.  [name] spironolactone (ALDACTONE) 25 MG tablet Take 25 mg by mouth daily.   . TRESIBA FLEXTOUCH 200 UNIT/ML FlexTouch Pen INJECT SUBCUTANEOUSLY 30  UNITS DAILY (Patient taking differently: 36 Units. )  . Insulin Syringe-Needle U-100 (INSULIN SYRINGE .3CC/29GX1/2") 29G X 1/2" 0.3 ML MISC Inject 1 Syringe 3 (  three) times daily as directed. Check blood sugar three times daily. Dx: E11.9 (Patient taking differently: Inject 1 Syringe as directed See admin instructions. Check blood sugar three times daily. Dx: E11.9 )  . Lancets (ONETOUCH DELICA PLUS [id number]) MISC USE TWICE DAILY  . ONETOUCH ULTRA test strip USE TWICE DAILY   No current facility-administered medications for this visit. (Other)      REVIEW OF SYSTEMS: ROS    Positive for: Gastrointestinal, Genitourinary, Musculoskeletal, Endocrine, Cardiovascular, Eyes, Respiratory   Negative for: Constitutional, Neurological, Skin, HENT, Psychiatric, Allergic/Imm, Heme/Lymph   Last edited by Leonie Douglas,  COA on 04/21/2020  9:37 AM. (History)       ALLERGIES No Known Allergies  PAST MEDICAL HISTORY Past Medical History:  Diagnosis Date  . Adenoidal hypertrophy   . Adenomatous colon polyps 2012  . Arthritis   . Diabetes mellitus   . GERD (gastroesophageal reflux disease)   . Heart attack ([place])    While living in [place]  [name] Hx of adenomatous colonic polyps 08/18/2017  . Hyperlipidemia   . Hypertension   . Mild CAD    a. cath in 08/2017 showing mild nonobstructive CAD with scattered 20-30% stenosis.   . Nonischemic cardiomyopathy ([place])    a. EF 20-25% by echo in 09/2017 with cath showing mild CAD. b.  Last echo 12/2017 EF 35-40%, grade 2 DD.  [name] Obesity   . PAF (paroxysmal atrial fibrillation) ([place])   . Sleep apnea    cpap, pt says no longer has   Past Surgical History:  Procedure Laterality Date  . COLONOSCOPY  05/13/11   9 adenomas  . FOREARM SURGERY    . MUSCLE BIOPSY    . RIGHT/LEFT HEART CATH AND CORONARY ANGIOGRAPHY N/A 08/25/2017   Procedure: RIGHT/LEFT HEART CATH AND CORONARY ANGIOGRAPHY;  Surgeon: [name], MD;  Location: [facility];  Service: Cardiovascular;  Laterality: N/A;  . RIGHT/LEFT HEART CATH AND CORONARY ANGIOGRAPHY N/A 10/01/2019   Procedure: RIGHT/LEFT HEART CATH AND CORONARY ANGIOGRAPHY;  Surgeon: [name], MD;  Location: [facility];  Service: Cardiovascular;  Laterality: N/A;    FAMILY HISTORY Family History  Problem Relation Age of Onset  . Heart disease Mother   . Heart attack Mother 7  . Hypertension Mother   . Hyperlipidemia Mother   . Diabetes Mother   . Heart disease Father   . Heart attack Father 35  . Hypertension Father   . Hyperlipidemia Father   . Diabetes Father   . Heart attack Sister 14  . Colon cancer Neg Hx   . Stomach cancer Neg Hx   . Esophageal cancer Neg Hx   . Rectal cancer Neg Hx     SOCIAL HISTORY Social History   Tobacco Use  . Smoking status: Never Smoker  . Smokeless  tobacco: Never Used  Vaping Use  . Vaping Use: Never used  Substance Use Topics  . Alcohol use: No  . Drug use: No         OPHTHALMIC EXAM:  Base Eye Exam    Visual Acuity (Snellen - Linear)      Right Left   Dist [place] 20/40 -1 20/60   Dist ph [place] NI NI       Tonometry (Tonopen, 9:42 AM)      Right Left   Pressure 17 16       Pupils      Dark Light Shape React APD   Right 3 2 Round  Brisk None   Left 3 2 Round Brisk None       Visual Fields (Counting fingers)      Left Right    Full Full       Extraocular Movement      Right Left    Full Full       Neuro/Psych    Oriented x3: Yes   Mood/Affect: Normal       Dilation    Both eyes: 1.0% Mydriacyl, 2.5% Phenylephrine @ 9:42 AM        Slit Lamp and Fundus Exam    Slit Lamp Exam      Right Left   Lids/Lashes Dermatochalasis - upper lid, Meibomian gland dysfunction Dermatochalasis - upper lid, Meibomian gland dysfunction   Conjunctiva/Sclera nasal and temporal pinguecula, Melanosis nasal and temporal pinguecula, Melanosis   Cornea arcus, 2-3+ Punctate epithelial erosions arcus, 3+ Punctate epithelial erosions   Anterior Chamber Deep and quiet Deep and quiet   Iris Round and dilated, No NVI Round and dilated, No NVI   Lens 2-3+ Nuclear sclerosis, 2-3+ Cortical cataract 2-3+ Nuclear sclerosis, 2-3+ Cortical cataract   Vitreous Vitreous syneresis Vitreous syneresis       Fundus Exam      Right Left   Disc Pink and Sharp, no NVD Pink and Sharp, no NVD   C/D Ratio 0.2 0.2   Macula Blunted foveal reflex, mild improvement in central edema, scattered exudates/MA/IRH Blunted foveal reflex, massive central edema with severe exudates nasal to fovea -- mild interval improvement, scattered IRH   Vessels Dilated and tortuous, Copper wiring, AV crossing changes, +IRMA Dilated and tortuous, Copper wiring, AV crossing changes   Periphery Attached, scattered IRH/DBH, exudate greatest posteriorly    Attached, scattered  IRH/DBH, exudate greatest posteriorly             IMAGING AND PROCEDURES  Imaging and Procedures for 04/21/2020  OCT, Retina - OU - Both Eyes       Right Eye Quality was good. Central Foveal Thickness: 474. Progression has improved. Findings include abnormal foveal contour, intraretinal fluid, intraretinal hyper-reflective material, subretinal hyper-reflective material, subretinal fluid, vitreomacular adhesion  (Interval improvement in IRF/IRHM/foveal profile).   Left Eye Quality was good. Central Foveal Thickness: 522. Progression has improved. Findings include abnormal foveal contour, intraretinal fluid, intraretinal hyper-reflective material, subretinal fluid (Interval improvement in IRF/SRF/IRHM).   Notes *Images captured and stored on drive  Diagnosis / Impression:  Massive central DME OU (OS > OD) OD: Interval improvement in IRF/IRHM/foveal profile OS: Interval improvement in IRF/SRF/IRHM  Clinical management:  See below  Abbreviations: NFP - Normal foveal profile. CME - cystoid macular edema. PED - pigment epithelial detachment. IRF - intraretinal fluid. SRF - subretinal fluid. EZ - ellipsoid zone. ERM - epiretinal membrane. ORA - outer retinal atrophy. ORT - outer retinal tubulation. SRHM - subretinal hyper-reflective material. IRHM - intraretinal hyper-reflective material        Intravitreal Injection, Pharmacologic Agent - OD - Right Eye       Time Out 04/21/2020. 10:57 AM. Confirmed correct patient, procedure, site, and patient consented.   Anesthesia Topical anesthesia was used. Anesthetic medications included Lidocaine 2%, Proparacaine 0.5%.   Procedure Preparation included 5% betadine to ocular surface, eyelid speculum. A (32g) needle was used.   Injection:  1.25 mg Bevacizumab (AVASTIN) SOLN   NDC: 97026-378-58, Lot: [id number], Expiration date: 06/19/2020   Route: Intravitreal, Site: Right Eye, Waste: 0.05 mL  Post-op Post injection exam found visual  acuity of  at least counting fingers. The patient tolerated the procedure well. There were no complications. The patient received written and verbal post procedure care education. Post injection medications were not given.        Intravitreal Injection, Pharmacologic Agent - OS - Left Eye       Time Out 04/21/2020. 10:58 AM. Confirmed correct patient, procedure, site, and patient consented.   Anesthesia Topical anesthesia was used. Anesthetic medications included Lidocaine 2%, Proparacaine 0.5%.   Procedure Preparation included 5% betadine to ocular surface, eyelid speculum. A (32g) needle was used.   Injection:  1.25 mg Bevacizumab (AVASTIN) SOLN   NDC: 72536-644-03, Lot: [id number], Expiration date: 05/10/2020   Route: Intravitreal, Site: Left Eye, Waste: 0.05 mL  Post-op Post injection exam found visual acuity of at least counting fingers. The patient tolerated the procedure well. There were no complications. The patient received written and verbal post procedure care education.                 ASSESSMENT/PLAN:    ICD-10-CM   1. Severe nonproliferative diabetic retinopathy of both eyes with macular edema associated with type 2 diabetes mellitus (HCC)  G38.7564 Intravitreal Injection, Pharmacologic Agent - OD - Right Eye    Intravitreal Injection, Pharmacologic Agent - OS - Left Eye    Bevacizumab (AVASTIN) SOLN 1.25 mg    Bevacizumab (AVASTIN) SOLN 1.25 mg  2. Retinal edema  H35.81 OCT, Retina - OU - Both Eyes  3. Essential hypertension  I10   4. Hypertensive retinopathy of both eyes  H35.033   5. Combined forms of age-related cataract of both eyes  H25.813     1,2. Severe Non-proliferative diabetic retinopathy w/ DME, OU  - A1c 9.9 on 8.31.21  - s/p IVA OS #1 (09.16.21), IVA OD #1 (09.21.21) - exam shows central edema OU, scattered IRH and severe exudates OU - FA (09.16.21) shows late leaking MA greatest posterior pole; no NV; +peripheral vascular perfusion defects --  may benefit from peripheral PRP OU - OCT shows interval improvement in DME OU -- improved IRF/IRHM and foveal contour OU - IVA OU #2 today, 10.19.21  - Avastin informed consent obtained, signed and scanned OD, 09.21.21 - Avastin informed consent obtained, signed and scanned, 09.16.21 (OS) - f/u 4 weeks, DFE, OCT, possible injection(s)  3,4. Hypertensive retinopathy OU - discussed importance of tight BP control - monitor  5. Mixed Cataract OU - The symptoms of cataract, surgical options, and treatments and risks were discussed with patient. - discussed diagnosis and progression - visually significant -- will refer for cat eval once DR more stable   Ophthalmic Meds Ordered this visit:  Meds ordered this encounter  Medications  . Bevacizumab (AVASTIN) SOLN 1.25 mg  . Bevacizumab (AVASTIN) SOLN 1.25 mg       Return in about 4 weeks (around 05/19/2020) for 4 wk f/u for severe NPDR OU w/DFE/OCT/poss. inj. OU.  There are no Patient Instructions on file for this visit.   Explained the diagnoses, plan, and follow up with the patient and they expressed understanding.  Patient expressed understanding of the importance of proper follow up care.   This document serves as a record of services personally performed by Gardiner Sleeper, MD, PhD. It was created on their behalf by Estill Bakes, COT an ophthalmic technician. The creation of this record is the provider's dictation and/or activities during the visit.    Electronically signed by: Estill Bakes, COT 10.18.21 @ 12:26 PM   This document serves  as a record of services personally performed by Gardiner Sleeper, MD, PhD. It was created on their behalf by San Jetty. Owens Shark, OA an ophthalmic technician. The creation of this record is the provider's dictation and/or activities during the visit.    Electronically signed by: San Jetty. Owens Shark, [place] 10.19.2021 12:26 PM  This document serves as a record of services personally performed by Gardiner Sleeper, MD, PhD. It was created on their behalf by Estill Bakes, COT an ophthalmic technician. The creation of this record is the provider's dictation and/or activities during the visit.    Electronically signed by: Estill Bakes, [place] 10.19.21 @ 12:26 PM  Gardiner Sleeper, M.D., Ph.D. Diseases & Surgery of the Retina and [place] 04/21/2020   I have reviewed the above documentation for accuracy and completeness, and I agree with the above. Gardiner Sleeper, M.D., Ph.D. 04/21/20 12:26 PM  Abbreviations: M myopia (nearsighted); A astigmatism; H hyperopia (farsighted); P presbyopia; Mrx spectacle prescription;  CTL contact lenses; OD right eye; OS left eye; OU both eyes  XT exotropia; ET esotropia; PEK punctate epithelial keratitis; PEE punctate epithelial erosions; DES dry eye syndrome; MGD meibomian gland dysfunction; ATs artificial tears; PFAT's preservative free artificial tears; [place] nuclear sclerotic cataract; PSC posterior subcapsular cataract; ERM epi-retinal membrane; PVD posterior vitreous detachment; RD retinal detachment; DM diabetes mellitus; DR diabetic retinopathy; NPDR non-proliferative diabetic retinopathy; PDR proliferative diabetic retinopathy; CSME clinically significant macular edema; DME diabetic macular edema; dbh dot blot hemorrhages; CWS cotton wool spot; POAG primary open angle glaucoma; C/D cup-to-disc ratio; HVF humphrey visual field; GVF goldmann visual field; OCT optical coherence tomography; IOP intraocular pressure; BRVO Branch retinal vein occlusion; CRVO central retinal vein occlusion; CRAO central retinal artery occlusion; BRAO branch retinal artery occlusion; RT retinal tear; SB scleral buckle; PPV pars plana vitrectomy; VH Vitreous hemorrhage; PRP panretinal laser photocoagulation; IVK intravitreal kenalog; VMT vitreomacular traction; MH Macular hole;  NVD neovascularization of the disc; NVE neovascularization elsewhere; AREDS age related  eye disease study; ARMD age related macular degeneration; POAG primary open angle glaucoma; EBMD epithelial/anterior basement membrane dystrophy; ACIOL anterior chamber intraocular lens; IOL intraocular lens; PCIOL posterior chamber intraocular lens; Phaco/IOL phacoemulsification with intraocular lens placement; [place] photorefractive keratectomy; LASIK laser assisted in situ keratomileusis; HTN hypertension; DM diabetes mellitus; COPD chronic obstructive pulmonary disease

## 2020-04-21 ENCOUNTER — Ambulatory Visit (INDEPENDENT_AMBULATORY_CARE_PROVIDER_SITE_OTHER): Payer: Medicare Other | Admitting: Ophthalmology

## 2020-04-21 ENCOUNTER — Other Ambulatory Visit: Payer: Self-pay

## 2020-04-21 ENCOUNTER — Encounter (INDEPENDENT_AMBULATORY_CARE_PROVIDER_SITE_OTHER): Payer: Self-pay | Admitting: Ophthalmology

## 2020-04-21 ENCOUNTER — Other Ambulatory Visit (HOSPITAL_COMMUNITY): Payer: Self-pay | Admitting: Cardiology

## 2020-04-21 DIAGNOSIS — E113413 Type 2 diabetes mellitus with severe nonproliferative diabetic retinopathy with macular edema, bilateral: Secondary | ICD-10-CM | POA: Diagnosis not present

## 2020-04-21 DIAGNOSIS — H25813 Combined forms of age-related cataract, bilateral: Secondary | ICD-10-CM | POA: Diagnosis not present

## 2020-04-21 DIAGNOSIS — H3581 Retinal edema: Secondary | ICD-10-CM | POA: Diagnosis not present

## 2020-04-21 DIAGNOSIS — I10 Essential (primary) hypertension: Secondary | ICD-10-CM | POA: Diagnosis not present

## 2020-04-21 DIAGNOSIS — H35033 Hypertensive retinopathy, bilateral: Secondary | ICD-10-CM

## 2020-04-21 MED ORDER — BEVACIZUMAB CHEMO INJECTION 1.25MG/0.05ML SYRINGE FOR KALEIDOSCOPE
1.2500 mg | INTRAVITREAL | Status: AC | PRN
  Administered 2020-04-21: 1.25 mg via INTRAVITREAL

## 2020-04-22 ENCOUNTER — Ambulatory Visit (INDEPENDENT_AMBULATORY_CARE_PROVIDER_SITE_OTHER): Payer: Medicare Other | Admitting: Endocrinology

## 2020-04-22 VITALS — BP 144/72 | HR 89 | Ht 68.0 in | Wt 173.0 lb

## 2020-04-22 DIAGNOSIS — Z794 Long term (current) use of insulin: Secondary | ICD-10-CM | POA: Diagnosis not present

## 2020-04-22 DIAGNOSIS — E1140 Type 2 diabetes mellitus with diabetic neuropathy, unspecified: Secondary | ICD-10-CM

## 2020-04-22 LAB — POCT GLYCOSYLATED HEMOGLOBIN (HGB A1C): Hemoglobin A1C: 9.3 % — AB (ref 4.0–5.6)

## 2020-04-22 MED ORDER — TRESIBA FLEXTOUCH 200 UNIT/ML ~~LOC~~ SOPN
40.0000 [IU] | PEN_INJECTOR | Freq: Every day | SUBCUTANEOUS | 3 refills | Status: DC
Start: 2020-04-22 — End: 2020-06-09

## 2020-04-22 NOTE — Patient Instructions (Addendum)
Your blood pressure is high today.  Please see your primary care provider soon, to have it rechecked.   Please increase the Tresiba to 40 units daily.  On this type of insulin, you should eat meals on a regular schedule.  If a meal is missed or significantly delayed, your blood sugar could go low.   check your blood sugar twice a day.  vary the time of day when you check, between before the 3 meals, and at bedtime.  also check if you have symptoms of your blood sugar being too high or too low.  please keep a record of the readings and bring it to your next appointment here (or you can bring the meter itself).  You can write it on any piece of paper.  please call us sooner if your blood sugar goes below 70, or if you have a lot of readings over 200.   Please come back for a follow-up appointment in 6 weeks.

## 2020-04-22 NOTE — Progress Notes (Signed)
Subjective:    Patient ID: Calvin [name], male    DOB: 1961-04-11, 59 y.o.   MRN: [id number]  HPI Pt returns for f/u of diabetes mellitus:  DM type: Insulin-requiring type 2. Dx'ed: 2505 Complications: PN, CAD, CRI, and DR.   Therapy: insulin since soon after dx.  DKA: never Severe hypoglycemia: once, in 2021.   Pancreatitis: never Pancreatic imaging: normal on 2003 CT. SDOH: due to noncompliance, he is not a candidate for multiple daily injections; he is retired; he gets insulin from pt assistance; changes between brands must be minimized, due to LHL.  Other: up to early 2021, he was taking more than 200 units QD; pt requests that insulin be titrated up very slowly, due to h/o severe hypoglycemia.    Interval history: no cbg record, but states cbg's are still in the 200's-300's.  He says he takes insulin as rx'ed.   Past Medical History:  Diagnosis Date  . Adenoidal hypertrophy   . Adenomatous colon polyps 2012  . Arthritis   . Diabetes mellitus   . GERD (gastroesophageal reflux disease)   . Heart attack [place])    [place]  [name] Hx of adenomatous colonic polyps 08/18/2017  . Hyperlipidemia   . Hypertension   . Mild CAD    a. cath in 08/2017 showing mild nonobstructive CAD with scattered 20-30% stenosis.   . Nonischemic cardiomyopathy ([place])    a. EF 20-25% by echo in 09/2017 with cath showing mild CAD. b.  Last echo 12/2017 EF 35-40%, grade 2 DD.  [name] Obesity   . PAF (paroxysmal atrial fibrillation) ([place])   . Sleep apnea    cpap, pt says no longer has    Past Surgical History:  Procedure Laterality Date  . COLONOSCOPY  05/13/11   9 adenomas  . FOREARM SURGERY    . MUSCLE BIOPSY    . RIGHT/LEFT HEART CATH AND CORONARY ANGIOGRAPHY N/A 08/25/2017   Procedure: RIGHT/LEFT HEART CATH AND CORONARY ANGIOGRAPHY;  Surgeon: [name], MD;  Location: [facility];  Service: Cardiovascular;  Laterality: N/A;  . RIGHT/LEFT HEART CATH AND CORONARY  ANGIOGRAPHY N/A 10/01/2019   Procedure: RIGHT/LEFT HEART CATH AND CORONARY ANGIOGRAPHY;  Surgeon: [name], MD;  Location: [facility];  Service: Cardiovascular;  Laterality: N/A;    Social History   Socioeconomic History  . Marital status: Single    Spouse name: Not on file  . Number of children: 0  . Years of education: Not on file  . Highest education level: Not on file  Occupational History  . Occupation: Disability  Tobacco Use  . Smoking status: Never Smoker  . Smokeless tobacco: Never Used  Vaping Use  . Vaping Use: Never used  Substance and Sexual Activity  . Alcohol use: No  . Drug use: No  . Sexual activity: Yes    Birth control/protection: None  Other Topics Concern  . Not on file  Social History Narrative   Social History   Diet?    Do you drink/eat things with caffeine? yes   Marital status?       single      Do you live in a house, apartment, assisted living, condo, trailer, etc.? yes   Is it one or more stories? One story   How many persons live in your home?   Do you have any pets in your home? (please list)    Highest level of education completed? graduate   Do  you exercise?            no                          Type & how often?   Advanced Directives   Do you have a living will? no   Do you have a DNR form?                                  If not, do you want to discuss one? no   Do you have signed POA/HPOA for forms? no      Functional Status   Do you have difficulty bathing or dressing yourself? no   Do you have difficulty preparing food or eating? no   Do you have difficulty managing your medications? no   Do you have difficulty managing your finances? no   Do you have difficulty affording your medications? no   Social Determinants of Health   Financial Resource Strain:   . Difficulty of Paying Living Expenses: Not on file  Food Insecurity: No Food Insecurity  . Worried About Charity fundraiser in the Last Year: Never true    . Ran Out of Food in the Last Year: Never true  Transportation Needs: Unmet Transportation Needs  . Lack of Transportation (Medical): Yes  . Lack of Transportation (Non-Medical): Yes  Physical Activity:   . Days of Exercise per Week: Not on file  . Minutes of Exercise per Session: Not on file  Stress:   . Feeling of Stress : Not on file  Social Connections:   . Frequency of Communication with Friends and Family: Not on file  . Frequency of Social Gatherings with Friends and Family: Not on file  . Attends Religious Services: Not on file  . Active Member of Clubs or Organizations: Not on file  . Attends Archivist Meetings: Not on file  . Marital Status: Not on file  Intimate Partner Violence:   . Fear of Current or Ex-Partner: Not on file  . Emotionally Abused: Not on file  . Physically Abused: Not on file  . Sexually Abused: Not on file    Current Outpatient Medications on File Prior to Visit  Medication Sig Dispense Refill  . albuterol (VENTOLIN HFA) 108 (90 Base) MCG/ACT inhaler Inhale 2 puffs into the lungs every 6 (six) hours as needed for wheezing or shortness of breath. 6.7 g 0  . amitriptyline (ELAVIL) 10 MG tablet Take 2 tablets at bed time 180 tablet 3  . apixaban (ELIQUIS) 5 MG TABS tablet Take 1 tablet (5 mg total) by mouth 2 (two) times daily. 60 tablet 5  . atorvastatin (LIPITOR) 80 MG tablet Take 1 tablet (80 mg total) by mouth daily. 90 tablet 3  . carvedilol (COREG) 6.25 MG tablet Take 1 tablet (6.25 mg total) by mouth 2 (two) times daily with a meal. 180 tablet 3  . cyclobenzaprine (FLEXERIL) 10 MG tablet Take 1 tablet (10 mg total) by mouth 2 (two) times daily as needed for muscle spasms. 10 tablet 0  . digoxin (LANOXIN) 0.125 MG tablet Take 1 tablet (0.125 mg total) by mouth daily. 90 tablet 3  . donepezil (ARICEPT) 10 MG tablet Take 1 tablet (10 mg total) by mouth at bedtime. 90 tablet 3  . Evolocumab (REPATHA SURECLICK) 196 MG/ML SOAJ Inject 1 pen  into the skin every  14 (fourteen) days. 6 pen 3  . ezetimibe (ZETIA) 10 MG tablet Take 1 tablet (10 mg total) by mouth daily. 90 tablet 3  . Insulin Syringe-Needle U-100 (INSULIN SYRINGE .3CC/29GX1/2") 29G X 1/2" 0.3 ML MISC Inject 1 Syringe 3 (three) times daily as directed. Check blood sugar three times daily. Dx: E11.9 (Patient taking differently: Inject 1 Syringe as directed See admin instructions. Check blood sugar three times daily. Dx: E11.9 ) 100 each 3  . Lancets (ONETOUCH DELICA PLUS [id number]) MISC USE TWICE DAILY 200 each 3  . lidocaine (LMX) 4 % cream Apply 1 application topically 3 (three) times daily as needed. 30 g 0  . lisinopril (ZESTRIL) 10 MG tablet Take 1 tablet (10 mg total) by mouth at bedtime. 90 tablet 3  . nitroGLYCERIN (NITROSTAT) 0.4 MG SL tablet Place 1 tablet (0.4 mg total) under the tongue every 5 (five) minutes as needed for chest pain. 30 tablet 1  . ONETOUCH ULTRA test strip USE TWICE DAILY 200 strip 3  . polyethylene glycol (MIRALAX / GLYCOLAX) 17 g packet Take 17 g by mouth 2 (two) times daily. 14 each 0  . senna (SENOKOT) 8.6 MG TABS tablet Take 1 tablet (8.6 mg total) by mouth daily as needed for mild constipation. 15 tablet 0  . spironolactone (ALDACTONE) 25 MG tablet TAKE 1 TABLET BY MOUTH  DAILY 90 tablet 3   No current facility-administered medications on file prior to visit.    No Known Allergies  Family History  Problem Relation Age of Onset  . Heart disease Mother   . Heart attack Mother 51  . Hypertension Mother   . Hyperlipidemia Mother   . Diabetes Mother   . Heart disease Father   . Heart attack Father 25  . Hypertension Father   . Hyperlipidemia Father   . Diabetes Father   . Heart attack Sister 93  . Colon cancer Neg Hx   . Stomach cancer Neg Hx   . Esophageal cancer Neg Hx   . Rectal cancer Neg Hx     BP (!) 144/72   Pulse 89   Ht 5' 8"  (1.727 m)   Wt 173 lb (78.5 kg)   SpO2 99%   BMI 26.30 kg/m    Review of Systems He  denies hypoglycemia.      Objective:   Physical Exam VITAL SIGNS:  See vs page GENERAL: no distress Pulses: dorsalis pedis intact bilat.   MSK: no deformity of the feet CV: no leg edema Skin:  no ulcer on the feet.  normal color and temp on the feet. Neuro: sensation is intact to touch on the feet  A1c=9.3%     Assessment & Plan:  HTN: is noted today Insulin-requiring type 2 DM, with CRI: uncontrolled.   Patient Instructions  Your blood pressure is high today.  Please see your primary care provider soon, to have it rechecked.   Please increase the Tresiba to 40 units daily.  On this type of insulin, you should eat meals on a regular schedule.  If a meal is missed or significantly delayed, your blood sugar could go low.   check your blood sugar twice a day.  vary the time of day when you check, between before the 3 meals, and at bedtime.  also check if you have symptoms of your blood sugar being too high or too low.  please keep a record of the readings and bring it to your next appointment here (or you can  bring the meter itself).  You can write it on any piece of paper.  please call us sooner if your blood sugar goes below 70, or if you have a lot of readings over 200.   Please come back for a follow-up appointment in 6 weeks.

## 2020-04-24 ENCOUNTER — Other Ambulatory Visit: Payer: Self-pay

## 2020-04-24 ENCOUNTER — Ambulatory Visit (INDEPENDENT_AMBULATORY_CARE_PROVIDER_SITE_OTHER): Payer: Medicare Other | Admitting: Podiatry

## 2020-04-24 DIAGNOSIS — B351 Tinea unguium: Secondary | ICD-10-CM | POA: Diagnosis not present

## 2020-04-24 DIAGNOSIS — E1142 Type 2 diabetes mellitus with diabetic polyneuropathy: Secondary | ICD-10-CM

## 2020-04-24 DIAGNOSIS — M79676 Pain in unspecified toe(s): Secondary | ICD-10-CM | POA: Diagnosis not present

## 2020-04-24 DIAGNOSIS — R601 Generalized edema: Secondary | ICD-10-CM | POA: Diagnosis not present

## 2020-04-26 NOTE — Progress Notes (Signed)
ADVANCED HF CLINIC NOTE  Referring Physician: Primary Care: Dr [name]  Primary Cardiologist: Dr Angelena Form  Neurology: Dr Delice Lesch  [facility]: Dr. Haroldine Laws   HPI  Calvin [name] is a 59 yo male with history of chronic systolic heart failure due to NICM with EF 35-40%, uncontrolled DMII, GERD, HTN, MI 2015, memory issues, and hyperlipidemia. Of note he was homeless for 6 years but was able to get housing in February of 2020.   Admitted 08/2017 with CP. Underwent LHC/RHC with mild nonobstructive CAD & EF 35-40%.  Readmitted in 10/2017 with chest pain. CTA was negative for PE. He was also admitted in 03/2018 with chest pain. He had a brief episode of A fib and was started on eliquis and carvedilol was increased to 6.25 mg twice a day.  In the past he was on Entresto but this was later stopped due to dizziness. Placed back on lisinopril.   Had sleep study 07/2018 that was normal.   Repeat 2D Echo 01/2019 that showed EF 35-40%, mild LVH, normal RV size and function. No significant valvular dysfunction.   06/2019 Admitted for Covid 19 infection. Was placed on BiPAP but did not require intubation.  Since Covid, he has continued w/ persistent fatigue and persistent dyspnea. Followed by Paramedicine once a week.   Seen by Dr. Angelena Form 08/05/19. Had EKG that showed NSR. No afib. RRR on exam today. He remains on Eliquis for PAF.  I saw him in 3/21 for first time and was very depressed/stressed out and was having a lot of fatigue and SOB. R/L cath in 3/21 with non-obstructive CAD (LAD 67m 60d, LCX 40% diffuse, RCA 30p, 5110m RHC well compensated PCWP 8 CI 4.3  Here for routine f/u with Paramedicine (KJoellen [place] Says he feels ok. Still with pains in right arm. Says he loves to walk - "it eases my mind". Walks about 2 hours a day. Mild SOB.Takes breaks as needed. No CP. No edema, orthopnea or PND. Taking all meds. No bleeding with Eliquis. According to Paramedicine SBP ~130.    Echo 09/27/19 EF 40-45% RV ok.    Studies  ECHO 12/2017  EF 35-40% Grade II DD. Moderate global reduction in LV systolic function; moderate   diastolic dysfunction. ECHO 09/2017  EF 20-25% Grade IDD  LHC/RHC 08/2017  RA 5  PA 22/11 (15)  PCWP 9  CO 6 CI 3/14   Prox RCA to Mid RCA lesion is 30% stenosed.  Mid RCA to Dist RCA lesion is 30% stenosed.  Prox Cx to Dist Cx lesion is 20% stenosed.  Mid LAD lesion is 30% stenosed.  There is mild to moderate left ventricular systolic dysfunction.  LV end diastolic pressure is mildly elevated.  The left ventricular ejection fraction is 35-45% by visual estimate.  There is no mitral valve regurgitation.    SH: Disabled for 6 years due to diabetes. He does not smoke. Denies cocaine abuse.  Past Medical History:  Diagnosis Date  . Adenoidal hypertrophy   . Adenomatous colon polyps 2012  . Arthritis   . Diabetes mellitus   . GERD (gastroesophageal reflux disease)   . [place]   [place]  . Marland Kitchenx of adenomatous colonic polyps 08/18/2017  . Hyperlipidemia   . Hypertension   . Mild CAD    a. cath in 08/2017 showing mild nonobstructive CAD with scattered 20-30% stenosis.   . [place]ischemic cardiomyopathy ([place]   a. EF 20-25% by echo in 09/2017 with cath showing  mild CAD. b.  Last echo 12/2017 EF 35-40%, grade 2 DD.  [name] Obesity   . PAF (paroxysmal atrial fibrillation) ([place])   . Sleep apnea    cpap, pt says no longer has    Current Outpatient Medications  Medication Sig Dispense Refill  . albuterol (VENTOLIN HFA) 108 (90 Base) MCG/ACT inhaler Inhale 2 puffs into the lungs every 6 (six) hours as needed for wheezing or shortness of breath. 6.7 g 0  . amitriptyline (ELAVIL) 10 MG tablet Take 2 tablets at bed time 180 tablet 3  . apixaban (ELIQUIS) 5 MG TABS tablet Take 1 tablet (5 mg total) by mouth 2 (two) times daily. 60 tablet 5  . atorvastatin (LIPITOR) 80 MG tablet Take 1 tablet (80 mg total) by mouth daily. 90 tablet 3  . carvedilol (COREG)  6.25 MG tablet Take 1 tablet (6.25 mg total) by mouth 2 (two) times daily with a meal. 180 tablet 3  . cyclobenzaprine (FLEXERIL) 10 MG tablet Take 1 tablet (10 mg total) by mouth 2 (two) times daily as needed for muscle spasms. 10 tablet 0  . digoxin (LANOXIN) 0.125 MG tablet Take 1 tablet (0.125 mg total) by mouth daily. 90 tablet 3  . donepezil (ARICEPT) 10 MG tablet Take 1 tablet (10 mg total) by mouth at bedtime. 90 tablet 3  . Evolocumab (REPATHA SURECLICK) 101 MG/ML SOAJ Inject 1 pen into the skin every 14 (fourteen) days. 6 pen 3  . ezetimibe (ZETIA) 10 MG tablet Take 1 tablet (10 mg total) by mouth daily. 90 tablet 3  . insulin degludec (TRESIBA FLEXTOUCH) 200 UNIT/ML FlexTouch Pen Inject 40 Units into the skin daily. 18 mL 3  . Insulin Syringe-Needle U-100 (INSULIN SYRINGE .3CC/29GX1/2") 29G X 1/2" 0.3 ML MISC Inject 1 Syringe 3 (three) times daily as directed. Check blood sugar three times daily. Dx: E11.9 (Patient taking differently: Inject 1 Syringe as directed See admin instructions. Check blood sugar three times daily. Dx: E11.9 ) 100 each 3  . Lancets (ONETOUCH DELICA PLUS [id number]) MISC USE TWICE DAILY 200 each 3  . lidocaine (LMX) 4 % cream Apply 1 application topically 3 (three) times daily as needed. 30 g 0  . lisinopril (ZESTRIL) 10 MG tablet Take 1 tablet (10 mg total) by mouth at bedtime. 90 tablet 3  . nitroGLYCERIN (NITROSTAT) 0.4 MG SL tablet Place 1 tablet (0.4 mg total) under the tongue every 5 (five) minutes as needed for chest pain. 30 tablet 1  . ONETOUCH ULTRA test strip USE TWICE DAILY 200 strip 3  . polyethylene glycol (MIRALAX / GLYCOLAX) 17 g packet Take 17 g by mouth 2 (two) times daily. 14 each 0  . senna (SENOKOT) 8.6 MG TABS tablet Take 1 tablet (8.6 mg total) by mouth daily as needed for mild constipation. 15 tablet 0  . spironolactone (ALDACTONE) 25 MG tablet TAKE 1 TABLET BY MOUTH  DAILY 90 tablet 3   No current facility-administered medications for this  encounter.    No Known Allergies    Social History   Socioeconomic History  . Marital status: Single    Spouse name: Not on file  . Number of children: 0  . Years of education: Not on file  . Highest education level: Not on file  Occupational History  . Occupation: Disability  Tobacco Use  . Smoking status: Never Smoker  . Smokeless tobacco: Never Used  Vaping Use  . Vaping Use: Never used  Substance and Sexual Activity  .  Alcohol use: No  . Drug use: No  . Sexual activity: Yes    Birth control/protection: None  Other Topics Concern  . Not on file  Social History Narrative   Social History   Diet?    Do you drink/eat things with caffeine? yes   Marital status?       single      Do you live in a house, apartment, assisted living, condo, trailer, etc.? yes   Is it one or more stories? One story   How many persons live in your home?   Do you have any pets in your home? (please list)    Highest level of education completed? graduate   Do you exercise?            no                          Type & how often?   Advanced Directives   Do you have a living will? no   Do you have a DNR form?                                  If not, do you want to discuss one? no   Do you have signed POA/HPOA for forms? no      Functional Status   Do you have difficulty bathing or dressing yourself? no   Do you have difficulty preparing food or eating? no   Do you have difficulty managing your medications? no   Do you have difficulty managing your finances? no   Do you have difficulty affording your medications? no   Social Determinants of Health   Financial Resource Strain:   . Difficulty of Paying Living Expenses: Not on file  Food Insecurity: No Food Insecurity  . Worried About Charity fundraiser in the Last Year: Never true  . Ran Out of Food in the Last Year: Never true  Transportation Needs: Unmet Transportation Needs  . Lack of Transportation (Medical): Yes  . Lack of  Transportation (Non-Medical): Yes  Physical Activity:   . Days of Exercise per Week: Not on file  . Minutes of Exercise per Session: Not on file  Stress:   . Feeling of Stress : Not on file  Social Connections:   . Frequency of Communication with Friends and Family: Not on file  . Frequency of Social Gatherings with Friends and Family: Not on file  . Attends Religious Services: Not on file  . Active Member of Clubs or Organizations: Not on file  . Attends Archivist Meetings: Not on file  . Marital Status: Not on file  Intimate Partner Violence:   . Fear of Current or Ex-Partner: Not on file  . Emotionally Abused: Not on file  . Physically Abused: Not on file  . Sexually Abused: Not on file      Family History  Problem Relation Age of Onset  . Heart disease Mother   . Heart attack Mother 40  . Hypertension Mother   . Hyperlipidemia Mother   . Diabetes Mother   . Heart disease Father   . Heart attack Father 3  . Hypertension Father   . Hyperlipidemia Father   . Diabetes Father   . Heart attack Sister 37  . Colon cancer Neg Hx   . Stomach cancer Neg Hx   . Esophageal cancer Neg  Hx   . Rectal cancer Neg Hx     Vitals:   04/27/20 0927  BP: 118/62  Pulse: 85  SpO2: 99%  Weight: 77.8 kg (171 lb 8 oz)   Wt Readings from Last 3 Encounters:  04/27/20 77.8 kg (171 lb 8 oz)  04/22/20 78.5 kg (173 lb)  04/20/20 75.8 kg (167 lb)     PHYSICAL EXAM: General:  Well appearing. No resp difficulty HEENT: normal Neck: supple. no JVD. Carotids 2+ bilat; no bruits. No lymphadenopathy or thryomegaly appreciated. Cor: PMI nondisplaced. Regular rate & rhythm. No rubs, gallops or murmurs. Lungs: clear Abdomen: soft, nontender, nondistended. No hepatosplenomegaly. No bruits or masses. Good bowel sounds. Extremities: no cyanosis, clubbing, rash, edema Neuro: alert & orientedx3, cranial nerves grossly intact. moves all 4 extremities w/o difficulty. Affect  pleasant    ASSESSMENT & PLAN: 1. Chronic Systolic /Diastolic Heart Failure, NICM - ECHO 12/2017 EF 35-40%. LHC 2/219 with nonobstructive CAD.  - ECHO 01/2019, EF 35-40% - Echo 09/27/19 EF 40-45% RV ok  - Continue to complain of severe fatigue and dyspnea after COVID 19 infection 06/2019 - R/L cath in 3/21 with non-obstructive CAD (LAD 47m 60d, LCX 40% diffuse, RCA 30p, 550m RHC well compensated PCWP 8 CI 4.3 - Volume status ok. NYHA I-II - Increase lisinopril to 20 mg qhs.  He was intolerant entresto due to dizziness.  - Continue Coreg 6.25 mg bid - Continue spironolactone 12.5 mg daily - stop digoxin  - check labs  2. HTN - BP ok. Increasing lisinopril as above  3. Memory Loss - On Aricept per Neurology  - Paramedicine to continue to help with medications.   4. Uncontrolled DM - Most recent hgb A1C 11.9 -> 9.3 - Management per Dr. ElLoanne DrillingInsulin just increased last week - Consider addition of SGLT2i   5. CAD:  - mild nonobstructive CAD by cath 08/2017 & 3/21 - no ASA w/ Eliquis  - continue  blocker - on statin, repatha and zetia -> lipids managed by PCP  6. PAF - Eliquis for a/c. Denies abnormal bleeding.     DaGlori BickersMD  9:36 AM

## 2020-04-27 ENCOUNTER — Other Ambulatory Visit: Payer: Self-pay

## 2020-04-27 ENCOUNTER — Ambulatory Visit (HOSPITAL_COMMUNITY)
Admission: RE | Admit: 2020-04-27 | Discharge: 2020-04-27 | Disposition: A | Discharge status: Home / Self Care | Payer: Medicare Other | Source: Ambulatory Visit | Attending: Internal Medicine | Admitting: Internal Medicine

## 2020-04-27 ENCOUNTER — Other Ambulatory Visit (HOSPITAL_COMMUNITY): Payer: Self-pay

## 2020-04-27 VITALS — BP 118/62 | HR 85 | Wt 171.5 lb

## 2020-04-27 DIAGNOSIS — Z7901 Long term (current) use of anticoagulants: Secondary | ICD-10-CM | POA: Diagnosis not present

## 2020-04-27 DIAGNOSIS — I5022 Chronic systolic (congestive) heart failure: Secondary | ICD-10-CM | POA: Diagnosis not present

## 2020-04-27 DIAGNOSIS — Z8349 Family history of other endocrine, nutritional and metabolic diseases: Secondary | ICD-10-CM | POA: Insufficient documentation

## 2020-04-27 DIAGNOSIS — Z79899 Other long term (current) drug therapy: Secondary | ICD-10-CM | POA: Insufficient documentation

## 2020-04-27 DIAGNOSIS — I480 Paroxysmal atrial fibrillation: Secondary | ICD-10-CM

## 2020-04-27 DIAGNOSIS — I5042 Chronic combined systolic (congestive) and diastolic (congestive) heart failure: Secondary | ICD-10-CM | POA: Insufficient documentation

## 2020-04-27 DIAGNOSIS — I110 Hypertensive heart disease with heart failure: Secondary | ICD-10-CM | POA: Insufficient documentation

## 2020-04-27 DIAGNOSIS — Z833 Family history of diabetes mellitus: Secondary | ICD-10-CM | POA: Insufficient documentation

## 2020-04-27 DIAGNOSIS — Z8249 Family history of ischemic heart disease and other diseases of the circulatory system: Secondary | ICD-10-CM | POA: Diagnosis not present

## 2020-04-27 DIAGNOSIS — Z794 Long term (current) use of insulin: Secondary | ICD-10-CM | POA: Insufficient documentation

## 2020-04-27 DIAGNOSIS — M79601 Pain in right arm: Secondary | ICD-10-CM | POA: Insufficient documentation

## 2020-04-27 DIAGNOSIS — E785 Hyperlipidemia, unspecified: Secondary | ICD-10-CM | POA: Insufficient documentation

## 2020-04-27 DIAGNOSIS — Z8616 Personal history of COVID-19: Secondary | ICD-10-CM | POA: Insufficient documentation

## 2020-04-27 DIAGNOSIS — R413 Other amnesia: Secondary | ICD-10-CM | POA: Diagnosis not present

## 2020-04-27 DIAGNOSIS — I252 Old myocardial infarction: Secondary | ICD-10-CM | POA: Insufficient documentation

## 2020-04-27 DIAGNOSIS — I2510 Atherosclerotic heart disease of native coronary artery without angina pectoris: Secondary | ICD-10-CM | POA: Diagnosis not present

## 2020-04-27 DIAGNOSIS — E1165 Type 2 diabetes mellitus with hyperglycemia: Secondary | ICD-10-CM | POA: Diagnosis not present

## 2020-04-27 DIAGNOSIS — F32A Depression, unspecified: Secondary | ICD-10-CM | POA: Insufficient documentation

## 2020-04-27 DIAGNOSIS — I428 Other cardiomyopathies: Secondary | ICD-10-CM | POA: Insufficient documentation

## 2020-04-27 LAB — BASIC METABOLIC PANEL
Anion gap: 8 (ref 5–15)
BUN: 12 mg/dL (ref 6–20)
CO2: 27 mmol/L (ref 22–32)
Calcium: 8.9 mg/dL (ref 8.9–10.3)
Chloride: 103 mmol/L (ref 98–111)
Creatinine, Ser: 1.29 mg/dL — ABNORMAL HIGH (ref 0.61–1.24)
GFR, Estimated: >60 mL/min (ref >60)
Glucose, Bld: 180 mg/dL — ABNORMAL HIGH (ref 70–99)
Potassium: 4.1 mmol/L (ref 3.5–5.1)
Sodium: 138 mmol/L (ref 135–145)

## 2020-04-27 LAB — CBC
HCT: 43.8 % (ref 39.0–52.0)
Hemoglobin: 14.3 g/dL (ref 13.0–17.0)
MCH: 29.9 pg (ref 26.0–34.0)
MCHC: 32.6 g/dL (ref 30.0–36.0)
MCV: 91.6 fL (ref 80.0–100.0)
Platelets: 224 10*3/uL (ref 150–400)
RBC: 4.78 MIL/uL (ref 4.22–5.81)
RDW: 11.9 % (ref 11.5–15.5)
WBC: 4.7 10*3/uL (ref 4.0–10.5)
nRBC: 0 % (ref 0.0–0.2)

## 2020-04-27 LAB — BRAIN NATRIURETIC PEPTIDE: B Natriuretic Peptide: 28.6 pg/mL (ref 0.0–100.0)

## 2020-04-27 MED ORDER — LISINOPRIL 20 MG PO TABS
20.0000 mg | ORAL_TABLET | Freq: Every day | ORAL | 6 refills | Status: DC
Start: 2020-04-27 — End: 2020-08-05

## 2020-04-27 NOTE — Patient Instructions (Signed)
Increase Lisinopril to 20 mg Daily at bedtime  STOP Digoxin  Labs done today, we will call you for abnormal results  Please call our office in March 2022 to schedule your follow up appointment  If you have any questions or concerns before your next appointment please send [place] a message through [place] or call our office at [phone].    TO LEAVE A MESSAGE FOR THE NURSE SELECT OPTION 2, PLEASE LEAVE A MESSAGE INCLUDING: . YOUR NAME . DATE OF BIRTH . CALL BACK NUMBER . REASON FOR CALL**this is important as we prioritize the call backs  [place] AS LONG AS YOU CALL BEFORE 4:00 PM  At the [facility], you and your health needs are our priority. As part of our continuing mission to provide you with exceptional heart care, we have created designated Provider Care Teams. These Care Teams include your primary Cardiologist (physician) and Advanced Practice Providers (APPs- Physician Assistants and Nurse Practitioners) who all work together to provide you with the care you need, when you need it.   You may see any of the following providers on your designated Care Team at your next follow up: [name] Dr Glori Bickers . Dr Loralie Champagne . Darrick Grinder, NP . Lyda Jester, PA . Audry Riles, PharmD   Please be sure to bring in all your medications bottles to every appointment.

## 2020-04-27 NOTE — Progress Notes (Signed)
Paramedicine Encounter   Patient ID: Calvin [name] , male,   DOB: 1960/07/14,59 y.o.,  MRN: [id number]   Met patient in clinic today with provider.  Weight @ clinic-171 HR-85 [id number] B/p-118/62  Pt did take his meds this morning. Pt reports feeling ok this morning. HF is on point right now. Still working with him to fill his own pill box without issues.  He is moving and will call me next week to let me know where he will be at. He is looking for home to himself but with housing right now is very difficult and not much available due to COVID restricting evictions and the wait list is a few years long.  Increase lisinopril to 92m.  Stop digoxin. --will have to call pharmacy to stop the digoxin.   --ask dr eLoanne Drillingabout taking jardiance --he will add an additional lisinopril to his regimen when he gets home and I will see him next week.  Labs done today.   He will be back in 6 months at clinic.   KMarylouise Stacks EHeeney10/25/2021

## 2020-04-27 NOTE — Addendum Note (Signed)
Encounter addended by: Scarlette Calico, RN on: 04/27/2020 9:51 AM  Actions taken: Diagnosis association updated, Medication long-term status modified, Pharmacy for encounter modified, Order list changed, Clinical Note Signed, Charge Capture section accepted

## 2020-04-28 ENCOUNTER — Encounter: Payer: Self-pay | Admitting: Podiatry

## 2020-04-28 NOTE — Progress Notes (Signed)
  Subjective:  Patient ID: Calvin [name], male    DOB: 14-Aug-1960,  MRN: [id number]  Chief Complaint  Patient presents with  . routine foot care    nail trim    59 y.o. male returns for the above complaint.  Patient presents with thickened elongated dystrophic toenails x10.  No pain on palpation.  He states it hurts when ambulating.  He is diabetic with last A1c of 9.3.  He is also on Eliquis.  He would like to have them debrided down.  He has secondary complaint of bilateral lower extremity edema.  He would like to know if there is any conservative treatment options to help decrease the fluid.  He denies any other acute complaints.  Objective:   There were no vitals filed for this visit. Podiatric Exam: Vascular: dorsalis pedis and posterior tibial pulses are palpable bilateral. Capillary return is immediate. Temperature gradient is WNL. Skin turgor WNL  Sensorium: Normal Semmes Weinstein monofilament test. Normal tactile sensation bilaterally. Nail Exam: Pt has thick disfigured discolored nails with subungual debris noted bilateral entire nail hallux through fifth toenails Ulcer Exam: There is no evidence of ulcer or pre-ulcerative changes or infection. Orthopedic Exam: Muscle tone and strength are WNL. No limitations in general ROM. No crepitus or effusions noted. HAV  B/L.  Hammer toes 2-5  B/L. Skin: No Porokeratosis. No infection or ulcers.  Bilateral lower extremity edema 1+ pitting bilaterally generalized.  Assessment & Plan:  Patient was evaluated and treated and all questions answered.  Onychomycosis with pain  -Nails palliatively debrided as below. -Educated on self-care  Bilateral bunion/hammertoe deformities -I explained to the patient the etiology of bunions and hammertoe to deformities and various treatment options were extensively discussed.  Given the patient is a diabetic with A1c of 9.1 with excessive pressure points to both of his feet I believe he will benefit  from diabetic shoes.  He will be scheduled to see rec for diabetic shoes.  Bilateral lower extremity xerosis -I explained to the patient the etiology of xerosis and various treatment options were extensively discussed.  I explained to the patient the importance of maintaining moisturization of the skin with application of over-the-counter lotion such as Eucerin or Luciderm.  I have asked the patient to apply this twice a day.  If unable to resolve patient will benefit from prescription lotion.  Edema generalized bilateral lower extremity -I explained to patient the etiology of edema and various treatment options were extensively discussed.  I believe patient will benefit from compression socks as well as aggressive elevation.  I discussed with the patient importance of doing so therefore reducing the fluid in the lower extremity.  Patient states understanding and will obtain compression socks.   Procedure: Nail Debridement Rationale: pain  Type of Debridement: manual, sharp debridement. Instrumentation: Nail nipper, rotary burr. Number of Nails: 10  Procedures and Treatment: Consent by patient was obtained for treatment procedures. The patient understood the discussion of treatment and procedures well. All questions were answered thoroughly reviewed. Debridement of mycotic and hypertrophic toenails, 1 through 5 bilateral and clearing of subungual debris. No ulceration, no infection noted.  Return Visit-Office Procedure: Patient instructed to return to the office for a follow up visit 3 months for continued evaluation and treatment.  Boneta Lucks, DPM    No follow-ups on file.

## 2020-05-05 ENCOUNTER — Other Ambulatory Visit (HOSPITAL_COMMUNITY): Payer: Self-pay

## 2020-05-05 NOTE — Progress Notes (Signed)
Paramedicine Encounter    Patient ID: Calvin [name], male    DOB: 1960-11-21, 59 y.o.   MRN: [id number]   [name], NP as PCP - General (Geriatric Medicine) [name], MD as PCP - Cardiology (Cardiology) [name], [name], MD as PCP - Advanced Heart Failure (Cardiology) [name], MD as Consulting Physician (Endocrinology) [name], RN as [place] Management  Patient Active Problem List   Diagnosis Date Noted  . Hypoglycemia due to insulin 10/15/2019  . Chronic anticoagulation 10/15/2019  . COVID-19 virus detected 06/18/2019  . Right sided weakness 06/17/2019  . AKI (acute kidney injury) ([place]) 06/17/2019  . Foot pain, bilateral 02/01/2019  . Abdominal pain 01/23/2019  . Chest pain 01/23/2019  . Right leg pain 12/20/2018  . Overgrown toenails 12/20/2018  . Unsteady gait 12/20/2018  . Encounter for therapeutic drug monitoring 10/22/2018  . Nausea and vomiting 03/10/2018  . AF (paroxysmal atrial fibrillation) ([place]) 03/10/2018  . Non-cardiac chest pain 03/09/2018  . Diabetic neuropathy ([place]) 09/26/2017  . MCI (mild cognitive impairment) 06/23/2017  . OSA on CPAP 05/10/2017  . Gastroesophageal reflux disease 05/10/2017  . Insomnia 05/10/2017  . Onychomycosis of multiple toenails with type 2 diabetes mellitus and peripheral neuropathy ([place]) 05/10/2017  . Chronic systolic heart failure ([place]) 11/22/2015  . Essential hypertension 11/22/2015  . DM (diabetes mellitus) ([place]) 11/20/2015  . HLD (hyperlipidemia) 11/20/2015  . Hx of adenomatous colonic polyps 05/19/2011    Current Outpatient Medications:  .  albuterol (VENTOLIN HFA) 108 (90 Base) MCG/ACT inhaler, Inhale 2 puffs into the lungs every 6 (six) hours as needed for wheezing or shortness of breath., Disp: 6.7 g, Rfl: 0 .  amitriptyline (ELAVIL) 10 MG tablet, Take 2 tablets at bed time, Disp: 180 tablet, Rfl: 3 .  apixaban (ELIQUIS) 5 MG TABS tablet,  Take 1 tablet (5 mg total) by mouth 2 (two) times daily., Disp: 60 tablet, Rfl: 5 .  atorvastatin (LIPITOR) 80 MG tablet, Take 1 tablet (80 mg total) by mouth daily., Disp: 90 tablet, Rfl: 3 .  carvedilol (COREG) 6.25 MG tablet, Take 1 tablet (6.25 mg total) by mouth 2 (two) times daily with a meal., Disp: 180 tablet, Rfl: 3 .  donepezil (ARICEPT) 10 MG tablet, Take 1 tablet (10 mg total) by mouth at bedtime., Disp: 90 tablet, Rfl: 3 .  Evolocumab (REPATHA SURECLICK) 258 MG/ML SOAJ, Inject 1 pen into the skin every 14 (fourteen) days., Disp: 6 pen, Rfl: 3 .  ezetimibe (ZETIA) 10 MG tablet, Take 1 tablet (10 mg total) by mouth daily., Disp: 90 tablet, Rfl: 3 .  insulin degludec (TRESIBA FLEXTOUCH) 200 UNIT/ML FlexTouch Pen, Inject 40 Units into the skin daily., Disp: 18 mL, Rfl: 3 .  Insulin Syringe-Needle U-100 (INSULIN SYRINGE .3CC/29GX1/2") 29G X 1/2" 0.3 ML MISC, Inject 1 Syringe 3 (three) times daily as directed. Check blood sugar three times daily. Dx: E11.9 (Patient taking differently: Inject 1 Syringe as directed See admin instructions. Check blood sugar three times daily. Dx: E11.9 ), Disp: 100 each, Rfl: 3 .  Lancets (ONETOUCH DELICA PLUS NIDPOE42P) MISC, USE TWICE DAILY, Disp: 200 each, Rfl: 3 .  lidocaine (LMX) 4 % cream, Apply 1 application topically 3 (three) times daily as needed., Disp: 30 g, Rfl: 0 .  lisinopril (ZESTRIL) 20 MG tablet, Take 1 tablet (20 mg total) by mouth at bedtime., Disp: 30 tablet, Rfl: 6 .  ONETOUCH ULTRA test strip, USE TWICE DAILY, Disp: 200 strip,  Rfl: 3 .  polyethylene glycol (MIRALAX / GLYCOLAX) 17 g packet, Take 17 g by mouth 2 (two) times daily., Disp: 14 each, Rfl: 0 .  senna (SENOKOT) 8.6 MG TABS tablet, Take 1 tablet (8.6 mg total) by mouth daily as needed for mild constipation., Disp: 15 tablet, Rfl: 0 .  spironolactone (ALDACTONE) 25 MG tablet, TAKE 1 TABLET BY MOUTH  DAILY, Disp: 90 tablet, Rfl: 3 .  cyclobenzaprine (FLEXERIL) 10 MG tablet, Take 1  tablet (10 mg total) by mouth 2 (two) times daily as needed for muscle spasms. (Patient not taking: Reported on 05/05/2020), Disp: 10 tablet, Rfl: 0 .  nitroGLYCERIN (NITROSTAT) 0.4 MG SL tablet, Place 1 tablet (0.4 mg total) under the tongue every 5 (five) minutes as needed for chest pain. (Patient not taking: Reported on 05/05/2020), Disp: 30 tablet, Rfl: 1 No Known Allergies    Social History   Socioeconomic History  . Marital status: Single    Spouse name: Not on file  . Number of children: 0  . Years of education: Not on file  . Highest education level: Not on file  Occupational History  . Occupation: Disability  Tobacco Use  . Smoking status: Never Smoker  . Smokeless tobacco: Never Used  Vaping Use  . Vaping Use: Never used  Substance and Sexual Activity  . Alcohol use: No  . Drug use: No  . Sexual activity: Yes    Birth control/protection: None  Other Topics Concern  . Not on file  Social History Narrative   Social History   Diet?    Do you drink/eat things with caffeine? yes   Marital status?       single      Do you live in a house, apartment, assisted living, condo, trailer, etc.? yes   Is it one or more stories? One story   How many persons live in your home?   Do you have any pets in your home? (please list)    Highest level of education completed? graduate   Do you exercise?            no                          Type & how often?   Advanced Directives   Do you have a living will? no   Do you have a DNR form?                                  If not, do you want to discuss one? no   Do you have signed POA/HPOA for forms? no      Functional Status   Do you have difficulty bathing or dressing yourself? no   Do you have difficulty preparing food or eating? no   Do you have difficulty managing your medications? no   Do you have difficulty managing your finances? no   Do you have difficulty affording your medications? no   Social Determinants of Health    Financial Resource Strain:   . Difficulty of Paying Living Expenses: Not on file  Food Insecurity: No Food Insecurity  . Worried About Charity fundraiser in the Last Year: Never true  . Ran Out of Food in the Last Year: Never true  Transportation Needs: Unmet Transportation Needs  . Lack of Transportation (Medical): Yes  . Lack of Transportation (Non-Medical):  Yes  Physical Activity:   . Days of Exercise per Week: Not on file  . Minutes of Exercise per Session: Not on file  Stress:   . Feeling of Stress : Not on file  Social Connections:   . Frequency of Communication with Friends and Family: Not on file  . Frequency of Social Gatherings with Friends and Family: Not on file  . Attends Religious Services: Not on file  . Active Member of Clubs or Organizations: Not on file  . Attends Archivist Meetings: Not on file  . Marital Status: Not on file  Intimate Partner Violence:   . Fear of Current or Ex-Partner: Not on file  . Emotionally Abused: Not on file  . Physically Abused: Not on file  . Sexually Abused: Not on file    Physical Exam      Future Appointments  Date Time Provider [place]  05/11/2020 10:30 AM [name], RN [facility] None  05/19/2020  9:15 AM [name], MD [facility] None  05/25/2020 11:30 AM [name], NP [facility] None  06/05/2020  3:15 PM Felipa Furnace, DPM [facility]  06/09/2020  1:15 PM [name], MD [facility] None    BP (!) 142/80   Pulse 90   Resp 18   Wt 171 lb (77.6 kg)   SpO2 99%   BMI 26.00 kg/m   Weight yesterday-171 Last visit weight-171  Pt reports he is feeling ok. He has been very busy with changing living arrangements.  He denies increased sob, dizziness comes and goes.  No c/p. CBG running better but still elevated.  He has been taking the increased lisinopril without issues.  Filled pill boxes up for 1 month. He has been advised to call sooner if he needed anything.   Marylouise Stacks, [facility] [facility] Paramedic  05/06/20

## 2020-05-11 ENCOUNTER — Other Ambulatory Visit: Payer: Self-pay | Admitting: *Deleted

## 2020-05-11 NOTE — Patient Outreach (Signed)
[facility] [facility]) Care Management  05/11/2020  Zyad Boomer 1961-06-18 [id number]   Call placed to member to follow up on management of diabetes.  State he is still having elevated blood glucose readings, was 330 today.  Report he has had some higher than that, lowest was 145.  He was seen by endocrinologist on 10/20, A1C only slightly decreased down to 9.3.  [name] was increased to 40 units daily.  Although he is reluctant to increasing state he is following the plan (afraid he will have hypoglycemic episodes similar to April when he had to be hospitalized).  Reassurance provided, discussed gradually increasing meds and importance of well balanced diet.  He verbalizes understanding, will have follow up on 12/7.  He also has scheduled eye exam on 11/16 and visit with PCP on 11/22.  [name], EMT was in the home last week, visits are now monthly.  Member denies any urgent concerns, encouraged to contact this care manager with questions.  Will follow up within the next month.  Goals Addressed            This Visit's Progress   . [facility] - Monitor and Manage My Blood Sugar   On track    Follow Up Date 12/15   - check blood sugar at prescribed times - check blood sugar before and after exercise - check blood sugar if I feel it is too high or too low - take the blood sugar log to all doctor visits    Why is this important?   Checking your blood sugar at home helps to keep it from getting very high or very low.  Writing the results in a diary or log helps the doctor know how to care for you.  Your blood sugar log should have the time, date and the results.  Also, write down the amount of insulin or other medicine that you take.  Other information, like what you ate, exercise done and how you were feeling, will also be helpful.     Notes:   11/8 - Reminded to monitor multiple times a day with insulin changes    . [facility] - Set My Target A1C   On track    Follow Up Date  07/18/2020   - set target A1C - Less than 8   Why is this important?   Your target A1C is decided together by you and your doctor.  It is based on several things like your age and other health issues.    Notes:   11/8 - decreased to 9.3 on 10/20      [name], [place], MSN [place] [phone]

## 2020-05-14 NOTE — Progress Notes (Signed)
[facility] Note  05/19/2020     CHIEF COMPLAINT Patient presents for Retina Follow Up   HISTORY OF PRESENT ILLNESS: Calvin [name] is a 59 y.o. male who presents to the clinic today for:  HPI    Retina Follow Up    Patient presents with  Diabetic Retinopathy.  In both eyes.  This started weeks ago.  Severity is moderate.  Duration of weeks.  Since onset it is stable.  I, the attending physician,  performed the HPI with the patient and updated documentation appropriately.          Comments    Pt states vision is the same OU.  Pt denies eye pain or discomfort and denies any new or worsening floaters or fol OU.       Last edited by [name], MD on 05/19/2020  1:02 PM. (History)    Pt states he is seeing an improvement in vision, but when he is out in the sun it seems to be worse   Referring physician: [name], NP [place],  [place] [id number]  HISTORICAL INFORMATION:   Selected notes from the MEDICAL RECORD NUMBER Referred by Dr. [name] Uncontrolled DM II on insulin A1C: 10.1   CURRENT MEDICATIONS: No current outpatient medications on file. (Ophthalmic Drugs)   No current facility-administered medications for this visit. (Ophthalmic Drugs)   Current Outpatient Medications (Other)  Medication Sig  . albuterol (VENTOLIN HFA) 108 (90 Base) MCG/ACT inhaler Inhale 2 puffs into the lungs every 6 (six) hours as needed for wheezing or shortness of breath.  [name] amitriptyline (ELAVIL) 10 MG tablet Take 2 tablets at bed time  . apixaban (ELIQUIS) 5 MG TABS tablet Take 1 tablet (5 mg total) by mouth 2 (two) times daily.  [name] atorvastatin (LIPITOR) 80 MG tablet Take 1 tablet (80 mg total) by mouth daily.  . carvedilol (COREG) 6.25 MG tablet Take 1 tablet (6.25 mg total) by mouth 2 (two) times daily with a meal.  . cyclobenzaprine (FLEXERIL) 10 MG tablet Take 1 tablet (10 mg total) by mouth 2 (two) times daily as needed for  muscle spasms. (Patient not taking: Reported on 05/05/2020)  . donepezil (ARICEPT) 10 MG tablet Take 1 tablet (10 mg total) by mouth at bedtime.  . Evolocumab (REPATHA SURECLICK) 810 MG/ML SOAJ Inject 1 pen into the skin every 14 (fourteen) days.  [name] ezetimibe (ZETIA) 10 MG tablet Take 1 tablet (10 mg total) by mouth daily.  . insulin degludec (TRESIBA FLEXTOUCH) 200 UNIT/ML FlexTouch Pen Inject 40 Units into the skin daily.  . Insulin Syringe-Needle U-100 (INSULIN SYRINGE .3CC/29GX1/2") 29G X 1/2" 0.3 ML MISC Inject 1 Syringe 3 (three) times daily as directed. Check blood sugar three times daily. Dx: E11.9 (Patient taking differently: Inject 1 Syringe as directed See admin instructions. Check blood sugar three times daily. Dx: E11.9 )  . Lancets (ONETOUCH DELICA PLUS [id number]) MISC USE TWICE DAILY  . lidocaine (LMX) 4 % cream Apply 1 application topically 3 (three) times daily as needed.  [name] lisinopril (ZESTRIL) 20 MG tablet Take 1 tablet (20 mg total) by mouth at bedtime.  . nitroGLYCERIN (NITROSTAT) 0.4 MG SL tablet Place 1 tablet (0.4 mg total) under the tongue every 5 (five) minutes as needed for chest pain. (Patient not taking: Reported on 05/05/2020)  . ONETOUCH ULTRA test strip USE TWICE DAILY  . polyethylene glycol (MIRALAX / GLYCOLAX) 17 g packet Take 17 g by mouth  2 (two) times daily.  [name] senna (SENOKOT) 8.6 MG TABS tablet Take 1 tablet (8.6 mg total) by mouth daily as needed for mild constipation.  [name] spironolactone (ALDACTONE) 25 MG tablet TAKE 1 TABLET BY MOUTH  DAILY   No current facility-administered medications for this visit. (Other)      REVIEW OF SYSTEMS: ROS    Positive for: Gastrointestinal, Genitourinary, Musculoskeletal, Endocrine, Cardiovascular, Eyes, Respiratory   Negative for: Constitutional, Neurological, Skin, HENT, Psychiatric, Allergic/Imm, Heme/Lymph   Last edited by Doneen Poisson on 05/19/2020  9:26 AM. (History)       ALLERGIES No Known  Allergies  PAST MEDICAL HISTORY Past Medical History:  Diagnosis Date  . Adenoidal hypertrophy   . Adenomatous colon polyps 2012  . Arthritis   . Diabetes mellitus   . GERD (gastroesophageal reflux disease)   . Heart attack ([place])    While living in [place]  [name] Hx of adenomatous colonic polyps 08/18/2017  . Hyperlipidemia   . Hypertension   . Mild CAD    a. cath in 08/2017 showing mild nonobstructive CAD with scattered 20-30% stenosis.   . Nonischemic cardiomyopathy ([place])    a. EF 20-25% by echo in 09/2017 with cath showing mild CAD. b.  Last echo 12/2017 EF 35-40%, grade 2 DD.  [name] Obesity   . PAF (paroxysmal atrial fibrillation) ([place])   . Sleep apnea    cpap, pt says no longer has   Past Surgical History:  Procedure Laterality Date  . COLONOSCOPY  05/13/11   9 adenomas  . FOREARM SURGERY    . MUSCLE BIOPSY    . RIGHT/LEFT HEART CATH AND CORONARY ANGIOGRAPHY N/A 08/25/2017   Procedure: RIGHT/LEFT HEART CATH AND CORONARY ANGIOGRAPHY;  Surgeon: [name], MD;  Location: [facility];  Service: Cardiovascular;  Laterality: N/A;  . RIGHT/LEFT HEART CATH AND CORONARY ANGIOGRAPHY N/A 10/01/2019   Procedure: RIGHT/LEFT HEART CATH AND CORONARY ANGIOGRAPHY;  Surgeon: [name], MD;  Location: [facility];  Service: Cardiovascular;  Laterality: N/A;    FAMILY HISTORY Family History  Problem Relation Age of Onset  . Heart disease Mother   . Heart attack Mother 33  . Hypertension Mother   . Hyperlipidemia Mother   . Diabetes Mother   . Heart disease Father   . Heart attack Father 8  . Hypertension Father   . Hyperlipidemia Father   . Diabetes Father   . Heart attack Sister 65  . Colon cancer Neg Hx   . Stomach cancer Neg Hx   . Esophageal cancer Neg Hx   . Rectal cancer Neg Hx     SOCIAL HISTORY Social History   Tobacco Use  . Smoking status: Never Smoker  . Smokeless tobacco: Never Used  Vaping Use  . Vaping Use: Never used  Substance Use  Topics  . Alcohol use: No  . Drug use: No         OPHTHALMIC EXAM:  Base Eye Exam    Visual Acuity (Snellen - Linear)      Right Left   Dist [place] 20/40 +1 20/60 -1   Dist ph [place] NI 20/40 -2       Tonometry (Tonopen, 9:32 AM)      Right Left   Pressure 17 17       Pupils      Dark Light Shape React APD   Right 3 2 Round Minimal 0   Left 3 2 Round Minimal 0  Visual Fields      Left Right    Full Full       Extraocular Movement      Right Left    Full Full       Neuro/Psych    Oriented x3: Yes   Mood/Affect: Normal       Dilation    Both eyes: 1.0% Mydriacyl, 2.5% Phenylephrine @ 9:32 AM        Slit Lamp and Fundus Exam    Slit Lamp Exam      Right Left   Lids/Lashes Dermatochalasis - upper lid, Meibomian gland dysfunction Dermatochalasis - upper lid, Meibomian gland dysfunction   Conjunctiva/Sclera nasal and temporal pinguecula, Melanosis nasal and temporal pinguecula, Melanosis   Cornea arcus, 2-3+ Punctate epithelial erosions arcus, 3+ Punctate epithelial erosions   Anterior Chamber Deep and quiet Deep and quiet   Iris Round and dilated, No NVI Round and dilated, No NVI   Lens 2-3+ Nuclear sclerosis, 2-3+ Cortical cataract 2-3+ Nuclear sclerosis, 2-3+ Cortical cataract   Vitreous Vitreous syneresis Vitreous syneresis       Fundus Exam      Right Left   Disc Pink and Sharp, no NVD Pink and Sharp, no NVD, Compact   C/D Ratio 0.2 0.2   Macula Blunted foveal reflex, mild improvement in central edema, scattered exudates/MA/IRH -- improving Blunted foveal reflex, massive central edema with severe exudates nasal to fovea -- mild interval improvement, scattered IRH   Vessels attenuated, Tortuous, ST venule dilated attenuated, Tortuous   Periphery Attached, scattered IRH/DBH, exudate greatest posteriorly    Attached, scattered IRH/DBH, exudate greatest posteriorly             IMAGING AND PROCEDURES  Imaging and Procedures for 05/19/2020  OCT, Retina  - OU - Both Eyes       Right Eye Quality was good. Central Foveal Thickness: 414. Progression has improved. Findings include abnormal foveal contour, intraretinal fluid, intraretinal hyper-reflective material, subretinal hyper-reflective material, vitreomacular adhesion , no SRF (Interval improvement in IRF; interval resolution of SRF).   Left Eye Quality was good. Central Foveal Thickness: 504. Progression has improved. Findings include abnormal foveal contour, intraretinal fluid, intraretinal hyper-reflective material, subretinal fluid (Interval improvement in IRF/SRF).   Notes *Images captured and stored on drive  Diagnosis / Impression:  Massive central DME OU (OS > OD) OD: Interval improvement in IRF; interval resolution of SRF OS: Interval improvement in IRF/SRF  Clinical management:  See below  Abbreviations: NFP - Normal foveal profile. CME - cystoid macular edema. PED - pigment epithelial detachment. IRF - intraretinal fluid. SRF - subretinal fluid. EZ - ellipsoid zone. ERM - epiretinal membrane. ORA - outer retinal atrophy. ORT - outer retinal tubulation. SRHM - subretinal hyper-reflective material. IRHM - intraretinal hyper-reflective material        Intravitreal Injection, Pharmacologic Agent - OD - Right Eye       Time Out 05/19/2020. 10:24 AM. Confirmed correct patient, procedure, site, and patient consented.   Anesthesia Topical anesthesia was used. Anesthetic medications included Lidocaine 2%, Proparacaine 0.5%.   Procedure Preparation included 5% betadine to ocular surface, eyelid speculum. A supplied needle was used.   Injection:  1.25 mg Bevacizumab (AVASTIN) SOLN   NDC: 92426-834-19, Lot: [id number] , Expiration date: 06/30/2020   Route: Intravitreal, Site: Right Eye, Waste: 0 mL  Post-op Post injection exam found visual acuity of at least counting fingers. The patient tolerated the procedure well. There were no complications. The patient received  written and  verbal post procedure care education. Post injection medications were not given.        Intravitreal Injection, Pharmacologic Agent - OS - Left Eye       Time Out 05/19/2020. 10:25 AM. Confirmed correct patient, procedure, site, and patient consented.   Anesthesia Topical anesthesia was used. Anesthetic medications included Lidocaine 2%, Proparacaine 0.5%.   Procedure Preparation included 5% betadine to ocular surface, eyelid speculum. A (32g) needle was used.   Injection:  1.25 mg Bevacizumab (AVASTIN) SOLN   NDC: 84696-295-28, Lot: [id number], Expiration date: 06/19/2020   Route: Intravitreal, Site: Left Eye, Waste: 0.05 mL  Post-op Post injection exam found visual acuity of at least counting fingers. The patient tolerated the procedure well. There were no complications. The patient received written and verbal post procedure care education. Post injection medications were not given.                 ASSESSMENT/PLAN:    ICD-10-CM   1. Severe nonproliferative diabetic retinopathy of both eyes with macular edema associated with type 2 diabetes mellitus (HCC)  N02.7253 Intravitreal Injection, Pharmacologic Agent - OD - Right Eye    Intravitreal Injection, Pharmacologic Agent - OS - Left Eye    Bevacizumab (AVASTIN) SOLN 1.25 mg    Bevacizumab (AVASTIN) SOLN 1.25 mg  2. Retinal edema  H35.81 OCT, Retina - OU - Both Eyes  3. Essential hypertension  I10   4. Hypertensive retinopathy of both eyes  H35.033   5. Combined forms of age-related cataract of both eyes  H25.813     1,2. Severe Non-proliferative diabetic retinopathy w/ DME, OU  - A1c 9.3 on 10.20.21 (down from 9.9)  - s/p IVA OS #1 (09.16.21), IVA OD #1 (09.21.21)             - s/p IVA OU #2 (10.19.21) - exam shows central edema OU, scattered IRH and severe exudates OU -- improving - FA (09.16.21) shows late leaking MA greatest posterior pole; no NV; +peripheral vascular perfusion defects -- may benefit  from peripheral PRP OU - OCT shows interval improvement in DME OU -- improved IRF/IRHM and foveal contour OU - IVA OU #3 today, 11.16.21  - Avastin informed consent obtained, signed and scanned OD, 09.21.21 - Avastin informed consent obtained, signed and scanned, 09.16.21 (OS) - f/u 4 weeks, DFE, OCT, possible injection(s)  3,4. Hypertensive retinopathy OU - discussed importance of tight BP control - monitor  5. Mixed Cataract OU - The symptoms of cataract, surgical options, and treatments and risks were discussed with patient. - discussed diagnosis and progression - visually significant -- will refer for cat eval once DR more stable   Ophthalmic Meds Ordered this visit:  Meds ordered this encounter  Medications  . Bevacizumab (AVASTIN) SOLN 1.25 mg  . Bevacizumab (AVASTIN) SOLN 1.25 mg       Return in about 4 weeks (around 06/16/2020) for f/u NPDR OU, DFE, OCT.  There are no Patient Instructions on file for this visit.   Explained the diagnoses, plan, and follow up with the patient and they expressed understanding.  Patient expressed understanding of the importance of proper follow up care.   This document serves as a record of services personally performed by Gardiner Sleeper, MD, PhD. It was created on their behalf by Estill Bakes, COT an ophthalmic technician. The creation of this record is the provider's dictation and/or activities during the visit.    Electronically signed by: Estill Bakes, COT 11.11.21 @ 1:05 PM  This document serves as a record of services personally performed by Gardiner Sleeper, MD, PhD. It was created on their behalf by San Jetty. Owens Shark, OA an ophthalmic technician. The creation of this record is the provider's dictation and/or activities during the visit.    Electronically signed by: San Jetty. Owens Shark, [place] 11.16.2021 1:05 PM  Gardiner Sleeper, M.D., Ph.D. Diseases & Surgery of the Retina and [place] 05/19/2020    I have reviewed the above documentation for accuracy and completeness, and I agree with the above. Gardiner Sleeper, M.D., Ph.D. 05/19/20 1:05 PM   Abbreviations: M myopia (nearsighted); A astigmatism; H hyperopia (farsighted); P presbyopia; Mrx spectacle prescription;  CTL contact lenses; OD right eye; OS left eye; OU both eyes  XT exotropia; ET esotropia; PEK punctate epithelial keratitis; PEE punctate epithelial erosions; DES dry eye syndrome; MGD meibomian gland dysfunction; ATs artificial tears; PFAT's preservative free artificial tears; [place] nuclear sclerotic cataract; PSC posterior subcapsular cataract; ERM epi-retinal membrane; PVD posterior vitreous detachment; RD retinal detachment; DM diabetes mellitus; DR diabetic retinopathy; NPDR non-proliferative diabetic retinopathy; PDR proliferative diabetic retinopathy; CSME clinically significant macular edema; DME diabetic macular edema; dbh dot blot hemorrhages; CWS cotton wool spot; POAG primary open angle glaucoma; C/D cup-to-disc ratio; HVF humphrey visual field; GVF goldmann visual field; OCT optical coherence tomography; IOP intraocular pressure; BRVO Branch retinal vein occlusion; CRVO central retinal vein occlusion; CRAO central retinal artery occlusion; BRAO branch retinal artery occlusion; RT retinal tear; SB scleral buckle; PPV pars plana vitrectomy; VH Vitreous hemorrhage; PRP panretinal laser photocoagulation; IVK intravitreal kenalog; VMT vitreomacular traction; MH Macular hole;  NVD neovascularization of the disc; NVE neovascularization elsewhere; AREDS age related eye disease study; ARMD age related macular degeneration; POAG primary open angle glaucoma; EBMD epithelial/anterior basement membrane dystrophy; ACIOL anterior chamber intraocular lens; IOL intraocular lens; PCIOL posterior chamber intraocular lens; Phaco/IOL phacoemulsification with intraocular lens placement; [place] photorefractive keratectomy; LASIK laser assisted in situ  keratomileusis; HTN hypertension; DM diabetes mellitus; COPD chronic obstructive pulmonary disease

## 2020-05-19 ENCOUNTER — Other Ambulatory Visit: Payer: Self-pay

## 2020-05-19 ENCOUNTER — Ambulatory Visit (INDEPENDENT_AMBULATORY_CARE_PROVIDER_SITE_OTHER): Payer: Medicare Other | Admitting: Ophthalmology

## 2020-05-19 ENCOUNTER — Encounter (INDEPENDENT_AMBULATORY_CARE_PROVIDER_SITE_OTHER): Payer: Self-pay | Admitting: Ophthalmology

## 2020-05-19 DIAGNOSIS — I10 Essential (primary) hypertension: Secondary | ICD-10-CM

## 2020-05-19 DIAGNOSIS — E113413 Type 2 diabetes mellitus with severe nonproliferative diabetic retinopathy with macular edema, bilateral: Secondary | ICD-10-CM

## 2020-05-19 DIAGNOSIS — H25813 Combined forms of age-related cataract, bilateral: Secondary | ICD-10-CM

## 2020-05-19 DIAGNOSIS — H35033 Hypertensive retinopathy, bilateral: Secondary | ICD-10-CM | POA: Diagnosis not present

## 2020-05-19 DIAGNOSIS — H3581 Retinal edema: Secondary | ICD-10-CM

## 2020-05-19 MED ORDER — BEVACIZUMAB CHEMO INJECTION 1.25MG/0.05ML SYRINGE FOR KALEIDOSCOPE
1.2500 mg | INTRAVITREAL | Status: AC | PRN
  Administered 2020-05-19: 1.25 mg via INTRAVITREAL

## 2020-05-25 ENCOUNTER — Other Ambulatory Visit: Payer: Self-pay

## 2020-05-25 ENCOUNTER — Ambulatory Visit (INDEPENDENT_AMBULATORY_CARE_PROVIDER_SITE_OTHER): Payer: Medicare Other | Admitting: Nurse Practitioner

## 2020-05-25 ENCOUNTER — Encounter: Payer: Self-pay | Admitting: Nurse Practitioner

## 2020-05-25 VITALS — BP 132/80 | HR 86 | Temp 96.9°F | Ht 68.0 in | Wt 168.0 lb

## 2020-05-25 DIAGNOSIS — R630 Anorexia: Secondary | ICD-10-CM

## 2020-05-25 DIAGNOSIS — I480 Paroxysmal atrial fibrillation: Secondary | ICD-10-CM | POA: Diagnosis not present

## 2020-05-25 DIAGNOSIS — E1140 Type 2 diabetes mellitus with diabetic neuropathy, unspecified: Secondary | ICD-10-CM

## 2020-05-25 DIAGNOSIS — I10 Essential (primary) hypertension: Secondary | ICD-10-CM | POA: Diagnosis not present

## 2020-05-25 DIAGNOSIS — E785 Hyperlipidemia, unspecified: Secondary | ICD-10-CM | POA: Diagnosis not present

## 2020-05-25 DIAGNOSIS — I5022 Chronic systolic (congestive) heart failure: Secondary | ICD-10-CM

## 2020-05-25 DIAGNOSIS — K5904 Chronic idiopathic constipation: Secondary | ICD-10-CM

## 2020-05-25 DIAGNOSIS — I739 Peripheral vascular disease, unspecified: Secondary | ICD-10-CM | POA: Insufficient documentation

## 2020-05-25 DIAGNOSIS — Z23 Encounter for immunization: Secondary | ICD-10-CM

## 2020-05-25 DIAGNOSIS — G4700 Insomnia, unspecified: Secondary | ICD-10-CM

## 2020-05-25 DIAGNOSIS — Z794 Long term (current) use of insulin: Secondary | ICD-10-CM

## 2020-05-25 HISTORY — DX: Peripheral vascular disease, unspecified: I73.9

## 2020-05-25 NOTE — Progress Notes (Signed)
Careteam: Patient Care Team: Calvin Chandler, NP as PCP - General (Geriatric Medicine) Calvin Blanks, MD as PCP - Cardiology (Cardiology) [name], Calvin Pascal, MD as PCP - Advanced Heart Failure (Cardiology) Calvin Shin, MD as Consulting Physician (Endocrinology) Calvin David, RN as [place] Management  PLACE OF SERVICE:  [place] Directive information    No Known Allergies  Chief Complaint  Patient presents with  . Medical Management of Chronic Issues    4 month follow-up. Examin left arm for raised areas. Flu vaccine today      HPI: Patient is a 59 y.o. male for routine follow up  Pt with cognitive impairment, systolic heart failure due to NICM with EF 35-40%, HTN, GER, MI in 2015, hyperlipidemia, uncontrolled DM, a fib  CHF- could not tolerate entresto, now on lisinipril and digoxin   AFIB- remains on eliquis for PAF with coreg BID  Hyperlipidemia- continues on lipitor 80 mg daily with repatha (recently out) and zetia   Denies anxiety and depression.  Has a good support system.   Occasional dizziness/lightheaded, takes it easy when this happens and generally resolves. Will then have a headache.   constipation under controlled.   DM- followed by endocrinology, no recent low blood sugars, now blood sugars have been high. Followed by ophthalmology due to retinopathy.   Review of Systems:  Review of Systems  Constitutional: Negative for chills, fever and weight loss.  HENT: Negative for tinnitus.   Respiratory: Negative for cough, sputum production and shortness of breath.   Cardiovascular: Negative for chest pain, palpitations and leg swelling.  Gastrointestinal: Negative for abdominal pain, constipation, diarrhea and heartburn.  Genitourinary: Negative for dysuria, frequency and urgency.  Musculoskeletal: Negative for back pain, falls, joint pain and myalgias.  Skin: Negative.   Neurological: Positive for  dizziness and headaches.  Psychiatric/Behavioral: Negative for depression and memory loss. The patient does not have insomnia.     Past Medical History:  Diagnosis Date  . Adenoidal hypertrophy   . Adenomatous colon polyps 2012  . Arthritis   . Diabetes mellitus   . GERD (gastroesophageal reflux disease)   . Heart attack ([place])    While living in [place]  [name] Hx of adenomatous colonic polyps 08/18/2017  . Hyperlipidemia   . Hypertension   . Mild CAD    a. cath in 08/2017 showing mild nonobstructive CAD with scattered 20-30% stenosis.   . Nonischemic cardiomyopathy ([place])    a. EF 20-25% by echo in 09/2017 with cath showing mild CAD. b.  Last echo 12/2017 EF 35-40%, grade 2 DD.  [name] Obesity   . PAF (paroxysmal atrial fibrillation) ([place])   . Sleep apnea    cpap, pt says no longer has   Past Surgical History:  Procedure Laterality Date  . COLONOSCOPY  05/13/11   9 adenomas  . FOREARM SURGERY    . MUSCLE BIOPSY    . RIGHT/LEFT HEART CATH AND CORONARY ANGIOGRAPHY N/A 08/25/2017   Procedure: RIGHT/LEFT HEART CATH AND CORONARY ANGIOGRAPHY;  Surgeon: Calvin Blanks, MD;  Location: [facility];  Service: Cardiovascular;  Laterality: N/A;  . RIGHT/LEFT HEART CATH AND CORONARY ANGIOGRAPHY N/A 10/01/2019   Procedure: RIGHT/LEFT HEART CATH AND CORONARY ANGIOGRAPHY;  Surgeon: [name], MD;  Location: [facility];  Service: Cardiovascular;  Laterality: N/A;   Social History:   reports that he has never smoked. He has never used smokeless tobacco. He reports that he does  not drink alcohol and does not use drugs.  Family History  Problem Relation Age of Onset  . Heart disease Mother   . Heart attack Mother 52  . Hypertension Mother   . Hyperlipidemia Mother   . Diabetes Mother   . Heart disease Father   . Heart attack Father 42  . Hypertension Father   . Hyperlipidemia Father   . Diabetes Father   . Heart attack Sister 4  . Colon cancer Neg Hx   . Stomach  cancer Neg Hx   . Esophageal cancer Neg Hx   . Rectal cancer Neg Hx     Medications: Patient's Medications  New Prescriptions   No medications on file  Previous Medications   ALBUTEROL (VENTOLIN HFA) 108 (90 BASE) MCG/ACT INHALER    Inhale 2 puffs into the lungs every 6 (six) hours as needed for wheezing or shortness of breath.   AMITRIPTYLINE (ELAVIL) 10 MG TABLET    Take 2 tablets at bed time   APIXABAN (ELIQUIS) 5 MG TABS TABLET    Take 1 tablet (5 mg total) by mouth 2 (two) times daily.   ATORVASTATIN (LIPITOR) 80 MG TABLET    Take 1 tablet (80 mg total) by mouth daily.   CARVEDILOL (COREG) 6.25 MG TABLET    Take 1 tablet (6.25 mg total) by mouth 2 (two) times daily with a meal.   DONEPEZIL (ARICEPT) 10 MG TABLET    Take 1 tablet (10 mg total) by mouth at bedtime.   EVOLOCUMAB (REPATHA SURECLICK) 993 MG/ML SOAJ    Inject 1 pen into the skin every 14 (fourteen) days.   EZETIMIBE (ZETIA) 10 MG TABLET    Take 1 tablet (10 mg total) by mouth daily.   INSULIN DEGLUDEC (TRESIBA FLEXTOUCH) 200 UNIT/ML FLEXTOUCH PEN    Inject 40 Units into the skin daily.   INSULIN SYRINGE-NEEDLE U-100 (INSULIN SYRINGE .3CC/29GX1/2") 29G X 1/2" 0.3 ML MISC    Inject 1 Syringe 3 (three) times daily as directed. Check blood sugar three times daily. Dx: E11.9   LANCETS (ONETOUCH DELICA PLUS [id number]) MISC    USE TWICE DAILY   LIDOCAINE (LMX) 4 % CREAM    Apply 1 application topically 3 (three) times daily as needed.   LISINOPRIL (ZESTRIL) 20 MG TABLET    Take 1 tablet (20 mg total) by mouth at bedtime.   NITROGLYCERIN (NITROSTAT) 0.4 MG SL TABLET    Place 1 tablet (0.4 mg total) under the tongue every 5 (five) minutes as needed for chest pain.   ONETOUCH ULTRA TEST STRIP    USE TWICE DAILY   POLYETHYLENE GLYCOL (MIRALAX / GLYCOLAX) 17 G PACKET    Take 17 g by mouth 2 (two) times daily.   SENNA (SENOKOT) 8.6 MG TABS TABLET    Take 1 tablet (8.6 mg total) by mouth daily as needed for mild constipation.    SPIRONOLACTONE (ALDACTONE) 25 MG TABLET    TAKE 1 TABLET BY MOUTH  DAILY  Modified Medications   No medications on file  Discontinued Medications   CYCLOBENZAPRINE (FLEXERIL) 10 MG TABLET    Take 1 tablet (10 mg total) by mouth 2 (two) times daily as needed for muscle spasms.    Physical Exam:  Vitals:   05/25/20 1134  BP: 132/80  Pulse: 86  Temp: (!) 96.9 F (36.1 C)  SpO2: 99%  Weight: 168 lb (76.2 kg)  Height: 5' 8"  (1.727 m)   Body mass index is 26 kg/m. Wt Readings  from Last 3 Encounters:  05/05/20 171 lb (77.6 kg)  04/27/20 171 lb 8 oz (77.8 kg)  04/22/20 173 lb (78.5 kg)    Physical Exam Constitutional:      General: He is not in acute distress.    Appearance: He is well-developed. He is not diaphoretic.  HENT:     Head: Normocephalic and atraumatic.     Mouth/Throat:     Pharynx: No oropharyngeal exudate.  Eyes:     Conjunctiva/sclera: Conjunctivae normal.     Pupils: Pupils are equal, round, and reactive to light.  Cardiovascular:     Rate and Rhythm: Normal rate and regular rhythm.     Heart sounds: Normal heart sounds.  Pulmonary:     Effort: Pulmonary effort is normal.     Breath sounds: Normal breath sounds.  Abdominal:     General: Bowel sounds are normal.     Palpations: Abdomen is soft.  Musculoskeletal:        General: No tenderness.     Cervical back: Normal range of motion and neck supple.  Skin:    General: Skin is warm and dry.  Neurological:     Mental Status: He is alert and oriented to person, place, and time.    Labs reviewed: Basic Metabolic Panel: Recent Labs    06/17/19 1240 06/17/19 1300 06/17/19 1643 06/17/19 1650 06/28/19 1240 06/28/19 1819 06/29/19 0250 07/13/19 2042 10/16/19 0403 10/17/19 0419 10/21/19 1618 10/25/19 1014 12/24/19 1940 01/20/20 1059 04/27/20 0948  NA 133*  --   --    < >  --    < > 134*   < > 138   < > 138   < > 134* 140 138  K 4.0  --   --    < >  --    < > 4.7   < > 3.9   < > 4.9   < > 4.1  4.1 4.1  CL 97*  --   --    < >  --    < > 96*   < > 103   < > 100   < > 102 108 103  CO2 22  --   --    < >  --    < > 25   < > 26   < > 29   < > 25 22 27   GLUCOSE 254*  --   --    < >  --    < > 143*   < > 146*   < > 162*   < > 246* 228* 180*  BUN 21*  --   --    < >  --    < > 26*   < > 8   < > 17   < > 17 11 12   CREATININE 1.65*  --   --    < >  --    < > 1.00   < > 1.05   < > 1.19   < > 1.53* 1.26* 1.29*  CALCIUM 8.6*  --   --    < >  --    < > 8.6*   < > 8.7*   < > 9.7   < > 9.2 8.7* 8.9  MG 2.4  --   --    < > 2.6*  --  2.0  --  1.9  --   --   --   --   --   --  PHOS 3.1  --   --   --   --   --   --   --   --   --   --   --   --   --   --   TSH  --  1.854 2.428  --   --   --   --   --   --   --  1.72  --   --   --   --    < > = values in this interval not displayed.   Liver Function Tests: Recent Labs    06/29/19 0250 06/29/19 0250 08/28/19 0943 10/13/19 2202 01/20/20 1059  AST 23   < > 12 28 18   ALT 23   < > 12 28 15   ALKPHOS 54  --   --  58 64  BILITOT 0.4   < > 0.4 0.4 0.5  PROT 5.6*   < > 5.8* 6.2* 5.8*  ALBUMIN 2.6*  --   --  3.5 3.2*   < > = values in this interval not displayed.   No results for input(s): LIPASE, AMYLASE in the last 8760 hours. No results for input(s): AMMONIA in the last 8760 hours. CBC: Recent Labs    10/13/19 2202 10/13/19 2211 10/21/19 1618 10/21/19 1618 12/24/19 1940 01/20/20 1059 04/27/20 0948  WBC 4.7   < > 7.2   < > 5.6 5.2 4.7  NEUTROABS 3.6  --  3,982  --   --  2.7  --   HGB 14.7   < > 15.5   < > 14.2 13.3 14.3  HCT 43.6   < > 46.3   < > 42.1 40.2 43.8  MCV 93.0   < > 91.5   < > 89.4 91.8 91.6  PLT 203   < > 338   < > 251 228 224   < > = values in this interval not displayed.   Lipid Panel: No results for input(s): CHOL, HDL, LDLCALC, TRIG, CHOLHDL, LDLDIRECT in the last 8760 hours. TSH: Recent Labs    06/17/19 1300 06/17/19 1643 10/21/19 1618  TSH 1.854 2.428 1.72   A1C: Lab Results  Component Value Date   HGBA1C  9.3 (A) 04/22/2020     Assessment/Plan 1. Need for influenza vaccination - Flu Vaccine QUAD High Dose(Fluad)  2. Type 2 diabetes mellitus with diabetic neuropathy, with long-term current use of insulin (HCC) Elevated blood sugars, no low blood sugars noted, has follow up with endocrine in 2 weeks, encouraged proper diet. He reports he is taking medication as prescribed.  - discussed with the patient the pathophysiology of diabetes and the natural progression of the disease.  -stressed the importance of lifestyle changes including diet and exercise. -discussed complications associated with diabetes including retinopathy (which he already has and is being treated for), nephropathy, neuropathy as well as increased risk of cardiovascular disease. We went over the benefit seen with glycemic control.  - Flu Vaccine QUAD High Dose(Fluad)  3. Essential hypertension -stable on coreg, lisinopril, and aldactone. Continue dietary modifications with medications.  4. Chronic systolic heart failure (HCC) Stable on coreg, lisinopril and aldactone. No LE edema, shortness of breath or chest pains.   5. AF (paroxysmal atrial fibrillation) (HCC) Stable, Rate controlled on coreg. Continues on eliquis for anticoagulation. No signs of bruising or bleeding  6. Insomnia, unspecified type Ongoing, continues on amitriptyline. Discuss good sleep habits and routines.  7. Poor appetite -encouraged to eat routinely  3 nutritions meals a day and not to skip meals. - Hepatic Function Panel  8. Chronic idiopathic constipation -well controlled at this time.  9. Hyperlipidemia, unspecified hyperlipidemia type -will get updated lipids, reports he has been taking lipitor, zetia and raptha as prescribed, dietary modifications encouraged. - Lipid Panel - Hepatic Function Panel  10. Intermittent claudication (HCC) Stable, continues on eliquis for anticoagulation due to a fib with lipitor, zetia and raptha for lipid  control.   Next appt: 4 months Alin Hutchins K. Kendall, [facility] [phone]

## 2020-05-26 LAB — HEPATIC FUNCTION PANEL
AG Ratio: 1.7 (calc) (ref 1.0–2.5)
ALT: 13 U/L (ref 9–46)
AST: 17 U/L (ref 10–35)
Albumin: 4 g/dL (ref 3.6–5.1)
Alkaline phosphatase (APISO): 67 U/L (ref 35–144)
Bilirubin, Direct: 0.1 mg/dL (ref 0.0–0.2)
Globulin: 2.4 g/dL (calc) (ref 1.9–3.7)
Indirect Bilirubin: 0.4 mg/dL (calc) (ref 0.2–1.2)
Total Bilirubin: 0.5 mg/dL (ref 0.2–1.2)
Total Protein: 6.4 g/dL (ref 6.1–8.1)

## 2020-05-26 LAB — LIPID PANEL
Cholesterol: 129 mg/dL (ref <200)
HDL: 54 mg/dL (ref >=40)
LDL Cholesterol (Calc): 58 mg/dL (calc)
Non-HDL Cholesterol (Calc): 75 mg/dL (calc) (ref <130)
Total CHOL/HDL Ratio: 2.4 (calc) (ref <5.0)
Triglycerides: 85 mg/dL (ref <150)

## 2020-05-27 ENCOUNTER — Other Ambulatory Visit: Payer: Self-pay | Admitting: Cardiology

## 2020-06-05 ENCOUNTER — Ambulatory Visit: Payer: Medicare Other | Admitting: Podiatry

## 2020-06-05 ENCOUNTER — Telehealth: Payer: Self-pay

## 2020-06-05 NOTE — Telephone Encounter (Signed)
Calvin [name] called an stated he needs a lab draw HgB A1C drawn and wants an appointment. His last draw was on 01/15/2020. Please advise  I will call him back and schedule it if you approve. Please advise.

## 2020-06-05 NOTE — Telephone Encounter (Signed)
His endocrine doctor generally follows his A1c, he needs to call and get appt with their office.

## 2020-06-05 NOTE — Telephone Encounter (Signed)
I left a message on his VM letting him know and if he has any questions to please call the office.

## 2020-06-08 ENCOUNTER — Emergency Department (HOSPITAL_COMMUNITY): Payer: Medicare Other

## 2020-06-08 ENCOUNTER — Other Ambulatory Visit: Payer: Self-pay

## 2020-06-08 ENCOUNTER — Other Ambulatory Visit (HOSPITAL_COMMUNITY): Payer: Self-pay

## 2020-06-08 ENCOUNTER — Emergency Department (HOSPITAL_COMMUNITY)
Admission: EM | Admit: 2020-06-08 | Discharge: 2020-06-08 | Disposition: A | Discharge status: Home / Self Care | Payer: Medicare Other | Attending: Emergency Medicine | Admitting: Emergency Medicine

## 2020-06-08 DIAGNOSIS — E1140 Type 2 diabetes mellitus with diabetic neuropathy, unspecified: Secondary | ICD-10-CM | POA: Diagnosis not present

## 2020-06-08 DIAGNOSIS — I5022 Chronic systolic (congestive) heart failure: Secondary | ICD-10-CM | POA: Diagnosis not present

## 2020-06-08 DIAGNOSIS — Z8616 Personal history of COVID-19: Secondary | ICD-10-CM | POA: Diagnosis not present

## 2020-06-08 DIAGNOSIS — I110 Hypertensive heart disease with heart failure: Secondary | ICD-10-CM | POA: Diagnosis not present

## 2020-06-08 DIAGNOSIS — I2510 Atherosclerotic heart disease of native coronary artery without angina pectoris: Secondary | ICD-10-CM | POA: Insufficient documentation

## 2020-06-08 DIAGNOSIS — R21 Rash and other nonspecific skin eruption: Secondary | ICD-10-CM

## 2020-06-08 DIAGNOSIS — Z7901 Long term (current) use of anticoagulants: Secondary | ICD-10-CM | POA: Diagnosis not present

## 2020-06-08 DIAGNOSIS — Z794 Long term (current) use of insulin: Secondary | ICD-10-CM | POA: Insufficient documentation

## 2020-06-08 DIAGNOSIS — I252 Old myocardial infarction: Secondary | ICD-10-CM | POA: Diagnosis not present

## 2020-06-08 DIAGNOSIS — R079 Chest pain, unspecified: Secondary | ICD-10-CM | POA: Insufficient documentation

## 2020-06-08 DIAGNOSIS — E11649 Type 2 diabetes mellitus with hypoglycemia without coma: Secondary | ICD-10-CM | POA: Diagnosis not present

## 2020-06-08 LAB — TROPONIN I (HIGH SENSITIVITY)
Troponin I (High Sensitivity): 13 ng/L (ref <18)
Troponin I (High Sensitivity): 12 ng/L (ref <18)

## 2020-06-08 LAB — BASIC METABOLIC PANEL
Anion gap: 9 (ref 5–15)
BUN: 15 mg/dL (ref 6–20)
CO2: 23 mmol/L (ref 22–32)
Calcium: 9 mg/dL (ref 8.9–10.3)
Chloride: 103 mmol/L (ref 98–111)
Creatinine, Ser: 1.42 mg/dL — ABNORMAL HIGH (ref 0.61–1.24)
GFR, Estimated: 57 mL/min — ABNORMAL LOW (ref >60)
Glucose, Bld: 223 mg/dL — ABNORMAL HIGH (ref 70–99)
Potassium: 4.2 mmol/L (ref 3.5–5.1)
Sodium: 135 mmol/L (ref 135–145)

## 2020-06-08 LAB — CBC
HCT: 43.4 % (ref 39.0–52.0)
Hemoglobin: 14.8 g/dL (ref 13.0–17.0)
MCH: 31.2 pg (ref 26.0–34.0)
MCHC: 34.1 g/dL (ref 30.0–36.0)
MCV: 91.6 fL (ref 80.0–100.0)
Platelets: 227 10*3/uL (ref 150–400)
RBC: 4.74 MIL/uL (ref 4.22–5.81)
RDW: 12 % (ref 11.5–15.5)
WBC: 5.8 10*3/uL (ref 4.0–10.5)
nRBC: 0 % (ref 0.0–0.2)

## 2020-06-08 MED ORDER — PREDNISONE 20 MG PO TABS: 20.0000 mg | ORAL_TABLET | Freq: Two times a day (BID) | ORAL | 0 refills | Status: DC

## 2020-06-08 MED ORDER — PREDNISONE 20 MG PO TABS
40.0000 mg | ORAL_TABLET | Freq: Once | ORAL | Status: AC
  Administered 2020-06-08: 40 mg via ORAL
  Filled 2020-06-08: qty 2

## 2020-06-08 NOTE — Discharge Instructions (Addendum)
The chest pain you are having does not appear to be from your heart.  Make sure you continue taking your cardiac medication follow-up with your heart failure doctor as scheduled.  For the rash we are treating you with prednisone.  This should help the itching and discomfort.  This can increase your blood sugar so make sure you are taking all of your medicine as directed, and drink extra water, 1 to 2 L each day.  Also, follow-up with your endocrinologist, tomorrow as scheduled.

## 2020-06-08 NOTE — ED Provider Notes (Signed)
[facility] EMERGENCY DEPARTMENT Provider Note   CSN: [id number] Arrival date & time: 06/08/20  1343     History Chief Complaint  Patient presents with  . Chest Pain    Calvin [name] is a 59 y.o. male.  HPI He presents for evaluation of "constant chest pain for 4 days, which feels like a pulling sensation."  He denies associated fever, chills, cough, shortness of breath, weakness or dizziness.  Also for 3 days he has had a itchy rash of his back, more left than right, which is unusual for him.  No known sick contacts.  He has a follow-up appointment tomorrow with his endocrinologist.  There are no other known modifying factors.    Past Medical History:  Diagnosis Date  . Adenoidal hypertrophy   . Adenomatous colon polyps 2012  . Arthritis   . Diabetes mellitus   . GERD (gastroesophageal reflux disease)   . Heart attack ([place])    [place] living in [place]  [name] Hx of adenomatous colonic polyps 08/18/2017  . Hyperlipidemia   . Hypertension   . Mild CAD    a. cath in 08/2017 showing mild nonobstructive CAD with scattered 20-30% stenosis.   . Nonischemic cardiomyopathy ([place])    a. EF 20-25% by echo in 09/2017 with cath showing mild CAD. b.  Last echo 12/2017 EF 35-40%, grade 2 DD.  [name] Obesity   . PAF (paroxysmal atrial fibrillation) ([place])   . Sleep apnea    cpap, pt says no longer has    Patient Active Problem List   Diagnosis Date Noted  . Intermittent claudication ([place]) 05/25/2020  . Hypoglycemia due to insulin 10/15/2019  . Chronic anticoagulation 10/15/2019  . COVID-19 virus detected 06/18/2019  . Right sided weakness 06/17/2019  . AKI (acute kidney injury) ([place]) 06/17/2019  . Foot pain, bilateral 02/01/2019  . Abdominal pain 01/23/2019  . Chest pain 01/23/2019  . Right leg pain 12/20/2018  . Overgrown toenails 12/20/2018  . Unsteady gait 12/20/2018  . Encounter for therapeutic drug monitoring 10/22/2018  . Nausea and vomiting 03/10/2018  . AF  (paroxysmal atrial fibrillation) ([place]) 03/10/2018  . Non-cardiac chest pain 03/09/2018  . Diabetic neuropathy ([place]) 09/26/2017  . MCI (mild cognitive impairment) 06/23/2017  . OSA on CPAP 05/10/2017  . Gastroesophageal reflux disease 05/10/2017  . Insomnia 05/10/2017  . Onychomycosis of multiple toenails with type 2 diabetes mellitus and peripheral neuropathy ([place]) 05/10/2017  . Chronic systolic heart failure ([place]) 11/22/2015  . Essential hypertension 11/22/2015  . DM (diabetes mellitus) ([place]) 11/20/2015  . HLD (hyperlipidemia) 11/20/2015  . Hx of adenomatous colonic polyps 05/19/2011    Past Surgical History:  Procedure Laterality Date  . COLONOSCOPY  05/13/11   9 adenomas  . FOREARM SURGERY    . MUSCLE BIOPSY    . RIGHT/LEFT HEART CATH AND CORONARY ANGIOGRAPHY N/A 08/25/2017   Procedure: RIGHT/LEFT HEART CATH AND CORONARY ANGIOGRAPHY;  Surgeon: [name], MD;  Location: [facility];  Service: Cardiovascular;  Laterality: N/A;  . RIGHT/LEFT HEART CATH AND CORONARY ANGIOGRAPHY N/A 10/01/2019   Procedure: RIGHT/LEFT HEART CATH AND CORONARY ANGIOGRAPHY;  Surgeon: [name], MD;  Location: [facility];  Service: Cardiovascular;  Laterality: N/A;       Family History  Problem Relation Age of Onset  . Heart disease Mother   . Heart attack Mother 76  . Hypertension Mother   . Hyperlipidemia Mother   . Diabetes Mother   . Heart disease  Father   . Heart attack Father 43  . Hypertension Father   . Hyperlipidemia Father   . Diabetes Father   . Heart attack Sister 14  . Colon cancer Neg Hx   . Stomach cancer Neg Hx   . Esophageal cancer Neg Hx   . Rectal cancer Neg Hx     Social History   Tobacco Use  . Smoking status: Never Smoker  . Smokeless tobacco: Never Used  Vaping Use  . Vaping Use: Never used  Substance Use Topics  . Alcohol use: No  . Drug use: No    Home Medications Prior to Admission medications   Medication Sig Start  Date End Date Taking? Authorizing Provider  albuterol (VENTOLIN HFA) 108 (90 Base) MCG/ACT inhaler Inhale 2 puffs into the lungs every 6 (six) hours as needed for wheezing or shortness of breath. 06/30/19   Thurnell Lose, MD  amitriptyline (ELAVIL) 10 MG tablet Take 2 tablets at bed time 08/27/19   Cameron Sprang, MD  atorvastatin (LIPITOR) 80 MG tablet Take 1 tablet (80 mg total) by mouth daily. 04/20/20 04/15/21  [name], MD  carvedilol (COREG) 6.25 MG tablet Take 1 tablet (6.25 mg total) by mouth 2 (two) times daily with a meal. 07/22/19   Larey Dresser, MD  donepezil (ARICEPT) 10 MG tablet Take 1 tablet (10 mg total) by mouth at bedtime. 08/27/19   Cameron Sprang, MD  ELIQUIS 5 MG TABS tablet TAKE 1 TABLET BY MOUTH TWICE DAILY 05/27/20   Larey Dresser, MD  Evolocumab [facility] SURECLICK) 254 MG/ML SOAJ Inject 1 pen into the skin every 14 (fourteen) days. 07/22/19   [name], MD  ezetimibe (ZETIA) 10 MG tablet Take 1 tablet (10 mg total) by mouth daily. 07/22/19   [name], MD  insulin degludec (TRESIBA FLEXTOUCH) 200 UNIT/ML FlexTouch Pen Inject 40 Units into the skin daily. 04/22/20   [name], MD  Insulin Syringe-Needle U-100 (INSULIN SYRINGE .3CC/29GX1/2") 29G X 1/2" 0.3 ML MISC Inject 1 Syringe 3 (three) times daily as directed. Check blood sugar three times daily. Dx: E11.9 Patient taking differently: Inject 1 Syringe as directed See admin instructions. Check blood sugar three times daily. Dx: E11.9  05/10/17   [name], DO  Lancets (ONETOUCH DELICA PLUS [id number]) [place] 10/30/19   [name], MD  lidocaine (LMX) 4 % cream Apply 1 application topically 3 (three) times daily as needed. 12/24/19   Nils Flack, Mina A, [name]  lisinopril (ZESTRIL) 20 MG tablet Take 1 tablet (20 mg total) by mouth at bedtime. 04/27/20   [name], [name], MD  nitroGLYCERIN (NITROSTAT) 0.4 MG SL tablet Place 1 tablet (0.4 mg total) under the  tongue every 5 (five) minutes as needed for chest pain. Patient not taking: Reported on 06/08/2020 10/27/17   [name], DO  [facility] ULTRA test strip USE TWICE DAILY 10/30/19   [name], MD  polyethylene glycol (MIRALAX / GLYCOLAX) 17 g packet Take 17 g by mouth 2 (two) times daily. 06/30/19   Thurnell Lose, MD  predniSONE (DELTASONE) 20 MG tablet Take 1 tablet (20 mg total) by mouth 2 (two) times daily. 06/08/20   Daleen Bo, MD  senna (SENOKOT) 8.6 MG TABS tablet Take 1 tablet (8.6 mg total) by mouth daily as needed for mild constipation. 06/30/19   Thurnell Lose, MD  spironolactone (ALDACTONE) 25 MG tablet TAKE 1 TABLET BY MOUTH  DAILY 04/21/20   Rosita Fire,  Brittainy M, [name]    Allergies    Patient has no known allergies.  Review of Systems   Review of Systems  All other systems reviewed and are negative.   Physical Exam Updated Vital Signs BP 135/73   Pulse 77   Temp 98.4 F (36.9 C) (Oral)   Resp 20   SpO2 100%   Physical Exam Vitals and nursing note reviewed.  Constitutional:      General: He is not in acute distress.    Appearance: He is well-developed. He is not ill-appearing, toxic-appearing or diaphoretic.  HENT:     Head: Normocephalic and atraumatic.     Right Ear: External ear normal.     Left Ear: External ear normal.  Eyes:     Conjunctiva/sclera: Conjunctivae normal.     Pupils: Pupils are equal, round, and reactive to light.  Neck:     Trachea: Phonation normal.  Cardiovascular:     Rate and Rhythm: Normal rate and regular rhythm.     Heart sounds: Normal heart sounds.  Pulmonary:     Effort: Pulmonary effort is normal.     Breath sounds: Normal breath sounds.  Abdominal:     Palpations: Abdomen is soft.     Tenderness: There is no abdominal tenderness.  Musculoskeletal:        General: Normal range of motion.     Cervical back: Normal range of motion and neck supple.     Right lower leg: No edema.     Left lower leg: No edema.    Skin:    General: Skin is warm and dry.     Comments: Scattered papules, some excoriated, 2 to 3 mm, torso bilaterally anterior and posteriorly.  No areas of vesicles, bleeding, drainage or petechiae.  Neurological:     Mental Status: He is alert and oriented to person, place, and time.     Cranial Nerves: No cranial nerve deficit.     Sensory: No sensory deficit.     Motor: No abnormal muscle tone.     Coordination: Coordination normal.  Psychiatric:        Mood and Affect: Mood normal.        Behavior: Behavior normal.        Thought Content: Thought content normal.        Judgment: Judgment normal.     ED Results / Procedures / Treatments   Labs (all labs ordered are listed, but only abnormal results are displayed) Labs Reviewed  BASIC METABOLIC PANEL - Abnormal; Notable for the following components:      Result Value   Glucose, Bld 223 (*)    Creatinine, Ser 1.42 (*)    GFR, Estimated 57 (*)    All other components within normal limits  CBC  TROPONIN I (HIGH SENSITIVITY)  TROPONIN I (HIGH SENSITIVITY)    EKG EKG Interpretation  Date/Time:  Monday June 08 2020 14:38:32 EST Ventricular Rate:  88 PR Interval:  168 QRS Duration: 76 QT Interval:  362 QTC Calculation: 438 R Axis:   19 Text Interpretation: Normal sinus rhythm Septal infarct , age undetermined T wave abnormality, consider inferolateral ischemia Abnormal ECG Confirmed by Madalyn Rob [phone]) on 06/08/2020 2:53:15 PM   Radiology DG Chest 2 View  Result Date: 06/08/2020 CLINICAL DATA:  Chest pain for 4 days EXAM: CHEST - 2 VIEW COMPARISON:  01/20/2020 FINDINGS: The heart size and mediastinal contours are within normal limits. Both lungs are clear. The visualized skeletal structures are  unremarkable. IMPRESSION: No active cardiopulmonary disease. Electronically Signed   By: Donavan Foil M.D.   On: 06/08/2020 15:19    Procedures Procedures (including critical care time)  Medications Ordered in  ED Medications  predniSONE (DELTASONE) tablet 40 mg (has no administration in time range)    ED Course  I have reviewed the triage vital signs and the nursing notes.  Pertinent labs & imaging results that were available during my care of the patient were reviewed by me and considered in my medical decision making (see chart for details).    MDM Rules/Calculators/A&P                           Patient Vitals for the past 24 hrs:  BP Temp Temp src Pulse Resp SpO2  06/08/20 1723 135/73 98.4 F (36.9 C) Oral 77 20 100 %  06/08/20 1446 111/68 98.2 F (36.8 C) Oral 60 16 99 %    6:14 PM Reevaluation with update and discussion. After initial assessment and treatment, an updated evaluation reveals he is comfortable has no further complaints.  Findings discussed and questions answered. Daleen Bo   Medical Decision Making:  This patient is presenting for evaluation of chest pain and rash, which does require a range of treatment options, and is a complaint that involves a moderate risk of morbidity and mortality. The differential diagnoses include ACS, pneumonia, allergic reaction. I decided to review old records, and in summary patient with history of congestive heart failure presenting with chest pain for 4 days.  I did not require additional historical information from anyone.  Clinical Laboratory Tests Ordered, included CBC, Metabolic panel and Troponin. Review indicates normal findings except mild elevation of glucose and creatinine from baseline.. Radiologic Tests Ordered, included chest x-ray.  I independently Visualized: Radiograph images, which show no infiltrate or edema  Cardiac Monitor Tracing which shows normal sinus rhythm    Critical Interventions-clinical evaluation, laboratory testing, chest x-ray, observation, treatment with prednisone, reassessment  After These Interventions, the Patient was reevaluated and was found stable for discharge.  Nonspecific chest pain,  not likely to indicate an acute coronary process or MI.  Nonspecific rash, likely allergic.  No indication for further ED evaluation at this time.  CRITICAL CARE-no Performed by: Daleen Bo  Nursing Notes Reviewed/ Care Coordinated Applicable Imaging Reviewed Interpretation of Laboratory Data incorporated into ED treatment  The patient appears reasonably screened and/or stabilized for discharge and I doubt any other medical condition or other [employer] requiring further screening, evaluation, or treatment in the ED at this time prior to discharge.  Plan: Home Medications-continue usual; Home Treatments-push oral fluids since on prednisone; return here if the recommended treatment, does not improve the symptoms; Recommended follow up-endocrinology, tomorrow as scheduled, PCP, as needed     Final Clinical Impression(s) / ED Diagnoses Final diagnoses:  Nonspecific chest pain  Rash    Rx / DC Orders ED Discharge Orders         Ordered    predniSONE (DELTASONE) 20 MG tablet  2 times daily,   Status:  Discontinued        06/08/20 1807    predniSONE (DELTASONE) 20 MG tablet  2 times daily        06/08/20 1808           Daleen Bo, MD 06/08/20 1814

## 2020-06-08 NOTE — ED Triage Notes (Signed)
Pt reports for the past 2 days has had left sided cp and pressure that has been intermittent.

## 2020-06-08 NOTE — Progress Notes (Signed)
Paramedicine Encounter    Patient ID: Calvin [name], male    DOB: 04-09-1961, 59 y.o.   MRN: [id number]   [name], NP as PCP - General (Geriatric Medicine) [name], MD as PCP - Cardiology (Cardiology) [name], [name], MD as PCP - Advanced Heart Failure (Cardiology) [name], MD as Consulting Physician (Endocrinology) [name], RN as [place] Management  Patient Active Problem List   Diagnosis Date Noted  . Intermittent claudication ([place]) 05/25/2020  . Hypoglycemia due to insulin 10/15/2019  . Chronic anticoagulation 10/15/2019  . COVID-19 virus detected 06/18/2019  . Right sided weakness 06/17/2019  . AKI (acute kidney injury) ([place]) 06/17/2019  . Foot pain, bilateral 02/01/2019  . Abdominal pain 01/23/2019  . Chest pain 01/23/2019  . Right leg pain 12/20/2018  . Overgrown toenails 12/20/2018  . Unsteady gait 12/20/2018  . Encounter for therapeutic drug monitoring 10/22/2018  . Nausea and vomiting 03/10/2018  . AF (paroxysmal atrial fibrillation) ([place]) 03/10/2018  . Non-cardiac chest pain 03/09/2018  . Diabetic neuropathy ([place]) 09/26/2017  . MCI (mild cognitive impairment) 06/23/2017  . OSA on CPAP 05/10/2017  . Gastroesophageal reflux disease 05/10/2017  . Insomnia 05/10/2017  . Onychomycosis of multiple toenails with type 2 diabetes mellitus and peripheral neuropathy ([place]) 05/10/2017  . Chronic systolic heart failure ([place]) 11/22/2015  . Essential hypertension 11/22/2015  . DM (diabetes mellitus) ([place]) 11/20/2015  . HLD (hyperlipidemia) 11/20/2015  . Hx of adenomatous colonic polyps 05/19/2011    Current Outpatient Medications:  .  albuterol (VENTOLIN HFA) 108 (90 Base) MCG/ACT inhaler, Inhale 2 puffs into the lungs every 6 (six) hours as needed for wheezing or shortness of breath., Disp: 6.7 g, Rfl: 0 .  amitriptyline (ELAVIL) 10 MG tablet, Take 2 tablets at bed time, Disp: 180 tablet,  Rfl: 3 .  atorvastatin (LIPITOR) 80 MG tablet, Take 1 tablet (80 mg total) by mouth daily., Disp: 90 tablet, Rfl: 3 .  carvedilol (COREG) 6.25 MG tablet, Take 1 tablet (6.25 mg total) by mouth 2 (two) times daily with a meal., Disp: 180 tablet, Rfl: 3 .  donepezil (ARICEPT) 10 MG tablet, Take 1 tablet (10 mg total) by mouth at bedtime., Disp: 90 tablet, Rfl: 3 .  ELIQUIS 5 MG TABS tablet, TAKE 1 TABLET BY MOUTH TWICE DAILY, Disp: 60 tablet, Rfl: 5 .  Evolocumab (REPATHA SURECLICK) 409 MG/ML SOAJ, Inject 1 pen into the skin every 14 (fourteen) days., Disp: 6 pen, Rfl: 3 .  ezetimibe (ZETIA) 10 MG tablet, Take 1 tablet (10 mg total) by mouth daily., Disp: 90 tablet, Rfl: 3 .  insulin degludec (TRESIBA FLEXTOUCH) 200 UNIT/ML FlexTouch Pen, Inject 40 Units into the skin daily., Disp: 18 mL, Rfl: 3 .  Insulin Syringe-Needle U-100 (INSULIN SYRINGE .3CC/29GX1/2") 29G X 1/2" 0.3 ML MISC, Inject 1 Syringe 3 (three) times daily as directed. Check blood sugar three times daily. Dx: E11.9 (Patient taking differently: Inject 1 Syringe as directed See admin instructions. Check blood sugar three times daily. Dx: E11.9 ), Disp: 100 each, Rfl: 3 .  Lancets (ONETOUCH DELICA PLUS [id number]) MISC, USE TWICE DAILY, Disp: 200 each, Rfl: 3 .  lidocaine (LMX) 4 % cream, Apply 1 application topically 3 (three) times daily as needed., Disp: 30 g, Rfl: 0 .  lisinopril (ZESTRIL) 20 MG tablet, Take 1 tablet (20 mg total) by mouth at bedtime., Disp: 30 tablet, Rfl: 6 .  ONETOUCH ULTRA test strip, USE TWICE DAILY, Disp: 200 strip,  Rfl: 3 .  polyethylene glycol (MIRALAX / GLYCOLAX) 17 g packet, Take 17 g by mouth 2 (two) times daily., Disp: 14 each, Rfl: 0 .  senna (SENOKOT) 8.6 MG TABS tablet, Take 1 tablet (8.6 mg total) by mouth daily as needed for mild constipation., Disp: 15 tablet, Rfl: 0 .  spironolactone (ALDACTONE) 25 MG tablet, TAKE 1 TABLET BY MOUTH  DAILY, Disp: 90 tablet, Rfl: 3 .  nitroGLYCERIN (NITROSTAT) 0.4 MG SL  tablet, Place 1 tablet (0.4 mg total) under the tongue every 5 (five) minutes as needed for chest pain. (Patient not taking: Reported on 06/08/2020), Disp: 30 tablet, Rfl: 1 No Known Allergies    Social History   Socioeconomic History  . Marital status: Single    Spouse name: Not on file  . Number of children: 0  . Years of education: Not on file  . Highest education level: Not on file  Occupational History  . Occupation: Disability  Tobacco Use  . Smoking status: Never Smoker  . Smokeless tobacco: Never Used  Vaping Use  . Vaping Use: Never used  Substance and Sexual Activity  . Alcohol use: No  . Drug use: No  . Sexual activity: Yes    Birth control/protection: None  Other Topics Concern  . Not on file  Social History Narrative   Social History   Diet?    Do you drink/eat things with caffeine? yes   Marital status?       single      Do you live in a house, apartment, assisted living, condo, trailer, etc.? yes   Is it one or more stories? One story   How many persons live in your home?   Do you have any pets in your home? (please list)    Highest level of education completed? graduate   Do you exercise?            no                          Type & how often?   Advanced Directives   Do you have a living will? no   Do you have a DNR form?                                  If not, do you want to discuss one? no   Do you have signed POA/HPOA for forms? no      Functional Status   Do you have difficulty bathing or dressing yourself? no   Do you have difficulty preparing food or eating? no   Do you have difficulty managing your medications? no   Do you have difficulty managing your finances? no   Do you have difficulty affording your medications? no   Social Determinants of Health   Financial Resource Strain:   . Difficulty of Paying Living Expenses: Not on file  Food Insecurity: No Food Insecurity  . Worried About Charity fundraiser in the Last Year: Never true  .  Ran Out of Food in the Last Year: Never true  Transportation Needs: Unmet Transportation Needs  . Lack of Transportation (Medical): Yes  . Lack of Transportation (Non-Medical): Yes  Physical Activity:   . Days of Exercise per Week: Not on file  . Minutes of Exercise per Session: Not on file  Stress:   . Feeling of Stress : Not  on file  Social Connections:   . Frequency of Communication with Friends and Family: Not on file  . Frequency of Social Gatherings with Friends and Family: Not on file  . Attends Religious Services: Not on file  . Active Member of Clubs or Organizations: Not on file  . Attends Archivist Meetings: Not on file  . Marital Status: Not on file  Intimate Partner Violence:   . Fear of Current or Ex-Partner: Not on file  . Emotionally Abused: Not on file  . Physically Abused: Not on file  . Sexually Abused: Not on file    Physical Exam      Future Appointments  Date Time Provider [place]  06/09/2020  1:15 PM [name], MD [facility] None  06/11/2020  9:30 AM [name], RN [facility] None  06/16/2020  8:00 AM [name], MD [facility] None  06/24/2020  3:15 PM Felipa Furnace, DPM [facility]  09/21/2020 11:30 AM [name], NP [facility] None    BP 114/70   Pulse 86   Resp 18   SpO2 99%  CBG's-300s Weight yesterday-? Last visit weight-171  Pt reports he is doing well, he is staying with friend for now, still trying to find his own place.  He denies increased sob.  No dizziness. He just wakes up tired.  Poor appetite.  CBG's still running high.  He reports still taking insulin.  Pt filled his own pill box last week. Upon checking he had a few errors in there-he added a few extra pills a few of the doses.  That was corrected. He is trying hard to learn his meds, he is doing better and making some progress. Only had enough meds to do the next 2 wks worth.  With the moving he has done he has misplaced the scales  somewhere, he still has them but just has to look for them. He does not feel like his weight is changing.  He has not had the repatha in a few wks. He was told he needs to see that doc again that prescribed it but he isnt sure who told him that,  I told him to call pharmacy first to see if it just needs refills and pharmacy can handle that for him. He also needs amitriptyline and lisinopril refilled.  It looks like the 23m tab of lisinopril was sent to local pharmacy instead of [facility]. The  Pharmacist at [facility] will reach out to [facility] to get that done.  Also updated the address on file for them as well.  I ended up calling [facility] for him and they already had eliquis ready and they are able to get the repatha for refill.    KMarylouise Stacks EHebronCEdward W Sparrow HospitalParamedic  06/08/20

## 2020-06-09 ENCOUNTER — Encounter: Payer: Self-pay | Admitting: Endocrinology

## 2020-06-09 ENCOUNTER — Ambulatory Visit (INDEPENDENT_AMBULATORY_CARE_PROVIDER_SITE_OTHER): Payer: Medicare Other | Admitting: Endocrinology

## 2020-06-09 VITALS — BP 112/72 | HR 94 | Ht 68.0 in | Wt 168.2 lb

## 2020-06-09 DIAGNOSIS — E1140 Type 2 diabetes mellitus with diabetic neuropathy, unspecified: Secondary | ICD-10-CM | POA: Diagnosis not present

## 2020-06-09 DIAGNOSIS — Z794 Long term (current) use of insulin: Secondary | ICD-10-CM

## 2020-06-09 LAB — POCT GLYCOSYLATED HEMOGLOBIN (HGB A1C): Hemoglobin A1C: 8.9 % — AB (ref 4.0–5.6)

## 2020-06-09 LAB — GLUCOSE, POCT (MANUAL RESULT ENTRY): POC Glucose: 259 mg/dl — AB (ref 70–99)

## 2020-06-09 MED ORDER — TRIAMCINOLONE ACETONIDE 0.1 % EX CREA: 1.0000 "application " | TOPICAL_CREAM | Freq: Three times a day (TID) | CUTANEOUS | 1 refills | Status: DC | PRN

## 2020-06-09 MED ORDER — TRESIBA FLEXTOUCH 200 UNIT/ML ~~LOC~~ SOPN
45.0000 [IU] | PEN_INJECTOR | Freq: Every day | SUBCUTANEOUS | 3 refills | Status: DC
Start: 2020-06-09 — End: 2020-08-11

## 2020-06-09 NOTE — Progress Notes (Signed)
Subjective:    Patient ID: Calvin [name], male    DOB: [birth year], 59 y.o.   MRN: [id number]  HPI Pt returns for f/u of diabetes mellitus:  DM type: Insulin-requiring type 2. Dx'ed: 1478 Complications: PN, CAD, CRI, and DR.   Therapy: insulin since soon after dx.  DKA: never Severe hypoglycemia: once, in 2021.   Pancreatitis: never Pancreatic imaging: normal on 2003 CT. SDOH: due to noncompliance, he is not a candidate for multiple daily injections; he is retired; he gets insulin from pt assistance; changes between brands must be minimized, due to LHL.  Other: up to early 2021, he was taking more than 200 units QD; pt requests that insulin be titrated up very slowly, due to h/o severe hypoglycemia.    Interval history: no cbg record, but states cbg's vary from 245-340.  He says he takes insulin as rx'ed.  Past Medical History:  Diagnosis Date  . Adenoidal hypertrophy   . Adenomatous colon polyps 2012  . Arthritis   . Diabetes mellitus   . GERD (gastroesophageal reflux disease)   . Heart attack ([place])    [place]  [name] Hx of adenomatous colonic polyps 08/18/2017  . Hyperlipidemia   . Hypertension   . Mild CAD    a. cath in 08/2017 showing mild nonobstructive CAD with scattered 20-30% stenosis.   . Nonischemic cardiomyopathy ([place])    a. EF 20-25% by echo in 09/2017 with cath showing mild CAD. b.  Last echo 12/2017 EF 35-40%, grade 2 DD.  [name] Obesity   . PAF (paroxysmal atrial fibrillation) ([place])   . Sleep apnea    cpap, pt says no longer has    Past Surgical History:  Procedure Laterality Date  . COLONOSCOPY  05/13/11   9 adenomas  . FOREARM SURGERY    . MUSCLE BIOPSY    . RIGHT/LEFT HEART CATH AND CORONARY ANGIOGRAPHY N/A 08/25/2017   Procedure: RIGHT/LEFT HEART CATH AND CORONARY ANGIOGRAPHY;  Surgeon: [name], MD;  Location: [facility];  Service: Cardiovascular;  Laterality: N/A;  . RIGHT/LEFT HEART CATH AND CORONARY ANGIOGRAPHY N/A  10/01/2019   Procedure: RIGHT/LEFT HEART CATH AND CORONARY ANGIOGRAPHY;  Surgeon: [name], MD;  Location: [facility];  Service: Cardiovascular;  Laterality: N/A;    Social History   Socioeconomic History  . Marital status: Single    Spouse name: Not on file  . Number of children: 0  . Years of education: Not on file  . Highest education level: Not on file  Occupational History  . Occupation: Disability  Tobacco Use  . Smoking status: Never Smoker  . Smokeless tobacco: Never Used  Vaping Use  . Vaping Use: Never used  Substance and Sexual Activity  . Alcohol use: No  . Drug use: No  . Sexual activity: Yes    Birth control/protection: None  Other Topics Concern  . Not on file  Social History Narrative   Social History   Diet?    Do you drink/eat things with caffeine? yes   Marital status?       single      Do you live in a house, apartment, assisted living, condo, trailer, etc.? yes   Is it one or more stories? One story   How many persons live in your home?   Do you have any pets in your home? (please list)    Highest level of education completed? graduate   Do you exercise?  no                          Type & how often?   Advanced Directives   Do you have a living will? no   Do you have a DNR form?                                  If not, do you want to discuss one? no   Do you have signed POA/HPOA for forms? no      Functional Status   Do you have difficulty bathing or dressing yourself? no   Do you have difficulty preparing food or eating? no   Do you have difficulty managing your medications? no   Do you have difficulty managing your finances? no   Do you have difficulty affording your medications? no   Social Determinants of Health   Financial Resource Strain: Not on file  Food Insecurity: No Food Insecurity  . Worried About Charity fundraiser in the Last Year: Never true  . Ran Out of Food in the Last Year: Never true   Transportation Needs: Unmet Transportation Needs  . Lack of Transportation (Medical): Yes  . Lack of Transportation (Non-Medical): Yes  Physical Activity: Not on file  Stress: Not on file  Social Connections: Not on file  Intimate Partner Violence: Not on file    Current Outpatient Medications on File Prior to Visit  Medication Sig Dispense Refill  . albuterol (VENTOLIN HFA) 108 (90 Base) MCG/ACT inhaler Inhale 2 puffs into the lungs every 6 (six) hours as needed for wheezing or shortness of breath. 6.7 g 0  . amitriptyline (ELAVIL) 10 MG tablet Take 2 tablets at bed time 180 tablet 3  . atorvastatin (LIPITOR) 80 MG tablet Take 1 tablet (80 mg total) by mouth daily. 90 tablet 3  . carvedilol (COREG) 6.25 MG tablet Take 1 tablet (6.25 mg total) by mouth 2 (two) times daily with a meal. 180 tablet 3  . donepezil (ARICEPT) 10 MG tablet Take 1 tablet (10 mg total) by mouth at bedtime. 90 tablet 3  . ELIQUIS 5 MG TABS tablet TAKE 1 TABLET BY MOUTH TWICE DAILY 60 tablet 5  . Evolocumab (REPATHA SURECLICK) 329 MG/ML SOAJ Inject 1 pen into the skin every 14 (fourteen) days. 6 pen 3  . ezetimibe (ZETIA) 10 MG tablet Take 1 tablet (10 mg total) by mouth daily. 90 tablet 3  . Insulin Syringe-Needle U-100 (INSULIN SYRINGE .3CC/29GX1/2") 29G X 1/2" 0.3 ML MISC Inject 1 Syringe 3 (three) times daily as directed. Check blood sugar three times daily. Dx: E11.9 (Patient taking differently: Inject 1 Syringe as directed See admin instructions. Check blood sugar three times daily. Dx: E11.9 ) 100 each 3  . Lancets (ONETOUCH DELICA PLUS [id number]) MISC USE TWICE DAILY 200 each 3  . lidocaine (LMX) 4 % cream Apply 1 application topically 3 (three) times daily as needed. 30 g 0  . lisinopril (ZESTRIL) 20 MG tablet Take 1 tablet (20 mg total) by mouth at bedtime. 30 tablet 6  . nitroGLYCERIN (NITROSTAT) 0.4 MG SL tablet Place 1 tablet (0.4 mg total) under the tongue every 5 (five) minutes as needed for chest pain.  30 tablet 1  . ONETOUCH ULTRA test strip USE TWICE DAILY 200 strip 3  . polyethylene glycol (MIRALAX / GLYCOLAX) 17 g packet Take 17 g  by mouth 2 (two) times daily. 14 each 0  . predniSONE (DELTASONE) 20 MG tablet Take 1 tablet (20 mg total) by mouth 2 (two) times daily. 10 tablet 0  . senna (SENOKOT) 8.6 MG TABS tablet Take 1 tablet (8.6 mg total) by mouth daily as needed for mild constipation. 15 tablet 0  . spironolactone (ALDACTONE) 25 MG tablet TAKE 1 TABLET BY MOUTH  DAILY 90 tablet 3   No current facility-administered medications on file prior to visit.    No Known Allergies  Family History  Problem Relation Age of Onset  . Heart disease Mother   . Heart attack Mother 47  . Hypertension Mother   . Hyperlipidemia Mother   . Diabetes Mother   . Heart disease Father   . Heart attack Father 38  . Hypertension Father   . Hyperlipidemia Father   . Diabetes Father   . Heart attack Sister 33  . Colon cancer Neg Hx   . Stomach cancer Neg Hx   . Esophageal cancer Neg Hx   . Rectal cancer Neg Hx     BP 112/72   Pulse 94   Ht 5' 8"  (1.727 m)   Wt 168 lb 3.2 oz (76.3 kg)   SpO2 98%   BMI 25.57 kg/m    Review of Systems He denies hypoglycemia.  He reports itching at the upper back.  Unable to cite precip factor.      Objective:   Physical Exam VITAL SIGNS:  See vs page GENERAL: no distress Pulses: dorsalis pedis intact bilat.   MSK: no deformity of the feet CV: no leg edema Skin:  no ulcer on the feet.  normal color and temp on the feet. Neuro: sensation is intact to touch on the feet   A1c=8.9%     Assessment & Plan:  Insulin-requiring type 2 DM, with CRI: uncontrolled HTN: is noted today  Patient Instructions  Your blood pressure is high today.  Please see your primary care provider soon, to have it rechecked.   Please increase the Tresiba to 45 units daily.   On this type of insulin, you should eat meals on a regular schedule.  If a meal is missed or  significantly delayed, your blood sugar could go low.   check your blood sugar twice a day.  vary the time of day when you check, between before the 3 meals, and at bedtime.  also check if you have symptoms of your blood sugar being too high or too low.  please keep a record of the readings and bring it to your next appointment here (or you can bring the meter itself).  You can write it on any piece of paper.  please call us sooner if your blood sugar goes below 70, or if you have a lot of readings over 200.   I have sent a prescription to your pharmacy, for a skin cream for the itching.  Please come back for a follow-up appointment in 2 months.

## 2020-06-09 NOTE — Patient Instructions (Addendum)
Your blood pressure is high today.  Please see your primary care provider soon, to have it rechecked.   Please increase the Tresiba to 45 units daily.   On this type of insulin, you should eat meals on a regular schedule.  If a meal is missed or significantly delayed, your blood sugar could go low.   check your blood sugar twice a day.  vary the time of day when you check, between before the 3 meals, and at bedtime.  also check if you have symptoms of your blood sugar being too high or too low.  please keep a record of the readings and bring it to your next appointment here (or you can bring the meter itself).  You can write it on any piece of paper.  please call us sooner if your blood sugar goes below 70, or if you have a lot of readings over 200.   I have sent a prescription to your pharmacy, for a skin cream for the itching.  Please come back for a follow-up appointment in 2 months.

## 2020-06-10 ENCOUNTER — Encounter: Payer: Self-pay | Admitting: Nurse Practitioner

## 2020-06-10 ENCOUNTER — Ambulatory Visit (INDEPENDENT_AMBULATORY_CARE_PROVIDER_SITE_OTHER): Payer: Medicare Other | Admitting: Nurse Practitioner

## 2020-06-10 ENCOUNTER — Other Ambulatory Visit: Payer: Self-pay

## 2020-06-10 VITALS — BP 130/74 | HR 77 | Temp 96.9°F | Ht 68.0 in | Wt 167.0 lb

## 2020-06-10 DIAGNOSIS — E1140 Type 2 diabetes mellitus with diabetic neuropathy, unspecified: Secondary | ICD-10-CM

## 2020-06-10 DIAGNOSIS — K5904 Chronic idiopathic constipation: Secondary | ICD-10-CM

## 2020-06-10 DIAGNOSIS — I10 Essential (primary) hypertension: Secondary | ICD-10-CM | POA: Diagnosis not present

## 2020-06-10 DIAGNOSIS — R21 Rash and other nonspecific skin eruption: Secondary | ICD-10-CM | POA: Diagnosis not present

## 2020-06-10 DIAGNOSIS — I5022 Chronic systolic (congestive) heart failure: Secondary | ICD-10-CM

## 2020-06-10 DIAGNOSIS — R079 Chest pain, unspecified: Secondary | ICD-10-CM

## 2020-06-10 DIAGNOSIS — Z794 Long term (current) use of insulin: Secondary | ICD-10-CM

## 2020-06-10 NOTE — Progress Notes (Signed)
Careteam: [name], NP as PCP - General (Geriatric Medicine) [name], MD as PCP - Cardiology (Cardiology) [name], [name], MD as PCP - Advanced Heart Failure (Cardiology) [name], MD as Consulting Physician (Endocrinology) [name], RN as [facility]  PLACE OF SERVICE:  [facility] Directive information Does Patient Have a Medical Advance Directive?: No, Would patient like information on creating a medical advance directive?: Yes (MAU/Ambulatory/Procedural Areas - Information given)  No Known Allergies  Chief Complaint  Patient presents with  . Follow-up    ER follow-up for chest pain, per patient ongoing (although better). Patient also c/o rash on back and neck (endocrinologist rx'ed cream). Patient c/o dizziness, overall not feeling well, no appetite, raised areas of concern on left arm, and fatigue. Discuss need for eye exam. Patient aware he can get shingrix at local pharmacy.      HPI: Patient is a 59 y.o. male for ED follow up.  Reports he is under a lot of stress because he is wanting to find his own place. Living with someone. Keeps saying "I need to find my own place" Denies anxiety or depression but upset and frustrated with finding a place to live. Plans to go to social services.  Went to the ED due to chest pain, heart causes was ruled out and thought to be due to increase in stress.  He also reports he wakes up tired and goes to bed tired.  Appetite is low. Reports he is not eating well.  Will take 2 bites and then he is done.   Noted to have rash in ED, was given prednisone on discharge, saw endocrine yesterday and was given triamcinolone TID but has not started   With chronic headaches- stable on amitriptyline.   DM- sugar stays high, followed by endocrine and adjusted tresiba to 45 units. Does not eat like he should.    Review of Systems:  Review of Systems   Constitutional: Negative for chills, fever and weight loss.  HENT: Negative for tinnitus.   Respiratory: Positive for shortness of breath. Negative for cough and sputum production.   Cardiovascular: Negative for chest pain, palpitations and leg swelling.  Gastrointestinal: Negative for abdominal pain, constipation, diarrhea and heartburn.  Genitourinary: Negative for dysuria, frequency and urgency.  Musculoskeletal: Negative for back pain, falls, joint pain and myalgias.  Skin: Positive for itching and rash.  Neurological: Positive for weakness. Negative for dizziness and headaches.  Psychiatric/Behavioral: Negative for depression and memory loss. The patient does not have insomnia.    Past Medical History:  Diagnosis Date  . Adenoidal hypertrophy   . Adenomatous colon polyps 2012  . Arthritis   . Diabetes mellitus   . GERD (gastroesophageal reflux disease)   . Heart attack ([place])    [place]  [name] Hx of adenomatous colonic polyps 08/18/2017  . Hyperlipidemia   . Hypertension   . Mild CAD    a. cath in 08/2017 showing mild nonobstructive CAD with scattered 20-30% stenosis.   . Nonischemic cardiomyopathy ([place])    a. EF 20-25% by echo in 09/2017 with cath showing mild CAD. b.  Last echo 12/2017 EF 35-40%, grade 2 DD.  [name] Obesity   . PAF (paroxysmal atrial fibrillation) ([place])   . Sleep apnea    cpap, pt says no longer has   Past Surgical History:  Procedure Laterality Date  . COLONOSCOPY  05/13/11   9 adenomas  .  FOREARM SURGERY    . MUSCLE BIOPSY    . RIGHT/LEFT HEART CATH AND CORONARY ANGIOGRAPHY N/A 08/25/2017   Procedure: RIGHT/LEFT HEART CATH AND CORONARY ANGIOGRAPHY;  Surgeon: [name], MD;  Location: [facility];  Service: Cardiovascular;  Laterality: N/A;  . RIGHT/LEFT HEART CATH AND CORONARY ANGIOGRAPHY N/A 10/01/2019   Procedure: RIGHT/LEFT HEART CATH AND CORONARY ANGIOGRAPHY;  Surgeon: [name], MD;  Location: [facility];   Service: Cardiovascular;  Laterality: N/A;   Social History:   reports that he has never smoked. He has never used smokeless tobacco. He reports that he does not drink alcohol and does not use drugs.  Family History  Problem Relation Age of Onset  . Heart disease Mother   . Heart attack Mother 55  . Hypertension Mother   . Hyperlipidemia Mother   . Diabetes Mother   . Heart disease Father   . Heart attack Father 38  . Hypertension Father   . Hyperlipidemia Father   . Diabetes Father   . Heart attack Sister 84  . Colon cancer Neg Hx   . Stomach cancer Neg Hx   . Esophageal cancer Neg Hx   . Rectal cancer Neg Hx     Medications: Patient's Medications  New Prescriptions   No medications on file  Previous Medications   ALBUTEROL (VENTOLIN HFA) 108 (90 BASE) MCG/ACT INHALER    Inhale 2 puffs into the lungs every 6 (six) hours as needed for wheezing or shortness of breath.   AMITRIPTYLINE (ELAVIL) 10 MG TABLET    Take 2 tablets at bed time   ATORVASTATIN (LIPITOR) 80 MG TABLET    Take 1 tablet (80 mg total) by mouth daily.   CARVEDILOL (COREG) 6.25 MG TABLET    Take 1 tablet (6.25 mg total) by mouth 2 (two) times daily with a meal.   DONEPEZIL (ARICEPT) 10 MG TABLET    Take 1 tablet (10 mg total) by mouth at bedtime.   ELIQUIS 5 MG TABS TABLET    TAKE 1 TABLET BY MOUTH TWICE DAILY   EVOLOCUMAB (REPATHA SURECLICK) 960 MG/ML SOAJ    Inject 1 pen into the skin every 14 (fourteen) days.   EZETIMIBE (ZETIA) 10 MG TABLET    Take 1 tablet (10 mg total) by mouth daily.   INSULIN DEGLUDEC (TRESIBA FLEXTOUCH) 200 UNIT/ML FLEXTOUCH PEN    Inject 46 Units into the skin daily.   INSULIN SYRINGE-NEEDLE U-100 (INSULIN SYRINGE .3CC/29GX1/2") 29G X 1/2" 0.3 ML MISC    Inject 1 Syringe 3 (three) times daily as directed. Check blood sugar three times daily. Dx: E11.9   LANCETS (ONETOUCH DELICA PLUS [id number]) MISC    USE TWICE DAILY   LIDOCAINE (LMX) 4 % CREAM    Apply 1 application topically 3  (three) times daily as needed.   LISINOPRIL (ZESTRIL) 20 MG TABLET    Take 1 tablet (20 mg total) by mouth at bedtime.   NITROGLYCERIN (NITROSTAT) 0.4 MG SL TABLET    Place 1 tablet (0.4 mg total) under the tongue every 5 (five) minutes as needed for chest pain.   ONETOUCH ULTRA TEST STRIP    USE TWICE DAILY   POLYETHYLENE GLYCOL (MIRALAX / GLYCOLAX) 17 G PACKET    Take 17 g by mouth 2 (two) times daily.   PREDNISONE (DELTASONE) 20 MG TABLET    Take 1 tablet (20 mg total) by mouth 2 (two) times daily.   SENNA (SENOKOT) 8.6 MG TABS  TABLET    Take 1 tablet (8.6 mg total) by mouth daily as needed for mild constipation.   SPIRONOLACTONE (ALDACTONE) 25 MG TABLET    TAKE 1 TABLET BY MOUTH  DAILY   TRIAMCINOLONE (KENALOG) 0.1 %    Apply 1 application topically 3 (three) times daily as needed. For itching  Modified Medications   No medications on file  Discontinued Medications   No medications on file    Physical Exam:  Vitals:   06/10/20 1010  BP: 130/74  Pulse: 77  Temp: (!) 96.9 F (36.1 C)  TempSrc: Temporal  SpO2: 99%  Weight: 167 lb (75.8 kg)  Height: 5' 8"  (1.727 m)   Body mass index is 25.39 kg/m. Wt Readings from Last 3 Encounters:  06/10/20 167 lb (75.8 kg)  06/09/20 168 lb 3.2 oz (76.3 kg)  05/25/20 168 lb (76.2 kg)    Physical Exam Constitutional:      General: He is not in acute distress.    Appearance: He is well-developed. He is not diaphoretic.  HENT:     Head: Normocephalic and atraumatic.     Mouth/Throat:     Pharynx: No oropharyngeal exudate.  Eyes:     Conjunctiva/sclera: Conjunctivae normal.     Pupils: Pupils are equal, round, and reactive to light.  Cardiovascular:     Rate and Rhythm: Normal rate and regular rhythm.     Heart sounds: Normal heart sounds.  Pulmonary:     Effort: Pulmonary effort is normal.     Breath sounds: Normal breath sounds.  Abdominal:     General: Bowel sounds are normal.     Palpations: Abdomen is soft.  Musculoskeletal:         General: No tenderness.     Cervical back: Normal range of motion and neck supple.  Skin:    General: Skin is warm and dry.     Findings: Rash (small papules noted to left upper posterior torso, excoriation noted. no vesicles, redness or heat noted) present.  Neurological:     Mental Status: He is alert and oriented to person, place, and time.    Labs reviewed: Basic Metabolic Panel: Recent Labs    06/17/19 1240 06/17/19 1300 06/17/19 1643 06/17/19 1650 06/28/19 1240 06/28/19 1819 06/29/19 0250 07/13/19 2042 10/16/19 0403 10/17/19 0419 10/21/19 1618 10/25/19 1014 01/20/20 1059 04/27/20 0948 06/08/20 1451  NA 133*  --   --    < >  --    < > 134*   < > 138   < > 138   < > 140 138 135  K 4.0  --   --    < >  --    < > 4.7   < > 3.9   < > 4.9   < > 4.1 4.1 4.2  CL 97*  --   --    < >  --    < > 96*   < > 103   < > 100   < > 108 103 103  CO2 22  --   --    < >  --    < > 25   < > 26   < > 29   < > 22 27 23   GLUCOSE 254*  --   --    < >  --    < > 143*   < > 146*   < > 162*   < > 228* 180* 223*  BUN 21*  --   --    < >  --    < >  26*   < > 8   < > 17   < > 11 12 15   CREATININE 1.65*  --   --    < >  --    < > 1.00   < > 1.05   < > 1.19   < > 1.26* 1.29* 1.42*  CALCIUM 8.6*  --   --    < >  --    < > 8.6*   < > 8.7*   < > 9.7   < > 8.7* 8.9 9.0  MG 2.4  --   --    < > 2.6*  --  2.0  --  1.9  --   --   --   --   --   --   PHOS 3.1  --   --   --   --   --   --   --   --   --   --   --   --   --   --   TSH  --  1.854 2.428  --   --   --   --   --   --   --  1.72  --   --   --   --    < > = values in this interval not displayed.   Liver Function Tests: Recent Labs    06/29/19 0250 08/28/19 0943 10/13/19 2202 01/20/20 1059 05/25/20 1202  AST 23   < > 28 18 17   ALT 23   < > 28 15 13   ALKPHOS 54  --  58 64  --   BILITOT 0.4   < > 0.4 0.5 0.5  PROT 5.6*   < > 6.2* 5.8* 6.4  ALBUMIN 2.6*  --  3.5 3.2*  --    < > = values in this interval not displayed.   No results  for input(s): LIPASE, AMYLASE in the last 8760 hours. No results for input(s): AMMONIA in the last 8760 hours. CBC: Recent Labs    10/13/19 2202 10/13/19 2211 10/21/19 1618 12/24/19 1940 01/20/20 1059 04/27/20 0948 06/08/20 1451  WBC 4.7   < > 7.2   < > 5.2 4.7 5.8  NEUTROABS 3.6  --  3,982  --  2.7  --   --   HGB 14.7   < > 15.5   < > 13.3 14.3 14.8  HCT 43.6   < > 46.3   < > 40.2 43.8 43.4  MCV 93.0   < > 91.5   < > 91.8 91.6 91.6  PLT 203   < > 338   < > 228 224 227   < > = values in this interval not displayed.   Lipid Panel: Recent Labs    05/25/20 1202  CHOL 129  HDL 54  LDLCALC 58  TRIG 85  CHOLHDL 2.4   TSH: Recent Labs    06/17/19 1300 06/17/19 1643 10/21/19 1618  TSH 1.854 2.428 1.72   A1C: Lab Results  Component Value Date   HGBA1C 8.9 (A) 06/09/2020     Assessment/Plan 1. Type 2 diabetes mellitus with diabetic neuropathy, with long-term current use of insulin ([place]) -educated on importance of proper diet due to diabetes. To make sure to get 3 meals a day. And take medications as prescribed.  - Referral to Nutrition and Diabetes Services for more education on proper nutrition  -Encouraged dietary compliance, routine foot care/monitoring and to keep up with  diabetic eye exams through ophthalmology   2. Essential hypertension -improved from bp yesterday, continue dietary modifications with coreg twice daily and lisinopril  3. Rash Ongoing, was given prednisone in ED and now with triamcinolone cream to use TID   4. Chest pain, unspecified type Improved, thought to be stress/anxiety related.   5. Chronic idiopathic constipation Stable at this time on miralax and senna  6. Chronic systolic heart failure (HCC) Stable, Pt with mild chest pain- improved from ED, no LE edema or weight gain, with some worsening shortness of breath on exertion. Chest xray clear in the ED. Has follow up scheduled with heart failure clinic. Continues on spironolactone,  lisinopril, and coreg. Return precautions discussed.   Next appt: 09/21/2020 Carlos American. [place], [facility] [phone]

## 2020-06-10 NOTE — Patient Instructions (Addendum)
Make sure you are getting 3 meals a day- GOOD protein.  Low sodium diet.  Complex carbohydrates  Nutritionist consult given      Please sign record release for eye doctor.   [facility] in [place], [place] Address: [address] [place], [place] [address] Phone: [phone]

## 2020-06-11 ENCOUNTER — Other Ambulatory Visit: Payer: Self-pay | Admitting: *Deleted

## 2020-06-11 NOTE — Progress Notes (Signed)
[facility] Note  06/16/2020     CHIEF COMPLAINT Patient presents for Retina Follow Up   HISTORY OF PRESENT ILLNESS: Calvin [name] is a 59 y.o. male who presents to the clinic today for:  HPI    Retina Follow Up    Patient presents with  Diabetic Retinopathy.  In both eyes.  This started 4 weeks ago.  I, the attending physician,  performed the HPI with the patient and updated documentation appropriately.          Comments    Patient here for 4 weeks retina follow up for NPDR OU. Patients states vision about the same. Doing good. No eye pain.        Last edited by [name], MD on 06/16/2020  8:36 AM. (History)       Referring physician: [name], NP [place],  [place] [address]  HISTORICAL INFORMATION:   Selected notes from the MEDICAL RECORD NUMBER Referred by Dr. [name] Uncontrolled DM II on insulin A1C: 10.1   CURRENT MEDICATIONS: No current outpatient medications on file. (Ophthalmic Drugs)   No current facility-administered medications for this visit. (Ophthalmic Drugs)   Current Outpatient Medications (Other)  Medication Sig  . albuterol (VENTOLIN HFA) 108 (90 Base) MCG/ACT inhaler Inhale 2 puffs into the lungs every 6 (six) hours as needed for wheezing or shortness of breath.  [name] amitriptyline (ELAVIL) 10 MG tablet Take 2 tablets at bed time  . atorvastatin (LIPITOR) 80 MG tablet Take 1 tablet (80 mg total) by mouth daily.  . carvedilol (COREG) 6.25 MG tablet Take 1 tablet (6.25 mg total) by mouth 2 (two) times daily with a meal.  . donepezil (ARICEPT) 10 MG tablet Take 1 tablet (10 mg total) by mouth at bedtime.  [name] ELIQUIS 5 MG TABS tablet TAKE 1 TABLET BY MOUTH TWICE DAILY  . Evolocumab (REPATHA SURECLICK) 341 MG/ML SOAJ Inject 1 pen into the skin every 14 (fourteen) days.  [name] ezetimibe (ZETIA) 10 MG tablet Take 1 tablet (10 mg total) by mouth daily.  . insulin degludec (TRESIBA FLEXTOUCH) 200  UNIT/ML FlexTouch Pen Inject 46 Units into the skin daily.  . Insulin Syringe-Needle U-100 (INSULIN SYRINGE .3CC/29GX1/2") 29G X 1/2" 0.3 ML MISC Inject 1 Syringe 3 (three) times daily as directed. Check blood sugar three times daily. Dx: E11.9 (Patient taking differently: Inject 1 Syringe as directed See admin instructions. Check blood sugar three times daily. Dx: E11.9 )  . Lancets (ONETOUCH DELICA PLUS PFXTKW40X) MISC USE TWICE DAILY  . lidocaine (LMX) 4 % cream Apply 1 application topically 3 (three) times daily as needed.  [name] lisinopril (ZESTRIL) 20 MG tablet Take 1 tablet (20 mg total) by mouth at bedtime.  . nitroGLYCERIN (NITROSTAT) 0.4 MG SL tablet Place 1 tablet (0.4 mg total) under the tongue every 5 (five) minutes as needed for chest pain.  [name] ULTRA test strip USE TWICE DAILY  . polyethylene glycol (MIRALAX / GLYCOLAX) 17 g packet Take 17 g by mouth 2 (two) times daily.  . predniSONE (DELTASONE) 20 MG tablet Take 1 tablet (20 mg total) by mouth 2 (two) times daily.  [name] senna (SENOKOT) 8.6 MG TABS tablet Take 1 tablet (8.6 mg total) by mouth daily as needed for mild constipation.  [name] spironolactone (ALDACTONE) 25 MG tablet TAKE 1 TABLET BY MOUTH  DAILY  . triamcinolone (KENALOG) 0.1 % Apply 1 application topically 3 (three) times daily as needed. For itching  No current facility-administered medications for this visit. (Other)      REVIEW OF SYSTEMS: ROS    Positive for: Gastrointestinal, Genitourinary, Musculoskeletal, Endocrine, Cardiovascular, Eyes, Respiratory   Negative for: Constitutional, Neurological, Skin, HENT, Psychiatric, Allergic/Imm, Heme/Lymph   Last edited by [name], COA on 06/16/2020  8:14 AM. (History)       ALLERGIES No Known Allergies  PAST MEDICAL HISTORY Past Medical History:  Diagnosis Date  . Adenoidal hypertrophy   . Adenomatous colon polyps 2012  . Arthritis   . Diabetes mellitus   . GERD (gastroesophageal reflux disease)   .  Heart attack ([place])    While living in [place]  [name] Hx of adenomatous colonic polyps 08/18/2017  . Hyperlipidemia   . Hypertension   . Mild CAD    a. cath in 08/2017 showing mild nonobstructive CAD with scattered 20-30% stenosis.   . Nonischemic cardiomyopathy ([place])    a. EF 20-25% by echo in 09/2017 with cath showing mild CAD. b.  Last echo 12/2017 EF 35-40%, grade 2 DD.  [name] Obesity   . PAF (paroxysmal atrial fibrillation) ([place])   . Sleep apnea    cpap, pt says no longer has   Past Surgical History:  Procedure Laterality Date  . COLONOSCOPY  05/13/11   9 adenomas  . FOREARM SURGERY    . MUSCLE BIOPSY    . RIGHT/LEFT HEART CATH AND CORONARY ANGIOGRAPHY N/A 08/25/2017   Procedure: RIGHT/LEFT HEART CATH AND CORONARY ANGIOGRAPHY;  Surgeon: [name], MD;  Location: [facility];  Service: Cardiovascular;  Laterality: N/A;  . RIGHT/LEFT HEART CATH AND CORONARY ANGIOGRAPHY N/A 10/01/2019   Procedure: RIGHT/LEFT HEART CATH AND CORONARY ANGIOGRAPHY;  Surgeon: [name], MD;  Location: [facility];  Service: Cardiovascular;  Laterality: N/A;    FAMILY HISTORY Family History  Problem Relation Age of Onset  . Heart disease Mother   . Heart attack Mother 76  . Hypertension Mother   . Hyperlipidemia Mother   . Diabetes Mother   . Heart disease Father   . Heart attack Father 79  . Hypertension Father   . Hyperlipidemia Father   . Diabetes Father   . Heart attack Sister 23  . Colon cancer Neg Hx   . Stomach cancer Neg Hx   . Esophageal cancer Neg Hx   . Rectal cancer Neg Hx     SOCIAL HISTORY Social History   Tobacco Use  . Smoking status: Never Smoker  . Smokeless tobacco: Never Used  Vaping Use  . Vaping Use: Never used  Substance Use Topics  . Alcohol use: No  . Drug use: No         OPHTHALMIC EXAM:  Base Eye Exam    Visual Acuity (Snellen - Linear)      Right Left   Dist [place] 20/30 20/60 -2   Dist ph [place] NI NI       Tonometry (Tonopen,  8:12 AM)      Right Left   Pressure 20 22       Pupils      Dark Light Shape React APD   Right 3 2 Round Brisk None   Left 3 2 Round Brisk None       Visual Fields (Counting fingers)      Left Right    Full Full       Extraocular Movement      Right Left    Full Full  Neuro/Psych    Oriented x3: Yes   Mood/Affect: Normal       Dilation    Both eyes: 1.0% Mydriacyl, 2.5% Phenylephrine @ 8:12 AM        Slit Lamp and Fundus Exam    Slit Lamp Exam      Right Left   Lids/Lashes Dermatochalasis - upper lid, Meibomian gland dysfunction Dermatochalasis - upper lid, Meibomian gland dysfunction   Conjunctiva/Sclera nasal and temporal pinguecula, Melanosis nasal and temporal pinguecula, Melanosis   Cornea arcus, 2-3+ Punctate epithelial erosions arcus, 3+ Punctate epithelial erosions   Anterior Chamber Deep and quiet Deep and quiet   Iris Round and dilated, No NVI Round and dilated, No NVI   Lens 2-3+ Nuclear sclerosis, 2-3+ Cortical cataract 2-3+ Nuclear sclerosis, 2-3+ Cortical cataract   Vitreous Vitreous syneresis Vitreous syneresis       Fundus Exam      Right Left   Disc Pink and Sharp, no NVD Pink and Sharp, no NVD, Compact   C/D Ratio 0.3 0.2   Macula Blunted foveal reflex, mild improvement in central edema, scattered exudates/MA/IRH -- improving Blunted foveal reflex, massive central edema with severe exudates nasal to fovea -- mild interval improvement, scattered IRH   Vessels attenuated, Tortuous, ST venule dilated attenuated, Tortuous   Periphery Attached, scattered IRH/DBH, exudate greatest posteriorly    Attached, scattered IRH/DBH, exudate greatest posteriorly             IMAGING AND PROCEDURES  Imaging and Procedures for 06/16/2020  OCT, Retina - OU - Both Eyes       Right Eye Quality was good. Central Foveal Thickness: 374. Progression has improved. Findings include abnormal foveal contour, intraretinal fluid, intraretinal hyper-reflective  material, subretinal hyper-reflective material, vitreomacular adhesion , no SRF (Interval improvement in IRF; increased central IRHM).   Left Eye Quality was good. Central Foveal Thickness: 490. Progression has improved. Findings include abnormal foveal contour, intraretinal fluid, intraretinal hyper-reflective material, subretinal fluid (Mild Interval improvement in IRF/edema).   Notes *Images captured and stored on drive  Diagnosis / Impression:  Massive central DME OU (OS > OD) OD: Interval improvement in IRF; increased central IRHM OS: Mild Interval improvement in IRF/edema  Clinical management:  See below  Abbreviations: NFP - Normal foveal profile. CME - cystoid macular edema. PED - pigment epithelial detachment. IRF - intraretinal fluid. SRF - subretinal fluid. EZ - ellipsoid zone. ERM - epiretinal membrane. ORA - outer retinal atrophy. ORT - outer retinal tubulation. SRHM - subretinal hyper-reflective material. IRHM - intraretinal hyper-reflective material        Intravitreal Injection, Pharmacologic Agent - OD - Right Eye       Time Out 06/16/2020. 9:04 AM. Confirmed correct patient, procedure, site, and patient consented.   Anesthesia Topical anesthesia was used. Anesthetic medications included Lidocaine 2%, Proparacaine 0.5%.   Procedure Preparation included 5% betadine to ocular surface, eyelid speculum. A supplied needle was used.   Injection:  1.25 mg Bevacizumab (AVASTIN) 1.5m/0.05mL SOLN   NDC: 582423-536-14 Lot: [id number] , Expiration date: 07/29/2020   Route: Intravitreal, Site: Right Eye, Waste: 0 mL  Post-op Post injection exam found visual acuity of at least counting fingers. The patient tolerated the procedure well. There were no complications. The patient received written and verbal post procedure care education. Post injection medications were not given.        Intravitreal Injection, Pharmacologic Agent - OS - Left Eye       Time  Out 06/16/2020. 9:04 AM. Confirmed  correct patient, procedure, site, and patient consented.   Anesthesia Topical anesthesia was used. Anesthetic medications included Lidocaine 2%, Proparacaine 0.5%.   Procedure Preparation included 5% betadine to ocular surface, eyelid speculum. A supplied needle was used.   Injection:  1.25 mg Bevacizumab (AVASTIN) 1.34m/0.05mL SOLN   NDC: 565681-275-17 Lot: [id number] , Expiration date: 08/12/2020   Route: Intravitreal, Site: Left Eye, Waste: 0 mL  Post-op Post injection exam found visual acuity of at least counting fingers. The patient tolerated the procedure well. There were no complications. The patient received written and verbal post procedure care education. Post injection medications were not given.                 ASSESSMENT/PLAN:    ICD-10-CM   1. Severe nonproliferative diabetic retinopathy of both eyes with macular edema associated with type 2 diabetes mellitus (HCC)  E15/9/2022Intravitreal Injection, Pharmacologic Agent - OD - Right Eye    Intravitreal Injection, Pharmacologic Agent - OS - Left Eye    Bevacizumab (AVASTIN) SOLN 1.25 mg    Bevacizumab (AVASTIN) SOLN 1.25 mg  2. Retinal edema  H35.81 OCT, Retina - OU - Both Eyes  3. Essential hypertension  I10   4. Hypertensive retinopathy of both eyes  H35.033   5. Combined forms of age-related cataract of both eyes  H25.813     1,2. Severe Non-proliferative diabetic retinopathy w/ DME, OU  - A1c 9.3 on 10.20.21 (down from 9.9)  - s/p IVA OS #1 (09.16.21), IVA OD #1 (09.21.21)             - s/p IVA OU #2 (10.19.21), #3 (11.16.21) - exam shows central edema OU, scattered IRH and severe exudates OU -- improving - FA (09.16.21) shows late leaking MA greatest posterior pole; no NV; +peripheral vascular perfusion defects -- may benefit from peripheral PRP OU - OCT shows interval improvement in DME OU -- improved IRF and foveal contour OU - IVA OU #4 today, 12.14.21  - Avastin  informed consent obtained, signed and scanned OD, 09.21.21 - Avastin informed consent obtained, signed and scanned, 09.16.21 (OS) - f/u 4 weeks, DFE, OCT, possible injection(s)  3,4. Hypertensive retinopathy OU - discussed importance of tight BP control - monitor  5. Mixed Cataract OU - The symptoms of cataract, surgical options, and treatments and risks were discussed with patient. - discussed diagnosis and progression - visually significant -- will refer for cat eval once DR more stable   Ophthalmic Meds Ordered this visit:  Meds ordered this encounter  Medications  . Bevacizumab (AVASTIN) SOLN 1.25 mg  . Bevacizumab (AVASTIN) SOLN 1.25 mg       Return in about 4 weeks (around 07/14/2020) for f/u NPDR OU, DFE, OCT.  There are no Patient Instructions on file for this visit.   Explained the diagnoses, plan, and follow up with the patient and they expressed understanding.  Patient expressed understanding of the importance of proper follow up care.   This document serves as a record of services personally performed by B14/05/2021 MD, PhD. It was created on their behalf by AGardiner Sleeper BSan Jetty OA an ophthalmic technician. The creation of this record is the provider's dictation and/or activities during the visit.    Electronically signed by: AOwens Shark BSan Jetty OOwens Shark12.14.2021 9:34 AM  B12.27.2021 M.D., Ph.D. Diseases & Surgery of the Retina and VSpen[place]12/14/2021   I have reviewed the above documentation for accuracy and completeness, and I  agree with the above. Gardiner Sleeper, M.D., Ph.D. 06/16/20 9:34 AM   Abbreviations: M myopia (nearsighted); A astigmatism; H hyperopia (farsighted); P presbyopia; Mrx spectacle prescription;  CTL contact lenses; OD right eye; OS left eye; OU both eyes  XT exotropia; ET esotropia; PEK punctate epithelial keratitis; PEE punctate epithelial erosions; DES dry eye syndrome; MGD meibomian gland dysfunction;  ATs artificial tears; PFAT's preservative free artificial tears; [place] nuclear sclerotic cataract; PSC posterior subcapsular cataract; ERM epi-retinal membrane; PVD posterior vitreous detachment; RD retinal detachment; DM diabetes mellitus; DR diabetic retinopathy; NPDR non-proliferative diabetic retinopathy; PDR proliferative diabetic retinopathy; CSME clinically significant macular edema; DME diabetic macular edema; dbh dot blot hemorrhages; CWS cotton wool spot; POAG primary open angle glaucoma; C/D cup-to-disc ratio; HVF humphrey visual field; GVF goldmann visual field; OCT optical coherence tomography; IOP intraocular pressure; BRVO Branch retinal vein occlusion; CRVO central retinal vein occlusion; CRAO central retinal artery occlusion; BRAO branch retinal artery occlusion; RT retinal tear; SB scleral buckle; PPV pars plana vitrectomy; VH Vitreous hemorrhage; PRP panretinal laser photocoagulation; IVK intravitreal kenalog; VMT vitreomacular traction; MH Macular hole;  NVD neovascularization of the disc; NVE neovascularization elsewhere; AREDS age related eye disease study; ARMD age related macular degeneration; POAG primary open angle glaucoma; EBMD epithelial/anterior basement membrane dystrophy; ACIOL anterior chamber intraocular lens; IOL intraocular lens; PCIOL posterior chamber intraocular lens; Phaco/IOL phacoemulsification with intraocular lens placement; [place] photorefractive keratectomy; LASIK laser assisted in situ keratomileusis; HTN hypertension; DM diabetes mellitus; COPD chronic obstructive pulmonary disease

## 2020-06-11 NOTE — Patient Outreach (Signed)
[facility]) Care Management  06/11/2020  Calvin [name] Nov 17, 1960 [id number]   Outgoing call placed to member, state he is still has intermittent chest discomfort and shortness of breath but is feeling better. He was seen in the ED on 12/6, was not admitted.  Denies any swelling in extremities, weight remains stable at 167 pounds today.  Over the last week, he has had home visit with [name], EMT on 12/6, office visit with endocrinology on 12/7, and office visit at heart failure clinic on 12/8.  Report blood sugars are slightly elevated, today was 282, however he has been on steroids for a rash.  His [name] was increased to 45 units daily.  Several follow up appointment scheduled (Eye doctor 12/14, Nutritionist 12/15, heart failure clinic 12/22, foot doctor 12/22, endocrinology 2/8, and PCP 3/21), denies needing transportation. Discussed ongoing management of his case for chronic disease management, he agrees.  Will place referral to health coach.  Goals Addressed            This Visit's Progress   . [facility] - Monitor and Manage My Blood Sugar   On track    Follow Up Date 07/12/2020   - check blood sugar at prescribed times - check blood sugar before and after exercise - check blood sugar if I feel it is too high or too low - take the blood sugar log to all doctor visits    Why is this important?   Checking your blood sugar at home helps to keep it from getting very high or very low.  Writing the results in a diary or log helps the doctor know how to care for you.  Your blood sugar log should have the time, date and the results.  Also, write down the amount of insulin or other medicine that you take.  Other information, like what you ate, exercise done and how you were feeling, will also be helpful.     Notes:   11/8 - Reminded to monitor multiple times a day with insulin changes  12/9 - Reviewed insulin changes as well as appointment with nutritionist    . [facility]  - Set My Target A1C   On track    Follow Up Date 09/09/2020   - set target A1C - Less than 8   Why is this important?   Your target A1C is decided together by you and your doctor.  It is based on several things like your age and other health issues.    Notes:   11/8 - decreased to 9.3 on 04/22/20  12/9 - decreased to 8.9 in December 2021      [name], [place], MSN [place] [phone]

## 2020-06-16 ENCOUNTER — Ambulatory Visit (INDEPENDENT_AMBULATORY_CARE_PROVIDER_SITE_OTHER): Payer: Medicare Other | Admitting: Ophthalmology

## 2020-06-16 ENCOUNTER — Encounter (INDEPENDENT_AMBULATORY_CARE_PROVIDER_SITE_OTHER): Payer: Self-pay | Admitting: Ophthalmology

## 2020-06-16 ENCOUNTER — Other Ambulatory Visit: Payer: Self-pay

## 2020-06-16 DIAGNOSIS — H3581 Retinal edema: Secondary | ICD-10-CM

## 2020-06-16 DIAGNOSIS — I10 Essential (primary) hypertension: Secondary | ICD-10-CM

## 2020-06-16 DIAGNOSIS — E113413 Type 2 diabetes mellitus with severe nonproliferative diabetic retinopathy with macular edema, bilateral: Secondary | ICD-10-CM

## 2020-06-16 DIAGNOSIS — H35033 Hypertensive retinopathy, bilateral: Secondary | ICD-10-CM

## 2020-06-16 DIAGNOSIS — H25813 Combined forms of age-related cataract, bilateral: Secondary | ICD-10-CM | POA: Diagnosis not present

## 2020-06-16 MED ORDER — BEVACIZUMAB CHEMO INJECTION 1.25MG/0.05ML SYRINGE FOR KALEIDOSCOPE
1.2500 mg | INTRAVITREAL | Status: AC | PRN
  Administered 2020-06-16: 1.25 mg via INTRAVITREAL

## 2020-06-17 ENCOUNTER — Ambulatory Visit: Payer: Medicare Other | Admitting: Dietician

## 2020-06-23 NOTE — Progress Notes (Signed)
ADVANCED HF CLINIC NOTE  Referring Physician: Primary Care: Dr [name]  Primary Cardiologist: Dr Angelena Form  Neurology: Dr Delice Lesch  [facility]: Dr. Haroldine Laws   HPI  [name] is a 59 yo male with history of chronic systolic heart failure due to NICM with EF 35-40%, uncontrolled DMII, GERD, HTN, MI 2015, memory issues, and hyperlipidemia. Of note he was homeless for 6 years but was able to get housing in February of 2020.   Admitted 08/2017 with CP. Underwent LHC/RHC with mild nonobstructive CAD & EF 35-40%.  Readmitted in 10/2017 with chest pain. CTA was negative for PE. He was also admitted in 03/2018 with chest pain. He had a brief episode of A fib and was started on eliquis and carvedilol was increased to 6.25 mg twice a day.  In the past he was on Entresto but this was later stopped due to dizziness. Placed back on lisinopril.   Had sleep study 07/2018 that was normal.   Repeat 2D Echo 01/2019 that showed EF 35-40%, mild LVH, normal RV size and function. No significant valvular dysfunction.   06/2019 Admitted for Covid 19 infection. Was placed on BiPAP but did not require intubation.  Since Covid, he has continued w/ persistent fatigue and persistent dyspnea. Followed by Paramedicine once a week.   Seen by Dr. Angelena Form 08/05/19. Had EKG that showed NSR. No afib. RRR on exam today. He remains on Eliquis for PAF.  I saw him in 3/21 for first time and was very depressed/stressed out and was having a lot of fatigue and SOB. R/L cath in 3/21 with non-obstructive CAD (LAD 65m 60d, LCX 40% diffuse, RCA 30p, 534m RHC well compensated PCWP 8 CI 4.3  Seen in ED 06/08/20 for CP for 4 days and rash. CXR without infiltrates or pulmonary edema, hs-Tn 13-->12, labs unremarkable except elevated creatinine 1.42, EKG stable. CP symptoms resolved and thought to be stress-related. Sent home with prednisone for rash. Has seen PCP since ED visit.  Here for routine f/u with Paramedicine (KJoellen [place] Has ongoing  headaches and CP intermittently,  work up in ED on 12/6 unremarkable for cardiac etiologies. Symptoms subside when he walks and relaxes. Walking 1.5 hours daily, with minimal SOB. Takes breaks as needed. Under a lot of stress at home about housing situation.  No dizziness, edema, orthopnea/PND. Taking all meds. No bleeding with Eliquis. Weights at home ~167 lbs. Weight up 10 lbs. Has been eating a lot of salty foods recently.   Studies Echo 09/27/19 EF 40-45% RV ok.   ECHO 12/2017  EF 35-40% Grade II DD. Moderate global reduction in LV systolic function; moderate   diastolic dysfunction.  ECHO 09/2017  EF 20-25% Grade IDD  R/L cath in 3/21: with non-obstructive CAD (LAD 4062m0d, LCX 40% diffuse, RCA 30p, 58m76mHC well compensated PCWP 8 CI 4.3  LHC/RHC 08/2017  RA 5  PA 22/11 (15)  PCWP 9  CO 6 CI 3/14   Prox RCA to Mid RCA lesion is 30% stenosed.  Mid RCA to Dist RCA lesion is 30% stenosed.  Prox Cx to Dist Cx lesion is 20% stenosed.  Mid LAD lesion is 30% stenosed.  There is mild to moderate left ventricular systolic dysfunction.  LV end diastolic pressure is mildly elevated.  The left ventricular ejection fraction is 35-45% by visual estimate.  There is no mitral valve regurgitation.    SH: Disabled for 6 years due to diabetes. He does not smoke. Denies cocaine abuse.  Past Medical History:  Diagnosis Date  . Adenoidal hypertrophy   . Adenomatous colon polyps 2012  . Arthritis   . Diabetes mellitus   . GERD (gastroesophageal reflux disease)   . Heart attack ([place])    While living in [place]  [name] Hx of adenomatous colonic polyps 08/18/2017  . Hyperlipidemia   . Hypertension   . Mild CAD    a. cath in 08/2017 showing mild nonobstructive CAD with scattered 20-30% stenosis.   . Nonischemic cardiomyopathy ([place])    a. EF 20-25% by echo in 09/2017 with cath showing mild CAD. b.  Last echo 12/2017 EF 35-40%, grade 2 DD.  [name] Obesity   . PAF (paroxysmal atrial fibrillation)  ([place])   . Sleep apnea    cpap, pt says no longer has    Current Outpatient Medications  Medication Sig Dispense Refill  . albuterol (VENTOLIN HFA) 108 (90 Base) MCG/ACT inhaler Inhale 2 puffs into the lungs every 6 (six) hours as needed for wheezing or shortness of breath. 6.7 g 0  . amitriptyline (ELAVIL) 10 MG tablet Take 2 tablets at bed time 180 tablet 3  . atorvastatin (LIPITOR) 80 MG tablet Take 1 tablet (80 mg total) by mouth daily. 90 tablet 3  . carvedilol (COREG) 6.25 MG tablet Take 1 tablet (6.25 mg total) by mouth 2 (two) times daily with a meal. 180 tablet 3  . donepezil (ARICEPT) 10 MG tablet Take 1 tablet (10 mg total) by mouth at bedtime. 90 tablet 3  . ELIQUIS 5 MG TABS tablet TAKE 1 TABLET BY MOUTH TWICE DAILY 60 tablet 5  . Evolocumab (REPATHA SURECLICK) 643 MG/ML SOAJ Inject 1 pen into the skin every 14 (fourteen) days. 6 pen 3  . ezetimibe (ZETIA) 10 MG tablet Take 1 tablet (10 mg total) by mouth daily. 90 tablet 3  . insulin degludec (TRESIBA FLEXTOUCH) 200 UNIT/ML FlexTouch Pen Inject 46 Units into the skin daily. 18 mL 3  . Insulin Syringe-Needle U-100 (INSULIN SYRINGE .3CC/29GX1/2") 29G X 1/2" 0.3 ML MISC Inject 1 Syringe 3 (three) times daily as directed. Check blood sugar three times daily. Dx: E11.9 (Patient taking differently: Inject 1 Syringe as directed See admin instructions. Check blood sugar three times daily. Dx: E11.9) 100 each 3  . Lancets (ONETOUCH DELICA PLUS PIRJJO84Z) MISC USE TWICE DAILY 200 each 3  . lidocaine (LMX) 4 % cream Apply 1 application topically 3 (three) times daily as needed. 30 g 0  . lisinopril (ZESTRIL) 20 MG tablet Take 1 tablet (20 mg total) by mouth at bedtime. 30 tablet 6  . nitroGLYCERIN (NITROSTAT) 0.4 MG SL tablet Place 1 tablet (0.4 mg total) under the tongue every 5 (five) minutes as needed for chest pain. 30 tablet 1  . ONETOUCH ULTRA test strip USE TWICE DAILY 200 strip 3  . polyethylene glycol (MIRALAX / GLYCOLAX) 17 g packet  Take 17 g by mouth 2 (two) times daily. 14 each 0  . predniSONE (DELTASONE) 20 MG tablet Take 1 tablet (20 mg total) by mouth 2 (two) times daily. 10 tablet 0  . senna (SENOKOT) 8.6 MG TABS tablet Take 1 tablet (8.6 mg total) by mouth daily as needed for mild constipation. 15 tablet 0  . spironolactone (ALDACTONE) 25 MG tablet TAKE 1 TABLET BY MOUTH  DAILY 90 tablet 3  . triamcinolone (KENALOG) 0.1 % Apply 1 application topically 3 (three) times daily as needed. For itching 45 g 1  . dapagliflozin propanediol (FARXIGA) 10 MG TABS tablet Take 1 tablet (  10 mg total) by mouth daily before breakfast. 90 tablet 3   No current facility-administered medications for this encounter.    No Known Allergies    Social History   Socioeconomic History  . Marital status: Single    Spouse name: Not on file  . Number of children: 0  . Years of education: Not on file  . Highest education level: Not on file  Occupational History  . Occupation: Disability  Tobacco Use  . Smoking status: Never Smoker  . Smokeless tobacco: Never Used  Vaping Use  . Vaping Use: Never used  Substance and Sexual Activity  . Alcohol use: No  . Drug use: No  . Sexual activity: Yes    Birth control/protection: None  Other Topics Concern  . Not on file  Social History Narrative   Social History   Diet?    Do you drink/eat things with caffeine? yes   Marital status?       single      Do you live in a house, apartment, assisted living, condo, trailer, etc.? yes   Is it one or more stories? One story   How many persons live in your home?   Do you have any pets in your home? (please list)    Highest level of education completed? graduate   Do you exercise?            no                          Type & how often?   Advanced Directives   Do you have a living will? no   Do you have a DNR form?                                  If not, do you want to discuss one? no   Do you have signed POA/HPOA for forms? no       Functional Status   Do you have difficulty bathing or dressing yourself? no   Do you have difficulty preparing food or eating? no   Do you have difficulty managing your medications? no   Do you have difficulty managing your finances? no   Do you have difficulty affording your medications? no   Social Determinants of Health   Financial Resource Strain: Not on file  Food Insecurity: No Food Insecurity  . Worried About Charity fundraiser in the Last Year: Never true  . Ran Out of Food in the Last Year: Never true  Transportation Needs: Unmet Transportation Needs  . Lack of Transportation (Medical): Yes  . Lack of Transportation (Non-Medical): Yes  Physical Activity: Not on file  Stress: Not on file  Social Connections: Not on file  Intimate Partner Violence: Not on file      Family History  Problem Relation Age of Onset  . Heart disease Mother   . Heart attack Mother 21  . Hypertension Mother   . Hyperlipidemia Mother   . Diabetes Mother   . Heart disease Father   . Heart attack Father 64  . Hypertension Father   . Hyperlipidemia Father   . Diabetes Father   . Heart attack Sister 1  . Colon cancer Neg Hx   . Stomach cancer Neg Hx   . Esophageal cancer Neg Hx   . Rectal cancer Neg Hx     Vitals:  06/24/20 1031  BP: 121/83  Pulse: 85  SpO2: 95%  Weight: 80.8 kg (178 lb 3.2 oz)   Wt Readings from Last 3 Encounters:  06/24/20 80.8 kg (178 lb 3.2 oz)  06/10/20 75.8 kg (167 lb)  06/09/20 76.3 kg (168 lb 3.2 oz)   ECG: SR 87 bpm (personally reviewed). ReDs Clip: 39%  PHYSICAL EXAM: General:  NAD. No resp difficulty HEENT: Normal Neck: Supple. No JVD. Carotids 2+ bilat; no bruits. No lymphadenopathy or thryomegaly appreciated. Cor: PMI nondisplaced. Regular rate & rhythm. No rubs, gallops or murmurs. Lungs: Clear Abdomen: Soft, nontender, nondistended. No hepatosplenomegaly. No bruits or masses. Good bowel sounds. Extremities: No cyanosis, clubbing, rash,  edema Neuro: alert & oriented x 3, cranial nerves grossly intact. Moves all 4 extremities w/o difficulty. Affect pleasant.  ASSESSMENT & PLAN: 1. Chronic Systolic /Diastolic Heart Failure, NICM - ECHO 12/2017 EF 35-40%. LHC 2/219 with nonobstructive CAD.  - ECHO 01/2019, EF 35-40% - Echo 09/27/19 EF 40-45% RV ok  - Continue to complain of severe fatigue and dyspnea after COVID 19 infection 06/2019 - R/L cath in 3/21 with non-obstructive CAD (LAD 26m 60d, LCX 40% diffuse, RCA 30p, 540m RHC well compensated PCWP 8 CI 4.3 - NYHA II. Volume status ok on exam. ReDs 37%, weight up 10 lbs secondary to increased dietary salt. - Start Farxiga 10 mg daily.  - Continue lisinopril 20 mg at bedtime.  He was intolerant entresto due to dizziness.  - Continue carvedilol 6.25 mg bid. - Continue spironolactone 12.5 mg daily. Consider increasing spiro at next visit. - BMET, BNP; repeat bmet 7-10 days.  2. HTN - BP stable. - Continue lisinopril 20 mg at bedtime. - labs today.  3. Memory Loss - On Aricept per Neurology.  - Paramedicine to continue to help with medications.   4. 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 - Management per Dr. ElLoanne DrillingTrTyler Aasust increased. - Start [place] 5. CAD:  - mild nonobstructive CAD by cath 08/2017 & 3/21 - CP atypical given recent cath & unremarkable work-up in ED, doubt this is ischemic in nature. Discussed with Dr. BeHaroldine Laws- no ASA w/ Eliquis.  - continue  blocker. - on statin, repatha and zetia -> lipids managed by PCP  6. PAF - Eliquis for a/c. Denies abnormal bleeding.  - CBC, EKG today.  7. Fatigue - sleep study ordered in 2020 but not completed. - schedule sleep study.  8. Headache - Likely multifactorial, appears stress-related. - Continue amitriptyline. - Followed by Neurology. - Encouraged headache journal   Continue with paramedicine. Follow back in 4  months with Dr. BeHaroldine Laws JeWhite HavenFNP-BC 06/24/20 2:48 PM

## 2020-06-24 ENCOUNTER — Encounter (HOSPITAL_COMMUNITY): Payer: Self-pay

## 2020-06-24 ENCOUNTER — Other Ambulatory Visit: Payer: Self-pay

## 2020-06-24 ENCOUNTER — Ambulatory Visit (HOSPITAL_COMMUNITY)
Admission: RE | Admit: 2020-06-24 | Discharge: 2020-06-24 | Disposition: A | Discharge status: Home / Self Care | Payer: Medicare Other | Source: Ambulatory Visit | Attending: Internal Medicine | Admitting: Internal Medicine

## 2020-06-24 ENCOUNTER — Ambulatory Visit: Payer: Medicare Other | Admitting: Podiatry

## 2020-06-24 ENCOUNTER — Other Ambulatory Visit (HOSPITAL_COMMUNITY): Payer: Self-pay

## 2020-06-24 VITALS — BP 121/83 | HR 85 | Wt 178.2 lb

## 2020-06-24 DIAGNOSIS — I428 Other cardiomyopathies: Secondary | ICD-10-CM | POA: Insufficient documentation

## 2020-06-24 DIAGNOSIS — Z79899 Other long term (current) drug therapy: Secondary | ICD-10-CM | POA: Insufficient documentation

## 2020-06-24 DIAGNOSIS — I5022 Chronic systolic (congestive) heart failure: Secondary | ICD-10-CM

## 2020-06-24 DIAGNOSIS — R5382 Chronic fatigue, unspecified: Secondary | ICD-10-CM

## 2020-06-24 DIAGNOSIS — I130 Hypertensive heart and chronic kidney disease with heart failure and stage 1 through stage 4 chronic kidney disease, or unspecified chronic kidney disease: Secondary | ICD-10-CM | POA: Diagnosis not present

## 2020-06-24 DIAGNOSIS — E1122 Type 2 diabetes mellitus with diabetic chronic kidney disease: Secondary | ICD-10-CM | POA: Diagnosis not present

## 2020-06-24 DIAGNOSIS — Z7901 Long term (current) use of anticoagulants: Secondary | ICD-10-CM | POA: Insufficient documentation

## 2020-06-24 DIAGNOSIS — Z794 Long term (current) use of insulin: Secondary | ICD-10-CM | POA: Diagnosis not present

## 2020-06-24 DIAGNOSIS — Z8616 Personal history of COVID-19: Secondary | ICD-10-CM | POA: Diagnosis not present

## 2020-06-24 DIAGNOSIS — R519 Headache, unspecified: Secondary | ICD-10-CM | POA: Diagnosis not present

## 2020-06-24 DIAGNOSIS — I2510 Atherosclerotic heart disease of native coronary artery without angina pectoris: Secondary | ICD-10-CM | POA: Insufficient documentation

## 2020-06-24 DIAGNOSIS — R413 Other amnesia: Secondary | ICD-10-CM | POA: Diagnosis not present

## 2020-06-24 DIAGNOSIS — I480 Paroxysmal atrial fibrillation: Secondary | ICD-10-CM | POA: Insufficient documentation

## 2020-06-24 DIAGNOSIS — I5042 Chronic combined systolic (congestive) and diastolic (congestive) heart failure: Secondary | ICD-10-CM | POA: Diagnosis not present

## 2020-06-24 DIAGNOSIS — R5383 Other fatigue: Secondary | ICD-10-CM | POA: Diagnosis not present

## 2020-06-24 DIAGNOSIS — I10 Essential (primary) hypertension: Secondary | ICD-10-CM | POA: Diagnosis not present

## 2020-06-24 DIAGNOSIS — E785 Hyperlipidemia, unspecified: Secondary | ICD-10-CM | POA: Diagnosis not present

## 2020-06-24 DIAGNOSIS — G44219 Episodic tension-type headache, not intractable: Secondary | ICD-10-CM

## 2020-06-24 DIAGNOSIS — E1140 Type 2 diabetes mellitus with diabetic neuropathy, unspecified: Secondary | ICD-10-CM

## 2020-06-24 LAB — CBC
HCT: 43 % (ref 39.0–52.0)
Hemoglobin: 14.7 g/dL (ref 13.0–17.0)
MCH: 30.4 pg (ref 26.0–34.0)
MCHC: 34.2 g/dL (ref 30.0–36.0)
MCV: 89 fL (ref 80.0–100.0)
Platelets: 240 10*3/uL (ref 150–400)
RBC: 4.83 MIL/uL (ref 4.22–5.81)
RDW: 12.3 % (ref 11.5–15.5)
WBC: 5.2 10*3/uL (ref 4.0–10.5)
nRBC: 0 % (ref 0.0–0.2)

## 2020-06-24 LAB — BASIC METABOLIC PANEL
Anion gap: 10 (ref 5–15)
BUN: 18 mg/dL (ref 6–20)
CO2: 26 mmol/L (ref 22–32)
Calcium: 9 mg/dL (ref 8.9–10.3)
Chloride: 103 mmol/L (ref 98–111)
Creatinine, Ser: 1.36 mg/dL — ABNORMAL HIGH (ref 0.61–1.24)
GFR, Estimated: 60 mL/min — ABNORMAL LOW (ref >60)
Glucose, Bld: 159 mg/dL — ABNORMAL HIGH (ref 70–99)
Potassium: 4.5 mmol/L (ref 3.5–5.1)
Sodium: 139 mmol/L (ref 135–145)

## 2020-06-24 LAB — BRAIN NATRIURETIC PEPTIDE: B Natriuretic Peptide: 113.5 pg/mL — ABNORMAL HIGH (ref 0.0–100.0)

## 2020-06-24 MED ORDER — DAPAGLIFLOZIN PROPANEDIOL 10 MG PO TABS: 10.0000 mg | ORAL_TABLET | Freq: Every day | ORAL | 3 refills | Status: DC

## 2020-06-24 NOTE — Progress Notes (Signed)
ReDS Vest / Clip - 06/24/20 1000      ReDS Vest / Clip   Station Marker C    Ruler Value 33    ReDS Value Range Moderate volume overload    ReDS Actual Value 39

## 2020-06-24 NOTE — Progress Notes (Signed)
Paramedicine Encounter   Patient ID: Calvin [name] , male,   DOB: March 12, 1961,59 y.o.,  MRN: [id number]   Met patient in clinic today with provider.  Weight @ clinic-178 B/p-121/83 p-85 sp02-95 REDS CLIP- 39%  Pt here for f/u-he had c/p the other week and went to hosp.  His weight is up from last clinic visit. But he reports drinking a lot of fluid and eating high sodium foods.  Pt is being put on farxiga. Labs today and labs again next week.  Will get that sent to local pharmacy for now and get it moved to mail order. He says his insurance is changing to [place].  meds verified and pill box refilled for 2 wks worth.  He is out of his amitriptylline.  Not sleeping well. Advised he could try melatonin and see how that works for him.  Once we see what kind of insurance he will be getting in January we will look back at his coverage and will have to move pharmacies-he will think about [facility].   Marylouise Stacks, [place] 06/24/2020

## 2020-06-24 NOTE — Patient Instructions (Addendum)
START Farxiga 46m (1 tablet) Daily  Labs done today, your results will be available in MyChart, we will contact you for abnormal readings.  Your physician recommends that you schedule repeat labs in 7-10 days  Please call our office in March 2022 to schedule your follow up appointment  If you have any questions or concerns before your next appointment please send [place] message through [place]or call our office at [phone]    TO LEAVE A MESSAGE FOR THE NURSE SELECT OPTION 2, PLEASE LEAVE A MESSAGE INCLUDING: . YOUR NAME . DATE OF BIRTH . CALL BACK NUMBER . REASON FOR CALL**this is important as we prioritize the call backs  YOU WILL RECEIVE A CALL BACK THE SAME DAY AS LONG AS YOU CALL BEFORE 4:00 PM

## 2020-07-01 ENCOUNTER — Encounter: Payer: Medicare Other | Attending: Nurse Practitioner | Admitting: Dietician

## 2020-07-01 ENCOUNTER — Other Ambulatory Visit: Payer: Self-pay

## 2020-07-01 ENCOUNTER — Other Ambulatory Visit (HOSPITAL_COMMUNITY): Payer: Medicare Other

## 2020-07-01 ENCOUNTER — Encounter: Payer: Self-pay | Admitting: Dietician

## 2020-07-01 ENCOUNTER — Telehealth (HOSPITAL_COMMUNITY): Payer: Self-pay | Admitting: Family Medicine

## 2020-07-01 DIAGNOSIS — Z794 Long term (current) use of insulin: Secondary | ICD-10-CM | POA: Diagnosis not present

## 2020-07-01 DIAGNOSIS — E1140 Type 2 diabetes mellitus with diabetic neuropathy, unspecified: Secondary | ICD-10-CM | POA: Diagnosis not present

## 2020-07-01 NOTE — Patient Instructions (Addendum)
Have a boiled egg for a snack!  Work towards having less regular sodas and more zero sodas.  Try drinking more water every day.  Try to recognize carbs in the food choices you make every day.  Consider taking a Men's Daily multivitamin.

## 2020-07-01 NOTE — Telephone Encounter (Signed)
Received call from Berlinda Last, RD with [facility] that Mr. Zehren had disclosed stroke-like symptoms to him during appointment today; was having intermittent numbness and weakness on left side of body. Called Mr. Bagnell and advised him to go to the ED for evaluation. He understood instructions and was agreeable. He was scheduled for labs at our office today, will reschedule.

## 2020-07-01 NOTE — Progress Notes (Signed)
Diabetes Self-Management Education  Visit Type: First/Initial  Appt. Start Time: 1100 Appt. End Time: 1200  07/01/2020  Mr. Calvin [name], identified by name and date of birth, is a 59 y.o. male with a diagnosis of Diabetes: Type 2.   ASSESSMENT Pt reports ED. Pt is also looking to find a dentist. Pt reports seeing a podiatrist once a month, but doesn't check his feet other than that. Pt reports being out of his apartment for the last year Pt states that he is frustrated about his living condition, living with different family members. Pt reports being homeless in the past, but has secure housing now. Pt reports food insecurity. Pt is on food stamps and only gets $18 a month. Pt talked to social services to increase his benefits, but they will not increase them due to his income/expenses ratio.  Pt lives with his family, and they will ensure that he is able to eat.  Pt is currently taking prednisone. Pt takes [place] daily, injects in the morning. Takes Farxiga, lisinopril, eliquis. Pt reports waking up lightheaded, dizzy, or weak. BG readings are between 200-300 when pt feels these symptoms. Pt reports blurry vision in the evening.  Pt states he drinks maybe 3 bottles of water a week, drinks a lot sodas. Pt states he drinks 2 "Zero" sodas, and 4 "regular" a day. Pt states his favorite soda is strawberry.  Pt reports that one side of his body "gives out" about 2 times a week. States that is lasts about an hour. Pt states his family has history of strokes. Dieititan informed pts cardiologist, and pt was advised to go to the ED for screening.  Height 5' 2"  (1.575 m), weight 174 lb 1.6 oz (79 kg). Body mass index is 31.84 kg/m.   Diabetes Self-Management Education - 07/01/20 1123      Visit Information   Visit Type First/Initial      Initial Visit   Diabetes Type Type 2    Are you currently following a meal plan? No    Are you taking your medications as prescribed? Yes    Date  Diagnosed 2000      Health Coping   How would you rate your overall health? Poor      Psychosocial Assessment   Patient Belief/Attitude about Diabetes Denial    Self-care barriers Lack of transportation;Other (comment)   Living with family members in alternating houses.   Self-management support Doctor's office;Family    Other persons present Patient    Patient Concerns Nutrition/Meal planning    Special Needs None    Preferred Learning Style No preference indicated    Learning Readiness Not Ready    How often do you need to have someone help you when you read instructions, pamphlets, or other written materials from your doctor or pharmacy? 2 - Rarely      Pre-Education Assessment   Patient understands the diabetes disease and treatment process. Needs Instruction    Patient understands incorporating nutritional management into lifestyle. Needs Instruction    Patient undertands incorporating physical activity into lifestyle. Needs Instruction    Patient understands using medications safely. Needs Instruction    Patient understands monitoring blood glucose, interpreting and using results Needs Instruction    Patient understands prevention, detection, and treatment of acute complications. Needs Instruction    Patient understands prevention, detection, and treatment of chronic complications. Needs Instruction    Patient understands how to develop strategies to address psychosocial issues. Needs Instruction    Patient  understands how to develop strategies to promote health/change behavior. Needs Instruction      Complications   Last HgB A1C per patient/outside source 8.9 %   06/11/2020   How often do you check your blood sugar? 1-2 times/day    Fasting Blood glucose range (mg/dL) >200    Postprandial Blood glucose range (mg/dL) >200   Checks at lunchtime   Number of hypoglycemic episodes per month 0    Number of hyperglycemic episodes per week 14    Can you tell when your blood sugar is  high? Yes    Have you had a dilated eye exam in the past 12 months? Yes    Have you had a dental exam in the past 12 months? No    Are you checking your feet? Yes    How many days per week are you checking your feet? 1   Pt sees a podiatrist monthly     Dietary Intake   Breakfast egg and cheerwine    Snack (morning) none    Lunch hotdog, chili fries, sprite    Snack (afternoon) corn chips    Dinner fish, hamburger, zero soda    Snack (evening) carrot cake, 2 sodas    Beverage(s) soda, water      Exercise   Exercise Type ADL's;Light (walking / raking leaves)    How many days per week to you exercise? 0    How many minutes per day do you exercise? 0    Total minutes per week of exercise 0      Patient Education   Previous Diabetes Education No    Disease state  Definition of diabetes, type 1 and 2, and the diagnosis of diabetes    Nutrition management  Role of diet in the treatment of diabetes and the relationship between the three main macronutrients and blood glucose level;Carbohydrate counting    Physical activity and exercise  Role of exercise on diabetes management, blood pressure control and cardiac health.    Medications Reviewed patients medication for diabetes, action, purpose, timing of dose and side effects.    Monitoring Purpose and frequency of SMBG.    Chronic complications Relationship between chronic complications and blood glucose control;Nephropathy, what it is, prevention of, the use of ACE, ARB's and early detection of through urine microalbumia.;Retinopathy and reason for yearly dilated eye exams;Dental care    Psychosocial adjustment Worked with patient to identify barriers to care and solutions;Role of stress on diabetes;Identified and addressed patients feelings and concerns about diabetes    Personal strategies to promote health Lifestyle issues that need to be addressed for better diabetes care      Individualized Goals (developed by patient)   Nutrition  Follow meal plan discussed    Medications take my medication as prescribed    Monitoring  test my blood glucose as discussed      Post-Education Assessment   Patient understands the diabetes disease and treatment process. Needs Review    Patient understands incorporating nutritional management into lifestyle. Needs Review    Patient undertands incorporating physical activity into lifestyle. Needs Review    Patient understands using medications safely. Needs Review    Patient understands monitoring blood glucose, interpreting and using results Needs Review    Patient understands prevention, detection, and treatment of acute complications. Needs Review    Patient understands prevention, detection, and treatment of chronic complications. Needs Review    Patient understands how to develop strategies to address psychosocial issues. Needs Review  Patient understands how to develop strategies to promote health/change behavior. Needs Review      Outcomes   Expected Outcomes Demonstrated limited interest in learning.  Expect minimal changes    Future DMSE 4-6 wks    Program Status Not Completed           Individualized Plan for Diabetes Self-Management Training:   Learning Objective:  Patient will have a greater understanding of diabetes self-management. Patient education plan is to attend individual and/or group sessions per assessed needs and concerns.   Plan:   Patient Instructions  Have a boiled egg for a snack!  Work towards having less regular sodas and more zero sodas.  Try drinking more water every day.  Try to recognize carbs in the food choices you make every day.  Consider taking a Men's Daily multivitamin.   Expected Outcomes:  Demonstrated limited interest in learning.  Expect minimal changes  Education material provided: ADA - How to Thrive: A Guide for Your Journey with Diabetes  If problems or questions, patient to contact team via:  Phone and Email  Future  DSME appointment: 4-6 wks

## 2020-07-08 ENCOUNTER — Other Ambulatory Visit (HOSPITAL_COMMUNITY): Payer: Self-pay

## 2020-07-08 NOTE — Progress Notes (Signed)
Paramedicine Encounter    Patient ID: Calvin [name], male    DOB: August 27, 1960, 60 y.o.   MRN: [id number]   [name], NP as PCP - General (Geriatric Medicine) [name], MD as PCP - Cardiology (Cardiology) [name], [name], MD as PCP - Advanced Heart Failure (Cardiology) [name], MD as Consulting Physician (Endocrinology) Pleasant, Eppie Gibson, RN as [place] Management  Patient Active Problem List   Diagnosis Date Noted  . Intermittent claudication ([place]) 05/25/2020  . Hypoglycemia due to insulin 10/15/2019  . Chronic anticoagulation 10/15/2019  . COVID-19 virus detected 06/18/2019  . Right sided weakness 06/17/2019  . AKI (acute kidney injury) ([place]) 06/17/2019  . Foot pain, bilateral 02/01/2019  . Abdominal pain 01/23/2019  . Chest pain 01/23/2019  . Right leg pain 12/20/2018  . Overgrown toenails 12/20/2018  . Unsteady gait 12/20/2018  . Encounter for therapeutic drug monitoring 10/22/2018  . Nausea and vomiting 03/10/2018  . AF (paroxysmal atrial fibrillation) ([place]) 03/10/2018  . Non-cardiac chest pain 03/09/2018  . Diabetic neuropathy ([place]) 09/26/2017  . MCI (mild cognitive impairment) 06/23/2017  . OSA on CPAP 05/10/2017  . Gastroesophageal reflux disease 05/10/2017  . Insomnia 05/10/2017  . Onychomycosis of multiple toenails with type 2 diabetes mellitus and peripheral neuropathy ([place]) 05/10/2017  . Chronic systolic heart failure ([place]) 11/22/2015  . Essential hypertension 11/22/2015  . DM (diabetes mellitus) ([place]) 11/20/2015  . HLD (hyperlipidemia) 11/20/2015  . Hx of adenomatous colonic polyps 05/19/2011    Current Outpatient Medications:  .  albuterol (VENTOLIN HFA) 108 (90 Base) MCG/ACT inhaler, Inhale 2 puffs into the lungs every 6 (six) hours as needed for wheezing or shortness of breath., Disp: 6.7 g, Rfl: 0 .  amitriptyline (ELAVIL) 10 MG tablet, Take 2 tablets at bed time, Disp: 180  tablet, Rfl: 3 .  atorvastatin (LIPITOR) 80 MG tablet, Take 1 tablet (80 mg total) by mouth daily., Disp: 90 tablet, Rfl: 3 .  carvedilol (COREG) 6.25 MG tablet, Take 1 tablet (6.25 mg total) by mouth 2 (two) times daily with a meal., Disp: 180 tablet, Rfl: 3 .  dapagliflozin propanediol (FARXIGA) 10 MG TABS tablet, Take 1 tablet (10 mg total) by mouth daily before breakfast., Disp: 90 tablet, Rfl: 3 .  donepezil (ARICEPT) 10 MG tablet, Take 1 tablet (10 mg total) by mouth at bedtime., Disp: 90 tablet, Rfl: 3 .  ELIQUIS 5 MG TABS tablet, TAKE 1 TABLET BY MOUTH TWICE DAILY, Disp: 60 tablet, Rfl: 5 .  Evolocumab (REPATHA SURECLICK) 194 MG/ML SOAJ, Inject 1 pen into the skin every 14 (fourteen) days., Disp: 6 pen, Rfl: 3 .  ezetimibe (ZETIA) 10 MG tablet, Take 1 tablet (10 mg total) by mouth daily., Disp: 90 tablet, Rfl: 3 .  insulin degludec (TRESIBA FLEXTOUCH) 200 UNIT/ML FlexTouch Pen, Inject 46 Units into the skin daily., Disp: 18 mL, Rfl: 3 .  Insulin Syringe-Needle U-100 (INSULIN SYRINGE .3CC/29GX1/2") 29G X 1/2" 0.3 ML MISC, Inject 1 Syringe 3 (three) times daily as directed. Check blood sugar three times daily. Dx: E11.9 (Patient taking differently: Inject 1 Syringe as directed See admin instructions. Check blood sugar three times daily. Dx: E11.9), Disp: 100 each, Rfl: 3 .  Lancets (ONETOUCH DELICA PLUS [id number]) MISC, USE TWICE DAILY, Disp: 200 each, Rfl: 3 .  lidocaine (LMX) 4 % cream, Apply 1 application topically 3 (three) times daily as needed., Disp: 30 g, Rfl: 0 .  lisinopril (ZESTRIL) 20 MG tablet, Take 1  tablet (20 mg total) by mouth at bedtime., Disp: 30 tablet, Rfl: 6 .  ONETOUCH ULTRA test strip, USE TWICE DAILY, Disp: 200 strip, Rfl: 3 .  polyethylene glycol (MIRALAX / GLYCOLAX) 17 g packet, Take 17 g by mouth 2 (two) times daily., Disp: 14 each, Rfl: 0 .  senna (SENOKOT) 8.6 MG TABS tablet, Take 1 tablet (8.6 mg total) by mouth daily as needed for mild constipation., Disp: 15  tablet, Rfl: 0 .  spironolactone (ALDACTONE) 25 MG tablet, TAKE 1 TABLET BY MOUTH  DAILY, Disp: 90 tablet, Rfl: 3 .  triamcinolone (KENALOG) 0.1 %, Apply 1 application topically 3 (three) times daily as needed. For itching, Disp: 45 g, Rfl: 1 .  nitroGLYCERIN (NITROSTAT) 0.4 MG SL tablet, Place 1 tablet (0.4 mg total) under the tongue every 5 (five) minutes as needed for chest pain. (Patient not taking: Reported on 07/08/2020), Disp: 30 tablet, Rfl: 1 .  predniSONE (DELTASONE) 20 MG tablet, Take 1 tablet (20 mg total) by mouth 2 (two) times daily. (Patient not taking: Reported on 07/08/2020), Disp: 10 tablet, Rfl: 0 No Known Allergies    Social History   Socioeconomic History  . Marital status: Single    Spouse name: Not on file  . Number of children: 0  . Years of education: Not on file  . Highest education level: Not on file  Occupational History  . Occupation: Disability  Tobacco Use  . Smoking status: Never Smoker  . Smokeless tobacco: Never Used  Vaping Use  . Vaping Use: Never used  Substance and Sexual Activity  . Alcohol use: No  . Drug use: No  . Sexual activity: Yes    Birth control/protection: None  Other Topics Concern  . Not on file  Social History Narrative   Social History   Diet?    Do you drink/eat things with caffeine? yes   Marital status?       single      Do you live in a house, apartment, assisted living, condo, trailer, etc.? yes   Is it one or more stories? One story   How many persons live in your home?   Do you have any pets in your home? (please list)    Highest level of education completed? graduate   Do you exercise?            no                          Type & how often?   Advanced Directives   Do you have a living will? no   Do you have a DNR form?                                  If not, do you want to discuss one? no   Do you have signed POA/HPOA for forms? no      Functional Status   Do you have difficulty bathing or dressing yourself? no    Do you have difficulty preparing food or eating? no   Do you have difficulty managing your medications? no   Do you have difficulty managing your finances? no   Do you have difficulty affording your medications? no   Social Determinants of Health   Financial Resource Strain: Not on file  Food Insecurity: No Food Insecurity  . Worried About Charity fundraiser in the  Last Year: Never true  . Ran Out of Food in the Last Year: Never true  Transportation Needs: Unmet Transportation Needs  . Lack of Transportation (Medical): Yes  . Lack of Transportation (Non-Medical): Yes  Physical Activity: Not on file  Stress: Not on file  Social Connections: Not on file  Intimate Partner Violence: Not on file    Physical Exam      Future Appointments  Date Time Provider [place]  07/13/2020 10:45 AM Pleasant, Eppie Gibson, RN [facility] None  07/14/2020  9:30 AM [name], MD [facility] None  07/28/2020 11:00 AM Fredia Sorrow, RD [place] NDM  08/11/2020  1:00 PM [name], MD [facility] None  09/21/2020 11:30 AM [name], NP [facility] None    BP 128/72   Pulse 78   Temp 98.1 F (36.7 C)   Resp 18   Wt 166 lb (75.3 kg)   SpO2 99%   BMI 30.36 kg/m   Weight yesterday-? Last visit weight-178  Pt reports that he is doing ok.  He seen nutritionist last week. They thought he was having stroke symptoms and wanted him to be seen at ER but there is no record in his chart of that. He said he was there all day and they didn't find anything wrong with him. So not sure if they started a new chart for him or mixed him up with someone else but in his record there is nothing.  He said his right leg gave out on him and it does that sometimes but that day he fell he did not have his walking stick with him.  I gave him more scales-with all his moves he doesn't have anymore.  He sates his breathing is fine. He gets intermittent c/p. Usually they come at night time when he gets a very  hot feeling.  He denies dizziness.  meds verified--filled pill boxes up to 1 month. He needs to get atorvastatin refilled-he is short half a pill box for that. He will get it and place where needed.  Ordered atorvastatin and eliquis.    Marylouise Stacks, [facility] Paramedic  07/08/20

## 2020-07-09 NOTE — Progress Notes (Shared)
[facility] Note  07/14/2020     CHIEF COMPLAINT Patient presents for No chief complaint on file.   HISTORY OF PRESENT ILLNESS: Calvin [name] is a 59 y.o. male who presents to the clinic today for:     Referring physician: [name], NP [place],  [place] [address]  HISTORICAL INFORMATION:   Selected notes from the MEDICAL RECORD NUMBER Referred by Dr. [name] Uncontrolled DM II on insulin A1C: 10.1   CURRENT MEDICATIONS: No current outpatient medications on file. (Ophthalmic Drugs)   No current facility-administered medications for this visit. (Ophthalmic Drugs)   Current Outpatient Medications (Other)  Medication Sig  . albuterol (VENTOLIN HFA) 108 (90 Base) MCG/ACT inhaler Inhale 2 puffs into the lungs every 6 (six) hours as needed for wheezing or shortness of breath.  [name] amitriptyline (ELAVIL) 10 MG tablet Take 2 tablets at bed time  . atorvastatin (LIPITOR) 80 MG tablet Take 1 tablet (80 mg total) by mouth daily.  . carvedilol (COREG) 6.25 MG tablet Take 1 tablet (6.25 mg total) by mouth 2 (two) times daily with a meal.  . dapagliflozin propanediol (FARXIGA) 10 MG TABS tablet Take 1 tablet (10 mg total) by mouth daily before breakfast.  . donepezil (ARICEPT) 10 MG tablet Take 1 tablet (10 mg total) by mouth at bedtime.  [name] ELIQUIS 5 MG TABS tablet TAKE 1 TABLET BY MOUTH TWICE DAILY  . Evolocumab (REPATHA SURECLICK) 035 MG/ML SOAJ Inject 1 pen into the skin every 14 (fourteen) days.  [name] ezetimibe (ZETIA) 10 MG tablet Take 1 tablet (10 mg total) by mouth daily.  . insulin degludec (TRESIBA FLEXTOUCH) 200 UNIT/ML FlexTouch Pen Inject 46 Units into the skin daily.  . Insulin Syringe-Needle U-100 (INSULIN SYRINGE .3CC/29GX1/2") 29G X 1/2" 0.3 ML MISC Inject 1 Syringe 3 (three) times daily as directed. Check blood sugar three times daily. Dx: E11.9 (Patient taking differently: Inject 1 Syringe as directed See admin  instructions. Check blood sugar three times daily. Dx: E11.9)  . Lancets (ONETOUCH DELICA PLUS [id number]) MISC USE TWICE DAILY  . lidocaine (LMX) 4 % cream Apply 1 application topically 3 (three) times daily as needed.  [name] lisinopril (ZESTRIL) 20 MG tablet Take 1 tablet (20 mg total) by mouth at bedtime.  . nitroGLYCERIN (NITROSTAT) 0.4 MG SL tablet Place 1 tablet (0.4 mg total) under the tongue every 5 (five) minutes as needed for chest pain. (Patient not taking: Reported on 07/08/2020)  . ONETOUCH ULTRA test strip USE TWICE DAILY  . polyethylene glycol (MIRALAX / GLYCOLAX) 17 g packet Take 17 g by mouth 2 (two) times daily.  . predniSONE (DELTASONE) 20 MG tablet Take 1 tablet (20 mg total) by mouth 2 (two) times daily. (Patient not taking: Reported on 07/08/2020)  . senna (SENOKOT) 8.6 MG TABS tablet Take 1 tablet (8.6 mg total) by mouth daily as needed for mild constipation.  [name] spironolactone (ALDACTONE) 25 MG tablet TAKE 1 TABLET BY MOUTH  DAILY  . triamcinolone (KENALOG) 0.1 % Apply 1 application topically 3 (three) times daily as needed. For itching   No current facility-administered medications for this visit. (Other)      REVIEW OF SYSTEMS:    ALLERGIES No Known Allergies  PAST MEDICAL HISTORY Past Medical History:  Diagnosis Date  . Adenoidal hypertrophy   . Adenomatous colon polyps 2012  . Arthritis   . Diabetes mellitus   . GERD (gastroesophageal reflux disease)   . Heart attack (  [place])    [place] living in [place]  [name] Hx of adenomatous colonic polyps 08/18/2017  . Hyperlipidemia   . Hypertension   . Mild CAD    a. cath in 08/2017 showing mild nonobstructive CAD with scattered 20-30% stenosis.   . Nonischemic cardiomyopathy ([place])    a. EF 20-25% by echo in 09/2017 with cath showing mild CAD. b.  Last echo 12/2017 EF 35-40%, grade 2 DD.  [name] Obesity   . PAF (paroxysmal atrial fibrillation) ([place])   . Sleep apnea    cpap, pt says no longer has   Past Surgical History:  Procedure  Laterality Date  . COLONOSCOPY  05/13/11   9 adenomas  . FOREARM SURGERY    . MUSCLE BIOPSY    . RIGHT/LEFT HEART CATH AND CORONARY ANGIOGRAPHY N/A 08/25/2017   Procedure: RIGHT/LEFT HEART CATH AND CORONARY ANGIOGRAPHY;  Surgeon: [name], MD;  Location: [facility];  Service: Cardiovascular;  Laterality: N/A;  . RIGHT/LEFT HEART CATH AND CORONARY ANGIOGRAPHY N/A 10/01/2019   Procedure: RIGHT/LEFT HEART CATH AND CORONARY ANGIOGRAPHY;  Surgeon: [name], MD;  Location: [facility];  Service: Cardiovascular;  Laterality: N/A;    FAMILY HISTORY Family History  Problem Relation Age of Onset  . Heart disease Mother   . Heart attack Mother 50  . Hypertension Mother   . Hyperlipidemia Mother   . Diabetes Mother   . Heart disease Father   . Heart attack Father 43  . Hypertension Father   . Hyperlipidemia Father   . Diabetes Father   . Heart attack Sister 35  . Colon cancer Neg Hx   . Stomach cancer Neg Hx   . Esophageal cancer Neg Hx   . Rectal cancer Neg Hx     SOCIAL HISTORY Social History   Tobacco Use  . Smoking status: Never Smoker  . Smokeless tobacco: Never Used  Vaping Use  . Vaping Use: Never used  Substance Use Topics  . Alcohol use: No  . Drug use: No         OPHTHALMIC EXAM:  Not recorded     IMAGING AND PROCEDURES  Imaging and Procedures for 07/14/2020           ASSESSMENT/PLAN:  No diagnosis found.  1,2. Severe Non-proliferative diabetic retinopathy w/ DME, OU  - A1c 9.3 on 10.20.21 (down from 9.9)  - s/p IVA OS #1 (09.16.21), IVA OD #1 (09.21.21)             - s/p IVA OU #2 (10.19.21), #3 (11.16.21), #4 (12.14.21) - exam shows central edema OU, scattered IRH and severe exudates OU -- improving - FA (09.16.21) shows late leaking MA greatest posterior pole; no NV; +peripheral vascular perfusion defects -- may benefit from peripheral PRP OU - OCT shows interval improvement in DME OU -- improved IRF and  foveal contour OU - IVA OU #5 today, 1.11.22  - [place] informed consent obtained, signed and scanned OD, 09.21.21 - [place]in informed consent obtained, signed and scanned, 09.16.21 (OS) - f/u 4 weeks, DFE, OCT, possible injection(s)  3,4. Hypertensive retinopathy OU - discussed importance of tight BP control - monitor  5. Mixed Cataract OU - The symptoms of cataract, surgical options, and treatments and risks were discussed with patient. - discussed diagnosis and progression - visually significant -- will refer for cat eval once DR more stable  Ophthalmic Meds Ordered this visit:  No orders of the defined types were placed in this  encounter.     No follow-ups on file.  There are no Patient Instructions on file for this visit.   Explained the diagnoses, plan, and follow up with the patient and they expressed understanding.  Patient expressed understanding of the importance of proper follow up care.     Abbreviations: M myopia (nearsighted); A astigmatism; H hyperopia (farsighted); P presbyopia; Mrx spectacle prescription;  CTL contact lenses; OD right eye; OS left eye; OU both eyes  XT exotropia; ET esotropia; PEK punctate epithelial keratitis; PEE punctate epithelial erosions; DES dry eye syndrome; MGD meibomian gland dysfunction; ATs artificial tears; PFAT's preservative free artificial tears; [place] nuclear sclerotic cataract; PSC posterior subcapsular cataract; ERM epi-retinal membrane; PVD posterior vitreous detachment; RD retinal detachment; DM diabetes mellitus; DR diabetic retinopathy; NPDR non-proliferative diabetic retinopathy; PDR proliferative diabetic retinopathy; CSME clinically significant macular edema; DME diabetic macular edema; dbh dot blot hemorrhages; CWS cotton wool spot; POAG primary open angle glaucoma; C/D cup-to-disc ratio; HVF humphrey visual field; GVF goldmann visual field; OCT optical coherence tomography; IOP intraocular pressure; BRVO Branch retinal vein  occlusion; CRVO central retinal vein occlusion; CRAO central retinal artery occlusion; BRAO branch retinal artery occlusion; RT retinal tear; SB scleral buckle; PPV pars plana vitrectomy; VH Vitreous hemorrhage; PRP panretinal laser photocoagulation; IVK intravitreal kenalog; VMT vitreomacular traction; MH Macular hole;  NVD neovascularization of the disc; NVE neovascularization elsewhere; AREDS age related eye disease study; ARMD age related macular degeneration; POAG primary open angle glaucoma; EBMD epithelial/anterior basement membrane dystrophy; ACIOL anterior chamber intraocular lens; IOL intraocular lens; PCIOL posterior chamber intraocular lens; Phaco/IOL phacoemulsification with intraocular lens placement; [place] photorefractive keratectomy; LASIK laser assisted in situ keratomileusis; HTN hypertension; DM diabetes mellitus; COPD chronic obstructive pulmonary disease

## 2020-07-13 ENCOUNTER — Other Ambulatory Visit: Payer: Self-pay | Admitting: *Deleted

## 2020-07-13 NOTE — Patient Outreach (Addendum)
[facility]) Care Management  [place]  07/13/2020   Calvin [name] 05-Sep-1960 [id number]   Outreach attempt: RN left [employer] compliance voice mailmessage for call back: [place] received immediate return telephone call back from patient.  Hipaa compliance verified. Per patient his blood sugar was 320 this am. He stated his blood sugars usually run between 200-300. He checks his blood sugars AM and HS. Patient A1C is currently 8.9 down from 9.3. Per patient he stated he eats very little vegetables. He mostly eats meats. He states he doesn't eat a lot of potatoes, pasta or breads. Per patient he does eat chips, donuts, honey buns, candy bars and drink @ 3 sodas a day. He does not drink much water. RN discussed dietary changes. Patient is also now seeing a dietician. Patient is being follow by Marylouise Stacks EMT for CHF. He is weighing daily. She fills his medication bins monthly. Per patient he is exercising walking daily 2-3 miles a day. Patient has had 2 COVID vaccines and flu shot. Patient has diabetic retinopathy with macular swelling receiving eye injections. Patient has changed insurance to [place].   Encounter Medications:  Outpatient Encounter Medications as of 07/13/2020  Medication Sig  . albuterol (VENTOLIN HFA) 108 (90 Base) MCG/ACT inhaler Inhale 2 puffs into the lungs every 6 (six) hours as needed for wheezing or shortness of breath.  [name] amitriptyline (ELAVIL) 10 MG tablet Take 2 tablets at bed time  . atorvastatin (LIPITOR) 80 MG tablet Take 1 tablet (80 mg total) by mouth daily.  . carvedilol (COREG) 6.25 MG tablet Take 1 tablet (6.25 mg total) by mouth 2 (two) times daily with a meal.  . dapagliflozin propanediol (FARXIGA) 10 MG TABS tablet Take 1 tablet (10 mg total) by mouth daily before breakfast.  . donepezil (ARICEPT) 10 MG tablet Take 1 tablet (10 mg total) by mouth at bedtime.  [name] ELIQUIS 5 MG TABS tablet TAKE 1 TABLET BY MOUTH TWICE DAILY   . Evolocumab (REPATHA SURECLICK) 638 MG/ML SOAJ Inject 1 pen into the skin every 14 (fourteen) days.  [name] ezetimibe (ZETIA) 10 MG tablet Take 1 tablet (10 mg total) by mouth daily.  . insulin degludec (TRESIBA FLEXTOUCH) 200 UNIT/ML FlexTouch Pen Inject 46 Units into the skin daily.  . Insulin Syringe-Needle U-100 (INSULIN SYRINGE .3CC/29GX1/2") 29G X 1/2" 0.3 ML MISC Inject 1 Syringe 3 (three) times daily as directed. Check blood sugar three times daily. Dx: E11.9 (Patient taking differently: Inject 1 Syringe as directed See admin instructions. Check blood sugar three times daily. Dx: E11.9)  . Lancets (ONETOUCH DELICA PLUS [id number]) MISC USE TWICE DAILY  . lidocaine (LMX) 4 % cream Apply 1 application topically 3 (three) times daily as needed.  [name] lisinopril (ZESTRIL) 20 MG tablet Take 1 tablet (20 mg total) by mouth at bedtime.  . nitroGLYCERIN (NITROSTAT) 0.4 MG SL tablet Place 1 tablet (0.4 mg total) under the tongue every 5 (five) minutes as needed for chest pain. (Patient not taking: Reported on 07/08/2020)  . ONETOUCH ULTRA test strip USE TWICE DAILY  . polyethylene glycol (MIRALAX / GLYCOLAX) 17 g packet Take 17 g by mouth 2 (two) times daily.  . predniSONE (DELTASONE) 20 MG tablet Take 1 tablet (20 mg total) by mouth 2 (two) times daily. (Patient not taking: Reported on 07/08/2020)  . senna (SENOKOT) 8.6 MG TABS tablet Take 1 tablet (8.6 mg total) by mouth daily as needed for mild constipation.  [name] spironolactone (ALDACTONE) 25 MG  tablet TAKE 1 TABLET BY MOUTH  DAILY  . triamcinolone (KENALOG) 0.1 % Apply 1 application topically 3 (three) times daily as needed. For itching   No facility-administered encounter medications on file as of 07/13/2020.    Functional Status:  In your present state of health, do you have any difficulty performing the following activities: 10/15/2019  Hearing? N  Vision? N  Difficulty concentrating or making decisions? N  Walking or climbing stairs? N  Dressing or  bathing? N  Doing errands, shopping? N  Some recent data might be hidden    Fall/Depression Screening: Fall Risk  07/13/2020 07/01/2020 05/25/2020  Falls in the past year? 0 0 0  Number falls in past yr: 0 0 0  Injury with Fall? - - 0  Risk for fall due to : - - -  Follow up - - -   PHQ 2/9 Scores 07/01/2020 05/25/2020 07/15/2019 05/01/2019 04/26/2018 06/20/2017  PHQ - 2 Score 1 0 0 0 0 0    Assessment:  Goals Addressed   None     Plan:  RN will refer to Human Care Coordinators Patient has eye exam scheduled for 07/14/2020 Patient has nutritionist scheduled for 07/28/2020  [place] Care Management [phone]

## 2020-07-14 ENCOUNTER — Other Ambulatory Visit: Payer: Self-pay

## 2020-07-14 ENCOUNTER — Encounter (INDEPENDENT_AMBULATORY_CARE_PROVIDER_SITE_OTHER): Payer: Medicare HMO | Admitting: Ophthalmology

## 2020-07-14 ENCOUNTER — Encounter (INDEPENDENT_AMBULATORY_CARE_PROVIDER_SITE_OTHER): Payer: Self-pay

## 2020-07-14 DIAGNOSIS — E113413 Type 2 diabetes mellitus with severe nonproliferative diabetic retinopathy with macular edema, bilateral: Secondary | ICD-10-CM

## 2020-07-14 DIAGNOSIS — I10 Essential (primary) hypertension: Secondary | ICD-10-CM

## 2020-07-14 DIAGNOSIS — H3581 Retinal edema: Secondary | ICD-10-CM

## 2020-07-14 DIAGNOSIS — H25813 Combined forms of age-related cataract, bilateral: Secondary | ICD-10-CM

## 2020-07-14 DIAGNOSIS — H35033 Hypertensive retinopathy, bilateral: Secondary | ICD-10-CM

## 2020-07-22 ENCOUNTER — Encounter (INDEPENDENT_AMBULATORY_CARE_PROVIDER_SITE_OTHER): Payer: Medicare HMO | Admitting: Ophthalmology

## 2020-07-23 ENCOUNTER — Other Ambulatory Visit: Payer: Self-pay

## 2020-07-23 ENCOUNTER — Telehealth: Payer: Self-pay

## 2020-07-23 ENCOUNTER — Encounter: Payer: Self-pay | Admitting: Nurse Practitioner

## 2020-07-23 ENCOUNTER — Ambulatory Visit (INDEPENDENT_AMBULATORY_CARE_PROVIDER_SITE_OTHER): Payer: Medicare HMO | Admitting: Nurse Practitioner

## 2020-07-23 DIAGNOSIS — Z0000 Encounter for general adult medical examination without abnormal findings: Secondary | ICD-10-CM | POA: Diagnosis not present

## 2020-07-23 NOTE — Patient Outreach (Signed)
[place] [facility]) Care Management  07/23/2020  Calvin [name] 07/20/1960 [id number]   [place] call to patient. He states that he is trying to get back to [facility] but understands he has [employer] now.  Advised patient to call [employer] to find out about switching back. He verbalized understanding.  Introduced myself as his new nurse now that he has [employer] and that CM would follow up next month with him.  He verbalized understanding.   Plan: RN CM will send letter to patient and follow up next month. Patient agrees to follow-up.   Jone Baseman, RN, MSN [place] [facility] Care Management Coordinator Direct Line [phone] Cell [phone] Toll Free: [phone]  Fax: [phone]

## 2020-07-23 NOTE — Progress Notes (Signed)
Subjective:   Calvin [name] is a 60 y.o. male who presents for Medicare Annual/Subsequent preventive examination.  Review of Systems     Cardiac Risk Factors include: advanced age (>40mn, >>63women);obesity (BMI >30kg/m2);family history of premature cardiovascular disease;hypertension;male gender;dyslipidemia;diabetes mellitus     Objective:    Today's Vitals   07/23/20 0936  PainSc: 7    There is no height or weight on file to calculate BMI.  Advanced Directives 07/23/2020 07/13/2020 07/01/2020 06/10/2020 05/25/2020 01/20/2020 01/20/2020  Does Patient Have a Medical Advance Directive? No No No No No No No  Type of Advance Directive - - - - - - -  Does patient want to make changes to medical advance directive? - - - - - No - Patient declined Yes (MAU/Ambulatory/Procedural Areas - Information given)  [place]? - - - - - - -  Would patient like information on creating a medical advance directive? No - Patient declined No - Patient declined Yes (MAU/Ambulatory/Procedural Areas - Information given) Yes (MAU/Ambulatory/Procedural Areas - Information given) Yes (MAU/Ambulatory/Procedural Areas - Information given) No - Guardian declined -    Current Medications (verified) Outpatient Encounter Medications as of 07/23/2020  Medication Sig  . albuterol (VENTOLIN HFA) 108 (90 Base) MCG/ACT inhaler Inhale 2 puffs into the lungs every 6 (six) hours as needed for wheezing or shortness of breath.  .Marland Kitchenamitriptyline (ELAVIL) 10 MG tablet Take 2 tablets at bed time  . atorvastatin (LIPITOR) 80 MG tablet Take 1 tablet (80 mg total) by mouth daily.  . carvedilol (COREG) 6.25 MG tablet Take 1 tablet (6.25 mg total) by mouth 2 (two) times daily with a meal.  . dapagliflozin propanediol (FARXIGA) 10 MG TABS tablet Take 1 tablet (10 mg total) by mouth daily before breakfast.  . donepezil (ARICEPT) 10 MG tablet Take 1 tablet (10 mg total) by mouth at bedtime.  .[name] ELIQUIS 5 MG TABS tablet TAKE 1 TABLET BY MOUTH TWICE DAILY  . Evolocumab (REPATHA SURECLICK) 1638MG/ML SOAJ Inject 1 pen into the skin every 14 (fourteen) days.  .Marland Kitchenezetimibe (ZETIA) 10 MG tablet Take 1 tablet (10 mg total) by mouth daily.  . insulin degludec (TRESIBA FLEXTOUCH) 200 UNIT/ML FlexTouch Pen Inject 46 Units into the skin daily.  . Insulin Syringe-Needle U-100 (INSULIN SYRINGE .3CC/29GX1/2") 29G X 1/2" 0.3 ML MISC Inject 1 Syringe 3 (three) times daily as directed. Check blood sugar three times daily. Dx: E11.9 (Patient taking differently: Inject 1 Syringe as directed See admin instructions. Check blood sugar three times daily. Dx: E11.9)  . Lancets (ONETOUCH DELICA PLUS [id number] MISC USE TWICE DAILY  . lidocaine (LMX) 4 % cream Apply 1 application topically 3 (three) times daily as needed.  .Marland Kitchenlisinopril (ZESTRIL) 20 MG tablet Take 1 tablet (20 mg total) by mouth at bedtime.  . nitroGLYCERIN (NITROSTAT) 0.4 MG SL tablet Place 1 tablet (0.4 mg total) under the tongue every 5 (five) minutes as needed for chest pain.  .Glory RosebushULTRA test strip USE TWICE DAILY  . polyethylene glycol (MIRALAX / GLYCOLAX) 17 g packet Take 17 g by mouth 2 (two) times daily.  . predniSONE (DELTASONE) 20 MG tablet Take 1 tablet (20 mg total) by mouth 2 (two) times daily.  .Marland Kitchenspironolactone (ALDACTONE) 25 MG tablet TAKE 1 TABLET BY MOUTH  DAILY  . triamcinolone (KENALOG) 0.1 % Apply 1 application topically 3 (three) times daily as needed. For itching  . [DISCONTINUED] senna (SENOKOT) 8.6 MG  TABS tablet Take 1 tablet (8.6 mg total) by mouth daily as needed for mild constipation.   No facility-administered encounter medications on file as of 07/23/2020.    Allergies (verified) Patient has no known allergies.   History: Past Medical History:  Diagnosis Date  . Adenoidal hypertrophy   . Adenomatous colon polyps 2012  . Arthritis   . Diabetes mellitus   . GERD (gastroesophageal reflux disease)   . Heart  attack ([place])    [place] living in [place]  [name] Hx of adenomatous colonic polyps 08/18/2017  . Hyperlipidemia   . Hypertension   . Mild CAD    a. cath in 08/2017 showing mild nonobstructive CAD with scattered 20-30% stenosis.   . Nonischemic cardiomyopathy ([place])    a. EF 20-25% by echo in 09/2017 with cath showing mild CAD. b.  Last echo 12/2017 EF 35-40%, grade 2 DD.  [name] Obesity   . PAF (paroxysmal atrial fibrillation) ([place])   . Sleep apnea    cpap, pt says no longer has   Past Surgical History:  Procedure Laterality Date  . COLONOSCOPY  05/13/11   9 adenomas  . FOREARM SURGERY    . MUSCLE BIOPSY    . RIGHT/LEFT HEART CATH AND CORONARY ANGIOGRAPHY N/A 08/25/2017   Procedure: RIGHT/LEFT HEART CATH AND CORONARY ANGIOGRAPHY;  Surgeon: [name], MD;  Location: [facility];  Service: Cardiovascular;  Laterality: N/A;  . RIGHT/LEFT HEART CATH AND CORONARY ANGIOGRAPHY N/A 10/01/2019   Procedure: RIGHT/LEFT HEART CATH AND CORONARY ANGIOGRAPHY;  Surgeon: [name], MD;  Location: [facility];  Service: Cardiovascular;  Laterality: N/A;   Family History  Problem Relation Age of Onset  . Heart disease Mother   . Heart attack Mother 71  . Hypertension Mother   . Hyperlipidemia Mother   . Diabetes Mother   . Heart disease Father   . Heart attack Father 73  . Hypertension Father   . Hyperlipidemia Father   . Diabetes Father   . Heart attack Sister 39  . Colon cancer Neg Hx   . Stomach cancer Neg Hx   . Esophageal cancer Neg Hx   . Rectal cancer Neg Hx    Social History   Socioeconomic History  . Marital status: Single    Spouse name: Not on file  . Number of children: 0  . Years of education: Not on file  . Highest education level: Not on file  Occupational History  . Occupation: Disability  Tobacco Use  . Smoking status: Never Smoker  . Smokeless tobacco: Never Used  Vaping Use  . Vaping Use: Never used  Substance and Sexual Activity  . Alcohol  use: No  . Drug use: No  . Sexual activity: Yes    Birth control/protection: None  Other Topics Concern  . Not on file  Social History Narrative   Social History   Diet?    Do you drink/eat things with caffeine? yes   Marital status?       single      Do you live in a house, apartment, assisted living, condo, trailer, etc.? yes   Is it one or more stories? One story   How many persons live in your home?   Do you have any pets in your home? (please list)    Highest level of education completed? graduate   Do you exercise?            no  Type & how often?   Advanced Directives   Do you have a living will? no   Do you have a DNR form?                                  If not, do you want to discuss one? no   Do you have signed POA/HPOA for forms? no      Functional Status   Do you have difficulty bathing or dressing yourself? no   Do you have difficulty preparing food or eating? no   Do you have difficulty managing your medications? no   Do you have difficulty managing your finances? no   Do you have difficulty affording your medications? no   Social Determinants of Health   Financial Resource Strain: Not on file  Food Insecurity: Not on file  Transportation Needs: Not on file  Physical Activity: Not on file  Stress: Not on file  Social Connections: Not on file    Tobacco Counseling Counseling given: Not Answered   Clinical Intake:  Pre-visit preparation completed: Yes  Pain : 0-10 Pain Score: 7  Pain Type: Chronic pain Pain Location: Chest Pain Radiating Towards: left or right side Pain Descriptors / Indicators: Other (Comment) (pulling) Pain Onset: More than a month ago Pain Frequency: Intermittent Pain Relieving Factors: nothing Effect of Pain on Daily Activities: decreases activity  Pain Relieving Factors: nothing  BMI - recorded: 30 Nutritional Status: BMI > 30  Obese Nutritional Risks: None Diabetes: No  How often do you need  to have someone help you when you read instructions, pamphlets, or other written materials from your doctor or pharmacy?: 5 - Always  Diabetic?yes         Activities of Daily Living In your present state of health, do you have any difficulty performing the following activities: 07/23/2020 10/15/2019  Hearing? - N  Vision? Y N  Difficulty concentrating or making decisions? Y N  Walking or climbing stairs? Y N  Dressing or bathing? N N  Doing errands, shopping? Y N  Comment does not drive often Facilities manager and eating ? N -  Using the Toilet? N -  In the past six months, have you accidently leaked urine? Y -  Do you have problems with loss of bowel control? N -  Managing your Medications? Y -  Managing your Finances? Y -  Housekeeping or managing your Housekeeping? Y -  Some recent data might be hidden    [name], NP as PCP - General (Geriatric Medicine) [name], MD as PCP - Cardiology (Cardiology) [name], [name], MD as PCP - Advanced Heart Failure (Cardiology) [name], MD as Consulting Physician (Endocrinology) Pleasant, Eppie Gibson, RN as [place] Management Jon Billings, RN as [place] any recent [place] you may have received from other than [facility] providers in the past year (date may be approximate).     Assessment:   This is a routine wellness examination for Calvin [name].  Hearing/Vision screen  Hearing Screening   125Hz  250Hz  500Hz  1000Hz  2000Hz  3000Hz  4000Hz  6000Hz  8000Hz   Right ear:           Left ear:           Comments: Patient has no hearing problems.  Vision Screening Comments: Patient has problems with vision. Patient can't go to doctor  because of insurance. Patient wears glasses  Dietary issues and exercise activities discussed: Current Exercise Habits: Home exercise routine, Time (Minutes): 60, Frequency (Times/Week): 7, Weekly  Exercise (Minutes/Week): 420  Goals    .  Get my blood sugars better (pt-stated)      CARE PLAN ENTRY (see longtitudinal plan of care for additional care plan information)  Objective:  Lab Results  Component Value Date   HGBA1C 10.1 (A) 01/15/2020 .   Lab Results  Component Value Date   CREATININE 1.26 (H) 01/20/2020   CREATININE 1.53 (H) 12/24/2019   CREATININE 1.24 10/25/2019 .   [name] No results found for: EGFR  Current Barriers:  [name] Knowledge Deficits related to basic Diabetes pathophysiology and self care/management . Knowledge Deficits related to medications used for management of diabetes  Case Manager Clinical Goal(s):  Over the next 60 days, patient will demonstrate improved adherence to prescribed treatment plan for diabetes self care/management as evidenced by: decreased A1C </= 8 . Verbalize daily monitoring and recording of CBG within 28 days . Verbalize adherence to ADA/ carb modified diet within the next 28 days . Verbalize adherence to prescribed medication regimen within the next 28 days   Interventions:  . Provided education to patient about basic DM disease process . Provided patient with written educational materials related to hypo and hyperglycemia and importance of correct treatment . Reviewed scheduled/upcoming provider appointments including: endocrinologist . Advised patient, providing education and rationale, to check cbg 3 times a day and record, calling MD for findings outside established parameters.    Patient Self Care Activities:  . Self administers injectable DM medication [name]) as prescribed . Attends all scheduled provider appointments . Checks blood sugars as prescribed and utilize hyper and hypoglycemia protocol as needed  Initial goal documentation     .  [facility] - Monitor and Manage My Blood Sugar      Follow Up Date 07/12/2020   - check blood sugar at prescribed times - check blood sugar before and after exercise - check blood sugar if  I feel it is too high or too low - take the blood sugar log to all doctor visits    Why is this important?   Checking your blood sugar at home helps to keep it from getting very high or very low.  Writing the results in a diary or log helps the doctor know how to care for you.  Your blood sugar log should have the time, date and the results.  Also, write down the amount of insulin or other medicine that you take.  Other information, like what you ate, exercise done and how you were feeling, will also be helpful.     Notes:   11/8 - Reminded to monitor multiple times a day with insulin changes  12/9 - Reviewed insulin changes as well as appointment with nutritionist    .  [facility] - Set My Target A1C      Follow Up Date 09/09/2020   - set target A1C - Less than 8   Why is this important?   Your target A1C is decided together by you and your doctor.  It is based on several things like your age and other health issues.    Notes:   11/8 - decreased to 9.3 on 04/22/20  12/9 - decreased to 8.9 in December 2021      Depression Screen PHQ 2/9 Scores 07/23/2020 07/01/2020 05/25/2020 07/15/2019 05/01/2019 04/26/2018 06/20/2017  PHQ - 2 Score 0 1  0 0 0 0 0    Fall Risk Fall Risk  07/23/2020 07/13/2020 07/01/2020 05/25/2020 01/20/2020  Falls in the past year? 1 0 0 0 0  Number falls in past yr: 1 0 0 0 0  Injury with Fall? 0 - - 0 0  Risk for fall due to : - - - - -  Follow up - - - - -    [place]:  Any stairs in or around the home? No  If so, are there any without handrails? No  Home free of loose throw rugs in walkways, pet beds, electrical cords, etc? Yes  Adequate lighting in your home to reduce risk of falls? Yes   ASSISTIVE DEVICES UTILIZED TO PREVENT FALLS:  Life alert? No  Use of a cane, walker or w/c? Yes  Grab bars in the bathroom? No  Shower chair or bench in shower? No  Elevated toilet seat or a handicapped toilet? No   TIMED UP AND  GO:  Was the test performed? No .  na  Cognitive Function: MMSE - Mini Mental State Exam 05/09/2018 06/20/2017  Orientation to time 5 5  Orientation to Place 5 5  Registration 3 3  Attention/ Calculation 4 0  Recall 2 1  Language- name 2 objects 2 2  Language- repeat 1 1  Language- follow 3 step command 3 2  Language- read & follow direction 1 1  Write a sentence 1 0  Copy design 1 1  Total score 28 21     6CIT Screen 07/23/2020  What Year? 0 points  What month? 0 points  What time? 0 points  Count back from 20 0 points  Months in reverse 4 points  Repeat phrase 8 points  Total Score 12    Immunizations Immunization History  Administered Date(s) Administered  . Fluad Quad(high Dose 65+) 05/25/2020  . Influenza,inj,Quad PF,6+ Mos 05/10/2017, 05/02/2019  . Influenza-Unspecified 04/02/2014  . PFIZER(Purple Top)SARS-COV-2 Vaccination 10/10/2019, 11/04/2019  . Pneumococcal Conjugate-13 06/20/2017  . Pneumococcal Polysaccharide-23 05/02/2019  . Tdap 09/28/2015    TDAP status: Up to date  Flu Vaccine status: Up to date  Pneumococcal vaccine status: Up to date  Covid-19 vaccine status: Information provided on how to obtain vaccines.   Qualifies for Shingles Vaccine? Yes   Zostavax completed No   Shingrix Completed?: No.    Education has been provided regarding the importance of this vaccine. Patient has been advised to call insurance company to determine out of pocket expense if they have not yet received this vaccine. Advised may also receive vaccine at local pharmacy or Health Dept. Verbalized acceptance and understanding.  Screening Tests Health Maintenance  Topic Date Due  . OPHTHALMOLOGY EXAM  Never done  . COVID-19 Vaccine (3 - Booster for Pfizer series) 05/06/2020  . COLONOSCOPY (Pts 45-67yr Insurance coverage will need to be confirmed)  08/10/2020  . HEMOGLOBIN A1C  12/08/2020  . FOOT EXAM  06/09/2021  . TETANUS/TDAP  09/27/2025  . INFLUENZA VACCINE   Completed  . PNEUMOCOCCAL POLYSACCHARIDE VACCINE AGE 71-64 HIGH RISK  Completed  . Hepatitis C Screening  Completed  . HIV Screening  Completed    Health Maintenance  Health Maintenance Due  Topic Date Due  . OPHTHALMOLOGY EXAM  Never done  . COVID-19 Vaccine (3 - Booster for Pfizer series) 05/06/2020    Colorectal cancer screening: Type of screening: Colonoscopy. Completed 2019. Repeat every 2 years  Lung Cancer Screening: (Low Dose  CT Chest recommended if Age 66-80 years, 30 pack-year currently smoking OR have quit w/in 15years.) does not qualify.   Lung Cancer Screening Referral: na  Additional Screening:  Hepatitis C Screening: does qualify; Completed 2020  Vision Screening: Recommended annual ophthalmology exams for early detection of glaucoma and other disorders of the eye. Is the patient up to date with their annual eye exam?  No  Who is the provider or what is the name of the office in which the patient attends annual eye exams? none If pt is not established with a provider, would they like to be referred to a provider to establish care? Yes .   Dental Screening: Recommended annual dental exams for proper oral hygiene  Community Resource Referral / Chronic Care Management: CRR required this visit?  No   CCM required this visit?  No      Plan:     I have personally reviewed and noted the following in the patient's chart:   . Medical and social history . Use of alcohol, tobacco or illicit drugs  . Current medications and supplements . Functional ability and status . Nutritional status . Physical activity . Advanced directives . List of other physicians . Hospitalizations, surgeries, and ER visits in previous 12 months . Vitals . Screenings to include cognitive, depression, and falls . Referrals and appointments  In addition, I have reviewed and discussed with patient certain preventive protocols, quality metrics, and best practice recommendations. A  written personalized care plan for preventive services as well as general preventive health recommendations were provided to patient.     [name], NP   07/23/2020    Virtual Visit via Telephone Note  I connected with@ on 07/23/20 at  9:30 AM EST by telephone and verified that I am speaking with the correct person using two identifiers.  Location: Patient: home  Provider: [facility]   I discussed the limitations, risks, security and privacy concerns of performing an evaluation and management service by telephone and the availability of in person appointments. I also discussed with the patient that there may be a patient responsible charge related to this service. The patient expressed understanding and agreed to proceed.   I discussed the assessment and treatment plan with the patient. The patient was provided an opportunity to ask questions and all were answered. The patient agreed with the plan and demonstrated an understanding of the instructions.   The patient was advised to call back or seek an in-person evaluation if the symptoms worsen or if the condition fails to improve as anticipated.  I provided 18 minutes of non-face-to-face time during this encounter.  Carlos American. Harle Battiest [employer] printed and mailed

## 2020-07-23 NOTE — Progress Notes (Signed)
This service is provided via telemedicine  No vital signs collected/recorded due to the encounter was a telemedicine visit.   Location of patient (ex: home, work): Home  Patient consents to a telephone visit: Yes, see encounter dated 07/23/2020  Location of the provider (ex: office, home):[employer]  Name of any referring provider: N/A  Names of all persons participating in the telemedicine service and their role in the encounter: Sherrie Mustache, Nurse Practitioner, Carroll Kinds, CMA, and patient.   Time spent on call:  Sherrie Mustache, Nurse Practitioner, Carroll Kinds, CMA, and patient.

## 2020-07-23 NOTE — Telephone Encounter (Signed)
Mr. Calvin [name], Calvin [name] are scheduled for a virtual visit with your provider today.    Just as we do with appointments in the office, we must obtain your consent to participate.  Your consent will be active for this visit and any virtual visit you may have with one of our providers in the next 365 days.    If you have a MyChart account, I can also send a copy of this consent to you electronically.  All virtual visits are billed to your insurance company just like a traditional visit in the office.  As this is a virtual visit, video technology does not allow for your provider to perform a traditional examination.  This may limit your provider's ability to fully assess your condition.  If your provider identifies any concerns that need to be evaluated in person or the need to arrange testing such as labs, EKG, etc, we will make arrangements to do so.    Although advances in technology are sophisticated, we cannot ensure that it will always work on either your end or our end.  If the connection with a video visit is poor, we may have to switch to a telephone visit.  With either a video or telephone visit, we are not always able to ensure that we have a secure connection.   I need to obtain your verbal consent now.   Are you willing to proceed with your visit today?   Calvin [name] has provided verbal consent on 07/23/2020 for a virtual visit (video or telephone).   Carroll Kinds, [facility] 07/23/2020  9:33 AM

## 2020-07-23 NOTE — Patient Instructions (Signed)
Calvin [name] , Thank you for taking time to come for your Medicare Wellness Visit. I appreciate your ongoing commitment to your health goals. Please review the following plan we discussed and let me know if I can assist you in the future.   Screening recommendations/referrals: Colonoscopy DUE COLONSCOPY THIS YEAR Recommended yearly ophthalmology/optometry visit for glaucoma screening and checkup Recommended yearly dental visit for hygiene and checkup  Vaccinations: Influenza vaccine up to date Pneumococcal vaccine - up to date Tdap vaccine up to date Shingles vaccine - RECOMMENDED to  Calvin [name] Colon  Advanced directives: recommended to complete and bring back to office to place on file.   Conditions/risks identified: complications related to uncontrolled blood sugar, progressive memory loss, advanced age, cardiovascular risk   Next appointment: yearly   Preventive Care 57-64 Years, Male Preventive care refers to lifestyle choices and visits with your health care provider that can promote health and wellness. What does preventive care include?  A yearly physical exam. This is also called an annual well check.  Dental exams once or twice a year.  Routine eye exams. Ask your health care provider how often you should have your eyes checked.  Personal lifestyle choices, including:  Daily care of your teeth and gums.  Regular physical activity.  Eating a healthy diet.  Avoiding tobacco and drug use.  Limiting alcohol use.  Practicing safe sex.  Taking low-dose aspirin every day starting at age 39. What happens during an annual well check? The services and screenings done by your health care provider during your annual well check will depend on your age, overall health, lifestyle risk factors, and family history of disease. Counseling  Your health care provider may ask you questions about your:  Alcohol use.  Tobacco use.  Drug use.  Emotional  well-being.  Home and relationship well-being.  Sexual activity.  Eating habits.  Work and work Statistician. Screening  You may have the following tests or measurements:  Height, weight, and BMI.  Blood pressure.  Lipid and cholesterol levels. These may be checked every 5 years, or more frequently if you are over 37 years old.  Skin check.  Lung cancer screening. You may have this screening every year starting at age 31 if you have a 30-pack-year history of smoking and currently smoke or have quit within the past 15 years.  Fecal occult blood test (FOBT) of the stool. You may have this test every year starting at age 73.  Flexible sigmoidoscopy or colonoscopy. You may have a sigmoidoscopy every 5 years or a colonoscopy every 10 years starting at age 56.  Prostate cancer screening. Recommendations will vary depending on your family history and other risks.  Hepatitis C blood test.  Hepatitis B blood test.  Sexually transmitted disease (STD) testing.  Diabetes screening. This is done by checking your blood sugar (glucose) after you have not eaten for a while (fasting). You may have this done every 1-3 years. Discuss your test results, treatment options, and if necessary, the need for more tests with your health care provider. Vaccines  Your health care provider may recommend certain vaccines, such as:  Influenza vaccine. This is recommended every year.  Tetanus, diphtheria, and acellular pertussis (Tdap, Td) vaccine. You may need a Td booster every 10 years.  Zoster vaccine. You may need this after age 104.  Pneumococcal 13-valent conjugate (PCV13) vaccine. You may need this if you have certain conditions and have not been vaccinated.  Pneumococcal polysaccharide (PPSV23) vaccine. You may  need one or two doses if you smoke cigarettes or if you have certain conditions. Talk to your health care provider about which screenings and vaccines you need and how often you need  them. This information is not intended to replace advice given to you by your health care provider. Make sure you discuss any questions you have with your health care provider. Document Released: 07/17/2015 Document Revised: 03/09/2016 Document Reviewed: 04/21/2015 Elsevier Interactive Patient Education  2017 [employer] Prevention in the Home Falls can cause injuries. They can happen to people of all ages. There are many things you can do to make your home safe and to help prevent falls. What can I do on the outside of my home?  Regularly fix the edges of walkways and driveways and fix any cracks.  Remove anything that might make you trip as you walk through a door, such as a raised step or threshold.  Trim any bushes or trees on the path to your home.  Use bright outdoor lighting.  Clear any walking paths of anything that might make someone trip, such as rocks or tools.  Regularly check to see if handrails are loose or broken. Make sure that both sides of any steps have handrails.  Any raised decks and porches should have guardrails on the edges.  Have any leaves, snow, or ice cleared regularly.  Use sand or salt on walking paths during winter.  Clean up any spills in your garage right away. This includes oil or grease spills. What can I do in the bathroom?  Use night lights.  Install grab bars by the toilet and in the tub and shower. Do not use towel bars as grab bars.  Use non-skid mats or decals in the tub or shower.  If you need to sit down in the shower, use a plastic, non-slip stool.  Keep the floor dry. Clean up any water that spills on the floor as soon as it happens.  Remove soap buildup in the tub or shower regularly.  Attach bath mats securely with double-sided non-slip rug tape.  Do not have throw rugs and other things on the floor that can make you trip. What can I do in the bedroom?  Use night lights.  Make sure that you have a light by your  bed that is easy to reach.  Do not use any sheets or blankets that are too big for your bed. They should not hang down onto the floor.  Have a firm chair that has side arms. You can use this for support while you get dressed.  Do not have throw rugs and other things on the floor that can make you trip. What can I do in the kitchen?  Clean up any spills right away.  Avoid walking on wet floors.  Keep items that you use a lot in easy-to-reach places.  If you need to reach something above you, use a strong step stool that has a grab bar.  Keep electrical cords out of the way.  Do not use floor polish or wax that makes floors slippery. If you must use wax, use non-skid floor wax.  Do not have throw rugs and other things on the floor that can make you trip. What can I do with my stairs?  Do not leave any items on the stairs.  Make sure that there are handrails on both sides of the stairs and use them. Fix handrails that are broken or loose. Make sure  that handrails are as long as the stairways.  Check any carpeting to make sure that it is firmly attached to the stairs. Fix any carpet that is loose or worn.  Avoid having throw rugs at the top or bottom of the stairs. If you do have throw rugs, attach them to the floor with carpet tape.  Make sure that you have a light switch at the top of the stairs and the bottom of the stairs. If you do not have them, ask someone to add them for you. What else can I do to help prevent falls?  Wear shoes that:  Do not have high heels.  Have rubber bottoms.  Are comfortable and fit you well.  Are closed at the toe. Do not wear sandals.  If you use a stepladder:  Make sure that it is fully opened. Do not climb a closed stepladder.  Make sure that both sides of the stepladder are locked into place.  Ask someone to hold it for you, if possible.  Clearly mark and make sure that you can see:  Any grab bars or handrails.  First and last  steps.  Where the edge of each step is.  Use tools that help you move around (mobility aids) if they are needed. These include:  Canes.  Walkers.  Scooters.  Crutches.  Turn on the lights when you go into a dark area. Replace any light bulbs as soon as they burn out.  Set up your furniture so you have a clear path. Avoid moving your furniture around.  If any of your floors are uneven, fix them.  If there are any pets around you, be aware of where they are.  Review your medicines with your doctor. Some medicines can make you feel dizzy. This can increase your chance of falling. Ask your doctor what other things that you can do to help prevent falls. This information is not intended to replace advice given to you by your health care provider. Make sure you discuss any questions you have with your health care provider. Document Released: 04/16/2009 Document Revised: 11/26/2015 Document Reviewed: 07/25/2014 Elsevier Interactive Patient Education  2017 [employer].

## 2020-07-28 ENCOUNTER — Ambulatory Visit: Payer: Medicare Other | Admitting: Dietician

## 2020-08-04 ENCOUNTER — Other Ambulatory Visit (HOSPITAL_COMMUNITY): Payer: Self-pay

## 2020-08-04 NOTE — Progress Notes (Signed)
Paramedicine Encounter    Patient ID: Calvin [name], male    DOB: 1961/02/12, 60 y.o.   MRN: [id number]   [name], NP as PCP - General (Geriatric Medicine) [name], MD as PCP - Cardiology (Cardiology) [name], [name], MD as PCP - Advanced Heart Failure (Cardiology) [name], MD as Consulting Physician (Endocrinology) Pleasant, Eppie Gibson, RN as [facility] Jon Billings, RN as [place] Management  Patient Active Problem List   Diagnosis Date Noted  . Intermittent claudication ([place]) 05/25/2020  . Hypoglycemia due to insulin 10/15/2019  . Chronic anticoagulation 10/15/2019  . COVID-19 virus detected 06/18/2019  . Right sided weakness 06/17/2019  . AKI (acute kidney injury) ([place]) 06/17/2019  . Foot pain, bilateral 02/01/2019  . Abdominal pain 01/23/2019  . Chest pain 01/23/2019  . Right leg pain 12/20/2018  . Overgrown toenails 12/20/2018  . Unsteady gait 12/20/2018  . Encounter for therapeutic drug monitoring 10/22/2018  . Nausea and vomiting 03/10/2018  . AF (paroxysmal atrial fibrillation) ([place]) 03/10/2018  . Non-cardiac chest pain 03/09/2018  . Diabetic neuropathy ([place]) 09/26/2017  . MCI (mild cognitive impairment) 06/23/2017  . OSA on CPAP 05/10/2017  . Gastroesophageal reflux disease 05/10/2017  . Insomnia 05/10/2017  . 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 Chronic systolic heart failure ([place]) 11/22/2015  . Essential hypertension 11/22/2015  . DM (diabetes mellitus) ([place]) 11/20/2015  . HLD (hyperlipidemia) 11/20/2015  . Hx of adenomatous colonic polyps 05/19/2011    Current Outpatient Medications:  .  albuterol (VENTOLIN HFA) 108 (90 Base) MCG/ACT inhaler, Inhale 2 puffs into the lungs every 6 (six) hours as needed for wheezing or shortness of breath., Disp: 6.7 g, Rfl: 0 .  amitriptyline  (ELAVIL) 10 MG tablet, Take 2 tablets at bed time, Disp: 180 tablet, Rfl: 3 .  atorvastatin (LIPITOR) 80 MG tablet, Take 1 tablet (80 mg total) by mouth daily., Disp: 90 tablet, Rfl: 3 .  carvedilol (COREG) 6.25 MG tablet, Take 1 tablet (6.25 mg total) by mouth 2 (two) times daily with a meal., Disp: 180 tablet, Rfl: 3 .  dapagliflozin propanediol (FARXIGA) 10 MG TABS tablet, Take 1 tablet (10 mg total) by mouth daily before breakfast., Disp: 90 tablet, Rfl: 3 .  donepezil (ARICEPT) 10 MG tablet, Take 1 tablet (10 mg total) by mouth at bedtime., Disp: 90 tablet, Rfl: 3 .  ELIQUIS 5 MG TABS tablet, TAKE 1 TABLET BY MOUTH TWICE DAILY, Disp: 60 tablet, Rfl: 5 .  Evolocumab (REPATHA SURECLICK) 876 MG/ML SOAJ, Inject 1 pen into the skin every 14 (fourteen) days., Disp: 6 pen, Rfl: 3 .  ezetimibe (ZETIA) 10 MG tablet, Take 1 tablet (10 mg total) by mouth daily., Disp: 90 tablet, Rfl: 3 .  insulin degludec (TRESIBA FLEXTOUCH) 200 UNIT/ML FlexTouch Pen, Inject 46 Units into the skin daily., Disp: 18 mL, Rfl: 3 .  Insulin Syringe-Needle U-100 (INSULIN SYRINGE .3CC/29GX1/2") 29G X 1/2" 0.3 ML MISC, Inject 1 Syringe 3 (three) times daily as directed. Check blood sugar three times daily. Dx: E11.9 (Patient taking differently: Inject 1 Syringe as directed See admin instructions. Check blood sugar three times daily. Dx: E11.9), Disp: 100 each, Rfl: 3 .  Lancets (ONETOUCH DELICA PLUS OTLXBW62M) MISC, USE TWICE DAILY, Disp: 200 each, Rfl: 3 .  lidocaine (LMX) 4 % cream, Apply 1 application topically 3 (three) times daily as needed., Disp: 30 g, Rfl: 0 .  lisinopril (ZESTRIL) 20 MG tablet, Take 1 tablet (20 mg total) by mouth at bedtime., Disp: 30 tablet, Rfl: 6 .  nitroGLYCERIN (NITROSTAT) 0.4 MG SL tablet, Place 1 tablet (0.4 mg total) under the tongue every 5 (five) minutes as needed for chest pain., Disp: 30 tablet, Rfl: 1 .  ONETOUCH ULTRA test strip, USE TWICE DAILY, Disp: 200 strip, Rfl: 3 .  polyethylene  glycol (MIRALAX / GLYCOLAX) 17 g packet, Take 17 g by mouth 2 (two) times daily., Disp: 14 each, Rfl: 0 .  predniSONE (DELTASONE) 20 MG tablet, Take 1 tablet (20 mg total) by mouth 2 (two) times daily., Disp: 10 tablet, Rfl: 0 .  spironolactone (ALDACTONE) 25 MG tablet, TAKE 1 TABLET BY MOUTH  DAILY, Disp: 90 tablet, Rfl: 3 .  triamcinolone (KENALOG) 0.1 %, Apply 1 application topically 3 (three) times daily as needed. For itching, Disp: 45 g, Rfl: 1 No Known Allergies    Social History   Socioeconomic History  . Marital status: Single    Spouse name: Not on file  . Number of children: 0  . Years of education: Not on file  . Highest education level: Not on file  Occupational History  . Occupation: Disability  Tobacco Use  . Smoking status: Never Smoker  . Smokeless tobacco: Never Used  Vaping Use  . Vaping Use: Never used  Substance and Sexual Activity  . Alcohol use: No  . Drug use: No  . Sexual activity: Yes    Birth control/protection: None  Other Topics Concern  . Not on file  Social History Narrative   Social History   Diet?    Do you drink/eat things with caffeine? yes   Marital status?       single      Do you live in a house, apartment, assisted living, condo, trailer, etc.? yes   Is it one or more stories? One story   How many persons live in your home?   Do you have any pets in your home? (please list)    Highest level of education completed? graduate   Do you exercise?            no                          Type & how often?   Advanced Directives   Do you have a living will? no   Do you have a DNR form?                                  If not, do you want to discuss one? no   Do you have signed POA/HPOA for forms? no      Functional Status   Do you have difficulty bathing or dressing yourself? no   Do you have difficulty preparing food or eating? no   Do you have difficulty managing your medications? no   Do you have difficulty managing your finances? no    Do you have difficulty affording your medications? no   Social Determinants of Health   Financial Resource Strain: Not on file  Food Insecurity: Not on file  Transportation Needs: Not on file  Physical Activity: Not on file  Stress: Not on file  Social Connections: Not on file  Intimate Partner Violence: Not on file    Physical Exam      Future Appointments  Date Time Provider [place]  08/11/2020  1:00 PM [name], MD [facility] None  08/26/2020 10:00 AM Jon Billings, RN [facility] None  09/21/2020 11:30 AM [name], NP [facility] None    BP (!) 146/88   Pulse 88   Resp 18   Wt 168 lb (76.2 kg)   SpO2 98%   BMI 30.73 kg/m  CBG EMS-218 Weight yesterday-? Last visit weight-166  Pt reports he is doing alright, he had several falls in the past week-he states his legs gave out on him. He did not pass out, no injuries from the fall.  He denies increased sob, dizzy at times but has always had that problem-nothing new or worsening. We spoke at length about his CBG's-he states they are still running high. I advised him that elevated blood sugars could cause him to fall/lose balance and make him feel bad. His diet plays a large role in those high numbers. We spoke at length about his diet and better choices. He said he would try.  He is out of atorvastatin and eliquis. He said he called it in to [facility] and will get it. I made a note for him to place it in certain spots.  meds verified and a month worth of pill box was done. He has moved from [facility] back to [place]. So I will have to move his meds to [facility] mail order.  His b/p is elevated but he just took his meds about 42mn ago.   KMarylouise Stacks EFall RiverCEastern New Mexico Medical CenterParamedic  08/04/20

## 2020-08-05 ENCOUNTER — Other Ambulatory Visit: Payer: Self-pay

## 2020-08-05 ENCOUNTER — Telehealth: Payer: Self-pay | Admitting: Neurology

## 2020-08-05 ENCOUNTER — Other Ambulatory Visit (HOSPITAL_COMMUNITY): Payer: Self-pay

## 2020-08-05 MED ORDER — EZETIMIBE 10 MG PO TABS
10.0000 mg | ORAL_TABLET | Freq: Every day | ORAL | 2 refills | Status: DC
Start: 2020-08-05 — End: 2021-01-13

## 2020-08-05 MED ORDER — CARVEDILOL 6.25 MG PO TABS
6.2500 mg | ORAL_TABLET | Freq: Two times a day (BID) | ORAL | 3 refills | Status: DC
Start: 2020-08-05 — End: 2020-08-07

## 2020-08-05 MED ORDER — LISINOPRIL 20 MG PO TABS
20.0000 mg | ORAL_TABLET | Freq: Every day | ORAL | 6 refills | Status: DC
Start: 2020-08-05 — End: 2020-08-07

## 2020-08-05 MED ORDER — SPIRONOLACTONE 25 MG PO TABS
25.0000 mg | ORAL_TABLET | Freq: Every day | ORAL | 3 refills | Status: DC
Start: 2020-08-05 — End: 2020-08-07

## 2020-08-05 MED ORDER — ATORVASTATIN CALCIUM 80 MG PO TABS: 80.0000 mg | ORAL_TABLET | Freq: Every day | ORAL | 3 refills | Status: DC

## 2020-08-05 NOTE — Telephone Encounter (Signed)
Pls let patient know he needs to be seen at least once a year for continued refills, otherwise he can also ask PCP for refills. Thanks

## 2020-08-06 NOTE — Telephone Encounter (Signed)
Patient notified, he is going to call his PCP.

## 2020-08-07 ENCOUNTER — Other Ambulatory Visit (HOSPITAL_COMMUNITY): Payer: Self-pay

## 2020-08-07 ENCOUNTER — Other Ambulatory Visit: Payer: Self-pay

## 2020-08-07 MED ORDER — ATORVASTATIN CALCIUM 80 MG PO TABS: 80.0000 mg | ORAL_TABLET | Freq: Every day | ORAL | 3 refills | Status: DC

## 2020-08-07 MED ORDER — CARVEDILOL 6.25 MG PO TABS: 6.2500 mg | ORAL_TABLET | Freq: Two times a day (BID) | ORAL | 3 refills | Status: DC

## 2020-08-07 MED ORDER — SPIRONOLACTONE 25 MG PO TABS
25.0000 mg | ORAL_TABLET | Freq: Every day | ORAL | 3 refills | Status: DC
Start: 2020-08-07 — End: 2020-10-15

## 2020-08-07 MED ORDER — LISINOPRIL 20 MG PO TABS: 20.0000 mg | ORAL_TABLET | Freq: Every day | ORAL | 6 refills | Status: DC

## 2020-08-11 ENCOUNTER — Other Ambulatory Visit: Payer: Self-pay

## 2020-08-11 ENCOUNTER — Ambulatory Visit (INDEPENDENT_AMBULATORY_CARE_PROVIDER_SITE_OTHER): Payer: Medicare HMO | Admitting: Endocrinology

## 2020-08-11 VITALS — BP 120/64 | HR 86 | Ht 66.25 in | Wt 179.8 lb

## 2020-08-11 DIAGNOSIS — Z794 Long term (current) use of insulin: Secondary | ICD-10-CM

## 2020-08-11 DIAGNOSIS — E11311 Type 2 diabetes mellitus with unspecified diabetic retinopathy with macular edema: Secondary | ICD-10-CM | POA: Diagnosis not present

## 2020-08-11 DIAGNOSIS — E1140 Type 2 diabetes mellitus with diabetic neuropathy, unspecified: Secondary | ICD-10-CM

## 2020-08-11 LAB — POCT GLYCOSYLATED HEMOGLOBIN (HGB A1C): Hemoglobin A1C: 8.4 % — AB (ref 4.0–5.6)

## 2020-08-11 MED ORDER — TRESIBA FLEXTOUCH 200 UNIT/ML ~~LOC~~ SOPN: 48.0000 [IU] | PEN_INJECTOR | Freq: Every day | SUBCUTANEOUS | 3 refills | Status: DC

## 2020-08-11 NOTE — Patient Instructions (Addendum)
Please increase the Tresiba to 48 units daily.   On this type of insulin, you should eat meals on a regular schedule.  If a meal is missed or significantly delayed, your blood sugar could go low.   check your blood sugar twice a day.  vary the time of day when you check, between before the 3 meals, and at bedtime.  also check if you have symptoms of your blood sugar being too high or too low.  please keep a record of the readings and bring it to your next appointment here (or you can bring the meter itself).  You can write it on any piece of paper.  please call us sooner if your blood sugar goes below 70, or if you have a lot of readings over 200.   Please come back for a follow-up appointment in 2 months.

## 2020-08-11 NOTE — Progress Notes (Signed)
Subjective:    Patient ID: Calvin [name], male    DOB: 28-Jul-1960, 60 y.o.   MRN: [id number]  HPI Pt returns for f/u of diabetes mellitus:  DM type: Insulin-requiring type 2. Dx'ed: 9678 Complications: PN, CAD, CRI, and DR.   Therapy: insulin since soon after dx.  DKA: never Severe hypoglycemia: once, in 2021.   Pancreatitis: never Pancreatic imaging: normal on 2003 CT. SDOH: due to noncompliance, he is not a candidate for multiple daily injections; he is retired; he gets insulin from pt assistance; changes between brands must be minimized, due to LHL.   Other: up to early 2021, he was taking more than 200 units QD; pt requests that insulin be titrated up very slowly, due to h/o severe hypoglycemia.    Interval history: no cbg record, but states cbg's vary from 200-300.  He says he takes insulin as rx'ed.   Past Medical History:  Diagnosis Date  . Adenoidal hypertrophy   . Adenomatous colon polyps 2012  . Arthritis   . Diabetes mellitus   . GERD (gastroesophageal reflux disease)   . Heart attack ([place])    [place]  [name] Hx of adenomatous colonic polyps 08/18/2017  . Hyperlipidemia   . Hypertension   . Mild CAD    a. cath in 08/2017 showing mild nonobstructive CAD with scattered 20-30% stenosis.   . Nonischemic cardiomyopathy ([place])    a. EF 20-25% by echo in 09/2017 with cath showing mild CAD. b.  Last echo 12/2017 EF 35-40%, grade 2 DD.  [name] Obesity   . PAF (paroxysmal atrial fibrillation) ([place])   . Sleep apnea    cpap, pt says no longer has    Past Surgical History:  Procedure Laterality Date  . COLONOSCOPY  05/13/11   9 adenomas  . FOREARM SURGERY    . MUSCLE BIOPSY    . RIGHT/LEFT HEART CATH AND CORONARY ANGIOGRAPHY N/A 08/25/2017   Procedure: RIGHT/LEFT HEART CATH AND CORONARY ANGIOGRAPHY;  Surgeon: [name], MD;  Location: [facility];  Service: Cardiovascular;  Laterality: N/A;  . RIGHT/LEFT HEART CATH AND CORONARY ANGIOGRAPHY N/A  10/01/2019   Procedure: RIGHT/LEFT HEART CATH AND CORONARY ANGIOGRAPHY;  Surgeon: [name], MD;  Location: [facility];  Service: Cardiovascular;  Laterality: N/A;    Social History   Socioeconomic History  . Marital status: Single    Spouse name: Not on file  . Number of children: 0  . Years of education: Not on file  . Highest education level: Not on file  Occupational History  . Occupation: Disability  Tobacco Use  . Smoking status: Never Smoker  . Smokeless tobacco: Never Used  Vaping Use  . Vaping Use: Never used  Substance and Sexual Activity  . Alcohol use: No  . Drug use: No  . Sexual activity: Yes    Birth control/protection: None  Other Topics Concern  . Not on file  Social History Narrative   Social History   Diet?    Do you drink/eat things with caffeine? yes   Marital status?       single      Do you live in a house, apartment, assisted living, condo, trailer, etc.? yes   Is it one or more stories? One story   How many persons live in your home?   Do you have any pets in your home? (please list)    Highest level of education completed? graduate   Do you  exercise?            no                          Type & how often?   Advanced Directives   Do you have a living will? no   Do you have a DNR form?                                  If not, do you want to discuss one? no   Do you have signed POA/HPOA for forms? no      Functional Status   Do you have difficulty bathing or dressing yourself? no   Do you have difficulty preparing food or eating? no   Do you have difficulty managing your medications? no   Do you have difficulty managing your finances? no   Do you have difficulty affording your medications? no   Social Determinants of Health   Financial Resource Strain: Not on file  Food Insecurity: Not on file  Transportation Needs: Not on file  Physical Activity: Not on file  Stress: Not on file  Social Connections: Not on file   Intimate Partner Violence: Not on file    Current Outpatient Medications on File Prior to Visit  Medication Sig Dispense Refill  . albuterol (VENTOLIN HFA) 108 (90 Base) MCG/ACT inhaler Inhale 2 puffs into the lungs every 6 (six) hours as needed for wheezing or shortness of breath. 6.7 g 0  . amitriptyline (ELAVIL) 10 MG tablet Take 2 tablets at bed time 180 tablet 3  . atorvastatin (LIPITOR) 80 MG tablet Take 1 tablet (80 mg total) by mouth daily. 90 tablet 3  . carvedilol (COREG) 6.25 MG tablet Take 1 tablet (6.25 mg total) by mouth 2 (two) times daily with a meal. 180 tablet 3  . dapagliflozin propanediol (FARXIGA) 10 MG TABS tablet Take 1 tablet (10 mg total) by mouth daily before breakfast. 90 tablet 3  . donepezil (ARICEPT) 10 MG tablet Take 1 tablet (10 mg total) by mouth at bedtime. 90 tablet 3  . ELIQUIS 5 MG TABS tablet TAKE 1 TABLET BY MOUTH TWICE DAILY 60 tablet 5  . Evolocumab (REPATHA SURECLICK) 509 MG/ML SOAJ Inject 1 pen into the skin every 14 (fourteen) days. 6 pen 3  . ezetimibe (ZETIA) 10 MG tablet Take 1 tablet (10 mg total) by mouth daily. 90 tablet 2  . Insulin Syringe-Needle U-100 (INSULIN SYRINGE .3CC/29GX1/2") 29G X 1/2" 0.3 ML MISC Inject 1 Syringe 3 (three) times daily as directed. Check blood sugar three times daily. Dx: E11.9 (Patient taking differently: Inject 1 Syringe as directed See admin instructions. Check blood sugar three times daily. Dx: E11.9) 100 each 3  . Lancets (ONETOUCH DELICA PLUS [id number]) MISC USE TWICE DAILY 200 each 3  . lidocaine (LMX) 4 % cream Apply 1 application topically 3 (three) times daily as needed. 30 g 0  . lisinopril (ZESTRIL) 20 MG tablet Take 1 tablet (20 mg total) by mouth at bedtime. 30 tablet 6  . nitroGLYCERIN (NITROSTAT) 0.4 MG SL tablet Place 1 tablet (0.4 mg total) under the tongue every 5 (five) minutes as needed for chest pain. 30 tablet 1  . ONETOUCH ULTRA test strip USE TWICE DAILY 200 strip 3  . polyethylene glycol  (MIRALAX / GLYCOLAX) 17 g packet Take 17 g by mouth 2 (two) times daily.  14 each 0  . predniSONE (DELTASONE) 20 MG tablet Take 1 tablet (20 mg total) by mouth 2 (two) times daily. 10 tablet 0  . spironolactone (ALDACTONE) 25 MG tablet Take 1 tablet (25 mg total) by mouth daily. 90 tablet 3  . triamcinolone (KENALOG) 0.1 % Apply 1 application topically 3 (three) times daily as needed. For itching 45 g 1   No current facility-administered medications on file prior to visit.    No Known Allergies  Family History  Problem Relation Age of Onset  . Heart disease Mother   . Heart attack Mother 11  . Hypertension Mother   . Hyperlipidemia Mother   . Diabetes Mother   . Heart disease Father   . Heart attack Father 69  . Hypertension Father   . Hyperlipidemia Father   . Diabetes Father   . Heart attack Sister 70  . Colon cancer Neg Hx   . Stomach cancer Neg Hx   . Esophageal cancer Neg Hx   . Rectal cancer Neg Hx     BP 120/64 (BP Location: Right Arm, Patient Position: Sitting, Cuff Size: Normal)   Pulse 86   Ht 5' 6.25" (1.683 m)   Wt 179 lb 12.8 oz (81.6 kg)   SpO2 98%   BMI 28.80 kg/m    Review of Systems     Objective:   Physical Exam VITAL SIGNS:  See vs page GENERAL: no distress Pulses: dorsalis pedis intact bilat.   MSK: no deformity of the feet CV: no leg edema Skin:  no ulcer on the feet.  normal color and temp on the feet. Neuro: sensation is intact to touch on the feet.    A1c=8.4%  Lab Results  Component Value Date   CREATININE 1.36 (H) 06/24/2020   BUN 18 06/24/2020   NA 139 06/24/2020   K 4.5 06/24/2020   CL 103 06/24/2020   CO2 26 06/24/2020       Assessment & Plan:  Insulin-requiring type 2 DM, with CRI: uncontrolled.   Patient Instructions  Please increase the Tresiba to 48 units daily.   On this type of insulin, you should eat meals on a regular schedule.  If a meal is missed or significantly delayed, your blood sugar could go low.   check  your blood sugar twice a day.  vary the time of day when you check, between before the 3 meals, and at bedtime.  also check if you have symptoms of your blood sugar being too high or too low.  please keep a record of the readings and bring it to your next appointment here (or you can bring the meter itself).  You can write it on any piece of paper.  please call us sooner if your blood sugar goes below 70, or if you have a lot of readings over 200.   Please come back for a follow-up appointment in 2 months.

## 2020-08-13 ENCOUNTER — Other Ambulatory Visit: Payer: Self-pay

## 2020-08-20 ENCOUNTER — Encounter: Payer: Self-pay | Admitting: Internal Medicine

## 2020-08-20 NOTE — Progress Notes (Signed)
[facility] Note  08/24/2020     CHIEF COMPLAINT Patient presents for Retina Follow Up   HISTORY OF PRESENT ILLNESS: Calvin [name] is a 60 y.o. male who presents to the clinic today for:  HPI    Retina Follow Up    Patient presents with  Diabetic Retinopathy.  In both eyes.  This started 2 months ago.  I, the attending physician,  performed the HPI with the patient and updated documentation appropriately.          Comments    Patient here for Retina Evaluation for DM. Referred by Dr Darcey Nora. Patient states vision still the same. No eye pain. Dr saw cataracts.       Last edited by [name], MD on 08/24/2020 11:44 AM. (History)    pt is delayed from 4 weeks to 9 weeks today, he states vision is stable   Referring physician: [facility] [address],  [place] [id number]  HISTORICAL INFORMATION:   Selected notes from the MEDICAL RECORD NUMBER Referred by Dr. [name] originally Re-referred by Dr. Darcey Nora ([facility]) for DM f/u Uncontrolled DM II on insulin A1C: 10.1   CURRENT MEDICATIONS: No current outpatient medications on file. (Ophthalmic Drugs)   No current facility-administered medications for this visit. (Ophthalmic Drugs)   Current Outpatient Medications (Other)  Medication Sig  . albuterol (VENTOLIN HFA) 108 (90 Base) MCG/ACT inhaler Inhale 2 puffs into the lungs every 6 (six) hours as needed for wheezing or shortness of breath.  [name] amitriptyline (ELAVIL) 10 MG tablet Take 2 tablets at bed time  . atorvastatin (LIPITOR) 80 MG tablet Take 1 tablet (80 mg total) by mouth daily.  . carvedilol (COREG) 6.25 MG tablet Take 1 tablet (6.25 mg total) by mouth 2 (two) times daily with a meal.  . dapagliflozin propanediol (FARXIGA) 10 MG TABS tablet Take 1 tablet (10 mg total) by mouth daily before breakfast.  . donepezil (ARICEPT) 10 MG tablet Take 1 tablet (10 mg total) by mouth at  bedtime.  [name] ELIQUIS 5 MG TABS tablet TAKE 1 TABLET BY MOUTH TWICE DAILY  . Evolocumab (REPATHA SURECLICK) 659 MG/ML SOAJ Inject 1 pen into the skin every 14 (fourteen) days.  [name] ezetimibe (ZETIA) 10 MG tablet Take 1 tablet (10 mg total) by mouth daily.  . insulin degludec (TRESIBA FLEXTOUCH) 200 UNIT/ML FlexTouch Pen Inject 48 Units into the skin daily.  . Insulin Syringe-Needle U-100 (INSULIN SYRINGE .3CC/29GX1/2") 29G X 1/2" 0.3 ML MISC Inject 1 Syringe 3 (three) times daily as directed. Check blood sugar three times daily. Dx: E11.9 (Patient taking differently: Inject 1 Syringe as directed See admin instructions. Check blood sugar three times daily. Dx: E11.9)  . Lancets (ONETOUCH DELICA PLUS [id number]) MISC USE TWICE DAILY  . lidocaine (LMX) 4 % cream Apply 1 application topically 3 (three) times daily as needed.  [name] lisinopril (ZESTRIL) 20 MG tablet Take 1 tablet (20 mg total) by mouth at bedtime.  . nitroGLYCERIN (NITROSTAT) 0.4 MG SL tablet Place 1 tablet (0.4 mg total) under the tongue every 5 (five) minutes as needed for chest pain.  [name] ULTRA test strip USE TWICE DAILY  . polyethylene glycol (MIRALAX / GLYCOLAX) 17 g packet Take 17 g by mouth 2 (two) times daily.  . predniSONE (DELTASONE) 20 MG tablet Take 1 tablet (20 mg total) by mouth 2 (two) times daily.  [name] spironolactone (ALDACTONE) 25 MG  tablet Take 1 tablet (25 mg total) by mouth daily.  [name] triamcinolone (KENALOG) 0.1 % Apply 1 application topically 3 (three) times daily as needed. For itching   No current facility-administered medications for this visit. (Other)      REVIEW OF SYSTEMS: ROS    Positive for: Gastrointestinal, Genitourinary, Musculoskeletal, Endocrine, Cardiovascular, Eyes, Respiratory   Negative for: Constitutional, Neurological, Skin, HENT, Psychiatric, Allergic/Imm, Heme/Lymph   Last edited by [name], COA on 08/24/2020  9:36 AM. (History)       ALLERGIES No Known Allergies  PAST  MEDICAL HISTORY Past Medical History:  Diagnosis Date  . Adenoidal hypertrophy   . Adenomatous colon polyps 2012  . Arthritis   . Diabetes mellitus   . GERD (gastroesophageal reflux disease)   . Heart attack ([place])    [place]  [name] Hx of adenomatous colonic polyps 08/18/2017  . Hyperlipidemia   . Hypertension   . Mild CAD    a. cath in 08/2017 showing mild nonobstructive CAD with scattered 20-30% stenosis.   . Nonischemic cardiomyopathy ([place])    a. EF 20-25% by echo in 09/2017 with cath showing mild CAD. b.  Last echo 12/2017 EF 35-40%, grade 2 DD.  [name] Obesity   . PAF (paroxysmal atrial fibrillation) ([place])   . Sleep apnea    cpap, pt says no longer has   Past Surgical History:  Procedure Laterality Date  . COLONOSCOPY  05/13/11   9 adenomas  . FOREARM SURGERY    . MUSCLE BIOPSY    . RIGHT/LEFT HEART CATH AND CORONARY ANGIOGRAPHY N/A 08/25/2017   Procedure: RIGHT/LEFT HEART CATH AND CORONARY ANGIOGRAPHY;  Surgeon: [name], MD;  Location: [facility];  Service: Cardiovascular;  Laterality: N/A;  . RIGHT/LEFT HEART CATH AND CORONARY ANGIOGRAPHY N/A 10/01/2019   Procedure: RIGHT/LEFT HEART CATH AND CORONARY ANGIOGRAPHY;  Surgeon: [name], MD;  Location: [facility];  Service: Cardiovascular;  Laterality: N/A;    FAMILY HISTORY Family History  Problem Relation Age of Onset  . Heart disease Mother   . Heart attack Mother 4  . Hypertension Mother   . Hyperlipidemia Mother   . Diabetes Mother   . Heart disease Father   . Heart attack Father 75  . Hypertension Father   . Hyperlipidemia Father   . Diabetes Father   . Heart attack Sister 58  . Colon cancer Neg Hx   . Stomach cancer Neg Hx   . Esophageal cancer Neg Hx   . Rectal cancer Neg Hx     SOCIAL HISTORY Social History   Tobacco Use  . Smoking status: Never Smoker  . Smokeless tobacco: Never Used  Vaping Use  . Vaping Use: Never used  Substance Use Topics  .  Alcohol use: No  . Drug use: No         OPHTHALMIC EXAM:  Base Eye Exam    Visual Acuity (Snellen - Linear)      Right Left   Dist [place] 20/30 -1 20/70 -1   Dist ph [place] 20/25 -1 20/60       Tonometry (Tonopen, 9:32 AM)      Right Left   Pressure 18 19       Pupils      Dark Light Shape React APD   Right 3 2 Round Brisk None   Left 4 2 Round Brisk None       Visual Fields (Counting fingers)  Left Right    Full Full       Extraocular Movement      Right Left    Full Full       Neuro/Psych    Oriented x3: Yes   Mood/Affect: Normal       Dilation    Both eyes: 1.0% Mydriacyl, 2.5% Phenylephrine @ 9:32 AM        Slit Lamp and Fundus Exam    Slit Lamp Exam      Right Left   Lids/Lashes Dermatochalasis - upper lid, Meibomian gland dysfunction Dermatochalasis - upper lid, Meibomian gland dysfunction   Conjunctiva/Sclera nasal and temporal pinguecula, Melanosis nasal and temporal pinguecula, Melanosis   Cornea arcus, 2-3+ Punctate epithelial erosions, tear film debris arcus, 2+ inferior Punctate epithelial erosions   Anterior Chamber deep, clear, narrow temporal angle Deep and quiet   Iris Round and dilated, No NVI Round and dilated, No NVI   Lens 2-3+ Nuclear sclerosis, 2-3+ Cortical cataract 2-3+ Nuclear sclerosis, 2-3+ Cortical cataract   Vitreous Vitreous syneresis Vitreous syneresis       Fundus Exam      Right Left   Disc Pink and Sharp, no NVD Pink and Sharp, no NVD, Compact   C/D Ratio 0.3 0.2   Macula Blunted foveal reflex, mild improvement in central edema, scattered MA/IRH -- improving, extensive exudates - persistent Blunted foveal reflex, massive central edema with severe exudates nasal to fovea -- mild interval improvement, scattered IRH   Vessels attenuated, Tortuous, ST venule dilated attenuated, Tortuous   Periphery Attached, scattered IRH/DBH, exudate greatest posteriorly    Attached, scattered IRH/DBH, exudate greatest posteriorly              IMAGING AND PROCEDURES  Imaging and Procedures for 08/24/2020  OCT, Retina - OU - Both Eyes       Right Eye Quality was good. Central Foveal Thickness: 316. Progression has improved. Findings include abnormal foveal contour, intraretinal fluid, intraretinal hyper-reflective material, subretinal hyper-reflective material, vitreomacular adhesion , no SRF (Mild Interval increase in temporal IRF, interval improvement in foveal profile).   Left Eye Quality was good. Central Foveal Thickness: 466. Progression has improved. Findings include abnormal foveal contour, intraretinal fluid, intraretinal hyper-reflective material, no SRF (Interval increase in IRF, but interval improvement in central SRF).   Notes *Images captured and stored on drive  Diagnosis / Impression:  Central DME OU (OS > OD) OD: Mild Interval increase in temporal IRF, interval improvement in foveal profile OS: Interval increase in IRF, but interval improvement in central SRF  Clinical management:  See below  Abbreviations: NFP - Normal foveal profile. CME - cystoid macular edema. PED - pigment epithelial detachment. IRF - intraretinal fluid. SRF - subretinal fluid. EZ - ellipsoid zone. ERM - epiretinal membrane. ORA - outer retinal atrophy. ORT - outer retinal tubulation. SRHM - subretinal hyper-reflective material. IRHM - intraretinal hyper-reflective material        Intravitreal Injection, Pharmacologic Agent - OD - Right Eye       Time Out 08/24/2020. 10:04 AM. Confirmed correct patient, procedure, site, and patient consented.   Anesthesia Topical anesthesia was used. Anesthetic medications included Lidocaine 2%, Proparacaine 0.5%.   Procedure Preparation included 5% betadine to ocular surface, eyelid speculum. A supplied needle was used.   Injection:  1.25 mg Bevacizumab (AVASTIN) 1.32m/0.05mL SOLN   NDC: 565784-696-29 Lot:: [id number] Expiration date: 10/12/2020   Route: Intravitreal, Site: Right Eye,  Waste: 0 mL  Post-op Post injection exam found visual  acuity of at least counting fingers. The patient tolerated the procedure well. There were no complications. The patient received written and verbal post procedure care education. Post injection medications were not given.        Intravitreal Injection, Pharmacologic Agent - OS - Left Eye       Time Out 08/24/2020. 10:05 AM. Confirmed correct patient, procedure, site, and patient consented.   Anesthesia Topical anesthesia was used. Anesthetic medications included Lidocaine 2%, Proparacaine 0.5%.   Procedure Preparation included 5% betadine to ocular surface, eyelid speculum. A supplied needle was used.   Injection:  1.25 mg Bevacizumab (AVASTIN) 1.5m/0.05mL SOLN   NDC: 535573-220-25 Lot:: [id number] Expiration date: 10/08/2020   Route: Intravitreal, Site: Left Eye, Waste: 0.05 mL  Post-op Post injection exam found visual acuity of at least counting fingers. The patient tolerated the procedure well. There were no complications. The patient received written and verbal post procedure care education. Post injection medications were not given.                 ASSESSMENT/PLAN:    ICD-10-CM   1. Severe nonproliferative diabetic retinopathy of both eyes with macular edema associated with type 2 diabetes mellitus (HCC)  EJ62.8315Intravitreal Injection, Pharmacologic Agent - OD - Right Eye    Intravitreal Injection, Pharmacologic Agent - OS - Left Eye    Bevacizumab (AVASTIN) SOLN 1.25 mg    Bevacizumab (AVASTIN) SOLN 1.25 mg  2. Retinal edema  H35.81 OCT, Retina - OU - Both Eyes  3. Essential hypertension  I10   4. Hypertensive retinopathy of both eyes  H35.033   5. Combined forms of age-related cataract of both eyes  H25.813     1,2. Severe Non-proliferative diabetic retinopathy w/ DME, OU  - pt is delayed from 4 weeks to 9 weeks due to financial / insurance reasons  - A1c 8.4% on 02.08.22 -- steady improvement since July  2021 (10.1%)  - s/p IVA OS #1 (09.16.21), IVA OD #1 (09.21.21)             - s/p IVA OU #2 (10.19.21), #3 (11.16.21), #4 (12.14.21) - exam shows central edema OU, scattered IRH and severe exudates OU -- improving - FA (09.16.21) shows late leaking MA greatest posterior pole; no NV; +peripheral vascular perfusion defects -- may benefit from peripheral PRP OU - OCT shows OD: Mild Interval increase in temporal IRF, interval improvement in foveal profile; OS: Interval increase in IRF, but interval improvement in central SRF - recommend IVA OU #5 today, 02.21.22  - Avastin informed consent obtained, signed and scanned OD, 09.21.21 - Avastin informed consent obtained, signed and scanned, 09.16.21 (OS) - f/u 4 weeks, DFE, OCT, possible injection(s)  3,4. Hypertensive retinopathy OU - discussed importance of tight BP control - monitor  5. Mixed Cataract OU - The symptoms of cataract, surgical options, and treatments and risks were discussed with patient. - discussed diagnosis and progression - visually significant -- will refer for cat eval once DR more stable   Ophthalmic Meds Ordered this visit:  Meds ordered this encounter  Medications  . Bevacizumab (AVASTIN) SOLN 1.25 mg  . Bevacizumab (AVASTIN) SOLN 1.25 mg       Return in about 4 weeks (around 09/21/2020) for f/u NPDR OU, DFE, OCT.  There are no Patient Instructions on file for this visit.  This document serves as a record of services personally performed by BGardiner Sleeper MD, PhD. It was created on their behalf by ALeeann Must [facility]  an ophthalmic technician. The creation of this record is the provider's dictation and/or activities during the visit.    Electronically signed by: Leeann Must, COA 02.17.2022 12:29 PM   This document serves as a record of services personally performed by Gardiner Sleeper, MD, PhD. It was created on their behalf by San Jetty. Owens Shark, OA an ophthalmic technician. The creation of this record is the  provider's dictation and/or activities during the visit.    Electronically signed by: San Jetty. Owens Shark, [place] 02.21.2022 12:29 PM  Gardiner Sleeper, M.D., Ph.D. Diseases & Surgery of the Retina and [place] 08/24/2020    I have reviewed the above documentation for accuracy and completeness, and I agree with the above. Gardiner Sleeper, M.D., Ph.D. 08/24/20 12:29 PM   Abbreviations: M myopia (nearsighted); A astigmatism; H hyperopia (farsighted); P presbyopia; Mrx spectacle prescription;  CTL contact lenses; OD right eye; OS left eye; OU both eyes  XT exotropia; ET esotropia; PEK punctate epithelial keratitis; PEE punctate epithelial erosions; DES dry eye syndrome; MGD meibomian gland dysfunction; ATs artificial tears; PFAT's preservative free artificial tears; [place] nuclear sclerotic cataract; PSC posterior subcapsular cataract; ERM epi-retinal membrane; PVD posterior vitreous detachment; RD retinal detachment; DM diabetes mellitus; DR diabetic retinopathy; NPDR non-proliferative diabetic retinopathy; PDR proliferative diabetic retinopathy; CSME clinically significant macular edema; DME diabetic macular edema; dbh dot blot hemorrhages; CWS cotton wool spot; POAG primary open angle glaucoma; C/D cup-to-disc ratio; HVF humphrey visual field; GVF goldmann visual field; OCT optical coherence tomography; IOP intraocular pressure; BRVO Branch retinal vein occlusion; CRVO central retinal vein occlusion; CRAO central retinal artery occlusion; BRAO branch retinal artery occlusion; RT retinal tear; SB scleral buckle; PPV pars plana vitrectomy; VH Vitreous hemorrhage; PRP panretinal laser photocoagulation; IVK intravitreal kenalog; VMT vitreomacular traction; MH Macular hole;  NVD neovascularization of the disc; NVE neovascularization elsewhere; AREDS age related eye disease study; ARMD age related macular degeneration; POAG primary open angle glaucoma; EBMD epithelial/anterior basement  membrane dystrophy; ACIOL anterior chamber intraocular lens; IOL intraocular lens; PCIOL posterior chamber intraocular lens; Phaco/IOL phacoemulsification with intraocular lens placement; [place] photorefractive keratectomy; LASIK laser assisted in situ keratomileusis; HTN hypertension; DM diabetes mellitus; COPD chronic obstructive pulmonary disease

## 2020-08-24 ENCOUNTER — Ambulatory Visit (INDEPENDENT_AMBULATORY_CARE_PROVIDER_SITE_OTHER): Payer: Medicare HMO | Admitting: Ophthalmology

## 2020-08-24 ENCOUNTER — Encounter (INDEPENDENT_AMBULATORY_CARE_PROVIDER_SITE_OTHER): Payer: Self-pay | Admitting: Ophthalmology

## 2020-08-24 ENCOUNTER — Other Ambulatory Visit: Payer: Self-pay

## 2020-08-24 DIAGNOSIS — E113413 Type 2 diabetes mellitus with severe nonproliferative diabetic retinopathy with macular edema, bilateral: Secondary | ICD-10-CM

## 2020-08-24 DIAGNOSIS — I10 Essential (primary) hypertension: Secondary | ICD-10-CM | POA: Diagnosis not present

## 2020-08-24 DIAGNOSIS — H35033 Hypertensive retinopathy, bilateral: Secondary | ICD-10-CM

## 2020-08-24 DIAGNOSIS — H25813 Combined forms of age-related cataract, bilateral: Secondary | ICD-10-CM | POA: Diagnosis not present

## 2020-08-24 DIAGNOSIS — H3581 Retinal edema: Secondary | ICD-10-CM

## 2020-08-24 MED ORDER — BEVACIZUMAB CHEMO INJECTION 1.25MG/0.05ML SYRINGE FOR KALEIDOSCOPE
1.2500 mg | INTRAVITREAL | Status: AC | PRN
  Administered 2020-08-24: 1.25 mg via INTRAVITREAL

## 2020-08-26 ENCOUNTER — Other Ambulatory Visit: Payer: Self-pay

## 2020-08-26 NOTE — Patient Instructions (Signed)
Goals Addressed            This Visit's Progress   . [facility] - Monitor and Manage My Blood Sugar       Timeframe:  Long-Range Goal Priority:  High Start Date:  2/23//                           Expected End Date:  03/03/21                    Follow Up Date 10/01/20   - check blood sugar if I feel it is too high or too low - take the blood sugar log to all doctor visits    Why is this important?   Checking your blood sugar at home helps to keep it from getting very high or very low.  Writing the results in a diary or log helps the doctor know how to care for you.  Your blood sugar log should have the time, date and the results.  Also, write down the amount of insulin or other medicine that you take.  Other information, like what you ate, exercise done and how you were feeling, will also be helpful.     Notes:   11/8 - Reminded to monitor multiple times a day with insulin changes. Patient checks sugars regularly- patient reports sugars tend to be high in the am.    12/9 - Reviewed insulin changes as well as appointment with nutritionist    . [facility] - Set My Target A1C       Follow Up Date 09/09/2020   - set target A1C - Less than 8   Why is this important?   Your target A1C is decided together by you and your doctor.  It is based on several things like your age and other health issues.    Notes:   11/8 - decreased to 9.3 on 04/22/20  12/9 - decreased to 8.9 in December 2021  08/26/20- A1c 8.4 08-11-20

## 2020-08-26 NOTE — Patient Outreach (Signed)
[facility]) Care Management  [place]  08/26/2020   Calvin [name] 05/13/1961 [id number]  Subjective: Patient reports doing great,  A1c check 08-11-20 8.4.  Encouraged patient to continue diet.  Patient reports blood sugar tends to run high in the AM with last check 320 per patient.  Discussed diabetes management.  Patient denies concerns.   Objective:   Encounter Medications:  Outpatient Encounter Medications as of 08/26/2020  Medication Sig  . albuterol (VENTOLIN HFA) 108 (90 Base) MCG/ACT inhaler Inhale 2 puffs into the lungs every 6 (six) hours as needed for wheezing or shortness of breath.  [name] amitriptyline (ELAVIL) 10 MG tablet Take 2 tablets at bed time  . atorvastatin (LIPITOR) 80 MG tablet Take 1 tablet (80 mg total) by mouth daily.  . carvedilol (COREG) 6.25 MG tablet Take 1 tablet (6.25 mg total) by mouth 2 (two) times daily with a meal.  . dapagliflozin propanediol (FARXIGA) 10 MG TABS tablet Take 1 tablet (10 mg total) by mouth daily before breakfast.  . donepezil (ARICEPT) 10 MG tablet Take 1 tablet (10 mg total) by mouth at bedtime.  [name] ELIQUIS 5 MG TABS tablet TAKE 1 TABLET BY MOUTH TWICE DAILY  . Evolocumab (REPATHA SURECLICK) 710 MG/ML SOAJ Inject 1 pen into the skin every 14 (fourteen) days.  [name] ezetimibe (ZETIA) 10 MG tablet Take 1 tablet (10 mg total) by mouth daily.  . insulin degludec (TRESIBA FLEXTOUCH) 200 UNIT/ML FlexTouch Pen Inject 48 Units into the skin daily.  . Insulin Syringe-Needle U-100 (INSULIN SYRINGE .3CC/29GX1/2") 29G X 1/2" 0.3 ML MISC Inject 1 Syringe 3 (three) times daily as directed. Check blood sugar three times daily. Dx: E11.9 (Patient taking differently: Inject 1 Syringe as directed See admin instructions. Check blood sugar three times daily. Dx: E11.9)  . Lancets (ONETOUCH DELICA PLUS [id number]) MISC USE TWICE DAILY  . lidocaine (LMX) 4 % cream Apply 1 application topically 3 (three) times daily as needed.  [name]  lisinopril (ZESTRIL) 20 MG tablet Take 1 tablet (20 mg total) by mouth at bedtime.  . nitroGLYCERIN (NITROSTAT) 0.4 MG SL tablet Place 1 tablet (0.4 mg total) under the tongue every 5 (five) minutes as needed for chest pain.  [name] ULTRA test strip USE TWICE DAILY  . polyethylene glycol (MIRALAX / GLYCOLAX) 17 g packet Take 17 g by mouth 2 (two) times daily.  . predniSONE (DELTASONE) 20 MG tablet Take 1 tablet (20 mg total) by mouth 2 (two) times daily.  [name] spironolactone (ALDACTONE) 25 MG tablet Take 1 tablet (25 mg total) by mouth daily.  [name] triamcinolone (KENALOG) 0.1 % Apply 1 application topically 3 (three) times daily as needed. For itching   No facility-administered encounter medications on file as of 08/26/2020.    Functional Status:  In your present state of health, do you have any difficulty performing the following activities: 07/23/2020 10/15/2019  Hearing? - N  Vision? Y N  Difficulty concentrating or making decisions? Y N  Walking or climbing stairs? Y N  Dressing or bathing? N N  Doing errands, shopping? Y N  Comment does not drive often Facilities manager and eating ? N -  Using the Toilet? N -  In the past six months, have you accidently leaked urine? Y -  Do you have problems with loss of bowel control? N -  Managing your Medications? Y -  Managing your Finances? Y -  Housekeeping or managing your Housekeeping? Y -  Some recent  data might be hidden    Fall/Depression Screening: Fall Risk  07/23/2020 07/13/2020 07/01/2020  Falls in the past year? 1 0 0  Number falls in past yr: 1 0 0  Injury with Fall? 0 - -  Risk for fall due to : - - -  Follow up - - -   PHQ 2/9 Scores 07/23/2020 07/01/2020 05/25/2020 07/15/2019 05/01/2019 04/26/2018 06/20/2017  PHQ - 2 Score 0 1 0 0 0 0 0    Assessment:  Goals Addressed            This Visit's Progress   . [facility] - Monitor and Manage My Blood Sugar       Timeframe:  Long-Range Goal Priority:  High Start Date:  2/23//                            Expected End Date:  03/03/21                    Follow Up Date 10/01/20   - check blood sugar if I feel it is too high or too low - take the blood sugar log to all doctor visits    Why is this important?   Checking your blood sugar at home helps to keep it from getting very high or very low.  Writing the results in a diary or log helps the doctor know how to care for you.  Your blood sugar log should have the time, date and the results.  Also, write down the amount of insulin or other medicine that you take.  Other information, like what you ate, exercise done and how you were feeling, will also be helpful.     Notes:   11/8 - Reminded to monitor multiple times a day with insulin changes. Patient checks sugars regularly- patient reports sugars tend to be high in the am.    12/9 - Reviewed insulin changes as well as appointment with nutritionist    . [facility] - Set My Target A1C       Follow Up Date 09/09/2020   - set target A1C - Less than 8   Why is this important?   Your target A1C is decided together by you and your doctor.  It is based on several things like your age and other health issues.    Notes:   11/8 - decreased to 9.3 on 04/22/20  12/9 - decreased to 8.9 in December 2021  08/26/20- A1c 8.4 08-11-20       Plan: RN CM will follow in March Follow-up:  Patient agrees to Care Plan and Follow-up.   Jone Baseman, RN, MSN [place] Management Care Management Coordinator Direct Line [phone] Cell [phone] Toll Free: [phone]  Fax: [phone]

## 2020-08-31 ENCOUNTER — Telehealth: Payer: Self-pay

## 2020-08-31 ENCOUNTER — Encounter: Payer: Self-pay | Admitting: Internal Medicine

## 2020-08-31 NOTE — Telephone Encounter (Signed)
[facility] Pre-operative Risk Assessment     Request for surgical clearance:     Endoscopy Procedure  What type of surgery is being performed?     colonoscopy  When is this surgery scheduled?     10/06/2020  What type of clearance is required ?   Pharmacy  Are there any medications that need to be held prior to surgery and how long? Eliquis, 2 days  Practice name and name of physician performing surgery?      [facility], Dr Silvano Rusk  What is your office phone and fax number?      Phone- [phone]  [phone]  Anesthesia type (None, local, MAC, general) ?       MAC

## 2020-08-31 NOTE — Telephone Encounter (Signed)
Dr. Carlean Purl and PJ,  1. This patient's medication list states he is currently on Eliquis--a HOLD request will need to be requested;  2.This patient's most recent ECHO report states EF 30-35%- would you like for the patient to have a direct colon at [facility] or to be scheduled for an OV?  Please advise

## 2020-08-31 NOTE — Telephone Encounter (Signed)
A letter has been sent for Eliquis clearance.

## 2020-08-31 NOTE — Telephone Encounter (Signed)
Patient with diagnosis of afib on Eliquis for anticoagulation.    Procedure: endoscopy Date of procedure: 10/06/20  CHA2DS2-VASc Score = 4  This indicates a 4.8% annual risk of stroke. The patient's score is based upon: CHF History: Yes HTN History: Yes Diabetes History: Yes Stroke History: No Vascular Disease History: Yes Age Score: 0 Gender Score: 0   CrCl 64m/min Platelet count 240K  Per office protocol, patient can hold Eliquis for 2 days prior to procedure.

## 2020-08-31 NOTE — Telephone Encounter (Signed)
Thank you for the notification.  He had a cardiac catheterization after his echocardiogram and the ejection fraction was 35 to 40%.  A catheterization ejection fraction is more accurate than an echocardiogram just for your education.   Lets do this:  Continue with his appointments  Request to hold Eliquis 2 days prior to his colonoscopy through [facility] cardiology Dr. Haroldine Laws

## 2020-08-31 NOTE — Telephone Encounter (Signed)
Will route to PharmD for rec's re: holding anticoagulation. Richardson Dopp, [name]    08/31/2020 3:54 PM

## 2020-09-01 NOTE — Telephone Encounter (Signed)
Please inform patient at GI pre-visit.

## 2020-09-01 NOTE — Telephone Encounter (Signed)
We got clearance to hold his Eliqus for 2 days. Please inform him at his pre-visit.

## 2020-09-08 ENCOUNTER — Other Ambulatory Visit (HOSPITAL_COMMUNITY): Payer: Self-pay

## 2020-09-08 ENCOUNTER — Other Ambulatory Visit: Payer: Self-pay | Admitting: Cardiovascular Disease

## 2020-09-08 NOTE — Progress Notes (Signed)
Paramedicine Encounter    Patient ID: Calvin [name], male    DOB: [birth year], 60 y.o.   MRN: [id number]   [name], NP as PCP - General (Geriatric Medicine) [name], MD as PCP - Cardiology (Cardiology) [name], [name], MD as PCP - Advanced Heart Failure (Cardiology) [name], MD as Consulting Physician (Endocrinology) Pleasant, Eppie Gibson, RN as [place] Management Jon Billings, RN as [place] Management  Patient Active Problem List   Diagnosis Date Noted  . Intermittent claudication ([place]) 05/25/2020  . Hypoglycemia due to insulin 10/15/2019  . Chronic anticoagulation 10/15/2019  . Right sided weakness 06/17/2019  . Foot pain, bilateral 02/01/2019  . Overgrown toenails 12/20/2018  . Unsteady gait 12/20/2018  . Encounter for therapeutic drug monitoring 10/22/2018  . AF (paroxysmal atrial fibrillation) ([place]) 03/10/2018  . Diabetic neuropathy ([place]) 09/26/2017  . MCI (mild cognitive impairment) 06/23/2017  . OSA on CPAP 05/10/2017  . Gastroesophageal reflux disease 05/10/2017  . Insomnia 05/10/2017  . 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 Chronic systolic heart failure ([place]) 11/22/2015  . Essential hypertension 11/22/2015  . DM (diabetes mellitus) ([place]) 11/20/2015  . HLD (hyperlipidemia) 11/20/2015  . Hx of adenomatous colonic polyps 05/19/2011    Current Outpatient Medications:  .  albuterol (VENTOLIN HFA) 108 (90 Base) MCG/ACT inhaler, Inhale 2 puffs into the lungs every 6 (six) hours as needed for wheezing or shortness of breath., Disp: 6.7 g, Rfl: 0 .  amitriptyline (ELAVIL) 10 MG tablet, Take 2 tablets at bed time, Disp: 180 tablet, Rfl: 3 .  atorvastatin (LIPITOR) 80 MG tablet, Take 1 tablet (80 mg total) by mouth daily. (Patient taking differently: Take 80 mg by mouth every evening.), Disp: 90 tablet,  Rfl: 3 .  carvedilol (COREG) 6.25 MG tablet, Take 1 tablet (6.25 mg total) by mouth 2 (two) times daily with a meal., Disp: 180 tablet, Rfl: 3 .  dapagliflozin propanediol (FARXIGA) 10 MG TABS tablet, Take 1 tablet (10 mg total) by mouth daily before breakfast., Disp: 90 tablet, Rfl: 3 .  donepezil (ARICEPT) 10 MG tablet, Take 1 tablet (10 mg total) by mouth at bedtime., Disp: 90 tablet, Rfl: 3 .  ELIQUIS 5 MG TABS tablet, TAKE 1 TABLET BY MOUTH TWICE DAILY, Disp: 60 tablet, Rfl: 5 .  Evolocumab (REPATHA SURECLICK) 009 MG/ML SOAJ, Inject 1 pen into the skin every 14 (fourteen) days., Disp: 6 pen, Rfl: 3 .  ezetimibe (ZETIA) 10 MG tablet, Take 1 tablet (10 mg total) by mouth daily., Disp: 90 tablet, Rfl: 2 .  insulin degludec (TRESIBA FLEXTOUCH) 200 UNIT/ML FlexTouch Pen, Inject 48 Units into the skin daily., Disp: 18 mL, Rfl: 3 .  Insulin Syringe-Needle U-100 (INSULIN SYRINGE .3CC/29GX1/2") 29G X 1/2" 0.3 ML MISC, Inject 1 Syringe 3 (three) times daily as directed. Check blood sugar three times daily. Dx: E11.9 (Patient taking differently: Inject 1 Syringe as directed See admin instructions. Check blood sugar three times daily. Dx: E11.9), Disp: 100 each, Rfl: 3 .  Lancets (ONETOUCH DELICA PLUS [id number]) MISC, USE TWICE DAILY, Disp: 200 each, Rfl: 3 .  lidocaine (LMX) 4 % cream, Apply 1 application topically 3 (three) times daily as needed., Disp: 30 g, Rfl: 0 .  lisinopril (ZESTRIL) 20 MG tablet, Take 1 tablet (20 mg total) by mouth at bedtime., Disp: 30 tablet, Rfl: 6 .  ONETOUCH ULTRA test strip, USE TWICE DAILY, Disp:  200 strip, Rfl: 3 .  polyethylene glycol (MIRALAX / GLYCOLAX) 17 g packet, Take 17 g by mouth 2 (two) times daily., Disp: 14 each, Rfl: 0 .  spironolactone (ALDACTONE) 25 MG tablet, Take 1 tablet (25 mg total) by mouth daily., Disp: 90 tablet, Rfl: 3 .  triamcinolone (KENALOG) 0.1 %, Apply 1 application topically 3 (three) times daily as needed. For itching, Disp: 45 g, Rfl: 1 .   nitroGLYCERIN (NITROSTAT) 0.4 MG SL tablet, Place 1 tablet (0.4 mg total) under the tongue every 5 (five) minutes as needed for chest pain. (Patient not taking: Reported on 09/08/2020), Disp: 30 tablet, Rfl: 1 .  predniSONE (DELTASONE) 20 MG tablet, Take 1 tablet (20 mg total) by mouth 2 (two) times daily. (Patient not taking: Reported on 09/08/2020), Disp: 10 tablet, Rfl: 0 No Known Allergies    Social History   Socioeconomic History  . Marital status: Single    Spouse name: Not on file  . Number of children: 0  . Years of education: Not on file  . Highest education level: Not on file  Occupational History  . Occupation: Disability  Tobacco Use  . Smoking status: Never Smoker  . Smokeless tobacco: Never Used  Vaping Use  . Vaping Use: Never used  Substance and Sexual Activity  . Alcohol use: No  . Drug use: No  . Sexual activity: Yes    Birth control/protection: None  Other Topics Concern  . Not on file  Social History Narrative   Social History   Diet?    Do you drink/eat things with caffeine? yes   Marital status?       single      Do you live in a house, apartment, assisted living, condo, trailer, etc.? yes   Is it one or more stories? One story   How many persons live in your home?   Do you have any pets in your home? (please list)    Highest level of education completed? graduate   Do you exercise?            no                          Type & how often?   Advanced Directives   Do you have a living will? no   Do you have a DNR form?                                  If not, do you want to discuss one? no   Do you have signed POA/HPOA for forms? no      Functional Status   Do you have difficulty bathing or dressing yourself? no   Do you have difficulty preparing food or eating? no   Do you have difficulty managing your medications? no   Do you have difficulty managing your finances? no   Do you have difficulty affording your medications? no   Social Determinants of  Health   Financial Resource Strain: Not on file  Food Insecurity: Not on file  Transportation Needs: Not on file  Physical Activity: Not on file  Stress: Not on file  Social Connections: Not on file  Intimate Partner Violence: Not on file    Physical Exam      Future Appointments  Date Time Provider [place]  09/11/2020 10:45 AM Felipa Furnace, DPM [facility]  09/14/2020  9:00 AM [facility] PA/NP [facility] None  09/21/2020  8:00 AM [name], MD [facility] None  09/21/2020 11:30 AM [name], NP [facility] None  09/23/2020  9:00 AM [facility] PREVISIT [id number] [facility]  09/24/2020  9:00 AM Jon Billings, RN [facility] None  10/06/2020  8:00 AM [name], MD [facility]  10/15/2020  1:00 PM [name], MD [facility] None    BP 134/88   Pulse 78   Resp 18   Wt 168 lb (76.2 kg)   SpO2 98%   BMI 26.91 kg/m  CBG's-300+ Weight yesterday-168 Last visit weight-168  Pt reports he is doing ok, he had a cold a week or so ago and now has a lingering cough that is semi-productive. No relief with mucinex. He plans to go to urgent care for f/u.  No fevers. No sob. He just gets tired, some days are better than others.  Appetite is poor.  He makes himself eat so he can take the medicine.   [facility] bubble packs-will look into that for him.  He is out of the eliquis totally.[name]  He is out of farxiga for pill box #2.  Also out of the repatha. Called all those in today.  Filled up for 1 month of meds for him.  No edema noted. Lungs clear.   Marylouise Stacks, [facility] Paramedic  09/08/20

## 2020-09-11 ENCOUNTER — Ambulatory Visit: Payer: Medicare HMO | Admitting: Podiatry

## 2020-09-14 ENCOUNTER — Other Ambulatory Visit: Payer: Self-pay

## 2020-09-14 ENCOUNTER — Telehealth (HOSPITAL_COMMUNITY): Payer: Self-pay | Admitting: Licensed Clinical Social Worker

## 2020-09-14 ENCOUNTER — Encounter (HOSPITAL_COMMUNITY): Payer: Self-pay

## 2020-09-14 ENCOUNTER — Other Ambulatory Visit (HOSPITAL_COMMUNITY): Payer: Self-pay

## 2020-09-14 ENCOUNTER — Ambulatory Visit (HOSPITAL_COMMUNITY)
Admission: RE | Admit: 2020-09-14 | Discharge: 2020-09-14 | Disposition: A | Discharge status: Home / Self Care | Payer: Medicare HMO | Source: Ambulatory Visit | Attending: Cardiology | Admitting: Cardiology

## 2020-09-14 VITALS — BP 170/100 | HR 87 | Wt 184.6 lb

## 2020-09-14 DIAGNOSIS — E1165 Type 2 diabetes mellitus with hyperglycemia: Secondary | ICD-10-CM | POA: Diagnosis not present

## 2020-09-14 DIAGNOSIS — Z794 Long term (current) use of insulin: Secondary | ICD-10-CM | POA: Insufficient documentation

## 2020-09-14 DIAGNOSIS — I5043 Acute on chronic combined systolic (congestive) and diastolic (congestive) heart failure: Secondary | ICD-10-CM | POA: Diagnosis not present

## 2020-09-14 DIAGNOSIS — E785 Hyperlipidemia, unspecified: Secondary | ICD-10-CM | POA: Diagnosis not present

## 2020-09-14 DIAGNOSIS — I252 Old myocardial infarction: Secondary | ICD-10-CM | POA: Insufficient documentation

## 2020-09-14 DIAGNOSIS — I110 Hypertensive heart disease with heart failure: Secondary | ICD-10-CM | POA: Insufficient documentation

## 2020-09-14 DIAGNOSIS — I5022 Chronic systolic (congestive) heart failure: Secondary | ICD-10-CM | POA: Diagnosis not present

## 2020-09-14 DIAGNOSIS — I480 Paroxysmal atrial fibrillation: Secondary | ICD-10-CM | POA: Diagnosis not present

## 2020-09-14 DIAGNOSIS — Z79899 Other long term (current) drug therapy: Secondary | ICD-10-CM | POA: Insufficient documentation

## 2020-09-14 DIAGNOSIS — Z8616 Personal history of COVID-19: Secondary | ICD-10-CM | POA: Insufficient documentation

## 2020-09-14 DIAGNOSIS — I428 Other cardiomyopathies: Secondary | ICD-10-CM | POA: Insufficient documentation

## 2020-09-14 DIAGNOSIS — Z7901 Long term (current) use of anticoagulants: Secondary | ICD-10-CM | POA: Insufficient documentation

## 2020-09-14 DIAGNOSIS — I2510 Atherosclerotic heart disease of native coronary artery without angina pectoris: Secondary | ICD-10-CM | POA: Diagnosis not present

## 2020-09-14 LAB — COMPREHENSIVE METABOLIC PANEL
ALT: 24 U/L (ref 0–44)
AST: 24 U/L (ref 15–41)
Albumin: 3.8 g/dL (ref 3.5–5.0)
Alkaline Phosphatase: 70 U/L (ref 38–126)
Anion gap: 8 (ref 5–15)
BUN: 10 mg/dL (ref 6–20)
CO2: 27 mmol/L (ref 22–32)
Calcium: 9.6 mg/dL (ref 8.9–10.3)
Chloride: 104 mmol/L (ref 98–111)
Creatinine, Ser: 1.38 mg/dL — ABNORMAL HIGH (ref 0.61–1.24)
GFR, Estimated: 59 mL/min — ABNORMAL LOW (ref >60)
Glucose, Bld: 195 mg/dL — ABNORMAL HIGH (ref 70–99)
Potassium: 4.2 mmol/L (ref 3.5–5.1)
Sodium: 139 mmol/L (ref 135–145)
Total Bilirubin: 1.1 mg/dL (ref 0.3–1.2)
Total Protein: 7.3 g/dL (ref 6.5–8.1)

## 2020-09-14 LAB — CBC
HCT: 49.4 % (ref 39.0–52.0)
Hemoglobin: 16.3 g/dL (ref 13.0–17.0)
MCH: 29.1 pg (ref 26.0–34.0)
MCHC: 33 g/dL (ref 30.0–36.0)
MCV: 88.1 fL (ref 80.0–100.0)
Platelets: 225 10*3/uL (ref 150–400)
RBC: 5.61 MIL/uL (ref 4.22–5.81)
RDW: 12.9 % (ref 11.5–15.5)
WBC: 8.4 10*3/uL (ref 4.0–10.5)
nRBC: 0 % (ref 0.0–0.2)

## 2020-09-14 MED ORDER — LOSARTAN POTASSIUM 50 MG PO TABS: 50.0000 mg | ORAL_TABLET | Freq: Every day | ORAL | 3 refills | Status: DC

## 2020-09-14 MED ORDER — POTASSIUM CHLORIDE CRYS ER 10 MEQ PO TBCR: 10.0000 meq | EXTENDED_RELEASE_TABLET | Freq: Every day | ORAL | 11 refills | Status: DC

## 2020-09-14 MED ORDER — FUROSEMIDE 40 MG PO TABS: 40.0000 mg | ORAL_TABLET | Freq: Every day | ORAL | 11 refills | Status: DC

## 2020-09-14 NOTE — Progress Notes (Signed)
ADVANCED HF CLINIC NOTE  Referring Physician: Primary Care: Dr [name]  Primary Cardiologist: Dr Angelena Form  Neurology: Dr Delice Lesch  [facility]: Dr. Haroldine Laws   Reason for Visit: F/u for Chronic Systolic Heart Failure   HPI  Mr Guidice is a 60 yo male with history of chronic systolic heart failure due to NICM, uncontrolled DMII, GERD, HTN, MI 2015, memory issues, and hyperlipidemia. Of note he was homeless for 6 years but was able to get housing in February of 2020.   Admitted 08/2017 with CP. Underwent LHC/RHC with mild nonobstructive CAD & EF 35-40%.  Readmitted in 10/2017 with chest pain. CTA was negative for PE. He was also admitted in 03/2018 with chest pain. He had a brief episode of A fib and was started on eliquis and carvedilol was increased to 6.25 mg twice a day.  In the past he was on Entresto but this was later stopped due to dizziness. Placed back on lisinopril.   Had sleep study 07/2018 that was normal.   Repeat 2D Echo 01/2019 that showed EF 35-40%, mild LVH, normal RV size and function. No significant valvular dysfunction.   06/2019 Admitted for Covid 19 infection. Was placed on BiPAP but did not require intubation.  Since Covid, he has continued w/ persistent fatigue and persistent dyspnea. Followed by Paramedicine once a week.   Seen by Dr. Angelena Form 08/05/19. Had EKG that showed NSR. No afib. RRR on exam today. He remains on Eliquis for PAF.  I saw him in 3/21 for first time and was very depressed/stressed out and was having a lot of fatigue and SOB. R/L cath in 3/21 with non-obstructive CAD (LAD 68m 60d, LCX 40% diffuse, RCA 30p, 530m RHC well compensated PCWP 8 CI 4.3  Seen in ED 06/08/20 for CP for 4 days and rash. CXR without infiltrates or pulmonary edema, hs-Tn 13-->12, labs unremarkable except elevated creatinine 1.42, EKG stable. CP symptoms resolved and thought to be stress-related. Sent home with prednisone for rash. Has seen PCP since ED visit.  Here for routine f/u  with Paramedicine (KJoellen [place] Has felt poorly for the last week. Complains of wt gain, abdominal distention, discomfort, orthopnea and PND during sleep but no exertional dyspnea. BP elevated 170/100. Has HA. Reports full med compliance. Admits to dietary indiscretion. He has not been troubled recently w/ low BP or orthostatic symptoms. EKG shows NSR, HR 83 bpm. ReDs Clip elevated at 42%.     Studies Echo 09/27/19 EF 40-45% RV ok.   ECHO 12/2017  EF 35-40% Grade II DD. Moderate global reduction in LV systolic function; moderate   diastolic dysfunction.  ECHO 09/2017  EF 20-25% Grade IDD  R/L cath in 3/21: with non-obstructive CAD (LAD 4026m0d, LCX 40% diffuse, RCA 30p, 53m26mHC well compensated PCWP 8 CI 4.3  LHC/RHC 08/2017  RA 5  PA 22/11 (15)  PCWP 9  CO 6 CI 3/14   Prox RCA to Mid RCA lesion is 30% stenosed.  Mid RCA to Dist RCA lesion is 30% stenosed.  Prox Cx to Dist Cx lesion is 20% stenosed.  Mid LAD lesion is 30% stenosed.  There is mild to moderate left ventricular systolic dysfunction.  LV end diastolic pressure is mildly elevated.  The left ventricular ejection fraction is 35-45% by visual estimate.  There is no mitral valve regurgitation.    SH: Disabled for 6 years due to diabetes. He does not smoke. Denies cocaine abuse.  Past Medical History:  Diagnosis Date  . Adenoidal  hypertrophy   . Adenomatous colon polyps 2012  . AKI (acute kidney injury) ([place]) 06/17/2019  . Arthritis   . COVID-19 virus detected 06/18/2019  . Diabetes mellitus   . GERD (gastroesophageal reflux disease)   . Heart attack ([place])    While living in [place]  [name] Hx of adenomatous colonic polyps 08/18/2017  . Hyperlipidemia   . Hypertension   . Mild CAD    a. cath in 08/2017 showing mild nonobstructive CAD with scattered 20-30% stenosis.   . Nonischemic cardiomyopathy ([place])    a. EF 20-25% by echo in 09/2017 with cath showing mild CAD. b.  Last echo 12/2017 EF 35-40%, grade 2 DD.  [name]  Obesity   . PAF (paroxysmal atrial fibrillation) ([place])   . Sleep apnea    cpap, pt says no longer has    Current Outpatient Medications  Medication Sig Dispense Refill  . albuterol (VENTOLIN HFA) 108 (90 Base) MCG/ACT inhaler Inhale 2 puffs into the lungs every 6 (six) hours as needed for wheezing or shortness of breath. 6.7 g 0  . amitriptyline (ELAVIL) 10 MG tablet Take 2 tablets at bed time 180 tablet 3  . atorvastatin (LIPITOR) 80 MG tablet Take 1 tablet (80 mg total) by mouth daily. (Patient taking differently: Take 80 mg by mouth every evening.) 90 tablet 3  . carvedilol (COREG) 6.25 MG tablet Take 1 tablet (6.25 mg total) by mouth 2 (two) times daily with a meal. 180 tablet 3  . dapagliflozin propanediol (FARXIGA) 10 MG TABS tablet Take 1 tablet (10 mg total) by mouth daily before breakfast. 90 tablet 3  . donepezil (ARICEPT) 10 MG tablet Take 1 tablet (10 mg total) by mouth at bedtime. 90 tablet 3  . ELIQUIS 5 MG TABS tablet TAKE 1 TABLET BY MOUTH TWICE DAILY 60 tablet 5  . ezetimibe (ZETIA) 10 MG tablet Take 1 tablet (10 mg total) by mouth daily. 90 tablet 2  . insulin degludec (TRESIBA FLEXTOUCH) 200 UNIT/ML FlexTouch Pen Inject 48 Units into the skin daily. 18 mL 3  . Insulin Syringe-Needle U-100 (INSULIN SYRINGE .3CC/29GX1/2") 29G X 1/2" 0.3 ML MISC Inject 1 Syringe 3 (three) times daily as directed. Check blood sugar three times daily. Dx: E11.9 (Patient taking differently: Inject 1 Syringe as directed See admin instructions. Check blood sugar three times daily. Dx: E11.9) 100 each 3  . Lancets (ONETOUCH DELICA PLUS SWHQPR91M) MISC USE TWICE DAILY 200 each 3  . lidocaine (LMX) 4 % cream Apply 1 application topically 3 (three) times daily as needed. 30 g 0  . lisinopril (ZESTRIL) 20 MG tablet Take 1 tablet (20 mg total) by mouth at bedtime. 30 tablet 6  . nitroGLYCERIN (NITROSTAT) 0.4 MG SL tablet Place 1 tablet (0.4 mg total) under the tongue every 5 (five) minutes as needed for  chest pain. 30 tablet 1  . ONETOUCH ULTRA test strip USE TWICE DAILY 200 strip 3  . polyethylene glycol (MIRALAX / GLYCOLAX) 17 g packet Take 17 g by mouth 2 (two) times daily. 14 each 0  . predniSONE (DELTASONE) 20 MG tablet Take 1 tablet (20 mg total) by mouth 2 (two) times daily. 10 tablet 0  . REPATHA 140 MG/ML SOSY INJECT 1 PEN INTO THE SKIN EVERY 14 DAYS 6 mL 3  . spironolactone (ALDACTONE) 25 MG tablet Take 1 tablet (25 mg total) by mouth daily. 90 tablet 3  . triamcinolone (KENALOG) 0.1 % Apply 1 application topically 3 (three) times daily as needed. For  itching 45 g 1   No current facility-administered medications for this encounter.    No Known Allergies    Social History   Socioeconomic History  . Marital status: Single    Spouse name: Not on file  . Number of children: 0  . Years of education: Not on file  . Highest education level: Not on file  Occupational History  . Occupation: Disability  Tobacco Use  . Smoking status: Never Smoker  . Smokeless tobacco: Never Used  Vaping Use  . Vaping Use: Never used  Substance and Sexual Activity  . Alcohol use: No  . Drug use: No  . Sexual activity: Yes    Birth control/protection: None  Other Topics Concern  . Not on file  Social History Narrative   Social History   Diet?    Do you drink/eat things with caffeine? yes   Marital status?       single      Do you live in a house, apartment, assisted living, condo, trailer, etc.? yes   Is it one or more stories? One story   How many persons live in your home?   Do you have any pets in your home? (please list)    Highest level of education completed? graduate   Do you exercise?            no                          Type & how often?   Advanced Directives   Do you have a living will? no   Do you have a DNR form?                                  If not, do you want to discuss one? no   Do you have signed POA/HPOA for forms? no      Functional Status   Do you have  difficulty bathing or dressing yourself? no   Do you have difficulty preparing food or eating? no   Do you have difficulty managing your medications? no   Do you have difficulty managing your finances? no   Do you have difficulty affording your medications? no   Social Determinants of Health   Financial Resource Strain: Not on file  Food Insecurity: Not on file  Transportation Needs: Not on file  Physical Activity: Not on file  Stress: Not on file  Social Connections: Not on file  Intimate Partner Violence: Not on file      Family History  Problem Relation Age of Onset  . Heart disease Mother   . Heart attack Mother 23  . Hypertension Mother   . Hyperlipidemia Mother   . Diabetes Mother   . Heart disease Father   . Heart attack Father 3  . Hypertension Father   . Hyperlipidemia Father   . Diabetes Father   . Heart attack Sister 63  . Colon cancer Neg Hx   . Stomach cancer Neg Hx   . Esophageal cancer Neg Hx   . Rectal cancer Neg Hx     Vitals:   09/14/20 0858  BP: (!) 170/100  Pulse: 87  SpO2: 99%  Weight: 83.7 kg (184 lb 9.6 oz)   Wt Readings from Last 3 Encounters:  09/14/20 83.7 kg (184 lb 9.6 oz)  09/08/20 76.2 kg (168 lb)  08/11/20 81.6  kg (179 lb 12.8 oz)   ECG: SR 87 bpm (personally reviewed). ReDs Clip: 42%  PHYSICAL EXAM: General:  Well appearing. No respiratory difficulty HEENT: normal Neck: supple. JVD ~10 cm. Carotids 2+ bilat; no bruits. No lymphadenopathy or thyromegaly appreciated. Cor: PMI nondisplaced. Regular rate & rhythm. No rubs, gallops or murmurs. Lungs: clear  Abdomen: soft, nontender, mildly distended. No hepatosplenomegaly. No bruits or masses. Good bowel sounds. Extremities: no cyanosis, clubbing, rash, edema. Warm extremities  Neuro: alert & oriented x 3, cranial nerves grossly intact. moves all 4 extremities w/o difficulty. Affect pleasant.     ASSESSMENT & PLAN: 1. Acute on Chronic Systolic /Diastolic Heart Failure,  NICM - ECHO 12/2017 EF 35-40%. LHC 2/219 with nonobstructive CAD.  - ECHO 01/2019, EF 35-40% - Echo 09/27/19 EF 40-45% RV ok  - R/L cath in 3/21 with non-obstructive CAD (LAD 22m 60d, LCX 40% diffuse, RCA 30p, 542m RHC well compensated PCWP 8 CI 4.3 - Fluid overloaded on exam but remains NYHA Class II. ReDs Clip 42%. Hypertensive - Add Lasix 40 mg daily + KCl 10 mg daily. Check CMP today. F/u BMP in 7 days - [facility] but no further issues w/ hypotension per paramedicine). Stop Lisinopril and Start Losartan 50 mg daily. [facility]  - Continue Farxiga 10 mg daily.  - Continue carvedilol 6.25 mg bid. - Continue spironolactone 25 mg daily.  - Consider Bidil in future if BP allows (will try adding Entresto first) - Discussed low sodium diet and fluid restriction - F/u in 2 weeks to reassess volume status   2. HTN - Elevated, 170/100s. Has not yet taken lisinopril. Also fluid overloaded - Stop Lisinopril>>Start Losartan 50 mg daily w/ Plans to transition to Entresto next visit - Add Lasix 40 mg daily  - Continue spiro and Coreg  - CMP today. F/u BMP in 7 days    3. Type 2 diabetes mellitus - hgb A1C 8.4 2/22 - Management per Dr. ElLoanne Drilling - Continue Farxiga.  4. CAD:  - mild nonobstructive CAD by cath 08/2017 & 3/21 - no ASA w/ Eliquis.  - continue  blocker. - on statin, repatha and zetia -> lipids managed by PCP  5. PAF - NSR on EKG today, HR controlled w/  blocker - Eliquis for a/c. Denies abnormal bleeding. Check CBC today    F/u w/ APP in 2 weeks   BrLyda JesterPA-C  09/14/20 9:19 AM

## 2020-09-14 NOTE — Progress Notes (Signed)
Paramedicine Encounter   Patient ID: [name] , male,   DOB: 1960/12/01,59 y.o.,  MRN: [id number]   Met patient in clinic today with provider.   Pt reports not feeling well, he still has cough. He never did get to the urgent care to be evaluated.  Pt reports he just feels tired, light headed, h/a.  He did take his meds this morning.  He did take meds last night.  He had felt better yesterday but then this morning it started back up. Pt denies any fevers. Appetite is up and down.  He had p/u his meds from pharmacy and placed it in pill box.  He states his stomach feels bubbly, has some GI upset past 2-3 days. Nausea at times, but having diarrhea past few days.  Losartan 63m will be added today and to stop the lisinopril.  brittiany is adding 457mlasix.  Potassium will be added to 104m  Labs today and labs needed to be repeated in a week.  Laying flat he begins to cough.   Reds clip-42% CBG PTA-275  He reports he will go to urgent care or ER once he is done here.   KatMarylouise StacksMTRoosevelt Gardens14/2022

## 2020-09-14 NOTE — Patient Instructions (Signed)
START Lasix 40 mg, one tba daily START Potassium 10 meq one tab daily START Losartan 50 mg, one tab daily STOP Losartan  Labs today We will only contact you if something comes back abnormal or we need to make some changes. Otherwise no news is good news!  Lab needed in 7-10 days  Your physician recommends that you schedule a follow-up appointment in: 2 week  in the Advanced Practitioners (PA/NP) Clinic    Do the following things EVERYDAY: 1) Weigh yourself in the morning before breakfast. Write it down and keep it in a log. 2) Take your medicines as prescribed 3) Eat low salt foods--Limit salt (sodium) to 2000 mg per day.  4) Stay as active as you can everyday 5) Limit all fluids for the day to less than 2 liters  At the [facility], you and your health needs are our priority. As part of our continuing mission to provide you with exceptional heart care, we have created designated Provider Care Teams. These Care Teams include your primary Cardiologist (physician) and Advanced Practice Providers (APPs- Physician Assistants and Nurse Practitioners) who all work together to provide you with the care you need, when you need it.   You may see any of the following providers on your designated Care Team at your next follow up: [name] Dr Glori Bickers . Dr Loralie Champagne . Dr Vickki Muff . Darrick Grinder, NP . Lyda Jester, [place] . Audry Riles, PharmD   Please be sure to bring in all your medications bottles to every appointment.   If you have any questions or concerns before your next appointment please send [place] a message through [place] or call our office at [phone].    TO LEAVE A MESSAGE FOR THE NURSE SELECT OPTION 2, PLEASE LEAVE A MESSAGE INCLUDING: . YOUR NAME . DATE OF BIRTH . CALL BACK NUMBER . REASON FOR CALL**this is important as we prioritize the call backs  YOU WILL RECEIVE A CALL BACK THE SAME DAY AS LONG AS YOU CALL BEFORE 4:00  PM Please see our updated No Show and Same Day Appointment Cancellation Policy attached to your AVS.

## 2020-09-14 NOTE — Telephone Encounter (Signed)
CSW called pt to discuss opportunity to work with [employer] Intern regarding health goals.  Pt is agreeable to working with intern and hopeful to improve dietary habits- has issues with high sodium intake and eating diabetic friendly options.  [place] Intern to follow up with pt within 1-2 weeks  Will continue to follow and assist as needed  Jorge Ny, [facility] Desk#: [phone] Cell#: [phone]

## 2020-09-15 ENCOUNTER — Other Ambulatory Visit (HOSPITAL_COMMUNITY): Payer: Self-pay

## 2020-09-15 ENCOUNTER — Encounter (INDEPENDENT_AMBULATORY_CARE_PROVIDER_SITE_OTHER): Payer: Medicare HMO | Admitting: Ophthalmology

## 2020-09-17 NOTE — Progress Notes (Signed)
[facility] Note  09/21/2020     CHIEF COMPLAINT Patient presents for Retina Follow Up   HISTORY OF PRESENT ILLNESS: Calvin [name] is a 60 y.o. male who presents to the clinic today for:  HPI    Retina Follow Up    Patient presents with  Diabetic Retinopathy.  In both eyes.  This started months ago.  Severity is moderate.  Duration of 4 weeks.  Since onset it is gradually improving.  I, the attending physician,  performed the HPI with the patient and updated documentation appropriately.          Comments    60 y/o male pt here for 4 wk f/u for severe NPDR w/DME OU.  No change in [place] OU noticed.  Denies pain, FOL, floaters.  No gtts.  BS unknown.  A1C 8.4 2.8.22.       Last edited by [name], MD on 09/21/2020  8:44 AM. (History)    pt    Referring physician: [name], NP [place],  [place] [address]  HISTORICAL INFORMATION:   Selected notes from the MEDICAL RECORD NUMBER Referred by Dr. [name] originally Re-referred by Dr. Darcey Nora ([facility]) for DM f/u Uncontrolled DM II on insulin A1C: 10.1   CURRENT MEDICATIONS: No current outpatient medications on file. (Ophthalmic Drugs)   No current facility-administered medications for this visit. (Ophthalmic Drugs)   Current Outpatient Medications (Other)  Medication Sig  . albuterol (VENTOLIN HFA) 108 (90 Base) MCG/ACT inhaler Inhale 2 puffs into the lungs every 6 (six) hours as needed for wheezing or shortness of breath.  [name] amitriptyline (ELAVIL) 10 MG tablet Take 2 tablets at bed time  . atorvastatin (LIPITOR) 80 MG tablet Take 1 tablet (80 mg total) by mouth daily. (Patient taking differently: Take 80 mg by mouth every evening.)  . carvedilol (COREG) 6.25 MG tablet Take 1 tablet (6.25 mg total) by mouth 2 (two) times daily with a meal.  . dapagliflozin propanediol (FARXIGA) 10 MG TABS tablet Take 1 tablet (10 mg total) by mouth daily before breakfast.  .  donepezil (ARICEPT) 10 MG tablet Take 1 tablet (10 mg total) by mouth at bedtime.  [name] ELIQUIS 5 MG TABS tablet TAKE 1 TABLET BY MOUTH TWICE DAILY  . ezetimibe (ZETIA) 10 MG tablet Take 1 tablet (10 mg total) by mouth daily.  . furosemide (LASIX) 40 MG tablet Take 1 tablet (40 mg total) by mouth daily.  . insulin degludec (TRESIBA FLEXTOUCH) 200 UNIT/ML FlexTouch Pen Inject 48 Units into the skin daily.  . Insulin Syringe-Needle U-100 (INSULIN SYRINGE .3CC/29GX1/2") 29G X 1/2" 0.3 ML MISC Inject 1 Syringe 3 (three) times daily as directed. Check blood sugar three times daily. Dx: E11.9 (Patient taking differently: Inject 1 Syringe as directed See admin instructions. Check blood sugar three times daily. Dx: E11.9)  . Lancets (ONETOUCH DELICA PLUS [id number]) MISC USE TWICE DAILY  . lidocaine (LMX) 4 % cream Apply 1 application topically 3 (three) times daily as needed.  [name] losartan (COZAAR) 50 MG tablet Take 1 tablet (50 mg total) by mouth daily.  . nitroGLYCERIN (NITROSTAT) 0.4 MG SL tablet Place 1 tablet (0.4 mg total) under the tongue every 5 (five) minutes as needed for chest pain.  [name] ULTRA test strip USE TWICE DAILY  . polyethylene glycol (MIRALAX / GLYCOLAX) 17 g packet Take 17 g by mouth 2 (two) times daily.  . potassium chloride SA (KLOR-CON)  10 MEQ tablet Take 1 tablet (10 mEq total) by mouth daily.  . predniSONE (DELTASONE) 20 MG tablet Take 1 tablet (20 mg total) by mouth 2 (two) times daily.  [name] REPATHA 140 MG/ML SOSY INJECT 1 PEN INTO THE SKIN EVERY 14 DAYS  . spironolactone (ALDACTONE) 25 MG tablet Take 1 tablet (25 mg total) by mouth daily.  [name] triamcinolone (KENALOG) 0.1 % Apply 1 application topically 3 (three) times daily as needed. For itching   No current facility-administered medications for this visit. (Other)      REVIEW OF SYSTEMS: ROS    Positive for: Gastrointestinal, Neurological, Musculoskeletal, Endocrine, Cardiovascular, Eyes, Respiratory   Negative for:  Constitutional, Skin, Genitourinary, HENT, Psychiatric, Allergic/Imm, Heme/Lymph   Last edited by Matthew Folks, COA on 09/21/2020  8:14 AM. (History)       ALLERGIES No Known Allergies  PAST MEDICAL HISTORY Past Medical History:  Diagnosis Date  . Adenoidal hypertrophy   . Adenomatous colon polyps 2012  . AKI (acute kidney injury) ([place]) 06/17/2019  . Arthritis   . Cataract    Mixed OU  . COVID-19 virus detected 06/18/2019  . Diabetes mellitus   . Diabetic retinopathy ([place])    NPDR OU  . GERD (gastroesophageal reflux disease)   . Heart attack ([place])    While living in [place]  [name] Hx of adenomatous colonic polyps 08/18/2017  . Hyperlipidemia   . Hypertension   . Hypertensive retinopathy    OU  . Mild CAD    a. cath in 08/2017 showing mild nonobstructive CAD with scattered 20-30% stenosis.   . Nonischemic cardiomyopathy ([place])    a. EF 20-25% by echo in 09/2017 with cath showing mild CAD. b.  Last echo 12/2017 EF 35-40%, grade 2 DD.  [name] Obesity   . PAF (paroxysmal atrial fibrillation) ([place])   . Sleep apnea    cpap, pt says no longer has   Past Surgical History:  Procedure Laterality Date  . COLONOSCOPY  05/13/11   9 adenomas  . FOREARM SURGERY    . MUSCLE BIOPSY    . RIGHT/LEFT HEART CATH AND CORONARY ANGIOGRAPHY N/A 08/25/2017   Procedure: RIGHT/LEFT HEART CATH AND CORONARY ANGIOGRAPHY;  Surgeon: [name], MD;  Location: [facility];  Service: Cardiovascular;  Laterality: N/A;  . RIGHT/LEFT HEART CATH AND CORONARY ANGIOGRAPHY N/A 10/01/2019   Procedure: RIGHT/LEFT HEART CATH AND CORONARY ANGIOGRAPHY;  Surgeon: [name], MD;  Location: [facility];  Service: Cardiovascular;  Laterality: N/A;    FAMILY HISTORY Family History  Problem Relation Age of Onset  . Heart disease Mother   . Heart attack Mother 1  . Hypertension Mother   . Hyperlipidemia Mother   . Diabetes Mother   . Heart disease Father   . Heart attack Father 51  .  Hypertension Father   . Hyperlipidemia Father   . Diabetes Father   . Heart attack Sister 27  . Colon cancer Neg Hx   . Stomach cancer Neg Hx   . Esophageal cancer Neg Hx   . Rectal cancer Neg Hx     SOCIAL HISTORY Social History   Tobacco Use  . Smoking status: Never Smoker  . Smokeless tobacco: Never Used  Vaping Use  . Vaping Use: Never used  Substance Use Topics  . Alcohol use: No  . Drug use: No         OPHTHALMIC EXAM:  Base Eye Exam    Visual Acuity (Snellen -  Linear)      Right Left   Dist [place] 20/30 20/40   Dist ph [place] NI NI       Tonometry (Tonopen, 8:17 AM)      Right Left   Pressure 17 19       Pupils      Dark Light Shape React APD   Right 3 2 Round Brisk None   Left 3 2 Round Brisk None       Visual Fields (Counting fingers)      Left Right    Full Full       Extraocular Movement      Right Left    Full, Ortho Full, Ortho       Neuro/Psych    Oriented x3: Yes   Mood/Affect: Normal       Dilation    Both eyes: 1.0% Mydriacyl, 2.5% Phenylephrine @ 8:17 AM        Slit Lamp and Fundus Exam    Slit Lamp Exam      Right Left   Lids/Lashes Dermatochalasis - upper lid, Meibomian gland dysfunction Dermatochalasis - upper lid, Meibomian gland dysfunction   Conjunctiva/Sclera nasal and temporal pinguecula, Melanosis nasal and temporal pinguecula, Melanosis   Cornea arcus, 2-3+ Punctate epithelial erosions, tear film debris arcus, 2+ inferior Punctate epithelial erosions   Anterior Chamber deep, clear, narrow temporal angle Deep and quiet   Iris Round and dilated, No NVI Round and dilated, No NVI   Lens 2-3+ Nuclear sclerosis, 2-3+ Cortical cataract 2-3+ Nuclear sclerosis, 2-3+ Cortical cataract   Vitreous Vitreous syneresis Vitreous syneresis       Fundus Exam      Right Left   Disc Pink and Sharp, no NVD Pink and Sharp, no NVD, Compact   C/D Ratio 0.3 0.2   Macula Blunted foveal reflex, mild improvement in central edema, scattered  MA/IRH -- improving, extensive exudates - persistent Blunted foveal reflex, central edema with severe exudates -- mild interval improvement, scattered IRH   Vessels attenuated, Tortuous, ST venule dilated attenuated, Tortuous   Periphery Attached, scattered IRH/DBH, exudates greatest posteriorly and nasal to disc Attached, scattered IRH/DBH, exudates greatest posteriorly             IMAGING AND PROCEDURES  Imaging and Procedures for 09/21/2020  OCT, Retina - OU - Both Eyes       Right Eye Quality was good. Central Foveal Thickness: 319. Progression has improved. Findings include abnormal foveal contour, intraretinal fluid, intraretinal hyper-reflective material, subretinal hyper-reflective material, vitreomacular adhesion , no SRF (Mild Interval improvement in temporal IRF).   Left Eye Quality was good. Central Foveal Thickness: 308. Progression has improved. Findings include abnormal foveal contour, intraretinal fluid, intraretinal hyper-reflective material, no SRF (Interval improvement in IRF / IRHM).   Notes *Images captured and stored on drive  Diagnosis / Impression:  Central DME OU (OS > OD) OD: Mild Interval improvement in temporal IRF OS: Interval improvement in IRF / IRHM  Clinical management:  See below  Abbreviations: NFP - Normal foveal profile. CME - cystoid macular edema. PED - pigment epithelial detachment. IRF - intraretinal fluid. SRF - subretinal fluid. EZ - ellipsoid zone. ERM - epiretinal membrane. ORA - outer retinal atrophy. ORT - outer retinal tubulation. SRHM - subretinal hyper-reflective material. IRHM - intraretinal hyper-reflective material        Intravitreal Injection, Pharmacologic Agent - OD - Right Eye       Time Out 09/21/2020. 8:31 AM. Confirmed correct patient, procedure,  site, and patient consented.   Anesthesia Topical anesthesia was used. Anesthetic medications included Lidocaine 2%, Proparacaine 0.5%.   Procedure Preparation  included 5% betadine to ocular surface, eyelid speculum. A supplied needle was used.   Injection:  1.25 mg Bevacizumab (AVASTIN) 1.74m/0.05mL SOLN   NDC: 503500-938-18 Lot:: [id number] Expiration date: 11/07/2020   Route: Intravitreal, Site: Right Eye, Waste: 0 mL  Post-op Post injection exam found visual acuity of at least counting fingers. The patient tolerated the procedure well. There were no complications. The patient received written and verbal post procedure care education. Post injection medications were not given.        Intravitreal Injection, Pharmacologic Agent - OS - Left Eye       Time Out 09/21/2020. 8:31 AM. Confirmed correct patient, procedure, site, and patient consented.   Anesthesia Topical anesthesia was used. Anesthetic medications included Lidocaine 2%, Proparacaine 0.5%.   Procedure Preparation included 5% betadine to ocular surface, eyelid speculum. A (32g) needle was used.   Injection:  1.25 mg Bevacizumab (AVASTIN) 1.245m0.05mL SOLN   NDC: 5096789-381-01Lot: [id number], Expiration date: 11/10/2020   Route: Intravitreal, Site: Left Eye, Waste: 0.05 mL  Post-op Post injection exam found visual acuity of at least counting fingers. The patient tolerated the procedure well. There were no complications. The patient received written and verbal post procedure care education. Post injection medications were not given.                 ASSESSMENT/PLAN:    ICD-10-CM   1. Severe nonproliferative diabetic retinopathy of both eyes with macular edema associated with type 2 diabetes mellitus (HCC)  E1B51.0258ntravitreal Injection, Pharmacologic Agent - OD - Right Eye    Intravitreal Injection, Pharmacologic Agent - OS - Left Eye    Bevacizumab (AVASTIN) SOLN 1.25 mg    Bevacizumab (AVASTIN) SOLN 1.25 mg  2. Retinal edema  H35.81 OCT, Retina - OU - Both Eyes  3. Essential hypertension  I10   4. Hypertensive retinopathy of both eyes  H35.033   5. Combined forms of  age-related cataract of both eyes  H25.813     1,2. Severe Non-proliferative diabetic retinopathy w/ DME, OU  - pt is delayed from 4 weeks to 9 weeks due to financial / insurance reasons  - A1c 8.4% on 02.08.22 -- steady improvement since July 2021 (10.1%)  - s/p IVA OS #1 (09.16.21), IVA OD #1 (09.21.21)             - s/p IVA OU #2 (10.19.21), #3 (11.16.21), #4 (12.14.21), #5 (02.21.22) - exam shows central edema OU, scattered IRH and severe exudates OU -- improving - FA (09.16.21) shows late leaking MA greatest posterior pole; no NV; +peripheral vascular perfusion defects -- may benefit from peripheral PRP OU  - OCT shows OD: Mild Interval improvement in temporal IRF; OS: Interval improvement in IRF / IRHM - recommend IVA OU #6 today, 03.21.22  - Avastin informed consent obtained, signed and scanned OD, 09.21.21 - Avastin informed consent obtained, signed and scanned, 09.16.21 (OS) - f/u 4 weeks, DFE, OCT, possible injection(s)  3,4. Hypertensive retinopathy OU - discussed importance of tight BP control - monitor  5. Mixed Cataract OU - The symptoms of cataract, surgical options, and treatments and risks were discussed with patient. - discussed diagnosis and progression - visually significant -- will refer for cat eval once DR more stable   Ophthalmic Meds Ordered this visit:  Meds ordered this encounter  Medications  . Bevacizumab (AVASTIN) SOLN  1.25 mg  . Bevacizumab (AVASTIN) SOLN 1.25 mg       Return in about 4 weeks (around 10/19/2020) for f/u NPDR OU, DFE, OCT.  There are no Patient Instructions on file for this visit.  This document serves as a record of services personally performed by Gardiner Sleeper, MD, PhD. It was created on their behalf by Leeann Must, [place], an ophthalmic technician. The creation of this record is the provider's dictation and/or activities during the visit.    Electronically signed by: Leeann Must, [place] 03.17.2022 9:04 AM   This document  serves as a record of services personally performed by Gardiner Sleeper, MD, PhD. It was created on their behalf by San Jetty. Owens Shark, OA an ophthalmic technician. The creation of this record is the provider's dictation and/or activities during the visit.    Electronically signed by: San Jetty. Owens Shark, [place] 03.21.2022 9:04 AM  Gardiner Sleeper, M.D., Ph.D. Diseases & Surgery of the Retina and [place] 09/21/2020   I have reviewed the above documentation for accuracy and completeness, and I agree with the above. Gardiner Sleeper, M.D., Ph.D. 09/21/20 9:04 AM   Abbreviations: M myopia (nearsighted); A astigmatism; H hyperopia (farsighted); P presbyopia; Mrx spectacle prescription;  CTL contact lenses; OD right eye; OS left eye; OU both eyes  XT exotropia; ET esotropia; PEK punctate epithelial keratitis; PEE punctate epithelial erosions; DES dry eye syndrome; MGD meibomian gland dysfunction; ATs artificial tears; PFAT's preservative free artificial tears; [place] nuclear sclerotic cataract; PSC posterior subcapsular cataract; ERM epi-retinal membrane; PVD posterior vitreous detachment; RD retinal detachment; DM diabetes mellitus; DR diabetic retinopathy; NPDR non-proliferative diabetic retinopathy; PDR proliferative diabetic retinopathy; CSME clinically significant macular edema; DME diabetic macular edema; dbh dot blot hemorrhages; CWS cotton wool spot; POAG primary open angle glaucoma; C/D cup-to-disc ratio; HVF humphrey visual field; GVF goldmann visual field; OCT optical coherence tomography; IOP intraocular pressure; BRVO Branch retinal vein occlusion; CRVO central retinal vein occlusion; CRAO central retinal artery occlusion; BRAO branch retinal artery occlusion; RT retinal tear; SB scleral buckle; PPV pars plana vitrectomy; VH Vitreous hemorrhage; PRP panretinal laser photocoagulation; IVK intravitreal kenalog; VMT vitreomacular traction; MH Macular hole;  NVD  neovascularization of the disc; NVE neovascularization elsewhere; AREDS age related eye disease study; ARMD age related macular degeneration; POAG primary open angle glaucoma; EBMD epithelial/anterior basement membrane dystrophy; ACIOL anterior chamber intraocular lens; IOL intraocular lens; PCIOL posterior chamber intraocular lens; Phaco/IOL phacoemulsification with intraocular lens placement; [place] photorefractive keratectomy; LASIK laser assisted in situ keratomileusis; HTN hypertension; DM diabetes mellitus; COPD chronic obstructive pulmonary disease

## 2020-09-17 NOTE — Progress Notes (Signed)
Came out for med rec post clinic visit yesteday-he p/u his meds today.  So filled pill boxes up.  Will f/u next week post labs.   Marylouise Stacks, EMT-Paramedic  09/15/2020

## 2020-09-18 ENCOUNTER — Telehealth: Payer: Self-pay

## 2020-09-18 NOTE — Chronic Care Management (AMB) (Addendum)
Per coordination with Marylouise Stacks, EMT, patient requested to onboard at [facility]. Onboard form has been completed and [facility] has been contacted to coordinate patient's medication transfer to [facility] for delivery, med sync, and adherence packaging from [facility].  Follow-Up:  Coordination of Enhanced Pharmacy Services and Pharmacist Review  Debbora Dus, CPP notified  Margaretmary Dys, [place] [phone]  Total time spent for month: 30 non-CCM

## 2020-09-21 ENCOUNTER — Ambulatory Visit (HOSPITAL_COMMUNITY)
Admission: RE | Admit: 2020-09-21 | Discharge: 2020-09-21 | Disposition: A | Discharge status: Home / Self Care | Payer: Medicare HMO | Source: Ambulatory Visit | Attending: Internal Medicine | Admitting: Internal Medicine

## 2020-09-21 ENCOUNTER — Ambulatory Visit (INDEPENDENT_AMBULATORY_CARE_PROVIDER_SITE_OTHER): Payer: Medicare HMO | Admitting: Ophthalmology

## 2020-09-21 ENCOUNTER — Ambulatory Visit (INDEPENDENT_AMBULATORY_CARE_PROVIDER_SITE_OTHER): Payer: Medicare HMO | Admitting: Nurse Practitioner

## 2020-09-21 ENCOUNTER — Other Ambulatory Visit: Payer: Self-pay

## 2020-09-21 ENCOUNTER — Encounter (INDEPENDENT_AMBULATORY_CARE_PROVIDER_SITE_OTHER): Payer: Self-pay | Admitting: Ophthalmology

## 2020-09-21 ENCOUNTER — Encounter: Payer: Self-pay | Admitting: Nurse Practitioner

## 2020-09-21 VITALS — BP 110/80 | HR 87 | Temp 97.5°F | Ht 66.25 in | Wt 182.8 lb

## 2020-09-21 DIAGNOSIS — E113413 Type 2 diabetes mellitus with severe nonproliferative diabetic retinopathy with macular edema, bilateral: Secondary | ICD-10-CM

## 2020-09-21 DIAGNOSIS — I5022 Chronic systolic (congestive) heart failure: Secondary | ICD-10-CM | POA: Diagnosis not present

## 2020-09-21 DIAGNOSIS — H35033 Hypertensive retinopathy, bilateral: Secondary | ICD-10-CM

## 2020-09-21 DIAGNOSIS — E1140 Type 2 diabetes mellitus with diabetic neuropathy, unspecified: Secondary | ICD-10-CM | POA: Diagnosis not present

## 2020-09-21 DIAGNOSIS — I10 Essential (primary) hypertension: Secondary | ICD-10-CM

## 2020-09-21 DIAGNOSIS — E279 Disorder of adrenal gland, unspecified: Secondary | ICD-10-CM

## 2020-09-21 DIAGNOSIS — H25813 Combined forms of age-related cataract, bilateral: Secondary | ICD-10-CM | POA: Diagnosis not present

## 2020-09-21 DIAGNOSIS — I739 Peripheral vascular disease, unspecified: Secondary | ICD-10-CM

## 2020-09-21 DIAGNOSIS — H3581 Retinal edema: Secondary | ICD-10-CM

## 2020-09-21 DIAGNOSIS — Z794 Long term (current) use of insulin: Secondary | ICD-10-CM | POA: Diagnosis not present

## 2020-09-21 DIAGNOSIS — E278 Other specified disorders of adrenal gland: Secondary | ICD-10-CM

## 2020-09-21 DIAGNOSIS — I480 Paroxysmal atrial fibrillation: Secondary | ICD-10-CM | POA: Diagnosis not present

## 2020-09-21 HISTORY — DX: Disorder of adrenal gland, unspecified: E27.9

## 2020-09-21 HISTORY — DX: Other specified disorders of adrenal gland: E27.8

## 2020-09-21 LAB — BASIC METABOLIC PANEL
Anion gap: 8 (ref 5–15)
BUN: 13 mg/dL (ref 6–20)
CO2: 29 mmol/L (ref 22–32)
Calcium: 9.5 mg/dL (ref 8.9–10.3)
Chloride: 103 mmol/L (ref 98–111)
Creatinine, Ser: 1.69 mg/dL — ABNORMAL HIGH (ref 0.61–1.24)
GFR, Estimated: 46 mL/min — ABNORMAL LOW (ref >60)
Glucose, Bld: 169 mg/dL — ABNORMAL HIGH (ref 70–99)
Potassium: 4.7 mmol/L (ref 3.5–5.1)
Sodium: 140 mmol/L (ref 135–145)

## 2020-09-21 MED ORDER — BEVACIZUMAB CHEMO INJECTION 1.25MG/0.05ML SYRINGE FOR KALEIDOSCOPE
1.2500 mg | INTRAVITREAL | Status: AC | PRN
  Administered 2020-09-21: 1.25 mg via INTRAVITREAL

## 2020-09-21 NOTE — Patient Instructions (Addendum)
Limit fluids to 67 ozs a day.  Eat low salt foods--Limit salt (sodium) to 2000 mg per day.  Make sure to also follow diabetic diet.    [email or web address]">  DASH Eating Plan DASH stands for Dietary Approaches to Stop Hypertension. The DASH eating plan is a healthy eating plan that has been shown to:  Reduce high blood pressure (hypertension).  Reduce your risk for type 2 diabetes, heart disease, and stroke.  Help with weight loss. What are tips for following this plan? Reading food labels  Check food labels for the amount of salt (sodium) per serving. Choose foods with less than 5 percent of the Daily Value of sodium. Generally, foods with less than 300 milligrams (mg) of sodium per serving fit into this eating plan.  To find whole grains, look for the word "whole" as the first word in the ingredient list. Shopping  Buy products labeled as "low-sodium" or "no salt added."  Buy fresh foods. Avoid canned foods and pre-made or frozen meals. Cooking  Avoid adding salt when cooking. Use salt-free seasonings or herbs instead of table salt or sea salt. Check with your health care provider or pharmacist before using salt substitutes.  Do not fry foods. Cook foods using healthy methods such as baking, boiling, grilling, roasting, and broiling instead.  Cook with heart-healthy oils, such as olive, canola, avocado, soybean, or sunflower oil. Meal planning  Eat a balanced diet that includes: ? 4 or more servings of fruits and 4 or more servings of vegetables each day. Try to fill one-half of your plate with fruits and vegetables. ? 6-8 servings of whole grains each day. ? Less than 6 oz (170 g) of lean meat, poultry, or fish each day. A 3-oz (85-g) serving of meat is about the same size as a deck of cards. One egg equals 1 oz (28 g). ? 2-3 servings of low-fat dairy each day. One serving is 1 cup (237 mL). ? 1 serving of nuts, seeds, or beans 5  times each week. ? 2-3 servings of heart-healthy fats. Healthy fats called omega-3 fatty acids are found in foods such as walnuts, flaxseeds, fortified milks, and eggs. These fats are also found in cold-water fish, such as sardines, salmon, and mackerel.  Limit how much you eat of: ? Canned or prepackaged foods. ? Food that is high in trans fat, such as some fried foods. ? Food that is high in saturated fat, such as fatty meat. ? Desserts and other sweets, sugary drinks, and other foods with added sugar. ? Full-fat dairy products.  Do not salt foods before eating.  Do not eat more than 4 egg yolks a week.  Try to eat at least 2 vegetarian meals a week.  Eat more home-cooked food and less restaurant, buffet, and fast food.   Lifestyle  When eating at a restaurant, ask that your food be prepared with less salt or no salt, if possible.  If you drink alcohol: ? Limit how much you use to:  0-1 drink a day for women who are not pregnant.  0-2 drinks a day for men. ? Be aware of how much alcohol is in your drink. In the [place] one drink equals one 12 oz bottle of beer (355 mL), one 5 oz glass of wine (148 mL), or one 1 oz glass of hard liquor (44 mL). General information  Avoid eating more than 2,300 mg of salt a day. If you have hypertension, you may need to reduce your sodium  intake to 1,500 mg a day.  Work with your health care provider to maintain a healthy body weight or to lose weight. Ask what an ideal weight is for you.  Get at least 30 minutes of exercise that causes your heart to beat faster (aerobic exercise) most days of the week. Activities may include walking, swimming, or biking.  Work with your health care provider or dietitian to adjust your eating plan to your individual calorie needs. What foods should I eat? Fruits All fresh, dried, or frozen fruit. Canned fruit in natural juice (without added sugar). Vegetables Fresh or frozen vegetables (raw, steamed, roasted,  or grilled). Low-sodium or reduced-sodium tomato and vegetable juice. Low-sodium or reduced-sodium tomato sauce and tomato paste. Low-sodium or reduced-sodium canned vegetables. Grains Whole-grain or whole-wheat bread. Whole-grain or whole-wheat pasta. Brown rice. [place]. Bulgur. Whole-grain and low-sodium cereals. Pita bread. Low-fat, low-sodium crackers. Whole-wheat flour tortillas. Meats and other proteins Skinless chicken or [place]. Ground chicken or [place]. Pork with fat trimmed off. Fish and seafood. Egg whites. Dried beans, peas, or lentils. Unsalted nuts, nut butters, and seeds. Unsalted canned beans. Lean cuts of beef with fat trimmed off. Low-sodium, lean precooked or cured meat, such as sausages or meat loaves. Dairy Low-fat (1%) or fat-free (skim) milk. Reduced-fat, low-fat, or fat-free cheeses. Nonfat, low-sodium ricotta or cottage cheese. Low-fat or nonfat yogurt. Low-fat, low-sodium cheese. Fats and oils Soft margarine without trans fats. Vegetable oil. Reduced-fat, low-fat, or light mayonnaise and salad dressings (reduced-sodium). Canola, safflower, olive, avocado, soybean, and sunflower oils. Avocado. Seasonings and condiments Herbs. Spices. Seasoning mixes without salt. Other foods Unsalted popcorn and pretzels. Fat-free sweets. The items listed above may not be a complete list of foods and beverages you can eat. Contact a dietitian for more information. What foods should I avoid? Fruits Canned fruit in a light or heavy syrup. Fried fruit. Fruit in cream or butter sauce. Vegetables Creamed or fried vegetables. Vegetables in a cheese sauce. Regular canned vegetables (not low-sodium or reduced-sodium). Regular canned tomato sauce and paste (not low-sodium or reduced-sodium). Regular tomato and vegetable juice (not low-sodium or reduced-sodium). Angie Fava. Olives. Grains Baked goods made with fat, such as croissants, muffins, or some breads. Dry pasta or rice meal  packs. Meats and other proteins Fatty cuts of meat. Ribs. Fried meat. Berniece Salines. Bologna, salami, and other precooked or cured meats, such as sausages or meat loaves. Fat from the back of a pig (fatback). Bratwurst. Salted nuts and seeds. Canned beans with added salt. Canned or smoked fish. Whole eggs or egg yolks. Chicken or [place] with skin. Dairy Whole or 2% milk, cream, and half-and-half. Whole or full-fat cream cheese. Whole-fat or sweetened yogurt. Full-fat cheese. Nondairy creamers. Whipped toppings. Processed cheese and cheese spreads. Fats and oils Butter. Stick margarine. Lard. Shortening. Ghee. Bacon fat. Tropical oils, such as coconut, palm kernel, or palm oil. Seasonings and condiments Onion salt, garlic salt, seasoned salt, table salt, and sea salt. Worcestershire sauce. Tartar sauce. Barbecue sauce. Teriyaki sauce. Soy sauce, including reduced-sodium. Steak sauce. Canned and packaged gravies. Fish sauce. Oyster sauce. Cocktail sauce. Store-bought horseradish. Ketchup. Mustard. Meat flavorings and tenderizers. Bouillon cubes. Hot sauces. Pre-made or packaged marinades. Pre-made or packaged taco seasonings. Relishes. Regular salad dressings. Other foods Salted popcorn and pretzels. The items listed above may not be a complete list of foods and beverages you should avoid. Contact a dietitian for more information. Where to find more information  National Heart, Lung, and Blood Institute: https://wilson-eaton.com/  American Heart  Association: www.heart.org  Academy of Nutrition and Dietetics: www.eatright.[place]: www.kidney.org Summary  The DASH eating plan is a healthy eating plan that has been shown to reduce high blood pressure (hypertension). It may also reduce your risk for type 2 diabetes, heart disease, and stroke.  When on the DASH eating plan, aim to eat more fresh fruits and vegetables, whole grains, lean proteins, low-fat dairy, and heart-healthy  fats.  With the DASH eating plan, you should limit salt (sodium) intake to 2,300 mg a day. If you have hypertension, you may need to reduce your sodium intake to 1,500 mg a day.  Work with your health care provider or dietitian to adjust your eating plan to your individual calorie needs. This information is not intended to replace advice given to you by your health care provider. Make sure you discuss any questions you have with your health care provider. Document Revised: 05/24/2019 Document Reviewed: 05/24/2019 Elsevier Patient Education  2021 [employer].

## 2020-09-21 NOTE — Progress Notes (Signed)
Careteam: [name], NP as PCP - General (Geriatric Medicine) [name], MD as PCP - Cardiology (Cardiology) [name], [name], MD as PCP - Advanced Heart Failure (Cardiology) [name], MD as Consulting Physician (Endocrinology) Jon Billings, RN as [place]:  [place] Directive information Does Patient Have a Medical Advance Directive?: No, Would patient like information on creating a medical advance directive?: Yes (MAU/Ambulatory/Procedural Areas - Information given)  No Known Allergies  Chief Complaint  Patient presents with  . Medical Management of Chronic Issues    4 month follow up.Discuss need for Colonoscopy,eye exam and COVID booster. Patient states that he wenn to heart doctor and was told that he has a lot fluid build up.     HPI: Patient is a 60 y.o. male for routine follow up.   Acute CHF- lasix was added, nurse changed lisinopril to losartan.  Reports legs swelling up with cramps. Reports swelling has improved. Weight is about the same. No worsening of shortness of breath at this time but no improvement.   DM- has high in the 300s.   Had [place], bacon sandwich on wheat, used egg and cheese for breakfast.  Had 2 pieces of hamburger pizza, had water for dinner  Rice, peas and chicken (airfried)   Memory loss- ongoing, has help with community outreach, family and   Hyperlipidemia- continues on lipitor 80 mg daily   Review of Systems:  Review of Systems  Constitutional: Negative for chills, fever and weight loss.  HENT: Negative for tinnitus.   Respiratory: Positive for shortness of breath. Negative for cough and sputum production.   Cardiovascular: Negative for chest pain, palpitations and leg swelling.  Gastrointestinal: Negative for abdominal pain, constipation, diarrhea and heartburn.  Genitourinary: Negative for dysuria, frequency and  urgency.  Musculoskeletal: Negative for back pain, falls, joint pain and myalgias.  Skin: Negative.   Neurological: Negative for dizziness and headaches.  Psychiatric/Behavioral: Negative for depression and memory loss. The patient does not have insomnia.     Past Medical History:  Diagnosis Date  . Adenoidal hypertrophy   . Adenomatous colon polyps 2012  . AKI (acute kidney injury) ([place]) 06/17/2019  . Arthritis   . Cataract    Mixed OU  . COVID-19 virus detected 06/18/2019  . Diabetes mellitus   . Diabetic retinopathy ([place])    NPDR OU  . GERD (gastroesophageal reflux disease)   . Heart attack ([place])    While living in [place]  [name] Hx of adenomatous colonic polyps 08/18/2017  . Hyperlipidemia   . Hypertension   . Hypertensive retinopathy    OU  . Mild CAD    a. cath in 08/2017 showing mild nonobstructive CAD with scattered 20-30% stenosis.   . Nonischemic cardiomyopathy ([place])    a. EF 20-25% by echo in 09/2017 with cath showing mild CAD. b.  Last echo 12/2017 EF 35-40%, grade 2 DD.  [name] Obesity   . PAF (paroxysmal atrial fibrillation) ([address])   . Sleep apnea    cpap, pt says no longer has   Past Surgical History:  Procedure Laterality Date  . COLONOSCOPY  05/13/11   9 adenomas  . FOREARM SURGERY    . MUSCLE BIOPSY    . RIGHT/LEFT HEART CATH AND CORONARY ANGIOGRAPHY N/A 08/25/2017   Procedure: RIGHT/LEFT HEART CATH AND CORONARY ANGIOGRAPHY;  Surgeon: [name], MD;  Location: [facility];  Service: Cardiovascular;  Laterality: N/A;  . RIGHT/LEFT HEART CATH AND CORONARY ANGIOGRAPHY N/A 10/01/2019   Procedure: RIGHT/LEFT HEART CATH AND CORONARY ANGIOGRAPHY;  Surgeon: [name], MD;  Location: [facility];  Service: Cardiovascular;  Laterality: N/A;   Social History:   reports that he has never smoked. He has never used smokeless tobacco. He reports that he does not drink alcohol and does not use drugs.  Family History  Problem Relation Age of  Onset  . Heart disease Mother   . Heart attack Mother 51  . Hypertension Mother   . Hyperlipidemia Mother   . Diabetes Mother   . Heart disease Father   . Heart attack Father 36  . Hypertension Father   . Hyperlipidemia Father   . Diabetes Father   . Heart attack Sister 49  . Colon cancer Neg Hx   . Stomach cancer Neg Hx   . Esophageal cancer Neg Hx   . Rectal cancer Neg Hx     Medications: Patient's Medications  New Prescriptions   No medications on file  Previous Medications   ALBUTEROL (VENTOLIN HFA) 108 (90 BASE) MCG/ACT INHALER    Inhale 2 puffs into the lungs every 6 (six) hours as needed for wheezing or shortness of breath.   AMITRIPTYLINE (ELAVIL) 10 MG TABLET    Take 2 tablets at bed time   ATORVASTATIN (LIPITOR) 80 MG TABLET    Take 1 tablet (80 mg total) by mouth daily.   CARVEDILOL (COREG) 6.25 MG TABLET    Take 1 tablet (6.25 mg total) by mouth 2 (two) times daily with a meal.   DAPAGLIFLOZIN PROPANEDIOL (FARXIGA) 10 MG TABS TABLET    Take 1 tablet (10 mg total) by mouth daily before breakfast.   DONEPEZIL (ARICEPT) 10 MG TABLET    Take 1 tablet (10 mg total) by mouth at bedtime.   ELIQUIS 5 MG TABS TABLET    TAKE 1 TABLET BY MOUTH TWICE DAILY   EZETIMIBE (ZETIA) 10 MG TABLET    Take 1 tablet (10 mg total) by mouth daily.   FUROSEMIDE (LASIX) 40 MG TABLET    Take 1 tablet (40 mg total) by mouth daily.   INSULIN DEGLUDEC (TRESIBA FLEXTOUCH) 200 UNIT/ML FLEXTOUCH PEN    Inject 48 Units into the skin daily.   INSULIN SYRINGE-NEEDLE U-100 (INSULIN SYRINGE .3CC/29GX1/2") 29G X 1/2" 0.3 ML MISC    Inject 1 Syringe 3 (three) times daily as directed. Check blood sugar three times daily. Dx: E11.9   LANCETS (ONETOUCH DELICA PLUS [id number]) MISC    USE TWICE DAILY   LIDOCAINE (LMX) 4 % CREAM    Apply 1 application topically 3 (three) times daily as needed.   LOSARTAN (COZAAR) 50 MG TABLET    Take 1 tablet (50 mg total) by mouth daily.   NITROGLYCERIN (NITROSTAT) 0.4 MG SL  TABLET    Place 1 tablet (0.4 mg total) under the tongue every 5 (five) minutes as needed for chest pain.   ONETOUCH ULTRA TEST STRIP    USE TWICE DAILY   POLYETHYLENE GLYCOL (MIRALAX / GLYCOLAX) 17 G PACKET    Take 17 g by mouth 2 (two) times daily.   POTASSIUM CHLORIDE SA (KLOR-CON) 10 MEQ TABLET    Take 1 tablet (10 mEq total) by mouth daily.   REPATHA 140 MG/ML SOSY    INJECT 1 PEN INTO THE SKIN EVERY 14 DAYS   SPIRONOLACTONE (ALDACTONE) 25 MG TABLET    Take 1 tablet (25 mg  total) by mouth daily.   TRIAMCINOLONE (KENALOG) 0.1 %    Apply 1 application topically 3 (three) times daily as needed. For itching  Modified Medications   No medications on file  Discontinued Medications   PREDNISONE (DELTASONE) 20 MG TABLET    Take 1 tablet (20 mg total) by mouth 2 (two) times daily.    Physical Exam:  Vitals:   09/21/20 1132  BP: 110/80  Pulse: 87  Temp: (!) 97.5 F (36.4 C)  TempSrc: Temporal  SpO2: 98%  Weight: 182 lb 12.8 oz (82.9 kg)  Height: 5' 6.25" (1.683 m)   Body mass index is 29.28 kg/m. Wt Readings from Last 3 Encounters:  09/21/20 182 lb 12.8 oz (82.9 kg)  09/14/20 184 lb 9.6 oz (83.7 kg)  09/08/20 168 lb (76.2 kg)    Physical Exam Constitutional:      General: He is not in acute distress.    Appearance: He is well-developed. He is not diaphoretic.  HENT:     Head: Normocephalic and atraumatic.     Mouth/Throat:     Pharynx: No oropharyngeal exudate.  Eyes:     Conjunctiva/sclera: Conjunctivae normal.     Pupils: Pupils are equal, round, and reactive to light.  Cardiovascular:     Rate and Rhythm: Normal rate and regular rhythm.     Heart sounds: Normal heart sounds.  Pulmonary:     Effort: Pulmonary effort is normal.     Breath sounds: Normal breath sounds.  Abdominal:     General: Bowel sounds are normal. There is no distension.     Palpations: Abdomen is soft.     Tenderness: There is no abdominal tenderness. There is no guarding.  Musculoskeletal:         General: No tenderness.     Cervical back: Normal range of motion and neck supple.     Right lower leg: No edema.     Left lower leg: No edema.  Skin:    General: Skin is warm and dry.  Neurological:     Mental Status: He is alert and oriented to person, place, and time.     Labs reviewed: Basic Metabolic Panel: Recent Labs    10/16/19 0403 10/17/19 0419 10/21/19 1618 10/25/19 1014 06/24/20 1110 09/14/20 0956 09/21/20 0941  NA 138   < > 138   < > 139 139 140  K 3.9   < > 4.9   < > 4.5 4.2 4.7  CL 103   < > 100   < > 103 104 103  CO2 26   < > 29   < > 26 27 29   GLUCOSE 146*   < > 162*   < > 159* 195* 169*  BUN 8   < > 17   < > 18 10 13   CREATININE 1.05   < > 1.19   < > 1.36* 1.38* 1.69*  CALCIUM 8.7*   < > 9.7   < > 9.0 9.6 9.5  MG 1.9  --   --   --   --   --   --   TSH  --   --  1.72  --   --   --   --    < > = values in this interval not displayed.   Liver Function Tests: Recent Labs    10/13/19 2202 01/20/20 1059 05/25/20 1202 09/14/20 0956  AST 28 18 17 24   ALT 28 15 13 24   ALKPHOS 58  64  --  70  BILITOT 0.4 0.5 0.5 1.1  PROT 6.2* 5.8* 6.4 7.3  ALBUMIN 3.5 3.2*  --  3.8   No results for input(s): LIPASE, AMYLASE in the last 8760 hours. No results for input(s): AMMONIA in the last 8760 hours. CBC: Recent Labs    10/13/19 2202 10/13/19 2211 10/21/19 1618 12/24/19 1940 01/20/20 1059 04/27/20 0948 06/08/20 1451 06/24/20 1110 09/14/20 0956  WBC 4.7   < > 7.2   < > 5.2   < > 5.8 5.2 8.4  NEUTROABS 3.6  --  3,982  --  2.7  --   --   --   --   HGB 14.7   < > 15.5   < > 13.3   < > 14.8 14.7 16.3  HCT 43.6   < > 46.3   < > 40.2   < > 43.4 43.0 49.4  MCV 93.0   < > 91.5   < > 91.8   < > 91.6 89.0 88.1  PLT 203   < > 338   < > 228   < > 227 240 225   < > = values in this interval not displayed.   Lipid Panel: Recent Labs    05/25/20 1202  CHOL 129  HDL 54  LDLCALC 58  TRIG 85  CHOLHDL 2.4   TSH: Recent Labs    10/21/19 1618  TSH 1.72    A1C: Lab Results  Component Value Date   HGBA1C 8.4 (A) 08/11/2020     Assessment/Plan 1. Type 2 diabetes mellitus with diabetic neuropathy, with long-term current use of insulin ([place]) -encouraged dietary modifications with continued compliance with diabetic medication. Following with endocrinology routinely, routine foot care/monitoring and to keep up with diabetic eye exams through ophthalmology   2. Essential hypertension Controlled at this time. Continues on lasix, coreg, losartan and spironolactone.   3. Intermittent claudication (HCC) Stable, no ongoing symptoms at this time.   4. PAF (paroxysmal atrial fibrillation) (HCC) Rate controlled, continues on eliquis on anticoagulation  - TSH  5. Chronic systolic heart failure (HCC) -euvolemic at this time. Continues on lasix with spirolactone. Discussed fluid restriction with low sodium diet. Has follow up with cardiology for ongoing lab work to monitor kidney function. - Brain Natriuretic Peptide   Next appt: 4 months for routine follow up, sooner if needed  Dontavian Marchi K. [place], [facility] [phone]

## 2020-09-22 LAB — BRAIN NATRIURETIC PEPTIDE: Brain Natriuretic Peptide: 42 pg/mL (ref <100)

## 2020-09-22 LAB — TSH: TSH: 1.92 mIU/L (ref 0.40–4.50)

## 2020-09-23 ENCOUNTER — Ambulatory Visit (AMBULATORY_SURGERY_CENTER): Payer: Self-pay | Admitting: *Deleted

## 2020-09-23 ENCOUNTER — Other Ambulatory Visit: Payer: Self-pay

## 2020-09-23 VITALS — Ht 66.25 in | Wt 176.0 lb

## 2020-09-23 DIAGNOSIS — Z86010 Personal history of colonic polyps: Secondary | ICD-10-CM

## 2020-09-23 NOTE — Progress Notes (Signed)
Patient is here in-person for PV. Patient denies any allergies to eggs or soy. Patient denies any problems with anesthesia/sedation. Patient denies any oxygen use at home. Patient denies taking any diet/weight loss medications. On  blood thinner-pt aware to stop 2 days before procedure. Patient is not being treated for MRSA or C-diff. Patient is aware of our care-partner policy and AFQEH-87 safety protocol.   Patient is fully COVID-19 vaccinated, per patient.

## 2020-09-23 NOTE — Chronic Care Management (AMB) (Signed)
Contacted patient on behalf of [facility]. Reviewed last fill dates and it appears he should be out or running low on medications. Patient stated that he did not feel well today and asked if we could call back another day. Patient was asked if he was out of medications or needed anything refilled in the next couple of days but he stated he had all of his medications.   Follow-Up:  [facility]ination [facility]  Debbora Dus, CPP notified  Margaretmary Dys, [place] [facility] Assistant [phone]

## 2020-09-24 ENCOUNTER — Other Ambulatory Visit: Payer: Self-pay

## 2020-09-24 NOTE — Patient Instructions (Addendum)
  Goals    . [facility] - Monitor and Manage My Blood Sugar     Timeframe:  Long-Range Goal Priority:  High Start Date:  2/23//                           Expected End Date:  03/03/21                    Follow Up Date 10/31/20   - check blood sugar at prescribed times - take the blood sugar log to all doctor visits    Why is this important?   Checking your blood sugar at home helps to keep it from getting very high or very low.  Writing the results in a diary or log helps the doctor know how to care for you.  Your blood sugar log should have the time, date and the results.  Also, write down the amount of insulin or other medicine that you take.  Other information, like what you ate, exercise done and how you were feeling, will also be helpful.     Notes:   11/8 - Reminded to monitor multiple times a day with insulin changes. Patient checks sugars regularly- patient reports sugars tend to be high in the am.    12/9 - Reviewed insulin changes as well as appointment with nutritionist  09/24/20 Patient sugars remain in the 300 range last A1c 8.4.  Encouraged patient to really watch his diet and limit sweets and carbohydrate containing food.    Margie Billet - Set My Target A1C     Follow Up Date 10/31/20   - set target A1C - Less than 8   Why is this important?   Your target A1C is decided together by you and your doctor.  It is based on several things like your age and other health issues.    Notes:   11/8 - decreased to 9.3 on 04/22/20  12/9 - decreased to 8.9 in December 2021  08/26/20- A1c 8.4 08-11-20  09/24/20 patient continues to work at lowering A1c.

## 2020-09-24 NOTE — Patient Outreach (Addendum)
[facility]) Care Management  [place]  09/24/2020   Calvin [name] [birth year] [id number]  Subjective: Telephone call to patient for follow up. Patient reports he is taking extra lasix right now due to some increased fluid.  He reports that his weight yesterday was 176 lbs and has not weighed yet this morning.  He reports sugars remain in the 300 range.  Discussed low salt foods and importance of limiting salt and carbohydrates.  He verbalized understanding and knows when to notify physician for changes.  Objective:   Encounter Medications:  Outpatient Encounter Medications as of 09/24/2020  Medication Sig  . albuterol (VENTOLIN HFA) 108 (90 Base) MCG/ACT inhaler Inhale 2 puffs into the lungs every 6 (six) hours as needed for wheezing or shortness of breath.  [name] amitriptyline (ELAVIL) 10 MG tablet Take 2 tablets at bed time  . atorvastatin (LIPITOR) 80 MG tablet Take 1 tablet (80 mg total) by mouth daily. (Patient taking differently: Take 80 mg by mouth every evening.)  . carvedilol (COREG) 6.25 MG tablet Take 1 tablet (6.25 mg total) by mouth 2 (two) times daily with a meal.  . dapagliflozin propanediol (FARXIGA) 10 MG TABS tablet Take 1 tablet (10 mg total) by mouth daily before breakfast.  . donepezil (ARICEPT) 10 MG tablet Take 1 tablet (10 mg total) by mouth at bedtime.  [name] ELIQUIS 5 MG TABS tablet TAKE 1 TABLET BY MOUTH TWICE DAILY  . ezetimibe (ZETIA) 10 MG tablet Take 1 tablet (10 mg total) by mouth daily.  . furosemide (LASIX) 40 MG tablet Take 1 tablet (40 mg total) by mouth daily.  . insulin degludec (TRESIBA FLEXTOUCH) 200 UNIT/ML FlexTouch Pen Inject 48 Units into the skin daily.  . Insulin Syringe-Needle U-100 (INSULIN SYRINGE .3CC/29GX1/2") 29G X 1/2" 0.3 ML MISC Inject 1 Syringe 3 (three) times daily as directed. Check blood sugar three times daily. Dx: E11.9 (Patient taking differently: Inject 1 Syringe as directed See admin instructions. Check  blood sugar three times daily. Dx: E11.9)  . Lancets (ONETOUCH DELICA PLUS [id number]) MISC USE TWICE DAILY  . lidocaine (LMX) 4 % cream Apply 1 application topically 3 (three) times daily as needed.  [name] losartan (COZAAR) 50 MG tablet Take 1 tablet (50 mg total) by mouth daily.  . nitroGLYCERIN (NITROSTAT) 0.4 MG SL tablet Place 1 tablet (0.4 mg total) under the tongue every 5 (five) minutes as needed for chest pain.  [name] ULTRA test strip USE TWICE DAILY  . polyethylene glycol (MIRALAX / GLYCOLAX) 17 g packet Take 17 g by mouth 2 (two) times daily.  . potassium chloride SA (KLOR-CON) 10 MEQ tablet Take 1 tablet (10 mEq total) by mouth daily.  . predniSONE (DELTASONE) 20 MG tablet Take 1 tablet (20 mg total) by mouth 2 (two) times daily.  [name] REPATHA 140 MG/ML SOSY INJECT 1 PEN INTO THE SKIN EVERY 14 DAYS  . spironolactone (ALDACTONE) 25 MG tablet Take 1 tablet (25 mg total) by mouth daily.  [name] triamcinolone (KENALOG) 0.1 % Apply 1 application topically 3 (three) times daily as needed. For itching   No facility-administered encounter medications on file as of 09/24/2020.    Functional Status:  In your present state of health, do you have any difficulty performing the following activities: 09/24/2020 07/23/2020  Hearing? N -  Vision? N Y  Difficulty concentrating or making decisions? Y Y  Comment some memory issues -  Walking or climbing stairs? Y Y  Comment patient reports some shortness  of breath. -  Dressing or bathing? N N  Doing errands, shopping? Tempie Donning  Comment family assists does not drive often  Preparing Food and eating ? N N  Using the Toilet? N N  In the past six months, have you accidently leaked urine? N Y  Do you have problems with loss of bowel control? N N  Managing your Medications? Y Y  Comment paramedicine sees patient for med fills -  Managing your Finances? Tempie Donning  Comment family assists -  Housekeeping or managing your Housekeeping? Tempie Donning  Comment family assists -   Some recent data might be hidden    Fall/Depression Screening: Fall Risk  09/24/2020 07/23/2020 07/13/2020  Falls in the past year? 0 1 0  Number falls in past yr: - 1 0  Injury with Fall? - 0 -  Risk for fall due to : - - -  Follow up - - -   PHQ 2/9 Scores 09/24/2020 07/23/2020 07/01/2020 05/25/2020 07/15/2019 05/01/2019 04/26/2018  PHQ - 2 Score 0 0 1 0 0 0 0    Assessment:  Goals Addressed              This Visit's Progress   .  COMPLETED: Get my blood sugars better (pt-stated)        CARE PLAN ENTRY (see longtitudinal plan of care for additional care plan information)  Objective:  Lab Results  Component Value Date   HGBA1C 10.1 (A) 01/15/2020 .   Lab Results  Component Value Date   CREATININE 1.26 (H) 01/20/2020   CREATININE 1.53 (H) 12/24/2019   CREATININE 1.24 10/25/2019 .   [name] No results found for: EGFR  Current Barriers:  [name] Knowledge Deficits related to basic Diabetes pathophysiology and self care/management . Knowledge Deficits related to medications used for management of diabetes  Case Manager Clinical Goal(s):  Over the next 60 days, patient will demonstrate improved adherence to prescribed treatment plan for diabetes self care/management as evidenced by: decreased A1C </= 8 . Verbalize daily monitoring and recording of CBG within 28 days . Verbalize adherence to ADA/ carb modified diet within the next 28 days . Verbalize adherence to prescribed medication regimen within the next 28 days   Interventions:  . Provided education to patient about basic DM disease process . Provided patient with written educational materials related to hypo and hyperglycemia and importance of correct treatment . Reviewed scheduled/upcoming provider appointments including: endocrinologist . Advised patient, providing education and rationale, to check cbg 3 times a day and record, calling MD for findings outside established parameters.    Patient Self Care Activities:  . Self  administers injectable DM medication [name]) as prescribed . Attends all scheduled provider appointments . Checks blood sugars as prescribed and utilize hyper and hypoglycemia protocol as needed  Initial goal documentation     .  [facility] - Monitor and Manage My Blood Sugar   On track     Timeframe:  Long-Range Goal Priority:  High Start Date:  2/23//                           Expected End Date:  03/03/21                    Follow Up Date 10/31/20   - check blood sugar at prescribed times - take the blood sugar log to all doctor visits    Why is this important?  Checking your blood sugar at home helps to keep it from getting very high or very low.  Writing the results in a diary or log helps the doctor know how to care for you.  Your blood sugar log should have the time, date and the results.  Also, write down the amount of insulin or other medicine that you take.  Other information, like what you ate, exercise done and how you were feeling, will also be helpful.     Notes:   11/8 - Reminded to monitor multiple times a day with insulin changes. Patient checks sugars regularly- patient reports sugars tend to be high in the am.    12/9 - Reviewed insulin changes as well as appointment with nutritionist  09/24/20 Patient sugars remain in the 300 range last A1c 8.4.  Encouraged patient to really watch his diet and limit sweets and carbohydrate containing food.    Margie Billet - Set My Target A1C   On track     Follow Up Date 10/31/20   - set target A1C - Less than 8   Why is this important?   Your target A1C is decided together by you and your doctor.  It is based on several things like your age and other health issues.    Notes:   11/8 - decreased to 9.3 on 04/22/20  12/9 - decreased to 8.9 in December 2021  08/26/20- A1c 8.4 08-11-20  09/24/20 patient continues to work at lowering A1c.         Plan: RN CM will outreach next month. Follow-up:  Patient agrees to Care Plan and  Follow-up.   Jone Baseman, RN, MSN [facility] Care Management Coordinator Direct Line [phone] Cell [phone] Toll Free: [phone]  Fax: [phone]

## 2020-09-25 ENCOUNTER — Encounter: Payer: Self-pay | Admitting: Internal Medicine

## 2020-09-26 ENCOUNTER — Emergency Department (HOSPITAL_COMMUNITY): Payer: Medicare HMO

## 2020-09-26 ENCOUNTER — Emergency Department (HOSPITAL_COMMUNITY)
Admission: EM | Admit: 2020-09-26 | Discharge: 2020-09-27 | Disposition: A | Discharge status: Home / Self Care | Payer: Medicare HMO | Attending: Emergency Medicine | Admitting: Emergency Medicine

## 2020-09-26 ENCOUNTER — Encounter (HOSPITAL_COMMUNITY): Payer: Self-pay | Admitting: Emergency Medicine

## 2020-09-26 DIAGNOSIS — R0602 Shortness of breath: Secondary | ICD-10-CM | POA: Diagnosis not present

## 2020-09-26 DIAGNOSIS — R0789 Other chest pain: Secondary | ICD-10-CM | POA: Diagnosis not present

## 2020-09-26 DIAGNOSIS — Z7901 Long term (current) use of anticoagulants: Secondary | ICD-10-CM | POA: Diagnosis not present

## 2020-09-26 DIAGNOSIS — I110 Hypertensive heart disease with heart failure: Secondary | ICD-10-CM | POA: Diagnosis not present

## 2020-09-26 DIAGNOSIS — N289 Disorder of kidney and ureter, unspecified: Secondary | ICD-10-CM | POA: Diagnosis not present

## 2020-09-26 DIAGNOSIS — E1140 Type 2 diabetes mellitus with diabetic neuropathy, unspecified: Secondary | ICD-10-CM | POA: Diagnosis not present

## 2020-09-26 DIAGNOSIS — R739 Hyperglycemia, unspecified: Secondary | ICD-10-CM

## 2020-09-26 DIAGNOSIS — Z8616 Personal history of COVID-19: Secondary | ICD-10-CM | POA: Insufficient documentation

## 2020-09-26 DIAGNOSIS — I5022 Chronic systolic (congestive) heart failure: Secondary | ICD-10-CM | POA: Insufficient documentation

## 2020-09-26 DIAGNOSIS — I2510 Atherosclerotic heart disease of native coronary artery without angina pectoris: Secondary | ICD-10-CM | POA: Diagnosis not present

## 2020-09-26 DIAGNOSIS — R079 Chest pain, unspecified: Secondary | ICD-10-CM | POA: Diagnosis not present

## 2020-09-26 DIAGNOSIS — R778 Other specified abnormalities of plasma proteins: Secondary | ICD-10-CM | POA: Diagnosis not present

## 2020-09-26 DIAGNOSIS — Z20822 Contact with and (suspected) exposure to covid-19: Secondary | ICD-10-CM | POA: Diagnosis not present

## 2020-09-26 DIAGNOSIS — R7989 Other specified abnormal findings of blood chemistry: Secondary | ICD-10-CM | POA: Diagnosis not present

## 2020-09-26 DIAGNOSIS — Z79899 Other long term (current) drug therapy: Secondary | ICD-10-CM | POA: Diagnosis not present

## 2020-09-26 DIAGNOSIS — E1165 Type 2 diabetes mellitus with hyperglycemia: Secondary | ICD-10-CM | POA: Diagnosis not present

## 2020-09-26 DIAGNOSIS — Z794 Long term (current) use of insulin: Secondary | ICD-10-CM | POA: Insufficient documentation

## 2020-09-26 LAB — BASIC METABOLIC PANEL
Anion gap: 7 (ref 5–15)
BUN: 17 mg/dL (ref 6–20)
CO2: 28 mmol/L (ref 22–32)
Calcium: 9.2 mg/dL (ref 8.9–10.3)
Chloride: 101 mmol/L (ref 98–111)
Creatinine, Ser: 1.68 mg/dL — ABNORMAL HIGH (ref 0.61–1.24)
GFR, Estimated: 46 mL/min — ABNORMAL LOW (ref >60)
Glucose, Bld: 206 mg/dL — ABNORMAL HIGH (ref 70–99)
Potassium: 3.8 mmol/L (ref 3.5–5.1)
Sodium: 136 mmol/L (ref 135–145)

## 2020-09-26 LAB — CBC
HCT: 44.7 % (ref 39.0–52.0)
Hemoglobin: 15.2 g/dL (ref 13.0–17.0)
MCH: 29.5 pg (ref 26.0–34.0)
MCHC: 34 g/dL (ref 30.0–36.0)
MCV: 86.6 fL (ref 80.0–100.0)
Platelets: 229 10*3/uL (ref 150–400)
RBC: 5.16 MIL/uL (ref 4.22–5.81)
RDW: 12.3 % (ref 11.5–15.5)
WBC: 5.8 10*3/uL (ref 4.0–10.5)
nRBC: 0 % (ref 0.0–0.2)

## 2020-09-26 LAB — HEPATIC FUNCTION PANEL
ALT: 24 U/L (ref 0–44)
AST: 24 U/L (ref 15–41)
Albumin: 3.6 g/dL (ref 3.5–5.0)
Alkaline Phosphatase: 62 U/L (ref 38–126)
Bilirubin, Direct: <0.1 mg/dL (ref 0.0–0.2)
Total Bilirubin: 0.6 mg/dL (ref 0.3–1.2)
Total Protein: 6.7 g/dL (ref 6.5–8.1)

## 2020-09-26 LAB — LIPASE, BLOOD: Lipase: 35 U/L (ref 11–51)

## 2020-09-26 LAB — TROPONIN I (HIGH SENSITIVITY)
Troponin I (High Sensitivity): 22 ng/L — ABNORMAL HIGH (ref <18)
Troponin I (High Sensitivity): 22 ng/L — ABNORMAL HIGH (ref <18)

## 2020-09-26 LAB — RESP PANEL BY RT-PCR (FLU A&B, COVID) ARPGX2
Influenza A by PCR: NEGATIVE
Influenza B by PCR: NEGATIVE
SARS Coronavirus 2 by RT PCR: NEGATIVE

## 2020-09-26 LAB — BRAIN NATRIURETIC PEPTIDE: B Natriuretic Peptide: 34.4 pg/mL (ref 0.0–100.0)

## 2020-09-26 MED ORDER — ALUM & MAG HYDROXIDE-SIMETH 200-200-20 MG/5ML PO SUSP
30.0000 mL | Freq: Once | ORAL | Status: AC
  Administered 2020-09-27: 30 mL via ORAL
  Filled 2020-09-26: qty 30

## 2020-09-26 MED ORDER — ASPIRIN 81 MG PO CHEW
324.0000 mg | CHEWABLE_TABLET | Freq: Once | ORAL | Status: AC
  Administered 2020-09-26: 324 mg via ORAL
  Filled 2020-09-26: qty 4

## 2020-09-26 NOTE — Discharge Instructions (Signed)
Today your kidney function is slightly higher than usual.  You may be a little bit dehydrated due to some of the changes in your medications. Please make sure you are drinking extra water (about 8-16 oz)and giving your body time to adjust with position changes.  Please call your cardiolgist office on Monday to have them see you and evaluate you.  I suspect that you may be a little bit of what we call over diuresed meaning that the medicines that help you pee out any extra fluid may be working a little bit too well.  If you develop fevers, any worsening or new concerning/new symptoms please seek additional medical care and evaluation.

## 2020-09-26 NOTE — ED Provider Notes (Signed)
I assumed care of patient from Dr. Eulis Foster at the end of his shift, please see his note for full H&P. Briefly patient is here for multiple symptoms including right-sided chest and abdominal pain, shortness of breath, cough, that is been ongoing for about 5 days.  BMP shows that his creatinine is slightly elevated and was 5 days ago when compared to previous.  It appears he has had multiple medication changes and a question if he may have a episode of overdiuresis especially given that he states he gets more lightheaded with position changes than usual.  Aside from that BMP shows elevated sugar at 206.  CBC, hepatic function panel, BNP, lipase and Covid tests are all unremarkable.  While in the emergency room he is hypertensive however 99 to 100% on room air.  Troponin x2 is flat at 22.  Given that his symptoms have been ongoing for about 5 days with more right-sided pain than left and him reporting compliance with his medications including his anticoagulants doubt a acute cardiac cause of his pain.  GI cocktail was ordered while patient was in the emergency room.  When I went to discuss patient's results with him including his flat troponin he was eager to call his ride and go home.  It does appear that he is followed by community para medicine program.  Message sent to Dr. Nani Ravens who appears to be his primary cardiologist to help ensure outpatient follow-up as he may have a element of overdiuresis given his creatinine has been slightly elevated over the past 5 days.  Patient is agreeable and very eager for discharge home.  Return precautions were discussed with patient who states their understanding.  At the time of discharge patient denied any unaddressed complaints or concerns.  Patient is agreeable for discharge home.  Note: Portions of this report may have been transcribed using voice recognition software. Every effort was made to ensure accuracy; however, inadvertent computerized  transcription errors may be present    Calvin [name] 09/26/20 2329    Calvin Bo, MD 09/27/20 2046

## 2020-09-26 NOTE — ED Triage Notes (Signed)
Pt reports chest pain, indigestion, SOB, non-productive cough, and pain to L side of back since Monday.  Seen by PCP and told to come to ED if not better by the weekend.

## 2020-09-26 NOTE — ED Provider Notes (Signed)
[facility] EMERGENCY DEPARTMENT Provider Note   CSN: [id number] Arrival date & time: 09/26/20  1834     History Chief Complaint  Patient presents with  . Chest Pain  . Shortness of Breath    Calvin [name] is a 60 y.o. male.  HPI Reports ongoing difficulty with multiple symptoms including right-sided pain, shortness of breath, cough productive of white sputum, chest pain, back pain, abdominal pain.  He saw his PCP for similar symptoms, 5 days ago and was instructed on symptomatic treatment.  He states he has not gotten any better.  He states his blood sugars have been running 300-400.  He has had 2 COVID vaccines, but not a booster.  He follows up with the heart failure clinic regularly.  There are no other known modifying factors.    Past Medical History:  Diagnosis Date  . Adenoidal hypertrophy   . Adenomatous colon polyps 2012  . AKI (acute kidney injury) ([place]) 06/17/2019  . Arthritis   . Cataract    Mixed OU  . COVID-19 virus detected 06/18/2019  . Diabetes mellitus   . Diabetic retinopathy ([place])    NPDR OU  . GERD (gastroesophageal reflux disease)   . Heart attack ([place])    While living in [place]  [name] Hx of adenomatous colonic polyps 08/18/2017  . Hyperlipidemia   . Hypertension   . Hypertensive retinopathy    OU  . Mild CAD    a. cath in 08/2017 showing mild nonobstructive CAD with scattered 20-30% stenosis.   . Nonischemic cardiomyopathy ([place])    a. EF 20-25% by echo in 09/2017 with cath showing mild CAD. b.  Last echo 12/2017 EF 35-40%, grade 2 DD.  [name] Obesity   . PAF (paroxysmal atrial fibrillation) ([place])   . Sleep apnea    cpap, pt says no longer has    Patient Active Problem List   Diagnosis Date Noted  . Adrenal nodule ([place]) 09/21/2020  . Intermittent claudication ([place]) 05/25/2020  . Hypoglycemia due to insulin 10/15/2019  . Chronic anticoagulation 10/15/2019  . Right sided weakness 06/17/2019  . Foot pain, bilateral 02/01/2019  .  Overgrown toenails 12/20/2018  . Unsteady gait 12/20/2018  . Encounter for therapeutic drug monitoring 10/22/2018  . AF (paroxysmal atrial fibrillation) ([place]) 03/10/2018  . Diabetic neuropathy ([place]) 09/26/2017  . MCI (mild cognitive impairment) 06/23/2017  . OSA on CPAP 05/10/2017  . Gastroesophageal reflux disease 05/10/2017  . Insomnia 05/10/2017  . Onychomycosis of multiple toenails with type 2 diabetes mellitus and peripheral neuropathy ([place]) 05/10/2017  . Chronic systolic heart failure ([place]) 11/22/2015  . Essential hypertension 11/22/2015  . DM (diabetes mellitus) ([place]) 11/20/2015  . HLD (hyperlipidemia) 11/20/2015  . Hx of adenomatous colonic polyps 05/19/2011    Past Surgical History:  Procedure Laterality Date  . COLONOSCOPY  05/13/11, 08/2017   9 adenomas  . FOREARM SURGERY    . MUSCLE BIOPSY    . POLYPECTOMY    . RIGHT/LEFT HEART CATH AND CORONARY ANGIOGRAPHY N/A 08/25/2017   Procedure: RIGHT/LEFT HEART CATH AND CORONARY ANGIOGRAPHY;  Surgeon: [name], MD;  Location: [facility];  Service: Cardiovascular;  Laterality: N/A;  . RIGHT/LEFT HEART CATH AND CORONARY ANGIOGRAPHY N/A 10/01/2019   Procedure: RIGHT/LEFT HEART CATH AND CORONARY ANGIOGRAPHY;  Surgeon: [name], MD;  Location: [facility];  Service: Cardiovascular;  Laterality: N/A;       Family History  Problem Relation Age of Onset  . Heart disease  Mother   . Heart attack Mother 45  . Hypertension Mother   . Hyperlipidemia Mother   . Diabetes Mother   . Heart disease Father   . Heart attack Father 30  . Hypertension Father   . Hyperlipidemia Father   . Diabetes Father   . Heart attack Sister 23  . Colon cancer Neg Hx   . Stomach cancer Neg Hx   . Esophageal cancer Neg Hx   . Rectal cancer Neg Hx   . Colon polyps Neg Hx     Social History   Tobacco Use  . Smoking status: Never Smoker  . Smokeless tobacco: Never Used  Vaping Use  . Vaping Use: Never used   Substance Use Topics  . Alcohol use: No  . Drug use: No    Home Medications Prior to Admission medications   Medication Sig Start Date End Date Taking? Authorizing Provider  albuterol (VENTOLIN HFA) 108 (90 Base) MCG/ACT inhaler Inhale 2 puffs into the lungs every 6 (six) hours as needed for wheezing or shortness of breath. 06/30/19   Thurnell Lose, MD  amitriptyline (ELAVIL) 10 MG tablet Take 2 tablets at bed time 08/27/19   Cameron Sprang, MD  atorvastatin (LIPITOR) 80 MG tablet Take 1 tablet (80 mg total) by mouth daily. Patient taking differently: Take 80 mg by mouth every evening. 08/07/20 08/02/21  [name], [name], MD  carvedilol (COREG) 6.25 MG tablet Take 1 tablet (6.25 mg total) by mouth 2 (two) times daily with a meal. 08/07/20   [name], [name], MD  dapagliflozin propanediol (FARXIGA) 10 MG TABS tablet Take 1 tablet (10 mg total) by mouth daily before breakfast. 06/24/20   [name], [name], MD  donepezil (ARICEPT) 10 MG tablet Take 1 tablet (10 mg total) by mouth at bedtime. 08/27/19   Cameron Sprang, MD  ELIQUIS 5 MG TABS tablet TAKE 1 TABLET BY MOUTH TWICE DAILY 05/27/20   Larey Dresser, MD  ezetimibe (ZETIA) 10 MG tablet Take 1 tablet (10 mg total) by mouth daily. 08/05/20   [name], MD  furosemide (LASIX) 40 MG tablet Take 1 tablet (40 mg total) by mouth daily. 09/14/20 09/14/21  Lyda Jester M, [name]  insulin degludec (TRESIBA FLEXTOUCH) 200 UNIT/ML FlexTouch Pen Inject 48 Units into the skin daily. 08/11/20   [name], MD  Insulin Syringe-Needle U-100 (INSULIN SYRINGE .3CC/29GX1/2") 29G X 1/2" 0.3 ML MISC Inject 1 Syringe 3 (three) times daily as directed. Check blood sugar three times daily. Dx: E11.9 Patient taking differently: Inject 1 Syringe as directed See admin instructions. Check blood sugar three times daily. Dx: E11.9 05/10/17   [name], DO  Lancets (ONETOUCH DELICA PLUS [id number]) [place] 10/30/19   [name],  MD  lidocaine (LMX) 4 % cream Apply 1 application topically 3 (three) times daily as needed. 12/24/19   Nils Flack, Mina A, [name]  losartan (COZAAR) 50 MG tablet Take 1 tablet (50 mg total) by mouth daily. 09/14/20 12/13/20  Consuelo Pandy, [name]  nitroGLYCERIN (NITROSTAT) 0.4 MG SL tablet Place 1 tablet (0.4 mg total) under the tongue every 5 (five) minutes as needed for chest pain. 10/27/17   [name], DO  [facility] ULTRA test strip USE TWICE DAILY 10/30/19   [name], MD  polyethylene glycol (MIRALAX / GLYCOLAX) 17 g packet Take 17 g by mouth 2 (two) times daily. 06/30/19   Thurnell Lose, MD  potassium chloride SA (KLOR-CON) 10 MEQ tablet Take  1 tablet (10 mEq total) by mouth daily. 09/14/20   Simmons, Brittainy M, [name]  REPATHA 140 MG/ML SOSY INJECT 1 PEN INTO THE SKIN EVERY 14 DAYS 09/08/20   [name], [name], MD  spironolactone (ALDACTONE) 25 MG tablet Take 1 tablet (25 mg total) by mouth daily. 08/07/20   [name], [name], MD  triamcinolone (KENALOG) 0.1 % Apply 1 application topically 3 (three) times daily as needed. For itching 06/09/20   [name], MD    Allergies    Patient has no known allergies.  Review of Systems   Review of Systems  All other systems reviewed and are negative.   Physical Exam Updated Vital Signs BP (!) 146/91   Pulse 96   Temp 98.2 F (36.8 C) (Oral)   Resp 19   SpO2 100%   Physical Exam Vitals and nursing note reviewed.  Constitutional:      General: He is not in acute distress.    Appearance: He is well-developed. He is not ill-appearing, toxic-appearing or diaphoretic.  HENT:     Head: Normocephalic and atraumatic.     Right Ear: External ear normal.     Left Ear: External ear normal.     Nose: No congestion or rhinorrhea.     Mouth/Throat:     Pharynx: No oropharyngeal exudate or posterior oropharyngeal erythema.  Eyes:     Conjunctiva/sclera: Conjunctivae normal.     Pupils: Pupils are equal, round, and reactive to light.   Neck:     Trachea: Phonation normal.  Cardiovascular:     Rate and Rhythm: Normal rate and regular rhythm.     Heart sounds: Normal heart sounds.     Comments: No JVD Pulmonary:     Effort: Pulmonary effort is normal. No respiratory distress.     Breath sounds: Normal breath sounds. No stridor. No wheezing or rhonchi.  Abdominal:     Palpations: Abdomen is soft.     Tenderness: There is no abdominal tenderness.  Musculoskeletal:        General: Normal range of motion.     Cervical back: Normal range of motion and neck supple.     Right lower leg: No edema.     Left lower leg: No edema.  Skin:    General: Skin is warm and dry.  Neurological:     Mental Status: He is alert and oriented to person, place, and time.     Cranial Nerves: No cranial nerve deficit.     Sensory: No sensory deficit.     Motor: No abnormal muscle tone.     Coordination: Coordination normal.  Psychiatric:        Mood and Affect: Mood is anxious.        Behavior: Behavior normal.        Thought Content: Thought content normal.        Judgment: Judgment normal.     ED Results / Procedures / Treatments   Labs (all labs ordered are listed, but only abnormal results are displayed) Labs Reviewed  BASIC METABOLIC PANEL - Abnormal; Notable for the following components:      Result Value   Glucose, Bld 206 (*)    Creatinine, Ser 1.68 (*)    GFR, Estimated 46 (*)    All other components within normal limits  TROPONIN I (HIGH SENSITIVITY) - Abnormal; Notable for the following components:   Troponin I (High Sensitivity) 22 (*)    All other components within normal limits  RESP  PANEL BY RT-PCR (FLU A&B, COVID) ARPGX2  CBC  TROPONIN I (HIGH SENSITIVITY)    EKG EKG Interpretation  Date/Time:  Saturday September 26 2020 18:46:04 EDT Ventricular Rate:  98 PR Interval:  168 QRS Duration: 92 QT Interval:  332 QTC Calculation: 423 R Axis:   8 Text Interpretation: Normal sinus rhythm ST & T wave abnormality,  consider lateral ischemia Abnormal ECG since last tracing no significant change Confirmed by Daleen Bo [phone]) on 09/26/2020 7:56:33 PM   Radiology DG Chest 2 View  Result Date: 09/26/2020 CLINICAL DATA:  Chest pain, indigestion, shortness of breath, left back pain EXAM: CHEST - 2 VIEW COMPARISON:  06/08/2020 FINDINGS: The heart size and mediastinal contours are within normal limits. Both lungs are clear. The visualized skeletal structures are unremarkable. IMPRESSION: No active cardiopulmonary disease. Electronically Signed   By: Randa Ngo M.D.   On: 09/26/2020 19:23    Procedures Procedures   Medications Ordered in ED Medications  aspirin chewable tablet 324 mg (has no administration in time range)    ED Course  I have reviewed the triage vital signs and the nursing notes.  Pertinent labs & imaging results that were available during my care of the patient were reviewed by me and considered in my medical decision making (see chart for details).    MDM Rules/Calculators/A&P                           Patient Vitals for the past 24 hrs:  BP Temp Temp src Pulse Resp SpO2  09/26/20 2015 (!) 146/91 -- -- 96 19 100 %  09/26/20 1850 113/73 98.2 F (36.8 C) Oral 96 18 100 %    8:38 PM Reevaluation with update and discussion. After initial assessment and treatment, an updated evaluation reveals no change in clinical status, findings discussed. Daleen Bo   Medical Decision Making:  This patient is presenting for evaluation of nonspecific symptoms including cough, shortness of breath, chest and abdominal pain, which does require a range of treatment options, and is a complaint that involves a moderate risk of morbidity and mortality. The differential diagnoses include bronchitis, CHF, acute illness, acute infection. I decided to review old records, and in summary patient with congestive heart failure follows outpatient, diabetes and multiple medical problems presenting for  nonspecific symptoms requiring evaluation..  I did not require additional historical information from anyone.  Clinical Laboratory Tests Ordered, included CBC, Metabolic panel and Troponin, BNP, Covid test, flu test. Review indicates initial troponin elevated.  Glucose high, creatinine high, GFR low. Radiologic Tests Ordered, included chest x-ray.  I independently Visualized: Radiograph images, which show no infiltrate or edema  Cardiac Monitor Tracing which shows normal sinus rhythm    Critical Interventions-EPIC evaluation, laboratory testing, radiography, observation reassessment  After These Interventions, the Patient was reevaluated and was found with nonspecific symptoms, negative chest x-ray, elevated troponin and mild renal insufficiency.  Delta troponin ordered.  CRITICAL CARE-no Performed by: Daleen Bo  Nursing Notes Reviewed/ Care Coordinated Applicable Imaging Reviewed Interpretation of Laboratory Data incorporated into ED treatment   Care to PCP to dispostion after testing completed.  Final Clinical Impression(s) / ED Diagnoses Final diagnoses:  Chest pain, unspecified type  Elevated troponin  Renal insufficiency    Rx / DC Orders ED Discharge Orders    None       Daleen Bo, MD 09/26/20 2057

## 2020-09-26 NOTE — ED Notes (Signed)
Pt ambulated self in room with pulse ox attached. Oxygen remained 99-100% and pulse 90-98. Pt stated that he felt increased dizziness and lightheadedness after ambulated for approximately 30 seconds, to which this RN instructed pt to lay back down. Pt stated to this RN that he always feels dizzy lately, but more-so while standing and during ambulation.

## 2020-10-02 ENCOUNTER — Encounter (HOSPITAL_COMMUNITY): Payer: Medicare HMO

## 2020-10-06 ENCOUNTER — Encounter (HOSPITAL_COMMUNITY): Payer: Medicare HMO

## 2020-10-06 ENCOUNTER — Encounter: Payer: Self-pay | Admitting: Internal Medicine

## 2020-10-06 ENCOUNTER — Ambulatory Visit: Payer: Medicare HMO | Admitting: Internal Medicine

## 2020-10-06 ENCOUNTER — Other Ambulatory Visit: Payer: Self-pay

## 2020-10-06 VITALS — BP 140/76 | HR 75 | Temp 97.8°F | Resp 14 | Ht 66.25 in | Wt 176.0 lb

## 2020-10-06 DIAGNOSIS — Z86010 Personal history of colonic polyps: Secondary | ICD-10-CM

## 2020-10-06 DIAGNOSIS — Z539 Procedure and treatment not carried out, unspecified reason: Secondary | ICD-10-CM | POA: Diagnosis not present

## 2020-10-06 DIAGNOSIS — Z1211 Encounter for screening for malignant neoplasm of colon: Secondary | ICD-10-CM | POA: Diagnosis not present

## 2020-10-06 MED ORDER — SODIUM CHLORIDE 0.9 % IV SOLN
500.0000 mL | INTRAVENOUS | Status: DC
Start: 2020-10-06 — End: 2020-10-06

## 2020-10-06 MED ORDER — POLYETHYLENE GLYCOL 3350 17 G PO PACK: 17.0000 g | PACK | Freq: Two times a day (BID) | ORAL | 0 refills | Status: AC

## 2020-10-06 NOTE — Patient Instructions (Addendum)
There was formed stool in the rectum - the cleanout did not work like we wanted so I cannot do the colonoscopy today.  Will need to reschedule - will have you see me in the office first as I see you were in the emergency department with shortness of breath and high blood sugars recently.  Having a colonoscopy is important but I think seeing your other doctors for checkups Sherrie Mustache, Dr. Loanne Drilling and Dr. Haroldine Laws) is important.  We will get you an appointment to see me in June - in the office.  You need to set up an appointment with Dr. Haroldine Laws - I will message him also.  Restart Eliquis today.  Be sure to take MiraLax 2 times every day to improve bowel movements.  I appreciate the opportunity to care for you  [name], MD, [facility]  The office will schedule an office visit in June for you.  YOU HAD AN ENDOSCOPIC PROCEDURE TODAY AT [facility]:   Refer to the procedure report that was given to you for any specific questions about what was found during the examination.  If the procedure report does not answer your questions, please call your gastroenterologist to clarify.  If you requested that your care partner not be given the details of your procedure findings, then the procedure report has been included in a sealed envelope for you to review at your convenience later.  YOU SHOULD EXPECT: Some feelings of bloating in the abdomen. Passage of more gas than usual.  Walking can help get rid of the air that was put into your GI tract during the procedure and reduce the bloating. If you had a lower endoscopy (such as a colonoscopy or flexible sigmoidoscopy) you may notice spotting of blood in your stool or on the toilet paper. If you underwent a bowel prep for your procedure, you may not have a normal bowel movement for a few days.  Please Note:  You might notice some irritation and congestion in your nose or some drainage.  This is from the oxygen used during your  procedure.  There is no need for concern and it should clear up in a day or so.  SYMPTOMS TO REPORT IMMEDIATELY:   Following lower endoscopy (colonoscopy or flexible sigmoidoscopy):  Excessive amounts of blood in the stool  Significant tenderness or worsening of abdominal pains  Swelling of the abdomen that is new, acute  Fever of 100F or higher   For urgent or emergent issues, a gastroenterologist can be reached at any hour by calling [phone]. Do not use MyChart messaging for urgent concerns.    DIET:  We do recommend a small meal at first, but then you may proceed to your regular diet.  Drink plenty of fluids but you should avoid alcoholic beverages for 24 hours.  ACTIVITY:  You should plan to take it easy for the rest of today and you should NOT DRIVE or use heavy machinery until tomorrow (because of the sedation medicines used during the test).    FOLLOW UP: Our staff will call the number listed on your records 48-72 hours following your procedure to check on you and address any questions or concerns that you may have regarding the information given to you following your procedure. If we do not reach you, we will leave a message.  We will attempt to reach you two times.  During this call, we will ask if you have developed any symptoms of COVID 19.  If you develop any symptoms (ie: fever, flu-like symptoms, shortness of breath, cough etc.) before then, please call [phone].  If you test positive for Covid 19 in the 2 weeks post procedure, please call and report this information to [place].     SIGNATURES/CONFIDENTIALITY: You and/or your care partner have signed paperwork which will be entered into your electronic medical record.  These signatures attest to the fact that that the information above on your After Visit Summary has been reviewed and is understood.  Full responsibility of the confidentiality of this discharge information lies with you and/or your care-partner.

## 2020-10-06 NOTE — Progress Notes (Signed)
A/ox3, pleased with MAC, report to RN 

## 2020-10-06 NOTE — Progress Notes (Signed)
The patient had formed stool in the rectum so colonoscopy was not performed.  He is supposed to be on MiraLAX twice a day regularly.  I will make sure that he is.  He was in the emergency department a few days ago with shortness of breath and high blood sugars and hypertension.  Given his overall situation I will put off colonoscopy and have him see me in the office in June but I think he should get back to the heart failure clinic and he has an appointment with Dr. Loanne Drilling of endocrinology on April 14.

## 2020-10-06 NOTE — Progress Notes (Signed)
Vitals by Calvin [name]. Pt's states no medical or surgical changes since previsit or office visit.

## 2020-10-06 NOTE — Progress Notes (Signed)
Unable to reach daughter by phone.  Not in lobby.  Ride on the way.  Discharged at this time.

## 2020-10-08 ENCOUNTER — Telehealth: Payer: Self-pay

## 2020-10-08 NOTE — Telephone Encounter (Signed)
  Follow up Call-  Call back number 10/06/2020  Post procedure Call Back phone  # [phone]  Permission to leave phone message Yes  Some recent data might be hidden     Patient questions:  Do you have a fever, pain , or abdominal swelling? No. Pain Score  0 *  Have you tolerated food without any problems? Yes.    Have you been able to return to your normal activities? Yes.    Do you have any questions about your discharge instructions: Diet   No. Medications  No. Follow up visit  No.  Do you have questions or concerns about your Care? No.  Actions: * If pain score is 4 or above: No action needed, pain <4.  1. Have you developed a fever since your procedure? no  2.   Have you had an respiratory symptoms (SOB or cough) since your procedure? no  3.   Have you tested positive for COVID 19 since your procedure no  4.   Have you had any family members/close contacts diagnosed with the COVID 19 since your procedure?  no   If yes to any of these questions please route to Joylene John, RN and Joella Prince, RN

## 2020-10-09 ENCOUNTER — Telehealth: Payer: Self-pay | Admitting: Neurology

## 2020-10-09 ENCOUNTER — Other Ambulatory Visit (HOSPITAL_COMMUNITY): Payer: Self-pay

## 2020-10-09 NOTE — Progress Notes (Signed)
Paramedicine Encounter    Patient ID: Calvin [name], male    DOB: 1960/11/21, 60 y.o.   MRN: [id number]   [name], NP as PCP - General (Geriatric Medicine) [name], MD as PCP - Cardiology (Cardiology) [name], [name], MD as PCP - Advanced Heart Failure (Cardiology) [name], MD as Consulting Physician (Endocrinology) Jon Billings, RN as [facility] [place], Connye Burkitt, LCSW as Social Worker (Licensed Clinical Social Worker)  Patient Active Problem List   Diagnosis Date Noted  . Adrenal nodule ([place]) 09/21/2020  . Intermittent claudication ([place]) 05/25/2020  . Hypoglycemia due to insulin 10/15/2019  . Chronic anticoagulation 10/15/2019  . Right sided weakness 06/17/2019  . Foot pain, bilateral 02/01/2019  . Overgrown toenails 12/20/2018  . Unsteady gait 12/20/2018  . Encounter for therapeutic drug monitoring 10/22/2018  . AF (paroxysmal atrial fibrillation) ([place]) 03/10/2018  . Diabetic neuropathy ([place]) 09/26/2017  . MCI (mild cognitive impairment) 06/23/2017  . OSA on CPAP 05/10/2017  . Gastroesophageal reflux disease 05/10/2017  . Insomnia 05/10/2017  . 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 Chronic systolic heart failure ([place]) 11/22/2015  . Essential hypertension 11/22/2015  . DM (diabetes mellitus) ([place]) 11/20/2015  . HLD (hyperlipidemia) 11/20/2015  . Hx of adenomatous colonic polyps 05/19/2011    Current Outpatient Medications:  .  albuterol (VENTOLIN HFA) 108 (90 Base) MCG/ACT inhaler, Inhale 2 puffs into the lungs every 6 (six) hours as needed for wheezing or shortness of breath., Disp: 6.7 g, Rfl: 0 .  amitriptyline (ELAVIL) 10 MG tablet, Take 2 tablets at bed time, Disp: 180 tablet, Rfl: 3 .  atorvastatin (LIPITOR) 80 MG tablet, Take 1 tablet (80 mg total) by mouth daily. (Patient taking differently: Take 80 mg by  mouth every evening.), Disp: 90 tablet, Rfl: 3 .  carvedilol (COREG) 6.25 MG tablet, Take 1 tablet (6.25 mg total) by mouth 2 (two) times daily with a meal., Disp: 180 tablet, Rfl: 3 .  dapagliflozin propanediol (FARXIGA) 10 MG TABS tablet, Take 1 tablet (10 mg total) by mouth daily before breakfast., Disp: 90 tablet, Rfl: 3 .  donepezil (ARICEPT) 10 MG tablet, Take 1 tablet (10 mg total) by mouth at bedtime., Disp: 90 tablet, Rfl: 3 .  ELIQUIS 5 MG TABS tablet, TAKE 1 TABLET BY MOUTH TWICE DAILY, Disp: 60 tablet, Rfl: 5 .  ezetimibe (ZETIA) 10 MG tablet, Take 1 tablet (10 mg total) by mouth daily., Disp: 90 tablet, Rfl: 2 .  furosemide (LASIX) 40 MG tablet, Take 1 tablet (40 mg total) by mouth daily., Disp: 30 tablet, Rfl: 11 .  insulin degludec (TRESIBA FLEXTOUCH) 200 UNIT/ML FlexTouch Pen, Inject 48 Units into the skin daily., Disp: 18 mL, Rfl: 3 .  Insulin Syringe-Needle U-100 (INSULIN SYRINGE .3CC/29GX1/2") 29G X 1/2" 0.3 ML MISC, Inject 1 Syringe 3 (three) times daily as directed. Check blood sugar three times daily. Dx: E11.9 (Patient taking differently: Inject 1 Syringe as directed See admin instructions. Check blood sugar three times daily. Dx: E11.9), Disp: 100 each, Rfl: 3 .  Lancets (ONETOUCH DELICA PLUS VHQION62X) MISC, USE TWICE DAILY, Disp: 200 each, Rfl: 3 .  lidocaine (LMX) 4 % cream, Apply 1 application topically 3 (three) times daily as needed., Disp: 30 g, Rfl: 0 .  losartan (COZAAR) 50 MG tablet, Take 1 tablet (50 mg total) by mouth daily., Disp: 90 tablet, Rfl: 3 .  nitroGLYCERIN (NITROSTAT) 0.4 MG  SL tablet, Place 1 tablet (0.4 mg total) under the tongue every 5 (five) minutes as needed for chest pain., Disp: 30 tablet, Rfl: 1 .  ONETOUCH ULTRA test strip, USE TWICE DAILY, Disp: 200 strip, Rfl: 3 .  polyethylene glycol (MIRALAX / GLYCOLAX) 17 g packet, Take 17 g by mouth 2 (two) times daily., Disp: 14 each, Rfl: 0 .  potassium chloride SA (KLOR-CON) 10 MEQ tablet, Take 1 tablet  (10 mEq total) by mouth daily., Disp: 30 tablet, Rfl: 11 .  REPATHA 140 MG/ML SOSY, INJECT 1 PEN INTO THE SKIN EVERY 14 DAYS, Disp: 6 mL, Rfl: 3 .  spironolactone (ALDACTONE) 25 MG tablet, Take 1 tablet (25 mg total) by mouth daily., Disp: 90 tablet, Rfl: 3 .  triamcinolone (KENALOG) 0.1 %, Apply 1 application topically 3 (three) times daily as needed. For itching, Disp: 45 g, Rfl: 1 No Known Allergies    Social History   Socioeconomic History  . Marital status: Single    Spouse name: Not on file  . Number of children: 0  . Years of education: Not on file  . Highest education level: Not on file  Occupational History  . Occupation: Disability  Tobacco Use  . Smoking status: Never Smoker  . Smokeless tobacco: Never Used  Vaping Use  . Vaping Use: Never used  Substance and Sexual Activity  . Alcohol use: No  . Drug use: No  . Sexual activity: Yes    Birth control/protection: None  Other Topics Concern  . Not on file  Social History Narrative   Social History   Diet?    Do you drink/eat things with caffeine? yes   Marital status?       single      Do you live in a house, apartment, assisted living, condo, trailer, etc.? yes   Is it one or more stories? One story   How many persons live in your home?   Do you have any pets in your home? (please list)    Highest level of education completed? graduate   Do you exercise?            no                          Type & how often?   Advanced Directives   Do you have a living will? no   Do you have a DNR form?                                  If not, do you want to discuss one? no   Do you have signed POA/HPOA for forms? no      Functional Status   Do you have difficulty bathing or dressing yourself? no   Do you have difficulty preparing food or eating? no   Do you have difficulty managing your medications? no   Do you have difficulty managing your finances? no   Do you have difficulty affording your medications? no   Social  Determinants of Health   Financial Resource Strain: Not on file  Food Insecurity: No Food Insecurity  . Worried About Charity fundraiser in the Last Year: Never true  . Ran Out of Food in the Last Year: Never true  Transportation Needs: Not on file  Physical Activity: Not on file  Stress: Not on file  Social Connections: Not  on file  Intimate Partner Violence: Not on file    Physical Exam      Future Appointments  Date Time Provider [place]  10/15/2020  1:00 PM [name], MD [facility] None  10/19/2020  8:15 AM [name], MD [facility] None  10/26/2020  9:00 AM Jon Billings, RN [facility] None  11/05/2020 11:00 AM [facility] PA/NP [facility] None  11/11/2020 12:15 PM Imogene Burn, [name] [facility]  12/04/2020  1:30 PM [name], MD [facility]  01/15/2021 11:00 AM [name], NP [facility] None    BP (!) 150/88   Pulse 88   Resp 18   SpO2 99%  CBG PTA-100 Weight yesterday-? Last visit weight-184 @ clinic   Pt reports he is doing about the same, just feels tired, poor appetite. he has not really noticed a difference since starting the lasix.  He still feels congested at times. He is on last week of his amitriptyline and donepazil-he needs to be seen at dr Delice Lesch office before they refill.  He will call to make appointment. Last refill sent of donepazil ws on 3/26 and he has 5 tablets left-he is not sure what happened as those were for 90 days.  Sob same. Dizziness same. Lungs clear. No edema noted. No c/p.  meds verified, will f/u next week to see if the amitriptyline will be refilled and he also needs the eliquis. Pharmacy will be delivering it to him.  CBG's are better, but not sure if these are too low for him-he may not be adjusting to lower CBG readings yet.   Marylouise Stacks, [facility] Paramedic  10/09/20

## 2020-10-09 NOTE — Telephone Encounter (Signed)
[facility] called to request patient's medications be sent to their pharmacy, patient is transferring to them. Notified caller that I would send the message back but that per Dr Aquino's last note, the patient needs to be seen at least once a year in order for her to continue to refill and that he doesn't have an appointment scheduled as of yet. Caller verbalized understanding and stated she would speak with patient.

## 2020-10-15 ENCOUNTER — Ambulatory Visit (INDEPENDENT_AMBULATORY_CARE_PROVIDER_SITE_OTHER): Payer: Medicare HMO | Admitting: Endocrinology

## 2020-10-15 ENCOUNTER — Encounter: Payer: Self-pay | Admitting: Endocrinology

## 2020-10-15 ENCOUNTER — Other Ambulatory Visit: Payer: Self-pay

## 2020-10-15 ENCOUNTER — Other Ambulatory Visit (HOSPITAL_COMMUNITY): Payer: Self-pay | Admitting: *Deleted

## 2020-10-15 VITALS — BP 138/90 | HR 90 | Ht 66.25 in | Wt 174.2 lb

## 2020-10-15 DIAGNOSIS — Z794 Long term (current) use of insulin: Secondary | ICD-10-CM

## 2020-10-15 DIAGNOSIS — E1140 Type 2 diabetes mellitus with diabetic neuropathy, unspecified: Secondary | ICD-10-CM

## 2020-10-15 LAB — POCT GLYCOSYLATED HEMOGLOBIN (HGB A1C): Hemoglobin A1C: 8.9 % — AB (ref 4.0–5.6)

## 2020-10-15 MED ORDER — TRESIBA FLEXTOUCH 200 UNIT/ML ~~LOC~~ SOPN: 52.0000 [IU] | PEN_INJECTOR | Freq: Every day | SUBCUTANEOUS | 3 refills | Status: DC

## 2020-10-15 MED ORDER — SPIRONOLACTONE 25 MG PO TABS: 25.0000 mg | ORAL_TABLET | Freq: Every day | ORAL | 3 refills | Status: DC

## 2020-10-15 MED ORDER — CARVEDILOL 6.25 MG PO TABS: 6.2500 mg | ORAL_TABLET | Freq: Two times a day (BID) | ORAL | 3 refills | Status: DC

## 2020-10-15 NOTE — Patient Instructions (Addendum)
Please increase the Tresiba to 52 units daily.   On this type of insulin, you should eat meals on a regular schedule.  If a meal is missed or significantly delayed, your blood sugar could go low.   check your blood sugar twice a day.  vary the time of day when you check, between before the 3 meals, and at bedtime.  also check if you have symptoms of your blood sugar being too high or too low.  please keep a record of the readings and bring it to your next appointment here (or you can bring the meter itself).  You can write it on any piece of paper.  please call us sooner if your blood sugar goes below 70, or if you have a lot of readings over 200.   Please come back for a follow-up appointment in 3 months.

## 2020-10-15 NOTE — Progress Notes (Signed)
[facility] Note  10/19/2020     CHIEF COMPLAINT Patient presents for Retina Follow Up   HISTORY OF PRESENT ILLNESS: Calvin [name] is a 60 y.o. male who presents to the clinic today for:  HPI    Retina Follow Up    Patient presents with  Diabetic Retinopathy.  In both eyes.  This started 4 weeks ago.  I, the attending physician,  performed the HPI with the patient and updated documentation appropriately.          Comments    Patient here for 4 weeks retina follow up for NPDR OU. Patient states vision about the same. No eye pain.        Last edited by [name], MD on 10/19/2020  8:10 AM. (History)    pt states vision is doing "great"   Referring physician: [name], NP [place],  [place] [address]  HISTORICAL INFORMATION:   Selected notes from the MEDICAL RECORD NUMBER Referred by Dr. [name] originally Re-referred by Dr. Darcey Nora ([facility]) for DM f/u Uncontrolled DM II on insulin A1C: 10.1   CURRENT MEDICATIONS: No current outpatient medications on file. (Ophthalmic Drugs)   No current facility-administered medications for this visit. (Ophthalmic Drugs)   Current Outpatient Medications (Other)  Medication Sig  . albuterol (VENTOLIN HFA) 108 (90 Base) MCG/ACT inhaler Inhale 2 puffs into the lungs every 6 (six) hours as needed for wheezing or shortness of breath.  [name] amitriptyline (ELAVIL) 10 MG tablet Take 2 tablets at bed time  . atorvastatin (LIPITOR) 80 MG tablet Take 1 tablet (80 mg total) by mouth daily. (Patient taking differently: Take 80 mg by mouth every evening.)  . carvedilol (COREG) 6.25 MG tablet Take 1 tablet (6.25 mg total) by mouth 2 (two) times daily with a meal.  . dapagliflozin propanediol (FARXIGA) 10 MG TABS tablet Take 1 tablet (10 mg total) by mouth daily before breakfast.  . donepezil (ARICEPT) 10 MG tablet Take 1 tablet (10 mg total) by mouth at bedtime.  [name] ELIQUIS 5 MG TABS  tablet TAKE 1 TABLET BY MOUTH TWICE DAILY  . ezetimibe (ZETIA) 10 MG tablet Take 1 tablet (10 mg total) by mouth daily.  . furosemide (LASIX) 40 MG tablet Take 1 tablet (40 mg total) by mouth daily.  . insulin degludec (TRESIBA FLEXTOUCH) 200 UNIT/ML FlexTouch Pen Inject 52 Units into the skin daily.  . Insulin Syringe-Needle U-100 (INSULIN SYRINGE .3CC/29GX1/2") 29G X 1/2" 0.3 ML MISC Inject 1 Syringe 3 (three) times daily as directed. Check blood sugar three times daily. Dx: E11.9 (Patient taking differently: Inject 1 Syringe as directed See admin instructions. Check blood sugar three times daily. Dx: E11.9)  . Lancets (ONETOUCH DELICA PLUS [id number]) MISC USE TWICE DAILY  . lidocaine (LMX) 4 % cream Apply 1 application topically 3 (three) times daily as needed.  [name] losartan (COZAAR) 50 MG tablet Take 1 tablet (50 mg total) by mouth daily.  . nitroGLYCERIN (NITROSTAT) 0.4 MG SL tablet Place 1 tablet (0.4 mg total) under the tongue every 5 (five) minutes as needed for chest pain.  [name] ULTRA test strip USE TWICE DAILY  . polyethylene glycol (MIRALAX / GLYCOLAX) 17 g packet Take 17 g by mouth 2 (two) times daily.  . potassium chloride SA (KLOR-CON) 10 MEQ tablet Take 1 tablet (10 mEq total) by mouth daily.  [name] REPATHA 140 MG/ML SOSY INJECT 1 PEN INTO THE SKIN EVERY 14  DAYS  . spironolactone (ALDACTONE) 25 MG tablet Take 1 tablet (25 mg total) by mouth daily.  [name] triamcinolone (KENALOG) 0.1 % Apply 1 application topically 3 (three) times daily as needed. For itching   No current facility-administered medications for this visit. (Other)      REVIEW OF SYSTEMS: ROS    Positive for: Gastrointestinal, Neurological, Musculoskeletal, Endocrine, Cardiovascular, Eyes, Respiratory   Negative for: Constitutional, Skin, Genitourinary, HENT, Psychiatric, Allergic/Imm, Heme/Lymph   Last edited by [name], COA on 10/19/2020  8:03 AM. (History)       ALLERGIES No Known Allergies  PAST  MEDICAL HISTORY Past Medical History:  Diagnosis Date  . Adenoidal hypertrophy   . Adenomatous colon polyps 2012  . AKI (acute kidney injury) ([place]) 06/17/2019  . Arthritis   . Cataract    Mixed OU  . COVID-19 virus detected 06/18/2019  . Diabetes mellitus   . Diabetic retinopathy ([place])    NPDR OU  . GERD (gastroesophageal reflux disease)   . Heart attack ([place])    While living in [place]  [name] Hx of adenomatous colonic polyps 08/18/2017  . Hyperlipidemia   . Hypertension   . Hypertensive retinopathy    OU  . Mild CAD    a. cath in 08/2017 showing mild nonobstructive CAD with scattered 20-30% stenosis.   . Nonischemic cardiomyopathy ([place])    a. EF 20-25% by echo in 09/2017 with cath showing mild CAD. b.  Last echo 12/2017 EF 35-40%, grade 2 DD.  [name] Obesity   . PAF (paroxysmal atrial fibrillation) ([place])   . Sleep apnea    cpap, pt says no longer has   Past Surgical History:  Procedure Laterality Date  . COLONOSCOPY  05/13/11, 08/2017   9 adenomas  . FOREARM SURGERY    . MUSCLE BIOPSY    . POLYPECTOMY    . RIGHT/LEFT HEART CATH AND CORONARY ANGIOGRAPHY N/A 08/25/2017   Procedure: RIGHT/LEFT HEART CATH AND CORONARY ANGIOGRAPHY;  Surgeon: [name], MD;  Location: [facility];  Service: Cardiovascular;  Laterality: N/A;  . RIGHT/LEFT HEART CATH AND CORONARY ANGIOGRAPHY N/A 10/01/2019   Procedure: RIGHT/LEFT HEART CATH AND CORONARY ANGIOGRAPHY;  Surgeon: [name], MD;  Location: [facility];  Service: Cardiovascular;  Laterality: N/A;    FAMILY HISTORY Family History  Problem Relation Age of Onset  . Heart disease Mother   . Heart attack Mother 79  . Hypertension Mother   . Hyperlipidemia Mother   . Diabetes Mother   . Heart disease Father   . Heart attack Father 50  . Hypertension Father   . Hyperlipidemia Father   . Diabetes Father   . Heart attack Sister 61  . Colon cancer Neg Hx   . Stomach cancer Neg Hx   . Esophageal cancer Neg Hx    . Rectal cancer Neg Hx   . Colon polyps Neg Hx     SOCIAL HISTORY Social History   Tobacco Use  . Smoking status: Never Smoker  . Smokeless tobacco: Never Used  Vaping Use  . Vaping Use: Never used  Substance Use Topics  . Alcohol use: No  . Drug use: No         OPHTHALMIC EXAM:  Base Eye Exam    Visual Acuity (Snellen - Linear)      Right Left   Dist [place] 20/20 -1 20/30   Dist ph [place]  NI       Tonometry (Tonopen, 8:01  AM)      Right Left   Pressure 20 17       Pupils      Dark Light Shape React APD   Right 3 2 Round Brisk None   Left 3 2 Round Brisk None       Visual Fields (Counting fingers)      Left Right    Full Full       Extraocular Movement      Right Left    Full, Ortho Full, Ortho       Neuro/Psych    Oriented x3: Yes   Mood/Affect: Normal       Dilation    Both eyes: 1.0% Mydriacyl, 2.5% Phenylephrine @ 8:01 AM        Slit Lamp and Fundus Exam    Slit Lamp Exam      Right Left   Lids/Lashes Dermatochalasis - upper lid, Meibomian gland dysfunction Dermatochalasis - upper lid, Meibomian gland dysfunction   Conjunctiva/Sclera nasal and temporal pinguecula, Melanosis nasal and temporal pinguecula, Melanosis   Cornea arcus, 2-3+ Punctate epithelial erosions, tear film debris arcus, 2+ inferior Punctate epithelial erosions   Anterior Chamber deep, clear, narrow temporal angle Deep and quiet   Iris Round and dilated, No NVI Round and dilated, No NVI   Lens 2-3+ Nuclear sclerosis, 2-3+ Cortical cataract 2-3+ Nuclear sclerosis, 2-3+ Cortical cataract   Vitreous Vitreous syneresis Vitreous syneresis       Fundus Exam      Right Left   Disc Pink and Sharp, no NVD Pink and Sharp, no NVD, Compact   C/D Ratio 0.3 0.2   Macula Blunted foveal reflex, mild improvement in central edema, scattered MA/IRH -- improving, extensive exudates - slightly improved Blunted foveal reflex, central edema with severe exudates -- mild interval improvement,  scattered IRH   Vessels attenuated, Tortuous, ST venule dilated attenuated, Tortuous   Periphery Attached, scattered IRH/DBH, exudates greatest posteriorly and nasal to disc Attached, scattered IRH/DBH, exudates greatest posteriorly             IMAGING AND PROCEDURES  Imaging and Procedures for 10/19/2020  OCT, Retina - OU - Both Eyes       Right Eye Quality was good. Central Foveal Thickness: 322. Progression has improved. Findings include abnormal foveal contour, intraretinal fluid, intraretinal hyper-reflective material, subretinal hyper-reflective material, vitreomacular adhesion , no SRF (Mild Interval improvement in IRF).   Left Eye Quality was good. Central Foveal Thickness: 293. Progression has improved. Findings include abnormal foveal contour, intraretinal fluid, intraretinal hyper-reflective material, no SRF (Interval improvement in IRF ).   Notes *Images captured and stored on drive  Diagnosis / Impression:  Central DME OU (OS > OD) OD: Mild Interval improvement in IRF OS: Interval improvement in IRF   Clinical management:  See below  Abbreviations: NFP - Normal foveal profile. CME - cystoid macular edema. PED - pigment epithelial detachment. IRF - intraretinal fluid. SRF - subretinal fluid. EZ - ellipsoid zone. ERM - epiretinal membrane. ORA - outer retinal atrophy. ORT - outer retinal tubulation. SRHM - subretinal hyper-reflective material. IRHM - intraretinal hyper-reflective material        Intravitreal Injection, Pharmacologic Agent - OD - Right Eye       Time Out 10/19/2020. 8:06 AM. Confirmed correct patient, procedure, site, and patient consented.   Anesthesia Topical anesthesia was used. Anesthetic medications included Lidocaine 2%, Proparacaine 0.5%.   Procedure Preparation included 5% betadine to ocular surface, eyelid speculum. A supplied needle  was used.   Injection:  1.25 mg Bevacizumab (AVASTIN) 1.44m/0.05mL SOLN   NDC: 549702-637-85 Lot:  [id number] , Expiration date: 11/15/2020   Route: Intravitreal, Site: Right Eye, Waste: 0 mL  Post-op Post injection exam found visual acuity of at least counting fingers. The patient tolerated the procedure well. There were no complications. The patient received written and verbal post procedure care education. Post injection medications were not given.        Intravitreal Injection, Pharmacologic Agent - OS - Left Eye       Time Out 10/19/2020. 8:07 AM. Confirmed correct patient, procedure, site, and patient consented.   Anesthesia Topical anesthesia was used. Anesthetic medications included Lidocaine 2%, Proparacaine 0.5%.   Procedure Preparation included 5% betadine to ocular surface, eyelid speculum. A (32g) needle was used.   Injection:  1.25 mg Bevacizumab (AVASTIN) 1.239m0.05mL SOLN   NDC: 5030mLot: [id number] date: 11/22/2020   Route: Intravitreal, Site: Left Eye, Waste: 0.05 mL  Post-op Post injection exam found visual acuity of at least counting fingers. The patient tolerated the procedure well. There were no complications. The patient received written and verbal post procedure care education. Post injection medications were not given.                 ASSESSMENT/PLAN:    ICD-10-CM   1. Severe nonproliferative diabetic retinopathy of both eyes with macular edema associated with type 2 diabetes mellitus (HCC)  E16/4/2022ntravitreal Injection, Pharmacologic Agent - OD - Right Eye    Intravitreal Injection, Pharmacologic Agent - OS - Left Eye    Bevacizumab (AVASTIN) SOLN 1.25 mg    Bevacizumab (AVASTIN) SOLN 1.25 mg  2. Retinal edema  H35.81 OCT, Retina - OU - Both Eyes  3. Essential hypertension  I10   4. Hypertensive retinopathy of both eyes  H35.033   5. Combined forms of age-related cataract of both eyes  H25.813     1,2. Severe Non-proliferative diabetic retinopathy w/ DME, OU  - pt was delayed from 4 weeks to 9 weeks (12.14.21 to  2.21.22) due to financial / insurance reasons  - A1c 8.4% on 02.08.22 -- steady improvement since July 2021 (10.1%)  - s/p IVA OS #1 (09.16.21), IVA OD #1 (09.21.21)             - s/p IVA OU #2 (10.19.21), #3 (11.16.21), #4 (12.14.21), #5 (02.21.22), #6 (03.21.22) - exam shows central edema OU, scattered IRH and severe exudates OU -- improving - FA (09.16.21) shows late leaking MA greatest posterior pole; no NV; +peripheral vascular perfusion defects -- may benefit from peripheral PRP OU  - OCT shows OD: Mild Interval improvement in IRF; OS: Interval improvement in IRF  - recommend IVA OU #7 today, 04.18.22  - Avastin informed consent obtained, signed and scanned OD, 09.21.21 - Avastin informed consent obtained, signed and scanned, 09.16.21 (OS) - f/u 5 weeks, DFE, OCT, possible injection(s)  3,4. Hypertensive retinopathy OU - discussed importance of tight BP control - monitor  5. Mixed Cataract OU - The symptoms of cataract, surgical options, and treatments and risks were discussed with patient. - discussed diagnosis and progression - visually significant -- will refer for cat eval once DR more stable   Ophthalmic Meds Ordered this visit:  Meds ordered this encounter  Medications  . Bevacizumab (AVASTIN) SOLN 1.25 mg  . Bevacizumab (AVASTIN) SOLN 1.25 mg       Return in about 5 weeks (around 11/23/2020) for f/u NPDR OU, DFE, OCT.  There  are no Patient Instructions on file for this visit.  This document serves as a record of services personally performed by Gardiner Sleeper, MD, PhD. It was created on their behalf by Leeann Must, [place], an ophthalmic technician. The creation of this record is the provider's dictation and/or activities during the visit.    Electronically signed by: Leeann Must, COA @TODAY @ 8:59 AM   This document serves as a record of services personally performed by Gardiner Sleeper, MD, PhD. It was created on their behalf by San Jetty. Owens Shark, OA an ophthalmic  technician. The creation of this record is the provider's dictation and/or activities during the visit.    Electronically signed by: San Jetty. Owens Shark, [place] 04.18.2022 8:59 AM  Gardiner Sleeper, M.D., Ph.D. Diseases & Surgery of the Retina and [place] 10/19/2020   I have reviewed the above documentation for accuracy and completeness, and I agree with the above. Gardiner Sleeper, M.D., Ph.D. 10/19/20 8:59 AM   Abbreviations: M myopia (nearsighted); A astigmatism; H hyperopia (farsighted); P presbyopia; Mrx spectacle prescription;  CTL contact lenses; OD right eye; OS left eye; OU both eyes  XT exotropia; ET esotropia; PEK punctate epithelial keratitis; PEE punctate epithelial erosions; DES dry eye syndrome; MGD meibomian gland dysfunction; ATs artificial tears; PFAT's preservative free artificial tears; [place] nuclear sclerotic cataract; PSC posterior subcapsular cataract; ERM epi-retinal membrane; PVD posterior vitreous detachment; RD retinal detachment; DM diabetes mellitus; DR diabetic retinopathy; NPDR non-proliferative diabetic retinopathy; PDR proliferative diabetic retinopathy; CSME clinically significant macular edema; DME diabetic macular edema; dbh dot blot hemorrhages; CWS cotton wool spot; POAG primary open angle glaucoma; C/D cup-to-disc ratio; HVF humphrey visual field; GVF goldmann visual field; OCT optical coherence tomography; IOP intraocular pressure; BRVO Branch retinal vein occlusion; CRVO central retinal vein occlusion; CRAO central retinal artery occlusion; BRAO branch retinal artery occlusion; RT retinal tear; SB scleral buckle; PPV pars plana vitrectomy; VH Vitreous hemorrhage; PRP panretinal laser photocoagulation; IVK intravitreal kenalog; VMT vitreomacular traction; MH Macular hole;  NVD neovascularization of the disc; NVE neovascularization elsewhere; AREDS age related eye disease study; ARMD age related macular degeneration; POAG primary open angle  glaucoma; EBMD epithelial/anterior basement membrane dystrophy; ACIOL anterior chamber intraocular lens; IOL intraocular lens; PCIOL posterior chamber intraocular lens; Phaco/IOL phacoemulsification with intraocular lens placement; [place] photorefractive keratectomy; LASIK laser assisted in situ keratomileusis; HTN hypertension; DM diabetes mellitus; COPD chronic obstructive pulmonary disease

## 2020-10-15 NOTE — Progress Notes (Signed)
Subjective:    Patient ID: Calvin [name], male    DOB: [birth year], 60 y.o.   MRN: [id number]  HPI Pt returns for f/u of diabetes mellitus:  DM type: Insulin-requiring type 2. Dx'ed: 3500 Complications: PN, CAD, CRI, and DR.   Therapy: insulin since soon after dx, and Farxiga.    DKA: never Severe hypoglycemia: once, in 2021.   Pancreatitis: never Pancreatic imaging: normal on 2003 CT. SDOH: due to noncompliance, he is not a candidate for multiple daily injections; he is retired; he gets insulin from pt assistance; changes between brands must be minimized, due to LHL.   Other: up to early 2021, he was taking more than 200 units QD; pt requests that insulin be titrated up very slowly, due to h/o severe hypoglycemia.    Interval history: no cbg record, but states cbg's vary from 120-170.  He says he takes insulin as rx'ed.   Past Medical History:  Diagnosis Date  . Adenoidal hypertrophy   . Adenomatous colon polyps 2012  . AKI (acute kidney injury) ([place]) 06/17/2019  . Arthritis   . Cataract    Mixed OU  . COVID-19 virus detected 06/18/2019  . Diabetes mellitus   . Diabetic retinopathy ([place])    NPDR OU  . GERD (gastroesophageal reflux disease)   . Heart attack ([place])    [place]  [name] Hx of adenomatous colonic polyps 08/18/2017  . Hyperlipidemia   . Hypertension   . Hypertensive retinopathy    OU  . Mild CAD    a. cath in 08/2017 showing mild nonobstructive CAD with scattered 20-30% stenosis.   . Nonischemic cardiomyopathy ([place])    a. EF 20-25% by echo in 09/2017 with cath showing mild CAD. b.  Last echo 12/2017 EF 35-40%, grade 2 DD.  [name] Obesity   . PAF (paroxysmal atrial fibrillation) ([place])   . Sleep apnea    cpap, pt says no longer has    Past Surgical History:  Procedure Laterality Date  . COLONOSCOPY  05/13/11, 08/2017   9 adenomas  . FOREARM SURGERY    . MUSCLE BIOPSY    . POLYPECTOMY    . RIGHT/LEFT HEART CATH AND CORONARY ANGIOGRAPHY N/A 08/25/2017    Procedure: RIGHT/LEFT HEART CATH AND CORONARY ANGIOGRAPHY;  Surgeon: [name], MD;  Location: [facility];  Service: Cardiovascular;  Laterality: N/A;  . RIGHT/LEFT HEART CATH AND CORONARY ANGIOGRAPHY N/A 10/01/2019   Procedure: RIGHT/LEFT HEART CATH AND CORONARY ANGIOGRAPHY;  Surgeon: [name], MD;  Location: [facility];  Service: Cardiovascular;  Laterality: N/A;    Social History   Socioeconomic History  . Marital status: Single    Spouse name: Not on file  . Number of children: 0  . Years of education: Not on file  . Highest education level: Not on file  Occupational History  . Occupation: Disability  Tobacco Use  . Smoking status: Never Smoker  . Smokeless tobacco: Never Used  Vaping Use  . Vaping Use: Never used  Substance and Sexual Activity  . Alcohol use: No  . Drug use: No  . Sexual activity: Yes    Birth control/protection: None  Other Topics Concern  . Not on file  Social History Narrative   Social History   Diet?    Do you drink/eat things with caffeine? yes   Marital status?       single      Do you live in a house, apartment,  assisted living, condo, trailer, etc.? yes   Is it one or more stories? One story   How many persons live in your home?   Do you have any pets in your home? (please list)    Highest level of education completed? graduate   Do you exercise?            no                          Type & how often?   Advanced Directives   Do you have a living will? no   Do you have a DNR form?                                  If not, do you want to discuss one? no   Do you have signed POA/HPOA for forms? no      Functional Status   Do you have difficulty bathing or dressing yourself? no   Do you have difficulty preparing food or eating? no   Do you have difficulty managing your medications? no   Do you have difficulty managing your finances? no   Do you have difficulty affording your medications? no   Social  Determinants of Health   Financial Resource Strain: Not on file  Food Insecurity: No Food Insecurity  . Worried About Charity fundraiser in the Last Year: Never true  . Ran Out of Food in the Last Year: Never true  Transportation Needs: Not on file  Physical Activity: Not on file  Stress: Not on file  Social Connections: Not on file  Intimate Partner Violence: Not on file    Current Outpatient Medications on File Prior to Visit  Medication Sig Dispense Refill  . albuterol (VENTOLIN HFA) 108 (90 Base) MCG/ACT inhaler Inhale 2 puffs into the lungs every 6 (six) hours as needed for wheezing or shortness of breath. 6.7 g 0  . amitriptyline (ELAVIL) 10 MG tablet Take 2 tablets at bed time 180 tablet 3  . atorvastatin (LIPITOR) 80 MG tablet Take 1 tablet (80 mg total) by mouth daily. (Patient taking differently: Take 80 mg by mouth every evening.) 90 tablet 3  . dapagliflozin propanediol (FARXIGA) 10 MG TABS tablet Take 1 tablet (10 mg total) by mouth daily before breakfast. 90 tablet 3  . donepezil (ARICEPT) 10 MG tablet Take 1 tablet (10 mg total) by mouth at bedtime. 90 tablet 3  . ELIQUIS 5 MG TABS tablet TAKE 1 TABLET BY MOUTH TWICE DAILY 60 tablet 5  . ezetimibe (ZETIA) 10 MG tablet Take 1 tablet (10 mg total) by mouth daily. 90 tablet 2  . furosemide (LASIX) 40 MG tablet Take 1 tablet (40 mg total) by mouth daily. 30 tablet 11  . Insulin Syringe-Needle U-100 (INSULIN SYRINGE .3CC/29GX1/2") 29G X 1/2" 0.3 ML MISC Inject 1 Syringe 3 (three) times daily as directed. Check blood sugar three times daily. Dx: E11.9 (Patient taking differently: Inject 1 Syringe as directed See admin instructions. Check blood sugar three times daily. Dx: E11.9) 100 each 3  . Lancets (ONETOUCH DELICA PLUS [id number]) MISC USE TWICE DAILY 200 each 3  . lidocaine (LMX) 4 % cream Apply 1 application topically 3 (three) times daily as needed. 30 g 0  . losartan (COZAAR) 50 MG tablet Take 1 tablet (50 mg total) by mouth  daily. 90 tablet 3  .  nitroGLYCERIN (NITROSTAT) 0.4 MG SL tablet Place 1 tablet (0.4 mg total) under the tongue every 5 (five) minutes as needed for chest pain. 30 tablet 1  . ONETOUCH ULTRA test strip USE TWICE DAILY 200 strip 3  . polyethylene glycol (MIRALAX / GLYCOLAX) 17 g packet Take 17 g by mouth 2 (two) times daily. 14 each 0  . potassium chloride SA (KLOR-CON) 10 MEQ tablet Take 1 tablet (10 mEq total) by mouth daily. 30 tablet 11  . REPATHA 140 MG/ML SOSY INJECT 1 PEN INTO THE SKIN EVERY 14 DAYS 6 mL 3  . triamcinolone (KENALOG) 0.1 % Apply 1 application topically 3 (three) times daily as needed. For itching 45 g 1   No current facility-administered medications on file prior to visit.    No Known Allergies  Family History  Problem Relation Age of Onset  . Heart disease Mother   . Heart attack Mother 48  . Hypertension Mother   . Hyperlipidemia Mother   . Diabetes Mother   . Heart disease Father   . Heart attack Father 76  . Hypertension Father   . Hyperlipidemia Father   . Diabetes Father   . Heart attack Sister 43  . Colon cancer Neg Hx   . Stomach cancer Neg Hx   . Esophageal cancer Neg Hx   . Rectal cancer Neg Hx   . Colon polyps Neg Hx     BP 138/90 (BP Location: Right Arm, Patient Position: Sitting, Cuff Size: Normal)   Pulse 90   Ht 5' 6.25" (1.683 m)   Wt 174 lb 3.2 oz (79 kg)   SpO2 97%   BMI 27.90 kg/m    Review of Systems He denies hypoglycemia.      Objective:   Physical Exam VITAL SIGNS:  See vs page GENERAL: no distress Pulses: dorsalis pedis intact bilat.   MSK: no deformity of the feet CV: no leg edema Skin:  no ulcer on the feet.  normal color and temp on the feet.  Neuro: sensation is intact to touch on the feet.    Lab Results  Component Value Date   CREATININE 1.68 (H) 09/26/2020   BUN 17 09/26/2020   NA 136 09/26/2020   K 3.8 09/26/2020   CL 101 09/26/2020   CO2 28 09/26/2020   A1c=8.9%    Assessment & Plan:   Insulin-requiring type 2 DM: uncontrolled  Patient Instructions  Please increase the Tresiba to 52 units daily.   On this type of insulin, you should eat meals on a regular schedule.  If a meal is missed or significantly delayed, your blood sugar could go low.   check your blood sugar twice a day.  vary the time of day when you check, between before the 3 meals, and at bedtime.  also check if you have symptoms of your blood sugar being too high or too low.  please keep a record of the readings and bring it to your next appointment here (or you can bring the meter itself).  You can write it on any piece of paper.  please call us sooner if your blood sugar goes below 70, or if you have a lot of readings over 200.   Please come back for a follow-up appointment in 3 months.

## 2020-10-19 ENCOUNTER — Other Ambulatory Visit: Payer: Self-pay

## 2020-10-19 ENCOUNTER — Ambulatory Visit (INDEPENDENT_AMBULATORY_CARE_PROVIDER_SITE_OTHER): Payer: Medicare HMO | Admitting: Ophthalmology

## 2020-10-19 ENCOUNTER — Other Ambulatory Visit (HOSPITAL_COMMUNITY): Payer: Self-pay

## 2020-10-19 ENCOUNTER — Encounter (INDEPENDENT_AMBULATORY_CARE_PROVIDER_SITE_OTHER): Payer: Self-pay | Admitting: Ophthalmology

## 2020-10-19 DIAGNOSIS — H3581 Retinal edema: Secondary | ICD-10-CM

## 2020-10-19 DIAGNOSIS — E113413 Type 2 diabetes mellitus with severe nonproliferative diabetic retinopathy with macular edema, bilateral: Secondary | ICD-10-CM | POA: Diagnosis not present

## 2020-10-19 DIAGNOSIS — I10 Essential (primary) hypertension: Secondary | ICD-10-CM | POA: Diagnosis not present

## 2020-10-19 DIAGNOSIS — H25813 Combined forms of age-related cataract, bilateral: Secondary | ICD-10-CM | POA: Diagnosis not present

## 2020-10-19 DIAGNOSIS — H35033 Hypertensive retinopathy, bilateral: Secondary | ICD-10-CM | POA: Diagnosis not present

## 2020-10-19 MED ORDER — BEVACIZUMAB CHEMO INJECTION 1.25MG/0.05ML SYRINGE FOR KALEIDOSCOPE
1.2500 mg | INTRAVITREAL | Status: AC | PRN
  Administered 2020-10-19: 1.25 mg via INTRAVITREAL

## 2020-10-19 NOTE — Progress Notes (Signed)
Paramedicine Encounter    Patient ID: Calvin [name], male    DOB: 23-Apr-1961, 60 y.o.   MRN: [id number]   [name], NP as PCP - General (Geriatric Medicine) [name], MD as PCP - Cardiology (Cardiology) [name], [name], MD as PCP - Advanced Heart Failure (Cardiology) [name], MD as Consulting Physician (Endocrinology) Jon Billings, RN as [place] [facility] Uris, Connye Burkitt, LCSW as Social Worker (Licensed Clinical Social Worker)  Patient Active Problem List   Diagnosis Date Noted  . Adrenal nodule ([place]) 09/21/2020  . Intermittent claudication ([place]) 05/25/2020  . Hypoglycemia due to insulin 10/15/2019  . Chronic anticoagulation 10/15/2019  . Right sided weakness 06/17/2019  . Foot pain, bilateral 02/01/2019  . Overgrown toenails 12/20/2018  . Unsteady gait 12/20/2018  . Encounter for therapeutic drug monitoring 10/22/2018  . AF (paroxysmal atrial fibrillation) ([place]) 03/10/2018  . Diabetic neuropathy ([place]) 09/26/2017  . MCI (mild cognitive impairment) 06/23/2017  . OSA on CPAP 05/10/2017  . Gastroesophageal reflux disease 05/10/2017  . Insomnia 05/10/2017  . Onychomycosis of multiple toenails with type 2 diabetes mellitus and peripheral neuropathy ([place]) 05/10/2017  . Chronic systolic heart failure ([place]) 11/22/2015  . Essential hypertension 11/22/2015  . DM (diabetes mellitus) ([place]) 11/20/2015  . HLD (hyperlipidemia) 11/20/2015  . Hx of adenomatous colonic polyps 05/19/2011    Current Outpatient Medications:  .  albuterol (VENTOLIN HFA) 108 (90 Base) MCG/ACT inhaler, Inhale 2 puffs into the lungs every 6 (six) hours as needed for wheezing or shortness of breath., Disp: 6.7 g, Rfl: 0 .  atorvastatin (LIPITOR) 80 MG tablet, Take 1 tablet (80 mg total) by mouth daily. (Patient taking differently: Take 80 mg by mouth every evening.), Disp: 90 tablet, Rfl: 3 .  carvedilol (COREG) 6.25 MG tablet, Take 1  tablet (6.25 mg total) by mouth 2 (two) times daily with a meal., Disp: 180 tablet, Rfl: 3 .  dapagliflozin propanediol (FARXIGA) 10 MG TABS tablet, Take 1 tablet (10 mg total) by mouth daily before breakfast., Disp: 90 tablet, Rfl: 3 .  ELIQUIS 5 MG TABS tablet, TAKE 1 TABLET BY MOUTH TWICE DAILY, Disp: 60 tablet, Rfl: 5 .  ezetimibe (ZETIA) 10 MG tablet, Take 1 tablet (10 mg total) by mouth daily., Disp: 90 tablet, Rfl: 2 .  furosemide (LASIX) 40 MG tablet, Take 1 tablet (40 mg total) by mouth daily., Disp: 30 tablet, Rfl: 11 .  insulin degludec (TRESIBA FLEXTOUCH) 200 UNIT/ML FlexTouch Pen, Inject 52 Units into the skin daily., Disp: 18 mL, Rfl: 3 .  Insulin Syringe-Needle U-100 (INSULIN SYRINGE .3CC/29GX1/2") 29G X 1/2" 0.3 ML MISC, Inject 1 Syringe 3 (three) times daily as directed. Check blood sugar three times daily. Dx: E11.9 (Patient taking differently: Inject 1 Syringe as directed See admin instructions. Check blood sugar three times daily. Dx: E11.9), Disp: 100 each, Rfl: 3 .  Lancets (ONETOUCH DELICA PLUS [id number]) MISC, USE TWICE DAILY, Disp: 200 each, Rfl: 3 .  lidocaine (LMX) 4 % cream, Apply 1 application topically 3 (three) times daily as needed., Disp: 30 g, Rfl: 0 .  losartan (COZAAR) 50 MG tablet, Take 1 tablet (50 mg total) by mouth daily. (Patient taking differently: Take 50 mg by mouth at bedtime.), Disp: 90 tablet, Rfl: 3 .  ONETOUCH ULTRA test strip, USE TWICE DAILY, Disp: 200 strip, Rfl: 3 .  polyethylene glycol (MIRALAX / GLYCOLAX) 17 g packet, Take 17 g by mouth 2 (two) times daily., Disp: 14 each,  Rfl: 0 .  potassium chloride SA (KLOR-CON) 10 MEQ tablet, Take 1 tablet (10 mEq total) by mouth daily., Disp: 30 tablet, Rfl: 11 .  REPATHA 140 MG/ML SOSY, INJECT 1 PEN INTO THE SKIN EVERY 14 DAYS, Disp: 6 mL, Rfl: 3 .  spironolactone (ALDACTONE) 25 MG tablet, Take 1 tablet (25 mg total) by mouth daily., Disp: 90 tablet, Rfl: 3 .  triamcinolone (KENALOG) 0.1 %, Apply 1  application topically 3 (three) times daily as needed. For itching, Disp: 45 g, Rfl: 1 .  amitriptyline (ELAVIL) 10 MG tablet, Take 2 tablets at bed time, Disp: 180 tablet, Rfl: 3 .  donepezil (ARICEPT) 10 MG tablet, Take 1 tablet (10 mg total) by mouth at bedtime., Disp: 90 tablet, Rfl: 3 .  nitroGLYCERIN (NITROSTAT) 0.4 MG SL tablet, Place 1 tablet (0.4 mg total) under the tongue every 5 (five) minutes as needed for chest pain. (Patient not taking: Reported on 10/19/2020), Disp: 30 tablet, Rfl: 1 No Known Allergies    Social History   Socioeconomic History  . Marital status: Single    Spouse name: Not on file  . Number of children: 0  . Years of education: Not on file  . Highest education level: Not on file  Occupational History  . Occupation: Disability  Tobacco Use  . Smoking status: Never Smoker  . Smokeless tobacco: Never Used  Vaping Use  . Vaping Use: Never used  Substance and Sexual Activity  . Alcohol use: No  . Drug use: No  . Sexual activity: Yes    Birth control/protection: None  Other Topics Concern  . Not on file  Social History Narrative   Social History   Diet?    Do you drink/eat things with caffeine? yes   Marital status?       single      Do you live in a house, apartment, assisted living, condo, trailer, etc.? yes   Is it one or more stories? One story   How many persons live in your home?   Do you have any pets in your home? (please list)    Highest level of education completed? graduate   Do you exercise?            no                          Type & how often?   Advanced Directives   Do you have a living will? no   Do you have a DNR form?                                  If not, do you want to discuss one? no   Do you have signed POA/HPOA for forms? no      Functional Status   Do you have difficulty bathing or dressing yourself? no   Do you have difficulty preparing food or eating? no   Do you have difficulty managing your medications? no   Do  you have difficulty managing your finances? no   Do you have difficulty affording your medications? no   Social Determinants of Health   Financial Resource Strain: Not on file  Food Insecurity: No Food Insecurity  . Worried About Charity fundraiser in the Last Year: Never true  . Ran Out of Food in the Last Year: Never true  Transportation Needs: Not on  file  Physical Activity: Not on file  Stress: Not on file  Social Connections: Not on file  Intimate Partner Violence: Not on file    Physical Exam      Future Appointments  Date Time Provider [place]  10/26/2020  9:00 AM Jon Billings, RN [facility] None  11/05/2020 11:00 AM [facility] PA/NP [facility] None  11/11/2020 12:15 PM Imogene Burn, [name] [facility]  11/24/2020  2:30 PM [name], MD [facility] None  12/04/2020  1:30 PM [name], MD [facility]  01/14/2021  1:00 PM [name], MD [facility] None  01/15/2021 11:00 AM [name], NP PSC-PSC None  07/08/2021  8:30 AM Cameron Sprang, MD LBN-LBNG None    BP 138/84   Pulse 88   Resp 18   Wt 178 lb (80.7 kg)   SpO2 98%   BMI 28.51 kg/m   Weight yesterday-? Last visit weight-184 @ clinic  Pt reports he is doing good. He is about to move in new place at [facility] this week.  The amitriptyline and aricept will be refilled once he sees neuro doctor again. So I told him to set that appoint up.  He is good on his med refills for another 2 months or so, so his bubble packs wont be ready to start until then, he does need the eliquis and they will send it out today. He will place it in pill boxes.  I filled up a month supply for him now.  He actually called neuro during visit to sch that. So they did not have anything until January of next year but placed him on the cancellation list.   He reports his breathing has improved, cough has went away. No more congestion.  Denies c/p, no increased dizziness. Will have to ask his PCP to take  over those 2 rx until he can see his neuro doc next year. --sent note over to CMA at his geriatric provider asking for those refills.   Marylouise Stacks, [facility] Paramedic  10/19/20

## 2020-10-26 ENCOUNTER — Other Ambulatory Visit: Payer: Self-pay

## 2020-10-26 NOTE — Patient Outreach (Signed)
[facility]) Care Management  10/26/2020  Calvin [name] 12-10-1960 [id number]  Telephone call to patient for disease management follow up.   No answer.  HIPAA compliant voice message left.    Plan: If no return call, RN CM will attempt patient again in the month of June.  Jone Baseman, RN, MSN [place] [facility] Care Management Coordinator Direct Line [phone] Cell [phone] Toll Free: [phone]  Fax: [phone]

## 2020-10-29 ENCOUNTER — Telehealth: Payer: Self-pay

## 2020-10-29 NOTE — Telephone Encounter (Signed)
Calvin Chandler, NP  Carroll Kinds, CMA We will just stop medications at this time, (please put this in as a telephone encounter so we will know at next follow up)  Thank you.        Previous Messages   ----- Message -----  From: Carroll Kinds, CMA  Sent: 10/28/2020  2:36 PM EDT  To: Calvin Chandler, NP  Subject: RE: rx                      Patient has not had any headaches since being off medication  ----- Message -----  From: Calvin Chandler, NP  Sent: 10/22/2020  4:34 PM EDT  To: Carroll Kinds, CMA  Subject: RE: rx                      Is he having any increase in headaches since being off medication.   ----- Message -----  From: Carroll Kinds, CMA  Sent: 10/22/2020  4:32 PM EDT  To: Calvin Chandler, NP  Subject: RE: rx                      Spoke with patient he said that he ran out of both medications last month. Both were filled in Feb. Last year with 90 day supply and 1 refill on both  ----- Message -----  From: Calvin Chandler, NP  Sent: 10/22/2020  3:34 PM EDT  To: Carroll Kinds, CMA  Subject: RE: rx                      He was prescribed amitriptyline for his daily headaches back in 2020, has he been out of this or is he still taking  Aricept is to preserve memory, is he out of this already?   ----- Message -----  From: Carroll Kinds, CMA  Sent: 10/22/2020  3:28 PM EDT  To: Calvin Chandler, NP  Subject: FW: rx

## 2020-10-29 NOTE — Telephone Encounter (Signed)
Amitriptyline will be stoppred at this time per Sherrie Mustache, NP. See previous notes.

## 2020-11-03 NOTE — Progress Notes (Signed)
Cardiology Office Note    Date:  11/11/2020   ID:  Calvin [name], DOB 1960-08-09, MRN [id number]   PCP:  [name], NP   [facility]  Cardiologist:  [name], MD  Advanced Practice Provider:  No care team member to display Electrophysiologist:  None   [phone]   Chief Complaint  Patient presents with  . Follow-up    History of Present Illness:  Calvin [name] is a 60 y.o. male with history of NICM, chronic systolic CHF, CAD-MI 3545, HTN, HLD, DM2, GERD, previously homeless for 6 yrs.   Chest pain 2019 LHC/RHC with mild nonobstructive CAD & EF 35-40%. Readmitted in 10/2017 with chest pain. CTA was negative for PE. He was also admitted in 03/2018 with chest pain. He had a brief episode of A fib and was started on eliquis and carvedilol was increased to 6.25 mg twice a day.Entresto stopped due to dizziness. R/L cath in 3/21 with non-obstructive CAD (LAD 72m 60d, LCX 40% diffuse, RCA 30p, 595m RHC well compensated PCWP 8 CI 4.3   Patient seen 09/14/20 with acute on chronic CHF and started on Lasix 40 mg daily, K, lisinopril changed to losartan 50 mg daily with [facility]. BP was up that day.  In ED 09/26/20 with right sided chest pain and abdominal pain and dizziness. Crt up to 1.68 from 1.38, BNP normal 42.Other labs stable and given GI cocktail and discharged. BP was 146/91. Just seen in [facility] 11/05/20 and BP up. lasix, K and losartan stopped and put on Entresto.  Patient comes in for f/u. So far tolerating Entresto. No dizziness like before but BP has come down. No swelling. Chronic DOE unchanged. Main complaint is constipation. [place]  Past Medical History:  Diagnosis Date  . Adenoidal hypertrophy   . Adenomatous colon polyps 2012  . AKI (acute kidney injury) ([facility]12/14/2020  . Arthritis   . Cataract    Mixed OU  . COVID-19 virus  detected 06/18/2019  . Diabetes mellitus   . Diabetic retinopathy ([facility]   NPDR OU  . GERD (gastroesophageal reflux disease)   . Heart attack ([facility]   [place]  . Marland Kitchenx of adenomatous colonic polyps 08/18/2017  . Hyperlipidemia   . Hypertension   . Hypertensive retinopathy    OU  . Mild CAD    a. cath in 08/2017 showing mild nonobstructive CAD with scattered 20-30% stenosis.   . Nonischemic cardiomyopathy ([facility]   a. EF 20-25% by echo in 09/2017 with cath showing mild CAD. b.  Last echo 12/2017 EF 35-40%, grade 2 DD.  . Marland Kitchenbesity   . PAF (paroxysmal atrial fibrillation) ([facility]  . Sleep apnea    cpap, pt says no longer has    Past Surgical History:  Procedure Laterality Date  . COLONOSCOPY  05/13/11, 08/2017   9 adenomas  . FOREARM SURGERY    . MUSCLE BIOPSY    . POLYPECTOMY    . RIGHT/LEFT HEART CATH AND CORONARY ANGIOGRAPHY N/A 08/25/2017   Procedure: RIGHT/LEFT HEART CATH AND CORONARY ANGIOGRAPHY;  Surgeon: McBurnell BlanksMD;  [facility];  Service: Cardiovascular;  Laterality: N/A;  . RIGHT/LEFT HEART CATH AND CORONARY ANGIOGRAPHY N/A 10/01/2019   Procedure: RIGHT/LEFT HEART CATH AND CORONARY ANGIOGRAPHY;  Surgeon: BeJolaine ArtistMD;  [facility];  Service:  Cardiovascular;  Laterality: N/A;    Current Medications: Current Meds  Medication Sig  . albuterol (VENTOLIN HFA) 108 (90 Base) MCG/ACT inhaler Inhale 2 puffs into the lungs every 6 (six) hours as needed for wheezing or shortness of breath.  [name] amitriptyline (ELAVIL) 10 MG tablet Take 2 tablets at bed time  . atorvastatin (LIPITOR) 80 MG tablet Take 1 tablet (80 mg total) by mouth daily.  . carvedilol (COREG) 6.25 MG tablet Take 1 tablet (6.25 mg total) by mouth 2 (two) times daily with a meal.  . dapagliflozin propanediol (FARXIGA) 10 MG TABS tablet Take 1 tablet (10 mg total) by mouth daily before breakfast.  . donepezil (ARICEPT) 10 MG tablet Take 1 tablet (10 mg  total) by mouth at bedtime.  [name] ELIQUIS 5 MG TABS tablet TAKE 1 TABLET BY MOUTH TWICE DAILY  . ezetimibe (ZETIA) 10 MG tablet Take 1 tablet (10 mg total) by mouth daily.  . insulin degludec (TRESIBA FLEXTOUCH) 200 UNIT/ML FlexTouch Pen Inject 52 Units into the skin daily.  . Insulin Syringe-Needle U-100 (INSULIN SYRINGE .3CC/29GX1/2") 29G X 1/2" 0.3 ML MISC Inject 1 Syringe 3 (three) times daily as directed. Check blood sugar three times daily. Dx: E11.9 (Patient taking differently: Inject 1 Syringe as directed 3 (three) times daily. Check blood sugar three times daily. Dx: E11.9)  . Lancets (ONETOUCH DELICA PLUS XIPJAS50N) MISC USE TWICE DAILY  . lidocaine (LMX) 4 % cream Apply 1 application topically 3 (three) times daily as needed.  . nitroGLYCERIN (NITROSTAT) 0.4 MG SL tablet Place 1 tablet (0.4 mg total) under the tongue every 5 (five) minutes as needed for chest pain.  [name] ULTRA test strip USE TWICE DAILY  . polyethylene glycol (MIRALAX / GLYCOLAX) 17 g packet Take 17 g by mouth 2 (two) times daily.  [name] REPATHA 140 MG/ML SOSY INJECT 1 PEN INTO THE SKIN EVERY 14 DAYS  . sacubitril-valsartan (ENTRESTO) 24-26 MG Take 1 tablet by mouth 2 (two) times daily.  [name] spironolactone (ALDACTONE) 25 MG tablet Take 1 tablet (25 mg total) by mouth daily.  [name] triamcinolone (KENALOG) 0.1 % Apply 1 application topically 3 (three) times daily as needed. For itching     Allergies:   Patient has no known allergies.   Social History   Socioeconomic History  . Marital status: Single    Spouse name: Not on file  . Number of children: 0  . Years of education: Not on file  . Highest education level: Not on file  Occupational History  . Occupation: Disability  Tobacco Use  . Smoking status: Never Smoker  . Smokeless tobacco: Never Used  Vaping Use  . Vaping Use: Never used  Substance and Sexual Activity  . Alcohol use: No  . Drug use: No  . Sexual activity: Yes    Birth control/protection: None   Other Topics Concern  . Not on file  Social History Narrative   Social History   Diet?    Do you drink/eat things with caffeine? yes   Marital status?       single      Do you live in a house, apartment, assisted living, condo, trailer, etc.? yes   Is it one or more stories? One story   How many persons live in your home?   Do you have any pets in your home? (please list)    Highest level of education completed? graduate   Do you exercise?  no                          Type & how often?   Advanced Directives   Do you have a living will? no   Do you have a DNR form?                                  If not, do you want to discuss one? no   Do you have signed POA/HPOA for forms? no      Functional Status   Do you have difficulty bathing or dressing yourself? no   Do you have difficulty preparing food or eating? no   Do you have difficulty managing your medications? no   Do you have difficulty managing your finances? no   Do you have difficulty affording your medications? no   Social Determinants of Health   Financial Resource Strain: Not on file  Food Insecurity: No Food Insecurity  . Worried About Charity fundraiser in the Last Year: Never true  . Ran Out of Food in the Last Year: Never true  Transportation Needs: Not on file  Physical Activity: Not on file  Stress: Not on file  Social Connections: Not on file     Family History:  The patient's family history includes Diabetes in his father and mother; Heart attack (age of onset: 48) in his mother; Heart attack (age of onset: 73) in his sister; Heart attack (age of onset: 75) in his father; Heart disease in his father and mother; Hyperlipidemia in his father and mother; Hypertension in his father and mother.   ROS:   Please see the history of present illness.    ROS All other systems reviewed and are negative.   PHYSICAL EXAM:   VS:  Pulse 85   Ht _0  (1.676 m)   Wt 170 lb (77.1 kg)   SpO2 100%   BMI 27.44  kg/m   Physical Exam  GEN: Well nourished, well developed, in no acute distress  Neck: no JVD, carotid bruits, or masses Cardiac:RRR; positive S4, 1/6 systolic murmur the left sternal border Respiratory:  clear to auscultation bilaterally, normal work of breathing GI: soft, nontender, nondistended, + BS Ext: without cyanosis, clubbing, or edema, Good distal pulses bilaterally Neuro:  Alert and Oriented x 3 Psych: euthymic mood, full affect  Wt Readings from Last 3 Encounters:  11/11/20 170 lb (77.1 kg)  11/05/20 171 lb 12.8 oz (77.9 kg)  10/19/20 178 lb (80.7 kg)      Studies/Labs Reviewed:   EKG:  EKG is not ordered today.    Recent Labs: 09/21/2020: TSH 1.92 09/26/2020: ALT 24; B Natriuretic Peptide 34.4; BUN 17; Creatinine, Ser 1.68; Hemoglobin 15.2; Platelets 229; Potassium 3.8; Sodium 136   Lipid Panel    Component Value Date/Time   CHOL 129 05/25/2020 1202   CHOL 152 04/08/2019 0801   TRIG 85 05/25/2020 1202   HDL 54 05/25/2020 1202   HDL 57 04/08/2019 0801   CHOLHDL 2.4 05/25/2020 1202   VLDL 10 03/09/2018 1500   LDLCALC 58 05/25/2020 1202    Additional studies/ records that were reviewed today include:  [facility] 09/2019   Mid LAD lesion is 40% stenosed.  Prox Cx to Dist Cx lesion is 40% stenosed.  Prox RCA to Mid RCA lesion is 30% stenosed.  Mid RCA lesion is 50% stenosed.  Dist RCA lesion is 30% stenosed.  Dist LAD lesion is 60% stenosed.   Findings:   Ao = 123/67 (91) LV = 123/10 RA =  3 RV = 22/4 PA = 21/8 (14) PCW = 8 Fick cardiac output/index = 7.4/4.3 PVR = 0.8 WU Ao sat = 100% PA sat = 79%, 80% High SVC sat = 85%   Assessment: 1. Moderate non-obstructive CAD 2. NICM EF 35-40% 3. Normal filling pressures 4. High cardiac output without evidence of intracardiac shunting   Plan/Discussion:   Medical therapy. Consider w/u for peripheral shunting process.    Glori Bickers, MD  8:46 AM   Echo 09/2019 IMPRESSIONS     1. Moderate  to severe global reduction in LV systolic function; mild LVH;  grade 1 diastolic dysfunction.   2. Left ventricular ejection fraction, by estimation, is 30 to 35%. The  left ventricle has moderate to severely decreased function. The left  ventricle demonstrates global hypokinesis. There is mild left ventricular  hypertrophy. Left ventricular  diastolic parameters are consistent with Grade I diastolic dysfunction  (impaired relaxation).   3. Right ventricular systolic function is normal. The right ventricular  size is normal.   4. The mitral valve is normal in structure. Trivial mitral valve  regurgitation. No evidence of mitral stenosis.   5. The aortic valve is tricuspid. Aortic valve regurgitation is not  visualized. Mild aortic valve sclerosis is present, with no evidence of  aortic valve stenosis.   6. The inferior vena cava is normal in size with greater than 50%  respiratory variability, suggesting right atrial pressure of 3 mmHg.     Risk Assessment/Calculations:    CHA2DS2-VASc Score = 4  This indicates a 4.8% annual risk of stroke. The patient's score is based upon: CHF History: Yes HTN History: Yes Diabetes History: Yes Stroke History: No Vascular Disease History: Yes Age Score: 0 Gender Score: 0        ASSESSMENT:    1. NICM (nonischemic cardiomyopathy) ([place])   2. Chronic combined systolic and diastolic CHF (congestive heart failure) ([place])   3. PAF (paroxysmal atrial fibrillation) ([place])   4. Coronary artery disease involving native coronary artery of native heart without angina pectoris   5. Essential hypertension   6. Hyperlipidemia, unspecified hyperlipidemia type   7. Type 2 diabetes mellitus with diabetic neuropathy, with long-term current use of insulin (HCC)      PLAN:  In order of problems listed above:  NICM LVEF 30-35% echo 09/2019 followed by advanced heart failure clinic.  Lasix potassium and losartan stopped last week and started on Entresto.   His blood pressure is 99/58 but so far tolerating and no dizziness.  Scheduled for be met tomorrow but will do today while he is here so he does not have to walk to that appointment.  No heart failure on exam today.  Chronic combined systolic and diastolic CHF- started on Entresto last week.  Follow-up labs.  PAF on Eliquis no bleeding problems or palpitations on low-dose Coreg  Mod nonobstructive CAD on cath 09/2019 no angina  HTN pressure was running high but its come down nicely on Entresto.  Has had hypotension on this in the past but so far he is tolerating this low-dose.  HLD on atorvastatin LDL 58 05/2020  DM2 A1c 8.9- 10/15/2020  Shared Decision Making/Informed Consent        Medication Adjustments/Labs and Tests Ordered: Current medicines are reviewed at length with the patient today.  Concerns regarding medicines  are outlined above.  Medication changes, Labs and Tests ordered today are listed in the Patient Instructions below. Patient Instructions  Medication Instructions:  Your physician recommends that you continue on your current medications as directed. Please refer to the Current Medication list given to you today.  *If you need a refill on your cardiac medications before your next appointment, please call your pharmacy*   Lab Work: TODAY: BMET  If you have labs (blood work) drawn today and your tests are completely normal, you will receive your results only by: [name] MyChart Message (if you have MyChart) OR . A paper copy in the mail If you have any lab test that is abnormal or we need to change your treatment, we will call you to review the results.   Follow-Up: At [facility], you and your health needs are our priority.  As part of our continuing mission to provide you with exceptional heart care, we have created designated Provider Care Teams.  These Care Teams include your primary Cardiologist (physician) and Advanced Practice Providers (APPs -  Physician  Assistants and Nurse Practitioners) who all work together to provide you with the care you need, when you need it.  We recommend signing up for the patient portal called "MyChart".  Sign up information is provided on this After Visit Summary.  MyChart is used to connect with patients for Virtual Visits (Telemedicine).  Patients are able to view lab/test results, encounter notes, upcoming appointments, etc.  Non-urgent messages can be sent to your provider as well.   To learn more about what you can do with MyChart, go to [email or web address].    Your next appointment:   6 month(s)  The format for your next appointment:   In Person  Provider:   You may see [name], MD or one of the following Advanced Practice Providers on your designated Care Team:    Melina Copa, [name]  Ermalinda Barrios, [name]      Signed, Ermalinda Barrios, [name]  11/11/2020 12:28 PM    [address], [place], [place]  [address] Phone: [phone]; Fax: [phone]

## 2020-11-04 NOTE — Progress Notes (Addendum)
ADVANCED HF CLINIC NOTE  Referring Physician: Primary Care: Calvin [name]  Primary Cardiologist: Calvin [name]  Neurology: Calvin [name]  [facility]: Calvin [name]   Reason for Visit: Heart Failure  HPI Calvin [name] is a 60 yo male with history of chronic systolic heart failure due to NICM, uncontrolled DMII, GERD, HTN, MI 2015, memory issues, and hyperlipidemia.   Admitted 08/2017 with CP. Underwent LHC/RHC with mild nonobstructive CAD & EF 35-40%.  Readmitted in 10/2017 with chest pain. CTA was negative for PE. He was also admitted in 03/2018 with chest pain. He had a brief episode of A fib and was started on eliquis and carvedilol was increased to 6.25 mg twice a day.  Back in 2020 he was on Entresto but this was later stopped due to dizziness. Placed back on lisinopril.   Had sleep study 07/2018 that was normal.   Repeat 2D Echo 01/2019 that showed EF 35-40%, mild LVH, normal RV size and function. No significant valvular dysfunction.   06/2019 Admitted for Covid 19 infection. Was placed on BiPAP but did not require intubation.  Since Covid, he has continued w/ persistent fatigue and persistent dyspnea. Followed by Paramedicine once a week.   Seen by Calvin. Angelena [name] 08/05/19. Had EKG that showed NSR. No afib. RRR on exam today. He remains on Eliquis for PAF.  I saw him in 3/21 for first time and was very depressed/stressed out and was having a lot of fatigue and SOB. R/L cath in 3/21 with non-obstructive CAD (LAD 70m 60d, LCX 40% diffuse, RCA 30p, 561m RHC well compensated PCWP 8 CI 4.3  Seen in ED 06/08/20 for CP for 4 days and rash. CXR without infiltrates or pulmonary edema, hs-Tn 13-->12, labs unremarkable except elevated creatinine 1.42, EKG stable. CP symptoms resolved and thought to be stress-related. Sent home with prednisone for rash. Has seen PCP since ED visit.  ED visit 09/2020 SOB. BMP low, COVID negative, and troponin negative. Work up negative. D/C to home.   Today he returns for HF follow  up. Overall feeling fine. Occasionally dizzy. Denies SOB/PND/Orthopnea. Able to walk 30 minutes without stopping. Tries to walk to his appointments.  Appetite ok. No fever or chills. Weight at home  pounds. Taking all medications. [place] [facility] [place] for medications. He is on disability + Medicaid.   Studies Echo 09/27/19 EF 40-45% RV ok.  ECHO 12/2017  EF 35-40% Grade II DD. Moderate global reduction in LV systolic function; moderate   diastolic dysfunction. ECHO 09/2017  EF 20-25% Grade IDD  R/L cath in 3/21: with non-obstructive CAD (LAD 4076m0d, LCX 40% diffuse, RCA 30p, 73m85mHC well compensated PCWP 8 CI 4.3  LHC/RHC 08/2017  RA 5  PA 22/11 (15)  PCWP 9  CO 6 CI 3/14   Prox RCA to Mid RCA lesion is 30% stenosed.  Mid RCA to Dist RCA lesion is 30% stenosed.  Prox Cx to Dist Cx lesion is 20% stenosed.  Mid LAD lesion is 30% stenosed.  There is mild to moderate left ventricular systolic dysfunction.  LV end diastolic pressure is mildly elevated.  The left ventricular ejection fraction is 35-45% by visual estimate.  There is no mitral valve regurgitation.    SH: Disabled for 6 years due to diabetes. He does not smoke. Denies cocaine abuse.  Past Medical History:  Diagnosis Date  . Adenoidal hypertrophy   . Adenomatous colon polyps 2012  . AKI (acute  kidney injury) ([place]) 06/17/2019  . Arthritis   . Cataract    Mixed OU  . COVID-19 virus detected 06/18/2019  . Diabetes mellitus   . Diabetic retinopathy ([place])    NPDR OU  . GERD (gastroesophageal reflux disease)   . Heart attack ([place])    While living in [place]  [name] Hx of adenomatous colonic polyps 08/18/2017  . Hyperlipidemia   . Hypertension   . Hypertensive retinopathy    OU  . Mild CAD    a. cath in 08/2017 showing mild nonobstructive CAD with scattered 20-30% stenosis.   . Nonischemic cardiomyopathy ([place])    a. EF 20-25% by echo in 09/2017 with cath showing  mild CAD. b.  Last echo 12/2017 EF 35-40%, grade 2 DD.  [name] Obesity   . PAF (paroxysmal atrial fibrillation) ([place])   . Sleep apnea    cpap, pt says no longer has    Current Outpatient Medications  Medication Sig Dispense Refill  . albuterol (VENTOLIN HFA) 108 (90 Base) MCG/ACT inhaler Inhale 2 puffs into the lungs every 6 (six) hours as needed for wheezing or shortness of breath. 6.7 g 0  . amitriptyline (ELAVIL) 10 MG tablet Take 2 tablets at bed time 180 tablet 3  . atorvastatin (LIPITOR) 80 MG tablet Take 1 tablet (80 mg total) by mouth daily. (Patient taking differently: Take 80 mg by mouth every evening.) 90 tablet 3  . carvedilol (COREG) 6.25 MG tablet Take 1 tablet (6.25 mg total) by mouth 2 (two) times daily with a meal. 180 tablet 3  . dapagliflozin propanediol (FARXIGA) 10 MG TABS tablet Take 1 tablet (10 mg total) by mouth daily before breakfast. 90 tablet 3  . donepezil (ARICEPT) 10 MG tablet Take 1 tablet (10 mg total) by mouth at bedtime. 90 tablet 3  . ELIQUIS 5 MG TABS tablet TAKE 1 TABLET BY MOUTH TWICE DAILY 60 tablet 5  . ezetimibe (ZETIA) 10 MG tablet Take 1 tablet (10 mg total) by mouth daily. 90 tablet 2  . furosemide (LASIX) 40 MG tablet Take 1 tablet (40 mg total) by mouth daily. 30 tablet 11  . insulin degludec (TRESIBA FLEXTOUCH) 200 UNIT/ML FlexTouch Pen Inject 52 Units into the skin daily. 18 mL 3  . Insulin Syringe-Needle U-100 (INSULIN SYRINGE .3CC/29GX1/2") 29G X 1/2" 0.3 ML MISC Inject 1 Syringe 3 (three) times daily as directed. Check blood sugar three times daily. Dx: E11.9 (Patient taking differently: Inject 1 Syringe as directed See admin instructions. Check blood sugar three times daily. Dx: E11.9) 100 each 3  . Lancets (ONETOUCH DELICA PLUS [id number]) MISC USE TWICE DAILY 200 each 3  . lidocaine (LMX) 4 % cream Apply 1 application topically 3 (three) times daily as needed. 30 g 0  . losartan (COZAAR) 50 MG tablet Take 1 tablet (50 mg total) by mouth daily.  (Patient taking differently: Take 50 mg by mouth at bedtime.) 90 tablet 3  . nitroGLYCERIN (NITROSTAT) 0.4 MG SL tablet Place 1 tablet (0.4 mg total) under the tongue every 5 (five) minutes as needed for chest pain. 30 tablet 1  . ONETOUCH ULTRA test strip USE TWICE DAILY 200 strip 3  . polyethylene glycol (MIRALAX / GLYCOLAX) 17 g packet Take 17 g by mouth 2 (two) times daily. 14 each 0  . potassium chloride SA (KLOR-CON) 10 MEQ tablet Take 1 tablet (10 mEq total) by mouth daily. 30 tablet 11  . REPATHA 140 MG/ML SOSY INJECT 1 PEN INTO THE SKIN EVERY  14 DAYS 6 mL 3  . spironolactone (ALDACTONE) 25 MG tablet Take 1 tablet (25 mg total) by mouth daily. 90 tablet 3  . triamcinolone (KENALOG) 0.1 % Apply 1 application topically 3 (three) times daily as needed. For itching 45 g 1   No current facility-administered medications for this encounter.    No Known Allergies    Social History   Socioeconomic History  . Marital status: Single    Spouse name: Not on file  . Number of children: 0  . Years of education: Not on file  . Highest education level: Not on file  Occupational History  . Occupation: Disability  Tobacco Use  . Smoking status: Never Smoker  . Smokeless tobacco: Never Used  Vaping Use  . Vaping Use: Never used  Substance and Sexual Activity  . Alcohol use: No  . Drug use: No  . Sexual activity: Yes    Birth control/protection: None  Other Topics Concern  . Not on file  Social History Narrative   Social History   Diet?    Do you drink/eat things with caffeine? yes   Marital status?       single      Do you live in a house, apartment, assisted living, condo, trailer, etc.? yes   Is it one or more stories? One story   How many persons live in your home?   Do you have any pets in your home? (please list)    Highest level of education completed? graduate   Do you exercise?            no                          Type & how often?   Advanced Directives   Do you have a  living will? no   Do you have a DNR [name]?                                  If not, do you want to discuss one? no   Do you have signed POA/HPOA for forms? no      Functional Status   Do you have difficulty bathing or dressing yourself? no   Do you have difficulty preparing food or eating? no   Do you have difficulty managing your medications? no   Do you have difficulty managing your finances? no   Do you have difficulty affording your medications? no   Social Determinants of Health   Financial Resource Strain: Not on file  Food Insecurity: No Food Insecurity  . Worried About Charity fundraiser in the Last Year: Never true  . Ran Out of Food in the Last Year: Never true  Transportation Needs: Not on file  Physical Activity: Not on file  Stress: Not on file  Social Connections: Not on file  Intimate Partner Violence: Not on file      Family History  Problem Relation Age of Onset  . Heart disease Mother   . Heart attack Mother 36  . Hypertension Mother   . Hyperlipidemia Mother   . Diabetes Mother   . Heart disease Father   . Heart attack Father 14  . Hypertension Father   . Hyperlipidemia Father   . Diabetes Father   . Heart attack Sister 66  . Colon cancer Neg Hx   . Stomach cancer Neg  Hx   . Esophageal cancer Neg Hx   . Rectal cancer Neg Hx   . Colon polyps Neg Hx     Vitals:   11/05/20 1050  BP: (!) 160/98  Pulse: 97  SpO2: 98%  Weight: 77.9 kg (171 lb 12.8 oz)   Wt Readings from Last 3 Encounters:  11/05/20 77.9 kg (171 lb 12.8 oz)  10/19/20 80.7 kg (178 lb)  10/15/20 79 kg (174 lb 3.2 oz)   PHYSICAL EXAM: General:  Well appearing. No resp difficulty HEENT: normal Neck: supple. no JVD. Carotids 2+ bilat; no bruits. No lymphadenopathy or thryomegaly appreciated. Cor: PMI nondisplaced. Regular rate & rhythm. No rubs, gallops or murmurs. Lungs: clear Abdomen: soft, nontender, nondistended. No hepatosplenomegaly. No bruits or masses. Good bowel  sounds. Extremities: no cyanosis, clubbing, rash, edema Neuro: alert & orientedx3, cranial nerves grossly intact. moves all 4 extremities w/o difficulty. Affect pleasant     ASSESSMENT & PLAN: 1. Acute on Chronic Systolic /Diastolic Heart Failure, NICM - ECHO 12/2017 EF 35-40%. LHC 2/219 with nonobstructive CAD.  - ECHO 01/2019, EF 35-40% - Echo 09/27/19 EF 40-45% RV ok  - R/L cath in 3/21 with non-obstructive CAD (LAD 62m 60d, LCX 40% diffuse, RCA 30p, 556m RHC well compensated PCWP 8 CI 4.3 - NYHA I. Reds Clip 30%.  - Stop lasix + potassium with addition of entresto.  - Stop losartan and will re challenge entresto 24-26 mg twice a day. In 2020 entresto stopped due to hypotension I thinks we should try again and stop lasix.  Plan to start on 5/6. Will discuss with HF Paramedicine.  - Continue Farxiga 10 mg daily.  - Continue carvedilol 6.25 mg bid. - Continue spironolactone 25 mg daily.  - Consider Bidil in future if BP allows (will try adding Entresto first) - Repeat ECHO next visit.  - Check BMET in 7 days   2. HTN - Continue current regimen and adding entresto today.   3. Type 2 diabetes mellitus - hgb A1C 8.4 2/22 - Management per Calvin. ElLoanne [name] - Continue Farxiga.  4. CAD:  - No chest pain.  - mild nonobstructive CAD by cath 08/2017 & 3/21 - no ASA w/ Eliquis.  - continue  blocker. - on statin, repatha and zetia -> lipids managed by PCP  5. PAF - Eliquis for a/c.  - Regular on exam.   Follow up in 7 days for BMET with addition of entresto. Discussed medication changes with HF Paramedicine.  Follow up in 3 months with Calvin BeHaroldine Lawsnd an ECHO.    AmDarrick GrinderNP-C  11/05/20 11:00 AM

## 2020-11-05 ENCOUNTER — Ambulatory Visit (HOSPITAL_COMMUNITY)
Admission: RE | Admit: 2020-11-05 | Discharge: 2020-11-05 | Disposition: A | Discharge status: Home / Self Care | Payer: Medicare Other | Source: Ambulatory Visit | Attending: Adult Health | Admitting: Adult Health

## 2020-11-05 ENCOUNTER — Encounter (HOSPITAL_COMMUNITY): Payer: Self-pay

## 2020-11-05 ENCOUNTER — Other Ambulatory Visit: Payer: Self-pay

## 2020-11-05 ENCOUNTER — Other Ambulatory Visit (HOSPITAL_COMMUNITY): Payer: Self-pay

## 2020-11-05 VITALS — BP 160/98 | HR 97 | Wt 171.8 lb

## 2020-11-05 DIAGNOSIS — I428 Other cardiomyopathies: Secondary | ICD-10-CM | POA: Insufficient documentation

## 2020-11-05 DIAGNOSIS — Z794 Long term (current) use of insulin: Secondary | ICD-10-CM | POA: Insufficient documentation

## 2020-11-05 DIAGNOSIS — Z8616 Personal history of COVID-19: Secondary | ICD-10-CM | POA: Diagnosis not present

## 2020-11-05 DIAGNOSIS — Z8249 Family history of ischemic heart disease and other diseases of the circulatory system: Secondary | ICD-10-CM | POA: Insufficient documentation

## 2020-11-05 DIAGNOSIS — E119 Type 2 diabetes mellitus without complications: Secondary | ICD-10-CM | POA: Diagnosis not present

## 2020-11-05 DIAGNOSIS — I2510 Atherosclerotic heart disease of native coronary artery without angina pectoris: Secondary | ICD-10-CM | POA: Diagnosis not present

## 2020-11-05 DIAGNOSIS — Z79899 Other long term (current) drug therapy: Secondary | ICD-10-CM | POA: Diagnosis not present

## 2020-11-05 DIAGNOSIS — E785 Hyperlipidemia, unspecified: Secondary | ICD-10-CM | POA: Insufficient documentation

## 2020-11-05 DIAGNOSIS — R42 Dizziness and giddiness: Secondary | ICD-10-CM | POA: Diagnosis not present

## 2020-11-05 DIAGNOSIS — I10 Essential (primary) hypertension: Secondary | ICD-10-CM

## 2020-11-05 DIAGNOSIS — I252 Old myocardial infarction: Secondary | ICD-10-CM | POA: Insufficient documentation

## 2020-11-05 DIAGNOSIS — I110 Hypertensive heart disease with heart failure: Secondary | ICD-10-CM | POA: Insufficient documentation

## 2020-11-05 DIAGNOSIS — I5042 Chronic combined systolic (congestive) and diastolic (congestive) heart failure: Secondary | ICD-10-CM | POA: Diagnosis not present

## 2020-11-05 DIAGNOSIS — Z7901 Long term (current) use of anticoagulants: Secondary | ICD-10-CM | POA: Diagnosis not present

## 2020-11-05 DIAGNOSIS — K219 Gastro-esophageal reflux disease without esophagitis: Secondary | ICD-10-CM | POA: Insufficient documentation

## 2020-11-05 DIAGNOSIS — I5022 Chronic systolic (congestive) heart failure: Secondary | ICD-10-CM | POA: Diagnosis not present

## 2020-11-05 DIAGNOSIS — I480 Paroxysmal atrial fibrillation: Secondary | ICD-10-CM | POA: Diagnosis not present

## 2020-11-05 MED ORDER — ENTRESTO 24-26 MG PO TABS: 1.0000 | ORAL_TABLET | Freq: Two times a day (BID) | ORAL | 11 refills | Status: DC

## 2020-11-05 NOTE — Progress Notes (Signed)
Paramedicine Encounter   Patient ID: Calvin [name] , male,   DOB: 04/25/1961,59 y.o.,  MRN: [id number]   Met patient in clinic today with provider.  Weight @ clinic-171 B/p-160/98 sp02-98 p-97 REDS CLIP-30%  His losartan, lasix, and potassium is being stopped.  Calvin [name] is replacing those with entresto.  I called pharmacy and once they get that order in they will send it out.  I also updated his address with them.  He has eliquis in his other pill box so I told him to take the eliquis out of the other pill box and place it in the one I just filled.    Calvin [name], [place] 11/05/2020

## 2020-11-05 NOTE — Patient Instructions (Signed)
STOP Losartan STOP Lasix STOP Potassium  START Entresto 24/26 mg, one tab twice a day  Labs needed in 7-10 days   Your physician recommends that you schedule a follow-up appointment in: 3 months with Dr Haroldine Laws and echo  Your physician has requested that you have an echocardiogram. Echocardiography is a painless test that uses sound waves to create images of your heart. It provides your doctor with information about the size and shape of your heart and how well your heart's chambers and valves are working. This procedure takes approximately one hour. There are no restrictions for this procedure.  Do the following things EVERYDAY: 1) Weigh yourself in the morning before breakfast. Write it down and keep it in a log. 2) Take your medicines as prescribed 3) Eat low salt foods--Limit salt (sodium) to 2000 mg per day.  4) Stay as active as you can everyday 5) Limit all fluids for the day to less than 2 liters  At the [facility], you and your health needs are our priority. As part of our continuing mission to provide you with exceptional heart care, we have created designated Provider Care Teams. These Care Teams include your primary Cardiologist (physician) and Advanced Practice Providers (APPs- Physician Assistants and Nurse Practitioners) who all work together to provide you with the care you need, when you need it.   You may see any of the following providers on your designated Care Team at your next follow up: [name] Dr Glori Bickers . Dr Loralie Champagne . Dr Vickki Muff . Darrick Grinder, NP . Lyda Jester, [place] . Audry Riles, PharmD   Please be sure to bring in all your medications bottles to every appointment.   If you have any questions or concerns before your next appointment please send [place] a message through [place] or call our office at [phone].    TO LEAVE A MESSAGE FOR THE NURSE SELECT OPTION 2, PLEASE LEAVE A MESSAGE  INCLUDING: . YOUR NAME . DATE OF BIRTH . CALL BACK NUMBER . REASON FOR CALL**this is important as we prioritize the call backs  YOU WILL RECEIVE A CALL BACK THE SAME DAY AS LONG AS YOU CALL BEFORE 4:00 PM

## 2020-11-11 ENCOUNTER — Ambulatory Visit: Payer: Medicare Other | Admitting: Physician Assistant

## 2020-11-11 ENCOUNTER — Encounter: Payer: Self-pay | Admitting: Physician Assistant

## 2020-11-11 ENCOUNTER — Other Ambulatory Visit: Payer: Self-pay

## 2020-11-11 VITALS — BP 99/58 | HR 85 | Ht 66.0 in | Wt 170.0 lb

## 2020-11-11 DIAGNOSIS — Z794 Long term (current) use of insulin: Secondary | ICD-10-CM

## 2020-11-11 DIAGNOSIS — I10 Essential (primary) hypertension: Secondary | ICD-10-CM | POA: Diagnosis not present

## 2020-11-11 DIAGNOSIS — I428 Other cardiomyopathies: Secondary | ICD-10-CM | POA: Diagnosis not present

## 2020-11-11 DIAGNOSIS — I5042 Chronic combined systolic (congestive) and diastolic (congestive) heart failure: Secondary | ICD-10-CM | POA: Diagnosis not present

## 2020-11-11 DIAGNOSIS — I2510 Atherosclerotic heart disease of native coronary artery without angina pectoris: Secondary | ICD-10-CM

## 2020-11-11 DIAGNOSIS — E785 Hyperlipidemia, unspecified: Secondary | ICD-10-CM

## 2020-11-11 DIAGNOSIS — E1140 Type 2 diabetes mellitus with diabetic neuropathy, unspecified: Secondary | ICD-10-CM | POA: Diagnosis not present

## 2020-11-11 DIAGNOSIS — I480 Paroxysmal atrial fibrillation: Secondary | ICD-10-CM | POA: Diagnosis not present

## 2020-11-11 LAB — BASIC METABOLIC PANEL
BUN/Creatinine Ratio: 12 (ref 10–24)
BUN: 18 mg/dL (ref 8–27)
CO2: 24 mmol/L (ref 20–29)
Calcium: 9.4 mg/dL (ref 8.6–10.2)
Chloride: 102 mmol/L (ref 96–106)
Creatinine, Ser: 1.56 mg/dL — ABNORMAL HIGH (ref 0.76–1.27)
Glucose: 167 mg/dL — ABNORMAL HIGH (ref 65–99)
Potassium: 4.8 mmol/L (ref 3.5–5.2)
Sodium: 140 mmol/L (ref 134–144)
eGFR: 51 mL/min/{1.73_m2} — ABNORMAL LOW (ref >59)

## 2020-11-11 NOTE — Patient Instructions (Signed)
Medication Instructions:  Your physician recommends that you continue on your current medications as directed. Please refer to the Current Medication list given to you today.  *If you need a refill on your cardiac medications before your next appointment, please call your pharmacy*   Lab Work: TODAY: BMET  If you have labs (blood work) drawn today and your tests are completely normal, you will receive your results only by: [name] MyChart Message (if you have MyChart) OR . A paper copy in the mail If you have any lab test that is abnormal or we need to change your treatment, we will call you to review the results.   Follow-Up: At [facility], you and your health needs are our priority.  As part of our continuing mission to provide you with exceptional heart care, we have created designated Provider Care Teams.  These Care Teams include your primary Cardiologist (physician) and Advanced Practice Providers (APPs -  Physician Assistants and Nurse Practitioners) who all work together to provide you with the care you need, when you need it.  We recommend signing up for the patient portal called "MyChart".  Sign up information is provided on this After Visit Summary.  MyChart is used to connect with patients for Virtual Visits (Telemedicine).  Patients are able to view lab/test results, encounter notes, upcoming appointments, etc.  Non-urgent messages can be sent to your provider as well.   To learn more about what you can do with MyChart, go to [email or web address].    Your next appointment:   6 month(s)  The format for your next appointment:   In Person  Provider:   You may see [name], MD or one of the following Advanced Practice Providers on your designated Care Team:    Melina Copa, [name]  Ermalinda Barrios, [name]

## 2020-11-12 ENCOUNTER — Other Ambulatory Visit (HOSPITAL_COMMUNITY): Payer: Medicare Other

## 2020-11-12 ENCOUNTER — Other Ambulatory Visit (HOSPITAL_COMMUNITY): Payer: Self-pay

## 2020-11-12 NOTE — Progress Notes (Signed)
Came out today for med rec.   Was able to fill 2 wks of meds.   He needs atorvastatin, eliquis by then.  Will call in to pharmacy.   Marylouise Stacks, EMT-Paramedic  11/12/20

## 2020-11-19 NOTE — Progress Notes (Shared)
[facility] Note  11/24/2020     CHIEF COMPLAINT Patient presents for No chief complaint on file.   HISTORY OF PRESENT ILLNESS: Calvin [name] is a 60 y.o. male who presents to the clinic today for:     Referring physician: [name], NP [place],   [id number]  HISTORICAL INFORMATION:   Selected notes from the MEDICAL RECORD NUMBER Referred by Dr. [name] originally Re-referred by Dr. Darcey Nora ([facility]) for DM f/u Uncontrolled DM II on insulin A1C: 10.1   CURRENT MEDICATIONS: No current outpatient medications on file. (Ophthalmic Drugs)   No current facility-administered medications for this visit. (Ophthalmic Drugs)   Current Outpatient Medications (Other)  Medication Sig  . albuterol (VENTOLIN HFA) 108 (90 Base) MCG/ACT inhaler Inhale 2 puffs into the lungs every 6 (six) hours as needed for wheezing or shortness of breath.  [name] amitriptyline (ELAVIL) 10 MG tablet Take 2 tablets at bed time (Patient not taking: Reported on 11/12/2020)  . atorvastatin (LIPITOR) 80 MG tablet Take 1 tablet (80 mg total) by mouth daily.  . carvedilol (COREG) 6.25 MG tablet Take 1 tablet (6.25 mg total) by mouth 2 (two) times daily with a meal.  . dapagliflozin propanediol (FARXIGA) 10 MG TABS tablet Take 1 tablet (10 mg total) by mouth daily before breakfast.  . donepezil (ARICEPT) 10 MG tablet Take 1 tablet (10 mg total) by mouth at bedtime. (Patient not taking: Reported on 11/12/2020)  . ELIQUIS 5 MG TABS tablet TAKE 1 TABLET BY MOUTH TWICE DAILY  . ezetimibe (ZETIA) 10 MG tablet Take 1 tablet (10 mg total) by mouth daily.  . insulin degludec (TRESIBA FLEXTOUCH) 200 UNIT/ML FlexTouch Pen Inject 52 Units into the skin daily.  . Insulin Syringe-Needle U-100 (INSULIN SYRINGE .3CC/29GX1/2") 29G X 1/2" 0.3 ML MISC Inject 1 Syringe 3 (three) times daily as directed. Check blood sugar three times daily. Dx: E11.9 (Patient taking  differently: Inject 1 Syringe as directed 3 (three) times daily. Check blood sugar three times daily. Dx: E11.9)  . Lancets (ONETOUCH DELICA PLUS [id number]) MISC USE TWICE DAILY  . lidocaine (LMX) 4 % cream Apply 1 application topically 3 (three) times daily as needed.  . nitroGLYCERIN (NITROSTAT) 0.4 MG SL tablet Place 1 tablet (0.4 mg total) under the tongue every 5 (five) minutes as needed for chest pain. (Patient not taking: Reported on 11/12/2020)  . ONETOUCH ULTRA test strip USE TWICE DAILY  . polyethylene glycol (MIRALAX / GLYCOLAX) 17 g packet Take 17 g by mouth 2 (two) times daily.  [name] REPATHA 140 MG/ML SOSY INJECT 1 PEN INTO THE SKIN EVERY 14 DAYS  . sacubitril-valsartan (ENTRESTO) 24-26 MG Take 1 tablet by mouth 2 (two) times daily.  [name] spironolactone (ALDACTONE) 25 MG tablet Take 1 tablet (25 mg total) by mouth daily.  [name] triamcinolone (KENALOG) 0.1 % Apply 1 application topically 3 (three) times daily as needed. For itching   No current facility-administered medications for this visit. (Other)      REVIEW OF SYSTEMS:    ALLERGIES No Known Allergies  PAST MEDICAL HISTORY Past Medical History:  Diagnosis Date  . Adenoidal hypertrophy   . Adenomatous colon polyps 2012  . AKI (acute kidney injury) ([place]) 06/17/2019  . Arthritis   . Cataract    Mixed OU  . COVID-19 virus detected 06/18/2019  . Diabetes mellitus   . Diabetic retinopathy ([place])    NPDR OU  . GERD (gastroesophageal  reflux disease)   . Heart attack ([place])    [place] living in [place]  [name] Hx of adenomatous colonic polyps 08/18/2017  . Hyperlipidemia   . Hypertension   . Hypertensive retinopathy    OU  . Mild CAD    a. cath in 08/2017 showing mild nonobstructive CAD with scattered 20-30% stenosis.   . Nonischemic cardiomyopathy ([place])    a. EF 20-25% by echo in 09/2017 with cath showing mild CAD. b.  Last echo 12/2017 EF 35-40%, grade 2 DD.  [name] Obesity   . PAF (paroxysmal atrial fibrillation) ([place])   . Sleep apnea     cpap, pt says no longer has   Past Surgical History:  Procedure Laterality Date  . COLONOSCOPY  05/13/11, 08/2017   9 adenomas  . FOREARM SURGERY    . MUSCLE BIOPSY    . POLYPECTOMY    . RIGHT/LEFT HEART CATH AND CORONARY ANGIOGRAPHY N/A 08/25/2017   Procedure: RIGHT/LEFT HEART CATH AND CORONARY ANGIOGRAPHY;  Surgeon: [name], MD;  Location: [facility];  Service: Cardiovascular;  Laterality: N/A;  . RIGHT/LEFT HEART CATH AND CORONARY ANGIOGRAPHY N/A 10/01/2019   Procedure: RIGHT/LEFT HEART CATH AND CORONARY ANGIOGRAPHY;  Surgeon: [name], MD;  Location: [facility];  Service: Cardiovascular;  Laterality: N/A;    FAMILY HISTORY Family History  Problem Relation Age of Onset  . Heart disease Mother   . Heart attack Mother 82  . Hypertension Mother   . Hyperlipidemia Mother   . Diabetes Mother   . Heart disease Father   . Heart attack Father 54  . Hypertension Father   . Hyperlipidemia Father   . Diabetes Father   . Heart attack Sister 66  . Colon cancer Neg Hx   . Stomach cancer Neg Hx   . Esophageal cancer Neg Hx   . Rectal cancer Neg Hx   . Colon polyps Neg Hx     SOCIAL HISTORY Social History   Tobacco Use  . Smoking status: Never Smoker  . Smokeless tobacco: Never Used  Vaping Use  . Vaping Use: Never used  Substance Use Topics  . Alcohol use: No  . Drug use: No         OPHTHALMIC EXAM:  Not recorded     IMAGING AND PROCEDURES  Imaging and Procedures for 11/24/2020           ASSESSMENT/PLAN:  No diagnosis found.  1,2. Severe Non-proliferative diabetic retinopathy w/ DME, OU  - pt was delayed from 4 weeks to 9 weeks (12.14.21 to 2.21.22) due to financial / insurance reasons  - A1c 8.4% on 02.08.22 -- steady improvement since July 2021 (10.1%)  - s/p IVA OS #1 (09.16.21), IVA OD #1 (09.21.21)             - s/p IVA OU #2 (10.19.21), #3 (11.16.21), #4 (12.14.21), #5 (02.21.22), #6 (03.21.22), # 7  (4.18.22) - exam shows central edema OU, scattered IRH and severe exudates OU -- improving - FA (09.16.21) shows late leaking MA greatest posterior pole; no NV; +peripheral vascular perfusion defects -- may benefit from peripheral PRP OU  - OCT shows OD: Mild Interval improvement in IRF; OS: Interval improvement in IRF  - recommend IVA OU #8 today, 5.24.22  - Avastin informed consent obtained, signed and scanned OD, 09.21.21 - Avastin informed consent obtained, signed and scanned, 09.16.21 (OS) - f/u 5 weeks, DFE, OCT, possible injection(s)  3,4. Hypertensive retinopathy OU - discussed importance  of tight BP control - monitor  5. Mixed Cataract OU - The symptoms of cataract, surgical options, and treatments and risks were discussed with patient. - discussed diagnosis and progression - visually significant -- will refer for cat eval once DR more stable   Ophthalmic Meds Ordered this visit:  No orders of the defined types were placed in this encounter.      No follow-ups on file.  There are no Patient Instructions on file for this visit.  This document serves as a record of services personally performed by Gardiner Sleeper, MD, PhD. It was created on their behalf by Estill Bakes, COT an ophthalmic technician. The creation of this record is the provider's dictation and/or activities during the visit.    Electronically signed by: Estill Bakes, COT 5.19.22 @ 2:01 PM  Gardiner Sleeper, M.D., Ph.D. Diseases & Surgery of the Retina and Vitreous [facility]: M myopia (nearsighted); A astigmatism; H hyperopia (farsighted); P presbyopia; Mrx spectacle prescription;  CTL contact lenses; OD right eye; OS left eye; OU both eyes  XT exotropia; ET esotropia; PEK punctate epithelial keratitis; PEE punctate epithelial erosions; DES dry eye syndrome; MGD meibomian gland dysfunction; ATs artificial tears; PFAT's preservative free artificial tears; [place] nuclear  sclerotic cataract; PSC posterior subcapsular cataract; ERM epi-retinal membrane; PVD posterior vitreous detachment; RD retinal detachment; DM diabetes mellitus; DR diabetic retinopathy; NPDR non-proliferative diabetic retinopathy; PDR proliferative diabetic retinopathy; CSME clinically significant macular edema; DME diabetic macular edema; dbh dot blot hemorrhages; CWS cotton wool spot; POAG primary open angle glaucoma; C/D cup-to-disc ratio; HVF humphrey visual field; GVF goldmann visual field; OCT optical coherence tomography; IOP intraocular pressure; BRVO Branch retinal vein occlusion; CRVO central retinal vein occlusion; CRAO central retinal artery occlusion; BRAO branch retinal artery occlusion; RT retinal tear; SB scleral buckle; PPV pars plana vitrectomy; VH Vitreous hemorrhage; PRP panretinal laser photocoagulation; IVK intravitreal kenalog; VMT vitreomacular traction; MH Macular hole;  NVD neovascularization of the disc; NVE neovascularization elsewhere; AREDS age related eye disease study; ARMD age related macular degeneration; POAG primary open angle glaucoma; EBMD epithelial/anterior basement membrane dystrophy; ACIOL anterior chamber intraocular lens; IOL intraocular lens; PCIOL posterior chamber intraocular lens; Phaco/IOL phacoemulsification with intraocular lens placement; [place] photorefractive keratectomy; LASIK laser assisted in situ keratomileusis; HTN hypertension; DM diabetes mellitus; COPD chronic obstructive pulmonary disease

## 2020-11-24 ENCOUNTER — Emergency Department (HOSPITAL_COMMUNITY): Payer: Medicare Other

## 2020-11-24 ENCOUNTER — Other Ambulatory Visit: Payer: Self-pay

## 2020-11-24 ENCOUNTER — Other Ambulatory Visit (HOSPITAL_COMMUNITY): Payer: Self-pay

## 2020-11-24 ENCOUNTER — Emergency Department (HOSPITAL_COMMUNITY)
Admission: EM | Admit: 2020-11-24 | Discharge: 2020-11-24 | Disposition: A | Discharge status: Home / Self Care | Payer: Medicare Other | Attending: Emergency Medicine | Admitting: Emergency Medicine

## 2020-11-24 ENCOUNTER — Encounter (INDEPENDENT_AMBULATORY_CARE_PROVIDER_SITE_OTHER): Payer: Medicare HMO | Admitting: Ophthalmology

## 2020-11-24 DIAGNOSIS — R030 Elevated blood-pressure reading, without diagnosis of hypertension: Secondary | ICD-10-CM

## 2020-11-24 DIAGNOSIS — Z794 Long term (current) use of insulin: Secondary | ICD-10-CM | POA: Insufficient documentation

## 2020-11-24 DIAGNOSIS — E1140 Type 2 diabetes mellitus with diabetic neuropathy, unspecified: Secondary | ICD-10-CM | POA: Insufficient documentation

## 2020-11-24 DIAGNOSIS — R059 Cough, unspecified: Secondary | ICD-10-CM | POA: Diagnosis not present

## 2020-11-24 DIAGNOSIS — E11319 Type 2 diabetes mellitus with unspecified diabetic retinopathy without macular edema: Secondary | ICD-10-CM | POA: Diagnosis not present

## 2020-11-24 DIAGNOSIS — I110 Hypertensive heart disease with heart failure: Secondary | ICD-10-CM | POA: Insufficient documentation

## 2020-11-24 DIAGNOSIS — H25813 Combined forms of age-related cataract, bilateral: Secondary | ICD-10-CM

## 2020-11-24 DIAGNOSIS — R072 Precordial pain: Secondary | ICD-10-CM

## 2020-11-24 DIAGNOSIS — Z7901 Long term (current) use of anticoagulants: Secondary | ICD-10-CM | POA: Diagnosis not present

## 2020-11-24 DIAGNOSIS — R079 Chest pain, unspecified: Secondary | ICD-10-CM | POA: Diagnosis not present

## 2020-11-24 DIAGNOSIS — I10 Essential (primary) hypertension: Secondary | ICD-10-CM | POA: Diagnosis not present

## 2020-11-24 DIAGNOSIS — R0789 Other chest pain: Secondary | ICD-10-CM | POA: Diagnosis not present

## 2020-11-24 DIAGNOSIS — Z79899 Other long term (current) drug therapy: Secondary | ICD-10-CM | POA: Diagnosis not present

## 2020-11-24 DIAGNOSIS — Z8616 Personal history of COVID-19: Secondary | ICD-10-CM | POA: Insufficient documentation

## 2020-11-24 DIAGNOSIS — H35033 Hypertensive retinopathy, bilateral: Secondary | ICD-10-CM

## 2020-11-24 DIAGNOSIS — Z955 Presence of coronary angioplasty implant and graft: Secondary | ICD-10-CM | POA: Insufficient documentation

## 2020-11-24 DIAGNOSIS — E11649 Type 2 diabetes mellitus with hypoglycemia without coma: Secondary | ICD-10-CM | POA: Diagnosis not present

## 2020-11-24 DIAGNOSIS — H3581 Retinal edema: Secondary | ICD-10-CM

## 2020-11-24 DIAGNOSIS — I2510 Atherosclerotic heart disease of native coronary artery without angina pectoris: Secondary | ICD-10-CM | POA: Diagnosis not present

## 2020-11-24 DIAGNOSIS — I5022 Chronic systolic (congestive) heart failure: Secondary | ICD-10-CM | POA: Insufficient documentation

## 2020-11-24 DIAGNOSIS — I517 Cardiomegaly: Secondary | ICD-10-CM | POA: Diagnosis not present

## 2020-11-24 DIAGNOSIS — E113413 Type 2 diabetes mellitus with severe nonproliferative diabetic retinopathy with macular edema, bilateral: Secondary | ICD-10-CM

## 2020-11-24 LAB — BASIC METABOLIC PANEL
Anion gap: 5 (ref 5–15)
BUN: 7 mg/dL (ref 6–20)
CO2: 28 mmol/L (ref 22–32)
Calcium: 9.1 mg/dL (ref 8.9–10.3)
Chloride: 105 mmol/L (ref 98–111)
Creatinine, Ser: 1.18 mg/dL (ref 0.61–1.24)
GFR, Estimated: >60 mL/min (ref >60)
Glucose, Bld: 70 mg/dL (ref 70–99)
Potassium: 4.1 mmol/L (ref 3.5–5.1)
Sodium: 138 mmol/L (ref 135–145)

## 2020-11-24 LAB — CBC
HCT: 45.1 % (ref 39.0–52.0)
Hemoglobin: 14.1 g/dL (ref 13.0–17.0)
MCH: 28.7 pg (ref 26.0–34.0)
MCHC: 31.3 g/dL (ref 30.0–36.0)
MCV: 91.7 fL (ref 80.0–100.0)
Platelets: 247 10*3/uL (ref 150–400)
RBC: 4.92 MIL/uL (ref 4.22–5.81)
RDW: 14 % (ref 11.5–15.5)
WBC: 5.7 10*3/uL (ref 4.0–10.5)
nRBC: 0 % (ref 0.0–0.2)

## 2020-11-24 LAB — TROPONIN I (HIGH SENSITIVITY): Troponin I (High Sensitivity): 15 ng/L (ref <18)

## 2020-11-24 MED ORDER — FAMOTIDINE 20 MG PO TABS
20.0000 mg | ORAL_TABLET | Freq: Once | ORAL | Status: AC
  Administered 2020-11-24: 20 mg via ORAL
  Filled 2020-11-24: qty 1

## 2020-11-24 MED ORDER — ACETAMINOPHEN 500 MG PO TABS
1000.0000 mg | ORAL_TABLET | Freq: Once | ORAL | Status: AC
  Administered 2020-11-24: 1000 mg via ORAL
  Filled 2020-11-24: qty 2

## 2020-11-24 MED ORDER — ALUM & MAG HYDROXIDE-SIMETH 200-200-20 MG/5ML PO SUSP
30.0000 mL | Freq: Once | ORAL | Status: AC
  Administered 2020-11-24: 30 mL via ORAL
  Filled 2020-11-24: qty 30

## 2020-11-24 NOTE — ED Notes (Signed)
Patient is resting comfortably. 

## 2020-11-24 NOTE — Progress Notes (Signed)
Paramedicine Encounter    Patient ID: Calvin [name], male    DOB: 18-Aug-1960, 60 y.o.   MRN: [id number]   [name], NP as PCP - General (Geriatric Medicine) [name], MD as PCP - Cardiology (Cardiology) [name], [name], MD as PCP - Advanced Heart Failure (Cardiology) [name], MD as Consulting Physician (Endocrinology) Jon Billings, RN as [place] Management Uris, Connye Burkitt, LCSW as Social Worker (Licensed Clinical Social Worker)  Patient Active Problem List   Diagnosis Date Noted  . Adrenal nodule ([place]) 09/21/2020  . Intermittent claudication ([place]) 05/25/2020  . Hypoglycemia due to insulin 10/15/2019  . Chronic anticoagulation 10/15/2019  . Right sided weakness 06/17/2019  . Foot pain, bilateral 02/01/2019  . Overgrown toenails 12/20/2018  . Unsteady gait 12/20/2018  . Encounter for therapeutic drug monitoring 10/22/2018  . AF (paroxysmal atrial fibrillation) ([place]) 03/10/2018  . Diabetic neuropathy ([place]) 09/26/2017  . MCI (mild cognitive impairment) 06/23/2017  . OSA on CPAP 05/10/2017  . Gastroesophageal reflux disease 05/10/2017  . Insomnia 05/10/2017  . Onychomycosis of multiple toenails with type 2 diabetes mellitus and peripheral neuropathy ([place]) 05/10/2017  . Chronic systolic heart failure ([place]) 11/22/2015  . Essential hypertension 11/22/2015  . DM (diabetes mellitus) ([place]) 11/20/2015  . HLD (hyperlipidemia) 11/20/2015  . Hx of adenomatous colonic polyps 05/19/2011    Current Outpatient Medications:  .  albuterol (VENTOLIN HFA) 108 (90 Base) MCG/ACT inhaler, Inhale 2 puffs into the lungs every 6 (six) hours as needed for wheezing or shortness of breath., Disp: 6.7 g, Rfl: 0 .  atorvastatin (LIPITOR) 80 MG tablet, Take 1 tablet (80 mg total) by mouth daily. (Patient taking differently: Take 80 mg by mouth every evening.), Disp: 90 tablet, Rfl: 3 .  carvedilol (COREG) 6.25 MG tablet, Take 1  tablet (6.25 mg total) by mouth 2 (two) times daily with a meal., Disp: 180 tablet, Rfl: 3 .  dapagliflozin propanediol (FARXIGA) 10 MG TABS tablet, Take 1 tablet (10 mg total) by mouth daily before breakfast., Disp: 90 tablet, Rfl: 3 .  ELIQUIS 5 MG TABS tablet, TAKE 1 TABLET BY MOUTH TWICE DAILY, Disp: 60 tablet, Rfl: 5 .  ezetimibe (ZETIA) 10 MG tablet, Take 1 tablet (10 mg total) by mouth daily., Disp: 90 tablet, Rfl: 2 .  insulin degludec (TRESIBA FLEXTOUCH) 200 UNIT/ML FlexTouch Pen, Inject 52 Units into the skin daily., Disp: 18 mL, Rfl: 3 .  Insulin Syringe-Needle U-100 (INSULIN SYRINGE .3CC/29GX1/2") 29G X 1/2" 0.3 ML MISC, Inject 1 Syringe 3 (three) times daily as directed. Check blood sugar three times daily. Dx: E11.9 (Patient taking differently: Inject 1 Syringe as directed 3 (three) times daily. Check blood sugar three times daily. Dx: E11.9), Disp: 100 each, Rfl: 3 .  Lancets (ONETOUCH DELICA PLUS [id number]) MISC, USE TWICE DAILY, Disp: 200 each, Rfl: 3 .  lidocaine (LMX) 4 % cream, Apply 1 application topically 3 (three) times daily as needed., Disp: 30 g, Rfl: 0 .  ONETOUCH ULTRA test strip, USE TWICE DAILY, Disp: 200 strip, Rfl: 3 .  polyethylene glycol (MIRALAX / GLYCOLAX) 17 g packet, Take 17 g by mouth 2 (two) times daily., Disp: 14 each, Rfl: 0 .  REPATHA 140 MG/ML SOSY, INJECT 1 PEN INTO THE SKIN EVERY 14 DAYS, Disp: 6 mL, Rfl: 3 .  sacubitril-valsartan (ENTRESTO) 24-26 MG, Take 1 tablet by mouth 2 (two) times daily., Disp: 60 tablet, Rfl: 11 .  spironolactone (ALDACTONE) 25 MG tablet, Take 1  tablet (25 mg total) by mouth daily., Disp: 90 tablet, Rfl: 3 .  triamcinolone (KENALOG) 0.1 %, Apply 1 application topically 3 (three) times daily as needed. For itching, Disp: 45 g, Rfl: 1 .  amitriptyline (ELAVIL) 10 MG tablet, Take 2 tablets at bed time (Patient not taking: No sig reported), Disp: 180 tablet, Rfl: 3 .  donepezil (ARICEPT) 10 MG tablet, Take 1 tablet (10 mg total) by  mouth at bedtime. (Patient not taking: No sig reported), Disp: 90 tablet, Rfl: 3 .  nitroGLYCERIN (NITROSTAT) 0.4 MG SL tablet, Place 1 tablet (0.4 mg total) under the tongue every 5 (five) minutes as needed for chest pain. (Patient not taking: No sig reported), Disp: 30 tablet, Rfl: 1 No Known Allergies    Social History   Socioeconomic History  . Marital status: Single    Spouse name: Not on file  . Number of children: 0  . Years of education: Not on file  . Highest education level: Not on file  Occupational History  . Occupation: Disability  Tobacco Use  . Smoking status: Never Smoker  . Smokeless tobacco: Never Used  Vaping Use  . Vaping Use: Never used  Substance and Sexual Activity  . Alcohol use: No  . Drug use: No  . Sexual activity: Yes    Birth control/protection: None  Other Topics Concern  . Not on file  Social History Narrative   Social History   Diet?    Do you drink/eat things with caffeine? yes   Marital status?       single      Do you live in a house, apartment, assisted living, condo, trailer, etc.? yes   Is it one or more stories? One story   How many persons live in your home?   Do you have any pets in your home? (please list)    Highest level of education completed? graduate   Do you exercise?            no                          Type & how often?   Advanced Directives   Do you have a living will? no   Do you have a DNR form?                                  If not, do you want to discuss one? no   Do you have signed POA/HPOA for forms? no      Functional Status   Do you have difficulty bathing or dressing yourself? no   Do you have difficulty preparing food or eating? no   Do you have difficulty managing your medications? no   Do you have difficulty managing your finances? no   Do you have difficulty affording your medications? no   Social Determinants of Health   Financial Resource Strain: Not on file  Food Insecurity: No Food Insecurity   . Worried About Charity fundraiser in the Last Year: Never true  . Ran Out of Food in the Last Year: Never true  Transportation Needs: Not on file  Physical Activity: Not on file  Stress: Not on file  Social Connections: Not on file  Intimate Partner Violence: Not on file    Physical Exam      Future Appointments  Date Time Provider Department  Center  12/04/2020  1:30 PM [name], MD [facility]  12/04/2020  3:00 PM [name], MD [facility]TRE None  12/18/2020  9:00 AM Jon Billings, RN [facility]COM None  01/14/2021  1:00 PM [name], MD [facility] None  01/15/2021 11:00 AM [name], NP [facility]PSC None  02/08/2021  1:00 PM [place] ECHO OP 1 [facility]  02/08/2021  2:00 PM [name], [name], MD [facility]-HVSC None  07/08/2021  8:30 AM Cameron Sprang, MD LBN-LBNG None    BP 130/72   Pulse 80   Resp 18   Wt 170 lb (77.1 kg)   SpO2 99%   BMI 27.44 kg/m   Weight yesterday-? Last visit weight-171 @ clinic  Pt reports he is doing well. He is loving his new apt and loving being by himself and having his own place.  He denies increased sob, dizziness here and there.  Pt reports he is sleeping good. The appetite is ok.  Pt denies c/p. No h/a. Still gets light headed sometimes.  Filled up a month worth of meds for him- He is missing 4 entresto tablets in pillbox 1 and then all the rows of the 2nd pill box.  After this month we should be able to start the pill dosing packs. He needs 32 tablets of the entresto. They will bring it out on Monday. CBG's between around 245-250-does not get over 300.   Marylouise Stacks, [place] [facility] Paramedic  11/24/20

## 2020-11-24 NOTE — ED Triage Notes (Signed)
Pt reports left sided CP and pain into left arm. Pt reports SOB with the pain.

## 2020-11-24 NOTE — Discharge Instructions (Addendum)
It was our pleasure to provide your ER care today - we hope that you feel better.  Follow up with your doctor/cardiologist in the next 1-2 weeks - call office to arrange appointment. Also have your blood pressure rechecked then, as it is high today.  Return to ER if worse, new symptoms, increased trouble breathing, recurrent or persistent chest pain, high fevers, or other emergency concern.

## 2020-11-24 NOTE — ED Notes (Signed)
All appropriate discharge materials reviewed at length with patient. Time for questions provided. Pt has no other questions at this time and verbalizes understanding of all provided materials.  

## 2020-11-24 NOTE — ED Provider Notes (Signed)
[facility] EMERGENCY DEPARTMENT Provider Note   CSN: [id number] Arrival date & time: 11/24/20  1207     History Chief Complaint  Patient presents with  . Chest Pain    Calvin [name] is a 60 y.o. male.  Patient c/o mid to left chest pain for past two days. Symptoms at rest, mild, dull, constant, persistent, non radiating, at rest, not pleuritic. No associated sob, nv or diaphoresis. Occasional non prod cough. No sore throat or runny nose. No fever or chills. No known specific ill contacts. Denies exertional cp or discomfort. No unusual doe. No leg pain or swelling. No neck or back pain. No abd pain.   The history is provided by the patient.  Chest Pain Associated symptoms: cough   Associated symptoms: no abdominal pain, no back pain, no fever, no headache, no palpitations, no shortness of breath and no vomiting        Past Medical History:  Diagnosis Date  . Adenoidal hypertrophy   . Adenomatous colon polyps 2012  . AKI (acute kidney injury) ([place]) 06/17/2019  . Arthritis   . Cataract    Mixed OU  . COVID-19 virus detected 06/18/2019  . Diabetes mellitus   . Diabetic retinopathy ([place])    NPDR OU  . GERD (gastroesophageal reflux disease)   . Heart attack ([place])    [place]  [name] Hx of adenomatous colonic polyps 08/18/2017  . Hyperlipidemia   . Hypertension   . Hypertensive retinopathy    OU  . Mild CAD    a. cath in 08/2017 showing mild nonobstructive CAD with scattered 20-30% stenosis.   . Nonischemic cardiomyopathy ([place])    a. EF 20-25% by echo in 09/2017 with cath showing mild CAD. b.  Last echo 12/2017 EF 35-40%, grade 2 DD.  [name] Obesity   . PAF (paroxysmal atrial fibrillation) ([place])   . Sleep apnea    cpap, pt says no longer has    Patient Active Problem List   Diagnosis Date Noted  . Adrenal nodule ([place]) 09/21/2020  . Intermittent claudication ([place]) 05/25/2020  . Hypoglycemia due to insulin 10/15/2019  . Chronic  anticoagulation 10/15/2019  . Right sided weakness 06/17/2019  . Foot pain, bilateral 02/01/2019  . Overgrown toenails 12/20/2018  . Unsteady gait 12/20/2018  . Encounter for therapeutic drug monitoring 10/22/2018  . AF (paroxysmal atrial fibrillation) ([place]) 03/10/2018  . Diabetic neuropathy ([employer]) 09/26/2017  . MCI (mild cognitive impairment) 06/23/2017  . OSA on CPAP 05/10/2017  . Gastroesophageal reflux disease 05/10/2017  . Insomnia 05/10/2017  . Onychomycosis of multiple toenails with type 2 diabetes mellitus and peripheral neuropathy ([place]) 05/10/2017  . Chronic systolic heart failure ([place]) 11/22/2015  . Essential hypertension 11/22/2015  . DM (diabetes mellitus) ([place]) 11/20/2015  . HLD (hyperlipidemia) 11/20/2015  . Hx of adenomatous colonic polyps 05/19/2011    Past Surgical History:  Procedure Laterality Date  . COLONOSCOPY  05/13/11, 08/2017   9 adenomas  . FOREARM SURGERY    . MUSCLE BIOPSY    . POLYPECTOMY    . RIGHT/LEFT HEART CATH AND CORONARY ANGIOGRAPHY N/A 08/25/2017   Procedure: RIGHT/LEFT HEART CATH AND CORONARY ANGIOGRAPHY;  Surgeon: [name], MD;  Location: [place] [facility];  Service: Cardiovascular;  Laterality: N/A;  . RIGHT/LEFT HEART CATH AND CORONARY ANGIOGRAPHY N/A 10/01/2019   Procedure: RIGHT/LEFT HEART CATH AND CORONARY ANGIOGRAPHY;  Surgeon: [name], MD;  Location: [place] [facility];  Service: Cardiovascular;  Laterality: N/A;  Family History  Problem Relation Age of Onset  . Heart disease Mother   . Heart attack Mother 45  . Hypertension Mother   . Hyperlipidemia Mother   . Diabetes Mother   . Heart disease Father   . Heart attack Father 87  . Hypertension Father   . Hyperlipidemia Father   . Diabetes Father   . Heart attack Sister 40  . Colon cancer Neg Hx   . Stomach cancer Neg Hx   . Esophageal cancer Neg Hx   . Rectal cancer Neg Hx   . Colon polyps Neg Hx     Social History   Tobacco Use  .  Smoking status: Never Smoker  . Smokeless tobacco: Never Used  Vaping Use  . Vaping Use: Never used  Substance Use Topics  . Alcohol use: No  . Drug use: No    Home Medications Prior to Admission medications   Medication Sig Start Date End Date Taking? Authorizing Provider  albuterol (VENTOLIN HFA) 108 (90 Base) MCG/ACT inhaler Inhale 2 puffs into the lungs every 6 (six) hours as needed for wheezing or shortness of breath. 06/30/19   Thurnell Lose, MD  amitriptyline (ELAVIL) 10 MG tablet Take 2 tablets at bed time Patient not taking: No sig reported 08/27/19   Cameron Sprang, MD  atorvastatin (LIPITOR) 80 MG tablet Take 1 tablet (80 mg total) by mouth daily. Patient taking differently: Take 80 mg by mouth every evening. 08/07/20 08/02/21  [name], [name], MD  carvedilol (COREG) 6.25 MG tablet Take 1 tablet (6.25 mg total) by mouth 2 (two) times daily with a meal. 10/15/20   Lyda Jester M, [name]  dapagliflozin propanediol (FARXIGA) 10 MG TABS tablet Take 1 tablet (10 mg total) by mouth daily before breakfast. 06/24/20   [name], [name], MD  donepezil (ARICEPT) 10 MG tablet Take 1 tablet (10 mg total) by mouth at bedtime. Patient not taking: No sig reported 08/27/19   Cameron Sprang, MD  ELIQUIS 5 MG TABS tablet TAKE 1 TABLET BY MOUTH TWICE DAILY 05/27/20   Larey Dresser, MD  ezetimibe (ZETIA) 10 MG tablet Take 1 tablet (10 mg total) by mouth daily. 08/05/20   [name], MD  insulin degludec (TRESIBA FLEXTOUCH) 200 UNIT/ML FlexTouch Pen Inject 52 Units into the skin daily. 10/15/20   [name], MD  Insulin Syringe-Needle U-100 (INSULIN SYRINGE .3CC/29GX1/2") 29G X 1/2" 0.3 ML MISC Inject 1 Syringe 3 (three) times daily as directed. Check blood sugar three times daily. Dx: E11.9 Patient taking differently: Inject 1 Syringe as directed 3 (three) times daily. Check blood sugar three times daily. Dx: E11.9 05/10/17   [name], DO  Lancets (ONETOUCH DELICA PLUS  [id number]) [place] 10/30/19   [name], MD  lidocaine (LMX) 4 % cream Apply 1 application topically 3 (three) times daily as needed. 12/24/19   Nils Flack, Mina A, [name]  nitroGLYCERIN (NITROSTAT) 0.4 MG SL tablet Place 1 tablet (0.4 mg total) under the tongue every 5 (five) minutes as needed for chest pain. Patient not taking: No sig reported 10/27/17   [name], DO  [facility] ULTRA test strip USE TWICE DAILY 10/30/19   [name], MD  polyethylene glycol (MIRALAX / GLYCOLAX) 17 g packet Take 17 g by mouth 2 (two) times daily. 10/06/20   [name], MD  REPATHA 140 MG/ML SOSY INJECT 1 PEN INTO THE SKIN EVERY 14 DAYS 09/08/20   [name], [name], MD  sacubitril-valsartan (ENTRESTO) 24-26 MG Take 1 tablet by mouth 2 (two) times daily. 11/05/20   Clegg, Amy D, NP  spironolactone (ALDACTONE) 25 MG tablet Take 1 tablet (25 mg total) by mouth daily. 10/15/20   Lyda Jester M, [name]  triamcinolone (KENALOG) 0.1 % Apply 1 application topically 3 (three) times daily as needed. For itching 06/09/20   [name], MD    Allergies    Patient has no known allergies.  Review of Systems   Review of Systems  Constitutional: Negative for fever.  HENT: Negative for sore throat.   Eyes: Negative for redness.  Respiratory: Positive for cough. Negative for shortness of breath.   Cardiovascular: Positive for chest pain. Negative for palpitations and leg swelling.  Gastrointestinal: Negative for abdominal pain, diarrhea and vomiting.  Genitourinary: Negative for flank pain.  Musculoskeletal: Negative for back pain and neck pain.  Skin: Negative for rash.  Neurological: Negative for headaches.  Hematological: Does not bruise/bleed easily.  Psychiatric/Behavioral: Negative for confusion.    Physical Exam Updated Vital Signs BP 120/71 (BP Location: Right Arm)   Pulse 79   Temp 98.5 F (36.9 C)   Resp 16   Ht 1.676 m (5' 6" )   Wt 78.5 kg   SpO2 100%   BMI 27.92 kg/m   Physical  Exam Vitals and nursing note reviewed.  Constitutional:      Appearance: Normal appearance. He is well-developed.  HENT:     Head: Atraumatic.     Nose: Nose normal.     Mouth/Throat:     Mouth: Mucous membranes are moist.     Pharynx: Oropharynx is clear.  Eyes:     General: No scleral icterus.    Conjunctiva/sclera: Conjunctivae normal.  Neck:     Trachea: No tracheal deviation.  Cardiovascular:     Rate and Rhythm: Normal rate and regular rhythm.     Pulses: Normal pulses.     Heart sounds: Normal heart sounds. No murmur heard. No friction rub. No gallop.   Pulmonary:     Effort: Pulmonary effort is normal. No accessory muscle usage or respiratory distress.     Breath sounds: Normal breath sounds.     Comments: No sts. No skin changes/redness/lesions.  Chest:     Chest wall: Tenderness present.  Abdominal:     General: Bowel sounds are normal. There is no distension.     Palpations: Abdomen is soft.     Tenderness: There is no abdominal tenderness. There is no guarding.  Genitourinary:    Comments: No cva tenderness. Musculoskeletal:        General: No swelling or tenderness.     Cervical back: Normal range of motion and neck supple. No rigidity.     Right lower leg: No edema.     Left lower leg: No edema.  Skin:    General: Skin is warm and dry.     Findings: No rash.  Neurological:     Mental Status: He is alert.     Comments: Alert, speech clear.   Psychiatric:        Mood and Affect: Mood normal.     ED Results / Procedures / Treatments   Labs (all labs ordered are listed, but only abnormal results are displayed) Results for orders placed or performed during the hospital encounter of 53/97/67  Basic metabolic panel  Result Value Ref Range   Sodium 138 135 - 145 mmol/L   Potassium 4.1 3.5 - 5.1 mmol/L  Chloride 105 98 - 111 mmol/L   CO2 28 22 - 32 mmol/L   Glucose, Bld 70 70 - 99 mg/dL   BUN 7 6 - 20 mg/dL   Creatinine, Ser 1.18 0.61 - 1.24 mg/dL    Calcium 9.1 8.9 - 10.3 mg/dL   GFR, Estimated >60 >60 mL/min   Anion gap 5 5 - 15  CBC  Result Value Ref Range   WBC 5.7 4.0 - 10.5 K/uL   RBC 4.92 4.22 - 5.81 MIL/uL   Hemoglobin 14.1 13.0 - 17.0 g/dL   HCT 45.1 39.0 - 52.0 %   MCV 91.7 80.0 - 100.0 fL   MCH 28.7 26.0 - 34.0 pg   MCHC 31.3 30.0 - 36.0 g/dL   RDW 14.0 11.5 - 15.5 %   Platelets 247 150 - 400 K/uL   nRBC 0.0 0.0 - 0.2 %  Troponin I (High Sensitivity)  Result Value Ref Range   Troponin I (High Sensitivity) 15 <18 ng/L   DG Chest 2 View  Result Date: 11/24/2020 CLINICAL DATA:  Chest pain. EXAM: CHEST - 2 VIEW COMPARISON:  09/26/2020. FINDINGS: Mediastinum and hilar structures normal. Cardiomegaly. No pulmonary venous congestion. Low lung volumes. No focal infiltrate. No pleural effusion or pneumothorax. IMPRESSION: Low lung volumes.  No acute cardiopulmonary disease. Electronically Signed   By: Marcello Moores  Register   On: 11/24/2020 13:18    EKG EKG Interpretation  Date/Time:  Tuesday Nov 24 2020 12:10:58 EDT Ventricular Rate:  84 PR Interval:  180 QRS Duration: 80 QT Interval:  366 QTC Calculation: 432 R Axis:   -1 Text Interpretation: Normal sinus rhythm Non-specific ST-t changes No significant change since last tracing Confirmed by Lajean Saver [phone]) on 11/24/2020 2:19:38 PM   Radiology DG Chest 2 View  Result Date: 11/24/2020 CLINICAL DATA:  Chest pain. EXAM: CHEST - 2 VIEW COMPARISON:  09/26/2020. FINDINGS: Mediastinum and hilar structures normal. Cardiomegaly. No pulmonary venous congestion. Low lung volumes. No focal infiltrate. No pleural effusion or pneumothorax. IMPRESSION: Low lung volumes.  No acute cardiopulmonary disease. Electronically Signed   By: Marcello Moores  Register   On: 11/24/2020 13:18    Procedures Procedures   Medications Ordered in ED Medications  famotidine (PEPCID) tablet 20 mg (has no administration in time range)  alum & mag hydroxide-simeth (MAALOX/MYLANTA) 200-200-20 MG/5ML  suspension 30 mL (has no administration in time range)  acetaminophen (TYLENOL) tablet 1,000 mg (has no administration in time range)    ED Course  I have reviewed the triage vital signs and the nursing notes.  Pertinent labs & imaging results that were available during my care of the patient were reviewed by me and considered in my medical decision making (see chart for details).    MDM Rules/Calculators/A&P                         Labs sent. Imaging ordered. Ecg.   Reviewed nursing notes and prior charts for additional history.  History non-ischemic CM, with no significant obstructive disease on cath last year.   Pepcid po, maalox po, acetaminophen po for symptom relief.  Pt asking for food/drink.   Labs reviewed/interpreted by me - after symptoms presents, constant for past day+, trop is normal. Felt not c/w acs.   Pt currently denies cp or sob. Pt is afebrile. No increased wob. Pt currently appears stable for d/c.   Rec f/u w his doctors. Return precautions provided.      Final Clinical Impression(s) /  ED Diagnoses Final diagnoses:  None    Rx / DC Orders ED Discharge Orders    None       Lajean Saver, MD 11/24/20 [phone]

## 2020-12-01 DIAGNOSIS — Z20822 Contact with and (suspected) exposure to covid-19: Secondary | ICD-10-CM | POA: Diagnosis not present

## 2020-12-04 ENCOUNTER — Encounter: Payer: Self-pay | Admitting: Internal Medicine

## 2020-12-04 ENCOUNTER — Ambulatory Visit (INDEPENDENT_AMBULATORY_CARE_PROVIDER_SITE_OTHER): Payer: Medicare Other | Admitting: Internal Medicine

## 2020-12-04 ENCOUNTER — Encounter (INDEPENDENT_AMBULATORY_CARE_PROVIDER_SITE_OTHER): Payer: Medicare Other | Admitting: Ophthalmology

## 2020-12-04 VITALS — BP 100/58 | HR 68 | Ht 66.0 in | Wt 171.6 lb

## 2020-12-04 DIAGNOSIS — H35033 Hypertensive retinopathy, bilateral: Secondary | ICD-10-CM

## 2020-12-04 DIAGNOSIS — Z86010 Personal history of colonic polyps: Secondary | ICD-10-CM

## 2020-12-04 DIAGNOSIS — E113413 Type 2 diabetes mellitus with severe nonproliferative diabetic retinopathy with macular edema, bilateral: Secondary | ICD-10-CM

## 2020-12-04 DIAGNOSIS — H3581 Retinal edema: Secondary | ICD-10-CM

## 2020-12-04 DIAGNOSIS — H25813 Combined forms of age-related cataract, bilateral: Secondary | ICD-10-CM

## 2020-12-04 DIAGNOSIS — K5909 Other constipation: Secondary | ICD-10-CM | POA: Diagnosis not present

## 2020-12-04 DIAGNOSIS — I10 Essential (primary) hypertension: Secondary | ICD-10-CM

## 2020-12-04 MED ORDER — BISACODYL 5 MG PO TBEC: 10.0000 mg | DELAYED_RELEASE_TABLET | ORAL | 0 refills | Status: DC

## 2020-12-04 NOTE — Progress Notes (Signed)
Calvin [name] 60 y.o. 04/08/1961 [id number]  Assessment & Plan:   Encounter Diagnoses  Name Primary?  . Chronic constipation Yes  . Personal history of colonic polyps    I am going to add Dulcolax as below.  Continue MiraLAX see me in a couple of months to regroup.  I do not think we need to be in a hurry to do a colonoscopy given his comorbidities. Meds ordered this encounter  Medications  . bisacodyl (DULCOLAX) 5 MG EC tablet    Sig: Take 2 tablets (10 mg total) by mouth every other day. Take at bedtime    Dispense:  30 tablet    Refill:  0    I appreciate the opportunity to care for this patient. CC: [name], NP   Subjective:   Chief Complaint: Chronic constipation  HPI Patient is here for follow-up after he had a failed attempted colonoscopy on 10/06/2020, indication was history of colon polyps, he had formed stool in the rectum.  He also has significant cardiac comorbidities.  He had 5 adenomas removed in 2019.  He was in the emergency department on 524 with chest pain.  Additionally  had cough.  Troponin negative CBC normal EKG with nonspecific ST changes normal be met  Was seen in heart failure clinic 11/11/2020.  Chronic dyspnea on exertion was there at the time.  He was able to walk several blocks to his clinic appointment.  He continues to struggle with constipation despite taking MiraLAX twice a day.  It does not seem to really make a big difference he thinks.  He is awaiting an echocardiogram as part of his heart failure follow-up. No Known Allergies Current Meds  Medication Sig  . albuterol (VENTOLIN HFA) 108 (90 Base) MCG/ACT inhaler Inhale 2 puffs into the lungs every 6 (six) hours as needed for wheezing or shortness of breath.  [name] amitriptyline (ELAVIL) 10 MG tablet Take 2 tablets at bed time  . atorvastatin (LIPITOR) 80 MG tablet Take 1 tablet (80 mg total) by mouth daily. (Patient taking differently: Take 80 mg by mouth every evening.)   . carvedilol (COREG) 6.25 MG tablet Take 1 tablet (6.25 mg total) by mouth 2 (two) times daily with a meal.  . dapagliflozin propanediol (FARXIGA) 10 MG TABS tablet Take 1 tablet (10 mg total) by mouth daily before breakfast.  . donepezil (ARICEPT) 10 MG tablet Take 1 tablet (10 mg total) by mouth at bedtime.  [name] ELIQUIS 5 MG TABS tablet TAKE 1 TABLET BY MOUTH TWICE DAILY  . ezetimibe (ZETIA) 10 MG tablet Take 1 tablet (10 mg total) by mouth daily.  . insulin degludec (TRESIBA FLEXTOUCH) 200 UNIT/ML FlexTouch Pen Inject 52 Units into the skin daily.  . Insulin Syringe-Needle U-100 (INSULIN SYRINGE .3CC/29GX1/2") 29G X 1/2" 0.3 ML MISC Inject 1 Syringe 3 (three) times daily as directed. Check blood sugar three times daily. Dx: E11.9 (Patient taking differently: Inject 1 Syringe as directed 3 (three) times daily. Check blood sugar three times daily. Dx: E11.9)  . Lancets (ONETOUCH DELICA PLUS [id number]) MISC USE TWICE DAILY  . lidocaine (LMX) 4 % cream Apply 1 application topically 3 (three) times daily as needed.  . nitroGLYCERIN (NITROSTAT) 0.4 MG SL tablet Place 1 tablet (0.4 mg total) under the tongue every 5 (five) minutes as needed for chest pain.  [name] ULTRA test strip USE TWICE DAILY  . polyethylene glycol (MIRALAX / GLYCOLAX) 17 g packet Take 17 g by mouth 2 (two) times  daily.  [name] REPATHA 140 MG/ML SOSY INJECT 1 PEN INTO THE SKIN EVERY 14 DAYS  . sacubitril-valsartan (ENTRESTO) 24-26 MG Take 1 tablet by mouth 2 (two) times daily.  [name] spironolactone (ALDACTONE) 25 MG tablet Take 1 tablet (25 mg total) by mouth daily.  [name] triamcinolone (KENALOG) 0.1 % Apply 1 application topically 3 (three) times daily as needed. For itching   Past Medical History:  Diagnosis Date  . Adenoidal hypertrophy   . Adenomatous colon polyps 2012  . AKI (acute kidney injury) ([place]) 06/17/2019  . Arthritis   . Cataract    Mixed OU  . COVID-19 virus detected 06/18/2019  . Diabetes mellitus   . Diabetic  retinopathy ([place])    NPDR OU  . GERD (gastroesophageal reflux disease)   . Heart attack ([place])    [place]  [name] Hx of adenomatous colonic polyps 08/18/2017  . Hyperlipidemia   . Hypertension   . Hypertensive retinopathy    OU  . Mild CAD    a. cath in 08/2017 showing mild nonobstructive CAD with scattered 20-30% stenosis.   . Nonischemic cardiomyopathy ([place])    a. EF 20-25% by echo in 09/2017 with cath showing mild CAD. b.  Last echo 12/2017 EF 35-40%, grade 2 DD.  [name] Obesity   . PAF (paroxysmal atrial fibrillation) ([place])   . Sleep apnea    cpap, pt says no longer has   Past Surgical History:  Procedure Laterality Date  . COLONOSCOPY  05/13/11, 08/2017   9 adenomas  . FOREARM SURGERY    . MUSCLE BIOPSY    . POLYPECTOMY    . RIGHT/LEFT HEART CATH AND CORONARY ANGIOGRAPHY N/A 08/25/2017   Procedure: RIGHT/LEFT HEART CATH AND CORONARY ANGIOGRAPHY;  Surgeon: [name], MD;  Location: [place] [facility];  Service: Cardiovascular;  Laterality: N/A;  . RIGHT/LEFT HEART CATH AND CORONARY ANGIOGRAPHY N/A 10/01/2019   Procedure: RIGHT/LEFT HEART CATH AND CORONARY ANGIOGRAPHY;  Surgeon: [name], MD;  Location: [place] [facility];  Service: Cardiovascular;  Laterality: N/A;   Social History   Social History Narrative   Social History   Diet?    Do you drink/eat things with caffeine? yes   Marital status?       single      Do you live in a house, apartment, assisted living, condo, trailer, etc.? yes   Is it one or more stories? One story   How many persons live in your home?   Do you have any pets in your home? (please list)    Highest level of education completed? graduate   Do you exercise?            no                          Type & how often?   Advanced Directives   Do you have a living will? no   Do you have a DNR form?                                  If not, do you want to discuss one? no   Do you have signed POA/HPOA for forms? no       Functional Status   Do you have difficulty bathing or dressing yourself? no   Do you have difficulty preparing food or eating? no  Do you have difficulty managing your medications? no   Do you have difficulty managing your finances? no   Do you have difficulty affording your medications? no   family history includes Diabetes in his father and mother; Heart attack (age of onset: 77) in his mother; Heart attack (age of onset: 40) in his sister; Heart attack (age of onset: 54) in his father; Heart disease in his father and mother; Hyperlipidemia in his father and mother; Hypertension in his father and mother.   Review of Systems As above  Objective:   Physical Exam BP (!) 100/58   Pulse 68   Ht _0  (1.676 m)   Wt 171 lb 9.6 oz (77.8 kg)   SpO2 99%   BMI 27.70 kg/m  Chronically ill NAD Lungs cta abd soft mild tenderness RLA Rectal - formed brown stool, no mass no impaction

## 2020-12-04 NOTE — Patient Instructions (Addendum)
  If you are age 61 or younger, your body mass index should be between 19-25. Your Body mass index is 27.7 kg/m. If this is out of the aformentioned range listed, please consider follow up with your Primary Care Provider.   __________________________________________________________  The [facility] providers would like to encourage you to use [facility] to communicate with providers for non-urgent requests or questions.  Due to long hold times on the telephone, sending your provider a message by [facility] may be a faster and more efficient way to get a response.  Please allow 48 business hours for a response.  Please remember that this is for non-urgent requests.    Please take 2 over the counter Dulcolax every other night.  Continue your miralax.  I appreciate the opportunity to care for you. Silvano Rusk, MD, [facility]

## 2020-12-17 ENCOUNTER — Other Ambulatory Visit: Payer: Self-pay | Admitting: *Deleted

## 2020-12-17 ENCOUNTER — Other Ambulatory Visit: Payer: Self-pay

## 2020-12-17 NOTE — Patient Outreach (Signed)
[facility]) Care Management  [place]  12/17/2020   Calvin [name] [birth year] [id number]  Subjective: Telephone call to patient for disease management follow up. Patient reports he is doing good. He is on his way to social services about his food stamps. He now is back to [facility].  Discussed transitioning to health coach.  He is agreeable.  Sugars remain high per patient in 200-300 range.  Discussed diabetes control and why it is so important.  He verbalized understanding.    Objective:   Encounter Medications:  Outpatient Encounter Medications as of 12/17/2020  Medication Sig   albuterol (VENTOLIN HFA) 108 (90 Base) MCG/ACT inhaler Inhale 2 puffs into the lungs every 6 (six) hours as needed for wheezing or shortness of breath.   amitriptyline (ELAVIL) 10 MG tablet Take 2 tablets at bed time   atorvastatin (LIPITOR) 80 MG tablet Take 1 tablet (80 mg total) by mouth daily. (Patient taking differently: Take 80 mg by mouth every evening.)   bisacodyl (DULCOLAX) 5 MG EC tablet Take 2 tablets (10 mg total) by mouth every other day. Take at bedtime   carvedilol (COREG) 6.25 MG tablet Take 1 tablet (6.25 mg total) by mouth 2 (two) times daily with a meal.   dapagliflozin propanediol (FARXIGA) 10 MG TABS tablet Take 1 tablet (10 mg total) by mouth daily before breakfast.   donepezil (ARICEPT) 10 MG tablet Take 1 tablet (10 mg total) by mouth at bedtime.   ELIQUIS 5 MG TABS tablet TAKE 1 TABLET BY MOUTH TWICE DAILY   ezetimibe (ZETIA) 10 MG tablet Take 1 tablet (10 mg total) by mouth daily.   insulin degludec (TRESIBA FLEXTOUCH) 200 UNIT/ML FlexTouch Pen Inject 52 Units into the skin daily.   Insulin Syringe-Needle U-100 (INSULIN SYRINGE .3CC/29GX1/2") 29G X 1/2" 0.3 ML MISC Inject 1 Syringe 3 (three) times daily as directed. Check blood sugar three times daily. Dx: E11.9 (Patient taking differently: Inject 1 Syringe as directed 3 (three) times daily. Check  blood sugar three times daily. Dx: E11.9)   Lancets (ONETOUCH DELICA PLUS [id number]) MISC USE TWICE DAILY   lidocaine (LMX) 4 % cream Apply 1 application topically 3 (three) times daily as needed.   nitroGLYCERIN (NITROSTAT) 0.4 MG SL tablet Place 1 tablet (0.4 mg total) under the tongue every 5 (five) minutes as needed for chest pain.   ONETOUCH ULTRA test strip USE TWICE DAILY   polyethylene glycol (MIRALAX / GLYCOLAX) 17 g packet Take 17 g by mouth 2 (two) times daily.   REPATHA 140 MG/ML SOSY INJECT 1 PEN INTO THE SKIN EVERY 14 DAYS   sacubitril-valsartan (ENTRESTO) 24-26 MG Take 1 tablet by mouth 2 (two) times daily.   spironolactone (ALDACTONE) 25 MG tablet Take 1 tablet (25 mg total) by mouth daily.   triamcinolone (KENALOG) 0.1 % Apply 1 application topically 3 (three) times daily as needed. For itching   No facility-administered encounter medications on file as of 12/17/2020.    Functional Status:  In your present state of health, do you have any difficulty performing the following activities: 09/24/2020 07/23/2020  Hearing? N -  Vision? N Y  Difficulty concentrating or making decisions? Y Y  Comment some memory issues -  Walking or climbing stairs? Y Y  Comment patient reports some shortness of breath. -  Dressing or bathing? N N  Doing errands, shopping? Tempie Donning  Comment family assists does not drive often  Preparing Food and eating ? N N  Using the Toilet? N N  In the past six months, have you accidently leaked urine? N Y  Do you have problems with loss of bowel control? N N  Managing your Medications? Y Y  Comment paramedicine sees patient for med fills -  Managing your Finances? Tempie Donning  Comment family assists -  Housekeeping or managing your Housekeeping? Tempie Donning  Comment family assists -  Some recent data might be hidden    Fall/Depression Screening: Fall Risk  09/24/2020 07/23/2020 07/13/2020  Falls in the past year? 0 1 0  Number falls in past yr: - 1 0  Injury with Fall? -  0 -  Risk for fall due to : - - -  Follow up - - -   PHQ 2/9 Scores 12/17/2020 09/24/2020 07/23/2020 07/01/2020 05/25/2020 07/15/2019 05/01/2019  PHQ - 2 Score 0 0 0 1 0 0 0    Assessment:   Care Plan Care Plan : Diabetes Type 2 (Adult)  Updates made by Jon Billings, RN since 12/17/2020 12:00 AM     Problem: Glycemic Management (Diabetes, Type 2)      Long-Range Goal: Glycemic Management Optimized as evidenced by A1c less than 8.4   Start Date: 04/17/2020  Expected End Date: 07/03/2021  This Visit's Progress: Not on track  Recent Progress: On track  Priority: High  Note:   Evidence-based guidance:  Anticipate A1C testing (point-of-care) every 3 to 6 months based on goal attainment.  Review mutually-set A1C goal or target range.   Notes:     Task: Alleviate Barriers to Glycemic Management   Due Date: 07/03/2021  Priority: Routine  Responsible User: Jon Billings, RN  Note:   Care Management Activities:    - blood glucose monitoring encouraged - blood glucose readings reviewed    Notes: 08/26/20 patient A1c 8.4 last check.  Keep up the great work!! 09/24/20 Patient reports sugars are in the 300 range. Encouraged diabetic diet.   12/17/20 Patient sugars remain high with A1c 8.9.  Encouraged limiting carbs and sweets.  12/17/20 Patient a1c up to 8.9.  Sugars ranging in the 240-300 range per patient.        Goals Addressed             This Visit's Progress    [facility] - Monitor and Manage My Blood Sugar   On track    Barriers:Health Behaviors Knowledge Timeframe:  Long-Range Goal Priority:  High Start Date:  2/23//                           Expected End Date:  03/03/21                    Follow Up Date 10/31/20   - check blood sugar at prescribed times - take the blood sugar log to all doctor visits    Why is this important?   Checking your blood sugar at home helps to keep it from getting very high or very low.  Writing the results in a diary or log helps the doctor  know how to care for you.  Your blood sugar log should have the time, date and the results.  Also, write down the amount of insulin or other medicine that you take.  Other information, like what you ate, exercise done and how you were feeling, will also be helpful.     Notes:   11/8 - Reminded to monitor multiple times a  day with insulin changes. Patient checks sugars regularly- patient reports sugars tend to be high in the am.    12/9 - Reviewed insulin changes as well as appointment with nutritionist  09/24/20 Patient sugars remain in the 300 range last A1c 8.4.  Encouraged patient to really watch his diet and limit sweets and carbohydrate containing food. 12/17/20 A1c up to 8.9 encouraged better sugar control.  Patient remains active through walking.   Diabetes Management Discussed: Medication adherence Reviewed importance of limiting carbs such as rice, potatoes, breads, and pastas. Also discussed limiting sweets and sugary drinks.  Discussed importance of portion control.  Also discussed importance of annual exams such as eye exam.         COMPLETED: [facility] - Set My Target A1C       Follow Up Date 10/31/20   - set target A1C - Less than 8   Why is this important?   Your target A1C is decided together by you and your doctor.  It is based on several things like your age and other health issues.    Notes:   11/8 - decreased to 9.3 on 04/22/20  12/9 - decreased to 8.9 in December 2021  08/26/20- A1c 8.4 08-11-20  09/24/20 patient continues to work at lowering A1c.          Plan:  Follow-up: Patient agrees to Care Plan and Follow-up. RN CM will transfer to health coach for disease management.  Assigned nurse will follow up as scheduled.  Jone Baseman, RN, MSN [place] [facility] Care Management Coordinator Direct Line [phone] Cell [phone] Toll Free: [phone]  Fax: [phone]

## 2020-12-18 ENCOUNTER — Ambulatory Visit: Payer: Self-pay

## 2020-12-22 ENCOUNTER — Encounter (INDEPENDENT_AMBULATORY_CARE_PROVIDER_SITE_OTHER): Payer: Medicare Other | Admitting: Ophthalmology

## 2020-12-29 ENCOUNTER — Other Ambulatory Visit (HOSPITAL_COMMUNITY): Payer: Self-pay

## 2020-12-29 NOTE — Progress Notes (Signed)
Paramedicine Encounter    Patient ID: Calvin [name], male    DOB: Feb 04, 1961, 60 y.o.   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/2020   Intermittent claudication ([place]) 05/25/2020   Hypoglycemia due to insulin 10/15/2019   Chronic anticoagulation 10/15/2019   Right sided weakness 06/17/2019   Foot pain, bilateral 02/01/2019   Overgrown toenails 12/20/2018   Unsteady gait 12/20/2018   Encounter for therapeutic drug monitoring 10/22/2018   AF (paroxysmal atrial fibrillation) ([place]) 03/10/2018   Diabetic neuropathy ([place]) 09/26/2017   MCI (mild cognitive impairment) 06/23/2017   OSA on CPAP 05/10/2017   Gastroesophageal reflux disease 05/10/2017   Insomnia 05/10/2017   Onychomycosis of multiple toenails with type 2 diabetes mellitus and peripheral neuropathy ([place]) 03/00/9233   Chronic systolic heart failure ([place]) 11/22/2015   Essential hypertension 11/22/2015   DM (diabetes mellitus) ([place]) 11/20/2015   HLD (hyperlipidemia) 11/20/2015   Hx of adenomatous colonic polyps 05/19/2011    Current Outpatient Medications:    albuterol (VENTOLIN HFA) 108 (90 Base) MCG/ACT inhaler, Inhale 2 puffs into the lungs every 6 (six) hours as needed for wheezing or shortness of breath., Disp: 6.7 g, Rfl: 0   atorvastatin (LIPITOR) 80 MG tablet, Take 1 tablet (80 mg total) by mouth daily. (Patient taking differently: Take 80 mg by mouth every evening.), Disp: 90 tablet, Rfl: 3   bisacodyl (DULCOLAX) 5 MG EC tablet, Take 2 tablets (10 mg  total) by mouth every other day. Take at bedtime, Disp: 30 tablet, Rfl: 0   carvedilol (COREG) 6.25 MG tablet, Take 1 tablet (6.25 mg total) by mouth 2 (two) times daily with a meal., Disp: 180 tablet, Rfl: 3   ELIQUIS 5 MG TABS tablet, TAKE 1 TABLET BY MOUTH TWICE DAILY, Disp: 60 tablet, Rfl: 5   ezetimibe (ZETIA) 10 MG tablet, Take 1 tablet (10 mg total) by mouth daily., Disp: 90 tablet, Rfl: 2   insulin degludec (TRESIBA FLEXTOUCH) 200 UNIT/ML FlexTouch Pen, Inject 52 Units into the skin daily., Disp: 18 mL, Rfl: 3   Insulin Syringe-Needle U-100 (INSULIN SYRINGE .3CC/29GX1/2") 29G X 1/2" 0.3 ML MISC, Inject 1 Syringe 3 (three) times daily as directed. Check blood sugar three times daily. Dx: E11.9 (Patient taking differently: Inject 1 Syringe as directed 3 (three) times daily. Check blood sugar three times daily. Dx: E11.9), Disp: 100 each, Rfl: 3   Lancets (ONETOUCH DELICA PLUS [id number]) MISC, USE TWICE DAILY, Disp: 200 each, Rfl: 3   lidocaine (LMX) 4 % cream, Apply 1 application topically 3 (three) times daily as needed., Disp: 30 g, Rfl: 0   ONETOUCH ULTRA test strip, USE TWICE DAILY, Disp: 200 strip, Rfl: 3   polyethylene glycol (MIRALAX / GLYCOLAX) 17 g packet, Take 17 g by mouth 2 (two) times daily., Disp: 14 each, Rfl: 0   REPATHA 140 MG/ML SOSY, INJECT 1 PEN INTO THE SKIN EVERY 14 DAYS, Disp: 6 mL, Rfl: 3   sacubitril-valsartan (ENTRESTO) 24-26 MG, Take 1 tablet by mouth 2 (two) times daily., Disp: 60 tablet, Rfl: 11   spironolactone (ALDACTONE) 25 MG  tablet, Take 1 tablet (25 mg total) by mouth daily., Disp: 90 tablet, Rfl: 3   triamcinolone (KENALOG) 0.1 %, Apply 1 application topically 3 (three) times daily as needed. For itching, Disp: 45 g, Rfl: 1   amitriptyline (ELAVIL) 10 MG tablet, Take 2 tablets at bed time (Patient not taking: Reported on 12/29/2020), Disp: 180 tablet, Rfl: 3   dapagliflozin propanediol (FARXIGA) 10 MG TABS tablet, Take 1 tablet (10 mg total) by mouth daily  before breakfast. (Patient not taking: Reported on 12/29/2020), Disp: 90 tablet, Rfl: 3   donepezil (ARICEPT) 10 MG tablet, Take 1 tablet (10 mg total) by mouth at bedtime. (Patient not taking: Reported on 12/29/2020), Disp: 90 tablet, Rfl: 3   nitroGLYCERIN (NITROSTAT) 0.4 MG SL tablet, Place 1 tablet (0.4 mg total) under the tongue every 5 (five) minutes as needed for chest pain. (Patient not taking: Reported on 12/29/2020), Disp: 30 tablet, Rfl: 1 No Known Allergies    Social History   Socioeconomic History   Marital status: Single    Spouse name: Not on file   Number of children: 0   Years of education: Not on file   Highest education level: Not on file  Occupational History   Occupation: Disability  Tobacco Use   Smoking status: Never   Smokeless tobacco: Never  Vaping Use   Vaping Use: Never used  Substance and Sexual Activity   Alcohol use: No   Drug use: No   Sexual activity: Yes    Birth control/protection: None  Other Topics Concern   Not on file  Social History Narrative   Social History   Diet?    Do you drink/eat things with caffeine? yes   Marital status?       single      Do you live in a house, apartment, assisted living, condo, trailer, etc.? yes   Is it one or more stories? One story   How many persons live in your home?   Do you have any pets in your home? (please list)    Highest level of education completed? graduate   Do you exercise?            no                          Type & how often?   Advanced Directives   Do you have a living will? no   Do you have a DNR form?                                  If not, do you want to discuss one? no   Do you have signed POA/HPOA for forms? no      Functional Status   Do you have difficulty bathing or dressing yourself? no   Do you have difficulty preparing food or eating? no   Do you have difficulty managing your medications? no   Do you have difficulty managing your finances? no   Do you have difficulty  affording your medications? no   Social Determinants of Health   Financial Resource Strain: Not on file  Food Insecurity: No Food Insecurity   Worried About Running Out of Food in the Last Year: Never true   Ran Out of Food in the Last Year: Never true  Transportation Needs: No Transportation Needs   Lack of Transportation (Medical): No  Lack of Transportation (Non-Medical): No  Physical Activity: Not on file  Stress: Not on file  Social Connections: Not on file  Intimate Partner Violence: Not on file    Physical Exam      Future Appointments  Date Time Provider [place]  01/05/2021  9:30 AM [name], MD TRE-TRE None  01/14/2021  1:00 PM [name], MD LBPC-LBENDO None  01/15/2021 11:00 AM [name], NP PSC-PSC None  02/08/2021  1:00 PM [place] ECHO OP 1 [facility]ECHOL[facility]  02/08/2021  2:00 PM [name], [name], MD MC-HVSC None  02/10/2021  9:30 AM [name], MD LBGI-GI [facility]  02/15/2021 11:45 AM Pleasant, Eppie Gibson, RN THN-CCC None  07/08/2021  8:30 AM Cameron Sprang, MD LBN-LBNG None    BP (!) 150/78   Pulse 78   Resp 18   Wt 177 lb (80.3 kg)   SpO2 99%   BMI 28.57 kg/m  CBG PTA-320 Weight yesterday-? Last visit weight-170  Pt reports he is doing ok. He denies increased sob, no c/p since his last ER visit. He still gets light headed at times, appetite not that great. His CBG's still elevated.  He went out of town last week.  Meds reviewed- He does not have the farxiga. Looks like it was sent out to [facility].  He has to see the neuro doc before refilling the aricept and the amitriptyline.  Meds verifed and 1 month of pill box of meds refilled. [facility] will bring out his farxiga tomor. He will place where it is needed. They will start the pill dose packs next month. I reviewed med list with them and updated his chart with pharmacy.  Will see him back in a month.  He will f/u with PCP about hearing tests.   Marylouise Stacks,  [place] Paramedic  12/29/20

## 2021-01-05 ENCOUNTER — Encounter (INDEPENDENT_AMBULATORY_CARE_PROVIDER_SITE_OTHER): Payer: Medicare Other | Admitting: Ophthalmology

## 2021-01-13 ENCOUNTER — Telehealth: Payer: Self-pay | Admitting: Cardiovascular Disease

## 2021-01-13 DIAGNOSIS — Z20822 Contact with and (suspected) exposure to covid-19: Secondary | ICD-10-CM | POA: Diagnosis not present

## 2021-01-13 MED ORDER — EZETIMIBE 10 MG PO TABS: 10.0000 mg | ORAL_TABLET | Freq: Every day | ORAL | 0 refills | Status: DC

## 2021-01-13 NOTE — Telephone Encounter (Signed)
Pt's medication was sent to pt's pharmacy as requested. Confirmation received.  °

## 2021-01-13 NOTE — Telephone Encounter (Signed)
*  STAT* If patient is at the pharmacy, call can be transferred to refill team.   1. Which medications need to be refilled? (please list name of each medication and dose if known) ezetimibe (ZETIA) 10 MG tablet  2. Which pharmacy/location (including street and city if local pharmacy) is medication to be sent to? [facility] - [place], [place] - [place] [address]  3. Do they need a 30 day or 90 day supply? 30 day   Patient's delivery is next Monday

## 2021-01-14 ENCOUNTER — Other Ambulatory Visit: Payer: Self-pay

## 2021-01-14 ENCOUNTER — Telehealth: Payer: Self-pay

## 2021-01-14 ENCOUNTER — Ambulatory Visit (INDEPENDENT_AMBULATORY_CARE_PROVIDER_SITE_OTHER): Payer: Medicare Other | Admitting: Endocrinology

## 2021-01-14 ENCOUNTER — Encounter: Payer: Self-pay | Admitting: Endocrinology

## 2021-01-14 VITALS — BP 148/88 | HR 82 | Ht 66.0 in | Wt 177.0 lb

## 2021-01-14 DIAGNOSIS — Z794 Long term (current) use of insulin: Secondary | ICD-10-CM | POA: Diagnosis not present

## 2021-01-14 DIAGNOSIS — E1140 Type 2 diabetes mellitus with diabetic neuropathy, unspecified: Secondary | ICD-10-CM | POA: Diagnosis not present

## 2021-01-14 LAB — POCT GLYCOSYLATED HEMOGLOBIN (HGB A1C): Hemoglobin A1C: 7.3 % — AB (ref 4.0–5.6)

## 2021-01-14 NOTE — Progress Notes (Signed)
Subjective:    Patient ID: Calvin [name], male    DOB: 14-Feb-1961, 60 y.o.   MRN: [id number]  HPI Pt returns for f/u of diabetes mellitus:  DM type: Insulin-requiring type 2. Dx'ed: 3875 Complications: PN, CAD, CRI, and DR.   Therapy: insulin since soon after dx, and Farxiga.    DKA: never Severe hypoglycemia: once, in 2021.   Pancreatitis: never Pancreatic imaging: normal on 2003 CT. SDOH: due to noncompliance, he is not a candidate for multiple daily injections; he is retired; he gets insulin from pt assistance; changes between brands must be minimized, due to LHL.   Other: up to early 2021, he was taking more than 200 units QD; pt requests that insulin be titrated up very slowly, due to h/o severe hypoglycemia.    Interval history: no cbg record, but states cbg's vary from 200-250.  He says he takes insulin as rx'ed.  Pt says he never misses the insulin.   Past Medical History:  Diagnosis Date   Adenoidal hypertrophy    Adenomatous colon polyps 2012   AKI (acute kidney injury) ([place]) 06/17/2019   Arthritis    Cataract    Mixed OU   COVID-19 virus detected 06/18/2019   Diabetes mellitus    Diabetic retinopathy ([place])    NPDR OU   GERD (gastroesophageal reflux disease)    Heart attack [place])    [place]   Hx of adenomatous colonic polyps 08/18/2017   Hyperlipidemia    Hypertension    Hypertensive retinopathy    OU   Mild CAD    a. cath in 08/2017 showing mild nonobstructive CAD with scattered 20-30% stenosis.    Nonischemic cardiomyopathy ([place])    a. EF 20-25% by echo in 09/2017 with cath showing mild CAD. b.  Last echo 12/2017 EF 35-40%, grade 2 DD.   Obesity    PAF (paroxysmal atrial fibrillation) ([place])    Sleep apnea    cpap, pt says no longer has    Past Surgical History:  Procedure Laterality Date   COLONOSCOPY  05/13/11, 08/2017   9 adenomas   FOREARM SURGERY     MUSCLE BIOPSY     POLYPECTOMY     RIGHT/LEFT HEART CATH AND CORONARY ANGIOGRAPHY  N/A 08/25/2017   Procedure: RIGHT/LEFT HEART CATH AND CORONARY ANGIOGRAPHY;  Surgeon: [name], MD;  Location: [facility];  Service: Cardiovascular;  Laterality: N/A;   RIGHT/LEFT HEART CATH AND CORONARY ANGIOGRAPHY N/A 10/01/2019   Procedure: RIGHT/LEFT HEART CATH AND CORONARY ANGIOGRAPHY;  Surgeon: [name], MD;  Location: [facility];  Service: Cardiovascular;  Laterality: N/A;    Social History   Socioeconomic History   Marital status: Single    Spouse name: Not on file   Number of children: 0   Years of education: Not on file   Highest education level: Not on file  Occupational History   Occupation: Disability  Tobacco Use   Smoking status: Never   Smokeless tobacco: Never  Vaping Use   Vaping Use: Never used  Substance and Sexual Activity   Alcohol use: No   Drug use: No   Sexual activity: Yes    Birth control/protection: None  Other Topics Concern   Not on file  Social History Narrative   Social History   Diet?    Do you drink/eat things with caffeine? yes   Marital status?       single      Do  you live in a house, apartment, assisted living, condo, trailer, etc.? yes   Is it one or more stories? One story   How many persons live in your home?   Do you have any pets in your home? (please list)    Highest level of education completed? graduate   Do you exercise?            no                          Type & how often?   Advanced Directives   Do you have a living will? no   Do you have a DNR form?                                  If not, do you want to discuss one? no   Do you have signed POA/HPOA for forms? no      Functional Status   Do you have difficulty bathing or dressing yourself? no   Do you have difficulty preparing food or eating? no   Do you have difficulty managing your medications? no   Do you have difficulty managing your finances? no   Do you have difficulty affording your medications? no   Social  Determinants of Health   Financial Resource Strain: Not on file  Food Insecurity: No Food Insecurity   Worried About Running Out of Food in the Last Year: Never true   Ran Out of Food in the Last Year: Never true  Transportation Needs: No Transportation Needs   Lack of Transportation (Medical): No   Lack of Transportation (Non-Medical): No  Physical Activity: Not on file  Stress: Not on file  Social Connections: Not on file  Intimate Partner Violence: Not on file    Current Outpatient Medications on File Prior to Visit  Medication Sig Dispense Refill   albuterol (VENTOLIN HFA) 108 (90 Base) MCG/ACT inhaler Inhale 2 puffs into the lungs every 6 (six) hours as needed for wheezing or shortness of breath. 6.7 g 0   atorvastatin (LIPITOR) 80 MG tablet Take 1 tablet (80 mg total) by mouth daily. (Patient taking differently: Take 80 mg by mouth every evening.) 90 tablet 3   bisacodyl (DULCOLAX) 5 MG EC tablet Take 2 tablets (10 mg total) by mouth every other day. Take at bedtime 30 tablet 0   carvedilol (COREG) 6.25 MG tablet Take 1 tablet (6.25 mg total) by mouth 2 (two) times daily with a meal. 180 tablet 3   dapagliflozin propanediol (FARXIGA) 10 MG TABS tablet Take 1 tablet (10 mg total) by mouth daily before breakfast. 90 tablet 3   donepezil (ARICEPT) 10 MG tablet Take 1 tablet (10 mg total) by mouth at bedtime. 90 tablet 3   ELIQUIS 5 MG TABS tablet TAKE 1 TABLET BY MOUTH TWICE DAILY 60 tablet 5   ezetimibe (ZETIA) 10 MG tablet Take 1 tablet (10 mg total) by mouth daily. 30 tablet 0   insulin degludec (TRESIBA FLEXTOUCH) 200 UNIT/ML FlexTouch Pen Inject 52 Units into the skin daily. 18 mL 3   Insulin Syringe-Needle U-100 (INSULIN SYRINGE .3CC/29GX1/2") 29G X 1/2" 0.3 ML MISC Inject 1 Syringe 3 (three) times daily as directed. Check blood sugar three times daily. Dx: E11.9 (Patient taking differently: Inject 1 Syringe as directed 3 (three) times daily. Check blood sugar three times daily.  Dx: E11.9) 100 each  3   Lancets (ONETOUCH DELICA PLUS [id number]) MISC USE TWICE DAILY 200 each 3   nitroGLYCERIN (NITROSTAT) 0.4 MG SL tablet Place 1 tablet (0.4 mg total) under the tongue every 5 (five) minutes as needed for chest pain. (Patient not taking: No sig reported) 30 tablet 1   ONETOUCH ULTRA test strip USE TWICE DAILY 200 strip 3   polyethylene glycol (MIRALAX / GLYCOLAX) 17 g packet Take 17 g by mouth 2 (two) times daily. 14 each 0   REPATHA 140 MG/ML SOSY INJECT 1 PEN INTO THE SKIN EVERY 14 DAYS 6 mL 3   sacubitril-valsartan (ENTRESTO) 24-26 MG Take 1 tablet by mouth 2 (two) times daily. 60 tablet 11   spironolactone (ALDACTONE) 25 MG tablet Take 1 tablet (25 mg total) by mouth daily. 90 tablet 3   No current facility-administered medications on file prior to visit.    No Known Allergies  Family History  Problem Relation Age of Onset   Heart disease Mother    Heart attack Mother 61   Hypertension Mother    Hyperlipidemia Mother    Diabetes Mother    Heart disease Father    Heart attack Father 74   Hypertension Father    Hyperlipidemia Father    Diabetes Father    Heart attack Sister 25   Colon cancer Neg Hx    Stomach cancer Neg Hx    Esophageal cancer Neg Hx    Rectal cancer Neg Hx    Colon polyps Neg Hx     BP (!) 148/88   Pulse 82   Ht 5' 6"  (1.676 m)   Wt 177 lb (80.3 kg)   SpO2 98%   BMI 28.57 kg/m    Review of Systems He denies hypoglycemia    Objective:   Physical Exam Pulses: dorsalis pedis intact bilat.   MSK: no deformity of the feet CV: no leg edema Skin:  no ulcer on the feet.  normal color and temp on the feet. Neuro: sensation is intact to touch on the feet  A1c=7.3%     Assessment & Plan:  Insulin-requiring type 2 DM: this is the best control this pt should aim for, given this regimen, which does match insulin to his changing needs throughout the day.  Patient Instructions  Please continue the same insulin and Farxiga.   On  this type of insulin, you should eat meals on a regular schedule.  If a meal is missed or significantly delayed, your blood sugar could go low.   check your blood sugar twice a day.  vary the time of day when you check, between before the 3 meals, and at bedtime.  also check if you have symptoms of your blood sugar being too high or too low.  please keep a record of the readings and bring it to your next appointment here (or you can bring the meter itself).  You can write it on any piece of paper.  please call us sooner if your blood sugar goes below 70, or if you have a lot of readings over 200.   Please come back for a follow-up appointment in 4 months.

## 2021-01-14 NOTE — Patient Instructions (Addendum)
Please continue the same insulin and [place].   On this type of insulin, you should eat meals on a regular schedule.  If a meal is missed or significantly delayed, your blood sugar could go low.   check your blood sugar twice a day.  vary the time of day when you check, between before the 3 meals, and at bedtime.  also check if you have symptoms of your blood sugar being too high or too low.  please keep a record of the readings and bring it to your next appointment here (or you can bring the meter itself).  You can write it on any piece of paper.  please call us sooner if your blood sugar goes below 70, or if you have a lot of readings over 200.   Please come back for a follow-up appointment in 4 months.

## 2021-01-14 NOTE — Telephone Encounter (Signed)
LVM for pt to go online and print out the application for [employer] to help with his [name] and fill out his portion and we will also need a copy of his driver license, insurance card, and his income when he returns the application to [place]. We will then fill out our portion have it signed and faxed out.

## 2021-01-15 ENCOUNTER — Encounter: Payer: Self-pay | Admitting: Nurse Practitioner

## 2021-01-15 ENCOUNTER — Ambulatory Visit (INDEPENDENT_AMBULATORY_CARE_PROVIDER_SITE_OTHER): Payer: Medicare Other | Admitting: Nurse Practitioner

## 2021-01-15 ENCOUNTER — Other Ambulatory Visit: Payer: Self-pay

## 2021-01-15 VITALS — BP 110/80 | HR 85 | Temp 97.0°F | Ht 66.0 in | Wt 178.4 lb

## 2021-01-15 DIAGNOSIS — G3184 Mild cognitive impairment, so stated: Secondary | ICD-10-CM

## 2021-01-15 DIAGNOSIS — I480 Paroxysmal atrial fibrillation: Secondary | ICD-10-CM

## 2021-01-15 DIAGNOSIS — E1140 Type 2 diabetes mellitus with diabetic neuropathy, unspecified: Secondary | ICD-10-CM | POA: Diagnosis not present

## 2021-01-15 DIAGNOSIS — I5022 Chronic systolic (congestive) heart failure: Secondary | ICD-10-CM

## 2021-01-15 DIAGNOSIS — Z794 Long term (current) use of insulin: Secondary | ICD-10-CM

## 2021-01-15 DIAGNOSIS — K5904 Chronic idiopathic constipation: Secondary | ICD-10-CM | POA: Diagnosis not present

## 2021-01-15 DIAGNOSIS — I10 Essential (primary) hypertension: Secondary | ICD-10-CM | POA: Diagnosis not present

## 2021-01-15 DIAGNOSIS — K219 Gastro-esophageal reflux disease without esophagitis: Secondary | ICD-10-CM

## 2021-01-15 DIAGNOSIS — Z7689 Persons encountering health services in other specified circumstances: Secondary | ICD-10-CM

## 2021-01-15 NOTE — Progress Notes (Signed)
Careteam: [name], NP as PCP - General (Geriatric Medicine) [name], MD as PCP - Cardiology (Cardiology) [name], [name], MD as PCP - Advanced Heart Failure (Cardiology) [name], MD as Consulting Physician (Endocrinology) Jorge Ny, LCSW as Social Worker (Licensed Clinical Social Worker) Pleasant, Eppie Gibson, RN as [facility]  PLACE OF SERVICE:  [place] Directive information Does Patient Have a Medical Advance Directive?: No, Does patient want to make changes to medical advance directive?: No - Patient declined  No Known Allergies  Chief Complaint  Patient presents with   Medical Management of Chronic Issues    4 month follow up. Patient has concerns about elevated blood pressure with dizziness. Patient also wants to discuss indigestion.   Health Maintenance    Zoster vaccine,COVID booster,colonoscopy     HPI: Patient is a 60 y.o. male for routine follow up.   Reports he feels like his bp is high but does not have readings.  Reports he gets dizzy, tired and lightheaded.   Reports he is trying to eat 3 meals a day but has not appetite so does not eat routinely Reports blood sugar yesterday was 90. This morning blood sugar was 85.  He had an egg sandwich for breakfast to take his medicine.  He is taking farxiga for diabetes and heart failure   Walked to office and felt good while walking.   Tries to stay off sodas because they are not good for him.  Constipation- ongoing, using miralax and dulcolax every other day.   Hyperlipidemia- taking lipitor and raptha   Reports worsening indigestion. Started this morning before he ate. Reports he generally does not have GERD. No nausea or vomiting.   No increase in shortness of breath, wheezing or cough.   Review of Systems:  Review of Systems  Constitutional:  Negative for chills, fever and weight loss.  HENT:  Negative  for tinnitus.   Respiratory:  Negative for cough, sputum production and shortness of breath.   Cardiovascular:  Negative for chest pain, palpitations and leg swelling.  Gastrointestinal:  Positive for heartburn. Negative for abdominal pain, constipation and diarrhea.  Genitourinary:  Negative for dysuria, frequency and urgency.  Musculoskeletal:  Negative for back pain, falls, joint pain and myalgias.  Skin: Negative.   Neurological:  Positive for dizziness. Negative for headaches.  Psychiatric/Behavioral:  Negative for depression and memory loss. The patient does not have insomnia.   Past Medical History:  Diagnosis Date   Adenoidal hypertrophy    Adenomatous colon polyps 2012   AKI (acute kidney injury) ([place]) 06/17/2019   Arthritis    Cataract    Mixed OU   COVID-19 virus detected 06/18/2019   Diabetes mellitus    Diabetic retinopathy ([place])    NPDR OU   GERD (gastroesophageal reflux disease)    Heart attack ([place])    [place]   Hx of adenomatous colonic polyps 08/18/2017   Hyperlipidemia    Hypertension    Hypertensive retinopathy    OU   Mild CAD    a. cath in 08/2017 showing mild nonobstructive CAD with scattered 20-30% stenosis.    Nonischemic cardiomyopathy ([place])    a. EF 20-25% by echo in 09/2017 with cath showing mild CAD. b.  Last echo 12/2017 EF 35-40%, grade 2 DD.   Obesity    PAF (paroxysmal atrial fibrillation) (HCC)    Sleep apnea    cpap,  pt says no longer has   Past Surgical History:  Procedure Laterality Date   COLONOSCOPY  05/13/11, 08/2017   9 adenomas   FOREARM SURGERY     MUSCLE BIOPSY     POLYPECTOMY     RIGHT/LEFT HEART CATH AND CORONARY ANGIOGRAPHY N/A 08/25/2017   Procedure: RIGHT/LEFT HEART CATH AND CORONARY ANGIOGRAPHY;  Surgeon: [name], MD;  Location: [facility];  Service: Cardiovascular;  Laterality: N/A;   RIGHT/LEFT HEART CATH AND CORONARY ANGIOGRAPHY N/A 10/01/2019   Procedure: RIGHT/LEFT HEART CATH AND  CORONARY ANGIOGRAPHY;  Surgeon: [name], MD;  Location: [facility];  Service: Cardiovascular;  Laterality: N/A;   Social History:   reports that he has never smoked. He has never used smokeless tobacco. He reports that he does not drink alcohol and does not use drugs.  Family History  Problem Relation Age of Onset   Heart disease Mother    Heart attack Mother 15   Hypertension Mother    Hyperlipidemia Mother    Diabetes Mother    Heart disease Father    Heart attack Father 55   Hypertension Father    Hyperlipidemia Father    Diabetes Father    Heart attack Sister 34   Colon cancer Neg Hx    Stomach cancer Neg Hx    Esophageal cancer Neg Hx    Rectal cancer Neg Hx    Colon polyps Neg Hx     Medications: Patient's Medications  New Prescriptions   No medications on file  Previous Medications   ALBUTEROL (VENTOLIN HFA) 108 (90 BASE) MCG/ACT INHALER    Inhale 2 puffs into the lungs every 6 (six) hours as needed for wheezing or shortness of breath.   AMITRIPTYLINE (ELAVIL) 10 MG TABLET    Take 2 tablets at bed time   ATORVASTATIN (LIPITOR) 80 MG TABLET    Take 1 tablet (80 mg total) by mouth daily.   BISACODYL (DULCOLAX) 5 MG EC TABLET    Take 2 tablets (10 mg total) by mouth every other day. Take at bedtime   CARVEDILOL (COREG) 6.25 MG TABLET    Take 1 tablet (6.25 mg total) by mouth 2 (two) times daily with a meal.   DAPAGLIFLOZIN PROPANEDIOL (FARXIGA) 10 MG TABS TABLET    Take 1 tablet (10 mg total) by mouth daily before breakfast.   DONEPEZIL (ARICEPT) 10 MG TABLET    Take 1 tablet (10 mg total) by mouth at bedtime.   ELIQUIS 5 MG TABS TABLET    TAKE 1 TABLET BY MOUTH TWICE DAILY   EZETIMIBE (ZETIA) 10 MG TABLET    Take 1 tablet (10 mg total) by mouth daily.   INSULIN DEGLUDEC (TRESIBA FLEXTOUCH) 200 UNIT/ML FLEXTOUCH PEN    Inject 52 Units into the skin daily.   INSULIN SYRINGE-NEEDLE U-100 (INSULIN SYRINGE .3CC/29GX1/2") 29G X 1/2" 0.3 ML MISC    Inject 1  Syringe 3 (three) times daily as directed. Check blood sugar three times daily. Dx: E11.9   LANCETS (ONETOUCH DELICA PLUS [id number]) MISC    USE TWICE DAILY   LIDOCAINE (LMX) 4 % CREAM    Apply 1 application topically 3 (three) times daily as needed.   NITROGLYCERIN (NITROSTAT) 0.4 MG SL TABLET    Place 1 tablet (0.4 mg total) under the tongue every 5 (five) minutes as needed for chest pain.   ONETOUCH ULTRA TEST STRIP    USE TWICE DAILY   POLYETHYLENE GLYCOL (MIRALAX /  GLYCOLAX) 17 G PACKET    Take 17 g by mouth 2 (two) times daily.   REPATHA 140 MG/ML SOSY    INJECT 1 PEN INTO THE SKIN EVERY 14 DAYS   SACUBITRIL-VALSARTAN (ENTRESTO) 24-26 MG    Take 1 tablet by mouth 2 (two) times daily.   SPIRONOLACTONE (ALDACTONE) 25 MG TABLET    Take 1 tablet (25 mg total) by mouth daily.   TRIAMCINOLONE (KENALOG) 0.1 %    Apply 1 application topically 3 (three) times daily as needed. For itching  Modified Medications   No medications on file  Discontinued Medications   No medications on file    Physical Exam:  Vitals:   01/15/21 1103  BP: 110/80  Pulse: 85  Temp: (!) 97 F (36.1 C)  TempSrc: Temporal  SpO2: 97%  Weight: 178 lb 6.4 oz (80.9 kg)  Height: 5' 6"  (1.676 m)   Body mass index is 28.79 kg/m. Wt Readings from Last 3 Encounters:  01/15/21 178 lb 6.4 oz (80.9 kg)  01/14/21 177 lb (80.3 kg)  12/29/20 177 lb (80.3 kg)    Physical Exam Constitutional:      General: He is not in acute distress.    Appearance: He is well-developed. He is not diaphoretic.  HENT:     Head: Normocephalic and atraumatic.     Right Ear: External ear normal.     Left Ear: External ear normal.     Mouth/Throat:     Pharynx: No oropharyngeal exudate.  Eyes:     Conjunctiva/sclera: Conjunctivae normal.     Pupils: Pupils are equal, round, and reactive to light.  Cardiovascular:     Rate and Rhythm: Normal rate and regular rhythm.     Heart sounds: Normal heart sounds.  Pulmonary:     Effort:  Pulmonary effort is normal.     Breath sounds: Normal breath sounds.  Abdominal:     General: Bowel sounds are normal.     Palpations: Abdomen is soft.  Musculoskeletal:        General: No tenderness.     Cervical back: Normal range of motion and neck supple.     Right lower leg: No edema.     Left lower leg: No edema.  Skin:    General: Skin is warm and dry.  Neurological:     Mental Status: He is alert and oriented to person, place, and time.  Psychiatric:        Mood and Affect: Mood normal.    Labs reviewed: Basic Metabolic Panel: Recent Labs    09/21/20 1224 09/26/20 1856 11/11/20 1230 11/24/20 1311  NA  --  136 140 138  K  --  3.8 4.8 4.1  CL  --  101 102 105  CO2  --  28 24 28   GLUCOSE  --  206* 167* 70  BUN  --  17 18 7   CREATININE  --  1.68* 1.56* 1.18  CALCIUM  --  9.2 9.4 9.1  TSH 1.92  --   --   --    Liver Function Tests: Recent Labs    01/20/20 1059 05/25/20 1202 09/14/20 0956 09/26/20 2056  AST 18 17 24 24   ALT 15 13 24 24   ALKPHOS 64  --  70 62  BILITOT 0.5 0.5 1.1 0.6  PROT 5.8* 6.4 7.3 6.7  ALBUMIN 3.2*  --  3.8 3.6   Recent Labs    09/26/20 2056  LIPASE 35   No results  for input(s): AMMONIA in the last 8760 hours. CBC: Recent Labs    01/20/20 1059 04/27/20 0948 09/14/20 0956 09/26/20 1856 11/24/20 1311  WBC 5.2   < > 8.4 5.8 5.7  NEUTROABS 2.7  --   --   --   --   HGB 13.3   < > 16.3 15.2 14.1  HCT 40.2   < > 49.4 44.7 45.1  MCV 91.8   < > 88.1 86.6 91.7  PLT 228   < > 225 229 247   < > = values in this interval not displayed.   Lipid Panel: Recent Labs    05/25/20 1202  CHOL 129  HDL 54  LDLCALC 58  TRIG 85  CHOLHDL 2.4   TSH: Recent Labs    09/21/20 1224  TSH 1.92   A1C: Lab Results  Component Value Date   HGBA1C 7.3 (A) 01/14/2021     Assessment/Plan 1. Type 2 diabetes mellitus with diabetic neuropathy, with long-term current use of insulin (HCC) -blood sugars have significantly improved. He is not  having lows but low for him. Encouraged consistent meals with good protein and complex carbohydrates to avoid hypoglycemia. To continue to limit sugars and take medication as prescribed.  -Encouraged dietary compliance, routine foot care/monitoring and to keep up with diabetic eye exams through ophthalmology   2. Essential hypertension Well controlled on current regimen.  3. PAF (paroxysmal atrial fibrillation) (HCC) Rate controlled. Continues on coreg twice daily  with eliquis for anticoaguation.   4. Chronic idiopathic constipation Continues on miralax and dulcolax. Encouraged proper fiber in diet as well as hydration.   5. Chronic systolic heart failure ([place]) Followed by heart failure clinic. Continues on farxga, entresto and spironolactone with coreg. No swelling, shortness of breath at this time. Reports dizziness with changes in positions and suspect this may be due to medications to treat CHF>   6. Gastroesophageal reflux disease without esophagitis -lifestyle modifications given to pt. Rarely has GERD.   7. MCI (mild cognitive impairment) -ongoing but stable. Has ppl to help him with appts and medications. Continues on aricept 10 mg daily. Followed by neurology.  8. Has not had routine dental care, referral placed.    Next appt: 4 months.  Carlos American. [place], [facility] [phone]

## 2021-01-15 NOTE — Patient Instructions (Signed)
To get Shingles and COVID booster at your local pharmacy

## 2021-01-19 DIAGNOSIS — Z20822 Contact with and (suspected) exposure to covid-19: Secondary | ICD-10-CM | POA: Diagnosis not present

## 2021-01-22 DIAGNOSIS — Z20822 Contact with and (suspected) exposure to covid-19: Secondary | ICD-10-CM | POA: Diagnosis not present

## 2021-01-26 DIAGNOSIS — Z20822 Contact with and (suspected) exposure to covid-19: Secondary | ICD-10-CM | POA: Diagnosis not present

## 2021-01-29 DIAGNOSIS — Z20822 Contact with and (suspected) exposure to covid-19: Secondary | ICD-10-CM | POA: Diagnosis not present

## 2021-01-31 DIAGNOSIS — Z20822 Contact with and (suspected) exposure to covid-19: Secondary | ICD-10-CM | POA: Diagnosis not present

## 2021-02-02 ENCOUNTER — Other Ambulatory Visit (HOSPITAL_COMMUNITY): Payer: Self-pay

## 2021-02-02 NOTE — Progress Notes (Addendum)
Paramedicine Encounter    Patient ID: Calvin [name], male    DOB: 1960-09-22, 60 y.o.   MRN: [id number]   [name], NP as PCP - General (Geriatric Medicine) [name], MD as PCP - Cardiology (Cardiology) [name], [name], MD as PCP - Advanced Heart Failure (Cardiology) [name], MD as Consulting Physician (Endocrinology) Jorge Ny, LCSW as Social Worker (Licensed Clinical Social Worker) Pleasant, Eppie Gibson, RN as [place] [facility]  Patient Active Problem List   Diagnosis Date Noted   Adrenal nodule ([place]) 09/21/2020   Intermittent claudication ([place]) 05/25/2020   Hypoglycemia due to insulin 10/15/2019   Chronic anticoagulation 10/15/2019   Right sided weakness 06/17/2019   Foot pain, bilateral 02/01/2019   Overgrown toenails 12/20/2018   Unsteady gait 12/20/2018   Encounter for therapeutic drug monitoring 10/22/2018   AF (paroxysmal atrial fibrillation) ([place]) 03/10/2018   Diabetic neuropathy ([place]) 09/26/2017   MCI (mild cognitive impairment) 06/23/2017   OSA on CPAP 05/10/2017   Gastroesophageal reflux disease 05/10/2017   Insomnia 05/10/2017   Onychomycosis of multiple toenails with type 2 diabetes mellitus and peripheral neuropathy ([place]) 93/79/0240   Chronic systolic heart failure ([place]) 11/22/2015   Essential hypertension 11/22/2015   DM (diabetes mellitus) ([place]) 11/20/2015   HLD (hyperlipidemia) 11/20/2015   Hx of adenomatous colonic polyps 05/19/2011    Current Outpatient Medications:    albuterol (VENTOLIN HFA) 108 (90 Base) MCG/ACT inhaler, Inhale 2 puffs into the lungs every 6 (six) hours as needed for wheezing or shortness of breath., Disp: 6.7 g, Rfl: 0   atorvastatin (LIPITOR) 80 MG tablet, Take 1 tablet (80 mg total) by mouth daily. (Patient taking differently: Take 80 mg by mouth every evening.), Disp: 90 tablet, Rfl: 3   bisacodyl (DULCOLAX) 5 MG EC tablet, Take 2 tablets (10 mg  total) by mouth every other day. Take at bedtime, Disp: 30 tablet, Rfl: 0   carvedilol (COREG) 6.25 MG tablet, Take 1 tablet (6.25 mg total) by mouth 2 (two) times daily with a meal., Disp: 180 tablet, Rfl: 3   dapagliflozin propanediol (FARXIGA) 10 MG TABS tablet, Take 1 tablet (10 mg total) by mouth daily before breakfast., Disp: 90 tablet, Rfl: 3   ELIQUIS 5 MG TABS tablet, TAKE 1 TABLET BY MOUTH TWICE DAILY, Disp: 60 tablet, Rfl: 5   ezetimibe (ZETIA) 10 MG tablet, Take 1 tablet (10 mg total) by mouth daily., Disp: 30 tablet, Rfl: 0   insulin degludec (TRESIBA FLEXTOUCH) 200 UNIT/ML FlexTouch Pen, Inject 52 Units into the skin daily., Disp: 18 mL, Rfl: 3   Insulin Syringe-Needle U-100 (INSULIN SYRINGE .3CC/29GX1/2") 29G X 1/2" 0.3 ML MISC, Inject 1 Syringe 3 (three) times daily as directed. Check blood sugar three times daily. Dx: E11.9 (Patient taking differently: Inject 1 Syringe as directed 3 (three) times daily. Check blood sugar three times daily. Dx: E11.9), Disp: 100 each, Rfl: 3   Lancets (ONETOUCH DELICA PLUS [id number]) MISC, USE TWICE DAILY, Disp: 200 each, Rfl: 3   ONETOUCH ULTRA test strip, USE TWICE DAILY, Disp: 200 strip, Rfl: 3   polyethylene glycol (MIRALAX / GLYCOLAX) 17 g packet, Take 17 g by mouth 2 (two) times daily., Disp: 14 each, Rfl: 0   REPATHA 140 MG/ML SOSY, INJECT 1 PEN INTO THE SKIN EVERY 14 DAYS, Disp: 6 mL, Rfl: 3   sacubitril-valsartan (ENTRESTO) 24-26 MG, Take 1 tablet by mouth 2 (two) times daily., Disp: 60 tablet, Rfl: 11   spironolactone (  ALDACTONE) 25 MG tablet, Take 1 tablet (25 mg total) by mouth daily., Disp: 90 tablet, Rfl: 3   donepezil (ARICEPT) 10 MG tablet, Take 1 tablet (10 mg total) by mouth at bedtime. (Patient not taking: Reported on 02/02/2021), Disp: 90 tablet, Rfl: 3   nitroGLYCERIN (NITROSTAT) 0.4 MG SL tablet, Place 1 tablet (0.4 mg total) under the tongue every 5 (five) minutes as needed for chest pain. (Patient not taking: No sig reported),  Disp: 30 tablet, Rfl: 1 No Known Allergies    Social History   Socioeconomic History   Marital status: Single    Spouse name: Not on file   Number of children: 0   Years of education: Not on file   Highest education level: Not on file  Occupational History   Occupation: Disability  Tobacco Use   Smoking status: Never   Smokeless tobacco: Never  Vaping Use   Vaping Use: Never used  Substance and Sexual Activity   Alcohol use: No   Drug use: No   Sexual activity: Yes    Birth control/protection: None  Other Topics Concern   Not on file  Social History Narrative   Social History   Diet?    Do you drink/eat things with caffeine? yes   Marital status?       single      Do you live in a house, apartment, assisted living, condo, trailer, etc.? yes   Is it one or more stories? One story   How many persons live in your home?   Do you have any pets in your home? (please list)    Highest level of education completed? graduate   Do you exercise?            no                          Type & how often?   Advanced Directives   Do you have a living will? no   Do you have a DNR form?                                  If not, do you want to discuss one? no   Do you have signed POA/HPOA for forms? no      Functional Status   Do you have difficulty bathing or dressing yourself? no   Do you have difficulty preparing food or eating? no   Do you have difficulty managing your medications? no   Do you have difficulty managing your finances? no   Do you have difficulty affording your medications? no   Social Determinants of Health   Financial Resource Strain: Not on file  Food Insecurity: No Food Insecurity   Worried About Running Out of Food in the Last Year: Never true   Ran Out of Food in the Last Year: Never true  Transportation Needs: No Transportation Needs   Lack of Transportation (Medical): No   Lack of Transportation (Non-Medical): No  Physical Activity: Not on file   Stress: Not on file  Social Connections: Not on file  Intimate Partner Violence: Not on file    Physical Exam      Future Appointments  Date Time Provider [place]  02/08/2021  1:00 PM [facility] ECHO OP 1 [facility]ECHOL[facility]  02/08/2021  2:00 PM [name], [name], MD MC-HVSC None  02/10/2021  9:30 AM Carlean Purl,  Ofilia Neas, MD L[facility]-GI [facility]  02/15/2021 11:45 AM Pleasant, Eppie Gibson, RN THN-CCC None  05/17/2021  1:00 PM [name], MD LBPC-LBENDO None  05/19/2021 10:30 AM [name], NP PSC-PSC None  07/08/2021  8:30 AM Cameron Sprang, MD LBN-LBNG None    BP 130/78   Pulse 82   Resp 18   SpO2 99%  CBG PTA-93 Weight yesterday-175 Last visit weight-177  Pt has seen his PCP and his endo doc. No med changes.  He has received the pill dose packs from [facility]. I checked those and the pill dose packs are accurate.  He is not taking the aricept-the neuro has to see him first before refilling.  He needs the repatha refilled so I called [facility] to get that ordered. $9.85 co-pay He just got off bus when I arrived.  I reminded him of the clinic appointment next Monday.  He reports his blood sugars dropped to 90s to 100-he is feeling lightheaded and dizzy sometimes.   He reports that he is having bad indigestion and at night time about 67mn later he will throw it up and he is doing that often. His PCP advised him to take tums for it.  I have advised him should it continue then he may need referral for GI.  He has clinic appoint next week post echo so I will f/u at that point. If things go well, then we can discuss d/c from paramedicine.    KMarylouise Stacks EWebsterCGulfshore Endoscopy IncParamedic  02/02/21

## 2021-02-03 ENCOUNTER — Other Ambulatory Visit: Payer: Self-pay | Admitting: Cardiology

## 2021-02-03 DIAGNOSIS — Z20822 Contact with and (suspected) exposure to covid-19: Secondary | ICD-10-CM | POA: Diagnosis not present

## 2021-02-06 DIAGNOSIS — Z20822 Contact with and (suspected) exposure to covid-19: Secondary | ICD-10-CM | POA: Diagnosis not present

## 2021-02-08 ENCOUNTER — Other Ambulatory Visit: Payer: Self-pay

## 2021-02-08 ENCOUNTER — Encounter (HOSPITAL_COMMUNITY): Payer: Self-pay | Admitting: Internal Medicine

## 2021-02-08 ENCOUNTER — Ambulatory Visit (HOSPITAL_BASED_OUTPATIENT_CLINIC_OR_DEPARTMENT_OTHER)
Admission: RE | Admit: 2021-02-08 | Discharge: 2021-02-08 | Disposition: A | Discharge status: Home / Self Care | Payer: Medicare Other | Source: Ambulatory Visit | Attending: Internal Medicine | Admitting: Internal Medicine

## 2021-02-08 ENCOUNTER — Ambulatory Visit (HOSPITAL_COMMUNITY)
Admission: RE | Admit: 2021-02-08 | Discharge: 2021-02-08 | Disposition: A | Discharge status: Home / Self Care | Payer: Medicare Other | Source: Ambulatory Visit | Attending: Nurse Practitioner | Admitting: Nurse Practitioner

## 2021-02-08 ENCOUNTER — Telehealth: Payer: Self-pay | Admitting: *Deleted

## 2021-02-08 VITALS — BP 110/64 | HR 75 | Wt 176.6 lb

## 2021-02-08 DIAGNOSIS — E039 Hypothyroidism, unspecified: Secondary | ICD-10-CM

## 2021-02-08 DIAGNOSIS — E1140 Type 2 diabetes mellitus with diabetic neuropathy, unspecified: Secondary | ICD-10-CM

## 2021-02-08 DIAGNOSIS — I5022 Chronic systolic (congestive) heart failure: Secondary | ICD-10-CM | POA: Diagnosis not present

## 2021-02-08 DIAGNOSIS — I2510 Atherosclerotic heart disease of native coronary artery without angina pectoris: Secondary | ICD-10-CM | POA: Insufficient documentation

## 2021-02-08 DIAGNOSIS — E785 Hyperlipidemia, unspecified: Secondary | ICD-10-CM | POA: Diagnosis not present

## 2021-02-08 DIAGNOSIS — Z8249 Family history of ischemic heart disease and other diseases of the circulatory system: Secondary | ICD-10-CM | POA: Insufficient documentation

## 2021-02-08 DIAGNOSIS — I5042 Chronic combined systolic (congestive) and diastolic (congestive) heart failure: Secondary | ICD-10-CM | POA: Diagnosis not present

## 2021-02-08 DIAGNOSIS — I110 Hypertensive heart disease with heart failure: Secondary | ICD-10-CM | POA: Insufficient documentation

## 2021-02-08 DIAGNOSIS — I480 Paroxysmal atrial fibrillation: Secondary | ICD-10-CM

## 2021-02-08 DIAGNOSIS — Z7901 Long term (current) use of anticoagulants: Secondary | ICD-10-CM | POA: Insufficient documentation

## 2021-02-08 DIAGNOSIS — N179 Acute kidney failure, unspecified: Secondary | ICD-10-CM | POA: Diagnosis not present

## 2021-02-08 DIAGNOSIS — Z79899 Other long term (current) drug therapy: Secondary | ICD-10-CM | POA: Diagnosis not present

## 2021-02-08 DIAGNOSIS — I428 Other cardiomyopathies: Secondary | ICD-10-CM | POA: Diagnosis not present

## 2021-02-08 DIAGNOSIS — Z794 Long term (current) use of insulin: Secondary | ICD-10-CM | POA: Diagnosis not present

## 2021-02-08 DIAGNOSIS — U071 COVID-19: Secondary | ICD-10-CM | POA: Diagnosis not present

## 2021-02-08 DIAGNOSIS — E1136 Type 2 diabetes mellitus with diabetic cataract: Secondary | ICD-10-CM | POA: Diagnosis not present

## 2021-02-08 DIAGNOSIS — R0602 Shortness of breath: Secondary | ICD-10-CM

## 2021-02-08 LAB — CBC
HCT: 46.7 % (ref 39.0–52.0)
Hemoglobin: 15.8 g/dL (ref 13.0–17.0)
MCH: 30.6 pg (ref 26.0–34.0)
MCHC: 33.8 g/dL (ref 30.0–36.0)
MCV: 90.3 fL (ref 80.0–100.0)
Platelets: 250 10*3/uL (ref 150–400)
RBC: 5.17 MIL/uL (ref 4.22–5.81)
RDW: 13 % (ref 11.5–15.5)
WBC: 4.7 10*3/uL (ref 4.0–10.5)
nRBC: 0 % (ref 0.0–0.2)

## 2021-02-08 LAB — COMPREHENSIVE METABOLIC PANEL
ALT: 15 U/L (ref 0–44)
AST: 18 U/L (ref 15–41)
Albumin: 3.6 g/dL (ref 3.5–5.0)
Alkaline Phosphatase: 53 U/L (ref 38–126)
Anion gap: 6 (ref 5–15)
BUN: 17 mg/dL (ref 6–20)
CO2: 25 mmol/L (ref 22–32)
Calcium: 9 mg/dL (ref 8.9–10.3)
Chloride: 108 mmol/L (ref 98–111)
Creatinine, Ser: 1.47 mg/dL — ABNORMAL HIGH (ref 0.61–1.24)
GFR, Estimated: 54 mL/min — ABNORMAL LOW (ref >60)
Glucose, Bld: 100 mg/dL — ABNORMAL HIGH (ref 70–99)
Potassium: 4.5 mmol/L (ref 3.5–5.1)
Sodium: 139 mmol/L (ref 135–145)
Total Bilirubin: 0.2 mg/dL — ABNORMAL LOW (ref 0.3–1.2)
Total Protein: 6.5 g/dL (ref 6.5–8.1)

## 2021-02-08 LAB — ECHOCARDIOGRAM COMPLETE
Area-P 1/2: 2.74 cm2
S' Lateral: 3.6 cm

## 2021-02-08 LAB — BRAIN NATRIURETIC PEPTIDE: B Natriuretic Peptide: 82.1 pg/mL (ref 0.0–100.0)

## 2021-02-08 LAB — HEMOGLOBIN A1C
Hgb A1c MFr Bld: 7.6 % — ABNORMAL HIGH (ref 4.8–5.6)
Mean Plasma Glucose: 171.42 mg/dL

## 2021-02-08 LAB — TSH: TSH: 2.744 u[IU]/mL (ref 0.350–4.500)

## 2021-02-08 NOTE — Progress Notes (Signed)
ADVANCED HF CLINIC NOTE  Referring Physician: Primary Care: Dr [name]  Primary Cardiologist: Dr Angelena Form  Neurology: Dr Delice Lesch  [facility]: Dr. Haroldine Laws   HPI Calvin [name] is a 60 yo male with history of chronic systolic heart failure due to NICM, uncontrolled DMII, GERD, HTN, MI 2015, memory issues, and hyperlipidemia.   Admitted 08/2017 with CP. Underwent LHC/RHC with mild nonobstructive CAD & EF 35-40%.  Readmitted in 10/2017 with chest pain. CTA was negative for PE. He was also admitted in 03/2018 with chest pain. He had a brief episode of A fib and was started on eliquis and carvedilol was increased to 6.25 mg twice a day.  Back in 2020 he was on Entresto but this was later stopped due to dizziness. Placed back on lisinopril.   Had sleep study 07/2018 that was normal.   Repeat 2D Echo 01/2019 that showed EF 35-40%, mild LVH, normal RV size and function. No significant valvular dysfunction.   06/2019 Admitted for Covid 19 infection. Was placed on BiPAP but did not require intubation.  I saw him in 3/21 for first time and was very depressed/stressed out and was having a lot of fatigue and SOB. R/L cath in 3/21 with non-obstructive CAD (LAD 28m 60d, LCX 40% diffuse, RCA 30p, 574m RHC well compensated PCWP 8 CI 4.3  Seen in ED 06/08/20 for CP for 4 days and rash. CXR without infiltrates or pulmonary edema, hs-Tn 13-->12, labs unremarkable except elevated creatinine 1.42, EKG stable. CP symptoms resolved and thought to be stress-related.   Today he returns for HF follow up. Says he has been doing ok. Was walking 2-3 miles routinely but lately it has been more difficult. However today he is very tired. No appetite. Taking all medicines. No edema, orthopnea,edema or CP. On Eliquis. No bleeding.    Echo today 02/08/21 EF 30-35% RV mild HK Personally reviewed   Studies Echo 09/27/19 EF 40-45% RV ok.  ECHO 12/2017  EF 35-40% Grade II DD. Moderate global reduction in LV systolic function;  moderate   diastolic dysfunction. ECHO 09/2017  EF 20-25% Grade IDD   R/L cath in 3/21: with non-obstructive CAD (LAD 4093m0d, LCX 40% diffuse, RCA 30p, 17m85mHC well compensated PCWP 8 CI 4.3  LHC/RHC 08/2017  RA 5  PA 22/11 (15)  PCWP 9  CO 6 CI 3/14  Prox RCA to Mid RCA lesion is 30% stenosed. Mid RCA to Dist RCA lesion is 30% stenosed. Prox Cx to Dist Cx lesion is 20% stenosed. Mid LAD lesion is 30% stenosed. There is mild to moderate left ventricular systolic dysfunction. LV end diastolic pressure is mildly elevated. The left ventricular ejection fraction is 35-45% by visual estimate. There is no mitral valve regurgitation.    SH: Disabled for 6 years due to diabetes. He does not smoke. Denies cocaine abuse.  Past Medical History:  Diagnosis Date   Adenoidal hypertrophy    Adenomatous colon polyps 2012   AKI (acute kidney injury) (HCC)[name]   Arthritis    Cataract    Mixed OU   COVID-19 virus detected 06/18/2019   Diabetes mellitus    Diabetic retinopathy (HCC)[place] NPDR OU   GERD (gastroesophageal reflux disease)    Heart attack (HCC)[place] While living in [place]   Hx of adenomatous colonic polyps 08/18/2017   Hyperlipidemia    Hypertension    Hypertensive retinopathy    OU   Mild CAD    a. cath in  08/2017 showing mild nonobstructive CAD with scattered 20-30% stenosis.    Nonischemic cardiomyopathy ([place])    a. EF 20-25% by echo in 09/2017 with cath showing mild CAD. b.  Last echo 12/2017 EF 35-40%, grade 2 DD.   Obesity    PAF (paroxysmal atrial fibrillation) (HCC)    Sleep apnea    cpap, pt says no longer has    Current Outpatient Medications  Medication Sig Dispense Refill   albuterol (VENTOLIN HFA) 108 (90 Base) MCG/ACT inhaler Inhale 2 puffs into the lungs every 6 (six) hours as needed for wheezing or shortness of breath. 6.7 g 0   atorvastatin (LIPITOR) 80 MG tablet Take 1 tablet (80 mg total) by mouth daily. (Patient taking differently: Take 80 mg by  mouth every evening.) 90 tablet 3   bisacodyl (DULCOLAX) 5 MG EC tablet Take 2 tablets (10 mg total) by mouth every other day. Take at bedtime 30 tablet 0   carvedilol (COREG) 6.25 MG tablet Take 1 tablet (6.25 mg total) by mouth 2 (two) times daily with a meal. 180 tablet 3   dapagliflozin propanediol (FARXIGA) 10 MG TABS tablet Take 1 tablet (10 mg total) by mouth daily before breakfast. 90 tablet 3   donepezil (ARICEPT) 10 MG tablet Take 1 tablet (10 mg total) by mouth at bedtime. (Patient not taking: Reported on 02/02/2021) 90 tablet 3   ELIQUIS 5 MG TABS tablet TAKE ONE TABLET BY MOUTH EVERY MORNING and TAKE ONE TABLET BY MOUTH EVERYDAY AT BEDTIME 60 tablet 3   ezetimibe (ZETIA) 10 MG tablet Take 1 tablet (10 mg total) by mouth daily. 30 tablet 0   insulin degludec (TRESIBA FLEXTOUCH) 200 UNIT/ML FlexTouch Pen Inject 52 Units into the skin daily. 18 mL 3   Insulin Syringe-Needle U-100 (INSULIN SYRINGE .3CC/29GX1/2") 29G X 1/2" 0.3 ML MISC Inject 1 Syringe 3 (three) times daily as directed. Check blood sugar three times daily. Dx: E11.9 (Patient taking differently: Inject 1 Syringe as directed 3 (three) times daily. Check blood sugar three times daily. Dx: E11.9) 100 each 3   Lancets (ONETOUCH DELICA PLUS [id number]) MISC USE TWICE DAILY 200 each 3   nitroGLYCERIN (NITROSTAT) 0.4 MG SL tablet Place 1 tablet (0.4 mg total) under the tongue every 5 (five) minutes as needed for chest pain. (Patient not taking: No sig reported) 30 tablet 1   ONETOUCH ULTRA test strip USE TWICE DAILY 200 strip 3   polyethylene glycol (MIRALAX / GLYCOLAX) 17 g packet Take 17 g by mouth 2 (two) times daily. 14 each 0   REPATHA 140 MG/ML SOSY INJECT 1 PEN INTO THE SKIN EVERY 14 DAYS 6 mL 3   sacubitril-valsartan (ENTRESTO) 24-26 MG Take 1 tablet by mouth 2 (two) times daily. 60 tablet 11   spironolactone (ALDACTONE) 25 MG tablet Take 1 tablet (25 mg total) by mouth daily. 90 tablet 3   No current facility-administered  medications for this encounter.    No Known Allergies    Social History   Socioeconomic History   Marital status: Single    Spouse name: Not on file   Number of children: 0   Years of education: Not on file   Highest education level: Not on file  Occupational History   Occupation: Disability  Tobacco Use   Smoking status: Never   Smokeless tobacco: Never  Vaping Use   Vaping Use: Never used  Substance and Sexual Activity   Alcohol use: No   Drug use: No   Sexual  activity: Yes    Birth control/protection: None  Other Topics Concern   Not on file  Social History Narrative   Social History   Diet?    Do you drink/eat things with caffeine? yes   Marital status?       single      Do you live in a house, apartment, assisted living, condo, trailer, etc.? yes   Is it one or more stories? One story   How many persons live in your home?   Do you have any pets in your home? (please list)    Highest level of education completed? graduate   Do you exercise?            no                          Type & how often?   Advanced Directives   Do you have a living will? no   Do you have a DNR form?                                  If not, do you want to discuss one? no   Do you have signed POA/HPOA for forms? no      Functional Status   Do you have difficulty bathing or dressing yourself? no   Do you have difficulty preparing food or eating? no   Do you have difficulty managing your medications? no   Do you have difficulty managing your finances? no   Do you have difficulty affording your medications? no   Social Determinants of Health   Financial Resource Strain: Not on file  Food Insecurity: No Food Insecurity   Worried About Running Out of Food in the Last Year: Never true   Ran Out of Food in the Last Year: Never true  Transportation Needs: No Transportation Needs   Lack of Transportation (Medical): No   Lack of Transportation (Non-Medical): No  Physical Activity: Not on  file  Stress: Not on file  Social Connections: Not on file  Intimate Partner Violence: Not on file      Family History  Problem Relation Age of Onset   Heart disease Mother    Heart attack Mother 28   Hypertension Mother    Hyperlipidemia Mother    Diabetes Mother    Heart disease Father    Heart attack Father 50   Hypertension Father    Hyperlipidemia Father    Diabetes Father    Heart attack Sister 31   Colon cancer Neg Hx    Stomach cancer Neg Hx    Esophageal cancer Neg Hx    Rectal cancer Neg Hx    Colon polyps Neg Hx     Vitals:   02/08/21 1403  BP: 110/64  Pulse: 75  SpO2: 98%  Weight: 80.1 kg (176 lb 9.6 oz)    Wt Readings from Last 3 Encounters:  01/15/21 80.9 kg (178 lb 6.4 oz)  01/14/21 80.3 kg (177 lb)  12/29/20 80.3 kg (177 lb)   PHYSICAL EXAM: General:  Well appearing. No resp difficulty HEENT: normal Neck: supple. no JVD. Carotids 2+ bilat; no bruits. No lymphadenopathy or thryomegaly appreciated. Cor: PMI nondisplaced. Regular rate & rhythm. No rubs, gallops or murmurs. Lungs: clear Abdomen: soft, nontender, nondistended. No hepatosplenomegaly. No bruits or masses. Good bowel sounds. Extremities: no cyanosis, clubbing, rash, edema Neuro: alert &  orientedx3, cranial nerves grossly intact. moves all 4 extremities w/o difficulty. Affect pleasant  ASSESSMENT & PLAN: 1. Acute on Chronic Systolic /Diastolic Heart Failure, NICM - ECHO 12/2017 EF 35-40%. LHC 2/219 with nonobstructive CAD.  - ECHO 01/2019, EF 35-40% - Echo 09/27/19 EF 40-45% RV ok  - R/L cath in 3/21 with non-obstructive CAD (LAD 20m 60d, LCX 40% diffuse, RCA 30p, 561m RHC well compensated PCWP 8 CI 4.3 - Echo today 02/08/21 EF 30-35% RV mild HK Personally reviewed - Much worse today. Now NYHA III. Volume ok - Will check labs today and set up CPX testing to more objectively quantify degree of HF.  - Continue Farxiga 10 mg daily.  - Continue carvedilol 6.25 mg bid. - Continue  spironolactone 25 mg daily.  - Continue Entresto 24/26 bid. Won't increase with fatigue. - Consider Bidil in future if BP allows (will try adding Entresto first) - We discussed possible ICD. We will get cMRI to more clearly assess EF.   2. HTN - BP ok   3. Type 2 diabetes mellitus - hgb A1C 8.4 2/22 - Management per Dr. ElLoanne Drilling - Continue Farxiga.  4. CAD:  - No s/s angina - mild nonobstructive CAD by cath 08/2017 & 3/21 - no ASA w/ Eliquis.  - continue ? blocker. - on statin, repatha and zetia -> lipids managed by PCP  5. PAF - Eliquis for a/c.  - Remains in NSR   DaGlori BickersMD 02/08/21 1:52 PM

## 2021-02-08 NOTE — Addendum Note (Signed)
Encounter addended by: Stanford Scotland, RN on: 02/08/2021 2:52 PM  Actions taken: Visit diagnoses modified, Order list changed, Diagnosis association updated

## 2021-02-08 NOTE — Addendum Note (Signed)
Encounter addended by: Payton Mccallum, RN on: 02/08/2021 2:58 PM  Actions taken: Clinical Note Signed

## 2021-02-08 NOTE — Patient Instructions (Signed)
Labs were performed today, the clinic will contact you if labs are abnormal.  Your physician has recommended that you have a cardiopulmonary stress test (CPX). CPX testing is a non-invasive measurement of heart and lung function. It replaces a traditional treadmill stress test. This type of test provides a tremendous amount of information that relates not only to your present condition but also for future outcomes. This test combines measurements of you ventilation, respiratory gas exchange in the lungs, electrocardiogram (EKG), blood pressure and physical response before, during, and following an exercise protocol.   Your physician has requested that you have a cardiac MRI. Cardiac MRI uses a computer to create images of your heart as its beating, producing both still and moving pictures of your heart and major blood vessels. For further information please visit [email or web address] Please follow the instruction sheet given to you today for more information. This requires insurance approval and you will be contacted to make your appointment  milAt the [facility], you and your health needs are our priority. As part of our continuing mission to provide you with exceptional heart care, we have created designated Provider Care Teams. These Care Teams include your primary Cardiologist (physician) and Advanced Practice Providers (APPs- Physician Assistants and Nurse Practitioners) who all work together to provide you with the care you need, when you need it.   You may see any of the following providers on your designated Care Team at your next follow up: Dr Glori Bickers Dr Loralie Champagne Dr Patrice Paradise, NP Lyda Jester, [place] Ginnie Smart Audry Riles, PharmD   Please be sure to bring in all your medications bottles to every appointment.    If you have any questions or concerns before your next appointment please send [place] a message through [place] or call our  office at [phone].    TO LEAVE A MESSAGE FOR THE NURSE SELECT OPTION 2, PLEASE LEAVE A MESSAGE INCLUDING: YOUR NAME DATE OF BIRTH CALL BACK NUMBER REASON FOR CALL**this is important as we prioritize the call backs  YOU WILL RECEIVE A CALL BACK THE SAME DAY AS LONG AS YOU CALL BEFORE 4:00 PM
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
## 2021-02-08 NOTE — Progress Notes (Signed)
  Echocardiogram 2D Echocardiogram has been performed.  Calvin [name] M 02/08/2021, 1:23 PM

## 2021-02-09 NOTE — Telephone Encounter (Signed)
Rx printed

## 2021-02-09 NOTE — Telephone Encounter (Signed)
LMOM for patient to return call.

## 2021-02-10 ENCOUNTER — Ambulatory Visit: Payer: Medicare Other | Admitting: Internal Medicine

## 2021-02-10 NOTE — Telephone Encounter (Signed)
Patient notified and will pick up. Left Rx up front for pick up

## 2021-02-11 ENCOUNTER — Other Ambulatory Visit: Payer: Self-pay | Admitting: Cardiovascular Disease

## 2021-02-11 ENCOUNTER — Telehealth (HOSPITAL_COMMUNITY): Payer: Self-pay | Admitting: *Deleted

## 2021-02-11 NOTE — Telephone Encounter (Signed)
CMRI no pre cert reqd. Msg sent to julia piercy to schedule.

## 2021-02-15 ENCOUNTER — Other Ambulatory Visit: Payer: Self-pay | Admitting: *Deleted

## 2021-02-16 NOTE — Patient Instructions (Signed)
Goals Addressed             This Visit's Progress    THN - Monitor and Manage My Blood Sugar   On track    Barriers:Health Behaviors Knowledge Timeframe:  Long-Range Goal Priority:  High Start Date:  2/23//                           Expected End Date:  60630160              Follow Up Date [id number]   - check blood sugar at prescribed times - take the blood sugar log to all doctor visits    Why is this important?   Checking your blood sugar at home helps to keep it from getting very high or very low.  Writing the results in a diary or log helps the doctor know how to care for you.  Your blood sugar log should have the time, date and the results.  Also, write down the amount of insulin or other medicine that you take.  Other information, like what you ate, exercise done and how you were feeling, will also be helpful.     Notes:   11/8 - Reminded to monitor multiple times a day with insulin changes. Patient checks sugars regularly- patient reports sugars tend to be high in the am.    12/9 - Reviewed insulin changes as well as appointment with nutritionist  09/24/20 Patient sugars remain in the 300 range last A1c 8.4.  Encouraged patient to really watch his diet and limit sweets and carbohydrate containing food. 12/17/20 A1c up to 8.9 encouraged better sugar control.  Patient remains active through walking.   Diabetes Management Discussed: Medication adherence Reviewed importance of limiting carbs such as rice, potatoes, breads, and pastas. Also discussed limiting sweets and sugary drinks.  Discussed importance of portion control.  Also discussed importance of annual exams such as eye exam.   [id number] A1C 7.6     THN - Set My Target A1C   On track    Timeframe:  Long-Range Goal Priority:  Medium Start Date:  27062376                           Expected End Date:      [id number]                Follow Up Date [id number]   - set target A1C - Less than 8   Why is this important?    Your target A1C is decided together by you and your doctor.  It is based on several things like your age and other health issues.    Notes:   11/8 - decreased to 9.3 on 04/22/20 [id number]  A1C 7.6   12/9 - decreased to 8.9 in December 2021  08/26/20- A1c 8.4 08-11-20  09/24/20 patient continues to work at lowering A1c.

## 2021-02-16 NOTE — Telephone Encounter (Addendum)
Patient came back to the office stating previous order misplaced and requesting another order to be printed and faxed to [place]. Order printed. He provided phone number [phone]. Called to get fax number, voicemail came on. Lovena Le took over to find fax number.

## 2021-02-16 NOTE — Patient Outreach (Signed)
[facility]) Care Management  [place]  02/16/2021   Calvin [name] February 28, 1961 [id number]  RN Health Coach telephone call to patient.  Hipaa compliance verified. Patient blood sugar was 105 non fasting. A1C is 7.6. Per patient he walks 6-7 miles a day. His appetite is poor. He is trying to eat 3 meals a day. Patient stated that his legs give out. Patient had recent fall but no injuries. Per patient he has not received an eye exam for this year. Per patient he has a balance at the ophthalmologist office.  Patient has visual difficulty seeing small print. Per patient he does not have transportation. Patient stated that he walks everywhere he goes. Patient has agreed to follow up outreach calls.   Encounter Medications:  Outpatient Encounter Medications as of 02/15/2021  Medication Sig   albuterol (VENTOLIN HFA) 108 (90 Base) MCG/ACT inhaler Inhale 2 puffs into the lungs every 6 (six) hours as needed for wheezing or shortness of breath.   atorvastatin (LIPITOR) 80 MG tablet Take 1 tablet (80 mg total) by mouth daily.   bisacodyl (DULCOLAX) 5 MG EC tablet Take 2 tablets (10 mg total) by mouth every other day. Take at bedtime   carvedilol (COREG) 6.25 MG tablet Take 1 tablet (6.25 mg total) by mouth 2 (two) times daily with a meal.   dapagliflozin propanediol (FARXIGA) 10 MG TABS tablet Take 1 tablet (10 mg total) by mouth daily before breakfast.   ELIQUIS 5 MG TABS tablet TAKE ONE TABLET BY MOUTH EVERY MORNING and TAKE ONE TABLET BY MOUTH EVERYDAY AT BEDTIME   ezetimibe (ZETIA) 10 MG tablet TAKE ONE TABLET BY MOUTH EVERY MORNING   insulin degludec (TRESIBA FLEXTOUCH) 200 UNIT/ML FlexTouch Pen Inject 52 Units into the skin daily.   Insulin Syringe-Needle U-100 (INSULIN SYRINGE .3CC/29GX1/2") 29G X 1/2" 0.3 ML MISC Inject 1 Syringe 3 (three) times daily as directed. Check blood sugar three times daily. Dx: E11.9 (Patient taking differently: Inject 1 Syringe as directed  3 (three) times daily. Check blood sugar three times daily. Dx: E11.9)   Lancets (ONETOUCH DELICA PLUS [id number]) MISC USE TWICE DAILY   nitroGLYCERIN (NITROSTAT) 0.4 MG SL tablet Place 1 tablet (0.4 mg total) under the tongue every 5 (five) minutes as needed for chest pain.   ONETOUCH ULTRA test strip USE TWICE DAILY   polyethylene glycol (MIRALAX / GLYCOLAX) 17 g packet Take 17 g by mouth 2 (two) times daily.   REPATHA 140 MG/ML SOSY INJECT 1 PEN INTO THE SKIN EVERY 14 DAYS   sacubitril-valsartan (ENTRESTO) 24-26 MG Take 1 tablet by mouth 2 (two) times daily.   spironolactone (ALDACTONE) 25 MG tablet Take 1 tablet (25 mg total) by mouth daily.   No facility-administered encounter medications on file as of 02/15/2021.    Functional Status:  In your present state of health, do you have any difficulty performing the following activities: 09/24/2020 07/23/2020  Hearing? N -  Vision? N Y  Difficulty concentrating or making decisions? Y Y  Comment some memory issues -  Walking or climbing stairs? Y Y  Comment patient reports some shortness of breath. -  Dressing or bathing? N N  Doing errands, shopping? Tempie Donning  Comment family assists does not drive often  Preparing Food and eating ? N N  Using the Toilet? N N  In the past six months, have you accidently leaked urine? N Y  Do you have problems with loss of bowel control? N N  Managing  your Medications? Y Y  Comment paramedicine sees patient for med fills -  Managing your Finances? Tempie Donning  Comment family assists -  Housekeeping or managing your Housekeeping? Tempie Donning  Comment family assists -  Some recent data might be hidden    Fall/Depression Screening: Fall Risk  02/16/2021 01/15/2021 09/24/2020  Falls in the past year? 1 0 0  Number falls in past yr: 0 0 -  Injury with Fall? 0 0 -  Risk for fall due to : Impaired balance/gait;History of fall(s);Impaired mobility;Impaired vision No Fall Risks -  Follow up Falls evaluation completed;Education  provided;Falls prevention discussed Falls evaluation completed -   PHQ 2/9 Scores 12/17/2020 09/24/2020 07/23/2020 07/01/2020 05/25/2020 07/15/2019 05/01/2019  PHQ - 2 Score 0 0 0 1 0 0 0    Assessment:   Care Plan Care Plan : Diabetes Type 2 (Adult)  Updates made by Vanita Cannell, Eppie Gibson, RN since 02/16/2021 12:00 AM     Problem: Glycemic Management (Diabetes, Type 2)   Priority: Medium     Problem: Disease Progression (Diabetes, Type 2)   Priority: Medium       Goals Addressed             This Visit's Progress    [facility] - Monitor and Manage My Blood Sugar   On track    Barriers:Health Behaviors Knowledge Timeframe:  Long-Range Goal Priority:  High Start Date:  2/23//                           Expected End Date:  [id number]              Follow Up Date [id number]   - check blood sugar at prescribed times - take the blood sugar log to all doctor visits    Why is this important?   Checking your blood sugar at home helps to keep it from getting very high or very low.  Writing the results in a diary or log helps the doctor know how to care for you.  Your blood sugar log should have the time, date and the results.  Also, write down the amount of insulin or other medicine that you take.  Other information, like what you ate, exercise done and how you were feeling, will also be helpful.     Notes:   11/8 - Reminded to monitor multiple times a day with insulin changes. Patient checks sugars regularly- patient reports sugars tend to be high in the am.    12/9 - Reviewed insulin changes as well as appointment with nutritionist  09/24/20 Patient sugars remain in the 300 range last A1c 8.4.  Encouraged patient to really watch his diet and limit sweets and carbohydrate containing food. 12/17/20 A1c up to 8.9 encouraged better sugar control.  Patient remains active through walking.   Diabetes Management Discussed: Medication adherence Reviewed importance of limiting carbs such as rice,  potatoes, breads, and pastas. Also discussed limiting sweets and sugary drinks.  Discussed importance of portion control.  Also discussed importance of annual exams such as eye exam.   [id number] A1C 7.6     [facility] - Set My Target A1C   On track    Timeframe:  Long-Range Goal Priority:  Medium Start Date:  89211941                           Expected End Date:  22773750                Follow Up Date [id number]   - set target A1C - Less than 8   Why is this important?   Your target A1C is decided together by you and your doctor.  It is based on several things like your age and other health issues.    Notes:   11/8 - decreased to 9.3 on 04/22/20 [id number]  A1C 7.6   12/9 - decreased to 8.9 in December 2021  08/26/20- A1c 8.4 08-11-20  09/24/20 patient continues to work at lowering A1c.         Plan:  Follow-up: Follow-up in 1 month(s) RN provided information on [facility] transportation RN provided information on [place] alert system RN provided information on [employer] RN provided [employer] sent Guide for journey with diabetes RN sent update assessment to PCP  [facility] [phone]

## 2021-03-01 ENCOUNTER — Other Ambulatory Visit (HOSPITAL_COMMUNITY): Payer: Self-pay | Admitting: *Deleted

## 2021-03-01 ENCOUNTER — Ambulatory Visit (HOSPITAL_COMMUNITY): Payer: Medicare Other | Attending: Internal Medicine

## 2021-03-01 ENCOUNTER — Other Ambulatory Visit: Payer: Self-pay

## 2021-03-01 DIAGNOSIS — I5022 Chronic systolic (congestive) heart failure: Secondary | ICD-10-CM | POA: Diagnosis not present

## 2021-03-02 ENCOUNTER — Encounter (INDEPENDENT_AMBULATORY_CARE_PROVIDER_SITE_OTHER): Payer: Medicare Other | Admitting: Ophthalmology

## 2021-03-02 DIAGNOSIS — H25813 Combined forms of age-related cataract, bilateral: Secondary | ICD-10-CM

## 2021-03-02 DIAGNOSIS — E113413 Type 2 diabetes mellitus with severe nonproliferative diabetic retinopathy with macular edema, bilateral: Secondary | ICD-10-CM

## 2021-03-02 DIAGNOSIS — H35033 Hypertensive retinopathy, bilateral: Secondary | ICD-10-CM

## 2021-03-02 DIAGNOSIS — I10 Essential (primary) hypertension: Secondary | ICD-10-CM

## 2021-03-02 DIAGNOSIS — H3581 Retinal edema: Secondary | ICD-10-CM

## 2021-03-03 ENCOUNTER — Encounter (HOSPITAL_COMMUNITY): Payer: Self-pay | Admitting: Adult Health

## 2021-03-03 NOTE — Progress Notes (Signed)
HF Paramedicine Team Based Care Meeting  HF MD- NA  HF NP - Fletcher NP-C   [place] [facility] admit within the last 30 days for heart failure? No   Medications concerns? On pill dose packs   Transportation issues ? Rides bus   Education needs? Yes for medications   SDOH : Has family support. Has housing.   Eligible for discharge? No   Has worsening heart failure. Needs CPX test.   Jenaya Saar NP-C  3:22 PM

## 2021-03-11 ENCOUNTER — Other Ambulatory Visit: Payer: Self-pay | Admitting: *Deleted

## 2021-03-11 NOTE — Patient Outreach (Signed)
[facility]) Care Management  [place]  03/11/2021   Calvin [name] Apr 04, 1961 [id number]  RN Health Coach telephone call to patient.  Hipaa compliance verified. Per patient he was feeling good. His A1C is 7.3 down from 8.9. Patient stated his appetite is fair.  He has not had any weight loss. Patient has followed through with eye exam and dental visit. Per patient he has not followed through with scheduling the transportation with the information RN sent last outreach. Patient has not followed through with the medical alert information he received. Patient has agreed to follow up outreach calls.    Encounter Medications:  Outpatient Encounter Medications as of 03/11/2021  Medication Sig   albuterol (VENTOLIN HFA) 108 (90 Base) MCG/ACT inhaler Inhale 2 puffs into the lungs every 6 (six) hours as needed for wheezing or shortness of breath.   atorvastatin (LIPITOR) 80 MG tablet Take 1 tablet (80 mg total) by mouth daily.   bisacodyl (DULCOLAX) 5 MG EC tablet Take 2 tablets (10 mg total) by mouth every other day. Take at bedtime   carvedilol (COREG) 6.25 MG tablet Take 1 tablet (6.25 mg total) by mouth 2 (two) times daily with a meal.   dapagliflozin propanediol (FARXIGA) 10 MG TABS tablet Take 1 tablet (10 mg total) by mouth daily before breakfast.   ELIQUIS 5 MG TABS tablet TAKE ONE TABLET BY MOUTH EVERY MORNING and TAKE ONE TABLET BY MOUTH EVERYDAY AT BEDTIME   ezetimibe (ZETIA) 10 MG tablet TAKE ONE TABLET BY MOUTH EVERY MORNING   insulin degludec (TRESIBA FLEXTOUCH) 200 UNIT/ML FlexTouch Pen Inject 52 Units into the skin daily.   Insulin Syringe-Needle U-100 (INSULIN SYRINGE .3CC/29GX1/2") 29G X 1/2" 0.3 ML MISC Inject 1 Syringe 3 (three) times daily as directed. Check blood sugar three times daily. Dx: E11.9 (Patient taking differently: Inject 1 Syringe as directed 3 (three) times daily. Check blood sugar three times daily. Dx: E11.9)   Lancets (ONETOUCH  DELICA PLUS [id number]) MISC USE TWICE DAILY   nitroGLYCERIN (NITROSTAT) 0.4 MG SL tablet Place 1 tablet (0.4 mg total) under the tongue every 5 (five) minutes as needed for chest pain.   ONETOUCH ULTRA test strip USE TWICE DAILY   polyethylene glycol (MIRALAX / GLYCOLAX) 17 g packet Take 17 g by mouth 2 (two) times daily.   REPATHA 140 MG/ML SOSY INJECT 1 PEN INTO THE SKIN EVERY 14 DAYS   sacubitril-valsartan (ENTRESTO) 24-26 MG Take 1 tablet by mouth 2 (two) times daily.   spironolactone (ALDACTONE) 25 MG tablet Take 1 tablet (25 mg total) by mouth daily.   No facility-administered encounter medications on file as of 03/11/2021.    Functional Status:  In your present state of health, do you have any difficulty performing the following activities: 09/24/2020 07/23/2020  Hearing? N -  Vision? N Y  Difficulty concentrating or making decisions? Y Y  Comment some memory issues -  Walking or climbing stairs? Y Y  Comment patient reports some shortness of breath. -  Dressing or bathing? N N  Doing errands, shopping? Tempie Donning  Comment family assists does not drive often  Preparing Food and eating ? N N  Using the Toilet? N N  In the past six months, have you accidently leaked urine? N Y  Do you have problems with loss of bowel control? N N  Managing your Medications? Y Y  Comment paramedicine sees patient for med fills -  Managing your Finances? Tempie Donning  Comment family  assists -  Housekeeping or managing your Housekeeping? Tempie Donning  Comment family assists -  Some recent data might be hidden    Fall/Depression Screening: Fall Risk  03/11/2021 02/16/2021 01/15/2021  Falls in the past year? 1 1 0  Number falls in past yr: 0 0 0  Injury with Fall? 0 0 0  Risk for fall due to : Impaired balance/gait;History of fall(s);Impaired mobility Impaired balance/gait;History of fall(s);Impaired mobility;Impaired vision No Fall Risks  Follow up Falls evaluation completed Falls evaluation completed;Education  provided;Falls prevention discussed Falls evaluation completed   PHQ 2/9 Scores 12/17/2020 09/24/2020 07/23/2020 07/01/2020 05/25/2020 07/15/2019 05/01/2019  PHQ - 2 Score 0 0 0 1 0 0 0    Assessment:   Care Plan Care Plan : Diabetes Type 2 (Adult)  Updates made by Verlin Grills, RN since 03/11/2021 12:00 AM     Problem: Glycemic Management (Diabetes, Type 2)   Priority: Medium     Long-Range Goal: Glycemic Management Optimized as evidenced by A1c less than 8.4   Start Date: 04/17/2020  Expected End Date: 07/03/2021  This Visit's Progress: On track  Recent Progress: Not on track  Priority: High  Note:   Evidence-based guidance:  Anticipate A1C testing (point-of-care) every 3 to 6 months based on goal attainment.  Review mutually-set A1C goal or target range.   Notes:  [id number] Patient A1C has decreased from 8.9 to 7.3      Goals Addressed             This Visit's Progress    [facility] - Monitor and Manage My Blood Sugar   On track    Barriers:Health Behaviors Knowledge Timeframe:  Long-Range Goal Priority:  High Start Date:  2/23//                           Expected End Date:  [id number]              Follow Up Date [id number]   - check blood sugar at prescribed times - take the blood sugar log to all doctor visits    Why is this important?   Checking your blood sugar at home helps to keep it from getting very high or very low.  Writing the results in a diary or log helps the doctor know how to care for you.  Your blood sugar log should have the time, date and the results.  Also, write down the amount of insulin or other medicine that you take.  Other information, like what you ate, exercise done and how you were feeling, will also be helpful.     Notes:   11/8 - Reminded to monitor multiple times a day with insulin changes. Patient checks sugars regularly- patient reports sugars tend to be high in the am.    12/9 - Reviewed insulin changes as well as appointment  with nutritionist  09/24/20 Patient sugars remain in the 300 range last A1c 8.4.  Encouraged patient to really watch his diet and limit sweets and carbohydrate containing food. 12/17/20 A1c up to 8.9 encouraged better sugar control.  Patient remains active through walking.   Diabetes Management Discussed: Medication adherence Reviewed importance of limiting carbs such as rice, potatoes, breads, and pastas. Also discussed limiting sweets and sugary drinks.  Discussed importance of portion control.  Also discussed importance of annual exams such as eye exam.   [id number] A1C 7.6 [id number] Patient reminded the importance of monitoring blood sugars as  per ordered     [facility] - Set My Target A1C   On track    Timeframe:  Long-Range Goal Priority:  Medium Start Date:  35573220                           Expected End Date:      25427062                Follow Up Date [id number]   - set target A1C - Less than 8   Why is this important?   Your target A1C is decided together by you and your doctor.  It is based on several things like your age and other health issues.    Notes:   11/8 - decreased to 9.3 on 04/22/20 [id number]  A1C 7.6   12/9 - decreased to 8.9 in December 2021  08/26/20- A1c 8.4 08-11-20  09/24/20 patient continues to work at lowering A1c.  [id number] Patient A1C has decreased to 7.3 from 8.9        Plan:  Follow-up: Follow-up in 1 month(s) RN sent educational information on high blood sugar RN sent educational information on low blood sugar RN sent educational material on Healthy eating plan RN sent know where to go guide RN sent update assessment to PCP  [place] [facility] [phone]

## 2021-03-11 NOTE — Patient Instructions (Signed)
Goals Addressed             This Visit's Progress    [facility] - Monitor and Manage My Blood Sugar   On track    Barriers:Health Behaviors Knowledge Timeframe:  Long-Range Goal Priority:  High Start Date:  2/23//                           Expected End Date:  [id number]              Follow Up Date [id number]   - check blood sugar at prescribed times - take the blood sugar log to all doctor visits    Why is this important?   Checking your blood sugar at home helps to keep it from getting very high or very low.  Writing the results in a diary or log helps the doctor know how to care for you.  Your blood sugar log should have the time, date and the results.  Also, write down the amount of insulin or other medicine that you take.  Other information, like what you ate, exercise done and how you were feeling, will also be helpful.     Notes:   11/8 - Reminded to monitor multiple times a day with insulin changes. Patient checks sugars regularly- patient reports sugars tend to be high in the am.    12/9 - Reviewed insulin changes as well as appointment with nutritionist  09/24/20 Patient sugars remain in the 300 range last A1c 8.4.  Encouraged patient to really watch his diet and limit sweets and carbohydrate containing food. 12/17/20 A1c up to 8.9 encouraged better sugar control.  Patient remains active through walking.   Diabetes Management Discussed: Medication adherence Reviewed importance of limiting carbs such as rice, potatoes, breads, and pastas. Also discussed limiting sweets and sugary drinks.  Discussed importance of portion control.  Also discussed importance of annual exams such as eye exam.   [id number] A1C 7.6 [id number] Patient reminded the importance of monitoring blood sugars as per ordered     [facility] - Set My Target A1C   On track    Timeframe:  Long-Range Goal Priority:  Medium Start Date:  92426834                           Expected End Date:      19622297                Follow  Up Date 98921194   - set target A1C - Less than 8   Why is this important?   Your target A1C is decided together by you and your doctor.  It is based on several things like your age and other health issues.    Notes:   11/8 - decreased to 9.3 on 04/22/20 [id number]  A1C 7.6   12/9 - decreased to 8.9 in December 2021  08/26/20- A1c 8.4 08-11-20  09/24/20 patient continues to work at lowering A1c.  [id number] Patient A1C has decreased to 7.3 from 8.9

## 2021-03-15 ENCOUNTER — Telehealth (HOSPITAL_COMMUNITY): Payer: Self-pay | Admitting: *Deleted

## 2021-03-15 ENCOUNTER — Other Ambulatory Visit: Payer: Self-pay | Admitting: Cardiovascular Disease

## 2021-03-15 NOTE — Telephone Encounter (Signed)
Reaching out to patient to offer assistance regarding upcoming cardiac imaging study; pt verbalizes understanding of appt date/time, parking situation and where to check in, and verified current allergies; name and call back number provided for further questions should they arise  Casyn Becvar RN Navigator Cardiac Imaging [facility] [phone] office [phone] cell  

## 2021-03-16 ENCOUNTER — Encounter (INDEPENDENT_AMBULATORY_CARE_PROVIDER_SITE_OTHER): Payer: Medicare Other | Admitting: Ophthalmology

## 2021-03-17 ENCOUNTER — Ambulatory Visit (HOSPITAL_COMMUNITY)
Admission: RE | Admit: 2021-03-17 | Discharge: 2021-03-17 | Disposition: A | Discharge status: Home / Self Care | Payer: Medicare Other | Source: Ambulatory Visit | Attending: Internal Medicine | Admitting: Internal Medicine

## 2021-03-17 ENCOUNTER — Other Ambulatory Visit: Payer: Self-pay

## 2021-03-17 DIAGNOSIS — I5022 Chronic systolic (congestive) heart failure: Secondary | ICD-10-CM | POA: Diagnosis not present

## 2021-03-17 MED ORDER — GADOBUTROL 1 MMOL/ML IV SOLN
11.0000 mL | Freq: Once | INTRAVENOUS | Status: AC | PRN
  Administered 2021-03-17: 11 mL via INTRAVENOUS

## 2021-03-27 LAB — HM HEPATITIS C SCREENING LAB: HM Hepatitis Screen: NEGATIVE

## 2021-03-30 NOTE — Progress Notes (Signed)
Assessment/Plan:     60 yo male with a history of Mild Cognitive Impairment. MoCA Blind is 14/22 ( equals MoCA 19/30)  on 03/31/21.  MRI/MRA brain done 10/2017 which did not show any acute changes, there was minimal chronic microvascular disease. He has not been taking donepezil 10 mg daily for the last year, as he did not refill    Mild to moderate cognitive impairment    Recommendations:  Discussed safety both in and out of the home.  Discussed the importance of regular daily schedule with inclusion of crossword puzzles to maintain brain function.  Continue to monitor mood by PCP Stay active at least 30 minutes at least 3 times a week.  Naps should be scheduled and should be no longer than 60 minutes and should not occur after 2 PM.  Mediterranean diet is recommended  Resume donepezil 10 mg daily Side effects were discussed Follow up in  6 months.   Case discussed with Dr. Delice Lesch who agrees with the plan  Subjective:     Calvin [name] is a 60 y.o. male with a history of nonischemic cardiomyopathy, paroxysmal atrial fibrillation on anticoagulation with Eliquis, hypertension, hyperlipidemia, diabetes mellitus, severe diabetic retinopathy, sleep apnea, presenting for evaluation of memory loss.  He was last seen via video visit on 08/27/2019.  This patient is here alone.  Previous records as well as any outside records available were reviewed prior to todays visit.   He is not taking any donepezil since 01/2020, as he did not refilled his prescription. Since his last visit he was seen at the ED multiple times for atypical chest pain, most recently on 11/24/2020 negative for MI.  He continues to be followed by cardiology as an outpatient. In todays visit, he feels short of breath and tired, stating that he will all his cardiologist after this appt. He reports that he is to receive a pacemaker. He states that his memory " comes and goes", "if I sit a while I will remember". He lives alone  and he states that his mood is good, without depression or anxiety. Sleeps  "terrible" 12, wakes  up "  at 12:30 and rest of the night I am awake" Not sleep-sleep, does not feel rested during the day.  Repeat sleep study was negative for sleep apnea. He reports "bad  Dreams, he dreams wakes me up". He denies sleepwalking. He does report hallucinations, seeing " things in the room, something standing and looking at me and then going away" . He sees "things in the room, something standing looking at me and then going away. I can't stand in the hallway, because the walls are closing. No other type of paranoia. Denies leaving objects in unusual places, Denies any issues with bathing and dressing. He admits to forget taking his medications. His wife manages the finances because he was forgetting to pay his bills. Appetite is poor, no recent weight loss, Denies trouble swallowing. He cooks and denies leaving the stove on. Cooks. Denies leaving the stove on or the faucet on. Weak, legs give out,  Recent fall no injuries. Uses a walker or cane. He does not drive. Denies headaches at this time, has not been needing amitriptyline, denies anosmia, double vision; he has occasional dizziness, tiredness and lightheadedness when his blood pressure  and the sugar are  not adequate.  He denies focal numbness or tingling, unilateral weakness. He has intermittent tremors. Denies urine incontinence or retention; he has a history of chronic constipation on  MiraLAX and Dulcolax.    EKG on 11/24/2020 normal sinus rhythm  QT Interval:                 366 QTC Calculation:        432  Last TSH on 02/08/2021 was 2.744, CBC normal, A1c 7.6, creatinine 1.47  History On Initial Assessment 05/09/2018: This is a pleasant 60 year old right-handed man with a history of nonischemic cardiomyopathy, paroxysmal atrial fibrillation on anticoagulation with Eliquis, hypertension, hyperlipidemia, diabetes mellitus, sleep apnea, presenting for  evaluation of memory loss. He started noticing memory changes around 1-1.5 years ago. He has started to need to write down when he takes his medications or doctors appointments, otherwise he would forget them. He lives with his goddaughter but manages bills, he states he forgets them at least once a month but tries to put them in a pile as a reminder. He stopped driving 6 years ago due to vision issues. He has noticed increased irritability over the same time period, he gets ill over little things several times a week. No hallucinations. He has poor appetite. He has a diagnosis of sleep apnea but has lost his machine several years ago. He started having heart issues in his early 54s. EF in March 2019 was 20-25%, improved to 35-40% on last echo done June 2019. He has difficulties with his glucose levels, most recent HbA1c last September 2019 was 11.9. He had an MMSE of 21/30 in December 2018 and has been taking Donepezil 72m daily without side effects.   He has had daily headaches for the past 2 years, usually with pressure over the right temporal region, lasting 2-3 hours, then coming back. No associated nausea/vomiting, photo/phonophobia. He does not take any medications for the headaches. He gets lightheaded all the time and has a history of orthostatic hypotension. Vision is blurred without his glasses. No dysarthria/dysphagia, neck/back pain, bladder dysfunction. He has occasional constipation. He has had pain over the right side of his body from the arm, down his trunk and leg for the past 2-3 years. Both feet get numb. No anosmia. He has occasional tremors. His parents had memory issues. No history of significant head injuries or alcohol use.    I personally reviewed .MRI/MRA brain done 10/2017 which did not show any acute changes, there was minimal chronic microvascular disease     PREVIOUS MEDICATIONS:   CURRENT MEDICATIONS:  Outpatient Encounter Medications as of 03/31/2021  Medication Sig    albuterol (VENTOLIN HFA) 108 (90 Base) MCG/ACT inhaler Inhale 2 puffs into the lungs every 6 (six) hours as needed for wheezing or shortness of breath.   atorvastatin (LIPITOR) 80 MG tablet Take 1 tablet (80 mg total) by mouth daily.   bisacodyl (DULCOLAX) 5 MG EC tablet Take 2 tablets (10 mg total) by mouth every other day. Take at bedtime   carvedilol (COREG) 6.25 MG tablet Take 1 tablet (6.25 mg total) by mouth 2 (two) times daily with a meal.   dapagliflozin propanediol (FARXIGA) 10 MG TABS tablet Take 1 tablet (10 mg total) by mouth daily before breakfast.   ELIQUIS 5 MG TABS tablet TAKE ONE TABLET BY MOUTH EVERY MORNING and TAKE ONE TABLET BY MOUTH EVERYDAY AT BEDTIME   ezetimibe (ZETIA) 10 MG tablet TAKE ONE TABLET BY MOUTH EVERY MORNING   insulin degludec (TRESIBA FLEXTOUCH) 200 UNIT/ML FlexTouch Pen Inject 52 Units into the skin daily.   Insulin Syringe-Needle U-100 (INSULIN SYRINGE .3CC/29GX1/2") 29G X 1/2" 0.3 ML MISC  Inject 1 Syringe 3 (three) times daily as directed. Check blood sugar three times daily. Dx: E11.9 (Patient taking differently: Inject 1 Syringe as directed 3 (three) times daily. Check blood sugar three times daily. Dx: E11.9)   Lancets (ONETOUCH DELICA PLUS [id number]) MISC USE TWICE DAILY   nitroGLYCERIN (NITROSTAT) 0.4 MG SL tablet Place 1 tablet (0.4 mg total) under the tongue every 5 (five) minutes as needed for chest pain.   ONETOUCH ULTRA test strip USE TWICE DAILY   polyethylene glycol (MIRALAX / GLYCOLAX) 17 g packet Take 17 g by mouth 2 (two) times daily.   REPATHA 140 MG/ML SOSY INJECT 1 PEN INTO THE SKIN EVERY 14 DAYS   sacubitril-valsartan (ENTRESTO) 24-26 MG Take 1 tablet by mouth 2 (two) times daily.   spironolactone (ALDACTONE) 25 MG tablet Take 1 tablet (25 mg total) by mouth daily.   No facility-administered encounter medications on file as of 03/31/2021.     Objective:     PHYSICAL EXAMINATION:    VITALS:   Vitals:   03/31/21 1247  BP: 133/81   Pulse: 92  SpO2: 100%  Weight: 175 lb 12.8 oz (79.7 kg)  Height: 5' 6"  (1.676 m)    GEN:  The patient appears stated age and is in NAD. HEENT:  Normocephalic, atraumatic.   Neurological examination:  General: NAD, well-groomed, appears stated age. Orientation: The patient is alert. Oriented to person, place and date     Cranial nerves: There is good facial symmetry.The speech is fluent and clear. No aphasia or dysarthria. Fund of knowledge is appropriate. Recent  memory is impaired but   remote memory is intact. Attention and concentration are reduced.  Able to name objects and repeat phrases.  Hearing is intact to conversational tone.    Sensation: Sensation reduced with pin on LUE, decreased cold RUE, decreased pin and cold RLE, decreased vibration to knees B Motor: Strength is at least antigravity x4. Tremors:minimal resting tremors DTR's 1/4 in UE/LE     Montreal Cognitive Assessment  03/31/2021  Attention: Read list of digits (0/2) 1  Attention: Read list of letters (0/1) 1  Attention: Serial 7 subtraction starting at 100 (0/3) 3  Language: Repeat phrase (0/2) 1  Language : Fluency (0/1) 0  Abstraction (0/2) 1  Delayed Recall (0/5) 2  Orientation (0/6) 5   MMSE - Mini Mental State Exam 05/09/2018 06/20/2017  Orientation to time 5 5  Orientation to Place 5 5  Registration 3 3  Attention/ Calculation 4 0  Recall 2 1  Language- name 2 objects 2 2  Language- repeat 1 1  Language- follow 3 step command 3 2  Language- read & follow direction 1 1  Write a sentence 1 0  Copy design 1 1  Total score 28 21    No flowsheet data found.     Movement examination: Tone: There is normal tone in the UE/LE Abnormal movements:  minimal resting tremor.  No myoclonus.  No asterixis.   Coordination:  There is no decremation with RAM's. Normal finger to nose  Gait and Station: The patient has no difficulty arising out of a deep-seated chair without the use of the hands. The patient's  stride length is good.  Gait is cautious and narrow, favors right leg     Total time spent on today's visit was 35  minutes, including both face-to-face time and nonface-to-face time. Time included that spent on review of records (prior notes available to me/labs/imaging if pertinent), discussing treatment  and goals, answering patient's questions and coordinating care.  Cc:  [name], NP Sharene Butters, [name]

## 2021-03-31 ENCOUNTER — Encounter: Payer: Self-pay | Admitting: Physician Assistant

## 2021-03-31 ENCOUNTER — Ambulatory Visit (INDEPENDENT_AMBULATORY_CARE_PROVIDER_SITE_OTHER): Payer: Medicare Other | Admitting: Physician Assistant

## 2021-03-31 ENCOUNTER — Other Ambulatory Visit: Payer: Self-pay

## 2021-03-31 VITALS — BP 133/81 | HR 92 | Ht 66.0 in | Wt 175.8 lb

## 2021-03-31 DIAGNOSIS — R251 Tremor, unspecified: Secondary | ICD-10-CM | POA: Diagnosis not present

## 2021-03-31 DIAGNOSIS — Z1152 Encounter for screening for COVID-19: Secondary | ICD-10-CM | POA: Diagnosis not present

## 2021-03-31 DIAGNOSIS — G3184 Mild cognitive impairment, so stated: Secondary | ICD-10-CM

## 2021-03-31 MED ORDER — DONEPEZIL HCL 10 MG PO TABS: 10.0000 mg | ORAL_TABLET | Freq: Every day | ORAL | 11 refills | Status: DC

## 2021-03-31 NOTE — Progress Notes (Addendum)
ADVANCED HF CLINIC NOTE  Referring Physician: Primary Care: Dr [name]  Primary Cardiologist: Dr Angelena Form  Neurology: Dr Delice Lesch  [facility]: Dr. Haroldine Laws   HPI Mr Senn is a 60 yo male with history of chronic systolic heart failure due to NICM, uncontrolled DMII, GERD, HTN, MI 2015, memory issues, and hyperlipidemia.   Admitted 08/2017 with CP. Underwent LHC/RHC with mild nonobstructive CAD & EF 35-40%.  Readmitted in 10/2017 with chest pain. CTA was negative for PE. He was also admitted in 03/2018 with chest pain. He had a brief episode of A fib and was started on eliquis and carvedilol was increased to 6.25 mg twice a day.  Back in 2020 he was on Entresto but this was later stopped due to dizziness. Placed back on lisinopril.   Had sleep study 07/2018 that was normal.   Repeat 2D Echo 01/2019 that showed EF 35-40%, mild LVH, normal RV size and function. No significant valvular dysfunction.   06/2019 Admitted for Covid 19 infection. Was placed on BiPAP but did not require intubation.  Seen in HF clinic 3/21 for first time and was very depressed/stressed out and was having a lot of fatigue and SOB. R/L cath in 3/21 with non-obstructive CAD (LAD 2m 60d, LCX 40% diffuse, RCA 30p, 543m RHC well compensated PCWP 8 CI 4.3  Seen in ED 06/08/20 for CP for 4 days and rash. CXR without infiltrates or pulmonary edema, hs-Tn 13-->12, labs unremarkable except elevated creatinine 1.42, EKG stable. CP symptoms resolved and thought to be stress-related.   Last seen for f/u 08/22. Doing worse with decline in activity tolerance, fatigue and less appetite. Volume appeared okay.   Echo  02/08/21 EF 30-35% RV mild HK Personally reviewed  CPX testing 08/22 with severe HF limitation.  BP rest: 110/64 BP peak: 114/60  Peak VO2: 13.3 (52% predicted peak VO2)  VE/VCO2 slope:  57  OUES: 0.95  Peak RER: 1.07  Ventilatory Threshold: 10.3 (40% predicted or measured peak VO2)  Peak RR 80  Peak Ventilation:  65.3   VE/MVV:  60%  PETCO2 at peak:  26  O2pulse:  10   (75% predicted O2pulse)   cMRI 09/14 with LVEF 45%, no LGE  Patient here today for HF F/u. Feeling poorly. Notes worsening dyspnea with exertion. Previously walked 2-3 miles a day. Walked a mile to today's appointment but had to stop and take a break after each block to catch his breath. Activity level also somewhat limited by shooting pains from his right shoulder and low back down the right leg. No orthopnea or PND. No leg edema. Has chronic dizziness but no presyncope or syncope.    Studies Echo 09/27/19 EF 40-45% RV ok.  ECHO 12/2017  EF 35-40% Grade II DD. Moderate global reduction in LV systolic function; moderate   diastolic dysfunction. ECHO 09/2017  EF 20-25% Grade IDD   R/L cath in 3/21: with non-obstructive CAD (LAD 4076m0d, LCX 40% diffuse, RCA 30p, 79m68mHC well compensated PCWP 8 CI 4.3  LHC/RHC 08/2017  RA 5  PA 22/11 (15)  PCWP 9  CO 6 CI 3/14  Prox RCA to Mid RCA lesion is 30% stenosed. Mid RCA to Dist RCA lesion is 30% stenosed. Prox Cx to Dist Cx lesion is 20% stenosed. Mid LAD lesion is 30% stenosed. There is mild to moderate left ventricular systolic dysfunction. LV end diastolic pressure is mildly elevated. The left ventricular ejection fraction is 35-45% by visual estimate. There is no mitral valve  regurgitation.    SH: Disabled for 6 years due to diabetes. He does not smoke. Denies cocaine abuse.  Past Medical History:  Diagnosis Date   Adenoidal hypertrophy    Adenomatous colon polyps 2012   AKI (acute kidney injury) ([place]) 06/17/2019   Arthritis    Cataract    Mixed OU   COVID-19 virus detected 06/18/2019   Diabetes mellitus    Diabetic retinopathy ([place])    NPDR OU   GERD (gastroesophageal reflux disease)    Heart attack ([place])    While living in [place]   Hx of adenomatous colonic polyps 08/18/2017   Hyperlipidemia    Hypertension    Hypertensive retinopathy    OU   Mild CAD    a. cath in  08/2017 showing mild nonobstructive CAD with scattered 20-30% stenosis.    Nonischemic cardiomyopathy ([place])    a. EF 20-25% by echo in 09/2017 with cath showing mild CAD. b.  Last echo 12/2017 EF 35-40%, grade 2 DD.   Obesity    PAF (paroxysmal atrial fibrillation) ([facility])    Sleep apnea    cpap, pt says no longer has    Current Outpatient Medications  Medication Sig Dispense Refill   albuterol (VENTOLIN HFA) 108 (90 Base) MCG/ACT inhaler Inhale 2 puffs into the lungs every 6 (six) hours as needed for wheezing or shortness of breath. 6.7 g 0   atorvastatin (LIPITOR) 80 MG tablet Take 1 tablet (80 mg total) by mouth daily. 90 tablet 3   bisacodyl (DULCOLAX) 5 MG EC tablet Take 2 tablets (10 mg total) by mouth every other day. Take at bedtime 30 tablet 0   carvedilol (COREG) 6.25 MG tablet Take 1 tablet (6.25 mg total) by mouth 2 (two) times daily with a meal. 180 tablet 3   dapagliflozin propanediol (FARXIGA) 10 MG TABS tablet Take 1 tablet (10 mg total) by mouth daily before breakfast. 90 tablet 3   donepezil (ARICEPT) 10 MG tablet Take 1 tablet (10 mg total) by mouth at bedtime. 30 tablet 11   ELIQUIS 5 MG TABS tablet TAKE ONE TABLET BY MOUTH EVERY MORNING and TAKE ONE TABLET BY MOUTH EVERYDAY AT BEDTIME 60 tablet 3   ezetimibe (ZETIA) 10 MG tablet TAKE ONE TABLET BY MOUTH EVERY MORNING 30 tablet 0   insulin degludec (TRESIBA FLEXTOUCH) 200 UNIT/ML FlexTouch Pen Inject 52 Units into the skin daily. 18 mL 3   Insulin Syringe-Needle U-100 (INSULIN SYRINGE .3CC/29GX1/2") 29G X 1/2" 0.3 ML MISC Inject 1 Syringe 3 (three) times daily as directed. Check blood sugar three times daily. Dx: E11.9 (Patient taking differently: Inject 1 Syringe as directed 3 (three) times daily. Check blood sugar three times daily. Dx: E11.9) 100 each 3   Lancets (ONETOUCH DELICA PLUS [id number]) MISC USE TWICE DAILY 200 each 3   nitroGLYCERIN (NITROSTAT) 0.4 MG SL tablet Place 1 tablet (0.4 mg total) under the tongue every 5  (five) minutes as needed for chest pain. 30 tablet 1   ONETOUCH ULTRA test strip USE TWICE DAILY 200 strip 3   polyethylene glycol (MIRALAX / GLYCOLAX) 17 g packet Take 17 g by mouth 2 (two) times daily. 14 each 0   REPATHA 140 MG/ML SOSY INJECT 1 PEN INTO THE SKIN EVERY 14 DAYS 6 mL 3   sacubitril-valsartan (ENTRESTO) 24-26 MG Take 1 tablet by mouth 2 (two) times daily. 60 tablet 11   spironolactone (ALDACTONE) 25 MG tablet Take 1 tablet (25 mg total) by mouth daily. 90 tablet  3   No current facility-administered medications for this encounter.    No Known Allergies    Social History   Socioeconomic History   Marital status: Single    Spouse name: Not on file   Number of children: 0   Years of education: Not on file   Highest education level: Not on file  Occupational History   Occupation: Disability  Tobacco Use   Smoking status: Never   Smokeless tobacco: Never  Vaping Use   Vaping Use: Never used  Substance and Sexual Activity   Alcohol use: No   Drug use: No   Sexual activity: Yes    Birth control/protection: None  Other Topics Concern   Not on file  Social History Narrative   Social History   Diet?    Do you drink/eat things with caffeine? yes   Marital status?       single      Do you live in a house, apartment, assisted living, condo, trailer, etc.? yes   Is it one or more stories? One story   How many persons live in your home?   Do you have any pets in your home? (please list)    Highest level of education completed? graduate   Do you exercise?            no                          Type & how often?   Advanced Directives   Do you have a living will? no   Do you have a DNR form?                                  If not, do you want to discuss one? no   Do you have signed POA/HPOA for forms? no      Functional Status   Do you have difficulty bathing or dressing yourself? no   Do you have difficulty preparing food or eating? no   Do you have difficulty  managing your medications? no   Do you have difficulty managing your finances? no   Do you have difficulty affording your medications? no   Social Determinants of Health   Financial Resource Strain: Not on file  Food Insecurity: No Food Insecurity   Worried About Running Out of Food in the Last Year: Never true   Ran Out of Food in the Last Year: Never true  Transportation Needs: No Transportation Needs   Lack of Transportation (Medical): No   Lack of Transportation (Non-Medical): No  Physical Activity: Not on file  Stress: Not on file  Social Connections: Not on file  Intimate Partner Violence: Not on file      Family History  Problem Relation Age of Onset   Heart disease Mother    Heart attack Mother 65   Hypertension Mother    Hyperlipidemia Mother    Diabetes Mother    Heart disease Father    Heart attack Father 65   Hypertension Father    Hyperlipidemia Father    Diabetes Father    Heart attack Sister 70   Colon cancer Neg Hx    Stomach cancer Neg Hx    Esophageal cancer Neg Hx    Rectal cancer Neg Hx    Colon polyps Neg Hx     Vitals:   04/01/21 0936  BP: 100/70  Pulse: 89  SpO2: 100%  Weight: 79.9 kg (176 lb 3.2 oz)     Wt Readings from Last 3 Encounters:  04/01/21 79.9 kg (176 lb 3.2 oz)  03/31/21 79.7 kg (175 lb 12.8 oz)  02/08/21 80.1 kg (176 lb 9.6 oz)   ECG: NSR 87 bpm  ReDS 22% PHYSICAL EXAM: General:  Well appearing AAM. No resp difficulty HEENT: normal Neck: supple. no JVD. Carotids 2+ bilat; no bruits. No lymphadenopathy or thryomegaly appreciated. Cor: PMI nondisplaced. Regular rate & rhythm. No rubs, gallops or murmurs. Lungs: clear Abdomen: soft, nontender, nondistended. No hepatosplenomegaly. No bruits or masses. Good bowel sounds. Extremities: no cyanosis, clubbing, rash, edema Neuro: alert & orientedx3, cranial nerves grossly intact. moves all 4 extremities w/o difficulty. Affect pleasant  ASSESSMENT & PLAN: 1. Chronic Systolic  /Diastolic Heart Failure, NICM - ECHO 12/2017 EF 35-40%. LHC 2/219 with nonobstructive CAD.  - ECHO 01/2019, EF 35-40% - Echo 09/27/19 EF 40-45% RV ok  - R/L cath in 3/21 with non-obstructive CAD (LAD 16m 60d, LCX 40% diffuse, RCA 30p, 585m RHC well compensated PCWP 8 CI 4.3 - Echo today 02/08/21 EF 30-35% RV mild HK Personally reviewed - CPX 08/22 severe HF limitation with blunted BP response, elevated VESE/GB15lope - cMRI 09/22 LVEF 45%, no LGE - NYHA IIIa. Worsening dyspnea with exertion. Volume low. ReDS 22% - Concerned about symptoms and severe HF limitation noted on recent CPX testing. LVEF better than expected on cMRI. Discussed with Dr. BeHaroldine Laws [facility] to better evaluate if need to consider advanced therapies.  - Continue Farxiga 10 mg daily.  - Continue spironolactone 25 mg daily.  - Continue Entresto 24/26 bid.  - Consider Bidil in future if BP allows  - Decrease coreg to 3.125 mg BID d/t concern for potentially low output - Add digoxin 0.125 mg daily  2. HTN - BP ok   3. Type 2 diabetes mellitus - hgb A1C 8.4 2/22 - Management per Dr. ElLoanne Drilling - Continue Farxiga.  4. CAD:  - mild nonobstructive CAD by cath 08/2017 & 3/21 - no ASA w/ Eliquis.  - continue ? blocker. - on statin, repatha and zetia -> lipids managed by PCP - LHC next week as above (see #1)  5. PAF - Eliquis for a/c.  - Remains in NSR  F/u: As scheduled with Dr. BeHaroldine Lawsn 11/09   FIUnity Medical And Surgical HospitalLILynder ParentsMD 04/01/21 9:53 AM  Patient seen and examined with the above-signed Advanced Practice Provider and/or Housestaff. I personally reviewed laboratory data, imaging studies and relevant notes. I independently examined the patient and formulated the important aspects of the plan. I have edited the note to reflect any of my changes or salient points. I have personally discussed the plan with the patient and/or family.  Recent cMRI, echo and CPX test reviewed. He has Class NYHA III symptoms  with EF 45% on cMRI but CPX suggests much worse limitation   General:  Sitting in chair. No resp difficulty HEENT: normal Neck: supple. no JVD. Carotids 2+ bilat; no bruits. No lymphadenopathy or thryomegaly appreciated. Cor: PMI nondisplaced. Regular rate & rhythm. No rubs, gallops or murmurs. Lungs: clear Abdomen: soft, nontender, nondistended. No hepatosplenomegaly. No bruits or masses. Good bowel sounds. Extremities: no cyanosis, clubbing, rash, edema Neuro: alert & orientedx3, cranial nerves grossly intact. moves all 4 extremities w/o difficulty. Affect pleasant  Symptoms and CPX test very concerning but do not seem to correlate well with cMRI.  Will plan R/L cath to further evaluate.   Total time spent 35 minutes. Over half that time spent discussing above.   Glori Bickers, MD  9:16 AM

## 2021-03-31 NOTE — H&P (View-Only) (Signed)
ADVANCED HF CLINIC NOTE  Referring Physician: Primary Care: Dr [name]  Primary Cardiologist: Dr Angelena Form  Neurology: Dr Delice Lesch  [facility]: Dr. Haroldine Laws   HPI Mr Rafalski is a 60 yo male with history of chronic systolic heart failure due to NICM, uncontrolled DMII, GERD, HTN, MI 2015, memory issues, and hyperlipidemia.   Admitted 08/2017 with CP. Underwent LHC/RHC with mild nonobstructive CAD & EF 35-40%.  Readmitted in 10/2017 with chest pain. CTA was negative for PE. He was also admitted in 03/2018 with chest pain. He had a brief episode of A fib and was started on eliquis and carvedilol was increased to 6.25 mg twice a day.  Back in 2020 he was on Entresto but this was later stopped due to dizziness. Placed back on lisinopril.   Had sleep study 07/2018 that was normal.   Repeat 2D Echo 01/2019 that showed EF 35-40%, mild LVH, normal RV size and function. No significant valvular dysfunction.   06/2019 Admitted for Covid 19 infection. Was placed on BiPAP but did not require intubation.  Seen in HF clinic 3/21 for first time and was very depressed/stressed out and was having a lot of fatigue and SOB. R/L cath in 3/21 with non-obstructive CAD (LAD 66m 60d, LCX 40% diffuse, RCA 30p, 598m RHC well compensated PCWP 8 CI 4.3  Seen in ED 06/08/20 for CP for 4 days and rash. CXR without infiltrates or pulmonary edema, hs-Tn 13-->12, labs unremarkable except elevated creatinine 1.42, EKG stable. CP symptoms resolved and thought to be stress-related.   Last seen for f/u 08/22. Doing worse with decline in activity tolerance, fatigue and less appetite. Volume appeared okay.   Echo  02/08/21 EF 30-35% RV mild HK Personally reviewed  CPX testing 08/22 with severe HF limitation.  BP rest: 110/64 BP peak: 114/60  Peak VO2: 13.3 (52% predicted peak VO2)  VE/VCO2 slope:  57  OUES: 0.95  Peak RER: 1.07  Ventilatory Threshold: 10.3 (40% predicted or measured peak VO2)  Peak RR 80  Peak Ventilation:  65.3   VE/MVV:  60%  PETCO2 at peak:  26  O2pulse:  10   (75% predicted O2pulse)   cMRI 09/14 with LVEF 45%, no LGE  Patient here today for HF F/u. Feeling poorly. Notes worsening dyspnea with exertion. Previously walked 2-3 miles a day. Walked a mile to today's appointment but had to stop and take a break after each block to catch his breath. Activity level also somewhat limited by shooting pains from his right shoulder and low back down the right leg. No orthopnea or PND. No leg edema. Has chronic dizziness but no presyncope or syncope.    Studies Echo 09/27/19 EF 40-45% RV ok.  ECHO 12/2017  EF 35-40% Grade II DD. Moderate global reduction in LV systolic function; moderate   diastolic dysfunction. ECHO 09/2017  EF 20-25% Grade IDD   R/L cath in 3/21: with non-obstructive CAD (LAD 4088m0d, LCX 40% diffuse, RCA 30p, 49m24mHC well compensated PCWP 8 CI 4.3  LHC/RHC 08/2017  RA 5  PA 22/11 (15)  PCWP 9  CO 6 CI 3/14  Prox RCA to Mid RCA lesion is 30% stenosed. Mid RCA to Dist RCA lesion is 30% stenosed. Prox Cx to Dist Cx lesion is 20% stenosed. Mid LAD lesion is 30% stenosed. There is mild to moderate left ventricular systolic dysfunction. LV end diastolic pressure is mildly elevated. The left ventricular ejection fraction is 35-45% by visual estimate. There is no mitral valve  regurgitation.    SH: Disabled for 6 years due to diabetes. He does not smoke. Denies cocaine abuse.  Past Medical History:  Diagnosis Date   Adenoidal hypertrophy    Adenomatous colon polyps 2012   AKI (acute kidney injury) ([place]) 06/17/2019   Arthritis    Cataract    Mixed OU   COVID-19 virus detected 06/18/2019   Diabetes mellitus    Diabetic retinopathy ([place])    NPDR OU   GERD (gastroesophageal reflux disease)    Heart attack ([place])    While living in [place]   Hx of adenomatous colonic polyps 08/18/2017   Hyperlipidemia    Hypertension    Hypertensive retinopathy    OU   Mild CAD    a. cath in  08/2017 showing mild nonobstructive CAD with scattered 20-30% stenosis.    Nonischemic cardiomyopathy ([place])    a. EF 20-25% by echo in 09/2017 with cath showing mild CAD. b.  Last echo 12/2017 EF 35-40%, grade 2 DD.   Obesity    PAF (paroxysmal atrial fibrillation) (H[facility])    Sleep apnea    cpap, pt says no longer has    Current Outpatient Medications  Medication Sig Dispense Refill   albuterol (VENTOLIN HFA) 108 (90 Base) MCG/ACT inhaler Inhale 2 puffs into the lungs every 6 (six) hours as needed for wheezing or shortness of breath. 6.7 g 0   atorvastatin (LIPITOR) 80 MG tablet Take 1 tablet (80 mg total) by mouth daily. 90 tablet 3   bisacodyl (DULCOLAX) 5 MG EC tablet Take 2 tablets (10 mg total) by mouth every other day. Take at bedtime 30 tablet 0   carvedilol (COREG) 6.25 MG tablet Take 1 tablet (6.25 mg total) by mouth 2 (two) times daily with a meal. 180 tablet 3   dapagliflozin propanediol (FARXIGA) 10 MG TABS tablet Take 1 tablet (10 mg total) by mouth daily before breakfast. 90 tablet 3   donepezil (ARICEPT) 10 MG tablet Take 1 tablet (10 mg total) by mouth at bedtime. 30 tablet 11   ELIQUIS 5 MG TABS tablet TAKE ONE TABLET BY MOUTH EVERY MORNING and TAKE ONE TABLET BY MOUTH EVERYDAY AT BEDTIME 60 tablet 3   ezetimibe (ZETIA) 10 MG tablet TAKE ONE TABLET BY MOUTH EVERY MORNING 30 tablet 0   insulin degludec (TRESIBA FLEXTOUCH) 200 UNIT/ML FlexTouch Pen Inject 52 Units into the skin daily. 18 mL 3   Insulin Syringe-Needle U-100 (INSULIN SYRINGE .3CC/29GX1/2") 29G X 1/2" 0.3 ML MISC Inject 1 Syringe 3 (three) times daily as directed. Check blood sugar three times daily. Dx: E11.9 (Patient taking differently: Inject 1 Syringe as directed 3 (three) times daily. Check blood sugar three times daily. Dx: E11.9) 100 each 3   Lancets (ONETOUCH DELICA PLUS EUMPNT61W) MISC USE TWICE DAILY 200 each 3   nitroGLYCERIN (NITROSTAT) 0.4 MG SL tablet Place 1 tablet (0.4 mg total) under the tongue every 5  (five) minutes as needed for chest pain. 30 tablet 1   ONETOUCH ULTRA test strip USE TWICE DAILY 200 strip 3   polyethylene glycol (MIRALAX / GLYCOLAX) 17 g packet Take 17 g by mouth 2 (two) times daily. 14 each 0   REPATHA 140 MG/ML SOSY INJECT 1 PEN INTO THE SKIN EVERY 14 DAYS 6 mL 3   sacubitril-valsartan (ENTRESTO) 24-26 MG Take 1 tablet by mouth 2 (two) times daily. 60 tablet 11   spironolactone (ALDACTONE) 25 MG tablet Take 1 tablet (25 mg total) by mouth daily. 90 tablet  3   No current facility-administered medications for this encounter.    No Known Allergies    Social History   Socioeconomic History   Marital status: Single    Spouse name: Not on file   Number of children: 0   Years of education: Not on file   Highest education level: Not on file  Occupational History   Occupation: Disability  Tobacco Use   Smoking status: Never   Smokeless tobacco: Never  Vaping Use   Vaping Use: Never used  Substance and Sexual Activity   Alcohol use: No   Drug use: No   Sexual activity: Yes    Birth control/protection: None  Other Topics Concern   Not on file  Social History Narrative   Social History   Diet?    Do you drink/eat things with caffeine? yes   Marital status?       single      Do you live in a house, apartment, assisted living, condo, trailer, etc.? yes   Is it one or more stories? One story   How many persons live in your home?   Do you have any pets in your home? (please list)    Highest level of education completed? graduate   Do you exercise?            no                          Type & how often?   Advanced Directives   Do you have a living will? no   Do you have a DNR form?                                  If not, do you want to discuss one? no   Do you have signed POA/HPOA for forms? no      Functional Status   Do you have difficulty bathing or dressing yourself? no   Do you have difficulty preparing food or eating? no   Do you have difficulty  managing your medications? no   Do you have difficulty managing your finances? no   Do you have difficulty affording your medications? no   Social Determinants of Health   Financial Resource Strain: Not on file  Food Insecurity: No Food Insecurity   Worried About Running Out of Food in the Last Year: Never true   Ran Out of Food in the Last Year: Never true  Transportation Needs: No Transportation Needs   Lack of Transportation (Medical): No   Lack of Transportation (Non-Medical): No  Physical Activity: Not on file  Stress: Not on file  Social Connections: Not on file  Intimate Partner Violence: Not on file      Family History  Problem Relation Age of Onset   Heart disease Mother    Heart attack Mother 67   Hypertension Mother    Hyperlipidemia Mother    Diabetes Mother    Heart disease Father    Heart attack Father 78   Hypertension Father    Hyperlipidemia Father    Diabetes Father    Heart attack Sister 80   Colon cancer Neg Hx    Stomach cancer Neg Hx    Esophageal cancer Neg Hx    Rectal cancer Neg Hx    Colon polyps Neg Hx     Vitals:   04/01/21 0936  BP: 100/70  Pulse: 89  SpO2: 100%  Weight: 79.9 kg (176 lb 3.2 oz)     Wt Readings from Last 3 Encounters:  04/01/21 79.9 kg (176 lb 3.2 oz)  03/31/21 79.7 kg (175 lb 12.8 oz)  02/08/21 80.1 kg (176 lb 9.6 oz)   ECG: NSR 87 bpm  ReDS 22% PHYSICAL EXAM: General:  Well appearing AAM. No resp difficulty HEENT: normal Neck: supple. no JVD. Carotids 2+ bilat; no bruits. No lymphadenopathy or thryomegaly appreciated. Cor: PMI nondisplaced. Regular rate & rhythm. No rubs, gallops or murmurs. Lungs: clear Abdomen: soft, nontender, nondistended. No hepatosplenomegaly. No bruits or masses. Good bowel sounds. Extremities: no cyanosis, clubbing, rash, edema Neuro: alert & orientedx3, cranial nerves grossly intact. moves all 4 extremities w/o difficulty. Affect pleasant  ASSESSMENT & PLAN: 1. Chronic Systolic  /Diastolic Heart Failure, NICM - ECHO 12/2017 EF 35-40%. LHC 2/219 with nonobstructive CAD.  - ECHO 01/2019, EF 35-40% - Echo 09/27/19 EF 40-45% RV ok  - R/L cath in 3/21 with non-obstructive CAD (LAD 29m 60d, LCX 40% diffuse, RCA 30p, 560m RHC well compensated PCWP 8 CI 4.3 - Echo today 02/08/21 EF 30-35% RV mild HK Personally reviewed - CPX 08/22 severe HF limitation with blunted BP response, elevated VEWH/QP59lope - cMRI 09/22 LVEF 45%, no LGE - NYHA IIIa. Worsening dyspnea with exertion. Volume low. ReDS 22% - Concerned about symptoms and severe HF limitation noted on recent CPX testing. LVEF better than expected on cMRI. Discussed with Dr. BeHaroldine Laws [facility] better evaluate if need to consider advanced therapies.  - Continue Farxiga 10 mg daily.  - Continue spironolactone 25 mg daily.  - Continue Entresto 24/26 bid.  - Consider Bidil in future if BP allows  - Decrease coreg to 3.125 mg BID d/t concern for potentially low output - Add digoxin 0.125 mg daily  2. HTN - BP ok   3. Type 2 diabetes mellitus - hgb A1C 8.4 2/22 - Management per Dr. ElLoanne Drilling - Continue Farxiga.  4. CAD:  - mild nonobstructive CAD by cath 08/2017 & 3/21 - no ASA w/ Eliquis.  - continue ? blocker. - on statin, repatha and zetia -> lipids managed by PCP - LHC next week as above (see #1)  5. PAF - Eliquis for a/c.  - Remains in NSR  F/u: As scheduled with Dr. BeHaroldine Lawsn 11/09   FIBed[facility] Ambulatory Surgical Center LLCLILynder ParentsMD 04/01/21 9:53 AM  Patient seen and examined with the above-signed Advanced Practice Provider and/or Housestaff. I personally reviewed laboratory data, imaging studies and relevant notes. I independently examined the patient and formulated the important aspects of the plan. I have edited the note to reflect any of my changes or salient points. I have personally discussed the plan with the patient and/or family.  Recent cMRI, echo and CPX test reviewed. He has Class NYHA III symptoms  with EF 45% on cMRI but CPX suggests much worse limitation   General:  Sitting in chair. No resp difficulty HEENT: normal Neck: supple. no JVD. Carotids 2+ bilat; no bruits. No lymphadenopathy or thryomegaly appreciated. Cor: PMI nondisplaced. Regular rate & rhythm. No rubs, gallops or murmurs. Lungs: clear Abdomen: soft, nontender, nondistended. No hepatosplenomegaly. No bruits or masses. Good bowel sounds. Extremities: no cyanosis, clubbing, rash, edema Neuro: alert & orientedx3, cranial nerves grossly intact. moves all 4 extremities w/o difficulty. Affect pleasant  Symptoms and CPX test very concerning but do not seem to correlate well with cMRI.  Will plan R/L cath to further evaluate.   Total time spent 35 minutes. Over half that time spent discussing above.   Glori Bickers, MD  9:16 AM

## 2021-03-31 NOTE — Patient Instructions (Addendum)
It was a pleasure to see you today at our office.   Recommendations:  Neurocognitive exam Resume donepezil 10 mg daily. Side effects were discussed  Follow up after the results are back   RECOMMENDATIONS FOR ALL PATIENTS WITH MEMORY PROBLEMS: 1. Continue to exercise (Recommend 30 minutes of walking everyday, or 3 hours every week) 2. Increase social interactions - continue going to [place] and enjoy social gatherings with friends and family 3. Eat healthy, avoid fried foods and eat more fruits and vegetables 4. Maintain adequate blood pressure, blood sugar, and blood cholesterol level. Reducing the risk of stroke and cardiovascular disease also helps promoting better memory. 5. Avoid stressful situations. Live a simple life and avoid aggravations. Organize your time and prepare for the next day in anticipation. 6. Sleep well, avoid any interruptions of sleep and avoid any distractions in the bedroom that may interfere with adequate sleep quality 7. Avoid sugar, avoid sweets as there is a strong link between excessive sugar intake, diabetes, and cognitive impairment We discussed the Mediterranean diet, which has been shown to help patients reduce the risk of progressive memory disorders and reduces cardiovascular risk. This includes eating fish, eat fruits and green leafy vegetables, nuts like almonds and hazelnuts, walnuts, and also use olive oil. Avoid fast foods and fried foods as much as possible. Avoid sweets and sugar as sugar use has been linked to worsening of memory function.  There is always a concern of gradual progression of memory problems. If this is the case, then we may need to adjust level of care according to patient needs. Support, both to the patient and caregiver, should then be put into place.    FALL PRECAUTIONS: Be cautious when walking. Scan the area for obstacles that may increase the risk of trips and falls. When getting up in the mornings, sit up at the edge of the bed  for a few minutes before getting out of bed. Consider elevating the bed at the head end to avoid drop of blood pressure when getting up. Walk always in a well-lit room (use night lights in the walls). Avoid area rugs or power cords from appliances in the middle of the walkways. Use a walker or a cane if necessary and consider physical therapy for balance exercise. Get your eyesight checked regularly.  FINANCIAL OVERSIGHT: Supervision, especially oversight when making financial decisions or transactions is also recommended.  HOME SAFETY: Consider the safety of the kitchen when operating appliances like stoves, microwave oven, and blender. Consider having supervision and share cooking responsibilities until no longer able to participate in those. Accidents with firearms and other hazards in the house should be identified and addressed as well.   ABILITY TO BE LEFT ALONE: If patient is unable to contact 911 operator, consider using LifeLine, or when the need is there, arrange for someone to stay with patients. Smoking is a fire hazard, consider supervision or cessation. Risk of wandering should be assessed by caregiver and if detected at any point, supervision and safe proof recommendations should be instituted.  MEDICATION SUPERVISION: Inability to self-administer medication needs to be constantly addressed. Implement a mechanism to ensure safe administration of the medications.   DRIVING: Regarding driving, in patients with progressive memory problems, driving will be impaired. We advise to have someone else do the driving if trouble finding directions or if minor accidents are reported. Independent driving assessment is available to determine safety of driving.   If you are interested in the driving assessment, you can  contact the following:  [employer] in [place]  [place]  [phone] or [phone]    [place] refers to food and lifestyle choices that are based on the traditions of countries located on the [employer]. This way of eating has been shown to help prevent certain conditions and improve outcomes for people who have chronic diseases, like kidney disease and heart disease. What are tips for following this plan? Lifestyle  Cook and eat meals together with your family, when possible. Drink enough fluid to keep your urine clear or pale yellow. Be physically active every day. This includes: Aerobic exercise like running or swimming. Leisure activities like gardening, walking, or housework. Get 7-8 hours of sleep each night. If recommended by your health care provider, drink red wine in moderation. This means 1 glass a day for nonpregnant women and 2 glasses a day for men. A glass of wine equals 5 oz (150 mL). Reading food labels  Check the serving size of packaged foods. For foods such as rice and pasta, the serving size refers to the amount of cooked product, not dry. Check the total fat in packaged foods. Avoid foods that have saturated fat or trans fats. Check the ingredients list for added sugars, such as corn syrup. Shopping  At the grocery store, buy most of your food from the areas near the walls of the store. This includes: Fresh fruits and vegetables (produce). Grains, beans, nuts, and seeds. Some of these may be available in unpackaged forms or large amounts (in bulk). Fresh seafood. Poultry and eggs. Low-fat dairy products. Buy whole ingredients instead of prepackaged foods. Buy fresh fruits and vegetables in-season from local farmers markets. Buy frozen fruits and vegetables in resealable bags. If you do not have access to quality fresh seafood, buy precooked frozen shrimp or canned fish, such as tuna, salmon, or sardines. Buy small amounts of raw or cooked vegetables, salads, or olives from the  deli or salad bar at your store. Stock your pantry so you always have certain foods on hand, such as olive oil, canned tuna, canned tomatoes, rice, pasta, and beans. Cooking  Cook foods with extra-virgin olive oil instead of using butter or other vegetable oils. Have meat as a side dish, and have vegetables or grains as your main dish. This means having meat in small portions or adding small amounts of meat to foods like pasta or stew. Use beans or vegetables instead of meat in common dishes like chili or lasagna. Experiment with different cooking methods. Try roasting or broiling vegetables instead of steaming or sauteing them. Add frozen vegetables to soups, stews, pasta, or rice. Add nuts or seeds for added healthy fat at each meal. You can add these to yogurt, salads, or vegetable dishes. Marinate fish or vegetables using olive oil, lemon juice, garlic, and fresh herbs. Meal planning  Plan to eat 1 vegetarian meal one day each week. Try to work up to 2 vegetarian meals, if possible. Eat seafood 2 or more times a week. Have healthy snacks readily available, such as: Vegetable sticks with hummus. Greek yogurt. Fruit and nut trail mix. Eat balanced meals throughout the week. This includes: Fruit: 2-3 servings a day Vegetables: 4-5 servings a day Low-fat dairy: 2 servings a day Fish, poultry, or lean meat: 1 serving a day Beans and legumes: 2 or more servings a week Nuts and seeds: 1-2 servings a day  Whole grains: 6-8 servings a day Extra-virgin olive oil: 3-4 servings a day Limit red meat and sweets to only a few servings a month What are my food choices? Mediterranean diet Recommended Grains: Whole-grain pasta. Brown rice. Bulgar wheat. Polenta. Couscous. Whole-wheat bread. [place]. Vegetables: Artichokes. Beets. Broccoli. Cabbage. Carrots. Eggplant. Green beans. Chard. Kale. Spinach. Onions. Leeks. Peas. Squash. Tomatoes. Peppers. Radishes. Fruits: Apples. Apricots.  Avocado. Berries. Bananas. Cherries. Dates. Figs. Grapes. Lemons. Melon. Oranges. Peaches. Plums. Pomegranate. Meats and other protein foods: Beans. Almonds. Sunflower seeds. Pine nuts. Peanuts. [place]. Salmon. Scallops. Shrimp. [place]. Tilapia. Clams. Oysters. Eggs. Dairy: Low-fat milk. Cheese. Greek yogurt. Beverages: Water. Red wine. Herbal tea. Fats and oils: Extra virgin olive oil. Avocado oil. Grape seed oil. Sweets and desserts: [place] yogurt with honey. Baked apples. Poached pears. Trail mix. Seasoning and other foods: Basil. Cilantro. Coriander. Cumin. Mint. Parsley. Sage. Rosemary. Tarragon. Garlic. Oregano. Thyme. Pepper. Balsalmic vinegar. Tahini. Hummus. Tomato sauce. Olives. Mushrooms. Limit these Grains: Prepackaged pasta or rice dishes. Prepackaged cereal with added sugar. Vegetables: Deep fried potatoes (french fries). Fruits: Fruit canned in syrup. Meats and other protein foods: Beef. Pork. Lamb. Poultry with skin. Hot dogs. [place] Salines. Dairy: Ice cream. Sour cream. Whole milk. Beverages: Juice. Sugar-sweetened soft drinks. Beer. Liquor and spirits. Fats and oils: Butter. Canola oil. Vegetable oil. Beef fat (tallow). Lard. Sweets and desserts: Cookies. Cakes. Pies. Candy. Seasoning and other foods: Mayonnaise. Premade sauces and marinades. The items listed may not be a complete list. Talk with your dietitian about what dietary choices are right for you. Summary The Mediterranean diet includes both food and lifestyle choices. Eat a variety of fresh fruits and vegetables, beans, nuts, seeds, and whole grains. Limit the amount of red meat and sweets that you eat. Talk with your health care provider about whether it is safe for you to drink red wine in moderation. This means 1 glass a day for nonpregnant women and 2 glasses a day for men. A glass of wine equals 5 oz (150 mL). This information is not intended to replace advice given to you by your health care provider. Make sure you discuss  any questions you have with your health care provider. Document Released: 02/11/2016 Document Revised: 03/15/2016 Document Reviewed: 02/11/2016 Elsevier Interactive Patient Education  2017 [employer].

## 2021-04-01 ENCOUNTER — Encounter: Payer: Self-pay | Admitting: Nurse Practitioner

## 2021-04-01 ENCOUNTER — Encounter (HOSPITAL_COMMUNITY): Payer: Self-pay

## 2021-04-01 ENCOUNTER — Ambulatory Visit (HOSPITAL_COMMUNITY)
Admission: RE | Admit: 2021-04-01 | Discharge: 2021-04-01 | Disposition: A | Discharge status: Home / Self Care | Payer: Medicare Other | Source: Ambulatory Visit | Attending: Physician Assistant | Admitting: Physician Assistant

## 2021-04-01 ENCOUNTER — Ambulatory Visit (INDEPENDENT_AMBULATORY_CARE_PROVIDER_SITE_OTHER): Payer: Medicare Other | Admitting: Nurse Practitioner

## 2021-04-01 VITALS — BP 100/70 | HR 89 | Wt 176.2 lb

## 2021-04-01 VITALS — BP 122/80 | HR 84 | Temp 96.0°F | Ht 66.0 in | Wt 176.0 lb

## 2021-04-01 DIAGNOSIS — Z7901 Long term (current) use of anticoagulants: Secondary | ICD-10-CM | POA: Insufficient documentation

## 2021-04-01 DIAGNOSIS — I480 Paroxysmal atrial fibrillation: Secondary | ICD-10-CM | POA: Diagnosis not present

## 2021-04-01 DIAGNOSIS — K219 Gastro-esophageal reflux disease without esophagitis: Secondary | ICD-10-CM | POA: Insufficient documentation

## 2021-04-01 DIAGNOSIS — Z8249 Family history of ischemic heart disease and other diseases of the circulatory system: Secondary | ICD-10-CM | POA: Diagnosis not present

## 2021-04-01 DIAGNOSIS — I110 Hypertensive heart disease with heart failure: Secondary | ICD-10-CM | POA: Diagnosis not present

## 2021-04-01 DIAGNOSIS — E1140 Type 2 diabetes mellitus with diabetic neuropathy, unspecified: Secondary | ICD-10-CM | POA: Diagnosis not present

## 2021-04-01 DIAGNOSIS — E1165 Type 2 diabetes mellitus with hyperglycemia: Secondary | ICD-10-CM | POA: Insufficient documentation

## 2021-04-01 DIAGNOSIS — I5042 Chronic combined systolic (congestive) and diastolic (congestive) heart failure: Secondary | ICD-10-CM | POA: Diagnosis not present

## 2021-04-01 DIAGNOSIS — G3184 Mild cognitive impairment, so stated: Secondary | ICD-10-CM

## 2021-04-01 DIAGNOSIS — I252 Old myocardial infarction: Secondary | ICD-10-CM | POA: Diagnosis not present

## 2021-04-01 DIAGNOSIS — E785 Hyperlipidemia, unspecified: Secondary | ICD-10-CM | POA: Insufficient documentation

## 2021-04-01 DIAGNOSIS — I10 Essential (primary) hypertension: Secondary | ICD-10-CM

## 2021-04-01 DIAGNOSIS — Z794 Long term (current) use of insulin: Secondary | ICD-10-CM

## 2021-04-01 DIAGNOSIS — Z8616 Personal history of COVID-19: Secondary | ICD-10-CM | POA: Diagnosis not present

## 2021-04-01 DIAGNOSIS — Z79899 Other long term (current) drug therapy: Secondary | ICD-10-CM | POA: Diagnosis not present

## 2021-04-01 DIAGNOSIS — M5441 Lumbago with sciatica, right side: Secondary | ICD-10-CM

## 2021-04-01 DIAGNOSIS — I5022 Chronic systolic (congestive) heart failure: Secondary | ICD-10-CM

## 2021-04-01 DIAGNOSIS — R413 Other amnesia: Secondary | ICD-10-CM | POA: Diagnosis not present

## 2021-04-01 DIAGNOSIS — R0609 Other forms of dyspnea: Secondary | ICD-10-CM | POA: Insufficient documentation

## 2021-04-01 DIAGNOSIS — Q845 Enlarged and hypertrophic nails: Secondary | ICD-10-CM

## 2021-04-01 DIAGNOSIS — I2510 Atherosclerotic heart disease of native coronary artery without angina pectoris: Secondary | ICD-10-CM | POA: Insufficient documentation

## 2021-04-01 DIAGNOSIS — R151 Fecal smearing: Secondary | ICD-10-CM | POA: Diagnosis not present

## 2021-04-01 DIAGNOSIS — E113293 Type 2 diabetes mellitus with mild nonproliferative diabetic retinopathy without macular edema, bilateral: Secondary | ICD-10-CM | POA: Diagnosis not present

## 2021-04-01 LAB — COMPREHENSIVE METABOLIC PANEL
ALT: 16 U/L (ref 0–44)
AST: 18 U/L (ref 15–41)
Albumin: 3.5 g/dL (ref 3.5–5.0)
Alkaline Phosphatase: 69 U/L (ref 38–126)
Anion gap: 10 (ref 5–15)
BUN: 14 mg/dL (ref 6–20)
CO2: 23 mmol/L (ref 22–32)
Calcium: 9.1 mg/dL (ref 8.9–10.3)
Chloride: 106 mmol/L (ref 98–111)
Creatinine, Ser: 1.34 mg/dL — ABNORMAL HIGH (ref 0.61–1.24)
GFR, Estimated: >60 mL/min (ref >60)
Glucose, Bld: 127 mg/dL — ABNORMAL HIGH (ref 70–99)
Potassium: 4.2 mmol/L (ref 3.5–5.1)
Sodium: 139 mmol/L (ref 135–145)
Total Bilirubin: 0.8 mg/dL (ref 0.3–1.2)
Total Protein: 6.6 g/dL (ref 6.5–8.1)

## 2021-04-01 LAB — CBC
HCT: 46.5 % (ref 39.0–52.0)
Hemoglobin: 15.2 g/dL (ref 13.0–17.0)
MCH: 29.7 pg (ref 26.0–34.0)
MCHC: 32.7 g/dL (ref 30.0–36.0)
MCV: 91 fL (ref 80.0–100.0)
Platelets: 264 10*3/uL (ref 150–400)
RBC: 5.11 MIL/uL (ref 4.22–5.81)
RDW: 12.6 % (ref 11.5–15.5)
WBC: 6.2 10*3/uL (ref 4.0–10.5)
nRBC: 0 % (ref 0.0–0.2)

## 2021-04-01 MED ORDER — DIGOXIN 125 MCG PO TABS: 0.1250 mg | ORAL_TABLET | Freq: Every day | ORAL | 3 refills | Status: DC

## 2021-04-01 MED ORDER — GABAPENTIN 100 MG PO CAPS: 100.0000 mg | ORAL_CAPSULE | Freq: Three times a day (TID) | ORAL | 0 refills | Status: DC

## 2021-04-01 MED ORDER — PANTOPRAZOLE SODIUM 40 MG PO TBEC: 40.0000 mg | DELAYED_RELEASE_TABLET | Freq: Every day | ORAL | 3 refills | Status: DC

## 2021-04-01 MED ORDER — CARVEDILOL 6.25 MG PO TABS: 3.1250 mg | ORAL_TABLET | Freq: Two times a day (BID) | ORAL | 4 refills | Status: DC

## 2021-04-01 NOTE — Patient Instructions (Addendum)
EKG was performed today  Labs were done today, if any labs are abnormal the clinic will call you  START Digoxin 0.125 mcg 1 tablet daily  DECREASE Coreg to 3.125 mg 1/2 tablet daily   You are scheduled for Cardiac Catheterization on October 5th, 2022  with Dr. Haroldine Laws  Please arrive at the [facility] at 8:00 a.m.  on the day of your procedure.  1. DIET ___ Nothing to eat or drink after midnight except your medications with a sip of water.  3. MAKE SURE YOU TAKE YOUR ASPIRIN.  4. ___ DO NOT TAKE these medications before your procedure: Spirolactone, Insulin     ___ YOU MAY TAKE ALL of your remaining medications with a small amount of water.   5. Plan for one night stay - bring personal belongings (i.e. toothpaste, toothbrush, etc.)  6. Bring a current list of your medications and current insurance cards.  7. Must have a responsible person to drive you home.  8. Someone must be with you for the first 24 hours after you arrive home.  9. Please wear clothes that are easy to get on and off and wear slip-on shoes.  * Special note: Every effort is made to have your procedure done on time. Occasionally there are emergencies that present themselves at the hospital that may cause delays. Please be patient if a delay does occur.  If you have any questions after you get home, please call the office at the number listed above.    At the [facility], you and your health needs are our priority. As part of our continuing mission to provide you with exceptional heart care, we have created designated Provider Care Teams. These Care Teams include your primary Cardiologist (physician) and Advanced Practice Providers (APPs- Physician Assistants and Nurse Practitioners) who all work together to provide you with the care you need, when you need it.   You may see any of the following providers on your designated Care Team at your next follow up: Dr  Glori Bickers Dr Loralie Champagne Dr Patrice Paradise, NP Lyda Jester, [place] Ginnie Smart Audry Riles, PharmD   Please be sure to bring in all your medications bottles to every appointment.    If you have any questions or concerns before your next appointment please send [place] a message through [place] or call our office at [phone].    TO LEAVE A MESSAGE FOR THE NURSE SELECT OPTION 2, PLEASE LEAVE A MESSAGE INCLUDING: YOUR NAME DATE OF BIRTH CALL BACK NUMBER REASON FOR CALL**this is important as we prioritize the call backs  YOU WILL RECEIVE A CALL BACK THE SAME DAY AS LONG AS YOU CALL BEFORE 4:00 PM

## 2021-04-01 NOTE — Patient Instructions (Addendum)
To use heating pad 2-3 times a day To use Aspercreme with lidocaine (otc) rub AFTER heat  Add metamucil or benefiber daily to help bulk up stools. Important to add more fiber to diet.   To use gabapentin 100 mg mouth up to three daily as needed for pain

## 2021-04-01 NOTE — Progress Notes (Signed)
Careteam: [name], NP as PCP - General (Geriatric Medicine) [name], MD as PCP - Cardiology (Cardiology) [name], [name], MD as PCP - Advanced Heart Failure (Cardiology) [name], MD as Consulting Physician (Endocrinology) Jorge Ny, LCSW as Social Worker (Licensed Clinical Social Worker) [place], Eppie Gibson, RN as [place], Nancy Marus (Neurology)  PLACE OF SERVICE:  [place] Directive information Does Patient Have a Medical Advance Directive?: No  No Known Allergies  Chief Complaint  Patient presents with   Acute Visit    Patient having issues with passing gas and having a bowel movement when he just passes gas.   Health Maintenance    Zoster vaccine,COVID booster, colonoscopy, Flu vaccine     HPI: Patient is a 60 y.o. male for routine follow up  He has followed with cardiologist and plan for heart cath scheduled for next week. Reports he may also get a pacer. EF was 45% continues to have shortness of breath. No swelling in legs but having sharp stabbing pains in legs.   Reports pain starts in shoulder and goes down leg. Has low back pain.which started 2 weeks ago.  No loss of control of bowels or bladder. Having increase in pain to leg. Having to use walker now.  9/10 pain. Has not taken anything for pain.  Uses warm bath. Reports leg throbbing now.  Using a back brace but that has not helped.   He saw neurologist and was placed back on aricept- he had stopped in the past  but unsure why.  Plans to restart.   Has bad GERD- every other night. Feels like his food is coming back up. Sits up 2 hours after he eats.   Review of Systems:  Review of Systems  Constitutional:  Negative for chills, fever and weight loss.  HENT:  Negative for tinnitus.   Respiratory:  Negative for cough, sputum production and shortness of breath.   Cardiovascular:  Negative for  chest pain, palpitations and leg swelling.  Gastrointestinal:  Positive for heartburn. Negative for abdominal pain, constipation and diarrhea.  Genitourinary:  Negative for dysuria, frequency and urgency.  Musculoskeletal:  Positive for back pain, joint pain and myalgias. Negative for falls.  Skin: Negative.   Neurological:  Negative for dizziness and headaches.  Psychiatric/Behavioral:  Negative for depression and memory loss. The patient does not have insomnia.    Past Medical History:  Diagnosis Date   Adenoidal hypertrophy    Adenomatous colon polyps 2012   AKI (acute kidney injury) ([place]) 06/17/2019   Arthritis    Cataract    Mixed OU   COVID-19 virus detected 06/18/2019   Diabetes mellitus    Diabetic retinopathy ([place])    NPDR OU   GERD (gastroesophageal reflux disease)    Heart attack ([place])    While living in [place]   Hx of adenomatous colonic polyps 08/18/2017   Hyperlipidemia    Hypertension    Hypertensive retinopathy    OU   Mild CAD    a. cath in 08/2017 showing mild nonobstructive CAD with scattered 20-30% stenosis.    Nonischemic cardiomyopathy ([place])    a. EF 20-25% by echo in 09/2017 with cath showing mild CAD. b.  Last echo 12/2017 EF 35-40%, grade 2 DD.   Obesity    PAF (paroxysmal atrial fibrillation) (HCC)    Sleep apnea    cpap, pt says no longer has  Past Surgical History:  Procedure Laterality Date   COLONOSCOPY  05/13/11, 08/2017   9 adenomas   FOREARM SURGERY     MUSCLE BIOPSY     POLYPECTOMY     RIGHT/LEFT HEART CATH AND CORONARY ANGIOGRAPHY N/A 08/25/2017   Procedure: RIGHT/LEFT HEART CATH AND CORONARY ANGIOGRAPHY;  Surgeon: [name], MD;  Location: [facility];  Service: Cardiovascular;  Laterality: N/A;   RIGHT/LEFT HEART CATH AND CORONARY ANGIOGRAPHY N/A 10/01/2019   Procedure: RIGHT/LEFT HEART CATH AND CORONARY ANGIOGRAPHY;  Surgeon: [name], MD;  Location: [facility];  Service: Cardiovascular;  Laterality:  N/A;   Social History:   reports that he has never smoked. He has never used smokeless tobacco. He reports that he does not drink alcohol and does not use drugs.  Family History  Problem Relation Age of Onset   Heart disease Mother    Heart attack Mother 81   Hypertension Mother    Hyperlipidemia Mother    Diabetes Mother    Heart disease Father    Heart attack Father 72   Hypertension Father    Hyperlipidemia Father    Diabetes Father    Heart attack Sister 110   Colon cancer Neg Hx    Stomach cancer Neg Hx    Esophageal cancer Neg Hx    Rectal cancer Neg Hx    Colon polyps Neg Hx     Medications: Patient's Medications  New Prescriptions   No medications on file  Previous Medications   ALBUTEROL (VENTOLIN HFA) 108 (90 BASE) MCG/ACT INHALER    Inhale 2 puffs into the lungs every 6 (six) hours as needed for wheezing or shortness of breath.   ATORVASTATIN (LIPITOR) 80 MG TABLET    Take 1 tablet (80 mg total) by mouth daily.   BISACODYL (DULCOLAX) 5 MG EC TABLET    Take 2 tablets (10 mg total) by mouth every other day. Take at bedtime   CARVEDILOL (COREG) 6.25 MG TABLET    Take 0.5 tablets (3.125 mg total) by mouth 2 (two) times daily with a meal.   DAPAGLIFLOZIN PROPANEDIOL (FARXIGA) 10 MG TABS TABLET    Take 1 tablet (10 mg total) by mouth daily before breakfast.   DIGOXIN (LANOXIN) 0.125 MG TABLET    Take 1 tablet (0.125 mg total) by mouth daily.   DONEPEZIL (ARICEPT) 10 MG TABLET    Take 1 tablet (10 mg total) by mouth at bedtime.   ELIQUIS 5 MG TABS TABLET    TAKE ONE TABLET BY MOUTH EVERY MORNING and TAKE ONE TABLET BY MOUTH EVERYDAY AT BEDTIME   EZETIMIBE (ZETIA) 10 MG TABLET    TAKE ONE TABLET BY MOUTH EVERY MORNING   INSULIN DEGLUDEC (TRESIBA FLEXTOUCH) 200 UNIT/ML FLEXTOUCH PEN    Inject 52 Units into the skin daily.   INSULIN SYRINGE-NEEDLE U-100 (INSULIN SYRINGE .3CC/29GX1/2") 29G X 1/2" 0.3 ML MISC    Inject 1 Syringe 3 (three) times daily as directed. Check blood  sugar three times daily. Dx: E11.9   LANCETS (ONETOUCH DELICA PLUS LEXNTZ00F) MISC    USE TWICE DAILY   NITROGLYCERIN (NITROSTAT) 0.4 MG SL TABLET    Place 1 tablet (0.4 mg total) under the tongue every 5 (five) minutes as needed for chest pain.   ONETOUCH ULTRA TEST STRIP    USE TWICE DAILY   POLYETHYLENE GLYCOL (MIRALAX / GLYCOLAX) 17 G PACKET    Take 17 g by mouth 2 (two) times daily.  REPATHA 140 MG/ML SOSY    INJECT 1 PEN INTO THE SKIN EVERY 14 DAYS   SACUBITRIL-VALSARTAN (ENTRESTO) 24-26 MG    Take 1 tablet by mouth 2 (two) times daily.   SPIRONOLACTONE (ALDACTONE) 25 MG TABLET    Take 1 tablet (25 mg total) by mouth daily.  Modified Medications   No medications on file  Discontinued Medications   No medications on file    Physical Exam:  Vitals:   04/01/21 1110  BP: 122/80  Pulse: 84  Temp: (!) 96 F (35.6 C)  TempSrc: Temporal  SpO2: 97%  Weight: 176 lb (79.8 kg)  Height: 5' 6"  (1.676 m)   Body mass index is 28.41 kg/m. Wt Readings from Last 3 Encounters:  04/01/21 176 lb (79.8 kg)  04/01/21 176 lb 3.2 oz (79.9 kg)  03/31/21 175 lb 12.8 oz (79.7 kg)    Physical Exam Constitutional:      General: He is not in acute distress.    Appearance: He is well-developed. He is not diaphoretic.  HENT:     Head: Normocephalic and atraumatic.     Right Ear: External ear normal.     Left Ear: External ear normal.     Mouth/Throat:     Pharynx: No oropharyngeal exudate.  Eyes:     Conjunctiva/sclera: Conjunctivae normal.     Pupils: Pupils are equal, round, and reactive to light.  Cardiovascular:     Rate and Rhythm: Normal rate and regular rhythm.     Heart sounds: Normal heart sounds.  Pulmonary:     Effort: Pulmonary effort is normal.     Breath sounds: Normal breath sounds.  Abdominal:     General: Bowel sounds are normal.     Palpations: Abdomen is soft.  Musculoskeletal:        General: No tenderness.     Cervical back: Normal range of motion and neck supple.      Right lower leg: No edema.     Left lower leg: No edema.       Legs:     Comments: Tenderness noted bilaterally lower back, behind left knee and calf but without swelling, warmth or redness noted.  Equal strength bilaterally but with cautious gait and uses walker.   Feet:     Right foot:     Skin integrity: Skin integrity normal.     Toenail Condition: Right toenails are abnormally thick and long.     Left foot:     Skin integrity: Skin integrity normal.     Toenail Condition: Left toenails are abnormally thick and long.  Skin:    General: Skin is warm and dry.  Neurological:     Mental Status: He is alert and oriented to person, place, and time.     Sensory: No sensory deficit.     Motor: No weakness.     Gait: Gait abnormal.     Deep Tendon Reflexes: Reflexes normal.    Labs reviewed: Basic Metabolic Panel: Recent Labs    09/21/20 1224 09/26/20 1856 11/11/20 1230 11/24/20 1311 02/08/21 1451 02/08/21 1457  NA  --    < > 140 138 139  --   K  --    < > 4.8 4.1 4.5  --   CL  --    < > 102 105 108  --   CO2  --    < > 24 28 25   --   GLUCOSE  --    < >  167* 70 100*  --   BUN  --    < > 18 7 17   --   CREATININE  --    < > 1.56* 1.18 1.47*  --   CALCIUM  --    < > 9.4 9.1 9.0  --   TSH 1.92  --   --   --   --  2.744   < > = values in this interval not displayed.   Liver Function Tests: Recent Labs    09/14/20 0956 09/26/20 2056 02/08/21 1451  AST 24 24 18   ALT 24 24 15   ALKPHOS 70 62 53  BILITOT 1.1 0.6 0.2*  PROT 7.3 6.7 6.5  ALBUMIN 3.8 3.6 3.6   Recent Labs    09/26/20 2056  LIPASE 35   No results for input(s): AMMONIA in the last 8760 hours. CBC: Recent Labs    09/26/20 1856 11/24/20 1311 02/08/21 1451  WBC 5.8 5.7 4.7  HGB 15.2 14.1 15.8  HCT 44.7 45.1 46.7  MCV 86.6 91.7 90.3  PLT 229 247 250   Lipid Panel: Recent Labs    05/25/20 1202  CHOL 129  HDL 54  LDLCALC 58  TRIG 85  CHOLHDL 2.4   TSH: Recent Labs    09/21/20 1224  02/08/21 1457  TSH 1.92 2.744   A1C: Lab Results  Component Value Date   HGBA1C 7.6 (H) 02/08/2021     Assessment/Plan 1. Acute bilateral low back pain with right-sided sciatica Low back pain noted, no injury noted. Using walking to help with mobility. No warning signs at this time. He has frequent high blood sugars so would like to hold off on prednisone and can not take NSAID due to blood thinner.  -encouraged to use heating pad with muscle rub after heat -can use tylenol PRN 1000 mg by mouth every 8 hours as needed pain - Ambulatory referral to [facility] [facility] for PT/OT - he is able to drive - gabapentin (NEURONTIN) 100 MG capsule; Take 1 capsule (100 mg total) by mouth 3 (three) times daily.  Dispense: 90 capsule; Refill: 0   2. Gastroesophageal reflux disease without esophagitis Information given for lifestyle and diet modifications.  - pantoprazole (PROTONIX) 40 MG tablet; Take 1 tablet (40 mg total) by mouth daily.  Dispense: 30 tablet; Refill: 3  3. Fecal smearing -to increase fiber in diet.  -metamucil or benefiber daily -increase water.   4. Enlarged and hypertrophic nails -trimmed nails bilaterally due to being abnormally long, curving and growing into skin. No breakdown noted at this time. Nails cut and pt tolerated well.   5. Memory loss -ongoing, plans to restart aricept. Following with neurology  6. Chronic systolic and diastolic heart failure.  Followed by heart failure clinic. Ongoing DOE.  -continues on farxiga, spironolactone, entresto and coreg.  -digoxin added today by cardiology  7 Type 2 diabetes mellitus with diabetic neuropathy, with long-term current use of insulin ([place]) Followed by endocrine, last a1c 7.6. continue current regimen. No recent hypoglycemia  8. Essential hypertension Blood pressure well controlled Continue current medications Recheck metabolic panel     Next appt: 6 weeks.  Carlos American. [place], [facility] [phone]

## 2021-04-05 ENCOUNTER — Ambulatory Visit (HOSPITAL_COMMUNITY)
Admission: EM | Admit: 2021-04-05 | Discharge: 2021-04-05 | Disposition: A | Discharge status: Home / Self Care | Payer: Medicare Other

## 2021-04-05 ENCOUNTER — Encounter (HOSPITAL_COMMUNITY): Payer: Self-pay | Admitting: Emergency Medicine

## 2021-04-05 ENCOUNTER — Other Ambulatory Visit: Payer: Self-pay

## 2021-04-05 DIAGNOSIS — H65111 Acute and subacute allergic otitis media (mucoid) (sanguinous) (serous), right ear: Secondary | ICD-10-CM | POA: Diagnosis not present

## 2021-04-05 NOTE — ED Provider Notes (Signed)
[place]    CSN: [id number] Arrival date & time: 04/05/21  1825      History   Chief Complaint Chief Complaint  Patient presents with   Ear Fullness    HPI Calvin [name] is a 60 y.o. male with a history of nonischemic cardiomyopathy, paroxysmal atrial fibrillation on anticoagulation with Eliquis, hypertension, hyperlipidemia, diabetes mellitus, severe diabetic retinopathy, sleep apnea and mild cognitive impairment due to memory loss.  He complains today of a sensation of fullness in his right ear.  Patient states he has never had this before, does not have a similar sensation in his left ear, denies any pain, recent upper respiratory infection, known diagnosis of hearing loss, he of multiple ear infections.  Ear Fullness   Past Medical History:  Diagnosis Date   Adenoidal hypertrophy    Adenomatous colon polyps 2012   AKI (acute kidney injury) ([place]) 06/17/2019   Arthritis    Cataract    Mixed OU   COVID-19 virus detected 06/18/2019   Diabetes mellitus    Diabetic retinopathy ([place])    NPDR OU   GERD (gastroesophageal reflux disease)    Heart attack ([place])    [place]   Hx of adenomatous colonic polyps 08/18/2017   Hyperlipidemia    Hypertension    Hypertensive retinopathy    OU   Mild CAD    a. cath in 08/2017 showing mild nonobstructive CAD with scattered 20-30% stenosis.    Nonischemic cardiomyopathy ([place])    a. EF 20-25% by echo in 09/2017 with cath showing mild CAD. b.  Last echo 12/2017 EF 35-40%, grade 2 DD.   Obesity    PAF (paroxysmal atrial fibrillation) ([place])    Sleep apnea    cpap, pt says no longer has    Patient Active Problem List   Diagnosis Date Noted   Adrenal nodule ([place]) 09/21/2020   Intermittent claudication ([place]) 05/25/2020   Hypoglycemia due to insulin 10/15/2019   Chronic anticoagulation 10/15/2019   Right sided weakness 06/17/2019   Foot pain, bilateral 02/01/2019   Overgrown toenails 12/20/2018   Unsteady  gait 12/20/2018   Encounter for therapeutic drug monitoring 10/22/2018   AF (paroxysmal atrial fibrillation) ([place]) 03/10/2018   Diabetic neuropathy ([place]) 09/26/2017   MCI (mild cognitive impairment) 06/23/2017   OSA on CPAP 05/10/2017   Gastroesophageal reflux disease 05/10/2017   Insomnia 05/10/2017   Onychomycosis of multiple toenails with type 2 diabetes mellitus and peripheral neuropathy ([place]) 90/24/0973   Chronic systolic heart failure ([place]) 11/22/2015   Essential hypertension 11/22/2015   DM (diabetes mellitus) ([place]) 11/20/2015   HLD (hyperlipidemia) 11/20/2015   Hx of adenomatous colonic polyps 05/19/2011    Past Surgical History:  Procedure Laterality Date   COLONOSCOPY  05/13/11, 08/2017   9 adenomas   FOREARM SURGERY     MUSCLE BIOPSY     POLYPECTOMY     RIGHT/LEFT HEART CATH AND CORONARY ANGIOGRAPHY N/A 08/25/2017   Procedure: RIGHT/LEFT HEART CATH AND CORONARY ANGIOGRAPHY;  Surgeon: [name], MD;  Location: [facility];  Service: Cardiovascular;  Laterality: N/A;   RIGHT/LEFT HEART CATH AND CORONARY ANGIOGRAPHY N/A 10/01/2019   Procedure: RIGHT/LEFT HEART CATH AND CORONARY ANGIOGRAPHY;  Surgeon: [name], MD;  Location: [facility];  Service: Cardiovascular;  Laterality: N/A;       Home Medications    Prior to Admission medications   Medication Sig Start Date End Date Taking? Authorizing Provider  albuterol (VENTOLIN HFA)  108 (90 Base) MCG/ACT inhaler Inhale 2 puffs into the lungs every 6 (six) hours as needed for wheezing or shortness of breath. 06/30/19   Thurnell Lose, MD  atorvastatin (LIPITOR) 80 MG tablet Take 1 tablet (80 mg total) by mouth daily. 08/07/20 08/02/21  [name], [name], MD  bisacodyl (DULCOLAX) 5 MG EC tablet Take 2 tablets (10 mg total) by mouth every other day. Take at bedtime 12/04/20   [name], MD  carvedilol (COREG) 6.25 MG tablet Take 0.5 tablets (3.125 mg total) by mouth 2 (two) times daily  with a meal. 04/01/21   Joette Catching, [name]  dapagliflozin propanediol (FARXIGA) 10 MG TABS tablet Take 1 tablet (10 mg total) by mouth daily before breakfast. 06/24/20   [name], [name], MD  digoxin (LANOXIN) 0.125 MG tablet Take 1 tablet (0.125 mg total) by mouth daily. 04/01/21   Joette Catching, [name]  donepezil (ARICEPT) 10 MG tablet Take 1 tablet (10 mg total) by mouth at bedtime. 03/31/21   Wertman, Coralee Pesa, [name]  ELIQUIS 5 MG TABS tablet TAKE ONE TABLET BY MOUTH EVERY MORNING and TAKE ONE TABLET BY MOUTH EVERYDAY AT BEDTIME 02/03/21   Larey Dresser, MD  ezetimibe (ZETIA) 10 MG tablet TAKE ONE TABLET BY MOUTH EVERY MORNING 03/15/21   [name], MD  gabapentin (NEURONTIN) 100 MG capsule Take 1 capsule (100 mg total) by mouth 3 (three) times daily. 04/01/21   [name], NP  insulin degludec (TRESIBA FLEXTOUCH) 200 UNIT/ML FlexTouch Pen Inject 52 Units into the skin daily. 10/15/20   [name], MD  Insulin Syringe-Needle U-100 (INSULIN SYRINGE .3CC/29GX1/2") 29G X 1/2" 0.3 ML MISC Inject 1 Syringe 3 (three) times daily as directed. Check blood sugar three times daily. Dx: E11.9 Patient taking differently: Inject 1 Syringe as directed 3 (three) times daily. Check blood sugar three times daily. Dx: E11.9 05/10/17   [name], DO  Lancets (ONETOUCH DELICA PLUS [id number]) [place] USE TWICE DAILY 10/30/19   [name], MD  nitroGLYCERIN (NITROSTAT) 0.4 MG SL tablet Place 1 tablet (0.4 mg total) under the tongue every 5 (five) minutes as needed for chest pain. 10/27/17   [name], DO  [facility] ULTRA test strip USE TWICE DAILY 10/30/19   [name], MD  pantoprazole (PROTONIX) 40 MG tablet Take 1 tablet (40 mg total) by mouth daily. 04/01/21   [name], NP  polyethylene glycol (MIRALAX / GLYCOLAX) 17 g packet Take 17 g by mouth 2 (two) times daily. 10/06/20   [name], MD  REPATHA 140 MG/ML SOSY INJECT 1 PEN INTO THE SKIN EVERY 14 DAYS 09/08/20    [name], [name], MD  sacubitril-valsartan (ENTRESTO) 24-26 MG Take 1 tablet by mouth 2 (two) times daily. 11/05/20   Clegg, Amy D, NP  spironolactone (ALDACTONE) 25 MG tablet Take 1 tablet (25 mg total) by mouth daily. 10/15/20   Consuelo Pandy, [name]    Family History Family History  Problem Relation Age of Onset   Heart disease Mother    Heart attack Mother 49   Hypertension Mother    Hyperlipidemia Mother    Diabetes Mother    Heart disease Father    Heart attack Father 37   Hypertension Father    Hyperlipidemia Father    Diabetes Father    Heart attack Sister 35   Colon cancer Neg Hx    Stomach cancer Neg Hx    Esophageal cancer Neg Hx  Rectal cancer Neg Hx    Colon polyps Neg Hx     Social History Social History   Tobacco Use   Smoking status: Never   Smokeless tobacco: Never  Vaping Use   Vaping Use: Never used  Substance Use Topics   Alcohol use: No   Drug use: No     Allergies   Patient has no known allergies.   Review of Systems Review of Systems Pertinent findings noted in history of present illness.    Physical Exam Triage Vital Signs ED Triage Vitals  Enc Vitals Group     BP 04/05/21 1903 110/74     Pulse Rate 04/05/21 1903 (!) 108     Resp 04/05/21 1903 18     Temp 04/05/21 1903 98.1 F (36.7 C)     Temp Source 04/05/21 1903 Oral     SpO2 04/05/21 1903 100 %     Weight --      Height --      Head Circumference --      Peak Flow --      Pain Score 04/05/21 1901 0     Pain Loc --      Pain Edu? --      Excl. in [place]? --    No data found.  Updated Vital Signs BP 110/74 (BP Location: Left Arm)   Pulse (!) 108   Temp 98.1 F (36.7 C) (Oral)   Resp 18   SpO2 100%   Visual Acuity Right Eye Distance:   Left Eye Distance:   Bilateral Distance:    Right Eye Near:   Left Eye Near:    Bilateral Near:     Physical Exam Vitals and nursing note reviewed.  Constitutional:      Appearance: Normal appearance.  HENT:      Head: Normocephalic and atraumatic.     Right Ear: Ear canal and external ear normal. A middle ear effusion (Mucoid) is present. There is hemotympanum. Tympanic membrane has decreased mobility.     Left Ear: Tympanic membrane, ear canal and external ear normal.     Nose: Nose normal.     Right Turbinates: Enlarged, swollen and pale.     Left Turbinates: Enlarged, swollen and pale.     Right Sinus: No maxillary sinus tenderness or frontal sinus tenderness.     Left Sinus: No maxillary sinus tenderness or frontal sinus tenderness.     Mouth/Throat:     Mouth: Mucous membranes are moist.     Pharynx: Oropharynx is clear.  Eyes:     Extraocular Movements: Extraocular movements intact.     Conjunctiva/sclera: Conjunctivae normal.     Pupils: Pupils are equal, round, and reactive to light.  Cardiovascular:     Rate and Rhythm: Normal rate and regular rhythm.     Heart sounds: Normal heart sounds.  Pulmonary:     Effort: Pulmonary effort is normal.     Breath sounds: Normal breath sounds.  Abdominal:     General: Abdomen is flat. Bowel sounds are normal.     Palpations: Abdomen is soft.  Musculoskeletal:        General: Normal range of motion.     Cervical back: Normal range of motion and neck supple.  Skin:    General: Skin is warm and dry.  Neurological:     General: No focal deficit present.     Mental Status: He is alert and oriented to person, place, and time.  Psychiatric:  Mood and Affect: Mood normal.        Behavior: Behavior normal.     UC Treatments / Results  Labs (all labs ordered are listed, but only abnormal results are displayed) Labs Reviewed - No data to display  EKG   Radiology No results found.  Procedures Procedures (including critical care time)  Medications Ordered in UC Medications - No data to display  Initial Impression / Assessment and Plan / UC Course  I have reviewed the triage vital signs and the nursing notes.  Pertinent labs &  imaging results that were available during my care of the patient were reviewed by me and considered in my medical decision making (see chart for details).     Because patient has no complaint of pain or discomfort at this time, based on my physical exam findings, I recommend he follow-up with ear nose and throat to have his ear evaluated and possibly addressed.  Patient was provided with the name of a ear nose and throat provider.  Patient verbalized understanding of plan as outlined, patient was in agreement, all questions were addressed. Final Clinical Impressions(s) / UC Diagnoses   Final diagnoses:  Acute mucoid otitis media of right ear     Discharge Instructions      Your right inner ear is full of thick material which is causing you to have a sensation of fullness and a loss of hearing.  I recommend that you reach out to an ear nose and throat provider to have your ear further evaluated and possibly treated.  I have enclosed the name of an ENT practice here in the [place] area, please give them a call in the morning.     ED Prescriptions   None    PDMP not reviewed this encounter.   Lynden Oxford Scales, [place] 04/05/21 [phone]

## 2021-04-05 NOTE — Discharge Instructions (Addendum)
Your right inner ear is full of thick material which is causing you to have a sensation of fullness and a loss of hearing.  I recommend that you reach out to an ear nose and throat provider to have your ear further evaluated and possibly treated.  I have enclosed the name of an ENT practice here in the [place] area, please give them a call in the morning.

## 2021-04-05 NOTE — ED Triage Notes (Signed)
C/o bilat ear fullness for 2 days.

## 2021-04-06 ENCOUNTER — Encounter: Payer: Self-pay | Admitting: *Deleted

## 2021-04-06 ENCOUNTER — Telehealth (HOSPITAL_COMMUNITY): Payer: Self-pay | Admitting: *Deleted

## 2021-04-06 ENCOUNTER — Other Ambulatory Visit (HOSPITAL_COMMUNITY): Payer: Self-pay | Admitting: *Deleted

## 2021-04-06 ENCOUNTER — Other Ambulatory Visit (HOSPITAL_COMMUNITY): Payer: Self-pay

## 2021-04-06 DIAGNOSIS — Z1152 Encounter for screening for COVID-19: Secondary | ICD-10-CM | POA: Diagnosis not present

## 2021-04-06 MED ORDER — CARVEDILOL 3.125 MG PO TABS: 3.1250 mg | ORAL_TABLET | Freq: Two times a day (BID) | ORAL | 3 refills | Status: DC

## 2021-04-06 NOTE — Progress Notes (Signed)
Paramedicine Encounter    Patient ID: Calvin [name], male    DOB: Nov 26, 1960, 60 y.o.   MRN: [id number]   [name], NP as PCP - General (Geriatric Medicine) [name], MD as PCP - Cardiology (Cardiology) [name], [name], MD as PCP - Advanced Heart Failure (Cardiology) [name], MD as Consulting Physician (Endocrinology) Jorge Ny, LCSW as Social Worker (Licensed Clinical Social Worker) Pleasant, Eppie Gibson, RN as Crisp, Sara E, [name] (Neurology)  Patient Active Problem List   Diagnosis Date Noted   Adrenal nodule ([place]) 09/21/2020   Intermittent claudication ([place]) 05/25/2020   Hypoglycemia due to insulin 10/15/2019   Chronic anticoagulation 10/15/2019   Right sided weakness 06/17/2019   Foot pain, bilateral 02/01/2019   Overgrown toenails 12/20/2018   Unsteady gait 12/20/2018   Encounter for therapeutic drug monitoring 10/22/2018   AF (paroxysmal atrial fibrillation) ([place]) 03/10/2018   Diabetic neuropathy ([place]) 09/26/2017   MCI (mild cognitive impairment) 06/23/2017   OSA on CPAP 05/10/2017   Gastroesophageal reflux disease 05/10/2017   Insomnia 05/10/2017   Onychomycosis of multiple toenails with type 2 diabetes mellitus and peripheral neuropathy ([place]) 46/80/3212   Chronic systolic heart failure ([place]) 11/22/2015   Essential hypertension 11/22/2015   DM (diabetes mellitus) ([place]) 11/20/2015   HLD (hyperlipidemia) 11/20/2015   Hx of adenomatous colonic polyps 05/19/2011    Current Outpatient Medications:    albuterol (VENTOLIN HFA) 108 (90 Base) MCG/ACT inhaler, Inhale 2 puffs into the lungs every 6 (six) hours as needed for wheezing or shortness of breath., Disp: 6.7 g, Rfl: 0   atorvastatin (LIPITOR) 80 MG tablet, Take 1 tablet (80 mg total) by mouth daily., Disp: 90 tablet, Rfl: 3   bisacodyl (DULCOLAX) 5 MG EC tablet, Take 2 tablets (10 mg total) by mouth every other day.  Take at bedtime, Disp: 30 tablet, Rfl: 0   carvedilol (COREG) 6.25 MG tablet, Take 0.5 tablets (3.125 mg total) by mouth 2 (two) times daily with a meal., Disp: 60 tablet, Rfl: 4   dapagliflozin propanediol (FARXIGA) 10 MG TABS tablet, Take 1 tablet (10 mg total) by mouth daily before breakfast., Disp: 90 tablet, Rfl: 3   digoxin (LANOXIN) 0.125 MG tablet, Take 1 tablet (0.125 mg total) by mouth daily., Disp: 30 tablet, Rfl: 3   donepezil (ARICEPT) 10 MG tablet, Take 1 tablet (10 mg total) by mouth at bedtime., Disp: 30 tablet, Rfl: 11   ELIQUIS 5 MG TABS tablet, TAKE ONE TABLET BY MOUTH EVERY MORNING and TAKE ONE TABLET BY MOUTH EVERYDAY AT BEDTIME, Disp: 60 tablet, Rfl: 3   ezetimibe (ZETIA) 10 MG tablet, TAKE ONE TABLET BY MOUTH EVERY MORNING, Disp: 30 tablet, Rfl: 0   gabapentin (NEURONTIN) 100 MG capsule, Take 1 capsule (100 mg total) by mouth 3 (three) times daily., Disp: 90 capsule, Rfl: 0   insulin degludec (TRESIBA FLEXTOUCH) 200 UNIT/ML FlexTouch Pen, Inject 52 Units into the skin daily. (Patient taking differently: Inject 50 Units into the skin daily.), Disp: 18 mL, Rfl: 3   Insulin Syringe-Needle U-100 (INSULIN SYRINGE .3CC/29GX1/2") 29G X 1/2" 0.3 ML MISC, Inject 1 Syringe 3 (three) times daily as directed. Check blood sugar three times daily. Dx: E11.9 (Patient taking differently: Inject 1 Syringe as directed 3 (three) times daily. Check blood sugar three times daily. Dx: E11.9), Disp: 100 each, Rfl: 3   Lancets (ONETOUCH DELICA PLUS [id number]) MISC, USE TWICE DAILY, Disp: 200 each, Rfl: 3  pantoprazole (PROTONIX) 40 MG tablet, Take 1 tablet (40 mg total) by mouth daily., Disp: 30 tablet, Rfl: 3   polyethylene glycol (MIRALAX / GLYCOLAX) 17 g packet, Take 17 g by mouth 2 (two) times daily., Disp: 14 each, Rfl: 0   REPATHA 140 MG/ML SOSY, INJECT 1 PEN INTO THE SKIN EVERY 14 DAYS, Disp: 6 mL, Rfl: 3   sacubitril-valsartan (ENTRESTO) 24-26 MG, Take 1 tablet by mouth 2 (two) times daily.,  Disp: 60 tablet, Rfl: 11   spironolactone (ALDACTONE) 25 MG tablet, Take 1 tablet (25 mg total) by mouth daily., Disp: 90 tablet, Rfl: 3   nitroGLYCERIN (NITROSTAT) 0.4 MG SL tablet, Place 1 tablet (0.4 mg total) under the tongue every 5 (five) minutes as needed for chest pain. (Patient not taking: Reported on 04/06/2021), Disp: 30 tablet, Rfl: 1   ONETOUCH ULTRA test strip, USE TWICE DAILY, Disp: 200 strip, Rfl: 3 No Known Allergies    Social History   Socioeconomic History   Marital status: Single    Spouse name: Not on file   Number of children: 0   Years of education: Not on file   Highest education level: Not on file  Occupational History   Occupation: Disability  Tobacco Use   Smoking status: Never   Smokeless tobacco: Never  Vaping Use   Vaping Use: Never used  Substance and Sexual Activity   Alcohol use: No   Drug use: No   Sexual activity: Yes    Birth control/protection: None  Other Topics Concern   Not on file  Social History Narrative   Social History   Diet?    Do you drink/eat things with caffeine? yes   Marital status?       single      Do you live in a house, apartment, assisted living, condo, trailer, etc.? yes   Is it one or more stories? One story   How many persons live in your home?   Do you have any pets in your home? (please list)    Highest level of education completed? graduate   Do you exercise?            no                          Type & how often?   Advanced Directives   Do you have a living will? no   Do you have a DNR form?                                  If not, do you want to discuss one? no   Do you have signed POA/HPOA for forms? no      Functional Status   Do you have difficulty bathing or dressing yourself? no   Do you have difficulty preparing food or eating? no   Do you have difficulty managing your medications? no   Do you have difficulty managing your finances? no   Do you have difficulty affording your medications? no    Social Determinants of Health   Financial Resource Strain: Not on file  Food Insecurity: No Food Insecurity   Worried About Running Out of Food in the Last Year: Never true   Ran Out of Food in the Last Year: Never true  Transportation Needs: No Transportation Needs   Lack of Transportation (Medical): No   Lack of Transportation (  Non-Medical): No  Physical Activity: Not on file  Stress: Not on file  Social Connections: Not on file  Intimate Partner Violence: Not on file    Physical Exam      Future Appointments  Date Time Provider [place]  04/12/2021  1:00 PM Pleasant, Eppie Gibson, RN [facility] None  04/16/2021  9:00 AM [name], MD [facility] None  05/12/2021  2:00 PM [name], [name], MD [facility] None  05/14/2021 11:00 AM [name], NP [facility]SC-PSC None  05/17/2021  1:00 PM [name], MD [facility] None  08/24/2021  8:30 AM Hazle Coca, PhD [facility]BN[facility] None  08/24/2021  9:30 AM LBN- NEUROPSYCH TECH LBN-[facility] None  08/31/2021  2:00 PM Hazle Coca, PhD [facility] None  09/17/2021  1:00 PM Shawn Route, Coralee Pesa, [name] [facility] None    BP (!) 146/82   Pulse 80   Resp 18   Wt 178 lb (80.7 kg)   SpO2 98%   BMI 28.73 kg/m   Weight yesterday-178 Last visit weight-176 @ clinic CBG's-170-180 Pt reports poor energy and feeling tired, he is having heart cath done tomor.  He was seen in clinic last week and they made some med changes. [facility] sent it out in bottles but they did not have the decrease in carvedilol listed, so I sent triage team message to update that.  He cant see the directions to the bottles due to print being so small, I just moved his pills back to pill box and will fill until the pill dose packs are sent out to place everything together and to make the carvedilol change as well.  He has 2 wks of meds right now, will f/u post heart cath.    Calvin [name], [facility] Paramedic  04/06/21

## 2021-04-07 ENCOUNTER — Encounter (HOSPITAL_COMMUNITY)
Admission: RE | Disposition: A | Discharge status: Home / Self Care | Payer: Self-pay | Source: Home / Self Care | Attending: Internal Medicine

## 2021-04-07 ENCOUNTER — Encounter (HOSPITAL_COMMUNITY): Payer: Self-pay | Admitting: Internal Medicine

## 2021-04-07 ENCOUNTER — Ambulatory Visit (HOSPITAL_COMMUNITY)
Admission: RE | Admit: 2021-04-07 | Discharge: 2021-04-07 | Disposition: A | Discharge status: Home / Self Care | Payer: Medicare Other | Attending: Internal Medicine | Admitting: Internal Medicine

## 2021-04-07 DIAGNOSIS — E1165 Type 2 diabetes mellitus with hyperglycemia: Secondary | ICD-10-CM | POA: Insufficient documentation

## 2021-04-07 DIAGNOSIS — Z794 Long term (current) use of insulin: Secondary | ICD-10-CM | POA: Diagnosis not present

## 2021-04-07 DIAGNOSIS — I5022 Chronic systolic (congestive) heart failure: Secondary | ICD-10-CM | POA: Diagnosis not present

## 2021-04-07 DIAGNOSIS — I2510 Atherosclerotic heart disease of native coronary artery without angina pectoris: Secondary | ICD-10-CM | POA: Diagnosis not present

## 2021-04-07 DIAGNOSIS — I5042 Chronic combined systolic (congestive) and diastolic (congestive) heart failure: Secondary | ICD-10-CM | POA: Insufficient documentation

## 2021-04-07 DIAGNOSIS — Z79899 Other long term (current) drug therapy: Secondary | ICD-10-CM | POA: Insufficient documentation

## 2021-04-07 DIAGNOSIS — Z833 Family history of diabetes mellitus: Secondary | ICD-10-CM | POA: Diagnosis not present

## 2021-04-07 DIAGNOSIS — I428 Other cardiomyopathies: Secondary | ICD-10-CM | POA: Diagnosis not present

## 2021-04-07 DIAGNOSIS — K219 Gastro-esophageal reflux disease without esophagitis: Secondary | ICD-10-CM | POA: Diagnosis not present

## 2021-04-07 DIAGNOSIS — E785 Hyperlipidemia, unspecified: Secondary | ICD-10-CM | POA: Insufficient documentation

## 2021-04-07 DIAGNOSIS — Z7901 Long term (current) use of anticoagulants: Secondary | ICD-10-CM | POA: Insufficient documentation

## 2021-04-07 DIAGNOSIS — I480 Paroxysmal atrial fibrillation: Secondary | ICD-10-CM | POA: Insufficient documentation

## 2021-04-07 DIAGNOSIS — Z8249 Family history of ischemic heart disease and other diseases of the circulatory system: Secondary | ICD-10-CM | POA: Insufficient documentation

## 2021-04-07 DIAGNOSIS — Z8616 Personal history of COVID-19: Secondary | ICD-10-CM | POA: Insufficient documentation

## 2021-04-07 DIAGNOSIS — I110 Hypertensive heart disease with heart failure: Secondary | ICD-10-CM | POA: Insufficient documentation

## 2021-04-07 HISTORY — PX: RIGHT/LEFT HEART CATH AND CORONARY ANGIOGRAPHY: CATH118266

## 2021-04-07 LAB — GLUCOSE, CAPILLARY: Glucose-Capillary: 153 mg/dL — ABNORMAL HIGH (ref 70–99)

## 2021-04-07 LAB — POCT I-STAT EG7
Acid-Base Excess: 0 mmol/L (ref 0.0–2.0)
Acid-base deficit: 1 mmol/L (ref 0.0–2.0)
Acid-base deficit: 2 mmol/L (ref 0.0–2.0)
Bicarbonate: 23.3 mmol/L (ref 20.0–28.0)
Bicarbonate: 24.9 mmol/L (ref 20.0–28.0)
Bicarbonate: 26.5 mmol/L (ref 20.0–28.0)
Calcium, Ion: 0.95 mmol/L — ABNORMAL LOW (ref 1.15–1.40)
Calcium, Ion: 1.06 mmol/L — ABNORMAL LOW (ref 1.15–1.40)
Calcium, Ion: 1.18 mmol/L (ref 1.15–1.40)
HCT: 37 % — ABNORMAL LOW (ref 39.0–52.0)
HCT: 39 % (ref 39.0–52.0)
HCT: 41 % (ref 39.0–52.0)
Hemoglobin: 12.6 g/dL — ABNORMAL LOW (ref 13.0–17.0)
Hemoglobin: 13.3 g/dL (ref 13.0–17.0)
Hemoglobin: 13.9 g/dL (ref 13.0–17.0)
O2 Saturation: 73 %
O2 Saturation: 74 %
O2 Saturation: 79 %
Potassium: 3.1 mmol/L — ABNORMAL LOW (ref 3.5–5.1)
Potassium: 3.4 mmol/L — ABNORMAL LOW (ref 3.5–5.1)
Potassium: 3.7 mmol/L (ref 3.5–5.1)
Sodium: 140 mmol/L (ref 135–145)
Sodium: 142 mmol/L (ref 135–145)
Sodium: 145 mmol/L (ref 135–145)
TCO2: 25 mmol/L (ref 22–32)
TCO2: 26 mmol/L (ref 22–32)
TCO2: 28 mmol/L (ref 22–32)
pCO2, Ven: 42.6 mmHg — ABNORMAL LOW (ref 44.0–60.0)
pCO2, Ven: 44.5 mmHg (ref 44.0–60.0)
pCO2, Ven: 47 mmHg (ref 44.0–60.0)
pH, Ven: 7.346 (ref 7.250–7.430)
pH, Ven: 7.356 (ref 7.250–7.430)
pH, Ven: 7.36 (ref 7.250–7.430)
pO2, Ven: 41 mmHg (ref 32.0–45.0)
pO2, Ven: 41 mmHg (ref 32.0–45.0)
pO2, Ven: 46 mmHg — ABNORMAL HIGH (ref 32.0–45.0)

## 2021-04-07 LAB — POCT I-STAT 7, (LYTES, BLD GAS, ICA,H+H)
Acid-Base Excess: 0 mmol/L (ref 0.0–2.0)
Bicarbonate: 25.4 mmol/L (ref 20.0–28.0)
Calcium, Ion: 1.2 mmol/L (ref 1.15–1.40)
HCT: 41 % (ref 39.0–52.0)
Hemoglobin: 13.9 g/dL (ref 13.0–17.0)
O2 Saturation: 95 %
Potassium: 3.7 mmol/L (ref 3.5–5.1)
Sodium: 140 mmol/L (ref 135–145)
TCO2: 27 mmol/L (ref 22–32)
pCO2 arterial: 42.3 mmHg (ref 32.0–48.0)
pH, Arterial: 7.387 (ref 7.350–7.450)
pO2, Arterial: 76 mmHg — ABNORMAL LOW (ref 83.0–108.0)

## 2021-04-07 SURGERY — RIGHT/LEFT HEART CATH AND CORONARY ANGIOGRAPHY
Anesthesia: LOCAL

## 2021-04-07 MED ORDER — SODIUM CHLORIDE 0.9 % IV SOLN: INTRAVENOUS | Status: DC

## 2021-04-07 MED ORDER — SODIUM CHLORIDE 0.9% FLUSH: 3.0000 mL | INTRAVENOUS | Status: DC | PRN

## 2021-04-07 MED ORDER — VERAPAMIL HCL 2.5 MG/ML IV SOLN
INTRAVENOUS | Status: DC | PRN
  Administered 2021-04-07: 10 mL via INTRA_ARTERIAL

## 2021-04-07 MED ORDER — FENTANYL CITRATE (PF) 100 MCG/2ML IJ SOLN
INTRAMUSCULAR | Status: DC | PRN
  Administered 2021-04-07: 25 ug via INTRAVENOUS

## 2021-04-07 MED ORDER — ASPIRIN 81 MG PO CHEW
81.0000 mg | CHEWABLE_TABLET | ORAL | Status: AC
  Administered 2021-04-07: 81 mg via ORAL

## 2021-04-07 MED ORDER — LABETALOL HCL 5 MG/ML IV SOLN: 10.0000 mg | INTRAVENOUS | Status: DC | PRN

## 2021-04-07 MED ORDER — VERAPAMIL HCL 2.5 MG/ML IV SOLN
INTRAVENOUS | Status: AC
  Filled 2021-04-07: qty 2

## 2021-04-07 MED ORDER — LIDOCAINE HCL (PF) 1 % IJ SOLN
INTRAMUSCULAR | Status: DC | PRN
  Administered 2021-04-07 (×2): 2 mL

## 2021-04-07 MED ORDER — MIDAZOLAM HCL 2 MG/2ML IJ SOLN
INTRAMUSCULAR | Status: AC
  Filled 2021-04-07: qty 2

## 2021-04-07 MED ORDER — SODIUM CHLORIDE 0.9% FLUSH: 3.0000 mL | Freq: Two times a day (BID) | INTRAVENOUS | Status: DC

## 2021-04-07 MED ORDER — FENTANYL CITRATE (PF) 100 MCG/2ML IJ SOLN
INTRAMUSCULAR | Status: AC
  Filled 2021-04-07: qty 2

## 2021-04-07 MED ORDER — ACETAMINOPHEN 325 MG PO TABS
650.0000 mg | ORAL_TABLET | ORAL | Status: DC | PRN
Start: 2021-04-07 — End: 2021-04-07

## 2021-04-07 MED ORDER — HEPARIN (PORCINE) IN NACL 1000-0.9 UT/500ML-% IV SOLN
INTRAVENOUS | Status: DC | PRN
  Administered 2021-04-07 (×2): 500 mL

## 2021-04-07 MED ORDER — ASPIRIN 81 MG PO CHEW
CHEWABLE_TABLET | ORAL | Status: AC
  Filled 2021-04-07: qty 1

## 2021-04-07 MED ORDER — HEPARIN SODIUM (PORCINE) 1000 UNIT/ML IJ SOLN
INTRAMUSCULAR | Status: DC | PRN
  Administered 2021-04-07: 4000 [IU] via INTRAVENOUS

## 2021-04-07 MED ORDER — SODIUM CHLORIDE 0.9 % IV SOLN: 250.0000 mL | INTRAVENOUS | Status: DC | PRN

## 2021-04-07 MED ORDER — IOHEXOL 350 MG/ML SOLN
INTRAVENOUS | Status: DC | PRN
  Administered 2021-04-07: 50 mL

## 2021-04-07 MED ORDER — LIDOCAINE HCL (PF) 1 % IJ SOLN
INTRAMUSCULAR | Status: AC
  Filled 2021-04-07: qty 30

## 2021-04-07 MED ORDER — HEPARIN SODIUM (PORCINE) 1000 UNIT/ML IJ SOLN
INTRAMUSCULAR | Status: AC
  Filled 2021-04-07: qty 1

## 2021-04-07 MED ORDER — HYDRALAZINE HCL 20 MG/ML IJ SOLN: 10.0000 mg | INTRAMUSCULAR | Status: DC | PRN

## 2021-04-07 MED ORDER — ONDANSETRON HCL 4 MG/2ML IJ SOLN: 4.0000 mg | Freq: Four times a day (QID) | INTRAMUSCULAR | Status: DC | PRN

## 2021-04-07 MED ORDER — HEPARIN (PORCINE) IN NACL 1000-0.9 UT/500ML-% IV SOLN
INTRAVENOUS | Status: AC
  Filled 2021-04-07: qty 1000

## 2021-04-07 MED ORDER — MIDAZOLAM HCL 2 MG/2ML IJ SOLN
INTRAMUSCULAR | Status: DC | PRN
  Administered 2021-04-07: 1 mg via INTRAVENOUS

## 2021-04-07 SURGICAL SUPPLY — 9 items
CATH 5FR JL3.5 JR4 ANG PIG MP (CATHETERS) ×1 IMPLANT
CATH BALLN WEDGE 5F 110CM (CATHETERS) ×1 IMPLANT
DEVICE RAD COMP TR BAND LRG (VASCULAR PRODUCTS) ×1 IMPLANT
GLIDESHEATH SLEND SS 6F .021 (SHEATH) ×1 IMPLANT
GUIDEWIRE INQWIRE 1.5J.035X260 (WIRE) IMPLANT
INQWIRE 1.5J .035X260CM (WIRE) ×2
PACK CARDIAC CATHETERIZATION (CUSTOM PROCEDURE TRAY) ×2 IMPLANT
SHEATH GLIDE SLENDER 4/5FR (SHEATH) ×1 IMPLANT
TRANSDUCER W/STOPCOCK (MISCELLANEOUS) ×2 IMPLANT

## 2021-04-07 NOTE — Interval H&P Note (Signed)
History and Physical Interval Note:  04/07/2021 9:17 AM  Calvin [name]  has presented today for surgery, with the diagnosis of heart failure.  The various methods of treatment have been discussed with the patient and family. After consideration of risks, benefits and other options for treatment, the patient has consented to  Procedure(s): RIGHT/LEFT HEART CATH AND CORONARY ANGIOGRAPHY (N/A) and possible coronary angioplasty as a surgical intervention.  The patient's history has been reviewed, patient examined, no change in status, stable for surgery.  I have reviewed the patient's chart and labs.  Questions were answered to the patient's satisfaction.     Margaretta Chittum

## 2021-04-07 NOTE — Progress Notes (Signed)
Patient stated that he was unaware of when the last time he took his Eliquis, but he believes that it was on Sunday. Valetta Fuller, RN that does his medications stated that she did not take it out of his pill box, so she believes that he took it last night. Anderson Malta, RN made aware and also need for orders.

## 2021-04-07 NOTE — Addendum Note (Signed)
Encounter addended by: [name], MD on: 04/07/2021 9:16 AM  Actions taken: Clinical Note Signed, Level of Service modified

## 2021-04-12 ENCOUNTER — Other Ambulatory Visit: Payer: Self-pay | Admitting: *Deleted

## 2021-04-12 DIAGNOSIS — Z1152 Encounter for screening for COVID-19: Secondary | ICD-10-CM | POA: Diagnosis not present

## 2021-04-12 NOTE — Patient Instructions (Signed)
Goals Addressed             This Visit's Progress    [facility] - Monitor and Manage My Blood Sugar   Not on track    Barriers:Health Behaviors Knowledge Timeframe:  Long-Range Goal Priority:  High Start Date:  2/23//                           Expected End Date:  [id number] Follow Up Date [id number]   - check blood sugar at prescribed times - take the blood sugar log to all doctor visits    Why is this important?   Checking your blood sugar at home helps to keep it from getting very high or very low.  Writing the results in a diary or log helps the doctor know how to care for you.  Your blood sugar log should have the time, date and the results.  Also, write down the amount of insulin or other medicine that you take.  Other information, like what you ate, exercise done and how you were feeling, will also be helpful.     Notes:   11/8 - Reminded to monitor multiple times a day with insulin changes. Patient checks sugars regularly- patient reports sugars tend to be high in the am.    12/9 - Reviewed insulin changes as well as appointment with nutritionist  09/24/20 Patient sugars remain in the 300 range last A1c 8.4.  Encouraged patient to really watch his diet and limit sweets and carbohydrate containing food. 12/17/20 A1c up to 8.9 encouraged better sugar control.  Patient remains active through walking.   Diabetes Management Discussed: Medication adherence Reviewed importance of limiting carbs such as rice, potatoes, breads, and pastas. Also discussed limiting sweets and sugary drinks.  Discussed importance of portion control.  Also discussed importance of annual exams such as eye exam.   [id number] A1C 7.6 [id number] Patient reminded the importance of monitoring blood sugars as per ordered [id number] Patient is not checking blood sugars on a regular basis. Patient encouraged him to monitor blood sugars.     [facility] - Set My Target A1C   Not on track    Timeframe:  Long-Range Goal Priority:   Medium Start Date:  [id number]                           Expected End Date:      [id number] Follow Up Date [id number]   - set target A1C - Less than 8   Why is this important?   Your target A1C is decided together by you and your doctor.  It is based on several things like your age and other health issues.    Notes:   11/8 - decreased to 9.3 on 04/22/20 [id number]  A1C 7.6   12/9 - decreased to 8.9 in December 2021  08/26/20- A1c 8.4 08-11-20  09/24/20 patient continues to work at lowering A1c.  [id number] Patient A1C has decreased to 7.3 from 8.9 [id number] A1C has increased from 7.3 to 7.6

## 2021-04-12 NOTE — Patient Outreach (Signed)
[facility]) Care Management  [place]  04/12/2021   Elba Dendinger 1960-09-21 [id number]  RN Health Coach telephone call to patient.  [employer] compliance verified. Per patient he has not checked his blood sugar today. Patient has contacted transportation and now has [facility] transportation to take him to appointments. He is having rt ear pain and is awaiting call from Dr for appointment. Patient is uses a walker or cane when ambulating. He has not had any recent falls. Patient has agreed to follow up outreach calls.   Encounter Medications:  Outpatient Encounter Medications as of 04/12/2021  Medication Sig   albuterol (VENTOLIN HFA) 108 (90 Base) MCG/ACT inhaler Inhale 2 puffs into the lungs every 6 (six) hours as needed for wheezing or shortness of breath.   atorvastatin (LIPITOR) 80 MG tablet Take 1 tablet (80 mg total) by mouth daily.   bisacodyl (DULCOLAX) 5 MG EC tablet Take 2 tablets (10 mg total) by mouth every other day. Take at bedtime   carvedilol (COREG) 3.125 MG tablet Take 1 tablet (3.125 mg total) by mouth 2 (two) times daily with a meal.   dapagliflozin propanediol (FARXIGA) 10 MG TABS tablet Take 1 tablet (10 mg total) by mouth daily before breakfast.   digoxin (LANOXIN) 0.125 MG tablet Take 1 tablet (0.125 mg total) by mouth daily.   donepezil (ARICEPT) 10 MG tablet Take 1 tablet (10 mg total) by mouth at bedtime.   ELIQUIS 5 MG TABS tablet TAKE ONE TABLET BY MOUTH EVERY MORNING and TAKE ONE TABLET BY MOUTH EVERYDAY AT BEDTIME   ezetimibe (ZETIA) 10 MG tablet TAKE ONE TABLET BY MOUTH EVERY MORNING   gabapentin (NEURONTIN) 100 MG capsule Take 1 capsule (100 mg total) by mouth 3 (three) times daily.   insulin degludec (TRESIBA FLEXTOUCH) 200 UNIT/ML FlexTouch Pen Inject 52 Units into the skin daily. (Patient taking differently: Inject 50 Units into the skin daily.)   Insulin Syringe-Needle U-100 (INSULIN SYRINGE .3CC/29GX1/2") 29G X 1/2" 0.3 ML MISC  Inject 1 Syringe 3 (three) times daily as directed. Check blood sugar three times daily. Dx: E11.9 (Patient taking differently: Inject 1 Syringe as directed 3 (three) times daily. Check blood sugar three times daily. Dx: E11.9)   Lancets (ONETOUCH DELICA PLUS [id number]) MISC USE TWICE DAILY   nitroGLYCERIN (NITROSTAT) 0.4 MG SL tablet Place 1 tablet (0.4 mg total) under the tongue every 5 (five) minutes as needed for chest pain. (Patient not taking: Reported on 04/06/2021)   ONETOUCH ULTRA test strip USE TWICE DAILY   pantoprazole (PROTONIX) 40 MG tablet Take 1 tablet (40 mg total) by mouth daily.   polyethylene glycol (MIRALAX / GLYCOLAX) 17 g packet Take 17 g by mouth 2 (two) times daily.   REPATHA 140 MG/ML SOSY INJECT 1 PEN INTO THE SKIN EVERY 14 DAYS   sacubitril-valsartan (ENTRESTO) 24-26 MG Take 1 tablet by mouth 2 (two) times daily.   spironolactone (ALDACTONE) 25 MG tablet Take 1 tablet (25 mg total) by mouth daily.   No facility-administered encounter medications on file as of 04/12/2021.    Functional Status:  In your present state of health, do you have any difficulty performing the following activities: 09/24/2020 07/23/2020  Hearing? N -  Vision? N Y  Difficulty concentrating or making decisions? Y Y  Comment some memory issues -  Walking or climbing stairs? Y Y  Comment patient reports some shortness of breath. -  Dressing or bathing? N N  Doing errands, shopping? Tempie Donning  Comment family assists does not drive often  Preparing Food and eating ? N N  Using the Toilet? N N  In the past six months, have you accidently leaked urine? N Y  Do you have problems with loss of bowel control? N N  Managing your Medications? Y Y  Comment paramedicine sees patient for med fills -  Managing your Finances? Tempie Donning  Comment family assists -  Housekeeping or managing your Housekeeping? Tempie Donning  Comment family assists -  Some recent data might be hidden    Fall/Depression Screening: Fall Risk   04/12/2021 03/31/2021 03/11/2021  Falls in the past year? 1 1 1   Number falls in past yr: 1 1 0  Injury with Fall? 0 0 0  Risk for fall due to : History of fall(s);Impaired balance/gait;Impaired mobility - Impaired balance/gait;History of fall(s);Impaired mobility  Follow up Falls evaluation completed - Falls evaluation completed   PHQ 2/9 Scores 12/17/2020 09/24/2020 07/23/2020 07/01/2020 05/25/2020 07/15/2019 05/01/2019  PHQ - 2 Score 0 0 0 1 0 0 0    Assessment:   Care Plan Care Plan : Diabetes Type 2 (Adult)  Updates made by Verlin Grills, RN since 04/12/2021 12:00 AM     Problem: Glycemic Management (Diabetes, Type 2)   Priority: Medium     Long-Range Goal: Glycemic Management Optimized as evidenced by A1c less than 8.4   Start Date: 04/17/2020  Expected End Date: 08/02/2020  This Visit's Progress: On track  Recent Progress: On track  Priority: High  Note:   Evidence-based guidance:  Anticipate A1C testing (point-of-care) every 3 to 6 months based on goal attainment.  Review mutually-set A1C goal or target range.   Notes:  [id number] Patient A1C has decreased from 8.9 to 7.3 [id number] A1C is 7.6      Goals Addressed             This Visit's Progress    [facility] - Monitor and Manage My Blood Sugar   Not on track    Barriers:Health Behaviors Knowledge Timeframe:  Long-Range Goal Priority:  High Start Date:  2/23//                           Expected End Date:  [id number] Follow Up Date [id number]   - check blood sugar at prescribed times - take the blood sugar log to all doctor visits    Why is this important?   Checking your blood sugar at home helps to keep it from getting very high or very low.  Writing the results in a diary or log helps the doctor know how to care for you.  Your blood sugar log should have the time, date and the results.  Also, write down the amount of insulin or other medicine that you take.  Other information, like what you ate, exercise done  and how you were feeling, will also be helpful.     Notes:   11/8 - Reminded to monitor multiple times a day with insulin changes. Patient checks sugars regularly- patient reports sugars tend to be high in the am.    12/9 - Reviewed insulin changes as well as appointment with nutritionist  09/24/20 Patient sugars remain in the 300 range last A1c 8.4.  Encouraged patient to really watch his diet and limit sweets and carbohydrate containing food. 12/17/20 A1c up to 8.9 encouraged better sugar control.  Patient remains active through walking.   Diabetes Management Discussed:  Medication adherence Reviewed importance of limiting carbs such as rice, potatoes, breads, and pastas. Also discussed limiting sweets and sugary drinks.  Discussed importance of portion control.  Also discussed importance of annual exams such as eye exam.   [id number] A1C 7.6 [id number] Patient reminded the importance of monitoring blood sugars as per ordered [id number] Patient is not checking blood sugars on a regular basis. Patient encouraged him to monitor blood sugars.     [facility] - Set My Target A1C   Not on track    Timeframe:  Long-Range Goal Priority:  Medium Start Date:  [id number]                           Expected End Date:      [id number] Follow Up Date [id number]   - set target A1C - Less than 8   Why is this important?   Your target A1C is decided together by you and your doctor.  It is based on several things like your age and other health issues.    Notes:   11/8 - decreased to 9.3 on 04/22/20 [id number]  A1C 7.6   12/9 - decreased to 8.9 in December 2021  08/26/20- A1c 8.4 08-11-20  09/24/20 patient continues to work at lowering A1c.  [id number] Patient A1C has decreased to 7.3 from 8.9 [id number] A1C has increased from 7.3 to 7.6        Plan:  Follow-up: Follow-up in 3 month(s) RN discussed monitoring blood sugars Patient will follow up with flu shot Patient will send update assessment Patient will follow  up with Dr office for appointment to ENT  [place] [facility] [phone]

## 2021-04-13 ENCOUNTER — Other Ambulatory Visit: Payer: Self-pay | Admitting: Nurse Practitioner

## 2021-04-13 ENCOUNTER — Other Ambulatory Visit: Payer: Self-pay | Admitting: Cardiovascular Disease

## 2021-04-13 DIAGNOSIS — M5441 Lumbago with sciatica, right side: Secondary | ICD-10-CM

## 2021-04-16 ENCOUNTER — Encounter (INDEPENDENT_AMBULATORY_CARE_PROVIDER_SITE_OTHER): Payer: Self-pay

## 2021-04-16 ENCOUNTER — Encounter (INDEPENDENT_AMBULATORY_CARE_PROVIDER_SITE_OTHER): Payer: Medicare Other | Admitting: Ophthalmology

## 2021-04-16 DIAGNOSIS — H35033 Hypertensive retinopathy, bilateral: Secondary | ICD-10-CM

## 2021-04-16 DIAGNOSIS — H3581 Retinal edema: Secondary | ICD-10-CM

## 2021-04-16 DIAGNOSIS — I10 Essential (primary) hypertension: Secondary | ICD-10-CM

## 2021-04-16 DIAGNOSIS — H25813 Combined forms of age-related cataract, bilateral: Secondary | ICD-10-CM

## 2021-04-16 DIAGNOSIS — E113413 Type 2 diabetes mellitus with severe nonproliferative diabetic retinopathy with macular edema, bilateral: Secondary | ICD-10-CM

## 2021-04-20 DIAGNOSIS — I110 Hypertensive heart disease with heart failure: Secondary | ICD-10-CM | POA: Diagnosis not present

## 2021-04-20 DIAGNOSIS — I480 Paroxysmal atrial fibrillation: Secondary | ICD-10-CM

## 2021-04-20 DIAGNOSIS — M5441 Lumbago with sciatica, right side: Secondary | ICD-10-CM | POA: Diagnosis not present

## 2021-04-20 DIAGNOSIS — E1169 Type 2 diabetes mellitus with other specified complication: Secondary | ICD-10-CM

## 2021-04-20 DIAGNOSIS — T383X5A Adverse effect of insulin and oral hypoglycemic [antidiabetic] drugs, initial encounter: Secondary | ICD-10-CM | POA: Diagnosis not present

## 2021-04-20 DIAGNOSIS — E1151 Type 2 diabetes mellitus with diabetic peripheral angiopathy without gangrene: Secondary | ICD-10-CM

## 2021-04-20 DIAGNOSIS — E278 Other specified disorders of adrenal gland: Secondary | ICD-10-CM | POA: Diagnosis not present

## 2021-04-20 DIAGNOSIS — E160 Drug-induced hypoglycemia without coma: Secondary | ICD-10-CM | POA: Diagnosis not present

## 2021-04-20 DIAGNOSIS — I5042 Chronic combined systolic (congestive) and diastolic (congestive) heart failure: Secondary | ICD-10-CM | POA: Diagnosis not present

## 2021-04-20 DIAGNOSIS — I739 Peripheral vascular disease, unspecified: Secondary | ICD-10-CM | POA: Diagnosis not present

## 2021-04-20 DIAGNOSIS — M79671 Pain in right foot: Secondary | ICD-10-CM | POA: Diagnosis not present

## 2021-04-20 DIAGNOSIS — E1142 Type 2 diabetes mellitus with diabetic polyneuropathy: Secondary | ICD-10-CM

## 2021-04-20 DIAGNOSIS — G3184 Mild cognitive impairment, so stated: Secondary | ICD-10-CM | POA: Diagnosis not present

## 2021-04-26 ENCOUNTER — Encounter (HOSPITAL_COMMUNITY): Payer: Medicare Other | Admitting: Internal Medicine

## 2021-04-28 DIAGNOSIS — E160 Drug-induced hypoglycemia without coma: Secondary | ICD-10-CM | POA: Diagnosis not present

## 2021-04-28 DIAGNOSIS — M5441 Lumbago with sciatica, right side: Secondary | ICD-10-CM | POA: Diagnosis not present

## 2021-04-28 DIAGNOSIS — I739 Peripheral vascular disease, unspecified: Secondary | ICD-10-CM | POA: Diagnosis not present

## 2021-04-28 DIAGNOSIS — T383X5A Adverse effect of insulin and oral hypoglycemic [antidiabetic] drugs, initial encounter: Secondary | ICD-10-CM | POA: Diagnosis not present

## 2021-04-28 DIAGNOSIS — M79671 Pain in right foot: Secondary | ICD-10-CM | POA: Diagnosis not present

## 2021-04-28 DIAGNOSIS — E278 Other specified disorders of adrenal gland: Secondary | ICD-10-CM | POA: Diagnosis not present

## 2021-04-29 ENCOUNTER — Telehealth (HOSPITAL_COMMUNITY): Payer: Self-pay

## 2021-04-29 NOTE — Telephone Encounter (Signed)
Patient is now discharged from [facility].  Patient has/has not met the following goals:  Yes :Patient expresses basic understanding of medications and what they are for Yes :Patient able to verbalize heart failure specific dietary/fluid restrictions Yes :Patient is aware of who to call if they have medical concerns or if they need to schedule or change appts Yes :Patient has a scale for daily weights and weighs regularly Yes :Patient able to verbalize concerning symptoms when they should call the HF clinic (weight gain ranges, etc) Yes :Patient has a PCP and has seen within the past year or has upcoming appt Yes :Patient has reliable access to getting their medications Yes :Patient has shown they are able to reorder medications reliably No :Patient has had admission in past 30 days- if yes how many? No :Patient has had admission in past 90 days- if yes how many?  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 He is successful with his meds with the help of [facility] packs and home delivery.  I have advised him to contact me should anything change.   Marylouise Stacks, EMT-Paramedicine  04/29/21

## 2021-04-30 DIAGNOSIS — T383X5A Adverse effect of insulin and oral hypoglycemic [antidiabetic] drugs, initial encounter: Secondary | ICD-10-CM | POA: Diagnosis not present

## 2021-04-30 DIAGNOSIS — E160 Drug-induced hypoglycemia without coma: Secondary | ICD-10-CM | POA: Diagnosis not present

## 2021-04-30 DIAGNOSIS — E278 Other specified disorders of adrenal gland: Secondary | ICD-10-CM | POA: Diagnosis not present

## 2021-04-30 DIAGNOSIS — I739 Peripheral vascular disease, unspecified: Secondary | ICD-10-CM | POA: Diagnosis not present

## 2021-04-30 DIAGNOSIS — M5441 Lumbago with sciatica, right side: Secondary | ICD-10-CM | POA: Diagnosis not present

## 2021-04-30 DIAGNOSIS — M79671 Pain in right foot: Secondary | ICD-10-CM | POA: Diagnosis not present

## 2021-05-03 DIAGNOSIS — E160 Drug-induced hypoglycemia without coma: Secondary | ICD-10-CM | POA: Diagnosis not present

## 2021-05-03 DIAGNOSIS — T383X5A Adverse effect of insulin and oral hypoglycemic [antidiabetic] drugs, initial encounter: Secondary | ICD-10-CM | POA: Diagnosis not present

## 2021-05-03 DIAGNOSIS — M5441 Lumbago with sciatica, right side: Secondary | ICD-10-CM | POA: Diagnosis not present

## 2021-05-03 DIAGNOSIS — M79671 Pain in right foot: Secondary | ICD-10-CM | POA: Diagnosis not present

## 2021-05-03 DIAGNOSIS — I739 Peripheral vascular disease, unspecified: Secondary | ICD-10-CM | POA: Diagnosis not present

## 2021-05-03 DIAGNOSIS — E278 Other specified disorders of adrenal gland: Secondary | ICD-10-CM | POA: Diagnosis not present

## 2021-05-06 DIAGNOSIS — G4700 Insomnia, unspecified: Secondary | ICD-10-CM | POA: Diagnosis not present

## 2021-05-06 DIAGNOSIS — I4891 Unspecified atrial fibrillation: Secondary | ICD-10-CM | POA: Diagnosis not present

## 2021-05-06 DIAGNOSIS — R059 Cough, unspecified: Secondary | ICD-10-CM | POA: Diagnosis not present

## 2021-05-06 DIAGNOSIS — I480 Paroxysmal atrial fibrillation: Secondary | ICD-10-CM | POA: Diagnosis not present

## 2021-05-06 DIAGNOSIS — E1151 Type 2 diabetes mellitus with diabetic peripheral angiopathy without gangrene: Secondary | ICD-10-CM | POA: Diagnosis not present

## 2021-05-06 DIAGNOSIS — D6869 Other thrombophilia: Secondary | ICD-10-CM | POA: Diagnosis not present

## 2021-05-06 DIAGNOSIS — G3184 Mild cognitive impairment, so stated: Secondary | ICD-10-CM | POA: Diagnosis not present

## 2021-05-06 DIAGNOSIS — E785 Hyperlipidemia, unspecified: Secondary | ICD-10-CM | POA: Diagnosis not present

## 2021-05-06 DIAGNOSIS — I110 Hypertensive heart disease with heart failure: Secondary | ICD-10-CM | POA: Diagnosis not present

## 2021-05-06 DIAGNOSIS — I5042 Chronic combined systolic (congestive) and diastolic (congestive) heart failure: Secondary | ICD-10-CM | POA: Diagnosis not present

## 2021-05-06 DIAGNOSIS — Z79899 Other long term (current) drug therapy: Secondary | ICD-10-CM | POA: Diagnosis not present

## 2021-05-06 DIAGNOSIS — M5441 Lumbago with sciatica, right side: Secondary | ICD-10-CM | POA: Diagnosis not present

## 2021-05-06 DIAGNOSIS — I25118 Atherosclerotic heart disease of native coronary artery with other forms of angina pectoris: Secondary | ICD-10-CM | POA: Diagnosis not present

## 2021-05-06 DIAGNOSIS — E1140 Type 2 diabetes mellitus with diabetic neuropathy, unspecified: Secondary | ICD-10-CM | POA: Diagnosis not present

## 2021-05-06 DIAGNOSIS — I5022 Chronic systolic (congestive) heart failure: Secondary | ICD-10-CM | POA: Diagnosis not present

## 2021-05-06 DIAGNOSIS — G4733 Obstructive sleep apnea (adult) (pediatric): Secondary | ICD-10-CM | POA: Diagnosis not present

## 2021-05-12 ENCOUNTER — Other Ambulatory Visit: Payer: Self-pay

## 2021-05-12 ENCOUNTER — Ambulatory Visit (HOSPITAL_BASED_OUTPATIENT_CLINIC_OR_DEPARTMENT_OTHER)
Admission: RE | Admit: 2021-05-12 | Discharge: 2021-05-12 | Disposition: A | Discharge status: Home / Self Care | Payer: Medicare Other | Source: Ambulatory Visit | Attending: Internal Medicine | Admitting: Internal Medicine

## 2021-05-12 ENCOUNTER — Ambulatory Visit (HOSPITAL_COMMUNITY)
Admission: RE | Admit: 2021-05-12 | Discharge: 2021-05-12 | Disposition: A | Discharge status: Home / Self Care | Payer: Medicare Other | Source: Ambulatory Visit | Attending: Internal Medicine | Admitting: Internal Medicine

## 2021-05-12 ENCOUNTER — Emergency Department (HOSPITAL_COMMUNITY): Payer: Medicare Other

## 2021-05-12 ENCOUNTER — Encounter (HOSPITAL_COMMUNITY): Payer: Self-pay | Admitting: Internal Medicine

## 2021-05-12 ENCOUNTER — Encounter (HOSPITAL_COMMUNITY): Payer: Self-pay | Admitting: Radiology

## 2021-05-12 ENCOUNTER — Emergency Department (HOSPITAL_COMMUNITY)
Admission: EM | Admit: 2021-05-12 | Discharge: 2021-05-12 | Disposition: A | Discharge status: Home / Self Care | Payer: Medicare Other | Attending: Emergency Medicine | Admitting: Emergency Medicine

## 2021-05-12 VITALS — BP 92/66 | HR 99 | Wt 184.6 lb

## 2021-05-12 DIAGNOSIS — Z041 Encounter for examination and observation following transport accident: Secondary | ICD-10-CM | POA: Diagnosis not present

## 2021-05-12 DIAGNOSIS — I110 Hypertensive heart disease with heart failure: Secondary | ICD-10-CM | POA: Insufficient documentation

## 2021-05-12 DIAGNOSIS — Z79899 Other long term (current) drug therapy: Secondary | ICD-10-CM | POA: Insufficient documentation

## 2021-05-12 DIAGNOSIS — R0609 Other forms of dyspnea: Secondary | ICD-10-CM | POA: Insufficient documentation

## 2021-05-12 DIAGNOSIS — S3991XA Unspecified injury of abdomen, initial encounter: Secondary | ICD-10-CM | POA: Diagnosis not present

## 2021-05-12 DIAGNOSIS — Z09 Encounter for follow-up examination after completed treatment for conditions other than malignant neoplasm: Secondary | ICD-10-CM | POA: Insufficient documentation

## 2021-05-12 DIAGNOSIS — K449 Diaphragmatic hernia without obstruction or gangrene: Secondary | ICD-10-CM | POA: Diagnosis not present

## 2021-05-12 DIAGNOSIS — T1490XA Injury, unspecified, initial encounter: Secondary | ICD-10-CM

## 2021-05-12 DIAGNOSIS — Y9301 Activity, walking, marching and hiking: Secondary | ICD-10-CM | POA: Diagnosis not present

## 2021-05-12 DIAGNOSIS — I10 Essential (primary) hypertension: Secondary | ICD-10-CM | POA: Diagnosis not present

## 2021-05-12 DIAGNOSIS — R0602 Shortness of breath: Secondary | ICD-10-CM

## 2021-05-12 DIAGNOSIS — S8991XA Unspecified injury of right lower leg, initial encounter: Secondary | ICD-10-CM | POA: Insufficient documentation

## 2021-05-12 DIAGNOSIS — R140 Abdominal distension (gaseous): Secondary | ICD-10-CM | POA: Insufficient documentation

## 2021-05-12 DIAGNOSIS — S3992XA Unspecified injury of lower back, initial encounter: Secondary | ICD-10-CM | POA: Insufficient documentation

## 2021-05-12 DIAGNOSIS — Z8616 Personal history of COVID-19: Secondary | ICD-10-CM | POA: Insufficient documentation

## 2021-05-12 DIAGNOSIS — Z794 Long term (current) use of insulin: Secondary | ICD-10-CM | POA: Insufficient documentation

## 2021-05-12 DIAGNOSIS — E1121 Type 2 diabetes mellitus with diabetic nephropathy: Secondary | ICD-10-CM | POA: Diagnosis not present

## 2021-05-12 DIAGNOSIS — S0990XA Unspecified injury of head, initial encounter: Secondary | ICD-10-CM | POA: Diagnosis not present

## 2021-05-12 DIAGNOSIS — S299XXA Unspecified injury of thorax, initial encounter: Secondary | ICD-10-CM | POA: Insufficient documentation

## 2021-05-12 DIAGNOSIS — I252 Old myocardial infarction: Secondary | ICD-10-CM | POA: Insufficient documentation

## 2021-05-12 DIAGNOSIS — E1136 Type 2 diabetes mellitus with diabetic cataract: Secondary | ICD-10-CM | POA: Insufficient documentation

## 2021-05-12 DIAGNOSIS — I428 Other cardiomyopathies: Secondary | ICD-10-CM | POA: Insufficient documentation

## 2021-05-12 DIAGNOSIS — I5042 Chronic combined systolic (congestive) and diastolic (congestive) heart failure: Secondary | ICD-10-CM

## 2021-05-12 DIAGNOSIS — Y92410 Unspecified street and highway as the place of occurrence of the external cause: Secondary | ICD-10-CM | POA: Diagnosis not present

## 2021-05-12 DIAGNOSIS — S4991XA Unspecified injury of right shoulder and upper arm, initial encounter: Secondary | ICD-10-CM | POA: Insufficient documentation

## 2021-05-12 DIAGNOSIS — I2510 Atherosclerotic heart disease of native coronary artery without angina pectoris: Secondary | ICD-10-CM | POA: Insufficient documentation

## 2021-05-12 DIAGNOSIS — R059 Cough, unspecified: Secondary | ICD-10-CM | POA: Insufficient documentation

## 2021-05-12 DIAGNOSIS — Z7901 Long term (current) use of anticoagulants: Secondary | ICD-10-CM | POA: Insufficient documentation

## 2021-05-12 DIAGNOSIS — K219 Gastro-esophageal reflux disease without esophagitis: Secondary | ICD-10-CM | POA: Insufficient documentation

## 2021-05-12 DIAGNOSIS — M542 Cervicalgia: Secondary | ICD-10-CM | POA: Diagnosis not present

## 2021-05-12 DIAGNOSIS — E785 Hyperlipidemia, unspecified: Secondary | ICD-10-CM | POA: Insufficient documentation

## 2021-05-12 DIAGNOSIS — Z20822 Contact with and (suspected) exposure to covid-19: Secondary | ICD-10-CM | POA: Insufficient documentation

## 2021-05-12 DIAGNOSIS — Z743 Need for continuous supervision: Secondary | ICD-10-CM | POA: Diagnosis not present

## 2021-05-12 DIAGNOSIS — I480 Paroxysmal atrial fibrillation: Secondary | ICD-10-CM

## 2021-05-12 DIAGNOSIS — M545 Low back pain, unspecified: Secondary | ICD-10-CM | POA: Diagnosis not present

## 2021-05-12 DIAGNOSIS — R519 Headache, unspecified: Secondary | ICD-10-CM | POA: Diagnosis not present

## 2021-05-12 DIAGNOSIS — R42 Dizziness and giddiness: Secondary | ICD-10-CM | POA: Insufficient documentation

## 2021-05-12 DIAGNOSIS — I6523 Occlusion and stenosis of bilateral carotid arteries: Secondary | ICD-10-CM | POA: Diagnosis not present

## 2021-05-12 DIAGNOSIS — I700 Atherosclerosis of aorta: Secondary | ICD-10-CM | POA: Diagnosis not present

## 2021-05-12 DIAGNOSIS — I5022 Chronic systolic (congestive) heart failure: Secondary | ICD-10-CM | POA: Insufficient documentation

## 2021-05-12 DIAGNOSIS — M546 Pain in thoracic spine: Secondary | ICD-10-CM | POA: Diagnosis not present

## 2021-05-12 DIAGNOSIS — R52 Pain, unspecified: Secondary | ICD-10-CM | POA: Diagnosis not present

## 2021-05-12 LAB — CBC
HCT: 49.5 % (ref 39.0–52.0)
Hemoglobin: 15.9 g/dL (ref 13.0–17.0)
MCH: 29.2 pg (ref 26.0–34.0)
MCHC: 32.1 g/dL (ref 30.0–36.0)
MCV: 90.8 fL (ref 80.0–100.0)
Platelets: 265 10*3/uL (ref 150–400)
RBC: 5.45 MIL/uL (ref 4.22–5.81)
RDW: 12.6 % (ref 11.5–15.5)
WBC: 8.6 10*3/uL (ref 4.0–10.5)
nRBC: 0 % (ref 0.0–0.2)

## 2021-05-12 LAB — I-STAT CHEM 8, ED
BUN: 25 mg/dL — ABNORMAL HIGH (ref 8–23)
Calcium, Ion: 1.12 mmol/L — ABNORMAL LOW (ref 1.15–1.40)
Chloride: 104 mmol/L (ref 98–111)
Creatinine, Ser: 1.7 mg/dL — ABNORMAL HIGH (ref 0.61–1.24)
Glucose, Bld: 87 mg/dL (ref 70–99)
HCT: 51 % (ref 39.0–52.0)
Hemoglobin: 17.3 g/dL — ABNORMAL HIGH (ref 13.0–17.0)
Potassium: 3.7 mmol/L (ref 3.5–5.1)
Sodium: 140 mmol/L (ref 135–145)
TCO2: 26 mmol/L (ref 22–32)

## 2021-05-12 LAB — COMPREHENSIVE METABOLIC PANEL
ALT: 23 U/L (ref 0–44)
ALT: 22 U/L (ref 0–44)
AST: 24 U/L (ref 15–41)
AST: 25 U/L (ref 15–41)
Albumin: 3.7 g/dL (ref 3.5–5.0)
Albumin: 3.9 g/dL (ref 3.5–5.0)
Alkaline Phosphatase: 82 U/L (ref 38–126)
Alkaline Phosphatase: 82 U/L (ref 38–126)
Anion gap: 9 (ref 5–15)
Anion gap: 10 (ref 5–15)
BUN: 23 mg/dL (ref 8–23)
BUN: 22 mg/dL (ref 8–23)
CO2: 26 mmol/L (ref 22–32)
CO2: 24 mmol/L (ref 22–32)
Calcium: 9.1 mg/dL (ref 8.9–10.3)
Calcium: 9.2 mg/dL (ref 8.9–10.3)
Chloride: 102 mmol/L (ref 98–111)
Chloride: 102 mmol/L (ref 98–111)
Creatinine, Ser: 1.59 mg/dL — ABNORMAL HIGH (ref 0.61–1.24)
Creatinine, Ser: 1.6 mg/dL — ABNORMAL HIGH (ref 0.61–1.24)
GFR, Estimated: 49 mL/min — ABNORMAL LOW (ref >60)
GFR, Estimated: 49 mL/min — ABNORMAL LOW (ref >60)
Glucose, Bld: 127 mg/dL — ABNORMAL HIGH (ref 70–99)
Glucose, Bld: 86 mg/dL (ref 70–99)
Potassium: 4 mmol/L (ref 3.5–5.1)
Potassium: 3.8 mmol/L (ref 3.5–5.1)
Sodium: 137 mmol/L (ref 135–145)
Sodium: 136 mmol/L (ref 135–145)
Total Bilirubin: 0.6 mg/dL (ref 0.3–1.2)
Total Bilirubin: 0.7 mg/dL (ref 0.3–1.2)
Total Protein: 7.2 g/dL (ref 6.5–8.1)
Total Protein: 7.3 g/dL (ref 6.5–8.1)

## 2021-05-12 LAB — URINALYSIS, ROUTINE W REFLEX MICROSCOPIC
Bacteria, UA: NONE SEEN
Bilirubin Urine: NEGATIVE
Glucose, UA: >=500 mg/dL — AB
Ketones, ur: NEGATIVE mg/dL
Leukocytes,Ua: NEGATIVE
Nitrite: NEGATIVE
Protein, ur: 30 mg/dL — AB
Specific Gravity, Urine: 1.015 (ref 1.005–1.030)
pH: 5 (ref 5.0–8.0)

## 2021-05-12 LAB — ETHANOL: Alcohol, Ethyl (B): <10 mg/dL (ref <10)

## 2021-05-12 LAB — LACTIC ACID, PLASMA: Lactic Acid, Venous: 1.4 mmol/L (ref 0.5–1.9)

## 2021-05-12 LAB — SAMPLE TO BLOOD BANK

## 2021-05-12 LAB — RESP PANEL BY RT-PCR (FLU A&B, COVID) ARPGX2
Influenza A by PCR: NEGATIVE
Influenza B by PCR: NEGATIVE
SARS Coronavirus 2 by RT PCR: NEGATIVE

## 2021-05-12 LAB — PROTIME-INR
INR: 1.1 (ref 0.8–1.2)
Prothrombin Time: 14.1 seconds (ref 11.4–15.2)

## 2021-05-12 LAB — BRAIN NATRIURETIC PEPTIDE: B Natriuretic Peptide: 39.7 pg/mL (ref 0.0–100.0)

## 2021-05-12 MED ORDER — IOHEXOL 350 MG/ML SOLN
80.0000 mL | Freq: Once | INTRAVENOUS | Status: AC | PRN
  Administered 2021-05-12: 80 mL via INTRAVENOUS

## 2021-05-12 MED ORDER — FENTANYL CITRATE PF 50 MCG/ML IJ SOSY
50.0000 ug | PREFILLED_SYRINGE | INTRAMUSCULAR | Status: DC | PRN
  Administered 2021-05-12: 50 ug via INTRAVENOUS
  Filled 2021-05-12: qty 1
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
## 2021-05-12 NOTE — Progress Notes (Signed)
ReDS Vest / Clip - 05/12/21 1400       ReDS Vest / Clip   Station Marker C    Ruler Value 32.5    ReDS Value Range Low volume    ReDS Actual Value 28

## 2021-05-12 NOTE — Progress Notes (Addendum)
ADVANCED HF CLINIC NOTE  Referring Physician: Primary Care: Dr [name]  Primary Cardiologist: Dr Angelena Form  Neurology: Dr Delice Lesch  [facility]: Dr. Haroldine Laws   HPI Calvin [name] is a 60 yo male with history of chronic systolic heart failure due to NICM, uncontrolled DMII, GERD, HTN, MI 2015, memory issues, and hyperlipidemia. Normal sleep study in 2020.  Admitted 08/2017 with CP. Underwent LHC/RHC with mild nonobstructive CAD & EF 35-40%.  Readmitted in 10/2017 with chest pain. CTA was negative for PE. He was also admitted in 03/2018 with chest pain. He had a brief episode of A fib and was started on eliquis and carvedilol was increased to 6.25 mg twice a day.  Back in 2020 he was on Entresto but this was later stopped due to dizziness. Placed back on lisinopril.   Had sleep study 07/2018 that was normal.   Repeat 2D Echo 01/2019 that showed EF 35-40%, mild LVH, normal RV size and function. No significant valvular dysfunction.   06/2019 Admitted for Covid 19 infection. Was placed on BiPAP but did not require intubation.  Seen in HF clinic 3/21 for first time and was very depressed/stressed out and was having a lot of fatigue and SOB. R/L cath in 3/21 with non-obstructive CAD (LAD 17m 60d, LCX 40% diffuse, RCA 30p, 537m RHC well compensated PCWP 8 CI 4.3  Seen in ED 06/08/20 for CP for 4 days and rash. CXR without infiltrates or pulmonary edema, hs-Tn 13-->12, labs unremarkable except elevated creatinine 1.42, EKG stable. CP symptoms resolved and thought to be stress-related.   Echo  02/08/21 EF 30-35% RV mild HK Personally reviewed  CPX testing 08/22 with severe HF limitation.  BP rest: 110/64 BP peak: 114/60  Peak VO2: 13.3 (52% predicted peak VO2)  VE/VCO2 slope:  57  OUES: 0.95  Peak RER: 1.07  Ventilatory Threshold: 10.3 (40% predicted or measured peak VO2)  Peak RR 80  Peak Ventilation:  65.3  VE/MVV:  60%  PETCO2 at peak:  26  O2pulse:  10   (75% predicted O2pulse)   cMRI 03/17/21  with LVEF 45%, no LGE  Seen for f/u 04/01/21. Reported worsening dyspnea with exertion. Volume appeared okay. Coreg decreased and started on digoxin.   R/LHC 04/07/21 mild nonobstructive CAD, LVEF recovered to 55-60% by visual estimate, normal hemodynamics  He is here today for f/u. Weight up 6 lb from last visit. Reports ongoing dyspnea + orthopnea and dry cough for at least 3 weeks. Notes acid reflux when he lies down. Takes protonix. Abdomen feels distended and feels full after just a few bites. Walked about a mile to clinic today. Notes some dizziness when up ambulating at times. This has been intermittent for some time. No syncope.   Has been taking medications as prescribed.  Has never been a smoker.  Studies cMRI 09/22 EF 45%, no LGE, RV okay, breathhold artifact noted  Echo 08/22 EF 30-35%, RV mildly reduced, trivial Calvin Echo 09/27/19 EF 40-45% RV ok.  ECHO 12/2017  EF 35-40% Grade II DD. Moderate global reduction in LV systolic function; moderate   diastolic dysfunction. ECHO 09/2017  EF 20-25% Grade IDD  R/LHC 10/22: Findings: Ao = 144/69  LV = 144/9 RA = 3 RV = 30/6 PA = 26/9 (16) PCW = 10 Fick cardiac output/index = 5.8/3.1 SVR = 1370 PVR =  1.0 WU Ao sat = 95% PA sat = 73%, 74% SVC sat = 79%   Mid LAD lesion is 40% stenosed.   Prox  Cx to Dist Cx lesion is 40% stenosed.   Dist RCA lesion is 30% stenosed.   Prox RCA to Mid RCA lesion is 40% stenosed.   The left ventricular ejection fraction is 55-65% by visual estimate.     R/L cath in 3/21: with non-obstructive CAD (LAD 22m 60d, LCX 40% diffuse, RCA 30p, 578m RHC well compensated PCWP 8 CI 4.3  LHC/RHC 08/2017  RA 5  PA 22/11 (15)  PCWP 9  CO 6 CI 3/14  Prox RCA to Mid RCA lesion is 30% stenosed. Mid RCA to Dist RCA lesion is 30% stenosed. Prox Cx to Dist Cx lesion is 20% stenosed. Mid LAD lesion is 30% stenosed. There is mild to moderate left ventricular systolic dysfunction. LV end diastolic  pressure is mildly elevated. The left ventricular ejection fraction is 35-45% by visual estimate. There is no mitral valve regurgitation.    SH: Disabled for 6 years due to diabetes. He does not smoke. Denies cocaine abuse.  Past Medical History:  Diagnosis Date   Adenoidal hypertrophy    Adenomatous colon polyps 2012   [place]   Arthritis    Cataract    Mixed OU   COVID-19 virus detected 06/18/2019   Diabetes mellitus    Diabetic retinopathy ([facility]   NPDR OU   GERD (gastroesophageal reflux disease)    [place]   Hx of adenomatous colonic polyps 08/18/2017   Hyperlipidemia    Hypertension    Hypertensive retinopathy    OU   Mild CAD    a. cath in 08/2017 showing mild nonobstructive CAD with scattered 20-30% stenosis.    Nonischemic cardiomyopathy ([facility]   a. EF 20-25% by echo in 09/2017 with cath showing mild CAD. b.  Last echo 12/2017 EF 35-40%, grade 2 DD.   Obesity    PAF (paroxysmal atrial fibrillation) (HCC)    Sleep apnea    cpap, pt says no longer has    Current Outpatient Medications  Medication Sig Dispense Refill   albuterol (VENTOLIN HFA) 108 (90 Base) MCG/ACT inhaler Inhale 2 puffs into the lungs every 6 (six) hours as needed for wheezing or shortness of breath. 6.7 g 0   atorvastatin (LIPITOR) 80 MG tablet TAKE ONE TABLET BY MOUTH EVERYDAY AT BEDTIME 90 tablet 2   bisacodyl (DULCOLAX) 5 MG EC tablet Take 2 tablets (10 mg total) by mouth every other day. Take at bedtime 30 tablet 0   carvedilol (COREG) 3.125 MG tablet Take 1 tablet (3.125 mg total) by mouth 2 (two) times daily with a meal. 60 tablet 3   dapagliflozin propanediol (FARXIGA) 10 MG TABS tablet Take 1 tablet (10 mg total) by mouth daily before breakfast. 90 tablet 3   digoxin (LANOXIN) 0.125 MG tablet Take 1 tablet (0.125 mg total) by mouth daily. 30 tablet 3   donepezil (ARICEPT) 10 MG tablet Take 1 tablet (10 mg total) by mouth at  bedtime. 30 tablet 11   ELIQUIS 5 MG TABS tablet TAKE ONE TABLET BY MOUTH EVERY MORNING and TAKE ONE TABLET BY MOUTH EVERYDAY AT BEDTIME 60 tablet 3   ezetimibe (ZETIA) 10 MG tablet TAKE ONE TABLET BY MOUTH EVERY MORNING 30 tablet 0   gabapentin (NEURONTIN) 100 MG capsule TAKE ONE CAPSULE BY MOUTH THREE TIMES DAILY 90 capsule 0   insulin degludec (TRESIBA FLEXTOUCH) 200 UNIT/ML FlexTouch Pen Inject 52 Units into the skin daily. 18 mL 3  Insulin Syringe-Needle U-100 (INSULIN SYRINGE .3CC/29GX1/2") 29G X 1/2" 0.3 ML MISC Inject 1 Syringe 3 (three) times daily as directed. Check blood sugar three times daily. Dx: E11.9 (Patient taking differently: Inject 1 Syringe as directed 3 (three) times daily. Check blood sugar three times daily. Dx: E11.9) 100 each 3   Lancets (ONETOUCH DELICA PLUS [id number]) MISC USE TWICE DAILY 200 each 3   nitroGLYCERIN (NITROSTAT) 0.4 MG SL tablet Place 1 tablet (0.4 mg total) under the tongue every 5 (five) minutes as needed for chest pain. 30 tablet 1   ONETOUCH ULTRA test strip USE TWICE DAILY 200 strip 3   pantoprazole (PROTONIX) 40 MG tablet Take 1 tablet (40 mg total) by mouth daily. 30 tablet 3   polyethylene glycol (MIRALAX / GLYCOLAX) 17 g packet Take 17 g by mouth 2 (two) times daily. 14 each 0   REPATHA 140 MG/ML SOSY INJECT 1 PEN INTO THE SKIN EVERY 14 DAYS 6 mL 3   sacubitril-valsartan (ENTRESTO) 24-26 MG Take 1 tablet by mouth 2 (two) times daily. 60 tablet 11   spironolactone (ALDACTONE) 25 MG tablet Take 1 tablet (25 mg total) by mouth daily. 90 tablet 3   No current facility-administered medications for this encounter.    No Known Allergies    Social History   Socioeconomic History   Marital status: Single    Spouse name: Not on file   Number of children: 0   Years of education: Not on file   Highest education level: Not on file  Occupational History   Occupation: Disability  Tobacco Use   Smoking status: Never   Smokeless tobacco: Never   Vaping Use   Vaping Use: Never used  Substance and Sexual Activity   Alcohol use: No   Drug use: No   Sexual activity: Yes    Birth control/protection: None  Other Topics Concern   Not on file  Social History Narrative   Social History   Diet?    Do you drink/eat things with caffeine? yes   Marital status?       single      Do you live in a house, apartment, assisted living, condo, trailer, etc.? yes   Is it one or more stories? One story   How many persons live in your home?   Do you have any pets in your home? (please list)    Highest level of education completed? graduate   Do you exercise?            no                          Type & how often?   Advanced Directives   Do you have a living will? no   Do you have a DNR form?                                  If not, do you want to discuss one? no   Do you have signed POA/HPOA for forms? no      Functional Status   Do you have difficulty bathing or dressing yourself? no   Do you have difficulty preparing food or eating? no   Do you have difficulty managing your medications? no   Do you have difficulty managing your finances? no   Do you have difficulty affording your medications? no   Social Determinants of  Health   Financial Resource Strain: Not on file  Food Insecurity: No Food Insecurity   Worried About [place] in the Last Year: Never true   Ran Out of Food in the Last Year: Never true  Transportation Needs: No Transportation Needs   Lack of Transportation (Medical): No   Lack of Transportation (Non-Medical): No  Physical Activity: Not on file  Stress: Not on file  Social Connections: Not on file  Intimate Partner Violence: Not on file      Family History  Problem Relation Age of Onset   Heart disease Mother    Heart attack Mother 93   Hypertension Mother    Hyperlipidemia Mother    Diabetes Mother    Heart disease Father    Heart attack Father 92   Hypertension Father    Hyperlipidemia  Father    Diabetes Father    Heart attack Sister 87   Colon cancer Neg Hx    Stomach cancer Neg Hx    Esophageal cancer Neg Hx    Rectal cancer Neg Hx    Colon polyps Neg Hx     Vitals:   05/12/21 1409  BP: 92/66  Pulse: 99  SpO2: 97%  Weight: 83.7 kg      Wt Readings from Last 3 Encounters:  05/12/21 83.7 kg  04/07/21 80.7 kg  04/06/21 80.7 kg     PHYSICAL EXAM: General:  Well appearing AAM. No resp difficulty HEENT: normal Neck: supple. no JVD. Carotids 2+ bilat; no bruits. No lymphadenopathy or thryomegaly appreciated. Cor: PMI nondisplaced. Regular rate & rhythm. No rubs, gallops or murmurs. Lungs: clear Abdomen: soft, nontender, nondistended. No hepatosplenomegaly. No bruits or masses. Good bowel sounds. Extremities: no cyanosis, clubbing, rash, edema Neuro: alert & orientedx3, cranial nerves grossly intact. moves all 4 extremities w/o difficulty. Affect pleasant  ECG: SR 96 bpm  ReDS: 28%  ASSESSMENT & PLAN: 1. Chronic Systolic /Diastolic Heart Failure with recovered LV function, NICM - ECHO 12/2017 EF 35-40%. LHC 2/219 with nonobstructive CAD.  - ECHO 01/2019, EF 35-40% - Echo 09/27/19 EF 40-45% RV ok  - R/L cath in 3/21 with non-obstructive CAD (LAD 9m 60d, LCX 40% diffuse, RCA 30p, 581m RHC well compensated PCWP 8 CI 4.3 - Echo 02/08/21 EF 30-35% RV mild HK Personally reviewed - CPX 08/22 severe HF limitation with blunted BP response, elevated VEDX/IP38lope - cMRI 09/22 LVEF 45%, no LGE - R/[facility]0/22 LVEF 55-60% visually, mild CAD, normal hemodynamics - NYHA IIIa. Continues to have shortness of breath with exertion. + dry cough.  ReDS 28%. No JVD. His EF has recovered and had normal filling pressures on recent RHC. May add low dose lasix if BNP elevated. Chest x-ray today. Never smoker. ? If some of symptoms may be due to reflux. Continue protonix. No anemia. Thyroid fxn has historically been stable. - Continue Farxiga 10 mg daily.  - Continue  spironolactone 25 mg daily.  - Continue coreg 3.125 mg BID - Stop digoxin d/t recovery in LV function - Stop Entresto with low BP + dizziness - BNP and CMET today  2. HTN - BP low - Med changes as above  3. Type 2 diabetes mellitus - hgb A1C 7.6 08/22 - Management per Dr. ElLoanne Drilling - Continue Farxiga.  4. CAD:  - mild nonobstructive CAD by cath 10/22 - no ASA w/ Eliquis.  - continue ? blocker. - on statin, repatha and zetia -> lipids managed by PCP  5. PAF -  Eliquis for a/c.  - Remains in NSR   F/u: APP clinic 11/30   [facility], Lynder Parents, MD 05/12/21 2:52 PM   Patient seen and examined with the above-signed Advanced Practice Provider and/or Housestaff. I personally reviewed laboratory data, imaging studies and relevant notes. I independently examined the patient and formulated the important aspects of the plan. I have edited the note to reflect any of my changes or salient points. I have personally discussed the plan with the patient and/or family.  Recent cath showed mild CAD and normal filling pressures. Continues to have SOB. BP low  General:  Well appearing. No resp difficulty HEENT: normal Neck: supple. JVP hard to see Carotids 2+ bilat; no bruits. No lymphadenopathy or thryomegaly appreciated. Cor: PMI nondisplaced. Regular rate & rhythm. No rubs, gallops or murmurs. Lungs: clear Abdomen: obese soft, nontender, mildly distended. No hepatosplenomegaly. No bruits or masses. Good bowel sounds. Extremities: no cyanosis, clubbing, rash, edema Neuro: alert & orientedx3, cranial nerves grossly intact. moves all 4 extremities w/o difficulty. Affect pleasant  On exam looks mildly volume overloaded but recent cath and ReDS very reassuring. Agree with med changes as above. Suspect his weigh and deconditioning are playing a role. Encouraged exercise regimen and weight loss. Labs today.   Glori Bickers, MD  7:56 PM
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
## 2021-05-12 NOTE — Addendum Note (Signed)
Encounter addended by: [name], MD on: 05/12/2021 7:56 PM  Actions taken: Clinical Note Signed, Level of Service modified

## 2021-05-12 NOTE — Patient Instructions (Addendum)
EKG done today.  RedsClip done today.   Labs done today. We will contact you only if your labs are abnormal.  STOP taking Entresto  STOP taking Digoxin   No other medication changes were made. Please continue all current medications as prescribed.  A chest x-ray takes a picture of the organs and structures inside the chest, including the heart, lungs, and blood vessels. This test can show several things, including, whether the heart is enlarges; whether fluid is building up in the lungs; and whether pacemaker / defibrillator leads are still in place.   Your physician recommends that you schedule a follow-up appointment in: 3-4 weeks with our [facility] here in our office   If you have any questions or concerns before your next appointment please send [place] a message through [place] or call our office at [phone].    TO LEAVE A MESSAGE FOR THE NURSE SELECT OPTION 2, PLEASE LEAVE A MESSAGE INCLUDING: YOUR NAME DATE OF BIRTH CALL BACK NUMBER REASON FOR CALL**this is important as we prioritize the call backs  YOU WILL RECEIVE A CALL BACK THE SAME DAY AS LONG AS YOU CALL BEFORE 4:00 PM   Do the following things EVERYDAY: Weigh yourself in the morning before breakfast. Write it down and keep it in a log. Take your medicines as prescribed Eat low salt foods--Limit salt (sodium) to 2000 mg per day.  Stay as active as you can everyday Limit all fluids for the day to less than 2 liters   At the [facility], you and your health needs are our priority. As part of our continuing mission to provide you with exceptional heart care, we have created designated Provider Care Teams. These Care Teams include your primary Cardiologist (physician) and Advanced Practice Providers (APPs- Physician Assistants and Nurse Practitioners) who all work together to provide you with the care you need, when you need it.   You may see any of the following providers on your designated Care Team  at your next follow up: Dr Glori Bickers Dr Haynes Kerns, NP Lyda Jester, [place] Audry Riles, PharmD   Please be sure to bring in all your medications bottles to every appointment.

## 2021-05-13 ENCOUNTER — Other Ambulatory Visit: Payer: Self-pay | Admitting: Nurse Practitioner

## 2021-05-13 ENCOUNTER — Other Ambulatory Visit: Payer: Self-pay | Admitting: Internal Medicine

## 2021-05-13 DIAGNOSIS — M5441 Lumbago with sciatica, right side: Secondary | ICD-10-CM

## 2021-05-14 ENCOUNTER — Encounter: Payer: Self-pay | Admitting: Nurse Practitioner

## 2021-05-14 ENCOUNTER — Ambulatory Visit (INDEPENDENT_AMBULATORY_CARE_PROVIDER_SITE_OTHER): Payer: Medicare Other | Admitting: Nurse Practitioner

## 2021-05-14 ENCOUNTER — Other Ambulatory Visit: Payer: Self-pay

## 2021-05-14 VITALS — BP 160/90 | HR 93 | Temp 97.1°F | Ht 66.0 in | Wt 184.6 lb

## 2021-05-14 DIAGNOSIS — I10 Essential (primary) hypertension: Secondary | ICD-10-CM

## 2021-05-14 DIAGNOSIS — R151 Fecal smearing: Secondary | ICD-10-CM | POA: Diagnosis not present

## 2021-05-14 DIAGNOSIS — G3184 Mild cognitive impairment, so stated: Secondary | ICD-10-CM

## 2021-05-14 DIAGNOSIS — Z794 Long term (current) use of insulin: Secondary | ICD-10-CM | POA: Diagnosis not present

## 2021-05-14 DIAGNOSIS — I5022 Chronic systolic (congestive) heart failure: Secondary | ICD-10-CM | POA: Diagnosis not present

## 2021-05-14 DIAGNOSIS — K219 Gastro-esophageal reflux disease without esophagitis: Secondary | ICD-10-CM | POA: Diagnosis not present

## 2021-05-14 DIAGNOSIS — M5441 Lumbago with sciatica, right side: Secondary | ICD-10-CM

## 2021-05-14 DIAGNOSIS — E1140 Type 2 diabetes mellitus with diabetic neuropathy, unspecified: Secondary | ICD-10-CM

## 2021-05-14 NOTE — Patient Instructions (Addendum)
Decrease miralax to every other day.  To get benafiber and use daily to help bulk stools  For pain use gabapentin 100 mg by mouth every 8 hours as needed pain.   Call to reschedule colonoscopy   Address: [address], [place], [place] [address] Phone: [phone]   Have nurse take out entreso and digoxin from pill pack
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
## 2021-05-17 ENCOUNTER — Ambulatory Visit: Payer: Medicare Other | Admitting: Nurse Practitioner

## 2021-05-17 ENCOUNTER — Ambulatory Visit (INDEPENDENT_AMBULATORY_CARE_PROVIDER_SITE_OTHER): Payer: Medicare Other | Admitting: Endocrinology

## 2021-05-17 ENCOUNTER — Other Ambulatory Visit: Payer: Self-pay

## 2021-05-17 VITALS — BP 170/80 | HR 74 | Ht 66.0 in | Wt 187.2 lb

## 2021-05-17 DIAGNOSIS — E1140 Type 2 diabetes mellitus with diabetic neuropathy, unspecified: Secondary | ICD-10-CM

## 2021-05-17 DIAGNOSIS — Z794 Long term (current) use of insulin: Secondary | ICD-10-CM

## 2021-05-17 LAB — POCT GLYCOSYLATED HEMOGLOBIN (HGB A1C): Hemoglobin A1C: 7.8 % — AB (ref 4.0–5.6)

## 2021-05-17 NOTE — Patient Instructions (Addendum)
Your blood pressure is high today.  Please see your primary care provider soon, to have it rechecked.  Please continue the same insulin and [place].   On this type of insulin, you should eat meals on a regular schedule.  If a meal is missed or significantly delayed, your blood sugar could go low.   check your blood sugar twice a day.  vary the time of day when you check, between before the 3 meals, and at bedtime.  also check if you have symptoms of your blood sugar being too high or too low.  please keep a record of the readings and bring it to your next appointment here (or you can bring the meter itself).  You can write it on any piece of paper.  please call us sooner if your blood sugar goes below 70, or if you have a lot of readings over 200.   Please come back for a follow-up appointment in 4 months.
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
## 2021-05-18 DIAGNOSIS — I110 Hypertensive heart disease with heart failure: Secondary | ICD-10-CM | POA: Diagnosis not present

## 2021-05-18 DIAGNOSIS — I5042 Chronic combined systolic (congestive) and diastolic (congestive) heart failure: Secondary | ICD-10-CM | POA: Diagnosis not present

## 2021-05-18 DIAGNOSIS — E1151 Type 2 diabetes mellitus with diabetic peripheral angiopathy without gangrene: Secondary | ICD-10-CM | POA: Diagnosis not present

## 2021-05-18 DIAGNOSIS — I480 Paroxysmal atrial fibrillation: Secondary | ICD-10-CM | POA: Diagnosis not present

## 2021-05-18 DIAGNOSIS — M5441 Lumbago with sciatica, right side: Secondary | ICD-10-CM | POA: Diagnosis not present

## 2021-05-18 DIAGNOSIS — G3184 Mild cognitive impairment, so stated: Secondary | ICD-10-CM | POA: Diagnosis not present

## 2021-05-19 ENCOUNTER — Ambulatory Visit: Payer: Medicare Other | Admitting: Nurse Practitioner

## 2021-05-19 DIAGNOSIS — Z1152 Encounter for screening for COVID-19: Secondary | ICD-10-CM | POA: Diagnosis not present

## 2021-05-23 ENCOUNTER — Emergency Department (HOSPITAL_COMMUNITY): Payer: Medicare Other

## 2021-05-23 ENCOUNTER — Other Ambulatory Visit: Payer: Self-pay

## 2021-05-23 ENCOUNTER — Encounter (HOSPITAL_COMMUNITY): Payer: Self-pay | Admitting: Emergency Medicine

## 2021-05-23 ENCOUNTER — Emergency Department (HOSPITAL_COMMUNITY)
Admission: EM | Admit: 2021-05-23 | Discharge: 2021-05-23 | Disposition: A | Discharge status: Home / Self Care | Payer: Medicare Other | Attending: Emergency Medicine | Admitting: Emergency Medicine

## 2021-05-23 DIAGNOSIS — I5022 Chronic systolic (congestive) heart failure: Secondary | ICD-10-CM | POA: Diagnosis not present

## 2021-05-23 DIAGNOSIS — M545 Low back pain, unspecified: Secondary | ICD-10-CM | POA: Insufficient documentation

## 2021-05-23 DIAGNOSIS — E1140 Type 2 diabetes mellitus with diabetic neuropathy, unspecified: Secondary | ICD-10-CM | POA: Insufficient documentation

## 2021-05-23 DIAGNOSIS — Z7901 Long term (current) use of anticoagulants: Secondary | ICD-10-CM | POA: Diagnosis not present

## 2021-05-23 DIAGNOSIS — H6591 Unspecified nonsuppurative otitis media, right ear: Secondary | ICD-10-CM

## 2021-05-23 DIAGNOSIS — Z20822 Contact with and (suspected) exposure to covid-19: Secondary | ICD-10-CM | POA: Diagnosis not present

## 2021-05-23 DIAGNOSIS — H73891 Other specified disorders of tympanic membrane, right ear: Secondary | ICD-10-CM | POA: Diagnosis not present

## 2021-05-23 DIAGNOSIS — I4891 Unspecified atrial fibrillation: Secondary | ICD-10-CM | POA: Insufficient documentation

## 2021-05-23 DIAGNOSIS — R42 Dizziness and giddiness: Secondary | ICD-10-CM | POA: Diagnosis not present

## 2021-05-23 DIAGNOSIS — R519 Headache, unspecified: Secondary | ICD-10-CM | POA: Diagnosis not present

## 2021-05-23 DIAGNOSIS — Z8616 Personal history of COVID-19: Secondary | ICD-10-CM | POA: Insufficient documentation

## 2021-05-23 DIAGNOSIS — I2510 Atherosclerotic heart disease of native coronary artery without angina pectoris: Secondary | ICD-10-CM | POA: Insufficient documentation

## 2021-05-23 DIAGNOSIS — I110 Hypertensive heart disease with heart failure: Secondary | ICD-10-CM | POA: Diagnosis not present

## 2021-05-23 DIAGNOSIS — Z794 Long term (current) use of insulin: Secondary | ICD-10-CM | POA: Diagnosis not present

## 2021-05-23 DIAGNOSIS — R52 Pain, unspecified: Secondary | ICD-10-CM

## 2021-05-23 DIAGNOSIS — Z79899 Other long term (current) drug therapy: Secondary | ICD-10-CM | POA: Diagnosis not present

## 2021-05-23 DIAGNOSIS — M7918 Myalgia, other site: Secondary | ICD-10-CM

## 2021-05-23 DIAGNOSIS — R112 Nausea with vomiting, unspecified: Secondary | ICD-10-CM | POA: Diagnosis not present

## 2021-05-23 DIAGNOSIS — S299XXA Unspecified injury of thorax, initial encounter: Secondary | ICD-10-CM | POA: Diagnosis not present

## 2021-05-23 LAB — COMPREHENSIVE METABOLIC PANEL
ALT: 19 U/L (ref 0–44)
AST: 18 U/L (ref 15–41)
Albumin: 3.5 g/dL (ref 3.5–5.0)
Alkaline Phosphatase: 75 U/L (ref 38–126)
Anion gap: 13 (ref 5–15)
BUN: 16 mg/dL (ref 8–23)
CO2: 25 mmol/L (ref 22–32)
Calcium: 9.1 mg/dL (ref 8.9–10.3)
Chloride: 99 mmol/L (ref 98–111)
Creatinine, Ser: 1.65 mg/dL — ABNORMAL HIGH (ref 0.61–1.24)
GFR, Estimated: 47 mL/min — ABNORMAL LOW (ref >60)
Glucose, Bld: 240 mg/dL — ABNORMAL HIGH (ref 70–99)
Potassium: 3.8 mmol/L (ref 3.5–5.1)
Sodium: 137 mmol/L (ref 135–145)
Total Bilirubin: 1 mg/dL (ref 0.3–1.2)
Total Protein: 6.7 g/dL (ref 6.5–8.1)

## 2021-05-23 LAB — CBC WITH DIFFERENTIAL/PLATELET
Abs Immature Granulocytes: 0.02 10*3/uL (ref 0.00–0.07)
Basophils Absolute: 0 10*3/uL (ref 0.0–0.1)
Basophils Relative: 0 %
Eosinophils Absolute: 0.1 10*3/uL (ref 0.0–0.5)
Eosinophils Relative: 1 %
HCT: 44.1 % (ref 39.0–52.0)
Hemoglobin: 14.7 g/dL (ref 13.0–17.0)
Immature Granulocytes: 0 %
Lymphocytes Relative: 22 %
Lymphs Abs: 1.4 10*3/uL (ref 0.7–4.0)
MCH: 29.6 pg (ref 26.0–34.0)
MCHC: 33.3 g/dL (ref 30.0–36.0)
MCV: 88.7 fL (ref 80.0–100.0)
Monocytes Absolute: 0.4 10*3/uL (ref 0.1–1.0)
Monocytes Relative: 7 %
Neutro Abs: 4.3 10*3/uL (ref 1.7–7.7)
Neutrophils Relative %: 70 %
Platelets: 225 10*3/uL (ref 150–400)
RBC: 4.97 MIL/uL (ref 4.22–5.81)
RDW: 12.6 % (ref 11.5–15.5)
WBC: 6.2 10*3/uL (ref 4.0–10.5)
nRBC: 0 % (ref 0.0–0.2)

## 2021-05-23 LAB — URINALYSIS, ROUTINE W REFLEX MICROSCOPIC
Bacteria, UA: NONE SEEN
Bilirubin Urine: NEGATIVE
Glucose, UA: >=500 mg/dL — AB
Ketones, ur: NEGATIVE mg/dL
Nitrite: NEGATIVE
Protein, ur: 30 mg/dL — AB
Specific Gravity, Urine: 1.023 (ref 1.005–1.030)
pH: 5 (ref 5.0–8.0)

## 2021-05-23 LAB — CBG MONITORING, ED: Glucose-Capillary: 146 mg/dL — ABNORMAL HIGH (ref 70–99)

## 2021-05-23 LAB — RESP PANEL BY RT-PCR (FLU A&B, COVID) ARPGX2
Influenza A by PCR: NEGATIVE
Influenza B by PCR: NEGATIVE
SARS Coronavirus 2 by RT PCR: NEGATIVE

## 2021-05-23 LAB — TROPONIN I (HIGH SENSITIVITY)
Troponin I (High Sensitivity): 20 ng/L — ABNORMAL HIGH (ref <18)
Troponin I (High Sensitivity): 19 ng/L — ABNORMAL HIGH (ref <18)

## 2021-05-23 LAB — BRAIN NATRIURETIC PEPTIDE: B Natriuretic Peptide: 19.1 pg/mL (ref 0.0–100.0)

## 2021-05-23 LAB — LIPASE, BLOOD: Lipase: 29 U/L (ref 11–51)

## 2021-05-23 MED ORDER — ONDANSETRON 4 MG PO TBDP
4.0000 mg | ORAL_TABLET | Freq: Once | ORAL | Status: AC
  Administered 2021-05-23: 4 mg via ORAL
  Filled 2021-05-23: qty 1

## 2021-05-23 MED ORDER — ONDANSETRON 4 MG PO TBDP: 4.0000 mg | ORAL_TABLET | Freq: Three times a day (TID) | ORAL | 0 refills | Status: AC | PRN

## 2021-05-23 MED ORDER — LIDOCAINE 5 % EX PTCH
1.0000 | MEDICATED_PATCH | Freq: Once | CUTANEOUS | Status: DC
  Administered 2021-05-23: 1 via TRANSDERMAL
  Filled 2021-05-23: qty 1

## 2021-05-23 MED ORDER — ACETAMINOPHEN 325 MG PO TABS
650.0000 mg | ORAL_TABLET | Freq: Once | ORAL | Status: AC
  Administered 2021-05-23: 650 mg via ORAL
  Filled 2021-05-23: qty 2

## 2021-05-23 MED ORDER — MECLIZINE HCL 25 MG PO TABS
25.0000 mg | ORAL_TABLET | Freq: Once | ORAL | Status: AC
  Administered 2021-05-23: 25 mg via ORAL
  Filled 2021-05-23: qty 1

## 2021-05-23 NOTE — ED Notes (Addendum)
EDP spoke to Pt regarding potential d/c.  Immediately after EDP left, Pt began coughing and spitting into an emesis bag.  No emesis noted.
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
## 2021-05-23 NOTE — ED Notes (Signed)
ED Provider at bedside. 

## 2021-05-23 NOTE — Discharge Instructions (Addendum)
Dear Calvin [name],  Thank you for allowing [place] to take care of you today.  We hope you begin feeling better soon.  - Please follow-up with your primary care physician or schedule an appointment to establish a primary care doctor if you do not have one already. - Please return to the Emergency Department or call 911 for chest pain, shortness of breath, severe pain, altered mental status, or if you have any reason to think you may need emergency medical care. - May consider using Meclizine over the counter as directed short-term for dizziness -Remember to stay well-hydrated and eat frequent meals -Follow up with your PCP -Follow-up with ENT, number listed above, regarding dizziness -Use Zofran as directed as needed for nausea   Sincerely,  Cherly Hensen, DO Department of Emergency Medicine [place]   Musculoskeletal pain  Pain - Plan: DG Ribs Unilateral W/Chest Right, DG Ribs Unilateral W/Chest Right  Dizziness  Nausea and vomiting, unspecified vomiting type  Fluid level behind tympanic membrane of right ear
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
## 2021-05-23 NOTE — ED Notes (Signed)
Pt aware we need a urine sample.  Sts he is unable to provide a sample.

## 2021-05-23 NOTE — ED Notes (Addendum)
Pt continually asking for food.  Educated that we must wait until all scans are back.

## 2021-05-23 NOTE — ED Notes (Signed)
Patient transported to CT 

## 2021-05-23 NOTE — ED Notes (Signed)
Pt able to tolerate sandwich and soda.
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
## 2021-05-26 ENCOUNTER — Telehealth: Payer: Self-pay

## 2021-05-26 NOTE — Telephone Encounter (Addendum)
-----   Message from [name], NP sent at 05/18/2021  9:32 PM EST ----- Please have pt follow up in office in the next 2 weeks (any provider) for blood pressure- have him take blood pressure at home after medication and record.  Bp high on our visit and in endocrine office.   Tried calling patient to schedule appointment. Phone was answered and then someone hung up. Will try again later.

## 2021-06-01 ENCOUNTER — Encounter: Payer: Self-pay | Admitting: Adult Health

## 2021-06-01 ENCOUNTER — Other Ambulatory Visit: Payer: Self-pay

## 2021-06-01 ENCOUNTER — Ambulatory Visit (INDEPENDENT_AMBULATORY_CARE_PROVIDER_SITE_OTHER): Payer: Medicare Other | Admitting: Adult Health

## 2021-06-01 VITALS — BP 138/90 | HR 103 | Temp 97.5°F | Resp 16 | Ht 66.0 in | Wt 179.8 lb

## 2021-06-01 DIAGNOSIS — K5904 Chronic idiopathic constipation: Secondary | ICD-10-CM | POA: Diagnosis not present

## 2021-06-01 DIAGNOSIS — I10 Essential (primary) hypertension: Secondary | ICD-10-CM | POA: Diagnosis not present
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
## 2021-06-01 NOTE — Patient Instructions (Signed)

## 2021-06-02 ENCOUNTER — Other Ambulatory Visit (HOSPITAL_COMMUNITY): Payer: Self-pay

## 2021-06-02 ENCOUNTER — Encounter (HOSPITAL_COMMUNITY): Payer: Self-pay

## 2021-06-02 ENCOUNTER — Ambulatory Visit (HOSPITAL_COMMUNITY)
Admission: RE | Admit: 2021-06-02 | Discharge: 2021-06-02 | Disposition: A | Discharge status: Home / Self Care | Payer: Medicare Other | Source: Ambulatory Visit | Attending: Family Medicine | Admitting: Family Medicine

## 2021-06-02 VITALS — BP 148/90 | HR 92 | Wt 178.0 lb

## 2021-06-02 DIAGNOSIS — E1136 Type 2 diabetes mellitus with diabetic cataract: Secondary | ICD-10-CM | POA: Diagnosis not present

## 2021-06-02 DIAGNOSIS — Z7901 Long term (current) use of anticoagulants: Secondary | ICD-10-CM | POA: Diagnosis not present

## 2021-06-02 DIAGNOSIS — I2510 Atherosclerotic heart disease of native coronary artery without angina pectoris: Secondary | ICD-10-CM | POA: Insufficient documentation

## 2021-06-02 DIAGNOSIS — Z7984 Long term (current) use of oral hypoglycemic drugs: Secondary | ICD-10-CM | POA: Diagnosis not present

## 2021-06-02 DIAGNOSIS — I428 Other cardiomyopathies: Secondary | ICD-10-CM | POA: Diagnosis not present

## 2021-06-02 DIAGNOSIS — Z79899 Other long term (current) drug therapy: Secondary | ICD-10-CM | POA: Insufficient documentation

## 2021-06-02 DIAGNOSIS — R7989 Other specified abnormal findings of blood chemistry: Secondary | ICD-10-CM | POA: Diagnosis not present

## 2021-06-02 DIAGNOSIS — E785 Hyperlipidemia, unspecified: Secondary | ICD-10-CM | POA: Diagnosis not present

## 2021-06-02 DIAGNOSIS — E1140 Type 2 diabetes mellitus with diabetic neuropathy, unspecified: Secondary | ICD-10-CM

## 2021-06-02 DIAGNOSIS — I252 Old myocardial infarction: Secondary | ICD-10-CM | POA: Insufficient documentation

## 2021-06-02 DIAGNOSIS — I5042 Chronic combined systolic (congestive) and diastolic (congestive) heart failure: Secondary | ICD-10-CM | POA: Insufficient documentation

## 2021-06-02 DIAGNOSIS — Z713 Dietary counseling and surveillance: Secondary | ICD-10-CM | POA: Insufficient documentation

## 2021-06-02 DIAGNOSIS — K219 Gastro-esophageal reflux disease without esophagitis: Secondary | ICD-10-CM | POA: Diagnosis not present

## 2021-06-02 DIAGNOSIS — I480 Paroxysmal atrial fibrillation: Secondary | ICD-10-CM | POA: Insufficient documentation

## 2021-06-02 DIAGNOSIS — I110 Hypertensive heart disease with heart failure: Secondary | ICD-10-CM | POA: Diagnosis not present

## 2021-06-02 DIAGNOSIS — I10 Essential (primary) hypertension: Secondary | ICD-10-CM

## 2021-06-02 DIAGNOSIS — E113293 Type 2 diabetes mellitus with mild nonproliferative diabetic retinopathy without macular edema, bilateral: Secondary | ICD-10-CM | POA: Insufficient documentation

## 2021-06-02 DIAGNOSIS — R42 Dizziness and giddiness: Secondary | ICD-10-CM | POA: Insufficient documentation

## 2021-06-02 DIAGNOSIS — Z8616 Personal history of COVID-19: Secondary | ICD-10-CM | POA: Insufficient documentation

## 2021-06-02 DIAGNOSIS — R10811 Right upper quadrant abdominal tenderness: Secondary | ICD-10-CM | POA: Insufficient documentation

## 2021-06-02 DIAGNOSIS — R1110 Vomiting, unspecified: Secondary | ICD-10-CM | POA: Diagnosis not present

## 2021-06-02 DIAGNOSIS — R519 Headache, unspecified: Secondary | ICD-10-CM | POA: Diagnosis not present

## 2021-06-02 DIAGNOSIS — I5022 Chronic systolic (congestive) heart failure: Secondary | ICD-10-CM | POA: Diagnosis not present

## 2021-06-02 DIAGNOSIS — Z794 Long term (current) use of insulin: Secondary | ICD-10-CM | POA: Diagnosis not present

## 2021-06-02 DIAGNOSIS — R0602 Shortness of breath: Secondary | ICD-10-CM | POA: Diagnosis not present

## 2021-06-02 DIAGNOSIS — R413 Other amnesia: Secondary | ICD-10-CM | POA: Diagnosis not present

## 2021-06-02 MED ORDER — LOSARTAN POTASSIUM 25 MG PO TABS: 12.5000 mg | ORAL_TABLET | Freq: Every day | ORAL | 0 refills | Status: DC
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
## 2021-06-02 NOTE — Progress Notes (Signed)
ReDS Vest / Clip - 06/02/21 1100       ReDS Vest / Clip   Station Marker C    Ruler Value 31    ReDS Value Range Low volume    ReDS Actual Value 33    Anatomical Comments sitting

## 2021-06-02 NOTE — Patient Instructions (Addendum)
Thank you for coming in today  You have been referred to Gastroenterology clinic they will call you to make a appointment  START Losartan 12.5 mg 1/2 tablet daily   You have been referred to the PREP exercise program they will contact you   Your physician recommends that you schedule a follow-up appointment in: 4 months with Dr. Haroldine Laws  At the [facility], you and your health needs are our priority. As part of our continuing mission to provide you with exceptional heart care, we have created designated Provider Care Teams. These Care Teams include your primary Cardiologist (physician) and Advanced Practice Providers (APPs- Physician Assistants and Nurse Practitioners) who all work together to provide you with the care you need, when you need it.   You may see any of the following providers on your designated Care Team at your next follow up: Dr Glori Bickers Dr Haynes Kerns, NP Lyda Jester, [place] [facility] [place], [place] Audry Riles, PharmD   Please be sure to bring in all your medications bottles to every appointment.   If you have any questions or concerns before your next appointment please send [place] a message through [place] or call our office at [phone].    TO LEAVE A MESSAGE FOR THE NURSE SELECT OPTION 2, PLEASE LEAVE A MESSAGE INCLUDING: YOUR NAME DATE OF BIRTH CALL BACK NUMBER REASON FOR CALL**this is important as we prioritize the call backs  YOU WILL RECEIVE A CALL BACK THE SAME DAY AS LONG AS YOU CALL BEFORE 4:00 PM

## 2021-06-04 ENCOUNTER — Telehealth: Payer: Self-pay

## 2021-06-04 DIAGNOSIS — E1140 Type 2 diabetes mellitus with diabetic neuropathy, unspecified: Secondary | ICD-10-CM | POA: Diagnosis not present

## 2021-06-04 DIAGNOSIS — E261 Secondary hyperaldosteronism: Secondary | ICD-10-CM | POA: Diagnosis not present

## 2021-06-04 DIAGNOSIS — Z23 Encounter for immunization: Secondary | ICD-10-CM | POA: Diagnosis not present

## 2021-06-04 DIAGNOSIS — I110 Hypertensive heart disease with heart failure: Secondary | ICD-10-CM | POA: Diagnosis not present

## 2021-06-04 DIAGNOSIS — E785 Hyperlipidemia, unspecified: Secondary | ICD-10-CM | POA: Diagnosis not present

## 2021-06-04 DIAGNOSIS — Z0001 Encounter for general adult medical examination with abnormal findings: Secondary | ICD-10-CM | POA: Diagnosis not present

## 2021-06-04 DIAGNOSIS — D6869 Other thrombophilia: Secondary | ICD-10-CM | POA: Diagnosis not present

## 2021-06-04 NOTE — Telephone Encounter (Signed)
Call to pt reference PREP referral Explained program to pt Able to start class on 07/05/21 M/W 1p-215pm Will call him closer to start of class to do intake

## 2021-06-09 ENCOUNTER — Ambulatory Visit (HOSPITAL_COMMUNITY)
Admission: RE | Admit: 2021-06-09 | Discharge: 2021-06-09 | Disposition: A | Discharge status: Home / Self Care | Payer: Medicare Other | Source: Ambulatory Visit | Attending: Cardiology | Admitting: Cardiology

## 2021-06-09 ENCOUNTER — Other Ambulatory Visit: Payer: Self-pay

## 2021-06-09 DIAGNOSIS — I5022 Chronic systolic (congestive) heart failure: Secondary | ICD-10-CM | POA: Insufficient documentation

## 2021-06-09 LAB — BASIC METABOLIC PANEL
Anion gap: 7 (ref 5–15)
BUN: 15 mg/dL (ref 8–23)
CO2: 24 mmol/L (ref 22–32)
Calcium: 8.7 mg/dL — ABNORMAL LOW (ref 8.9–10.3)
Chloride: 106 mmol/L (ref 98–111)
Creatinine, Ser: 1.63 mg/dL — ABNORMAL HIGH (ref 0.61–1.24)
GFR, Estimated: 48 mL/min — ABNORMAL LOW (ref >60)
Glucose, Bld: 235 mg/dL — ABNORMAL HIGH (ref 70–99)
Potassium: 3.7 mmol/L (ref 3.5–5.1)
Sodium: 137 mmol/L (ref 135–145)

## 2021-06-13 ENCOUNTER — Other Ambulatory Visit: Payer: Self-pay | Admitting: Cardiology

## 2021-06-13 ENCOUNTER — Other Ambulatory Visit (HOSPITAL_COMMUNITY): Payer: Self-pay | Admitting: Internal Medicine

## 2021-06-21 ENCOUNTER — Telehealth: Payer: Self-pay

## 2021-06-21 NOTE — Telephone Encounter (Signed)
Call to patient about next PREP class  Can do the Jan 2nd  class at [place] scheduled for 07/02/22 10am

## 2021-06-22 ENCOUNTER — Ambulatory Visit: Payer: Medicare Other | Admitting: Physician Assistant

## 2021-06-29 ENCOUNTER — Encounter: Payer: Self-pay | Admitting: Family Medicine

## 2021-07-02 ENCOUNTER — Telehealth: Payer: Self-pay

## 2021-07-02 NOTE — Telephone Encounter (Signed)
Call to pt reference missed intake appt at 10am Unable to make it today Still plans on coming for class on Monday Intake rescheduled for Tuesday 07/06/21 at 415pm

## 2021-07-06 ENCOUNTER — Telehealth: Payer: Self-pay
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
## 2021-07-08 ENCOUNTER — Ambulatory Visit: Payer: Medicare HMO | Admitting: Neurology

## 2021-07-12 ENCOUNTER — Other Ambulatory Visit: Payer: Self-pay

## 2021-07-12 ENCOUNTER — Other Ambulatory Visit: Payer: Self-pay | Admitting: Nurse Practitioner

## 2021-07-12 DIAGNOSIS — K219 Gastro-esophageal reflux disease without esophagitis: Secondary | ICD-10-CM

## 2021-07-12 MED ORDER — CARVEDILOL 3.125 MG PO TABS: 3.1250 mg | ORAL_TABLET | Freq: Two times a day (BID) | ORAL | 10 refills | Status: DC

## 2021-07-26 ENCOUNTER — Other Ambulatory Visit: Payer: Self-pay | Admitting: *Deleted

## 2021-07-26 NOTE — Patient Outreach (Signed)
[facility]) Care Management  07/26/2021  Kinney Sackmann [birth year] [id number]       RN Health Coach telephone call to patient.  [employer] compliance verified. Per patient he was on his way to a Dr appointment. Patient asked nurse to reschedule.  [place] Care Management [phone]

## 2021-07-27 ENCOUNTER — Encounter: Payer: Medicare Other | Admitting: Nurse Practitioner

## 2021-08-10 ENCOUNTER — Other Ambulatory Visit: Payer: Self-pay | Admitting: Nurse Practitioner

## 2021-08-10 DIAGNOSIS — M5441 Lumbago with sciatica, right side: Secondary | ICD-10-CM

## 2021-08-12 ENCOUNTER — Other Ambulatory Visit: Payer: Self-pay | Admitting: Nurse Practitioner

## 2021-08-12 DIAGNOSIS — M5441 Lumbago with sciatica, right side: Secondary | ICD-10-CM

## 2021-08-13 ENCOUNTER — Other Ambulatory Visit: Payer: Self-pay | Admitting: Nurse Practitioner

## 2021-08-13 ENCOUNTER — Telehealth: Payer: Self-pay

## 2021-08-13 DIAGNOSIS — M5441 Lumbago with sciatica, right side: Secondary | ICD-10-CM

## 2021-08-13 NOTE — Telephone Encounter (Signed)
Attempted to reach pt regarding next PREP class starting 08/23/21 and 09/14/21 both at [facility].  Unable to leave message as mailbox not set up.  Will try again

## 2021-08-16 ENCOUNTER — Ambulatory Visit: Payer: Medicare Other | Admitting: Nurse Practitioner

## 2021-08-16 ENCOUNTER — Other Ambulatory Visit: Payer: Self-pay | Admitting: Nurse Practitioner

## 2021-08-16 DIAGNOSIS — M5441 Lumbago with sciatica, right side: Secondary | ICD-10-CM

## 2021-08-24 ENCOUNTER — Encounter: Payer: Self-pay | Admitting: Psychology

## 2021-08-24 ENCOUNTER — Ambulatory Visit (INDEPENDENT_AMBULATORY_CARE_PROVIDER_SITE_OTHER): Payer: Medicare Other | Admitting: Psychology

## 2021-08-24 ENCOUNTER — Ambulatory Visit: Payer: Medicare Other

## 2021-08-24 ENCOUNTER — Other Ambulatory Visit: Payer: Self-pay

## 2021-08-24 DIAGNOSIS — I428 Other cardiomyopathies: Secondary | ICD-10-CM | POA: Insufficient documentation

## 2021-08-24 DIAGNOSIS — F0670 Mild neurocognitive disorder due to known physiological condition without behavioral disturbance: Secondary | ICD-10-CM | POA: Insufficient documentation

## 2021-08-24 DIAGNOSIS — G309 Alzheimer's disease, unspecified: Secondary | ICD-10-CM | POA: Diagnosis not present

## 2021-08-24 DIAGNOSIS — I5022 Chronic systolic (congestive) heart failure: Secondary | ICD-10-CM

## 2021-08-24 DIAGNOSIS — R4189 Other symptoms and signs involving cognitive functions and awareness: Secondary | ICD-10-CM

## 2021-08-24 DIAGNOSIS — Z794 Long term (current) use of insulin: Secondary | ICD-10-CM

## 2021-08-24 DIAGNOSIS — E1140 Type 2 diabetes mellitus with diabetic neuropathy, unspecified: Secondary | ICD-10-CM

## 2021-08-24 DIAGNOSIS — I219 Acute myocardial infarction, unspecified: Secondary | ICD-10-CM | POA: Insufficient documentation

## 2021-08-24 DIAGNOSIS — E11319 Type 2 diabetes mellitus with unspecified diabetic retinopathy without macular edema: Secondary | ICD-10-CM | POA: Insufficient documentation

## 2021-08-24 DIAGNOSIS — I2510 Atherosclerotic heart disease of native coronary artery without angina pectoris: Secondary | ICD-10-CM | POA: Insufficient documentation

## 2021-08-24 DIAGNOSIS — E113499 Type 2 diabetes mellitus with severe nonproliferative diabetic retinopathy without macular edema, unspecified eye: Secondary | ICD-10-CM | POA: Insufficient documentation

## 2021-08-24 HISTORY — DX: Mild neurocognitive disorder due to known physiological condition without behavioral disturbance: F06.70

## 2021-08-24 HISTORY — DX: Alzheimer's disease, unspecified: G30.9

## 2021-08-24 NOTE — Progress Notes (Signed)
° °  Psychometrician Note   Cognitive testing was administered to Calvin [name] by Cruzita Lederer, B.S. (psychometrist) under the supervision of Dr. Christia Reading, Ph.D., licensed psychologist on 08/24/2021. Calvin [name] did not appear overtly distressed by the testing session per behavioral observation or responses across self-report questionnaires. Rest breaks were offered.    The battery of tests administered was selected by Dr. Christia Reading, Ph.D. with consideration to Calvin [name]'s current level of functioning, the nature of his symptoms, emotional and behavioral responses during interview, level of literacy, observed level of motivation/effort, and the nature of the referral question. This battery was communicated to the psychometrist. Communication between Dr. Christia Reading, Ph.D. and the psychometrist was ongoing throughout the evaluation and Dr. Christia Reading, Ph.D. was immediately accessible at all times. Dr. Christia Reading, Ph.D. provided supervision to the psychometrist on the date of this service to the extent necessary to assure the quality of all services provided.    Calvin [name] will return within approximately 1-2 weeks for an interactive feedback session with Dr. Melvyn Novas at which time his test performances, clinical impressions, and treatment recommendations will be reviewed in detail. Calvin [name] understands he can contact our office should he require our assistance before this time.  A total of 110 minutes of billable time were spent face-to-face with Calvin [name] by the psychometrist. This includes both test administration and scoring time. Billing for these services is reflected in the clinical report generated by Dr. Christia Reading, Ph.D.  This note reflects time spent with the psychometrician and does not include test scores or any clinical interpretations made by Dr. Melvyn Novas. The full report will follow in a separate note.

## 2021-08-24 NOTE — Progress Notes (Signed)
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 confrontation naming was exceptionally low.      Assessed visuospatial/visuoconstructional abilities were well below average to below average. Points were lost on his drawing of a clock due to the numbers being placed in counter-clockwise fashion, as well as incorrect hand placement. Points were lost on his copy of a complex figure due to a very poorly planned and disorganized piecemeal approach.    Learning (i.e., encoding) of novel verbal and visual information was exceptionally low. Spontaneous delayed recall (i.e., retrieval) of previously learned information was exceptionally low to below average. Retention rates were 0% across a story learning task, 0% across a list learning task, and 200% across a shape learning task (likely due to happenstance guessing). Performance across recognition tasks was exceptionally low to well below average, suggesting minimal evidence for information consolidation.   Results of emotional screening instruments suggested that recent symptoms of  generalized anxiety were in the mild range, while symptoms of depression were within normal limits. A screening instrument assessing recent sleep quality suggested the presence of moderate sleep dysfunction.  Tables of Scores:   Note: This summary of test scores accompanies the interpretive report and should not be considered in isolation without reference to the appropriate sections in the text. Descriptors are based on appropriate normative data and may be adjusted based on clinical judgment. Terms such as "Within Normal Limits" and "Outside Normal Limits" are used when a more specific description of the test score cannot be determined.       Percentile - Normative Descriptor > 98 - Exceptionally High 91-97 - Well Above Average 75-90 - Above Average 25-74 - Average 9-24 - Below Average 2-8 - Well Below Average < 2 - Exceptionally Low       Validity:   DESCRIPTOR       ACS Word Choice: --- --- Within Normal Limits  Dot Counting Test: --- --- Within Normal Limits  NAB EVI: --- --- Outside Normal Limits       Orientation:      Raw Score Percentile   NAB Orientation, Form  1 28/29 --- ---       Cognitive Screening:      Raw Score Percentile   SLUMS: 12/30 --- ---       Intellectual Functioning:      Standard Score Percentile   Test of Premorbid Functioning: 30 2 Well Below Average       Memory:     NAB Memory Module, Form 1: Standard Score/ T Score Percentile   Total Memory Index 52 <1 Exceptionally Low  List Learning       Total Trials 1-3 7/36 (19) <1 Exceptionally Low    List B 2/12 (36) 8 Well Below Average    Short Delay Free Recall 0/12 (19) <1 Exceptionally Low    Long Delay Free Recall 0/12 (25) 1 Exceptionally Low    Retention Percentage 0 (<19) <1 Exceptionally Low    Recognition Discriminability -3 (<19) <1 Exceptionally Low  Shape Learning       Total Trials 1-3 3/27 (19) <1 Exceptionally Low    Delayed Recall 4/9 (40) 16 Below Average    Retention Percentage  200 (87) >99 Exceptionally High    Recognition Discriminability 1 (22) <1 Exceptionally Low  Story Learning       Immediate Recall 26/80 (26) 1 Exceptionally Low    Delayed Recall 0/40 (30) 2 Well Below Average    Retention Percentage 0 (<19) <1 Exceptionally Low  Daily Living Memory       Immediate Recall 16/51 (19) <1 Exceptionally Low    Delayed Recall 6/17 (23) <1 Exceptionally Low    Retention Percentage 75 (40) 16 Below Average    Recognition Hits 7/10 (33) 5 Well Below Average       Attention/Executive Function:     Trail Making Test (TMT): Raw Score (T Score) Percentile     Part A 44 secs.,  0 errors (48) 42 Average    Part B 123 secs.,  0 errors (49) 46 Average         Scaled Score Percentile   WAIS-IV Coding: 6 9 Below Average       NAB Attention Module, Form 1: T Score Percentile     Digits Forward 38 12 Below Average    Digits Backwards 23 <1 Exceptionally Low        Scaled Score Percentile   WAIS-IV Similarities: 3 1 Exceptionally Low       D-KEFS Verbal Fluency Test: Raw Score (Scaled Score) Percentile     Letter Total Correct 26 (7) 16 Below Average    Category Total Correct 12 (1) <1 Exceptionally Low    Category Switching Total Correct 10 (7) 16 Below Average    Category Switching Accuracy 10 (9) 37 Average      Total Set Loss Errors 2 (10) 50 Average      Total Repetition Errors 3 (10) 50 Average       NAB Executive Functions Module, Form 1: T Score Percentile     Judgment 27 1 Exceptionally Low       Language:     Verbal Fluency Test: Raw Score (T Score) Percentile     Phonemic Fluency (FAS) 26 (42) 21 Below Average    Animal Fluency 9 (31) 3 Well Below Average        NAB Language Module, Form 1: T Score Percentile   Naming 21/31 (19) <1 Exceptionally Low       Visuospatial/Visuoconstruction:      Raw Score Percentile   Clock Drawing: 5/10 ---  Impaired       NAB Spatial Module, Form 1: T Score Percentile     Figure Drawing Copy 36 8 Well  Below Average        Scaled Score Percentile   WAIS-IV Block Design: 6 9 Below Average  WAIS-IV Matrix Reasoning: 5 5 Well Below Average  WAIS-IV Visual Puzzles: 5 5 Well Below Average       Mood and Personality:      Raw Score Percentile   Beck Depression Inventory - II: 4 --- Within Normal Limits  PROMIS Anxiety Questionnaire: 18 --- Mild       Additional Questionnaires:      Raw Score Percentile   PROMIS Sleep Disturbance Questionnaire: 33 --- Moderate   Informed Consent and Coding/Compliance:   The current evaluation represents a clinical evaluation for the purposes previously outlined by the referral source and is in no way reflective of a forensic evaluation.   Mr. Maulden was provided with a verbal description of the nature and purpose of the present neuropsychological evaluation. Also reviewed were the foreseeable risks and/or discomforts and benefits of the procedure, limits of confidentiality, and mandatory reporting requirements of this provider. The patient was given the opportunity to ask questions and receive answers about the evaluation. Oral consent to participate was provided by the patient.   This evaluation was conducted by Christia Reading, Ph.D., ABPP-CN, board certified clinical neuropsychologist. Mr. Plascencia completed a clinical interview with Dr. Melvyn Novas, billed as one unit [phone], and 110 minutes of cognitive testing and scoring, billed as one unit [phone] and three additional units 96139. Psychometrist Cruzita Lederer, B.S., assisted Dr. Melvyn Novas with test administration and scoring procedures. As a separate and discrete service, Dr. Melvyn Novas spent a total of 160 minutes in interpretation and report writing billed as one unit [phone] and two units 96133.

## 2021-08-25 ENCOUNTER — Other Ambulatory Visit: Payer: Self-pay | Admitting: *Deleted

## 2021-08-25 NOTE — Patient Outreach (Signed)
[facility]) Care Management RN Health Coach Note   08/25/2021 Name:  Calvin [name] MRN:  [id number] DOB:  [birth year]  Summary: Per patient blood sugar was 245. He is monitoring blood sugars twice a week. A1C is 7.8.  patient appetite is good. Patient is ambulating for exercise. Patient uses a walker when raining or snow. Per patient he needs a new glucometer.  Recommendations/Changes made from today's visit: Follow up with PCP to get a new glucometer  Subjective: Calvin [name] is an 61 y.o. year old male who is a primary patient of Roselee Nova, MD. The care management team was consulted for assistance with care management and/or care coordination needs.    RN Health Coach completed Telephone Visit today.   Objective:  Medications Reviewed Today     Reviewed by Rafael Bihari, FNP (Family Nurse Practitioner) on 06/02/21 at 1324  Med List Status: <None>   Medication Order Taking? Sig Documenting Provider Last Dose Status Informant  albuterol (VENTOLIN HFA) 108 (90 Base) MCG/ACT inhaler [id number] Yes Inhale 2 puffs into the lungs every 6 (six) hours as needed for wheezing or shortness of breath. Thurnell Lose, MD Taking Active Self  atorvastatin (LIPITOR) 80 MG tablet [id number] Yes TAKE ONE TABLET BY MOUTH EVERYDAY AT BEDTIME [name], [name], MD Taking Active   bisacodyl (DULCOLAX) 5 MG EC tablet [id number] Yes Take 2 tablets (10 mg total) by mouth every other day. Take at bedtime [name], MD Taking Active Self  carvedilol (COREG) 3.125 MG tablet [id number] Yes Take 1 tablet (3.125 mg total) by mouth 2 (two) times daily with a meal. Joette Catching, [name] Taking Active   dapagliflozin propanediol (FARXIGA) 10 MG TABS tablet [id number] Yes Take 1 tablet (10 mg total) by mouth daily before breakfast. [name], [name], MD Taking Active Self  donepezil (ARICEPT) 10 MG tablet [id number] Yes Take 1 tablet (10 mg total) by mouth at bedtime.  Rondel Jumbo, [name] Taking Active Self  ELIQUIS 5 MG TABS tablet [id number] Yes TAKE ONE TABLET BY MOUTH EVERY MORNING and TAKE ONE TABLET BY MOUTH EVERYDAY AT BEDTIME Larey Dresser, MD Taking Active Self  ezetimibe (ZETIA) 10 MG tablet [id number] Yes TAKE ONE TABLET BY MOUTH EVERY MORNING [name], [name], MD Taking Active   gabapentin (NEURONTIN) 100 MG capsule [id number] Yes TAKE ONE CAPSULE BY MOUTH EVERY MORNING and TAKE ONE CAPSULE BY MOUTH AT NOON and TAKE ONE CAPSULE BY MOUTH EVERYDAY AT BEDTIME [name], NP Taking Active   insulin degludec (TRESIBA FLEXTOUCH) 200 UNIT/ML FlexTouch Pen [id number] Yes Inject 52 Units into the skin daily. [name], MD Taking Active Self  Insulin Syringe-Needle U-100 (INSULIN SYRINGE .3CC/29GX1/2") 29G X 1/2" 0.3 ML MISC [id number] Yes Inject 1 Syringe 3 (three) times daily as directed. Check blood sugar three times daily. Dx: E11.9 [name], DO Taking Active   Lancets (ONETOUCH DELICA PLUS TWSFKC12X) [place] [id number] Yes USE TWICE DAILY [name], MD Taking Active Self  losartan (COZAAR) 25 MG tablet [id number] Yes Take 0.5 tablets (12.5 mg total) by mouth daily. Benson, [place], FNP  Active   nitroGLYCERIN (NITROSTAT) 0.4 MG SL tablet [id number] Yes Place 1 tablet (0.4 mg total) under the tongue every 5 (five) minutes as needed for chest pain. [name], DO Taking Active   [facility] ULTRA test strip [id number] Yes USE TWICE DAILY [name], MD Taking Active Self  pantoprazole (PROTONIX) 40 MG tablet [id number] Yes Take 1 tablet (40  mg total) by mouth daily. [name], NP Taking Active Self  polyethylene glycol (MIRALAX / GLYCOLAX) 17 g packet [id number] Yes Take 17 g by mouth 2 (two) times daily. [name], MD Taking Active Self  REPATHA 140 MG/ML Babette Relic [id number] Yes INJECT 1 PEN INTO THE SKIN EVERY 14 DAYS [name], [name], MD Taking Active Self  spironolactone (ALDACTONE) 25 MG tablet [id number] Yes Take 1 tablet  (25 mg total) by mouth daily. Consuelo Pandy, [name] Taking Active Self  Med List Note [place] Daily 10/15/19 1530): [facility] health Nurse) - Assists patient with medication administration. <[phone]>             SDOH:  (Social Determinants of Health) assessments and interventions performed:  SDOH Interventions    Flowsheet Row Most Recent Value  SDOH Interventions   Food Insecurity Interventions Intervention Not Indicated  Housing Interventions Intervention Not Indicated       Care Plan  Review of patient past medical history, allergies, medications, health status, including review of consultants reports, laboratory and other test data, was performed as part of comprehensive evaluation for care management services.   Care Plan : General Nursing  (Adult)  Updates made by Snyder Colavito, Eppie Gibson, RN since 08/25/2021 12:00 AM     Problem: Knowledge Deficit Related to Diabetes and Care Coordination Needs   Priority: High     Long-Range Goal: Development Plan of Care for Management of Diabetes   Start Date: 08/25/2021  Expected End Date: 08/25/2021  Priority: High  Note:   Current Barriers:  Knowledge Deficits related to plan of care for management of DMII   RNCM Clinical Goal(s):  Patient will verbalize understanding of plan for management of DMII as evidenced by continuation of monitoring blood sugars and adhering to diabetic diet  through collaboration with RN Care manager, provider, and care team.   Interventions: Inter-disciplinary care team collaboration (see longitudinal plan of care) Evaluation of current treatment plan related to  self management and patient's adherence to plan as established by provider   Diabetes Interventions:  (Status:  Goal on track:  Yes.) Long Term Goal Assessed patient's understanding of A1c goal: <8% Provided education to patient about basic DM disease process Discussed plans with patient for ongoing care management  follow up and provided patient with direct contact information for care management team Lab Results  Component Value Date   HGBA1C 7.8 (A) 05/17/2021   Patient Goals/Self-Care Activities: Take all medications as prescribed Attend all scheduled provider appointments Call pharmacy for medication refills 3-7 days in advance of running out of medications Perform all self care activities independently  Perform IADL's (shopping, preparing meals, housekeeping, managing finances) independently Call provider office for new concerns or questions  call the Suicide and Crisis Lifeline: 988 if experiencing a Mental Health or [place]  check blood sugar at prescribed times: twice daily check feet daily for cuts, sores or redness trim toenails straight across drink 6 to 8 glasses of water each day manage portion size wash and dry feet carefully every day wear comfortable, cotton socks wear comfortable, well-fitting shoes  Follow Up Plan:  Telephone follow up appointment with care management team member scheduled for:  May 2023. The patient has been provided with contact information for the care management team and has been advised to call with any health related questions or concerns.  Follow up with provider re: getting a new glucometer        Plan: Telephone  follow up appointment with care management team member scheduled for:  May 2023 The patient has been provided with contact information for the care management team and has been advised to call with any health related questions or concerns.   [facility] Care Management [phone]

## 2021-08-25 NOTE — Patient Instructions (Signed)
Visit Information  Thank you for taking time to visit with me today. Please don't hesitate to contact me if I can be of assistance to you before our next scheduled telephone appointment.  Following are the goals we discussed today:  RNCM Clinical Goal(s):  Patient will verbalize understanding of plan for management of DMII as evidenced by continuation of monitoring blood sugars and adhering to diabetic diet through collaboration with RN Care manager, provider, and care team.   Interventions: Inter-disciplinary care team collaboration (see longitudinal plan of care) Evaluation of current treatment plan related to  self management and patient's adherence to plan as established by provider   Diabetes Interventions:  (Status:  Goal on track:  Yes.) Long Term Goal Assessed patient's understanding of A1c goal: <8% Provided education to patient about basic DM disease process Discussed plans with patient for ongoing care management follow up and provided patient with direct contact information for care management team Lab Results  Component Value Date   HGBA1C 7.8 (A) 05/17/2021    Patient Goals/Self-Care Activities: Take all medications as prescribed Attend all scheduled provider appointments Call pharmacy for medication refills 3-7 days in advance of running out of medications Perform all self care activities independently  Perform IADL's (shopping, preparing meals, housekeeping, managing finances) independently Call provider office for new concerns or questions  call the Suicide and Crisis Lifeline: 988 if experiencing a Mental Health or [place]  check blood sugar at prescribed times: twice daily check feet daily for cuts, sores or redness trim toenails straight across drink 6 to 8 glasses of water each day manage portion size wash and dry feet carefully every day wear comfortable, cotton socks wear comfortable, well-fitting shoes  Follow Up Plan:  Telephone follow  up appointment with care management team member scheduled for:  May 2023. The patient has been provided with contact information for the care management team and has been advised to call with any health related questions or concerns.  Follow up with provider re: getting a new glucometer   Our next appointment is by telephone on May 2023  Please call Johny Shock RN at [phone] if you need to cancel or reschedule your appointment.   Please call the Suicide and Crisis Lifeline: 988 if you are experiencing a Mental Health or [place] or need someone to talk to.  The patient verbalized understanding of instructions, educational materials, and care plan provided today and agreed to receive a mailed copy of patient instructions, educational materials, and care plan.   Telephone follow up appointment with care management team member scheduled for: The patient has been provided with contact information for the care management team and has been advised to call with any health related questions or concerns.   [facility] Care Management [phone]

## 2021-08-31 ENCOUNTER — Ambulatory Visit (INDEPENDENT_AMBULATORY_CARE_PROVIDER_SITE_OTHER): Payer: Medicare Other | Admitting: Psychology

## 2021-08-31 ENCOUNTER — Other Ambulatory Visit: Payer: Self-pay

## 2021-08-31 DIAGNOSIS — F0670 Mild neurocognitive disorder due to known physiological condition without behavioral disturbance: Secondary | ICD-10-CM

## 2021-08-31 DIAGNOSIS — G309 Alzheimer's disease, unspecified: Secondary | ICD-10-CM

## 2021-08-31 NOTE — Progress Notes (Signed)
° °  Neuropsychology Feedback Session Tillie Rung. [facility] Department of Neurology  Reason for Referral:   Calvin [name] is a 61 y.o. right-handed African-American male referred by  Sharene Butters, [name] , to characterize his current cognitive functioning and assist with diagnostic clarity and treatment planning in the context of subjective cognitive decline and numerous medical comorbidities.   Feedback:   Calvin [name] completed a comprehensive neuropsychological evaluation on 08/24/2021. Please refer to that encounter for the full report and recommendations. Briefly, results suggested severe impairment surrounding all aspects of learning and memory. Additional impairments were exhibited across working memory, semantic fluency, confrontation naming, and a task assessing safety and judgment. Visuospatial abilities were mildly variable but below expectation overall. Further variability was exhibited across executive functioning. The etiology for ongoing dysfunction is somewhat unclear. However, concerns for an early-onset Alzheimer's disease presentation are warranted. Across testing, Calvin [name] exhibited severe impairment across all aspects of memory. He was fully amnestic after a short delay across several tasks and performed very poorly across yes/no recognition trials. This suggests rapid forgetting and the presence of an evolving and already quite significant memory storage deficit, both of which are hallmark signs of Alzheimer's disease. Deficits in semantic fluency, confrontation naming, and visuospatial abilities would fall in line with the expected pattern of disease progression, which strengthens overall concerns.   Calvin [name] was unaccompanied during the current feedback session. Content of the current session focused on the results of his neuropsychological evaluation. Calvin [name] was given the opportunity to ask questions and his questions were answered. He was  encouraged to reach out should additional questions arise. A copy of his report was provided at the conclusion of the visit.      Less than 16 minutes were spent conducting the current feedback session with Calvin [name].

## 2021-09-05 ENCOUNTER — Other Ambulatory Visit: Payer: Self-pay

## 2021-09-05 ENCOUNTER — Emergency Department (HOSPITAL_COMMUNITY): Payer: Medicare Other

## 2021-09-05 ENCOUNTER — Emergency Department (HOSPITAL_COMMUNITY)
Admission: EM | Admit: 2021-09-05 | Discharge: 2021-09-05 | Disposition: A | Discharge status: Home / Self Care | Payer: Medicare Other | Attending: Emergency Medicine | Admitting: Emergency Medicine

## 2021-09-05 ENCOUNTER — Encounter (HOSPITAL_COMMUNITY): Payer: Self-pay | Admitting: Emergency Medicine

## 2021-09-05 DIAGNOSIS — I110 Hypertensive heart disease with heart failure: Secondary | ICD-10-CM | POA: Insufficient documentation

## 2021-09-05 DIAGNOSIS — R1031 Right lower quadrant pain: Secondary | ICD-10-CM | POA: Insufficient documentation

## 2021-09-05 DIAGNOSIS — Z20822 Contact with and (suspected) exposure to covid-19: Secondary | ICD-10-CM | POA: Diagnosis not present

## 2021-09-05 DIAGNOSIS — Z794 Long term (current) use of insulin: Secondary | ICD-10-CM | POA: Insufficient documentation

## 2021-09-05 DIAGNOSIS — Z79899 Other long term (current) drug therapy: Secondary | ICD-10-CM | POA: Insufficient documentation

## 2021-09-05 DIAGNOSIS — R112 Nausea with vomiting, unspecified: Secondary | ICD-10-CM | POA: Insufficient documentation

## 2021-09-05 DIAGNOSIS — E1165 Type 2 diabetes mellitus with hyperglycemia: Secondary | ICD-10-CM | POA: Diagnosis not present

## 2021-09-05 DIAGNOSIS — Z7901 Long term (current) use of anticoagulants: Secondary | ICD-10-CM | POA: Diagnosis not present

## 2021-09-05 DIAGNOSIS — I2510 Atherosclerotic heart disease of native coronary artery without angina pectoris: Secondary | ICD-10-CM | POA: Insufficient documentation

## 2021-09-05 DIAGNOSIS — I5022 Chronic systolic (congestive) heart failure: Secondary | ICD-10-CM | POA: Insufficient documentation

## 2021-09-05 DIAGNOSIS — R109 Unspecified abdominal pain: Secondary | ICD-10-CM | POA: Diagnosis present

## 2021-09-05 LAB — CBC
HCT: 46.7 % (ref 39.0–52.0)
Hemoglobin: 14.7 g/dL (ref 13.0–17.0)
MCH: 28.3 pg (ref 26.0–34.0)
MCHC: 31.5 g/dL (ref 30.0–36.0)
MCV: 90 fL (ref 80.0–100.0)
Platelets: 244 10*3/uL (ref 150–400)
RBC: 5.19 MIL/uL (ref 4.22–5.81)
RDW: 14 % (ref 11.5–15.5)
WBC: 8.4 10*3/uL (ref 4.0–10.5)
nRBC: 0 % (ref 0.0–0.2)

## 2021-09-05 LAB — COMPREHENSIVE METABOLIC PANEL WITH GFR
ALT: 19 U/L (ref 0–44)
AST: 20 U/L (ref 15–41)
Albumin: 3.6 g/dL (ref 3.5–5.0)
Alkaline Phosphatase: 79 U/L (ref 38–126)
Anion gap: 10 (ref 5–15)
BUN: 21 mg/dL (ref 8–23)
CO2: 22 mmol/L (ref 22–32)
Calcium: 9.1 mg/dL (ref 8.9–10.3)
Chloride: 104 mmol/L (ref 98–111)
Creatinine, Ser: 1.62 mg/dL — ABNORMAL HIGH (ref 0.61–1.24)
GFR, Estimated: 48 mL/min — ABNORMAL LOW
Glucose, Bld: 241 mg/dL — ABNORMAL HIGH (ref 70–99)
Potassium: 4.2 mmol/L (ref 3.5–5.1)
Sodium: 136 mmol/L (ref 135–145)
Total Bilirubin: 0.4 mg/dL (ref 0.3–1.2)
Total Protein: 6.9 g/dL (ref 6.5–8.1)

## 2021-09-05 LAB — URINALYSIS, ROUTINE W REFLEX MICROSCOPIC
Bacteria, UA: NONE SEEN
Bilirubin Urine: NEGATIVE
Glucose, UA: >=500 mg/dL — AB
Ketones, ur: NEGATIVE mg/dL
Leukocytes,Ua: NEGATIVE
Nitrite: NEGATIVE
Protein, ur: NEGATIVE mg/dL
Specific Gravity, Urine: 1.024 (ref 1.005–1.030)
pH: 6 (ref 5.0–8.0)

## 2021-09-05 LAB — LIPASE, BLOOD: Lipase: 42 U/L (ref 11–51)

## 2021-09-05 LAB — RESP PANEL BY RT-PCR (FLU A&B, COVID) ARPGX2
Influenza A by PCR: NEGATIVE
Influenza B by PCR: NEGATIVE
SARS Coronavirus 2 by RT PCR: NEGATIVE

## 2021-09-05 MED ORDER — SODIUM CHLORIDE 0.9 % IV BOLUS
500.0000 mL | Freq: Once | INTRAVENOUS | Status: AC
  Administered 2021-09-05: 500 mL via INTRAVENOUS

## 2021-09-05 MED ORDER — ONDANSETRON 4 MG PO TBDP: 4.0000 mg | ORAL_TABLET | Freq: Three times a day (TID) | ORAL | 0 refills | Status: DC | PRN

## 2021-09-05 MED ORDER — IOHEXOL 300 MG/ML  SOLN
75.0000 mL | Freq: Once | INTRAMUSCULAR | Status: AC | PRN
  Administered 2021-09-05: 75 mL via INTRAVENOUS

## 2021-09-05 MED ORDER — ONDANSETRON HCL 4 MG/2ML IJ SOLN
4.0000 mg | Freq: Once | INTRAMUSCULAR | Status: AC
  Administered 2021-09-05: 4 mg via INTRAVENOUS
  Filled 2021-09-05: qty 2

## 2021-09-05 NOTE — ED Triage Notes (Signed)
C/o R sided abd pain, nausea, and vomiting x 1 week.  Denies diarrhea. ?

## 2021-09-05 NOTE — ED Provider Notes (Signed)
[facility] EMERGENCY DEPARTMENT Provider Note   CSN: [id number] Arrival date & time: 09/05/21  0740     History  Chief Complaint  Patient presents with   Abdominal Pain    Calvin [name] is a 61 y.o. male.   Abdominal Pain Associated symptoms: nausea and vomiting   Associated symptoms: no chest pain and no shortness of breath   Patient presents abdominal pain.  Right-sided.  Has had nausea and decreased oral intake.  Some constipation which is not unusual for him.  No fevers or chills.  Pain is dull.  Went to PCP and was told that if continues to vomit to come to the ER.  Still has appendix.  No previous abdominal surgeries.  No dysuria.  Is on anticoagulation for previous atrial fibrillation.  He states his sugars have been high.  Up to 300s.  He is diabetic.  States that he does not feel like eating and feels of his right abdomen is more swollen.   Past Medical History:  Diagnosis Date   Adenoidal hypertrophy    Adrenal nodule 09/21/2020   AF (paroxysmal atrial fibrillation) 03/10/2018   AKI (acute kidney injury) 06/17/2019   Arthritis    Cataract    Mixed OU   Chronic anticoagulation 43/32/9518   Chronic systolic heart failure 84/16/6063   Coronary artery disease    a. cath in 08/2017 showing mild nonobstructive CAD with scattered 20-30% stenosis.    Diabetic neuropathy 09/26/2017   Diabetic retinopathy    NPDR OU   Essential hypertension 11/22/2015   Foot pain, bilateral 02/01/2019   Gastroesophageal reflux disease 05/10/2017   Heart attack    [place]   History of adenomatous colonic polyps 05/19/2011   05/2011 9 adenomas 08/2017 5 adenomas (dimin) Recall 2022   History of COVID-19 06/18/2019   HLD (hyperlipidemia) 11/20/2015   Hypoglycemia due to insulin 10/15/2019   Insomnia 05/10/2017   Intermittent claudication 05/25/2020   Mild neurocognitive disorder due to Alzheimer's disease, possible 08/24/2021   Nonischemic  cardiomyopathy    a. EF 20-25% by echo in 09/2017 with cath showing mild CAD. b.  Last echo 12/2017 EF 35-40%, grade 2 DD.   Obesity    Onychomycosis of multiple toenails with type 2 diabetes mellitus and peripheral neuropathy 05/10/2017   OSA on CPAP 05/10/2017   Right sided weakness 06/17/2019   Type II diabetes mellitus 11/20/2015   Unsteady gait 12/20/2018    Home Medications Prior to Admission medications   Medication Sig Start Date End Date Taking? Authorizing Provider  albuterol (VENTOLIN HFA) 108 (90 Base) MCG/ACT inhaler Inhale 2 puffs into the lungs every 6 (six) hours as needed for wheezing or shortness of breath. 06/30/19  Yes Thurnell Lose, MD  amitriptyline (ELAVIL) 25 MG tablet Take 25 mg by mouth at bedtime. 08/16/21  Yes [provider]  atorvastatin (LIPITOR) 80 MG tablet TAKE ONE TABLET BY MOUTH EVERYDAY AT BEDTIME Patient taking differently: Take 80 mg by mouth at bedtime. 04/14/21  Yes [name], [name], MD  bisacodyl (DULCOLAX) 5 MG EC tablet Take 2 tablets (10 mg total) by mouth every other day. Take at bedtime 12/04/20  Yes [name], MD  carvedilol (COREG) 3.125 MG tablet Take 1 tablet (3.125 mg total) by mouth 2 (two) times daily with a meal. 07/12/21  Yes [name], [name], MD  donepezil (ARICEPT) 10 MG tablet Take 1 tablet (10 mg total) by mouth at bedtime. 03/31/21  Yes Wertman,  Coralee Pesa, [name]  ELIQUIS 5 MG TABS tablet TAKE ONE TABLET BY MOUTH AT BREAKFAST AND AT BEDTIME Patient taking differently: Take 5 mg by mouth 2 (two) times daily. 06/14/21  Yes [name], [name], MD  ENTRESTO 24-26 MG Take 1 tablet by mouth at bedtime. 08/16/21  Yes [provider]  ezetimibe (ZETIA) 10 MG tablet TAKE ONE TABLET BY MOUTH EVERY MORNING Patient taking differently: Take 10 mg by mouth daily. 05/13/21  Yes [name], [name], MD  FARXIGA 10 MG TABS tablet TAKE ONE TABLET BY MOUTH EVERY MORNING Patient taking differently: Take 10 mg by mouth daily.  06/14/21  Yes [name], [name], MD  gabapentin (NEURONTIN) 100 MG capsule TAKE ONE CAPSULE BY MOUTH EVERY MORNING and TAKE ONE CAPSULE BY MOUTH AT NOON and TAKE ONE CAPSULE BY MOUTH EVERYDAY AT BEDTIME Patient taking differently: Take 100 mg by mouth 3 (three) times daily. 08/16/21  Yes [name], NP  insulin degludec (TRESIBA FLEXTOUCH) 200 UNIT/ML FlexTouch Pen Inject 52 Units into the skin daily. Patient taking differently: Inject 45 Units into the skin daily. 10/15/20  Yes [name], MD  losartan (COZAAR) 25 MG tablet Take 0.5 tablets (12.5 mg total) by mouth daily. 06/02/21 09/05/21 Yes Milford, Maricela Bo, FNP  nitroGLYCERIN (NITROSTAT) 0.4 MG SL tablet Place 1 tablet (0.4 mg total) under the tongue every 5 (five) minutes as needed for chest pain. 10/27/17  Yes [name], [name], DO  ondansetron (ZOFRAN-ODT) 4 MG disintegrating tablet Take 1 tablet (4 mg total) by mouth every 8 (eight) hours as needed for nausea or vomiting. 09/05/21  Yes Davonna Belling, MD  pantoprazole (PROTONIX) 40 MG tablet TAKE ONE TABLET BY MOUTH ONCE DAILY Patient taking differently: Take 40 mg by mouth daily. 07/12/21  Yes [name], NP  polyethylene glycol (MIRALAX / GLYCOLAX) 17 g packet Take 17 g by mouth 2 (two) times daily. 10/06/20  Yes [name], MD  REPATHA 140 MG/ML SOSY INJECT 1 PEN INTO THE SKIN EVERY 14 DAYS Patient taking differently: Inject 140 mg as directed every 14 (fourteen) days. 09/08/20  Yes [name], [name], MD  rosuvastatin (CRESTOR) 40 MG tablet Take 40 mg by mouth at bedtime. 08/16/21  Yes [provider]  spironolactone (ALDACTONE) 25 MG tablet Take 1 tablet (25 mg total) by mouth daily. 10/15/20  Yes Simmons, Brittainy M, [name]  Insulin Syringe-Needle U-100 (INSULIN SYRINGE .3CC/29GX1/2") 29G X 1/2" 0.3 ML MISC Inject 1 Syringe 3 (three) times daily as directed. Check blood sugar three times daily. Dx: E11.9 05/10/17   [name], DO  Lancets (ONETOUCH DELICA PLUS  [id number]) [place] 10/30/19   [name], MD  [facility] ULTRA test strip USE TWICE DAILY 10/30/19   [name], MD      Allergies    Patient has no known allergies.    Review of Systems   Review of Systems  Constitutional:  Positive for appetite change.  HENT:  Negative for congestion.   Respiratory:  Negative for shortness of breath.   Cardiovascular:  Negative for chest pain.  Gastrointestinal:  Positive for abdominal pain, nausea and vomiting.   Physical Exam Updated Vital Signs BP 125/82 (BP Location: Left Arm)    Pulse 92    Temp 97.9 F (36.6 C) (Oral)    Resp 18    SpO2 99%  Physical Exam Vitals and nursing note reviewed.  HENT:     Head: Atraumatic.  Cardiovascular:     Rate and  Rhythm: Normal rate.  Pulmonary:     Effort: Pulmonary effort is normal.  Abdominal:     Palpations: There is no splenomegaly.     Comments: Reitz abdominal tenderness.  Upper abdomen and particular lower abdomen.  No ecchymosis.  No hernia palpated.  Skin:    General: Skin is warm.     Capillary Refill: Capillary refill takes less than 2 seconds.  Neurological:     Mental Status: He is alert and oriented to person, place, and time.    ED Results / Procedures / Treatments   Labs (all labs ordered are listed, but only abnormal results are displayed) Labs Reviewed  COMPREHENSIVE METABOLIC PANEL - Abnormal; Notable for the following components:      Result Value   Glucose, Bld 241 (*)    Creatinine, Ser 1.62 (*)    GFR, Estimated 48 (*)    All other components within normal limits  URINALYSIS, ROUTINE W REFLEX MICROSCOPIC - Abnormal; Notable for the following components:   Color, Urine STRAW (*)    Glucose, UA >=500 (*)    Hgb urine dipstick SMALL (*)    All other components within normal limits  RESP PANEL BY RT-PCR (FLU A&B, COVID) ARPGX2  LIPASE, BLOOD  CBC    EKG None  Radiology CT ABDOMEN PELVIS W CONTRAST  Result Date: 09/05/2021 CLINICAL DATA:  Right  lower quadrant abdominal pain EXAM: CT ABDOMEN AND PELVIS WITH CONTRAST TECHNIQUE: Multidetector CT imaging of the abdomen and pelvis was performed using the standard protocol following bolus administration of intravenous contrast. RADIATION DOSE REDUCTION: This exam was performed according to the departmental dose-optimization program which includes automated exposure control, adjustment of the mA and/or kV according to patient size and/or use of iterative reconstruction technique. CONTRAST:  44m OMNIPAQUE IOHEXOL 300 MG/ML  SOLN COMPARISON:  05/12/2021 FINDINGS: Lower chest: No acute abnormality. Hepatobiliary: No focal liver abnormality is seen. No gallstones, gallbladder wall thickening, or biliary dilatation. Pancreas: Unremarkable. No pancreatic ductal dilatation or surrounding inflammatory changes. Spleen: Normal in size without focal abnormality. Adrenals/Urinary Tract: Normal adrenal glands. No nephrolithiasis, hydronephrosis, or mass identified bilaterally. Urinary bladder is unremarkable. Stomach/Bowel: Tiny hiatal hernia. The appendix is visualized and appears within normal limits. No bowel wall thickening, inflammation, or distension. Vascular/Lymphatic: Aortic atherosclerosis. No aneurysm. No abdominopelvic adenopathy. Reproductive: Mild prostate gland enlargement. Other: No free fluid or fluid collections. Musculoskeletal: No acute or significant osseous findings. IMPRESSION: 1. No acute findings within the abdomen or pelvis. 2. The appendix is visualized and appears within normal limits. 3. Mild prostate gland enlargement. 4. Aortic Atherosclerosis (ICD10-I70.0). Electronically Signed   By: TKerby MoorsM.D.   On: 09/05/2021 09:40    Procedures Procedures    Medications Ordered in ED Medications  sodium chloride 0.9 % bolus 500 mL (0 mLs Intravenous Stopped 09/05/21 1134)  ondansetron (ZOFRAN) injection 4 mg (4 mg Intravenous Given 09/05/21 0841)  iohexol (OMNIPAQUE) 300 MG/ML solution 75 mL  (75 mLs Intravenous Contrast Given 09/05/21 0908)    ED Course/ Medical Decision Making/ A&P                           Medical Decision Making Problems Addressed: Right lower quadrant abdominal pain: acute illness or injury  Amount and/or Complexity of Data Reviewed Labs: ordered. Radiology: ordered and independent interpretation performed. Decision-making details documented in ED Course.  Risk Prescription drug management.   Patient presents with right-sided abdominal pain.  Had nausea  vomiting.  Felt better for about a week.  No diarrhea.  Had discussed with PCP.  Right-sided tenderness.  Lab work showed only mild hyperglycemia.  No leukocytosis.  CT scan independently interpreted and showed no appendicitis.  No colitis.  Discussed with patient.  Will discharge home with outpatient follow-up as needed.  Some symptomatic treatment. Initial differential diagnosis includes appendicitis, colitis, bowel obstruction.        Final Clinical Impression(s) / ED Diagnoses Final diagnoses:  Right lower quadrant abdominal pain    Rx / DC Orders ED Discharge Orders          Ordered    ondansetron (ZOFRAN-ODT) 4 MG disintegrating tablet  Every 8 hours PRN        09/05/21 1119              Davonna Belling, MD 09/05/21 1827

## 2021-09-15 ENCOUNTER — Ambulatory Visit (INDEPENDENT_AMBULATORY_CARE_PROVIDER_SITE_OTHER): Payer: Medicare Other | Admitting: Endocrinology

## 2021-09-15 ENCOUNTER — Other Ambulatory Visit: Payer: Self-pay

## 2021-09-15 VITALS — BP 112/70 | HR 94 | Ht 66.0 in | Wt 195.0 lb

## 2021-09-15 DIAGNOSIS — N529 Male erectile dysfunction, unspecified: Secondary | ICD-10-CM | POA: Diagnosis not present

## 2021-09-15 DIAGNOSIS — Z794 Long term (current) use of insulin: Secondary | ICD-10-CM | POA: Diagnosis not present

## 2021-09-15 DIAGNOSIS — E1140 Type 2 diabetes mellitus with diabetic neuropathy, unspecified: Secondary | ICD-10-CM | POA: Diagnosis not present

## 2021-09-15 LAB — POCT GLYCOSYLATED HEMOGLOBIN (HGB A1C): Hemoglobin A1C: 10.4 % — AB (ref 4.0–5.6)

## 2021-09-15 MED ORDER — ONETOUCH VERIO VI STRP: 1.0000 | ORAL_STRIP | Freq: Two times a day (BID) | 3 refills | Status: DC

## 2021-09-15 MED ORDER — SILDENAFIL CITRATE 100 MG PO TABS: 100.0000 mg | ORAL_TABLET | Freq: Every day | ORAL | 11 refills | Status: AC | PRN

## 2021-09-15 MED ORDER — TRESIBA FLEXTOUCH 200 UNIT/ML ~~LOC~~ SOPN: 54.0000 [IU] | PEN_INJECTOR | Freq: Every day | SUBCUTANEOUS | 3 refills | Status: DC

## 2021-09-15 MED ORDER — ONETOUCH VERIO IQ SYSTEM W/DEVICE KIT: 1.0000 | PACK | Freq: Once | 0 refills | Status: AC

## 2021-09-15 NOTE — Progress Notes (Signed)
? ?Subjective:  ? ? Patient ID: Calvin [name], male    DOB: April 17, 1961, 61 y.o.   MRN: [id number] ? ?HPI ?Pt returns for f/u of diabetes mellitus:  ?DM type: Insulin-requiring type 2. ?Dx'ed: 2009 ?Complications: PN, CAD, CRI, and DR.   ?Therapy: insulin since soon after dx, and Farxiga.   ?DKA: never ?Severe hypoglycemia: once, in 2021.   ?Pancreatitis: never ?Pancreatic imaging: normal on 2003 CT.   ?SDOH: due to noncompliance, he is not a candidate for multiple daily injections; he is retired; he gets insulin from pt assistance; changes between brands must be minimized, due to LHL.   ?Other: up to early 2021, he was taking more than 200 units QD; pt requests that insulin be titrated up very slowly, due to h/o severe hypoglycemia.   ?Interval history: no cbg record, but states cbg's vary from 210-400.  Pt says he never misses the insulin.  Pt says he does not know why A1c is higher today.  He takes 48 units qam.   ?Past Medical History:  ?Diagnosis Date  ? Adenoidal hypertrophy   ? Adrenal nodule 09/21/2020  ? AF (paroxysmal atrial fibrillation) 03/10/2018  ? AKI (acute kidney injury) 06/17/2019  ? Arthritis   ? Cataract   ? Mixed OU  ? Chronic anticoagulation 10/15/2019  ? Chronic systolic heart failure 80/32/1224  ? Coronary artery disease   ? a. cath in 08/2017 showing mild nonobstructive CAD with scattered 20-30% stenosis.   ? Diabetic neuropathy 09/26/2017  ? Diabetic retinopathy   ? NPDR OU  ? Essential hypertension 11/22/2015  ? Foot pain, bilateral 02/01/2019  ? Gastroesophageal reflux disease 05/10/2017  ? Heart attack   ? [place] living [place]  ? History of adenomatous colonic polyps 05/19/2011  ? 05/2011 9 adenomas 08/2017 5 adenomas (dimin) Recall 2022  ? History of COVID-19 06/18/2019  ? HLD (hyperlipidemia) 11/20/2015  ? Hypoglycemia due to insulin 10/15/2019  ? Insomnia 05/10/2017  ? Intermittent claudication 05/25/2020  ? Mild neurocognitive disorder due to Alzheimer's disease, possible  08/24/2021  ? Nonischemic cardiomyopathy   ? a. EF 20-25% by echo in 09/2017 with cath showing mild CAD. b.  Last echo 12/2017 EF 35-40%, grade 2 DD.  ? Obesity   ? Onychomycosis of multiple toenails with type 2 diabetes mellitus and peripheral neuropathy 05/10/2017  ? OSA on CPAP 05/10/2017  ? Right sided weakness 06/17/2019  ? Type II diabetes mellitus 11/20/2015  ? Unsteady gait 12/20/2018  ? ? ?Past Surgical History:  ?Procedure Laterality Date  ? COLONOSCOPY  05/13/11, 08/2017  ? 9 adenomas  ? FOREARM SURGERY    ? MUSCLE BIOPSY    ? POLYPECTOMY    ? RIGHT/LEFT HEART CATH AND CORONARY ANGIOGRAPHY N/A 08/25/2017  ? Procedure: RIGHT/LEFT HEART CATH AND CORONARY ANGIOGRAPHY;  Surgeon: [name], MD;  Location: [place] [facility];  Service: Cardiovascular;  Laterality: N/A;  ? RIGHT/LEFT HEART CATH AND CORONARY ANGIOGRAPHY N/A 10/01/2019  ? Procedure: RIGHT/LEFT HEART CATH AND CORONARY ANGIOGRAPHY;  Surgeon: [name], MD;  Location: [place] [facility];  Service: Cardiovascular;  Laterality: N/A;  ? RIGHT/LEFT HEART CATH AND CORONARY ANGIOGRAPHY N/A 04/07/2021  ? Procedure: RIGHT/LEFT HEART CATH AND CORONARY ANGIOGRAPHY;  Surgeon: [name], MD;  Location: [place] [facility];  Service: Cardiovascular;  Laterality: N/A;  ? ? ?Social History  ? ?Socioeconomic History  ? Marital status: Married  ?  Spouse name: Not on file  ? Number of children: 0  ?  Years of education: 98  ? Highest education level: Some college, no degree  ?Occupational History  ? Occupation: Disability  ?Tobacco Use  ? Smoking status: Never  ? Smokeless tobacco: Never  ?Vaping Use  ? Vaping Use: Never used  ?Substance and Sexual Activity  ? Alcohol use: No  ? Drug use: No  ? Sexual activity: Yes  ?  Birth control/protection: None  ?Other Topics Concern  ? Not on file  ?Social History Narrative  ? Social History  ? Diet?   ? Do you drink/eat things with caffeine? yes  ? Marital status?       single     ? Do you live in a  house, apartment, assisted living, condo, trailer, etc.? yes  ? Is it one or more stories? One story  ? How many persons live in your home?  ? Do you have any pets in your home? (please list)   ? Highest level of education completed? graduate  ? Do you exercise?            no                          Type & how often?  ? Advanced Directives  ? Do you have a living will? no  ? Do you have a DNR form?                                  If not, do you want to discuss one? no  ? Do you have signed POA/HPOA for forms? no  ?   ? Functional Status  ? Do you have difficulty bathing or dressing yourself? no  ? Do you have difficulty preparing food or eating? no  ? Do you have difficulty managing your medications? no  ? Do you have difficulty managing your finances? no  ? Do you have difficulty affording your medications? no  ? ?Social Determinants of Health  ? ?Financial Resource Strain: Not on file  ?Food Insecurity: No Food Insecurity  ? Worried About Charity fundraiser in the Last Year: Never true  ? Ran Out of Food in the Last Year: Never true  ?Transportation Needs: No Transportation Needs  ? Lack of Transportation (Medical): No  ? Lack of Transportation (Non-Medical): No  ?Physical Activity: Not on file  ?Stress: Not on file  ?Social Connections: Not on file  ?Intimate Partner Violence: Not on file  ? ? ?Current Outpatient Medications on File Prior to Visit  ?Medication Sig Dispense Refill  ? albuterol (VENTOLIN HFA) 108 (90 Base) MCG/ACT inhaler Inhale 2 puffs into the lungs every 6 (six) hours as needed for wheezing or shortness of breath. 6.7 g 0  ? amitriptyline (ELAVIL) 25 MG tablet Take 25 mg by mouth at bedtime.    ? atorvastatin (LIPITOR) 80 MG tablet TAKE ONE TABLET BY MOUTH EVERYDAY AT BEDTIME (Patient taking differently: Take 80 mg by mouth at bedtime.) 90 tablet 2  ? bisacodyl (DULCOLAX) 5 MG EC tablet Take 2 tablets (10 mg total) by mouth every other day. Take at bedtime 30 tablet 0  ? carvedilol (COREG)  3.125 MG tablet Take 1 tablet (3.125 mg total) by mouth 2 (two) times daily with a meal. 60 tablet 10  ? donepezil (ARICEPT) 10 MG tablet Take 1 tablet (10 mg total) by mouth at bedtime. 30 tablet 11  ?  ELIQUIS 5 MG TABS tablet TAKE ONE TABLET BY MOUTH AT BREAKFAST AND AT BEDTIME (Patient taking differently: Take 5 mg by mouth 2 (two) times daily.) 60 tablet 3  ? ENTRESTO 24-26 MG Take 1 tablet by mouth at bedtime.    ? ezetimibe (ZETIA) 10 MG tablet TAKE ONE TABLET BY MOUTH EVERY MORNING (Patient taking differently: Take 10 mg by mouth daily.) 30 tablet 11  ? FARXIGA 10 MG TABS tablet TAKE ONE TABLET BY MOUTH EVERY MORNING (Patient taking differently: Take 10 mg by mouth daily.) 90 tablet 1  ? gabapentin (NEURONTIN) 100 MG capsule TAKE ONE CAPSULE BY MOUTH EVERY MORNING and TAKE ONE CAPSULE BY MOUTH AT NOON and TAKE ONE CAPSULE BY MOUTH EVERYDAY AT BEDTIME (Patient taking differently: Take 100 mg by mouth 3 (three) times daily.) 270 capsule 0  ? Insulin Syringe-Needle U-100 (INSULIN SYRINGE .3CC/29GX1/2") 29G X 1/2" 0.3 ML MISC Inject 1 Syringe 3 (three) times daily as directed. Check blood sugar three times daily. Dx: E11.9 100 each 3  ? Lancets (ONETOUCH DELICA PLUS [id number]) MISC USE TWICE DAILY 200 each 3  ? nitroGLYCERIN (NITROSTAT) 0.4 MG SL tablet Place 1 tablet (0.4 mg total) under the tongue every 5 (five) minutes as needed for chest pain. 30 tablet 1  ? ondansetron (ZOFRAN-ODT) 4 MG disintegrating tablet Take 1 tablet (4 mg total) by mouth every 8 (eight) hours as needed for nausea or vomiting. 8 tablet 0  ? pantoprazole (PROTONIX) 40 MG tablet TAKE ONE TABLET BY MOUTH ONCE DAILY (Patient taking differently: Take 40 mg by mouth daily.) 90 tablet 3  ? polyethylene glycol (MIRALAX / GLYCOLAX) 17 g packet Take 17 g by mouth 2 (two) times daily. 14 each 0  ? REPATHA 140 MG/ML SOSY INJECT 1 PEN INTO THE SKIN EVERY 14 DAYS (Patient taking differently: Inject 140 mg as directed every 14 (fourteen) days.) 6 mL 3   ? rosuvastatin (CRESTOR) 40 MG tablet Take 40 mg by mouth at bedtime.    ? spironolactone (ALDACTONE) 25 MG tablet Take 1 tablet (25 mg total) by mouth daily. 90 tablet 3  ? losartan (COZAAR) 25 MG tablet Take

## 2021-09-15 NOTE — Patient Instructions (Addendum)
I have sent a prescription to your pharmacy, to increase the insulin to 54 units daily. ?Please continue the same [place].   ?On this type of insulin, you should eat meals on a regular schedule.  If a meal is missed or significantly delayed, your blood sugar could go low.   ?check your blood sugar twice a day.  vary the time of day when you check, between before the 3 meals, and at bedtime.  also check if you have symptoms of your blood sugar being too high or too low.  please keep a record of the readings and bring it to your next appointment here (or you can bring the meter itself).  You can write it on any piece of paper.  please call us sooner if your blood sugar goes below 70, or if you have a lot of readings over 200.   ?Blood tests are requested for you today.  We'll let you know about the results.   ?I have sent a prescription to your pharmacy, for Viagra.   ?Please come back for a follow-up appointment in 2 months.   ? ?

## 2021-09-17 ENCOUNTER — Telehealth: Payer: Self-pay

## 2021-09-17 ENCOUNTER — Other Ambulatory Visit: Payer: Self-pay

## 2021-09-17 ENCOUNTER — Ambulatory Visit: Payer: Medicare Other | Admitting: Physician Assistant

## 2021-09-17 NOTE — Telephone Encounter (Signed)
LV[facility] pt requesting call back to discuss next PREP class starting 09/27/21 at [facility]  ?

## 2021-09-20 ENCOUNTER — Encounter: Payer: Self-pay | Admitting: Physician Assistant

## 2021-09-20 ENCOUNTER — Ambulatory Visit (INDEPENDENT_AMBULATORY_CARE_PROVIDER_SITE_OTHER): Payer: Medicare Other | Admitting: Physician Assistant

## 2021-09-20 ENCOUNTER — Other Ambulatory Visit: Payer: Self-pay

## 2021-09-20 VITALS — BP 121/65 | HR 102 | Resp 18 | Ht 68.0 in | Wt 192.0 lb

## 2021-09-20 DIAGNOSIS — F0670 Mild neurocognitive disorder due to known physiological condition without behavioral disturbance: Secondary | ICD-10-CM

## 2021-09-20 DIAGNOSIS — G309 Alzheimer's disease, unspecified: Secondary | ICD-10-CM

## 2021-09-20 DIAGNOSIS — R4189 Other symptoms and signs involving cognitive functions and awareness: Secondary | ICD-10-CM

## 2021-09-20 NOTE — Patient Instructions (Addendum)
It was a pleasure to see you today at our office.  ? ?Recommendations: ? ?Follow up in 6  months ?Continue donepezil 10 mg daily. Side effects were discussed  ?MRI brain  ?Follow up with your primary doctor for joint pain may need Orthopedics.  ? ? ?RECOMMENDATIONS FOR ALL PATIENTS WITH MEMORY PROBLEMS: ?1. Continue to exercise (Recommend 30 minutes of walking everyday, or 3 hours every week) ?2. Increase social interactions - continue going to [place] and enjoy social gatherings with friends and family ?3. Eat healthy, avoid fried foods and eat more fruits and vegetables ?4. Maintain adequate blood pressure, blood sugar, and blood cholesterol level. Reducing the risk of stroke and cardiovascular disease also helps promoting better memory. ?5. Avoid stressful situations. Live a simple life and avoid aggravations. Organize your time and prepare for the next day in anticipation. ?6. Sleep well, avoid any interruptions of sleep and avoid any distractions in the bedroom that may interfere with adequate sleep quality ?7. Avoid sugar, avoid sweets as there is a strong link between excessive sugar intake, diabetes, and cognitive impairment ?We discussed the Mediterranean diet, which has been shown to help patients reduce the risk of progressive memory disorders and reduces cardiovascular risk. This includes eating fish, eat fruits and green leafy vegetables, nuts like almonds and hazelnuts, walnuts, and also use olive oil. Avoid fast foods and fried foods as much as possible. Avoid sweets and sugar as sugar use has been linked to worsening of memory function. ? ?There is always a concern of gradual progression of memory problems. If this is the case, then we may need to adjust level of care according to patient needs. Support, both to the patient and caregiver, should then be put into place.  ? ? ?FALL PRECAUTIONS: Be cautious when walking. Scan the area for obstacles that may increase the risk of trips and falls. When  getting up in the mornings, sit up at the edge of the bed for a few minutes before getting out of bed. Consider elevating the bed at the head end to avoid drop of blood pressure when getting up. Walk always in a well-lit room (use night lights in the walls). Avoid area rugs or power cords from appliances in the middle of the walkways. Use a walker or a cane if necessary and consider physical therapy for balance exercise. Get your eyesight checked regularly. ? ?FINANCIAL OVERSIGHT: Supervision, especially oversight when making financial decisions or transactions is also recommended. ? ?HOME SAFETY: Consider the safety of the kitchen when operating appliances like stoves, microwave oven, and blender. Consider having supervision and share cooking responsibilities until no longer able to participate in those. Accidents with firearms and other hazards in the house should be identified and addressed as well. ? ? ?ABILITY TO BE LEFT ALONE: If patient is unable to contact 911 operator, consider using LifeLine, or when the need is there, arrange for someone to stay with patients. Smoking is a fire hazard, consider supervision or cessation. Risk of wandering should be assessed by caregiver and if detected at any point, supervision and safe proof recommendations should be instituted. ? ?MEDICATION SUPERVISION: Inability to self-administer medication needs to be constantly addressed. Implement a mechanism to ensure safe administration of the medications. ? ? ?DRIVING: Regarding driving, in patients with progressive memory problems, driving will be impaired. We advise to have someone else do the driving if trouble finding directions or if minor accidents are reported. Independent driving assessment is available to determine safety of  driving. ? ? ?If you are interested in the driving assessment, you can contact the following: ? ?[facility] [employer] [facility] in [place] ? ?[facility]  [phone] ? ?[facility] [phone] ? ?[facility] [phone] or [phone] ?  ? ?We have sent a referral to [facility] for your MRI and they will call you directly to schedule your appointment. They are located at [place]. If you need to contact them directly please call [phone].  ?
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
## 2021-09-21 LAB — TESTOSTERONE,FREE AND TOTAL
Testosterone, Free: 10.7 pg/mL (ref 6.6–18.1)
Testosterone: 242 ng/dL — ABNORMAL LOW (ref 264–916)
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
## 2021-09-24 ENCOUNTER — Telehealth: Payer: Self-pay

## 2021-09-24 NOTE — Telephone Encounter (Signed)
Call from pt earlier today ?Ready to start PREP ?Offered next PREP at [place] afternoon 10/18/21 1p-215pm x 12 wks ?He is agreeable ?Will call him closer to start of class to complete intake and measurements  ?

## 2021-09-27 ENCOUNTER — Encounter (INDEPENDENT_AMBULATORY_CARE_PROVIDER_SITE_OTHER): Payer: Self-pay | Admitting: Ophthalmology

## 2021-09-27 ENCOUNTER — Other Ambulatory Visit: Payer: Self-pay

## 2021-09-27 ENCOUNTER — Ambulatory Visit (INDEPENDENT_AMBULATORY_CARE_PROVIDER_SITE_OTHER): Payer: Medicare Other | Admitting: Ophthalmology

## 2021-09-27 VITALS — BP 155/93 | HR 87

## 2021-09-27 DIAGNOSIS — H35033 Hypertensive retinopathy, bilateral: Secondary | ICD-10-CM

## 2021-09-27 DIAGNOSIS — I10 Essential (primary) hypertension: Secondary | ICD-10-CM

## 2021-09-27 DIAGNOSIS — H25813 Combined forms of age-related cataract, bilateral: Secondary | ICD-10-CM | POA: Diagnosis not present

## 2021-09-27 DIAGNOSIS — E113413 Type 2 diabetes mellitus with severe nonproliferative diabetic retinopathy with macular edema, bilateral: Secondary | ICD-10-CM

## 2021-09-27 MED ORDER — BEVACIZUMAB CHEMO INJECTION 1.25MG/0.05ML SYRINGE FOR KALEIDOSCOPE
1.2500 mg | INTRAVITREAL | Status: AC | PRN
  Administered 2021-09-27: 1.25 mg via INTRAVITREAL

## 2021-09-30 ENCOUNTER — Encounter (HOSPITAL_COMMUNITY): Payer: Self-pay | Admitting: Internal Medicine

## 2021-09-30 ENCOUNTER — Telehealth (HOSPITAL_COMMUNITY): Payer: Self-pay

## 2021-09-30 ENCOUNTER — Ambulatory Visit (HOSPITAL_COMMUNITY)
Admission: RE | Admit: 2021-09-30 | Discharge: 2021-09-30 | Disposition: A | Discharge status: Home / Self Care | Payer: Medicare Other | Source: Ambulatory Visit | Attending: Internal Medicine | Admitting: Internal Medicine

## 2021-09-30 VITALS — BP 140/80 | HR 98 | Wt 194.2 lb

## 2021-09-30 DIAGNOSIS — E1151 Type 2 diabetes mellitus with diabetic peripheral angiopathy without gangrene: Secondary | ICD-10-CM | POA: Insufficient documentation

## 2021-09-30 DIAGNOSIS — E785 Hyperlipidemia, unspecified: Secondary | ICD-10-CM | POA: Diagnosis not present

## 2021-09-30 DIAGNOSIS — I480 Paroxysmal atrial fibrillation: Secondary | ICD-10-CM

## 2021-09-30 DIAGNOSIS — I5042 Chronic combined systolic (congestive) and diastolic (congestive) heart failure: Secondary | ICD-10-CM | POA: Insufficient documentation

## 2021-09-30 DIAGNOSIS — Z7901 Long term (current) use of anticoagulants: Secondary | ICD-10-CM | POA: Diagnosis not present

## 2021-09-30 DIAGNOSIS — Z8616 Personal history of COVID-19: Secondary | ICD-10-CM | POA: Insufficient documentation

## 2021-09-30 DIAGNOSIS — Z79899 Other long term (current) drug therapy: Secondary | ICD-10-CM | POA: Insufficient documentation

## 2021-09-30 DIAGNOSIS — I10 Essential (primary) hypertension: Secondary | ICD-10-CM

## 2021-09-30 DIAGNOSIS — K219 Gastro-esophageal reflux disease without esophagitis: Secondary | ICD-10-CM | POA: Insufficient documentation

## 2021-09-30 DIAGNOSIS — Z09 Encounter for follow-up examination after completed treatment for conditions other than malignant neoplasm: Secondary | ICD-10-CM | POA: Diagnosis not present

## 2021-09-30 DIAGNOSIS — I5022 Chronic systolic (congestive) heart failure: Secondary | ICD-10-CM

## 2021-09-30 DIAGNOSIS — M549 Dorsalgia, unspecified: Secondary | ICD-10-CM | POA: Diagnosis not present

## 2021-09-30 DIAGNOSIS — E1142 Type 2 diabetes mellitus with diabetic polyneuropathy: Secondary | ICD-10-CM | POA: Insufficient documentation

## 2021-09-30 DIAGNOSIS — I252 Old myocardial infarction: Secondary | ICD-10-CM | POA: Insufficient documentation

## 2021-09-30 DIAGNOSIS — Z7984 Long term (current) use of oral hypoglycemic drugs: Secondary | ICD-10-CM | POA: Diagnosis not present

## 2021-09-30 DIAGNOSIS — I110 Hypertensive heart disease with heart failure: Secondary | ICD-10-CM | POA: Insufficient documentation

## 2021-09-30 DIAGNOSIS — I2510 Atherosclerotic heart disease of native coronary artery without angina pectoris: Secondary | ICD-10-CM | POA: Insufficient documentation

## 2021-09-30 DIAGNOSIS — R0609 Other forms of dyspnea: Secondary | ICD-10-CM | POA: Insufficient documentation

## 2021-09-30 DIAGNOSIS — I428 Other cardiomyopathies: Secondary | ICD-10-CM | POA: Diagnosis not present

## 2021-09-30 LAB — BASIC METABOLIC PANEL
Anion gap: 9 (ref 5–15)
BUN: 25 mg/dL — ABNORMAL HIGH (ref 8–23)
CO2: 22 mmol/L (ref 22–32)
Calcium: 8.9 mg/dL (ref 8.9–10.3)
Chloride: 104 mmol/L (ref 98–111)
Creatinine, Ser: 1.75 mg/dL — ABNORMAL HIGH (ref 0.61–1.24)
GFR, Estimated: 44 mL/min — ABNORMAL LOW (ref >60)
Glucose, Bld: 267 mg/dL — ABNORMAL HIGH (ref 70–99)
Potassium: 4.3 mmol/L (ref 3.5–5.1)
Sodium: 135 mmol/L (ref 135–145)

## 2021-09-30 LAB — BRAIN NATRIURETIC PEPTIDE: B Natriuretic Peptide: 29.1 pg/mL (ref 0.0–100.0)

## 2021-09-30 MED ORDER — LOSARTAN POTASSIUM 25 MG PO TABS: 25.0000 mg | ORAL_TABLET | Freq: Every day | ORAL | 3 refills | Status: DC
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
## 2021-09-30 NOTE — Addendum Note (Signed)
Encounter addended by: Scarlette Calico, RN on: 09/30/2021 12:42 PM ? Actions taken: Order list changed, Diagnosis association updated

## 2021-09-30 NOTE — Patient Instructions (Addendum)
Start Losartan 25 mg Daily, [facility] will get this to you ? ?Labs done today, we will call you for abnormal results ? ?Please follow up with our heart failure pharmacist in 1 week (10/07/21 at 2:30 PM) **PLEASE MAKE SURE TO BRING ALL OF YOUR MEDICATIONS WITH YOU TO THIS APPOINTMENT ? ?Your physician recommends that you schedule a follow-up appointment in: 4 months with an echocardiogram **PLEASE CALL OUR OFFICE IN MAY TO SCHEDULE THIS ? ?If you have any questions or concerns before your next appointment please send [place] a message through [place] or call our office at [phone].   ? ?TO LEAVE A MESSAGE FOR THE NURSE SELECT OPTION 2, PLEASE LEAVE A MESSAGE INCLUDING: ?YOUR NAME ?DATE OF BIRTH ?CALL BACK NUMBER ?REASON FOR CALL**this is important as we prioritize the call backs ? ?YOU WILL RECEIVE A CALL BACK THE SAME DAY AS LONG AS YOU CALL BEFORE 4:00 PM ? ?At the [facility], you and your health needs are our priority. As part of our continuing mission to provide you with exceptional heart care, we have created designated Provider Care Teams. These Care Teams include your primary Cardiologist (physician) and Advanced Practice Providers (APPs- Physician Assistants and Nurse Practitioners) who all work together to provide you with the care you need, when you need it.  ? ?You may see any of the following providers on your designated Care Team at your next follow up: ?Dr Glori Bickers ?Dr Loralie Champagne ?Darrick Grinder, NP ?Lyda Jester, PA ?Jessica Milford,NP ?Marlyce Huge, PA ?Audry Riles, PharmD ? ? ?Please be sure to bring in all your medications bottles to every appointment.  ? ? ?

## 2021-09-30 NOTE — Telephone Encounter (Addendum)
Pt aware, agreeable, and verbalized understanding ? ? ?----- Message from Conrad Edom, NP sent at 09/30/2021  2:37 PM EDT ----- ?Renal function stable. No change.  ? ?Needs f/u with PCP for diabetes management.  ?

## 2021-09-30 NOTE — Progress Notes (Signed)
Called and spoke w/[facility] regarding Calvin [name], they state they received order on 09/21/21 from Dr Manuella Ghazi (pcp) and on 3/22 they mailed him a bottle as his bubble packs had already been shipped to pt. Patient states he has never received a bottle of meds and only has his bubble takes, which is what he taking. New rx for losartan sent into [facility]. Pt sch for appt in pharmacy clinic on 10/07/21 at 2:30 and is aware to bring all medications with him ? ?Kevan Rosebush, RN, BSN, CHFN ?Specialty Coordinator ?[facility] ? ?

## 2021-10-07 ENCOUNTER — Other Ambulatory Visit: Payer: Self-pay | Admitting: Internal Medicine

## 2021-10-07 ENCOUNTER — Ambulatory Visit (HOSPITAL_COMMUNITY)
Admission: RE | Admit: 2021-10-07 | Discharge: 2021-10-07 | Disposition: A | Discharge status: Home / Self Care | Payer: Medicare Other | Source: Ambulatory Visit | Attending: Cardiology | Admitting: Cardiology

## 2021-10-07 VITALS — BP 152/98 | HR 90 | Wt 195.4 lb

## 2021-10-07 DIAGNOSIS — Z7984 Long term (current) use of oral hypoglycemic drugs: Secondary | ICD-10-CM | POA: Insufficient documentation

## 2021-10-07 DIAGNOSIS — I110 Hypertensive heart disease with heart failure: Secondary | ICD-10-CM | POA: Diagnosis not present

## 2021-10-07 DIAGNOSIS — E785 Hyperlipidemia, unspecified: Secondary | ICD-10-CM | POA: Diagnosis not present

## 2021-10-07 DIAGNOSIS — I252 Old myocardial infarction: Secondary | ICD-10-CM | POA: Insufficient documentation

## 2021-10-07 DIAGNOSIS — I480 Paroxysmal atrial fibrillation: Secondary | ICD-10-CM | POA: Insufficient documentation

## 2021-10-07 DIAGNOSIS — Z79899 Other long term (current) drug therapy: Secondary | ICD-10-CM | POA: Insufficient documentation

## 2021-10-07 DIAGNOSIS — I2510 Atherosclerotic heart disease of native coronary artery without angina pectoris: Secondary | ICD-10-CM | POA: Insufficient documentation

## 2021-10-07 DIAGNOSIS — E118 Type 2 diabetes mellitus with unspecified complications: Secondary | ICD-10-CM | POA: Insufficient documentation

## 2021-10-07 DIAGNOSIS — I5022 Chronic systolic (congestive) heart failure: Secondary | ICD-10-CM

## 2021-10-07 DIAGNOSIS — I428 Other cardiomyopathies: Secondary | ICD-10-CM | POA: Insufficient documentation

## 2021-10-07 DIAGNOSIS — I5042 Chronic combined systolic (congestive) and diastolic (congestive) heart failure: Secondary | ICD-10-CM | POA: Insufficient documentation

## 2021-10-07 DIAGNOSIS — Z7901 Long term (current) use of anticoagulants: Secondary | ICD-10-CM | POA: Diagnosis not present
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
## 2021-10-07 NOTE — Patient Instructions (Signed)
It was a pleasure seeing you today! ? ?MEDICATIONS: ?-No medication changes today ?-Stop taking Entresto if you still have the bottles. This has been replaced by losartan. ?-Call if you have questions about your medications. ? ? ?NEXT APPOINTMENT: ?Return to clinic in 2 weeks with Pharmacy clinic. ? ?In general, to take care of your heart failure: ?-Limit your fluid intake to 2 Liters (half-gallon) per day.   ?-Limit your salt intake to ideally 2-3 grams (2000-3000 mg) per day. ?-Weigh yourself daily and record, and bring that "weight diary" to your next appointment.  (Weight gain of 2-3 pounds in 1 day typically means fluid weight.) ?-The medications for your heart are to help your heart and help you live longer.   ?-Please contact us before stopping any of your heart medications. ? ?Call the clinic at [phone] with questions or to reschedule future appointments.  ?

## 2021-10-08 ENCOUNTER — Telehealth: Payer: Self-pay

## 2021-10-08 ENCOUNTER — Ambulatory Visit
Admission: RE | Admit: 2021-10-08 | Discharge: 2021-10-08 | Disposition: A | Discharge status: Home / Self Care | Payer: Medicare Other | Source: Ambulatory Visit | Attending: Physician Assistant | Admitting: Physician Assistant

## 2021-10-08 NOTE — Telephone Encounter (Signed)
Call to pt to schedule intake for PREP class starting on 4/17 at [facility] ?Scheduled for 4/11 at 4pm at [place]. Will meet pt in lobby.  ?

## 2021-10-11 NOTE — Progress Notes (Signed)
Please inform patient that MRI does not show anything acute, just some minimal chronic vessel changes, make sure he taks care of the cardiovascular risk factors. Thanks

## 2021-10-12 NOTE — Progress Notes (Signed)
[facility] PREP Evaluation ? ?Patient Details  ?Name: Calvin [name] ?MRN: [id number] ?Date of Birth: Aug 19, 1960 ?Age: 61 y.o. ?PCP: Roselee Nova, MD ? ?Vitals:  ? 10/12/21 1659  ?BP: 102/70  ?Pulse: 97  ?Weight: 194 lb 3.2 oz (88.1 kg)  ?Height: 5' 6"  (1.676 m)  ? ? ? [facility] Eval - 10/12/21 1700   ? ?  ? [facility] "PREP" Location  ? [facility] "PREP" Location [place]   ?  ? Referral   ? Referring Provider CHF clinic   ? Reason for referral Hypertension;Orthopedic;Diabetes   ?  ? Measurement  ? Waist Circumference 42.5 inches   ? Hip Circumference 42 inches   ? Body fat --   not calculated, will do at time of class  ?  ? Information for Trainer  ? Goals Lose weight, improve back pain   ? Current Exercise walks daily for an hour   ? Orthopedic Concerns back pain, limited ROM bil shoulders   ? Pertinent Medical History HTN, DM2,   ? Current Barriers pain   ? Restrictions/Precautions Diabetic snack before exercise;Fall risk;Assistive device   ? Medications that affect exercise Medication causing dizziness/drowsiness;Asthma inhaler   ?  ? Timed Up and Go (TUGS)  ? Timed Up and Go Moderate risk 10-12 seconds   ?  ? Mobility and Daily Activities  ? I find it easy to walk up or down two or more flights of stairs. 2   ? I have no trouble taking out the trash. 4   ? I do housework such as vacuuming and dusting on my own without difficulty. 4   ? I can easily lift a gallon of milk (8lbs). 4   ? I can easily walk a mile. 4   ? I have no trouble reaching into high cupboards or reaching down to pick up something from the floor. 2   ? I do not have trouble doing out-door work such as Armed forces logistics/support/administrative officer, raking leaves, or gardening. 1   ?  ? Mobility and Daily Activities  ? I feel younger than my age. 1   ? I feel independent. 4   ? I feel energetic. 4   ? I live an active life.  4   ? I feel strong. 2   ? I feel healthy. 1   ? I feel active as other people my age. 3   ?  ? How fit and strong are you.  ? Fit and Strong Total Score 40    ? ?  ?  ? ?  ? ?Past Medical History:  ?Diagnosis Date  ? Adenoidal hypertrophy   ? Adrenal nodule 09/21/2020  ? AF (paroxysmal atrial fibrillation) 03/10/2018  ? AKI (acute kidney injury) 06/17/2019  ? Arthritis   ? Cataract   ? Mixed OU  ? Chronic anticoagulation 10/15/2019  ? Chronic systolic heart failure 25/85/2778  ? Coronary artery disease   ? a. cath in 08/2017 showing mild nonobstructive CAD with scattered 20-30% stenosis.   ? Diabetic neuropathy 09/26/2017  ? Diabetic retinopathy   ? NPDR OU  ? Essential hypertension 11/22/2015  ? Foot pain, bilateral 02/01/2019  ? Gastroesophageal reflux disease 05/10/2017  ? Heart attack   ? While living in [place]  ? History of adenomatous colonic polyps 05/19/2011  ? 05/2011 9 adenomas 08/2017 5 adenomas (dimin) Recall 2022  ? History of COVID-19 06/18/2019  ? HLD (hyperlipidemia) 11/20/2015  ? Hypoglycemia due to insulin 10/15/2019  ?  Insomnia 05/10/2017  ? Intermittent claudication 05/25/2020  ? Mild neurocognitive disorder due to Alzheimer's disease, possible 08/24/2021  ? Nonischemic cardiomyopathy   ? a. EF 20-25% by echo in 09/2017 with cath showing mild CAD. b.  Last echo 12/2017 EF 35-40%, grade 2 DD.  ? Obesity   ? Onychomycosis of multiple toenails with type 2 diabetes mellitus and peripheral neuropathy 05/10/2017  ? OSA on CPAP 05/10/2017  ? Right sided weakness 06/17/2019  ? Type II diabetes mellitus 11/20/2015  ? Unsteady gait 12/20/2018  ? ?Past Surgical History:  ?Procedure Laterality Date  ? COLONOSCOPY  05/13/11, 08/2017  ? 9 adenomas  ? FOREARM SURGERY    ? MUSCLE BIOPSY    ? POLYPECTOMY    ? RIGHT/LEFT HEART CATH AND CORONARY ANGIOGRAPHY N/A 08/25/2017  ? Procedure: RIGHT/LEFT HEART CATH AND CORONARY ANGIOGRAPHY;  Surgeon: [name], MD;  Location: [facility];  Service: Cardiovascular;  Laterality: N/A;  ? RIGHT/LEFT HEART CATH AND CORONARY ANGIOGRAPHY N/A 10/01/2019  ? Procedure: RIGHT/LEFT HEART CATH AND CORONARY ANGIOGRAPHY;  Surgeon:  [name], MD;  Location: [facility];  Service: Cardiovascular;  Laterality: N/A;  ? RIGHT/LEFT HEART CATH AND CORONARY ANGIOGRAPHY N/A 04/07/2021  ? Procedure: RIGHT/LEFT HEART CATH AND CORONARY ANGIOGRAPHY;  Surgeon: [name], MD;  Location: [facility];  Service: Cardiovascular;  Laterality: N/A;  ? ?Social History  ? ?Tobacco Use  ?Smoking Status Never  ?Smokeless Tobacco Never  ? ? ?Barnett Hatter ?10/12/2021, 5:05 PM ? ? ?
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
## 2021-10-14 ENCOUNTER — Ambulatory Visit (HOSPITAL_COMMUNITY)
Admission: RE | Admit: 2021-10-14 | Discharge: 2021-10-14 | Disposition: A | Discharge status: Home / Self Care | Payer: Medicare Other | Source: Ambulatory Visit | Attending: Internal Medicine | Admitting: Internal Medicine

## 2021-10-14 VITALS — BP 138/82 | Wt 195.8 lb

## 2021-10-14 DIAGNOSIS — I252 Old myocardial infarction: Secondary | ICD-10-CM | POA: Diagnosis not present

## 2021-10-14 DIAGNOSIS — I428 Other cardiomyopathies: Secondary | ICD-10-CM | POA: Insufficient documentation

## 2021-10-14 DIAGNOSIS — K219 Gastro-esophageal reflux disease without esophagitis: Secondary | ICD-10-CM | POA: Insufficient documentation

## 2021-10-14 DIAGNOSIS — E119 Type 2 diabetes mellitus without complications: Secondary | ICD-10-CM | POA: Insufficient documentation

## 2021-10-14 DIAGNOSIS — I110 Hypertensive heart disease with heart failure: Secondary | ICD-10-CM | POA: Diagnosis present

## 2021-10-14 DIAGNOSIS — I5042 Chronic combined systolic (congestive) and diastolic (congestive) heart failure: Secondary | ICD-10-CM | POA: Insufficient documentation

## 2021-10-14 DIAGNOSIS — Z79899 Other long term (current) drug therapy: Secondary | ICD-10-CM | POA: Insufficient documentation

## 2021-10-14 DIAGNOSIS — Z7984 Long term (current) use of oral hypoglycemic drugs: Secondary | ICD-10-CM | POA: Insufficient documentation

## 2021-10-14 DIAGNOSIS — I480 Paroxysmal atrial fibrillation: Secondary | ICD-10-CM | POA: Insufficient documentation

## 2021-10-14 DIAGNOSIS — E785 Hyperlipidemia, unspecified: Secondary | ICD-10-CM | POA: Diagnosis not present

## 2021-10-14 DIAGNOSIS — I2510 Atherosclerotic heart disease of native coronary artery without angina pectoris: Secondary | ICD-10-CM | POA: Diagnosis not present

## 2021-10-14 DIAGNOSIS — I5022 Chronic systolic (congestive) heart failure: Secondary | ICD-10-CM

## 2021-10-14 DIAGNOSIS — Z7901 Long term (current) use of anticoagulants: Secondary | ICD-10-CM | POA: Insufficient documentation
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
## 2021-10-14 NOTE — Progress Notes (Signed)
During pharmacy visit today serious medication concerns identified with pt pillpacks he is receiving from pharmacy. ? ?PharmD referred pt to paramedicine for short term follow up to ensure med changes are completed appropriately for pt safety. ? ?Referral sent to paramedic who had previously seen pt for follow up ? ?Will continue to follow and assist as needed ? ?Jorge Ny, LCSW ?Clinical Social Worker ?Advanced Heart Failure Clinic ?Desk#: [phone] ?Cell#: [phone] ? ?

## 2021-10-14 NOTE — Addendum Note (Signed)
Encounter addended by: Jorge Ny, LCSW on: 10/14/2021 4:07 PM ? Actions taken: Clinical Note Signed

## 2021-10-14 NOTE — Patient Instructions (Signed)
It was a pleasure seeing you today! ? ?MEDICATIONS: ?-No medication changes today ?- We have fixed your pill packs and put your medications in a pill box. You will receive correct pill packs from [facility] next week. Joellen Jersey will be out next week to help make sure your medications are accurate.  ?-Call if you have questions about your medications. ? ?NEXT APPOINTMENT: ?Return to clinic in 3 months with Dr. Haroldine Laws. Call [phone] to schedule this appointment.  ? ?In general, to take care of your heart failure: ?-Limit your fluid intake to 2 Liters (half-gallon) per day.   ?-Limit your salt intake to ideally 2-3 grams (2000-3000 mg) per day. ?-Weigh yourself daily and record, and bring that "weight diary" to your next appointment.  (Weight gain of 2-3 pounds in 1 day typically means fluid weight.) ?-The medications for your heart are to help your heart and help you live longer.   ?-Please contact us before stopping any of your heart medications. ? ?Call the clinic at [phone] with questions or to reschedule future appointments.  ?

## 2021-10-18 ENCOUNTER — Other Ambulatory Visit (HOSPITAL_COMMUNITY): Payer: Self-pay
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
## 2021-10-19 NOTE — Progress Notes (Signed)
?[facility] Note ? ?10/25/2021 ? ?  ? ?CHIEF COMPLAINT ?Patient presents for Retina Follow Up ? ? ?HISTORY OF PRESENT ILLNESS: ?Calvin [name] is a 61 y.o. male who presents to the clinic today for:  ?HPI   ? ? Retina Follow Up   ?Patient presents with  Diabetic Retinopathy.  In both eyes.  Duration of 4 weeks.  Since onset it is stable.  I, the attending physician,  performed the HPI with the patient and updated documentation appropriately. ? ?  ?  ? ? Comments   ?4 week follow up NPDR OU- No changes or new problems with vision.   ?BS hasn't checked ?A1C 10.4 ? ?  ?  ?Last edited by [name], MD on 10/25/2021  1:18 PM.  ?  ? ? ?Referring physician: ?Arnoldo Hooker MD ?[address], [address] ?[place], [place] [address] ? ?HISTORICAL INFORMATION:  ? ?Selected notes from the [place] ?Referred by Dr. Mariane Duval ?LEE: 09/27/21 BCVA OD 20/80 OS 20/60-2 ?Ocular history: NPDR, ?Medical history: DM  ? ?CURRENT MEDICATIONS: ?No current outpatient medications on file. (Ophthalmic Drugs)  ? ?No current facility-administered medications for this visit. (Ophthalmic Drugs)  ? ?Current Outpatient Medications (Other)  ?Medication Sig  ? albuterol (VENTOLIN HFA) 108 (90 Base) MCG/ACT inhaler Inhale 2 puffs into the lungs every 6 (six) hours as needed for wheezing or shortness of breath.  ? amitriptyline (ELAVIL) 25 MG tablet Take 25 mg by mouth at bedtime.  ? bisacodyl (DULCOLAX) 5 MG EC tablet Take 2 tablets (10 mg total) by mouth every other day. Take at bedtime  ? carvedilol (COREG) 3.125 MG tablet Take 1 tablet (3.125 mg total) by mouth 2 (two) times daily with a meal.  ? donepezil (ARICEPT) 10 MG tablet Take 1 tablet (10 mg total) by mouth at bedtime.  ? ELIQUIS 5 MG TABS tablet TAKE ONE TABLET BY MOUTH EVERY MORNING and TAKE ONE TABLET BY MOUTH EVERYDAY AT BEDTIME  ? ezetimibe (ZETIA) 10 MG tablet TAKE ONE TABLET BY MOUTH EVERY MORNING  ? FARXIGA 10 MG TABS tablet TAKE ONE  TABLET BY MOUTH EVERY MORNING  ? gabapentin (NEURONTIN) 100 MG capsule TAKE ONE CAPSULE BY MOUTH EVERY MORNING and TAKE ONE CAPSULE BY MOUTH AT NOON and TAKE ONE CAPSULE BY MOUTH EVERYDAY AT BEDTIME  ? insulin degludec (TRESIBA FLEXTOUCH) 200 UNIT/ML FlexTouch Pen Inject 54 Units into the skin daily. And pen needles 1/day  ? losartan (COZAAR) 25 MG tablet Take 1 tablet (25 mg total) by mouth daily.  ? nitroGLYCERIN (NITROSTAT) 0.4 MG SL tablet Place 1 tablet (0.4 mg total) under the tongue every 5 (five) minutes as needed for chest pain.  ? ondansetron (ZOFRAN-ODT) 4 MG disintegrating tablet Take 1 tablet (4 mg total) by mouth every 8 (eight) hours as needed for nausea or vomiting.  ? pantoprazole (PROTONIX) 40 MG tablet TAKE ONE TABLET BY MOUTH ONCE DAILY  ? polyethylene glycol (MIRALAX / GLYCOLAX) 17 g packet Take 17 g by mouth 2 (two) times daily.  ? REPATHA 140 MG/ML SOSY INJECT 1 PEN INTO THE SKIN EVERY 14 DAYS  ? rosuvastatin (CRESTOR) 40 MG tablet Take 40 mg by mouth at bedtime.  ? sildenafil (VIAGRA) 100 MG tablet Take 1 tablet (100 mg total) by mouth daily as needed for erectile dysfunction.  ? spironolactone (ALDACTONE) 25 MG tablet Take 1 tablet (25 mg total) by mouth daily.  ? glucose blood (ONETOUCH VERIO) test strip 1  each by Other route 2 (two) times daily. And lancets 2/day  ? Insulin Syringe-Needle U-100 (INSULIN SYRINGE .3CC/29GX1/2") 29G X 1/2" 0.3 ML MISC Inject 1 Syringe 3 (three) times daily as directed. Check blood sugar three times daily. Dx: E11.9  ? Lancets (ONETOUCH DELICA PLUS [id number]) MISC USE TWICE DAILY  ? ?No current facility-administered medications for this visit. (Other)  ? ?REVIEW OF SYSTEMS: ?ROS   ?Positive for: Gastrointestinal, Neurological, Musculoskeletal, Endocrine, Cardiovascular, Eyes, Respiratory ?Negative for: Constitutional, Skin, Genitourinary, HENT, Psychiatric, Allergic/Imm, Heme/Lymph ?Last edited by Leonie Douglas, COA on 10/25/2021  9:26 AM.  ?  ? ?ALLERGIES ?No  Known Allergies ? ?PAST MEDICAL HISTORY ?Past Medical History:  ?Diagnosis Date  ? Adenoidal hypertrophy   ? Adrenal nodule 09/21/2020  ? AF (paroxysmal atrial fibrillation) 03/10/2018  ? AKI (acute kidney injury) 06/17/2019  ? Arthritis   ? Cataract   ? Mixed OU  ? Chronic anticoagulation 10/15/2019  ? Chronic systolic heart failure 01/77/9390  ? Coronary artery disease   ? a. cath in 08/2017 showing mild nonobstructive CAD with scattered 20-30% stenosis.   ? Diabetic neuropathy 09/26/2017  ? Diabetic retinopathy   ? NPDR OU  ? Essential hypertension 11/22/2015  ? Foot pain, bilateral 02/01/2019  ? Gastroesophageal reflux disease 05/10/2017  ? Heart attack   ? While living in [place]  ? History of adenomatous colonic polyps 05/19/2011  ? 05/2011 9 adenomas 08/2017 5 adenomas (dimin) Recall 2022  ? History of COVID-19 06/18/2019  ? HLD (hyperlipidemia) 11/20/2015  ? Hypoglycemia due to insulin 10/15/2019  ? Insomnia 05/10/2017  ? Intermittent claudication 05/25/2020  ? Mild neurocognitive disorder due to Alzheimer's disease, possible 08/24/2021  ? Nonischemic cardiomyopathy   ? a. EF 20-25% by echo in 09/2017 with cath showing mild CAD. b.  Last echo 12/2017 EF 35-40%, grade 2 DD.  ? Obesity   ? Onychomycosis of multiple toenails with type 2 diabetes mellitus and peripheral neuropathy 05/10/2017  ? OSA on CPAP 05/10/2017  ? Right sided weakness 06/17/2019  ? Type II diabetes mellitus 11/20/2015  ? Unsteady gait 12/20/2018  ? ?Past Surgical History:  ?Procedure Laterality Date  ? COLONOSCOPY  05/13/11, 08/2017  ? 9 adenomas  ? FOREARM SURGERY    ? MUSCLE BIOPSY    ? POLYPECTOMY    ? RIGHT/LEFT HEART CATH AND CORONARY ANGIOGRAPHY N/A 08/25/2017  ? Procedure: RIGHT/LEFT HEART CATH AND CORONARY ANGIOGRAPHY;  Surgeon: [name], MD;  Location: [facility];  Service: Cardiovascular;  Laterality: N/A;  ? RIGHT/LEFT HEART CATH AND CORONARY ANGIOGRAPHY N/A 10/01/2019  ? Procedure: RIGHT/LEFT HEART CATH AND  CORONARY ANGIOGRAPHY;  Surgeon: [name], MD;  Location: [facility];  Service: Cardiovascular;  Laterality: N/A;  ? RIGHT/LEFT HEART CATH AND CORONARY ANGIOGRAPHY N/A 04/07/2021  ? Procedure: RIGHT/LEFT HEART CATH AND CORONARY ANGIOGRAPHY;  Surgeon: [name], MD;  Location: [facility];  Service: Cardiovascular;  Laterality: N/A;  ? ?FAMILY HISTORY ?Family History  ?Problem Relation Age of Onset  ? Heart disease Mother   ? Heart attack Mother 61  ? Hypertension Mother   ? Hyperlipidemia Mother   ? Diabetes Mother   ? Heart disease Father   ? Heart attack Father 49  ? Hypertension Father   ? Hyperlipidemia Father   ? Diabetes Father   ? Heart attack Sister 74  ? Colon cancer Neg Hx   ? Stomach cancer Neg Hx   ? Esophageal cancer Neg Hx   ?  Rectal cancer Neg Hx   ? Colon polyps Neg Hx   ? ?SOCIAL HISTORY ?Social History  ? ?Tobacco Use  ? Smoking status: Never  ? Smokeless tobacco: Never  ?Vaping Use  ? Vaping Use: Never used  ?Substance Use Topics  ? Alcohol use: No  ? Drug use: No  ?  ? ?  ?OPHTHALMIC EXAM: ? ?Base Eye Exam   ? ? Visual Acuity (Snellen - Linear)   ? ?   Right Left  ? Dist [place] 20/70- 20/150 +1  ? Dist ph [place] 20/60 +1 20/60 +1  ? ?  ?  ? ? Tonometry (Tonopen, 9:33 AM)   ? ?   Right Left  ? Pressure 14 13  ? ?  ?  ? ? Pupils   ? ?   Dark Light Shape React APD  ? Right 3 2 Round Brisk None  ? Left 3 2 Round Brisk None  ? ?  ?  ? ? Visual Fields (Counting fingers)   ? ?   Left Right  ?  Full Full  ? ?  ?  ? ? Extraocular Movement   ? ?   Right Left  ?  Full Full  ? ?  ?  ? ? Neuro/Psych   ? ? Oriented x3: Yes  ? Mood/Affect: Normal  ? ?  ?  ? ? Dilation   ? ? Both eyes: 1.0% Mydriacyl, 2.5% Phenylephrine @ 9:33 AM  ? ?  ?  ? ?  ? ?Slit Lamp and Fundus Exam   ? ? Slit Lamp Exam   ? ?   Right Left  ? Lids/Lashes Dermatochalasis - upper lid, Meibomian gland dysfunction Dermatochalasis - upper lid, Meibomian gland dysfunction  ? Conjunctiva/Sclera nasal and temporal  pinguecula, Melanosis nasal and temporal pinguecula, Melanosis  ? Cornea arcus arcus, 2+ inferior Punctate epithelial erosions  ? Anterior Chamber deep, clear, narrow temporal angle Deep and quiet  ? Iris Round and dila

## 2021-10-25 ENCOUNTER — Encounter (INDEPENDENT_AMBULATORY_CARE_PROVIDER_SITE_OTHER): Payer: Self-pay | Admitting: Ophthalmology

## 2021-10-25 ENCOUNTER — Ambulatory Visit (INDEPENDENT_AMBULATORY_CARE_PROVIDER_SITE_OTHER): Payer: Medicare Other | Admitting: Ophthalmology

## 2021-10-25 ENCOUNTER — Telehealth (HOSPITAL_COMMUNITY): Payer: Self-pay

## 2021-10-25 DIAGNOSIS — I10 Essential (primary) hypertension: Secondary | ICD-10-CM

## 2021-10-25 DIAGNOSIS — H25813 Combined forms of age-related cataract, bilateral: Secondary | ICD-10-CM | POA: Diagnosis not present

## 2021-10-25 DIAGNOSIS — E113413 Type 2 diabetes mellitus with severe nonproliferative diabetic retinopathy with macular edema, bilateral: Secondary | ICD-10-CM

## 2021-10-25 DIAGNOSIS — H35033 Hypertensive retinopathy, bilateral: Secondary | ICD-10-CM

## 2021-10-25 MED ORDER — BEVACIZUMAB CHEMO INJECTION 1.25MG/0.05ML SYRINGE FOR KALEIDOSCOPE
1.2500 mg | INTRAVITREAL | Status: AC | PRN
  Administered 2021-10-25: 1.25 mg via INTRAVITREAL

## 2021-10-26 NOTE — Telephone Encounter (Signed)
I called pt to come by early to check bubble packs that was delivered on Friday.  ?He said he was at eye doctor and would call me when he gets back home.  ? ?Calvin [name], [place]  ?[phone] ?10/25/2021 ? ?

## 2021-10-28 ENCOUNTER — Other Ambulatory Visit (HOSPITAL_COMMUNITY): Payer: Self-pay

## 2021-10-28 NOTE — Progress Notes (Addendum)
Paramedicine Encounter ? ? ? Patient ID: Calvin [name], male    DOB: 1960-10-14, 61 y.o.   MRN: [id number] ? ?Came out today to check pill packs- everything is correct-he is not taking anything out of any bottles now-it all is included in pill packs.  ?He did report he has been taking the breakfast and lunch together-the lunch time was his gabapentin so I advised him to not take it with the breakfast time meds as it had gabapentin in it too so he was double dosing. He said during day he has felt sleepy and tired. ? ?B/p elevated-just took meds when I arrived this morning.  ?Will f/u in a couple wks.  ? ?BP (!) 142/84   Pulse 94   Resp 18   Wt 196 lb (88.9 kg)   SpO2 98%   BMI 31.64 kg/m?  ?Weight yesterday-196 ?Last visit weight-198 ? ?Patient Care Team: ?Roselee Nova, MD as PCP - General (Family Medicine) ?[name], MD as PCP - Cardiology (Cardiology) ?[name], [name], MD as PCP - Advanced Heart Failure (Cardiology) ?[name], MD as Consulting Physician (Endocrinology) ?Jorge Ny, LCSW as Education officer, museum (Licensed Holiday representative) ?Pleasant, Eppie Gibson, RN as [place] Management ?Elease Hashimoto (Neurology) ? ?Patient Active Problem List  ? Diagnosis Date Noted  ? ED (erectile dysfunction) 09/15/2021  ? Mild neurocognitive disorder due to Alzheimer's disease, possible 08/24/2021  ? Diabetic retinopathy 08/24/2021  ? Heart attack 08/24/2021  ? Nonischemic cardiomyopathy 08/24/2021  ? Coronary artery disease 08/24/2021  ? Adrenal nodule 09/21/2020  ? Intermittent claudication 05/25/2020  ? Hypoglycemia due to insulin 10/15/2019  ? Chronic anticoagulation 10/15/2019  ? Right sided weakness 06/17/2019  ? Foot pain, bilateral 02/01/2019  ? Unsteady gait 12/20/2018  ? AF (paroxysmal atrial fibrillation) 03/10/2018  ? Diabetic neuropathy 09/26/2017  ? OSA on CPAP 05/10/2017  ? Gastroesophageal reflux disease 05/10/2017  ? Insomnia 05/10/2017  ?  Onychomycosis of multiple toenails with type 2 diabetes mellitus and peripheral neuropathy 05/10/2017  ? Chronic systolic heart failure 62/13/0865  ? Essential hypertension 11/22/2015  ? Type II diabetes mellitus 11/20/2015  ? HLD (hyperlipidemia) 11/20/2015  ? History of adenomatous colonic polyps 05/19/2011  ? ? ?Current Outpatient Medications:  ?  albuterol (VENTOLIN HFA) 108 (90 Base) MCG/ACT inhaler, Inhale 2 puffs into the lungs every 6 (six) hours as needed for wheezing or shortness of breath., Disp: 6.7 g, Rfl: 0 ?  amitriptyline (ELAVIL) 25 MG tablet, Take 25 mg by mouth at bedtime., Disp: , Rfl:  ?  bisacodyl (DULCOLAX) 5 MG EC tablet, Take 2 tablets (10 mg total) by mouth every other day. Take at bedtime, Disp: 30 tablet, Rfl: 0 ?  carvedilol (COREG) 3.125 MG tablet, Take 1 tablet (3.125 mg total) by mouth 2 (two) times daily with a meal., Disp: 60 tablet, Rfl: 10 ?  donepezil (ARICEPT) 10 MG tablet, Take 1 tablet (10 mg total) by mouth at bedtime., Disp: 30 tablet, Rfl: 11 ?  ELIQUIS 5 MG TABS tablet, TAKE ONE TABLET BY MOUTH EVERY MORNING and TAKE ONE TABLET BY MOUTH EVERYDAY AT BEDTIME, Disp: 60 tablet, Rfl: 3 ?  ezetimibe (ZETIA) 10 MG tablet, TAKE ONE TABLET BY MOUTH EVERY MORNING, Disp: 30 tablet, Rfl: 11 ?  FARXIGA 10 MG TABS tablet, TAKE ONE TABLET BY MOUTH EVERY MORNING, Disp: 90 tablet, Rfl: 1 ?  gabapentin (NEURONTIN) 100 MG capsule, TAKE ONE CAPSULE BY MOUTH EVERY MORNING and TAKE ONE  CAPSULE BY MOUTH AT NOON and TAKE ONE CAPSULE BY MOUTH EVERYDAY AT BEDTIME, Disp: 270 capsule, Rfl: 0 ?  glucose blood (ONETOUCH VERIO) test strip, 1 each by Other route 2 (two) times daily. And lancets 2/day, Disp: 200 each, Rfl: 3 ?  insulin degludec (TRESIBA FLEXTOUCH) 200 UNIT/ML FlexTouch Pen, Inject 54 Units into the skin daily. And pen needles 1/day, Disp: 18 mL, Rfl: 3 ?  Insulin Syringe-Needle U-100 (INSULIN SYRINGE .3CC/29GX1/2") 29G X 1/2" 0.3 ML MISC, Inject 1 Syringe 3 (three) times daily as  directed. Check blood sugar three times daily. Dx: E11.9, Disp: 100 each, Rfl: 3 ?  Lancets (ONETOUCH DELICA PLUS [id number]) MISC, USE TWICE DAILY, Disp: 200 each, Rfl: 3 ?  losartan (COZAAR) 25 MG tablet, Take 1 tablet (25 mg total) by mouth daily., Disp: 30 tablet, Rfl: 3 ?  pantoprazole (PROTONIX) 40 MG tablet, TAKE ONE TABLET BY MOUTH ONCE DAILY, Disp: 90 tablet, Rfl: 3 ?  polyethylene glycol (MIRALAX / GLYCOLAX) 17 g packet, Take 17 g by mouth 2 (two) times daily., Disp: 14 each, Rfl: 0 ?  REPATHA 140 MG/ML SOSY, INJECT 1 PEN INTO THE SKIN EVERY 14 DAYS, Disp: 6 mL, Rfl: 3 ?  rosuvastatin (CRESTOR) 40 MG tablet, Take 40 mg by mouth at bedtime., Disp: , Rfl:  ?  spironolactone (ALDACTONE) 25 MG tablet, Take 1 tablet (25 mg total) by mouth daily., Disp: 90 tablet, Rfl: 3 ?  nitroGLYCERIN (NITROSTAT) 0.4 MG SL tablet, Place 1 tablet (0.4 mg total) under the tongue every 5 (five) minutes as needed for chest pain. (Patient not taking: Reported on 10/28/2021), Disp: 30 tablet, Rfl: 1 ?  ondansetron (ZOFRAN-ODT) 4 MG disintegrating tablet, Take 1 tablet (4 mg total) by mouth every 8 (eight) hours as needed for nausea or vomiting. (Patient not taking: Reported on 10/28/2021), Disp: 8 tablet, Rfl: 0 ?  sildenafil (VIAGRA) 100 MG tablet, Take 1 tablet (100 mg total) by mouth daily as needed for erectile dysfunction., Disp: 10 tablet, Rfl: 11 ?No Known Allergies ? ? ? ?Social History  ? ?Socioeconomic History  ? Marital status: Married  ?  Spouse name: Not on file  ? Number of children: 0  ? Years of education: 47  ? Highest education level: Some college, no degree  ?Occupational History  ? Occupation: Disability  ?Tobacco Use  ? Smoking status: Never  ? Smokeless tobacco: Never  ?Vaping Use  ? Vaping Use: Never used  ?Substance and Sexual Activity  ? Alcohol use: No  ? Drug use: No  ? Sexual activity: Yes  ?  Birth control/protection: None  ?Other Topics Concern  ? Not on file  ?Social History Narrative  ? Social History   ? Diet?   ? Do you drink/eat things with caffeine? yes  ? Marital status?       single     ? Do you live in a house, apartment, assisted living, condo, trailer, etc.? yes  ? Is it one or more stories? One story  ? How many persons live in your home?  ? Do you have any pets in your home? (please list)   ? Highest level of education completed? graduate  ? Do you exercise?            no                          Type & how often?  ? Advanced Directives  ? Do  you have a living will? no  ? Do you have a DNR form?                                  If not, do you want to discuss one? no  ? Do you have signed POA/HPOA for forms? no  ?   ? Functional Status  ? Do you have difficulty bathing or dressing yourself? no  ? Do you have difficulty preparing food or eating? no  ? Do you have difficulty managing your medications? no  ? Do you have difficulty managing your finances? no  ? Do you have difficulty affording your medications? no  ? ?Social Determinants of Health  ? ?Financial Resource Strain: Not on file  ?Food Insecurity: No Food Insecurity  ? Worried About Charity fundraiser in the Last Year: Never true  ? Ran Out of Food in the Last Year: Never true  ?Transportation Needs: No Transportation Needs  ? Lack of Transportation (Medical): No  ? Lack of Transportation (Non-Medical): No  ?Physical Activity: Not on file  ?Stress: Not on file  ?Social Connections: Not on file  ?Intimate Partner Violence: Not on file  ? ? ?Physical Exam ? ? ? ? ? ?Future Appointments  ?Date Time Provider [place]  ?11/15/2021  2:15 PM [name], MD [id number] None  ?11/22/2021  9:00 AM [name], MD [facility] None  ?11/30/2021  2:00 PM Pleasant, Eppie Gibson, RN [facility] None  ?03/23/2022  9:00 AM Shawn Route, Coralee Pesa, [name] [facility] None  ? ? ? ? ? ?Marylouise Stacks, [place] ?[phone] ?[facility] Paramedic  ?10/28/21 ?

## 2021-11-04 NOTE — Progress Notes (Signed)
[employer] PREP Weekly Session ? ?Patient Details  ?Name: Calvin [name] ?MRN: [id number] ?Date of Birth: 08/06/1960 ?Age: 61 y.o. ?PCP: Roselee Nova, MD ? ?There were no vitals filed for this visit. ? ? [facility] Weekly seesion - 11/04/21 1600   ? ?  ? [facility] "PREP" Location  ? [facility] "PREP[facility] Location [place]   ?  ? Weekly Session  ? Topic Discussed Importance of resistance training;Other ways to be active   ? Classes attended to date 1   ? ?  ?  ? ?  ? ?Fatigues with exercise. Was unable to complete fit testing without fatigue.  ?Admitted to not eating before class. Given granola bar. Encouraged to eat prior to class  ?Barnett Hatter ?11/04/2021, 4:31 PM ? ? ?

## 2021-11-07 ENCOUNTER — Emergency Department (HOSPITAL_COMMUNITY)
Admission: EM | Admit: 2021-11-07 | Discharge: 2021-11-07 | Disposition: A | Discharge status: Home / Self Care | Payer: Medicare Other | Attending: Emergency Medicine | Admitting: Emergency Medicine

## 2021-11-07 ENCOUNTER — Other Ambulatory Visit: Payer: Self-pay

## 2021-11-07 ENCOUNTER — Emergency Department (HOSPITAL_COMMUNITY): Payer: Medicare Other

## 2021-11-07 ENCOUNTER — Ambulatory Visit (INDEPENDENT_AMBULATORY_CARE_PROVIDER_SITE_OTHER): Payer: Medicare Other

## 2021-11-07 ENCOUNTER — Ambulatory Visit (HOSPITAL_COMMUNITY)
Admission: EM | Admit: 2021-11-07 | Discharge: 2021-11-07 | Disposition: A | Discharge status: Home / Self Care | Payer: Medicare Other | Attending: Nurse Practitioner | Admitting: Nurse Practitioner

## 2021-11-07 ENCOUNTER — Encounter (HOSPITAL_COMMUNITY): Payer: Self-pay

## 2021-11-07 DIAGNOSIS — R1084 Generalized abdominal pain: Secondary | ICD-10-CM

## 2021-11-07 DIAGNOSIS — E1140 Type 2 diabetes mellitus with diabetic neuropathy, unspecified: Secondary | ICD-10-CM | POA: Diagnosis not present

## 2021-11-07 DIAGNOSIS — Z7901 Long term (current) use of anticoagulants: Secondary | ICD-10-CM | POA: Insufficient documentation

## 2021-11-07 DIAGNOSIS — N400 Enlarged prostate without lower urinary tract symptoms: Secondary | ICD-10-CM | POA: Insufficient documentation

## 2021-11-07 DIAGNOSIS — Z79899 Other long term (current) drug therapy: Secondary | ICD-10-CM | POA: Insufficient documentation

## 2021-11-07 DIAGNOSIS — Z794 Long term (current) use of insulin: Secondary | ICD-10-CM | POA: Diagnosis not present

## 2021-11-07 DIAGNOSIS — R1110 Vomiting, unspecified: Secondary | ICD-10-CM | POA: Diagnosis not present

## 2021-11-07 DIAGNOSIS — E1122 Type 2 diabetes mellitus with diabetic chronic kidney disease: Secondary | ICD-10-CM | POA: Diagnosis not present

## 2021-11-07 DIAGNOSIS — R1030 Lower abdominal pain, unspecified: Secondary | ICD-10-CM | POA: Diagnosis not present

## 2021-11-07 DIAGNOSIS — K760 Fatty (change of) liver, not elsewhere classified: Secondary | ICD-10-CM | POA: Diagnosis not present

## 2021-11-07 DIAGNOSIS — N189 Chronic kidney disease, unspecified: Secondary | ICD-10-CM | POA: Diagnosis not present

## 2021-11-07 DIAGNOSIS — K5900 Constipation, unspecified: Secondary | ICD-10-CM | POA: Diagnosis not present

## 2021-11-07 DIAGNOSIS — R109 Unspecified abdominal pain: Secondary | ICD-10-CM

## 2021-11-07 LAB — COMPREHENSIVE METABOLIC PANEL
ALT: 28 U/L (ref 0–44)
AST: 25 U/L (ref 15–41)
Albumin: 4 g/dL (ref 3.5–5.0)
Alkaline Phosphatase: 80 U/L (ref 38–126)
Anion gap: 8 (ref 5–15)
BUN: 17 mg/dL (ref 8–23)
CO2: 25 mmol/L (ref 22–32)
Calcium: 9.5 mg/dL (ref 8.9–10.3)
Chloride: 105 mmol/L (ref 98–111)
Creatinine, Ser: 1.74 mg/dL — ABNORMAL HIGH (ref 0.61–1.24)
GFR, Estimated: 44 mL/min — ABNORMAL LOW (ref >60)
Glucose, Bld: 176 mg/dL — ABNORMAL HIGH (ref 70–99)
Potassium: 4.1 mmol/L (ref 3.5–5.1)
Sodium: 138 mmol/L (ref 135–145)
Total Bilirubin: 1.1 mg/dL (ref 0.3–1.2)
Total Protein: 7.5 g/dL (ref 6.5–8.1)

## 2021-11-07 LAB — CBC
HCT: 47.8 % (ref 39.0–52.0)
Hemoglobin: 15.9 g/dL (ref 13.0–17.0)
MCH: 28.7 pg (ref 26.0–34.0)
MCHC: 33.3 g/dL (ref 30.0–36.0)
MCV: 86.3 fL (ref 80.0–100.0)
Platelets: 233 10*3/uL (ref 150–400)
RBC: 5.54 MIL/uL (ref 4.22–5.81)
RDW: 13.2 % (ref 11.5–15.5)
WBC: 7.9 10*3/uL (ref 4.0–10.5)
nRBC: 0 % (ref 0.0–0.2)

## 2021-11-07 LAB — LIPASE, BLOOD: Lipase: 32 U/L (ref 11–51)

## 2021-11-07 MED ORDER — IOHEXOL 300 MG/ML  SOLN
100.0000 mL | Freq: Once | INTRAMUSCULAR | Status: AC | PRN
  Administered 2021-11-07: 80 mL via INTRAVENOUS

## 2021-11-07 NOTE — ED Triage Notes (Signed)
2 day h/o abdominal pain and HA. Confirms emesis. Denies diarrhea. No meds taken.  ?

## 2021-11-07 NOTE — ED Triage Notes (Signed)
Pt reports he is here today due to abd pain. Pt reports the abd pain started 6 months ago. Pt reports emesis. Pt reports lower abd pain. Pt reports he was sent here today from UC for further work up Pt reports this has been an ongoing issue. Pt denies any abd surgeries.  ?

## 2021-11-07 NOTE — ED Notes (Signed)
Patient notified that a urine sample is needed patient reports that he will try when possible ?

## 2021-11-07 NOTE — ED Notes (Signed)
Patient is being discharged from the Urgent Care and sent to the Emergency Department via pov . Per Noemi Chapel, NP, patient is in need of higher level of care due to limited resources at ucc. Patient is aware and verbalizes understanding of plan of care.  ?Vitals:  ? 11/07/21 1751  ?BP: (!) 147/85  ?Pulse: 99  ?Resp: 18  ?Temp: 97.7 ?F (36.5 ?C)  ?SpO2: 100%  ?  ?

## 2021-11-07 NOTE — Discharge Instructions (Addendum)
Your CT did not show any thing acute. Your lab did not show anything acute.  Given you phone number to a GI specialist.  Please call them if you have further concerns.  Follow-up with your primary care provider.  ?

## 2021-11-07 NOTE — ED Provider Notes (Signed)
?[name] ? ? ? ?CSN: [id number] ?Arrival date & time: 11/07/21  1718 ? ? ?  ? ?History   ?Chief Complaint ?Chief Complaint  ?Patient presents with  ? Abdominal Pain  ? ? ?HPI ?Calvin [name] is a 61 y.o. male.  ? ?Patient presents with lower abdominal pain has been ongoing for the past 3 days.  He rates the pain as a 10/10.  Describes the pain as sharp and shooting.  The pain is constant.  The pain radiates from the left side of his abdomen to the right.   He denies fevers, body aches, unexplained weight loss, diarrhea.  He also denies any heartburn, new rash on his skin, dysuria/urinary frequency, hematuria.  Patient endorses some chills, nausea with bilious vomiting, decreased appetite, and constipation.  He has also had some dizziness and headache since his symptoms started.  He has tried doing a "cleanout" to help with the constipation and this has not helped.  He tried taking Ex-Lax and magnesium citrate and only had a small bowel movement without blood yesterday.  No known alleviating or aggravating factors. ? ? ?Past Medical History:  ?Diagnosis Date  ? Adenoidal hypertrophy   ? Adrenal nodule 09/21/2020  ? AF (paroxysmal atrial fibrillation) 03/10/2018  ? AKI (acute kidney injury) 06/17/2019  ? Arthritis   ? Cataract   ? Mixed OU  ? Chronic anticoagulation 10/15/2019  ? Chronic systolic heart failure 45/36/4680  ? Coronary artery disease   ? a. cath in 08/2017 showing mild nonobstructive CAD with scattered 20-30% stenosis.   ? Diabetic neuropathy 09/26/2017  ? Diabetic retinopathy   ? NPDR OU  ? Essential hypertension 11/22/2015  ? Foot pain, bilateral 02/01/2019  ? Gastroesophageal reflux disease 05/10/2017  ? Heart attack   ? While living in [place]  ? History of adenomatous colonic polyps 05/19/2011  ? 05/2011 9 adenomas 08/2017 5 adenomas (dimin) Recall 2022  ? History of COVID-19 06/18/2019  ? HLD (hyperlipidemia) 11/20/2015  ? Hypoglycemia due to insulin 10/15/2019  ? Insomnia 05/10/2017  ?  Intermittent claudication 05/25/2020  ? Mild neurocognitive disorder due to Alzheimer's disease, possible 08/24/2021  ? Nonischemic cardiomyopathy   ? a. EF 20-25% by echo in 09/2017 with cath showing mild CAD. b.  Last echo 12/2017 EF 35-40%, grade 2 DD.  ? Obesity   ? Onychomycosis of multiple toenails with type 2 diabetes mellitus and peripheral neuropathy 05/10/2017  ? OSA on CPAP 05/10/2017  ? Right sided weakness 06/17/2019  ? Type II diabetes mellitus 11/20/2015  ? Unsteady gait 12/20/2018  ? ? ?Patient Active Problem List  ? Diagnosis Date Noted  ? ED (erectile dysfunction) 09/15/2021  ? Mild neurocognitive disorder due to Alzheimer's disease, possible 08/24/2021  ? Diabetic retinopathy 08/24/2021  ? Heart attack 08/24/2021  ? Nonischemic cardiomyopathy 08/24/2021  ? Coronary artery disease 08/24/2021  ? Adrenal nodule 09/21/2020  ? Intermittent claudication 05/25/2020  ? Hypoglycemia due to insulin 10/15/2019  ? Chronic anticoagulation 10/15/2019  ? Right sided weakness 06/17/2019  ? Foot pain, bilateral 02/01/2019  ? Unsteady gait 12/20/2018  ? AF (paroxysmal atrial fibrillation) 03/10/2018  ? Diabetic neuropathy 09/26/2017  ? OSA on CPAP 05/10/2017  ? Gastroesophageal reflux disease 05/10/2017  ? Insomnia 05/10/2017  ? Onychomycosis of multiple toenails with type 2 diabetes mellitus and peripheral neuropathy 05/10/2017  ? Chronic systolic heart failure 32/06/2481  ? Essential hypertension 11/22/2015  ? Type II diabetes mellitus 11/20/2015  ? HLD (hyperlipidemia) 11/20/2015  ? History of adenomatous  colonic polyps 05/19/2011  ? ? ?Past Surgical History:  ?Procedure Laterality Date  ? COLONOSCOPY  05/13/11, 08/2017  ? 9 adenomas  ? FOREARM SURGERY    ? MUSCLE BIOPSY    ? POLYPECTOMY    ? RIGHT/LEFT HEART CATH AND CORONARY ANGIOGRAPHY N/A 08/25/2017  ? Procedure: RIGHT/LEFT HEART CATH AND CORONARY ANGIOGRAPHY;  Surgeon: [name], MD;  Location: [facility];  Service: Cardiovascular;   Laterality: N/A;  ? RIGHT/LEFT HEART CATH AND CORONARY ANGIOGRAPHY N/A 10/01/2019  ? Procedure: RIGHT/LEFT HEART CATH AND CORONARY ANGIOGRAPHY;  Surgeon: [name], MD;  Location: [facility];  Service: Cardiovascular;  Laterality: N/A;  ? RIGHT/LEFT HEART CATH AND CORONARY ANGIOGRAPHY N/A 04/07/2021  ? Procedure: RIGHT/LEFT HEART CATH AND CORONARY ANGIOGRAPHY;  Surgeon: [name], MD;  Location: [facility];  Service: Cardiovascular;  Laterality: N/A;  ? ? ? ? ? ?Home Medications   ? ?Prior to Admission medications   ?Medication Sig Start Date End Date Taking? Authorizing Provider  ?albuterol (VENTOLIN HFA) 108 (90 Base) MCG/ACT inhaler Inhale 2 puffs into the lungs every 6 (six) hours as needed for wheezing or shortness of breath. 06/30/19   Thurnell Lose, MD  ?amitriptyline (ELAVIL) 25 MG tablet Take 25 mg by mouth at bedtime. 08/16/21   [provider]  ?bisacodyl (DULCOLAX) 5 MG EC tablet Take 2 tablets (10 mg total) by mouth every other day. Take at bedtime 12/04/20   [name], MD  ?carvedilol (COREG) 3.125 MG tablet Take 1 tablet (3.125 mg total) by mouth 2 (two) times daily with a meal. 07/12/21   [name], [name], MD  ?donepezil (ARICEPT) 10 MG tablet Take 1 tablet (10 mg total) by mouth at bedtime. 03/31/21   Wertman, Coralee Pesa, [name]  ?ELIQUIS 5 MG TABS tablet TAKE ONE TABLET BY MOUTH EVERY MORNING and TAKE ONE TABLET BY MOUTH EVERYDAY AT BEDTIME 10/07/21   [name], [name], MD  ?ezetimibe (ZETIA) 10 MG tablet TAKE ONE TABLET BY MOUTH EVERY MORNING 05/13/21   [name], [name], MD  ?FARXIGA 10 MG TABS tablet TAKE ONE TABLET BY MOUTH EVERY MORNING 06/14/21   [name], [name], MD  ?gabapentin (NEURONTIN) 100 MG capsule TAKE ONE CAPSULE BY MOUTH EVERY MORNING and TAKE ONE CAPSULE BY MOUTH AT NOON and TAKE ONE CAPSULE BY MOUTH EVERYDAY AT BEDTIME 08/16/21   [name], NP  ?glucose blood (ONETOUCH VERIO) test strip 1 each by Other route 2 (two) times  daily. And lancets 2/day 09/15/21   [name], MD  ?insulin degludec (TRESIBA FLEXTOUCH) 200 UNIT/ML FlexTouch Pen Inject 54 Units into the skin daily. And pen needles 1/day 09/15/21   [name], MD  ?Insulin Syringe-Needle U-100 (INSULIN SYRINGE .3CC/29GX1/2") 29G X 1/2" 0.3 ML MISC Inject 1 Syringe 3 (three) times daily as directed. Check blood sugar three times daily. Dx: E11.9 05/10/17   [name], DO  ?Lancets (ONETOUCH DELICA PLUS [id number]) Toco USE TWICE DAILY 10/30/19   [name], MD  ?losartan (COZAAR) 25 MG tablet Take 1 tablet (25 mg total) by mouth daily. 09/30/21 12/29/21  [name], [name], MD  ?nitroGLYCERIN (NITROSTAT) 0.4 MG SL tablet Place 1 tablet (0.4 mg total) under the tongue every 5 (five) minutes as needed for chest pain. ?Patient not taking: Reported on 10/28/2021 10/27/17   [name], DO  ?ondansetron (ZOFRAN-ODT) 4 MG disintegrating tablet Take 1 tablet (4 mg total) by mouth every 8 (eight) hours as needed  for nausea or vomiting. ?Patient not taking: Reported on 10/28/2021 09/05/21   Davonna Belling, MD  ?pantoprazole (PROTONIX) 40 MG tablet TAKE ONE TABLET BY MOUTH ONCE DAILY 07/12/21   [name], NP  ?polyethylene glycol (MIRALAX / GLYCOLAX) 17 g packet Take 17 g by mouth 2 (two) times daily. 10/06/20   [name], MD  ?REPATHA 140 MG/ML SOSY INJECT 1 PEN INTO THE SKIN EVERY 14 DAYS 09/08/20   [name], [name], MD  ?rosuvastatin (CRESTOR) 40 MG tablet Take 40 mg by mouth at bedtime. 08/16/21   [provider]  ?sildenafil (VIAGRA) 100 MG tablet Take 1 tablet (100 mg total) by mouth daily as needed for erectile dysfunction. 09/15/21   [name], MD  ?spironolactone (ALDACTONE) 25 MG tablet Take 1 tablet (25 mg total) by mouth daily. 10/15/20   Consuelo Pandy, [name]  ? ? ?Family History ?Family History  ?Problem Relation Age of Onset  ? Heart disease Mother   ? Heart attack Mother 6  ? Hypertension Mother   ? Hyperlipidemia Mother   ? Diabetes  Mother   ? Heart disease Father   ? Heart attack Father 62  ? Hypertension Father   ? Hyperlipidemia Father   ? Diabetes Father   ? Heart attack Sister 15  ? Colon cancer Neg Hx   ? Stomach cancer Neg Hx   ? Es
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
## 2021-11-07 NOTE — Discharge Instructions (Signed)
-   Please go to emergency room for further evaluation of your abdominal pain ?

## 2021-11-15 ENCOUNTER — Ambulatory Visit: Payer: Medicare Other | Admitting: Endocrinology

## 2021-11-15 NOTE — Progress Notes (Signed)
Has only attended 1 or 7 classes of PREP since start 10/25/21 ?Text pt today, he is going to miss today ?Explained via text, he has missed too much and would prefer he be added to a future class instead. ?Pt responded ok.  ?

## 2021-11-17 ENCOUNTER — Ambulatory Visit: Payer: Medicare Other | Admitting: Internal Medicine

## 2021-11-19 ENCOUNTER — Other Ambulatory Visit: Payer: Self-pay

## 2021-11-19 ENCOUNTER — Encounter (HOSPITAL_COMMUNITY): Payer: Self-pay

## 2021-11-19 ENCOUNTER — Inpatient Hospital Stay (HOSPITAL_COMMUNITY)
Admission: EM | Admit: 2021-11-19 | Discharge: 2021-11-23 | DRG: 386 | Disposition: A | Discharge status: Home / Self Care | Payer: Medicare Other | Source: Ambulatory Visit | Attending: Internal Medicine | Admitting: Internal Medicine

## 2021-11-19 ENCOUNTER — Ambulatory Visit: Payer: Medicare Other | Admitting: Internal Medicine

## 2021-11-19 ENCOUNTER — Encounter: Payer: Self-pay | Admitting: Internal Medicine

## 2021-11-19 VITALS — BP 140/80 | HR 88 | Ht 65.5 in | Wt 186.1 lb

## 2021-11-19 DIAGNOSIS — I480 Paroxysmal atrial fibrillation: Secondary | ICD-10-CM | POA: Diagnosis not present

## 2021-11-19 DIAGNOSIS — K559 Vascular disorder of intestine, unspecified: Secondary | ICD-10-CM

## 2021-11-19 DIAGNOSIS — R1115 Cyclical vomiting syndrome unrelated to migraine: Secondary | ICD-10-CM

## 2021-11-19 DIAGNOSIS — I252 Old myocardial infarction: Secondary | ICD-10-CM

## 2021-11-19 DIAGNOSIS — K5150 Left sided colitis without complications: Principal | ICD-10-CM

## 2021-11-19 DIAGNOSIS — K626 Ulcer of anus and rectum: Secondary | ICD-10-CM | POA: Diagnosis present

## 2021-11-19 DIAGNOSIS — Z658 Other specified problems related to psychosocial circumstances: Secondary | ICD-10-CM

## 2021-11-19 DIAGNOSIS — E1159 Type 2 diabetes mellitus with other circulatory complications: Secondary | ICD-10-CM

## 2021-11-19 DIAGNOSIS — D122 Benign neoplasm of ascending colon: Secondary | ICD-10-CM

## 2021-11-19 DIAGNOSIS — N400 Enlarged prostate without lower urinary tract symptoms: Secondary | ICD-10-CM | POA: Diagnosis present

## 2021-11-19 DIAGNOSIS — G309 Alzheimer's disease, unspecified: Secondary | ICD-10-CM | POA: Diagnosis present

## 2021-11-19 DIAGNOSIS — K5900 Constipation, unspecified: Secondary | ICD-10-CM | POA: Diagnosis present

## 2021-11-19 DIAGNOSIS — E1140 Type 2 diabetes mellitus with diabetic neuropathy, unspecified: Secondary | ICD-10-CM | POA: Diagnosis present

## 2021-11-19 DIAGNOSIS — I2510 Atherosclerotic heart disease of native coronary artery without angina pectoris: Secondary | ICD-10-CM | POA: Diagnosis present

## 2021-11-19 DIAGNOSIS — E119 Type 2 diabetes mellitus without complications: Secondary | ICD-10-CM

## 2021-11-19 DIAGNOSIS — Z8249 Family history of ischemic heart disease and other diseases of the circulatory system: Secondary | ICD-10-CM

## 2021-11-19 DIAGNOSIS — R1084 Generalized abdominal pain: Secondary | ICD-10-CM | POA: Diagnosis not present

## 2021-11-19 DIAGNOSIS — Z794 Long term (current) use of insulin: Secondary | ICD-10-CM

## 2021-11-19 DIAGNOSIS — Z8616 Personal history of COVID-19: Secondary | ICD-10-CM

## 2021-11-19 DIAGNOSIS — K625 Hemorrhage of anus and rectum: Secondary | ICD-10-CM | POA: Diagnosis not present

## 2021-11-19 DIAGNOSIS — Z833 Family history of diabetes mellitus: Secondary | ICD-10-CM

## 2021-11-19 DIAGNOSIS — E11311 Type 2 diabetes mellitus with unspecified diabetic retinopathy with macular edema: Secondary | ICD-10-CM | POA: Diagnosis present

## 2021-11-19 DIAGNOSIS — K219 Gastro-esophageal reflux disease without esophagitis: Secondary | ICD-10-CM | POA: Diagnosis present

## 2021-11-19 DIAGNOSIS — E785 Hyperlipidemia, unspecified: Secondary | ICD-10-CM | POA: Diagnosis present

## 2021-11-19 DIAGNOSIS — Z860100 Personal history of colon polyps, unspecified: Secondary | ICD-10-CM

## 2021-11-19 DIAGNOSIS — I428 Other cardiomyopathies: Secondary | ICD-10-CM | POA: Diagnosis not present

## 2021-11-19 DIAGNOSIS — Z91148 Patient's other noncompliance with medication regimen for other reason: Secondary | ICD-10-CM | POA: Diagnosis not present

## 2021-11-19 DIAGNOSIS — R112 Nausea with vomiting, unspecified: Secondary | ICD-10-CM

## 2021-11-19 DIAGNOSIS — K5909 Other constipation: Secondary | ICD-10-CM | POA: Diagnosis present

## 2021-11-19 DIAGNOSIS — G8929 Other chronic pain: Secondary | ICD-10-CM | POA: Diagnosis present

## 2021-11-19 DIAGNOSIS — E1165 Type 2 diabetes mellitus with hyperglycemia: Secondary | ICD-10-CM | POA: Diagnosis present

## 2021-11-19 DIAGNOSIS — I130 Hypertensive heart and chronic kidney disease with heart failure and stage 1 through stage 4 chronic kidney disease, or unspecified chronic kidney disease: Secondary | ICD-10-CM | POA: Diagnosis present

## 2021-11-19 DIAGNOSIS — N1831 Chronic kidney disease, stage 3a: Secondary | ICD-10-CM

## 2021-11-19 DIAGNOSIS — Z7901 Long term (current) use of anticoagulants: Secondary | ICD-10-CM

## 2021-11-19 DIAGNOSIS — K633 Ulcer of intestine: Secondary | ICD-10-CM

## 2021-11-19 DIAGNOSIS — D123 Benign neoplasm of transverse colon: Secondary | ICD-10-CM

## 2021-11-19 DIAGNOSIS — E1122 Type 2 diabetes mellitus with diabetic chronic kidney disease: Secondary | ICD-10-CM | POA: Diagnosis present

## 2021-11-19 DIAGNOSIS — K449 Diaphragmatic hernia without obstruction or gangrene: Secondary | ICD-10-CM | POA: Diagnosis present

## 2021-11-19 DIAGNOSIS — I5042 Chronic combined systolic (congestive) and diastolic (congestive) heart failure: Secondary | ICD-10-CM | POA: Diagnosis present

## 2021-11-19 DIAGNOSIS — R739 Hyperglycemia, unspecified: Secondary | ICD-10-CM

## 2021-11-19 DIAGNOSIS — F067 Alzheimer's disease, unspecified: Secondary | ICD-10-CM | POA: Diagnosis present

## 2021-11-19 DIAGNOSIS — E1151 Type 2 diabetes mellitus with diabetic peripheral angiopathy without gangrene: Secondary | ICD-10-CM | POA: Diagnosis present

## 2021-11-19 DIAGNOSIS — N289 Disorder of kidney and ureter, unspecified: Secondary | ICD-10-CM

## 2021-11-19 DIAGNOSIS — Z79899 Other long term (current) drug therapy: Secondary | ICD-10-CM

## 2021-11-19 HISTORY — DX: Chronic kidney disease, stage 3a: N18.31

## 2021-11-19 LAB — CBC WITH DIFFERENTIAL/PLATELET
Abs Immature Granulocytes: 0.01 10*3/uL (ref 0.00–0.07)
Basophils Absolute: 0 10*3/uL (ref 0.0–0.1)
Basophils Relative: 1 %
Eosinophils Absolute: 0.4 10*3/uL (ref 0.0–0.5)
Eosinophils Relative: 6 %
HCT: 47.6 % (ref 39.0–52.0)
Hemoglobin: 16 g/dL (ref 13.0–17.0)
Immature Granulocytes: 0 %
Lymphocytes Relative: 32 %
Lymphs Abs: 1.9 10*3/uL (ref 0.7–4.0)
MCH: 29.3 pg (ref 26.0–34.0)
MCHC: 33.6 g/dL (ref 30.0–36.0)
MCV: 87 fL (ref 80.0–100.0)
Monocytes Absolute: 0.4 10*3/uL (ref 0.1–1.0)
Monocytes Relative: 6 %
Neutro Abs: 3.2 10*3/uL (ref 1.7–7.7)
Neutrophils Relative %: 55 %
Platelets: 209 10*3/uL (ref 150–400)
RBC: 5.47 MIL/uL (ref 4.22–5.81)
RDW: 13.6 % (ref 11.5–15.5)
WBC: 5.9 10*3/uL (ref 4.0–10.5)
nRBC: 0 % (ref 0.0–0.2)

## 2021-11-19 LAB — LIPASE, BLOOD: Lipase: 36 U/L (ref 11–51)

## 2021-11-19 LAB — COMPREHENSIVE METABOLIC PANEL
ALT: 17 U/L (ref 0–44)
AST: 16 U/L (ref 15–41)
Albumin: 4 g/dL (ref 3.5–5.0)
Alkaline Phosphatase: 71 U/L (ref 38–126)
Anion gap: 8 (ref 5–15)
BUN: 15 mg/dL (ref 8–23)
CO2: 24 mmol/L (ref 22–32)
Calcium: 9.5 mg/dL (ref 8.9–10.3)
Chloride: 103 mmol/L (ref 98–111)
Creatinine, Ser: 1.48 mg/dL — ABNORMAL HIGH (ref 0.61–1.24)
GFR, Estimated: 53 mL/min — ABNORMAL LOW (ref >60)
Glucose, Bld: 303 mg/dL — ABNORMAL HIGH (ref 70–99)
Potassium: 4.5 mmol/L (ref 3.5–5.1)
Sodium: 135 mmol/L (ref 135–145)
Total Bilirubin: 0.8 mg/dL (ref 0.3–1.2)
Total Protein: 7.5 g/dL (ref 6.5–8.1)

## 2021-11-19 MED ORDER — SORBITOL 70 % SOLN
960.0000 mL | TOPICAL_OIL | Freq: Once | ORAL | Status: AC
  Administered 2021-11-19: 960 mL via RECTAL
  Filled 2021-11-19: qty 473

## 2021-11-19 MED ORDER — SODIUM CHLORIDE 0.9 % IV SOLN: INTRAVENOUS | Status: DC

## 2021-11-19 MED ORDER — FENTANYL CITRATE PF 50 MCG/ML IJ SOSY
100.0000 ug | PREFILLED_SYRINGE | INTRAMUSCULAR | Status: DC | PRN
  Administered 2021-11-19 – 2021-11-20 (×2): 100 ug via INTRAVENOUS
  Filled 2021-11-19 (×2): qty 2

## 2021-11-19 MED ORDER — SODIUM CHLORIDE 0.9 % IV BOLUS
500.0000 mL | Freq: Once | INTRAVENOUS | Status: AC
  Administered 2021-11-19: 500 mL via INTRAVENOUS
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
## 2021-11-19 NOTE — ED Provider Triage Note (Signed)
Emergency Medicine Provider Triage Evaluation Note  Calvin [name] , a 61 y.o. male  was evaluated in triage.  Pt complains of constipation.  He states that he has not had a bowel movement in 3 weeks.  Presents from his GI doctor who sent him here to be admitted for observation and smog enema administration.  He states that his pain is unchanged from when he was here on 5/7 for abdominal pain.  He states that he has been unable to eat because when he does eat he immediately vomits.  Therefore, he has also been unable to take his medications or p.o. stool softeners.  He also endorses feeling somewhat weak and lightheaded and suspects that he is dehydrated.  Review of Systems  Positive: Constipation, abdominal pain Negative: Fevers, chills  Physical Exam  BP (!) 160/87 (BP Location: Left Arm)   Pulse 100   Temp 98.1 F (36.7 C) (Oral)   Resp 18   Ht 5' 5.5" (1.664 m)   Wt 84.4 kg   SpO2 100%   BMI 30.50 kg/m  Gen:   Awake, no distress   Resp:  Normal effort  MSK:   Moves extremities without difficulty  Other:  Mild generalized abdominal tenderness mostly on the right  Medical Decision Making  Medically screening exam initiated at 12:50 PM.  Appropriate orders placed.  Calvin [name] was informed that the remainder of the evaluation will be completed by another provider, this initial triage assessment does not replace that evaluation, and the importance of remaining in the ED until their evaluation is complete.     Calvin Face, [name] 11/19/21 1253

## 2021-11-19 NOTE — ED Triage Notes (Signed)
Patient reports that he just left [facility] today.  Patient was sent to the ED for Observation Care. Patient states he has not had a BM in 2 weeks. Patient c/o  mid right abdominal pain, N/v.

## 2021-11-19 NOTE — Patient Instructions (Signed)
I think you need to be evaluated in the emergency department and most likely admitted for observation care.  You are failing outpatient treatment with persistent severe constipation and nausea vomiting and inability to take your medications.  Go to the [facility] Emergency department and show them this note.  I will be available to help during your hospitalization.  [name], MD, [facility]

## 2021-11-19 NOTE — ED Provider Notes (Signed)
[name] DEPT Provider Note   CSN: [id number] Arrival date & time: 11/19/21  1152     History  Chief Complaint  Patient presents with   Constipation   Emesis   Abdominal Pain    Calvin [name] is a 61 y.o. male.  HPI Patient presents for evaluation of not having a bowel movement for about 3 weeks.  He states he has been using MiraLAX and Ex-Lax, without relief.  He saw his GI doctor this morning who recommended he come here for management.  He has history of diabetes, neuropathy, congestive heart failure, is anticoagulated, has chronic constipation and history of colon polyps.  GI plans to follow him after he is admitted to the hospital on this visit, and anticipates he may need colonoscopy for clearance of stool from the colon.    Home Medications Prior to Admission medications   Medication Sig Start Date End Date Taking? Authorizing Provider  albuterol (VENTOLIN HFA) 108 (90 Base) MCG/ACT inhaler Inhale 2 puffs into the lungs every 6 (six) hours as needed for wheezing or shortness of breath. Patient not taking: Reported on 11/19/2021 06/30/19   Thurnell Lose, MD  amitriptyline (ELAVIL) 25 MG tablet Take 25 mg by mouth at bedtime. Patient not taking: Reported on 11/19/2021 08/16/21   [provider]  bisacodyl (DULCOLAX) 5 MG EC tablet Take 2 tablets (10 mg total) by mouth every other day. Take at bedtime Patient not taking: Reported on 11/19/2021 12/04/20   [name], MD  Blood Glucose Monitoring Suppl ([place]) w/Device KIT See admin instructions. Patient not taking: Reported on 11/19/2021 10/28/21   [provider]  carvedilol (COREG) 3.125 MG tablet Take 1 tablet (3.125 mg total) by mouth 2 (two) times daily with a meal. Patient not taking: Reported on 11/19/2021 07/12/21   [name], [name], MD  donepezil (ARICEPT) 10 MG tablet Take 1 tablet (10 mg total) by mouth at bedtime. Patient not taking:  Reported on 11/19/2021 03/31/21   Sharene Butters E, [name]  ELIQUIS 5 MG TABS tablet TAKE ONE TABLET BY MOUTH EVERY MORNING and TAKE ONE TABLET BY MOUTH EVERYDAY AT BEDTIME Patient not taking: Reported on 11/19/2021 10/07/21   [name], [name], MD  ENTRESTO 24-26 MG Take 1 tablet by mouth at bedtime. Patient not taking: Reported on 11/19/2021 10/28/21   [provider]  ezetimibe (ZETIA) 10 MG tablet TAKE ONE TABLET BY MOUTH EVERY MORNING Patient not taking: Reported on 11/19/2021 05/13/21   [name], [name], MD  FARXIGA 10 MG TABS tablet TAKE ONE TABLET BY MOUTH EVERY MORNING Patient not taking: Reported on 11/19/2021 06/14/21   [name], [name], MD  gabapentin (NEURONTIN) 100 MG capsule TAKE ONE CAPSULE BY MOUTH EVERY MORNING and TAKE ONE CAPSULE BY MOUTH AT NOON and TAKE ONE CAPSULE BY MOUTH EVERYDAY AT BEDTIME Patient not taking: Reported on 11/19/2021 08/16/21   [name], NP  glucose blood (ONETOUCH VERIO) test strip 1 each by Other route 2 (two) times daily. And lancets 2/day Patient not taking: Reported on 11/19/2021 09/15/21   [name], MD  insulin degludec (TRESIBA FLEXTOUCH) 200 UNIT/ML FlexTouch Pen Inject 54 Units into the skin daily. And pen needles 1/day Patient not taking: Reported on 11/19/2021 09/15/21   [name], MD  Insulin Syringe-Needle U-100 (INSULIN SYRINGE .3CC/29GX1/2") 29G X 1/2" 0.3 ML MISC Inject 1 Syringe 3 (three) times daily as directed. Check blood sugar three times daily. Dx: E11.9 Patient not taking:  Reported on 11/19/2021 05/10/17   [name], DO  Lancets (ONETOUCH DELICA PLUS [id number]) [place] Patient not taking: Reported on 11/19/2021 10/30/19   [name], MD  losartan (COZAAR) 25 MG tablet Take 1 tablet (25 mg total) by mouth daily. Patient not taking: Reported on 11/19/2021 09/30/21 12/29/21  [name], [name], MD  nitroGLYCERIN (NITROSTAT) 0.4 MG SL tablet Place 1 tablet (0.4 mg total) under the tongue every 5 (five)  minutes as needed for chest pain. Patient not taking: Reported on 10/28/2021 10/27/17   [name], DO  ondansetron (ZOFRAN-ODT) 4 MG disintegrating tablet Take 1 tablet (4 mg total) by mouth every 8 (eight) hours as needed for nausea or vomiting. Patient not taking: Reported on 11/19/2021 09/05/21   Davonna Belling, MD  pantoprazole (PROTONIX) 40 MG tablet TAKE ONE TABLET BY MOUTH ONCE DAILY Patient not taking: Reported on 11/19/2021 07/12/21   [name], NP  polyethylene glycol (MIRALAX / GLYCOLAX) 17 g packet Take 17 g by mouth 2 (two) times daily. Patient not taking: Reported on 11/19/2021 10/06/20   [name], MD  REPATHA 140 MG/ML SOSY INJECT 1 PEN INTO THE SKIN EVERY 14 DAYS Patient not taking: Reported on 11/19/2021 09/08/20   [name], [name], MD  rosuvastatin (CRESTOR) 40 MG tablet Take 40 mg by mouth at bedtime. Patient not taking: Reported on 11/19/2021 08/16/21   [provider]  sildenafil (VIAGRA) 100 MG tablet Take 1 tablet (100 mg total) by mouth daily as needed for erectile dysfunction. Patient not taking: Reported on 11/19/2021 09/15/21   [name], MD  spironolactone (ALDACTONE) 25 MG tablet Take 1 tablet (25 mg total) by mouth daily. Patient not taking: Reported on 11/19/2021 10/15/20   Consuelo Pandy, [name]  traMADol Veatrice Bourbon) 50 MG tablet SMARTSIG:1 Tablet(s) By Mouth Every 8-12 Hours PRN Patient not taking: Reported on 11/19/2021 11/08/21   [provider]      Allergies    Patient has no known allergies.    Review of Systems   Review of Systems  Physical Exam Updated Vital Signs BP (!) 148/125   Pulse (!) 111   Temp 98.1 F (36.7 C) (Oral)   Resp 19   Ht 5' 5.5" (1.664 m)   Wt 84.4 kg   SpO2 96%   BMI 30.50 kg/m  Physical Exam Vitals and nursing note reviewed.  Constitutional:      Appearance: He is well-developed. He is not ill-appearing.  HENT:     Head: Normocephalic and atraumatic.     Right Ear: External ear normal.      Left Ear: External ear normal.  Eyes:     Conjunctiva/sclera: Conjunctivae normal.     Pupils: Pupils are equal, round, and reactive to light.  Neck:     Trachea: Phonation normal.  Cardiovascular:     Rate and Rhythm: Normal rate.  Pulmonary:     Effort: Pulmonary effort is normal.  Abdominal:     General: There is distension.     Tenderness: There is abdominal tenderness.  Musculoskeletal:        General: Normal range of motion.     Cervical back: Normal range of motion and neck supple.  Skin:    General: Skin is warm and dry.  Neurological:     Mental Status: He is alert and oriented to person, place, and time.     Cranial Nerves: No cranial nerve deficit.     Sensory: No sensory deficit.  Motor: No abnormal muscle tone.     Coordination: Coordination normal.  Psychiatric:        Mood and Affect: Mood normal.        Behavior: Behavior normal.        Thought Content: Thought content normal.        Judgment: Judgment normal.    ED Results / Procedures / Treatments   Labs (all labs ordered are listed, but only abnormal results are displayed) Labs Reviewed  COMPREHENSIVE METABOLIC PANEL - Abnormal; Notable for the following components:      Result Value   Glucose, Bld 303 (*)    Creatinine, Ser 1.48 (*)    GFR, Estimated 53 (*)    All other components within normal limits  LIPASE, BLOOD  CBC WITH DIFFERENTIAL/PLATELET  URINALYSIS, ROUTINE W REFLEX MICROSCOPIC    EKG None  Radiology No results found.  Procedures Procedures    Medications Ordered in ED Medications  sorbitol, milk of mag, mineral oil, glycerin (SMOG) enema (has no administration in time range)  0.9 %  sodium chloride infusion (has no administration in time range)  fentaNYL (SUBLIMAZE) injection 100 mcg (100 mcg Intravenous Given 11/19/21 2327)  sodium chloride 0.9 % bolus 500 mL (500 mLs Intravenous New Bag/Given 11/19/21 2153)    ED Course/ Medical Decision Making/ A&P Clinical  Course as of 11/19/21 2330  Fri Nov 19, 2021  2318 Patient tolerated most of a liter of smog enema, but had only a small amount of stool output.  He had mild improvement of his pain. [EW]    Clinical Course User Index [EW] Daleen Bo, MD                           Medical Decision Making Patient presenting for evaluation constipation not available to at home treatments, referred here by his GI physician for management.  Plans are screening for metabolic derangement, smog enema, admission to arrange for improvement of bowel regimen and stabilization.  Patient is chronically ill with multiple medical problems.  Problems Addressed: Constipation, unspecified constipation type: complicated acute illness or injury    Details: No improvement with home treatment. Hyperglycemia: chronic illness or injury that poses a threat to life or bodily functions Renal insufficiency: chronic illness or injury  Amount and/or Complexity of Data Reviewed Independent Historian:     Details: He is a cogent historian External Data Reviewed: notes.    Details: Gastroenterology visit today for management of chronic constipation. Labs: ordered.    Details: CBC, metabolic panel, lipase, urinalysis-normal except hyperglycemia and renal insufficiency. Discussion of management or test interpretation with external provider(s): Consultation hospitalist to admit.  Anticipate GI consultation, tomorrow morning.  Dr. Carlean Purl plans to see the patient in the ED.  Risk Prescription drug management. Decision regarding hospitalization. Risk Details: Patient presenting with significant constipation, for 3 weeks, without improvement using home medications.  Seen by GI today and referred here for definitive management.  Patient given smog enema without relief of stool burden from distal colon.  Screening labs indicate hyperglycemia and renal insufficiency.  These values are relatively unchanged from baseline.  Patient does not  require further ED treatment.  Hospitalist consulted to arrange for admission for management, suggesting repeat enemas, and GI consultation in the morning.           Final Clinical Impression(s) / ED Diagnoses Final diagnoses:  Constipation, unspecified constipation type    Rx / DC Orders ED  Discharge Orders     None         Daleen Bo, MD 11/19/21 2352

## 2021-11-20 ENCOUNTER — Other Ambulatory Visit: Payer: Self-pay

## 2021-11-20 ENCOUNTER — Encounter (HOSPITAL_COMMUNITY): Payer: Self-pay | Admitting: Family Medicine

## 2021-11-20 ENCOUNTER — Observation Stay (HOSPITAL_COMMUNITY): Payer: Medicare Other

## 2021-11-20 DIAGNOSIS — R1115 Cyclical vomiting syndrome unrelated to migraine: Secondary | ICD-10-CM | POA: Diagnosis present

## 2021-11-20 DIAGNOSIS — I252 Old myocardial infarction: Secondary | ICD-10-CM | POA: Diagnosis not present

## 2021-11-20 DIAGNOSIS — K625 Hemorrhage of anus and rectum: Secondary | ICD-10-CM | POA: Diagnosis not present

## 2021-11-20 DIAGNOSIS — E785 Hyperlipidemia, unspecified: Secondary | ICD-10-CM | POA: Diagnosis present

## 2021-11-20 DIAGNOSIS — K219 Gastro-esophageal reflux disease without esophagitis: Secondary | ICD-10-CM | POA: Diagnosis present

## 2021-11-20 DIAGNOSIS — G309 Alzheimer's disease, unspecified: Secondary | ICD-10-CM | POA: Diagnosis present

## 2021-11-20 DIAGNOSIS — Z86010 Personal history of colonic polyps: Secondary | ICD-10-CM | POA: Diagnosis not present

## 2021-11-20 DIAGNOSIS — K635 Polyp of colon: Secondary | ICD-10-CM | POA: Diagnosis not present

## 2021-11-20 DIAGNOSIS — K559 Vascular disorder of intestine, unspecified: Secondary | ICD-10-CM | POA: Diagnosis not present

## 2021-11-20 DIAGNOSIS — R1084 Generalized abdominal pain: Secondary | ICD-10-CM

## 2021-11-20 DIAGNOSIS — K5909 Other constipation: Secondary | ICD-10-CM | POA: Diagnosis present

## 2021-11-20 DIAGNOSIS — E11311 Type 2 diabetes mellitus with unspecified diabetic retinopathy with macular edema: Secondary | ICD-10-CM | POA: Diagnosis present

## 2021-11-20 DIAGNOSIS — I428 Other cardiomyopathies: Secondary | ICD-10-CM | POA: Diagnosis present

## 2021-11-20 DIAGNOSIS — K5150 Left sided colitis without complications: Secondary | ICD-10-CM | POA: Diagnosis present

## 2021-11-20 DIAGNOSIS — I130 Hypertensive heart and chronic kidney disease with heart failure and stage 1 through stage 4 chronic kidney disease, or unspecified chronic kidney disease: Secondary | ICD-10-CM | POA: Diagnosis present

## 2021-11-20 DIAGNOSIS — D122 Benign neoplasm of ascending colon: Secondary | ICD-10-CM | POA: Diagnosis not present

## 2021-11-20 DIAGNOSIS — E1165 Type 2 diabetes mellitus with hyperglycemia: Secondary | ICD-10-CM | POA: Diagnosis present

## 2021-11-20 DIAGNOSIS — R630 Anorexia: Secondary | ICD-10-CM | POA: Diagnosis not present

## 2021-11-20 DIAGNOSIS — K5900 Constipation, unspecified: Secondary | ICD-10-CM | POA: Diagnosis not present

## 2021-11-20 DIAGNOSIS — I2510 Atherosclerotic heart disease of native coronary artery without angina pectoris: Secondary | ICD-10-CM | POA: Diagnosis present

## 2021-11-20 DIAGNOSIS — E1151 Type 2 diabetes mellitus with diabetic peripheral angiopathy without gangrene: Secondary | ICD-10-CM | POA: Diagnosis present

## 2021-11-20 DIAGNOSIS — N400 Enlarged prostate without lower urinary tract symptoms: Secondary | ICD-10-CM | POA: Diagnosis present

## 2021-11-20 DIAGNOSIS — R112 Nausea with vomiting, unspecified: Secondary | ICD-10-CM | POA: Diagnosis not present

## 2021-11-20 DIAGNOSIS — Z794 Long term (current) use of insulin: Secondary | ICD-10-CM | POA: Diagnosis not present

## 2021-11-20 DIAGNOSIS — D123 Benign neoplasm of transverse colon: Secondary | ICD-10-CM | POA: Diagnosis not present

## 2021-11-20 DIAGNOSIS — E1140 Type 2 diabetes mellitus with diabetic neuropathy, unspecified: Secondary | ICD-10-CM | POA: Diagnosis present

## 2021-11-20 DIAGNOSIS — E1122 Type 2 diabetes mellitus with diabetic chronic kidney disease: Secondary | ICD-10-CM | POA: Diagnosis present

## 2021-11-20 DIAGNOSIS — K633 Ulcer of intestine: Secondary | ICD-10-CM | POA: Diagnosis present

## 2021-11-20 DIAGNOSIS — Z8616 Personal history of COVID-19: Secondary | ICD-10-CM | POA: Diagnosis not present

## 2021-11-20 DIAGNOSIS — I480 Paroxysmal atrial fibrillation: Secondary | ICD-10-CM | POA: Diagnosis not present

## 2021-11-20 DIAGNOSIS — I5042 Chronic combined systolic (congestive) and diastolic (congestive) heart failure: Secondary | ICD-10-CM | POA: Diagnosis present

## 2021-11-20 DIAGNOSIS — Z833 Family history of diabetes mellitus: Secondary | ICD-10-CM | POA: Diagnosis not present

## 2021-11-20 DIAGNOSIS — Z8249 Family history of ischemic heart disease and other diseases of the circulatory system: Secondary | ICD-10-CM | POA: Diagnosis not present

## 2021-11-20 DIAGNOSIS — K626 Ulcer of anus and rectum: Secondary | ICD-10-CM | POA: Diagnosis present

## 2021-11-20 DIAGNOSIS — N1831 Chronic kidney disease, stage 3a: Secondary | ICD-10-CM | POA: Diagnosis present

## 2021-11-20 DIAGNOSIS — Z79899 Other long term (current) drug therapy: Secondary | ICD-10-CM | POA: Diagnosis not present

## 2021-11-20 LAB — HIV ANTIBODY (ROUTINE TESTING W REFLEX): HIV Screen 4th Generation wRfx: NONREACTIVE

## 2021-11-20 LAB — GLUCOSE, CAPILLARY
Glucose-Capillary: 314 mg/dL — ABNORMAL HIGH (ref 70–99)
Glucose-Capillary: 193 mg/dL — ABNORMAL HIGH (ref 70–99)
Glucose-Capillary: 122 mg/dL — ABNORMAL HIGH (ref 70–99)
Glucose-Capillary: 163 mg/dL — ABNORMAL HIGH (ref 70–99)
Glucose-Capillary: 170 mg/dL — ABNORMAL HIGH (ref 70–99)

## 2021-11-20 LAB — BASIC METABOLIC PANEL
Anion gap: 9 (ref 5–15)
BUN: 15 mg/dL (ref 8–23)
CO2: 25 mmol/L (ref 22–32)
Calcium: 9.4 mg/dL (ref 8.9–10.3)
Chloride: 105 mmol/L (ref 98–111)
Creatinine, Ser: 1.34 mg/dL — ABNORMAL HIGH (ref 0.61–1.24)
GFR, Estimated: >60 mL/min (ref >60)
Glucose, Bld: 319 mg/dL — ABNORMAL HIGH (ref 70–99)
Potassium: 4.6 mmol/L (ref 3.5–5.1)
Sodium: 139 mmol/L (ref 135–145)

## 2021-11-20 LAB — CBC
HCT: 48.4 % (ref 39.0–52.0)
Hemoglobin: 15.9 g/dL (ref 13.0–17.0)
MCH: 28.4 pg (ref 26.0–34.0)
MCHC: 32.9 g/dL (ref 30.0–36.0)
MCV: 86.6 fL (ref 80.0–100.0)
Platelets: 205 10*3/uL (ref 150–400)
RBC: 5.59 MIL/uL (ref 4.22–5.81)
RDW: 13.5 % (ref 11.5–15.5)
WBC: 10.7 10*3/uL — ABNORMAL HIGH (ref 4.0–10.5)
nRBC: 0 % (ref 0.0–0.2)

## 2021-11-20 MED ORDER — ONDANSETRON HCL 4 MG PO TABS
4.0000 mg | ORAL_TABLET | Freq: Four times a day (QID) | ORAL | Status: DC | PRN
  Filled 2021-11-20: qty 1

## 2021-11-20 MED ORDER — SPIRONOLACTONE 25 MG PO TABS
25.0000 mg | ORAL_TABLET | Freq: Every day | ORAL | Status: DC
  Administered 2021-11-20: 25 mg via ORAL
  Filled 2021-11-20 (×2): qty 1

## 2021-11-20 MED ORDER — ACETAMINOPHEN 325 MG PO TABS
650.0000 mg | ORAL_TABLET | Freq: Four times a day (QID) | ORAL | Status: DC | PRN
Start: 2021-11-20 — End: 2021-11-23
  Administered 2021-11-20: 650 mg via ORAL
  Filled 2021-11-20 (×2): qty 2

## 2021-11-20 MED ORDER — TRAZODONE HCL 50 MG PO TABS: 50.0000 mg | ORAL_TABLET | Freq: Every evening | ORAL | Status: DC | PRN

## 2021-11-20 MED ORDER — DAPAGLIFLOZIN PROPANEDIOL 10 MG PO TABS
10.0000 mg | ORAL_TABLET | Freq: Every morning | ORAL | Status: DC
  Administered 2021-11-20 – 2021-11-23 (×3): 10 mg via ORAL
  Filled 2021-11-20 (×4): qty 1

## 2021-11-20 MED ORDER — ROSUVASTATIN CALCIUM 20 MG PO TABS
40.0000 mg | ORAL_TABLET | Freq: Every day | ORAL | Status: DC
  Administered 2021-11-20 – 2021-11-22 (×3): 40 mg via ORAL
  Filled 2021-11-20 (×3): qty 2

## 2021-11-20 MED ORDER — ALBUTEROL SULFATE (2.5 MG/3ML) 0.083% IN NEBU: 3.0000 mL | INHALATION_SOLUTION | Freq: Four times a day (QID) | RESPIRATORY_TRACT | Status: DC | PRN

## 2021-11-20 MED ORDER — SORBITOL 70 % SOLN
960.0000 mL | TOPICAL_OIL | Freq: Once | ORAL | Status: AC
  Administered 2021-11-20: 960 mL via RECTAL
  Filled 2021-11-20: qty 473

## 2021-11-20 MED ORDER — APIXABAN 5 MG PO TABS
5.0000 mg | ORAL_TABLET | Freq: Two times a day (BID) | ORAL | Status: DC
  Filled 2021-11-20: qty 1

## 2021-11-20 MED ORDER — SODIUM CHLORIDE 0.9 % IV SOLN: 250.0000 mL | INTRAVENOUS | Status: DC | PRN

## 2021-11-20 MED ORDER — CARVEDILOL 3.125 MG PO TABS
3.1250 mg | ORAL_TABLET | Freq: Two times a day (BID) | ORAL | Status: DC
  Administered 2021-11-20 – 2021-11-23 (×7): 3.125 mg via ORAL
  Filled 2021-11-20 (×8): qty 1

## 2021-11-20 MED ORDER — METOPROLOL TARTRATE 5 MG/5ML IV SOLN: 5.0000 mg | INTRAVENOUS | Status: DC | PRN

## 2021-11-20 MED ORDER — AMITRIPTYLINE HCL 25 MG PO TABS
25.0000 mg | ORAL_TABLET | Freq: Every day | ORAL | Status: DC
  Administered 2021-11-20 – 2021-11-22 (×3): 25 mg via ORAL
  Filled 2021-11-20 (×3): qty 1

## 2021-11-20 MED ORDER — SODIUM CHLORIDE 0.9% FLUSH: 3.0000 mL | INTRAVENOUS | Status: DC | PRN

## 2021-11-20 MED ORDER — HYDRALAZINE HCL 20 MG/ML IJ SOLN: 10.0000 mg | INTRAMUSCULAR | Status: DC | PRN

## 2021-11-20 MED ORDER — LOSARTAN POTASSIUM 25 MG PO TABS
25.0000 mg | ORAL_TABLET | Freq: Every day | ORAL | Status: DC
  Administered 2021-11-20: 25 mg via ORAL
  Filled 2021-11-20: qty 1

## 2021-11-20 MED ORDER — METOCLOPRAMIDE HCL 5 MG/ML IJ SOLN
10.0000 mg | Freq: Four times a day (QID) | INTRAMUSCULAR | Status: DC
  Administered 2021-11-20 – 2021-11-23 (×12): 10 mg via INTRAVENOUS
  Filled 2021-11-20 (×12): qty 2

## 2021-11-20 MED ORDER — IPRATROPIUM-ALBUTEROL 0.5-2.5 (3) MG/3ML IN SOLN
3.0000 mL | RESPIRATORY_TRACT | Status: DC | PRN
Start: 2021-11-20 — End: 2021-11-23

## 2021-11-20 MED ORDER — PANTOPRAZOLE SODIUM 40 MG PO TBEC
40.0000 mg | DELAYED_RELEASE_TABLET | Freq: Every day | ORAL | Status: DC
Start: 2021-11-20 — End: 2021-11-23
  Administered 2021-11-20 – 2021-11-23 (×3): 40 mg via ORAL
  Filled 2021-11-20 (×3): qty 1

## 2021-11-20 MED ORDER — SENNOSIDES-DOCUSATE SODIUM 8.6-50 MG PO TABS: 1.0000 | ORAL_TABLET | Freq: Every evening | ORAL | Status: DC | PRN

## 2021-11-20 MED ORDER — POLYETHYLENE GLYCOL 3350 17 G PO PACK
17.0000 g | PACK | Freq: Two times a day (BID) | ORAL | Status: DC
  Administered 2021-11-20 – 2021-11-22 (×4): 17 g via ORAL
  Filled 2021-11-20 (×5): qty 1

## 2021-11-20 MED ORDER — ACETAMINOPHEN 650 MG RE SUPP: 650.0000 mg | Freq: Four times a day (QID) | RECTAL | Status: DC | PRN

## 2021-11-20 MED ORDER — BISACODYL 5 MG PO TBEC
10.0000 mg | DELAYED_RELEASE_TABLET | ORAL | Status: DC
  Administered 2021-11-20: 10 mg via ORAL
  Filled 2021-11-20 (×2): qty 2

## 2021-11-20 MED ORDER — INSULIN ASPART 100 UNIT/ML IJ SOLN
0.0000 [IU] | INTRAMUSCULAR | Status: DC
  Administered 2021-11-20 (×2): 2 [IU] via SUBCUTANEOUS
  Administered 2021-11-20: 7 [IU] via SUBCUTANEOUS
  Administered 2021-11-20: 2 [IU] via SUBCUTANEOUS
  Administered 2021-11-20: 1 [IU] via SUBCUTANEOUS
  Administered 2021-11-21 (×2): 2 [IU] via SUBCUTANEOUS
  Administered 2021-11-21: 1 [IU] via SUBCUTANEOUS
  Administered 2021-11-21: 2 [IU] via SUBCUTANEOUS
  Administered 2021-11-21 (×2): 3 [IU] via SUBCUTANEOUS
  Administered 2021-11-21: 2 [IU] via SUBCUTANEOUS
  Administered 2021-11-22: 1 [IU] via SUBCUTANEOUS
  Administered 2021-11-22: 2 [IU] via SUBCUTANEOUS
  Administered 2021-11-22: 3 [IU] via SUBCUTANEOUS
  Administered 2021-11-22: 2 [IU] via SUBCUTANEOUS
  Administered 2021-11-23: 3 [IU] via SUBCUTANEOUS
  Administered 2021-11-23: 5 [IU] via SUBCUTANEOUS
  Administered 2021-11-23: 3 [IU] via SUBCUTANEOUS
  Filled 2021-11-20: qty 0.09

## 2021-11-20 MED ORDER — ONDANSETRON HCL 4 MG/2ML IJ SOLN
4.0000 mg | Freq: Four times a day (QID) | INTRAMUSCULAR | Status: DC | PRN
  Administered 2021-11-20: 4 mg via INTRAVENOUS
  Filled 2021-11-20: qty 2

## 2021-11-20 MED ORDER — DONEPEZIL HCL 10 MG PO TABS
10.0000 mg | ORAL_TABLET | Freq: Every day | ORAL | Status: DC
  Administered 2021-11-20 – 2021-11-22 (×3): 10 mg via ORAL
  Filled 2021-11-20 (×3): qty 1

## 2021-11-20 MED ORDER — GUAIFENESIN 100 MG/5ML PO LIQD
5.0000 mL | ORAL | Status: DC | PRN
Start: 2021-11-20 — End: 2021-11-23

## 2021-11-20 MED ORDER — SORBITOL 70 % SOLN: 400.0000 mL | TOPICAL_OIL | Freq: Once | ORAL | Status: DC

## 2021-11-20 MED ORDER — SODIUM CHLORIDE 0.9% FLUSH
3.0000 mL | Freq: Two times a day (BID) | INTRAVENOUS | Status: DC
  Administered 2021-11-20 – 2021-11-22 (×6): 3 mL via INTRAVENOUS

## 2021-11-20 NOTE — Progress Notes (Signed)
   Patient Name: Calvin [name] Date of Encounter: 11/20/2021, 9:53 AM    Subjective  Having more nausea and vomiting.  Had success with a smog enema yesterday but now having some rectal bleeding.  Still with the same abdominal pain he described in the office.   Objective  BP (!) 166/98 (BP Location: Left Arm)   Pulse (!) 109   Temp 98.1 F (36.7 C) (Oral)   Resp 20   Ht 5' 5.5" (1.664 m)   Wt 82.5 kg   SpO2 100%   BMI 29.81 kg/m  Acutely ill and chronically ill black man, complaining of nausea and abdominal pain Abdomen is mildly distended diffusely tender as on exam yesterday but nothing worrisome like rebound or guarding.  Bowel sounds are present. Rectal exam without stool, nontender small amount of bright red blood on the finger.  Recent Labs  Lab 11/19/21 1248 11/20/21 0445  HGB 16.0 15.9  HCT 47.6 48.4  WBC 5.9 10.7*  PLT 209 205   Recent Labs  Lab 11/19/21 1248 11/20/21 0445  NA 135 139  K 4.5 4.6  CL 103 105  CO2 24 25  GLUCOSE 303* 319*  BUN 15 15  CREATININE 1.48* 1.34*  CALCIUM 9.5 9.4   Recent Labs  Lab 11/19/21 1248  AST 16  ALT 17  ALKPHOS 71  BILITOT 0.8  PROT 7.5  ALBUMIN 4.0   Abdominal film without distended bowel loops final reading pending, seems to be a significant amount of retained stool    Assessment and Plan  Exacerbation of chronic constipation, now with rectal bleeding that may be enema trauma he did get some relief with the smog enema  Nausea and vomiting persist  Chronic abdominal pain  Uncontrolled diabetes mellitus with neuropathy  Eliquis therapy-A-fib   He has had some benefit from enema the rectal bleeding may be trauma.  Discussed with Dr. Reesa Chew the hospitalist, I think we need to try to get him emptied out and most likely get a colonoscopy while here to clear the air.  He may need an upper endoscopy as well.   Administer another smog enema-ordered Stop Eliquis for now Reglan IV running I will  follow-up tomorrow  [name], MD, [facility] See Shea Evans on call - gastroenterology for best contact person 11/20/2021 9:53 AM

## 2021-11-20 NOTE — Progress Notes (Signed)
Pt cont to have sm amts of bloody mucous from rectum (bright red). Pt also continues w nausea as well. Attempted to give pt SMOG enema and pt is unable to hold it longer than 1 minute - return of enema w scant pieces of brown material and mucous. MD notified of above, will cont to monitor.

## 2021-11-20 NOTE — Progress Notes (Signed)
PROGRESS NOTE    Calvin [name]  OQH:[id number] DOB: 1961-04-13 DOA: 11/19/2021 PCP: Roselee Nova, MD   Brief Narrative:  61 year old with history of nonischemic cardiomyopathy EF 30%, paroxysmal A-fib on Eliquis, insulin-dependent DM 2, neurocognitive disorder, chronic abdominal pain and constipation came to the hospital from GI office due to complaints of nausea, vomiting and constipation.  Patient was given smog enema with some response but this morning started having significant nausea vomiting and rectal bleeding.   Assessment & Plan:  Principal Problem:   Constipation Active Problems:   AF (paroxysmal atrial fibrillation)   Type II diabetes mellitus   Mild neurocognitive disorder due to Alzheimer's disease, possible   Nonischemic cardiomyopathy   Stage 3a chronic kidney disease (CKD) (HCC)    Abdominal pain with nausea vomiting Bright red blood per rectum - Hemoglobin is stable for now but we will closely monitor as he is on anticoagulation.  Received smog enema yesterday.  GI team consulted and following.  Abdominal x-ray done-final read is pending.  Antiemetics as needed, clear liquid diet.  Pain control.  Further endoscopic work-up per gastroenterology team.  Chronic congestive heart failure with reduced ejection fraction, 30% - Currently patient is on losartan, statin, Coreg, Aldactone.  Eliquis will be held in anticipation for GI procedure and bright red blood per rectum.  Also on Farxiga  Diabetes mellitus type 2, insulin-dependent.  Uncontrolled due to hyperglycemia - Sliding scale and Accu-Cheks.  He is also on [place] for CHF  Hyperlipidemia - Statin  CKD stage IIIa - Creatinine around baseline.  Continue to monitor    DVT prophylaxis: Eliquis which will be held.  SCDs Code Status: Full code Family Communication:    Maintain hospital stay for endoscopic evaluation.  Subjective:  Patient having nausea vomiting and abdominal discomfort.  Also had  bright red blood per rectum overnight, 2 episodes   Examination:  Constitutional: Mild distress due to nausea.  Respiratory: Clear to auscultation bilaterally Cardiovascular: Normal sinus rhythm, no rubs Abdomen: mildly tender, + BS Musculoskeletal: No edema noted Skin: No rashes seen Neurologic: CN 2-12 grossly intact.  And nonfocal Psychiatric: Normal judgment and insight. Alert and oriented x 3. Normal mood.   Objective: Vitals:   11/19/21 2118 11/20/21 0022 11/20/21 0159 11/20/21 0609  BP: (!) 148/125 (!) 160/78 (!) 174/102 (!) 166/98  Pulse: (!) 111 (!) 101 (!) 103 (!) 109  Resp: 19  16 20   Temp:   97.6 F (36.4 C) 98.1 F (36.7 C)  TempSrc:   Oral Oral  SpO2: 96% 99% 100% 100%  Weight:    82.5 kg  Height:        Intake/Output Summary (Last 24 hours) at 11/20/2021 0943 Last data filed at 11/20/2021 0932 Gross per 24 hour  Intake 140 ml  Output 850 ml  Net -710 ml   Filed Weights   11/19/21 1220 11/20/21 0609  Weight: 84.4 kg 82.5 kg     Data Reviewed:   CBC: Recent Labs  Lab 11/19/21 1248 11/20/21 0445  WBC 5.9 10.7*  NEUTROABS 3.2  --   HGB 16.0 15.9  HCT 47.6 48.4  MCV 87.0 86.6  PLT 209 546   Basic Metabolic Panel: Recent Labs  Lab 11/19/21 1248 11/20/21 0445  NA 135 139  K 4.5 4.6  CL 103 105  CO2 24 25  GLUCOSE 303* 319*  BUN 15 15  CREATININE 1.48* 1.34*  CALCIUM 9.5 9.4   GFR: Estimated Creatinine Clearance: 57.8 mL/min (A) (by  C-G formula based on SCr of 1.34 mg/dL (H)). Liver Function Tests: Recent Labs  Lab 11/19/21 1248  AST 16  ALT 17  ALKPHOS 71  BILITOT 0.8  PROT 7.5  ALBUMIN 4.0   Recent Labs  Lab 11/19/21 1248  LIPASE 36   No results for input(s): AMMONIA in the last 168 hours. Coagulation Profile: No results for input(s): INR, PROTIME in the last 168 hours. Cardiac Enzymes: No results for input(s): CKTOTAL, CKMB, CKMBINDEX, TROPONINI in the last 168 hours. BNP (last 3 results) No results for input(s):  PROBNP in the last 8760 hours. HbA1C: No results for input(s): HGBA1C in the last 72 hours. CBG: Recent Labs  Lab 11/20/21 0345 11/20/21 0740  GLUCAP 314* 193*   Lipid Profile: No results for input(s): CHOL, HDL, LDLCALC, TRIG, CHOLHDL, LDLDIRECT in the last 72 hours. Thyroid Function Tests: No results for input(s): TSH, T4TOTAL, FREET4, T3FREE, THYROIDAB in the last 72 hours. Anemia Panel: No results for input(s): VITAMINB12, FOLATE, FERRITIN, TIBC, IRON, RETICCTPCT in the last 72 hours. Sepsis Labs: No results for input(s): PROCALCITON, LATICACIDVEN in the last 168 hours.  No results found for this or any previous visit (from the past 240 hour(s)).       Radiology Studies: No results found.      Scheduled Meds:  amitriptyline  25 mg Oral QHS   apixaban  5 mg Oral BID   bisacodyl  10 mg Oral QODAY   carvedilol  3.125 mg Oral BID WC   dapagliflozin propanediol  10 mg Oral q morning   donepezil  10 mg Oral QHS   insulin aspart  0-9 Units Subcutaneous Q4H   losartan  25 mg Oral Daily   pantoprazole  40 mg Oral Daily   polyethylene glycol  17 g Oral BID   rosuvastatin  40 mg Oral QHS   sodium chloride flush  3 mL Intravenous Q12H   spironolactone  25 mg Oral Daily   Continuous Infusions:  sodium chloride       LOS: 0 days   Time spent= 35 mins    Valon Glasscock Arsenio Loader, MD [facility]  If 7PM-7AM, please contact night-coverage  11/20/2021, 9:43 AM

## 2021-11-20 NOTE — Plan of Care (Signed)
  Problem: Elimination: Goal: Will not experience complications related to bowel motility Outcome: Progressing

## 2021-11-20 NOTE — ED Notes (Signed)
Pt has had a extra large bowel movement since the completion of the enema.

## 2021-11-20 NOTE — H&P (Signed)
History and Physical    Calvin [name] WCB:[id number] DOB: 1960/12/05 DOA: 11/19/2021  PCP: Roselee Nova, MD   Patient coming from: Home   Chief Complaint: Abdominal pain, N/V, constipation   HPI: Calvin [name] is a pleasant 61 y.o. male with medical history significant for nonischemic cardiomyopathy with EF 30 to 35%, paroxysmal atrial fibrillation on Eliquis, insulin-dependent diabetes mellitus, mild neurocognitive disorder, chronic abdominal pain, and chronic constipation, now presenting to the emergency department from his GI clinic for evaluation of severe constipation with increased pain, nausea, and vomiting.  Patient has had worsening in his chronic constipation over the past 3 weeks or more despite using Dulcolax and MiraLAX at home, has not had a normal BM and 2 to 3 weeks, and has been vomiting and having difficulty keeping his medications down for the past 1 week.  He denies fevers, chills, cough, shortness of breath, or chest pain.  He was seen at his GI clinic today, he was noted to be failing outpatient management, and was advised to present to the emergency department.  He had a CT of the abdomen and pelvis during the course of this illness which was essentially unremarkable.  ED Course: Upon arrival to the ED, patient is found to be afebrile and saturating well on room air with stable blood pressure.  Chemistry panel notable for glucose 203 and creatinine 1.48.  CBC unremarkable.  Patient was given IV fluids, fentanyl, and smog enema in the ED.  Review of Systems:  All other systems reviewed and apart from HPI, are negative.  Past Medical History:  Diagnosis Date   Adenoidal hypertrophy    Adrenal nodule 09/21/2020   AF (paroxysmal atrial fibrillation) 03/10/2018   AKI (acute kidney injury) 06/17/2019   Arthritis    Cataract    Mixed OU   Chronic anticoagulation 61/60/7371   Chronic systolic heart failure 12/28/9483   Coronary artery disease    a. cath in  08/2017 showing mild nonobstructive CAD with scattered 20-30% stenosis.    Diabetic neuropathy 09/26/2017   Diabetic retinopathy    NPDR OU   Essential hypertension 11/22/2015   Foot pain, bilateral 02/01/2019   Gastroesophageal reflux disease 05/10/2017   Heart attack    While living in [place]   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 EF 20-25% by echo in 09/2017 with cath showing mild CAD. b.  Last echo 12/2017 EF 35-40%, grade 2 DD.   Obesity    Onychomycosis of multiple toenails with type 2 diabetes mellitus and peripheral neuropathy 05/10/2017   OSA on CPAP 05/10/2017   Right sided weakness 06/17/2019   Type II diabetes mellitus 11/20/2015   Unsteady gait 12/20/2018    Past Surgical History:  Procedure Laterality Date   COLONOSCOPY  05/13/11, 08/2017   9 adenomas   FOREARM SURGERY     MUSCLE BIOPSY     POLYPECTOMY     RIGHT/LEFT HEART CATH AND CORONARY ANGIOGRAPHY N/A 08/25/2017   Procedure: RIGHT/LEFT HEART CATH AND CORONARY ANGIOGRAPHY;  Surgeon: [name], MD;  Location: [place] [facility];  Service: Cardiovascular;  Laterality: N/A;   RIGHT/LEFT HEART CATH AND CORONARY ANGIOGRAPHY N/A 10/01/2019  Procedure: RIGHT/LEFT HEART CATH AND CORONARY ANGIOGRAPHY;  Surgeon: [name], MD;  Location: [place] [facility];  Service: Cardiovascular;  Laterality: N/A;   RIGHT/LEFT HEART CATH AND CORONARY ANGIOGRAPHY N/A 04/07/2021   Procedure: RIGHT/LEFT HEART CATH AND CORONARY ANGIOGRAPHY;  Surgeon: [name], MD;  Location: [place] [facility];  Service: Cardiovascular;  Laterality: N/A;    Social History:    reports that he has never smoked. He has never used smokeless tobacco. He reports that he does not drink alcohol and does not use drugs.  No Known Allergies  Family History  Problem Relation Age of Onset   Heart disease Mother    Heart attack Mother 71   Hypertension Mother    Hyperlipidemia Mother    Diabetes Mother    Heart disease Father    Heart attack Father 51   Hypertension Father    Hyperlipidemia Father    Diabetes Father    Heart attack Sister 75   Colon cancer Neg Hx    Stomach cancer Neg Hx    Esophageal cancer Neg Hx    Rectal cancer Neg Hx    Colon polyps Neg Hx      Prior to Admission medications   Medication Sig Start Date End Date Taking? Authorizing Provider  albuterol (VENTOLIN HFA) 108 (90 Base) MCG/ACT inhaler Inhale 2 puffs into the lungs every 6 (six) hours as needed for wheezing or shortness of breath. Patient not taking: Reported on 11/19/2021 06/30/19   Thurnell Lose, MD  amitriptyline (ELAVIL) 25 MG tablet Take 25 mg by mouth at bedtime. Patient not taking: Reported on 11/19/2021 08/16/21   [provider]  bisacodyl (DULCOLAX) 5 MG EC tablet Take 2 tablets (10 mg total) by mouth every other day. Take at bedtime Patient not taking: Reported on 11/19/2021 12/04/20   [name], MD  Blood Glucose Monitoring Suppl ([place]) w/Device KIT See admin instructions. Patient not taking: Reported on 11/19/2021 10/28/21   [provider]  carvedilol (COREG) 3.125 MG tablet Take 1 tablet (3.125 mg total) by mouth 2 (two) times daily with a meal. Patient not taking: Reported on 11/19/2021 07/12/21   [name], [name], MD  donepezil (ARICEPT) 10 MG tablet Take 1 tablet (10 mg total) by mouth at bedtime. Patient not taking: Reported on 11/19/2021 03/31/21   Sharene Butters E, [name]  ELIQUIS 5 MG TABS tablet TAKE ONE TABLET BY MOUTH EVERY MORNING and TAKE ONE TABLET BY MOUTH EVERYDAY AT BEDTIME Patient not taking: Reported on 11/19/2021  10/07/21   [name], [name], MD  ENTRESTO 24-26 MG Take 1 tablet by mouth at bedtime. Patient not taking: Reported on 11/19/2021 10/28/21   [provider]  ezetimibe (ZETIA) 10 MG tablet TAKE ONE TABLET BY MOUTH EVERY MORNING Patient not taking: Reported on 11/19/2021 05/13/21   [name], [name], MD  FARXIGA 10 MG TABS tablet TAKE ONE TABLET BY MOUTH EVERY MORNING Patient not taking: Reported on 11/19/2021 06/14/21   [name], [name], MD  gabapentin (NEURONTIN) 100 MG capsule TAKE ONE CAPSULE BY MOUTH EVERY MORNING and TAKE ONE CAPSULE BY MOUTH AT NOON and TAKE ONE CAPSULE BY MOUTH EVERYDAY AT BEDTIME Patient not taking: Reported on 11/19/2021 08/16/21   [name], NP  glucose blood (ONETOUCH VERIO) test strip 1 each by Other route 2 (two) times daily. And lancets 2/day Patient not taking: Reported on 11/19/2021 09/15/21   [name], MD  insulin degludec [facility])  200 UNIT/ML FlexTouch Pen Inject 54 Units into the skin daily. And pen needles 1/day Patient not taking: Reported on 11/19/2021 09/15/21   [name], MD  Insulin Syringe-Needle U-100 (INSULIN SYRINGE .3CC/29GX1/2") 29G X 1/2" 0.3 ML MISC Inject 1 Syringe 3 (three) times daily as directed. Check blood sugar three times daily. Dx: E11.9 Patient not taking: Reported on 11/19/2021 05/10/17   [name], DO  Lancets (ONETOUCH DELICA PLUS LSLHTD42A) [place] Patient not taking: Reported on 11/19/2021 10/30/19   [name], MD  losartan (COZAAR) 25 MG tablet Take 1 tablet (25 mg total) by mouth daily. Patient not taking: Reported on 11/19/2021 09/30/21 12/29/21  [name], [name], MD  nitroGLYCERIN (NITROSTAT) 0.4 MG SL tablet Place 1 tablet (0.4 mg total) under the tongue every 5 (five) minutes as needed for chest pain. Patient not taking: Reported on 10/28/2021 10/27/17   [name], DO  ondansetron (ZOFRAN-ODT) 4 MG disintegrating tablet Take 1 tablet (4 mg total) by mouth every 8 (eight)  hours as needed for nausea or vomiting. Patient not taking: Reported on 11/19/2021 09/05/21   Davonna Belling, MD  pantoprazole (PROTONIX) 40 MG tablet TAKE ONE TABLET BY MOUTH ONCE DAILY Patient not taking: Reported on 11/19/2021 07/12/21   [name], NP  polyethylene glycol (MIRALAX / GLYCOLAX) 17 g packet Take 17 g by mouth 2 (two) times daily. Patient not taking: Reported on 11/19/2021 10/06/20   [name], MD  REPATHA 140 MG/ML SOSY INJECT 1 PEN INTO THE SKIN EVERY 14 DAYS Patient not taking: Reported on 11/19/2021 09/08/20   [name], [name], MD  rosuvastatin (CRESTOR) 40 MG tablet Take 40 mg by mouth at bedtime. Patient not taking: Reported on 11/19/2021 08/16/21   [provider]  sildenafil (VIAGRA) 100 MG tablet Take 1 tablet (100 mg total) by mouth daily as needed for erectile dysfunction. Patient not taking: Reported on 11/19/2021 09/15/21   [name], MD  spironolactone (ALDACTONE) 25 MG tablet Take 1 tablet (25 mg total) by mouth daily. Patient not taking: Reported on 11/19/2021 10/15/20   Consuelo Pandy, [name]  traMADol Veatrice Bourbon) 50 MG tablet SMARTSIG:1 Tablet(s) By Mouth Every 8-12 Hours PRN Patient not taking: Reported on 11/19/2021 11/08/21   [provider]    Physical Exam: Vitals:   11/19/21 2100 11/19/21 2115 11/19/21 2118 11/20/21 0022  BP: (!) 145/89 (!) 148/125 (!) 148/125 (!) 160/78  Pulse:   (!) 111 (!) 101  Resp:   19   Temp:      TempSrc:      SpO2:   96% 99%  Weight:      Height:        Constitutional: NAD, calm  Eyes: PERTLA, lids and conjunctivae normal ENMT: Mucous membranes are moist. Posterior pharynx clear of any exudate or lesions.   Neck: supple, no masses  Respiratory: no wheezing, no crackles. No accessory muscle use.  Cardiovascular: S1 & S2 heard, regular rate and rhythm. No extremity edema.  Abdomen: soft, tender on right without rebound pain or guarding. Bowel sounds active.  Musculoskeletal: no clubbing /  cyanosis. No joint deformity upper and lower extremities.   Skin: no significant rashes, lesions, ulcers. Warm, dry, well-perfused. Neurologic: CN 2-12 grossly intact. Moving all extremities. Alert and oriented.  Psychiatric: Very pleasant. Cooperative.    Labs and Imaging on Admission: I have personally reviewed following labs and imaging studies  CBC: Recent Labs  Lab 11/19/21 1248  WBC 5.9  NEUTROABS 3.2  HGB 16.0  HCT 47.6  MCV 87.0  PLT 937   Basic Metabolic Panel: Recent Labs  Lab 11/19/21 1248  NA 135  K 4.5  CL 103  CO2 24  GLUCOSE 303*  BUN 15  CREATININE 1.48*  CALCIUM 9.5   GFR: Estimated Creatinine Clearance: 52.9 mL/min (A) (by C-G formula based on SCr of 1.48 mg/dL (H)). Liver Function Tests: Recent Labs  Lab 11/19/21 1248  AST 16  ALT 17  ALKPHOS 71  BILITOT 0.8  PROT 7.5  ALBUMIN 4.0   Recent Labs  Lab 11/19/21 1248  LIPASE 36   No results for input(s): AMMONIA in the last 168 hours. Coagulation Profile: No results for input(s): INR, PROTIME in the last 168 hours. Cardiac Enzymes: No results for input(s): CKTOTAL, CKMB, CKMBINDEX, TROPONINI in the last 168 hours. BNP (last 3 results) No results for input(s): PROBNP in the last 8760 hours. HbA1C: No results for input(s): HGBA1C in the last 72 hours. CBG: No results for input(s): GLUCAP in the last 168 hours. Lipid Profile: No results for input(s): CHOL, HDL, LDLCALC, TRIG, CHOLHDL, LDLDIRECT in the last 72 hours. Thyroid Function Tests: No results for input(s): TSH, T4TOTAL, FREET4, T3FREE, THYROIDAB in the last 72 hours. Anemia Panel: No results for input(s): VITAMINB12, FOLATE, FERRITIN, TIBC, IRON, RETICCTPCT in the last 72 hours. Urine analysis:    Component Value Date/Time   COLORURINE STRAW (A) 09/05/2021 0927   APPEARANCEUR CLEAR 09/05/2021 0927   LABSPEC 1.024 09/05/2021 0927   PHURINE 6.0 09/05/2021 0927   GLUCOSEU >=500 (A) 09/05/2021 0927   HGBUR SMALL (A) 09/05/2021  0927   BILIRUBINUR NEGATIVE 09/05/2021 0927   KETONESUR NEGATIVE 09/05/2021 0927   PROTEINUR NEGATIVE 09/05/2021 0927   UROBILINOGEN 0.2 11/28/2012 1857   NITRITE NEGATIVE 09/05/2021 0927   LEUKOCYTESUR NEGATIVE 09/05/2021 0927   Sepsis Labs: @LABRCNTIP (procalcitonin:4,lacticidven:4) )No results found for this or any previous visit (from the past 240 hour(s)).   Radiological Exams on Admission: No results found.   Assessment/Plan   1. Severe constipation; N/V  - Pt with chronic abd pain and chronic constipation presents with worsening pain, constipation, and N/V despite outpatient treatment   - Vomiting in ED initially but has had good response to SMOG enema and beginning to feel better   - Allow clears as tolerated, resume MiraLax and Dulcolax   2. Chronic combined systolic and diastolic CHF  - NICM with EF 30-35% on TTE in August 2022  - Appears compensated  - Monitor volume status and continue Aldactone, losartan, Coreg, and Farxiga  3. PAF  - Continue Eliquis    4. CKD IIIa  - SCr is 1.48 on admission, appears to be his baseline  - Renally-dose medications, monitor    5. Insulin-dependent DM  - A1c was 10.4% March 2023  - Continue CBG checks and insulin    DVT prophylaxis: Eliquis  Code Status: Full  Level of Care: Level of care: Med-Surg Family Communication: None present  Disposition Plan:  Patient is from: Home  Anticipated d/c is to: Home  Anticipated d/c date is: 5/20 or 11/21/21  Patient currently: pending tolerance of adequate oral intake  Consults called: None  Admission status: Observation     Vianne Bulls, MD [facility]  11/20/2021, 12:51 AM

## 2021-11-21 DIAGNOSIS — K625 Hemorrhage of anus and rectum: Secondary | ICD-10-CM | POA: Diagnosis not present

## 2021-11-21 DIAGNOSIS — I428 Other cardiomyopathies: Secondary | ICD-10-CM | POA: Diagnosis not present

## 2021-11-21 DIAGNOSIS — K5909 Other constipation: Secondary | ICD-10-CM | POA: Diagnosis not present

## 2021-11-21 DIAGNOSIS — R1115 Cyclical vomiting syndrome unrelated to migraine: Secondary | ICD-10-CM | POA: Diagnosis not present

## 2021-11-21 DIAGNOSIS — K5900 Constipation, unspecified: Secondary | ICD-10-CM | POA: Diagnosis not present

## 2021-11-21 DIAGNOSIS — I480 Paroxysmal atrial fibrillation: Secondary | ICD-10-CM | POA: Diagnosis not present

## 2021-11-21 DIAGNOSIS — G309 Alzheimer's disease, unspecified: Secondary | ICD-10-CM | POA: Diagnosis not present

## 2021-11-21 LAB — CBC
HCT: 43.7 % (ref 39.0–52.0)
Hemoglobin: 14.6 g/dL (ref 13.0–17.0)
MCH: 29.1 pg (ref 26.0–34.0)
MCHC: 33.4 g/dL (ref 30.0–36.0)
MCV: 87.1 fL (ref 80.0–100.0)
Platelets: 204 10*3/uL (ref 150–400)
RBC: 5.02 MIL/uL (ref 4.22–5.81)
RDW: 14.2 % (ref 11.5–15.5)
WBC: 14 10*3/uL — ABNORMAL HIGH (ref 4.0–10.5)
nRBC: 0 % (ref 0.0–0.2)

## 2021-11-21 LAB — BASIC METABOLIC PANEL
Anion gap: 8 (ref 5–15)
BUN: 14 mg/dL (ref 8–23)
CO2: 24 mmol/L (ref 22–32)
Calcium: 9 mg/dL (ref 8.9–10.3)
Chloride: 106 mmol/L (ref 98–111)
Creatinine, Ser: 1.67 mg/dL — ABNORMAL HIGH (ref 0.61–1.24)
GFR, Estimated: 46 mL/min — ABNORMAL LOW (ref >60)
Glucose, Bld: 181 mg/dL — ABNORMAL HIGH (ref 70–99)
Potassium: 4 mmol/L (ref 3.5–5.1)
Sodium: 138 mmol/L (ref 135–145)

## 2021-11-21 LAB — GLUCOSE, CAPILLARY
Glucose-Capillary: 142 mg/dL — ABNORMAL HIGH (ref 70–99)
Glucose-Capillary: 152 mg/dL — ABNORMAL HIGH (ref 70–99)
Glucose-Capillary: 165 mg/dL — ABNORMAL HIGH (ref 70–99)
Glucose-Capillary: 175 mg/dL — ABNORMAL HIGH (ref 70–99)
Glucose-Capillary: 193 mg/dL — ABNORMAL HIGH (ref 70–99)
Glucose-Capillary: 210 mg/dL — ABNORMAL HIGH (ref 70–99)
Glucose-Capillary: 210 mg/dL — ABNORMAL HIGH (ref 70–99)

## 2021-11-21 LAB — MAGNESIUM: Magnesium: 2.1 mg/dL (ref 1.7–2.4)

## 2021-11-21 MED ORDER — PEG-KCL-NACL-NASULF-NA ASC-C 100 G PO SOLR: 0.5000 | Freq: Once | ORAL | Status: DC

## 2021-11-21 MED ORDER — PEG-KCL-NACL-NASULF-NA ASC-C 100 G PO SOLR
0.5000 | Freq: Once | ORAL | Status: AC
  Administered 2021-11-21 (×2): 100 g via ORAL

## 2021-11-21 MED ORDER — PEG-KCL-NACL-NASULF-NA ASC-C 100 G PO SOLR
0.5000 | Freq: Once | ORAL | Status: DC
Start: 2021-11-21 — End: 2021-11-21

## 2021-11-21 MED ORDER — BISACODYL 5 MG PO TBEC
20.0000 mg | DELAYED_RELEASE_TABLET | Freq: Once | ORAL | Status: AC
  Administered 2021-11-21: 20 mg via ORAL
  Filled 2021-11-21: qty 4

## 2021-11-21 MED ORDER — PEG-KCL-NACL-NASULF-NA ASC-C 100 G PO SOLR
0.5000 | Freq: Once | ORAL | Status: AC
  Filled 2021-11-21: qty 1

## 2021-11-21 NOTE — Progress Notes (Signed)
   Patient Name: Calvin [name] Date of Encounter: 11/21/2021, 10:44 AM    Subjective  Feeling better.  Tolerating liquids no vomiting less nausea less abdominal pain.  Could not retain the smog enema yesterday.  Still having rectal bleeding.   Objective  BP 94/66 (BP Location: Right Arm)   Pulse 86   Temp 98.5 F (36.9 C) (Oral)   Resp 18   Ht 5' 5.5" (1.664 m)   Wt 84.1 kg   SpO2 100%   BMI 30.38 kg/m  Chronically ill no acute distress Lungs clear Heart sounds normal Abdomen soft much less tender mildly so in the lower quadrants only today bowel sounds present Rectal not repeated no mass yesterday though blood on the finger he continues to drip some blood into the bedding  Recent Labs  Lab 11/19/21 1248 11/20/21 0445 11/21/21 0342  HGB 16.0 15.9 14.6  HCT 47.6 48.4 43.7  WBC 5.9 10.7* 14.0*  PLT 209 205 204    Recent Labs  Lab 11/19/21 1248 11/20/21 0445 11/21/21 0342  NA 135 139 138  K 4.5 4.6 4.0  CL 103 105 106  CO2 24 25 24   GLUCOSE 303* 319* 181*  BUN 15 15 14   CREATININE 1.48* 1.34* 1.67*  CALCIUM 9.5 9.4 9.0  MG  --   --  2.1    Recent Labs  Lab 11/19/21 1248  AST 16  ALT 17  ALKPHOS 71  BILITOT 0.8  PROT 7.5  ALBUMIN 4.0      Assessment and Plan  Chronic constipation with exacerbation  Rectal bleeding question enema trauma, I suppose stercoral ulcer might be possible question other pathology  History of colon polyps overdue for surveillance-9 adenomas in 2012, 5 diminutive adenomas 2019 failed prep 2022 no exam  Nausea and vomiting improved on running Reglan  A-fib and severe CHF on Eliquis, currently held since admission  ------------------------------------------------------------------------------------------------------------  Plan for EGD and colonoscopy tomorrow by Dr. Henrene Pastor.  Many of his issues may be diabetic neuropathy in origin but need to evaluate for other causes. Given comorbidities depending upon findings  would most likely not be inclined to continue surveillance colonoscopy exams.  The risks and benefits as well as alternatives of endoscopic procedure(s) have been discussed and reviewed. All questions answered. The patient agrees to proceed. Appreciate [facility] help  [name], MD, [facility] See Shea Evans on call - gastroenterology for best contact person 11/21/2021 10:44 AM

## 2021-11-21 NOTE — H&P (View-Only) (Signed)
   Patient Name: Calvin [name] Date of Encounter: 11/21/2021, 10:44 AM    Subjective  Feeling better.  Tolerating liquids no vomiting less nausea less abdominal pain.  Could not retain the smog enema yesterday.  Still having rectal bleeding.   Objective  BP 94/66 (BP Location: Right Arm)   Pulse 86   Temp 98.5 F (36.9 C) (Oral)   Resp 18   Ht 5' 5.5" (1.664 m)   Wt 84.1 kg   SpO2 100%   BMI 30.38 kg/m  Chronically ill no acute distress Lungs clear Heart sounds normal Abdomen soft much less tender mildly so in the lower quadrants only today bowel sounds present Rectal not repeated no mass yesterday though blood on the finger he continues to drip some blood into the bedding  Recent Labs  Lab 11/19/21 1248 11/20/21 0445 11/21/21 0342  HGB 16.0 15.9 14.6  HCT 47.6 48.4 43.7  WBC 5.9 10.7* 14.0*  PLT 209 205 204    Recent Labs  Lab 11/19/21 1248 11/20/21 0445 11/21/21 0342  NA 135 139 138  K 4.5 4.6 4.0  CL 103 105 106  CO2 24 25 24   GLUCOSE 303* 319* 181*  BUN 15 15 14   CREATININE 1.48* 1.34* 1.67*  CALCIUM 9.5 9.4 9.0  MG  --   --  2.1    Recent Labs  Lab 11/19/21 1248  AST 16  ALT 17  ALKPHOS 71  BILITOT 0.8  PROT 7.5  ALBUMIN 4.0      Assessment and Plan  Chronic constipation with exacerbation  Rectal bleeding question enema trauma, I suppose stercoral ulcer might be possible question other pathology  History of colon polyps overdue for surveillance-9 adenomas in 2012, 5 diminutive adenomas 2019 failed prep 2022 no exam  Nausea and vomiting improved on running Reglan  A-fib and severe CHF on Eliquis, currently held since admission  ------------------------------------------------------------------------------------------------------------  Plan for EGD and colonoscopy tomorrow by Dr. Henrene Pastor.  Many of his issues may be diabetic neuropathy in origin but need to evaluate for other causes. Given comorbidities depending upon findings  would most likely not be inclined to continue surveillance colonoscopy exams.  The risks and benefits as well as alternatives of endoscopic procedure(s) have been discussed and reviewed. All questions answered. The patient agrees to proceed. Appreciate TRH help  [name], MD, [facility] Gastroenterology See Shea Evans on call - gastroenterology for best contact person 11/21/2021 10:44 AM

## 2021-11-21 NOTE — Progress Notes (Signed)
PROGRESS NOTE    Calvin [name]  MAU:[id number] DOB: Jul 21, 1960 DOA: 11/19/2021 PCP: Roselee Nova, MD   Brief Narrative:  61 year old with history of nonischemic cardiomyopathy EF 30%, paroxysmal A-fib on Eliquis, insulin-dependent DM 2, neurocognitive disorder, chronic abdominal pain and constipation came to the hospital from GI office due to complaints of nausea, vomiting and constipation.  Patient was given smog enema with some response but this morning started having significant nausea vomiting and rectal bleeding.  Gastroenterology is planning colonoscopy.   Assessment & Plan:  Principal Problem:   Constipation Active Problems:   AF (paroxysmal atrial fibrillation)   Type II diabetes mellitus   Mild neurocognitive disorder due to Alzheimer's disease, possible   Nonischemic cardiomyopathy   Stage 3a chronic kidney disease (CKD) (HCC)   Rectal bleeding   Persistent vomiting    Abdominal pain with nausea vomiting Bright red blood per rectum - Hemoglobin is stable for now but we will closely monitor as he is on anticoagulation.  Received smog enema yesterday.  GI team consulted and following.  Abdominal x-ray done-overall normal bowel pattern.  Tolerating orals this morning.  GI planning colonoscopy.  Leukocytosis - Likely reactive.  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  Holding Aldactone and losartan due to slight rise in creatinine..  Eliquis will be held in anticipation for GI procedure and bright red blood per rectum.  Also on Farxiga  Diabetes mellitus type 2, insulin-dependent.  Uncontrolled due to hyperglycemia - Sliding scale and Accu-Cheks.  He is also on [place] for CHF  Hyperlipidemia - Statin  CKD stage IIIa - Creatinine around baseline.  Continue to monitor.  Due to rising creatinine we will hold off on  Aldactone and losartan today    DVT prophylaxis: Eliquis which will be held.  SCDs Code Status: Full code Family Communication:    Maintain hospital stay for endoscopic evaluation.  Subjective: Patient seen and examined at bedside.  Tells me he feels much better today, denies any nausea vomiting since last night.   Examination:  Constitutional: Mild distress Respiratory: Clear to auscultation bilaterally Cardiovascular: Normal sinus rhythm, no rubs Abdomen: Nontender nondistended good bowel sounds Musculoskeletal: No edema noted Skin: No rashes seen Neurologic: CN 2-12 grossly intact.  And nonfocal Psychiatric: Normal judgment and insight. Alert and oriented x 3. Normal mood.     Objective: Vitals:   11/20/21 2024 11/21/21 0039 11/21/21 0500 11/21/21 0534  BP: 137/71 114/73  94/66  Pulse: (!) 101 83  86  Resp: 18 18  18   Temp: 98.4 F (36.9 C) 99.1 F (37.3 C)  98.5 F (36.9 C)  TempSrc: Oral Oral  Oral  SpO2: 100% 99%  100%  Weight:   84.1 kg   Height:        Intake/Output Summary (Last 24 hours) at 11/21/2021 1007 Last data filed at 11/21/2021 0800 Gross per 24 hour  Intake 1110 ml  Output 1400 ml  Net -290 ml   Filed Weights   11/19/21 1220 11/20/21 0609 11/21/21 0500  Weight: 84.4 kg 82.5 kg 84.1 kg     Data Reviewed:   CBC: Recent Labs  Lab 11/19/21 1248 11/20/21 0445 11/21/21 0342  WBC 5.9 10.7* 14.0*  NEUTROABS 3.2  --   --   HGB 16.0 15.9 14.6  HCT 47.6 48.4 43.7  MCV 87.0 86.6 87.1  PLT 209 205 712   Basic Metabolic Panel: Recent Labs  Lab 11/19/21 1248 11/20/21 0445 11/21/21 0342  NA 135 139 138  K 4.5 4.6 4.0  CL 103 105 106  CO2 24 25 24   GLUCOSE 303* 319* 181*  BUN 15 15 14   CREATININE 1.48* 1.34* 1.67*  CALCIUM 9.5 9.4 9.0  MG  --   --  2.1   GFR: Estimated Creatinine Clearance: 46.8 mL/min (A) (by C-G formula based on SCr of 1.67 mg/dL (H)). Liver Function Tests: Recent Labs  Lab 11/19/21 1248  AST 16  ALT 17   ALKPHOS 71  BILITOT 0.8  PROT 7.5  ALBUMIN 4.0   Recent Labs  Lab 11/19/21 1248  LIPASE 36   No results for input(s): AMMONIA in the last 168 hours. Coagulation Profile: No results for input(s): INR, PROTIME in the last 168 hours. Cardiac Enzymes: No results for input(s): CKTOTAL, CKMB, CKMBINDEX, TROPONINI in the last 168 hours. BNP (last 3 results) No results for input(s): PROBNP in the last 8760 hours. HbA1C: No results for input(s): HGBA1C in the last 72 hours. CBG: Recent Labs  Lab 11/20/21 1709 11/20/21 1949 11/21/21 0000 11/21/21 0426 11/21/21 0732  GLUCAP 163* 170* 175* 193* 142*   Lipid Profile: No results for input(s): CHOL, HDL, LDLCALC, TRIG, CHOLHDL, LDLDIRECT in the last 72 hours. Thyroid Function Tests: No results for input(s): TSH, T4TOTAL, FREET4, T3FREE, THYROIDAB in the last 72 hours. Anemia Panel: No results for input(s): VITAMINB12, FOLATE, FERRITIN, TIBC, IRON, RETICCTPCT in the last 72 hours. Sepsis Labs: No results for input(s): PROCALCITON, LATICACIDVEN in the last 168 hours.  No results found for this or any previous visit (from the past 240 hour(s)).       Radiology Studies: DG Abd 2 Views  Result Date: 11/20/2021 CLINICAL DATA:  Constipation EXAM: ABDOMEN - 2 VIEW COMPARISON:  11/07/2021 FINDINGS: Bowel gas pattern is nonspecific. Small amount of stool is seen in the colon. There is no fecal impaction in the rectosigmoid. No abnormal masses or calcifications are seen. IMPRESSION: Nonspecific bowel gas pattern. Small stool burden in the colon. There is no fecal impaction in the rectosigmoid. Electronically Signed   By: Elmer Picker M.D.   On: 11/20/2021 10:35        Scheduled Meds:  amitriptyline  25 mg Oral QHS   bisacodyl  10 mg Oral QODAY   carvedilol  3.125 mg Oral BID WC   dapagliflozin propanediol  10 mg Oral q morning   donepezil  10 mg Oral QHS   insulin aspart  0-9 Units Subcutaneous Q4H   metoCLOPramide (REGLAN)  injection  10 mg Intravenous Q6H   pantoprazole  40 mg Oral Daily   polyethylene glycol  17 g Oral BID   rosuvastatin  40 mg Oral QHS   sodium chloride flush  3 mL Intravenous Q12H   Continuous Infusions:  sodium chloride       LOS: 1 day   Time spent= 35 mins    Kaien Pezzullo Arsenio Loader, MD [facility]  If 7PM-7AM, please contact night-coverage  11/21/2021, 10:07 AM

## 2021-11-22 ENCOUNTER — Encounter (HOSPITAL_COMMUNITY)
Admission: EM | Disposition: A | Discharge status: Home / Self Care | Payer: Self-pay | Source: Ambulatory Visit | Attending: Internal Medicine

## 2021-11-22 ENCOUNTER — Inpatient Hospital Stay (HOSPITAL_COMMUNITY): Payer: Medicare Other | Admitting: Certified Registered Nurse Anesthetist

## 2021-11-22 ENCOUNTER — Encounter (HOSPITAL_COMMUNITY): Payer: Self-pay | Admitting: Family Medicine

## 2021-11-22 ENCOUNTER — Encounter (INDEPENDENT_AMBULATORY_CARE_PROVIDER_SITE_OTHER): Payer: Medicare Other | Admitting: Ophthalmology

## 2021-11-22 DIAGNOSIS — R630 Anorexia: Secondary | ICD-10-CM

## 2021-11-22 DIAGNOSIS — K5150 Left sided colitis without complications: Secondary | ICD-10-CM | POA: Diagnosis not present

## 2021-11-22 DIAGNOSIS — K635 Polyp of colon: Secondary | ICD-10-CM

## 2021-11-22 DIAGNOSIS — Z86010 Personal history of colonic polyps: Secondary | ICD-10-CM

## 2021-11-22 DIAGNOSIS — D123 Benign neoplasm of transverse colon: Secondary | ICD-10-CM

## 2021-11-22 DIAGNOSIS — K5900 Constipation, unspecified: Secondary | ICD-10-CM | POA: Diagnosis not present

## 2021-11-22 DIAGNOSIS — I480 Paroxysmal atrial fibrillation: Secondary | ICD-10-CM | POA: Diagnosis not present

## 2021-11-22 DIAGNOSIS — H35033 Hypertensive retinopathy, bilateral: Secondary | ICD-10-CM

## 2021-11-22 DIAGNOSIS — K633 Ulcer of intestine: Secondary | ICD-10-CM

## 2021-11-22 DIAGNOSIS — D122 Benign neoplasm of ascending colon: Secondary | ICD-10-CM

## 2021-11-22 DIAGNOSIS — I10 Essential (primary) hypertension: Secondary | ICD-10-CM

## 2021-11-22 DIAGNOSIS — E113413 Type 2 diabetes mellitus with severe nonproliferative diabetic retinopathy with macular edema, bilateral: Secondary | ICD-10-CM

## 2021-11-22 DIAGNOSIS — R112 Nausea with vomiting, unspecified: Secondary | ICD-10-CM

## 2021-11-22 DIAGNOSIS — H25813 Combined forms of age-related cataract, bilateral: Secondary | ICD-10-CM

## 2021-11-22 DIAGNOSIS — K625 Hemorrhage of anus and rectum: Secondary | ICD-10-CM | POA: Diagnosis not present

## 2021-11-22 DIAGNOSIS — K5909 Other constipation: Secondary | ICD-10-CM | POA: Diagnosis not present

## 2021-11-22 HISTORY — PX: COLONOSCOPY WITH PROPOFOL: SHX5780

## 2021-11-22 HISTORY — PX: BIOPSY: SHX5522

## 2021-11-22 HISTORY — PX: ESOPHAGOGASTRODUODENOSCOPY (EGD) WITH PROPOFOL: SHX5813

## 2021-11-22 HISTORY — PX: POLYPECTOMY: SHX5525

## 2021-11-22 LAB — CBC
HCT: 48.2 % (ref 39.0–52.0)
Hemoglobin: 16 g/dL (ref 13.0–17.0)
MCH: 29.3 pg (ref 26.0–34.0)
MCHC: 33.2 g/dL (ref 30.0–36.0)
MCV: 88.3 fL (ref 80.0–100.0)
Platelets: 203 10*3/uL (ref 150–400)
RBC: 5.46 MIL/uL (ref 4.22–5.81)
RDW: 14.1 % (ref 11.5–15.5)
WBC: 10 10*3/uL (ref 4.0–10.5)
nRBC: 0 % (ref 0.0–0.2)

## 2021-11-22 LAB — BASIC METABOLIC PANEL
Anion gap: 10 (ref 5–15)
BUN: 15 mg/dL (ref 8–23)
CO2: 22 mmol/L (ref 22–32)
Calcium: 9 mg/dL (ref 8.9–10.3)
Chloride: 107 mmol/L (ref 98–111)
Creatinine, Ser: 1.72 mg/dL — ABNORMAL HIGH (ref 0.61–1.24)
GFR, Estimated: 45 mL/min — ABNORMAL LOW (ref >60)
Glucose, Bld: 138 mg/dL — ABNORMAL HIGH (ref 70–99)
Potassium: 3.8 mmol/L (ref 3.5–5.1)
Sodium: 139 mmol/L (ref 135–145)

## 2021-11-22 LAB — GLUCOSE, CAPILLARY
Glucose-Capillary: 124 mg/dL — ABNORMAL HIGH (ref 70–99)
Glucose-Capillary: 137 mg/dL — ABNORMAL HIGH (ref 70–99)
Glucose-Capillary: 178 mg/dL — ABNORMAL HIGH (ref 70–99)
Glucose-Capillary: 180 mg/dL — ABNORMAL HIGH (ref 70–99)
Glucose-Capillary: 203 mg/dL — ABNORMAL HIGH (ref 70–99)
Glucose-Capillary: 209 mg/dL — ABNORMAL HIGH (ref 70–99)

## 2021-11-22 LAB — MAGNESIUM: Magnesium: 2.5 mg/dL — ABNORMAL HIGH (ref 1.7–2.4)

## 2021-11-22 SURGERY — COLONOSCOPY WITH PROPOFOL
Anesthesia: Monitor Anesthesia Care

## 2021-11-22 MED ORDER — PROPOFOL 1000 MG/100ML IV EMUL
INTRAVENOUS | Status: AC
  Filled 2021-11-22: qty 100

## 2021-11-22 MED ORDER — EZETIMIBE 10 MG PO TABS
10.0000 mg | ORAL_TABLET | Freq: Every morning | ORAL | Status: DC
  Administered 2021-11-22 – 2021-11-23 (×2): 10 mg via ORAL
  Filled 2021-11-22 (×2): qty 1

## 2021-11-22 MED ORDER — APIXABAN 5 MG PO TABS
5.0000 mg | ORAL_TABLET | Freq: Two times a day (BID) | ORAL | Status: DC
  Administered 2021-11-22 – 2021-11-23 (×2): 5 mg via ORAL
  Filled 2021-11-22 (×2): qty 1

## 2021-11-22 MED ORDER — PROPOFOL 10 MG/ML IV BOLUS
INTRAVENOUS | Status: DC | PRN
  Administered 2021-11-22: 25 mg via INTRAVENOUS

## 2021-11-22 MED ORDER — SODIUM CHLORIDE 0.9 % IV SOLN: INTRAVENOUS | Status: DC

## 2021-11-22 MED ORDER — LACTATED RINGERS IV SOLN: INTRAVENOUS | Status: DC | PRN

## 2021-11-22 MED ORDER — PROPOFOL 500 MG/50ML IV EMUL
INTRAVENOUS | Status: DC | PRN
  Administered 2021-11-22: 125 ug/kg/min via INTRAVENOUS

## 2021-11-22 MED ORDER — PHENYLEPHRINE 80 MCG/ML (10ML) SYRINGE FOR IV PUSH (FOR BLOOD PRESSURE SUPPORT)
PREFILLED_SYRINGE | INTRAVENOUS | Status: DC | PRN
  Administered 2021-11-22 (×2): 120 ug via INTRAVENOUS

## 2021-11-22 SURGICAL SUPPLY — 25 items

## 2021-11-22 NOTE — Progress Notes (Signed)
  Transition of Care [facility]) Screening Note   Patient Details  Name: Calvin [name] Date of Birth: April 08, 1961   Transition of Care [facility]) CM/SW Contact:    Lennart Pall, LCSW Phone Number: 11/22/2021, 3:52 PM    Transition of Care Department [facility]) has reviewed patient and no TOC needs have been identified at this time. We will continue to monitor patient advancement through interdisciplinary progression rounds. If new patient transition needs arise, please place a TOC consult.

## 2021-11-22 NOTE — Op Note (Addendum)
[facility] Patient Name: Calvin [name] Procedure Date: 11/22/2021 MRN: [id number] Attending MD: Docia Chuck. Henrene Pastor , MD Date of Birth: 1960-12-22 CSN: [id number] Age: 61 Admit Type: Inpatient Procedure:                Upper GI endoscopy Indications:              Anorexia, Nausea with vomiting. Feeling better                            since admission Providers:                Docia Chuck. Henrene Pastor, MD, Dulcy Fanny, Frazier Richards,                            Technician Referring MD:             [facility] Medicines:                Monitored Anesthesia Care Complications:            No immediate complications. Estimated Blood Loss:     Estimated blood loss: none. Procedure:                Pre-Anesthesia Assessment:                           - Prior to the procedure, a History and Physical                            was performed, and patient medications and                            allergies were reviewed. The patient's tolerance of                            previous anesthesia was also reviewed. The risks                            and benefits of the procedure and the sedation                            options and risks were discussed with the patient.                            All questions were answered, and informed consent                            was obtained. Prior Anticoagulants: The patient has                            taken Eliquis (apixaban), last dose was 4 days                            prior to procedure. ASA Grade Assessment: III - A  patient with severe systemic disease. After                            reviewing the risks and benefits, the patient was                            deemed in satisfactory condition to undergo the                            procedure.                           After obtaining informed consent, the endoscope was                            passed under direct vision. Throughout the                             procedure, the patient's blood pressure, pulse, and                            oxygen saturations were monitored continuously. The                            [id number]-[id number] ([id number]) Olympus endoscope was introduced                            through the mouth, and advanced to the second part                            of duodenum. The upper GI endoscopy was                            accomplished without difficulty. The patient                            tolerated the procedure well. Scope In: Scope Out: Findings:      The esophagus was normal.      The stomach was normal. Small hiatal hernia.      The examined duodenum was normal.      The cardia and gastric fundus were normal on retroflexion. Impression:               1. Normal EGD. Moderate Sedation:      none      none Recommendation:           1. Carb modified diet                           2. Continue other medications                           3. Okay to resume anticoagulation                           4. See full colonoscopy report  5. If tolerating diet and without any other issues                            may be considered for discharge tomorrow. He will                            need close GI follow-up with Dr. Carlean Purl in a week                            or so.                           Discussed with patient. Procedure reports provided.                            Will follow. Procedure Code(s):        --- Professional ---                           [phone], Esophagogastroduodenoscopy, flexible,                            transoral; diagnostic, including collection of                            specimen(s) by brushing or washing, when performed                            (separate procedure) Diagnosis Code(s):        --- Professional ---                           R63.0, Anorexia                           R11.2, Nausea with vomiting, unspecified CPT copyright 2019 American  Medical Association. All rights reserved. The codes documented in this report are preliminary and upon coder review may  be revised to meet current compliance requirements. Docia Chuck. Henrene Pastor, MD 11/22/2021 10:20:55 AM This report has been signed electronically. Number of Addenda: 0

## 2021-11-22 NOTE — Interval H&P Note (Signed)
History and Physical Interval Note:  11/22/2021 9:12 AM  Calvin [name]  has presented today for surgery, with the diagnosis of vomiting, constipation, rectal bleeding, history of colon polyps.  The various methods of treatment have been discussed with the patient and family. After consideration of risks, benefits and other options for treatment, the patient has consented to  Procedure(s): COLONOSCOPY WITH PROPOFOL (N/A) ESOPHAGOGASTRODUODENOSCOPY (EGD) WITH PROPOFOL (N/A) as a surgical intervention.  The patient's history has been reviewed, patient examined, no change in status, stable for surgery.  I have reviewed the patient's chart and labs.  Questions were answered to the patient's satisfaction.     Calvin [name]

## 2021-11-22 NOTE — Anesthesia Postprocedure Evaluation (Signed)
Anesthesia Post Note  Patient: Cataldo Cosgriff  Procedure(s) Performed: COLONOSCOPY WITH PROPOFOL ESOPHAGOGASTRODUODENOSCOPY (EGD) WITH PROPOFOL POLYPECTOMY BIOPSY     Patient location during evaluation: Endoscopy Anesthesia Type: MAC Level of consciousness: awake and alert Pain management: pain level controlled Vital Signs Assessment: post-procedure vital signs reviewed and stable Respiratory status: spontaneous breathing, nonlabored ventilation and respiratory function stable Cardiovascular status: blood pressure returned to baseline and stable Postop Assessment: no apparent nausea or vomiting Anesthetic complications: no   No notable events documented.  Last Vitals:  Vitals:   11/22/21 1030 11/22/21 1040  BP: 118/74 115/82  Pulse: 73 80  Resp: 13   Temp:    SpO2: 100% 100%    Last Pain:  Vitals:   11/22/21 1040  TempSrc:   PainSc: 0-No pain                 Lidia Collum

## 2021-11-22 NOTE — Op Note (Signed)
[facility] Patient Name: Calvin [name] Procedure Date: 11/22/2021 MRN: [id number] Attending MD: Docia Chuck. Henrene Pastor , MD Date of Birth: 1960-11-29 CSN: [id number] Age: 61 Admit Type: Outpatient Procedure:                Colonoscopy with cold snare polypectomy x 7, with                            biopsies Indications:              High risk colon cancer surveillance: Personal                            history of multiple (3 or more) adenomas. Multiple                            prior colonoscopies with multiple adenomatous colon                            polyps. Also, minor rectal bleeding. Last                            colonoscopy February 2019 Providers:                Docia Chuck. Henrene Pastor, MD, Dulcy Fanny, Frazier Richards,                            Technician Referring MD:             [facility] Medicines:                Monitored Anesthesia Care Complications:            No immediate complications. Estimated blood loss:                            None. Estimated Blood Loss:     Estimated blood loss: none. Procedure:                Pre-Anesthesia Assessment:                           - Prior to the procedure, a History and Physical                            was performed, and patient medications and                            allergies were reviewed. The patient's tolerance of                            previous anesthesia was also reviewed. The risks                            and benefits of the procedure and the sedation                            options and risks were discussed with  the patient.                            All questions were answered, and informed consent                            was obtained. Prior Anticoagulants: The patient has                            taken Eliquis (apixaban), last dose was 4 days                            prior to procedure. ASA Grade Assessment: III - A                            patient with severe systemic  disease. After                            reviewing the risks and benefits, the patient was                            deemed in satisfactory condition to undergo the                            procedure.                           After obtaining informed consent, the colonoscope                            was passed under direct vision. Throughout the                            procedure, the patient's blood pressure, pulse, and                            oxygen saturations were monitored continuously. The                            [id number] ([id number]) Olympus colonoscope was                            introduced through the anus and advanced to the the                            cecum, identified by appendiceal orifice and                            ileocecal valve. The ileocecal valve, appendiceal                            orifice, and rectum were photographed. The quality  of the bowel preparation was excellent. The                            colonoscopy was performed without difficulty. The                            patient tolerated the procedure well. The bowel                            preparation used was MoviPrep via split dose                            instruction. Scope In: 9:33:44 AM Scope Out: 9:54:19 AM Scope Withdrawal Time: 0 hours 12 minutes 59 seconds  Total Procedure Duration: 0 hours 20 minutes 35 seconds  Findings:      There was distal ulceration and erythema with edema involving the       rectum. There was colitis involving the sigmoid colon. There were areas       above which were normal, but focal areas of ulceration above in the       sigmoid and descending colon. See images. Biopsies were taken with a       cold forceps for histology.      Seven polyps were found in the transverse colon and ascending colon. The       polyps were 2 to 8 mm in size. These polyps were removed with a cold       snare. Resection and retrieval were  complete.      The exam was otherwise without abnormality on direct and retroflexion       views. Impression:               - Inflammation and ulceration was found in the                            rectum, sigmoid, and descending colon secondary to                            colitis. Etiology unclear. The colon was said to be                            normal on CT scan Nov 07, 2021. Biopsied.                           - Seven 2 to 8 mm polyps in the transverse colon                            and in the ascending colon, removed with a cold                            snare. Resected and retrieved.                           - The examination was otherwise normal on direct  and retroflexion views. Moderate Sedation:      none Recommendation:           - Repeat colonoscopy in 3 years for surveillance                            (multiple adenomatous colon polyps).                           - Resume Eliquis (apixaban) today at prior dose.                           - Patient has a contact number available for                            emergencies. The signs and symptoms of potential                            delayed complications were discussed with the                            patient. Return to normal activities tomorrow.                            Written discharge instructions were provided to the                            patient.                           - Resume previous diet.                           - Continue present medications.                           - Await pathology results.                           - EGD today. Please see report regarding findings                            and final recommendations Procedure Code(s):        --- Professional ---                           [phone], Colonoscopy, flexible; with removal of                            tumor(s), polyp(s), or other lesion(s) by snare                            technique                            [id number], 59, Colonoscopy, flexible; with biopsy,  single or multiple Diagnosis Code(s):        --- Professional ---                           Z86.010, Personal history of colonic polyps                           K52.9, Noninfective gastroenteritis and colitis,                            unspecified                           K63.5, Polyp of colon CPT copyright 2019 American Medical Association. All rights reserved. The codes documented in this report are preliminary and upon coder review may  be revised to meet current compliance requirements. Docia Chuck. Henrene Pastor, MD 11/22/2021 10:13:48 AM This report has been signed electronically. Number of Addenda: 0

## 2021-11-22 NOTE — Progress Notes (Signed)
PROGRESS NOTE    Calvin [name]  PPI:[id number] DOB: [birth year] DOA: 11/19/2021 PCP: Roselee Nova, MD   Brief Narrative:  61 year old with history of nonischemic cardiomyopathy EF 30%, paroxysmal A-fib on Eliquis, insulin-dependent DM 2, neurocognitive disorder, chronic abdominal pain and constipation came to the hospital from GI office due to complaints of nausea, vomiting and constipation.  Patient was given smog enema with some response but this morning started having significant nausea vomiting and rectal bleeding.  GI performed endoscopy which was overall unremarkable except small hiatal hernia, Colonoscopy showed inflammation and ulceration in rectum, sigmoid and descending colon suggestive of colitis.  There were multiple polyps which were removed.  Repeat colonoscopy in about 3 years, okay to resume Eliquis.  Advance diet as tolerated and hopefully discharge tomorrow.   Assessment & Plan:  Principal Problem:   Constipation Active Problems:   AF (paroxysmal atrial fibrillation)   Type II diabetes mellitus   Mild neurocognitive disorder due to Alzheimer's disease, possible   Nonischemic cardiomyopathy   Stage 3a chronic kidney disease (CKD) (HCC)   Rectal bleeding   Persistent vomiting    Abdominal pain with nausea vomiting Bright red blood per rectum - Hemoglobin is stable for now but we will closely monitor as he is on anticoagulation.  S/p SMOG enema.  GI team consulted and following.  Abdominal x-ray done-overall normal bowel pattern.  GI performed endoscopy which was overall unremarkable except small hiatal hernia, Colonoscopy showed inflammation and ulceration in rectum, sigmoid and descending colon suggestive of colitis.  There were multiple polyps which were removed.  Repeat colonoscopy in about 3 years, okay to resume Eliquis.  Advance diet as tolerated and hopefully discharge tomorrow.  Leukocytosis - Likely reactive  Chronic congestive heart failure with  reduced ejection fraction, 30% - On statin and Coreg.  -Aldactone on hold due to mild bump in creatinine - Patient was switched by CHF team from [facility] to losartan 25 mg daily in March 2023 due to low blood pressure and dizziness. -Eliquis will be held in anticipation for GI procedure and bright red blood per rectum.  Also on Farxiga  Diabetes mellitus type 2, insulin-dependent.  Uncontrolled due to hyperglycemia - Sliding scale and Accu-Cheks.  He is also on [place] for CHF  Hyperlipidemia - Statin  CKD stage IIIa - Creatinine around baseline.  Continue to monitor.  Due to rising creatinine we will hold off on Aldactone and losartan today    DVT prophylaxis: Eliquis  Code Status: Full code Family Communication:   S/p EGD and Cscope today. Ok to [employer] tomorrow if stable per GI   Subjective: Tolerated EGD and C scope today. No other complaints  Examination: Constitutional: Not in acute distress Respiratory: Clear to auscultation bilaterally Cardiovascular: Normal sinus rhythm, no rubs Abdomen: Nontender nondistended good bowel sounds Musculoskeletal: No edema noted Skin: No rashes seen Neurologic: CN 2-12 grossly intact.  And nonfocal Psychiatric: Normal judgment and insight. Alert and oriented x 3. Normal mood.     Objective: Vitals:   11/21/21 1252 11/21/21 2048 11/22/21 0500 11/22/21 0610  BP: 111/71 125/78  127/67  Pulse: 87 93  87  Resp: 15 16  12   Temp: (!) 97.4 F (36.3 C) 97.8 F (36.6 C)  98.1 F (36.7 C)  TempSrc: Oral Oral  Oral  SpO2: 99% 100%  98%  Weight:   81.8 kg   Height:        Intake/Output Summary (Last 24 hours) at 11/22/2021 0730 Last data filed  at 11/22/2021 0600 Gross per 24 hour  Intake 2160 ml  Output 0 ml  Net 2160 ml   Filed Weights   11/20/21 0609 11/21/21 0500 11/22/21 0500  Weight: 82.5 kg 84.1 kg 81.8 kg     Data Reviewed:   CBC: Recent Labs  Lab 11/19/21 1248 11/20/21 0445 11/21/21 0342 11/22/21 0458  WBC 5.9 10.7*  14.0* 10.0  NEUTROABS 3.2  --   --   --   HGB 16.0 15.9 14.6 16.0  HCT 47.6 48.4 43.7 48.2  MCV 87.0 86.6 87.1 88.3  PLT 209 205 204 431   Basic Metabolic Panel: Recent Labs  Lab 11/19/21 1248 11/20/21 0445 11/21/21 0342 11/22/21 0458  NA 135 139 138 139  K 4.5 4.6 4.0 3.8  CL 103 105 106 107  CO2 24 25 24 22   GLUCOSE 303* 319* 181* 138*  BUN 15 15 14 15   CREATININE 1.48* 1.34* 1.67* 1.72*  CALCIUM 9.5 9.4 9.0 9.0  MG  --   --  2.1 2.5*   GFR: Estimated Creatinine Clearance: 44.8 mL/min (A) (by C-G formula based on SCr of 1.72 mg/dL (H)). Liver Function Tests: Recent Labs  Lab 11/19/21 1248  AST 16  ALT 17  ALKPHOS 71  BILITOT 0.8  PROT 7.5  ALBUMIN 4.0   Recent Labs  Lab 11/19/21 1248  LIPASE 36   No results for input(s): AMMONIA in the last 168 hours. Coagulation Profile: No results for input(s): INR, PROTIME in the last 168 hours. Cardiac Enzymes: No results for input(s): CKTOTAL, CKMB, CKMBINDEX, TROPONINI in the last 168 hours. BNP (last 3 results) No results for input(s): PROBNP in the last 8760 hours. HbA1C: No results for input(s): HGBA1C in the last 72 hours. CBG: Recent Labs  Lab 11/21/21 1139 11/21/21 1531 11/21/21 1954 11/21/21 2333 11/22/21 0403  GLUCAP 210* 210* 152* 165* 137*   Lipid Profile: No results for input(s): CHOL, HDL, LDLCALC, TRIG, CHOLHDL, LDLDIRECT in the last 72 hours. Thyroid Function Tests: No results for input(s): TSH, T4TOTAL, FREET4, T3FREE, THYROIDAB in the last 72 hours. Anemia Panel: No results for input(s): VITAMINB12, FOLATE, FERRITIN, TIBC, IRON, RETICCTPCT in the last 72 hours. Sepsis Labs: No results for input(s): PROCALCITON, LATICACIDVEN in the last 168 hours.  No results found for this or any previous visit (from the past 240 hour(s)).       Radiology Studies: DG Abd 2 Views  Result Date: 11/20/2021 CLINICAL DATA:  Constipation EXAM: ABDOMEN - 2 VIEW COMPARISON:  11/07/2021 FINDINGS: Bowel gas  pattern is nonspecific. Small amount of stool is seen in the colon. There is no fecal impaction in the rectosigmoid. No abnormal masses or calcifications are seen. IMPRESSION: Nonspecific bowel gas pattern. Small stool burden in the colon. There is no fecal impaction in the rectosigmoid. Electronically Signed   By: Elmer Picker M.D.   On: 11/20/2021 10:35        Scheduled Meds:  amitriptyline  25 mg Oral QHS   bisacodyl  10 mg Oral QODAY   carvedilol  3.125 mg Oral BID WC   dapagliflozin propanediol  10 mg Oral q morning   donepezil  10 mg Oral QHS   insulin aspart  0-9 Units Subcutaneous Q4H   metoCLOPramide (REGLAN) injection  10 mg Intravenous Q6H   pantoprazole  40 mg Oral Daily   polyethylene glycol  17 g Oral BID   rosuvastatin  40 mg Oral QHS   sodium chloride flush  3 mL Intravenous Q12H  Continuous Infusions:  sodium chloride       LOS: 2 days   Time spent= 35 mins    Atziry Baranski Arsenio Loader, MD [facility]  If 7PM-7AM, please contact night-coverage  11/22/2021, 7:30 AM

## 2021-11-22 NOTE — Anesthesia Procedure Notes (Signed)
Procedure Name: MAC Date/Time: 11/22/2021 9:20 AM Performed by: Claudia Desanctis, CRNA Pre-anesthesia Checklist: Patient identified, Emergency Drugs available, Suction available and Patient being monitored Patient Re-evaluated:Patient Re-evaluated prior to induction Oxygen Delivery Method: Simple face mask

## 2021-11-22 NOTE — Anesthesia Preprocedure Evaluation (Addendum)
Anesthesia Evaluation  Patient identified by MRN, date of birth, ID band Patient awake    Reviewed: Allergy & Precautions, NPO status , Patient's Chart, lab work & pertinent test results  History of Anesthesia Complications Negative for: history of anesthetic complications  Airway Mallampati: II  TM Distance: >3 FB Neck ROM: Full    Dental   Pulmonary sleep apnea and Continuous Positive Airway Pressure Ventilation ,    Pulmonary exam normal        Cardiovascular hypertension, Pt. on home beta blockers + CAD, + Past MI and +CHF  Normal cardiovascular exam+ dysrhythmias Atrial Fibrillation    Echo 02/08/21: EF 30-35%, global hypokinesis, g1dd, mildly reduced RVSF, valves unremarkable  Cath 04/07/21: mild nonobstructive CAD, EF 55-60%, normal hemodynamics   Neuro/Psych Dementia negative neurological ROS     GI/Hepatic Neg liver ROS, GERD  ,  Endo/Other  diabetes, Type 2, Insulin Dependent  Renal/GU CRFRenal disease (CKD3, Cr 1.72)  negative genitourinary   Musculoskeletal negative musculoskeletal ROS (+)   Abdominal   Peds  Hematology negative hematology ROS (+)   Anesthesia Other Findings   Reproductive/Obstetrics                             Anesthesia Physical Anesthesia Plan  ASA: 3  Anesthesia Plan: MAC   Post-op Pain Management: Minimal or no pain anticipated   Induction: Intravenous  PONV Risk Score and Plan: 1 and Propofol infusion, TIVA and Treatment may vary due to age or medical condition  Airway Management Planned: Natural Airway, Nasal Cannula and Simple Face Mask  Additional Equipment: None  Intra-op Plan:   Post-operative Plan:   Informed Consent: I have reviewed the patients History and Physical, chart, labs and discussed the procedure including the risks, benefits and alternatives for the proposed anesthesia with the patient or authorized representative who has  indicated his/her understanding and acceptance.       Plan Discussed with:   Anesthesia Plan Comments:         Anesthesia Quick Evaluation

## 2021-11-22 NOTE — Transfer of Care (Signed)
Immediate Anesthesia Transfer of Care Note  Patient: Calvin [name]  Procedure(s) Performed: COLONOSCOPY WITH PROPOFOL ESOPHAGOGASTRODUODENOSCOPY (EGD) WITH PROPOFOL POLYPECTOMY BIOPSY  Patient Location: PACU  Anesthesia Type:MAC  Level of Consciousness: drowsy  Airway & Oxygen Therapy: Patient Spontanous Breathing and Patient connected to face mask  Post-op Assessment: Report given to RN and Post -op Vital signs reviewed and stable  Post vital signs: Reviewed and stable  Last Vitals:  Vitals Value Taken Time  BP    Temp    Pulse    Resp    SpO2      Last Pain:  Vitals:   11/22/21 0821  TempSrc: Temporal  PainSc: 0-No pain      Patients Stated Pain Goal: 2 (69/62/95 2841)  Complications: No notable events documented.

## 2021-11-23 ENCOUNTER — Ambulatory Visit: Payer: Medicare Other | Admitting: Internal Medicine

## 2021-11-23 ENCOUNTER — Encounter (HOSPITAL_COMMUNITY): Payer: Self-pay | Admitting: Internal Medicine

## 2021-11-23 DIAGNOSIS — I480 Paroxysmal atrial fibrillation: Secondary | ICD-10-CM | POA: Diagnosis not present

## 2021-11-23 DIAGNOSIS — D122 Benign neoplasm of ascending colon: Secondary | ICD-10-CM | POA: Diagnosis not present

## 2021-11-23 DIAGNOSIS — K5900 Constipation, unspecified: Secondary | ICD-10-CM | POA: Diagnosis not present

## 2021-11-23 DIAGNOSIS — K559 Vascular disorder of intestine, unspecified: Secondary | ICD-10-CM

## 2021-11-23 DIAGNOSIS — D123 Benign neoplasm of transverse colon: Secondary | ICD-10-CM | POA: Diagnosis not present

## 2021-11-23 DIAGNOSIS — R112 Nausea with vomiting, unspecified: Secondary | ICD-10-CM

## 2021-11-23 DIAGNOSIS — K633 Ulcer of intestine: Secondary | ICD-10-CM | POA: Diagnosis not present

## 2021-11-23 DIAGNOSIS — K5909 Other constipation: Secondary | ICD-10-CM | POA: Diagnosis not present

## 2021-11-23 DIAGNOSIS — K5150 Left sided colitis without complications: Secondary | ICD-10-CM | POA: Diagnosis not present

## 2021-11-23 LAB — CBC
HCT: 43.3 % (ref 39.0–52.0)
Hemoglobin: 14.5 g/dL (ref 13.0–17.0)
MCH: 29.3 pg (ref 26.0–34.0)
MCHC: 33.5 g/dL (ref 30.0–36.0)
MCV: 87.5 fL (ref 80.0–100.0)
Platelets: 185 10*3/uL (ref 150–400)
RBC: 4.95 MIL/uL (ref 4.22–5.81)
RDW: 13.7 % (ref 11.5–15.5)
WBC: 7.9 10*3/uL (ref 4.0–10.5)
nRBC: 0 % (ref 0.0–0.2)

## 2021-11-23 LAB — SURGICAL PATHOLOGY

## 2021-11-23 LAB — GLUCOSE, CAPILLARY
Glucose-Capillary: 225 mg/dL — ABNORMAL HIGH (ref 70–99)
Glucose-Capillary: 277 mg/dL — ABNORMAL HIGH (ref 70–99)

## 2021-11-23 LAB — BASIC METABOLIC PANEL
Anion gap: 8 (ref 5–15)
BUN: 18 mg/dL (ref 8–23)
CO2: 22 mmol/L (ref 22–32)
Calcium: 8.5 mg/dL — ABNORMAL LOW (ref 8.9–10.3)
Chloride: 106 mmol/L (ref 98–111)
Creatinine, Ser: 1.83 mg/dL — ABNORMAL HIGH (ref 0.61–1.24)
GFR, Estimated: 41 mL/min — ABNORMAL LOW (ref >60)
Glucose, Bld: 188 mg/dL — ABNORMAL HIGH (ref 70–99)
Potassium: 3.6 mmol/L (ref 3.5–5.1)
Sodium: 136 mmol/L (ref 135–145)

## 2021-11-23 LAB — MAGNESIUM: Magnesium: 2.3 mg/dL (ref 1.7–2.4)

## 2021-11-23 MED ORDER — POTASSIUM CHLORIDE 20 MEQ PO PACK
40.0000 meq | PACK | Freq: Once | ORAL | Status: AC
Start: 2021-11-23 — End: 2021-11-23
  Administered 2021-11-23: 40 meq via ORAL
  Filled 2021-11-23: qty 2

## 2021-11-23 NOTE — Progress Notes (Unsigned)
Patient ID: Calvin [name], male   DOB: 1961-06-22, 61 y.o.   MRN: [id number]  HPI: Calvin [name] is a 61 y.o.-year-old male, returning for follow-up for DM2, dx in 2009, insulin-dependent since diagnosis, uncontrolled, with complications (CAD, s/p AMI; A fib; Non ischemic cardiomyopathy; Systolic CHF; CKD; Peripheral neuropathy, PDR; ED; intermittent claudication; hepatic steatosis). Pt. previously saw Dr. Loanne Drilling, last visit 2 months ago.  He was recently in the ED with abdominal pain 11/07/2021.  He was just discharged after another presentation to the ED.  He has severe constipation.  He had colonoscopy in house.  Reviewed HbA1c: Lab Results  Component Value Date   HGBA1C 10.4 (A) 09/15/2021   HGBA1C 7.8 (A) 05/17/2021   HGBA1C 7.6 (H) 02/08/2021   HGBA1C 7.3 (A) 01/14/2021   HGBA1C 8.9 (A) 10/15/2020   HGBA1C 8.4 (A) 08/11/2020   HGBA1C 8.9 (A) 06/09/2020   HGBA1C 9.3 (A) 04/22/2020   HGBA1C 9.9 (A) 03/03/2020   HGBA1C 10.1 (A) 01/15/2020   Pt is on a regimen of: - Farxiga 10 mg in am - Tresiba 54 units daily  Pt checks his sugars *** a day and they are: - am: n/c - 2h after b'fast: n/c - before lunch: n/c - 2h after lunch: n/c - before dinner: n/c - 2h after dinner: n/c - bedtime: n/c - nighttime: n/c Lowest sugar was ***; he has hypoglycemia awareness at 70.  Highest sugar was ***.  Glucometer: One Touch Verio  Pt's meals are: - Breakfast: - Lunch: - Dinner: - Snacks:  - no CKD, last BUN/creatinine:  Lab Results  Component Value Date   BUN 18 11/23/2021   BUN 15 11/22/2021   CREATININE 1.83 (H) 11/23/2021   CREATININE 1.72 (H) 11/22/2021  Not on ACE inhibitor/ARB.  -+ HL; last set of lipids: Lab Results  Component Value Date   CHOL 129 05/25/2020   HDL 54 05/25/2020   LDLCALC 58 05/25/2020   TRIG 85 05/25/2020   CHOLHDL 2.4 05/25/2020  On Crestor 40 mg daily and Zetia 10 mg daily  - last eye exam was on 10/25/2021 (Dr. Coralyn Pear). + PDR. He has  cataract.  - no numbness and tingling in his feet.  On Neurontin 100 mg twice a day.  He has a history of Alzheimer's disease, OSA (on CPAP), GERD.  ROS: + see HPI No increased urination, blurry vision, nausea, chest pain.  PE: There were no vitals taken for this visit. Wt Readings from Last 3 Encounters:  11/23/21 185 lb 10 oz (84.2 kg)  11/19/21 186 lb 2 oz (84.4 kg)  10/28/21 196 lb (88.9 kg)   Constitutional: overweight, in NAD Eyes: PERRLA, EOMI, no exophthalmos ENT: moist mucous membranes, no thyromegaly, no cervical lymphadenopathy Cardiovascular: RRR, No MRG Respiratory: CTA B Musculoskeletal: no deformities, strength intact in all 4 Skin: moist, warm, no rashes Neurological: no tremor with outstretched hands, DTR normal in all 4  ASSESSMENT: 1. DM2, insulin-dependent, uncontrolled, with complications - CAD, s/p AMI - A fib - Non ischemic cardiomyopathy - Systolic CHF - Intermittent claudication - CKD - PDR - Peripheral neuropathy - ED - hepatic steatosis - severe hypoglycemia in 2021  2. HL  PLAN:  1. Patient with long-standing, uncontrolled diabetes, on oral antidiabetic regimen with SGLT2 inhibitor, and also on long-acting insulin, with still poor control.  Last HbA1c from 2 months ago was 10.4%, much higher than before, when it was 7.8%. - I suggested to:  There are no Patient Instructions on file for this  visit. - check sugars at different times of the day - check 1x a day, rotating checks - discussed about CBG targets for treatment: 80-130 mg/dL before meals and <180 mg/dL after meals; target HbA1c <7%. - given sugar log and advised how to fill it and to bring it at next appt  - given foot care handout  - given instructions for hypoglycemia management "15-15 rule"  - advised for yearly eye exams  - Return to clinic in 3 mo with sugar log   2. HL - Reviewed latest lipid panel 2021: LDL only slightly above the goal of less than 55 due to  cardiovascular disease Lab Results  Component Value Date   CHOL 129 05/25/2020   HDL 54 05/25/2020   LDLCALC 58 05/25/2020   TRIG 85 05/25/2020   CHOLHDL 2.4 05/25/2020  - Continues Crestor 40 mg daily and Zetia 10 mg daily without side effects.   Philemon Kingdom, MD PhD [facility] Endocrinology

## 2021-11-23 NOTE — Discharge Summary (Signed)
Physician Discharge Summary  Calvin [name] OAC:[id number] DOB: Dec 15, 1960 DOA: 11/19/2021  PCP: Roselee Nova, MD  Admit date: 11/19/2021 Discharge date: 11/23/2021  Admitted From: Home Disposition: Home  Recommendations for Outpatient Follow-up:  Follow up with PCP in 1-2 weeks Please obtain BMP/CBC in one week your next doctors visit.  Okay to resume home Eliquis Follow-up outpatient gastroenterology in 1-2 weeks Will need repeat colonoscopy in about 3 years, sooner if necessary.   Discharge Condition: Stable CODE STATUS: Full code Diet recommendation: Diabetic  Brief/Interim Summary: 61 year old with history of nonischemic cardiomyopathy EF 30%, paroxysmal A-fib on Eliquis, insulin-dependent DM 2, neurocognitive disorder, chronic abdominal pain and constipation came to the hospital from GI office due to complaints of nausea, vomiting and constipation.  Patient was given smog enema with some response but this morning started having significant nausea vomiting and rectal bleeding.  GI performed endoscopy which was overall unremarkable except small hiatal hernia, Colonoscopy showed inflammation and ulceration in rectum, sigmoid and descending colon suggestive of colitis.  There were multiple polyps which were removed.  Repeat colonoscopy in about 3 years, okay to resume Eliquis.  Patient is tolerating orals, will discharge him today.     Assessment & Plan:  Principal Problem:   Constipation Active Problems:   AF (paroxysmal atrial fibrillation)   Type II diabetes mellitus   Mild neurocognitive disorder due to Alzheimer's disease, possible   Nonischemic cardiomyopathy   Stage 3a chronic kidney disease (CKD) (HCC)   Rectal bleeding   Persistent vomiting     Abdominal pain with nausea vomiting Bright red blood per rectum - Hemoglobin is stable for now but we will closely monitor as he is on anticoagulation.  S/p SMOG enema.  GI team consulted and following.  Abdominal  x-ray done-overall normal bowel pattern.  GI performed endoscopy which was overall unremarkable except small hiatal hernia, Colonoscopy showed inflammation and ulceration in rectum, sigmoid and descending colon suggestive of colitis.  There were multiple polyps which were removed.  Repeat colonoscopy in about 3 years, okay to resume Eliquis.  Tolerating p.o., stable for discharge.  Recommendations as mentioned above.   Leukocytosis - Likely reactive, resolved   Chronic congestive heart failure with reduced ejection fraction, 30% - On statin and Coreg.  -Aldactone on hold due to mild bump in creatinine - In March 2023 patient was taken off Entresto and started on losartan due to dizziness and low blood pressures by outpatient CHF team. -Eliquis will be held in anticipation for GI procedure and bright red blood per rectum.  Also on Farxiga   Diabetes mellitus type 2, insulin-dependent.  Uncontrolled due to hyperglycemia - Resume home medicine   Hyperlipidemia - Statin   CKD stage IIIa - Resume home Aldactone and losartan     Discharge Diagnoses:  Principal Problem:   Constipation Active Problems:   AF (paroxysmal atrial fibrillation)   Type II diabetes mellitus   Mild neurocognitive disorder due to Alzheimer's disease, possible   History of colonic polyps   Nonischemic cardiomyopathy   Stage 3a chronic kidney disease (CKD) (HCC)   Rectal bleeding   Persistent vomiting   Benign neoplasm of ascending colon   Benign neoplasm of transverse colon   Left sided colitis without complications ([place])   Colonic ulcer   Nausea and vomiting      Consultations: Gastroenterology  Subjective: Patient feels great no complaints.  Tolerating orals without any issues.  Discharge Exam: Vitals:   11/23/21 0119 11/23/21 0453  BP: [name])  143/79 (!) 162/89  Pulse: 98 90  Resp: 16 16  Temp: 98 F (36.7 C) (!) 97.4 F (36.3 C)  SpO2: 97% 99%   Vitals:   11/22/21 2054 11/23/21 0119  11/23/21 0453 11/23/21 0500  BP: 128/78 (!) 143/79 (!) 162/89   Pulse: (!) 101 98 90   Resp: _0 Temp: 97.6 F (36.4 C) 98 F (36.7 C) (!) 97.4 F (36.3 C)   TempSrc: Oral Oral Oral   SpO2: 100% 97% 99%   Weight:    84.2 kg  Height:        General: Pt is alert, awake, not in acute distress Cardiovascular: RRR, S1/S2 +, no rubs, no gallops Respiratory: CTA bilaterally, no wheezing, no rhonchi Abdominal: Soft, NT, ND, bowel sounds + Extremities: no edema, no cyanosis  Discharge Instructions   Allergies as of 11/23/2021   No Known Allergies      Medication List     TAKE these medications    albuterol 108 (90 Base) MCG/ACT inhaler Commonly known as: VENTOLIN HFA Inhale 2 puffs into the lungs every 6 (six) hours as needed for wheezing or shortness of breath.   amitriptyline 25 MG tablet Commonly known as: ELAVIL Take 25 mg by mouth at bedtime.   bisacodyl 5 MG EC tablet Commonly known as: DULCOLAX Take 2 tablets (10 mg total) by mouth every other day. Take at bedtime   carvedilol 3.125 MG tablet Commonly known as: COREG Take 1 tablet (3.125 mg total) by mouth 2 (two) times daily with a meal.   donepezil 10 MG tablet Commonly known as: ARICEPT Take 1 tablet (10 mg total) by mouth at bedtime.   Eliquis 5 MG Tabs tablet Generic drug: apixaban TAKE ONE TABLET BY MOUTH EVERY MORNING and TAKE ONE TABLET BY MOUTH EVERYDAY AT BEDTIME What changed: See the new instructions.   ezetimibe 10 MG tablet Commonly known as: ZETIA TAKE ONE TABLET BY MOUTH EVERY MORNING   Farxiga 10 MG Tabs tablet Generic drug: dapagliflozin propanediol TAKE ONE TABLET BY MOUTH EVERY MORNING   gabapentin 100 MG capsule Commonly known as: NEURONTIN TAKE ONE CAPSULE BY MOUTH EVERY MORNING and TAKE ONE CAPSULE BY MOUTH AT NOON and TAKE ONE CAPSULE BY MOUTH EVERYDAY AT BEDTIME What changed: See the new instructions.   INSULIN SYRINGE .3CC/29GX1/2" 29G X 1/2" 0.3 ML Misc Inject 1  Syringe 3 (three) times daily as directed. Check blood sugar three times daily. Dx: E11.9   losartan 25 MG tablet Commonly known as: COZAAR Take 1 tablet (25 mg total) by mouth daily.   nitroGLYCERIN 0.4 MG SL tablet Commonly known as: NITROSTAT Place 1 tablet (0.4 mg total) under the tongue every 5 (five) minutes as needed for chest pain.   ondansetron 4 MG disintegrating tablet Commonly known as: ZOFRAN-ODT Take 1 tablet (4 mg total) by mouth every 8 (eight) hours as needed for nausea or vomiting.   OneTouch Delica Plus [id number] Misc USE TWICE DAILY What changed:  how much to take how to take this when to take this   [place] w/Device Kit 1 each by Other route daily.   OneTouch Verio test strip Generic drug: glucose blood 1 each by Other route 2 (two) times daily. And lancets 2/day   pantoprazole 40 MG tablet Commonly known as: PROTONIX TAKE ONE TABLET BY MOUTH ONCE DAILY What changed: how to take this   polyethylene glycol 17 g packet Commonly known as: MIRALAX / GLYCOLAX Take 17  g by mouth 2 (two) times daily.   Repatha 140 MG/ML Sosy Generic drug: Evolocumab INJECT 1 PEN INTO THE SKIN EVERY 14 DAYS What changed: See the new instructions.   rosuvastatin 40 MG tablet Commonly known as: CRESTOR Take 40 mg by mouth at bedtime.   sildenafil 100 MG tablet Commonly known as: Viagra Take 1 tablet (100 mg total) by mouth daily as needed for erectile dysfunction.   spironolactone 25 MG tablet Commonly known as: ALDACTONE Take 1 tablet (25 mg total) by mouth daily.   traMADol 50 MG tablet Commonly known as: ULTRAM Take 50 mg by mouth every 6 (six) hours as needed for moderate pain.   [name] FlexTouch 200 UNIT/ML FlexTouch Pen Generic drug: insulin degludec Inject 54 Units into the skin daily. And pen needles 1/day        Follow-up Information     [name], MD Follow up in 1 week(s).   Specialty: Gastroenterology Contact  information: [address] [place] [place] [address] [phone]         Shah, Syed Asad A, MD Follow up in 1 week(s).   Specialty: Family Medicine Contact information: [place] [address] [phone]         [name], MD .   Specialty: Cardiology Contact information: [place] [address] [place] [address] [phone]         [name], [name], MD .   Specialty: Cardiology Contact information: [place] [address] [phone]                No Known Allergies  You were cared for by a hospitalist during your hospital stay. If you have any questions about your discharge medications or the care you received while you were in the hospital after you are discharged, you can call the unit and asked to speak with the hospitalist on call if the hospitalist that took care of you is not available. Once you are discharged, your primary care physician will handle any further medical issues. Please note that no refills for any discharge medications will be authorized once you are discharged, as it is imperative that you return to your primary care physician (or establish a relationship with a primary care physician if you do not have one) for your aftercare needs so that they can reassess your need for medications and monitor your lab values.   Procedures/Studies: CT ABDOMEN PELVIS W CONTRAST  Result Date: 11/07/2021 CLINICAL DATA:  Right lower quadrant pain. EXAM: CT ABDOMEN AND PELVIS WITH CONTRAST TECHNIQUE: Multidetector CT imaging of the abdomen and pelvis was performed using the standard protocol following bolus administration of intravenous contrast. RADIATION DOSE REDUCTION: This exam was performed according to the departmental dose-optimization program which includes automated exposure control, adjustment of the mA and/or kV according to patient size and/or use of iterative reconstruction  technique. CONTRAST:  49m OMNIPAQUE IOHEXOL 300 MG/ML  SOLN COMPARISON:  September 05, 2021 FINDINGS: Lower chest: No acute abnormality. Hepatobiliary: There is diffuse fatty infiltration of the liver parenchyma. No focal liver abnormality is seen. No gallstones, gallbladder wall thickening, or biliary dilatation. Pancreas: Unremarkable. No pancreatic ductal dilatation or surrounding inflammatory changes. Spleen: Normal in size without focal abnormality. Adrenals/Urinary Tract: Adrenal glands are unremarkable. Kidneys are normal in size, without renal calculi or hydronephrosis. A stable 1.5 cm x 1.0 cm x 1.4 cm simple cyst is seen within the upper pole of the right kidney (  axial CT image 30, CT series 3/coronal reformatted image 70, CT series 6). No additional follow-up or imaging is recommended. Bladder is unremarkable. Stomach/Bowel: Stomach is within normal limits. Appendix appears normal. No evidence of bowel wall thickening, distention, or inflammatory changes. Vascular/Lymphatic: Aortic atherosclerosis. No enlarged abdominal or pelvic lymph nodes. Reproductive: There is mild to moderate severity prostate gland enlargement. Other: No abdominal wall hernia or abnormality. No abdominopelvic ascites. Musculoskeletal: No acute or significant osseous findings. IMPRESSION: 1. Hepatic steatosis. 2. Prostatomegaly. 3. Aortic atherosclerosis. Aortic Atherosclerosis (ICD10-I70.0). Electronically Signed   By: [name] M.D.   On: 11/07/2021 22:16   DG Abd 2 Views  Result Date: 11/20/2021 CLINICAL DATA:  Constipation EXAM: ABDOMEN - 2 VIEW COMPARISON:  11/07/2021 FINDINGS: Bowel gas pattern is nonspecific. Small amount of stool is seen in the colon. There is no fecal impaction in the rectosigmoid. No abnormal masses or calcifications are seen. IMPRESSION: Nonspecific bowel gas pattern. Small stool burden in the colon. There is no fecal impaction in the rectosigmoid. Electronically Signed   By: Elmer Picker  M.D.   On: 11/20/2021 10:35   DG Abd 2 Views  Result Date: 11/07/2021 CLINICAL DATA:  Constipation, generalized abdominal pain EXAM: ABDOMEN - 2 VIEW COMPARISON:  06/21/2019 FINDINGS: The bowel gas pattern is normal. There is no evidence of free air. No radio-opaque calculi or other significant radiographic abnormality is seen. No excessive stool burden. IMPRESSION: Negative. Electronically Signed   By: Rolm Baptise M.D.   On: 11/07/2021 19:08   Intravitreal Injection, Pharmacologic Agent - OD - Right Eye  Result Date: 10/25/2021 Time Out 10/25/2021. 10:23 AM. Confirmed correct patient, procedure, site, and patient consented. Anesthesia Topical anesthesia was used. Anesthetic medications included Lidocaine 2%, Proparacaine 0.5%. Procedure Preparation included 5% betadine to ocular surface, eyelid speculum. A (32g) needle was used. Injection: 1.25 mg Bevacizumab 1.59m/0.05ml   Route: Intravitreal, Site: Right Eye   NDC: 50242-060-01, Lot:: [id number] Expiration date: 12/27/2021 Post-op Post injection exam found visual acuity of at least counting fingers. The patient tolerated the procedure well. There were no complications. The patient received written and verbal post procedure care education. Post injection medications were not given.   Intravitreal Injection, Pharmacologic Agent - OS - Left Eye  Result Date: 10/25/2021 Time Out 10/25/2021. 10:23 AM. Confirmed correct patient, procedure, site, and patient consented. Anesthesia Topical anesthesia was used. Anesthetic medications included Lidocaine 2%, Proparacaine 0.5%. Procedure Preparation included 5% betadine to ocular surface, eyelid speculum. A (32g) needle was used. Injection: 1.25 mg Bevacizumab 1.241m0.05ml   Route: Intravitreal, Site: Left Eye   NDC: [id number]: : [id number] date: 12/30/2021 Post-op Post injection exam found visual acuity of at least counting fingers. The patient tolerated the procedure well. There were no complications.  The patient received written and verbal post procedure care education. Post injection medications were not given.   OCT, Retina - OU - Both Eyes  Result Date: 10/25/2021 Right Eye Quality was good. Central Foveal Thickness: 513. Progression has worsened. Findings include intraretinal fluid, intraretinal hyper-reflective material, subretinal hyper-reflective material, vitreomacular adhesion , subretinal fluid, abnormal foveal contour (Mild interval increase in central edema with increased IRF and +SRF). Left Eye Quality was good. Central Foveal Thickness: 455. Progression has improved. Findings include abnormal foveal contour, intraretinal fluid, intraretinal hyper-reflective material, subretinal fluid (Mild interval improvement in IRF and SRF). Notes *Images captured and stored on drive Diagnosis / Impression: Central DME OU OD: Mild interval increase in central edema with increased IRF and +SRF OS:  Mild interval improvement in IRF and SRF Clinical management: See below Abbreviations: NFP - Normal foveal profile. CME - cystoid macular edema. PED - pigment epithelial detachment. IRF - intraretinal fluid. SRF - subretinal fluid. EZ - ellipsoid zone. ERM - epiretinal membrane. ORA - outer retinal atrophy. ORT - outer retinal tubulation. SRHM - subretinal hyper-reflective material. IRHM - intraretinal hyper-reflective material     The results of significant diagnostics from this hospitalization (including imaging, microbiology, ancillary and laboratory) are listed below for reference.     Microbiology: No results found for this or any previous visit (from the past 240 hour(s)).   Labs: BNP (last 3 results) Recent Labs    05/12/21 1527 05/23/21 1031 09/30/21 1242  BNP 39.7 19.1 22.0   Basic Metabolic Panel: Recent Labs  Lab 11/19/21 1248 11/20/21 0445 11/21/21 0342 11/22/21 0458 11/23/21 0357  NA 135 139 138 139 136  K 4.5 4.6 4.0 3.8 3.6  CL 103 105 106 107 106  CO2 _0 GLUCOSE 303* 319* 181* 138* 188*  BUN _1 CREATININE 1.48* 1.34* 1.67* 1.72* 1.83*  CALCIUM 9.5 9.4 9.0 9.0 8.5*  MG  --   --  2.1 2.5* 2.3   Liver Function Tests: Recent Labs  Lab 11/19/21 1248  AST 16  ALT 17  ALKPHOS 71  BILITOT 0.8  PROT 7.5  ALBUMIN 4.0   Recent Labs  Lab 11/19/21 1248  LIPASE 36   No results for input(s): AMMONIA in the last 168 hours. CBC: Recent Labs  Lab 11/19/21 1248 11/20/21 0445 11/21/21 0342 11/22/21 0458 11/23/21 0357  WBC 5.9 10.7* 14.0* 10.0 7.9  NEUTROABS 3.2  --   --   --   --   HGB 16.0 15.9 14.6 16.0 14.5  HCT 47.6 48.4 43.7 48.2 43.3  MCV 87.0 86.6 87.1 88.3 87.5  PLT 209 205 204 203 185   Cardiac Enzymes: No results for input(s): CKTOTAL, CKMB, CKMBINDEX, TROPONINI in the last 168 hours. BNP: Invalid input(s): POCBNP CBG: Recent Labs  Lab 11/22/21 1610 11/22/21 1926 11/22/21 2351 11/23/21 0343 11/23/21 0716  GLUCAP 203* 178* 209* 225* 277*   D-Dimer No results for input(s): DDIMER in the last 72 hours. Hgb A1c No results for input(s): HGBA1C in the last 72 hours. Lipid Profile No results for input(s): CHOL, HDL, LDLCALC, TRIG, CHOLHDL, LDLDIRECT in the last 72 hours. Thyroid function studies No results for input(s): TSH, T4TOTAL, T3FREE, THYROIDAB in the last 72 hours.  Invalid input(s): FREET3 Anemia work up No results for input(s): VITAMINB12, FOLATE, FERRITIN, TIBC, IRON, RETICCTPCT in the last 72 hours. Urinalysis    Component Value Date/Time   COLORURINE STRAW (A) 09/05/2021 0927   APPEARANCEUR CLEAR 09/05/2021 0927   LABSPEC 1.024 09/05/2021 0927   PHURINE 6.0 09/05/2021 0927   GLUCOSEU >=500 (A) 09/05/2021 0927   HGBUR SMALL (A) 09/05/2021 0927   BILIRUBINUR NEGATIVE 09/05/2021 0927   KETONESUR NEGATIVE 09/05/2021 0927   PROTEINUR NEGATIVE 09/05/2021 0927   UROBILINOGEN 0.2 11/28/2012 1857   NITRITE NEGATIVE 09/05/2021 0927   LEUKOCYTESUR NEGATIVE 09/05/2021 0927   Sepsis  Labs Invalid input(s): PROCALCITONIN,  WBC,  LACTICIDVEN Microbiology No results found for this or any previous visit (from the past 240 hour(s)).   Time coordinating discharge:  I have spent 35 minutes face to face with the patient and on the ward discussing the patients care, assessment, plan and disposition with other care givers. >50% of the  time was devoted counseling the patient about the risks and benefits of treatment/Discharge disposition and coordinating care.   SIGNED:   Damita Lack, MD  [facility] 11/23/2021, 9:31 AM   If 7PM-7AM, please contact night-coverage

## 2021-11-23 NOTE — Progress Notes (Addendum)
Patient ID: Calvin [name], male   DOB: 1960-07-16, 61 y.o.   MRN: [id number]    Progress Note   Subjective   Day # 4  CC; chronic constipation, rectal bleeding  Eliquis on hold  Colonoscopy -distal ulceration and erythema involving the rectum, colitis involving the sigmoid colon, some focal areas of ulceration above in the sigmoid and descending colon, biopsies pending 7 polyps removed all 2 to 8 mm in size  EGD-normal  Labs-WBC 7.9/hemoglobin 14.5/hematocrit 43.3 stable BUN 18/creatinine 1.83  Patient says he feels good, has no complaints of abdominal pain, no bowel movement since procedure, no bleeding   Objective   Vital signs in last 24 hours: Temp:  [97.1 F (36.2 C)-98.4 F (36.9 C)] 97.4 F (36.3 C) (05/23 0453) Pulse Rate:  [73-101] 90 (05/23 0453) Resp:  [12-20] 16 (05/23 0453) BP: (89-162)/(52-91) 162/89 (05/23 0453) SpO2:  [97 %-100 %] 99 % (05/23 0453) Weight:  [84.2 kg] 84.2 kg (05/23 0500) Last BM Date : 11/22/21 General: Older African-American male in NAD Heart:  Regular rate and rhythm; no murmurs Lungs: Respirations even and unlabored, lungs CTA bilaterally Abdomen:  Soft, nontender and nondistended. Normal bowel sounds. Extremities:  Without edema. Neurologic:  Alert and oriented,  grossly normal neurologically. Psych:  Cooperative. Normal mood and affect.  Intake/Output from previous day: 05/22 0701 - 05/23 0700 In: 1450 [P.O.:1200; I.V.:250] Out: 0  Intake/Output this shift: No intake/output data recorded.  Lab Results: Recent Labs    11/21/21 0342 11/22/21 0458 11/23/21 0357  WBC 14.0* 10.0 7.9  HGB 14.6 16.0 14.5  HCT 43.7 48.2 43.3  PLT 204 203 185   BMET Recent Labs    11/21/21 0342 11/22/21 0458 11/23/21 0357  NA 138 139 136  K 4.0 3.8 3.6  CL 106 107 106  CO2 24 22 22   GLUCOSE 181* 138* 188*  BUN 14 15 18   CREATININE 1.67* 1.72* 1.83*  CALCIUM 9.0 9.0 8.5*   LFT No results for input(s): PROT, ALBUMIN, AST, ALT,  ALKPHOS, BILITOT, BILIDIR, IBILI in the last 72 hours. PT/INR No results for input(s): LABPROT, INR in the last 72 hours.  Studies/Results: No results found.     Assessment / Plan:    #54 60 year old male with history of chronic constipation, admitted with abdominal pain, nausea vomiting and worsening obstipation.  Obstipation associated with rectal bleeding Patient on chronic Eliquis at home  Hemoglobin has remained normal, no significant blood loss EGD and colonoscopy yesterday as outlined above with normal EGD, and colonoscopy with finding of ulcerations in the rectum, sigmoid colon and areas of the descending colon. He also had 7 polyps removed  Findings at colonoscopy explain the rectal bleeding-may have new onset ulcerative colitis Biopsies are pending  #2 chronic constipation-has been taking MiraLAX every other day or so at home, will increase to 17 g in 8 ounces of water daily, if no bowel movement then take twice daily dosing as needed.  #3 atrial fibrillation-okay to resume Eliquis at discharge #4 nonischemic cardiomyopathy-EF 30% #5 insulin-dependent diabetes #6.  Neurocognitive disorder  Will arrange for office follow-up with Dr. Carlean Purl- patient will be notified after biopsies reviewed.  Further recommendations regarding treatment of the proctitis and sigmoid colitis pending biopsies   Principal Problem:   Constipation Active Problems:   Type II diabetes mellitus   Mild neurocognitive disorder due to Alzheimer's disease, possible   History of colonic polyps   AF (paroxysmal atrial fibrillation)   Nonischemic cardiomyopathy   Stage 3a  chronic kidney disease (CKD) (HCC)   Rectal bleeding   Persistent vomiting   Benign neoplasm of ascending colon   Benign neoplasm of transverse colon   Left sided colitis without complications (HCC)   Colonic ulcer   Nausea and vomiting     LOS: 3 days   Amy Esterwood [name] 11/23/2021, 9:42 AM  GI ATTENDING  Interval  history data reviewed.  Patient seen and examined.  Much improved clinically.  Tolerating diet.  Ready for discharge.  BIOPSIES show changes consistent with ischemia.  I have relayed this information to Dr. Carlean Purl, with whom the patient has planned follow-up.  Docia Chuck. Geri [place]in[place]., M.D. [facility] Division of Gastroenterology

## 2021-11-23 NOTE — Plan of Care (Signed)
  Problem: Education: Goal: Knowledge of General Education information will improve Description: Including pain rating scale, medication(s)/side effects and non-pharmacologic comfort measures Outcome: Adequate for Discharge   Problem: Health Behavior/Discharge Planning: Goal: Ability to manage health-related needs will improve Outcome: Adequate for Discharge   Problem: Clinical Measurements: Goal: Ability to maintain clinical measurements within normal limits will improve Outcome: Adequate for Discharge Goal: Will remain free from infection Outcome: Adequate for Discharge Goal: Diagnostic test results will improve Outcome: Adequate for Discharge Goal: Respiratory complications will improve Outcome: Adequate for Discharge Goal: Cardiovascular complication will be avoided Outcome: Adequate for Discharge   Problem: Nutrition: Goal: Adequate nutrition will be maintained Outcome: Adequate for Discharge   Problem: Elimination: Goal: Will not experience complications related to bowel motility Outcome: Adequate for Discharge   Problem: Safety: Goal: Ability to remain free from injury will improve Outcome: Adequate for Discharge

## 2021-11-24 ENCOUNTER — Telehealth (HOSPITAL_COMMUNITY): Payer: Self-pay | Admitting: Licensed Clinical Social Worker

## 2021-11-24 ENCOUNTER — Telehealth: Payer: Self-pay

## 2021-11-24 NOTE — Telephone Encounter (Signed)
Called the patient to schedule his follow up from the recent hospitalization. No answer. Left the appointment information on his voicemail. Mailed an appointment card to the patient's address.

## 2021-11-24 NOTE — Telephone Encounter (Signed)
HF Paramedicine Team Based Care Meeting  HF MD- NA  HF NP - [place] NP-C   [facility] admit within the last 30 days for heart failure?   Medications concerns? resolved  Transportation issues ? None   Education needs? Continued reinforcement of liquid and sodium restrictions  SDOH -none  Eligible for discharge? Plan for DC after another visit  Jorge Ny, LCSW Clinical Social Worker [facility] Desk#: [phone] Cell#: [phone]

## 2021-11-30 ENCOUNTER — Telehealth: Payer: Self-pay | Admitting: Internal Medicine

## 2021-11-30 ENCOUNTER — Other Ambulatory Visit: Payer: Self-pay | Admitting: *Deleted

## 2021-11-30 NOTE — Telephone Encounter (Signed)
Patient called stating he has not had a BM in over a week and was told to call if he was still having issues.  Please call patient and advise.  Thank you.

## 2021-11-30 NOTE — Patient Outreach (Signed)
[facility]) Care Management  11/30/2021  Calvin [name] 1961-02-22 [id number]   RN Health Coach attempted follow up outreach call to patient.  Patient was unavailable. HIPPA compliance voicemail message left with return callback number.  RN Health Coach received return telephone call from patient.  Hipaa compliance verified. Per patient he is in a computer class. Patient stated that he has not had a bowel movement in 8 days since colonoscopy and was going to call his Dr after the class. RN stated to patient that his A1C has elevated from 7.8 to 10.4. Patient stated that he had been unable to take his medication that he was vomiting and diarrhea. He stated that now he has started taking it again. Patient had to go back to class.   Plan: Patient will follow up with gastroenterologist regarding constipation RN will follow up again within 30 days  [place] [facility] [phone]

## 2021-12-01 ENCOUNTER — Telehealth: Payer: Self-pay | Admitting: Internal Medicine

## 2021-12-01 NOTE — Telephone Encounter (Signed)
Spoke with pt and discussed with him he can do a miralax purge to help him have a BM. Pt verbalized understanding.

## 2021-12-01 NOTE — Telephone Encounter (Unsigned)
Left message for pt to call back  °

## 2021-12-02 ENCOUNTER — Telehealth: Payer: Self-pay

## 2021-12-02 NOTE — Telephone Encounter (Signed)
Left message for pt to call back  °

## 2021-12-02 NOTE — Telephone Encounter (Signed)
Pt stated that he was admitted to [facility] on Friday and discharged on Last Tuesday. ( 9 days ago) in which was his last BM.  Pt stated that he did the miralx purge several days ago and only had a very very small BM last night. Pt stated that he woke up this AM vomiting and cant keep anything down. Pt was notified to go to the ED for evaluation and treatment. Pt verbalized understanding with all questions answered.

## 2021-12-02 NOTE — Telephone Encounter (Signed)
Sent pt a text asking him if he wanted to retry PREP in June or July to contact me

## 2021-12-06 ENCOUNTER — Other Ambulatory Visit (HOSPITAL_COMMUNITY): Payer: Self-pay | Admitting: Internal Medicine

## 2021-12-13 ENCOUNTER — Other Ambulatory Visit: Payer: Self-pay | Admitting: Nurse Practitioner

## 2021-12-13 ENCOUNTER — Other Ambulatory Visit (HOSPITAL_COMMUNITY): Payer: Self-pay | Admitting: Cardiology

## 2021-12-13 DIAGNOSIS — M5441 Lumbago with sciatica, right side: Secondary | ICD-10-CM

## 2021-12-16 ENCOUNTER — Other Ambulatory Visit: Payer: Self-pay | Admitting: Nurse Practitioner

## 2021-12-16 ENCOUNTER — Other Ambulatory Visit (HOSPITAL_COMMUNITY): Payer: Self-pay

## 2021-12-16 DIAGNOSIS — M5441 Lumbago with sciatica, right side: Secondary | ICD-10-CM

## 2021-12-16 NOTE — Progress Notes (Signed)
Paramedicine Encounter    Patient ID: Calvin [name], male    DOB: 1961/02/02, 61 y.o.   MRN: [id number]  Pt had recent admission for constipation. When he left the hosp he felt better-after a week or so after that he began feel worse-he began vomiting that lasted about a week and then he was prescribed more medicine-from this dedicated senior group. He said that medical group came one [facility] at the [facility] and set up to recruit patients so he decided to join. He wasn't aware they are not [facility]-related providers.  He has been added on glimepiride 71m to take if CBG is over 200 in the morning.  He does not have a glucometer though. He used to have one but he isnt sure where its at.   Dr fChapman Fitchalso put him on antibiotics--[id number]  amox/k clav 500-125 --start on 6/2 for 7 day supply. But he still has some left. Dr fuChapman Fitchold him to take when he felt nauseated. He said he wasn't going to mess with those anymore.   he needs to get rx from PCP regarding meter.  [facility] to [facility] pill pack delivery. Also to confirm med list in pill packs.  His weight is down from when I seen him last.  From HF standpoint he is stable. His meds are straight. I advised him to be sure to get dedicated senior care help when there are mred changes since he is on pill packs.  He can now be d/c from paramedicine.   CBG EMS-336  BP (!) 144/88   Pulse 88   Resp 16   Wt 179 lb (81.2 kg)   SpO2 99%   BMI 28.89 kg/m  Weight yesterday-? Last visit weight-196  Patient Care Team: ShRoselee NovaMD as PCP - General (Family Medicine) McBurnell BlanksMD as PCP - Cardiology (Cardiology) [name], DaShaune PascalMD as PCP - Advanced Heart Failure (Cardiology) ElRenato ShinMD (Inactive) as Consulting Physician (Endocrinology) UrJorge NyLCSW as Social Worker (Licensed Clinical Social Worker) Pleasant, FrEppie GibsonRN as TrVanleeranagement WeShawn RouteSaNancy MarusNeurology)  Patient Active Problem List   Diagnosis Date Noted   [facility]   Benign neoplasm of ascending colon    Benign neoplasm of transverse colon    [facility]   Colonic ulcer    Nausea and vomiting    Rectal bleeding    Persistent vomiting    Constipation 11/19/2021   Stage 3a chronic kidney disease (CKD) ([place]/19/2023   ED (erectile dysfunction) 09/15/2021   Mild neurocognitive disorder due to Alzheimer's disease, possible 08/24/2021   Diabetic retinopathy 08/24/2021   Heart attack 08/24/2021   Nonischemic cardiomyopathy 08/24/2021   Coronary artery disease 08/24/2021   Adrenal nodule 09/21/2020   Intermittent claudication 05/25/2020   Hypoglycemia due to insulin 10/15/2019   Chronic anticoagulation 10/15/2019   Right sided weakness 06/17/2019   Foot pain, bilateral 02/01/2019   Unsteady gait 12/20/2018   AF (paroxysmal atrial fibrillation) 03/10/2018   Diabetic neuropathy 09/26/2017   OSA on CPAP 05/10/2017   Gastroesophageal reflux disease 05/10/2017   Insomnia 05/10/2017   Onychomycosis of multiple toenails with type 2 diabetes mellitus and peripheral neuropathy 1188/50/2774 Chronic systolic heart failure 0512/87/8676 Essential hypertension 11/22/2015   Type II diabetes mellitus 11/20/2015   HLD (hyperlipidemia) 11/20/2015   History of colonic polyps 05/19/2011  Current Outpatient Medications:    albuterol (VENTOLIN HFA) 108 (90 Base) MCG/ACT inhaler, Inhale 2 puffs into the lungs every 6 (six) hours as needed for wheezing or shortness of breath., Disp: 6.7 g, Rfl: 0   amitriptyline (ELAVIL) 25 MG tablet, Take 25 mg by mouth at bedtime., Disp: , Rfl:    bisacodyl (DULCOLAX) 5 MG EC tablet, Take 2 tablets (10 mg total) by mouth every other day. Take at bedtime, Disp: 30 tablet, Rfl: 0   Blood Glucose Monitoring Suppl ([place]) w/Device KIT, 1 each by Other route daily., Disp: , Rfl:     carvedilol (COREG) 3.125 MG tablet, Take 1 tablet (3.125 mg total) by mouth 2 (two) times daily with a meal., Disp: 60 tablet, Rfl: 10   donepezil (ARICEPT) 10 MG tablet, Take 1 tablet (10 mg total) by mouth at bedtime., Disp: 30 tablet, Rfl: 11   ELIQUIS 5 MG TABS tablet, TAKE ONE TABLET BY MOUTH EVERY MORNING and TAKE ONE TABLET BY MOUTH EVERYDAY AT BEDTIME (Patient taking differently: Take 5 mg by mouth 2 (two) times daily.), Disp: 60 tablet, Rfl: 3   ezetimibe (ZETIA) 10 MG tablet, TAKE ONE TABLET BY MOUTH EVERY MORNING, Disp: 30 tablet, Rfl: 11   FARXIGA 10 MG TABS tablet, TAKE ONE TABLET BY MOUTH EVERY MORNING, Disp: 90 tablet, Rfl: 3   gabapentin (NEURONTIN) 100 MG capsule, TAKE ONE CAPSULE BY MOUTH EVERY MORNING and TAKE ONE CAPSULE BY MOUTH AT NOON and TAKE ONE CAPSULE BY MOUTH EVERYDAY AT BEDTIME (Patient taking differently: 100 mg 3 (three) times daily.), Disp: 270 capsule, Rfl: 0   glimepiride (AMARYL) 2 MG tablet, Take 2 mg by mouth daily with breakfast., Disp: , Rfl:    glucose blood (ONETOUCH VERIO) test strip, 1 each by Other route 2 (two) times daily. And lancets 2/day, Disp: 200 each, Rfl: 3   insulin degludec (TRESIBA FLEXTOUCH) 200 UNIT/ML FlexTouch Pen, Inject 54 Units into the skin daily. And pen needles 1/day, Disp: 18 mL, Rfl: 3   Insulin Syringe-Needle U-100 (INSULIN SYRINGE .3CC/29GX1/2") 29G X 1/2" 0.3 ML MISC, Inject 1 Syringe 3 (three) times daily as directed. Check blood sugar three times daily. Dx: E11.9, Disp: 100 each, Rfl: 3   Lancets (ONETOUCH DELICA PLUS [id number]) MISC, USE TWICE DAILY (Patient taking differently: 1 each by Other route 2 (two) times daily.), Disp: 200 each, Rfl: 3   losartan (COZAAR) 25 MG tablet, Take 1 tablet (25 mg total) by mouth daily., Disp: 30 tablet, Rfl: 3   ondansetron (ZOFRAN-ODT) 4 MG disintegrating tablet, Take 1 tablet (4 mg total) by mouth every 8 (eight) hours as needed for nausea or vomiting., Disp: 8 tablet, Rfl: 0    pantoprazole (PROTONIX) 40 MG tablet, TAKE ONE TABLET BY MOUTH ONCE DAILY (Patient taking differently: 40 mg daily.), Disp: 90 tablet, Rfl: 3   polyethylene glycol (MIRALAX / GLYCOLAX) 17 g packet, Take 17 g by mouth 2 (two) times daily., Disp: 14 each, Rfl: 0   REPATHA 140 MG/ML SOSY, INJECT 1 PEN INTO THE SKIN EVERY 14 DAYS (Patient taking differently: Inject 149 mg as directed every 14 (fourteen) days.), Disp: 6 mL, Rfl: 3   rosuvastatin (CRESTOR) 40 MG tablet, Take 40 mg by mouth at bedtime., Disp: , Rfl:    spironolactone (ALDACTONE) 25 MG tablet, TAKE ONE TABLET BY MOUTH ONCE DAILY, Disp: 90 tablet, Rfl: 3   traMADol (ULTRAM) 50 MG tablet, Take 50 mg by mouth every 6 (six) hours as needed  for moderate pain., Disp: , Rfl:    nitroGLYCERIN (NITROSTAT) 0.4 MG SL tablet, Place 1 tablet (0.4 mg total) under the tongue every 5 (five) minutes as needed for chest pain. (Patient not taking: Reported on 12/16/2021), Disp: 30 tablet, Rfl: 1   sildenafil (VIAGRA) 100 MG tablet, Take 1 tablet (100 mg total) by mouth daily as needed for erectile dysfunction. (Patient not taking: Reported on 12/16/2021), Disp: 10 tablet, Rfl: 11 No Known Allergies    Social History   Socioeconomic History   Marital status: Married    Spouse name: Not on file   Number of children: 0   Years of education: 13   Highest education level: Some college, no degree  Occupational History   Occupation: Disability  Tobacco Use   Smoking status: Never   Smokeless tobacco: Never  Vaping Use   Vaping Use: Never used  Substance and Sexual Activity   Alcohol use: No   Drug use: No   Sexual activity: Yes    Birth control/protection: None  Other Topics Concern   Not on file  Social History Narrative   Social History   Diet?    Do you drink/eat things with caffeine? yes   Marital status?       single      Do you live in a house, apartment, assisted living, condo, trailer, etc.? yes   Is it one or more stories? One story    How many persons live in your home?   Do you have any pets in your home? (please list)    Highest level of education completed? graduate   Do you exercise?            no                          Type & how often?   Advanced Directives   Do you have a living will? no   Do you have a DNR form?                                  If not, do you want to discuss one? no   Do you have signed POA/HPOA for forms? no      Functional Status   Do you have difficulty bathing or dressing yourself? no   Do you have difficulty preparing food or eating? no   Do you have difficulty managing your medications? no   Do you have difficulty managing your finances? no   Do you have difficulty affording your medications? no   Social Determinants of Health   Financial Resource Strain: Not on file  Food Insecurity: No Food Insecurity (08/25/2021)   Hunger Vital Sign    Worried About Running Out of Food in the Last Year: Never true    Ran Out of Food in the Last Year: Never true  Transportation Needs: No Transportation Needs (02/15/2021)   PRAPARE - Hydrologist (Medical): No    Lack of Transportation (Non-Medical): No  Physical Activity: Not on file  Stress: Not on file  Social Connections: Not on file  Intimate Partner Violence: Not on file    Physical Exam      Future Appointments  Date Time Provider [place]  12/21/2021  3:15 PM Pleasant, Eppie Gibson, RN [facility] None  12/22/2021  3:00 PM Esterwood, Amy S, [name] [facility]  [facility]  12/23/2021  9:15 AM Spero Geralds, MD [facility]UL[facility] None  01/12/2022 11:10 AM [name], MD [facility]  02/07/2022 12:00 PM [name], [name], MD [facility] None  03/23/2022  9:00 AM Rondel Jumbo, [name] [facility] None       Marylouise Stacks, [place] [facility] [place] Paramedic  12/16/21

## 2021-12-17 ENCOUNTER — Other Ambulatory Visit (HOSPITAL_COMMUNITY): Payer: Self-pay | Admitting: Internal Medicine

## 2021-12-17 DIAGNOSIS — M5441 Lumbago with sciatica, right side: Secondary | ICD-10-CM

## 2021-12-20 ENCOUNTER — Other Ambulatory Visit (HOSPITAL_COMMUNITY): Payer: Self-pay | Admitting: Internal Medicine

## 2021-12-20 ENCOUNTER — Other Ambulatory Visit: Payer: Self-pay | Admitting: Family Medicine

## 2021-12-20 DIAGNOSIS — R112 Nausea with vomiting, unspecified: Secondary | ICD-10-CM

## 2021-12-20 DIAGNOSIS — R109 Unspecified abdominal pain: Secondary | ICD-10-CM

## 2021-12-20 DIAGNOSIS — M5441 Lumbago with sciatica, right side: Secondary | ICD-10-CM

## 2021-12-21 ENCOUNTER — Other Ambulatory Visit: Payer: Self-pay | Admitting: *Deleted

## 2021-12-21 NOTE — Patient Outreach (Signed)
[facility]) Care Management  12/21/2021  Calvin [name] 01/07/1961 [id number]   Case closure patient is not eligible for [facility] benefits. PCP not in network.   [place] Care Management [phone]

## 2021-12-22 ENCOUNTER — Encounter: Payer: Self-pay | Admitting: Physician Assistant

## 2021-12-22 ENCOUNTER — Ambulatory Visit (INDEPENDENT_AMBULATORY_CARE_PROVIDER_SITE_OTHER): Payer: Medicare Other | Admitting: Physician Assistant

## 2021-12-22 VITALS — BP 116/62 | HR 93 | Ht 65.5 in | Wt 183.2 lb

## 2021-12-22 DIAGNOSIS — K760 Fatty (change of) liver, not elsewhere classified: Secondary | ICD-10-CM | POA: Diagnosis not present

## 2021-12-22 DIAGNOSIS — K5909 Other constipation: Secondary | ICD-10-CM

## 2021-12-22 DIAGNOSIS — Z86010 Personal history of colonic polyps: Secondary | ICD-10-CM

## 2021-12-22 DIAGNOSIS — K219 Gastro-esophageal reflux disease without esophagitis: Secondary | ICD-10-CM

## 2021-12-22 MED ORDER — PANTOPRAZOLE SODIUM 40 MG PO TBEC: 40.0000 mg | DELAYED_RELEASE_TABLET | Freq: Every day | ORAL | 3 refills | Status: DC

## 2021-12-22 NOTE — Progress Notes (Signed)
Subjective:    Patient ID: Calvin [name], male    DOB: [birth year], 61 y.o.   MRN: [id number]  HPI  Calvin [name] is a pleasant 61 year old African-American male, established with Calvin [name].  He comes in today for post hospital follow-up.  He was last seen in the office on 11/19/2021 by Calvin [name] at that time with complaints of nausea vomiting, abdominal pain and obstipation He was sent to the emergency room that day for further work-up and was admitted. CT of the abdomen and pelvis showed diffuse fatty infiltration of the liver, no gallstones prostatomegaly and aortic atherosclerosis. He was given smog enemas for the obstipation.After admission had some complaints of rectal bleeding, was followed by GI and underwent colonoscopy on 11/22/2021 per Calvin [name] he was found to have ulceration and erythema in the rectum, and colitis involving the sigmoid colon as well as some focal areas of abnormality above the sigmoid.  He had 7 polyps removed all 2 to 8 mm in size. Patient was discharged home on twice daily MiraLAX.[name]  Path from the colonoscopy returned showing the polyps all to be tubular adenomas, left colon biopsies and rectal biopsies showed benign ulcers most consistent with ischemia, no features consistent with IBD, no dysplasia. EGD was also done and normal. Hemoglobin was 14.5 on discharge.  .  Patient has history of congestive heart failure, nonischemic cardiomyopathy, atrial fibrillation for which he is on Eliquis, coronary artery disease, sleep apnea, diabetes mellitus, and chronic kidney disease stage III.  He comes in today stating that he has been doing well since discharge from the hospital however on twice daily MiraLAX he is having loose stools chronically though usually just 1-2 bowel movements per day.  He says most of his stools are liquid.  He has not seen any further rectal bleeding, he has no complaints of abdominal pain.  He says his appetite has been decreased for a while  but he can eat without difficulty.   Review of Systems Pertinent positive and negative review of systems were noted in the above HPI section.  All other review of systems was otherwise negative.   Outpatient Encounter Medications as of 12/22/2021  Medication Sig   albuterol (VENTOLIN HFA) 108 (90 Base) MCG/ACT inhaler Inhale 2 puffs into the lungs every 6 (six) hours as needed for wheezing or shortness of breath.   amitriptyline (ELAVIL) 25 MG tablet Take 25 mg by mouth at bedtime.   bisacodyl (DULCOLAX) 5 MG EC tablet Take 2 tablets (10 mg total) by mouth every other day. Take at bedtime   Blood Glucose Monitoring Suppl ([place]) w/Device KIT 1 each by Other route daily.   carvedilol (COREG) 3.125 MG tablet Take 1 tablet (3.125 mg total) by mouth 2 (two) times daily with a meal.   donepezil (ARICEPT) 10 MG tablet Take 1 tablet (10 mg total) by mouth at bedtime.   ELIQUIS 5 MG TABS tablet TAKE ONE TABLET BY MOUTH EVERY MORNING and TAKE ONE TABLET BY MOUTH EVERYDAY AT BEDTIME (Patient taking differently: Take 5 mg by mouth 2 (two) times daily.)   ezetimibe (ZETIA) 10 MG tablet TAKE ONE TABLET BY MOUTH EVERY MORNING   FARXIGA 10 MG TABS tablet TAKE ONE TABLET BY MOUTH EVERY MORNING   gabapentin (NEURONTIN) 100 MG capsule TAKE ONE CAPSULE BY MOUTH EVERY MORNING and TAKE ONE CAPSULE BY MOUTH AT NOON and TAKE ONE CAPSULE BY MOUTH EVERYDAY AT BEDTIME (Patient taking differently: 100 mg 3 (three) times daily.)  glimepiride (AMARYL) 2 MG tablet Take 2 mg by mouth daily with breakfast.   glucose blood (ONETOUCH VERIO) test strip 1 each by Other route 2 (two) times daily. And lancets 2/day   insulin degludec (TRESIBA FLEXTOUCH) 200 UNIT/ML FlexTouch Pen Inject 54 Units into the skin daily. And pen needles 1/day   Insulin Syringe-Needle U-100 (INSULIN SYRINGE .3CC/29GX1/2") 29G X 1/2" 0.3 ML MISC Inject 1 Syringe 3 (three) times daily as directed. Check blood sugar three times daily. Dx:  E11.9   Lancets (ONETOUCH DELICA PLUS [id number]) MISC USE TWICE DAILY (Patient taking differently: 1 each by Other route 2 (two) times daily.)   losartan (COZAAR) 25 MG tablet Take 1 tablet (25 mg total) by mouth daily.   nitroGLYCERIN (NITROSTAT) 0.4 MG SL tablet Place 1 tablet (0.4 mg total) under the tongue every 5 (five) minutes as needed for chest pain.   ondansetron (ZOFRAN-ODT) 4 MG disintegrating tablet Take 1 tablet (4 mg total) by mouth every 8 (eight) hours as needed for nausea or vomiting.   polyethylene glycol (MIRALAX / GLYCOLAX) 17 g packet Take 17 g by mouth 2 (two) times daily.   REPATHA 140 MG/ML SOSY INJECT 1 PEN INTO THE SKIN EVERY 14 DAYS (Patient taking differently: Inject 149 mg as directed every 14 (fourteen) days.)   rosuvastatin (CRESTOR) 40 MG tablet Take 40 mg by mouth at bedtime.   sildenafil (VIAGRA) 100 MG tablet Take 1 tablet (100 mg total) by mouth daily as needed for erectile dysfunction.   spironolactone (ALDACTONE) 25 MG tablet TAKE ONE TABLET BY MOUTH ONCE DAILY   traMADol (ULTRAM) 50 MG tablet Take 50 mg by mouth every 6 (six) hours as needed for moderate pain.   [DISCONTINUED] pantoprazole (PROTONIX) 40 MG tablet TAKE ONE TABLET BY MOUTH ONCE DAILY (Patient taking differently: 40 mg daily.)   pantoprazole (PROTONIX) 40 MG tablet Take 1 tablet (40 mg total) by mouth daily before breakfast.   No facility-administered encounter medications on file as of 12/22/2021.   No Known Allergies Patient Active Problem List   Diagnosis Date Noted   Ischemic colitis ([place])    Benign neoplasm of ascending colon    Benign neoplasm of transverse colon    Left sided colitis without complications ([place])    Colonic ulcer    Nausea and vomiting    Rectal bleeding    Persistent vomiting    Constipation 11/19/2021   Stage 3a chronic kidney disease (CKD) ([place]) 11/19/2021   ED (erectile dysfunction) 09/15/2021   Mild neurocognitive disorder due to Alzheimer's disease, possible  08/24/2021   Diabetic retinopathy 08/24/2021   Heart attack 08/24/2021   Nonischemic cardiomyopathy 08/24/2021   Coronary artery disease 08/24/2021   Adrenal nodule 09/21/2020   Intermittent claudication 05/25/2020   Hypoglycemia due to insulin 10/15/2019   Chronic anticoagulation 10/15/2019   Right sided weakness 06/17/2019   Foot pain, bilateral 02/01/2019   Unsteady gait 12/20/2018   AF (paroxysmal atrial fibrillation) 03/10/2018   Diabetic neuropathy 09/26/2017   OSA on CPAP 05/10/2017   Gastroesophageal reflux disease 05/10/2017   Insomnia 05/10/2017   Onychomycosis of multiple toenails with type 2 diabetes mellitus and peripheral neuropathy 67/34/1937   Chronic systolic heart failure 90/24/0973   Essential hypertension 11/22/2015   Type II diabetes mellitus 11/20/2015   HLD (hyperlipidemia) 11/20/2015   History of colonic polyps 05/19/2011   Social History   Socioeconomic History   Marital status: Married    Spouse name: Not on file   Number of  children: 0   Years of education: 13   Highest education level: Some college, no degree  Occupational History   Occupation: Disability  Tobacco Use   Smoking status: Never   Smokeless tobacco: Never  Vaping Use   Vaping Use: Never used  Substance and Sexual Activity   Alcohol use: No   Drug use: No   Sexual activity: Yes    Birth control/protection: None  Other Topics Concern   Not on file  Social History Narrative   Social History   Diet?    Do you drink/eat things with caffeine? yes   Marital status?       single      Do you live in a house, apartment, assisted living, condo, trailer, etc.? yes   Is it one or more stories? One story   How many persons live in your home?   Do you have any pets in your home? (please list)    Highest level of education completed? graduate   Do you exercise?            no                          Type & how often?   Advanced Directives   Do you have a living will? no   Do you have  a DNR form?                                  If not, do you want to discuss one? no   Do you have signed POA/HPOA for forms? no      Functional Status   Do you have difficulty bathing or dressing yourself? no   Do you have difficulty preparing food or eating? no   Do you have difficulty managing your medications? no   Do you have difficulty managing your finances? no   Do you have difficulty affording your medications? no   Social Determinants of Health   Financial Resource Strain: Not on file  Food Insecurity: No Food Insecurity (08/25/2021)   Hunger Vital Sign    Worried About Running Out of Food in the Last Year: Never true    Ran Out of Food in the Last Year: Never true  Transportation Needs: Unknown (02/15/2021)   PRAPARE - Hydrologist (Medical): No    Lack of Transportation (Non-Medical): Not on file  Physical Activity: Not on file  Stress: Not on file  Social Connections: Not on file  Intimate Partner Violence: Not on file    Mr. Qualls's family history includes Diabetes in his father and mother; Heart attack (age of onset: 57) in his mother; Heart attack (age of onset: 41) in his sister; Heart attack (age of onset: 65) in his father; Heart disease in his father and mother; Hyperlipidemia in his father and mother; Hypertension in his father and mother.      Objective:    Vitals:   12/22/21 1451  BP: 116/62  Pulse: 93  SpO2: 99%    Physical Exam Well-developed well-nourished older AA male in no acute distress.  Height, Weight, 183 BMI 30 ambulates with a cane   HEENT; nontraumatic normocephalic, EOMI, PE R LA, sclera anicteric. Oropharynx;not examined Neck; supple, no JVD Cardiovascular; regular rate and rhythm with S1-S2, no murmur rub or gallop Pulmonary; Clear bilaterally Abdomen; soft, nontender, nondistended, no  palpable mass or hepatosplenomegaly, bowel sounds are active Rectal;not done Skin; benign exam, no jaundice rash or  appreciable lesions Extremities; no clubbing cyanosis or edema skin warm and dry Neuro/Psych; alert and oriented x4, grossly nonfocal mood and affect appropriate        Assessment & Plan:   #102 61 year old African-American male, with multiple comorbidities, here today for post hospital follow-up after recent admission with nausea vomiting abdominal pain and obstipation. CT imaging did not show any acute abnormalities Obstipation was managed with laxatives and smog enemas.  Patient did develop some low-grade rectal bleeding while in the hospital and ultimately underwent colonoscopy with removal of 7 polyps all 2 to 8 mm in size and was noted to have rectal ulceration and erythema and some areas of colitis involving the sigmoid colon  Biopsies were most consistent with ischemic colitis Polyps were all tubular adenomas  He was not anemic, hemoglobin 14.5 on discharge, Eliquis resumed on discharge  He has been on twice daily MiraLAX since discharge and says that all of his stools are very loose, but usually just having 1-2 bowel movements per day.  #2 GERD stable on Protonix 40 mg daily #3 multiple tubular adenomatous colon polyps-will be due for interval follow-up in 3 years #4 chronic kidney disease stage III #5.  Adult onset diabetes mellitus #6.  Sleep apnea #7.  Congestive heart failure/nonischemic cardiomyopathy #8.  Atrial fibrillation on Eliquis  #9 fatty liver  Plan; continue MiraLAX, I have asked him to try taking MiraLAX once daily 1 day alternating with twice daily dosing on alternate days.  If he has any days without bowel movements then go back to twice daily dosing of MiraLAX We discussed importance of keeping his bowels moving to prevent obstipation/impaction and subsequent ischemia  Refill Protonix 40 mg p.o. every morning We will place him in our system for recall colonoscopy in 3 years.  We discussed initial management of fatty liver with good control of diabetes, and  for him a modest weight loss of 5 to 7%.  He does not consume alcohol.  He has a follow-up appointment scheduled with Calvin [name] in mid July, if he is doing well he can cancel that appointment and we will plan to see him back as needed.  He knows to call here for problems.  Deicy Rusk Genia Harold [name] 12/22/2021   Cc: Roselee Nova, MD

## 2021-12-22 NOTE — Patient Instructions (Addendum)
If you are age 62 or younger, your body mass index should be between 19-25. Your Body mass index is 30.02 kg/m. If this is out of the aformentioned range listed, please consider follow up with your Primary Care Provider.  ________________________________________________________  The [facility] providers would like to encourage you to use [facility] to communicate with providers for non-urgent requests or questions.  Due to long hold times on the telephone, sending your provider a message by [facility] may be a faster and more efficient way to get a response.  Please allow 48 business hours for a response.  Please remember that this is for non-urgent requests.  _______________________________________________________  Refills of Pantoprazole have been sent to your pharmacy.   Start taking Miralax once daily every other day, alternating with twice daily  If you are doing well you can cancel your follow up appointment with Dr. Carlean Purl.  Thank you for entrusting me with your care and choosing [facility].  Amy Esterwood, [name]

## 2021-12-23 ENCOUNTER — Ambulatory Visit (INDEPENDENT_AMBULATORY_CARE_PROVIDER_SITE_OTHER): Payer: Medicare Other | Admitting: Internal Medicine

## 2021-12-23 ENCOUNTER — Encounter: Payer: Self-pay | Admitting: Internal Medicine

## 2021-12-23 DIAGNOSIS — K219 Gastro-esophageal reflux disease without esophagitis: Secondary | ICD-10-CM

## 2021-12-23 MED ORDER — PANTOPRAZOLE SODIUM 40 MG PO TBEC: 40.0000 mg | DELAYED_RELEASE_TABLET | Freq: Two times a day (BID) | ORAL | 5 refills | Status: DC

## 2021-12-23 NOTE — Patient Instructions (Addendum)
Please schedule follow up scheduled with myself in 2 months.  If my schedule is not open yet, we will contact you with a reminder closer to that time. Please call [phone] if you haven't heard from [place] a month before.   Before your next visit I would like you to have: Full set of PFTs  Increase pantoprazole to twice a day.  Start sleeping with a wedge pillow.  Don't eat too late at night.   What is GERD? Gastroesophageal reflux disease (GERD) is gastroesophageal reflux diseasewhich occurs when the lower esophageal sphincter (LES) opens spontaneously, for varying periods of time, or does not close properly and stomach contents rise up into the esophagus. GER is also called acid reflux or acid regurgitation, because digestive juices--called acids--rise up with the food. The esophagus is the tube that carries food from the mouth to the stomach. The LES is a ring of muscle at the bottom of the esophagus that acts like a valve between the esophagus and stomach.  When acid reflux occurs, food or fluid can be tasted in the back of the mouth. When refluxed stomach acid touches the lining of the esophagus it may cause a burning sensation in the chest or throat called heartburn or acid indigestion. Occasional reflux is common. Persistent reflux that occurs more than twice a week is considered GERD, and it can eventually lead to more serious health problems. People of all ages can have GERD. Studies have shown that GERD may worsen or contribute to asthma, chronic cough, and pulmonary fibrosis.   What are the symptoms of GERD? The main symptom of GERD in adults is frequent heartburn, also called acid indigestion--burning-type pain in the lower part of the mid-chest, behind the breast bone, and in the mid-abdomen.  Not all reflux is acidic in nature, and many patients don't have heart burn at all. Sometimes it feels like a cough (either dry or with mucus), choking sensation, asthma, shortness of breath,  waking up at night, frequent throat clearing, or trouble swallowing.    What causes GERD? The reason some people develop GERD is still unclear. However, research shows that in people with GERD, the LES relaxes while the rest of the esophagus is working. Anatomical abnormalities such as a hiatal hernia may also contribute to GERD. A hiatal hernia occurs when the upper part of the stomach and the LES move above the diaphragm, the muscle wall that separates the stomach from the chest. Normally, the diaphragm helps the LES keep acid from rising up into the esophagus. When a hiatal hernia is present, acid reflux can occur more easily. A hiatal hernia can occur in people of any age and is most often a normal finding in otherwise healthy people over age 31. Most of the time, a hiatal hernia produces no symptoms.   Other factors that may contribute to GERD include - Obesity or recent weight gain - Pregnancy  - Smoking  - Diet - Certain medications  Common foods that can worsen reflux symptoms include: - carbonated beverages - artificial sweeteners - citrus fruits  - chocolate  - drinks with caffeine or alcohol  - fatty and fried foods  - garlic and onions  - mint flavorings  - spicy foods  - tomato-based foods, like spaghetti sauce, salsa, chili, and pizza   Lifestyle Changes If you smoke, stop.  Avoid foods and beverages that worsen symptoms (see above.) Lose weight if needed.  Eat small, frequent meals.  Wear loose-fitting clothes.  Avoid  lying down for 3 hours after a meal.  Raise the head of your bed 6 to 8 inches by securing wood blocks under the bedposts. Just using extra pillows will not help, but using a wedge-shaped pillow may be helpful.  Medications  H2 blockers, such as cimetidine (Tagamet HB), famotidine (Pepcid AC), nizatidine (Axid AR), and ranitidine (Zantac 75), decrease acid production. They are available in prescription strength and over-the-counter strength. These  drugs provide short-term relief and are effective for about half of those who have GERD symptoms.  Proton pump inhibitors include omeprazole (Prilosec, Zegerid), lansoprazole (Prevacid), pantoprazole (Protonix), rabeprazole (Aciphex), and esomeprazole (Nexium), which are available by prescription. Prilosec is also available in over-the-counter strength. Proton pump inhibitors are more effective than H2 blockers and can relieve symptoms and heal the esophageal lining in almost everyone who has GERD.  Because drugs work in different ways, combinations of medications may help control symptoms. People who get heartburn after eating may take both antacids and H2 blockers. The antacids work first to neutralize the acid in the stomach, and then the H2 blockers act on acid production. By the time the antacid stops working, the H2 blocker will have stopped acid production. Your health care provider is the best source of information about how to use medications for GERD.   Points to Remember 1. You can have GERD without having heartburn. Your symptoms could include a dry cough, asthma symptoms, or trouble swallowing.  2. Taking medications daily as prescribed is important in controlling you symptoms.  Sometimes it can take up to 8 weeks to fully achieve the effects of the medications prescribed.  3. Coughing related to GERD can be difficult to treat and is very frustrating!  However, it is important to stick with these medications and lifestyle modifications before pursuing more aggressive or invasive test and treatments.

## 2022-01-05 ENCOUNTER — Other Ambulatory Visit (HOSPITAL_COMMUNITY): Payer: Self-pay | Admitting: Internal Medicine

## 2022-01-07 ENCOUNTER — Encounter: Payer: Self-pay | Admitting: Internal Medicine

## 2022-01-07 ENCOUNTER — Ambulatory Visit (INDEPENDENT_AMBULATORY_CARE_PROVIDER_SITE_OTHER): Payer: Medicare Other | Admitting: Internal Medicine

## 2022-01-07 VITALS — BP 140/84 | HR 92 | Ht 65.5 in | Wt 182.4 lb

## 2022-01-07 DIAGNOSIS — E785 Hyperlipidemia, unspecified: Secondary | ICD-10-CM | POA: Diagnosis not present

## 2022-01-07 DIAGNOSIS — E1165 Type 2 diabetes mellitus with hyperglycemia: Secondary | ICD-10-CM

## 2022-01-07 DIAGNOSIS — E1159 Type 2 diabetes mellitus with other circulatory complications: Secondary | ICD-10-CM

## 2022-01-07 LAB — POCT GLYCOSYLATED HEMOGLOBIN (HGB A1C): Hemoglobin A1C: 11.8 % — AB (ref 4.0–5.6)

## 2022-01-07 MED ORDER — NOVOLOG FLEXPEN 100 UNIT/ML ~~LOC~~ SOPN: 8.0000 [IU] | PEN_INJECTOR | Freq: Three times a day (TID) | SUBCUTANEOUS | 3 refills | Status: DC

## 2022-01-07 NOTE — Patient Instructions (Addendum)
PLEASE STOP ALL SWEET DRINKS!  Please continue: - Farxiga 10 mg daily before b'fast - Tresiba 54 units daily  Stop Glimepiride.  Start: - NovoLog 8-10 units 15 min before each meal.  Please return in 1.5 months with your sugar log.   PATIENT INSTRUCTIONS FOR TYPE 2 DIABETES:  DIET AND EXERCISE Diet and exercise is an important part of diabetic treatment.  We recommended aerobic exercise in the form of brisk walking (working between 40-60% of maximal aerobic capacity, similar to brisk walking) for 150 minutes per week (such as 30 minutes five days per week) along with 3 times per week performing 'resistance' training (using various gauge rubber tubes with handles) 5-10 exercises involving the major muscle groups (upper body, lower body and core) performing 10-15 repetitions (or near fatigue) each exercise. Start at half the above goal but build slowly to reach the above goals. If limited by weight, joint pain, or disability, we recommend daily walking in a swimming pool with water up to waist to reduce pressure from joints while allow for adequate exercise.    BLOOD GLUCOSES Monitoring your blood glucoses is important for continued management of your diabetes. Please check your blood glucoses 2-4 times a day: fasting, before meals and at bedtime (you can rotate these measurements - e.g. one day check before the 3 meals, the next day check before 2 of the meals and before bedtime, etc.).   HYPOGLYCEMIA (low blood sugar) Hypoglycemia is usually a reaction to not eating, exercising, or taking too much insulin/ other diabetes drugs.  Symptoms include tremors, sweating, hunger, confusion, headache, etc. Treat IMMEDIATELY with 15 grams of Carbs: 4 glucose tablets  cup regular juice/soda 2 tablespoons raisins 4 teaspoons sugar 1 tablespoon honey Recheck blood glucose in 15 mins and repeat above if still symptomatic/blood glucose <100.  RECOMMENDATIONS TO REDUCE YOUR RISK OF DIABETIC  COMPLICATIONS: * Take your prescribed MEDICATION(S) * Follow a DIABETIC diet: Complex carbs, fiber rich foods, (monounsaturated and polyunsaturated) fats * AVOID saturated/trans fats, high fat foods, >2,300 mg salt per day. * EXERCISE at least 5 times a week for 30 minutes or preferably daily.  * DO NOT SMOKE OR DRINK more than 1 drink a day. * Check your FEET every day. Do not wear tightfitting shoes. Contact us if you develop an ulcer * See your EYE doctor once a year or more if needed * Get a FLU shot once a year * Get a PNEUMONIA vaccine once before and once after age 31 years  GOALS:  * Your Hemoglobin A1c of <7%  * fasting sugars need to be <130 * after meals sugars need to be <180 (2h after you start eating) * Your Systolic BP should be 045 or lower  * Your Diastolic BP should be 80 or lower  * Your HDL (Good Cholesterol) should be 40 or higher  * Your LDL (Bad Cholesterol) should be 100 or lower. * Your Triglycerides should be 150 or lower  * Your Urine microalbumin (kidney function) should be <30 * Your Body Mass Index should be 25 or lower    Please consider the following ways to cut down carbs and fat and increase fiber and micronutrients in your diet: - substitute whole grain for white bread or pasta - substitute brown rice for white rice - substitute 90-calorie flat bread pieces for slices of bread when possible - substitute sweet potatoes or yams for white potatoes - substitute humus for margarine - substitute tofu for cheese when possible -  substitute almond or rice milk for regular milk (would not drink soy milk daily due to concern for soy estrogen influence on breast cancer risk) - substitute dark chocolate for other sweets when possible - substitute water - can add lemon or orange slices for taste - for diet sodas (artificial sweeteners will trick your body that you can eat sweets without getting calories and will lead you to overeating and weight gain in the long  run) - do not skip breakfast or other meals (this will slow down the metabolism and will result in more weight gain over time)  - can try smoothies made from fruit and almond/rice milk in am instead of regular breakfast - can also try old-fashioned (not instant) oatmeal made with almond/rice milk in am - order the dressing on the side when eating salad at a restaurant (pour less than half of the dressing on the salad) - eat as little meat as possible - can try juicing, but should not forget that juicing will get rid of the fiber, so would alternate with eating raw veg./fruits or drinking smoothies - use as little oil as possible, even when using olive oil - can dress a salad with a mix of balsamic vinegar and lemon juice, for e.g. - use agave nectar, stevia sugar, or regular sugar rather than artificial sweateners - steam or broil/roast veggies  - snack on veggies/fruit/nuts (unsalted, preferably) when possible, rather than processed foods - reduce or eliminate aspartame in diet (it is in diet sodas, chewing gum, etc) Read the labels!  Try to read Dr. Janene Harvey book: "Program for Reversing Diabetes" for other ideas for healthy eating.

## 2022-01-07 NOTE — Addendum Note (Signed)
Addended by: Lauralyn Primes on: 01/07/2022 01:41 PM   Modules accepted: Orders

## 2022-01-07 NOTE — Progress Notes (Signed)
Patient ID: Calvin [name], male   DOB: [birth year], 61 y.o.   MRN: [id number]  HPI: Calvin [name] is a 61 y.o.-year-old male, returning for follow-up for DM2, dx in 2009, insulin-dependent soon after diagnosis, uncontrolled, with complications (CAD, CHF, CKD, DR, PN, ED). Pt. previously saw Dr. Loanne Drilling, last visit 4 months ago.  Reviewed HbA1c: Lab Results  Component Value Date   HGBA1C 10.4 (A) 09/15/2021   HGBA1C 7.8 (A) 05/17/2021   HGBA1C 7.6 (H) 02/08/2021   HGBA1C 7.3 (A) 01/14/2021   HGBA1C 8.9 (A) 10/15/2020   HGBA1C 8.4 (A) 08/11/2020   HGBA1C 8.9 (A) 06/09/2020   HGBA1C 9.3 (A) 04/22/2020   HGBA1C 9.9 (A) 03/03/2020   HGBA1C 10.1 (A) 01/15/2020   Pt is on a regimen of: - Amaryl 2 mg in a.m. - Farxiga 10 mg in am - Tresiba U200 54 units daily  Pt checks his sugars 1x every other day and they are: - am: 240-250 - 2h after b'fast: n/c - before lunch: n/c - 2h after lunch: n/c - before dinner: n/c - 2h after dinner: n/c - bedtime: 240s - nighttime: n/c Lowest sugar was 200; ? hypoglycemia awareness. Highest sugar was 300s.  Glucometer: One Touch Verio  Pt's meals are: - Breakfast: egg sandwich - Lunch: spaghetti, greens, apple - Dinner: chicken + green veggies - Snacks: cake, chips Drinks regular sodas: 2 a day.   - + CKD, last BUN/creatinine:  Lab Results  Component Value Date   BUN 18 11/23/2021   BUN 15 11/22/2021   CREATININE 1.83 (H) 11/23/2021   CREATININE 1.72 (H) 11/22/2021  He is on Cozaar 25 mg daily  -+ HL; last set of lipids: Lab Results  Component Value Date   CHOL 129 05/25/2020   HDL 54 05/25/2020   LDLCALC 58 05/25/2020   TRIG 85 05/25/2020   CHOLHDL 2.4 05/25/2020  He is on Crestor 40 mg daily, Zetia 10 mg daily, and Repatha.  - last eye exam was in on 10/25/2021 (Dr. Coralyn Pear): Severe NPDR OU + macular edema, cataract OU  - + numbness and tingling in his feet.  He is on Neurontin 100 mg 3 times a day. Last foot exam:  01/2022 - sees podiatry.  He also has a history of OSA-on CPAP, Alzheimer's disease, GERD.  ROS: + see HPI No increased urination, + blurry vision, no nausea, no chest pain.  Past Medical History:  Diagnosis Date   Adenoidal hypertrophy    Adrenal nodule 09/21/2020   AF (paroxysmal atrial fibrillation) 03/10/2018   AKI (acute kidney injury) 06/17/2019   Arthritis    Cataract    Mixed OU   Chronic anticoagulation 67/20/9470   Chronic systolic heart failure 96/28/3662   Coronary artery disease    a. cath in 08/2017 showing mild nonobstructive CAD with scattered 20-30% stenosis.    Diabetic neuropathy 09/26/2017   Diabetic retinopathy    NPDR OU   Essential hypertension 11/22/2015   Foot pain, bilateral 02/01/2019   Gastroesophageal reflux disease 05/10/2017   Heart attack    While living in [place]   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 EF 20-25% by echo in 09/2017 with  cath showing mild CAD. b.  Last echo 12/2017 EF 35-40%, grade 2 DD.   Obesity    Onychomycosis of multiple toenails with type 2 diabetes mellitus and peripheral neuropathy 05/10/2017   OSA on CPAP 05/10/2017   Right sided weakness 06/17/2019   Type II diabetes mellitus 11/20/2015   Unsteady gait 12/20/2018   Past Surgical History:  Procedure Laterality Date   BIOPSY  11/22/2021   Procedure: BIOPSY;  Surgeon: Irene Shipper, MD;  Location: [facility];  Service: Gastroenterology;;   COLONOSCOPY  05/13/11, 08/2017   9 adenomas   COLONOSCOPY WITH PROPOFOL N/A 11/22/2021   Procedure: COLONOSCOPY WITH PROPOFOL;  Surgeon: Irene Shipper, MD;  Location: [facility];   Service: Gastroenterology;  Laterality: N/A;   ESOPHAGOGASTRODUODENOSCOPY (EGD) WITH PROPOFOL N/A 11/22/2021   Procedure: ESOPHAGOGASTRODUODENOSCOPY (EGD) WITH PROPOFOL;  Surgeon: Irene Shipper, MD;  Location: [facility];  Service: Gastroenterology;  Laterality: N/A;   FOREARM SURGERY     MUSCLE BIOPSY     POLYPECTOMY     POLYPECTOMY  11/22/2021   Procedure: POLYPECTOMY;  Surgeon: Irene Shipper, MD;  Location: [facility];  Service: Gastroenterology;;   RIGHT/LEFT HEART CATH AND CORONARY ANGIOGRAPHY N/A 08/25/2017   Procedure: RIGHT/LEFT HEART CATH AND CORONARY ANGIOGRAPHY;  Surgeon: [name], MD;  Location: [facility];  Service: Cardiovascular;  Laterality: N/A;   RIGHT/LEFT HEART CATH AND CORONARY ANGIOGRAPHY N/A 10/01/2019   Procedure: RIGHT/LEFT HEART CATH AND CORONARY ANGIOGRAPHY;  Surgeon: [name], MD;  Location: [facility];  Service: Cardiovascular;  Laterality: N/A;   RIGHT/LEFT HEART CATH AND CORONARY ANGIOGRAPHY N/A 04/07/2021   Procedure: RIGHT/LEFT HEART CATH AND CORONARY ANGIOGRAPHY;  Surgeon: [name], MD;  Location: [facility];  Service: Cardiovascular;  Laterality: N/A;   Social History   Socioeconomic History   Marital status: Married    Spouse name: Not on file   Number of children: 0   Years of education: 13   Highest education level: Some college, no degree  Occupational History   Occupation: Disability  Tobacco Use   Smoking status: Never   Smokeless tobacco: Never  Vaping Use   Vaping Use: Never used  Substance and Sexual Activity   Alcohol use: No   Drug use: No   Sexual activity: Yes    Birth control/protection: None  Other Topics Concern   Not on file  Social History Narrative   Social History   Diet?    Do you drink/eat things with caffeine? yes   Marital status?       single      Do you live in a house, apartment, assisted living, condo, trailer, etc.? yes   Is it one or more stories? One  story   How many persons live in your home?   Do you have any pets in your home? (please list)    Highest level of education completed? graduate   Do you exercise?            no                          Type & how often?   Advanced Directives   Do you have a living will? no   Do you have a DNR form?  If not, do you want to discuss one? no   Do you have signed POA/HPOA for forms? no      Functional Status   Do you have difficulty bathing or dressing yourself? no   Do you have difficulty preparing food or eating? no   Do you have difficulty managing your medications? no   Do you have difficulty managing your finances? no   Do you have difficulty affording your medications? no   Social Determinants of Health   Financial Resource Strain: Not on file  Food Insecurity: No Food Insecurity (08/25/2021)   Hunger Vital Sign    Worried About Running Out of Food in the Last Year: Never true    Ran Out of Food in the Last Year: Never true  Transportation Needs: Unknown (02/15/2021)   PRAPARE - Hydrologist (Medical): No    Lack of Transportation (Non-Medical): Not on file  Physical Activity: Not on file  Stress: Not on file  Social Connections: Not on file  Intimate Partner Violence: Not on file   Current Outpatient Medications on File Prior to Visit  Medication Sig Dispense Refill   albuterol (VENTOLIN HFA) 108 (90 Base) MCG/ACT inhaler Inhale 2 puffs into the lungs every 6 (six) hours as needed for wheezing or shortness of breath. 6.7 g 0   amitriptyline (ELAVIL) 25 MG tablet Take 25 mg by mouth at bedtime.     bisacodyl (DULCOLAX) 5 MG EC tablet Take 2 tablets (10 mg total) by mouth every other day. Take at bedtime 30 tablet 0   Blood Glucose Monitoring Suppl ([place]) w/Device KIT 1 each by Other route daily.     carvedilol (COREG) 3.125 MG tablet Take 1 tablet (3.125 mg total) by mouth 2 (two) times daily with  a meal. 60 tablet 10   donepezil (ARICEPT) 10 MG tablet Take 1 tablet (10 mg total) by mouth at bedtime. 30 tablet 11   ELIQUIS 5 MG TABS tablet TAKE ONE TABLET BY MOUTH EVERY MORNING and TAKE ONE TABLET BY MOUTH EVERYDAY AT BEDTIME (Patient taking differently: Take 5 mg by mouth 2 (two) times daily.) 60 tablet 3   ezetimibe (ZETIA) 10 MG tablet TAKE ONE TABLET BY MOUTH EVERY MORNING 30 tablet 11   FARXIGA 10 MG TABS tablet TAKE ONE TABLET BY MOUTH EVERY MORNING 90 tablet 3   gabapentin (NEURONTIN) 100 MG capsule TAKE ONE CAPSULE BY MOUTH EVERY MORNING and TAKE ONE CAPSULE BY MOUTH AT NOON and TAKE ONE CAPSULE BY MOUTH EVERYDAY AT BEDTIME (Patient taking differently: 100 mg 3 (three) times daily.) 270 capsule 0   glimepiride (AMARYL) 2 MG tablet Take 2 mg by mouth daily with breakfast.     glucose blood (ONETOUCH VERIO) test strip 1 each by Other route 2 (two) times daily. And lancets 2/day 200 each 3   insulin degludec (TRESIBA FLEXTOUCH) 200 UNIT/ML FlexTouch Pen Inject 54 Units into the skin daily. And pen needles 1/day 18 mL 3   Insulin Syringe-Needle U-100 (INSULIN SYRINGE .3CC/29GX1/2") 29G X 1/2" 0.3 ML MISC Inject 1 Syringe 3 (three) times daily as directed. Check blood sugar three times daily. Dx: E11.9 100 each 3   Lancets (ONETOUCH DELICA PLUS OEHOZY24M) MISC USE TWICE DAILY (Patient taking differently: 1 each by Other route 2 (two) times daily.) 200 each 3   losartan (COZAAR) 25 MG tablet TAKE ONE TABLET BY MOUTH ONCE DAILY 30 tablet 4   nitroGLYCERIN (NITROSTAT) 0.4 MG SL  tablet Place 1 tablet (0.4 mg total) under the tongue every 5 (five) minutes as needed for chest pain. 30 tablet 1   ondansetron (ZOFRAN-ODT) 4 MG disintegrating tablet Take 1 tablet (4 mg total) by mouth every 8 (eight) hours as needed for nausea or vomiting. 8 tablet 0   pantoprazole (PROTONIX) 40 MG tablet Take 1 tablet (40 mg total) by mouth 2 (two) times daily before a meal. 60 tablet 5   polyethylene glycol  (MIRALAX / GLYCOLAX) 17 g packet Take 17 g by mouth 2 (two) times daily. 14 each 0   REPATHA 140 MG/ML SOSY INJECT 1 PEN INTO THE SKIN EVERY 14 DAYS (Patient taking differently: Inject 149 mg as directed every 14 (fourteen) days.) 6 mL 3   rosuvastatin (CRESTOR) 40 MG tablet Take 40 mg by mouth at bedtime.     sildenafil (VIAGRA) 100 MG tablet Take 1 tablet (100 mg total) by mouth daily as needed for erectile dysfunction. 10 tablet 11   spironolactone (ALDACTONE) 25 MG tablet TAKE ONE TABLET BY MOUTH ONCE DAILY 90 tablet 3   traMADol (ULTRAM) 50 MG tablet Take 50 mg by mouth every 6 (six) hours as needed for moderate pain.     No current facility-administered medications on file prior to visit.   No Known Allergies Family History  Problem Relation Age of Onset   Heart disease Mother    Heart attack Mother 47   Hypertension Mother    Hyperlipidemia Mother    Diabetes Mother    Heart disease Father    Heart attack Father 48   Hypertension Father    Hyperlipidemia Father    Diabetes Father    Heart attack Sister 95   Colon cancer Neg Hx    Stomach cancer Neg Hx    Esophageal cancer Neg Hx    Rectal cancer Neg Hx    Colon polyps Neg Hx    Lung disease Neg Hx     PE: BP 140/84 (BP Location: Right Arm, Patient Position: Sitting, Cuff Size: Normal)   Pulse 92   Ht 5' 5.5" (1.664 m)   Wt 182 lb 6.4 oz (82.7 kg)   SpO2 95%   BMI 29.89 kg/m  Wt Readings from Last 3 Encounters:  12/23/21 181 lb 9.6 oz (82.4 kg)  12/22/21 183 lb 3.2 oz (83.1 kg)  12/16/21 179 lb (81.2 kg)   Constitutional: Slightly overweight, in NAD Eyes: PERRLA, EOMI, no exophthalmos ENT: moist mucous membranes, no thyromegaly, no cervical lymphadenopathy Cardiovascular: RRR, No MRG Respiratory: CTA B Musculoskeletal: no deformities Skin: moist, warm, no rashes Neurological: no tremor with outstretched hands  ASSESSMENT: 1. DM2, insulin-dependent, uncontrolled, with complications - CAD, h/o AMI - CHF -  CKD - DR  - PN - ED, on Viagra - Intermittent claudication - severe hypoglycemia in 2021  2. HL  PLAN:  1. Patient with long-standing, uncontrolled diabetes, on oral antidiabetic regimen with sulfonylurea and SGLT2 inhibitor, and also long-acting insulin, with still poor control.  Latest HbA1c obtained 4 months ago was higher than before, at 10.4%.  At today's visit, however, his HbA1c is even higher, at 11.8%.  He mentions blurry vision, but no increased urination, weight loss, or GI symptoms. - At this visit, we discussed that he is glucotoxicity right now so he will need more insulin to bring his sugars down.  Therefore, I suggested to continue his [name] but at mealtime insulin before each meal.  We discussed about how to inject this correctly,  15 minutes before each meal, and to vary the dose of the insulin based on the size and consistency of his meals.  He voices good understanding of this.  In the meantime, I advised him to stop glimepiride, which I do not feel is helping him too much right now and continue to [place].  He has no problems affording his medicines. - We discussed about the absolute importance of stopping any sweet drinks.  He is drinking 2 regular sodas daily -I explained that without stopping these, we will not be able to control his diabetes.  At next visit, I am hoping that we can possibly taper his mealtime insulin to off and use a GLP-1 receptor agonist. - I suggested to:  Patient Instructions  PLEASE STOP ALL SWEET DRINKS!  Please continue: - Farxiga 10 mg daily before b'fast - Tresiba 54 units daily  Stop Glimepiride.  Start: - NovoLog 8-10 units 15 min before each meal.  Please return in 1.5 months with your sugar log.   - check sugars at different times of the day - check 3x a day, rotating checks - discussed about CBG targets for treatment: 80-130 mg/dL before meals and <180 mg/dL after meals; target HbA1c <7%. - given foot care handout  - given  instructions for hypoglycemia management "15-15 rule"  - advised for yearly eye exams  - Return to clinic in 1.5 mo with sugar log   2. HL - Reviewed latest lipid panel from 2021: LDL close to goal of less than 55 due to cardiovascular disease, the rest of the fractions at goal: Lab Results  Component Value Date   CHOL 129 05/25/2020   HDL 54 05/25/2020   LDLCALC 58 05/25/2020   TRIG 85 05/25/2020   CHOLHDL 2.4 05/25/2020  - Continues Crestor 40 mg daily, Zetia 10 mg daily, and Repatha without side effects. - He is due for another lipid panel -we will check at next visit, when hopefully his diabetes is better controlled  - Total time spent for the visit: 40 min, in obtaining medical information from the patient and from the chart, reviewing [name] last note, his  previous labs, evaluations, and treatments, reviewing his symptoms, counseling him about his conditions (please see the discussed topics above), and developing a plan to further investigate and treat them.   Philemon Kingdom, MD PhD [facility] End[facility]ology

## 2022-01-10 ENCOUNTER — Encounter (INDEPENDENT_AMBULATORY_CARE_PROVIDER_SITE_OTHER): Payer: Medicare Other | Admitting: Ophthalmology

## 2022-01-10 ENCOUNTER — Encounter (INDEPENDENT_AMBULATORY_CARE_PROVIDER_SITE_OTHER): Payer: Self-pay

## 2022-01-10 DIAGNOSIS — E113413 Type 2 diabetes mellitus with severe nonproliferative diabetic retinopathy with macular edema, bilateral: Secondary | ICD-10-CM

## 2022-01-10 DIAGNOSIS — H25813 Combined forms of age-related cataract, bilateral: Secondary | ICD-10-CM

## 2022-01-10 DIAGNOSIS — I10 Essential (primary) hypertension: Secondary | ICD-10-CM

## 2022-01-10 DIAGNOSIS — H35033 Hypertensive retinopathy, bilateral: Secondary | ICD-10-CM

## 2022-01-12 ENCOUNTER — Ambulatory Visit: Payer: Medicare Other | Admitting: Internal Medicine

## 2022-01-25 ENCOUNTER — Ambulatory Visit: Payer: Medicare Other | Admitting: *Deleted

## 2022-02-03 ENCOUNTER — Other Ambulatory Visit: Payer: Self-pay | Admitting: Internal Medicine

## 2022-02-06 NOTE — Progress Notes (Signed)
ADVANCED HF CLINIC NOTE  Referring Physician: Primary Care: Dr [name]  Primary Cardiologist: Dr Angelena Form  Neurology: Dr Delice Lesch  [facility]: Dr. Haroldine Laws   HPI Mr Netto is a 61 yo male with history of chronic systolic HF due to NICM, uncontrolled DM2, PAF, GERD, HTN, MI 2015, memory issues, and hyperlipidemia.   Admitted 2/19 with CP. Underwent LHC/RHC with mild nonobstructive CAD & EF 35-40%.  Readmitted in 10/2017 with chest pain. CTA was negative for PE.   Had sleep study 07/2018 that was normal.   Echo 7/20 EF 35-40%, mild LVH, normal RV size and function. No significant valvular dysfunction.   Seen in HF clinic 3/21 for first time/ R/L cath in 3/21 with non-obstructive CAD (LAD 77m 60d, LCX 40% diffuse, RCA 30p, 515m RHC well compensated PCWP 8 CI 4.3  Echo  02/08/21 EF 30-35% RV mild HK Personally reviewed  CPX 08/22 with severe HF limitation.  BP rest: 110/64 BP peak: 114/60  Peak VO2: 13.3 (52% predicted peak VO2)  VE/VCO2 slope:  57  OUES: 0.95  Peak RER: 1.07  Ventilatory Threshold: 10.3 (40% predicted or measured peak VO2)  Peak RR 80  Peak Ventilation:  65.3  VE/MVV:  60%  PETCO2 at peak:  26  O2pulse:  10   (75% predicted O2pulse)   cMRI 03/17/21 with LVEF 45%, no LGE  Repeat R/LHC in  04/07/21 for severe CP and dyspnea  mild nonobstructive CAD, LVEF recovered to 55-60% by visual estimate, normal hemodynamics  Admitted 5/23 with chronic ab pain and constipation.  GI performed endoscopy which was overall unremarkable except small hiatal hernia, Colonoscopy showed inflammation and ulceration in rectum, sigmoid and descending colon suggestive of colitis  He is here today for f/u. Entresto stopped previously due to low BP. Doing pretty well. Occasional CP but not severe. Still with mild DOE. No edema, orthopnea or PND. Compliant with meds.    Studies cMRI 09/22 EF 45%, no LGE, RV okay, breathhold artifact noted  Echo 08/22 EF 30-35%, RV mildly reduced, trivial  MR Echo 09/27/19 EF 40-45% RV ok.  ECHO 12/2017  EF 35-40% Grade II DD. Moderate global reduction in LV systolic function; moderate   diastolic dysfunction. ECHO 09/2017  EF 20-25% Grade IDD  R/LHC 10/22: Findings: Ao = 144/69  LV = 144/9 RA = 3 RV = 30/6 PA = 26/9 (16) PCW = 10 Fick cardiac output/index = 5.8/3.1 SVR = 1370 PVR =  1.0 WU Ao sat = 95% PA sat = 73%, 74% SVC sat = 79%   Mid LAD lesion is 40% stenosed.   Prox Cx to Dist Cx lesion is 40% stenosed.   Dist RCA lesion is 30% stenosed.   Prox RCA to Mid RCA lesion is 40% stenosed.   The left ventricular ejection fraction is 55-65% by visual estimate.     R/L cath in 3/21: with non-obstructive CAD (LAD 4039m0d, LCX 40% diffuse, RCA 30p, 13m69mHC well compensated PCWP 8 CI 4.3  LHC/RHC 08/2017  RA 5  PA 22/11 (15)  PCWP 9  CO 6 CI 3/14  Prox RCA to Mid RCA lesion is 30% stenosed. Mid RCA to Dist RCA lesion is 30% stenosed. Prox Cx to Dist Cx lesion is 20% stenosed. Mid LAD lesion is 30% stenosed. There is mild to moderate left ventricular systolic dysfunction. LV end diastolic pressure is mildly elevated. The left ventricular ejection fraction is 35-45% by visual estimate. There is no mitral valve regurgitation.  SH: Disabled for 6 years due to diabetes. He does not smoke. Denies cocaine abuse.  Past Medical History:  Diagnosis Date   Adenoidal hypertrophy    Adrenal nodule 09/21/2020   AF (paroxysmal atrial fibrillation) 03/10/2018   AKI (acute kidney injury) 06/17/2019   Arthritis    Cataract    Mixed OU   Chronic anticoagulation 68/02/8109   Chronic systolic heart failure 31/59/4585   Coronary artery disease    a. cath in 08/2017 showing mild nonobstructive CAD with scattered 20-30% stenosis.    Diabetic neuropathy 09/26/2017   Diabetic retinopathy    NPDR OU   Essential hypertension 11/22/2015   Foot pain, bilateral 02/01/2019   Gastroesophageal reflux disease 05/10/2017   Heart attack     While living in [place]   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 EF 20-25% by echo in 09/2017 with cath showing mild CAD. b.  Last echo 12/2017 EF 35-40%, grade 2 DD.   Obesity    Onychomycosis of multiple toenails with type 2 diabetes mellitus and peripheral neuropathy 05/10/2017   OSA on CPAP 05/10/2017   Right sided weakness 06/17/2019   Type II diabetes mellitus 11/20/2015   Unsteady gait 12/20/2018    Current Outpatient Medications  Medication Sig Dispense Refill   albuterol (VENTOLIN HFA) 108 (90 Base) MCG/ACT inhaler Inhale 2 puffs into the lungs every 6 (six) hours as needed for wheezing or shortness of breath. 6.7 g 0   amitriptyline (ELAVIL) 25 MG tablet Take 25 mg by mouth at bedtime.     bisacodyl (DULCOLAX) 5 MG EC tablet Take 2 tablets (10 mg total) by mouth every other day. Take at bedtime 30 tablet 0   Blood Glucose Monitoring Suppl ([place]) w/Device KIT 1 each by Other route daily.     carvedilol (COREG) 3.125 MG tablet Take 1 tablet (3.125 mg total) by mouth 2 (two) times daily with a meal. 60 tablet 10   donepezil (ARICEPT) 10 MG tablet Take 1 tablet (10 mg total) by mouth at bedtime. 30 tablet 11   ELIQUIS 5 MG TABS tablet TAKE ONE TABLET BY MOUTH EVERY MORNING and TAKE ONE TABLET BY MOUTH EVERYDAY AT BEDTIME 60 tablet 5   ezetimibe (ZETIA) 10 MG tablet TAKE ONE TABLET BY MOUTH EVERY MORNING 30 tablet 11   FARXIGA 10 MG TABS tablet TAKE ONE TABLET BY MOUTH EVERY MORNING 90 tablet 3   gabapentin (NEURONTIN) 100 MG capsule TAKE ONE CAPSULE BY MOUTH EVERY MORNING and TAKE ONE CAPSULE BY MOUTH AT NOON and TAKE  ONE CAPSULE BY MOUTH EVERYDAY AT BEDTIME 270 capsule 0   glucose blood (ONETOUCH VERIO) test strip 1 each by Other route 2 (two) times daily. And lancets 2/day 200 each 3   insulin degludec (TRESIBA FLEXTOUCH) 200 UNIT/ML FlexTouch Pen Inject 54 Units into the skin daily. And pen needles 1/day 18 mL 3   Insulin Syringe-Needle U-100 (INSULIN SYRINGE .3CC/29GX1/2") 29G X 1/2" 0.3 ML MISC Inject 1 Syringe 3 (three) times daily as directed. Check blood sugar three times daily. Dx: E11.9 100 each 3   Lancets (ONETOUCH DELICA PLUS FYTWKM62M) MISC USE TWICE DAILY 200 each  3   losartan (COZAAR) 25 MG tablet TAKE ONE TABLET BY MOUTH ONCE DAILY 30 tablet 4   nitroGLYCERIN (NITROSTAT) 0.4 MG SL tablet Place 1 tablet (0.4 mg total) under the tongue every 5 (five) minutes as needed for chest pain. 30 tablet 1   ondansetron (ZOFRAN-ODT) 4 MG disintegrating tablet Take 1 tablet (4 mg total) by mouth every 8 (eight) hours as needed for nausea or vomiting. 8 tablet 0   pantoprazole (PROTONIX) 40 MG tablet Take 1 tablet (40 mg total) by mouth 2 (two) times daily before a meal. 60 tablet 5   polyethylene glycol (MIRALAX / GLYCOLAX) 17 g packet Take 17 g by mouth 2 (two) times daily. 14 each 0   rosuvastatin (CRESTOR) 40 MG tablet Take 40 mg by mouth at bedtime.     sildenafil (VIAGRA) 100 MG tablet Take 1 tablet (100 mg total) by mouth daily as needed for erectile dysfunction. 10 tablet 11   spironolactone (ALDACTONE) 25 MG tablet TAKE ONE TABLET BY MOUTH ONCE DAILY 90 tablet 3   traMADol (ULTRAM) 50 MG tablet Take 50 mg by mouth every 6 (six) hours as needed for moderate pain.     REPATHA 140 MG/ML SOSY INJECT 1 PEN INTO THE SKIN EVERY 14 DAYS (Patient not taking: Reported on 02/07/2022) 6 mL 3   No current facility-administered medications for this encounter.    No Known Allergies    Social History   Socioeconomic History   Marital status: Married    Spouse name: Not on file   Number of children: 0   Years  of education: 13   Highest education level: Some college, no degree  Occupational History   Occupation: Disability  Tobacco Use   Smoking status: Never   Smokeless tobacco: Never  Vaping Use   Vaping Use: Never used  Substance and Sexual Activity   Alcohol use: No   Drug use: No   Sexual activity: Yes    Birth control/protection: None  Other Topics Concern   Not on file  Social History Narrative   Social History   Diet?    Do you drink/eat things with caffeine? yes   Marital status?       single      Do you live in a house, apartment, assisted living, condo, trailer, etc.? yes   Is it one or more stories? One story   How many persons live in your home?   Do you have any pets in your home? (please list)    Highest level of education completed? graduate   Do you exercise?            no                          Type & how often?   Advanced Directives   Do you have a living will? no   Do you have a DNR form?                                  If not, do you want to discuss one? no   Do you have signed POA/HPOA for forms? no      Functional Status   Do you have difficulty bathing or dressing yourself? no   Do you have difficulty preparing food or eating? no   Do you have difficulty managing your medications? no  Do you have difficulty managing your finances? no   Do you have difficulty affording your medications? no   Social Determinants of Health   Financial Resource Strain: Not on file  Food Insecurity: No Food Insecurity (08/25/2021)   Hunger Vital Sign    Worried About Running Out of Food in the Last Year: Never true    Ran Out of Food in the Last Year: Never true  Transportation Needs: Unknown (02/15/2021)   PRAPARE - Hydrologist (Medical): No    Lack of Transportation (Non-Medical): Not on file  Physical Activity: Not on file  Stress: Not on file  Social Connections: Not on file  Intimate Partner Violence: Not on file      Family  History  Problem Relation Age of Onset   Heart disease Mother    Heart attack Mother 106   Hypertension Mother    Hyperlipidemia Mother    Diabetes Mother    Heart disease Father    Heart attack Father 102   Hypertension Father    Hyperlipidemia Father    Diabetes Father    Heart attack Sister 44   Colon cancer Neg Hx    Stomach cancer Neg Hx    Esophageal cancer Neg Hx    Rectal cancer Neg Hx    Colon polyps Neg Hx    Lung disease Neg Hx     Vitals:   02/07/22 1221  BP: 110/60  Pulse: 99  SpO2: 99%  Weight: 85.4 kg (188 lb 3.2 oz)       Wt Readings from Last 3 Encounters:  02/07/22 85.4 kg (188 lb 3.2 oz)  01/07/22 82.7 kg (182 lb 6.4 oz)  12/23/21 82.4 kg (181 lb 9.6 oz)     PHYSICAL EXAM: General:  Well appearing. No resp difficulty HEENT: normal Neck: supple. no JVD. Carotids 2+ bilat; no bruits. No lymphadenopathy or thryomegaly appreciated. Cor: PMI nondisplaced. Regular rate & rhythm. No rubs, gallops or murmurs. Lungs: clear Abdomen: soft, nontender, nondistended. No hepatosplenomegaly. No bruits or masses. Good bowel sounds. Extremities: no cyanosis, clubbing, rash, edema Neuro: alert & orientedx3, cranial nerves grossly intact. moves all 4 extremities w/o difficulty. Affect pleasant  ECG: NSR 91 No ST-T wave abnormalities.  Personally reviewed   ASSESSMENT & PLAN:  1. Chronic Systolic /Diastolic Heart Failure with recovered LV function, NICM - ECHO 12/2017 EF 35-40%. LHC 2/219 with nonobstructive CAD.  - ECHO 01/2019, EF 35-40% - Echo 09/27/19 EF 40-45% RV ok  - R/L cath in 3/21 with non-obstructive CAD (LAD 18m 60d, LCX 40% diffuse, RCA 30p, 529m RHC well compensated PCWP 8 CI 4.3 - Echo 02/08/21 EF 30-35% RV mild HK Personally reviewed - CPX 08/22 severe HF limitation with blunted BP response, elevated VEOY/DX41lope - cMRI 09/22 LVEF 45%, no LGE - [facility] LVEF 55-60% visually, mild CAD, normal hemodynamics - Stable NYHA II-III  multiple  somatic complaints which appear to exacerbate HF symptoms - Volume status ok - Continue Farxiga 10 mg daily.  - Continue spironolactone 25 mg daily.  - Continue coreg 3.125 mg BID - Continue losartan 25 daily (failed Entresto x 2 with low BP) - Off digoxin d/t recovery in LV function - Will repeat echo   2. HTN - Blood pressure well controlled. Meds as above  3. Type 2 diabetes mellitus - hgb A1C 7.6 08/22 - Management per Dr. ElLoanne Drilling - Continue Farxiga. - No change  4. CAD:  - mild nonobstructive  CAD by cath 10/22 - Continues with occasional CP but doubt cardiac based on cath - no ASA w/ Eliquis.  - continue ? blocker. - on statin, repatha and zetia -> lipids managed by PCP  5. PAF - Eliquis for a/c.  - Remains in NSR - No bleeding   Glori Bickers, MD 02/07/22 12:50 PM

## 2022-02-07 ENCOUNTER — Ambulatory Visit (HOSPITAL_COMMUNITY)
Admission: RE | Admit: 2022-02-07 | Discharge: 2022-02-07 | Disposition: A | Discharge status: Home / Self Care | Payer: Medicare Other | Source: Ambulatory Visit | Attending: Internal Medicine | Admitting: Internal Medicine

## 2022-02-07 ENCOUNTER — Encounter (HOSPITAL_COMMUNITY): Payer: Self-pay | Admitting: Internal Medicine

## 2022-02-07 VITALS — BP 110/60 | HR 99 | Wt 188.2 lb

## 2022-02-07 DIAGNOSIS — E1136 Type 2 diabetes mellitus with diabetic cataract: Secondary | ICD-10-CM | POA: Diagnosis not present

## 2022-02-07 DIAGNOSIS — I110 Hypertensive heart disease with heart failure: Secondary | ICD-10-CM | POA: Diagnosis not present

## 2022-02-07 DIAGNOSIS — E1142 Type 2 diabetes mellitus with diabetic polyneuropathy: Secondary | ICD-10-CM | POA: Diagnosis not present

## 2022-02-07 DIAGNOSIS — E1151 Type 2 diabetes mellitus with diabetic peripheral angiopathy without gangrene: Secondary | ICD-10-CM | POA: Insufficient documentation

## 2022-02-07 DIAGNOSIS — I959 Hypotension, unspecified: Secondary | ICD-10-CM | POA: Diagnosis not present

## 2022-02-07 DIAGNOSIS — I428 Other cardiomyopathies: Secondary | ICD-10-CM | POA: Insufficient documentation

## 2022-02-07 DIAGNOSIS — E1140 Type 2 diabetes mellitus with diabetic neuropathy, unspecified: Secondary | ICD-10-CM

## 2022-02-07 DIAGNOSIS — E785 Hyperlipidemia, unspecified: Secondary | ICD-10-CM | POA: Insufficient documentation

## 2022-02-07 DIAGNOSIS — I5022 Chronic systolic (congestive) heart failure: Secondary | ICD-10-CM

## 2022-02-07 DIAGNOSIS — I10 Essential (primary) hypertension: Secondary | ICD-10-CM | POA: Diagnosis not present

## 2022-02-07 DIAGNOSIS — I480 Paroxysmal atrial fibrillation: Secondary | ICD-10-CM | POA: Diagnosis not present

## 2022-02-07 DIAGNOSIS — Z794 Long term (current) use of insulin: Secondary | ICD-10-CM

## 2022-02-07 DIAGNOSIS — I5042 Chronic combined systolic (congestive) and diastolic (congestive) heart failure: Secondary | ICD-10-CM | POA: Insufficient documentation

## 2022-02-07 DIAGNOSIS — Z79899 Other long term (current) drug therapy: Secondary | ICD-10-CM | POA: Diagnosis not present

## 2022-02-07 DIAGNOSIS — K219 Gastro-esophageal reflux disease without esophagitis: Secondary | ICD-10-CM | POA: Diagnosis not present

## 2022-02-07 DIAGNOSIS — I252 Old myocardial infarction: Secondary | ICD-10-CM | POA: Diagnosis not present

## 2022-02-07 DIAGNOSIS — I2510 Atherosclerotic heart disease of native coronary artery without angina pectoris: Secondary | ICD-10-CM | POA: Insufficient documentation

## 2022-02-07 LAB — BASIC METABOLIC PANEL
Anion gap: 7 (ref 5–15)
BUN: 19 mg/dL (ref 8–23)
CO2: 27 mmol/L (ref 22–32)
Calcium: 9.2 mg/dL (ref 8.9–10.3)
Chloride: 104 mmol/L (ref 98–111)
Creatinine, Ser: 1.52 mg/dL — ABNORMAL HIGH (ref 0.61–1.24)
GFR, Estimated: 52 mL/min — ABNORMAL LOW (ref >60)
Glucose, Bld: 246 mg/dL — ABNORMAL HIGH (ref 70–99)
Potassium: 4.1 mmol/L (ref 3.5–5.1)
Sodium: 138 mmol/L (ref 135–145)

## 2022-02-07 LAB — BRAIN NATRIURETIC PEPTIDE: B Natriuretic Peptide: 24.2 pg/mL (ref 0.0–100.0)

## 2022-02-07 MED ORDER — CARVEDILOL 6.25 MG PO TABS: 6.2500 mg | ORAL_TABLET | Freq: Two times a day (BID) | ORAL | 3 refills | Status: DC

## 2022-02-07 NOTE — Addendum Note (Signed)
Encounter addended by: Jerl Mina, RN on: 02/07/2022 12:59 PM  Actions taken: Diagnosis association updated, Order list changed, Charge Capture section accepted, Clinical Note Signed

## 2022-02-07 NOTE — Patient Instructions (Signed)
Increase Carvedilol to 6.54m Twice daily  Labs done today, your results will be available in MyChart, we will contact you for abnormal readings.  Your physician has requested that you have an echocardiogram. Echocardiography is a painless test that uses sound waves to create images of your heart. It provides your doctor with information about the size and shape of your heart and how well your heart's chambers and valves are working. This procedure takes approximately one hour. There are no restrictions for this procedure.  Your physician recommends that you schedule a follow-up appointment in: 3 months   If you have any questions or concerns before your next appointment please send [place] message through mConneautor call our office at [phone]    TO LEAVE A MESSAGE FOR THE NURSE SELECT OPTION 2, PLEASE LEAVE A MESSAGE INCLUDING: YOUR NAME DATE OF BIRTH CALL BACK NUMBER REASON FOR CALL**this is important as we prioritize the call backs  YOU WILL RECEIVE A CALL BACK THE SAME DAY AS LONG AS YOU CALL BEFORE 4:00 PM  [facility] you and your health needs are our priority. As part of our continuing mission to provide you with exceptional heart care, we have created designated Provider Care Teams. These Care Teams include your primary Cardiologist (physician) and Advanced Practice Providers (APPs- Physician Assistants and Nurse Practitioners) who all work together to provide you with the care you need, when you need it.   You may see any of the following providers on your designated Care Team at your next follow up: Dr DGlori BickersDr DHaynes Kerns NP BLyda Jester PU[facility]yclinic[facility] PUtahLAudry Riles PharmD   Please be sure to bring in all your medications bottles to every appointment.

## 2022-02-09 ENCOUNTER — Encounter (INDEPENDENT_AMBULATORY_CARE_PROVIDER_SITE_OTHER): Payer: Medicare Other | Admitting: Ophthalmology

## 2022-02-09 ENCOUNTER — Encounter (INDEPENDENT_AMBULATORY_CARE_PROVIDER_SITE_OTHER): Payer: Self-pay

## 2022-02-09 DIAGNOSIS — E113413 Type 2 diabetes mellitus with severe nonproliferative diabetic retinopathy with macular edema, bilateral: Secondary | ICD-10-CM

## 2022-02-09 DIAGNOSIS — H35033 Hypertensive retinopathy, bilateral: Secondary | ICD-10-CM

## 2022-02-09 DIAGNOSIS — I10 Essential (primary) hypertension: Secondary | ICD-10-CM

## 2022-02-09 DIAGNOSIS — H25813 Combined forms of age-related cataract, bilateral: Secondary | ICD-10-CM

## 2022-02-23 ENCOUNTER — Ambulatory Visit (HOSPITAL_COMMUNITY)
Admission: RE | Admit: 2022-02-23 | Discharge: 2022-02-23 | Disposition: A | Discharge status: Home / Self Care | Payer: Medicare Other | Source: Ambulatory Visit | Attending: Internal Medicine | Admitting: Internal Medicine

## 2022-02-23 DIAGNOSIS — I5022 Chronic systolic (congestive) heart failure: Secondary | ICD-10-CM

## 2022-02-23 LAB — ECHOCARDIOGRAM COMPLETE
AR max vel: 2.46 cm2
AV Area VTI: 2.49 cm2
AV Area mean vel: 2.3 cm2
AV Mean grad: 3 mmHg
AV Peak grad: 4.6 mmHg
Ao pk vel: 1.07 m/s
Area-P 1/2: 4.36 cm2
Calc EF: 34.7 %
MV VTI: 1.94 cm2
S' Lateral: 3.7 cm
Single Plane A2C EF: 37.2 %
Single Plane A4C EF: 33.2 %

## 2022-02-23 NOTE — Progress Notes (Signed)
  Echocardiogram 2D Echocardiogram has been performed.  Calvin [name] 02/23/2022, 9:12 AM

## 2022-03-01 ENCOUNTER — Other Ambulatory Visit: Payer: Self-pay

## 2022-03-01 DIAGNOSIS — R0602 Shortness of breath: Secondary | ICD-10-CM

## 2022-03-02 ENCOUNTER — Ambulatory Visit (INDEPENDENT_AMBULATORY_CARE_PROVIDER_SITE_OTHER): Payer: Medicare Other | Admitting: Internal Medicine

## 2022-03-02 ENCOUNTER — Encounter: Payer: Self-pay | Admitting: Internal Medicine

## 2022-03-02 VITALS — BP 124/80 | HR 83 | Ht 65.5 in | Wt 185.2 lb

## 2022-03-02 DIAGNOSIS — K219 Gastro-esophageal reflux disease without esophagitis: Secondary | ICD-10-CM

## 2022-03-02 DIAGNOSIS — R0600 Dyspnea, unspecified: Secondary | ICD-10-CM

## 2022-03-02 DIAGNOSIS — R0602 Shortness of breath: Secondary | ICD-10-CM

## 2022-03-02 LAB — PULMONARY FUNCTION TEST
DL/VA % pred: 94 %
DL/VA: 4.08 ml/min/mmHg/L
DLCO cor % pred: 73 %
DLCO cor: 17.37 ml/min/mmHg
DLCO unc % pred: 73 %
DLCO unc: 17.37 ml/min/mmHg
FEF 25-75 Post: 4.02 L/sec
FEF 25-75 Pre: 4.08 L/sec
FEF2575-%Change-Post: -1 %
FEF2575-%Pred-Post: 159 %
FEF2575-%Pred-Pre: 162 %
FEV1-%Change-Post: 0 %
FEV1-%Pred-Post: 88 %
FEV1-%Pred-Pre: 89 %
FEV1-Post: 2.69 L
FEV1-Pre: 2.69 L
FEV1FVC-%Change-Post: 2 %
FEV1FVC-%Pred-Pre: 116 %
FEV6-%Change-Post: -2 %
FEV6-%Pred-Post: 78 %
FEV6-%Pred-Pre: 80 %
FEV6-Post: 2.98 L
FEV6-Pre: 3.07 L
FEV6FVC-%Pred-Post: 105 %
FEV6FVC-%Pred-Pre: 105 %
FVC-%Change-Post: -2 %
FVC-%Pred-Post: 74 %
FVC-%Pred-Pre: 76 %
FVC-Post: 2.98 L
FVC-Pre: 3.07 L
Post FEV1/FVC ratio: 90 %
Post FEV6/FVC ratio: 100 %
Pre FEV1/FVC ratio: 88 %
Pre FEV6/FVC Ratio: 100 %
RV % pred: 120 %
RV: 2.43 L
TLC % pred: 84 %
TLC: 5.12 L

## 2022-03-02 NOTE — Patient Instructions (Signed)
Follow up with me as needed or if breathing worsens or changes and your heart doctor feels it's not related to your heart.   Continue acid reflux medication.  You need to make changes to your diet in order to cure the reflux and come off this medicine if you don't want to cough.   What is GERD? Gastroesophageal reflux disease (GERD) is gastroesophageal reflux diseasewhich occurs when the lower esophageal sphincter (LES) opens spontaneously, for varying periods of time, or does not close properly and stomach contents rise up into the esophagus. GER is also called acid reflux or acid regurgitation, because digestive juices--called acids--rise up with the food. The esophagus is the tube that carries food from the mouth to the stomach. The LES is a ring of muscle at the bottom of the esophagus that acts like a valve between the esophagus and stomach.  When acid reflux occurs, food or fluid can be tasted in the back of the mouth. When refluxed stomach acid touches the lining of the esophagus it may cause a burning sensation in the chest or throat called heartburn or acid indigestion. Occasional reflux is common. Persistent reflux that occurs more than twice a week is considered GERD, and it can eventually lead to more serious health problems. People of all ages can have GERD. Studies have shown that GERD may worsen or contribute to asthma, chronic cough, and pulmonary fibrosis.   What are the symptoms of GERD? The main symptom of GERD in adults is frequent heartburn, also called acid indigestion--burning-type pain in the lower part of the mid-chest, behind the breast bone, and in the mid-abdomen.  Not all reflux is acidic in nature, and many patients don't have heart burn at all. Sometimes it feels like a cough (either dry or with mucus), choking sensation, asthma, shortness of breath, waking up at night, frequent throat clearing, or trouble swallowing.    What causes GERD? The reason some people develop  GERD is still unclear. However, research shows that in people with GERD, the LES relaxes while the rest of the esophagus is working. Anatomical abnormalities such as a hiatal hernia may also contribute to GERD. A hiatal hernia occurs when the upper part of the stomach and the LES move above the diaphragm, the muscle wall that separates the stomach from the chest. Normally, the diaphragm helps the LES keep acid from rising up into the esophagus. When a hiatal hernia is present, acid reflux can occur more easily. A hiatal hernia can occur in people of any age and is most often a normal finding in otherwise healthy people over age 81. Most of the time, a hiatal hernia produces no symptoms.   Other factors that may contribute to GERD include - Obesity or recent weight gain - Pregnancy  - Smoking  - Diet - Certain medications  Common foods that can worsen reflux symptoms include: - carbonated beverages - artificial sweeteners - citrus fruits  - chocolate  - drinks with caffeine or alcohol  - fatty and fried foods  - garlic and onions  - mint flavorings  - spicy foods  - tomato-based foods, like spaghetti sauce, salsa, chili, and pizza   Lifestyle Changes If you smoke, stop.  Avoid foods and beverages that worsen symptoms (see above.) Lose weight if needed.  Eat small, frequent meals.  Wear loose-fitting clothes.  Avoid lying down for 3 hours after a meal.  Raise the head of your bed 6 to 8 inches by securing wood blocks under  the bedposts. Just using extra pillows will not help, but using a wedge-shaped pillow may be helpful.  Medications  H2 blockers, such as cimetidine (Tagamet HB), famotidine (Pepcid AC), nizatidine (Axid AR), and ranitidine (Zantac 75), decrease acid production. They are available in prescription strength and over-the-counter strength. These drugs provide short-term relief and are effective for about half of those who have GERD symptoms.  Proton pump inhibitors  include omeprazole (Prilosec, Zegerid), lansoprazole (Prevacid), pantoprazole (Protonix), rabeprazole (Aciphex), and esomeprazole (Nexium), which are available by prescription. Prilosec is also available in over-the-counter strength. Proton pump inhibitors are more effective than H2 blockers and can relieve symptoms and heal the esophageal lining in almost everyone who has GERD.  Because drugs work in different ways, combinations of medications may help control symptoms. People who get heartburn after eating may take both antacids and H2 blockers. The antacids work first to neutralize the acid in the stomach, and then the H2 blockers act on acid production. By the time the antacid stops working, the H2 blocker will have stopped acid production. Your health care provider is the best source of information about how to use medications for GERD.   Points to Remember 1. You can have GERD without having heartburn. Your symptoms could include a dry cough, asthma symptoms, or trouble swallowing.  2. Taking medications daily as prescribed is important in controlling you symptoms.  Sometimes it can take up to 8 weeks to fully achieve the effects of the medications prescribed.  3. Coughing related to GERD can be difficult to treat and is very frustrating!  However, it is important to stick with these medications and lifestyle modifications before pursuing more aggressive or invasive test and treatments.

## 2022-03-02 NOTE — Patient Instructions (Signed)
Full PFT Performed Today  

## 2022-03-02 NOTE — Progress Notes (Signed)
Brogen Duell    [id number]    04-23-1961  Primary Care Physician:Shah, Talbert Forest, MD Date of Appointment: 03/02/2022 Established Patient Visit  Chief complaint:   Chief Complaint  Patient presents with   Follow-up    He feels about the same since last OV and he said the breathing test makes him feel more tired and short of breath.      HPI: Calvin [name] is a 61 y.o. man with chronic cough and dyspnea.   Interval Updates: Here for follow up after PFTs which show normal pulmonary function.  He has since had an echocardiogram which shows HFrEF EF 35%.    We increased his PPI to BID  I have reviewed the patient's family social and past medical history and updated as appropriate.   Past Medical History:  Diagnosis Date   Adenoidal hypertrophy    Adrenal nodule 09/21/2020   AF (paroxysmal atrial fibrillation) 03/10/2018   AKI (acute kidney injury) 06/17/2019   Arthritis    Cataract    Mixed OU   Chronic anticoagulation 80/88/1103   Chronic systolic heart failure 15/94/5859   Coronary artery disease    a. cath in 08/2017 showing mild nonobstructive CAD with scattered 20-30% stenosis.    Diabetic neuropathy 09/26/2017   Diabetic retinopathy    NPDR OU   Essential hypertension 11/22/2015   Foot pain, bilateral 02/01/2019   Gastroesophageal reflux disease 05/10/2017   Heart attack    While living in [place]   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 EF 20-25% by echo in 09/2017 with cath showing mild CAD. b.  Last echo 12/2017 EF 35-40%, grade 2 DD.   Obesity    Onychomycosis of multiple toenails with  type 2 diabetes mellitus and peripheral neuropathy 05/10/2017   OSA on CPAP 05/10/2017   Right sided weakness 06/17/2019   Type II diabetes mellitus 11/20/2015   Unsteady gait 12/20/2018    Past Surgical History:  Procedure Laterality Date   BIOPSY  11/22/2021   Procedure: BIOPSY;  Surgeon: Irene Shipper, MD;  Location: [facility];  Service: Gastroenterology;;   COLONOSCOPY  05/13/11, 08/2017   9 adenomas   COLONOSCOPY WITH PROPOFOL N/A 11/22/2021   Procedure: COLONOSCOPY WITH PROPOFOL;  Surgeon: Irene Shipper, MD;  Location: [facility];  Service: Gastroenterology;  Laterality: N/A;   ESOPHAGOGASTRODUODENOSCOPY (EGD) WITH PROPOFOL N/A 11/22/2021   Procedure: ESOPHAGOGASTRODUODENOSCOPY (EGD) WITH PROPOFOL;  Surgeon: Irene Shipper, MD;  Location: [facility];  Service: Gastroenterology;  Laterality: N/A;   FOREARM SURGERY     MUSCLE BIOPSY     POLYPECTOMY     POLYPECTOMY  11/22/2021   Procedure: POLYPECTOMY;  Surgeon: Irene Shipper, MD;  Location: [facility];  Service: Gastroenterology;;   RIGHT/LEFT HEART CATH AND CORONARY ANGIOGRAPHY N/A 08/25/2017   Procedure: RIGHT/LEFT HEART CATH AND CORONARY ANGIOGRAPHY;  Surgeon: [name], MD;  Location: [facility];  Service: Cardiovascular;  Laterality: N/A;   RIGHT/LEFT HEART CATH AND CORONARY ANGIOGRAPHY N/A 10/01/2019   Procedure: RIGHT/LEFT HEART CATH AND CORONARY ANGIOGRAPHY;  Surgeon: [name], MD;  Location: [facility];  Service: Cardiovascular;  Laterality: N/A;   RIGHT/LEFT HEART CATH AND CORONARY ANGIOGRAPHY N/A 04/07/2021   Procedure: RIGHT/LEFT HEART CATH AND CORONARY ANGIOGRAPHY;  Surgeon: [name], MD;  Location: [facility];  Service: Cardiovascular;  Laterality: N/A;    Family History  Problem Relation Age of Onset   Heart disease Mother    Heart attack Mother 5   Hypertension Mother    Hyperlipidemia Mother    Diabetes Mother    Heart disease Father    Heart attack  Father 18   Hypertension Father    Hyperlipidemia Father    Diabetes Father    Heart attack Sister 26   Colon cancer Neg Hx    Stomach cancer Neg Hx    Esophageal cancer Neg Hx    Rectal cancer Neg Hx    Colon polyps Neg Hx    Lung disease Neg Hx     Social History   Occupational History   Occupation: Disability  Tobacco Use   Smoking status: Never   Smokeless tobacco: Never  Vaping Use   Vaping Use: Never used  Substance and Sexual Activity   Alcohol use: No   Drug use: No   Sexual activity: Yes    Birth control/protection: None     Physical Exam: Blood pressure 124/80, pulse 83, height 5' 5.5" (1.664 m), weight 185 lb 3.2 oz (84 kg), SpO2 97 %.  Gen:      No acute distress ENT:  no nasal polyps, mucus membranes moist Lungs:    No increased respiratory effort, symmetric chest wall excursion, clear to auscultation bilaterally, no wheezes or crackles CV:         Regular rate and rhythm; no murmurs, rubs, or gallops.  No pedal edema   Data Reviewed: Imaging: I have personally reviewed the   PFTs:     Latest Ref Rng & Units 03/02/2022   10:05 AM  PFT Results  FVC-Pre L 3.07  P  FVC-Predicted Pre % 76  P  FVC-Post L 2.98  P  FVC-Predicted Post % 74  P  Pre FEV1/FVC % % 88  P  Post FEV1/FCV % % 90  P  FEV1-Pre L 2.69  P  FEV1-Predicted Pre % 89  P  FEV1-Post L 2.69  P  DLCO uncorrected ml/min/mmHg 17.37  P  DLCO UNC% % 73  P  DLCO corrected ml/min/mmHg 17.37  P  DLCO COR %Predicted % 73  P  DLVA Predicted % 94  P  TLC L 5.12  P  TLC % Predicted % 84  P  RV % Predicted % 120  P    P Preliminary result   I have personally reviewed the patient's PFTs and normal pulmonary function  Labs: Lab Results  Component Value Date   WBC 7.9 11/23/2021   HGB 14.5 11/23/2021   HCT 43.3 11/23/2021   MCV 87.5 11/23/2021   PLT 185 11/23/2021   Lab Results  Component Value Date   NA 138 02/07/2022   K 4.1 02/07/2022   CL 104 02/07/2022   CO2 27 02/07/2022     Immunization status: Immunization History  Administered Date(s) Administered   Fluad Quad(high Dose 65+) 05/25/2020   Influenza,inj,Quad PF,6+ Mos 05/10/2017, 05/02/2019   Influenza-Unspecified 04/02/2014, 04/28/2021   PFIZER(Purple Top)SARS-COV-2 Vaccination 10/10/2019, 11/04/2019   Pneumococcal Conjugate-13 06/20/2017   Pneumococcal Polysaccharide-23 05/02/2019   Tdap 09/28/2015    External Records Personally Reviewed: cardiology, pcp  Assessment:  Shortness of breath likely  secondary to  Chronic systolic and diastolic heart failure, EF 35%.  GERD  Plan/Recommendations: Follow up with cardiology for heart failure management.  I do not think dyspnea is related to pulmonary etiology Chronic cough likely gerd in the setting of dietary non-adherence.     Return to Care: Return if symptoms worsen or fail to improve.   Lenice Llamas, MD [facility] [place]

## 2022-03-02 NOTE — Progress Notes (Signed)
Full PFT Performed Today  

## 2022-03-07 ENCOUNTER — Emergency Department (HOSPITAL_COMMUNITY)
Admission: EM | Admit: 2022-03-07 | Discharge: 2022-03-08 | Disposition: A | Discharge status: Home / Self Care | Payer: Medicare Other | Attending: Student | Admitting: Student

## 2022-03-07 ENCOUNTER — Emergency Department (HOSPITAL_COMMUNITY): Payer: Medicare Other

## 2022-03-07 DIAGNOSIS — Z7901 Long term (current) use of anticoagulants: Secondary | ICD-10-CM | POA: Insufficient documentation

## 2022-03-07 DIAGNOSIS — I4891 Unspecified atrial fibrillation: Secondary | ICD-10-CM | POA: Insufficient documentation

## 2022-03-07 DIAGNOSIS — E119 Type 2 diabetes mellitus without complications: Secondary | ICD-10-CM | POA: Diagnosis not present

## 2022-03-07 DIAGNOSIS — Z794 Long term (current) use of insulin: Secondary | ICD-10-CM | POA: Diagnosis not present

## 2022-03-07 DIAGNOSIS — I2510 Atherosclerotic heart disease of native coronary artery without angina pectoris: Secondary | ICD-10-CM | POA: Insufficient documentation

## 2022-03-07 DIAGNOSIS — R0789 Other chest pain: Secondary | ICD-10-CM | POA: Diagnosis not present

## 2022-03-07 DIAGNOSIS — I110 Hypertensive heart disease with heart failure: Secondary | ICD-10-CM | POA: Diagnosis not present

## 2022-03-07 DIAGNOSIS — Z20822 Contact with and (suspected) exposure to covid-19: Secondary | ICD-10-CM | POA: Insufficient documentation

## 2022-03-07 DIAGNOSIS — I5032 Chronic diastolic (congestive) heart failure: Secondary | ICD-10-CM | POA: Insufficient documentation

## 2022-03-07 DIAGNOSIS — R079 Chest pain, unspecified: Secondary | ICD-10-CM | POA: Diagnosis present

## 2022-03-07 LAB — CBC WITH DIFFERENTIAL/PLATELET
Abs Immature Granulocytes: 0.01 10*3/uL (ref 0.00–0.07)
Basophils Absolute: 0 10*3/uL (ref 0.0–0.1)
Basophils Relative: 1 %
Eosinophils Absolute: 0.2 10*3/uL (ref 0.0–0.5)
Eosinophils Relative: 3 %
HCT: 45.3 % (ref 39.0–52.0)
Hemoglobin: 14.7 g/dL (ref 13.0–17.0)
Immature Granulocytes: 0 %
Lymphocytes Relative: 38 %
Lymphs Abs: 2 10*3/uL (ref 0.7–4.0)
MCH: 29.2 pg (ref 26.0–34.0)
MCHC: 32.5 g/dL (ref 30.0–36.0)
MCV: 89.9 fL (ref 80.0–100.0)
Monocytes Absolute: 0.4 10*3/uL (ref 0.1–1.0)
Monocytes Relative: 8 %
Neutro Abs: 2.7 10*3/uL (ref 1.7–7.7)
Neutrophils Relative %: 50 %
Platelets: 243 10*3/uL (ref 150–400)
RBC: 5.04 MIL/uL (ref 4.22–5.81)
RDW: 14 % (ref 11.5–15.5)
WBC: 5.3 10*3/uL (ref 4.0–10.5)
nRBC: 0 % (ref 0.0–0.2)

## 2022-03-07 LAB — BRAIN NATRIURETIC PEPTIDE: B Natriuretic Peptide: 44.4 pg/mL (ref 0.0–100.0)

## 2022-03-07 LAB — URINALYSIS, ROUTINE W REFLEX MICROSCOPIC
Bilirubin Urine: NEGATIVE
Glucose, UA: >=500 mg/dL — AB
Ketones, ur: NEGATIVE mg/dL
Leukocytes,Ua: NEGATIVE
Nitrite: NEGATIVE
Protein, ur: NEGATIVE mg/dL
Specific Gravity, Urine: 1.028 (ref 1.005–1.030)
pH: 5 (ref 5.0–8.0)

## 2022-03-07 LAB — SARS CORONAVIRUS 2 BY RT PCR: SARS Coronavirus 2 by RT PCR: NEGATIVE

## 2022-03-07 LAB — COMPREHENSIVE METABOLIC PANEL
ALT: 19 U/L (ref 0–44)
AST: 23 U/L (ref 15–41)
Albumin: 3.7 g/dL (ref 3.5–5.0)
Alkaline Phosphatase: 74 U/L (ref 38–126)
Anion gap: 11 (ref 5–15)
BUN: 15 mg/dL (ref 8–23)
CO2: 26 mmol/L (ref 22–32)
Calcium: 9.3 mg/dL (ref 8.9–10.3)
Chloride: 104 mmol/L (ref 98–111)
Creatinine, Ser: 1.78 mg/dL — ABNORMAL HIGH (ref 0.61–1.24)
GFR, Estimated: 43 mL/min — ABNORMAL LOW (ref >60)
Glucose, Bld: 181 mg/dL — ABNORMAL HIGH (ref 70–99)
Potassium: 4.4 mmol/L (ref 3.5–5.1)
Sodium: 141 mmol/L (ref 135–145)
Total Bilirubin: 0.6 mg/dL (ref 0.3–1.2)
Total Protein: 6.7 g/dL (ref 6.5–8.1)

## 2022-03-07 LAB — TROPONIN I (HIGH SENSITIVITY)
Troponin I (High Sensitivity): 16 ng/L (ref <18)
Troponin I (High Sensitivity): 14 ng/L (ref <18)

## 2022-03-07 LAB — CBG MONITORING, ED: Glucose-Capillary: 135 mg/dL — ABNORMAL HIGH (ref 70–99)

## 2022-03-07 MED ORDER — CYCLOBENZAPRINE HCL 10 MG PO TABS
10.0000 mg | ORAL_TABLET | Freq: Once | ORAL | Status: AC
  Administered 2022-03-07: 10 mg via ORAL
  Filled 2022-03-07: qty 1

## 2022-03-07 MED ORDER — LIDOCAINE 5 % EX PTCH
1.0000 | MEDICATED_PATCH | CUTANEOUS | Status: DC
Start: 2022-03-07 — End: 2022-03-08
  Administered 2022-03-07: 1 via TRANSDERMAL
  Filled 2022-03-07: qty 1

## 2022-03-07 NOTE — ED Triage Notes (Signed)
Pt c/o SHOB, CP/ "squeezing" onset this AM, abd pain w associated nausea but denies emesis/ diarrhea. States sugar might be "up" but denies issues w insulin pump. States "everything is wrong,"

## 2022-03-07 NOTE — ED Provider Notes (Signed)
[facility] EMERGENCY DEPARTMENT Provider Note   CSN: [id number] Arrival date & time: 03/07/22  1613     History  Chief Complaint  Patient presents with   Chest Pain   Shortness of Breath   Fatigue    Calvin [name] is a 61 y.o. male.  HPI   Patient with medical history including hypertension, A-fib currently on Eliquis, diabetes, CAD, nonischemic cardiomyopathy with chronic congestive heart failure and diastolic heart failure recent EF of 35 to 40% presents with complaint of chest pain, started this morning when he woke up, its in the middle of his chest, does not radiate, describes as a sharp pain, which waxes and wanes in intensity, has remained constant since this morning, not worsened with exertion or position, and or p.o. intake, states he has slight shortness of breath but denies pleuritic chest pain, denies any leg swelling, worsening cough congestion fevers or chills.  Reviewed patient's chart currently followed by advanced heart failure clinic, recently seen Dr. Haroldine Laws had cardiac cath in November of last year which shows mild nonobstructive CAD.   Home Medications Prior to Admission medications   Medication Sig Start Date End Date Taking? Authorizing Provider  cyclobenzaprine (FLEXERIL) 10 MG tablet Take 1 tablet (10 mg total) by mouth 2 (two) times daily as needed for muscle spasms. 03/08/22  Yes Marcello Fennel, [name]  albuterol (VENTOLIN HFA) 108 (90 Base) MCG/ACT inhaler Inhale 2 puffs into the lungs every 6 (six) hours as needed for wheezing or shortness of breath. 06/30/19   Thurnell Lose, MD  amitriptyline (ELAVIL) 25 MG tablet Take 25 mg by mouth at bedtime. 08/16/21   [provider]  bisacodyl (DULCOLAX) 5 MG EC tablet Take 2 tablets (10 mg total) by mouth every other day. Take at bedtime 12/04/20   [name], MD  Blood Glucose Monitoring Suppl ([place]) w/Device KIT 1 each by Other route daily. 10/28/21    [provider]  carvedilol (COREG) 6.25 MG tablet Take 1 tablet (6.25 mg total) by mouth 2 (two) times daily with a meal. 02/07/22   [name], [name], MD  donepezil (ARICEPT) 10 MG tablet Take 1 tablet (10 mg total) by mouth at bedtime. 03/31/21   Wertman, Clarise Cruz E, [name]  ELIQUIS 5 MG TABS tablet TAKE ONE TABLET BY MOUTH EVERY MORNING and TAKE ONE TABLET BY MOUTH EVERYDAY AT BEDTIME 02/03/22   [name], [name], MD  ezetimibe (ZETIA) 10 MG tablet TAKE ONE TABLET BY MOUTH EVERY MORNING 05/13/21   [name], [name], MD  FARXIGA 10 MG TABS tablet TAKE ONE TABLET BY MOUTH EVERY MORNING 12/06/21   [name], [name], MD  gabapentin (NEURONTIN) 100 MG capsule TAKE ONE CAPSULE BY MOUTH EVERY MORNING and TAKE ONE CAPSULE BY MOUTH AT NOON and TAKE ONE CAPSULE BY MOUTH EVERYDAY AT BEDTIME 08/16/21   [name], NP  glucose blood (ONETOUCH VERIO) test strip 1 each by Other route 2 (two) times daily. And lancets 2/day 09/15/21   [name], MD  insulin degludec (TRESIBA FLEXTOUCH) 200 UNIT/ML FlexTouch Pen Inject 54 Units into the skin daily. And pen needles 1/day 09/15/21   [name], MD  Insulin Syringe-Needle U-100 (INSULIN SYRINGE .3CC/29GX1/2") 29G X 1/2" 0.3 ML MISC Inject 1 Syringe 3 (three) times daily as directed. Check blood sugar three times daily. Dx: E11.9 05/10/17   [name], DO  Lancets (ONETOUCH DELICA PLUS FIEPPI95J) [place] 10/30/19   [name], MD  losartan (COZAAR) 25 MG tablet TAKE ONE TABLET BY MOUTH ONCE DAILY 01/05/22   [name], [name], MD  nitroGLYCERIN (NITROSTAT) 0.4 MG SL tablet Place 1 tablet (0.4 mg total) under the tongue every 5 (five) minutes as needed for chest pain. 10/27/17   [name], DO  ondansetron (ZOFRAN-ODT) 4 MG disintegrating tablet Take 1 tablet (4 mg total) by mouth every 8 (eight) hours as needed for nausea or vomiting. 09/05/21   Davonna Belling, MD  pantoprazole (PROTONIX) 40 MG tablet Take 1 tablet (40 mg total) by  mouth 2 (two) times daily before a meal. 12/23/21   Spero Geralds, MD  polyethylene glycol (MIRALAX / GLYCOLAX) 17 g packet Take 17 g by mouth 2 (two) times daily. 10/06/20   [name], MD  REPATHA 140 MG/ML SOSY INJECT 1 PEN INTO THE SKIN EVERY 14 DAYS 09/08/20   [name], [name], MD  rosuvastatin (CRESTOR) 40 MG tablet Take 40 mg by mouth at bedtime. 08/16/21   [provider]  sildenafil (VIAGRA) 100 MG tablet Take 1 tablet (100 mg total) by mouth daily as needed for erectile dysfunction. 09/15/21   [name], MD  spironolactone (ALDACTONE) 25 MG tablet TAKE ONE TABLET BY MOUTH ONCE DAILY 12/14/21   [name], [name], MD  traMADol (ULTRAM) 50 MG tablet Take 50 mg by mouth every 6 (six) hours as needed for moderate pain. 11/08/21   [provider]      Allergies    Patient has no known allergies.    Review of Systems   Review of Systems  Constitutional:  Negative for chills and fever.  Respiratory:  Positive for cough and shortness of breath.   Cardiovascular:  Positive for chest pain.  Gastrointestinal:  Negative for abdominal pain.  Neurological:  Negative for headaches.    Physical Exam Updated Vital Signs BP 122/78 (BP Location: Right Arm)   Pulse 76   Temp (!) 97.4 F (36.3 C) (Oral)   Resp 16   SpO2 98%  Physical Exam Vitals and nursing note reviewed.  Constitutional:      General: He is not in acute distress.    Appearance: He is not ill-appearing.  HENT:     Head: Normocephalic and atraumatic.     Nose: No congestion.  Eyes:     Conjunctiva/sclera: Conjunctivae normal.  Cardiovascular:     Rate and Rhythm: Normal rate and regular rhythm.     Pulses: Normal pulses.     Heart sounds: No murmur heard.    No friction rub. No gallop.  Pulmonary:     Effort: No respiratory distress.     Breath sounds: No wheezing, rhonchi or rales.     Comments: Chest pain is focalized in the middle of his chest, it is localized and reproducible, is worsened  with arm movement bilaterally.  No overlying skin changes. Abdominal:     Palpations: Abdomen is soft.     Tenderness: There is no abdominal tenderness. There is no right CVA tenderness or left CVA tenderness.  Musculoskeletal:     Comments: No peripheral edema noted during my exam.  Skin:    General: Skin is warm and dry.  Neurological:     Mental Status: He is alert.  Psychiatric:        Mood and Affect: Mood normal.     ED Results / Procedures / Treatments   Labs (all labs ordered are listed, but only abnormal results are displayed) Labs Reviewed  COMPREHENSIVE  METABOLIC PANEL - Abnormal; Notable for the following components:      Result Value   Glucose, Bld 181 (*)    Creatinine, Ser 1.78 (*)    GFR, Estimated 43 (*)    All other components within normal limits  URINALYSIS, ROUTINE W REFLEX MICROSCOPIC - Abnormal; Notable for the following components:   APPearance HAZY (*)    Glucose, UA >=500 (*)    Hgb urine dipstick SMALL (*)    Bacteria, UA RARE (*)    All other components within normal limits  CBG MONITORING, ED - Abnormal; Notable for the following components:   Glucose-Capillary 135 (*)    All other components within normal limits  SARS CORONAVIRUS 2 BY RT PCR  CBC WITH DIFFERENTIAL/PLATELET  BRAIN NATRIURETIC PEPTIDE  TROPONIN I (HIGH SENSITIVITY)  TROPONIN I (HIGH SENSITIVITY)    EKG EKG Interpretation  Date/Time:  Monday March 07 2022 16:24:16 EDT Ventricular Rate:  86 PR Interval:  178 QRS Duration: 92 QT Interval:  356 QTC Calculation: 426 R Axis:   -16 Text Interpretation: Normal sinus rhythm Possible Anterior infarct , age undetermined Abnormal ECG When compared with ECG of 07-Feb-2022 12:33, PREVIOUS ECG IS PRESENT Confirmed by Regan Lemming ([id number]) on 03/07/2022 10:23:51 PM  Radiology DG Chest 2 View  Result Date: 03/07/2022 CLINICAL DATA:  Mid chest pain. Shortness of breath and lightheadedness since last week. EXAM: CHEST - 2 VIEW  COMPARISON:  05/23/2021. FINDINGS: Cardiac silhouette is normal in size and configuration. Normal mediastinal and hilar contours. Clear lungs.  No pleural effusion or pneumothorax. Skeletal structures are intact. Is are solid skin redness stuff here IMPRESSION: No active cardiopulmonary disease. Electronically Signed   By: Lajean Manes M.D.   On: 03/07/2022 16:50    Procedures Procedures    Medications Ordered in ED Medications  lidocaine (LIDODERM) 5 % 1 patch (1 patch Transdermal Patch Applied 03/07/22 2255)  cyclobenzaprine (FLEXERIL) tablet 10 mg (10 mg Oral Given 03/07/22 2258)    ED Course/ Medical Decision Making/ A&P                           Medical Decision Making Risk Prescription drug management.   This patient presents to the ED for concern of chest pain, this involves an extensive number of treatment options, and is a complaint that carries with it a high risk of complications and morbidity.  The differential diagnosis includes ACS, PE, pneumonia, CHF exacerbation, pneumonia    Additional history obtained:  Additional history obtained from N/A External records from outside source obtained and reviewed including allergy notes, recent cardiac cath   Co morbidities that complicate the patient evaluation  Hypertension, atrial fib, nonischemic cardiomyopathy  Social Determinants of Health:  N/A    Lab Tests:  I Ordered, and personally interpreted labs.  The pertinent results include: CBC unremarkable, CMP shows glucose 181 creatinine 1.7.  Be at baseline, GFR is 43, UA is unremarkable, first troponin is 16, BNP is 44   Imaging Studies ordered:  I ordered imaging studies including chest x-ray I independently visualized and interpreted imaging which showed that signs of ischemia I agree with the radiologist interpretation   Cardiac Monitoring:  The patient was maintained on a cardiac monitor.  I personally viewed and interpreted the cardiac monitored which  showed an underlying rhythm of: Signs of ischemia   Medicines ordered and prescription drug management:  I ordered medication including lidocaine patch, Flexeril I have reviewed  the patients home medicines and have made adjustments as needed  Critical Interventions:  N/A   Reevaluation:  Presents with chest pain, triage obtain lab work imaging which I personally reviewed, they are unremarkable, during my exam pain was focalized in the chest and reproducible, seems atypical of ACS, more consistent with likely muscular due to his cardiac history we will follow-up on second troponin and reassess.  Reassessed resting comfortably, states his pain has improved, he is agreement plan to discharge at this time.    Consultations Obtained:  N/A    Test Considered:  CTA of chest-this is deferred my suspicion for PE is low at this time, afebrile, nontachycardic, nontachypneic, O2 sats in the upper 90s, presentation is a 2 of etiology, he is also on anticoag's making this unlikely.    Rule out I have low suspicion for ACS/NSTEMI as history is atypical, EKG was sinus rhythm without signs of ischemia, patient had negative delta troponin, recent cardiac cath which shows no obstructive CAD, his chest pain was focalized reproducible.   Low suspicion for AAA or aortic dissection as history is atypical, patient has low risk factors.  Suspicion for acute CHF exacerbation is low at this time no evidence of peripheral edema, no notable rales on my exam, BNP is 44, there is no evidence of vascular congestion seen on chest x-ray, vital signs reassuring.  Low suspicion for systemic infection as patient is nontoxic-appearing, vital signs reassuring, no obvious source infection noted on exam.     Dispostion and problem list  After consideration of the diagnostic results and the patients response to treatment, I feel that the patent would benefit from discharge.   Atypical chest pain- likely this is  back this is more muscular in nature, will provide him with muscle relaxers, recommend over-the-counter pain medications, due to his cardiac history we will have him follow-up with cardiology for further evaluation and strict return precautions.            Final Clinical Impression(s) / ED Diagnoses Final diagnoses:  Atypical chest pain    Rx / DC Orders ED Discharge Orders          Ordered    cyclobenzaprine (FLEXERIL) 10 MG tablet  2 times daily PRN        03/08/22 0012              Marcello Fennel, [name] 03/08/22 0013    Deno Etienne, DO 03/08/22 [phone]

## 2022-03-07 NOTE — ED Notes (Signed)
PA at bedside.

## 2022-03-07 NOTE — ED Provider Triage Note (Signed)
Emergency Medicine Provider Triage Evaluation Note  Calvin [name] , a 61 y.o. male  was evaluated in triage.  Pt complains of chest pain and shortness of breath.  Also here for abdominal pain, worried his sugar is too high, nausea but no vomiting.  "Everything is wrong".  Review of Systems  Per HPI  Physical Exam  BP (!) 155/94 (BP Location: Right Arm)   Pulse 88   Temp 97.9 F (36.6 C) (Oral)   Resp 16   SpO2 100%  Gen:   Awake, no distress   Resp:  Normal effort  MSK:   Moves extremities without difficulty  Other:  Anxious affect. Abdomen soft non tender.   Medical Decision Making  Medically screening exam initiated at 4:23 PM.  Appropriate orders placed.  Calvin [name] was informed that the remainder of the evaluation will be completed by another provider, this initial triage assessment does not replace that evaluation, and the importance of remaining in the ED until their evaluation is complete.     Sherrill Raring, [name] 03/07/22 1625

## 2022-03-08 ENCOUNTER — Other Ambulatory Visit: Payer: Self-pay | Admitting: Physician Assistant

## 2022-03-08 MED ORDER — CYCLOBENZAPRINE HCL 10 MG PO TABS: 10.0000 mg | ORAL_TABLET | Freq: Two times a day (BID) | ORAL | 0 refills | Status: DC | PRN

## 2022-03-08 NOTE — Discharge Instructions (Signed)
Lab work imaging was reassuring, I have given you a muscle laxer please take as prescribed.  I would recommend following up with cardiology for further evaluation.  Come back to the emergency department if you develop chest pain, shortness of breath, severe abdominal pain, uncontrolled nausea, vomiting, diarrhea.

## 2022-03-08 NOTE — ED Notes (Signed)
Patient verbalizes understanding of discharge instructions. Opportunity for questioning and answers were provided. Armband removed by staff, pt discharged from ED via wheelchair.  

## 2022-03-18 LAB — GLUCOSE, POCT (MANUAL RESULT ENTRY): POC Glucose: 151 mg/dl — AB (ref 70–99)

## 2022-03-18 LAB — POCT GLYCOSYLATED HEMOGLOBIN (HGB A1C): Hemoglobin-A1c: 9.2

## 2022-03-22 NOTE — Progress Notes (Signed)
Assessment/Plan:   Mild Cognitive Impairment   Calvin [name] is a very pleasant 61 y.o. 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 Patient is on donepezil 10 mg daily.  MMSE today is   /30  with delayed recall  /3     Recommendations:   Follow up in   months.    Subjective:   This patient is accompanied in the office by   who supplements the history. Previous records as well as any outside records available were reviewed prior to todays visit.  *** He was last seen on 09/20/21. He is on donepezil 10 mg daily     Any changes in memory since last visit? repeats oneself?  Endorsed Disoriented when walking into a room?  Patient denies   Leaving objects in unusual places?  Patient denies   Ambulates  with difficulty?   Patient denies  *** Uses a R cane Recent falls?  Patient denies   Any head injuries?  Patient denies   History of seizures?   Patient denies   Wandering behavior?  Patient denies   Patient drives?   Patient no longer drives  Any mood changes since last visit?  Patient denies   Any worsening depression?:  Patient denies   Hallucinations?  Patient denies   "things in the room, something standing looking at me and then going away. I can't stand in the hallway, because the walls are closing. No other type of paranoia Paranoia?  Patient denies   Patient reports that sleeps well with some vivid dreams, REM behavior or sleepwalking   History of sleep apnea?  Patient denies   Any hygiene concerns?  Patient denies   Independent of bathing and dressing?  Endorsed  Does the patient needs help with medications?  Denies. Uses a pillpack Who is in charge of the finances? Patient  is in charge     Any changes in appetite?  Patient denies   Patient have trouble swallowing? Patient denies   Does the patient cook?  Patient denies   Any kitchen accidents such as leaving the stove on? Patient denies   Any headaches?  Patient denies   Double vision? Patient denies   Any focal numbness or tingling?  Patient denies   Chronic back pain Patient denies   Unilateral weakness?  Patient denies   Any tremors?  Patient denies   Any history of anosmia?  Patient denies   Any incontinence of urine?  Patient denies   Any bowel dysfunction?  Chronic constipation     Patient lives alone  History On Initial Assessment 05/09/2018: This is a pleasant 61 year old right-handed man with a history of nonischemic cardiomyopathy, paroxysmal atrial fibrillation on anticoagulation with Eliquis, hypertension, hyperlipidemia, diabetes mellitus, sleep apnea, presenting for evaluation of memory loss. He started noticing memory changes around 1-1.5 years ago. He has started to need to write down when he takes his medications or doctors appointments, otherwise he would forget them. He lives with his goddaughter but manages bills, he states he forgets them at least once a month but tries to put them in a pile as a reminder. He stopped driving 6 years ago due to vision issues. He has noticed increased irritability over the same time period, he gets ill over little things  several times a week. No hallucinations. He has poor appetite. He has a diagnosis of sleep apnea but has lost his machine several years ago. He started having heart issues in his early 26s. EF in March 2019 was 20-25%, improved to 35-40% on last echo done June 2019. He has difficulties with his glucose levels, most recent HbA1c last September 2019 was 11.9. He had an MMSE of 21/30 in December 2018 and has been taking Donepezil 31m daily without side effects.   He has had daily headaches for the past 2 years, usually with pressure over the right temporal region,  lasting 2-3 hours, then coming back. No associated nausea/vomiting, photo/phonophobia. He does not take any medications for the headaches. He gets lightheaded all the time and has a history of orthostatic hypotension. Vision is blurred without his glasses. No dysarthria/dysphagia, neck/back pain, bladder dysfunction. He has occasional constipation. He has had pain over the right side of his body from the arm, down his trunk and leg for the past 2-3 years. Both feet get numb. No anosmia. He has occasional tremors. His parents had memory issues. No history of significant head injuries or alcohol use.    I personally reviewed .MRI/MRA brain done 10/2017 which did not show any acute changes, there was minimal chronic microvascular disease     Neurocognitive exam Dr. MMelvyn Novas2/28/23 Briefly, results suggested severe impairment surrounding all aspects of learning and memory. Additional impairments were exhibited across working memory, semantic fluency, confrontation naming, and a task assessing safety and judgment. Visuospatial abilities were mildly variable but below expectation overall. Further variability was exhibited across executive functioning. The etiology for ongoing dysfunction is somewhat unclear. However, concerns for an early-onset Alzheimer's disease presentation are warranted. Across testing, Mr. RNedeauexhibited severe impairment across all aspects of memory. He was fully amnestic after a short delay across several tasks and performed very poorly across yes/no recognition trials. This suggests rapid forgetting and the presence of an evolving and already quite significant memory storage deficit, both of which are hallmark signs of Alzheimer's disease. Deficits in semantic fluency, confrontation naming, and visuospatial abilities would fall in line with the expected pattern of disease progression, which strengthens overall concerns.     Past Medical History:  Diagnosis Date   Adenoidal hypertrophy     Adrenal nodule 09/21/2020   AF (paroxysmal atrial fibrillation) 03/10/2018   AKI (acute kidney injury) 06/17/2019   Arthritis    Cataract    Mixed OU   Chronic anticoagulation 014/48/1856  Chronic systolic heart failure 031/49/7026  Coronary artery disease    a. cath in 08/2017 showing mild nonobstructive CAD with scattered 20-30% stenosis.    Diabetic neuropathy 09/26/2017   Diabetic retinopathy    NPDR OU   Essential hypertension 11/22/2015   Foot pain, bilateral 02/01/2019   Gastroesophageal reflux disease 05/10/2017   Heart attack    While living in [place]   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 EF 20-25% by echo in 09/2017 with cath showing mild CAD. b.  Last echo 12/2017 EF 35-40%, grade 2 DD.   Obesity    Onychomycosis of multiple toenails with type 2 diabetes mellitus  and peripheral neuropathy 05/10/2017   OSA on CPAP 05/10/2017   Right sided weakness 06/17/2019   Type II diabetes mellitus 11/20/2015   Unsteady gait 12/20/2018     Past Surgical History:  Procedure Laterality Date   BIOPSY  11/22/2021   Procedure: BIOPSY;  Surgeon: Irene Shipper, MD;  Location: [facility];  Service: Gastroenterology;;   COLONOSCOPY  05/13/11, 08/2017   9 adenomas   COLONOSCOPY WITH PROPOFOL N/A 11/22/2021   Procedure: COLONOSCOPY WITH PROPOFOL;  Surgeon: Irene Shipper, MD;  Location: [facility];  Service: Gastroenterology;  Laterality: N/A;   ESOPHAGOGASTRODUODENOSCOPY (EGD) WITH PROPOFOL N/A 11/22/2021   Procedure: ESOPHAGOGASTRODUODENOSCOPY (EGD) WITH PROPOFOL;  Surgeon: Irene Shipper, MD;  Location: [facility];  Service:  Gastroenterology;  Laterality: N/A;   FOREARM SURGERY     MUSCLE BIOPSY     POLYPECTOMY     POLYPECTOMY  11/22/2021   Procedure: POLYPECTOMY;  Surgeon: Irene Shipper, MD;  Location: [facility];  Service: Gastroenterology;;   RIGHT/LEFT HEART CATH AND CORONARY ANGIOGRAPHY N/A 08/25/2017   Procedure: RIGHT/LEFT HEART CATH AND CORONARY ANGIOGRAPHY;  Surgeon: [name], MD;  Location: [facility];  Service: Cardiovascular;  Laterality: N/A;   RIGHT/LEFT HEART CATH AND CORONARY ANGIOGRAPHY N/A 10/01/2019   Procedure: RIGHT/LEFT HEART CATH AND CORONARY ANGIOGRAPHY;  Surgeon: [name], MD;  Location: [facility];  Service: Cardiovascular;  Laterality: N/A;   RIGHT/LEFT HEART CATH AND CORONARY ANGIOGRAPHY N/A 04/07/2021   Procedure: RIGHT/LEFT HEART CATH AND CORONARY ANGIOGRAPHY;  Surgeon: [name], MD;  Location: [facility];  Service: Cardiovascular;  Laterality: N/A;     PREVIOUS MEDICATIONS:   CURRENT MEDICATIONS:  Outpatient Encounter Medications as of 03/23/2022  Medication Sig   albuterol (VENTOLIN HFA) 108 (90 Base) MCG/ACT inhaler Inhale 2 puffs into the lungs every 6 (six) hours as needed for wheezing or shortness of breath.   amitriptyline (ELAVIL) 25 MG tablet Take 25 mg by mouth at bedtime.   bisacodyl (DULCOLAX) 5 MG EC tablet Take 2 tablets (10 mg total) by mouth every other day. Take at bedtime   Blood Glucose Monitoring Suppl ([place]) w/Device KIT 1 each by Other route daily.   carvedilol (COREG) 6.25 MG tablet Take 1 tablet (6.25 mg total) by mouth 2 (two) times daily with a meal.   cyclobenzaprine (FLEXERIL) 10 MG tablet Take 1 tablet (10 mg total) by mouth 2 (two) times daily as needed for muscle spasms.   donepezil (ARICEPT) 10 MG tablet TAKE ONE TABLET BY MOUTH EVERYDAY AT BEDTIME   ELIQUIS 5 MG TABS tablet TAKE ONE TABLET BY MOUTH EVERY MORNING and TAKE ONE TABLET BY MOUTH EVERYDAY AT BEDTIME    ezetimibe (ZETIA) 10 MG tablet TAKE ONE TABLET BY MOUTH EVERY MORNING   FARXIGA 10 MG TABS tablet TAKE ONE TABLET BY MOUTH EVERY MORNING   gabapentin (NEURONTIN) 100 MG capsule TAKE ONE CAPSULE BY MOUTH EVERY MORNING and TAKE ONE CAPSULE BY MOUTH AT NOON and TAKE ONE CAPSULE BY MOUTH EVERYDAY AT BEDTIME   glucose blood (ONETOUCH VERIO) test strip 1 each by Other route 2 (two) times daily. And lancets 2/day   insulin degludec (TRESIBA FLEXTOUCH) 200 UNIT/ML FlexTouch Pen Inject 54 Units into the skin daily. And pen needles 1/day   Insulin Syringe-Needle U-100 (INSULIN SYRINGE .3CC/29GX1/2") 29G X 1/2" 0.3 ML MISC Inject 1 Syringe 3 (three) times daily as directed. Check blood sugar three times daily. Dx: E11.9  Lancets (ONETOUCH DELICA PLUS [id number]) MISC USE TWICE DAILY   losartan (COZAAR) 25 MG tablet TAKE ONE TABLET BY MOUTH ONCE DAILY   nitroGLYCERIN (NITROSTAT) 0.4 MG SL tablet Place 1 tablet (0.4 mg total) under the tongue every 5 (five) minutes as needed for chest pain.   ondansetron (ZOFRAN-ODT) 4 MG disintegrating tablet Take 1 tablet (4 mg total) by mouth every 8 (eight) hours as needed for nausea or vomiting.   pantoprazole (PROTONIX) 40 MG tablet Take 1 tablet (40 mg total) by mouth 2 (two) times daily before a meal.   polyethylene glycol (MIRALAX / GLYCOLAX) 17 g packet Take 17 g by mouth 2 (two) times daily.   REPATHA 140 MG/ML SOSY INJECT 1 PEN INTO THE SKIN EVERY 14 DAYS   rosuvastatin (CRESTOR) 40 MG tablet Take 40 mg by mouth at bedtime.   sildenafil (VIAGRA) 100 MG tablet Take 1 tablet (100 mg total) by mouth daily as needed for erectile dysfunction.   spironolactone (ALDACTONE) 25 MG tablet TAKE ONE TABLET BY MOUTH ONCE DAILY   traMADol (ULTRAM) 50 MG tablet Take 50 mg by mouth every 6 (six) hours as needed for moderate pain.   No facility-administered encounter medications on file as of 03/23/2022.     Objective:     PHYSICAL EXAMINATION:    VITALS:  There were no  vitals filed for this visit.  GEN:  The patient appears stated age and is in NAD. HEENT:  Normocephalic, atraumatic.   Neurological examination:  General: NAD, well-groomed, appears stated age. Orientation: The patient is alert. Oriented to person, place and date Cranial nerves: There is good facial symmetry.The speech is fluent and clear. No aphasia or dysarthria. Fund of knowledge is appropriate. Recent memory impaired and remote memory is normal.  Attention and concentration are normal.  Able to name objects and repeat phrases.  Hearing is intact to conversational tone.    Sensation: ***Sensation reduced with pin on LUE, decreased cold RUE, decreased pin and cold RLE, decreased vibration to knees B Motor: Strength is at least antigravity x4. Tremors: *** minimal resting tremors. DTR's 2/4 in UE/LE      03/31/2021    6:00 PM  Montreal Cognitive Assessment   Attention: Read list of digits (0/2) 1  Attention: Read list of letters (0/1) 1  Attention: Serial 7 subtraction starting at 100 (0/3) 3  Language: Repeat phrase (0/2) 1  Language : Fluency (0/1) 0  Abstraction (0/2) 1  Delayed Recall (0/5) 2  Orientation (0/6) 5       05/09/2018   10:00 AM 06/20/2017   11:26 AM  MMSE - Mini Mental State Exam  Orientation to time 5 5  Orientation to Place 5 5  Registration 3 3  Attention/ Calculation 4 0  Recall 2 1  Language- name 2 objects 2 2  Language- repeat 1 1  Language- follow 3 step command 3 2  Language- read & follow direction 1 1  Write a sentence 1 0  Copy design 1 1  Total score 28 21       Movement examination: Tone: There is normal tone in the UE/LE Abnormal movements:  no tremor.  No myoclonus.  No asterixis.   Coordination:  There is no decremation with RAM's. Normal finger to nose  Gait and Station: The patient has no difficulty arising out of a deep-seated chair without the use of the hands. The patient's stride length is good.  Gait is cautious and narrow.  Favors R  leg  Thank you for allowing [place] the opportunity to participate in the care of this nice patient. Please do not hesitate to contact us for any questions or concerns.   Total time spent on today's visit was *** minutes dedicated to this patient today, preparing to see patient, examining the patient, ordering tests and/or medications and counseling the patient, documenting clinical information in the EHR or other health record, independently interpreting results and communicating results to the patient/family, discussing treatment and goals, answering patient's questions and coordinating care.  Cc:  Roselee Nova, MD  Sharene Butters 03/22/2022 6:55 AM

## 2022-03-23 ENCOUNTER — Ambulatory Visit (INDEPENDENT_AMBULATORY_CARE_PROVIDER_SITE_OTHER): Payer: Medicare Other | Admitting: Physician Assistant

## 2022-03-23 ENCOUNTER — Encounter: Payer: Self-pay | Admitting: Physician Assistant

## 2022-03-23 VITALS — BP 122/73 | HR 100 | Resp 20 | Wt 180.0 lb

## 2022-03-23 DIAGNOSIS — F0670 Mild neurocognitive disorder due to known physiological condition without behavioral disturbance: Secondary | ICD-10-CM

## 2022-03-23 DIAGNOSIS — G309 Alzheimer's disease, unspecified: Secondary | ICD-10-CM | POA: Diagnosis not present

## 2022-03-23 MED ORDER — DONEPEZIL HCL 10 MG PO TABS
ORAL_TABLET | ORAL | 11 refills | Status: AC
Start: 2022-03-23 — End: ?

## 2022-03-23 MED ORDER — DONEPEZIL HCL 10 MG PO TABS: ORAL_TABLET | ORAL | 0 refills | Status: DC

## 2022-03-23 NOTE — Patient Instructions (Signed)
It was a pleasure to see you today at our office.   Recommendations:  Follow up in  6 months Continue donepezil 10 mg daily.   Recommend good control of cardiovascular risk factors. continue baby aspirin daily   Whom to call:  Memory  decline, memory medications: Call our office [phone]   For psychiatric meds, mood meds: Please have your primary care physician manage these medications.   Counseling regarding caregiver distress, including caregiver depression, anxiety and issues regarding community resources, adult day care programs, adult living facilities, or memory care questions:   Feel free to contact [place], Social Worker at [phone]   For assessment of decision of mental capacity and competency:  Call Dr. Anthoney Harada, geriatric psychiatrist at [phone]  For guidance in geriatric dementia issues please call [facility] [phone]  For guidance regarding [facility] and if placement were needed at the facility, contact Arnell Asal, Social Worker tel: [phone]  If you have any severe symptoms of a stroke, or other severe issues such as confusion,severe chills or fever, etc call 911 or go to the ER as you may need to be evaluated further         RECOMMENDATIONS FOR ALL PATIENTS WITH MEMORY PROBLEMS: 1. Continue to exercise (Recommend 30 minutes of walking everyday, or 3 hours every week) 2. Increase social interactions - continue going to [place] and enjoy social gatherings with friends and family 3. Eat healthy, avoid fried foods and eat more fruits and vegetables 4. Maintain adequate blood pressure, blood sugar, and blood cholesterol level. Reducing the risk of stroke and cardiovascular disease also helps promoting better memory. 5. Avoid stressful situations. Live a simple life and avoid aggravations. Organize your time and prepare for the next day in anticipation. 6. Sleep well, avoid any interruptions of  sleep and avoid any distractions in the bedroom that may interfere with adequate sleep quality 7. Avoid sugar, avoid sweets as there is a strong link between excessive sugar intake, diabetes, and cognitive impairment We discussed the Mediterranean diet, which has been shown to help patients reduce the risk of progressive memory disorders and reduces cardiovascular risk. This includes eating fish, eat fruits and green leafy vegetables, nuts like almonds and hazelnuts, walnuts, and also use olive oil. Avoid fast foods and fried foods as much as possible. Avoid sweets and sugar as sugar use has been linked to worsening of memory function.  There is always a concern of gradual progression of memory problems. If this is the case, then we may need to adjust level of care according to patient needs. Support, both to the patient and caregiver, should then be put into place.    FALL PRECAUTIONS: Be cautious when walking. Scan the area for obstacles that may increase the risk of trips and falls. When getting up in the mornings, sit up at the edge of the bed for a few minutes before getting out of bed. Consider elevating the bed at the head end to avoid drop of blood pressure when getting up. Walk always in a well-lit room (use night lights in the walls). Avoid area rugs or power cords from appliances in the middle of the walkways. Use a walker or a cane if necessary and consider physical therapy for balance exercise. Get your eyesight checked regularly.  FINANCIAL OVERSIGHT: Supervision, especially oversight when making financial decisions or transactions is also recommended.  HOME SAFETY: Consider the safety of the kitchen when operating appliances like stoves, microwave  oven, and blender. Consider having supervision and share cooking responsibilities until no longer able to participate in those. Accidents with firearms and other hazards in the house should be identified and addressed as well.   ABILITY TO BE  LEFT ALONE: If patient is unable to contact 911 operator, consider using LifeLine, or when the need is there, arrange for someone to stay with patients. Smoking is a fire hazard, consider supervision or cessation. Risk of wandering should be assessed by caregiver and if detected at any point, supervision and safe proof recommendations should be instituted.  MEDICATION SUPERVISION: Inability to self-administer medication needs to be constantly addressed. Implement a mechanism to ensure safe administration of the medications.   DRIVING: Regarding driving, in patients with progressive memory problems, driving will be impaired. We advise to have someone else do the driving if trouble finding directions or if minor accidents are reported. Independent driving assessment is available to determine safety of driving.   If you are interested in the driving assessment, you can contact the following:  [employer] in [place]  [place] [phone]  [place]  [facility] [phone] or [phone]

## 2022-04-01 ENCOUNTER — Ambulatory Visit: Payer: Medicare Other | Admitting: Internal Medicine

## 2022-04-11 DIAGNOSIS — Z0289 Encounter for other administrative examinations: Secondary | ICD-10-CM | POA: Diagnosis not present

## 2022-04-11 DIAGNOSIS — D8481 Immunodeficiency due to conditions classified elsewhere: Secondary | ICD-10-CM | POA: Diagnosis not present

## 2022-04-11 DIAGNOSIS — E1143 Type 2 diabetes mellitus with diabetic autonomic (poly)neuropathy: Secondary | ICD-10-CM | POA: Diagnosis not present

## 2022-04-11 DIAGNOSIS — E1169 Type 2 diabetes mellitus with other specified complication: Secondary | ICD-10-CM | POA: Diagnosis not present

## 2022-04-11 DIAGNOSIS — E785 Hyperlipidemia, unspecified: Secondary | ICD-10-CM | POA: Diagnosis not present

## 2022-05-05 ENCOUNTER — Other Ambulatory Visit: Payer: Self-pay | Admitting: Internal Medicine

## 2022-05-09 ENCOUNTER — Encounter (HOSPITAL_COMMUNITY): Payer: Self-pay

## 2022-05-09 ENCOUNTER — Ambulatory Visit (HOSPITAL_COMMUNITY)
Admission: RE | Admit: 2022-05-09 | Discharge: 2022-05-09 | Disposition: A | Discharge status: Home / Self Care | Payer: Medicare Other | Source: Ambulatory Visit | Attending: Family Medicine | Admitting: Family Medicine

## 2022-05-09 VITALS — BP 156/92 | HR 96 | Wt 192.0 lb

## 2022-05-09 DIAGNOSIS — I5042 Chronic combined systolic (congestive) and diastolic (congestive) heart failure: Secondary | ICD-10-CM | POA: Diagnosis not present

## 2022-05-09 DIAGNOSIS — E1136 Type 2 diabetes mellitus with diabetic cataract: Secondary | ICD-10-CM | POA: Insufficient documentation

## 2022-05-09 DIAGNOSIS — I2510 Atherosclerotic heart disease of native coronary artery without angina pectoris: Secondary | ICD-10-CM | POA: Diagnosis not present

## 2022-05-09 DIAGNOSIS — E1151 Type 2 diabetes mellitus with diabetic peripheral angiopathy without gangrene: Secondary | ICD-10-CM | POA: Insufficient documentation

## 2022-05-09 DIAGNOSIS — Z79899 Other long term (current) drug therapy: Secondary | ICD-10-CM | POA: Diagnosis not present

## 2022-05-09 DIAGNOSIS — I5022 Chronic systolic (congestive) heart failure: Secondary | ICD-10-CM | POA: Diagnosis not present

## 2022-05-09 DIAGNOSIS — Z794 Long term (current) use of insulin: Secondary | ICD-10-CM

## 2022-05-09 DIAGNOSIS — K219 Gastro-esophageal reflux disease without esophagitis: Secondary | ICD-10-CM | POA: Diagnosis not present

## 2022-05-09 DIAGNOSIS — I252 Old myocardial infarction: Secondary | ICD-10-CM | POA: Insufficient documentation

## 2022-05-09 DIAGNOSIS — E1142 Type 2 diabetes mellitus with diabetic polyneuropathy: Secondary | ICD-10-CM | POA: Insufficient documentation

## 2022-05-09 DIAGNOSIS — I480 Paroxysmal atrial fibrillation: Secondary | ICD-10-CM | POA: Insufficient documentation

## 2022-05-09 DIAGNOSIS — I959 Hypotension, unspecified: Secondary | ICD-10-CM | POA: Diagnosis not present

## 2022-05-09 DIAGNOSIS — E785 Hyperlipidemia, unspecified: Secondary | ICD-10-CM | POA: Insufficient documentation

## 2022-05-09 DIAGNOSIS — I428 Other cardiomyopathies: Secondary | ICD-10-CM | POA: Diagnosis not present

## 2022-05-09 DIAGNOSIS — E1140 Type 2 diabetes mellitus with diabetic neuropathy, unspecified: Secondary | ICD-10-CM

## 2022-05-09 DIAGNOSIS — I110 Hypertensive heart disease with heart failure: Secondary | ICD-10-CM | POA: Insufficient documentation

## 2022-05-09 DIAGNOSIS — I10 Essential (primary) hypertension: Secondary | ICD-10-CM | POA: Diagnosis not present

## 2022-05-09 LAB — BASIC METABOLIC PANEL
Anion gap: 7 (ref 5–15)
BUN: 21 mg/dL (ref 8–23)
CO2: 21 mmol/L — ABNORMAL LOW (ref 22–32)
Calcium: 9.2 mg/dL (ref 8.9–10.3)
Chloride: 110 mmol/L (ref 98–111)
Creatinine, Ser: 1.84 mg/dL — ABNORMAL HIGH (ref 0.61–1.24)
GFR, Estimated: 41 mL/min — ABNORMAL LOW (ref >60)
Glucose, Bld: 215 mg/dL — ABNORMAL HIGH (ref 70–99)
Potassium: 4.3 mmol/L (ref 3.5–5.1)
Sodium: 138 mmol/L (ref 135–145)

## 2022-05-09 MED ORDER — CARVEDILOL 12.5 MG PO TABS: 12.5000 mg | ORAL_TABLET | Freq: Two times a day (BID) | ORAL | 2 refills | Status: DC

## 2022-05-09 NOTE — Patient Instructions (Addendum)
Thank you for coming in today  Labs were done today, if any labs are abnormal the clinic will call you No news is good news  INCREASE Coreg to 12.5 mg 1 tablet twice daily   Your physician recommends that you schedule a follow-up appointment in:  4 months wit Dr. Haroldine Laws    Do the following things EVERYDAY: Weigh yourself in the morning before breakfast. Write it down and keep it in a log. Take your medicines as prescribed Eat low salt foods--Limit salt (sodium) to 2000 mg per day.  Stay as active as you can everyday Limit all fluids for the day to less than 2 liters  At the [facility], you and your health needs are our priority. As part of our continuing mission to provide you with exceptional heart care, we have created designated Provider Care Teams. These Care Teams include your primary Cardiologist (physician) and Advanced Practice Providers (APPs- Physician Assistants and Nurse Practitioners) who all work together to provide you with the care you need, when you need it.   You may see any of the following providers on your designated Care Team at your next follow up: Dr Glori Bickers Dr Loralie Champagne Dr. Roxana Hires, NP Lyda Jester, [place] [facility] [place], [place] Forestine Na, NP Audry Riles, PharmD   Please be sure to bring in all your medications bottles to every appointment.   If you have any questions or concerns before your next appointment please send [place] a message through [place] or call our office at [phone].    TO LEAVE A MESSAGE FOR THE NURSE SELECT OPTION 2, PLEASE LEAVE A MESSAGE INCLUDING: YOUR NAME DATE OF BIRTH CALL BACK NUMBER REASON FOR CALL**this is important as we prioritize the call backs  YOU WILL RECEIVE A CALL BACK THE SAME DAY AS LONG AS YOU CALL BEFORE 4:00 PM

## 2022-05-09 NOTE — Progress Notes (Signed)
Advanced Heart Failure Clinic Note   PCP: Roselee Nova, MD Primary Cardiologist: Dr. Angelena Form  Neurology: Dr. Delice Lesch  HF Cardiologist: Dr. Haroldine Laws   HPI Mr Bojarski is a 61 y.o. male with history of chronic systolic HF due to NICM, uncontrolled DM2, PAF, GERD, HTN, MI 2015, memory issues, and hyperlipidemia.   Admitted 2/19 with CP. Underwent LHC/RHC with mild nonobstructive CAD & EF 35-40%.  Readmitted in 10/2017 with chest pain. CTA was negative for PE.   Had sleep study 07/2018 that was normal.   Echo 7/20 EF 35-40%, mild LVH, normal RV size and function. No significant valvular dysfunction.   Seen in HF clinic 3/21 for first time/ R/L cath in 3/21 with non-obstructive CAD (LAD 6m 60d, LCX 40% diffuse, RCA 30p, 558m RHC well compensated PCWP 8 CI 4.3  Echo 02/08/21 EF 30-35% RV mild HK   Echo 8/23 EF 35-40%  CPX 8/22 with severe HF limitation.  BP rest: 110/64 BP peak: 114/60  Peak VO2: 13.3 (52% predicted peak VO2)  VE/VCO2 slope:  57  OUES: 0.95  Peak RER: 1.07  Ventilatory Threshold: 10.3 (40% predicted or measured peak VO2)  Peak RR 80  Peak Ventilation:  65.3  VE/MVV:  60%  PETCO2 at peak:  26  O2pulse:  10   (75% predicted O2pulse)   cMRI 03/17/21 with LVEF 45%, no LGE  Repeat R/LHC in  04/07/21 for severe CP and dyspnea  mild nonobstructive CAD, LVEF recovered to 55-60% by visual estimate, normal hemodynamics  Admitted 5/23 with chronic ab pain and constipation.  GI performed endoscopy which was overall unremarkable except small hiatal hernia, Colonoscopy showed inflammation and ulceration in rectum, sigmoid and descending colon suggestive of colitis.  Follow up 8/23, multiple somatic complaints that exacerbate HF symptoms. Stable NYHA II-III.   Echo (8/23) showed EF 35-40%, moderate LVH, grade I DD, RV normal.  Today he returns for HF follow up. Overall feeling fine. Occasional positional dizziness but no falls. Feels palps when he gets stressed out.  No SOB walking on flat ground. Denies abnormal bleeding, CP, edema, or PND/Orthopnea. Appetite ok. No fever or chills. He does not weigh at home. Taking all medications. Has new itchiness around groin, recently changed soaps. No GU symptoms.   Cardiac Studies - Echo (8/23) showed EF 35-40%, moderate LVH, grade I DD, RV normal  - cMRI (9/22): EF 45%, no LGE, RV okay, RVEF 47%, breathhold artifact noted  - R/LHC (10/22):  Ao = 144/69  LV = 144/9 RA = 3 RV = 30/6 PA = 26/9 (16) PCW = 10 Fick cardiac output/index = 5.8/3.1 SVR = 1370 PVR =  1.0 WU Ao sat = 95% PA sat = 73%, 74% SVC sat = 79%   Mid LAD lesion is 40% stenosed.   Prox Cx to Dist Cx lesion is 40% stenosed.   Dist RCA lesion is 30% stenosed.   Prox RCA to Mid RCA lesion is 40% stenosed.   The left ventricular ejection fraction is 55-65% by visual estimate.  - Echo (8/22): EF 30-35%, RV mildly reduced, trivial MR  - Echo (3/21): EF 40-45% RV ok.   - L/RHC (3/21): with non-obstructive CAD (LAD 4038m0d, LCX 40% diffuse, RCA 30p, 32m44mHC well compensated PCWP 8 CI 4.3  - Echo (6/19): EF 35-40% Grade II DD. Moderate global reduction in LV systolic function; moderate DD.  - Echo (3/19): EF 20-25% Grade IDD   - L/RHC (2/19)  RA 5  PA 22/11 (15)  PCWP 9  CO 6 CI 3/14  Prox RCA to Mid RCA lesion is 30% stenosed. Mid RCA to Dist RCA lesion is 30% stenosed. Prox Cx to Dist Cx lesion is 20% stenosed. Mid LAD lesion is 30% stenosed. There is mild to moderate left ventricular systolic dysfunction. LV end diastolic pressure is mildly elevated. The left ventricular ejection fraction is 35-45% by visual estimate. There is no mitral valve regurgitation.    SH: Disabled for 6 years due to diabetes. He does not smoke. Denies cocaine abuse.  Past Medical History:  Diagnosis Date   Adenoidal hypertrophy    Adrenal nodule 09/21/2020   AF (paroxysmal atrial fibrillation) 03/10/2018   AKI (acute kidney injury) 06/17/2019    Arthritis    Cataract    Mixed OU   Chronic anticoagulation 54/62/7035   Chronic systolic heart failure 00/93/8182   Coronary artery disease    a. cath in 08/2017 showing mild nonobstructive CAD with scattered 20-30% stenosis.    Diabetic neuropathy 09/26/2017   Diabetic retinopathy    NPDR OU   Essential hypertension 11/22/2015   Foot pain, bilateral 02/01/2019   Gastroesophageal reflux disease 05/10/2017   Heart attack    While living in [place]   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 EF 20-25% by echo in 09/2017 with cath showing mild CAD. b.  Last echo 12/2017 EF 35-40%, grade 2 DD.   Obesity    Onychomycosis of multiple toenails with type 2 diabetes mellitus and peripheral neuropathy 05/10/2017   OSA on CPAP 05/10/2017   Right sided weakness 06/17/2019   Type II diabetes mellitus 11/20/2015   Unsteady gait 12/20/2018    Current Outpatient Medications  Medication Sig Dispense Refill   albuterol (VENTOLIN HFA) 108 (90 Base) MCG/ACT inhaler Inhale 2 puffs into the lungs every 6 (six) hours as needed for wheezing or shortness of breath. 6.7 g 0   amitriptyline (ELAVIL) 25 MG tablet Take 25 mg by mouth at bedtime.     bisacodyl (DULCOLAX) 5 MG EC tablet Take 2 tablets (10 mg total) by mouth every other day. Take at bedtime 30 tablet 0   Blood Glucose Monitoring Suppl ([place]) w/Device KIT 1 each by Other route daily.     carvedilol (COREG) 12.5 MG tablet Take 1 tablet (12.5 mg total) by mouth 2 (two) times daily. 180 tablet 2   cyclobenzaprine (FLEXERIL) 10 MG tablet Take 1 tablet (10 mg total) by mouth 2 (two)  times daily as needed for muscle spasms. 20 tablet 0   donepezil (ARICEPT) 10 MG tablet TAKE ONE TABLET BY MOUTH EVERYDAY AT BEDTIME 30 tablet 11   ELIQUIS 5 MG TABS tablet TAKE ONE TABLET BY MOUTH EVERY MORNING and TAKE ONE TABLET BY MOUTH EVERYDAY AT BEDTIME 60 tablet 5   ezetimibe (ZETIA) 10 MG tablet TAKE ONE TABLET BY MOUTH EVERY MORNING 30 tablet 11   FARXIGA 10 MG TABS tablet TAKE ONE TABLET BY MOUTH EVERY MORNING 90 tablet 3   gabapentin (NEURONTIN) 100 MG capsule TAKE ONE CAPSULE BY MOUTH EVERY MORNING and TAKE ONE CAPSULE BY MOUTH AT NOON and TAKE ONE CAPSULE  BY MOUTH EVERYDAY AT BEDTIME 270 capsule 0   glucose blood (ONETOUCH VERIO) test strip 1 each by Other route 2 (two) times daily. And lancets 2/day 200 each 3   insulin degludec (TRESIBA FLEXTOUCH) 200 UNIT/ML FlexTouch Pen Inject 54 Units into the skin daily. And pen needles 1/day 18 mL 3   Insulin Syringe-Needle U-100 (INSULIN SYRINGE .3CC/29GX1/2") 29G X 1/2" 0.3 ML MISC Inject 1 Syringe 3 (three) times daily as directed. Check blood sugar three times daily. Dx: E11.9 100 each 3   Lancets (ONETOUCH DELICA PLUS [id number]) MISC USE TWICE DAILY 200 each 3   losartan (COZAAR) 25 MG tablet TAKE ONE TABLET BY MOUTH ONCE DAILY 30 tablet 4   pantoprazole (PROTONIX) 40 MG tablet Take 1 tablet (40 mg total) by mouth 2 (two) times daily before a meal. 60 tablet 5   polyethylene glycol (MIRALAX / GLYCOLAX) 17 g packet Take 17 g by mouth 2 (two) times daily. 14 each 0   REPATHA 140 MG/ML SOSY INJECT 1 PEN INTO THE SKIN EVERY 14 DAYS 6 mL 3   rosuvastatin (CRESTOR) 40 MG tablet Take 40 mg by mouth at bedtime.     sildenafil (VIAGRA) 100 MG tablet Take 1 tablet (100 mg total) by mouth daily as needed for erectile dysfunction. 10 tablet 11   spironolactone (ALDACTONE) 25 MG tablet TAKE ONE TABLET BY MOUTH ONCE DAILY 90 tablet 3   traMADol (ULTRAM) 50 MG tablet Take 50 mg by mouth every 6 (six) hours as needed for moderate pain.     nitroGLYCERIN  (NITROSTAT) 0.4 MG SL tablet Place 1 tablet (0.4 mg total) under the tongue every 5 (five) minutes as needed for chest pain. (Patient not taking: Reported on 05/09/2022) 30 tablet 1   ondansetron (ZOFRAN-ODT) 4 MG disintegrating tablet Take 1 tablet (4 mg total) by mouth every 8 (eight) hours as needed for nausea or vomiting. (Patient not taking: Reported on 05/09/2022) 8 tablet 0   No current facility-administered medications for this encounter.   No Known Allergies  Social History   Socioeconomic History   Marital status: Married    Spouse name: Not on file   Number of children: 0   Years of education: 13   Highest education level: Some college, no degree  Occupational History   Occupation: Disability  Tobacco Use   Smoking status: Never   Smokeless tobacco: Never  Vaping Use   Vaping Use: Never used  Substance and Sexual Activity   Alcohol use: No   Drug use: No   Sexual activity: Yes    Birth control/protection: None  Other Topics Concern   Not on file  Social History Narrative   Social History   Diet?    Do you drink/eat things with caffeine? yes   Marital status?       single      Do you live in a house, apartment, assisted living, condo, trailer, etc.? yes   Is it one or more stories? One story   How many persons live in your home?   Do you have any pets in your home? (please list)    Highest level of education completed? graduate   Do you exercise?            no                          Type & how often?   Advanced Directives   Do you have  a living will? no   Do you have a DNR form?                                  If not, do you want to discuss one? no   Do you have signed POA/HPOA for forms? no      Functional Status   Do you have difficulty bathing or dressing yourself? no   Do you have difficulty preparing food or eating? no   Do you have difficulty managing your medications? no   Do you have difficulty managing your finances? no   Do you have difficulty  affording your medications? no   Social Determinants of Health   Financial Resource Strain: Not on file  Food Insecurity: No Food Insecurity (08/25/2021)   Hunger Vital Sign    Worried About Running Out of Food in the Last Year: Never true    Ran Out of Food in the Last Year: Never true  Transportation Needs: Unknown (02/15/2021)   PRAPARE - Hydrologist (Medical): No    Lack of Transportation (Non-Medical): Not on file  Physical Activity: Not on file  Stress: Not on file  Social Connections: Not on file  Intimate Partner Violence: Not on file      Family History  Problem Relation Age of Onset   Heart disease Mother    Heart attack Mother 29   Hypertension Mother    Hyperlipidemia Mother    Diabetes Mother    Heart disease Father    Heart attack Father 28   Hypertension Father    Hyperlipidemia Father    Diabetes Father    Heart attack Sister 54   Colon cancer Neg Hx    Stomach cancer Neg Hx    Esophageal cancer Neg Hx    Rectal cancer Neg Hx    Colon polyps Neg Hx    Lung disease Neg Hx    BP (!) 156/92   Pulse 96   Wt 87.1 kg (192 lb)   SpO2 98%   BMI 31.46 kg/m   Wt Readings from Last 3 Encounters:  05/09/22 87.1 kg  03/23/22 81.6 kg  03/02/22 84 kg   PHYSICAL EXAM: General:  NAD. No resp difficulty HEENT: Normal Neck: Supple. No JVD. Carotids 2+ bilat; no bruits. No lymphadenopathy or thryomegaly appreciated. Cor: PMI nondisplaced. Regular rate & rhythm. No rubs, gallops or murmurs. Lungs: Clear Abdomen: Soft, nontender, nondistended. No hepatosplenomegaly. No bruits or masses. Good bowel sounds. Extremities: No cyanosis, clubbing, rash, edema Neuro: Alert & oriented x 3, cranial nerves grossly intact. Moves all 4 extremities w/o difficulty. Affect pleasant.   ASSESSMENT & PLAN: 1. Chronic Systolic /Diastolic Heart Failure with recovered LV function, NICM - Echo (6/19): EF 35-40%. LHC 2/219 with nonobstructive CAD.  -  Echo (7/20): EF 35-40% - Echo (3/21): EF 40-45% RV ok  - R/LHC (3/21): with non-obstructive CAD (LAD 76m 60d, LCX 40% diffuse, RCA 30p, 577m RHC well compensated PCWP 8 CI 4.3 - Echo (8/22): EF 30-35% RV mild HK  - CPX (8/22): severe HF limitation with blunted BP response, elevated VEYH/CW23lope - cMRI (9/22) LVEF 45%, no LGE, RVEF 47% - R/LHC (10/22): EF 55-60% visually, mild CAD, normal hemodynamics - Echo (8/23): EF 35-40%, RV ok. - Stable NYHA II, volume looks good today. - Increase Coreg to 12.5 mg bid. - Continue Farxiga 10 mg daily. Discussed  side effects of SGLTI2, need to watch groin itching - Continue spironolactone 25 mg daily.  - Continue losartan 25 daily (failed Entresto x 2 with low BP) - Off digoxin d/t recovery in LV function - BMET today.  2. HTN - Blood pressure elevated today. - Increase Coreg as above.  3. Type 2 diabetes mellitus - hgb A1C 9.2 (9/23) - Continue Farxiga. - No change  4. CAD:  - mild nonobstructive CAD by cath 10/22 - No CP - No ASA w/ Eliquis.  - Continue ? blocker. - On statin, repatha and zetia -> lipids managed by PCP  5. PAF - Eliquis for a/c.  - Regular on exam today. - No bleeding  Follow up in 4 months with Dr. [name] Gross, FNP-BC 05/09/22 9:26 AM

## 2022-05-10 DIAGNOSIS — L309 Dermatitis, unspecified: Secondary | ICD-10-CM | POA: Diagnosis not present

## 2022-05-10 DIAGNOSIS — Z0289 Encounter for other administrative examinations: Secondary | ICD-10-CM | POA: Diagnosis not present

## 2022-05-10 DIAGNOSIS — E11319 Type 2 diabetes mellitus with unspecified diabetic retinopathy without macular edema: Secondary | ICD-10-CM | POA: Diagnosis not present

## 2022-05-10 DIAGNOSIS — E1143 Type 2 diabetes mellitus with diabetic autonomic (poly)neuropathy: Secondary | ICD-10-CM | POA: Diagnosis not present

## 2022-05-24 ENCOUNTER — Encounter (INDEPENDENT_AMBULATORY_CARE_PROVIDER_SITE_OTHER): Payer: Medicare Other | Admitting: Ophthalmology

## 2022-06-02 ENCOUNTER — Other Ambulatory Visit (HOSPITAL_COMMUNITY): Payer: Self-pay | Admitting: Internal Medicine

## 2022-06-03 DIAGNOSIS — Z1152 Encounter for screening for COVID-19: Secondary | ICD-10-CM | POA: Diagnosis not present

## 2022-06-07 ENCOUNTER — Telehealth: Payer: Self-pay | Admitting: *Deleted

## 2022-06-07 NOTE — Telephone Encounter (Signed)
Patient calling for appointment confirmation 06/10/22

## 2022-06-10 ENCOUNTER — Ambulatory Visit: Payer: Medicare Other | Admitting: Podiatry

## 2022-06-11 ENCOUNTER — Emergency Department (HOSPITAL_COMMUNITY)
Admission: EM | Admit: 2022-06-11 | Discharge: 2022-06-12 | Disposition: A | Discharge status: Home / Self Care | Payer: Medicare Other | Attending: Emergency Medicine | Admitting: Emergency Medicine

## 2022-06-11 ENCOUNTER — Other Ambulatory Visit: Payer: Self-pay

## 2022-06-11 ENCOUNTER — Encounter (HOSPITAL_COMMUNITY): Payer: Self-pay

## 2022-06-11 ENCOUNTER — Emergency Department (HOSPITAL_COMMUNITY): Payer: Medicare Other

## 2022-06-11 DIAGNOSIS — E119 Type 2 diabetes mellitus without complications: Secondary | ICD-10-CM | POA: Diagnosis not present

## 2022-06-11 DIAGNOSIS — I4891 Unspecified atrial fibrillation: Secondary | ICD-10-CM | POA: Diagnosis not present

## 2022-06-11 DIAGNOSIS — R0602 Shortness of breath: Secondary | ICD-10-CM | POA: Insufficient documentation

## 2022-06-11 DIAGNOSIS — Z794 Long term (current) use of insulin: Secondary | ICD-10-CM | POA: Insufficient documentation

## 2022-06-11 DIAGNOSIS — Z7984 Long term (current) use of oral hypoglycemic drugs: Secondary | ICD-10-CM | POA: Diagnosis not present

## 2022-06-11 DIAGNOSIS — I5020 Unspecified systolic (congestive) heart failure: Secondary | ICD-10-CM | POA: Insufficient documentation

## 2022-06-11 DIAGNOSIS — I2510 Atherosclerotic heart disease of native coronary artery without angina pectoris: Secondary | ICD-10-CM | POA: Insufficient documentation

## 2022-06-11 DIAGNOSIS — I110 Hypertensive heart disease with heart failure: Secondary | ICD-10-CM | POA: Insufficient documentation

## 2022-06-11 DIAGNOSIS — R079 Chest pain, unspecified: Secondary | ICD-10-CM | POA: Insufficient documentation

## 2022-06-11 DIAGNOSIS — Z7901 Long term (current) use of anticoagulants: Secondary | ICD-10-CM | POA: Diagnosis not present

## 2022-06-11 DIAGNOSIS — Z79899 Other long term (current) drug therapy: Secondary | ICD-10-CM | POA: Diagnosis not present

## 2022-06-11 DIAGNOSIS — R0789 Other chest pain: Secondary | ICD-10-CM | POA: Diagnosis not present

## 2022-06-11 DIAGNOSIS — R059 Cough, unspecified: Secondary | ICD-10-CM | POA: Insufficient documentation

## 2022-06-11 DIAGNOSIS — Z20822 Contact with and (suspected) exposure to covid-19: Secondary | ICD-10-CM | POA: Insufficient documentation

## 2022-06-11 LAB — BASIC METABOLIC PANEL
Anion gap: 9 (ref 5–15)
BUN: 27 mg/dL — ABNORMAL HIGH (ref 8–23)
CO2: 23 mmol/L (ref 22–32)
Calcium: 9.3 mg/dL (ref 8.9–10.3)
Chloride: 108 mmol/L (ref 98–111)
Creatinine, Ser: 1.71 mg/dL — ABNORMAL HIGH (ref 0.61–1.24)
GFR, Estimated: 45 mL/min — ABNORMAL LOW (ref >60)
Glucose, Bld: 145 mg/dL — ABNORMAL HIGH (ref 70–99)
Potassium: 4 mmol/L (ref 3.5–5.1)
Sodium: 140 mmol/L (ref 135–145)

## 2022-06-11 LAB — RESP PANEL BY RT-PCR (RSV, FLU A&B, COVID)  RVPGX2
Influenza A by PCR: NEGATIVE
Influenza B by PCR: NEGATIVE
Resp Syncytial Virus by PCR: NEGATIVE
SARS Coronavirus 2 by RT PCR: NEGATIVE

## 2022-06-11 LAB — D-DIMER, QUANTITATIVE: D-Dimer, Quant: <0.27 ug/mL-FEU (ref 0.00–0.50)

## 2022-06-11 LAB — CBC
HCT: 45.2 % (ref 39.0–52.0)
Hemoglobin: 14.4 g/dL (ref 13.0–17.0)
MCH: 27.4 pg (ref 26.0–34.0)
MCHC: 31.9 g/dL (ref 30.0–36.0)
MCV: 85.9 fL (ref 80.0–100.0)
Platelets: 253 10*3/uL (ref 150–400)
RBC: 5.26 MIL/uL (ref 4.22–5.81)
RDW: 13.7 % (ref 11.5–15.5)
WBC: 6.5 10*3/uL (ref 4.0–10.5)
nRBC: 0 % (ref 0.0–0.2)

## 2022-06-11 LAB — TROPONIN I (HIGH SENSITIVITY): Troponin I (High Sensitivity): 20 ng/L — ABNORMAL HIGH (ref <18)

## 2022-06-11 MED ORDER — FENTANYL CITRATE PF 50 MCG/ML IJ SOSY
25.0000 ug | PREFILLED_SYRINGE | Freq: Once | INTRAMUSCULAR | Status: AC
  Administered 2022-06-11: 25 ug via INTRAVENOUS
  Filled 2022-06-11: qty 1

## 2022-06-11 NOTE — ED Provider Notes (Signed)
[facility] EMERGENCY DEPARTMENT Provider Note   CSN: [id number] Arrival date & time: 06/11/22  1953     History  Chief Complaint  Patient presents with   Chest Pain    Calvin [name] is a 61 y.o. male.   Chest Pain   61 year old male presents emergency department with complaints of cough, chest pain, shortness of breath.  Patient states his status he denies history approximately 2 to 3 hours ago when he was sitting in his recliner.  He noticed coughing episode, experiencing shortness of breath as well as right-sided chest pain.  Chest pain described as sharp/stabbing in nature without radiation.  Denies fever, chills, night sweats, abdominal pain, nausea, vomiting, urinary symptoms, change in bowel habits.  Patient with history of CAD with prior cardiac catheterization performed on March 2021 with 60% stenosis of distal LAD, 40% stenosis mid LAD, 30% stenosis of distal RCA, 50% stenosis of the mid RCA, proximal to mid RCA with 30% stenosis, proximal circumflex and distal circumflex 40% stenosis. Other past medical history of paroxysmal atrial fibrillation of which she is anticoagulated on Eliquis, hyperlipidemia, diabetes mellitus type 2, hypertension, systolic heart failure, intermittent claudication, ischemic colitis.   Home Medications Prior to Admission medications   Medication Sig Start Date End Date Taking? Authorizing Provider  amitriptyline (ELAVIL) 25 MG tablet Take 25 mg by mouth at bedtime. 08/16/21  Yes [provider]  bisacodyl (DULCOLAX) 5 MG EC tablet Take 2 tablets (10 mg total) by mouth every other day. Take at bedtime 12/04/20  Yes [name], MD  carvedilol (COREG) 12.5 MG tablet Take 1 tablet (12.5 mg total) by mouth 2 (two) times daily. 05/09/22 02/03/23 Yes Milford, Maricela Bo, FNP  donepezil (ARICEPT) 10 MG tablet TAKE ONE TABLET BY MOUTH EVERYDAY AT BEDTIME Patient taking differently: Take 10 mg by mouth at bedtime. 03/23/22  Yes  Wertman, Sara E, [name]  ELIQUIS 5 MG TABS tablet TAKE ONE TABLET BY MOUTH EVERY MORNING and TAKE ONE TABLET BY MOUTH EVERYDAY AT BEDTIME Patient taking differently: Take 5 mg by mouth 2 (two) times daily. Take 1 tablet every morning and 1 at bedtime 02/03/22  Yes [name], [name], MD  ezetimibe (ZETIA) 10 MG tablet TAKE ONE TABLET BY MOUTH EVERY MORNING Patient taking differently: Take 10 mg by mouth daily. 05/05/22  Yes [name], [name], MD  FARXIGA 10 MG TABS tablet TAKE ONE TABLET BY MOUTH EVERY MORNING 12/06/21  Yes [name], [name], MD  insulin degludec (TRESIBA FLEXTOUCH) 200 UNIT/ML FlexTouch Pen Inject 54 Units into the skin daily. And pen needles 1/day 09/15/21  Yes [name], MD  losartan (COZAAR) 25 MG tablet TAKE ONE TABLET BY MOUTH ONCE DAILY Patient taking differently: Take 25 mg by mouth daily. 06/02/22  Yes [name], [name], MD  metFORMIN (GLUCOPHAGE) 1000 MG tablet Take 1,000 mg by mouth daily. 05/13/22  Yes [provider]  pantoprazole (PROTONIX) 40 MG tablet Take 1 tablet (40 mg total) by mouth 2 (two) times daily before a meal. 12/23/21  Yes Spero Geralds, MD  polyethylene glycol (MIRALAX / GLYCOLAX) 17 g packet Take 17 g by mouth 2 (two) times daily. Patient taking differently: Take 17 g by mouth daily as needed for mild constipation. 10/06/20  Yes [name], MD  REPATHA 140 MG/ML SOSY INJECT 1 PEN INTO THE SKIN EVERY 14 DAYS Patient taking differently: 140 mg by Subconjunctival route every 14 (fourteen) days. 09/08/20  Yes [name], [name], MD  rosuvastatin (CRESTOR) 40 MG tablet Take 40 mg by mouth at bedtime. 08/16/21  Yes [provider]  sildenafil (VIAGRA) 100 MG tablet Take 1 tablet (100 mg total) by mouth daily as needed for erectile dysfunction. 09/15/21  Yes [name], MD  spironolactone (ALDACTONE) 25 MG tablet TAKE ONE TABLET BY MOUTH ONCE DAILY Patient taking differently: Take 25 mg by mouth daily. 12/14/21  Yes [name], [name], MD  Blood Glucose Monitoring Suppl ([place]) w/Device KIT 1 each by Other route daily. 10/28/21   [provider]  gabapentin (NEURONTIN) 100 MG capsule TAKE ONE CAPSULE BY MOUTH EVERY MORNING and TAKE ONE CAPSULE BY MOUTH AT NOON and TAKE ONE CAPSULE BY MOUTH EVERYDAY AT BEDTIME Patient not taking: Reported on 06/11/2022 08/16/21   [name], NP  glucose blood (ONETOUCH VERIO) test strip 1 each by Other route 2 (two) times daily. And lancets 2/day 09/15/21   [name], MD  Insulin Syringe-Needle U-100 (INSULIN SYRINGE .3CC/29GX1/2") 29G X 1/2" 0.3 ML MISC Inject 1 Syringe 3 (three) times daily as directed. Check blood sugar three times daily. Dx: E11.9 05/10/17   [name], DO  Lancets (ONETOUCH DELICA PLUS SPQZRA07M) [place] USE TWICE DAILY 10/30/19   [name], MD  nitroGLYCERIN (NITROSTAT) 0.4 MG SL tablet Place 1 tablet (0.4 mg total) under the tongue every 5 (five) minutes as needed for chest pain. Patient not taking: Reported on 05/09/2022 10/27/17   [name], DO  ondansetron (ZOFRAN-ODT) 4 MG disintegrating tablet Take 1 tablet (4 mg total) by mouth every 8 (eight) hours as needed for nausea or vomiting. Patient not taking: Reported on 05/09/2022 09/05/21   Davonna Belling, MD      Allergies    Patient has no known allergies.    Review of Systems   Review of Systems  Cardiovascular:  Positive for chest pain.  All other systems reviewed and are negative.   Physical Exam Updated Vital Signs BP (!) 146/84   Pulse 90   Temp 97.7 F (36.5 C) (Oral)   Resp (!) 21   Ht _0  (1.651 m)   Wt 87.1 kg   SpO2 99%   BMI 31.95 kg/m  Physical Exam Vitals and nursing note reviewed.  Constitutional:      General: He is not in acute distress.    Appearance: He is well-developed.  HENT:     Head: Normocephalic and atraumatic.  Eyes:     Conjunctiva/sclera: Conjunctivae normal.  Cardiovascular:     Rate and Rhythm: Normal rate and regular  rhythm.     Heart sounds: No murmur heard. Pulmonary:     Effort: Pulmonary effort is normal. No respiratory distress.     Breath sounds: Normal breath sounds. No wheezing, rhonchi or rales.  Abdominal:     Palpations: Abdomen is soft.     Tenderness: There is no abdominal tenderness. There is no guarding.  Musculoskeletal:        General: No swelling.     Cervical back: Neck supple. No rigidity or tenderness.     Right lower leg: No edema.     Left lower leg: No edema.  Skin:    General: Skin is warm and dry.     Capillary Refill: Capillary refill takes less than 2 seconds.  Neurological:     Mental Status: He is alert.  Psychiatric:        Mood and Affect: Mood normal.    ED Results / Procedures /  Treatments   Labs (all labs ordered are listed, but only abnormal results are displayed) Labs Reviewed  BASIC METABOLIC PANEL - Abnormal; Notable for the following components:      Result Value   Glucose, Bld 145 (*)    BUN 27 (*)    Creatinine, Ser 1.71 (*)    GFR, Estimated 45 (*)    All other components within normal limits  TROPONIN I (HIGH SENSITIVITY) - Abnormal; Notable for the following components:   Troponin I (High Sensitivity) 20 (*)    All other components within normal limits  RESP PANEL BY RT-PCR (RSV, FLU A&B, COVID)  RVPGX2  CBC  D-DIMER, QUANTITATIVE  TROPONIN I (HIGH SENSITIVITY)    EKG EKG Interpretation  Date/Time:  Saturday June 11 2022 20:14:48 EST Ventricular Rate:  88 PR Interval:  176 QRS Duration: 92 QT Interval:  352 QTC Calculation: 425 R Axis:   -20 Text Interpretation: Normal sinus rhythm Possible Anterior infarct , age undetermined T wave abnormality, consider lateral ischemia Abnormal ECG When compared with ECG of 07-Mar-2022 16:24, PREVIOUS ECG IS PRESENT No significant change since last tracing Confirmed by Davonna Belling [id number]) on 06/11/2022 8:29:27 PM  Radiology DG Chest 2 View  Result Date: 06/11/2022 CLINICAL DATA:   Chest pain, cough EXAM: CHEST - 2 VIEW COMPARISON:  None Available. FINDINGS: The heart size and mediastinal contours are within normal limits. Both lungs are clear. The visualized skeletal structures are unremarkable. IMPRESSION: No active cardiopulmonary disease. Electronically Signed   By: Fidela Salisbury M.D.   On: 06/11/2022 20:57    Procedures Procedures    Medications Ordered in ED Medications  fentaNYL (SUBLIMAZE) injection 25 mcg (25 mcg Intravenous Given 06/11/22 2330)    ED Course/ Medical Decision Making/ A&P                           Medical Decision Making Amount and/or Complexity of Data Reviewed Labs: ordered. Radiology: ordered.   This patient presents to the ED for concern of chest pain, this involves an extensive number of treatment options, and is a complaint that carries with it a high risk of complications and morbidity.  The differential diagnosis includes ACS, tamponade, pericarditis/myocarditis, aortic dissection, pulmonary embolism, tension pneumothorax, pneumonia malignancy, cardiomyopathy, pneumothorax, MSK,  Co morbidities that complicate the patient evaluation  See HPI   Additional history obtained:  Additional history obtained from EMR External records from outside source obtained and reviewed including hospital records   Lab Tests:  I Ordered, and personally interpreted labs.  The pertinent results include: No leukocytosis.  No evidence anemia.  Platelets normal range.  No electrolyte abnormalities noted.  Renal function at patient's baseline with creatinine 1.71, BUN of 27 and GFR 45.  Respiratory viral negative.  D-dimer negative.  Initial troponin of 20 with repeat pending; EKG concerning for sinus rhythm with no acute ischemic changes from prior EKGs performed.   Imaging Studies ordered:  I ordered imaging studies including chest x-ray I independently visualized and interpreted imaging which showed no acute cardiopulmonary I agree with the  radiologist interpretation   Cardiac Monitoring: / EKG:  The patient was maintained on a cardiac monitor.  I personally viewed and interpreted the cardiac monitored which showed an underlying rhythm of: Sinus rhythm with   Consultations Obtained:  Discussed case with attending physician Dr. Alvino Chapel who was in agreement with treatment plan going forward.  Problem List / ED Course / Critical interventions /  Medication management  Chest pain I ordered medication including fentanyl for pain  Reevaluation of the patient after these medicines showed that the patient improved I have reviewed the patients home medicines and have made adjustments as needed   Social Determinants of Health:  Denies tobacco, licit drug use   Test / Admission - Considered:  Chest pain Vitals signs significant for hypertension with blood pressure 155/88.  Recommend follow-up with primary care regarding outpatient blood pressure.. Otherwise within normal range and stable throughout visit. Laboratory/imaging studies significant for: See above Doubt ACS given negative troponin and EKG findings indicative of no acute ischemia; pending second troponin.  Doubt PE given negative D-dimer and lack of concerning findings on HPI; patient is also currently anticoagulated on Eliquis due to paroxysmal atrial fibrillation.  Doubt dissection.  Doubt pneumothorax.  Doubt pneumonia.  Patient recommended close follow-up with cardiology outpatient given cardiac history.  Discussed case with the ending physician Dr. Alvino Chapel who is in agreement with treatment plan going forward.  Treatment plan discussed with patient and he acknowledged understanding was agreeable to said plan. Awaiting second troponin.  At shift change, patient carried off to [employer].  Patient stable upon initial change.         Final Clinical Impression(s) / ED Diagnoses Final diagnoses:  Chest pain, unspecified type    Rx / DC Orders ED  Discharge Orders          Ordered    Ambulatory referral to Cardiology       Comments: If you have not heard from the Cardiology office within the next 72 hours please call [phone].   06/11/22 2352              Wilnette Kales, [place] 06/11/22 Kennedy Bucker    Davonna Belling, MD 06/12/22 [phone]

## 2022-06-11 NOTE — Discharge Instructions (Addendum)
Note the workup today was overall reassuring.  As discussed, close follow-up with your cardiologist as recommended for reevaluation of your symptoms.  Please do not hesitate to return to the emergency department for worrisome signs and symptoms we discussed.Calvin [name]

## 2022-06-11 NOTE — ED Triage Notes (Signed)
The pt is x.o chest pain and sob with coughing up yellow sputum  for 2-3 gours

## 2022-06-12 LAB — TROPONIN I (HIGH SENSITIVITY): Troponin I (High Sensitivity): 18 ng/L — ABNORMAL HIGH (ref <18)

## 2022-06-12 NOTE — ED Provider Notes (Signed)
Patient signed out to me at shift change pending repeat troponin.  Patient had an episode of chest pain earlier today that lasted about 2 to 3 hours.  It has since resolved.  He denies any symptoms now.  Initial troponin was 20, which in comparison to prior workups is similar to where he was tested before.  Repeat troponin is 18.  He remains pain-free.  He has good follow-up with cardiology.  He would prefer not to be admitted to the hospital for observation.  He states that he will call his doctor on Monday.  Discussed return precautions.   Montine Circle, [name] 06/12/22 [name]    Lajean Saver, MD 06/12/22 [phone]

## 2022-06-17 ENCOUNTER — Encounter (INDEPENDENT_AMBULATORY_CARE_PROVIDER_SITE_OTHER): Payer: Self-pay

## 2022-06-17 ENCOUNTER — Encounter (INDEPENDENT_AMBULATORY_CARE_PROVIDER_SITE_OTHER): Payer: Medicare Other | Admitting: Ophthalmology

## 2022-06-19 NOTE — Progress Notes (Deleted)
Office Visit    Patient Name: Calvin [name] Date of Encounter: 06/19/2022  Primary Care Provider:  Roselee Nova, MD Primary Cardiologist:  [name], MD Primary Electrophysiologist: None  Chief Complaint    Calvin [name] is a 61 y.o. male with PMH of CAD s/p MI in 6384 chronic systolic CHF, NICM, DM type II, PAF, HTN, GERD, HLD who presents today for post ED follow-up of chest pain.  Past Medical History    Past Medical History:  Diagnosis Date   Adenoidal hypertrophy    Adrenal nodule 09/21/2020   AF (paroxysmal atrial fibrillation) 03/10/2018   AKI (acute kidney injury) 06/17/2019   Arthritis    Cataract    Mixed OU   Chronic anticoagulation 66/59/9357   Chronic systolic heart failure 01/77/9390   Coronary artery disease    a. cath in 08/2017 showing mild nonobstructive CAD with scattered 20-30% stenosis.    Diabetic neuropathy 09/26/2017   Diabetic retinopathy    NPDR OU   Essential hypertension 11/22/2015   Foot pain, bilateral 02/01/2019   Gastroesophageal reflux disease 05/10/2017   Heart attack    While living in [place]   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 EF 20-25% by echo in 09/2017 with cath showing mild CAD. b.  Last echo 12/2017 EF 35-40%, grade 2 DD.   Obesity    Onychomycosis of multiple toenails with type 2 diabetes mellitus and peripheral neuropathy 05/10/2017   OSA on CPAP 05/10/2017   Right sided weakness 06/17/2019   Type II diabetes mellitus 11/20/2015   Unsteady gait 12/20/2018   Past Surgical History:  Procedure Laterality Date   BIOPSY  11/22/2021   Procedure:  BIOPSY;  Surgeon: Irene Shipper, MD;  Location: [facility];  Service: Gastroenterology;;   COLONOSCOPY  05/13/11, 08/2017   9 adenomas   COLONOSCOPY WITH PROPOFOL N/A 11/22/2021   Procedure: COLONOSCOPY WITH PROPOFOL;  Surgeon: Irene Shipper, MD;  Location: [facility];  Service: Gastroenterology;  Laterality: N/A;   ESOPHAGOGASTRODUODENOSCOPY (EGD) WITH PROPOFOL N/A 11/22/2021   Procedure: ESOPHAGOGASTRODUODENOSCOPY (EGD) WITH PROPOFOL;  Surgeon: Irene Shipper, MD;  Location: [facility];  Service: Gastroenterology;  Laterality: N/A;   FOREARM SURGERY     MUSCLE BIOPSY     POLYPECTOMY     POLYPECTOMY  11/22/2021   Procedure: POLYPECTOMY;  Surgeon: Irene Shipper, MD;  Location: [facility];  Service: Gastroenterology;;   RIGHT/LEFT HEART CATH AND CORONARY ANGIOGRAPHY N/A 08/25/2017   Procedure: RIGHT/LEFT HEART CATH AND CORONARY ANGIOGRAPHY;  Surgeon: [name], MD;  Location: [facility];  Service: Cardiovascular;  Laterality: N/A;   RIGHT/LEFT HEART CATH AND CORONARY ANGIOGRAPHY N/A 10/01/2019   Procedure: RIGHT/LEFT HEART CATH AND CORONARY ANGIOGRAPHY;  Surgeon: [name], MD;  Location: [facility];  Service: Cardiovascular;  Laterality: N/A;   RIGHT/LEFT HEART CATH AND CORONARY ANGIOGRAPHY N/A 04/07/2021   Procedure: RIGHT/LEFT HEART CATH AND CORONARY ANGIOGRAPHY;  Surgeon: [name], MD;  Location: [facility];  Service: Cardiovascular;  Laterality: N/A;    Allergies  No Known Allergies  History of Present Illness    Calvin [name]  is a 61 year old male with the above mention past medical history who presents today for ED follow-up of chest pain.  Calvin [name] was seen initially in 2017 when he presented with chest pain to the ED.  He reported left-sided chest pain that was sharp in nature and rated 9 out of 10.  He also endorsed dyspnea on activity with dry cough.  BNP was 88.6 and EKG noted T wave inversion in anterior leads.   POC troponins were negative and patient was started on heparin drip with reduction of chest pain to 6 out of 10.  He underwent ETT but test was stopped due to excessive fatigue and changed to Tensed with no ischemia noted.  2D echo was completed showing EF of 40% with no valve disease and possible NICM noted.  He was discharged on lisinopril and Coreg with close follow-up.  He was lost to follow-up until 2019 when he was seen inpatient by Dr. Angelena Form for complaint of chest pain that he described as every other day for 3 years.  2D echo was obtained that showed cardiomyopathy with a EF of 20-25%.  R/LHC was performed 08/2017 that revealed mild nonobstructive CAD with normal filling pressures.  EF on cath was 35-45% and cardiomyopathy felt to be nonischemic.  He was discharged with carvedilol and lisinopril with BP not allowing further titration of lisinopril or carvedilol due to dizziness and possible orthostasis.  He presented to the ED 10/2017 with chest pain with headache and found to have negative delta troponins but BNP slightly elevated at 130.9.  Chest CT showed no evidence of aneurysm or dissection and MRI of the head with no acute findings.  2D echo was repeated 12/2017 with EF of 35-40% and grade 2 DD.  He was seen for evaluation of chest pains 03/2018 and was in PAF on telemetry.  Apixaban 5 mg twice daily was added and 30-day event monitor showed no AF and Eliquis and beta-blocker continued.  He is intolerant to Entresto due to hypotension and dizziness.    In 01/2019 he was admitted for complaint of chest pain and troponins mildly elevated with 2D echo showing EF 35-40% with mild LVH and RV with normal systolic function.  He was readmitted 06/2019 with chest pain and right-sided weakness and MRI of the brain was negative for stroke but patient was positive for COVID.  He was admitted 09/2019 and underwent R/LHC with nonobstructive CAD and well compensated PCWP of 8 with cardiac index of 4.30.  He was seen  09/2020 with acute on chronic CHF and started on Lasix 40 mg daily with lisinopril changed to losartan.  He returned to the ED 09/26/2020 with right-sided chest pain and ACS was normal with GI cocktail was given and patient discharged.  He was seen in the advanced heart failure clinic 11/05/2020 and losartan was stopped and Entresto started.  He was admitted with chronic abdominal pain 5/23 and found to have possible colitis following colonoscopy.  He was seen in the ED 06/11/22 with complaint of chest pain described as sharp stabbing in nature without radiation.  BP was noted to be elevated 155/88 and troponins were negative with negative D-dimer.  Patient was recommended to follow-up with cardiology outpatient.   Since last being seen in the office patient reports***.  Patient denies chest pain, palpitations, dyspnea, PND, orthopnea, nausea, vomiting, dizziness, syncope, edema, weight gain, or early satiety.   ***Notes:  Home Medications    Current Outpatient Medications  Medication Sig  Dispense Refill   amitriptyline (ELAVIL) 25 MG tablet Take 25 mg by mouth at bedtime.     bisacodyl (DULCOLAX) 5 MG EC tablet Take 2 tablets (10 mg total) by mouth every other day. Take at bedtime 30 tablet 0   Blood Glucose Monitoring Suppl ([place]) w/Device KIT 1 each by Other route daily.     carvedilol (COREG) 12.5 MG tablet Take 1 tablet (12.5 mg total) by mouth 2 (two) times daily. 180 tablet 2   donepezil (ARICEPT) 10 MG tablet TAKE ONE TABLET BY MOUTH EVERYDAY AT BEDTIME (Patient taking differently: Take 10 mg by mouth at bedtime.) 30 tablet 11   ELIQUIS 5 MG TABS tablet TAKE ONE TABLET BY MOUTH EVERY MORNING and TAKE ONE TABLET BY MOUTH EVERYDAY AT BEDTIME (Patient taking differently: Take 5 mg by mouth 2 (two) times daily. Take 1 tablet every morning and 1 at bedtime) 60 tablet 5   ezetimibe (ZETIA) 10 MG tablet TAKE ONE TABLET BY MOUTH EVERY MORNING (Patient taking differently: Take 10 mg  by mouth daily.) 30 tablet 11   FARXIGA 10 MG TABS tablet TAKE ONE TABLET BY MOUTH EVERY MORNING 90 tablet 3   gabapentin (NEURONTIN) 100 MG capsule TAKE ONE CAPSULE BY MOUTH EVERY MORNING and TAKE ONE CAPSULE BY MOUTH AT NOON and TAKE ONE CAPSULE BY MOUTH EVERYDAY AT BEDTIME (Patient not taking: Reported on 06/11/2022) 270 capsule 0   glucose blood (ONETOUCH VERIO) test strip 1 each by Other route 2 (two) times daily. And lancets 2/day 200 each 3   insulin degludec (TRESIBA FLEXTOUCH) 200 UNIT/ML FlexTouch Pen Inject 54 Units into the skin daily. And pen needles 1/day 18 mL 3   Insulin Syringe-Needle U-100 (INSULIN SYRINGE .3CC/29GX1/2") 29G X 1/2" 0.3 ML MISC Inject 1 Syringe 3 (three) times daily as directed. Check blood sugar three times daily. Dx: E11.9 100 each 3   Lancets (ONETOUCH DELICA PLUS TDDUKG25K) MISC USE TWICE DAILY 200 each 3   losartan (COZAAR) 25 MG tablet TAKE ONE TABLET BY MOUTH ONCE DAILY (Patient taking differently: Take 25 mg by mouth daily.) 90 tablet 3   metFORMIN (GLUCOPHAGE) 1000 MG tablet Take 1,000 mg by mouth daily.     nitroGLYCERIN (NITROSTAT) 0.4 MG SL tablet Place 1 tablet (0.4 mg total) under the tongue every 5 (five) minutes as needed for chest pain. (Patient not taking: Reported on 05/09/2022) 30 tablet 1   ondansetron (ZOFRAN-ODT) 4 MG disintegrating tablet Take 1 tablet (4 mg total) by mouth every 8 (eight) hours as needed for nausea or vomiting. (Patient not taking: Reported on 05/09/2022) 8 tablet 0   pantoprazole (PROTONIX) 40 MG tablet Take 1 tablet (40 mg total) by mouth 2 (two) times daily before a meal. 60 tablet 5   polyethylene glycol (MIRALAX / GLYCOLAX) 17 g packet Take 17 g by mouth 2 (two) times daily. (Patient taking differently: Take 17 g by mouth daily as needed for mild constipation.) 14 each 0   REPATHA 140 MG/ML SOSY INJECT 1 PEN INTO THE SKIN EVERY 14 DAYS (Patient taking differently: 140 mg by Subconjunctival route every 14 (fourteen) days.) 6 mL  3   rosuvastatin (CRESTOR) 40 MG tablet Take 40 mg by mouth at bedtime.     sildenafil (VIAGRA) 100 MG tablet Take 1 tablet (100 mg total) by mouth daily as needed for erectile dysfunction. 10 tablet 11   spironolactone (ALDACTONE) 25 MG tablet TAKE ONE TABLET BY MOUTH ONCE DAILY (Patient taking differently: Take  25 mg by mouth daily.) 90 tablet 3   No current facility-administered medications for this visit.     Review of Systems  Please see the history of present illness.    (+)*** (+)***  All other systems reviewed and are otherwise negative except as noted above.  Physical Exam    Wt Readings from Last 3 Encounters:  06/11/22 192 lb 0.3 oz (87.1 kg)  05/09/22 192 lb (87.1 kg)  03/23/22 180 lb (81.6 kg)   DQ:QIWLN were no vitals filed for this visit.,There is no height or weight on file to calculate BMI.  Constitutional:      Appearance: Healthy appearance. Not in distress.  Neck:     Vascular: JVD normal.  Pulmonary:     Effort: Pulmonary effort is normal.     Breath sounds: No wheezing. No rales. Diminished in the bases Cardiovascular:     Normal rate. Regular rhythm. Normal S1. Normal S2.      Murmurs: There is no murmur.  Edema:    Peripheral edema absent.  Abdominal:     Palpations: Abdomen is soft non tender. There is no hepatomegaly.  Skin:    General: Skin is warm and dry.  Neurological:     General: No focal deficit present.     Mental Status: Alert and oriented to person, place and time.     Cranial Nerves: Cranial nerves are intact.  EKG/LABS/Other Studies Reviewed    ECG personally reviewed by me today - ***  Risk Assessment/Calculations:   {Does this patient have ATRIAL FIBRILLATION?[phone]}        Lab Results  Component Value Date   WBC 6.5 06/11/2022   HGB 14.4 06/11/2022   HCT 45.2 06/11/2022   MCV 85.9 06/11/2022   PLT 253 06/11/2022   Lab Results  Component Value Date   CREATININE 1.71 (H) 06/11/2022   BUN 27 (H) 06/11/2022    NA 140 06/11/2022   K 4.0 06/11/2022   CL 108 06/11/2022   CO2 23 06/11/2022   Lab Results  Component Value Date   ALT 19 03/07/2022   AST 23 03/07/2022   ALKPHOS 74 03/07/2022   BILITOT 0.6 03/07/2022   Lab Results  Component Value Date   CHOL 129 05/25/2020   HDL 54 05/25/2020   LDLCALC 58 05/25/2020   TRIG 85 05/25/2020   CHOLHDL 2.4 05/25/2020    Lab Results  Component Value Date   HGBA1C 9.2 03/18/2022    Assessment & Plan    1.  Chest pain: -Today patient reports***  2.  Chronic combined diastolic and systolic CHF: -Patient currently followed by advanced heart failure clinic and is*** -Current GDMT consist of carvedilol 12.5 mg twice daily, Farxiga 10 mg, losartan 25 mg, Aldactone 25 mg  3.  Nonobstructive CAD: -s/p R/LHC completed on 04/07/2021 with 40% stenosis mid LAD, proximal circumflex 40%, distal lesion, 30% and proximal RCA to mid RCA lesion 40%. -Today patient reports***  4.  Paroxysmal atrial fibrillation: -Continue Eliquis 5 mg twice daily and current dose appropriate based on***  5.  Essential hypertension: -Patient's blood pressure today was*** -Continue carvedilol 12.5 mg twice daily, spironolactone, 25 mg losartan 25 mg     Disposition: Follow-up with [name], MD or APP in *** months {Are you ordering a CV Procedure (e.g. stress test, cath, DCCV, TEE, etc)?   Press F2        :[id number]}   Medication Adjustments/Labs and Tests Ordered: Current medicines are reviewed at length  with the patient today.  Concerns regarding medicines are outlined above.   Signed, Mable Fill, Marissa Nestle, NP 06/19/2022, 5:08 PM [facility]  Note:  This document was prepared using Dragon voice recognition software and may include unintentional dictation errors.

## 2022-06-20 ENCOUNTER — Ambulatory Visit: Payer: Medicare Other | Admitting: Nurse Practitioner

## 2022-06-20 DIAGNOSIS — R079 Chest pain, unspecified: Secondary | ICD-10-CM

## 2022-06-24 DIAGNOSIS — H90A31 Mixed conductive and sensorineural hearing loss, unilateral, right ear with restricted hearing on the contralateral side: Secondary | ICD-10-CM | POA: Diagnosis not present

## 2022-06-28 NOTE — Progress Notes (Unsigned)
Office Visit    Patient Name: Calvin [name] Date of Encounter: 06/29/2022  Primary Care Provider:  Roselee Nova, MD Primary Cardiologist:  [name], MD Primary Electrophysiologist: None  Chief Complaint   Calvin [name] is a 61 y.o. male with PMH of CAD s/p MI in 1749 chronic systolic CHF, NICM, DM type II, PAF, HTN, GERD, HLD who presents today for post ED follow-up of chest pain.   Past Medical History    Past Medical History:  Diagnosis Date   Adenoidal hypertrophy    Adrenal nodule 09/21/2020   AF (paroxysmal atrial fibrillation) 03/10/2018   AKI (acute kidney injury) 06/17/2019   Arthritis    Cataract    Mixed OU   Chronic anticoagulation 44/96/7591   Chronic systolic heart failure 63/84/6659   Coronary artery disease    a. cath in 08/2017 showing mild nonobstructive CAD with scattered 20-30% stenosis.    Diabetic neuropathy 09/26/2017   Diabetic retinopathy    NPDR OU   Essential hypertension 11/22/2015   Foot pain, bilateral 02/01/2019   Gastroesophageal reflux disease 05/10/2017   Heart attack    While living in [place]   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 EF 20-25% by echo in 09/2017 with cath showing mild CAD. b.  Last echo 12/2017 EF 35-40%, grade 2 DD.   Obesity    Onychomycosis of multiple toenails with type 2 diabetes mellitus and peripheral neuropathy 05/10/2017   OSA on CPAP 05/10/2017   Right sided weakness 06/17/2019   Type II diabetes mellitus 11/20/2015   Unsteady gait 12/20/2018   Past Surgical History:  Procedure Laterality Date   BIOPSY  11/22/2021   Procedure:  BIOPSY;  Surgeon: Irene Shipper, MD;  Location: [facility];  Service: Gastroenterology;;   COLONOSCOPY  05/13/11, 08/2017   9 adenomas   COLONOSCOPY WITH PROPOFOL N/A 11/22/2021   Procedure: COLONOSCOPY WITH PROPOFOL;  Surgeon: Irene Shipper, MD;  Location: [facility];  Service: Gastroenterology;  Laterality: N/A;   ESOPHAGOGASTRODUODENOSCOPY (EGD) WITH PROPOFOL N/A 11/22/2021   Procedure: ESOPHAGOGASTRODUODENOSCOPY (EGD) WITH PROPOFOL;  Surgeon: Irene Shipper, MD;  Location: [facility];  Service: Gastroenterology;  Laterality: N/A;   FOREARM SURGERY     MUSCLE BIOPSY     POLYPECTOMY     POLYPECTOMY  11/22/2021   Procedure: POLYPECTOMY;  Surgeon: Irene Shipper, MD;  Location: [facility];  Service: Gastroenterology;;   RIGHT/LEFT HEART CATH AND CORONARY ANGIOGRAPHY N/A 08/25/2017   Procedure: RIGHT/LEFT HEART CATH AND CORONARY ANGIOGRAPHY;  Surgeon: [name], MD;  Location: [place] [facility];  Service: Cardiovascular;  Laterality: N/A;   RIGHT/LEFT HEART CATH AND CORONARY ANGIOGRAPHY N/A 10/01/2019   Procedure: RIGHT/LEFT HEART CATH AND CORONARY ANGIOGRAPHY;  Surgeon: [name], MD;  Location: [place] [facility];  Service: Cardiovascular;  Laterality: N/A;   RIGHT/LEFT HEART CATH AND CORONARY ANGIOGRAPHY N/A 04/07/2021   Procedure: RIGHT/LEFT HEART CATH AND CORONARY ANGIOGRAPHY;  Surgeon: [name], MD;  Location: [place] [facility];  Service: Cardiovascular;  Laterality: N/A;    Allergies  No Known Allergies  History of Present Illness    Calvin [name]  is a 61 year old male with the above mention past medical history who presents today for ED follow-up of chest pain.  [name] was seen initially in 2017 when he presented with chest pain to the ED.  He reported left-sided chest pain that was sharp in nature and rated 9 out of 10.  He also endorsed dyspnea on activity with dry cough.  BNP was 88.6 and EKG noted T wave inversion in anterior leads.   POC troponins were negative and patient was started on heparin drip with reduction of chest pain to 6 out of 10.  He underwent ETT but test was stopped due to excessive fatigue and changed to [place] with no ischemia noted.  2D echo was completed showing EF of 40% with no valve disease and possible NICM noted.  He was discharged on lisinopril and Coreg with close follow-up.  He was lost to follow-up until 2019 when he was seen inpatient by Dr. Angelena Form for complaint of chest pain that he described as every other day for 3 years.  2D echo was obtained that showed cardiomyopathy with a EF of 20-25%.  R/LHC was performed 08/2017 that revealed mild nonobstructive CAD with normal filling pressures.  EF on cath was 35-45% and cardiomyopathy felt to be nonischemic.  He was discharged with carvedilol and lisinopril with BP not allowing further titration of lisinopril or carvedilol due to dizziness and possible orthostasis.  He presented to the ED 10/2017 with chest pain with headache and found to have negative delta troponins but BNP slightly elevated at 130.9.  Chest CT showed no evidence of aneurysm or dissection and MRI of the head with no acute findings.  2D echo was repeated 12/2017 with EF of 35-40% and grade 2 DD.  He was seen for evaluation of chest pains 03/2018 and was in PAF on telemetry.  Apixaban 5 mg twice daily was added and 30-day event monitor showed no AF and Eliquis and beta-blocker continued.  He is intolerant to Entresto due to hypotension and dizziness.     In 01/2019 he was admitted for complaint of chest pain and troponins mildly elevated with 2D echo showing EF 35-40% with mild LVH and RV with normal systolic function.  He was readmitted 06/2019 with chest pain and right-sided weakness and MRI of the brain was negative for stroke but patient was positive for COVID.  He was admitted 09/2019 and underwent R/LHC with nonobstructive CAD and well compensated PCWP of 8 with cardiac index of 4.30.  He was  seen 09/2020 with acute on chronic CHF and started on Lasix 40 mg daily with lisinopril changed to losartan.  He returned to the ED 09/26/2020 with right-sided chest pain and ACS was normal with GI cocktail was given and patient discharged.  He was seen in the advanced heart failure clinic 11/05/2020 and losartan was stopped and Entresto started.  He was admitted with chronic abdominal pain 5/23 and found to have possible colitis following colonoscopy.  He was seen in the ED 06/11/22 with complaint of chest pain described as sharp stabbing in nature without radiation.  BP was noted to be elevated 155/88 and troponins were negative with negative D-dimer.  Patient was recommended to follow-up with cardiology outpatient.   Mr. Nakama presents today for post ED follow-up alone.  Since last being seen in the office patient reports that he is still having fleeting episodes of chest pain and also endorses dizziness and fatigue.  His blood pressure initially was 98/56  and was 100/62 on recheck.  His heart rate is 87 bpm and he reports compliance with his medications.  He denies any syncope and states that his dizziness improves with sitting down.  He is maintaining hydration and denies any dietary changes or new supplements..  Patient denies chest pain, palpitations, dyspnea, PND, orthopnea, nausea, vomiting, dizziness, syncope, edema, weight gain, or early satiety.  Home Medications    Current Outpatient Medications  Medication Sig Dispense Refill   amitriptyline (ELAVIL) 25 MG tablet Take 25 mg by mouth at bedtime.     bisacodyl (DULCOLAX) 5 MG EC tablet Take 2 tablets (10 mg total) by mouth every other day. Take at bedtime 30 tablet 0   Blood Pressure KIT Use as directed to check blood pressure twice daily. 1 kit 0   carvedilol (COREG) 12.5 MG tablet Take 1 tablet (12.5 mg total) by mouth 2 (two) times daily. 180 tablet 2   donepezil (ARICEPT) 10 MG tablet TAKE ONE TABLET BY MOUTH EVERYDAY AT BEDTIME (Patient  taking differently: Take 10 mg by mouth at bedtime.) 30 tablet 11   ELIQUIS 5 MG TABS tablet TAKE ONE TABLET BY MOUTH EVERY MORNING and TAKE ONE TABLET BY MOUTH EVERYDAY AT BEDTIME (Patient taking differently: Take 5 mg by mouth 2 (two) times daily. Take 1 tablet every morning and 1 at bedtime) 60 tablet 5   ezetimibe (ZETIA) 10 MG tablet TAKE ONE TABLET BY MOUTH EVERY MORNING (Patient taking differently: Take 10 mg by mouth daily.) 30 tablet 11   FARXIGA 10 MG TABS tablet TAKE ONE TABLET BY MOUTH EVERY MORNING 90 tablet 3   gabapentin (NEURONTIN) 100 MG capsule TAKE ONE CAPSULE BY MOUTH EVERY MORNING and TAKE ONE CAPSULE BY MOUTH AT NOON and TAKE ONE CAPSULE BY MOUTH EVERYDAY AT BEDTIME 270 capsule 0   insulin degludec (TRESIBA FLEXTOUCH) 200 UNIT/ML FlexTouch Pen Inject 54 Units into the skin daily. And pen needles 1/day 18 mL 3   Insulin Syringe-Needle U-100 (INSULIN SYRINGE .3CC/29GX1/2") 29G X 1/2" 0.3 ML MISC Inject 1 Syringe 3 (three) times daily as directed. Check blood sugar three times daily. Dx: E11.9 100 each 3   losartan (COZAAR) 25 MG tablet TAKE ONE TABLET BY MOUTH ONCE DAILY (Patient taking differently: Take 25 mg by mouth daily.) 90 tablet 3   metFORMIN (GLUCOPHAGE) 1000 MG tablet Take 1,000 mg by mouth daily.     nitroGLYCERIN (NITROSTAT) 0.4 MG SL tablet Place 1 tablet (0.4 mg total) under the tongue every 5 (five) minutes as needed for chest pain. 30 tablet 1   ondansetron (ZOFRAN-ODT) 4 MG disintegrating tablet Take 1 tablet (4 mg total) by mouth every 8 (eight) hours as needed for nausea or vomiting. 8 tablet 0   pantoprazole (PROTONIX) 40 MG tablet Take 1 tablet (40 mg total) by mouth 2 (two) times daily before a meal. 60 tablet 5   polyethylene glycol (MIRALAX / GLYCOLAX) 17 g packet Take 17 g by mouth 2 (two) times daily. (Patient taking differently: Take 17 g by mouth daily as needed for mild constipation.) 14 each 0   REPATHA 140 MG/ML SOSY INJECT 1 PEN INTO THE SKIN EVERY 14  DAYS (Patient taking differently: 140 mg by Subconjunctival route every 14 (fourteen) days.) 6 mL 3   rosuvastatin (CRESTOR) 40 MG tablet Take 40 mg by mouth at bedtime.     sildenafil (VIAGRA) 100 MG tablet Take 1 tablet (100 mg total) by mouth daily as needed for erectile dysfunction. 10 tablet  11   spironolactone (ALDACTONE) 25 MG tablet TAKE ONE TABLET BY MOUTH ONCE DAILY (Patient taking differently: Take 25 mg by mouth daily.) 90 tablet 3   Blood Glucose Monitoring Suppl ([place]) w/Device KIT 1 each by Other route daily. (Patient not taking: Reported on 06/29/2022)     glucose blood (ONETOUCH VERIO) test strip 1 each by Other route 2 (two) times daily. And lancets 2/day (Patient not taking: Reported on 06/29/2022) 200 each 3   Lancets (ONETOUCH DELICA PLUS BCWUGQ91Q) MISC USE TWICE DAILY (Patient not taking: Reported on 06/29/2022) 200 each 3   No current facility-administered medications for this visit.     Review of Systems  Please see the history of present illness.    (+) Dizziness (+) Fatigue  All other systems reviewed and are otherwise negative except as noted above.  Physical Exam    Wt Readings from Last 3 Encounters:  06/29/22 194 lb 12.8 oz (88.4 kg)  06/11/22 192 lb 0.3 oz (87.1 kg)  05/09/22 192 lb (87.1 kg)   VS: Vitals:   06/29/22 1430  BP: (!) 98/56  Pulse: 87  SpO2: 96%  ,Body mass index is 32.42 kg/m.  Constitutional:      Appearance: Healthy appearance. Not in distress.  Neck:     Vascular: JVD normal.  Pulmonary:     Effort: Pulmonary effort is normal.     Breath sounds: No wheezing. No rales. Diminished in the bases Cardiovascular:     Normal rate. Regular rhythm. Normal S1. Normal S2.      Murmurs: There is no murmur.  Edema:    Peripheral edema absent.  Abdominal:     Palpations: Abdomen is soft non tender. There is no hepatomegaly.  Skin:    General: Skin is warm and dry.  Neurological:     General: No focal deficit  present.     Mental Status: Alert and oriented to person, place and time.     Cranial Nerves: Cranial nerves are intact.  EKG/LABS/Other Studies Reviewed    ECG personally reviewed by me today -sinus rhythm with left axis deviation and TWI in leads V6 with no acute changes noted consistent with previous EKG.  Risk Assessment/Calculations:    CHA2DS2-VASc Score = 4   This indicates a 4.8% annual risk of stroke. The patient's score is based upon: CHF History: 1 HTN History: 1 Diabetes History: 1 Stroke History: 0 Vascular Disease History: 1 Age Score: 0 Gender Score: 0           Lab Results  Component Value Date   WBC 6.5 06/11/2022   HGB 14.4 06/11/2022   HCT 45.2 06/11/2022   MCV 85.9 06/11/2022   PLT 253 06/11/2022   Lab Results  Component Value Date   CREATININE 1.71 (H) 06/11/2022   BUN 27 (H) 06/11/2022   NA 140 06/11/2022   K 4.0 06/11/2022   CL 108 06/11/2022   CO2 23 06/11/2022   Lab Results  Component Value Date   ALT 19 03/07/2022   AST 23 03/07/2022   ALKPHOS 74 03/07/2022   BILITOT 0.6 03/07/2022   Lab Results  Component Value Date   CHOL 129 05/25/2020   HDL 54 05/25/2020   LDLCALC 58 05/25/2020   TRIG 85 05/25/2020   CHOLHDL 2.4 05/25/2020    Lab Results  Component Value Date   HGBA1C 9.2 03/18/2022    Assessment & Plan    1.  Chest pain: -Today patient reports fleeting  chest pain that occurs without any associated palpitations or dizziness.  - Lexiscan Myoview to rule out any ischemic changes -Patient encouraged to use as needed nitroglycerin and return precautions to the ED advised   2.  Chronic combined diastolic and systolic CHF: -Patient currently followed by advanced heart failure clinic and is euvolemic on exam today. -Current GDMT consist of carvedilol 12.5 mg twice daily, Farxiga 10 mg, losartan 25 mg, Aldactone 25 mg -Low sodium diet, fluid restriction <2L, and daily weights encouraged. Educated to contact our office for  weight gain of 2 lbs overnight or 5 lbs in one week.    3.  Nonobstructive CAD: -s/p R/LHC completed on 04/07/2021 with 40% stenosis mid LAD, proximal circumflex 40%, distal lesion, 30% and proximal RCA to mid RCA lesion 40%. -Continue GDMT with Coreg 12.5 mg twice daily, ezetimibe 10 mg daily Crestor 40 mg daily  4.  Paroxysmal atrial fibrillation: -Patient currently sinus rhythm with rate in the mid 80s -Continue Eliquis 5 mg twice daily and current dose appropriate based on patient's recent creatinine -Continue carvedilol 12.5 mg twice daily   5.  Essential hypertension: -Patient's blood pressure today was initially 98/56 and was 100/62 on recheck -Continue carvedilol 12.5 mg twice daily, spironolactone, 25 mg losartan 25 mg    6.  Presyncope: -Patient reports dizziness and presyncope that has occurred since ED visit -14-day ZIO monitor to evaluate for possible arrhythmia or pauses.  Disposition: Follow-up with [name], MD or APP in 2 months Shared Decision Making/Informed Consent The risks [chest pain, shortness of breath, cardiac arrhythmias, dizziness, blood pressure fluctuations, myocardial infarction, stroke/transient ischemic attack, nausea, vomiting, allergic reaction, radiation exposure, metallic taste sensation and life-threatening complications (estimated to be 1 in 10,000)], benefits (risk stratification, diagnosing coronary artery disease, treatment guidance) and alternatives of a nuclear stress test were discussed in detail with Mr. Umar and he agrees to proceed.   Medication Adjustments/Labs and Tests Ordered: Current medicines are reviewed at length with the patient today.  Concerns regarding medicines are outlined above.   Signed, Mable Fill, Marissa Nestle, NP 06/29/2022, 3:20 PM [facility]  Note:  This document was prepared using Dragon voice recognition software and may include unintentional dictation errors.

## 2022-06-29 ENCOUNTER — Ambulatory Visit: Payer: Medicare Other | Attending: Nurse Practitioner | Admitting: Nurse Practitioner

## 2022-06-29 ENCOUNTER — Encounter: Payer: Self-pay | Admitting: Nurse Practitioner

## 2022-06-29 ENCOUNTER — Ambulatory Visit: Payer: Medicare Other

## 2022-06-29 VITALS — BP 98/56 | HR 87 | Ht 65.0 in | Wt 194.8 lb

## 2022-06-29 DIAGNOSIS — R42 Dizziness and giddiness: Secondary | ICD-10-CM

## 2022-06-29 DIAGNOSIS — I480 Paroxysmal atrial fibrillation: Secondary | ICD-10-CM

## 2022-06-29 DIAGNOSIS — R079 Chest pain, unspecified: Secondary | ICD-10-CM | POA: Diagnosis not present

## 2022-06-29 DIAGNOSIS — I5022 Chronic systolic (congestive) heart failure: Secondary | ICD-10-CM | POA: Diagnosis not present

## 2022-06-29 DIAGNOSIS — I10 Essential (primary) hypertension: Secondary | ICD-10-CM

## 2022-06-29 MED ORDER — BLOOD PRESSURE KIT: PACK | 0 refills | Status: DC

## 2022-06-29 NOTE — Patient Instructions (Addendum)
Medication Instructions:  Your physician recommends that you continue on your current medications as directed. Please refer to the Current Medication list given to you today.  *If you need a refill on your cardiac medications before your next appointment, please call your pharmacy*  Testing/Procedures: Your physician has requested that you wear a Zio heart monitor for 14 days. This will be mailed to your home with instructions on how to apply the monitor and how to return it when finished. Please allow 2 weeks after returning the heart monitor before our office calls you with the results.   Your physician has requested that you have a lexiscan myoview. For further information please visit [email or web address]. Please follow instruction sheet, as given.   Follow-Up: At [facility], you and your health needs are our priority.  As part of our continuing mission to provide you with exceptional heart care, we have created designated Provider Care Teams.  These Care Teams include your primary Cardiologist (physician) and Advanced Practice Providers (APPs -  Physician Assistants and Nurse Practitioners) who all work together to provide you with the care you need, when you need it.  Your next appointment:   2 month(s)  The format for your next appointment:   In Person  Provider:   Ambrose Pancoast, NP      Other Instructions Nuclear Stress Test [facility] Myoview) Instructions  Please arrive 15 minutes prior to your appointment time for registration and insurance purposes.  The test will take approximately 3 to 4 hours to complete; you may bring reading material.  If someone comes with you to your appointment, they will need to remain in the main lobby due to limited space in the testing area. **If you are pregnant or breastfeeding, please notify the nuclear lab prior to your appointment**  How to prepare for your Myocardial Perfusion Test: Do not eat or drink 3 hours prior to your test,  except you may have water. Do not consume products containing caffeine (regular or decaffeinated) 12 hours prior to your test. (ex: coffee, chocolate, sodas, tea). Do bring a list of your current medications with you.  If not listed below, you may take your medications as normal. Do wear comfortable clothes (no dresses or overalls) and walking shoes, tennis shoes preferred (No heels or open toe shoes are allowed). Do NOT wear cologne, perfume, aftershave, or lotions (deodorant is allowed). If these instructions are not followed, your test will have to be rescheduled.  Please report to [address] for your test.  If you have questions or concerns about your appointment, you can call the Nuclear Lab at [phone].  If you cannot keep your appointment, please provide 24 hours notification to the Nuclear Lab, to avoid a possible $50 charge to your account.   ZIO XT- Long Term Monitor Instructions     Your physician has requested you wear a ZIO patch monitor for 14 days.  This is a single patch monitor. [employer] supplies one patch monitor per enrollment. Additional  stickers are not available. Please do not apply patch if you will be having a Nuclear Stress Test,  Echocardiogram, Cardiac CT, MRI, or Chest Xray during the period you would be wearing the  monitor. The patch cannot be worn during these tests. You cannot remove and re-apply the  ZIO XT patch monitor.  Your ZIO patch monitor will be mailed 3 day USPS to your address on file. It may take 3-5 days  to receive your monitor  after you have been enrolled.  Once you have received your monitor, please review the enclosed instructions. Your monitor  has already been registered assigning a specific monitor serial # to you.     Billing and Patient Assistance Program Information     We have supplied [employer] with any of your insurance information on file for billing purposes.  [employer] offers a sliding scale Patient  Assistance Program for patients that do not have  insurance, or whose insurance does not completely cover the cost of the ZIO monitor.  You must apply for the Patient Assistance Program to qualify for this discounted rate.  To apply, please call [employer] at [phone], select option 4, select option 2, ask to apply for  Patient Assistance Program. [name] will ask your household income, and how many people  are in your household. They will quote your out-of-pocket cost based on that information.  [employer] will also be able to set up a 81-month interest-free payment plan if needed.     Applying the monitor     Shave hair from upper left chest.  Hold abrader disc by orange tab. Rub abrader in 40 strokes over the upper left chest as  indicated in your monitor instructions.  Clean area with 4 enclosed alcohol pads. Let dry.  Apply patch as indicated in monitor instructions. Patch will be placed under collarbone on left  side of chest with arrow pointing upward.  Rub patch adhesive wings for 2 minutes. Remove white label marked "1". Remove the white  label marked "2". Rub patch adhesive wings for 2 additional minutes.  While looking in a mirror, press and release button in center of patch. A small green light will  flash 3-4 times. This will be your only indicator that the monitor has been turned on.  Do not shower for the first 24 hours. You may shower after the first 24 hours.  Press the button if you feel a symptom. You will hear a small click. Record Date, Time and  Symptom in the Patient Logbook.  When you are ready to remove the patch, follow instructions on the last 2 pages of Patient  Logbook. Stick patch monitor onto the last page of Patient Logbook.  Place Patient Logbook in the blue and white box. Use locking tab on box and tape box closed  securely. The blue and white box has prepaid postage on it. Please place it in the mailbox as  soon as possible. Your physician should have your  test results approximately 7 days after the  [facility]  Call [employer] [phone] you have questions regarding  your ZIO XT patch monitor. Call them immediately if you see an orange light blinking on your  monitor.  If your monitor falls off in less than 4 days, contact our Monitor department at [phone]  If your monitor becomes loose or falls off after 4 days call [employer] at [phone]  suggestions on securing your monitor.    Important Information About Sugar

## 2022-06-29 NOTE — Progress Notes (Unsigned)
Enrolled patient for a 14 day Zio XT monitor to be mailed to patients home   Dr Angelena Form to read

## 2022-06-30 ENCOUNTER — Telehealth (HOSPITAL_COMMUNITY): Payer: Self-pay | Admitting: *Deleted

## 2022-06-30 NOTE — Telephone Encounter (Signed)
Patient given detailed instructions per Myocardial Perfusion Study Information Sheet for the test on 07/05/2022 at 8:00. Patient notified to arrive 15 minutes early and that it is imperative to arrive on time for appointment to keep from having the test rescheduled.  If you need to cancel or reschedule your appointment, please call the office within 24 hours of your appointment. . Patient verbalized understanding.Calvin [name]

## 2022-07-05 ENCOUNTER — Ambulatory Visit (HOSPITAL_COMMUNITY): Payer: Medicare Other | Attending: Internal Medicine

## 2022-07-05 DIAGNOSIS — Z7901 Long term (current) use of anticoagulants: Secondary | ICD-10-CM | POA: Diagnosis not present

## 2022-07-05 DIAGNOSIS — D8481 Immunodeficiency due to conditions classified elsewhere: Secondary | ICD-10-CM | POA: Diagnosis not present

## 2022-07-05 DIAGNOSIS — Z0289 Encounter for other administrative examinations: Secondary | ICD-10-CM | POA: Diagnosis not present

## 2022-07-05 DIAGNOSIS — E1143 Type 2 diabetes mellitus with diabetic autonomic (poly)neuropathy: Secondary | ICD-10-CM | POA: Diagnosis not present

## 2022-07-05 DIAGNOSIS — R079 Chest pain, unspecified: Secondary | ICD-10-CM | POA: Diagnosis not present

## 2022-07-05 DIAGNOSIS — E1165 Type 2 diabetes mellitus with hyperglycemia: Secondary | ICD-10-CM | POA: Diagnosis not present

## 2022-07-05 DIAGNOSIS — N1831 Chronic kidney disease, stage 3a: Secondary | ICD-10-CM | POA: Diagnosis not present

## 2022-07-05 DIAGNOSIS — Z794 Long term (current) use of insulin: Secondary | ICD-10-CM | POA: Diagnosis not present

## 2022-07-05 DIAGNOSIS — E1169 Type 2 diabetes mellitus with other specified complication: Secondary | ICD-10-CM | POA: Diagnosis not present

## 2022-07-05 DIAGNOSIS — I25119 Atherosclerotic heart disease of native coronary artery with unspecified angina pectoris: Secondary | ICD-10-CM | POA: Diagnosis not present

## 2022-07-05 DIAGNOSIS — E11319 Type 2 diabetes mellitus with unspecified diabetic retinopathy without macular edema: Secondary | ICD-10-CM | POA: Diagnosis not present

## 2022-07-05 DIAGNOSIS — E785 Hyperlipidemia, unspecified: Secondary | ICD-10-CM | POA: Diagnosis not present

## 2022-07-05 DIAGNOSIS — I429 Cardiomyopathy, unspecified: Secondary | ICD-10-CM | POA: Diagnosis not present

## 2022-07-05 DIAGNOSIS — I480 Paroxysmal atrial fibrillation: Secondary | ICD-10-CM | POA: Diagnosis not present

## 2022-07-05 DIAGNOSIS — I700 Atherosclerosis of aorta: Secondary | ICD-10-CM | POA: Diagnosis not present

## 2022-07-05 LAB — MYOCARDIAL PERFUSION IMAGING
LV dias vol: 118 mL (ref 62–150)
LV sys vol: 81 mL
Nuc Stress EF: 31 %
Peak HR: 86 {beats}/min
Rest HR: 78 {beats}/min
Rest Nuclear Isotope Dose: 10.8 mCi
SDS: 2
SRS: 0
SSS: 2
ST Depression (mm): 0 mm
Stress Nuclear Isotope Dose: 32.6 mCi
TID: 1.04

## 2022-07-05 MED ORDER — TECHNETIUM TC 99M TETROFOSMIN IV KIT
32.6000 | PACK | Freq: Once | INTRAVENOUS | Status: AC | PRN
  Administered 2022-07-05: 32.6 via INTRAVENOUS

## 2022-07-05 MED ORDER — REGADENOSON 0.4 MG/5ML IV SOLN
0.4000 mg | Freq: Once | INTRAVENOUS | Status: AC
  Administered 2022-07-05: 0.4 mg via INTRAVENOUS

## 2022-07-05 MED ORDER — TECHNETIUM TC 99M TETROFOSMIN IV KIT
10.8000 | PACK | Freq: Once | INTRAVENOUS | Status: AC | PRN
  Administered 2022-07-05: 10.8 via INTRAVENOUS

## 2022-07-06 ENCOUNTER — Telehealth: Payer: Self-pay | Admitting: Cardiovascular Disease

## 2022-07-06 DIAGNOSIS — H90A31 Mixed conductive and sensorineural hearing loss, unilateral, right ear with restricted hearing on the contralateral side: Secondary | ICD-10-CM | POA: Diagnosis not present

## 2022-07-06 DIAGNOSIS — H6591 Unspecified nonsuppurative otitis media, right ear: Secondary | ICD-10-CM | POA: Diagnosis not present

## 2022-07-06 NOTE — Telephone Encounter (Signed)
Pt is requesting call back in regards to results.

## 2022-07-06 NOTE — Telephone Encounter (Signed)
-----   Message from Marylu Lund., NP sent at 07/05/2022  7:11 PM EST ----- Please let Calvin [name] know that his myocardial perfusion study showed no evidence of ischemia and his study was consistent with being high risk due to decreased and reduced left ventricular function.  This is reassuring that your chest discomfort is not caused by any decreased blood flow in your heart.  Please continue your current GDMT for CHF as prescribed.  Please let me know if have any additional questions.  Ambrose Pancoast, NP

## 2022-07-06 NOTE — Telephone Encounter (Signed)
Left message for patient to call back  

## 2022-07-08 NOTE — Telephone Encounter (Signed)
Patient has been notified directly; all questions, if any, were answered. Patient voiced understanding.   

## 2022-07-12 ENCOUNTER — Ambulatory Visit: Payer: Medicare Other | Admitting: Podiatry

## 2022-07-13 ENCOUNTER — Other Ambulatory Visit: Payer: Self-pay | Admitting: Internal Medicine

## 2022-07-13 DIAGNOSIS — K219 Gastro-esophageal reflux disease without esophagitis: Secondary | ICD-10-CM

## 2022-07-14 ENCOUNTER — Ambulatory Visit (INDEPENDENT_AMBULATORY_CARE_PROVIDER_SITE_OTHER): Payer: Medicare Other | Admitting: Podiatry

## 2022-07-14 ENCOUNTER — Other Ambulatory Visit (HOSPITAL_COMMUNITY): Payer: Self-pay

## 2022-07-14 VITALS — BP 136/78

## 2022-07-14 DIAGNOSIS — E1149 Type 2 diabetes mellitus with other diabetic neurological complication: Secondary | ICD-10-CM

## 2022-07-14 DIAGNOSIS — B351 Tinea unguium: Secondary | ICD-10-CM

## 2022-07-14 DIAGNOSIS — M79675 Pain in left toe(s): Secondary | ICD-10-CM | POA: Diagnosis not present

## 2022-07-14 DIAGNOSIS — M79674 Pain in right toe(s): Secondary | ICD-10-CM

## 2022-07-14 NOTE — Progress Notes (Signed)
  Subjective:  Patient ID: Calvin [name], male    DOB: [birth year],  MRN: [id number]  Chief Complaint  Patient presents with   Nail Problem    Nail trim   62 y.o. male returns for the above complaint.  Patient presents with thickened elongated dystrophic toenails x10.  No pain on palpation.  He states it hurts when ambulating.  He is diabetic with last A1c of 9.3.  He is also on Eliquis.  He would like to have them debrided down.  He has secondary complaint of bilateral lower extremity edema.  He would like to know if there is any conservative treatment options to help decrease the fluid.  He denies any other acute complaints.  Objective:   Vitals:   07/14/22 0903  BP: 136/78   Podiatric Exam: Vascular: dorsalis pedis and posterior tibial pulses are palpable bilateral. Capillary return is immediate. Temperature gradient is WNL. Skin turgor WNL  Sensorium: Normal Semmes Weinstein monofilament test. Normal tactile sensation bilaterally. Nail Exam: Pt has thick disfigured discolored nails with subungual debris noted bilateral entire nail hallux through fifth toenails Ulcer Exam: There is no evidence of ulcer or pre-ulcerative changes or infection. Orthopedic Exam: Muscle tone and strength are WNL. No limitations in general ROM. No crepitus or effusions noted. HAV  B/L.  Hammer toes 2-5  B/L. Skin: No Porokeratosis. No infection or ulcers.  Bilateral lower extremity edema 1+ pitting bilaterally generalized.  Assessment & Plan:  Patient was evaluated and treated and all questions answered.  Onychomycosis with pain  -Nails palliatively debrided as below. -Educated on self-care  Bilateral bunion/hammertoe deformities -I explained to the patient the etiology of bunions and hammertoe to deformities and various treatment options were extensively discussed.  Given the patient is a diabetic with A1c of 9.1 with excessive pressure points to both of his feet I believe he will benefit from  diabetic shoes.  He will be scheduled to see rec for diabetic shoes.  Bilateral lower extremity xerosis -I explained to the patient the etiology of xerosis and various treatment options were extensively discussed.  I explained to the patient the importance of maintaining moisturization of the skin with application of over-the-counter lotion such as Eucerin or Luciderm.  I have asked the patient to apply this twice a day.  If unable to resolve patient will benefit from prescription lotion.  Edema generalized bilateral lower extremity -I explained to patient the etiology of edema and various treatment options were extensively discussed.  I believe patient will benefit from compression socks as well as aggressive elevation.  I discussed with the patient importance of doing so therefore reducing the fluid in the lower extremity.  Patient states understanding and will obtain compression socks.   Procedure: Nail Debridement Rationale: pain  Type of Debridement: manual, sharp debridement. Instrumentation: Nail nipper, rotary burr. Number of Nails: 10  Procedures and Treatment: Consent by patient was obtained for treatment procedures. The patient understood the discussion of treatment and procedures well. All questions were answered thoroughly reviewed. Debridement of mycotic and hypertrophic toenails, 1 through 5 bilateral and clearing of subungual debris. No ulceration, no infection noted.  Return Visit-Office Procedure: Patient instructed to return to the office for a follow up visit 3 months for continued evaluation and treatment.  Boneta Lucks, DPM    No follow-ups on file.

## 2022-07-19 DIAGNOSIS — Z0289 Encounter for other administrative examinations: Secondary | ICD-10-CM | POA: Diagnosis not present

## 2022-07-19 DIAGNOSIS — E1143 Type 2 diabetes mellitus with diabetic autonomic (poly)neuropathy: Secondary | ICD-10-CM | POA: Diagnosis not present

## 2022-08-02 DIAGNOSIS — K5909 Other constipation: Secondary | ICD-10-CM | POA: Diagnosis not present

## 2022-08-02 DIAGNOSIS — Z0289 Encounter for other administrative examinations: Secondary | ICD-10-CM | POA: Diagnosis not present

## 2022-08-02 DIAGNOSIS — E1143 Type 2 diabetes mellitus with diabetic autonomic (poly)neuropathy: Secondary | ICD-10-CM | POA: Diagnosis not present

## 2022-08-11 ENCOUNTER — Other Ambulatory Visit: Payer: Self-pay | Admitting: Internal Medicine

## 2022-08-16 DIAGNOSIS — K5909 Other constipation: Secondary | ICD-10-CM | POA: Diagnosis not present

## 2022-08-16 DIAGNOSIS — E1143 Type 2 diabetes mellitus with diabetic autonomic (poly)neuropathy: Secondary | ICD-10-CM | POA: Diagnosis not present

## 2022-08-16 DIAGNOSIS — Z0289 Encounter for other administrative examinations: Secondary | ICD-10-CM | POA: Diagnosis not present

## 2022-08-16 DIAGNOSIS — Z794 Long term (current) use of insulin: Secondary | ICD-10-CM | POA: Diagnosis not present

## 2022-08-20 ENCOUNTER — Other Ambulatory Visit: Payer: Self-pay

## 2022-08-20 ENCOUNTER — Emergency Department (HOSPITAL_COMMUNITY): Payer: Medicare Other

## 2022-08-20 ENCOUNTER — Emergency Department (HOSPITAL_COMMUNITY)
Admission: EM | Admit: 2022-08-20 | Discharge: 2022-08-20 | Disposition: A | Discharge status: Home / Self Care | Payer: Medicare Other | Attending: Emergency Medicine | Admitting: Emergency Medicine

## 2022-08-20 ENCOUNTER — Encounter (HOSPITAL_COMMUNITY): Payer: Self-pay

## 2022-08-20 DIAGNOSIS — Z951 Presence of aortocoronary bypass graft: Secondary | ICD-10-CM | POA: Diagnosis not present

## 2022-08-20 DIAGNOSIS — Z7901 Long term (current) use of anticoagulants: Secondary | ICD-10-CM | POA: Insufficient documentation

## 2022-08-20 DIAGNOSIS — Z79899 Other long term (current) drug therapy: Secondary | ICD-10-CM | POA: Insufficient documentation

## 2022-08-20 DIAGNOSIS — R0789 Other chest pain: Secondary | ICD-10-CM | POA: Diagnosis not present

## 2022-08-20 DIAGNOSIS — N1831 Chronic kidney disease, stage 3a: Secondary | ICD-10-CM | POA: Diagnosis not present

## 2022-08-20 DIAGNOSIS — K429 Umbilical hernia without obstruction or gangrene: Secondary | ICD-10-CM | POA: Diagnosis not present

## 2022-08-20 DIAGNOSIS — G309 Alzheimer's disease, unspecified: Secondary | ICD-10-CM | POA: Insufficient documentation

## 2022-08-20 DIAGNOSIS — R1013 Epigastric pain: Secondary | ICD-10-CM | POA: Diagnosis not present

## 2022-08-20 DIAGNOSIS — I5022 Chronic systolic (congestive) heart failure: Secondary | ICD-10-CM | POA: Insufficient documentation

## 2022-08-20 DIAGNOSIS — Z1152 Encounter for screening for COVID-19: Secondary | ICD-10-CM | POA: Diagnosis not present

## 2022-08-20 DIAGNOSIS — R778 Other specified abnormalities of plasma proteins: Secondary | ICD-10-CM | POA: Insufficient documentation

## 2022-08-20 DIAGNOSIS — Z7984 Long term (current) use of oral hypoglycemic drugs: Secondary | ICD-10-CM | POA: Diagnosis not present

## 2022-08-20 DIAGNOSIS — R0602 Shortness of breath: Secondary | ICD-10-CM | POA: Insufficient documentation

## 2022-08-20 DIAGNOSIS — I130 Hypertensive heart and chronic kidney disease with heart failure and stage 1 through stage 4 chronic kidney disease, or unspecified chronic kidney disease: Secondary | ICD-10-CM | POA: Diagnosis not present

## 2022-08-20 DIAGNOSIS — Z794 Long term (current) use of insulin: Secondary | ICD-10-CM | POA: Diagnosis not present

## 2022-08-20 DIAGNOSIS — E1140 Type 2 diabetes mellitus with diabetic neuropathy, unspecified: Secondary | ICD-10-CM | POA: Insufficient documentation

## 2022-08-20 DIAGNOSIS — Z0389 Encounter for observation for other suspected diseases and conditions ruled out: Secondary | ICD-10-CM | POA: Diagnosis not present

## 2022-08-20 DIAGNOSIS — I480 Paroxysmal atrial fibrillation: Secondary | ICD-10-CM | POA: Insufficient documentation

## 2022-08-20 DIAGNOSIS — R079 Chest pain, unspecified: Secondary | ICD-10-CM | POA: Diagnosis not present

## 2022-08-20 DIAGNOSIS — Z8616 Personal history of COVID-19: Secondary | ICD-10-CM | POA: Diagnosis not present

## 2022-08-20 DIAGNOSIS — K219 Gastro-esophageal reflux disease without esophagitis: Secondary | ICD-10-CM

## 2022-08-20 DIAGNOSIS — R1031 Right lower quadrant pain: Secondary | ICD-10-CM | POA: Diagnosis not present

## 2022-08-20 DIAGNOSIS — I2510 Atherosclerotic heart disease of native coronary artery without angina pectoris: Secondary | ICD-10-CM | POA: Insufficient documentation

## 2022-08-20 DIAGNOSIS — N281 Cyst of kidney, acquired: Secondary | ICD-10-CM | POA: Diagnosis not present

## 2022-08-20 LAB — BASIC METABOLIC PANEL
Anion gap: 9 (ref 5–15)
BUN: 25 mg/dL — ABNORMAL HIGH (ref 8–23)
CO2: 23 mmol/L (ref 22–32)
Calcium: 9.1 mg/dL (ref 8.9–10.3)
Chloride: 99 mmol/L (ref 98–111)
Creatinine, Ser: 1.98 mg/dL — ABNORMAL HIGH (ref 0.61–1.24)
GFR, Estimated: 37 mL/min — ABNORMAL LOW (ref >60)
Glucose, Bld: 207 mg/dL — ABNORMAL HIGH (ref 70–99)
Potassium: 4.4 mmol/L (ref 3.5–5.1)
Sodium: 131 mmol/L — ABNORMAL LOW (ref 135–145)

## 2022-08-20 LAB — CBG MONITORING, ED: Glucose-Capillary: 177 mg/dL — ABNORMAL HIGH (ref 70–99)

## 2022-08-20 LAB — CBC
HCT: 46.1 % (ref 39.0–52.0)
Hemoglobin: 15.2 g/dL (ref 13.0–17.0)
MCH: 27.9 pg (ref 26.0–34.0)
MCHC: 33 g/dL (ref 30.0–36.0)
MCV: 84.6 fL (ref 80.0–100.0)
Platelets: 233 10*3/uL (ref 150–400)
RBC: 5.45 MIL/uL (ref 4.22–5.81)
RDW: 14.7 % (ref 11.5–15.5)
WBC: 5.7 10*3/uL (ref 4.0–10.5)
nRBC: 0 % (ref 0.0–0.2)

## 2022-08-20 LAB — RESP PANEL BY RT-PCR (RSV, FLU A&B, COVID)  RVPGX2
Influenza A by PCR: NEGATIVE
Influenza B by PCR: NEGATIVE
Resp Syncytial Virus by PCR: NEGATIVE
SARS Coronavirus 2 by RT PCR: NEGATIVE

## 2022-08-20 LAB — HEPATIC FUNCTION PANEL
ALT: 16 U/L (ref 0–44)
AST: 32 U/L (ref 15–41)
Albumin: 3.5 g/dL (ref 3.5–5.0)
Alkaline Phosphatase: 68 U/L (ref 38–126)
Total Bilirubin: <0.1 mg/dL — ABNORMAL LOW (ref 0.3–1.2)
Total Protein: 6.4 g/dL — ABNORMAL LOW (ref 6.5–8.1)

## 2022-08-20 LAB — TROPONIN I (HIGH SENSITIVITY)
Troponin I (High Sensitivity): 25 ng/L — ABNORMAL HIGH (ref <18)
Troponin I (High Sensitivity): 18 ng/L — ABNORMAL HIGH (ref <18)

## 2022-08-20 LAB — LIPASE, BLOOD: Lipase: 38 U/L (ref 11–51)

## 2022-08-20 MED ORDER — MAALOX MAX 400-400-40 MG/5ML PO SUSP: 15.0000 mL | Freq: Four times a day (QID) | ORAL | 0 refills | Status: DC | PRN

## 2022-08-20 MED ORDER — ALUM & MAG HYDROXIDE-SIMETH 200-200-20 MG/5ML PO SUSP
30.0000 mL | Freq: Once | ORAL | Status: AC
  Administered 2022-08-20: 30 mL via ORAL
  Filled 2022-08-20: qty 30

## 2022-08-20 MED ORDER — IOHEXOL 350 MG/ML SOLN
75.0000 mL | Freq: Once | INTRAVENOUS | Status: AC | PRN
  Administered 2022-08-20: 75 mL via INTRAVENOUS

## 2022-08-20 MED ORDER — SODIUM CHLORIDE 0.9 % IV BOLUS
1000.0000 mL | Freq: Once | INTRAVENOUS | Status: AC
  Administered 2022-08-20: 1000 mL via INTRAVENOUS

## 2022-08-20 NOTE — ED Provider Triage Note (Signed)
Emergency Medicine Provider Triage Evaluation Note  Calvin [name] , a 62 y.o. male  was evaluated in triage.  Pt complains of generalized bodyaches and chills, shortness of breath, chest pain, high blood sugar, constipation.  Patient reports that he has had chest pain and shortness of breath for the last 2 days.  Patient recently saw cardiology in December with reassuring office visit.  Patient unsure of next visit.  Patient states he saw his doctor yesterday who provided him with MiraLAX for constipation, states he did not mention chest pain to his doctor.  Patient denies history of abdominal surgeries, denies bowel obstructions, reports he still passing flatus.  Patient states chest pain is right-sided, does not radiate.  Patient denies nausea, vomiting, diarrhea, fevers.  Patient denies sore throat.  Review of Systems  Positive:  Negative:   Physical Exam  BP 104/70 (BP Location: Right Arm)   Pulse 75   Temp 97.6 F (36.4 C) (Oral)   Resp 16   SpO2 100%  Gen:   Awake, no distress   Resp:  Normal effort  MSK:   Moves extremities without difficulty  Other:  Abdomen distended.  No peripheral edema.  Regular rate rhythm.  Lung sounds clear.  Medical Decision Making  Medically screening exam initiated at 2:42 PM.  Appropriate orders placed.  Syer Verhoef was informed that the remainder of the evaluation will be completed by another provider, this initial triage assessment does not replace that evaluation, and the importance of remaining in the ED until their evaluation is complete.     Azucena Cecil, [name] 08/20/22 1446

## 2022-08-20 NOTE — Discharge Instructions (Signed)
We evaluated you for your chest and abdominal pain.  Your laboratory testing and x-rays did not show any dangerous process.  Your symptoms are most likely caused by acid reflux.  Please continue to take the pantoprazole that you are prescribed.  Lease make sure you are taking this every day to reduce your symptoms.  I have also prescribed you some Maalox which is the medication that helped you in the emergency department.  Please follow-up closely with your primary doctor.  Please return if you develop any worsening symptoms such as difficulty breathing, severe pain, fainting, lightheadedness or dizziness, vomiting, or any other concerning symptoms.

## 2022-08-20 NOTE — ED Triage Notes (Signed)
Patient here with complaint of intermittent chest pain, lightheadedness, and shortness of breath that started a few days ago and have not resolved. Patient is alert, oriented ,speaking in complete sentences, and is in no apparent distress at this time.

## 2022-08-20 NOTE — ED Provider Notes (Signed)
[facility] Provider Note  CSN: [id number] Arrival date & time: 08/20/22 1408  Chief Complaint(s) Chest Pain  HPI Calvin [name] is a 62 y.o. male with history of A-fib, CHF, nonobstructive coronary artery disease, diabetes presenting the emergency department with abdominal pain, chest pain.  Patient reports that he has had abdominal pain for the past 2 days, located in his epigastric region and his right lower quadrant.  He also reports that the pain radiates up into his chest.  He reports a sensation of constipation, nausea and vomiting.  No fevers or chills.  No cough.  Chest pain is not exertional, not pleuritic.  He reports occasionally feeling shortness of breath.  No hematemesis, melena, hematochezia.   Past Medical History Past Medical History:  Diagnosis Date   Adenoidal hypertrophy    Adrenal nodule 09/21/2020   AF (paroxysmal atrial fibrillation) 03/10/2018   AKI (acute kidney injury) 06/17/2019   Arthritis    Cataract    Mixed OU   Chronic anticoagulation 99991111   Chronic systolic heart failure [id number]   Coronary artery disease    a. cath in 08/2017 showing mild nonobstructive CAD with scattered 20-30% stenosis.    Diabetic neuropathy 09/26/2017   Diabetic retinopathy    NPDR OU   Essential hypertension 11/22/2015   Foot pain, bilateral 02/01/2019   Gastroesophageal reflux disease 05/10/2017   Heart attack    [place] living in [place]   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 EF 20-25% by echo in 09/2017 with cath showing mild CAD. b.  Last echo 12/2017 EF 35-40%, grade  2 DD.   Obesity    Onychomycosis of multiple toenails with type 2 diabetes mellitus and peripheral neuropathy 05/10/2017   OSA on CPAP 05/10/2017   Right sided weakness 06/17/2019   Type II diabetes mellitus 11/20/2015   Unsteady gait 12/20/2018   Patient Active Problem List   Diagnosis Date Noted   Ischemic colitis ([place])    Benign neoplasm of ascending colon    Benign neoplasm of transverse colon    Left sided colitis without complications ([place])    Colonic ulcer    Nausea and vomiting    Rectal bleeding    Persistent vomiting    Constipation 11/19/2021   Stage 3a chronic kidney disease (CKD) ([place]) 11/19/2021   ED (erectile dysfunction) 09/15/2021   Mild neurocognitive disorder due to Alzheimer's disease, possible 08/24/2021   Diabetic retinopathy 08/24/2021   Heart attack 08/24/2021   Nonischemic cardiomyopathy 08/24/2021   Coronary artery disease 08/24/2021   Adrenal nodule 09/21/2020   Intermittent claudication 05/25/2020   Hypoglycemia due to insulin 10/15/2019   Chronic anticoagulation 10/15/2019   Right sided weakness 06/17/2019   Foot pain, bilateral 02/01/2019   Unsteady gait 12/20/2018   AF (paroxysmal atrial fibrillation) 03/10/2018   Diabetic neuropathy 09/26/2017   OSA on CPAP 05/10/2017   Gastroesophageal reflux disease 05/10/2017   Insomnia 05/10/2017   Onychomycosis of multiple toenails with type 2 diabetes mellitus and peripheral neuropathy [id number]   Chronic systolic heart failure 123456   Essential hypertension 11/22/2015   Poorly controlled type 2 diabetes mellitus with circulatory disorder ([place])  11/20/2015   Hyperlipidemia 11/20/2015   History of colonic polyps 05/19/2011   Home Medication(s) Prior to Admission medications   Medication Sig Start Date End Date Taking? Authorizing Provider  alum & mag hydroxide-simeth (MAALOX MAX) 400-400-40 MG/5ML suspension Take 15 mLs by mouth every 6 (six) hours as needed for indigestion. 08/20/22  Yes  Cristie Hem, MD  amitriptyline (ELAVIL) 25 MG tablet Take 25 mg by mouth at bedtime. 08/16/21   [provider]  bisacodyl (DULCOLAX) 5 MG EC tablet Take 2 tablets (10 mg total) by mouth every other day. Take at bedtime 12/04/20   [name], MD  Blood Glucose Monitoring Suppl ([employer]) w/Device KIT 1 each by Other route daily. Patient not taking: Reported on 06/29/2022 10/28/21   [provider]  Blood Pressure KIT Use as directed to check blood pressure twice daily. 06/29/22   Marylu Lund., NP  carvedilol (COREG) 12.5 MG tablet Take 1 tablet (12.5 mg total) by mouth 2 (two) times daily. 05/09/22 02/03/23  Rafael Bihari, FNP  donepezil (ARICEPT) 10 MG tablet TAKE ONE TABLET BY MOUTH EVERYDAY AT BEDTIME Patient taking differently: Take 10 mg by mouth at bedtime. 03/23/22   Wertman, Sara E, [name]  ELIQUIS 5 MG TABS tablet TAKE ONE TABLET BY MOUTH EVERY MORNING and TAKE ONE TABLET BY MOUTH EVERYDAY AT BEDTIME 08/11/22   [name], [name], MD  ezetimibe (ZETIA) 10 MG tablet TAKE ONE TABLET BY MOUTH EVERY MORNING Patient taking differently: Take 10 mg by mouth daily. 05/05/22   [name], [name], MD  FARXIGA 10 MG TABS tablet TAKE ONE TABLET BY MOUTH EVERY MORNING 12/06/21   [name], [name], MD  gabapentin (NEURONTIN) 100 MG capsule TAKE ONE CAPSULE BY MOUTH EVERY MORNING and TAKE ONE CAPSULE BY MOUTH AT NOON and TAKE ONE CAPSULE BY MOUTH EVERYDAY AT BEDTIME 08/16/21   [name], NP  glucose blood (ONETOUCH VERIO) test strip 1 each by Other route 2 (two) times daily. And lancets 2/day Patient not taking: Reported on 06/29/2022 09/15/21   [name], MD  insulin degludec (TRESIBA FLEXTOUCH) 200 UNIT/ML FlexTouch Pen Inject 54 Units into the skin daily. And pen needles 1/day 09/15/21   [name], MD  Insulin Syringe-Needle U-100 (INSULIN SYRINGE .3CC/29GX1/2") 29G X 1/2" 0.3 ML MISC Inject 1 Syringe 3 (three) times daily as directed.  Check blood sugar three times daily. Dx: E11.9 05/10/17   [name], DO  Lancets (ONETOUCH DELICA PLUS Q000111Q) [place] Patient not taking: Reported on 06/29/2022 10/30/19   [name], MD  losartan (COZAAR) 25 MG tablet TAKE ONE TABLET BY MOUTH ONCE DAILY Patient taking differently: Take 25 mg by mouth daily. 06/02/22   [name], [name], MD  metFORMIN (GLUCOPHAGE) 1000 MG tablet Take 1,000 mg by mouth daily. 05/13/22   [provider]  nitroGLYCERIN (NITROSTAT) 0.4 MG SL tablet Place 1 tablet (0.4 mg total) under the tongue every 5 (five) minutes as needed for chest pain. 10/27/17   [name], DO  ondansetron (ZOFRAN-ODT) 4 MG disintegrating tablet Take 1 tablet (4 mg total) by mouth every 8 (eight) hours as needed for nausea or vomiting. 09/05/21   Davonna Belling, MD  pantoprazole (PROTONIX) 40 MG tablet TAKE ONE TABLET BY MOUTH EVERY MORNING and TAKE ONE TABLET BY MOUTH EVERYDAY AT BEDTIME 07/13/22   Spero Geralds, MD  polyethylene glycol (MIRALAX / GLYCOLAX) 17 g packet Take 17 g by mouth 2 (two)  times daily. Patient taking differently: Take 17 g by mouth daily as needed for mild constipation. 10/06/20   [name], MD  REPATHA 140 MG/ML SOSY INJECT 1 PEN INTO THE SKIN EVERY 14 DAYS Patient taking differently: 140 mg by Subconjunctival route every 14 (fourteen) days. 09/08/20   [name], [name], MD  rosuvastatin (CRESTOR) 40 MG tablet Take 40 mg by mouth at bedtime. 08/16/21   [provider]  sildenafil (VIAGRA) 100 MG tablet Take 1 tablet (100 mg total) by mouth daily as needed for erectile dysfunction. 09/15/21   [name], MD  spironolactone (ALDACTONE) 25 MG tablet TAKE ONE TABLET BY MOUTH ONCE DAILY Patient taking differently: Take 25 mg by mouth daily. 12/14/21   [name], [name], MD                                                                                                                                    Past Surgical  History Past Surgical History:  Procedure Laterality Date   BIOPSY  11/22/2021   Procedure: BIOPSY;  Surgeon: Irene Shipper, MD;  Location: [facility];  Service: Gastroenterology;;   COLONOSCOPY  05/13/11, 08/2017   9 adenomas   COLONOSCOPY WITH PROPOFOL N/A 11/22/2021   Procedure: COLONOSCOPY WITH PROPOFOL;  Surgeon: Irene Shipper, MD;  Location: [facility];  Service: Gastroenterology;  Laterality: N/A;   ESOPHAGOGASTRODUODENOSCOPY (EGD) WITH PROPOFOL N/A 11/22/2021   Procedure: ESOPHAGOGASTRODUODENOSCOPY (EGD) WITH PROPOFOL;  Surgeon: Irene Shipper, MD;  Location: [facility];  Service: Gastroenterology;  Laterality: N/A;   FOREARM SURGERY     MUSCLE BIOPSY     POLYPECTOMY     POLYPECTOMY  11/22/2021   Procedure: POLYPECTOMY;  Surgeon: Irene Shipper, MD;  Location: [facility];  Service: Gastroenterology;;   RIGHT/LEFT HEART CATH AND CORONARY ANGIOGRAPHY N/A 08/25/2017   Procedure: RIGHT/LEFT HEART CATH AND CORONARY ANGIOGRAPHY;  Surgeon: [name], MD;  Location: [facility];  Service: Cardiovascular;  Laterality: N/A;   RIGHT/LEFT HEART CATH AND CORONARY ANGIOGRAPHY N/A 10/01/2019   Procedure: RIGHT/LEFT HEART CATH AND CORONARY ANGIOGRAPHY;  Surgeon: [name], MD;  Location: [facility];  Service: Cardiovascular;  Laterality: N/A;   RIGHT/LEFT HEART CATH AND CORONARY ANGIOGRAPHY N/A 04/07/2021   Procedure: RIGHT/LEFT HEART CATH AND CORONARY ANGIOGRAPHY;  Surgeon: [name], MD;  Location: [facility];  Service: Cardiovascular;  Laterality: N/A;   Family History Family History  Problem Relation Age of Onset   Heart disease Mother    Heart attack Mother 74   Hypertension Mother    Hyperlipidemia Mother    Diabetes Mother    Heart disease Father    Heart attack Father 34   Hypertension Father    Hyperlipidemia Father    Diabetes Father    Heart attack Sister 85   Colon cancer Neg Hx    Stomach cancer Neg Hx  Esophageal  cancer Neg Hx    Rectal cancer Neg Hx    Colon polyps Neg Hx    Lung disease Neg Hx     Social History Social History   Tobacco Use   Smoking status: Never   Smokeless tobacco: Never  Vaping Use   Vaping Use: Never used  Substance Use Topics   Alcohol use: No   Drug use: No   Allergies Patient has no known allergies.  Review of Systems Review of Systems  All other systems reviewed and are negative.   Physical Exam Vital Signs  I have reviewed the triage vital signs BP (!) 139/97   Pulse 80   Temp 98 F (36.7 C) (Oral)   Resp 15   SpO2 99%  Physical Exam Vitals and nursing note reviewed.  Constitutional:      General: He is not in acute distress.    Appearance: Normal appearance.  HENT:     Mouth/Throat:     Mouth: Mucous membranes are moist.  Eyes:     Conjunctiva/sclera: Conjunctivae normal.  Cardiovascular:     Rate and Rhythm: Normal rate and regular rhythm.  Pulmonary:     Effort: Pulmonary effort is normal. No respiratory distress.     Breath sounds: Normal breath sounds.  Chest:     Chest wall: Tenderness (reproduces chest pain) present.  Abdominal:     General: Abdomen is flat.     Palpations: Abdomen is soft.     Tenderness: There is no abdominal tenderness.  Musculoskeletal:     Right lower leg: No edema.     Left lower leg: No edema.  Skin:    General: Skin is warm and dry.     Capillary Refill: Capillary refill takes less than 2 seconds.  Neurological:     Mental Status: He is alert and oriented to person, place, and time. Mental status is at baseline.  Psychiatric:        Mood and Affect: Mood normal.        Behavior: Behavior normal.     ED Results and Treatments Labs (all labs ordered are listed, but only abnormal results are displayed) Labs Reviewed  BASIC METABOLIC PANEL - Abnormal; Notable for the following components:      Result Value   Sodium 131 (*)    Glucose, Bld 207 (*)    BUN 25 (*)    Creatinine, Ser 1.98 (*)     GFR, Estimated 37 (*)    All other components within normal limits  HEPATIC FUNCTION PANEL - Abnormal; Notable for the following components:   Total Protein 6.4 (*)    Total Bilirubin <0.1 (*)    All other components within normal limits  CBG MONITORING, ED - Abnormal; Notable for the following components:   Glucose-Capillary 177 (*)    All other components within normal limits  TROPONIN I (HIGH SENSITIVITY) - Abnormal; Notable for the following components:   Troponin I (High Sensitivity) 25 (*)    All other components within normal limits  TROPONIN I (HIGH SENSITIVITY) - Abnormal; Notable for the following components:   Troponin I (High Sensitivity) 18 (*)    All other components within normal limits  RESP PANEL BY RT-PCR (RSV, FLU A&B, COVID)  RVPGX2  CBC  LIPASE, BLOOD  Radiology CT ABDOMEN PELVIS W CONTRAST  Result Date: 08/20/2022 CLINICAL DATA:  Abdominal pain EXAM: CT ABDOMEN AND PELVIS WITH CONTRAST TECHNIQUE: Multidetector CT imaging of the abdomen and pelvis was performed using the standard protocol following bolus administration of intravenous contrast. RADIATION DOSE REDUCTION: This exam was performed according to the departmental dose-optimization program which includes automated exposure control, adjustment of the mA and/or kV according to patient size and/or use of iterative reconstruction technique. CONTRAST:  75 mL OMNIPAQUE IOHEXOL 350 MG/ML SOLN COMPARISON:  11/07/2021 FINDINGS: Lower chest: Lung bases clear. No pleural or pericardial effusion. There is a small hiatal hernia. Hepatobiliary: No focal liver abnormality is seen. No gallstones, gallbladder wall thickening, or biliary dilatation. Pancreas: Unremarkable. No pancreatic ductal dilatation or surrounding inflammatory changes. Spleen: Normal in size without focal abnormality. Adrenals/Urinary Tract:  No adrenal lesions. No hydronephrosis or nephrolithiasis. Right midpole 1.6 cm cyst (this does not need further evaluation). No stones in the ureters. Unremarkable urinary bladder. Stomach/Bowel: Stomach is within normal limits. Appendix is not identified and there is no evidence for appendicitis. No evidence of bowel wall thickening, distention, or inflammatory changes. Vascular/Lymphatic: Aortic atherosclerosis. No enlarged abdominal or pelvic lymph nodes. Reproductive: Prostate is unremarkable. Other: Small periumbilical abdominal wall hernia containing omental fat without evidence of incarceration. No abdominopelvic ascites. Musculoskeletal: No acute or significant osseous findings. IMPRESSION: 1. Right kidney cyst. 2. Tiny periumbilical abdominal wall hernia. 3. Small hiatal hernia. 4. No acute abdominal or pelvic pathology. Electronically Signed   By: Sammie Bench M.D.   On: 08/20/2022 18:16   DG Abdomen 1 View  Result Date: 08/20/2022 CLINICAL DATA:  Rule out constipation EXAM: ABDOMEN - 1 VIEW COMPARISON:  None Available. FINDINGS: No dilated loops of large or small bowel. Gas and stool in the rectum. No pathologic calcifications. No organomegaly. No acute osseous abnormality IMPRESSION: No evidence of bowel obstruction or constipation. Electronically Signed   By: Suzy Bouchard M.D.   On: 08/20/2022 15:23   DG Chest 2 View  Result Date: 08/20/2022 CLINICAL DATA:  chest pain EXAM: CHEST - 2 VIEW COMPARISON:  06/11/2022 FINDINGS: Cardiac silhouette is unremarkable. No pneumothorax or pleural effusion. The lungs are clear. Aorta is calcified. The visualized skeletal structures are unremarkable. IMPRESSION: No acute cardiopulmonary process. Electronically Signed   By: Sammie Bench M.D.   On: 08/20/2022 15:23    Pertinent labs & imaging results that were available during my care of the patient were reviewed by me and considered in my medical decision making (see MDM for  details).  Medications Ordered in ED Medications  alum & mag hydroxide-simeth (MAALOX/MYLANTA) 200-200-20 MG/5ML suspension 30 mL (30 mLs Oral Given 08/20/22 1624)  sodium chloride 0.9 % bolus 1,000 mL (0 mLs Intravenous Stopped 08/20/22 1724)  iohexol (OMNIPAQUE) 350 MG/ML injection 75 mL (75 mLs Intravenous Contrast Given 08/20/22 1803)  Procedures Procedures  (including critical care time)  Medical Decision Making / ED Course   MDM:  62 year old male presenting to the emergency department with chest and abdominal pain.  Patient well-appearing, physical exam reassuring, no focal abdominal tenderness.  EKG appears similar to prior, exception being has had previously flattened T waves in inferior leads, some T wave inversions currently.  Unclear cause of patient's symptoms, could represent gastritis, pancreatitis, cholecystitis, appendicitis, obstruction, volvulus.  patient also has chest pain extremely low concern for ACS, symptoms very atypical, has very minimal troponin elevation in the setting of CKD and heart failure.  Will trend second troponin but unless it rises significantly doubt relevant to current presentation.  Patient had a Lexi scan 1 month ago which did not show any dangerous perfusion deficits. Doubt vascular pathology such as dissection, patient reports the pain is intermittent, mild.  Will obtain CT scan of the abdomen, LFTs, lipase in addition to regular labs.  No leg swelling, recent travel or surgeries, pleuritic pain, hypoxia or tachycardia to suggest pulmonary embolism.  Will treat with Maalox and reassess.    Clinical Course as of 08/20/22 1904  Sat Aug 20, 2022  1759 Troponin I (High Sensitivity)(!): 18 Troponin down trending. Very low concern for ACS.  [WS]  [id number] Patient accidentally given IV contrast by RT. Thankfully eGFR >30.  Patient received IV fluids.  [WS]  1902 CT scan unremarkable.  Discussed with radiologist.  Patient reports his symptoms significantly improved following the Mylanta.  Suspect symptoms are due to reflux.  Patient has prescribed pantoprazole but he is not sure if he is taking it or not.  Emphasized importance of medication compliance and close follow-up with primary physician. Will discharge patient to home. All questions answered. Patient comfortable with plan of discharge. Return precautions discussed with patient and specified on the after visit summary. [WS]    Clinical Course User Index [WS] Cristie Hem, MD     Additional history obtained:  -External records from outside source obtained and reviewed including: Chart review including previous notes, labs, imaging, consultation notes including ED note 06/11/22, recent lexiscan    Lab Tests: -I ordered, reviewed, and interpreted labs.   The pertinent results include:   Labs Reviewed  BASIC METABOLIC PANEL - Abnormal; Notable for the following components:      Result Value   Sodium 131 (*)    Glucose, Bld 207 (*)    BUN 25 (*)    Creatinine, Ser 1.98 (*)    GFR, Estimated 37 (*)    All other components within normal limits  HEPATIC FUNCTION PANEL - Abnormal; Notable for the following components:   Total Protein 6.4 (*)    Total Bilirubin <0.1 (*)    All other components within normal limits  CBG MONITORING, ED - Abnormal; Notable for the following components:   Glucose-Capillary 177 (*)    All other components within normal limits  TROPONIN I (HIGH SENSITIVITY) - Abnormal; Notable for the following components:   Troponin I (High Sensitivity) 25 (*)    All other components within normal limits  TROPONIN I (HIGH SENSITIVITY) - Abnormal; Notable for the following components:   Troponin I (High Sensitivity) 18 (*)    All other components within normal limits  RESP PANEL BY RT-PCR (RSV, FLU A&B, COVID)  RVPGX2  CBC  LIPASE,  BLOOD    Notable for downtrending borderline troponin, mild AKI, normal lipase  EKG   EKG Interpretation  Date/Time:  Saturday August 20 2022 14:16:47 EST Ventricular Rate:  76 PR Interval:  184 QRS Duration: 88 QT Interval:  378 QTC Calculation: 425 R Axis:   -1 Text Interpretation: Normal sinus rhythm Cannot rule out Anterior infarct , age undetermined ST & T wave abnormality, consider lateral ischemia Abnormal ECG When compared with ECG of 11-Jun-2022 20:14, Inferior T wave changes are present Confirmed by Garnette Gunner [phone]) on 08/20/2022 3:03:02 PM         Imaging Studies ordered: I ordered imaging studies including CXR, CT abd On my interpretation imaging demonstrates no acute process I independently visualized and interpreted imaging. I agree with the radiologist interpretation   Medicines ordered and prescription drug management: Meds ordered this encounter  Medications   alum & mag hydroxide-simeth (MAALOX/MYLANTA) 200-200-20 MG/5ML suspension 30 mL   sodium chloride 0.9 % bolus 1,000 mL   iohexol (OMNIPAQUE) 350 MG/ML injection 75 mL   alum & mag hydroxide-simeth (MAALOX MAX) 400-400-40 MG/5ML suspension    Sig: Take 15 mLs by mouth every 6 (six) hours as needed for indigestion.    Dispense:  355 mL    Refill:  0    -I have reviewed the patients home medicines and have made adjustments as needed   Cardiac Monitoring: The patient was maintained on a cardiac monitor.  I personally viewed and interpreted the cardiac monitored which showed an underlying rhythm of: NSR  Reevaluation: After the interventions noted above, I reevaluated the patient and found that they have improved  Co morbidities that complicate the patient evaluation  Past Medical History:  Diagnosis Date   Adenoidal hypertrophy    Adrenal nodule 09/21/2020   AF (paroxysmal atrial fibrillation) 03/10/2018   AKI (acute kidney injury) 06/17/2019   Arthritis    Cataract    Mixed OU    Chronic anticoagulation 99991111   Chronic systolic heart failure 123456   Coronary artery disease    a. cath in 08/2017 showing mild nonobstructive CAD with scattered 20-30% stenosis.    Diabetic neuropathy 09/26/2017   Diabetic retinopathy    NPDR OU   Essential hypertension 11/22/2015   Foot pain, bilateral 02/01/2019   Gastroesophageal reflux disease 05/10/2017   Heart attack    While living in [place]   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 EF 20-25% by echo in 09/2017 with cath showing mild CAD. b.  Last echo 12/2017 EF 35-40%, grade 2 DD.   Obesity    Onychomycosis of multiple toenails with type 2 diabetes mellitus and peripheral neuropathy 05/10/2017   OSA on CPAP 05/10/2017   Right sided weakness 06/17/2019   Type II diabetes mellitus 11/20/2015   Unsteady gait 12/20/2018      Dispostion: Disposition decision including need for hospitalization was considered, and patient discharged from emergency department.    Final Clinical Impression(s) / ED Diagnoses Final diagnoses:  Epigastric pain     This chart was dictated using voice recognition software.  Despite best efforts to proofread,  errors can occur which can change the documentation meaning.    Cristie Hem, MD 08/20/22 [phone]

## 2022-08-23 DIAGNOSIS — Z0289 Encounter for other administrative examinations: Secondary | ICD-10-CM | POA: Diagnosis not present

## 2022-08-23 DIAGNOSIS — Z09 Encounter for follow-up examination after completed treatment for conditions other than malignant neoplasm: Secondary | ICD-10-CM | POA: Diagnosis not present

## 2022-08-28 NOTE — Progress Notes (Deleted)
Office Visit    Patient Name: Calvin [name] Date of Encounter: 08/28/2022  Primary Care Provider:  Roselee Nova, MD Primary Cardiologist:  [name], MD Primary Electrophysiologist: None  Chief Complaint    Calvin [name] is a 62 y.o. male with PMH of CAD s/p MI in 123456 chronic systolic CHF, NICM, DM type II, PAF, HTN, GERD, HLD who presents today for post ED follow-up of chest pain.   Past Medical History    Past Medical History:  Diagnosis Date   Adenoidal hypertrophy    Adrenal nodule 09/21/2020   AF (paroxysmal atrial fibrillation) 03/10/2018   AKI (acute kidney injury) 06/17/2019   Arthritis    Cataract    Mixed OU   Chronic anticoagulation 99991111   Chronic systolic heart failure [id number]   Coronary artery disease    a. cath in 08/2017 showing mild nonobstructive CAD with scattered 20-30% stenosis.    Diabetic neuropathy 09/26/2017   Diabetic retinopathy    NPDR OU   Essential hypertension 11/22/2015   Foot pain, bilateral 02/01/2019   Gastroesophageal reflux disease 05/10/2017   Heart attack    While living in [place]   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 EF 20-25% by echo in 09/2017 with cath showing mild CAD. b.  Last echo 12/2017 EF 35-40%, grade 2 DD.   Obesity    Onychomycosis of multiple toenails with type 2 diabetes mellitus and peripheral neuropathy 05/10/2017   OSA on CPAP 05/10/2017   Right sided weakness 06/17/2019   Type II diabetes mellitus 11/20/2015   Unsteady gait 12/20/2018   Past Surgical History:  Procedure Laterality Date   BIOPSY  11/22/2021   Procedure:  BIOPSY;  Surgeon: Irene Shipper, MD;  Location: [facility];  Service: Gastroenterology;;   COLONOSCOPY  05/13/11, 08/2017   9 adenomas   COLONOSCOPY WITH PROPOFOL N/A 11/22/2021   Procedure: COLONOSCOPY WITH PROPOFOL;  Surgeon: Irene Shipper, MD;  Location: [facility];  Service: Gastroenterology;  Laterality: N/A;   ESOPHAGOGASTRODUODENOSCOPY (EGD) WITH PROPOFOL N/A 11/22/2021   Procedure: ESOPHAGOGASTRODUODENOSCOPY (EGD) WITH PROPOFOL;  Surgeon: Irene Shipper, MD;  Location: [facility];  Service: Gastroenterology;  Laterality: N/A;   FOREARM SURGERY     MUSCLE BIOPSY     POLYPECTOMY     POLYPECTOMY  11/22/2021   Procedure: POLYPECTOMY;  Surgeon: Irene Shipper, MD;  Location: [facility];  Service: Gastroenterology;;   RIGHT/LEFT HEART CATH AND CORONARY ANGIOGRAPHY N/A 08/25/2017   Procedure: RIGHT/LEFT HEART CATH AND CORONARY ANGIOGRAPHY;  Surgeon: [name], MD;  Location: [facility];  Service: Cardiovascular;  Laterality: N/A;   RIGHT/LEFT HEART CATH AND CORONARY ANGIOGRAPHY N/A 10/01/2019   Procedure: RIGHT/LEFT HEART CATH AND CORONARY ANGIOGRAPHY;  Surgeon: [name], MD;  Location: [facility];  Service: Cardiovascular;  Laterality: N/A;   RIGHT/LEFT HEART CATH AND CORONARY ANGIOGRAPHY N/A 04/07/2021   Procedure: RIGHT/LEFT HEART CATH AND CORONARY ANGIOGRAPHY;  Surgeon: [name], MD;  Location: [facility];  Service: Cardiovascular;  Laterality: N/A;    Allergies  No Known Allergies  History of Present Illness    Calvin [name]  is a 62 year old male with the above mention past medical history who presents today for ED follow-up of chest pain. Calvin [name] was seen initially in 2017 when he presented with chest pain to the ED.  He reported left-sided chest pain that was sharp in nature and rated 9 out of 10.  He also endorsed dyspnea on activity with dry cough.  BNP was 88.6 and EKG noted T wave inversion in anterior leads.   POC troponins were negative and patient was started on heparin drip with reduction of chest pain to 6 out of 10.  He underwent ETT but test was stopped due to excessive fatigue and changed to [place] with no ischemia noted.  2D echo was completed showing EF of 40% with no valve disease and possible NICM noted.  He was discharged on lisinopril and Coreg with close follow-up.  He was lost to follow-up until 2019 when he was seen inpatient by Dr. Angelena Form for complaint of chest pain that he described as every other day for 3 years.  2D echo was obtained that showed cardiomyopathy with a EF of 20-25%.  R/LHC was performed 08/2017 that revealed mild nonobstructive CAD with normal filling pressures.  EF on cath was 35-45% and cardiomyopathy felt to be nonischemic.  He was discharged with carvedilol and lisinopril with BP not allowing further titration of lisinopril or carvedilol due to dizziness and possible orthostasis.  He presented to the ED 10/2017 with chest pain with headache and found to have negative delta troponins but BNP slightly elevated at 130.9.  Chest CT showed no evidence of aneurysm or dissection and MRI of the head with no acute findings.  2D echo was repeated 12/2017 with EF of 35-40% and grade 2 DD.  He was seen for evaluation of chest pains 03/2018 and was in PAF on telemetry.  Apixaban 5 mg twice daily was added and 30-day event monitor showed no AF and Eliquis and beta-blocker continued.  He is intolerant to Entresto due to hypotension and dizziness.  He was seen on 06/29/2022 following ED admission for chest pain with dizziness and fatigue.  During follow-up patient's blood pressures were low initially at 98/56 and increased to 100/62 on recheck.  He underwent Lexiscan Myoview to rule out any ischemic changes that showed no changes or evidence of ischemia.  He was provided an event monitor due to presyncope that was not returned.  Through reviewing his record he was seen on 08/20/2022 for complaint of  chest pain.  Troponins were trended and elevated to 18 and down trended and CT scan completed that was unremarkable.  Symptoms were suspected to be due to GI and not cardiac.  Patient's symptoms improved with Mylanta and was discharged in stable condition.  Since last being seen in the office patient reports***.  Patient denies chest pain, palpitations, dyspnea, PND, orthopnea, nausea, vomiting, dizziness, syncope, edema, weight gain, or early satiety.     ***Notes: -PCP recently with complaint of chest pain and epigastric pain found to have relief with Mylanta Home Medications    Current Outpatient Medications  Medication Sig Dispense Refill   alum & mag hydroxide-simeth (MAALOX MAX) 400-400-40 MG/5ML suspension Take 15 mLs by mouth every 6 (six) hours as needed for indigestion. 355 mL 0   amitriptyline (ELAVIL) 25 MG tablet Take 25 mg by mouth at bedtime.     bisacodyl (DULCOLAX) 5 MG EC tablet Take 2 tablets (10 mg total) by mouth every other day. Take at bedtime  30 tablet 0   Blood Glucose Monitoring Suppl ([place]) w/Device KIT 1 each by Other route daily. (Patient not taking: Reported on 06/29/2022)     Blood Pressure KIT Use as directed to check blood pressure twice daily. 1 kit 0   carvedilol (COREG) 12.5 MG tablet Take 1 tablet (12.5 mg total) by mouth 2 (two) times daily. 180 tablet 2   donepezil (ARICEPT) 10 MG tablet TAKE ONE TABLET BY MOUTH EVERYDAY AT BEDTIME (Patient taking differently: Take 10 mg by mouth at bedtime.) 30 tablet 11   ELIQUIS 5 MG TABS tablet TAKE ONE TABLET BY MOUTH EVERY MORNING and TAKE ONE TABLET BY MOUTH EVERYDAY AT BEDTIME 60 tablet 5   ezetimibe (ZETIA) 10 MG tablet TAKE ONE TABLET BY MOUTH EVERY MORNING (Patient taking differently: Take 10 mg by mouth daily.) 30 tablet 11   FARXIGA 10 MG TABS tablet TAKE ONE TABLET BY MOUTH EVERY MORNING 90 tablet 3   gabapentin (NEURONTIN) 100 MG capsule TAKE ONE CAPSULE BY MOUTH EVERY MORNING and TAKE  ONE CAPSULE BY MOUTH AT NOON and TAKE ONE CAPSULE BY MOUTH EVERYDAY AT BEDTIME 270 capsule 0   glucose blood (ONETOUCH VERIO) test strip 1 each by Other route 2 (two) times daily. And lancets 2/day (Patient not taking: Reported on 06/29/2022) 200 each 3   insulin degludec (TRESIBA FLEXTOUCH) 200 UNIT/ML FlexTouch Pen Inject 54 Units into the skin daily. And pen needles 1/day 18 mL 3   Insulin Syringe-Needle U-100 (INSULIN SYRINGE .3CC/29GX1/2") 29G X 1/2" 0.3 ML MISC Inject 1 Syringe 3 (three) times daily as directed. Check blood sugar three times daily. Dx: E11.9 100 each 3   Lancets (ONETOUCH DELICA PLUS Q000111Q) MISC USE TWICE DAILY (Patient not taking: Reported on 06/29/2022) 200 each 3   losartan (COZAAR) 25 MG tablet TAKE ONE TABLET BY MOUTH ONCE DAILY (Patient taking differently: Take 25 mg by mouth daily.) 90 tablet 3   metFORMIN (GLUCOPHAGE) 1000 MG tablet Take 1,000 mg by mouth daily.     nitroGLYCERIN (NITROSTAT) 0.4 MG SL tablet Place 1 tablet (0.4 mg total) under the tongue every 5 (five) minutes as needed for chest pain. 30 tablet 1   ondansetron (ZOFRAN-ODT) 4 MG disintegrating tablet Take 1 tablet (4 mg total) by mouth every 8 (eight) hours as needed for nausea or vomiting. 8 tablet 0   pantoprazole (PROTONIX) 40 MG tablet TAKE ONE TABLET BY MOUTH EVERY MORNING and TAKE ONE TABLET BY MOUTH EVERYDAY AT BEDTIME 60 tablet 5   polyethylene glycol (MIRALAX / GLYCOLAX) 17 g packet Take 17 g by mouth 2 (two) times daily. (Patient taking differently: Take 17 g by mouth daily as needed for mild constipation.) 14 each 0   REPATHA 140 MG/ML SOSY INJECT 1 PEN INTO THE SKIN EVERY 14 DAYS (Patient taking differently: 140 mg by Subconjunctival route every 14 (fourteen) days.) 6 mL 3   rosuvastatin (CRESTOR) 40 MG tablet Take 40 mg by mouth at bedtime.     sildenafil (VIAGRA) 100 MG tablet Take 1 tablet (100 mg total) by mouth daily as needed for erectile dysfunction. 10 tablet 11   spironolactone  (ALDACTONE) 25 MG tablet TAKE ONE TABLET BY MOUTH ONCE DAILY (Patient taking differently: Take 25 mg by mouth daily.) 90 tablet 3   No current facility-administered medications for this visit.     Review of Systems  Please see the history of present illness.    (+)*** (+)***  All other systems reviewed and  are otherwise negative except as noted above.  Physical Exam    Wt Readings from Last 3 Encounters:  07/05/22 194 lb (88 kg)  06/29/22 194 lb 12.8 oz (88.4 kg)  06/11/22 192 lb 0.3 oz (87.1 kg)   [id number] were no vitals filed for this visit.,There is no height or weight on file to calculate BMI.  Constitutional:      Appearance: Healthy appearance. Not in distress.  Neck:     Vascular: JVD normal.  Pulmonary:     Effort: Pulmonary effort is normal.     Breath sounds: No wheezing. No rales. Diminished in the bases Cardiovascular:     Normal rate. Regular rhythm. Normal S1. Normal S2.      Murmurs: There is no murmur.  Edema:    Peripheral edema absent.  Abdominal:     Palpations: Abdomen is soft non tender. There is no hepatomegaly.  Skin:    General: Skin is warm and dry.  Neurological:     General: No focal deficit present.     Mental Status: Alert and oriented to person, place and time.     Cranial Nerves: Cranial nerves are intact.  EKG/LABS/Other Studies Reviewed    ECG personally reviewed by me today - ***  Risk Assessment/Calculations:   {Does this patient have ATRIAL FIBRILLATION?:[id number]}        Lab Results  Component Value Date   WBC 5.7 08/20/2022   HGB 15.2 08/20/2022   HCT 46.1 08/20/2022   MCV 84.6 08/20/2022   PLT 233 08/20/2022   Lab Results  Component Value Date   CREATININE 1.98 (H) 08/20/2022   BUN 25 (H) 08/20/2022   NA 131 (L) 08/20/2022   K 4.4 08/20/2022   CL 99 08/20/2022   CO2 23 08/20/2022   Lab Results  Component Value Date   ALT 16 08/20/2022   AST 32 08/20/2022   ALKPHOS 68 08/20/2022   BILITOT <0.1 (L)  08/20/2022   Lab Results  Component Value Date   CHOL 129 05/25/2020   HDL 54 05/25/2020   LDLCALC 58 05/25/2020   TRIG 85 05/25/2020   CHOLHDL 2.4 05/25/2020    Lab Results  Component Value Date   HGBA1C 9.2 03/18/2022    Assessment & Plan     1.  Chest pain: -Today patient reports fleeting chest pain that occurs without any associated palpitations or dizziness.  - Lexiscan Myoview to rule out any ischemic changes -Patient encouraged to use as needed nitroglycerin and return precautions to the ED advised   2.  Chronic combined diastolic and systolic CHF: -Patient currently followed by advanced heart failure clinic and is euvolemic on exam today. -Current GDMT consist of carvedilol 12.5 mg twice daily, Farxiga 10 mg, losartan 25 mg, Aldactone 25 mg -Low sodium diet, fluid restriction <2L, and daily weights encouraged. Educated to contact our office for weight gain of 2 lbs overnight or 5 lbs in one week.    3.  Nonobstructive CAD: -s/p R/LHC completed on 04/07/2021 with 40% stenosis mid LAD, proximal circumflex 40%, distal lesion, 30% and proximal RCA to mid RCA lesion 40%. -Continue GDMT with Coreg 12.5 mg twice daily, ezetimibe 10 mg daily Crestor 40 mg daily   4.  Paroxysmal atrial fibrillation: -Patient currently sinus rhythm with rate in the mid 80s -Continue Eliquis 5 mg twice daily and current dose appropriate based on patient's recent creatinine -Continue carvedilol 12.5 mg twice daily   5.  Essential hypertension: -Patient's blood pressure today  was initially 98/56 and was 100/62 on recheck -Continue carvedilol 12.5 mg twice daily, spironolactone, 25 mg losartan 25 mg    6.  Presyncope: -Patient reports dizziness and presyncope that has occurred since ED visit -14-day ZIO monitor to evaluate for possible arrhythmia or pauses.    Disposition: Follow-up with [name], MD or APP in *** months {Are you ordering a CV Procedure (e.g. stress test, cath,  DCCV, TEE, etc)?   Press F2        :[id number]   Medication Adjustments/Labs and Tests Ordered: Current medicines are reviewed at length with the patient today.  Concerns regarding medicines are outlined above.   Signed, Mable Fill, Marissa Nestle, NP 08/28/2022, 2:20 PM [facility]  Note:  This document was prepared using Dragon voice recognition software and may include unintentional dictation errors.

## 2022-08-30 ENCOUNTER — Ambulatory Visit: Payer: Medicare Other | Attending: Nurse Practitioner | Admitting: Nurse Practitioner

## 2022-08-30 DIAGNOSIS — I2510 Atherosclerotic heart disease of native coronary artery without angina pectoris: Secondary | ICD-10-CM

## 2022-08-30 DIAGNOSIS — R42 Dizziness and giddiness: Secondary | ICD-10-CM

## 2022-08-30 DIAGNOSIS — I10 Essential (primary) hypertension: Secondary | ICD-10-CM

## 2022-08-30 DIAGNOSIS — I5022 Chronic systolic (congestive) heart failure: Secondary | ICD-10-CM

## 2022-08-30 DIAGNOSIS — I480 Paroxysmal atrial fibrillation: Secondary | ICD-10-CM

## 2022-09-21 ENCOUNTER — Encounter: Payer: Self-pay | Admitting: Physician Assistant

## 2022-09-21 ENCOUNTER — Ambulatory Visit: Payer: Medicare Other | Admitting: Physician Assistant

## 2022-11-22 ENCOUNTER — Other Ambulatory Visit: Payer: Self-pay | Admitting: Family Medicine

## 2022-11-22 DIAGNOSIS — R112 Nausea with vomiting, unspecified: Secondary | ICD-10-CM

## 2022-12-04 ENCOUNTER — Encounter (HOSPITAL_COMMUNITY): Payer: Self-pay

## 2022-12-04 ENCOUNTER — Other Ambulatory Visit: Payer: Self-pay

## 2022-12-04 ENCOUNTER — Emergency Department (HOSPITAL_COMMUNITY)
Admission: EM | Admit: 2022-12-04 | Discharge: 2022-12-04 | Disposition: A | Discharge status: Home / Self Care | Payer: Medicare HMO | Attending: Emergency Medicine | Admitting: Emergency Medicine

## 2022-12-04 DIAGNOSIS — Z79899 Other long term (current) drug therapy: Secondary | ICD-10-CM | POA: Insufficient documentation

## 2022-12-04 DIAGNOSIS — Z7984 Long term (current) use of oral hypoglycemic drugs: Secondary | ICD-10-CM | POA: Diagnosis not present

## 2022-12-04 DIAGNOSIS — I2510 Atherosclerotic heart disease of native coronary artery without angina pectoris: Secondary | ICD-10-CM | POA: Diagnosis not present

## 2022-12-04 DIAGNOSIS — I110 Hypertensive heart disease with heart failure: Secondary | ICD-10-CM | POA: Insufficient documentation

## 2022-12-04 DIAGNOSIS — Z794 Long term (current) use of insulin: Secondary | ICD-10-CM | POA: Diagnosis not present

## 2022-12-04 DIAGNOSIS — I509 Heart failure, unspecified: Secondary | ICD-10-CM | POA: Diagnosis not present

## 2022-12-04 DIAGNOSIS — E119 Type 2 diabetes mellitus without complications: Secondary | ICD-10-CM | POA: Diagnosis not present

## 2022-12-04 DIAGNOSIS — R21 Rash and other nonspecific skin eruption: Secondary | ICD-10-CM | POA: Diagnosis present

## 2022-12-04 DIAGNOSIS — Z7901 Long term (current) use of anticoagulants: Secondary | ICD-10-CM | POA: Diagnosis not present

## 2022-12-04 DIAGNOSIS — L259 Unspecified contact dermatitis, unspecified cause: Secondary | ICD-10-CM | POA: Diagnosis not present

## 2022-12-04 MED ORDER — DIPHENHYDRAMINE HCL 25 MG PO CAPS
25.0000 mg | ORAL_CAPSULE | Freq: Once | ORAL | Status: AC
  Administered 2022-12-04: 25 mg via ORAL
  Filled 2022-12-04: qty 1

## 2022-12-04 MED ORDER — HYDROCORTISONE 0.5 % EX CREA
TOPICAL_CREAM | Freq: Once | CUTANEOUS | Status: AC
  Filled 2022-12-04: qty 28.35

## 2022-12-04 NOTE — ED Triage Notes (Signed)
Pt arrived POV from home c/o having a rash on his upper back that itches really bad. Pt states he noticed it a week ago but it is not getting better.

## 2022-12-04 NOTE — Discharge Instructions (Signed)
You were seen in the ER today for a rash.  As we discussed, I think your rash was caused by an allergic reaction to something. We have given you a dose of benadryl for itching, and a steroid cream. Please use the cream twice a day. You can take 25 mg of benadryl every 6 hours as needed for itching.  Continue to monitor how you're doing and return to the ER for new or worsening symptoms.

## 2022-12-04 NOTE — ED Provider Notes (Signed)
[place] EMERGENCY DEPARTMENT AT [facility] Provider Note   CSN: [id number] Arrival date & time: 12/04/22  [id number]     History  Chief Complaint  Patient presents with   Rash    Calvin [name] is a 62 y.o. male with history of heart failure, CAD, diabetes, paroxysmal atrial fibrillation, hypertension, OSA, hyperlipidemia, neurocognitive disorder who presents the emergency department complaining of a rash.  Patient states that he started noticing a lot of itching on his upper back a few days ago.  He was worried about scratching it and creating a sore, since he has diabetes.  He has not tried anything for it.  He denies any changes to shampoo, body wash, after shave, detergents.  Denies any new medications.  Denies any fever.   Rash      Home Medications Prior to Admission medications   Medication Sig Start Date End Date Taking? Authorizing Provider  alum & mag hydroxide-simeth (MAALOX MAX) 400-400-40 MG/5ML suspension Take 15 mLs by mouth every 6 (six) hours as needed for indigestion. 08/20/22   Lonell Grandchild, MD  amitriptyline (ELAVIL) 25 MG tablet Take 25 mg by mouth at bedtime. 08/16/21   [provider]  bisacodyl (DULCOLAX) 5 MG EC tablet Take 2 tablets (10 mg total) by mouth every other day. Take at bedtime 12/04/20   Iva Boop, MD  Blood Glucose Monitoring Suppl (ONETOUCH VERIO FLEX SYSTEM) w/Device KIT 1 each by Other route daily. Patient not taking: Reported on 06/29/2022 10/28/21   [provider]  Blood Pressure KIT Use as directed to check blood pressure twice daily. 06/29/22   Gaston Islam., NP  carvedilol (COREG) 12.5 MG tablet Take 1 tablet (12.5 mg total) by mouth 2 (two) times daily. 05/09/22 02/03/23  Jacklynn Ganong, FNP  donepezil (ARICEPT) 10 MG tablet TAKE ONE TABLET BY MOUTH EVERYDAY AT BEDTIME Patient taking differently: Take 10 mg by mouth at bedtime. 03/23/22   Wertman, Sara E, [name]  ELIQUIS 5 MG TABS tablet TAKE  ONE TABLET BY MOUTH EVERY MORNING and TAKE ONE TABLET BY MOUTH EVERYDAY AT BEDTIME 08/11/22   [name], Bevelyn Buckles, MD  ezetimibe (ZETIA) 10 MG tablet TAKE ONE TABLET BY MOUTH EVERY MORNING Patient taking differently: Take 10 mg by mouth daily. 05/05/22   [name], Bevelyn Buckles, MD  FARXIGA 10 MG TABS tablet TAKE ONE TABLET BY MOUTH EVERY MORNING 12/06/21   [name], Bevelyn Buckles, MD  gabapentin (NEURONTIN) 100 MG capsule TAKE ONE CAPSULE BY MOUTH EVERY MORNING and TAKE ONE CAPSULE BY MOUTH AT NOON and TAKE ONE CAPSULE BY MOUTH EVERYDAY AT BEDTIME 08/16/21   Sharon Seller, NP  glucose blood (ONETOUCH VERIO) test strip 1 each by Other route 2 (two) times daily. And lancets 2/day Patient not taking: Reported on 06/29/2022 09/15/21   Romero Belling, MD  insulin degludec (TRESIBA FLEXTOUCH) 200 UNIT/ML FlexTouch Pen Inject 54 Units into the skin daily. And pen needles 1/day 09/15/21   Romero Belling, MD  Insulin Syringe-Needle U-100 (INSULIN SYRINGE .3CC/29GX1/2") 29G X 1/2" 0.3 ML MISC Inject 1 Syringe 3 (three) times daily as directed. Check blood sugar three times daily. Dx: E11.9 05/10/17   Kirt Boys, DO  Lancets [facility] DELICA PLUS [place]) MISC USE TWICE DAILY Patient not taking: Reported on 06/29/2022 10/30/19   Romero Belling, MD  losartan (COZAAR) 25 MG tablet TAKE ONE TABLET BY MOUTH ONCE DAILY Patient taking differently: Take 25 mg by mouth daily. 06/02/22   [name], Bevelyn Buckles,  MD  metFORMIN (GLUCOPHAGE) 1000 MG tablet Take 1,000 mg by mouth daily. 05/13/22   [provider]  nitroGLYCERIN (NITROSTAT) 0.4 MG SL tablet Place 1 tablet (0.4 mg total) under the tongue every 5 (five) minutes as needed for chest pain. 10/27/17   Kirt Boys, DO  ondansetron (ZOFRAN-ODT) 4 MG disintegrating tablet Take 1 tablet (4 mg total) by mouth every 8 (eight) hours as needed for nausea or vomiting. 09/05/21   Benjiman Core, MD  pantoprazole (PROTONIX) 40 MG tablet TAKE ONE TABLET BY MOUTH EVERY MORNING  and TAKE ONE TABLET BY MOUTH EVERYDAY AT BEDTIME 07/13/22   Charlott Holler, MD  polyethylene glycol (MIRALAX / GLYCOLAX) 17 g packet Take 17 g by mouth 2 (two) times daily. Patient taking differently: Take 17 g by mouth daily as needed for mild constipation. 10/06/20   Iva Boop, MD  REPATHA 140 MG/ML SOSY INJECT 1 PEN INTO THE SKIN EVERY 14 DAYS Patient taking differently: 140 mg by Subconjunctival route every 14 (fourteen) days. 09/08/20   [name], Bevelyn Buckles, MD  rosuvastatin (CRESTOR) 40 MG tablet Take 40 mg by mouth at bedtime. 08/16/21   [provider]  sildenafil (VIAGRA) 100 MG tablet Take 1 tablet (100 mg total) by mouth daily as needed for erectile dysfunction. 09/15/21   Romero Belling, MD  spironolactone (ALDACTONE) 25 MG tablet TAKE ONE TABLET BY MOUTH ONCE DAILY Patient taking differently: Take 25 mg by mouth daily. 12/14/21   [name], Bevelyn Buckles, MD      Allergies    Patient has no known allergies.    Review of Systems   Review of Systems  Skin:  Positive for rash.  All other systems reviewed and are negative.   Physical Exam Updated Vital Signs BP 130/75 (BP Location: Right Arm)   Pulse 97   Temp 98.3 F (36.8 C)   Resp 18   Ht 5\' 7"  (1.702 m)   Wt 84.8 kg   SpO2 100%   BMI 29.29 kg/m  Physical Exam Vitals and nursing note reviewed.  Constitutional:      Appearance: Normal appearance.  HENT:     Head: Normocephalic and atraumatic.  Eyes:     Conjunctiva/sclera: Conjunctivae normal.  Pulmonary:     Effort: Pulmonary effort is normal. No respiratory distress.  Skin:    General: Skin is warm and dry.     Comments: Several raised lesions with some mild hyperpigmentation to the bilateral upper back.  No erythema or purulence.  Not dermatomal  Neurological:     Mental Status: He is alert.  Psychiatric:        Mood and Affect: Mood normal.        Behavior: Behavior normal.     ED Results / Procedures / Treatments   Labs (all labs ordered are  listed, but only abnormal results are displayed) Labs Reviewed - No data to display  EKG None  Radiology No results found.  Procedures Procedures    Medications Ordered in ED Medications  diphenhydrAMINE (BENADRYL) capsule 25 mg (has no administration in time range)  hydrocortisone cream 0.5 % (has no administration in time range)    ED Course/ Medical Decision Making/ A&P                             Medical Decision Making Risk OTC drugs.   Patient is a 62 year old male with history of heart failure, CAD, diabetes, paroxysmal atrial  fibrillation, hypertension, OSA, hyperlipidemia, neurocognitive disorder, who presents to the ER complaining of a rash x 1 week.  On exam patient has some raised lesions with hyperpigmentation to his upper back.  Does not appear infected, does not follow dermatomal pattern.  Suspect contact dermatitis, unknown trigger.  Not consistent with an emergent skin condition.  Patient given tablet of Benadryl, and hydrocortisone cream.  Encouraged to use the same at home.  Given close return precautions, discharged in stable condition.  Final Clinical Impression(s) / ED Diagnoses Final diagnoses:  Contact dermatitis, unspecified contact dermatitis type, unspecified trigger    Rx / DC Orders ED Discharge Orders     None      Portions of this report may have been transcribed using voice recognition software. Every effort was made to ensure accuracy; however, inadvertent computerized transcription errors may be present.    Jeanella Flattery 12/04/22 [id number]    Wynetta Fines, MD 12/04/22 1536

## 2022-12-06 ENCOUNTER — Other Ambulatory Visit (HOSPITAL_COMMUNITY): Payer: Self-pay | Admitting: Internal Medicine

## 2022-12-20 ENCOUNTER — Encounter (HOSPITAL_COMMUNITY): Payer: Self-pay | Admitting: Internal Medicine

## 2022-12-20 ENCOUNTER — Ambulatory Visit (HOSPITAL_COMMUNITY)
Admission: RE | Admit: 2022-12-20 | Discharge: 2022-12-20 | Disposition: A | Discharge status: Home / Self Care | Payer: Medicare HMO | Source: Ambulatory Visit | Attending: Internal Medicine | Admitting: Internal Medicine

## 2022-12-20 VITALS — BP 100/60 | HR 86 | Wt 167.6 lb

## 2022-12-20 DIAGNOSIS — Z79899 Other long term (current) drug therapy: Secondary | ICD-10-CM | POA: Insufficient documentation

## 2022-12-20 DIAGNOSIS — I2510 Atherosclerotic heart disease of native coronary artery without angina pectoris: Secondary | ICD-10-CM | POA: Diagnosis not present

## 2022-12-20 DIAGNOSIS — E785 Hyperlipidemia, unspecified: Secondary | ICD-10-CM | POA: Insufficient documentation

## 2022-12-20 DIAGNOSIS — E1142 Type 2 diabetes mellitus with diabetic polyneuropathy: Secondary | ICD-10-CM | POA: Diagnosis not present

## 2022-12-20 DIAGNOSIS — I10 Essential (primary) hypertension: Secondary | ICD-10-CM

## 2022-12-20 DIAGNOSIS — I480 Paroxysmal atrial fibrillation: Secondary | ICD-10-CM | POA: Insufficient documentation

## 2022-12-20 DIAGNOSIS — I110 Hypertensive heart disease with heart failure: Secondary | ICD-10-CM | POA: Diagnosis present

## 2022-12-20 DIAGNOSIS — E1136 Type 2 diabetes mellitus with diabetic cataract: Secondary | ICD-10-CM | POA: Diagnosis not present

## 2022-12-20 DIAGNOSIS — E1151 Type 2 diabetes mellitus with diabetic peripheral angiopathy without gangrene: Secondary | ICD-10-CM | POA: Insufficient documentation

## 2022-12-20 DIAGNOSIS — I959 Hypotension, unspecified: Secondary | ICD-10-CM | POA: Insufficient documentation

## 2022-12-20 DIAGNOSIS — K219 Gastro-esophageal reflux disease without esophagitis: Secondary | ICD-10-CM | POA: Diagnosis not present

## 2022-12-20 DIAGNOSIS — I428 Other cardiomyopathies: Secondary | ICD-10-CM | POA: Insufficient documentation

## 2022-12-20 DIAGNOSIS — I252 Old myocardial infarction: Secondary | ICD-10-CM | POA: Insufficient documentation

## 2022-12-20 DIAGNOSIS — I5022 Chronic systolic (congestive) heart failure: Secondary | ICD-10-CM | POA: Diagnosis not present

## 2022-12-20 NOTE — Patient Instructions (Signed)
Medication Changes: NO MEDICATION CHANGES AT THIS TIME   Testing/Procedures:  Your physician has requested that you have an echocardiogram. Echocardiography is a painless test that uses sound waves to create images of your heart. It provides your doctor with information about the size and shape of your heart and how well your heart's chambers and valves are working. This procedure takes approximately one hour. There are no restrictions for this procedure. Please do NOT wear cologne, perfume, aftershave, or lotions (deodorant is allowed). Please arrive 15 minutes prior to your appointment time.  Follow-Up in: 3 MONTHS  At the [facility], you and your health needs are our priority. We have a designated team specialized in the treatment of Heart Failure. This Care Team includes your primary Heart Failure Specialized Cardiologist (physician), Advanced Practice Providers (APPs- Physician Assistants and Nurse Practitioners), and Pharmacist who all work together to provide you with the care you need, when you need it.   You may see any of the following providers on your designated Care Team at your next follow up:  Dr. Arvilla Meres Dr. Marca Ancona Dr. Marcos Eke, NP Robbie Lis, [place] [facility] [place], [place] Brynda Peon, NP Karle Plumber, PharmD   Please be sure to bring in all your medications bottles to every appointment.   Need to Contact us:  If you have any questions or concerns before your next appointment please send [place] a message through [place] or call our office at [phone].    TO LEAVE A MESSAGE FOR THE NURSE SELECT OPTION 2, PLEASE LEAVE A MESSAGE INCLUDING: YOUR NAME DATE OF BIRTH CALL BACK NUMBER REASON FOR CALL**this is important as we prioritize the call backs  YOU WILL RECEIVE A CALL BACK THE SAME DAY AS LONG AS YOU CALL BEFORE 4:00 PM

## 2022-12-20 NOTE — Addendum Note (Signed)
Encounter addended by: Linda Hedges, RN on: 12/20/2022 11:57 AM  Actions taken: Order list changed, Diagnosis association updated, Clinical Note Signed

## 2022-12-20 NOTE — Progress Notes (Signed)
Advanced Heart Failure Clinic Note   PCP: Ellyn Hack, MD Primary Cardiologist: Dr. Clifton James  Neurology: Dr. Karel Jarvis  HF Cardiologist: Dr. Gala Romney   HPI Calvin [name] is a 62 y.o. male with history of chronic systolic HF due to NICM, uncontrolled DM2, PAF, GERD, HTN, MI 2015, memory issues, and hyperlipidemia.   Admitted 2/19 with CP. Underwent LHC/RHC with mild nonobstructive CAD & EF 35-40%.  Readmitted in 10/2017 with chest pain. CTA was negative for PE.   Sleep study 07/2018 normal.   Echo 7/20 EF 35-40%, mild LVH, normal RV size and function. No significant valvular dysfunction.   Seen in HF clinic 3/21 for first time/ R/L cath in 3/21 with non-obstructive CAD (LAD 75m, 60d, LCX 40% diffuse, RCA 30p, 57m). RHC well compensated PCWP 8 CI 4.3  Echo 02/08/21 EF 30-35% RV mild HK   Echo 8/23 EF 35-40%  CPX 8/22 with severe HF limitation.  BP rest: 110/64 BP peak: 114/60  Peak VO2: 13.3 (52% predicted peak VO2)  VE/VCO2 slope:  57  OUES: 0.95  Peak RER: 1.07  Ventilatory Threshold: 10.3 (40% predicted or measured peak VO2)  Peak RR 80  Peak Ventilation:  65.3  VE/MVV:  60%  PETCO2 at peak:  26  O2pulse:  10   (75% predicted O2pulse)   cMRI 03/17/21 with LVEF 45%, no LGE  Repeat R/LHC in  04/07/21 for severe CP and dyspnea  mild nonobstructive CAD, LVEF recovered to 55-60% by visual estimate, normal hemodynamics  Echo 8/23 showed EF 35-40%, moderate LVH, grade I DD, RV normal.  Today he returns for HF follow up. Feels great. Still gets dizzy at times - particularly when standing up too fast. No CP or SOB. No edema, orthopnea or PND. Walks a lot - 2-3hr at a time. Compliant with meds. Lost 20 pounds.    Cardiac Studies - Echo (8/23) showed EF 35-40%, moderate LVH, grade I DD, RV normal  - cMRI (9/22): EF 45%, no LGE, RV okay, RVEF 47%, breathhold artifact noted  - R/LHC (10/22):  Ao = 144/69  LV = 144/9 RA = 3 RV = 30/6 PA = 26/9 (16) PCW = 10 Fick cardiac  output/index = 5.8/3.1 SVR = 1370 PVR =  1.0 WU Ao sat = 95% PA sat = 73%, 74% SVC sat = 79%   Mid LAD lesion is 40% stenosed.   Prox Cx to Dist Cx lesion is 40% stenosed.   Dist RCA lesion is 30% stenosed.   Prox RCA to Mid RCA lesion is 40% stenosed.   The left ventricular ejection fraction is 55-65% by visual estimate.  - Echo (8/22): EF 30-35%, RV mildly reduced, trivial Calvin  - Echo (3/21): EF 40-45% RV ok.   - L/RHC (3/21): with non-obstructive CAD (LAD 75m, 60d, LCX 40% diffuse, RCA 30p, 63m). RHC well compensated PCWP 8 CI 4.3  - Echo (6/19): EF 35-40% Grade II DD. Moderate global reduction in LV systolic function; moderate DD.  - Echo (3/19): EF 20-25% Grade IDD   - L/RHC (2/19)  RA 5  PA 22/11 (15)  PCWP 9  CO 6 CI 3/14  Prox RCA to Mid RCA lesion is 30% stenosed. Mid RCA to Dist RCA lesion is 30% stenosed. Prox Cx to Dist Cx lesion is 20% stenosed. Mid LAD lesion is 30% stenosed. There is mild to moderate left ventricular systolic dysfunction. LV end diastolic pressure is mildly elevated. The left ventricular ejection fraction is 35-45% by visual  estimate. There is no mitral valve regurgitation.    SH: Disabled for 6 years due to diabetes. He does not smoke. Denies cocaine abuse.  Past Medical History:  Diagnosis Date   Adenoidal hypertrophy    Adrenal nodule 09/21/2020   AF (paroxysmal atrial fibrillation) 03/10/2018   AKI (acute kidney injury) 06/17/2019   Arthritis    Cataract    Mixed OU   Chronic anticoagulation 10/15/2019   Chronic systolic heart failure 11/22/2015   Coronary artery disease    a. cath in 08/2017 showing mild nonobstructive CAD with scattered 20-30% stenosis.    Diabetic neuropathy 09/26/2017   Diabetic retinopathy    NPDR OU   Essential hypertension 11/22/2015   Foot pain, bilateral 02/01/2019   Gastroesophageal reflux disease 05/10/2017   Heart attack    While living in [place]   History of adenomatous colonic polyps 05/19/2011    05/2011 9 adenomas 08/2017 5 adenomas (dimin) Recall 2022   History of COVID-19 06/18/2019   HLD (hyperlipidemia) 11/20/2015   Hypoglycemia due to insulin 10/15/2019   Insomnia 05/10/2017   Intermittent claudication 05/25/2020   Mild neurocognitive disorder due to Alzheimer's disease, possible 08/24/2021   Nonischemic cardiomyopathy    a. EF 20-25% by echo in 09/2017 with cath showing mild CAD. b.  Last echo 12/2017 EF 35-40%, grade 2 DD.   Obesity    Onychomycosis of multiple toenails with type 2 diabetes mellitus and peripheral neuropathy 05/10/2017   OSA on CPAP 05/10/2017   Right sided weakness 06/17/2019   Type II diabetes mellitus 11/20/2015   Unsteady gait 12/20/2018    Current Outpatient Medications  Medication Sig Dispense Refill   alum & mag hydroxide-simeth (MAALOX MAX) 400-400-40 MG/5ML suspension Take 15 mLs by mouth every 6 (six) hours as needed for indigestion. 355 mL 0   amitriptyline (ELAVIL) 25 MG tablet Take 25 mg by mouth at bedtime.     bisacodyl (DULCOLAX) 5 MG EC tablet Take 2 tablets (10 mg total) by mouth every other day. Take at bedtime 30 tablet 0   Blood Glucose Monitoring Suppl (ONETOUCH VERIO FLEX SYSTEM) w/Device KIT 1 each by Other route daily.     Blood Pressure KIT Use as directed to check blood pressure twice daily. 1 kit 0   carvedilol (COREG) 6.25 MG tablet Take 6.25 mg by mouth 2 (two) times daily with a meal.     docusate sodium (COLACE) 100 MG capsule Take 100 mg by mouth 2 (two) times daily.     donepezil (ARICEPT) 10 MG tablet TAKE ONE TABLET BY MOUTH EVERYDAY AT BEDTIME 30 tablet 11   ELIQUIS 5 MG TABS tablet TAKE ONE TABLET BY MOUTH EVERY MORNING and TAKE ONE TABLET BY MOUTH EVERYDAY AT BEDTIME 60 tablet 5   ezetimibe (ZETIA) 10 MG tablet TAKE ONE TABLET BY MOUTH EVERY MORNING 30 tablet 11   FARXIGA 10 MG TABS tablet TAKE ONE TABLET BY MOUTH EVERY MORNING 90 tablet 3   glucose blood (ONETOUCH VERIO) test strip 1 each by Other route 2 (two)  times daily. And lancets 2/day 200 each 3   insulin degludec (TRESIBA FLEXTOUCH) 200 UNIT/ML FlexTouch Pen Inject 54 Units into the skin daily. And pen needles 1/day 18 mL 3   insulin glargine (LANTUS) 100 UNIT/ML injection Inject 36 Units into the skin at bedtime.     insulin lispro (HUMALOG) 100 UNIT/ML injection Inject 16 Units into the skin 3 (three) times daily before meals.     Insulin Syringe-Needle U-100 (  INSULIN SYRINGE .3CC/29GX1/2") 29G X 1/2" 0.3 ML MISC Inject 1 Syringe 3 (three) times daily as directed. Check blood sugar three times daily. Dx: E11.9 100 each 3   lactulose (CEPHULAC) 10 g packet Take 30 mLs by mouth as needed.     Lancets (ONETOUCH DELICA PLUS LANCET30G) MISC USE TWICE DAILY 200 each 3   losartan (COZAAR) 25 MG tablet TAKE ONE TABLET BY MOUTH ONCE DAILY 90 tablet 3   metFORMIN (GLUCOPHAGE) 1000 MG tablet Take 1,000 mg by mouth daily.     metoCLOPramide (REGLAN) 10 MG tablet Take 10 mg by mouth 2 (two) times daily.     nitroGLYCERIN (NITROSTAT) 0.4 MG SL tablet Place 1 tablet (0.4 mg total) under the tongue every 5 (five) minutes as needed for chest pain. 30 tablet 1   ondansetron (ZOFRAN-ODT) 4 MG disintegrating tablet Take 1 tablet (4 mg total) by mouth every 8 (eight) hours as needed for nausea or vomiting. 8 tablet 0   pantoprazole (PROTONIX) 40 MG tablet TAKE ONE TABLET BY MOUTH EVERY MORNING and TAKE ONE TABLET BY MOUTH EVERYDAY AT BEDTIME 60 tablet 5   polyethylene glycol (MIRALAX / GLYCOLAX) 17 g packet Take 17 g by mouth 2 (two) times daily. 14 each 0   rosuvastatin (CRESTOR) 40 MG tablet Take 40 mg by mouth at bedtime.     sildenafil (VIAGRA) 100 MG tablet Take 1 tablet (100 mg total) by mouth daily as needed for erectile dysfunction. 10 tablet 11   spironolactone (ALDACTONE) 25 MG tablet TAKE ONE TABLET BY MOUTH EVERY MORNING 90 tablet 3   triamcinolone cream (KENALOG) 0.1 % Apply 1 Application topically daily.     REPATHA 140 MG/ML SOSY INJECT 1 PEN INTO THE  SKIN EVERY 14 DAYS (Patient not taking: Reported on 12/20/2022) 6 mL 3   No current facility-administered medications for this encounter.   No Known Allergies  Social History   Socioeconomic History   Marital status: Married    Spouse name: Not on file   Number of children: 0   Years of education: 13   Highest education level: Some college, no degree  Occupational History   Occupation: Disability  Tobacco Use   Smoking status: Never   Smokeless tobacco: Never  Vaping Use   Vaping Use: Never used  Substance and Sexual Activity   Alcohol use: No   Drug use: No   Sexual activity: Yes    Birth control/protection: None  Other Topics Concern   Not on file  Social History Narrative   Social History   Diet?    Do you drink/eat things with caffeine? yes   Marital status?       single      Do you live in a house, apartment, assisted living, condo, trailer, etc.? yes   Is it one or more stories? One story   How many persons live in your home?   Do you have any pets in your home? (please list)    Highest level of education completed? graduate   Do you exercise?            no                          Type & how often?   Advanced Directives   Do you have a living will? no   Do you have a DNR form?  If not, do you want to discuss one? no   Do you have signed POA/HPOA for forms? no      Functional Status   Do you have difficulty bathing or dressing yourself? no   Do you have difficulty preparing food or eating? no   Do you have difficulty managing your medications? no   Do you have difficulty managing your finances? no   Do you have difficulty affording your medications? no   Social Determinants of Health   Financial Resource Strain: Not on file  Food Insecurity: No Food Insecurity (08/25/2021)   Hunger Vital Sign    Worried About Running Out of Food in the Last Year: Never true    Ran Out of Food in the Last Year: Never true  Transportation  Needs: Unknown (02/15/2021)   PRAPARE - Administrator, Civil Service (Medical): No    Lack of Transportation (Non-Medical): Not on file  Physical Activity: Not on file  Stress: Not on file  Social Connections: Not on file  Intimate Partner Violence: Not on file      Family History  Problem Relation Age of Onset   Heart disease Mother    Heart attack Mother 51   Hypertension Mother    Hyperlipidemia Mother    Diabetes Mother    Heart disease Father    Heart attack Father 69   Hypertension Father    Hyperlipidemia Father    Diabetes Father    Heart attack Sister 13   Colon cancer Neg Hx    Stomach cancer Neg Hx    Esophageal cancer Neg Hx    Rectal cancer Neg Hx    Colon polyps Neg Hx    Lung disease Neg Hx    BP 100/60   Pulse 86   Wt 76 kg (167 lb 9.6 oz)   SpO2 99%   BMI 26.25 kg/m   Wt Readings from Last 3 Encounters:  12/20/22 76 kg (167 lb 9.6 oz)  12/04/22 84.8 kg (187 lb)  07/05/22 88 kg (194 lb)   PHYSICAL EXAM: General:  Well appearing. No resp difficulty HEENT: normal Neck: supple. no JVD. Carotids 2+ bilat; no bruits. No lymphadenopathy or thryomegaly appreciated. Cor: PMI nondisplaced. Regular rate & rhythm. No rubs, gallops or murmurs. Lungs: clear Abdomen: soft, nontender, nondistended. No hepatosplenomegaly. No bruits or masses. Good bowel sounds. Extremities: no cyanosis, clubbing, rash, edema Neuro: alert & orientedx3, cranial nerves grossly intact. moves all 4 extremities w/o difficulty. Affect pleasant  ASSESSMENT & PLAN: 1. Chronic Systolic /Diastolic Heart Failure with recovered LV function, NICM - Echo (6/19): EF 35-40%. LHC 2/219 with nonobstructive CAD.  - Echo (7/20): EF 35-40% - Echo (3/21): EF 40-45% RV ok  - R/LHC (3/21): with non-obstructive CAD (LAD 26m, 60d, LCX 40% diffuse, RCA 30p, 46m). RHC well compensated PCWP 8 CI 4.3 - Echo (8/22): EF 30-35% RV mild HK  - CPX (8/22): severe HF limitation with blunted BP  response, elevated MW/UX32 slope - cMRI (9/22) LVEF 45%, no LGE, RVEF 47% - R/LHC (10/22): EF 55-60% visually, mild CAD, normal hemodynamics - Echo (8/23): EF 35-40%, RV ok. - Continues to improved NYHAI-II volume looks good today. - Continue carvedilol 12.5 mg bid. - Continue Farxiga 10 mg daily.  - Continue spironolactone 25 mg daily.  - Continue losartan 25 daily (failed Entresto x 2 with low BP) - BP too low to titrate meds - Had labs today with PCP - Repeat echo at next visit  2. HTN - Well controlled  3. Type 2 diabetes mellitus - hgb A1C 9.2 (9/23) - Continue Farxiga. - No change  4. CAD:  - mild nonobstructive CAD by cath 10/22 - No s/s angina - No ASA w/ Eliquis.  - Continue ? blocker. - On statin, repatha and zetia -> lipids managed by PCP  5. PAF - Eliquis for a/c.  - In NSR today - No bleeding on Eliquis  Arvilla Meres, MD  11:50 AM

## 2022-12-26 ENCOUNTER — Other Ambulatory Visit: Payer: Medicare Other

## 2023-01-18 ENCOUNTER — Other Ambulatory Visit: Payer: Medicare HMO

## 2023-01-31 ENCOUNTER — Other Ambulatory Visit: Payer: Self-pay | Admitting: Family Medicine

## 2023-01-31 ENCOUNTER — Ambulatory Visit
Admission: RE | Admit: 2023-01-31 | Discharge: 2023-01-31 | Disposition: A | Discharge status: Home / Self Care | Payer: Medicare HMO | Source: Ambulatory Visit | Attending: Family Medicine | Admitting: Family Medicine

## 2023-01-31 DIAGNOSIS — N2889 Other specified disorders of kidney and ureter: Secondary | ICD-10-CM

## 2023-01-31 DIAGNOSIS — R112 Nausea with vomiting, unspecified: Secondary | ICD-10-CM

## 2023-02-06 ENCOUNTER — Ambulatory Visit: Payer: Medicare HMO | Admitting: Podiatry

## 2023-02-23 ENCOUNTER — Ambulatory Visit: Payer: Medicare HMO | Admitting: Podiatry

## 2023-02-26 ENCOUNTER — Ambulatory Visit
Admission: RE | Admit: 2023-02-26 | Discharge: 2023-02-26 | Disposition: A | Discharge status: Home / Self Care | Payer: Medicare HMO | Source: Ambulatory Visit | Attending: Family Medicine | Admitting: Family Medicine

## 2023-02-26 DIAGNOSIS — N2889 Other specified disorders of kidney and ureter: Secondary | ICD-10-CM

## 2023-02-26 MED ORDER — GADOPICLENOL 0.5 MMOL/ML IV SOLN
8.0000 mL | Freq: Once | INTRAVENOUS | Status: AC | PRN
  Administered 2023-02-26: 8 mL via INTRAVENOUS

## 2023-03-10 ENCOUNTER — Encounter: Payer: Medicare HMO | Admitting: *Deleted

## 2023-03-10 VITALS — BP 140/72 | HR 89 | Temp 97.8°F | Resp 18 | Ht 67.0 in | Wt 165.3 lb

## 2023-03-10 DIAGNOSIS — Z006 Encounter for examination for normal comparison and control in clinical research program: Secondary | ICD-10-CM

## 2023-03-10 NOTE — Research (Signed)
AA HEART  Informed Consent   Subject Name: Calvin [name]  Subject met inclusion and exclusion criteria.  The informed consent form, study requirements and expectations were reviewed with the subject and questions and concerns were addressed prior to the signing of the consent form.  The subject verbalized understanding of the trial requirements.  The subject agreed to participate in the AA HEART  trial and signed the informed consent at 08:57 on 03-10-2023.  The informed consent was obtained prior to performance of any protocol-specific procedures for the subject.  A copy of the signed informed consent was given to the subject and a copy was placed in the subject's medical record.   [name]    Are there any labs that are clinically significant?  Yes []  OR No[]   Please FORWARD back to me with any changes or follow up!      ACCESSION NO. [id number]                                             Page 1 of 1                                                        INVESTIGATOR: ([id number])                          PROTOCOL   [id number]                     Thomasene Ripple, M.D.                              INVESTIGATOR NO.: [id number]                     c/o [facility]                         SUBJECT NUMBER: [id number]                     [place]                 SUBJECT INITIALS NOT COLLECTED:                     [address]                          VISIT: D0                     [place], [place] [address]                   SPONSOR REPORT TO:                 COLLECTION TIME:09:45 DATE:10-Mar-2023                     Cruzita Lederer                 DATE RECEIVED IN LABORATORY: 11-Mar-2023  c/o Sponsor [employer] access         DATE REPORTED BY LABORATORY: 11-Mar-2023                     [place]                          SEX: M  AGE: 68G                     [address]                   [place], [place] [phone]                                                                                         Ref. Ranges               Clinical    Comments                                                                          Significance                                                                            Yes*  No                    HEMATOLOGY&DIFFERENTIAL PANEL                      HGB            15.3         12.5-17.0 g/dL                      HCT            46           37-51 %                      RBC            5.0          4.0-5.8 x106/uL                      MCH            31           26-34 pg                      MCHC  33           31-38 g/dL                      RDW            13.7         12.0-15.0 %                      RBC Morph      No Review Required                        MCV            92           80-100 fL                      WBC            4.17         3.80-10.70 x103/uL                      Neutrophil     2.25         1.96-7.23 x103/uL                      Lymphocyte     1.48         0.80-3.00 x103/uL                      Monocytes      0.34         0.12-0.92 x103/uL                      Eosinophil     0.07         0.00-0.57 x103/uL                      Basophils      0.04         0.00-0.20 x103/uL                      Neutrophil     53.9         40.5-75.0 %                      Lymphocyte     35.4         15.4-48.5%                      Monocytes      8.1          2.6-10.1 %                      Eosinophil     1.8          0.0-6.8 %                      Basophils      0.8          0.0-2.0 %                      Platelets      208          130-394 x103/uL  ANC  ANC            2.25         1.96-7.23 x103/uL                    HBA1C                      Fast HbA1c     11.8    H    <6.5%   RETICULOCYTES                      Retic %        1.9          0.6-2.5 %                      Retic Abs      0.094        0.030-0.130 x106/uL     [id number] LPA  COLLECTION D/T                      Untimed D      10-Mar-2023                        Untimed T      09:45                            SM/PLASMA PROTEO & BMS D/T                      Untimed D      10-Mar-2023                        Untimed T      09:45                            SM/WB DNA COLLECTION D/T                      Untimed D      10-Mar-2023                        Untimed T      09:45                            SM/WB PAXGENE RNA COLLECT D/T                      Untimed D      10-Mar-2023                        Untimed T      09:45                            SUBJECT FASTED AT LEAST 9 HRS?                      Pt fast 9h     Yes

## 2023-03-10 NOTE — Progress Notes (Addendum)
Patient is here for enrollment in AA Heart.  He has a history of prior MI in 2015, but non obstructive disease, and no MRI evidence of prior scar.  We do not have those records,  He has chronic systolic and diastolic heart failure, and sees Dr. Gala Romney.  He has type 2 DM and histsory of PAF.  He had cath in 2022 which shows the following   Mid LAD lesion is 40% stenosed.   Prox Cx to Dist Cx lesion is 40% stenosed.   Dist RCA lesion is 30% stenosed.   Prox RCA to Mid RCA lesion is 40% stenosed.   The left ventricular ejection fraction is 55-65% by visual estimate.   Findings:   Ao = 144/69  LV = 144/9 RA = 3 RV = 30/6 PA = 26/9 (16) PCW = 10 Fick cardiac output/index = 5.8/3.1 SVR = 1370 PVR =  1.0 WU Ao sat = 95% PA sat = 73%, 74% SVC sat = 79%   Assessment: Mild non-obstructive CAD EF recovered now 55-60% Normal hemodynamics    Plan/Discussion:    Medical therapy.    Subject met inclusion and exclusion criteria.  The informed consent form, study requirements and expectations were reviewed with the subject and questions and concerns were addressed prior to the signing of the consent form.  The subject verbalized understanding of the trial requirements.  The subject agreed to participate in the AA HEART  trial and signed the informed consent at 08:57 on 03-10-2023.  The informed consent was obtained prior to performance of any protocol-specific procedures for the subject.  A copy of the signed informed consent was given to the subject and a copy was placed in the subject's medical record.         BP 140/72  BP Location Left Arm  Pulse 89  Resp 18  Temp 97.8 F (36.6 C)  SpO2 100 %  Weight 165 lb 5.5 oz (75 kg)  Height 5\' 7"  (1.702 m)      Alert, oriented male in NAD Lungs clear Cor  regular no definite murmur Abd soft without masses Ext no edema  Patient agreess to proceed with AA Heart  Arturo Morton.Riley Kill ,MD Medical Director, [facility]

## 2023-03-22 ENCOUNTER — Encounter (HOSPITAL_COMMUNITY): Payer: Self-pay

## 2023-03-22 ENCOUNTER — Ambulatory Visit (HOSPITAL_COMMUNITY)
Admission: RE | Admit: 2023-03-22 | Discharge: 2023-03-22 | Disposition: A | Discharge status: Home / Self Care | Payer: Medicare HMO | Source: Ambulatory Visit | Attending: Family Medicine | Admitting: Family Medicine

## 2023-03-22 ENCOUNTER — Ambulatory Visit (HOSPITAL_BASED_OUTPATIENT_CLINIC_OR_DEPARTMENT_OTHER)
Admission: RE | Admit: 2023-03-22 | Discharge: 2023-03-22 | Disposition: A | Discharge status: Home / Self Care | Payer: Medicare HMO | Source: Ambulatory Visit | Attending: Family Medicine

## 2023-03-22 VITALS — BP 144/80 | HR 76 | Wt 167.0 lb

## 2023-03-22 DIAGNOSIS — I428 Other cardiomyopathies: Secondary | ICD-10-CM | POA: Diagnosis not present

## 2023-03-22 DIAGNOSIS — I2510 Atherosclerotic heart disease of native coronary artery without angina pectoris: Secondary | ICD-10-CM | POA: Diagnosis not present

## 2023-03-22 DIAGNOSIS — E1165 Type 2 diabetes mellitus with hyperglycemia: Secondary | ICD-10-CM

## 2023-03-22 DIAGNOSIS — I5042 Chronic combined systolic (congestive) and diastolic (congestive) heart failure: Secondary | ICD-10-CM | POA: Insufficient documentation

## 2023-03-22 DIAGNOSIS — I10 Essential (primary) hypertension: Secondary | ICD-10-CM

## 2023-03-22 DIAGNOSIS — I480 Paroxysmal atrial fibrillation: Secondary | ICD-10-CM

## 2023-03-22 DIAGNOSIS — Z7984 Long term (current) use of oral hypoglycemic drugs: Secondary | ICD-10-CM | POA: Diagnosis not present

## 2023-03-22 DIAGNOSIS — I5022 Chronic systolic (congestive) heart failure: Secondary | ICD-10-CM

## 2023-03-22 DIAGNOSIS — Z79899 Other long term (current) drug therapy: Secondary | ICD-10-CM | POA: Insufficient documentation

## 2023-03-22 DIAGNOSIS — I252 Old myocardial infarction: Secondary | ICD-10-CM | POA: Diagnosis not present

## 2023-03-22 DIAGNOSIS — K219 Gastro-esophageal reflux disease without esophagitis: Secondary | ICD-10-CM | POA: Diagnosis not present

## 2023-03-22 DIAGNOSIS — E785 Hyperlipidemia, unspecified: Secondary | ICD-10-CM | POA: Insufficient documentation

## 2023-03-22 DIAGNOSIS — E118 Type 2 diabetes mellitus with unspecified complications: Secondary | ICD-10-CM | POA: Diagnosis not present

## 2023-03-22 DIAGNOSIS — E1159 Type 2 diabetes mellitus with other circulatory complications: Secondary | ICD-10-CM | POA: Diagnosis not present

## 2023-03-22 DIAGNOSIS — I110 Hypertensive heart disease with heart failure: Secondary | ICD-10-CM | POA: Diagnosis not present

## 2023-03-22 DIAGNOSIS — R0602 Shortness of breath: Secondary | ICD-10-CM | POA: Insufficient documentation

## 2023-03-22 DIAGNOSIS — Z794 Long term (current) use of insulin: Secondary | ICD-10-CM | POA: Insufficient documentation

## 2023-03-22 LAB — CBC
HCT: 43.2 % (ref 39.0–52.0)
Hemoglobin: 14.7 g/dL (ref 13.0–17.0)
MCH: 30.3 pg (ref 26.0–34.0)
MCHC: 34 g/dL (ref 30.0–36.0)
MCV: 89.1 fL (ref 80.0–100.0)
Platelets: 218 10*3/uL (ref 150–400)
RBC: 4.85 MIL/uL (ref 4.22–5.81)
RDW: 12.3 % (ref 11.5–15.5)
WBC: 5.1 10*3/uL (ref 4.0–10.5)
nRBC: 0 % (ref 0.0–0.2)

## 2023-03-22 LAB — ECHOCARDIOGRAM COMPLETE
Calc EF: 29.6 %
S' Lateral: 3.4 cm
Single Plane A2C EF: 31.6 %
Single Plane A4C EF: 25.8 %

## 2023-03-22 LAB — BASIC METABOLIC PANEL WITH GFR
Anion gap: 11 (ref 5–15)
BUN: 10 mg/dL (ref 8–23)
CO2: 28 mmol/L (ref 22–32)
Calcium: 9.2 mg/dL (ref 8.9–10.3)
Chloride: 99 mmol/L (ref 98–111)
Creatinine, Ser: 1.2 mg/dL (ref 0.61–1.24)
GFR, Estimated: >60 mL/min (ref >60)
Glucose, Bld: 261 mg/dL — ABNORMAL HIGH (ref 70–99)
Potassium: 4 mmol/L (ref 3.5–5.1)
Sodium: 138 mmol/L (ref 135–145)

## 2023-03-22 LAB — HEMOGLOBIN A1C
Hgb A1c MFr Bld: 12 % — ABNORMAL HIGH (ref 4.8–5.6)
Mean Plasma Glucose: 297.7 mg/dL

## 2023-03-22 LAB — BRAIN NATRIURETIC PEPTIDE: B Natriuretic Peptide: 145 pg/mL — ABNORMAL HIGH (ref 0.0–100.0)

## 2023-03-22 NOTE — Patient Instructions (Signed)
Medication Changes:  PLEASE CALL OUR CLINIC [phone] OPTION 2 TO VERIFY WHICH MEDICATIONS YOU ARE CURRENTLY TAKING  Lab Work:  Labs done today, your results will be available in MyChart, we will contact you for abnormal readings.  Follow-Up in: 4 MONTHS WITH DR. Gala Romney PLEASE CALL OUR OFFICE AROUND NOVEMBER TO GET SCHEDULED FOR YOUR APPOINTMENT. PHONE NUMBER IS [phone] OPTION 2   At the [facility] Clinic, you and your health needs are our priority. We have a designated team specialized in the treatment of Heart Failure. This Care Team includes your primary Heart Failure Specialized Cardiologist (physician), Advanced Practice Providers (APPs- Physician Assistants and Nurse Practitioners), and Pharmacist who all work together to provide you with the care you need, when you need it.   You may see any of the following providers on your designated Care Team at your next follow up:  Dr. Arvilla Meres Dr. Marca Ancona Dr. Marcos Eke, NP Robbie Lis, [place] [facility] [place], [place] Brynda Peon, NP Karle Plumber, PharmD   Please be sure to bring in all your medications bottles to every appointment.   Need to Contact us:  If you have any questions or concerns before your next appointment please send [place] a message through [place] or call our office at [phone].    TO LEAVE A MESSAGE FOR THE NURSE SELECT OPTION 2, PLEASE LEAVE A MESSAGE INCLUDING: YOUR NAME DATE OF BIRTH CALL BACK NUMBER REASON FOR CALL**this is important as we prioritize the call backs  YOU WILL RECEIVE A CALL BACK THE SAME DAY AS LONG AS YOU CALL BEFORE 4:00 PM

## 2023-03-22 NOTE — Progress Notes (Addendum)
Advanced Heart Failure Clinic Note   PCP: [facility] Primary Cardiologist: Dr. Clifton James  Neurology: Dr. Karel Jarvis  HF Cardiologist: Dr. Gala Romney   HPI Mr Clemon is a 62 y.o. male with history of chronic systolic HF due to NICM, uncontrolled DM2, PAF, GERD, HTN, MI 2015, memory issues, and hyperlipidemia.   Admitted 2/19 with CP. Underwent LHC/RHC with mild nonobstructive CAD & EF 35-40%.  Readmitted in 10/2017 with chest pain. CTA was negative for PE.   Sleep study 07/2018 normal.   Echo 7/20 EF 35-40%, mild LVH, normal RV size and function. No significant valvular dysfunction.   Seen in HF clinic 3/21 for first time/ R/L cath in 3/21 with non-obstructive CAD (LAD 79m, 60d, LCX 40% diffuse, RCA 30p, 59m). RHC well compensated PCWP 8 CI 4.3  Echo 02/08/21 EF 30-35% RV mild HK   Echo 8/23 EF 35-40%  CPX 8/22 with severe HF limitation.  BP rest: 110/64 BP peak: 114/60  Peak VO2: 13.3 (52% predicted peak VO2)  VE/VCO2 slope:  57  OUES: 0.95  Peak RER: 1.07  Ventilatory Threshold: 10.3 (40% predicted or measured peak VO2)  Peak RR 80  Peak Ventilation:  65.3  VE/MVV:  60%  PETCO2 at peak:  26  O2pulse:  10  (75% predicted O2pulse)   cMRI 03/17/21 with LVEF 45%, no LGE  Repeat R/LHC in  04/07/21 for severe CP and dyspnea  mild nonobstructive CAD, LVEF recovered to 55-60% by visual estimate, normal hemodynamics.  Echo 8/23 showed EF 35-40%, moderate LVH, grade I DD, RV normal.  Today he returns for HF follow up. Overall feeling fine. He has "good days and bad days." He has SOB walking up steps. Walks 3-4 miles/day on flat ground. Continues to have dizziness, walks with a cane for balance. Blood sugars have been high.  Denies palpitations, CP, edema, abnormal bleeding, or PND/Orthopnea. Appetite ok. No fever or chills. Weight at home 176 pounds. Doesn't know what meds he is taking.  No tobacco/ETOH use.  Echo today 03/22/23, EF appears 30-35% on my read,  awaiting official MD interpretation.   Cardiac Studies - Echo (8/23) showed EF 35-40%, moderate LVH, grade I DD, RV normal  - cMRI (9/22): EF 45%, no LGE, RV okay, RVEF 47%, breathhold artifact noted  - R/LHC (10/22): Ao = 144/69  LV = 144/9 RA = 3 RV = 30/6 PA = 26/9 (16) PCW = 10 Fick cardiac output/index = 5.8/3.1 SVR = 1370 PVR =  1.0 WU Ao sat = 95% PA sat = 73%, 74% SVC sat = 79%   Mid LAD lesion is 40% stenosed.   Prox Cx to Dist Cx lesion is 40% stenosed.   Dist RCA lesion is 30% stenosed.   Prox RCA to Mid RCA lesion is 40% stenosed.   The left ventricular ejection fraction is 55-65% by visual estimate.  - Echo (8/22): EF 30-35%, RV mildly reduced, trivial MR  - Echo (3/21): EF 40-45% RV ok.   - L/RHC (3/21): with non-obstructive CAD (LAD 76m, 60d, LCX 40% diffuse, RCA 30p, 85m). RHC well compensated PCWP 8 CI 4.3  - Echo (6/19): EF 35-40% Grade II DD. Moderate global reduction in LV systolic function; moderate DD.  - Echo (3/19): EF 20-25% Grade IDD   - L/RHC (2/19)  RA 5  PA 22/11 (15)  PCWP 9  CO 6 CI 3/14  Prox RCA to Mid RCA lesion is 30% stenosed. Mid RCA to Dist RCA lesion is 30%  stenosed. Prox Cx to Dist Cx lesion is 20% stenosed. Mid LAD lesion is 30% stenosed. There is mild to moderate left ventricular systolic dysfunction. LV end diastolic pressure is mildly elevated. The left ventricular ejection fraction is 35-45% by visual estimate. There is no mitral valve regurgitation.   SH: Disabled for 6 years due to diabetes. He does not smoke. Denies cocaine abuse.   Past Medical History:  Diagnosis Date   Adenoidal hypertrophy    Adrenal nodule 09/21/2020   AF (paroxysmal atrial fibrillation) 03/10/2018   AKI (acute kidney injury) 06/17/2019   Arthritis    Cataract    Mixed OU   Chronic anticoagulation 10/15/2019   Chronic systolic heart failure 11/22/2015   Coronary artery disease    a. cath in 08/2017 showing mild nonobstructive CAD  with scattered 20-30% stenosis.    Diabetic neuropathy 09/26/2017   Diabetic retinopathy    NPDR OU   Essential hypertension 11/22/2015   Foot pain, bilateral 02/01/2019   Gastroesophageal reflux disease 05/10/2017   Heart attack    While living in [place]   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 EF 20-25% by echo in 09/2017 with cath showing mild CAD. b.  Last echo 12/2017 EF 35-40%, grade 2 DD.   Obesity    Onychomycosis of multiple toenails with type 2 diabetes mellitus and peripheral neuropathy 05/10/2017   OSA on CPAP 05/10/2017   Right sided weakness 06/17/2019   Type II diabetes mellitus 11/20/2015   Unsteady gait 12/20/2018   Current Outpatient Medications  Medication Sig Dispense Refill   amitriptyline (ELAVIL) 25 MG tablet Take 25 mg by mouth at bedtime.     amLODipine (NORVASC) 10 MG tablet Take 10 mg by mouth daily.     carvedilol (COREG) 6.25 MG tablet Take 6.25 mg by mouth 2 (two) times daily with a meal.     donepezil (ARICEPT) 10 MG tablet TAKE ONE TABLET BY MOUTH EVERYDAY AT BEDTIME 30 tablet 11   ezetimibe (ZETIA) 10 MG tablet TAKE ONE TABLET BY MOUTH EVERY MORNING 30 tablet 11   lactulose (CEPHULAC) 10 g packet Take 30 mLs by mouth as needed.     metoCLOPramide (REGLAN) 10 MG tablet Take 10 mg by mouth 2 (two) times daily.     pantoprazole (PROTONIX) 40 MG tablet TAKE ONE TABLET BY MOUTH EVERY MORNING and TAKE ONE TABLET BY MOUTH EVERYDAY AT BEDTIME 60 tablet 5   rosuvastatin (CRESTOR) 40 MG tablet Take 40 mg by mouth at bedtime.     spironolactone (ALDACTONE) 25 MG tablet TAKE ONE TABLET BY MOUTH EVERY MORNING 90 tablet  3   alum & mag hydroxide-simeth (MAALOX MAX) 400-400-40 MG/5ML suspension Take 15 mLs by mouth every 6 (six) hours as needed for indigestion. 355 mL 0   bisacodyl (DULCOLAX) 5 MG EC tablet Take 2 tablets (10 mg total) by mouth every other day. Take at bedtime 30 tablet 0   Blood Glucose Monitoring Suppl (ONETOUCH VERIO FLEX SYSTEM) w/Device KIT 1 each by Other route daily.     Blood Pressure KIT Use as directed to check blood pressure twice daily. 1 kit 0   docusate sodium (COLACE) 100  MG capsule Take 100 mg by mouth 2 (two) times daily.     ELIQUIS 5 MG TABS tablet TAKE ONE TABLET BY MOUTH EVERY MORNING and TAKE ONE TABLET BY MOUTH EVERYDAY AT BEDTIME 60 tablet 5   FARXIGA 10 MG TABS tablet TAKE ONE TABLET BY MOUTH EVERY MORNING 90 tablet 3   glucose blood (ONETOUCH VERIO) test strip 1 each by Other route 2 (two) times daily. And lancets 2/day 200 each 3   insulin degludec (TRESIBA FLEXTOUCH) 200 UNIT/ML FlexTouch Pen Inject 54 Units into the skin daily. And pen needles 1/day 18 mL 3   insulin glargine (LANTUS) 100 UNIT/ML injection Inject 36 Units into the skin at bedtime.     insulin lispro (HUMALOG) 100 UNIT/ML injection Inject 16 Units into the skin 3 (three) times daily before meals.     Insulin Syringe-Needle U-100 (INSULIN SYRINGE .3CC/29GX1/2") 29G X 1/2" 0.3 ML MISC Inject 1 Syringe 3 (three) times daily as directed. Check blood sugar three times daily. Dx: E11.9 100 each 3   Lancets (ONETOUCH DELICA PLUS LANCET30G) MISC USE TWICE DAILY 200 each 3   losartan (COZAAR) 25 MG tablet TAKE ONE TABLET BY MOUTH ONCE DAILY 90 tablet 3   metFORMIN (GLUCOPHAGE) 1000 MG tablet Take 1,000 mg by mouth daily.     nitroGLYCERIN (NITROSTAT) 0.4 MG SL tablet Place 1 tablet (0.4 mg total) under the tongue every 5 (five) minutes as needed for chest pain. 30 tablet 1   ondansetron (ZOFRAN-ODT) 4 MG disintegrating tablet Take 1 tablet (4 mg total) by mouth every 8 (eight) hours as needed for nausea or  vomiting. 8 tablet 0   polyethylene glycol (MIRALAX / GLYCOLAX) 17 g packet Take 17 g by mouth 2 (two) times daily. 14 each 0   REPATHA 140 MG/ML SOSY INJECT 1 PEN INTO THE SKIN EVERY 14 DAYS (Patient not taking: Reported on 12/20/2022) 6 mL 3   sildenafil (VIAGRA) 100 MG tablet Take 1 tablet (100 mg total) by mouth daily as needed for erectile dysfunction. 10 tablet 11   triamcinolone cream (KENALOG) 0.1 % Apply 1 Application topically daily.     No current facility-administered medications for this encounter.   No Known Allergies  Social History   Socioeconomic History   Marital status: Married    Spouse name: Not on file   Number of children: 0   Years of education: 13   Highest education level: Some college, no degree  Occupational History   Occupation: Disability  Tobacco Use   Smoking status: Never   Smokeless tobacco: Never  Vaping Use   Vaping status: Never Used  Substance and Sexual Activity   Alcohol use: No   Drug use: No   Sexual activity: Yes    Birth control/protection: None  Other Topics Concern   Not on file  Social History Narrative   Social History   Diet?    Do you drink/eat things with caffeine? yes   Marital status?       single      Do you live in a house, apartment, assisted living, condo, trailer, etc.? yes   Is it one or more stories? One story   How many persons live in your home?   Do you have any pets in your home? (please list)    Highest level of education completed? graduate   Do you exercise?            no  Type & how often?   Advanced Directives   Do you have a living will? no   Do you have a DNR form?                                  If not, do you want to discuss one? no   Do you have signed POA/HPOA for forms? no      Functional Status   Do you have difficulty bathing or dressing yourself? no   Do you have difficulty preparing food or eating? no   Do you have difficulty managing your medications? no   Do  you have difficulty managing your finances? no   Do you have difficulty affording your medications? no   Social Determinants of Health   Financial Resource Strain: Not on file  Food Insecurity: No Food Insecurity (08/25/2021)   Hunger Vital Sign    Worried About Running Out of Food in the Last Year: Never true    Ran Out of Food in the Last Year: Never true  Transportation Needs: Unknown (02/15/2021)   PRAPARE - Administrator, Civil Service (Medical): No    Lack of Transportation (Non-Medical): Not on file  Physical Activity: Not on file  Stress: Not on file  Social Connections: Not on file  Intimate Partner Violence: Not on file   Family History  Problem Relation Age of Onset   Heart disease Mother    Heart attack Mother 25   Hypertension Mother    Hyperlipidemia Mother    Diabetes Mother    Heart disease Father    Heart attack Father 35   Hypertension Father    Hyperlipidemia Father    Diabetes Father    Heart attack Sister 13   Colon cancer Neg Hx    Stomach cancer Neg Hx    Esophageal cancer Neg Hx    Rectal cancer Neg Hx    Colon polyps Neg Hx    Lung disease Neg Hx    BP (!) 144/80   Pulse 76   Wt 75.8 kg (167 lb)   SpO2 99%   BMI 26.16 kg/m   Wt Readings from Last 3 Encounters:  03/22/23 75.8 kg (167 lb)  03/10/23 75 kg (165 lb 5.5 oz)  12/20/22 76 kg (167 lb 9.6 oz)   PHYSICAL EXAM: General:  NAD. No resp difficulty, walked into clinic, thin HEENT: Normal Neck: Supple. No JVD. Carotids 2+ bilat; no bruits. No lymphadenopathy or thryomegaly appreciated. Cor: PMI nondisplaced. Regular rate & rhythm. No rubs, gallops or murmurs. Lungs: Clear Abdomen: Soft, nontender, nondistended. No hepatosplenomegaly. No bruits or masses. Good bowel sounds. Extremities: No cyanosis, clubbing, rash, edema Neuro: Alert & oriented x 3, cranial nerves grossly intact. Moves all 4 extremities w/o difficulty. Affect pleasant.  ECG (personally reviewed): NSR 86  bpm  ASSESSMENT & PLAN: 1. Chronic Systolic /Diastolic Heart Failure with recovered LV function, NICM - Echo (6/19): EF 35-40%. LHC 2/219 with nonobstructive CAD.  - Echo (7/20): EF 35-40% - Echo (3/21): EF 40-45% RV ok  - R/LHC (3/21): with non-obstructive CAD (LAD 75m, 60d, LCX 40% diffuse, RCA 30p, 3m). RHC well compensated PCWP 8 CI 4.3 - Echo (8/22): EF 30-35% RV mild HK  - CPX (8/22): severe HF limitation with blunted BP response, elevated NW/GN56 slope - cMRI (9/22) LVEF 45%, no LGE, RVEF 47% - R/LHC (10/22): EF 55-60% visually, mild CAD, normal  hemodynamics - Echo (8/23): EF 35-40%, RV ok. - Echo today 03/22/23, EF appears 30-35% on my read, awaiting official MD result - Improved NYHA II, volume looks good today. - Continue carvedilol 12.5 mg bid. - Continue Farxiga 10 mg daily.  - Continue spironolactone 25 mg daily.  - Continue losartan 25 mg daily (failed Entresto x 2 with low BP) - Labs today. - No med changes today; I asked him to call clinic when he gets home to verify his med list - Consider referral to EP for ICD, await official echo results. QRS narrow; may need repeat cMRI to further quantify EF.  2. HTN - Well controlled. - GDMT as above.  3. Type 2 diabetes mellitus - hgb A1C 9.2 (9/23) - Continue Farxiga. - No change - Says blood sugars have been high, repeat A1C  4. CAD:  - mild nonobstructive CAD by cath 10/22 - No s/s angina - No ASA w/ Eliquis.  - Continue ? blocker. - On statin, Repatha and zetia -> lipids managed by PCP  5. PAF - Eliquis for a/c.  - NSR on ECG today - No bleeding on Eliquis - CBC today  Follow up in 4 months with Dr. Gala Romney  Jacklynn Ganong, FNP  8:01 AM

## 2023-03-23 ENCOUNTER — Telehealth (HOSPITAL_COMMUNITY): Payer: Self-pay

## 2023-03-23 ENCOUNTER — Encounter (HOSPITAL_COMMUNITY): Payer: Self-pay

## 2023-03-23 NOTE — Telephone Encounter (Signed)
Patient came in today with medications. Patient is taking.  Amlodipine 10mg  daily   Amitriptyline 25 mg daily Carvedilol 6.25mg  bid Donepezil 10mg  daily Ezetimibe 10 daily Lactulose 30 mLs prn Metoclopramide 10mg  bid Zofran 4mg  prn Pantoprazole 40 bid Rosuvastatin 40 Daily  Spironolactone 25mg  daily

## 2023-03-24 ENCOUNTER — Encounter: Payer: Self-pay | Admitting: Podiatry

## 2023-03-24 ENCOUNTER — Encounter (HOSPITAL_COMMUNITY): Payer: Self-pay

## 2023-03-24 ENCOUNTER — Ambulatory Visit (INDEPENDENT_AMBULATORY_CARE_PROVIDER_SITE_OTHER): Payer: Medicare HMO | Admitting: Podiatry

## 2023-03-24 DIAGNOSIS — M79675 Pain in left toe(s): Secondary | ICD-10-CM | POA: Diagnosis not present

## 2023-03-24 DIAGNOSIS — E1149 Type 2 diabetes mellitus with other diabetic neurological complication: Secondary | ICD-10-CM | POA: Diagnosis not present

## 2023-03-24 DIAGNOSIS — B351 Tinea unguium: Secondary | ICD-10-CM | POA: Diagnosis not present

## 2023-03-24 DIAGNOSIS — M79674 Pain in right toe(s): Secondary | ICD-10-CM | POA: Diagnosis not present

## 2023-03-24 MED ORDER — DAPAGLIFLOZIN PROPANEDIOL 10 MG PO TABS: 10.0000 mg | ORAL_TABLET | Freq: Every morning | ORAL | 3 refills | Status: DC

## 2023-03-24 MED ORDER — LOSARTAN POTASSIUM 25 MG PO TABS: 25.0000 mg | ORAL_TABLET | Freq: Every day | ORAL | 3 refills | Status: DC

## 2023-03-24 NOTE — Addendum Note (Signed)
Encounter addended by: Jacklynn Ganong, FNP on: 03/24/2023 8:02 AM  Actions taken: Order Reconciliation Section accessed, Home Medications modified, Medication taking status modified, Clinical Note Signed, Result note filed

## 2023-03-24 NOTE — Progress Notes (Signed)
This patient returns to my office for at risk foot care.  This patient requires this care by a professional since this patient will be at risk due to having CKD, diabetic neuropathy and coagulation defect due to eliquis. This patient is unable to cut nails himself since the patient cannot reach his nails.These nails are painful walking and wearing shoes.  This patient presents for at risk foot care today.  General Appearance  Alert, conversant and in no acute stress.  Vascular  Dorsalis pedis and posterior tibial  pulses are palpable  bilaterally.  Capillary return is within normal limits  bilaterally. Temperature is within normal limits  bilaterally.  Neurologic  Senn-Weinstein monofilament wire test within normal limits  bilaterally. Muscle power within normal limits bilaterally.  Nails Thick disfigured discolored nails with subungual debris  from hallux to fifth toes bilaterally. No evidence of bacterial infection or drainage bilaterally.  Orthopedic  No limitations of motion  feet .  No crepitus or effusions noted.  No bony pathology or digital deformities noted.  Skin  normotropic skin with no porokeratosis noted bilaterally.  No signs of infections or ulcers noted.     Onychomycosis  Pain in right toes  Pain in left toes  Consent was obtained for treatment procedures.   Mechanical debridement of nails 1-5  bilaterally performed with a nail nipper.  Filed with dremel without incident.    Return office visit    4 months                  Told patient to return for periodic foot care and evaluation due to potential at risk complications.   Helane Gunther DPM

## 2023-03-24 NOTE — Telephone Encounter (Signed)
Per Shanda Bumps. Notified to start taking Farxiga 10 mg daily and losartan 25 mg daily. Medication send to pharmacy. Patient voiced understanding.

## 2023-03-24 NOTE — Telephone Encounter (Signed)
Patient schedule for lab visit in 10 days.

## 2023-03-28 ENCOUNTER — Encounter (HOSPITAL_COMMUNITY): Payer: Self-pay

## 2023-04-03 ENCOUNTER — Telehealth (HOSPITAL_COMMUNITY): Payer: Self-pay

## 2023-04-03 ENCOUNTER — Ambulatory Visit (HOSPITAL_COMMUNITY)
Admission: RE | Admit: 2023-04-03 | Discharge: 2023-04-03 | Disposition: A | Discharge status: Home / Self Care | Payer: Medicare HMO | Source: Ambulatory Visit | Attending: Internal Medicine | Admitting: Internal Medicine

## 2023-04-03 DIAGNOSIS — I5022 Chronic systolic (congestive) heart failure: Secondary | ICD-10-CM | POA: Insufficient documentation

## 2023-04-03 LAB — BASIC METABOLIC PANEL
Anion gap: 10 (ref 5–15)
BUN: 10 mg/dL (ref 8–23)
CO2: 25 mmol/L (ref 22–32)
Calcium: 8.8 mg/dL — ABNORMAL LOW (ref 8.9–10.3)
Chloride: 101 mmol/L (ref 98–111)
Creatinine, Ser: 1.47 mg/dL — ABNORMAL HIGH (ref 0.61–1.24)
GFR, Estimated: 54 mL/min — ABNORMAL LOW (ref >60)
Glucose, Bld: 276 mg/dL — ABNORMAL HIGH (ref 70–99)
Potassium: 3.8 mmol/L (ref 3.5–5.1)
Sodium: 136 mmol/L (ref 135–145)

## 2023-04-03 NOTE — Telephone Encounter (Addendum)
Lab appointment scheduled for patient  ----- Message from [facility] sent at 04/03/2023  3:15 PM EDT ----- Kidney function up a bit from baseline. Repeat BMET in 7-10 days please

## 2023-04-05 ENCOUNTER — Encounter (INDEPENDENT_AMBULATORY_CARE_PROVIDER_SITE_OTHER): Payer: Medicare HMO | Admitting: Ophthalmology

## 2023-04-05 ENCOUNTER — Telehealth (HOSPITAL_COMMUNITY): Payer: Self-pay | Admitting: Cardiology

## 2023-04-05 ENCOUNTER — Encounter (INDEPENDENT_AMBULATORY_CARE_PROVIDER_SITE_OTHER): Payer: Self-pay

## 2023-04-05 DIAGNOSIS — I10 Essential (primary) hypertension: Secondary | ICD-10-CM

## 2023-04-05 DIAGNOSIS — H25813 Combined forms of age-related cataract, bilateral: Secondary | ICD-10-CM

## 2023-04-05 DIAGNOSIS — E113413 Type 2 diabetes mellitus with severe nonproliferative diabetic retinopathy with macular edema, bilateral: Secondary | ICD-10-CM

## 2023-04-05 DIAGNOSIS — H35033 Hypertensive retinopathy, bilateral: Secondary | ICD-10-CM

## 2023-04-05 NOTE — Telephone Encounter (Signed)
LMOM

## 2023-04-05 NOTE — Telephone Encounter (Signed)
Patient is scheduled for dental extractions x 4 on 10/4  Pt wanted to confirm ok to proceed with cardiac history and eliquis

## 2023-04-06 NOTE — Progress Notes (Signed)
[facility] - Clinic Note  04/12/2023     CHIEF COMPLAINT Patient presents for Retina Follow Up   HISTORY OF PRESENT ILLNESS: Calvin [name] is a 62 y.o. male who presents to the clinic today for:  HPI     Retina Follow Up   Patient presents with  Diabetic Retinopathy.  In both eyes.  This started 18 months ago.  Duration of 18 months.  Since onset it is gradually worsening.  I, the attending physician,  performed the HPI with the patient and updated documentation appropriately.        Comments   18 week retina follow up NPDR OU and IVA OU? pt is reporting everything is blurry he has noticed that its worse with light he is seeing floaters he denies any flashes of light last reading 147 this morning last A1C 12 last month pt had 10 teeth pulled same day last Friday he was give antibiotic and he did not take it      Last edited by Rennis Chris, MD on 04/12/2023 10:49 PM.    Pt states his blood sugar was running around 400, he states he had 10 teeth pulled and started drinking only water and now his blood sugar is around 110-115  Referring physician: Sharyn Dross MD [address] [place], [place] [address]  HISTORICAL INFORMATION:   Selected notes from the MEDICAL RECORD NUMBER Referred by Dr. Sharyn Dross LEE: 09/27/21 BCVA OD 20/80 OS 20/60-2 Ocular history: NPDR, Medical history: DM   CURRENT MEDICATIONS: No current outpatient medications on file. (Ophthalmic Drugs)   No current facility-administered medications for this visit. (Ophthalmic Drugs)   Current Outpatient Medications (Other)  Medication Sig   alum & mag hydroxide-simeth (MAALOX MAX) 400-400-40 MG/5ML suspension Take 15 mLs by mouth every 6 (six) hours as needed for indigestion.   amitriptyline (ELAVIL) 25 MG tablet Take 25 mg by mouth at bedtime.   amLODipine (NORVASC) 10 MG tablet Take 10 mg by mouth daily.   bisacodyl (DULCOLAX) 5 MG EC tablet Take 2 tablets (10 mg  total) by mouth every other day. Take at bedtime   Blood Glucose Monitoring Suppl (ONETOUCH VERIO FLEX SYSTEM) w/Device KIT 1 each by Other route daily.   Blood Pressure KIT Use as directed to check blood pressure twice daily.   carvedilol (COREG) 6.25 MG tablet Take 6.25 mg by mouth 2 (two) times daily with a meal.   dapagliflozin propanediol (FARXIGA) 10 MG TABS tablet Take 1 tablet (10 mg total) by mouth every morning.   docusate sodium (COLACE) 100 MG capsule Take 100 mg by mouth 2 (two) times daily.   donepezil (ARICEPT) 10 MG tablet TAKE ONE TABLET BY MOUTH EVERYDAY AT BEDTIME   ELIQUIS 5 MG TABS tablet TAKE ONE TABLET BY MOUTH EVERY MORNING and TAKE ONE TABLET BY MOUTH EVERYDAY AT BEDTIME   ezetimibe (ZETIA) 10 MG tablet TAKE ONE TABLET BY MOUTH EVERY MORNING   glucose blood (ONETOUCH VERIO) test strip 1 each by Other route 2 (two) times daily. And lancets 2/day   insulin degludec (TRESIBA FLEXTOUCH) 200 UNIT/ML FlexTouch Pen Inject 54 Units into the skin daily. And pen needles 1/day   insulin glargine (LANTUS) 100 UNIT/ML injection Inject 36 Units into the skin at bedtime.   insulin lispro (HUMALOG) 100 UNIT/ML injection Inject 16 Units into the skin 3 (three) times daily before meals.   Insulin Syringe-Needle U-100 (INSULIN SYRINGE .3CC/29GX1/2") 29G X 1/2" 0.3 ML MISC Inject 1  Syringe 3 (three) times daily as directed. Check blood sugar three times daily. Dx: E11.9   lactulose (CEPHULAC) 10 g packet Take 30 mLs by mouth as needed.   Lancets (ONETOUCH DELICA PLUS LANCET30G) MISC USE TWICE DAILY   losartan (COZAAR) 25 MG tablet Take 1 tablet (25 mg total) by mouth daily.   metFORMIN (GLUCOPHAGE) 1000 MG tablet Take 1,000 mg by mouth daily.   metoCLOPramide (REGLAN) 10 MG tablet Take 10 mg by mouth 2 (two) times daily.   nitroGLYCERIN (NITROSTAT) 0.4 MG SL tablet Place 1 tablet (0.4 mg total) under the tongue every 5 (five) minutes as needed for chest pain.   ondansetron (ZOFRAN-ODT) 4 MG  disintegrating tablet Take 1 tablet (4 mg total) by mouth every 8 (eight) hours as needed for nausea or vomiting.   pantoprazole (PROTONIX) 40 MG tablet TAKE ONE TABLET BY MOUTH EVERY MORNING and TAKE ONE TABLET BY MOUTH EVERYDAY AT BEDTIME   polyethylene glycol (MIRALAX / GLYCOLAX) 17 g packet Take 17 g by mouth 2 (two) times daily.   REPATHA 140 MG/ML SOSY INJECT 1 PEN INTO THE SKIN EVERY 14 DAYS (Patient not taking: Reported on 12/20/2022)   rosuvastatin (CRESTOR) 40 MG tablet Take 40 mg by mouth at bedtime.   sildenafil (VIAGRA) 100 MG tablet Take 1 tablet (100 mg total) by mouth daily as needed for erectile dysfunction.   spironolactone (ALDACTONE) 25 MG tablet TAKE ONE TABLET BY MOUTH EVERY MORNING   triamcinolone cream (KENALOG) 0.1 % Apply 1 Application topically daily.   No current facility-administered medications for this visit. (Other)   REVIEW OF SYSTEMS: ROS   Positive for: Gastrointestinal, Neurological, Musculoskeletal, Endocrine, Cardiovascular, Eyes, Respiratory Negative for: Constitutional, Skin, Genitourinary, HENT, Psychiatric, Allergic/Imm, Heme/Lymph Last edited by Etheleen Mayhew, COT on 04/12/2023  9:56 AM.     ALLERGIES No Known Allergies  PAST MEDICAL HISTORY Past Medical History:  Diagnosis Date   Adenoidal hypertrophy    Adrenal nodule 09/21/2020   AF (paroxysmal atrial fibrillation) 03/10/2018   AKI (acute kidney injury) 06/17/2019   Arthritis    Cataract    Mixed OU   Chronic anticoagulation 10/15/2019   Chronic systolic heart failure 11/22/2015   Coronary artery disease    a. cath in 08/2017 showing mild nonobstructive CAD with scattered 20-30% stenosis.    Diabetic neuropathy 09/26/2017   Diabetic retinopathy    NPDR OU   Essential hypertension 11/22/2015   Foot pain, bilateral 02/01/2019   Gastroesophageal reflux disease 05/10/2017   Heart attack    While living in [place]   History of adenomatous colonic polyps 05/19/2011   05/2011 9  adenomas 08/2017 5 adenomas (dimin) Recall 2022   History of COVID-19 06/18/2019   HLD (hyperlipidemia) 11/20/2015   Hypoglycemia due to insulin 10/15/2019   Insomnia 05/10/2017   Intermittent claudication 05/25/2020   Mild neurocognitive disorder due to Alzheimer's disease, possible 08/24/2021   Nonischemic cardiomyopathy    a. EF 20-25% by echo in 09/2017 with cath showing mild CAD. b.  Last echo 12/2017 EF 35-40%, grade 2 DD.   Obesity    Onychomycosis of multiple toenails with type 2 diabetes mellitus and peripheral neuropathy 05/10/2017   OSA on CPAP 05/10/2017   Right sided weakness 06/17/2019   Type II diabetes mellitus 11/20/2015   Unsteady gait 12/20/2018   Past Surgical History:  Procedure Laterality Date   BIOPSY  11/22/2021   Procedure: BIOPSY;  Surgeon: Hilarie Fredrickson, MD;  Location: [facility];  Service: Gastroenterology;;  COLONOSCOPY  05/13/11, 08/2017   9 adenomas   COLONOSCOPY WITH PROPOFOL N/A 11/22/2021   Procedure: COLONOSCOPY WITH PROPOFOL;  Surgeon: Hilarie Fredrickson, MD;  Location: [facility];  Service: Gastroenterology;  Laterality: N/A;   ESOPHAGOGASTRODUODENOSCOPY (EGD) WITH PROPOFOL N/A 11/22/2021   Procedure: ESOPHAGOGASTRODUODENOSCOPY (EGD) WITH PROPOFOL;  Surgeon: Hilarie Fredrickson, MD;  Location: [facility];  Service: Gastroenterology;  Laterality: N/A;   FOREARM SURGERY     MUSCLE BIOPSY     POLYPECTOMY     POLYPECTOMY  11/22/2021   Procedure: POLYPECTOMY;  Surgeon: Hilarie Fredrickson, MD;  Location: [facility];  Service: Gastroenterology;;   RIGHT/LEFT HEART CATH AND CORONARY ANGIOGRAPHY N/A 08/25/2017   Procedure: RIGHT/LEFT HEART CATH AND CORONARY ANGIOGRAPHY;  Surgeon: Kathleene Hazel, MD;  Location: [facility];  Service: Cardiovascular;  Laterality: N/A;   RIGHT/LEFT HEART CATH AND CORONARY ANGIOGRAPHY N/A 10/01/2019   Procedure: RIGHT/LEFT HEART CATH AND CORONARY ANGIOGRAPHY;  Surgeon: Dolores Patty, MD;  Location: [facility];   Service: Cardiovascular;  Laterality: N/A;   RIGHT/LEFT HEART CATH AND CORONARY ANGIOGRAPHY N/A 04/07/2021   Procedure: RIGHT/LEFT HEART CATH AND CORONARY ANGIOGRAPHY;  Surgeon: Dolores Patty, MD;  Location: [facility];  Service: Cardiovascular;  Laterality: N/A;   FAMILY HISTORY Family History  Problem Relation Age of Onset   Heart disease Mother    Heart attack Mother 39   Hypertension Mother    Hyperlipidemia Mother    Diabetes Mother    Heart disease Father    Heart attack Father 88   Hypertension Father    Hyperlipidemia Father    Diabetes Father    Heart attack Sister 26   Colon cancer Neg Hx    Stomach cancer Neg Hx    Esophageal cancer Neg Hx    Rectal cancer Neg Hx    Colon polyps Neg Hx    Lung disease Neg Hx    SOCIAL HISTORY Social History   Tobacco Use   Smoking status: Never   Smokeless tobacco: Never  Vaping Use   Vaping status: Never Used  Substance Use Topics   Alcohol use: No   Drug use: No       OPHTHALMIC EXAM:  Base Eye Exam     Visual Acuity (Snellen - Linear)       Right Left   Dist [place] 20/70 -2 20/60   Dist ph [place] NI NI         Tonometry (Tonopen, 10:06 AM)       Right Left   Pressure 18 15         Pupils       Pupils Dark Light Shape React APD   Right PERRL 3 2 Round Sluggish None   Left PERRL 3 2 Round Sluggish None         Visual Fields       Left Right    Full Full         Neuro/Psych     Oriented x3: Yes   Mood/Affect: Normal         Dilation     Both eyes: 2.5% Phenylephrine @ 10:06 AM           Slit Lamp and Fundus Exam     Slit Lamp Exam       Right Left   Lids/Lashes Dermatochalasis - upper lid, Meibomian gland dysfunction Dermatochalasis - upper lid, Meibomian gland dysfunction   Conjunctiva/Sclera nasal and temporal pinguecula,  Melanosis nasal and temporal pinguecula, Melanosis   Cornea arcus arcus, 2+ inferior Punctate epithelial erosions   Anterior Chamber deep, clear,  narrow temporal angle Deep and quiet   Iris Round and dilated, No NVI Round and dilated, No NVI   Lens 2-3+ Nuclear sclerosis, 2-3+ Cortical cataract 2-3+ Nuclear sclerosis, 2-3+ Cortical cataract   Anterior Vitreous Vitreous syneresis, mild Asteroid hyalosis Vitreous syneresis         Fundus Exam       Right Left   Disc Pink and Sharp, no NVD Pink and Sharp, no NVD, Compact   C/D Ratio 0.2 0.2   Macula Blunted foveal reflex, central edema - slightly improved, scattered MA/DBH -- improved, extensive exudates centrally Blunted foveal reflex, central edema with scattered exudates, scattered MA/DBH -- all slightly improved   Vessels attenuated, Tortuous, ST venule dilated attenuated, Tortuous   Periphery Attached, scattered MA/DBH, exudates greatest posteriorly Attached, scattered MA/DBH and exudates greatest posteriorly           IMAGING AND PROCEDURES  Imaging and Procedures for 04/12/2023  OCT, Retina - OU - Both Eyes       Right Eye Quality was good. Central Foveal Thickness: 372. Progression has improved. Findings include abnormal foveal contour, subretinal hyper-reflective material, intraretinal hyper-reflective material, intraretinal fluid, subretinal fluid, vitreomacular adhesion (Mild interval improvement in central edema with IRF/IRHM and +SRF).   Left Eye Quality was good. Central Foveal Thickness: 265. Progression has improved. Findings include no SRF, abnormal foveal contour, intraretinal hyper-reflective material, intraretinal fluid, subretinal fluid (interval improvement in IRF/SRF and foveal contour, +IRHM slightly increased).   Notes *Images captured and stored on drive  Diagnosis / Impression:  Central DME OU OD: Mild interval improvement in central edema with IRF/IRHM and +SRF OS: interval improvement in IRF/SRF and foveal contour, +IRHM slightly increased  Clinical management:  See below  Abbreviations: NFP - Normal foveal profile. CME - cystoid macular  edema. PED - pigment epithelial detachment. IRF - intraretinal fluid. SRF - subretinal fluid. EZ - ellipsoid zone. ERM - epiretinal membrane. ORA - outer retinal atrophy. ORT - outer retinal tubulation. SRHM - subretinal hyper-reflective material. IRHM - intraretinal hyper-reflective material      Intravitreal Injection, Pharmacologic Agent - OD - Right Eye       Time Out 04/12/2023. 11:22 AM. Confirmed correct patient, procedure, site, and patient consented.   Anesthesia Topical anesthesia was used. Anesthetic medications included Lidocaine 2%, Proparacaine 0.5%.   Procedure Preparation included 5% betadine to ocular surface, eyelid speculum. A (32g) needle was used.   Injection: 1.25 mg Bevacizumab 1.25mg /0.47ml   Route: Intravitreal, Site: Right Eye   NDC: P3213405, Lot: [id number], Expiration date: 05/28/2023   Post-op Post injection exam found visual acuity of at least counting fingers. The patient tolerated the procedure well. There were no complications. The patient received written and verbal post procedure care education. Post injection medications were not given.      Intravitreal Injection, Pharmacologic Agent - OS - Left Eye       Time Out 04/12/2023. 11:22 AM. Confirmed correct patient, procedure, site, and patient consented.   Anesthesia Topical anesthesia was used. Anesthetic medications included Lidocaine 2%, Proparacaine 0.5%.   Procedure Preparation included 5% betadine to ocular surface, eyelid speculum. A (32g) needle was used.   Injection: 1.25 mg Bevacizumab 1.25mg /0.22ml   Route: Intravitreal, Site: Left Eye   NDC: P3213405, Lot: [id number], Expiration date: 06/02/2023   Post-op Post injection exam found visual acuity of at least counting  fingers. The patient tolerated the procedure well. There were no complications. The patient received written and verbal post procedure care education. Post injection medications were not given.             ASSESSMENT/PLAN:    ICD-10-CM   1. Severe nonproliferative diabetic retinopathy of both eyes with macular edema associated with type 2 diabetes mellitus (HCC)  E11.3413 OCT, Retina - OU - Both Eyes    Intravitreal Injection, Pharmacologic Agent - OD - Right Eye    Intravitreal Injection, Pharmacologic Agent - OS - Left Eye    Bevacizumab (AVASTIN) SOLN 1.25 mg    Bevacizumab (AVASTIN) SOLN 1.25 mg    2. Essential hypertension  I10     3. Hypertensive retinopathy of both eyes  H35.033     4. Combined forms of age-related cataract of both eyes  H25.813      1. Severe Non-proliferative diabetic retinopathy w/ DME, OU  - pt delayed from 4 weeks to 5+ months (04.24.24 to 10.09.24) due to other medical procedures  - pt delayed from 4 weeks to 11 months (04.18.22 to 03.27.23) due to other health issues  - pt was delayed from 4 weeks to 9 weeks (12.14.21 to 2.21.22) due to financial / insurance reasons  - A1c 12.0 on 09.18.24, 10.4 on 03.15.23, 8.4 on 02.08.22; 10.1 in July 2021   - s/p IVA OS #1 (09.16.21), IVA OD #1 (09.21.21)             - s/p IVA OU #2 (10.19.21), #3 (11.16.21), #4 (12.14.21), #5 (02.21.22), #6 (03.21.22), #7 (04.18.22), #8 (03.27.23), #9 (04.24.23) - exam shows central edema OU, scattered IRH and severe exudates OU - FA (09.16.21) shows late leaking MA greatest posterior pole; no NV; +peripheral vascular perfusion defects -- may benefit from peripheral PRP OU  - OCT OD: Mild interval improvement in central edema with IRF/IRHM and +SRF; OS: interval improvement in IRF/SRF and foveal contour, +IRHM slightly increased at 5+ months - recommend IVA OU #10 today, 10.03.24 w/ f/u in 4 wks - pt wishes to proceed with injection - RBA of procedure discussed, questions answered - informed consent obtained and signed - see procedure note - Avastin informed consent obtained, signed and scanned, 04.24.23 (OU) - f/u 4 weeks, DFE, OCT, possible injection(s)  2,3. Hypertensive  retinopathy OU - discussed importance of tight BP control - monitor  4. Mixed Cataract OU - The symptoms of cataract, surgical options, and treatments and risks were discussed with patient. - discussed diagnosis and progression - now following with Dr. Zenaida Niece   Ophthalmic Meds Ordered this visit:  Meds ordered this encounter  Medications   Bevacizumab (AVASTIN) SOLN 1.25 mg   Bevacizumab (AVASTIN) SOLN 1.25 mg     Return in about 4 weeks (around 05/10/2023) for f/u NPDR OU, DFE, OCT.  There are no Patient Instructions on file for this visit.  This document serves as a record of services personally performed by Karie Chimera, MD, PhD. It was created on their behalf by De Blanch, an ophthalmic technician. The creation of this record is the provider's dictation and/or activities during the visit.    Electronically signed by: De Blanch, OA, 04/12/23  11:04 PM  This document serves as a record of services personally performed by Karie Chimera, MD, PhD. It was created on their behalf by Glee Arvin. Manson Passey, OA an ophthalmic technician. The creation of this record is the provider's dictation and/or activities during the visit.  Electronically signed by: Glee Arvin. Manson Passey, OA 04/12/23 11:04 PM  Karie Chimera, M.D., Ph.D. Diseases & Surgery of the Retina and Vitreous [facility]  I have reviewed the above documentation for accuracy and completeness, and I agree with the above. Karie Chimera, M.D., Ph.D. 04/12/23 11:12 PM   Abbreviations: M myopia (nearsighted); A astigmatism; H hyperopia (farsighted); P presbyopia; Mrx spectacle prescription;  CTL contact lenses; OD right eye; OS left eye; OU both eyes  XT exotropia; ET esotropia; PEK punctate epithelial keratitis; PEE punctate epithelial erosions; DES dry eye syndrome; MGD meibomian gland dysfunction; ATs artificial tears; PFAT's preservative free artificial tears; NSC nuclear sclerotic cataract; PSC  posterior subcapsular cataract; ERM epi-retinal membrane; PVD posterior vitreous detachment; RD retinal detachment; DM diabetes mellitus; DR diabetic retinopathy; NPDR non-proliferative diabetic retinopathy; PDR proliferative diabetic retinopathy; CSME clinically significant macular edema; DME diabetic macular edema; dbh dot blot hemorrhages; CWS cotton wool spot; POAG primary open angle glaucoma; C/D cup-to-disc ratio; HVF humphrey visual field; GVF goldmann visual field; OCT optical coherence tomography; IOP intraocular pressure; BRVO Branch retinal vein occlusion; CRVO central retinal vein occlusion; CRAO central retinal artery occlusion; BRAO branch retinal artery occlusion; RT retinal tear; SB scleral buckle; PPV pars plana vitrectomy; VH Vitreous hemorrhage; PRP panretinal laser photocoagulation; IVK intravitreal kenalog; VMT vitreomacular traction; MH Macular hole;  NVD neovascularization of the disc; NVE neovascularization elsewhere; AREDS age related eye disease study; ARMD age related macular degeneration; POAG primary open angle glaucoma; EBMD epithelial/anterior basement membrane dystrophy; ACIOL anterior chamber intraocular lens; IOL intraocular lens; PCIOL posterior chamber intraocular lens; Phaco/IOL phacoemulsification with intraocular lens placement; PRK photorefractive keratectomy; LASIK laser assisted in situ keratomileusis; HTN hypertension; DM diabetes mellitus; COPD chronic obstructive pulmonary disease

## 2023-04-10 ENCOUNTER — Other Ambulatory Visit (HOSPITAL_COMMUNITY): Payer: Medicare HMO

## 2023-04-12 ENCOUNTER — Ambulatory Visit (INDEPENDENT_AMBULATORY_CARE_PROVIDER_SITE_OTHER): Payer: Medicare HMO | Admitting: Ophthalmology

## 2023-04-12 ENCOUNTER — Encounter (INDEPENDENT_AMBULATORY_CARE_PROVIDER_SITE_OTHER): Payer: Self-pay | Admitting: Ophthalmology

## 2023-04-12 DIAGNOSIS — E113413 Type 2 diabetes mellitus with severe nonproliferative diabetic retinopathy with macular edema, bilateral: Secondary | ICD-10-CM

## 2023-04-12 DIAGNOSIS — I10 Essential (primary) hypertension: Secondary | ICD-10-CM | POA: Diagnosis not present

## 2023-04-12 DIAGNOSIS — H35033 Hypertensive retinopathy, bilateral: Secondary | ICD-10-CM

## 2023-04-12 DIAGNOSIS — H25813 Combined forms of age-related cataract, bilateral: Secondary | ICD-10-CM

## 2023-04-12 MED ORDER — BEVACIZUMAB CHEMO INJECTION 1.25MG/0.05ML SYRINGE FOR KALEIDOSCOPE
1.2500 mg | INTRAVITREAL | Status: AC | PRN
  Administered 2023-04-12: 1.25 mg via INTRAVITREAL

## 2023-04-24 ENCOUNTER — Telehealth: Payer: Self-pay

## 2023-04-24 NOTE — Telephone Encounter (Signed)
Caregiver called and left a message, asking about patient's diabetic shoe prescription?

## 2023-05-03 NOTE — Progress Notes (Signed)
[facility] - Clinic Note  05/10/2023     CHIEF COMPLAINT Patient presents for Retina Follow Up   HISTORY OF PRESENT ILLNESS: Calvin [name] is a 62 y.o. male who presents to the clinic today for:  HPI     Retina Follow Up   Patient presents with  Diabetic Retinopathy.  In both eyes.  This started 4 weeks ago.  I, the attending physician,  performed the HPI with the patient and updated documentation appropriately.        Comments   Patient here for 4 weeks retina follow up for NPDR OU. Patient states vision about the same. No eye pain. Not using drops.       Last edited by Rennis Chris, MD on 05/10/2023  1:17 PM.      Referring physician: Sharyn Dross MD [address] [place], [place] [address]  HISTORICAL INFORMATION:   Selected notes from the MEDICAL RECORD NUMBER Referred by Dr. Sharyn Dross LEE: 09/27/21 BCVA OD 20/80 OS 20/60-2 Ocular history: NPDR, Medical history: DM   CURRENT MEDICATIONS: No current outpatient medications on file. (Ophthalmic Drugs)   No current facility-administered medications for this visit. (Ophthalmic Drugs)   Current Outpatient Medications (Other)  Medication Sig   alum & mag hydroxide-simeth (MAALOX MAX) 400-400-40 MG/5ML suspension Take 15 mLs by mouth every 6 (six) hours as needed for indigestion.   amitriptyline (ELAVIL) 25 MG tablet Take 25 mg by mouth at bedtime.   amLODipine (NORVASC) 10 MG tablet Take 10 mg by mouth daily.   bisacodyl (DULCOLAX) 5 MG EC tablet Take 2 tablets (10 mg total) by mouth every other day. Take at bedtime   Blood Glucose Monitoring Suppl (ONETOUCH VERIO FLEX SYSTEM) w/Device KIT 1 each by Other route daily.   Blood Pressure KIT Use as directed to check blood pressure twice daily.   carvedilol (COREG) 6.25 MG tablet Take 6.25 mg by mouth 2 (two) times daily with a meal.   dapagliflozin propanediol (FARXIGA) 10 MG TABS tablet Take 1 tablet (10 mg total) by mouth every  morning.   docusate sodium (COLACE) 100 MG capsule Take 100 mg by mouth 2 (two) times daily.   donepezil (ARICEPT) 10 MG tablet TAKE ONE TABLET BY MOUTH EVERYDAY AT BEDTIME   ELIQUIS 5 MG TABS tablet TAKE ONE TABLET BY MOUTH EVERY MORNING and TAKE ONE TABLET BY MOUTH EVERYDAY AT BEDTIME   ezetimibe (ZETIA) 10 MG tablet TAKE ONE TABLET BY MOUTH EVERY MORNING   glucose blood (ONETOUCH VERIO) test strip 1 each by Other route 2 (two) times daily. And lancets 2/day   insulin degludec (TRESIBA FLEXTOUCH) 200 UNIT/ML FlexTouch Pen Inject 54 Units into the skin daily. And pen needles 1/day   insulin glargine (LANTUS) 100 UNIT/ML injection Inject 36 Units into the skin at bedtime.   insulin lispro (HUMALOG) 100 UNIT/ML injection Inject 16 Units into the skin 3 (three) times daily before meals.   Insulin Syringe-Needle U-100 (INSULIN SYRINGE .3CC/29GX1/2") 29G X 1/2" 0.3 ML MISC Inject 1 Syringe 3 (three) times daily as directed. Check blood sugar three times daily. Dx: E11.9   lactulose (CEPHULAC) 10 g packet Take 30 mLs by mouth as needed.   Lancets (ONETOUCH DELICA PLUS LANCET30G) MISC USE TWICE DAILY   losartan (COZAAR) 25 MG tablet Take 1 tablet (25 mg total) by mouth daily.   metFORMIN (GLUCOPHAGE) 1000 MG tablet Take 1,000 mg by mouth daily.   metoCLOPramide (REGLAN) 10 MG tablet Take 10  mg by mouth 2 (two) times daily.   nitroGLYCERIN (NITROSTAT) 0.4 MG SL tablet Place 1 tablet (0.4 mg total) under the tongue every 5 (five) minutes as needed for chest pain.   ondansetron (ZOFRAN-ODT) 4 MG disintegrating tablet Take 1 tablet (4 mg total) by mouth every 8 (eight) hours as needed for nausea or vomiting.   pantoprazole (PROTONIX) 40 MG tablet TAKE ONE TABLET BY MOUTH EVERY MORNING and TAKE ONE TABLET BY MOUTH EVERYDAY AT BEDTIME   polyethylene glycol (MIRALAX / GLYCOLAX) 17 g packet Take 17 g by mouth 2 (two) times daily.   rosuvastatin (CRESTOR) 40 MG tablet Take 40 mg by mouth at bedtime.    sildenafil (VIAGRA) 100 MG tablet Take 1 tablet (100 mg total) by mouth daily as needed for erectile dysfunction.   spironolactone (ALDACTONE) 25 MG tablet TAKE ONE TABLET BY MOUTH EVERY MORNING   triamcinolone cream (KENALOG) 0.1 % Apply 1 Application topically daily.   REPATHA 140 MG/ML SOSY INJECT 1 PEN INTO THE SKIN EVERY 14 DAYS (Patient not taking: Reported on 12/20/2022)   No current facility-administered medications for this visit. (Other)   REVIEW OF SYSTEMS: ROS   Positive for: Gastrointestinal, Neurological, Musculoskeletal, Endocrine, Cardiovascular, Eyes, Respiratory Negative for: Constitutional, Skin, Genitourinary, HENT, Psychiatric, Allergic/Imm, Heme/Lymph Last edited by Laddie Aquas, COA on 05/10/2023  9:23 AM.      ALLERGIES No Known Allergies  PAST MEDICAL HISTORY Past Medical History:  Diagnosis Date   Adenoidal hypertrophy    Adrenal nodule 09/21/2020   AF (paroxysmal atrial fibrillation) 03/10/2018   AKI (acute kidney injury) 06/17/2019   Arthritis    Cataract    Mixed OU   Chronic anticoagulation 10/15/2019   Chronic systolic heart failure 11/22/2015   Coronary artery disease    a. cath in 08/2017 showing mild nonobstructive CAD with scattered 20-30% stenosis.    Diabetic neuropathy 09/26/2017   Diabetic retinopathy    NPDR OU   Essential hypertension 11/22/2015   Foot pain, bilateral 02/01/2019   Gastroesophageal reflux disease 05/10/2017   Heart attack    While living in [place]   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 EF 20-25% by echo in 09/2017 with cath showing mild CAD. b.  Last echo 12/2017 EF  35-40%, grade 2 DD.   Obesity    Onychomycosis of multiple toenails with type 2 diabetes mellitus and peripheral neuropathy 05/10/2017   OSA on CPAP 05/10/2017   Right sided weakness 06/17/2019   Type II diabetes mellitus 11/20/2015   Unsteady gait 12/20/2018   Past Surgical History:  Procedure Laterality Date   BIOPSY  11/22/2021   Procedure: BIOPSY;  Surgeon: Hilarie Fredrickson, MD;  Location: [facility];  Service: Gastroenterology;;   COLONOSCOPY  05/13/11, 08/2017   9 adenomas   COLONOSCOPY WITH PROPOFOL N/A 11/22/2021   Procedure: COLONOSCOPY WITH PROPOFOL;  Surgeon: Hilarie Fredrickson, MD;  Location: [facility];  Service: Gastroenterology;  Laterality: N/A;   ESOPHAGOGASTRODUODENOSCOPY (EGD) WITH PROPOFOL N/A 11/22/2021   Procedure: ESOPHAGOGASTRODUODENOSCOPY (EGD) WITH PROPOFOL;  Surgeon: Hilarie Fredrickson, MD;  Location: [facility];  Service: Gastroenterology;  Laterality: N/A;   FOREARM SURGERY  MUSCLE BIOPSY     POLYPECTOMY     POLYPECTOMY  11/22/2021   Procedure: POLYPECTOMY;  Surgeon: Hilarie Fredrickson, MD;  Location: [facility];  Service: Gastroenterology;;   RIGHT/LEFT HEART CATH AND CORONARY ANGIOGRAPHY N/A 08/25/2017   Procedure: RIGHT/LEFT HEART CATH AND CORONARY ANGIOGRAPHY;  Surgeon: Kathleene Hazel, MD;  Location: [facility];  Service: Cardiovascular;  Laterality: N/A;   RIGHT/LEFT HEART CATH AND CORONARY ANGIOGRAPHY N/A 10/01/2019   Procedure: RIGHT/LEFT HEART CATH AND CORONARY ANGIOGRAPHY;  Surgeon: Dolores Patty, MD;  Location: [facility];  Service: Cardiovascular;  Laterality: N/A;   RIGHT/LEFT HEART CATH AND CORONARY ANGIOGRAPHY N/A 04/07/2021   Procedure: RIGHT/LEFT HEART CATH AND CORONARY ANGIOGRAPHY;  Surgeon: Dolores Patty, MD;  Location: [facility];  Service: Cardiovascular;  Laterality: N/A;   FAMILY HISTORY Family History  Problem Relation Age of Onset   Heart disease Mother    Heart attack Mother 56   Hypertension Mother     Hyperlipidemia Mother    Diabetes Mother    Heart disease Father    Heart attack Father 55   Hypertension Father    Hyperlipidemia Father    Diabetes Father    Heart attack Sister 71   Colon cancer Neg Hx    Stomach cancer Neg Hx    Esophageal cancer Neg Hx    Rectal cancer Neg Hx    Colon polyps Neg Hx    Lung disease Neg Hx    SOCIAL HISTORY Social History   Tobacco Use   Smoking status: Never   Smokeless tobacco: Never  Vaping Use   Vaping status: Never Used  Substance Use Topics   Alcohol use: No   Drug use: No       OPHTHALMIC EXAM:  Base Eye Exam     Visual Acuity (Snellen - Linear)       Right Left   Dist [place] 20/70 -1 20/50   Dist ph [place] NI NI         Tonometry (Tonopen, 9:22 AM)       Right Left   Pressure 19 17         Pupils       Dark Light Shape React APD   Right 3 2 Round Sluggish None   Left 3 2 Round Sluggish None         Visual Fields (Counting fingers)       Left Right    Full Full         Extraocular Movement       Right Left    Full, Ortho Full, Ortho         Neuro/Psych     Oriented x3: Yes   Mood/Affect: Normal         Dilation     Both eyes: 1.0% Mydriacyl, 2.5% Phenylephrine @ 9:21 AM           Slit Lamp and Fundus Exam     Slit Lamp Exam       Right Left   Lids/Lashes Dermatochalasis - upper lid, Meibomian gland dysfunction Dermatochalasis - upper lid, Meibomian gland dysfunction   Conjunctiva/Sclera nasal and temporal pinguecula, Melanosis nasal and temporal pinguecula, Melanosis   Cornea arcus arcus, 2+ inferior Punctate epithelial erosions   Anterior Chamber deep, clear, narrow temporal angle Deep and quiet   Iris Round and dilated, No NVI Round and dilated, No NVI   Lens 2-3+ Nuclear sclerosis, 2-3+ Cortical cataract  2-3+ Nuclear sclerosis, 2-3+ Cortical cataract   Anterior Vitreous Vitreous syneresis, mild Asteroid hyalosis Vitreous syneresis         Fundus Exam       Right Left    Disc Pink and Sharp, no NVD Pink and Sharp, no NVD, Compact   C/D Ratio 0.2 0.2   Macula Blunted foveal reflex, central edema - slightly improved, scattered MA/DBH -- improved, extensive exudates centrally Blunted foveal reflex, central edema with scattered exudates, scattered MA/DBH -- all slightly improved   Vessels attenuated, Tortuous, ST venule dilated attenuated, Tortuous   Periphery Attached, scattered MA/DBH, exudates greatest posteriorly Attached, scattered MA/DBH and exudates greatest posteriorly           IMAGING AND PROCEDURES  Imaging and Procedures for 05/10/2023  OCT, Retina - OU - Both Eyes       Right Eye Quality was good. Central Foveal Thickness: 309. Progression has improved. Findings include abnormal foveal contour, subretinal hyper-reflective material, intraretinal hyper-reflective material, intraretinal fluid, subretinal fluid, vitreomacular adhesion (Mild interval improvement in central edema with IRF/IRHM and +SRF).   Left Eye Quality was good. Central Foveal Thickness: 238. Progression has improved. Findings include no SRF, abnormal foveal contour, intraretinal hyper-reflective material, intraretinal fluid, subretinal fluid (Mild interval improvement in IRF/SRF and foveal contour).   Notes *Images captured and stored on drive  Diagnosis / Impression:  Central DME OU OD: Mild interval improvement in central edema with IRF/IRHM and +SRF OS: mild interval improvement in IRF/SRF and foveal contour  Clinical management:  See below  Abbreviations: NFP - Normal foveal profile. CME - cystoid macular edema. PED - pigment epithelial detachment. IRF - intraretinal fluid. SRF - subretinal fluid. EZ - ellipsoid zone. ERM - epiretinal membrane. ORA - outer retinal atrophy. ORT - outer retinal tubulation. SRHM - subretinal hyper-reflective material. IRHM - intraretinal hyper-reflective material      Intravitreal Injection, Pharmacologic Agent - OD - Right Eye        Time Out 05/10/2023. 10:02 AM. Confirmed correct patient, procedure, site, and patient consented.   Anesthesia Topical anesthesia was used. Anesthetic medications included Lidocaine 2%, Proparacaine 0.5%.   Procedure Preparation included 5% betadine to ocular surface, eyelid speculum. A (32g) needle was used.   Injection: 1.25 mg Bevacizumab 1.25mg /0.81ml   Route: Intravitreal, Site: Right Eye   NDC: P3213405, Lot: [id number], Expiration date: 07/29/2023   Post-op Post injection exam found visual acuity of at least counting fingers. The patient tolerated the procedure well. There were no complications. The patient received written and verbal post procedure care education. Post injection medications were not given.      Intravitreal Injection, Pharmacologic Agent - OS - Left Eye       Time Out 05/10/2023. 10:02 AM. Confirmed correct patient, procedure, site, and patient consented.   Anesthesia Topical anesthesia was used. Anesthetic medications included Lidocaine 2%, Proparacaine 0.5%.   Procedure Preparation included 5% betadine to ocular surface, eyelid speculum. A (32g) needle was used.   Injection: 1.25 mg Bevacizumab 1.25mg /0.65ml   Route: Intravitreal, Site: Left Eye   NDC: P3213405, Lot: [id number], Expiration date: 08/14/2023   Post-op Post injection exam found visual acuity of at least counting fingers. The patient tolerated the procedure well. There were no complications. The patient received written and verbal post procedure care education. Post injection medications were not given.            ASSESSMENT/PLAN:    ICD-10-CM   1. Severe nonproliferative diabetic retinopathy of both eyes with  macular edema associated with type 2 diabetes mellitus (HCC)  E11.3413 OCT, Retina - OU - Both Eyes    Intravitreal Injection, Pharmacologic Agent - OD - Right Eye    Intravitreal Injection, Pharmacologic Agent - OS - Left Eye    Bevacizumab (AVASTIN) SOLN 1.25 mg     Bevacizumab (AVASTIN) SOLN 1.25 mg    2. Essential hypertension  I10     3. Hypertensive retinopathy of both eyes  H35.033     4. Combined forms of age-related cataract of both eyes  H25.813      1. Severe Non-proliferative diabetic retinopathy w/ DME, OU  - pt delayed from 4 weeks to 5+ months (04.24.24 to 10.09.24) due to other medical procedures  - pt delayed from 4 weeks to 11 months (04.18.22 to 03.27.23) due to other health issues  - pt was delayed from 4 weeks to 9 weeks (12.14.21 to 2.21.22) due to financial / insurance reasons  - A1c 12.0 on 09.18.24, 10.4 on 03.15.23, 8.4 on 02.08.22; 10.1 in July 2021   - s/p IVA OS #1 (09.16.21), IVA OD #1 (09.21.21)             - s/p IVA OU #2 (10.19.21), #3 (11.16.21), #4 (12.14.21), #5 (02.21.22), #6 (03.21.22), #7 (04.18.22), #8 (03.27.23), #9 (04.24.23), #10 (10.03.24) - exam shows central edema OU, scattered IRH and severe exudates OU - FA (09.16.21) shows late leaking MA greatest posterior pole; no NV; +peripheral vascular perfusion defects -- may benefit from peripheral PRP OU  - OCT OD: Mild interval improvement in central edema with IRF/IRHM and +SRF; OS: interval improvement in IRF/SRF and foveal contour at 4 weeks - recommend IVA OU #11 today, 11.06.24 w/ f/u in 4 wks - pt wishes to proceed with injection - RBA of procedure discussed, questions answered - informed consent obtained and signed - see procedure note - Avastin informed consent obtained, signed and scanned, 04.24.23 (OU) - f/u 4 weeks, DFE, OCT, possible injection(s)  2,3. Hypertensive retinopathy OU - discussed importance of tight BP control - monitor  4. Mixed Cataract OU - The symptoms of cataract, surgical options, and treatments and risks were discussed with patient. - discussed diagnosis and progression - now following with Dr. Zenaida Niece   Ophthalmic Meds Ordered this visit:  Meds ordered this encounter  Medications   Bevacizumab (AVASTIN) SOLN 1.25 mg    Bevacizumab (AVASTIN) SOLN 1.25 mg     Return in about 4 weeks (around 06/07/2023) for f/u NPDR OU, DFE, OCT.  There are no Patient Instructions on file for this visit.  This document serves as a record of services personally performed by Karie Chimera, MD, PhD. It was created on their behalf by De Blanch, an ophthalmic technician. The creation of this record is the provider's dictation and/or activities during the visit.    Electronically signed by: De Blanch, OA, 05/11/23  2:28 PM  This document serves as a record of services personally performed by Karie Chimera, MD, PhD. It was created on their behalf by Glee Arvin. Manson Passey, OA an ophthalmic technician. The creation of this record is the provider's dictation and/or activities during the visit.    Electronically signed by: Glee Arvin. Manson Passey, OA 05/11/23 2:28 PM   Karie Chimera, M.D., Ph.D. Diseases & Surgery of the Retina and Vitreous [facility]  I have reviewed the above documentation for accuracy and completeness, and I agree with the above. Karie Chimera, M.D., Ph.D. 05/11/23 2:31  PM   Abbreviations: M myopia (nearsighted); A astigmatism; H hyperopia (farsighted); P presbyopia; Mrx spectacle prescription;  CTL contact lenses; OD right eye; OS left eye; OU both eyes  XT exotropia; ET esotropia; PEK punctate epithelial keratitis; PEE punctate epithelial erosions; DES dry eye syndrome; MGD meibomian gland dysfunction; ATs artificial tears; PFAT's preservative free artificial tears; NSC nuclear sclerotic cataract; PSC posterior subcapsular cataract; ERM epi-retinal membrane; PVD posterior vitreous detachment; RD retinal detachment; DM diabetes mellitus; DR diabetic retinopathy; NPDR non-proliferative diabetic retinopathy; PDR proliferative diabetic retinopathy; CSME clinically significant macular edema; DME diabetic macular edema; dbh dot blot hemorrhages; CWS cotton wool spot; POAG primary open angle  glaucoma; C/D cup-to-disc ratio; HVF humphrey visual field; GVF goldmann visual field; OCT optical coherence tomography; IOP intraocular pressure; BRVO Branch retinal vein occlusion; CRVO central retinal vein occlusion; CRAO central retinal artery occlusion; BRAO branch retinal artery occlusion; RT retinal tear; SB scleral buckle; PPV pars plana vitrectomy; VH Vitreous hemorrhage; PRP panretinal laser photocoagulation; IVK intravitreal kenalog; VMT vitreomacular traction; MH Macular hole;  NVD neovascularization of the disc; NVE neovascularization elsewhere; AREDS age related eye disease study; ARMD age related macular degeneration; POAG primary open angle glaucoma; EBMD epithelial/anterior basement membrane dystrophy; ACIOL anterior chamber intraocular lens; IOL intraocular lens; PCIOL posterior chamber intraocular lens; Phaco/IOL phacoemulsification with intraocular lens placement; PRK photorefractive keratectomy; LASIK laser assisted in situ keratomileusis; HTN hypertension; DM diabetes mellitus; COPD chronic obstructive pulmonary disease

## 2023-05-05 ENCOUNTER — Ambulatory Visit (HOSPITAL_COMMUNITY)
Admission: RE | Admit: 2023-05-05 | Discharge: 2023-05-05 | Disposition: A | Discharge status: Home / Self Care | Payer: Medicare HMO | Source: Ambulatory Visit | Attending: Internal Medicine | Admitting: Internal Medicine

## 2023-05-05 DIAGNOSIS — I5022 Chronic systolic (congestive) heart failure: Secondary | ICD-10-CM | POA: Diagnosis present

## 2023-05-05 LAB — BASIC METABOLIC PANEL
Anion gap: 7 (ref 5–15)
BUN: 19 mg/dL (ref 8–23)
CO2: 25 mmol/L (ref 22–32)
Calcium: 8.8 mg/dL — ABNORMAL LOW (ref 8.9–10.3)
Chloride: 102 mmol/L (ref 98–111)
Creatinine, Ser: 1.44 mg/dL — ABNORMAL HIGH (ref 0.61–1.24)
GFR, Estimated: 55 mL/min — ABNORMAL LOW (ref >60)
Glucose, Bld: 278 mg/dL — ABNORMAL HIGH (ref 70–99)
Potassium: 4.1 mmol/L (ref 3.5–5.1)
Sodium: 134 mmol/L — ABNORMAL LOW (ref 135–145)

## 2023-05-10 ENCOUNTER — Ambulatory Visit (INDEPENDENT_AMBULATORY_CARE_PROVIDER_SITE_OTHER): Payer: Medicare HMO | Admitting: Ophthalmology

## 2023-05-10 ENCOUNTER — Encounter (INDEPENDENT_AMBULATORY_CARE_PROVIDER_SITE_OTHER): Payer: Self-pay | Admitting: Ophthalmology

## 2023-05-10 DIAGNOSIS — I10 Essential (primary) hypertension: Secondary | ICD-10-CM

## 2023-05-10 DIAGNOSIS — H35033 Hypertensive retinopathy, bilateral: Secondary | ICD-10-CM | POA: Diagnosis not present

## 2023-05-10 DIAGNOSIS — E113413 Type 2 diabetes mellitus with severe nonproliferative diabetic retinopathy with macular edema, bilateral: Secondary | ICD-10-CM | POA: Diagnosis not present

## 2023-05-10 DIAGNOSIS — H25813 Combined forms of age-related cataract, bilateral: Secondary | ICD-10-CM

## 2023-05-10 MED ORDER — BEVACIZUMAB CHEMO INJECTION 1.25MG/0.05ML SYRINGE FOR KALEIDOSCOPE
1.2500 mg | INTRAVITREAL | Status: AC | PRN
  Administered 2023-05-10: 1.25 mg via INTRAVITREAL

## 2023-05-30 NOTE — Progress Notes (Shared)
[facility] - Clinic Note  06/07/2023     CHIEF COMPLAINT Patient presents for No chief complaint on file.   HISTORY OF PRESENT ILLNESS: Calvin [name] is a 62 y.o. male who presents to the clinic today for:     Referring physician: Sharyn Dross MD [address], [address] [place], [place] [address]  HISTORICAL INFORMATION:   Selected notes from the MEDICAL RECORD NUMBER Referred by Dr. Sharyn Dross LEE: 09/27/21 BCVA OD 20/80 OS 20/60-2 Ocular history: NPDR, Medical history: DM   CURRENT MEDICATIONS: No current outpatient medications on file. (Ophthalmic Drugs)   No current facility-administered medications for this visit. (Ophthalmic Drugs)   Current Outpatient Medications (Other)  Medication Sig   alum & mag hydroxide-simeth (MAALOX MAX) 400-400-40 MG/5ML suspension Take 15 mLs by mouth every 6 (six) hours as needed for indigestion.   amitriptyline (ELAVIL) 25 MG tablet Take 25 mg by mouth at bedtime.   amLODipine (NORVASC) 10 MG tablet Take 10 mg by mouth daily.   bisacodyl (DULCOLAX) 5 MG EC tablet Take 2 tablets (10 mg total) by mouth every other day. Take at bedtime   Blood Glucose Monitoring Suppl (ONETOUCH VERIO FLEX SYSTEM) w/Device KIT 1 each by Other route daily.   Blood Pressure KIT Use as directed to check blood pressure twice daily.   carvedilol (COREG) 6.25 MG tablet Take 6.25 mg by mouth 2 (two) times daily with a meal.   dapagliflozin propanediol (FARXIGA) 10 MG TABS tablet Take 1 tablet (10 mg total) by mouth every morning.   docusate sodium (COLACE) 100 MG capsule Take 100 mg by mouth 2 (two) times daily.   donepezil (ARICEPT) 10 MG tablet TAKE ONE TABLET BY MOUTH EVERYDAY AT BEDTIME   ELIQUIS 5 MG TABS tablet TAKE ONE TABLET BY MOUTH EVERY MORNING and TAKE ONE TABLET BY MOUTH EVERYDAY AT BEDTIME   ezetimibe (ZETIA) 10 MG tablet TAKE ONE TABLET BY MOUTH EVERY MORNING   glucose blood (ONETOUCH VERIO) test strip 1 each by Other  route 2 (two) times daily. And lancets 2/day   insulin degludec (TRESIBA FLEXTOUCH) 200 UNIT/ML FlexTouch Pen Inject 54 Units into the skin daily. And pen needles 1/day   insulin glargine (LANTUS) 100 UNIT/ML injection Inject 36 Units into the skin at bedtime.   insulin lispro (HUMALOG) 100 UNIT/ML injection Inject 16 Units into the skin 3 (three) times daily before meals.   Insulin Syringe-Needle U-100 (INSULIN SYRINGE .3CC/29GX1/2") 29G X 1/2" 0.3 ML MISC Inject 1 Syringe 3 (three) times daily as directed. Check blood sugar three times daily. Dx: E11.9   lactulose (CEPHULAC) 10 g packet Take 30 mLs by mouth as needed.   Lancets (ONETOUCH DELICA PLUS LANCET30G) MISC USE TWICE DAILY   losartan (COZAAR) 25 MG tablet Take 1 tablet (25 mg total) by mouth daily.   metFORMIN (GLUCOPHAGE) 1000 MG tablet Take 1,000 mg by mouth daily.   metoCLOPramide (REGLAN) 10 MG tablet Take 10 mg by mouth 2 (two) times daily.   nitroGLYCERIN (NITROSTAT) 0.4 MG SL tablet Place 1 tablet (0.4 mg total) under the tongue every 5 (five) minutes as needed for chest pain.   ondansetron (ZOFRAN-ODT) 4 MG disintegrating tablet Take 1 tablet (4 mg total) by mouth every 8 (eight) hours as needed for nausea or vomiting.   pantoprazole (PROTONIX) 40 MG tablet TAKE ONE TABLET BY MOUTH EVERY MORNING and TAKE ONE TABLET BY MOUTH EVERYDAY AT BEDTIME   polyethylene glycol (MIRALAX / GLYCOLAX) 17 g packet  Take 17 g by mouth 2 (two) times daily.   REPATHA 140 MG/ML SOSY INJECT 1 PEN INTO THE SKIN EVERY 14 DAYS (Patient not taking: Reported on 12/20/2022)   rosuvastatin (CRESTOR) 40 MG tablet Take 40 mg by mouth at bedtime.   sildenafil (VIAGRA) 100 MG tablet Take 1 tablet (100 mg total) by mouth daily as needed for erectile dysfunction.   spironolactone (ALDACTONE) 25 MG tablet TAKE ONE TABLET BY MOUTH EVERY MORNING   triamcinolone cream (KENALOG) 0.1 % Apply 1 Application topically daily.   No current facility-administered medications  for this visit. (Other)   REVIEW OF SYSTEMS:    ALLERGIES No Known Allergies  PAST MEDICAL HISTORY Past Medical History:  Diagnosis Date   Adenoidal hypertrophy    Adrenal nodule 09/21/2020   AF (paroxysmal atrial fibrillation) 03/10/2018   AKI (acute kidney injury) 06/17/2019   Arthritis    Cataract    Mixed OU   Chronic anticoagulation 10/15/2019   Chronic systolic heart failure 11/22/2015   Coronary artery disease    a. cath in 08/2017 showing mild nonobstructive CAD with scattered 20-30% stenosis.    Diabetic neuropathy 09/26/2017   Diabetic retinopathy    NPDR OU   Essential hypertension 11/22/2015   Foot pain, bilateral 02/01/2019   Gastroesophageal reflux disease 05/10/2017   Heart attack    While living in [place]   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 EF 20-25% by echo in 09/2017 with cath showing mild CAD. b.  Last echo 12/2017 EF 35-40%, grade 2 DD.   Obesity    Onychomycosis of multiple toenails with type 2 diabetes mellitus and peripheral neuropathy 05/10/2017   OSA on CPAP 05/10/2017   Right sided weakness 06/17/2019   Type II diabetes mellitus 11/20/2015   Unsteady gait 12/20/2018   Past Surgical History:  Procedure Laterality Date   BIOPSY  11/22/2021   Procedure: BIOPSY;  Surgeon: Hilarie Fredrickson, MD;  Location: [facility];  Service: Gastroenterology;;   COLONOSCOPY  05/13/11, 08/2017   9 adenomas   COLONOSCOPY WITH PROPOFOL N/A 11/22/2021   Procedure: COLONOSCOPY WITH PROPOFOL;  Surgeon: Hilarie Fredrickson, MD;  Location: [facility];  Service: Gastroenterology;  Laterality: N/A;   ESOPHAGOGASTRODUODENOSCOPY  (EGD) WITH PROPOFOL N/A 11/22/2021   Procedure: ESOPHAGOGASTRODUODENOSCOPY (EGD) WITH PROPOFOL;  Surgeon: Hilarie Fredrickson, MD;  Location: [facility];  Service: Gastroenterology;  Laterality: N/A;   FOREARM SURGERY     MUSCLE BIOPSY     POLYPECTOMY     POLYPECTOMY  11/22/2021   Procedure: POLYPECTOMY;  Surgeon: Hilarie Fredrickson, MD;  Location: [facility];  Service: Gastroenterology;;   RIGHT/LEFT HEART CATH AND CORONARY ANGIOGRAPHY N/A 08/25/2017   Procedure: RIGHT/LEFT HEART CATH AND CORONARY ANGIOGRAPHY;  Surgeon: Kathleene Hazel, MD;  Location: [facility];  Service: Cardiovascular;  Laterality: N/A;   RIGHT/LEFT HEART CATH AND CORONARY ANGIOGRAPHY N/A 10/01/2019   Procedure: RIGHT/LEFT HEART CATH AND CORONARY ANGIOGRAPHY;  Surgeon: Dolores Patty, MD;  Location: [facility];  Service: Cardiovascular;  Laterality: N/A;   RIGHT/LEFT HEART CATH AND CORONARY ANGIOGRAPHY N/A 04/07/2021   Procedure: RIGHT/LEFT HEART CATH AND CORONARY ANGIOGRAPHY;  Surgeon:  [name], Bevelyn Buckles, MD;  Location: [facility];  Service: Cardiovascular;  Laterality: N/A;   FAMILY HISTORY Family History  Problem Relation Age of Onset   Heart disease Mother    Heart attack Mother 31   Hypertension Mother    Hyperlipidemia Mother    Diabetes Mother    Heart disease Father    Heart attack Father 51   Hypertension Father    Hyperlipidemia Father    Diabetes Father    Heart attack Sister 83   Colon cancer Neg Hx    Stomach cancer Neg Hx    Esophageal cancer Neg Hx    Rectal cancer Neg Hx    Colon polyps Neg Hx    Lung disease Neg Hx    SOCIAL HISTORY Social History   Tobacco Use   Smoking status: Never   Smokeless tobacco: Never  Vaping Use   Vaping status: Never Used  Substance Use Topics   Alcohol use: No   Drug use: No       OPHTHALMIC EXAM:  Not recorded    IMAGING AND PROCEDURES  Imaging and Procedures for 06/07/2023          ASSESSMENT/PLAN:  No  diagnosis found.  1. Severe Non-proliferative diabetic retinopathy w/ DME, OU  - pt delayed from 4 weeks to 5+ months (04.24.24 to 10.09.24) due to other medical procedures  - pt delayed from 4 weeks to 11 months (04.18.22 to 03.27.23) due to other health issues  - pt was delayed from 4 weeks to 9 weeks (12.14.21 to 2.21.22) due to financial / insurance reasons  - A1c 12.0 on 09.18.24, 10.4 on 03.15.23, 8.4 on 02.08.22; 10.1 in July 2021   - s/p IVA OS #1 (09.16.21), IVA OD #1 (09.21.21)             - s/p IVA OU #2 (10.19.21), #3 (11.16.21), #4 (12.14.21), #5 (02.21.22), #6 (03.21.22), #7 (04.18.22), #8 (03.27.23), #9 (04.24.23), #10 (10.03.24), #11 (11.06.24) - exam shows central edema OU, scattered IRH and severe exudates OU - FA (09.16.21) shows late leaking MA greatest posterior pole; no NV; +peripheral vascular perfusion defects -- may benefit from peripheral PRP OU  - OCT OD: Mild interval improvement in central edema with IRF/IRHM and +SRF; OS: interval improvement in IRF/SRF and foveal contour at 4 weeks - recommend IVA OU #12 today, 12.04.24 w/ f/u in 4 wks - pt wishes to proceed with injection - RBA of procedure discussed, questions answered - informed consent obtained and signed - see procedure note - Avastin informed consent obtained, signed and scanned, 04.24.23 (OU) - f/u 4 weeks, DFE, OCT, possible injection(s)  2,3. Hypertensive retinopathy OU - discussed importance of tight BP control - monitor  4. Mixed Cataract OU - The symptoms of cataract, surgical options, and treatments and risks were discussed with patient. - discussed diagnosis and progression - now following with Dr. Zenaida Niece   Ophthalmic Meds Ordered this visit:  No orders of the defined types were placed in this encounter.    No follow-ups on file.  There are no Patient Instructions on file for this visit.  This document serves as a record of services personally performed by Karie Chimera, MD, PhD. It  was created on their behalf by De Blanch, an ophthalmic technician. The creation of this record is the provider's dictation and/or activities during the visit.    Electronically signed by: De Blanch, OA, 05/30/23  9:34 AM     Karie Chimera,  M.D., Ph.D. Diseases & Surgery of the Retina and Vitreous [facility]    Abbreviations: M myopia (nearsighted); A astigmatism; H hyperopia (farsighted); P presbyopia; Mrx spectacle prescription;  CTL contact lenses; OD right eye; OS left eye; OU both eyes  XT exotropia; ET esotropia; PEK punctate epithelial keratitis; PEE punctate epithelial erosions; DES dry eye syndrome; MGD meibomian gland dysfunction; ATs artificial tears; PFAT's preservative free artificial tears; NSC nuclear sclerotic cataract; PSC posterior subcapsular cataract; ERM epi-retinal membrane; PVD posterior vitreous detachment; RD retinal detachment; DM diabetes mellitus; DR diabetic retinopathy; NPDR non-proliferative diabetic retinopathy; PDR proliferative diabetic retinopathy; CSME clinically significant macular edema; DME diabetic macular edema; dbh dot blot hemorrhages; CWS cotton wool spot; POAG primary open angle glaucoma; C/D cup-to-disc ratio; HVF humphrey visual field; GVF goldmann visual field; OCT optical coherence tomography; IOP intraocular pressure; BRVO Branch retinal vein occlusion; CRVO central retinal vein occlusion; CRAO central retinal artery occlusion; BRAO branch retinal artery occlusion; RT retinal tear; SB scleral buckle; PPV pars plana vitrectomy; VH Vitreous hemorrhage; PRP panretinal laser photocoagulation; IVK intravitreal kenalog; VMT vitreomacular traction; MH Macular hole;  NVD neovascularization of the disc; NVE neovascularization elsewhere; AREDS age related eye disease study; ARMD age related macular degeneration; POAG primary open angle glaucoma; EBMD epithelial/anterior basement membrane dystrophy; ACIOL anterior chamber  intraocular lens; IOL intraocular lens; PCIOL posterior chamber intraocular lens; Phaco/IOL phacoemulsification with intraocular lens placement; PRK photorefractive keratectomy; LASIK laser assisted in situ keratomileusis; HTN hypertension; DM diabetes mellitus; COPD chronic obstructive pulmonary disease

## 2023-06-07 ENCOUNTER — Encounter (INDEPENDENT_AMBULATORY_CARE_PROVIDER_SITE_OTHER): Payer: Medicare HMO | Admitting: Ophthalmology

## 2023-06-07 DIAGNOSIS — I10 Essential (primary) hypertension: Secondary | ICD-10-CM

## 2023-06-07 DIAGNOSIS — H25813 Combined forms of age-related cataract, bilateral: Secondary | ICD-10-CM

## 2023-06-07 DIAGNOSIS — E113413 Type 2 diabetes mellitus with severe nonproliferative diabetic retinopathy with macular edema, bilateral: Secondary | ICD-10-CM

## 2023-06-07 DIAGNOSIS — H35033 Hypertensive retinopathy, bilateral: Secondary | ICD-10-CM

## 2023-07-03 ENCOUNTER — Telehealth: Payer: Self-pay | Admitting: *Deleted

## 2023-07-03 NOTE — Telephone Encounter (Signed)
Called patient to give him lab results for research study he is enrolled in. Left message for patient  to call back.     [place] Lacrystal Barbe, Research Coordinator 07/03/2023  16:23 pm

## 2023-07-22 LAB — LAB REPORT - SCANNED
A1c: 12.8
EGFR: 64

## 2023-07-27 NOTE — Progress Notes (Signed)
[facility] - Clinic Note  08/02/2023     CHIEF COMPLAINT Patient presents for Retina Follow Up   HISTORY OF PRESENT ILLNESS: Calvin [name] is a 63 y.o. male who presents to the clinic today for:  HPI     Retina Follow Up   Patient presents with  Diabetic Retinopathy.  In both eyes.  This started 12 weeks ago.  Duration of 12 weeks.  I, the attending physician,  performed the HPI with the patient and updated documentation appropriately.        Comments   Patient feels the vision is worse, the vision is blurry. He is not using eye drops. His blood sugar was 240.      Last edited by Rennis Chris, MD on 08/02/2023 12:18 PM.    Pt is delayed to follow up from 4 weeks to 12 weeks due to being in the hospital, he states his vision is the same   Referring physician: Sharyn Dross MD [address], [address] [place], [place] [address]  HISTORICAL INFORMATION:   Selected notes from the MEDICAL RECORD NUMBER Referred by Dr. Sharyn Dross LEE: 09/27/21 BCVA OD 20/80 OS 20/60-2 Ocular history: NPDR, Medical history: DM   CURRENT MEDICATIONS: No current outpatient medications on file. (Ophthalmic Drugs)   No current facility-administered medications for this visit. (Ophthalmic Drugs)   Current Outpatient Medications (Other)  Medication Sig   alum & mag hydroxide-simeth (MAALOX MAX) 400-400-40 MG/5ML suspension Take 15 mLs by mouth every 6 (six) hours as needed for indigestion.   amitriptyline (ELAVIL) 25 MG tablet Take 25 mg by mouth at bedtime.   amLODipine (NORVASC) 10 MG tablet Take 10 mg by mouth daily.   bisacodyl (DULCOLAX) 5 MG EC tablet Take 2 tablets (10 mg total) by mouth every other day. Take at bedtime   Blood Glucose Monitoring Suppl (ONETOUCH VERIO FLEX SYSTEM) w/Device KIT 1 each by Other route daily.   Blood Pressure KIT Use as directed to check blood pressure twice daily.   carvedilol (COREG) 6.25 MG tablet Take 6.25 mg by mouth 2 (two)  times daily with a meal.   dapagliflozin propanediol (FARXIGA) 10 MG TABS tablet Take 1 tablet (10 mg total) by mouth every morning.   docusate sodium (COLACE) 100 MG capsule Take 100 mg by mouth 2 (two) times daily.   donepezil (ARICEPT) 10 MG tablet TAKE ONE TABLET BY MOUTH EVERYDAY AT BEDTIME   ELIQUIS 5 MG TABS tablet TAKE ONE TABLET BY MOUTH EVERY MORNING and TAKE ONE TABLET BY MOUTH EVERYDAY AT BEDTIME   ezetimibe (ZETIA) 10 MG tablet TAKE ONE TABLET BY MOUTH EVERY MORNING   glucose blood (ONETOUCH VERIO) test strip 1 each by Other route 2 (two) times daily. And lancets 2/day   insulin degludec (TRESIBA FLEXTOUCH) 200 UNIT/ML FlexTouch Pen Inject 54 Units into the skin daily. And pen needles 1/day   insulin glargine (LANTUS) 100 UNIT/ML injection Inject 36 Units into the skin at bedtime.   insulin lispro (HUMALOG) 100 UNIT/ML injection Inject 16 Units into the skin 3 (three) times daily before meals.   Insulin Syringe-Needle U-100 (INSULIN SYRINGE .3CC/29GX1/2") 29G X 1/2" 0.3 ML MISC Inject 1 Syringe 3 (three) times daily as directed. Check blood sugar three times daily. Dx: E11.9   lactulose (CEPHULAC) 10 g packet Take 30 mLs by mouth as needed.   Lancets (ONETOUCH DELICA PLUS LANCET30G) MISC USE TWICE DAILY   losartan (COZAAR) 25 MG tablet Take 1 tablet (25 mg  total) by mouth daily.   metFORMIN (GLUCOPHAGE) 1000 MG tablet Take 1,000 mg by mouth daily.   metoCLOPramide (REGLAN) 10 MG tablet Take 10 mg by mouth 2 (two) times daily.   nitroGLYCERIN (NITROSTAT) 0.4 MG SL tablet Place 1 tablet (0.4 mg total) under the tongue every 5 (five) minutes as needed for chest pain.   ondansetron (ZOFRAN-ODT) 4 MG disintegrating tablet Take 1 tablet (4 mg total) by mouth every 8 (eight) hours as needed for nausea or vomiting.   pantoprazole (PROTONIX) 40 MG tablet TAKE ONE TABLET BY MOUTH EVERY MORNING and TAKE ONE TABLET BY MOUTH EVERYDAY AT BEDTIME   polyethylene glycol (MIRALAX / GLYCOLAX) 17 g  packet Take 17 g by mouth 2 (two) times daily.   REPATHA 140 MG/ML SOSY INJECT 1 PEN INTO THE SKIN EVERY 14 DAYS   rosuvastatin (CRESTOR) 40 MG tablet Take 40 mg by mouth at bedtime.   sildenafil (VIAGRA) 100 MG tablet Take 1 tablet (100 mg total) by mouth daily as needed for erectile dysfunction.   spironolactone (ALDACTONE) 25 MG tablet TAKE ONE TABLET BY MOUTH EVERY MORNING   triamcinolone cream (KENALOG) 0.1 % Apply 1 Application topically daily.   No current facility-administered medications for this visit. (Other)   REVIEW OF SYSTEMS: ROS   Positive for: Gastrointestinal, Neurological, Musculoskeletal, Endocrine, Cardiovascular, Eyes, Respiratory Negative for: Constitutional, Skin, Genitourinary, HENT, Psychiatric, Allergic/Imm, Heme/Lymph Last edited by Charlette Caffey, COT on 08/02/2023  8:36 AM.     ALLERGIES No Known Allergies  PAST MEDICAL HISTORY Past Medical History:  Diagnosis Date   Adenoidal hypertrophy    Adrenal nodule 09/21/2020   AF (paroxysmal atrial fibrillation) 03/10/2018   AKI (acute kidney injury) 06/17/2019   Arthritis    Cataract    Mixed OU   Chronic anticoagulation 10/15/2019   Chronic systolic heart failure 11/22/2015   Coronary artery disease    a. cath in 08/2017 showing mild nonobstructive CAD with scattered 20-30% stenosis.    Diabetic neuropathy 09/26/2017   Diabetic retinopathy    NPDR OU   Essential hypertension 11/22/2015   Foot pain, bilateral 02/01/2019   Gastroesophageal reflux disease 05/10/2017   Heart attack    While living in [place]   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 EF  20-25% by echo in 09/2017 with cath showing mild CAD. b.  Last echo 12/2017 EF 35-40%, grade 2 DD.   Obesity    Onychomycosis of multiple toenails with type 2 diabetes mellitus and peripheral neuropathy 05/10/2017   OSA on CPAP 05/10/2017   Right sided weakness 06/17/2019   Type II diabetes mellitus 11/20/2015   Unsteady gait 12/20/2018   Past Surgical History:  Procedure Laterality Date   BIOPSY  11/22/2021   Procedure: BIOPSY;  Surgeon: Hilarie Fredrickson, MD;  Location: [facility];  Service: Gastroenterology;;   COLONOSCOPY  05/13/11, 08/2017   9 adenomas   COLONOSCOPY WITH PROPOFOL N/A 11/22/2021   Procedure: COLONOSCOPY WITH PROPOFOL;  Surgeon: Hilarie Fredrickson, MD;  Location: [facility];  Service: Gastroenterology;  Laterality: N/A;   ESOPHAGOGASTRODUODENOSCOPY (EGD) WITH PROPOFOL N/A 11/22/2021   Procedure: ESOPHAGOGASTRODUODENOSCOPY (EGD) WITH PROPOFOL;  Surgeon: Yancey Flemings  N, MD;  Location: [facility];  Service: Gastroenterology;  Laterality: N/A;   FOREARM SURGERY     MUSCLE BIOPSY     POLYPECTOMY     POLYPECTOMY  11/22/2021   Procedure: POLYPECTOMY;  Surgeon: Hilarie Fredrickson, MD;  Location: [facility];  Service: Gastroenterology;;   RIGHT/LEFT HEART CATH AND CORONARY ANGIOGRAPHY N/A 08/25/2017   Procedure: RIGHT/LEFT HEART CATH AND CORONARY ANGIOGRAPHY;  Surgeon: Kathleene Hazel, MD;  Location: [facility];  Service: Cardiovascular;  Laterality: N/A;   RIGHT/LEFT HEART CATH AND CORONARY ANGIOGRAPHY N/A 10/01/2019   Procedure: RIGHT/LEFT HEART CATH AND CORONARY ANGIOGRAPHY;  Surgeon: Dolores Patty, MD;  Location: [facility];  Service: Cardiovascular;  Laterality: N/A;   RIGHT/LEFT HEART CATH AND CORONARY ANGIOGRAPHY N/A 04/07/2021   Procedure: RIGHT/LEFT HEART CATH AND CORONARY ANGIOGRAPHY;  Surgeon: Dolores Patty, MD;  Location: [facility];  Service: Cardiovascular;  Laterality: N/A;   FAMILY HISTORY Family History  Problem Relation Age of  Onset   Heart disease Mother    Heart attack Mother 79   Hypertension Mother    Hyperlipidemia Mother    Diabetes Mother    Heart disease Father    Heart attack Father 58   Hypertension Father    Hyperlipidemia Father    Diabetes Father    Heart attack Sister 55   Colon cancer Neg Hx    Stomach cancer Neg Hx    Esophageal cancer Neg Hx    Rectal cancer Neg Hx    Colon polyps Neg Hx    Lung disease Neg Hx    SOCIAL HISTORY Social History   Tobacco Use   Smoking status: Never   Smokeless tobacco: Never  Vaping Use   Vaping status: Never Used  Substance Use Topics   Alcohol use: No   Drug use: No       OPHTHALMIC EXAM:  Base Eye Exam     Visual Acuity (Snellen - Linear)       Right Left   Dist [place] CF at 3' 20/60   Dist ph [place] 20/300 20/40         Tonometry (Tonopen, 8:40 AM)       Right Left   Pressure 20 15         Pupils       Dark Light Shape React APD   Right 3 2 Round Slow None   Left 3 2 Round Slow None         Visual Fields       Left Right    Full Full         Extraocular Movement       Right Left    Full, Ortho Full, Ortho         Neuro/Psych     Oriented x3: Yes   Mood/Affect: Normal         Dilation     Both eyes: 1.0% Mydriacyl, 2.5% Phenylephrine @ 8:37 AM           Slit Lamp and Fundus Exam     Slit Lamp Exam       Right Left   Lids/Lashes Dermatochalasis - upper lid, Meibomian gland dysfunction Dermatochalasis - upper lid, Meibomian gland dysfunction   Conjunctiva/Sclera nasal and temporal pinguecula, Melanosis nasal and temporal pinguecula, Melanosis   Cornea arcus arcus, 2+ inferior Punctate epithelial erosions   Anterior Chamber deep, clear, narrow temporal angle Deep and quiet   Iris  Round and dilated, No NVI Round and dilated, No NVI   Lens 2-3+ Nuclear sclerosis, 2-3+ Cortical cataract 2-3+ Nuclear sclerosis, 2-3+ Cortical cataract   Anterior Vitreous Vitreous syneresis, mild Asteroid hyalosis  Vitreous syneresis         Fundus Exam       Right Left   Disc Pink and Sharp, no NVD Pink and Sharp, no NVD, Compact   C/D Ratio 0.2 0.2   Macula Blunted foveal reflex, central edema - persistent, interval increase in central exudates, scattered MA/DBH -- improved Blunted foveal reflex, central edema with scattered exudates, scattered MA/DBH   Vessels attenuated, Tortuous, ST venule dilated attenuated, Tortuous   Periphery Attached, scattered MA/DBH, exudates greatest posteriorly Attached, scattered MA/DBH and exudates greatest posteriorly           IMAGING AND PROCEDURES  Imaging and Procedures for 08/02/2023  OCT, Retina - OU - Both Eyes       Right Eye Quality was good. Central Foveal Thickness: 269. Progression has been stable. Findings include no SRF, abnormal foveal contour, subretinal hyper-reflective material, intraretinal hyper-reflective material, intraretinal fluid, vitreomacular adhesion (persistent central edema with IRF/IRHM/SRHM).   Left Eye Quality was good. Central Foveal Thickness: 211. Progression has been stable. Findings include no SRF, abnormal foveal contour, intraretinal hyper-reflective material, intraretinal fluid, subretinal fluid (persistent IRF/SRHM).   Notes *Images captured and stored on drive  Diagnosis / Impression:  Central DME OU OD: persistent central edema with IRF/IRHM/SRHM OS: persistent IRF/SRHM  Clinical management:  See below  Abbreviations: NFP - Normal foveal profile. CME - cystoid macular edema. PED - pigment epithelial detachment. IRF - intraretinal fluid. SRF - subretinal fluid. EZ - ellipsoid zone. ERM - epiretinal membrane. ORA - outer retinal atrophy. ORT - outer retinal tubulation. SRHM - subretinal hyper-reflective material. IRHM - intraretinal hyper-reflective material      Intravitreal Injection, Pharmacologic Agent - OD - Right Eye       Time Out 08/02/2023. 9:15 AM. Confirmed correct patient, procedure, site,  and patient consented.   Anesthesia Topical anesthesia was used. Anesthetic medications included Lidocaine 2%, Proparacaine 0.5%.   Procedure Preparation included 5% betadine to ocular surface, eyelid speculum. A supplied (32g) needle was used.   Injection: 1.25 mg Bevacizumab 1.25mg /0.50ml   Route: Intravitreal, Site: Right Eye   NDC: P3213405, Lot: [id number], Expiration date: 09/01/2023   Post-op Post injection exam found visual acuity of at least counting fingers. The patient tolerated the procedure well. There were no complications. The patient received written and verbal post procedure care education. Post injection medications were not given.      Intravitreal Injection, Pharmacologic Agent - OS - Left Eye       Time Out 08/02/2023. 9:16 AM. Confirmed correct patient, procedure, site, and patient consented.   Anesthesia Topical anesthesia was used. Anesthetic medications included Lidocaine 2%, Proparacaine 0.5%.   Procedure Preparation included 5% betadine to ocular surface, eyelid speculum. A supplied (32g) needle was used.   Injection: 1.25 mg Bevacizumab 1.25mg /0.34ml   Route: Intravitreal, Site: Left Eye   NDC: P3213405, Lot: [id number], Expiration date: 09/16/2023   Post-op Post injection exam found visual acuity of at least counting fingers. The patient tolerated the procedure well. There were no complications. The patient received written and verbal post procedure care education. Post injection medications were not given.            ASSESSMENT/PLAN:    ICD-10-CM   1. Severe nonproliferative diabetic retinopathy of both eyes with macular edema  associated with type 2 diabetes mellitus (HCC)  E11.3413 OCT, Retina - OU - Both Eyes    Intravitreal Injection, Pharmacologic Agent - OD - Right Eye    Intravitreal Injection, Pharmacologic Agent - OS - Left Eye    Bevacizumab (AVASTIN) SOLN 1.25 mg    Bevacizumab (AVASTIN) SOLN 1.25 mg    2. Essential  hypertension  I10     3. Hypertensive retinopathy of both eyes  H35.033     4. Combined forms of age-related cataract of both eyes  H25.813       1. Severe Non-proliferative diabetic retinopathy w/ DME, OU  - pt delayed from 4 weeks to 12 weeks (11.06.24 to 01.29.25) due to being in the hospital multiple times  - pt delayed from 4 weeks to 5+ months (04.24.24 to 10.09.24) due to other medical procedures  - pt delayed from 4 weeks to 11 months (04.18.22 to 03.27.23) due to other health issues  - pt was delayed from 4 weeks to 9 weeks (12.14.21 to 2.21.22) due to financial / insurance reasons  - A1c 12.0 on 09.18.24  - s/p IVA OS #1 (09.16.21), IVA OD #1 (09.21.21)             - s/p IVA OU #2 (10.19.21), #3 (11.16.21), #4 (12.14.21), #5 (02.21.22), #6 (03.21.22), #7 (04.18.22), #8 (03.27.23), #9 (04.24.23), #10 (10.03.24), #11 (11.06.24) - exam shows central edema OU, scattered IRH and severe exudates OU - FA (09.16.21) shows late leaking MA greatest posterior pole; no NV; +peripheral vascular perfusion defects -- may benefit from peripheral PRP OU  - OCT shows OD: persistent central edema with IRF/IRHM/SRHM; OS: persistent IRF/SRHM at 12 weeks  - BCVA OD 20/300 from 20/70; OS 20/40 from 20/50 - recommend IVA OU #12 today, 01.29.25 w/ f/u back to 4 wks - pt wishes to proceed with injection - RBA of procedure discussed, questions answered - informed consent obtained and signed - see procedure note - Avastin informed consent obtained, re-signed and scanned, 01.29.25 (OU) - f/u 4 weeks, DFE, OCT, possible injection(s)  2,3. Hypertensive retinopathy OU - discussed importance of tight BP control - monitor  4. Mixed Cataract OU - The symptoms of cataract, surgical options, and treatments and risks were discussed with patient. - discussed diagnosis and progression - now following with Dr. Zenaida Niece   Ophthalmic Meds Ordered this visit:  Meds ordered this encounter  Medications   Bevacizumab  (AVASTIN) SOLN 1.25 mg   Bevacizumab (AVASTIN) SOLN 1.25 mg     Return in about 4 weeks (around 08/30/2023) for f/u NPDR OU, DFE, OCT, Possible Injxn.  There are no Patient Instructions on file for this visit.  This document serves as a record of services personally performed by Karie Chimera, MD, PhD. It was created on their behalf by De Blanch, an ophthalmic technician. The creation of this record is the provider's dictation and/or activities during the visit.    Electronically signed by: De Blanch, OA, 08/02/23  12:19 PM  This document serves as a record of services personally performed by Karie Chimera, MD, PhD. It was created on their behalf by Glee Arvin. Manson Passey, OA an ophthalmic technician. The creation of this record is the provider's dictation and/or activities during the visit.    Electronically signed by: Glee Arvin. Manson Passey, OA 08/02/23 12:19 PM  Karie Chimera, M.D., Ph.D. Diseases & Surgery of the Retina and Vitreous [facility]  I have reviewed the above documentation for accuracy  and completeness, and I agree with the above. Karie Chimera, M.D., Ph.D. 08/02/23 12:21 PM   Abbreviations: M myopia (nearsighted); A astigmatism; H hyperopia (farsighted); P presbyopia; Mrx spectacle prescription;  CTL contact lenses; OD right eye; OS left eye; OU both eyes  XT exotropia; ET esotropia; PEK punctate epithelial keratitis; PEE punctate epithelial erosions; DES dry eye syndrome; MGD meibomian gland dysfunction; ATs artificial tears; PFAT's preservative free artificial tears; NSC nuclear sclerotic cataract; PSC posterior subcapsular cataract; ERM epi-retinal membrane; PVD posterior vitreous detachment; RD retinal detachment; DM diabetes mellitus; DR diabetic retinopathy; NPDR non-proliferative diabetic retinopathy; PDR proliferative diabetic retinopathy; CSME clinically significant macular edema; DME diabetic macular edema; dbh dot blot hemorrhages; CWS  cotton wool spot; POAG primary open angle glaucoma; C/D cup-to-disc ratio; HVF humphrey visual field; GVF goldmann visual field; OCT optical coherence tomography; IOP intraocular pressure; BRVO Branch retinal vein occlusion; CRVO central retinal vein occlusion; CRAO central retinal artery occlusion; BRAO branch retinal artery occlusion; RT retinal tear; SB scleral buckle; PPV pars plana vitrectomy; VH Vitreous hemorrhage; PRP panretinal laser photocoagulation; IVK intravitreal kenalog; VMT vitreomacular traction; MH Macular hole;  NVD neovascularization of the disc; NVE neovascularization elsewhere; AREDS age related eye disease study; ARMD age related macular degeneration; POAG primary open angle glaucoma; EBMD epithelial/anterior basement membrane dystrophy; ACIOL anterior chamber intraocular lens; IOL intraocular lens; PCIOL posterior chamber intraocular lens; Phaco/IOL phacoemulsification with intraocular lens placement; PRK photorefractive keratectomy; LASIK laser assisted in situ keratomileusis; HTN hypertension; DM diabetes mellitus; COPD chronic obstructive pulmonary disease

## 2023-07-31 ENCOUNTER — Ambulatory Visit: Payer: Medicare HMO | Admitting: Podiatry

## 2023-08-02 ENCOUNTER — Encounter (INDEPENDENT_AMBULATORY_CARE_PROVIDER_SITE_OTHER): Payer: Self-pay | Admitting: Ophthalmology

## 2023-08-02 ENCOUNTER — Ambulatory Visit (INDEPENDENT_AMBULATORY_CARE_PROVIDER_SITE_OTHER): Payer: Medicare Other | Admitting: Ophthalmology

## 2023-08-02 DIAGNOSIS — E113413 Type 2 diabetes mellitus with severe nonproliferative diabetic retinopathy with macular edema, bilateral: Secondary | ICD-10-CM | POA: Diagnosis not present

## 2023-08-02 DIAGNOSIS — H25813 Combined forms of age-related cataract, bilateral: Secondary | ICD-10-CM

## 2023-08-02 DIAGNOSIS — H35033 Hypertensive retinopathy, bilateral: Secondary | ICD-10-CM | POA: Diagnosis not present

## 2023-08-02 DIAGNOSIS — I10 Essential (primary) hypertension: Secondary | ICD-10-CM

## 2023-08-02 MED ORDER — BEVACIZUMAB CHEMO INJECTION 1.25MG/0.05ML SYRINGE FOR KALEIDOSCOPE
1.2500 mg | INTRAVITREAL | Status: AC | PRN
  Administered 2023-08-02: 1.25 mg via INTRAVITREAL

## 2023-08-18 ENCOUNTER — Telehealth (HOSPITAL_COMMUNITY): Payer: Self-pay

## 2023-08-18 NOTE — Telephone Encounter (Signed)
Called and left pt a voice message to confirm/remind patient of their appointment [facility] on [facility].   And to bring in all medications and/or complete list.

## 2023-08-18 NOTE — Progress Notes (Signed)
[facility]   PCP: [facility] Primary Cardiologist: Dr. Clifton James  Neurology: Dr. Karel Jarvis  HF Cardiologist: Dr. Gala Romney   HPI Calvin [name] is a 63 y.o. male with history of chronic systolic HF due to NICM, uncontrolled DM2, PAF, GERD, HTN, MI 2015, memory issues, and hyperlipidemia.   Admitted 2/19 with CP. Underwent LHC/RHC with mild nonobstructive CAD & EF 35-40%.  Readmitted in 10/2017 with chest pain. CTA was negative for PE.   Sleep study 07/2018 normal.   Echo 7/20 EF 35-40%, mild LVH, normal RV size and function. No significant valvular dysfunction.   Seen in HF clinic 3/21 for first time/ R/L cath in 3/21 with non-obstructive CAD (LAD 64m, 60d, LCX 40% diffuse, RCA 30p, 69m). RHC well compensated PCWP 8 CI 4.3  Echo 02/08/21 EF 30-35% RV mild HK   Echo 8/23 EF 35-40%  CPX 8/22 with severe HF limitation.  BP rest: 110/64 BP peak: 114/60  Peak VO2: 13.3 (52% predicted peak VO2)  VE/VCO2 slope:  57  OUES: 0.95  Peak RER: 1.07  Ventilatory Threshold: 10.3 (40% predicted or measured peak VO2)  Peak RR 80  Peak Ventilation:  65.3  VE/MVV:  60%  PETCO2 at peak:  26  O2pulse:  10  (75% predicted O2pulse)   cMRI 03/17/21 with LVEF 45%, no LGE  Repeat R/LHC in  04/07/21 for severe CP and dyspnea  mild nonobstructive CAD, LVEF recovered to 55-60% by visual estimate, normal hemodynamics.  Echo 8/23 showed EF 35-40%, moderate LVH, grade I DD, RV normal.  Echo 03/22/23, EF 30-35%, RV low/normal  Today he returns for HF follow up. Overall feeling "wore out." He has SOB walking short distances on flat ground. Walks with a cane for balance. He has some LE swelling. He has dizziness and CP "off and on." Denies palpitations, abnormal bleeding, or PND/Orthopnea. Appetite ok. No fever or chills. He is not weighing at home. No tobacco/ETOH use. Has been out of all cardiac meds x 3-4 days.  Cardiac Studies - Echo 9/24: EF 30-35%, RV low/normal  -  Echo (8/23) showed EF 35-40%, moderate LVH, grade I DD, RV normal  - cMRI (9/22): EF 45%, no LGE, RV okay, RVEF 47%, breathhold artifact noted  - R/LHC (10/22): Ao = 144/69  LV = 144/9 RA = 3 RV = 30/6 PA = 26/9 (16) PCW = 10 Fick cardiac output/index = 5.8/3.1 SVR = 1370 PVR =  1.0 WU Ao sat = 95% PA sat = 73%, 74% SVC sat = 79%   Mid LAD lesion is 40% stenosed.   Prox Cx to Dist Cx lesion is 40% stenosed.   Dist RCA lesion is 30% stenosed.   Prox RCA to Mid RCA lesion is 40% stenosed.   The left ventricular ejection fraction is 55-65% by visual estimate.  - Echo (8/22): EF 30-35%, RV mildly reduced, trivial Calvin  - Echo (3/21): EF 40-45% RV ok.   - L/RHC (3/21): with non-obstructive CAD (LAD 73m, 60d, LCX 40% diffuse, RCA 30p, 82m). RHC well compensated PCWP 8 CI 4.3  - Echo (6/19): EF 35-40% Grade II DD. Moderate global reduction in LV systolic function; moderate DD.  - Echo (3/19): EF 20-25% Grade IDD   - L/RHC (2/19)  RA 5  PA 22/11 (15)  PCWP 9  CO/CI 6/3.14  Prox RCA to Mid RCA lesion is 30% stenosed. Mid RCA to Dist RCA lesion is 30% stenosed. Prox Cx to Dist Cx lesion is  20% stenosed. Mid LAD lesion is 30% stenosed. There is mild to moderate left ventricular systolic dysfunction. LV end diastolic pressure is mildly elevated. The left ventricular ejection fraction is 35-45% by visual estimate. There is no mitral valve regurgitation.   SH: Disabled for 6 years due to diabetes. He does not smoke. Denies cocaine abuse.   Past Medical History:  Diagnosis Date   Adenoidal hypertrophy    Adrenal nodule 09/21/2020   AF (paroxysmal atrial fibrillation) 03/10/2018   AKI (acute kidney injury) 06/17/2019   Arthritis    Cataract    Mixed OU   Chronic anticoagulation 10/15/2019   Chronic systolic heart failure 11/22/2015   Coronary artery disease    a. cath in 08/2017 showing mild nonobstructive CAD with scattered 20-30% stenosis.    Diabetic neuropathy  09/26/2017   Diabetic retinopathy    NPDR OU   Essential hypertension 11/22/2015   Foot pain, bilateral 02/01/2019   Gastroesophageal reflux disease 05/10/2017   Heart attack    While living in [place]   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 EF 20-25% by echo in 09/2017 with cath showing mild CAD. b.  Last echo 12/2017 EF 35-40%, grade 2 DD.   Obesity    Onychomycosis of multiple toenails with type 2 diabetes mellitus and peripheral neuropathy 05/10/2017   OSA on CPAP 05/10/2017   Right sided weakness 06/17/2019   Type II diabetes mellitus 11/20/2015   Unsteady gait 12/20/2018   Current Outpatient Medications  Medication Sig Dispense Refill   alum & mag hydroxide-simeth (MAALOX MAX) 400-400-40 MG/5ML suspension Take 15 mLs by mouth every 6 (six) hours as needed for indigestion. (Patient not taking: Reported on 08/21/2023) 355 mL 0   amitriptyline (ELAVIL) 25 MG tablet Take 25 mg by mouth at bedtime. (Patient not taking: Reported on 08/21/2023)     amLODipine (NORVASC) 10 MG tablet Take 10 mg by mouth daily. (Patient not taking: Reported on 08/21/2023)     bisacodyl (DULCOLAX) 5 MG EC tablet Take 2 tablets (10 mg total) by mouth every other day. Take at bedtime (Patient not taking: Reported on 08/21/2023) 30 tablet 0   Blood Glucose Monitoring Suppl (ONETOUCH VERIO FLEX SYSTEM) w/Device KIT 1 each by Other route daily. (Patient not taking: Reported on 08/21/2023)     Blood Pressure KIT Use as directed to check blood pressure twice daily. (Patient not taking: Reported on 08/21/2023) 1 kit 0   carvedilol (COREG) 6.25 MG tablet Take 6.25 mg by mouth 2  (two) times daily with a meal. (Patient not taking: Reported on 08/21/2023)     dapagliflozin propanediol (FARXIGA) 10 MG TABS tablet Take 1 tablet (10 mg total) by mouth every morning. (Patient not taking: Reported on 08/21/2023) 90 tablet 3   docusate sodium (COLACE) 100 MG capsule Take 100 mg by mouth 2 (two) times daily. (Patient not taking: Reported on 08/21/2023)     donepezil (ARICEPT) 10 MG tablet TAKE ONE TABLET BY MOUTH EVERYDAY AT BEDTIME (Patient not taking: Reported on 08/21/2023) 30 tablet 11   ELIQUIS 5 MG TABS tablet TAKE ONE TABLET BY MOUTH EVERY MORNING and TAKE ONE TABLET BY MOUTH EVERYDAY AT BEDTIME (  Patient not taking: Reported on 08/21/2023) 60 tablet 5   ezetimibe (ZETIA) 10 MG tablet TAKE ONE TABLET BY MOUTH EVERY MORNING (Patient not taking: Reported on 08/21/2023) 30 tablet 11   glucose blood (ONETOUCH VERIO) test strip 1 each by Other route 2 (two) times daily. And lancets 2/day (Patient not taking: Reported on 08/21/2023) 200 each 3   insulin degludec (TRESIBA FLEXTOUCH) 200 UNIT/ML FlexTouch Pen Inject 54 Units into the skin daily. And pen needles 1/day (Patient not taking: Reported on 08/21/2023) 18 mL 3   insulin glargine (LANTUS) 100 UNIT/ML injection Inject 36 Units into the skin at bedtime. (Patient not taking: Reported on 08/21/2023)     insulin lispro (HUMALOG) 100 UNIT/ML injection Inject 16 Units into the skin 3 (three) times daily before meals. (Patient not taking: Reported on 08/21/2023)     Insulin Syringe-Needle U-100 (INSULIN SYRINGE .3CC/29GX1/2") 29G X 1/2" 0.3 ML MISC Inject 1 Syringe 3 (three) times daily as directed. Check blood sugar three times daily. Dx: E11.9 (Patient not taking: Reported on 08/21/2023) 100 each 3   lactulose (CEPHULAC) 10 g packet Take 30 mLs by mouth as needed. (Patient not taking: Reported on 08/21/2023)     Lancets (ONETOUCH DELICA PLUS LANCET30G) MISC USE TWICE DAILY (Patient not taking: Reported on 08/21/2023) 200 each 3   losartan (COZAAR)  25 MG tablet Take 1 tablet (25 mg total) by mouth daily. (Patient not taking: Reported on 08/21/2023) 90 tablet 3   metFORMIN (GLUCOPHAGE) 1000 MG tablet Take 1,000 mg by mouth daily. (Patient not taking: Reported on 08/21/2023)     metoCLOPramide (REGLAN) 10 MG tablet Take 10 mg by mouth 2 (two) times daily. (Patient not taking: Reported on 08/21/2023)     nitroGLYCERIN (NITROSTAT) 0.4 MG SL tablet Place 1 tablet (0.4 mg total) under the tongue every 5 (five) minutes as needed for chest pain. (Patient not taking: Reported on 08/21/2023) 30 tablet 1   ondansetron (ZOFRAN-ODT) 4 MG disintegrating tablet Take 1 tablet (4 mg total) by mouth every 8 (eight) hours as needed for nausea or vomiting. (Patient not taking: Reported on 08/21/2023) 8 tablet 0   pantoprazole (PROTONIX) 40 MG tablet TAKE ONE TABLET BY MOUTH EVERY MORNING and TAKE ONE TABLET BY MOUTH EVERYDAY AT BEDTIME (Patient not taking: Reported on 08/21/2023) 60 tablet 5   polyethylene glycol (MIRALAX / GLYCOLAX) 17 g packet Take 17 g by mouth 2 (two) times daily. (Patient not taking: Reported on 08/21/2023) 14 each 0   REPATHA 140 MG/ML SOSY INJECT 1 PEN INTO THE SKIN EVERY 14 DAYS (Patient not taking: Reported on 08/21/2023) 6 mL 3   rosuvastatin (CRESTOR) 40 MG tablet Take 40 mg by mouth at bedtime. (Patient not taking: Reported on 08/21/2023)     sildenafil (VIAGRA) 100 MG tablet Take 1 tablet (100 mg total) by mouth daily as needed for erectile dysfunction. (Patient not taking: Reported on 08/21/2023) 10 tablet 11   spironolactone (ALDACTONE) 25 MG tablet TAKE ONE TABLET BY MOUTH EVERY MORNING (Patient not taking: Reported on 08/21/2023) 90 tablet 3   triamcinolone cream (KENALOG) 0.1 % Apply 1 Application topically daily. (Patient not taking: Reported on 08/21/2023)     No current facility-administered medications for this encounter.   No Known Allergies  Social History   Socioeconomic History   Marital status: Married    Spouse name: Not on  file   Number of children: 0   Years of education: 13   Highest education level: Some college, no  degree  Occupational History   Occupation: Disability  Tobacco Use   Smoking status: Never   Smokeless tobacco: Never  Vaping Use   Vaping status: Never Used  Substance and Sexual Activity   Alcohol use: No   Drug use: No   Sexual activity: Yes    Birth control/protection: None  Other Topics Concern   Not on file  Social History Narrative   Social History   Diet?    Do you drink/eat things with caffeine? yes   Marital status?       single      Do you live in a house, apartment, assisted living, condo, trailer, etc.? yes   Is it one or more stories? One story   How many persons live in your home?   Do you have any pets in your home? (please list)    Highest level of education completed? graduate   Do you exercise?            no                          Type & how often?   Advanced Directives   Do you have a living will? no   Do you have a DNR form?                                  If not, do you want to discuss one? no   Do you have signed POA/HPOA for forms? no      Functional Status   Do you have difficulty bathing or dressing yourself? no   Do you have difficulty preparing food or eating? no   Do you have difficulty managing your medications? no   Do you have difficulty managing your finances? no   Do you have difficulty affording your medications? no   Social Drivers of Corporate investment banker Strain: Not on file  Food Insecurity: No Food Insecurity (08/25/2021)   Hunger Vital Sign    Worried About Running Out of Food in the Last Year: Never true    Ran Out of Food in the Last Year: Never true  Transportation Needs: Unknown (02/15/2021)   PRAPARE - Administrator, Civil Service (Medical): No    Lack of Transportation (Non-Medical): Not on file  Physical Activity: Not on file  Stress: Not on file  Social Connections: Not on file  Intimate Partner  Violence: Not on file   Family History  Problem Relation Age of Onset   Heart disease Mother    Heart attack Mother 74   Hypertension Mother    Hyperlipidemia Mother    Diabetes Mother    Heart disease Father    Heart attack Father 70   Hypertension Father    Hyperlipidemia Father    Diabetes Father    Heart attack Sister 38   Colon cancer Neg Hx    Stomach cancer Neg Hx    Esophageal cancer Neg Hx    Rectal cancer Neg Hx    Colon polyps Neg Hx    Lung disease Neg Hx    BP 138/76   Pulse 85   Wt 74.5 kg (164 lb 3.2 oz)   SpO2 100%   BMI 25.72 kg/m   Wt Readings from Last 3 Encounters:  08/21/23 74.5 kg (164 lb 3.2 oz)  03/22/23 75.8 kg (167 lb)  03/10/23 75 kg (165 lb 5.5 oz)   PHYSICAL EXAM: General:  NAD. No resp difficulty HEENT: Normal Neck: Supple. No JVD. Cor: Regular rate & rhythm. No rubs, gallops or murmurs. Lungs: Clear Abdomen: Soft, nontender, nondistended.  Extremities: No cyanosis, clubbing, rash, edema Neuro: Alert & oriented x 3, moves all 4 extremities w/o difficulty. Affect pleasant.  ECG (personally reviewed): NSR 87 bpm, QRS 90 msec  ASSESSMENT & PLAN: 1. Chronic Systolic /Diastolic Heart Failure with recovered LV function, NICM - Echo (6/19): EF 35-40%. LHC 2/219 with nonobstructive CAD.  - Echo (7/20): EF 35-40% - Echo (3/21): EF 40-45% RV ok  - R/LHC (3/21): with non-obstructive CAD (LAD 52m, 60d, LCX 40% diffuse, RCA 30p, 2m). RHC well compensated PCWP 8 CI 4.3 - Echo (8/22): EF 30-35% RV mild HK  - CPX (8/22): severe HF limitation with blunted BP response, elevated NW/GN56 slope - cMRI (9/22) LVEF 45%, no LGE, RVEF 47% - R/LHC (10/22): EF 55-60% visually, mild CAD, normal hemodynamics - Echo (8/23): EF 35-40%, RV ok. - Echo 03/22/23, EF 30-35% (not consistently taking GDMT). - NYHA II, volume looks good today. Weight stable. - Continue carvedilol 6.25 mg bid. Refill - Continue Farxiga 10 mg daily. Refill - Continue spironolactone  25 mg daily. Refill - Continue losartan 25 mg daily (failed Entresto x 2 with low BP) Refill - Labs today. - Repeat echo soon, if EF remains < 35%, will refer to EP for ICD consideration. Narrow QRS  2. HTN - Well controlled. - GDMT as above.  3. Type 2 diabetes mellitus - hgb A1C 12.0 (03/22/23) - Continue Farxiga. No GU symptoms. - No change  4. CAD - mild nonobstructive CAD by cath 10/22 - No s/s angina - No ASA w/ Eliquis.  - Continue ? blocker. - On statin, Repatha and zetia -> lipids managed by PCP  5. PAF - NSR on ECG today. - Continue Eliquis. No bleeding issues  Refill cardiac meds, arrange for auto-ship and auto-fill to help with med compliance.  Update echo soon. Follow up in 3 months with Dr. Gala Romney  Jacklynn Ganong, FNP  12:15 PM

## 2023-08-21 ENCOUNTER — Ambulatory Visit (HOSPITAL_COMMUNITY)
Admission: RE | Admit: 2023-08-21 | Discharge: 2023-08-21 | Disposition: A | Discharge status: Home / Self Care | Payer: Medicare Other | Source: Ambulatory Visit | Attending: Family Medicine

## 2023-08-21 ENCOUNTER — Encounter (HOSPITAL_COMMUNITY): Payer: Self-pay

## 2023-08-21 VITALS — BP 138/76 | HR 85 | Wt 164.2 lb

## 2023-08-21 DIAGNOSIS — I10 Essential (primary) hypertension: Secondary | ICD-10-CM

## 2023-08-21 DIAGNOSIS — E1169 Type 2 diabetes mellitus with other specified complication: Secondary | ICD-10-CM | POA: Insufficient documentation

## 2023-08-21 DIAGNOSIS — E785 Hyperlipidemia, unspecified: Secondary | ICD-10-CM | POA: Insufficient documentation

## 2023-08-21 DIAGNOSIS — K219 Gastro-esophageal reflux disease without esophagitis: Secondary | ICD-10-CM | POA: Diagnosis not present

## 2023-08-21 DIAGNOSIS — I5042 Chronic combined systolic (congestive) and diastolic (congestive) heart failure: Secondary | ICD-10-CM | POA: Diagnosis not present

## 2023-08-21 DIAGNOSIS — I110 Hypertensive heart disease with heart failure: Secondary | ICD-10-CM | POA: Insufficient documentation

## 2023-08-21 DIAGNOSIS — I252 Old myocardial infarction: Secondary | ICD-10-CM | POA: Diagnosis not present

## 2023-08-21 DIAGNOSIS — E1159 Type 2 diabetes mellitus with other circulatory complications: Secondary | ICD-10-CM | POA: Diagnosis not present

## 2023-08-21 DIAGNOSIS — I5022 Chronic systolic (congestive) heart failure: Secondary | ICD-10-CM

## 2023-08-21 DIAGNOSIS — I2510 Atherosclerotic heart disease of native coronary artery without angina pectoris: Secondary | ICD-10-CM | POA: Diagnosis not present

## 2023-08-21 DIAGNOSIS — Z7901 Long term (current) use of anticoagulants: Secondary | ICD-10-CM | POA: Diagnosis not present

## 2023-08-21 DIAGNOSIS — I480 Paroxysmal atrial fibrillation: Secondary | ICD-10-CM

## 2023-08-21 DIAGNOSIS — Z79899 Other long term (current) drug therapy: Secondary | ICD-10-CM | POA: Insufficient documentation

## 2023-08-21 DIAGNOSIS — I959 Hypotension, unspecified: Secondary | ICD-10-CM | POA: Diagnosis not present

## 2023-08-21 DIAGNOSIS — E1165 Type 2 diabetes mellitus with hyperglycemia: Secondary | ICD-10-CM

## 2023-08-21 DIAGNOSIS — I428 Other cardiomyopathies: Secondary | ICD-10-CM | POA: Diagnosis not present

## 2023-08-21 LAB — BASIC METABOLIC PANEL
Anion gap: 8 (ref 5–15)
BUN: 15 mg/dL (ref 8–23)
CO2: 25 mmol/L (ref 22–32)
Calcium: 9 mg/dL (ref 8.9–10.3)
Chloride: 102 mmol/L (ref 98–111)
Creatinine, Ser: 1.29 mg/dL — ABNORMAL HIGH (ref 0.61–1.24)
GFR, Estimated: >60 mL/min (ref >60)
Glucose, Bld: 310 mg/dL — ABNORMAL HIGH (ref 70–99)
Potassium: 4.2 mmol/L (ref 3.5–5.1)
Sodium: 135 mmol/L (ref 135–145)

## 2023-08-21 LAB — BRAIN NATRIURETIC PEPTIDE: B Natriuretic Peptide: 165 pg/mL — ABNORMAL HIGH (ref 0.0–100.0)

## 2023-08-21 MED ORDER — CARVEDILOL 6.25 MG PO TABS: 6.2500 mg | ORAL_TABLET | Freq: Two times a day (BID) | ORAL | 3 refills | Status: DC

## 2023-08-21 MED ORDER — DAPAGLIFLOZIN PROPANEDIOL 10 MG PO TABS: 10.0000 mg | ORAL_TABLET | Freq: Every morning | ORAL | 3 refills | Status: DC

## 2023-08-21 MED ORDER — EZETIMIBE 10 MG PO TABS: 10.0000 mg | ORAL_TABLET | Freq: Every morning | ORAL | 3 refills | Status: DC

## 2023-08-21 MED ORDER — APIXABAN 5 MG PO TABS: 5.0000 mg | ORAL_TABLET | Freq: Two times a day (BID) | ORAL | 5 refills | Status: DC

## 2023-08-21 MED ORDER — ROSUVASTATIN CALCIUM 40 MG PO TABS: 40.0000 mg | ORAL_TABLET | Freq: Every day | ORAL | 3 refills | Status: DC

## 2023-08-21 MED ORDER — SPIRONOLACTONE 25 MG PO TABS: 25.0000 mg | ORAL_TABLET | Freq: Every morning | ORAL | 3 refills | Status: DC

## 2023-08-21 MED ORDER — LOSARTAN POTASSIUM 25 MG PO TABS: 25.0000 mg | ORAL_TABLET | Freq: Every day | ORAL | 3 refills | Status: DC

## 2023-08-21 NOTE — Patient Instructions (Addendum)
Thank you for coming in today  If you had labs drawn today, any labs that are abnormal the clinic will call you No news is good news  Medications: Restart all of the following Coreg 6.25 mg 1 tablet twice daily Farxiga 10 mg 1 tablet daily Eliquis 5 mg 1 tablet twice daily Losartan 25 mg 1 tablet daily Spironolactone 25 mg 1 tablet daily Crestor 40 mg 1 tablet daily Zetia 10 mg 1 tablet daily   Follow up appointments: Your physician recommends that you return for lab work in: 2 weeks Bmet    Your physician recommends that you schedule a follow-up appointment in:  2-3 months With Dr. Gala Romney  You will receive a reminder letter in the mail a few months in advance. If you don't receive a letter, please call our office to schedule the follow-up appointment.  Your physician has requested that you have an echocardiogram. Echocardiography is a painless test that uses sound waves to create images of your heart. It provides your doctor with information about the size and shape of your heart and how well your heart's chambers and valves are working. This procedure takes approximately one hour. There are no restrictions for this procedure.      Do the following things EVERYDAY: Weigh yourself in the morning before breakfast. Write it down and keep it in a log. Take your medicines as prescribed Eat low salt foods--Limit salt (sodium) to 2000 mg per day.  Stay as active as you can everyday Limit all fluids for the day to less than 2 liters   At the [facility], you and your health needs are our priority. As part of our continuing mission to provide you with exceptional heart care, we have created designated Provider Care Teams. These Care Teams include your primary Cardiologist (physician) and Advanced Practice Providers (APPs- Physician Assistants and Nurse Practitioners) who all work together to provide you with the care you need, when you need it.   You may see any of  the following providers on your designated Care Team at your next follow up: Dr Arvilla Meres Dr Marca Ancona Dr. Marcos Eke, NP Robbie Lis, [place] [facility] [place], [place] Brynda Peon, NP Karle Plumber, PharmD   Please be sure to bring in all your medications bottles to every appointment.    Thank you for choosing [facility]  If you have any questions or concerns before your next appointment please send [place] a message through [place] or call our office at [phone].    TO LEAVE A MESSAGE FOR THE NURSE SELECT OPTION 2, PLEASE LEAVE A MESSAGE INCLUDING: YOUR NAME DATE OF BIRTH CALL BACK NUMBER REASON FOR CALL**this is important as we prioritize the call backs  YOU WILL RECEIVE A CALL BACK THE SAME DAY AS LONG AS YOU CALL BEFORE 4:00 PM

## 2023-08-28 NOTE — Progress Notes (Shared)
[facility] - Clinic Note  08/30/2023     CHIEF COMPLAINT Patient presents for No chief complaint on file.   HISTORY OF PRESENT ILLNESS: Calvin [name] is a 63 y.o. male who presents to the clinic today for:   Pt is delayed to follow up from 4 weeks to 12 weeks due to being in the hospital, he states his vision is the same   Referring physician: Sharyn Dross MD [address] [place], [place] [address]  HISTORICAL INFORMATION:   Selected notes from the MEDICAL RECORD NUMBER Referred by Dr. Sharyn Dross LEE: 09/27/21 BCVA OD 20/80 OS 20/60-2 Ocular history: NPDR, Medical history: DM   CURRENT MEDICATIONS: No current outpatient medications on file. (Ophthalmic Drugs)   No current facility-administered medications for this visit. (Ophthalmic Drugs)   Current Outpatient Medications (Other)  Medication Sig   alum & mag hydroxide-simeth (MAALOX MAX) 400-400-40 MG/5ML suspension Take 15 mLs by mouth every 6 (six) hours as needed for indigestion. (Patient not taking: Reported on 08/21/2023)   amitriptyline (ELAVIL) 25 MG tablet Take 25 mg by mouth at bedtime. (Patient not taking: Reported on 08/21/2023)   amLODipine (NORVASC) 10 MG tablet Take 10 mg by mouth daily. (Patient not taking: Reported on 08/21/2023)   apixaban (ELIQUIS) 5 MG TABS tablet Take 1 tablet (5 mg total) by mouth 2 (two) times daily.   bisacodyl (DULCOLAX) 5 MG EC tablet Take 2 tablets (10 mg total) by mouth every other day. Take at bedtime (Patient not taking: Reported on 08/21/2023)   Blood Glucose Monitoring Suppl (ONETOUCH VERIO FLEX SYSTEM) w/Device KIT 1 each by Other route daily. (Patient not taking: Reported on 08/21/2023)   Blood Pressure KIT Use as directed to check blood pressure twice daily. (Patient not taking: Reported on 08/21/2023)   carvedilol (COREG) 6.25 MG tablet Take 1 tablet (6.25 mg total) by mouth 2 (two) times daily with a meal.   dapagliflozin propanediol  (FARXIGA) 10 MG TABS tablet Take 1 tablet (10 mg total) by mouth every morning.   docusate sodium (COLACE) 100 MG capsule Take 100 mg by mouth 2 (two) times daily. (Patient not taking: Reported on 08/21/2023)   donepezil (ARICEPT) 10 MG tablet TAKE ONE TABLET BY MOUTH EVERYDAY AT BEDTIME (Patient not taking: Reported on 08/21/2023)   ezetimibe (ZETIA) 10 MG tablet Take 1 tablet (10 mg total) by mouth every morning.   glucose blood (ONETOUCH VERIO) test strip 1 each by Other route 2 (two) times daily. And lancets 2/day (Patient not taking: Reported on 08/21/2023)   insulin degludec (TRESIBA FLEXTOUCH) 200 UNIT/ML FlexTouch Pen Inject 54 Units into the skin daily. And pen needles 1/day (Patient not taking: Reported on 08/21/2023)   insulin glargine (LANTUS) 100 UNIT/ML injection Inject 36 Units into the skin at bedtime. (Patient not taking: Reported on 08/21/2023)   insulin lispro (HUMALOG) 100 UNIT/ML injection Inject 16 Units into the skin 3 (three) times daily before meals. (Patient not taking: Reported on 08/21/2023)   Insulin Syringe-Needle U-100 (INSULIN SYRINGE .3CC/29GX1/2") 29G X 1/2" 0.3 ML MISC Inject 1 Syringe 3 (three) times daily as directed. Check blood sugar three times daily. Dx: E11.9 (Patient not taking: Reported on 08/21/2023)   lactulose (CEPHULAC) 10 g packet Take 30 mLs by mouth as needed. (Patient not taking: Reported on 08/21/2023)   Lancets (ONETOUCH DELICA PLUS LANCET30G) MISC USE TWICE DAILY (Patient not taking: Reported on 08/21/2023)   losartan (COZAAR) 25 MG tablet Take 1 tablet (25 mg  total) by mouth daily.   metFORMIN (GLUCOPHAGE) 1000 MG tablet Take 1,000 mg by mouth daily. (Patient not taking: Reported on 08/21/2023)   metoCLOPramide (REGLAN) 10 MG tablet Take 10 mg by mouth 2 (two) times daily. (Patient not taking: Reported on 08/21/2023)   nitroGLYCERIN (NITROSTAT) 0.4 MG SL tablet Place 1 tablet (0.4 mg total) under the tongue every 5 (five) minutes as needed for chest pain.  (Patient not taking: Reported on 08/21/2023)   ondansetron (ZOFRAN-ODT) 4 MG disintegrating tablet Take 1 tablet (4 mg total) by mouth every 8 (eight) hours as needed for nausea or vomiting. (Patient not taking: Reported on 08/21/2023)   pantoprazole (PROTONIX) 40 MG tablet TAKE ONE TABLET BY MOUTH EVERY MORNING and TAKE ONE TABLET BY MOUTH EVERYDAY AT BEDTIME (Patient not taking: Reported on 08/21/2023)   polyethylene glycol (MIRALAX / GLYCOLAX) 17 g packet Take 17 g by mouth 2 (two) times daily. (Patient not taking: Reported on 08/21/2023)   REPATHA 140 MG/ML SOSY INJECT 1 PEN INTO THE SKIN EVERY 14 DAYS (Patient not taking: Reported on 08/21/2023)   rosuvastatin (CRESTOR) 40 MG tablet Take 1 tablet (40 mg total) by mouth at bedtime.   sildenafil (VIAGRA) 100 MG tablet Take 1 tablet (100 mg total) by mouth daily as needed for erectile dysfunction. (Patient not taking: Reported on 08/21/2023)   spironolactone (ALDACTONE) 25 MG tablet Take 1 tablet (25 mg total) by mouth every morning.   triamcinolone cream (KENALOG) 0.1 % Apply 1 Application topically daily. (Patient not taking: Reported on 08/21/2023)   No current facility-administered medications for this visit. (Other)   REVIEW OF SYSTEMS:   ALLERGIES No Known Allergies  PAST MEDICAL HISTORY Past Medical History:  Diagnosis Date   Adenoidal hypertrophy    Adrenal nodule 09/21/2020   AF (paroxysmal atrial fibrillation) 03/10/2018   AKI (acute kidney injury) 06/17/2019   Arthritis    Cataract    Mixed OU   Chronic anticoagulation 10/15/2019   Chronic systolic heart failure 11/22/2015   Coronary artery disease    a. cath in 08/2017 showing mild nonobstructive CAD with scattered 20-30% stenosis.    Diabetic neuropathy 09/26/2017   Diabetic retinopathy    NPDR OU   Essential hypertension 11/22/2015   Foot pain, bilateral 02/01/2019   Gastroesophageal reflux disease 05/10/2017   Heart attack    While living in [place]   History of  adenomatous colonic polyps 05/19/2011   05/2011 9 adenomas 08/2017 5 adenomas (dimin) Recall 2022   History of COVID-19 06/18/2019   HLD (hyperlipidemia) 11/20/2015   Hypoglycemia due to insulin 10/15/2019   Insomnia 05/10/2017   Intermittent claudication 05/25/2020   Mild neurocognitive disorder due to Alzheimer's disease, possible 08/24/2021   Nonischemic cardiomyopathy    a. EF 20-25% by echo in 09/2017 with cath showing mild CAD. b.  Last echo 12/2017 EF 35-40%, grade 2 DD.   Obesity    Onychomycosis of multiple toenails with type 2 diabetes mellitus and peripheral neuropathy 05/10/2017   OSA on CPAP 05/10/2017   Right sided weakness 06/17/2019   Type II diabetes mellitus 11/20/2015   Unsteady gait 12/20/2018   Past Surgical History:  Procedure Laterality Date   BIOPSY  11/22/2021   Procedure: BIOPSY;  Surgeon: Hilarie Fredrickson, MD;  Location: [facility];  Service: Gastroenterology;;   COLONOSCOPY  05/13/11, 08/2017   9 adenomas   COLONOSCOPY WITH PROPOFOL N/A 11/22/2021   Procedure: COLONOSCOPY WITH PROPOFOL;  Surgeon: Hilarie Fredrickson, MD;  Location: [facility];  Service: Gastroenterology;  Laterality: N/A;   ESOPHAGOGASTRODUODENOSCOPY (EGD) WITH PROPOFOL N/A 11/22/2021   Procedure: ESOPHAGOGASTRODUODENOSCOPY (EGD) WITH PROPOFOL;  Surgeon: Hilarie Fredrickson, MD;  Location: [facility];  Service: Gastroenterology;  Laterality: N/A;   FOREARM SURGERY     MUSCLE BIOPSY     POLYPECTOMY     POLYPECTOMY  11/22/2021   Procedure: POLYPECTOMY;  Surgeon: Hilarie Fredrickson, MD;  Location: [facility];  Service: Gastroenterology;;   RIGHT/LEFT HEART CATH AND CORONARY ANGIOGRAPHY N/A 08/25/2017   Procedure: RIGHT/LEFT HEART CATH AND CORONARY ANGIOGRAPHY;  Surgeon: Kathleene Hazel, MD;  Location: [facility];  Service: Cardiovascular;  Laterality: N/A;   RIGHT/LEFT HEART CATH AND CORONARY ANGIOGRAPHY N/A 10/01/2019   Procedure: RIGHT/LEFT HEART CATH AND CORONARY ANGIOGRAPHY;  Surgeon:  Dolores Patty, MD;  Location: [facility];  Service: Cardiovascular;  Laterality: N/A;   RIGHT/LEFT HEART CATH AND CORONARY ANGIOGRAPHY N/A 04/07/2021   Procedure: RIGHT/LEFT HEART CATH AND CORONARY ANGIOGRAPHY;  Surgeon: Dolores Patty, MD;  Location: [facility];  Service: Cardiovascular;  Laterality: N/A;   FAMILY HISTORY Family History  Problem Relation Age of Onset   Heart disease Mother    Heart attack Mother 81   Hypertension Mother    Hyperlipidemia Mother    Diabetes Mother    Heart disease Father    Heart attack Father 52   Hypertension Father    Hyperlipidemia Father    Diabetes Father    Heart attack Sister 16   Colon cancer Neg Hx    Stomach cancer Neg Hx    Esophageal cancer Neg Hx    Rectal cancer Neg Hx    Colon polyps Neg Hx    Lung disease Neg Hx    SOCIAL HISTORY Social History   Tobacco Use   Smoking status: Never   Smokeless tobacco: Never  Vaping Use   Vaping status: Never Used  Substance Use Topics   Alcohol use: No   Drug use: No       OPHTHALMIC EXAM:  Not recorded    IMAGING AND PROCEDURES  Imaging and Procedures for 08/30/2023          ASSESSMENT/PLAN:    ICD-10-CM   1. Severe nonproliferative diabetic retinopathy of both eyes with macular edema associated with type 2 diabetes mellitus (HCC)  Z61.0960     2. Essential hypertension  I10     3. Hypertensive retinopathy of both eyes  H35.033     4. Combined forms of age-related cataract of both eyes  H25.813       1. Severe Non-proliferative diabetic retinopathy w/ DME, OU  - pt delayed from 4 weeks to 12 weeks (11.06.24 to 01.29.25) due to being in the hospital multiple times  - pt delayed from 4 weeks to 5+ months (04.24.24 to 10.09.24) due to other medical procedures  - pt delayed from 4 weeks to 11 months (04.18.22 to 03.27.23) due to other health issues  - pt was delayed from 4 weeks to 9 weeks (12.14.21 to 2.21.22) due to financial / insurance  reasons  - A1c 12.0 on 09.18.24  - s/p IVA OS #1 (09.16.21), IVA OD #1 (09.21.21)             - s/p IVA OU #2 (10.19.21), #3 (11.16.21), #4 (12.14.21), #5 (02.21.22), #6 (03.21.22), #7 (04.18.22), #8 (03.27.23), #9 (04.24.23), #10 (10.03.24), #11 (11.06.24), #12 (01.29.25) - exam shows central edema OU, scattered IRH and severe exudates OU - FA (  09.16.21) shows late leaking MA greatest posterior pole; no NV; +peripheral vascular perfusion defects -- may benefit from peripheral PRP OU  - OCT shows OD: persistent central edema with IRF/IRHM/SRHM; OS: persistent IRF/SRHM at 12 weeks  - BCVA OD 20/300 from 20/70; OS 20/40 from 20/50 - recommend IVA OU #13 today, 02.26.25 w/ f/u back to 4 wks - pt wishes to proceed with injection - RBA of procedure discussed, questions answered - informed consent obtained and signed - see procedure note - Avastin informed consent obtained, re-signed and scanned, 01.29.25 (OU) - f/u 4 weeks, DFE, OCT, possible injection(s)  2,3. Hypertensive retinopathy OU - discussed importance of tight BP control - monitor  4. Mixed Cataract OU - The symptoms of cataract, surgical options, and treatments and risks were discussed with patient. - discussed diagnosis and progression - now following with Dr. Zenaida Niece   Ophthalmic Meds Ordered this visit:  No orders of the defined types were placed in this encounter.    No follow-ups on file.  There are no Patient Instructions on file for this visit.  This document serves as a record of services personally performed by Karie Chimera, MD, PhD. It was created on their behalf by Glee Arvin. Manson Passey, OA an ophthalmic technician. The creation of this record is the provider's dictation and/or activities during the visit.    Electronically signed by: Glee Arvin. Manson Passey, OA 08/28/23 12:07 PM   Karie Chimera, M.D., Ph.D. Diseases & Surgery of the Retina and Vitreous [facility]    Abbreviations: M myopia  (nearsighted); A astigmatism; H hyperopia (farsighted); P presbyopia; Mrx spectacle prescription;  CTL contact lenses; OD right eye; OS left eye; OU both eyes  XT exotropia; ET esotropia; PEK punctate epithelial keratitis; PEE punctate epithelial erosions; DES dry eye syndrome; MGD meibomian gland dysfunction; ATs artificial tears; PFAT's preservative free artificial tears; NSC nuclear sclerotic cataract; PSC posterior subcapsular cataract; ERM epi-retinal membrane; PVD posterior vitreous detachment; RD retinal detachment; DM diabetes mellitus; DR diabetic retinopathy; NPDR non-proliferative diabetic retinopathy; PDR proliferative diabetic retinopathy; CSME clinically significant macular edema; DME diabetic macular edema; dbh dot blot hemorrhages; CWS cotton wool spot; POAG primary open angle glaucoma; C/D cup-to-disc ratio; HVF humphrey visual field; GVF goldmann visual field; OCT optical coherence tomography; IOP intraocular pressure; BRVO Branch retinal vein occlusion; CRVO central retinal vein occlusion; CRAO central retinal artery occlusion; BRAO branch retinal artery occlusion; RT retinal tear; SB scleral buckle; PPV pars plana vitrectomy; VH Vitreous hemorrhage; PRP panretinal laser photocoagulation; IVK intravitreal kenalog; VMT vitreomacular traction; MH Macular hole;  NVD neovascularization of the disc; NVE neovascularization elsewhere; AREDS age related eye disease study; ARMD age related macular degeneration; POAG primary open angle glaucoma; EBMD epithelial/anterior basement membrane dystrophy; ACIOL anterior chamber intraocular lens; IOL intraocular lens; PCIOL posterior chamber intraocular lens; Phaco/IOL phacoemulsification with intraocular lens placement; PRK photorefractive keratectomy; LASIK laser assisted in situ keratomileusis; HTN hypertension; DM diabetes mellitus; COPD chronic obstructive pulmonary disease

## 2023-08-30 ENCOUNTER — Encounter (INDEPENDENT_AMBULATORY_CARE_PROVIDER_SITE_OTHER): Payer: Medicare Other | Admitting: Ophthalmology

## 2023-08-30 DIAGNOSIS — H25813 Combined forms of age-related cataract, bilateral: Secondary | ICD-10-CM

## 2023-08-30 DIAGNOSIS — E113413 Type 2 diabetes mellitus with severe nonproliferative diabetic retinopathy with macular edema, bilateral: Secondary | ICD-10-CM

## 2023-08-30 DIAGNOSIS — H35033 Hypertensive retinopathy, bilateral: Secondary | ICD-10-CM

## 2023-08-30 DIAGNOSIS — I10 Essential (primary) hypertension: Secondary | ICD-10-CM

## 2023-08-31 ENCOUNTER — Ambulatory Visit (HOSPITAL_COMMUNITY)
Admission: RE | Admit: 2023-08-31 | Discharge: 2023-08-31 | Disposition: A | Discharge status: Home / Self Care | Payer: Medicare Other | Source: Ambulatory Visit | Attending: Cardiology | Admitting: Cardiology

## 2023-08-31 DIAGNOSIS — I5022 Chronic systolic (congestive) heart failure: Secondary | ICD-10-CM | POA: Insufficient documentation

## 2023-08-31 LAB — BASIC METABOLIC PANEL
Anion gap: 11 (ref 5–15)
BUN: 7 mg/dL — ABNORMAL LOW (ref 8–23)
CO2: 26 mmol/L (ref 22–32)
Calcium: 9.1 mg/dL (ref 8.9–10.3)
Chloride: 98 mmol/L (ref 98–111)
Creatinine, Ser: 1.3 mg/dL — ABNORMAL HIGH (ref 0.61–1.24)
GFR, Estimated: >60 mL/min (ref >60)
Glucose, Bld: 384 mg/dL — ABNORMAL HIGH (ref 70–99)
Potassium: 3.8 mmol/L (ref 3.5–5.1)
Sodium: 135 mmol/L (ref 135–145)

## 2023-09-06 ENCOUNTER — Encounter: Payer: Self-pay | Admitting: Podiatry

## 2023-09-06 ENCOUNTER — Ambulatory Visit (INDEPENDENT_AMBULATORY_CARE_PROVIDER_SITE_OTHER): Payer: Medicare Other | Admitting: Podiatry

## 2023-09-06 DIAGNOSIS — M79675 Pain in left toe(s): Secondary | ICD-10-CM

## 2023-09-06 DIAGNOSIS — B351 Tinea unguium: Secondary | ICD-10-CM

## 2023-09-06 DIAGNOSIS — M79674 Pain in right toe(s): Secondary | ICD-10-CM

## 2023-09-06 DIAGNOSIS — E1149 Type 2 diabetes mellitus with other diabetic neurological complication: Secondary | ICD-10-CM | POA: Diagnosis not present

## 2023-09-06 NOTE — Progress Notes (Signed)
 [facility] - Clinic Note  09/18/2023     CHIEF COMPLAINT Patient presents for Retina Follow Up   HISTORY OF PRESENT ILLNESS: Calvin [name] is a 63 y.o. male who presents to the clinic today for:  HPI     Retina Follow Up   Patient presents with  Diabetic Retinopathy.  In both eyes.  This started 6 weeks ago.  Duration of 6 weeks.  Since onset it is gradually worsening.  I, the attending physician,  performed the HPI with the patient and updated documentation appropriately.        Comments   6 week retina follow up and IVA OU pt is reporting vision is blurred his blood sugar is running high with the last reading 245 this am pt denies any flashes or floaters       Last edited by Rennis Chris, MD on 09/18/2023 10:16 AM.    Delayed f/u - 6.5 wks instead of 4 Pt states vision is about the same   Referring physician: Sharyn Dross MD [address], [address] [place], [place] [address]  HISTORICAL INFORMATION:   Selected notes from the MEDICAL RECORD NUMBER Referred by Dr. Sharyn Dross LEE: 09/27/21 BCVA OD 20/80 OS 20/60-2 Ocular history: NPDR, Medical history: DM   CURRENT MEDICATIONS: No current outpatient medications on file. (Ophthalmic Drugs)   No current facility-administered medications for this visit. (Ophthalmic Drugs)   Current Outpatient Medications (Other)  Medication Sig   alum & mag hydroxide-simeth (MAALOX MAX) 400-400-40 MG/5ML suspension Take 15 mLs by mouth every 6 (six) hours as needed for indigestion. (Patient not taking: Reported on 08/21/2023)   amitriptyline (ELAVIL) 25 MG tablet Take 25 mg by mouth at bedtime. (Patient not taking: Reported on 08/21/2023)   amLODipine (NORVASC) 10 MG tablet Take 10 mg by mouth daily. (Patient not taking: Reported on 08/21/2023)   apixaban (ELIQUIS) 5 MG TABS tablet Take 1 tablet (5 mg total) by mouth 2 (two) times daily.   bisacodyl (DULCOLAX) 5 MG EC tablet Take 2 tablets (10 mg total) by  mouth every other day. Take at bedtime (Patient not taking: Reported on 08/21/2023)   Blood Glucose Monitoring Suppl (ONETOUCH VERIO FLEX SYSTEM) w/Device KIT 1 each by Other route daily. (Patient not taking: Reported on 08/21/2023)   Blood Pressure KIT Use as directed to check blood pressure twice daily. (Patient not taking: Reported on 08/21/2023)   carvedilol (COREG) 6.25 MG tablet Take 1 tablet (6.25 mg total) by mouth 2 (two) times daily with a meal.   dapagliflozin propanediol (FARXIGA) 10 MG TABS tablet Take 1 tablet (10 mg total) by mouth every morning.   docusate sodium (COLACE) 100 MG capsule Take 100 mg by mouth 2 (two) times daily. (Patient not taking: Reported on 08/21/2023)   donepezil (ARICEPT) 10 MG tablet TAKE ONE TABLET BY MOUTH EVERYDAY AT BEDTIME (Patient not taking: Reported on 08/21/2023)   ezetimibe (ZETIA) 10 MG tablet Take 1 tablet (10 mg total) by mouth every morning.   glucose blood (ONETOUCH VERIO) test strip 1 each by Other route 2 (two) times daily. And lancets 2/day (Patient not taking: Reported on 08/21/2023)   insulin degludec (TRESIBA FLEXTOUCH) 200 UNIT/ML FlexTouch Pen Inject 54 Units into the skin daily. And pen needles 1/day (Patient not taking: Reported on 08/21/2023)   insulin glargine (LANTUS) 100 UNIT/ML injection Inject 36 Units into the skin at bedtime. (Patient not taking: Reported on 08/21/2023)   insulin lispro (HUMALOG) 100 UNIT/ML injection Inject  16 Units into the skin 3 (three) times daily before meals. (Patient not taking: Reported on 08/21/2023)   Insulin Syringe-Needle U-100 (INSULIN SYRINGE .3CC/29GX1/2") 29G X 1/2" 0.3 ML MISC Inject 1 Syringe 3 (three) times daily as directed. Check blood sugar three times daily. Dx: E11.9 (Patient not taking: Reported on 08/21/2023)   lactulose (CEPHULAC) 10 g packet Take 30 mLs by mouth as needed. (Patient not taking: Reported on 08/21/2023)   Lancets (ONETOUCH DELICA PLUS LANCET30G) MISC USE TWICE DAILY (Patient not  taking: Reported on 08/21/2023)   losartan (COZAAR) 25 MG tablet Take 1 tablet (25 mg total) by mouth daily.   metFORMIN (GLUCOPHAGE) 1000 MG tablet Take 1,000 mg by mouth daily. (Patient not taking: Reported on 08/21/2023)   metoCLOPramide (REGLAN) 10 MG tablet Take 10 mg by mouth 2 (two) times daily. (Patient not taking: Reported on 08/21/2023)   nitroGLYCERIN (NITROSTAT) 0.4 MG SL tablet Place 1 tablet (0.4 mg total) under the tongue every 5 (five) minutes as needed for chest pain. (Patient not taking: Reported on 08/21/2023)   ondansetron (ZOFRAN-ODT) 4 MG disintegrating tablet Take 1 tablet (4 mg total) by mouth every 8 (eight) hours as needed for nausea or vomiting. (Patient not taking: Reported on 08/21/2023)   pantoprazole (PROTONIX) 40 MG tablet TAKE ONE TABLET BY MOUTH EVERY MORNING and TAKE ONE TABLET BY MOUTH EVERYDAY AT BEDTIME (Patient not taking: Reported on 08/21/2023)   polyethylene glycol (MIRALAX / GLYCOLAX) 17 g packet Take 17 g by mouth 2 (two) times daily. (Patient not taking: Reported on 08/21/2023)   REPATHA 140 MG/ML SOSY INJECT 1 PEN INTO THE SKIN EVERY 14 DAYS (Patient not taking: Reported on 08/21/2023)   rosuvastatin (CRESTOR) 40 MG tablet Take 1 tablet (40 mg total) by mouth at bedtime.   sildenafil (VIAGRA) 100 MG tablet Take 1 tablet (100 mg total) by mouth daily as needed for erectile dysfunction. (Patient not taking: Reported on 08/21/2023)   spironolactone (ALDACTONE) 25 MG tablet Take 1 tablet (25 mg total) by mouth every morning.   triamcinolone cream (KENALOG) 0.1 % Apply 1 Application topically daily. (Patient not taking: Reported on 08/21/2023)   No current facility-administered medications for this visit. (Other)   REVIEW OF SYSTEMS: ROS   Positive for: Gastrointestinal, Neurological, Musculoskeletal, Endocrine, Cardiovascular, Eyes, Respiratory Negative for: Constitutional, Skin, Genitourinary, HENT, Psychiatric, Allergic/Imm, Heme/Lymph Last edited by Etheleen Mayhew, COT on 09/18/2023  8:30 AM.      ALLERGIES No Known Allergies  PAST MEDICAL HISTORY Past Medical History:  Diagnosis Date   Adenoidal hypertrophy    Adrenal nodule 09/21/2020   AF (paroxysmal atrial fibrillation) 03/10/2018   AKI (acute kidney injury) 06/17/2019   Arthritis    Cataract    Mixed OU   Chronic anticoagulation 10/15/2019   Chronic systolic heart failure 11/22/2015   Coronary artery disease    a. cath in 08/2017 showing mild nonobstructive CAD with scattered 20-30% stenosis.    Diabetic neuropathy 09/26/2017   Diabetic retinopathy    NPDR OU   Essential hypertension 11/22/2015   Foot pain, bilateral 02/01/2019   Gastroesophageal reflux disease 05/10/2017   Heart attack    While living in [place]   History of adenomatous colonic polyps 05/19/2011   05/2011 9 adenomas 08/2017 5 adenomas (dimin) Recall 2022   History of COVID-19 06/18/2019   HLD (hyperlipidemia) 11/20/2015   Hypoglycemia due to insulin 10/15/2019   Insomnia 05/10/2017   Intermittent claudication 05/25/2020   Mild neurocognitive disorder due to Alzheimer's  disease, possible 08/24/2021   Nonischemic cardiomyopathy    a. EF 20-25% by echo in 09/2017 with cath showing mild CAD. b.  Last echo 12/2017 EF 35-40%, grade 2 DD.   Obesity    Onychomycosis of multiple toenails with type 2 diabetes mellitus and peripheral neuropathy 05/10/2017   OSA on CPAP 05/10/2017   Right sided weakness 06/17/2019   Type II diabetes mellitus 11/20/2015   Unsteady gait 12/20/2018   Past Surgical History:  Procedure Laterality Date   BIOPSY  11/22/2021   Procedure: BIOPSY;  Surgeon: Hilarie Fredrickson, MD;  Location: [facility];  Service: Gastroenterology;;   COLONOSCOPY  05/13/11, 08/2017   9 adenomas   COLONOSCOPY WITH PROPOFOL N/A 11/22/2021   Procedure: COLONOSCOPY WITH PROPOFOL;  Surgeon: Hilarie Fredrickson, MD;  Location: [facility];  Service: Gastroenterology;  Laterality: N/A;   ESOPHAGOGASTRODUODENOSCOPY (EGD)  WITH PROPOFOL N/A 11/22/2021   Procedure: ESOPHAGOGASTRODUODENOSCOPY (EGD) WITH PROPOFOL;  Surgeon: Hilarie Fredrickson, MD;  Location: [facility];  Service: Gastroenterology;  Laterality: N/A;   FOREARM SURGERY     MUSCLE BIOPSY     POLYPECTOMY     POLYPECTOMY  11/22/2021   Procedure: POLYPECTOMY;  Surgeon: Hilarie Fredrickson, MD;  Location: [facility];  Service: Gastroenterology;;   RIGHT/LEFT HEART CATH AND CORONARY ANGIOGRAPHY N/A 08/25/2017   Procedure: RIGHT/LEFT HEART CATH AND CORONARY ANGIOGRAPHY;  Surgeon: Kathleene Hazel, MD;  Location: [facility];  Service: Cardiovascular;  Laterality: N/A;   RIGHT/LEFT HEART CATH AND CORONARY ANGIOGRAPHY N/A 10/01/2019   Procedure: RIGHT/LEFT HEART CATH AND CORONARY ANGIOGRAPHY;  Surgeon: Dolores Patty, MD;  Location: [facility];  Service: Cardiovascular;  Laterality: N/A;   RIGHT/LEFT HEART CATH AND CORONARY ANGIOGRAPHY N/A 04/07/2021   Procedure: RIGHT/LEFT HEART CATH AND CORONARY ANGIOGRAPHY;  Surgeon: Dolores Patty, MD;  Location: [facility];  Service: Cardiovascular;  Laterality: N/A;   FAMILY HISTORY Family History  Problem Relation Age of Onset   Heart disease Mother    Heart attack Mother 77   Hypertension Mother    Hyperlipidemia Mother    Diabetes Mother    Heart disease Father    Heart attack Father 63   Hypertension Father    Hyperlipidemia Father    Diabetes Father    Heart attack Sister 52   Colon cancer Neg Hx    Stomach cancer Neg Hx    Esophageal cancer Neg Hx    Rectal cancer Neg Hx    Colon polyps Neg Hx    Lung disease Neg Hx    SOCIAL HISTORY Social History   Tobacco Use   Smoking status: Never   Smokeless tobacco: Never  Vaping Use   Vaping status: Never Used  Substance Use Topics   Alcohol use: No   Drug use: No       OPHTHALMIC EXAM:  Base Eye Exam     Visual Acuity (Snellen - Linear)       Right Left   Dist [place] CF at 3' 20/50   Dist ph [place] 20/400 20/40 -2          Tonometry (Tonopen, 8:34 AM)       Right Left   Pressure 16 15         Pupils       Pupils Dark Light Shape React APD   Right PERRL 3 2 Round Brisk None   Left PERRL 3 2 Round Brisk None  Visual Fields       Left Right    Full Full         Extraocular Movement       Right Left    Full, Ortho Full, Ortho         Neuro/Psych     Oriented x3: Yes   Mood/Affect: Normal         Dilation     Both eyes: 2.5% Phenylephrine @ 8:35 AM           Slit Lamp and Fundus Exam     Slit Lamp Exam       Right Left   Lids/Lashes Dermatochalasis - upper lid, Meibomian gland dysfunction Dermatochalasis - upper lid, Meibomian gland dysfunction   Conjunctiva/Sclera nasal and temporal pinguecula, Melanosis nasal and temporal pinguecula, Melanosis   Cornea arcus arcus, 2+ inferior Punctate epithelial erosions   Anterior Chamber deep, clear, narrow temporal angle Deep and quiet   Iris Round and dilated, No NVI Round and dilated, No NVI   Lens 2-3+ Nuclear sclerosis, 2-3+ Cortical cataract 2-3+ Nuclear sclerosis, 2-3+ Cortical cataract   Anterior Vitreous Vitreous syneresis, mild Asteroid hyalosis Vitreous syneresis         Fundus Exam       Right Left   Disc Pink and Sharp, no NVD Pink and Sharp, no NVD, Compact   C/D Ratio 0.2 0.2   Macula Blunted foveal reflex, central edema - persistent, diffuse exudates greatest centrally, scattered MA/DBH -- improved Blunted foveal reflex, central edema with scattered exudates, scattered MA/DBH   Vessels attenuated, Tortuous, ST venule dilated attenuated, Tortuous   Periphery Attached, scattered MA/DBH, exudates greatest posteriorly Attached, scattered MA/DBH and exudates greatest posteriorly           IMAGING AND PROCEDURES  Imaging and Procedures for 09/18/2023  OCT, Retina - OU - Both Eyes       Right Eye Quality was good. Central Foveal Thickness: 251. Progression has improved. Findings include no  SRF, abnormal foveal contour, subretinal hyper-reflective material, intraretinal hyper-reflective material, intraretinal fluid, vitreomacular adhesion (Mild interval improvement in central edema with IRF/IRHM/SRHM).   Left Eye Quality was good. Central Foveal Thickness: 222. Progression has improved. Findings include no SRF, abnormal foveal contour, intraretinal hyper-reflective material, intraretinal fluid, subretinal fluid (Mild interval improvement in IRF/SRHM).   Notes *Images captured and stored on drive  Diagnosis / Impression:  Central DME OU OD: Mild interval improvement in central edema with IRF/IRHM/SRHM OS: Mild interval improvement in IRF/SRHM  Clinical management:  See below  Abbreviations: NFP - Normal foveal profile. CME - cystoid macular edema. PED - pigment epithelial detachment. IRF - intraretinal fluid. SRF - subretinal fluid. EZ - ellipsoid zone. ERM - epiretinal membrane. ORA - outer retinal atrophy. ORT - outer retinal tubulation. SRHM - subretinal hyper-reflective material. IRHM - intraretinal hyper-reflective material      Intravitreal Injection, Pharmacologic Agent - OD - Right Eye       Time Out 09/18/2023. 9:15 AM. Confirmed correct patient, procedure, site, and patient consented.   Anesthesia Topical anesthesia was used. Anesthetic medications included Lidocaine 2%, Proparacaine 0.5%.   Procedure Preparation included 5% betadine to ocular surface, eyelid speculum. A supplied (32g) needle was used.   Injection: 1.25 mg Bevacizumab 1.25mg /0.37ml   Route: Intravitreal, Site: Right Eye   NDC: P3213405, Lot: [id number] , Expiration date: 10/09/2023   Post-op Post injection exam found visual acuity of at least counting fingers. The patient tolerated the procedure well. There were no complications.  The patient received written and verbal post procedure care education. Post injection medications were not given.      Intravitreal Injection, Pharmacologic  Agent - OS - Left Eye       Time Out 09/18/2023. 9:18 AM. Confirmed correct patient, procedure, site, and patient consented.   Anesthesia Topical anesthesia was used. Anesthetic medications included Lidocaine 2%, Proparacaine 0.5%.   Procedure Preparation included 5% betadine to ocular surface, eyelid speculum. A supplied (32g) needle was used.   Injection: 1.25 mg Bevacizumab 1.25mg /0.61ml   Route: Intravitreal, Site: Left Eye   NDC: P3213405, Lot: [id number], Expiration date: 11/05/2023   Post-op Post injection exam found visual acuity of at least counting fingers. The patient tolerated the procedure well. There were no complications. The patient received written and verbal post procedure care education. Post injection medications were not given.            ASSESSMENT/PLAN:    ICD-10-CM   1. Severe nonproliferative diabetic retinopathy of both eyes with macular edema associated with type 2 diabetes mellitus (HCC)  E11.3413 OCT, Retina - OU - Both Eyes    Intravitreal Injection, Pharmacologic Agent - OD - Right Eye    Intravitreal Injection, Pharmacologic Agent - OS - Left Eye    Bevacizumab (AVASTIN) SOLN 1.25 mg    Bevacizumab (AVASTIN) SOLN 1.25 mg    2. Essential hypertension  I10     3. Hypertensive retinopathy of both eyes  H35.033     4. Combined forms of age-related cataract of both eyes  H25.813      1. Severe Non-proliferative diabetic retinopathy w/ DME, OU  - pt delayed from 4 weeks to 6.5 weeks (01.29.25 to 03.17.25)  - pt delayed from 4 weeks to 12 weeks (11.06.24 to 01.29.25) due to being in the hospital multiple times  - pt delayed from 4 weeks to 5+ months (04.24.24 to 10.09.24) due to other medical procedures  - pt delayed from 4 weeks to 11 months (04.18.22 to 03.27.23) due to other health issues  - pt was delayed from 4 weeks to 9 weeks (12.14.21 to 2.21.22) due to financial / insurance reasons  - A1c 12.0 on 09.18.24  - s/p IVA OS #1 (09.16.21), IVA  OD #1 (09.21.21)             - s/p IVA OU #2 (10.19.21), #3 (11.16.21), #4 (12.14.21), #5 (02.21.22), #6 (03.21.22), #7 (04.18.22), #8 (03.27.23), #9 (04.24.23), #10 (10.03.24), #11 (11.06.24), #12 (01.29.25) - exam shows central edema OU, scattered IRH and severe exudates OU - FA (09.16.21) shows late leaking MA greatest posterior pole; no NV; +peripheral vascular perfusion defects -- may benefit from peripheral PRP OU  - OCT shows OD: Mild interval improvement in central edema with IRF/IRHM/SRHM; OS: Mild interval improvement in IRF/SRHM at 6.5 weeks  - BCVA OD 20/400 from 20/300; OS stable at 20/40  - recommend IVA OU #13 today, 03.17.25 w/ f/u back to 4 wks - pt wishes to proceed with injection - RBA of procedure discussed, questions answered - informed consent obtained and signed - see procedure note - Avastin informed consent obtained, re-signed and scanned, 01.29.25 (OU) - f/u 4 weeks, DFE, OCT, possible injection(s)  2,3. Hypertensive retinopathy OU - discussed importance of tight BP control - monitor  4. Mixed Cataract OU - The symptoms of cataract, surgical options, and treatments and risks were discussed with patient. - discussed diagnosis and progression - now following with Dr. Zenaida Niece   Ophthalmic Meds Ordered this visit:  Meds ordered this encounter  Medications   Bevacizumab (AVASTIN) SOLN 1.25 mg   Bevacizumab (AVASTIN) SOLN 1.25 mg     Return in about 4 weeks (around 10/16/2023) for f/u NPDR OU, DFE, OCT, Possible Injxn.  There are no Patient Instructions on file for this visit.  This document serves as a record of services personally performed by Karie Chimera, MD, PhD. It was created on their behalf by Glee Arvin. Manson Passey, OA an ophthalmic technician. The creation of this record is the provider's dictation and/or activities during the visit.    Electronically signed by: Glee Arvin. Manson Passey, OA 09/21/23 10:22 PM  Karie Chimera, M.D., Ph.D. Diseases & Surgery of the  Retina and Vitreous [facility]  I have reviewed the above documentation for accuracy and completeness, and I agree with the above. Karie Chimera, M.D., Ph.D. 09/21/23 10:24 PM   Abbreviations: M myopia (nearsighted); A astigmatism; H hyperopia (farsighted); P presbyopia; Mrx spectacle prescription;  CTL contact lenses; OD right eye; OS left eye; OU both eyes  XT exotropia; ET esotropia; PEK punctate epithelial keratitis; PEE punctate epithelial erosions; DES dry eye syndrome; MGD meibomian gland dysfunction; ATs artificial tears; PFAT's preservative free artificial tears; NSC nuclear sclerotic cataract; PSC posterior subcapsular cataract; ERM epi-retinal membrane; PVD posterior vitreous detachment; RD retinal detachment; DM diabetes mellitus; DR diabetic retinopathy; NPDR non-proliferative diabetic retinopathy; PDR proliferative diabetic retinopathy; CSME clinically significant macular edema; DME diabetic macular edema; dbh dot blot hemorrhages; CWS cotton wool spot; POAG primary open angle glaucoma; C/D cup-to-disc ratio; HVF humphrey visual field; GVF goldmann visual field; OCT optical coherence tomography; IOP intraocular pressure; BRVO Branch retinal vein occlusion; CRVO central retinal vein occlusion; CRAO central retinal artery occlusion; BRAO branch retinal artery occlusion; RT retinal tear; SB scleral buckle; PPV pars plana vitrectomy; VH Vitreous hemorrhage; PRP panretinal laser photocoagulation; IVK intravitreal kenalog; VMT vitreomacular traction; MH Macular hole;  NVD neovascularization of the disc; NVE neovascularization elsewhere; AREDS age related eye disease study; ARMD age related macular degeneration; POAG primary open angle glaucoma; EBMD epithelial/anterior basement membrane dystrophy; ACIOL anterior chamber intraocular lens; IOL intraocular lens; PCIOL posterior chamber intraocular lens; Phaco/IOL phacoemulsification with intraocular lens placement; PRK  photorefractive keratectomy; LASIK laser assisted in situ keratomileusis; HTN hypertension; DM diabetes mellitus; COPD chronic obstructive pulmonary disease

## 2023-09-06 NOTE — Progress Notes (Signed)
This patient returns to my office for at risk foot care.  This patient requires this care by a professional since this patient will be at risk due to having CKD, diabetic neuropathy and coagulation defect due to eliquis. This patient is unable to cut nails himself since the patient cannot reach his nails.These nails are painful walking and wearing shoes.  This patient presents for at risk foot care today.  General Appearance  Alert, conversant and in no acute stress.  Vascular  Dorsalis pedis and posterior tibial  pulses are palpable  bilaterally.  Capillary return is within normal limits  bilaterally. Temperature is within normal limits  bilaterally.  Neurologic  Senn-Weinstein monofilament wire test within normal limits  bilaterally. Muscle power within normal limits bilaterally.  Nails Thick disfigured discolored nails with subungual debris  from hallux to fifth toes bilaterally. No evidence of bacterial infection or drainage bilaterally.  Orthopedic  No limitations of motion  feet .  No crepitus or effusions noted.  No bony pathology or digital deformities noted.  Skin  normotropic skin with no porokeratosis noted bilaterally.  No signs of infections or ulcers noted.     Onychomycosis  Pain in right toes  Pain in left toes  Consent was obtained for treatment procedures.   Mechanical debridement of nails 1-5  bilaterally performed with a nail nipper.  Filed with dremel without incident.    Return office visit    4 months                  Told patient to return for periodic foot care and evaluation due to potential at risk complications.   Helane Gunther DPM

## 2023-09-11 ENCOUNTER — Ambulatory Visit (HOSPITAL_COMMUNITY)
Admission: RE | Admit: 2023-09-11 | Discharge: 2023-09-11 | Disposition: A | Discharge status: Home / Self Care | Payer: Medicare Other | Source: Ambulatory Visit | Attending: Internal Medicine | Admitting: Internal Medicine

## 2023-09-11 DIAGNOSIS — I110 Hypertensive heart disease with heart failure: Secondary | ICD-10-CM | POA: Insufficient documentation

## 2023-09-11 DIAGNOSIS — E119 Type 2 diabetes mellitus without complications: Secondary | ICD-10-CM | POA: Insufficient documentation

## 2023-09-11 DIAGNOSIS — I4891 Unspecified atrial fibrillation: Secondary | ICD-10-CM | POA: Insufficient documentation

## 2023-09-11 DIAGNOSIS — I358 Other nonrheumatic aortic valve disorders: Secondary | ICD-10-CM | POA: Diagnosis not present

## 2023-09-11 DIAGNOSIS — I5022 Chronic systolic (congestive) heart failure: Secondary | ICD-10-CM | POA: Insufficient documentation

## 2023-09-11 DIAGNOSIS — Z006 Encounter for examination for normal comparison and control in clinical research program: Secondary | ICD-10-CM

## 2023-09-11 LAB — ECHOCARDIOGRAM COMPLETE
AR max vel: 2.2 cm2
AV Area VTI: 2.18 cm2
AV Area mean vel: 2 cm2
AV Mean grad: 3 mmHg
AV Peak grad: 4.8 mmHg
Ao pk vel: 1.1 m/s
Area-P 1/2: 5.46 cm2
MV VTI: 2.4 cm2
S' Lateral: 4.3 cm
Single Plane A4C EF: 26.4 %

## 2023-09-11 NOTE — Research (Signed)
 SITE: [id number]     Subject # [id number]   Subprotocol: A  Inclusion Criteria  Patients who meet all of the following criteria are eligible for enrollment as study participants:  Yes No  Age > 64 years old X   Eligible to wear Holter Study X    Exclusion Criteria  Patients who meet any of these criteria are not eligible for enrollment as study participants: Yes No  1. Receiving any mechanical (respiratory or circulatory) or renal support therapy at Screening or during Visit #1.  X  2.  Any other conditions that in the opinion of the investigators are likely to prevent compliance with the study protocol or pose a safety concern if the subject participates in the study.  X  3. Poor tolerance, namely susceptible to severe skin allergies from ECG adhesive patch application.  X   Protocol: REV H                                     Residential Zip [address] (First 3 digits ONLY)                                             [facility] Informed Consent   Subject Name: Calvin [name]  Subject met inclusion and exclusion criteria.  The informed consent form, study requirements and expectations were reviewed with the subject. Subject had opportunity to read consent and questions and concerns were addressed prior to the signing of the consent form.  The subject verbalized understanding of the trial requirements.  The subject agreed to participate in the [facility] EF ACT trial and signed the informed consent at 08:49 on 11-Sep-2023.  The informed consent was obtained prior to performance of any protocol-specific procedures for the subject.  A copy of the signed informed consent was given to the subject and a copy was placed in the subject's medical record.   Dyanne Iha          Current Outpatient Medications:    alum & mag hydroxide-simeth (MAALOX MAX) 400-400-40 MG/5ML suspension, Take 15 mLs by mouth every 6 (six) hours as needed for indigestion. (Patient not taking: Reported on 08/21/2023), Disp:  355 mL, Rfl: 0   amitriptyline (ELAVIL) 25 MG tablet, Take 25 mg by mouth at bedtime. (Patient not taking: Reported on 08/21/2023), Disp: , Rfl:    amLODipine (NORVASC) 10 MG tablet, Take 10 mg by mouth daily. (Patient not taking: Reported on 08/21/2023), Disp: , Rfl:    apixaban (ELIQUIS) 5 MG TABS tablet, Take 1 tablet (5 mg total) by mouth 2 (two) times daily., Disp: 60 tablet, Rfl: 5   bisacodyl (DULCOLAX) 5 MG EC tablet, Take 2 tablets (10 mg total) by mouth every other day. Take at bedtime (Patient not taking: Reported on 08/21/2023), Disp: 30 tablet, Rfl: 0   Blood Glucose Monitoring Suppl (ONETOUCH VERIO FLEX SYSTEM) w/Device KIT, 1 each by Other route daily. (Patient not taking: Reported on 08/21/2023), Disp: , Rfl:    Blood Pressure KIT, Use as directed to check blood pressure twice daily. (Patient not taking: Reported on 08/21/2023), Disp: 1 kit, Rfl: 0   carvedilol (COREG) 6.25 MG tablet, Take 1 tablet (6.25 mg total) by mouth 2 (two) times daily with a meal., Disp: 180 tablet, Rfl: 3   dapagliflozin  propanediol (FARXIGA) 10 MG TABS tablet, Take 1 tablet (10 mg total) by mouth every morning., Disp: 90 tablet, Rfl: 3   docusate sodium (COLACE) 100 MG capsule, Take 100 mg by mouth 2 (two) times daily. (Patient not taking: Reported on 08/21/2023), Disp: , Rfl:    donepezil (ARICEPT) 10 MG tablet, TAKE ONE TABLET BY MOUTH EVERYDAY AT BEDTIME (Patient not taking: Reported on 08/21/2023), Disp: 30 tablet, Rfl: 11   ezetimibe (ZETIA) 10 MG tablet, Take 1 tablet (10 mg total) by mouth every morning., Disp: 90 tablet, Rfl: 3   glucose blood (ONETOUCH VERIO) test strip, 1 each by Other route 2 (two) times daily. And lancets 2/day (Patient not taking: Reported on 08/21/2023), Disp: 200 each, Rfl: 3   insulin degludec (TRESIBA FLEXTOUCH) 200 UNIT/ML FlexTouch Pen, Inject 54 Units into the skin daily. And pen needles 1/day (Patient not taking: Reported on 08/21/2023), Disp: 18 mL, Rfl: 3   insulin glargine  (LANTUS) 100 UNIT/ML injection, Inject 36 Units into the skin at bedtime. (Patient not taking: Reported on 08/21/2023), Disp: , Rfl:    insulin lispro (HUMALOG) 100 UNIT/ML injection, Inject 16 Units into the skin 3 (three) times daily before meals. (Patient not taking: Reported on 08/21/2023), Disp: , Rfl:    Insulin Syringe-Needle U-100 (INSULIN SYRINGE .3CC/29GX1/2") 29G X 1/2" 0.3 ML MISC, Inject 1 Syringe 3 (three) times daily as directed. Check blood sugar three times daily. Dx: E11.9 (Patient not taking: Reported on 08/21/2023), Disp: 100 each, Rfl: 3   lactulose (CEPHULAC) 10 g packet, Take 30 mLs by mouth as needed. (Patient not taking: Reported on 08/21/2023), Disp: , Rfl:    Lancets (ONETOUCH DELICA PLUS LANCET30G) MISC, USE TWICE DAILY (Patient not taking: Reported on 08/21/2023), Disp: 200 each, Rfl: 3   losartan (COZAAR) 25 MG tablet, Take 1 tablet (25 mg total) by mouth daily., Disp: 90 tablet, Rfl: 3   metFORMIN (GLUCOPHAGE) 1000 MG tablet, Take 1,000 mg by mouth daily. (Patient not taking: Reported on 08/21/2023), Disp: , Rfl:    metoCLOPramide (REGLAN) 10 MG tablet, Take 10 mg by mouth 2 (two) times daily. (Patient not taking: Reported on 08/21/2023), Disp: , Rfl:    nitroGLYCERIN (NITROSTAT) 0.4 MG SL tablet, Place 1 tablet (0.4 mg total) under the tongue every 5 (five) minutes as needed for chest pain. (Patient not taking: Reported on 08/21/2023), Disp: 30 tablet, Rfl: 1   ondansetron (ZOFRAN-ODT) 4 MG disintegrating tablet, Take 1 tablet (4 mg total) by mouth every 8 (eight) hours as needed for nausea or vomiting. (Patient not taking: Reported on 08/21/2023), Disp: 8 tablet, Rfl: 0   pantoprazole (PROTONIX) 40 MG tablet, TAKE ONE TABLET BY MOUTH EVERY MORNING and TAKE ONE TABLET BY MOUTH EVERYDAY AT BEDTIME (Patient not taking: Reported on 08/21/2023), Disp: 60 tablet, Rfl: 5   polyethylene glycol (MIRALAX / GLYCOLAX) 17 g packet, Take 17 g by mouth 2 (two) times daily. (Patient not taking:  Reported on 08/21/2023), Disp: 14 each, Rfl: 0   REPATHA 140 MG/ML SOSY, INJECT 1 PEN INTO THE SKIN EVERY 14 DAYS (Patient not taking: Reported on 08/21/2023), Disp: 6 mL, Rfl: 3   rosuvastatin (CRESTOR) 40 MG tablet, Take 1 tablet (40 mg total) by mouth at bedtime., Disp: 90 tablet, Rfl: 3   sildenafil (VIAGRA) 100 MG tablet, Take 1 tablet (100 mg total) by mouth daily as needed for erectile dysfunction. (Patient not taking: Reported on 08/21/2023), Disp: 10 tablet, Rfl: 11   spironolactone (ALDACTONE) 25 MG tablet,  Take 1 tablet (25 mg total) by mouth every morning., Disp: 90 tablet, Rfl: 3   triamcinolone cream (KENALOG) 0.1 %, Apply 1 Application topically daily. (Patient not taking: Reported on 08/21/2023), Disp: , Rfl:

## 2023-09-18 ENCOUNTER — Ambulatory Visit (INDEPENDENT_AMBULATORY_CARE_PROVIDER_SITE_OTHER): Payer: Medicare Other | Admitting: Ophthalmology

## 2023-09-18 ENCOUNTER — Encounter (INDEPENDENT_AMBULATORY_CARE_PROVIDER_SITE_OTHER): Payer: Self-pay | Admitting: Ophthalmology

## 2023-09-18 DIAGNOSIS — E113413 Type 2 diabetes mellitus with severe nonproliferative diabetic retinopathy with macular edema, bilateral: Secondary | ICD-10-CM | POA: Diagnosis not present

## 2023-09-18 DIAGNOSIS — H35033 Hypertensive retinopathy, bilateral: Secondary | ICD-10-CM

## 2023-09-18 DIAGNOSIS — I10 Essential (primary) hypertension: Secondary | ICD-10-CM | POA: Diagnosis not present

## 2023-09-18 DIAGNOSIS — H25813 Combined forms of age-related cataract, bilateral: Secondary | ICD-10-CM

## 2023-09-18 MED ORDER — BEVACIZUMAB CHEMO INJECTION 1.25MG/0.05ML SYRINGE FOR KALEIDOSCOPE
1.2500 mg | INTRAVITREAL | Status: AC | PRN
  Administered 2023-09-18: 1.25 mg via INTRAVITREAL

## 2023-10-09 ENCOUNTER — Emergency Department (HOSPITAL_COMMUNITY)

## 2023-10-09 ENCOUNTER — Emergency Department (HOSPITAL_COMMUNITY)
Admission: EM | Admit: 2023-10-09 | Discharge: 2023-10-09 | Disposition: A | Discharge status: Home / Self Care | Attending: Emergency Medicine | Admitting: Emergency Medicine

## 2023-10-09 ENCOUNTER — Encounter (HOSPITAL_COMMUNITY): Payer: Self-pay | Admitting: Emergency Medicine

## 2023-10-09 DIAGNOSIS — R9431 Abnormal electrocardiogram [ECG] [EKG]: Secondary | ICD-10-CM | POA: Diagnosis not present

## 2023-10-09 DIAGNOSIS — R112 Nausea with vomiting, unspecified: Secondary | ICD-10-CM | POA: Diagnosis not present

## 2023-10-09 DIAGNOSIS — E118 Type 2 diabetes mellitus with unspecified complications: Secondary | ICD-10-CM

## 2023-10-09 DIAGNOSIS — I10 Essential (primary) hypertension: Secondary | ICD-10-CM

## 2023-10-09 DIAGNOSIS — R531 Weakness: Secondary | ICD-10-CM | POA: Insufficient documentation

## 2023-10-09 DIAGNOSIS — I110 Hypertensive heart disease with heart failure: Secondary | ICD-10-CM | POA: Insufficient documentation

## 2023-10-09 DIAGNOSIS — I480 Paroxysmal atrial fibrillation: Secondary | ICD-10-CM | POA: Diagnosis not present

## 2023-10-09 DIAGNOSIS — R0789 Other chest pain: Secondary | ICD-10-CM | POA: Diagnosis present

## 2023-10-09 DIAGNOSIS — N3000 Acute cystitis without hematuria: Secondary | ICD-10-CM | POA: Insufficient documentation

## 2023-10-09 DIAGNOSIS — Z7901 Long term (current) use of anticoagulants: Secondary | ICD-10-CM | POA: Insufficient documentation

## 2023-10-09 DIAGNOSIS — E1165 Type 2 diabetes mellitus with hyperglycemia: Secondary | ICD-10-CM | POA: Insufficient documentation

## 2023-10-09 DIAGNOSIS — Z79899 Other long term (current) drug therapy: Secondary | ICD-10-CM | POA: Diagnosis not present

## 2023-10-09 DIAGNOSIS — R079 Chest pain, unspecified: Secondary | ICD-10-CM

## 2023-10-09 DIAGNOSIS — I509 Heart failure, unspecified: Secondary | ICD-10-CM | POA: Insufficient documentation

## 2023-10-09 DIAGNOSIS — Z794 Long term (current) use of insulin: Secondary | ICD-10-CM | POA: Insufficient documentation

## 2023-10-09 DIAGNOSIS — N1831 Chronic kidney disease, stage 3a: Secondary | ICD-10-CM

## 2023-10-09 DIAGNOSIS — R739 Hyperglycemia, unspecified: Secondary | ICD-10-CM

## 2023-10-09 DIAGNOSIS — E785 Hyperlipidemia, unspecified: Secondary | ICD-10-CM

## 2023-10-09 DIAGNOSIS — N179 Acute kidney failure, unspecified: Secondary | ICD-10-CM

## 2023-10-09 DIAGNOSIS — I428 Other cardiomyopathies: Secondary | ICD-10-CM

## 2023-10-09 DIAGNOSIS — I2510 Atherosclerotic heart disease of native coronary artery without angina pectoris: Secondary | ICD-10-CM | POA: Insufficient documentation

## 2023-10-09 LAB — CBC
HCT: 45.7 % (ref 39.0–52.0)
Hemoglobin: 15 g/dL (ref 13.0–17.0)
MCH: 29.3 pg (ref 26.0–34.0)
MCHC: 32.8 g/dL (ref 30.0–36.0)
MCV: 89.3 fL (ref 80.0–100.0)
Platelets: 246 K/uL (ref 150–400)
RBC: 5.12 MIL/uL (ref 4.22–5.81)
RDW: 12.2 % (ref 11.5–15.5)
WBC: 10 K/uL (ref 4.0–10.5)
nRBC: 0 % (ref 0.0–0.2)

## 2023-10-09 LAB — I-STAT VENOUS BLOOD GAS, ED
Acid-base deficit: 1 mmol/L (ref 0.0–2.0)
Bicarbonate: 23.4 mmol/L (ref 20.0–28.0)
Calcium, Ion: 1.13 mmol/L — ABNORMAL LOW (ref 1.15–1.40)
HCT: 47 % (ref 39.0–52.0)
Hemoglobin: 16 g/dL (ref 13.0–17.0)
O2 Saturation: 72 %
Potassium: 4.2 mmol/L (ref 3.5–5.1)
Sodium: 134 mmol/L — ABNORMAL LOW (ref 135–145)
TCO2: 25 mmol/L (ref 22–32)
pCO2, Ven: 37.7 mmHg — ABNORMAL LOW (ref 44–60)
pH, Ven: 7.4 (ref 7.25–7.43)
pO2, Ven: 38 mmHg (ref 32–45)

## 2023-10-09 LAB — URINALYSIS, W/ REFLEX TO CULTURE (INFECTION SUSPECTED)
Bilirubin Urine: NEGATIVE
Glucose, UA: >=500 mg/dL — AB
Ketones, ur: NEGATIVE mg/dL
Nitrite: NEGATIVE
Protein, ur: 100 mg/dL — AB
Specific Gravity, Urine: 1.025 (ref 1.005–1.030)
WBC, UA: >50 WBC/hpf (ref 0–5)
pH: 5 (ref 5.0–8.0)

## 2023-10-09 LAB — COMPREHENSIVE METABOLIC PANEL WITH GFR
ALT: 17 U/L (ref 0–44)
AST: 18 U/L (ref 15–41)
Albumin: 3.6 g/dL (ref 3.5–5.0)
Alkaline Phosphatase: 85 U/L (ref 38–126)
Anion gap: 12 (ref 5–15)
BUN: 22 mg/dL (ref 8–23)
CO2: 23 mmol/L (ref 22–32)
Calcium: 8.9 mg/dL (ref 8.9–10.3)
Chloride: 98 mmol/L (ref 98–111)
Creatinine, Ser: 1.63 mg/dL — ABNORMAL HIGH (ref 0.61–1.24)
GFR, Estimated: 47 mL/min — ABNORMAL LOW (ref >60)
Glucose, Bld: 390 mg/dL — ABNORMAL HIGH (ref 70–99)
Potassium: 4.3 mmol/L (ref 3.5–5.1)
Sodium: 133 mmol/L — ABNORMAL LOW (ref 135–145)
Total Bilirubin: 1 mg/dL (ref 0.0–1.2)
Total Protein: 6.9 g/dL (ref 6.5–8.1)

## 2023-10-09 LAB — PROTIME-INR
INR: 1 (ref 0.8–1.2)
Prothrombin Time: 13.3 s (ref 11.4–15.2)

## 2023-10-09 LAB — CBG MONITORING, ED
Glucose-Capillary: 352 mg/dL — ABNORMAL HIGH (ref 70–99)
Glucose-Capillary: 285 mg/dL — ABNORMAL HIGH (ref 70–99)

## 2023-10-09 LAB — BETA-HYDROXYBUTYRIC ACID: Beta-Hydroxybutyric Acid: 0.13 mmol/L (ref 0.05–0.27)

## 2023-10-09 LAB — TROPONIN I (HIGH SENSITIVITY)
Troponin I (High Sensitivity): 20 ng/L — ABNORMAL HIGH (ref <18)
Troponin I (High Sensitivity): 21 ng/L — ABNORMAL HIGH (ref <18)

## 2023-10-09 LAB — LIPASE, BLOOD: Lipase: 28 U/L (ref 11–51)

## 2023-10-09 MED ORDER — CEPHALEXIN 250 MG PO CAPS
500.0000 mg | ORAL_CAPSULE | Freq: Once | ORAL | Status: AC
  Administered 2023-10-09: 500 mg via ORAL
  Filled 2023-10-09: qty 2

## 2023-10-09 MED ORDER — SODIUM CHLORIDE 0.9 % IV BOLUS
500.0000 mL | Freq: Once | INTRAVENOUS | Status: AC
  Administered 2023-10-09: 500 mL via INTRAVENOUS

## 2023-10-09 MED ORDER — MORPHINE SULFATE (PF) 4 MG/ML IV SOLN
4.0000 mg | Freq: Once | INTRAVENOUS | Status: AC
  Administered 2023-10-09: 4 mg via INTRAVENOUS
  Filled 2023-10-09: qty 1

## 2023-10-09 MED ORDER — CEPHALEXIN 500 MG PO CAPS: 500.0000 mg | ORAL_CAPSULE | Freq: Four times a day (QID) | ORAL | 0 refills | Status: AC

## 2023-10-09 MED ORDER — METFORMIN HCL 1000 MG PO TABS: 1000.0000 mg | ORAL_TABLET | Freq: Every day | ORAL | 0 refills | Status: DC

## 2023-10-09 MED ORDER — ONDANSETRON HCL 4 MG/2ML IJ SOLN
4.0000 mg | Freq: Once | INTRAMUSCULAR | Status: AC
  Administered 2023-10-09: 4 mg via INTRAVENOUS
  Filled 2023-10-09: qty 2

## 2023-10-09 MED ORDER — ASPIRIN 81 MG PO CHEW
324.0000 mg | CHEWABLE_TABLET | Freq: Once | ORAL | Status: AC
  Administered 2023-10-09: 324 mg via ORAL
  Filled 2023-10-09: qty 4

## 2023-10-09 NOTE — Consult Note (Addendum)
 Cardiology Consultation   Patient ID: Calvin [name] MRN: [id number]; DOB: [birth year]  Admit date: 10/09/2023 Date of Consult: 10/09/2023  PCP:  [facility]   [facility] Providers Cardiologist:  Verne Carrow, MD  Advanced Heart Failure:  Arvilla Meres, MD       Patient Profile:   Calvin [name] is a 63 y.o. male with a hx of nonobstructive CAD, hypertension, hyperlipidemia, poorly controlled diabetes mellitus on insulin, PAF on Eliquis and chronic systolic heart failure/NICM who is being seen 10/09/2023 for the evaluation of chest pain at the request of Dr. Theresia Lo.  History of Present Illness:   Calvin [name] is a 63 year old male with past medical history of nonobstructive CAD, hypertension, hyperlipidemia, poorly controlled diabetes mellitus on insulin, PAF on Eliquis and chronic systolic heart failure/NICM.  Echocardiogram obtained on 02/08/2021 showed EF 30 to 35%, grade 1 diastolic dysfunction, mildly reduced RV systolic function.  CPX in August 2022 showed severe heart failure limitation.  Cardiac MRI in September 2022 showed LVEF 45%, no LGE.  Last cardiac catheterization was performed on 04/07/2021 that showed 40% mid LAD lesion, 40% proximal to the distal left circumflex lesion, 30% distal RCA lesion, 40% proximal to mid RCA lesion, EF 55 to 65% by visual estimate, cardiac output 5.8, cardiac index 3.1, wedge pressure 10.  EF was 35 to 40% on echocardiogram in August 2023, decreased to 30 to 35% on echo on 04/21/2023.  He was last seen by heart failure service in February 2025 at which time he has been off of all cardiac medication for 3 to 4 days.  It was recommended for the patient to resume his cardiac medication.  Amlodipine was taken off, he was put back on carvedilol, Farxiga, spironolactone and losartan.  It was recommended for the patient to undergo repeat echocardiogram and refer to EP to consider ICD implantation if EF remain less  than 35%.  Repeat echocardiogram obtained on 09/11/2023 showed EF 25 to 30%, global hypokinesis, grade 1 diastolic dysfunction, normal RV, no significant valve issue.  Per patient, he has been compliant with his heart failure medication for the past few weeks.  He does mention that his usual blood sugar has been running in the 300-350 at home despite the use of insulin.  Patient was in his usual state of health until 11:30 PM on the night of 11/07/2023.  He started having an intermittent right-sided sharp jabbing sensation in the right chest.  Symptom would last from a few seconds to a few minutes before going away however keep coming back. He also had diaphoresis. On physical exam, right chest is sore to the touch, symptom also worsens with deep inspiration and shoulder rotation.  He eventually sought medical attention at [facility] emergency room.  Blood work showed creatinine slightly elevated at 1.63, baseline creatinine 1.3.  Troponin 20, second troponin is pending.  Chest x-ray showed no acute finding.  EKG showed sinus rhythm with chronic T wave inversion in the lateral leads however also has minimal T wave inversion in the inferior leads which is new when compared to the previous EKG.  Cardiology service consulted for chest pain.   Past Medical History:  Diagnosis Date   Adenoidal hypertrophy    Adrenal nodule 09/21/2020   AF (paroxysmal atrial fibrillation) 03/10/2018   AKI (acute kidney injury) 06/17/2019   Arthritis    Cataract    Mixed OU   Chronic anticoagulation 10/15/2019   Chronic systolic heart failure 11/22/2015   Coronary  artery disease    a. cath in 08/2017 showing mild nonobstructive CAD with scattered 20-30% stenosis.    Diabetic neuropathy 09/26/2017   Diabetic retinopathy    NPDR OU   Essential hypertension 11/22/2015   Foot pain, bilateral 02/01/2019   Gastroesophageal reflux disease 05/10/2017   Heart attack    While living in [place]   History of adenomatous colonic  polyps 05/19/2011   05/2011 9 adenomas 08/2017 5 adenomas (dimin) Recall 2022   History of COVID-19 06/18/2019   HLD (hyperlipidemia) 11/20/2015   Hypoglycemia due to insulin 10/15/2019   Insomnia 05/10/2017   Intermittent claudication 05/25/2020   Mild neurocognitive disorder due to Alzheimer's disease, possible 08/24/2021   Nonischemic cardiomyopathy    a. EF 20-25% by echo in 09/2017 with cath showing mild CAD. b.  Last echo 12/2017 EF 35-40%, grade 2 DD.   Obesity    Onychomycosis of multiple toenails with type 2 diabetes mellitus and peripheral neuropathy 05/10/2017   OSA on CPAP 05/10/2017   Right sided weakness 06/17/2019   Type II diabetes mellitus 11/20/2015   Unsteady gait 12/20/2018    Past Surgical History:  Procedure Laterality Date   BIOPSY  11/22/2021   Procedure: BIOPSY;  Surgeon: Hilarie Fredrickson, MD;  Location: [facility];  Service: Gastroenterology;;   COLONOSCOPY  05/13/11, 08/2017   9 adenomas   COLONOSCOPY WITH PROPOFOL N/A 11/22/2021   Procedure: COLONOSCOPY WITH PROPOFOL;  Surgeon: Hilarie Fredrickson, MD;  Location: [facility];  Service: Gastroenterology;  Laterality: N/A;   ESOPHAGOGASTRODUODENOSCOPY (EGD) WITH PROPOFOL N/A 11/22/2021   Procedure: ESOPHAGOGASTRODUODENOSCOPY (EGD) WITH PROPOFOL;  Surgeon: Hilarie Fredrickson, MD;  Location: [facility];  Service: Gastroenterology;  Laterality: N/A;   FOREARM SURGERY     MUSCLE BIOPSY     POLYPECTOMY     POLYPECTOMY  11/22/2021   Procedure: POLYPECTOMY;  Surgeon: Hilarie Fredrickson, MD;  Location: [facility];  Service: Gastroenterology;;   RIGHT/LEFT HEART CATH AND CORONARY ANGIOGRAPHY N/A 08/25/2017   Procedure: RIGHT/LEFT HEART CATH AND CORONARY ANGIOGRAPHY;  Surgeon: Kathleene Hazel, MD;  Location: [facility];  Service: Cardiovascular;  Laterality: N/A;   RIGHT/LEFT HEART CATH AND CORONARY ANGIOGRAPHY N/A 10/01/2019   Procedure: RIGHT/LEFT HEART CATH AND CORONARY ANGIOGRAPHY;  Surgeon: Dolores Patty, MD;   Location: [facility];  Service: Cardiovascular;  Laterality: N/A;   RIGHT/LEFT HEART CATH AND CORONARY ANGIOGRAPHY N/A 04/07/2021   Procedure: RIGHT/LEFT HEART CATH AND CORONARY ANGIOGRAPHY;  Surgeon: Dolores Patty, MD;  Location: [facility];  Service: Cardiovascular;  Laterality: N/A;     Home Medications:  Prior to Admission medications   Medication Sig Start Date End Date Taking? Authorizing Provider  alum & mag hydroxide-simeth (MAALOX MAX) 400-400-40 MG/5ML suspension Take 15 mLs by mouth every 6 (six) hours as needed for indigestion. Patient not taking: Reported on 08/21/2023 08/20/22   Lonell Grandchild, MD  amitriptyline (ELAVIL) 25 MG tablet Take 25 mg by mouth at bedtime. Patient not taking: Reported on 08/21/2023 08/16/21   [provider]  amLODipine (NORVASC) 10 MG tablet Take 10 mg by mouth daily. Patient not taking: Reported on 08/21/2023    [provider]  apixaban (ELIQUIS) 5 MG TABS tablet Take 1 tablet (5 mg total) by mouth 2 (two) times daily. 08/21/23   Milford, Anderson Malta, FNP  bisacodyl (DULCOLAX) 5 MG EC tablet Take 2 tablets (10 mg total) by mouth every other day. Take at bedtime Patient not taking:  Reported on 08/21/2023 12/04/20   Iva Boop, MD  Blood Glucose Monitoring Suppl (ONETOUCH VERIO FLEX SYSTEM) w/Device KIT 1 each by Other route daily. Patient not taking: Reported on 08/21/2023 10/28/21   [provider]  Blood Pressure KIT Use as directed to check blood pressure twice daily. Patient not taking: Reported on 08/21/2023 06/29/22   Gaston Islam., NP  carvedilol (COREG) 6.25 MG tablet Take 1 tablet (6.25 mg total) by mouth 2 (two) times daily with a meal. 08/21/23   Milford, Anderson Malta, FNP  dapagliflozin propanediol (FARXIGA) 10 MG TABS tablet Take 1 tablet (10 mg total) by mouth every morning. 08/21/23   Milford, Anderson Malta, FNP  docusate sodium (COLACE) 100 MG capsule Take 100 mg by mouth 2 (two) times  daily. Patient not taking: Reported on 08/21/2023    [provider]  donepezil (ARICEPT) 10 MG tablet TAKE ONE TABLET BY MOUTH EVERYDAY AT BEDTIME Patient not taking: Reported on 08/21/2023 03/23/22   Marcos Eke, [name]  ezetimibe (ZETIA) 10 MG tablet Take 1 tablet (10 mg total) by mouth every morning. 08/21/23   Milford, Anderson Malta, FNP  glucose blood (ONETOUCH VERIO) test strip 1 each by Other route 2 (two) times daily. And lancets 2/day Patient not taking: Reported on 08/21/2023 09/15/21   Romero Belling, MD  insulin degludec (TRESIBA FLEXTOUCH) 200 UNIT/ML FlexTouch Pen Inject 54 Units into the skin daily. And pen needles 1/day Patient not taking: Reported on 08/21/2023 09/15/21   Romero Belling, MD  insulin glargine (LANTUS) 100 UNIT/ML injection Inject 36 Units into the skin at bedtime. Patient not taking: Reported on 08/21/2023    [provider]  insulin lispro (HUMALOG) 100 UNIT/ML injection Inject 16 Units into the skin 3 (three) times daily before meals. Patient not taking: Reported on 08/21/2023    [provider]  Insulin Syringe-Needle U-100 (INSULIN SYRINGE .3CC/29GX1/2") 29G X 1/2" 0.3 ML MISC Inject 1 Syringe 3 (three) times daily as directed. Check blood sugar three times daily. Dx: E11.9 Patient not taking: Reported on 08/21/2023 05/10/17   Kirt Boys, DO  lactulose (CEPHULAC) 10 g packet Take 30 mLs by mouth as needed. Patient not taking: Reported on 08/21/2023    [provider]  Lancets [facility] DELICA PLUS [place]) MISC USE TWICE DAILY Patient not taking: Reported on 08/21/2023 10/30/19   Romero Belling, MD  losartan (COZAAR) 25 MG tablet Take 1 tablet (25 mg total) by mouth daily. 08/21/23   Jacklynn Ganong, FNP  metFORMIN (GLUCOPHAGE) 1000 MG tablet Take 1,000 mg by mouth daily. Patient not taking: Reported on 08/21/2023 05/13/22   [provider]  metoCLOPramide (REGLAN) 10 MG tablet Take 10 mg by mouth 2 (two) times  daily. Patient not taking: Reported on 08/21/2023    [provider]  nitroGLYCERIN (NITROSTAT) 0.4 MG SL tablet Place 1 tablet (0.4 mg total) under the tongue every 5 (five) minutes as needed for chest pain. Patient not taking: Reported on 08/21/2023 10/27/17   Kirt Boys, DO  ondansetron (ZOFRAN-ODT) 4 MG disintegrating tablet Take 1 tablet (4 mg total) by mouth every 8 (eight) hours as needed for nausea or vomiting. Patient not taking: Reported on 08/21/2023 09/05/21   Benjiman Core, MD  pantoprazole (PROTONIX) 40 MG tablet TAKE ONE TABLET BY MOUTH EVERY MORNING and TAKE ONE TABLET BY MOUTH EVERYDAY AT BEDTIME Patient not taking: Reported on 08/21/2023 07/13/22   Charlott Holler, MD  polyethylene glycol (MIRALAX / GLYCOLAX) 17 g packet  Take 17 g by mouth 2 (two) times daily. Patient not taking: Reported on 08/21/2023 10/06/20   Iva Boop, MD  REPATHA 140 MG/ML SOSY INJECT 1 PEN INTO THE SKIN EVERY 14 DAYS Patient not taking: Reported on 08/21/2023 09/08/20   [name], Bevelyn Buckles, MD  rosuvastatin (CRESTOR) 40 MG tablet Take 1 tablet (40 mg total) by mouth at bedtime. 08/21/23   Milford, Anderson Malta, FNP  sildenafil (VIAGRA) 100 MG tablet Take 1 tablet (100 mg total) by mouth daily as needed for erectile dysfunction. Patient not taking: Reported on 08/21/2023 09/15/21   Romero Belling, MD  spironolactone (ALDACTONE) 25 MG tablet Take 1 tablet (25 mg total) by mouth every morning. 08/21/23   Jacklynn Ganong, FNP  triamcinolone cream (KENALOG) 0.1 % Apply 1 Application topically daily. Patient not taking: Reported on 08/21/2023    [provider]    Inpatient Medications: Scheduled Meds:  Continuous Infusions:  PRN Meds:   Allergies:   No Known Allergies  Social History:   Social History   Socioeconomic History   Marital status: Single    Spouse name: Not on file   Number of children: 0   Years of education: 13   Highest education level: Some college, no degree   Occupational History   Occupation: Disability  Tobacco Use   Smoking status: Never   Smokeless tobacco: Never  Vaping Use   Vaping status: Never Used  Substance and Sexual Activity   Alcohol use: No   Drug use: No   Sexual activity: Yes    Birth control/protection: None  Other Topics Concern   Not on file  Social History Narrative   Social History   Diet?    Do you drink/eat things with caffeine? yes   Marital status?       single      Do you live in a house, apartment, assisted living, condo, trailer, etc.? yes   Is it one or more stories? One story   How many persons live in your home?   Do you have any pets in your home? (please list)    Highest level of education completed? graduate   Do you exercise?            no                          Type & how often?   Advanced Directives   Do you have a living will? no   Do you have a DNR form?                                  If not, do you want to discuss one? no   Do you have signed POA/HPOA for forms? no      Functional Status   Do you have difficulty bathing or dressing yourself? no   Do you have difficulty preparing food or eating? no   Do you have difficulty managing your medications? no   Do you have difficulty managing your finances? no   Do you have difficulty affording your medications? no   Social Drivers of Corporate investment banker Strain: Not on file  Food Insecurity: No Food Insecurity (08/25/2021)   Hunger Vital Sign    Worried About Running Out of Food in the Last Year: Never true    Ran Out of Food in the Last  Year: Never true  Transportation Needs: Unknown (02/15/2021)   PRAPARE - Administrator, Civil Service (Medical): No    Lack of Transportation (Non-Medical): Not on file  Physical Activity: Not on file  Stress: Not on file  Social Connections: Not on file  Intimate Partner Violence: Not on file    Family History:    Family History  Problem Relation Age of Onset   Heart disease  Mother    Heart attack Mother 4   Hypertension Mother    Hyperlipidemia Mother    Diabetes Mother    Heart disease Father    Heart attack Father 73   Hypertension Father    Hyperlipidemia Father    Diabetes Father    Heart attack Sister 34   Colon cancer Neg Hx    Stomach cancer Neg Hx    Esophageal cancer Neg Hx    Rectal cancer Neg Hx    Colon polyps Neg Hx    Lung disease Neg Hx      ROS:  Please see the history of present illness.   All other ROS reviewed and negative.     Physical Exam/Data:   Vitals:   10/09/23 1115 10/09/23 1121  BP: 111/75   Pulse: 89   Resp: 16   Temp: 98.3 F (36.8 C)   SpO2: 100%   Weight:  74.5 kg  Height:  5\' 7"  (1.702 m)   No intake or output data in the 24 hours ending 10/09/23 1401    10/09/2023   11:21 AM 08/21/2023   12:12 PM 03/22/2023   11:20 AM  Last 3 Weights  Weight (lbs) 164 lb 3.9 oz 164 lb 3.2 oz 167 lb  Weight (kg) 74.5 kg 74.481 kg 75.751 kg     Body mass index is 25.72 kg/m.  General:  Well nourished, well developed, in no acute distress HEENT: normal Neck: no JVD Vascular: No carotid bruits; Distal pulses 2+ bilaterally Cardiac:  normal S1, S2; RRR; no murmur  Lungs:  clear to auscultation bilaterally, no wheezing, rhonchi or rales  Abd: soft, nontender, no hepatomegaly  Ext: no edema Musculoskeletal:  No deformities, BUE and BLE strength normal and equal Skin: warm and dry  Neuro:  CNs 2-12 intact, no focal abnormalities noted Psych:  Normal affect   EKG:  The EKG was personally reviewed and demonstrates: Normal sinus rhythm, T wave inversion in the lateral leads, new slightly inverted T wave in the inferior lead when compared to the previous EKG. Telemetry:  Telemetry was personally reviewed and demonstrates: Normal sinus rhythm, no significant ventricular ectopy.  Relevant CV Studies:  Cath 04/07/2021   Mid LAD lesion is 40% stenosed.   Prox Cx to Dist Cx lesion is 40% stenosed.   Dist RCA lesion is  30% stenosed.   Prox RCA to Mid RCA lesion is 40% stenosed.   The left ventricular ejection fraction is 55-65% by visual estimate.   Findings:   Ao = 144/69  LV = 144/9 RA = 3 RV = 30/6 PA = 26/9 (16) PCW = 10 Fick cardiac output/index = 5.8/3.1 SVR = 1370 PVR =  1.0 WU Ao sat = 95% PA sat = 73%, 74% SVC sat = 79%   Assessment: Mild non-obstructive CAD EF recovered now 55-60% Normal hemodynamics    Plan/Discussion:    Medical therapy.     Echo 09/11/2023 1. Left ventricular ejection fraction, by estimation, is 25 to 30%. The  left ventricle has severely  decreased function. The left ventricle  demonstrates global hypokinesis. There is mild concentric left ventricular  hypertrophy. Left ventricular diastolic   parameters are consistent with Grade I diastolic dysfunction (impaired  relaxation). The average left ventricular global longitudinal strain is  -6.1 %. The global longitudinal strain is abnormal.   2. Right ventricular systolic function is normal. The right ventricular  size is normal.   3. The mitral valve is normal in structure. No evidence of mitral valve  regurgitation. No evidence of mitral stenosis.   4. The aortic valve is tricuspid. There is mild calcification of the  aortic valve. Aortic valve regurgitation is not visualized. Aortic valve  sclerosis/calcification is present, without any evidence of aortic  stenosis.   5. The inferior vena cava is normal in size with greater than 50%  respiratory variability, suggesting right atrial pressure of 3 mmHg.    Laboratory Data:  High Sensitivity Troponin:   Recent Labs  Lab 10/09/23 1130  TROPONINIHS 20*     Chemistry Recent Labs  Lab 10/09/23 1130 10/09/23 1208  NA 133* 134*  K 4.3 4.2  CL 98  --   CO2 23  --   GLUCOSE 390*  --   BUN 22  --   CREATININE 1.63*  --   CALCIUM 8.9  --   GFRNONAA 47*  --   ANIONGAP 12  --     Recent Labs  Lab 10/09/23 1130  PROT 6.9  ALBUMIN 3.6  AST 18   ALT 17  ALKPHOS 85  BILITOT 1.0   Lipids No results for input(s): "CHOL", "TRIG", "HDL", "LABVLDL", "LDLCALC", "CHOLHDL" in the last 168 hours.  Hematology Recent Labs  Lab 10/09/23 1130 10/09/23 1208  WBC 10.0  --   RBC 5.12  --   HGB 15.0 16.0  HCT 45.7 47.0  MCV 89.3  --   MCH 29.3  --   MCHC 32.8  --   RDW 12.2  --   PLT 246  --    Thyroid No results for input(s): "TSH", "FREET4" in the last 168 hours.  BNPNo results for input(s): "BNP", "PROBNP" in the last 168 hours.  DDimer No results for input(s): "DDIMER" in the last 168 hours.   Radiology/Studies:  DG Chest 2 View Result Date: 10/09/2023 CLINICAL DATA:  Chest pain. EXAM: CHEST - 2 VIEW COMPARISON:  08/20/2022. FINDINGS: Bilateral lung fields are clear. Bilateral costophrenic angles are clear. Normal cardio-mediastinal silhouette. No acute osseous abnormalities. The soft tissues are within normal limits. IMPRESSION: No active cardiopulmonary disease. Electronically Signed   By: Jules Schick M.D.   On: 10/09/2023 12:01     Assessment and Plan:   Chest pain  - Patient has a history of nonobstructive CAD with last cardiac catheterization in October 2022.  Chest pain started last night in the right upper pectoral area.  Chest pain location is sore to the touch, pain worse with deep inspiration and shoulder rotation.  First troponin was 20, pending second troponin.  EKG shows sinus rhythm with chronic T wave inversion in the lateral leads, however new slight T wave inversion in the inferior lead.  Will review EKG with MD, pending second troponin, if second troponin is negative, unlikely to pursue any further workup as inpatient given atypical symptom.  May consider outpatient coronary CT if symptoms persist on follow-up  CAD: Last cardiac catheterization on 04/07/2021 showed 40% mid LAD lesion, 40% proximal to distal left circumflex lesion, 30% distal RCA lesion, 40% proximal to  mid RCA lesion.  Nonischemic cardiomyopathy:  Nonobstructive disease on previous cardiac catheterization.  EF is slightly worse on the most recent echocardiogram down to 25 to 30% when compared to the previous echocardiogram in September 2024 at which time her EF was 30 to 35%.  Patient appears to be euvolemic on exam.  Although he was off of all his cardiac medication for several days in February, patient says he has been compliant with heart failure therapy in the past few weeks.  With persistently low EF, he would be a candidate for ICD.  Consider referral to EP service as outpatient.    PAF: On carvedilol and Eliquis.  Currently maintaining sinus rhythm.  Hypertension: Blood pressure well-controlled  Hyperlipidemia: On Zetia and Crestor  Uncontrolled diabetes mellitus: Per patient, he has been compliant with insulin at home.  However even at home his blood sugar has been running around 300-350 range.  Mild AKI: baseline Cr 1.3, arrived with Cr of 1.6. Euvolemic on exam    Risk Assessment/Risk Scores:        New York Heart Association (NYHA) Functional Class NYHA Class II  CHA2DS2-VASc Score = 4   This indicates a 4.8% annual risk of stroke. The patient's score is based upon: CHF History: 1 HTN History: 1 Diabetes History: 1 Stroke History: 0 Vascular Disease History: 1 Age Score: 0 Gender Score: 0         For questions or updates, please contact [facility] Please consult [email or web address] for contact info under    Ramond Dial, [place]  10/09/2023 2:01 PM   Patient seen and examined. Agree with assessment and plan. Calvin [name] is a 63 year old African-American male who has a history of documented nonischemic cardiomyopathy with reduced LV function over the past 8 to 9 years.  EF had been in the 35 to 40% range.  Catheterization in October 2022 showed 40% mid LAD stenosis, 40% proximal to distal circumflex stenosis, and 40% proximal to mid and 30% distal RCA lesions.  He has been on guideline directed  medical therapy including Farxiga, carvedilol, spironolactone, and apparently has been on losartan rather than Eliquis.  His most recent echo Doppler study on September 11, 2023 showed slight further reduction in LV function with EF of 25 to 30% with global hypokinesis.  Patient has poorly controlled diabetes mellitus and uses insulin.  Last evening he started to notice a jabbing sensation in his right chest.  Symptoms were nonexertional.  He admits to chest soreness.  Presently, his blood pressure is relatively stable at 118/75.  Pulse is regular in the 80s.  There is no significant JVD.  He has definite chest wall tenderness to palpation along the right pectoral region.  Lungs are fairly clear there is no wheezing or rhonchi.  Abdomen is soft and nontender.  There is no lower extremity edema.  Neurologic exam is grossly nonfocal.  EKG shows previously noted anterolateral T wave abnormality.  There are subtle 9 diagnostic inferior T wave changes.  Initial troponin is 20 which is consistent with his prior troponin blood levels.  He is maintaining sinus rhythm without any recent atrial fibrillation and continues to be on carvedilol and Eliquis.  He has hyperlipidemia currently on rosuvastatin 40 mg, Zetia 10 mg, and Repatha every 14 days injection.  Chest x-ray shows clear lung fields and clear costophrenic angles bilaterally.  There is no active cardiopulmonary disease.  Clinically, I believe patient is stable cardiac wise.  His chest pain is not cardiac  in etiology.  He admits that he has been lifting and moving furniture, materials and boxes which I suspect has resulted in some chest wall tenderness leading to the sensation.  Creatinine today is mildly increased at 1.63 with increased BUN suggesting a mild component of dehydration.  Second troponin is still outstanding.  Apparently February, patient had missed several days of his cardiac medications.  However he subsequently has been taking them admits to compliance.   He is unaware of any recent tachycardia.  With his persistent low EF he may be candidate for prophylactic ICD.  He is followed by Dr. Gala Romney.  Initial troponin was 20, second troponin just came back at 21.  Probably okay to discharge home with follow-up with Dr. Gala Romney as outpatient.  Lennette Bihari, MD, [facility] 10/09/2023 2:54 PM

## 2023-10-09 NOTE — ED Triage Notes (Signed)
 PT complains of increased urination, blood sugar greater than 400, weakness, dizziness, light headed, and chest pain started last night. Pt has not had any insulin today.

## 2023-10-09 NOTE — Discharge Instructions (Signed)
 You were seen in the emergency department for your chest pain and high blood sugar.  You did have some new changes on your EKG but your heart enzymes were normal and you were not having a heart attack today.  Cardiology evaluated you and believe that your pain is more likely related to muscle pain with your recent lifting and you can continue to take Tylenol every 6 hours as needed for pain.  I have given you a referral to cardiology to follow-up with your cardiologist to have your symptoms rechecked.  Your blood sugar was high here but you had no complications from your sugar being high.  You should make sure that you are taking your home medications as prescribed and can follow-up with your primary doctor for blood sugar recheck and for further blood sugar management.  You also had a urinary tract infection and I have given you a course of antibiotics and you should complete this as prescribed.  You should return to the emergency department if you are having blood sugars too high to read, worsening chest pain, severe shortness of breath or any other new or concerning symptoms.

## 2023-10-09 NOTE — ED Provider Notes (Signed)
 [place] EMERGENCY DEPARTMENT AT [facility] Provider Note   CSN: [id number] Arrival date & time: 10/09/23  1047     History  Chief Complaint  Patient presents with   Weakness   Hyperglycemia   Chest Pain    Calvin [name] is a 63 y.o. male.  Patient is a 63 year old male with a past medical history of hypertension, CAD, CHF, A-fib on Eliquis, diabetes presenting to the emergency department with chest pain and hyperglycemia.  The patient states last night while laying down for bed he suddenly developed chest pain.  He states that it feels like a pressure in his chest and has been constant since it started.  He states he has associated shortness of breath and diaphoresis.  He states he has had nausea and vomiting.  He states he feels lightheaded and dizzy.  He states that his blood sugar last night was 400.  He states that he does have a history of uncontrolled hyperglycemia.  He states he has had increased urinary frequency but denies any dysuria or hematuria.  The history is provided by the patient.  Weakness Associated symptoms: chest pain   Hyperglycemia Associated symptoms: chest pain and weakness   Chest Pain Associated symptoms: weakness        Home Medications Prior to Admission medications   Medication Sig Start Date End Date Taking? Authorizing Provider  cephALEXin (KEFLEX) 500 MG capsule Take 1 capsule (500 mg total) by mouth 4 (four) times daily for 7 days. 10/09/23 10/16/23 Yes Kingsley, Benetta Spar K, DO  alum & mag hydroxide-simeth (MAALOX MAX) 400-400-40 MG/5ML suspension Take 15 mLs by mouth every 6 (six) hours as needed for indigestion. Patient not taking: Reported on 08/21/2023 08/20/22   Lonell Grandchild, MD  amitriptyline (ELAVIL) 25 MG tablet Take 25 mg by mouth at bedtime. Patient not taking: Reported on 08/21/2023 08/16/21   [provider]  amLODipine (NORVASC) 10 MG tablet Take 10 mg by mouth daily. Patient not taking: Reported on  08/21/2023    [provider]  apixaban (ELIQUIS) 5 MG TABS tablet Take 1 tablet (5 mg total) by mouth 2 (two) times daily. 08/21/23   Milford, Anderson Malta, FNP  bisacodyl (DULCOLAX) 5 MG EC tablet Take 2 tablets (10 mg total) by mouth every other day. Take at bedtime Patient not taking: Reported on 08/21/2023 12/04/20   Iva Boop, MD  Blood Glucose Monitoring Suppl (ONETOUCH VERIO FLEX SYSTEM) w/Device KIT 1 each by Other route daily. Patient not taking: Reported on 08/21/2023 10/28/21   [provider]  Blood Pressure KIT Use as directed to check blood pressure twice daily. Patient not taking: Reported on 08/21/2023 06/29/22   Gaston Islam., NP  carvedilol (COREG) 6.25 MG tablet Take 1 tablet (6.25 mg total) by mouth 2 (two) times daily with a meal. 08/21/23   Milford, Anderson Malta, FNP  dapagliflozin propanediol (FARXIGA) 10 MG TABS tablet Take 1 tablet (10 mg total) by mouth every morning. 08/21/23   Milford, Anderson Malta, FNP  docusate sodium (COLACE) 100 MG capsule Take 100 mg by mouth 2 (two) times daily. Patient not taking: Reported on 08/21/2023    [provider]  donepezil (ARICEPT) 10 MG tablet TAKE ONE TABLET BY MOUTH EVERYDAY AT BEDTIME Patient not taking: Reported on 08/21/2023 03/23/22   Marcos Eke, [name]  ezetimibe (ZETIA) 10 MG tablet Take 1 tablet (10 mg total) by mouth every morning. 08/21/23   Jacklynn Ganong, FNP  glucose  blood (ONETOUCH VERIO) test strip 1 each by Other route 2 (two) times daily. And lancets 2/day Patient not taking: Reported on 08/21/2023 09/15/21   Romero Belling, MD  insulin degludec (TRESIBA FLEXTOUCH) 200 UNIT/ML FlexTouch Pen Inject 54 Units into the skin daily. And pen needles 1/day Patient not taking: Reported on 08/21/2023 09/15/21   Romero Belling, MD  insulin glargine (LANTUS) 100 UNIT/ML injection Inject 36 Units into the skin at bedtime. Patient not taking: Reported on 08/21/2023    [provider]  insulin lispro  (HUMALOG) 100 UNIT/ML injection Inject 16 Units into the skin 3 (three) times daily before meals. Patient not taking: Reported on 08/21/2023    [provider]  Insulin Syringe-Needle U-100 (INSULIN SYRINGE .3CC/29GX1/2") 29G X 1/2" 0.3 ML MISC Inject 1 Syringe 3 (three) times daily as directed. Check blood sugar three times daily. Dx: E11.9 Patient not taking: Reported on 08/21/2023 05/10/17   Kirt Boys, DO  lactulose (CEPHULAC) 10 g packet Take 30 mLs by mouth as needed. Patient not taking: Reported on 08/21/2023    [provider]  Lancets [place] DELICA PLUS [place]) MISC USE TWICE DAILY Patient not taking: Reported on 08/21/2023 10/30/19   Romero Belling, MD  losartan (COZAAR) 25 MG tablet Take 1 tablet (25 mg total) by mouth daily. 08/21/23   Jacklynn Ganong, FNP  metFORMIN (GLUCOPHAGE) 1000 MG tablet Take 1 tablet (1,000 mg total) by mouth daily. 10/09/23   Rexford Maus, DO  metoCLOPramide (REGLAN) 10 MG tablet Take 10 mg by mouth 2 (two) times daily. Patient not taking: Reported on 08/21/2023    [provider]  nitroGLYCERIN (NITROSTAT) 0.4 MG SL tablet Place 1 tablet (0.4 mg total) under the tongue every 5 (five) minutes as needed for chest pain. Patient not taking: Reported on 08/21/2023 10/27/17   Kirt Boys, DO  ondansetron (ZOFRAN-ODT) 4 MG disintegrating tablet Take 1 tablet (4 mg total) by mouth every 8 (eight) hours as needed for nausea or vomiting. Patient not taking: Reported on 08/21/2023 09/05/21   Benjiman Core, MD  pantoprazole (PROTONIX) 40 MG tablet TAKE ONE TABLET BY MOUTH EVERY MORNING and TAKE ONE TABLET BY MOUTH EVERYDAY AT BEDTIME Patient not taking: Reported on 08/21/2023 07/13/22   Charlott Holler, MD  polyethylene glycol (MIRALAX / GLYCOLAX) 17 g packet Take 17 g by mouth 2 (two) times daily. Patient not taking: Reported on 08/21/2023 10/06/20   Iva Boop, MD  REPATHA 140 MG/ML SOSY INJECT 1 PEN INTO THE SKIN EVERY 14  DAYS Patient not taking: Reported on 08/21/2023 09/08/20   [name], Bevelyn Buckles, MD  rosuvastatin (CRESTOR) 40 MG tablet Take 1 tablet (40 mg total) by mouth at bedtime. 08/21/23   Milford, Anderson Malta, FNP  sildenafil (VIAGRA) 100 MG tablet Take 1 tablet (100 mg total) by mouth daily as needed for erectile dysfunction. Patient not taking: Reported on 08/21/2023 09/15/21   Romero Belling, MD  spironolactone (ALDACTONE) 25 MG tablet Take 1 tablet (25 mg total) by mouth every morning. 08/21/23   Jacklynn Ganong, FNP  triamcinolone cream (KENALOG) 0.1 % Apply 1 Application topically daily. Patient not taking: Reported on 08/21/2023    [provider]      Allergies    Patient has no known allergies.    Review of Systems   Review of Systems  Cardiovascular:  Positive for chest pain.  Neurological:  Positive for weakness.    Physical Exam Updated Vital Signs BP 119/79   Pulse  86   Temp 98.3 F (36.8 C)   Resp (!) 21   Ht 5\' 7"  (1.702 m)   Wt 74.5 kg   SpO2 100%   BMI 25.72 kg/m  Physical Exam Vitals and nursing note reviewed.  Constitutional:      General: He is not in acute distress.    Appearance: He is well-developed.  HENT:     Head: Normocephalic and atraumatic.  Eyes:     Extraocular Movements: Extraocular movements intact.  Cardiovascular:     Rate and Rhythm: Normal rate and regular rhythm.     Pulses:          Radial pulses are 2+ on the right side and 2+ on the left side.     Heart sounds: Normal heart sounds.  Pulmonary:     Effort: Pulmonary effort is normal.     Breath sounds: Normal breath sounds.  Abdominal:     Palpations: Abdomen is soft.     Tenderness: There is abdominal tenderness (Suprapubic).  Musculoskeletal:        General: Normal range of motion.     Cervical back: Normal range of motion and neck supple.     Right lower leg: No edema.     Left lower leg: No edema.  Skin:    General: Skin is warm and dry.  Neurological:     General: No  focal deficit present.     Mental Status: He is alert and oriented to person, place, and time.  Psychiatric:        Mood and Affect: Mood normal.        Behavior: Behavior normal.     ED Results / Procedures / Treatments   Labs (all labs ordered are listed, but only abnormal results are displayed) Labs Reviewed  COMPREHENSIVE METABOLIC PANEL WITH GFR - Abnormal; Notable for the following components:      Result Value   Sodium 133 (*)    Glucose, Bld 390 (*)    Creatinine, Ser 1.63 (*)    GFR, Estimated 47 (*)    All other components within normal limits  URINALYSIS, W/ REFLEX TO CULTURE (INFECTION SUSPECTED) - Abnormal; Notable for the following components:   APPearance TURBID (*)    Glucose, UA >=500 (*)    Hgb urine dipstick MODERATE (*)    Protein, ur 100 (*)    Leukocytes,Ua LARGE (*)    Bacteria, UA MANY (*)    All other components within normal limits  CBG MONITORING, ED - Abnormal; Notable for the following components:   Glucose-Capillary 352 (*)    All other components within normal limits  I-STAT VENOUS BLOOD GAS, ED - Abnormal; Notable for the following components:   pCO2, Ven 37.7 (*)    Sodium 134 (*)    Calcium, Ion 1.13 (*)    All other components within normal limits  CBG MONITORING, ED - Abnormal; Notable for the following components:   Glucose-Capillary 285 (*)    All other components within normal limits  TROPONIN I (HIGH SENSITIVITY) - Abnormal; Notable for the following components:   Troponin I (High Sensitivity) 20 (*)    All other components within normal limits  TROPONIN I (HIGH SENSITIVITY) - Abnormal; Notable for the following components:   Troponin I (High Sensitivity) 21 (*)    All other components within normal limits  URINE CULTURE  CBC  PROTIME-INR  LIPASE, BLOOD  BETA-HYDROXYBUTYRIC ACID    EKG None  Radiology DG Chest 2  View Result Date: 10/09/2023 CLINICAL DATA:  Chest pain. EXAM: CHEST - 2 VIEW COMPARISON:  08/20/2022. FINDINGS:  Bilateral lung fields are clear. Bilateral costophrenic angles are clear. Normal cardio-mediastinal silhouette. No acute osseous abnormalities. The soft tissues are within normal limits. IMPRESSION: No active cardiopulmonary disease. Electronically Signed   By: Jules Schick M.D.   On: 10/09/2023 12:01    Procedures Procedures    Medications Ordered in ED Medications  cephALEXin (KEFLEX) capsule 500 mg (has no administration in time range)  morphine (PF) 4 MG/ML injection 4 mg (4 mg Intravenous Given 10/09/23 1209)  ondansetron (ZOFRAN) injection 4 mg (4 mg Intravenous Given 10/09/23 1209)  aspirin chewable tablet 324 mg (324 mg Oral Given 10/09/23 1210)  sodium chloride 0.9 % bolus 500 mL (500 mLs Intravenous New Bag/Given 10/09/23 1250)    ED Course/ Medical Decision Making/ A&P Clinical Course as of 10/09/23 1539  Mon Oct 09, 2023  1149 EKG with new inferior T-wave inversions with sine depression and mild lateral elevation, not quite as pronounced on repeat. Will have Dr. Okey Dupre with interventional evaluate for possible STEMI. Will be given aspirin.  [VK]  1243 Initial troponin 20, will need repeat but likely needs admission for high risk chest pain. Mild AKI, hyperglycemia without DKA. Will be given gentle fluids. [VK]  1416 Cardiology PA at bedside. Pending repeat troponin and attending evaluation for disposition recommendations. [VK]  1530 Patient cleared by cardiology for outpatient follow up. UA positive for UTI. Will be given antibiotics. Patient reports symptoms significantly improved. [VK]    Clinical Course User Index [VK] Rexford Maus, DO                                 Medical Decision Making This patient presents to the ED with chief complaint(s) of chest pain, hyperglycemia with pertinent past medical history of CAD, CHF, A-fib on Eliquis, diabetes which further complicates the presenting complaint. The complaint involves an extensive differential diagnosis and also  carries with it a high risk of complications and morbidity.    The differential diagnosis includes ACS, arrhythmia, anemia, pneumonia, pneumothorax, pulmonary edema, pleural effusion, hyperglycemic crisis, dehydration, electrolyte abnormality, UTI  Additional history obtained: Additional history obtained from N/A Records reviewed outpatient cardiology records  ED Course and Reassessment: On patient's arrival he is hemodynamically stable in no acute distress.  Is hyperglycemic on arrival.  Patient's EKG does have new inferior T wave inversions and some mild elevations.  Will have cardiology evaluate, and is not quite meet STEMI criteria.  He had labs including troponin ordered by triage and will additionally have VBG and BHB to evaluate for hyperglycemic crisis.  He will be given pain and nausea control and aspirin and will be closely reassessed.  Independent labs interpretation:  The following labs were independently interpreted: hyperglycemia without DKA, UA positive for UTI, mildly elevated trop -> flat on repeat, mildly increased Cr  Independent visualization of imaging: - I independently visualized the following imaging with scope of interpretation limited to determining acute life threatening conditions related to emergency care: CXR, which revealed no acute disease  Consultation: - Consulted or discussed management/test interpretation w/ external professional: cardiology  Consideration for admission or further workup: Patient has no emergent conditions requiring admission or further work-up at this time and is stable for discharge home with primary care and cardiology follow-up  Social Determinants of health: N/A    Amount and/or Complexity  of Data Reviewed Labs: ordered. Radiology: ordered.  Risk OTC drugs. Prescription drug management.          Final Clinical Impression(s) / ED Diagnoses Final diagnoses:  Nonspecific chest pain  Acute cystitis without hematuria   Hyperglycemia  T wave inversion in EKG    Rx / DC Orders ED Discharge Orders          Ordered    cephALEXin (KEFLEX) 500 MG capsule  4 times daily        10/09/23 1532    Ambulatory referral to Cardiology       Comments: If you have not heard from the Cardiology office within the next 72 hours please call [phone].   10/09/23 1533    metFORMIN (GLUCOPHAGE) 1000 MG tablet  Daily        10/09/23 1534              Rexford Maus, DO 10/09/23 1539

## 2023-10-11 LAB — URINE CULTURE: Culture: >=100000 — AB

## 2023-10-12 ENCOUNTER — Telehealth (HOSPITAL_BASED_OUTPATIENT_CLINIC_OR_DEPARTMENT_OTHER): Payer: Self-pay

## 2023-10-12 NOTE — Telephone Encounter (Signed)
 Post ED Visit - Positive Culture Follow-up  Culture report reviewed by antimicrobial stewardship pharmacist: [facility] [x]  Lora Paula, Pharm.D. []  Celedonio Miyamoto, Pharm.D., BCPS AQ-ID []  Garvin Fila, Pharm.D., BCPS []  Georgina Pillion, Pharm.D., BCPS []  [place], [address].D., BCPS, AAHIVP []  Estella Husk, Pharm.D., BCPS, AAHIVP []  Lysle Pearl, PharmD, BCPS []  Phillips Climes, PharmD, BCPS []  Agapito Games, PharmD, BCPS []  Verlan Friends, PharmD []  Mervyn Gay, PharmD, BCPS []  Vinnie Level, PharmD  [facility] []  Len Childs, PharmD []  Greer Pickerel, PharmD []  Adalberto Cole, PharmD []  Perlie Gold, Rph []  Lonell Face) Jean Rosenthal, PharmD []  Earl Many, PharmD []  Junita Push, PharmD []  Dorna Leitz, PharmD []  Terrilee Files, PharmD []  Lynann Beaver, PharmD []  Keturah Barre, PharmD []  Loralee Pacas, PharmD []  Bernadene Person, PharmD   Positive urine culture Treated with Cephalexin, organism sensitive to the same and no further patient follow-up is required at this time.  Sandria Senter 10/12/2023, 11:51 AM

## 2023-10-16 ENCOUNTER — Encounter (INDEPENDENT_AMBULATORY_CARE_PROVIDER_SITE_OTHER): Admitting: Ophthalmology

## 2023-10-16 DIAGNOSIS — I10 Essential (primary) hypertension: Secondary | ICD-10-CM

## 2023-10-16 DIAGNOSIS — H25813 Combined forms of age-related cataract, bilateral: Secondary | ICD-10-CM

## 2023-10-16 DIAGNOSIS — H35033 Hypertensive retinopathy, bilateral: Secondary | ICD-10-CM

## 2023-10-16 DIAGNOSIS — E113413 Type 2 diabetes mellitus with severe nonproliferative diabetic retinopathy with macular edema, bilateral: Secondary | ICD-10-CM

## 2023-11-15 ENCOUNTER — Encounter (INDEPENDENT_AMBULATORY_CARE_PROVIDER_SITE_OTHER): Payer: Self-pay | Admitting: Ophthalmology

## 2023-11-15 ENCOUNTER — Ambulatory Visit (INDEPENDENT_AMBULATORY_CARE_PROVIDER_SITE_OTHER): Admitting: Ophthalmology

## 2023-11-15 DIAGNOSIS — E113413 Type 2 diabetes mellitus with severe nonproliferative diabetic retinopathy with macular edema, bilateral: Secondary | ICD-10-CM

## 2023-11-15 DIAGNOSIS — H25813 Combined forms of age-related cataract, bilateral: Secondary | ICD-10-CM

## 2023-11-15 DIAGNOSIS — I10 Essential (primary) hypertension: Secondary | ICD-10-CM

## 2023-11-15 DIAGNOSIS — H35033 Hypertensive retinopathy, bilateral: Secondary | ICD-10-CM

## 2023-11-15 MED ORDER — BEVACIZUMAB CHEMO INJECTION 1.25MG/0.05ML SYRINGE FOR KALEIDOSCOPE
1.2500 mg | INTRAVITREAL | Status: AC | PRN
  Administered 2023-11-15: 1.25 mg via INTRAVITREAL

## 2023-11-15 MED ORDER — BEVACIZUMAB CHEMO INJECTION 1.25MG/0.05ML SYRINGE FOR KALEIDOSCOPE
1.2500 mg | INTRAVITREAL | Status: AC | PRN
Start: 2023-11-15 — End: 2023-11-15
  Administered 2023-11-15: 1.25 mg via INTRAVITREAL

## 2023-11-15 NOTE — Progress Notes (Signed)
 [facility] - Clinic Note  11/15/2023     CHIEF COMPLAINT Patient presents for Retina Follow Up   HISTORY OF PRESENT ILLNESS: Calvin [name] is a 63 y.o. male who presents to the clinic today for:  HPI     Retina Follow Up   Patient presents with  Diabetic Retinopathy.  In both eyes.  This started 4.5 years ago.  Severity is moderate.  Duration of 8 weeks.  Since onset it is gradually worsening.  I, the attending physician,  performed the HPI with the patient and updated documentation appropriately.        Comments   Pt presents for 8 week retina follow up (4 weeks late), NPDR OU. Pt states vision is much worse. Pt denies FOL/floaters/pain. Pt states BS have been running high in the 300's. Pt states A1c was a little over 10.0 as of this morning.      Last edited by Kareem Cathey, MD on 11/15/2023  9:16 PM.     Delayed f/u - 8 wks instead of 4 Pt states vision is about the same   Referring physician: Angel Barba MD [address] [place], [place] [address]  HISTORICAL INFORMATION:   Selected notes from the MEDICAL RECORD NUMBER Referred by Dr. Angel Barba LEE: 09/27/21 BCVA OD 20/80 OS 20/60-2 Ocular history: NPDR, Medical history: DM   CURRENT MEDICATIONS: No current outpatient medications on file. (Ophthalmic Drugs)   No current facility-administered medications for this visit. (Ophthalmic Drugs)   Current Outpatient Medications (Other)  Medication Sig   apixaban  (ELIQUIS ) 5 MG TABS tablet Take 1 tablet (5 mg total) by mouth 2 (two) times daily.   carvedilol  (COREG ) 6.25 MG tablet Take 1 tablet (6.25 mg total) by mouth 2 (two) times daily with a meal.   dapagliflozin  propanediol (FARXIGA ) 10 MG TABS tablet Take 1 tablet (10 mg total) by mouth every morning.   ezetimibe  (ZETIA ) 10 MG tablet Take 1 tablet (10 mg total) by mouth every morning.   losartan  (COZAAR ) 25 MG tablet Take 1 tablet (25 mg total) by mouth daily.    metFORMIN  (GLUCOPHAGE ) 1000 MG tablet Take 1 tablet (1,000 mg total) by mouth daily.   rosuvastatin  (CRESTOR ) 40 MG tablet Take 1 tablet (40 mg total) by mouth at bedtime.   spironolactone  (ALDACTONE ) 25 MG tablet Take 1 tablet (25 mg total) by mouth every morning.   alum & mag hydroxide-simeth (MAALOX MAX) 400-400-40 MG/5ML suspension Take 15 mLs by mouth every 6 (six) hours as needed for indigestion. (Patient not taking: Reported on 11/15/2023)   amitriptyline  (ELAVIL ) 25 MG tablet Take 25 mg by mouth at bedtime. (Patient not taking: Reported on 08/21/2023)   amLODipine (NORVASC) 10 MG tablet Take 10 mg by mouth daily. (Patient not taking: Reported on 11/15/2023)   bisacodyl  (DULCOLAX) 5 MG EC tablet Take 2 tablets (10 mg total) by mouth every other day. Take at bedtime (Patient not taking: Reported on 08/21/2023)   Blood Glucose Monitoring Suppl (ONETOUCH VERIO FLEX SYSTEM) w/Device KIT 1 each by Other route daily. (Patient not taking: Reported on 08/21/2023)   Blood Pressure KIT Use as directed to check blood pressure twice daily. (Patient not taking: Reported on 11/15/2023)   docusate sodium  (COLACE) 100 MG capsule Take 100 mg by mouth 2 (two) times daily. (Patient not taking: Reported on 11/15/2023)   donepezil  (ARICEPT ) 10 MG tablet TAKE ONE TABLET BY MOUTH EVERYDAY AT BEDTIME (Patient not taking: Reported on 08/21/2023)  glucose blood (ONETOUCH VERIO) test strip 1 each by Other route 2 (two) times daily. And lancets 2/day (Patient not taking: Reported on 08/21/2023)   insulin  degludec (TRESIBA  FLEXTOUCH) 200 UNIT/ML FlexTouch Pen Inject 54 Units into the skin daily. And pen needles 1/day (Patient not taking: Reported on 08/21/2023)   insulin  glargine (LANTUS ) 100 UNIT/ML injection Inject 36 Units into the skin at bedtime. (Patient not taking: Reported on 11/15/2023)   insulin  lispro (HUMALOG) 100 UNIT/ML injection Inject 16 Units into the skin 3 (three) times daily before meals. (Patient not taking:  Reported on 11/15/2023)   Insulin  Syringe-Needle U-100 (INSULIN  SYRINGE .3CC/29GX1/2") 29G X 1/2" 0.3 ML MISC Inject 1 Syringe 3 (three) times daily as directed. Check blood sugar three times daily. Dx: E11.9 (Patient not taking: Reported on 08/21/2023)   lactulose (CEPHULAC) 10 g packet Take 30 mLs by mouth as needed. (Patient not taking: Reported on 11/15/2023)   Lancets (ONETOUCH DELICA PLUS LANCET30G) MISC USE TWICE DAILY (Patient not taking: Reported on 08/21/2023)   metoCLOPramide  (REGLAN ) 10 MG tablet Take 10 mg by mouth 2 (two) times daily. (Patient not taking: Reported on 11/15/2023)   nitroGLYCERIN  (NITROSTAT ) 0.4 MG SL tablet Place 1 tablet (0.4 mg total) under the tongue every 5 (five) minutes as needed for chest pain. (Patient not taking: Reported on 08/21/2023)   ondansetron  (ZOFRAN -ODT) 4 MG disintegrating tablet Take 1 tablet (4 mg total) by mouth every 8 (eight) hours as needed for nausea or vomiting. (Patient not taking: Reported on 08/21/2023)   pantoprazole  (PROTONIX ) 40 MG tablet TAKE ONE TABLET BY MOUTH EVERY MORNING and TAKE ONE TABLET BY MOUTH EVERYDAY AT BEDTIME (Patient not taking: Reported on 11/15/2023)   polyethylene glycol (MIRALAX  / GLYCOLAX ) 17 g packet Take 17 g by mouth 2 (two) times daily. (Patient not taking: Reported on 08/21/2023)   REPATHA  140 MG/ML SOSY INJECT 1 PEN INTO THE SKIN EVERY 14 DAYS (Patient not taking: No sig reported)   sildenafil  (VIAGRA ) 100 MG tablet Take 1 tablet (100 mg total) by mouth daily as needed for erectile dysfunction. (Patient not taking: Reported on 08/21/2023)   triamcinolone  cream (KENALOG ) 0.1 % Apply 1 Application topically daily. (Patient not taking: Reported on 11/15/2023)   No current facility-administered medications for this visit. (Other)   REVIEW OF SYSTEMS: ROS   Positive for: Gastrointestinal, Neurological, Musculoskeletal, Endocrine, Cardiovascular, Eyes, Respiratory Negative for: Constitutional, Skin, Genitourinary, HENT,  Psychiatric, Allergic/Imm, Heme/Lymph Last edited by Carrington Clack, COT on 11/15/2023  2:27 PM.       ALLERGIES No Known Allergies  PAST MEDICAL HISTORY Past Medical History:  Diagnosis Date   Adenoidal hypertrophy    Adrenal nodule 09/21/2020   AF (paroxysmal atrial fibrillation) 03/10/2018   AKI (acute kidney injury) 06/17/2019   Arthritis    Cataract    Mixed OU   Chronic anticoagulation 10/15/2019   Chronic systolic heart failure 11/22/2015   Coronary artery disease    a. cath in 08/2017 showing mild nonobstructive CAD with scattered 20-30% stenosis.    Diabetic neuropathy 09/26/2017   Diabetic retinopathy    NPDR OU   Essential hypertension 11/22/2015   Foot pain, bilateral 02/01/2019   Gastroesophageal reflux disease 05/10/2017   Heart attack    While living in [place]   History of adenomatous colonic polyps 05/19/2011   05/2011 9 adenomas 08/2017 5 adenomas (dimin) Recall 2022   History of COVID-19 06/18/2019   HLD (hyperlipidemia) 11/20/2015   Hypoglycemia due to insulin  10/15/2019   Insomnia  05/10/2017   Intermittent claudication 05/25/2020   Mild neurocognitive disorder due to Alzheimer's disease, possible 08/24/2021   Nonischemic cardiomyopathy    a. EF 20-25% by echo in 09/2017 with cath showing mild CAD. b.  Last echo 12/2017 EF 35-40%, grade 2 DD.   Obesity    Onychomycosis of multiple toenails with type 2 diabetes mellitus and peripheral neuropathy 05/10/2017   OSA on CPAP 05/10/2017   Right sided weakness 06/17/2019   Type II diabetes mellitus 11/20/2015   Unsteady gait 12/20/2018   Past Surgical History:  Procedure Laterality Date   BIOPSY  11/22/2021   Procedure: BIOPSY;  Surgeon: Tobin Forts, MD;  Location: [facility];  Service: Gastroenterology;;   COLONOSCOPY  05/13/11, 08/2017   9 adenomas   COLONOSCOPY WITH PROPOFOL  N/A 11/22/2021   Procedure: COLONOSCOPY WITH PROPOFOL ;  Surgeon: Tobin Forts, MD;  Location: [facility];  Service:  Gastroenterology;  Laterality: N/A;   ESOPHAGOGASTRODUODENOSCOPY (EGD) WITH PROPOFOL  N/A 11/22/2021   Procedure: ESOPHAGOGASTRODUODENOSCOPY (EGD) WITH PROPOFOL ;  Surgeon: Tobin Forts, MD;  Location: [facility];  Service: Gastroenterology;  Laterality: N/A;   FOREARM SURGERY     MUSCLE BIOPSY     POLYPECTOMY     POLYPECTOMY  11/22/2021   Procedure: POLYPECTOMY;  Surgeon: Tobin Forts, MD;  Location: [facility];  Service: Gastroenterology;;   RIGHT/LEFT HEART CATH AND CORONARY ANGIOGRAPHY N/A 08/25/2017   Procedure: RIGHT/LEFT HEART CATH AND CORONARY ANGIOGRAPHY;  Surgeon: Odie Benne, MD;  Location: [facility];  Service: Cardiovascular;  Laterality: N/A;   RIGHT/LEFT HEART CATH AND CORONARY ANGIOGRAPHY N/A 10/01/2019   Procedure: RIGHT/LEFT HEART CATH AND CORONARY ANGIOGRAPHY;  Surgeon: Mardell Shade, MD;  Location: [facility];  Service: Cardiovascular;  Laterality: N/A;   RIGHT/LEFT HEART CATH AND CORONARY ANGIOGRAPHY N/A 04/07/2021   Procedure: RIGHT/LEFT HEART CATH AND CORONARY ANGIOGRAPHY;  Surgeon: Mardell Shade, MD;  Location: [facility];  Service: Cardiovascular;  Laterality: N/A;   FAMILY HISTORY Family History  Problem Relation Age of Onset   Heart disease Mother    Heart attack Mother 69   Hypertension Mother    Hyperlipidemia Mother    Diabetes Mother    Heart disease Father    Heart attack Father 28   Hypertension Father    Hyperlipidemia Father    Diabetes Father    Heart attack Sister 81   Colon cancer Neg Hx    Stomach cancer Neg Hx    Esophageal cancer Neg Hx    Rectal cancer Neg Hx    Colon polyps Neg Hx    Lung disease Neg Hx    SOCIAL HISTORY Social History   Tobacco Use   Smoking status: Never   Smokeless tobacco: Never  Vaping Use   Vaping status: Never Used  Substance Use Topics   Alcohol use: No   Drug use: No       OPHTHALMIC EXAM:  Base Eye Exam     Visual Acuity (Snellen - Linear)        Right Left   Dist Miles 20/350 -2 20/40 -2   Dist ph [place] 20/300 -2 20/30 -1         Tonometry (Tonopen, 2:37 PM)       Right Left   Pressure 20 18         Pupils       Pupils Dark Light Shape React APD   Right PERRL 3 2 Round Brisk None  Left PERRL 3 2 Round Brisk None         Visual Fields       Left Right    Full Full         Extraocular Movement       Right Left    Full, Ortho Full, Ortho         Neuro/Psych     Oriented x3: Yes   Mood/Affect: Normal         Dilation     Both eyes: 1.0% Mydriacyl, 2.5% Phenylephrine  @ 2:38 PM           Slit Lamp and Fundus Exam     Slit Lamp Exam       Right Left   Lids/Lashes Dermatochalasis - upper lid, Meibomian gland dysfunction Dermatochalasis - upper lid, Meibomian gland dysfunction   Conjunctiva/Sclera nasal and temporal pinguecula, Melanosis nasal and temporal pinguecula, Melanosis   Cornea arcus arcus, 2+ inferior Punctate epithelial erosions   Anterior Chamber deep, clear, narrow temporal angle Deep and quiet   Iris Round and dilated, No NVI Round and dilated, No NVI   Lens 2-3+ Nuclear sclerosis, 2-3+ Cortical cataract 2-3+ Nuclear sclerosis, 2-3+ Cortical cataract   Anterior Vitreous Vitreous syneresis, mild Asteroid hyalosis Vitreous syneresis         Fundus Exam       Right Left   Disc Pink and Sharp, no NVD Pink and Sharp, no NVD, Compact   C/D Ratio 0.2 0.2   Macula Blunted foveal reflex, central edema - persistent, diffuse exudates greatest centrally, scattered MA/DBH -- all improving Blunted foveal reflex, central edema with scattered exudates -- slightly improved, scattered MA/DBH   Vessels attenuated, Tortuous, ST venule dilated attenuated, Tortuous   Periphery Attached, scattered MA/DBH, exudates greatest posteriorly Attached, scattered MA/DBH and exudates greatest posteriorly           IMAGING AND PROCEDURES  Imaging and Procedures for 11/15/2023  OCT, Retina - OU - Both  Eyes        Right Eye Quality was good. Central Foveal Thickness: 221. Progression has improved. Findings include no SRF, abnormal foveal contour, subretinal hyper-reflective material, intraretinal hyper-reflective material, intraretinal fluid, vitreomacular adhesion (Mild interval improvement in central edema with IRF/IRHM/SRHM).   Left Eye Quality was good. Central Foveal Thickness: 217. Progression has improved. Findings include no SRF, abnormal foveal contour, intraretinal hyper-reflective material, intraretinal fluid, subretinal fluid (Mild interval improvement in IRF/SRHM).   Notes  *Images captured and stored on drive  Diagnosis / Impression:  Central DME OU OD: Mild interval improvement in central edema with IRF/IRHM/SRHM OS: Mild interval improvement in IRF/SRHM  Clinical management:  See below  Abbreviations: NFP - Normal foveal profile. CME - cystoid macular edema. PED - pigment epithelial detachment. IRF - intraretinal fluid. SRF - subretinal fluid. EZ - ellipsoid zone. ERM - epiretinal membrane. ORA - outer retinal atrophy. ORT - outer retinal tubulation. SRHM - subretinal hyper-reflective material. IRHM - intraretinal hyper-reflective material      Intravitreal Injection, Pharmacologic Agent - OD - Right Eye       Time Out 11/15/2023. 3:36 PM. Confirmed correct patient, procedure, site, and patient consented.   Anesthesia Topical anesthesia was used. Anesthetic medications included Lidocaine  2%, Proparacaine 0.5%.   Procedure Preparation included 5% betadine to ocular surface, eyelid speculum. A (32g) needle was used.   Injection: 1.25 mg Bevacizumab  1.25mg /0.32ml   Route: Intravitreal, Site: Right Eye   NDC: C2662926, Lot: [id number], Expiration date: 03/07/2024  Post-op Post injection exam found visual acuity of at least counting fingers. The patient tolerated the procedure well. There were no complications. The patient received written and verbal post  procedure care education. Post injection medications were not given.      Intravitreal Injection, Pharmacologic Agent - OS - Left Eye       Time Out 11/15/2023. 3:37 PM. Confirmed correct patient, procedure, site, and patient consented.   Anesthesia Topical anesthesia was used. Anesthetic medications included Lidocaine  2%, Proparacaine 0.5%.   Procedure Preparation included 5% betadine to ocular surface, eyelid speculum. A (32g) needle was used.   Injection: 1.25 mg Bevacizumab  1.25mg /0.9ml   Route: Intravitreal, Site: Left Eye   NDC: H525437, Lot: [id number], Expiration date: 02/06/2024   Post-op Post injection exam found visual acuity of at least counting fingers. The patient tolerated the procedure well. There were no complications. The patient received written and verbal post procedure care education. Post injection medications were not given.             ASSESSMENT/PLAN:    ICD-10-CM   1. Severe nonproliferative diabetic retinopathy of both eyes with macular edema associated with type 2 diabetes mellitus (HCC)  E11.3413 OCT, Retina - OU - Both Eyes    Intravitreal Injection, Pharmacologic Agent - OD - Right Eye    Intravitreal Injection, Pharmacologic Agent - OS - Left Eye    Bevacizumab  (AVASTIN ) SOLN 1.25 mg    Bevacizumab  (AVASTIN ) SOLN 1.25 mg    2. Essential hypertension  I10     3. Hypertensive retinopathy of both eyes  H35.033     4. Combined forms of age-related cataract of both eyes  H25.813      1. Severe Non-proliferative diabetic retinopathy w/ DME, OU  - delayed f/u - 8 wks instead of 4 (3.17.25 to 05.14.25)  - pt delayed from 4 weeks to 6.5 weeks (01.29.25 to 03.17.25)  - pt delayed from 4 weeks to 12 weeks (11.06.24 to 01.29.25) due to being in the hospital multiple times  - pt delayed from 4 weeks to 5+ months (04.24.24 to 10.09.24) due to other medical procedures  - pt delayed from 4 weeks to 11 months (04.18.22 to 03.27.23) due to other health  issues  - pt was delayed from 4 weeks to 9 weeks (12.14.21 to 2.21.22) due to financial / insurance reasons  - A1c 12.0 on 09.18.24  - s/p IVA OS #1 (09.16.21), IVA OD #1 (09.21.21)             - s/p IVA OU #2 (10.19.21), #3 (11.16.21), #4 (12.14.21), #5 (02.21.22), #6 (03.21.22), #7 (04.18.22), #8 (03.27.23), #9 (04.24.23), #10 (10.03.24), #11 (11.06.24), #12 (01.29.25), #13 (03.17.25) - exam shows central edema OU, scattered IRH and severe exudates OU - FA (09.16.21) shows late leaking MA greatest posterior pole; no NV; +peripheral vascular perfusion defects -- may benefit from peripheral PRP OU  - OCT shows OD: Mild interval improvement in central edema with IRF/IRHM/SRHM; OS: Mild interval improvement in IRF/SRHM at 8 weeks  - BCVA OD improved to 20/300 from 20/400; OS improved to 20/30 from 20/40  - recommend IVA OU #14 today, 05.14.25 w/ f/u back to 6 wks - pt wishes to proceed with injection - RBA of procedure discussed, questions answered - informed consent obtained and signed - see procedure note - Avastin  informed consent obtained, re-signed and scanned, 01.29.25 (OU) - f/u 6 weeks, DFE, OCT, possible injection(s)  2,3. Hypertensive retinopathy OU - discussed importance of tight BP  control - monitor  4. Mixed Cataract OU - The symptoms of cataract, surgical options, and treatments and risks were discussed with patient. - discussed diagnosis and progression - now following with Dr. Carloyn Chi   Ophthalmic Meds Ordered this visit:  Meds ordered this encounter  Medications   Bevacizumab  (AVASTIN ) SOLN 1.25 mg   Bevacizumab  (AVASTIN ) SOLN 1.25 mg     Return in about 6 weeks (around 12/27/2023) for f/u NPDR OU, DFE, OCT, Possible Injxn.  There are no Patient Instructions on file for this visit.  This document serves as a record of services personally performed by Jeanice Millard, MD, PhD. It was created on their behalf by Morley Arabia. Bevin Bucks, OA an ophthalmic technician. The creation  of this record is the provider's dictation and/or activities during the visit.    Electronically signed by: Morley Arabia. Bevin Bucks, OA 11/19/23 3:25 PM  Jeanice Millard, M.D., Ph.D. Diseases & Surgery of the Retina and Vitreous [facility]  I have reviewed the above documentation for accuracy and completeness, and I agree with the above. Jeanice Millard, M.D., Ph.D. 11/19/23 3:27 PM   Abbreviations: M myopia (nearsighted); A astigmatism; H hyperopia (farsighted); P presbyopia; Mrx spectacle prescription;  CTL contact lenses; OD right eye; OS left eye; OU both eyes  XT exotropia; ET esotropia; PEK punctate epithelial keratitis; PEE punctate epithelial erosions; DES dry eye syndrome; MGD meibomian gland dysfunction; ATs artificial tears; PFAT's preservative free artificial tears; NSC nuclear sclerotic cataract; PSC posterior subcapsular cataract; ERM epi-retinal membrane; PVD posterior vitreous detachment; RD retinal detachment; DM diabetes mellitus; DR diabetic retinopathy; NPDR non-proliferative diabetic retinopathy; PDR proliferative diabetic retinopathy; CSME clinically significant macular edema; DME diabetic macular edema; dbh dot blot hemorrhages; CWS cotton wool spot; POAG primary open angle glaucoma; C/D cup-to-disc ratio; HVF humphrey visual field; GVF goldmann visual field; OCT optical coherence tomography; IOP intraocular pressure; BRVO Branch retinal vein occlusion; CRVO central retinal vein occlusion; CRAO central retinal artery occlusion; BRAO branch retinal artery occlusion; RT retinal tear; SB scleral buckle; PPV pars plana vitrectomy; VH Vitreous hemorrhage; PRP panretinal laser photocoagulation; IVK intravitreal kenalog ; VMT vitreomacular traction; MH Macular hole;  NVD neovascularization of the disc; NVE neovascularization elsewhere; AREDS age related eye disease study; ARMD age related macular degeneration; POAG primary open angle glaucoma; EBMD epithelial/anterior  basement membrane dystrophy; ACIOL anterior chamber intraocular lens; IOL intraocular lens; PCIOL posterior chamber intraocular lens; Phaco/IOL phacoemulsification with intraocular lens placement; PRK photorefractive keratectomy; LASIK laser assisted in situ keratomileusis; HTN hypertension; DM diabetes mellitus; COPD chronic obstructive pulmonary disease

## 2023-11-27 NOTE — Progress Notes (Unsigned)
 Cardiology Office Note:  .   Date:  11/29/2023  ID:  Calvin [name], DOB May 29, 1961, MRN [id number] PCP: [facility] Providers Cardiologist:  Antoinette Batman, MD Advanced Heart Failure:  Jules Oar, MD {  History of Present Illness: .    Chief Complaint  Patient presents with   Arm Pain    Calvin [name] is a 63 y.o. male with history of CHF, DM, nonobstructive CAD, HTN, HLD who presents for follow-up.   History of Present Illness   Calvin [name] is a 63 year old male with systolic heart failure and nonobstructive coronary artery disease who presents for follow-up.  He has been experiencing left arm pain for the past three to four weeks. The pain is sharp, described as 'something pulling', and radiates straight down the arm, remaining consistent throughout the day. He has not used Tylenol  due to concerns about interactions with his other medications but has applied a pain relief strip similar to [employer], which has provided some relief.  He has a history of systolic heart failure with an ejection fraction of 25-30%. He is uncertain about his current medication regimen but mentions that his medications were recently refilled. He has been following up with the advanced heart failure clinic and underwent a left heart catheterization in 2022, which showed mild nonobstructive disease.  He also has diabetes, which is not well controlled, with a high A1c at 12.0. He is unsure of his current diabetes management regimen but expects to receive his medications, including diabetic shots, through the mail soon.  No issues with breathing, but he gets tired quickly. He does not smoke and has never smoked.          Problem List Non-obstructive CAD -40% LAD; 40% LCX, 30% RCA 04/07/2021 2. Systolic HF -EF 25-30% 09/11/2023 -EF 30-35% 03/22/2023 -EF 35-40% 02/23/2022 -CMR negative 03/21/2021 -failed entresto  due to low BP 3. DM -A1c  12.0  4. Paroxysmal Afib 5. HTN    ROS: All other ROS reviewed and negative. Pertinent positives noted in the HPI.     Studies Reviewed: Aaron Aas   EKG Interpretation Date/Time:  Wednesday Nov 29 2023 08:26:04 EDT Ventricular Rate:  93 PR Interval:  180 QRS Duration:  88 QT Interval:  346 QTC Calculation: 430 R Axis:   -36  Text Interpretation: Normal sinus rhythm Left axis deviation Moderate voltage criteria for LVH, may be normal variant ( R in aVL , Cornell product ) Septal infarct , age undetermined T wave abnormality, consider lateral ischemia Confirmed by Jackquelyn Mass [phone]) on 11/29/2023 8:29:41 AM   TTE 09/11/2023  1. Left ventricular ejection fraction, by estimation, is 25 to 30%. The  left ventricle has severely decreased function. The left ventricle  demonstrates global hypokinesis. There is mild concentric left ventricular  hypertrophy. Left ventricular diastolic   parameters are consistent with Grade I diastolic dysfunction (impaired  relaxation). The average left ventricular global longitudinal strain is  -6.1 %. The global longitudinal strain is abnormal.   2. Right ventricular systolic function is normal. The right ventricular  size is normal.   3. The mitral valve is normal in structure. No evidence of mitral valve  regurgitation. No evidence of mitral stenosis.   4. The aortic valve is tricuspid. There is mild calcification of the  aortic valve. Aortic valve regurgitation is not visualized. Aortic valve  sclerosis/calcification is present, without any evidence of aortic  stenosis.   5. The inferior vena cava is normal  in size with greater than 50%  respiratory variability, suggesting right atrial pressure of 3 mmHg.   LHC 04/07/2021   Mid LAD lesion is 40% stenosed.   Prox Cx to Dist Cx lesion is 40% stenosed.   Dist RCA lesion is 30% stenosed.   Prox RCA to Mid RCA lesion is 40% stenosed.   The left ventricular ejection fraction is 55-65% by visual  estimate. Physical Exam:   VS:  BP 134/72 (BP Location: Right Arm, Patient Position: Sitting, Cuff Size: Normal)   Pulse 93   Ht 5\' 7"  (1.702 m)   Wt 159 lb (72.1 kg)   SpO2 98%   BMI 24.90 kg/m    Wt Readings from Last 3 Encounters:  11/29/23 159 lb (72.1 kg)  10/09/23 164 lb 3.9 oz (74.5 kg)  08/21/23 164 lb 3.2 oz (74.5 kg)    GEN: Well nourished, well developed in no acute distress NECK: No JVD; No carotid bruits CARDIAC: RRR, no murmurs, rubs, gallops RESPIRATORY:  Clear to auscultation without rales, wheezing or rhonchi  ABDOMEN: Soft, non-tender, non-distended EXTREMITIES:  No edema; No deformity  ASSESSMENT AND PLAN: .   Assessment and Plan    Left arm pain due to tendonitis Left arm pain for weeks, improved with [facility]. Physical exam suggests tendonitis or inflammation. Recommended treatment with scheduled Tylenol  and heat/ice application. - Prescribe Tylenol  800 mg TID for 7 days. - Advise use of heat and ice to affected area. - Consider physical therapy if symptoms worsen.   Chronic systolic heart failure Chronic systolic heart failure with EF 25-30%. Unclear medication adherence. Previous failure with Entresto . Current medications include carvedilol , Farxiga , losartan , and spironolactone . Emphasized medication adherence to improve heart function and symptoms. - Nursing to confirm current medications. - Instruct to bring all medications to next appointment in six weeks. - Schedule follow-up with PA in six weeks to review medications.  Paroxysmal atrial fibrillation Paroxysmal atrial fibrillation with current EKG showing sinus rhythm. Emphasized continuation of anticoagulation with Eliquis  and beta-blocker therapy. - Continue Eliquis  5 mg BID. - Ensure continuation of beta-blocker therapy.  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 Not on aspirin  due to Eliquis . Emphasized medication adherence.  Type 2 diabetes mellitus,  uncontrolled Uncontrolled type 2 diabetes mellitus with recent A1c of 12.0. Emphasized need for better diabetes management to improve overall health and control hyperlipidemia.  Hyperlipidemia, unspecified Hyperlipidemia likely exacerbated by uncontrolled diabetes. Recent LDL was 81. Supposed to be on Repatha , but medication adherence is uncertain.              Follow-up: Return in about 6 weeks (around 01/10/2024).  Signed, Gigi Kyle. Rolm Clos, MD, [facility]  [facility]  [address] [place], [place] [address] [phone]  8:58 AM

## 2023-11-29 ENCOUNTER — Ambulatory Visit: Attending: Cardiovascular Disease | Admitting: Cardiovascular Disease

## 2023-11-29 ENCOUNTER — Encounter: Payer: Self-pay | Admitting: Cardiovascular Disease

## 2023-11-29 VITALS — BP 134/72 | HR 93 | Ht 67.0 in | Wt 159.0 lb

## 2023-11-29 DIAGNOSIS — I2510 Atherosclerotic heart disease of native coronary artery without angina pectoris: Secondary | ICD-10-CM | POA: Diagnosis not present

## 2023-11-29 DIAGNOSIS — M79602 Pain in left arm: Secondary | ICD-10-CM | POA: Diagnosis not present

## 2023-11-29 DIAGNOSIS — I480 Paroxysmal atrial fibrillation: Secondary | ICD-10-CM

## 2023-11-29 DIAGNOSIS — I10 Essential (primary) hypertension: Secondary | ICD-10-CM

## 2023-11-29 DIAGNOSIS — E782 Mixed hyperlipidemia: Secondary | ICD-10-CM

## 2023-11-29 DIAGNOSIS — I5022 Chronic systolic (congestive) heart failure: Secondary | ICD-10-CM | POA: Diagnosis not present

## 2023-11-29 MED ORDER — ACETAMINOPHEN 325 MG PO TABS: 800.0000 mg | ORAL_TABLET | Freq: Three times a day (TID) | ORAL | 0 refills | Status: DC

## 2023-11-29 NOTE — Patient Instructions (Addendum)
 Medication Instructions:   Tylenol  800 mg  ( 2 and 1/2 tablets)  three times a day for 7 days   *If you need a refill on your cardiac medications before your next appointment, please call your pharmacy*   Other Instructions  you can use heat and ice on the area 10 -15 min.  Bring all your medication to the next appointment   Lab Work:  Not needed   Testing/Procedures: Not needed   Follow-Up: At [facility], you and your health needs are our priority.  As part of our continuing mission to provide you with exceptional heart care, we have created designated Provider Care Teams.  These Care Teams include your primary Cardiologist (physician) and Advanced Practice Providers (APPs -  Physician Assistants and Nurse Practitioners) who all work together to provide you with the care you need, when you need it.     Your next appointment:   6 week(s)  The format for your next appointment:   In Person  Provider:   Callie Goodrich, [name], Hao Meng, [name], or Marlana Silvan, NP      Then,( Heart failure clinic  - Dr Abel Hoe )will plan to see you again in 6 month(s).   Other Instructions  you can use heat and ice on the area 10 -15 min.  Bring all your medication to the next appointment

## 2023-12-04 ENCOUNTER — Telehealth: Payer: Self-pay | Admitting: Cardiovascular Disease

## 2023-12-04 NOTE — Telephone Encounter (Signed)
 Patient walked in today requesting a letter to be able to turn in by 12/07/2023 that states he cannot do [employer] duty.  Please reach out to patient to discuss.  Thank you.

## 2023-12-05 NOTE — Telephone Encounter (Signed)
 Patient identification verified by 2 forms. [name], RN     Called and spoke to patient  Relayed provider recommendations  Patient aware:   Calvin Lincoln, MD to [name]  Me    12/04/23  3:27 PM I do not see a reason he cannot do jury duty.  Melodee Spruce T. Rolm Clos, MD, [facility]  [facility] [address], [address] [place], [place] [address] [phone] 3:27 PM   12/04/23  9:06 AM Maryjane Snider, Kurtis routed this conversation to Me  O'Neal, Cathay Clonts, MD [name] KR   12/04/23  9:05 AM Note Patient walked in today requesting a letter to be able to turn in by 12/07/2023 that states he cannot do Jury duty.  Please reach out to patient to discuss.  Thank you.     This encounter is not signed. The conversation may still be ongoing.   Patient verbalized understanding but disagreed with provider response. Patient verbalized "ill finding another doctor then because i told Dr. Rolm Clos at my appointment last week that I have been having chest pain and arm pain and nothing was done and now I can not get a letter to be off jury duty".   This RN reviewed notes from recent OV. No restrictions noted by provider.  Patient verbalized he had no further questions at this time.

## 2023-12-05 NOTE — Telephone Encounter (Signed)
 Patient identification verified by 2 forms. [name], RN     Called patient. [id number]

## 2023-12-06 ENCOUNTER — Ambulatory Visit: Admitting: Podiatry

## 2023-12-19 NOTE — Progress Notes (Shared)
 [facility] - Clinic Note  12/27/2023     CHIEF COMPLAINT Patient presents for No chief complaint on file.   HISTORY OF PRESENT ILLNESS: Calvin [name] is a 63 y.o. male who presents to the clinic today for:       Referring physician: Angel Barba MD [address], [address] [place], [place] [address]  HISTORICAL INFORMATION:   Selected notes from the MEDICAL RECORD NUMBER Referred by Dr. Angel Barba LEE: 09/27/21 BCVA OD 20/80 OS 20/60-2 Ocular history: NPDR, Medical history: DM   CURRENT MEDICATIONS: No current outpatient medications on file. (Ophthalmic Drugs)   No current facility-administered medications for this visit. (Ophthalmic Drugs)   Current Outpatient Medications (Other)  Medication Sig   acetaminophen  (TYLENOL ) 325 MG tablet Take 2.5 tablets (812.5 mg total) by mouth 3 (three) times daily.   alum & mag hydroxide-simeth (MAALOX MAX) 400-400-40 MG/5ML suspension Take 15 mLs by mouth every 6 (six) hours as needed for indigestion. (Patient not taking: Reported on 08/21/2023)   amitriptyline  (ELAVIL ) 25 MG tablet Take 25 mg by mouth at bedtime. (Patient not taking: Reported on 08/21/2023)   amLODipine (NORVASC) 10 MG tablet Take 10 mg by mouth daily. (Patient not taking: Reported on 08/21/2023)   apixaban  (ELIQUIS ) 5 MG TABS tablet Take 1 tablet (5 mg total) by mouth 2 (two) times daily.   bisacodyl  (DULCOLAX) 5 MG EC tablet Take 2 tablets (10 mg total) by mouth every other day. Take at bedtime (Patient not taking: Reported on 08/21/2023)   Blood Glucose Monitoring Suppl (ONETOUCH VERIO FLEX SYSTEM) w/Device KIT 1 each by Other route daily. (Patient not taking: Reported on 08/21/2023)   Blood Pressure KIT Use as directed to check blood pressure twice daily. (Patient not taking: Reported on 08/21/2023)   carvedilol  (COREG ) 6.25 MG tablet Take 1 tablet (6.25 mg total) by mouth 2 (two) times daily with a meal.   dapagliflozin  propanediol (FARXIGA )  10 MG TABS tablet Take 1 tablet (10 mg total) by mouth every morning.   docusate sodium  (COLACE) 100 MG capsule Take 100 mg by mouth 2 (two) times daily. (Patient not taking: Reported on 08/21/2023)   donepezil  (ARICEPT ) 10 MG tablet TAKE ONE TABLET BY MOUTH EVERYDAY AT BEDTIME (Patient not taking: Reported on 08/21/2023)   ezetimibe  (ZETIA ) 10 MG tablet Take 1 tablet (10 mg total) by mouth every morning.   glucose blood (ONETOUCH VERIO) test strip 1 each by Other route 2 (two) times daily. And lancets 2/day (Patient not taking: Reported on 08/21/2023)   insulin  degludec (TRESIBA  FLEXTOUCH) 200 UNIT/ML FlexTouch Pen Inject 54 Units into the skin daily. And pen needles 1/day (Patient not taking: Reported on 08/21/2023)   insulin  glargine (LANTUS ) 100 UNIT/ML injection Inject 36 Units into the skin at bedtime. (Patient not taking: Reported on 08/21/2023)   insulin  lispro (HUMALOG) 100 UNIT/ML injection Inject 16 Units into the skin 3 (three) times daily before meals. (Patient not taking: Reported on 08/21/2023)   Insulin  Syringe-Needle U-100 (INSULIN  SYRINGE .3CC/29GX1/2) 29G X 1/2 0.3 ML MISC Inject 1 Syringe 3 (three) times daily as directed. Check blood sugar three times daily. Dx: E11.9 (Patient not taking: Reported on 08/21/2023)   lactulose (CEPHULAC) 10 g packet Take 30 mLs by mouth as needed. (Patient not taking: Reported on 08/21/2023)   Lancets (ONETOUCH DELICA PLUS LANCET30G) MISC USE TWICE DAILY (Patient not taking: Reported on 08/21/2023)   losartan  (COZAAR ) 25 MG tablet Take 1 tablet (25 mg total) by mouth daily.  metFORMIN  (GLUCOPHAGE ) 1000 MG tablet Take 1 tablet (1,000 mg total) by mouth daily.   metoCLOPramide  (REGLAN ) 10 MG tablet Take 10 mg by mouth 2 (two) times daily. (Patient not taking: Reported on 08/21/2023)   nitroGLYCERIN  (NITROSTAT ) 0.4 MG SL tablet Place 1 tablet (0.4 mg total) under the tongue every 5 (five) minutes as needed for chest pain. (Patient not taking: Reported on  08/21/2023)   ondansetron  (ZOFRAN -ODT) 4 MG disintegrating tablet Take 1 tablet (4 mg total) by mouth every 8 (eight) hours as needed for nausea or vomiting. (Patient not taking: Reported on 08/21/2023)   pantoprazole  (PROTONIX ) 40 MG tablet TAKE ONE TABLET BY MOUTH EVERY MORNING and TAKE ONE TABLET BY MOUTH EVERYDAY AT BEDTIME (Patient not taking: Reported on 11/15/2023)   polyethylene glycol (MIRALAX  / GLYCOLAX ) 17 g packet Take 17 g by mouth 2 (two) times daily. (Patient not taking: Reported on 08/21/2023)   REPATHA  140 MG/ML SOSY INJECT 1 PEN INTO THE SKIN EVERY 14 DAYS (Patient not taking: No sig reported)   rosuvastatin  (CRESTOR ) 40 MG tablet Take 1 tablet (40 mg total) by mouth at bedtime.   sildenafil  (VIAGRA ) 100 MG tablet Take 1 tablet (100 mg total) by mouth daily as needed for erectile dysfunction. (Patient not taking: Reported on 08/21/2023)   spironolactone  (ALDACTONE ) 25 MG tablet Take 1 tablet (25 mg total) by mouth every morning.   triamcinolone  cream (KENALOG ) 0.1 % Apply 1 Application topically daily. (Patient not taking: Reported on 08/21/2023)   No current facility-administered medications for this visit. (Other)   REVIEW OF SYSTEMS:     ALLERGIES No Known Allergies  PAST MEDICAL HISTORY Past Medical History:  Diagnosis Date   Adenoidal hypertrophy    Adrenal nodule 09/21/2020   AF (paroxysmal atrial fibrillation) 03/10/2018   AKI (acute kidney injury) 06/17/2019   Arthritis    Cataract    Mixed OU   Chronic anticoagulation 10/15/2019   Chronic systolic heart failure 11/22/2015   Coronary artery disease    a. cath in 08/2017 showing mild nonobstructive CAD with scattered 20-30% stenosis.    Diabetic neuropathy 09/26/2017   Diabetic retinopathy    NPDR OU   Essential hypertension 11/22/2015   Foot pain, bilateral 02/01/2019   Gastroesophageal reflux disease 05/10/2017   Heart attack    While living in [place]   History of adenomatous colonic polyps 05/19/2011    05/2011 9 adenomas 08/2017 5 adenomas (dimin) Recall 2022   History of COVID-19 06/18/2019   HLD (hyperlipidemia) 11/20/2015   Hypoglycemia due to insulin  10/15/2019   Insomnia 05/10/2017   Intermittent claudication 05/25/2020   Mild neurocognitive disorder due to Alzheimer's disease, possible 08/24/2021   Nonischemic cardiomyopathy    a. EF 20-25% by echo in 09/2017 with cath showing mild CAD. b.  Last echo 12/2017 EF 35-40%, grade 2 DD.   Obesity    Onychomycosis of multiple toenails with type 2 diabetes mellitus and peripheral neuropathy 05/10/2017   OSA on CPAP 05/10/2017   Right sided weakness 06/17/2019   Type II diabetes mellitus 11/20/2015   Unsteady gait 12/20/2018   Past Surgical History:  Procedure Laterality Date   BIOPSY  11/22/2021   Procedure: BIOPSY;  Surgeon: Tobin Forts, MD;  Location: [facility];  Service: Gastroenterology;;   COLONOSCOPY  05/13/11, 08/2017   9 adenomas   COLONOSCOPY WITH PROPOFOL  N/A 11/22/2021   Procedure: COLONOSCOPY WITH PROPOFOL ;  Surgeon: Tobin Forts, MD;  Location: [facility];  Service: Gastroenterology;  Laterality: N/A;  ESOPHAGOGASTRODUODENOSCOPY (EGD) WITH PROPOFOL  N/A 11/22/2021   Procedure: ESOPHAGOGASTRODUODENOSCOPY (EGD) WITH PROPOFOL ;  Surgeon: Tobin Forts, MD;  Location: [facility];  Service: Gastroenterology;  Laterality: N/A;   FOREARM SURGERY     MUSCLE BIOPSY     POLYPECTOMY     POLYPECTOMY  11/22/2021   Procedure: POLYPECTOMY;  Surgeon: Tobin Forts, MD;  Location: [facility];  Service: Gastroenterology;;   RIGHT/LEFT HEART CATH AND CORONARY ANGIOGRAPHY N/A 08/25/2017   Procedure: RIGHT/LEFT HEART CATH AND CORONARY ANGIOGRAPHY;  Surgeon: Odie Benne, MD;  Location: [facility];  Service: Cardiovascular;  Laterality: N/A;   RIGHT/LEFT HEART CATH AND CORONARY ANGIOGRAPHY N/A 10/01/2019   Procedure: RIGHT/LEFT HEART CATH AND CORONARY ANGIOGRAPHY;  Surgeon: Mardell Shade, MD;  Location: [facility]  CV [facility];  Service: Cardiovascular;  Laterality: N/A;   RIGHT/LEFT HEART CATH AND CORONARY ANGIOGRAPHY N/A 04/07/2021   Procedure: RIGHT/LEFT HEART CATH AND CORONARY ANGIOGRAPHY;  Surgeon: Mardell Shade, MD;  Location: [facility];  Service: Cardiovascular;  Laterality: N/A;   FAMILY HISTORY Family History  Problem Relation Age of Onset   Heart disease Mother    Heart attack Mother 61   Hypertension Mother    Hyperlipidemia Mother    Diabetes Mother    Heart disease Father    Heart attack Father 68   Hypertension Father    Hyperlipidemia Father    Diabetes Father    Heart attack Sister 74   Colon cancer Neg Hx    Stomach cancer Neg Hx    Esophageal cancer Neg Hx    Rectal cancer Neg Hx    Colon polyps Neg Hx    Lung disease Neg Hx    SOCIAL HISTORY Social History   Tobacco Use   Smoking status: Never   Smokeless tobacco: Never  Vaping Use   Vaping status: Never Used  Substance Use Topics   Alcohol use: No   Drug use: No       OPHTHALMIC EXAM:  Not recorded    IMAGING AND PROCEDURES  Imaging and Procedures for 12/27/2023           ASSESSMENT/PLAN:  No diagnosis found.  1. Severe Non-proliferative diabetic retinopathy w/ DME, OU  - delayed f/u - 8 wks instead of 4 (3.17.25 to 05.14.25)  - pt delayed from 4 weeks to 6.5 weeks (01.29.25 to 03.17.25)  - pt delayed from 4 weeks to 12 weeks (11.06.24 to 01.29.25) due to being in the hospital multiple times  - pt delayed from 4 weeks to 5+ months (04.24.24 to 10.09.24) due to other medical procedures  - pt delayed from 4 weeks to 11 months (04.18.22 to 03.27.23) due to other health issues  - pt was delayed from 4 weeks to 9 weeks (12.14.21 to 2.21.22) due to financial / insurance reasons  - A1c 12.0 on 09.18.24  - s/p IVA OS #1 (09.16.21), IVA OD #1 (09.21.21)             - s/p IVA OU #2 (10.19.21), #3 (11.16.21), #4 (12.14.21), #5 (02.21.22), #6 (03.21.22), #7 (04.18.22), #8 (03.27.23), #9  (04.24.23), #10 (10.03.24), #11 (11.06.24), #12 (01.29.25), #13 (03.17.25), #14 (05.14.25) - exam shows central edema OU, scattered IRH and severe exudates OU - FA (09.16.21) shows late leaking MA greatest posterior pole; no NV; +peripheral vascular perfusion defects -- may benefit from peripheral PRP OU  - OCT shows OD: Mild interval improvement in central edema with IRF/IRHM/SRHM; OS: Mild interval improvement in  IRF/SRHM at 8 weeks  - BCVA OD improved to 20/300 from 20/400; OS improved to 20/30 from 20/40  - recommend IVA OU #15 today, 06.25.25 w/ f/u back to 6 wks - pt wishes to proceed with injection - RBA of procedure discussed, questions answered - informed consent obtained and signed - see procedure note - Avastin  informed consent obtained, re-signed and scanned, 01.29.25 (OU) - f/u 6 weeks, DFE, OCT, possible injection(s)  2,3. Hypertensive retinopathy OU - discussed importance of tight BP control - monitor  4. Mixed Cataract OU - The symptoms of cataract, surgical options, and treatments and risks were discussed with patient. - discussed diagnosis and progression - now following with Dr. Carloyn Chi   Ophthalmic Meds Ordered this visit:  No orders of the defined types were placed in this encounter.    No follow-ups on file.  There are no Patient Instructions on file for this visit.  This document serves as a record of services personally performed by Jeanice Millard, MD, PhD. It was created on their behalf by Angelia Kelp, an ophthalmic technician. The creation of this record is the provider's dictation and/or activities during the visit.    Electronically signed by: Angelia Kelp, OA, 12/19/23  9:35 AM   Jeanice Millard, M.D., Ph.D. Diseases & Surgery of the Retina and Vitreous [facility]   Abbreviations: M myopia (nearsighted); A astigmatism; H hyperopia (farsighted); P presbyopia; Mrx spectacle prescription;  CTL contact lenses; OD right  eye; OS left eye; OU both eyes  XT exotropia; ET esotropia; PEK punctate epithelial keratitis; PEE punctate epithelial erosions; DES dry eye syndrome; MGD meibomian gland dysfunction; ATs artificial tears; PFAT's preservative free artificial tears; NSC nuclear sclerotic cataract; PSC posterior subcapsular cataract; ERM epi-retinal membrane; PVD posterior vitreous detachment; RD retinal detachment; DM diabetes mellitus; DR diabetic retinopathy; NPDR non-proliferative diabetic retinopathy; PDR proliferative diabetic retinopathy; CSME clinically significant macular edema; DME diabetic macular edema; dbh dot blot hemorrhages; CWS cotton wool spot; POAG primary open angle glaucoma; C/D cup-to-disc ratio; HVF humphrey visual field; GVF goldmann visual field; OCT optical coherence tomography; IOP intraocular pressure; BRVO Branch retinal vein occlusion; CRVO central retinal vein occlusion; CRAO central retinal artery occlusion; BRAO branch retinal artery occlusion; RT retinal tear; SB scleral buckle; PPV pars plana vitrectomy; VH Vitreous hemorrhage; PRP panretinal laser photocoagulation; IVK intravitreal kenalog ; VMT vitreomacular traction; MH Macular hole;  NVD neovascularization of the disc; NVE neovascularization elsewhere; AREDS age related eye disease study; ARMD age related macular degeneration; POAG primary open angle glaucoma; EBMD epithelial/anterior basement membrane dystrophy; ACIOL anterior chamber intraocular lens; IOL intraocular lens; PCIOL posterior chamber intraocular lens; Phaco/IOL phacoemulsification with intraocular lens placement; PRK photorefractive keratectomy; LASIK laser assisted in situ keratomileusis; HTN hypertension; DM diabetes mellitus; COPD chronic obstructive pulmonary disease

## 2023-12-27 ENCOUNTER — Encounter (INDEPENDENT_AMBULATORY_CARE_PROVIDER_SITE_OTHER): Admitting: Ophthalmology

## 2023-12-27 DIAGNOSIS — E113413 Type 2 diabetes mellitus with severe nonproliferative diabetic retinopathy with macular edema, bilateral: Secondary | ICD-10-CM

## 2023-12-27 DIAGNOSIS — H35033 Hypertensive retinopathy, bilateral: Secondary | ICD-10-CM

## 2023-12-27 DIAGNOSIS — I10 Essential (primary) hypertension: Secondary | ICD-10-CM

## 2023-12-27 DIAGNOSIS — H25813 Combined forms of age-related cataract, bilateral: Secondary | ICD-10-CM

## 2024-01-10 NOTE — Progress Notes (Unsigned)
 Cardiology Office Note:  .   Date:  01/10/2024  ID:  Calvin [name], DOB 09/25/1960, MRN [id number] PCP: [facility] Providers Cardiologist:  Lonni Cash, MD Advanced Heart Failure:  Toribio Fuel, MD {  History of Present Illness: .   Calvin [name] is a 63 y.o. male with history of NICM/HFrEF, PAF, nonobstructive CAD, type 2 diabetes, hyperlipidemia     NICM Admitted for chest pain 08/2017, right left heart catheterization with mild nonobstructive CAD.  EF 35 to 40% EF 35 to 40% 01/2019 CPX 02/2021 CPX 8/22 with severe HF limitation.  BP rest: 110/64 BP peak: 114/60  Peak VO2: 13.3 (52% predicted peak VO2)  VE/VCO2 slope:  57  OUES: 0.95  Peak RER: 1.07  Ventilatory Threshold: 10.3 (40% predicted or measured peak VO2)  Peak RR 80  Peak Ventilation:  65.3  VE/MVV:  60%  PETCO2 at peak:  26  O2pulse:  10  (75% predicted O2pulse)  09/2019 right left heart catheterization nonobstructive CAD, RHC showed well compensated heart failure with preserved cardiac output. EF 30 to 35% 02/2021 Cardiac MRI 03/2021 EF 45%, no LGE Right/left heart catheterization 04/2021 mild nonobstructive CAD, LVEF recovered, preserved cardiac output. EF 35 to 40% 02/2022 Normal Lexiscan  07/2022 EF 30 to 35% 03/2023 (not taking meds) EF 25 to 30% 09/2023, no significant valvular disease.  Most recent echocardiogram.  Social history       Patient with history of NICM/chronic HFrEF, followed by advanced heart failure.  Has had multiple heart caths in the past showing stable hemodynamics with nonobstructive disease.  Most recently has sustained a drop in EF now around 25 to 30%, there has been question of compliance.  He was seen in the hospital 10/2023 for chest pain with very mildly elevated troponins, he was discharged and recommended to follow-up with cardiology who felt presentation was most likely tendinitis.  He is recommended to follow-up today to  further titrate GDMT.  NICM/HFrEF - R/LHC 04/2021 nonobstructive CAD, compensated hemodynamics - EF 25 to 30% 09/2023 (decline from 30 to 35% previously). Patient has recent drop in EF in the setting of questionable medication compliance.   Sending referral to EP for consideration of ICD Failed Entresto  2 times due to low BP  PAF Maintaining sinus rhythm.  Nonobstructive CAD Hyperlipidemia Noted on heart cath above, no anginal symptoms  Type 2 diabetes Uncontrolled    ROS: Denies: Chest pain, shortness of breath, orthopnea, peripheral edema, palpitations, decreased exercise intolerance, fatigue, lightheadedness.   Studies Reviewed: .         Risk Assessment/Calculations:   {Does this patient have ATRIAL FIBRILLATION?:[id number]} No BP recorded.  {Refresh Note OR Click here to enter BP  :1}***       Physical Exam:   VS:  There were no vitals taken for this visit.   Wt Readings from Last 3 Encounters:  11/29/23 159 lb (72.1 kg)  10/09/23 164 lb 3.9 oz (74.5 kg)  08/21/23 164 lb 3.2 oz (74.5 kg)    GEN: Well nourished, well developed in no acute distress NECK: No JVD; No carotid bruits CARDIAC: ***RRR, no murmurs, rubs, gallops RESPIRATORY:  Clear to auscultation without rales, wheezing or rhonchi  ABDOMEN: Soft, non-tender, non-distended EXTREMITIES:  No edema; No deformity   ASSESSMENT AND PLAN: .         {Are you ordering a CV Procedure (e.g. stress test, cath, DCCV, TEE, etc)?   Press F2        :  [id number]}  Dispo: ***  Signed, Thom LITTIE Sluder, [name]

## 2024-01-11 ENCOUNTER — Encounter: Payer: Self-pay | Admitting: Physician Assistant

## 2024-01-11 ENCOUNTER — Telehealth: Payer: Self-pay | Admitting: Pharmacy Technician

## 2024-01-11 ENCOUNTER — Other Ambulatory Visit (HOSPITAL_COMMUNITY): Payer: Self-pay

## 2024-01-11 ENCOUNTER — Ambulatory Visit: Attending: Physician Assistant | Admitting: Cardiology

## 2024-01-11 VITALS — BP 122/62 | HR 96 | Ht 66.0 in | Wt 161.0 lb

## 2024-01-11 DIAGNOSIS — I2510 Atherosclerotic heart disease of native coronary artery without angina pectoris: Secondary | ICD-10-CM | POA: Diagnosis not present

## 2024-01-11 DIAGNOSIS — I5022 Chronic systolic (congestive) heart failure: Secondary | ICD-10-CM | POA: Diagnosis not present

## 2024-01-11 DIAGNOSIS — E1159 Type 2 diabetes mellitus with other circulatory complications: Secondary | ICD-10-CM

## 2024-01-11 DIAGNOSIS — E782 Mixed hyperlipidemia: Secondary | ICD-10-CM

## 2024-01-11 DIAGNOSIS — I480 Paroxysmal atrial fibrillation: Secondary | ICD-10-CM

## 2024-01-11 DIAGNOSIS — E1165 Type 2 diabetes mellitus with hyperglycemia: Secondary | ICD-10-CM

## 2024-01-11 MED ORDER — REPATHA 140 MG/ML ~~LOC~~ SOSY: 140.0000 mg | PREFILLED_SYRINGE | SUBCUTANEOUS | 3 refills | Status: DC

## 2024-01-11 MED ORDER — DAPAGLIFLOZIN PROPANEDIOL 10 MG PO TABS: 10.0000 mg | ORAL_TABLET | Freq: Every morning | ORAL | 3 refills | Status: DC

## 2024-01-11 NOTE — Telephone Encounter (Signed)
 Pharmacy Patient Advocate Encounter  Received notification from [facility] that Prior Authorization for Repatha   has been APPROVED from 01/11/24 to 07/03/24. Ran test claim, Copay is $12.15- one month. This test claim was processed through [facility]- copay amounts may vary at other pharmacies due to pharmacy/plan contracts, or as the patient moves through the different stages of their insurance plan.   PA #/Case ID/Reference #: [id number]

## 2024-01-11 NOTE — Telephone Encounter (Signed)
 Pharmacy Patient Advocate Encounter   Received notification from CoverMyMeds that prior authorization for Repatha  is required/requested.   Insurance verification completed.   The patient is insured through [facility] .   Per test claim: PA required; PA submitted to above mentioned insurance via CoverMyMeds Key/confirmation #/EOC [id number] Status is pending

## 2024-01-11 NOTE — Patient Instructions (Signed)
 Medication Instructions:  NO CHANGES *If you need a refill on your cardiac medications before your next appointment, please call your pharmacy*  Lab Work: NO LABS If you have labs (blood work) drawn today and your tests are completely normal, you will receive your results only by: MyChart Message (if you have MyChart) OR A paper copy in the mail If you have any lab test that is abnormal or we need to change your treatment, we will call you to review the results.  Testing/Procedures: NO TESTING  Follow-Up: At [facility], you and your health needs are our priority.  As part of our continuing mission to provide you with exceptional heart care, our providers are all part of one team.  This team includes your primary Cardiologist (physician) and Advanced Practice Providers or APPs (Physician Assistants and Nurse Practitioners) who all work together to provide you with the care you need, when you need it.  Your next appointment:   3 month(s)  Provider:   Darryle Decent, MD

## 2024-01-12 NOTE — Progress Notes (Signed)
 [facility] - Clinic Note  01/18/2024     CHIEF COMPLAINT Patient presents for Retina Follow Up   HISTORY OF PRESENT ILLNESS: Calvin [name] is a 63 y.o. male who presents to the clinic today for:  HPI     Retina Follow Up   Patient presents with  Diabetic Retinopathy.  In both eyes.  This started 9 weeks ago.  I, the attending physician,  performed the HPI with the patient and updated documentation appropriately.        Comments   Patient here for 6 weeks (9 weeks) retina follow up for NPDR OU. Patient states vision doing better than was before. No eye pain.       Last edited by Linde Wilensky, MD on 01/18/2024  3:34 PM.    Delayed f/u - 8 wks instead of 4 Pt states vision is about the same   Referring physician: Fleeta Gentile MD [address] [place], [place] [address]  HISTORICAL INFORMATION:   Selected notes from the MEDICAL RECORD NUMBER Referred by Dr. Gentile Fleeta LEE: 09/27/21 BCVA OD 20/80 OS 20/60-2 Ocular history: NPDR, Medical history: DM   CURRENT MEDICATIONS: No current outpatient medications on file. (Ophthalmic Drugs)   No current facility-administered medications for this visit. (Ophthalmic Drugs)   Current Outpatient Medications (Other)  Medication Sig   acetaminophen  (TYLENOL ) 325 MG tablet Take 2.5 tablets (812.5 mg total) by mouth 3 (three) times daily.   alum & mag hydroxide-simeth (MAALOX MAX) 400-400-40 MG/5ML suspension Take 15 mLs by mouth every 6 (six) hours as needed for indigestion.   amitriptyline  (ELAVIL ) 25 MG tablet Take 25 mg by mouth at bedtime.   amLODipine (NORVASC) 10 MG tablet Take 10 mg by mouth daily.   apixaban  (ELIQUIS ) 5 MG TABS tablet Take 1 tablet (5 mg total) by mouth 2 (two) times daily.   bisacodyl  (DULCOLAX) 5 MG EC tablet Take 2 tablets (10 mg total) by mouth every other day. Take at bedtime   Blood Glucose Monitoring Suppl (ONETOUCH VERIO FLEX SYSTEM) w/Device KIT 1 each by Other  route daily.   Blood Pressure KIT Use as directed to check blood pressure twice daily.   carvedilol  (COREG ) 6.25 MG tablet Take 1 tablet (6.25 mg total) by mouth 2 (two) times daily with a meal.   dapagliflozin  propanediol (FARXIGA ) 10 MG TABS tablet Take 1 tablet (10 mg total) by mouth every morning.   docusate sodium  (COLACE) 100 MG capsule Take 100 mg by mouth 2 (two) times daily.   donepezil  (ARICEPT ) 10 MG tablet TAKE ONE TABLET BY MOUTH EVERYDAY AT BEDTIME   Evolocumab  (REPATHA ) 140 MG/ML SOSY Inject 140 mg as directed every 14 (fourteen) days.   ezetimibe  (ZETIA ) 10 MG tablet Take 1 tablet (10 mg total) by mouth every morning.   glipiZIDE (GLUCOTROL) 5 MG tablet Take 5 mg by mouth daily.   glucose blood (ONETOUCH VERIO) test strip 1 each by Other route 2 (two) times daily. And lancets 2/day   insulin  degludec (TRESIBA  FLEXTOUCH) 200 UNIT/ML FlexTouch Pen Inject 54 Units into the skin daily. And pen needles 1/day   insulin  glargine (LANTUS ) 100 UNIT/ML injection Inject 36 Units into the skin at bedtime.   insulin  lispro (HUMALOG) 100 UNIT/ML injection Inject 16 Units into the skin 3 (three) times daily before meals.   Insulin  Syringe-Needle U-100 (INSULIN  SYRINGE .3CC/29GX1/2) 29G X 1/2 0.3 ML MISC Inject 1 Syringe 3 (three) times daily as directed. Check blood sugar three  times daily. Dx: E11.9   lactulose (CEPHULAC) 10 g packet Take 30 mLs by mouth as needed.   Lancets (ONETOUCH DELICA PLUS LANCET30G) MISC USE TWICE DAILY   losartan  (COZAAR ) 25 MG tablet Take 1 tablet (25 mg total) by mouth daily.   metFORMIN  (GLUCOPHAGE ) 1000 MG tablet Take 1 tablet (1,000 mg total) by mouth daily.   metoCLOPramide  (REGLAN ) 10 MG tablet Take 10 mg by mouth 2 (two) times daily.   nitroGLYCERIN  (NITROSTAT ) 0.4 MG SL tablet Place 1 tablet (0.4 mg total) under the tongue every 5 (five) minutes as needed for chest pain.   ondansetron  (ZOFRAN -ODT) 4 MG disintegrating tablet Take 1 tablet (4 mg total) by  mouth every 8 (eight) hours as needed for nausea or vomiting.   pantoprazole  (PROTONIX ) 40 MG tablet TAKE ONE TABLET BY MOUTH EVERY MORNING and TAKE ONE TABLET BY MOUTH EVERYDAY AT BEDTIME   polyethylene glycol (MIRALAX  / GLYCOLAX ) 17 g packet Take 17 g by mouth 2 (two) times daily.   rosuvastatin  (CRESTOR ) 40 MG tablet Take 1 tablet (40 mg total) by mouth at bedtime.   sildenafil  (VIAGRA ) 100 MG tablet Take 1 tablet (100 mg total) by mouth daily as needed for erectile dysfunction.   spironolactone  (ALDACTONE ) 25 MG tablet Take 1 tablet (25 mg total) by mouth every morning.   triamcinolone  cream (KENALOG ) 0.1 % Apply 1 Application topically daily.   No current facility-administered medications for this visit. (Other)   REVIEW OF SYSTEMS: ROS   Positive for: Gastrointestinal, Neurological, Musculoskeletal, Endocrine, Cardiovascular, Eyes, Respiratory Negative for: Constitutional, Skin, Genitourinary, HENT, Psychiatric, Allergic/Imm, Heme/Lymph Last edited by Orval Asberry RAMAN, COA on 01/18/2024  8:14 AM.        ALLERGIES No Known Allergies  PAST MEDICAL HISTORY Past Medical History:  Diagnosis Date   Adenoidal hypertrophy    Adrenal nodule 09/21/2020   AF (paroxysmal atrial fibrillation) 03/10/2018   AKI (acute kidney injury) 06/17/2019   Arthritis    Cataract    Mixed OU   Chronic anticoagulation 10/15/2019   Chronic systolic heart failure 11/22/2015   Coronary artery disease    a. cath in 08/2017 showing mild nonobstructive CAD with scattered 20-30% stenosis.    Diabetic neuropathy 09/26/2017   Diabetic retinopathy    NPDR OU   Essential hypertension 11/22/2015   Foot pain, bilateral 02/01/2019   Gastroesophageal reflux disease 05/10/2017   Heart attack    While living in [place]   History of adenomatous colonic polyps 05/19/2011   05/2011 9 adenomas 08/2017 5 adenomas (dimin) Recall 2022   History of COVID-19 06/18/2019   HLD (hyperlipidemia) 11/20/2015   Hypoglycemia due  to insulin  10/15/2019   Insomnia 05/10/2017   Intermittent claudication 05/25/2020   Mild neurocognitive disorder due to Alzheimer's disease, possible 08/24/2021   Nonischemic cardiomyopathy    a. EF 20-25% by echo in 09/2017 with cath showing mild CAD. b.  Last echo 12/2017 EF 35-40%, grade 2 DD.   Obesity    Onychomycosis of multiple toenails with type 2 diabetes mellitus and peripheral neuropathy 05/10/2017   OSA on CPAP 05/10/2017   Right sided weakness 06/17/2019   Type II diabetes mellitus 11/20/2015   Unsteady gait 12/20/2018   Past Surgical History:  Procedure Laterality Date   BIOPSY  11/22/2021   Procedure: BIOPSY;  Surgeon: Abran Norleen SAILOR, MD;  Location: [facility];  Service: Gastroenterology;;   COLONOSCOPY  05/13/11, 08/2017   9 adenomas   COLONOSCOPY WITH PROPOFOL  N/A 11/22/2021   Procedure:  COLONOSCOPY WITH PROPOFOL ;  Surgeon: Abran Norleen SAILOR, MD;  Location: [facility];  Service: Gastroenterology;  Laterality: N/A;   ESOPHAGOGASTRODUODENOSCOPY (EGD) WITH PROPOFOL  N/A 11/22/2021   Procedure: ESOPHAGOGASTRODUODENOSCOPY (EGD) WITH PROPOFOL ;  Surgeon: Abran Norleen SAILOR, MD;  Location: [facility];  Service: Gastroenterology;  Laterality: N/A;   FOREARM SURGERY     MUSCLE BIOPSY     POLYPECTOMY     POLYPECTOMY  11/22/2021   Procedure: POLYPECTOMY;  Surgeon: Abran Norleen SAILOR, MD;  Location: [facility];  Service: Gastroenterology;;   RIGHT/LEFT HEART CATH AND CORONARY ANGIOGRAPHY N/A 08/25/2017   Procedure: RIGHT/LEFT HEART CATH AND CORONARY ANGIOGRAPHY;  Surgeon: Verlin Lonni BIRCH, MD;  Location: [facility];  Service: Cardiovascular;  Laterality: N/A;   RIGHT/LEFT HEART CATH AND CORONARY ANGIOGRAPHY N/A 10/01/2019   Procedure: RIGHT/LEFT HEART CATH AND CORONARY ANGIOGRAPHY;  Surgeon: Cherrie Toribio SAUNDERS, MD;  Location: [facility];  Service: Cardiovascular;  Laterality: N/A;   RIGHT/LEFT HEART CATH AND CORONARY ANGIOGRAPHY N/A 04/07/2021   Procedure: RIGHT/LEFT HEART  CATH AND CORONARY ANGIOGRAPHY;  Surgeon: Cherrie Toribio SAUNDERS, MD;  Location: [facility];  Service: Cardiovascular;  Laterality: N/A;   FAMILY HISTORY Family History  Problem Relation Age of Onset   Heart disease Mother    Heart attack Mother 66   Hypertension Mother    Hyperlipidemia Mother    Diabetes Mother    Heart disease Father    Heart attack Father 59   Hypertension Father    Hyperlipidemia Father    Diabetes Father    Heart attack Sister 67   Colon cancer Neg Hx    Stomach cancer Neg Hx    Esophageal cancer Neg Hx    Rectal cancer Neg Hx    Colon polyps Neg Hx    Lung disease Neg Hx    SOCIAL HISTORY Social History   Tobacco Use   Smoking status: Never   Smokeless tobacco: Never  Vaping Use   Vaping status: Never Used  Substance Use Topics   Alcohol use: No   Drug use: No       OPHTHALMIC EXAM:  Base Eye Exam     Visual Acuity (Snellen - Linear)       Right Left   Dist Boutte 20/350 20/40   Dist ph Equality 20/300 -2 20/40 +1         Tonometry (Tonopen, 8:11 AM)       Right Left   Pressure 20 16         Pupils       Dark Light Shape React APD   Right 3 2 Round Brisk None   Left 3 2 Round Brisk None         Visual Fields (Counting fingers)       Left Right    Full Full         Extraocular Movement       Right Left    Full, Ortho Full, Ortho         Neuro/Psych     Oriented x3: Yes   Mood/Affect: Normal         Dilation     Both eyes: 1.0% Mydriacyl, 2.5% Phenylephrine  @ 8:11 AM           Slit Lamp and Fundus Exam     Slit Lamp Exam       Right Left   Lids/Lashes Dermatochalasis - upper lid, Meibomian gland dysfunction Dermatochalasis - upper lid, Meibomian  gland dysfunction   Conjunctiva/Sclera nasal and temporal pinguecula, Melanosis nasal and temporal pinguecula, Melanosis   Cornea arcus arcus, 2+ inferior Punctate epithelial erosions   Anterior Chamber deep, clear, narrow temporal angle Deep and quiet    Iris Round and dilated, No NVI Round and dilated, No NVI   Lens 2-3+ Nuclear sclerosis, 2-3+ Cortical cataract 2-3+ Nuclear sclerosis, 2-3+ Cortical cataract   Anterior Vitreous Vitreous syneresis, mild Asteroid hyalosis Vitreous syneresis         Fundus Exam       Right Left   Disc Pink and Sharp, no NVD Pink and Sharp, no NVD, Compact   C/D Ratio 0.2 0.2   Macula Blunted foveal reflex, central edema - slightly increased, diffuse exudates greatest centrally, scattered MA/DBH Blunted foveal reflex, central edema with scattered exudates -- slightly increased, scattered MA/DBH   Vessels attenuated, Tortuous, ST venule dilated attenuated, Tortuous   Periphery Attached, scattered MA/DBH, exudates greatest posteriorly Attached, scattered MA/DBH and exudates greatest posteriorly           IMAGING AND PROCEDURES  Imaging and Procedures for 01/18/2024  OCT, Retina - OU - Both Eyes       Right Eye Quality was good. Central Foveal Thickness: 273. Progression has worsened. Findings include no SRF, abnormal foveal contour, subretinal hyper-reflective material, intraretinal hyper-reflective material, intraretinal fluid, vitreomacular adhesion (interval increase in central edema with IRF/IRHM/SRHM).   Left Eye Quality was good. Central Foveal Thickness: 228. Progression has worsened. Findings include no SRF, abnormal foveal contour, intraretinal hyper-reflective material, intraretinal fluid, subretinal fluid (Mild interval increase in IRF, mild interval improvement in [facility]).   Notes *Images captured and stored on drive  Diagnosis / Impression:  Central DME OU OD: interval increase in central edema with IRF/IRHM/SRHM OS: Mild interval increase in IRF, mild interval improvement in [facility]  Clinical management:  See below  Abbreviations: NFP - Normal foveal profile. CME - cystoid macular edema. PED - pigment epithelial detachment. IRF - intraretinal fluid. SRF - subretinal fluid. EZ -  ellipsoid zone. ERM - epiretinal membrane. ORA - outer retinal atrophy. ORT - outer retinal tubulation. SRHM - subretinal hyper-reflective material. IRHM - intraretinal hyper-reflective material      Intravitreal Injection, Pharmacologic Agent - OD - Right Eye       Time Out 01/18/2024. 8:57 AM. Confirmed correct patient, procedure, site, and patient consented.   Anesthesia Topical anesthesia was used. Anesthetic medications included Lidocaine  2%, Proparacaine 0.5%.   Procedure Preparation included 5% betadine to ocular surface, eyelid speculum. A supplied (32g) needle was used.   Injection: 1.25 mg Bevacizumab  1.25mg /0.42ml   Route: Intravitreal, Site: Right Eye   NDC: H525437, Lot: [id number], Expiration date: 02/19/2024   Post-op Post injection exam found visual acuity of at least counting fingers. The patient tolerated the procedure well. There were no complications. The patient received written and verbal post procedure care education. Post injection medications were not given.      Intravitreal Injection, Pharmacologic Agent - OS - Left Eye       Time Out 01/18/2024. 8:57 AM. Confirmed correct patient, procedure, site, and patient consented.   Anesthesia Topical anesthesia was used. Anesthetic medications included Lidocaine  2%, Proparacaine 0.5%.   Procedure Preparation included 5% betadine to ocular surface, eyelid speculum. A supplied (32g) needle was used.   Injection: 1.25 mg Bevacizumab  1.25mg /0.72ml   Route: Intravitreal, Site: Left Eye   NDC: [id number], Lot: [id number], Expiration date: 01/29/2024   Post-op Post injection exam found visual acuity of  at least counting fingers. The patient tolerated the procedure well. There were no complications. The patient received written and verbal post procedure care education. Post injection medications were not given.              ASSESSMENT/PLAN:    ICD-10-CM   1. Severe nonproliferative diabetic retinopathy of  both eyes with macular edema associated with type 2 diabetes mellitus (HCC)  E11.3413 OCT, Retina - OU - Both Eyes    Intravitreal Injection, Pharmacologic Agent - OD - Right Eye    Intravitreal Injection, Pharmacologic Agent - OS - Left Eye    Bevacizumab  (AVASTIN ) SOLN 1.25 mg    Bevacizumab  (AVASTIN ) SOLN 1.25 mg    2. Essential hypertension  I10     3. Hypertensive retinopathy of both eyes  H35.033     4. Combined forms of age-related cataract of both eyes  H25.813      1. Severe Non-proliferative diabetic retinopathy w/ DME, OU - delayed f/u - 9 wks instead of 6 (05.14.25 to 07.17.25)  - delayed f/u - 8 wks instead of 4 (3.17.25 to 05.14.25)  - pt delayed from 4 weeks to 6.5 weeks (01.29.25 to 03.17.25)  - pt delayed from 4 weeks to 12 weeks (11.06.24 to 01.29.25) due to being in the hospital multiple times  - pt delayed from 4 weeks to 5+ months (04.24.24 to 10.09.24) due to other medical procedures  - pt delayed from 4 weeks to 11 months (04.18.22 to 03.27.23) due to other health issues  - pt was delayed from 4 weeks to 9 weeks (12.14.21 to 2.21.22) due to financial / insurance reasons  - A1c 12.0 on 09.18.24  - s/p IVA OS #1 (09.16.21), IVA OD #1 (09.21.21)             - s/p IVA OU #2 (10.19.21), #3 (11.16.21), #4 (12.14.21), #5 (02.21.22), #6 (03.21.22), #7 (04.18.22), #8 (03.27.23), #9 (04.24.23), #10 (10.03.24), #11 (11.06.24), #12 (01.29.25), #13 (03.17.25), #14 (05.14.25) - exam shows central edema OU, scattered IRH and severe exudates OU - FA (09.16.21) shows late leaking MA greatest posterior pole; no NV; +peripheral vascular perfusion defects -- may benefit from peripheral PRP OU  - OCT shows OD: interval increase in central edema with IRF/IRHM/SRHM; OS: Mild interval increase in IRF, mild interval improvement in IRHM at 9 weeks  - BCVA OD stable at 20/300; OS decreased to 20/40 from 20/30- recommend IVA OU #14 today, 07.17.25 w/ f/u back to 5-6 wks - pt wishes to proceed  with injection - RBA of procedure discussed, questions answered - informed consent obtained and signed - see procedure note - Avastin  informed consent obtained, re-signed and scanned, 01.29.25 (OU) - f/u 5-6 weeks, DFE, OCT, possible injection(s)  2,3. Hypertensive retinopathy OU - discussed importance of tight BP control - monitor  4. Mixed Cataract OU - The symptoms of cataract, surgical options, and treatments and risks were discussed with patient. - discussed diagnosis and progression - now following with Dr. Fleeta  Ophthalmic Meds Ordered this visit:  Meds ordered this encounter  Medications   Bevacizumab  (AVASTIN ) SOLN 1.25 mg   Bevacizumab  (AVASTIN ) SOLN 1.25 mg     Return for f/u 5-6 weeks, NPDR OU, DFE, OCT, Possible Injxn.  There are no Patient Instructions on file for this visit.  This document serves as a record of services personally performed by Redell JUDITHANN Hans, MD, PhD. It was created on their behalf by Alan PARAS. Delores, OA an ophthalmic technician. The creation  of this record is the provider's dictation and/or activities during the visit.    Electronically signed by: Alan PARAS. Delores, OA 01/18/24 3:41 PM   Redell JUDITHANN Hans, M.D., Ph.D. Diseases & Surgery of the Retina and Vitreous [facility]  I have reviewed the above documentation for accuracy and completeness, and I agree with the above. Redell JUDITHANN Hans, M.D., Ph.D. 01/18/24 3:46 PM   Abbreviations: M myopia (nearsighted); A astigmatism; H hyperopia (farsighted); P presbyopia; Mrx spectacle prescription;  CTL contact lenses; OD right eye; OS left eye; OU both eyes  XT exotropia; ET esotropia; PEK punctate epithelial keratitis; PEE punctate epithelial erosions; DES dry eye syndrome; MGD meibomian gland dysfunction; ATs artificial tears; PFAT's preservative free artificial tears; NSC nuclear sclerotic cataract; PSC posterior subcapsular cataract; ERM epi-retinal membrane; PVD posterior  vitreous detachment; RD retinal detachment; DM diabetes mellitus; DR diabetic retinopathy; NPDR non-proliferative diabetic retinopathy; PDR proliferative diabetic retinopathy; CSME clinically significant macular edema; DME diabetic macular edema; dbh dot blot hemorrhages; CWS cotton wool spot; POAG primary open angle glaucoma; C/D cup-to-disc ratio; HVF humphrey visual field; GVF goldmann visual field; OCT optical coherence tomography; IOP intraocular pressure; BRVO Branch retinal vein occlusion; CRVO central retinal vein occlusion; CRAO central retinal artery occlusion; BRAO branch retinal artery occlusion; RT retinal tear; SB scleral buckle; PPV pars plana vitrectomy; VH Vitreous hemorrhage; PRP panretinal laser photocoagulation; IVK intravitreal kenalog ; VMT vitreomacular traction; MH Macular hole;  NVD neovascularization of the disc; NVE neovascularization elsewhere; AREDS age related eye disease study; ARMD age related macular degeneration; POAG primary open angle glaucoma; EBMD epithelial/anterior basement membrane dystrophy; ACIOL anterior chamber intraocular lens; IOL intraocular lens; PCIOL posterior chamber intraocular lens; Phaco/IOL phacoemulsification with intraocular lens placement; PRK photorefractive keratectomy; LASIK laser assisted in situ keratomileusis; HTN hypertension; DM diabetes mellitus; COPD chronic obstructive pulmonary disease

## 2024-01-18 ENCOUNTER — Encounter (INDEPENDENT_AMBULATORY_CARE_PROVIDER_SITE_OTHER): Payer: Self-pay | Admitting: Ophthalmology

## 2024-01-18 ENCOUNTER — Ambulatory Visit (INDEPENDENT_AMBULATORY_CARE_PROVIDER_SITE_OTHER): Admitting: Ophthalmology

## 2024-01-18 DIAGNOSIS — H25813 Combined forms of age-related cataract, bilateral: Secondary | ICD-10-CM | POA: Diagnosis not present

## 2024-01-18 DIAGNOSIS — H35033 Hypertensive retinopathy, bilateral: Secondary | ICD-10-CM | POA: Diagnosis not present

## 2024-01-18 DIAGNOSIS — I10 Essential (primary) hypertension: Secondary | ICD-10-CM | POA: Diagnosis not present

## 2024-01-18 DIAGNOSIS — E113413 Type 2 diabetes mellitus with severe nonproliferative diabetic retinopathy with macular edema, bilateral: Secondary | ICD-10-CM | POA: Diagnosis not present

## 2024-01-18 MED ORDER — BEVACIZUMAB CHEMO INJECTION 1.25MG/0.05ML SYRINGE FOR KALEIDOSCOPE
1.2500 mg | INTRAVITREAL | Status: AC | PRN
Start: 2024-01-18 — End: 2024-01-18
  Administered 2024-01-18: 1.25 mg via INTRAVITREAL

## 2024-01-18 MED ORDER — BEVACIZUMAB CHEMO INJECTION 1.25MG/0.05ML SYRINGE FOR KALEIDOSCOPE
1.2500 mg | INTRAVITREAL | Status: AC | PRN
  Administered 2024-01-18: 1.25 mg via INTRAVITREAL

## 2024-01-22 ENCOUNTER — Ambulatory Visit: Admitting: Cardiology

## 2024-02-08 NOTE — Progress Notes (Signed)
 [facility] - Clinic Note  02/22/2024     CHIEF COMPLAINT Patient presents for Retina Follow Up   HISTORY OF PRESENT ILLNESS: Calvin [name] is a 63 y.o. male who presents to the clinic today for:  HPI     Retina Follow Up   Patient presents with  Diabetic Retinopathy.  In both eyes.  This started 4 years ago.  Duration of 6 weeks.  Since onset it is stable.  I, the attending physician,  performed the HPI with the patient and updated documentation appropriately.        Comments   Patient states vision seems to be better. Pt denies FOL/floaters/pain. Pt does not use ats. BS=247 this morning A1c=pt does not remember but it was high.      Last edited by Valdemar Rogue, MD on 02/22/2024 11:04 AM.    Pt states he's doing well, vision seems a little better.    Referring physician: Fleeta Gentile MD [address], [address] [place], [place] [address]  HISTORICAL INFORMATION:   Selected notes from the MEDICAL RECORD NUMBER Referred by Dr. Gentile Fleeta LEE: 09/27/21 BCVA OD 20/80 OS 20/60-2 Ocular history: NPDR, Medical history: DM   CURRENT MEDICATIONS: No current outpatient medications on file. (Ophthalmic Drugs)   No current facility-administered medications for this visit. (Ophthalmic Drugs)   Current Outpatient Medications (Other)  Medication Sig   acetaminophen  (TYLENOL ) 325 MG tablet Take 2.5 tablets (812.5 mg total) by mouth 3 (three) times daily.   alum & mag hydroxide-simeth (MAALOX MAX) 400-400-40 MG/5ML suspension Take 15 mLs by mouth every 6 (six) hours as needed for indigestion.   amitriptyline  (ELAVIL ) 25 MG tablet Take 25 mg by mouth at bedtime.   amLODipine (NORVASC) 10 MG tablet Take 10 mg by mouth daily.   apixaban  (ELIQUIS ) 5 MG TABS tablet Take 1 tablet (5 mg total) by mouth 2 (two) times daily.   bisacodyl  (DULCOLAX) 5 MG EC tablet Take 2 tablets (10 mg total) by mouth every other day. Take at bedtime   Blood Glucose Monitoring  Suppl (ONETOUCH VERIO FLEX SYSTEM) w/Device KIT 1 each by Other route daily.   Blood Pressure KIT Use as directed to check blood pressure twice daily.   carvedilol  (COREG ) 6.25 MG tablet Take 1 tablet (6.25 mg total) by mouth 2 (two) times daily with a meal.   dapagliflozin  propanediol (FARXIGA ) 10 MG TABS tablet Take 1 tablet (10 mg total) by mouth every morning.   docusate sodium  (COLACE) 100 MG capsule Take 100 mg by mouth 2 (two) times daily.   donepezil  (ARICEPT ) 10 MG tablet TAKE ONE TABLET BY MOUTH EVERYDAY AT BEDTIME   Evolocumab  (REPATHA ) 140 MG/ML SOSY Inject 140 mg as directed every 14 (fourteen) days.   ezetimibe  (ZETIA ) 10 MG tablet Take 1 tablet (10 mg total) by mouth every morning.   glipiZIDE (GLUCOTROL) 5 MG tablet Take 5 mg by mouth daily.   glucose blood (ONETOUCH VERIO) test strip 1 each by Other route 2 (two) times daily. And lancets 2/day   insulin  degludec (TRESIBA  FLEXTOUCH) 200 UNIT/ML FlexTouch Pen Inject 54 Units into the skin daily. And pen needles 1/day   insulin  glargine (LANTUS ) 100 UNIT/ML injection Inject 36 Units into the skin at bedtime.   insulin  lispro (HUMALOG) 100 UNIT/ML injection Inject 16 Units into the skin 3 (three) times daily before meals.   Insulin  Syringe-Needle U-100 (INSULIN  SYRINGE .3CC/29GX1/2) 29G X 1/2 0.3 ML MISC Inject 1 Syringe 3 (three) times  daily as directed. Check blood sugar three times daily. Dx: E11.9   lactulose (CEPHULAC) 10 g packet Take 30 mLs by mouth as needed.   Lancets (ONETOUCH DELICA PLUS LANCET30G) MISC USE TWICE DAILY   losartan  (COZAAR ) 25 MG tablet Take 1 tablet (25 mg total) by mouth daily.   metFORMIN  (GLUCOPHAGE ) 1000 MG tablet Take 1 tablet (1,000 mg total) by mouth daily.   metoCLOPramide  (REGLAN ) 10 MG tablet Take 10 mg by mouth 2 (two) times daily.   nitroGLYCERIN  (NITROSTAT ) 0.4 MG SL tablet Place 1 tablet (0.4 mg total) under the tongue every 5 (five) minutes as needed for chest pain.   ondansetron   (ZOFRAN -ODT) 4 MG disintegrating tablet Take 1 tablet (4 mg total) by mouth every 8 (eight) hours as needed for nausea or vomiting.   pantoprazole  (PROTONIX ) 40 MG tablet TAKE ONE TABLET BY MOUTH EVERY MORNING and TAKE ONE TABLET BY MOUTH EVERYDAY AT BEDTIME   polyethylene glycol (MIRALAX  / GLYCOLAX ) 17 g packet Take 17 g by mouth 2 (two) times daily.   rosuvastatin  (CRESTOR ) 40 MG tablet Take 1 tablet (40 mg total) by mouth at bedtime.   sildenafil  (VIAGRA ) 100 MG tablet Take 1 tablet (100 mg total) by mouth daily as needed for erectile dysfunction.   spironolactone  (ALDACTONE ) 25 MG tablet Take 1 tablet (25 mg total) by mouth every morning.   triamcinolone  cream (KENALOG ) 0.1 % Apply 1 Application topically daily.   No current facility-administered medications for this visit. (Other)   REVIEW OF SYSTEMS: ROS   Positive for: Gastrointestinal, Neurological, Musculoskeletal, Endocrine, Cardiovascular, Eyes, Respiratory Negative for: Constitutional, Skin, Genitourinary, HENT, Psychiatric, Allergic/Imm, Heme/Lymph Last edited by Elnor Avelina RAMAN, COT on 02/22/2024  8:11 AM.     ALLERGIES No Known Allergies  PAST MEDICAL HISTORY Past Medical History:  Diagnosis Date   Adenoidal hypertrophy    Adrenal nodule 09/21/2020   AF (paroxysmal atrial fibrillation) 03/10/2018   AKI (acute kidney injury) 06/17/2019   Arthritis    Cataract    Mixed OU   Chronic anticoagulation 10/15/2019   Chronic systolic heart failure 11/22/2015   Coronary artery disease    a. cath in 08/2017 showing mild nonobstructive CAD with scattered 20-30% stenosis.    Diabetic neuropathy 09/26/2017   Diabetic retinopathy    NPDR OU   Essential hypertension 11/22/2015   Foot pain, bilateral 02/01/2019   Gastroesophageal reflux disease 05/10/2017   Heart attack    While living in [place]   History of adenomatous colonic polyps 05/19/2011   05/2011 9 adenomas 08/2017 5 adenomas (dimin) Recall 2022   History of COVID-19  06/18/2019   HLD (hyperlipidemia) 11/20/2015   Hypoglycemia due to insulin  10/15/2019   Insomnia 05/10/2017   Intermittent claudication 05/25/2020   Mild neurocognitive disorder due to Alzheimer's disease, possible 08/24/2021   Nonischemic cardiomyopathy    a. EF 20-25% by echo in 09/2017 with cath showing mild CAD. b.  Last echo 12/2017 EF 35-40%, grade 2 DD.   Obesity    Onychomycosis of multiple toenails with type 2 diabetes mellitus and peripheral neuropathy 05/10/2017   OSA on CPAP 05/10/2017   Right sided weakness 06/17/2019   Type II diabetes mellitus 11/20/2015   Unsteady gait 12/20/2018   Past Surgical History:  Procedure Laterality Date   BIOPSY  11/22/2021   Procedure: BIOPSY;  Surgeon: Abran Norleen SAILOR, MD;  Location: [facility];  Service: Gastroenterology;;   COLONOSCOPY  05/13/11, 08/2017   9 adenomas   COLONOSCOPY WITH PROPOFOL  N/A  11/22/2021   Procedure: COLONOSCOPY WITH PROPOFOL ;  Surgeon: Abran Norleen SAILOR, MD;  Location: [facility];  Service: Gastroenterology;  Laterality: N/A;   ESOPHAGOGASTRODUODENOSCOPY (EGD) WITH PROPOFOL  N/A 11/22/2021   Procedure: ESOPHAGOGASTRODUODENOSCOPY (EGD) WITH PROPOFOL ;  Surgeon: Abran Norleen SAILOR, MD;  Location: [facility];  Service: Gastroenterology;  Laterality: N/A;   FOREARM SURGERY     MUSCLE BIOPSY     POLYPECTOMY     POLYPECTOMY  11/22/2021   Procedure: POLYPECTOMY;  Surgeon: Abran Norleen SAILOR, MD;  Location: [facility];  Service: Gastroenterology;;   RIGHT/LEFT HEART CATH AND CORONARY ANGIOGRAPHY N/A 08/25/2017   Procedure: RIGHT/LEFT HEART CATH AND CORONARY ANGIOGRAPHY;  Surgeon: Verlin Lonni BIRCH, MD;  Location: [facility];  Service: Cardiovascular;  Laterality: N/A;   RIGHT/LEFT HEART CATH AND CORONARY ANGIOGRAPHY N/A 10/01/2019   Procedure: RIGHT/LEFT HEART CATH AND CORONARY ANGIOGRAPHY;  Surgeon: Cherrie Toribio SAUNDERS, MD;  Location: [facility];  Service: Cardiovascular;  Laterality: N/A;   RIGHT/LEFT HEART CATH AND  CORONARY ANGIOGRAPHY N/A 04/07/2021   Procedure: RIGHT/LEFT HEART CATH AND CORONARY ANGIOGRAPHY;  Surgeon: Cherrie Toribio SAUNDERS, MD;  Location: [facility];  Service: Cardiovascular;  Laterality: N/A;   FAMILY HISTORY Family History  Problem Relation Age of Onset   Heart disease Mother    Heart attack Mother 110   Hypertension Mother    Hyperlipidemia Mother    Diabetes Mother    Heart disease Father    Heart attack Father 37   Hypertension Father    Hyperlipidemia Father    Diabetes Father    Heart attack Sister 84   Colon cancer Neg Hx    Stomach cancer Neg Hx    Esophageal cancer Neg Hx    Rectal cancer Neg Hx    Colon polyps Neg Hx    Lung disease Neg Hx    SOCIAL HISTORY Social History   Tobacco Use   Smoking status: Never   Smokeless tobacco: Never  Vaping Use   Vaping status: Never Used  Substance Use Topics   Alcohol use: No   Drug use: No       OPHTHALMIC EXAM:  Base Eye Exam     Visual Acuity (Snellen - Linear)       Right Left   Dist [place] 20/250 +1 20/30 -1   Dist ph [place] NI NI         Tonometry (Tonopen, 8:08 AM)       Right Left   Pressure 19 17         Pupils       Pupils Dark Light Shape React APD   Right PERRL 3 2 Round Brisk None   Left PERRL 3 2 Round Brisk None         Visual Fields       Left Right    Full Full         Extraocular Movement       Right Left    Full, Ortho Full, Ortho         Neuro/Psych     Oriented x3: Yes   Mood/Affect: Normal         Dilation     Both eyes: 1.0% Mydriacyl, 2.5% Phenylephrine  @ 8:09 AM           Slit Lamp and Fundus Exam     Slit Lamp Exam       Right Left   Lids/Lashes Dermatochalasis - upper lid, Meibomian gland dysfunction  Dermatochalasis - upper lid, Meibomian gland dysfunction   Conjunctiva/Sclera nasal and temporal pinguecula, Melanosis nasal and temporal pinguecula, Melanosis   Cornea arcus arcus, 2+ inferior Punctate epithelial erosions   Anterior  Chamber deep, clear, narrow temporal angle Deep and quiet   Iris Round and dilated, No NVI Round and dilated, No NVI   Lens 2-3+ Nuclear sclerosis, 2-3+ Cortical cataract 2-3+ Nuclear sclerosis, 2-3+ Cortical cataract   Anterior Vitreous Vitreous syneresis, mild Asteroid hyalosis Vitreous syneresis         Fundus Exam       Right Left   Disc Pink and Sharp, no NVD Pink and Sharp, no NVD, Compact   C/D Ratio 0.2 0.2   Macula Blunted foveal reflex, central edema with diffuse exudates greatest centrally, scattered MA/DBH Blunted foveal reflex, central edema with scattered exudates, scattered MA/DBH   Vessels attenuated, Tortuous, ST venule dilated attenuated, Tortuous   Periphery Attached, scattered MA/DBH, exudates greatest posteriorly Attached, scattered MA/DBH and exudates greatest posteriorly           IMAGING AND PROCEDURES  Imaging and Procedures for 02/22/2024  OCT, Retina - OU - Both Eyes       Right Eye Quality was good. Central Foveal Thickness: 289. Progression has worsened. Findings include no SRF, abnormal foveal contour, subretinal hyper-reflective material, intraretinal hyper-reflective material, intraretinal fluid, vitreomacular adhesion (Persistent central edema with IRF/IRHM/SRHM-slightly increased).   Left Eye Quality was good. Central Foveal Thickness: 228. Progression has improved. Findings include no SRF, abnormal foveal contour, intraretinal hyper-reflective material, intraretinal fluid, subretinal fluid (Persistent IRF/IRHM--slightly improved).   Notes *Images captured and stored on drive  Diagnosis / Impression:  Central DME OU OD: Persistent central edema with IRF/IRHM/SRHM-slightly increased OS: Persistent IRF/IRHM--slightly improved  Clinical management:  See below  Abbreviations: NFP - Normal foveal profile. CME - cystoid macular edema. PED - pigment epithelial detachment. IRF - intraretinal fluid. SRF - subretinal fluid. EZ - ellipsoid zone. ERM  - epiretinal membrane. ORA - outer retinal atrophy. ORT - outer retinal tubulation. SRHM - subretinal hyper-reflective material. IRHM - intraretinal hyper-reflective material      Intravitreal Injection, Pharmacologic Agent - OD - Right Eye       Time Out 02/22/2024. 9:14 AM. Confirmed correct patient, procedure, site, and patient consented.   Anesthesia Topical anesthesia was used. Anesthetic medications included Lidocaine  2%, Proparacaine 0.5%.   Procedure Preparation included 5% betadine to ocular surface, eyelid speculum. A (32g) needle was used.   Injection: 1.25 mg Bevacizumab  1.25mg /0.52ml   Route: Intravitreal, Site: Right Eye   NDC: H525437, Lot: [id number], Expiration date: 04/04/2024   Post-op Post injection exam found visual acuity of at least counting fingers. The patient tolerated the procedure well. There were no complications. The patient received written and verbal post procedure care education. Post injection medications were not given.      Intravitreal Injection, Pharmacologic Agent - OS - Left Eye       Time Out 02/22/2024. 9:14 AM. Confirmed correct patient, procedure, site, and patient consented.   Anesthesia Topical anesthesia was used. Anesthetic medications included Lidocaine  2%, Proparacaine 0.5%.   Procedure Preparation included 5% betadine to ocular surface, eyelid speculum. A (32g) needle was used.   Injection: 1.25 mg Bevacizumab  1.25mg /0.65ml   Route: Intravitreal, Site: Left Eye   NDC: H525437, Lot: [id number], Expiration date: 05/30/2024   Post-op Post injection exam found visual acuity of at least counting fingers. The patient tolerated the procedure well. There were no complications. The patient received written  and verbal post procedure care education. Post injection medications were not given.            ASSESSMENT/PLAN:    ICD-10-CM   1. Severe nonproliferative diabetic retinopathy of both eyes with macular edema  associated with type 2 diabetes mellitus (HCC)  E11.3413 OCT, Retina - OU - Both Eyes    Intravitreal Injection, Pharmacologic Agent - OD - Right Eye    Intravitreal Injection, Pharmacologic Agent - OS - Left Eye    Bevacizumab  (AVASTIN ) SOLN 1.25 mg    Bevacizumab  (AVASTIN ) SOLN 1.25 mg    2. Essential hypertension  I10     3. Hypertensive retinopathy of both eyes  H35.033     4. Combined forms of age-related cataract of both eyes  H25.813      1. Severe Non-proliferative diabetic retinopathy w/ DME, OU - delayed f/u - 9 wks instead of 6 (05.14.25 to 07.17.25)  - delayed f/u - 8 wks instead of 4 (3.17.25 to 05.14.25)  - pt delayed from 4 weeks to 6.5 weeks (01.29.25 to 03.17.25)  - pt delayed from 4 weeks to 12 weeks (11.06.24 to 01.29.25) due to being in the hospital multiple times  - pt delayed from 4 weeks to 5+ months (04.24.24 to 10.09.24) due to other medical procedures  - pt delayed from 4 weeks to 11 months (04.18.22 to 03.27.23) due to other health issues  - pt was delayed from 4 weeks to 9 weeks (12.14.21 to 2.21.22) due to financial / insurance reasons  - A1c 12.0 on 09.18.24  - s/p IVA OS #1 (09.16.21), IVA OD #1 (09.21.21)             - s/p IVA OU #2 (10.19.21), #3 (11.16.21), #4 (12.14.21), #5 (02.21.22), #6 (03.21.22), #7 (04.18.22), #8 (03.27.23), #9 (04.24.23), #10 (10.03.24), #11 (11.06.24), #12 (01.29.25), #13 (03.17.25), #14 (05.14.25),  #15 (07.17.25)  - exam shows central edema OU, scattered IRH and severe exudates OU - FA (09.16.21) shows late leaking MA greatest posterior pole; no NV; +peripheral vascular perfusion defects -- may benefit from peripheral PRP OU  - OCT shows OD: Persistent central edema with IRF/IRHM/SRHM-slightly increased; OS: Persistent IRF/IRHM--slightly improved at 5 weeks  - BCVA OD 20/250 improved from 20/300; OS: 20/30 improved from 20/40 - recommend IVA today OU #16 (08.21.25) w/ f/u 5 wks - pt wishes to proceed with injection - RBA of  procedure discussed, questions answered - informed consent obtained and signed - see procedure note - Avastin  informed consent obtained, re-signed and scanned, 01.29.25 (OU) - Pt approved for Eylea but would owe 20%.  - f/u 5 weeks, DFE, OCT, possible injection(s)  2,3. Hypertensive retinopathy OU - discussed importance of tight BP control - monitor  4. Mixed Cataract OU - The symptoms of cataract, surgical options, and treatments and risks were discussed with patient. - discussed diagnosis and progression - now following with Dr. Fleeta  Ophthalmic Meds Ordered this visit:  Meds ordered this encounter  Medications   Bevacizumab  (AVASTIN ) SOLN 1.25 mg   Bevacizumab  (AVASTIN ) SOLN 1.25 mg     Return in about 5 weeks (around 03/28/2024) for NPDR OU, DFE, OCT, Possible Injxn.  There are no Patient Instructions on file for this visit.  This document serves as a record of services personally performed by Redell JUDITHANN Hans, MD, PhD. It was created on their behalf by Avelina Pereyra, COA an ophthalmic technician. The creation of this record is the provider's dictation and/or activities during the visit.  Electronically signed by: Avelina GORMAN Pereyra, COT  02/23/24  12:33 AM   This document serves as a record of services personally performed by Redell JUDITHANN Hans, MD, PhD. It was created on their behalf by Almetta Pesa, an ophthalmic technician. The creation of this record is the provider's dictation and/or activities during the visit.    Electronically signed by: Almetta Pesa, OA, 02/23/24  12:33 AM  Redell JUDITHANN Hans, M.D., Ph.D. Diseases & Surgery of the Retina and Vitreous [facility]  I have reviewed the above documentation for accuracy and completeness, and I agree with the above. Redell JUDITHANN Hans, M.D., Ph.D. 02/23/24 12:36 AM   Abbreviations: M myopia (nearsighted); A astigmatism; H hyperopia (farsighted); P presbyopia; Mrx spectacle prescription;  CTL contact  lenses; OD right eye; OS left eye; OU both eyes  XT exotropia; ET esotropia; PEK punctate epithelial keratitis; PEE punctate epithelial erosions; DES dry eye syndrome; MGD meibomian gland dysfunction; ATs artificial tears; PFAT's preservative free artificial tears; NSC nuclear sclerotic cataract; PSC posterior subcapsular cataract; ERM epi-retinal membrane; PVD posterior vitreous detachment; RD retinal detachment; DM diabetes mellitus; DR diabetic retinopathy; NPDR non-proliferative diabetic retinopathy; PDR proliferative diabetic retinopathy; CSME clinically significant macular edema; DME diabetic macular edema; dbh dot blot hemorrhages; CWS cotton wool spot; POAG primary open angle glaucoma; C/D cup-to-disc ratio; HVF humphrey visual field; GVF goldmann visual field; OCT optical coherence tomography; IOP intraocular pressure; BRVO Branch retinal vein occlusion; CRVO central retinal vein occlusion; CRAO central retinal artery occlusion; BRAO branch retinal artery occlusion; RT retinal tear; SB scleral buckle; PPV pars plana vitrectomy; VH Vitreous hemorrhage; PRP panretinal laser photocoagulation; IVK intravitreal kenalog ; VMT vitreomacular traction; MH Macular hole;  NVD neovascularization of the disc; NVE neovascularization elsewhere; AREDS age related eye disease study; ARMD age related macular degeneration; POAG primary open angle glaucoma; EBMD epithelial/anterior basement membrane dystrophy; ACIOL anterior chamber intraocular lens; IOL intraocular lens; PCIOL posterior chamber intraocular lens; Phaco/IOL phacoemulsification with intraocular lens placement; PRK photorefractive keratectomy; LASIK laser assisted in situ keratomileusis; HTN hypertension; DM diabetes mellitus; COPD chronic obstructive pulmonary disease

## 2024-02-22 ENCOUNTER — Encounter (INDEPENDENT_AMBULATORY_CARE_PROVIDER_SITE_OTHER): Payer: Self-pay | Admitting: Ophthalmology

## 2024-02-22 ENCOUNTER — Ambulatory Visit (INDEPENDENT_AMBULATORY_CARE_PROVIDER_SITE_OTHER): Admitting: Ophthalmology

## 2024-02-22 DIAGNOSIS — E113413 Type 2 diabetes mellitus with severe nonproliferative diabetic retinopathy with macular edema, bilateral: Secondary | ICD-10-CM | POA: Diagnosis not present

## 2024-02-22 DIAGNOSIS — I10 Essential (primary) hypertension: Secondary | ICD-10-CM | POA: Diagnosis not present

## 2024-02-22 DIAGNOSIS — H35033 Hypertensive retinopathy, bilateral: Secondary | ICD-10-CM

## 2024-02-22 DIAGNOSIS — H25813 Combined forms of age-related cataract, bilateral: Secondary | ICD-10-CM | POA: Diagnosis not present

## 2024-02-22 MED ORDER — BEVACIZUMAB CHEMO INJECTION 1.25MG/0.05ML SYRINGE FOR KALEIDOSCOPE
1.2500 mg | INTRAVITREAL | Status: AC | PRN
Start: 2024-02-22 — End: 2024-02-22
  Administered 2024-02-22: 1.25 mg via INTRAVITREAL

## 2024-02-22 MED ORDER — BEVACIZUMAB CHEMO INJECTION 1.25MG/0.05ML SYRINGE FOR KALEIDOSCOPE
1.2500 mg | INTRAVITREAL | Status: AC | PRN
  Administered 2024-02-22: 1.25 mg via INTRAVITREAL

## 2024-03-14 ENCOUNTER — Encounter: Payer: Self-pay | Admitting: Podiatry

## 2024-03-14 ENCOUNTER — Ambulatory Visit (INDEPENDENT_AMBULATORY_CARE_PROVIDER_SITE_OTHER): Admitting: Podiatry

## 2024-03-14 VITALS — Ht 66.0 in | Wt 161.0 lb

## 2024-03-14 DIAGNOSIS — M79675 Pain in left toe(s): Secondary | ICD-10-CM

## 2024-03-14 DIAGNOSIS — B351 Tinea unguium: Secondary | ICD-10-CM

## 2024-03-14 DIAGNOSIS — E1149 Type 2 diabetes mellitus with other diabetic neurological complication: Secondary | ICD-10-CM

## 2024-03-14 DIAGNOSIS — M79674 Pain in right toe(s): Secondary | ICD-10-CM | POA: Diagnosis not present

## 2024-03-14 NOTE — Progress Notes (Signed)
 This patient returns to my office for at risk foot care.  This patient requires this care by a professional since this patient will be at risk due to having CKD, diabetic neuropathy and coagulation defect due to eliquis . This patient is unable to cut nails himself since the patient cannot reach his nails.These nails are painful walking and wearing shoes.  This patient presents for at risk foot care today.  General Appearance  Alert, conversant and in no acute stress.  Vascular  Dorsalis pedis and posterior tibial  pulses are palpable  bilaterally.  Capillary return is within normal limits  bilaterally. Temperature is within normal limits  bilaterally.  Neurologic  Senn-Weinstein monofilament wire test within normal limits  bilaterally. Muscle power within normal limits bilaterally.  Nails Thick disfigured discolored nails with subungual debris  from hallux to fifth toes bilaterally. No evidence of bacterial infection or drainage bilaterally.  Orthopedic  No limitations of motion  feet .  No crepitus or effusions noted.  No bony pathology or digital deformities noted.  Skin  normotropic skin with no porokeratosis noted bilaterally.  No signs of infections or ulcers noted.     Onychomycosis  Pain in right toes  Pain in left toes  Consent was obtained for treatment procedures.   Mechanical debridement of nails 1-5  bilaterally performed with a nail nipper.  Filed with dremel without incident.    Return office visit   3   months                  Told patient to return for periodic foot care and evaluation due to potential at risk complications.   Cordella Bold DPM

## 2024-03-22 ENCOUNTER — Encounter (HOSPITAL_COMMUNITY): Payer: Self-pay

## 2024-03-22 ENCOUNTER — Other Ambulatory Visit: Payer: Self-pay

## 2024-03-22 ENCOUNTER — Emergency Department (HOSPITAL_COMMUNITY)
Admission: EM | Admit: 2024-03-22 | Discharge: 2024-03-22 | Disposition: A | Discharge status: Home / Self Care | Attending: Emergency Medicine | Admitting: Emergency Medicine

## 2024-03-22 DIAGNOSIS — H9203 Otalgia, bilateral: Secondary | ICD-10-CM

## 2024-03-22 DIAGNOSIS — J309 Allergic rhinitis, unspecified: Secondary | ICD-10-CM | POA: Diagnosis not present

## 2024-03-22 DIAGNOSIS — Z7901 Long term (current) use of anticoagulants: Secondary | ICD-10-CM | POA: Diagnosis not present

## 2024-03-22 DIAGNOSIS — Z794 Long term (current) use of insulin: Secondary | ICD-10-CM | POA: Insufficient documentation

## 2024-03-22 DIAGNOSIS — J3489 Other specified disorders of nose and nasal sinuses: Secondary | ICD-10-CM | POA: Diagnosis present

## 2024-03-22 NOTE — ED Triage Notes (Signed)
 Pt states he feels like something is crawling in both ears. Denies recent sinus congestion.

## 2024-03-22 NOTE — Discharge Instructions (Signed)
 You were seen in the emergency department for itching sensation both ears Your ears look clear This is most likely due to seasonal allergies As discussed you should take a second-generation antihistamine such as Claritin , Zyrtec  or Allegra to help with your allergy symptoms Follow-up with your primary care doctor within 1 week for reevaluation Return to the Emergency Department for trouble breathing or any other concerns

## 2024-03-22 NOTE — ED Notes (Signed)
 Pt a&ox4 and brought himself to the ER and is returning home. Pt discharge instructions explained and pt had no questions. Charge nurse made aware.

## 2024-03-22 NOTE — ED Provider Notes (Signed)
 [place] EMERGENCY DEPARTMENT AT [facility] Provider Note   CSN: [id number] Arrival date & time: 03/22/24  9270     Patient presents with: Ear Problem   Calvin [name] is a 63 y.o. male.  With a history of allergic rhinitis who presents to the ED for ear discomfort.  Patient has been dealing with itchy nose rhinorrhea and itching in both eyes for last couple days.  He feels as though something is crawling in both of his ears.  Uses a nasal spray.  No other antihistamines.  No fevers chills shortness of breath.   HPI     Prior to Admission medications   Medication Sig Start Date End Date Taking? Authorizing Provider  acetaminophen  (TYLENOL ) 325 MG tablet Take 2.5 tablets (812.5 mg total) by mouth 3 (three) times daily. 11/29/23   O'NealDarryle Ned, MD  alum & mag hydroxide-simeth (MAALOX MAX) 400-400-40 MG/5ML suspension Take 15 mLs by mouth every 6 (six) hours as needed for indigestion. 08/20/22   Francesca Elsie CROME, MD  amitriptyline  (ELAVIL ) 25 MG tablet Take 25 mg by mouth at bedtime. 08/16/21   [provider]  amLODipine (NORVASC) 10 MG tablet Take 10 mg by mouth daily.    [provider]  apixaban  (ELIQUIS ) 5 MG TABS tablet Take 1 tablet (5 mg total) by mouth 2 (two) times daily. 08/21/23   Milford, Harlene HERO, FNP  bisacodyl  (DULCOLAX) 5 MG EC tablet Take 2 tablets (10 mg total) by mouth every other day. Take at bedtime 12/04/20   Avram Lupita BRAVO, MD  Blood Glucose Monitoring Suppl (ONETOUCH VERIO FLEX SYSTEM) w/Device KIT 1 each by Other route daily. 10/28/21   [provider]  Blood Pressure KIT Use as directed to check blood pressure twice daily. 06/29/22   Wyn Jackee VEAR Mickey., NP  carvedilol  (COREG ) 6.25 MG tablet Take 1 tablet (6.25 mg total) by mouth 2 (two) times daily with a meal. 08/21/23   Milford, Harlene HERO, FNP  dapagliflozin  propanediol (FARXIGA ) 10 MG TABS tablet Take 1 tablet (10 mg total) by mouth every morning. 01/11/24    Darryle Thom CROME, [name]  docusate sodium  (COLACE) 100 MG capsule Take 100 mg by mouth 2 (two) times daily.    [provider]  donepezil  (ARICEPT ) 10 MG tablet TAKE ONE TABLET BY MOUTH EVERYDAY AT BEDTIME 03/23/22   Wertman, Sara E, [name]  Evolocumab  (REPATHA ) 140 MG/ML SOSY Inject 140 mg as directed every 14 (fourteen) days. 01/11/24   Darryle Thom CROME, [name]  ezetimibe  (ZETIA ) 10 MG tablet Take 1 tablet (10 mg total) by mouth every morning. 08/21/23   Milford, Harlene HERO, FNP  glipiZIDE (GLUCOTROL) 5 MG tablet Take 5 mg by mouth daily. 12/18/23   [provider]  glucose blood (ONETOUCH VERIO) test strip 1 each by Other route 2 (two) times daily. And lancets 2/day 09/15/21   Kassie Mallick, MD  insulin  degludec (TRESIBA  FLEXTOUCH) 200 UNIT/ML FlexTouch Pen Inject 54 Units into the skin daily. And pen needles 1/day 09/15/21   Kassie Mallick, MD  insulin  glargine (LANTUS ) 100 UNIT/ML injection Inject 36 Units into the skin at bedtime.    [provider]  insulin  lispro (HUMALOG) 100 UNIT/ML injection Inject 16 Units into the skin 3 (three) times daily before meals.    [provider]  Insulin  Syringe-Needle U-100 (INSULIN  SYRINGE .3CC/29GX1/2) 29G X 1/2 0.3 ML MISC Inject 1 Syringe 3 (three) times daily as directed. Check blood sugar three times daily. Dx: E11.9  05/10/17   Franchot Hough, DO  lactulose (CEPHULAC) 10 g packet Take 30 mLs by mouth as needed.    [provider]  Lancets [facility] DELICA PLUS LANCET30G) MISC USE TWICE DAILY 10/30/19   Kassie Mallick, MD  losartan  (COZAAR ) 25 MG tablet Take 1 tablet (25 mg total) by mouth daily. 08/21/23   Milford, Harlene HERO, FNP  metFORMIN  (GLUCOPHAGE ) 1000 MG tablet Take 1 tablet (1,000 mg total) by mouth daily. 10/09/23   Kingsley, Victoria K, DO  metoCLOPramide  (REGLAN ) 10 MG tablet Take 10 mg by mouth 2 (two) times daily.    [provider]  nitroGLYCERIN  (NITROSTAT ) 0.4 MG SL tablet Place 1 tablet (0.4 mg total)  under the tongue every 5 (five) minutes as needed for chest pain. 10/27/17   Franchot Hough, DO  ondansetron  (ZOFRAN -ODT) 4 MG disintegrating tablet Take 1 tablet (4 mg total) by mouth every 8 (eight) hours as needed for nausea or vomiting. 09/05/21   Patsey Lot, MD  pantoprazole  (PROTONIX ) 40 MG tablet TAKE ONE TABLET BY MOUTH EVERY MORNING and TAKE ONE TABLET BY MOUTH EVERYDAY AT BEDTIME 07/13/22   Desai, Nikita S, MD  polyethylene glycol (MIRALAX  / GLYCOLAX ) 17 g packet Take 17 g by mouth 2 (two) times daily. 10/06/20   Avram Lupita BRAVO, MD  rosuvastatin  (CRESTOR ) 40 MG tablet Take 1 tablet (40 mg total) by mouth at bedtime. 08/21/23   Milford, Harlene HERO, FNP  sildenafil  (VIAGRA ) 100 MG tablet Take 1 tablet (100 mg total) by mouth daily as needed for erectile dysfunction. 09/15/21   Kassie Mallick, MD  spironolactone  (ALDACTONE ) 25 MG tablet Take 1 tablet (25 mg total) by mouth every morning. 08/21/23   Glena Harlene HERO, FNP  triamcinolone  cream (KENALOG ) 0.1 % Apply 1 Application topically daily.    [provider]    Allergies: Patient has no known allergies.    Review of Systems  Updated Vital Signs BP (!) 144/86   Pulse 88   Temp 98.7 F (37.1 C)   Resp 16   Ht 5' 4 (1.626 m)   Wt 74.8 kg   SpO2 100%   BMI 28.32 kg/m   Physical Exam Vitals and nursing note reviewed.  HENT:     Head: Normocephalic and atraumatic.     Right Ear: Tympanic membrane, ear canal and external ear normal. There is no impacted cerumen.     Left Ear: Tympanic membrane, ear canal and external ear normal. There is no impacted cerumen.     Nose: Congestion present.  Eyes:     Pupils: Pupils are equal, round, and reactive to light.  Cardiovascular:     Rate and Rhythm: Normal rate and regular rhythm.  Pulmonary:     Effort: Pulmonary effort is normal.     Breath sounds: Normal breath sounds.  Abdominal:     Palpations: Abdomen is soft.     Tenderness: There is no abdominal tenderness.   Skin:    General: Skin is warm and dry.  Neurological:     Mental Status: He is alert.  Psychiatric:        Mood and Affect: Mood normal.     (all labs ordered are listed, but only abnormal results are displayed) Labs Reviewed - No data to display  EKG: None  Radiology: No results found.   Procedures   Medications Ordered in the ED - No data to display  Medical Decision Making 63 year old male with history as above presenting for crawling sensation in both ears.  Also has concurrent rhinorrhea and itchy eyes itchy nose.  Most likely seasonal allergies.  Ears look clear.  Afebrile and well-appearing otherwise.  Counseled him on symptomatic management with over-the-counter second-generation antihistamine        Final diagnoses:  Allergic rhinitis, unspecified seasonality, unspecified trigger  Ear discomfort, bilateral    ED Discharge Orders     None          Pamella Ozell LABOR, DO 03/22/24 [id number]

## 2024-03-22 NOTE — ED Triage Notes (Signed)
 States for the last 2 days feels like something is crawling in both ears.

## 2024-03-25 NOTE — Progress Notes (Addendum)
 [facility] - Clinic Note  03/28/2024     CHIEF COMPLAINT Patient presents for Retina Follow Up   HISTORY OF PRESENT ILLNESS: Calvin [name] is a 63 y.o. male who presents to the clinic today for:  HPI     Retina Follow Up   Patient presents with  Diabetic Retinopathy.  In both eyes.  This started 5 weeks ago.  I, the attending physician,  performed the HPI with the patient and updated documentation appropriately.        Comments   Patient here for 5 weeks retina follow up for NPDR OU. Patient states vision about the same. No eye pain.       Last edited by Valdemar Rogue, MD on 03/28/2024  4:18 PM.    Pt states VA is the same, no changes. BS levels are around 340, reports compliant w/ meds. Increased Trulicity.     Referring physician: Fleeta Gentile MD [address], [address] [place], [place] [address]  HISTORICAL INFORMATION:   Selected notes from the MEDICAL RECORD NUMBER Referred by Dr. Gentile Fleeta LEE: 09/27/21 BCVA OD 20/80 OS 20/60-2 Ocular history: NPDR, Medical history: DM   CURRENT MEDICATIONS: No current outpatient medications on file. (Ophthalmic Drugs)   No current facility-administered medications for this visit. (Ophthalmic Drugs)   Current Outpatient Medications (Other)  Medication Sig   acetaminophen  (TYLENOL ) 325 MG tablet Take 2.5 tablets (812.5 mg total) by mouth 3 (three) times daily.   alum & mag hydroxide-simeth (MAALOX MAX) 400-400-40 MG/5ML suspension Take 15 mLs by mouth every 6 (six) hours as needed for indigestion.   amitriptyline  (ELAVIL ) 25 MG tablet Take 25 mg by mouth at bedtime.   amLODipine (NORVASC) 10 MG tablet Take 10 mg by mouth daily.   apixaban  (ELIQUIS ) 5 MG TABS tablet Take 1 tablet (5 mg total) by mouth 2 (two) times daily.   bisacodyl  (DULCOLAX) 5 MG EC tablet Take 2 tablets (10 mg total) by mouth every other day. Take at bedtime   Blood Glucose Monitoring Suppl (ONETOUCH VERIO FLEX SYSTEM)  w/Device KIT 1 each by Other route daily.   Blood Pressure KIT Use as directed to check blood pressure twice daily.   carvedilol  (COREG ) 6.25 MG tablet Take 1 tablet (6.25 mg total) by mouth 2 (two) times daily with a meal.   dapagliflozin  propanediol (FARXIGA ) 10 MG TABS tablet Take 1 tablet (10 mg total) by mouth every morning.   docusate sodium  (COLACE) 100 MG capsule Take 100 mg by mouth 2 (two) times daily.   donepezil  (ARICEPT ) 10 MG tablet TAKE ONE TABLET BY MOUTH EVERYDAY AT BEDTIME   Evolocumab  (REPATHA ) 140 MG/ML SOSY Inject 140 mg as directed every 14 (fourteen) days.   ezetimibe  (ZETIA ) 10 MG tablet Take 1 tablet (10 mg total) by mouth every morning.   glipiZIDE (GLUCOTROL) 5 MG tablet Take 5 mg by mouth daily.   glucose blood (ONETOUCH VERIO) test strip 1 each by Other route 2 (two) times daily. And lancets 2/day   insulin  degludec (TRESIBA  FLEXTOUCH) 200 UNIT/ML FlexTouch Pen Inject 54 Units into the skin daily. And pen needles 1/day   insulin  glargine (LANTUS ) 100 UNIT/ML injection Inject 36 Units into the skin at bedtime.   insulin  lispro (HUMALOG) 100 UNIT/ML injection Inject 16 Units into the skin 3 (three) times daily before meals.   Insulin  Syringe-Needle U-100 (INSULIN  SYRINGE .3CC/29GX1/2) 29G X 1/2 0.3 ML MISC Inject 1 Syringe 3 (three) times daily as directed. Check blood  sugar three times daily. Dx: E11.9   lactulose (CEPHULAC) 10 g packet Take 30 mLs by mouth as needed.   Lancets (ONETOUCH DELICA PLUS LANCET30G) MISC USE TWICE DAILY   losartan  (COZAAR ) 25 MG tablet Take 1 tablet (25 mg total) by mouth daily.   metFORMIN  (GLUCOPHAGE ) 1000 MG tablet Take 1 tablet (1,000 mg total) by mouth daily.   metoCLOPramide  (REGLAN ) 10 MG tablet Take 10 mg by mouth 2 (two) times daily.   nitroGLYCERIN  (NITROSTAT ) 0.4 MG SL tablet Place 1 tablet (0.4 mg total) under the tongue every 5 (five) minutes as needed for chest pain.   ondansetron  (ZOFRAN -ODT) 4 MG disintegrating tablet Take  1 tablet (4 mg total) by mouth every 8 (eight) hours as needed for nausea or vomiting.   pantoprazole  (PROTONIX ) 40 MG tablet TAKE ONE TABLET BY MOUTH EVERY MORNING and TAKE ONE TABLET BY MOUTH EVERYDAY AT BEDTIME   polyethylene glycol (MIRALAX  / GLYCOLAX ) 17 g packet Take 17 g by mouth 2 (two) times daily.   rosuvastatin  (CRESTOR ) 40 MG tablet Take 1 tablet (40 mg total) by mouth at bedtime.   sildenafil  (VIAGRA ) 100 MG tablet Take 1 tablet (100 mg total) by mouth daily as needed for erectile dysfunction.   spironolactone  (ALDACTONE ) 25 MG tablet Take 1 tablet (25 mg total) by mouth every morning.   triamcinolone  cream (KENALOG ) 0.1 % Apply 1 Application topically daily.   No current facility-administered medications for this visit. (Other)   REVIEW OF SYSTEMS: ROS   Positive for: Gastrointestinal, Neurological, Musculoskeletal, Endocrine, Cardiovascular, Eyes, Respiratory Negative for: Constitutional, Skin, Genitourinary, HENT, Psychiatric, Allergic/Imm, Heme/Lymph Last edited by Orval Asberry RAMAN, COA on 03/28/2024  8:42 AM.     ALLERGIES No Known Allergies  PAST MEDICAL HISTORY Past Medical History:  Diagnosis Date   Adenoidal hypertrophy    Adrenal nodule 09/21/2020   AF (paroxysmal atrial fibrillation) 03/10/2018   AKI (acute kidney injury) 06/17/2019   Arthritis    Cataract    Mixed OU   Chronic anticoagulation 10/15/2019   Chronic systolic heart failure 11/22/2015   Coronary artery disease    a. cath in 08/2017 showing mild nonobstructive CAD with scattered 20-30% stenosis.    Diabetic neuropathy 09/26/2017   Diabetic retinopathy    NPDR OU   Essential hypertension 11/22/2015   Foot pain, bilateral 02/01/2019   Gastroesophageal reflux disease 05/10/2017   Heart attack    [place]   History of adenomatous colonic polyps 05/19/2011   05/2011 9 adenomas 08/2017 5 adenomas (dimin) Recall 2022   History of COVID-19 06/18/2019   HLD (hyperlipidemia) 11/20/2015    Hypoglycemia due to insulin  10/15/2019   Insomnia 05/10/2017   Intermittent claudication 05/25/2020   Mild neurocognitive disorder due to Alzheimer's disease, possible 08/24/2021   Nonischemic cardiomyopathy    a. EF 20-25% by echo in 09/2017 with cath showing mild CAD. b.  Last echo 12/2017 EF 35-40%, grade 2 DD.   Obesity    Onychomycosis of multiple toenails with type 2 diabetes mellitus and peripheral neuropathy 05/10/2017   OSA on CPAP 05/10/2017   Right sided weakness 06/17/2019   Type II diabetes mellitus 11/20/2015   Unsteady gait 12/20/2018   Past Surgical History:  Procedure Laterality Date   BIOPSY  11/22/2021   Procedure: BIOPSY;  Surgeon: Abran Norleen SAILOR, MD;  Location: [facility];  Service: Gastroenterology;;   COLONOSCOPY  05/13/11, 08/2017   9 adenomas   COLONOSCOPY WITH PROPOFOL  N/A 11/22/2021   Procedure: COLONOSCOPY  WITH PROPOFOL ;  Surgeon: Abran Norleen SAILOR, MD;  Location: [facility];  Service: Gastroenterology;  Laterality: N/A;   ESOPHAGOGASTRODUODENOSCOPY (EGD) WITH PROPOFOL  N/A 11/22/2021   Procedure: ESOPHAGOGASTRODUODENOSCOPY (EGD) WITH PROPOFOL ;  Surgeon: Abran Norleen SAILOR, MD;  Location: [facility];  Service: Gastroenterology;  Laterality: N/A;   FOREARM SURGERY     MUSCLE BIOPSY     POLYPECTOMY     POLYPECTOMY  11/22/2021   Procedure: POLYPECTOMY;  Surgeon: Abran Norleen SAILOR, MD;  Location: [facility];  Service: Gastroenterology;;   RIGHT/LEFT HEART CATH AND CORONARY ANGIOGRAPHY N/A 08/25/2017   Procedure: RIGHT/LEFT HEART CATH AND CORONARY ANGIOGRAPHY;  Surgeon: Verlin Lonni BIRCH, MD;  Location: [facility];  Service: Cardiovascular;  Laterality: N/A;   RIGHT/LEFT HEART CATH AND CORONARY ANGIOGRAPHY N/A 10/01/2019   Procedure: RIGHT/LEFT HEART CATH AND CORONARY ANGIOGRAPHY;  Surgeon: Cherrie Toribio SAUNDERS, MD;  Location: [facility];  Service: Cardiovascular;  Laterality: N/A;   RIGHT/LEFT HEART CATH AND CORONARY ANGIOGRAPHY N/A 04/07/2021    Procedure: RIGHT/LEFT HEART CATH AND CORONARY ANGIOGRAPHY;  Surgeon: Cherrie Toribio SAUNDERS, MD;  Location: [facility];  Service: Cardiovascular;  Laterality: N/A;   FAMILY HISTORY Family History  Problem Relation Age of Onset   Heart disease Mother    Heart attack Mother 56   Hypertension Mother    Hyperlipidemia Mother    Diabetes Mother    Heart disease Father    Heart attack Father 60   Hypertension Father    Hyperlipidemia Father    Diabetes Father    Heart attack Sister 83   Colon cancer Neg Hx    Stomach cancer Neg Hx    Esophageal cancer Neg Hx    Rectal cancer Neg Hx    Colon polyps Neg Hx    Lung disease Neg Hx    SOCIAL HISTORY Social History   Tobacco Use   Smoking status: Never   Smokeless tobacco: Never  Vaping Use   Vaping status: Never Used  Substance Use Topics   Alcohol use: No   Drug use: No       OPHTHALMIC EXAM:  Base Eye Exam     Visual Acuity (Snellen - Linear)       Right Left   Dist Arrey 20/250 -2 20/30 -2         Tonometry (Tonopen, 8:41 AM)       Right Left   Pressure 21 17         Pupils       Dark Light Shape React APD   Right 3 2 Round Brisk None   Left 3 2 Round Brisk None         Visual Fields (Counting fingers)       Left Right    Full Full         Extraocular Movement       Right Left    Full, Ortho Full, Ortho         Neuro/Psych     Oriented x3: Yes   Mood/Affect: Normal         Dilation     Both eyes: 1.0% Mydriacyl, 2.5% Phenylephrine  @ 8:40 AM           Slit Lamp and Fundus Exam     Slit Lamp Exam       Right Left   Lids/Lashes Dermatochalasis - upper lid, Meibomian gland dysfunction Dermatochalasis - upper lid, Meibomian gland dysfunction   Conjunctiva/Sclera nasal and temporal  pinguecula, Melanosis nasal and temporal pinguecula, Melanosis   Cornea arcus arcus, 2+ inferior Punctate epithelial erosions   Anterior Chamber deep, clear, narrow temporal angle Deep and quiet    Iris Round and dilated, No NVI Round and dilated, No NVI   Lens 2-3+ Nuclear sclerosis, 2-3+ Cortical cataract 2-3+ Nuclear sclerosis, 2-3+ Cortical cataract   Anterior Vitreous Vitreous syneresis, mild Asteroid hyalosis Vitreous syneresis         Fundus Exam       Right Left   Disc Pink and Sharp, no NVD Pink and Sharp, no NVD, Compact   C/D Ratio 0.2 0.2   Macula Blunted foveal reflex, central edema with diffuse exudates greatest centrally--slightly improved, scattered MA/DBH Blunted foveal reflex, central edema with scattered exudates, scattered MA/DBH   Vessels attenuated, Tortuous, ST venule dilated attenuated, Tortuous   Periphery Attached, scattered MA/DBH, exudates greatest posteriorly Attached, scattered MA/DBH and exudates greatest posteriorly           IMAGING AND PROCEDURES  Imaging and Procedures for 03/28/2024  OCT, Retina - OU - Both Eyes       Right Eye Quality was good. Central Foveal Thickness: 326. Progression has improved. Findings include no SRF, abnormal foveal contour, subretinal hyper-reflective material, intraretinal hyper-reflective material, intraretinal fluid, vitreomacular adhesion (Persistent central edema with interval improvement in IRF and mild interval increase in central IRHM/SRHM).   Left Eye Quality was good. Central Foveal Thickness: 221. Progression has improved. Findings include no SRF, abnormal foveal contour, intraretinal hyper-reflective material, intraretinal fluid, subretinal fluid (Persistent IRF/IRHM--slightly improved).   Notes *Images captured and stored on drive  Diagnosis / Impression:  Central DME OU OD: Persistent central edema with interval improvement in IRF and mild interval increase in central IRHM/SRHM OS: Persistent IRF/IRHM--slightly improved  Clinical management:  See below  Abbreviations: NFP - Normal foveal profile. CME - cystoid macular edema. PED - pigment epithelial detachment. IRF - intraretinal fluid.  SRF - subretinal fluid. EZ - ellipsoid zone. ERM - epiretinal membrane. ORA - outer retinal atrophy. ORT - outer retinal tubulation. SRHM - subretinal hyper-reflective material. IRHM - intraretinal hyper-reflective material      Intravitreal Injection, Pharmacologic Agent - OS - Left Eye       Time Out 03/28/2024. 9:36 AM. Confirmed correct patient, procedure, site, and patient consented.   Anesthesia Topical anesthesia was used. Anesthetic medications included Lidocaine  2%, Proparacaine 0.5%.   Procedure Preparation included 5% betadine to ocular surface, eyelid speculum. A (32g) needle was used.   Injection: 1.25 mg Bevacizumab  1.25mg /0.46ml   Route: Intravitreal, Site: Left Eye   NDC: C2662926, Lot: [id number], Expiration date: 07/13/2024   Post-op Post injection exam found visual acuity of at least counting fingers. The patient tolerated the procedure well. There were no complications. The patient received written and verbal post procedure care education. Post injection medications were not given.      Intravitreal Injection, Pharmacologic Agent - OD - Right Eye       Time Out 03/28/2024. 9:36 AM. Confirmed correct patient, procedure, site, and patient consented.   Anesthesia Topical anesthesia was used. Anesthetic medications included Lidocaine  2%, Proparacaine 0.5%.   Procedure Preparation included 5% betadine to ocular surface, eyelid speculum. A (32g) needle was used.   Injection: 1.25 mg Bevacizumab  1.25mg /0.39ml   Route: Intravitreal, Site: Right Eye   NDC: C2662926, Lot: [id number], Expiration date: 05/30/2024   Post-op Post injection exam found visual acuity of at least counting fingers. The patient tolerated the procedure well. There were  no complications. The patient received written and verbal post procedure care education. Post injection medications were not given.            ASSESSMENT/PLAN:    ICD-10-CM   1. Severe nonproliferative diabetic  retinopathy of both eyes with macular edema associated with type 2 diabetes mellitus (HCC)  E11.3413 OCT, Retina - OU - Both Eyes    Intravitreal Injection, Pharmacologic Agent - OS - Left Eye    Intravitreal Injection, Pharmacologic Agent - OD - Right Eye    Bevacizumab  (AVASTIN ) SOLN 1.25 mg    Bevacizumab  (AVASTIN ) SOLN 1.25 mg    2. Current use of insulin  (HCC)  Z79.4     3. Essential hypertension  I10     4. Hypertensive retinopathy of both eyes  H35.033     5. Combined forms of age-related cataract of both eyes  H25.813      1,2. Severe Non-proliferative diabetic retinopathy w/ DME, OU - delayed f/u - 9 wks instead of 6 (05.14.25 to 07.17.25)  - delayed f/u - 8 wks instead of 4 (3.17.25 to 05.14.25)  - pt delayed from 4 weeks to 6.5 weeks (01.29.25 to 03.17.25)  - pt delayed from 4 weeks to 12 weeks (11.06.24 to 01.29.25) due to being in the hospital multiple times  - pt delayed from 4 weeks to 5+ months (04.24.24 to 10.09.24) due to other medical procedures  - pt delayed from 4 weeks to 11 months (04.18.22 to 03.27.23) due to other health issues  - pt was delayed from 4 weeks to 9 weeks (12.14.21 to 2.21.22) due to financial / insurance reasons  - A1c 12.0 on 09.18.24  - s/p IVA OS #1 (09.16.21), IVA OD #1 (09.21.21)             - s/p IVA OU #2 (10.19.21), #3 (11.16.21), #4 (12.14.21), #5 (02.21.22), #6 (03.21.22), #7 (04.18.22), #8 (03.27.23), #9 (04.24.23), #10 (10.03.24), #11 (11.06.24), #12 (01.29.25), #13 (03.17.25), #14 (05.14.25),  #15 (07.17.25),#16 (08.21.25) - exam shows central edema OU, scattered IRH and severe exudates OU - FA (09.16.21) shows late leaking MA greatest posterior pole; no NV; +peripheral vascular perfusion defects -- may benefit from peripheral PRP OU  - OCT shows OD: Persistent central edema with interval improvement in IRF and mild interval increase in central IRHM/SRHM; OS: Persistent IRF/IRHM--slightly improved at 5 weeks  - BCVA OD 20/250 stable;  OS: 20/30 stable - recommend IVA today OU #17 (09.25.25) w/ f/u 5 wks - pt wishes to proceed with injection - RBA of procedure discussed, questions answered - informed consent obtained and signed - see procedure note - Avastin  informed consent obtained, re-signed and scanned, 01.29.25 (OU) - Pt approved for Eylea but would owe 20%.  - f/u 5 weeks, DFE, OCT, possible injection(s)  3,4. Hypertensive retinopathy OU - discussed importance of tight BP control - monitor  5. Mixed Cataract OU - The symptoms of cataract, surgical options, and treatments and risks were discussed with patient. - discussed diagnosis and progression - now following with Dr. Fleeta  Ophthalmic Meds Ordered this visit:  Meds ordered this encounter  Medications   Bevacizumab  (AVASTIN ) SOLN 1.25 mg   Bevacizumab  (AVASTIN ) SOLN 1.25 mg     Return in about 5 weeks (around 05/02/2024) for NPDR OU, DFE, OCT, Possible Injxn.  There are no Patient Instructions on file for this visit.  This document serves as a record of services personally performed by Redell JUDITHANN Hans, MD, PhD. It was created on their  behalf by Auston Muzzy, COMT. The creation of this record is the provider's dictation and/or activities during the visit.  Electronically signed by: Auston Muzzy, COMT 04/01/24 9:55 PM  This document serves as a record of services personally performed by Redell JUDITHANN Hans, MD, PhD. It was created on their behalf by Almetta Pesa, an ophthalmic technician. The creation of this record is the provider's dictation and/or activities during the visit.    Electronically signed by: Almetta Pesa, OA, 04/01/24  9:55 PM  Redell JUDITHANN Hans, M.D., Ph.D. Diseases & Surgery of the Retina and Vitreous [facility]  I have reviewed the above documentation for accuracy and completeness, and I agree with the above. Redell JUDITHANN Hans, M.D., Ph.D. 04/01/24 9:57 PM   Abbreviations: M myopia (nearsighted); A  astigmatism; H hyperopia (farsighted); P presbyopia; Mrx spectacle prescription;  CTL contact lenses; OD right eye; OS left eye; OU both eyes  XT exotropia; ET esotropia; PEK punctate epithelial keratitis; PEE punctate epithelial erosions; DES dry eye syndrome; MGD meibomian gland dysfunction; ATs artificial tears; PFAT's preservative free artificial tears; NSC nuclear sclerotic cataract; PSC posterior subcapsular cataract; ERM epi-retinal membrane; PVD posterior vitreous detachment; RD retinal detachment; DM diabetes mellitus; DR diabetic retinopathy; NPDR non-proliferative diabetic retinopathy; PDR proliferative diabetic retinopathy; CSME clinically significant macular edema; DME diabetic macular edema; dbh dot blot hemorrhages; CWS cotton wool spot; POAG primary open angle glaucoma; C/D cup-to-disc ratio; HVF humphrey visual field; GVF goldmann visual field; OCT optical coherence tomography; IOP intraocular pressure; BRVO Branch retinal vein occlusion; CRVO central retinal vein occlusion; CRAO central retinal artery occlusion; BRAO branch retinal artery occlusion; RT retinal tear; SB scleral buckle; PPV pars plana vitrectomy; VH Vitreous hemorrhage; PRP panretinal laser photocoagulation; IVK intravitreal kenalog ; VMT vitreomacular traction; MH Macular hole;  NVD neovascularization of the disc; NVE neovascularization elsewhere; AREDS age related eye disease study; ARMD age related macular degeneration; POAG primary open angle glaucoma; EBMD epithelial/anterior basement membrane dystrophy; ACIOL anterior chamber intraocular lens; IOL intraocular lens; PCIOL posterior chamber intraocular lens; Phaco/IOL phacoemulsification with intraocular lens placement; PRK photorefractive keratectomy; LASIK laser assisted in situ keratomileusis; HTN hypertension; DM diabetes mellitus; COPD chronic obstructive pulmonary disease

## 2024-03-27 LAB — LAB REPORT - SCANNED
A1c: 12.2
Albumin, Urine POC: 121.1
Creatinine, POC: 113.6 mg/dL
Microalb Creat Ratio: 107

## 2024-03-28 ENCOUNTER — Ambulatory Visit (INDEPENDENT_AMBULATORY_CARE_PROVIDER_SITE_OTHER): Admitting: Ophthalmology

## 2024-03-28 ENCOUNTER — Encounter (INDEPENDENT_AMBULATORY_CARE_PROVIDER_SITE_OTHER): Payer: Self-pay | Admitting: Ophthalmology

## 2024-03-28 DIAGNOSIS — Z794 Long term (current) use of insulin: Secondary | ICD-10-CM | POA: Diagnosis not present

## 2024-03-28 DIAGNOSIS — I10 Essential (primary) hypertension: Secondary | ICD-10-CM

## 2024-03-28 DIAGNOSIS — H25813 Combined forms of age-related cataract, bilateral: Secondary | ICD-10-CM

## 2024-03-28 DIAGNOSIS — E113413 Type 2 diabetes mellitus with severe nonproliferative diabetic retinopathy with macular edema, bilateral: Secondary | ICD-10-CM

## 2024-03-28 DIAGNOSIS — H35033 Hypertensive retinopathy, bilateral: Secondary | ICD-10-CM | POA: Diagnosis not present

## 2024-03-28 MED ORDER — BEVACIZUMAB CHEMO INJECTION 1.25MG/0.05ML SYRINGE FOR KALEIDOSCOPE
1.2500 mg | INTRAVITREAL | Status: AC | PRN
  Administered 2024-03-28: 1.25 mg via INTRAVITREAL

## 2024-04-15 NOTE — Progress Notes (Deleted)
  Cardiology Office Note:  .   Date:  04/15/2024  ID:  Calvin [name], DOB Feb 22, 1961, MRN [id number] [facility]CP: [facility] Providers Cardiologist:  Lonni Cash, MD Advanced Heart Failure:  Toribio Fuel, MD { Click to update primary MD,subspecialty MD or APP then REFRESH:1}   History of Present Illness: .   No chief complaint on file.   Calvin [name] is a 63 y.o. male with history of CHF, DM, HTN, CAD, pAF who presents for follow-up.      Problem List Non-obstructive CAD -40% LAD; 40% LCX, 30% RCA 04/07/2021 2. Systolic HF -EF 25-30% 09/11/2023 -EF 30-35% 03/22/2023 -EF 35-40% 02/23/2022 -EF 25-30% 09/11/2023 -CMR negative 03/21/2021 -failed entresto  due to low BP 3. DM -A1c 12.0  4. Paroxysmal Afib 5. HTN    ROS: All other ROS reviewed and negative. Pertinent positives noted in the HPI.     Studies Reviewed: SABRA       TTE 09/11/2023  1. Left ventricular ejection fraction, by estimation, is 25 to 30%. The  left ventricle has severely decreased function. The left ventricle  demonstrates global hypokinesis. There is mild concentric left ventricular  hypertrophy. Left ventricular diastolic   parameters are consistent with Grade I diastolic dysfunction (impaired  relaxation). The average left ventricular global longitudinal strain is  -6.1 %. The global longitudinal strain is abnormal.   2. Right ventricular systolic function is normal. The right ventricular  size is normal.   3. The mitral valve is normal in structure. No evidence of mitral valve  regurgitation. No evidence of mitral stenosis.   4. The aortic valve is tricuspid. There is mild calcification of the  aortic valve. Aortic valve regurgitation is not visualized. Aortic valve  sclerosis/calcification is present, without any evidence of aortic  stenosis.   5. The inferior vena cava is normal in size with greater than 50%  respiratory variability, suggesting  right atrial pressure of 3 mmHg.  Physical Exam:   VS:  There were no vitals taken for this visit.   Wt Readings from Last 3 Encounters:  03/22/24 165 lb (74.8 kg)  03/14/24 161 lb (73 kg)  01/11/24 161 lb (73 kg)    GEN: Well nourished, well developed in no acute distress NECK: No JVD; No carotid bruits CARDIAC: ***RRR, no murmurs, rubs, gallops RESPIRATORY:  Clear to auscultation without rales, wheezing or rhonchi  ABDOMEN: Soft, non-tender, non-distended EXTREMITIES:  No edema; No deformity  ASSESSMENT AND PLAN: .   ***    {Are you ordering a CV Procedure (e.g. stress test, cath, DCCV, TEE, etc)?   Press F2        :[id number]}   Follow-up: No follow-ups on file.  Time Spent with Patient: I have spent a total of *** minutes caring for this patient today face to face, ordering and reviewing labs/tests, reviewing prior records/medical history, examining the patient, establishing an assessment and plan, communicating results/findings to the patient/family, and documenting in the medical record.   Signed, Darryle DASEN. Barbaraann, MD, [facility]  [facility]  [address] [place], [place] [address] [phone]  12:31 PM

## 2024-04-17 ENCOUNTER — Ambulatory Visit: Attending: Cardiovascular Disease | Admitting: Cardiovascular Disease

## 2024-04-17 DIAGNOSIS — E782 Mixed hyperlipidemia: Secondary | ICD-10-CM

## 2024-04-17 DIAGNOSIS — I5022 Chronic systolic (congestive) heart failure: Secondary | ICD-10-CM

## 2024-04-17 DIAGNOSIS — I2510 Atherosclerotic heart disease of native coronary artery without angina pectoris: Secondary | ICD-10-CM

## 2024-04-17 DIAGNOSIS — I480 Paroxysmal atrial fibrillation: Secondary | ICD-10-CM

## 2024-05-02 ENCOUNTER — Encounter (INDEPENDENT_AMBULATORY_CARE_PROVIDER_SITE_OTHER): Admitting: Ophthalmology

## 2024-05-03 ENCOUNTER — Ambulatory Visit (INDEPENDENT_AMBULATORY_CARE_PROVIDER_SITE_OTHER): Admitting: Family Medicine

## 2024-05-03 ENCOUNTER — Encounter: Payer: Self-pay | Admitting: Family Medicine

## 2024-05-03 VITALS — BP 136/87 | HR 100 | Ht 66.0 in | Wt 167.6 lb

## 2024-05-03 DIAGNOSIS — E113413 Type 2 diabetes mellitus with severe nonproliferative diabetic retinopathy with macular edema, bilateral: Secondary | ICD-10-CM

## 2024-05-03 DIAGNOSIS — E1159 Type 2 diabetes mellitus with other circulatory complications: Secondary | ICD-10-CM | POA: Diagnosis not present

## 2024-05-03 DIAGNOSIS — E1165 Type 2 diabetes mellitus with hyperglycemia: Secondary | ICD-10-CM

## 2024-05-03 DIAGNOSIS — Z7689 Persons encountering health services in other specified circumstances: Secondary | ICD-10-CM

## 2024-05-03 NOTE — Assessment & Plan Note (Signed)
 Last A1C in September 12, uncontrolled, started on Ozempic at that time. I replaced pt's Libre sensor for him today. There is room to optimize his diabetic regimen but we will see what his BGL is over the next few weeks, recheck A1c at next visit and then consider med adjustment. Scheduled with pharmacy clinic for pt assistance

## 2024-05-03 NOTE — Progress Notes (Signed)
 New Patient Office Visit  Subjective    Patient ID: Calvin [name], male    DOB: 1960/08/23  Age: 63 y.o. MRN: [id number]  CC:  Chief Complaint  Patient presents with   Establish Care    HPI Calvin [name] presents to establish care. No concerns today. Presents with records from prior office.   Last A1c 12.2 March 28, 2019 Regimen updated in [facility] Diagnosed with diabetes in 1990s Running 250s-300s on libre he reports. Dropped to 48 at church two weeks ago, typically does not drop.   MI around 2000 Due for colonoscopy next year Never smoked, no alcohol  Medication list updated and reviewed   Problem list updated and reviewed  Past Surgical History:  Procedure Laterality Date   BIOPSY  11/22/2021   Procedure: BIOPSY;  Surgeon: Abran Norleen SAILOR, MD;  Location: [facility];  Service: Gastroenterology;;   COLONOSCOPY  05/13/11, 08/2017   9 adenomas   COLONOSCOPY WITH PROPOFOL  N/A 11/22/2021   Procedure: COLONOSCOPY WITH PROPOFOL ;  Surgeon: Abran Norleen SAILOR, MD;  Location: [facility];  Service: Gastroenterology;  Laterality: N/A;   ESOPHAGOGASTRODUODENOSCOPY (EGD) WITH PROPOFOL  N/A 11/22/2021   Procedure: ESOPHAGOGASTRODUODENOSCOPY (EGD) WITH PROPOFOL ;  Surgeon: Abran Norleen SAILOR, MD;  Location: [facility];  Service: Gastroenterology;  Laterality: N/A;   FOREARM SURGERY     MUSCLE BIOPSY     POLYPECTOMY     POLYPECTOMY  11/22/2021   Procedure: POLYPECTOMY;  Surgeon: Abran Norleen SAILOR, MD;  Location: [facility];  Service: Gastroenterology;;   RIGHT/LEFT HEART CATH AND CORONARY ANGIOGRAPHY N/A 08/25/2017   Procedure: RIGHT/LEFT HEART CATH AND CORONARY ANGIOGRAPHY;  Surgeon: Verlin Lonni BIRCH, MD;  Location: [facility];  Service: Cardiovascular;  Laterality: N/A;   RIGHT/LEFT HEART CATH AND CORONARY ANGIOGRAPHY N/A 10/01/2019   Procedure: RIGHT/LEFT HEART CATH AND CORONARY ANGIOGRAPHY;  Surgeon: Cherrie Toribio SAUNDERS, MD;  Location: [facility];  Service:  Cardiovascular;  Laterality: N/A;   RIGHT/LEFT HEART CATH AND CORONARY ANGIOGRAPHY N/A 04/07/2021   Procedure: RIGHT/LEFT HEART CATH AND CORONARY ANGIOGRAPHY;  Surgeon: Cherrie Toribio SAUNDERS, MD;  [facility];  Service: Cardiovascular;  Laterality: N/A;    Family History  Problem Relation Age of Onset   Heart disease Mother    Heart attack Mother 16   Hypertension Mother    Hyperlipidemia Mother    Diabetes Mother    Heart disease Father    Heart attack Father 19   Hypertension Father    Hyperlipidemia Father    Diabetes Father    Heart attack Sister 25   Colon cancer Neg Hx    Stomach cancer Neg Hx    Esophageal cancer Neg Hx    Rectal cancer Neg Hx    Colon polyps Neg Hx    Lung disease Neg Hx     Social History   Socioeconomic History   Marital status: Single    Spouse name: Not on file   Number of children: 0   Years of education: 13   Highest education level: Some college, no degree  Occupational History   Occupation: Disability  Tobacco Use   Smoking status: Never   Smokeless tobacco: Never  Vaping Use   Vaping status: Never Used  Substance and Sexual Activity   Alcohol use: No   Drug use: No   Sexual activity: Yes    Birth control/protection: None  Other Topics Concern   Not on file  Social History Narrative   Social History  Diet?    Do you drink/eat things with caffeine? yes   Marital status?       single      Do you live in a house, apartment, assisted living, condo, trailer, etc.? yes   Is it one or more stories? One story   How many persons live in your home?   Do you have any pets in your home? (please list)    Highest level of education completed? graduate   Do you exercise?            no                          Type & how often?   Advanced Directives   Do you have a living will? no   Do you have a DNR form?                                  If not, do you want to discuss one? no   Do you have signed POA/HPOA for forms? no       Functional Status   Do you have difficulty bathing or dressing yourself? no   Do you have difficulty preparing food or eating? no   Do you have difficulty managing your medications? no   Do you have difficulty managing your finances? no   Do you have difficulty affording your medications? no   Social Drivers of Corporate Investment Banker Strain: Not on file  Food Insecurity: No Food Insecurity (08/25/2021)   Hunger Vital Sign    Worried About Running Out of Food in the Last Year: Never true    Ran Out of Food in the Last Year: Never true  Transportation Needs: Unknown (02/15/2021)   PRAPARE - Administrator, Civil Service (Medical): No    Lack of Transportation (Non-Medical): Not on file  Physical Activity: Not on file  Stress: Not on file  Social Connections: Not on file  Intimate Partner Violence: Not on file         Objective    BP 136/87   Pulse 100   Ht 5' 6 (1.676 m)   Wt 167 lb 9.6 oz (76 kg)   SpO2 100%   BMI 27.05 kg/m   General: well appearing, NAD Cardiovascular: RRR, no m/r/g Respiratory: normal work of breathing on RA, CTAB Abdomen: Normal bowel sounds, soft, non-tender Extremities: No swelling BLE     Assessment & Plan:   Assessment & Plan Encounter to establish care Problem list, history, medications reviewed. Advanced directive paperwork provided for pt to fill out.  Poorly controlled type 2 diabetes mellitus with circulatory disorder (HCC) Last A1C in September 12, uncontrolled, started on Ozempic at that time. I replaced pt's Libre sensor for him today. There is room to optimize his diabetic regimen but we will see what his BGL is over the next few weeks, recheck A1c at next visit and then consider med adjustment. Scheduled with pharmacy clinic for pt assistance   Follow up in 2 months   Calvin Dennin, DO

## 2024-05-03 NOTE — Patient Instructions (Signed)
 Good to see you today - Thank you for coming in  Things we discussed today:  We will see you in December!

## 2024-05-10 NOTE — Progress Notes (Signed)
 [facility] - Clinic Note  05/15/2024     CHIEF COMPLAINT Patient presents for No chief complaint on file.   HISTORY OF PRESENT ILLNESS: Calvin [name] is a 63 y.o. male who presents to the clinic today for:   Pt states VA is the same, no changes. BS levels are around 340, reports compliant w/ meds. Increased Trulicity.     Referring physician: Fleeta Gentile MD [address], [address] [place], [place] [address]  HISTORICAL INFORMATION:   Selected notes from the MEDICAL RECORD NUMBER Referred by Dr. Gentile Fleeta LEE: 09/27/21 BCVA OD 20/80 OS 20/60-2 Ocular history: NPDR, Medical history: DM   CURRENT MEDICATIONS: No current outpatient medications on file. (Ophthalmic Drugs)   No current facility-administered medications for this visit. (Ophthalmic Drugs)   Current Outpatient Medications (Other)  Medication Sig   amLODipine (NORVASC) 10 MG tablet Take 10 mg by mouth daily.   apixaban  (ELIQUIS ) 5 MG TABS tablet Take 1 tablet (5 mg total) by mouth 2 (two) times daily.   Blood Glucose Monitoring Suppl (ONETOUCH VERIO FLEX SYSTEM) w/Device KIT 1 each by Other route daily.   carvedilol  (COREG ) 6.25 MG tablet Take 1 tablet (6.25 mg total) by mouth 2 (two) times daily with a meal.   Continuous Glucose Sensor (FREESTYLE LIBRE 3 PLUS SENSOR) MISC by Does not apply route. Change sensor every 15 days.   dapagliflozin  propanediol (FARXIGA ) 10 MG TABS tablet Take 1 tablet (10 mg total) by mouth every morning.   docusate sodium  (COLACE) 100 MG capsule Take 100 mg by mouth daily as needed for mild constipation.   donepezil  (ARICEPT ) 10 MG tablet TAKE ONE TABLET BY MOUTH EVERYDAY AT BEDTIME   Evolocumab  (REPATHA ) 140 MG/ML SOSY Inject 140 mg as directed every 14 (fourteen) days.   ezetimibe  (ZETIA ) 10 MG tablet Take 1 tablet (10 mg total) by mouth every morning.   glipiZIDE (GLUCOTROL) 5 MG tablet Take 5 mg by mouth daily.   insulin  glargine (LANTUS ) 100 UNIT/ML  injection Inject 40 Units into the skin daily. and 16 units at night   insulin  lispro (HUMALOG) 100 UNIT/ML injection Inject 19 Units into the skin 3 (three) times daily before meals.   Insulin  Syringe-Needle U-100 (INSULIN  SYRINGE .3CC/29GX1/2) 29G X 1/2 0.3 ML MISC Inject 1 Syringe 3 (three) times daily as directed. Check blood sugar three times daily. Dx: E11.9   Lancets (ONETOUCH DELICA PLUS LANCET30G) MISC USE TWICE DAILY   losartan  (COZAAR ) 25 MG tablet Take 1 tablet (25 mg total) by mouth daily.   pantoprazole  (PROTONIX ) 40 MG tablet TAKE ONE TABLET BY MOUTH EVERY MORNING and TAKE ONE TABLET BY MOUTH EVERYDAY AT BEDTIME   polyethylene glycol (MIRALAX  / GLYCOLAX ) 17 g packet Take 17 g by mouth 2 (two) times daily.   rosuvastatin  (CRESTOR ) 40 MG tablet Take 1 tablet (40 mg total) by mouth at bedtime.   Semaglutide, 1 MG/DOSE, (OZEMPIC, 1 MG/DOSE,) 4 MG/3ML SOPN Inject 1 mg into the skin once a week.   sildenafil  (VIAGRA ) 100 MG tablet Take 1 tablet (100 mg total) by mouth daily as needed for erectile dysfunction.   spironolactone  (ALDACTONE ) 25 MG tablet Take 1 tablet (25 mg total) by mouth every morning.   No current facility-administered medications for this visit. (Other)   REVIEW OF SYSTEMS:   ALLERGIES Allergies  Allergen Reactions   Metformin  Nausea And Vomiting    PAST MEDICAL HISTORY Past Medical History:  Diagnosis Date   Adenoidal hypertrophy  Adrenal nodule 09/21/2020   AF (paroxysmal atrial fibrillation) 03/10/2018   Arthritis    Cataract    Mixed OU   Chronic anticoagulation 10/15/2019   Chronic systolic heart failure 11/22/2015   Colonic ulcer    Coronary artery disease    a. cath in 08/2017 showing mild nonobstructive CAD with scattered 20-30% stenosis.    Diabetic neuropathy 09/26/2017   Diabetic retinopathy    NPDR OU   Essential hypertension 11/22/2015   Foot pain, bilateral 02/01/2019   Gastroesophageal reflux disease 05/10/2017   Heart attack     [place] living [place]   History of adenomatous colonic polyps 05/19/2011   05/2011 9 adenomas 08/2017 5 adenomas (dimin) Recall 2022   History of COVID-19 06/18/2019   HLD (hyperlipidemia) 11/20/2015   Insomnia 05/10/2017   Intermittent claudication 05/25/2020   Ischemic colitis    Mild neurocognitive disorder due to Alzheimer's disease, possible 08/24/2021   Nonischemic cardiomyopathy    a. EF 20-25% by echo in 09/2017 with cath showing mild CAD. b.  Last echo 12/2017 EF 35-40%, grade 2 DD.   Obesity    Onychomycosis of multiple toenails with type 2 diabetes mellitus and peripheral neuropathy 05/10/2017   Poorly controlled type 2 diabetes mellitus with circulatory disorder (HCC) 11/20/2015   Right sided weakness 06/17/2019   Stage 3 chronic kidney disease (HCC)    Stage 3a chronic kidney disease (CKD) (HCC) 11/19/2021   Type II diabetes mellitus 11/20/2015   Unsteady gait 12/20/2018   Past Surgical History:  Procedure Laterality Date   BIOPSY  11/22/2021   Procedure: BIOPSY;  Surgeon: Abran Norleen SAILOR, MD;  Location: [facility];  Service: Gastroenterology;;   COLONOSCOPY  05/13/11, 08/2017   9 adenomas   COLONOSCOPY WITH PROPOFOL  N/A 11/22/2021   Procedure: COLONOSCOPY WITH PROPOFOL ;  Surgeon: Abran Norleen SAILOR, MD;  Location: [facility];  Service: Gastroenterology;  Laterality: N/A;   ESOPHAGOGASTRODUODENOSCOPY (EGD) WITH PROPOFOL  N/A 11/22/2021   Procedure: ESOPHAGOGASTRODUODENOSCOPY (EGD) WITH PROPOFOL ;  Surgeon: Abran Norleen SAILOR, MD;  Location: [facility];  Service: Gastroenterology;  Laterality: N/A;   FOREARM SURGERY     MUSCLE BIOPSY     POLYPECTOMY     POLYPECTOMY  11/22/2021   Procedure: POLYPECTOMY;  Surgeon: Abran Norleen SAILOR, MD;  Location: [facility];  Service: Gastroenterology;;   RIGHT/LEFT HEART CATH AND CORONARY ANGIOGRAPHY N/A 08/25/2017   Procedure: RIGHT/LEFT HEART CATH AND CORONARY ANGIOGRAPHY;  Surgeon: Verlin Lonni BIRCH, MD;  [facility];  Service:  Cardiovascular;  Laterality: N/A;   RIGHT/LEFT HEART CATH AND CORONARY ANGIOGRAPHY N/A 10/01/2019   Procedure: RIGHT/LEFT HEART CATH AND CORONARY ANGIOGRAPHY;  Surgeon: Cherrie Toribio SAUNDERS, MD;  [facility];  Service: Cardiovascular;  Laterality: N/A;   RIGHT/LEFT HEART CATH AND CORONARY ANGIOGRAPHY N/A 04/07/2021   Procedure: RIGHT/LEFT HEART CATH AND CORONARY ANGIOGRAPHY;  Surgeon: Cherrie Toribio SAUNDERS, MD;  [facility];  Service: Cardiovascular;  Laterality: N/A;   FAMILY HISTORY Family History  Problem Relation Age of Onset   Heart disease Mother    Heart attack Mother 64   Hypertension Mother    Hyperlipidemia Mother    Diabetes Mother    Heart disease Father    Heart attack Father 32   Hypertension Father    Hyperlipidemia Father    Diabetes Father    Heart attack Sister 91   Colon cancer Neg Hx    Stomach cancer Neg Hx    Esophageal cancer Neg Hx  Rectal cancer Neg Hx    Colon polyps Neg Hx    Lung disease Neg Hx    SOCIAL HISTORY Social History   Tobacco Use   Smoking status: Never   Smokeless tobacco: Never  Vaping Use   Vaping status: Never Used  Substance Use Topics   Alcohol use: No   Drug use: No       OPHTHALMIC EXAM:  Not recorded    IMAGING AND PROCEDURES  Imaging and Procedures for 05/15/2024          ASSESSMENT/PLAN:  No diagnosis found.  1,2. Severe Non-proliferative diabetic retinopathy w/ DME, OU - delayed f/u - 9 wks instead of 6 (05.14.25 to 07.17.25)  - delayed f/u - 8 wks instead of 4 (3.17.25 to 05.14.25)  - pt delayed from 4 weeks to 6.5 weeks (01.29.25 to 03.17.25)  - pt delayed from 4 weeks to 12 weeks (11.06.24 to 01.29.25) due to being in the hospital multiple times  - pt delayed from 4 weeks to 5+ months (04.24.24 to 10.09.24) due to other medical procedures  - pt delayed from 4 weeks to 11 months (04.18.22 to 03.27.23) due to other health issues  - pt was delayed from 4 weeks to 9 weeks  (12.14.21 to 2.21.22) due to financial / insurance reasons  - A1c 12.0 on 09.18.24  - s/p IVA OS #1 (09.16.21), IVA OD #1 (09.21.21)             - s/p IVA OU #2 (10.19.21), #3 (11.16.21), #4 (12.14.21), #5 (02.21.22), #6 (03.21.22), #7 (04.18.22), #8 (03.27.23), #9 (04.24.23), #10 (10.03.24), #11 (11.06.24), #12 (01.29.25), #13 (03.17.25), #14 (05.14.25),  #15 (07.17.25),#16 (08.21.25), #17 (09.25.25) - exam shows central edema OU, scattered IRH and severe exudates OU - FA (09.16.21) shows late leaking MA greatest posterior pole; no NV; +peripheral vascular perfusion defects -- may benefit from peripheral PRP OU  - OCT shows OD: Persistent central edema with interval improvement in IRF and mild interval increase in central IRHM/SRHM; OS: Persistent IRF/IRHM--slightly improved at 5 weeks  - BCVA OD 20/250 stable; OS: 20/30 stable - recommend IVA today OU #18 (11.12.25) w/ f/u 5 wks - pt wishes to proceed with injection - RBA of procedure discussed, questions answered - informed consent obtained and signed - see procedure note - Avastin  informed consent obtained, re-signed and scanned, 01.29.25 (OU) - Pt approved for Eylea but would owe 20%.  - f/u 5 weeks, DFE, OCT, possible injection(s)  3,4. Hypertensive retinopathy OU - discussed importance of tight BP control - monitor  5. Mixed Cataract OU - The symptoms of cataract, surgical options, and treatments and risks were discussed with patient. - discussed diagnosis and progression - now following with Dr. Fleeta  Ophthalmic Meds Ordered this visit:  No orders of the defined types were placed in this encounter.    No follow-ups on file.  There are no Patient Instructions on file for this visit.   This document serves as a record of services personally performed by Redell JUDITHANN Hans, MD, PhD. It was created on their behalf by Almetta Pesa, an ophthalmic technician. The creation of this record is the provider's dictation and/or  activities during the visit.    Electronically signed by: Almetta Pesa, OA, 05/10/24  9:34 AM  Redell JUDITHANN Hans, M.D., Ph.D. Diseases & Surgery of the Retina and Vitreous [facility]   Abbreviations: M myopia (nearsighted); A astigmatism; H hyperopia (farsighted); P presbyopia; Mrx spectacle prescription;  CTL  contact lenses; OD right eye; OS left eye; OU both eyes  XT exotropia; ET esotropia; PEK punctate epithelial keratitis; PEE punctate epithelial erosions; DES dry eye syndrome; MGD meibomian gland dysfunction; ATs artificial tears; PFAT's preservative free artificial tears; NSC nuclear sclerotic cataract; PSC posterior subcapsular cataract; ERM epi-retinal membrane; PVD posterior vitreous detachment; RD retinal detachment; DM diabetes mellitus; DR diabetic retinopathy; NPDR non-proliferative diabetic retinopathy; PDR proliferative diabetic retinopathy; CSME clinically significant macular edema; DME diabetic macular edema; dbh dot blot hemorrhages; CWS cotton wool spot; POAG primary open angle glaucoma; C/D cup-to-disc ratio; HVF humphrey visual field; GVF goldmann visual field; OCT optical coherence tomography; IOP intraocular pressure; BRVO Branch retinal vein occlusion; CRVO central retinal vein occlusion; CRAO central retinal artery occlusion; BRAO branch retinal artery occlusion; RT retinal tear; SB scleral buckle; PPV pars plana vitrectomy; VH Vitreous hemorrhage; PRP panretinal laser photocoagulation; IVK intravitreal kenalog ; VMT vitreomacular traction; MH Macular hole;  NVD neovascularization of the disc; NVE neovascularization elsewhere; AREDS age related eye disease study; ARMD age related macular degeneration; POAG primary open angle glaucoma; EBMD epithelial/anterior basement membrane dystrophy; ACIOL anterior chamber intraocular lens; IOL intraocular lens; PCIOL posterior chamber intraocular lens; Phaco/IOL phacoemulsification with intraocular lens placement; PRK  photorefractive keratectomy; LASIK laser assisted in situ keratomileusis; HTN hypertension; DM diabetes mellitus; COPD chronic obstructive pulmonary disease

## 2024-05-15 ENCOUNTER — Encounter (INDEPENDENT_AMBULATORY_CARE_PROVIDER_SITE_OTHER): Payer: Self-pay | Admitting: Ophthalmology

## 2024-05-15 ENCOUNTER — Ambulatory Visit (INDEPENDENT_AMBULATORY_CARE_PROVIDER_SITE_OTHER): Admitting: Ophthalmology

## 2024-05-15 DIAGNOSIS — H25813 Combined forms of age-related cataract, bilateral: Secondary | ICD-10-CM

## 2024-05-15 DIAGNOSIS — E113413 Type 2 diabetes mellitus with severe nonproliferative diabetic retinopathy with macular edema, bilateral: Secondary | ICD-10-CM | POA: Diagnosis not present

## 2024-05-15 DIAGNOSIS — H35033 Hypertensive retinopathy, bilateral: Secondary | ICD-10-CM | POA: Diagnosis not present

## 2024-05-15 DIAGNOSIS — I10 Essential (primary) hypertension: Secondary | ICD-10-CM

## 2024-05-15 DIAGNOSIS — Z794 Long term (current) use of insulin: Secondary | ICD-10-CM | POA: Diagnosis not present

## 2024-05-15 MED ORDER — BEVACIZUMAB CHEMO INJECTION 1.25MG/0.05ML SYRINGE FOR KALEIDOSCOPE
1.2500 mg | INTRAVITREAL | Status: AC | PRN
  Administered 2024-05-15: 1.25 mg via INTRAVITREAL

## 2024-06-05 NOTE — Progress Notes (Shared)
 [facility] - Clinic Note  06/12/2024     CHIEF COMPLAINT Patient presents for No chief complaint on file.   HISTORY OF PRESENT ILLNESS: Calvin [name] is a 63 y.o. male who presents to the clinic today for:    Referring physician: Fleeta Gentile MD [address], [address] [place], [place] [address]  HISTORICAL INFORMATION:   Selected notes from the MEDICAL RECORD NUMBER Referred by Dr. Gentile Fleeta LEE: 09/27/21 BCVA OD 20/80 OS 20/60-2 Ocular history: NPDR, Medical history: DM   CURRENT MEDICATIONS: No current outpatient medications on file. (Ophthalmic Drugs)   No current facility-administered medications for this visit. (Ophthalmic Drugs)   Current Outpatient Medications (Other)  Medication Sig   amLODipine (NORVASC) 10 MG tablet Take 10 mg by mouth daily.   apixaban  (ELIQUIS ) 5 MG TABS tablet Take 1 tablet (5 mg total) by mouth 2 (two) times daily.   Blood Glucose Monitoring Suppl (ONETOUCH VERIO FLEX SYSTEM) w/Device KIT 1 each by Other route daily.   carvedilol  (COREG ) 6.25 MG tablet Take 1 tablet (6.25 mg total) by mouth 2 (two) times daily with a meal.   Continuous Glucose Sensor (FREESTYLE LIBRE 3 PLUS SENSOR) MISC by Does not apply route. Change sensor every 15 days.   dapagliflozin  propanediol (FARXIGA ) 10 MG TABS tablet Take 1 tablet (10 mg total) by mouth every morning.   docusate sodium  (COLACE) 100 MG capsule Take 100 mg by mouth daily as needed for mild constipation.   donepezil  (ARICEPT ) 10 MG tablet TAKE ONE TABLET BY MOUTH EVERYDAY AT BEDTIME   Evolocumab  (REPATHA ) 140 MG/ML SOSY Inject 140 mg as directed every 14 (fourteen) days.   ezetimibe  (ZETIA ) 10 MG tablet Take 1 tablet (10 mg total) by mouth every morning.   glipiZIDE (GLUCOTROL) 5 MG tablet Take 5 mg by mouth daily.   insulin  glargine (LANTUS ) 100 UNIT/ML injection Inject 40 Units into the skin daily. and 16 units at night   insulin  lispro (HUMALOG) 100 UNIT/ML injection  Inject 19 Units into the skin 3 (three) times daily before meals.   Insulin  Syringe-Needle U-100 (INSULIN  SYRINGE .3CC/29GX1/2) 29G X 1/2 0.3 ML MISC Inject 1 Syringe 3 (three) times daily as directed. Check blood sugar three times daily. Dx: E11.9   Lancets (ONETOUCH DELICA PLUS LANCET30G) MISC USE TWICE DAILY   losartan  (COZAAR ) 25 MG tablet Take 1 tablet (25 mg total) by mouth daily.   pantoprazole  (PROTONIX ) 40 MG tablet TAKE ONE TABLET BY MOUTH EVERY MORNING and TAKE ONE TABLET BY MOUTH EVERYDAY AT BEDTIME   polyethylene glycol (MIRALAX  / GLYCOLAX ) 17 g packet Take 17 g by mouth 2 (two) times daily.   rosuvastatin  (CRESTOR ) 40 MG tablet Take 1 tablet (40 mg total) by mouth at bedtime.   Semaglutide, 1 MG/DOSE, (OZEMPIC, 1 MG/DOSE,) 4 MG/3ML SOPN Inject 1 mg into the skin once a week.   sildenafil  (VIAGRA ) 100 MG tablet Take 1 tablet (100 mg total) by mouth daily as needed for erectile dysfunction.   spironolactone  (ALDACTONE ) 25 MG tablet Take 1 tablet (25 mg total) by mouth every morning.   No current facility-administered medications for this visit. (Other)   REVIEW OF SYSTEMS:    ALLERGIES Allergies  Allergen Reactions   Metformin  Nausea And Vomiting    PAST MEDICAL HISTORY Past Medical History:  Diagnosis Date   Adenoidal hypertrophy    Adrenal nodule 09/21/2020   AF (paroxysmal atrial fibrillation) 03/10/2018   Arthritis    Cataract  Mixed OU   Chronic anticoagulation 10/15/2019   Chronic systolic heart failure 11/22/2015   Colonic ulcer    Coronary artery disease    a. cath in 08/2017 showing mild nonobstructive CAD with scattered 20-30% stenosis.    Diabetic neuropathy 09/26/2017   Diabetic retinopathy    NPDR OU   Essential hypertension 11/22/2015   Foot pain, bilateral 02/01/2019   Gastroesophageal reflux disease 05/10/2017   Heart attack    While living in [place]   History of adenomatous colonic polyps 05/19/2011   05/2011 9 adenomas 08/2017 5 adenomas  (dimin) Recall 2022   History of COVID-19 06/18/2019   HLD (hyperlipidemia) 11/20/2015   Insomnia 05/10/2017   Intermittent claudication 05/25/2020   Ischemic colitis    Mild neurocognitive disorder due to Alzheimer's disease, possible 08/24/2021   Nonischemic cardiomyopathy    a. EF 20-25% by echo in 09/2017 with cath showing mild CAD. b.  Last echo 12/2017 EF 35-40%, grade 2 DD.   Obesity    Onychomycosis of multiple toenails with type 2 diabetes mellitus and peripheral neuropathy 05/10/2017   Poorly controlled type 2 diabetes mellitus with circulatory disorder (HCC) 11/20/2015   Right sided weakness 06/17/2019   Stage 3 chronic kidney disease (HCC)    Stage 3a chronic kidney disease (CKD) (HCC) 11/19/2021   Type II diabetes mellitus 11/20/2015   Unsteady gait 12/20/2018   Past Surgical History:  Procedure Laterality Date   BIOPSY  11/22/2021   Procedure: BIOPSY;  Surgeon: Abran Norleen SAILOR, MD;  Location: [facility];  Service: Gastroenterology;;   COLONOSCOPY  05/13/11, 08/2017   9 adenomas   COLONOSCOPY WITH PROPOFOL  N/A 11/22/2021   Procedure: COLONOSCOPY WITH PROPOFOL ;  Surgeon: Abran Norleen SAILOR, MD;  Location: [facility];  Service: Gastroenterology;  Laterality: N/A;   ESOPHAGOGASTRODUODENOSCOPY (EGD) WITH PROPOFOL  N/A 11/22/2021   Procedure: ESOPHAGOGASTRODUODENOSCOPY (EGD) WITH PROPOFOL ;  Surgeon: Abran Norleen SAILOR, MD;  Location: [facility];  Service: Gastroenterology;  Laterality: N/A;   FOREARM SURGERY     MUSCLE BIOPSY     POLYPECTOMY     POLYPECTOMY  11/22/2021   Procedure: POLYPECTOMY;  Surgeon: Abran Norleen SAILOR, MD;  Location: [facility];  Service: Gastroenterology;;   RIGHT/LEFT HEART CATH AND CORONARY ANGIOGRAPHY N/A 08/25/2017   Procedure: RIGHT/LEFT HEART CATH AND CORONARY ANGIOGRAPHY;  Surgeon: Verlin Lonni BIRCH, MD;  Location: [facility];  Service: Cardiovascular;  Laterality: N/A;   RIGHT/LEFT HEART CATH AND CORONARY ANGIOGRAPHY N/A 10/01/2019   Procedure:  RIGHT/LEFT HEART CATH AND CORONARY ANGIOGRAPHY;  Surgeon: Cherrie Toribio SAUNDERS, MD;  Location: [facility];  Service: Cardiovascular;  Laterality: N/A;   RIGHT/LEFT HEART CATH AND CORONARY ANGIOGRAPHY N/A 04/07/2021   Procedure: RIGHT/LEFT HEART CATH AND CORONARY ANGIOGRAPHY;  Surgeon: Cherrie Toribio SAUNDERS, MD;  Location: [facility];  Service: Cardiovascular;  Laterality: N/A;   FAMILY HISTORY Family History  Problem Relation Age of Onset   Heart disease Mother    Heart attack Mother 69   Hypertension Mother    Hyperlipidemia Mother    Diabetes Mother    Heart disease Father    Heart attack Father 75   Hypertension Father    Hyperlipidemia Father    Diabetes Father    Heart attack Sister 65   Colon cancer Neg Hx    Stomach cancer Neg Hx    Esophageal cancer Neg Hx    Rectal cancer Neg Hx    Colon polyps Neg Hx    Lung disease Neg  Hx    SOCIAL HISTORY Social History   Tobacco Use   Smoking status: Never   Smokeless tobacco: Never  Vaping Use   Vaping status: Never Used  Substance Use Topics   Alcohol use: No   Drug use: No       OPHTHALMIC EXAM:  Not recorded    IMAGING AND PROCEDURES  Imaging and Procedures for 06/12/2024          ASSESSMENT/PLAN:  No diagnosis found.  1,2. Severe Non-proliferative diabetic retinopathy w/ DME, OU - delayed f/u - 9 wks instead of 6 (05.14.25 to 07.17.25)  - delayed f/u - 8 wks instead of 4 (3.17.25 to 05.14.25)  - pt delayed from 4 weeks to 6.5 weeks (01.29.25 to 03.17.25) - pt delayed from 4 weeks to 12 weeks (11.06.24 to 01.29.25) due to being in the hospital multiple times - pt delayed from 4 weeks to 5+ months (04.24.24 to 10.09.24) due to other medical procedures - pt delayed from 4 weeks to 11 months (04.18.22 to 03.27.23) due to other health issues - pt was delayed from 4 weeks to 9 weeks (12.14.21 to 2.21.22) due to financial / insurance reasons  - A1c 12.0 on 09.18.24  - s/p IVA OS #1 (09.16.21),  IVA OD #1 (09.21.21) - s/p IVA OU #2 (10.19.21), #3 (11.16.21), #4 (12.14.21), #5 (02.21.22), #6 (03.21.22), #7 (04.18.22), #8 (03.27.23), #9 (04.24.23), #10 (10.03.24), #11 (11.06.24), #12 (01.29.25), #13 (03.17.25), #14 (05.14.25),  #15 (07.17.25),#16 (08.21.25), #17 (09.25.25), #18 (11.12.25) - exam shows central edema OU, scattered IRH and severe exudates OU - FA (09.16.21) shows late leaking MA greatest posterior pole; no NV; +peripheral vascular perfusion defects -- may benefit from peripheral PRP OU - OCT shows OD: Persistent central edema with interval improvement in IRF and mild interval increase in central IRHM/SRHM; OS: Persistent IRF/IRHM--slightly increased centrally at 6.9 weeks  - BCVA OD 20/250 stable; OS: 20/60 from 20/30  - recommend IVA today OU #19 (11.12.25) w/ f/u in 4-5 wks - pt wishes to proceed with injection - RBA of procedure discussed, questions answered - informed consent obtained and signed - see procedure note - Avastin  informed consent obtained, re-signed and scanned, 01.29.25 (OU) - Pt approved for Eylea but would owe 20%.  - f/u 4-5 weeks, DFE, OCT, possible injection(s)  3,4. Hypertensive retinopathy OU - discussed importance of tight BP control - monitor  5. Mixed Cataract OU - The symptoms of cataract, surgical options, and treatments and risks were discussed with patient. - discussed diagnosis and progression - now following with Dr. Fleeta  Ophthalmic Meds Ordered this visit:  No orders of the defined types were placed in this encounter.    No follow-ups on file.  There are no Patient Instructions on file for this visit.   This document serves as a record of services personally performed by Redell JUDITHANN Hans, MD, PhD. It was created on their behalf by Almetta Pesa, an ophthalmic technician. The creation of this record is the provider's dictation and/or activities during the visit.    Electronically signed by: Almetta Pesa, OA, 06/05/24   10:04 AM    Redell JUDITHANN Hans, M.D., Ph.D. Diseases & Surgery of the Retina and Vitreous [facility]    Abbreviations: M myopia (nearsighted); A astigmatism; H hyperopia (farsighted); P presbyopia; Mrx spectacle prescription;  CTL contact lenses; OD right eye; OS left eye; OU both eyes  XT exotropia; ET esotropia; PEK punctate epithelial keratitis; PEE punctate epithelial erosions; DES  dry eye syndrome; MGD meibomian gland dysfunction; ATs artificial tears; PFAT's preservative free artificial tears; NSC nuclear sclerotic cataract; PSC posterior subcapsular cataract; ERM epi-retinal membrane; PVD posterior vitreous detachment; RD retinal detachment; DM diabetes mellitus; DR diabetic retinopathy; NPDR non-proliferative diabetic retinopathy; PDR proliferative diabetic retinopathy; CSME clinically significant macular edema; DME diabetic macular edema; dbh dot blot hemorrhages; CWS cotton wool spot; POAG primary open angle glaucoma; C/D cup-to-disc ratio; HVF humphrey visual field; GVF goldmann visual field; OCT optical coherence tomography; IOP intraocular pressure; BRVO Branch retinal vein occlusion; CRVO central retinal vein occlusion; CRAO central retinal artery occlusion; BRAO branch retinal artery occlusion; RT retinal tear; SB scleral buckle; PPV pars plana vitrectomy; VH Vitreous hemorrhage; PRP panretinal laser photocoagulation; IVK intravitreal kenalog ; VMT vitreomacular traction; MH Macular hole;  NVD neovascularization of the disc; NVE neovascularization elsewhere; AREDS age related eye disease study; ARMD age related macular degeneration; POAG primary open angle glaucoma; EBMD epithelial/anterior basement membrane dystrophy; ACIOL anterior chamber intraocular lens; IOL intraocular lens; PCIOL posterior chamber intraocular lens; Phaco/IOL phacoemulsification with intraocular lens placement; PRK photorefractive keratectomy; LASIK laser assisted in situ keratomileusis; HTN  hypertension; DM diabetes mellitus; COPD chronic obstructive pulmonary disease

## 2024-06-12 ENCOUNTER — Encounter (INDEPENDENT_AMBULATORY_CARE_PROVIDER_SITE_OTHER): Admitting: Ophthalmology

## 2024-06-12 DIAGNOSIS — E113413 Type 2 diabetes mellitus with severe nonproliferative diabetic retinopathy with macular edema, bilateral: Secondary | ICD-10-CM

## 2024-06-12 DIAGNOSIS — I10 Essential (primary) hypertension: Secondary | ICD-10-CM

## 2024-06-12 DIAGNOSIS — H25813 Combined forms of age-related cataract, bilateral: Secondary | ICD-10-CM

## 2024-06-12 DIAGNOSIS — Z794 Long term (current) use of insulin: Secondary | ICD-10-CM

## 2024-06-12 DIAGNOSIS — H35033 Hypertensive retinopathy, bilateral: Secondary | ICD-10-CM

## 2024-06-13 ENCOUNTER — Ambulatory Visit: Admitting: Podiatry

## 2024-06-18 ENCOUNTER — Telehealth: Payer: Self-pay | Admitting: Cardiovascular Disease

## 2024-06-18 NOTE — Telephone Encounter (Signed)
 I am not needing anything.  [employer] is calling asking for something.  As clinical RNs cannot talk to insurance, forwarded to billing to address.

## 2024-06-18 NOTE — Telephone Encounter (Signed)
 Calling to get patient diagnose code to see if he qualify for their program. Please advise

## 2024-06-19 ENCOUNTER — Encounter (INDEPENDENT_AMBULATORY_CARE_PROVIDER_SITE_OTHER): Admitting: Ophthalmology

## 2024-06-20 NOTE — Telephone Encounter (Signed)
 Insurance is asking for diagnosis to enroll PT in special needs plan. They do not need the diagnosis code. Asking to confirm PT has diabetes and Heart Failure.

## 2024-06-20 NOTE — Telephone Encounter (Signed)
 Spoke with [employer]. Confirmed diagnosis of CHF. Relayed to consultant that they would need to reach out to his PCP for diabetes confirmation. Verbalizes understanding.

## 2024-06-24 ENCOUNTER — Ambulatory Visit: Admitting: Pharmacist

## 2024-06-24 ENCOUNTER — Telehealth: Payer: Self-pay | Admitting: Pharmacist

## 2024-06-24 ENCOUNTER — Ambulatory Visit: Payer: Self-pay | Admitting: Family Medicine

## 2024-06-24 NOTE — Telephone Encounter (Signed)
 Attempted to contact patient for follow-up of missed appointment and request to reschedule.  States glucose has been doing great He apologized for missing appointment and rescheduled for 07/10/2024  Total time with patient call and documentation of interaction: 4 minutes.

## 2024-06-26 ENCOUNTER — Emergency Department (HOSPITAL_COMMUNITY)

## 2024-06-26 ENCOUNTER — Encounter (HOSPITAL_COMMUNITY): Payer: Self-pay

## 2024-06-26 ENCOUNTER — Emergency Department (HOSPITAL_COMMUNITY)
Admission: EM | Admit: 2024-06-26 | Discharge: 2024-06-26 | Disposition: A | Discharge status: Home / Self Care | Attending: Emergency Medicine | Admitting: Emergency Medicine

## 2024-06-26 ENCOUNTER — Other Ambulatory Visit: Payer: Self-pay

## 2024-06-26 DIAGNOSIS — Z7901 Long term (current) use of anticoagulants: Secondary | ICD-10-CM | POA: Insufficient documentation

## 2024-06-26 DIAGNOSIS — R079 Chest pain, unspecified: Secondary | ICD-10-CM

## 2024-06-26 DIAGNOSIS — Z7984 Long term (current) use of oral hypoglycemic drugs: Secondary | ICD-10-CM | POA: Diagnosis not present

## 2024-06-26 DIAGNOSIS — Z794 Long term (current) use of insulin: Secondary | ICD-10-CM | POA: Insufficient documentation

## 2024-06-26 DIAGNOSIS — I482 Chronic atrial fibrillation, unspecified: Secondary | ICD-10-CM | POA: Diagnosis not present

## 2024-06-26 DIAGNOSIS — N189 Chronic kidney disease, unspecified: Secondary | ICD-10-CM | POA: Diagnosis not present

## 2024-06-26 DIAGNOSIS — E1122 Type 2 diabetes mellitus with diabetic chronic kidney disease: Secondary | ICD-10-CM | POA: Diagnosis not present

## 2024-06-26 DIAGNOSIS — I252 Old myocardial infarction: Secondary | ICD-10-CM | POA: Diagnosis not present

## 2024-06-26 DIAGNOSIS — J101 Influenza due to other identified influenza virus with other respiratory manifestations: Secondary | ICD-10-CM | POA: Insufficient documentation

## 2024-06-26 DIAGNOSIS — R0602 Shortness of breath: Secondary | ICD-10-CM | POA: Diagnosis present

## 2024-06-26 DIAGNOSIS — R112 Nausea with vomiting, unspecified: Secondary | ICD-10-CM | POA: Diagnosis present

## 2024-06-26 DIAGNOSIS — R059 Cough, unspecified: Secondary | ICD-10-CM | POA: Diagnosis present

## 2024-06-26 LAB — PRO BRAIN NATRIURETIC PEPTIDE: Pro Brain Natriuretic Peptide: 707 pg/mL — ABNORMAL HIGH

## 2024-06-26 LAB — RESP PANEL BY RT-PCR (RSV, FLU A&B, COVID)  RVPGX2
Influenza A by PCR: POSITIVE — AB
Influenza B by PCR: NEGATIVE
Resp Syncytial Virus by PCR: NEGATIVE
SARS Coronavirus 2 by RT PCR: NEGATIVE

## 2024-06-26 LAB — CBC WITH DIFFERENTIAL/PLATELET
Abs Immature Granulocytes: 0.02 K/uL (ref 0.00–0.07)
Basophils Absolute: 0 K/uL (ref 0.0–0.1)
Basophils Relative: 0 %
Eosinophils Absolute: 0 K/uL (ref 0.0–0.5)
Eosinophils Relative: 1 %
HCT: 43.4 % (ref 39.0–52.0)
Hemoglobin: 14.6 g/dL (ref 13.0–17.0)
Immature Granulocytes: 0 %
Lymphocytes Relative: 15 %
Lymphs Abs: 0.7 K/uL (ref 0.7–4.0)
MCH: 29.7 pg (ref 26.0–34.0)
MCHC: 33.6 g/dL (ref 30.0–36.0)
MCV: 88.2 fL (ref 80.0–100.0)
Monocytes Absolute: 0.4 K/uL (ref 0.1–1.0)
Monocytes Relative: 8 %
Neutro Abs: 3.8 K/uL (ref 1.7–7.7)
Neutrophils Relative %: 76 %
Platelets: 193 K/uL (ref 150–400)
RBC: 4.92 MIL/uL (ref 4.22–5.81)
RDW: 12.6 % (ref 11.5–15.5)
WBC: 5 K/uL (ref 4.0–10.5)
nRBC: 0 % (ref 0.0–0.2)

## 2024-06-26 LAB — COMPREHENSIVE METABOLIC PANEL WITH GFR
ALT: 11 U/L (ref 0–44)
AST: 18 U/L (ref 15–41)
Albumin: 3.9 g/dL (ref 3.5–5.0)
Alkaline Phosphatase: 91 U/L (ref 38–126)
Anion gap: 11 (ref 5–15)
BUN: 14 mg/dL (ref 8–23)
CO2: 25 mmol/L (ref 22–32)
Calcium: 9.2 mg/dL (ref 8.9–10.3)
Chloride: 98 mmol/L (ref 98–111)
Creatinine, Ser: 1.67 mg/dL — ABNORMAL HIGH (ref 0.61–1.24)
GFR, Estimated: 45 mL/min — ABNORMAL LOW
Glucose, Bld: 346 mg/dL — ABNORMAL HIGH (ref 70–99)
Potassium: 4.5 mmol/L (ref 3.5–5.1)
Sodium: 134 mmol/L — ABNORMAL LOW (ref 135–145)
Total Bilirubin: 0.6 mg/dL (ref 0.0–1.2)
Total Protein: 6.6 g/dL (ref 6.5–8.1)

## 2024-06-26 LAB — TROPONIN T, HIGH SENSITIVITY
Troponin T High Sensitivity: 32 ng/L — ABNORMAL HIGH (ref 0–19)
Troponin T High Sensitivity: 22 ng/L — ABNORMAL HIGH (ref 0–19)

## 2024-06-26 LAB — LIPASE, BLOOD: Lipase: 22 U/L (ref 11–51)

## 2024-06-26 MED ORDER — SODIUM CHLORIDE 0.9 % IV BOLUS
1000.0000 mL | Freq: Once | INTRAVENOUS | Status: AC
  Administered 2024-06-26: 1000 mL via INTRAVENOUS

## 2024-06-26 MED ORDER — ONDANSETRON HCL 4 MG/2ML IJ SOLN
4.0000 mg | Freq: Once | INTRAMUSCULAR | Status: AC
  Administered 2024-06-26: 4 mg via INTRAVENOUS
  Filled 2024-06-26: qty 2

## 2024-06-26 MED ORDER — ONDANSETRON HCL 4 MG/2ML IJ SOLN
4.0000 mg | Freq: Once | INTRAMUSCULAR | Status: DC
  Filled 2024-06-26: qty 2

## 2024-06-26 MED ORDER — OSELTAMIVIR PHOSPHATE 75 MG PO CAPS: 75.0000 mg | ORAL_CAPSULE | Freq: Two times a day (BID) | ORAL | 0 refills | Status: AC

## 2024-06-26 MED ORDER — ACETAMINOPHEN 325 MG PO TABS
975.0000 mg | ORAL_TABLET | Freq: Once | ORAL | Status: AC
  Administered 2024-06-26: 975 mg via ORAL
  Filled 2024-06-26: qty 3

## 2024-06-26 NOTE — ED Triage Notes (Signed)
 Pt. Stated, yesterday I started to have a cough, SOB, dizziness, and back pain

## 2024-06-26 NOTE — ED Notes (Signed)
 When you have time, Consuelo Lesches (sister) [phone], would like an update on pt. Thank you

## 2024-06-26 NOTE — ED Provider Notes (Signed)
 " [place] [facility] Provider Note   CSN: [id number] Arrival date & time: 06/26/24  9051     Patient presents with: Dizziness, Cough, Shortness of Breath, Hip Pain, and Back Pain   Calvin [name] is a 63 y.o. male.  With a history of diabetes A-fib on Eliquis  CKD MI who presents to the ED for coughing.  Symptoms yesterday started with coughing chest pain shortness of breath dizziness.  No fevers chills.  No known sick contacts.  No GI symptoms    Dizziness Associated symptoms: shortness of breath   Cough Associated symptoms: shortness of breath   Shortness of Breath Associated symptoms: cough   Hip Pain Associated symptoms include shortness of breath.  Back Pain      Prior to Admission medications  Medication Sig Start Date End Date Taking? Authorizing Provider  oseltamivir  (TAMIFLU ) 75 MG capsule Take 1 capsule (75 mg total) by mouth every 12 (twelve) hours. 06/26/24  Yes Pamella Sharper A, DO  amLODipine (NORVASC) 10 MG tablet Take 10 mg by mouth daily.    [provider]  apixaban  (ELIQUIS ) 5 MG TABS tablet Take 1 tablet (5 mg total) by mouth 2 (two) times daily. 08/21/23   Glena Harlene HERO, FNP  Blood Glucose Monitoring Suppl (ONETOUCH VERIO FLEX SYSTEM) w/Device KIT 1 each by Other route daily. 10/28/21   [provider]  carvedilol  (COREG ) 6.25 MG tablet Take 1 tablet (6.25 mg total) by mouth 2 (two) times daily with a meal. 08/21/23   Milford, Harlene HERO, FNP  Continuous Glucose Sensor (FREESTYLE LIBRE 3 PLUS SENSOR) MISC by Does not apply route. Change sensor every 15 days.    [provider]  dapagliflozin  propanediol (FARXIGA ) 10 MG TABS tablet Take 1 tablet (10 mg total) by mouth every morning. 01/11/24   Darryle Thom CROME, [name]  docusate sodium  (COLACE) 100 MG capsule Take 100 mg by mouth daily as needed for mild constipation.    [provider]  donepezil  (ARICEPT ) 10 MG tablet TAKE ONE TABLET BY  MOUTH EVERYDAY AT BEDTIME 03/23/22   Wertman, Sara E, [name]  Evolocumab  (REPATHA ) 140 MG/ML SOSY Inject 140 mg as directed every 14 (fourteen) days. 01/11/24   Darryle Thom CROME, [name]  ezetimibe  (ZETIA ) 10 MG tablet Take 1 tablet (10 mg total) by mouth every morning. 08/21/23   [place], Harlene HERO, FNP  glipiZIDE (GLUCOTROL) 5 MG tablet Take 5 mg by mouth daily. 12/18/23   [provider]  insulin  glargine (LANTUS ) 100 UNIT/ML injection Inject 40 Units into the skin daily. and 16 units at night    [provider]  insulin  lispro (HUMALOG) 100 UNIT/ML injection Inject 19 Units into the skin 3 (three) times daily before meals.    [provider]  Insulin  Syringe-Needle U-100 (INSULIN  SYRINGE .3CC/29GX1/2) 29G X 1/2 0.3 ML MISC Inject 1 Syringe 3 (three) times daily as directed. Check blood sugar three times daily. Dx: E11.9 05/10/17   Franchot Hough, DO  Lancets [place] DELICA PLUS [place]) MISC USE TWICE DAILY 10/30/19   Kassie Mallick, MD  losartan  (COZAAR ) 25 MG tablet Take 1 tablet (25 mg total) by mouth daily. 08/21/23   [place]ford, Harlene HERO, FNP  pantoprazole  (PROTONIX ) 40 MG tablet TAKE ONE TABLET BY MOUTH EVERY MORNING and TAKE ONE TABLET BY MOUTH EVERYDAY AT BEDTIME 07/13/22   Desai, Nikita S, MD  polyethylene glycol (MIRALAX  / GLYCOLAX ) 17 g packet Take 17 g by mouth 2 (two) times daily. 10/06/20  Avram Lupita BRAVO, MD  rosuvastatin  (CRESTOR ) 40 MG tablet Take 1 tablet (40 mg total) by mouth at bedtime. 08/21/23   [place], Harlene HERO, FNP  Semaglutide, 1 MG/DOSE, (OZEMPIC, 1 MG/DOSE,) 4 MG/3ML SOPN Inject 1 mg into the skin once a week.    [provider]  sildenafil  (VIAGRA ) 100 MG tablet Take 1 tablet (100 mg total) by mouth daily as needed for erectile dysfunction. 09/15/21   Kassie Mallick, MD  spironolactone  (ALDACTONE ) 25 MG tablet Take 1 tablet (25 mg total) by mouth every morning. 08/21/23   Glena Harlene HERO, FNP    Allergies: Metformin     Review of Systems   Respiratory:  Positive for cough and shortness of breath.   Musculoskeletal:  Positive for back pain.  Neurological:  Positive for dizziness.    Updated Vital Signs BP 134/70   Pulse 90   Temp 99.1 F (37.3 C) (Oral)   Resp (!) 21   SpO2 100%   Physical Exam Vitals and nursing note reviewed.  HENT:     Head: Normocephalic and atraumatic.  Eyes:     Pupils: Pupils are equal, round, and reactive to light.  Cardiovascular:     Rate and Rhythm: Normal rate and regular rhythm.  Pulmonary:     Effort: Pulmonary effort is normal.     Breath sounds: Normal breath sounds.  Abdominal:     Palpations: Abdomen is soft.     Tenderness: There is no abdominal tenderness.  Skin:    General: Skin is warm and dry.  Neurological:     Mental Status: He is alert.  Psychiatric:        Mood and Affect: Mood normal.     (all labs ordered are listed, but only abnormal results are displayed) Labs Reviewed  RESP PANEL BY RT-PCR (RSV, FLU A&B, COVID)  RVPGX2 - Abnormal; Notable for the following components:      Result Value   Influenza A by PCR POSITIVE (*)    All other components within normal limits  COMPREHENSIVE METABOLIC PANEL WITH GFR - Abnormal; Notable for the following components:   Sodium 134 (*)    Glucose, Bld 346 (*)    Creatinine, Ser 1.67 (*)    GFR, Estimated 45 (*)    All other components within normal limits  PRO BRAIN NATRIURETIC PEPTIDE - Abnormal; Notable for the following components:   Pro Brain Natriuretic Peptide 707.0 (*)    All other components within normal limits  TROPONIN T, HIGH SENSITIVITY - Abnormal; Notable for the following components:   Troponin T High Sensitivity 32 (*)    All other components within normal limits  TROPONIN T, HIGH SENSITIVITY - Abnormal; Notable for the following components:   Troponin T High Sensitivity 22 (*)    All other components within normal limits  CBC WITH DIFFERENTIAL/PLATELET  LIPASE, BLOOD  CBG MONITORING, ED     EKG: EKG Interpretation Date/Time:  Wednesday June 26 2024 10:51:23 EST Ventricular Rate:  89 PR Interval:  164 QRS Duration:  84 QT Interval:  362 QTC Calculation: 440 R Axis:   -21  Text Interpretation: Normal sinus rhythm ST & T wave abnormality, consider inferolateral ischemia  No significant change from prior Confirmed by Franklyn Gills [phone]) on 06/26/2024 10:54:43 AM  Radiology: [facility] Chest 1 View Result Date: 06/26/2024 CLINICAL DATA:  141880 SOB (shortness of breath) 141880 EXAM: CHEST  1 VIEW COMPARISON:  February seventeenth 2024, October 09, 2023 FINDINGS: The cardiomediastinal silhouette is unchanged  in contour. No pleural effusion. No pneumothorax. No acute pleuroparenchymal abnormality. IMPRESSION: No acute cardiopulmonary abnormality. Electronically Signed   By: Corean Salter M.D.   On: 06/26/2024 10:58     Procedures   Medications Ordered in the ED  ondansetron  (ZOFRAN ) injection 4 mg (4 mg Intravenous Not Given 06/26/24 1550)  sodium chloride  0.9 % bolus 1,000 mL (1,000 mLs Intravenous New Bag/Given 06/26/24 1559)  acetaminophen  (TYLENOL ) tablet 975 mg (975 mg Oral Given 06/26/24 1559)  ondansetron  (ZOFRAN ) injection 4 mg (4 mg Intravenous Given 06/26/24 1559)    Clinical Course as of 06/26/24 2026  Wed Jun 26, 2024  2024 Delta troponin flat.  Patient mains stable on room air.  Appropriate discharge.  Will prescribe Tamiflu  and he will follow-up with PCP.  Return precautions will be worrisome for severe dehydration or worsening respiratory status were discussed in detail hecome back to the ED for. [MP]    Clinical Course User Index [MP] Pamella Ozell LABOR, DO                                 Medical Decision Making 63 year old male with history as above presents to the ED for viral syndrome.  Influenza A positive.  Afebrile normotensive comfortable on room air.  Did report some chest pain as well.  Will obtain laboratory workup including initial troponin  and EKG chest x-ray.  Will provide symptomatic management with IV fluids Zofran  and Tylenol  and reassess.  Suspected remained stable on room air and cardiac workup is negative he may be able to go home on Tamiflu   Risk OTC drugs. Prescription drug management.        Final diagnoses:  Influenza A  Chest pain, unspecified type    ED Discharge Orders          Ordered    oseltamivir  (TAMIFLU ) 75 MG capsule  Every 12 hours        06/26/24 1550               Pamella Ozell LABOR, DO 06/26/24 2026  "

## 2024-06-26 NOTE — ED Provider Triage Note (Signed)
 Emergency Medicine Provider Triage Evaluation Note  Elisah Parmer , a 63 y.o. male  was evaluated in triage.  Pt complains of shortness of breath, cough, chest pain that started this morning while he was lying down.  Associated with nausea and vomiting.  No leg swelling.  Has not taken any medication so far today.  Rates his chest pain at 10 out of 10.  No history of similar. Also a/w nasal congestion and feeling like something is pulling on his sides.  Review of Systems  As above  Physical Exam  BP (!) 100/57 (BP Location: Right Arm)   Pulse 98   Temp 98.4 F (36.9 C)   Resp (!) 25   SpO2 100%  Gen:   Awake, uncomfortable appearing Resp:  Mildly tachypneic without any abnormal lung sounds Chest:  +TTP in his chest wall.  Vascular: Equal BL radial pulses MSK:   Moves extremities without difficulty, no lower extremity edema Other:  Mild diffuse abdominal tenderness  Medical Decision Making  Medically screening exam initiated at 10:31 AM.  Appropriate orders placed.  Fowler Antos was informed that the remainder of the evaluation will be completed by another provider, this initial triage assessment does not replace that evaluation, and the importance of remaining in the ED until their evaluation is complete.  Patient ordered for labs/imaging. Patient will be moved to a treatment space.   Franklyn Sid SAILOR, MD 06/26/24 [phone]

## 2024-06-26 NOTE — Discharge Instructions (Signed)
 You were seen in the emerged department for chest pain cough congestion You tested positive for influenza A (the flu) Your cardiac workup did not show any evidence of heart attack and your chest x-ray looked clear We have called in a prescription for Tamiflu  for you to pick up from your [facility] on [address] and take as directed to shorten the course of the flu Return to the emergency department for trouble breathing severe chest pain or other concerns Otherwise follow-up with your primary doctor within 1 week

## 2024-06-26 NOTE — ED Notes (Signed)
 Light green sent down for delta trop per DO request.

## 2024-07-10 ENCOUNTER — Ambulatory Visit: Admitting: Podiatry

## 2024-07-10 DIAGNOSIS — M2010 Hallux valgus (acquired), unspecified foot: Secondary | ICD-10-CM

## 2024-07-10 DIAGNOSIS — E1149 Type 2 diabetes mellitus with other diabetic neurological complication: Secondary | ICD-10-CM

## 2024-07-10 NOTE — Progress Notes (Signed)
 This patient presents to the office with enlarged callus on both big toes.  He says these are painful walking and wearing his shoes.  He presents to the office for evaluation and treatment.  Vascular  Dorsalis pedis and posterior tibial pulses are palpable  B/L.  Capillary return  WNL.  Temperature gradient is  WNL.  Skin turgor  WNL  Sensorium  Senn Weinstein monofilament wire  WNL. Normal tactile sensation.  Nail Exam  Patient has normal nails with no evidence of bacterial or fungal infection.  Orthopedic  Exam  Muscle tone and muscle strength  WNL.  No limitations of motion feet  B/L.  No crepitus or joint effusion noted.  Foot type is unremarkable .  HAV  B/L.  Skin  No open lesions.  Normal skin texture and turgor.   HAV  B/L.  ROV.  Discussed the painful skin lesion caused by HAV deformity.  RTC prn  Cordella Bold DPM

## 2024-07-12 ENCOUNTER — Encounter: Payer: Self-pay | Admitting: Pharmacist

## 2024-07-12 NOTE — Progress Notes (Signed)
 " [facility] - Clinic Note  07/22/2024     CHIEF COMPLAINT Patient presents for Retina Follow Up   HISTORY OF PRESENT ILLNESS: Calvin [name] is a 64 y.o. male who presents to the clinic today for:  HPI     Retina Follow Up   Patient presents with  Diabetic Retinopathy.  In both eyes.  This started 9 weeks ago.  Duration of 9 weeks.  Since onset it is gradually worsening.  I, the attending physician,  performed the HPI with the patient and updated documentation appropriately.        Comments   9 week retina follow up NPDR pt is reporting decreased vision not as sharp he denies any flashes or floaters pt states last reading 245 this am blood sugars have been up and down        Last edited by Valdemar Rogue, MD on 07/22/2024 10:39 PM.    Patient missed appointment over the holiday due to having the flu. He feels the vision has decreased in the right eye.  Referring physician: Fleeta Gentile MD [address], [address] [place], [place] [address]  HISTORICAL INFORMATION:   Selected notes from the MEDICAL RECORD NUMBER Referred by Dr. Gentile Fleeta LEE: 09/27/21 BCVA OD 20/80 OS 20/60-2 Ocular history: NPDR, Medical history: DM   CURRENT MEDICATIONS: No current outpatient medications on file. (Ophthalmic Drugs)   No current facility-administered medications for this visit. (Ophthalmic Drugs)   Current Outpatient Medications (Other)  Medication Sig   amLODipine (NORVASC) 10 MG tablet Take 10 mg by mouth daily.   apixaban  (ELIQUIS ) 5 MG TABS tablet Take 1 tablet (5 mg total) by mouth 2 (two) times daily.   Blood Glucose Monitoring Suppl (ONETOUCH VERIO FLEX SYSTEM) w/Device KIT 1 each by Other route daily.   carvedilol  (COREG ) 6.25 MG tablet Take 1 tablet (6.25 mg total) by mouth 2 (two) times daily with a meal.   Continuous Glucose Sensor (FREESTYLE LIBRE 3 PLUS SENSOR) MISC by Does not apply route. Change sensor every 15 days.   dapagliflozin   propanediol (FARXIGA ) 10 MG TABS tablet Take 1 tablet (10 mg total) by mouth every morning.   docusate sodium  (COLACE) 100 MG capsule Take 100 mg by mouth daily as needed for mild constipation.   donepezil  (ARICEPT ) 10 MG tablet TAKE ONE TABLET BY MOUTH EVERYDAY AT BEDTIME   Evolocumab  (REPATHA ) 140 MG/ML SOSY Inject 140 mg as directed every 14 (fourteen) days.   ezetimibe  (ZETIA ) 10 MG tablet Take 1 tablet (10 mg total) by mouth every morning.   glipiZIDE (GLUCOTROL) 5 MG tablet Take 5 mg by mouth daily.   insulin  glargine (LANTUS ) 100 UNIT/ML injection Inject 40 Units into the skin daily. and 16 units at night   insulin  lispro (HUMALOG) 100 UNIT/ML injection Inject 19 Units into the skin 3 (three) times daily before meals.   Insulin  Syringe-Needle U-100 (INSULIN  SYRINGE .3CC/29GX1/2) 29G X 1/2 0.3 ML MISC Inject 1 Syringe 3 (three) times daily as directed. Check blood sugar three times daily. Dx: E11.9   Lancets (ONETOUCH DELICA PLUS LANCET30G) MISC USE TWICE DAILY   losartan  (COZAAR ) 25 MG tablet Take 1 tablet (25 mg total) by mouth daily.   oseltamivir  (TAMIFLU ) 75 MG capsule Take 1 capsule (75 mg total) by mouth every 12 (twelve) hours.   pantoprazole  (PROTONIX ) 40 MG tablet TAKE ONE TABLET BY MOUTH EVERY MORNING and TAKE ONE TABLET BY MOUTH EVERYDAY AT BEDTIME   polyethylene glycol (MIRALAX  /  GLYCOLAX ) 17 g packet Take 17 g by mouth 2 (two) times daily.   rosuvastatin  (CRESTOR ) 40 MG tablet Take 1 tablet (40 mg total) by mouth at bedtime.   Semaglutide, 1 MG/DOSE, (OZEMPIC, 1 MG/DOSE,) 4 MG/3ML SOPN Inject 1 mg into the skin once a week.   sildenafil  (VIAGRA ) 100 MG tablet Take 1 tablet (100 mg total) by mouth daily as needed for erectile dysfunction.   spironolactone  (ALDACTONE ) 25 MG tablet Take 1 tablet (25 mg total) by mouth every morning.   No current facility-administered medications for this visit. (Other)   REVIEW OF SYSTEMS: ROS   Positive for: Gastrointestinal, Neurological,  Musculoskeletal, Endocrine, Cardiovascular, Eyes, Respiratory Negative for: Constitutional, Skin, Genitourinary, HENT, Psychiatric, Allergic/Imm, Heme/Lymph Last edited by Resa Delon ORN, COT on 07/22/2024  9:02 AM.       ALLERGIES Allergies  Allergen Reactions   Metformin  Nausea And Vomiting    PAST MEDICAL HISTORY Past Medical History:  Diagnosis Date   Adenoidal hypertrophy    Adrenal nodule 09/21/2020   AF (paroxysmal atrial fibrillation) 03/10/2018   Arthritis    Cataract    Mixed OU   Chronic anticoagulation 10/15/2019   Chronic systolic heart failure 11/22/2015   Colonic ulcer    Coronary artery disease    a. cath in 08/2017 showing mild nonobstructive CAD with scattered 20-30% stenosis.    Diabetic neuropathy 09/26/2017   Diabetic retinopathy    NPDR OU   Essential hypertension 11/22/2015   Foot pain, bilateral 02/01/2019   Gastroesophageal reflux disease 05/10/2017   Heart attack    While living in [place]   History of adenomatous colonic polyps 05/19/2011   05/2011 9 adenomas 08/2017 5 adenomas (dimin) Recall 2022   History of COVID-19 06/18/2019   HLD (hyperlipidemia) 11/20/2015   Insomnia 05/10/2017   Intermittent claudication 05/25/2020   Ischemic colitis    Mild neurocognitive disorder due to Alzheimer's disease, possible 08/24/2021   Nonischemic cardiomyopathy    a. EF 20-25% by echo in 09/2017 with cath showing mild CAD. b.  Last echo 12/2017 EF 35-40%, grade 2 DD.   Obesity    Onychomycosis of multiple toenails with type 2 diabetes mellitus and peripheral neuropathy 05/10/2017   Poorly controlled type 2 diabetes mellitus with circulatory disorder (HCC) 11/20/2015   Right sided weakness 06/17/2019   Stage 3 chronic kidney disease (HCC)    Stage 3a chronic kidney disease (CKD) (HCC) 11/19/2021   Type II diabetes mellitus 11/20/2015   Unsteady gait 12/20/2018   Past Surgical History:  Procedure Laterality Date   BIOPSY  11/22/2021   Procedure:  BIOPSY;  Surgeon: Abran Norleen SAILOR, MD;  Location: [facility];  Service: Gastroenterology;;   COLONOSCOPY  05/13/11, 08/2017   9 adenomas   COLONOSCOPY WITH PROPOFOL  N/A 11/22/2021   Procedure: COLONOSCOPY WITH PROPOFOL ;  Surgeon: Abran Norleen SAILOR, MD;  Location: [facility];  Service: Gastroenterology;  Laterality: N/A;   ESOPHAGOGASTRODUODENOSCOPY (EGD) WITH PROPOFOL  N/A 11/22/2021   Procedure: ESOPHAGOGASTRODUODENOSCOPY (EGD) WITH PROPOFOL ;  Surgeon: Abran Norleen SAILOR, MD;  Location: [facility];  Service: Gastroenterology;  Laterality: N/A;   FOREARM SURGERY     MUSCLE BIOPSY     POLYPECTOMY     POLYPECTOMY  11/22/2021   Procedure: POLYPECTOMY;  Surgeon: Abran Norleen SAILOR, MD;  Location: [facility];  Service: Gastroenterology;;   RIGHT/LEFT HEART CATH AND CORONARY ANGIOGRAPHY N/A 08/25/2017   Procedure: RIGHT/LEFT HEART CATH AND CORONARY ANGIOGRAPHY;  Surgeon: Verlin Lonni BIRCH, MD;  Location: [facility];  Service: Cardiovascular;  Laterality: N/A;   RIGHT/LEFT HEART CATH AND CORONARY ANGIOGRAPHY N/A 10/01/2019   Procedure: RIGHT/LEFT HEART CATH AND CORONARY ANGIOGRAPHY;  Surgeon: Cherrie Toribio SAUNDERS, MD;  Location: [facility];  Service: Cardiovascular;  Laterality: N/A;   RIGHT/LEFT HEART CATH AND CORONARY ANGIOGRAPHY N/A 04/07/2021   Procedure: RIGHT/LEFT HEART CATH AND CORONARY ANGIOGRAPHY;  Surgeon: Cherrie Toribio SAUNDERS, MD;  Location: [facility];  Service: Cardiovascular;  Laterality: N/A;   FAMILY HISTORY Family History  Problem Relation Age of Onset   Heart disease Mother    Heart attack Mother 32   Hypertension Mother    Hyperlipidemia Mother    Diabetes Mother    Heart disease Father    Heart attack Father 37   Hypertension Father    Hyperlipidemia Father    Diabetes Father    Heart attack Sister 73   Colon cancer Neg Hx    Stomach cancer Neg Hx    Esophageal cancer Neg Hx    Rectal cancer Neg Hx    Colon polyps Neg Hx    Lung disease Neg Hx    SOCIAL  HISTORY Social History   Tobacco Use   Smoking status: Never   Smokeless tobacco: Never  Vaping Use   Vaping status: Never Used  Substance Use Topics   Alcohol use: No   Drug use: No       OPHTHALMIC EXAM:  Base Eye Exam     Visual Acuity (Snellen - Linear)       Right Left   Dist [place] CF at 3' 20/40 -1   Dist ph [place] NI NI         Tonometry (Tonopen, 9:06 AM)       Right Left   Pressure 19 18         Pupils       Pupils Dark Light Shape React APD   Right PERRL 3 2 Round Brisk None   Left PERRL 3 2 Round Brisk None         Visual Fields       Left Right    Full Full         Extraocular Movement       Right Left    Full, Ortho Full, Ortho         Neuro/Psych     Oriented x3: Yes   Mood/Affect: Normal         Dilation     Both eyes: 2.5% Phenylephrine  @ 9:06 AM           Slit Lamp and Fundus Exam     Slit Lamp Exam       Right Left   Lids/Lashes Dermatochalasis - upper lid, Meibomian gland dysfunction Dermatochalasis - upper lid, Meibomian gland dysfunction   Conjunctiva/Sclera nasal and temporal pinguecula, Melanosis nasal and temporal pinguecula, Melanosis   Cornea arcus arcus, 2+ inferior Punctate epithelial erosions   Anterior Chamber deep, clear, narrow temporal angle Deep and quiet   Iris Round and dilated, No NVI Round and dilated, No NVI   Lens 2-3+ Nuclear sclerosis, 2-3+ Cortical cataract 2-3+ Nuclear sclerosis, 2-3+ Cortical cataract   Anterior Vitreous Vitreous syneresis, mild Asteroid hyalosis Vitreous syneresis         Fundus Exam       Right Left   Disc Pink and Sharp, no NVD Pink and Sharp, no NVD, Compact   C/D Ratio 0.2 0.2   Macula Blunted foveal reflex, central  edema with diffuse exudates greatest centrally, scattered MA/DBH Blunted foveal reflex, central edema with scattered exudates, scattered MA/DBH   Vessels attenuated, Tortuous, ST venule dilated attenuated, Tortuous   Periphery Attached, scattered  MA/DBH, exudates greatest posteriorly Attached, scattered MA/DBH and exudates greatest posteriorly           IMAGING AND PROCEDURES  Imaging and Procedures for 07/22/2024  OCT, Retina - OU - Both Eyes       Right Eye Quality was good. Central Foveal Thickness: 388. Progression has improved. Findings include no SRF, abnormal foveal contour, subretinal hyper-reflective material, intraretinal hyper-reflective material, intraretinal fluid, vitreomacular adhesion (Persistent central edema with interval improvement in IRF and IRHM ).   Left Eye Quality was good. Central Foveal Thickness: 214. Progression has improved. Findings include no SRF, abnormal foveal contour, intraretinal hyper-reflective material, intraretinal fluid, subretinal fluid (Persistent IRF/IRHM--slightly improved).   Notes *Images captured and stored on drive  Diagnosis / Impression:  Central DME OU OD: Persistent central edema with interval improvement in IRF and IRHM OS: Persistent IRF/IRHM--slightly improved  Clinical management:  See below  Abbreviations: NFP - Normal foveal profile. CME - cystoid macular edema. PED - pigment epithelial detachment. IRF - intraretinal fluid. SRF - subretinal fluid. EZ - ellipsoid zone. ERM - epiretinal membrane. ORA - outer retinal atrophy. ORT - outer retinal tubulation. SRHM - subretinal hyper-reflective material. IRHM - intraretinal hyper-reflective material      Intravitreal Injection, Pharmacologic Agent - OD - Right Eye       Time Out 07/22/2024. 9:36 AM. Confirmed correct patient, procedure, site, and patient consented.   Anesthesia Topical anesthesia was used. Anesthetic medications included Lidocaine  2%, Proparacaine 0.5%.   Procedure Preparation included 5% betadine to ocular surface, eyelid speculum. A (32g) needle was used.   Injection: 1.25 mg Bevacizumab  1.25mg /0.12ml   Route: Intravitreal, Site: Right Eye   NDC: H525437, Lot: [id number], Expiration  date: 09/19/2024   Post-op Post injection exam found visual acuity of at least counting fingers. The patient tolerated the procedure well. There were no complications. The patient received written and verbal post procedure care education. Post injection medications were not given.      Intravitreal Injection, Pharmacologic Agent - OS - Left Eye       Time Out 07/22/2024. 9:37 AM. Confirmed correct patient, procedure, site, and patient consented.   Anesthesia Topical anesthesia was used. Anesthetic medications included Lidocaine  2%, Proparacaine 0.5%.   Procedure Preparation included 5% betadine to ocular surface, eyelid speculum. A (32g) needle was used.   Injection: 1.25 mg Bevacizumab  1.25mg /0.45ml   Route: Intravitreal, Site: Left Eye   NDC: H525437, Lot: [id number], Expiration date: 10/20/2024   Post-op Post injection exam found visual acuity of at least counting fingers. The patient tolerated the procedure well. There were no complications. The patient received written and verbal post procedure care education. Post injection medications were not given.            ASSESSMENT/PLAN:    ICD-10-CM   1. Severe nonproliferative diabetic retinopathy of both eyes with macular edema associated with type 2 diabetes mellitus (HCC)  E11.3413 OCT, Retina - OU - Both Eyes    Intravitreal Injection, Pharmacologic Agent - OD - Right Eye    Intravitreal Injection, Pharmacologic Agent - OS - Left Eye    Bevacizumab  (AVASTIN ) SOLN 1.25 mg    Bevacizumab  (AVASTIN ) SOLN 1.25 mg    2. Current use of insulin  (HCC)  Z79.4     3. Essential hypertension  I10  4. Hypertensive retinopathy of both eyes  H35.033     5. Combined forms of age-related cataract of both eyes  H25.813      1,2. Severe Non-proliferative diabetic retinopathy w/ DME, OU  - delayed f/u - 2+ months instead of 5-6 wks (01.19.26) due to the flu - delayed f/u - 9 wks instead of 6 (05.14.25 to 07.17.25)  - delayed f/u  - 8 wks instead of 4 (3.17.25 to 05.14.25)  - pt delayed from 4 weeks to 6.5 weeks (01.29.25 to 03.17.25) - pt delayed from 4 weeks to 12 weeks (11.06.24 to 01.29.25) due to being in the hospital multiple times - pt delayed from 4 weeks to 5+ months (04.24.24 to 10.09.24) due to other medical procedures - pt delayed from 4 weeks to 11 months (04.18.22 to 03.27.23) due to other health issues - pt was delayed from 4 weeks to 9 weeks (12.14.21 to 2.21.22) due to financial / insurance reasons  - A1c 12.0 on 09.18.24  - s/p IVA OS #1 (09.16.21), IVA OD #1 (09.21.21) - s/p IVA OU #2 (10.19.21), #3 (11.16.21), #4 (12.14.21), #5 (02.21.22), #6 (03.21.22), #7 (04.18.22), #8 (03.27.23), #9 (04.24.23), #10 (10.03.24), #11 (11.06.24), #12 (01.29.25), #13 (03.17.25), #14 (05.14.25), #15 (07.17.25),#16 (08.21.25), #17 (09.25.25), #18 (11.12.25) - exam shows central edema OU, scattered IRH and severe exudates OU - FA (09.16.21) shows late leaking MA greatest posterior pole; no NV; +peripheral vascular perfusion defects -- may benefit from peripheral PRP OU - OCT shows OD: Persistent central edema with interval improvement in IRF and IRHM; OS: Persistent IRF/IRHM--slightly improved at 2+ months  - BCVA OD 20/ CF from 20/250; OS: 20/40 from 20/60 - recommend IVA today OU #19 (01.19.26) w/ f/u in 6 wks - pt wishes to proceed with injection - RBA of procedure discussed, questions answered - informed consent obtained and signed - see procedure note - Avastin  informed consent obtained, re-signed and scanned, 01.19.26 (OU) **discussed decreased efficacy / resistance to Avastin  and the benefit of switching medication to [facility]**   - will check insurance for [facility] - f/u 6 weeks, DFE, OCT, possible injection(s)  3,4. Hypertensive retinopathy OU - discussed importance of tight BP control - monitor   5. Mixed Cataract OU - The symptoms of cataract, surgical options, and treatments and risks were discussed with  patient. - discussed diagnosis and progression - now following with Dr. Fleeta  Ophthalmic Meds Ordered this visit:  Meds ordered this encounter  Medications   Bevacizumab  (AVASTIN ) SOLN 1.25 mg   Bevacizumab  (AVASTIN ) SOLN 1.25 mg     Return in about 4 weeks (around 08/19/2024) for f/u, NPDR, DFE, OCT, Possible, IVA, OU.  There are no Patient Instructions on file for this visit.  This document serves as a record of services personally performed by Redell JUDITHANN Hans, MD, PhD. It was created on their behalf by Paulina Jamse Gay an ophthalmic technician. The creation of this record is the provider's dictation and/or activities during the visit.   Electronically signed by: Alana D Fowler  07/22/24  10:42 PM   This document serves as a record of services personally performed by Redell JUDITHANN Hans, MD, PhD. It was created on their behalf by Wanda GEANNIE Keens, COT an ophthalmic technician. The creation of this record is the provider's dictation and/or activities during the visit.    Electronically signed by:  Wanda GEANNIE Keens, COT  07/22/24 10:42 PM  Redell JUDITHANN Hans, M.D., Ph.D. Diseases & Surgery of the Retina and Vitreous  [facility]  I have reviewed the above documentation for accuracy and completeness, and I agree with the above. Redell JUDITHANN Hans, M.D., Ph.D. 07/22/24 10:45 PM   Abbreviations: M myopia (nearsighted); A astigmatism; H hyperopia (farsighted); P presbyopia; Mrx spectacle prescription;  CTL contact lenses; OD right eye; OS left eye; OU both eyes  XT exotropia; ET esotropia; PEK punctate epithelial keratitis; PEE punctate epithelial erosions; DES dry eye syndrome; MGD meibomian gland dysfunction; ATs artificial tears; PFAT's preservative free artificial tears; NSC nuclear sclerotic cataract; PSC posterior subcapsular cataract; ERM epi-retinal membrane; PVD posterior vitreous detachment; RD retinal detachment; DM diabetes mellitus; DR diabetic retinopathy;  NPDR non-proliferative diabetic retinopathy; PDR proliferative diabetic retinopathy; CSME clinically significant macular edema; DME diabetic macular edema; dbh dot blot hemorrhages; CWS cotton wool spot; POAG primary open angle glaucoma; C/D cup-to-disc ratio; HVF humphrey visual field; GVF goldmann visual field; OCT optical coherence tomography; IOP intraocular pressure; BRVO Branch retinal vein occlusion; CRVO central retinal vein occlusion; CRAO central retinal artery occlusion; BRAO branch retinal artery occlusion; RT retinal tear; SB scleral buckle; PPV pars plana vitrectomy; VH Vitreous hemorrhage; PRP panretinal laser photocoagulation; IVK intravitreal kenalog ; VMT vitreomacular traction; MH Macular hole;  NVD neovascularization of the disc; NVE neovascularization elsewhere; AREDS age related eye disease study; ARMD age related macular degeneration; POAG primary open angle glaucoma; EBMD epithelial/anterior basement membrane dystrophy; ACIOL anterior chamber intraocular lens; IOL intraocular lens; PCIOL posterior chamber intraocular lens; Phaco/IOL phacoemulsification with intraocular lens placement; PRK photorefractive keratectomy; LASIK laser assisted in situ keratomileusis; HTN hypertension; DM diabetes mellitus; COPD chronic obstructive pulmonary disease "

## 2024-07-22 ENCOUNTER — Ambulatory Visit (INDEPENDENT_AMBULATORY_CARE_PROVIDER_SITE_OTHER): Admitting: Ophthalmology

## 2024-07-22 ENCOUNTER — Encounter (INDEPENDENT_AMBULATORY_CARE_PROVIDER_SITE_OTHER): Payer: Self-pay | Admitting: Ophthalmology

## 2024-07-22 DIAGNOSIS — Z794 Long term (current) use of insulin: Secondary | ICD-10-CM

## 2024-07-22 DIAGNOSIS — H25813 Combined forms of age-related cataract, bilateral: Secondary | ICD-10-CM | POA: Diagnosis not present

## 2024-07-22 DIAGNOSIS — I10 Essential (primary) hypertension: Secondary | ICD-10-CM | POA: Diagnosis not present

## 2024-07-22 DIAGNOSIS — E113413 Type 2 diabetes mellitus with severe nonproliferative diabetic retinopathy with macular edema, bilateral: Secondary | ICD-10-CM | POA: Diagnosis not present

## 2024-07-22 DIAGNOSIS — H35033 Hypertensive retinopathy, bilateral: Secondary | ICD-10-CM | POA: Diagnosis not present

## 2024-07-22 MED ORDER — BEVACIZUMAB CHEMO INJECTION 1.25MG/0.05ML SYRINGE FOR KALEIDOSCOPE
1.2500 mg | INTRAVITREAL | Status: AC | PRN
  Administered 2024-07-22: 1.25 mg via INTRAVITREAL

## 2024-07-30 ENCOUNTER — Telehealth: Payer: Self-pay | Admitting: Pharmacist

## 2024-07-30 NOTE — Telephone Encounter (Signed)
 Attempted to contact patient for follow-up of missed appointment and request to reschedule.  Left HIPAA compliant voice mail requesting call back to  Main number to reschedule: [phone]  Total time with patient call and documentation of interaction: 4 minutes.

## 2024-08-04 NOTE — Progress Notes (Unsigned)
 " Cardiology Office Note:  .   Date:  08/07/2024  ID:  Calvin [name], DOB 1961/07/03, MRN [id number] PCP: Stoney Blizzard, DO  [facility] Providers Cardiologist:  Lonni Cash, MD Electrophysiologist:  Darryle ONEIDA Decent, MD  Advanced Heart Failure:  Toribio Fuel, MD   History of Present Illness: .    Chief Complaint  Patient presents with   Chest Pain    Calvin [name] is a 64 y.o. male with below history who presents for follow-up.   History of Present Illness   Calvin [name] is a 64 year old male with nonobstructive CAD, systolic heart failure, diabetes, paroxysmal AFib, and CKD 3B who presents for follow-up.  He has been experiencing chest pain described as 'like needles' or 'like somebody stick me with hands,' occurring for about four weeks. The pain occurs while laying down, lasting about 30 to 35 minutes before resolving on its own, and recurs approximately an hour later. He has not taken any medication like Tylenol  for this pain.  He feels weak and tired, with low energy levels. He experiences shortness of breath and needs to sit down to rest. He denies swelling in his legs.  Regarding his diabetes, he states it is 'somewhat, a little better, but it's still ain't going that well,' indicating his blood sugar levels remain high. He also mentions a lack of appetite, which affects his eating habits.  He is currently taking several medications: Eliquis  5 mg twice a day, carvedilol  6.25 mg twice a day, Farxiga  10 mg daily, losartan  25 mg daily, spironolactone  25 mg daily, Crestor  (rosuvastatin ) 40 mg daily, and Zetia  (ezetimibe ) 10 mg daily. He has not been taking Repatha  as prescribed every two weeks and is running low on some medications.           Problem List Non-obstructive CAD -40% LAD; 40% LCX, 30% RCA 04/07/2021 2. Systolic HF -EF 25-30% 09/11/2023 -EF 30-35% 03/22/2023 -EF 35-40% 02/23/2022 -EF 25-30% 09/11/2023 -CMR negative  03/21/2021 -failed entresto  due to low BP 3. DM -A1c 12.2  4. Paroxysmal Afib 5. HTN 6. CKD 3a     ROS: All other ROS reviewed and negative. Pertinent positives noted in the HPI.     Studies Reviewed: SABRA   EKG Interpretation Date/Time:  Wednesday August 07 2024 08:13:25 EST Ventricular Rate:  92 PR Interval:  178 QRS Duration:  88 QT Interval:  348 QTC Calculation: 430 R Axis:   -32  Text Interpretation: Normal sinus rhythm Left axis deviation T wave abnormality, consider lateral ischemia Confirmed by Decent Darryle [id number]) on 08/07/2024 8:20:57 AM   TTE 09/11/2023  1. Left ventricular ejection fraction, by estimation, is 25 to 30%. The  left ventricle has severely decreased function. The left ventricle  demonstrates global hypokinesis. There is mild concentric left ventricular  hypertrophy. Left ventricular diastolic   parameters are consistent with Grade I diastolic dysfunction (impaired  relaxation). The average left ventricular global longitudinal strain is  -6.1 %. The global longitudinal strain is abnormal.   2. Right ventricular systolic function is normal. The right ventricular  size is normal.   3. The mitral valve is normal in structure. No evidence of mitral valve  regurgitation. No evidence of mitral stenosis.   4. The aortic valve is tricuspid. There is mild calcification of the  aortic valve. Aortic valve regurgitation is not visualized. Aortic valve  sclerosis/calcification is present, without any evidence of aortic  stenosis.   5. The inferior vena cava is normal in size  with greater than 50%  respiratory variability, suggesting right atrial pressure of 3 mmHg.   LHC 04/07/2021   Mid LAD lesion is 40% stenosed.   Prox Cx to Dist Cx lesion is 40% stenosed.   Dist RCA lesion is 30% stenosed.   Prox RCA to Mid RCA lesion is 40% stenosed.   The left ventricular ejection fraction is 55-65% by visual estimate.   Physical Exam:   VS:  BP 120/70 (BP Location:  Left Arm, Patient Position: Sitting, Cuff Size: Normal)   Pulse 92   Ht 5' 6 (1.676 m)   Wt 168 lb (76.2 kg)   BMI 27.12 kg/m    Wt Readings from Last 3 Encounters:  08/07/24 168 lb (76.2 kg)  05/03/24 167 lb 9.6 oz (76 kg)  03/22/24 165 lb (74.8 kg)    GEN: Well nourished, well developed in no acute distress NECK: No JVD; No carotid bruits CARDIAC: RRR, no murmurs, rubs, gallops RESPIRATORY:  Clear to auscultation without rales, wheezing or rhonchi  ABDOMEN: Soft, non-tender, non-distended EXTREMITIES:  No edema; No deformity  ASSESSMENT AND PLAN: .   Assessment and Plan    Chronic heart failure with reduced ejection fraction (EF 25-30%) Chronic heart failure with reduced EF 25-30%. No fluid overload. Low energy and dyspnea likely due to heart failure. Previous Entresto  intolerance due to hypotension. - Ordered echocardiogram to assess heart function. - Continue carvedilol  6.25 mg BID. - Continue losartan  25 mg daily. - Continue spironolactone  25 mg daily. - Continue Farxiga  10 mg daily. - Consider retrial of Entresto  at next visit.  Paroxysmal atrial fibrillation Managed with anticoagulation. - Continue Eliquis  5 mg BID.  Nonobstructive coronary artery disease Recent chest pain not believed cardiac. Stress test planned to rule out cardiac causes. - Ordered stress test to evaluate cardiac function.  Type 2 diabetes mellitus with hyperglycemia Type 2 diabetes with poor glycemic control, A1c 12.2. Reports lack of appetite complicating management. - Ordered A1c test. - Encouraged compliance with diabetes medications.  Chronic kidney disease stage 3b Chronic kidney disease stage 3b. - Ordered comprehensive metabolic panel (CMP) to assess kidney function.  Mixed hyperlipidemia Managed with statins and PCSK9 inhibitor. Reports non-compliance with Repatha  injections. - Ordered lipid panel. - Continue rosuvastatin  40 mg daily. - Continue ezetimibe  10 mg daily. -  Encouraged compliance with Repatha  injections every two weeks.  Precordial chest pain, likely musculoskeletal Intermittent sharp, needle-like precordial chest pain at rest. Likely musculoskeletal. - Recommended Tylenol  as needed for pain relief.                Follow-up: Return in about 3 months (around 11/04/2024).  Signed, Darryle DASEN. Barbaraann, MD, [facility]  [facility]  [address] [place], [place] [address] [phone]  9:26 AM   "

## 2024-08-07 ENCOUNTER — Ambulatory Visit: Admitting: Cardiovascular Disease

## 2024-08-07 ENCOUNTER — Other Ambulatory Visit (HOSPITAL_COMMUNITY): Payer: Self-pay

## 2024-08-07 VITALS — BP 120/70 | HR 92 | Ht 66.0 in | Wt 168.0 lb

## 2024-08-07 DIAGNOSIS — I2510 Atherosclerotic heart disease of native coronary artery without angina pectoris: Secondary | ICD-10-CM | POA: Diagnosis not present

## 2024-08-07 DIAGNOSIS — K219 Gastro-esophageal reflux disease without esophagitis: Secondary | ICD-10-CM | POA: Diagnosis not present

## 2024-08-07 DIAGNOSIS — R072 Precordial pain: Secondary | ICD-10-CM

## 2024-08-07 DIAGNOSIS — E782 Mixed hyperlipidemia: Secondary | ICD-10-CM

## 2024-08-07 DIAGNOSIS — I480 Paroxysmal atrial fibrillation: Secondary | ICD-10-CM

## 2024-08-07 DIAGNOSIS — I5022 Chronic systolic (congestive) heart failure: Secondary | ICD-10-CM | POA: Diagnosis not present

## 2024-08-07 MED ORDER — SPIRONOLACTONE 25 MG PO TABS
25.0000 mg | ORAL_TABLET | Freq: Every morning | ORAL | 3 refills | Status: AC
  Filled 2024-08-07: qty 90, 90d supply, fill #0

## 2024-08-07 MED ORDER — LOSARTAN POTASSIUM 25 MG PO TABS
25.0000 mg | ORAL_TABLET | Freq: Every day | ORAL | 3 refills | Status: AC
  Filled 2024-08-07: qty 90, 90d supply, fill #0

## 2024-08-07 MED ORDER — REPATHA SURECLICK 140 MG/ML ~~LOC~~ SOAJ
140.0000 mg | SUBCUTANEOUS | 2 refills | Status: AC
  Filled 2024-08-07: qty 2, 28d supply, fill #0

## 2024-08-07 MED ORDER — ROSUVASTATIN CALCIUM 40 MG PO TABS
40.0000 mg | ORAL_TABLET | Freq: Every day | ORAL | 3 refills | Status: AC
  Filled 2024-08-07: qty 90, 90d supply, fill #0

## 2024-08-07 MED ORDER — EZETIMIBE 10 MG PO TABS
10.0000 mg | ORAL_TABLET | Freq: Every morning | ORAL | 3 refills | Status: AC
  Filled 2024-08-07: qty 90, 90d supply, fill #0

## 2024-08-07 MED ORDER — CARVEDILOL 6.25 MG PO TABS
6.2500 mg | ORAL_TABLET | Freq: Two times a day (BID) | ORAL | 3 refills | Status: AC
  Filled 2024-08-07: qty 180, 90d supply, fill #0

## 2024-08-07 MED ORDER — DAPAGLIFLOZIN PROPANEDIOL 10 MG PO TABS
10.0000 mg | ORAL_TABLET | Freq: Every morning | ORAL | 3 refills | Status: AC
  Filled 2024-08-07: qty 90, 90d supply, fill #0

## 2024-08-07 MED ORDER — PANTOPRAZOLE SODIUM 40 MG PO TBEC
40.0000 mg | DELAYED_RELEASE_TABLET | Freq: Every day | ORAL | 5 refills | Status: AC
  Filled 2024-08-07: qty 60, 60d supply, fill #0

## 2024-08-07 MED ORDER — APIXABAN 5 MG PO TABS
5.0000 mg | ORAL_TABLET | Freq: Two times a day (BID) | ORAL | 5 refills | Status: AC
  Filled 2024-08-07: qty 60, 30d supply, fill #0

## 2024-08-07 MED ORDER — REPATHA 140 MG/ML ~~LOC~~ SOSY
140.0000 mg | PREFILLED_SYRINGE | SUBCUTANEOUS | 3 refills | Status: DC
  Filled 2024-08-07: qty 6, 84d supply, fill #0

## 2024-08-07 NOTE — Patient Instructions (Addendum)
 Medication Instructions:  Your physician recommends that you continue on your current medications as directed. Please refer to the Current Medication list given to you today.  *If you need a refill on your cardiac medications before your next appointment, please call your pharmacy*  Lab Work: CMP, CBC, Lipid Panel, A1C, TSH today If you have labs (blood work) drawn today and your tests are completely normal, you will receive your results only by: MyChart Message (if you have MyChart) OR A paper copy in the mail If you have any lab test that is abnormal or we need to change your treatment, we will call you to review the results.  Testing/Procedures: Myoview  Cardiac Perfusion-Lexiscan   Echocardiogram  Follow-Up: At [facility], you and your health needs are our priority.  As part of our continuing mission to provide you with exceptional heart care, our providers are all part of one team.  This team includes your primary Cardiologist (physician) and Advanced Practice Providers or APPs (Physician Assistants and Nurse Practitioners) who all work together to provide you with the care you need, when you need it.  Your next appointment:   3 month(s)  Provider:   Darryle Decent, MD    We recommend signing up for the patient portal called MyChart.  Sign up information is provided on this After Visit Summary.  MyChart is used to connect with patients for Virtual Visits (Telemedicine).  Patients are able to view lab/test results, encounter notes, upcoming appointments, etc.  Non-urgent messages can be sent to your provider as well.   To learn more about what you can do with MyChart, go to [email or web address].   Other Instructions  ECHOCARDIOGRAM  Your physician has requested that you have an echocardiogram. Echocardiography is a painless test that uses sound waves to create images of your heart. It provides your doctor with information about the size and shape of your heart and  how well your hearts chambers and valves are working. This procedure takes approximately one hour. There are no restrictions for this procedure. Please do NOT wear cologne, perfume, aftershave, or lotions (deodorant is allowed). Please arrive 15 minutes prior to your appointment time.  Please note: We ask at that you not bring children with you during ultrasound (echo/ vascular) testing. Due to room size and safety concerns, children are not allowed in the ultrasound rooms during exams. Our front office staff cannot provide observation of children in our lobby area while testing is being conducted. An adult accompanying a patient to their appointment will only be allowed in the ultrasound room at the discretion of the ultrasound technician under special circumstances. We apologize for any inconvenience.   LEXISCAN   You are scheduled for a Myocardial Perfusion Imaging Study on:  ____ at ____.  Please arrive 15 minutes prior to your appointment time for registration and insurance purposes.  The test will take approximately 3 to 4 hours to complete; you may bring reading material.  If someone comes with you to your appointment, they will need to remain in the main lobby due to limited space in the testing area. **If you are pregnant or breastfeeding, please notify the nuclear lab prior to your appointment**  How to prepare for your Myocardial Perfusion Test: Do not eat or drink 3 hours prior to your test, except you may have water . Do not consume products containing caffeine (regular or decaffeinated) 12 hours prior to your test. (ex: coffee, chocolate, sodas, tea). Do bring a list of your current medications with you.  If not listed below, you may take your medications as normal. Do wear comfortable clothes (no dresses or overalls) and walking shoes, tennis shoes preferred (No heels or open toe shoes are allowed). Do NOT wear cologne, perfume, aftershave, or lotions (deodorant is allowed). If these  instructions are not followed, your test will have to be rescheduled.

## 2024-08-08 ENCOUNTER — Ambulatory Visit: Payer: Self-pay | Admitting: Cardiovascular Disease

## 2024-08-08 LAB — LIPID PANEL
Chol/HDL Ratio: 4.7 ratio (ref 0.0–5.0)
Cholesterol, Total: 282 mg/dL — ABNORMAL HIGH (ref 100–199)
HDL: 60 mg/dL
LDL Chol Calc (NIH): 192 mg/dL — ABNORMAL HIGH (ref 0–99)
Triglycerides: 165 mg/dL — ABNORMAL HIGH (ref 0–149)
VLDL Cholesterol Cal: 30 mg/dL (ref 5–40)

## 2024-08-08 LAB — CBC
Hematocrit: 47.3 % (ref 37.5–51.0)
Hemoglobin: 15.1 g/dL (ref 13.0–17.7)
MCH: 29.6 pg (ref 26.6–33.0)
MCHC: 31.9 g/dL (ref 31.5–35.7)
MCV: 93 fL (ref 79–97)
Platelets: 256 10*3/uL (ref 150–450)
RBC: 5.1 x10E6/uL (ref 4.14–5.80)
RDW: 12.8 % (ref 11.6–15.4)
WBC: 5.3 10*3/uL (ref 3.4–10.8)

## 2024-08-08 LAB — COMPREHENSIVE METABOLIC PANEL WITH GFR
ALT: 13 [IU]/L (ref 0–44)
AST: 16 [IU]/L (ref 0–40)
Albumin: 4.2 g/dL (ref 3.9–4.9)
Alkaline Phosphatase: 111 [IU]/L (ref 47–123)
BUN/Creatinine Ratio: 8 — ABNORMAL LOW (ref 10–24)
BUN: 15 mg/dL (ref 8–27)
Bilirubin Total: 0.4 mg/dL (ref 0.0–1.2)
CO2: 23 mmol/L (ref 20–29)
Calcium: 9.6 mg/dL (ref 8.6–10.2)
Chloride: 97 mmol/L (ref 96–106)
Creatinine, Ser: 1.79 mg/dL — ABNORMAL HIGH (ref 0.76–1.27)
Globulin, Total: 2.8 g/dL (ref 1.5–4.5)
Glucose: 383 mg/dL — ABNORMAL HIGH (ref 70–99)
Potassium: 4.8 mmol/L (ref 3.5–5.2)
Sodium: 135 mmol/L (ref 134–144)
Total Protein: 7 g/dL (ref 6.0–8.5)
eGFR: 42 mL/min/{1.73_m2} — ABNORMAL LOW

## 2024-08-08 LAB — HEMOGLOBIN A1C
Est. average glucose Bld gHb Est-mCnc: 326 mg/dL
Hgb A1c MFr Bld: 13 % — ABNORMAL HIGH (ref 4.8–5.6)

## 2024-08-08 LAB — TSH: TSH: 1.81 u[IU]/mL (ref 0.450–4.500)

## 2024-08-19 ENCOUNTER — Encounter (INDEPENDENT_AMBULATORY_CARE_PROVIDER_SITE_OTHER): Admitting: Ophthalmology

## 2024-08-19 DIAGNOSIS — H25813 Combined forms of age-related cataract, bilateral: Secondary | ICD-10-CM

## 2024-08-19 DIAGNOSIS — I10 Essential (primary) hypertension: Secondary | ICD-10-CM

## 2024-08-19 DIAGNOSIS — E113413 Type 2 diabetes mellitus with severe nonproliferative diabetic retinopathy with macular edema, bilateral: Secondary | ICD-10-CM

## 2024-08-19 DIAGNOSIS — H35033 Hypertensive retinopathy, bilateral: Secondary | ICD-10-CM

## 2024-08-19 DIAGNOSIS — Z794 Long term (current) use of insulin: Secondary | ICD-10-CM

## 2024-09-16 ENCOUNTER — Encounter

## 2024-10-08 ENCOUNTER — Ambulatory Visit: Admitting: Podiatry

## 2024-10-09 ENCOUNTER — Ambulatory Visit (HOSPITAL_COMMUNITY)

## 2024-10-09 ENCOUNTER — Encounter (HOSPITAL_COMMUNITY)

## 2024-11-04 ENCOUNTER — Ambulatory Visit: Admitting: Cardiovascular Disease
